# Patient Record
Sex: Female | Born: 1935 | Race: Black or African American | Hispanic: No | Smoking: Never smoker
Health system: Southern US, Community
[De-identification: ages and names within clinical notes are randomized; demographics above are authoritative.]

## PROBLEM LIST (undated history)

## (undated) DIAGNOSIS — D473 Essential (hemorrhagic) thrombocythemia: Secondary | ICD-10-CM

## (undated) DIAGNOSIS — E0590 Thyrotoxicosis, unspecified without thyrotoxic crisis or storm: Secondary | ICD-10-CM

## (undated) DIAGNOSIS — I10 Essential (primary) hypertension: Secondary | ICD-10-CM

## (undated) DIAGNOSIS — F4310 Post-traumatic stress disorder, unspecified: Secondary | ICD-10-CM

## (undated) DIAGNOSIS — G4700 Insomnia, unspecified: Secondary | ICD-10-CM

## (undated) DIAGNOSIS — E669 Obesity, unspecified: Secondary | ICD-10-CM

## (undated) DIAGNOSIS — G43909 Migraine, unspecified, not intractable, without status migrainosus: Secondary | ICD-10-CM

## (undated) DIAGNOSIS — I2699 Other pulmonary embolism without acute cor pulmonale: Secondary | ICD-10-CM

## (undated) DIAGNOSIS — K5790 Diverticulosis of intestine, part unspecified, without perforation or abscess without bleeding: Secondary | ICD-10-CM

## (undated) DIAGNOSIS — M1990 Unspecified osteoarthritis, unspecified site: Secondary | ICD-10-CM

## (undated) DIAGNOSIS — I639 Cerebral infarction, unspecified: Secondary | ICD-10-CM

## (undated) DIAGNOSIS — M129 Arthropathy, unspecified: Secondary | ICD-10-CM

## (undated) HISTORY — PX: TONSILLECTOMY: SHX5217

## (undated) HISTORY — DX: Diverticulosis of intestine, part unspecified, without perforation or abscess without bleeding: K57.90

## (undated) HISTORY — DX: Arthropathy, unspecified: M12.9

## (undated) HISTORY — DX: Obesity, unspecified: E66.9

## (undated) HISTORY — DX: Insomnia, unspecified: G47.00

## (undated) HISTORY — DX: Essential (primary) hypertension: I10

## (undated) HISTORY — PX: BREAST SURGERY: SHX581

## (undated) HISTORY — DX: Thyrotoxicosis, unspecified without thyrotoxic crisis or storm: E05.90

## (undated) HISTORY — DX: Post-traumatic stress disorder, unspecified: F43.10

## (undated) HISTORY — DX: Other pulmonary embolism without acute cor pulmonale: I26.99

## (undated) HISTORY — DX: Migraine, unspecified, not intractable, without status migrainosus: G43.909

## (undated) HISTORY — DX: Cerebral infarction, unspecified: I63.9

---

## 1974-01-05 HISTORY — PX: ABDOMINAL HYSTERECTOMY: SHX81

## 1996-01-06 HISTORY — PX: TOTAL HIP ARTHROPLASTY: SHX124

## 2007-01-06 LAB — HM MAMMOGRAPHY: HM Mammogram: NORMAL

## 2008-02-27 ENCOUNTER — Emergency Department (HOSPITAL_COMMUNITY): Admission: EM | Admit: 2008-02-27 | Discharge: 2008-02-27 | Payer: Self-pay | Admitting: Emergency Medicine

## 2008-04-20 ENCOUNTER — Ambulatory Visit: Payer: Self-pay | Admitting: Internal Medicine

## 2008-04-20 DIAGNOSIS — G43909 Migraine, unspecified, not intractable, without status migrainosus: Secondary | ICD-10-CM | POA: Insufficient documentation

## 2008-04-20 DIAGNOSIS — I10 Essential (primary) hypertension: Secondary | ICD-10-CM | POA: Insufficient documentation

## 2008-04-21 DIAGNOSIS — E669 Obesity, unspecified: Secondary | ICD-10-CM | POA: Insufficient documentation

## 2008-04-21 DIAGNOSIS — M1990 Unspecified osteoarthritis, unspecified site: Secondary | ICD-10-CM | POA: Insufficient documentation

## 2008-06-06 ENCOUNTER — Ambulatory Visit: Payer: Self-pay | Admitting: Internal Medicine

## 2008-06-19 ENCOUNTER — Telehealth: Payer: Self-pay | Admitting: Internal Medicine

## 2008-06-20 ENCOUNTER — Ambulatory Visit: Payer: Self-pay | Admitting: Internal Medicine

## 2008-06-20 LAB — HM COLONOSCOPY

## 2008-08-16 ENCOUNTER — Ambulatory Visit: Payer: Self-pay | Admitting: Internal Medicine

## 2008-08-22 ENCOUNTER — Ambulatory Visit: Payer: Self-pay | Admitting: Internal Medicine

## 2008-08-22 DIAGNOSIS — F4310 Post-traumatic stress disorder, unspecified: Secondary | ICD-10-CM | POA: Insufficient documentation

## 2008-08-23 LAB — CONVERTED CEMR LAB
ALT: 11 units/L (ref 0–35)
AST: 13 units/L (ref 0–37)
Albumin: 3.6 g/dL (ref 3.5–5.2)
Alkaline Phosphatase: 61 units/L (ref 39–117)
BUN: 19 mg/dL (ref 6–23)
Basophils Absolute: 0 10*3/uL (ref 0.0–0.1)
Basophils Relative: 0.3 % (ref 0.0–3.0)
Bilirubin Urine: NEGATIVE
Bilirubin, Direct: 0.1 mg/dL (ref 0.0–0.3)
CO2: 32 meq/L (ref 19–32)
Calcium: 9.3 mg/dL (ref 8.4–10.5)
Chloride: 105 meq/L (ref 96–112)
Cholesterol: 132 mg/dL (ref 0–200)
Creatinine, Ser: 0.6 mg/dL (ref 0.4–1.2)
Eosinophils Absolute: 0.2 10*3/uL (ref 0.0–0.7)
Eosinophils Relative: 4.2 % (ref 0.0–5.0)
GFR calc non Af Amer: 125.68 mL/min (ref >60)
Glucose, Bld: 88 mg/dL (ref 70–99)
HCT: 40.3 % (ref 36.0–46.0)
HDL: 39.2 mg/dL (ref >39.00)
Hemoglobin: 13.4 g/dL (ref 12.0–15.0)
Ketones, ur: NEGATIVE mg/dL
LDL Cholesterol: 86 mg/dL (ref 0–99)
Lymphocytes Relative: 24.5 % (ref 12.0–46.0)
Lymphs Abs: 1.2 10*3/uL (ref 0.7–4.0)
MCHC: 33.2 g/dL (ref 30.0–36.0)
MCV: 90.7 fL (ref 78.0–100.0)
Monocytes Absolute: 0.4 10*3/uL (ref 0.1–1.0)
Monocytes Relative: 8.9 % (ref 3.0–12.0)
Neutro Abs: 3.1 10*3/uL (ref 1.4–7.7)
Neutrophils Relative %: 62.1 % (ref 43.0–77.0)
Nitrite: POSITIVE
Platelets: 289 10*3/uL (ref 150.0–400.0)
Potassium: 4 meq/L (ref 3.5–5.1)
RBC: 4.45 M/uL (ref 3.87–5.11)
RDW: 13.9 % (ref 11.5–14.6)
Sodium: 142 meq/L (ref 135–145)
Specific Gravity, Urine: 1.025 (ref 1.000–1.030)
TSH: 0.53 microintl units/mL (ref 0.35–5.50)
Total Bilirubin: 0.6 mg/dL (ref 0.3–1.2)
Total CHOL/HDL Ratio: 3
Total Protein, Urine: NEGATIVE mg/dL
Total Protein: 6.6 g/dL (ref 6.0–8.3)
Triglycerides: 34 mg/dL (ref 0.0–149.0)
Urine Glucose: NEGATIVE mg/dL
Urobilinogen, UA: 0.2 (ref 0.0–1.0)
VLDL: 6.8 mg/dL (ref 0.0–40.0)
WBC: 4.9 10*3/uL (ref 4.5–10.5)
pH: 5.5 (ref 5.0–8.0)

## 2008-09-04 ENCOUNTER — Ambulatory Visit: Payer: Self-pay | Admitting: Licensed Clinical Social Worker

## 2009-03-25 ENCOUNTER — Ambulatory Visit: Payer: Self-pay | Admitting: Internal Medicine

## 2009-03-25 LAB — CONVERTED CEMR LAB
Bilirubin Urine: NEGATIVE
Hemoglobin, Urine: NEGATIVE
Ketones, ur: NEGATIVE mg/dL
Nitrite: POSITIVE
Specific Gravity, Urine: >=1.03 (ref 1.000–1.030)
Total Protein, Urine: NEGATIVE mg/dL
Urine Glucose: NEGATIVE mg/dL
Urobilinogen, UA: 0.2 (ref 0.0–1.0)
pH: 5.5 (ref 5.0–8.0)

## 2009-04-30 ENCOUNTER — Telehealth: Payer: Self-pay | Admitting: Internal Medicine

## 2009-07-03 ENCOUNTER — Ambulatory Visit: Payer: Self-pay | Admitting: Internal Medicine

## 2009-07-03 DIAGNOSIS — G4700 Insomnia, unspecified: Secondary | ICD-10-CM | POA: Insufficient documentation

## 2009-09-02 ENCOUNTER — Ambulatory Visit: Payer: Self-pay | Admitting: Internal Medicine

## 2009-09-03 LAB — CONVERTED CEMR LAB
ALT: 11 units/L (ref 0–35)
AST: 13 units/L (ref 0–37)
Albumin: 3.7 g/dL (ref 3.5–5.2)
Alkaline Phosphatase: 57 units/L (ref 39–117)
BUN: 17 mg/dL (ref 6–23)
Basophils Absolute: 0.1 10*3/uL (ref 0.0–0.1)
Basophils Relative: 1.4 % (ref 0.0–3.0)
Bilirubin Urine: NEGATIVE
Bilirubin, Direct: 0.1 mg/dL (ref 0.0–0.3)
CO2: 32 meq/L (ref 19–32)
Calcium: 9.5 mg/dL (ref 8.4–10.5)
Chloride: 103 meq/L (ref 96–112)
Creatinine, Ser: 0.8 mg/dL (ref 0.4–1.2)
Eosinophils Absolute: 0.2 10*3/uL (ref 0.0–0.7)
Eosinophils Relative: 3.2 % (ref 0.0–5.0)
GFR calc non Af Amer: 96.87 mL/min (ref >60)
Glucose, Bld: 89 mg/dL (ref 70–99)
HCT: 38.3 % (ref 36.0–46.0)
Hemoglobin, Urine: NEGATIVE
Hemoglobin: 13 g/dL (ref 12.0–15.0)
Ketones, ur: NEGATIVE mg/dL
Lymphocytes Relative: 25.6 % (ref 12.0–46.0)
Lymphs Abs: 1.3 10*3/uL (ref 0.7–4.0)
MCHC: 33.9 g/dL (ref 30.0–36.0)
MCV: 91.7 fL (ref 78.0–100.0)
Monocytes Absolute: 0.4 10*3/uL (ref 0.1–1.0)
Monocytes Relative: 8.5 % (ref 3.0–12.0)
Neutro Abs: 3.2 10*3/uL (ref 1.4–7.7)
Neutrophils Relative %: 61.3 % (ref 43.0–77.0)
Nitrite: NEGATIVE
Platelets: 325 10*3/uL (ref 150.0–400.0)
Potassium: 3.8 meq/L (ref 3.5–5.1)
RBC: 4.18 M/uL (ref 3.87–5.11)
RDW: 15.4 % — ABNORMAL HIGH (ref 11.5–14.6)
Sodium: 143 meq/L (ref 135–145)
Specific Gravity, Urine: >=1.03 (ref 1.000–1.030)
TSH: 0.3 microintl units/mL — ABNORMAL LOW (ref 0.35–5.50)
Total Bilirubin: 0.7 mg/dL (ref 0.3–1.2)
Total Protein, Urine: NEGATIVE mg/dL
Total Protein: 6.7 g/dL (ref 6.0–8.3)
Urine Glucose: NEGATIVE mg/dL
Urobilinogen, UA: 0.2 (ref 0.0–1.0)
WBC: 5.2 10*3/uL (ref 4.5–10.5)
pH: 5 (ref 5.0–8.0)

## 2009-11-06 ENCOUNTER — Ambulatory Visit: Payer: Self-pay | Admitting: Internal Medicine

## 2009-11-07 LAB — CONVERTED CEMR LAB
BUN: 17 mg/dL (ref 6–23)
Basophils Absolute: 0.1 10*3/uL (ref 0.0–0.1)
Basophils Relative: 1.3 % (ref 0.0–3.0)
CO2: 34 meq/L — ABNORMAL HIGH (ref 19–32)
Calcium: 9.8 mg/dL (ref 8.4–10.5)
Chloride: 101 meq/L (ref 96–112)
Creatinine, Ser: 0.6 mg/dL (ref 0.4–1.2)
Eosinophils Absolute: 0.2 10*3/uL (ref 0.0–0.7)
Eosinophils Relative: 4.4 % (ref 0.0–5.0)
GFR calc non Af Amer: 135.64 mL/min (ref >60)
Glucose, Bld: 90 mg/dL (ref 70–99)
HCT: 39.1 % (ref 36.0–46.0)
Hemoglobin: 13.2 g/dL (ref 12.0–15.0)
Lymphocytes Relative: 24.7 % (ref 12.0–46.0)
Lymphs Abs: 1.2 10*3/uL (ref 0.7–4.0)
MCHC: 33.7 g/dL (ref 30.0–36.0)
MCV: 91 fL (ref 78.0–100.0)
Monocytes Absolute: 0.4 10*3/uL (ref 0.1–1.0)
Monocytes Relative: 7.8 % (ref 3.0–12.0)
Neutro Abs: 2.9 10*3/uL (ref 1.4–7.7)
Neutrophils Relative %: 61.8 % (ref 43.0–77.0)
Platelets: 375 10*3/uL (ref 150.0–400.0)
Potassium: 4.3 meq/L (ref 3.5–5.1)
RBC: 4.3 M/uL (ref 3.87–5.11)
RDW: 14.7 % — ABNORMAL HIGH (ref 11.5–14.6)
Sodium: 141 meq/L (ref 135–145)
TSH: 0.25 microintl units/mL — ABNORMAL LOW (ref 0.35–5.50)
WBC: 4.7 10*3/uL (ref 4.5–10.5)

## 2009-11-08 ENCOUNTER — Ambulatory Visit: Payer: Self-pay | Admitting: Cardiology

## 2009-11-11 ENCOUNTER — Ambulatory Visit: Payer: Self-pay | Admitting: Endocrinology

## 2009-11-11 DIAGNOSIS — E0590 Thyrotoxicosis, unspecified without thyrotoxic crisis or storm: Secondary | ICD-10-CM | POA: Insufficient documentation

## 2009-11-19 ENCOUNTER — Encounter: Admission: RE | Admit: 2009-11-19 | Discharge: 2009-11-19 | Payer: Self-pay | Admitting: Endocrinology

## 2010-01-13 ENCOUNTER — Telehealth: Payer: Self-pay | Admitting: Internal Medicine

## 2010-01-13 ENCOUNTER — Ambulatory Visit
Admission: RE | Admit: 2010-01-13 | Discharge: 2010-01-13 | Payer: Self-pay | Source: Home / Self Care | Attending: Internal Medicine | Admitting: Internal Medicine

## 2010-01-15 ENCOUNTER — Telehealth: Payer: Self-pay | Admitting: Internal Medicine

## 2010-01-21 ENCOUNTER — Encounter: Payer: Self-pay | Admitting: Internal Medicine
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
## 2010-02-04 NOTE — Assessment & Plan Note (Signed)
Summary: abd pain/#/cd   Vital Signs:  Patient profile:   77 year old female Height:      64 inches (162.56 cm) Weight:      189.0 pounds (85.91 kg) O2 Sat:      95 % on Room air Temp:     97.9 degrees F (36.61 degrees C) oral Pulse rate:   72 / minute BP sitting:   118 / 74  (left arm) Cuff size:   large  Vitals Entered By: Orlan Leavens RMA (September 02, 2009 1:54 PM)  O2 Flow:  Room air CC: abdominal pain, Abdominal pain Is Patient Diabetic? No Pain Assessment Patient in pain? yes     Location: abdomen Type: aching Comments Also want to discuss trazodone & Pennsaid   Primary Care Provider:  Newt Lukes MD  CC:  abdominal pain and Abdominal pain.  History of Present Illness: here for followup  Osteoarthritis - still with R knee pain also R hip and shoulder pain still seeing dr. Jorge Mandril for same - used topical voltaren gel but has run out - ?pennsaid per friend ?if too much glucosamine can hurt her- intol of relefen trial, ?resume meloxicam as this did not cause stomach upset  obesity - still trying to lose weight but say exercise is hard because of knee and hio oain feels like she is eating less that usual b/c heat/summer but no weight loss  HTN - no problems with her medications - no CP, edema, HA or vision changes - 100% complaint  insomnia - improved with trial amitriptyline and/or trazodone - uses every 2-3 nights as needed - sometimes with "hangover"  Abdominal Pain      This is a 77 year old woman who presents with Abdominal pain.  The symptoms began 2 days ago.  On a scale of 1 to 10, the intensity is described as a 3.  seen for similar pain symptoms 03/2009 -.  The patient reports constipation, but denies nausea, vomiting, diarrhea, hematochezia, and hematemesis.  The location of the pain is right lower quadrant and left lower quadrant.  The pain is described as intermittent and dull.  The patient denies the following symptoms: fever, weight loss, dysuria,  chest pain, and jaundice.  The pain is worse with food.  The pain is better with defecation.    Clinical Review Panels:  Prevention   Last Mammogram:  normal (01/06/2007)   Last Colonoscopy:  Location:  [facility].  (06/20/2008)  CBC   WBC:  4.9 (08/16/2008)   RBC:  4.45 (08/16/2008)   Hgb:  13.4 (08/16/2008)   Hct:  40.3 (08/16/2008)   Platelets:  289.0 (08/16/2008)   MCV  90.7 (08/16/2008)   MCHC  33.2 (08/16/2008)   RDW  13.9 (08/16/2008)   PMN:  62.1 (08/16/2008)   Lymphs:  24.5 (08/16/2008)   Monos:  8.9 (08/16/2008)   Eosinophils:  4.2 (08/16/2008)   Basophil:  0.3 (08/16/2008)  Complete Metabolic Panel   Glucose:  88 (08/16/2008)   Sodium:  142 (08/16/2008)   Potassium:  4.0 (08/16/2008)   Chloride:  105 (08/16/2008)   CO2:  32 (08/16/2008)   BUN:  19 (08/16/2008)   Creatinine:  0.6 (08/16/2008)   Albumin:  3.6 (08/16/2008)   Total Protein:  6.6 (08/16/2008)   Calcium:  9.3 (08/16/2008)   Total Bili:  0.6 (08/16/2008)   Alk Phos:  61 (08/16/2008)   SGPT (ALT):  11 (08/16/2008)   SGOT (AST):  13 (08/16/2008)  Current Medications (verified): 1)  Glucosamine Chondr 500 Complex  Caps (Glucosamine-Chondroit-Vit C-Mn) .... Take 1 Two Times A Day 2)  Hydrochlorothiazide 25 Mg Tabs (Hydrochlorothiazide) .... Take 1 By Mouth Qd 3)  Propranolol Hcl 20 Mg Tabs (Propranolol Hcl) .... Take 1 Two Times A Day 4)  Meloxicam 7.5 Mg Tabs (Meloxicam) .[name].. 1 By Mouth Two Times A Day 5)  Amitriptyline Hcl 10 Mg Tabs (Amitriptyline Hcl) .[name].. 1 By Mouth At Bedtime 6)  Trazodone Hcl 50 Mg Tabs (Trazodone Hcl) .... 1/2 -1 By Mouth At As Needed For Sleep 7)  Pennsaid 1.5 % Soln (Diclofenac Sodium) .... Apply Few Drops To Affected Joints (Knee) Four Times A Day As Needed For Pain  Allergies (verified): No Known Drug Allergies  Past History:  Past Medical History: Hypertension Osteoarthritis    MD roster: sports med - Jorge Mandril  Past Surgical History: Total hip  replacement, right (1998)  Hysterectomy (1976) Tonsillectomy Breast biopsy   Review of Systems  The patient denies fever, weight loss, chest pain, dyspnea on exertion, peripheral edema, melena, and severe indigestion/heartburn.    Physical Exam  General:  alert, well-developed, well-nourished, and cooperative to examination.    Lungs:  normal respiratory effort, no intercostal retractions or use of accessory muscles; normal breath sounds bilaterally - no crackles and no wheezes.    Heart:  normal rate, regular rhythm, no murmur, and no rub. BLE without edema. Abdomen:  obese, soft, non-tender, normal bowel sounds, no distention; no masses and no appreciable hepatomegaly or splenomegaly.   Msk:  R knee with no effusion, not warm or tender to touch. crepitus but FROM. R shoulder: Full range of motion. full strength with testing rotator cuff. Negative impingement signs. mildly tender to palpation over deltoid insertion. Neurovascularly intact  Neurologic:  alert & oriented X3 and cranial nerves II-XII symetrically intact.  strength normal in all extremities, sensation intact to light touch, and gait normal. speech fluent without dysarthria or aphasia; follows commands with good comprehension.    Impression & Recommendations:  Problem # 1:  ABDOMINAL PAIN, GENERALIZED (ICD-789.07)  pain resolved today - exac by food - ?biliary or ischemic - check labs now - if unremarkable, refer for CT and/or angio   Orders: TLB-BMP (Basic Metabolic Panel-BMET) (80048-METABOL) TLB-CBC Platelet - w/Differential (85025-CBCD) TLB-Hepatic/Liver Function Pnl (80076-HEPATIC) TLB-TSH (Thyroid Stimulating Hormone) (84443-TSH) TLB-Udip w/ Micro (81001-URINE)  Problem # 2:  INSOMNIA (ICD-780.52)  continue low dose trazadone - prev intol of elavil  Problem # 3:  OSTEOARTHRITIS (ICD-715.90)  Her updated medication list for this problem includes:    Meloxicam 7.5 Mg Tabs (Meloxicam) .[name]... 1 by mouth two times  a day  affects knee, hip and shoulder resume meloxicam and increase to max allowed dose - also given sample of topical NSAID (pennsaid) to try - cont to follow also with sports med doc Hilts as ongong as needed   Complete Medication List: 1)  Glucosamine Chondr 500 Complex Caps (Glucosamine-chondroit-vit c-mn) .... Take 1 two times a day 2)  Hydrochlorothiazide 25 Mg Tabs (Hydrochlorothiazide) .... Take 1 by mouth qd 3)  Propranolol Hcl 20 Mg Tabs (Propranolol hcl) .... Take 1 two times a day 4)  Meloxicam 7.5 Mg Tabs (Meloxicam) .[name].. 1 by mouth two times a day 5)  Trazodone Hcl 50 Mg Tabs (Trazodone hcl) .[name].. 1 by mouth after dinner as needed for sleep 6)  Pennsaid 1.5 % Soln (Diclofenac sodium) .... Apply few drops to affected joints (knee) four times a day as  needed for pain  Patient Instructions: 1)  it was good to see you today. 2)  test(s) ordered today - your results will be posted on the phone tree for review in 48-72 hours from the time of test completion; call [phone] and enter your 9 digit MRN (listed above on this page, just below your name); if any changes need to be made or there are abnormal results, you will be contacted directly. 3)  continue meloxicam to 15mg /day (2 - 7.5mg  tabs daily) for arthritis pain - also ok to use Pennsaid drops as discussed - sample given - let us know if presciption needed - followup with dr. Jorge Mandril as planned 4)  try trazodone for sleep - take after dinner 5)  Please schedule a follow-up appointment in 4-6 months, sooner if problems.

## 2010-02-04 NOTE — Progress Notes (Signed)
Summary: COL prep ?'s  Phone Note Call from Patient Call back at Home Phone [phone]   Caller: Patient Call For: Dr. Marina Goodell Reason for Call: Talk to Nurse Details for Reason: COL prep ?'s Summary of Call: pt has questions regarding the time she should start her prep Initial call taken by: Vallarie Mare,  June 19, 2008 10:52 AM  Follow-up for Phone Call        Pt. had questions regarding her prep times.  Wanted to double check regarding when to start today and tomorrow's time. She has been informed, and stated that she would see [place] tomorrow. Follow-up by: Clide Cliff RN,  June 19, 2008 11:19 AM

## 2010-02-04 NOTE — Assessment & Plan Note (Signed)
Summary: discuss bp,arthritis/Cd   Vital Signs:  Patient profile:   77 year old female Height:      64 inches (162.56 cm) Weight:      190.4 pounds (86.55 kg) O2 Sat:      99 % on Room air Temp:     97.7 degrees F (36.50 degrees C) oral Pulse rate:   71 / minute BP sitting:   120 / 76  (left arm) Cuff size:   large  Vitals Entered By: Orlan Leavens (July 03, 2009 10:10 AM)  O2 Flow:  Room air CC: Arthritis Is Patient Diabetic? No Pain Assessment Patient in pain? yes     Location: knees Type: aching Comments Pt states she want to discuss med. Was given Nabumetone 500 1-2 two times a day had to stop taking med due to side effects. was making her whole bodyache. also wan to see if md would replace amitriptyline to something else.    Primary Care Provider:  Newt Lukes MD  CC:  Arthritis.  History of Present Illness: her for followup  Osteoarthritis - still with R knee pain now also R hipand shoulder pain still seeing dr. Jorge Mandril for same - used topical voltaren gel but has run out - ?pennsaid per friend ?if too much glucosamine can hurt her- intol of relefen trial, ?resume meloxicam as this did not cause stomach upset  obesity - still trying to lose weight but say exercise is hard because of knee and hio oain feels like she is eating less that usual b/c heat/summer but no weight loss  HTN - no problems with her medications - no CP, edema, HA or vision changes - 100% complaint  insomnia - improved with trial amitriptyline - uses every 2-3 nights as needed - sometimes with "hangover" - ?alt med to try  Current Medications (verified): 1)  Glucosamine Chondr 500 Complex  Caps (Glucosamine-Chondroit-Vit C-Mn) .... Take 1 Two Times A Day 2)  Hydrochlorothiazide 25 Mg Tabs (Hydrochlorothiazide) .... Take 1 By Mouth Qd 3)  Propranolol Hcl 20 Mg Tabs (Propranolol Hcl) .... Take 1 Two Times A Day 4)  Meloxicam 7.5 Mg Tabs (Meloxicam) .... Take 1 By Mouth Qd 5)   Amitriptyline Hcl 10 Mg Tabs (Amitriptyline Hcl) .[name].. 1 By Mouth At Bedtime  Allergies (verified): No Known Drug Allergies  Past History:  Past Medical History: Hypertension Osteoarthritis  MD roster: sports med - Hiltz  Review of Systems  The patient denies fever, chest pain, syncope, and peripheral edema.    Physical Exam  General:  alert, well-developed, well-nourished, and cooperative to examination.    Lungs:  normal respiratory effort, no intercostal retractions or use of accessory muscles; normal breath sounds bilaterally - no crackles and no wheezes.    Heart:  normal rate, regular rhythm, no murmur, and no rub. BLE without edema. Msk:  R knee with no effusion, not warm or tender to touch. crepitus but FROM.   Impression & Recommendations:  Problem # 1:  OSTEOARTHRITIS (ICD-715.90)  resume meloxicam and increase to max allowed dose - also given sample of topical NSAID (pennsaid) to try - cont to follow also with sports med doc Hilts as ongong as needed  The following medications were removed from the medication list:    Hydrocodone-acetaminophen 10-325 Mg Tabs (Hydrocodone-acetaminophen) .[name]... Take 1 q 4-6hours as needed    Arthrotec 75-200 Mg-mcg Tabs (Diclofenac-misoprostol) .[name]... Take 1 two times a day Her updated medication list for this problem includes:  Meloxicam 7.5 Mg Tabs (Meloxicam) .[name]... 1 by mouth two times a day  Orders: Prescription Created Electronically [phone])  Problem # 2:  HYPERTENSION (ICD-401.9) Assessment: Unchanged  Her updated medication list for this problem includes:    Hydrochlorothiazide 25 Mg Tabs (Hydrochlorothiazide) .[name]... Take 1 by mouth qd    Propranolol Hcl 20 Mg Tabs (Propranolol hcl) .[name]... Take 1 two times a day  BP today: 120/76 Prior BP: 112/82 (03/25/2009)  Labs Reviewed: K+: 4.0 (08/16/2008) Creat: : 0.6 (08/16/2008)   Chol: 132 (08/16/2008)   HDL: 39.20 (08/16/2008)   LDL: 86 (08/16/2008)   TG: 34.0  (08/16/2008)  Problem # 3:  INSOMNIA (ICD-780.52)  try low dose trazadone in place of elavil as needed - new erx done  Discussed sleep hygiene.   Orders: Prescription Created Electronically ([phone])  Complete Medication List: 1)  Glucosamine Chondr 500 Complex Caps (Glucosamine-chondroit-vit c-mn) .... Take 1 two times a day 2)  Hydrochlorothiazide 25 Mg Tabs (Hydrochlorothiazide) .... Take 1 by mouth qd 3)  Propranolol Hcl 20 Mg Tabs (Propranolol hcl) .... Take 1 two times a day 4)  Meloxicam 7.5 Mg Tabs (Meloxicam) .[name].. 1 by mouth two times a day 5)  Amitriptyline Hcl 10 Mg Tabs (Amitriptyline hcl) .[name].. 1 by mouth at bedtime 6)  Trazodone Hcl 50 Mg Tabs (Trazodone hcl) .... 1/2 -1 by mouth at as needed for sleep 7)  Pennsaid 1.5 % Soln (Diclofenac sodium) .... Apply few drops to affected joints (knee) four times a day as needed for pain  Patient Instructions: 1)  it was good to see you today. 2)  increase meloxicam to 15mg /day (2 - 7.5mg  tabs daily) for arthritis pain - 3)  also ok to try Pennsaid drops on knee as discussed - sample given - let us know if presciption needed 4)  try trazodone for sleep in place of amitriptyline -  5)  your prescriptions have been electronically submitted to your pharmacy. Please take as directed. Contact our office if you believe you're having problems with the medication(s).  6)  Please schedule a follow-up appointment in 4-6 months, sooner if problems.  Prescriptions: TRAZODONE HCL 50 MG TABS (TRAZODONE HCL) 1/2 -1 by mouth at as needed for sleep  #30 x 1   Entered and Authorized by:   Newt Lukes MD   Signed by:   Newt Lukes MD on 07/03/2009   Method used:   [facility]        [facility] [address] [phone]* (retail)       [address]       [place], [place]  [address]       Ph: [phone] or [phone]       Fax: [phone]   RxID:   [id number] MELOXICAM 7.5 MG TABS (MELOXICAM) 1 by mouth  two times a day  #60 x 1   Entered and Authorized by:   Newt Lukes MD   Signed by:   Newt Lukes MD on 07/03/2009   Method used:   [facility]ically to        [facility] [address] [phone]* (retail)       [address]       [place], [place]  [address]       Ph: [phone] or [phone]       Fax: [phone]   RxID:   [id number]

## 2010-02-04 NOTE — Progress Notes (Signed)
Summary: HCTZ & propanolol  Phone Note Refill Request Message from:  Fax from Pharmacy on April 30, 2009 10:10 AM  Refills Requested: Medication #1:  HYDROCHLOROTHIAZIDE 25 MG TABS take 1 by mouth qd   Last Refilled: 04/08/2009  Medication #2:  PROPRANOLOL HCL 20 MG TABS take 1 two times a day   Last Refilled: 03/27/2009  Method Requested: Electronic Initial call taken by: Orlan Leavens,  April 30, 2009 10:10 AM    Prescriptions: PROPRANOLOL HCL 20 MG TABS (PROPRANOLOL HCL) take 1 two times a day  #60 x 5   Entered by:   Orlan Leavens   Authorized by:   Newt Lukes MD   Signed by:   Orlan Leavens on 04/30/2009   Method used:   Electronically to        [facility] [address] [phone]* (retail)       [address]       [place], [place]  [address]       Ph: [phone] or [phone]       Fax: [phone]   RxID:   [id number] HYDROCHLOROTHIAZIDE 25 MG TABS (HYDROCHLOROTHIAZIDE) take 1 by mouth qd  #30 x 5   Entered by:   Orlan Leavens   Authorized by:   Newt Lukes MD   Signed by:   Orlan Leavens on 04/30/2009   Method used:   [facility]ronically to        [facility] [address] [phone]* (retail)       [address]       [place], [place]  [address]       Ph: [phone] or [phone]       Fax: [phone]   RxID:   [id number]

## 2010-02-04 NOTE — Assessment & Plan Note (Signed)
Summary: NEW ENDO HYPERTHYROID MEDICARE PT-PER LUCY/DR VL-#--STC   Vital Signs:  Patient profile:   77 year old female Height:      64 inches (162.56 cm) Weight:      187 pounds (85 kg) BMI:     32.21 O2 Sat:      97 % on Room air Temp:     98.5 degrees F (36.94 degrees C) oral Pulse rate:   70 / minute BP sitting:   122 / 72  (left arm) Cuff size:   large  Vitals Entered By: [name] CMA Duncan Dull) (November 11, 2009 2:22 PM)  O2 Flow:  Room air CC: New Endo/Hyperthyroid/Dr. Leschber/aj Is Patient Diabetic? No   Primary Provider:  Newt Lukes MD  CC:  New Endo/Hyperthyroid/Dr. Leschber/aj.  History of Present Illness: pt was incidentally noted on labs to have abnormal tsh.   in retrospect, she has few mos of slight pain in the abdomen, but no assoc n/v.  Current Medications (verified): 1)  Glucosamine Chondr 500 Complex  Caps (Glucosamine-Chondroit-Vit C-Mn) .... Take 1 Two Times A Day 2)  Hydrochlorothiazide 25 Mg Tabs (Hydrochlorothiazide) .... Take 1 By Mouth Qd 3)  Propranolol Hcl 20 Mg Tabs (Propranolol Hcl) .... Take 1 Two Times A Day 4)  Meloxicam 7.5 Mg Tabs (Meloxicam) .[name].. 1 By Mouth Two Times A Day 5)  Trazodone Hcl 50 Mg Tabs (Trazodone Hcl) .[name].. 1 By Mouth After Dinner As Needed For Sleep 6)  Align 4 Mg Caps (Probiotic Product) .... Take 1 By Mouth Once Daily  Allergies (verified): No Known Drug Allergies  Past History:  Past Medical History: Last updated: 11/06/2009 Hypertension Osteoarthritis     MD roster: sports med - [facility]  Review of Systems       denies weight loss, headache, hoarseness, double vision, palpitations, sob, diarrhea, polyuria, excessive diaphoresis, numbness, tremor, anxiety, menopausal sxs, and rhinorrhea.  she has chronic arthralgias, and easy bruising.     Physical Exam  General:  normal appearance.   Head:  head: no deformity eyes: no periorbital swelling, but there is slight bilateral proptosis. external nose  and ears are normal mouth: no lesion seen Neck:  the thyroid is 2-3x normal size, and larger on the right than the left.  i am unable to appreciaate a nodule. Lungs:  Clear to auscultation bilaterally. Normal respiratory effort.  Heart:  Regular rate and rhythm without murmurs or gallops noted. Normal S1,S2.   Msk:  muscle bulk and strength are grossly normal.  no obvious joint swelling.  gait is normal and steady  Extremities:  no deformity, except for oa changes no edema Neurologic:  cn 2-12 grossly intact.   readily moves all 4's.   sensation is intact to touch on all 4's Skin:  normal texture and temp.  no rash is seen.  not diaphoretic  Cervical Nodes:  No significant adenopathy.  Psych:  Alert and cooperative; normal mood and affect; normal attention span and concentration.   Additional Exam:  FastTSH       0.25 uIU/mL    Impression & Recommendations:  Problem # 1:  HYPERTHYROIDISM (ICD-242.90) uncertain etiology mild  Problem # 2:  ABDOMINAL PAIN, GENERALIZED (ICD-789.07) we need to allow time for the pt to excrete the iodine from the ct contrast prior to any nuc med dx or rx  Problem # 3:  POSTTRAUMATIC STRESS DISORDER (ICD-309.81) this could limit interpretation of sxs, but she is doing well in this regard now  Other Orders: Radiology Referral (Radiology) Est. Patient Level V (16109)  Patient Instructions: 1)  check thyroid ultrasound.  please call [phone] to hear your test results. 2)  Please schedule a follow-up appointment in 2-3 months.  this is because we need to allow time for the iodine from your ct scan to leave your body.  then we'll be able to treat the thyroid, usually with a radioactive iodine pill.   this would be your choice, but it is what i would do if i was you.     Orders Added: 1)  Radiology Referral [Radiology] 2)  Est. Patient Level V [81191]

## 2010-02-04 NOTE — Assessment & Plan Note (Signed)
Summary: stomach pains/cd   Vital Signs:  Patient profile:   77 year old female Height:      64 inches (162.56 cm) Weight:      190.0 pounds (86.36 kg) BMI:     32.73 O2 Sat:      96 % on Room air Temp:     98.3 degrees F (36.83 degrees C) oral Pulse rate:   74 / minute BP sitting:   112 / 82  (left arm) Cuff size:   large  Vitals Entered By: Orlan Leavens (March 25, 2009 11:16 AM)  O2 Flow:  Room air CC: stomach cramps, Abdominal pain Is Patient Diabetic? No Pain Assessment Patient in pain? yes     Location: stomach Type: cramping   Primary Care Provider:  Newt Lukes MD  CC:  stomach cramps and Abdominal pain.  History of Present Illness:  Abdominal Pain      This is a 77 year old woman who presents with Abdominal pain.  The symptoms began 4-8 weeks ago.  On a scale of 1 to 10, the intensity is described as a 4.  occurs once every 3-4 days, lasts from 5-30 minutes per spell.  The patient reports constipation, but denies nausea, vomiting, melena, hematochezia, and anorexia.  The location of the pain is diffuse.  The pain is described as intermittent and cramping in quality.  The patient denies the following symptoms: fever, weight loss, and dysuria.  The pain is worse with food.  The pain is better with defecation.    still with R knee pain now also R hip pain still seeing dr. Jorge Mandril for same - using topical voltaren gel and as needed hydrocodone 10 for same symptoms ?if too much glucosm  obesity - still trying to lose weight but say exercise is hard because of knee and hio oain feels like she is eating less that usual b/c heat/summer but no weight loss  HTN - no problems with her medications - no CP or HA or vision changes - 100% complaint  insomnia improved with trial amitriptyline - uses every 2-3 nights as needed   Clinical Review Panels:  Prevention   Last Mammogram:  normal (01/06/2007)   Last Colonoscopy:  Location:  [facility].   (06/20/2008)  Lipid Management   Cholesterol:  132 (08/16/2008)   LDL (bad choesterol):  86 (08/16/2008)   HDL (good cholesterol):  39.20 (08/16/2008)  CBC   WBC:  4.9 (08/16/2008)   RBC:  4.45 (08/16/2008)   Hgb:  13.4 (08/16/2008)   Hct:  40.3 (08/16/2008)   Platelets:  289.0 (08/16/2008)   MCV  90.7 (08/16/2008)   MCHC  33.2 (08/16/2008)   RDW  13.9 (08/16/2008)   PMN:  62.1 (08/16/2008)   Lymphs:  24.5 (08/16/2008)   Monos:  8.9 (08/16/2008)   Eosinophils:  4.2 (08/16/2008)   Basophil:  0.3 (08/16/2008)  Complete Metabolic Panel   Glucose:  88 (08/16/2008)   Sodium:  142 (08/16/2008)   Potassium:  4.0 (08/16/2008)   Chloride:  105 (08/16/2008)   CO2:  32 (08/16/2008)   BUN:  19 (08/16/2008)   Creatinine:  0.6 (08/16/2008)   Albumin:  3.6 (08/16/2008)   Total Protein:  6.6 (08/16/2008)   Calcium:  9.3 (08/16/2008)   Total Bili:  0.6 (08/16/2008)   Alk Phos:  61 (08/16/2008)   SGPT (ALT):  11 (08/16/2008)   SGOT (AST):  13 (08/16/2008)   Current Medications (verified): 1)  Glucosamine Chondr 500 Complex  Caps (Glucosamine-Chondroit-Vit C-Mn) .... Take 1 Two Times A Day 2)  Hydrochlorothiazide 25 Mg Tabs (Hydrochlorothiazide) .... Take 1 By Mouth Qd 3)  Propranolol Hcl 20 Mg Tabs (Propranolol Hcl) .... Take 1 Two Times A Day 4)  Meloxicam 7.5 Mg Tabs (Meloxicam) .... Take 1 By Mouth Qd 5)  Hydrocodone-Acetaminophen 10-325 Mg Tabs (Hydrocodone-Acetaminophen) .... Take 1 Q 4-6hours As Needed 6)  Amitriptyline Hcl 10 Mg Tabs (Amitriptyline Hcl) .[name].. 1 By Mouth At Bedtime 7)  Arthrotec 75-200 Mg-Mcg Tabs (Diclofenac-Misoprostol) .... Take 1 Two Times A Day  Allergies (verified): No Known Drug Allergies  Past History:  Past Medical History: Hypertension Osteoarthritis  MD rooster: sports med - [facility]  Review of Systems  The patient denies anorexia, weight loss, chest pain, headaches, melena, hematochezia, and severe indigestion/heartburn.    Physical  Exam  General:  alert, well-developed, well-nourished, and cooperative to examination.    Lungs:  normal respiratory effort, no intercostal retractions or use of accessory muscles; normal breath sounds bilaterally - no crackles and no wheezes.    Heart:  normal rate, regular rhythm, no murmur, and no rub. BLE without edema. Abdomen:  obese, soft, non-tender, normal bowel sounds, no distention; no masses and no appreciable hepatomegaly or splenomegaly.     Impression & Recommendations:  Problem # 1:  ABDOMINAL PAIN, GENERALIZED (ICD-789.07)  exam benign - ?related to IBS - nontoxic, afeb - pt declines blood work - will check UA/Ucx given hx UTI - tx if + also add probiotic -  education an dreassurance provided - Time spent with patient 27 minutes, more than 50% of this time was spent counseling patient on causes for abd pain and its treatment options Orders: TLB-Udip w/ Micro (81001-URINE) T-Urine Culture (Spectrum Order) [phone])  Discussed symptom control with the patient.   Problem # 2:  HYPERTENSION (ICD-401.9)  Her updated medication list for this problem includes:    Hydrochlorothiazide 25 Mg Tabs (Hydrochlorothiazide) .[name]... Take 1 by mouth qd    Propranolol Hcl 20 Mg Tabs (Propranolol hcl) .[name]... Take 1 two times a day  BP today: 112/82 Prior BP: 112/82 (08/22/2008)  Labs Reviewed: K+: 4.0 (08/16/2008) Creat: : 0.6 (08/16/2008)   Chol: 132 (08/16/2008)   HDL: 39.20 (08/16/2008)   LDL: 86 (08/16/2008)   TG: 34.0 (08/16/2008)  Problem # 3:  ARTHRITIS (ICD-716.90) cont mgmt as ongoin with [facility] - rreassurance re: glucosamine saftey - trial arthrotech  Complete Medication List: 1)  Glucosamine Chondr 500 Complex Caps (Glucosamine-chondroit-vit c-mn) .... Take 1 two times a day 2)  Hydrochlorothiazide 25 Mg Tabs (Hydrochlorothiazide) .... Take 1 by mouth qd 3)  Propranolol Hcl 20 Mg Tabs (Propranolol hcl) .... Take 1 two times a day 4)  Meloxicam 7.5 Mg Tabs  (Meloxicam) .... Take 1 by mouth qd 5)  Hydrocodone-acetaminophen 10-325 Mg Tabs (Hydrocodone-acetaminophen) .... Take 1 q 4-6hours as needed 6)  Amitriptyline Hcl 10 Mg Tabs (Amitriptyline hcl) .[name].. 1 by mouth at bedtime 7)  Arthrotec 75-200 Mg-mcg Tabs (Diclofenac-misoprostol) .... Take 1 two times a day 8)  Align Caps (Probiotic product) .[name].. 1 by mouth once daily  Patient Instructions: 1)  it was good to see you today. 2)  test(s) ordered today - your results will be posted on the phone tree for review in 48-72 hours from the time of test completion; call [phone] and enter your 9 digit MRN (listed above on this page, just below your name); if any changes need to be made or there are abnormal results,  you will be contacted directly.  3)  start Align once daily for your stomach  -coupon given 4)  Please schedule a follow-up appointment in 4-6 months, sooner if problems.

## 2010-02-04 NOTE — Assessment & Plan Note (Signed)
Summary: 4-6 MTH FU---STC   Vital Signs:  Patient profile:   76 year old female Height:      64 inches (162.56 cm) Weight:      186.8 pounds (84.91 kg) O2 Sat:      96 % on Room air Temp:     98.1 degrees F (36.72 degrees C) oral Pulse rate:   74 / minute BP sitting:   120 / 82  (left arm) Cuff size:   large  Vitals Entered By: Orlan Leavens RMA (November 06, 2009 10:45 AM)  O2 Flow:  Room air CC: 4 month follow-up Is Patient Diabetic? No Pain Assessment Patient in pain? yes     Location: stomach Type: aching/cramping   Primary Care Provider:  Newt Lukes MD  CC:  4 month follow-up.  History of Present Illness: here for followup  Osteoarthritis - still with R knee pain, R hip and shoulder pain still seeing dr. Jorge Mandril for same - recent shot "no good" prev used topical voltaren gel but ran out - now on pennsaid as needed  ?if too much glucosamine can hurt her- intol of relefen trial, taking meloxicam without stomach upset  obesity - still trying to lose weight but say exercise is hard because of knee and hip pain feels like she is eating less that usual b/c stomach pain but no weight loss  HTN - no problems with her medications - no CP, edema, HA or vision changes - 100% complaint  insomnia - improved with trial amitriptyline and/or trazodone - uses every 2-3 nights as needed - sometimes with "hangover"  continued Abdominal Pain - seen 09/02/2009 for same -The symptoms began >2 months ago.  On a scale of 1 to 10, the intensity is described as a 5 when present, only 2 now (resolving attack from this AM).  seen for similar pain symptoms 03/2009 -  The patient reports constipation, but denies nausea, vomiting, diarrhea, hematochezia, and hematemesis.  The location of the pain is right lower quadrant and left lower quadrant and epigastric region.  The pain is described as intermittent, cramping and dull.  The patient denies the following symptoms: fever, weight loss,  dysuria, chest pain, and jaundice.  The pain is worse with food.  The pain is better with defecation.     Clinical Review Panels:  Lipid Management   Cholesterol:  132 (08/16/2008)   LDL (bad choesterol):  86 (08/16/2008)   HDL (good cholesterol):  39.20 (08/16/2008)  CBC   WBC:  5.2 (09/02/2009)   RBC:  4.18 (09/02/2009)   Hgb:  13.0 (09/02/2009)   Hct:  38.3 (09/02/2009)   Platelets:  325.0 (09/02/2009)   MCV  91.7 (09/02/2009)   MCHC  33.9 (09/02/2009)   RDW  15.4 (09/02/2009)   PMN:  61.3 (09/02/2009)   Lymphs:  25.6 (09/02/2009)   Monos:  8.5 (09/02/2009)   Eosinophils:  3.2 (09/02/2009)   Basophil:  1.4 (09/02/2009)  Complete Metabolic Panel   Glucose:  89 (09/02/2009)   Sodium:  143 (09/02/2009)   Potassium:  3.8 (09/02/2009)   Chloride:  103 (09/02/2009)   CO2:  32 (09/02/2009)   BUN:  17 (09/02/2009)   Creatinine:  0.8 (09/02/2009)   Albumin:  3.7 (09/02/2009)   Total Protein:  6.7 (09/02/2009)   Calcium:  9.5 (09/02/2009)   Total Bili:  0.7 (09/02/2009)   Alk Phos:  57 (09/02/2009)   SGPT (ALT):  11 (09/02/2009)   SGOT (AST):  13 (09/02/2009)  Current Medications (verified): 1)  Glucosamine Chondr 500 Complex  Caps (Glucosamine-Chondroit-Vit C-Mn) .... Take 1 Two Times A Day 2)  Hydrochlorothiazide 25 Mg Tabs (Hydrochlorothiazide) .... Take 1 By Mouth Qd 3)  Propranolol Hcl 20 Mg Tabs (Propranolol Hcl) .... Take 1 Two Times A Day 4)  Meloxicam 7.5 Mg Tabs (Meloxicam) .[name].. 1 By Mouth Two Times A Day 5)  Trazodone Hcl 50 Mg Tabs (Trazodone Hcl) .[name].. 1 By Mouth After Dinner As Needed For Sleep  Allergies (verified): No Known Drug Allergies  Past History:  Past Medical History: Hypertension Osteoarthritis     MD roster: sports med - [facility]  Review of Systems  The patient denies chest pain, headaches, melena, and hematochezia.    Physical Exam  General:  alert, well-developed, well-nourished, and cooperative to examination.    Lungs:  normal  respiratory effort, no intercostal retractions or use of accessory muscles; normal breath sounds bilaterally - no crackles and no wheezes.    Heart:  normal rate, regular rhythm, no murmur, and no rub. BLE without edema. Abdomen:  obese, soft, non-tender, normal bowel sounds, no distention; no masses and no appreciable hepatomegaly or splenomegaly.     Impression & Recommendations:  Problem # 1:  ABDOMINAL PAIN, GENERALIZED (ICD-789.07)  Orders: TLB-CBC Platelet - w/Differential (85025-CBCD) TLB-BMP (Basic Metabolic Panel-BMET) (80048-METABOL) Ketorolac-Toradol 15mg  (Z6109) Admin of Therapeutic Inj  intramuscular or subcutaneous (60454) Misc. Referral (Misc. Ref)  pain resolved today - exac by food - ?biliary or ischemic - recheck labs now (prev normal 09/02/09) -  as unremarkable labs with cont symptoms, refer now for CT now and consider angio as needed   Orders: TLB-BMP (Basic Metabolic Panel-BMET) (80048-METABOL) TLB-CBC Platelet - w/Differential (85025-CBCD) TLB-Hepatic/Liver Function Pnl (80076-HEPATIC) TLB-TSH (Thyroid Stimulating Hormone) (84443-TSH) TLB-Udip w/ Micro (81001-URINE)  Problem # 2:  THYROID STIMULATING HORMONE, ABNORMAL (ICD-246.9) mild suppressed last check 8/29 - recheck now Orders: TLB-TSH (Thyroid Stimulating Hormone) (84443-TSH)  Problem # 3:  HYPERTENSION (ICD-401.9)  Her updated medication list for this problem includes:    Hydrochlorothiazide 25 Mg Tabs (Hydrochlorothiazide) .[name]... Take 1 by mouth qd    Propranolol Hcl 20 Mg Tabs (Propranolol hcl) .[name]... Take 1 two times a day  BP today: 120/82 Prior BP: 118/74 (09/02/2009)  Labs Reviewed: K+: 3.8 (09/02/2009) Creat: : 0.8 (09/02/2009)   Chol: 132 (08/16/2008)   HDL: 39.20 (08/16/2008)   LDL: 86 (08/16/2008)   TG: 34.0 (08/16/2008)  Complete Medication List: 1)  Glucosamine Chondr 500 Complex Caps (Glucosamine-chondroit-vit c-mn) .... Take 1 two times a day 2)  Hydrochlorothiazide 25 Mg Tabs  (Hydrochlorothiazide) .... Take 1 by mouth qd 3)  Propranolol Hcl 20 Mg Tabs (Propranolol hcl) .... Take 1 two times a day 4)  Meloxicam 7.5 Mg Tabs (Meloxicam) .[name].. 1 by mouth two times a day 5)  Trazodone Hcl 50 Mg Tabs (Trazodone hcl) .[name].. 1 by mouth after dinner as needed for sleep  Patient Instructions: 1)  it was good to see you today. 2)  test(s) ordered today - your results will be posted on the phone tree for review in 48-72 hours from the time of test completion; call [phone] and enter your 9 digit MRN (listed above on this page, just below your name); if any changes need to be made or there are abnormal results, you will be contacted directly.we'll make referral for CT scan of abdomen and pelvis to look for causes of pain Our office will contact you regarding this appointment once made.  Then we  will call you with results after report reviewed 3)  Tordol shot for stomach pain today 4)  refill on HCTZ done 5)  Please schedule a follow-up appointment in 4-6 months, sooner if problems.  Prescriptions: HYDROCHLOROTHIAZIDE 25 MG TABS (HYDROCHLOROTHIAZIDE) take 1 by mouth qd  #30 x 6   Entered and Authorized by:   Newt Lukes MD   Signed by:   Newt Lukes MD on 11/06/2009   Method used:   Electronically to        [facility] [address] [phone]* (retail)       [address]       [place], [place]  [address]       Ph: [phone] or [phone]       Fax: [phone]   RxID:   [id number]    Medication Administration  Injection # 1:    Medication: Ketorolac-Toradol 15mg     Diagnosis: ABDOMINAL PAIN, GENERALIZED (ICD-789.07)    Route: IM    Site: LUOQ gluteus    Exp Date: 01/06/2011    Lot #: [id number]    Mfr: Novaplus    Comments: Gave total of 30mg     Patient tolerated injection without complications    Given by: Orlan Leavens RMA (November 06, 2009 11:24 AM)  Orders Added: 1)  TLB-TSH (Thyroid Stimulating Hormone) [84443-TSH] 2)   TLB-CBC Platelet - w/Differential [85025-CBCD] 3)  TLB-BMP (Basic Metabolic Panel-BMET) [80048-METABOL] 4)  Ketorolac-Toradol 15mg  [J1885] 5)  Admin of Therapeutic Inj  intramuscular or subcutaneous [96372] 6)  Misc. Referral [Misc. Ref] 7)  Est. Patient Level IV [29518]

## 2010-02-06 NOTE — Miscellaneous (Signed)
Summary: [facility]  [facility]   Imported By: Lester [place] 01/24/2010 10:50:59  _____________________________________________________________________  External Attachment:    Type:   Image     Comment:   External Document

## 2010-02-06 NOTE — Assessment & Plan Note (Signed)
Summary: follow up-lb   Vital Signs:  Patient profile:   77 year old female Height:      64 inches (162.56 cm) Weight:      184.8 pounds (84 kg) O2 Sat:      97 % on Room air Temp:     98.3 degrees F (36.83 degrees C) oral Pulse rate:   78 / minute BP sitting:   122 / 72  (left arm) Cuff size:   large  Vitals Entered By: Orlan Leavens RMA (January 13, 2010 10:06 AM)  O2 Flow:  Room air CC: follow-up visit Is Patient Diabetic? No Pain Assessment Patient in pain? yes     Location: body Type: aching Comments Pt req to change meloxicam to something else does not help, also have ? about align & glucosamine should she continue taking   Primary Care Provider:  Newt Lukes MD  CC:  follow-up visit.  History of Present Illness:   Osteoarthritis - still with R knee pain, R hip and shoulder pain still seeing dr. Jorge Mandril for same - recent shot "no good" prev used topical voltaren gel but ran out -  ?if too much glucosamine can hurt her- intol of relefen trial due to stomach upset, taking meloxicam but not helpful - ?other meds  obesity - still trying to lose weight but say exercise is hard because of knee and hip pain feels like she is eating less that usual b/c stomach pain but no weight loss  HTN - no problems with her medications - no CP, edema, HA or vision changes - 100% complaint  insomnia - improved with trial amitriptyline and/or trazodone - uses every 2-3 nights as needed - sometimes with "hangover"  hyperthyroid, mild - dx incidental labs 11/2009 - saw endo for same, US done 11/2009 - monitor labs every 3-4 months  intermitt Abdominal Pain  CT AP 11/2009 unremarkable for acute pain abn -The symptoms began >6 months ago.  On a scale of 1 to 10, the intensity is described as a 5 when present, only 2 now (resolving attack from this AM).  seen for similar pain symptoms 03/2009, 08/2009, 11/2009 -  The patient reports constipation, but denies nausea, vomiting, diarrhea,  hematochezia, and hematemesis.  The location of the pain is right lower quadrant and left lower quadrant and epigastric region.  The pain is described as intermittent, cramping and dull.  The patient denies the following symptoms: fever, weight loss, dysuria, chest pain, and jaundice.  The pain is worse with food.  The pain is better with defecation and Align    Current Medications (verified): 1)  Glucosamine Chondr 500 Complex  Caps (Glucosamine-Chondroit-Vit C-Mn) .... Take 1 Two Times A Day 2)  Hydrochlorothiazide 25 Mg Tabs (Hydrochlorothiazide) .... Take 1 By Mouth Qd 3)  Propranolol Hcl 20 Mg Tabs (Propranolol Hcl) .... Take 1 Two Times A Day 4)  Meloxicam 7.5 Mg Tabs (Meloxicam) .[name].. 1 By Mouth Two Times A Day 5)  Trazodone Hcl 50 Mg Tabs (Trazodone Hcl) .[name].. 1 By Mouth After Dinner As Needed For Sleep 6)  Align 4 Mg Caps (Probiotic Product) .... Take 1 By Mouth Once Daily  Allergies (verified): No Known Drug Allergies  Past History:  Past Medical History: Hypertension Osteoarthritis  hyperthyroid    MD roster: [facility] - [facility] -[place]  Review of Systems  The patient denies fever, chest pain, melena, and severe indigestion/heartburn.    Physical Exam  General:  alert, well-developed, well-nourished,  and cooperative to examination.    Lungs:  normal respiratory effort, no intercostal retractions or use of accessory muscles; normal breath sounds bilaterally - no crackles and no wheezes.    Heart:  normal rate, regular rhythm, no murmur, and no rub. BLE without edema. Abdomen:  obese, soft, non-tender, normal bowel sounds, no distention; no masses and no appreciable hepatomegaly or splenomegaly.   Msk:  R knee with no effusion, not warm or tender to touch. crepitus but FROM. R shoulder: limited overhead range of motion and decreased strength with testing rotator cuff. Positive impingement signs. mildly tender to palpation over deltoid insertion. Neurovascularly intact      Impression & Recommendations:  Problem # 1:  OSTEOARTHRITIS (ICD-715.90)  right knee and right shoulder change meloxicam to tramadol  refer to PT and cont to see [facility] ([facility]) as needed  Her updated medication list for this problem includes:    Tramadol Hcl 50 Mg Tabs (Tramadol hcl) .[name]... 1 by mouth three times a day as needed for arthritis pain  Orders: Prescription Created Electronically [phone]) Physical Therapy Referral (PT)  Problem # 2:  HYPERTENSION (ICD-401.9)  Her updated medication list for this problem includes:    Hydrochlorothiazide 25 Mg Tabs (Hydrochlorothiazide) .[name]... Take 1 by mouth qd    Propranolol Hcl 20 Mg Tabs (Propranolol hcl) .[name]... Take 1 two times a day  BP today: 122/72 Prior BP: 122/72 (11/11/2009)  Labs Reviewed: K+: 4.3 (11/06/2009) Creat: : 0.6 (11/06/2009)   Chol: 132 (08/16/2008)   HDL: 39.20 (08/16/2008)   LDL: 86 (08/16/2008)   TG: 34.0 (08/16/2008)  Problem # 3:  HYPERTHYROIDISM (ICD-242.90) mild, detected 11/2009 incidental labs recheck 3 mo, sooner if problems s/p endo eval and [place] - results and OV reviewed Her updated medication list for this problem includes:    Propranolol Hcl 20 Mg Tabs (Propranolol hcl) .[name]... Take 1 two times a day  Complete Medication List: 1)  Glucosamine Chondr 500 Complex Caps (Glucosamine-chondroit-vit c-mn) .... Take 1 two times a day 2)  Hydrochlorothiazide 25 Mg Tabs (Hydrochlorothiazide) .... Take 1 by mouth qd 3)  Propranolol Hcl 20 Mg Tabs (Propranolol hcl) .... Take 1 two times a day 4)  Tramadol Hcl 50 Mg Tabs (Tramadol hcl) .[name].. 1 by mouth three times a day as needed for arthritis pain 5)  Trazodone Hcl 50 Mg Tabs (Trazodone hcl) .[name].. 1 by mouth after dinner as needed for sleep 6)  Align 4 Mg Caps (Probiotic product) .... Take 1 by mouth once daily  Patient Instructions: 1)  it was good to see you today.  2)  change meloxicam to tramadol for pain - your prescription has been electronically  submitted to your pharmacy. Please take as directed. Contact our office if you believe you're having problems with the medication(s).  3)  we'll make referral to physical therapy for arthritis. Our office will contact you regarding this appointment once made.  4)  no other medication changes 5)  Please schedule a follow-up appointment in 3 months to monitor thyroid and arthritis, call sooner if problems.  Prescriptions: TRAMADOL HCL 50 MG TABS (TRAMADOL HCL) 1 by mouth three times a day as needed for arthritis pain  #90 x 1   Entered and Authorized by:   Newt Lukes MD   Signed by:   Newt Lukes MD on 01/13/2010   Method used:   [facility] to        [facility] [address] [phone]* (retail)  [address]       [place], [place]  [address]       Ph: [phone] or [phone]       Fax: [phone]   RxID:   [id number]    Orders Added: 1)  Est. Patient Level IV [87564] 2)  Prescription Created Electronically [[id number]] 3)  Physical Therapy Referral [PT]

## 2010-02-06 NOTE — Progress Notes (Signed)
Summary: coupn expired  Phone Note Call from Patient Call back at Home Phone [phone]   Caller: [phone] Call For: Newt Lukes MD Summary of Call: pt would  like a call from triage  nurse  or lucy  reguarding coupons that she needs will  give details when  the nurse call her back  Initial call taken by: Shelbie Proctor,  January 13, 2010 3:40 PM  Follow-up for Phone Call        Called pt coupons that was given for align was expired. requesting some update ones. Left at front desk for p/u Follow-up by: Orlan Leavens RMA,  January 13, 2010 4:36 PM

## 2010-02-06 NOTE — Progress Notes (Signed)
Summary: ALT med  Phone Note Call from Patient Call back at [facility] Phone [phone]   Caller: Patient Summary of Call: Pt called stating that Tramadol cause dizziness,  blurred vision and nausea. Pt is requesting alternate medication to [facility]. Initial call taken by: Margaret Pyle, CMA,  January 15, 2010 11:27 AM  Follow-up for Phone Call        stop tramadol and resume meloxicam - but take higher dose (15mg  tab once daily) - erx done ; could also try tylenol arthritis bid (with or withour rx med)- thanks Follow-up by: Newt Lukes MD,  January 15, 2010 11:34 AM  Additional Follow-up for Phone Call Additional follow up Details #1::        Pt informed of Rx and MD advisement Additional Follow-up by: Margaret Pyle, CMA,  January 15, 2010 11:39 AM    New/Updated Medications: MELOXICAM 15 MG TABS (MELOXICAM) 1 by mouth once daily as needed for arthritis pain TYLENOL ARTHRITIS PAIN 650 MG CR-TABS (ACETAMINOPHEN) 1 by mouth two times a day Prescriptions: MELOXICAM 15 MG TABS (MELOXICAM) 1 by mouth once daily as needed for arthritis pain  #30 x 3   Entered and Authorized by:   Newt Lukes MD   Signed by:   Newt Lukes MD on 01/15/2010   Method used:   [facility] to        [facility] [address] [phone]* (retail)       [address]       [place], [place]  [address]       Ph: [phone] or [phone]       Fax: [phone]   RxID:   [id number]

## 2010-03-28 ENCOUNTER — Other Ambulatory Visit: Payer: Self-pay | Admitting: Internal Medicine

## 2010-05-29 ENCOUNTER — Encounter: Payer: Self-pay | Admitting: Internal Medicine

## 2010-06-05 ENCOUNTER — Other Ambulatory Visit: Payer: Self-pay | Admitting: Internal Medicine

## 2010-07-22 ENCOUNTER — Encounter: Payer: Self-pay | Admitting: Internal Medicine

## 2010-07-22 ENCOUNTER — Ambulatory Visit (INDEPENDENT_AMBULATORY_CARE_PROVIDER_SITE_OTHER): Payer: Medicare Other | Admitting: Internal Medicine

## 2010-07-22 ENCOUNTER — Telehealth: Payer: Self-pay | Admitting: *Deleted

## 2010-07-22 ENCOUNTER — Other Ambulatory Visit (INDEPENDENT_AMBULATORY_CARE_PROVIDER_SITE_OTHER): Payer: Medicare Other

## 2010-07-22 DIAGNOSIS — I10 Essential (primary) hypertension: Secondary | ICD-10-CM

## 2010-07-22 DIAGNOSIS — Z23 Encounter for immunization: Secondary | ICD-10-CM

## 2010-07-22 DIAGNOSIS — E0590 Thyrotoxicosis, unspecified without thyrotoxic crisis or storm: Secondary | ICD-10-CM

## 2010-07-22 DIAGNOSIS — M1990 Unspecified osteoarthritis, unspecified site: Secondary | ICD-10-CM

## 2010-07-22 LAB — BASIC METABOLIC PANEL
BUN: 22 mg/dL (ref 6–23)
CO2: 32 mEq/L (ref 19–32)
Calcium: 9.2 mg/dL (ref 8.4–10.5)
Chloride: 103 mEq/L (ref 96–112)
Creatinine, Ser: 0.5 mg/dL (ref 0.4–1.2)
GFR: 147.47 mL/min (ref >60.00)
Glucose, Bld: 93 mg/dL (ref 70–99)
Potassium: 3.9 mEq/L (ref 3.5–5.1)
Sodium: 140 mEq/L (ref 135–145)

## 2010-07-22 LAB — LIPID PANEL
Cholesterol: 127 mg/dL (ref 0–200)
HDL: 53.3 mg/dL (ref >39.00)
LDL Cholesterol: 64 mg/dL (ref 0–99)
Total CHOL/HDL Ratio: 2
Triglycerides: 48 mg/dL (ref 0.0–149.0)
VLDL: 9.6 mg/dL (ref 0.0–40.0)

## 2010-07-22 LAB — TSH: TSH: 0.39 u[IU]/mL (ref 0.35–5.50)

## 2010-07-22 LAB — T4, FREE: Free T4: 1.12 ng/dL (ref 0.60–1.60)

## 2010-07-22 NOTE — Assessment & Plan Note (Signed)
BP Readings from Last 3 Encounters:  07/22/10 128/82  01/13/10 122/72  11/11/09 122/72   The current medical regimen is effective;  continue present plan and medications.

## 2010-07-22 NOTE — Telephone Encounter (Signed)
Called pt concerning results from labs. Pt states she fail to mention that she has been tired/no energy wanting to know is there anything she can take or do. Pls advise.Marland KitchenMarland KitchenMarland Kitchen7/17/12@2 :26pm/LMB

## 2010-07-22 NOTE — Assessment & Plan Note (Signed)
Abnormal mild suppressed TSH 08/2009 and 11/2009 S/p endo eval and [place] 11/2009 - unremarkable Recheck TSH and FT4 now - no clinical sx tachy or weight loss or diarrhea

## 2010-07-22 NOTE — Patient Instructions (Addendum)
It was good to see you today. Medications reviewed, no changes at this time. tdap (tetanus) and pneumonia shots done today Test(s) ordered today. Your results will be called to you after review (48-72hours after test completion). If any changes need to be made, you will be notified at that time. Good luck with your MRI Please schedule followup in 3-4 months to monitor thyroid and blood pressure, call sooner if problems.

## 2010-07-22 NOTE — Telephone Encounter (Signed)
No explanation on labs for fatigue - i expect the problem is related to her arthritis pain causing her to feel fatigued. We discussed her pain/arthritis at OV - no other recommendations at this time for med change. Call if unimproved of worse - thanks

## 2010-07-22 NOTE — Assessment & Plan Note (Signed)
Advanced pain in R shoulder - [facility] for same - planning MRI this week Cont topical NSAIDs and tylenol as ongoing - reassurance provided

## 2010-07-22 NOTE — Progress Notes (Signed)
Subjective:    Patient ID: Sandy Barrett, female    DOB: 1932-07-14, 77 y.o.   MRN: [id number]  HPI Here for follow up - reviewed chronic medical issues:  Osteoarthritis - DJD R knee pain, R hip and R shoulder Follows with [facility] for same -  prev used topical voltaren gel but ran out - relafen, meloxicam and tramadol not helpful Uses glucosamine and flector patch prn  obesity - still trying to lose weight but say exercise is hard because of knee and hip pain  feels like she is eating less that usual b/c stomach pain but no weight loss   HTN - no problems with her medications - no CP, edema, HA or vision changes - 100% complaint   insomnia - improved with trial amitriptyline and/or trazodone - uses every 2-3 nights as needed - sometimes with "hangover"   Abnormal TSH 08/2009 and 11/2009, mild - dx incidental labs- saw endo for same, US done 11/2009 - monitor labs every 3-4 months   intermittent Abdominal Pain due to IBS, improved - CT AP 11/2009 unremarkable - seen for similar pain symptoms 03/2009, 08/2009, 11/2009 - The patient reports constipation, but denies nausea, vomiting, diarrhea, hematochezia, and hematemesis. The location of the pain is right lower quadrant and left lower quadrant and epigastric region. The pain is described as intermittent, cramping and dull. The patient denies the following symptoms: fever, weight loss, dysuria, chest pain, and jaundice. The pain is worse with food. The pain is better with defecation and Align   Past Medical History  Diagnosis Date  . HYPERTENSION   . HYPERTHYROIDISM   . OBESITY   . ARTHRITIS   . Posttraumatic stress disorder   . INSOMNIA   . MIGRAINE HEADACHE     Review of Systems  Respiratory: Negative for shortness of breath.   Cardiovascular: Negative for chest pain.  Neurological: Negative for headaches.       Objective:   Physical Exam BP 128/82  Pulse 79  Temp(Src) 97.2 F (36.2 C) (Oral)  Resp 14  Wt 178  lb 12 oz (81.08 kg)  SpO2 98% Wt Readings from Last 3 Encounters:  07/22/10 178 lb 12 oz (81.08 kg)  01/13/10 184 lb 12.8 oz (83.825 kg)  11/11/09 187 lb (84.823 kg)   Physical Exam  Constitutional: She is overweight, oriented to person, place, and time. She appears well-developed and well-nourished. No distress. Neck: Normal range of motion. Neck supple. No JVD present. No thyromegaly present.  Cardiovascular: Normal rate, regular rhythm and normal heart sounds.  No murmur heard. No BLE edema. Pulmonary/Chest: Effort normal and breath sounds normal. No respiratory distress. She has no wheezes.  Abdominal: Soft. Bowel sounds are normal. She exhibits no distension. There is no tenderness.  Musculoskeletal: decreased range of motion R shoulder with +impingement, no knee effusions. No gross deformities Psychiatric: She has a normal mood and affect. Her behavior is normal. Judgment and thought content normal.   Lab Results  Component Value Date   WBC 4.7 11/06/2009   HGB 13.2 11/06/2009   HCT 39.1 11/06/2009   PLT 375.0 11/06/2009   CHOL 132 08/16/2008   TRIG 34.0 08/16/2008   HDL 39.20 08/16/2008   ALT 11 09/02/2009   AST 13 09/02/2009   NA 141 11/06/2009   K 4.3 11/06/2009   CL 101 11/06/2009   CREATININE 0.6 11/06/2009   BUN 17 11/06/2009   CO2 34* 11/06/2009   TSH 0.25* 11/06/2009  Assessment & Plan:  See problem list. Medications and labs reviewed today.

## 2010-07-23 NOTE — Telephone Encounter (Signed)
Notified pt with md response.Marland KitchenMarland Kitchen7/18/12@10 :02am/LMB

## 2010-07-30 ENCOUNTER — Encounter: Payer: Self-pay | Admitting: Internal Medicine

## 2010-07-30 ENCOUNTER — Other Ambulatory Visit (INDEPENDENT_AMBULATORY_CARE_PROVIDER_SITE_OTHER): Payer: Medicare Other

## 2010-07-30 ENCOUNTER — Ambulatory Visit (INDEPENDENT_AMBULATORY_CARE_PROVIDER_SITE_OTHER): Payer: Medicare Other | Admitting: Internal Medicine

## 2010-07-30 VITALS — BP 112/72 | HR 75 | Temp 98.6°F | Ht 65.0 in

## 2010-07-30 DIAGNOSIS — K055 Other periodontal diseases: Secondary | ICD-10-CM

## 2010-07-30 DIAGNOSIS — K068 Other specified disorders of gingiva and edentulous alveolar ridge: Secondary | ICD-10-CM

## 2010-07-30 DIAGNOSIS — B379 Candidiasis, unspecified: Secondary | ICD-10-CM

## 2010-07-30 DIAGNOSIS — K120 Recurrent oral aphthae: Secondary | ICD-10-CM

## 2010-07-30 LAB — CBC WITH DIFFERENTIAL/PLATELET
Basophils Absolute: 0 10*3/uL (ref 0.0–0.1)
Basophils Relative: 0.6 % (ref 0.0–3.0)
Eosinophils Absolute: 0.1 10*3/uL (ref 0.0–0.7)
Eosinophils Relative: 1.1 % (ref 0.0–5.0)
HCT: 38.3 % (ref 36.0–46.0)
Hemoglobin: 12.7 g/dL (ref 12.0–15.0)
Lymphocytes Relative: 17.1 % (ref 12.0–46.0)
Lymphs Abs: 1.3 10*3/uL (ref 0.7–4.0)
MCHC: 33.2 g/dL (ref 30.0–36.0)
MCV: 90.6 fl (ref 78.0–100.0)
Monocytes Absolute: 0.6 10*3/uL (ref 0.1–1.0)
Monocytes Relative: 8.5 % (ref 3.0–12.0)
Neutro Abs: 5.5 10*3/uL (ref 1.4–7.7)
Neutrophils Relative %: 72.7 % (ref 43.0–77.0)
Platelets: 441 10*3/uL — ABNORMAL HIGH (ref 150.0–400.0)
RBC: 4.23 Mil/uL (ref 3.87–5.11)
RDW: 15 % — ABNORMAL HIGH (ref 11.5–14.6)
WBC: 7.5 10*3/uL (ref 4.5–10.5)

## 2010-07-30 LAB — BASIC METABOLIC PANEL
BUN: 10 mg/dL (ref 6–23)
CO2: 31 mEq/L (ref 19–32)
Calcium: 9.4 mg/dL (ref 8.4–10.5)
Chloride: 103 mEq/L (ref 96–112)
Creatinine, Ser: 0.5 mg/dL (ref 0.4–1.2)
GFR: 141.18 mL/min (ref >60.00)
Glucose, Bld: 97 mg/dL (ref 70–99)
Potassium: 3.7 mEq/L (ref 3.5–5.1)
Sodium: 141 mEq/L (ref 135–145)

## 2010-07-30 MED ORDER — MAGIC MOUTHWASH W/LIDOCAINE: 5.0000 mL | Freq: Four times a day (QID) | ORAL | Status: AC

## 2010-07-30 MED ORDER — CLOTRIMAZOLE-BETAMETHASONE 1-0.05 % EX CREA: TOPICAL_CREAM | Freq: Two times a day (BID) | CUTANEOUS | Status: DC | PRN

## 2010-07-30 NOTE — Patient Instructions (Addendum)
It was good to see you today. Test(s) ordered today. Your results will be called to you after review (48-72hours after test completion). If any changes need to be made, you will be notified at that time. Use magic mouth wash for your gums/mouth sores and lotrisone cream for your fingers - Your prescription(s) have been submitted to your pharmacy. Please take as directed and contact our office if you believe you are having problem(s) with the medication(s). Stop the Flector patch and other NSAIDs like Lodine and Mobic at this time - ok to use Norco if needed for pain we'll make referral to dentist and to podiatrist as requested. Our office will contact you regarding appointment(s) once made.

## 2010-07-30 NOTE — Progress Notes (Signed)
  Subjective:    Patient ID: Sandy Barrett, female    DOB: 01/16/1932, 77 y.o.   MRN: [id number]  HPI As noted in CC - MRI 5 days ago - 3-4 days ago felt "sore" in upper gums - mild bleeding and oozing last 48h Denies fever or rash but "splitting skin" between fingers R hand No trouble swallowing - no fatigue or myalgia No hx same -  On flextor patch but no new meds (?IV contrast for MRI - pt unsure and report unavailable)  Past Medical History  Diagnosis Date  . HYPERTENSION   . HYPERTHYROIDISM   . OBESITY   . ARTHRITIS   . Posttraumatic stress disorder   . INSOMNIA   . MIGRAINE HEADACHE     Review of Systems  Respiratory: Negative for shortness of breath.   Cardiovascular: Negative for chest pain.  Skin: Negative for color change, pallor and rash.       Objective:   Physical Exam BP 112/72  Pulse 75  Temp(Src) 98.6 F (37 C) (Oral)  Ht 5\' 5"  (1.651 m)  SpO2 97% Constitutional: She is oriented to person, place, and time. She appears well-developed and well-nourished. No distress. Dtr at side HENT: Head: Normocephalic and atraumatic. Ears; B TMs ok, no erythema or effusion; Nose: Nose normal.  Mouth/Throat: Oropharynx is clear and moist. No oropharyngeal exudate.  +thrush and apthous ulcer on upper gums over front incisors - very poor dentition throughout - edema of upper lip Eyes: Conjunctivae and EOM are normal. Pupils are equal, round, and reactive to light. No scleral icterus.  Neck: Normal range of motion. Neck supple. No JVD present. No thyromegaly present.  Cardiovascular: Normal rate, regular rhythm and normal heart sounds.  No murmur heard. No BLE edema. Pulmonary/Chest: Effort normal and breath sounds normal. No respiratory distress. She has no wheezes.  Neurological: She is alert and oriented to person, place, and time. No cranial nerve deficit. Coordination normal.  Skin: Skin is warm and dry. No rash noted but candida changes with skin splint interdig of R  hand, esp 2/3 and 3/4. No erythema or drainage.  Psychiatric: She has a normal mood and affect. Her behavior is normal. Judgment and thought content normal.   Lab Results  Component Value Date   WBC 4.7 11/06/2009   HGB 13.2 11/06/2009   HCT 39.1 11/06/2009   PLT 375.0 11/06/2009   CHOL 127 07/22/2010   TRIG 48.0 07/22/2010   HDL 53.30 07/22/2010   ALT 11 09/02/2009   AST 13 09/02/2009   NA 140 07/22/2010   K 3.9 07/22/2010   CL 103 07/22/2010   CREATININE 0.5 07/22/2010   BUN 22 07/22/2010   CO2 32 07/22/2010   TSH 0.39 07/22/2010         Assessment & Plan:  Apthous ulcer Candida - oral and skin Bleeding gums - likely d/t #1 but r/o heme issue or med reaction  Check labs, refer to dentist - use magic mouthwash and lotrisone Stop NSAIDs due to possible drug reaction and rec pt to contact MRI or ortho re: possible reaction to MRI agents

## 2010-08-05 ENCOUNTER — Telehealth: Payer: Self-pay

## 2010-08-05 NOTE — Telephone Encounter (Signed)
No medical need for any "pre treatment" - thanks

## 2010-08-05 NOTE — Telephone Encounter (Signed)
Pt advised.

## 2010-08-05 NOTE — Telephone Encounter (Signed)
Pt called stating she has a dentist appt Friday and Dentist would like to know if pt needs to be pre-medicated prior to dental procedures, please advise.

## 2010-09-24 ENCOUNTER — Other Ambulatory Visit: Payer: Self-pay | Admitting: Internal Medicine

## 2010-10-28 ENCOUNTER — Encounter: Payer: Self-pay | Admitting: Internal Medicine

## 2010-10-28 ENCOUNTER — Other Ambulatory Visit (INDEPENDENT_AMBULATORY_CARE_PROVIDER_SITE_OTHER): Payer: Medicare Other

## 2010-10-28 ENCOUNTER — Ambulatory Visit (INDEPENDENT_AMBULATORY_CARE_PROVIDER_SITE_OTHER): Payer: Medicare Other | Admitting: Internal Medicine

## 2010-10-28 VITALS — BP 112/72 | HR 75 | Temp 98.4°F | Ht 62.0 in | Wt 173.4 lb

## 2010-10-28 DIAGNOSIS — Z23 Encounter for immunization: Secondary | ICD-10-CM

## 2010-10-28 DIAGNOSIS — E0590 Thyrotoxicosis, unspecified without thyrotoxic crisis or storm: Secondary | ICD-10-CM

## 2010-10-28 DIAGNOSIS — I10 Essential (primary) hypertension: Secondary | ICD-10-CM

## 2010-10-28 DIAGNOSIS — M1990 Unspecified osteoarthritis, unspecified site: Secondary | ICD-10-CM

## 2010-10-28 LAB — TSH: TSH: 0.35 u[IU]/mL (ref 0.35–5.50)

## 2010-10-28 LAB — T4, FREE: Free T4: 1.24 ng/dL (ref 0.60–1.60)

## 2010-10-28 NOTE — Patient Instructions (Signed)
It was good to see you today. Medications reviewed, no changes at this time. Test(s) ordered today. Your results will be called to you after review (48-72hours after test completion). If any changes need to be made, you will be notified at that time. Flu shot done today Please schedule followup in 4 months for thyroid and blood pressure check, call sooner if problems.

## 2010-10-28 NOTE — Assessment & Plan Note (Signed)
BP Readings from Last 3 Encounters:  10/28/10 112/72  07/30/10 112/72  07/22/10 128/82   The current medical regimen is effective;  continue present plan and medications.

## 2010-10-28 NOTE — Progress Notes (Signed)
Subjective:    Patient ID: Sandy Barrett, female    DOB: 30-Nov-1932, 77 y.o.   MRN: [id number]  HPI  Here for follow up - reviewed chronic medical issues:  Osteoarthritis - DJD R knee pain, R hip and R shoulder Follows with [facility] for same -  prev used topical voltaren gel but ran out - relafen, meloxicam and tramadol not helpful Uses glucosamine, edolac and percocet prn  obesity - still trying to lose weight but say exercise is hard because of knee and hip pain  feels like she is eating less that usual b/c stomach pain but no weight loss   HTN - no problems with her medications - no CP, edema, HA or vision changes - 100% complaint   insomnia - improved with trial amitriptyline and/or trazodone - uses every 2-3 nights as needed - sometimes with "hangover"   Abnormal TSH 08/2009 and 11/2009, mild - dx incidental labs- saw endo for same, US done 11/2009 - monitor labs every 3-4 months   intermittent Abdominal Pain due to IBS, improved - CT AP 11/2009 unremarkable - seen for similar pain symptoms 03/2009, 08/2009, 11/2009 - The patient reports constipation, but denies nausea, vomiting, diarrhea, hematochezia, and hematemesis. The location of the pain is right lower quadrant and left lower quadrant and epigastric region. The pain is described as intermittent, cramping and dull. The patient denies the following symptoms: fever, weight loss, dysuria, chest pain, and jaundice. The pain is worse with food. The pain is better with defecation and Align   Past Medical History  Diagnosis Date  . HYPERTENSION   . HYPERTHYROIDISM   . OBESITY   . ARTHRITIS   . Posttraumatic stress disorder   . INSOMNIA   . MIGRAINE HEADACHE     Review of Systems  Respiratory: Negative for shortness of breath.   Cardiovascular: Negative for chest pain.  Neurological: Negative for headaches.       Objective:   Physical Exam  BP 112/72  Pulse 75  Temp(Src) 98.4 F (36.9 C) (Oral)  Ht 5\' 2"   (1.575 m)  Wt 173 lb 6.4 oz (78.654 kg)  BMI 31.72 kg/m2  SpO2 95% Wt Readings from Last 3 Encounters:  10/28/10 173 lb 6.4 oz (78.654 kg)  07/22/10 178 lb 12 oz (81.08 kg)  01/13/10 184 lb 12.8 oz (83.825 kg)   Constitutional: She is overweight, but appears well-developed and well-nourished. No distress. Neck: Normal range of motion. Neck supple. No JVD present. No thyromegaly present.  Cardiovascular: Normal rate, regular rhythm and normal heart sounds.  No murmur heard. No BLE edema. Pulmonary/Chest: Effort normal and breath sounds normal. No respiratory distress. She has no wheezes.  Abdominal: Soft. Bowel sounds are normal. She exhibits no distension. There is no tenderness.  Musculoskeletal: decreased range of motion R shoulder with +impingement, no knee effusions. No gross deformities Psychiatric: She has a normal mood and affect. Her behavior is normal. Judgment and thought content normal.   Lab Results  Component Value Date   WBC 7.5 07/30/2010   HGB 12.7 07/30/2010   HCT 38.3 07/30/2010   PLT 441.0* 07/30/2010   CHOL 127 07/22/2010   TRIG 48.0 07/22/2010   HDL 53.30 07/22/2010   ALT 11 09/02/2009   AST 13 09/02/2009   NA 141 07/30/2010   K 3.7 07/30/2010   CL 103 07/30/2010   CREATININE 0.5 07/30/2010   BUN 10 07/30/2010   CO2 31 07/30/2010   TSH 0.39 07/22/2010  Assessment & Plan:  See problem list. Medications and labs reviewed today.

## 2010-10-28 NOTE — Assessment & Plan Note (Signed)
Abnormal mild suppressed TSH 08/2009 and 11/2009 S/p endo eval and [place] 11/2009 - unremarkable Recheck TSH and FT4 now - no clinical sx tachy or diarrhea, slow weight loss  Lab Results  Component Value Date   TSH 0.39 07/22/2010

## 2010-10-28 NOTE — Assessment & Plan Note (Signed)
Advanced pain in R shoulder - degenerative tear RTC on MRI -  follows with [facility] and [place] [facility] for same - wishes to avoid surgery Continue NSAIDs, narc and tylenol as ongoing - reassurance provided

## 2010-12-04 ENCOUNTER — Other Ambulatory Visit: Payer: Self-pay | Admitting: Internal Medicine

## 2010-12-12 ENCOUNTER — Ambulatory Visit (INDEPENDENT_AMBULATORY_CARE_PROVIDER_SITE_OTHER): Payer: Medicare Other | Admitting: Internal Medicine

## 2010-12-12 ENCOUNTER — Encounter: Payer: Self-pay | Admitting: Internal Medicine

## 2010-12-12 ENCOUNTER — Other Ambulatory Visit: Payer: Self-pay | Admitting: Internal Medicine

## 2010-12-12 ENCOUNTER — Ambulatory Visit (INDEPENDENT_AMBULATORY_CARE_PROVIDER_SITE_OTHER)
Admission: RE | Admit: 2010-12-12 | Discharge: 2010-12-12 | Disposition: A | Discharge status: Home / Self Care | Payer: Medicare Other | Source: Ambulatory Visit | Attending: Internal Medicine | Admitting: Internal Medicine

## 2010-12-12 VITALS — BP 120/72 | HR 81 | Temp 99.5°F

## 2010-12-12 DIAGNOSIS — J209 Acute bronchitis, unspecified: Secondary | ICD-10-CM

## 2010-12-12 DIAGNOSIS — R9389 Abnormal findings on diagnostic imaging of other specified body structures: Secondary | ICD-10-CM

## 2010-12-12 DIAGNOSIS — I10 Essential (primary) hypertension: Secondary | ICD-10-CM

## 2010-12-12 MED ORDER — CHLORPHENIRAMINE-HYDROCODONE 8-10 MG/5ML PO LQCR: 5.0000 mL | Freq: Two times a day (BID) | ORAL | Status: DC | PRN

## 2010-12-12 MED ORDER — LEVOFLOXACIN 500 MG PO TABS: 500.0000 mg | ORAL_TABLET | Freq: Every day | ORAL | Status: AC

## 2010-12-12 MED ORDER — PROMETHAZINE-CODEINE 6.25-10 MG/5ML PO SYRP: 5.0000 mL | ORAL_SOLUTION | ORAL | Status: AC | PRN

## 2010-12-12 NOTE — Patient Instructions (Addendum)
It was good to see you today. Chest x-ray ordered today. Your results will be called to you after review Begin Levaquin antibiotics: one daily for the next 7 days. Also codeine prescription cough syrup 2 or 3 times daily, especially at bedtime for cough symptoms Your prescription(s) have been submitted to your pharmacy. Please take as directed and contact our office if you believe you are having problem(s) with the medication(s).

## 2010-12-12 NOTE — Progress Notes (Signed)
  Subjective:    Patient ID: Sandy Barrett, female    DOB: 12-May-1932, 77 y.o.   MRN: [id number]  HPI  complains of cough Onset 72 h ago Productive of yellow sputum Symptoms worse at night but continuous throughout the day symptoms not improved with use over-the-counter medications Associated with mild low-grade fever, mild frontal headache and sinus pressure. Denies sore throat, shortness of breath, change in edema or travel  Past Medical History  Diagnosis Date  . HYPERTENSION   . HYPERTHYROIDISM   . OBESITY   . ARTHRITIS   . Posttraumatic stress disorder   . INSOMNIA   . MIGRAINE HEADACHE     Review of Systems  Constitutional: Positive for chills and fatigue.  HENT: Positive for sneezing. Negative for ear pain and postnasal drip.   Respiratory: Negative for wheezing and stridor.        Objective:   Physical Exam BP 120/72  Pulse 81  Temp(Src) 99.5 F (37.5 C) (Oral)  SpO2 97% General: No acute distress but ongoing chest congestion with cough HEENT: Mild maxillary sinus tenderness to palpation, oropharynx erythematous with clear postnasal drip no exudate Lungs: Bilateral rhonchi, no expiratory wheeze Cardiovascular: Regular rate and rhythm, chronic edema without change       Assessment & Plan:  Acute bronchitis  Check chest x-ray rule out pneumonia Empiric antibiotics given age and comorbid disease Symptomatic cough suppression prescribed Continue Tylenol or Advil with hydration as discussed and rest

## 2010-12-16 ENCOUNTER — Other Ambulatory Visit (INDEPENDENT_AMBULATORY_CARE_PROVIDER_SITE_OTHER): Payer: Medicare Other

## 2010-12-16 DIAGNOSIS — R9389 Abnormal findings on diagnostic imaging of other specified body structures: Secondary | ICD-10-CM

## 2010-12-16 DIAGNOSIS — R918 Other nonspecific abnormal finding of lung field: Secondary | ICD-10-CM

## 2010-12-16 DIAGNOSIS — I10 Essential (primary) hypertension: Secondary | ICD-10-CM

## 2010-12-17 LAB — BASIC METABOLIC PANEL
BUN: 18 mg/dL (ref 6–23)
CO2: 30 mEq/L (ref 19–32)
Calcium: 9.5 mg/dL (ref 8.4–10.5)
Chloride: 102 mEq/L (ref 96–112)
Creatinine, Ser: 0.7 mg/dL (ref 0.4–1.2)
GFR: 111.88 mL/min (ref >60.00)
Glucose, Bld: 90 mg/dL (ref 70–99)
Potassium: 3.6 mEq/L (ref 3.5–5.1)
Sodium: 140 mEq/L (ref 135–145)

## 2010-12-18 ENCOUNTER — Ambulatory Visit (INDEPENDENT_AMBULATORY_CARE_PROVIDER_SITE_OTHER)
Admission: RE | Admit: 2010-12-18 | Discharge: 2010-12-18 | Disposition: A | Discharge status: Home / Self Care | Payer: Medicare Other | Source: Ambulatory Visit | Attending: Internal Medicine | Admitting: Internal Medicine

## 2010-12-18 DIAGNOSIS — R9389 Abnormal findings on diagnostic imaging of other specified body structures: Secondary | ICD-10-CM

## 2010-12-18 DIAGNOSIS — R918 Other nonspecific abnormal finding of lung field: Secondary | ICD-10-CM

## 2010-12-18 DIAGNOSIS — I10 Essential (primary) hypertension: Secondary | ICD-10-CM

## 2010-12-18 MED ORDER — IOHEXOL 300 MG/ML  SOLN
80.0000 mL | Freq: Once | INTRAMUSCULAR | Status: AC | PRN
  Administered 2010-12-18: 80 mL via INTRAVENOUS

## 2010-12-22 ENCOUNTER — Other Ambulatory Visit: Payer: Self-pay | Admitting: Internal Medicine

## 2010-12-22 DIAGNOSIS — E079 Disorder of thyroid, unspecified: Secondary | ICD-10-CM

## 2011-01-13 ENCOUNTER — Encounter: Payer: Self-pay | Admitting: Endocrinology

## 2011-01-13 ENCOUNTER — Ambulatory Visit (INDEPENDENT_AMBULATORY_CARE_PROVIDER_SITE_OTHER): Payer: Medicare Other | Admitting: Endocrinology

## 2011-01-13 DIAGNOSIS — E042 Nontoxic multinodular goiter: Secondary | ICD-10-CM

## 2011-01-13 DIAGNOSIS — E0590 Thyrotoxicosis, unspecified without thyrotoxic crisis or storm: Secondary | ICD-10-CM

## 2011-01-13 NOTE — Progress Notes (Signed)
Subjective:    Patient ID: Sandy Barrett, female    DOB: [birth year], 78 y.o.   MRN: [id number]  HPI Pt was seen here 1 year ago for mild hyperthyroidism,  i-131 was considered, but she could not have it then, due to recent iodine (contrast dye) load She was noted to have a large multinodular goiter. She denies sob.   Cough is improved, but the last dye load was 1 month ago.   Past Medical History  Diagnosis Date  . HYPERTENSION   . HYPERTHYROIDISM   . OBESITY   . ARTHRITIS   . Posttraumatic stress disorder   . INSOMNIA   . MIGRAINE HEADACHE     Past Surgical History  Procedure Date  . Abdominal hysterectomy 1976  . Total hip arthroplasty 1998     right  . Tonsillectomy   . Breast surgery     biopsy    History   Social History  . Marital Status: Widowed    Spouse Name: N/A    Number of Children: N/A  . Years of Education: N/A   Occupational History  . Not on file.   Social History Main Topics  . Smoking status: Never Smoker   . Smokeless tobacco: Not on file  . Alcohol Use: No  . Drug Use: No  . Sexually Active:    Other Topics Concern  . Not on file   Social History Narrative  . No narrative on file    Current Outpatient Prescriptions on File Prior to Visit  Medication Sig Dispense Refill  . hydrochlorothiazide (HYDRODIURIL) 25 MG tablet TAKE 1 TABLET BY MOUTH EVERY DAY  30 tablet  5  . oxyCODONE-acetaminophen (PERCOCET) 7.5-325 MG per tablet Take 1 by mouth three times a day as needed      . Probiotic Product (ALIGN) 4 MG CAPS Take by mouth daily.        . propranolol (INDERAL) 20 MG tablet TAKE 1 TABLET BY MOUTH TWICE A DAY  60 tablet  5  . acetaminophen (TYLENOL ARTHRITIS PAIN) 650 MG CR tablet Take 650 mg by mouth 2 (two) times daily.        [name] etodolac (LODINE) 500 MG tablet Take 1 by mouth twice a day as needed      . Glucosamine-Chondroit-Vit C-Mn (GLUCOSAMINE CHONDR 500 COMPLEX) CAPS Take by mouth 2 (two) times daily.        . traMADol (ULTRAM)  50 MG tablet       . traZODone (DESYREL) 50 MG tablet Take 50 mg by mouth at bedtime.          No Known Allergies  Family History  Problem Relation Age of Onset  . Asthma Mother   . Asthma Father   . Prostate cancer Father     BP 108/72  Pulse 75  Temp(Src) 98 F (36.7 C) (Oral)  Ht 5\' 4"  (1.626 m)  Wt 175 lb 6.4 oz (79.561 kg)  BMI 30.11 kg/m2  SpO2 98%  Review of Systems Denies weight change    Objective:   Physical Exam VITAL SIGNS:  See vs page GENERAL: no distress Neck:  the thyroid is 10 normal size, with a multinodular surface.     Lab Results  Component Value Date   TSH 0.35 10/28/2010       Assessment & Plan:  Hyperthyroidism, borderline, with intermittently suppressed tsh Abnormal cxr.  Because she has not recently had ct, she is ready for scanning (and possible i-131 rx).  large multinodular goiter, unchanged.

## 2011-01-13 NOTE — Patient Instructions (Signed)
let's check a thyroid "scan" (a special, but easy and painless type of thyroid x ray).  Please do this after feb 10, as we need to give the iodine time to leave your body.  It works like this: you go to the x-ray department of the hospital to swallow a pill, which contains a miniscule amount of radiation.  You will not notice any symptoms from this.  You will go back to the x-ray department the next day, to lie down in front of a camera.  The results of this will be sent to me.  Please return here 1-2 days after the x-ray.

## 2011-02-16 ENCOUNTER — Encounter (HOSPITAL_COMMUNITY)
Admission: RE | Admit: 2011-02-16 | Discharge: 2011-02-16 | Disposition: A | Discharge status: Home / Self Care | Payer: Medicare Other | Source: Ambulatory Visit | Attending: Endocrinology | Admitting: Endocrinology

## 2011-02-16 DIAGNOSIS — E0590 Thyrotoxicosis, unspecified without thyrotoxic crisis or storm: Secondary | ICD-10-CM

## 2011-02-16 DIAGNOSIS — E0520 Thyrotoxicosis with toxic multinodular goiter without thyrotoxic crisis or storm: Secondary | ICD-10-CM | POA: Insufficient documentation

## 2011-02-17 ENCOUNTER — Encounter (HOSPITAL_COMMUNITY)
Admission: RE | Admit: 2011-02-17 | Discharge: 2011-02-17 | Disposition: A | Discharge status: Home / Self Care | Payer: Medicare Other | Source: Ambulatory Visit | Attending: Endocrinology | Admitting: Endocrinology

## 2011-02-17 ENCOUNTER — Other Ambulatory Visit: Payer: Self-pay | Admitting: Endocrinology

## 2011-02-17 DIAGNOSIS — E0520 Thyrotoxicosis with toxic multinodular goiter without thyrotoxic crisis or storm: Secondary | ICD-10-CM | POA: Diagnosis not present

## 2011-02-17 DIAGNOSIS — E042 Nontoxic multinodular goiter: Secondary | ICD-10-CM

## 2011-02-17 MED ORDER — SODIUM IODIDE I 131 CAPSULE
5.8000 | Freq: Once | INTRAVENOUS | Status: AC | PRN
  Administered 2011-02-17: 5.8 via ORAL

## 2011-02-17 MED ORDER — SODIUM PERTECHNETATE TC 99M INJECTION
10.2000 | Freq: Once | INTRAVENOUS | Status: AC | PRN
  Administered 2011-02-17: 10 via INTRAVENOUS

## 2011-02-19 ENCOUNTER — Telehealth: Payer: Self-pay

## 2011-02-19 NOTE — Telephone Encounter (Signed)
Pt was requesting results of thyroid scan and wondered about appointment for [place]. Gave her information to check phone tree message regarding scan.

## 2011-02-19 NOTE — Telephone Encounter (Signed)
Pt called requesting a call back specifically from Dr George Hugh CMA. Pt declined to discuss with me further.

## 2011-02-24 ENCOUNTER — Ambulatory Visit (INDEPENDENT_AMBULATORY_CARE_PROVIDER_SITE_OTHER): Payer: Medicare Other | Admitting: Endocrinology

## 2011-02-24 ENCOUNTER — Encounter: Payer: Self-pay | Admitting: Endocrinology

## 2011-02-24 ENCOUNTER — Ambulatory Visit
Admission: RE | Admit: 2011-02-24 | Discharge: 2011-02-24 | Disposition: A | Discharge status: Home / Self Care | Payer: Medicare Other | Source: Ambulatory Visit | Attending: Endocrinology | Admitting: Endocrinology

## 2011-02-24 VITALS — BP 124/78 | HR 73 | Temp 98.8°F | Wt 167.0 lb

## 2011-02-24 DIAGNOSIS — E042 Nontoxic multinodular goiter: Secondary | ICD-10-CM | POA: Diagnosis not present

## 2011-02-24 DIAGNOSIS — I82890 Acute embolism and thrombosis of other specified veins: Secondary | ICD-10-CM | POA: Diagnosis not present

## 2011-02-24 DIAGNOSIS — E0590 Thyrotoxicosis, unspecified without thyrotoxic crisis or storm: Secondary | ICD-10-CM

## 2011-02-24 NOTE — Progress Notes (Signed)
  Subjective:    Patient ID: Sandy Barrett, female    DOB: 10/26/1932, 78 y.o.   MRN: [id number]  HPI Pt was noted incidentally on [place] to have thrombosed jugular vein.  says she never had this before. Past Medical History  Diagnosis Date  . HYPERTENSION   . HYPERTHYROIDISM   . OBESITY   . ARTHRITIS   . Posttraumatic stress disorder   . INSOMNIA   . MIGRAINE HEADACHE     Past Surgical History  Procedure Date  . Abdominal hysterectomy 1976  . Total hip arthroplasty 1998     right  . Tonsillectomy   . Breast surgery     biopsy    History   Social History  . Marital Status: Widowed    Spouse Name: N/A    Number of Children: N/A  . Years of Education: N/A   Occupational History  . Not on file.   Social History Main Topics  . Smoking status: Never Smoker   . Smokeless tobacco: Not on file  . Alcohol Use: No  . Drug Use: No  . Sexually Active:    Other Topics Concern  . Not on file   Social History Narrative  . No narrative on file    Current Outpatient Prescriptions on File Prior to Visit  Medication Sig Dispense Refill  . acetaminophen (TYLENOL ARTHRITIS PAIN) 650 MG CR tablet Take 650 mg by mouth 2 (two) times daily.        [name] etodolac (LODINE) 500 MG tablet Take 1 by mouth twice a day as needed      . hydrochlorothiazide (HYDRODIURIL) 25 MG tablet TAKE 1 TABLET BY MOUTH EVERY DAY  30 tablet  5  . oxyCODONE-acetaminophen (PERCOCET) 7.5-325 MG per tablet Take 1 by mouth three times a day as needed      . Probiotic Product (ALIGN) 4 MG CAPS Take by mouth daily.        . propranolol (INDERAL) 20 MG tablet TAKE 1 TABLET BY MOUTH TWICE A DAY  60 tablet  5  . traMADol (ULTRAM) 50 MG tablet       . traZODone (DESYREL) 50 MG tablet Take 50 mg by mouth at bedtime.          No Known Allergies  Family History  Problem Relation Age of Onset  . Asthma Mother   . Asthma Father   . Prostate cancer Father     BP 124/78  Pulse 73  Temp(Src) 98.8 F (37.1 C)  (Oral)  Wt 167 lb (75.751 kg)  SpO2 96%   Review of Systems Denies neck pain, facial swelling, and visual loss    Objective:   Physical Exam VITAL SIGNS:  See vs page GENERAL: no distress Neck: the thyroid is 10 normal size, with a multinodular surface.         Assessment & Plan:  Hyperthyroidism due to large multinodular goiter.  Ready for i-131 rx Incidentally noted thrombosed jugular vein.

## 2011-02-24 NOTE — Patient Instructions (Addendum)
i have ordered for you a treatment pill of radioactive iodine.  Although it is a larger amount of radiation, you will again notice no symptoms from this.  The pill is gone from your body in a few days (during which you should stay away from other people), but takes several months to work.  Therefore, please return here approximately 6-8 weeks after the treatment.  This treatment has been available for many years, and the only known side-effect is an underactive thyroid.  It is possible that i would eventually prescribe for you a thyroid hormone pill, which is very inexpensive.  You don't have to worry about side-effects of this thyroid hormone pill, because it is the same molecule your thyroid makes.   We'll have to call you back about the blockage in the neck vein.

## 2011-03-01 DIAGNOSIS — I82890 Acute embolism and thrombosis of other specified veins: Secondary | ICD-10-CM | POA: Insufficient documentation

## 2011-03-02 ENCOUNTER — Ambulatory Visit (INDEPENDENT_AMBULATORY_CARE_PROVIDER_SITE_OTHER): Payer: Medicare Other | Admitting: Internal Medicine

## 2011-03-02 ENCOUNTER — Telehealth: Payer: Self-pay | Admitting: *Deleted

## 2011-03-02 ENCOUNTER — Encounter: Payer: Self-pay | Admitting: Internal Medicine

## 2011-03-02 DIAGNOSIS — I82890 Acute embolism and thrombosis of other specified veins: Secondary | ICD-10-CM | POA: Diagnosis not present

## 2011-03-02 DIAGNOSIS — E0590 Thyrotoxicosis, unspecified without thyrotoxic crisis or storm: Secondary | ICD-10-CM

## 2011-03-02 DIAGNOSIS — E042 Nontoxic multinodular goiter: Secondary | ICD-10-CM | POA: Diagnosis not present

## 2011-03-02 DIAGNOSIS — I10 Essential (primary) hypertension: Secondary | ICD-10-CM

## 2011-03-02 MED ORDER — TRAZODONE HCL 50 MG PO TABS: 50.0000 mg | ORAL_TABLET | Freq: Every day | ORAL | Status: DC

## 2011-03-02 MED ORDER — BUTALBITAL-APAP-CAFFEINE 50-325-40 MG PO TABS: 1.0000 | ORAL_TABLET | Freq: Two times a day (BID) | ORAL | Status: DC | PRN

## 2011-03-02 NOTE — Telephone Encounter (Signed)
Called pt no answer LMOM md sent [facility]... 03/02/11@1 :50pm/LMB

## 2011-03-02 NOTE — Assessment & Plan Note (Signed)
BP Readings from Last 3 Encounters:  03/02/11 120/72  02/24/11 124/78  01/13/11 108/72   The current medical regimen is effective;  continue present plan and medications.

## 2011-03-02 NOTE — Assessment & Plan Note (Signed)
[place] 02/2011 for goiter showed  incidental L IJ thrombosis - see below I d/w Everardo All today: he has reviewed with IR Sarita Haver) this is felt to be chronic and no tx needed unless symptomatic -

## 2011-03-02 NOTE — Assessment & Plan Note (Signed)
Abnormal mild suppressed TSH 08/2009 and 11/2009, 10/2010 S/p endo eval and [place] 11/2009 - unremarkable; then progression goiter noted on 02/2011 follow up - planning I-131 ablation Reviewed same with pt in depth today including complications of untreated hyperthyroid dz  Lab Results  Component Value Date   TSH 0.35 10/28/2010

## 2011-03-02 NOTE — Telephone Encounter (Signed)
Pt states she mention to md about the headaches that she been having. MD didn't rx anything. Wanting something to be call into pharmacy... 03/02/11@11 :45am/LMB

## 2011-03-02 NOTE — Assessment & Plan Note (Signed)
associated with hyperthyroidism - imaging 02/2011 reviewed - incidental L IJ thrombosis - see below Endo has reviewed with IR Sarita Haver - felt to be chronic and no tx needed unless symptomatic -  Encouraged pt to proceed with ablation as recommended to avoid complications of AF and osteopenia acceleration

## 2011-03-02 NOTE — Telephone Encounter (Signed)
Pt with hx nigraines - reports increase headches with "stress" about thyroid - rx esgic prn - new erx done

## 2011-03-02 NOTE — Progress Notes (Signed)
Subjective:    Patient ID: Sandy Barrett, female    DOB: [birth year], 78 y.o.   MRN: [id number]  HPI  Here for follow up - reviewed chronic medical issues:  Osteoarthritis - DJD R knee pain, R hip and R shoulder Follows with [facility] for same -  prev used topical voltaren gel but ran out - relafen, meloxicam and tramadol not helpful Uses glucosamine, edolac and percocet prn  hypertension - no problems with her medications - no chest pain, edema, headache or vision changes - 100% complaint   insomnia - improved with trial amitriptyline and/or trazodone - uses every 2-3 nights as needed - sometimes with "hangover" but this is improved with decrease dose trazodone. Requests refill  Hyperthyroidism, associated with multinodular goiter - eval and tx plans by endo reviewed 02/2011 - Abnormal TSH 08/2009 and 11/2009, mild - dx incidental labs- saw endo for same, US done 11/2009 and 02/2011 - planning I-131 ablation 03/2011  Chronic IBS - associated with intermittent abdominal pain - CT AP 11/2009 unremarkable - seen for similar pain symptoms 03/2009, 08/2009, 11/2009 - The patient reports chronic constipation, but denies nausea, vomiting, diarrhea, hematochezia, and hematemesis.  pain is worse with food. The pain is better with defecation and Align   Past Medical History  Diagnosis Date  . HYPERTENSION   . HYPERTHYROIDISM   . OBESITY   . ARTHRITIS   . Posttraumatic stress disorder   . INSOMNIA   . MIGRAINE HEADACHE     Review of Systems  Constitutional: Positive for fatigue. Negative for fever and appetite change.  Respiratory: Negative for shortness of breath and wheezing.   Cardiovascular: Negative for chest pain and palpitations.  Neurological: Negative for headaches.       Objective:   Physical Exam  BP 120/72  Pulse 79  Temp(Src) 98.2 F (36.8 C) (Oral)  Ht 5\' 2"  (1.575 m)  Wt 170 lb 3.2 oz (77.202 kg)  BMI 31.13 kg/m2  SpO2 98% Wt Readings from Last 3 Encounters:   03/02/11 170 lb 3.2 oz (77.202 kg)  02/24/11 167 lb (75.751 kg)  01/13/11 175 lb 6.4 oz (79.561 kg)   Constitutional: She is overweight, but appears well-developed and well-nourished. No distress. Neck: goiter nontender - no discrete mass -Normal range of motion. Neck supple. No JVD present. No thyromegaly present.  Cardiovascular: Normal rate, regular rhythm and normal heart sounds.  No murmur heard. No BLE edema. Pulmonary/Chest: Effort normal and breath sounds normal. No respiratory distress. She has no wheezes.  Musculoskeletal: Chronic arthritic changes and right frozen shoulder without change. no effusions. No gross deformities Psychiatric: She has a normal mood and affect. Her behavior is normal. Judgment and thought content normal.   Lab Results  Component Value Date   WBC 7.5 07/30/2010   HGB 12.7 07/30/2010   HCT 38.3 07/30/2010   PLT 441.0* 07/30/2010   CHOL 127 07/22/2010   TRIG 48.0 07/22/2010   HDL 53.30 07/22/2010   ALT 11 09/02/2009   AST 13 09/02/2009   NA 140 12/16/2010   K 3.6 12/16/2010   CL 102 12/16/2010   CREATININE 0.7 12/16/2010   BUN 18 12/16/2010   CO2 30 12/16/2010   TSH 0.35 10/28/2010      US Soft Tissue Head/neck  02/24/2011  *RADIOLOGY REPORT*  Clinical Data: Multinodular goiter.  THYROID ULTRASOUND  Technique: Ultrasound examination of the thyroid gland and adjacent soft tissues was performed.  Comparison:  Ultrasound 11/19/2009, CT chest 12/18/2010  Findings:  Right thyroid lobe:  7.6 x 2.8 x 4.2 cm.  Left thyroid lobe:  10.2 x 5.2 x 6.6 cm.  Left lobe is diffusely replaced by heterogeneous tissue.  It is difficult to identify a discrete nodule on the left. Isthmus:  3 mm  Focal nodules:  Right upper pole nodule is solid and measures 8 x 11 mm.  Right lower pole nodule is mostly solid and measures 28 x 18 x 23 mm.  4 mm calcification right lower pole.  Left lobe is heterogeneous and markedly enlarged and extends substernally.  Large substernal goiter is  noted on the prior chest CT.  It is difficult to measure a discrete nodule on the left.  Lymphadenopathy:  None visualized.  There is a partially occlusive thrombus in the left jugular vein. This is likely due to extrinsic compression from a large substernal goiter.  Review of the chest CT   does not show definite flow in the left jugular vein.   There is also extrinsic compression of the right jugular vein which may show slight flow on the CT.  IMPRESSION: Large heterogeneous goiter on the left extending substernally and not fully evaluated by ultrasound.  Substernal goiter is compressing the left jugular vein with thrombus in the left jugular vein.  Stable right thyroid nodules.  I discussed the findings by telephone with Dr. Everardo All on 02/24/2011 at 1200 hours.  Original Report Authenticated By: Camelia Phenes, M.D.   Assessment & Plan:  See problem list. Medications and labs reviewed today.  Time spent with pt today 25 minutes, greater than 50% time spent counseling patient on hypertension and thyroid condition and medication review. Also review of imaging and plans with Dr Everardo All in office today at time of pt's OV with me

## 2011-03-02 NOTE — Patient Instructions (Signed)
It was good to see you today. We have reviewed your prior records including labs and tests today I encourage you to proceed with the treatment prescribed by Dr. Everardo All for your thyroid as soon as it is scheduled.  Untreated overactive thyroid can lead to complications with your bones (weakening) and extra "stress" on your heart (rapid heart rate called atrial fibrillation) - these risks can be minimized by treatment and improvement of your overactive thyroid condition as proposed by Dr. Everardo All, and I encourage you to do this sooner rather than later Other Medications reviewed, no changes at this time. Refill on medication(s) as discussed today. Please schedule followup in 3-4 months for blood pressure check, call sooner if problems.

## 2011-03-03 ENCOUNTER — Inpatient Hospital Stay (HOSPITAL_COMMUNITY): Admission: RE | Admit: 2011-03-03 | Payer: Medicare Other | Source: Ambulatory Visit

## 2011-03-13 ENCOUNTER — Encounter (HOSPITAL_COMMUNITY): Payer: Self-pay

## 2011-03-13 ENCOUNTER — Encounter (HOSPITAL_COMMUNITY)
Admission: RE | Admit: 2011-03-13 | Discharge: 2011-03-13 | Disposition: A | Discharge status: Home / Self Care | Payer: Medicare Other | Source: Ambulatory Visit | Attending: Endocrinology | Admitting: Endocrinology

## 2011-03-13 DIAGNOSIS — E0590 Thyrotoxicosis, unspecified without thyrotoxic crisis or storm: Secondary | ICD-10-CM | POA: Insufficient documentation

## 2011-03-13 MED ORDER — SODIUM IODIDE I 131 CAPSULE
31.1000 | Freq: Once | INTRAVENOUS | Status: AC | PRN
  Administered 2011-03-13: 31.1 via ORAL

## 2011-03-19 ENCOUNTER — Telehealth: Payer: Self-pay | Admitting: *Deleted

## 2011-03-19 NOTE — Telephone Encounter (Signed)
Pt called to see when she needed to F/U after I-131 treatment last week. Pt informed to schedule F/U OV with MD in 6-8 weeks. Pt states she will callback to make an appointment to F/U.

## 2011-03-31 ENCOUNTER — Other Ambulatory Visit: Payer: Self-pay | Admitting: Internal Medicine

## 2011-04-13 DIAGNOSIS — M1710 Unilateral primary osteoarthritis, unspecified knee: Secondary | ICD-10-CM | POA: Diagnosis not present

## 2011-04-13 DIAGNOSIS — M25569 Pain in unspecified knee: Secondary | ICD-10-CM | POA: Diagnosis not present

## 2011-05-30 ENCOUNTER — Other Ambulatory Visit: Payer: Self-pay | Admitting: Internal Medicine

## 2011-06-02 ENCOUNTER — Encounter: Payer: Self-pay | Admitting: Endocrinology

## 2011-06-02 ENCOUNTER — Other Ambulatory Visit (INDEPENDENT_AMBULATORY_CARE_PROVIDER_SITE_OTHER): Payer: Medicare Other

## 2011-06-02 ENCOUNTER — Ambulatory Visit (INDEPENDENT_AMBULATORY_CARE_PROVIDER_SITE_OTHER): Payer: Medicare Other | Admitting: Endocrinology

## 2011-06-02 VITALS — BP 110/66 | HR 70 | Temp 98.2°F | Ht 64.0 in | Wt 165.0 lb

## 2011-06-02 DIAGNOSIS — E0590 Thyrotoxicosis, unspecified without thyrotoxic crisis or storm: Secondary | ICD-10-CM

## 2011-06-02 LAB — TSH: TSH: 0.75 u[IU]/mL (ref 0.35–5.50)

## 2011-06-02 LAB — T4, FREE: Free T4: 1.09 ng/dL (ref 0.60–1.60)

## 2011-06-02 NOTE — Patient Instructions (Addendum)
blood tests are being requested for you today.  You will receive a letter with results. Please come back for a follow-up appointment in 6 weeks  

## 2011-06-02 NOTE — Progress Notes (Signed)
  Subjective:    Patient ID: Sandy Barrett, female    DOB: 17-Mar-1932, 78 y.o.   MRN: [id number]  HPI Pt is 2 1/2 mos s/p i-131 rx for hyperthyroidism due to multinodular goiter.  pt states she feels well in general, except for a few arthralgias and hair loss.  Past Medical History  Diagnosis Date  . HYPERTENSION   . HYPERTHYROIDISM   . OBESITY   . ARTHRITIS   . Posttraumatic stress disorder   . INSOMNIA   . MIGRAINE HEADACHE     Past Surgical History  Procedure Date  . Abdominal hysterectomy 1976  . Total hip arthroplasty 1998     right  . Tonsillectomy   . Breast surgery     biopsy    History   Social History  . Marital Status: Widowed    Spouse Name: N/A    Number of Children: N/A  . Years of Education: N/A   Occupational History  . Not on file.   Social History Main Topics  . Smoking status: Never Smoker   . Smokeless tobacco: Not on file  . Alcohol Use: No  . Drug Use: No  . Sexually Active:    Other Topics Concern  . Not on file   Social History Narrative  . No narrative on file    Current Outpatient Prescriptions on File Prior to Visit  Medication Sig Dispense Refill  . acetaminophen (TYLENOL ARTHRITIS PAIN) 650 MG CR tablet Take 650 mg by mouth 2 (two) times daily.        [name] etodolac (LODINE) 500 MG tablet Take 1 by mouth twice a day as needed      . hydrochlorothiazide (HYDRODIURIL) 25 MG tablet TAKE 1 TABLET BY MOUTH EVERY DAY  30 tablet  5  . oxyCODONE-acetaminophen (PERCOCET) 7.5-325 MG per tablet Take 1 by mouth three times a day as needed      . Probiotic Product (ALIGN) 4 MG CAPS Take by mouth daily.        . propranolol (INDERAL) 20 MG tablet TAKE 1 TABLET BY MOUTH TWICE A DAY  60 tablet  5  . traZODone (DESYREL) 50 MG tablet Take 1 tablet (50 mg total) by mouth at bedtime.  30 tablet  0  . traMADol (ULTRAM) 50 MG tablet         No Known Allergies  Family History  Problem Relation Age of Onset  . Asthma Mother   . Asthma Father   .  Prostate cancer Father     BP 110/66  Pulse 70  Temp(Src) 98.2 F (36.8 C) (Oral)  Ht 5\' 4"  (1.626 m)  Wt 165 lb (74.844 kg)  BMI 28.32 kg/m2  SpO2 97%  Review of Systems Denies weight change    Objective:   Physical Exam VITAL SIGNS:  See vs page GENERAL: no distress Neck: the thyroid is 5 normal size, with a multinodular surface.     Assessment & Plan:  Hyperthyroidism, due to multinodular goiter, s/p i-131 rx

## 2011-06-03 ENCOUNTER — Telehealth: Payer: Self-pay | Admitting: *Deleted

## 2011-06-03 NOTE — Telephone Encounter (Signed)
Called pt to inform of lab results, left message for pt to callback office (letter also mailed to pt). 

## 2011-06-04 NOTE — Telephone Encounter (Signed)
Not due to the thyroid

## 2011-06-04 NOTE — Telephone Encounter (Signed)
Pt informed of lab results. Pt states that she is losing hair and wants to know if her thyroid could be causing it.

## 2011-06-04 NOTE — Telephone Encounter (Signed)
Pt informed of MD's advisement. 

## 2011-07-14 ENCOUNTER — Ambulatory Visit (INDEPENDENT_AMBULATORY_CARE_PROVIDER_SITE_OTHER): Payer: Medicare Other | Admitting: Endocrinology

## 2011-07-14 ENCOUNTER — Encounter: Payer: Self-pay | Admitting: Endocrinology

## 2011-07-14 ENCOUNTER — Other Ambulatory Visit (INDEPENDENT_AMBULATORY_CARE_PROVIDER_SITE_OTHER): Payer: Medicare Other

## 2011-07-14 VITALS — BP 108/72 | HR 72 | Temp 97.9°F | Ht 64.0 in | Wt 164.0 lb

## 2011-07-14 DIAGNOSIS — E0590 Thyrotoxicosis, unspecified without thyrotoxic crisis or storm: Secondary | ICD-10-CM

## 2011-07-14 LAB — TSH: TSH: 0.54 u[IU]/mL (ref 0.35–5.50)

## 2011-07-14 NOTE — Progress Notes (Signed)
  Subjective:    Patient ID: Sandy Barrett, female    DOB: [birth year], 78 y.o.   MRN: [id number]  HPI Pt is 4 mos s/p i-131 rx for hyperthyroidism due to multinodular goiter.  pt states she feels well in general, except for arthralgias.   Past Medical History  Diagnosis Date  . HYPERTENSION   . HYPERTHYROIDISM   . OBESITY   . ARTHRITIS   . Posttraumatic stress disorder   . INSOMNIA   . MIGRAINE HEADACHE     Past Surgical History  Procedure Date  . Abdominal hysterectomy 1976  . Total hip arthroplasty 1998     right  . Tonsillectomy   . Breast surgery     biopsy    History   Social History  . Marital Status: Widowed    Spouse Name: N/A    Number of Children: N/A  . Years of Education: N/A   Occupational History  . Not on file.   Social History Main Topics  . Smoking status: Never Smoker   . Smokeless tobacco: Not on file  . Alcohol Use: No  . Drug Use: No  . Sexually Active:    Other Topics Concern  . Not on file   Social History Narrative  . No narrative on file    Current Outpatient Prescriptions on File Prior to Visit  Medication Sig Dispense Refill  . acetaminophen (TYLENOL ARTHRITIS PAIN) 650 MG CR tablet Take 650 mg by mouth 2 (two) times daily.        [name] etodolac (LODINE) 500 MG tablet Take 1 by mouth twice a day as needed      . hydrochlorothiazide (HYDRODIURIL) 25 MG tablet TAKE 1 TABLET BY MOUTH EVERY DAY  30 tablet  5  . Probiotic Product (ALIGN) 4 MG CAPS Take by mouth daily.        . propranolol (INDERAL) 20 MG tablet TAKE 1 TABLET BY MOUTH TWICE A DAY  60 tablet  5  . traZODone (DESYREL) 50 MG tablet Take 1 tablet (50 mg total) by mouth at bedtime.  30 tablet  0  . oxyCODONE-acetaminophen (PERCOCET) 7.5-325 MG per tablet Take 1 by mouth three times a day as needed      . traMADol (ULTRAM) 50 MG tablet         No Known Allergies  Family History  Problem Relation Age of Onset  . Asthma Mother   . Asthma Father   . Prostate cancer Father      BP 108/72  Pulse 72  Temp 97.9 F (36.6 C) (Oral)  Ht 5\' 4"  (1.626 m)  Wt 164 lb (74.39 kg)  BMI 28.15 kg/m2  SpO2 97%  Review of Systems Denies fever    Objective:   Physical Exam VITAL SIGNS:  See vs page GENERAL: no distress Neck: the thyroid is 5 normal size, with a multinodular surface.   Lab Results  Component Value Date   TSH 0.54 07/14/2011      Assessment & Plan:  Hyperthyroidism, better with i-131 rx Multinodular goiter, persistent

## 2011-07-14 NOTE — Patient Instructions (Addendum)
blood tests are being requested for you today.  You will receive a letter with results. Please come back for a follow-up appointment in 2 months.   

## 2011-07-15 ENCOUNTER — Telehealth: Payer: Self-pay | Admitting: *Deleted

## 2011-07-15 NOTE — Telephone Encounter (Signed)
Called pt to inform of lab results, pt informed (letter also mailed to pt). 

## 2011-08-11 DIAGNOSIS — M25569 Pain in unspecified knee: Secondary | ICD-10-CM | POA: Diagnosis not present

## 2011-08-11 DIAGNOSIS — M1710 Unilateral primary osteoarthritis, unspecified knee: Secondary | ICD-10-CM | POA: Diagnosis not present

## 2011-09-15 ENCOUNTER — Other Ambulatory Visit (INDEPENDENT_AMBULATORY_CARE_PROVIDER_SITE_OTHER): Payer: Medicare Other

## 2011-09-15 ENCOUNTER — Encounter: Payer: Self-pay | Admitting: Endocrinology

## 2011-09-15 ENCOUNTER — Ambulatory Visit (INDEPENDENT_AMBULATORY_CARE_PROVIDER_SITE_OTHER): Payer: Medicare Other | Admitting: Endocrinology

## 2011-09-15 VITALS — BP 122/60 | HR 71 | Temp 97.0°F | Ht 64.0 in | Wt 165.0 lb

## 2011-09-15 DIAGNOSIS — E0590 Thyrotoxicosis, unspecified without thyrotoxic crisis or storm: Secondary | ICD-10-CM

## 2011-09-15 LAB — TSH: TSH: 1.14 u[IU]/mL (ref 0.35–5.50)

## 2011-09-15 NOTE — Patient Instructions (Addendum)
A thyroid blood test is being requested for you today.  You will receive a letter with results. Please come back for a follow-up appointment in 4 months.

## 2011-09-15 NOTE — Progress Notes (Signed)
  Subjective:    Patient ID: Sandy Barrett, female    DOB: 1932/12/18, 78 y.o.   MRN: [id number]  HPI Pt is 6 mos s/p i-131 rx for hyperthyroidism due to multinodular goiter.  pt states she feels well in general, except for chronic arthralgias.   Past Medical History  Diagnosis Date  . HYPERTENSION   . HYPERTHYROIDISM   . OBESITY   . ARTHRITIS   . Posttraumatic stress disorder   . INSOMNIA   . MIGRAINE HEADACHE     Past Surgical History  Procedure Date  . Abdominal hysterectomy 1976  . Total hip arthroplasty 1998     right  . Tonsillectomy   . Breast surgery     biopsy    History   Social History  . Marital Status: Widowed    Spouse Name: N/A    Number of Children: N/A  . Years of Education: N/A   Occupational History  . Not on file.   Social History Main Topics  . Smoking status: Never Smoker   . Smokeless tobacco: Not on file  . Alcohol Use: No  . Drug Use: No  . Sexually Active:    Other Topics Concern  . Not on file   Social History Narrative  . No narrative on file    Current Outpatient Prescriptions on File Prior to Visit  Medication Sig Dispense Refill  . etodolac (LODINE) 500 MG tablet Take 1 by mouth twice a day as needed      . hydrochlorothiazide (HYDRODIURIL) 25 MG tablet TAKE 1 TABLET BY MOUTH EVERY DAY  30 tablet  5  . oxyCODONE-acetaminophen (PERCOCET) 7.5-325 MG per tablet Take 1 by mouth three times a day as needed      . Probiotic Product (ALIGN) 4 MG CAPS Take by mouth daily.        . propranolol (INDERAL) 20 MG tablet TAKE 1 TABLET BY MOUTH TWICE A DAY  60 tablet  5  . traZODone (DESYREL) 50 MG tablet Take 1 tablet (50 mg total) by mouth at bedtime.  30 tablet  0   No Known Allergies  Family History  Problem Relation Age of Onset  . Asthma Mother   . Asthma Father   . Prostate cancer Father    BP 122/60  Pulse 71  Temp 97 F (36.1 C) (Oral)  Ht 5\' 4"  (1.626 m)  Wt 165 lb (74.844 kg)  BMI 28.32 kg/m2  SpO2 93%  Review  of Systems denies weight change    Objective:   Physical Exam VITAL SIGNS:  See vs page GENERAL: no distress Neck: the thyroid is 3 normal size, with a multinodular surface.    Lab Results  Component Value Date   TSH 1.14 09/15/2011      Assessment & Plan:  Hyperthyroidism, resolved with i-131 rx.  She is at risk for recurrence

## 2011-09-16 ENCOUNTER — Encounter: Payer: Self-pay | Admitting: Endocrinology

## 2011-09-17 ENCOUNTER — Telehealth: Payer: Self-pay | Admitting: *Deleted

## 2011-09-17 NOTE — Telephone Encounter (Signed)
Called pt to inform of lab results, pt informed (letter also mailed to pt). 

## 2011-10-01 ENCOUNTER — Ambulatory Visit (INDEPENDENT_AMBULATORY_CARE_PROVIDER_SITE_OTHER): Payer: Medicare Other | Admitting: Internal Medicine

## 2011-10-01 ENCOUNTER — Other Ambulatory Visit (INDEPENDENT_AMBULATORY_CARE_PROVIDER_SITE_OTHER): Payer: Medicare Other

## 2011-10-01 ENCOUNTER — Encounter: Payer: Self-pay | Admitting: Internal Medicine

## 2011-10-01 ENCOUNTER — Other Ambulatory Visit: Payer: Self-pay | Admitting: Internal Medicine

## 2011-10-01 VITALS — BP 120/80 | HR 77 | Temp 99.6°F | Resp 16 | Wt 165.2 lb

## 2011-10-01 DIAGNOSIS — R109 Unspecified abdominal pain: Secondary | ICD-10-CM | POA: Diagnosis not present

## 2011-10-01 DIAGNOSIS — K5732 Diverticulitis of large intestine without perforation or abscess without bleeding: Secondary | ICD-10-CM | POA: Diagnosis not present

## 2011-10-01 LAB — CBC WITH DIFFERENTIAL/PLATELET
Basophils Absolute: 0.1 10*3/uL (ref 0.0–0.1)
Basophils Relative: 1.2 % (ref 0.0–3.0)
Eosinophils Absolute: 0.2 10*3/uL (ref 0.0–0.7)
Eosinophils Relative: 4 % (ref 0.0–5.0)
HCT: 38.3 % (ref 36.0–46.0)
Hemoglobin: 12.4 g/dL (ref 12.0–15.0)
Lymphocytes Relative: 25.5 % (ref 12.0–46.0)
Lymphs Abs: 1.3 10*3/uL (ref 0.7–4.0)
MCHC: 32.4 g/dL (ref 30.0–36.0)
MCV: 93.4 fl (ref 78.0–100.0)
Monocytes Absolute: 0.4 10*3/uL (ref 0.1–1.0)
Monocytes Relative: 7.3 % (ref 3.0–12.0)
Neutro Abs: 3 10*3/uL (ref 1.4–7.7)
Neutrophils Relative %: 62 % (ref 43.0–77.0)
Platelets: 352 10*3/uL (ref 150.0–400.0)
RBC: 4.1 Mil/uL (ref 3.87–5.11)
RDW: 15.6 % — ABNORMAL HIGH (ref 11.5–14.6)
WBC: 4.9 10*3/uL (ref 4.5–10.5)

## 2011-10-01 LAB — BASIC METABOLIC PANEL
BUN: 16 mg/dL (ref 6–23)
CO2: 31 mEq/L (ref 19–32)
Calcium: 9.2 mg/dL (ref 8.4–10.5)
Chloride: 104 mEq/L (ref 96–112)
Creatinine, Ser: 0.5 mg/dL (ref 0.4–1.2)
GFR: 147.01 mL/min (ref >60.00)
Glucose, Bld: 93 mg/dL (ref 70–99)
Potassium: 4 mEq/L (ref 3.5–5.1)
Sodium: 142 mEq/L (ref 135–145)

## 2011-10-01 LAB — URINALYSIS, ROUTINE W REFLEX MICROSCOPIC
Hgb urine dipstick: NEGATIVE
Ketones, ur: NEGATIVE
Nitrite: NEGATIVE
Specific Gravity, Urine: 1.015 (ref 1.000–1.030)
Total Protein, Urine: NEGATIVE
Urine Glucose: NEGATIVE
Urobilinogen, UA: 0.2 (ref 0.0–1.0)
pH: 7 (ref 5.0–8.0)

## 2011-10-01 LAB — HEPATIC FUNCTION PANEL
ALT: 11 U/L (ref 0–35)
AST: 12 U/L (ref 0–37)
Albumin: 3.6 g/dL (ref 3.5–5.2)
Alkaline Phosphatase: 56 U/L (ref 39–117)
Bilirubin, Direct: 0.1 mg/dL (ref 0.0–0.3)
Total Bilirubin: 0.4 mg/dL (ref 0.3–1.2)
Total Protein: 6.9 g/dL (ref 6.0–8.3)

## 2011-10-01 MED ORDER — METRONIDAZOLE 500 MG PO TABS: 500.0000 mg | ORAL_TABLET | Freq: Three times a day (TID) | ORAL | Status: DC

## 2011-10-01 MED ORDER — CIPROFLOXACIN HCL 250 MG PO TABS: 250.0000 mg | ORAL_TABLET | Freq: Two times a day (BID) | ORAL | Status: DC

## 2011-10-01 NOTE — Progress Notes (Signed)
Subjective:    Patient ID: Sandy Barrett, female    DOB: October 05, 1932, 78 y.o.   MRN: [id number]  Abdominal Pain This is a recurrent problem. The current episode started in the past 7 days. The onset quality is sudden. The problem occurs constantly. The problem has been gradually worsening. The pain is located in the LLQ and RLQ. Associated symptoms include constipation and nausea. Pertinent negatives include no anorexia, belching, diarrhea, fever, headaches, hematochezia, hematuria or vomiting. Nothing aggravates the pain. The pain is relieved by nothing. Treatments tried: probiotics, diet changes. The treatment provided no relief. Prior diagnostic workup includes CT scan. Her past medical history is significant for gallstones and irritable bowel syndrome. There is no history of Crohn's disease, pancreatitis or ulcerative colitis.   also reviewed chronic medical issues:  Osteoarthritis - DJD R knee pain, R hip and R shoulder Follows with [facility] for same -  prev used topical voltaren gel but ran out - relafen, meloxicam and tramadol not helpful Uses glucosamine, edolac and percocet prn  hypertension - no problems with her medications - no chest pain, edema, headache or vision changes - 100% complaint   insomnia - improved with trial amitriptyline and/or trazodone - uses every 2-3 nights as needed - sometimes with "hangover" but this is improved with decrease dose trazodone. Requests refill  Hyperthyroidism, associated with multinodular goiter - eval and tx plans by endo reviewed 02/2011 - Abnormal TSH 08/2009 and 11/2009, mild - dx incidental labs- saw endo for same, US done 11/2009 and 02/2011 - planning I-131 ablation 03/2011  Chronic IBS - see above -associated with intermittent abdominal pain - CT AP 11/2009 unremarkable - seen for similar pain symptoms 03/2009, 08/2009, 11/2009 - The patient reports chronic constipation, but generally denies nausea, vomiting, diarrhea, hematochezia, and  hematemesis.  pain is worse with food. The pain is better with defecation and Align   Past Medical History  Diagnosis Date  . HYPERTENSION   . HYPERTHYROIDISM   . OBESITY   . ARTHRITIS   . Posttraumatic stress disorder   . INSOMNIA   . MIGRAINE HEADACHE     Review of Systems  Constitutional: Positive for fatigue. Negative for fever and appetite change.  Respiratory: Negative for shortness of breath and wheezing.   Cardiovascular: Negative for chest pain and palpitations.  Gastrointestinal: Positive for nausea, abdominal pain and constipation. Negative for vomiting, diarrhea, hematochezia and anorexia.  Genitourinary: Negative for hematuria.  Neurological: Negative for headaches.       Objective:   Physical Exam  BP 120/80  Pulse 77  Temp 99.6 F (37.6 C) (Oral)  Resp 16  Wt 165 lb 4 oz (74.957 kg)  SpO2 95% Wt Readings from Last 3 Encounters:  10/01/11 165 lb 4 oz (74.957 kg)  09/15/11 165 lb (74.844 kg)  07/14/11 164 lb (74.39 kg)   Constitutional: She is overweight, but appears well-developed and well-nourished. No distress. Neck: goiter nontender - Normal range of motion. Neck supple. No JVD present. No thyromegaly present.  Cardiovascular: Normal rate, regular rhythm and normal heart sounds.  No murmur heard. No BLE edema. Pulmonary/Chest: Effort normal and breath sounds normal. No respiratory distress. She has no wheezes.  Abdomen: S, ND, +BS, no mass -mildly tender over bilateral lower quadrant - left greater than right side  Musculoskeletal: Chronic arthritic changes and right frozen shoulder without change. no effusions. No gross deformities Psychiatric: She has a normal mood and affect. Her behavior is normal. Judgment and thought content normal.  Lab Results  Component Value Date   WBC 7.5 07/30/2010   HGB 12.7 07/30/2010   HCT 38.3 07/30/2010   PLT 441.0* 07/30/2010   CHOL 127 07/22/2010   TRIG 48.0 07/22/2010   HDL 53.30 07/22/2010   ALT 11 09/02/2009    AST 13 09/02/2009   NA 140 12/16/2010   K 3.6 12/16/2010   CL 102 12/16/2010   CREATININE 0.7 12/16/2010   BUN 18 12/16/2010   CO2 30 12/16/2010   TSH 1.14 09/15/2011      CT 11/08/09: IMPRESSION:    1.  Ventral hernia in the mid pelvis containing a nondilated small bowel loop and fat extending to the left. 2.  Single 6 mm gallstone.  No gallbladder wall thickening.  No ductal dilatation. 3.  Left renal parapelvic cysts. 4.  Multiple rectosigmoid colonic diverticula.   Assessment & Plan:  Acute abdominal pain - hx IBS but new flare/exac in past 48h with low grade fever  Prior CT 11/2009 unremarkable - reviewed today Check labs and UA -  Treat empirically for mild diverticulitis - cipro/flagyl

## 2011-10-01 NOTE — Patient Instructions (Signed)
It was good to see you today. We have reviewed your prior records including labs and tests today Test(s) ordered today. Your results will be released to MyChart (or called to you) after review, usually within 72hours after test completion. If any changes need to be made, you will be notified at that same time. Start Cipro and Flagyl for intestinal infection - Your prescription(s) have been submitted to your pharmacy. Please take as directed and contact our office if you believe you are having problem(s) with the medication(s). Diverticulitis Small pockets or "bubbles" can develop in the wall of the intestine. Diverticulitis is when those pockets become infected and inflamed. This causes stomach pain (usually on the left side). HOME CARE  Take all medicine as told by your doctor.   Try a clear liquid diet (broth, tea, or water) for as long as told by your doctor.   Keep all follow-up visits with your doctor.   You may be put on a low-fiber diet once you start feeling better. Here are foods that have low-fiber:   White breads, cereals, rice, and pasta.   Cooked fruits and vegetables or soft fresh fruits and vegetables without the skin.   Ground or well-cooked tender beef, ham, veal, lamb, pork, or poultry.   Eggs and seafood.   After you are doing well on the low-fiber diet, you may be put on a high-fiber diet. Here are ways to increase your fiber:   Choose whole-grain breads, cereals, pasta, and brown rice.   Choose fruits and vegetables with skin on. Do not overcook the vegetables.   Choose nuts, seeds, legumes, dried peas, beans, and lentils.   Look for food products that have more than 3 grams of fiber per serving on the food label.  GET HELP RIGHT AWAY IF:  Your pain does not get better or gets worse.   You have trouble eating food.   You are not pooping (having bowel movements) like normal.   You have a temperature by mouth above 102 F (38.9 C), not controlled by  medicine.   You keep throwing up (vomiting).   You have bloody or black, tarry poop (stools).   You are getting worse and not better.  MAKE SURE YOU:    Understand these instructions.   Will watch your condition.   Will get help right away if you are not doing well or get worse.  Document Released: 06/10/2007 Document Revised: 12/11/2010 Document Reviewed: 11/12/2008 [facility] Patient Information 2012 [place], [place].

## 2011-10-23 DIAGNOSIS — M1710 Unilateral primary osteoarthritis, unspecified knee: Secondary | ICD-10-CM | POA: Diagnosis not present

## 2011-11-30 ENCOUNTER — Other Ambulatory Visit: Payer: Self-pay | Admitting: Internal Medicine

## 2011-12-24 ENCOUNTER — Encounter: Payer: Self-pay | Admitting: Internal Medicine

## 2011-12-24 ENCOUNTER — Ambulatory Visit (INDEPENDENT_AMBULATORY_CARE_PROVIDER_SITE_OTHER): Payer: Medicare Other | Admitting: Internal Medicine

## 2011-12-24 VITALS — BP 112/72 | HR 74 | Temp 98.8°F | Ht 64.0 in | Wt 167.1 lb

## 2011-12-24 DIAGNOSIS — Z23 Encounter for immunization: Secondary | ICD-10-CM | POA: Diagnosis not present

## 2011-12-24 DIAGNOSIS — D179 Benign lipomatous neoplasm, unspecified: Secondary | ICD-10-CM | POA: Diagnosis not present

## 2011-12-24 DIAGNOSIS — E669 Obesity, unspecified: Secondary | ICD-10-CM

## 2011-12-24 DIAGNOSIS — M1990 Unspecified osteoarthritis, unspecified site: Secondary | ICD-10-CM

## 2011-12-24 DIAGNOSIS — Z0000 Encounter for general adult medical examination without abnormal findings: Secondary | ICD-10-CM

## 2011-12-24 DIAGNOSIS — I10 Essential (primary) hypertension: Secondary | ICD-10-CM

## 2011-12-24 NOTE — Assessment & Plan Note (Signed)
BP Readings from Last 3 Encounters:  12/24/11 112/72  10/01/11 120/80  09/15/11 122/60   The current medical regimen is effective;  continue present plan and medications.

## 2011-12-24 NOTE — Assessment & Plan Note (Signed)
Wt Readings from Last 3 Encounters:  12/24/11 167 lb 1.9 oz (75.805 kg)  10/01/11 165 lb 4 oz (74.957 kg)  09/15/11 165 lb (74.844 kg)   Weight trends reviewed The patient is asked to make an attempt to improve diet and exercise patterns to aid in medical management of this problem.

## 2011-12-24 NOTE — Patient Instructions (Signed)
It was good to see you today. Health Maintenance reviewed - all recommended immunizations and age-appropriate screenings are up-to-date. We have reviewed your prior records including labs and tests today Medications reviewed and updated, no changes at this time. Refill on medication(s) as discussed today. Use Biotene mouthwash as needed Your shoulder and stomach bumps are called "lipoma" - these are not cancer and are not a medical problem unless they cause you any symptoms - see below Please schedule followup in 4-6 months, call sooner if problems.  Health Maintenance, Females A healthy lifestyle and preventative care can promote health and wellness.  Maintain regular health, dental, and eye exams.   Eat a healthy diet. Foods like vegetables, fruits, whole grains, low-fat dairy products, and lean protein foods contain the nutrients you need without too many calories. Decrease your intake of foods high in solid fats, added sugars, and salt. Get information about a proper diet from your caregiver, if necessary.   Regular physical exercise is one of the most important things you can do for your health. Most adults should get at least 150 minutes of moderate-intensity exercise (any activity that increases your heart rate and causes you to sweat) each week. In addition, most adults need muscle-strengthening exercises on 2 or more days a week.     Maintain a healthy weight. The body mass index (BMI) is a screening tool to identify possible weight problems. It provides an estimate of body fat based on height and weight. Your caregiver can help determine your BMI, and can help you achieve or maintain a healthy weight. For adults 20 years and older:   A BMI below 18.5 is considered underweight.   A BMI of 18.5 to 24.9 is normal.   A BMI of 25 to 29.9 is considered overweight.   A BMI of 30 and above is considered obese.   Maintain normal blood lipids and cholesterol by exercising and minimizing  your intake of saturated fat. Eat a balanced diet with plenty of fruits and vegetables. Blood tests for lipids and cholesterol should begin at age 54 and be repeated every 5 years. If your lipid or cholesterol levels are high, you are over 50, or you are a high risk for heart disease, you may need your cholesterol levels checked more frequently. Ongoing high lipid and cholesterol levels should be treated with medicines if diet and exercise are not effective.   If you smoke, find out from your caregiver how to quit. If you do not use tobacco, do not start.   If you are pregnant, do not drink alcohol. If you are breastfeeding, be very cautious about drinking alcohol. If you are not pregnant and choose to drink alcohol, do not exceed 1 drink per day. One drink is considered to be 12 ounces (355 mL) of beer, 5 ounces (148 mL) of wine, or 1.5 ounces (44 mL) of liquor.   Avoid use of street drugs. Do not share needles with anyone. Ask for help if you need support or instructions about stopping the use of drugs.   High blood pressure causes heart disease and increases the risk of stroke. Blood pressure should be checked at least every 1 to 2 years. Ongoing high blood pressure should be treated with medicines, if weight loss and exercise are not effective.   If you are 37 to 76 years old, ask your caregiver if you should take aspirin to prevent strokes.   Diabetes screening involves taking a blood sample to check your fasting  blood sugar level. This should be done once every 3 years, after age 57, if you are within normal weight and without risk factors for diabetes. Testing should be considered at a younger age or be carried out more frequently if you are overweight and have at least 1 risk factor for diabetes.   Breast cancer screening is essential preventative care for women. You should practice "breast self-awareness." This means understanding the normal appearance and feel of your breasts and may include  breast self-examination. Any changes detected, no matter how small, should be reported to a caregiver. Women in their 15s and 30s should have a clinical breast exam (CBE) by a caregiver as part of a regular health exam every 1 to 3 years. After age 67, women should have a CBE every year. Starting at age 63, women should consider having a mammogram (breast X-ray) every year. Women who have a family history of breast cancer should talk to their caregiver about genetic screening. Women at a high risk of breast cancer should talk to their caregiver about having an MRI and a mammogram every year.   The Pap test is a screening test for cervical cancer. Women should have a Pap test starting at age 26. Between ages 64 and 15, Pap tests should be repeated every 2 years. Beginning at age 6, you should have a Pap test every 3 years as long as the past 3 Pap tests have been normal. If you had a hysterectomy for a problem that was not cancer or a condition that could lead to cancer, then you no longer need Pap tests. If you are between ages 8 and 71, and you have had normal Pap tests going back 10 years, you no longer need Pap tests. If you have had past treatment for cervical cancer or a condition that could lead to cancer, you need Pap tests and screening for cancer for at least 20 years after your treatment. If Pap tests have been discontinued, risk factors (such as a new sexual partner) need to be reassessed to determine if screening should be resumed. Some women have medical problems that increase the chance of getting cervical cancer. In these cases, your caregiver may recommend more frequent screening and Pap tests.   The human papillomavirus (HPV) test is an additional test that may be used for cervical cancer screening. The HPV test looks for the virus that can cause the cell changes on the cervix. The cells collected during the Pap test can be tested for HPV. The HPV test could be used to screen women aged 92  years and older, and should be used in women of any age who have unclear Pap test results. After the age of 77, women should have HPV testing at the same frequency as a Pap test.   Colorectal cancer can be detected and often prevented. Most routine colorectal cancer screening begins at the age of 15 and continues through age 4. However, your caregiver may recommend screening at an earlier age if you have risk factors for colon cancer. On a yearly basis, your caregiver may provide home test kits to check for hidden blood in the stool. Use of a small camera at the end of a tube, to directly examine the colon (sigmoidoscopy or colonoscopy), can detect the earliest forms of colorectal cancer. Talk to your caregiver about this at age 89, when routine screening begins. Direct examination of the colon should be repeated every 5 to 10 years through age 34, unless  early forms of pre-cancerous polyps or small growths are found.   Hepatitis C blood testing is recommended for all people born from [birth year] through 1963 and any individual with known risks for hepatitis C.   Practice safe sex. Use condoms and avoid high-risk sexual practices to reduce the spread of sexually transmitted infections (STIs). Sexually active women aged 82 and younger should be checked for Chlamydia, which is a common sexually transmitted infection. Older women with new or multiple partners should also be tested for Chlamydia. Testing for other STIs is recommended if you are sexually active and at increased risk.   Osteoporosis is a disease in which the bones lose minerals and strength with aging. This can result in serious bone fractures. The risk of osteoporosis can be identified using a bone density scan. Women ages 30 and over and women at risk for fractures or osteoporosis should discuss screening with their caregivers. Ask your caregiver whether you should be taking a calcium supplement or vitamin D to reduce the rate of osteoporosis.    Menopause can be associated with physical symptoms and risks. Hormone replacement therapy is available to decrease symptoms and risks. You should talk to your caregiver about whether hormone replacement therapy is right for you.   Use sunscreen with a sun protection factor (SPF) of 30 or greater. Apply sunscreen liberally and repeatedly throughout the day. You should seek shade when your shadow is shorter than you. Protect yourself by wearing long sleeves, pants, a wide-brimmed hat, and sunglasses year round, whenever you are outdoors.   Notify your caregiver of new moles or changes in moles, especially if there is a change in shape or color. Also notify your caregiver if a mole is larger than the size of a pencil eraser.   Stay current with your immunizations.  Document Released: 07/07/2010 Document Revised: 03/16/2011 Document Reviewed: 07/07/2010 [facility] Patient Information 2013 [place], [place].   Lipoma A lipoma is a noncancerous (benign) tumor composed of fat cells. They are usually found under the skin (subcutaneous). A lipoma may occur in any tissue of the body that contains fat. Common areas for lipomas to appear include the back, shoulders, buttocks, and thighs. Lipomas are a very common soft tissue growth. They are soft and grow slowly. Most problems caused by a lipoma depend on where it is growing. DIAGNOSIS   A lipoma can be diagnosed with a physical exam. These tumors rarely become cancerous, but radiographic studies can help determine this for certain. Studies used may include:  Computerized X-ray scans (CT or CAT scan).   Computerized magnetic scans (MRI).  TREATMENT   Small lipomas that are not causing problems may be watched. If a lipoma continues to enlarge or causes problems, removal is often the best treatment. Lipomas can also be removed to improve appearance. Surgery is done to remove the fatty cells and the surrounding capsule. Most often, this is done with medicine that  numbs the area (local anesthetic). The removed tissue is examined under a microscope to make sure it is not cancerous. Keep all follow-up appointments with your caregiver. SEEK MEDICAL CARE IF:    The lipoma becomes larger or hard.   The lipoma becomes painful, red, or increasingly swollen. These could be signs of infection or a more serious condition.  Document Released: 12/12/2001 Document Revised: 03/16/2011 Document Reviewed: 05/24/2009 [facility] Patient Information 2013 [place], [place].

## 2011-12-24 NOTE — Assessment & Plan Note (Signed)
pain in R shoulder - degenerative tear RTC on MRI - Follows periodically with [facility] and [facility] [facility] for same - wishes to avoid surgery Continue NSAIDs, narc and tylenol as ongoing - reassurance provided Also knee pains - no effusions

## 2011-12-24 NOTE — Progress Notes (Signed)
Subjective:    Patient ID: Sandy Barrett, female    DOB: 03/13/1932, 78 y.o.   MRN: [id number]  HPI   Here for medicare wellness  Diet: heart healthy  Physical activity: sedentary Depression/mood screen: negative Hearing: intact to whispered voice Visual acuity: grossly normal, performs annual eye exam  ADLs: capable Fall risk: none Home safety: good Cognitive evaluation: intact to orientation, naming, recall and repetition EOL planning: adv directives, full code/ I agree  I have personally reviewed and have noted 1. The patient's medical and social history 2. Their use of alcohol, tobacco or illicit drugs 3. Their current medications and supplements 4. The patient's functional ability including ADL's, fall risks, home safety risks and hearing or visual impairment. 5. Diet and physical activities 6. Evidence for depression or mood disorders   Also reviewed chronic medical issues:  Osteoarthritis - DJD R knee pain, R hip and R shoulder Follows with [facility] for same -  prev used topical voltaren gel but ran out - relafen, meloxicam and tramadol not helpful Uses glucosamine, edolac and percocet prn  hypertension - no problems with her medications - no chest pain, edema, headache or vision changes - 100% complaint   insomnia - improved with trial amitriptyline and/or trazodone - uses every 2-3 nights as needed -   Hyperthyroidism, associated with multinodular goiter - eval and tx plans by endo reviewed 02/2011 - Abnormal TSH 08/2009 and 11/2009, mild - dx incidental labs- saw endo for same, US done 11/2009 and 02/2011 - s/p I-131 ablation 03/2011  Chronic IBS - associated with intermittent abdominal pain - CT AP 11/2009 unremarkable - seen for similar pain symptoms 03/2009, 08/2009, 11/2009 - The patient reports chronic constipation, but denies nausea, vomiting, diarrhea, hematochezia, and hematemesis.  pain is worse with food. The pain is better with defecation and Align    Past Medical History  Diagnosis Date  . HYPERTENSION   . HYPERTHYROIDISM   . OBESITY   . ARTHRITIS   . Posttraumatic stress disorder   . INSOMNIA   . MIGRAINE HEADACHE    Family History  Problem Relation Age of Onset  . Asthma Mother   . Asthma Father   . Prostate cancer Father    History  Substance Use Topics  . Smoking status: Never Smoker   . Smokeless tobacco: Not on file  . Alcohol Use: No    Review of Systems  Constitutional: Positive for fatigue. Negative for fever and appetite change.  Respiratory: Negative for shortness of breath and wheezing.   Cardiovascular: Negative for chest pain and palpitations.  Neurological: Negative for headaches.  No other specific complaints in a complete review of systems (except as listed in HPI above).      Objective:   Physical Exam  BP 112/72  Pulse 74  Temp 98.8 F (37.1 C) (Oral)  Ht 5\' 4"  (1.626 m)  Wt 167 lb 1.9 oz (75.805 kg)  BMI 28.69 kg/m2  SpO2 97% Wt Readings from Last 3 Encounters:  12/24/11 167 lb 1.9 oz (75.805 kg)  10/01/11 165 lb 4 oz (74.957 kg)  09/15/11 165 lb (74.844 kg)   Constitutional: She is overweight, but appears well-developed and well-nourished. No distress. Neck: goiter nontender - no discrete mass -Normal range of motion. Neck supple. No JVD present. No thyromegaly present.  Cardiovascular: Normal rate, regular rhythm and normal heart sounds.  No murmur heard. No BLE edema. Pulmonary/Chest: Effort normal and breath sounds normal. No respiratory distress. She has no wheezes.  Musculoskeletal: Chronic arthritic changes and right frozen shoulder without change. no effusions. No gross deformities - large lipoma R posterior scapula location Psychiatric: She has a normal mood and affect. Her behavior is normal. Judgment and thought content normal.   Lab Results  Component Value Date   WBC 4.9 10/01/2011   HGB 12.4 10/01/2011   HCT 38.3 10/01/2011   PLT 352.0 10/01/2011   CHOL 127 07/22/2010    TRIG 48.0 07/22/2010   HDL 53.30 07/22/2010   ALT 11 10/01/2011   AST 12 10/01/2011   NA 142 10/01/2011   K 4.0 10/01/2011   CL 104 10/01/2011   CREATININE 0.5 10/01/2011   BUN 16 10/01/2011   CO2 31 10/01/2011   TSH 1.14 09/15/2011      US Soft Tissue Head/neck  02/24/2011  *RADIOLOGY REPORT*  Clinical Data: Multinodular goiter.  THYROID ULTRASOUND  Technique: Ultrasound examination of the thyroid gland and adjacent soft tissues was performed.  Comparison:  Ultrasound 11/19/2009, CT chest 12/18/2010  Findings:  Right thyroid lobe:  7.6 x 2.8 x 4.2 cm.  Left thyroid lobe:  10.2 x 5.2 x 6.6 cm.  Left lobe is diffusely replaced by heterogeneous tissue.  It is difficult to identify a discrete nodule on the left. Isthmus:  3 mm  Focal nodules:  Right upper pole nodule is solid and measures 8 x 11 mm.  Right lower pole nodule is mostly solid and measures 28 x 18 x 23 mm.  4 mm calcification right lower pole.  Left lobe is heterogeneous and markedly enlarged and extends substernally.  Large substernal goiter is noted on the prior chest CT.  It is difficult to measure a discrete nodule on the left.  Lymphadenopathy:  None visualized.  There is a partially occlusive thrombus in the left jugular vein. This is likely due to extrinsic compression from a large substernal goiter.  Review of the chest CT   does not show definite flow in the left jugular vein.   There is also extrinsic compression of the right jugular vein which may show slight flow on the CT.  IMPRESSION: Large heterogeneous goiter on the left extending substernally and not fully evaluated by ultrasound.  Substernal goiter is compressing the left jugular vein with thrombus in the left jugular vein.  Stable right thyroid nodules.  I discussed the findings by telephone with Dr. Everardo All on 02/24/2011 at 1200 hours.  Original Report Authenticated By: Camelia Phenes, M.D.   Assessment & Plan:  AWV/v70.0 -Today patient counseled on age appropriate routine  health concerns for screening and prevention, each reviewed and up to date or declined. Immunizations reviewed and up to date or declined. Labs/ECG reviewed. Risk factors for depression reviewed and negative. Hearing function and visual acuity are intact. ADLs screened and addressed as needed. Functional ability and level of safety reviewed and appropriate. Education, counseling and referrals performed based on assessed risks today. Patient provided with a copy of personalized plan for preventive services.  Also see problem list. Medications and labs reviewed today.  Lipoma - R posterior scapula location, large size but uncomplicated - education and reassurance provided

## 2012-01-04 ENCOUNTER — Other Ambulatory Visit: Payer: Self-pay | Admitting: Internal Medicine

## 2012-01-04 ENCOUNTER — Other Ambulatory Visit: Payer: Self-pay | Admitting: *Deleted

## 2012-01-04 MED ORDER — BUTALBITAL-APAP-CAFFEINE 50-325-40 MG PO TABS: ORAL_TABLET | ORAL | Status: DC

## 2012-01-07 ENCOUNTER — Other Ambulatory Visit: Payer: Self-pay | Admitting: Internal Medicine

## 2012-01-08 NOTE — Telephone Encounter (Signed)
Faxed script back to [facility].../lmb 

## 2012-04-01 ENCOUNTER — Other Ambulatory Visit: Payer: Self-pay | Admitting: Internal Medicine

## 2012-04-19 DIAGNOSIS — M1710 Unilateral primary osteoarthritis, unspecified knee: Secondary | ICD-10-CM | POA: Diagnosis not present

## 2012-04-19 DIAGNOSIS — M25569 Pain in unspecified knee: Secondary | ICD-10-CM | POA: Diagnosis not present

## 2012-04-28 ENCOUNTER — Emergency Department (HOSPITAL_COMMUNITY): Payer: Medicare Other

## 2012-04-28 ENCOUNTER — Emergency Department (HOSPITAL_COMMUNITY)
Admission: EM | Admit: 2012-04-28 | Discharge: 2012-04-28 | Disposition: A | Discharge status: Home / Self Care | Payer: Medicare Other | Attending: Emergency Medicine | Admitting: Emergency Medicine

## 2012-04-28 ENCOUNTER — Encounter (HOSPITAL_COMMUNITY): Payer: Self-pay | Admitting: *Deleted

## 2012-04-28 DIAGNOSIS — M129 Arthropathy, unspecified: Secondary | ICD-10-CM | POA: Insufficient documentation

## 2012-04-28 DIAGNOSIS — Z8669 Personal history of other diseases of the nervous system and sense organs: Secondary | ICD-10-CM | POA: Diagnosis not present

## 2012-04-28 DIAGNOSIS — R1084 Generalized abdominal pain: Secondary | ICD-10-CM | POA: Diagnosis not present

## 2012-04-28 DIAGNOSIS — I10 Essential (primary) hypertension: Secondary | ICD-10-CM | POA: Diagnosis not present

## 2012-04-28 DIAGNOSIS — E669 Obesity, unspecified: Secondary | ICD-10-CM | POA: Diagnosis not present

## 2012-04-28 DIAGNOSIS — Z862 Personal history of diseases of the blood and blood-forming organs and certain disorders involving the immune mechanism: Secondary | ICD-10-CM | POA: Insufficient documentation

## 2012-04-28 DIAGNOSIS — Z90710 Acquired absence of both cervix and uterus: Secondary | ICD-10-CM | POA: Insufficient documentation

## 2012-04-28 DIAGNOSIS — R110 Nausea: Secondary | ICD-10-CM | POA: Diagnosis not present

## 2012-04-28 DIAGNOSIS — Z8659 Personal history of other mental and behavioral disorders: Secondary | ICD-10-CM | POA: Diagnosis not present

## 2012-04-28 DIAGNOSIS — R109 Unspecified abdominal pain: Secondary | ICD-10-CM | POA: Diagnosis not present

## 2012-04-28 DIAGNOSIS — N390 Urinary tract infection, site not specified: Secondary | ICD-10-CM | POA: Diagnosis not present

## 2012-04-28 DIAGNOSIS — G43909 Migraine, unspecified, not intractable, without status migrainosus: Secondary | ICD-10-CM | POA: Diagnosis not present

## 2012-04-28 DIAGNOSIS — Z79899 Other long term (current) drug therapy: Secondary | ICD-10-CM | POA: Insufficient documentation

## 2012-04-28 DIAGNOSIS — Z8639 Personal history of other endocrine, nutritional and metabolic disease: Secondary | ICD-10-CM | POA: Insufficient documentation

## 2012-04-28 LAB — CBC WITH DIFFERENTIAL/PLATELET
Basophils Absolute: 0 10*3/uL (ref 0.0–0.1)
Basophils Relative: 0 % (ref 0–1)
Eosinophils Absolute: 0.1 10*3/uL (ref 0.0–0.7)
Eosinophils Relative: 1 % (ref 0–5)
HCT: 39.8 % (ref 36.0–46.0)
Hemoglobin: 13.3 g/dL (ref 12.0–15.0)
Lymphocytes Relative: 11 % — ABNORMAL LOW (ref 12–46)
Lymphs Abs: 0.8 10*3/uL (ref 0.7–4.0)
MCH: 30.9 pg (ref 26.0–34.0)
MCHC: 33.4 g/dL (ref 30.0–36.0)
MCV: 92.3 fL (ref 78.0–100.0)
Monocytes Absolute: 0.3 10*3/uL (ref 0.1–1.0)
Monocytes Relative: 4 % (ref 3–12)
Neutro Abs: 6.2 10*3/uL (ref 1.7–7.7)
Neutrophils Relative %: 84 % — ABNORMAL HIGH (ref 43–77)
Platelets: 351 10*3/uL (ref 150–400)
RBC: 4.31 MIL/uL (ref 3.87–5.11)
RDW: 15.2 % (ref 11.5–15.5)
WBC: 7.3 10*3/uL (ref 4.0–10.5)

## 2012-04-28 LAB — COMPREHENSIVE METABOLIC PANEL
ALT: 7 U/L (ref 0–35)
AST: 12 U/L (ref 0–37)
Albumin: 3.5 g/dL (ref 3.5–5.2)
Alkaline Phosphatase: 58 U/L (ref 39–117)
BUN: 18 mg/dL (ref 6–23)
CO2: 32 mEq/L (ref 19–32)
Calcium: 9.9 mg/dL (ref 8.4–10.5)
Chloride: 100 mEq/L (ref 96–112)
Creatinine, Ser: 0.74 mg/dL (ref 0.50–1.10)
GFR calc Af Amer: >90 mL/min (ref >90)
GFR calc non Af Amer: 80 mL/min — ABNORMAL LOW (ref >90)
Glucose, Bld: 128 mg/dL — ABNORMAL HIGH (ref 70–99)
Potassium: 3.4 mEq/L — ABNORMAL LOW (ref 3.5–5.1)
Sodium: 140 mEq/L (ref 135–145)
Total Bilirubin: 0.4 mg/dL (ref 0.3–1.2)
Total Protein: 6.7 g/dL (ref 6.0–8.3)

## 2012-04-28 LAB — URINALYSIS, MICROSCOPIC ONLY
Bilirubin Urine: NEGATIVE
Glucose, UA: NEGATIVE mg/dL
Hgb urine dipstick: NEGATIVE
Ketones, ur: NEGATIVE mg/dL
Nitrite: POSITIVE — AB
Protein, ur: NEGATIVE mg/dL
Specific Gravity, Urine: 1.024 (ref 1.005–1.030)
Urobilinogen, UA: 0.2 mg/dL (ref 0.0–1.0)
pH: 6.5 (ref 5.0–8.0)

## 2012-04-28 LAB — POCT I-STAT TROPONIN I: Troponin i, poc: 0 ng/mL (ref 0.00–0.08)

## 2012-04-28 LAB — LIPASE, BLOOD: Lipase: 29 U/L (ref 11–59)

## 2012-04-28 MED ORDER — NITROFURANTOIN MONOHYD MACRO 100 MG PO CAPS: 100.0000 mg | ORAL_CAPSULE | Freq: Two times a day (BID) | ORAL | Status: DC

## 2012-04-28 MED ORDER — ONDANSETRON HCL 4 MG/2ML IJ SOLN
4.0000 mg | Freq: Once | INTRAMUSCULAR | Status: AC
  Administered 2012-04-28: 4 mg via INTRAVENOUS
  Filled 2012-04-28: qty 2

## 2012-04-28 MED ORDER — DICYCLOMINE HCL 10 MG PO CAPS
10.0000 mg | ORAL_CAPSULE | Freq: Once | ORAL | Status: AC
  Administered 2012-04-28: 10 mg via ORAL
  Filled 2012-04-28: qty 1

## 2012-04-28 MED ORDER — NITROFURANTOIN MONOHYD MACRO 100 MG PO CAPS
100.0000 mg | ORAL_CAPSULE | Freq: Once | ORAL | Status: AC
  Administered 2012-04-28: 100 mg via ORAL
  Filled 2012-04-28: qty 1

## 2012-04-28 MED ORDER — PHENAZOPYRIDINE HCL 200 MG PO TABS: 200.0000 mg | ORAL_TABLET | Freq: Three times a day (TID) | ORAL | Status: DC

## 2012-04-28 MED ORDER — IBUPROFEN 200 MG PO TABS
600.0000 mg | ORAL_TABLET | Freq: Once | ORAL | Status: AC
  Administered 2012-04-28: 600 mg via ORAL
  Filled 2012-04-28: qty 3

## 2012-04-28 MED ORDER — PHENAZOPYRIDINE HCL 200 MG PO TABS
200.0000 mg | ORAL_TABLET | Freq: Three times a day (TID) | ORAL | Status: DC
  Administered 2012-04-28: 200 mg via ORAL
  Filled 2012-04-28: qty 1

## 2012-04-28 NOTE — ED Notes (Signed)
Onset of abd pain around 9:30 pm, pain with nausea, generalized abd pain, last BM yesterday AM.

## 2012-04-28 NOTE — ED Notes (Signed)
Pt alert and oriented x4. Respirations even and unlabored. Bilateral rise and fall of chest. Skin warm and dry. In no acute distress. Denies needs.  

## 2012-04-28 NOTE — ED Notes (Signed)
EKG given to EDP, Bonk, MD.

## 2012-04-28 NOTE — ED Provider Notes (Signed)
History     CSN: [id number]  Arrival date & time 04/28/12  0215   First MD Initiated Contact with Patient 04/28/12 0340      Chief Complaint  Patient presents with  . Abdominal Pain  . Nausea    (Consider location/radiation/quality/duration/timing/severity/associated sxs/prior treatment) HPI Sandy Barrett is a 79 y.o. female presenting with pain in the abdomen starting about 2130 last night, she says it's been associated with nausea but no vomiting, no diarrhea, no melena or hematochezia. She indicates is generalized however when she points to it she points to the lower abdomen. Last had a bowel movement yesterday morning which was normal. She describes this as a "hard pain" it severe, nonradiating, she took some of her home pain medicine-Percocet with no relief. No dizziness, lightheadedness, chest pain, shortness of breath, back pain frequency or dysuria.  Past Medical History  Diagnosis Date  . HYPERTENSION   . HYPERTHYROIDISM   . OBESITY   . ARTHRITIS   . Posttraumatic stress disorder   . INSOMNIA   . MIGRAINE HEADACHE     Past Surgical History  Procedure Laterality Date  . Abdominal hysterectomy  1976  . Total hip arthroplasty  1998     right  . Tonsillectomy    . Breast surgery      biopsy    Family History  Problem Relation Age of Onset  . Asthma Mother   . Asthma Father   . Prostate cancer Father     History  Substance Use Topics  . Smoking status: Never Smoker   . Smokeless tobacco: Not on file  . Alcohol Use: No    OB History   Grav Para Term Preterm Abortions TAB SAB Ect Mult Living                  Review of Systems At least 10pt or greater review of systems completed and are negative except where specified in the HPI.  Allergies  Review of patient's allergies indicates no known allergies.  Home Medications   Current Outpatient Rx  Name  Route  Sig  Dispense  Refill  . etodolac (LODINE) 500 MG tablet   Oral   Take 500 mg by mouth 2  (two) times daily as needed. For pain         . hydrochlorothiazide (HYDRODIURIL) 25 MG tablet      TAKE 1 TABLET BY MOUTH EVERY DAY   30 tablet   5   . oxyCODONE-acetaminophen (PERCOCET) 7.5-325 MG per tablet      Take 1 by mouth three times a day as needed         . Probiotic Product (ALIGN) 4 MG CAPS   Oral   Take by mouth daily.           . propranolol (INDERAL) 20 MG tablet      TAKE 1 TABLET BY MOUTH TWICE A DAY   60 tablet   5   . traZODone (DESYREL) 50 MG tablet   Oral   Take 50 mg by mouth at bedtime as needed for sleep.         . butalbital-acetaminophen-caffeine (FIORICET, ESGIC) 50-325-40 MG per tablet      TAKE 1 TABLET BY MOUTH 2 (TWO) TIMES DAILY AS NEEDED FOR HEADACHE.   40 tablet   0     BP 135/98  Pulse 82  Temp(Src) 98.1 F (36.7 C) (Oral)  Resp 18  SpO2 97%  Physical Exam  Nursing notes  reviewed.  Electronic medical record reviewed. VITAL SIGNS:   Filed Vitals:   04/28/12 0219 04/28/12 0741  BP: 135/98 131/70  Pulse: 82 82  Temp: 98.1 F (36.7 C) 98.5 F (36.9 C)  TempSrc: Oral Oral  Resp: 18 23  SpO2: 97% 97%   CONSTITUTIONAL: Awake, oriented, appears non-toxic HENT: Atraumatic, normocephalic, oral mucosa pink and moist, airway patent. Nares patent without drainage. External ears normal. EYES: Conjunctiva clear, EOMI, PERRLA NECK: Trachea midline, non-tender, supple CARDIOVASCULAR: Normal heart rate, Normal rhythm, No murmurs, rubs, gallops PULMONARY/CHEST: Clear to auscultation, no rhonchi, wheezes, or rales. Symmetrical breath sounds. Non-tender. ABDOMINAL: Non-distended, obese, soft, mildly tender to palpation in the suprapubic region. No rebound or guarding, well-healed midline laparotomy scar. BS increased. NEUROLOGIC: Non-focal, moving all four extremities, no gross sensory or motor deficits. EXTREMITIES: No clubbing, cyanosis, or edema. Changes in the hands specially second MCP consistent with OA SKIN: Warm, Dry, No  erythema, No rash  ED Course  Procedures (including critical care time)  Labs Reviewed  CBC WITH DIFFERENTIAL - Abnormal; Notable for the following:    Neutrophils Relative 84 (*)    Lymphocytes Relative 11 (*)    All other components within normal limits  COMPREHENSIVE METABOLIC PANEL - Abnormal; Notable for the following:    Potassium 3.4 (*)    Glucose, Bld 128 (*)    GFR calc non Af Amer 80 (*)    All other components within normal limits  URINALYSIS, MICROSCOPIC ONLY - Abnormal; Notable for the following:    APPearance CLOUDY (*)    Nitrite POSITIVE (*)    Leukocytes, UA MODERATE (*)    Bacteria, UA MANY (*)    All other components within normal limits  URINE CULTURE  LIPASE, BLOOD  POCT I-STAT TROPONIN I   Dg Abd 2 Views  04/28/2012  *RADIOLOGY REPORT*  Clinical Data: Lower abdominal pain  ABDOMEN - 2 VIEW  Comparison: 11/08/2009 CT  Findings: Lung bases clear.  No free intraperitoneal air. The bowel gas pattern is non-obstructive. Organ outlines are normal where seen. No acute or aggressive osseous abnormality identified.  Right hip arthroplasty.  Moderate left hip degenerative change. Multilevel degenerative changes of the lumbar spine.  IMPRESSION: Nonobstructive bowel gas pattern.   Original Report Authenticated By: Jearld Lesch, M.D.      1. UTI (lower urinary tract infection)   2. Abdominal pain, lower, unspecified laterality       MDM  Patient presenting with suprapubic pain, no dysuria no frequency, UA consistent with urinary tract infection however. Also obtained x-ray the abdomen looking for obstruction versus constipation, is unremarkable. Patient is not a white count, she is afebrile, nontoxic in appearance signs are stable and within normal limits-there is no septic physiology she is now meets Sirs criteria do not think she has urosepsis. I do not think she has any other intra-abdominal emergency or surgical emergency at this time. We'll discharge the  patient home stable and in good condition with Macrobid followup urine culture.  I explained the diagnosis and have given explicit precautions to return to the ER including fevers, chills, confusion or any other new or worsening symptoms. The patient and her daughter understand and accept the medical plan as it's been dictated and I have answered their questions. Discharge instructions concerning home care and prescriptions have been given.  The patient is STABLE and is discharged to home in good condition.         Jones Skene, MD 04/28/12 [phone]

## 2012-04-28 NOTE — ED Notes (Signed)
MD at bedside. 

## 2012-04-28 NOTE — ED Notes (Signed)
Attempted to get labs.  Unable to obtain with IV stick,.  Notified minilab

## 2012-04-30 LAB — URINE CULTURE: Colony Count: >=100000

## 2012-05-01 ENCOUNTER — Telehealth (HOSPITAL_COMMUNITY): Payer: Self-pay | Admitting: Emergency Medicine

## 2012-05-01 NOTE — ED Notes (Signed)
+  Urine. Patient treated with Macrobid. Sensitive to same. Per protocol MD. °

## 2012-05-01 NOTE — ED Notes (Signed)
Patient has +Urine culture. °

## 2012-05-02 ENCOUNTER — Ambulatory Visit (INDEPENDENT_AMBULATORY_CARE_PROVIDER_SITE_OTHER): Payer: Medicare Other | Admitting: Internal Medicine

## 2012-05-02 ENCOUNTER — Encounter: Payer: Self-pay | Admitting: Internal Medicine

## 2012-05-02 VITALS — BP 120/72 | HR 68 | Temp 97.9°F | Wt 163.4 lb

## 2012-05-02 DIAGNOSIS — K589 Irritable bowel syndrome without diarrhea: Secondary | ICD-10-CM | POA: Diagnosis not present

## 2012-05-02 DIAGNOSIS — K5900 Constipation, unspecified: Secondary | ICD-10-CM | POA: Diagnosis not present

## 2012-05-02 DIAGNOSIS — R109 Unspecified abdominal pain: Secondary | ICD-10-CM

## 2012-05-02 DIAGNOSIS — B9620 Unspecified Escherichia coli [E. coli] as the cause of diseases classified elsewhere: Secondary | ICD-10-CM

## 2012-05-02 DIAGNOSIS — N390 Urinary tract infection, site not specified: Secondary | ICD-10-CM

## 2012-05-02 DIAGNOSIS — E0590 Thyrotoxicosis, unspecified without thyrotoxic crisis or storm: Secondary | ICD-10-CM

## 2012-05-02 DIAGNOSIS — A498 Other bacterial infections of unspecified site: Secondary | ICD-10-CM

## 2012-05-02 MED ORDER — TRAZODONE HCL 50 MG PO TABS: 50.0000 mg | ORAL_TABLET | Freq: Every evening | ORAL | Status: DC | PRN

## 2012-05-02 MED ORDER — POLYETHYLENE GLYCOL 3350 17 GM/SCOOP PO POWD: 17.0000 g | Freq: Every day | ORAL | Status: DC

## 2012-05-02 MED ORDER — SENNA 8.6 MG PO TABS: 1.0000 | ORAL_TABLET | Freq: Every day | ORAL | Status: DC

## 2012-05-02 NOTE — Progress Notes (Signed)
Subjective:    Patient ID: Sandy Barrett, female    DOB: 01-Dec-1932, 79 y.o.   MRN: [id number]  HPI   Here for ER follow up - 4/24 eval for abdominal pain - dx UTI - tx with nitrofurantoin for Escherichia coli sensitive to same - abdominal pain and cramping has improved, but continues to fight constipation  Also reviewed chronic medical issues:  Osteoarthritis - DJD R knee pain, R hip and R shoulder Follows with [facility] for same -  prev used topical voltaren gel but ran out - relafen, meloxicam and tramadol not helpful Uses glucosamine, edolac and percocet prn  hypertension - no problems with her medications - no chest pain, edema, headache or vision changes - 100% complaint   insomnia - improved with trial amitriptyline and/or trazodone - uses every 2-3 nights as needed -   Hyperthyroidism, associated with multinodular goiter - eval and tx plans by endo reviewed 02/2011 - Abnormal TSH 08/2009 and 11/2009, mild - dx incidental labs- saw endo for same, US done 11/2009 and 02/2011 - s/p I-131 ablation 03/2011  Chronic IBS - associated with intermittent abdominal pain - CT AP 11/2009 unremarkable - ER visit for same last week reviewed (04/28/12). The patient reports chronic constipation, but denies nausea, vomiting, diarrhea, hematochezia, and hematemesis.  pain is worse with food. The pain is better with defecation and Align   Past Medical History  Diagnosis Date  . HYPERTENSION   . HYPERTHYROIDISM   . OBESITY   . ARTHRITIS   . Posttraumatic stress disorder   . INSOMNIA   . MIGRAINE HEADACHE     Review of Systems  Constitutional: Positive for fatigue. Negative for fever and appetite change.  Respiratory: Negative for shortness of breath and wheezing.   Cardiovascular: Negative for chest pain and palpitations.  Neurological: Negative for headaches.       Objective:   Physical Exam  BP 120/72  Pulse 68  Temp(Src) 97.9 F (36.6 C) (Oral)  Wt 163 lb 6.4 oz (74.118  kg)  BMI 28.03 kg/m2  SpO2 96% Wt Readings from Last 3 Encounters:  05/02/12 163 lb 6.4 oz (74.118 kg)  12/24/11 167 lb 1.9 oz (75.805 kg)  10/01/11 165 lb 4 oz (74.957 kg)   Constitutional: She is overweight, but appears well-developed and well-nourished. No distress. Neck: goiter nontender - no discrete mass -Normal range of motion. Neck supple. No JVD present. No thyromegaly present.  Cardiovascular: Normal rate, regular rhythm and normal heart sounds.  No murmur heard. No BLE edema. Pulmonary/Chest: Effort normal and breath sounds normal. No respiratory distress. She has no wheezes.  Abdomen: SNTND, +BS - no mass, no r/g Psychiatric: She has a normal mood and affect. Her behavior is normal. Judgment and thought content normal.   Lab Results  Component Value Date   WBC 7.3 04/28/2012   HGB 13.3 04/28/2012   HCT 39.8 04/28/2012   PLT 351 04/28/2012   CHOL 127 07/22/2010   TRIG 48.0 07/22/2010   HDL 53.30 07/22/2010   ALT 7 04/28/2012   AST 12 04/28/2012   NA 140 04/28/2012   K 3.4* 04/28/2012   CL 100 04/28/2012   CREATININE 0.74 04/28/2012   BUN 18 04/28/2012   CO2 32 04/28/2012   TSH 1.14 09/15/2011        Assessment & Plan:   Escherichia coli UTI April 24, resolved with Macrobid therapy. No complications residual for same - advised to keep up with bowel regimen as constipation likely  contributing to abdominal pain and cramping symptoms   Also see problem list. Medications and labs reviewed today.

## 2012-05-02 NOTE — Patient Instructions (Signed)
It was good to see you today. We've reviewed your emergency room visit, lab testing and treatment - no changes recommended today. Complete antibiotics as prescribed Taking Senokot tablet every night at bedtime and use MiraLAX 1 scoop in 8 ounces of water each morning.  Save the "milk of magnesia" for treatment only if these therapies are ineffective at moving bowels Other medications reviewed and updated, refills provided as requested Keep followup as planned for blood pressure check and review (every 6 months), call sooner if problems

## 2012-05-02 NOTE — Assessment & Plan Note (Signed)
Abnormal mild suppressed TSH 08/2009 and 11/2009, 10/2010 S/p endo eval and [place] 11/2009 - unremarkable; then progression goiter noted on 02/2011 follow up -  s/p I-131 ablation 03/2011  Lab Results  Component Value Date   TSH 1.14 09/15/2011

## 2012-05-20 DIAGNOSIS — M1710 Unilateral primary osteoarthritis, unspecified knee: Secondary | ICD-10-CM | POA: Diagnosis not present

## 2012-06-02 ENCOUNTER — Other Ambulatory Visit: Payer: Self-pay | Admitting: Internal Medicine

## 2012-07-07 DIAGNOSIS — S86819A Strain of other muscle(s) and tendon(s) at lower leg level, unspecified leg, initial encounter: Secondary | ICD-10-CM | POA: Diagnosis not present

## 2012-07-07 DIAGNOSIS — S838X9A Sprain of other specified parts of unspecified knee, initial encounter: Secondary | ICD-10-CM | POA: Diagnosis not present

## 2012-07-07 DIAGNOSIS — M25569 Pain in unspecified knee: Secondary | ICD-10-CM | POA: Diagnosis not present

## 2012-08-31 ENCOUNTER — Encounter: Payer: Self-pay | Admitting: Internal Medicine

## 2012-08-31 ENCOUNTER — Ambulatory Visit (INDEPENDENT_AMBULATORY_CARE_PROVIDER_SITE_OTHER): Payer: Medicare Other | Admitting: Internal Medicine

## 2012-08-31 ENCOUNTER — Other Ambulatory Visit (INDEPENDENT_AMBULATORY_CARE_PROVIDER_SITE_OTHER): Payer: Medicare Other

## 2012-08-31 VITALS — BP 112/72 | HR 74 | Temp 98.2°F | Wt 164.1 lb

## 2012-08-31 DIAGNOSIS — Z23 Encounter for immunization: Secondary | ICD-10-CM | POA: Diagnosis not present

## 2012-08-31 DIAGNOSIS — I10 Essential (primary) hypertension: Secondary | ICD-10-CM

## 2012-08-31 DIAGNOSIS — E0590 Thyrotoxicosis, unspecified without thyrotoxic crisis or storm: Secondary | ICD-10-CM | POA: Diagnosis not present

## 2012-08-31 DIAGNOSIS — M1990 Unspecified osteoarthritis, unspecified site: Secondary | ICD-10-CM | POA: Diagnosis not present

## 2012-08-31 DIAGNOSIS — L600 Ingrowing nail: Secondary | ICD-10-CM

## 2012-08-31 NOTE — Assessment & Plan Note (Signed)
Multiple locations: R shoulder - degenerative tear RTC on MRI - Increasing knee symptoms, right greater than left -boggy synovitis but no effusion on exam -reports steroid injection on left may 2014 with minimal relief Follows with [facility] and [facility] [facility] for same - wishes to avoid surgery Previously on NSAIDs, changed to narc TID and tylenol only, ?resume NSAID low dose if Cr stable as no edema and blood pressure controlled - reassurance provided

## 2012-08-31 NOTE — Progress Notes (Signed)
  Subjective:    Patient ID: Sandy Barrett, female    DOB: Mar 27, 1932, 79 y.o.   MRN: [id number]  HPI  Here for follow up - reviewed chronic medical issues and interval medical events:  Osteoarthritis - DJD R knee pain, R hip and R shoulder Follows with [facility] for same -  prev used topical voltaren gel but ran out - relafen, meloxicam and tramadol not helpful Uses glucosamine and percocet prn - ?resume indocin  hypertension - no problems with her medications - no chest pain, edema, headache or vision changes - 100% complaint   insomnia - improved with trial amitriptyline and/or trazodone - uses every 2-3 nights as needed -   Hyperthyroidism, associated with multinodular goiter - eval and tx plans by endo reviewed 02/2011 - Abnormal TSH 08/2009 and 11/2009, mild - dx incidental labs- saw endo for same, US done 11/2009 and 02/2011 - s/p I-131 ablation 03/2011   Past Medical History  Diagnosis Date  . HYPERTENSION   . HYPERTHYROIDISM   . OBESITY   . ARTHRITIS   . Posttraumatic stress disorder   . INSOMNIA   . MIGRAINE HEADACHE   . Hyperthyroidism     s/p I-131 ablation 03/2011 of multinod goiter    Review of Systems  Constitutional: Positive for fatigue. Negative for fever and appetite change.  Respiratory: Negative for shortness of breath and wheezing.   Cardiovascular: Negative for chest pain and palpitations.  Neurological: Negative for headaches.       Objective:   Physical Exam BP 112/72  Pulse 74  Temp(Src) 98.2 F (36.8 C) (Oral)  Wt 164 lb 1.9 oz (74.444 kg)  BMI 28.16 kg/m2  SpO2 96% Wt Readings from Last 3 Encounters:  08/31/12 164 lb 1.9 oz (74.444 kg)  05/02/12 163 lb 6.4 oz (74.118 kg)  12/24/11 167 lb 1.9 oz (75.805 kg)   Constitutional: She is overweight, but appears well-developed and well-nourished. No distress. Neck: goiter nontender - no discrete mass -Normal range of motion. Neck supple. No JVD present. No thyromegaly present.   Cardiovascular: Normal rate, regular rhythm and normal heart sounds.  No murmur heard. No BLE edema. Pulmonary/Chest: Effort normal and breath sounds normal. No respiratory distress. She has no wheezes.  Abdomen: SNTND, +BS - no mass, no r/g MSkel: B knee - boggy synovitis, R>L - tender to palpation over joint line; FROM and ligamentous function intact. Mildly increased abnormal warmth over right side Psychiatric: She has a normal mood and affect. Her behavior is normal. Judgment and thought content normal.   Lab Results  Component Value Date   WBC 7.3 04/28/2012   HGB 13.3 04/28/2012   HCT 39.8 04/28/2012   PLT 351 04/28/2012   CHOL 127 07/22/2010   TRIG 48.0 07/22/2010   HDL 53.30 07/22/2010   ALT 7 04/28/2012   AST 12 04/28/2012   NA 140 04/28/2012   K 3.4* 04/28/2012   CL 100 04/28/2012   CREATININE 0.74 04/28/2012   BUN 18 04/28/2012   CO2 32 04/28/2012   TSH 1.14 09/15/2011        Assessment & Plan:   see problem list. Medications and labs reviewed today.

## 2012-08-31 NOTE — Assessment & Plan Note (Signed)
Abnormal mild suppressed TSH 08/2009 and 11/2009, 10/2010 S/p endo eval and [place] 11/2009 - unremarkable; then progression of goiter noted on 02/2011 follow up -  s/p I-131 ablation 03/2011 Last endo follow up 09/2011 reviewed Recheck labs and  ultrasound now, encourage endo follow up as indicated  Lab Results  Component Value Date   TSH 1.14 09/15/2011

## 2012-08-31 NOTE — Assessment & Plan Note (Signed)
BP Readings from Last 3 Encounters:  08/31/12 112/72  05/02/12 120/72  04/28/12 131/70   The current medical regimen is effective;  continue present plan and medications.

## 2012-08-31 NOTE — Patient Instructions (Signed)
It was good to see you today. We have reviewed your prior records including labs and tests today Test(s) ordered today. Your results will be released to MyChart (or called to you) after review, usually within 72hours after test completion. If any changes need to be made, you will be notified at that same time. we'll make referral for followup thyroid ultrasound of your neck. Our office will contact you regarding appointment(s) once made. Medications reviewed and updated, no changes recommended today. If lab work normal, we'll plan to resume Indocin 25 mg 3 times daily for arthritis pain symptoms Please schedule followup in 6 months for blood pressure check, call sooner if problems.

## 2012-08-31 NOTE — Addendum Note (Signed)
Addended by: Rene Paci A on: 08/31/2012 10:11 AM   Modules accepted: Orders

## 2012-09-01 LAB — BASIC METABOLIC PANEL
BUN: 16 mg/dL (ref 6–23)
CO2: 34 mEq/L — ABNORMAL HIGH (ref 19–32)
Calcium: 9.3 mg/dL (ref 8.4–10.5)
Chloride: 104 mEq/L (ref 96–112)
Creatinine, Ser: 0.5 mg/dL (ref 0.4–1.2)
GFR: 146.65 mL/min (ref >60.00)
Glucose, Bld: 82 mg/dL (ref 70–99)
Potassium: 3.7 mEq/L (ref 3.5–5.1)
Sodium: 139 mEq/L (ref 135–145)

## 2012-09-01 LAB — LIPID PANEL
Cholesterol: 133 mg/dL (ref 0–200)
HDL: 55.5 mg/dL (ref >39.00)
LDL Cholesterol: 72 mg/dL (ref 0–99)
Total CHOL/HDL Ratio: 2
Triglycerides: 29 mg/dL (ref 0.0–149.0)
VLDL: 5.8 mg/dL (ref 0.0–40.0)

## 2012-09-01 LAB — TSH: TSH: 0.67 u[IU]/mL (ref 0.35–5.50)

## 2012-09-01 LAB — T4, FREE: Free T4: 1.33 ng/dL (ref 0.60–1.60)

## 2012-09-02 ENCOUNTER — Ambulatory Visit
Admission: RE | Admit: 2012-09-02 | Discharge: 2012-09-02 | Disposition: A | Discharge status: Home / Self Care | Payer: Medicare Other | Source: Ambulatory Visit | Attending: Internal Medicine | Admitting: Internal Medicine

## 2012-09-02 DIAGNOSIS — E041 Nontoxic single thyroid nodule: Secondary | ICD-10-CM | POA: Diagnosis not present

## 2012-09-02 DIAGNOSIS — E0590 Thyrotoxicosis, unspecified without thyrotoxic crisis or storm: Secondary | ICD-10-CM

## 2012-09-07 DIAGNOSIS — M79609 Pain in unspecified limb: Secondary | ICD-10-CM | POA: Diagnosis not present

## 2012-09-07 DIAGNOSIS — L84 Corns and callosities: Secondary | ICD-10-CM | POA: Diagnosis not present

## 2012-09-07 DIAGNOSIS — B351 Tinea unguium: Secondary | ICD-10-CM | POA: Diagnosis not present

## 2012-09-08 ENCOUNTER — Telehealth: Payer: Self-pay | Admitting: *Deleted

## 2012-09-08 NOTE — Telephone Encounter (Signed)
Pt called requesting a Rx for arthritic knee pain as per your conversation at last OV.  Please advise

## 2012-09-09 MED ORDER — MELOXICAM 7.5 MG PO TABS: 7.5000 mg | ORAL_TABLET | Freq: Every day | ORAL | Status: DC

## 2012-09-09 NOTE — Telephone Encounter (Signed)
Spoke with pt advised of MDs order 

## 2012-09-09 NOTE — Telephone Encounter (Signed)
start meloxicam 7.5 mg once daily for arthritis pain -erx done

## 2012-09-28 ENCOUNTER — Other Ambulatory Visit: Payer: Self-pay | Admitting: Internal Medicine

## 2012-11-13 ENCOUNTER — Encounter (HOSPITAL_COMMUNITY): Payer: Self-pay | Admitting: Emergency Medicine

## 2012-11-13 ENCOUNTER — Emergency Department (HOSPITAL_COMMUNITY): Payer: Medicare Other

## 2012-11-13 ENCOUNTER — Emergency Department (HOSPITAL_COMMUNITY)
Admission: EM | Admit: 2012-11-13 | Discharge: 2012-11-13 | Disposition: A | Discharge status: Home / Self Care | Payer: Medicare Other | Attending: Emergency Medicine | Admitting: Emergency Medicine

## 2012-11-13 DIAGNOSIS — E669 Obesity, unspecified: Secondary | ICD-10-CM | POA: Insufficient documentation

## 2012-11-13 DIAGNOSIS — IMO0002 Reserved for concepts with insufficient information to code with codable children: Secondary | ICD-10-CM | POA: Diagnosis not present

## 2012-11-13 DIAGNOSIS — G43909 Migraine, unspecified, not intractable, without status migrainosus: Secondary | ICD-10-CM | POA: Diagnosis not present

## 2012-11-13 DIAGNOSIS — Z79899 Other long term (current) drug therapy: Secondary | ICD-10-CM | POA: Insufficient documentation

## 2012-11-13 DIAGNOSIS — R51 Headache: Secondary | ICD-10-CM | POA: Diagnosis not present

## 2012-11-13 DIAGNOSIS — Z8659 Personal history of other mental and behavioral disorders: Secondary | ICD-10-CM | POA: Diagnosis not present

## 2012-11-13 DIAGNOSIS — S0990XA Unspecified injury of head, initial encounter: Secondary | ICD-10-CM | POA: Diagnosis not present

## 2012-11-13 DIAGNOSIS — T07XXXA Unspecified multiple injuries, initial encounter: Secondary | ICD-10-CM | POA: Diagnosis not present

## 2012-11-13 DIAGNOSIS — S022XXA Fracture of nasal bones, initial encounter for closed fracture: Secondary | ICD-10-CM | POA: Insufficient documentation

## 2012-11-13 DIAGNOSIS — S0993XA Unspecified injury of face, initial encounter: Secondary | ICD-10-CM | POA: Diagnosis not present

## 2012-11-13 DIAGNOSIS — I10 Essential (primary) hypertension: Secondary | ICD-10-CM | POA: Diagnosis not present

## 2012-11-13 DIAGNOSIS — W010XXA Fall on same level from slipping, tripping and stumbling without subsequent striking against object, initial encounter: Secondary | ICD-10-CM | POA: Insufficient documentation

## 2012-11-13 DIAGNOSIS — S0181XA Laceration without foreign body of other part of head, initial encounter: Secondary | ICD-10-CM

## 2012-11-13 DIAGNOSIS — Z791 Long term (current) use of non-steroidal anti-inflammatories (NSAID): Secondary | ICD-10-CM | POA: Diagnosis not present

## 2012-11-13 DIAGNOSIS — S0120XA Unspecified open wound of nose, initial encounter: Secondary | ICD-10-CM | POA: Insufficient documentation

## 2012-11-13 DIAGNOSIS — S0081XA Abrasion of other part of head, initial encounter: Secondary | ICD-10-CM

## 2012-11-13 DIAGNOSIS — T1490XA Injury, unspecified, initial encounter: Secondary | ICD-10-CM | POA: Diagnosis not present

## 2012-11-13 DIAGNOSIS — M129 Arthropathy, unspecified: Secondary | ICD-10-CM | POA: Insufficient documentation

## 2012-11-13 DIAGNOSIS — Y9229 Other specified public building as the place of occurrence of the external cause: Secondary | ICD-10-CM | POA: Insufficient documentation

## 2012-11-13 DIAGNOSIS — W19XXXA Unspecified fall, initial encounter: Secondary | ICD-10-CM

## 2012-11-13 DIAGNOSIS — Y9301 Activity, walking, marching and hiking: Secondary | ICD-10-CM | POA: Insufficient documentation

## 2012-11-13 HISTORY — DX: Unspecified osteoarthritis, unspecified site: M19.90

## 2012-11-13 MED ORDER — OXYCODONE-ACETAMINOPHEN 5-325 MG PO TABS
1.0000 | ORAL_TABLET | Freq: Once | ORAL | Status: AC
  Administered 2012-11-13: 1 via ORAL
  Filled 2012-11-13: qty 1

## 2012-11-13 NOTE — ED Provider Notes (Signed)
CSN: [id number]     Arrival date & time 11/13/12  1113 History   First MD Initiated Contact with Patient 11/13/12 1121     Chief Complaint  Patient presents with  . Fall  . Facial Laceration   (Consider location/radiation/quality/duration/timing/severity/associated sxs/prior Treatment) HPI Comments: Patient presents after a fall which occurred approximately 60 minutes prior to arrival. Patient was walking [place] an aisle at [employer] and tripped on carpet. She fell onto the front of her face and sustained a laceration to the bridge of her nose and an abrasion of her upper lip. Fall was witnessed. Patient did not lose consciousness. She denies a prodrome including lightheadedness, dizziness, palpitations or chest pain. Patient was immediately assisted and pressure was held to her wounds which were bleeding. Patient currently complains of facial pain. No neck pain. She has exacerbation of her knee and hip arthritis. She has not tried to walk since the fall. No nausea or vomiting, blurry vision, weakness in her arms or legs. The onset of this condition was acute. The course is constant. Aggravating factors: none. Alleviating factors: none.    Patient is a 79 y.o. female presenting with fall. The history is provided by the patient and a relative.  Fall Pertinent negatives include no chest pain, fatigue, headaches, nausea, neck pain, numbness, vomiting or weakness.    Past Medical History  Diagnosis Date  . HYPERTENSION   . HYPERTHYROIDISM   . OBESITY   . ARTHRITIS   . Posttraumatic stress disorder   . INSOMNIA   . MIGRAINE HEADACHE   . Hyperthyroidism     s/p I-131 ablation 03/2011 of multinod goiter  . Arthritis    Past Surgical History  Procedure Laterality Date  . Abdominal hysterectomy  1976  . Total hip arthroplasty  1998     right  . Tonsillectomy    . Breast surgery      biopsy   Family History  Problem Relation Age of Onset  . Asthma Mother   . Asthma Father   . Prostate  cancer Father    History  Substance Use Topics  . Smoking status: Never Smoker   . Smokeless tobacco: Not on file  . Alcohol Use: No   OB History   Grav Para Term Preterm Abortions TAB SAB Ect Mult Living                 Review of Systems  Constitutional: Negative for fatigue.  HENT: Positive for facial swelling. Negative for tinnitus.   Eyes: Negative for photophobia, pain and visual disturbance.  Respiratory: Negative for shortness of breath.   Cardiovascular: Negative for chest pain.  Gastrointestinal: Negative for nausea and vomiting.  Musculoskeletal: Negative for back pain, gait problem and neck pain.  Skin: Positive for wound.  Neurological: Negative for dizziness, weakness, light-headedness, numbness and headaches.  Psychiatric/Behavioral: Negative for confusion and decreased concentration.    Allergies  Review of patient's allergies indicates no known allergies.  Home Medications   Current Outpatient Rx  Name  Route  Sig  Dispense  Refill  . hydrochlorothiazide (HYDRODIURIL) 25 MG tablet   Oral   Take 25 mg by mouth daily.         . meloxicam (MOBIC) 7.5 MG tablet   Oral   Take 1 tablet (7.5 mg total) by mouth daily.   30 tablet   2   . oxyCODONE-acetaminophen (PERCOCET) 7.5-325 MG per tablet      Take 1 by mouth three times a day  as needed         . Probiotic Product (ALIGN) 4 MG CAPS   Oral   Take by mouth daily.           . propranolol (INDERAL) 20 MG tablet   Oral   Take 20 mg by mouth 2 (two) times daily.         . traZODone (DESYREL) 50 MG tablet   Oral   Take 1 tablet (50 mg total) by mouth at bedtime as needed for sleep.   30 tablet   1    BP 130/72  Pulse 68  Temp(Src) 97.7 F (36.5 C) (Oral)  Resp 18  SpO2 99% Physical Exam  Nursing note and vitals reviewed. Constitutional: She is oriented to person, place, and time. She appears well-developed and well-nourished.  HENT:  Head: Normocephalic and atraumatic. Head is  without raccoon's eyes and without Battle's sign.  Right Ear: Tympanic membrane, external ear and ear canal normal. No hemotympanum.  Left Ear: Tympanic membrane, external ear and ear canal normal. No hemotympanum.  Nose: Nose normal. No nasal septal hematoma.  Mouth/Throat: Uvula is midline, oropharynx is clear and moist and mucous membranes are normal.  Abrasion inferior to left nare. Right epistaxis controlled. 1cm lac bridge of nose without deformity. Bridge of nose is tender. Upper lip is moderately swollen. Tenderness above L eyebrow without deformity. Pt with partial in place that is not broken. She has small cut inside upper lip.   Eyes: Conjunctivae, EOM and lids are normal. Pupils are equal, round, and reactive to light. Right eye exhibits no nystagmus. Left eye exhibits no nystagmus.  No visible hyphema noted  Neck: Normal range of motion. Neck supple.  Cardiovascular: Normal rate and regular rhythm.   Pulmonary/Chest: Effort normal and breath sounds normal.  Abdominal: Soft. There is no tenderness. There is no rebound and no guarding.  Musculoskeletal:       Right hip: She exhibits tenderness. She exhibits normal range of motion and normal strength.       Left hip: Normal.       Right knee: Normal.       Left knee: She exhibits normal range of motion and no swelling. Tenderness found.       Cervical back: She exhibits normal range of motion, no tenderness and no bony tenderness.       Thoracic back: She exhibits no tenderness and no bony tenderness.       Lumbar back: Normal. She exhibits no tenderness and no bony tenderness.  Neurological: She is alert and oriented to person, place, and time. She has normal strength and normal reflexes. No cranial nerve deficit or sensory deficit. She displays a negative Romberg sign. Coordination and gait normal. GCS eye subscore is 4. GCS verbal subscore is 5. GCS motor subscore is 6.  Skin: Skin is warm and dry.  Psychiatric: She has a normal  mood and affect.    ED Course  Procedures (including critical care time) Labs Review Labs Reviewed - No data to display Imaging Review Ct Head Wo Contrast  11/13/2012   CLINICAL DATA:  Fall with head and facial injuries.  EXAM: CT HEAD WITHOUT CONTRAST  CT MAXILLOFACIAL WITHOUT CONTRAST  CT CERVICAL SPINE WITHOUT CONTRAST  TECHNIQUE: Multidetector CT imaging of the head, cervical spine, and maxillofacial structures were performed using the standard protocol without intravenous contrast. Multiplanar CT image reconstructions of the cervical spine and maxillofacial structures were also generated.  COMPARISON:  None.  FINDINGS: CT HEAD FINDINGS  The brain demonstrates no evidence of hemorrhage, infarction, edema, mass effect, extra-axial fluid collection, hydrocephalus or mass lesion. The skull is unremarkable.  CT MAXILLOFACIAL FINDINGS  At the tip of the nasal bones, there likely is a nondisplaced fracture. The nasal septum is in the midline. No other acute fracture is identified. Mild mucosal thickening is present in both maxillary antra. No incidental masses are seen. The visualized airway is normally patent.  CT CERVICAL SPINE FINDINGS  The cervical spine demonstrates no evidence of fracture or subluxation. Spondylosis present at C5-6 and C6-7. No bony lesions are identified.  Incidental note is made of a massive thyroid goiter extending into the mediastinum and visibly displacing the trachea to the right. The left lobe of the thyroid gland does appear to be more significantly enlarged than the right lobe.  IMPRESSION: 1. Nondisplaced fracture at the tip of the nasal bones. 2. Massive thyroid goiter extending into the mediastinum and displacing the trachea to the right.   Electronically Signed   By: Irish Lack M.D.   On: 11/13/2012 13:28   Ct Cervical Spine Wo Contrast  11/13/2012   CLINICAL DATA:  Fall with head and facial injuries.  EXAM: CT HEAD WITHOUT CONTRAST  CT MAXILLOFACIAL WITHOUT  CONTRAST  CT CERVICAL SPINE WITHOUT CONTRAST  TECHNIQUE: Multidetector CT imaging of the head, cervical spine, and maxillofacial structures were performed using the standard protocol without intravenous contrast. Multiplanar CT image reconstructions of the cervical spine and maxillofacial structures were also generated.  COMPARISON:  None.  FINDINGS: CT HEAD FINDINGS  The brain demonstrates no evidence of hemorrhage, infarction, edema, mass effect, extra-axial fluid collection, hydrocephalus or mass lesion. The skull is unremarkable.  CT MAXILLOFACIAL FINDINGS  At the tip of the nasal bones, there likely is a nondisplaced fracture. The nasal septum is in the midline. No other acute fracture is identified. Mild mucosal thickening is present in both maxillary antra. No incidental masses are seen. The visualized airway is normally patent.  CT CERVICAL SPINE FINDINGS  The cervical spine demonstrates no evidence of fracture or subluxation. Spondylosis present at C5-6 and C6-7. No bony lesions are identified.  Incidental note is made of a massive thyroid goiter extending into the mediastinum and visibly displacing the trachea to the right. The left lobe of the thyroid gland does appear to be more significantly enlarged than the right lobe.  IMPRESSION: 1. Nondisplaced fracture at the tip of the nasal bones. 2. Massive thyroid goiter extending into the mediastinum and displacing the trachea to the right.   Electronically Signed   By: Irish Lack M.D.   On: 11/13/2012 13:28   Ct Maxillofacial Wo Cm  11/13/2012   CLINICAL DATA:  Fall with head and facial injuries.  EXAM: CT HEAD WITHOUT CONTRAST  CT MAXILLOFACIAL WITHOUT CONTRAST  CT CERVICAL SPINE WITHOUT CONTRAST  TECHNIQUE: Multidetector CT imaging of the head, cervical spine, and maxillofacial structures were performed using the standard protocol without intravenous contrast. Multiplanar CT image reconstructions of the cervical spine and maxillofacial structures  were also generated.  COMPARISON:  None.  FINDINGS: CT HEAD FINDINGS  The brain demonstrates no evidence of hemorrhage, infarction, edema, mass effect, extra-axial fluid collection, hydrocephalus or mass lesion. The skull is unremarkable.  CT MAXILLOFACIAL FINDINGS  At the tip of the nasal bones, there likely is a nondisplaced fracture. The nasal septum is in the midline. No other acute fracture is identified. Mild mucosal thickening is present in both maxillary antra.  No incidental masses are seen. The visualized airway is normally patent.  CT CERVICAL SPINE FINDINGS  The cervical spine demonstrates no evidence of fracture or subluxation. Spondylosis present at C5-6 and C6-7. No bony lesions are identified.  Incidental note is made of a massive thyroid goiter extending into the mediastinum and visibly displacing the trachea to the right. The left lobe of the thyroid gland does appear to be more significantly enlarged than the right lobe.  IMPRESSION: 1. Nondisplaced fracture at the tip of the nasal bones. 2. Massive thyroid goiter extending into the mediastinum and displacing the trachea to the right.   Electronically Signed   By: Irish Lack M.D.   On: 11/13/2012 13:28    EKG Interpretation   None      11:58 AM Patient seen and examined. Work-up initiated.    Vital signs reviewed and are as follows: Filed Vitals:   11/13/12 1128  BP: 130/72  Pulse: 68  Temp: 97.7 F (36.5 C)  Resp: 18   2:23 PM Pt and family informed of results.   Dermabond applied to small lac on bridge on nose. Counseled on wound care.   Pt d/w and seen by Dr. Freida Busman.   Pt takes Percocet at home. Dose ordered here.   Patient was counseled on head injury precautions and symptoms that should indicate their return to the ED.  These include severe worsening headache, vision changes, confusion, loss of consciousness, trouble walking, nausea & vomiting, or weakness/tingling in extremities.      MDM   1. Facial  laceration, initial encounter   2. Nasal bone fracture, closed, initial encounter   3. Facial abrasion, initial encounter   4. Fall, initial encounter    Fall, mechanical, no prodrome. Imaging per above. Multinodular goiter addressed previously. Pt has pain medication at home.     Renne Crigler, PA-C 11/13/12 1429

## 2012-11-13 NOTE — ED Notes (Signed)
Pt. Ambulated with no assist. Pt. Walked to the bathroom with no assist and denies feeling dizzy and light headed. Nurse was notified.

## 2012-11-13 NOTE — ED Notes (Signed)
Per EMS [place] her [place] hung on carpet and pt fell hitting her face causing laceration on right side of nose and abrasion under left nare.  Pt c/o pain in her nose and lip areas.

## 2012-11-13 NOTE — ED Provider Notes (Signed)
Medical screening examination/treatment/procedure(s) were conducted as a shared visit with non-physician practitioner(s) and myself.  I personally evaluated the patient during the encounter.  EKG Interpretation   None      Pt seen and examined and x-rays reviewed--pt with mechanical fall with non-displaced nasal bone fx--no focal defificts--stable for d/c  Toy Baker, MD 11/13/12 1412

## 2012-11-13 NOTE — ED Provider Notes (Signed)
Medical screening examination/treatment/procedure(s) were conducted as a shared visit with non-physician practitioner(s) and myself.  I personally evaluated the patient during the encounter.  EKG Interpretation   None        Toy Baker, MD 11/13/12 1433

## 2012-11-13 NOTE — ED Notes (Signed)
Pt. Had bacitracin applied to the top of her lip and nose. Nurse was notified.

## 2012-11-28 ENCOUNTER — Other Ambulatory Visit: Payer: Self-pay | Admitting: Internal Medicine

## 2012-12-22 ENCOUNTER — Ambulatory Visit (INDEPENDENT_AMBULATORY_CARE_PROVIDER_SITE_OTHER): Payer: Medicare Other | Admitting: Internal Medicine

## 2012-12-22 ENCOUNTER — Encounter: Payer: Self-pay | Admitting: Internal Medicine

## 2012-12-22 VITALS — BP 112/80 | HR 70 | Temp 97.5°F

## 2012-12-22 DIAGNOSIS — I10 Essential (primary) hypertension: Secondary | ICD-10-CM

## 2012-12-22 DIAGNOSIS — J069 Acute upper respiratory infection, unspecified: Secondary | ICD-10-CM

## 2012-12-22 MED ORDER — HYDROCODONE-HOMATROPINE 5-1.5 MG/5ML PO SYRP: 5.0000 mL | ORAL_SOLUTION | Freq: Four times a day (QID) | ORAL | Status: DC | PRN

## 2012-12-22 MED ORDER — AZITHROMYCIN 250 MG PO TABS: ORAL_TABLET | ORAL | Status: DC

## 2012-12-22 MED ORDER — MELOXICAM 7.5 MG PO TABS: 7.5000 mg | ORAL_TABLET | Freq: Every day | ORAL | Status: DC

## 2012-12-22 NOTE — Progress Notes (Signed)
   Subjective:    Patient ID: Sandy Barrett, female    DOB: 1932/03/21, 79 y.o.   MRN: [id number]  HPI   Here with 2-3 days acute onset fever, facial pain, pressure, headache, general weakness and malaise, and greenish d/c, with mild ST and cough, but pt denies chest pain, wheezing, increased sob or doe, orthopnea, PND, increased LE swelling, palpitations, dizziness or syncope.  Past Medical History  Diagnosis Date  . HYPERTENSION   . HYPERTHYROIDISM   . OBESITY   . ARTHRITIS   . Posttraumatic stress disorder   . INSOMNIA   . MIGRAINE HEADACHE   . Hyperthyroidism     s/p I-131 ablation 03/2011 of multinod goiter  . Arthritis    Past Surgical History  Procedure Laterality Date  . Abdominal hysterectomy  1976  . Total hip arthroplasty  1998     right  . Tonsillectomy    . Breast surgery      biopsy    reports that she has never smoked. She does not have any smokeless tobacco history on file. She reports that she does not drink alcohol or use illicit drugs. family history includes Asthma in her father and mother; Prostate cancer in her father. No Known Allergies Current Outpatient Prescriptions on File Prior to Visit  Medication Sig Dispense Refill  . hydrochlorothiazide (HYDRODIURIL) 25 MG tablet Take 25 mg by mouth daily.      . hydrochlorothiazide (HYDRODIURIL) 25 MG tablet TAKE 1 TABLET BY MOUTH EVERY DAY  30 tablet  5  . oxyCODONE-acetaminophen (PERCOCET) 7.5-325 MG per tablet Take 1 by mouth three times a day as needed      . Probiotic Product (ALIGN) 4 MG CAPS Take by mouth daily.        . propranolol (INDERAL) 20 MG tablet Take 20 mg by mouth 2 (two) times daily.      . traZODone (DESYREL) 50 MG tablet Take 1 tablet (50 mg total) by mouth at bedtime as needed for sleep.  30 tablet  1   No current facility-administered medications on file prior to visit.      Review of Systems All otherwise neg per pt     Objective:   Physical Exam  BP 112/80  Pulse 70   Temp(Src) 97.5 F (36.4 C) (Oral)  SpO2 93% VS noted, .mild ill Constitutional: Pt appears well-developed and well-nourished.  HENT: Head: NCAT.  Right Ear: External ear normal.  Left Ear: External ear normal.  Eyes: Conjunctivae and EOM are normal. Pupils are equal, round, and reactive to light.  Bilat tm's with mild erythema.  Max sinus areas non tender.  Pharynx with mild erythema, no exudate Neck: Normal range of motion. Neck supple.  Cardiovascular: Normal rate and regular rhythm.   Pulmonary/Chest: Effort normal and breath sounds normal.  Neurological: Pt is alert. Not confused  Skin: Skin is warm. No erythema.  Psychiatric: Pt behavior is normal. Thought content normal.       Assessment & Plan:

## 2012-12-22 NOTE — Progress Notes (Signed)
Pre-visit discussion using our clinic review tool. No additional management support is needed unless otherwise documented below in the visit note.  

## 2012-12-22 NOTE — Patient Instructions (Signed)
Please take all new medication as prescribed Please continue all other medications as before, and refills have been done if requested. Please have the pharmacy call with any other refills you may need.  You can also take Mucinex (or it's generic off brand) for congestion, and tylenol as needed for pain.  

## 2012-12-25 NOTE — Assessment & Plan Note (Signed)
Mild to mod, for antibx course,  to f/u any worsening symptoms or concerns 

## 2012-12-25 NOTE — Assessment & Plan Note (Signed)
stable overall by history and exam, recent data reviewed with pt, and pt to continue medical treatment as before,  to f/u any worsening symptoms or concerns BP Readings from Last 3 Encounters:  12/22/12 112/80  11/13/12 130/72  08/31/12 112/72

## 2013-02-06 ENCOUNTER — Encounter: Payer: Self-pay | Admitting: Internal Medicine

## 2013-02-06 ENCOUNTER — Ambulatory Visit (INDEPENDENT_AMBULATORY_CARE_PROVIDER_SITE_OTHER): Payer: Medicare Other | Admitting: Internal Medicine

## 2013-02-06 VITALS — BP 120/74 | HR 83 | Temp 98.4°F | Wt 167.0 lb

## 2013-02-06 DIAGNOSIS — M1990 Unspecified osteoarthritis, unspecified site: Secondary | ICD-10-CM | POA: Diagnosis not present

## 2013-02-06 DIAGNOSIS — S0083XA Contusion of other part of head, initial encounter: Secondary | ICD-10-CM

## 2013-02-06 DIAGNOSIS — S1093XA Contusion of unspecified part of neck, initial encounter: Secondary | ICD-10-CM | POA: Diagnosis not present

## 2013-02-06 DIAGNOSIS — E0590 Thyrotoxicosis, unspecified without thyrotoxic crisis or storm: Secondary | ICD-10-CM

## 2013-02-06 DIAGNOSIS — S0003XA Contusion of scalp, initial encounter: Secondary | ICD-10-CM

## 2013-02-06 DIAGNOSIS — I10 Essential (primary) hypertension: Secondary | ICD-10-CM | POA: Diagnosis not present

## 2013-02-06 MED ORDER — MELOXICAM 7.5 MG PO TABS: 7.5000 mg | ORAL_TABLET | Freq: Every day | ORAL | Status: DC

## 2013-02-06 MED ORDER — OXYCODONE-ACETAMINOPHEN 7.5-325 MG PO TABS: 1.0000 | ORAL_TABLET | Freq: Three times a day (TID) | ORAL | Status: DC | PRN

## 2013-02-06 NOTE — Progress Notes (Signed)
Pre-visit discussion using our clinic review tool. No additional management support is needed unless otherwise documented below in the visit note.  

## 2013-02-06 NOTE — Assessment & Plan Note (Signed)
Multiple locations: R shoulder - degenerative tear RTC on MRI - Increasing knee symptoms, R>L: boggy synovitis but no effusion on exam - reports steroid injection on left may 2014 with minimal relief [facility] followed [facility] and [facility] for same - wishes to avoid surgery at all costs As [facility] leaving [place], will change to sports med here [facility]) Previously on NSAIDs, changed to narc TID and tylenol only resume NSAID low dose 08/2012 - monitor Cr stable, no edema and blood pressure controlled - reassurance provided Refill on Percocet today pending est with new sport med

## 2013-02-06 NOTE — Patient Instructions (Signed)
It was good to see you today.  We have reviewed your prior records including labs and tests today  Medications reviewed and updated, no changes recommended today. We will fill your Percocet here until you get to a new sport medicine doctor   We will schedule you to see Dr. Tamala Julian in the next 4-6 weeks to establish care for your knees and shoulder; no surgery!  Please schedule followup in 6 months for blood pressure check, call sooner if problems.

## 2013-02-06 NOTE — Assessment & Plan Note (Signed)
Abnormal mild suppressed TSH 08/2009 and 11/2009, 10/2010 S/p endo eval fall 2011 [place] 11/2009 - unremarkable; then progression of goiter noted on 02/2011, stable [place] 09/2012 -  s/p I-131 ablation 03/2011 Last endo follow up 09/2011 reviewed  Lab Results  Component Value Date   TSH 0.67 08/31/2012

## 2013-02-06 NOTE — Progress Notes (Signed)
   Subjective:    Patient ID: Sandy Barrett, female    DOB: [birth year], 80 y.o.   MRN: [id number]  Facial Injury  Pertinent negatives include no headaches.    Patient here today for follow up and review of chronic medical issues -  s/p mechanical fall 12/2012 - was evaluated in the ER at that time with no fracture noted.  Still with bruising on bridge of nose.  No swelling, redness, or trouble breathing.  Arthritis - previously followed by Dr Junius Roads (he has since moved to [facility]).  Pain controlled with Mobic and Percocet.  Patient requesting refills today and referral to new MD  HTN - well controlled on current therapy.  Pt denies CV symptoms.  Reports compliance with current medical therapy.  No adverse events noted.   Past Medical History  Diagnosis Date  . HYPERTENSION   . HYPERTHYROIDISM   . OBESITY   . ARTHRITIS   . Posttraumatic stress disorder   . INSOMNIA   . MIGRAINE HEADACHE   . Hyperthyroidism     s/p I-131 ablation 03/2011 of multinod goiter  . Arthritis      Review of Systems  Constitutional: Negative for fever, chills, activity change and appetite change.  HENT: Negative for congestion, nosebleeds, postnasal drip and sinus pressure.   Respiratory: Negative for chest tightness, shortness of breath and wheezing.   Cardiovascular: Negative for chest pain, palpitations and leg swelling.  Musculoskeletal: Positive for arthralgias (right knee - chronic).  Neurological: Negative for dizziness, syncope, light-headedness and headaches.       Objective:   Physical Exam  Vitals reviewed. Constitutional: She is oriented to person, place, and time. She appears well-developed and well-nourished. No distress.  HENT:  Head: Normocephalic.  Small contusion on bridge of nose - no erythema, or edema noted.   Neck: Normal range of motion. Neck supple.  Cardiovascular: Normal rate, regular rhythm and normal heart sounds.   No murmur heard. Pulmonary/Chest: Effort normal and  breath sounds normal. No respiratory distress. She has no wheezes. She exhibits no tenderness.  Neurological: She is alert and oriented to person, place, and time.  Skin: Skin is warm and dry. No rash noted. She is not diaphoretic.  Psychiatric: She has a normal mood and affect. Her behavior is normal. Judgment and thought content normal.    Wt Readings from Last 3 Encounters:  02/06/13 167 lb (75.751 kg)  08/31/12 164 lb 1.9 oz (74.444 kg)  05/02/12 163 lb 6.4 oz (74.118 kg)   BP Readings from Last 3 Encounters:  02/06/13 120/74  12/22/12 112/80  11/13/12 130/72   Lab Results  Component Value Date   WBC 7.3 04/28/2012   HGB 13.3 04/28/2012   HCT 39.8 04/28/2012   PLT 351 04/28/2012   GLUCOSE 82 08/31/2012   CHOL 133 08/31/2012   TRIG 29.0 08/31/2012   HDL 55.50 08/31/2012   LDLCALC 72 08/31/2012   ALT 7 04/28/2012   AST 12 04/28/2012   NA 139 08/31/2012   K 3.7 08/31/2012   CL 104 08/31/2012   CREATININE 0.5 08/31/2012   BUN 16 08/31/2012   CO2 34* 08/31/2012   TSH 0.67 08/31/2012         Assessment & Plan:   1.  Facial contusion - evaluated in ER after mechanical fall 12/2012 - no fracture noted on x-ray at that time.  Reassurance provided.   Also See problem list. Medications and labs reviewed today.

## 2013-02-06 NOTE — Assessment & Plan Note (Signed)
BP Readings from Last 3 Encounters:  02/06/13 120/74  12/22/12 112/80  11/13/12 130/72   The current medical regimen is effective;  continue present plan and medications.

## 2013-02-07 ENCOUNTER — Telehealth: Payer: Self-pay | Admitting: Internal Medicine

## 2013-02-07 NOTE — Telephone Encounter (Signed)
Relevant patient education mailed to patient.  

## 2013-02-24 ENCOUNTER — Other Ambulatory Visit: Payer: Self-pay | Admitting: Internal Medicine

## 2013-03-01 NOTE — Telephone Encounter (Signed)
Faxed script back to [facility].../lmb 

## 2013-03-06 ENCOUNTER — Ambulatory Visit (INDEPENDENT_AMBULATORY_CARE_PROVIDER_SITE_OTHER): Payer: Medicare Other | Admitting: Family Medicine

## 2013-03-06 ENCOUNTER — Other Ambulatory Visit: Payer: Self-pay | Admitting: Family Medicine

## 2013-03-06 ENCOUNTER — Encounter: Payer: Self-pay | Admitting: Family Medicine

## 2013-03-06 ENCOUNTER — Ambulatory Visit (INDEPENDENT_AMBULATORY_CARE_PROVIDER_SITE_OTHER)
Admission: RE | Admit: 2013-03-06 | Discharge: 2013-03-06 | Disposition: A | Discharge status: Home / Self Care | Payer: Medicare Other | Source: Ambulatory Visit | Attending: Family Medicine | Admitting: Family Medicine

## 2013-03-06 VITALS — BP 128/72 | HR 78 | Temp 97.7°F | Resp 16 | Wt 167.1 lb

## 2013-03-06 DIAGNOSIS — M1710 Unilateral primary osteoarthritis, unspecified knee: Secondary | ICD-10-CM

## 2013-03-06 DIAGNOSIS — IMO0002 Reserved for concepts with insufficient information to code with codable children: Secondary | ICD-10-CM | POA: Diagnosis not present

## 2013-03-06 NOTE — Assessment & Plan Note (Signed)
Patient does have severe osteoarthritic changes of the knees bilaterally. The like to get an x-ray to rule out as well as set to baseline of arthritic changes. Injection given today. Discussed icing protocol Given home exercise program Patient was also told about over-the-counter medications he can be beneficial. Patient does get pain medications from primary care provider. Discussed proper shoe wear and orthotics they can be beneficial. Patient will try these interventions and come back in 3-4 weeks. At that time if she continues to have trouble when he would consider Visco supplementation.

## 2013-03-06 NOTE — Patient Instructions (Addendum)
Very nice to meet you We did an injection today in both knees Bring in your brace or compression sleeve at next visit.  Would like you to get xrays downstairs today to give [place] a new baseline on amount of arthritis.  Other medicines that can be helpful.   Try workout I am giving you 3 itmes a week or go to gym.  Glucosamine sulfate 750mg  twice a day is a supplement that has been shown to help moderate to severe arthritis. Vitamin D 2000 IU daily Fish oil 2 grams daily.  Tumeric 500mg  twice daily.  Capsaicin topically up to four times a day may also help with pain. Cortisone injections are an option if these interventions do not seem to make a difference or need more relief.  If cortisone injections do not help, there are different types of shots that may help but they take longer to take effect.  We can discuss this at follow up.  It's important that you continue to stay active. Controlling your weight is important.  Consider physical therapy to strengthen muscles around the joint that hurts to take pressure off of the joint itself. Shoe inserts with good arch support may be helpful.  [employer] orthotics at [employer] [facility] could help.  Water aerobics and cycling with low resistance are the best two types of exercise for arthritis. Come back and see me in 3 weeks.

## 2013-03-06 NOTE — Progress Notes (Signed)
[name] [address] [place], [place] [address] Phone: [phone] Subjective:    I'm seeing this patient by the request  of:  Gwendolyn Grant, MD   CC: knee pain  [id number] Sandy Barrett is a 80 y.o. female coming in with complaint of  Knee pain. Patient does have a past medical history significant for significant osteoarthritis in multiple different joints. Patient was seen in the provider who unfortunately moved out of town. Patient states the right knee is worse than left knee. The can obtain both sides. Patient did have an injection back in May of 2014 that did give her some mild relief. Patient states the pain is worse with significant walking. Patient would like to avoid any type of surgery at all costs. Patient rates the severity of 8/10 on a regular daily basis and can wake her up at night.    Past medical history, social, surgical and family history all reviewed in electronic medical record.   Review of Systems: No headache, visual changes, nausea, vomiting, diarrhea, constipation, dizziness, abdominal pain, skin rash, fevers, chills, night sweats, weight loss, swollen lymph nodes, body aches, joint swelling, muscle aches, chest pain, shortness of breath, mood changes.   Objective Blood pressure 128/72, pulse 78, temperature 97.7 F (36.5 C), temperature source Oral, resp. rate 16, weight 167 lb 1.9 oz (75.805 kg), SpO2 96.00%.  General: No apparent distress alert and oriented x3 mood and affect normal, dressed appropriately.  HEENT: Pupils equal, extraocular movements intact  Respiratory: Patient's speak in full sentences and does not appear short of breath  Cardiovascular: No lower extremity edema, non tender, no erythema  Skin: Warm dry intact with no signs of infection or rash on extremities or on axial skeleton.  Abdomen: Soft nontender  Neuro: Cranial nerves II through XII are intact, neurovascularly intact in all extremities  with 2+ DTRs and 2+ pulses.  Lymph: No lymphadenopathy of posterior or anterior cervical chain or axillae bilaterally.  Gait normal with good balance and coordination.  MSK:  Non tender with full range of motion and good stability and symmetric strength and tone of shoulders, elbows, wrist, hip (does have replacement on right side), and ankles bilaterally.  Knee: Bilateral Normal to inspection but does have significant osteoarthritic changes. Palpation show severe tenderness to palpation over the medial joint line bilaterally. ROM full in flexion and extension and lower leg rotation. Ligaments with solid consistent endpoints including ACL, PCL, LCL, MCL. Negative Mcmurray's, Apley's, and Thessalonian tests.  painful patellar compression. Patellar glide with moderate crepitus. Patellar and quadriceps tendons unremarkable. Hamstring and quadriceps strength is normal.   Procedure: Real-time Ultrasound Guided Injection of right and left knee Device: GE Logiq E  Ultrasound guided injection is preferred based studies that show increased duration, increased effect, greater accuracy, decreased procedural pain, increased response rate, and decreased cost with ultrasound guided versus blind injection.  Verbal informed consent obtained.  Time-out conducted.  Noted no overlying erythema, induration, or other signs of local infection.  Skin prepped in a sterile fashion.  Local anesthesia: Topical Ethyl chloride.  With sterile technique and under real time ultrasound guidance:  Anterior lateral approach was done on both knees with a 27-gauge 1-1/2 inch needle with 4 cc of 0.5% Marcaine and 1 cc of Kenalog 40 mg/dL in each knee separately. Completed without difficulty  Pain immediately resolved suggesting accurate placement of the medication.  Advised to call if fevers/chills, erythema, induration, drainage, or persistent bleeding.  Images permanently  stored and available for review in the ultrasound  unit.  Impression: Technically successful ultrasound guided injections.    Impression and Recommendations:     This case required medical decision making of moderate complexity.

## 2013-03-06 NOTE — Progress Notes (Signed)
Pre visit review using our clinic review tool, if applicable. No additional management support is needed unless otherwise documented below in the visit note. 

## 2013-03-18 DIAGNOSIS — H2510 Age-related nuclear cataract, unspecified eye: Secondary | ICD-10-CM | POA: Diagnosis not present

## 2013-03-27 ENCOUNTER — Ambulatory Visit (INDEPENDENT_AMBULATORY_CARE_PROVIDER_SITE_OTHER): Payer: Medicare Other | Admitting: Family Medicine

## 2013-03-27 ENCOUNTER — Encounter: Payer: Self-pay | Admitting: Family Medicine

## 2013-03-27 VITALS — BP 134/86 | HR 87

## 2013-03-27 DIAGNOSIS — M1710 Unilateral primary osteoarthritis, unspecified knee: Secondary | ICD-10-CM

## 2013-03-27 NOTE — Assessment & Plan Note (Addendum)
Patient is improving slowly. Encourage her to continue exercises at least 3 times a week. We discussed Tylenol and other over-the-counter medications he to be beneficial. We discussed not mixing this with some of her other prescription medications more specifically the Percocet. Discussed icing protocol that could be beneficial. Patient and will come back again in 2 months. We'll evaluate her further and continued steroid injections every 3-4 months if necessary. If patient's pain comes back sooner than that duration we can consider Visco supplementation. Spent greater than 25 minutes with patient face-to-face and had greater than 50% of counseling including as described above in assessment and plan.

## 2013-03-27 NOTE — Patient Instructions (Signed)
You have made my day Continue the cream and the vitamin D Start tylenol 650 mg three times a day.  If having severe pain then take the percocet but don't take the tylenol that day then.  Ice after walking or exercises for 15 minutes can help Come back and see me again in 2 months and if any more pain we can do another injection.

## 2013-03-27 NOTE — Progress Notes (Signed)
  [facility] [place] [place], [place] [address] Phone: [phone] Subjective:      CC: knee pain follow up bilaterally.   [id number] Sandy Barrett is a 80 y.o. female coming in with complaint of  Knee pain. Patient does have severe lateral compartment degenerative changes of the knees bilaterally. Patient had injections at last visit and states since that time she is approximately 60-70% better. Patient still has dull aching pain when it is cold out and if she walks long distances otherwise she has days where she is completely pain-free. Patient has been taking vitamin D which he thinks may be helpful. Has not tried any other modalities including icing.    Past medical history, social, surgical and family history all reviewed in electronic medical record.   Review of Systems: No headache, visual changes, nausea, vomiting, diarrhea, constipation, dizziness, abdominal pain, skin rash, fevers, chills, night sweats, weight loss, swollen lymph nodes, body aches, joint swelling, muscle aches, chest pain, shortness of breath, mood changes.   Objective Blood pressure 134/86, pulse 87, SpO2 97.00%.  General: No apparent distress alert and oriented x3 mood and affect normal, dressed appropriately.  HEENT: Pupils equal, extraocular movements intact  Respiratory: Patient's speak in full sentences and does not appear short of breath  Cardiovascular: No lower extremity edema, non tender, no erythema  Skin: Warm dry intact with no signs of infection or rash on extremities or on axial skeleton.  Abdomen: Soft nontender  Neuro: Cranial nerves II through XII are intact, neurovascularly intact in all extremities with 2+ DTRs and 2+ pulses.  Lymph: No lymphadenopathy of posterior or anterior cervical chain or axillae bilaterally.  Gait normal with good balance and coordination.  MSK:  Non tender with full range of motion and good stability and symmetric strength  and tone of shoulders, elbows, wrist, hip (does have replacement on right side), and ankles bilaterally.  Knee: Bilateral Normal to inspection but does have significant osteoarthritic changes. Palpation show severe tenderness to palpation over the medial joint line bilaterally. ROM full in flexion and extension and lower leg rotation. Ligaments with solid consistent endpoints including ACL, PCL, LCL, MCL. Negative Mcmurray's, Apley's, and Thessalonian tests.  painful patellar compression but improved from previous exam. Patellar glide with moderate crepitus. Patellar and quadriceps tendons unremarkable. Hamstring and quadriceps strength is normal.     Impression and Recommendations:     This case required medical decision making of moderate complexity.

## 2013-04-01 ENCOUNTER — Other Ambulatory Visit: Payer: Self-pay | Admitting: Internal Medicine

## 2013-05-21 ENCOUNTER — Other Ambulatory Visit: Payer: Self-pay | Admitting: Internal Medicine

## 2013-05-30 ENCOUNTER — Ambulatory Visit (INDEPENDENT_AMBULATORY_CARE_PROVIDER_SITE_OTHER): Payer: Medicare Other | Admitting: Family Medicine

## 2013-05-30 ENCOUNTER — Encounter: Payer: Self-pay | Admitting: Family Medicine

## 2013-05-30 VITALS — BP 118/80 | HR 75 | Wt 169.0 lb

## 2013-05-30 DIAGNOSIS — M1710 Unilateral primary osteoarthritis, unspecified knee: Secondary | ICD-10-CM

## 2013-05-30 NOTE — Progress Notes (Signed)
  [facility] [place] [place], [place] [address] Phone: [phone] Subjective:      CC: knee pain follow up bilaterally.   [id number] Sandy Barrett is a 80 y.o. female coming in with complaint of  Knee pain. Patient does have severe lateral compartment degenerative changes of the knees bilaterally. Patient had injections 3 months ago. Patient states that she had been doing very well for the last 2 weeks she started having more difficulty. Patient states it all seems to be on the right knee. Denies any pain with the left knee. Patient states that she's been doing the topical anti-inflammatory as well as ice he which does seem to be helpful. Patient still able to do activities of daily living without any significant discomfort. Denies any numbness or tingling. Denies any new symptoms. States that the pain has started to give her some trouble with sleep.    Past medical history, social, surgical and family history all reviewed in electronic medical record.   Review of Systems: No headache, visual changes, nausea, vomiting, diarrhea, constipation, dizziness, abdominal pain, skin rash, fevers, chills, night sweats, weight loss, swollen lymph nodes, body aches, joint swelling, muscle aches, chest pain, shortness of breath, mood changes.   Objective Blood pressure 118/80, pulse 75, weight 169 lb (76.658 kg), SpO2 97.00%.  General: No apparent distress alert and oriented x3 mood and affect normal, dressed appropriately.  HEENT: Pupils equal, extraocular movements intact  Respiratory: Patient's speak in full sentences and does not appear short of breath  Cardiovascular: No lower extremity edema, non tender, no erythema  Skin: Warm dry intact with no signs of infection or rash on extremities or on axial skeleton.  Abdomen: Soft nontender  Neuro: Cranial nerves II through XII are intact, neurovascularly intact in all extremities with 2+ DTRs and 2+ pulses.    Lymph: No lymphadenopathy of posterior or anterior cervical chain or axillae bilaterally.  Gait normal with good balance and coordination.  MSK:  Non tender with full range of motion and good stability and symmetric strength and tone of shoulders, elbows, wrist, hip (does have replacement on right side), and ankles bilaterally.  Knee: Right knee Normal to inspection but does have significant osteoarthritic changes. Palpation show severe tenderness to palpation over the medial joint line bilaterally. ROM full in flexion and extension and lower leg rotation. Ligaments with solid consistent endpoints including ACL, PCL, LCL, MCL. Negative Mcmurray's, Apley's, and Thessalonian tests.  painful patellar compression Patellar glide with moderate crepitus. Patellar and quadriceps tendons unremarkable. Hamstring and quadriceps strength is normal.   After informed written and verbal consent, patient was seated on exam table. Right knee was prepped with alcohol swab and utilizing anterolateral approach, patient's right knee space was injected with 4:1  marcaine 0.5%: Kenalog 40mg /dL. Patient tolerated the procedure well without immediate complications.   Impression and Recommendations:     This case required medical decision making of moderate complexity.

## 2013-05-30 NOTE — Assessment & Plan Note (Signed)
Patient was given an injection today. Patient tolerated the procedure well and was also fitted with a brace that I think would be beneficial. We discussed continuing the icing regimen. We also discussed with patient the possibility of formal physical therapy. Patient will consider this. Patient will continue with the topical anti-inflammatory. Patient come back in 3-4 weeks. Patient continues to have pain she would have that failed conservative therapy and she would be a minute for viscous supplementation.

## 2013-05-30 NOTE — Patient Instructions (Signed)
Very good to see you We did another injection today that will hopefully help Try the pennsaid twice daily. If you like it call me in 1 week and I will send in a prescription.  Stop Mobic.  Continue the exercises 3 times a week.  Wear brace with a lot of activity.  Come back in 3-4 weeks. If still in pain will start synvisc.

## 2013-05-31 ENCOUNTER — Other Ambulatory Visit: Payer: Self-pay | Admitting: Internal Medicine

## 2013-06-20 ENCOUNTER — Ambulatory Visit (INDEPENDENT_AMBULATORY_CARE_PROVIDER_SITE_OTHER): Payer: Medicare Other | Admitting: Family Medicine

## 2013-06-20 ENCOUNTER — Encounter: Payer: Self-pay | Admitting: Family Medicine

## 2013-06-20 VITALS — BP 112/74 | HR 84 | Ht 63.0 in | Wt 170.0 lb

## 2013-06-20 DIAGNOSIS — M1710 Unilateral primary osteoarthritis, unspecified knee: Secondary | ICD-10-CM | POA: Diagnosis not present

## 2013-06-20 MED ORDER — DICLOFENAC SODIUM 2 % TD SOLN: 2.0000 "application " | Freq: Two times a day (BID) | TRANSDERMAL | Status: DC

## 2013-06-20 NOTE — Assessment & Plan Note (Addendum)
Patient does have severe bilateral lateral compartment degenerative changes. Patient has failed all conservative therapy including oral as well as topical anti-inflammatories, icing protocol, bracing, and formal physical therapy. Patient states she like to try topical anti-inflammatory and patient did have prescription filled today. Patient was given the first of 3 injections of Synvisc today. Patient will try this and come back again in one week for the second injection. We discussed all patient's questions as well as her daughter questions today. We showed patient once again how to wear brace appropriately. We will see patient in one week.  Spent greater than 25 minutes with patient face-to-face and had greater than 50% of counseling including as described above in assessment and plan.

## 2013-06-20 NOTE — Progress Notes (Signed)
  [facility] [place], [place] [address] Phone: [phone] Subjective:      CC: knee pain right side followup   [id number] Sandy Barrett is a 80 y.o. female coming in with complaint of  Knee pain. Patient does have severe lateral compartment degenerative changes of the knees bilaterally. Patient had injections one month ago into the right knee. Patient has continued doing over-the-counter medications as well as and icing. Patient has failed all other conservative therapy including formal physical therapy. Patient states that the steroid injection did not last too long. Patient states that pain has come back as well. Patient states that is stopping her from different activities. Patient has been wearing the brace fairly regularly which is somewhat beneficial. Denies any nighttime awakening.    patient's x-rays show that she does have severe lateral compartment degenerative changes bilaterally.  Past medical history, social, surgical and family history all reviewed in electronic medical record.   Review of Systems: No headache, visual changes, nausea, vomiting, diarrhea, constipation, dizziness, abdominal pain, skin rash, fevers, chills, night sweats, weight loss, swollen lymph nodes, body aches, joint swelling, muscle aches, chest pain, shortness of breath, mood changes.   Objective There were no vitals taken for this visit.  General: No apparent distress alert and oriented x3 mood and affect normal, dressed appropriately.  HEENT: Pupils equal, extraocular movements intact  Respiratory: Patient's speak in full sentences and does not appear short of breath  Cardiovascular: No lower extremity edema, non tender, no erythema  Skin: Warm dry intact with no signs of infection or rash on extremities or on axial skeleton.  Abdomen: Soft nontender  Neuro: Cranial nerves II through XII are intact, neurovascularly intact in all extremities with 2+ DTRs  and 2+ pulses.  Lymph: No lymphadenopathy of posterior or anterior cervical chain or axillae bilaterally.  Gait normal with good balance and coordination.  MSK:  Non tender with full range of motion and good stability and symmetric strength and tone of shoulders, elbows, wrist, hip (does have replacement on right side), and ankles bilaterally.  Knee: Right knee Normal to inspection but does have significant osteoarthritic changes. Palpation show severe tenderness to palpation over the medial joint line bilaterally. ROM full in flexion and extension and lower leg rotation. Ligaments with solid consistent endpoints including ACL, PCL, LCL, MCL. Negative Mcmurray's, Apley's, and Thessalonian tests.  painful patellar compression Patellar glide with moderate crepitus. Patellar and quadriceps tendons unremarkable. Hamstring and quadriceps strength is normal.   16 mg/2.5 mL of Synvisc (sodium hyaluronate) in a prefilled syringe was injected easily into the right knee through anterior lateral approach with a 22-gauge needle 2 inch needle..   Impression and Recommendations:     This case required medical decision making of moderate complexity.

## 2013-06-20 NOTE — Patient Instructions (Addendum)
It is good to see you as always.  We started synvisc and will see you next week.  Ice still at end of the day or at the end of a lot of activity Try topical medicine I am sending you and you can call them.  Can put it on 2 times  Daily.  Wear the brace with the pressure on the inside See you again in 1 week.

## 2013-06-23 ENCOUNTER — Telehealth: Payer: Self-pay

## 2013-06-23 NOTE — Telephone Encounter (Signed)
Phone call from patient requesting a call back from you on Monday. The matter she states is non urgent

## 2013-06-26 NOTE — Telephone Encounter (Signed)
Patient states that she did receive the pen set medication that Dr. Tamala Julian prescribed for her, however she says that she thought the pen set was going to be free. She does not think it was free because someone called and collected her insurance information before they were able to send her medication. This is what she wants a call to discuss. Please advise.

## 2013-06-26 NOTE — Telephone Encounter (Signed)
lmvom for pt to return call.  

## 2013-06-27 ENCOUNTER — Ambulatory Visit (INDEPENDENT_AMBULATORY_CARE_PROVIDER_SITE_OTHER): Payer: Medicare Other | Admitting: Family Medicine

## 2013-06-27 ENCOUNTER — Encounter: Payer: Self-pay | Admitting: Family Medicine

## 2013-06-27 VITALS — BP 110/80 | HR 77 | Ht 63.0 in | Wt 168.0 lb

## 2013-06-27 DIAGNOSIS — M1710 Unilateral primary osteoarthritis, unspecified knee: Secondary | ICD-10-CM

## 2013-06-27 DIAGNOSIS — M1711 Unilateral primary osteoarthritis, right knee: Secondary | ICD-10-CM

## 2013-06-27 NOTE — Telephone Encounter (Signed)
Discussed with pt @ [facility].

## 2013-06-27 NOTE — Patient Instructions (Signed)
You are doing great Sock brace with short trips and large brace when walking a lot Continue the ice and exercises See me again in 1 week.

## 2013-06-27 NOTE — Assessment & Plan Note (Signed)
Patient was given the second injection of Synvisc today. Patient tolerated the procedure very well. We discussed icing and continuing with the brace on an as-needed basis. Patient will come back in one week for the third and final injection of the Synvisc into her right knee.

## 2013-06-27 NOTE — Progress Notes (Signed)
  [facility] [address] [place], [place] [address] Phone: [phone] Subjective:      CC: knee pain right side followup   [id number] Sandy Barrett is a 80 y.o. female coming in with complaint of  Knee pain. Patient does have severe lateral compartment degenerative changes of the knees bilaterally. Patient started Synvisc supplementation. Patient was given the first injection and states that she did make some improvement. Patient states certain daily activities has become easier. Patient is happy with the result so far.     Past medical history, social, surgical and family history all reviewed in electronic medical record.   Review of Systems: No headache, visual changes, nausea, vomiting, diarrhea, constipation, dizziness, abdominal pain, skin rash, fevers, chills, night sweats, weight loss, swollen lymph nodes, body aches, joint swelling, muscle aches, chest pain, shortness of breath, mood changes.   Objective Blood pressure 110/80, pulse 77, height 5\' 3"  (1.6 m), weight 168 lb (76.204 kg), SpO2 97.00%.  General: No apparent distress alert and oriented x3 mood and affect normal, dressed appropriately.  HEENT: Pupils equal, extraocular movements intact  Respiratory: Patient's speak in full sentences and does not appear short of breath  Cardiovascular: No lower extremity edema, non tender, no erythema  Skin: Warm dry intact with no signs of infection or rash on extremities or on axial skeleton.  Abdomen: Soft nontender  Neuro: Cranial nerves II through XII are intact, neurovascularly intact in all extremities with 2+ DTRs and 2+ pulses.  Lymph: No lymphadenopathy of posterior or anterior cervical chain or axillae bilaterally.  Gait normal with good balance and coordination.  MSK:  Non tender with full range of motion and good stability and symmetric strength and tone of shoulders, elbows, wrist, hip (does have replacement on right side), and ankles  bilaterally.  Knee: Right knee Normal to inspection but does have significant osteoarthritic changes. Palpation show severe tenderness to palpation over the medial joint line bilaterally. ROM full in flexion and extension and lower leg rotation. Ligaments with solid consistent endpoints including ACL, PCL, LCL, MCL. Negative Mcmurray's, Apley's, and Thessalonian tests.  painful patellar compression Patellar glide with moderate crepitus. Patellar and quadriceps tendons unremarkable. Hamstring and quadriceps strength is normal.   16 mg/2.5 mL of Synvisc (sodium hyaluronate) in a prefilled syringe was injected easily into the right knee through anterior lateral approach with a 22-gauge needle 2 inch needle..   Impression and Recommendations:     This case required medical decision making of moderate complexity.

## 2013-07-04 ENCOUNTER — Encounter: Payer: Self-pay | Admitting: Family Medicine

## 2013-07-04 ENCOUNTER — Ambulatory Visit (INDEPENDENT_AMBULATORY_CARE_PROVIDER_SITE_OTHER): Payer: Medicare Other | Admitting: Family Medicine

## 2013-07-04 VITALS — BP 118/80 | HR 73 | Ht 63.0 in | Wt 170.0 lb

## 2013-07-04 DIAGNOSIS — M1711 Unilateral primary osteoarthritis, right knee: Secondary | ICD-10-CM

## 2013-07-04 DIAGNOSIS — M1710 Unilateral primary osteoarthritis, unspecified knee: Secondary | ICD-10-CM | POA: Diagnosis not present

## 2013-07-04 NOTE — Assessment & Plan Note (Addendum)
Patient attends third and final injection into the right knee and the Synvisc injection. Patient continued home exercises and and bracing as needed. Patient will follow up in 4 weeks for further evaluation and treatment.

## 2013-07-04 NOTE — Progress Notes (Signed)
  [facility] [place],  [address] Phone: [phone] Subjective:      CC: knee pain right side followup Synvisc #3  [id number] [name] [name] is a 80 y.o. female coming in with complaint of  Knee pain. Patient does have severe lateral compartment degenerative changes of the knees bilaterally. Patient started Synvisc supplementation of the right knee. Patient is on her third and final injection today. Patient has noticed about 20-30% improvement. Patient states that she's been doing more activity which is causing new dull aching pain in the knee but overall continues to improve. Wears her brace as needed and is doing the topical anti-inflammatory.     Past medical history, social, surgical and family history all reviewed in electronic medical record.   Review of Systems: No headache, visual changes, nausea, vomiting, diarrhea, constipation, dizziness, abdominal pain, skin rash, fevers, chills, night sweats, weight loss, swollen lymph nodes, body aches, joint swelling, muscle aches, chest pain, shortness of breath, mood changes.   Objective Blood pressure 118/80, pulse 73, height 5\' 3"  (1.6 m), weight 170 lb (77.111 kg), SpO2 98.00%.  General: No apparent distress alert and oriented x3 mood and affect normal, dressed appropriately.  HEENT: Pupils equal, extraocular movements intact  Respiratory: Patient's speak in full sentences and does not appear short of breath  Cardiovascular: No lower extremity edema, non tender, no erythema  Skin: Warm dry intact with no signs of infection or rash on extremities or on axial skeleton.  Abdomen: Soft nontender  Neuro: Cranial nerves II through XII are intact, neurovascularly intact in all extremities with 2+ DTRs and 2+ pulses.  Lymph: No lymphadenopathy of posterior or anterior cervical chain or axillae bilaterally.  Gait normal with good balance and coordination.  MSK:  Non tender with full  range of motion and good stability and symmetric strength and tone of shoulders, elbows, wrist, hip (does have replacement on right side), and ankles bilaterally.  Knee: Right knee Normal to inspection but does have significant osteoarthritic changes. Palpation show severe tenderness to palpation over the medial joint line bilaterally. ROM full in flexion and extension and lower leg rotation. Ligaments with solid consistent endpoints including ACL, PCL, LCL, MCL. Negative Mcmurray's, Apley's, and Thessalonian tests.  painful patellar compression Patellar glide with moderate crepitus. Patellar and quadriceps tendons unremarkable. Hamstring and quadriceps strength is normal.   16 mg/2.5 mL of Synvisc (sodium hyaluronate) in a prefilled syringe was injected easily into the right knee through anterior lateral approach with a 22-gauge needle 2 inch needle..   Impression and Recommendations:     This case required medical decision making of moderate complexity.

## 2013-07-04 NOTE — Patient Instructions (Addendum)
Good to see you as always.  You are doing great.  This is your 3rd and final injection Continue exercises 3 times a week and wear brace when you need it.  Continue the other meds See you again in 4 weeks.

## 2013-07-11 ENCOUNTER — Telehealth: Payer: Self-pay | Admitting: *Deleted

## 2013-07-11 NOTE — Telephone Encounter (Signed)
Discussed with pt

## 2013-07-11 NOTE — Telephone Encounter (Signed)
Left msg on triage wanting to speak with Ria Comment...[name]

## 2013-07-13 ENCOUNTER — Telehealth: Payer: Self-pay | Admitting: *Deleted

## 2013-07-13 NOTE — Telephone Encounter (Signed)
Left msg on pt's vmail.

## 2013-07-13 NOTE — Telephone Encounter (Signed)
Pt stated wanted to inform Ria Comment that her knee is feeling a little better. Is there anything else she need to be doing. When calling back she state if she doesn't answer to leave msg on vm...[name]

## 2013-08-01 ENCOUNTER — Ambulatory Visit (INDEPENDENT_AMBULATORY_CARE_PROVIDER_SITE_OTHER): Payer: Medicare Other | Admitting: Family Medicine

## 2013-08-01 ENCOUNTER — Encounter: Payer: Self-pay | Admitting: Family Medicine

## 2013-08-01 VITALS — BP 112/80 | HR 75 | Ht 63.0 in | Wt 171.0 lb

## 2013-08-01 DIAGNOSIS — M1710 Unilateral primary osteoarthritis, unspecified knee: Secondary | ICD-10-CM | POA: Diagnosis not present

## 2013-08-01 DIAGNOSIS — M1711 Unilateral primary osteoarthritis, right knee: Secondary | ICD-10-CM

## 2013-08-01 MED ORDER — TRAMADOL HCL 50 MG PO TABS: 50.0000 mg | ORAL_TABLET | Freq: Two times a day (BID) | ORAL | Status: DC

## 2013-08-01 NOTE — Patient Instructions (Signed)
Good to see you Try the new brace and see if it is more comfortable.  Ice is  Still your friend You can take tramadol 2 times daily as needed Start with a half tab to make sure you are not too tired.  Try the pennsaid again and see if that helps.  Exercises 3 times a week.  Come back again in 3-4 weeks and check in.

## 2013-08-01 NOTE — Assessment & Plan Note (Signed)
Patient was given another brace at this time that is a lower profile and does not rub. Patient is to try that and encourage her to do the topical anti-inflammatories and discontinue the meloxicam. Patient was also given a prescription for breakthrough pain with tramadol that it'll be helpful but warned of potential side effects. We discussed continuing the icing. We also discussed the need for good shoes. Patient will try these interventions and come back again in 3 weeks. At that time I can repeat the corticosteroid injection if necessary. Patient continues to have pain we may need to consider surgical intervention.  Spent greater than 25 minutes with patient face-to-face and had greater than 50% of counseling including as described above in assessment and plan.

## 2013-08-01 NOTE — Progress Notes (Signed)
  [facility] [address] [place], [place] [address] Phone: [phone] Subjective:      CC: Right knee pain followup  [id number] Sandy Barrett is a 80 y.o. female coming in with complaint of  Knee pain. Patient does have severe lateral compartment degenerative changes. Patient did complete the Synvisc series one month ago. Patient continues to attempt to try to wear a brace but has not been beneficial. Patient states that is rubbing on her other side. Continues to have pain in this knee. It is stopping her from other activities which makes her concerned. Denies any new symptoms such as radiation but does not know pain did improve after the Synvisc injections. Patient has not been using the topical anti-inflammatories because she is scared of some side effects. Continues to meloxicam instead but is having some mild stomach discomfort.    Past medical history, social, surgical and family history all reviewed in electronic medical record.   Review of Systems: No headache, visual changes, nausea, vomiting, diarrhea, constipation, dizziness, abdominal pain, skin rash, fevers, chills, night sweats, weight loss, swollen lymph nodes, body aches, joint swelling, muscle aches, chest pain, shortness of breath, mood changes.   Objective Blood pressure 112/80, pulse 75, height 5\' 3"  (1.6 m), weight 171 lb (77.565 kg), SpO2 97.00%.  General: No apparent distress alert and oriented x3 mood and affect normal, dressed appropriately.  HEENT: Pupils equal, extraocular movements intact  Respiratory: Patient's speak in full sentences and does not appear short of breath  Cardiovascular: No lower extremity edema, non tender, no erythema  Skin: Warm dry intact with no signs of infection or rash on extremities or on axial skeleton.  Abdomen: Soft nontender  Neuro: Cranial nerves II through XII are intact, neurovascularly intact in all extremities with 2+ DTRs and 2+ pulses.    Lymph: No lymphadenopathy of posterior or anterior cervical chain or axillae bilaterally.  Gait normal with good balance and coordination.  MSK:  Non tender with full range of motion and good stability and symmetric strength and tone of shoulders, elbows, wrist, hip (does have replacement on right side), and ankles bilaterally.  Knee: Right knee Normal to inspection but does have significant osteoarthritic changes with mild varus deformity of the right knee compared to left knee. Palpation show severe tenderness to palpation over the medial joint line bilaterally. ROM full in flexion and extension and lower leg rotation. Ligaments with solid consistent endpoints including ACL, PCL, LCL, MCL. Negative Mcmurray's, Apley's, and Thessalonian tests.  painful patellar compression Patellar glide with moderate crepitus. Patellar and quadriceps tendons unremarkable. Hamstring and quadriceps strength is normal.     Impression and Recommendations:     This case required medical decision making of moderate complexity.

## 2013-08-07 ENCOUNTER — Ambulatory Visit (INDEPENDENT_AMBULATORY_CARE_PROVIDER_SITE_OTHER): Payer: Medicare Other | Admitting: Internal Medicine

## 2013-08-07 ENCOUNTER — Encounter: Payer: Self-pay | Admitting: Internal Medicine

## 2013-08-07 ENCOUNTER — Other Ambulatory Visit (INDEPENDENT_AMBULATORY_CARE_PROVIDER_SITE_OTHER): Payer: Medicare Other

## 2013-08-07 VITALS — BP 118/70 | HR 73 | Temp 97.7°F | Ht 63.0 in | Wt 171.0 lb

## 2013-08-07 DIAGNOSIS — R5383 Other fatigue: Secondary | ICD-10-CM | POA: Diagnosis not present

## 2013-08-07 DIAGNOSIS — M1990 Unspecified osteoarthritis, unspecified site: Secondary | ICD-10-CM

## 2013-08-07 DIAGNOSIS — I10 Essential (primary) hypertension: Secondary | ICD-10-CM

## 2013-08-07 DIAGNOSIS — R5381 Other malaise: Secondary | ICD-10-CM

## 2013-08-07 DIAGNOSIS — Z0000 Encounter for general adult medical examination without abnormal findings: Secondary | ICD-10-CM | POA: Diagnosis not present

## 2013-08-07 LAB — CBC WITH DIFFERENTIAL/PLATELET
Basophils Absolute: 0 10*3/uL (ref 0.0–0.1)
Basophils Relative: 0.4 % (ref 0.0–3.0)
Eosinophils Absolute: 0.2 10*3/uL (ref 0.0–0.7)
Eosinophils Relative: 2.8 % (ref 0.0–5.0)
HCT: 39.1 % (ref 36.0–46.0)
Hemoglobin: 12.8 g/dL (ref 12.0–15.0)
Lymphocytes Relative: 16.3 % (ref 12.0–46.0)
Lymphs Abs: 1 10*3/uL (ref 0.7–4.0)
MCHC: 32.8 g/dL (ref 30.0–36.0)
MCV: 92.7 fl (ref 78.0–100.0)
Monocytes Absolute: 0.5 10*3/uL (ref 0.1–1.0)
Monocytes Relative: 7.9 % (ref 3.0–12.0)
Neutro Abs: 4.7 10*3/uL (ref 1.4–7.7)
Neutrophils Relative %: 72.6 % (ref 43.0–77.0)
Platelets: 540 10*3/uL — ABNORMAL HIGH (ref 150.0–400.0)
RBC: 4.22 Mil/uL (ref 3.87–5.11)
RDW: 16.2 % — ABNORMAL HIGH (ref 11.5–15.5)
WBC: 6.4 10*3/uL (ref 4.0–10.5)

## 2013-08-07 LAB — BASIC METABOLIC PANEL
BUN: 17 mg/dL (ref 6–23)
CO2: 31 mEq/L (ref 19–32)
Calcium: 9.2 mg/dL (ref 8.4–10.5)
Chloride: 100 mEq/L (ref 96–112)
Creatinine, Ser: 0.6 mg/dL (ref 0.4–1.2)
GFR: 124.03 mL/min (ref >60.00)
Glucose, Bld: 88 mg/dL (ref 70–99)
Potassium: 3.5 mEq/L (ref 3.5–5.1)
Sodium: 137 mEq/L (ref 135–145)

## 2013-08-07 LAB — LIPID PANEL
Cholesterol: 126 mg/dL (ref 0–200)
HDL: 51.2 mg/dL (ref >39.00)
LDL Cholesterol: 67 mg/dL (ref 0–99)
NonHDL: 74.8
Total CHOL/HDL Ratio: 2
Triglycerides: 39 mg/dL (ref 0.0–149.0)
VLDL: 7.8 mg/dL (ref 0.0–40.0)

## 2013-08-07 LAB — HEPATIC FUNCTION PANEL
ALT: 7 U/L (ref 0–35)
AST: 15 U/L (ref 0–37)
Albumin: 3.6 g/dL (ref 3.5–5.2)
Alkaline Phosphatase: 59 U/L (ref 39–117)
Bilirubin, Direct: 0.1 mg/dL (ref 0.0–0.3)
Total Bilirubin: 0.7 mg/dL (ref 0.2–1.2)
Total Protein: 6.8 g/dL (ref 6.0–8.3)

## 2013-08-07 LAB — TSH: TSH: 0.7 u[IU]/mL (ref 0.35–4.50)

## 2013-08-07 MED ORDER — MELOXICAM 7.5 MG PO TABS: 7.5000 mg | ORAL_TABLET | Freq: Every day | ORAL | Status: DC

## 2013-08-07 MED ORDER — TRAMADOL HCL 50 MG PO TABS: 50.0000 mg | ORAL_TABLET | Freq: Three times a day (TID) | ORAL | Status: DC

## 2013-08-07 NOTE — Patient Instructions (Addendum)
It was good to see you today.  We have reviewed your prior records including labs and tests today  Health Maintenance reviewed - all recommended immunizations and age-appropriate screenings are up-to-date.  Test(s) ordered today. Your results will be released to Louise (or called to you) after review, usually within 72hours after test completion. If any changes need to be made, you will be notified at that same time.  Medications reviewed and updated Ok to resume meloxicam once every AM and increase ultram to 3x/day until you see Dr Tamala Julian No other changes recommended at this time.  Your prescription(s) have been submitted to your pharmacy. Please take as directed and contact our office if you believe you are having problem(s) with the medication(s).  Please schedule followup in 6 months for semiannual exam and labs, call sooner if problems.  Health Maintenance Adopting a healthy lifestyle and getting preventive care can go a long way to promote health and wellness. Talk with your health care provider about what schedule of regular examinations is right for you. This is a good chance for you to check in with your provider about disease prevention and staying healthy. In between checkups, there are plenty of things you can do on your own. Experts have done a lot of research about which lifestyle changes and preventive measures are most likely to keep you healthy. Ask your health care provider for more information. WEIGHT AND DIET  Eat a healthy diet  Be sure to include plenty of vegetables, fruits, low-fat dairy products, and lean protein.  Do not eat a lot of foods high in solid fats, added sugars, or salt.  Get regular exercise. This is one of the most important things you can do for your health.  Most adults should exercise for at least 150 minutes each week. The exercise should increase your heart rate and make you sweat (moderate-intensity exercise).  Most adults should also do  strengthening exercises at least twice a week. This is in addition to the moderate-intensity exercise.  Maintain a healthy weight  Body mass index (BMI) is a measurement that can be used to identify possible weight problems. It estimates body fat based on height and weight. Your health care provider can help determine your BMI and help you achieve or maintain a healthy weight.  For females 78 years of age and older:   A BMI below 18.5 is considered underweight.  A BMI of 18.5 to 24.9 is normal.  A BMI of 25 to 29.9 is considered overweight.  A BMI of 30 and above is considered obese.  Watch levels of cholesterol and blood lipids  You should start having your blood tested for lipids and cholesterol at 78 years of age, then have this test every 5 years.  You may need to have your cholesterol levels checked more often if:  Your lipid or cholesterol levels are high.  You are older than 79 years of age.  You are at high risk for heart disease.  CANCER SCREENING   Lung Cancer  Lung cancer screening is recommended for adults 30-74 years old who are at high risk for lung cancer because of a history of smoking.  A yearly low-dose CT scan of the lungs is recommended for people who:  Currently smoke.  Have quit within the past 15 years.  Have at least a 30-pack-year history of smoking. A pack year is smoking an average of one pack of cigarettes a day for 1 year.  Yearly screening should continue  until it has been 15 years since you quit.  Yearly screening should stop if you develop a health problem that would prevent you from having lung cancer treatment.  Breast Cancer  Practice breast self-awareness. This means understanding how your breasts normally appear and feel.  It also means doing regular breast self-exams. Let your health care provider know about any changes, no matter how small.  If you are in your 20s or 30s, you should have a clinical breast exam (CBE) by a  health care provider every 1-3 years as part of a regular health exam.  If you are 19 or older, have a CBE every year. Also consider having a breast X-ray (mammogram) every year.  If you have a family history of breast cancer, talk to your health care provider about genetic screening.  If you are at high risk for breast cancer, talk to your health care provider about having an MRI and a mammogram every year.  Breast cancer gene (BRCA) assessment is recommended for women who have family members with BRCA-related cancers. BRCA-related cancers include:  Breast.  Ovarian.  Tubal.  Peritoneal cancers.  Results of the assessment will determine the need for genetic counseling and BRCA1 and BRCA2 testing. Cervical Cancer Routine pelvic examinations to screen for cervical cancer are no longer recommended for nonpregnant women who are considered low risk for cancer of the pelvic organs (ovaries, uterus, and vagina) and who do not have symptoms. A pelvic examination may be necessary if you have symptoms including those associated with pelvic infections. Ask your health care provider if a screening pelvic exam is right for you.   The Pap test is the screening test for cervical cancer for women who are considered at risk.  If you had a hysterectomy for a problem that was not cancer or a condition that could lead to cancer, then you no longer need Pap tests.  If you are older than 65 years, and you have had normal Pap tests for the past 10 years, you no longer need to have Pap tests.  If you have had past treatment for cervical cancer or a condition that could lead to cancer, you need Pap tests and screening for cancer for at least 20 years after your treatment.  If you no longer get a Pap test, assess your risk factors if they change (such as having a new sexual partner). This can affect whether you should start being screened again.  Some women have medical problems that increase their chance of  getting cervical cancer. If this is the case for you, your health care provider may recommend more frequent screening and Pap tests.  The human papillomavirus (HPV) test is another test that may be used for cervical cancer screening. The HPV test looks for the virus that can cause cell changes in the cervix. The cells collected during the Pap test can be tested for HPV.  The HPV test can be used to screen women 44 years of age and older. Getting tested for HPV can extend the interval between normal Pap tests from three to five years.  An HPV test also should be used to screen women of any age who have unclear Pap test results.  After 78 years of age, women should have HPV testing as often as Pap tests.  Colorectal Cancer  This type of cancer can be detected and often prevented.  Routine colorectal cancer screening usually begins at 78 years of age and continues through 78 years of  age.  Your health care provider may recommend screening at an earlier age if you have risk factors for colon cancer.  Your health care provider may also recommend using home test kits to check for hidden blood in the stool.  A small camera at the end of a tube can be used to examine your colon directly (sigmoidoscopy or colonoscopy). This is done to check for the earliest forms of colorectal cancer.  Routine screening usually begins at age 70.  Direct examination of the colon should be repeated every 5-10 years through 78 years of age. However, you may need to be screened more often if early forms of precancerous polyps or small growths are found. Skin Cancer  Check your skin from head to toe regularly.  Tell your health care provider about any new moles or changes in moles, especially if there is a change in a mole's shape or color.  Also tell your health care provider if you have a mole that is larger than the size of a pencil eraser.  Always use sunscreen. Apply sunscreen liberally and repeatedly  throughout the day.  Protect yourself by wearing long sleeves, pants, a wide-brimmed hat, and sunglasses whenever you are outside. HEART DISEASE, DIABETES, AND HIGH BLOOD PRESSURE   Have your blood pressure checked at least every 1-2 years. High blood pressure causes heart disease and increases the risk of stroke.  If you are between 34 years and 22 years old, ask your health care provider if you should take aspirin to prevent strokes.  Have regular diabetes screenings. This involves taking a blood sample to check your fasting blood sugar level.  If you are at a normal weight and have a low risk for diabetes, have this test once every three years after 78 years of age.  If you are overweight and have a high risk for diabetes, consider being tested at a younger age or more often. PREVENTING INFECTION  Hepatitis B  If you have a higher risk for hepatitis B, you should be screened for this virus. You are considered at high risk for hepatitis B if:  You were born in a country where hepatitis B is common. Ask your health care provider which countries are considered high risk.  Your parents were born in a high-risk country, and you have not been immunized against hepatitis B (hepatitis B vaccine).  You have HIV or AIDS.  You use needles to inject street drugs.  You live with someone who has hepatitis B.  You have had sex with someone who has hepatitis B.  You get hemodialysis treatment.  You take certain medicines for conditions, including cancer, organ transplantation, and autoimmune conditions. Hepatitis C  Blood testing is recommended for:  Everyone born from [birth year] through 1963.  Anyone with known risk factors for hepatitis C. Sexually transmitted infections (STIs)  You should be screened for sexually transmitted infections (STIs) including gonorrhea and chlamydia if:  You are sexually active and are younger than 78 years of age.  You are older than 78 years of age and your  health care provider tells you that you are at risk for this type of infection.  Your sexual activity has changed since you were last screened and you are at an increased risk for chlamydia or gonorrhea. Ask your health care provider if you are at risk.  If you do not have HIV, but are at risk, it may be recommended that you take a prescription medicine daily to prevent HIV infection.  This is called pre-exposure prophylaxis (PrEP). You are considered at risk if:  You are sexually active and do not regularly use condoms or know the HIV status of your partner(s).  You take drugs by injection.  You are sexually active with a partner who has HIV. Talk with your health care provider about whether you are at high risk of being infected with HIV. If you choose to begin PrEP, you should first be tested for HIV. You should then be tested every 3 months for as long as you are taking PrEP.  PREGNANCY   If you are premenopausal and you may become pregnant, ask your health care provider about preconception counseling.  If you may become pregnant, take 400 to 800 micrograms (mcg) of folic acid every day.  If you want to prevent pregnancy, talk to your health care provider about birth control (contraception). OSTEOPOROSIS AND MENOPAUSE   Osteoporosis is a disease in which the bones lose minerals and strength with aging. This can result in serious bone fractures. Your risk for osteoporosis can be identified using a bone density scan.  If you are 1 years of age or older, or if you are at risk for osteoporosis and fractures, ask your health care provider if you should be screened.  Ask your health care provider whether you should take a calcium or vitamin D supplement to lower your risk for osteoporosis.  Menopause may have certain physical symptoms and risks.  Hormone replacement therapy may reduce some of these symptoms and risks. Talk to your health care provider about whether hormone replacement  therapy is right for you.  HOME CARE INSTRUCTIONS   Schedule regular health, dental, and eye exams.  Stay current with your immunizations.   Do not use any tobacco products including cigarettes, chewing tobacco, or electronic cigarettes.  If you are pregnant, do not drink alcohol.  If you are breastfeeding, limit how much and how often you drink alcohol.  Limit alcohol intake to no more than 1 drink per day for nonpregnant women. One drink equals 12 ounces of beer, 5 ounces of wine, or 1 ounces of hard liquor.  Do not use street drugs.  Do not share needles.  Ask your health care provider for help if you need support or information about quitting drugs.  Tell your health care provider if you often feel depressed.  Tell your health care provider if you have ever been abused or do not feel safe at home. Document Released: 07/07/2010 Document Revised: 05/08/2013 Document Reviewed: 11/23/2012 [facility] Patient Information 2015 [place], [place]. This information is not intended to replace advice given to you by your health care provider. Make sure you discuss any questions you have with your health care provider.

## 2013-08-07 NOTE — Assessment & Plan Note (Signed)
The current medical regimen is generally effective;  continue present plan and medications.

## 2013-08-07 NOTE — Progress Notes (Signed)
Subjective:    Patient ID: Sandy Barrett, female    DOB: 02/16/1932, 80 y.o.   MRN: [id number]  HPI   Here for medicare wellness  Diet: heart healthy  Physical activity: sedentary Depression/mood screen: negative Hearing: intact to whispered voice Visual acuity: grossly normal, performs annual eye exam  ADLs: capable Fall risk: none Home safety: good Cognitive evaluation: intact to orientation, naming, recall and repetition EOL planning: adv directives, full code/ I agree  I have personally reviewed and have noted 1. The patient's medical and social history 2. Their use of alcohol, tobacco or illicit drugs 3. Their current medications and supplements 4. The patient's functional ability including ADL's, fall risks, home safety risks and hearing or visual impairment. 5. Diet and physical activities 6. Evidence for depression or mood disorders  Also reviewed chronic medical issues and interval medical events  Past Medical History  Diagnosis Date  . HYPERTENSION   . HYPERTHYROIDISM   . OBESITY   . ARTHRITIS   . Posttraumatic stress disorder   . INSOMNIA   . MIGRAINE HEADACHE   . Hyperthyroidism     s/p I-131 ablation 03/2011 of multinod goiter  . Arthritis    Family History  Problem Relation Age of Onset  . Asthma Mother   . Asthma Father   . Prostate cancer Father    History  Substance Use Topics  . Smoking status: Never Smoker   . Smokeless tobacco: Not on file  . Alcohol Use: No    Review of Systems  Constitutional: Positive for fatigue. Negative for unexpected weight change.  Respiratory: Negative for cough, shortness of breath and wheezing.   Cardiovascular: Negative for chest pain, palpitations and leg swelling.  Gastrointestinal: Negative for nausea, abdominal pain and diarrhea.  Musculoskeletal: Positive for arthralgias, gait problem and joint swelling (R knee).  Neurological: Negative for dizziness, weakness, light-headedness and headaches.    Psychiatric/Behavioral: Negative for dysphoric mood. The patient is not nervous/anxious.   All other systems reviewed and are negative.      Objective:   Physical Exam  BP 118/70  Pulse 73  Temp(Src) 97.7 F (36.5 C) (Oral)  Ht 5\' 3"  (1.6 m)  Wt 171 lb (77.565 kg)  BMI 30.30 kg/m2  SpO2 94% Wt Readings from Last 3 Encounters:  08/07/13 171 lb (77.565 kg)  08/01/13 171 lb (77.565 kg)  07/04/13 170 lb (77.111 kg)   Constitutional: She is obese and appears well-developed and well-nourished. No distress.  Neck: Normal range of motion. Neck supple. No JVD present. No thyromegaly present.  Cardiovascular: Normal rate, regular rhythm and normal heart sounds.  No murmur heard. No BLE edema. Pulmonary/Chest: Effort normal and breath sounds normal. No respiratory distress. She has no wheezes.  MSkel: R knee - boggy synovitis - tender to palpation over joint line; FROM and ligamentous function intact -walks with antalgic gait because of same, but unaided Psychiatric: She has a normal mood and affect. Her behavior is normal. Judgment and thought content normal.   Lab Results  Component Value Date   WBC 7.3 04/28/2012   HGB 13.3 04/28/2012   HCT 39.8 04/28/2012   PLT 351 04/28/2012   GLUCOSE 82 08/31/2012   CHOL 133 08/31/2012   TRIG 29.0 08/31/2012   HDL 55.50 08/31/2012   LDLCALC 72 08/31/2012   ALT 7 04/28/2012   AST 12 04/28/2012   NA 139 08/31/2012   K 3.7 08/31/2012   CL 104 08/31/2012   CREATININE 0.5 08/31/2012   BUN  16 08/31/2012   CO2 34* 08/31/2012   TSH 0.67 08/31/2012    Dg Knee Bilateral Standing Ap  03/06/2013   CLINICAL DATA:  Right knee pain, unstable walking  EXAM: BILATERAL KNEES STANDING - 1 VIEW  COMPARISON:  None.  FINDINGS: Severe lateral compartment degenerative changes on the right.  Moderate to severe lateral compartment degenerative changes on the left.  No fracture is seen.  IMPRESSION: Moderate to severe degenerative changes in the lateral compartment bilaterally,  right greater than left.   Electronically Signed   By: Julian Hy M.D.   On: 03/06/2013 13:33   Dg Knee Complete 4 Views Right  03/06/2013   CLINICAL DATA:  Right knee pain  EXAM: RIGHT KNEE - COMPLETE 4+ VIEW  COMPARISON:  None.  FINDINGS: No fracture or dislocation is seen.  Severe lateral compartment degenerative changes. Moderate to severe patellofemoral compartment degenerative changes with prominent osteophytes. Mild joint space narrowing in the medial compartment.  Possible small suprapatellar knee joint effusion.  IMPRESSION: Moderate to severe tricompartmental degenerative changes, most prominent in the lateral compartment.   Electronically Signed   By: Julian Hy M.D.   On: 03/06/2013 13:43       Assessment & Plan:   AWV/v70.0 - Today patient counseled on age appropriate routine health concerns for screening and prevention, each reviewed and up to date or declined. Immunizations reviewed and up to date or declined. Labs ordered and reviewed. Risk factors for depression reviewed and negative. Hearing function and visual acuity are intact. ADLs screened and addressed as needed. Functional ability and level of safety reviewed and appropriate. Education, counseling and referrals performed based on assessed risks today. Patient provided with a copy of personalized plan for preventive services.  Fatigue - nonspecific symptoms/exam - check screening labs  Problem List Items Addressed This Visit   HYPERTENSION     The current medical regimen is generally effective;  continue present plan and medications.    Relevant Orders      Lipid panel      Basic metabolic panel   OSTEOARTHRITIS     Multiple locations: R shoulder - degenerative tear RTC on MRI - Increasing knee symptoms, R>L: boggy synovitis but no effusion on exam - reports steroid injection on left may 2014 with minimal relief synvisc series summer 2015 for R knee -?benefit wishes to avoid surgery at all costs Because  Dr Derry Lory left [place], changed to sports med here [facility] Hines) in summer 2015 Previously on NSAIDs, changed to narc TID and tylenol by [facility] early 2014 resume NSAID low dose 08/2012, on hold by sports med, but i feel ok to resume same and will monitor Cr -no edema and blood pressure controlled - reassurance provided Prev on Percocet, changed to tramadol by sports med summer 2015    Relevant Medications      meloxicam (MOBIC) tablet      traMADol (ULTRAM) 50 MG tablet    Other Visit Diagnoses   Routine general medical examination at a health care facility    -  Primary    Other fatigue        Relevant Orders       Basic metabolic panel       CBC with Differential       Hepatic function panel       TSH

## 2013-08-07 NOTE — Assessment & Plan Note (Signed)
Multiple locations: R shoulder - degenerative tear RTC on MRI - Increasing knee symptoms, R>L: boggy synovitis but no effusion on exam - reports steroid injection on left may 2014 with minimal relief synvisc series summer 2015 for R knee -?benefit wishes to avoid surgery at all costs Because Dr Sandy Barrett left [place], changed to sports med here Sandy Barrett) in summer 2015 Previously on NSAIDs, changed to narc TID and tylenol by [facility] early 2014 resume NSAID low dose 08/2012, on hold by sports med, but i feel ok to resume same and will monitor Cr -no edema and blood pressure controlled - reassurance provided Prev on Percocet, changed to tramadol by sports med summer 2015

## 2013-08-07 NOTE — Progress Notes (Signed)
Pre visit review using our clinic review tool, if applicable. No additional management support is needed unless otherwise documented below in the visit note. 

## 2013-08-16 ENCOUNTER — Telehealth: Payer: Self-pay | Admitting: *Deleted

## 2013-08-16 MED ORDER — PROPRANOLOL HCL ER 60 MG PO CP24: 60.0000 mg | ORAL_CAPSULE | Freq: Every day | ORAL | Status: DC

## 2013-08-16 NOTE — Telephone Encounter (Signed)
Left msg on triage stating the propanolol that she is taking they have a recall out. She is wanting to know does she continue or md change to something else...Sandy Barrett

## 2013-08-16 NOTE — Telephone Encounter (Signed)
I will change her medication to extended release propanolol to take once daily (instead of the currently prescribed short acting which she takes twice daily) Please have her pharmacy contact us if concerns regarding this prescription change Thanks

## 2013-08-16 NOTE — Telephone Encounter (Signed)
Notified pt with md response. Pt gave the wrong medication. She read letter that was given to her by her pharmacist they have recall out on hydrochlorothiazide.Marland KitchenJohny Chess

## 2013-08-18 MED ORDER — PROPRANOLOL HCL 20 MG PO TABS: 20.0000 mg | ORAL_TABLET | Freq: Two times a day (BID) | ORAL | Status: DC

## 2013-08-18 NOTE — Telephone Encounter (Signed)
Please call pharmacy - cancel rx for ext release inderal - keep on original propanolol (med list updated) Ok to not take any HCTZ until pharmacy gets new supply of same - no substitution recommended  thanks

## 2013-08-18 NOTE — Telephone Encounter (Signed)
Notified pt with md response. Also called pharmacy spoke with Lanelle Bal gave md response...Sandy Barrett

## 2013-08-29 ENCOUNTER — Ambulatory Visit (INDEPENDENT_AMBULATORY_CARE_PROVIDER_SITE_OTHER): Payer: Medicare Other | Admitting: Family Medicine

## 2013-08-29 ENCOUNTER — Encounter: Payer: Self-pay | Admitting: Family Medicine

## 2013-08-29 ENCOUNTER — Other Ambulatory Visit: Payer: Medicare Other

## 2013-08-29 ENCOUNTER — Ambulatory Visit (INDEPENDENT_AMBULATORY_CARE_PROVIDER_SITE_OTHER)
Admission: RE | Admit: 2013-08-29 | Discharge: 2013-08-29 | Disposition: A | Discharge status: Home / Self Care | Payer: Medicare Other | Source: Ambulatory Visit | Attending: Family Medicine | Admitting: Family Medicine

## 2013-08-29 VITALS — BP 110/72 | HR 85 | Ht 63.0 in | Wt 168.0 lb

## 2013-08-29 DIAGNOSIS — M549 Dorsalgia, unspecified: Secondary | ICD-10-CM | POA: Insufficient documentation

## 2013-08-29 DIAGNOSIS — M545 Low back pain, unspecified: Secondary | ICD-10-CM

## 2013-08-29 DIAGNOSIS — IMO0002 Reserved for concepts with insufficient information to code with codable children: Secondary | ICD-10-CM

## 2013-08-29 DIAGNOSIS — M5416 Radiculopathy, lumbar region: Secondary | ICD-10-CM

## 2013-08-29 DIAGNOSIS — M1710 Unilateral primary osteoarthritis, unspecified knee: Secondary | ICD-10-CM | POA: Diagnosis not present

## 2013-08-29 DIAGNOSIS — M5137 Other intervertebral disc degeneration, lumbosacral region: Secondary | ICD-10-CM | POA: Diagnosis not present

## 2013-08-29 MED ORDER — ESOMEPRAZOLE MAGNESIUM 40 MG PO CPDR: 40.0000 mg | DELAYED_RELEASE_CAPSULE | Freq: Every day | ORAL | Status: DC

## 2013-08-29 MED ORDER — TRAMADOL HCL 50 MG PO TABS: 50.0000 mg | ORAL_TABLET | Freq: Three times a day (TID) | ORAL | Status: DC

## 2013-08-29 NOTE — Assessment & Plan Note (Signed)
Patient does have back pain after fall. Patient has not complained significantly and does have a hard time expressing symptoms sometimes. I do feel that imaging is necessary. Back x-rays were ordered today. Discussed icing as well as home exercises. Tramadol as needed. Patient will come back and see me again in 3-4 weeks if not completely better. We would consider physical therapy

## 2013-08-29 NOTE — Patient Instructions (Signed)
Sorry about your fall.  We will get xray today Ice is your friend Exercises for your back 3 times a week We tried another injection in your knees. Your knees do have severe arthritis.  No new medication at this time.  Try the nexium for your stomach.  See you again in 3-4 weeks.

## 2013-08-29 NOTE — Progress Notes (Signed)
[facility] [address], [place] [address] Phone: [phone] Subjective:      CC: Bilateral knee pain followup Back pain after fall  [id number] Sandy Barrett is a 80 y.o. female coming in with complaint of  Knee pain. Patient does have severe lateral compartment degenerative changes. Patient did complete the Synvisc series 2 months ago. Patient was doing moderately better last month when she followed up with me. Patient continued with the bracing, icing, home exercises. Patient states needs to give her some discomfort mostly on the lateral aspect she states. Patient states the right is worse the left. Denies any new symptoms is worsening a previous symptoms.  Patient also states that she has new back pain. This is very dole overall. Have on Sunday after fall. Patient fell backwards after opening a door felt straight onto her buttocks area. Patient states it is just sore. Does not states that she's had any radicular pain down her legs. States though that the tramadol does not help significantly. Patient denies any nighttime awakening. Still able to ambulate and still able to sleep. Patient is a severity is 5/10.    Past medical history, social, surgical and family history all reviewed in electronic medical record.   Review of Systems: No headache, visual changes, nausea, vomiting, diarrhea, constipation, dizziness, abdominal pain, skin rash, fevers, chills, night sweats, weight loss, swollen lymph nodes, body aches, joint swelling, muscle aches, chest pain, shortness of breath, mood changes.   Objective Blood pressure 110/72, pulse 85, height 5\' 3"  (1.6 m), weight 168 lb (76.204 kg), SpO2 96.00%.  General: No apparent distress alert and oriented x3 mood and affect normal, dressed appropriately.  HEENT: Pupils equal, extraocular movements intact  Respiratory: Patient's speak in full sentences and does not appear short of breath  Cardiovascular:  No lower extremity edema, non tender, no erythema  Skin: Warm dry intact with no signs of infection or rash on extremities or on axial skeleton.  Abdomen: Soft nontender  Neuro: Cranial nerves II through XII are intact, neurovascularly intact in all extremities with 2+ DTRs and 2+ pulses.  Lymph: No lymphadenopathy of posterior or anterior cervical chain or axillae bilaterally.  Gait mild antalgic gait.  MSK:  Non tender with full range of motion and good stability and symmetric strength and tone of shoulders, elbows, wrist, hip (does have replacement on right side), and ankles bilaterally.  Knee: Bilateral knee Normal to inspection but does have significant osteoarthritic changes with mild varus deformity of the right knee compared to left knee. Palpation show  tenderness to palpation over the medial joint line bilaterally. ROM has decreased lacking the last 5 of flexion and extension Ligaments with solid consistent endpoints including ACL, PCL, LCL, MCL. Negative Mcmurray's, Apley's, and Thessalonian tests.  painful patellar compression Patellar glide with moderate crepitus. Patellar and quadriceps tendons unremarkable. Hamstring and quadriceps strength is normal.  Back Exam:  Inspection: Unremarkable  Motion: Flexion 35 deg, Extension 15 deg, Side Bending to 15 deg bilaterally,  Rotation to 15 deg bilaterally  SLR laying: Negative  XSLR laying: Negative  Palpable tenderness: Moderate tenderness to palpation over the right SI joint in the paraspinal musculature on the right side of the lumbar spine. FABER: Unable to do secondary to patient's hip replacement Sensory change: Gross sensation intact to all lumbar and sacral dermatomes.  Reflexes: 2+ at both patellar tendons, 2+ at achilles tendons, Babinski's downgoing.  Strength at foot  Plantar-flexion: 5/5 Dorsi-flexion: 5/5 Eversion: 5/5  Inversion: 5/5  Leg strength  Quad: 4/5 Hamstring: 5/5 Hip flexor: 5/5 Hip abductors: 4/5    After informed written and verbal consent, patient was seated on exam table. Right knee was prepped with alcohol swab and utilizing anterolateral approach, patient's right knee space was injected with 4:1  marcaine 0.5%: Kenalog 40mg /dL. Patient tolerated the procedure well without immediate complications.  After informed written and verbal consent, patient was seated on exam table. Left knee was prepped with alcohol swab and utilizing anterolateral approach, patient's left knee space was injected with 4:1  marcaine 0.5%: Kenalog 40mg /dL. Patient tolerated the procedure well without immediate complications.    Impression and Recommendations:     This case required medical decision making of moderate complexity.

## 2013-08-29 NOTE — Assessment & Plan Note (Signed)
Patient was given corticosteroid injections again today. It has been 3 months since previous attempt. Patient will continue to wear the bracing, icing,. The topical anti-inflammatories. Refill of tramadol was given today. Patient will come back and see me again in 3-4 weeks for further evaluation and treatment.  Spent greater than 25 minutes with patient face-to-face and had greater than 50% of counseling including as described above in assessment and plan

## 2013-10-03 ENCOUNTER — Ambulatory Visit (INDEPENDENT_AMBULATORY_CARE_PROVIDER_SITE_OTHER): Payer: Medicare Other | Admitting: Family Medicine

## 2013-10-03 ENCOUNTER — Encounter: Payer: Self-pay | Admitting: Family Medicine

## 2013-10-03 VITALS — BP 112/82 | HR 72 | Ht 63.0 in | Wt 167.0 lb

## 2013-10-03 DIAGNOSIS — M1710 Unilateral primary osteoarthritis, unspecified knee: Secondary | ICD-10-CM | POA: Diagnosis not present

## 2013-10-03 MED ORDER — DICLOFENAC SODIUM 1 % TD GEL: 2.0000 g | Freq: Three times a day (TID) | TRANSDERMAL | Status: DC

## 2013-10-03 NOTE — Assessment & Plan Note (Signed)
Patient does have severe osteophytic changes. Patient will add Tylenol to her tramadol that she is taking her scheduled amount. Prescription was given today. We discussed continuing icing. We discussed how to bracing to be beneficial. We discussed I would not like to do another clip a steroid injection into another 2 months. Patient's is going to try to get her affairs in order and when she does we may refer her to orthopedic surgery for surgical intervention which I think would be much more curative than the conservative approach we are trying at this time. Patient otherwise will come back in 2 months for further evaluation and followup.  Discussed at great length and answered all questions.  Spent greater than 25 minutes with patient face-to-face and had greater than 50% of counseling including as described above in assessment and plan.

## 2013-10-03 NOTE — Patient Instructions (Addendum)
Good to see you as always.  Ice when you need it.  Add 325mg  of acetaminophen to the tramadol 3 times daily.  Conitnue the exercises When you are ready to have replacement please call me and I will refer you.  Otherwise in 2 months and we can do another injection.

## 2013-10-03 NOTE — Progress Notes (Signed)
  [facility] [address] [place], [place] [address] Phone: [phone] Subjective:    CC: Bilateral knee pain followup  [id number] Sandy Barrett is a 80 y.o. female coming in with complaint of  Knee pain. Patient does have severe lateral compartment degenerative changes. Patient did complete the Synvisc series 3 months ago and CSI 1 month ago. Overall mildly improved. Ice has helped, not wearing braces, no new symptoms but continue to stop her form some activities prematurely. Still giving her trouble.  Did not want surgery but states she is starting to consider it once she gets her house in order.   Back pain is significantly better than previous exam.    Past medical history, social, surgical and family history all reviewed in electronic medical record.   Review of Systems: No headache, visual changes, nausea, vomiting, diarrhea, constipation, dizziness, abdominal pain, skin rash, fevers, chills, night sweats, weight loss, swollen lymph nodes, body aches, joint swelling, muscle aches, chest pain, shortness of breath, mood changes.   Objective Blood pressure 112/82, pulse 72, height 5\' 3"  (1.6 m), weight 167 lb (75.751 kg), SpO2 97.00%.  General: No apparent distress alert and oriented x3 mood and affect normal, dressed appropriately.  HEENT: Pupils equal, extraocular movements intact  Respiratory: Patient's speak in full sentences and does not appear short of breath  Cardiovascular: No lower extremity edema, non tender, no erythema  Skin: Warm dry intact with no signs of infection or rash on extremities or on axial skeleton.  Abdomen: Soft nontender  Neuro: Cranial nerves II through XII are intact, neurovascularly intact in all extremities with 2+ DTRs and 2+ pulses.  Lymph: No lymphadenopathy of posterior or anterior cervical chain or axillae bilaterally.  Gait mild antalgic gait.  MSK:  Non tender with full range of motion and good stability and  symmetric strength and tone of shoulders, elbows, wrist, hip (does have replacement on right side), and ankles bilaterally.  Knee: Bilateral knee Normal to inspection but does have significant osteoarthritic changes with mild varus deformity of the right knee compared to left knee. Palpation show  tenderness to palpation over the medial joint line bilaterally. ROM has decreased lacking the last 5 of flexion and extension Ligaments with solid consistent endpoints including ACL, PCL, LCL, MCL. Negative Mcmurray's, Apley's, and Thessalonian tests.  painful patellar compression Patellar glide with moderate crepitus. Patellar and quadriceps tendons unremarkable. Hamstring and quadriceps strength is normal.    Impression and Recommendations:     This case required medical decision making of moderate complexity.

## 2013-10-05 ENCOUNTER — Other Ambulatory Visit: Payer: Self-pay | Admitting: Family Medicine

## 2013-10-05 NOTE — Telephone Encounter (Signed)
Refill done.  

## 2013-11-16 ENCOUNTER — Other Ambulatory Visit: Payer: Self-pay | Admitting: Family Medicine

## 2013-11-16 NOTE — Telephone Encounter (Signed)
Refill done.  

## 2013-12-01 ENCOUNTER — Other Ambulatory Visit: Payer: Self-pay | Admitting: Internal Medicine

## 2013-12-07 ENCOUNTER — Telehealth: Payer: Self-pay | Admitting: *Deleted

## 2013-12-07 MED ORDER — ESOMEPRAZOLE MAGNESIUM 40 MG PO CPDR
40.0000 mg | DELAYED_RELEASE_CAPSULE | Freq: Every day | ORAL | Status: DC
Start: 2013-12-07 — End: 2014-04-26

## 2013-12-07 NOTE — Telephone Encounter (Signed)
Refill done.  

## 2013-12-13 ENCOUNTER — Other Ambulatory Visit: Payer: Self-pay | Admitting: *Deleted

## 2013-12-13 MED ORDER — PROPRANOLOL HCL 20 MG PO TABS
20.0000 mg | ORAL_TABLET | Freq: Two times a day (BID) | ORAL | Status: DC
Start: 2013-12-13 — End: 2014-04-05

## 2013-12-13 MED ORDER — HYDROCHLOROTHIAZIDE 25 MG PO TABS: 25.0000 mg | ORAL_TABLET | Freq: Every day | ORAL | Status: DC

## 2013-12-26 ENCOUNTER — Telehealth: Payer: Self-pay | Admitting: *Deleted

## 2013-12-26 MED ORDER — TRAMADOL HCL 50 MG PO TABS: 50.0000 mg | ORAL_TABLET | Freq: Three times a day (TID) | ORAL | Status: DC | PRN

## 2013-12-26 NOTE — Telephone Encounter (Signed)
Refill done.  

## 2014-02-19 ENCOUNTER — Ambulatory Visit: Payer: Medicare Other | Admitting: Family Medicine

## 2014-02-21 ENCOUNTER — Ambulatory Visit (INDEPENDENT_AMBULATORY_CARE_PROVIDER_SITE_OTHER): Payer: Medicare Other | Admitting: Family Medicine

## 2014-02-21 ENCOUNTER — Encounter: Payer: Self-pay | Admitting: Family Medicine

## 2014-02-21 VITALS — BP 112/76 | HR 75 | Ht 63.0 in | Wt 163.0 lb

## 2014-02-21 DIAGNOSIS — M1710 Unilateral primary osteoarthritis, unspecified knee: Secondary | ICD-10-CM

## 2014-02-21 MED ORDER — TRAMADOL HCL 50 MG PO TABS: 50.0000 mg | ORAL_TABLET | Freq: Three times a day (TID) | ORAL | Status: DC | PRN

## 2014-02-21 NOTE — Progress Notes (Signed)
Pre visit review using our clinic review tool, if applicable. No additional management support is needed unless otherwise documented below in the visit note. 

## 2014-02-21 NOTE — Progress Notes (Signed)
  [facility] [address], [place] [address] Phone: [phone] Subjective:    CC: Bilateral knee pain followup  [id number] Sandy Barrett is a 81 y.o. female coming in with complaint of  Knee pain. She does have severe lateral compartment degenerative changes of the knees bilaterally. Patient was seen last 5 months ago. Patient had injection to the knee greater than 6 months ago.patient finish this series of Synvisc injections back at the end of June. Patient states started having pain again. Has been doing icing, topicals and tried to start walking.  Patient states that this hurts more when going up and down stairs.     Past medical history, social, surgical and family history all reviewed in electronic medical record.   Review of Systems: No headache, visual changes, nausea, vomiting, diarrhea, constipation, dizziness, abdominal pain, skin rash, fevers, chills, night sweats, weight loss, swollen lymph nodes, body aches, joint swelling, muscle aches, chest pain, shortness of breath, mood changes.   Objective Blood pressure 112/76, pulse 75, height 5\' 3"  (1.6 m), weight 163 lb (73.936 kg), SpO2 97 %.  General: No apparent distress alert and oriented x3 mood and affect normal, dressed appropriately.  HEENT: Pupils equal, extraocular movements intact  Respiratory: Patient's speak in full sentences and does not appear short of breath  Cardiovascular: No lower extremity edema, non tender, no erythema  Skin: Warm dry intact with no signs of infection or rash on extremities or on axial skeleton.  Abdomen: Soft nontender  Neuro: Cranial nerves II through XII are intact, neurovascularly intact in all extremities with 2+ DTRs and 2+ pulses.  Lymph: No lymphadenopathy of posterior or anterior cervical chain or axillae bilaterally.  Gait mild antalgic gait.  MSK:  Non tender with full range of motion and good stability and symmetric strength and tone of  shoulders, elbows, wrist, hip (does have replacement on right side), and ankles bilaterally.  Knee: Bilateral knee Normal to inspection but does have significant osteoarthritic changes with mild varus deformity of the right knee compared to left knee. Palpation show  tenderness to palpation over the medial joint line bilaterally. ROM has decreased lacking the last 5 of flexion and extension Ligaments with solid consistent endpoints including ACL, PCL, LCL, MCL. Negative Mcmurray's, Apley's, and Thessalonian tests.  painful patellar compression Patellar glide with moderate crepitus. Patellar and quadriceps tendons unremarkable. Hamstring and quadriceps strength is normal.   After informed written and verbal consent, patient was seated on exam table. Right knee was prepped with alcohol swab and utilizing anterolateral approach, patient's right knee space was injected with 4:1  marcaine 0.5%: Kenalog 40mg /dL. Patient tolerated the procedure well without immediate complications.  After informed written and verbal consent, patient was seated on exam table. Left knee was prepped with alcohol swab and utilizing anterolateral approach, patient's left knee space was injected with 4:1  marcaine 0.5%: Kenalog 40mg /dL. Patient tolerated the procedure well without immediate complications.   Impression and Recommendations:     This case required medical decision making of moderate complexity.

## 2014-02-21 NOTE — Patient Instructions (Signed)
Good to see you as always.  Continue what you are doing and ok to walk Ice is your friend in 6 hours.  See me again in 2-3 weeks and if in pain we will consider orthovisc.

## 2014-02-21 NOTE — Assessment & Plan Note (Signed)
Steroid injections again today. We discussed icing again. Discuss the possible formal physical therapy which patient declined. Patient and will come back and see me again in 3-4 weeks. Continued have pain patient may be a candidate for Orthovisc.   Spent  25 minutes with patient face-to-face and had greater than 50% of counseling including as described above in assessment and plan.

## 2014-03-06 DIAGNOSIS — I2699 Other pulmonary embolism without acute cor pulmonale: Secondary | ICD-10-CM

## 2014-03-06 DIAGNOSIS — I639 Cerebral infarction, unspecified: Secondary | ICD-10-CM

## 2014-03-06 HISTORY — DX: Cerebral infarction, unspecified: I63.9

## 2014-03-06 HISTORY — DX: Other pulmonary embolism without acute cor pulmonale: I26.99

## 2014-03-08 ENCOUNTER — Emergency Department (HOSPITAL_COMMUNITY): Payer: Medicare Other

## 2014-03-08 ENCOUNTER — Encounter (HOSPITAL_COMMUNITY): Payer: Self-pay | Admitting: Emergency Medicine

## 2014-03-08 ENCOUNTER — Inpatient Hospital Stay (HOSPITAL_COMMUNITY): Payer: Medicare Other | Admitting: Certified Registered Nurse Anesthetist

## 2014-03-08 ENCOUNTER — Inpatient Hospital Stay (HOSPITAL_COMMUNITY)
Admission: EM | Admit: 2014-03-08 | Discharge: 2014-03-13 | DRG: 061 | Disposition: A | Discharge status: Rehabilitation Facility | Payer: Medicare Other | Attending: Neurology | Admitting: Neurology

## 2014-03-08 ENCOUNTER — Inpatient Hospital Stay (HOSPITAL_COMMUNITY): Payer: Medicare Other

## 2014-03-08 ENCOUNTER — Encounter (HOSPITAL_COMMUNITY)
Admission: EM | Disposition: A | Discharge status: Rehabilitation Facility | Payer: Self-pay | Source: Home / Self Care | Attending: Neurology

## 2014-03-08 DIAGNOSIS — I82413 Acute embolism and thrombosis of femoral vein, bilateral: Secondary | ICD-10-CM | POA: Diagnosis not present

## 2014-03-08 DIAGNOSIS — J9811 Atelectasis: Secondary | ICD-10-CM | POA: Diagnosis not present

## 2014-03-08 DIAGNOSIS — J9690 Respiratory failure, unspecified, unspecified whether with hypoxia or hypercapnia: Secondary | ICD-10-CM | POA: Diagnosis not present

## 2014-03-08 DIAGNOSIS — J9601 Acute respiratory failure with hypoxia: Secondary | ICD-10-CM | POA: Diagnosis not present

## 2014-03-08 DIAGNOSIS — I69351 Hemiplegia and hemiparesis following cerebral infarction affecting right dominant side: Secondary | ICD-10-CM | POA: Diagnosis not present

## 2014-03-08 DIAGNOSIS — Z96641 Presence of right artificial hip joint: Secondary | ICD-10-CM | POA: Diagnosis present

## 2014-03-08 DIAGNOSIS — Z6829 Body mass index (BMI) 29.0-29.9, adult: Secondary | ICD-10-CM

## 2014-03-08 DIAGNOSIS — J9600 Acute respiratory failure, unspecified whether with hypoxia or hypercapnia: Secondary | ICD-10-CM | POA: Diagnosis not present

## 2014-03-08 DIAGNOSIS — I671 Cerebral aneurysm, nonruptured: Secondary | ICD-10-CM | POA: Diagnosis present

## 2014-03-08 DIAGNOSIS — R748 Abnormal levels of other serum enzymes: Secondary | ICD-10-CM | POA: Diagnosis present

## 2014-03-08 DIAGNOSIS — I63032 Cerebral infarction due to thrombosis of left carotid artery: Secondary | ICD-10-CM | POA: Diagnosis not present

## 2014-03-08 DIAGNOSIS — J90 Pleural effusion, not elsewhere classified: Secondary | ICD-10-CM | POA: Diagnosis not present

## 2014-03-08 DIAGNOSIS — E049 Nontoxic goiter, unspecified: Secondary | ICD-10-CM | POA: Diagnosis present

## 2014-03-08 DIAGNOSIS — I63412 Cerebral infarction due to embolism of left middle cerebral artery: Secondary | ICD-10-CM | POA: Diagnosis not present

## 2014-03-08 DIAGNOSIS — Z79891 Long term (current) use of opiate analgesic: Secondary | ICD-10-CM | POA: Diagnosis not present

## 2014-03-08 DIAGNOSIS — R1310 Dysphagia, unspecified: Secondary | ICD-10-CM | POA: Diagnosis present

## 2014-03-08 DIAGNOSIS — I69320 Aphasia following cerebral infarction: Secondary | ICD-10-CM | POA: Diagnosis not present

## 2014-03-08 DIAGNOSIS — I63311 Cerebral infarction due to thrombosis of right middle cerebral artery: Secondary | ICD-10-CM | POA: Diagnosis not present

## 2014-03-08 DIAGNOSIS — I639 Cerebral infarction, unspecified: Secondary | ICD-10-CM | POA: Diagnosis present

## 2014-03-08 DIAGNOSIS — R9389 Abnormal findings on diagnostic imaging of other specified body structures: Secondary | ICD-10-CM | POA: Diagnosis present

## 2014-03-08 DIAGNOSIS — D6859 Other primary thrombophilia: Secondary | ICD-10-CM | POA: Diagnosis present

## 2014-03-08 DIAGNOSIS — Z79899 Other long term (current) drug therapy: Secondary | ICD-10-CM | POA: Diagnosis not present

## 2014-03-08 DIAGNOSIS — E876 Hypokalemia: Secondary | ICD-10-CM | POA: Diagnosis present

## 2014-03-08 DIAGNOSIS — G8191 Hemiplegia, unspecified affecting right dominant side: Secondary | ICD-10-CM | POA: Diagnosis present

## 2014-03-08 DIAGNOSIS — I63312 Cerebral infarction due to thrombosis of left middle cerebral artery: Secondary | ICD-10-CM

## 2014-03-08 DIAGNOSIS — G8190 Hemiplegia, unspecified affecting unspecified side: Secondary | ICD-10-CM | POA: Diagnosis not present

## 2014-03-08 DIAGNOSIS — M1990 Unspecified osteoarthritis, unspecified site: Secondary | ICD-10-CM | POA: Diagnosis not present

## 2014-03-08 DIAGNOSIS — I82C12 Acute embolism and thrombosis of left internal jugular vein: Secondary | ICD-10-CM | POA: Diagnosis not present

## 2014-03-08 DIAGNOSIS — D093 Carcinoma in situ of thyroid and other endocrine glands: Secondary | ICD-10-CM | POA: Diagnosis present

## 2014-03-08 DIAGNOSIS — I82403 Acute embolism and thrombosis of unspecified deep veins of lower extremity, bilateral: Secondary | ICD-10-CM | POA: Diagnosis not present

## 2014-03-08 DIAGNOSIS — R404 Transient alteration of awareness: Secondary | ICD-10-CM | POA: Diagnosis not present

## 2014-03-08 DIAGNOSIS — Z4682 Encounter for fitting and adjustment of non-vascular catheter: Secondary | ICD-10-CM | POA: Diagnosis not present

## 2014-03-08 DIAGNOSIS — R4701 Aphasia: Secondary | ICD-10-CM | POA: Diagnosis present

## 2014-03-08 DIAGNOSIS — Z791 Long term (current) use of non-steroidal anti-inflammatories (NSAID): Secondary | ICD-10-CM | POA: Diagnosis not present

## 2014-03-08 DIAGNOSIS — M6289 Other specified disorders of muscle: Secondary | ICD-10-CM | POA: Diagnosis not present

## 2014-03-08 DIAGNOSIS — R40243 Glasgow coma scale score 3-8: Secondary | ICD-10-CM | POA: Diagnosis present

## 2014-03-08 DIAGNOSIS — Z90710 Acquired absence of both cervix and uterus: Secondary | ICD-10-CM

## 2014-03-08 DIAGNOSIS — Z86711 Personal history of pulmonary embolism: Secondary | ICD-10-CM | POA: Diagnosis present

## 2014-03-08 DIAGNOSIS — I253 Aneurysm of heart: Secondary | ICD-10-CM | POA: Diagnosis not present

## 2014-03-08 DIAGNOSIS — R531 Weakness: Secondary | ICD-10-CM | POA: Diagnosis not present

## 2014-03-08 DIAGNOSIS — I10 Essential (primary) hypertension: Secondary | ICD-10-CM | POA: Diagnosis not present

## 2014-03-08 DIAGNOSIS — D649 Anemia, unspecified: Secondary | ICD-10-CM | POA: Diagnosis present

## 2014-03-08 DIAGNOSIS — I63511 Cerebral infarction due to unspecified occlusion or stenosis of right middle cerebral artery: Secondary | ICD-10-CM | POA: Diagnosis not present

## 2014-03-08 DIAGNOSIS — M6281 Muscle weakness (generalized): Secondary | ICD-10-CM | POA: Diagnosis not present

## 2014-03-08 DIAGNOSIS — I82433 Acute embolism and thrombosis of popliteal vein, bilateral: Secondary | ICD-10-CM | POA: Diagnosis not present

## 2014-03-08 DIAGNOSIS — I2699 Other pulmonary embolism without acute cor pulmonale: Secondary | ICD-10-CM | POA: Diagnosis not present

## 2014-03-08 DIAGNOSIS — I517 Cardiomegaly: Secondary | ICD-10-CM | POA: Diagnosis not present

## 2014-03-08 DIAGNOSIS — I6602 Occlusion and stenosis of left middle cerebral artery: Secondary | ICD-10-CM | POA: Diagnosis not present

## 2014-03-08 DIAGNOSIS — I679 Cerebrovascular disease, unspecified: Secondary | ICD-10-CM | POA: Diagnosis not present

## 2014-03-08 DIAGNOSIS — I6340 Cerebral infarction due to embolism of unspecified cerebral artery: Secondary | ICD-10-CM | POA: Diagnosis not present

## 2014-03-08 DIAGNOSIS — J811 Chronic pulmonary edema: Secondary | ICD-10-CM | POA: Diagnosis not present

## 2014-03-08 DIAGNOSIS — E669 Obesity, unspecified: Secondary | ICD-10-CM | POA: Diagnosis present

## 2014-03-08 DIAGNOSIS — I63512 Cerebral infarction due to unspecified occlusion or stenosis of left middle cerebral artery: Secondary | ICD-10-CM | POA: Diagnosis not present

## 2014-03-08 DIAGNOSIS — R938 Abnormal findings on diagnostic imaging of other specified body structures: Secondary | ICD-10-CM | POA: Diagnosis not present

## 2014-03-08 DIAGNOSIS — N390 Urinary tract infection, site not specified: Secondary | ICD-10-CM | POA: Diagnosis not present

## 2014-03-08 HISTORY — PX: RADIOLOGY WITH ANESTHESIA: SHX6223

## 2014-03-08 LAB — COMPREHENSIVE METABOLIC PANEL
ALT: 18 U/L (ref 0–35)
AST: 50 U/L — ABNORMAL HIGH (ref 0–37)
Albumin: 3.3 g/dL — ABNORMAL LOW (ref 3.5–5.2)
Alkaline Phosphatase: 63 U/L (ref 39–117)
Anion gap: 7 (ref 5–15)
BUN: 13 mg/dL (ref 6–23)
CO2: 31 mmol/L (ref 19–32)
Calcium: 9 mg/dL (ref 8.4–10.5)
Chloride: 105 mmol/L (ref 96–112)
Creatinine, Ser: 0.76 mg/dL (ref 0.50–1.10)
GFR calc Af Amer: >90 mL/min (ref >90)
GFR calc non Af Amer: 78 mL/min — ABNORMAL LOW (ref >90)
Glucose, Bld: 123 mg/dL — ABNORMAL HIGH (ref 70–99)
Potassium: 5.1 mmol/L (ref 3.5–5.1)
Sodium: 143 mmol/L (ref 135–145)
Total Bilirubin: 1.8 mg/dL — ABNORMAL HIGH (ref 0.3–1.2)
Total Protein: 6.4 g/dL (ref 6.0–8.3)

## 2014-03-08 LAB — ETHANOL: Alcohol, Ethyl (B): <5 mg/dL (ref 0–9)

## 2014-03-08 LAB — I-STAT CHEM 8, ED
BUN: 12 mg/dL (ref 6–23)
BUN: 16 mg/dL (ref 6–23)
Calcium, Ion: 1.15 mmol/L (ref 1.13–1.30)
Calcium, Ion: 1.2 mmol/L (ref 1.13–1.30)
Chloride: 101 mmol/L (ref 96–112)
Chloride: 103 mmol/L (ref 96–112)
Creatinine, Ser: 0.7 mg/dL (ref 0.50–1.10)
Creatinine, Ser: 0.7 mg/dL (ref 0.50–1.10)
Glucose, Bld: 116 mg/dL — ABNORMAL HIGH (ref 70–99)
Glucose, Bld: 120 mg/dL — ABNORMAL HIGH (ref 70–99)
HCT: 43 % (ref 36.0–46.0)
HCT: 44 % (ref 36.0–46.0)
Hemoglobin: 14.6 g/dL (ref 12.0–15.0)
Hemoglobin: 15 g/dL (ref 12.0–15.0)
Potassium: 3.4 mmol/L — ABNORMAL LOW (ref 3.5–5.1)
Potassium: 4.7 mmol/L (ref 3.5–5.1)
Sodium: 142 mmol/L (ref 135–145)
Sodium: 143 mmol/L (ref 135–145)
TCO2: 25 mmol/L (ref 0–100)
TCO2: 28 mmol/L (ref 0–100)

## 2014-03-08 LAB — URINALYSIS, ROUTINE W REFLEX MICROSCOPIC
Bilirubin Urine: NEGATIVE
Glucose, UA: NEGATIVE mg/dL
Hgb urine dipstick: NEGATIVE
Ketones, ur: NEGATIVE mg/dL
Nitrite: POSITIVE — AB
Protein, ur: NEGATIVE mg/dL
Specific Gravity, Urine: 1.017 (ref 1.005–1.030)
Urobilinogen, UA: 0.2 mg/dL (ref 0.0–1.0)
pH: 7 (ref 5.0–8.0)

## 2014-03-08 LAB — CBC
HCT: 40.4 % (ref 36.0–46.0)
HCT: 34.8 % — ABNORMAL LOW (ref 36.0–46.0)
Hemoglobin: 12.9 g/dL (ref 12.0–15.0)
Hemoglobin: 11.3 g/dL — ABNORMAL LOW (ref 12.0–15.0)
MCH: 30.2 pg (ref 26.0–34.0)
MCH: 29.6 pg (ref 26.0–34.0)
MCHC: 31.9 g/dL (ref 30.0–36.0)
MCHC: 32.5 g/dL (ref 30.0–36.0)
MCV: 94.6 fL (ref 78.0–100.0)
MCV: 91.1 fL (ref 78.0–100.0)
Platelets: 409 10*3/uL — ABNORMAL HIGH (ref 150–400)
Platelets: 300 10*3/uL (ref 150–400)
RBC: 4.27 MIL/uL (ref 3.87–5.11)
RBC: 3.82 MIL/uL — ABNORMAL LOW (ref 3.87–5.11)
RDW: 16.3 % — ABNORMAL HIGH (ref 11.5–15.5)
RDW: 16 % — ABNORMAL HIGH (ref 11.5–15.5)
WBC: 8.7 10*3/uL (ref 4.0–10.5)
WBC: 8.5 10*3/uL (ref 4.0–10.5)

## 2014-03-08 LAB — RAPID URINE DRUG SCREEN, HOSP PERFORMED
Amphetamines: NOT DETECTED
Barbiturates: NOT DETECTED
Benzodiazepines: NOT DETECTED
Cocaine: NOT DETECTED
Opiates: NOT DETECTED
Tetrahydrocannabinol: NOT DETECTED

## 2014-03-08 LAB — DIFFERENTIAL
Basophils Absolute: 0.1 10*3/uL (ref 0.0–0.1)
Basophils Relative: 1 % (ref 0–1)
Eosinophils Absolute: 0.2 10*3/uL (ref 0.0–0.7)
Eosinophils Relative: 2 % (ref 0–5)
Lymphocytes Relative: 10 % — ABNORMAL LOW (ref 12–46)
Lymphs Abs: 0.9 10*3/uL (ref 0.7–4.0)
Monocytes Absolute: 1 10*3/uL (ref 0.1–1.0)
Monocytes Relative: 11 % (ref 3–12)
Neutro Abs: 6.6 10*3/uL (ref 1.7–7.7)
Neutrophils Relative %: 76 % (ref 43–77)

## 2014-03-08 LAB — BLOOD GAS, ARTERIAL
Acid-Base Excess: 0.6 mmol/L (ref 0.0–2.0)
Bicarbonate: 24.7 mEq/L — ABNORMAL HIGH (ref 20.0–24.0)
FIO2: 0.5 %
MECHVT: 420 mL
O2 Saturation: 99.4 %
PEEP: 5 cmH2O
Patient temperature: 98.6
RATE: 14 resp/min
TCO2: 25.9 mmol/L (ref 0–100)
pCO2 arterial: 39.6 mmHg (ref 35.0–45.0)
pH, Arterial: 7.411 (ref 7.350–7.450)
pO2, Arterial: 160 mmHg — ABNORMAL HIGH (ref 80.0–100.0)

## 2014-03-08 LAB — I-STAT TROPONIN, ED
Troponin i, poc: 0.6 ng/mL (ref 0.00–0.08)
Troponin i, poc: 0.71 ng/mL (ref 0.00–0.08)

## 2014-03-08 LAB — MRSA PCR SCREENING: MRSA by PCR: NEGATIVE

## 2014-03-08 LAB — GLUCOSE, CAPILLARY
Glucose-Capillary: 103 mg/dL — ABNORMAL HIGH (ref 70–99)
Glucose-Capillary: 90 mg/dL (ref 70–99)

## 2014-03-08 LAB — BASIC METABOLIC PANEL
Anion gap: 6 (ref 5–15)
BUN: 8 mg/dL (ref 6–23)
CO2: 29 mmol/L (ref 19–32)
Calcium: 8.4 mg/dL (ref 8.4–10.5)
Chloride: 106 mmol/L (ref 96–112)
Creatinine, Ser: 0.55 mg/dL (ref 0.50–1.10)
GFR calc Af Amer: >90 mL/min (ref >90)
GFR calc non Af Amer: 87 mL/min — ABNORMAL LOW (ref >90)
Glucose, Bld: 143 mg/dL — ABNORMAL HIGH (ref 70–99)
Potassium: 3 mmol/L — ABNORMAL LOW (ref 3.5–5.1)
Sodium: 141 mmol/L (ref 135–145)

## 2014-03-08 LAB — APTT: aPTT: 32 seconds (ref 24–37)

## 2014-03-08 LAB — CBG MONITORING, ED
Glucose-Capillary: 106 mg/dL — ABNORMAL HIGH (ref 70–99)
Glucose-Capillary: 116 mg/dL — ABNORMAL HIGH (ref 70–99)

## 2014-03-08 LAB — PROTIME-INR
INR: 1.18 (ref 0.00–1.49)
Prothrombin Time: 15.1 seconds (ref 11.6–15.2)

## 2014-03-08 LAB — URINE MICROSCOPIC-ADD ON

## 2014-03-08 SURGERY — RADIOLOGY WITH ANESTHESIA
Anesthesia: General | Laterality: Left

## 2014-03-08 MED ORDER — FENTANYL BOLUS VIA INFUSION
25.0000 ug | INTRAVENOUS | Status: DC | PRN
  Filled 2014-03-08: qty 25

## 2014-03-08 MED ORDER — DEXTROSE 5 % IV SOLN
1.0000 g | INTRAVENOUS | Status: DC | PRN
  Administered 2014-03-08: 1 g via INTRAVENOUS

## 2014-03-08 MED ORDER — SENNOSIDES-DOCUSATE SODIUM 8.6-50 MG PO TABS
1.0000 | ORAL_TABLET | Freq: Every evening | ORAL | Status: DC | PRN
  Filled 2014-03-08: qty 1

## 2014-03-08 MED ORDER — PROPRANOLOL HCL 20 MG PO TABS
20.0000 mg | ORAL_TABLET | Freq: Two times a day (BID) | ORAL | Status: DC
  Filled 2014-03-08 (×3): qty 1

## 2014-03-08 MED ORDER — ONDANSETRON HCL 4 MG/2ML IJ SOLN
INTRAMUSCULAR | Status: DC | PRN
  Administered 2014-03-08: 4 mg via INTRAVENOUS

## 2014-03-08 MED ORDER — EPHEDRINE SULFATE 50 MG/ML IJ SOLN
INTRAMUSCULAR | Status: DC | PRN
  Administered 2014-03-08 (×3): 10 mg via INTRAVENOUS

## 2014-03-08 MED ORDER — PHENYLEPHRINE HCL 10 MG/ML IJ SOLN
10.0000 mg | INTRAVENOUS | Status: DC | PRN
  Administered 2014-03-08: 10 ug/min via INTRAVENOUS

## 2014-03-08 MED ORDER — LIDOCAINE HCL 1 % IJ SOLN
INTRAMUSCULAR | Status: AC
  Filled 2014-03-08: qty 20

## 2014-03-08 MED ORDER — SODIUM CHLORIDE 0.9 % IV SOLN
INTRAVENOUS | Status: DC
  Administered 2014-03-10 (×2): via INTRAVENOUS

## 2014-03-08 MED ORDER — NITROGLYCERIN 1 MG/10 ML FOR IR/CATH LAB
INTRA_ARTERIAL | Status: AC
  Filled 2014-03-08: qty 10

## 2014-03-08 MED ORDER — LABETALOL HCL 5 MG/ML IV SOLN
10.0000 mg | INTRAVENOUS | Status: DC | PRN
Start: 2014-03-08 — End: 2014-03-13

## 2014-03-08 MED ORDER — PANTOPRAZOLE SODIUM 40 MG IV SOLR: 40.0000 mg | Freq: Every day | INTRAVENOUS | Status: DC

## 2014-03-08 MED ORDER — SODIUM CHLORIDE 0.9 % IV SOLN
INTRAVENOUS | Status: DC | PRN
  Administered 2014-03-08: 17:00:00 via INTRAVENOUS

## 2014-03-08 MED ORDER — ACETAMINOPHEN 650 MG RE SUPP: 650.0000 mg | RECTAL | Status: DC | PRN

## 2014-03-08 MED ORDER — FENTANYL CITRATE 0.05 MG/ML IJ SOLN: 50.0000 ug | Freq: Once | INTRAMUSCULAR | Status: DC

## 2014-03-08 MED ORDER — HYDROCHLOROTHIAZIDE 25 MG PO TABS
25.0000 mg | ORAL_TABLET | Freq: Every day | ORAL | Status: DC
  Filled 2014-03-08: qty 1

## 2014-03-08 MED ORDER — SODIUM CHLORIDE 0.9 % IV SOLN: INTRAVENOUS | Status: DC

## 2014-03-08 MED ORDER — PROPOFOL INFUSION 10 MG/ML OPTIME
INTRAVENOUS | Status: DC | PRN
  Administered 2014-03-08: 20 ug/kg/min via INTRAVENOUS

## 2014-03-08 MED ORDER — FENTANYL CITRATE 0.05 MG/ML IJ SOLN
INTRAMUSCULAR | Status: DC | PRN
  Administered 2014-03-08: 50 ug via INTRAVENOUS

## 2014-03-08 MED ORDER — PANTOPRAZOLE SODIUM 40 MG IV SOLR
40.0000 mg | Freq: Every day | INTRAVENOUS | Status: DC
  Administered 2014-03-08: 40 mg via INTRAVENOUS
  Filled 2014-03-08 (×2): qty 40

## 2014-03-08 MED ORDER — DEXTROSE 5 % IV SOLN
1.0000 g | Freq: Once | INTRAVENOUS | Status: AC
  Administered 2014-03-08: 1 g via INTRAVENOUS
  Filled 2014-03-08: qty 10

## 2014-03-08 MED ORDER — SUCCINYLCHOLINE CHLORIDE 20 MG/ML IJ SOLN
INTRAMUSCULAR | Status: DC | PRN
  Administered 2014-03-08: 100 mg via INTRAVENOUS

## 2014-03-08 MED ORDER — ROCURONIUM BROMIDE 100 MG/10ML IV SOLN
INTRAVENOUS | Status: DC | PRN
  Administered 2014-03-08: 20 mg via INTRAVENOUS
  Administered 2014-03-08 (×2): 10 mg via INTRAVENOUS
  Administered 2014-03-08: 50 mg via INTRAVENOUS

## 2014-03-08 MED ORDER — PHENYLEPHRINE HCL 10 MG/ML IJ SOLN: 10.0000 mg | INTRAVENOUS | Status: DC | PRN

## 2014-03-08 MED ORDER — STROKE: EARLY STAGES OF RECOVERY BOOK
Freq: Once | Status: AC
  Administered 2014-03-08: 21:00:00
  Filled 2014-03-08: qty 1

## 2014-03-08 MED ORDER — IOHEXOL 350 MG/ML SOLN
50.0000 mL | Freq: Once | INTRAVENOUS | Status: AC | PRN
  Administered 2014-03-08: 50 mL via INTRAVENOUS

## 2014-03-08 MED ORDER — FENTANYL CITRATE 0.05 MG/ML IJ SOLN
INTRAMUSCULAR | Status: AC
  Filled 2014-03-08: qty 5

## 2014-03-08 MED ORDER — ALTEPLASE (STROKE) FULL DOSE INFUSION
67.0000 mg | Freq: Once | INTRAVENOUS | Status: AC
  Administered 2014-03-08: 67 mg via INTRAVENOUS
  Filled 2014-03-08: qty 67

## 2014-03-08 MED ORDER — ALTEPLASE 30 MG/30 ML FOR INTERV. RAD
1.0000 mg | INTRA_ARTERIAL | Status: AC | PRN
  Filled 2014-03-08: qty 30

## 2014-03-08 MED ORDER — PROPOFOL 10 MG/ML IV BOLUS
INTRAVENOUS | Status: DC | PRN
  Administered 2014-03-08: 90 mg via INTRAVENOUS
  Administered 2014-03-08: 30 mg via INTRAVENOUS

## 2014-03-08 MED ORDER — SODIUM CHLORIDE 0.9 % IV SOLN: 250.0000 mL | INTRAVENOUS | Status: DC | PRN

## 2014-03-08 MED ORDER — SODIUM CHLORIDE 0.9 % IV SOLN
25.0000 ug/h | INTRAVENOUS | Status: DC
  Administered 2014-03-08: 50 ug/h via INTRAVENOUS
  Filled 2014-03-08: qty 50

## 2014-03-08 MED ORDER — IOHEXOL 350 MG/ML SOLN: 80.0000 mL | Freq: Once | INTRAVENOUS | Status: AC | PRN

## 2014-03-08 MED ORDER — IOHEXOL 300 MG/ML  SOLN
150.0000 mL | Freq: Once | INTRAMUSCULAR | Status: AC | PRN
  Administered 2014-03-08: 100 mL via INTRAVENOUS

## 2014-03-08 MED ORDER — FAMOTIDINE IN NACL 20-0.9 MG/50ML-% IV SOLN
20.0000 mg | Freq: Two times a day (BID) | INTRAVENOUS | Status: DC
  Administered 2014-03-08: 20 mg via INTRAVENOUS
  Filled 2014-03-08 (×3): qty 50

## 2014-03-08 MED ORDER — ACETAMINOPHEN 325 MG PO TABS: 650.0000 mg | ORAL_TABLET | ORAL | Status: DC | PRN

## 2014-03-08 MED ORDER — LACTATED RINGERS IV SOLN
INTRAVENOUS | Status: DC | PRN
  Administered 2014-03-08: 17:00:00 via INTRAVENOUS

## 2014-03-08 MED ORDER — CETYLPYRIDINIUM CHLORIDE 0.05 % MT LIQD
7.0000 mL | Freq: Four times a day (QID) | OROMUCOSAL | Status: DC
  Administered 2014-03-09 – 2014-03-11 (×12): 7 mL via OROMUCOSAL

## 2014-03-08 MED ORDER — CHLORHEXIDINE GLUCONATE 0.12 % MT SOLN
15.0000 mL | Freq: Two times a day (BID) | OROMUCOSAL | Status: DC
Start: 2014-03-08 — End: 2014-03-11
  Administered 2014-03-08 – 2014-03-11 (×7): 15 mL via OROMUCOSAL
  Filled 2014-03-08 (×6): qty 15

## 2014-03-08 NOTE — ED Notes (Signed)
Pt BIB daughter.  Was at hair salon when she started having rt sided facial droop and aphasia.

## 2014-03-08 NOTE — ED Notes (Signed)
Stroke Swallow not completed, Stroke team Janett Billow, RN informed pts RN to wait, as pt may improve after IR.  44M aware.

## 2014-03-08 NOTE — ED Provider Notes (Signed)
Angiocath insertion Performed by: Duffy Bruce  Consent: Verbal consent obtained. Risks and benefits: risks, benefits and alternatives were discussed Time out: Immediately prior to procedure a "time out" was called to verify the correct patient, procedure, equipment, support staff and site/side marked as required.  Preparation: Patient was prepped and draped in the usual sterile fashion.  Vein Location: Right forearm  Ultrasound Guided  Gauge: 14  Normal blood return and flush without difficulty Patient tolerance: Patient tolerated the procedure well with no immediate complications.     Duffy Bruce, MD 03/08/14 [place]a, MD 03/08/14 [phone]

## 2014-03-08 NOTE — Transfer of Care (Signed)
Immediate Anesthesia Transfer of Care Note  Patient: Sandy Barrett  Procedure(s) Performed: Procedure(s): RADIOLOGY WITH ANESTHESIA (Left)  Patient Location: ICU  Anesthesia Type:General  Level of Consciousness: Patient remains intubated per anesthesia plan  Airway & Oxygen Therapy: Patient remains intubated per anesthesia plan  Post-op Assessment: Report given to RN and Post -op Vital signs reviewed and stable  Post vital signs: Reviewed and stable  Last Vitals:  Filed Vitals:   03/08/14 1733  BP: 117/65  Pulse:   Temp: 36.6 C  Resp: 17    Complications: No apparent anesthesia complications

## 2014-03-08 NOTE — ED Notes (Signed)
Patient transported to CT 

## 2014-03-08 NOTE — ED Notes (Addendum)
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 Pt hypertensive. Pt has facial droops, unable to follow commands, unable to speak. Unable to complete swallow screen or NIH.

## 2014-03-08 NOTE — ED Notes (Signed)
NOTIFIED DR. RANCOUR FOR PATIENTS LAB RESULTS OF I-STAT TROPONIN @16 :00PM ,03/08/2014.

## 2014-03-08 NOTE — ED Notes (Signed)
Critical Care MD at bedside.  

## 2014-03-08 NOTE — ED Provider Notes (Signed)
CSN: [id number]     Arrival date & time 03/08/14  1448 History   First MD Initiated Contact with Patient 03/08/14 1453     Chief Complaint  Patient presents with  . Code Stroke     (Consider location/radiation/quality/duration/timing/severity/associated sxs/prior Treatment) HPI Comments: Patient transferred as code stroke from [facility]. Patient developed specific aphasia and right-sided weakness that began around 1:30 PM. Daughter last saw her normal yesterday. Patient noted to be confused when she arrived at her hairdresser. CT scan performed at [facility] shows no hemorrhage but left sided MCA sign. Neurology team and Dr. Nicole Kindred at bedside on arrival.  The history is provided by the patient and the EMS personnel. The history is limited by the condition of the patient.    Past Medical History  Diagnosis Date  . HYPERTENSION   . HYPERTHYROIDISM   . OBESITY   . ARTHRITIS   . Posttraumatic stress disorder   . INSOMNIA   . MIGRAINE HEADACHE   . Hyperthyroidism     s/p I-131 ablation 03/2011 of multinod goiter  . Arthritis    Past Surgical History  Procedure Laterality Date  . Abdominal hysterectomy  1976  . Total hip arthroplasty  1998     right  . Tonsillectomy    . Breast surgery      biopsy   Family History  Problem Relation Age of Onset  . Asthma Mother   . Asthma Father   . Prostate cancer Father    History  Substance Use Topics  . Smoking status: Never Smoker   . Smokeless tobacco: Not on file  . Alcohol Use: No   OB History    No data available     Review of Systems  Unable to perform ROS: Acuity of condition      Allergies  Review of patient's allergies indicates no known allergies.  Home Medications   Prior to Admission medications   Medication Sig Start Date End Date Taking? Authorizing Provider  butalbital-acetaminophen-caffeine (FIORICET, ESGIC) 50-325-40 MG per tablet TAKE 1 TABLET BY MOUTH TWICE A DAY AS NEEDED FOR  HEADACHE   Yes Rowe Clack, MD  diclofenac sodium (VOLTAREN) 1 % GEL Apply 2 g topically 3 (three) times daily. To affected joint. 10/03/13  Yes Lyndal Pulley, DO  esomeprazole (NEXIUM) 40 MG capsule Take 1 capsule (40 mg total) by mouth daily. 12/07/13  Yes Lyndal Pulley, DO  hydrochlorothiazide (HYDRODIURIL) 25 MG tablet Take 1 tablet (25 mg total) by mouth daily. 12/13/13  Yes Rowe Clack, MD  meloxicam (MOBIC) 7.5 MG tablet Take 1 tablet (7.5 mg total) by mouth daily. 08/07/13  Yes Rowe Clack, MD  polyethylene glycol powder (GLYCOLAX/MIRALAX) powder TAKE 17 G BY MOUTH DAILY.   Yes Rowe Clack, MD  Probiotic Product (ALIGN) 4 MG CAPS Take by mouth daily.     Yes Historical Provider, MD  propranolol (INDERAL) 20 MG tablet Take 1 tablet (20 mg total) by mouth 2 (two) times daily. 12/13/13  Yes Rowe Clack, MD  traMADol (ULTRAM) 50 MG tablet Take 1 tablet (50 mg total) by mouth 3 (three) times daily as needed. 02/21/14  Yes Lyndal Pulley, DO  traZODone (DESYREL) 50 MG tablet Take 1 tablet (50 mg total) by mouth at bedtime as needed for sleep. 05/02/12  Yes Rowe Clack, MD   BP 99/61 mmHg  Pulse 70  Temp(Src) 98.1 F (36.7 C) (Axillary)  Resp 17  Ht  5\' 5"  (1.651 m)  Wt 174 lb 6.1 oz (79.1 kg)  BMI 29.02 kg/m2  SpO2 100% Physical Exam  Constitutional: She appears well-developed and well-nourished. No distress.  HENT:  Head: Normocephalic and atraumatic.  Mouth/Throat: Oropharynx is clear and moist. No oropharyngeal exudate.  Eyes: Conjunctivae and EOM are normal. Pupils are equal, round, and reactive to light.  Neck: Normal range of motion. Neck supple.  Cardiovascular: Normal rate, regular rhythm and normal heart sounds.   Pulmonary/Chest: Effort normal and breath sounds normal. No respiratory distress.  Abdominal: Soft. There is no tenderness. There is no rebound and no guarding.  Musculoskeletal: Normal range of motion. She exhibits no edema or  tenderness.  Neurological: She is alert. A cranial nerve deficit is present.  Right-sided facial droop, expressive aphasia, flaccidity of right side.  Skin: Skin is warm.    ED Course  Procedures (including critical care time) Labs Review Labs Reviewed  CBC - Abnormal; Notable for the following:    RDW 16.3 (*)    Platelets 409 (*)    All other components within normal limits  DIFFERENTIAL - Abnormal; Notable for the following:    Lymphocytes Relative 10 (*)    All other components within normal limits  COMPREHENSIVE METABOLIC PANEL - Abnormal; Notable for the following:    Glucose, Bld 123 (*)    Albumin 3.3 (*)    AST 50 (*)    Total Bilirubin 1.8 (*)    GFR calc non Af Amer 78 (*)    All other components within normal limits  URINALYSIS, ROUTINE W REFLEX MICROSCOPIC - Abnormal; Notable for the following:    Nitrite POSITIVE (*)    Leukocytes, UA SMALL (*)    All other components within normal limits  URINE MICROSCOPIC-ADD ON - Abnormal; Notable for the following:    Bacteria, UA MANY (*)    All other components within normal limits  BASIC METABOLIC PANEL - Abnormal; Notable for the following:    Potassium 3.0 (*)    Glucose, Bld 143 (*)    GFR calc non Af Amer 87 (*)    All other components within normal limits  CBC - Abnormal; Notable for the following:    RBC 3.82 (*)    Hemoglobin 11.3 (*)    HCT 34.8 (*)    RDW 16.0 (*)    All other components within normal limits  BLOOD GAS, ARTERIAL - Abnormal; Notable for the following:    pO2, Arterial 160.0 (*)    Bicarbonate 24.7 (*)    All other components within normal limits  GLUCOSE, CAPILLARY - Abnormal; Notable for the following:    Glucose-Capillary 103 (*)    All other components within normal limits  CBG MONITORING, ED - Abnormal; Notable for the following:    Glucose-Capillary 116 (*)    All other components within normal limits  I-STAT TROPOININ, ED - Abnormal; Notable for the following:    Troponin i, poc  0.71 (*)    All other components within normal limits  CBG MONITORING, ED - Abnormal; Notable for the following:    Glucose-Capillary 106 (*)    All other components within normal limits  I-STAT CHEM 8, ED - Abnormal; Notable for the following:    Potassium 3.4 (*)    Glucose, Bld 116 (*)    All other components within normal limits  I-STAT TROPOININ, ED - Abnormal; Notable for the following:    Troponin i, poc 0.60 (*)    All  other components within normal limits  I-STAT CHEM 8, ED - Abnormal; Notable for the following:    Glucose, Bld 120 (*)    All other components within normal limits  MRSA PCR SCREENING  PROTIME-INR  APTT  ETHANOL  URINE RAPID DRUG SCREEN (HOSP PERFORMED)  CBC  BASIC METABOLIC PANEL  MAGNESIUM  PHOSPHORUS  HEMOGLOBIN A1C  LIPID PANEL  ANTITHROMBIN III  PROTEIN C ACTIVITY  PROTEIN C, TOTAL  PROTEIN S ACTIVITY  PROTEIN S, TOTAL  LUPUS ANTICOAGULANT PANEL  BETA-2-GLYCOPROTEIN I ABS, IGG/M/A  HOMOCYSTEINE  FACTOR 5 LEIDEN  PROTHROMBIN GENE MUTATION  CARDIOLIPIN ANTIBODIES, IGG, IGM, IGA  I-STAT TROPOININ, ED  I-STAT TROPOININ, ED    Imaging Review Ct Head Wo Contrast  03/08/2014   CLINICAL DATA:  Acute onset right-sided facial droop and aphasia  EXAM: CT HEAD WITHOUT CONTRAST  TECHNIQUE: Contiguous axial images were obtained from the base of the skull through the vertex without intravenous contrast.  COMPARISON:  November 13, 2012  FINDINGS: The ventricles are normal in size and configuration. There is no intracranial mass, hemorrhage, extra-axial fluid collection, or midline shift. The gray and white compartments appear normal. No acute infarct is apparent.  There is increased attenuation in the left middle cerebral artery compared to prior study and compared to the right side.  The bony calvarium appears intact.  The mastoid air cells are clear.  IMPRESSION: Increased attenuation in the left middle cerebral artery. Early thrombosis in this vessel must be  of concern given this appearance. Elsewhere gray-white compartments appear normal. No hemorrhage or mass effect.  Critical Value/emergent results were called by telephone at the time of interpretation on 03/08/2014 at 3:06 pm to Dr. Lacretia Leigh , who verbally acknowledged these results.   Electronically Signed   By: Lowella Grip III M.D.   On: 03/08/2014 15:09   Ct Cerebral Perfusion W/cm  03/08/2014   CLINICAL DATA:  Code stroke.  EXAM: CT CEREBRAL PERFUSION WITH CONTRAST  CONTRAST:  60mL OMNIPAQUE IOHEXOL 350 MG/ML SOLN  COMPARISON:  CT head 03/08/2014  FINDINGS: Left cavernous carotid is occluded. Left M1 segment occluded. Decreased perfusion left MCA territory.  Right carotid and right M1 segment are patent.  Basilar is patent.  CT perfusion reveals significantly increased mean transit time throughout the left temporal and parietal lobe. The insula is involved as well as the caudate. Cerebral blood volume not significantly decreased in the left MCA territory. Cerebral blood flow is decreased in the left MCA territory.  Posterior circulation normal.  Right MCA circulation normal  IMPRESSION: Occlusion left internal carotid artery and left M1 segment.  CT perfusion reveals increased mean transit time in the left MCA territory without significant decrease in cerebral blood volume. Findings suggest a large area of severe ischemia and penumbra without definite core infarct at this time.  Critical Value/emergent results were called by telephone at the time of interpretation on 03/08/2014 at 4:22 pm to Dr. Wallie Char, who verbally acknowledged these results.   Electronically Signed   By: Franchot Gallo M.D.   On: 03/08/2014 16:28   Dg Chest Port 1 View  03/08/2014   CLINICAL DATA:  Intubation  EXAM: PORTABLE CHEST - 1 VIEW  COMPARISON:  03/08/2014  FINDINGS: Interval tracheal intubation with tip at the level of the mid thoracic trachea. As before, there is marked rightward displacement of the trachea from a  large goiter. A nasogastric tube enters the stomach, also distorted by large goiter.  Stable cardiomegaly. No  appreciable change in aortic contours. Stable prominent vessels compatible with venous congestion. There is also mild scattered dependent atelectasis. No effusion or air leak.  IMPRESSION: 1. Endotracheal and orogastric tubes are in good position. 2. Tracheal and esophageal displacement from goiter. 3. Stable aeration.   Electronically Signed   By: Monte Fantasia M.D.   On: 03/08/2014 21:26   Dg Chest Portable 1 View  03/08/2014   CLINICAL DATA:  Right-sided facial droop  EXAM: PORTABLE CHEST - 1 VIEW  COMPARISON:  12/12/2010  FINDINGS: Cardiac shadow is stable and enlarged. Intrathoracic goiter is again identified and stable with deviation of the trachea to the right. The lungs are well aerated bilaterally. Mild vascular congestion is noted. No focal confluent infiltrate is seen. Calcifications are noted in the left axilla.  IMPRESSION: Stable intrathoracic goiter on the left with tracheal deviation.  Increased vascular congestion.   Electronically Signed   By: Inez Catalina M.D.   On: 03/08/2014 15:45   Ct Angio Chest Aorta W/cm &/or Wo/cm  03/08/2014   CLINICAL DATA:  Right-sided weakness  EXAM: CT ANGIOGRAPHY CHEST WITH CONTRAST  TECHNIQUE: Multidetector CT imaging of the chest was performed using the standard protocol during bolus administration of intravenous contrast. Multiplanar CT image reconstructions and MIPs were obtained to evaluate the vascular anatomy.  CONTRAST:  100 mL Omnipaque 350  COMPARISON:  12/18/2010  FINDINGS: The lungs demonstrate some mild dependent atelectatic changes bilaterally. No focal confluent infiltrate is seen.  The thoracic inlet demonstrates significant enlargement of the thyroid gland particularly on the left with displacement of the trachea to the right. The overall appearance is stable from the prior CT examination of 2012. The dimensions of the intrathoracic  goiter are stable. There are changes suggestive of thrombus within the left internal jugular vein. There is also poor opacification of the central portion of the right internal jugular vein with opacification superiorly which may also represent a degree of thrombosis. Multiple chest wall collaterals are noted with recruitment of the azygos vein for drainage to the superior vena cava due to mass effect from the goiter.  The thoracic aorta is patent without aneurysmal dilatation. No dissection is seen. The branches of the thoracic aorta are within normal limits although splayed by the intrathoracic goiter. No areas of narrowing are seen. The visualized portions of the carotid arteries bilaterally are patent. The carotid bifurcation on the left is patent.  The pulmonary artery demonstrates central prominence with evidence of significant bilateral pulmonary emboli. These are scattered throughout the upper and lower lobes bilaterally. The right ventricle to left ventricle ratio is 0.9. No left atrial or right atrial thrombus is noted.  Scanning into the upper abdomen reveals cholelithiasis without complicating factors. Left para pelvic cysts are noted. The osseous structures show diffuse degenerative changes of the thoracic spine.  Review of the MIP images confirms the above findings.  IMPRESSION: Positive for acute PE with CT evidence of right heart strain (RV/LV Ratio = 0.9) consistent with at least submassive (intermediate risk)PE. The presence of right heart strain has been associated with an increased risk of morbidity and mortality. Consultation with Pulmonary and Critical Care Medicine is recommended. The patient is concurrently receiving systemic tPA for a cerebral arterial thrombus as per the emergency room physician.  Large left intrathoracic goiter with displacement of the trachea, aortic branches and superior vena cava. Some chest wall collaterals are noted secondary to the effect on the superior vena cava.   Changes consistent with left internal  jugular vein thrombus likely related extrinsic compression on the vein by the goiter. There is some suggestion of right jugular venous thrombosis as well.  Critical Value/emergent results were called by telephone at the time of interpretation on 03/08/2014 at 4:52 pm to Dr. Ezequiel Essex , who verbally acknowledged these results.   Electronically Signed   By: Inez Catalina M.D.   On: 03/08/2014 16:52     EKG Interpretation   Date/Time:  Thursday March 08 2014 15:12:18 EST Ventricular Rate:  77 PR Interval:  180 QRS Duration: 69 QT Interval:  365 QTC Calculation: 413 R Axis:   54 Text Interpretation:  Sinus rhythm Probable left atrial enlargement  Borderline T abnormalities, anterior leads No significant change was found  Confirmed by Carson [id number]) on 03/08/2014 3:24:45 PM      MDM   Final diagnoses:  Cerebral infarction due to unspecified mechanism  Right sided weakness   Code stroke from [place] with MCA sign. it is unclear when patient was last seen normal. Her hairdresser reported she was confused on arrival but did drive herself to the salon. Daughter last saw her normal yesterday.   MCA sign on CT scan.  After multiple phone calls with family. Dr. Nicole Kindred has determined last normal was reliably at noon when patient was normal on the telephone. Family reports that patient was normal at noon when she was spoken to on the phone.  Dr. Nicole Kindred planning systemic TPA. Patient with elevated troponin. Wide mediastinum on chest x-ray with known intrathoracic goiter. Patient unable to say with she's having chest pain. We'll evaluate for dissection prior to TPA administration.  No aortic dissection but bilateral PE with large clot burden. R heart strain on CT. Patient with large thyroid mass which is possibly malignant. Also thrombus of bilateral IJs on CT. D/w Dr. Golden Circle.  Dr. Nicole Kindred has discussed with family who  determined last seen normal was 12 PM. He will be administering systemic TPA and plan to take patient to interventional radiology for intra-arterial TPA. Discussed with critical care doctor McQuaid at bedside. Airway is stable for transfer to interventional radiology.     CRITICAL CARE Performed by: Ezequiel Essex Total critical care time: 60 Critical care time was exclusive of separately billable procedures and treating other patients. Critical care was necessary to treat or prevent imminent or life-threatening deterioration. Critical care was time spent personally by me on the following activities: development of treatment plan with patient and/or surrogate as well as nursing, discussions with consultants, evaluation of patient's response to treatment, examination of patient, obtaining history from patient or surrogate, ordering and performing treatments and interventions, ordering and review of laboratory studies, ordering and review of radiographic studies, pulse oximetry and re-evaluation of patient's condition.   Ezequiel Essex, MD 03/08/14 2325

## 2014-03-08 NOTE — Sedation Documentation (Signed)
Report received from Roe Coombs, RN

## 2014-03-08 NOTE — Anesthesia Preprocedure Evaluation (Signed)
Anesthesia Evaluation  Patient identified by MRN, date of birth, ID band Patient unresponsive  General Assessment Comment:To IR emrgently for intervention, evolving CVA  Airway        Dental   Pulmonary  breath sounds clear to auscultation        Cardiovascular hypertension, Rhythm:Regular Rate:Normal     Neuro/Psych    GI/Hepatic   Endo/Other    Renal/GU      Musculoskeletal  (+) Arthritis -,   Abdominal   Peds  Hematology   Anesthesia Other Findings   Reproductive/Obstetrics                             Anesthesia Physical Anesthesia Plan  ASA: IV and emergent  Anesthesia Plan: General   Post-op Pain Management:    Induction: Intravenous  Airway Management Planned: Oral ETT  Additional Equipment:   Intra-op Plan:   Post-operative Plan: Post-operative intubation/ventilation  Informed Consent: I have reviewed the patients History and Physical, chart, labs and discussed the procedure including the risks, benefits and alternatives for the proposed anesthesia with the patient or authorized representative who has indicated his/her understanding and acceptance.   Dental advisory given  Plan Discussed with: CRNA and Surgeon  Anesthesia Plan Comments:         Anesthesia Quick Evaluation

## 2014-03-08 NOTE — ED Notes (Signed)
EDP, Neurologist, rapid response team at bedside assessing patient.

## 2014-03-08 NOTE — Consult Note (Signed)
PULMONARY / CRITICAL CARE MEDICINE   Name: Sandy Barrett MRN: [id number] DOB: [birth year]    ADMISSION DATE:  03/08/2014 CONSULTATION DATE:  03/08/2014  REFERRING MD :  Rancour  CHIEF COMPLAINT:  Stoke, PE  INITIAL PRESENTATION: 81 yo female came to [facility] on 03/08/2014 with stroke due to L MCA occlusion, and found to have bilateral PE.    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  Further history could not be obtained due to aphasia.  The case was discussed with the patients family and the ED and neurology physicians. Family noted new onset aphasia and right sided weakness on 3/3.  She was last seen normal around 1:00 PM.  PAST MEDICAL HISTORY :   has a past medical history of HYPERTENSION; HYPERTHYROIDISM; OBESITY; ARTHRITIS; Posttraumatic stress disorder; INSOMNIA; MIGRAINE HEADACHE; Hyperthyroidism; and Arthritis.  has past surgical history that includes Abdominal hysterectomy (1976); Total hip arthroplasty (1998 ); Tonsillectomy; and Breast surgery. Prior to Admission medications   Medication Sig Start Date End Date Taking? Authorizing Provider  butalbital-acetaminophen-caffeine (FIORICET, ESGIC) 50-325-40 MG per tablet TAKE 1 TABLET BY MOUTH TWICE A DAY AS NEEDED FOR HEADACHE   Yes Rowe Clack, MD  diclofenac sodium (VOLTAREN) 1 % GEL Apply 2 g topically 3 (three) times daily. To affected joint. 10/03/13  Yes Lyndal Pulley, DO  esomeprazole (NEXIUM) 40 MG capsule Take 1 capsule (40 mg total) by mouth daily. 12/07/13  Yes Lyndal Pulley, DO   hydrochlorothiazide (HYDRODIURIL) 25 MG tablet Take 1 tablet (25 mg total) by mouth daily. 12/13/13  Yes Rowe Clack, MD  meloxicam (MOBIC) 7.5 MG tablet Take 1 tablet (7.5 mg total) by mouth daily. 08/07/13  Yes Rowe Clack, MD  polyethylene glycol powder (GLYCOLAX/MIRALAX) powder TAKE 17 G BY MOUTH DAILY.   Yes Rowe Clack, MD  Probiotic Product (ALIGN) 4 MG CAPS Take by mouth daily.     Yes Historical Provider, MD  propranolol (INDERAL) 20 MG tablet Take 1 tablet (20 mg total) by mouth 2 (two) times daily. 12/13/13  Yes Rowe Clack, MD  traMADol (ULTRAM) 50 MG tablet Take 1 tablet (50 mg total) by mouth 3 (three) times daily as needed. 02/21/14  Yes Lyndal Pulley, DO  traZODone (DESYREL) 50 MG tablet Take 1 tablet (50 mg total) by mouth at bedtime as needed for sleep. 05/02/12  Yes Rowe Clack, MD   No Known Allergies  FAMILY HISTORY:  has no family status information on file.  SOCIAL HISTORY:  reports that she has never smoked. She does not have any smokeless tobacco history on file. She reports that she does not drink alcohol or use illicit drugs.  REVIEW OF SYSTEMS:  Could not obtain due to   SUBJECTIVE:   VITAL SIGNS: Temp:  [97.8 F (36.6 C)-98.6 F (37 C)] 97.9 F (36.6 C) (03/03 1733) Pulse Rate:  [67-82] 82 (03/03 1723) Resp:  [17-23] 17 (03/03 1733) BP: (107-170)/(45-110) 117/65 mmHg (03/03 1733) SpO2:  [90 %-96 %] 96 % (03/03 1723) FiO2 (%):  [32 %]  32 % (03/03 1533) HEMODYNAMICS:   VENTILATOR SETTINGS: Vent Mode:  [-]  FiO2 (%):  [32 %] 32 % INTAKE / OUTPUT:  Intake/Output Summary (Last 24 hours) at 03/08/14 1955 Last data filed at 03/08/14 1948  Gross per 24 hour  Intake   1000 ml  Output    400 ml  Net    600 ml    PHYSICAL EXAMINATION: General: awake, interactive Neuro:  R side neglect, aphasia HEENT:  NCAT, EOMi Cardiovascular:  RRR, no mgr Lungs:  CTA B Abdomen:  BS+, soft, nontender Musculoskeletal:  Normal bulk  and tone Skin:  No rash  LABS:  CBC  Recent Labs Lab 03/08/14 1448 03/08/14 1545 03/08/14 1553  WBC 8.7  --   --   HGB 12.9 14.6 15.0  HCT 40.4 43.0 44.0  PLT 409*  --   --    Coag's  Recent Labs Lab 03/08/14 1448  APTT 32  INR 1.18   BMET  Recent Labs Lab 03/08/14 1448 03/08/14 1545 03/08/14 1553  NA 143 143 142  K 5.1 3.4* 4.7  CL 105 103 101  CO2 31  --   --   BUN 13 12 16   CREATININE 0.76 0.70 0.70  GLUCOSE 123* 116* 120*   Electrolytes  Recent Labs Lab 03/08/14 1448  CALCIUM 9.0   Sepsis Markers No results for input(s): LATICACIDVEN, PROCALCITON, O2SATVEN in the last 168 hours. ABG No results for input(s): PHART, PCO2ART, PO2ART in the last 168 hours. Liver Enzymes  Recent Labs Lab 03/08/14 1448  AST 50*  ALT 18  ALKPHOS 63  BILITOT 1.8*  ALBUMIN 3.3*   Cardiac Enzymes No results for input(s): TROPONINI, PROBNP in the last 168 hours. Glucose  Recent Labs Lab 03/08/14 1451 03/08/14 1516  GLUCAP 116* 106*    Imaging No results found.   ASSESSMENT / PLAN:  NEUROLOGIC A:  Acute L MCA CVA, picture worrisome for hypercoagulable state (related to thyroid malignancy?) P:   RASS goal: -1 Fentanyl gtt for vent synchrony Post stroke care per neurology  HEMATOLOGIC A:  Likely hypercoagulable state given MCA stroke and PE; CT worrisome for thyroid malignancy.  Family notes that she has been treated for a "thyroid problem" but they don't know details. P:  Will need hypercoagulability panel but will not collect now as she just receive TPA There may be a window tomorrow morning when we can collect full hypercoagulable panel prior to starting heparin Will need full dose heparin and then likely DOAC for PE, but will need to ensure no cerebral bleeding after large MCA stroke and intracerebral procedure Will check LE doppler ultrasound to ensure no LE clot  PULMONARY A: Acute respiratory failure with hypoxemia due to PE and sedated on  vent for IR procedure P:   Full vent support  Daily SBT/WUA CXR/ABG  CARDIOVASCULAR A:  Acute PE > s/p TPA Acute CVA P:  BP goals per neurology   RENAL A:  No acute issues P:   Monitor BMET and UOP Replace electrolytes as needed  GASTROINTESTINAL A:  No acute issues P:   Gastric access with OG tube pepcid for stress ulcer prophylaxis  INFECTIOUS A:  No acute issues P:   Monitor for fever  ENDOCRINE A:  No acute issues   P:   Monitor glucose  FAMILY  - Updates: Updated daughters at bedside 3/3  My CC time 84 minutes  Roselie Awkward, MD [facility] Pager: [phone] Cell: [phone] If no response, call [phone]  03/08/2014, 7:55 PM

## 2014-03-08 NOTE — Consult Note (Signed)
H&P    Chief Complaint: code stroke  HPI:                                                                                                                                         Sandy Barrett is an 81 y.o. female with history of hypertension, hyperlipidemia and hyperthyroidism, brought to the emergency room at [facility] with new onset speech difficulty. Patient was noted to have right-sided weakness and facial droop on arrival in the emergency room. She was last known well at noon when she talked on the phone with her sister and is doing well. She went to a hair appointment today at 1300 hours and the hair dresser immediately noted she was confused and not talking correctly. Her daughter noted difficulty with speech output as well as dysarthria. She was brought to [facility] ED and upon entering ED code stroke was called. Patient was transferred to [facility] ED.  CT showed a dense left MCA sign. Currently she is aphasic, right hemiparesis, right hemianopsia.  Decision to go to CT for CT perfusion was made. Perfusion study showed a large area of penumbra involving the left MCA territory. tPA was delayed secondary to difficulty determining last known well after multiple phone calls with family. It was determined reliably that patient was last known well at noon. Patient subsequently required CT chest to rule out possible AO aortic dissection due to widened mediastinum seen on chest x-ray. Chest CT not show an aortic aneurysm, nor dissection of aorta. Study, however, did show a large thyroid mass as well as bilateral pulmonary emboli.  After determine no aortic dissection,  tPA was administered. There was minimal improvement, and patient was subsequently taken to Interventional Radiology for further management    Date last known well: Date: 03/07/2014 Time last known well: Time: 12:00 noon tPA Given: No: out of window Modified Rankin: Rankin Score=4    Past Medical History  Diagnosis Date  .  HYPERTENSION   . HYPERTHYROIDISM   . OBESITY   . ARTHRITIS   . Posttraumatic stress disorder   . INSOMNIA   . MIGRAINE HEADACHE   . Hyperthyroidism     s/p I-131 ablation 03/2011 of multinod goiter  . Arthritis     Past Surgical History  Procedure Laterality Date  . Abdominal hysterectomy  1976  . Total hip arthroplasty  1998     right  . Tonsillectomy    . Breast surgery      biopsy    Family History  Problem Relation Age of Onset  . Asthma Mother   . Asthma Father   . Prostate cancer Father    Social History:  reports that she has never smoked. She does not have any smokeless tobacco history on file. She reports that she does not drink alcohol or use illicit drugs.  Allergies: No Known Allergies  Medications:  No current facility-administered medications for this encounter.   Current Outpatient Prescriptions  Medication Sig Dispense Refill  . butalbital-acetaminophen-caffeine (FIORICET, ESGIC) 50-325-40 MG per tablet TAKE 1 TABLET BY MOUTH TWICE A DAY AS NEEDED FOR HEADACHE 40 tablet 0  . diclofenac sodium (VOLTAREN) 1 % GEL Apply 2 g topically 3 (three) times daily. To affected joint. 100 g 11  . esomeprazole (NEXIUM) 40 MG capsule Take 1 capsule (40 mg total) by mouth daily. 90 capsule 0  . hydrochlorothiazide (HYDRODIURIL) 25 MG tablet Take 1 tablet (25 mg total) by mouth daily. 30 tablet 5  . meloxicam (MOBIC) 7.5 MG tablet Take 1 tablet (7.5 mg total) by mouth daily. 30 tablet 5  . polyethylene glycol powder (GLYCOLAX/MIRALAX) powder TAKE 17 G BY MOUTH DAILY. 3350 g 0  . Probiotic Product (ALIGN) 4 MG CAPS Take by mouth daily.      . propranolol (INDERAL) 20 MG tablet Take 1 tablet (20 mg total) by mouth 2 (two) times daily. 60 tablet 5  . traMADol (ULTRAM) 50 MG tablet Take 1 tablet (50 mg total) by mouth 3 (three) times daily as needed. 90  tablet 0  . traZODone (DESYREL) 50 MG tablet Take 1 tablet (50 mg total) by mouth at bedtime as needed for sleep. 30 tablet 1     ROS:                                                                                                                                       History obtained from patient's daughters.  General ROS: negative for - chills, fatigue, fever, night sweats, weight gain or weight loss Psychological ROS: negative for - behavioral disorder, hallucinations, memory difficulties, mood swings or suicidal ideation Ophthalmic ROS: negative for - blurry vision, double vision, eye pain or loss of vision ENT ROS: negative for - epistaxis, nasal discharge, oral lesions, sore throat, tinnitus or vertigo Allergy and Immunology ROS: negative for - hives or itchy/watery eyes Hematological and Lymphatic ROS: negative for - bleeding problems, bruising or swollen lymph nodes Endocrine ROS: negative for - galactorrhea, hair pattern changes, polydipsia/polyuria or temperature intolerance Respiratory ROS: negative for - cough, hemoptysis, shortness of breath or wheezing Cardiovascular ROS: negative for - chest pain, dyspnea on exertion, edema or irregular heartbeat Gastrointestinal ROS: negative for - abdominal pain, diarrhea, hematemesis, nausea/vomiting or stool incontinence Genito-Urinary ROS: negative for - dysuria, hematuria, incontinence or urinary frequency/urgency Musculoskeletal ROS: lower extremity joint pains Neurological ROS: as noted in HPI Dermatological ROS: negative for rash and skin lesion changes   Neurologic Examination:  Blood pressure 113/56, pulse 68, temperature 98.6 F (37 C), resp. rate 23, SpO2 90 %.  HEENT-  Normocephalic, no lesions, without obvious abnormality.  Normal external eye and conjunctiva.  Normal TM's bilaterally.  Normal auditory canals and external ears.  Normal external nose, mucus membranes and septum.  Normal pharynx. Cardiovascular- S1, S2 normal, pulses palpable throughout   Lungs- chest clear, no wheezing, rales, normal symmetric air entry Abdomen- normal findings: bowel sounds normal Extremities- no edema Lymph-no adenopathy palpable Musculoskeletal-no joint tenderness, deformity or swelling Skin-warm and dry, no hyperpigmentation, vitiligo, or suspicious lesions  Neurological Examination Mental Status: Alert, both expressive and receptive aphasic. Unable to follow commands.  Cranial Nerves: II: Discs flat bilaterally; Visual fields shows a right hemianopsia   III,IV, VI: pupils equal, round, reactive to light and accommodation ptosis not present, extra-ocular motions intact bilaterally V,VII: smile asymmetric with marked right lower facial weakness; light touch sensation noted to be decreased on the right VIII: hearing normal bilaterally IX,X: uvula rises symmetrically; markedly dysarthric speech [id number] shoulder shrug on right.  XII: midline tongue extension Motor: Right : Upper extremity   0/5    Left:     Upper extremity   5/5  Lower extremity   0/5     Lower extremity   5/5 Tone and bulk:normal tone throughout; no atrophy noted Sensory: no reaction to pain on the right arm and leg Deep Tendon Reflexes: 2+ and symmetric throughout Plantars:  Right: mute Left: downgoing Cerebellar: Unable to assess Gait: not able to assess    Lab Results: Basic Metabolic Panel: No results for input(s): NA, K, CL, CO2, GLUCOSE, BUN, CREATININE, CALCIUM, MG, PHOS in the last 168 hours.  Liver Function Tests: No results for input(s): AST, ALT, ALKPHOS, BILITOT, PROT, ALBUMIN in the last 168 hours. No results for input(s): LIPASE, AMYLASE in the last 168 hours. No results for input(s): AMMONIA in the last 168 hours.  CBC:  Recent Labs Lab 03/08/14 1448  WBC 8.7  NEUTROABS 6.6  HGB 12.9  HCT 40.4  MCV 94.6  PLT 409*     Cardiac Enzymes: No results for input(s): CKTOTAL, CKMB, CKMBINDEX, TROPONINI in the last 168 hours.  Lipid Panel: No results for input(s): CHOL, TRIG, HDL, CHOLHDL, VLDL, LDLCALC in the last 168 hours.  CBG:  Recent Labs Lab 03/08/14 1451 03/08/14 1516  GLUCAP 116* 106*    Microbiology: Results for orders placed or performed during the hospital encounter of 04/28/12  Urine culture     Status: None   Collection Time: 04/28/12  4:24 AM  Result Value Ref Range Status   Specimen Description URINE, CLEAN CATCH  Final   Special Requests NONE  Final   Culture  Setup Time 04/28/2012 08:34  Final   Colony Count >=100,000 COLONIES/ML  Final   Culture ESCHERICHIA COLI  Final   Report Status 04/30/2012 FINAL  Final   Organism ID, Bacteria ESCHERICHIA COLI  Final      Susceptibility   Escherichia coli - MIC*    AMPICILLIN <=2 SENSITIVE Sensitive     CEFAZOLIN <=4 SENSITIVE Sensitive     CEFTRIAXONE <=1 SENSITIVE Sensitive     CIPROFLOXACIN <=0.25 SENSITIVE Sensitive     GENTAMICIN <=1 SENSITIVE Sensitive     LEVOFLOXACIN <=0.12 SENSITIVE Sensitive     NITROFURANTOIN <=16 SENSITIVE Sensitive     TOBRAMYCIN <=1 SENSITIVE Sensitive     TRIMETH/SULFA <=20 SENSITIVE Sensitive     PIP/TAZO <=4 SENSITIVE Sensitive     *  ESCHERICHIA COLI    Coagulation Studies: No results for input(s): LABPROT, INR in the last 72 hours.  Imaging: Ct Head Wo Contrast  03/08/2014   CLINICAL DATA:  Acute onset right-sided facial droop and aphasia  EXAM: CT HEAD WITHOUT CONTRAST  TECHNIQUE: Contiguous axial images were obtained from the base of the skull through the vertex without intravenous contrast.  COMPARISON:  November 13, 2012  FINDINGS: The ventricles are normal in size and configuration. There is no intracranial mass, hemorrhage, extra-axial fluid collection, or midline shift. The gray and white compartments appear normal. No acute infarct is apparent.  There is increased attenuation in the left  middle cerebral artery compared to prior study and compared to the right side.  The bony calvarium appears intact.  The mastoid air cells are clear.  IMPRESSION: Increased attenuation in the left middle cerebral artery. Early thrombosis in this vessel must be of concern given this appearance. Elsewhere gray-white compartments appear normal. No hemorrhage or mass effect.  Critical Value/emergent results were called by telephone at the time of interpretation on 03/08/2014 at 3:06 pm to Dr. Lacretia Leigh , who verbally acknowledged these results.   Electronically Signed   By: Lowella Grip III M.D.   On: 03/08/2014 15:09    Etta Quill PA-C [facility] [phone]  03/08/2014, 3:23 PM   Assessment: 81 y.o. female with multiple risk factors for stroke presenting with acute MCA territory scheming stroke with associated thrombus involving right MCA.  Stroke Risk Factors - hypertension , family history and hyperlipidemia  Recommendations: 1. Post TPA and IR management in neuro intensive care unit 2. Hemoglobin A1c and fasting lipid panel 3. MRI of the brain without contrast 4. 2-D echocardiogram 5. Physical therapy, occupational therapy and speech therapy consults  6. Prophylactic therapy stroke prevention:  to be determined based on stroke size as well as presence of emboli and likely need for anticoagulation  I personally per deciliter this patient's evaluation and management, including neurological examination as well as formulating the above clinical impression and management recommendations.  This patient is critically ill and at significant risk of neurological worsening, death and care requires constant monitoring of vital signs, hemodynamics,respiratory and cardiac monitoring, neurological assessment, discussion with family, other specialists and medical decision making of high complexity. Total critical care time was 120 minutes.  Rush Farmer M.D. [facility] [phone]

## 2014-03-08 NOTE — ED Notes (Signed)
Awaiting to take pt to IR.  Awaiting available bed.

## 2014-03-08 NOTE — Procedures (Signed)
S/Pbilateral common carotid and Lt vertebral arteriograms,followed by unsuccessful  attempt at revascularization due technical difficulties

## 2014-03-08 NOTE — ED Notes (Signed)
Anesthesia at bedside

## 2014-03-08 NOTE — Code Documentation (Signed)
81yo female arriving to [facility] via [place] at 1508.  Patient presented to [facility] with aphasia and right sided weakness. Code stroke activated at [facility].  Labs and CT completed.  Patient transferred to [facility] from [facility].  Patient's daughter at the bedside on arrival.  She reports that she was called by family reporting that her mother was confused at the beauty salon.  Her daughter went to the beauty shop and reports the patient was having difficulty speaking, but was able to verbalize that she had trouble getting out of the chair this morning.  Daughter drove the patient to the hospital.  Daughter had not talked to her mother today and reports no one else in the family has either.  Daughter called the beauty shop who reports that the patient was notably confused on arrival there.  Her appointment was scheduled for 1330.  Her other daughter to the bedside who reports talking to her mother on the phone yesterday at 1700.  NIHSS 25, see documentation for details and code stroke times.  Patient with right hemiplegia, right neglect, left gaze and aphasia.  Dr. Nicole Kindred at the bedside.  CT showing dense left MCA.  Due to unclear LKW patient for CT perfusion study.  18G PIV placed by EDP using ultrasound.  PCXR completed and foley catheter placed. Patient to CT with stroke team.  Patient back to Trauma B.  PCXR results concerning for dissection per EDP.  Patient's daughter called her aunt who reports that she did speak to the patient today at 1200 and she was well and at her baseline.  LKW now 1200.  Pharmacy notified to mix tPA at 1608.  Patient back to CT for CTA chest to r/o dissection.  tPA delivered at 1617.  CTA showing bilateral PE per MD.  Dr. Nicole Kindred to discuss results with Radiologist.  Patient back to Trauma B.  Dr. Nicole Kindred called with order to start tPA and tPA administered at 1627.  IR at the bedside to transport patient for endovascular procedure when ready.  Bedside handoff with IR RN Clarise Cruz.

## 2014-03-08 NOTE — ED Provider Notes (Signed)
CSN: [id number]     Arrival date & time 03/08/14  1448 History   First MD Initiated Contact with Patient 03/08/14 1453     Chief Complaint  Patient presents with  . Code Stroke     (Consider location/radiation/quality/duration/timing/severity/associated sxs/prior Treatment) HPI Comments: Patient here with acute onset of expressive aphasia with right-sided weakness that began approximately 1 hour prior to arrival. She has a history of hypertension. No prior history of CVA. Symptoms have been persistent. They occur while she was having her hair done. Daughter transported the patient here. No treatment use prior to arrival. Nothing made her symptoms better or worse.  The history is provided by the patient and a relative. The history is limited by the condition of the patient.    Past Medical History  Diagnosis Date  . HYPERTENSION   . HYPERTHYROIDISM   . OBESITY   . ARTHRITIS   . Posttraumatic stress disorder   . INSOMNIA   . MIGRAINE HEADACHE   . Hyperthyroidism     s/p I-131 ablation 03/2011 of multinod goiter  . Arthritis    Past Surgical History  Procedure Laterality Date  . Abdominal hysterectomy  1976  . Total hip arthroplasty  1998     right  . Tonsillectomy    . Breast surgery      biopsy   Family History  Problem Relation Age of Onset  . Asthma Mother   . Asthma Father   . Prostate cancer Father    History  Substance Use Topics  . Smoking status: Never Smoker   . Smokeless tobacco: Not on file  . Alcohol Use: No   OB History    No data available     Review of Systems  Unable to perform ROS     Allergies  Review of patient's allergies indicates no known allergies.  Home Medications   Prior to Admission medications   Medication Sig Start Date End Date Taking? Authorizing Provider  butalbital-acetaminophen-caffeine (FIORICET, ESGIC) 50-325-40 MG per tablet TAKE 1 TABLET BY MOUTH TWICE A DAY AS NEEDED FOR HEADACHE    Rowe Clack, MD   diclofenac sodium (VOLTAREN) 1 % GEL Apply 2 g topically 3 (three) times daily. To affected joint. 10/03/13   Lyndal Pulley, DO  esomeprazole (NEXIUM) 40 MG capsule Take 1 capsule (40 mg total) by mouth daily. 12/07/13   Lyndal Pulley, DO  hydrochlorothiazide (HYDRODIURIL) 25 MG tablet Take 1 tablet (25 mg total) by mouth daily. 12/13/13   Rowe Clack, MD  meloxicam (MOBIC) 7.5 MG tablet Take 1 tablet (7.5 mg total) by mouth daily. 08/07/13   Rowe Clack, MD  polyethylene glycol powder (GLYCOLAX/MIRALAX) powder TAKE 17 G BY MOUTH DAILY.    Rowe Clack, MD  Probiotic Product (ALIGN) 4 MG CAPS Take by mouth daily.      Historical Provider, MD  propranolol (INDERAL) 20 MG tablet Take 1 tablet (20 mg total) by mouth 2 (two) times daily. 12/13/13   Rowe Clack, MD  traMADol (ULTRAM) 50 MG tablet Take 1 tablet (50 mg total) by mouth 3 (three) times daily as needed. 02/21/14   Lyndal Pulley, DO  traZODone (DESYREL) 50 MG tablet Take 1 tablet (50 mg total) by mouth at bedtime as needed for sleep. 05/02/12   Rowe Clack, MD   There were no vitals taken for this visit. Physical Exam  Constitutional: She appears well-developed and well-nourished.  Non-toxic appearance. No distress.  HENT:  Head: Normocephalic and atraumatic.  Eyes: Conjunctivae, EOM and lids are normal. Pupils are equal, round, and reactive to light.  Neck: Normal range of motion. Neck supple. No tracheal deviation present. No thyroid mass present.  Cardiovascular: Normal rate, regular rhythm and normal heart sounds.  Exam reveals no gallop.   No murmur heard. Pulmonary/Chest: Effort normal and breath sounds normal. No stridor. No respiratory distress. She has no decreased breath sounds. She has no wheezes. She has no rhonchi. She has no rales.  Abdominal: Soft. Normal appearance and bowel sounds are normal. She exhibits no distension. There is no tenderness. There is no rebound and no CVA tenderness.   Musculoskeletal: Normal range of motion. She exhibits no edema or tenderness.  Neurological: She is alert. She exhibits abnormal muscle tone. GCS eye subscore is 4. GCS verbal subscore is 2. GCS motor subscore is 5.  Expressive aphasia noted. Right sided facial droop noted.  Skin: Skin is warm and dry. No abrasion and no rash noted.  Psychiatric: She has a normal mood and affect. Her speech is normal and behavior is normal.  Nursing note and vitals reviewed.   ED Course  Procedures (including critical care time) Labs Review Labs Reviewed  CBG MONITORING, ED - Abnormal; Notable for the following:    Glucose-Capillary 116 (*)    All other components within normal limits  PROTIME-INR  APTT  CBC  DIFFERENTIAL  COMPREHENSIVE METABOLIC PANEL  I-STAT TROPOININ, ED    Imaging Review No results found.   EKG Interpretation None      MDM   Final diagnoses:  Cerebral infarction due to unspecified mechanism    3:08 PM   Patient called a Stroke on arrival here. Patient's last seen normal was 1 hour approximately. Her CBG is 116. Patient's head CT did not show any acute hemorrhage on  my interpretation. Case discussed with Dr. Nicole Kindred from neurology and EMS is on property this time. Will not start Tpa at this time and will transfer patient to [facility]. Patient's airway is stable at time of transfer.    Leota Jacobsen, MD 03/08/14 [phone]

## 2014-03-08 NOTE — Sedation Documentation (Addendum)
First puncture right groin

## 2014-03-08 NOTE — ED Notes (Signed)
Paged PCCM to [phone] 

## 2014-03-08 NOTE — Anesthesia Postprocedure Evaluation (Signed)
  Anesthesia Post-op Note  Patient: Sandy Barrett  Procedure(s) Performed: Procedure(s): RADIOLOGY WITH ANESTHESIA (Left)  Patient Location: PACU and NICU  Anesthesia Type:General  Level of Consciousness: Patient remains intubated per anesthesia plan  Airway and Oxygen Therapy: Patient remains intubated per anesthesia plan  Post-op Pain: mild  Post-op Assessment: Post-op Vital signs reviewed  Post-op Vital Signs: Reviewed  Last Vitals:  Filed Vitals:   03/08/14 2145  BP:   Pulse: 91  Temp:   Resp: 12    Complications: No apparent anesthesia complications

## 2014-03-09 ENCOUNTER — Encounter (HOSPITAL_COMMUNITY): Payer: Self-pay | Admitting: Interventional Radiology

## 2014-03-09 ENCOUNTER — Inpatient Hospital Stay (HOSPITAL_COMMUNITY): Payer: Medicare Other

## 2014-03-09 DIAGNOSIS — I82403 Acute embolism and thrombosis of unspecified deep veins of lower extremity, bilateral: Secondary | ICD-10-CM

## 2014-03-09 DIAGNOSIS — I2699 Other pulmonary embolism without acute cor pulmonale: Secondary | ICD-10-CM

## 2014-03-09 DIAGNOSIS — I63412 Cerebral infarction due to embolism of left middle cerebral artery: Secondary | ICD-10-CM

## 2014-03-09 DIAGNOSIS — E049 Nontoxic goiter, unspecified: Secondary | ICD-10-CM

## 2014-03-09 DIAGNOSIS — I639 Cerebral infarction, unspecified: Secondary | ICD-10-CM

## 2014-03-09 LAB — RENAL FUNCTION PANEL
Albumin: 2.5 g/dL — ABNORMAL LOW (ref 3.5–5.2)
Anion gap: 10 (ref 5–15)
BUN: 8 mg/dL (ref 6–23)
CO2: 25 mmol/L (ref 19–32)
Calcium: 8.3 mg/dL — ABNORMAL LOW (ref 8.4–10.5)
Chloride: 108 mmol/L (ref 96–112)
Creatinine, Ser: 0.55 mg/dL (ref 0.50–1.10)
GFR calc Af Amer: >90 mL/min (ref >90)
GFR calc non Af Amer: 87 mL/min — ABNORMAL LOW (ref >90)
Glucose, Bld: 134 mg/dL — ABNORMAL HIGH (ref 70–99)
Phosphorus: 4.2 mg/dL (ref 2.3–4.6)
Potassium: 3.1 mmol/L — ABNORMAL LOW (ref 3.5–5.1)
Sodium: 143 mmol/L (ref 135–145)

## 2014-03-09 LAB — BASIC METABOLIC PANEL
Anion gap: 7 (ref 5–15)
BUN: 7 mg/dL (ref 6–23)
CO2: 26 mmol/L (ref 19–32)
Calcium: 8.2 mg/dL — ABNORMAL LOW (ref 8.4–10.5)
Chloride: 109 mmol/L (ref 96–112)
Creatinine, Ser: 0.53 mg/dL (ref 0.50–1.10)
GFR calc Af Amer: >90 mL/min (ref >90)
GFR calc non Af Amer: 88 mL/min — ABNORMAL LOW (ref >90)
Glucose, Bld: 137 mg/dL — ABNORMAL HIGH (ref 70–99)
Potassium: 3 mmol/L — ABNORMAL LOW (ref 3.5–5.1)
Sodium: 142 mmol/L (ref 135–145)

## 2014-03-09 LAB — LIPID PANEL
Cholesterol: 95 mg/dL (ref 0–200)
HDL: 31 mg/dL — ABNORMAL LOW (ref >39)
LDL Cholesterol: 53 mg/dL (ref 0–99)
Total CHOL/HDL Ratio: 3.1 RATIO
Triglycerides: 54 mg/dL (ref <150)
VLDL: 11 mg/dL (ref 0–40)

## 2014-03-09 LAB — CBC
HCT: 32.6 % — ABNORMAL LOW (ref 36.0–46.0)
Hemoglobin: 10.4 g/dL — ABNORMAL LOW (ref 12.0–15.0)
MCH: 29.2 pg (ref 26.0–34.0)
MCHC: 31.9 g/dL (ref 30.0–36.0)
MCV: 91.6 fL (ref 78.0–100.0)
Platelets: 287 10*3/uL (ref 150–400)
RBC: 3.56 MIL/uL — ABNORMAL LOW (ref 3.87–5.11)
RDW: 16.1 % — ABNORMAL HIGH (ref 11.5–15.5)
WBC: 11.3 10*3/uL — ABNORMAL HIGH (ref 4.0–10.5)

## 2014-03-09 LAB — GLUCOSE, CAPILLARY
Glucose-Capillary: 111 mg/dL — ABNORMAL HIGH (ref 70–99)
Glucose-Capillary: 112 mg/dL — ABNORMAL HIGH (ref 70–99)
Glucose-Capillary: 116 mg/dL — ABNORMAL HIGH (ref 70–99)
Glucose-Capillary: 131 mg/dL — ABNORMAL HIGH (ref 70–99)
Glucose-Capillary: 138 mg/dL — ABNORMAL HIGH (ref 70–99)
Glucose-Capillary: 83 mg/dL (ref 70–99)

## 2014-03-09 LAB — PHOSPHORUS: Phosphorus: 4.2 mg/dL (ref 2.3–4.6)

## 2014-03-09 LAB — MAGNESIUM: Magnesium: 1.8 mg/dL (ref 1.5–2.5)

## 2014-03-09 LAB — ANTITHROMBIN III: AntiThromb III Func: 93 % (ref 75–120)

## 2014-03-09 MED ORDER — HEPARIN (PORCINE) IN NACL 100-0.45 UNIT/ML-% IJ SOLN
1350.0000 [IU]/h | INTRAMUSCULAR | Status: DC
  Administered 2014-03-09: 900 [IU]/h via INTRAVENOUS
  Administered 2014-03-10: 1050 [IU]/h via INTRAVENOUS
  Administered 2014-03-11: 1150 [IU]/h via INTRAVENOUS
  Administered 2014-03-12: 1250 [IU]/h via INTRAVENOUS
  Administered 2014-03-13: 1350 [IU]/h via INTRAVENOUS
  Filled 2014-03-09 (×8): qty 250

## 2014-03-09 MED ORDER — FENTANYL CITRATE 0.05 MG/ML IJ SOLN
12.5000 ug | INTRAMUSCULAR | Status: DC | PRN
  Administered 2014-03-09: 12.5 ug via INTRAVENOUS
  Filled 2014-03-09 (×2): qty 2

## 2014-03-09 MED ORDER — PROPRANOLOL HCL 20 MG PO TABS: 20.0000 mg | ORAL_TABLET | Freq: Two times a day (BID) | ORAL | Status: DC

## 2014-03-09 MED ORDER — PANTOPRAZOLE SODIUM 40 MG PO PACK
40.0000 mg | PACK | ORAL | Status: DC
  Administered 2014-03-09 – 2014-03-13 (×4): 40 mg
  Filled 2014-03-09 (×5): qty 20

## 2014-03-09 MED ORDER — HYDROCHLOROTHIAZIDE 25 MG PO TABS: 25.0000 mg | ORAL_TABLET | Freq: Every day | ORAL | Status: DC

## 2014-03-09 NOTE — Progress Notes (Signed)
VASCULAR LAB PRELIMINARY  PRELIMINARY  PRELIMINARY  PRELIMINARY  Bilateral lower extremity venous duplex and carotid duplex completed.    Preliminary report:   1.  Venous:  DVT noted in the right distal CFV, FV, and pop v.  DVT noted in the left FV, pop V.  2.  Carotid:  Bilateral:  1-39% ICA stenosis.  Vertebral artery flow is antegrade.  Left side waveforms are pulsatile throughout and thumpy in the ICA suggesting distal occlusion.   Right jugular vein flow is sluggish, but no thrombus noted.  Left jugular vein thrombus appears subacute and is non occlusive.   Sandy Barrett, RVT 03/09/2014, 10:21 AM

## 2014-03-09 NOTE — Progress Notes (Signed)
STROKE TEAM PROGRESS NOTE   HISTORY Sandy Barrett is a 81 y.o. female with history of hypertension, hyperlipidemia and hyperthyroidism, brought to the emergency room at [facility] with new onset speech difficulty. Patient was noted to have right-sided weakness and facial droop on arrival in the emergency room. She was last known well at noon when she talked on the phone with her sister and is doing well. She went to a hair appointment today at 1300 hours and the hair dresser immediately noted she was confused and not talking correctly. Her daughter noted difficulty with speech output as well as dysarthria. She was brought to [facility] ED and upon entering ED code stroke was called. Patient was transferred to [facility] ED. CT showed a dense left MCA sign. Currently she is aphasic, right hemiparesis, right hemianopsia. Decision to go to CT for CT perfusion was made. Perfusion study showed a large area of penumbra involving the left MCA territory. tPA was delayed secondary to difficulty determining last known well after multiple phone calls with family. It was determined reliably that patient was last known well at noon. Patient subsequently required CT chest to rule out possible AO aortic dissection due to widened mediastinum seen on chest x-ray. Chest CT not show an aortic aneurysm, nor dissection of aorta. Study, however, did show a large thyroid mass as well as bilateral pulmonary emboli. After determine no aortic dissection, tPA was administered. There was minimal improvement, and patient was subsequently taken to Interventional Radiology for further management   S/P bilateral common carotid and Lt vertebral arteriograms,followed by unsuccessful attempt at revascularization due technical difficulties - Dr Estanislado Pandy.  Date last known well: Date: 03/07/2014 Time last known well: Time: 12:00 noon tPA Given: No: out of window Modified Rankin: Rankin Score=4   SUBJECTIVE (INTERVAL HISTORY) Her  daughters are at the bedside. Overall her condition is unchanged. She is still intubated and on fentanyl. Not following commands, but open eyes on voice. Right hemiplegic. Will need MRI to see the extension of infarct. Waiting for LE venous doppler.   OBJECTIVE Temp:  [97.8 F (36.6 C)-98.6 F (37 C)] 98.3 F (36.8 C) (03/04 0400) Pulse Rate:  [40-93] 40 (03/04 0700) Cardiac Rhythm:  [-] Normal sinus rhythm (03/04 0600) Resp:  [3-24] 14 (03/04 0700) BP: (91-170)/(45-110) 113/63 mmHg (03/04 0700) SpO2:  [90 %-100 %] 100 % (03/04 0700) Arterial Line BP: (87-162)/(49-74) 91/54 mmHg (03/04 0700) FiO2 (%):  [32 %-50 %] 40 % (03/04 0727) Weight:  [71.6 kg (157 lb 13.6 oz)-79.1 kg (174 lb 6.1 oz)] 71.6 kg (157 lb 13.6 oz) (03/04 0500)   Recent Labs Lab 03/08/14 1451 03/08/14 1516 03/08/14 2152 03/08/14 2327 03/09/14 0349  GLUCAP 116* 106* 103* 90 138*    Recent Labs Lab 03/08/14 1448 03/08/14 1545 03/08/14 1553 03/08/14 2218 03/09/14 0450  NA 143 143 142 141 143  142  K 5.1 3.4* 4.7 3.0* 3.1*  3.0*  CL 105 103 101 106 108  109  CO2 31  --   --  29 25  26   GLUCOSE 123* 116* 120* 143* 134*  137*  BUN 13 12 16 8 8  7   CREATININE 0.76 0.70 0.70 0.55 0.55  0.53  CALCIUM 9.0  --   --  8.4 8.3*  8.2*  MG  --   --   --   --  1.8  PHOS  --   --   --   --  4.2  4.2    Recent  Labs Lab 03/08/14 1448 03/09/14 0450  AST 50*  --   ALT 18  --   ALKPHOS 63  --   BILITOT 1.8*  --   PROT 6.4  --   ALBUMIN 3.3* 2.5*    Recent Labs Lab 03/08/14 1448 03/08/14 1545 03/08/14 1553 03/08/14 2218 03/09/14 0450  WBC 8.7  --   --  8.5 11.3*  NEUTROABS 6.6  --   --   --   --   HGB 12.9 14.6 15.0 11.3* 10.4*  HCT 40.4 43.0 44.0 34.8* 32.6*  MCV 94.6  --   --  91.1 91.6  PLT 409*  --   --  300 287   No results for input(s): CKTOTAL, CKMB, CKMBINDEX, TROPONINI in the last 168 hours.  Recent Labs  03/08/14 1448  LABPROT 15.1  INR 1.18    Recent Labs  03/08/14 1601   COLORURINE YELLOW  LABSPEC 1.017  PHURINE 7.0  GLUCOSEU NEGATIVE  HGBUR NEGATIVE  BILIRUBINUR NEGATIVE  KETONESUR NEGATIVE  PROTEINUR NEGATIVE  UROBILINOGEN 0.2  NITRITE POSITIVE*  LEUKOCYTESUR SMALL*       Component Value Date/Time   CHOL 95 03/09/2014 0450   TRIG 54 03/09/2014 0450   HDL 31* 03/09/2014 0450   CHOLHDL 3.1 03/09/2014 0450   VLDL 11 03/09/2014 0450   LDLCALC 53 03/09/2014 0450   No results found for: HGBA1C    Component Value Date/Time   LABOPIA NONE DETECTED 03/08/2014 1601   COCAINSCRNUR NONE DETECTED 03/08/2014 1601   LABBENZ NONE DETECTED 03/08/2014 1601   AMPHETMU NONE DETECTED 03/08/2014 1601   THCU NONE DETECTED 03/08/2014 1601   LABBARB NONE DETECTED 03/08/2014 1601     Recent Labs Lab 03/08/14 2218  ETH <5    Ct Head Wo Contrast 03/08/2014    Increased attenuation in the left middle cerebral artery. Early thrombosis in this vessel must be of concern given this appearance. Elsewhere gray-white compartments appear normal. No hemorrhage or mass effect.    Ct Cerebral Perfusion W/cm 03/08/2014    Occlusion left internal carotid artery and left M1 segment.  CT perfusion reveals increased mean transit time in the left MCA territory without significant decrease in cerebral blood volume. Findings suggest a large area of severe ischemia and penumbra without definite core infarct at this time.    Dg Chest Port 1 View 03/08/2014    1. Endotracheal and orogastric tubes are in good position.  2. Tracheal and esophageal displacement from goiter.  3. Stable aeration.     Dg Chest Portable 1 View 03/08/2014    Stable intrathoracic goiter on the left with tracheal deviation.  Increased vascular congestion.     Ct Angio Chest Aorta W/cm &/or Wo/cm 03/08/2014    Positive for acute PE with CT evidence of right heart strain (RV/LV Ratio = 0.9) consistent with at least submassive (intermediate risk)PE. The presence of right heart strain has been associated with an  increased risk of morbidity and mortality. Consultation with Pulmonary and Critical Care Medicine is recommended. The patient is concurrently receiving systemic tPA for a cerebral arterial thrombus as per the emergency room physician.   Large left intrathoracic goiter with displacement of the trachea, aortic branches and superior vena cava. Some chest wall collaterals are noted secondary to the effect on the superior vena cava.  Changes consistent with left internal jugular vein thrombus likely related extrinsic compression on the vein by the goiter. There is some suggestion of right jugular venous thrombosis as well.  MRI pending  2D echo - pending  Venous doppler - DVT noted in the right distal CFV, FV, and pop v. DVT noted in the left FV, pop V. Right jugular vein flow is sluggish, but no thrombus noted. Left jugular vein thrombus appears subacute and is non occlusive.  CUS - Bilateral: 1-39% ICA stenosis. Vertebral artery flow is antegrade. Left side waveforms are pulsatile throughout and thumpy in the ICA suggesting distal occlusion.  PHYSICAL EXAM  Temp:  [97.8 F (36.6 C)-98.7 F (37.1 C)] 98 F (36.7 C) (03/04 1212) Pulse Rate:  [40-102] 100 (03/04 1400) Resp:  [3-30] 14 (03/04 1400) BP: (91-127)/(45-74) 118/72 mmHg (03/04 1400) SpO2:  [93 %-100 %] 99 % (03/04 1400) Arterial Line BP: (87-162)/(49-74) 106/49 mmHg (03/04 1100) FiO2 (%):  [40 %-50 %] 40 % (03/04 1212) Weight:  [157 lb 13.6 oz (71.6 kg)-174 lb 6.1 oz (79.1 kg)] 157 lb 13.6 oz (71.6 kg) (03/04 0500)  General - Well nourished, well developed, intubated on low dose fentanyl and not following commands.  Ophthalmologic - fundi not visulized.  Cardiovascular - Regular rate and rhythm.  Neuro - intubated but open eyes on voice and light, not able to follow simple command. PERRL, left preference, but able to cross midline, blinking to visual threat on the left but not on the right, CN VII and XII difficult to  assess due to ET tube. Right hemiplegic with UE 0/5 and LE 1/5 on pain stimulation, LUE and LLE spontaneous movement. Reflex 1+, babinski negative bilaterally. Coordination and gait not tested.  ASSESSMENT/PLAN Sandy Barrett is a 81 y.o. female with history of hypertension, hyperlipidemia and hyperthyroidism, hyperthyroidism, migraine headaches, presenting with speech difficulties, right hemiparesis, confusion, right hemianopsia, and right facial droop. She did receive IV t-PA 67 mg on 03/08/2014 at 1615 for the above-noted deficits. She was subsequently taken to the interventional radiology suite; however, revascularization attempts were unsuccessful.  Stroke:  Dominant left MCA territory infarcts secondary to occlusion of the left internal carotid artery. Highly suspicious for paradoxical emboli.   Resultant  Aphasia and right hemiplegia  MRI  pending  MRA  Pending  Carotid Doppler  Left ICA distal occlusion  Lower extremity venous Dopplers - extensive DVT b/l LEs  2D Echo  pending  LDL 53  HgbA1c pending  Hypercoagulable work up pending  SCDs for VTE prophylaxis  Diet NPO time specified no liquids  no antithrombotic prior to admission, now on no antithrombotic status post TPA administration. Will consider low intensity heparin drip if MRI did not show large MCA infarcts and 24h off tPA.  Ongoing aggressive stroke risk factor management  Therapy recommendations: Pending  Disposition: Pending  Massive PE and bilateral extensive LE DVTs  Found on CT chest and venous doppler  Etiology unclear and hypercoagulable work up pending  Huge goiter could contribute to jugular vein thrombosis but not able to explain LE DVTs  Will consider  low intensity heparin drip if MRI did not show large MCA infarcts and 24h off tPA  Large goiter   Found on CT chest and CXR  Chronic process  Contribute to jugular vein subacute thrombosis  Consider surgery consult once pt  stabilized.  Respiratory failure  Intubated  CCM on board  Extubate when able  Hypertension  Home meds: Hydrochlorothiazide 25 mg daily and Inderal 20 mg twice daily  Blood pressure running somewhat low  Permissive hypertension <220/120 for 24-48 hours and then gradually normalize within 5-7 days  Hold off BP meds  for now  Other Stroke Risk Factors  Advanced age  Migraines  Other Active Problems  Acute PE with CT evidence of right heart strain   Large left intrathoracic goiter - question malignant.  Left internal jugular vein thrombus likely related extrinsic compression on the vein by the goiter.   Hypokalemia  Anemia   Other Pertinent History    Hospital day # 1  This patient is critically ill due to massive PE, extensive DVTs and left ICA occlusion with left MCA infarcts and at significant risk of neurological worsening, death form extension of infarcts, cerebral herniation, further progressive PE, heart failure, hemorrhagic transformation in the brain. This patient's care requires constant monitoring of vital signs, hemodynamics, respiratory and cardiac monitoring, review of multiple databases, neurological assessment, discussion with family, other specialists and medical decision making of high complexity. I spent 55 minutes of neurocritical care time in the care of this patient and in discussion with pt families. They expressed understanding and willing to take the risk to start heparin IV for DVT treatment.  Rosalin Hawking, MD PhD Stroke Neurology 03/09/2014 3:59 PM  To contact Stroke Continuity provider, please refer to [email or web address] After hours, contact General Neurology

## 2014-03-09 NOTE — Progress Notes (Signed)
66F sheath removed exoseal used no complications pressure dressing applied.  Groin reviewed with RN Elmyra Ricks sand bag applied.

## 2014-03-09 NOTE — Progress Notes (Signed)
OT Cancellation Note  Patient Details Name: Makenleigh Crownover MRN: [id number] DOB: 12-25-1932   Cancelled Treatment:    Reason Eval/Treat Not Completed: Medical issues which prohibited therapy. Pt on strict bedrest initially and now sheath has been pulled so on bedrest for sure for at least 4 hours, pt has Bil PEs and Bil LE DVTs. Will wait for activity clarification before proceeding with eval and treatment.  Almon Register [id number] 03/09/2014, 10:46 AM

## 2014-03-09 NOTE — Progress Notes (Signed)
UR completed.  Burnette Valenti, RN BSN [facility] CCM Trauma/Neuro ICU Case Manager [phone]  

## 2014-03-09 NOTE — Progress Notes (Signed)
No asprin at this time per Dr. Erlinda Hong.

## 2014-03-09 NOTE — Progress Notes (Signed)
PT Cancellation Note  Patient Details Name: Sandy Barrett MRN: [id number] DOB: 07/28/1932   Cancelled Treatment:    Reason Eval/Treat Not Completed: Medical issues which prohibited therapy (pt with bil PE and remains intubated. Will attempt next date)   Melford Aase 03/09/2014, 7:08 AM Elwyn Reach, [place]

## 2014-03-09 NOTE — Progress Notes (Addendum)
ANTICOAGULATION CONSULT NOTE - Initial Consult  Pharmacy Consult for heparin Indication: Bilateral PE and multiple DVT  No Known Allergies  Patient Measurements: Height: 5\' 5"  (165.1 cm) Weight: 157 lb 13.6 oz (71.6 kg) IBW/kg (Calculated) : 57 Heparin Dosing Weight: 71 kg  Vital Signs: Temp: 98 F (36.7 C) (03/04 1212) Temp Source: Oral (03/04 1212) BP: 118/72 mmHg (03/04 1400) Pulse Rate: 100 (03/04 1400)  Labs:  Recent Labs  03/08/14 1448  03/08/14 1553 03/08/14 2218 03/09/14 0450  HGB 12.9  < > 15.0 11.3* 10.4*  HCT 40.4  < > 44.0 34.8* 32.6*  PLT 409*  --   --  300 287  APTT 32  --   --   --   --   LABPROT 15.1  --   --   --   --   INR 1.18  --   --   --   --   CREATININE 0.76  < > 0.70 0.55 0.55  0.53  < > = values in this interval not displayed.  Estimated Creatinine Clearance: 56.5 mL/min (by C-G formula based on Cr of 0.53).   Medical History: Past Medical History  Diagnosis Date  . HYPERTENSION   . HYPERTHYROIDISM   . OBESITY   . ARTHRITIS   . Posttraumatic stress disorder   . INSOMNIA   . MIGRAINE HEADACHE   . Hyperthyroidism     s/p I-131 ablation 03/2011 of multinod goiter  . Arthritis     Medications:  See EMR  Assessment: 81 yo female with L MCA infarct, massive bilateral PE, multiple bilateral DVTs. Received systemic tPA yesterday ~1600. H/h 10.4/32.6, hgb down from 15 on admission, plts wnl.     Goal of Therapy:  Heparin level 0.25-0.35 units/ml Monitor platelets by anticoagulation protocol: Yes   Plan:  Heparin 900 units/hr Daily HL, CBC Monitor for s/sx bleeding F/u hypercoaguable workup No boluses HL this evening     Hughes Better, PharmD, BCPS Clinical Pharmacist Pager: [phone] 03/09/2014 3:09 PM

## 2014-03-09 NOTE — Procedures (Signed)
Extubation Procedure Note  Patient Details:   Name: Sandy Barrett DOB: 01-21-1932 MRN: [id number]   Airway Documentation:     Evaluation  O2 sats: stable throughout Complications: No apparent complications Patient did tolerate procedure well. Bilateral Breath Sounds: Clear Suctioning: Airway, Oral No, unable to speak at this time due to stroke   Gonzella Lex 03/09/2014, 4:00 PM

## 2014-03-09 NOTE — Consult Note (Signed)
PULMONARY / CRITICAL CARE MEDICINE   Name: Sandy Barrett MRN: [id number] DOB: 10-07-1932    ADMISSION DATE:  03/08/2014 CONSULTATION DATE:  03/08/2014  REFERRING MD :  Rancour  CHIEF COMPLAINT:  [place], PE  INITIAL PRESENTATION:  81 yo female with Lt MCA CVA also found to have b/l PE.   STUDIES:  3/3 CT angio chest >> bilateral PE with RV/LV ratio 0.9, large left intrathoracic goiter with displacement of trachea, left IJ thrombus 3/3 Cerebral perfusion study >> occlusion of left internal carotid artery and M1 segment, suggest large area L MCA distribution stroke 3/3 CT head >> increased attenuation L MCA  SIGNIFICANT EVENTS: 3/03 admit, tPA at 430 pm, attempted neuro-IR intervention  SUBJECTIVE:  Tolerating pressure support  VITAL SIGNS: Temp:  [97.8 F (36.6 C)-98.7 F (37.1 C)] 98.7 F (37.1 C) (03/04 0827) Pulse Rate:  [40-102] 102 (03/04 0827) Resp:  [3-30] 30 (03/04 0827) BP: (91-170)/(45-110) 115/64 mmHg (03/04 0827) SpO2:  [90 %-100 %] 100 % (03/04 0827) Arterial Line BP: (87-162)/(49-74) 91/54 mmHg (03/04 0700) FiO2 (%):  [32 %-50 %] 40 % (03/04 0827) Weight:  [157 lb 13.6 oz (71.6 kg)-174 lb 6.1 oz (79.1 kg)] 157 lb 13.6 oz (71.6 kg) (03/04 0500) VENTILATOR SETTINGS: Vent Mode:  [-] PSV;CPAP FiO2 (%):  [32 %-50 %] 40 % Set Rate:  [14 bmp] 14 bmp Vt Set:  [420 mL] 420 mL PEEP:  [5 cmH20] 5 cmH20 Pressure Support:  [5 cmH20] 5 cmH20 Plateau Pressure:  [12 cmH20-17 cmH20] 17 cmH20 INTAKE / OUTPUT:  Intake/Output Summary (Last 24 hours) at 03/09/14 0934 Last data filed at 03/09/14 0700  Gross per 24 hour  Intake 1845.26 ml  Output   1075 ml  Net 770.26 ml    PHYSICAL EXAMINATION: General: no distress Neuro: opens eyes with stimulation HEENT: ETT in place Cardiovascular: regular Lungs: no wheeze Abdomen:  BS+, soft, nontender Musculoskeletal:  Normal bulk and tone Skin:  No rash  LABS:  CBC  Recent Labs Lab 03/08/14 1448  03/08/14 1553  03/08/14 2218 03/09/14 0450  WBC 8.7  --   --  8.5 11.3*  HGB 12.9  < > 15.0 11.3* 10.4*  HCT 40.4  < > 44.0 34.8* 32.6*  PLT 409*  --   --  300 287  < > = values in this interval not displayed.   Coag's  Recent Labs Lab 03/08/14 1448  APTT 32  INR 1.18   BMET  Recent Labs Lab 03/08/14 1448  03/08/14 1553 03/08/14 2218 03/09/14 0450  NA 143  < > 142 141 143  142  K 5.1  < > 4.7 3.0* 3.1*  3.0*  CL 105  < > 101 106 108  109  CO2 31  --   --  29 25  26   BUN 13  < > 16 8 8  7   CREATININE 0.76  < > 0.70 0.55 0.55  0.53  GLUCOSE 123*  < > 120* 143* 134*  137*  < > = values in this interval not displayed.   Electrolytes  Recent Labs Lab 03/08/14 1448 03/08/14 2218 03/09/14 0450  CALCIUM 9.0 8.4 8.3*  8.2*  MG  --   --  1.8  PHOS  --   --  4.2  4.2   ABG  Recent Labs Lab 03/08/14 2153  PHART 7.411  PCO2ART 39.6  PO2ART 160.0*   Liver Enzymes  Recent Labs Lab 03/08/14 1448 03/09/14 0450  AST 50*  --  ALT 18  --   ALKPHOS 63  --   BILITOT 1.8*  --   ALBUMIN 3.3* 2.5*   Glucose  Recent Labs Lab 03/08/14 1451 03/08/14 1516 03/08/14 2152 03/08/14 2327 03/09/14 0349 03/09/14 0826  GLUCAP 116* 106* 103* 90 138* 131*    Imaging Ct Head Wo Contrast  03/08/2014   CLINICAL DATA:  Acute onset right-sided facial droop and aphasia  EXAM: CT HEAD WITHOUT CONTRAST  TECHNIQUE: Contiguous axial images were obtained from the base of the skull through the vertex without intravenous contrast.  COMPARISON:  November 13, 2012  FINDINGS: The ventricles are normal in size and configuration. There is no intracranial mass, hemorrhage, extra-axial fluid collection, or midline shift. The gray and white compartments appear normal. No acute infarct is apparent.  There is increased attenuation in the left middle cerebral artery compared to prior study and compared to the right side.  The bony calvarium appears intact.  The mastoid air cells are clear.  IMPRESSION:  Increased attenuation in the left middle cerebral artery. Early thrombosis in this vessel must be of concern given this appearance. Elsewhere gray-white compartments appear normal. No hemorrhage or mass effect.  Critical Value/emergent results were called by telephone at the time of interpretation on 03/08/2014 at 3:06 pm to Dr. Lacretia Leigh , who verbally acknowledged these results.   Electronically Signed   By: Lowella Grip III M.D.   On: 03/08/2014 15:09   Ct Cerebral Perfusion W/cm  03/08/2014   CLINICAL DATA:  Code stroke.  EXAM: CT CEREBRAL PERFUSION WITH CONTRAST  CONTRAST:  64mL OMNIPAQUE IOHEXOL 350 MG/ML SOLN  COMPARISON:  CT head 03/08/2014  FINDINGS: Left cavernous carotid is occluded. Left M1 segment occluded. Decreased perfusion left MCA territory.  Right carotid and right M1 segment are patent.  Basilar is patent.  CT perfusion reveals significantly increased mean transit time throughout the left temporal and parietal lobe. The insula is involved as well as the caudate. Cerebral blood volume not significantly decreased in the left MCA territory. Cerebral blood flow is decreased in the left MCA territory.  Posterior circulation normal.  Right MCA circulation normal  IMPRESSION: Occlusion left internal carotid artery and left M1 segment.  CT perfusion reveals increased mean transit time in the left MCA territory without significant decrease in cerebral blood volume. Findings suggest a large area of severe ischemia and penumbra without definite core infarct at this time.  Critical Value/emergent results were called by telephone at the time of interpretation on 03/08/2014 at 4:22 pm to Dr. Wallie Char, who verbally acknowledged these results.   Electronically Signed   By: Franchot Gallo M.D.   On: 03/08/2014 16:28   Dg Chest Port 1 View  03/08/2014   CLINICAL DATA:  Intubation  EXAM: PORTABLE CHEST - 1 VIEW  COMPARISON:  03/08/2014  FINDINGS: Interval tracheal intubation with tip at the level of  the mid thoracic trachea. As before, there is marked rightward displacement of the trachea from a large goiter. A nasogastric tube enters the stomach, also distorted by large goiter.  Stable cardiomegaly. No appreciable change in aortic contours. Stable prominent vessels compatible with venous congestion. There is also mild scattered dependent atelectasis. No effusion or air leak.  IMPRESSION: 1. Endotracheal and orogastric tubes are in good position. 2. Tracheal and esophageal displacement from goiter. 3. Stable aeration.   Electronically Signed   By: Monte Fantasia M.D.   On: 03/08/2014 21:26   Dg Chest Portable 1 View  03/08/2014  CLINICAL DATA:  Right-sided facial droop  EXAM: PORTABLE CHEST - 1 VIEW  COMPARISON:  12/12/2010  FINDINGS: Cardiac shadow is stable and enlarged. Intrathoracic goiter is again identified and stable with deviation of the trachea to the right. The lungs are well aerated bilaterally. Mild vascular congestion is noted. No focal confluent infiltrate is seen. Calcifications are noted in the left axilla.  IMPRESSION: Stable intrathoracic goiter on the left with tracheal deviation.  Increased vascular congestion.   Electronically Signed   By: Inez Catalina M.D.   On: 03/08/2014 15:45   Ct Angio Chest Aorta W/cm &/or Wo/cm  03/08/2014   CLINICAL DATA:  Right-sided weakness  EXAM: CT ANGIOGRAPHY CHEST WITH CONTRAST  TECHNIQUE: Multidetector CT imaging of the chest was performed using the standard protocol during bolus administration of intravenous contrast. Multiplanar CT image reconstructions and MIPs were obtained to evaluate the vascular anatomy.  CONTRAST:  100 mL Omnipaque 350  COMPARISON:  12/18/2010  FINDINGS: The lungs demonstrate some mild dependent atelectatic changes bilaterally. No focal confluent infiltrate is seen.  The thoracic inlet demonstrates significant enlargement of the thyroid gland particularly on the left with displacement of the trachea to the right. The overall  appearance is stable from the prior CT examination of 2012. The dimensions of the intrathoracic goiter are stable. There are changes suggestive of thrombus within the left internal jugular vein. There is also poor opacification of the central portion of the right internal jugular vein with opacification superiorly which may also represent a degree of thrombosis. Multiple chest wall collaterals are noted with recruitment of the azygos vein for drainage to the superior vena cava due to mass effect from the goiter.  The thoracic aorta is patent without aneurysmal dilatation. No dissection is seen. The branches of the thoracic aorta are within normal limits although splayed by the intrathoracic goiter. No areas of narrowing are seen. The visualized portions of the carotid arteries bilaterally are patent. The carotid bifurcation on the left is patent.  The pulmonary artery demonstrates central prominence with evidence of significant bilateral pulmonary emboli. These are scattered throughout the upper and lower lobes bilaterally. The right ventricle to left ventricle ratio is 0.9. No left atrial or right atrial thrombus is noted.  Scanning into the upper abdomen reveals cholelithiasis without complicating factors. Left para pelvic cysts are noted. The osseous structures show diffuse degenerative changes of the thoracic spine.  Review of the MIP images confirms the above findings.  IMPRESSION: Positive for acute PE with CT evidence of right heart strain (RV/LV Ratio = 0.9) consistent with at least submassive (intermediate risk)PE. The presence of right heart strain has been associated with an increased risk of morbidity and mortality. Consultation with Pulmonary and Critical Care Medicine is recommended. The patient is concurrently receiving systemic tPA for a cerebral arterial thrombus as per the emergency room physician.  Large left intrathoracic goiter with displacement of the trachea, aortic branches and superior vena  cava. Some chest wall collaterals are noted secondary to the effect on the superior vena cava.  Changes consistent with left internal jugular vein thrombus likely related extrinsic compression on the vein by the goiter. There is some suggestion of right jugular venous thrombosis as well.  Critical Value/emergent results were called by telephone at the time of interpretation on 03/08/2014 at 4:52 pm to Dr. Ezequiel Essex , who verbally acknowledged these results.   Electronically Signed   By: Inez Catalina M.D.   On: 03/08/2014 16:52  ASSESSMENT / PLAN:  NEUROLOGIC A:   Acute L MCA CVA s/p tPA 3/03. P:   Per neurology, neuro-IR Repeat MRI for 3/04  HEMATOLOGIC A:   Concern for hypercoagulable state in setting of CVA and PE. P:  Heparin gtt when okay with neuro/neuro-IR >> will depend on MRI findings in PM of 3/04 F/u hypercoagulable panel from 3/04  PULMONARY A:  Acute respiratory failure with hypoxemia due to PE and sedated on vent for IR procedure. P:   Pressure support wean as tolerated >> defer extubation until neuro status more stable F/u CXR  CARDIOVASCULAR A:   Acute PE. Hx of HTN. P:  F/u Echo, doppler legs Continue inderal, HCTZ  RENAL A:  Hypokalemia. P:   F/u and replace electrolytes as needed  GASTROINTESTINAL A:   Nutrition. P:   Tube feeds if not able to extubate soon Protonix for SUP  INFECTIOUS A:   No acute issues. P:   Monitor clinically  ENDOCRINE A:   Massive goiter >> present since at least 2012. P:   Check TSH  Updated pt's family at bedside and d/w Dr. Erlinda Hong.  CC time 35 minutes.  Chesley Mires, MD [facility] Pulmonary/Critical Care 03/09/2014, 9:39 AM Pager:  [phone] After 3pm call: [phone]

## 2014-03-09 NOTE — Progress Notes (Signed)
INITIAL NUTRITION ASSESSMENT  DOCUMENTATION CODES Per approved criteria  -Not Applicable   INTERVENTION: -If pt remains intubated, recommend initiating Vital 1.2 via OGT @ 25 ml/hr increasing by 10 ml every 4 hours to goal rate of 45 ml/hr providing 1296 kcal, 81 g protein and 876 ml free water  NUTRITION DIAGNOSIS: Inadequate oral intake related to inability to eat as evidenced by NPO status.   Goal: -Pt to meet >/= 90% of estimated needs  Monitor:  -Vent status, TF tolerance/adequacy, weight trends, labs  Reason for Assessment: Initiation/Management of TF  81 y.o. female  Admitting Dx: Stroke  ASSESSMENT: Pt presented to [facility] ED with stroke, transferred to [facility] ED.  PMH of HTN, HLD and hyperthyroidism.  Spoke with pt's daughter.  Reports her appetite has been good prior to stroke and has only noticed minimal weight loss recently that comes with aging.  Per weight records pt has 8 % weight loss in past 6 months (not significant for time frame)  No signs of fat or muscle wasting  Pt currently intubated on ventilator support: MVe: 6.5 Temp (24hrs), Avg:98.1 F (36.7 C), Min:97.8 F (36.6 C), Max:98.7 F (37.1 C)     Height: Ht Readings from Last 1 Encounters:  03/08/14 5\' 5"  (1.651 m)    Weight: Wt Readings from Last 1 Encounters:  03/09/14 157 lb 13.6 oz (71.6 kg)    Ideal Body Weight: 130 lbs (59 kg)  % Ideal Body Weight: 120%  Wt Readings from Last 10 Encounters:  03/09/14 157 lb 13.6 oz (71.6 kg)  02/21/14 163 lb (73.936 kg)  10/03/13 167 lb (75.751 kg)  08/29/13 168 lb (76.204 kg)  08/07/13 171 lb (77.565 kg)  08/01/13 171 lb (77.565 kg)  07/04/13 170 lb (77.111 kg)  06/27/13 168 lb (76.204 kg)  06/20/13 170 lb (77.111 kg)  05/30/13 169 lb (76.658 kg)    Usual Body Weight: n/a  % Usual Body Weight: n/a  BMI:  Body mass index is 26.27 kg/(m^2).  Estimated Nutritional Needs: Kcal: 1249 Protein: 70-95 g protein Fluid: >/= 2.1 L  Skin:  Incision on right groin  Diet Order: Diet NPO time specified  EDUCATION NEEDS: -No education needs identified at this time   Intake/Output Summary (Last 24 hours) at 03/09/14 0948 Last data filed at 03/09/14 0700  Gross per 24 hour  Intake 1845.26 ml  Output   1075 ml  Net 770.26 ml    Last BM: none charted   Labs:   Recent Labs Lab 03/08/14 1448  03/08/14 1553 03/08/14 2218 03/09/14 0450  NA 143  < > 142 141 143  142  K 5.1  < > 4.7 3.0* 3.1*  3.0*  CL 105  < > 101 106 108  109  CO2 31  --   --  29 25  26   BUN 13  < > 16 8 8  7   CREATININE 0.76  < > 0.70 0.55 0.55  0.53  CALCIUM 9.0  --   --  8.4 8.3*  8.2*  MG  --   --   --   --  1.8  PHOS  --   --   --   --  4.2  4.2  GLUCOSE 123*  < > 120* 143* 134*  137*  < > = values in this interval not displayed.  CBG (last 3)   Recent Labs  03/08/14 2327 03/09/14 0349 03/09/14 0826  GLUCAP 90 138* 131*    Scheduled Meds: .  antiseptic oral rinse  7 mL Mouth Rinse QID  . chlorhexidine  15 mL Mouth Rinse BID  . fentaNYL  50 mcg Intravenous Once  . pantoprazole sodium  40 mg Per Tube Q24H    Continuous Infusions: . sodium chloride    . sodium chloride 1,000 mL (03/08/14 2100)  . fentaNYL infusion INTRAVENOUS 75 mcg/hr (03/09/14 0700)    Past Medical History  Diagnosis Date  . HYPERTENSION   . HYPERTHYROIDISM   . OBESITY   . ARTHRITIS   . Posttraumatic stress disorder   . INSOMNIA   . MIGRAINE HEADACHE   . Hyperthyroidism     s/p I-131 ablation 03/2011 of multinod goiter  . Arthritis     Past Surgical History  Procedure Laterality Date  . Abdominal hysterectomy  1976  . Total hip arthroplasty  1998     right  . Tonsillectomy    . Breast surgery      biopsy    Elmer Picker MS Dietetic Intern Pager Number [phone]

## 2014-03-10 ENCOUNTER — Inpatient Hospital Stay (HOSPITAL_COMMUNITY): Payer: Medicare Other

## 2014-03-10 DIAGNOSIS — J9690 Respiratory failure, unspecified, unspecified whether with hypoxia or hypercapnia: Secondary | ICD-10-CM | POA: Insufficient documentation

## 2014-03-10 DIAGNOSIS — M6289 Other specified disorders of muscle: Secondary | ICD-10-CM

## 2014-03-10 DIAGNOSIS — I10 Essential (primary) hypertension: Secondary | ICD-10-CM

## 2014-03-10 DIAGNOSIS — I679 Cerebrovascular disease, unspecified: Secondary | ICD-10-CM

## 2014-03-10 DIAGNOSIS — I639 Cerebral infarction, unspecified: Secondary | ICD-10-CM

## 2014-03-10 DIAGNOSIS — I63311 Cerebral infarction due to thrombosis of right middle cerebral artery: Secondary | ICD-10-CM

## 2014-03-10 DIAGNOSIS — J9600 Acute respiratory failure, unspecified whether with hypoxia or hypercapnia: Secondary | ICD-10-CM

## 2014-03-10 DIAGNOSIS — R938 Abnormal findings on diagnostic imaging of other specified body structures: Secondary | ICD-10-CM

## 2014-03-10 LAB — TSH: TSH: 0.133 u[IU]/mL — ABNORMAL LOW (ref 0.350–4.500)

## 2014-03-10 LAB — HEPARIN LEVEL (UNFRACTIONATED)
Heparin Unfractionated: 0.1 IU/mL — ABNORMAL LOW (ref 0.30–0.70)
Heparin Unfractionated: 0.22 IU/mL — ABNORMAL LOW (ref 0.30–0.70)
Heparin Unfractionated: 0.19 IU/mL — ABNORMAL LOW (ref 0.30–0.70)

## 2014-03-10 LAB — BASIC METABOLIC PANEL
Anion gap: 8 (ref 5–15)
BUN: 7 mg/dL (ref 6–23)
CO2: 26 mmol/L (ref 19–32)
Calcium: 8.1 mg/dL — ABNORMAL LOW (ref 8.4–10.5)
Chloride: 110 mmol/L (ref 96–112)
Creatinine, Ser: 0.57 mg/dL (ref 0.50–1.10)
GFR calc Af Amer: >90 mL/min (ref >90)
GFR calc non Af Amer: 86 mL/min — ABNORMAL LOW (ref >90)
Glucose, Bld: 118 mg/dL — ABNORMAL HIGH (ref 70–99)
Potassium: 2.8 mmol/L — ABNORMAL LOW (ref 3.5–5.1)
Sodium: 144 mmol/L (ref 135–145)

## 2014-03-10 LAB — GLUCOSE, CAPILLARY
Glucose-Capillary: 95 mg/dL (ref 70–99)
Glucose-Capillary: 83 mg/dL (ref 70–99)
Glucose-Capillary: 74 mg/dL (ref 70–99)
Glucose-Capillary: 74 mg/dL (ref 70–99)
Glucose-Capillary: 68 mg/dL — ABNORMAL LOW (ref 70–99)
Glucose-Capillary: 74 mg/dL (ref 70–99)

## 2014-03-10 LAB — MAGNESIUM: Magnesium: 1.9 mg/dL (ref 1.5–2.5)

## 2014-03-10 LAB — HEMOGLOBIN A1C
Hgb A1c MFr Bld: 5.4 % (ref 4.8–5.6)
Mean Plasma Glucose: 108 mg/dL

## 2014-03-10 MED ORDER — SODIUM CHLORIDE 0.9 % IV BOLUS (SEPSIS)
500.0000 mL | Freq: Once | INTRAVENOUS | Status: AC
  Administered 2014-03-10: 500 mL via INTRAVENOUS

## 2014-03-10 MED ORDER — DEXTROSE-NACL 5-0.9 % IV SOLN
INTRAVENOUS | Status: DC
  Administered 2014-03-10: 21:00:00 via INTRAVENOUS

## 2014-03-10 MED ORDER — WHITE PETROLATUM GEL
Status: AC
  Administered 2014-03-10: 0.2
  Filled 2014-03-10: qty 1

## 2014-03-10 MED ORDER — POTASSIUM CHLORIDE 10 MEQ/100ML IV SOLN
10.0000 meq | INTRAVENOUS | Status: AC
  Administered 2014-03-10 (×6): 10 meq via INTRAVENOUS
  Filled 2014-03-10 (×6): qty 100

## 2014-03-10 MED ORDER — POTASSIUM CHLORIDE 20 MEQ/15ML (10%) PO SOLN: 40.0000 meq | ORAL | Status: DC

## 2014-03-10 NOTE — Progress Notes (Signed)
  Echocardiogram 2D Echocardiogram has been performed.  Sandy Barrett 03/10/2014, 11:42 AM

## 2014-03-10 NOTE — Progress Notes (Signed)
ANTICOAGULATION CONSULT NOTE - Follow Up Consult  Pharmacy Consult for heparin Indication: pulmonary embolus and DVT, large MCA infarct  No Known Allergies  Patient Measurements: Height: 5\' 5"  (165.1 cm) Weight: 165 lb 9.1 oz (75.1 kg) IBW/kg (Calculated) : 57  Vital Signs: Temp: 99 F (37.2 C) (03/05 2000) Temp Source: Axillary (03/05 2000) BP: 116/43 mmHg (03/05 2200) Pulse Rate: 78 (03/05 2200)  Labs:  Recent Labs  03/08/14 1448  03/08/14 1553 03/08/14 2218 03/09/14 0450 03/10/14 03/10/14 0219 03/10/14 1200 03/10/14 2050  HGB 12.9  < > 15.0 11.3* 10.4*  --   --   --   --   HCT 40.4  < > 44.0 34.8* 32.6*  --   --   --   --   PLT 409*  --   --  300 287  --   --   --   --   APTT 32  --   --   --   --   --   --   --   --   LABPROT 15.1  --   --   --   --   --   --   --   --   INR 1.18  --   --   --   --   --   --   --   --   HEPARINUNFRC  --   --   --   --   --  0.10*  --  0.22* 0.19*  CREATININE 0.76  < > 0.70 0.55 0.55  0.53  --  0.57  --   --   < > = values in this interval not displayed.  Estimated Creatinine Clearance: 57.8 mL/min (by C-G formula based on Cr of 0.57).   Medications:  Scheduled:  . antiseptic oral rinse  7 mL Mouth Rinse QID  . chlorhexidine  15 mL Mouth Rinse BID  . fentaNYL  50 mcg Intravenous Once  . pantoprazole sodium  40 mg Per Tube Q24H   Infusions:  . dextrose 5 % and 0.9% NaCl 75 mL/hr at 03/10/14 2200  . heparin 1,050 Units/hr (03/10/14 2200)    Assessment: 81 yo female with CVA and also noted with PE and DVT on heparin at 1050 units/hr. Heparin level has decreased on current rate.  Will need to increase infusion rate slightly.  Goal of Therapy:  Heparin level 0.25-0.35 units/ml, per MD Monitor platelets by anticoagulation protocol: Yes   Plan:  Increase heparin infusion to 1150 units/hr Daily heparin level and CBC Will follow anticoagulation plans  [employer], Pharm.D., BCPS Clinical Pharmacist Pager  [phone] 03/10/2014 10:26 PM

## 2014-03-10 NOTE — Evaluation (Signed)
Clinical/Bedside Swallow Evaluation Patient Details  Name: Sandy Barrett MRN: [id number] Date of Birth: 29-Aug-1932  Today's Date: 03/10/2014 Time: SLP Start Time (ACUTE ONLY): 0800 SLP Stop Time (ACUTE ONLY): 0812 SLP Time Calculation (min) (ACUTE ONLY): 12 min  Past Medical History:  Past Medical History  Diagnosis Date  . HYPERTENSION   . HYPERTHYROIDISM   . OBESITY   . ARTHRITIS   . Posttraumatic stress disorder   . INSOMNIA   . MIGRAINE HEADACHE   . Hyperthyroidism     s/p I-131 ablation 03/2011 of multinod goiter  . Arthritis    Past Surgical History:  Past Surgical History  Procedure Laterality Date  . Abdominal hysterectomy  1976  . Total hip arthroplasty  1998     right  . Tonsillectomy    . Breast surgery      biopsy  . Radiology with anesthesia Left 03/08/2014    Procedure: RADIOLOGY WITH ANESTHESIA;  Surgeon: Rob Hickman, MD;  Location: [place];  Service: Radiology;  Laterality: Left;   HPI:  Extubated 3/4. 54 year of female admitted with right sided weakness, facial droop, and difficulty speaking. CT showed a dense left MCA CVA. CT chest showed a large thyroid mass as well as bilateral pulmonary emboli. TPA administered. S/p bilateral common carotid and left vertebral arteriograms, followed by unsuccessful attempt at revascularization. F/u MRI with patchy areas of acute left MCA territory infarction, several punctate foci of acute right MCA territory infarction, possible trace subarachnoid hemorrhage in the high right frontal lobe.PMH of HTN, migraine headache, postraumatic stress disorder.    Assessment / Plan / Recommendation Clinical Impression  Bedside swallow evaluation complete. Patient presents with global aphasia and suspected apraxia impacting function. With maximum contextual and tactile cues, patient able to consume clinician provided po trials without moderate-severe oral phase deficits (decreased awareness of bolus, anterior labial spillage, delayed  oral transit, oral residuals) but with intact initiation of swallow and no overt s/s of aspiration. Suspect that patient may be safe for modified diet consistencies presented with use of compensatory strategies however risk of silent aspiration high given severity of neuro deficits. Will proceed with MBS to instrumentally assess function.     Aspiration Risk  Severe    Diet Recommendation NPO   Medication Administration: Via alternative means    Other  Recommendations Recommended Consults: MBS Oral Care Recommendations:  (QID)   Follow Up Recommendations  Inpatient Rehab    Frequency and Duration        Pertinent Vitals/Pain n/a        Swallow Study    General HPI: Extubated 3/4. 71 year of female admitted with right sided weakness, facial droop, and difficulty speaking. CT showed a dense left MCA CVA. CT chest showed a large thyroid mass as well as bilateral pulmonary emboli. TPA administered. S/p bilateral common carotid and left vertebral arteriograms, followed by unsuccessful attempt at revascularization. F/u MRI with patchy areas of acute left MCA territory infarction, several punctate foci of acute right MCA territory infarction, possible trace subarachnoid hemorrhage in the high right frontal lobe.PMH of HTN, migraine headache, postraumatic stress disorder.  Type of Study: Bedside swallow evaluation Previous Swallow Assessment: none noted Diet Prior to this Study: NPO Temperature Spikes Noted: No Respiratory Status: Room air History of Recent Intubation: Yes Length of Intubations (days): 1 days Date extubated: 03/09/14 Behavior/Cognition: Alert;Cooperative;Requires cueing;Decreased sustained attention (global aphasia and suspected apraxia) Oral Cavity - Dentition: Missing dentition Self-Feeding Abilities: Able to feed self;Needs assist Patient Positioning:  Upright in bed Baseline Vocal Quality:  (non-verbal) Volitional Cough: Cognitively unable to elicit Volitional  Swallow: Unable to elicit    Oral/Motor/Sensory Function Overall Oral Motor/Sensory Function:  (unable to test formally due to aphasia)   Ice Chips Ice chips: Impaired Presentation: Spoon Oral Phase Impairments: Reduced labial seal;Reduced lingual movement/coordination;Impaired anterior to posterior transit;Poor awareness of bolus Oral Phase Functional Implications: Prolonged oral transit   Thin Liquid Thin Liquid: Impaired Presentation: Cup;Straw Oral Phase Impairments: Reduced labial seal;Reduced lingual movement/coordination;Impaired anterior to posterior transit;Poor awareness of bolus Oral Phase Functional Implications: Left anterior spillage Pharyngeal  Phase Impairments: Suspected delayed Swallow    Nectar Thick Nectar Thick Liquid: Not tested   Honey Thick Honey Thick Liquid: Not tested   Puree Puree: Impaired Presentation: Spoon Oral Phase Impairments: Reduced labial seal;Reduced lingual movement/coordination;Impaired anterior to posterior transit;Impaired mastication Oral Phase Functional Implications: Prolonged oral transit;Oral residue Pharyngeal Phase Impairments: Suspected delayed Swallow   Solid   GO   Lanyia Jewel MA, CCC-SLP [phone]  Solid: Not tested       Celestina Gironda Meryl 03/10/2014,8:21 AM

## 2014-03-10 NOTE — Evaluation (Signed)
Physical Therapy Evaluation Patient Details Name: Sandy Barrett MRN: [id number] DOB: [birth year] Today's Date: 03/10/2014   History of Present Illness  Patient here with acute onset of expressive aphasia with right-sided weakness that began approximately 1 hour prior to arrival. She has a history of hypertension, OA, and RTHR  Clinical Impression  Pt admitted with above diagnosis. Pt currently with functional limitations due to the deficits listed below (see PT Problem List). Pt transferred bed to chair with +2 mod A, left gaze preference, no active mvmt noted RUE and minimal RLE. Pt will benefit from skilled PT to increase their independence and safety with mobility to allow discharge to the venue listed below.       Follow Up Recommendations CIR    Equipment Recommendations  Other (comment) (TBD)    Recommendations for Other Services Rehab consult     Precautions / Restrictions Precautions Precautions: Fall Restrictions Weight Bearing Restrictions: No      Mobility  Bed Mobility Overal bed mobility: Needs Assistance;+2 for physical assistance Bed Mobility: Supine to Sit     Supine to sit: Mod assist;+2 for physical assistance     General bed mobility comments: mod A for rolling left and trunk elevation, limited mvmt of right LE due to right knee discomfort (facial grimace)  Transfers Overall transfer level: Needs assistance Equipment used: 2 person hand held assist Transfers: Sit to/from [facility] Sit to Stand: +2 physical assistance;Mod assist Stand pivot transfers: Mod assist;+2 physical assistance       General transfer comment: pt was able to wt-shift fwd and push through LLE>RLE to achieve full standing. Pivoted left foot and extended through left hip to move back toward chair. Mod support given on right side to prevent collapse to that side  Ambulation/Gait             General Gait Details: unable  Stairs             Wheelchair Mobility    Modified Rankin (Stroke Patients Only) Modified Rankin (Stroke Patients Only) Pre-Morbid Rankin Score: No symptoms Modified Rankin: Severe disability     Balance Overall balance assessment: Needs assistance Sitting-balance support: Feet supported;Single extremity supported Sitting balance-Leahy Scale: Poor Sitting balance - Comments: Did lose balance posteriorly x2    Standing balance support: Bilateral upper extremity supported;During functional activity Standing balance-Leahy Scale: Zero Standing balance comment: unable to support right side body on her own                             Pertinent Vitals/Pain Pain Assessment: Faces Faces Pain Scale: Hurts little more Pain Location: right knee with passive movement, crepitus felt in knee Pain Intervention(s): Repositioned;Monitored during session  O2 sats 94% on RA    Home Living Family/patient expects to be discharged to:: Inpatient rehab Living Arrangements: Alone   Type of Home: House Home Access: Stairs to enter Entrance Stairs-Rails: Can reach both Entrance Stairs-Number of Steps: 3 Home Layout: One level Home Equipment: Cane - single point Additional Comments: pt with bilateral knee OA, was having trouble getting up and down stairs but pt was independent, had driven herself to [employer] day of stroke    Prior Function Level of Independence: Independent with assistive device(s)         Comments: Driving     Hand Dominance   Dominant Hand: Right    Extremity/Trunk Assessment   Upper Extremity Assessment: Defer to OT evaluation RUE  Deficits / Details: brunstum 0; PROM WNL except tightness noted for forearm supination; 1 1/2 finger sublux   RUE Sensation: decreased light touch     Lower Extremity Assessment: RLE deficits/detail;LLE deficits/detail;Difficult to assess due to impaired cognition RLE Deficits / Details: no active motion RLE on command however, in  standing, pt did actively extend right hip through partial range for transfer LLE Deficits / Details: difficult to assess due to cognition but when pt's left knee extended, she was able to hold it in full extension, no buckling of left knee in standing and able to pivot left foot in standing  Cervical / Trunk Assessment: Kyphotic  Communication   Communication: Expressive difficulties;Receptive difficulties  Cognition Arousal/Alertness: Awake/alert Behavior During Therapy: WFL for tasks assessed/performed Overall Cognitive Status: Difficult to assess Area of Impairment: Following commands       Following Commands: Follows one step commands inconsistently       General Comments: difficult to assess due to expressive and receptive difficulties, daughters report that pt was cognitively in tact before stroke. Pt followed one step commands 50% of time with multiple multimodal cues     General Comments General comments (skin integrity, edema, etc.): pt followed commands that were familiar tasks such as putting on glasses better than unfamiliar tasks. Left gaze preference but able to look to right with daughter's cues.     Exercises        Assessment/Plan    PT Assessment Patient needs continued PT services  PT Diagnosis Hemiplegia dominant side;Altered mental status;Difficulty walking   PT Problem List Decreased strength;Decreased range of motion;Decreased activity tolerance;Decreased balance;Decreased mobility;Decreased coordination;Decreased cognition;Decreased knowledge of precautions;Decreased knowledge of use of DME;Decreased safety awareness;Impaired sensation;Impaired tone;Pain  PT Treatment Interventions DME instruction;Functional mobility training;Therapeutic activities;Therapeutic exercise;Balance training;Neuromuscular re-education;Cognitive remediation;Patient/family education   PT Goals (Current goals can be found in the Care Plan section) Acute Rehab PT Goals Patient Stated  Goal: daughters hopeful that she can return home PT Goal Formulation: With family Time For Goal Achievement: 03/24/14 Potential to Achieve Goals: Fair    Frequency Min 4X/week   Barriers to discharge Decreased caregiver support      Co-evaluation PT/OT/SLP Co-Evaluation/Treatment: Yes Reason for Co-Treatment: Complexity of the patient's impairments (multi-system involvement);Necessary to address cognition/behavior during functional activity;For patient/therapist safety PT goals addressed during session: Mobility/safety with mobility;Balance;Strengthening/ROM OT goals addressed during session: ADL's and self-care;Strengthening/ROM       End of Session Equipment Utilized During Treatment: Gait belt Activity Tolerance: Patient limited by fatigue Patient left: in chair;with call bell/phone within reach;with family/visitor present Nurse Communication: Mobility status;Need for lift equipment         Time: 0911-0947 PT Time Calculation (min) (ACUTE ONLY): 36 min   Charges:   PT Evaluation $Initial PT Evaluation Tier I: 1 Procedure     PT G Codes:      Leighton Roach, PT  Acute Rehab Services  [place], [place] 03/10/2014, 11:04 AM

## 2014-03-10 NOTE — Progress Notes (Signed)
MBSS complete. Full report located under chart review in imaging section.  Shamonica Schadt MA, CCC-SLP [phone]   

## 2014-03-10 NOTE — Progress Notes (Signed)
Pt urine output 283ml total for shift so far. E-link notified. No new orders given. Will continue to monitor.

## 2014-03-10 NOTE — Progress Notes (Signed)
ANTICOAGULATION CONSULT NOTE - Follow Up Consult  Pharmacy Consult for Heparin  Indication: pulmonary embolus and DVT, large MCA infarct  No Known Allergies  Patient Measurements: Height: 5\' 5"  (165.1 cm) Weight: 157 lb 13.6 oz (71.6 kg) IBW/kg (Calculated) : 57  Vital Signs: Temp: 99.5 F (37.5 C) (03/04 2357) Temp Source: Axillary (03/04 2357) BP: 102/46 mmHg (03/05 0100) Pulse Rate: 94 (03/05 0100)  Labs:  Recent Labs  03/08/14 1448  03/08/14 1553 03/08/14 2218 03/09/14 0450 03/10/14  HGB 12.9  < > 15.0 11.3* 10.4*  --   HCT 40.4  < > 44.0 34.8* 32.6*  --   PLT 409*  --   --  300 287  --   APTT 32  --   --   --   --   --   LABPROT 15.1  --   --   --   --   --   INR 1.18  --   --   --   --   --   HEPARINUNFRC  --   --   --   --   --  0.10*  CREATININE 0.76  < > 0.70 0.55 0.55  0.53  --   < > = values in this interval not displayed.  Estimated Creatinine Clearance: 56.5 mL/min (by C-G formula based on Cr of 0.53).   Assessment: Sub-therapeutic heparin level, no issues per RN, note low goal with stroke.   Goal of Therapy:  Heparin level 0.25-0.35 units/ml, per MD Monitor platelets by anticoagulation protocol: Yes   Plan:  -Increase heparin to 1050 units/hr -0900 HL -Daily CBC/HL -Monitor for bleeding  Narda Bonds 03/10/2014,1:09 AM

## 2014-03-10 NOTE — Progress Notes (Signed)
ANTICOAGULATION CONSULT NOTE - Follow Up Consult  Pharmacy Consult for heparin Indication: pulmonary embolus and DVT, large MCA infarct  No Known Allergies  Patient Measurements: Height: 5\' 5"  (165.1 cm) Weight: 165 lb 9.1 oz (75.1 kg) IBW/kg (Calculated) : 57  Vital Signs: Temp: 98.6 F (37 C) (03/05 0800) Temp Source: Oral (03/05 0800) BP: 130/90 mmHg (03/05 1000) Pulse Rate: 77 (03/05 1100)  Labs:  Recent Labs  03/08/14 1448  03/08/14 1553 03/08/14 2218 03/09/14 0450 03/10/14 03/10/14 0219 03/10/14 1200  HGB 12.9  < > 15.0 11.3* 10.4*  --   --   --   HCT 40.4  < > 44.0 34.8* 32.6*  --   --   --   PLT 409*  --   --  300 287  --   --   --   APTT 32  --   --   --   --   --   --   --   LABPROT 15.1  --   --   --   --   --   --   --   INR 1.18  --   --   --   --   --   --   --   HEPARINUNFRC  --   --   --   --   --  0.10*  --  0.22*  CREATININE 0.76  < > 0.70 0.55 0.55  0.53  --  0.57  --   < > = values in this interval not displayed.  Estimated Creatinine Clearance: 57.8 mL/min (by C-G formula based on Cr of 0.57).   Medications:  Scheduled:  . antiseptic oral rinse  7 mL Mouth Rinse QID  . chlorhexidine  15 mL Mouth Rinse BID  . fentaNYL  50 mcg Intravenous Once  . pantoprazole sodium  40 mg Per Tube Q24H   Infusions:  . sodium chloride    . sodium chloride 75 mL/hr at 03/10/14 1200  . heparin 1,050 Units/hr (03/10/14 0700)    Assessment: 81 yo female with CVA and also noted with PE and DVT on heparin at 1050 units/hr. Heparin level is at goal (HL= 0.22).  Hg/hct= 10.4/32.6 and plt= 287.  Goal of Therapy:  Heparin level 0.25-0.35 units/ml, per MD Monitor platelets by anticoagulation protocol: Yes   Plan:  -No heparin changes needed -Will recheck a heparin level today to confirm -Daily heparin level and CBC -Will follow anticoagulation plans  Hildred Laser, Pharm D 03/10/2014 1:22 PM

## 2014-03-10 NOTE — Progress Notes (Signed)
eLink Physician-Brief Progress Note Patient Name: Caretha Rumbaugh DOB: 17-Jan-1932 MRN: [id number]   Date of Service  03/10/2014  HPI/Events of Note  Hypokalemia  eICU Interventions  Potassium replaced     Intervention Category Intermediate Interventions: Electrolyte abnormality - evaluation and management  Akiba Melfi 03/10/2014, 3:18 AM

## 2014-03-10 NOTE — Evaluation (Addendum)
Occupational Therapy Evaluation Patient Details Name: Sandy Barrett MRN: [id number] DOB: [birth year] Today's Date: 03/10/2014    History of Present Illness Patient here with acute onset of expressive aphasia with right-sided weakness that began approximately 1 hour prior to arrival. She has a history of hypertension, OA, and RTHR   Clinical Impression   This 81 yo female admitted with above presents to acute OT with decreased balance, decreased mobility, decreased vision, inattention to right side, decreased cognition, hemiplegia of dominant side all affecting her ability to care for herself at home at an independent level as she was pta. She will benefit from acute OT with follow up OT on CIR to get to a S level.    Follow Up Recommendations  CIR    Equipment Recommendations   (TBD at next venue)       Precautions / Restrictions Precautions Precautions: Fall Restrictions Weight Bearing Restrictions: No      Mobility Bed Mobility Overal bed mobility: Needs Assistance;+2 for physical assistance Bed Mobility: Supine to Sit     Supine to sit: Mod assist;+2 for physical assistance        Transfers Overall transfer level: Needs assistance Equipment used: 2 person hand held assist Transfers: Sit to/from Stand;Stand Pivot Transfers Sit to Stand: +2 physical assistance;Mod assist Stand pivot transfers: Mod assist;+2 physical assistance            Balance Overall balance assessment: Needs assistance Sitting-balance support: Feet supported;Single extremity supported Sitting balance-Leahy Scale: Poor Sitting balance - Comments: Did lose balance posteriorly x2    Standing balance support: Bilateral upper extremity supported Standing balance-Leahy Scale: Zero                              ADL Overall ADL's : Needs assistance/impaired Eating/Feeding: NPO   Grooming: Maximal assistance;Sitting   Upper Body Bathing: Maximal assistance;Sitting   Lower  Body Bathing: Maximal assistance (with +2 Mod A sit<>stand)   Upper Body Dressing : Total assistance;Sitting   Lower Body Dressing: Total assistance (with +2 Mod A sit<>stand)   Toilet Transfer: Moderate assistance;+2 for physical assistance;Stand-pivot (bed>recliner going to pt's left)   Toileting- Clothing Manipulation and Hygiene: Total assistance (with +2 Mod A sit<>stand)               Vision Additional Comments: wears glasses for near and far; able to put them on by herself with her RUE with S to not poke herself in her right eye with the arm of the glasses. Pt with tendency to keep eyes and head turned to the right--but can turn both to the right with increased cues.          Pertinent Vitals/Pain Pain Assessment: Faces Faces Pain Scale: No hurt     Hand Dominance Right   Extremity/Trunk Assessment Upper Extremity Assessment Upper Extremity Assessment: RUE deficits/detail RUE Deficits / Details: brunstum 0; PROM WNL except tightness noted for forearm supination; 1 1/2 finger sublux RUE Sensation: decreased light touch RUE Coordination: decreased fine motor;decreased gross motor           Communication Communication Communication: Expressive difficulties;Receptive difficulties   Cognition Arousal/Alertness: Awake/alert Behavior During Therapy: WFL for tasks assessed/performed Overall Cognitive Status: Impaired/Different from baseline (difficult to asses extent due to expressive and receptive difficulties)  Home Living Family/patient expects to be discharged to:: Inpatient rehab Living Arrangements: Alone   Type of Home: House Home Access: Stairs to enter Entrance Stairs-Number of Steps: 3 Entrance Stairs-Rails: Can reach both Home Layout: One level               Home Equipment: Cane - single point          Prior Functioning/Environment Level of Independence: Independent        Comments: Driving     OT Diagnosis: Generalized weakness;Cognitive deficits;Disturbance of vision;Hemiplegia dominant side   OT Problem List: Decreased strength;Decreased range of motion;Impaired balance (sitting and/or standing);Decreased safety awareness;Decreased coordination;Impaired vision/perception;Decreased knowledge of precautions;Impaired sensation;Obesity;Impaired UE functional use;Impaired tone   OT Treatment/Interventions: Self-care/ADL training;Neuromuscular education;DME and/or AE instruction;Cognitive remediation/compensation;Therapeutic activities;Balance training;Patient/family education;Visual/perceptual remediation/compensation    OT Goals(Current goals can be found in the care plan section) Acute Rehab OT Goals Patient Stated Goal: Daughters would like to be her get better enough to return home OT Goal Formulation: With patient/family Time For Goal Achievement: 03/24/14 Potential to Achieve Goals: Fair ADL Goals Pt Will Perform Grooming: with min assist;sitting (2 tasks) Pt Will Perform Upper Body Bathing: with min assist;sitting Pt Will Transfer to Toilet: with mod assist;stand pivot transfer;bedside commode Additional ADL Goal #1: Pt will be mod A for up to EOB for BADLs Additional ADL Goal #2: Pt will be able to locate items on table top visual field with mod cues to use during BADL  OT Frequency: Min 3X/week   Barriers to D/C: Decreased caregiver support          Co-evaluation PT/OT/SLP Co-Evaluation/Treatment: Yes Reason for Co-Treatment: For patient/therapist safety   OT goals addressed during session: ADL's and self-care;Strengthening/ROM      End of Session Equipment Utilized During Treatment: Gait belt Nurse Communication: Mobility status;Need for lift equipment  Activity Tolerance: Patient tolerated treatment well Patient left: in chair;with family/visitor present;with call bell/phone within reach   Time: 2353-6144 OT Time Calculation (min): 36 min Charges:  OT  General Charges $OT Visit: 1 Procedure OT Evaluation $Initial OT Evaluation Tier I: 1 Procedure  Almon Register [phone] 03/10/2014, 10:54 AM

## 2014-03-10 NOTE — Evaluation (Signed)
Speech Language Pathology Evaluation Patient Details Name: Sandy Barrett MRN: [id number] DOB: [birth year] Today's Date: 03/10/2014 Time: 0750-0800 SLP Time Calculation (min) (ACUTE ONLY): 10 min  Problem List:  Patient Active Problem List   Diagnosis Date Noted  . Stroke 03/08/2014  . Pulmonary embolism 03/08/2014  . CVA (cerebral infarction)   . Right sided weakness   . Back pain 08/29/2013  . Primary localized osteoarthrosis, lower leg 03/06/2013  . IBS (irritable bowel syndrome)   . 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 Multinodular goiter 01/13/2011  . Abnormal chest x-ray 12/12/2010  . HYPERTHYROIDISM 11/11/2009  . INSOMNIA 07/03/2009  . POSTTRAUMATIC STRESS DISORDER 08/22/2008  . OBESITY 04/21/2008  . OSTEOARTHRITIS 04/21/2008  . MIGRAINE HEADACHE 04/20/2008  . Essential hypertension 04/20/2008   Past Medical History:  Past Medical History  Diagnosis Date  . HYPERTENSION   . HYPERTHYROIDISM   . OBESITY   . ARTHRITIS   . Posttraumatic stress disorder   . INSOMNIA   . MIGRAINE HEADACHE   . Hyperthyroidism     s/p I-131 ablation 03/2011 of multinod goiter  . Arthritis    Past Surgical History:  Past Surgical History  Procedure Laterality Date  . Abdominal hysterectomy  1976  . Total hip arthroplasty  1998     right  . Tonsillectomy    . Breast surgery      biopsy  . Radiology with anesthesia Left 03/08/2014    Procedure: RADIOLOGY WITH ANESTHESIA;  Surgeon: Rob Hickman, MD;  Location: [facility];  Service: Radiology;  Laterality: Left;   HPI:  Extubated 3/4. 32 year of female admitted with right sided weakness, facial droop, and difficulty speaking. CT showed a dense left MCA CVA. CT chest showed a large thyroid mass as well as bilateral pulmonary emboli. TPA administered. S/p bilateral common carotid and left vertebral arteriograms, followed by unsuccessful attempt at revascularization. F/u MRI with patchy areas of acute left MCA territory infarction,  several punctate foci of acute right MCA territory infarction, possible trace subarachnoid hemorrhage in the high right frontal lobe.PMH of HTN, migraine headache, postraumatic stress disorder.    Assessment / Plan / Recommendation Clinical Impression  Cognitive-linguistic evaluation complete. Patient presents with a severe global aphasia and suspected apraxia impacting expressive and receptive language skills. Patient is non-verbal with repetative head nodding, labial opening and closing in response to questions provided. Ability to follow commands minimally improved in the context of a functional task. No blink to threat on the right but able to focus attention visually on both right and left with max verbal cueing. SLP will f/u for therapuetic intervention. Recommend CIR consult.     SLP Assessment  Patient needs continued Speech Lanaguage Pathology Services    Follow Up Recommendations  Inpatient Rehab    Frequency and Duration min 2x/week  2 weeks   Pertinent Vitals/Pain Pain Assessment: Faces Faces Pain Scale: No hurt   SLP Goals  Potential to Achieve Goals (ACUTE ONLY): Good Potential Considerations (ACUTE ONLY): Severity of impairments  SLP Evaluation Prior Functioning  Cognitive/Linguistic Baseline: Information not available   Cognition  Overall Cognitive Status: Impaired/Different from baseline Arousal/Alertness: Awake/alert Orientation Level:  (Unknown, patient with global aphasia) Attention: Focused Focused Attention: Impaired Focused Attention Impairment: Verbal basic;Functional basic Memory: Impaired Memory Impairment: Storage deficit Awareness: Impaired Awareness Impairment: Intellectual impairment Problem Solving: Impaired Problem Solving Impairment: Verbal basic;Functional basic Safety/Judgment: Impaired Comments: requiring use of mittens    Comprehension  Auditory Comprehension Overall Auditory Comprehension: Impaired Yes/No Questions: Impaired Basic  Biographical Questions: 0-25% accurate Commands: Impaired One Step Basic Commands: 0-24% accurate Other Conversation Comments: nods head yes to all questions, commands provided Interfering Components: Attention Visual Recognition/Discrimination Discrimination: Exceptions to [facility] Common Objects: Unable to indentify Reading Comprehension Reading Status: Not tested    Expression Verbal Expression Overall Verbal Expression: Impaired Initiation: Impaired Automatic Speech:  (none) Level of Generative/Spontaneous Verbalization:  (perseveratively opens and closes mouth, otherwise non-verbal) Repetition: Impaired Level of Impairment: Word level Naming: Impairment Responsive: 0-25% accurate Confrontation: Impaired Common Objects: Unable to indentify Convergent: 0-24% accurate Divergent: 0-24% accurate Interfering Components: Attention Non-Verbal Means of Communication: Other (comment) (head nods yes in response to all questions/commands)   Oral / Motor Oral Motor/Sensory Function Overall Oral Motor/Sensory Function:  (unable to fomally assess due to global aphasia) Motor Speech Overall Motor Speech: Impaired Phonation:  (non-verbal) Motor Planning: Impaired Level of Impairment: Word Motor Speech Errors:  (repetative labial opening and closing in response to questio)   [place], CCC-SLP [phone]   Gwenlyn Hottinger Meryl 03/10/2014, 8:31 AM

## 2014-03-10 NOTE — Progress Notes (Signed)
Pt's CBG was 68 and increased to 74 when checked a few minutes later.  Pt showed no signs of hypoglycemia.  Pt is NPO.  MD ([facility]) notified.  Order given to change IVFs to D5NS at 75 cc/hr.  Also, MD instructed to keep SCDs off as pt has BLE DVTs and is on a heparin gtt.  Will continue to monitor.

## 2014-03-10 NOTE — Progress Notes (Signed)
STROKE TEAM PROGRESS NOTE   HISTORY Sandy Barrett is a 82 y.o. female with history of hypertension, hyperlipidemia and hyperthyroidism, brought to the emergency room at [facility] with new onset speech difficulty. Patient was noted to have right-sided weakness and facial droop on arrival in the emergency room. She was last known well at noon when she talked on the phone with her sister and is doing well. She went to a hair appointment today at 1300 hours and the hair dresser immediately noted she was confused and not talking correctly. Her daughter noted difficulty with speech output as well as dysarthria. She was brought to [facility] ED and upon entering ED code stroke was called. Patient was transferred to [facility] ED. CT showed a dense left MCA sign. Currently she is aphasic, right hemiparesis, right hemianopsia. Decision to go to CT for CT perfusion was made. Perfusion study showed a large area of penumbra involving the left MCA territory. tPA was delayed secondary to difficulty determining last known well after multiple phone calls with family. It was determined reliably that patient was last known well at noon. Patient subsequently required CT chest to rule out possible AO aortic dissection due to widened mediastinum seen on chest x-ray. Chest CT not show an aortic aneurysm, nor dissection of aorta. Study, however, did show a large thyroid mass as well as bilateral pulmonary emboli. After determine no aortic dissection, tPA was administered. There was minimal improvement, and patient was subsequently taken to Interventional Radiology for further management   S/P bilateral common carotid and Lt vertebral arteriograms,followed by unsuccessful attempt at revascularization due technical difficulties - Dr Estanislado Pandy.  Date last known well: Date: 03/07/2014 Time last known well: Time: 12:00 noon tPA Given: No: out of window Modified Rankin: Rankin Score=4   SUBJECTIVE (INTERVAL HISTORY) Her  daughters are at the bedside. Overall her condition is unchanged. She is still intubated and on fentanyl. Not following commands, but open eyes on voice. Right hemiplegic. Will need MRI to see the extension of infarct. Waiting for LE venous doppler.   OBJECTIVE Temp:  [98.6 F (37 C)-99.5 F (37.5 C)] 98.6 F (37 C) (03/05 0800) Pulse Rate:  [75-107] 88 (03/05 1231) Cardiac Rhythm:  [-] Normal sinus rhythm (03/05 0800) Resp:  [11-34] 16 (03/05 1231) BP: (84-132)/(32-90) 126/40 mmHg (03/05 1231) SpO2:  [94 %-100 %] 99 % (03/05 1231) Weight:  [165 lb 9.1 oz (75.1 kg)] 165 lb 9.1 oz (75.1 kg) (03/05 0500)   Recent Labs Lab 03/09/14 1941 03/09/14 2336 03/10/14 0339 03/10/14 0822 03/10/14 1220  GLUCAP 112* 111* 95 83 74    Recent Labs Lab 03/08/14 1448 03/08/14 1545 03/08/14 1553 03/08/14 2218 03/09/14 0450 03/10/14 0219  NA 143 143 142 141 143  142 144  K 5.1 3.4* 4.7 3.0* 3.1*  3.0* 2.8*  CL 105 103 101 106 108  109 110  CO2 31  --   --  29 25  26 26   GLUCOSE 123* 116* 120* 143* 134*  137* 118*  BUN 13 12 16 8 8  7 7   CREATININE 0.76 0.70 0.70 0.55 0.55  0.53 0.57  CALCIUM 9.0  --   --  8.4 8.3*  8.2* 8.1*  MG  --   --   --   --  1.8 1.9  PHOS  --   --   --   --  4.2  4.2  --     Recent Labs Lab 03/08/14 1448 03/09/14 0450  AST 50*  --  ALT 18  --   ALKPHOS 63  --   BILITOT 1.8*  --   PROT 6.4  --   ALBUMIN 3.3* 2.5*    Recent Labs Lab 03/08/14 1448 03/08/14 1545 03/08/14 1553 03/08/14 2218 03/09/14 0450  WBC 8.7  --   --  8.5 11.3*  NEUTROABS 6.6  --   --   --   --   HGB 12.9 14.6 15.0 11.3* 10.4*  HCT 40.4 43.0 44.0 34.8* 32.6*  MCV 94.6  --   --  91.1 91.6  PLT 409*  --   --  300 287   No results for input(s): CKTOTAL, CKMB, CKMBINDEX, TROPONINI in the last 168 hours.  Recent Labs  03/08/14 1448  LABPROT 15.1  INR 1.18    Recent Labs  03/08/14 1601  COLORURINE YELLOW  LABSPEC 1.017  PHURINE 7.0  GLUCOSEU NEGATIVE  HGBUR  NEGATIVE  BILIRUBINUR NEGATIVE  KETONESUR NEGATIVE  PROTEINUR NEGATIVE  UROBILINOGEN 0.2  NITRITE POSITIVE*  LEUKOCYTESUR SMALL*       Component Value Date/Time   CHOL 95 03/09/2014 0450   TRIG 54 03/09/2014 0450   HDL 31* 03/09/2014 0450   CHOLHDL 3.1 03/09/2014 0450   VLDL 11 03/09/2014 0450   LDLCALC 53 03/09/2014 0450   Lab Results  Component Value Date   HGBA1C 5.4 03/09/2014      Component Value Date/Time   LABOPIA NONE DETECTED 03/08/2014 1601   COCAINSCRNUR NONE DETECTED 03/08/2014 1601   LABBENZ NONE DETECTED 03/08/2014 1601   AMPHETMU NONE DETECTED 03/08/2014 1601   THCU NONE DETECTED 03/08/2014 1601   LABBARB NONE DETECTED 03/08/2014 1601     Recent Labs Lab 03/08/14 2218  ETH <5    Ct Head Wo Contrast 03/08/2014    Increased attenuation in the left middle cerebral artery. Early thrombosis in this vessel must be of concern given this appearance. Elsewhere gray-white compartments appear normal. No hemorrhage or mass effect.    Ct Cerebral Perfusion W/cm 03/08/2014    Occlusion left internal carotid artery and left M1 segment.  CT perfusion reveals increased mean transit time in the left MCA territory without significant decrease in cerebral blood volume. Findings suggest a large area of severe ischemia and penumbra without definite core infarct at this time.    Dg Chest Port 1 View 03/08/2014    1. Endotracheal and orogastric tubes are in good position.  2. Tracheal and esophageal displacement from goiter.  3. Stable aeration.     Dg Chest Portable 1 View 03/08/2014    Stable intrathoracic goiter on the left with tracheal deviation.  Increased vascular congestion.     Ct Angio Chest Aorta W/cm &/or Wo/cm 03/08/2014    Positive for acute PE with CT evidence of right heart strain (RV/LV Ratio = 0.9) consistent with at least submassive (intermediate risk)PE. The presence of right heart strain has been associated with an increased risk of morbidity and mortality.  Consultation with Pulmonary and Critical Care Medicine is recommended. The patient is concurrently receiving systemic tPA for a cerebral arterial thrombus as per the emergency room physician.   Large left intrathoracic goiter with displacement of the trachea, aortic branches and superior vena cava. Some chest wall collaterals are noted secondary to the effect on the superior vena cava.  Changes consistent with left internal jugular vein thrombus likely related extrinsic compression on the vein by the goiter. There is some suggestion of right jugular venous thrombosis as well.    MRI pending  2D echo - pending  Venous doppler - DVT noted in the right distal CFV, FV, and pop v. DVT noted in the left FV, pop V. Right jugular vein flow is sluggish, but no thrombus noted. Left jugular vein thrombus appears subacute and is non occlusive.  CUS - Bilateral: 1-39% ICA stenosis. Vertebral artery flow is antegrade. Left side waveforms are pulsatile throughout and thumpy in the ICA suggesting distal occlusion.  PHYSICAL EXAM  Temp:  [98.6 F (37 C)-99.5 F (37.5 C)] 98.6 F (37 C) (03/05 0800) Pulse Rate:  [75-107] 88 (03/05 1231) Resp:  [11-34] 16 (03/05 1231) BP: (84-132)/(32-90) 126/40 mmHg (03/05 1231) SpO2:  [94 %-100 %] 99 % (03/05 1231) Weight:  [165 lb 9.1 oz (75.1 kg)] 165 lb 9.1 oz (75.1 kg) (03/05 0500)  General - Well nourished, well developed, intubated on low dose fentanyl and not following commands.  Ophthalmologic - fundi not visulized.  Cardiovascular - Regular rate and rhythm.  Neuro - extubated. drowsy but open eyes on voice and light, not able to follow simple command. PERRL, left preference, but able to cross midline, blinking to visual threat on the left but not on the right, CN VII and XII difficult to assess due to ET tube. Right hemiplegic with UE 0/5 and LE 1/5 on pain stimulation, LUE and LLE spontaneous movement. Reflex 1+, babinski negative bilaterally.  Coordination and gait not tested.  ASSESSMENT/PLAN Sandy Barrett is a 81 y.o. female with history of hypertension, hyperlipidemia and hyperthyroidism, hyperthyroidism, migraine headaches, presenting with speech difficulties, right hemiparesis, confusion, right hemianopsia, and right facial droop. She did receive IV t-PA 67 mg on 03/08/2014 at 1615 for the above-noted deficits. She was subsequently taken to the interventional radiology suite; however, revascularization attempts were unsuccessful.  Stroke:  Dominant left MCA territory infarcts secondary to occlusion of the left internal carotid artery. Highly suspicious for paradoxical emboli.   Resultant  Aphasia and right hemiplegia  MRI  pending  MRA  Pending  Carotid Doppler  Left ICA distal occlusion  Lower extremity venous Dopplers - extensive DVT b/l LEs  2D Echo  pending  LDL 53  HgbA1c pending  Hypercoagulable work up pending  SCDs for VTE prophylaxis  Diet NPO time specified no liquids  no antithrombotic prior to admission, now on no antithrombotic status post TPA administration. Will consider low intensity heparin drip if MRI did not show large MCA infarcts and 24h off tPA.  Ongoing aggressive stroke risk factor management  Therapy recommendations: Pending  Disposition: Pending  Massive PE and bilateral extensive LE DVTs  Found on CT chest and venous doppler  Etiology unclear and hypercoagulable work up pending  Huge goiter could contribute to jugular vein thrombosis but not able to explain LE DVTs  Will consider  low intensity heparin drip if MRI did not show large MCA infarcts and 24h off tPA  Large goiter   Found on CT chest and CXR  Chronic process  Contribute to jugular vein subacute thrombosis  Consider surgery consult once pt stabilized.  Respiratory failure  Intubated  CCM on board  Extubate when able  Hypertension  Home meds: Hydrochlorothiazide 25 mg daily and Inderal 20 mg  twice daily  Blood pressure running somewhat low  Permissive hypertension <220/120 for 24-48 hours and then gradually normalize within 5-7 days  Hold off BP meds for now  Other Stroke Risk Factors  Advanced age  Migraines  Other Active Problems  Acute PE with CT evidence of right heart  strain   Large left intrathoracic goiter - question malignant.  Left internal jugular vein thrombus likely related extrinsic compression on the vein by the goiter.   Hypokalemia  Anemia   Other Pertinent History    Hospital day # 2  This patient is critically ill due to massive PE, extensive DVTs and left ICA occlusion with left MCA infarcts and at significant risk of neurological worsening, death form extension of infarcts, cerebral herniation, further progressive PE, heart failure, hemorrhagic transformation in the brain. This patient's care requires constant monitoring of vital signs, hemodynamics, respiratory and cardiac monitoring, review of multiple databases, neurological assessment, discussion with family, other specialists and medical decision making of high complexity. I spent 35 minutes of neurocritical care time in the care of this patient and in discussion with pt families. I spoke to the patient's daughters regarding her unfortunate situation and significant risk for bleeding on heparin, neurological worsening and dying from pulmonary embolism. Family understands the risk and are in agreement with the current plan. Plan to check swallow eval today and if she fails she may need panda tube from medicines and feeding  Antony Contras, MD Stroke Neurology 03/10/2014 3:08 PM  To contact Stroke Continuity provider, please refer to [email or web address] After hours, contact General Neurology

## 2014-03-10 NOTE — Progress Notes (Signed)
PULMONARY / CRITICAL CARE MEDICINE   Name: Sandy Barrett MRN: [id number] DOB: 1932/07/26    ADMISSION DATE:  03/08/2014 CONSULTATION DATE:  03/08/2014  REFERRING MD :  Rancour  CHIEF COMPLAINT:  Stoke, PE  INITIAL PRESENTATION:  81 yo female with Lt MCA CVA also found to have b/l PE.   STUDIES:  3/3 CT angio chest >> bilateral PE with RV/LV ratio 0.9, large left intrathoracic goiter with displacement of trachea, left IJ thrombus 3/3 Cerebral perfusion study >> occlusion of left internal carotid artery and M1 segment, suggest large area L MCA distribution stroke 3/3 CT head >> increased attenuation L MCA 3/4 LE doppler> DVT R CFV, FV, pop V  SIGNIFICANT EVENTS: 3/03 admit, tPA at 430 pm, attempted neuro-IR intervention 3/5 extubated  SUBJECTIVE: extubated yesterday   VITAL SIGNS: Temp:  [98.6 F (37 C)-99.5 F (37.5 C)] 99.4 F (37.4 C) (03/05 1536) Pulse Rate:  [75-107] 88 (03/05 1231) Resp:  [11-34] 16 (03/05 1231) BP: (84-132)/(32-90) 126/40 mmHg (03/05 1231) SpO2:  [94 %-100 %] 99 % (03/05 1231) Weight:  [75.1 kg (165 lb 9.1 oz)] 75.1 kg (165 lb 9.1 oz) (03/05 0500) VENTILATOR SETTINGS:   INTAKE / OUTPUT:  Intake/Output Summary (Last 24 hours) at 03/10/14 1633 Last data filed at 03/10/14 1300  Gross per 24 hour  Intake 3381.6 ml  Output    710 ml  Net 2671.6 ml    PHYSICAL EXAMINATION: General: awake, no distress HEENT: NCAT, right facial droop PULM: CTA B CV: RRR, no mgr AB: BS+, soft,  Ext: warm, chronic edema Neuro: aphasic, R hemiparesis  LABS:  CBC  Recent Labs Lab 03/08/14 1448  03/08/14 1553 03/08/14 2218 03/09/14 0450  WBC 8.7  --   --  8.5 11.3*  HGB 12.9  < > 15.0 11.3* 10.4*  HCT 40.4  < > 44.0 34.8* 32.6*  PLT 409*  --   --  300 287  < > = values in this interval not displayed.   Coag's  Recent Labs Lab 03/08/14 1448  APTT 32  INR 1.18   BMET  Recent Labs Lab 03/08/14 2218 03/09/14 0450 03/10/14 0219  NA 141 143   142 144  K 3.0* 3.1*  3.0* 2.8*  CL 106 108  109 110  CO2 29 25  26 26   BUN 8 8  7 7   CREATININE 0.55 0.55  0.53 0.57  GLUCOSE 143* 134*  137* 118*     Electrolytes  Recent Labs Lab 03/08/14 2218 03/09/14 0450 03/10/14 0219  CALCIUM 8.4 8.3*  8.2* 8.1*  MG  --  1.8 1.9  PHOS  --  4.2  4.2  --    ABG  Recent Labs Lab 03/08/14 2153  PHART 7.411  PCO2ART 39.6  PO2ART 160.0*   Liver Enzymes  Recent Labs Lab 03/08/14 1448 03/09/14 0450  AST 50*  --   ALT 18  --   ALKPHOS 63  --   BILITOT 1.8*  --   ALBUMIN 3.3* 2.5*   Glucose  Recent Labs Lab 03/09/14 1609 03/09/14 1941 03/09/14 2336 03/10/14 0339 03/10/14 0822 03/10/14 1220  GLUCAP 83 112* 111* 95 83 74    Imaging Mr Brain Wo Contrast  03/09/2014   CLINICAL DATA:  Aphasia, right-sided weakness, and facial droop. Left MCA exclusion. Unsuccessful attempted revascularization.  EXAM: MRI HEAD WITHOUT CONTRAST  MRA HEAD WITHOUT CONTRAST  TECHNIQUE: Multiplanar, multiecho pulse sequences of the brain and surrounding structures were obtained without intravenous contrast. Angiographic  images of the head were obtained using MRA technique without contrast.  COMPARISON:  Head CT, CT perfusion, and cerebral angiogram 03/08/2014  FINDINGS: MRI HEAD FINDINGS  There are patchy areas of acute infarction in the left MCA territory involving the temporal lobe, frontal lobe, parietal lobe, corona radiata, lentiform nucleus, and insula/subinsular white matter. Several scattered, punctate foci of acute infarction are also present involving the right frontal and right parietal cortex in the MCA territory, possibly related to recent catheter angiogram. There is mild cytotoxic edema in the areas of acute infarction without mass effect. There may be trace subarachnoid hemorrhage in the high right frontal lobe.  Ventricles and sulci are normal for age. Scattered foci of T2 hyperintensity in the cerebral white matter outside of the  areas of acute infarct are nonspecific but compatible with mild chronic small vessel ischemic disease. There is no mass, midline shift, or extra-axial fluid collection.  Orbits are unremarkable. Left maxillary sinus mucous retention cyst is noted. Mastoid air cells are clear. Abnormal distal left internal carotid artery and left MCA flow voids are more fully evaluated below.  MRA HEAD FINDINGS  Images are moderately degraded by motion artifact. Visualized distal vertebral arteries are patent and codominant without evidence of significant stenosis. PICA origins and SCA origins are grossly patent. Basilar artery is tortuous proximally. No basilar artery stenosis is seen. PCAs are patent without evidence of significant stenosis. Posterior communicating arteries are not clearly identified.  Right internal carotid artery is patent from skullbase to carotid terminus without evidence of significant stenosis. There is no flow related enhancement in the intracranial left ICA or left MCA, consistent with previously documented occlusion. The left ACA is reconstituted via the anterior communicating artery. Right A1 segment is patent without stenosis. Right M1 segment is patent without significant stenosis identified, although evaluation is limited by motion. Evaluation of right MCA branch vessels is limited by motion.  IMPRESSION: 1. Patchy areas of acute left MCA territory infarction. 2. Several punctate foci of acute right MCA territory infarction. Possible trace subarachnoid hemorrhage in the high right frontal lobe. 3. Occluded left ICA and left MCA.   Electronically Signed   By: Logan Bores   On: 03/09/2014 14:36   Mr Jodene Nam Head/brain Wo Cm  03/09/2014   CLINICAL DATA:  Aphasia, right-sided weakness, and facial droop. Left MCA exclusion. Unsuccessful attempted revascularization.  EXAM: MRI HEAD WITHOUT CONTRAST  MRA HEAD WITHOUT CONTRAST  TECHNIQUE: Multiplanar, multiecho pulse sequences of the brain and surrounding  structures were obtained without intravenous contrast. Angiographic images of the head were obtained using MRA technique without contrast.  COMPARISON:  Head CT, CT perfusion, and cerebral angiogram 03/08/2014  FINDINGS: MRI HEAD FINDINGS  There are patchy areas of acute infarction in the left MCA territory involving the temporal lobe, frontal lobe, parietal lobe, corona radiata, lentiform nucleus, and insula/subinsular white matter. Several scattered, punctate foci of acute infarction are also present involving the right frontal and right parietal cortex in the MCA territory, possibly related to recent catheter angiogram. There is mild cytotoxic edema in the areas of acute infarction without mass effect. There may be trace subarachnoid hemorrhage in the high right frontal lobe.  Ventricles and sulci are normal for age. Scattered foci of T2 hyperintensity in the cerebral white matter outside of the areas of acute infarct are nonspecific but compatible with mild chronic small vessel ischemic disease. There is no mass, midline shift, or extra-axial fluid collection.  Orbits are unremarkable. Left maxillary sinus mucous  retention cyst is noted. Mastoid air cells are clear. Abnormal distal left internal carotid artery and left MCA flow voids are more fully evaluated below.  MRA HEAD FINDINGS  Images are moderately degraded by motion artifact. Visualized distal vertebral arteries are patent and codominant without evidence of significant stenosis. PICA origins and SCA origins are grossly patent. Basilar artery is tortuous proximally. No basilar artery stenosis is seen. PCAs are patent without evidence of significant stenosis. Posterior communicating arteries are not clearly identified.  Right internal carotid artery is patent from skullbase to carotid terminus without evidence of significant stenosis. There is no flow related enhancement in the intracranial left ICA or left MCA, consistent with previously documented  occlusion. The left ACA is reconstituted via the anterior communicating artery. Right A1 segment is patent without stenosis. Right M1 segment is patent without significant stenosis identified, although evaluation is limited by motion. Evaluation of right MCA branch vessels is limited by motion.  IMPRESSION: 1. Patchy areas of acute left MCA territory infarction. 2. Several punctate foci of acute right MCA territory infarction. Possible trace subarachnoid hemorrhage in the high right frontal lobe. 3. Occluded left ICA and left MCA.   Electronically Signed   By: Logan Bores   On: 03/09/2014 14:36     ASSESSMENT / PLAN:  NEUROLOGIC A:   Acute L MCA CVA s/p tPA 3/03 P:   Per neurology, neuro-IR SLP eval today  HEMATOLOGIC A:   Concern for hypercoagulable state in setting of CVA and PE P:  Heparin gtt when okay with neuro/neuro-IR >> will depend on MRI findings in PM of 3/04 F/u hypercoagulable panel from 3/04  PULMONARY A:  Acute respiratory failure with hypoxemia due to PE and sedated on vent for IR procedure> resolved P:   Incentive spirometry NPO  CARDIOVASCULAR A:   Acute PE Hx of HTN P:  Continue inderal, HCTZ  RENAL A:  Hypokalemia P:   F/u and replace electrolytes as needed  GASTROINTESTINAL A:   Nutrition P:   F/u SLP evaluation May need Panda  INFECTIOUS A:   No acute issues P:   Monitor clinically  ENDOCRINE A:   Massive goiter >> present since at least 2012. P:   Check TSH   Roselie Awkward, MD [place] [facility] Pager: [phone] Cell: [phone] If no response, call [phone]

## 2014-03-10 NOTE — Progress Notes (Signed)
Pt blood pressure 96/43 with MAP 57. Neurology MD notified. Orders given for repeat 500cc bolus. Will continue to monitor

## 2014-03-10 NOTE — Progress Notes (Signed)
Pt blood pressure soft (<563 systolic; <14 diastolic). MAP range 59-61. Neurology MD notified. Orders given for 500cc bolus. Will continue to monitor.

## 2014-03-11 ENCOUNTER — Inpatient Hospital Stay (HOSPITAL_COMMUNITY): Payer: Medicare Other

## 2014-03-11 ENCOUNTER — Encounter (HOSPITAL_COMMUNITY): Payer: Self-pay | Admitting: Radiology

## 2014-03-11 DIAGNOSIS — I63412 Cerebral infarction due to embolism of left middle cerebral artery: Secondary | ICD-10-CM | POA: Insufficient documentation

## 2014-03-11 LAB — GLUCOSE, CAPILLARY
Glucose-Capillary: 109 mg/dL — ABNORMAL HIGH (ref 70–99)
Glucose-Capillary: 114 mg/dL — ABNORMAL HIGH (ref 70–99)
Glucose-Capillary: 93 mg/dL (ref 70–99)
Glucose-Capillary: 96 mg/dL (ref 70–99)
Glucose-Capillary: 98 mg/dL (ref 70–99)

## 2014-03-11 LAB — URINE CULTURE
Colony Count: NO GROWTH
Culture: NO GROWTH
Special Requests: NORMAL

## 2014-03-11 LAB — BASIC METABOLIC PANEL
Anion gap: 8 (ref 5–15)
BUN: 8 mg/dL (ref 6–23)
CO2: 22 mmol/L (ref 19–32)
Calcium: 8.5 mg/dL (ref 8.4–10.5)
Chloride: 113 mmol/L — ABNORMAL HIGH (ref 96–112)
Creatinine, Ser: 0.43 mg/dL — ABNORMAL LOW (ref 0.50–1.10)
GFR calc Af Amer: >90 mL/min (ref >90)
GFR calc non Af Amer: >90 mL/min (ref >90)
Glucose, Bld: 108 mg/dL — ABNORMAL HIGH (ref 70–99)
Potassium: 3.1 mmol/L — ABNORMAL LOW (ref 3.5–5.1)
Sodium: 143 mmol/L (ref 135–145)

## 2014-03-11 LAB — CBC
HCT: 28.9 % — ABNORMAL LOW (ref 36.0–46.0)
Hemoglobin: 9.1 g/dL — ABNORMAL LOW (ref 12.0–15.0)
MCH: 29.6 pg (ref 26.0–34.0)
MCHC: 31.5 g/dL (ref 30.0–36.0)
MCV: 94.1 fL (ref 78.0–100.0)
Platelets: 419 10*3/uL — ABNORMAL HIGH (ref 150–400)
RBC: 3.07 MIL/uL — ABNORMAL LOW (ref 3.87–5.11)
RDW: 16.7 % — ABNORMAL HIGH (ref 11.5–15.5)
WBC: 12.5 10*3/uL — ABNORMAL HIGH (ref 4.0–10.5)

## 2014-03-11 LAB — PROTEIN C, TOTAL: Protein C, Total: 70 % (ref 70–140)

## 2014-03-11 LAB — HEPARIN LEVEL (UNFRACTIONATED)
Heparin Unfractionated: 0.26 IU/mL — ABNORMAL LOW (ref 0.30–0.70)
Heparin Unfractionated: 0.33 IU/mL (ref 0.30–0.70)

## 2014-03-11 LAB — HOMOCYSTEINE: Homocysteine: 10.3 umol/L (ref 0.0–15.0)

## 2014-03-11 MED ORDER — CETYLPYRIDINIUM CHLORIDE 0.05 % MT LIQD
7.0000 mL | Freq: Two times a day (BID) | OROMUCOSAL | Status: DC
  Administered 2014-03-12 – 2014-03-13 (×3): 7 mL via OROMUCOSAL

## 2014-03-11 MED ORDER — RESOURCE THICKENUP CLEAR PO POWD
ORAL | Status: DC | PRN
  Administered 2014-03-11: 18:00:00 via ORAL
  Filled 2014-03-11: qty 125

## 2014-03-11 MED ORDER — POTASSIUM CHLORIDE 10 MEQ/100ML IV SOLN
10.0000 meq | INTRAVENOUS | Status: AC
  Administered 2014-03-11 (×4): 10 meq via INTRAVENOUS
  Filled 2014-03-11 (×4): qty 100

## 2014-03-11 MED ORDER — SODIUM CHLORIDE 0.9 % IV SOLN
INTRAVENOUS | Status: DC
  Administered 2014-03-11 – 2014-03-13 (×4): via INTRAVENOUS

## 2014-03-11 NOTE — Progress Notes (Signed)
ANTICOAGULATION CONSULT NOTE - Follow Up Consult  Pharmacy Consult for Heparin  Indication: pulmonary embolus and DVT, large MCA infarct  No Known Allergies  Patient Measurements: Height: 5\' 5"  (165.1 cm) Weight: 165 lb 9.1 oz (75.1 kg) IBW/kg (Calculated) : 57  Vital Signs: Temp: 99.4 F (37.4 C) (03/06 0346) Temp Source: Axillary (03/06 0346) BP: 141/61 mmHg (03/06 0314) Pulse Rate: 77 (03/06 0314)  Labs:  Recent Labs  03/08/14 1448  03/08/14 2218 03/09/14 0450  03/10/14 0219 03/10/14 1200 03/10/14 2050 03/11/14 0301  HGB 12.9  < > 11.3* 10.4*  --   --   --   --  9.1*  HCT 40.4  < > 34.8* 32.6*  --   --   --   --  28.9*  PLT 409*  --  300 287  --   --   --   --  419*  APTT 32  --   --   --   --   --   --   --   --   LABPROT 15.1  --   --   --   --   --   --   --   --   INR 1.18  --   --   --   --   --   --   --   --   HEPARINUNFRC  --   --   --   --   < >  --  0.22* 0.19* 0.26*  CREATININE 0.76  < > 0.55 0.55  0.53  --  0.57  --   --  0.43*  < > = values in this interval not displayed.  Estimated Creatinine Clearance: 57.8 mL/min (by C-G formula based on Cr of 0.43).   Assessment: Therapeutic heparin level, low goal due to stroke  Goal of Therapy:  Heparin level 0.25-0.35 units/ml, per MD Monitor platelets by anticoagulation protocol: Yes   Plan:  -Continue heparin at 1150 units/hr -1200 HL -Daily CBC/HL -Monitor for bleeding  Narda Bonds 03/11/2014,4:36 AM

## 2014-03-11 NOTE — Progress Notes (Signed)
PULMONARY / CRITICAL CARE MEDICINE   Name: Sandy Barrett MRN: [id number] DOB: May 12, 1932    ADMISSION DATE:  03/08/2014 CONSULTATION DATE:  03/08/2014  REFERRING MD :  Rancour  CHIEF COMPLAINT:  Stoke, PE  INITIAL PRESENTATION:  81 yo female with Lt MCA CVA also found to have b/l PE.   STUDIES:  3/3 CT angio chest >> bilateral PE with RV/LV ratio 0.9, large left intrathoracic goiter with displacement of trachea, left IJ thrombus 3/3 Cerebral perfusion study >> occlusion of left internal carotid artery and M1 segment, suggest large area L MCA distribution stroke 3/3 CT head >> increased attenuation L MCA 3/4 LE doppler> DVT R CFV, FV, pop V  SIGNIFICANT EVENTS: 3/03 admit, tPA at 430 pm, attempted neuro-IR intervention 3/5 extubated, passed SLP eval  SUBJECTIVE: passed SLP eval 3/5  VITAL SIGNS: Temp:  [98.5 F (36.9 C)-99.4 F (37.4 C)] 99.4 F (37.4 C) (03/06 0346) Pulse Rate:  [73-99] 93 (03/06 1000) Resp:  [15-26] 21 (03/06 1000) BP: (114-146)/(42-70) 122/70 mmHg (03/06 1000) SpO2:  [93 %-100 %] 98 % (03/06 1000) Weight:  [78.7 kg (173 lb 8 oz)] 78.7 kg (173 lb 8 oz) (03/06 0500) VENTILATOR SETTINGS:   INTAKE / OUTPUT:  Intake/Output Summary (Last 24 hours) at 03/11/14 1447 Last data filed at 03/11/14 1100  Gross per 24 hour  Intake 2163.5 ml  Output    390 ml  Net 1773.5 ml    PHYSICAL EXAMINATION: General: awake, no distress, eating HEENT: NCAT, right facial droop PULM: CTA B CV: RRR, no mgr AB: BS+, soft,  Ext: warm, chronic edema Neuro: aphasic, R hemiparesis  LABS:  CBC  Recent Labs Lab 03/08/14 2218 03/09/14 0450 03/11/14 0301  WBC 8.5 11.3* 12.5*  HGB 11.3* 10.4* 9.1*  HCT 34.8* 32.6* 28.9*  PLT 300 287 419*     Coag's  Recent Labs Lab 03/08/14 1448  APTT 32  INR 1.18   BMET  Recent Labs Lab 03/09/14 0450 03/10/14 0219 03/11/14 0301  NA 143  142 144 143  K 3.1*  3.0* 2.8* 3.1*  CL 108  109 110 113*  CO2 25  26 26  22   BUN 8  7 7 8   CREATININE 0.55  0.53 0.57 0.43*  GLUCOSE 134*  137* 118* 108*     Electrolytes  Recent Labs Lab 03/09/14 0450 03/10/14 0219 03/11/14 0301  CALCIUM 8.3*  8.2* 8.1* 8.5  MG 1.8 1.9  --   PHOS 4.2  4.2  --   --    ABG  Recent Labs Lab 03/08/14 2153  PHART 7.411  PCO2ART 39.6  PO2ART 160.0*   Liver Enzymes  Recent Labs Lab 03/08/14 1448 03/09/14 0450  AST 50*  --   ALT 18  --   ALKPHOS 63  --   BILITOT 1.8*  --   ALBUMIN 3.3* 2.5*   Glucose  Recent Labs Lab 03/10/14 2003 03/10/14 2006 03/11/14 0003 03/11/14 0327 03/11/14 0806 03/11/14 1202  GLUCAP 68* 74 96 98 93 114*    Imaging Dg Chest Port 1 View  03/10/2014   CLINICAL DATA:  Patient with respiratory failure.  EXAM: PORTABLE CHEST - 1 VIEW  COMPARISON:  03/08/2014  FINDINGS: Interval removal of ET tube and enteric tube. Multiple monitoring leads overlie the patient. Stable cardiac and mediastinal contours with persistent displaced of the trachea from a large mass involving the superior mediastinum. Minimal dependent atelectasis. No large pleural effusion or pneumothorax.  IMPRESSION: Interval removal ET and enteric  tubes.  Persistent tracheal deviation from large superior mediastinal mass.   Electronically Signed   By: Lovey Newcomer M.D.   On: 03/10/2014 07:06   Dg Swallowing Func-speech Pathology  03/10/2014    Objective Swallowing Evaluation:   MBS Patient Details  Name: Sandy Barrett MRN: [id number] Date of Birth: Feb 05, 1932  Today's Date: 03/10/2014 Time: SLP Start Time (ACUTE ONLY): 1345-SLP Stop Time (ACUTE ONLY): 1415 SLP Time Calculation (min) (ACUTE ONLY): 30 min  Past Medical History:  Past Medical History  Diagnosis Date  . HYPERTENSION   . HYPERTHYROIDISM   . OBESITY   . ARTHRITIS   . Posttraumatic stress disorder   . INSOMNIA   . MIGRAINE HEADACHE   . Hyperthyroidism     s/p I-131 ablation 03/2011 of multinod goiter  . Arthritis    Past Surgical History:  Past Surgical History   Procedure Laterality Date  . Abdominal hysterectomy  1976  . Total hip arthroplasty  1998     right  . Tonsillectomy    . Breast surgery      biopsy  . Radiology with anesthesia Left 03/08/2014    Procedure: RADIOLOGY WITH ANESTHESIA;  Surgeon: Rob Hickman, MD;   Location: [place];  Service: Radiology;  Laterality: Left;   HPI:  HPI: Extubated 3/4. 55 year of female admitted with right sided weakness,  facial droop, and difficulty speaking. CT showed a dense left MCA CVA. CT  chest showed a large thyroid mass as well as bilateral pulmonary emboli.  TPA administered. S/p bilateral common carotid and left vertebral  arteriograms, followed by unsuccessful attempt at revascularization. F/u  MRI with patchy areas of acute left MCA territory infarction, several  punctate foci of acute right MCA territory infarction, possible trace  subarachnoid hemorrhage in the high right frontal lobe.PMH of HTN,  migraine headache, postraumatic stress disorder.   No Data Recorded  Assessment / Plan / Recommendation CHL IP CLINICAL IMPRESSIONS 03/10/2014  Dysphagia Diagnosis Severe oral phase dysphagia;Moderate pharyngeal phase  dysphagia  Clinical impression MBS complete. Performance during study this pm  consistent with at bedside this am in which patient required maximum  verbal, visual, tactile, and contextual cues for oral awareness of bolus.  Patient with severe oral holding initially, improving as study progressed.  With maximum cueing, patient eventually able to orally transit bolus and  initiate a swallow, penetrating silently with nectar thick liquids,  protecting airway consistently with pureed solids and honey thick liquid  via teaspoon. Daughter present for extensive education regarding results  and recommendations. Given degree of cueing required, patient neither safe  not efficient with pos at this time. With some improvements however in  overall mentation, prognosis for ability to resume a po diet good.  Recommend  temporary non-oral means of nutrition with close SLP f/u at  bedside for po trials (puree, honey thick liquids via teaspoon). Once  functionality of oral transit improves, will initiate this diet.       CHL IP TREATMENT RECOMMENDATION 03/10/2014  Treatment Plan Recommendations Therapy as outlined in treatment plan below      CHL IP DIET RECOMMENDATION 03/10/2014  Diet Recommendations NPO  Liquid Administration via (None)  Medication Administration Via alternative means  Compensations (None)  Postural Changes and/or Swallow Maneuvers (None)     CHL IP OTHER RECOMMENDATIONS 03/10/2014  Recommended Consults (None)  Oral Care Recommendations (No Data)  Other Recommendations (None)     CHL IP FOLLOW UP RECOMMENDATIONS 03/10/2014  Follow up Recommendations Inpatient  Rehab     CHL IP FREQUENCY AND DURATION 03/10/2014  Speech Therapy Frequency (ACUTE ONLY) min 3x week  Treatment Duration 2 weeks         SLP Swallow Goals No flowsheet data found.  No flowsheet data found.    CHL IP REASON FOR REFERRAL 03/10/2014  Reason for Referral Objectively evaluate swallowing function     CHL IP ORAL PHASE 03/10/2014  Lips (None)  Tongue (None)  Mucous membranes (None)  Nutritional status (None)  Other (None)  Oxygen therapy (None)  Oral Phase Impaired  Oral - Pudding Teaspoon (None)  Oral - Pudding Cup (None)  Oral - Honey Teaspoon Left anterior bolus loss;Right anterior bolus  loss;Lingual pumping;Reduced posterior propulsion;Holding of  bolus;Lingual/palatal residue;Delayed oral transit  Oral - Honey Cup (None)  Oral - Honey Syringe (None)  Oral - Nectar Teaspoon Left anterior bolus loss;Right anterior bolus  loss;Lingual pumping;Reduced posterior propulsion;Holding of  bolus;Lingual/palatal residue;Delayed oral transit  Oral - Nectar Cup Left anterior bolus loss;Right anterior bolus  loss;Lingual pumping;Reduced posterior propulsion;Holding of  bolus;Lingual/palatal residue;Delayed oral transit  Oral - Nectar Straw (None)  Oral - Nectar Syringe  (None)  Oral - Ice Chips (None)  Oral - Thin Teaspoon (None)  Oral - Thin Cup (None)  Oral - Thin Straw (None)  Oral - Thin Syringe (None)  Oral - Puree Left anterior bolus loss;Right anterior bolus loss;Lingual  pumping;Reduced posterior propulsion;Holding of bolus;Lingual/palatal  residue;Delayed oral transit  Oral - Mechanical Soft (None)  Oral - Regular (None)  Oral - Multi-consistency (None)  Oral - Pill (None)  Oral Phase - Comment (None)      CHL IP PHARYNGEAL PHASE 03/10/2014  Pharyngeal Phase Impaired  Pharyngeal - Pudding Teaspoon (None)  Penetration/Aspiration details (pudding teaspoon) (None)  Pharyngeal - Pudding Cup (None)  Penetration/Aspiration details (pudding cup) (None)  Pharyngeal - Honey Teaspoon Delayed swallow initiation;Premature spillage  to valleculae;Reduced tongue base retraction  Penetration/Aspiration details (honey teaspoon) (None)  Pharyngeal - Honey Cup (None)  Penetration/Aspiration details (honey cup) (None)  Pharyngeal - Honey Syringe (None)  Penetration/Aspiration details (honey syringe) (None)  Pharyngeal - Nectar Teaspoon Delayed swallow initiation;Premature spillage  to pyriform sinuses;Reduced tongue base retraction;Penetration/Aspiration  during swallow;Penetration/Aspiration before swallow  Penetration/Aspiration details (nectar teaspoon) Material enters airway,  remains ABOVE vocal cords and not ejected out  Pharyngeal - Nectar Cup Delayed swallow initiation;Premature spillage to  pyriform sinuses;Reduced tongue base retraction;Penetration/Aspiration  before swallow;Penetration/Aspiration during swallow  Penetration/Aspiration details (nectar cup) Material enters airway,  remains ABOVE vocal cords and not ejected out  Pharyngeal - Nectar Straw (None)  Penetration/Aspiration details (nectar straw) (None)  Pharyngeal - Nectar Syringe (None)  Penetration/Aspiration details (nectar syringe) (None)  Pharyngeal - Ice Chips (None)  Penetration/Aspiration details (ice chips) (None)   Pharyngeal - Thin Teaspoon (None)  Penetration/Aspiration details (thin teaspoon) (None)  Pharyngeal - Thin Cup (None)  Penetration/Aspiration details (thin cup) (None)  Pharyngeal - Thin Straw (None)  Penetration/Aspiration details (thin straw) (None)  Pharyngeal - Thin Syringe (None)  Penetration/Aspiration details (thin syringe') (None)  Pharyngeal - Puree Delayed swallow initiation;Premature spillage to  valleculae  Penetration/Aspiration details (puree) (None)  Pharyngeal - Mechanical Soft (None)  Penetration/Aspiration details (mechanical soft) (None)  Pharyngeal - Regular (None)  Penetration/Aspiration details (regular) (None)  Pharyngeal - Multi-consistency (None)  Penetration/Aspiration details (multi-consistency) (None)  Pharyngeal - Pill (None)  Penetration/Aspiration details (pill) (None)  Pharyngeal Comment (None)     CHL IP CERVICAL ESOPHAGEAL PHASE 03/10/2014  Cervical Esophageal Phase [facility]  Pudding  Teaspoon (None)  Pudding Cup (None)  Honey Teaspoon (None)  Honey Cup (None)  Honey Syringe (None)  Nectar Teaspoon (None)  Nectar Cup (None)  Nectar Straw (None)  Nectar Syringe (None)  Thin Teaspoon (None)  Thin Cup (None)  Thin Straw (None)  Thin Syringe (None)  Cervical Esophageal Comment (None)    No flowsheet data found.  Gabriel Rainwater MA, CCC-SLP [phone]        McCoy Leah Meryl 03/10/2014, 2:26 PM      ASSESSMENT / PLAN:  NEUROLOGIC A:   Acute L MCA CVA s/p tPA 3/03 P:   Per neurology, neuro-IR PT/OT  HEMATOLOGIC A:   Concern for hypercoagulable state in setting of CVA and PE P:  Heparin gtt when okay with neuro/neuro-IR >> will depend on MRI findings in PM of 3/04 Hypercoagulability panel never sent on 3/4, so send prothrombin gene mutation, factor 5 L and anticardiolipin ab today Ensure age appropriate cancer screening  Consider consulting Dr. Beryle Beams 3/7 to evaluate for clotting disorder  ENDOCRINE A:   Massive goiter >> present since at least 2012 and unchanged but if  no clear cause of hypercoagulable state, does this need a re-biopsy? P:   Check TSH  PULMONARY A:  Acute respiratory failure with hypoxemia due to PE and sedated on vent for IR procedure> resolved P:   Incentive spirometry Aspiration precautions Dysphagia diet  CARDIOVASCULAR A:   Acute PE Hx of HTN P:  Continue inderal, HCTZ  RENAL A:  Hypokalemia P:   F/u and replace electrolytes as needed  GASTROINTESTINAL A:   Nutrition P:   F/u SLP evaluation May need Panda  INFECTIOUS A:   No acute issues P:   Monitor clinically    Roselie Awkward, MD [facility] Pager: [phone] Cell: [phone] If no response, call [phone]

## 2014-03-11 NOTE — Progress Notes (Signed)
eLink Physician-Brief Progress Note Patient Name: Sandy Barrett DOB: 1933/01/03 MRN: [id number]   Date of Service  03/11/2014  HPI/Events of Note  Hypokalemia  eICU Interventions  Potassium replaced     Intervention Category Minor Interventions: Electrolytes abnormality - evaluation and management  Donyel Nester 03/11/2014, 6:14 AM

## 2014-03-11 NOTE — Progress Notes (Signed)
STROKE TEAM PROGRESS NOTE   HISTORY Sandy Barrett is a 81 y.o. female with history of hypertension, hyperlipidemia and hyperthyroidism, brought to the emergency room at [facility] with new onset speech difficulty. Patient was noted to have right-sided weakness and facial droop on arrival in the emergency room. She was last known well at noon when she talked on the phone with her sister and is doing well. She went to a hair appointment today at 1300 hours and the hair dresser immediately noted she was confused and not talking correctly. Her daughter noted difficulty with speech output as well as dysarthria. She was brought to [facility] ED and upon entering ED code stroke was called. Patient was transferred to [facility] ED. CT showed a dense left MCA sign. Currently she is aphasic, right hemiparesis, right hemianopsia. Decision to go to CT for CT perfusion was made. Perfusion study showed a large area of penumbra involving the left MCA territory. tPA was delayed secondary to difficulty determining last known well after multiple phone calls with family. It was determined reliably that patient was last known well at noon. Patient subsequently required CT chest to rule out possible AO aortic dissection due to widened mediastinum seen on chest x-ray. Chest CT not show an aortic aneurysm, nor dissection of aorta. Study, however, did show a large thyroid mass as well as bilateral pulmonary emboli. After determine no aortic dissection, tPA was administered. There was minimal improvement, and patient was subsequently taken to Interventional Radiology for further management   S/P bilateral common carotid and Lt vertebral arteriograms,followed by unsuccessful attempt at revascularization due technical difficulties - Dr Estanislado Pandy.  Date last known well: Date: 03/07/2014 Time last known well: Time: 12:00 noon tPA Given: No: out of window Modified Rankin: Rankin Score=4   SUBJECTIVE (INTERVAL HISTORY) Her  daughters are at the bedside. Overall her condition is unchanged. She is still intubated and on fentanyl. Not following commands, but open eyes on voice. Right hemiplegic. Will need MRI to see the extension of infarct. Waiting for LE venous doppler.   OBJECTIVE Temp:  [98.5 F (36.9 C)-99.4 F (37.4 C)] 99.4 F (37.4 C) (03/06 0346) Pulse Rate:  [73-99] 93 (03/06 1000) Cardiac Rhythm:  [-] Normal sinus rhythm (03/06 0800) Resp:  [15-26] 21 (03/06 1000) BP: (114-146)/(40-70) 122/70 mmHg (03/06 1000) SpO2:  [93 %-100 %] 98 % (03/06 1000) Weight:  [173 lb 8 oz (78.7 kg)] 173 lb 8 oz (78.7 kg) (03/06 0500)   Recent Labs Lab 03/10/14 2003 03/10/14 2006 03/11/14 0003 03/11/14 0327 03/11/14 0806  GLUCAP 68* 74 96 98 93    Recent Labs Lab 03/08/14 1448  03/08/14 1553 03/08/14 2218 03/09/14 0450 03/10/14 0219 03/11/14 0301  NA 143  < > 142 141 143  142 144 143  K 5.1  < > 4.7 3.0* 3.1*  3.0* 2.8* 3.1*  CL 105  < > 101 106 108  109 110 113*  CO2 31  --   --  29 25  26 26 22   GLUCOSE 123*  < > 120* 143* 134*  137* 118* 108*  BUN 13  < > 16 8 8  7 7 8   CREATININE 0.76  < > 0.70 0.55 0.55  0.53 0.57 0.43*  CALCIUM 9.0  --   --  8.4 8.3*  8.2* 8.1* 8.5  MG  --   --   --   --  1.8 1.9  --   PHOS  --   --   --   --  4.2  4.2  --   --   < > = values in this interval not displayed.  Recent Labs Lab 03/08/14 1448 03/09/14 0450  AST 50*  --   ALT 18  --   ALKPHOS 63  --   BILITOT 1.8*  --   PROT 6.4  --   ALBUMIN 3.3* 2.5*    Recent Labs Lab 03/08/14 1448 03/08/14 1545 03/08/14 1553 03/08/14 2218 03/09/14 0450 03/11/14 0301  WBC 8.7  --   --  8.5 11.3* 12.5*  NEUTROABS 6.6  --   --   --   --   --   HGB 12.9 14.6 15.0 11.3* 10.4* 9.1*  HCT 40.4 43.0 44.0 34.8* 32.6* 28.9*  MCV 94.6  --   --  91.1 91.6 94.1  PLT 409*  --   --  300 287 419*   No results for input(s): CKTOTAL, CKMB, CKMBINDEX, TROPONINI in the last 168 hours.  Recent Labs  03/08/14 1448   LABPROT 15.1  INR 1.18    Recent Labs  03/08/14 1601  COLORURINE YELLOW  LABSPEC 1.017  PHURINE 7.0  GLUCOSEU NEGATIVE  HGBUR NEGATIVE  BILIRUBINUR NEGATIVE  KETONESUR NEGATIVE  PROTEINUR NEGATIVE  UROBILINOGEN 0.2  NITRITE POSITIVE*  LEUKOCYTESUR SMALL*       Component Value Date/Time   CHOL 95 03/09/2014 0450   TRIG 54 03/09/2014 0450   HDL 31* 03/09/2014 0450   CHOLHDL 3.1 03/09/2014 0450   VLDL 11 03/09/2014 0450   LDLCALC 53 03/09/2014 0450   Lab Results  Component Value Date   HGBA1C 5.4 03/09/2014      Component Value Date/Time   LABOPIA NONE DETECTED 03/08/2014 1601   COCAINSCRNUR NONE DETECTED 03/08/2014 1601   LABBENZ NONE DETECTED 03/08/2014 1601   AMPHETMU NONE DETECTED 03/08/2014 1601   THCU NONE DETECTED 03/08/2014 1601   LABBARB NONE DETECTED 03/08/2014 1601     Recent Labs Lab 03/08/14 2218  ETH <5    Ct Head Wo Contrast 03/08/2014    Increased attenuation in the left middle cerebral artery. Early thrombosis in this vessel must be of concern given this appearance. Elsewhere gray-white compartments appear normal. No hemorrhage or mass effect.    Ct Cerebral Perfusion W/cm 03/08/2014    Occlusion left internal carotid artery and left M1 segment.  CT perfusion reveals increased mean transit time in the left MCA territory without significant decrease in cerebral blood volume. Findings suggest a large area of severe ischemia and penumbra without definite core infarct at this time.    Dg Chest Port 1 View 03/08/2014    1. Endotracheal and orogastric tubes are in good position.  2. Tracheal and esophageal displacement from goiter.  3. Stable aeration.     Dg Chest Portable 1 View 03/08/2014    Stable intrathoracic goiter on the left with tracheal deviation.  Increased vascular congestion.     Ct Angio Chest Aorta W/cm &/or Wo/cm 03/08/2014    Positive for acute PE with CT evidence of right heart strain (RV/LV Ratio = 0.9) consistent with at least  submassive (intermediate risk)PE. The presence of right heart strain has been associated with an increased risk of morbidity and mortality. Consultation with Pulmonary and Critical Care Medicine is recommended. The patient is concurrently receiving systemic tPA for a cerebral arterial thrombus as per the emergency room physician.   Large left intrathoracic goiter with displacement of the trachea, aortic branches and superior vena cava. Some chest wall collaterals are noted  secondary to the effect on the superior vena cava.  Changes consistent with left internal jugular vein thrombus likely related extrinsic compression on the vein by the goiter. There is some suggestion of right jugular venous thrombosis as well.    MRI 1. Patchy areas of acute left MCA territory infarction. 2. Several punctate foci of acute right MCA territory infarction. Possible trace subarachnoid hemorrhage in the high right frontal lobe. 3. Occluded left ICA and left MCA.  2D echo -Left ventricle: The cavity size was normal. There was mild concentric hypertrophy. Systolic function was vigorous. The estimated ejection fraction was in the range of 65% to 70%.interatrial septal aneurysm noted, bubble study not done  Venous doppler - DVT noted in the right distal CFV, FV, and pop v. DVT noted in the left FV, pop V. Right jugular vein flow is sluggish, but no thrombus noted. Left jugular vein thrombus appears subacute and is non occlusive.  CUS - Bilateral: 1-39% ICA stenosis. Vertebral artery flow is antegrade. Left side waveforms are pulsatile throughout and thumpy in the ICA suggesting distal occlusion.  PHYSICAL EXAM  Temp:  [98.5 F (36.9 C)-99.4 F (37.4 C)] 99.4 F (37.4 C) (03/06 0346) Pulse Rate:  [73-99] 93 (03/06 1000) Resp:  [15-26] 21 (03/06 1000) BP: (114-146)/(40-70) 122/70 mmHg (03/06 1000) SpO2:  [93 %-100 %] 98 % (03/06 1000) Weight:  [173 lb 8 oz (78.7 kg)] 173 lb 8 oz (78.7 kg) (03/06  0500)  General - Well nourished, well developed, intubated on low dose fentanyl and not following commands.  Ophthalmologic - fundi not visulized.  Cardiovascular - Regular rate and rhythm.  Neuro - Awake alert globally aphasic but can follow simple midline and few 1 step commands.   PERRL, left preference, but able to cross midline, blinking to visual threat on the left but not on the right, CN VII and XII difficult to assess due to ET tube. Right hemiplegic with UE 0/5 and LE 1/5 on pain stimulation, LUE and LLE spontaneous movement. Reflex 1+, babinski negative bilaterally. Coordination and gait not tested.  ASSESSMENT/PLAN Sandy Barrett is a 81 y.o. female with history of hypertension, hyperlipidemia and hyperthyroidism, hyperthyroidism, migraine headaches, presenting with speech difficulties, right hemiparesis, confusion, right hemianopsia, and right facial droop. She did receive IV t-PA 67 mg on 03/08/2014 at 1615 for the above-noted deficits. She was subsequently taken to the interventional radiology suite; however, revascularization attempts were unsuccessful.  Stroke:  Dominant left MCA territory infarcts secondary to occlusion of the left internal carotid artery. Highly suspicious for paradoxical emboli.   Resultant  Aphasia and right hemiplegia MRI  1. Patchy areas of acute left MCA territory infarction. 2. Several punctate foci of acute right MCA territory infarction. Possible trace subarachnoid hemorrhage in the high right frontal lobe.  MRA  Occluded left ICA and left MCA  Carotid Doppler  Left ICA distal occlusion  Lower extremity venous Dopplers - extensive DVT b/l LEs  2D Echo  Normal EF. Interatrial septal aneurysm  LDL 53  HgbA1c 5.4  Hypercoagulable work up pending  SCDs for VTE prophylaxis  DIET - DYS 1 no liquids  no antithrombotic prior to admission, now on no antithrombotic status post TPA administration. Will consider low intensity heparin drip  if MRI did not show large MCA infarcts and 24h off tPA.  Ongoing aggressive stroke risk factor management  Therapy recommendations: Pending  Disposition: Pending  Massive PE and bilateral extensive LE DVTs  Found on CT chest and venous doppler  Etiology unclear and hypercoagulable work up pending  Huge goiter could contribute to jugular vein thrombosis but not able to explain LE DVTs  Will consider  low intensity heparin drip if MRI did not show large MCA infarcts and 24h off tPA  Large goiter   Found on CT chest and CXR  Chronic process  Contribute to jugular vein subacute thrombosis  Consider surgery consult once pt stabilized.  Respiratory failure  Intubated  CCM on board  Extubate when able  Hypertension  Home meds: Hydrochlorothiazide 25 mg daily and Inderal 20 mg twice daily  Blood pressure running somewhat low  Permissive hypertension <220/120 for 24-48 hours and then gradually normalize within 5-7 days  Hold off BP meds for now  Other Stroke Risk Factors  Advanced age  Migraines  Other Active Problems  Acute PE with CT evidence of right heart strain   Large left intrathoracic goiter - question malignant.  Left internal jugular vein thrombus likely related extrinsic compression on the vein by the goiter.   Hypokalemia  Anemia   Other Pertinent History    Hospital day # 3    I spoke to the patient's daughters regarding her unfortunate situation and significant risk for bleeding on heparin, neurological worsening and dying from pulmonary embolism. Family understands the risk and are in agreement with the current plan. Plan to transfer to floor today and mobilize out of bed. Therapy and rehab consults. Antony Contras, MD Stroke Neurology 03/11/2014 11:07 AM  To contact Stroke Continuity provider, please refer to [email or web address] After hours, contact General Neurology

## 2014-03-11 NOTE — Progress Notes (Signed)
Speech Language Pathology Treatment: Dysphagia;Cognitive-Linquistic  Patient Details Name: Sandy Barrett MRN: [id number] DOB: Aug 23, 1932 Today's Date: 03/11/2014 Time: 5726-2035 SLP Time Calculation (min) (ACUTE ONLY): 36 min  Assessment / Plan / Recommendation Clinical Impression  Oral function, automaticity and arousal significantly improved since yesterday. Pt up in chair, alert, responding to visual and tactile cues. Able to consume teaspoon bites of honey thick liquids and purees with total assist fading to max hand over hand assist. Oral phase relatively automatic with slight spillage on right, though pt leaning left a bit in the chair which was helpful. Pt may initiate a dys 1 (puree) diet with honey thick given via teaspoon. Instructed daughter on thickening liquids and safe feeding techniques with successful return demonstration. Pt verbalizing in response to cues for automatic speech and repetition, though attempts are jargon and do not approximate targets. Will continue efforts. Recommend CIR at d/c.    HPI HPI: Extubated 3/4. 55 year of female admitted with right sided weakness, facial droop, and difficulty speaking. CT showed a dense left MCA CVA. CT chest showed a large thyroid mass as well as bilateral pulmonary emboli. TPA administered. S/p bilateral common carotid and left vertebral arteriograms, followed by unsuccessful attempt at revascularization. F/u MRI with patchy areas of acute left MCA territory infarction, several punctate foci of acute right MCA territory infarction, possible trace subarachnoid hemorrhage in the high right frontal lobe.PMH of HTN, migraine headache, postraumatic stress disorder.    Pertinent Vitals    SLP Plan  Continue with current plan of care    Recommendations Diet recommendations: Dysphagia 1 (puree);Honey-thick liquid Liquids provided via: Teaspoon Medication Administration: Crushed with puree Supervision: Full supervision/cueing for compensatory  strategies;Trained caregiver to feed patient;Staff to assist with self feeding Compensations: Slow rate;Small sips/bites Postural Changes and/or Swallow Maneuvers: Out of bed for meals;Seated upright 90 degrees              General recommendations: Rehab consult Oral Care Recommendations: Oral care BID Plan: Continue with current plan of care    GO     Sandy Barrett, Katherene Ponto 03/11/2014, 8:49 AM

## 2014-03-11 NOTE — Progress Notes (Signed)
ANTICOAGULATION CONSULT NOTE - Follow Up Consult  Pharmacy Consult for Heparin  Indication: pulmonary embolus and DVT, large MCA infarct  No Known Allergies  Patient Measurements: Height: 5\' 5"  (165.1 cm) Weight: 173 lb 8 oz (78.7 kg) IBW/kg (Calculated) : 57  Vital Signs: Temp: 99.4 F (37.4 C) (03/06 0346) Temp Source: Axillary (03/06 0346) BP: 148/105 mmHg (03/06 1400) Pulse Rate: 92 (03/06 1400)  Labs:  Recent Labs  03/08/14 2218 03/09/14 0450  03/10/14 0219  03/10/14 2050 03/11/14 0301 03/11/14 1323  HGB 11.3* 10.4*  --   --   --   --  9.1*  --   HCT 34.8* 32.6*  --   --   --   --  28.9*  --   PLT 300 287  --   --   --   --  419*  --   HEPARINUNFRC  --   --   < >  --   < > 0.19* 0.26* 0.33  CREATININE 0.55 0.55  0.53  --  0.57  --   --  0.43*  --   < > = values in this interval not displayed.  Estimated Creatinine Clearance: 59.1 mL/min (by C-G formula based on Cr of 0.43).   Assessment: 81 yo female with CVA and also noted with PE and DVT on heparin at 1050 units/hr. Heparin level is noted at goal (HL= 0.33).  Goal of Therapy:  Heparin level 0.25-0.35 units/ml, per MD Monitor platelets by anticoagulation protocol: Yes   Plan:  -No heparin changes needed -Daily heparin level and CBC  Hildred Laser, Pharm D 03/11/2014 3:11 PM

## 2014-03-12 DIAGNOSIS — J9601 Acute respiratory failure with hypoxia: Secondary | ICD-10-CM

## 2014-03-12 LAB — GLUCOSE, CAPILLARY
Glucose-Capillary: 124 mg/dL — ABNORMAL HIGH (ref 70–99)
Glucose-Capillary: 105 mg/dL — ABNORMAL HIGH (ref 70–99)
Glucose-Capillary: 101 mg/dL — ABNORMAL HIGH (ref 70–99)
Glucose-Capillary: 100 mg/dL — ABNORMAL HIGH (ref 70–99)
Glucose-Capillary: 106 mg/dL — ABNORMAL HIGH (ref 70–99)
Glucose-Capillary: 118 mg/dL — ABNORMAL HIGH (ref 70–99)
Glucose-Capillary: 123 mg/dL — ABNORMAL HIGH (ref 70–99)

## 2014-03-12 LAB — OCCULT BLOOD X 1 CARD TO LAB, STOOL: Fecal Occult Bld: NEGATIVE

## 2014-03-12 LAB — CBC
HCT: 27.4 % — ABNORMAL LOW (ref 36.0–46.0)
Hemoglobin: 8.5 g/dL — ABNORMAL LOW (ref 12.0–15.0)
MCH: 29.2 pg (ref 26.0–34.0)
MCHC: 31 g/dL (ref 30.0–36.0)
MCV: 94.2 fL (ref 78.0–100.0)
Platelets: 458 10*3/uL — ABNORMAL HIGH (ref 150–400)
RBC: 2.91 MIL/uL — ABNORMAL LOW (ref 3.87–5.11)
RDW: 16.8 % — ABNORMAL HIGH (ref 11.5–15.5)
WBC: 10.5 10*3/uL (ref 4.0–10.5)

## 2014-03-12 LAB — BETA-2-GLYCOPROTEIN I ABS, IGG/M/A
Beta-2 Glyco I IgG: <9 GPI IgG units (ref 0–20)
Beta-2-Glycoprotein I IgA: <9 GPI IgA units (ref 0–25)
Beta-2-Glycoprotein I IgM: <9 GPI IgM units (ref 0–32)

## 2014-03-12 LAB — HEPARIN LEVEL (UNFRACTIONATED)
Heparin Unfractionated: 0.14 IU/mL — ABNORMAL LOW (ref 0.30–0.70)
Heparin Unfractionated: 0.28 IU/mL — ABNORMAL LOW (ref 0.30–0.70)

## 2014-03-12 LAB — LUPUS ANTICOAGULANT PANEL
DRVVT: 36.8 s (ref 0.0–55.1)
PTT Lupus Anticoagulant: 44.5 s (ref 0.0–50.0)

## 2014-03-12 LAB — PROTEIN C ACTIVITY: Protein C Activity: 93 % (ref 74–151)

## 2014-03-12 LAB — PROTEIN S ACTIVITY: Protein S Activity: 79 % (ref 60–145)

## 2014-03-12 LAB — PROTEIN S, TOTAL: Protein S Ag, Total: 102 % (ref 58–150)

## 2014-03-12 NOTE — Consult Note (Signed)
Physical Medicine and Rehabilitation Consult  Reason for Consult: Right sided weakness, global aphasia and right visual field deficits.  Referring Physician: Dr. Leonie Man.    HPI: Sandy Barrett is a 81 y.o. female with history of HTN, OA, hyperlipidemia who was admitted on 03/08/14 with right sided weakness, difficulty talking and right facial droop. Patient had a hair appointment that day at 1300 hours and the hair dresser immediately noted she was confused and not talking correctly. CT head showed dense left MCA sign and Perfusion study showed a large area of penumbra involving the left MCA territory.  Patient last seen normal at noon that day and tPA administered past CT chest negative for dissection. However incidental note of large thyroid mass as well as bilateral pulmonary emboli. Patient underwent cerebral angiogram with unsuccessful attempts at revascularization due to technical difficulties.  Carotid dopplers with sluggish flow right jugular vein and left jegular vein thrombus--subacute and non-occlusive. BLE dopplers with DVT R-distal CFV, FV, popliteal vein and DVT L-FV and popliteal vein.  Patient started on IV heparin for treatment on 03/04 and tolerated extubated without difficulty. Swallow evaluation with moderate to severe oral phase deficits and NPO recommended. Patient with resultant, right sided weakness, right visual deficits with left gaze preference and severe global aphasia with apraxia. CIR recommended by MD and Rehab team.    ROS    Past Medical History  Diagnosis Date  . HYPERTENSION   . HYPERTHYROIDISM   . OBESITY   . ARTHRITIS   . Posttraumatic stress disorder   . INSOMNIA   . MIGRAINE HEADACHE   . Hyperthyroidism     s/p I-131 ablation 03/2011 of multinod goiter  . Arthritis     Past Surgical History  Procedure Laterality Date  . Abdominal hysterectomy  1976  . Total hip arthroplasty  1998     right  . Tonsillectomy    . Breast surgery     biopsy  . Radiology with anesthesia Left 03/08/2014    Procedure: RADIOLOGY WITH ANESTHESIA;  Surgeon: Rob Hickman, MD;  Location: [facility];  Service: Radiology;  Laterality: Left;    Family History  Problem Relation Age of Onset  . Asthma Mother   . Asthma Father   . Prostate cancer Father     Social History:  reports that she has never smoked. She does not have any smokeless tobacco history on file. She reports that she does not drink alcohol or use illicit drugs.    Allergies: No Known Allergies    Medications Prior to Admission  Medication Sig Dispense Refill  . butalbital-acetaminophen-caffeine (FIORICET, ESGIC) 50-325-40 MG per tablet TAKE 1 TABLET BY MOUTH TWICE A DAY AS NEEDED FOR HEADACHE 40 tablet 0  . diclofenac sodium (VOLTAREN) 1 % GEL Apply 2 g topically 3 (three) times daily. To affected joint. 100 g 11  . esomeprazole (NEXIUM) 40 MG capsule Take 1 capsule (40 mg total) by mouth daily. 90 capsule 0  . hydrochlorothiazide (HYDRODIURIL) 25 MG tablet Take 1 tablet (25 mg total) by mouth daily. 30 tablet 5  . meloxicam (MOBIC) 7.5 MG tablet Take 1 tablet (7.5 mg total) by mouth daily. 30 tablet 5  . polyethylene glycol powder (GLYCOLAX/MIRALAX) powder TAKE 17 G BY MOUTH DAILY. 3350 g 0  . Probiotic Product (ALIGN) 4 MG CAPS Take by mouth daily.      . propranolol (INDERAL) 20 MG tablet Take 1 tablet (20 mg total) by mouth 2 (two) times daily.  60 tablet 5  . traMADol (ULTRAM) 50 MG tablet Take 1 tablet (50 mg total) by mouth 3 (three) times daily as needed. 90 tablet 0  . traZODone (DESYREL) 50 MG tablet Take 1 tablet (50 mg total) by mouth at bedtime as needed for sleep. 30 tablet 1    Home: Home Living Family/patient expects to be discharged to:: Inpatient rehab Living Arrangements: Alone Type of Home: House Home Access: Stairs to enter Entrance Stairs-Number of Steps: 3 Entrance Stairs-Rails: Can reach both Home Layout: One level Home Equipment: Cane - single  point Additional Comments: pt with bilateral knee OA, was having trouble getting up and down stairs but pt was independent, had driven herself to [employer] day of stroke  Functional History: Prior Function Level of Independence: Independent with assistive device(s) Comments: Driving Functional Status:  Mobility: Bed Mobility Overal bed mobility: Needs Assistance, +2 for physical assistance Bed Mobility: Supine to Sit Supine to sit: Mod assist, +2 for physical assistance General bed mobility comments: mod A for rolling left and trunk elevation, limited mvmt of right LE due to right knee discomfort (facial grimace) Transfers Overall transfer level: Needs assistance Equipment used: 2 person hand held assist Transfers: Sit to/from Stand, Stand Pivot Transfers Sit to Stand: +2 physical assistance, Mod assist Stand pivot transfers: Mod assist, +2 physical assistance General transfer comment: pt was able to wt-shift fwd and push through LLE>RLE to achieve full standing. Pivoted left foot and extended through left hip to move back toward chair. Mod support given on right side to prevent collapse to that side Ambulation/Gait General Gait Details: unable    ADL: ADL Overall ADL's : Needs assistance/impaired Eating/Feeding: NPO Grooming: Maximal assistance, Sitting Upper Body Bathing: Maximal assistance, Sitting Lower Body Bathing: Maximal assistance (with +2 Mod A sit<>stand) Upper Body Dressing : Total assistance, Sitting Lower Body Dressing: Total assistance (with +2 Mod A sit<>stand) Toilet Transfer: Moderate assistance, +2 for physical assistance, Stand-pivot (bed>recliner going to pt's left) Toileting- Clothing Manipulation and Hygiene: Total assistance (with +2 Mod A sit<>stand)  Cognition: Cognition Overall Cognitive Status: Difficult to assess Arousal/Alertness: Awake/alert Orientation Level: Other (comment) (UTA; pt with global aphasia.) Attention: Focused Focused  Attention: Impaired Focused Attention Impairment: Verbal basic, Functional basic Memory: Impaired Memory Impairment: Storage deficit Awareness: Impaired Awareness Impairment: Intellectual impairment Problem Solving: Impaired Problem Solving Impairment: Verbal basic, Functional basic Safety/Judgment: Impaired Comments: requiring use of mittens Cognition Arousal/Alertness: Awake/alert Behavior During Therapy: WFL for tasks assessed/performed Overall Cognitive Status: Difficult to assess Area of Impairment: Following commands Following Commands: Follows one step commands inconsistently General Comments: difficult to assess due to expressive and receptive difficulties, daughters report that pt was cognitively in tact before stroke. Pt followed one step commands 50% of time with multiple multimodal cues  Difficult to assess due to: Impaired communication  Blood pressure 102/52, pulse 91, temperature 98.7 F (37.1 C), temperature source Oral, resp. rate 21, height 5\' 5"  (1.651 m), weight 79.6 kg (175 lb 7.8 oz), SpO2 96 %. Physical Exam  Constitutional: No distress.  HENT:  Head: Normocephalic and atraumatic.  Neck: No thyromegaly present.  Cardiovascular: Normal rate.   Respiratory: Effort normal.  GI: She exhibits no distension.  Musculoskeletal: She exhibits no edema or tenderness.  Neurological:  Right facial droop and tongue deviation. Severe expressive greater than receptive aphasia. Usually answers with yes or head nod regardless of question. Senses pain in rght arm and leg. Flexor withdrawal right arm and leg to pain provocation. No voluntary movement of  right limbs. Moves left side spontaneously. Toes down  Skin: Skin is warm. She is not diaphoretic.  Psychiatric:  flat     Results for orders placed or performed during the hospital encounter of 03/08/14 (from the past 24 hour(s))  Glucose, capillary     Status: Abnormal   Collection Time: 03/11/14 12:02 PM  Result Value  Ref Range   Glucose-Capillary 114 (H) 70 - 99 mg/dL  Heparin level (unfractionated)     Status: None   Collection Time: 03/11/14  1:23 PM  Result Value Ref Range   Heparin Unfractionated 0.33 0.30 - 0.70 IU/mL  Glucose, capillary     Status: Abnormal   Collection Time: 03/11/14  3:54 PM  Result Value Ref Range   Glucose-Capillary 109 (H) 70 - 99 mg/dL  Occult blood card to lab, stool     Status: None   Collection Time: 03/11/14 10:02 PM  Result Value Ref Range   Fecal Occult Bld NEGATIVE NEGATIVE  CBC     Status: Abnormal   Collection Time: 03/12/14  4:15 AM  Result Value Ref Range   WBC 10.5 4.0 - 10.5 K/uL   RBC 2.91 (L) 3.87 - 5.11 MIL/uL   Hemoglobin 8.5 (L) 12.0 - 15.0 g/dL   HCT 27.4 (L) 36.0 - 46.0 %   MCV 94.2 78.0 - 100.0 fL   MCH 29.2 26.0 - 34.0 pg   MCHC 31.0 30.0 - 36.0 g/dL   RDW 16.8 (H) 11.5 - 15.5 %   Platelets 458 (H) 150 - 400 K/uL  Heparin level (unfractionated)     Status: Abnormal   Collection Time: 03/12/14  4:15 AM  Result Value Ref Range   Heparin Unfractionated 0.14 (L) 0.30 - 0.70 IU/mL   Ct Head Wo Contrast  03/11/2014   CLINICAL DATA:  Follow-up examination for acute stroke, status post unsuccessful attempt at revascularization.  EXAM: CT HEAD WITHOUT CONTRAST  TECHNIQUE: Contiguous axial images were obtained from the base of the skull through the vertex without intravenous contrast.  COMPARISON:  Prior MRI from 03/09/2014.  FINDINGS: Diffuse hypodensity with blurring of the gray-white matter differentiation seen throughout the left MCA territory, compatible with evolving acute ischemic left MCA territory infarct. There is involvement of the left basal ganglia. Overall, distribution is similar relative to prior MRI, although there is increased cytotoxic edema. There is partial effacement of the left lateral ventricle due to edema without midline shift. No hydrocephalus. There is partial effacement of the left perimesencephalic cistern. Basilar cisterns  otherwise patent. No hemorrhagic transformation within the left MCA territory infarct. Left M1 segment remains dense.  Previously identified small are punctate hit infarcts within the right cerebral hemisphere not well visualized. There is a probable small focus of subarachnoid hemorrhage along the central sulcus of the right cerebral hemisphere (series 201, image 25, stable from prior MRI. No other intracranial hemorrhage.  No new infarct.  No extra-axial fluid collection.  Calvarium intact.  No acute abnormality about the orbits.  Polypoid opacity present within the inferior left maxillary sinus. Mucoperiosteal thickening present within the ethmoidal air cells. Mastoid air cells clear.  IMPRESSION: 1. Interval evolution of acute left MCA territory infarct with increased edema as compared to prior MRI from 03/09/2014. There is mild mass effect on the left lateral ventricle without midline shift. No hemorrhagic transformation. 2. Small focus of probable subarachnoid hemorrhage along the central sulcus of the right cerebral hemisphere, stable from previous MRI. No new hemorrhage. 3. No new infarct or  other acute abnormality.   Electronically Signed   By: Jeannine Boga M.D.   On: 03/11/2014 06:21    Assessment/Plan: Diagnosis: large left MCA territory infarct with dense right hemiparesis and aphasia 1. Does the need for close, 24 hr/day medical supervision in concert with the patient's rehab needs make it unreasonable for this patient to be served in a less intensive setting? Yes 2. Co-Morbidities requiring supervision/potential complications: htn, PE 3. Due to bladder management, bowel management, safety, skin/wound care, disease management, medication administration, pain management and patient education, does the patient require 24 hr/day rehab nursing? Yes 4. Does the patient require coordinated care of a physician, rehab nurse, PT (1-2 hrs/day, 5 days/week), OT (1-2 hrs/day, 5 days/week) and SLP  (1-2 hrs/day, 5 days/week) to address physical and functional deficits in the context of the above medical diagnosis(es)? Yes Addressing deficits in the following areas: balance, endurance, locomotion, strength, transferring, bowel/bladder control, bathing, dressing, feeding, grooming, toileting, cognition, speech, language, swallowing and psychosocial support 5. Can the patient actively participate in an intensive therapy program of at least 3 hrs of therapy per day at least 5 days per week? Yes 6. The potential for patient to make measurable gains while on inpatient rehab is good 7. Anticipated functional outcomes upon discharge from inpatient rehab are min assist and mod assist  with PT, min assist and mod assist with OT, min assist and mod assist with SLP. 8. Estimated rehab length of stay to reach the above functional goals is: 20-28 days 9. Does the patient have adequate social supports and living environment to accommodate these discharge functional goals? Yes 10. Anticipated D/C setting: Home  11. Anticipated post D/C treatments: [facility] therapy 12. Overall Rehab/Functional Prognosis: excellent  RECOMMENDATIONS: This patient's condition is appropriate for continued rehabilitative care in the following setting: CIR Patient has agreed to participate in recommended program. Yes Note that insurance prior authorization may be required for reimbursement for recommended care.  Comment: Rehab Admissions Coordinator to follow up. Family appears supportive and is working on disposition plan  Thanks,  Meredith Staggers, MD, Mellody Drown     03/12/2014

## 2014-03-12 NOTE — Progress Notes (Signed)
Rehab Admissions Coordinator Note:  Patient was screened by Mishell Donalson L for appropriateness for an Inpatient Acute Rehab Consult.  At this time, we are recommending Inpatient Rehab consult.  Addysin Porco L 03/12/2014, 8:52 AM  I can be reached at [phone].

## 2014-03-12 NOTE — Progress Notes (Signed)
ANTICOAGULATION CONSULT NOTE - Follow Up Consult  Pharmacy Consult for Heparin  Indication: pulmonary embolus and DVT  No Known Allergies  Patient Measurements: Height: 5\' 5"  (165.1 cm) Weight: 175 lb 7.8 oz (79.6 kg) IBW/kg (Calculated) : 57  Vital Signs: Temp: 98.8 F (37.1 C) (03/07 1642) Temp Source: Axillary (03/07 1642) BP: 129/64 mmHg (03/07 1642) Pulse Rate: 87 (03/07 1642)  Labs:  Recent Labs  03/10/14 0219  03/11/14 0301 03/11/14 1323 03/12/14 0415 03/12/14 1900  HGB  --   --  9.1*  --  8.5*  --   HCT  --   --  28.9*  --  27.4*  --   PLT  --   --  419*  --  458*  --   HEPARINUNFRC  --   < > 0.26* 0.33 0.14* 0.28*  CREATININE 0.57  --  0.43*  --   --   --   < > = values in this interval not displayed.  Estimated Creatinine Clearance: 59.4 mL/min (by C-G formula based on Cr of 0.43).   Assessment: 81 yo female with CVA and also noted with PE and DVT for heparin.  Follow-up HL was drawn late, but is therapeutic at 0.28 on heparin 1250 units/hr. No bleeding is noted.  Goal of Therapy:  Heparin level 0.25-0.35 units/ml Monitor platelets by anticoagulation protocol: Yes   Plan:  Continue heparin 1250 units/hr Daily HL/CBC Monitor s/sx of bleeding  Andrey Cota. Diona Foley, PharmD Clinical Pharmacist Pager [phone] 03/12/2014 8:03 PM

## 2014-03-12 NOTE — Progress Notes (Signed)
Rehab admissions - I met with pt and her two daughters in follow up to rehab MD consult to explain the possibility of inpatient rehab. Further questions were answered and informational brochures were given. Pt was drowsy throughout my discussion but nodded pleasantly at times.  Pt's daughters are in support of pursuing inpatient rehab. Both dtrs do work but they are checking with other family members about different available help.  We will consider inpatient rehab admit pending her medical clearance and our bed availability. I will check on pt's status tomorrow.  Please call me with any questions. Thanks.  Nanetta Batty, PT Rehabilitation Admissions Coordinator [phone]

## 2014-03-12 NOTE — Progress Notes (Signed)
Physical Therapy Treatment Patient Details Name: Sandy Barrett MRN: [id number] DOB: [birth year] Today's Date: 03/12/2014    History of Present Illness Patient here with acute onset of expressive aphasia with right-sided weakness that began approximately 1 hour prior to arrival. She has a history of hypertension, OA, and RTHR    PT Comments    Pt con't to have difficulty communicating and following commands.Pt with R hemiplegia and noted extensor tone. Pt is motivated and cooperative. Pt con't to benefit from CIR upon D/C.  Follow Up Recommendations  CIR     Equipment Recommendations  Other (comment)    Recommendations for Other Services Rehab consult     Precautions / Restrictions Precautions Precautions: Fall Restrictions Weight Bearing Restrictions: No Other Position/Activity Restrictions: R shoulder with sublux    Mobility  Bed Mobility Overal bed mobility: Needs Assistance Bed Mobility: Sit to Supine     Supine to sit: Max assist Sit to supine: Max assist;+2 for physical assistance   General bed mobility comments: manage LEs and trunk  Transfers Overall transfer level: Needs assistance Equipment used:  (2 person lift with gait belt and pad) Transfers: Sit to/from Stand Sit to Stand: Max assist;+2 physical assistance Stand pivot transfers: +2 physical assistance;Mod assist       General transfer comment: pt pulled self forward with L UE on chair. pt able to extend and pvt on L LE but R LE went into extensor tone and pt was unable to advance requriing maxA to manage R LE. completed stand x2   Ambulation/Gait                 Stairs            Wheelchair Mobility    Modified Rankin (Stroke Patients Only) Modified Rankin (Stroke Patients Only) Pre-Morbid Rankin Score: No symptoms Modified Rankin: Severe disability     Balance Overall balance assessment: Needs assistance Sitting-balance support: Feet supported Sitting balance-Leahy Scale:  Poor                              Cognition Arousal/Alertness: Awake/alert Behavior During Therapy: Flat affect Overall Cognitive Status: Impaired/Different from baseline Area of Impairment: Attention;Following commands;Problem solving   Current Attention Level: Sustained Memory: Decreased short-term memory Following Commands: Follows one step commands inconsistently     Problem Solving: Slow processing;Decreased initiation;Difficulty sequencing;Requires verbal cues;Requires tactile cues General Comments: pt with both expressive and receptive aphasia    Exercises      General Comments General comments (skin integrity, edema, etc.): attempted LE ther ex however pt unable to follow comands      Pertinent Vitals/Pain Pain Assessment: Faces Faces Pain Scale: Hurts little more Pain Location: R LE with mvmt, noted tone Pain Descriptors / Indicators: Grimacing    Home Living                      Prior Function            PT Goals (current goals can now be found in the care plan section) Progress towards PT goals: Progressing toward goals    Frequency  Min 4X/week    PT Plan Current plan remains appropriate    Co-evaluation             End of Session Equipment Utilized During Treatment: Gait belt Activity Tolerance: Patient limited by fatigue Patient left: with call bell/phone within reach;with family/visitor present;in bed  Time: 4782-9562 PT Time Calculation (min) (ACUTE ONLY): 28 min  Charges:  $Therapeutic Activity: 23-37 mins                    G Codes:      Kingsley Callander 03/12/2014, 4:28 PM   Kittie Plater, PT, DPT Pager #: [phone] Office #: [phone]

## 2014-03-12 NOTE — Progress Notes (Signed)
Patient arrived from 64M to room via bed along with family. Safety precautions and orders reviewed with patient/family. Will continue to monitor.  Ave Filter, RN

## 2014-03-12 NOTE — Progress Notes (Signed)
ANTICOAGULATION CONSULT NOTE - Follow Up Consult  Pharmacy Consult for Heparin  Indication: pulmonary embolus and DVT  No Known Allergies  Patient Measurements: Height: 5\' 5"  (165.1 cm) Weight: 175 lb 7.8 oz (79.6 kg) IBW/kg (Calculated) : 57  Vital Signs: Temp: 98.5 F (36.9 C) (03/07 0358) Temp Source: Axillary (03/07 0358) BP: 105/58 mmHg (03/07 0500) Pulse Rate: 90 (03/07 0500)  Labs:  Recent Labs  03/10/14 0219  03/11/14 0301 03/11/14 1323 03/12/14 0415  HGB  --   --  9.1*  --  8.5*  HCT  --   --  28.9*  --  27.4*  PLT  --   --  419*  --  458*  HEPARINUNFRC  --   < > 0.26* 0.33 0.14*  CREATININE 0.57  --  0.43*  --   --   < > = values in this interval not displayed.  Estimated Creatinine Clearance: 59.4 mL/min (by C-G formula based on Cr of 0.43).   Assessment: 81 yo female with CVA and also noted with PE and DVT for heparin  Goal of Therapy:  Heparin level 0.25-0.35 units/ml Monitor platelets by anticoagulation protocol: Yes   Plan:  Increase Heparin 1250 units/hr Check heparin level in 8 hours.   Phillis Knack, PharmD, BCPS   03/12/2014 5:45 AM

## 2014-03-12 NOTE — Progress Notes (Signed)
Speech Language Pathology Treatment: Dysphagia;Cognitive-Linquistic  Patient Details Name: Serine Kea MRN: [id number] DOB: 22-Jan-1932 Today's Date: 03/12/2014 Time: 3664-4034 SLP Time Calculation (min) (ACUTE ONLY): 20 min  Assessment / Plan / Recommendation Clinical Impression  Pt's automatic responses and oral abilities have shown mild improvement since yesterday. PO trials with teaspoon amounts of honey thick liquid did not elicit s/s of aspiration. Pt demonstrated delayed cough x2 following cups sips of honey thick liquids. Pt continues to demonstrate severe impairment in receptive and expressive language skills. Pt able to identify familiar functional objects when given total assist. Pt demonstrated ideational apraxia, using familiar objects inappropriately.  Pt attempted to mouth words during counting task with SLP, although inconsistent throughout session. Pt became fatigued at the end of the session. Discussed with pt's daughter diet recommendations and strategies to use when engaging in verbal communication with pt; provide pt with names of functional objects and what they are used for; let pt know when she is not being understood; use more automatic speech tasks such as "How are you doing?" Recommend pt continue dys 1/ honey thick liquid diet.   HPI HPI: Extubated 3/4. 89 year of female admitted with right sided weakness, facial droop, and difficulty speaking. CT showed a dense left MCA CVA. CT chest showed a large thyroid mass as well as bilateral pulmonary emboli. TPA administered. S/p bilateral common carotid and left vertebral arteriograms, followed by unsuccessful attempt at revascularization. F/u MRI with patchy areas of acute left MCA territory infarction, several punctate foci of acute right MCA territory infarction, possible trace subarachnoid hemorrhage in the high right frontal lobe.PMH of HTN, migraine headache, postraumatic stress disorder.    Pertinent Vitals    SLP Plan  Continue with current plan of care    Recommendations Diet recommendations: Dysphagia 1 (puree);Honey-thick liquid Liquids provided via: Teaspoon Medication Administration: Crushed with puree Supervision: Full supervision/cueing for compensatory strategies;Trained caregiver to feed patient;Staff to assist with self feeding Compensations: Slow rate;Small sips/bites Postural Changes and/or Swallow Maneuvers: Seated upright 90 degrees              General recommendations: Rehab consult Oral Care Recommendations: Oral care BID Follow up Recommendations: Inpatient Rehab Plan: Continue with current plan of care    GO     Bermudez-Bosch, Zachrey Deutscher 03/12/2014, 12:51 PM

## 2014-03-12 NOTE — Progress Notes (Signed)
STROKE TEAM PROGRESS NOTE   HISTORY Sandy Barrett is a 81 y.o. female with history of hypertension, hyperlipidemia and hyperthyroidism, brought to the emergency room at [facility] with new onset speech difficulty. Patient was noted to have right-sided weakness and facial droop on arrival in the emergency room. She was last known well at noon when she talked on the phone with her sister and is doing well. She went to a hair appointment today at 1300 hours and the hair dresser immediately noted she was confused and not talking correctly. Her daughter noted difficulty with speech output as well as dysarthria. She was brought to [facility] ED and upon entering ED code stroke was called. Patient was transferred to [facility] ED. CT showed a dense left MCA sign. Currently she is aphasic, right hemiparesis, right hemianopsia. Decision to go to CT for CT perfusion was made. Perfusion study showed a large area of penumbra involving the left MCA territory. tPA was delayed secondary to difficulty determining last known well after multiple phone calls with family. It was determined reliably that patient was last known well at noon. Patient subsequently required CT chest to rule out possible AO aortic dissection due to widened mediastinum seen on chest x-ray. Chest CT not show an aortic aneurysm, nor dissection of aorta. Study, however, did show a large thyroid mass as well as bilateral pulmonary emboli. After determine no aortic dissection, tPA was administered. There was minimal improvement, and patient was subsequently taken to Interventional Radiology for further management   S/P bilateral common carotid and Lt vertebral arteriograms,followed by unsuccessful attempt at revascularization due technical difficulties - Dr Estanislado Pandy.  Date last known well: Date: 03/07/2014 Time last known well: Time: 12:00 noon tPA Given: No: out of window Modified Rankin: Rankin Score=4   SUBJECTIVE (INTERVAL HISTORY) Her  daughters are at the bedside. Overall her condition is unchanged. She is showing gradual improvement and is attempting to speak . She is on dysphagia 1 diet. OBJECTIVE Temp:  [98.3 F (36.8 C)-99.5 F (37.5 C)] 98.7 F (37.1 C) (03/07 0757) Pulse Rate:  [71-108] 91 (03/07 0700) Cardiac Rhythm:  [-] Sinus tachycardia (03/06 1906) Resp:  [16-22] 21 (03/07 0700) BP: (102-148)/(49-105) 102/52 mmHg (03/07 0700) SpO2:  [90 %-100 %] 96 % (03/07 0700) Weight:  [175 lb 7.8 oz (79.6 kg)] 175 lb 7.8 oz (79.6 kg) (03/07 0400)   Recent Labs Lab 03/11/14 0003 03/11/14 0327 03/11/14 0806 03/11/14 1202 03/11/14 1554  GLUCAP 96 98 93 114* 109*    Recent Labs Lab 03/08/14 1448  03/08/14 1553 03/08/14 2218 03/09/14 0450 03/10/14 0219 03/11/14 0301  NA 143  < > 142 141 143  142 144 143  K 5.1  < > 4.7 3.0* 3.1*  3.0* 2.8* 3.1*  CL 105  < > 101 106 108  109 110 113*  CO2 31  --   --  29 25  26 26 22   GLUCOSE 123*  < > 120* 143* 134*  137* 118* 108*  BUN 13  < > 16 8 8  7 7 8   CREATININE 0.76  < > 0.70 0.55 0.55  0.53 0.57 0.43*  CALCIUM 9.0  --   --  8.4 8.3*  8.2* 8.1* 8.5  MG  --   --   --   --  1.8 1.9  --   PHOS  --   --   --   --  4.2  4.2  --   --   < > =  values in this interval not displayed.  Recent Labs Lab 03/08/14 1448 03/09/14 0450  AST 50*  --   ALT 18  --   ALKPHOS 63  --   BILITOT 1.8*  --   PROT 6.4  --   ALBUMIN 3.3* 2.5*    Recent Labs Lab 03/08/14 1448  03/08/14 1553 03/08/14 2218 03/09/14 0450 03/11/14 0301 03/12/14 0415  WBC 8.7  --   --  8.5 11.3* 12.5* 10.5  NEUTROABS 6.6  --   --   --   --   --   --   HGB 12.9  < > 15.0 11.3* 10.4* 9.1* 8.5*  HCT 40.4  < > 44.0 34.8* 32.6* 28.9* 27.4*  MCV 94.6  --   --  91.1 91.6 94.1 94.2  PLT 409*  --   --  300 287 419* 458*  < > = values in this interval not displayed. No results for input(s): CKTOTAL, CKMB, CKMBINDEX, TROPONINI in the last 168 hours. No results for input(s): LABPROT, INR in the  last 72 hours. No results for input(s): COLORURINE, LABSPEC, [place], GLUCOSEU, HGBUR, BILIRUBINUR, KETONESUR, PROTEINUR, UROBILINOGEN, NITRITE, LEUKOCYTESUR in the last 72 hours.  Invalid input(s): APPERANCEUR     Component Value Date/Time   CHOL 95 03/09/2014 0450   TRIG 54 03/09/2014 0450   HDL 31* 03/09/2014 0450   CHOLHDL 3.1 03/09/2014 0450   VLDL 11 03/09/2014 0450   LDLCALC 53 03/09/2014 0450   Lab Results  Component Value Date   HGBA1C 5.4 03/09/2014      Component Value Date/Time   LABOPIA NONE DETECTED 03/08/2014 1601   COCAINSCRNUR NONE DETECTED 03/08/2014 1601   LABBENZ NONE DETECTED 03/08/2014 1601   AMPHETMU NONE DETECTED 03/08/2014 1601   THCU NONE DETECTED 03/08/2014 1601   LABBARB NONE DETECTED 03/08/2014 1601     Recent Labs Lab 03/08/14 2218  ETH <5    Ct Head Wo Contrast 03/08/2014    Increased attenuation in the left middle cerebral artery. Early thrombosis in this vessel must be of concern given this appearance. Elsewhere gray-white compartments appear normal. No hemorrhage or mass effect.    Ct Cerebral Perfusion W/cm 03/08/2014    Occlusion left internal carotid artery and left M1 segment.  CT perfusion reveals increased mean transit time in the left MCA territory without significant decrease in cerebral blood volume. Findings suggest a large area of severe ischemia and penumbra without definite core infarct at this time.    Dg Chest Port 1 View 03/08/2014    1. Endotracheal and orogastric tubes are in good position.  2. Tracheal and esophageal displacement from goiter.  3. Stable aeration.     Dg Chest Portable 1 View 03/08/2014    Stable intrathoracic goiter on the left with tracheal deviation.  Increased vascular congestion.     Ct Angio Chest Aorta W/cm &/or Wo/cm 03/08/2014    Positive for acute PE with CT evidence of right heart strain (RV/LV Ratio = 0.9) consistent with at least submassive (intermediate risk)PE. The presence of right heart  strain has been associated with an increased risk of morbidity and mortality. Consultation with Pulmonary and Critical Care Medicine is recommended. The patient is concurrently receiving systemic tPA for a cerebral arterial thrombus as per the emergency room physician.   Large left intrathoracic goiter with displacement of the trachea, aortic branches and superior vena cava. Some chest wall collaterals are noted secondary to the effect on the superior vena cava.  Changes consistent  with left internal jugular vein thrombus likely related extrinsic compression on the vein by the goiter. There is some suggestion of right jugular venous thrombosis as well.    MRI 1. Patchy areas of acute left MCA territory infarction. 2. Several punctate foci of acute right MCA territory infarction. Possible trace subarachnoid hemorrhage in the high right frontal lobe. 3. Occluded left ICA and left MCA.  2D echo -Left ventricle: The cavity size was normal. There was mild concentric hypertrophy. Systolic function was vigorous. The estimated ejection fraction was in the range of 65% to 70%.interatrial septal aneurysm noted, bubble study not done  Venous doppler - DVT noted in the right distal CFV, FV, and pop v. DVT noted in the left FV, pop V. Right jugular vein flow is sluggish, but no thrombus noted. Left jugular vein thrombus appears subacute and is non occlusive.  CUS - Bilateral: 1-39% ICA stenosis. Vertebral artery flow is antegrade. Left side waveforms are pulsatile throughout and thumpy in the ICA suggesting distal occlusion.  PHYSICAL EXAM  Temp:  [98.3 F (36.8 C)-99.5 F (37.5 C)] 98.7 F (37.1 C) (03/07 0757) Pulse Rate:  [71-108] 91 (03/07 0700) Resp:  [16-22] 21 (03/07 0700) BP: (102-148)/(49-105) 102/52 mmHg (03/07 0700) SpO2:  [90 %-100 %] 96 % (03/07 0700) Weight:  [175 lb 7.8 oz (79.6 kg)] 175 lb 7.8 oz (79.6 kg) (03/07 0400)  General - Well nourished, well developed, intubated on  low dose fentanyl and not following commands.  Ophthalmologic - fundi not visulized.  Cardiovascular - Regular rate and rhythm.  Neuro - Awake alert globally aphasic but can follow simple midline and few 1 step commands.   PERRL, left preference, but able to cross midline, blinking to visual threat on the left but not on the right, CN VII and XII difficult to assess due to ET tube. Right hemiplegic with UE 0/5 and LE 1/5 on pain stimulation, LUE and LLE spontaneous movement. Reflex 1+, babinski negative bilaterally. Coordination and gait not tested.  ASSESSMENT/PLAN [name] is a 81 y.o. female with history of hypertension, hyperlipidemia and hyperthyroidism, hyperthyroidism, migraine headaches, presenting with speech difficulties, right hemiparesis, confusion, right hemianopsia, and right facial droop. She did receive IV t-PA 67 mg on 03/08/2014 at 1615 for the above-noted deficits. She was subsequently taken to the interventional radiology suite; however, revascularization attempts were unsuccessful.  Stroke:  Dominant left MCA territory infarcts secondary to occlusion of the left internal carotid artery. Highly suspicious for paradoxical emboli.   Resultant  Aphasia and right hemiplegia MRI  1. Patchy areas of acute left MCA territory infarction. 2. Several punctate foci of acute right MCA territory infarction. Possible trace subarachnoid hemorrhage in the high right frontal lobe.  MRA  Occluded left ICA and left MCA  Carotid Doppler  Left ICA distal occlusion  Lower extremity venous Dopplers - extensive DVT b/l LEs  2D Echo  Normal EF. Interatrial septal aneurysm  LDL 53  HgbA1c 5.4  Hypercoagulable work up pending  SCDs for VTE prophylaxis  DIET - DYS 1 no liquids  no antithrombotic prior to admission, now on no antithrombotic status post TPA administration. Will consider low intensity heparin drip if MRI did not show large MCA infarcts and 24h off  tPA.  Ongoing aggressive stroke risk factor management  Therapy recommendations: CLR  Disposition: likely CLR  Massive PE and bilateral extensive LE DVTs  Found on CT chest and venous doppler  Etiology unclear and hypercoagulable work up pending  Huge goiter  could contribute to jugular vein thrombosis but not able to explain LE DVTs  Will consider  low intensity heparin drip if MRI did not show large MCA infarcts and 24h off tPA  Large goiter   Found on CT chest and CXR  Chronic process  Contribute to jugular vein subacute thrombosis  Consider surgery consult once pt stabilized.  Respiratory failure  Intubated  CCM on board  Extubate when able  Hypertension  Home meds: Hydrochlorothiazide 25 mg daily and Inderal 20 mg twice daily  Blood pressure running somewhat low  Permissive hypertension <220/120 for 24-48 hours and then gradually normalize within 5-7 days  Hold off BP meds for now  Other Stroke Risk Factors  Advanced age  Migraines  Other Active Problems  Acute PE with CT evidence of right heart strain   Large left intrathoracic goiter - question malignant.  Left internal jugular vein thrombus likely related extrinsic compression on the vein by the goiter.   Hypokalemia  Anemia   Other Pertinent History    Hospital day # 4    I spoke to the patient's daughters regarding her unfortunate situation and significant risk for bleeding on heparin, neurological worsening and dying from pulmonary embolism. Family understands the risk and are in agreement with the current plan.Will wait 1 week from her stroke prior to switching to oral anticoagulation Plan to transfer to floor today and mobilize out of bed. Therapy and rehab consults. Antony Contras, MD Stroke Neurology 03/12/2014 10:09 AM  To contact Stroke Continuity provider, please refer to [email or web address] After hours, contact General Neurology

## 2014-03-12 NOTE — Progress Notes (Signed)
PULMONARY / CRITICAL CARE MEDICINE   Name: Sandy Barrett MRN: [id number] DOB: December 28, 1932    ADMISSION DATE:  03/08/2014 CONSULTATION DATE:  03/08/2014  REFERRING MD :  Rancour  CHIEF COMPLAINT:  Stoke, PE  INITIAL PRESENTATION:  81 yo female with Lt MCA CVA also found to have b/l PE.   STUDIES:  3/3 CT angio chest >> bilateral PE with RV/LV ratio 0.9, large left intrathoracic goiter with displacement of trachea, left IJ thrombus 3/3 Cerebral perfusion study >> occlusion of left internal carotid artery and M1 segment, suggest large area L MCA distribution stroke 3/3 CT head >> increased attenuation L MCA 3/4 LE doppler> DVT R CFV, FV, pop V  SIGNIFICANT EVENTS: 3/03 admit, tPA at 430 pm, attempted neuro-IR intervention 3/5 extubated, passed SLP eval  SUBJECTIVE: passed SLP eval 3/5  VITAL SIGNS: Temp:  [98.3 F (36.8 C)-99.5 F (37.5 C)] 98.7 F (37.1 C) (03/07 0757) Pulse Rate:  [71-108] 100 (03/07 1000) Resp:  [16-22] 19 (03/07 1000) BP: (96-148)/(39-105) 96/42 mmHg (03/07 1000) SpO2:  [90 %-100 %] 96 % (03/07 1000) Weight:  [79.6 kg (175 lb 7.8 oz)] 79.6 kg (175 lb 7.8 oz) (03/07 0400) VENTILATOR SETTINGS:   INTAKE / OUTPUT:  Intake/Output Summary (Last 24 hours) at 03/12/14 1045 Last data filed at 03/12/14 1000  Gross per 24 hour  Intake   2150 ml  Output    100 ml  Net   2050 ml    PHYSICAL EXAMINATION: General: awake, no distress, eating HEENT: NCAT, right facial droop PULM: CTA B CV: RRR, no mgr AB: BS+, soft,  Ext: warm, chronic edema Neuro: aphasic, R hemiparesis  LABS:  CBC  Recent Labs Lab 03/09/14 0450 03/11/14 0301 03/12/14 0415  WBC 11.3* 12.5* 10.5  HGB 10.4* 9.1* 8.5*  HCT 32.6* 28.9* 27.4*  PLT 287 419* 458*     Coag's  Recent Labs Lab 03/08/14 1448  APTT 32  INR 1.18   BMET  Recent Labs Lab 03/09/14 0450 03/10/14 0219 03/11/14 0301  NA 143  142 144 143  K 3.1*  3.0* 2.8* 3.1*  CL 108  109 110 113*  CO2 25   26 26 22   BUN 8  7 7 8   CREATININE 0.55  0.53 0.57 0.43*  GLUCOSE 134*  137* 118* 108*     Electrolytes  Recent Labs Lab 03/09/14 0450 03/10/14 0219 03/11/14 0301  CALCIUM 8.3*  8.2* 8.1* 8.5  MG 1.8 1.9  --   PHOS 4.2  4.2  --   --    ABG  Recent Labs Lab 03/08/14 2153  PHART 7.411  PCO2ART 39.6  PO2ART 160.0*   Liver Enzymes  Recent Labs Lab 03/08/14 1448 03/09/14 0450  AST 50*  --   ALT 18  --   ALKPHOS 63  --   BILITOT 1.8*  --   ALBUMIN 3.3* 2.5*   Glucose  Recent Labs Lab 03/11/14 0806 03/11/14 1202 03/11/14 1554 03/11/14 2001 03/11/14 2327 03/12/14 0329  GLUCAP 93 114* 109* 124* 105* 101*    Imaging Ct Head Wo Contrast  03/11/2014   CLINICAL DATA:  Follow-up examination for acute stroke, status post unsuccessful attempt at revascularization.  EXAM: CT HEAD WITHOUT CONTRAST  TECHNIQUE: Contiguous axial images were obtained from the base of the skull through the vertex without intravenous contrast.  COMPARISON:  Prior MRI from 03/09/2014.  FINDINGS: Diffuse hypodensity with blurring of the gray-white matter differentiation seen throughout the left MCA territory, compatible with evolving  acute ischemic left MCA territory infarct. There is involvement of the left basal ganglia. Overall, distribution is similar relative to prior MRI, although there is increased cytotoxic edema. There is partial effacement of the left lateral ventricle due to edema without midline shift. No hydrocephalus. There is partial effacement of the left perimesencephalic cistern. Basilar cisterns otherwise patent. No hemorrhagic transformation within the left MCA territory infarct. Left M1 segment remains dense.  Previously identified small are punctate hit infarcts within the right cerebral hemisphere not well visualized. There is a probable small focus of subarachnoid hemorrhage along the central sulcus of the right cerebral hemisphere (series 201, image 25, stable from prior MRI.  No other intracranial hemorrhage.  No new infarct.  No extra-axial fluid collection.  Calvarium intact.  No acute abnormality about the orbits.  Polypoid opacity present within the inferior left maxillary sinus. Mucoperiosteal thickening present within the ethmoidal air cells. Mastoid air cells clear.  IMPRESSION: 1. Interval evolution of acute left MCA territory infarct with increased edema as compared to prior MRI from 03/09/2014. There is mild mass effect on the left lateral ventricle without midline shift. No hemorrhagic transformation. 2. Small focus of probable subarachnoid hemorrhage along the central sulcus of the right cerebral hemisphere, stable from previous MRI. No new hemorrhage. 3. No new infarct or other acute abnormality.   Electronically Signed   By: Jeannine Boga M.D.   On: 03/11/2014 06:21   ASSESSMENT / PLAN:  NEUROLOGIC A:   Acute L MCA CVA s/p tPA 3/03 P:   Per neurology, neuro-IR PT/OT  HEMATOLOGIC A:   Concern for hypercoagulable state in setting of CVA and PE P:  Heparin gtt for 1 week then change to PO anti-coagulation. Hypercoagulability panel never sent on 3/4, so send prothrombin gene mutation, factor 5 L and anticardiolipin ab today Ensure age appropriate cancer screening  Consider consulting Dr. Beryle Beams 3/7 to evaluate for clotting disorder  ENDOCRINE A:   Massive goiter >> present since at least 2012 and unchanged but if no clear cause of hypercoagulable state, does this need a re-biopsy? P:   TSH 0.133  PULMONARY A:  Acute respiratory failure with hypoxemia due to PE and sedated on vent for IR procedure> resolved P:   Incentive spirometry. Dysphagia 1 precautions.  CARDIOVASCULAR A:   Acute PE Hx of HTN P:  Continue inderal, HCTZ.  RENAL A:  Hypokalemia P:   F/u and replace electrolytes as needed.  GASTROINTESTINAL A:   Nutrition P:   Dysphagia 1 spirometry  INFECTIOUS A:   No acute issues P:   Monitor clinically    Transfer to floor.  Continue heparin x1 wk then switch to PO coumadin.  PCCM will sign off, please call back if needed.  Rush Farmer, M.D. [facility] Pulmonary/Critical Care Medicine. Pager: [phone]. After hours pager: [phone].

## 2014-03-12 NOTE — Progress Notes (Addendum)
Occupational Therapy Treatment Patient Details Name: Sandy Barrett MRN: [id number] DOB: 1932-12-11 Today's Date: 03/12/2014    History of present illness Patient here with acute onset of expressive aphasia with right-sided weakness that began approximately 1 hour prior to arrival. She has a history of hypertension, OA, and RTHR   OT comments  This 81 yo female making some small gains since eval of two days ago. She will greatly benefit from continued OT on CIR once medically stable to get to the highest level she can to go home with probable need for 24 hour care.  Follow Up Recommendations  CIR    Equipment Recommendations   (TBD at next venue)       Precautions / Restrictions Precautions Precautions: Fall Restrictions Other Position/Activity Restrictions: R shoulder with  1 1/2 finger sublux       Mobility Bed Mobility Overal bed mobility: Needs Assistance Bed Mobility: Supine to Sit     Supine to sit: Max assist     General bed mobility comments: to move legs to EOB and bring trunk up  Transfers Overall transfer level: Needs assistance   Transfers: Sit to/from Stand;Stand Pivot Transfers Sit to Stand: Mod assist;+2 physical assistance Stand pivot transfers: +2 physical assistance;Mod assist                ADL Overall ADL's : Needs assistance/impaired     Grooming: Brushing hair Grooming Details (indicate cue type and reason): maximal A hand over hand supine in bed. Pt eventually just looked at me and shook her head no (which is the first Ive seen--usually shakes yes)                 Toilet Transfer: Moderate assistance;+2 for physical assistance;Stand-pivot (Bed>recliner going to pt's left)             General ADL Comments: Reinforced positioning of RUE with daughters in room to help support arm due to sublux and for edema control. Also educated them on retrograde massage.      Vision                 Additional Comments: Pt  continues with tendency to keep eyes and head to right, can get her to turn head to right past midline, but not eyes at present with increased verbal cues          Cognition   Behavior During Therapy: Impulsive Overall Cognitive Status: Impaired/Different from baseline Area of Impairment: Following commands;Attention   Current Attention Level: Sustained    Following Commands: Follows one step commands inconsistently;Follows one step commands with increased time (and cues)       General Comments: difficult to assess due to expressive and receptive difficulties. Pt followed one step commands 50% of time with multiple multimodal cues                  Pertinent Vitals/ Pain       Pain Assessment: Faces Faces Pain Scale: Hurts little more Pain Location: RLE with movement--feels like a hard end feel at times Pain Descriptors / Indicators: Grimacing;Guarding         Frequency Min 3X/week     Progress Toward Goals  OT Goals(current goals can now be found in the care plan section)  Progress towards OT goals: Progressing toward goals     Plan Discharge plan remains appropriate          Activity Tolerance Patient tolerated treatment well   Patient Left in chair;with  call bell/phone within reach;with family/visitor present   Nurse Communication  (nurse in room as we were getting pt up)        Time: 6546-5035 OT Time Calculation (min): 31 min  Charges: OT General Charges $OT Visit: 1 Procedure OT Treatments $Self Care/Home Management : 8-22 mins $Therapeutic Activity: 8-22 mins  Almon Register [phone] 03/12/2014, 2:24 PM

## 2014-03-13 ENCOUNTER — Inpatient Hospital Stay (HOSPITAL_COMMUNITY)
Admission: AD | Admit: 2014-03-13 | Discharge: 2014-04-05 | DRG: 057 | Disposition: A | Discharge status: Skilled Nursing Facility | Payer: Medicare Other | Source: Intra-hospital | Attending: Physical Medicine & Rehabilitation | Admitting: Physical Medicine & Rehabilitation

## 2014-03-13 ENCOUNTER — Ambulatory Visit: Payer: Medicare Other | Admitting: Family Medicine

## 2014-03-13 DIAGNOSIS — B9620 Unspecified Escherichia coli [E. coli] as the cause of diseases classified elsewhere: Secondary | ICD-10-CM | POA: Diagnosis not present

## 2014-03-13 DIAGNOSIS — H539 Unspecified visual disturbance: Secondary | ICD-10-CM | POA: Diagnosis present

## 2014-03-13 DIAGNOSIS — M6281 Muscle weakness (generalized): Secondary | ICD-10-CM | POA: Diagnosis not present

## 2014-03-13 DIAGNOSIS — IMO0002 Reserved for concepts with insufficient information to code with codable children: Secondary | ICD-10-CM

## 2014-03-13 DIAGNOSIS — Z96641 Presence of right artificial hip joint: Secondary | ICD-10-CM | POA: Diagnosis present

## 2014-03-13 DIAGNOSIS — I69392 Facial weakness following cerebral infarction: Secondary | ICD-10-CM | POA: Diagnosis not present

## 2014-03-13 DIAGNOSIS — I69351 Hemiplegia and hemiparesis following cerebral infarction affecting right dominant side: Principal | ICD-10-CM

## 2014-03-13 DIAGNOSIS — M170 Bilateral primary osteoarthritis of knee: Secondary | ICD-10-CM | POA: Diagnosis present

## 2014-03-13 DIAGNOSIS — I82403 Acute embolism and thrombosis of unspecified deep veins of lower extremity, bilateral: Secondary | ICD-10-CM | POA: Diagnosis present

## 2014-03-13 DIAGNOSIS — I82890 Acute embolism and thrombosis of other specified veins: Secondary | ICD-10-CM | POA: Diagnosis not present

## 2014-03-13 DIAGNOSIS — R482 Apraxia: Secondary | ICD-10-CM | POA: Diagnosis not present

## 2014-03-13 DIAGNOSIS — I2699 Other pulmonary embolism without acute cor pulmonale: Secondary | ICD-10-CM | POA: Diagnosis not present

## 2014-03-13 DIAGNOSIS — I63512 Cerebral infarction due to unspecified occlusion or stenosis of left middle cerebral artery: Secondary | ICD-10-CM | POA: Diagnosis not present

## 2014-03-13 DIAGNOSIS — G4700 Insomnia, unspecified: Secondary | ICD-10-CM | POA: Diagnosis present

## 2014-03-13 DIAGNOSIS — I69398 Other sequelae of cerebral infarction: Secondary | ICD-10-CM

## 2014-03-13 DIAGNOSIS — I609 Nontraumatic subarachnoid hemorrhage, unspecified: Secondary | ICD-10-CM | POA: Diagnosis not present

## 2014-03-13 DIAGNOSIS — I69921 Dysphasia following unspecified cerebrovascular disease: Secondary | ICD-10-CM | POA: Diagnosis not present

## 2014-03-13 DIAGNOSIS — D696 Thrombocytopenia, unspecified: Secondary | ICD-10-CM | POA: Diagnosis present

## 2014-03-13 DIAGNOSIS — I253 Aneurysm of heart: Secondary | ICD-10-CM | POA: Diagnosis present

## 2014-03-13 DIAGNOSIS — R1311 Dysphagia, oral phase: Secondary | ICD-10-CM | POA: Diagnosis present

## 2014-03-13 DIAGNOSIS — G8191 Hemiplegia, unspecified affecting right dominant side: Secondary | ICD-10-CM

## 2014-03-13 DIAGNOSIS — I6340 Cerebral infarction due to embolism of unspecified cerebral artery: Secondary | ICD-10-CM

## 2014-03-13 DIAGNOSIS — I639 Cerebral infarction, unspecified: Secondary | ICD-10-CM | POA: Diagnosis not present

## 2014-03-13 DIAGNOSIS — I69891 Dysphagia following other cerebrovascular disease: Secondary | ICD-10-CM | POA: Diagnosis not present

## 2014-03-13 DIAGNOSIS — E785 Hyperlipidemia, unspecified: Secondary | ICD-10-CM | POA: Diagnosis present

## 2014-03-13 DIAGNOSIS — I10 Essential (primary) hypertension: Secondary | ICD-10-CM | POA: Diagnosis present

## 2014-03-13 DIAGNOSIS — I69890 Apraxia following other cerebrovascular disease: Secondary | ICD-10-CM | POA: Diagnosis not present

## 2014-03-13 DIAGNOSIS — I82433 Acute embolism and thrombosis of popliteal vein, bilateral: Secondary | ICD-10-CM | POA: Diagnosis present

## 2014-03-13 DIAGNOSIS — N390 Urinary tract infection, site not specified: Secondary | ICD-10-CM | POA: Diagnosis not present

## 2014-03-13 DIAGNOSIS — R278 Other lack of coordination: Secondary | ICD-10-CM | POA: Diagnosis not present

## 2014-03-13 DIAGNOSIS — I82413 Acute embolism and thrombosis of femoral vein, bilateral: Secondary | ICD-10-CM | POA: Diagnosis present

## 2014-03-13 DIAGNOSIS — Z602 Problems related to living alone: Secondary | ICD-10-CM | POA: Diagnosis present

## 2014-03-13 DIAGNOSIS — E079 Disorder of thyroid, unspecified: Secondary | ICD-10-CM | POA: Diagnosis not present

## 2014-03-13 DIAGNOSIS — I69391 Dysphagia following cerebral infarction: Secondary | ICD-10-CM

## 2014-03-13 DIAGNOSIS — G8190 Hemiplegia, unspecified affecting unspecified side: Secondary | ICD-10-CM | POA: Diagnosis not present

## 2014-03-13 DIAGNOSIS — I69390 Apraxia following cerebral infarction: Secondary | ICD-10-CM | POA: Diagnosis not present

## 2014-03-13 DIAGNOSIS — F4310 Post-traumatic stress disorder, unspecified: Secondary | ICD-10-CM | POA: Diagnosis present

## 2014-03-13 DIAGNOSIS — I69320 Aphasia following cerebral infarction: Secondary | ICD-10-CM | POA: Diagnosis not present

## 2014-03-13 DIAGNOSIS — I69820 Aphasia following other cerebrovascular disease: Secondary | ICD-10-CM | POA: Diagnosis not present

## 2014-03-13 DIAGNOSIS — E042 Nontoxic multinodular goiter: Secondary | ICD-10-CM | POA: Diagnosis present

## 2014-03-13 DIAGNOSIS — I63412 Cerebral infarction due to embolism of left middle cerebral artery: Secondary | ICD-10-CM | POA: Diagnosis not present

## 2014-03-13 DIAGNOSIS — I82C12 Acute embolism and thrombosis of left internal jugular vein: Secondary | ICD-10-CM | POA: Diagnosis present

## 2014-03-13 DIAGNOSIS — R4702 Dysphasia: Secondary | ICD-10-CM | POA: Diagnosis not present

## 2014-03-13 DIAGNOSIS — R4701 Aphasia: Secondary | ICD-10-CM | POA: Diagnosis present

## 2014-03-13 LAB — CBC
HCT: 28.7 % — ABNORMAL LOW (ref 36.0–46.0)
Hemoglobin: 9.1 g/dL — ABNORMAL LOW (ref 12.0–15.0)
MCH: 29.8 pg (ref 26.0–34.0)
MCHC: 31.7 g/dL (ref 30.0–36.0)
MCV: 94.1 fL (ref 78.0–100.0)
Platelets: 528 10*3/uL — ABNORMAL HIGH (ref 150–400)
RBC: 3.05 MIL/uL — ABNORMAL LOW (ref 3.87–5.11)
RDW: 17.2 % — ABNORMAL HIGH (ref 11.5–15.5)
WBC: 9.4 10*3/uL (ref 4.0–10.5)

## 2014-03-13 LAB — GLUCOSE, CAPILLARY
Glucose-Capillary: 106 mg/dL — ABNORMAL HIGH (ref 70–99)
Glucose-Capillary: 108 mg/dL — ABNORMAL HIGH (ref 70–99)
Glucose-Capillary: 117 mg/dL — ABNORMAL HIGH (ref 70–99)
Glucose-Capillary: 118 mg/dL — ABNORMAL HIGH (ref 70–99)
Glucose-Capillary: 95 mg/dL (ref 70–99)
Glucose-Capillary: 96 mg/dL (ref 70–99)

## 2014-03-13 LAB — CARDIOLIPIN ANTIBODIES, IGG, IGM, IGA
Anticardiolipin IgA: <9 APL U/mL (ref 0–11)
Anticardiolipin IgG: <9 GPL U/mL (ref 0–14)
Anticardiolipin IgM: <9 MPL U/mL (ref 0–12)

## 2014-03-13 LAB — PROTHROMBIN GENE MUTATION

## 2014-03-13 LAB — FACTOR 5 LEIDEN

## 2014-03-13 LAB — HEPARIN LEVEL (UNFRACTIONATED): Heparin Unfractionated: 0.22 IU/mL — ABNORMAL LOW (ref 0.30–0.70)

## 2014-03-13 MED ORDER — SODIUM CHLORIDE 0.9 % IV SOLN
INTRAVENOUS | Status: DC
Start: 2014-03-13 — End: 2014-03-18
  Administered 2014-03-14 – 2014-03-17 (×4): via INTRAVENOUS

## 2014-03-13 MED ORDER — PANTOPRAZOLE SODIUM 40 MG PO PACK
40.0000 mg | PACK | ORAL | Status: DC
  Administered 2014-03-14 – 2014-03-21 (×8): 40 mg
  Filled 2014-03-13 (×9): qty 20

## 2014-03-13 MED ORDER — TRAMADOL HCL 50 MG PO TABS
50.0000 mg | ORAL_TABLET | Freq: Four times a day (QID) | ORAL | Status: DC | PRN
  Administered 2014-03-22 – 2014-04-04 (×7): 50 mg via ORAL
  Filled 2014-03-13 (×7): qty 1

## 2014-03-13 MED ORDER — PROCHLORPERAZINE EDISYLATE 5 MG/ML IJ SOLN
5.0000 mg | Freq: Four times a day (QID) | INTRAMUSCULAR | Status: DC | PRN
Start: 2014-03-13 — End: 2014-04-05

## 2014-03-13 MED ORDER — SENNOSIDES-DOCUSATE SODIUM 8.6-50 MG PO TABS: 1.0000 | ORAL_TABLET | Freq: Every evening | ORAL | Status: DC | PRN

## 2014-03-13 MED ORDER — FLEET ENEMA 7-19 GM/118ML RE ENEM: 1.0000 | ENEMA | Freq: Once | RECTAL | Status: AC | PRN

## 2014-03-13 MED ORDER — RESOURCE THICKENUP CLEAR PO POWD
ORAL | Status: DC | PRN
  Filled 2014-03-13 (×3): qty 125

## 2014-03-13 MED ORDER — PROCHLORPERAZINE 25 MG RE SUPP: 12.5000 mg | Freq: Four times a day (QID) | RECTAL | Status: DC | PRN

## 2014-03-13 MED ORDER — HEPARIN (PORCINE) IN NACL 100-0.45 UNIT/ML-% IJ SOLN
1350.0000 [IU]/h | INTRAMUSCULAR | Status: DC
  Administered 2014-03-14: 1350 [IU]/h via INTRAVENOUS
  Administered 2014-03-15: 1300 [IU]/h via INTRAVENOUS
  Administered 2014-03-15: 1350 [IU]/h via INTRAVENOUS
  Administered 2014-03-16 – 2014-03-17 (×2): 1300 [IU]/h via INTRAVENOUS
  Filled 2014-03-13 (×7): qty 250

## 2014-03-13 MED ORDER — DICLOFENAC SODIUM 1 % TD GEL
2.0000 g | Freq: Four times a day (QID) | TRANSDERMAL | Status: DC
  Administered 2014-03-13 – 2014-04-05 (×84): 2 g via TOPICAL
  Filled 2014-03-13 (×3): qty 100

## 2014-03-13 MED ORDER — BISACODYL 10 MG RE SUPP
10.0000 mg | Freq: Every day | RECTAL | Status: DC | PRN
  Filled 2014-03-13: qty 1

## 2014-03-13 MED ORDER — ALUM & MAG HYDROXIDE-SIMETH 200-200-20 MG/5ML PO SUSP: 30.0000 mL | ORAL | Status: DC | PRN

## 2014-03-13 MED ORDER — PROCHLORPERAZINE MALEATE 5 MG PO TABS
5.0000 mg | ORAL_TABLET | Freq: Four times a day (QID) | ORAL | Status: DC | PRN
  Filled 2014-03-13: qty 2

## 2014-03-13 MED ORDER — DIPHENHYDRAMINE HCL 12.5 MG/5ML PO ELIX: 12.5000 mg | ORAL_SOLUTION | Freq: Four times a day (QID) | ORAL | Status: DC | PRN

## 2014-03-13 MED ORDER — TRAZODONE HCL 50 MG PO TABS
25.0000 mg | ORAL_TABLET | Freq: Every evening | ORAL | Status: DC | PRN
  Administered 2014-03-23: 50 mg via ORAL
  Administered 2014-03-24 – 2014-03-25 (×2): 25 mg via ORAL
  Administered 2014-03-26: 50 mg via ORAL
  Filled 2014-03-13 (×4): qty 1

## 2014-03-13 MED ORDER — ACETAMINOPHEN 325 MG PO TABS
325.0000 mg | ORAL_TABLET | ORAL | Status: DC | PRN
  Administered 2014-04-03 – 2014-04-04 (×3): 650 mg via ORAL
  Filled 2014-03-13 (×2): qty 2

## 2014-03-13 MED ORDER — CETYLPYRIDINIUM CHLORIDE 0.05 % MT LIQD
7.0000 mL | Freq: Two times a day (BID) | OROMUCOSAL | Status: DC
  Administered 2014-03-13 – 2014-04-05 (×46): 7 mL via OROMUCOSAL

## 2014-03-13 MED ORDER — GUAIFENESIN-DM 100-10 MG/5ML PO SYRP: 5.0000 mL | ORAL_SOLUTION | Freq: Four times a day (QID) | ORAL | Status: DC | PRN

## 2014-03-13 NOTE — Progress Notes (Signed)
ANTICOAGULATION CONSULT NOTE - Follow Up Consult  Pharmacy Consult for Heparin  Indication: pulmonary embolus and DVT with CVA  No Known Allergies  Patient Measurements: Height: 5\' 5"  (165.1 cm) Weight: 175 lb 7.8 oz (79.6 kg) IBW/kg (Calculated) : 57  Vital Signs: Temp: 98.4 F (36.9 C) (03/08 0930) Temp Source: Oral (03/08 0930) BP: 113/54 mmHg (03/08 0930) Pulse Rate: 94 (03/08 0930)  Labs:  Recent Labs  03/11/14 0301  03/12/14 0415 03/12/14 1900 03/13/14 0703  HGB 9.1*  --  8.5*  --  9.1*  HCT 28.9*  --  27.4*  --  28.7*  PLT 419*  --  458*  --  528*  HEPARINUNFRC 0.26*  < > 0.14* 0.28* 0.22*  CREATININE 0.43*  --   --   --   --   < > = values in this interval not displayed.  Estimated Creatinine Clearance: 59.4 mL/min (by C-G formula based on Cr of 0.43).   Assessment: 81 yo female with CVA and also noted with PE and DVT continues on heparin.  Heparin is planned for 1 week (started 3/4) then switch / add po anticoagulation  Heparin level this AM = 0.22, CBC stable  Goal of Therapy:  Heparin level 0.25-0.35 units/ml Monitor platelets by anticoagulation protocol: Yes   Plan:  Increase heparin to 1350 units / hr Daily HL/CBC Monitor s/sx of bleeding  Thank you Anette Guarneri, PharmD [phone]  03/13/2014 10:14 AM

## 2014-03-13 NOTE — Progress Notes (Deleted)
03/10/14 BP pulse and sat monitored in MRI, at 12:00 120/80, 99 sat and 70 NSR. Late note.

## 2014-03-13 NOTE — Discharge Summary (Signed)
Physician Discharge Summary  Patient ID: Sandy Barrett MRN: [id number] DOB/AGE: June 12, 1932 81 y.o.  Admit date: 03/08/2014 Discharge date: 03/13/2014  Admission Diagnoses: Right sided weakness and facial droop  Discharge Diagnoses: Dominant left MCA territory infarcts secondary to occlusion of the left internal carotid artery. Highly suspicious for paradoxical emboli. Active Problems:   Essential hypertension   Abnormal chest x-ray   Stroke   Pulmonary embolism with extensive bilateral LE DVTs   CVA (cerebral infarction)   Cerebral infarction due to unspecified mechanism   Respiratory failure   Cerebral infarction due to thrombosis of right middle cerebral artery   Cerebral infarction due to embolism of left middle cerebral artery Thyroid goiter causing sluggish flow in jugular veins Respiratory failure transient Anemiia Hypokalemia   Discharged Condition: fair  Hospital Course:  [name] is a 81 y.o. female with history of hypertension, hyperlipidemia and hyperthyroidism, brought to the emergency room at [facility] with new onset speech difficulty. Patient was noted to have right-sided weakness and facial droop on arrival in the emergency room. She was last known well at noon when she talked on the phone with her sister and is doing well. She went to a hair appointment today at 1300 hours and the hair dresser immediately noted she was confused and not talking correctly. Her daughter noted difficulty with speech output as well as dysarthria. She was brought to [facility] ED and upon entering ED code stroke was called. Patient was transferred to [facility] ED. CT showed a dense left MCA sign. Currently she is aphasic, right hemiparesis, right hemianopsia. Decision to go to CT for CT perfusion was made. Perfusion study showed a large area of penumbra involving the left MCA territory. tPA was delayed secondary to difficulty determining last known well after multiple phone calls with  family. It was determined reliably that patient was last known well at noon. Patient subsequently required CT chest to rule out possible AO aortic dissection due to widened mediastinum seen on chest x-ray. Chest CT not show an aortic aneurysm, nor dissection of aorta. Study, however, did show a large thyroid mass as well as bilateral pulmonary emboli. After determine no aortic dissection, tPA was administered. There was minimal improvement, and patient was subsequently taken to Interventional Radiology for further management where she underwent  bilateral common carotid and Lt vertebral arteriograms,followed by unsuccessful attempt at revascularization due technical difficulties by Dr Estanislado Pandy. Date last known well: Date: 03/07/2014 Time last known well: Time: 12:00 noon tPA Given: No: out of window Modified Rankin: Rankin Score=4 She was admitted to the intensive care unit where she was initially intubated for the procedure subsequently she was extubated. She was found to be globally aphasic and able to follow only simple midline and occasional one-step commands and had significant right hemiparesis. She was seen by physical occupational speech therapy as well as rehabilitation and felt to be a good candidate for rehabilitation. She was cleared for dysphagia 1 diet. Transthoracic echo showed normal ejection fraction but presence of interatrial septal aneurysm. Bubble study was not done. Patient was found to have extensive bilateral DVTs as well as pulmonary emboli and hence she was started cautiously on IV heparin drip without bolus. MRI scan of the brain shows a moderate-sized left MCA infarct which was nonhemorrhagic and there are also several punctate foci of right MCA infarct and possible trace subarachnoid hemorrhage in the high right frontal convexity. MRA showed occluded terminal left ICA and MCA. Transthoracic lower extremity venous Doppler showed DVT  in the right distal common femoral vein,  popliteal vein and left femoral vein, popliteal vein. Right jugular vein flow was sluggish but no thrombus was noted. Carotid ultrasound showed no significant extracranial stenosis with abnormal waveform with left ICA suggested distal occlusion. Patient was also found to have a large intrathoracic goiter with displacement of the trachea, aortic branches and superior vena cava. The sluggish flow in the jugular vein was felt to be due to pressure from the goiter. LDL cholesterol was 53. Hemoglobin A1c was 5.4. Protein C and S activity were normal. Lupus anticoagulant was negative. EKG and telemetry monitoring did not reveal any atrial fibrillation. Patient continued to have profound aphasia and dense right hemiplegia throughout hospitalization. She was transferred in stable condition to inpatient rehabilitation to continue ongoing therapies with instructions to follow-up with Dr. Willaim Rayas in 2 months and primary physician a few weeks. Plan was to continue IV heparin drip for 1 week since her stroke and repeat CT scan of the head 03/15/14 and if no significant hemorrhage was noted switched to oral anticoagulation with Pradaxa  Consults: rehabilitation medicine,Critical care Medicine and Interventional Neuroradiology     Discharge Exam: Blood pressure 113/54, pulse 94, temperature 98.4 F (36.9 C), temperature source Oral, resp. rate 20, height 5\' 5"  (1.651 m), weight 175 lb 7.8 oz (79.6 kg), SpO2 93 %.  Neuro - Awake alert globally aphasic but can follow simple midline and few 1 step commands. PERRL, left preference, but able to cross midline, blinking to visual threat on the left but not on the right, CN VII and XII difficult to assess due to ET tube. Right hemiplegic with UE 0/5 and LE 1/5 on pain stimulation, LUE and LLE spontaneous movement. Reflex 1+, babinski negative bilaterally. Coordination and gait not tested.  Disposition: 01-Home or Self Care     Medication List    ASK your doctor about  these medications        ALIGN 4 MG Caps  Take by mouth daily.     butalbital-acetaminophen-caffeine 50-325-40 MG per tablet  Commonly known as:  FIORICET, ESGIC  TAKE 1 TABLET BY MOUTH TWICE A DAY AS NEEDED FOR HEADACHE     diclofenac sodium 1 % Gel  Commonly known as:  VOLTAREN  Apply 2 g topically 3 (three) times daily. To affected joint.     esomeprazole 40 MG capsule  Commonly known as:  NEXIUM  Take 1 capsule (40 mg total) by mouth daily.     hydrochlorothiazide 25 MG tablet  Commonly known as:  HYDRODIURIL  Take 1 tablet (25 mg total) by mouth daily.     meloxicam 7.5 MG tablet  Commonly known as:  MOBIC  Take 1 tablet (7.5 mg total) by mouth daily.     polyethylene glycol powder powder  Commonly known as:  GLYCOLAX/MIRALAX  TAKE 17 G BY MOUTH DAILY.     propranolol 20 MG tablet  Commonly known as:  INDERAL  Take 1 tablet (20 mg total) by mouth 2 (two) times daily.     traMADol 50 MG tablet  Commonly known as:  ULTRAM  Take 1 tablet (50 mg total) by mouth 3 (three) times daily as needed.     traZODone 50 MG tablet  Commonly known as:  DESYREL  Take 1 tablet (50 mg total) by mouth at bedtime as needed for sleep. IV Heparin drip till 03/15/14 and check head CT if no bleed then switch to Pradaxa -dose per pharmacy  Follow-up Information    Follow up with SETHI,PRAMOD, MD In 2 months.   Specialties:  Neurology, Radiology   Why:  stroke clinic, office will call you for follow up appointment   Contact information:   [address] [place] [address] [phone]      Total time spent in doing discharge summary 40 minutes Signed: SETHI,PRAMOD 03/13/2014, 12:31 PM

## 2014-03-13 NOTE — Care Management Note (Signed)
    Page 1 of 1   03/13/2014     11:11:09 AM CARE MANAGEMENT NOTE 03/13/2014  Patient:  Sandy Barrett, Sandy Barrett   Account Number:  [id number]  Date Initiated:  03/09/2014  Documentation initiated by:  Sandi Mariscal  Subjective/Objective Assessment:   Stroke; tPA and IR procedure; pt remained on vent after procedure     Action/Plan:   await stabilization and then therapy evals to determine pt's needs for next level of care   Anticipated DC Date:  03/14/2014   Anticipated DC Plan:  SKILLED NURSING FACILITY         Choice offered to / List presented to:             Status of service:  In process, will continue to follow Medicare Important Message given?  YES (If response is "NO", the following Medicare IM given date fields will be blank) Date Medicare IM given:  03/13/2014 Medicare IM given by:  Lorne Skeens Date Additional Medicare IM given:   Additional Medicare IM given by:    Discharge Disposition:  IP REHAB FACILITY  Per UR Regulation:  Reviewed for med. necessity/level of care/duration of stay  If discussed at [place] of Stay Meetings, dates discussed:    Comments:  03/13/14 [place], MSN, CM- Medicare IM letter provided. Patient is discharging to inpatient rehab today.

## 2014-03-13 NOTE — Progress Notes (Signed)
Rehab admissions - I am following pt's case and received medical clearance from Dr. Leonie Man for inpatient rehab. Bed is available and will admit to CIR today.  I met with pt and her dtr and updated them of the plan for CIR. I also gave pt's dtr a tour of the rehab unit. I will complete admission paperwork with dtr shortly.  I updated Loma Sousa, case Merchant navy officer, Education officer, museum. Pt's RN also aware.  Thanks.  Nanetta Batty, PT Rehabilitation Admissions Coordinator [phone]

## 2014-03-13 NOTE — PMR Pre-admission (Signed)
PMR Admission Coordinator Pre-Admission Assessment  Patient: Sandy Barrett is an 81 y.o., female MRN: [id number] DOB: June 09, 1932 Height: 5\' 5"  (165.1 cm) Weight: 79.6 kg (175 lb 7.8 oz)              Insurance Information  PRIMARY: Medicare A & B      Policy#: [id number]      Subscriber: self Pre-Cert#: verified in [employer]: retired Runner, broadcasting/film/video. Date: A & B: 08-06-99     Deduct: $1288      Out of Pocket Max: none      Life Max: unlimited CIR: 100%      SNF: 100% days 1-20; 80% days 21-100 (100 day visit max) Outpatient: 80%     Co-Pay: 20% Home Health: 100%      Co-Pay: none DME: 80%     Co-Pay: 20% Providers: pt's preference  SECONDARY: BC[employer] of [place]      Policy#: [id number]      Subscriber: self Benefits:  Phone #: [phone]       Emergency Contact Information Contact Information    Name Relation Home Work [place] Daughter [phone]  [phone]   Marsala,Camilla Daughter   [phone]     Current Medical History  Patient Admitting Diagnosis: large left MCA territory infarct with dense right hemiparesis and aphasia  History of Present Illness: Sandy Barrett is a 81 y.o. female with history of HTN, OA, hyperlipidemia who was admitted on 03/08/14 with right sided weakness, difficulty talking and right facial droop. Patient had a hair appointment that day at 1300 hours and the hair dresser immediately noted she was confused and not talking correctly. CT head showed dense left MCA sign and Perfusion study showed a large area of penumbra involving the left MCA territory.  Patient last seen normal at noon that day and tPA administered past CT chest negative for dissection. However incidental note of large thyroid mass as well as bilateral pulmonary emboli. Patient underwent cerebral angiogram with unsuccessful attempts at revascularization due to technical difficulties.  Carotid dopplers with sluggish flow right jugular vein and left jegular vein  thrombus--subacute and non-occlusive. BLE dopplers with DVT R-distal CFV, FV, popliteal vein and DVT L-FV and popliteal vein.  Patient started on IV heparin for treatment on 03/04 and tolerated extubated without difficulty. Swallow evaluation with moderate to severe oral phase deficits and NPO recommended. Patient with resultant, right sided weakness, right visual deficits with left gaze preference and severe global aphasia with apraxia. CIR recommended by MD and Rehab team.  NIH Total: 25  Past Medical History  Past Medical History  Diagnosis Date  . HYPERTENSION   . HYPERTHYROIDISM   . OBESITY   . ARTHRITIS   . Posttraumatic stress disorder   . INSOMNIA   . MIGRAINE HEADACHE   . Hyperthyroidism     s/p I-131 ablation 03/2011 of multinod goiter  . Arthritis     Family History  family history includes Asthma in her father and mother; Prostate cancer in her father.  Prior Rehab/Hospitalizations: pt had outpt PT for bilateral knee issues.   Current Medications   Current facility-administered medications:  .  0.9 %  sodium chloride infusion, , Intravenous, Continuous, David L Rinehuls, PA-C, Last Rate: 75 mL/hr at 03/13/14 0355 .  acetaminophen (TYLENOL) tablet 650 mg, 650 mg, Oral, Q4H PRN **OR** acetaminophen (TYLENOL) suppository 650 mg, 650 mg, Rectal, Q4H PRN, Marliss Coots, PA-C .  antiseptic oral rinse (CPC / CETYLPYRIDINIUM  CHLORIDE 0.05%) solution 7 mL, 7 mL, Mouth Rinse, BID, Rosalin Hawking, MD, 7 mL at 03/13/14 0800 .  heparin ADULT infusion 100 units/mL (25000 units/250 mL), 1,350 Units/hr, Intravenous, Continuous, Garvin Fila, MD, Last Rate: 13.5 mL/hr at 03/13/14 1110, 1,350 Units/hr at 03/13/14 1110 .  labetalol (NORMODYNE,TRANDATE) injection 10 mg, 10 mg, Intravenous, Q10 min PRN, Marliss Coots, PA-C .  pantoprazole sodium (PROTONIX) 40 mg/20 mL oral suspension 40 mg, 40 mg, Per Tube, Q24H, Chesley Mires, MD, 40 mg at 03/13/14 0950 .  RESOURCE THICKENUP CLEAR, , Oral, PRN,  Rosalin Hawking, MD .  senna-docusate (Senokot-S) tablet 1 tablet, 1 tablet, Oral, QHS PRN, Marliss Coots, PA-C  Patients Current Diet: DIET - DYS 1, honey thick, spoon bites/sips only (note: pt has partial dentures as well)  Precautions / Restrictions Precautions Precautions: Fall Restrictions Weight Bearing Restrictions: No Other Position/Activity Restrictions: R shoulder with sublux   Prior Activity Level Limited Community (1-2x/wk): Pt got out about every other day and goes to the beauty shop every 2 weeks. She did her own errands. She was limited by arthritis in both knees, her L knee worse than R. Per PT note, pt with bilateral knee OA, was having trouble getting up and down stairs but pt was independent, had driven herself to beauty parlor day of stroke     Home Assistive Devices / [place] Devices/Equipment: Cane (specify quad or straight) (straight) Home Equipment: Cane - single point  Prior Functional Level Prior Function Level of Independence: Independent with assistive device(s) Comments: Driving  Current Functional Level Cognition  Arousal/Alertness: Awake/alert Overall Cognitive Status: Impaired/Different from baseline Difficult to assess due to: Impaired communication Current Attention Level: Sustained Orientation Level: Oriented to person Following Commands: Follows one step commands inconsistently General Comments: pt with both expressive and receptive aphasia Attention: Focused Focused Attention: Impaired Focused Attention Impairment: Verbal basic, Functional basic Memory: Impaired Memory Impairment: Storage deficit Awareness: Impaired Awareness Impairment: Intellectual impairment Problem Solving: Impaired Problem Solving Impairment: Verbal basic, Functional basic Safety/Judgment: Impaired Comments: requiring use of mittens    Extremity Assessment (includes Sensation/Coordination)  Upper Extremity Assessment: Defer to OT evaluation RUE  Deficits / Details: brunstum 0; PROM WNL except tightness noted for forearm supination; 1 1/2 finger sublux RUE Sensation: decreased light touch RUE Coordination: decreased fine motor, decreased gross motor  Lower Extremity Assessment: RLE deficits/detail, LLE deficits/detail, Difficult to assess due to impaired cognition RLE Deficits / Details: no active motion RLE on command however, in standing, pt did actively extend right hip through partial range for transfer RLE Sensation: decreased light touch, decreased proprioception RLE Coordination: decreased fine motor, decreased gross motor LLE Deficits / Details: difficult to assess due to cognition but when pt's left knee extended, she was able to hold it in full extension, no buckling of left knee in standing and able to pivot left foot in standing    ADLs  Overall ADL's : Needs assistance/impaired Eating/Feeding: NPO Grooming: Brushing hair Grooming Details (indicate cue type and reason): maximal A hand over hand supine in bed. Pt eventually just looked at me and shook her head no (which is the first Ive seen--usually shakes yes) Upper Body Bathing: Maximal assistance, Sitting Lower Body Bathing: Maximal assistance (with +2 Mod A sit<>stand) Upper Body Dressing : Total assistance, Sitting Lower Body Dressing: Total assistance (with +2 Mod A sit<>stand) Toilet Transfer: Moderate assistance, +2 for physical assistance, Stand-pivot (Bed>recliner going to pt's left) Toileting- Clothing Manipulation and Hygiene: Total assistance (with +  2 Mod A sit<>stand) General ADL Comments: Reinforced positioning of RUE with daughters in room to help support arm due to sublux and for edema control. Also educated them on retrograde massage.    Mobility  Overal bed mobility: Needs Assistance Bed Mobility: Sit to Supine Supine to sit: Max assist Sit to supine: Max assist, +2 for physical assistance General bed mobility comments: manage LEs and trunk     Transfers  Overall transfer level: Needs assistance Equipment used:  (2 person lift with gait belt and pad) Transfers: Sit to/from Stand Sit to Stand: Max assist, +2 physical assistance Stand pivot transfers: +2 physical assistance, Mod assist General transfer comment: pt pulled self forward with L UE on chair. pt able to extend and pvt on L LE but R LE went into extensor tone and pt was unable to advance requriing maxA to manage R LE. completed stand x2     Ambulation / Gait / Stairs / Wheelchair Mobility  Ambulation/Gait General Gait Details: unable    Posture / Balance Dynamic Sitting Balance Sitting balance - Comments: Did lose balance posteriorly x2  Balance Overall balance assessment: Needs assistance Sitting-balance support: Feet supported Sitting balance-Leahy Scale: Poor Sitting balance - Comments: Did lose balance posteriorly x2  Standing balance support: Bilateral upper extremity supported, During functional activity Standing balance-Leahy Scale: Zero Standing balance comment: unable to support right side body on her own    Special needs/care consideration BiPAP/CPAP no  CPM no  Continuous Drip IV no  Dialysis no          Life Vest no Oxygen no  Special Bed no  Trach Size no  Wound Vac (area) no        Skin - dtr reports several bruises on large bruise on L UE                               Bowel mgmt: last BM on 3-7, + incontinence Bladder mgmt: urinary incontinence Diabetic mgmt no  Pt's dtrs are requesting family education on how to assist pt with her partial dentures.   Previous Home Environment Living Arrangements: Alone Type of Home: House Home Layout: One level Home Access: Stairs to enter Entrance Stairs-Rails: Can reach both Entrance Stairs-Number of Steps: 3 Home Care Services: No Additional Comments: pt with bilateral knee OA, was having trouble getting up and down stairs but pt was independent, had driven herself to beauty parlor day of  stroke  Discharge Living Setting Plans for Discharge Living Setting:  (unsure of at this time, likely SNF) Does the patient have any problems obtaining your medications?: No (family can provide needed assist with this.)  Social/Family/Support Systems Patient Roles: Other (Comment) (involved in her family and friends) Contact Information: dts are primary contact Anticipated Caregiver: Two dtrs work, other family members are supportive and they are not sure of DC plan. Considering SNF. Anticipated Caregiver's Contact Information: see above Ability/Limitations of Caregiver: Both dtrs work and they are considering SNF at the end of rehab. Caregiver Availability: Intermittent Discharge Plan Discussed with Primary Caregiver: Yes Is Caregiver In Agreement with Plan?: Yes Does Caregiver/Family have Issues with Lodging/Transportation while Pt is in Rehab?: No  Goals/Additional Needs Patient/Family Goal for Rehab: Min to Mod assist with PT, OT and SLP Expected length of stay: 20-28 days Cultural Considerations: Christian Dietary Needs: Dys 1, honey thick, spoon bites/sips only (note patient has partial dentures) Equipment Needs: to be determined Pt/Family  Agrees to Admission and willing to participate: Yes (spoke with pt and her two dtrs ) Program Orientation Provided & Reviewed with Pt/Caregiver Including Roles  & Responsibilities: Yes   Decrease burden of Care through IP rehab admission: NA   Possible need for SNF placement upon discharge: yes, likely as both dtrs work and they are not sure of other available family support.  Patient Condition: This patient's condition remains as documented in the consult dated 03-12-14, in which the Rehabilitation Physician determined and documented that the patient's condition is appropriate for intensive rehabilitative care in an inpatient rehabilitation facility. Will admit to inpatient rehab today.  Preadmission Screen Completed By:  Nanetta Batty,  PT, 03/13/2014 11:45 AM ______________________________________________________________________   Discussed status with Dr. Naaman Plummer on 03-13-14 at 1137 and received telephone approval for admission today.  Admission Coordinator:  Nanetta Batty, PT, time1137/Date 03-13-14

## 2014-03-13 NOTE — Progress Notes (Signed)
ANTICOAGULATION CONSULT NOTE - Follow Up Consult  Pharmacy Consult for Heparin  Indication: pulmonary embolus and DVT with CVA  No Known Allergies  Patient Measurements: Height: 5\' 5"  (165.1 cm) Weight: 175 lb 7.8 oz (79.6 kg) IBW/kg (Calculated) : 57  Vital Signs: Temp: 98.4 F (36.9 C) (03/08 0930) Temp Source: Oral (03/08 0930) BP: 113/54 mmHg (03/08 0930) Pulse Rate: 94 (03/08 0930)  Labs:  Recent Labs (last 2 labs)      Recent Labs  03/11/14 0301  03/12/14 0415 03/12/14 1900 03/13/14 0703  HGB 9.1* --  8.5* --  9.1*  HCT 28.9* --  27.4* --  28.7*  PLT 419* --  458* --  528*  HEPARINUNFRC 0.26* < > 0.14* 0.28* 0.22*  CREATININE 0.43* --  --  --  --   < > = values in this interval not displayed.    Estimated Creatinine Clearance: 59.4 mL/min (by C-G formula based on Cr of 0.43).  Assessment: 81 yo female with CVA and also noted with PE and DVT continues on heparin. Heparin is planned for 1 week (started 3/4) then switch / add po anticoagulation  Heparin level = 0.22 , CBC stable  Goal of Therapy:  Heparin level 0.25-0.35 units/ml Monitor platelets by anticoagulation protocol: Yes  Plan:  Continue  heparin to 1350 units / hr F/U HL @ 0030 Daily HL/CBC Monitor s/sx of bleeding  Hassie Bruce, Pharm. D. Clinical Pharmacy Resident Pager: [phone] Ph: [phone] 03/13/2014 4:57 PM

## 2014-03-13 NOTE — Progress Notes (Signed)
Physical Therapy Treatment Patient Details Name: Sandy Barrett MRN: [id number] DOB: 07-13-1932 Today's Date: 03/13/2014    History of Present Illness Patient here with acute onset of expressive aphasia with right-sided weakness that began approximately 1 hour prior to arrival. She has a history of hypertension, OA, and RTHR    PT Comments    Patient progressing well today and agreeable to OOB to recliner. Daughter present and very involved in patients care. Unable to take steps this session. Planning for CIR later today  Follow Up Recommendations  CIR     Equipment Recommendations       Recommendations for Other Services       Precautions / Restrictions Precautions Precautions: Fall Restrictions Other Position/Activity Restrictions: R shoulder with sublux    Mobility  Bed Mobility Overal bed mobility: Needs Assistance Bed Mobility: Sit to Supine     Supine to sit: Mod assist     General bed mobility comments: manage LEs and trunk. Cues for positioning and use of bed rails.   Transfers Overall transfer level: Needs assistance Equipment used: 2 person hand held assist Transfers: Sit to/from Stand Sit to Stand: Max assist;+2 physical assistance Stand pivot transfers: +2 physical assistance;Mod assist       General transfer comment: patient able to assist with pivot transfer and to shift weight anteriorly with transfer however still with some extensor tone in R LE. A to shift weight and manage hips to recliner  Ambulation/Gait                 Stairs            Wheelchair Mobility    Modified Rankin (Stroke Patients Only)       Balance     Sitting balance-Leahy Scale: Poor Sitting balance - Comments: Requires L UE assist with sitting     Standing balance-Leahy Scale: Zero                      Cognition Arousal/Alertness: Awake/alert Behavior During Therapy: Flat affect Overall Cognitive Status: Impaired/Different from  baseline Area of Impairment: Attention;Following commands;Problem solving   Current Attention Level: Sustained   Following Commands: Follows one step commands inconsistently     Problem Solving: Slow processing;Decreased initiation;Difficulty sequencing;Requires verbal cues;Requires tactile cues General Comments: pt with both expressive and receptive aphasia    Exercises      General Comments        Pertinent Vitals/Pain Pain Assessment: No/denies pain    Home Living                      Prior Function            PT Goals (current goals can now be found in the care plan section) Progress towards PT goals: Progressing toward goals    Frequency  Min 4X/week    PT Plan Current plan remains appropriate    Co-evaluation             End of Session Equipment Utilized During Treatment: Gait belt Activity Tolerance: Patient limited by fatigue;Patient tolerated treatment well Patient left: with call bell/phone within reach;with family/visitor present;in chair;with chair alarm set     Time: 0865-7846 PT Time Calculation (min) (ACUTE ONLY): 25 min  Charges:  $Therapeutic Activity: 23-37 mins                    G Codes:      Jacqualyn Posey 03/13/2014, 2:12  PM 03/13/2014 Jacqualyn Posey PTA [phone] pager [phone] office

## 2014-03-13 NOTE — H&P (Signed)
Physical Medicine and Rehabilitation Admission H&P   Chief Complaint  Patient presents with  . Right sided weakness, left gaze preference, dysphagia and aphasia.    HPI: Sandy Barrett is a 81 y.o. female with history of HTN, OA, Migraines, hyperlipidemia who was admitted on 03/08/14 with right sided weakness, difficulty talking and right facial droop. Patient had a hair appointment that day at 1300 hours and the hair dresser immediately noted she was confused and not talking correctly. CT head showed dense left MCA sign and Perfusion study showed a large area of penumbra involving the left MCA territory. Patient last seen normal at noon that day and tPA administered past CT chest negative for dissection. However incidental note of large thyroid mass displacing the trachea to the right, left internal jugular thormbus likely due to extrinsic compression by goiter, question right jugular thrombus and bilateral pulmonary emboli. Patient underwent cerebral angiogram with unsuccessful attempts at revascularization due to technical difficulties. MRI brain done revealing patchy areas of acute infarct L-MCA territory and severe punctate foci in R-MCA territory with possible trace SAH in high right frontal lobe. MRA brain with occluded L-ICA and L-MCA.   Carotid dopplers showed sluggish flow right jugular vein and left jugular vein thrombus--subacute and non-occlusive and suggestion of distal Left ICA occlusion. BLE dopplers revealed DVT R-distal CFV, FV, popliteal vein and DVT L-FV and popliteal vein. 2D echo revealed EF 65-70% with interatrial septal aneurysm. Patient started on IV heparin for treatment on 03/04 and tolerated extubated without difficulty. Protein C, Protein S and lupus anticoagulant negative. Surgery consult recommended for work up once patient stable and question of thyroid malignancy. Dr. Leonie Man recommends starting oral anticoagulant one week for stroke due r paradoxical  embolic. To repeat CCT on 03/10 and if negative for bleed to start Pradaxa. Swallow evaluation with moderate to severe oral phase deficits and patient started on dysphagia 1, honey liquids by tsp. Patient with resultant, right sided weakness, right visual deficits with left gaze preference, severe global aphasia and ideation apraxia. CIR recommended by MD and Rehab team. Patient has been cleared medically today and is admitted to CIR to help decrease burden of care.    Review of Systems  Unable to perform ROS: language  Musculoskeletal: Positive for myalgias and back pain.     Past Medical History  Diagnosis Date  . HYPERTENSION   . HYPERTHYROIDISM   . OBESITY   . ARTHRITIS   . Posttraumatic stress disorder   . INSOMNIA   . MIGRAINE HEADACHE   . Hyperthyroidism     s/p I-131 ablation 03/2011 of multinod goiter  . Arthritis    Past Surgical History  Procedure Laterality Date  . Abdominal hysterectomy  1976  . Total hip arthroplasty  1998     right  . Tonsillectomy    . Breast surgery      biopsy  . Radiology with anesthesia Left 03/08/2014    Procedure: RADIOLOGY WITH ANESTHESIA; Surgeon: Rob Hickman, MD; Location: [facility]; Service: Radiology; Laterality: Left;    Family History  Problem Relation Age of Onset  . Asthma Mother   . Asthma Father   . Prostate cancer Father     Social History: Widowed. Lives alone and independent PTA. 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 Has supportive daughter who work. Family reports that she has never smoked. She does not have any smokeless tobacco history on file. Family reports that she does not drink alcohol or use  illicit drugs.    Allergies: No Known Allergies    Medications Prior to Admission  Medication Sig Dispense Refill  . butalbital-acetaminophen-caffeine (FIORICET, ESGIC) 50-325-40 MG per tablet TAKE 1 TABLET BY MOUTH  TWICE A DAY AS NEEDED FOR HEADACHE 40 tablet 0  . diclofenac sodium (VOLTAREN) 1 % GEL Apply 2 g topically 3 (three) times daily. To affected joint. 100 g 11  . esomeprazole (NEXIUM) 40 MG capsule Take 1 capsule (40 mg total) by mouth daily. 90 capsule 0  . hydrochlorothiazide (HYDRODIURIL) 25 MG tablet Take 1 tablet (25 mg total) by mouth daily. 30 tablet 5  . meloxicam (MOBIC) 7.5 MG tablet Take 1 tablet (7.5 mg total) by mouth daily. 30 tablet 5  . polyethylene glycol powder (GLYCOLAX/MIRALAX) powder TAKE 17 G BY MOUTH DAILY. 3350 g 0  . Probiotic Product (ALIGN) 4 MG CAPS Take by mouth daily.     . propranolol (INDERAL) 20 MG tablet Take 1 tablet (20 mg total) by mouth 2 (two) times daily. 60 tablet 5  . traMADol (ULTRAM) 50 MG tablet Take 1 tablet (50 mg total) by mouth 3 (three) times daily as needed. 90 tablet 0  . traZODone (DESYREL) 50 MG tablet Take 1 tablet (50 mg total) by mouth at bedtime as needed for sleep. 30 tablet 1    Home: Home Living Family/patient expects to be discharged to:: Inpatient rehab Living Arrangements: Alone Type of Home: House Home Access: Stairs to enter Entrance Stairs-Number of Steps: 3 Entrance Stairs-Rails: Can reach both Home Layout: One level Home Equipment: Cane - single point Additional Comments: pt with bilateral knee OA, was having trouble getting up and down stairs but pt was independent, had driven herself to [employer] day of stroke  Functional History: Prior Function Level of Independence: Independent with assistive device(s) Comments: Driving  Functional Status:  Mobility: Bed Mobility Overal bed mobility: Needs Assistance Bed Mobility: Sit to Supine Supine to sit: Max assist Sit to supine: Max assist, +2 for physical assistance General bed mobility comments: manage LEs and trunk Transfers Overall transfer level: Needs assistance Equipment used: (2 person lift with gait  belt and pad) Transfers: Sit to/from Stand Sit to Stand: Max assist, +2 physical assistance Stand pivot transfers: +2 physical assistance, Mod assist General transfer comment: pt pulled self forward with L UE on chair. pt able to extend and pvt on L LE but R LE went into extensor tone and pt was unable to advance requriing maxA to manage R LE. completed stand x2  Ambulation/Gait General Gait Details: unable    ADL: ADL Overall ADL's : Needs assistance/impaired Eating/Feeding: NPO Grooming: Brushing hair Grooming Details (indicate cue type and reason): maximal A hand over hand supine in bed. Pt eventually just looked at me and shook her head no (which is the first Ive seen--usually shakes yes) Upper Body Bathing: Maximal assistance, Sitting Lower Body Bathing: Maximal assistance (with +2 Mod A sit<>stand) Upper Body Dressing : Total assistance, Sitting Lower Body Dressing: Total assistance (with +2 Mod A sit<>stand) Toilet Transfer: Moderate assistance, +2 for physical assistance, Stand-pivot (Bed>recliner going to pt's left) Toileting- Clothing Manipulation and Hygiene: Total assistance (with +2 Mod A sit<>stand) General ADL Comments: Reinforced positioning of RUE with daughters in room to help support arm due to sublux and for edema control. Also educated them on retrograde massage.  Cognition: Cognition Overall Cognitive Status: Impaired/Different from baseline Arousal/Alertness: Awake/alert Orientation Level: Oriented to person Attention: Focused Focused Attention: Impaired Focused Attention Impairment: Verbal  basic, Functional basic Memory: Impaired Memory Impairment: Storage deficit Awareness: Impaired Awareness Impairment: Intellectual impairment Problem Solving: Impaired Problem Solving Impairment: Verbal basic, Functional basic Safety/Judgment: Impaired Comments: requiring use of mittens Cognition Arousal/Alertness: Awake/alert Behavior During Therapy: Flat  affect Overall Cognitive Status: Impaired/Different from baseline Area of Impairment: Attention, Following commands, Problem solving Current Attention Level: Sustained Memory: Decreased short-term memory Following Commands: Follows one step commands inconsistently Problem Solving: Slow processing, Decreased initiation, Difficulty sequencing, Requires verbal cues, Requires tactile cues General Comments: pt with both expressive and receptive aphasia Difficult to assess due to: Impaired communication  Physical Exam: Blood pressure 113/54, pulse 94, temperature 98.4 F (36.9 C), temperature source Oral, resp. rate 20, height 5\' 5"  (1.651 m), weight 79.6 kg (175 lb 7.8 oz), SpO2 93 %. Physical Exam  Nursing note and vitals reviewed. Constitutional: She appears well-developed and well-nourished. obese HENT: oral mucosa sl dry Head: Normocephalic and atraumatic.  Eyes: Conjunctivae are normal. Pupils are equal, round, and reactive to light.  Neck: Normal range of motion. Neck supple.  Cardiovascular: Normal rate and regular rhythm.  Respiratory: Effort normal and breath sounds normal. No respiratory distress. She has no wheezes.  GI: Soft. Bowel sounds are normal. She exhibits no distension. There is no tenderness.  Musculoskeletal: She exhibits edema (1+ edema right hand and BUE. ).  Neurological: She is alert.  Left gaze preference with poor postural reflexes and right inattention. Able to sit upright today with attempts at positioning. decreased sensation right face. Right facial droop and tongue deviation. Severe expressive greater than receptive aphasia. Usually answers with yes or head nod regardless of question. Senses pain in rght arm and leg. Flexor withdrawal right arm and leg to pain provocation. No voluntary movement of right limbs. Able to follow occasional motor commands with visual and tactile cues. Moves left side spontaneously. Toes down  Skin: Skin is warm and dry.   Diffuse ecchymosis BUE, right antecubital area and resolving ecchymosis bilateral shins.     Lab Results Last 48 Hours    Results for orders placed or performed during the hospital encounter of 03/08/14 (from the past 48 hour(s))  Glucose, capillary Status: Abnormal   Collection Time: 03/11/14 12:02 PM  Result Value Ref Range   Glucose-Capillary 114 (H) 70 - 99 mg/dL  Heparin level (unfractionated) Status: None   Collection Time: 03/11/14 1:23 PM  Result Value Ref Range   Heparin Unfractionated 0.33 0.30 - 0.70 IU/mL    Comment:   IF HEPARIN RESULTS ARE BELOW EXPECTED VALUES, AND PATIENT DOSAGE HAS BEEN CONFIRMED, SUGGEST FOLLOW UP TESTING OF ANTITHROMBIN III LEVELS.   Glucose, capillary Status: Abnormal   Collection Time: 03/11/14 3:54 PM  Result Value Ref Range   Glucose-Capillary 109 (H) 70 - 99 mg/dL  Glucose, capillary Status: Abnormal   Collection Time: 03/11/14 8:01 PM  Result Value Ref Range   Glucose-Capillary 124 (H) 70 - 99 mg/dL  Occult blood card to lab, stool Status: None   Collection Time: 03/11/14 10:02 PM  Result Value Ref Range   Fecal Occult Bld NEGATIVE NEGATIVE  Glucose, capillary Status: Abnormal   Collection Time: 03/11/14 11:27 PM  Result Value Ref Range   Glucose-Capillary 105 (H) 70 - 99 mg/dL   Comment 1 Notify RN    Comment 2 Documented in Char   Glucose, capillary Status: Abnormal   Collection Time: 03/12/14 3:29 AM  Result Value Ref Range   Glucose-Capillary 101 (H) 70 - 99 mg/dL  CBC Status: Abnormal  Collection Time: 03/12/14 4:15 AM  Result Value Ref Range   WBC 10.5 4.0 - 10.5 K/uL   RBC 2.91 (L) 3.87 - 5.11 MIL/uL   Hemoglobin 8.5 (L) 12.0 - 15.0 g/dL   HCT 27.4 (L) 36.0 - 46.0 %   MCV 94.2 78.0 - 100.0 fL   MCH 29.2 26.0 - 34.0 pg   MCHC 31.0 30.0 - 36.0 g/dL   RDW  16.8 (H) 11.5 - 15.5 %   Platelets 458 (H) 150 - 400 K/uL  Heparin level (unfractionated) Status: Abnormal   Collection Time: 03/12/14 4:15 AM  Result Value Ref Range   Heparin Unfractionated 0.14 (L) 0.30 - 0.70 IU/mL    Comment:   IF HEPARIN RESULTS ARE BELOW EXPECTED VALUES, AND PATIENT DOSAGE HAS BEEN CONFIRMED, SUGGEST FOLLOW UP TESTING OF ANTITHROMBIN III LEVELS.   Glucose, capillary Status: Abnormal   Collection Time: 03/12/14 7:57 AM  Result Value Ref Range   Glucose-Capillary 100 (H) 70 - 99 mg/dL   Comment 1 Notify RN    Comment 2 Documented in Char   Glucose, capillary Status: Abnormal   Collection Time: 03/12/14 11:55 AM  Result Value Ref Range   Glucose-Capillary 106 (H) 70 - 99 mg/dL  Glucose, capillary Status: Abnormal   Collection Time: 03/12/14 3:49 PM  Result Value Ref Range   Glucose-Capillary 118 (H) 70 - 99 mg/dL  Heparin level (unfractionated) Status: Abnormal   Collection Time: 03/12/14 7:00 PM  Result Value Ref Range   Heparin Unfractionated 0.28 (L) 0.30 - 0.70 IU/mL    Comment:   IF HEPARIN RESULTS ARE BELOW EXPECTED VALUES, AND PATIENT DOSAGE HAS BEEN CONFIRMED, SUGGEST FOLLOW UP TESTING OF ANTITHROMBIN III LEVELS.   Glucose, capillary Status: Abnormal   Collection Time: 03/12/14 8:33 PM  Result Value Ref Range   Glucose-Capillary 123 (H) 70 - 99 mg/dL   Comment 1 Notify RN    Comment 2 Documented in Char   Glucose, capillary Status: Abnormal   Collection Time: 03/13/14 12:04 AM  Result Value Ref Range   Glucose-Capillary 106 (H) 70 - 99 mg/dL   Comment 1 Notify RN    Comment 2 Documented in Char   Glucose, capillary Status: None   Collection Time: 03/13/14 3:56 AM  Result Value Ref Range   Glucose-Capillary 95 70 - 99 mg/dL   Comment 1 Notify RN    Comment 2  Documented in Char   CBC Status: Abnormal   Collection Time: 03/13/14 7:03 AM  Result Value Ref Range   WBC 9.4 4.0 - 10.5 K/uL   RBC 3.05 (L) 3.87 - 5.11 MIL/uL   Hemoglobin 9.1 (L) 12.0 - 15.0 g/dL   HCT 28.7 (L) 36.0 - 46.0 %   MCV 94.1 78.0 - 100.0 fL   MCH 29.8 26.0 - 34.0 pg   MCHC 31.7 30.0 - 36.0 g/dL   RDW 17.2 (H) 11.5 - 15.5 %   Platelets 528 (H) 150 - 400 K/uL  Heparin level (unfractionated) Status: Abnormal   Collection Time: 03/13/14 7:03 AM  Result Value Ref Range   Heparin Unfractionated 0.22 (L) 0.30 - 0.70 IU/mL    Comment:   IF HEPARIN RESULTS ARE BELOW EXPECTED VALUES, AND PATIENT DOSAGE HAS BEEN CONFIRMED, SUGGEST FOLLOW UP TESTING OF ANTITHROMBIN III LEVELS.   Glucose, capillary Status: Abnormal   Collection Time: 03/13/14 8:29 AM  Result Value Ref Range   Glucose-Capillary 117 (H) 70 - 99 mg/dL      Imaging Results (Last 48  hours)    No results found.       Medical Problem List and Plan: 1. Functional deficits secondary to Large L-MCA infarct with dense right hemiplegia, dysphagia and aphasia.  2. DVT Prophylaxis/Anticoagulation: Pharmaceutical: Heparin--oral agent to start on 3/11 3. Pain Management: tramadol, tylenol, appropriate turning in bed, regular checks for comfort 4. Mood: LCSW to follow for evaluation and support.  5. Neuropsych: This patient is not capable of making decisions on her own behalf. 6. Skin/Wound Care: Turn patient every 4 hours. Rehab RN to monitor skin daily. Maintain adequate nutrition and hydration status.  7. Fluids/Electrolytes/Nutrition: Monitor I/O. IVF at nights to help maintain adequate hydration.  -will need a lot of encouragement and supervision to meet caloric needs. 8. Hypertension: normotensive at present      Post Admission Physician Evaluation: 1. Functional deficits secondary to large embolic left MCA  infarct with right hemiparesis, aphasia, dysphagia. 2. Patient is admitted to receive collaborative, interdisciplinary care between the physiatrist, rehab nursing staff, and therapy team. 3. Patient's level of medical complexity and substantial therapy needs in context of that medical necessity cannot be provided at a lesser intensity of care such as a SNF. 4. Patient has experienced substantial functional loss from his/her baseline which was documented above under the "Functional History" and "Functional Status" headings. Judging by the patient's diagnosis, physical exam, and functional history, the patient has potential for functional progress which will result in measurable gains while on inpatient rehab. These gains will be of substantial and practical use upon discharge in facilitating mobility and self-care at the household level. 5. Physiatrist will provide 24 hour management of medical needs as well as oversight of the therapy plan/treatment and provide guidance as appropriate regarding the interaction of the two. 6. 24 hour rehab nursing will assist with bladder management, bowel management, safety, skin/wound care, disease management, medication administration, pain management and patient education and help integrate therapy concepts, techniques,education, etc. 7. PT will assess and treat for/with: Lower extremity strength, range of motion, stamina, balance, functional mobility, safety, adaptive techniques and equipment, NMR, visual-perceptual and cognitive perceptual awareness. Goals are: mod assist. 8. OT will assess and treat for/with: ADL's, functional mobility, safety, upper extremity strength, adaptive techniques and equipment, NMR, visual and cognitive perceptual awareness, caregiver education, ROM, ego support. Goals are: mod to max assist. Therapy may not yet proceed with showering this patient. 9. SLP will assess and treat for/with: speech, cognition, language, swallowing,  communication. Goals are: mod to max assist. 10. Case Management and Social Worker will assess and treat for psychological issues and discharge planning. 11. Team conference will be held weekly to assess progress toward goals and to determine barriers to discharge. 12. Patient will receive at least 3 hours of therapy per day at least 5 days per week. 13. ELOS: 15-20 days to reduce burden of care for next venue  14. Prognosis: good     Meredith Staggers, MD, [facility] 03/13/2014

## 2014-03-13 NOTE — Progress Notes (Signed)
Report given to [facility] from [place]. Patient will be discharge to 4W20. All belongings sent with patient and family. Family at bedside.   Ave Filter, RN

## 2014-03-14 ENCOUNTER — Inpatient Hospital Stay (HOSPITAL_COMMUNITY): Payer: Medicare Other | Admitting: Speech Pathology

## 2014-03-14 ENCOUNTER — Inpatient Hospital Stay (HOSPITAL_COMMUNITY): Payer: Medicare Other | Admitting: Occupational Therapy

## 2014-03-14 ENCOUNTER — Inpatient Hospital Stay (HOSPITAL_COMMUNITY): Payer: Medicare Other | Admitting: Physical Therapy

## 2014-03-14 DIAGNOSIS — I69391 Dysphagia following cerebral infarction: Secondary | ICD-10-CM

## 2014-03-14 DIAGNOSIS — IMO0002 Reserved for concepts with insufficient information to code with codable children: Secondary | ICD-10-CM

## 2014-03-14 DIAGNOSIS — I2699 Other pulmonary embolism without acute cor pulmonale: Secondary | ICD-10-CM

## 2014-03-14 DIAGNOSIS — I82403 Acute embolism and thrombosis of unspecified deep veins of lower extremity, bilateral: Secondary | ICD-10-CM | POA: Diagnosis present

## 2014-03-14 DIAGNOSIS — I82890 Acute embolism and thrombosis of other specified veins: Secondary | ICD-10-CM

## 2014-03-14 DIAGNOSIS — R4701 Aphasia: Secondary | ICD-10-CM | POA: Diagnosis present

## 2014-03-14 LAB — GLUCOSE, CAPILLARY: Glucose-Capillary: 116 mg/dL — ABNORMAL HIGH (ref 70–99)

## 2014-03-14 LAB — CBC WITH DIFFERENTIAL/PLATELET
Basophils Absolute: 0.1 10*3/uL (ref 0.0–0.1)
Basophils Relative: 1 % (ref 0–1)
Eosinophils Absolute: 0.2 10*3/uL (ref 0.0–0.7)
Eosinophils Relative: 2 % (ref 0–5)
HCT: 27.8 % — ABNORMAL LOW (ref 36.0–46.0)
Hemoglobin: 8.8 g/dL — ABNORMAL LOW (ref 12.0–15.0)
Lymphocytes Relative: 11 % — ABNORMAL LOW (ref 12–46)
Lymphs Abs: 0.9 10*3/uL (ref 0.7–4.0)
MCH: 29.8 pg (ref 26.0–34.0)
MCHC: 31.7 g/dL (ref 30.0–36.0)
MCV: 94.2 fL (ref 78.0–100.0)
Monocytes Absolute: 0.7 10*3/uL (ref 0.1–1.0)
Monocytes Relative: 8 % (ref 3–12)
Neutro Abs: 6.7 10*3/uL (ref 1.7–7.7)
Neutrophils Relative %: 78 % — ABNORMAL HIGH (ref 43–77)
Platelets: 509 10*3/uL — ABNORMAL HIGH (ref 150–400)
RBC: 2.95 MIL/uL — ABNORMAL LOW (ref 3.87–5.11)
RDW: 17.6 % — ABNORMAL HIGH (ref 11.5–15.5)
WBC: 8.6 10*3/uL (ref 4.0–10.5)

## 2014-03-14 LAB — RETICULOCYTES
RBC.: 3.41 MIL/uL — ABNORMAL LOW (ref 3.87–5.11)
Retic Count, Absolute: 201.2 10*3/uL — ABNORMAL HIGH (ref 19.0–186.0)
Retic Ct Pct: 5.9 % — ABNORMAL HIGH (ref 0.4–3.1)

## 2014-03-14 LAB — COMPREHENSIVE METABOLIC PANEL
ALT: 10 U/L (ref 0–35)
AST: 17 U/L (ref 0–37)
Albumin: 2.4 g/dL — ABNORMAL LOW (ref 3.5–5.2)
Alkaline Phosphatase: 48 U/L (ref 39–117)
Anion gap: 3 — ABNORMAL LOW (ref 5–15)
BUN: 6 mg/dL (ref 6–23)
CO2: 26 mmol/L (ref 19–32)
Calcium: 8.3 mg/dL — ABNORMAL LOW (ref 8.4–10.5)
Chloride: 113 mmol/L — ABNORMAL HIGH (ref 96–112)
Creatinine, Ser: 0.46 mg/dL — ABNORMAL LOW (ref 0.50–1.10)
GFR calc Af Amer: >90 mL/min (ref >90)
GFR calc non Af Amer: >90 mL/min (ref >90)
Glucose, Bld: 101 mg/dL — ABNORMAL HIGH (ref 70–99)
Potassium: 3.5 mmol/L (ref 3.5–5.1)
Sodium: 142 mmol/L (ref 135–145)
Total Bilirubin: 1.3 mg/dL — ABNORMAL HIGH (ref 0.3–1.2)
Total Protein: 4.6 g/dL — ABNORMAL LOW (ref 6.0–8.3)

## 2014-03-14 LAB — CARDIOLIPIN ANTIBODIES, IGG, IGM, IGA
Anticardiolipin IgA: <9 APL U/mL (ref 0–11)
Anticardiolipin IgG: <9 GPL U/mL (ref 0–14)
Anticardiolipin IgM: <9 MPL U/mL (ref 0–12)

## 2014-03-14 LAB — PROTHROMBIN GENE MUTATION

## 2014-03-14 LAB — HEPARIN LEVEL (UNFRACTIONATED): Heparin Unfractionated: 0.27 IU/mL — ABNORMAL LOW (ref 0.30–0.70)

## 2014-03-14 MED ORDER — ENSURE PUDDING PO PUDG
1.0000 | Freq: Three times a day (TID) | ORAL | Status: DC
  Administered 2014-03-14 – 2014-04-05 (×61): 1 via ORAL

## 2014-03-14 NOTE — Progress Notes (Signed)
Recreational Therapy Session Note  Patient Details  Name: Sandy Barrett MRN: [id number] Date of Birth: June 27, 1932 Today's Date: 03/14/2014  Order received & chart reviewed.  Full eval to follow early next week.  [place] 03/14/2014, 5:03 PM

## 2014-03-14 NOTE — Evaluation (Addendum)
Speech Language Pathology Assessment and Plan  Patient Details  Name: Sandy Barrett MRN: [id number] Date of Birth: October 22, 1932  SLP Diagnosis: Aphasia;Dysphagia;Cognitive Impairments;Apraxia  Rehab Potential: Fair ELOS: 21-28 days     Today's Date: 03/14/2014 SLP Individual Time: 0905-1005 SLP Individual Time Calculation (min): 60 min   Problem List:  Patient Active Problem List   Diagnosis Date Noted  . Acute pulmonary embolism 03/14/2014  . DVT of lower extremity, bilateral 03/14/2014  . Global aphasia 03/14/2014  . 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 Dysphagia due to recent stroke 03/14/2014  . Aphasia due to stroke 03/13/2014  . Right hemiparesis 03/13/2014  . Embolic stroke   . Cerebral infarction due to embolism of left middle cerebral artery   . Cerebral infarction due to unspecified mechanism   . Respiratory failure   . Cerebral infarction due to thrombosis of right middle cerebral artery   . Stroke 03/08/2014  . Pulmonary embolism 03/08/2014  . CVA (cerebral infarction)   . Right sided weakness   . Back pain 08/29/2013  . Primary localized osteoarthrosis, lower leg 03/06/2013  . IBS (irritable bowel syndrome)   . 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 Multinodular goiter 01/13/2011  . Abnormal chest x-ray 12/12/2010  . HYPERTHYROIDISM 11/11/2009  . INSOMNIA 07/03/2009  . POSTTRAUMATIC STRESS DISORDER 08/22/2008  . OBESITY 04/21/2008  . OSTEOARTHRITIS 04/21/2008  . MIGRAINE HEADACHE 04/20/2008  . Essential hypertension 04/20/2008   Past Medical History:  Past Medical History  Diagnosis Date  . HYPERTENSION   . HYPERTHYROIDISM   . OBESITY   . ARTHRITIS   . Posttraumatic stress disorder   . INSOMNIA   . MIGRAINE HEADACHE   . Hyperthyroidism     s/p I-131 ablation 03/2011 of multinod goiter  . Arthritis    Past Surgical History:  Past Surgical History  Procedure Laterality Date  . Abdominal hysterectomy  1976  . Total hip arthroplasty  1998      right  . Tonsillectomy    . Breast surgery      biopsy  . Radiology with anesthesia Left 03/08/2014    Procedure: RADIOLOGY WITH ANESTHESIA;  Surgeon: Rob Hickman, MD;  Location: [place];  Service: Radiology;  Laterality: Left;    Assessment / Plan / Recommendation Clinical Impression   Sandy Barrett is a 81 y.o. female with history of HTN, OA, Migraines, hyperlipidemia who was admitted on 03/08/14 with right sided weakness, difficulty talking and right facial droop. CT head showed dense left MCA sign. Incidental note of large thyroid mass displacing the trachea to the right, left internal jugular thormbus likely due to extrinsic compression by goiter, question right jugular thrombus and bilateral pulmonary emboli.  MRI brain done revealing patchy areas of acute infarct L-MCA territory and severe punctate foci in R-MCA territory with possible trace SAH in high right frontal lobe. Surgery consult recommended for work up once patient stable and question of thyroid malignancy.Swallow evaluation with moderate to severe oral phase deficits and patient started on dysphagia 1, honey liquids by tsp. Patient with right sided weakness, right visual deficits with left gaze preference, severe global aphasia and ideational apraxia.  Patient was cleared medically and was admitted to CIR on 03/13/2014 to help decrease burden of care. SLP evaluation was completed on 03/14/2014 with the following results: Pt presents with a s/s of a moderately severe oropharyngeal dysphagia which is complicated by cognitive components.  Pt was noted with overt s/s of aspiration with thin liquids characterized by delayed cough,  multiple swallows, and suspected delayed swallow initiation.  Pt was also noted with wet vocal quality following honey thick liquids via teaspoon, which SLP suspects to be related to impulsivity with poor rate and portion control when pt was feeding herself.  Vocal quality cleared with SLP intervention for small  bites/sips and slow rate. Recommend that pt continue on dys 1 textures with honey thick liquids and full supervision for use of swallowing precautions.   Additionally, pt presents with a severe global aphasia and is unable to accurately answer yes/no biographical questions, follow 1-step commands or verbally convey needs/wants.  Pt was noted to perseveratively open and close mouth during trials of automatic sequences.  Pt was also noted with oral and ideational apraxia with little awareness of errors.  Pt also exhibits significant cognitive deficits occurring as a function of decreased focused attention and right inattention. Per report from daughter, [name], pt was very independence prior to admission and has experienced a significant loss in function as a result of her CVA.  Therefore, pt would benefit from skilled ST while inpatient in order to maximize functional independence and reduce burden of care prior to discharge.  Anticipate that pt will need follow up speech services at next level of care, which will likely be SNF per family's report.    Skilled Therapeutic Interventions          Cognitive-linguistic and bedside swallowing evaluation completed with results and recommendations reviewed with family.     SLP Assessment  Patient will need skilled Speech Lanaguage Pathology Services during CIR admission    Recommendations  Recommended Consults: MBS Diet Recommendations: Dysphagia 1 (Puree);Honey-thick liquid Liquid Administration via: Spoon Medication Administration: Crushed with puree Supervision: Full supervision/cueing for compensatory strategies;Staff to assist with self feeding Compensations: Slow rate;Small sips/bites Postural Changes and/or Swallow Maneuvers: Seated upright 90 degrees Oral Care Recommendations: Oral care BID Patient destination: [place] (SNF) Follow up Recommendations: Skilled Nursing facility;24 hour supervision/assistance    SLP Frequency 3 to 5  out of 7 days   SLP Treatment/Interventions Cognitive remediation/compensation;Cueing hierarchy;Dysphagia/aspiration precaution training;Functional tasks;Patient/family education;Speech/Language facilitation;Multimodal communication approach;Internal/external aids;Environmental controls    Pain Pain Assessment Pain Assessment: Faces Faces Pain Scale: No hurt Prior Functioning Cognitive/Linguistic Baseline: Within functional limits Type of Home: House  Lives With: Alone Available Help at Discharge: Family;Skilled Nursing Facility Vocation: Retired  Industrial/product designer Term Goals: Week 1: SLP Short Term Goal 1 (Week 1): Pt will improve accuracy of yes/no responses to basic, biographical questions to >50% with max assist multimodal cuing.   SLP Short Term Goal 2 (Week 1): Pt will improve focused attention to therapist or during basic, familiar tasks for 15-30 seconds with max assist.  SLP Short Term Goal 3 (Week 1): Pt will complete verbal automatic sequences for 25% accuracy with max assist multimodal cues.   SLP Short Term Goal 4 (Week 1): Pt will follow 1 step commands during basic, familiar tasks for 50% accuracy with max assist multimodal cuing.   SLP Short Term Goal 5 (Week 1): Pt will scan to the right of midline during basic, familiar tasks over ~50% of observable opportunities with max multimodal cues.    See FIM for current functional status Refer to Care Plan for Long Term Goals  Recommendations for other services: None  Discharge Criteria: Patient will be discharged from SLP if patient refuses treatment 3 consecutive times without medical reason, if treatment goals not met, if there is a change in medical status, if patient makes no progress towards  goals or if patient is discharged from hospital.  The above assessment, treatment plan, treatment alternatives and goals were discussed and mutually agreed upon: by family  Emilio Math 03/14/2014, 3:39 PM

## 2014-03-14 NOTE — Progress Notes (Signed)
Meredith Staggers, MD Physician Signed Physical Medicine and Rehabilitation Consult Note 03/12/2014 9:40 AM  Related encounter: ED to Hosp-Admission (Discharged) from 03/08/2014 in [place] Collapse All        Physical Medicine and Rehabilitation Consult  Reason for Consult: Right sided weakness, global aphasia and right visual field deficits.  Referring Physician: Dr. Leonie Man.    HPI: Sandy Barrett is a 81 y.o. female with history of HTN, OA, hyperlipidemia who was admitted on 03/08/14 with right sided weakness, difficulty talking and right facial droop. Patient had a hair appointment that day at 1300 hours and the hair dresser immediately noted she was confused and not talking correctly. CT head showed dense left MCA sign and Perfusion study showed a large area of penumbra involving the left MCA territory. Patient last seen normal at noon that day and tPA administered past CT chest negative for dissection. However incidental note of large thyroid mass as well as bilateral pulmonary emboli. Patient underwent cerebral angiogram with unsuccessful attempts at revascularization due to technical difficulties. Carotid dopplers with sluggish flow right jugular vein and left jegular vein thrombus--subacute and non-occlusive. BLE dopplers with DVT R-distal CFV, FV, popliteal vein and DVT L-FV and popliteal vein. Patient started on IV heparin for treatment on 03/04 and tolerated extubated without difficulty. Swallow evaluation with moderate to severe oral phase deficits and NPO recommended. Patient with resultant, right sided weakness, right visual deficits with left gaze preference and severe global aphasia with apraxia. CIR recommended by MD and Rehab team.    ROS    Past Medical History  Diagnosis Date  . HYPERTENSION   . HYPERTHYROIDISM   . OBESITY   . ARTHRITIS   . Posttraumatic stress disorder   . INSOMNIA   .  MIGRAINE HEADACHE   . Hyperthyroidism     s/p I-131 ablation 03/2011 of multinod goiter  . Arthritis     Past Surgical History  Procedure Laterality Date  . Abdominal hysterectomy  1976  . Total hip arthroplasty  1998     right  . Tonsillectomy    . Breast surgery      biopsy  . Radiology with anesthesia Left 03/08/2014    Procedure: RADIOLOGY WITH ANESTHESIA; Surgeon: Rob Hickman, MD; Location: [facility]; Service: Radiology; Laterality: Left;    Family History  Problem Relation Age of Onset  . Asthma Mother   . Asthma Father   . Prostate cancer Father     Social History:  reports that she has never smoked. She does not have any smokeless tobacco history on file. She reports that she does not drink alcohol or use illicit drugs.    Allergies: No Known Allergies    Medications Prior to Admission  Medication Sig Dispense Refill  . butalbital-acetaminophen-caffeine (FIORICET, ESGIC) 50-325-40 MG per tablet TAKE 1 TABLET BY MOUTH TWICE A DAY AS NEEDED FOR HEADACHE 40 tablet 0  . diclofenac sodium (VOLTAREN) 1 % GEL Apply 2 g topically 3 (three) times daily. To affected joint. 100 g 11  . esomeprazole (NEXIUM) 40 MG capsule Take 1 capsule (40 mg total) by mouth daily. 90 capsule 0  . hydrochlorothiazide (HYDRODIURIL) 25 MG tablet Take 1 tablet (25 mg total) by mouth daily. 30 tablet 5  . meloxicam (MOBIC) 7.5 MG tablet Take 1 tablet (7.5 mg total) by mouth daily. 30 tablet 5  . polyethylene glycol powder (GLYCOLAX/MIRALAX) powder TAKE 17 G BY MOUTH DAILY. [place]  g 0  . Probiotic Product (ALIGN) 4 MG CAPS Take by mouth daily.     . propranolol (INDERAL) 20 MG tablet Take 1 tablet (20 mg total) by mouth 2 (two) times daily. 60 tablet 5  . traMADol (ULTRAM) 50 MG tablet Take 1 tablet (50 mg total) by mouth 3 (three) times daily as needed. 90 tablet 0  . traZODone  (DESYREL) 50 MG tablet Take 1 tablet (50 mg total) by mouth at bedtime as needed for sleep. 30 tablet 1    Home: Home Living Family/patient expects to be discharged to:: Inpatient rehab Living Arrangements: Alone Type of Home: House Home Access: Stairs to enter Entrance Stairs-Number of Steps: 3 Entrance Stairs-Rails: Can reach both Home Layout: One level Home Equipment: Cane - single point Additional Comments: pt with bilateral knee OA, was having trouble getting up and down stairs but pt was independent, had driven herself to [employer] day of stroke  Functional History: Prior Function Level of Independence: Independent with assistive device(s) Comments: Driving Functional Status:  Mobility: Bed Mobility Overal bed mobility: Needs Assistance, +2 for physical assistance Bed Mobility: Supine to Sit Supine to sit: Mod assist, +2 for physical assistance General bed mobility comments: mod A for rolling left and trunk elevation, limited mvmt of right LE due to right knee discomfort (facial grimace) Transfers Overall transfer level: Needs assistance Equipment used: 2 person hand held assist Transfers: Sit to/from Stand, Stand Pivot Transfers Sit to Stand: +2 physical assistance, Mod assist Stand pivot transfers: Mod assist, +2 physical assistance General transfer comment: pt was able to wt-shift fwd and push through LLE>RLE to achieve full standing. Pivoted left foot and extended through left hip to move back toward chair. Mod support given on right side to prevent collapse to that side Ambulation/Gait General Gait Details: unable    ADL: ADL Overall ADL's : Needs assistance/impaired Eating/Feeding: NPO Grooming: Maximal assistance, Sitting Upper Body Bathing: Maximal assistance, Sitting Lower Body Bathing: Maximal assistance (with +2 Mod A sit<>stand) Upper Body Dressing : Total assistance, Sitting Lower Body Dressing: Total assistance (with +2 Mod A  sit<>stand) Toilet Transfer: Moderate assistance, +2 for physical assistance, Stand-pivot (bed>recliner going to pt's left) Toileting- Clothing Manipulation and Hygiene: Total assistance (with +2 Mod A sit<>stand)  Cognition: Cognition Overall Cognitive Status: Difficult to assess Arousal/Alertness: Awake/alert Orientation Level: Other (comment) (UTA; pt with global aphasia.) Attention: Focused Focused Attention: Impaired Focused Attention Impairment: Verbal basic, Functional basic Memory: Impaired Memory Impairment: Storage deficit Awareness: Impaired Awareness Impairment: Intellectual impairment Problem Solving: Impaired Problem Solving Impairment: Verbal basic, Functional basic Safety/Judgment: Impaired Comments: requiring use of mittens Cognition Arousal/Alertness: Awake/alert Behavior During Therapy: WFL for tasks assessed/performed Overall Cognitive Status: Difficult to assess Area of Impairment: Following commands Following Commands: Follows one step commands inconsistently General Comments: difficult to assess due to expressive and receptive difficulties, daughters report that pt was cognitively in tact before stroke. Pt followed one step commands 50% of time with multiple multimodal cues  Difficult to assess due to: Impaired communication  Blood pressure 102/52, pulse 91, temperature 98.7 F (37.1 C), temperature source Oral, resp. rate 21, height 5\' 5"  (1.651 m), weight 79.6 kg (175 lb 7.8 oz), SpO2 96 %. Physical Exam  Constitutional: No distress.  HENT:  Head: Normocephalic and atraumatic.  Neck: No thyromegaly present.  Cardiovascular: Normal rate.  Respiratory: Effort normal.  GI: She exhibits no distension.  Musculoskeletal: She exhibits no edema or tenderness.  Neurological:  Right facial droop and tongue deviation. Severe  expressive greater than receptive aphasia. Usually answers with yes or head nod regardless of question. Senses pain in rght arm and leg.  Flexor withdrawal right arm and leg to pain provocation. No voluntary movement of right limbs. Moves left side spontaneously. Toes down  Skin: Skin is warm. She is not diaphoretic.  Psychiatric:  flat      Lab Results Last 24 Hours    Results for orders placed or performed during the hospital encounter of 03/08/14 (from the past 24 hour(s))  Glucose, capillary Status: Abnormal   Collection Time: 03/11/14 12:02 PM  Result Value Ref Range   Glucose-Capillary 114 (H) 70 - 99 mg/dL  Heparin level (unfractionated) Status: None   Collection Time: 03/11/14 1:23 PM  Result Value Ref Range   Heparin Unfractionated 0.33 0.30 - 0.70 IU/mL  Glucose, capillary Status: Abnormal   Collection Time: 03/11/14 3:54 PM  Result Value Ref Range   Glucose-Capillary 109 (H) 70 - 99 mg/dL  Occult blood card to lab, stool Status: None   Collection Time: 03/11/14 10:02 PM  Result Value Ref Range   Fecal Occult Bld NEGATIVE NEGATIVE  CBC Status: Abnormal   Collection Time: 03/12/14 4:15 AM  Result Value Ref Range   WBC 10.5 4.0 - 10.5 K/uL   RBC 2.91 (L) 3.87 - 5.11 MIL/uL   Hemoglobin 8.5 (L) 12.0 - 15.0 g/dL   HCT 27.4 (L) 36.0 - 46.0 %   MCV 94.2 78.0 - 100.0 fL   MCH 29.2 26.0 - 34.0 pg   MCHC 31.0 30.0 - 36.0 g/dL   RDW 16.8 (H) 11.5 - 15.5 %   Platelets 458 (H) 150 - 400 K/uL  Heparin level (unfractionated) Status: Abnormal   Collection Time: 03/12/14 4:15 AM  Result Value Ref Range   Heparin Unfractionated 0.14 (L) 0.30 - 0.70 IU/mL     Ct Head Wo Contrast  03/11/2014 CLINICAL DATA: Follow-up examination for acute stroke, status post unsuccessful attempt at revascularization. EXAM: CT HEAD WITHOUT CONTRAST TECHNIQUE: Contiguous axial images were obtained from the base of the skull through the vertex without intravenous contrast. COMPARISON: Prior MRI from  03/09/2014. FINDINGS: Diffuse hypodensity with blurring of the gray-white matter differentiation seen throughout the left MCA territory, compatible with evolving acute ischemic left MCA territory infarct. There is involvement of the left basal ganglia. Overall, distribution is similar relative to prior MRI, although there is increased cytotoxic edema. There is partial effacement of the left lateral ventricle due to edema without midline shift. No hydrocephalus. There is partial effacement of the left perimesencephalic cistern. Basilar cisterns otherwise patent. No hemorrhagic transformation within the left MCA territory infarct. Left M1 segment remains dense. Previously identified small are punctate hit infarcts within the right cerebral hemisphere not well visualized. There is a probable small focus of subarachnoid hemorrhage along the central sulcus of the right cerebral hemisphere (series 201, image 25, stable from prior MRI. No other intracranial hemorrhage. No new infarct. No extra-axial fluid collection. Calvarium intact. No acute abnormality about the orbits. Polypoid opacity present within the inferior left maxillary sinus. Mucoperiosteal thickening present within the ethmoidal air cells. Mastoid air cells clear. IMPRESSION: 1. Interval evolution of acute left MCA territory infarct with increased edema as compared to prior MRI from 03/09/2014. There is mild mass effect on the left lateral ventricle without midline shift. No hemorrhagic transformation. 2. Small focus of probable subarachnoid hemorrhage along the central sulcus of the right cerebral hemisphere, stable from previous MRI. No new hemorrhage. 3. No  new infarct or other acute abnormality. Electronically Signed By: Jeannine Boga M.D. On: 03/11/2014 06:21    Assessment/Plan: Diagnosis: large left MCA territory infarct with dense right hemiparesis and aphasia 1. Does the need for close, 24 hr/day medical supervision in  concert with the patient's rehab needs make it unreasonable for this patient to be served in a less intensive setting? Yes 2. Co-Morbidities requiring supervision/potential complications: htn, PE 3. Due to bladder management, bowel management, safety, skin/wound care, disease management, medication administration, pain management and patient education, does the patient require 24 hr/day rehab nursing? Yes 4. Does the patient require coordinated care of a physician, rehab nurse, PT (1-2 hrs/day, 5 days/week), OT (1-2 hrs/day, 5 days/week) and SLP (1-2 hrs/day, 5 days/week) to address physical and functional deficits in the context of the above medical diagnosis(es)? Yes Addressing deficits in the following areas: balance, endurance, locomotion, strength, transferring, bowel/bladder control, bathing, dressing, feeding, grooming, toileting, cognition, speech, language, swallowing and psychosocial support 5. Can the patient actively participate in an intensive therapy program of at least 3 hrs of therapy per day at least 5 days per week? Yes 6. The potential for patient to make measurable gains while on inpatient rehab is good 7. Anticipated functional outcomes upon discharge from inpatient rehab are min assist and mod assist with PT, min assist and mod assist with OT, min assist and mod assist with SLP. 8. Estimated rehab length of stay to reach the above functional goals is: 20-28 days 9. Does the patient have adequate social supports and living environment to accommodate these discharge functional goals? Yes 10. Anticipated D/C setting: Home  11. Anticipated post D/C treatments: [place] therapy 12. Overall Rehab/Functional Prognosis: excellent  RECOMMENDATIONS: This patient's condition is appropriate for continued rehabilitative care in the following setting: CIR Patient has agreed to participate in recommended program. Yes Note that insurance prior authorization may be required for reimbursement for  recommended care.  Comment: Rehab Admissions Coordinator to follow up. Family appears supportive and is working on disposition plan  Thanks,  Meredith Staggers, MD, [facility]     03/12/2014       Revision History     Date/Time User Provider Type Action   03/12/2014 10:10 AM Meredith Staggers, MD Physician Sign   03/12/2014 10:01 AM [name], PA-C Physician Assistant [place]   View Details Report       Routing History     Date/Time From To Method   03/12/2014 10:10 AM Meredith Staggers, MD Meredith Staggers, MD In Basket   03/12/2014 10:10 AM Meredith Staggers, MD Rowe Clack, MD In [place]
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
## 2014-03-14 NOTE — Care Management Note (Signed)
Inpatient Rehabilitation Center Individual Statement of Services  Patient Name:  Sandy Barrett  Date:  03/14/2014  Welcome to the [facility].  Our goal is to provide you with an individualized program based on your diagnosis and situation, designed to meet your specific needs.  With this comprehensive rehabilitation program, you will be expected to participate in at least 3 hours of rehabilitation therapies Monday-Friday, with modified therapy programming on the weekends.  Your rehabilitation program will include the following services:  Physical Therapy (PT), Occupational Therapy (OT), Speech Therapy (ST), 24 hour per day rehabilitation nursing, Case Management (Social Worker), Rehabilitation Medicine, Nutrition Services and Pharmacy Services  Weekly team conferences will be held on Wednesday to discuss your progress.  Your Social Worker will talk with you frequently to get your input and to update you on team discussions.  Team conferences with you and your family in attendance may also be held.  Expected length of stay: 24-28 days  Overall anticipated outcome: mod assist level  Depending on your progress and recovery, your program may change. Your Social Worker will coordinate services and will keep you informed of any changes. Your Social Worker's name and contact numbers are listed  below.  The following services may also be recommended but are not provided by the [facility] will be made to provide these services after discharge if needed.  Arrangements include referral to agencies that provide these services.  Your insurance has been verified to be:  [place] Your primary doctor is:  Interior and spatial designer  Pertinent information will be shared with your doctor and your insurance company.  Social Worker:  Ovidio Kin, [place]  or ([phone]  Information discussed with and copy given to patient by: Elease Hashimoto, 03/14/2014, 1:30 PM

## 2014-03-14 NOTE — Progress Notes (Signed)
ANTICOAGULATION CONSULT NOTE - Follow Up Consult  Pharmacy Consult for Heparin  Indication: pulmonary embolus and DVT with CVA  No Known Allergies  Patient Measurements:    Vital Signs: Temp: 98.9 F (37.2 C) (03/09 0516) Temp Source: Oral (03/09 0516) BP: 122/50 mmHg (03/09 0516) Pulse Rate: 91 (03/09 0516)  Labs:  Recent Labs  03/12/14 0415 03/12/14 1900 03/13/14 0703 03/14/14 0423  HGB 8.5*  --  9.1* 8.8*  HCT 27.4*  --  28.7* 27.8*  PLT 458*  --  528* 509*  HEPARINUNFRC 0.14* 0.28* 0.22* 0.27*  CREATININE  --   --   --  0.46*    Estimated Creatinine Clearance: 59.4 mL/min (by C-G formula based on Cr of 0.46).   Assessment: 81 yo female with CVA and also noted with PE and DVT continues on heparin.  Heparin is therapeutic = 0.27 on 1350 units/hr, with lower goal d/t recent stroke. Hgb 8.8, low stable, plt 509K. Planned for 1 week of IV heparin (started 3/4) then switch / add po anticoagulation  Goal of Therapy:  Heparin level 0.25-0.35 units/ml Monitor platelets by anticoagulation protocol: Yes   Plan:  Continue heparin to 1350 units / hr Daily HL/CBC Monitor s/sx of bleeding F/u switching to oral anticoagulation on 3/11  Maryanna Shape, PharmD, BCPS  Clinical Pharmacist  Pager: [phone]  03/14/2014 11:39 AM

## 2014-03-14 NOTE — Evaluation (Addendum)
Physical Therapy Assessment and Plan  Patient Details  Name: Sandy Barrett MRN: [id number] Date of Birth: 07-Mar-1932  PT Diagnosis: Abnormal posture, Hemiplegia dominant, Impaired cognition and Impaired sensation Rehab Potential: Good ELOS: 28 days   Today's Date: 03/14/2014 PT Individual Time: 7672-0947 PT Individual Time Calculation (min): 63 min    Problem List:  Patient Active Problem List   Diagnosis Date Noted  . Acute pulmonary embolism 03/14/2014  . DVT of lower extremity, bilateral 03/14/2014  . Global aphasia 03/14/2014  . 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 Dysphagia due to recent stroke 03/14/2014  . Aphasia due to stroke 03/13/2014  . Right hemiparesis 03/13/2014  . Embolic stroke   . Cerebral infarction due to embolism of left middle cerebral artery   . Cerebral infarction due to unspecified mechanism   . Respiratory failure   . Cerebral infarction due to thrombosis of right middle cerebral artery   . Stroke 03/08/2014  . Pulmonary embolism 03/08/2014  . CVA (cerebral infarction)   . Right sided weakness   . Back pain 08/29/2013  . Primary localized osteoarthrosis, lower leg 03/06/2013  . IBS (irritable bowel syndrome)   . 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 Multinodular goiter 01/13/2011  . Abnormal chest x-ray 12/12/2010  . HYPERTHYROIDISM 11/11/2009  . INSOMNIA 07/03/2009  . POSTTRAUMATIC STRESS DISORDER 08/22/2008  . OBESITY 04/21/2008  . OSTEOARTHRITIS 04/21/2008  . MIGRAINE HEADACHE 04/20/2008  . Essential hypertension 04/20/2008    Past Medical History:  Past Medical History  Diagnosis Date  . HYPERTENSION   . HYPERTHYROIDISM   . OBESITY   . ARTHRITIS   . Posttraumatic stress disorder   . INSOMNIA   . MIGRAINE HEADACHE   . Hyperthyroidism     s/p I-131 ablation 03/2011 of multinod goiter  . Arthritis    Past Surgical History:  Past Surgical History  Procedure Laterality Date  . Abdominal hysterectomy  1976  . Total hip  arthroplasty  1998     right  . Tonsillectomy    . Breast surgery      biopsy  . Radiology with anesthesia Left 03/08/2014    Procedure: RADIOLOGY WITH ANESTHESIA;  Surgeon: Rob Hickman, MD;  Location: [facility];  Service: Radiology;  Laterality: Left;    Assessment & Plan Clinical Impression:Sandy Barrett is a 81 y.o. female with history of HTN, OA, Migraines, hyperlipidemia who was admitted on 03/08/14 with right sided weakness, difficulty talking and right facial droop. Patient had a hair appointment that day at 1300 hours and the hair dresser immediately noted she was confused and not talking correctly. CT head showed dense left MCA sign and Perfusion study showed a large area of penumbra involving the left MCA territory. Patient last seen normal at noon that day and tPA administered past CT chest negative for dissection. However incidental note of large thyroid mass displacing the trachea to the right, left internal jugular thormbus likely due to extrinsic compression by goiter, question right jugular thrombus and bilateral pulmonary emboli. Patient underwent cerebral angiogram with unsuccessful attempts at revascularization due to technical difficulties. MRI brain done revealing patchy areas of acute infarct L-MCA territory and severe punctate foci in R-MCA territory with possible trace SAH in high right frontal lobe. MRA brain with occluded L-ICA and L-MCA.   Carotid dopplers showed sluggish flow right jugular vein and left jugular vein thrombus--subacute and non-occlusive and suggestion of distal Left ICA occlusion. BLE dopplers revealed DVT R-distal CFV, FV, popliteal vein and DVT L-FV and  popliteal vein. 2D echo revealed EF 65-70% with interatrial septal aneurysm. Patient started on IV heparin for treatment on 03/04 and tolerated extubated without difficulty. Protein C, Protein S and lupus anticoagulant negative. Surgery consult recommended for work up once patient stable and question  of thyroid malignancy. Dr. Leonie Man recommends starting oral anticoagulant one week for stroke due r paradoxical embolic. To repeat CCT on 03/10 and if negative for bleed to start Pradaxa. Swallow evaluation with moderate to severe oral phase deficits and patient started on dysphagia 1, honey liquids by tsp. Patient with resultant, right sided weakness, right visual deficits with left gaze preference, severe global aphasia and ideation apraxia.  Patient transferred to CIR on 03/13/2014 .   Patient currently requires total with mobility secondary to muscle weakness, impaired timing and sequencing, unbalanced muscle activation, motor apraxia and decreased motor planning, decreased attention to right, decreased initiation, decreased attention, decreased awareness, decreased problem solving, decreased safety awareness and decreased memory and decreased sitting balance, decreased standing balance, decreased postural control, hemiplegia and decreased balance strategies.  Prior to hospitalization, patient was modified independent  with mobility and lived with Alone in a House home.  Home access is 5 in front with rails on both sides, 3 in back with no railsStairs to enter.  Patient will benefit from skilled PT intervention to maximize safe functional mobility, minimize fall risk and decrease caregiver burden for planned discharge to skilled nursing facility.  Anticipate patient will continued rehabilitation services at skilled nursing facility at discharge.  PT - End of Session Activity Tolerance: Endurance does not limit participation in activity Endurance Deficit: No PT Assessment Rehab Potential (ACUTE/IP ONLY): Good Barriers to Discharge: Decreased caregiver support PT Patient demonstrates impairments in the following area(s): Balance;Skin Integrity;Motor;Perception;Safety;Sensory PT Transfers Functional Problem(s): Bed Mobility;Bed to Chair PT Locomotion Functional Problem(s): Wheelchair Mobility PT  Plan PT Intensity: Minimum of 1-2 x/day ,45 to 90 minutes PT Frequency: 5 out of 7 days PT Duration Estimated Length of Stay: 28 days PT Treatment/Interventions: Balance/vestibular training;Cognitive remediation/compensation;Disease management/prevention;Discharge planning;DME/adaptive equipment instruction;Functional mobility training;Patient/family education;Neuromuscular re-education;Skin care/wound management;Splinting/orthotics;Wheelchair propulsion/positioning;UE/LE Strength taining/ROM;UE/LE Coordination activities;Visual/perceptual remediation/compensation;Therapeutic Exercise;Therapeutic Activities PT Transfers Anticipated Outcome(s): Min to Mod A PT Locomotion Anticipated Outcome(s): Min A at w/c level PT Recommendation Recommendations for Other Services: Other (comment) (None at this time) Follow Up Recommendations: Skilled nursing facility Patient destination: [place] (SNF) Equipment Recommended: To be determined  Skilled Therapeutic Intervention PT evaluation performed. See below for detailed findings. Treatment initiated. Session focused on w/c seated and positioning and trial use of Clarise Cruz Plus to maximize pt independence while promoting safety with transfers with all staff members. After performing partial sit <> stand transfers x3 from recliner with total A using Clarise Cruz Plus, determined this particular mechanical lift to be unsafe transfer method for nursing staff at this time due to inability for pt to achieve sufficient R hip/knee extension as well as unsafe position of R shoulder. Notified NT of recommendation for continued use of Maxi Move; safety plan modified accordingly.  See below for detailed description of assist/cueing required with functional transfers.  Noted Braden Scale score of 12, suggesting high risk of skin breakdown. Therefore, retrieved tilt-in-space w/c to ensure adequate pressure relief.  Educated daughters on findings, goals, and plan of care.  Oriented family members to rehab unit, fall precautions. Educated family on ways to increase pt attention to R of midline. Daughters verbalized understanding of all education and were in full agreement with plan of care. Departed with pt seated  in recliner with all needs within reach. Addendum: Quick release belt in place for safety.  PT Evaluation Precautions/Restrictions Precautions Precautions: Fall Precaution Comments: [name] Restrictions Other Position/Activity Restrictions: R shoulder with sublux General   Vital Signs Pain  0/10 pain, per FACES scale Home Living/Prior Functioning Home Living Available Help at Discharge: Family;Skilled Nursing Facility Type of Home: House Home Access: Stairs to enter [employer] of Steps: 5 in front with rails on both sides, 3 in back with no rails Entrance Stairs-Rails: Right;Left Home Layout: One level Additional Comments: pt with bilateral knee OA, was having trouble getting up and down stairs but pt was independent, had driven herself to beauty parlor day of stroke  Lives With: Alone Prior Function Level of Independence: Independent with basic ADLs;Independent with homemaking with ambulation;Requires assistive device for independence (used standard cane)  Able to Take Stairs?: Yes Driving: Yes Vocation: Retired Leisure: Hobbies-yes (Comment) Comments: Enjoys going to church, shopping, talking on phone with friends. Vision/Perception  Vision - Assessment Additional Comments: Left gaze preference; suspect R neglect  Cognition Overall Cognitive Status: Impaired/Different from baseline Arousal/Alertness: Awake/alert Orientation Level: Oriented to person Attention: Focused Focused Attention: Impaired Focused Attention Impairment: Verbal basic;Functional basic Memory: Impaired Memory Impairment: Storage deficit Awareness: Impaired Awareness Impairment: Intellectual impairment Problem Solving:  Impaired Problem Solving Impairment: Verbal basic;Functional basic Behaviors: Perseveration Safety/Judgment: Impaired Comments: right inattention, perseveration, impulsive with self feeding   Sensation Sensation Light Touch: Impaired Detail Light Touch Impaired Details: Impaired RLE;Impaired RUE Proprioception: Impaired Detail Proprioception Impaired Details: Impaired RUE;Impaired RLE Additional Comments: difficult to assess secondary to cognition; however, pt not responsive to light touch on RUE/LE. Coordination Gross Motor Movements are Fluid and Coordinated: No Fine Motor Movements are Fluid and Coordinated: No Coordination and Movement Description: RUE flaccid with shoulder subluxation and Rt inattention Finger Nose Finger Test: unable to assess due to flaccid RUE Heel Shin Test: Unable to assess secondary to significant weakness in RLE. Motor  Motor Motor: Hemiplegia;Abnormal postural alignment and control;Motor apraxia  Mobility Bed Mobility Bed Mobility: Supine to Sit Supine to Sit: HOB elevated;1: +1 Total assist Transfers Transfers: Yes Sit to Stand: From chair/3-in-1;Without upper extremity assist;1: +2 Total assist Sit to Stand Details: Manual facilitation for weight shifting;Tactile cues for initiation;Tactile cues for posture;Visual cues/gestures for sequencing Stand to Sit: 1: +2 Total assist;Without upper extremity assist;To chair/3-in-1 Stand to Sit Details (indicate cue type and reason): Visual cues/gestures for sequencing;Manual facilitation for weight shifting Locomotion  Ambulation Ambulation: No (Unsafe at this time) Gait Gait: No (Unsafe at this time) Stairs / Additional Locomotion Stairs: No (Unsafe at this time) Product manager Mobility: No (Unable to assess using tilt-in-space w/c)  Trunk/Postural Assessment  Cervical Assessment Cervical Assessment: Exceptions to [facility] (lower cervical flexion; upper cervical extension) Thoracic  Assessment Thoracic Assessment: Exceptions to [facility] (thoracic kyphosis, rotation to L suspected to be secondary to R inattention/neglect) Lumbar Assessment Lumbar Assessment: Exceptions to [facility] (posterior pelvic tilt) Postural Control Postural Control: Deficits on evaluation Head Control: Within 30 seconds of unsupported sitting, pt with increasingly more difficulty maintaining head upright Trunk Control: Pt exhibited tendency toward anterior LOB in static sitting with bilat LE support Postural Limitations: Unable to maintain thoracic spine extension when seated with bilat LE support but without  Balance Balance Balance Assessed: Yes Static Sitting Balance Static Sitting - Balance Support: Left upper extremity supported;Feet supported Static Sitting - Level of Assistance: 5: Stand by assistance;4: Min assist Dynamic Sitting Balance Dynamic Sitting - Balance Support:  Feet supported;Left upper extremity supported;No upper extremity supported;During functional activity Dynamic Sitting - Level of Assistance: 3: Mod assist;2: Max assist Extremity Assessment  RUE Assessment RUE Assessment: Exceptions to [facility] (flaccid, shoulder subluxation, and prior rotator cuff injury.  Pt also with Rt inattention) LUE Assessment LUE Assessment: Within Functional Limits (not formally assessed, but able to complete self-care tasks) RLE Assessment RLE Assessment: Exceptions to [facility] RLE Strength RLE Overall Strength: Due to impaired cognition;Unable to assess;Deficits RLE Overall Strength Comments: Unable to formally assess secondary to aphasia, apraxia, cognitive impairments, and decreased attention to R hemibody.  LLE Assessment LLE Assessment: Exceptions to [facility] LLE Strength LLE Overall Strength: Deficits LLE Overall Strength Comments: Difficult to formally assess secondary to aphasia, cognitive impairments. Based on observation of functional movement, grossly 3/5 to 4-/5 at L hip/knee; 3-/5 at L ankle  FIM:   FIM - Bed/Chair Transfer Bed/Chair Transfer: 1: Two helpers FIM - Locomotion: Wheelchair Locomotion: Wheelchair: 0: Activity did not occur FIM - Locomotion: Ambulation Locomotion: Ambulation: 0: Activity did not occur (Unsafe at this time) FIM - Locomotion: Stairs Locomotion: Stairs: 0: Activity did not occur (Unsafe at this time)   Refer to Care Plan for Long Term Goals  Recommendations for other services: None  Discharge Criteria: Patient will be discharged from PT if patient refuses treatment 3 consecutive times without medical reason, if treatment goals not met, if there is a change in medical status, if patient makes no progress towards goals or if patient is discharged from hospital.  The above assessment, treatment plan, treatment alternatives and goals were discussed and mutually agreed upon: by family  Stefano Gaul 03/14/2014, 10:28 PM

## 2014-03-14 NOTE — Progress Notes (Signed)
INITIAL NUTRITION ASSESSMENT  DOCUMENTATION CODES Per approved criteria  -Not Applicable   INTERVENTION: Provide Ensure Pudding po TID, each supplement provides 170 kcal and 4 grams of protein.  Provide Magic cup TID with meals, each supplement provides 290 kcal and 9 grams of protein.  Encourage adequate PO intake.  NUTRITION DIAGNOSIS: Inadequate oral intake related to dislike of puree diet as evidenced by meal completion of 20-35%.   Goal: Pt to meet >/= 90% of their estimated nutrition needs   Monitor:  PO intake, weight trends, labs, I/O's  Reason for Assessment: Low Braden Score  81 y.o. female  Admitting Dx: Cerebral infarction due to embolism of left middle cerebral artery  ASSESSMENT: Pt history of HTN, OA, Migraines, hyperlipidemia who was admitted on 03/08/14 with right sided weakness, difficulty talking and right facial droop. MRI brain done revealing patchy areas of acute infarct L-MCA territory and severe punctate foci in R-MCA territory with possible trace SAH in high right frontal lobe. MRA brain with occluded L-ICA and L-MCA.   Daughter present at pt bedside. Pt aphasic. Daughter reports pt has been mostly eating a quarter of her meals as she does not like her puree diet. She reports PTA she was eating with with no other difficulties. Weight has been stable. Pt currently has Ensure Pudding ordered. Daughter and pt are agreeable on trying them. Will continue with current orders. RD to additionally order Magic cup as pt likes ice cream. Pt was encouraged to eat her food at meals and to consume her supplements.  Pt with no observed significant fat or muscle mass loss.  Labs: Low creatinine and calcium. High chloride and total bilirubin.  Height: Ht Readings from Last 1 Encounters:  03/08/14 5\' 5"  (1.651 m)    Weight: Wt Readings from Last 1 Encounters:  03/12/14 175 lb 7.8 oz (79.6 kg)    Ideal Body Weight: 125 lbs  % Ideal Body Weight: 140 lbs  Wt  Readings from Last 10 Encounters:  03/12/14 175 lb 7.8 oz (79.6 kg)  02/21/14 163 lb (73.936 kg)  10/03/13 167 lb (75.751 kg)  08/29/13 168 lb (76.204 kg)  08/07/13 171 lb (77.565 kg)  08/01/13 171 lb (77.565 kg)  07/04/13 170 lb (77.111 kg)  06/27/13 168 lb (76.204 kg)  06/20/13 170 lb (77.111 kg)  05/30/13 169 lb (76.658 kg)    Usual Body Weight: 170 lbs  % Usual Body Weight: 103%  BMI:  Body Mass Index: 29.26 kg/(m^2)  Estimated Nutritional Needs: Kcal: 1800-2000 Protein: 90-100 grams Fluid: 1.8 - 2 L/day  Skin: Incision on right groin, non-pitting LUE, RLE, +1 RUE edema  Diet Order: DIET - DYS 1 with honey thickened liquids  EDUCATION NEEDS: -No education needs identified at this time   Intake/Output Summary (Last 24 hours) at 03/14/14 1011 Last data filed at 03/14/14 0900  Gross per 24 hour  Intake  280.5 ml  Output      0 ml  Net  280.5 ml    Last BM: 3/8  Labs:   Recent Labs Lab 03/09/14 0450 03/10/14 0219 03/11/14 0301 03/14/14 0423  NA 143  142 144 143 142  K 3.1*  3.0* 2.8* 3.1* 3.5  CL 108  109 110 113* 113*  CO2 25  26 26 22 26   BUN 8  7 7 8 6   CREATININE 0.55  0.53 0.57 0.43* 0.46*  CALCIUM 8.3*  8.2* 8.1* 8.5 8.3*  MG 1.8 1.9  --   --  PHOS 4.2  4.2  --   --   --   GLUCOSE 134*  137* 118* 108* 101*    CBG (last 3)   Recent Labs  03/13/14 1621 03/13/14 2111 03/14/14 0648  GLUCAP 96 108* 116*    Scheduled Meds: . sodium chloride   Intravenous Daily  . antiseptic oral rinse  7 mL Mouth Rinse BID  . diclofenac sodium  2 g Topical QID  . feeding supplement (ENSURE)  1 Container Oral TID BM  . pantoprazole sodium  40 mg Per Tube Q24H    Continuous Infusions: . heparin 1,350 Units/hr (03/14/14 3086)    Past Medical History  Diagnosis Date  . HYPERTENSION   . HYPERTHYROIDISM   . OBESITY   . ARTHRITIS   . Posttraumatic stress disorder   . INSOMNIA   . MIGRAINE HEADACHE   . Hyperthyroidism     s/p I-131  ablation 03/2011 of multinod goiter  . Arthritis     Past Surgical History  Procedure Laterality Date  . Abdominal hysterectomy  1976  . Total hip arthroplasty  1998     right  . Tonsillectomy    . Breast surgery      biopsy  . Radiology with anesthesia Left 03/08/2014    Procedure: RADIOLOGY WITH ANESTHESIA;  Surgeon: Rob Hickman, MD;  Location: [place];  Service: Radiology;  Laterality: Left;    Kallie Locks, MS, RD, LDN Pager # [phone] After hours/ weekend pager # [phone]

## 2014-03-14 NOTE — Progress Notes (Signed)
BLE dopplers revealed DVT R-distal CFV, FV, popliteal vein and DVT L-FV and popliteal vein.  2D echo revealed EF 65-70% with interatrial septal aneurysm.  Patient started on IV heparin for treatment on 03/04 and tolerated extubated without difficulty. Protein C, Protein S and lupus anticoagulant negative. Surgery consult recommended for work up once patient stable and question of thyroid malignancy. Dr. Leonie Man recommends starting oral anticoagulant one week for stroke due to paradoxical embolic. To repeat CCT on 03/10 and if negative for bleed to start  Pradaxa  Subjective/Complaints: Pt severely aphasic. occ groans but no intelligible speech Per daughter slept ok from 2a to this morning ROS: cannot obtain due to aphasia Objective: Vital Signs: Blood pressure 122/50, pulse 91, temperature 98.9 F (37.2 C), temperature source Oral, resp. rate 18, SpO2 90 %. No results found. Results for orders placed or performed during the hospital encounter of 03/13/14 (from the past 72 hour(s))  Glucose, capillary     Status: None   Collection Time: 03/13/14  4:21 PM  Result Value Ref Range   Glucose-Capillary 96 70 - 99 mg/dL  Glucose, capillary     Status: Abnormal   Collection Time: 03/13/14  9:11 PM  Result Value Ref Range   Glucose-Capillary 108 (H) 70 - 99 mg/dL  CBC WITH DIFFERENTIAL     Status: Abnormal   Collection Time: 03/14/14  4:23 AM  Result Value Ref Range   WBC 8.6 4.0 - 10.5 K/uL   RBC 2.95 (L) 3.87 - 5.11 MIL/uL   Hemoglobin 8.8 (L) 12.0 - 15.0 g/dL   HCT 27.8 (L) 36.0 - 46.0 %   MCV 94.2 78.0 - 100.0 fL   MCH 29.8 26.0 - 34.0 pg   MCHC 31.7 30.0 - 36.0 g/dL   RDW 17.6 (H) 11.5 - 15.5 %   Platelets 509 (H) 150 - 400 K/uL   Neutrophils Relative % 78 (H) 43 - 77 %   Neutro Abs 6.7 1.7 - 7.7 K/uL   Lymphocytes Relative 11 (L) 12 - 46 %   Lymphs Abs 0.9 0.7 - 4.0 K/uL   Monocytes Relative 8 3 - 12 %   Monocytes Absolute 0.7 0.1 - 1.0 K/uL   Eosinophils Relative 2 0 - 5 %   Eosinophils Absolute 0.2 0.0 - 0.7 K/uL   Basophils Relative 1 0 - 1 %   Basophils Absolute 0.1 0.0 - 0.1 K/uL  Comprehensive metabolic panel     Status: Abnormal   Collection Time: 03/14/14  4:23 AM  Result Value Ref Range   Sodium 142 135 - 145 mmol/L   Potassium 3.5 3.5 - 5.1 mmol/L   Chloride 113 (H) 96 - 112 mmol/L   CO2 26 19 - 32 mmol/L   Glucose, Bld 101 (H) 70 - 99 mg/dL   BUN 6 6 - 23 mg/dL   Creatinine, Ser 0.46 (L) 0.50 - 1.10 mg/dL   Calcium 8.3 (L) 8.4 - 10.5 mg/dL   Total Protein 4.6 (L) 6.0 - 8.3 g/dL   Albumin 2.4 (L) 3.5 - 5.2 g/dL   AST 17 0 - 37 U/L   ALT 10 0 - 35 U/L   Alkaline Phosphatase 48 39 - 117 U/L   Total Bilirubin 1.3 (H) 0.3 - 1.2 mg/dL   GFR calc non Af Amer >90 >90 mL/min   GFR calc Af Amer >90 >90 mL/min    Comment: (NOTE) The eGFR has been calculated using the CKD EPI equation. This calculation has not been validated in  all clinical situations. eGFR's persistently <90 mL/min signify possible Chronic Kidney Disease.    Anion gap 3 (L) 5 - 15  Heparin level (unfractionated)     Status: Abnormal   Collection Time: 03/14/14  4:23 AM  Result Value Ref Range   Heparin Unfractionated 0.27 (L) 0.30 - 0.70 IU/mL    Comment:        IF HEPARIN RESULTS ARE BELOW EXPECTED VALUES, AND PATIENT DOSAGE HAS BEEN CONFIRMED, SUGGEST FOLLOW UP TESTING OF ANTITHROMBIN III LEVELS.   Glucose, capillary     Status: Abnormal   Collection Time: 03/14/14  6:48 AM  Result Value Ref Range   Glucose-Capillary 116 (H) 70 - 99 mg/dL     HEENT: tongue midline, no obvous neck swelling Cardio: RRR and no murmur Resp: CTA B/L and unlabored GI: BS positive and NT< ND Extremity:  Edema LUE> LLE Skin:   Bruise multiple ecchymotic areas BUE Neuro: Lethargic, Cranial Nerve Abnormalities RIght central 7, Abnormal Sensory cannot assess due to aphasia, Abnormal Motor 0/5 RUE, 2- RLE, antigravity on Left but has receptive difficulties and apraxia limiting MMT, Dysarthric,  Aphasic and Apraxic Musc/Skel:  Other No lknee pain or effusion noted tolerates palpation Gen NAD, IV hep reunning   Assessment/Plan: 1. Functional deficits secondary to Large left MCA infarct embolic which require 3+ hours per day of interdisciplinary therapy in a comprehensive inpatient rehab setting. Physiatrist is providing close team supervision and 24 hour management of active medical problems listed below. Physiatrist and rehab team continue to assess barriers to discharge/monitor patient progress toward functional and medical goals. FIM: FIM - Bathing Bathing: 1: Two helpers  FIM - Upper Body Dressing/Undressing Upper body dressing/undressing: 1: Total-Patient completed less than 25% of tasks FIM - Lower Body Dressing/Undressing Lower body dressing/undressing: 1: Two helpers  FIM - Toileting Toileting: 0: Activity did not occur     FIM - Control and instrumentation engineer Devices: Bed rails, HOB elevated Bed/Chair Transfer: 1: Supine > Sit: Total A (helper does all/Pt. < 25%), 1: Bed > Chair or W/C: Total A (helper does all/Pt. < 25%), 1: Two helpers     Comprehension Comprehension Mode: Auditory Comprehension:  (UTA due to aphasia )  Expression Expression Mode: Nonverbal (UTA due to aphasia ) Expression Assistive Devices:  (head nods ) Expression: 1-Expresses basis less than 25% of the time/requires cueing greater than 75% of the time.  Social Interaction Social Interaction: 1-Interacts appropriately less than 25% of the time. May be withdrawn or combative.  Problem Solving Problem Solving: 1-Solves basic less than 25% of the time - needs direction nearly all the time or does not effectively solve problems and may need a restraint for safety  Memory Memory Mode:  (UTA due to aphasia )  Medical Problem List and Plan: 1. Functional deficits secondary to Large L-MCA infarct with dense right hemiplegia, dysphagia and global aphasia.   2.  DVT  Prophylaxis/Anticoagulation: Pharmaceutical: Heparin--oral agent to start on 3/11 3. Pain Management: tramadol, tylenol, appropriate turning in bed, regular checks for comfort 4. Mood: LCSW to follow for evaluation and support.   5. Neuropsych: This patient is not capable of making decisions on her own behalf. 6. Skin/Wound Care:  Turn patient every 4 hours.  Rehab RN to monitor skin daily.  Maintain adequate nutrition and hydration status.   7. Fluids/Electrolytes/Nutrition:  Monitor I/O. IVF at nights to help maintain adequate hydration.   -will need a lot of encouragement and supervision to meet caloric needs. 8.  Hypertension: normotensive at present 9.  Thyroid mass question carcinoma, further eval as pt recovers from severe CVA 10.  Hx bilateral knee OA, saw ortho as outpt, diclofenac gel LOS (Days) 1 A FACE TO [place] E 03/14/2014, 9:11 AM

## 2014-03-14 NOTE — Evaluation (Signed)
Occupational Therapy Assessment and Plan  Patient Details  Name: Sandy Barrett MRN: [id number] Date of Birth: 06/05/1932  OT Diagnosis: apraxia, cognitive deficits, disturbance of vision, flaccid hemiplegia and hemiparesis, hemiplegia affecting dominant side and muscle weakness (generalized) Rehab Potential: Rehab Potential (ACUTE ONLY): Good ELOS: 24-28 days   Today's Date: 03/14/2014 OT Individual Time:  0730- 0835     65 mins  Problem List:  Patient Active Problem List   Diagnosis Date Noted  . Acute pulmonary embolism 03/14/2014  . DVT of lower extremity, bilateral 03/14/2014  . Global aphasia 03/14/2014  . 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 Dysphagia due to recent stroke 03/14/2014  . Aphasia due to stroke 03/13/2014  . Right hemiparesis 03/13/2014  . Embolic stroke   . Cerebral infarction due to embolism of left middle cerebral artery   . Cerebral infarction due to unspecified mechanism   . Respiratory failure   . Cerebral infarction due to thrombosis of right middle cerebral artery   . Stroke 03/08/2014  . Pulmonary embolism 03/08/2014  . CVA (cerebral infarction)   . Right sided weakness   . Back pain 08/29/2013  . Primary localized osteoarthrosis, lower leg 03/06/2013  . IBS (irritable bowel syndrome)   . 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 Multinodular goiter 01/13/2011  . Abnormal chest x-ray 12/12/2010  . HYPERTHYROIDISM 11/11/2009  . INSOMNIA 07/03/2009  . POSTTRAUMATIC STRESS DISORDER 08/22/2008  . OBESITY 04/21/2008  . OSTEOARTHRITIS 04/21/2008  . MIGRAINE HEADACHE 04/20/2008  . Essential hypertension 04/20/2008    Past Medical History:  Past Medical History  Diagnosis Date  . HYPERTENSION   . HYPERTHYROIDISM   . OBESITY   . ARTHRITIS   . Posttraumatic stress disorder   . INSOMNIA   . MIGRAINE HEADACHE   . Hyperthyroidism     s/p I-131 ablation 03/2011 of multinod goiter  . Arthritis    Past Surgical History:  Past Surgical History   Procedure Laterality Date  . Abdominal hysterectomy  1976  . Total hip arthroplasty  1998     right  . Tonsillectomy    . Breast surgery      biopsy  . Radiology with anesthesia Left 03/08/2014    Procedure: RADIOLOGY WITH ANESTHESIA;  Surgeon: Sanjeev K Deveshwar, MD;  Location: [facility] OR;  Service: Radiology;  Laterality: Left;    Assessment & Plan Clinical Impression: Patient is a 81 y.o. year old female with history of HTN, OA, Migraines, hyperlipidemia who was admitted on 03/08/14 with right sided weakness, difficulty talking and right facial droop. Patient had a hair appointment that day at 1300 hours and the hair dresser immediately noted she was confused and not talking correctly. CT head showed dense left MCA sign and Perfusion study showed a large area of penumbra involving the left MCA territory. Patient last seen normal at noon that day and tPA administered past CT chest negative for dissection. However incidental note of large thyroid mass displacing the trachea to the right, left internal jugular thormbus likely due to extrinsic compression by goiter, question right jugular thrombus and bilateral pulmonary emboli. Patient underwent cerebral angiogram with unsuccessful attempts at revascularization due to technical difficulties. MRI brain done revealing patchy areas of acute infarct L-MCA territory and severe punctate foci in R-MCA territory with possible trace SAH in high right frontal lobe. MRA brain with occluded L-ICA and L-MCA.   Carotid dopplers showed sluggish flow right jugular vein and left jugular vein thrombus--subacute and non-occlusive and suggestion of distal Left ICA   occlusion. BLE dopplers revealed DVT R-distal CFV, FV, popliteal vein and DVT L-FV and popliteal vein. 2D echo revealed EF 65-70% with interatrial septal aneurysm. Patient started on IV heparin for treatment on 03/04 and tolerated extubated without difficulty. Protein C, Protein S and lupus anticoagulant  negative. Surgery consult recommended for work up once patient stable and question of thyroid malignancy. Dr. Leonie Man recommends starting oral anticoagulant one week for stroke due r paradoxical embolic. To repeat CCT on 03/10 and if negative for bleed to start Pradaxa. Swallow evaluation with moderate to severe oral phase deficits and patient started on dysphagia 1, honey liquids by tsp. Patient with resultant, right sided weakness, right visual deficits with left gaze preference, severe global aphasia and ideation apraxia. CIR recommended by MD and Rehab team. Patient has been cleared medically today and is admitted to CIR to help decrease burden of care.    Patient transferred to CIR on 03/13/2014 .    Patient currently requires total with basic self-care skills secondary to muscle weakness, impaired timing and sequencing, abnormal tone, unbalanced muscle activation, motor apraxia and decreased motor planning, decreased visual perceptual skills, decreased attention to right, right side neglect and ideational apraxia, decreased initiation, decreased attention, decreased problem solving, decreased safety awareness and delayed processing and decreased sitting balance, decreased standing balance, hemiplegia and decreased balance strategies.  Prior to hospitalization, patient could complete ADLs with independent .  Patient will benefit from skilled intervention to decrease level of assist with basic self-care skills prior to discharge skilled nursing facility.  Anticipate patient will require 24 hour supervision and moderate physical assestance and most likely skilled nursing facility as both daughter's work.  OT - End of Session Activity Tolerance: Tolerates 30+ min activity without fatigue Endurance Deficit: No OT Assessment Rehab Potential (ACUTE ONLY): Good Barriers to Discharge: Decreased caregiver support OT Patient demonstrates impairments in the following area(s):  Balance;Cognition;Edema;Endurance;Motor;Perception;Safety;Sensory;Vision OT Basic ADL's Functional Problem(s): Eating;Grooming;Bathing;Dressing;Toileting OT Transfers Functional Problem(s): Toilet;Tub/Shower OT Additional Impairment(s): Fuctional Use of Upper Extremity OT Plan OT Intensity: Minimum of 1-2 x/day, 45 to 90 minutes OT Frequency: 5 out of 7 days OT Duration/Estimated Length of Stay: 24-28 days OT Treatment/Interventions: Balance/vestibular training;Cognitive remediation/compensation;Discharge planning;Disease mangement/prevention;DME/adaptive equipment instruction;Functional mobility training;Neuromuscular re-education;Pain management;Patient/family education;Psychosocial support;Self Care/advanced ADL retraining;Skin care/wound managment;Splinting/orthotics;Therapeutic Activities;Therapeutic Exercise;UE/LE Strength taining/ROM;UE/LE Coordination activities;Visual/perceptual remediation/compensation OT Self Feeding Anticipated Outcome(s): min assist OT Basic Self-Care Anticipated Outcome(s): mod assist OT Toileting Anticipated Outcome(s): mod assist OT Bathroom Transfers Anticipated Outcome(s): mod assist OT Recommendation Patient destination: [place] (SNF) Follow Up Recommendations: Skilled nursing facility Equipment Recommended: 3 in 1 bedside comode;Tub/shower bench   Skilled Therapeutic Intervention OT eval with discussion with family OT purpose, plan and goals.  ADL assessment completed seated at EOB with total assist for bed mobility.  In unsupported sitting pt with supervision for static sitting progressing to min-mod assist with fatigue and when attempting to engage in task.  Pt required hand over hand assist for initiation with washing face, followed by perseveration of washing face when asked to wash another part of the body.  Pt with Lt gaze preference and possible Rt neglect with decreased scanning to Rt and decreased attention to RUE.  Sit > stand with  total assist with pt with slight initiation.  Required +2 for clothing management in standing and with transfer due to decreased initiation, sequencing, ideational apraxia, and overall impairments.  OT Evaluation Precautions/Restrictions  Precautions Precautions: Fall Precaution Comments: [name] Restrictions Other Position/Activity Restrictions: R shoulder with  sublux General   Vital Signs Therapy Vitals Temp: 99 F (37.2 C) Temp Source: Oral Pulse Rate: 92 Resp: 16 BP: (!) 102/57 mmHg Patient Position (if appropriate): Sitting Oxygen Therapy SpO2: 95 % Pain Pain Assessment Pain Assessment: Faces Faces Pain Scale: No hurt Home Living/Prior Functioning Home Living Available Help at Discharge: Family, Skilled Nursing Facility Type of Home: House Home Access: Stairs to enter Entrance Stairs-Number of Steps: 5 in front with rails on both sides, 3 in back with no rails Entrance Stairs-Rails: Right, Left Home Layout: One level Additional Comments: pt with bilateral knee OA, was having trouble getting up and down stairs but pt was independent, had driven herself to beauty parlor day of stroke  Lives With: Alone IADL History Homemaking Responsibilities: Yes Meal Prep Responsibility: Primary Laundry Responsibility: Primary Cleaning Responsibility: Primary Bill Paying/Finance Responsibility: Primary Shopping Responsibility: Primary Current License: Yes Prior Function Level of Independence: Independent with basic ADLs, Independent with homemaking with ambulation, Independent with gait Driving: Yes ADL  See FIM Vision/Perception  Vision- Assessment Vision Assessment?: Vision impaired- to be further tested in functional context Additional Comments: Pt with Lt gaze preference, suspect Rt neglect  Cognition Overall Cognitive Status: Impaired/Different from baseline Arousal/Alertness: Awake/alert Orientation Level: Oriented to person Attention:  Focused Focused Attention: Impaired Focused Attention Impairment: Verbal basic;Functional basic Memory: Impaired Memory Impairment: Storage deficit Awareness: Impaired Awareness Impairment: Intellectual impairment Problem Solving: Impaired Problem Solving Impairment: Verbal basic;Functional basic Behaviors: Perseveration Safety/Judgment: Impaired Comments: right inattention, perseveration, impulsive with self feeding   Sensation Sensation Light Touch: Impaired by gross assessment Proprioception: Impaired Detail Proprioception Impaired Details: Impaired RUE Additional Comments: difficult to assess secondary to cognition with pt shaking head yes to all questions Coordination Gross Motor Movements are Fluid and Coordinated: No Fine Motor Movements are Fluid and Coordinated: No Coordination and Movement Description: RUE flaccid with shoulder subluxation and Rt inattention Finger Nose Finger Test: unable to assess due to flaccid RUE Extremity/Trunk Assessment RUE Assessment RUE Assessment: Exceptions to WFL (flaccid, shoulder subluxation, and prior rotator cuff injury.  Pt also with Rt inattention) LUE Assessment LUE Assessment: Within Functional Limits (not formally assessed, but able to complete self-care tasks)  FIM:  FIM - Grooming Grooming Steps: Wash, rinse, dry face Grooming: 2: Patient completes 1 of 4 or 2 of 5 steps FIM - Bathing Bathing: 1: Two helpers FIM - Upper Body Dressing/Undressing Upper body dressing/undressing: 1: Total-Patient completed less than 25% of tasks FIM - Lower Body Dressing/Undressing Lower body dressing/undressing: 1: Two helpers FIM - Bed/Chair Transfer Bed/Chair Transfer Assistive Devices: Bed rails;HOB elevated Bed/Chair Transfer: 1: Supine > Sit: Total A (helper does all/Pt. < 25%);1: Bed > Chair or W/C: Total A (helper does all/Pt. < 25%);1: Two helpers   Refer to Care Plan for Long Term Goals  Recommendations for other services:  None  Discharge Criteria: Patient will be discharged from OT if patient refuses treatment 3 consecutive times without medical reason, if treatment goals not met, if there is a change in medical status, if patient makes no progress towards goals or if patient is discharged from hospital.  The above assessment, treatment plan, treatment alternatives and goals were discussed and mutually agreed upon: by patient and by family  HOXIE, SARAH 03/14/2014, 3:24 PM  

## 2014-03-14 NOTE — Progress Notes (Signed)
[facility] Admission Coordinator Signed Physical Medicine and Rehabilitation PMR Pre-admission 03/13/2014 11:37 AM  Related encounter: ED to Hosp-Admission (Discharged) from 03/08/2014 in [place]   PMR Admission Coordinator Pre-Admission Assessment  Patient: Sandy Barrett is an 81 y.o., female MRN: [id number] DOB: 12/21/1932 Height: 5\' 5"  (165.1 cm) Weight: 79.6 kg (175 lb 7.8 oz)  Insurance Information  PRIMARY: Medicare A & B Policy#: [id number] Subscriber: self Pre-Cert#: verified in [employer]: retired Runner, broadcasting/film/video. Date: A & B: 08-06-99 Deduct: $1288 Out of Pocket Max: none Life Max: unlimited CIR: 100% SNF: 100% days 1-20; 80% days 21-100 (100 day visit max) Outpatient: 80% Co-Pay: 20% Home Health: 100% Co-Pay: none DME: 80% Co-Pay: 20% Providers: pt's preference  SECONDARY: BCBS of [place] Policy#: [id number] Subscriber: self Benefits: Phone #: [phone]   Emergency Contact Information Contact Information    Name Relation Home Work [place] Daughter [phone]  [phone]   Gracia,Camilla Daughter   [phone]     Current Medical History  Patient Admitting Diagnosis: large left MCA territory infarct with dense right hemiparesis and aphasia  History of Present Illness: Sandy Barrett is a 81 y.o. female with history of HTN, OA, hyperlipidemia who was admitted on 03/08/14 with right sided weakness, difficulty talking and right facial droop. Patient had a hair appointment that day at 1300 hours and the hair dresser immediately noted she was confused and not talking correctly. CT head showed dense left MCA sign and Perfusion study showed a large area of  penumbra involving the left MCA territory. Patient last seen normal at noon that day and tPA administered past CT chest negative for dissection. However incidental note of large thyroid mass as well as bilateral pulmonary emboli. Patient underwent cerebral angiogram with unsuccessful attempts at revascularization due to technical difficulties. Carotid dopplers with sluggish flow right jugular vein and left jegular vein thrombus--subacute and non-occlusive. BLE dopplers with DVT R-distal CFV, FV, popliteal vein and DVT L-FV and popliteal vein. Patient started on IV heparin for treatment on 03/04 and tolerated extubated without difficulty. Swallow evaluation with moderate to severe oral phase deficits and NPO recommended. Patient with resultant, right sided weakness, right visual deficits with left gaze preference and severe global aphasia with apraxia. CIR recommended by MD and Rehab team.  NIH Total: 25  Past Medical History  Past Medical History  Diagnosis Date  . HYPERTENSION   . HYPERTHYROIDISM   . OBESITY   . ARTHRITIS   . Posttraumatic stress disorder   . INSOMNIA   . MIGRAINE HEADACHE   . Hyperthyroidism     s/p I-131 ablation 03/2011 of multinod goiter  . Arthritis     Family History  family history includes Asthma in her father and mother; Prostate cancer in her father.  Prior Rehab/Hospitalizations: pt had outpt PT for bilateral knee issues.  Current Medications   Current facility-administered medications:  . 0.9 % sodium chloride infusion, , Intravenous, Continuous, David L Rinehuls, PA-C, Last Rate: 75 mL/hr at 03/13/14 0355 . acetaminophen (TYLENOL) tablet 650 mg, 650 mg, Oral, Q4H PRN **OR** acetaminophen (TYLENOL) suppository 650 mg, 650 mg, Rectal, Q4H PRN, Marliss Coots, PA-C . antiseptic oral rinse (CPC / CETYLPYRIDINIUM CHLORIDE 0.05%) solution 7 mL, 7 mL, Mouth Rinse, BID, Rosalin Hawking, MD, 7 mL at 03/13/14 0800 . heparin ADULT  infusion 100 units/mL (25000 units/250 mL), 1,350 Units/hr, Intravenous, Continuous, Garvin Fila, MD, Last Rate: 13.5 mL/hr  at 03/13/14 1110, 1,350 Units/hr at 03/13/14 1110 . labetalol (NORMODYNE,TRANDATE) injection 10 mg, 10 mg, Intravenous, Q10 min PRN, Marliss Coots, PA-C . pantoprazole sodium (PROTONIX) 40 mg/20 mL oral suspension 40 mg, 40 mg, Per Tube, Q24H, Chesley Mires, MD, 40 mg at 03/13/14 0950 . RESOURCE THICKENUP CLEAR, , Oral, PRN, Rosalin Hawking, MD . senna-docusate (Senokot-S) tablet 1 tablet, 1 tablet, Oral, QHS PRN, Marliss Coots, PA-C  Patients Current Diet: DIET - DYS 1, honey thick, spoon bites/sips only (note: pt has partial dentures as well)  Precautions / Restrictions Precautions Precautions: Fall Restrictions Weight Bearing Restrictions: No Other Position/Activity Restrictions: R shoulder with sublux   Prior Activity Level Limited Community (1-2x/wk): Pt got out about every other day and goes to the beauty shop every 2 weeks. She did her own errands. She was limited by arthritis in both knees, her L knee worse than R. Per PT note, pt with bilateral knee OA, was having trouble getting up and down stairs but pt was independent, had driven herself to beauty parlor day of stroke    Home Assistive Devices / [place] Devices/Equipment: Cane (specify quad or straight) (straight) Home Equipment: Cane - single point  Prior Functional Level Prior Function Level of Independence: Independent with assistive device(s) Comments: Driving  Current Functional Level Cognition  Arousal/Alertness: Awake/alert Overall Cognitive Status: Impaired/Different from baseline Difficult to assess due to: Impaired communication Current Attention Level: Sustained Orientation Level: Oriented to person Following Commands: Follows one step commands inconsistently General Comments: pt with both expressive and receptive aphasia Attention: Focused Focused Attention:  Impaired Focused Attention Impairment: Verbal basic, Functional basic Memory: Impaired Memory Impairment: Storage deficit Awareness: Impaired Awareness Impairment: Intellectual impairment Problem Solving: Impaired Problem Solving Impairment: Verbal basic, Functional basic Safety/Judgment: Impaired Comments: requiring use of mittens   Extremity Assessment (includes Sensation/Coordination)  Upper Extremity Assessment: Defer to OT evaluation RUE Deficits / Details: brunstum 0; PROM WNL except tightness noted for forearm supination; 1 1/2 finger sublux RUE Sensation: decreased light touch RUE Coordination: decreased fine motor, decreased gross motor  Lower Extremity Assessment: RLE deficits/detail, LLE deficits/detail, Difficult to assess due to impaired cognition RLE Deficits / Details: no active motion RLE on command however, in standing, pt did actively extend right hip through partial range for transfer RLE Sensation: decreased light touch, decreased proprioception RLE Coordination: decreased fine motor, decreased gross motor LLE Deficits / Details: difficult to assess due to cognition but when pt's left knee extended, she was able to hold it in full extension, no buckling of left knee in standing and able to pivot left foot in standing    ADLs  Overall ADL's : Needs assistance/impaired Eating/Feeding: NPO Grooming: Brushing hair Grooming Details (indicate cue type and reason): maximal A hand over hand supine in bed. Pt eventually just looked at me and shook her head no (which is the first Ive seen--usually shakes yes) Upper Body Bathing: Maximal assistance, Sitting Lower Body Bathing: Maximal assistance (with +2 Mod A sit<>stand) Upper Body Dressing : Total assistance, Sitting Lower Body Dressing: Total assistance (with +2 Mod A sit<>stand) Toilet Transfer: Moderate assistance, +2 for physical assistance, Stand-pivot (Bed>recliner going to pt's left) Toileting- Clothing  Manipulation and Hygiene: Total assistance (with +2 Mod A sit<>stand) General ADL Comments: Reinforced positioning of RUE with daughters in room to help support arm due to sublux and for edema control. Also educated them on retrograde massage.    Mobility  Overal bed mobility: Needs Assistance Bed Mobility: Sit to  Supine Supine to sit: Max assist Sit to supine: Max assist, +2 for physical assistance General bed mobility comments: manage LEs and trunk    Transfers  Overall transfer level: Needs assistance Equipment used: (2 person lift with gait belt and pad) Transfers: Sit to/from Stand Sit to Stand: Max assist, +2 physical assistance Stand pivot transfers: +2 physical assistance, Mod assist General transfer comment: pt pulled self forward with L UE on chair. pt able to extend and pvt on L LE but R LE went into extensor tone and pt was unable to advance requriing maxA to manage R LE. completed stand x2     Ambulation / Gait / Stairs / Wheelchair Mobility  Ambulation/Gait General Gait Details: unable    Posture / Balance Dynamic Sitting Balance Sitting balance - Comments: Did lose balance posteriorly x2  Balance Overall balance assessment: Needs assistance Sitting-balance support: Feet supported Sitting balance-Leahy Scale: Poor Sitting balance - Comments: Did lose balance posteriorly x2  Standing balance support: Bilateral upper extremity supported, During functional activity Standing balance-Leahy Scale: Zero Standing balance comment: unable to support right side body on her own    Special needs/care consideration BiPAP/CPAP no  CPM no  Continuous Drip IV no  Dialysis no  Life Vest no Oxygen no  Special Bed no  Trach Size no  Wound Vac (area) no  Skin - dtr reports several bruises on large bruise on L UE  Bowel mgmt: last BM on 3-7, + incontinence Bladder mgmt: urinary incontinence Diabetic mgmt  no  Pt's dtrs are requesting family education on how to assist pt with her partial dentures.   Previous Home Environment Living Arrangements: Alone Type of Home: House Home Layout: One level Home Access: Stairs to enter Entrance Stairs-Rails: Can reach both Entrance Stairs-Number of Steps: 3 Home Care Services: No Additional Comments: pt with bilateral knee OA, was having trouble getting up and down stairs but pt was independent, had driven herself to beauty parlor day of stroke  Discharge Living Setting Plans for Discharge Living Setting: (unsure of at this time, likely SNF) Does the patient have any problems obtaining your medications?: No (family can provide needed assist with this.)  Social/Family/Support Systems Patient Roles: Other (Comment) (involved in her family and friends) Contact Information: dts are primary contact Anticipated Caregiver: Two dtrs work, other family members are supportive and they are not sure of DC plan. Considering SNF. Anticipated Caregiver's Contact Information: see above Ability/Limitations of Caregiver: Both dtrs work and they are considering SNF at the end of rehab. Caregiver Availability: Intermittent Discharge Plan Discussed with Primary Caregiver: Yes Is Caregiver In Agreement with Plan?: Yes Does Caregiver/Family have Issues with Lodging/Transportation while Pt is in Rehab?: No  Goals/Additional Needs Patient/Family Goal for Rehab: Min to Mod assist with PT, OT and SLP Expected length of stay: 20-28 days Cultural Considerations: Christian Dietary Needs: Dys 1, honey thick, spoon bites/sips only (note patient has partial dentures) Equipment Needs: to be determined Pt/Family Agrees to Admission and willing to participate: Yes (spoke with pt and her two dtrs ) Program Orientation Provided & Reviewed with Pt/Caregiver Including Roles & Responsibilities: Yes   Decrease burden of Care through IP rehab admission: NA   Possible need for SNF  placement upon discharge: yes, likely as both dtrs work and they are not sure of other available family support.  Patient Condition: This patient's condition remains as documented in the consult dated 03-12-14, in which the Rehabilitation Physician determined and documented that the patient's condition is  appropriate for intensive rehabilitative care in an inpatient rehabilitation facility. Will admit to inpatient rehab today.  Preadmission Screen Completed By: Nanetta Batty, PT, 03/13/2014 11:45 AM ______________________________________________________________________  Discussed status with Dr. Naaman Plummer on 03-13-14 at 1137 and received telephone approval for admission today.  Admission Coordinator: Nanetta Batty, PT, time1137/Date 03-13-14          Cosigned by: Meredith Staggers, MD at 03/13/2014 1:10 PM  Revision History     Date/Time User Provider Type Action   03/13/2014 1:10 PM Meredith Staggers, MD Physician Cosign   03/13/2014 11:46 AM [facility] Admission Coordinator Sign

## 2014-03-15 ENCOUNTER — Ambulatory Visit (HOSPITAL_COMMUNITY): Payer: Medicare Other | Admitting: Speech Pathology

## 2014-03-15 ENCOUNTER — Inpatient Hospital Stay (HOSPITAL_COMMUNITY): Payer: Medicare Other | Admitting: Physical Therapy

## 2014-03-15 ENCOUNTER — Inpatient Hospital Stay (HOSPITAL_COMMUNITY): Payer: Medicare Other

## 2014-03-15 ENCOUNTER — Inpatient Hospital Stay (HOSPITAL_COMMUNITY): Payer: Medicare Other | Admitting: Occupational Therapy

## 2014-03-15 DIAGNOSIS — R482 Apraxia: Secondary | ICD-10-CM

## 2014-03-15 LAB — FERRITIN: Ferritin: 248 ng/mL (ref 10–291)

## 2014-03-15 LAB — IRON AND TIBC
Iron: 39 ug/dL — ABNORMAL LOW (ref 42–145)
Saturation Ratios: 18 % — ABNORMAL LOW (ref 20–55)
TIBC: 218 ug/dL — ABNORMAL LOW (ref 250–470)
UIBC: 179 ug/dL (ref 125–400)

## 2014-03-15 LAB — FACTOR 5 LEIDEN

## 2014-03-15 LAB — FOLATE: Folate: 18.7 ng/mL

## 2014-03-15 LAB — CBC
HCT: 31.7 % — ABNORMAL LOW (ref 36.0–46.0)
Hemoglobin: 9.8 g/dL — ABNORMAL LOW (ref 12.0–15.0)
MCH: 29.5 pg (ref 26.0–34.0)
MCHC: 30.9 g/dL (ref 30.0–36.0)
MCV: 95.5 fL (ref 78.0–100.0)
Platelets: 563 10*3/uL — ABNORMAL HIGH (ref 150–400)
RBC: 3.32 MIL/uL — ABNORMAL LOW (ref 3.87–5.11)
RDW: 18.7 % — ABNORMAL HIGH (ref 11.5–15.5)
WBC: 9.3 10*3/uL (ref 4.0–10.5)

## 2014-03-15 LAB — VITAMIN B12: Vitamin B-12: 409 pg/mL (ref 211–911)

## 2014-03-15 LAB — OCCULT BLOOD X 1 CARD TO LAB, STOOL: Fecal Occult Bld: NEGATIVE

## 2014-03-15 LAB — HEPARIN LEVEL (UNFRACTIONATED): Heparin Unfractionated: 0.49 IU/mL (ref 0.30–0.70)

## 2014-03-15 MED ORDER — WARFARIN SODIUM 5 MG PO TABS
5.0000 mg | ORAL_TABLET | Freq: Once | ORAL | Status: AC
  Administered 2014-03-15: 5 mg via ORAL
  Filled 2014-03-15: qty 1

## 2014-03-15 MED ORDER — POLYSACCHARIDE IRON COMPLEX 150 MG PO CAPS
150.0000 mg | ORAL_CAPSULE | Freq: Two times a day (BID) | ORAL | Status: DC
  Administered 2014-03-15 – 2014-04-05 (×43): 150 mg via ORAL
  Filled 2014-03-15 (×46): qty 1

## 2014-03-15 MED ORDER — WARFARIN - PHARMACIST DOSING INPATIENT
Freq: Every day | Status: DC
Start: 2014-03-15 — End: 2014-04-05
  Administered 2014-03-16 – 2014-04-04 (×2)

## 2014-03-15 MED ORDER — CHLORHEXIDINE GLUCONATE 0.12 % MT SOLN
15.0000 mL | Freq: Two times a day (BID) | OROMUCOSAL | Status: DC
  Administered 2014-03-15 – 2014-04-05 (×44): 15 mL via OROMUCOSAL
  Filled 2014-03-15 (×46): qty 15

## 2014-03-15 MED ORDER — PATIENT'S GUIDE TO USING COUMADIN BOOK
Freq: Once | Status: AC
  Administered 2014-03-15: 12:00:00
  Filled 2014-03-15: qty 1

## 2014-03-15 MED ORDER — WARFARIN VIDEO
Freq: Once | Status: AC
  Administered 2014-03-15: 12:00:00

## 2014-03-15 NOTE — Progress Notes (Signed)
BLE dopplers revealed DVT R-distal CFV, FV, popliteal vein and DVT L-FV and popliteal vein.  2D echo revealed EF 65-70% with interatrial septal aneurysm.  Patient started on IV heparin for treatment on 03/04 and tolerated extubated without difficulty. Protein C, Protein S and lupus anticoagulant negative. Surgery consult recommended for work up once patient stable and question of thyroid malignancy. Dr. Leonie Man recommends starting oral anticoagulant one week for stroke due to paradoxical embolic. To repeat CCT on 03/10 and if negative for bleed to start  Pradaxa  Subjective/Complaints: Pt severely aphasic. occ groans but no intelligible speech Pt taking <25% of meal but per daughter would eat more if encouraged ROS: cannot obtain due to aphasia Objective: Vital Signs: Blood pressure 120/55, pulse 87, temperature 99 F (37.2 C), temperature source Oral, resp. rate 16, height _0  (1.651 m), SpO2 96 %. Ct Head Wo Contrast  03/15/2014   CLINICAL DATA:  Stroke follow-up.  EXAM: CT HEAD WITHOUT CONTRAST  TECHNIQUE: Contiguous axial images were obtained from the base of the skull through the vertex without intravenous contrast.  COMPARISON:  03/11/2014  FINDINGS: An evolving left MCA territory infarct is again seen with greatest involvement of the frontal lobe, insula, and basal ganglia. Cytotoxic edema has slightly increased from the prior study. There is no significant mass effect. A tiny focus of subarachnoid hemorrhage is again seen in the right central sulcus near the vertex. There is also a small focus of subarachnoid hemorrhage in the left central sulcus, not clearly present on the prior study. No acute parenchymal hemorrhage is identified. There is no hydrocephalus. No extra-axial fluid collection is present.  Orbits are unremarkable. Mastoid air cells are clear. Small left maxillary sinus mucous retention cyst is noted.  IMPRESSION: 1. Evolving left MCA territory infarct with slightly increased edema.  No midline shift. 2. Trace subarachnoid hemorrhage with a new, tiny focus in the left central sulcus.   Electronically Signed   By: Logan Bores   On: 03/15/2014 08:09   Results for orders placed or performed during the hospital encounter of 03/13/14 (from the past 72 hour(s))  Glucose, capillary     Status: None   Collection Time: 03/13/14  4:21 PM  Result Value Ref Range   Glucose-Capillary 96 70 - 99 mg/dL  Glucose, capillary     Status: Abnormal   Collection Time: 03/13/14  9:11 PM  Result Value Ref Range   Glucose-Capillary 108 (H) 70 - 99 mg/dL  CBC WITH DIFFERENTIAL     Status: Abnormal   Collection Time: 03/14/14  4:23 AM  Result Value Ref Range   WBC 8.6 4.0 - 10.5 K/uL   RBC 2.95 (L) 3.87 - 5.11 MIL/uL   Hemoglobin 8.8 (L) 12.0 - 15.0 g/dL   HCT 27.8 (L) 36.0 - 46.0 %   MCV 94.2 78.0 - 100.0 fL   MCH 29.8 26.0 - 34.0 pg   MCHC 31.7 30.0 - 36.0 g/dL   RDW 17.6 (H) 11.5 - 15.5 %   Platelets 509 (H) 150 - 400 K/uL   Neutrophils Relative % 78 (H) 43 - 77 %   Neutro Abs 6.7 1.7 - 7.7 K/uL   Lymphocytes Relative 11 (L) 12 - 46 %   Lymphs Abs 0.9 0.7 - 4.0 K/uL   Monocytes Relative 8 3 - 12 %   Monocytes Absolute 0.7 0.1 - 1.0 K/uL   Eosinophils Relative 2 0 - 5 %   Eosinophils Absolute 0.2 0.0 - 0.7 K/uL  Basophils Relative 1 0 - 1 %   Basophils Absolute 0.1 0.0 - 0.1 K/uL  Comprehensive metabolic panel     Status: Abnormal   Collection Time: 03/14/14  4:23 AM  Result Value Ref Range   Sodium 142 135 - 145 mmol/L   Potassium 3.5 3.5 - 5.1 mmol/L   Chloride 113 (H) 96 - 112 mmol/L   CO2 26 19 - 32 mmol/L   Glucose, Bld 101 (H) 70 - 99 mg/dL   BUN 6 6 - 23 mg/dL   Creatinine, Ser 0.46 (L) 0.50 - 1.10 mg/dL   Calcium 8.3 (L) 8.4 - 10.5 mg/dL   Total Protein 4.6 (L) 6.0 - 8.3 g/dL   Albumin 2.4 (L) 3.5 - 5.2 g/dL   AST 17 0 - 37 U/L   ALT 10 0 - 35 U/L   Alkaline Phosphatase 48 39 - 117 U/L   Total Bilirubin 1.3 (H) 0.3 - 1.2 mg/dL   GFR calc non Af Amer >90 >90  mL/min   GFR calc Af Amer >90 >90 mL/min    Comment: (NOTE) The eGFR has been calculated using the CKD EPI equation. This calculation has not been validated in all clinical situations. eGFR's persistently <90 mL/min signify possible Chronic Kidney Disease.    Anion gap 3 (L) 5 - 15  Heparin level (unfractionated)     Status: Abnormal   Collection Time: 03/14/14  4:23 AM  Result Value Ref Range   Heparin Unfractionated 0.27 (L) 0.30 - 0.70 IU/mL    Comment:        IF HEPARIN RESULTS ARE BELOW EXPECTED VALUES, AND PATIENT DOSAGE HAS BEEN CONFIRMED, SUGGEST FOLLOW UP TESTING OF ANTITHROMBIN III LEVELS.   Glucose, capillary     Status: Abnormal   Collection Time: 03/14/14  6:48 AM  Result Value Ref Range   Glucose-Capillary 116 (H) 70 - 99 mg/dL  Vitamin B12     Status: None   Collection Time: 03/14/14 10:52 AM  Result Value Ref Range   Vitamin B-12 409 211 - 911 pg/mL    Comment: Performed at [facility]  Folate     Status: None   Collection Time: 03/14/14 10:52 AM  Result Value Ref Range   Folate 18.7 ng/mL    Comment: (NOTE) Reference Ranges        Deficient:       0.4 - 3.3 ng/mL        Indeterminate:   3.4 - 5.4 ng/mL        Normal:              > 5.4 ng/mL Performed at [facility]   Iron and TIBC     Status: Abnormal   Collection Time: 03/14/14 10:52 AM  Result Value Ref Range   Iron 39 (L) 42 - 145 ug/dL   TIBC 218 (L) 250 - 470 ug/dL   Saturation Ratios 18 (L) 20 - 55 %   UIBC 179 125 - 400 ug/dL    Comment: Performed at [facility]  Ferritin     Status: None   Collection Time: 03/14/14 10:52 AM  Result Value Ref Range   Ferritin 248 10 - 291 ng/mL    Comment: Performed at [facility]  Reticulocytes     Status: Abnormal   Collection Time: 03/14/14 10:52 AM  Result Value Ref Range   Retic Ct Pct 5.9 (H) 0.4 - 3.1 %   RBC. 3.41 (L) 3.87 - 5.11  MIL/uL   Retic Count, Manual 201.2 (H) 19.0 - 186.0 K/uL     HEENT: tongue  midline, no obvous neck swelling Cardio: RRR and no murmur Resp: CTA B/L and unlabored GI: BS positive and NT< ND Extremity:  Edema LUE> LLE Skin:   Bruise multiple ecchymotic areas BUE Neuro: Lethargic, Cranial Nerve Abnormalities RIght central 7, Abnormal Sensory cannot assess due to aphasia, Abnormal Motor 0/5 RUE, 2- RLE, antigravity on Left but has receptive difficulties and apraxia limiting MMT, Dysarthric, Aphasic and Apraxic Musc/Skel:  Other No lknee pain or effusion noted tolerates palpation Gen NAD, IV hep reunning   Assessment/Plan: 1. Functional deficits secondary to Large left MCA infarct embolic which require 3+ hours per day of interdisciplinary therapy in a comprehensive inpatient rehab setting. Physiatrist is providing close team supervision and 24 hour management of active medical problems listed below. Physiatrist and rehab team continue to assess barriers to discharge/monitor patient progress toward functional and medical goals. Neuro to review CT and determine if Pt can be switched from IV heparin to Pradaxa FIM: FIM - Bathing Bathing: 1: Two helpers  FIM - Upper Body Dressing/Undressing Upper body dressing/undressing: 1: Total-Patient completed less than 25% of tasks FIM - Lower Body Dressing/Undressing Lower body dressing/undressing: 1: Two helpers  FIM - Toileting Toileting: 0: Activity did not occur     FIM - Control and instrumentation engineer Devices: Bed rails, HOB elevated Bed/Chair Transfer: 1: Two helpers  FIM - Locomotion: Wheelchair Locomotion: Wheelchair: 0: Activity did not occur FIM - Locomotion: Ambulation Locomotion: Ambulation: 0: Activity did not occur (Unsafe at this time)  Comprehension Comprehension Mode: Auditory Comprehension:  (UTA due to aphasia )  Expression Expression Mode: Nonverbal Expression Assistive Devices:  (head nods ) Expression: 1-Expresses basis less than 25% of the time/requires cueing greater than  75% of the time.  Social Interaction Social Interaction:  (UTA )  Problem Solving Problem Solving:  (UTA )  Memory Memory Mode:  (UTA due to aphasia ) Memory:  (UTA )  Medical Problem List and Plan: 1. Functional deficits secondary to Large L-MCA infarct with dense right hemiplegia, dysphagia and global aphasia.   2.  DVT Prophylaxis/Anticoagulation: Pharmaceutical: Heparin--oral agent to start on 3/11 3. Pain Management: tramadol, tylenol, appropriate turning in bed, regular checks for comfort 4. Mood: LCSW to follow for evaluation and support.   5. Neuropsych: This patient is not capable of making decisions on her own behalf. 6. Skin/Wound Care:  Turn patient every 4 hours.  Rehab RN to monitor skin daily.  Maintain adequate nutrition and hydration status.   7. Fluids/Electrolytes/Nutrition:  Monitor I/O. IVF at nights to help maintain adequate hydration.   -will need a lot of encouragement and supervision to meet caloric needs. 8. Hypertension: normotensive at present 9.  Thyroid mass question carcinoma, further eval as pt recovers from severe CVA 10.  Hx bilateral knee OA, saw ortho as outpt, diclofenac gel LOS (Days) 2 A FACE TO FACE EVALUATION WAS PERFORMED  KIRSTEINS,ANDREW E 03/15/2014, 8:32 AM

## 2014-03-15 NOTE — Progress Notes (Signed)
ANTICOAGULATION CONSULT NOTE - Follow Up Consult  Pharmacy Consult for Heparin  Indication: pulmonary embolus and DVT with CVA  No Known Allergies  Patient Measurements: Height: 5\' 5"  (165.1 cm) IBW/kg (Calculated) : 57  Vital Signs: Temp: 99 F (37.2 C) (03/10 0534) Temp Source: Oral (03/10 0534) BP: 120/55 mmHg (03/10 0534) Pulse Rate: 87 (03/10 0534)  Labs:  Recent Labs  03/12/14 1900 03/13/14 0703 03/14/14 0423  HGB  --  9.1* 8.8*  HCT  --  28.7* 27.8*  PLT  --  528* 509*  HEPARINUNFRC 0.28* 0.22* 0.27*  CREATININE  --   --  0.46*    Estimated Creatinine Clearance: 59.4 mL/min (by C-G formula based on Cr of 0.46).   Assessment: 81 yo female with CVA and also PE and DVT continues on heparin. Heparin is 0.49 slightly above goal on 1350 units/hr, with lower goal d/t recent stroke. CT head done this am and evaluated by Dr. Leonie Man with recommendations to start patient on coumadin with heparin bridge. INR 1.18 on 3/3 Hgb 8.8, low stable, plt 509K. Iron level low, on PO iron supplement.   Goal of Therapy:  Heparin level 0.25-0.35 units/ml Monitor platelets by anticoagulation protocol: Yes   Plan:  Coumadin 5 mg po x 1 Daily PT/INR Decrease heparin slightly to 1300 units / hr Daily HL/CBC Monitor s/sx of bleeding  Maryanna Shape, PharmD, BCPS  Clinical Pharmacist  Pager: [phone]  03/15/2014 11:18 AM

## 2014-03-15 NOTE — Progress Notes (Signed)
Speech Language Pathology Daily Session Note  Patient Details  Name: Shilpa Bushee MRN: [id number] Date of Birth: 05/25/1932  Today's Date: 03/15/2014 SLP Individual Time: 0800-0900 SLP Individual Time Calculation (min): 60 min  Short Term Goals: Week 1: SLP Short Term Goal 1 (Week 1): Pt will improve accuracy of yes/no responses to basic, biographical questions to >50% with max assist multimodal cuing.   SLP Short Term Goal 2 (Week 1): Pt will improve focused attention to therapist or during basic, familiar tasks for 15-30 seconds with max assist.  SLP Short Term Goal 3 (Week 1): Pt will complete verbal automatic sequences for 25% accuracy with max assist multimodal cues.   SLP Short Term Goal 4 (Week 1): Pt will follow 1 step commands during basic, familiar tasks for 50% accuracy with max assist multimodal cuing.   SLP Short Term Goal 5 (Week 1): Pt will scan to the right of midline during basic, familiar tasks over ~50% of observable opportunities with max multimodal cues.    Skilled Therapeutic Interventions:  Pt was seen for skilled ST targeting goals for dysphagia and ongoing family education.  Pt was seated upright in bed, awake, lethargic, nonverbal, and pt's daughter, [name], was present for the duration of today's therapy session.  SLP facilitated the session with max to hand over hand assist for rate and portion control with pt exhibiting wet, congested vocal quality and no cough or throat clear which is concerning for silent aspiration of both honey thick liquids and pureed textures.  Furthermore, pt unable to generate a volitional throat clear or cough due to apraxia and cognitive deficits.  Pt's daughter was educated regarding SLP's concerns including pt's risk of aspiration due to lethargy.  Pt's daughter verbalized understanding of education and has been very involved in pt's care.  Following conversations with [name] today and [name] yesterday, SLP feels comfortable allowing  pt's daughters to provide supervision during pt's meals as they were both able to recall pt's swallowing precautions with mod I.   RN made aware.  SLP also spoke with PA about concerns regarding pt's diet toleration given her fluctuating alertness.  Will plan for repeat objective swallow study tomorrow to determine safest diet consistencies.  Continue per current plan of care.     FIM:  Comprehension Comprehension Mode: Auditory Comprehension: 1-Understands basic less than 25% of the time/requires cueing 75% of the time Expression Expression Mode: Nonverbal Expression: 1-Expresses basis less than 25% of the time/requires cueing greater than 75% of the time. Social Interaction Social Interaction: 2-Interacts appropriately 25 - 49% of time - Needs frequent redirection. Problem Solving Problem Solving: 1-Solves basic less than 25% of the time - needs direction nearly all the time or does not effectively solve problems and may need a restraint for safety Memory Memory: 1-Recognizes or recalls less than 25% of the time/requires cueing greater than 75% of the time FIM - Eating Eating Activity: 2: Hand over hand assist;3: Helper scoops food on utensil every scoop;4: Helper checks for pocketed food  Pain Pain Assessment Pain Assessment: Faces Faces Pain Scale: No hurt  Therapy/Group: Individual Therapy  Emmeline Winebarger, Selinda Orion 03/15/2014, 12:26 PM

## 2014-03-15 NOTE — Progress Notes (Signed)
Occupational Therapy Session Note  Patient Details  Name: Sandy Barrett MRN: [id number] Date of Birth: 01-22-1932  Today's Date: 03/15/2014 OT Individual Time: 6270-3500 OT Individual Time Calculation (min): 47 min    Short Term Goals: Week 1:  OT Short Term Goal 1 (Week 1): Pt will complete bathing with mod assist and mod cues for initiation OT Short Term Goal 2 (Week 1): Pt will complete UB dressing with mod assist and mod cues for initiation OT Short Term Goal 3 (Week 1): Pt will complete LB dressing with max assist of one caregiver with mod cues for initiation OT Short Term Goal 4 (Week 1): Pt will complete toilet transfer with max assist of one caregiver   Skilled Therapeutic Interventions/Progress Updates:    Engaged in ADL retraining with focus on bed mobility, initiation, sequencing, following directions, dynamic sitting balance, and functional transfers.  Pt received in bed more alert and engaged this session.  LB bathing and dressing completed at bed level with focus on initiation and sequencing of rolling, pt required mod assist with rolling to Rt and total assist rolling to Lt due to Lt sided weakness and neglect.  UB bathing and dressing completed in sitting at EOB with therapist positioned to pt's Rt to promote awareness of Rt environment and retrieving items from just Rt of midline.  Pt perseverated on washing face, requiring total assist to wash UB and don shirt.  Pt with improved sitting balance this session.  Noted initiation with stand pivot bed > recliner with pt beginning to scoot forward and position hands in preparation to transfer, requiring cues to wait for therapists' assist.  +2 stand pivot transfer bed > recliner with increased initiation and participation with transfer.  Left in recliner with pt's daughter, [name], present.  Therapy Documentation Precautions:  Precautions Precautions: Fall Precaution Comments: [name] Restrictions Weight  Bearing Restrictions: No Other Position/Activity Restrictions: R shoulder with sublux Pain: Pain Assessment Pain Assessment: Faces Faces Pain Scale: No hurt  See FIM for current functional status  Therapy/Group: Individual Therapy  Simonne Come 03/15/2014, 12:17 PM

## 2014-03-15 NOTE — Progress Notes (Signed)
Physical Therapy Session Note  Patient Details  Name: Sandy Barrett MRN: [id number] Date of Birth: 10-03-1932  Today's Date: 03/15/2014 PT Individual Time: 1430-1600 PT Individual Time Calculation (min): 90 min   Short Term Goals: Week 1:  PT Short Term Goal 1 (Week 1): Pt will perform supine > sit with max A with HOB flat using rail with 75% cueing. PT Short Term Goal 2 (Week 1): Pt will perform sit > supine with HOB flat requiring max A and 75% cueing. PT Short Term Goal 3 (Week 1): Pt will consistently transfer from bed <> w/c with Max A of single therapist with 75% cueing. PT Short Term Goal 4 (Week 1):  Pt will perform w/c mobility x25' in controlled environment with Max A and 75% cueing for visual scanning/attention to R visual field. PT Short Term Goal 5 (Week 1): Pt will perform dynamic standing balance x3 consecutive minutes with LE support, without UE support requiring Mod A and 50% cueing.  Skilled Therapeutic Interventions/Progress Updates:    Pt received semi reclined in bed with daughter, Sandy Barrett, present. Pt agreeable to therapy. Session focused on functional transfers and standing for increased RLE weightbearing/activation, midline orientation, and for RLE tone management. See below for detailed description of assist/cueing required with all bed mobility, transfers from bed <> w/c <> mat table.. Noted significant RLE extensor tone during transfers and transitional movements. Also noted clonus (1-2 beats) in R soleus.   Modified w/c to 18"x18" tilt-in-space w/c for improved fit; retrieved specialty cushion to preserve skin integrity. Attempted w/c self-propulsion with LLE only (LUE propulsion not performed due to no hand rim on tilt-in-space w/c); however, pt unable to consistently perform LLE flexion/extension pattern necessary for functional w/c propulsion. Therefore, transported pt to treatment gym in w/c with total A. Pt performed multiple static standing trials for 30  seconds-2 minutes with +2A, manual stabilization of RLE (medial/lateral to R knee) due to decreased muscle activation; multimodal cueing for upright posture, bilat hip extension, and lateral weight shift to R side. See below for detailed description of assist/cueing required with static/dynamic sitting balance activities. During final minutes of dynamic sitting trial, pt required mod to max A for stability due to fatigue, tendency toward posterior LOB. Session ene pded in pt room, where pt was left semi reclined in bed (per RN request) with 3 rails up, bed alarm on, daughter and RN present, and all needs within reach.  Therapy Documentation Precautions:  Precautions Precautions: Fall Precaution Comments: [name] Restrictions Weight Bearing Restrictions: No Other Position/Activity Restrictions: R shoulder with sublux Vital Signs: Therapy Vitals Temp: 98.9 F (37.2 C) Temp Source: Oral Pulse Rate: 91 Resp: 16 BP: (!) 113/48 mmHg (reported to [place], Therapist, sports) Patient Position (if appropriate): Lying Oxygen Therapy SpO2: 94 % O2 Device: Not Delivered Pain: Pain Assessment Pain Assessment: Faces Mobility: Bed Mobility Bed Mobility: Supine to Sit;Sit to Supine;Scooting to HOB Supine to Sit: With rails;2: Max assist Supine to Sit Details: Tactile cues for initiation;Manual facilitation for placement;Tactile cues for sequencing;Visual cues/gestures for sequencing;Manual facilitation for weight shifting Sit to Supine: 1: +2 Total assist;HOB flat Sit to Supine: Patient Percentage: 10% Sit to Supine - Details: Tactile cues for initiation;Tactile cues for sequencing;Tactile cues for weight beaing Sit to Supine - Details (indicate cue type and reason): Performed sit > supine with +2A with therapist posterior to pt providing manual stabilization at R shoulder during transition from sit > R forearm propping (for inreased RUE weightbearing, activation); additional therapist assisted  with bilat LE management. Scooting to [facility]: 1: +2 Total assist Scooting to [facility]: Patient Percentage: 0% Transfers Transfers: Yes Sit to Stand: 1: +1 Total assist;Other/comment;From toilet (from mat table) Sit to Stand Details: Manual facilitation for weight shifting;Tactile cues for initiation;Tactile cues for posture;Visual cues/gestures for sequencing;Tactile cues for weight beaing Sit to Stand Details (indicate cue type and reason): manual stabilization of RLE (medial/lateral to R knee), R knee approximation due to weakness, extensor tone Stand to Sit: 1: +1 Total assist;Other (comment) (to mat table) Stand to Sit Details (indicate cue type and reason): Visual cues/gestures for sequencing;Manual facilitation for weight shifting;Tactile cues for weight beaing Stand to Sit Details: manual stabilization of RLE (medial/lateral to R knee) due to weakness, extensor tone Squat Pivot Transfers: 1: +2 Total assist;With armrests Squat Pivot Transfer Details: Verbal cues for precautions/safety;Visual cues/gestures for sequencing;Tactile cues for weight shifting;Manual facilitation for placement Balance: Static Sitting Balance Static Sitting - Balance Support: Left upper extremity supported;Feet supported Static Sitting - Level of Assistance: 5: Stand by assistance;4: Min assist Static Sitting - Comment/# of Minutes: Pt performed static sitting EOB x3 minutes with Min guard and no overt LOB Dynamic Sitting Balance Dynamic Sitting - Balance Support: Feet supported;Left upper extremity supported;Bilateral upper extremity supported Dynamic Sitting - Level of Assistance: 3: Mod assist;2: Max assist;4: Min assist Sitting balance - Comments: Seated EOM, pt utilized LLE to kick ball to utilize automatic movement to promote lateral weight shifting to R for increased RUE/LE weightbearing, balance perturbations in seated.  See FIM for current functional status  Therapy/Group: Individual Therapy  [name], [name] 03/15/2014, 6:59 PM

## 2014-03-15 NOTE — Progress Notes (Signed)
CT head done this am and evaluated by Dr. Leonie Man with recommendations to start patient on coumadin with heparin bridge. Will order and monitor H/H and neurologically. Lethargy resolved but patient with severe apraxia as well as aphasia affecting swallow. She also has a large thyroid mass displacing trachea which likely is also affecting her swallow. ST to repeat MBS tomorrow.

## 2014-03-15 NOTE — IPOC Note (Addendum)
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   Dysphagia due to recent stroke     Functional Problem List: Nursing Pain  PT Balance, Skin Integrity, Motor, Perception, Safety, Sensory  OT Balance, Cognition, Edema, Endurance, Motor, Perception, Safety, Sensory, Vision  SLP Cognition, Linguistic, Nutrition  TR Activity tolerance, functional mobility, balance, cognition, safety, pain, anxiety/stress        Basic ADL's: OT Eating, Grooming, Bathing, Dressing, Toileting     Advanced  ADL's: OT       Transfers: PT Bed Mobility, Bed to Chair  OT Toilet, Tub/Shower     Locomotion: PT Wheelchair Mobility     Additional Impairments: OT Fuctional Use of Upper Extremity  SLP Swallowing, Communication, Social Cognition comprehension, expression Problem Solving, Memory, Attention, Awareness  TR      Anticipated Outcomes Item Anticipated Outcome  Self Feeding min assist  Swallowing  min assist with least restrictive diet    Basic self-care  mod assist  Toileting  mod assist   Bathroom Transfers mod assist  Bowel/Bladder  Continent bowel and bladder with timed toileting, mod. assist  Transfers  Min to Mod A  Locomotion  Min A at w/c level  Communication  max assist   Cognition  max assist   Pain  Managed 3/10  Safety/Judgment  Increased safety awareness, no unsafe behaviors, no falls or injury, anticipates needs   Therapy Plan: PT Intensity: Minimum of 1-2 x/day ,45 to 90 minutes PT Frequency: 5 out of 7  days PT Duration Estimated Length of Stay: 28 days OT Intensity: Minimum of 1-2 x/day, 45 to 90 minutes OT Frequency: 5 out of 7 days OT Duration/Estimated Length of Stay: 24-28 days SLP Intensity: Minumum of 1-2 x/day, 30 to 90 minutes SLP Frequency: 3 to 5 out of 7 days SLP Duration/Estimated Length of Stay: 21-28 days   TR Duration/ELOS:  4 weeks TR Frequency:  Min 1 time per week >20 minutes        Team Interventions: Nursing Interventions Patient/Family Education, Bladder Management, Bowel Management, Disease Management/Prevention, Discharge Planning, Dysphagia/Aspiration Precaution Training, Medication Management, Skin Care/Wound Management  PT interventions Balance/vestibular training, Cognitive remediation/compensation, Disease management/prevention, Discharge planning, DME/adaptive equipment instruction, Functional mobility training, Patient/family education, Neuromuscular re-education, Skin care/wound management, Splinting/orthotics, Wheelchair propulsion/positioning, UE/LE Strength taining/ROM, UE/LE Coordination activities, Visual/perceptual remediation/compensation, Therapeutic Exercise, Therapeutic Activities  OT Interventions Balance/vestibular training, Cognitive remediation/compensation, Discharge planning, Disease mangement/prevention, DME/adaptive equipment instruction, Functional mobility training, Neuromuscular re-education, Pain management, Patient/family education, Psychosocial support, Self Care/advanced ADL retraining, Skin care/wound managment, Splinting/orthotics, Therapeutic Activities, Therapeutic Exercise, UE/LE Strength taining/ROM, UE/LE Coordination activities, Visual/perceptual remediation/compensation  SLP Interventions Cognitive remediation/compensation, Cueing hierarchy, Dysphagia/aspiration precaution training, Functional tasks, Patient/family education, Speech/Language facilitation, Multimodal communication approach, Internal/external aids, Environmental  controls  TR Interventions Recreation/leisure participation, Balance/Vestibular training, functional mobility, therapeutic activities, UE/LE strength/coordination, cognitive retraining/compensation, w/c mobility, community reintegration, pt/family education, adaptive equipment instruction/use, discharge planning, psychosocial support  SW/CM Interventions Discharge Planning, Psychosocial Support, Patient/Family Education    Team Discharge Planning: Destination: PT-Skilled Nursing Facility (SNF) ,  [place] (SNF) , [place] (SNF) Projected Follow-up: PT-Skilled nursing facility, OT-  Skilled nursing facility, SLP-Skilled Nursing facility, 24 hour supervision/assistance Projected Equipment Needs: PT-To be determined, OT- 3 in 1 bedside comode, Tub/shower bench, SLP-  Equipment Details: PT- , OT-  Patient/family involved in discharge planning: PT- Family member/caregiver,  OT-Family member/caregiver, SLP-Family member/caregiver  MD ELOS: 28 days Medical Rehab Prognosis:  Good Assessment: The patient has been admitted for CIR therapies with the diagnosis of left MCA infarct. The team will be addressing functional mobility, strength, stamina, balance, safety, adaptive techniques and equipment, self-care, bowel and bladder mgt, patient and caregiver education, NMR, activity tolerance, language, swallowing, cognition, stroke education. Goals have been set at min to mod assist for basic mobility and self-care and max assist with swallowing, language, cognition.    Meredith Staggers, MD, FAAPMR      See Team Conference Notes for weekly updates to the plan of care

## 2014-03-15 NOTE — Discharge Instructions (Addendum)
Inpatient Rehab Discharge Instructions  [name] Discharge date and time:    Activities/Precautions/ Functional Status: Activity: activity as tolerated Diet:  Wound Care:  Functional status:  ___ No restrictions     ___ Walk up steps independently ___ 24/7 supervision/assistance   ___ Walk up steps with assistance ___ Intermittent supervision/assistance  ___ Bathe/dress independently ___ Walk with walker     ___ Bathe/dress with assistance ___ Walk Independently    ___ Shower independently ___ Walk with assistance    ___ Shower with assistance ___ No alcohol     ___ Return to work/school ________  Special Instructions:  STROKE/TIA DISCHARGE INSTRUCTIONS SMOKING Cigarette smoking nearly doubles your risk of having a stroke & is the single most alterable risk factor  If you smoke or have smoked in the last 12 months, you are advised to quit smoking for your health.  Most of the excess cardiovascular risk related to smoking disappears within a year of stopping.  Ask you doctor about anti-smoking medications  [place] Quit Line: 1-800-QUIT NOW  Free Smoking Cessation Classes [phone]  CHOLESTEROL Know your levels; limit fat & cholesterol in your diet  Lipid Panel     Component Value Date/Time   CHOL 95 03/09/2014 0450   TRIG 54 03/09/2014 0450   HDL 31* 03/09/2014 0450   CHOLHDL 3.1 03/09/2014 0450   VLDL 11 03/09/2014 0450   LDLCALC 53 03/09/2014 0450      Many patients benefit from treatment even if their cholesterol is at goal.  Goal: Total Cholesterol (CHOL) less than 160  Goal:  Triglycerides (TRIG) less than 150  Goal:  HDL greater than 40  Goal:  LDL (LDLCALC) less than 100   BLOOD PRESSURE American Stroke Association blood pressure target is less that 120/80 mm/Hg  Your discharge blood pressure is:  BP: (!) 122/50 mmHg  Monitor your blood pressure  Limit your salt and alcohol intake  Many individuals will require more than one medication for high  blood pressure  DIABETES (A1c is a blood sugar average for last 3 months) Goal HGBA1c is under 7% (HBGA1c is blood sugar average for last 3 months)  Diabetes: No known diagnosis of diabetes    Lab Results  Component Value Date   HGBA1C 5.4 03/09/2014     Your HGBA1c can be lowered with medications, healthy diet, and exercise.  Check your blood sugar as directed by your physician  Call your physician if you experience unexplained or low blood sugars.  PHYSICAL ACTIVITY/REHABILITATION Goal is 30 minutes at least 4 days per week  Activity: No driving, Therapies: See above Return to work:  N/A  Activity decreases your risk of heart attack and stroke and makes your heart stronger.  It helps control your weight and blood pressure; helps you relax and can improve your mood.  Participate in a regular exercise program.  Talk with your doctor about the best form of exercise for you (dancing, walking, swimming, cycling).  DIET/WEIGHT Goal is to maintain a healthy weight  Your discharge diet is: DIET - DYS 1  liquids Your height is:    Your current weight is:   Your Body Mass Index (BMI) is:     Following the type of diet specifically designed for you will help prevent another stroke.  Your goal weight is:    Your goal Body Mass Index (BMI) is 19-24.  Healthy food habits can help reduce 3 risk factors for stroke:  High cholesterol, hypertension, and excess weight.  RESOURCES Stroke/Support Group:  Call [phone]   STROKE EDUCATION PROVIDED/REVIEWED AND GIVEN TO PATIENT Stroke warning signs and symptoms How to activate emergency medical system (call 911). Medications prescribed at discharge. Need for follow-up after discharge. Personal risk factors for stroke. Pneumonia vaccine given:  Flu vaccine given:  My questions have been answered, the writing is legible, and I understand these instructions.  I will adhere to these goals & educational materials that have been provided to me  after my discharge from the hospital.       My questions have been answered and I understand these instructions. I will adhere to these goals and the provided educational materials after my discharge from the hospital.  Patient/Caregiver Signature _______________________________ Date __________  Clinician Signature _______________________________________ Date __________  Please bring this form and your medication list with you to all your follow-up doctor's appointments.     Information on my medicine - Coumadin   (Warfarin)  This medication education was reviewed with me or my healthcare representative as part of my discharge preparation.    Why was Coumadin prescribed for you? Coumadin was prescribed for you because you have a blood clot or a medical condition that can cause an increased risk of forming blood clots. Blood clots can cause serious health problems by blocking the flow of blood to the heart, lung, or brain. Coumadin can prevent harmful blood clots from forming. As a reminder your indication for Coumadin is:   Pulmonary Embolism Treatment  What test will check on my response to Coumadin? While on Coumadin (warfarin) you will need to have an INR test regularly to ensure that your dose is keeping you in the desired range. The INR (international normalized ratio) number is calculated from the result of the laboratory test called prothrombin time (PT).  If an INR APPOINTMENT HAS NOT ALREADY BEEN MADE FOR YOU please schedule an appointment to have this lab work done by your health care provider within 7 days. Your INR goal is usually a number between:  2 to 3.  What  do you need to  know  About  COUMADIN? Take Coumadin (warfarin) exactly as prescribed by your healthcare provider about the same time each day.  DO NOT stop taking without talking to the doctor who prescribed the medication.  Stopping without other blood clot prevention medication to take the place of Coumadin  may increase your risk of developing a new clot or stroke.  Get refills before you run out.  What do you do if you miss a dose? If you miss a dose, take it as soon as you remember on the same day then continue your regularly scheduled regimen the next day.  Do not take two doses of Coumadin at the same time.  Important Safety Information A possible side effect of Coumadin (Warfarin) is an increased risk of bleeding. You should call your healthcare provider right away if you experience any of the following: ? Bleeding from an injury or your nose that does not stop. ? Unusual colored urine (red or dark brown) or unusual colored stools (red or black). ? Unusual bruising for unknown reasons. ? A serious fall or if you hit your head (even if there is no bleeding).  Some foods or medicines interact with Coumadin (warfarin) and might alter your response to warfarin. To help avoid this: ? Eat a balanced diet, maintaining a consistent amount of Vitamin K. ? Notify your provider about major diet changes you plan to make. ? Avoid  alcohol or limit your intake to 1 drink for women and 2 drinks for men per day. (1 drink is 5 oz. wine, 12 oz. beer, or 1.5 oz. liquor.)  Make sure that ANY health care provider who prescribes medication for you knows that you are taking Coumadin (warfarin).  Also make sure the healthcare provider who is monitoring your Coumadin knows when you have started a new medication including herbals and non-prescription products.  Coumadin (Warfarin)  Major Drug Interactions  Increased Warfarin Effect Decreased Warfarin Effect  Alcohol (large quantities) Antibiotics (esp. Septra/Bactrim, Flagyl, Cipro) Amiodarone (Cordarone) Aspirin (ASA) Cimetidine (Tagamet) Megestrol (Megace) NSAIDs (ibuprofen, naproxen, etc.) Piroxicam (Feldene) Propafenone (Rythmol SR) Propranolol (Inderal) Isoniazid (INH) Posaconazole (Noxafil) Barbiturates (Phenobarbital) Carbamazepine  (Tegretol) Chlordiazepoxide (Librium) Cholestyramine (Questran) Griseofulvin Oral Contraceptives Rifampin Sucralfate (Carafate) Vitamin K   Coumadin (Warfarin) Major Herbal Interactions  Increased Warfarin Effect Decreased Warfarin Effect  Garlic Ginseng Ginkgo biloba Coenzyme Q10 Green tea St. Johns wort    Coumadin (Warfarin) FOOD Interactions  Eat a consistent number of servings per week of foods HIGH in Vitamin K (1 serving =  cup)  Collards (cooked, or boiled & drained) Kale (cooked, or boiled & drained) Mustard greens (cooked, or boiled & drained) Parsley *serving size only =  cup Spinach (cooked, or boiled & drained) Swiss chard (cooked, or boiled & drained) Turnip greens (cooked, or boiled & drained)  Eat a consistent number of servings per week of foods MEDIUM-HIGH in Vitamin K (1 serving = 1 cup)  Asparagus (cooked, or boiled & drained) Broccoli (cooked, boiled & drained, or raw & chopped) Brussel sprouts (cooked, or boiled & drained) *serving size only =  cup Lettuce, raw (green leaf, endive, romaine) Spinach, raw Turnip greens, raw & chopped   These websites have more information on Coumadin (warfarin):  FailFactory.se; VeganReport.com.au;

## 2014-03-16 ENCOUNTER — Inpatient Hospital Stay (HOSPITAL_COMMUNITY): Payer: Medicare Other | Admitting: Physical Therapy

## 2014-03-16 ENCOUNTER — Inpatient Hospital Stay (HOSPITAL_COMMUNITY): Payer: Medicare Other

## 2014-03-16 ENCOUNTER — Inpatient Hospital Stay (HOSPITAL_COMMUNITY): Payer: Medicare Other | Admitting: Speech Pathology

## 2014-03-16 ENCOUNTER — Inpatient Hospital Stay (HOSPITAL_COMMUNITY): Payer: Medicare Other | Admitting: Occupational Therapy

## 2014-03-16 ENCOUNTER — Encounter (HOSPITAL_COMMUNITY): Payer: Medicare Other | Admitting: Speech Pathology

## 2014-03-16 LAB — PROTIME-INR
INR: 1.19 (ref 0.00–1.49)
Prothrombin Time: 15.3 seconds — ABNORMAL HIGH (ref 11.6–15.2)

## 2014-03-16 LAB — CBC
HCT: 29.5 % — ABNORMAL LOW (ref 36.0–46.0)
Hemoglobin: 9.4 g/dL — ABNORMAL LOW (ref 12.0–15.0)
MCH: 30 pg (ref 26.0–34.0)
MCHC: 31.9 g/dL (ref 30.0–36.0)
MCV: 94.2 fL (ref 78.0–100.0)
Platelets: 444 10*3/uL — ABNORMAL HIGH (ref 150–400)
RBC: 3.13 MIL/uL — ABNORMAL LOW (ref 3.87–5.11)
RDW: 19 % — ABNORMAL HIGH (ref 11.5–15.5)
WBC: 8.6 10*3/uL (ref 4.0–10.5)

## 2014-03-16 LAB — HEPARIN LEVEL (UNFRACTIONATED): Heparin Unfractionated: 0.4 IU/mL (ref 0.30–0.70)

## 2014-03-16 LAB — OCCULT BLOOD X 1 CARD TO LAB, STOOL: Fecal Occult Bld: POSITIVE — AB

## 2014-03-16 MED ORDER — WARFARIN SODIUM 5 MG PO TABS
5.0000 mg | ORAL_TABLET | Freq: Once | ORAL | Status: AC
  Administered 2014-03-16: 5 mg via ORAL
  Filled 2014-03-16: qty 1

## 2014-03-16 NOTE — Progress Notes (Signed)
Occupational Therapy Session Note  Patient Details  Name: Sandy Barrett MRN: [id number] Date of Birth: Mar 10, 1932  Today's Date: 03/16/2014 OT Individual Time: 7829-5621 OT Individual Time Calculation (min): 60 min    Short Term Goals: Week 1:  OT Short Term Goal 1 (Week 1): Pt will complete bathing with mod assist and mod cues for initiation OT Short Term Goal 2 (Week 1): Pt will complete UB dressing with mod assist and mod cues for initiation OT Short Term Goal 3 (Week 1): Pt will complete LB dressing with max assist of one caregiver with mod cues for initiation OT Short Term Goal 4 (Week 1): Pt will complete toilet transfer with max assist of one caregiver   Skilled Therapeutic Interventions/Progress Updates:    Engaged in ADL retraining with focus on pt initiation, following one step directions, and attention to Rt body and environment.  Pt asleep upon arrival but aroused to name and touch.  Pt unable to initiate face washing this session with pt opening mouth every time therapist presented wash cloth, attempted hand over hand with pt bringing cloth to mouth and holding it there.  Engaged in oral hygiene with suction brush with total assist to attempt to address possible oral concern, with pt returning to opening mouth with attempts to wash face.  Pt incontinent of urine, therefore engaged in rolling multiple times in bed to complete hygiene with focus on pt initiating rolling with bending of Lt knee and/or reaching for bed rail with Lt hand.  Pt required mod assist with rolling to Rt, total assist when rolling Lt.  Pt with increased participation in LB dressing with picking up LLE for therapist to thread pant leg and then reaching towards waist band to pull pants up.  Required +2 for rolling in bed to pull pants over hips.  Pt left in bed at end of session awaiting RN to attend to IV and need to be in bed for transport for MBS.  Therapy Documentation Precautions:   Precautions Precautions: Fall Precaution Comments: [name] Restrictions Weight Bearing Restrictions: No Other Position/Activity Restrictions: R shoulder with sublux Pain:  Pt with some facial grimaces with movement of RUE this session, repositioned  See FIM for current functional status  Therapy/Group: Individual Therapy  Simonne Come 03/16/2014, 11:37 AM

## 2014-03-16 NOTE — Progress Notes (Signed)
Physical Therapy Session Note  Patient Details  Name: Sandy Barrett MRN: [id number] Date of Birth: 04-27-1932  Today's Date: 03/16/2014 PT Individual Time: 1330-1435 PT Individual Time Calculation (min): 65 min   Short Term Goals: Week 1:  PT Short Term Goal 1 (Week 1): Pt will perform supine > sit with max A with HOB flat using rail with 75% cueing. PT Short Term Goal 2 (Week 1): Pt will perform sit > supine with HOB flat requiring max A and 75% cueing. PT Short Term Goal 3 (Week 1): Pt will consistently transfer from bed <> w/c with Max A of single therapist with 75% cueing. PT Short Term Goal 4 (Week 1):  Pt will perform w/c mobility x25' in controlled environment with Max A and 75% cueing for visual scanning/attention to R visual field. PT Short Term Goal 5 (Week 1): Pt will perform dynamic standing balance x3 consecutive minutes with LE support, without UE support requiring Mod A and 50% cueing.  Skilled Therapeutic Interventions/Progress Updates:    2:1. Pt received semi reclined in bed with daughter present; pt nodded head "yes" and smiled when asked to participate in therapy. Session focused on RLE weightbearing for increased muscle activation and tone management. Pt performed supine > sit (to R side of increased RUE weightbearing, activation) with HOB elevated 30 degrees (due to pt having eaten within past 30 minutes) with rail requiring total A, manual facilitation of LUE across midline, manual placement of LUE on bed rail, manual advancement of bilat LE's toward EOB, and Total A to lift trunk, tactile cueing at R ribcage during transition from R side lying > sit. Pt performed squat pivot transfer from bed > w/c with +2A (hand over back technique), manual facilitation of anterior weight shift, manual stabilization of R knee (due to extensor tone). Transported pt to gym in w/c with total A due to pt inability to reach ground with LLE to perform functional self-propulsion. Transitioned to  static standing with total A of standing frame x4 consecutive minutes for RLE weightbearing, activation, and tone management. Once in standing, attempted to engage pt in LUE reaching across midline for visual scanning to R of midline. Although pt visually scanned to R of midline during this session, she was able to receive ring when placed to L of midline only. During transition from stand > sit, noted flexor tone in RUE. Session ended in pt room, where pt was left reclined in tilt-in-space w/c with quick release belt in place for safety and primary SLP present for next session. Daughter present to observe during this session.  Therapy Documentation Precautions:  Precautions Precautions: Fall Precaution Comments: [name] Restrictions Weight Bearing Restrictions: No Other Position/Activity Restrictions: R shoulder with sublux Vital Signs: Therapy Vitals Temp: 98.6 F (37 C) Temp Source: Oral Pulse Rate: 90 Resp: 18 BP: (!) 103/44 mmHg Patient Position (if appropriate): Lying Oxygen Therapy SpO2: 95 % O2 Device: Not Delivered Pain: Pain Assessment Pain Assessment: Faces Faces Pain Scale: No hurt  See FIM for current functional status  Therapy/Group: Individual Therapy  Stefano Gaul 03/16/2014, 4:34 PM

## 2014-03-16 NOTE — Progress Notes (Signed)
ANTICOAGULATION CONSULT NOTE - Follow Up Consult  Pharmacy Consult for Heparin  Indication: pulmonary embolus and DVT with CVA  No Known Allergies  Patient Measurements: Height: 5\' 5"  (165.1 cm) IBW/kg (Calculated) : 57  Vital Signs: Temp: 98.2 F (36.8 C) (03/11 0643) Temp Source: Oral (03/11 0643) BP: 105/46 mmHg (03/11 0643) Pulse Rate: 57 (03/11 0643)  Labs:  Recent Labs  03/14/14 0423 03/15/14 1215 03/15/14 1225 03/16/14 0651  HGB 8.8*  --  9.8* 9.4*  HCT 27.8*  --  31.7* 29.5*  PLT 509*  --  563* 444*  LABPROT  --   --   --  15.3*  INR  --   --   --  1.19  HEPARINUNFRC 0.27* 0.49  --  0.40  CREATININE 0.46*  --   --   --     Estimated Creatinine Clearance: 59.4 mL/min (by C-G formula based on Cr of 0.46).   Assessment: 81 yo female with CVA and also PE and DVT continues on heparin. Heparin is 0.4 on 1300 units/hr, with lower goal d/t recent stroke. CT head evaluated by Dr. Leonie Man with and started coumadin on 3/10. INR 1.19 this morning, low as expected after one dose of coumadin 5mg . Hgb 9.4, low stable, plt 444K. Iron level low, on PO iron supplement. Since pt is > 1 week after stroke, and with acute PE/DVT, will increase heparin goal 0.3-0.5.    Goal of Therapy:  Heparin level 0.3-0.5 units/ml Monitor platelets by anticoagulation protocol: Yes   Plan:  Coumadin 5 mg po x 1 Daily PT/INR Continue heparin slightly to 1300 units / hr Daily HL/CBC Monitor s/sx of bleeding   Maryanna Shape, PharmD, BCPS  Clinical Pharmacist  Pager: [phone]  03/16/2014 11:31 AM

## 2014-03-16 NOTE — Progress Notes (Signed)
BLE dopplers revealed DVT R-distal CFV, FV, popliteal vein and DVT L-FV and popliteal vein.  2D echo revealed EF 65-70% with interatrial septal aneurysm.  Patient started on IV heparin for treatment on 03/04 and tolerated extubated without difficulty. Protein C, Protein S and lupus anticoagulant negative. Surgery consult recommended for work up once patient stable and question of thyroid malignancy. Dr. Leonie Man recommends starting oral anticoagulant one week for stroke due to paradoxical embolic. To repeat CCT on 03/10 and if negative for bleed to start  Pradaxa  Subjective/Complaints: Pt severely aphasic.nods head "yes" Eating better with encouragement.  ROS: cannot obtain due to aphasia Objective: Vital Signs: Blood pressure 105/46, pulse 57, temperature 98.2 F (36.8 C), temperature source Oral, resp. rate 17, height '5\' 5"'  (1.651 m), SpO2 93 %. Ct Head Wo Contrast  03/15/2014   CLINICAL DATA:  Stroke follow-up.  EXAM: CT HEAD WITHOUT CONTRAST  TECHNIQUE: Contiguous axial images were obtained from the base of the skull through the vertex without intravenous contrast.  COMPARISON:  03/11/2014  FINDINGS: An evolving left MCA territory infarct is again seen with greatest involvement of the frontal lobe, insula, and basal ganglia. Cytotoxic edema has slightly increased from the prior study. There is no significant mass effect. A tiny focus of subarachnoid hemorrhage is again seen in the right central sulcus near the vertex. There is also a small focus of subarachnoid hemorrhage in the left central sulcus, not clearly present on the prior study. No acute parenchymal hemorrhage is identified. There is no hydrocephalus. No extra-axial fluid collection is present.  Orbits are unremarkable. Mastoid air cells are clear. Small left maxillary sinus mucous retention cyst is noted.  IMPRESSION: 1. Evolving left MCA territory infarct with slightly increased edema. No midline shift. 2. Trace subarachnoid hemorrhage  with a new, tiny focus in the left central sulcus.   Electronically Signed   By: Logan Bores   On: 03/15/2014 08:09   Results for orders placed or performed during the hospital encounter of 03/13/14 (from the past 72 hour(s))  Glucose, capillary     Status: None   Collection Time: 03/13/14  4:21 PM  Result Value Ref Range   Glucose-Capillary 96 70 - 99 mg/dL  Glucose, capillary     Status: Abnormal   Collection Time: 03/13/14  9:11 PM  Result Value Ref Range   Glucose-Capillary 108 (H) 70 - 99 mg/dL  CBC WITH DIFFERENTIAL     Status: Abnormal   Collection Time: 03/14/14  4:23 AM  Result Value Ref Range   WBC 8.6 4.0 - 10.5 K/uL   RBC 2.95 (L) 3.87 - 5.11 MIL/uL   Hemoglobin 8.8 (L) 12.0 - 15.0 g/dL   HCT 27.8 (L) 36.0 - 46.0 %   MCV 94.2 78.0 - 100.0 fL   MCH 29.8 26.0 - 34.0 pg   MCHC 31.7 30.0 - 36.0 g/dL   RDW 17.6 (H) 11.5 - 15.5 %   Platelets 509 (H) 150 - 400 K/uL   Neutrophils Relative % 78 (H) 43 - 77 %   Neutro Abs 6.7 1.7 - 7.7 K/uL   Lymphocytes Relative 11 (L) 12 - 46 %   Lymphs Abs 0.9 0.7 - 4.0 K/uL   Monocytes Relative 8 3 - 12 %   Monocytes Absolute 0.7 0.1 - 1.0 K/uL   Eosinophils Relative 2 0 - 5 %   Eosinophils Absolute 0.2 0.0 - 0.7 K/uL   Basophils Relative 1 0 - 1 %   Basophils Absolute 0.1  0.0 - 0.1 K/uL  Comprehensive metabolic panel     Status: Abnormal   Collection Time: 03/14/14  4:23 AM  Result Value Ref Range   Sodium 142 135 - 145 mmol/L   Potassium 3.5 3.5 - 5.1 mmol/L   Chloride 113 (H) 96 - 112 mmol/L   CO2 26 19 - 32 mmol/L   Glucose, Bld 101 (H) 70 - 99 mg/dL   BUN 6 6 - 23 mg/dL   Creatinine, Ser 0.46 (L) 0.50 - 1.10 mg/dL   Calcium 8.3 (L) 8.4 - 10.5 mg/dL   Total Protein 4.6 (L) 6.0 - 8.3 g/dL   Albumin 2.4 (L) 3.5 - 5.2 g/dL   AST 17 0 - 37 U/L   ALT 10 0 - 35 U/L   Alkaline Phosphatase 48 39 - 117 U/L   Total Bilirubin 1.3 (H) 0.3 - 1.2 mg/dL   GFR calc non Af Amer >90 >90 mL/min   GFR calc Af Amer >90 >90 mL/min    Comment:  (NOTE) The eGFR has been calculated using the CKD EPI equation. This calculation has not been validated in all clinical situations. eGFR's persistently <90 mL/min signify possible Chronic Kidney Disease.    Anion gap 3 (L) 5 - 15  Heparin level (unfractionated)     Status: Abnormal   Collection Time: 03/14/14  4:23 AM  Result Value Ref Range   Heparin Unfractionated 0.27 (L) 0.30 - 0.70 IU/mL    Comment:        IF HEPARIN RESULTS ARE BELOW EXPECTED VALUES, AND PATIENT DOSAGE HAS BEEN CONFIRMED, SUGGEST FOLLOW UP TESTING OF ANTITHROMBIN III LEVELS.   Glucose, capillary     Status: Abnormal   Collection Time: 03/14/14  6:48 AM  Result Value Ref Range   Glucose-Capillary 116 (H) 70 - 99 mg/dL  Vitamin B12     Status: None   Collection Time: 03/14/14 10:52 AM  Result Value Ref Range   Vitamin B-12 409 211 - 911 pg/mL    Comment: Performed at [facility]  Folate     Status: None   Collection Time: 03/14/14 10:52 AM  Result Value Ref Range   Folate 18.7 ng/mL    Comment: (NOTE) Reference Ranges        Deficient:       0.4 - 3.3 ng/mL        Indeterminate:   3.4 - 5.4 ng/mL        Normal:              > 5.4 ng/mL Performed at [facility]   Iron and TIBC     Status: Abnormal   Collection Time: 03/14/14 10:52 AM  Result Value Ref Range   Iron 39 (L) 42 - 145 ug/dL   TIBC 218 (L) 250 - 470 ug/dL   Saturation Ratios 18 (L) 20 - 55 %   UIBC 179 125 - 400 ug/dL    Comment: Performed at [facility]  Ferritin     Status: None   Collection Time: 03/14/14 10:52 AM  Result Value Ref Range   Ferritin 248 10 - 291 ng/mL    Comment: Performed at [facility]  Reticulocytes     Status: Abnormal   Collection Time: 03/14/14 10:52 AM  Result Value Ref Range   Retic Ct Pct 5.9 (H) 0.4 - 3.1 %   RBC. 3.41 (L) 3.87 - 5.11 MIL/uL   Retic Count, Manual 201.2 (H) 19.0 - 186.0 K/uL  Heparin level (unfractionated)     Status: None   Collection Time:  03/15/14 12:15 PM  Result Value Ref Range   Heparin Unfractionated 0.49 0.30 - 0.70 IU/mL    Comment:        IF HEPARIN RESULTS ARE BELOW EXPECTED VALUES, AND PATIENT DOSAGE HAS BEEN CONFIRMED, SUGGEST FOLLOW UP TESTING OF ANTITHROMBIN III LEVELS.   CBC     Status: Abnormal   Collection Time: 03/15/14 12:25 PM  Result Value Ref Range   WBC 9.3 4.0 - 10.5 K/uL   RBC 3.32 (L) 3.87 - 5.11 MIL/uL   Hemoglobin 9.8 (L) 12.0 - 15.0 g/dL   HCT 31.7 (L) 36.0 - 46.0 %   MCV 95.5 78.0 - 100.0 fL   MCH 29.5 26.0 - 34.0 pg   MCHC 30.9 30.0 - 36.0 g/dL   RDW 18.7 (H) 11.5 - 15.5 %   Platelets 563 (H) 150 - 400 K/uL  Occult blood card to lab, stool RN will collect     Status: None   Collection Time: 03/15/14  7:03 PM  Result Value Ref Range   Fecal Occult Bld NEGATIVE NEGATIVE  CBC     Status: Abnormal   Collection Time: 03/16/14  6:51 AM  Result Value Ref Range   WBC 8.6 4.0 - 10.5 K/uL   RBC 3.13 (L) 3.87 - 5.11 MIL/uL   Hemoglobin 9.4 (L) 12.0 - 15.0 g/dL   HCT 29.5 (L) 36.0 - 46.0 %   MCV 94.2 78.0 - 100.0 fL   MCH 30.0 26.0 - 34.0 pg   MCHC 31.9 30.0 - 36.0 g/dL   RDW 19.0 (H) 11.5 - 15.5 %   Platelets 444 (H) 150 - 400 K/uL  Heparin level (unfractionated)     Status: None   Collection Time: 03/16/14  6:51 AM  Result Value Ref Range   Heparin Unfractionated 0.40 0.30 - 0.70 IU/mL    Comment:        IF HEPARIN RESULTS ARE BELOW EXPECTED VALUES, AND PATIENT DOSAGE HAS BEEN CONFIRMED, SUGGEST FOLLOW UP TESTING OF ANTITHROMBIN III LEVELS.   Protime-INR     Status: Abnormal   Collection Time: 03/16/14  6:51 AM  Result Value Ref Range   Prothrombin Time 15.3 (H) 11.6 - 15.2 seconds   INR 1.19 0.00 - 1.49     HEENT: tongue midline, no obvous neck swelling Cardio: RRR and no murmur Resp: CTA B/L and unlabored GI: BS positive and NT< ND Extremity:  Edema LUE> LLE Skin:   Bruise multiple ecchymotic areas BUE Neuro: alert, Cranial Nerve Abnormalities RIght central 7, Abnormal  Sensory cannot assess due to aphasia, Abnormal Motor 0/5 RUE, 0/5  RLE, antigravity on Left but has receptive difficulties and apraxia limiting MMT, Dysarthric, Aphasic and Apraxic Musc/Skel:  Other No lknee pain or effusion noted tolerates palpation Gen NAD,    Assessment/Plan: 1. Functional deficits secondary to Large left MCA infarct embolic which require 3+ hours per day of interdisciplinary therapy in a comprehensive inpatient rehab setting. Physiatrist is providing close team supervision and 24 hour management of active medical problems listed below. Physiatrist and rehab team continue to assess barriers to discharge/monitor patient progress toward functional and medical goals. Neuro to review CT and determine if Pt can be switched from IV heparin to Pradaxa FIM: FIM - Bathing Bathing: 1: Two helpers  FIM - Upper Body Dressing/Undressing Upper body dressing/undressing: 1: Total-Patient completed less than 25% of tasks FIM - Lower Body Dressing/Undressing Lower body dressing/undressing:  1: Two helpers  FIM - Toileting Toileting: 0: Activity did not occur     FIM - Control and instrumentation engineer Devices: Bed rails, HOB elevated, Arm rests Bed/Chair Transfer: 1: Two helpers, 2: Supine > Sit: Max A (lifting assist/Pt. 25-49%), 1: Sit > Supine: Total A (helper does all/Pt. < 25%)  FIM - Locomotion: Wheelchair Locomotion: Wheelchair: 1: Total Assistance/staff pushes wheelchair (Pt<25%) FIM - Locomotion: Ambulation Locomotion: Ambulation: 0: Activity did not occur (Unsafe at this time)  Comprehension Comprehension Mode: Auditory Comprehension: 1-Understands basic less than 25% of the time/requires cueing 75% of the time  Expression Expression Mode: Nonverbal Expression Assistive Devices:  (head nods ) Expression: 1-Expresses basis less than 25% of the time/requires cueing greater than 75% of the time.  Social Interaction Social Interaction: 2-Interacts  appropriately 25 - 49% of time - Needs frequent redirection.  Problem Solving Problem Solving: 1-Solves basic less than 25% of the time - needs direction nearly all the time or does not effectively solve problems and may need a restraint for safety  Memory Memory Mode:  (UTA due to aphasia ) Memory: 1-Recognizes or recalls less than 25% of the time/requires cueing greater than 75% of the time  Medical Problem List and Plan: 1. Functional deficits secondary to Large L-MCA infarct with dense right hemiplegia, dysphagia and global aphasia.   2.  DVT Prophylaxis/Anticoagulation: Pharmaceutical: Heparin to coumadin 3. Pain Management: tramadol, tylenol, appropriate turning in bed, regular checks for comfort 4. Mood: LCSW to follow for evaluation and support.   5. Neuropsych: This patient is not capable of making decisions on her own behalf. 6. Skin/Wound Care:  Turn patient every 4 hours.  Rehab RN to monitor skin daily.  Maintain adequate nutrition and hydration status.   7. Fluids/Electrolytes/Nutrition:  Monitor I/O. IVF at nights to help maintain adequate hydration.   -encourage po, full supervision with feeding 8. Hypertension: normotensive at present 9.  Thyroid mass question carcinoma, further eval as pt recovers from severe CVA 10.  Hx bilateral knee OA, saw ortho as outpt, diclofenac gel LOS (Days) 3 A FACE TO FACE EVALUATION WAS PERFORMED  Aarion Metzgar T 03/16/2014, 9:43 AM

## 2014-03-16 NOTE — Progress Notes (Signed)
Speech Language Pathology Note  Patient Details  Name: Sandy Barrett MRN: [id number] Date of Birth: 05-28-1932 Today's Date: 03/16/2014  MBSS complete. Full report located under chart review in imaging section. Thank you     Sharece Fleischhacker, Selinda Orion 03/16/2014, 4:20 PM

## 2014-03-16 NOTE — Progress Notes (Signed)
Speech Language Pathology Daily Session Note  Patient Details  Name: Sandy Barrett MRN: [id number] Date of Birth: May 30, 1932  Today's Date: 03/16/2014 SLP Individual Time: 1435-1510 SLP Individual Time Calculation (min): 35 min  Short Term Goals: Week 1: SLP Short Term Goal 1 (Week 1): Pt will improve accuracy of yes/no responses to basic, biographical questions to >50% with max assist multimodal cuing.   SLP Short Term Goal 2 (Week 1): Pt will improve focused attention to therapist or during basic, familiar tasks for 15-30 seconds with max assist.  SLP Short Term Goal 3 (Week 1): Pt will complete verbal automatic sequences for 25% accuracy with max assist multimodal cues.   SLP Short Term Goal 4 (Week 1): Pt will follow 1 step commands during basic, familiar tasks for 50% accuracy with max assist multimodal cuing.   SLP Short Term Goal 5 (Week 1): Pt will scan to the right of midline during basic, familiar tasks over ~50% of observable opportunities with max multimodal cues.    Skilled Therapeutic Interventions:  Pt was seen for skilled ST targeting goals for dysphagia and ongoing family education.  Pt was received from PT session, seated upright in wheelchair, awake, lethargic, but indicated agreement to participate in therapy by appropriately nodding her head yes in response to questions.   SLP facilitated the session with skilled dysphagia education regarding the results of pt's MBS from this AM, including pt's risk of aspiration after the swallow given reflux of honey thick liquids.  Pt's daughter, [name] was also educated on how to thicken liquids and provided a handout to facilitate carryover in between therapy sessions.  Pt's daughter verbalized understanding of all education.  SLP also facilitated the session with presentations of pudding thick liquids with pt exhibiting excellent toleration; no wet gurgley vocal quality, coughing or throat clearing noted.  Continue per current plan of  care.    FIM:  Comprehension Comprehension Mode: Auditory Comprehension: 1-Understands basic less than 25% of the time/requires cueing 75% of the time Expression Expression Mode: Nonverbal Expression: 1-Expresses basis less than 25% of the time/requires cueing greater than 75% of the time. Social Interaction Social Interaction: 2-Interacts appropriately 25 - 49% of time - Needs frequent redirection. Problem Solving Problem Solving: 1-Solves basic less than 25% of the time - needs direction nearly all the time or does not effectively solve problems and may need a restraint for safety Memory Memory: 1-Recognizes or recalls less than 25% of the time/requires cueing greater than 75% of the time FIM - Eating Eating Activity: 4: Helper occasionally scoops food on utensil  Pain Pain Assessment Pain Assessment: Faces Faces Pain Scale: No hurt  Therapy/Group: Individual Therapy  Ripken Rekowski, Selinda Orion 03/16/2014, 3:58 PM

## 2014-03-17 ENCOUNTER — Inpatient Hospital Stay (HOSPITAL_COMMUNITY): Payer: Medicare Other | Admitting: Physical Therapy

## 2014-03-17 ENCOUNTER — Inpatient Hospital Stay (HOSPITAL_COMMUNITY): Payer: Medicare Other | Admitting: Speech Pathology

## 2014-03-17 ENCOUNTER — Encounter (HOSPITAL_COMMUNITY): Payer: Medicare Other | Admitting: Occupational Therapy

## 2014-03-17 LAB — CBC
HCT: 30.7 % — ABNORMAL LOW (ref 36.0–46.0)
Hemoglobin: 9.6 g/dL — ABNORMAL LOW (ref 12.0–15.0)
MCH: 30.2 pg (ref 26.0–34.0)
MCHC: 31.3 g/dL (ref 30.0–36.0)
MCV: 96.5 fL (ref 78.0–100.0)
Platelets: 235 10*3/uL (ref 150–400)
RBC: 3.18 MIL/uL — ABNORMAL LOW (ref 3.87–5.11)
RDW: 19.3 % — ABNORMAL HIGH (ref 11.5–15.5)
WBC: 9.5 10*3/uL (ref 4.0–10.5)

## 2014-03-17 LAB — HEPARIN LEVEL (UNFRACTIONATED): Heparin Unfractionated: 0.23 IU/mL — ABNORMAL LOW (ref 0.30–0.70)

## 2014-03-17 LAB — PROTIME-INR
INR: 1.2 (ref 0.00–1.49)
Prothrombin Time: 15.3 seconds — ABNORMAL HIGH (ref 11.6–15.2)

## 2014-03-17 MED ORDER — WARFARIN SODIUM 5 MG PO TABS
5.0000 mg | ORAL_TABLET | Freq: Once | ORAL | Status: AC
  Administered 2014-03-17: 5 mg via ORAL
  Filled 2014-03-17: qty 1

## 2014-03-17 MED ORDER — ENOXAPARIN SODIUM 80 MG/0.8ML ~~LOC~~ SOLN
80.0000 mg | Freq: Two times a day (BID) | SUBCUTANEOUS | Status: DC
  Administered 2014-03-17 – 2014-03-22 (×10): 80 mg via SUBCUTANEOUS
  Filled 2014-03-17 (×12): qty 0.8

## 2014-03-17 NOTE — Progress Notes (Signed)
ANTICOAGULATION CONSULT NOTE - Follow Up Consult  Pharmacy Consult for Heparin  Indication: pulmonary embolus and DVT with CVA  No Known Allergies  Patient Measurements: Height: 5\' 5"  (165.1 cm) IBW/kg (Calculated) : 57  Vital Signs: Temp: 98.8 F (37.1 C) (03/12 0545) Temp Source: Oral (03/12 0545) BP: 113/61 mmHg (03/12 0545) Pulse Rate: 91 (03/12 0545)  Labs:  Recent Labs  03/15/14 1215  03/15/14 1225 03/16/14 0651 03/17/14 0720  HGB  --   < > 9.8* 9.4* 9.6*  HCT  --   --  31.7* 29.5* 30.7*  PLT  --   --  563* 444* 235  LABPROT  --   --   --  15.3* 15.3*  INR  --   --   --  1.19 1.20  HEPARINUNFRC 0.49  --   --  0.40 0.23*  < > = values in this interval not displayed.  Estimated Creatinine Clearance: 59.4 mL/min (by C-G formula based on Cr of 0.46).   Assessment: 81 yo female with CVA and also PE and DVT continues on heparin. Heparin level is now slightly subtherapeutic at 0.23. INR is also subtherapeutic at 1.2. H/H is low but stable. FOB was positive yesterday. MD is aware and planning to continue anticoagulation for now with plans to further investigate.   Goal of Therapy:  Heparin level 0.3-0.5 units/ml Monitor platelets by anticoagulation protocol: Yes   Plan:  - Repeat Coumadin 5 mg po x 1 - Daily PT/INR - Increase heparin gtt to 1350 units/hr (previously therapeutic on this rate) - Check an 8 hour heparin level - Continue daily heparin level and CBC  Salome Arnt, PharmD, BCPS Pager # [phone] 03/17/2014 9:40 AM

## 2014-03-17 NOTE — Progress Notes (Signed)
ANTICOAGULATION CONSULT NOTE - Follow Up Consult  Pharmacy Consult for Lovenox Indication: pulmonary embolus and DVT with acute stroke  No Known Allergies  Patient Measurements: Height: 5\' 5"  (165.1 cm) IBW/kg (Calculated) : 57  Vital Signs: Temp: 98.5 F (36.9 C) (03/12 1513) Temp Source: Oral (03/12 1513) BP: 117/48 mmHg (03/12 1513) Pulse Rate: 89 (03/12 1513)  Labs:  Recent Labs  03/15/14 1215  03/15/14 1225 03/16/14 0651 03/17/14 0720  HGB  --   < > 9.8* 9.4* 9.6*  HCT  --   --  31.7* 29.5* 30.7*  PLT  --   --  563* 444* 235  LABPROT  --   --   --  15.3* 15.3*  INR  --   --   --  1.19 1.20  HEPARINUNFRC 0.49  --   --  0.40 0.23*  < > = values in this interval not displayed.  Estimated Creatinine Clearance: 59.4 mL/min (by C-G formula based on Cr of 0.46).   Assessment: 81 y/o female with recent stroke and also PE and DVT who was on IV heparin. Patient is a very difficult stick and had 3 unsuccessful attempts to obtain a heparin level. Discussed case with Dr. Nicole Kindred from Neurology and he was ok with starting Lovenox in this patient. Also discussed case with Dr. Asa Lente. No bleeding noted, CBC is stable.  Goal of Therapy:  Anti-Xa level 0.6-1 units/ml 4hrs after LMWH dose given Monitor platelets by anticoagulation protocol: Yes   Plan:  - Discontinue IV heparin - Begin Lovenox 80 mg SQ q12h one hr after IV heparin turned off - discussed this with RN - CBC q72h while on Lovenox - Monitor for s/sx of bleeding  [facility], Pharm.D., BCPS Clinical Pharmacist Pager: [phone] 03/17/2014 8:20 PM

## 2014-03-17 NOTE — Progress Notes (Signed)
Called Dr. Burnice Logan and provided results of the EKG, showing SR with occasional PVC, no new orders at this time.

## 2014-03-17 NOTE — Progress Notes (Signed)
Occupational Therapy Session Note  Patient Details  Name: Sandy Barrett MRN: [id number] Date of Birth: 02-04-1932  Today's Date: 03/17/2014 OT Individual Time: 1115-1200 OT Individual Time Calculation (min): 45 min    Short Term Goals: Week 1:  OT Short Term Goal 1 (Week 1): Pt will complete bathing with mod assist and mod cues for initiation OT Short Term Goal 2 (Week 1): Pt will complete UB dressing with mod assist and mod cues for initiation OT Short Term Goal 3 (Week 1): Pt will complete LB dressing with max assist of one caregiver with mod cues for initiation OT Short Term Goal 4 (Week 1): Pt will complete toilet transfer with max assist of one caregiver   Skilled Therapeutic Interventions/Progress Updates:  Upon arrival to room pts nursing completing EKG; therefore time missed. Pt requires tactile and verbal cues for bed mobility for preparation for ADL care and mobility for care. Hand over hand assistance to wash face; UE care. Total a for LE care and brief management x 2 persons. Xfer hoyer lift x 2. Attempted grooming with hand over hand assistance with total a required.  Pt with noted edema in RUE arm left elevated in wc on arm table.      Therapy Documentation Precautions:  Precautions Precautions: Fall Precaution Comments: [name] Restrictions Weight Bearing Restrictions: No Other Position/Activity Restrictions: R shoulder with sublux General: General OT Amount of Missed Time: 15 Minutes    ADLs:   See FIM for current functional status  Therapy/Group: Individual Therapy  Elmyra Ricks S 03/17/2014, 2:19 PM

## 2014-03-17 NOTE — Progress Notes (Signed)
Patient ID: Sandy Barrett, female   DOB: 01-Jun-1932, 81 y.o.   MRN: [id number]  03/17/14.  81 year old patient admitted for CIR with functional deficits secondary to Large L-MCA infarct with dense right hemiplegia, dysphagia and global aphasia.    BLE dopplers revealed DVT R-distal CFV, FV, popliteal vein and DVT L-FV and popliteal vein.  2D echo revealed EF 65-70% with interatrial septal aneurysm.  Patient started on IV heparin for treatment on 03/04 and tolerated extubated without difficulty. Protein C, Protein S and lupus anticoagulant negative. Surgery consult recommended for work up once patient stable and question of thyroid malignancy. Dr. Leonie Man recommends starting oral anticoagulant one week for stroke due to paradoxical embolic.   Subjective/Complaints: Pt severely aphasic.nods head "yes" Eating better with encouragement.  Still receives supplemental IV fluids ROS: cannot obtain due to aphasia Objective: Vital Signs: Blood pressure 113/61, pulse 91, temperature 98.8 F (37.1 C), temperature source Oral, resp. rate 16, height 5\' 5"  (1.651 m), SpO2 97 %. Dg Swallowing Func-speech Pathology  03/16/2014    Objective Swallowing Evaluation:    Patient Details  Name: Sandy Barrett MRN: [id number] Date of Birth: November 12, 1932  Today's Date: 03/16/2014 Time: SLP Start Time: 0920-SLP Stop Time: 0950  SLP Time Calculation (min) (ACUTE ONLY): 30 min  Past Medical History:  Past Medical History  Diagnosis Date  . HYPERTENSION   . HYPERTHYROIDISM   . OBESITY   . ARTHRITIS   . Posttraumatic stress disorder   . INSOMNIA   . MIGRAINE HEADACHE   . Hyperthyroidism     s/p I-131 ablation 03/2011 of multinod goiter  . Arthritis    Past Surgical History:  Past Surgical History  Procedure Laterality Date  . Abdominal hysterectomy  1976  . Total hip arthroplasty  1998     right  . Tonsillectomy    . Breast surgery      biopsy  . Radiology with anesthesia Left 03/08/2014    Procedure: RADIOLOGY WITH ANESTHESIA;  Surgeon:  Rob Hickman, MD;   Location: [facility];  Service: Radiology;  Laterality: Left;   HPI:  HPI: Extubated 3/4. 81 year of female admitted with right sided weakness,  facial droop, and difficulty speaking. CT showed a dense left MCA CVA. CT  chest showed a large thyroid mass as well as bilateral pulmonary emboli.  F/u MRI with patchy areas of acute left MCA territory infarction, several  punctate foci of acute right MCA territory infarction, possible trace  subarachnoid hemorrhage in the high right frontal lobe.PMH of HTN,  migraine headache, postraumatic stress disorder.  Initial MBS completed on  03/10/2014 with recommendations that pt remain NPO due to oral holding as  well as motor planning and cognitive deficits.  Diet initiated 03/11/2014 at  bedside per improved automaticity and timeliness of swallow initiation.   Repeat MBS ordered today to objectively determine safest diet  consistencies given questionable diet toleration and persistent wet vocal  quality noted at bedside during meals.     Assessment / Plan / Recommendation CHL IP CLINICAL IMPRESSIONS 03/16/2014  Dysphagia Diagnosis Moderate pharyngeal phase dysphagia;Moderate oral  phase dysphagia  Clinical impression Pt presents with a moderate multifactorial dysphagia  with motor, sensory, and cognitive components.  Pt presents with slightly  prolonged oral transit of boluses due to cognitive and motor planning  deficits, anterior loss of boluses due to decreased labial seal, and mild  oral residuals left in the oral cavity post swallow.  Pt presented with  relatively good airway protection during  the swallow with honey thick  liquids via teaspoon and purees; however she was noted with reflux of  honey thick liquids through the UES and into the pyriforms, which then  spilled into the airway, resulting in deep, silent penetration of honey  thick liquids.  No reflux, aspiration, or penetration observed with  heavier boluses of purees.  Pt unable to clear throat  or cough on command  or use extra dry swallows to consistently clear pyriform residue prior to  entering the airway due to cognition, aphasia and apraxia.   Question the  impact of pt's recent diagnosis of thyroid mass with concomitant tracheal  displacement on esophageal function and contributing to observed reflux.   Pt may benefit from ENT consult or esophageal assessment. Recommend that  pt be conservatively downgraded to pudding thick liquids with continued  pureed textures due to lethargy,  decreased ability to follow commands for  use of swallowing precautions, and motor planning deficits.  Prognosis for  advancement good with improved alertness, ability to follow commands, and  trials of advanced consistencies with SLP.        CHL IP TREATMENT RECOMMENDATION 03/10/2014  Treatment Plan Recommendations Therapy as outlined in treatment plan below      CHL IP DIET RECOMMENDATION 03/16/2014  Diet Recommendations Dysphagia 1 (Puree);Pudding-thick liquid  Liquid Administration via Spoon  Medication Administration Crushed with puree  Compensations Slow rate;Small sips/bites  Postural Changes and/or Swallow Maneuvers Seated upright 90 degrees;Out of  bed for meals     CHL IP OTHER RECOMMENDATIONS 03/16/2014  Recommended Consults Consider ENT evaluation;Consider esophageal  assessment  Oral Care Recommendations Oral care BID  Other Recommendations (None)               SLP Swallow Goals     CHL IP REASON FOR REFERRAL 03/16/2014  Reason for Referral Objectively evaluate swallowing function     CHL IP ORAL PHASE 03/16/2014                             Oral - Honey Teaspoon Weak lingual manipulation;Delayed oral  transit;Reduced posterior propulsion                                   Oral - Puree Weak lingual manipulation;Reduced posterior  propulsion;Delayed oral transit                     CHL IP PHARYNGEAL PHASE 03/16/2014  Pharyngeal Phase Impaired              Pharyngeal - Honey Teaspoon Reduced tongue base   retraction;Penetration/Aspiration after swallow;Pharyngeal residue -  pyriform sinuses  Penetration/Aspiration details (honey teaspoon) Material enters airway,  CONTACTS cords and not ejected out                                                                    Pharyngeal - Puree WFL                             Pharyngeal Comment (None)     CHL IP CERVICAL ESOPHAGEAL PHASE  03/16/2014  Cervical Esophageal Phase Impaired                                         Cervical Esophageal Comment reflux of honey thick liquids back to the  pyriforms which was silently penetrated to the level of the cords.             Page, Selinda Orion 03/16/2014, 3:39 PM    Results for orders placed or performed during the hospital encounter of 03/13/14 (from the past 72 hour(s))  Vitamin B12     Status: None   Collection Time: 03/14/14 10:52 AM  Result Value Ref Range   Vitamin B-12 409 211 - 911 pg/mL    Comment: Performed at [facility]  Folate     Status: None   Collection Time: 03/14/14 10:52 AM  Result Value Ref Range   Folate 18.7 ng/mL    Comment: (NOTE) Reference Ranges        Deficient:       0.4 - 3.3 ng/mL        Indeterminate:   3.4 - 5.4 ng/mL        Normal:              > 5.4 ng/mL Performed at [facility]   Iron and TIBC     Status: Abnormal   Collection Time: 03/14/14 10:52 AM  Result Value Ref Range   Iron 39 (L) 42 - 145 ug/dL   TIBC 218 (L) 250 - 470 ug/dL   Saturation Ratios 18 (L) 20 - 55 %   UIBC 179 125 - 400 ug/dL    Comment: Performed at [facility]  Ferritin     Status: None   Collection Time: 03/14/14 10:52 AM  Result Value Ref Range   Ferritin 248 10 - 291 ng/mL    Comment: Performed at [facility]  Reticulocytes     Status: Abnormal   Collection Time: 03/14/14 10:52 AM  Result Value Ref Range   Retic Ct Pct 5.9 (H) 0.4 - 3.1 %   RBC. 3.41 (L) 3.87 - 5.11 MIL/uL   Retic Count, Manual 201.2 (H) 19.0 - 186.0 K/uL  Heparin level (unfractionated)      Status: None   Collection Time: 03/15/14 12:15 PM  Result Value Ref Range   Heparin Unfractionated 0.49 0.30 - 0.70 IU/mL    Comment:        IF HEPARIN RESULTS ARE BELOW EXPECTED VALUES, AND PATIENT DOSAGE HAS BEEN CONFIRMED, SUGGEST FOLLOW UP TESTING OF ANTITHROMBIN III LEVELS.   CBC     Status: Abnormal   Collection Time: 03/15/14 12:25 PM  Result Value Ref Range   WBC 9.3 4.0 - 10.5 K/uL   RBC 3.32 (L) 3.87 - 5.11 MIL/uL   Hemoglobin 9.8 (L) 12.0 - 15.0 g/dL   HCT 31.7 (L) 36.0 - 46.0 %   MCV 95.5 78.0 - 100.0 fL   MCH 29.5 26.0 - 34.0 pg   MCHC 30.9 30.0 - 36.0 g/dL   RDW 18.7 (H) 11.5 - 15.5 %   Platelets 563 (H) 150 - 400 K/uL  Occult blood card to lab, stool RN will collect     Status: None   Collection Time: 03/15/14  7:03 PM  Result Value Ref Range   Fecal Occult Bld NEGATIVE NEGATIVE  CBC     Status:  Abnormal   Collection Time: 03/16/14  6:51 AM  Result Value Ref Range   WBC 8.6 4.0 - 10.5 K/uL   RBC 3.13 (L) 3.87 - 5.11 MIL/uL   Hemoglobin 9.4 (L) 12.0 - 15.0 g/dL   HCT 29.5 (L) 36.0 - 46.0 %   MCV 94.2 78.0 - 100.0 fL   MCH 30.0 26.0 - 34.0 pg   MCHC 31.9 30.0 - 36.0 g/dL   RDW 19.0 (H) 11.5 - 15.5 %   Platelets 444 (H) 150 - 400 K/uL  Heparin level (unfractionated)     Status: None   Collection Time: 03/16/14  6:51 AM  Result Value Ref Range   Heparin Unfractionated 0.40 0.30 - 0.70 IU/mL    Comment:        IF HEPARIN RESULTS ARE BELOW EXPECTED VALUES, AND PATIENT DOSAGE HAS BEEN CONFIRMED, SUGGEST FOLLOW UP TESTING OF ANTITHROMBIN III LEVELS.   Protime-INR     Status: Abnormal   Collection Time: 03/16/14  6:51 AM  Result Value Ref Range   Prothrombin Time 15.3 (H) 11.6 - 15.2 seconds   INR 1.19 0.00 - 1.49  Occult blood card to lab, stool RN will collect     Status: Abnormal   Collection Time: 03/16/14  1:21 PM  Result Value Ref Range   Fecal Occult Bld POSITIVE (A) NEGATIVE  CBC     Status: Abnormal   Collection Time: 03/17/14  7:20 AM   Result Value Ref Range   WBC 9.5 4.0 - 10.5 K/uL   RBC 3.18 (L) 3.87 - 5.11 MIL/uL   Hemoglobin 9.6 (L) 12.0 - 15.0 g/dL   HCT 30.7 (L) 36.0 - 46.0 %   MCV 96.5 78.0 - 100.0 fL   MCH 30.2 26.0 - 34.0 pg   MCHC 31.3 30.0 - 36.0 g/dL   RDW 19.3 (H) 11.5 - 15.5 %   Platelets 235 150 - 400 K/uL    Comment: REPEATED TO VERIFY SPECIMEN CHECKED FOR CLOTS   Heparin level (unfractionated)     Status: Abnormal   Collection Time: 03/17/14  7:20 AM  Result Value Ref Range   Heparin Unfractionated 0.23 (L) 0.30 - 0.70 IU/mL    Comment:        IF HEPARIN RESULTS ARE BELOW EXPECTED VALUES, AND PATIENT DOSAGE HAS BEEN CONFIRMED, SUGGEST FOLLOW UP TESTING OF ANTITHROMBIN III LEVELS.   Protime-INR     Status: Abnormal   Collection Time: 03/17/14  7:20 AM  Result Value Ref Range   Prothrombin Time 15.3 (H) 11.6 - 15.2 seconds   INR 1.20 0.00 - 1.49     HEENT: tongue midline, no obvous neck swelling Cardio: RRR and grade 9-5/3 systolic murmur; heart rate, irregular Resp: CTA B/L and unlabored GI: BS positive and NT< ND Extremity:  Edema LUE> LLE; edema right hand Skin:   Bruise multiple ecchymotic areas BUE Neuro: alert, Cranial Nerve Abnormalities RIght central 7, Abnormal Sensory cannot assess due to aphasia, Abnormal Motor 0/5 RUE, 0/5  RLE, antigravity on Left but has receptive difficulties and apraxia limiting MMT, Dysarthric, Aphasic and Apraxic Musc/Skel:  Other No lknee pain or effusion noted tolerates palpation Gen NAD, remains severely a phasic   Assessment/Plan: 1. Functional deficits secondary to Large left MCA infarct embolic 2.  DVT Prophylaxis/Anticoagulation: Pharmaceutical: Heparin to coumadin 3. Pain Management: tramadol, tylenol, appropriate turning in bed, regular checks for comfort 4. Mood: LCSW to follow for evaluation and support.   5. Neuropsych: This patient is not capable of  making decisions on her own behalf. 6. Skin/Wound Care:  Turn patient every 4 hours.   Rehab RN to monitor skin daily.  Maintain adequate nutrition and hydration status.   7. Fluids/Electrolytes/Nutrition:  Monitor I/O. IVF at nights to help maintain adequate hydration.   -encourage po, full supervision with feeding 8. Hypertension: normotensive at present 9.  Thyroid mass question carcinoma, further eval as pt recovers from severe CVA 10.  Hx bilateral knee OA, saw ortho as outpt, diclofenac gel 11.  Irregular heart rate.  Will check EKG to confirm patient still in a normal sinus rhythm LOS (Days) 4 A FACE TO FACE EVALUATION WAS PERFORMED  Nyoka Cowden 03/17/2014, 9:40 AM

## 2014-03-17 NOTE — Progress Notes (Signed)
Physical Therapy Session Note  Patient Details  Name: Sandy Barrett MRN: [id number] Date of Birth: 05-25-1932  Today's Date: 03/17/2014 PT Individual Time: 1400-1500 PT Individual Time Calculation (min): 60 min   Short Term Goals: Week 1:  PT Short Term Goal 1 (Week 1): Pt will perform supine > sit with max A with HOB flat using rail with 75% cueing. PT Short Term Goal 2 (Week 1): Pt will perform sit > supine with HOB flat requiring max A and 75% cueing. PT Short Term Goal 3 (Week 1): Pt will consistently transfer from bed <> w/c with Max A of single therapist with 75% cueing. PT Short Term Goal 4 (Week 1):  Pt will perform w/c mobility x25' in controlled environment with Max A and 75% cueing for visual scanning/attention to R visual field. PT Short Term Goal 5 (Week 1): Pt will perform dynamic standing balance x3 consecutive minutes with LE support, without UE support requiring Mod A and 50% cueing.  Skilled Therapeutic Interventions/Progress Updates:  Pt was seen bedside in the pm sitting up in tilt in space w/c. Pt's BP 112/62, HR 90 and O2 sat 97%. Pt transported to rehab gym. Pt transferred w/c to edge of mat with sliding board max A + 1-2 for safety. Pt tolerated edge of mat about 30 minutes with fluctuating level of assistance from min guard to mod A. While sitting on edge of mat treatment focused on reaching, upright posture and weight shifting. Pt transferred sit to stand x 2 at edge of mat with max to total A. Pt transferred edge of mat to w/c with sliding board and max A + 1-2. Pt returned to room. Pt transferred to the R side w/c to edge of bed with max A and second person stand by for safety. Pt actually assisting to move L LE to assist with transfer. Pt transferred edge of bed to supine with total A. Pt moved up in bed with total A x 2.   Therapy Documentation Precautions:  Precautions Precautions: Fall Precaution Comments: [name] Restrictions Weight  Bearing Restrictions: No Other Position/Activity Restrictions: R shoulder with sublux General:   Pain: No signs of pain noted during treatment except R shoulder with weight bearing.  See FIM for current functional status  Therapy/Group: Individual Therapy  Dub Amis 03/17/2014, 3:32 PM

## 2014-03-17 NOTE — Progress Notes (Signed)
Speech Language Pathology Daily Session Note  Patient Details  Name: Sandy Barrett MRN: [id number] Date of Birth: [birth year]  Today's Date: 03/17/2014 SLP Individual Time: 1300-1400 SLP Individual Time Calculation (min): 60 min  Short Term Goals: Week 1: SLP Short Term Goal 1 (Week 1): Pt will improve accuracy of yes/no responses to basic, biographical questions to >50% with max assist multimodal cuing.   SLP Short Term Goal 2 (Week 1): Pt will improve focused attention to therapist or during basic, familiar tasks for 15-30 seconds with max assist.  SLP Short Term Goal 3 (Week 1): Pt will complete verbal automatic sequences for 25% accuracy with max assist multimodal cues.   SLP Short Term Goal 4 (Week 1): Pt will follow 1 step commands during basic, familiar tasks for 50% accuracy with max assist multimodal cuing.   SLP Short Term Goal 5 (Week 1): Pt will scan to the right of midline during basic, familiar tasks over ~50% of observable opportunities with max multimodal cues.    Skilled Therapeutic Interventions: Skilled treatment session focused on dysphagia and cognitive-linguistic goals. Upon arrival, patient was awake while sitting upright in the wheelchair with daughter present. SLP facilitated session by providing Max A multimodal cues for utilization of small bites, a slow rate of self-feeding and to clear right buccal pocketing with lunch meal of Dys. 1 textures and pudding thick liquids. Patient demonstrated a delayed cough X 1, suspect due to bolus size.  Patient nonverbal throughout session but demonstrated vocalizations, some of which that were perseverative in nature and without meaning.  Patient unable to identify an item within a field of two despite total A multimodal cues and required total A to match a picture to a written word from a field of two. Patient was also ~20% accurate for yes/no accuracy in regards to basic biographical questions.  Patient's daughter present and  educated on simple and functional tasks to facilitate functional communication, she verbalized understanding and asked appropriate questions.  Patient left upright in wheelchair with daughter present. Continue with current plan of care.    FIM:  Comprehension Comprehension Mode: Auditory Comprehension: 1-Understands basic less than 25% of the time/requires cueing 75% of the time Expression Expression Mode: Nonverbal Expression: 1-Expresses basis less than 25% of the time/requires cueing greater than 75% of the time. Social Interaction Social Interaction: 2-Interacts appropriately 25 - 49% of time - Needs frequent redirection. Problem Solving Problem Solving: 1-Solves basic less than 25% of the time - needs direction nearly all the time or does not effectively solve problems and may need a restraint for safety Memory Memory: 1-Recognizes or recalls less than 25% of the time/requires cueing greater than 75% of the time FIM - Eating Eating Activity: 4: Helper occasionally scoops food on utensil;5: Supervision/cues  Pain Pain Assessment Pain Assessment: No/denies pain  Therapy/Group: Individual Therapy  Sandy Barrett 03/17/2014, 3:10 PM

## 2014-03-18 ENCOUNTER — Inpatient Hospital Stay (HOSPITAL_COMMUNITY): Payer: Medicare Other | Admitting: Occupational Therapy

## 2014-03-18 LAB — OCCULT BLOOD X 1 CARD TO LAB, STOOL: Fecal Occult Bld: POSITIVE — AB

## 2014-03-18 MED ORDER — FUROSEMIDE 10 MG/ML IJ SOLN
40.0000 mg | Freq: Once | INTRAMUSCULAR | Status: AC
  Administered 2014-03-18: 40 mg via INTRAVENOUS
  Filled 2014-03-18: qty 4

## 2014-03-18 MED ORDER — WARFARIN SODIUM 5 MG PO TABS
5.0000 mg | ORAL_TABLET | Freq: Once | ORAL | Status: AC
  Administered 2014-03-18: 5 mg via ORAL
  Filled 2014-03-18: qty 1

## 2014-03-18 NOTE — Progress Notes (Signed)
Patient ID: [name], female   DOB: 03/10/1932, 81 y.o.   MRN: [id number]   Patient ID: [name], female   DOB: 08-Jan-1932, 81 y.o.   MRN: [id number]  03/18/14.  81 year old patient admitted for CIR with functional deficits secondary to Large L-MCA infarct with dense right hemiplegia, dysphagia and global aphasia.    BLE dopplers revealed DVT R-distal CFV, FV, popliteal vein and DVT L-FV and popliteal vein.  2D echo revealed EF 65-70% with interatrial septal aneurysm.  Patient started on IV heparin for treatment on 03/04 and tolerated extubated without difficulty. Protein C, Protein S and lupus anticoagulant negative. Surgery consult recommended for work up once patient stable and question of thyroid malignancy. Dr. Leonie Man recommends starting oral anticoagulant one week for stroke due to paradoxical embolic.   Patient switched to subcutaneous Lovenox yesterday due to poor peripheral IV access.  Patient has developed worsening edema especially involving upper extremities and lab draw has been unsuccessful yesterday and today.  By mouth intake remains marginal  Subjective/Complaints: Pt severely aphasic.nods head "yes" Eating better with encouragement.  Still receives supplemental IV fluids ROS: cannot obtain due to aphasia Objective: Vital Signs: Blood pressure 127/40, pulse 90, temperature 98.6 F (37 C), temperature source Oral, resp. rate 18, height 5\' 5"  (1.651 m), SpO2 92 %. Dg Swallowing Func-speech Pathology  03/16/2014    Objective Swallowing Evaluation:    Patient Details  Name: Liliauna Santoni MRN: [id number] Date of Birth: March 30, 1932  Today's Date: 03/16/2014 Time: SLP Start Time: 0920-SLP Stop Time: 0950  SLP Time Calculation (min) (ACUTE ONLY): 30 min  Past Medical History:  Past Medical History  Diagnosis Date  . HYPERTENSION   . HYPERTHYROIDISM   . OBESITY   . ARTHRITIS   . Posttraumatic stress disorder   . INSOMNIA   . MIGRAINE HEADACHE   . Hyperthyroidism     s/p I-131  ablation 03/2011 of multinod goiter  . Arthritis    Past Surgical History:  Past Surgical History  Procedure Laterality Date  . Abdominal hysterectomy  1976  . Total hip arthroplasty  1998     right  . Tonsillectomy    . Breast surgery      biopsy  . Radiology with anesthesia Left 03/08/2014    Procedure: RADIOLOGY WITH ANESTHESIA;  Surgeon: Rob Hickman, MD;   Location: [facility];  Service: Radiology;  Laterality: Left;   HPI:  HPI: Extubated 3/4. 4 year of female admitted with right sided weakness,  facial droop, and difficulty speaking. CT showed a dense left MCA CVA. CT  chest showed a large thyroid mass as well as bilateral pulmonary emboli.  F/u MRI with patchy areas of acute left MCA territory infarction, several  punctate foci of acute right MCA territory infarction, possible trace  subarachnoid hemorrhage in the high right frontal lobe.PMH of HTN,  migraine headache, postraumatic stress disorder.  Initial MBS completed on  03/10/2014 with recommendations that pt remain NPO due to oral holding as  well as motor planning and cognitive deficits.  Diet initiated 03/11/2014 at  bedside per improved automaticity and timeliness of swallow initiation.   Repeat MBS ordered today to objectively determine safest diet  consistencies given questionable diet toleration and persistent wet vocal  quality noted at bedside during meals.     Assessment / Plan / Recommendation CHL IP CLINICAL IMPRESSIONS 03/16/2014  Dysphagia Diagnosis Moderate pharyngeal phase dysphagia;Moderate oral  phase dysphagia  Clinical impression Pt presents with a moderate multifactorial dysphagia  with  motor, sensory, and cognitive components.  Pt presents with slightly  prolonged oral transit of boluses due to cognitive and motor planning  deficits, anterior loss of boluses due to decreased labial seal, and mild  oral residuals left in the oral cavity post swallow.  Pt presented with  relatively good airway protection during the swallow with honey  thick  liquids via teaspoon and purees; however she was noted with reflux of  honey thick liquids through the UES and into the pyriforms, which then  spilled into the airway, resulting in deep, silent penetration of honey  thick liquids.  No reflux, aspiration, or penetration observed with  heavier boluses of purees.  Pt unable to clear throat or cough on command  or use extra dry swallows to consistently clear pyriform residue prior to  entering the airway due to cognition, aphasia and apraxia.   Question the  impact of pt's recent diagnosis of thyroid mass with concomitant tracheal  displacement on esophageal function and contributing to observed reflux.   Pt may benefit from ENT consult or esophageal assessment. Recommend that  pt be conservatively downgraded to pudding thick liquids with continued  pureed textures due to lethargy,  decreased ability to follow commands for  use of swallowing precautions, and motor planning deficits.  Prognosis for  advancement good with improved alertness, ability to follow commands, and  trials of advanced consistencies with SLP.        CHL IP TREATMENT RECOMMENDATION 03/10/2014  Treatment Plan Recommendations Therapy as outlined in treatment plan below      CHL IP DIET RECOMMENDATION 03/16/2014  Diet Recommendations Dysphagia 1 (Puree);Pudding-thick liquid  Liquid Administration via Spoon  Medication Administration Crushed with puree  Compensations Slow rate;Small sips/bites  Postural Changes and/or Swallow Maneuvers Seated upright 90 degrees;Out of  bed for meals     CHL IP OTHER RECOMMENDATIONS 03/16/2014  Recommended Consults Consider ENT evaluation;Consider esophageal  assessment  Oral Care Recommendations Oral care BID  Other Recommendations (None)               SLP Swallow Goals     CHL IP REASON FOR REFERRAL 03/16/2014  Reason for Referral Objectively evaluate swallowing function     CHL IP ORAL PHASE 03/16/2014                             Oral - Honey Teaspoon Weak lingual  manipulation;Delayed oral  transit;Reduced posterior propulsion                                   Oral - Puree Weak lingual manipulation;Reduced posterior  propulsion;Delayed oral transit                     CHL IP PHARYNGEAL PHASE 03/16/2014  Pharyngeal Phase Impaired              Pharyngeal - Honey Teaspoon Reduced tongue base  retraction;Penetration/Aspiration after swallow;Pharyngeal residue -  pyriform sinuses  Penetration/Aspiration details (honey teaspoon) Material enters airway,  CONTACTS cords and not ejected out  Pharyngeal - Puree WFL                             Pharyngeal Comment (None)     CHL IP CERVICAL ESOPHAGEAL PHASE 03/16/2014  Cervical Esophageal Phase Impaired                                         Cervical Esophageal Comment reflux of honey thick liquids back to the  pyriforms which was silently penetrated to the level of the cords.             Page, Selinda Orion 03/16/2014, 3:39 PM    Results for orders placed or performed during the hospital encounter of 03/13/14 (from the past 72 hour(s))  Heparin level (unfractionated)     Status: None   Collection Time: 03/15/14 12:15 PM  Result Value Ref Range   Heparin Unfractionated 0.49 0.30 - 0.70 IU/mL    Comment:        IF HEPARIN RESULTS ARE BELOW EXPECTED VALUES, AND PATIENT DOSAGE HAS BEEN CONFIRMED, SUGGEST FOLLOW UP TESTING OF ANTITHROMBIN III LEVELS.   CBC     Status: Abnormal   Collection Time: 03/15/14 12:25 PM  Result Value Ref Range   WBC 9.3 4.0 - 10.5 K/uL   RBC 3.32 (L) 3.87 - 5.11 MIL/uL   Hemoglobin 9.8 (L) 12.0 - 15.0 g/dL   HCT 31.7 (L) 36.0 - 46.0 %   MCV 95.5 78.0 - 100.0 fL   MCH 29.5 26.0 - 34.0 pg   MCHC 30.9 30.0 - 36.0 g/dL   RDW 18.7 (H) 11.5 - 15.5 %   Platelets 563 (H) 150 - 400 K/uL  Occult blood card to lab, stool RN will collect     Status: None   Collection Time: 03/15/14  7:03 PM  Result Value Ref Range   Fecal Occult Bld NEGATIVE  NEGATIVE  CBC     Status: Abnormal   Collection Time: 03/16/14  6:51 AM  Result Value Ref Range   WBC 8.6 4.0 - 10.5 K/uL   RBC 3.13 (L) 3.87 - 5.11 MIL/uL   Hemoglobin 9.4 (L) 12.0 - 15.0 g/dL   HCT 29.5 (L) 36.0 - 46.0 %   MCV 94.2 78.0 - 100.0 fL   MCH 30.0 26.0 - 34.0 pg   MCHC 31.9 30.0 - 36.0 g/dL   RDW 19.0 (H) 11.5 - 15.5 %   Platelets 444 (H) 150 - 400 K/uL  Heparin level (unfractionated)     Status: None   Collection Time: 03/16/14  6:51 AM  Result Value Ref Range   Heparin Unfractionated 0.40 0.30 - 0.70 IU/mL    Comment:        IF HEPARIN RESULTS ARE BELOW EXPECTED VALUES, AND PATIENT DOSAGE HAS BEEN CONFIRMED, SUGGEST FOLLOW UP TESTING OF ANTITHROMBIN III LEVELS.   Protime-INR     Status: Abnormal   Collection Time: 03/16/14  6:51 AM  Result Value Ref Range   Prothrombin Time 15.3 (H) 11.6 - 15.2 seconds   INR 1.19 0.00 - 1.49  Occult blood card to lab, stool RN will collect     Status: Abnormal   Collection Time: 03/16/14  1:21 PM  Result Value Ref Range   Fecal Occult Bld POSITIVE (A) NEGATIVE  CBC     Status: Abnormal   Collection Time: 03/17/14  7:20 AM  Result Value Ref Range   WBC 9.5 4.0 - 10.5 K/uL   RBC 3.18 (L) 3.87 - 5.11 MIL/uL   Hemoglobin 9.6 (L) 12.0 - 15.0 g/dL   HCT 30.7 (L) 36.0 - 46.0 %   MCV 96.5 78.0 - 100.0 fL   MCH 30.2 26.0 - 34.0 pg   MCHC 31.3 30.0 - 36.0 g/dL   RDW 19.3 (H) 11.5 - 15.5 %   Platelets 235 150 - 400 K/uL    Comment: REPEATED TO VERIFY SPECIMEN CHECKED FOR CLOTS   Heparin level (unfractionated)     Status: Abnormal   Collection Time: 03/17/14  7:20 AM  Result Value Ref Range   Heparin Unfractionated 0.23 (L) 0.30 - 0.70 IU/mL    Comment:        IF HEPARIN RESULTS ARE BELOW EXPECTED VALUES, AND PATIENT DOSAGE HAS BEEN CONFIRMED, SUGGEST FOLLOW UP TESTING OF ANTITHROMBIN III LEVELS.   Protime-INR     Status: Abnormal   Collection Time: 03/17/14  7:20 AM  Result Value Ref Range   Prothrombin Time 15.3 (H)  11.6 - 15.2 seconds   INR 1.20 0.00 - 1.49      Intake/Output Summary (Last 24 hours) at 03/18/14 0853 Last data filed at 03/18/14 0800  Gross per 24 hour  Intake    460 ml  Output      0 ml  Net    460 ml    Patient Vitals for the past 24 hrs:  BP Temp Temp src Pulse Resp SpO2  03/18/14 0423 (!) 127/40 mmHg 98.6 F (37 C) Oral 90 18 92 %  03/17/14 1513 (!) 117/48 mmHg 98.5 F (36.9 C) Oral 89 17 96 %     HEENT: tongue midline, no obvous neck swelling Cardio: RRR and grade 9-6/2 systolic murmur; heart rate, irregular Resp: CTA B/L and unlabored GI: BS positive and NT< ND Extremity:  Worsening edema, especially upper extremities Skin:   Bruise multiple ecchymotic areas BUE Neuro: alert, Cranial Nerve Abnormalities RIght central 7, Abnormal Sensory cannot assess due to aphasia, Abnormal Motor 0/5 RUE, 0/5  RLE, antigravity on Left but has receptive difficulties and apraxia limiting MMT, Dysarthric, Aphasic and Apraxic Musc/Skel:  Other No lknee pain or effusion noted tolerates palpation Gen NAD, remains severely a phasic   Assessment/Plan: 1. Functional deficits secondary to Large left MCA infarct embolic 2.  DVT Prophylaxis/Anticoagulation: Pharmaceutical: Lovenox to coumadin 3. Pain Management: tramadol, tylenol, appropriate turning in bed, regular checks for comfort 4. Mood: LCSW to follow for evaluation and support.   5. Neuropsych: This patient is not capable of making decisions on her own behalf. 6. Skin/Wound Care:  Turn patient every 4 hours.  Rehab RN to monitor skin daily.  Maintain adequate nutrition and hydration status.   7. Fluids/Electrolytes/Nutrition:  Monitor I/O.  Will discontinue nighttime IV fluids at this time in view of worsening peripheral edema.   -encourage po, full supervision with feeding 8. Hypertension: normotensive at present 9.  Thyroid mass question carcinoma, further eval as pt recovers from severe CVA 10.  Hx bilateral knee OA, saw ortho as  outpt, diclofenac gel 11.  Irregular heart rate.  EKG obtained yesterday that confirmed normal sinus rhythm LOS (Days) 5 A FACE TO FACE EVALUATION WAS PERFORMED  Nyoka Cowden 03/18/2014, 8:47 AM

## 2014-03-18 NOTE — Progress Notes (Signed)
ANTICOAGULATION CONSULT NOTE - Follow Up Consult  Pharmacy Consult for warfarin Indication: pulmonary embolus and DVT with CVA  No Known Allergies  Patient Measurements: Height: 5\' 5"  (165.1 cm) IBW/kg (Calculated) : 57  Vital Signs: Temp: 98.6 F (37 C) (03/13 0423) Temp Source: Oral (03/13 0423) BP: 127/40 mmHg (03/13 0423) Pulse Rate: 90 (03/13 0423)  Labs:  Recent Labs  03/15/14 1215  03/15/14 1225 03/16/14 0651 03/17/14 0720  HGB  --   < > 9.8* 9.4* 9.6*  HCT  --   --  31.7* 29.5* 30.7*  PLT  --   --  563* 444* 235  LABPROT  --   --   --  15.3* 15.3*  INR  --   --   --  1.19 1.20  HEPARINUNFRC 0.49  --   --  0.40 0.23*  < > = values in this interval not displayed.  Estimated Creatinine Clearance: 59.4 mL/min (by C-G formula based on Cr of 0.46).   Assessment: 81 yo female with CVA and also PE and DVT continues on coumadin however, labs was unable to draw her INR this AM. INR is likely still subtherapeutic so probably ok to continue coumadin without an INR today. Will give coumadin tonight but if still unable to draw INR tomorrow, will need to re-evaluate her anticoagulation regimen.   Goal of Therapy:  INR 2-3   Plan:  - Repeat Coumadin 5 mg PO x 1 - Daily PT/INR  Salome Arnt, PharmD, BCPS Pager # [phone] 03/18/2014 9:51 AM

## 2014-03-18 NOTE — Progress Notes (Signed)
Unsuccessful venipuncture attempts made by three phlebotomists. Dr. Basilio Cairo notified. Orders given to discontinue Heparin drip and administer Lovenox 80mg  q12hrs. Patients family concerned about patients generalized edema. Patient receiving normal saline at 107ml/hr. 7P-7A. Adressed patients edema with Dr. Bayard Males.

## 2014-03-18 NOTE — Progress Notes (Signed)
Occupational Therapy Session Note  Patient Details  Name: Sandy Barrett MRN: [id number] Date of Birth: [birth year]  Today's Date: 03/18/2014 OT Individual Time: 1100-1200 OT Individual Time Calculation (min): 60 min    Short Term Goals: Week 1:  OT Short Term Goal 1 (Week 1): Pt will complete bathing with mod assist and mod cues for initiation OT Short Term Goal 2 (Week 1): Pt will complete UB dressing with mod assist and mod cues for initiation OT Short Term Goal 3 (Week 1): Pt will complete LB dressing with max assist of one caregiver with mod cues for initiation OT Short Term Goal 4 (Week 1): Pt will complete toilet transfer with max assist of one caregiver   Skilled Therapeutic Interventions/Progress Updates:  Upon entering the room, pt reclined in chair with daughter present in room. Her daughter reports pt attempting to get out of chair this morning so she reclined chair and pt stopped attempting. Skilled OT session with focus on self care retraining, initiation, and attention. Pt seated in recliner chair at sink and handed washcloth to wash face. Pt washed around mouth several times and handed cloth back to therapist. Pt began to perseverate on washing mouth and every time she was handed cloth she required hand over hand assist to was other body parts. Attempted multiple STS and STS with use of STEDY but pt resistive towards movement. Hoyer lift utilized to return patient to bed for [employer]. Pt having BM and did not alert therapist. Total A for hygiene and LB clothing management. Rolling L and R with total A. Max verbal, tactile, and demonstrational cues for tasks during session. Pt did initiate reaching L UE towards sleeve for UB dressing. After LB clothing donned, therapist returned pt back to recliner chair via hoyer lift. QRB donned, chair reclined, and call bell left within reach upon exiting the room.   Therapy Documentation Precautions:   Precautions Precautions: Fall Precaution Comments: [name] Restrictions Weight Bearing Restrictions: No Other Position/Activity Restrictions: R shoulder with sublux  Pain: Pain Assessment Pain Assessment: No/denies pain  See FIM for current functional status  Therapy/Group: Individual Therapy  Phineas Semen 03/18/2014, 12:28 PM

## 2014-03-18 NOTE — Progress Notes (Signed)
Patient's bilateral upper extremity very edematous, elevated on 2 pillows while sitting on the lounge chair comfortably. Daughters at the bedside. Notified MD with order for lasix IV. Will continue to monitor.

## 2014-03-19 ENCOUNTER — Inpatient Hospital Stay (HOSPITAL_COMMUNITY): Payer: Medicare Other | Admitting: Speech Pathology

## 2014-03-19 ENCOUNTER — Inpatient Hospital Stay (HOSPITAL_COMMUNITY): Payer: Medicare Other | Admitting: Physical Therapy

## 2014-03-19 ENCOUNTER — Inpatient Hospital Stay (HOSPITAL_COMMUNITY): Payer: Medicare Other | Admitting: Occupational Therapy

## 2014-03-19 DIAGNOSIS — I69921 Dysphasia following unspecified cerebrovascular disease: Secondary | ICD-10-CM

## 2014-03-19 DIAGNOSIS — R4702 Dysphasia: Secondary | ICD-10-CM

## 2014-03-19 LAB — CBC
HCT: 29.5 % — ABNORMAL LOW (ref 36.0–46.0)
Hemoglobin: 9.3 g/dL — ABNORMAL LOW (ref 12.0–15.0)
MCH: 29.9 pg (ref 26.0–34.0)
MCHC: 31.5 g/dL (ref 30.0–36.0)
MCV: 94.9 fL (ref 78.0–100.0)
Platelets: 168 10*3/uL (ref 150–400)
RBC: 3.11 MIL/uL — ABNORMAL LOW (ref 3.87–5.11)
RDW: 19.4 % — ABNORMAL HIGH (ref 11.5–15.5)
WBC: 7.7 10*3/uL (ref 4.0–10.5)

## 2014-03-19 LAB — BASIC METABOLIC PANEL
Anion gap: 8 (ref 5–15)
BUN: 7 mg/dL (ref 6–23)
CO2: 27 mmol/L (ref 19–32)
Calcium: 9.2 mg/dL (ref 8.4–10.5)
Chloride: 106 mmol/L (ref 96–112)
Creatinine, Ser: 0.49 mg/dL — ABNORMAL LOW (ref 0.50–1.10)
GFR calc Af Amer: >90 mL/min (ref >90)
GFR calc non Af Amer: >90 mL/min (ref >90)
Glucose, Bld: 103 mg/dL — ABNORMAL HIGH (ref 70–99)
Potassium: 3.6 mmol/L (ref 3.5–5.1)
Sodium: 141 mmol/L (ref 135–145)

## 2014-03-19 LAB — PROTIME-INR
INR: 1.51 — ABNORMAL HIGH (ref 0.00–1.49)
Prothrombin Time: 18.4 seconds — ABNORMAL HIGH (ref 11.6–15.2)

## 2014-03-19 LAB — OCCULT BLOOD X 1 CARD TO LAB, STOOL: Fecal Occult Bld: NEGATIVE

## 2014-03-19 MED ORDER — WARFARIN SODIUM 5 MG PO TABS
5.0000 mg | ORAL_TABLET | Freq: Once | ORAL | Status: AC
  Administered 2014-03-19: 5 mg via ORAL
  Filled 2014-03-19: qty 1

## 2014-03-19 NOTE — Progress Notes (Signed)
Discussed drop in platelets--current levels may be the norm with prior elevation due to stress reaction.  Heparin was changed to lovenox this weekend. Will check HIT panel. Patient has had heme positive stools as well as low BP this am. H/H relatively stable and no visible signs of bleeding/abdominal pain. Continue PPI. She did get dose of lasix last evening but will check lytes this am to monitor renal status as well as K+ levels as was downgraded to pudding liquids on Friday. IVF d/c yesterday.

## 2014-03-19 NOTE — Progress Notes (Signed)
BLE dopplers revealed DVT R-distal CFV, FV, popliteal vein and DVT L-FV and popliteal vein.  2D echo revealed EF 65-70% with interatrial septal aneurysm.  Patient started on IV heparin for treatment on 03/04 and tolerated extubated without difficulty. Protein C, Protein S and lupus anticoagulant negative. Surgery consult recommended for work up once patient stable and question of thyroid malignancy. Dr. Leonie Man recommends starting oral anticoagulant one week for stroke due to paradoxical embolic.   Subjective/Complaints: Pt severely aphasic.nods head "yes" Sitting in WC, smiling ROS: cannot obtain due to aphasia Objective: Vital Signs: Blood pressure 96/55, pulse 89, temperature 98.4 F (36.9 C), temperature source Oral, resp. rate 18, height 5\' 5"  (1.651 m), SpO2 91 %. No results found. Results for orders placed or performed during the hospital encounter of 03/13/14 (from the past 72 hour(s))  Occult blood card to lab, stool RN will collect     Status: Abnormal   Collection Time: 03/16/14  1:21 PM  Result Value Ref Range   Fecal Occult Bld POSITIVE (A) NEGATIVE  CBC     Status: Abnormal   Collection Time: 03/17/14  7:20 AM  Result Value Ref Range   WBC 9.5 4.0 - 10.5 K/uL   RBC 3.18 (L) 3.87 - 5.11 MIL/uL   Hemoglobin 9.6 (L) 12.0 - 15.0 g/dL   HCT 30.7 (L) 36.0 - 46.0 %   MCV 96.5 78.0 - 100.0 fL   MCH 30.2 26.0 - 34.0 pg   MCHC 31.3 30.0 - 36.0 g/dL   RDW 19.3 (H) 11.5 - 15.5 %   Platelets 235 150 - 400 K/uL    Comment: REPEATED TO VERIFY SPECIMEN CHECKED FOR CLOTS   Heparin level (unfractionated)     Status: Abnormal   Collection Time: 03/17/14  7:20 AM  Result Value Ref Range   Heparin Unfractionated 0.23 (L) 0.30 - 0.70 IU/mL    Comment:        IF HEPARIN RESULTS ARE BELOW EXPECTED VALUES, AND PATIENT DOSAGE HAS BEEN CONFIRMED, SUGGEST FOLLOW UP TESTING OF ANTITHROMBIN III LEVELS.   Protime-INR     Status: Abnormal   Collection Time: 03/17/14  7:20 AM  Result Value  Ref Range   Prothrombin Time 15.3 (H) 11.6 - 15.2 seconds   INR 1.20 0.00 - 1.49  Occult blood card to lab, stool RN will collect     Status: Abnormal   Collection Time: 03/18/14  2:10 PM  Result Value Ref Range   Fecal Occult Bld POSITIVE (A) NEGATIVE  Protime-INR     Status: Abnormal   Collection Time: 03/19/14  6:34 AM  Result Value Ref Range   Prothrombin Time 18.4 (H) 11.6 - 15.2 seconds   INR 1.51 (H) 0.00 - 1.49     HEENT: tongue midline, no obvous neck swelling Cardio: RRR and no murmur Resp: CTA B/L and unlabored GI: BS positive and NT< ND Extremity:  Edema LUE> LLE Skin:   Bruise multiple ecchymotic areas BUE Neuro: alert, Cranial Nerve Abnormalities RIght central 7, Abnormal Sensory cannot assess due to aphasia, Abnormal Motor 0/5 RUE, 0/5  RLE, antigravity on Left but has receptive difficulties and apraxia limiting MMT, Dysarthric, Aphasic and Apraxic Musc/Skel:  Other No lknee pain or effusion noted tolerates palpation Gen NAD,    Assessment/Plan: 1. Functional deficits secondary to Large left MCA infarct embolic which require 3+ hours per day of interdisciplinary therapy in a comprehensive inpatient rehab setting. Physiatrist is providing close team supervision and 24 hour management of active medical  problems listed below. Physiatrist and rehab team continue to assess barriers to discharge/monitor patient progress toward functional and medical goals. Discussed heme + stools with daughter and pt, monitor hgb in view of anticoag FIM: FIM - Bathing Bathing: 1: Two helpers  FIM - Upper Body Dressing/Undressing Upper body dressing/undressing: 1: Total-Patient completed less than 25% of tasks FIM - Lower Body Dressing/Undressing Lower body dressing/undressing: 1: Two helpers  FIM - Toileting Toileting: 1: Total-Patient completed zero steps, helper did all 3     FIM - Control and instrumentation engineer Devices: Bed rails, HOB elevated, Arm  rests Bed/Chair Transfer: 1: Mechanical lift, 1: Two helpers  FIM - Locomotion: Wheelchair Locomotion: Wheelchair: 1: Total Assistance/staff pushes wheelchair (Pt<25%) FIM - Locomotion: Ambulation Locomotion: Ambulation: 0: Activity did not occur  Comprehension Comprehension Mode: Auditory Comprehension: 1-Understands basic less than 25% of the time/requires cueing 75% of the time  Expression Expression Mode: Nonverbal Expression Assistive Devices:  (head nods) Expression: 1-Expresses basis less than 25% of the time/requires cueing greater than 75% of the time.  Social Interaction Social Interaction: 2-Interacts appropriately 25 - 49% of time - Needs frequent redirection.  Problem Solving Problem Solving: 1-Solves basic less than 25% of the time - needs direction nearly all the time or does not effectively solve problems and may need a restraint for safety  Memory Memory Mode: Not assessed (unable to assess because of aphasia) Memory: 1-Recognizes or recalls less than 25% of the time/requires cueing greater than 75% of the time  Medical Problem List and Plan: 1. Functional deficits secondary to Large L-MCA infarct with dense right hemiplegia, dysphagia and global aphasia.   2.  DVT Prophylaxis/Anticoagulation: Pharmaceutical: Heparin to coumadin 3. Pain Management: tramadol, tylenol, appropriate turning in bed, regular checks for comfort 4. Mood: LCSW to follow for evaluation and support.   5. Neuropsych: This patient is not capable of making decisions on her own behalf. 6. Skin/Wound Care:  Turn patient every 4 hours.  Rehab RN to monitor skin daily.  Maintain adequate nutrition and hydration status.   7. Fluids/Electrolytes/Nutrition:  Monitor I/O.Off IVF,per daughter eating better and taking thickened liquids -encourage po, full supervision with feeding 8. Hypertension: normotensive at present 9.  Thyroid mass question carcinoma, further eval as pt recovers from severe  CVA 10.  Hx bilateral knee OA, saw ortho as outpt, diclofenac gel LOS (Days) 6 A FACE TO FACE EVALUATION WAS PERFORMED  Caydence Enck E 03/19/2014, 8:33 AM

## 2014-03-19 NOTE — Progress Notes (Signed)
Incontinent of B & B this shift, requiring total assist for hygiene. Will take few spoonfuls of thickened fluids. Barrier cream applied to sacrum. Bilateral heels boggy and elevated off bed with pillows. BUE's swollen and elevated. Turning every 3 hours at HS. Demonstrated soft touch call light use with patient. Attempted to use I.S. With very little success. Bruising noted to legs, abd and arms, not new per daughter. Sandy Barrett A

## 2014-03-19 NOTE — Progress Notes (Addendum)
ANTICOAGULATION CONSULT NOTE - Follow Up Consult  Pharmacy Consult for coumadin and lovenox  Indication: DVT/PE  No Known Allergies  Patient Measurements: Height: 5\' 5"  (165.1 cm) IBW/kg (Calculated) : 57 Heparin Dosing Weight:   Vital Signs: Temp: 98.4 F (36.9 C) (03/14 0651) Temp Source: Oral (03/14 0651) BP: 96/55 mmHg (03/14 0651) Pulse Rate: 89 (03/14 0651)  Labs:  Recent Labs  03/17/14 0720 03/19/14 0634  HGB 9.6*  --   HCT 30.7*  --   PLT 235  --   LABPROT 15.3* 18.4*  INR 1.20 1.51*  HEPARINUNFRC 0.23*  --     Estimated Creatinine Clearance: 59.4 mL/min (by C-G formula based on Cr of 0.46).   Medications:  Scheduled:  . antiseptic oral rinse  7 mL Mouth Rinse BID  . chlorhexidine  15 mL Mouth Rinse BID  . diclofenac sodium  2 g Topical QID  . enoxaparin (LOVENOX) injection  80 mg Subcutaneous Q12H  . feeding supplement (ENSURE)  1 Container Oral TID BM  . iron polysaccharides  150 mg Oral BID AC  . pantoprazole sodium  40 mg Per Tube Q24H  . Warfarin - Pharmacist Dosing Inpatient   Does not apply q1800   Infusions:    Assessment: 81 yo female with DVT/PE is currently on subtherapeutic coumadin bridging with treatment dose of lovenox.  INR today is up to 1.51 from 1.2 few days ago.  Hgb stable at 9.3 but Plt continues to be down at 168 K  Goal of Therapy:  Anti-Xa level 0.6-1 units/ml 4hrs after LMWH dose given; INR 2-3 Monitor platelets by anticoagulation protocol: Yes   Plan:  - Repeat warfarin 5mg  PO x 1 tonight - if still unable to draw labs in AM, will need to re-evaluate anticoagulation choice - Continue lovenox 80mg  SQ Q12H - F/u CBC Q72H; HIT? Will discuss with team - Daily INR  Emilo Gras, Tsz-Yin 03/19/2014,8:25 AM

## 2014-03-19 NOTE — Progress Notes (Signed)
Recreational Therapy Assessment and Plan  Patient Details  Name: Sandy Barrett MRN: [id number] Date of Birth: 04/05/1932 Today's Date: 03/19/2014  Rehab Potential: Good ELOS: 4 weeks   Assessment Clinical Impression:  Problem List:  Patient Active Problem List   Diagnosis Date Noted  . Acute pulmonary embolism 03/14/2014  . DVT of lower extremity, bilateral 03/14/2014  . Global aphasia 03/14/2014  . 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 Dysphagia due to recent stroke 03/14/2014  . Aphasia due to stroke 03/13/2014  . Right hemiparesis 03/13/2014  . Embolic stroke   . Cerebral infarction due to embolism of left middle cerebral artery   . Cerebral infarction due to unspecified mechanism   . Respiratory failure   . Cerebral infarction due to thrombosis of right middle cerebral artery   . Stroke 03/08/2014  . Pulmonary embolism 03/08/2014  . CVA (cerebral infarction)   . Right sided weakness   . Back pain 08/29/2013  . Primary localized osteoarthrosis, lower leg 03/06/2013  . IBS (irritable bowel syndrome)   . 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 Multinodular goiter 01/13/2011  . Abnormal chest x-ray 12/12/2010  . HYPERTHYROIDISM 11/11/2009  . INSOMNIA 07/03/2009  . POSTTRAUMATIC STRESS DISORDER 08/22/2008  . OBESITY 04/21/2008  . OSTEOARTHRITIS 04/21/2008  . MIGRAINE HEADACHE 04/20/2008  . Essential hypertension 04/20/2008    Past Medical History:  Past Medical History  Diagnosis Date  . HYPERTENSION   . HYPERTHYROIDISM   . OBESITY   . ARTHRITIS   . Posttraumatic stress disorder   . INSOMNIA   . MIGRAINE HEADACHE   . Hyperthyroidism     s/p I-131 ablation 03/2011 of multinod goiter  . Arthritis    Past Surgical History:  Past Surgical History  Procedure Laterality Date  . Abdominal hysterectomy  1976  . Total hip  arthroplasty  1998     right  . Tonsillectomy    . Breast surgery      biopsy  . Radiology with anesthesia Left 03/08/2014    Procedure: RADIOLOGY WITH ANESTHESIA; Surgeon: Rob Hickman, MD; Location: [facility]; Service: Radiology; Laterality: Left;    Assessment & Plan Clinical Impression:Sandy Barrett is a 81 y.o. female with history of HTN, OA, Migraines, hyperlipidemia who was admitted on 03/08/14 with right sided weakness, difficulty talking and right facial droop. Patient had a hair appointment that day at 1300 hours and the hair dresser immediately noted she was confused and not talking correctly. CT head showed dense left MCA sign and Perfusion study showed a large area of penumbra involving the left MCA territory. Patient last seen normal at noon that day and tPA administered past CT chest negative for dissection. However incidental note of large thyroid mass displacing the trachea to the right, left internal jugular thormbus likely due to extrinsic compression by goiter, question right jugular thrombus and bilateral pulmonary emboli. Patient underwent cerebral angiogram with unsuccessful attempts at revascularization due to technical difficulties. MRI brain done revealing patchy areas of acute infarct L-MCA territory and severe punctate foci in R-MCA territory with possible trace SAH in high right frontal lobe. MRA brain with occluded L-ICA and L-MCA.   Carotid dopplers showed sluggish flow right jugular vein and left jugular vein thrombus--subacute and non-occlusive and suggestion of distal Left ICA occlusion. BLE dopplers revealed DVT R-distal CFV, FV, popliteal vein and DVT L-FV and popliteal vein. 2D echo revealed EF 65-70% with interatrial septal aneurysm. Patient started on IV heparin for treatment on 03/04 and tolerated extubated without difficulty.  Protein C, Protein S and lupus anticoagulant negative. Surgery consult recommended for work up once  patient stable and question of thyroid malignancy. Dr. Leonie Man recommends starting oral anticoagulant one week for stroke due r paradoxical embolic. To repeat CCT on 03/10 and if negative for bleed to start Pradaxa. Swallow evaluation with moderate to severe oral phase deficits and patient started on dysphagia 1, honey liquids by tsp. Patient with resultant, right sided weakness, right visual deficits with left gaze preference, severe global aphasia and ideation apraxia. Patient transferred to CIR on 03/13/2014 .      Pt presents with decreased activity tolerance, decreased functional mobility, decreased balance, motor apraxia, right inattention, decreased initiation, decreased attention, decreased awareness, decreased problem solving, decreased memory, global aphasia Limiting pt's independence with leisure/community pursuits.   Leisure History/Participation Premorbid leisure interest/current participation: Petra Kuba - Programmer, multimedia gardening Other Leisure Interests: Cooking/Baking Leisure Participation Style: With Family/Friends Psychosocial / Spiritual Spiritual Interests: Church;Womens'Men's Groups Patient agreeable to Pet Therapy: No Does patient have pets?: No Social interaction - Mood/Behavior: Cooperative Engineer, drilling for Education?: Yes Recreational Therapy Orientation Orientation -Reviewed with patient: Available activity resources Strengths/Weaknesses Patient Strengths/Abilities: Willingness to participate;Active premorbidly Patient weaknesses: Physical limitations TR Patient demonstrates impairments in the following area(s): Edema;Endurance;Motor;Pain;Safety;Skin Integrity  Plan Rec Therapy Plan Is patient appropriate for Therapeutic Recreation?: Yes Rehab Potential: Good Treatment times per week: Min 1 time per week >20 minutes Estimated Length of Stay: 4 weeks TR Treatment/Interventions: Adaptive equipment instruction;1:1 session;Balance/vestibular  training;Functional mobility training;Community reintegration;Cognitive remediation/compensation;Patient/family education;Therapeutic activities;Recreation/leisure participation;Therapeutic exercise;UE/LE Coordination activities;Visual/perceptual remediation/compensation;Wheelchair propulsion/positioning  Recommendations for other services: None  Discharge Criteria: Patient will be discharged from TR if patient refuses treatment 3 consecutive times without medical reason.  If treatment goals not met, if there is a change in medical status, if patient makes no progress towards goals or if patient is discharged from hospital.  The above assessment, treatment plan, treatment alternatives and goals were discussed and mutually agreed upon: by patient  [place] 03/19/2014, 4:25 PM

## 2014-03-19 NOTE — Progress Notes (Signed)
Occupational Therapy Session Note  Patient Details  Name: Sandy Barrett MRN: [id number] Date of Birth: [birth year]  Today's Date: 03/19/2014 OT Individual Time: 1100-1200 OT Individual Time Calculation (min): 60 min    Short Term Goals: Week 1:  OT Short Term Goal 1 (Week 1): Pt will complete bathing with mod assist and mod cues for initiation OT Short Term Goal 2 (Week 1): Pt will complete UB dressing with mod assist and mod cues for initiation OT Short Term Goal 3 (Week 1): Pt will complete LB dressing with max assist of one caregiver with mod cues for initiation OT Short Term Goal 4 (Week 1): Pt will complete toilet transfer with max assist of one caregiver   Skilled Therapeutic Interventions/Progress Updates:    Engaged in ADL retraining with focus on initiation and attention to Rt side of body.  UB bathing and dressing completed at sink with focus on initiation, requiring hand over hand assist to turn on water and initiate washing face.  Pt just nodding "yes" throughout session to any question or prompt.  When cued to wash chest, pt perseverating on washing face. Attempted combing hair with pt trying to bring comb to mouth and take a bite, requiring hand over hand assist to redirect.  Pt initiated pulling shirt over trunk to prepare for UB dressing, unaware of shirt still on Rt arm despite cues to scan to RUE.  Therapist thread BLE through pant legs, with pt unable to lift RLE, pt able to pull pants over knees after assisted to initiate task with hand over hand assist.  LB dressing completed with +2 for clothing management with therapist providing max-total assist for sit > stand and 2nd person pulled pants over hips.  Attempted to engage in reaching task and scanning to Rt of midline to locate items.  Pt with repetitive unintelligible verbalizations and unable to initiate reaching or scanning to Rt despite hand over hand assist.   Therapy Documentation Precautions:   Precautions Precautions: Fall Precaution Comments: [name] Restrictions Weight Bearing Restrictions: No Other Position/Activity Restrictions: R shoulder with sublux Pain:  Pt with no c/o pain  See FIM for current functional status  Therapy/Group: Individual Therapy  Simonne Come 03/19/2014, 12:12 PM

## 2014-03-19 NOTE — Plan of Care (Signed)
Problem: RH SKIN INTEGRITY Goal: RH STG MAINTAIN SKIN INTEGRITY WITH ASSISTANCE STG Maintain Skin Integrity With mod. Assistance.  Outcome: Not Progressing Total assist to maintain.

## 2014-03-19 NOTE — Progress Notes (Signed)
Rested quietly during night. Daughter at bedside. Sandy Barrett A

## 2014-03-19 NOTE — Progress Notes (Signed)
Speech Language Pathology Daily Session Note  Patient Details  Name: Sandy Barrett MRN: [id number] Date of Birth: [birth year]  Today's Date: 03/19/2014 SLP Individual Time: 0830-0930 SLP Individual Time Calculation (min): 60 min  Short Term Goals: Week 2:    Skilled Therapeutic Interventions: Skilled treatment session focused on addressing dysphagia and cognitive-linguistic goals. SLP facilitated session by providing hand-over-hand assist faded to Max assist multimodal cues for utilization of small bites, a slow rate of self-feeding and to clear right buccal pocketing with breakfast meal of Dys. 1 textures and pudding-thick liquids. Patient demonstrated no overt s/s of aspiration with PO intake and consumed about 50% of each item prior to indicating no with a head turn and lip purse.  SLP also facilitated session with a 2 choice sorting task.  Patient demonstrated vocalizations that were perseverative in nature and without meaning during this task and initially required Total, hand-over-hand assist to follow task rules for initiation, timely cessation and right attention.  Patient unable to sort items but did progress from Total assist to Max multimodal assist to pick up and put down item.  Continue with current plan of care.   FIM:  Comprehension Comprehension Mode: Auditory Comprehension: 1-Understands basic less than 25% of the time/requires cueing 75% of the time Expression Expression Mode: Verbal Expression: 1-Expresses basis less than 25% of the time/requires cueing greater than 75% of the time. Social Interaction Social Interaction: 2-Interacts appropriately 25 - 49% of time - Needs frequent redirection. Problem Solving Problem Solving: 1-Solves basic less than 25% of the time - needs direction nearly all the time or does not effectively solve problems and may need a restraint for safety Memory Memory: 1-Recognizes or recalls less than 25% of the time/requires cueing greater than  75% of the time FIM - Eating Eating Activity: 4: Helper occasionally scoops food on utensil;2: Hand over hand assist;4: Helper occasionally brings food to mouth;4: Helper checks for pocketed food;5: Set-up assist for open containers;5: Needs verbal cues/supervision  Pain Pain Assessment Pain Assessment: No/denies pain Faces Pain Scale: No hurt  Therapy/Group: Individual Therapy  Carmelia Roller., [place] [phone]  [place] 03/19/2014, 5:21 PM

## 2014-03-19 NOTE — Plan of Care (Signed)
Problem: RH BOWEL ELIMINATION Goal: RH STG MANAGE BOWEL WITH ASSISTANCE STG Manage Bowel with mod. Assistance.  Outcome: Not Progressing Incontinent.   Problem: RH BLADDER ELIMINATION Goal: RH STG MANAGE BLADDER WITH ASSISTANCE STG Manage Bladder With mod. Assistance  Outcome: Not Progressing incontinent

## 2014-03-19 NOTE — Progress Notes (Addendum)
Physical Therapy Session Note  Patient Details  Name: Sandy Barrett MRN: [id number] Date of Birth: 03-Dec-1932  Today's Date: 03/19/2014 PT Individual Time: 1345-1500 (Co-tx with rec therapist) PT Individual Time Calculation (min): 75 min   Short Term Goals: Week 1:  PT Short Term Goal 1 (Week 1): Pt will perform supine > sit with max A with HOB flat using rail with 75% cueing. PT Short Term Goal 2 (Week 1): Pt will perform sit > supine with HOB flat requiring max A and 75% cueing. PT Short Term Goal 3 (Week 1): Pt will consistently transfer from bed <> w/c with Max A of single therapist with 75% cueing. PT Short Term Goal 4 (Week 1):  Pt will perform w/c mobility x25' in controlled environment with Max A and 75% cueing for visual scanning/attention to R visual field. PT Short Term Goal 5 (Week 1): Pt will perform dynamic standing balance x3 consecutive minutes with LE support, without UE support requiring Mod A and 50% cueing.  Skilled Therapeutic Interventions/Progress Updates:    2:1. Co-treatment with rec therapist focusing on increasing pt attention to R of midline, dynamic sitting balance, and increasing activation in RUE/ELE. Pt received seated in tilt-in-space w/c accompanied by RN and daughter, Sandy Barrett. Transported pt to gym in w/c with total A secondary to pt inability to functionally self-propel tilt-in-space w/c. Pt performed level squat pivot transfer from w/c > mat table with +2A, max multimodal cueing for full anterior weight shift, initiation, hand placement. Pt required manual stabilization of RLE (due to RLE extensor tone which frequently occurs with transitional movements), manual positioning/stabilization of RUE.  Seated EOM with bilat LE support, pt initially required mod A to prevent posterior LOB; however, with visual feedback of mirror, pt able to self-correct posture with min cueing to utilize mirror. Noted pt with increased visual attention to R of midline without auditory  cueing during this session. See below for detailed description of NMR. Pt performed level lateral scooting transfer from mat table > w/c with slide board and +2A (pt performed ~25%) with cueing/manual stabilization as described above. Session ended in pt room, where pt was left reclined in tilt-in-space w/c with daughter present and all needs within reach.  Addendum: Pt performed multiple sit <> stand transfers from mat table then from standard chair with +2A, manual stabilization of RLE (to stabilize R knee, prevent R foot from moving with onset of RLE extensor tone), and manual stabilization of RUE to prevent shoulder subluxation. Pt performed static standing for up to 1.5 minutes with 2A, multimodal cueing for upright posture. Performed stand pivot transfers from mat table <> standard chair with +2Total A, manual stabilization of R knee, manual facilitation of lateral weight shifting.  Therapy Documentation Precautions:  Precautions Precautions: Fall Precaution Comments: [name] Restrictions Weight Bearing Restrictions: No Other Position/Activity Restrictions: R shoulder with sublux Pain: Pain Assessment Pain Assessment: Faces Faces Pain Scale: No hurt NMR: Neuromuscular Facilitation: Activity to increase anterior-posterior weight shifting;Activity to increase sustained activation;Activity to increase lateral weight shifting;Activity to increase coordination Seated EOM, pt performed LUE reaching across midline to receive horseshoe then reaching laterally to give horseshoe to therapist with multimodal cueing to release horseshoe from L hand; activity focused on visual attention, dynamic sitting balance. Seated EOM with bilat LE support and LUE support (manual stabilization of LUE), pt performed automatic LLE task (kicking ball) to facilitate increased weightbearing on R. Wedge placed under L hip was effective in increasing weight bearing on R hip,  promoting midline posture,  and addressing passive elongation of trunk on L.  See FIM for current functional status  Therapy/Group: Individual Therapy and Co-Treatment  Darshana Curnutt, [name] 03/19/2014, 3:37 PM

## 2014-03-20 ENCOUNTER — Inpatient Hospital Stay (HOSPITAL_COMMUNITY): Payer: Medicare Other | Admitting: Occupational Therapy

## 2014-03-20 ENCOUNTER — Inpatient Hospital Stay (HOSPITAL_COMMUNITY): Payer: Medicare Other | Admitting: Speech Pathology

## 2014-03-20 LAB — CBC
HCT: 31.3 % — ABNORMAL LOW (ref 36.0–46.0)
Hemoglobin: 9.6 g/dL — ABNORMAL LOW (ref 12.0–15.0)
MCH: 29.5 pg (ref 26.0–34.0)
MCHC: 30.7 g/dL (ref 30.0–36.0)
MCV: 96.3 fL (ref 78.0–100.0)
Platelets: 167 10*3/uL (ref 150–400)
RBC: 3.25 MIL/uL — ABNORMAL LOW (ref 3.87–5.11)
RDW: 19.2 % — ABNORMAL HIGH (ref 11.5–15.5)
WBC: 6.7 10*3/uL (ref 4.0–10.5)

## 2014-03-20 LAB — PROTIME-INR
INR: 1.67 — ABNORMAL HIGH (ref 0.00–1.49)
Prothrombin Time: 19.9 seconds — ABNORMAL HIGH (ref 11.6–15.2)

## 2014-03-20 LAB — OCCULT BLOOD X 1 CARD TO LAB, STOOL: Fecal Occult Bld: NEGATIVE

## 2014-03-20 MED ORDER — WARFARIN SODIUM 7.5 MG PO TABS
7.5000 mg | ORAL_TABLET | Freq: Once | ORAL | Status: AC
  Administered 2014-03-20: 7.5 mg via ORAL
  Filled 2014-03-20: qty 1

## 2014-03-20 NOTE — Progress Notes (Signed)
ANTICOAGULATION CONSULT NOTE - Follow Up Consult  Pharmacy Consult for lovenox and coumadin Indication: DVT/PE  No Known Allergies  Patient Measurements: Height: 5\' 5"  (165.1 cm) IBW/kg (Calculated) : 57 Heparin Dosing Weight:   Vital Signs: Temp: 98.7 F (37.1 C) (03/15 0622) Temp Source: Oral (03/15 0622) BP: 109/66 mmHg (03/15 0622) Pulse Rate: 87 (03/15 0622)  Labs:  Recent Labs  03/19/14 0634 03/19/14 1202 03/20/14 0725  HGB 9.3*  --  9.6*  HCT 29.5*  --  31.3*  PLT 168  --  167  LABPROT 18.4*  --  19.9*  INR 1.51*  --  1.67*  CREATININE  --  0.49*  --     Estimated Creatinine Clearance: 59.4 mL/min (by C-G formula based on Cr of 0.49).   Medications:  Scheduled:  . antiseptic oral rinse  7 mL Mouth Rinse BID  . chlorhexidine  15 mL Mouth Rinse BID  . diclofenac sodium  2 g Topical QID  . enoxaparin (LOVENOX) injection  80 mg Subcutaneous Q12H  . feeding supplement (ENSURE)  1 Container Oral TID BM  . iron polysaccharides  150 mg Oral BID AC  . pantoprazole sodium  40 mg Per Tube Q24H  . Warfarin - Pharmacist Dosing Inpatient   Does not apply q1800   Infusions:    Assessment: 81 yo female with DVT/PE is currently on subtherapeutic coumadin bridging with treatment dose of lovenox.  INR today is 1.67 from 1.51.  Hgb and Plt are stable at 9.6 and 167 K, respectively.  Goal of Therapy:  Anti-Xa level 0.6-1 units/ml 4hrs after LMWH dose given; INR 2-3 Monitor platelets by anticoagulation protocol: Yes   Plan:  - Coumadin 7.5 mg po x1 (may need 5 mg most days and 7.5 mg few days a week) - Continue lovenox 80mg  SQ Q12H - F/u CBC closely and HIT panel - Daily INR   Ariellah Faust, Tsz-Yin 03/20/2014,8:46 AM

## 2014-03-20 NOTE — Progress Notes (Signed)
Physical Therapy Session Note  Patient Details  Name: Sandy Barrett MRN: [id number] Date of Birth: July 06, 1932  Today's Date: 03/20/2014 PT Co-Treatment Time: 6213-0865 (Co-tx with [facility]; entire session from [phone]) PT Co-Treatment Time Calculation (min): 35 min  Short Term Goals: Week 1:  PT Short Term Goal 1 (Week 1): Pt will perform supine > sit with max A with HOB flat using rail with 75% cueing. PT Short Term Goal 2 (Week 1): Pt will perform sit > supine with HOB flat requiring max A and 75% cueing. PT Short Term Goal 3 (Week 1): Pt will consistently transfer from bed <> w/c with Max A of single therapist with 75% cueing. PT Short Term Goal 4 (Week 1):  Pt will perform w/c mobility x25' in controlled environment with Max A and 75% cueing for visual scanning/attention to R visual field. PT Short Term Goal 5 (Week 1): Pt will perform dynamic standing balance x3 consecutive minutes with LE support, without UE support requiring Mod A and 50% cueing.  Skilled Therapeutic Interventions/Progress Updates:    Skilled co-treatment with primary OT focusing on functional transfers, dynamic sitting balance, and increasing attention to R visual field/hemibody. Pt received semi reclined in bed accompanied by PA. Pt nodded head "yes" when asked to participate in therapy session. Pt performed supine > sit with HOB elevated with mod A. See OT note for detail regarding assist/cueing required with eating and self-care tasks. During dressing, pt performed multiple sit <> stand transfers from [facility] with +2A, manual stabilization of RLE (decreased tendency toward R knee buckling; however, manual assist required to maintain foot positioning on floor); multimodal cueing for upright posture (thoracic spine/hip extension, bilaterally). Pt able to maintain standing for >1.5 minutes with total A of bariatric Stedy and min A for stability/upright posture on R side. Pt performed stand pivot transfer from bed > w/c with  +2Total A; see OT note for assist/cueing provided; this PT positioned posterior to pt providing tactile cueing at bilat ribcage during sit > stand, manual facilitation of lateral weight shift during pivoting. Departed with pt reclined in tilt-in-space w/c with quick release belt in place for safety, daughter present, and all needs within reach.  Therapy Documentation Precautions:  Precautions Precautions: Fall Precaution Comments: [name] Restrictions Weight Bearing Restrictions: No Other Position/Activity Restrictions: R shoulder with sublux Pain: Pain Assessment Pain Assessment: Faces Faces Pain Scale: No hurt   See FIM for current functional status  Therapy/Group: Co-Treatment  [name], Blair A 03/20/2014, 12:41 PM

## 2014-03-20 NOTE — Progress Notes (Signed)
Speech Language Pathology Daily Session Note  Patient Details  Name: Sandy Barrett MRN: [id number] Date of Birth: [birth year]  Today's Date: 03/20/2014 SLP Individual Time: 1010-1110 SLP Individual Time Calculation (min): 60 min  Short Term Goals: Week 1: SLP Short Term Goal 1 (Week 1): Pt will improve accuracy of yes/no responses to basic, biographical questions to >50% with max assist multimodal cuing.   SLP Short Term Goal 2 (Week 1): Pt will improve focused attention to therapist or during basic, familiar tasks for 15-30 seconds with max assist.  SLP Short Term Goal 3 (Week 1): Pt will complete verbal automatic sequences for 25% accuracy with max assist multimodal cues.   SLP Short Term Goal 4 (Week 1): Pt will follow 1 step commands during basic, familiar tasks for 50% accuracy with max assist multimodal cuing.   SLP Short Term Goal 5 (Week 1): Pt will scan to the right of midline during basic, familiar tasks over ~50% of observable opportunities with max multimodal cues.    Skilled Therapeutic Interventions: Skilled treatment session focused on addressing dysphagia and cognitive-linguistic goals. SLP facilitated session with structured singing task, patient able to produce tune with SLP and achieved bilabial approximations for /m/ following Max assist multimodal cues from SLP.  Despite Max assist multimodal cues for other automatic tasks such as counting patient was unable to produce same number of syllables with SLP.  Following verbal tasks patient demonstrated vocalizations that were perseverative in nature and without meaning during receptive language tasks.  SLP attempted to facilitate object identification task from a field of two with Total, hand-over-hand assist.  However, due to decreased sustained attention, initiation and cessation deficits task downgraded to a basic task of initiating picking items and placing them with timely cessation with hand-over-hand assist faded to Max  assist model cues.   SLP also facilitated session by providing hand-over-hand assist faded to Max assist multimodal cues for utilization of small bites, a slow rate of self-feeding with Dys. 1 textures.  Patient demonstrated no overt s/s of aspiration.  Continue with current plan of care.   FIM:  Comprehension Comprehension Mode: Auditory Comprehension: 1-Understands basic less than 25% of the time/requires cueing 75% of the time Expression Expression Mode: Verbal Expression: 1-Expresses basis less than 25% of the time/requires cueing greater than 75% of the time. Social Interaction Social Interaction: 2-Interacts appropriately 25 - 49% of time - Needs frequent redirection. Problem Solving Problem Solving: 1-Solves basic less than 25% of the time - needs direction nearly all the time or does not effectively solve problems and may need a restraint for safety Memory Memory: 1-Recognizes or recalls less than 25% of the time/requires cueing greater than 75% of the time FIM - Eating Eating Activity: 4: Helper occasionally scoops food on utensil;2: Hand over hand assist;4: Helper occasionally brings food to mouth;4: Helper checks for pocketed food;5: Set-up assist for open containers;5: Needs verbal cues/supervision  Pain Pain Assessment Pain Assessment: No/denies pain  Therapy/Group: Individual Therapy  Carmelia Roller., [place] [phone]  [place] 03/20/2014, 4:11 PM

## 2014-03-20 NOTE — Progress Notes (Signed)
NUTRITION FOLLOW UP  INTERVENTION: Continue Ensure Pudding po TID, each supplement provides 170 kcal and 4 grams of protein.  Continue Magic cup TID with meals, each supplement provides 290 kcal and 9 grams of protein.  Encourage adequate PO intake.  NUTRITION DIAGNOSIS: Inadequate oral intake related to dislike of puree diet as evidenced by meal completion of 20-35%; ongoing  Goal: Pt to meet >/= 90% of their estimated nutrition needs; progressing  Monitor:  PO intake, weight trends, labs, I/O's  81 y.o. female  Admitting Dx: Cerebral infarction due to embolism of left middle cerebral artery  ASSESSMENT: Pt history of HTN, OA, Migraines, hyperlipidemia who was admitted on 03/08/14 with right sided weakness, difficulty talking and right facial droop. MRI brain done revealing patchy areas of acute infarct L-MCA territory and severe punctate foci in R-MCA territory with possible trace SAH in high right frontal lobe. MRA brain with occluded L-ICA and L-MCA.   Pt is on a dysphagia 1 diet with pudding thick liquids. Daughter present at pt bedside. Pt aphasic. Daughter reports pt has been mostly eating 50% of meals. Meal completion has varied from 10-100%. Pt has been consuming her Ensure pudding and magic cup. Daughter was educated to call the kitchen to provide pt with more variety of foods. Pt was encouraged to eat her food at meals and to consumer her supplements.   Labs and medications reviewed.  Height: Ht Readings from Last 1 Encounters:  03/14/14 5\' 5"  (1.651 m)    Weight: Wt Readings from Last 1 Encounters:  03/12/14 175 lb 7.8 oz (79.6 kg)    BMI:  Body Mass Index: 29.26 kg/(m^2)  Re-Estimated Nutritional Needs: Kcal: 1800-2000 Protein: 90-100 grams Fluid: 1.8 - 2 L/day  Skin: Incision on right groin, non-pitting edema  Diet Order: DIET - DYS 1 with pudding thickened liquids   Intake/Output Summary (Last 24 hours) at 03/20/14 1441 Last data filed at 03/20/14  1300  Gross per 24 hour  Intake    300 ml  Output      0 ml  Net    300 ml    Last BM: 3/14  Labs:   Recent Labs Lab 03/14/14 0423 03/19/14 1202  NA 142 141  K 3.5 3.6  CL 113* 106  CO2 26 27  BUN 6 7  CREATININE 0.46* 0.49*  CALCIUM 8.3* 9.2  GLUCOSE 101* 103*    CBG (last 3)  No results for input(s): GLUCAP in the last 72 hours.  Scheduled Meds: . antiseptic oral rinse  7 mL Mouth Rinse BID  . chlorhexidine  15 mL Mouth Rinse BID  . diclofenac sodium  2 g Topical QID  . enoxaparin (LOVENOX) injection  80 mg Subcutaneous Q12H  . feeding supplement (ENSURE)  1 Container Oral TID BM  . iron polysaccharides  150 mg Oral BID AC  . pantoprazole sodium  40 mg Per Tube Q24H  . warfarin  7.5 mg Oral ONCE-1800  . Warfarin - Pharmacist Dosing Inpatient   Does not apply q1800    Continuous Infusions:    Past Medical History  Diagnosis Date  . HYPERTENSION   . HYPERTHYROIDISM   . OBESITY   . ARTHRITIS   . Posttraumatic stress disorder   . INSOMNIA   . MIGRAINE HEADACHE   . Hyperthyroidism     s/p I-131 ablation 03/2011 of multinod goiter  . Arthritis     Past Surgical History  Procedure Laterality Date  . Abdominal hysterectomy  1976  .  Total hip arthroplasty  1998     right  . Tonsillectomy    . Breast surgery      biopsy  . Radiology with anesthesia Left 03/08/2014    Procedure: RADIOLOGY WITH ANESTHESIA;  Surgeon: Rob Hickman, MD;  Location: [facility];  Service: Radiology;  Laterality: Left;    Kallie Locks, MS, RD, LDN Pager # [phone] After hours/ weekend pager # [phone]

## 2014-03-20 NOTE — Progress Notes (Signed)
Occupational Therapy Session Note  Patient Details  Name: Azora Bonzo MRN: [id number] Date of Birth: Jul 27, 1932  Today's Date: 03/20/2014 OT Individual Time: 1300-1400 OT Individual Time Calculation (min): 60 min  and Today's Date: 03/20/2014 OT Co-Treatment Time: 0830-0900 (entire time 5170-0174) OT Co-Treatment Time Calculation (min): 30 min   Short Term Goals: Week 1:  OT Short Term Goal 1 (Week 1): Pt will complete bathing with mod assist and mod cues for initiation OT Short Term Goal 2 (Week 1): Pt will complete UB dressing with mod assist and mod cues for initiation OT Short Term Goal 3 (Week 1): Pt will complete LB dressing with max assist of one caregiver with mod cues for initiation OT Short Term Goal 4 (Week 1): Pt will complete toilet transfer with max assist of one caregiver   Skilled Therapeutic Interventions/Progress Updates:    1) Skilled co-treat with primary PT with focus on initiation, sequencing, Rt attention, sitting balance, sit > stand during functional task of self-feeding and dressing.  Engaged in self-feeding while seated at EOB with focus on sitting balance, Rt attention, and self-feeding.  Pt required max cues for portion control with self-feeding but was able to scoop food and bring to mouth without assist.  Pt demonstrating increased initiation during functional task of dressing with threading Lt arm of bra and shirt when presented with items, decreased attention to RUE or RLE during dressing tasks despite therapist positioning on Rt with verbal and tactile cues to locate RUE.  Sit > stand for peri care with +2 to assist with trunk control and weight shifting, use of STEDY to promote upright standing to allow for thorough cleaning and donning of brief.  Stand step transfer bed > w/c with +2 for safety.    2) Therapeutic activity with focus on spontaneous engagement in motivating tasks.  Attempted to engage pt in caring for plants with scanning to Rt and reaching  across midline to promote scanning to Rt.  Pt required increased wait time and hand over hand assist with plant activity.  Utilized various colored pictures with pt obtaining from stack on table to Rt of midline and handing them to therapist on pt's left.  Pt brightened at sight of picture with lipstick. Utilized pt's own lipstick to motivate pt to reach across midline to obtain with no success, when handed to pt she attempted to put it in her mouth.  Hand over hand and use of mirror to apply lipstick to lips with pt continuing to attempted to place it in her mouth.  Discussed more motivating things with daughter and plan to incorporate makeup and jewelry into activities as able.  Therapy Documentation Precautions:  Precautions Precautions: Fall Precaution Comments: [name] Restrictions Weight Bearing Restrictions: No Other Position/Activity Restrictions: R shoulder with sublux Pain:  Pt with no c/o pain  See FIM for current functional status  Therapy/Group: Individual Therapy and Co-Treatment  Kienna Moncada, [facility] 03/20/2014, 3:40 PM

## 2014-03-20 NOTE — Progress Notes (Signed)
BLE dopplers revealed DVT R-distal CFV, FV, popliteal vein and DVT L-FV and popliteal vein.  2D echo revealed EF 65-70% with interatrial septal aneurysm.  Patient started on IV heparin for treatment on 03/04 and tolerated extubated without difficulty. Protein C, Protein S and lupus anticoagulant negative. Surgery consult recommended for work up once patient stable and question of thyroid malignancy. Dr. Leonie Man recommends starting oral anticoagulant one week for stroke due to paradoxical embolic.   Subjective/Complaints: In bed somnolent, discussed fluid intake with daughter ROS: cannot obtain due to aphasia Objective: Vital Signs: Blood pressure 109/66, pulse 87, temperature 98.7 F (37.1 C), temperature source Oral, resp. rate 18, height _0  (1.651 m), SpO2 95 %. No results found. Results for orders placed or performed during the hospital encounter of 03/13/14 (from the past 72 hour(s))  Occult blood card to lab, stool RN will collect     Status: Abnormal   Collection Time: 03/18/14  2:10 PM  Result Value Ref Range   Fecal Occult Bld POSITIVE (A) NEGATIVE  CBC     Status: Abnormal   Collection Time: 03/19/14  6:34 AM  Result Value Ref Range   WBC 7.7 4.0 - 10.5 K/uL   RBC 3.11 (L) 3.87 - 5.11 MIL/uL   Hemoglobin 9.3 (L) 12.0 - 15.0 g/dL    Comment: REPEATED TO VERIFY   HCT 29.5 (L) 36.0 - 46.0 %   MCV 94.9 78.0 - 100.0 fL   MCH 29.9 26.0 - 34.0 pg   MCHC 31.5 30.0 - 36.0 g/dL   RDW 19.4 (H) 11.5 - 15.5 %   Platelets 168 150 - 400 K/uL    Comment: SPECIMEN CHECKED FOR CLOTS REPEATED TO VERIFY   Protime-INR     Status: Abnormal   Collection Time: 03/19/14  6:34 AM  Result Value Ref Range   Prothrombin Time 18.4 (H) 11.6 - 15.2 seconds   INR 1.51 (H) 0.00 - 3.74  Basic metabolic panel     Status: Abnormal   Collection Time: 03/19/14 12:02 PM  Result Value Ref Range   Sodium 141 135 - 145 mmol/L   Potassium 3.6 3.5 - 5.1 mmol/L   Chloride 106 96 - 112 mmol/L   CO2 27 19 - 32  mmol/L   Glucose, Bld 103 (H) 70 - 99 mg/dL   BUN 7 6 - 23 mg/dL   Creatinine, Ser 0.49 (L) 0.50 - 1.10 mg/dL   Calcium 9.2 8.4 - 10.5 mg/dL   GFR calc non Af Amer >90 >90 mL/min   GFR calc Af Amer >90 >90 mL/min    Comment: (NOTE) The eGFR has been calculated using the CKD EPI equation. This calculation has not been validated in all clinical situations. eGFR's persistently <90 mL/min signify possible Chronic Kidney Disease.    Anion gap 8 5 - 15  Occult blood card to lab, stool RN will collect     Status: None   Collection Time: 03/19/14  1:43 PM  Result Value Ref Range   Fecal Occult Bld NEGATIVE NEGATIVE  Protime-INR     Status: Abnormal   Collection Time: 03/20/14  7:25 AM  Result Value Ref Range   Prothrombin Time 19.9 (H) 11.6 - 15.2 seconds   INR 1.67 (H) 0.00 - 1.49  CBC     Status: Abnormal   Collection Time: 03/20/14  7:25 AM  Result Value Ref Range   WBC 6.7 4.0 - 10.5 K/uL   RBC 3.25 (L) 3.87 - 5.11 MIL/uL  Hemoglobin 9.6 (L) 12.0 - 15.0 g/dL   HCT 31.3 (L) 36.0 - 46.0 %   MCV 96.3 78.0 - 100.0 fL   MCH 29.5 26.0 - 34.0 pg   MCHC 30.7 30.0 - 36.0 g/dL   RDW 19.2 (H) 11.5 - 15.5 %   Platelets 167 150 - 400 K/uL     HEENT: tongue midline, no obvous neck swelling Cardio: RRR and no murmur Resp: CTA B/L and unlabored GI: BS positive and NT< ND Extremity:  Edema LUE> LLE Skin:   Bruise multiple ecchymotic areas BUE Neuro: alert, Cranial Nerve Abnormalities RIght central 7, Abnormal Sensory cannot assess due to aphasia, Abnormal Motor 0/5 RUE, 0/5  RLE, antigravity on Left but has receptive difficulties and apraxia limiting MMT, Dysarthric, Aphasic and Apraxic Musc/Skel:  Other No knee pain or effusion noted tolerates palpation Gen NAD,    Assessment/Plan: 1. Functional deficits secondary to Large left MCA infarct embolic which require 3+ hours per day of interdisciplinary therapy in a comprehensive inpatient rehab setting. Physiatrist is providing close team  supervision and 24 hour management of active medical problems listed below. Physiatrist and rehab team continue to assess barriers to discharge/monitor patient progress toward functional and medical goals.  FIM: FIM - Bathing Bathing: 1: Two helpers  FIM - Upper Body Dressing/Undressing Upper body dressing/undressing: 1: Total-Patient completed less than 25% of tasks FIM - Lower Body Dressing/Undressing Lower body dressing/undressing: 1: Two helpers  FIM - Toileting Toileting: 1: Total-Patient completed zero steps, helper did all 3     FIM - Control and instrumentation engineer Devices: Sliding board, Arm rests Bed/Chair Transfer: 1: Two helpers  FIM - Locomotion: Wheelchair Locomotion: Wheelchair: 1: Total Assistance/staff pushes wheelchair (Pt<25%) FIM - Locomotion: Ambulation Locomotion: Ambulation: 0: Activity did not occur  Comprehension Comprehension Mode: Auditory Comprehension: 1-Understands basic less than 25% of the time/requires cueing 75% of the time  Expression Expression Mode: Verbal Expression Assistive Devices:  (head nods) Expression: 1-Expresses basis less than 25% of the time/requires cueing greater than 75% of the time.  Social Interaction Social Interaction: 2-Interacts appropriately 25 - 49% of time - Needs frequent redirection.  Problem Solving Problem Solving: 1-Solves basic less than 25% of the time - needs direction nearly all the time or does not effectively solve problems and may need a restraint for safety  Memory Memory Mode: Not assessed (unable to assess because of aphasia) Memory: 1-Recognizes or recalls less than 25% of the time/requires cueing greater than 75% of the time  Medical Problem List and Plan: 1. Functional deficits secondary to Large L-MCA infarct with dense right hemiplegia, dysphagia and global aphasia.   2.  DVT Prophylaxis/Anticoagulation: Pharmaceutical: Lovenox to coumadin 3. Pain Management: tramadol,  tylenol, appropriate turning in bed, regular checks for comfort 4. Mood: LCSW to follow for evaluation and support.   5. Neuropsych: This patient is not capable of making decisions on her own behalf. 6. Skin/Wound Care:  Turn patient every 4 hours.  Rehab RN to monitor skin daily.  Maintain adequate nutrition and hydration status.   7. Fluids/Electrolytes/Nutrition:  Monitor I/O.Off IVF recheck BMET in am,per daughter eating better and taking thickened liquids -encourage po, full supervision with feeding 8. Hypertension: normotensive at present 9.  Thyroid mass question carcinoma, further eval as pt recovers from severe CVA 10.  Hx bilateral knee OA, saw ortho as outpt, diclofenac gel 11.  Thrombocytopenia stable- bruising but no obvious hematoma formation LOS (Days) 7 A FACE TO FACE EVALUATION WAS PERFORMED  Sandy Barrett 03/20/2014, 8:17 AM

## 2014-03-21 ENCOUNTER — Inpatient Hospital Stay (HOSPITAL_COMMUNITY): Payer: Medicare Other | Admitting: Occupational Therapy

## 2014-03-21 ENCOUNTER — Inpatient Hospital Stay (HOSPITAL_COMMUNITY): Payer: Medicare Other | Admitting: Physical Therapy

## 2014-03-21 LAB — PROTIME-INR
INR: 2.01 — ABNORMAL HIGH (ref 0.00–1.49)
Prothrombin Time: 22.9 seconds — ABNORMAL HIGH (ref 11.6–15.2)

## 2014-03-21 LAB — BASIC METABOLIC PANEL
Anion gap: 8 (ref 5–15)
BUN: 8 mg/dL (ref 6–23)
CO2: 25 mmol/L (ref 19–32)
Calcium: 8.9 mg/dL (ref 8.4–10.5)
Chloride: 109 mmol/L (ref 96–112)
Creatinine, Ser: 0.4 mg/dL — ABNORMAL LOW (ref 0.50–1.10)
GFR calc Af Amer: >90 mL/min (ref >90)
GFR calc non Af Amer: >90 mL/min (ref >90)
Glucose, Bld: 89 mg/dL (ref 70–99)
Potassium: 3.9 mmol/L (ref 3.5–5.1)
Sodium: 142 mmol/L (ref 135–145)

## 2014-03-21 LAB — CBC
HCT: 30.4 % — ABNORMAL LOW (ref 36.0–46.0)
Hemoglobin: 9.5 g/dL — ABNORMAL LOW (ref 12.0–15.0)
MCH: 30.4 pg (ref 26.0–34.0)
MCHC: 31.3 g/dL (ref 30.0–36.0)
MCV: 97.4 fL (ref 78.0–100.0)
Platelets: 158 10*3/uL (ref 150–400)
RBC: 3.12 MIL/uL — ABNORMAL LOW (ref 3.87–5.11)
RDW: 19.2 % — ABNORMAL HIGH (ref 11.5–15.5)
WBC: 6.3 10*3/uL (ref 4.0–10.5)

## 2014-03-21 MED ORDER — PANTOPRAZOLE SODIUM 40 MG PO PACK
40.0000 mg | PACK | Freq: Two times a day (BID) | ORAL | Status: DC
  Administered 2014-03-21 – 2014-04-05 (×29): 40 mg
  Filled 2014-03-21 (×34): qty 20

## 2014-03-21 MED ORDER — WARFARIN SODIUM 5 MG PO TABS
5.0000 mg | ORAL_TABLET | Freq: Once | ORAL | Status: AC
  Administered 2014-03-21: 5 mg via ORAL
  Filled 2014-03-21: qty 1

## 2014-03-21 NOTE — Progress Notes (Addendum)
ANTICOAGULATION CONSULT NOTE - Follow Up Consult  Pharmacy Consult for coumadin and lovenox Indication: DVT/PE  No Known Allergies  Patient Measurements: Height: 5\' 5"  (165.1 cm) IBW/kg (Calculated) : 57 Heparin Dosing Weight:   Vital Signs: Temp: 98.2 F (36.8 C) (03/16 0528) Temp Source: Oral (03/16 0528) BP: 111/67 mmHg (03/16 0528) Pulse Rate: 84 (03/16 0528)  Labs:  Recent Labs  03/19/14 0634 03/19/14 1202 03/20/14 0725 03/21/14 0705  HGB 9.3*  --  9.6* 9.5*  HCT 29.5*  --  31.3* 30.4*  PLT 168  --  167 158  LABPROT 18.4*  --  19.9* 22.9*  INR 1.51*  --  1.67* 2.01*  CREATININE  --  0.49*  --  0.40*    Estimated Creatinine Clearance: 59.4 mL/min (by C-G formula based on Cr of 0.4).   Medications:  Scheduled:  . antiseptic oral rinse  7 mL Mouth Rinse BID  . chlorhexidine  15 mL Mouth Rinse BID  . diclofenac sodium  2 g Topical QID  . enoxaparin (LOVENOX) injection  80 mg Subcutaneous Q12H  . feeding supplement (ENSURE)  1 Container Oral TID BM  . iron polysaccharides  150 mg Oral BID AC  . pantoprazole sodium  40 mg Per Tube Q24H  . Warfarin - Pharmacist Dosing Inpatient   Does not apply q1800   Infusions:    Assessment: 81 yo female with DVT/PE is currently on therapeutic coumadin.  INR up from 1.67 to 2.01 after one dose of coumadin 7.5 mg po x1 yesterday.  Hgb stable at 9.5 and Plt down a little to 158 K, respectively.  Goal of Therapy:  Anti-Xa level 0.6-1 units/ml 4hrs after LMWH dose given; INR 2-3 Monitor platelets by anticoagulation protocol: Yes   Plan:  - Coumadin 5 mg po x1 (may need 5 mg most days and 7.5 mg few days a week) - Continue lovenox 80mg  SQ Q12H.If INR > 2 tom, can d/c lovenox then - F/u CBC closely and HIT panel - Daily INR    Aarian Griffie, [facility] 03/21/2014,8:50 AM

## 2014-03-21 NOTE — Progress Notes (Signed)
Occupational Therapy Session Note  Patient Details  Name: Sandy Barrett MRN: [id number] Date of Birth: 1932-02-21  Today's Date: 03/21/2014 OT Individual Time: 6294-7654 OT Individual Time Calculation (min): 50 min  and Today's Date: 03/21/2014 OT Co-Treatment Time: 1405-1435 (entire time 1405-1505) OT Co-Treatment Time Calculation (min): 30 min   Short Term Goals: Week 1:  OT Short Term Goal 1 (Week 1): Pt will complete bathing with mod assist and mod cues for initiation OT Short Term Goal 2 (Week 1): Pt will complete UB dressing with mod assist and mod cues for initiation OT Short Term Goal 3 (Week 1): Pt will complete LB dressing with max assist of one caregiver with mod cues for initiation OT Short Term Goal 4 (Week 1): Pt will complete toilet transfer with max assist of one caregiver   Skilled Therapeutic Interventions/Progress Updates:    1) Engaged in ADL retraining with focus on toilet transfers, sit <> stand, and attention to RUE during mobility.  Pt in w/c upon arrival with daughter reporting she had been fidgety and requesting pt be adjusted in w/c.  Discussed timed toileting to increase success with continence and pt's daughter reports she feels that she is holding it.  Utilized Stedy and +2 to perform transfer to [facility] with focus on upright standing as needed for toileting tasks.  On BSC pt able to have continent BM.  With use of Stedy pt required +2 for sit > stand with LUE on Stedy rail for upright standing balance.  Therapist completed hygiene and clothing management while +2 ensured upright standing and providing support for RUE.  In standing pt tends to shift weight to Rt, required manual facilitation to promote midline orientation.  Pt transferred back to w/c with Stedy and positioned upright in w/c with daughter present.  2) Skilled co-treat with primary SLP with focus on initiation, sustained attention, sequencing, and forward weight shift as needed for functional tasks.  Pt  required max multimodal cues for 1 step sorting task for initiation and completion of task with pt able to pick up item and bring it across midline but then unable to terminate task to place it in the container.  Modified task to silverware with pt able to pick up forks and place them in fork slot with mod verbal cues only after 1 demonstration cue.  Pt able to sort forks and knives into appropriate slots, but unable to complete spoons furthest to Rt.  Question visual field cut, still unable to place spoons in farthest Rt slot even when placed at midline.  Increased challenge by having pt complete sorting task in unsupported sitting on edge of mat with pt requiring total +2 for squat pivot on and off mat.  In unsupported sitting pt able to maintain upright sitting with intermittent min assist and tactile cue at Rt knee due to tendency to go into extension.  Pt required mod-max multimodal cues with sorting task, requiring anterior weight shift to place items with only min/steady assist during weight shift.  See SLP note for further details regarding cognition.  Therapy Documentation Precautions:  Precautions Precautions: Fall Precaution Comments: [name] Restrictions Weight Bearing Restrictions: No Other Position/Activity Restrictions: R shoulder with sublux Pain:  Pt with no c/o pain  See FIM for current functional status  Therapy/Group: Individual Therapy and Co-Treatment  Sheryl Towell, [facility] 03/21/2014, 3:21 PM

## 2014-03-21 NOTE — Progress Notes (Signed)
Speech Language Pathology Weekly Progress and Session Note  Patient Details  Name: Sandy Barrett MRN: [id number] Date of Birth: 1932/10/05  Beginning of progress report period: March 14, 2014 End of progress report period: March 21, 2014  Today's Date: 03/21/2014 SLP Co-Treatment Time: 3734-2876 SLP Co-Treatment Time Calculation (min): 30 min Co-tx with OT [facility] [id number]   Short Term Goals: Week 1: SLP Short Term Goal 1 (Week 1): Pt will improve accuracy of yes/no responses to basic, biographical questions to >50% with max assist multimodal cuing.   SLP Short Term Goal 1 - Progress (Week 1): Progressing toward goal SLP Short Term Goal 2 (Week 1): Pt will improve focused attention to therapist or during basic, familiar tasks for 15-30 seconds with max assist.  SLP Short Term Goal 2 - Progress (Week 1): Met SLP Short Term Goal 3 (Week 1): Pt will complete verbal automatic sequences for 25% accuracy with max assist multimodal cues.   SLP Short Term Goal 3 - Progress (Week 1): Met SLP Short Term Goal 4 (Week 1): Pt will follow 1 step commands during basic, familiar tasks for 50% accuracy with max assist multimodal cuing.   SLP Short Term Goal 4 - Progress (Week 1): Progressing toward goal SLP Short Term Goal 5 (Week 1): Pt will scan to the right of midline during basic, familiar tasks over ~50% of observable opportunities with max multimodal cues.   SLP Short Term Goal 5 - Progress (Week 1): Progressing toward goal    New Short Term Goals: Week 2: SLP Short Term Goal 1 (Week 2): Pt will improve accuracy of yes/no responses to basic, biographical questions to >50% with max assist multimodal cuing.   SLP Short Term Goal 2 (Week 2): Pt will improve focused attention to therapist or during basic, familiar tasks for 30-45 seconds with max assist.  SLP Short Term Goal 3 (Week 2): Pt will complete verbal automatic sequences for 25-50% accuracy with max assist multimodal cues.   SLP Short Term Goal  4 (Week 2): Pt will scan to the right of midline during basic, familiar tasks over ~50% of observable opportunities with max multimodal cues.   SLP Short Term Goal 5 (Week 2): Pt will follow 1 step commands during basic, familiar tasks for 50% accuracy with max assist multimodal cuing.   SLP Short Term Goal 6 (Week 2): Pt will tolerate presentations of her currently prescribed diet with minimal overt s/s of aspiration and max assist for use of safe swallowing precautions.  Weekly Progress Updates:  Pt made slow functional gains this reporting period and has met 2 out of 5 short term goals. Pt currently requires max to total assist for basic cognitive-linguistic tasks.  Pt presents with significantly decreased functional communication due to global aphasia and apraxia with poor awareness of errors.  Pt has demonstrated slight improvements in yes/no accuracy in very basic and familiar contexts to convey some immediate needs/wants.  Pt exhibits decreased sustained attention and decreased initiation and cessation of tasks in a timely manner which impacts all higher level cognitive processes.  Pt also currently requires max assist for use of swallowing precautions to minimize overt s/s of aspiration with dys 1 solids and pudding thick liquids.  Pt and family education is ongoing.  Pt would continue to benefit from skilled ST while inpatient in order to maximize functional independence and reduce burden of care prior to discharge.     Intensity: Minumum of 1-2 x/day, 30 to 90 minutes Frequency: 3 to 5  out of 7 days Duration/Length of Stay: 21-28 days  Treatment/Interventions: Cognitive remediation/compensation;Cueing hierarchy;Dysphagia/aspiration precaution training;Functional tasks;Patient/family education;Speech/Language facilitation;Multimodal communication approach;Internal/external aids;Environmental controls   Daily Session  Skilled Therapeutic Interventions: Pt was seen for skilled ST/OT co-tx  targeting cognitive-linguistic goals.  See OT note for specific goals and interventions.  SLP facilitated the session with a basic task targeting successful initiation and cessation during activities.  Pt required hand over hand assist multimodal cues to initiate placing objects into a box to the right of midline; however, with more familiar items such as silverware, pt was able to sort items into groups of other like items (i.e. Placing spoons with other spoons) following initial demonstration cues.  Pt exhibited increased difficulty sorting between dissimilar objects in a field of three (differentiating between forks, knives, and spoons and placing them with the appropriate items) particularly with items that were placed in her right visual field.  Pt was noted with perseverative verbal behaviors with no apparent meaning in relation to the abovementioned activity for which she required max verbal and visual cues for cessation.  When engaged in verbal automatic sequences, pt was noted with approximations of ~20% of real words with max assist multimodal cues.  Continue per current plan of care.       FIM:  Comprehension Comprehension Mode: Auditory Comprehension: 1-Understands basic less than 25% of the time/requires cueing 75% of the time Expression Expression Mode: Nonverbal Expression: 1-Expresses basis less than 25% of the time/requires cueing greater than 75% of the time. Social Interaction Social Interaction: 2-Interacts appropriately 25 - 49% of time - Needs frequent redirection. Problem Solving Problem Solving: 1-Solves basic less than 25% of the time - needs direction nearly all the time or does not effectively solve problems and may need a restraint for safety Memory Memory: 1-Recognizes or recalls less than 25% of the time/requires cueing greater than 75% of the time General    Pain Pain Assessment Pain Assessment: Faces Faces Pain Scale: No hurt  Therapy/Group: Individual  Therapy  Tanija Germani, Selinda Orion 03/21/2014, 10:38 PM

## 2014-03-21 NOTE — Progress Notes (Signed)
Occupational Therapy Session Note  Patient Details  Name: Sandy Barrett MRN: [id number] Date of Birth: [birth year]  Today's Date: 03/21/2014 OT Individual Time: 1000-1035 OT Individual Time Calculation (min): 35 min    Short Term Goals: Week 1:  OT Short Term Goal 1 (Week 1): Pt will complete bathing with mod assist and mod cues for initiation OT Short Term Goal 2 (Week 1): Pt will complete UB dressing with mod assist and mod cues for initiation OT Short Term Goal 3 (Week 1): Pt will complete LB dressing with max assist of one caregiver with mod cues for initiation OT Short Term Goal 4 (Week 1): Pt will complete toilet transfer with max assist of one caregiver   Skilled Therapeutic Interventions/Progress Updates:  Upon entering the room, pt seated in wheelchair with daughter present in room. Pt with no reports,signs, or symptoms of pain during session. STS from wheelchair into stedy with 2 person assist. Pt initiating standing and reaching out with L hand towards bar to utilize to pull up on. Pt required total A for clothing management and hygiene for peri area and buttocks. While standing, pt incontinent bowel movement and requiring assistance for hygiene. UB tasks performed with pt seated in wheelchair. Daughter assisting pt with UB bathing and pt threading L arm into pull over shirt. Pt seated in wheelchair with daughter assisting pt with grooming upon exiting the room.   Therapy Documentation Precautions:  Precautions Precautions: Fall Precaution Comments: [name] Restrictions Weight Bearing Restrictions: No Other Position/Activity Restrictions: R shoulder with sublux Pain: Pain Assessment Pain Assessment: Faces Faces Pain Scale: Hurts a little bit Pain Type: Chronic pain Pain Location: Knee Pain Orientation: Right;Left Pain Descriptors / Indicators: Grimacing Pain Onset: With Activity Pain Intervention(s): RN made aware  See FIM for current functional  status  Therapy/Group: Individual Therapy  Phineas Semen 03/21/2014, 11:33 AM

## 2014-03-21 NOTE — Patient Care Conference (Signed)
Inpatient RehabilitationTeam Conference and Plan of Care Update Date: 03/21/2014   Time: 10;50 am    Patient Name: Sandy Barrett      Medical Record Number: [id number]  Date of Birth: July 09, 1932 Sex: Female         Room/Bed: 4W20C/4W20C-01 Payor Info: Payor: MEDICARE / Plan: MEDICARE PART A AND B / Product Type: *No Product type* /    Admitting Diagnosis: L MCA CVA  Admit Date/Time:  03/13/2014  4:08 PM Admission Comments: No comment available   Primary Diagnosis:  Cerebral infarction due to embolism of left middle cerebral artery Principal Problem: Cerebral infarction due to embolism of left middle cerebral artery  Patient Active Problem List   Diagnosis Date Noted  . Acute pulmonary embolism 03/14/2014  . DVT of lower extremity, bilateral 03/14/2014  . Global aphasia 03/14/2014  . 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 Dysphagia due to recent stroke 03/14/2014  . Aphasia due to stroke 03/13/2014  . Right hemiparesis 03/13/2014  . Embolic stroke   . Cerebral infarction due to embolism of left middle cerebral artery   . Cerebral infarction due to unspecified mechanism   . Respiratory failure   . Cerebral infarction due to thrombosis of right middle cerebral artery   . Stroke 03/08/2014  . Pulmonary embolism 03/08/2014  . CVA (cerebral infarction)   . Right sided weakness   . Back pain 08/29/2013  . Primary localized osteoarthrosis, lower leg 03/06/2013  . IBS (irritable bowel syndrome)   . 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 Multinodular goiter 01/13/2011  . Abnormal chest x-ray 12/12/2010  . HYPERTHYROIDISM 11/11/2009  . INSOMNIA 07/03/2009  . POSTTRAUMATIC STRESS DISORDER 08/22/2008  . OBESITY 04/21/2008  . OSTEOARTHRITIS 04/21/2008  . MIGRAINE HEADACHE 04/20/2008  . Essential hypertension 04/20/2008    Expected Discharge Date: Expected Discharge Date: 04/03/14  Team Members Present: Physician leading conference: Dr. Alysia Penna Social Worker Present: Ovidio Kin, LCSW Nurse Present: Heather Roberts, RN PT Present: Georjean Mode, PT;Blair [name], PT OT Present: Simonne Come, Dorothyann Gibbs, OT SLP Present: Windell Moulding, SLP PPS Coordinator present : Daiva Nakayama, RN, CRRN     Current Status/Progress Goal Weekly Team Focus  Medical   Severe aphasia, severe apraxia, severe right hemiparesis, DVT requiring anticoagulation  Maintain medical stability during rehabilitation stay  Monitor for signs of bleeding while on dual anticoagulants   Bowel/Bladder   patient is incontinent of bowel and  bladder  to become continent of bowel and bladder with mod assist  offer timed toileting   Swallow/Nutrition/ Hydration   Dys 1, pudding thick liquids via teaspoon, full supervision and hand over hand assist for swallowing precautions and self feeding  min assist with least restrictive diet   diet toleration    ADL's   total assist bed mobility, +2 for clothing management at bed level and at sit > stand level, +2 transfers.  Pt's cogntive deficits severly impacting her ability to participate in functional tasks  mod assist overall  initiation, Rt attention, transfers, sit <> stand   Mobility   Total A bed mobility, Total to +2A for basic transfers;  Min A bed mobility and w/c mobility; Mod A transfers  Initiation, attention to R visual field/hemi body, functional transfers, management of RLE tone   Communication   total assist for functional communication  max assist   multimodal communication, initiation and cessation of verbal and nonverbal communication   Safety/Cognition/ Behavioral Observations  decreased sustained attention, decreased alertness, poor initiation with perseveratioin  max assist for basic tasks   sustained attention, initiation and cessation during basic,familiar tasks    Pain   patient denies pain  pain less than or equal to 4 on a scale of 0-10  assess pain q4h and offer medication as indicated   Skin   MASD to bilateral butocks, boggy  heels  prevent further skin injury/breakdown  keep skin clean and dry, wear prafo boots when in bed.      *See Care Plan and progress notes for long and short-term goals.  Barriers to Discharge: Heavy assist level, multiple medical comorbidities    Possible Resolutions to Barriers:  Continue rehabilitation program    Discharge Planning/Teaching Needs:  daughter's here daily but feel best option is NHP upon discharge from rehab for longer rehab and more therapies.      Team Discussion:  Sounds less congested-more alert. Improvement but not enough for FIM change. R-inattention and initiation. Severe global aphasia. DYs 1 pudding thick she is hydrating herself on this diet. Medically needs to be here 3 weeks then to NH  Revisions to Treatment Plan:  Probably NH after rehab for more therapies and time to recover   Continued Need for Acute Rehabilitation Level of Care: The patient requires daily medical management by a physician with specialized training in physical medicine and rehabilitation for the following conditions: Daily direction of a multidisciplinary physical rehabilitation program to ensure safe treatment while eliciting the highest outcome that is of practical value to the patient.: Yes Daily medical management of patient stability for increased activity during participation in an intensive rehabilitation regime.: Yes Daily analysis of laboratory values and/or radiology reports with any subsequent need for medication adjustment of medical intervention for : Neurological problems;Other  Marvel Sapp, Gardiner Rhyme 03/22/2014, 3:17 PM

## 2014-03-21 NOTE — Progress Notes (Signed)
Physical Therapy Session Note  Patient Details  Name: Sandy Barrett MRN: [id number] Date of Birth: Sep 08, 1932  Today's Date: 03/21/2014 PT Individual Time: 0830-0930 PT Individual Time Calculation (min): 60 min   Short Term Goals: Week 1:  PT Short Term Goal 1 (Week 1): Pt will perform supine > sit with max A with HOB flat using rail with 75% cueing. PT Short Term Goal 2 (Week 1): Pt will perform sit > supine with HOB flat requiring max A and 75% cueing. PT Short Term Goal 3 (Week 1): Pt will consistently transfer from bed <> w/c with Max A of single therapist with 75% cueing. PT Short Term Goal 4 (Week 1):  Pt will perform w/c mobility x25' in controlled environment with Max A and 75% cueing for visual scanning/attention to R visual field. PT Short Term Goal 5 (Week 1): Pt will perform dynamic standing balance x3 consecutive minutes with LE support, without UE support requiring Mod A and 50% cueing.  Skilled Therapeutic Interventions/Progress Updates:    Pt received seated in w/c accompanied by daughter. Noted pt not yet dressed. With yes/no questions, able to discern pt desire to get dressed prior to leaving room. Therefore, assisted pt in donning pants/shirt with while seated in w/c (scooted forward in w/c seat with bilat LE's supported, no back support) with close supervision to min guard for dynamic sitting balance. Pt performed sit <> stand transfer from w/c with total A using bariatric Stedy with mod A for manual stabilization/positioning of RUE due to weakness, inattention, tactile cueing at R hip extensors for upright posture. Transported pt to/from treatment gym in w/c with total A. Pt performed the following transfers with Total A of single therapist: lateral scooting transfer from w/c > mat table with slide board,  sit <> stand transfers x2 trials from neutral height mat table, and squat pivot transfer from mat table > w/c. See Mobility section below for further details regarding  assist/cueing required with said transfers. Pt exhibited episode of tearfulness during this session due to apparent frustration with inability to adequately communicate with this therapist. Session ended in pt room, where pt was left reclined in tilt-in-space w/c with daughter present and all needs within reach.  Therapy Documentation Precautions:  Precautions Precautions: Fall Precaution Comments: [name] Restrictions Weight Bearing Restrictions: No Other Position/Activity Restrictions: R shoulder with sublux Pain: Pain Assessment Pain Assessment: Faces Faces Pain Scale: Hurts a little bit Pain Type: Chronic pain Pain Location: Knee Pain Orientation: Right;Left Pain Descriptors / Indicators: Grimacing Pain Onset: With Activity Pain Intervention(s): RN made aware Mobility: Transfers Sit to Stand: 1: +1 Total assist;From bed;From chair/3-in-1;Other/comment (from mat table) Sit to Stand Details: Manual facilitation for weight shifting;Tactile cues for initiation;Tactile cues for posture Stand to Sit: 1: +1 Total assist;Other (comment) (to mat table) Stand to Sit Details (indicate cue type and reason): Visual cues/gestures for sequencing;Manual facilitation for weight shifting Squat Pivot Transfers: With armrests;1: +1 Total assist Squat Pivot Transfer Details: Tactile cues for weight shifting;Manual facilitation for placement;Visual cues/gestures for sequencing Lateral/Scoot Transfers: With slide board;1: +1 Total assist Lateral/Scoot Transfer Details: Tactile cues for weight shifting;Verbal cues for technique;Verbal cues for sequencing;Visual cues for safe use of DME/AE Lateral/Scoot Transfer Details (indicate cue type and reason): Therapist explained, demonstrated slide board transfer. Pt then performed level lateral scooting transfer using slide board from w/c > mat table with +1Total A, cueing for placement of board and for technique, full anterior weight shift,  manual stabilization of LLE  due to extensor tone.  See FIM for current functional status  Therapy/Group: Individual Therapy  [name], [name] 03/21/2014, 12:50 PM

## 2014-03-21 NOTE — Progress Notes (Signed)
Social Work Patient ID: Sandy Barrett, female   DOB: 05/24/1932, 81 y.o.   MRN: [id number] Met with tow daughter's to inform of team conference goals-mod assist and her improvement this past week. Both feel she has made progress and are pleased with this.  She is hydrating herself on pudding thick liquids which is  Note worthy.  Daughter's have visited area facilities and would like [facility] or [facility]  Have had family members at both of them. MD feels pt will be medically ready last week of March.  Work on discharge plans. Daughter's transitioning going back to work.

## 2014-03-21 NOTE — Progress Notes (Signed)
BLE dopplers revealed DVT R-distal CFV, FV, popliteal vein and DVT L-FV and popliteal vein.  2D echo revealed EF 65-70% with interatrial septal aneurysm.   Protein C, Protein S and lupus anticoagulant negative. Surgery consult recommended for work up once patient stable and question of thyroid malignancy.Subjective/Complaints: Per daughter moving only Left side spontaneously ROS: cannot obtain due to aphasia Objective: Vital Signs: Blood pressure 111/67, pulse 84, temperature 98.2 F (36.8 C), temperature source Oral, resp. rate 17, height 5' 5" (1.651 m), SpO2 97 %. No results found. Results for orders placed or performed during the hospital encounter of 03/13/14 (from the past 72 hour(s))  Occult blood card to lab, stool RN will collect     Status: Abnormal   Collection Time: 03/18/14  2:10 PM  Result Value Ref Range   Fecal Occult Bld POSITIVE (A) NEGATIVE  CBC     Status: Abnormal   Collection Time: 03/19/14  6:34 AM  Result Value Ref Range   WBC 7.7 4.0 - 10.5 K/uL   RBC 3.11 (L) 3.87 - 5.11 MIL/uL   Hemoglobin 9.3 (L) 12.0 - 15.0 g/dL    Comment: REPEATED TO VERIFY   HCT 29.5 (L) 36.0 - 46.0 %   MCV 94.9 78.0 - 100.0 fL   MCH 29.9 26.0 - 34.0 pg   MCHC 31.5 30.0 - 36.0 g/dL   RDW 19.4 (H) 11.5 - 15.5 %   Platelets 168 150 - 400 K/uL    Comment: SPECIMEN CHECKED FOR CLOTS REPEATED TO VERIFY   Protime-INR     Status: Abnormal   Collection Time: 03/19/14  6:34 AM  Result Value Ref Range   Prothrombin Time 18.4 (H) 11.6 - 15.2 seconds   INR 1.51 (H) 0.00 - 9.37  Basic metabolic panel     Status: Abnormal   Collection Time: 03/19/14 12:02 PM  Result Value Ref Range   Sodium 141 135 - 145 mmol/L   Potassium 3.6 3.5 - 5.1 mmol/L   Chloride 106 96 - 112 mmol/L   CO2 27 19 - 32 mmol/L   Glucose, Bld 103 (H) 70 - 99 mg/dL   BUN 7 6 - 23 mg/dL   Creatinine, Ser 0.49 (L) 0.50 - 1.10 mg/dL   Calcium 9.2 8.4 - 10.5 mg/dL   GFR calc non Af Amer >90 >90 mL/min   GFR calc Af Amer  >90 >90 mL/min    Comment: (NOTE) The eGFR has been calculated using the CKD EPI equation. This calculation has not been validated in all clinical situations. eGFR's persistently <90 mL/min signify possible Chronic Kidney Disease.    Anion gap 8 5 - 15  Occult blood card to lab, stool RN will collect     Status: None   Collection Time: 03/19/14  1:43 PM  Result Value Ref Range   Fecal Occult Bld NEGATIVE NEGATIVE  Protime-INR     Status: Abnormal   Collection Time: 03/20/14  7:25 AM  Result Value Ref Range   Prothrombin Time 19.9 (H) 11.6 - 15.2 seconds   INR 1.67 (H) 0.00 - 1.49  CBC     Status: Abnormal   Collection Time: 03/20/14  7:25 AM  Result Value Ref Range   WBC 6.7 4.0 - 10.5 K/uL   RBC 3.25 (L) 3.87 - 5.11 MIL/uL   Hemoglobin 9.6 (L) 12.0 - 15.0 g/dL   HCT 31.3 (L) 36.0 - 46.0 %   MCV 96.3 78.0 - 100.0 fL   MCH 29.5 26.0 - 34.0  pg   MCHC 30.7 30.0 - 36.0 g/dL   RDW 19.2 (H) 11.5 - 15.5 %   Platelets 167 150 - 400 K/uL  Occult blood card to lab, stool RN will collect     Status: None   Collection Time: 03/20/14  3:08 PM  Result Value Ref Range   Fecal Occult Bld NEGATIVE NEGATIVE  CBC     Status: Abnormal   Collection Time: 03/21/14  7:05 AM  Result Value Ref Range   WBC 6.3 4.0 - 10.5 K/uL   RBC 3.12 (L) 3.87 - 5.11 MIL/uL   Hemoglobin 9.5 (L) 12.0 - 15.0 g/dL   HCT 30.4 (L) 36.0 - 46.0 %   MCV 97.4 78.0 - 100.0 fL   MCH 30.4 26.0 - 34.0 pg   MCHC 31.3 30.0 - 36.0 g/dL   RDW 19.2 (H) 11.5 - 15.5 %   Platelets 158 150 - 400 K/uL     HEENT: tongue midline, no obvous neck swelling Cardio: RRR and no murmur Resp: CTA B/L and unlabored GI: BS positive and NT< ND Extremity:  Edema LUE> LLE Skin:   Bruise multiple ecchymotic areas BUE Neuro: alert, Cranial Nerve Abnormalities RIght central 7, Abnormal Sensory cannot assess due to aphasia, Abnormal Motor 0/5 RUE, 0/5  RLE, antigravity on Left but has receptive difficulties and apraxia limiting MMT, Dysarthric,  Aphasic and Apraxic Musc/Skel:  Other No knee pain or effusion noted tolerates palpation Gen NAD,    Assessment/Plan: 1. Functional deficits secondary to Large left MCA infarct embolic which require 3+ hours per day of interdisciplinary therapy in a comprehensive inpatient rehab setting. Physiatrist is providing close team supervision and 24 hour management of active medical problems listed below. Physiatrist and rehab team continue to assess barriers to discharge/monitor patient progress toward functional and medical goals. Last two stools heme negative  FIM: FIM - Bathing Bathing: 1: Two helpers  FIM - Upper Body Dressing/Undressing Upper body dressing/undressing steps patient completed: Thread/unthread left sleeve of pullover shirt/dress Upper body dressing/undressing: 1: Total-Patient completed less than 25% of tasks FIM - Lower Body Dressing/Undressing Lower body dressing/undressing: 1: Two helpers  FIM - Toileting Toileting: 1: Two helpers     FIM - Control and instrumentation engineer Devices: HOB elevated Bed/Chair Transfer: 3: Supine > Sit: Mod A (lifting assist/Pt. 50-74%/lift 2 legs, 1: Bed > Chair or W/C: Total A (helper does all/Pt. < 25%), 1: Two helpers  FIM - Locomotion: Wheelchair Locomotion: Wheelchair: 0: Activity did not occur FIM - Locomotion: Ambulation Locomotion: Ambulation: 0: Activity did not occur  Comprehension Comprehension Mode: Auditory Comprehension: 1-Understands basic less than 25% of the time/requires cueing 75% of the time  Expression Expression Mode: Verbal Expression Assistive Devices:  (head nods) Expression: 1-Expresses basis less than 25% of the time/requires cueing greater than 75% of the time.  Social Interaction Social Interaction: 2-Interacts appropriately 25 - 49% of time - Needs frequent redirection.  Problem Solving Problem Solving: 1-Solves basic less than 25% of the time - needs direction nearly all the time  or does not effectively solve problems and may need a restraint for safety  Memory Memory Mode: Not assessed (unable to assess because of aphasia) Memory: 1-Recognizes or recalls less than 25% of the time/requires cueing greater than 75% of the time  Medical Problem List and Plan: 1. Functional deficits secondary to Large L-MCA infarct with dense right hemiplegia, dysphagia and global aphasia.   2.  DVT Prophylaxis/Anticoagulation: Pharmaceutical: Lovenox to coumadin 3. Pain Management:  tramadol, tylenol, appropriate turning in bed, regular checks for comfort 4. Mood: LCSW to follow for evaluation and support.   5. Neuropsych: This patient is not capable of making decisions on her own behalf. 6. Skin/Wound Care:  Turn patient every 4 hours.  Rehab RN to monitor skin daily.  Maintain adequate nutrition and hydration status.   7. Fluids/Electrolytes/Nutrition:  Monitor I/O.Off IVF recheck BMET in am,per daughter eating better and taking thickened liquids -encourage po, full supervision with feeding 8. Hypertension: normotensive at present 9.  Thyroid mass question carcinoma, further eval as pt recovers from severe CVA 10.  Hx bilateral knee OA, saw ortho as outpt, diclofenac gel 11.  Thrombocytopenia stable- bruising but no obvious hematoma formation LOS (Days) 8 A FACE TO FACE EVALUATION WAS PERFORMED  KIRSTEINS,ANDREW E 03/21/2014, 8:24 AM

## 2014-03-22 ENCOUNTER — Inpatient Hospital Stay (HOSPITAL_COMMUNITY): Payer: Medicare Other | Admitting: Occupational Therapy

## 2014-03-22 ENCOUNTER — Inpatient Hospital Stay (HOSPITAL_COMMUNITY): Payer: Medicare Other | Admitting: Speech Pathology

## 2014-03-22 ENCOUNTER — Inpatient Hospital Stay (HOSPITAL_COMMUNITY): Payer: Medicare Other

## 2014-03-22 DIAGNOSIS — E042 Nontoxic multinodular goiter: Secondary | ICD-10-CM

## 2014-03-22 LAB — CBC
HCT: 31.3 % — ABNORMAL LOW (ref 36.0–46.0)
Hemoglobin: 9.6 g/dL — ABNORMAL LOW (ref 12.0–15.0)
MCH: 29.9 pg (ref 26.0–34.0)
MCHC: 30.7 g/dL (ref 30.0–36.0)
MCV: 97.5 fL (ref 78.0–100.0)
Platelets: 136 10*3/uL — ABNORMAL LOW (ref 150–400)
RBC: 3.21 MIL/uL — ABNORMAL LOW (ref 3.87–5.11)
RDW: 19.2 % — ABNORMAL HIGH (ref 11.5–15.5)
WBC: 6.1 10*3/uL (ref 4.0–10.5)

## 2014-03-22 LAB — OCCULT BLOOD X 1 CARD TO LAB, STOOL: Fecal Occult Bld: POSITIVE — AB

## 2014-03-22 LAB — HEPARIN INDUCED THROMBOCYTOPENIA PNL: Heparin Induced Plt Ab: 1.108 OD — ABNORMAL HIGH (ref 0.000–0.400)

## 2014-03-22 LAB — SEROTONIN RELEASE ASSAY (SRA)
SRA .2 IU/mL UFH Ser-aCnc: 7 % (ref 0–20)
SRA 100IU/mL UFH Ser-aCnc: <1 % (ref 0–20)

## 2014-03-22 LAB — PROTIME-INR
INR: 2.37 — ABNORMAL HIGH (ref 0.00–1.49)
Prothrombin Time: 26.1 seconds — ABNORMAL HIGH (ref 11.6–15.2)

## 2014-03-22 MED ORDER — WARFARIN SODIUM 5 MG PO TABS
5.0000 mg | ORAL_TABLET | Freq: Once | ORAL | Status: AC
  Administered 2014-03-22: 5 mg via ORAL
  Filled 2014-03-22: qty 1

## 2014-03-22 NOTE — Progress Notes (Signed)
Physical Therapy Session Note  Patient Details  Name: Sandy Barrett MRN: [id number] Date of Birth: 10-Jun-1932  Today's Date: 03/22/2014 PT Individual Time: 0915-1015 PT Individual Time Calculation (min): 60 min   Short Term Goals: Week 2:     Skilled Therapeutic Interventions/Progress Updates:  Patient sitting in tilt in space wheelchair upon entering room. Right UE edematous. RN aware and reports that swelling is down from where it was. Daughter present during session. Session focused on transfers, sitting balance, attending to right, following commands, and retrograde massage for edema in right hand. Patient pushed in wheelchair to gym. Patient performed transfer wheelchair to mat with +2 assist - attempting to have patient scoot along sliding board but patient pushed into standing instead. Patient pushes heavily to right. Initial static sitting required max assist as patient was pushing to right. Patient was able to eventually maintain static sitting in midline with close supervision. Pt working in sitting on visual scanning across midline to right to find peg and place in peg board. Pt attended to task with mod cueing. Patient unable to accurately pick the color of the peg out of a choice of two. Patient able to reach 2 to 5 inches forward and to right to retrieve pegs and place in basket. Patient transferred mat to wheelchair with +2 assist - again patient stood to turn. Patient's right LE has increase of extensor tone during both transfers. Therapist performed retrograde massage and ROM to R hand to reduce edema. Discussed importance with daughter to keep right UE elevated. Patient tolerated well with no indication of pain in right hand with ROM. Patient left slightly tilted back in wheelchair with items in reach and daughter in room.   Therapy Documentation Precautions:  Precautions Precautions: Fall Precaution Comments: [name] Restrictions Weight Bearing  Restrictions: No Other Position/Activity Restrictions: R shoulder with sublux  Pain: Pain Assessment Pain Assessment: Faces Faces Pain Scale: No hurt    See FIM for current functional status  Therapy/Group: Individual Therapy  Elder Love M 03/22/2014, 10:32 AM

## 2014-03-22 NOTE — Progress Notes (Signed)
ANTICOAGULATION CONSULT NOTE - Follow Up Consult  Pharmacy Consult for coumadin and lovenox Indication: DVT/PE  No Known Allergies  Patient Measurements: Height: 5\' 5"  (165.1 cm) Weight: 175 lb 14.8 oz (79.8 kg) IBW/kg (Calculated) : 57 Heparin Dosing Weight:   Vital Signs: Temp: 98.8 F (37.1 C) (03/17 0434) Temp Source: Oral (03/17 0434) BP: 142/49 mmHg (03/17 0434) Pulse Rate: 98 (03/17 0434)  Labs:  Recent Labs  03/19/14 1202  03/20/14 0725 03/21/14 0705 03/22/14 0753  HGB  --   < > 9.6* 9.5* 9.6*  HCT  --   --  31.3* 30.4* 31.3*  PLT  --   --  167 158 136*  LABPROT  --   --  19.9* 22.9* 26.1*  INR  --   --  1.67* 2.01* 2.37*  CREATININE 0.49*  --   --  0.40*  --   < > = values in this interval not displayed.  Estimated Creatinine Clearance: 59.5 mL/min (by C-G formula based on Cr of 0.4).   Medications:  Scheduled:  . antiseptic oral rinse  7 mL Mouth Rinse BID  . chlorhexidine  15 mL Mouth Rinse BID  . diclofenac sodium  2 g Topical QID  . feeding supplement (ENSURE)  1 Container Oral TID BM  . iron polysaccharides  150 mg Oral BID AC  . pantoprazole sodium  40 mg Per Tube BID  . Warfarin - Pharmacist Dosing Inpatient   Does not apply q1800   Infusions:    Assessment: 81 yo female with DVT/PE is currently on lovenox bridge to therapeutic coumadin. INR 2.37.  Hgb stable at 9.6 and Plt down a little to 136K. HIT panel pending  Goal of Therapy:  INR 2-3 Monitor platelets by anticoagulation protocol: Yes   Plan:  - Coumadin 5 mg po x1 (may need 5 mg most days and 7.5 mg few days a week) - D/C lovenox 80mg   - F/u CBC closely and HIT panel - Daily INR    Maryanna Shape, PharmD, BCPS  Clinical Pharmacist  Pager: [phone]   03/22/2014,11:39 AM

## 2014-03-22 NOTE — Progress Notes (Signed)
Social Work Elease Hashimoto, LCSW Social Worker Signed  Patient Care Conference 03/21/2014  1:46 PM    Expand All Collapse All   Inpatient RehabilitationTeam Conference and Plan of Care Update Date: 03/21/2014   Time: 10;50 am     Patient Name: Sandy Barrett       Medical Record Number: [id number]  Date of Birth: 1932-08-19 Sex: Female         Room/Bed: 4W20C/4W20C-01 Payor Info: Payor: MEDICARE / Plan: MEDICARE PART A AND B / Product Type: *No Product type* /    Admitting Diagnosis: L MCA CVA   Admit Date/Time:  03/13/2014  4:08 PM Admission Comments: No comment available   Primary Diagnosis:  Cerebral infarction due to embolism of left middle cerebral artery Principal Problem: Cerebral infarction due to embolism of left middle cerebral artery    Patient Active Problem List     Diagnosis  Date Noted   .  Acute pulmonary embolism  03/14/2014   .  DVT of lower extremity, bilateral  03/14/2014   .  Global aphasia  03/14/2014   .  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  Dysphagia due to recent stroke  03/14/2014   .  Aphasia due to stroke  03/13/2014   .  Right hemiparesis  03/13/2014   .  Embolic stroke     .  Cerebral infarction due to embolism of left middle cerebral artery     .  Cerebral infarction due to unspecified mechanism     .  Respiratory failure     .  Cerebral infarction due to thrombosis of right middle cerebral artery     .  Stroke  03/08/2014   .  Pulmonary embolism  03/08/2014   .  CVA (cerebral infarction)     .  Right sided weakness     .  Back pain  08/29/2013   .  Primary localized osteoarthrosis, lower leg  03/06/2013   .  IBS (irritable bowel syndrome)     .  Jugular vein thrombosis  03/01/2011   .  Multinodular goiter  01/13/2011   .  Abnormal chest x-ray  12/12/2010   .  HYPERTHYROIDISM  11/11/2009   .  INSOMNIA  07/03/2009   .  POSTTRAUMATIC STRESS DISORDER  08/22/2008   .  OBESITY  04/21/2008   .  OSTEOARTHRITIS  04/21/2008   .  MIGRAINE  HEADACHE  04/20/2008   .  Essential hypertension  04/20/2008     Expected Discharge Date: Expected Discharge Date: 04/03/14  Team Members Present: Physician leading conference: Dr. Alysia Penna Social Worker Present: Ovidio Kin, LCSW Nurse Present: Heather Roberts, RN PT Present: Georjean Mode, PT;Blair [name], PT OT Present: Simonne Come, Zada [place], OT SLP Present: Windell Moulding, SLP PPS Coordinator present : Daiva Nakayama, RN, CRRN        Current Status/Progress  Goal  Weekly Team Focus   Medical     Severe aphasia, severe apraxia, severe right hemiparesis, DVT requiring anticoagulation  Maintain medical stability during rehabilitation stay  Monitor for signs of bleeding while on dual anticoagulants   Bowel/Bladder     patient is incontinent of bowel and  bladder   to become continent of bowel and bladder with mod assist   offer timed toileting   Swallow/Nutrition/ Hydration     Dys 1, pudding thick liquids via teaspoon, full supervision and hand over hand assist for swallowing precautions and self feeding   min assist with  least restrictive diet    diet toleration    ADL's     total assist bed mobility, +2 for clothing management at bed level and at sit > stand level, +2 transfers.  Pt's cogntive deficits severly impacting her ability to participate in functional tasks   mod assist overall  initiation, Rt attention, transfers, sit <> stand    Mobility     Total A bed mobility, Total to +2A for basic transfers;   Min A bed mobility and w/c mobility; Mod A transfers   Initiation, attention to R visual field/hemi body, functional transfers, management of RLE tone   Communication     total assist for functional communication  max assist   multimodal communication, initiation and cessation of verbal and nonverbal communication   Safety/Cognition/ Behavioral Observations    decreased sustained attention, decreased alertness, poor initiation with perseveratioin   max assist for  basic tasks   sustained attention, initiation and cessation during basic,familiar tasks    Pain     patient denies pain  pain less than or equal to 4 on a scale of 0-10  assess pain q4h and offer medication as indicated    Skin     MASD to bilateral butocks, boggy heels   prevent further skin injury/breakdown   keep skin clean and dry, wear prafo boots when in bed.      *See Care Plan and progress notes for long and short-term goals.    Barriers to Discharge:  Heavy assist level, multiple medical comorbidities     Possible Resolutions to Barriers:   Continue rehabilitation program     Discharge Planning/Teaching Needs:   daughter's here daily but feel best option is NHP upon discharge from rehab for longer rehab and more therapies.       Team Discussion:    Sounds less congested-more alert. Improvement but not enough for FIM change. R-inattention and initiation. Severe global aphasia. DYs 1 pudding thick she is hydrating herself on this diet. Medically needs to be here 3 weeks then to NH   Revisions to Treatment Plan:    Probably NH after rehab for more therapies and time to recover    Continued Need for Acute Rehabilitation Level of Care: The patient requires daily medical management by a physician with specialized training in physical medicine and rehabilitation for the following conditions: Daily direction of a multidisciplinary physical rehabilitation program to ensure safe treatment while eliciting the highest outcome that is of practical value to the patient.: Yes Daily medical management of patient stability for increased activity during participation in an intensive rehabilitation regime.: Yes Daily analysis of laboratory values and/or radiology reports with any subsequent need for medication adjustment of medical intervention for : Neurological problems;Other  Elease Hashimoto 03/22/2014, 3:17 PM                  Patient ID: Sandy Barrett, female   DOB: 1932/03/30, 81  y.o.   MRN: [id number]

## 2014-03-22 NOTE — Progress Notes (Signed)
Speech Language Pathology Daily Session Note  Patient Details  Name: Sandy Barrett MRN: [id number] Date of Birth: 06-21-1932  Today's Date: 03/22/2014 SLP Individual Time: 1425-1525 SLP Individual Time Calculation (min): 60 min  Short Term Goals: Week 2: SLP Short Term Goal 1 (Week 2): Pt will improve accuracy of yes/no responses to basic, biographical questions to >50% with max assist multimodal cuing.   SLP Short Term Goal 2 (Week 2): Pt will improve focused attention to therapist or during basic, familiar tasks for 30-45 seconds with max assist.  SLP Short Term Goal 3 (Week 2): Pt will complete verbal automatic sequences for 25-50% accuracy with max assist multimodal cues.   SLP Short Term Goal 4 (Week 2): Pt will scan to the right of midline during basic, familiar tasks over ~50% of observable opportunities with max multimodal cues.   SLP Short Term Goal 5 (Week 2): Pt will follow 1 step commands during basic, familiar tasks for 50% accuracy with max assist multimodal cuing.   SLP Short Term Goal 6 (Week 2): Pt will tolerate presentations of her currently prescribed diet with minimal overt s/s of aspiration and max assist for use of safe swallowing precautions.  Skilled Therapeutic Interventions:  Pt was seen for skilled ST targeting cognitive-linguistic goals.  Upon arrival, pt was seated upright in wheelchair, awake, lethargic, but conveyed agreement to participation in therapy via head nod in response to basic yes/no questions.  SLP facilitated the session with structured receptive language tasks; pt matched picture to object from a field of three for with max assist multimodal cuing (verbal, visual, tactile).  Informally, pt presents with small improvements in receptive language in a less structured context as evidenced by x1 instance of laughing in response to humor.  During expressive language tasks, including automatic sequences and functional words phrases, pt was able to achieve  approximations of consonants in words and mark the rhythm and cadence of structured words/phrases with max to total demonstration cues and melodic intonation techniques.  SLP provided playback of a recording of pt during the abovementioned task to improve awareness of verbal errors with pt exhibiting little to no awareness of deficits.  Pt demonstrated improved initiation and cessation of tasks in a timely manner with mod-max verbal and gestural cues.  Pt's daughter, [name], was present for the duration of today's therapy session and was updated regarding pt's current goals and progress in therapy.  Continue per current plan of care.     FIM:  Comprehension Comprehension Mode: Auditory Comprehension: 2-Understands basic 25 - 49% of the time/requires cueing 51 - 75% of the time Expression Expression Mode: Nonverbal;Verbal Expression: 1-Expresses basis less than 25% of the time/requires cueing greater than 75% of the time. Social Interaction Social Interaction: 2-Interacts appropriately 25 - 49% of time - Needs frequent redirection. Problem Solving Problem Solving: 2-Solves basic 25 - 49% of the time - needs direction more than half the time to initiate, plan or complete simple activities Memory Memory: 1-Recognizes or recalls less than 25% of the time/requires cueing greater than 75% of the time  Pain Pain Assessment Pain Assessment: Faces Faces Pain Scale: No hurt  Therapy/Group: Individual Therapy  Kaydince Towles, Selinda Orion 03/22/2014, 9:27 PM

## 2014-03-22 NOTE — Progress Notes (Signed)
BLE dopplers revealed DVT R-distal CFV, FV, popliteal vein and DVT L-FV and popliteal vein.  2D echo revealed EF 65-70% with interatrial septal aneurysm.   Protein C, Protein S and lupus anticoagulant negative. Surgery consult recommended for work up once patient stable and question of thyroid malignancy.Subjective/Complaints: Eating well, labs (BUN/Creat) stable ROS: cannot obtain due to aphasia Objective: Vital Signs: Blood pressure 142/49, pulse 98, temperature 98.8 F (37.1 C), temperature source Oral, resp. rate 18, height '5\' 5"'  (1.651 m), weight 79.8 kg (175 lb 14.8 oz), SpO2 95 %. No results found. Results for orders placed or performed during the hospital encounter of 03/13/14 (from the past 72 hour(s))  Basic metabolic panel     Status: Abnormal   Collection Time: 03/19/14 12:02 PM  Result Value Ref Range   Sodium 141 135 - 145 mmol/L   Potassium 3.6 3.5 - 5.1 mmol/L   Chloride 106 96 - 112 mmol/L   CO2 27 19 - 32 mmol/L   Glucose, Bld 103 (H) 70 - 99 mg/dL   BUN 7 6 - 23 mg/dL   Creatinine, Ser 0.49 (L) 0.50 - 1.10 mg/dL   Calcium 9.2 8.4 - 10.5 mg/dL   GFR calc non Af Amer >90 >90 mL/min   GFR calc Af Amer >90 >90 mL/min    Comment: (NOTE) The eGFR has been calculated using the CKD EPI equation. This calculation has not been validated in all clinical situations. eGFR's persistently <90 mL/min signify possible Chronic Kidney Disease.    Anion gap 8 5 - 15  Occult blood card to lab, stool RN will collect     Status: None   Collection Time: 03/19/14  1:43 PM  Result Value Ref Range   Fecal Occult Bld NEGATIVE NEGATIVE  Protime-INR     Status: Abnormal   Collection Time: 03/20/14  7:25 AM  Result Value Ref Range   Prothrombin Time 19.9 (H) 11.6 - 15.2 seconds   INR 1.67 (H) 0.00 - 1.49  CBC     Status: Abnormal   Collection Time: 03/20/14  7:25 AM  Result Value Ref Range   WBC 6.7 4.0 - 10.5 K/uL   RBC 3.25 (L) 3.87 - 5.11 MIL/uL   Hemoglobin 9.6 (L) 12.0 - 15.0  g/dL   HCT 31.3 (L) 36.0 - 46.0 %   MCV 96.3 78.0 - 100.0 fL   MCH 29.5 26.0 - 34.0 pg   MCHC 30.7 30.0 - 36.0 g/dL   RDW 19.2 (H) 11.5 - 15.5 %   Platelets 167 150 - 400 K/uL  Occult blood card to lab, stool RN will collect     Status: None   Collection Time: 03/20/14  3:08 PM  Result Value Ref Range   Fecal Occult Bld NEGATIVE NEGATIVE  Protime-INR     Status: Abnormal   Collection Time: 03/21/14  7:05 AM  Result Value Ref Range   Prothrombin Time 22.9 (H) 11.6 - 15.2 seconds   INR 2.01 (H) 0.00 - 1.49  CBC     Status: Abnormal   Collection Time: 03/21/14  7:05 AM  Result Value Ref Range   WBC 6.3 4.0 - 10.5 K/uL   RBC 3.12 (L) 3.87 - 5.11 MIL/uL   Hemoglobin 9.5 (L) 12.0 - 15.0 g/dL   HCT 30.4 (L) 36.0 - 46.0 %   MCV 97.4 78.0 - 100.0 fL   MCH 30.4 26.0 - 34.0 pg   MCHC 31.3 30.0 - 36.0 g/dL   RDW 19.2 (H) 11.5 -  15.5 %   Platelets 158 150 - 400 K/uL  Basic metabolic panel     Status: Abnormal   Collection Time: 03/21/14  7:05 AM  Result Value Ref Range   Sodium 142 135 - 145 mmol/L   Potassium 3.9 3.5 - 5.1 mmol/L   Chloride 109 96 - 112 mmol/L   CO2 25 19 - 32 mmol/L   Glucose, Bld 89 70 - 99 mg/dL   BUN 8 6 - 23 mg/dL   Creatinine, Ser 0.40 (L) 0.50 - 1.10 mg/dL   Calcium 8.9 8.4 - 10.5 mg/dL   GFR calc non Af Amer >90 >90 mL/min   GFR calc Af Amer >90 >90 mL/min    Comment: (NOTE) The eGFR has been calculated using the CKD EPI equation. This calculation has not been validated in all clinical situations. eGFR's persistently <90 mL/min signify possible Chronic Kidney Disease.    Anion gap 8 5 - 15     HEENT: tongue midline,  neck vein prominence Cardio: RRR and no murmur Resp: CTA B/L and unlabored GI: BS positive and NT< ND Extremity:  Edema LUE> LLE Skin:   Bruise multiple ecchymotic areas BUE Neuro: alert, Cranial Nerve Abnormalities RIght central 7, Abnormal Sensory cannot assess due to aphasia, Abnormal Motor 0/5 RUE, 0/5  RLE, antigravity on Left but  has receptive difficulties and apraxia limiting MMT, Dysarthric, Aphasic and Apraxic Musc/Skel:  No pain with RUE ROM Gen NAD,    Assessment/Plan: 1. Functional deficits secondary to Large left MCA infarct embolic which require 3+ hours per day of interdisciplinary therapy in a comprehensive inpatient rehab setting. Physiatrist is providing close team supervision and 24 hour management of active medical problems listed below. Physiatrist and rehab team continue to assess barriers to discharge/monitor patient progress toward functional and medical goals. Spoke with daughter , looks like we will not need feeding tube, cont to monitor for signs of PNA FIM: FIM - Bathing Bathing: 1: Two helpers  FIM - Upper Body Dressing/Undressing Upper body dressing/undressing steps patient completed: Thread/unthread left sleeve of pullover shirt/dress Upper body dressing/undressing: 1: Total-Patient completed less than 25% of tasks FIM - Lower Body Dressing/Undressing Lower body dressing/undressing: 1: Two helpers  FIM - Toileting Toileting: 1: Two helpers  FIM - Radio producer Devices: Engineer, civil (consulting) Customer service manager) Toilet Transfers: 1-Mechanical lift, 1-Two helpers  FIM - Control and instrumentation engineer Devices: Sliding board, Arm rests Bed/Chair Transfer: 1: Mechanical lift, 1: Two helpers  FIM - Locomotion: Wheelchair Locomotion: Wheelchair: 1: Total Assistance/staff pushes wheelchair (Pt<25%) FIM - Locomotion: Ambulation Locomotion: Ambulation: 0: Activity did not occur  Comprehension Comprehension Mode: Auditory Comprehension: 1-Understands basic less than 25% of the time/requires cueing 75% of the time  Expression Expression Mode: Nonverbal Expression Assistive Devices:  (head nods) Expression: 1-Expresses basis less than 25% of the time/requires cueing greater than 75% of the time.  Social Interaction Social Interaction: 2-Interacts  appropriately 25 - 49% of time - Needs frequent redirection.  Problem Solving Problem Solving: 1-Solves basic less than 25% of the time - needs direction nearly all the time or does not effectively solve problems and may need a restraint for safety  Memory Memory Mode: Not assessed (unable to assess because of aphasia) Memory: 1-Recognizes or recalls less than 25% of the time/requires cueing greater than 75% of the time  Medical Problem List and Plan: 1. Functional deficits secondary to Large L-MCA infarct with dense right hemiplegia, dysphagia and global aphasia.   2.  DVT Prophylaxis/Anticoagulation: Pharmaceutical:D/C Lovenox if INR >1.9 cont  Coumadin for DVT 3. Pain Management: tramadol, tylenol, appropriate turning in bed, regular checks for comfort 4. Mood: LCSW to follow for evaluation and support.   5. Neuropsych: This patient is not capable of making decisions on her own behalf. 6. Skin/Wound Care:  Turn patient every 4 hours.  Rehab RN to monitor skin daily.  Maintain adequate nutrition and hydration status.   7. Fluids/Electrolytes/Nutrition:  Monitor I/O.Off IVF recheck BMET in am,per daughter eating better and taking thickened liquids -encourage po, full supervision with feeding 8. Hypertension: normotensive at present 9.  Thyroid mass question carcinoma, further eval as pt recovers from severe CVA 10.  Hx bilateral knee OA, saw ortho as outpt, diclofenac gel 11.  Thrombocytopenia stable- bruising but no obvious hematoma formation LOS (Days) 9 A FACE TO FACE EVALUATION WAS PERFORMED  Blanche Scovell E 03/22/2014, 8:03 AM

## 2014-03-22 NOTE — Progress Notes (Signed)
Occupational Therapy Weekly Progress Note  Patient Details  Name: Sandy Barrett MRN: [id number] Date of Birth: Apr 30, 1932  Beginning of progress report period: March 14, 2014 End of progress report period: March 22, 2014  Today's Date: 03/22/2014 OT Individual Time: 1100-1200 OT Individual Time Calculation (min): 60 min    Patient has met 0 of 4 short term goals.  Pt is making slow progress towards goals due greatly to her impaired cognition.  Pt requires increased multimodal cues for initiation, sequencing, and often hand of hand to fully participate in tasks.  Pt has demonstrated mild increase in initiation with functional tasks of dressing.  Pt tends to benefit from very basic functional tasks to increase participation.  Currently requires +2 assist for all self-care tasks and transfers.  Pt may benefit from a sling or kinesio taping to decrease edema in Rt hand as well as increase safe placement of RUE during mobility.  Patient continues to demonstrate the following deficits: cognitive deficits, impaired initiation, sequencing, motor planning, ideational apraxia, sitting balance, standing balance, sit <> stand, flaccid RUE and therefore will continue to benefit from skilled OT intervention to enhance overall performance with Reduce care partner burden.  Patient progressing toward long term goals..  Continue plan of care.  OT Short Term Goals Week 1:  OT Short Term Goal 1 (Week 1): Pt will complete bathing with mod assist and mod cues for initiation OT Short Term Goal 1 - Progress (Week 1): Not met OT Short Term Goal 2 (Week 1): Pt will complete UB dressing with mod assist and mod cues for initiation OT Short Term Goal 2 - Progress (Week 1): Partly met OT Short Term Goal 3 (Week 1): Pt will complete LB dressing with max assist of one caregiver with mod cues for initiation OT Short Term Goal 3 - Progress (Week 1): Not met OT Short Term Goal 4 (Week 1): Pt will complete toilet transfer  with max assist of one caregiver  OT Short Term Goal 4 - Progress (Week 1): Not met Week 2:  OT Short Term Goal 1 (Week 2): Pt will complete bathing with mod assist and mod cues for initiation OT Short Term Goal 2 (Week 2): Pt will complete LB dressing with max assist of one caregiver with mod cues for initiation OT Short Term Goal 3 (Week 2): Pt will complete UB dressing with mod assist and mod cues for initiation OT Short Term Goal 4 (Week 2): Pt will complete toilet transfer with max assist of one caregiver   Skilled Therapeutic Interventions/Progress Updates:    Engaged in ADL retraining with focus on increased initiation, attention to Rt environment and RUE, sit <> stand, and transfers.  Pt received upright in w/c willing to participate in treatment session.  Completed UB bathing at sink with use of mirror for visual feedback and therapist on Rt to promote scanning to Rt.  Pt able to wash face with increased time and initiated washing chest this session, but required increased time and hand over hand assist for thoroughness.  Pt donned Lt arm of bra and shirt but unable to complete dressing task this session, however did scan to Rt hand to attempt to thread bra onto Rt arm.  Pt began fidgeting with hospital incontinence brief, recommended toileting.  Use of Stedy with sit > stand and transfer to [facility] with +2 to manage RUE.  Pt with continent BM on toilet with increased time, required +2 for sit > stand with Stedy with pt able  to place LUE onto bar of Stedy to assist with forward weight shift for sit > stand.  Pt with increased initiation this session with dressing and improved communication through gestures to promote continent BM.  Pt left seated in w/c with daughter present.  Therapy Documentation Precautions:  Precautions Precautions: Fall Precaution Comments: [name] Restrictions Weight Bearing Restrictions: No Other Position/Activity Restrictions: R shoulder with  sublux Pain: Pain Assessment Pain Assessment: Faces Faces Pain Scale: No hurt  See FIM for current functional status  Therapy/Group: Individual Therapy  Simonne Come 03/22/2014, 10:59 AM

## 2014-03-23 ENCOUNTER — Inpatient Hospital Stay (HOSPITAL_COMMUNITY): Payer: Medicare Other

## 2014-03-23 ENCOUNTER — Inpatient Hospital Stay (HOSPITAL_COMMUNITY): Payer: Medicare Other | Admitting: Physical Therapy

## 2014-03-23 ENCOUNTER — Inpatient Hospital Stay (HOSPITAL_COMMUNITY): Payer: Medicare Other | Admitting: Occupational Therapy

## 2014-03-23 ENCOUNTER — Inpatient Hospital Stay (HOSPITAL_COMMUNITY): Payer: Medicare Other | Admitting: Speech Pathology

## 2014-03-23 LAB — PROTIME-INR
INR: 2.6 — ABNORMAL HIGH (ref 0.00–1.49)
Prothrombin Time: 28.1 seconds — ABNORMAL HIGH (ref 11.6–15.2)

## 2014-03-23 LAB — CBC
HCT: 29.7 % — ABNORMAL LOW (ref 36.0–46.0)
Hemoglobin: 9.1 g/dL — ABNORMAL LOW (ref 12.0–15.0)
MCH: 29.8 pg (ref 26.0–34.0)
MCHC: 30.6 g/dL (ref 30.0–36.0)
MCV: 97.4 fL (ref 78.0–100.0)
Platelets: 162 10*3/uL (ref 150–400)
RBC: 3.05 MIL/uL — ABNORMAL LOW (ref 3.87–5.11)
RDW: 19.2 % — ABNORMAL HIGH (ref 11.5–15.5)
WBC: 6.1 10*3/uL (ref 4.0–10.5)

## 2014-03-23 MED ORDER — WARFARIN SODIUM 4 MG PO TABS
4.0000 mg | ORAL_TABLET | Freq: Once | ORAL | Status: AC
  Administered 2014-03-23: 4 mg via ORAL
  Filled 2014-03-23: qty 1

## 2014-03-23 NOTE — Progress Notes (Signed)
Speech Language Pathology Daily Session Note  Patient Details  Name: Sandy Barrett MRN: [id number] Date of Birth: [birth year]  Today's Date: 03/23/2014 SLP Individual Time: 1005-1105 SLP Individual Time Calculation (min): 60 min  Short Term Goals: Week 2: SLP Short Term Goal 1 (Week 2): Pt will improve accuracy of yes/no responses to basic, biographical questions to >50% with max assist multimodal cuing.   SLP Short Term Goal 2 (Week 2): Pt will improve focused attention to therapist or during basic, familiar tasks for 30-45 seconds with max assist.  SLP Short Term Goal 3 (Week 2): Pt will complete verbal automatic sequences for 25-50% accuracy with max assist multimodal cues.   SLP Short Term Goal 4 (Week 2): Pt will scan to the right of midline during basic, familiar tasks over ~50% of observable opportunities with max multimodal cues.   SLP Short Term Goal 5 (Week 2): Pt will follow 1 step commands during basic, familiar tasks for 50% accuracy with max assist multimodal cuing.   SLP Short Term Goal 6 (Week 2): Pt will tolerate presentations of her currently prescribed diet with minimal overt s/s of aspiration and max assist for use of safe swallowing precautions.  Skilled Therapeutic Interventions:  Pt was seen for skilled ST targeting goals for functional communication and dysphagia.  Upon arrival, pt was seated upright in wheelchair, awake, lethargic but pleasant.  SLP facilitated the session with trials of honey thick liquids to continue working towards liquids advancement with pt demonstrating right anterior loss of materials due to decreased labial seal, no overt s/s of aspiration.  Pt required required max faded to mod cues for initiation of self feeding tasks, max to total assist for rate and portion control to minimize s/s of aspiration, and max to total to correct anterior loss of boluses.  SLP also facilitated the session with a repeated receptive language task during which pt  required total assist to match picture to object from a field of three which represents a decline from yesterday's therapy session.  Suspect fatigue to be primary contributing factor.  Pt demonstrated slight improvements for verbal expression during automatic sequences and repeating familiar phrases with pt able to approximate <25% of vowels in the middle of words and >25% of initial bilabial consonants with repetitive practice and phonemic placement cues.  Continue per current plan of care  FIM:  Comprehension Comprehension Mode: Auditory Comprehension: 2-Understands basic 25 - 49% of the time/requires cueing 51 - 75% of the time Expression Expression Mode: Nonverbal;Verbal Expression: 1-Expresses basis less than 25% of the time/requires cueing greater than 75% of the time. Social Interaction Social Interaction: 2-Interacts appropriately 25 - 49% of time - Needs frequent redirection. Problem Solving Problem Solving: 2-Solves basic 25 - 49% of the time - needs direction more than half the time to initiate, plan or complete simple activities Memory Memory: 1-Recognizes or recalls less than 25% of the time/requires cueing greater than 75% of the time  Pain Pain Assessment Pain Assessment: Faces Faces Pain Scale: No hurt  Therapy/Group: Individual Therapy  Dwana Garin, Selinda Orion 03/23/2014, 2:55 PM

## 2014-03-23 NOTE — Progress Notes (Signed)
Orthopedic Tech Progress Note Patient Details:  Sandy Barrett 12/04/1932 [id number]  Ortho Devices Type of Ortho Device: Arm sling Ortho Device/Splint Location: RUE Ortho Device/Splint Interventions: Ordered, Application   Braulio Bosch 03/23/2014, 3:48 PM

## 2014-03-23 NOTE — Plan of Care (Signed)
Problem: RH Bed Mobility Goal: LTG Patient will perform bed mobility with assist (PT) LTG: Patient will perform bed mobility with assistance, with/without cues (PT).  Downgraded secondary to slow pt progress.

## 2014-03-23 NOTE — Progress Notes (Signed)
BLE dopplers revealed DVT R-distal CFV, FV, popliteal vein and DVT L-FV and popliteal vein.  2D echo revealed EF 65-70% with interatrial septal aneurysm.   Protein C, Protein S and lupus anticoagulant negative. Surgery consult recommended for work up once patient stable and question of thyroid malignancy.    Subjective/Complaints: Eating well, labs (BUN/Creat) stable  ROS: cannot obtain due to aphasia Objective: Vital Signs: Blood pressure 129/69, pulse 81, temperature 97.9 F (36.6 C), temperature source Oral, resp. rate 18, height '5\' 5"'  (1.651 m), weight 79.8 kg (175 lb 14.8 oz), SpO2 98 %. No results found. Results for orders placed or performed during the hospital encounter of 03/13/14 (from the past 72 hour(s))  Occult blood card to lab, stool RN will collect     Status: None   Collection Time: 03/20/14  3:08 PM  Result Value Ref Range   Fecal Occult Bld NEGATIVE NEGATIVE  Protime-INR     Status: Abnormal   Collection Time: 03/21/14  7:05 AM  Result Value Ref Range   Prothrombin Time 22.9 (H) 11.6 - 15.2 seconds   INR 2.01 (H) 0.00 - 1.49  CBC     Status: Abnormal   Collection Time: 03/21/14  7:05 AM  Result Value Ref Range   WBC 6.3 4.0 - 10.5 K/uL   RBC 3.12 (L) 3.87 - 5.11 MIL/uL   Hemoglobin 9.5 (L) 12.0 - 15.0 g/dL   HCT 30.4 (L) 36.0 - 46.0 %   MCV 97.4 78.0 - 100.0 fL   MCH 30.4 26.0 - 34.0 pg   MCHC 31.3 30.0 - 36.0 g/dL   RDW 19.2 (H) 11.5 - 15.5 %   Platelets 158 150 - 400 K/uL  Basic metabolic panel     Status: Abnormal   Collection Time: 03/21/14  7:05 AM  Result Value Ref Range   Sodium 142 135 - 145 mmol/L   Potassium 3.9 3.5 - 5.1 mmol/L   Chloride 109 96 - 112 mmol/L   CO2 25 19 - 32 mmol/L   Glucose, Bld 89 70 - 99 mg/dL   BUN 8 6 - 23 mg/dL   Creatinine, Ser 0.40 (L) 0.50 - 1.10 mg/dL   Calcium 8.9 8.4 - 10.5 mg/dL   GFR calc non Af Amer >90 >90 mL/min   GFR calc Af Amer >90 >90 mL/min    Comment: (NOTE) The eGFR has been calculated using the CKD  EPI equation. This calculation has not been validated in all clinical situations. eGFR's persistently <90 mL/min signify possible Chronic Kidney Disease.    Anion gap 8 5 - 15  Protime-INR     Status: Abnormal   Collection Time: 03/22/14  7:53 AM  Result Value Ref Range   Prothrombin Time 26.1 (H) 11.6 - 15.2 seconds   INR 2.37 (H) 0.00 - 1.49  CBC     Status: Abnormal   Collection Time: 03/22/14  7:53 AM  Result Value Ref Range   WBC 6.1 4.0 - 10.5 K/uL   RBC 3.21 (L) 3.87 - 5.11 MIL/uL   Hemoglobin 9.6 (L) 12.0 - 15.0 g/dL   HCT 31.3 (L) 36.0 - 46.0 %   MCV 97.5 78.0 - 100.0 fL   MCH 29.9 26.0 - 34.0 pg   MCHC 30.7 30.0 - 36.0 g/dL   RDW 19.2 (H) 11.5 - 15.5 %   Platelets 136 (L) 150 - 400 K/uL  Occult blood card to lab, stool RN will collect     Status: Abnormal   Collection  Time: 03/22/14  1:34 PM  Result Value Ref Range   Fecal Occult Bld POSITIVE (A) NEGATIVE     HEENT: tongue midline,  neck vein prominence Cardio: RRR and no murmur Resp: CTA B/L and unlabored GI: BS positive and NT< ND Extremity:  Edema LUE> LLE Skin:   Bruise multiple ecchymotic areas BUE Neuro: alert, Cranial Nerve Abnormalities RIght central 7, Abnormal Sensory cannot assess due to aphasia, Abnormal Motor 0/5 RUE, 0/5  RLE, antigravity on Left but has receptive difficulties and apraxia limiting MMT, Dysarthric, Aphasic and Apraxic Musc/Skel:  No pain with RUE ROM Gen NAD,    Assessment/Plan: 1. Functional deficits secondary to Large left MCA infarct embolic which require 3+ hours per day of interdisciplinary therapy in a comprehensive inpatient rehab setting. Physiatrist is providing close team supervision and 24 hour management of active medical problems listed below. Physiatrist and rehab team continue to assess barriers to discharge/monitor patient progress toward functional and medical goals.  FIM: FIM - Bathing Bathing Steps Patient Completed: Chest Bathing: 1: Two helpers  FIM - Upper  Body Dressing/Undressing Upper body dressing/undressing steps patient completed: Thread/unthread left bra strap, Thread/unthread left sleeve of pullover shirt/dress Upper body dressing/undressing: 1: Total-Patient completed less than 25% of tasks FIM - Lower Body Dressing/Undressing Lower body dressing/undressing: 1: Two helpers  FIM - Toileting Toileting: 1: Two helpers  FIM - Radio producer Devices: Engineer, civil (consulting) Customer service manager) Toilet Transfers: 1-Mechanical lift, 1-Two helpers  FIM - Control and instrumentation engineer Devices: Sliding board, Arm rests Bed/Chair Transfer: 1: Two helpers  FIM - Locomotion: Glass blower/designer: Wheelchair: 1: Total Assistance/staff pushes wheelchair (Pt<25%) FIM - Locomotion: Ambulation Locomotion: Ambulation: 0: Activity did not occur  Comprehension Comprehension Mode: Auditory Comprehension: 2-Understands basic 25 - 49% of the time/requires cueing 51 - 75% of the time  Expression Expression Mode: Nonverbal, Verbal Expression Assistive Devices:  (head nods) Expression: 1-Expresses basis less than 25% of the time/requires cueing greater than 75% of the time.  Social Interaction Social Interaction: 2-Interacts appropriately 25 - 49% of time - Needs frequent redirection.  Problem Solving Problem Solving: 2-Solves basic 25 - 49% of the time - needs direction more than half the time to initiate, plan or complete simple activities  Memory Memory Mode: Not assessed (unable to assess because of aphasia) Memory: 1-Recognizes or recalls less than 25% of the time/requires cueing greater than 75% of the time  Medical Problem List and Plan: 1. Functional deficits secondary to Large L-MCA infarct with dense right hemiplegia, dysphagia and global aphasia.   2.  DVT Prophylaxis/Anticoagulation: Pharmaceutical  Coumadin for DVT 3. Pain Management: tramadol, tylenol, appropriate turning in bed, regular checks for  comfort 4. Mood: LCSW to follow for evaluation and support.   5. Neuropsych: This patient is not capable of making decisions on her own behalf. 6. Skin/Wound Care:  Turn patient every 4 hours.  Rehab RN to monitor skin daily.  Maintain adequate nutrition and hydration status.   7. Fluids/Electrolytes/Nutrition:  Monitor I/O.Off IVF recheck BMET in am,per daughter eating better and taking thickened liquids -encourage po, full supervision with feeding 8. Hypertension: normotensive at present 9.  Thyroid mass question carcinoma, further eval as pt recovers from severe CVA 10.  Hx bilateral knee OA, saw ortho as outpt, diclofenac gel 11.  Thrombocytopenia stable- bruising but no obvious hematoma formation LOS (Days) 10 A FACE TO FACE EVALUATION WAS PERFORMED  KIRSTEINS,ANDREW E 03/23/2014, 7:41 AM

## 2014-03-23 NOTE — Progress Notes (Signed)
Physical Therapy Session Note  Patient Details  Name: Sandy Barrett MRN: [id number] Date of Birth: 1932/03/22  Today's Date: 03/23/2014 PT Individual Time: 1105-1205 PT Individual Time Calculation (min): 60 min   Short Term Goals: Week 1:  PT Short Term Goal 1 (Week 1): Pt will perform supine > sit with max A with HOB flat using rail with 75% cueing. PT Short Term Goal 2 (Week 1): Pt will perform sit > supine with HOB flat requiring max A and 75% cueing. PT Short Term Goal 3 (Week 1): Pt will consistently transfer from bed <> w/c with Max A of single therapist with 75% cueing. PT Short Term Goal 4 (Week 1):  Pt will perform w/c mobility x25' in controlled environment with Max A and 75% cueing for visual scanning/attention to R visual field. PT Short Term Goal 5 (Week 1): Pt will perform dynamic standing balance x3 consecutive minutes with LE support, without UE support requiring Mod A and 50% cueing.  Skilled Therapeutic Interventions/Progress Updates:  Pt received in w/c, agreeable to therapy.  Treatment focused on bed mobility and NMR. Performed squat-pivot transfer total +2 w/c>bed.  Pt performed bed mobility total assist to remove soiled brief and don clean brief. PNF D2 Flexion rhythmic initiation performed on R LE to simulate functional movements and manage LE tone.  Pt transferred supine>sit total A +2 to change nightgown and transfer back to w/c for lunch. Please see below for cueing details involved in transfers. Pt left in w/c with quick release belt applied, all needs within reach and daughter present. RN notified of malodorous urine.     Therapy Documentation Precautions:  Precautions Precautions: Fall Precaution Comments: [name] Restrictions Weight Bearing Restrictions: No Other Position/Activity Restrictions: R shoulder with sublux Vital Signs: Therapy Vitals Temp: 98.6 F (37 C) Temp Source: Oral Pulse Rate: 83 Resp: 18 BP: 91/60 mmHg Patient  Position (if appropriate): Sitting Oxygen Therapy SpO2: 98 % Pain: Pain Assessment Pain Assessment: Faces Faces Pain Scale: No hurt Mobility: Bed Mobility Bed Mobility: Rolling Right;Rolling Left;Supine to Sit;Sit to Supine;Scooting to [facility] Rolling Right: 1: +1 Total assist Rolling Right Details: Tactile cues for initiation;Tactile cues for sequencing;Tactile cues for placement;Verbal cues for sequencing;Verbal cues for technique Rolling Left: 1: +1 Total assist Rolling Left Details: Tactile cues for initiation;Tactile cues for sequencing;Verbal cues for sequencing;Verbal cues for technique;Tactile cues for placement Supine to Sit: 1: +2 Total assist;HOB flat Supine to Sit: Patient Percentage: 0% Supine to Sit Details: Tactile cues for initiation;Tactile cues for sequencing;Tactile cues for placement;Verbal cues for sequencing;Verbal cues for technique Sit to Supine: 1: +2 Total assist Sit to Supine - Details: Tactile cues for initiation;Tactile cues for sequencing;Tactile cues for placement;Verbal cues for sequencing;Verbal cues for technique Scooting to [facility] The: 1: +2 Total assist Scooting to [facility] Details: Tactile cues for initiation;Tactile cues for sequencing;Tactile cues for placement;Verbal cues for sequencing;Verbal cues for technique Transfers Transfers: Yes Squat Pivot Transfers: 1: +2 Total assist Squat Pivot Transfer Details: Tactile cues for initiation;Tactile cues for sequencing;Tactile cues for placement;Verbal cues for technique;Verbal cues for sequencing  See FIM for current functional status  Therapy/Group: Individual Therapy  Carter Kaman 03/23/2014, 6:05 PM

## 2014-03-23 NOTE — Progress Notes (Signed)
Physical Therapy Weekly Progress Note  Patient Details  Name: Sandy Barrett MRN: [id number] Date of Birth: August 19, 1932  Beginning of progress report period: March 14, 2014 End of progress report period: March 18, 2014  Patient has met 0 of 5 short term goals. Although FIM assist level is grossly unchanged since PT evaluation, pt has demonstrated the following functional improvements: increased initiation of functional transfers, increased functional RLE muscle activation, significant improvement in postural control in seated, and increased attention to R visual field, to R hemi body during functional activities/mobility.  Patient continues to demonstrate the following deficits: muscle weakness, impaired timing and sequencing, abnormal tone, unbalanced muscle activation, motor apraxia, decreased coordination and decreased motor planning, decreased midline orientation, decreased attention to right and ideational apraxia, decreased initiation, decreased attention, decreased awareness, decreased problem solving, decreased safety awareness, decreased memory and delayed processing and decreased sitting balance, decreased standing balance, decreased postural control, hemiplegia and decreased balance strategies and therefore will continue to benefit from skilled PT intervention to enhance overall performance with activity tolerance, balance, postural control, ability to compensate for deficits, functional use of  right upper extremity and right lower extremity and attention to R side of body during functional mobility,.  Patient not progressing toward long term goals.  See goal revision..  Plan of care revisions: Upgraded long term goal for sitting balance; downgraded long term goal for bed mobility.  PT Short Term Goals Week 1:  PT Short Term Goal 1 (Week 1): Pt will perform supine > sit with max A with HOB flat using rail with 75% cueing. PT Short Term Goal 1 - Progress (Week 1): Progressing toward  goal PT Short Term Goal 2 (Week 1): Pt will perform sit > supine with HOB flat requiring max A and 75% cueing. PT Short Term Goal 2 - Progress (Week 1): Progressing toward goal PT Short Term Goal 3 (Week 1): Pt will consistently transfer from bed <> w/c with Max A of single therapist with 75% cueing. PT Short Term Goal 3 - Progress (Week 1): Progressing toward goal PT Short Term Goal 4 (Week 1):  Pt will perform w/c mobility x25' in controlled environment with Max A and 75% cueing for visual scanning/attention to R visual field. PT Short Term Goal 4 - Progress (Week 1): Discontinued (comment) (Discontinued until tilt-in-space modified to standard w/c) PT Short Term Goal 5 (Week 1):  (Modified goal from "dynamic standing" to "sitting" due to error in original goal set.) PT Short Term Goal 5 - Progress (Week 1): Revised due to lack of progress Week 2:  PT Short Term Goal 1 (Week 2): Pt will perform supine > sit with max A and 75% cueing with HOB flat using rail. PT Short Term Goal 2 (Week 2): Pt will perform sit > supine with max A and 75% cueing with HOB flat. PT Short Term Goal 3 (Week 2): Pt will consistently transfer from bed <> w/c with max A and 75% cueing. PT Short Term Goal 4 (Week 2): Pt will perform dynamic standing balance x3 consecutive minutes with LE support, without UE support requiring SBA and 25% cueing.  Therapy Documentation Precautions:  Precautions Precautions: Fall Precaution Comments: [name] Restrictions Weight Bearing Restrictions: No Other Position/Activity Restrictions: R shoulder with sublux  See FIM for current functional status  Therapy/Group: Individual Therapy  [name], [name] 03/23/2014, 11:13 PM

## 2014-03-23 NOTE — Plan of Care (Signed)
Problem: RH Balance Goal: LTG Patient will maintain dynamic sitting balance (PT) LTG: Patient will maintain dynamic sitting balance with assistance during mobility activities (PT)  Upgraded due to pt progress.

## 2014-03-23 NOTE — Progress Notes (Signed)
Occupational Therapy Session Note  Patient Details  Name: Tene Gato MRN: [id number] Date of Birth: 02/28/1932  Today's Date: 03/23/2014 OT Individual Time: 1305-1405 OT Individual Time Calculation (min): 60 min    Short Term Goals: Week 2:  OT Short Term Goal 1 (Week 2): Pt will complete bathing with mod assist and mod cues for initiation OT Short Term Goal 2 (Week 2): Pt will complete LB dressing with max assist of one caregiver with mod cues for initiation OT Short Term Goal 3 (Week 2): Pt will complete UB dressing with mod assist and mod cues for initiation OT Short Term Goal 4 (Week 2): Pt will complete toilet transfer with max assist of one caregiver   Skilled Therapeutic Interventions/Progress Updates:    Therapy session with focus on initiation, attention to RUE, and transfers.  Pt seated in w/c upon arrival.  Discussed with daughter kinesiotaping for edema management in Rt hand with daughter willing to attempt.  Kinesiotape affixed to pt's Rt fingers and hand for edema management with pt nodding understanding of discussion.  Performed toilet transfer with use of Stedy with pt demonstrating increased understanding of process with lifting Lt foot to place on footplate and reaching towards bar to promote anterior weight shift as needed for standing.  +2 for sit > stand with 2nd person mostly managing RUE in safe position.  Noted increased upright standing during transfer and clothing management.  Pt continent of bowel and bladder on BSC.  Upon return to w/c attempted to engage pt in simple sorting task by color with pt shaking head "no" with each option and cue.  Discussed with daughter bringing in some items that may motivate the pt to increase participation.  Therapy Documentation Precautions:  Precautions Precautions: Fall Precaution Comments: [name] Restrictions Weight Bearing Restrictions: No Other Position/Activity Restrictions: R shoulder with  sublux General:   Vital Signs: Therapy Vitals Temp: 98.6 F (37 C) Temp Source: Oral Pulse Rate: 83 Resp: 18 BP: 91/60 mmHg Patient Position (if appropriate): Sitting Oxygen Therapy SpO2: 98 % Pain: Pain Assessment Pain Assessment: Faces Faces Pain Scale: No hurt  See FIM for current functional status  Therapy/Group: Individual Therapy  Simonne Come 03/23/2014, 3:17 PM

## 2014-03-23 NOTE — Progress Notes (Signed)
ANTICOAGULATION CONSULT NOTE - Follow Up Consult  Pharmacy Consult for Coumadin Indication: pulmonary embolus and DVT  No Known Allergies  Patient Measurements: Height: 5\' 5"  (165.1 cm) Weight: 175 lb 14.8 oz (79.8 kg) IBW/kg (Calculated) : 57 Heparin Dosing Weight:   Vital Signs: Temp: 98.6 F (37 C) (03/18 1424) Temp Source: Oral (03/18 1424) BP: 91/60 mmHg (03/18 1424) Pulse Rate: 83 (03/18 1424)  Labs:  Recent Labs  03/21/14 0705 03/22/14 0753 03/23/14 0650  HGB 9.5* 9.6* 9.1*  HCT 30.4* 31.3* 29.7*  PLT 158 136* 162  LABPROT 22.9* 26.1* 28.1*  INR 2.01* 2.37* 2.60*  CREATININE 0.40*  --   --     Estimated Creatinine Clearance: 59.5 mL/min (by C-G formula based on Cr of 0.4).   Medications:  Scheduled:  . antiseptic oral rinse  7 mL Mouth Rinse BID  . chlorhexidine  15 mL Mouth Rinse BID  . diclofenac sodium  2 g Topical QID  . feeding supplement (ENSURE)  1 Container Oral TID BM  . iron polysaccharides  150 mg Oral BID AC  . pantoprazole sodium  40 mg Per Tube BID  . Warfarin - Pharmacist Dosing Inpatient   Does not apply q1800    Assessment: 81yo female with DVT and PE, as well as L-MCA infarct with approval for anticoagulation by neuro on 3/10.  INR is now therapeutic, up to 2.6 today.  Pltc wnl and Hg stable.  No bleeding noted.  Lovenox stopped on 3/17 with therapeutic INR.  Goal of Therapy:  INR 2-3 Monitor platelets by anticoagulation protocol: Yes   Plan:  Coumadin 4mg  today Watch CBC Daily INR  Gracy Bruins, PharmD Clinical Pharmacist [facility]

## 2014-03-24 ENCOUNTER — Inpatient Hospital Stay (HOSPITAL_COMMUNITY): Payer: Medicare Other | Admitting: Speech Pathology

## 2014-03-24 LAB — PROTIME-INR
INR: 2.76 — ABNORMAL HIGH (ref 0.00–1.49)
Prothrombin Time: 29.4 seconds — ABNORMAL HIGH (ref 11.6–15.2)

## 2014-03-24 MED ORDER — WARFARIN SODIUM 4 MG PO TABS
4.0000 mg | ORAL_TABLET | Freq: Every day | ORAL | Status: DC
  Administered 2014-03-24 – 2014-03-30 (×7): 4 mg via ORAL
  Filled 2014-03-24 (×8): qty 1

## 2014-03-24 NOTE — Progress Notes (Signed)
Speech Language Pathology Daily Session Note  Patient Details  Name: Sandy Barrett MRN: [id number] Date of Birth: 1932/07/21  Today's Date: 03/24/2014 SLP Individual Time: 5621-3086 SLP Individual Time Calculation (min): 72 min  Short Term Goals: Week 2: SLP Short Term Goal 1 (Week 2): Pt will improve accuracy of yes/no responses to basic, biographical questions to >50% with max assist multimodal cuing.   SLP Short Term Goal 2 (Week 2): Pt will improve focused attention to therapist or during basic, familiar tasks for 30-45 seconds with max assist.  SLP Short Term Goal 3 (Week 2): Pt will complete verbal automatic sequences for 25-50% accuracy with max assist multimodal cues.   SLP Short Term Goal 4 (Week 2): Pt will scan to the right of midline during basic, familiar tasks over ~50% of observable opportunities with max multimodal cues.   SLP Short Term Goal 5 (Week 2): Pt will follow 1 step commands during basic, familiar tasks for 50% accuracy with max assist multimodal cuing.   SLP Short Term Goal 6 (Week 2): Pt will tolerate presentations of her currently prescribed diet with minimal overt s/s of aspiration and max assist for use of safe swallowing precautions.  Skilled Therapeutic Interventions:   Pt was seen for skilled ST targeting goals for functional communication and dysphagia.  Upon arrival, pt was seated upright in wheelchair, awake, lethargic but pleasant.  SLP facilitated the session with trials of honey thick liquids to continue working towards liquids advancement with pt demonstrating minimal right anterior loss of materials due to decreased labial seal which she corrected with mod verbal and tactile cues, no overt s/s of aspiration.  Pt may be ready for trial meal tray of dys 1 textures with upgraded honey thick liquids to assess readiness for liquids progression.  Pt continues to require total assist to match picture to object from a field of three but was mod assist for  following 1-step commands in a functional and familiar context during receptive language tasks.  Pt completed automatic sequences and repeating familiar phrases and remains able to approximate <25% of vowels in the middle of words and >25% of initial bilabial consonants with repetitive practice and phonemic placement cues.  Pt's daughters presented with questions regarding techniques to maximize pt's functional communication.  SLP provided education regarding accessing pt's most intact modalities and positively reinforcing pt's attempts to initiate communication to make needs/wants known.  Continue per current plan of care   FIM:  Comprehension Comprehension Mode: Auditory Comprehension: 2-Understands basic 25 - 49% of the time/requires cueing 51 - 75% of the time Expression Expression Mode: Verbal;Nonverbal Expression: 1-Expresses basis less than 25% of the time/requires cueing greater than 75% of the time. Social Interaction Social Interaction: 2-Interacts appropriately 25 - 49% of time - Needs frequent redirection. Problem Solving Problem Solving: 2-Solves basic 25 - 49% of the time - needs direction more than half the time to initiate, plan or complete simple activities Memory Memory: 1-Recognizes or recalls less than 25% of the time/requires cueing greater than 75% of the time FIM - Eating Eating Activity: 4: Helper occasionally scoops food on utensil;2: Hand over hand assist;4: Helper occasionally brings food to mouth;4: Helper checks for pocketed food;5: Set-up assist for open containers;5: Needs verbal cues/supervision  Pain Pain Assessment Pain Assessment: No/denies pain  Therapy/Group: Individual Therapy  Jamell Opfer, Selinda Orion 03/24/2014, 4:16 PM

## 2014-03-24 NOTE — Progress Notes (Signed)
ANTICOAGULATION CONSULT NOTE - Follow Up Consult  Pharmacy Consult for Coumadin Indication: pulmonary embolus and DVT  No Known Allergies  Patient Measurements: Height: 5\' 5"  (165.1 cm) Weight: 175 lb 14.8 oz (79.8 kg) IBW/kg (Calculated) : 57  Vital Signs: Temp: 98.3 F (36.8 C) (03/19 0602) Temp Source: Oral (03/19 0602) BP: 92/50 mmHg (03/19 0602) Pulse Rate: 82 (03/19 0602)  Labs:  Recent Labs  03/22/14 0753 03/23/14 0650 03/24/14 0547  HGB 9.6* 9.1*  --   HCT 31.3* 29.7*  --   PLT 136* 162  --   LABPROT 26.1* 28.1* 29.4*  INR 2.37* 2.60* 2.76*    Estimated Creatinine Clearance: 59.5 mL/min (by C-G formula based on Cr of 0.4).   Assessment: 81yo female with DVT and PE, as well as L-MCA infarct with approval for anticoagulation by neuro on 3/10.  INR is therapeutic at 2.76 today.  Pltc wnl and Hg stable on 3/18.  No bleeding noted.  Lovenox stopped on 3/17 with therapeutic INR.  SRA negative for HIT 3/14  Goal of Therapy:  INR 2-3   Plan:  Coumadin 4mg  daily decrease INR to MWF only  Eudelia Bunch, Pharm.D. [phone] 03/24/2014 10:40 AM

## 2014-03-24 NOTE — Progress Notes (Signed)
BLE dopplers revealed DVT R-distal CFV, FV, popliteal vein and DVT L-FV and popliteal vein.  2D echo revealed EF 65-70% with interatrial septal aneurysm.   Protein C, Protein S and lupus anticoagulant negative. Surgery consult recommended for work up once patient stable and question of thyroid malignancy.      \Subjective/Complaints: No distress. No problems overnight per RN.   ROS: cannot obtain due to aphasia Objective: Vital Signs: Blood pressure 92/50, pulse 82, temperature 98.3 F (36.8 C), temperature source Oral, resp. rate 18, height 5\' 5"  (1.651 m), weight 79.8 kg (175 lb 14.8 oz), SpO2 96 %. No results found. Results for orders placed or performed during the hospital encounter of 03/13/14 (from the past 72 hour(s))  Protime-INR     Status: Abnormal   Collection Time: 03/22/14  7:53 AM  Result Value Ref Range   Prothrombin Time 26.1 (H) 11.6 - 15.2 seconds   INR 2.37 (H) 0.00 - 1.49  CBC     Status: Abnormal   Collection Time: 03/22/14  7:53 AM  Result Value Ref Range   WBC 6.1 4.0 - 10.5 K/uL   RBC 3.21 (L) 3.87 - 5.11 MIL/uL   Hemoglobin 9.6 (L) 12.0 - 15.0 g/dL   HCT 31.3 (L) 36.0 - 46.0 %   MCV 97.5 78.0 - 100.0 fL   MCH 29.9 26.0 - 34.0 pg   MCHC 30.7 30.0 - 36.0 g/dL   RDW 19.2 (H) 11.5 - 15.5 %   Platelets 136 (L) 150 - 400 K/uL  Occult blood card to lab, stool RN will collect     Status: Abnormal   Collection Time: 03/22/14  1:34 PM  Result Value Ref Range   Fecal Occult Bld POSITIVE (A) NEGATIVE  Protime-INR     Status: Abnormal   Collection Time: 03/23/14  6:50 AM  Result Value Ref Range   Prothrombin Time 28.1 (H) 11.6 - 15.2 seconds   INR 2.60 (H) 0.00 - 1.49  CBC     Status: Abnormal   Collection Time: 03/23/14  6:50 AM  Result Value Ref Range   WBC 6.1 4.0 - 10.5 K/uL   RBC 3.05 (L) 3.87 - 5.11 MIL/uL   Hemoglobin 9.1 (L) 12.0 - 15.0 g/dL   HCT 29.7 (L) 36.0 - 46.0 %   MCV 97.4 78.0 - 100.0 fL   MCH 29.8 26.0 - 34.0 pg   MCHC 30.6 30.0 - 36.0  g/dL   RDW 19.2 (H) 11.5 - 15.5 %   Platelets 162 150 - 400 K/uL  Protime-INR     Status: Abnormal   Collection Time: 03/24/14  5:47 AM  Result Value Ref Range   Prothrombin Time 29.4 (H) 11.6 - 15.2 seconds   INR 2.76 (H) 0.00 - 1.49     HEENT: tongue midline,  neck vein prominence Cardio: RRR and no murmur Resp: CTA B/L and unlabored GI: BS positive and NT< ND Extremity:  Edema LUE> LLE Skin:   Bruise multiple ecchymotic areas BUE Neuro: alert, Cranial Nerve Abnormalities RIght central 7, Abnormal Sensory cannot assess due to aphasia, Abnormal Motor 0/5 RUE, 0/5  RLE, antigravity on Left but has receptive difficulties and apraxia limiting MMT, Dysarthric, Aphasic and Apraxic. Seems a little more accurate with YES/NO answers Musc/Skel:  No pain with RUE ROM Gen NAD,    Assessment/Plan: 1. Functional deficits secondary to Large left MCA infarct embolic which require 3+ hours per day of interdisciplinary therapy in a comprehensive inpatient rehab setting. Physiatrist is providing  close team supervision and 24 hour management of active medical problems listed below. Physiatrist and rehab team continue to assess barriers to discharge/monitor patient progress toward functional and medical goals.  FIM: FIM - Bathing Bathing Steps Patient Completed: Chest Bathing: 1: Two helpers  FIM - Upper Body Dressing/Undressing Upper body dressing/undressing steps patient completed: Thread/unthread left bra strap, Thread/unthread left sleeve of pullover shirt/dress Upper body dressing/undressing: 1: Total-Patient completed less than 25% of tasks FIM - Lower Body Dressing/Undressing Lower body dressing/undressing: 1: Two helpers  FIM - Toileting Toileting: 1: Two helpers  FIM - Radio producer Devices: Engineer, civil (consulting) Customer service manager) Toilet Transfers: 1-Mechanical lift, 1-Two helpers  FIM - Control and instrumentation engineer Devices: Sliding board, Arm  rests Bed/Chair Transfer: 1: Two helpers, 1: Supine > Sit: Total A (helper does all/Pt. < 25%), 1: Sit > Supine: Total A (helper does all/Pt. < 25%)  FIM - Locomotion: Wheelchair Locomotion: Wheelchair: 0: Activity did not occur FIM - Locomotion: Ambulation Locomotion: Ambulation: 0: Activity did not occur  Comprehension Comprehension Mode: Auditory Comprehension: 2-Understands basic 25 - 49% of the time/requires cueing 51 - 75% of the time  Expression Expression Mode: Nonverbal, Verbal Expression Assistive Devices:  (head nods) Expression: 1-Expresses basis less than 25% of the time/requires cueing greater than 75% of the time.  Social Interaction Social Interaction: 2-Interacts appropriately 25 - 49% of time - Needs frequent redirection.  Problem Solving Problem Solving: 2-Solves basic 25 - 49% of the time - needs direction more than half the time to initiate, plan or complete simple activities  Memory Memory Mode: Not assessed (unable to assess because of aphasia) Memory: 1-Recognizes or recalls less than 25% of the time/requires cueing greater than 75% of the time  Medical Problem List and Plan: 1. Functional deficits secondary to Large L-MCA infarct with dense right hemiplegia, dysphagia and global aphasia.   2.  DVT Prophylaxis/Anticoagulation: Pharmaceutical  Coumadin for DVT 3. Pain Management: tramadol, tylenol, appropriate turning in bed, regular checks for comfort 4. Mood: LCSW to follow for evaluation and support.   5. Neuropsych: This patient is not capable of making decisions on her own behalf. 6. Skin/Wound Care:  Turn patient every 4 hours.  Rehab RN to monitor skin daily.  Maintain adequate nutrition and hydration status.   7. Fluids/Electrolytes/Nutrition:  Monitor I/O.Off IVF recheck BMET in am,per daughter eating better and taking thickened liquids -encourage po, full supervision with feeding 8. Hypertension: normotensive at present 9.  Thyroid mass question  carcinoma, further eval as pt recovers from severe CVA 10.  Hx bilateral knee OA, saw ortho as outpt, diclofenac gel 11.  Thrombocytopenia stable- bruising but no obvious hematoma formation   LOS (Days) 11 A FACE TO FACE EVALUATION WAS PERFORMED  Janilah Hojnacki T 03/24/2014, 9:20 AM

## 2014-03-25 ENCOUNTER — Inpatient Hospital Stay (HOSPITAL_COMMUNITY): Payer: Medicare Other | Admitting: Physical Therapy

## 2014-03-25 ENCOUNTER — Inpatient Hospital Stay (HOSPITAL_COMMUNITY): Payer: Medicare Other | Admitting: *Deleted

## 2014-03-25 LAB — URINALYSIS, ROUTINE W REFLEX MICROSCOPIC
Bilirubin Urine: NEGATIVE
Glucose, UA: NEGATIVE mg/dL
Ketones, ur: NEGATIVE mg/dL
Nitrite: POSITIVE — AB
Protein, ur: NEGATIVE mg/dL
Specific Gravity, Urine: 1.011 (ref 1.005–1.030)
Urobilinogen, UA: 0.2 mg/dL (ref 0.0–1.0)
pH: 7.5 (ref 5.0–8.0)

## 2014-03-25 LAB — PROTIME-INR
INR: 2.7 — ABNORMAL HIGH (ref 0.00–1.49)
Prothrombin Time: 28.9 seconds — ABNORMAL HIGH (ref 11.6–15.2)

## 2014-03-25 LAB — URINE MICROSCOPIC-ADD ON

## 2014-03-25 NOTE — Progress Notes (Signed)
BLE dopplers revealed DVT R-distal CFV, FV, popliteal vein and DVT L-FV and popliteal vein.  2D echo revealed EF 65-70% with interatrial septal aneurysm.   Protein C, Protein S and lupus anticoagulant negative. Surgery consult recommended for work up once patient stable and question of thyroid malignancy.      \Subjective/Complaints: Had a fair night, perhaps a little restless. strong odor of urine in room.   ROS: cannot obtain due to aphasia Objective: Vital Signs: Blood pressure 115/39, pulse 86, temperature 97.8 F (36.6 C), temperature source Oral, resp. rate 17, height 5\' 5"  (1.651 m), weight 79.8 kg (175 lb 14.8 oz), SpO2 100 %. No results found. Results for orders placed or performed during the hospital encounter of 03/13/14 (from the past 72 hour(s))  Occult blood card to lab, stool RN will collect     Status: Abnormal   Collection Time: 03/22/14  1:34 PM  Result Value Ref Range   Fecal Occult Bld POSITIVE (A) NEGATIVE  Protime-INR     Status: Abnormal   Collection Time: 03/23/14  6:50 AM  Result Value Ref Range   Prothrombin Time 28.1 (H) 11.6 - 15.2 seconds   INR 2.60 (H) 0.00 - 1.49  CBC     Status: Abnormal   Collection Time: 03/23/14  6:50 AM  Result Value Ref Range   WBC 6.1 4.0 - 10.5 K/uL   RBC 3.05 (L) 3.87 - 5.11 MIL/uL   Hemoglobin 9.1 (L) 12.0 - 15.0 g/dL   HCT 29.7 (L) 36.0 - 46.0 %   MCV 97.4 78.0 - 100.0 fL   MCH 29.8 26.0 - 34.0 pg   MCHC 30.6 30.0 - 36.0 g/dL   RDW 19.2 (H) 11.5 - 15.5 %   Platelets 162 150 - 400 K/uL  Protime-INR     Status: Abnormal   Collection Time: 03/24/14  5:47 AM  Result Value Ref Range   Prothrombin Time 29.4 (H) 11.6 - 15.2 seconds   INR 2.76 (H) 0.00 - 1.49  Protime-INR     Status: Abnormal   Collection Time: 03/25/14  7:20 AM  Result Value Ref Range   Prothrombin Time 28.9 (H) 11.6 - 15.2 seconds   INR 2.70 (H) 0.00 - 1.49     HEENT: tongue midline,  neck vein prominence Cardio: RRR and no murmur Resp: CTA B/L and  unlabored GI: BS positive and NT< ND Extremity:  Edema LUE> LLE Skin:   Bruise multiple ecchymotic areas BUE Neuro: alert, Cranial Nerve Abnormalities RIght central 7, Abnormal Sensory cannot assess due to aphasia, Abnormal Motor 0/5 RUE, 0/5  RLE, antigravity on Left but has receptive difficulties and apraxia limiting MMT, Dysarthric, Aphasic and Apraxic. Seems a little more accurate with YES/NO answers Musc/Skel:  No pain with RUE ROM Gen NAD,    Assessment/Plan: 1. Functional deficits secondary to Large left MCA infarct embolic which require 3+ hours per day of interdisciplinary therapy in a comprehensive inpatient rehab setting. Physiatrist is providing close team supervision and 24 hour management of active medical problems listed below. Physiatrist and rehab team continue to assess barriers to discharge/monitor patient progress toward functional and medical goals.  FIM: FIM - Bathing Bathing Steps Patient Completed: Chest Bathing: 1: Two helpers  FIM - Upper Body Dressing/Undressing Upper body dressing/undressing steps patient completed: Thread/unthread left bra strap, Thread/unthread left sleeve of pullover shirt/dress Upper body dressing/undressing: 1: Total-Patient completed less than 25% of tasks FIM - Lower Body Dressing/Undressing Lower body dressing/undressing: 1: Two helpers  FIM -  Toileting Toileting: 1: Two helpers  FIM - Radio producer Devices: Engineer, civil (consulting) Customer service manager) Toilet Transfers: Manufacturing engineer, 1-Two helpers  FIM - Control and instrumentation engineer Devices: Sliding board, Arm rests Bed/Chair Transfer: 1: Two helpers, 1: Supine > Sit: Total A (helper does all/Pt. < 25%), 1: Sit > Supine: Total A (helper does all/Pt. < 25%)  FIM - Locomotion: Wheelchair Locomotion: Wheelchair: 0: Activity did not occur FIM - Locomotion: Ambulation Locomotion: Ambulation: 0: Activity did not occur  Comprehension Comprehension  Mode: Auditory Comprehension: 2-Understands basic 25 - 49% of the time/requires cueing 51 - 75% of the time  Expression Expression Mode: Verbal, Nonverbal Expression Assistive Devices:  (head nods) Expression: 1-Expresses basis less than 25% of the time/requires cueing greater than 75% of the time.  Social Interaction Social Interaction: 2-Interacts appropriately 25 - 49% of time - Needs frequent redirection.  Problem Solving Problem Solving: 2-Solves basic 25 - 49% of the time - needs direction more than half the time to initiate, plan or complete simple activities  Memory Memory Mode: Not assessed (unable to assess because of aphasia) Memory: 1-Recognizes or recalls less than 25% of the time/requires cueing greater than 75% of the time  Medical Problem List and Plan: 1. Functional deficits secondary to Large L-MCA infarct with dense right hemiplegia, dysphagia and global aphasia.   2.  DVT Prophylaxis/Anticoagulation: Pharmaceutical  Coumadin for DVT 3. Pain Management: tramadol, tylenol, appropriate turning in bed, regular checks for comfort 4. Mood: LCSW to follow for evaluation and support.   5. Neuropsych: This patient is not capable of making decisions on her own behalf. 6. Skin/Wound Care:  Turn patient every 4 hours.  Rehab RN to monitor skin daily.  Maintain adequate nutrition and hydration status.   7. Fluids/Electrolytes/Nutrition:  Monitor I/O. generally eating better and taking thickened liquids -encourage po, full supervision with feeding 8. Hypertension: normotensive at present 9.  Thyroid mass question carcinoma, further eval as pt recovers from severe CVA 10.  Hx bilateral knee OA, saw ortho as outpt, diclofenac gel 11.  Thrombocytopenia stable- bruising but no obvious hematoma formation 12. Low grade temp: check ua/cx   LOS (Days) 12 A FACE TO FACE EVALUATION WAS PERFORMED  Trestan Vahle T 03/25/2014, 8:37 AM

## 2014-03-25 NOTE — Progress Notes (Addendum)
ANTICOAGULATION CONSULT NOTE - Follow Up Consult  Pharmacy Consult for Coumadin Indication: pulmonary embolus and DVT  No Known Allergies  Patient Measurements: Height: 5\' 5"  (165.1 cm) Weight: 175 lb 14.8 oz (79.8 kg) IBW/kg (Calculated) : 57  Vital Signs: Temp: 97.8 F (36.6 C) (03/20 0541) Temp Source: Oral (03/20 0541) BP: 115/39 mmHg (03/20 0541) Pulse Rate: 86 (03/20 0541)  Labs:  Recent Labs  03/23/14 0650 03/24/14 0547 03/25/14 0720  HGB 9.1*  --   --   HCT 29.7*  --   --   PLT 162  --   --   LABPROT 28.1* 29.4* 28.9*  INR 2.60* 2.76* 2.70*    Estimated Creatinine Clearance: 59.5 mL/min (by C-G formula based on Cr of 0.4).   Assessment: 81yo female with DVT and PE, as well as L-MCA infarct with approval for anticoagulation by neuro on 3/10.  INR is therapeutic at 2.7 today.  Pltc wnl and Hg stable on 3/18.  No bleeding noted.  Lovenox stopped on 3/17 with therapeutic INR.  SRA negative for HIT 3/14  Goal of Therapy:  INR 2-3   Plan:  Coumadin 4mg  daily decrease INR to TTS only, next 3/22 I changed MD ordered CBC from MWF to TTS to reduce blood draws  Eudelia Bunch, Pharm.D. [phone] 03/25/2014 8:58 AM

## 2014-03-25 NOTE — Progress Notes (Signed)
Physical Therapy Session Note  Patient Details  Name: Sandy Barrett MRN: [id number] Date of Birth: Jun 10, 1932  Today's Date: 03/25/2014 PT Individual Time: 1540-0867 PT Individual Time Calculation (min): 45 min   Short Term Goals: Week 1:  PT Short Term Goal 1 (Week 1): Pt will perform supine > sit with max A with HOB flat using rail with 75% cueing. PT Short Term Goal 1 - Progress (Week 1): Progressing toward goal PT Short Term Goal 2 (Week 1): Pt will perform sit > supine with HOB flat requiring max A and 75% cueing. PT Short Term Goal 2 - Progress (Week 1): Progressing toward goal PT Short Term Goal 3 (Week 1): Pt will consistently transfer from bed <> w/c with Max A of single therapist with 75% cueing. PT Short Term Goal 3 - Progress (Week 1): Progressing toward goal PT Short Term Goal 4 (Week 1):  Pt will perform w/c mobility x25' in controlled environment with Max A and 75% cueing for visual scanning/attention to R visual field. PT Short Term Goal 4 - Progress (Week 1): Discontinued (comment) (Discontinued until tilt-in-space modified to standard w/c) PT Short Term Goal 5 (Week 1):  (Modified goal from "dynamic standing" to "sitting" due to error in original goal set.) PT Short Term Goal 5 - Progress (Week 1): Revised due to lack of progress Week 2:  PT Short Term Goal 1 (Week 2): Pt will perform supine > sit with max A and 75% cueing with HOB flat using rail. PT Short Term Goal 2 (Week 2): Pt will perform sit > supine with max A and 75% cueing with HOB flat. PT Short Term Goal 3 (Week 2): Pt will consistently transfer from bed <> w/c with max A and 75% cueing. PT Short Term Goal 4 (Week 2): Pt will perform dynamic standing balance x3 consecutive minutes with LE support, without UE support requiring SBA and 25% cueing.  Skilled Therapeutic Interventions/Progress Updates:    Pt limited at times in session by aphasia and decreased attention, particularly in structured tasks. Pt does  best when engaging in activities with self relevance like looking a grocery circulars. Pt able to activate R side in forced use tasks with LUE on airex. Pt would continue to benefit from skilled PT services to increase functional mobility.    Therapy Documentation Precautions:  Precautions Precautions: Fall Precaution Comments: [name] Restrictions Weight Bearing Restrictions: No Other Position/Activity Restrictions: R shoulder with sublux Pain: Pain Assessment Pain Assessment: No/denies pain Mobility:  Max A sit to stands, Max A squat pivot with and without slideboard with SBA of another with cues for safety, attention, and weight shift.  Locomotion :   Unsafe to attempt Other Treatments:  Pt performs transfer training sit to stand 2x3 in session. Pt performs partial sit to stands x20 in session. Forced RUE use on airex x15' with LUE reaching, grasping, and weight shifting activities. Pt Pt performs active rest with unsupported sitting balance. Pt performs static standing without AD, with w/c, with SBQC 30"x2 with cues for posture. Pt with decreased attention redirected with self relevant stim. Frequent cog rest also provided as needed.   See FIM for current functional status  Therapy/Group: Individual Therapy  Monia Pouch 03/25/2014, 9:56 AM

## 2014-03-26 ENCOUNTER — Inpatient Hospital Stay (HOSPITAL_COMMUNITY): Payer: Medicare Other | Admitting: Physical Therapy

## 2014-03-26 ENCOUNTER — Inpatient Hospital Stay (HOSPITAL_COMMUNITY): Payer: Medicare Other | Admitting: Speech Pathology

## 2014-03-26 ENCOUNTER — Inpatient Hospital Stay (HOSPITAL_COMMUNITY): Payer: Medicare Other | Admitting: Occupational Therapy

## 2014-03-26 MED ORDER — CEPHALEXIN 250 MG PO CAPS
250.0000 mg | ORAL_CAPSULE | Freq: Three times a day (TID) | ORAL | Status: AC
  Administered 2014-03-26 – 2014-04-02 (×22): 250 mg via ORAL
  Filled 2014-03-26 (×24): qty 1

## 2014-03-26 NOTE — Progress Notes (Signed)
NUTRITION FOLLOW UP  INTERVENTION: Continue Ensure Pudding po TID, each supplement provides 170 kcal and 4 grams of protein.  Continue Magic cup TID with meals, each supplement provides 290 kcal and 9 grams of protein.  Encourage adequate PO intake.  NUTRITION DIAGNOSIS: Inadequate oral intake related to dislike of puree diet as evidenced by meal completion of 20-35%; ongoing  Goal: Pt to meet >/= 90% of their estimated nutrition needs; progressing  Monitor:  PO intake, weight trends, labs, I/O's  81 y.o. female  Admitting Dx: Cerebral infarction due to embolism of left middle cerebral artery  ASSESSMENT: Pt history of HTN, OA, Migraines, hyperlipidemia who was admitted on 03/08/14 with right sided weakness, difficulty talking and right facial droop. MRI brain done revealing patchy areas of acute infarct L-MCA territory and severe punctate foci in R-MCA territory with possible trace SAH in high right frontal lobe. MRA brain with occluded L-ICA and L-MCA.   Pt is on a dysphagia 1 diet with pudding thick liquids. Meal completion has been improving with 50-75%. Pt has been consuming her Ensure pudding and Magic cup. Will continue with current orders.   Labs and medications reviewed.  Height: Ht Readings from Last 1 Encounters:  03/14/14 5\' 5"  (1.651 m)    Weight: Wt Readings from Last 1 Encounters:  03/21/14 175 lb 14.8 oz (79.8 kg)    BMI:  Body mass index is 29.28 kg/(m^2).  Re-Estimated Nutritional Needs: Kcal: 1800-2000 Protein: 90-100 grams Fluid: 1.8 - 2 L/day  Skin: Incision on right groin, +1 RUE edema, non pitting RLE, LLE edema  Diet Order: DIET - DYS 1 with pudding thickened liquids   Intake/Output Summary (Last 24 hours) at 03/26/14 1404 Last data filed at 03/26/14 1353  Gross per 24 hour  Intake    490 ml  Output      0 ml  Net    490 ml    Last BM: 3/20  Labs:   Recent Labs Lab 03/21/14 0705  NA 142  K 3.9  CL 109  CO2 25  BUN 8   CREATININE 0.40*  CALCIUM 8.9  GLUCOSE 89    CBG (last 3)  No results for input(s): GLUCAP in the last 72 hours.  Scheduled Meds: . antiseptic oral rinse  7 mL Mouth Rinse BID  . cephALEXin  250 mg Oral 3 times per day  . chlorhexidine  15 mL Mouth Rinse BID  . diclofenac sodium  2 g Topical QID  . feeding supplement (ENSURE)  1 Container Oral TID BM  . iron polysaccharides  150 mg Oral BID AC  . pantoprazole sodium  40 mg Per Tube BID  . warfarin  4 mg Oral q1800  . Warfarin - Pharmacist Dosing Inpatient   Does not apply q1800    Continuous Infusions:    Past Medical History  Diagnosis Date  . HYPERTENSION   . HYPERTHYROIDISM   . OBESITY   . ARTHRITIS   . Posttraumatic stress disorder   . INSOMNIA   . MIGRAINE HEADACHE   . Hyperthyroidism     s/p I-131 ablation 03/2011 of multinod goiter  . Arthritis     Past Surgical History  Procedure Laterality Date  . Abdominal hysterectomy  1976  . Total hip arthroplasty  1998     right  . Tonsillectomy    . Breast surgery      biopsy  . Radiology with anesthesia Left 03/08/2014    Procedure: RADIOLOGY WITH ANESTHESIA;  Surgeon: Willaim Rayas  Stephani Police, MD;  Location: [facility];  Service: Radiology;  Laterality: Left;    Kallie Locks, MS, RD, LDN Pager # [phone] After hours/ weekend pager # [phone]

## 2014-03-26 NOTE — Progress Notes (Signed)
Occupational Therapy Session Note  Patient Details  Name: Sandy Barrett MRN: [id number] Date of Birth: 10/23/1932  Today's Date: 03/26/2014 OT Individual Time: 7681-1572 OT Individual Time Calculation (min): 60 min    Short Term Goals: Week 2:  OT Short Term Goal 1 (Week 2): Pt will complete bathing with mod assist and mod cues for initiation OT Short Term Goal 2 (Week 2): Pt will complete LB dressing with max assist of one caregiver with mod cues for initiation OT Short Term Goal 3 (Week 2): Pt will complete UB dressing with mod assist and mod cues for initiation OT Short Term Goal 4 (Week 2): Pt will complete toilet transfer with max assist of one caregiver   Skilled Therapeutic Interventions/Progress Updates:    Engaged in ADL retraining with focus on bed mobility, increased initiation and participation in self-care tasks, and transfers.  Engaged in LB bathing at bed level with focus on rolling to decrease caregiver burden, with pt able to roll to Rt with mod verbal cues and min assist however still requiring +2 to roll to Lt requiring assist to manage flaccid RUE.  Pt continues to demonstrate decreased initiation and impaired ability to follow directions, with perseverating on washing face any time provided with washcloth despite verbal cue.  Noted increased initiation with donning clothes with pt attempting to thread Lt arm and pull shirt over head, but not able to fully complete tasks without assistance.  Also increased initiation with pt lifting LLE for therapist to thread pants and reaching forward to assist in pulling pants over knees.  Sit > stand max assist with +2 to pull pants over hips with 2nd person pulling up pants while therapist provided physical assist to maintain standing balance and blocking at Rt knee.  Prior to standing, donned sling to increase positioning of RUE during mobility which increased ability to complete sit > stand with one person.  Squat pivot bed > w/c with  max cues for sequencing and +2 to maintain forward weight shift to complete transfer.  Pt initiated combing hair with appropriate use of comb, however unable to complete. Attempted to engage in seated task to promote scanning to Rt and initiation with looking through sale ads.    Therapy Documentation Precautions:  Precautions Precautions: Fall Precaution Comments: [name] Restrictions Weight Bearing Restrictions: No Other Position/Activity Restrictions: R shoulder with sublux General:   Vital Signs: Therapy Vitals Temp: 98.4 F (36.9 C) Temp Source: Oral Pulse Rate: 88 Resp: 18 BP: (!) 129/52 mmHg Patient Position (if appropriate): Lying Oxygen Therapy SpO2: 98 % O2 Device: Not Delivered Pain:  Pt with no c/o pain  See FIM for current functional status  Therapy/Group: Individual Therapy  Simonne Come 03/26/2014, 9:31 AM

## 2014-03-26 NOTE — Progress Notes (Signed)
Speech Language Pathology Daily Session Note  Patient Details  Name: Sandy Barrett MRN: [id number] Date of Birth: May 02, 1932  Today's Date: 03/26/2014 SLP Individual Time: 1025-1045 SLP Individual Time Calculation (min): 20 min  Short Term Goals: Week 2: SLP Short Term Goal 1 (Week 2): Pt will improve accuracy of yes/no responses to basic, biographical questions to >50% with max assist multimodal cuing.   SLP Short Term Goal 2 (Week 2): Pt will improve focused attention to therapist or during basic, familiar tasks for 30-45 seconds with max assist.  SLP Short Term Goal 3 (Week 2): Pt will complete verbal automatic sequences for 25-50% accuracy with max assist multimodal cues.   SLP Short Term Goal 4 (Week 2): Pt will scan to the right of midline during basic, familiar tasks over ~50% of observable opportunities with max multimodal cues.   SLP Short Term Goal 5 (Week 2): Pt will follow 1 step commands during basic, familiar tasks for 50% accuracy with max assist multimodal cuing.   SLP Short Term Goal 6 (Week 2): Pt will tolerate presentations of her currently prescribed diet with minimal overt s/s of aspiration and max assist for use of safe swallowing precautions.  Skilled Therapeutic Interventions: Skilled treatment session focused on cognitive-linguistic goals. Upon arrival, patient was awake while sitting upright in the wheelchair and agreeable to participate.  SLP facilitated session by providing Min A visual cues for initiation of basic card task and extra time with Mod A verbal and visual cues to identify the highest number from a field of two, patient completed task with 100% accuracy. SLP also facilitated session by providing Max A demonstration cues for automatic sequences in regards to counting to specific number. Patient was able to approximate ~25% of numbers and was able to end sequence appropriately in 75% of opportunities. Patient left in wheelchair with quick release belt in  place and all needs within reach. Continue with current plan of care.    FIM:  Comprehension Comprehension Mode: Auditory Comprehension: 2-Understands basic 25 - 49% of the time/requires cueing 51 - 75% of the time Expression Expression Mode: Verbal Expression: 1-Expresses basis less than 25% of the time/requires cueing greater than 75% of the time. Social Interaction Social Interaction: 2-Interacts appropriately 25 - 49% of time - Needs frequent redirection. Problem Solving Problem Solving: 2-Solves basic 25 - 49% of the time - needs direction more than half the time to initiate, plan or complete simple activities Memory Memory: 1-Recognizes or recalls less than 25% of the time/requires cueing greater than 75% of the time  Pain Pain Assessment Pain Assessment: No/denies pain  Therapy/Group: Individual Therapy  Sandy Barrett 03/26/2014, 2:22 PM

## 2014-03-26 NOTE — Progress Notes (Signed)
BLE dopplers revealed DVT R-distal CFV, FV, popliteal vein and DVT L-FV and popliteal vein.  2D echo revealed EF 65-70% with interatrial septal aneurysm.   Protein C, Protein S and lupus anticoagulant negative. Surgery consult recommended for work up once patient stable and question of thyroid malignancy.    Subjective/Complaints: No issues overnite ROS: cannot obtain due to aphasia Objective: Vital Signs: Blood pressure 129/52, pulse 88, temperature 98.4 F (36.9 C), temperature source Oral, resp. rate 18, height 5\' 5"  (1.651 m), weight 79.8 kg (175 lb 14.8 oz), SpO2 98 %. No results found. Results for orders placed or performed during the hospital encounter of 03/13/14 (from the past 72 hour(s))  Protime-INR     Status: Abnormal   Collection Time: 03/24/14  5:47 AM  Result Value Ref Range   Prothrombin Time 29.4 (H) 11.6 - 15.2 seconds   INR 2.76 (H) 0.00 - 1.49  Protime-INR     Status: Abnormal   Collection Time: 03/25/14  7:20 AM  Result Value Ref Range   Prothrombin Time 28.9 (H) 11.6 - 15.2 seconds   INR 2.70 (H) 0.00 - 1.49  Urinalysis, Routine w reflex microscopic     Status: Abnormal   Collection Time: 03/25/14  1:53 PM  Result Value Ref Range   Color, Urine YELLOW YELLOW   APPearance CLOUDY (A) CLEAR   Specific Gravity, Urine 1.011 1.005 - 1.030   pH 7.5 5.0 - 8.0   Glucose, UA NEGATIVE NEGATIVE mg/dL   Hgb urine dipstick TRACE (A) NEGATIVE   Bilirubin Urine NEGATIVE NEGATIVE   Ketones, ur NEGATIVE NEGATIVE mg/dL   Protein, ur NEGATIVE NEGATIVE mg/dL   Urobilinogen, UA 0.2 0.0 - 1.0 mg/dL   Nitrite POSITIVE (A) NEGATIVE   Leukocytes, UA LARGE (A) NEGATIVE  Urine microscopic-add on     Status: Abnormal   Collection Time: 03/25/14  1:53 PM  Result Value Ref Range   WBC, UA 11-20 <3 WBC/hpf   Bacteria, UA MANY (A) RARE     HEENT: tongue midline,  neck vein prominence Cardio: RRR and no murmur Resp: CTA B/L and unlabored GI: BS positive and NT< ND Extremity:   Edema LUE> LLE Skin:   Bruise multiple ecchymotic areas BUE Neuro: alert, Cranial Nerve Abnormalities RIght central 7, Abnormal Sensory cannot assess due to aphasia, Abnormal Motor 0/5 RUE, 0/5  RLE, antigravity on Left but has receptive difficulties and apraxia limiting MMT, Dysarthric, MAS 3 in R pec and PT Aphasic and Apraxic Musc/Skel:  No pain with RUE ROM Gen NAD,    Assessment/Plan: 1. Functional deficits secondary to Large left MCA infarct embolic which require 3+ hours per day of interdisciplinary therapy in a comprehensive inpatient rehab setting. Physiatrist is providing close team supervision and 24 hour management of active medical problems listed below. Physiatrist and rehab team continue to assess barriers to discharge/monitor patient progress toward functional and medical goals.  FIM: FIM - Bathing Bathing Steps Patient Completed: Chest Bathing: 1: Two helpers  FIM - Upper Body Dressing/Undressing Upper body dressing/undressing steps patient completed: Thread/unthread left bra strap, Thread/unthread left sleeve of pullover shirt/dress Upper body dressing/undressing: 1: Total-Patient completed less than 25% of tasks FIM - Lower Body Dressing/Undressing Lower body dressing/undressing: 1: Two helpers  FIM - Toileting Toileting: 1: Two helpers  FIM - Radio producer Devices: Engineer, civil (consulting) Customer service manager) Toilet Transfers: Manufacturing engineer, 1-Two helpers  FIM - Control and instrumentation engineer Devices: Sliding board, Arm rests Bed/Chair Transfer: 1: Chair or W/C >  Bed: Total A (helper does all/Pt. < 25%)  FIM - Locomotion: Wheelchair Locomotion: Wheelchair: 0: Activity did not occur FIM - Locomotion: Ambulation Locomotion: Ambulation: 0: Activity did not occur  Comprehension Comprehension Mode: Auditory Comprehension: 2-Understands basic 25 - 49% of the time/requires cueing 51 - 75% of the time  Expression Expression Mode:  Verbal Expression Assistive Devices:  (head nods) Expression: 1-Expresses basis less than 25% of the time/requires cueing greater than 75% of the time.  Social Interaction Social Interaction: 2-Interacts appropriately 25 - 49% of time - Needs frequent redirection.  Problem Solving Problem Solving: 2-Solves basic 25 - 49% of the time - needs direction more than half the time to initiate, plan or complete simple activities  Memory Memory Mode: Not assessed (unable to assess because of aphasia) Memory: 1-Recognizes or recalls less than 25% of the time/requires cueing greater than 75% of the time  Medical Problem List and Plan: 1. Functional deficits secondary to Large L-MCA infarct with dense right hemiplegia, dysphagia and global aphasia.   2.  DVT Prophylaxis/Anticoagulation: Pharmaceutical  Coumadin for DVT 3. Pain Management: tramadol, tylenol, appropriate turning in bed, regular checks for comfort 4. Mood: LCSW to follow for evaluation and support.   5. Neuropsych: This patient is not capable of making decisions on her own behalf. 6. Skin/Wound Care:  Turn patient every 4 hours.  Rehab RN to monitor skin daily.  Maintain adequate nutrition and hydration status.   7. Fluids/Electrolytes/Nutrition:  Monitor I/O.Off IVF recheck BMET in am,per daughter eating better and taking thickened liquids -encourage po, full supervision with feeding 8. Hypertension: normotensive at present 9.  Thyroid mass question carcinoma, further eval as pt recovers from severe CVA 10.  Hx bilateral knee OA, saw ortho as outpt, diclofenac gel 11.  Thrombocytopenia stable- bruising but no obvious hematoma formation 12.  Prob UTI empiric abx until cx back, no allergies LOS (Days) 13 A FACE TO FACE EVALUATION WAS PERFORMED  Dimitrios Balestrieri E 03/26/2014, 7:41 AM

## 2014-03-26 NOTE — Progress Notes (Addendum)
Speech Language Pathology Daily Session Note  Patient Details  Name: Sandy Barrett MRN: [id number] Date of Birth: Nov 08, 1932  Today's Date: 03/26/2014 SLP Individual Time: 0830-0930 SLP Individual Time Calculation (min): 60 min  Short Term Goals: Week 2: SLP Short Term Goal 1 (Week 2): Pt will improve accuracy of yes/no responses to basic, biographical questions to >50% with max assist multimodal cuing.   SLP Short Term Goal 2 (Week 2): Pt will improve focused attention to therapist or during basic, familiar tasks for 30-45 seconds with max assist.  SLP Short Term Goal 3 (Week 2): Pt will complete verbal automatic sequences for 25-50% accuracy with max assist multimodal cues.   SLP Short Term Goal 4 (Week 2): Pt will scan to the right of midline during basic, familiar tasks over ~50% of observable opportunities with max multimodal cues.   SLP Short Term Goal 5 (Week 2): Pt will follow 1 step commands during basic, familiar tasks for 50% accuracy with max assist multimodal cuing.   SLP Short Term Goal 6 (Week 2): Pt will tolerate presentations of her currently prescribed diet with minimal overt s/s of aspiration and max assist for use of safe swallowing precautions.  Skilled Therapeutic Interventions: Skilled treatment session focused on addressing dysphagia and cognitive-linguistic goals. Upon arrival, patient was awake while sitting upright in the wheelchair and ready for assist with breakfast.  SLP facilitated session by providing Min physical and Mod visual cues for initiation and problem solving with self-feeding of Dys.1 textures and pudding-thick liquids.  SLP provided set-up of mirror for visual feedback to assist with monitoring of anterior bolus loss, which patient utilized with Mod multimodal cues.   Throughout session SLP utilized functional opportunities and Mod assist multimodal cues to express yes/no responses with multimodal communication.  Patient also completed automatic verbal  sequences with Max assist multimodal cues which resulted in more accurate approximations as compared to last session.  Patient able to approximate /r, m/ during repetitive versus of [place].  Continue with current plan of care.   FIM:  Comprehension Comprehension Mode: Auditory Comprehension: 2-Understands basic 25 - 49% of the time/requires cueing 51 - 75% of the time Expression Expression Mode: Verbal Expression: 1-Expresses basis less than 25% of the time/requires cueing greater than 75% of the time. Social Interaction Social Interaction: 2-Interacts appropriately 25 - 49% of time - Needs frequent redirection. Problem Solving Problem Solving: 2-Solves basic 25 - 49% of the time - needs direction more than half the time to initiate, plan or complete simple activities Memory Memory: 1-Recognizes or recalls less than 25% of the time/requires cueing greater than 75% of the time FIM - Eating Eating Activity: 4: Helper occasionally scoops food on utensil;4: Helper occasionally brings food to mouth;4: Helper checks for pocketed food;5: Set-up assist for open containers;4: Help with managing cup/glass;4: Help with picking up utensils;5: Needs verbal cues/supervision  Pain Pain Assessment Pain Assessment: No/denies pain  Therapy/Group: Individual Therapy  Carmelia Roller., CCC-SLP [phone]  [place] 03/26/2014, 3:59 PM

## 2014-03-26 NOTE — Progress Notes (Addendum)
Physical Therapy Session Note  Patient Details  Name: Sandy Barrett MRN: [id number] Date of Birth: April 12, 1932  Today's Date: 03/26/2014 PT Individual Time: 1430-1530 PT Individual Time Calculation (min): 60 min   Short Term Goals: Week 2:  PT Short Term Goal 1 (Week 2): Pt will perform supine > sit with max A and 75% cueing with HOB flat using rail. PT Short Term Goal 2 (Week 2): Pt will perform sit > supine with max A and 75% cueing with HOB flat. PT Short Term Goal 3 (Week 2): Pt will consistently transfer from bed <> w/c with max A and 75% cueing. PT Short Term Goal 4 (Week 2): Pt will perform dynamic standing balance x3 consecutive minutes with LE support, without UE support requiring SBA and 25% cueing.  Skilled Therapeutic Interventions/Progress Updates:    Pt received seated in tilt-in-space w/c. Secured R shoulder sling with total A to increase safety/stability of R glenohumeral joint with transitional movements, mobility. Noted pt with incontinent of bowel and bladder; however, pt shook head "no" when asked if aware of incontinent episode. Pt performed multiple sit <> stand transfers with bariatric Stedy in addition to max A to +2A (increasingly more assist required with increased pt fatigue). During hygiene, pt performed static standing with bariatric Stedy and mod to +2A (progressively more assist required, as described above) in addition to on addition max to +2A and multimodal cueing to maintain upright posture, to prevent LOB to R side secondary to pt pushing trunk laterally to R side. Initial standing was x5.5 consecutive minutes with max A; final standing trial was ~45 seconds with +2A. Retrieved manual w/c to enable pt to attempt self-propulsion of w/c; however, unable to attempt during this session secondary to time constraint. Will plan on attempting in later session. Departed with pt reclined in tilt-in-space w/c with quick release belt in place for safety, soft call bell within  reach, and pt in no apparent distress. Addendum: Noted pt initiation of utilizing LUE to manage RUE without cueing for the first time during this session.  Therapy Documentation Precautions:  Precautions Precautions: Fall Precaution Comments: [name] Restrictions Weight Bearing Restrictions: No Other Position/Activity Restrictions: R shoulder with sublux Pain:   Pain Assessment Pain Assessment: Faces Faces Pain Scale: No hurt  See FIM for current functional status  Therapy/Group: Individual Therapy  Stefano Gaul 03/26/2014, 8:27 PM

## 2014-03-27 ENCOUNTER — Inpatient Hospital Stay (HOSPITAL_COMMUNITY): Payer: Medicare Other | Admitting: Occupational Therapy

## 2014-03-27 ENCOUNTER — Inpatient Hospital Stay (HOSPITAL_COMMUNITY): Payer: Medicare Other

## 2014-03-27 ENCOUNTER — Inpatient Hospital Stay (HOSPITAL_COMMUNITY): Payer: Medicare Other | Admitting: Speech Pathology

## 2014-03-27 LAB — URINE CULTURE: Colony Count: >=100000

## 2014-03-27 LAB — CBC
HCT: 35.6 % — ABNORMAL LOW (ref 36.0–46.0)
Hemoglobin: 10.8 g/dL — ABNORMAL LOW (ref 12.0–15.0)
MCH: 29.5 pg (ref 26.0–34.0)
MCHC: 30.3 g/dL (ref 30.0–36.0)
MCV: 97.3 fL (ref 78.0–100.0)
Platelets: 457 10*3/uL — ABNORMAL HIGH (ref 150–400)
RBC: 3.66 MIL/uL — ABNORMAL LOW (ref 3.87–5.11)
RDW: 18.4 % — ABNORMAL HIGH (ref 11.5–15.5)
WBC: 5.8 10*3/uL (ref 4.0–10.5)

## 2014-03-27 LAB — PROTIME-INR
INR: 2.77 — ABNORMAL HIGH (ref 0.00–1.49)
Prothrombin Time: 29.5 seconds — ABNORMAL HIGH (ref 11.6–15.2)

## 2014-03-27 NOTE — Progress Notes (Signed)
BLE dopplers revealed DVT R-distal CFV, FV, popliteal vein and DVT L-FV and popliteal vein.  2D echo revealed EF 65-70% with interatrial septal aneurysm.   Protein C, Protein S and lupus anticoagulant negative. Surgery consult recommended for work up once patient stable and question of thyroid malignancy.    Subjective/Complaints: Restless at bedtime for the last 4d per daughter who is here today ROS: cannot obtain due to aphasia Objective: Vital Signs: Blood pressure 144/50, pulse 91, temperature 97.7 F (36.5 C), temperature source Oral, resp. rate 18, height 5\' 5"  (1.651 m), weight 79.8 kg (175 lb 14.8 oz), SpO2 97 %. No results found. Results for orders placed or performed during the hospital encounter of 03/13/14 (from the past 72 hour(s))  Protime-INR     Status: Abnormal   Collection Time: 03/25/14  7:20 AM  Result Value Ref Range   Prothrombin Time 28.9 (H) 11.6 - 15.2 seconds   INR 2.70 (H) 0.00 - 1.49  Urine culture     Status: None (Preliminary result)   Collection Time: 03/25/14  1:53 PM  Result Value Ref Range   Specimen Description URINE, CATHETERIZED    Special Requests NONE    Colony Count      >=100,000 COLONIES/ML Performed at [place] Performed at [facility]    Report Status PENDING   Urinalysis, Routine w reflex microscopic     Status: Abnormal   Collection Time: 03/25/14  1:53 PM  Result Value Ref Range   Color, Urine YELLOW YELLOW   APPearance CLOUDY (A) CLEAR   Specific Gravity, Urine 1.011 1.005 - 1.030   pH 7.5 5.0 - 8.0   Glucose, UA NEGATIVE NEGATIVE mg/dL   Hgb urine dipstick TRACE (A) NEGATIVE   Bilirubin Urine NEGATIVE NEGATIVE   Ketones, ur NEGATIVE NEGATIVE mg/dL   Protein, ur NEGATIVE NEGATIVE mg/dL   Urobilinogen, UA 0.2 0.0 - 1.0 mg/dL   Nitrite POSITIVE (A) NEGATIVE   Leukocytes, UA LARGE (A) NEGATIVE  Urine microscopic-add on     Status: Abnormal   Collection Time: 03/25/14   1:53 PM  Result Value Ref Range   WBC, UA 11-20 <3 WBC/hpf   Bacteria, UA MANY (A) RARE     HEENT: tongue midline,  neck vein prominence Cardio: RRR and no murmur Resp: CTA B/L and unlabored GI: BS positive and NT< ND Extremity:  Edema LUE> LLE Skin:   Bruise multiple ecchymotic areas BUE Neuro: alert, Cranial Nerve Abnormalities RIght central 7, Abnormal Sensory cannot assess due to aphasia, Abnormal Motor 0/5 RUE, 0/5  RLE, antigravity on Left but has receptive difficulties and apraxia limiting MMT, Dysarthric, MAS 3 in R pec and PT Aphasic and Apraxic Musc/Skel:  No pain with RUE ROM Gen NAD,    Assessment/Plan: 1. Functional deficits secondary to Large left MCA infarct embolic which require 3+ hours per day of interdisciplinary therapy in a comprehensive inpatient rehab setting. Physiatrist is providing close team supervision and 24 hour management of active medical problems listed below. Physiatrist and rehab team continue to assess barriers to discharge/monitor patient progress toward functional and medical goals. Restlessness at bedtime, discussed possible sundowning, assessment limited by severe aphasia, hold off on seroquel for now  Has UTI, on abx x 24h, monitor response FIM: FIM - Bathing Bathing Steps Patient Completed: Chest Bathing: 1: Two helpers  FIM - Upper Body Dressing/Undressing Upper body dressing/undressing steps patient completed: Thread/unthread left bra strap,  Thread/unthread left sleeve of pullover shirt/dress Upper body dressing/undressing: 1: Total-Patient completed less than 25% of tasks FIM - Lower Body Dressing/Undressing Lower body dressing/undressing: 1: Two helpers  FIM - Toileting Toileting: 1: Two helpers  FIM - Radio producer Devices: Engineer, civil (consulting) Customer service manager) Toilet Transfers: 1-Mechanical lift, 1-Two helpers  FIM - Control and instrumentation engineer Devices: Arm rests Bed/Chair Transfer: 1: Two  helpers, 1: Adult nurse (Stedy)  FIM - Locomotion: Wheelchair Locomotion: Wheelchair: 0: Activity did not occur FIM - Locomotion: Ambulation Locomotion: Ambulation: 0: Activity did not occur  Comprehension Comprehension Mode: Auditory Comprehension: 2-Understands basic 25 - 49% of the time/requires cueing 51 - 75% of the time  Expression Expression Mode: Verbal Expression Assistive Devices:  (head nods) Expression: 1-Expresses basis less than 25% of the time/requires cueing greater than 75% of the time.  Social Interaction Social Interaction: 2-Interacts appropriately 25 - 49% of time - Needs frequent redirection.  Problem Solving Problem Solving: 2-Solves basic 25 - 49% of the time - needs direction more than half the time to initiate, plan or complete simple activities  Memory Memory Mode: Not assessed (unable to assess because of aphasia) Memory: 1-Recognizes or recalls less than 25% of the time/requires cueing greater than 75% of the time  Medical Problem List and Plan: 1. Functional deficits secondary to Large L-MCA infarct with dense right hemiplegia, dysphagia and global aphasia.   2.  DVT Prophylaxis/Anticoagulation: Pharmaceutical  Coumadin for DVT 3. Pain Management: tramadol, tylenol, appropriate turning in bed, regular checks for comfort 4. Mood: LCSW to follow for evaluation and support.   5. Neuropsych: This patient is not capable of making decisions on her own behalf. 6. Skin/Wound Care:  Turn patient every 4 hours.  Rehab RN to monitor skin daily.  Maintain adequate nutrition and hydration status.   7. Fluids/Electrolytes/Nutrition:  Monitor I/O.Off IVF recheck BMET in am,per daughter eating better and taking thickened liquids -encourage po, full supervision with feeding 8. Hypertension: normotensive at present 9.  Thyroid mass question carcinoma, further eval as pt recovers from severe CVA 10.  Hx bilateral knee OA, saw ortho as outpt, diclofenac gel 11.   Thrombocytopenia stable- bruising but no obvious hematoma formation 12.  Prob UTI empiric abx until cx back, no allergies LOS (Days) 14 A FACE TO FACE EVALUATION WAS PERFORMED  KIRSTEINS,ANDREW E 03/27/2014, 8:01 AM

## 2014-03-27 NOTE — Progress Notes (Signed)
ANTICOAGULATION CONSULT NOTE - Follow Up Consult  Pharmacy Consult for coumadin Indication: DVT/PE  No Known Allergies  Patient Measurements: Height: 5\' 5"  (165.1 cm) Weight: 175 lb 14.8 oz (79.8 kg) IBW/kg (Calculated) : 57 Heparin Dosing Weight:   Vital Signs: Temp: 97.7 F (36.5 C) (03/22 0648) Temp Source: Oral (03/22 0648) BP: 144/50 mmHg (03/22 0648) Pulse Rate: 91 (03/22 0648)  Labs:  Recent Labs  03/25/14 0720 03/27/14 0735  HGB  --  10.8*  HCT  --  35.6*  PLT  --  457*  LABPROT 28.9* 29.5*  INR 2.70* 2.77*    Estimated Creatinine Clearance: 59.5 mL/min (by C-G formula based on Cr of 0.4).   Medications:  Scheduled:  . antiseptic oral rinse  7 mL Mouth Rinse BID  . cephALEXin  250 mg Oral 3 times per day  . chlorhexidine  15 mL Mouth Rinse BID  . diclofenac sodium  2 g Topical QID  . feeding supplement (ENSURE)  1 Container Oral TID BM  . iron polysaccharides  150 mg Oral BID AC  . pantoprazole sodium  40 mg Per Tube BID  . warfarin  4 mg Oral q1800  . Warfarin - Pharmacist Dosing Inpatient   Does not apply q1800   Infusions:    Assessment: 81 yo female with DVT/PE is currently on therapeutic coumadin.  INR today is 2.77. Goal of Therapy:  INR 2-3 Monitor platelets by anticoagulation protocol: Yes   Plan:  - Coumadin 4 mg daily -INR TTS  Kessler Solly, Tsz-Yin 03/27/2014,8:39 AM

## 2014-03-27 NOTE — Progress Notes (Signed)
Speech Language Pathology Daily Session Note  Patient Details  Name: Sandy Barrett MRN: [id number] Date of Birth: [birth year]  Today's Date: 03/27/2014 SLP Individual Time: 0830-0930 SLP Individual Time Calculation (min): 60 min  Short Term Goals: Week 2: SLP Short Term Goal 1 (Week 2): Pt will improve accuracy of yes/no responses to basic, biographical questions to >50% with max assist multimodal cuing.   SLP Short Term Goal 2 (Week 2): Pt will improve focused attention to therapist or during basic, familiar tasks for 30-45 seconds with max assist.  SLP Short Term Goal 3 (Week 2): Pt will complete verbal automatic sequences for 25-50% accuracy with max assist multimodal cues.   SLP Short Term Goal 4 (Week 2): Pt will scan to the right of midline during basic, familiar tasks over ~50% of observable opportunities with max multimodal cues.   SLP Short Term Goal 5 (Week 2): Pt will follow 1 step commands during basic, familiar tasks for 50% accuracy with max assist multimodal cuing.   SLP Short Term Goal 6 (Week 2): Pt will tolerate presentations of her currently prescribed diet with minimal overt s/s of aspiration and max assist for use of safe swallowing precautions.  Skilled Therapeutic Interventions:  Pt was seen for skilled ST targeting goals for dysphagia and communication.  Pt was received from OT session, seated upright in wheelchair, awake and alert.  SLP facilitated the session with a functional sorting task targeting initiation and sustained attention which pt completed with max demonstration cues faded to mod assist visual with repeated practice.  SLP increased task complexity with a symbol cancellation task targeting the same goals which pt completed with max faded to mod assist visual, verbal, and tactile cues.  Max to total assist was needed when SLP attempted to further increase task complexity with discrimination between 2 symbols which SLP suspects was related to receptive language  deficits and perseveration.  For verbal output during structured tasks, pt was >75% accurate for voiced bilabial CV syllable repetition when completed in unison with SLP with mod demonstration cues.  Additionally, pt was noted to approximate between 25-50% of initial phonemes during structured practice with previously targeted familiar phrases and songs with mod-max assist visual and verbal cues.  Furthermore, pt consumed trials of honey thick liquids via teaspoon with mod-max assist multimodal cues to initiate self feeding and control rate and portion size.  No overt s/s of aspiration were observed with the abovementioned trials.  Pt may benefit from a trial meal tray with honey thick liquids to assess readiness for liquids progression.  Continue per current plan of care.    FIM:  Comprehension Comprehension Mode: Auditory Comprehension: 2-Understands basic 25 - 49% of the time/requires cueing 51 - 75% of the time Expression Expression Mode: Verbal;Nonverbal Expression: 1-Expresses basis less than 25% of the time/requires cueing greater than 75% of the time. Social Interaction Social Interaction: 2-Interacts appropriately 25 - 49% of time - Needs frequent redirection. Problem Solving Problem Solving: 2-Solves basic 25 - 49% of the time - needs direction more than half the time to initiate, plan or complete simple activities Memory Memory: 1-Recognizes or recalls less than 25% of the time/requires cueing greater than 75% of the time FIM - Eating Eating Activity: 4: Help with managing cup/glass;5: Needs verbal cues/supervision;4: Help with picking up utensils;4: Helper occasionally scoops food on utensil  Pain Pain Assessment Pain Assessment: No/denies pain  Therapy/Group: Individual Therapy  Sandy Barrett, Selinda Orion 03/27/2014, 12:31 PM

## 2014-03-27 NOTE — Progress Notes (Signed)
Occupational Therapy Session Note  Patient Details  Name: Sandy Barrett MRN: [id number] Date of Birth: 08-10-1932  Today's Date: 03/27/2014 OT Individual Time: 1443-1540 OT Individual Time Calculation (min): 60 min    Short Term Goals: Week 2:  OT Short Term Goal 1 (Week 2): Pt will complete bathing with mod assist and mod cues for initiation OT Short Term Goal 2 (Week 2): Pt will complete LB dressing with max assist of one caregiver with mod cues for initiation OT Short Term Goal 3 (Week 2): Pt will complete UB dressing with mod assist and mod cues for initiation OT Short Term Goal 4 (Week 2): Pt will complete toilet transfer with max assist of one caregiver   Skilled Therapeutic Interventions/Progress Updates:    Engaged in ADL retraining with focus on Rt attention, scanning to RUE, initiation and sequencing with self-care tasks.  Pt received seated upright in tilt in space w/c.  Engaged in self-care retraining in sitting at sink with mirror for visual feedback. Pt scanned to Rt arm to doff hospital gown with max multimodal cues. Pt with increased initiation with setup this session with ability to wash chest and Rt underarm with wash cloth placed on chest and therapist assisting with raising Rt arm.  Therapist positioned to pt's Rt to promote scanning to Rt environment and Rt side of body.  With increased time and setup, pt washed Lt thigh and Rt forearm.  Pt with ideational apraxia when provided with lipstick (familiar task), pt attempted to eat it, unable to complete even with hand over hand as pt continued to attempt to open mouth to bite.  Pt unable to raise intact LUE over head when attempting to don dress despite increased cues and time.  Setup for breakfast with pt requiring occasional scooping of food to ensure safe portion control.  Use of mirror to promote attention to Rt side of face and spillage of food with pt requiring verbal and tactile cue to note with improved ability to wipe Rt  side of face this session.  Therapy Documentation Precautions:  Precautions Precautions: Fall Precaution Comments: [name] Restrictions Weight Bearing Restrictions: No Other Position/Activity Restrictions: R shoulder with sublux General:   Vital Signs: Therapy Vitals Temp: 97.7 F (36.5 C) Temp Source: Oral Pulse Rate: 91 Resp: 18 BP: 133/78 mmHg Patient Position (if appropriate): Sitting Oxygen Therapy SpO2: 97 % O2 Device: Not Delivered Pain:  Pt with no c/o pain  See FIM for current functional status  Therapy/Group: Individual Therapy  Simonne Come 03/27/2014, 10:46 AM

## 2014-03-27 NOTE — Progress Notes (Signed)
Physical Therapy Session Note  Patient Details  Name: Sandy Barrett MRN: [id number] Date of Birth: [birth year]  Today's Date: 03/27/2014 PT Individual Time: 1135-1205 PT Individual Time Calculation (min): 30 min  and Today's Date: 03/27/2014 PT Co-Treatment Time: 2633-3545 (cotreat with recreation therapy) PT Co-Treatment Time Calculation (min): 30 min  Short Term Goals: Week 2:  PT Short Term Goal 1 (Week 2): Pt will perform supine > sit with max A and 75% cueing with HOB flat using rail. PT Short Term Goal 2 (Week 2): Pt will perform sit > supine with max A and 75% cueing with HOB flat. PT Short Term Goal 3 (Week 2): Pt will consistently transfer from bed <> w/c with max A and 75% cueing. PT Short Term Goal 4 (Week 2): Pt will perform dynamic standing balance x3 consecutive minutes with LE support, without UE support requiring SBA and 25% cueing.  Skilled Therapeutic Interventions/Progress Updates:  Patient sitting in tilt in space wheelchair upon entering room. Cotreat with TR for first half of session focusing on sit to stand with stedy and sitting balance/trunk control resting on seat of stedy. Patient with short bouts of standing averaging about 30 seconds. Patient stood with flexed posture requiring manual facilitation for hip and trunk extension and tends to push with left extremities towards right. Worked on following commands and voicing with counting and singing during activities. Used stedy +2 to transition patient to regular wheelchair to work on wheelchair propulsion. Patient required hand over hand repetition to use left UE to propel forward. Patient pushed about 40 feet with mod assist to stay straight. Patient unable to coordinate with foot. Patient propelled wheelchair backwards using left LE quads with min assist for direction. Used stedy +2 to return patient to tilt in space wheelchair. Patient left in tilt in space with daughters present.   Therapy Documentation Precautions:   Precautions Precautions: Fall Precaution Comments: [name] Restrictions Weight Bearing Restrictions: No Other Position/Activity Restrictions: R shoulder with sublux General:   Vital Signs:   Pain: Pain Assessment Pain Assessment: No/denies pain Mobility:   Locomotion :    Trunk/Postural Assessment :    Balance:   Exercises:   Other Treatments:    See FIM for current functional status  Therapy/Group: Individual Therapy and Co-Treatment  Elder Love M 03/27/2014, 12:12 PM

## 2014-03-27 NOTE — Progress Notes (Signed)
Recreational Therapy Session Note  Patient Details  Name: Sandy Barrett MRN: [id number] Date of Birth: July 02, 1932 Today's Date: 03/27/2014  Pain: no c/o Skilled Therapeutic Interventions/Progress Updates: Session focused on activity tolerance, sit to stands with Stedy, standing tolerance, voicing with counting & singing.  Pt performed sit-stands with +2 assist, pt tends to stand with flexed posture and pushes to the right.  Pt standing ~30 seconds each time.  While seated in high perch position on [place], pt attempted to voice during singing activity.  Pts daughters present and observing.  Therapy/Group: Co-Treatment Anastasia Tompson 03/27/2014, 4:32 PM

## 2014-03-28 ENCOUNTER — Inpatient Hospital Stay (HOSPITAL_COMMUNITY): Payer: Medicare Other | Admitting: Physical Therapy

## 2014-03-28 ENCOUNTER — Ambulatory Visit (HOSPITAL_COMMUNITY): Payer: Medicare Other | Admitting: Speech Pathology

## 2014-03-28 ENCOUNTER — Inpatient Hospital Stay (HOSPITAL_COMMUNITY): Payer: Medicare Other | Admitting: Occupational Therapy

## 2014-03-28 LAB — BASIC METABOLIC PANEL
Anion gap: 8 (ref 5–15)
BUN: 8 mg/dL (ref 6–23)
CO2: 26 mmol/L (ref 19–32)
Calcium: 9.5 mg/dL (ref 8.4–10.5)
Chloride: 108 mmol/L (ref 96–112)
Creatinine, Ser: 0.45 mg/dL — ABNORMAL LOW (ref 0.50–1.10)
GFR calc Af Amer: >90 mL/min (ref >90)
GFR calc non Af Amer: >90 mL/min (ref >90)
Glucose, Bld: 80 mg/dL (ref 70–99)
Potassium: 4 mmol/L (ref 3.5–5.1)
Sodium: 142 mmol/L (ref 135–145)

## 2014-03-28 NOTE — Progress Notes (Addendum)
Physical Therapy Session Note  Patient Details  Name: Sandy Barrett MRN: [id number] Date of Birth: 1932/06/04  Today's Date: 03/28/2014 PT Individual Time: 1300-1400 PT Individual Time Calculation (min): 60 min   Short Term Goals: Week 2:  PT Short Term Goal 1 (Week 2): Pt will perform supine > sit with max A and 75% cueing with HOB flat using rail. PT Short Term Goal 2 (Week 2): Pt will perform sit > supine with max A and 75% cueing with HOB flat. PT Short Term Goal 3 (Week 2): Pt will consistently transfer from bed <> w/c with max A and 75% cueing. PT Short Term Goal 4 (Week 2): Pt will perform dynamic standing balance x3 consecutive minutes with LE support, without UE support requiring SBA and 25% cueing.  Skilled Therapeutic Interventions/Progress Updates:    2:1. Pt received seated in tilt-in-space w/c accompanied by daughter. Session focused on functional transfers, attempted w/c mobility. Retrieved standard height manual w/c (18"x18") and basic cushion for pt to utilize during therapy only to enable pt to self-propel w/c. Transported pt to gym in w/c with total A. Pt performed squat pivot transfer from w/c > mat table (to L side) with +2Total A, assist of third person required for scooting hips posteriorly due to onset of RLE extensor tone, pt pushing trunk posteriorly during final 10% of transfer. With bilat LE's supported, pt required supervision to min A to maintain static sitting balance.While donning RUE sling with bilat LE's supported, pt required min to mod A for dynamic sitting balance.   Pt adamantly refusing to utilize bariatric Stedy for transfer from mat table > manual w/c; therefore, pt performed squat pivot transfer from mat table > w/c (to L side) with hand-over-back technique requiring Total A (+2A for safety), manual facilitation of anterior weight shift, manual stabilization of RLE secondary to extensor tone in R knee. Once in manual w/c, pt performed retro w/c propulsion  (due to difficulty reaching floor) x15' with LLE only requiring total A, max encouragement, and tactile cueing at L knee for weightbearing, proprioception. Will plan on trial of w/c mobility using hemi height chair vs. L one arm drive in future sessions. Transported pt remaining distance to room with total A for time management. Pt performed squat pivot transfer (to R side) from w/c > recliner (per request of daughter) with assist, manual facilitation/stabilization as described above. Departed with pt seated in recliner with bilat LE's elevated, quick release belt in place for safety, daughter present, and soft call bell within reach.  Therapy Documentation Precautions:  Precautions Precautions: Fall Precaution Comments: [name] Restrictions Weight Bearing Restrictions: No Other Position/Activity Restrictions: R shoulder with sublux Pain:  Pain Scale: FACES 0 = No Pain.  See FIM for current functional status  Therapy/Group: Individual Therapy  [name], [name] 03/28/2014, 10:14 PM

## 2014-03-28 NOTE — Progress Notes (Signed)
Occupational Therapy Weekly Progress Note  Patient Details  Name: Sandy Barrett MRN: [id number] Date of Birth: 10/20/1932  Beginning of progress report period: March 22, 2014 End of progress report period: March 28, 2014  Today's Date: 03/28/2014 OT Individual Time: 6203-5597 and 0930-1000 OT Individual Time Calculation (min): 60 min and 30 min   Patient has met 0 of 4 short term goals.  Pt is slow to make progress towards goals due greatly to apraxia, aphasia, and impaired cognition.  Pt requires increased multimodal cues for initiation and sequencing with basic, familiar self-care tasks.  Pt is demonstrating increased scanning to impaired RUE and increased initiation during bathing and dressing tasks, however pt continues to require +2 for all transfers and sit <> stand.  Patient continues to demonstrate the following deficits: apraxia, aphasia, cognitive deficits, impaired initiation, sequencing, motor planning, ideational apraxia, sitting balance, standing balance, sit <> stand, and developing RUE tone and therefore will continue to benefit from skilled OT intervention to enhance overall performance with BADL and Reduce care partner burden.  Patient not progressing toward long term goals.  See goal revision..  Plan of care revisions: downgraded to max assist overall.  OT Short Term Goals Week 2:  OT Short Term Goal 1 (Week 2): Pt will complete bathing with mod assist and mod cues for initiation OT Short Term Goal 1 - Progress (Week 2): Not progressing OT Short Term Goal 2 (Week 2): Pt will complete LB dressing with max assist of one caregiver with mod cues for initiation OT Short Term Goal 2 - Progress (Week 2): Not progressing OT Short Term Goal 3 (Week 2): Pt will complete UB dressing with mod assist and mod cues for initiation OT Short Term Goal 3 - Progress (Week 2): Not progressing OT Short Term Goal 4 (Week 2): Pt will complete toilet transfer with max assist of one caregiver   OT Short Term Goal 4 - Progress (Week 2): Not progressing Week 3:  OT Short Term Goal 1 (Week 3): Pt will initiate sit > stand with use of lift [facility]) for toilet transfer with mod verbal cues and mod assist OT Short Term Goal 2 (Week 3): Pt will initiate UB bathing with mod tactile and setup cues OT Short Term Goal 3 (Week 3): Pt will roll in bed with mod multimodal cues and max assist to assist caregiver with LB bathing  Skilled Therapeutic Interventions/Progress Updates:    1) Engaged in ADL retraining with focus on bed mobility, functional transfers, sit <> stand, and attention to RUE during self-care tasks.  Pt in bed upon arrival, completed rolling at bed level for perineal hygiene with mod assist when rolling to Lt and Total assist when rolling to Rt due to RUE weakness and tone in RUE and RLE.  Pt shaking head "yes" and "no" throughout session with no apparent accuracy as pt answering both when asked about toileting in various ways.  Completed stand pivot transfer bed > drop arm BSC with +2, where pt continent of bowel and bladder.  Pt required blocking of BLE during transfer and sit > stand secondary to tone in RLE and knee issues on Lt.  Engaged in partial standing with therapist providing blocking at BLE and tactile cues for upright posture while 2nd person completed hygiene and pulled up brief and pants.  In standing, 2nd person swapped out [facility] for tilt in space w/c.  Engaged in [place] bathing at sink with pt demonstrating increased scanning to RUE and washing chest  and Rt underarm while therapist supported RUE.  Pt continues to demonstrate perseveration with self-care tasks and difficulty terminating tasks.  Pt thread Rt bra strap this session with increased scanning and attention to Rt body.  Pt with difficulty donning shirt and unable to recognize errors to correct.    2) Engaged in therapeutic activity with focus on Rt attention, initiation, and participation in familiar tasks.  Pt received up  in tilt-in space w/c.  Discussed timed toileting and use of Stedy with RN to increase continence.  Provided pt with mirror to promote visual input while applying lipstick.  Hand over hand assist to hold lipstick in Rt hand while pt opened cap with demonstration cues.  Pt initially unable to bring lipstick to mouth, but with hand over hand pt attempting to bite lipstick with difficulty redirecting to place on lips.  Pt unable to blot lips this session, but wiping face when handed paper towel.  Attempted to engage in familiar task of flipping through newspaper to increase initiation and sustained attention with pt requiring hand over hand initially and demonstration cues to initiation turning pages.  Pt with increased vocalizations during this session, still incomprehensible but able to participate in singing activity.  Therapy Documentation Precautions:  Precautions Precautions: Fall Precaution Comments: [name] Restrictions Weight Bearing Restrictions: No Other Position/Activity Restrictions: R shoulder with sublux General:   Vital Signs: Therapy Vitals Temp: 98.3 F (36.8 C) Temp Source: Oral Pulse Rate: 84 Resp: 18 BP: (!) 122/48 mmHg Patient Position (if appropriate): Lying Oxygen Therapy SpO2: 93 % O2 Device: Not Delivered Pain:   ADL:   Exercises:   Other Treatments:    See FIM for current functional status  Therapy/Group: Individual Therapy  Simonne Come 03/28/2014, 10:13 AM

## 2014-03-28 NOTE — Plan of Care (Signed)
Problem: RH Grooming Goal: LTG Patient will perform grooming w/assist,cues/equip (OT) LTG: Patient will perform grooming with assist, with/without cues using equipment (OT)  Downgraded due to slow progress  Problem: RH Bathing Goal: LTG Patient will bathe with assist, cues/equipment (OT) LTG: Patient will bathe specified number of body parts with assist with/without cues using equipment (position) (OT)  Downgraded due to slow progress  Problem: RH Dressing Goal: LTG Patient will perform upper body dressing (OT) LTG Patient will perform upper body dressing with assist, with/without cues (OT).  Downgraded due to slow progress Goal: LTG Patient will perform lower body dressing w/assist (OT) LTG: Patient will perform lower body dressing with assist, with/without cues in positioning using equipment (OT)  Downgraded due to slow progress  Problem: RH Toileting Goal: LTG Patient will perform toileting w/assist, cues/equip (OT) LTG: Patient will perform toiletiing (clothes management/hygiene) with assist, with/without cues using equipment (OT)  Downgraded due to slow progress  Problem: RH Toilet Transfers Goal: LTG Patient will perform toilet transfers w/assist (OT) LTG: Patient will perform toilet transfers with assist, with/without cues using equipment (OT)  Downgraded due to slow progress  Problem: RH Tub/Shower Transfers Goal: LTG Patient will perform tub/shower transfers w/assist (OT) LTG: Patient will perform tub/shower transfers with assist, with/without cues using equipment (OT)  Downgraded due to slow progress.

## 2014-03-28 NOTE — Progress Notes (Signed)
Speech Language Pathology Weekly Progress and Session Note  Patient Details  Name: Sandy Barrett MRN: [id number] Date of Birth: [birth year]  Beginning of progress report period: March 21, 2014 End of progress report period: March 28, 2014  Today's Date: 03/28/2014 SLP Individual Time: 1130-1200 SLP Individual Time Calculation (min): 30 min SLP Individual Time: 1500-1530 SLP Individual Time Calculation (min): 30 min  Short Term Goals: Week 2: SLP Short Term Goal 1 (Week 2): Pt will improve accuracy of yes/no responses to basic, biographical questions to >50% with max assist multimodal cuing.   SLP Short Term Goal 1 - Progress (Week 2): Met SLP Short Term Goal 2 (Week 2): Pt will improve focused attention to therapist or during basic, familiar tasks for 30-45 seconds with max assist.  SLP Short Term Goal 2 - Progress (Week 2): Met SLP Short Term Goal 3 (Week 2): Pt will complete verbal automatic sequences for 25-50% accuracy with max assist multimodal cues.   SLP Short Term Goal 3 - Progress (Week 2): Progressing toward goal SLP Short Term Goal 4 (Week 2): Pt will scan to the right of midline during basic, familiar tasks over ~50% of observable opportunities with max multimodal cues.   SLP Short Term Goal 4 - Progress (Week 2): Met SLP Short Term Goal 5 (Week 2): Pt will follow 1 step commands during basic, familiar tasks for 50% accuracy with max assist multimodal cuing.   SLP Short Term Goal 5 - Progress (Week 2): Met SLP Short Term Goal 6 (Week 2): Pt will tolerate presentations of her currently prescribed diet with minimal overt s/s of aspiration and max assist for use of safe swallowing precautions. SLP Short Term Goal 6 - Progress (Week 2): Met    New Short Term Goals: Week 3: SLP Short Term Goal 1 (Week 3): Pt will improve accuracy of  yes/no responses to basic, biographical questions to >50% with mod-max assist gestural cues   SLP Short Term Goal 2 (Week 3): Pt will sustain  attention to therapist or a basic, familiar task for 1-2 seconds with max assist verbal and tactile cues for redirection.   SLP Short Term Goal 3 (Week 3): Pt will complete verbal automatic sequences for 25-50% accuracy of approximations with max assist demonstration cues.   SLP Short Term Goal 4 (Week 3): Pt will attend to the right of the environment during basic, familiar tasks with mod assist verbal and tactile cues.   SLP Short Term Goal 5 (Week 3): Pt will follow 1 step commands during basic, familiar tasks for 50% accuracy with max assist demonstration cues.   SLP Short Term Goal 6 (Week 3): Pt will consume dys 1 textures and honey thick liquids with mod assist verbal and visual cues to monitor and correct anterior loss of materials and utilize rate and portion control over three targeted sessions prior to diet advancement.     Weekly Progress Updates:  Pt made slow, functional gains this reporting period and has met 5 out of 6 short term goals.  Pt has demonstrated slight improvements of initiation and sustained attention during functional and personally meaningful familiar tasks; however, carryover of initiation has not consistently occurred for structured therapeutic tasks.  Pt continues to present with a severe global aphasia with perseveration of jargon and difficulty following commands and answering basic yes/no questions.  Pt requires max to total assist for functional communication to make needs/wants known.  Pt's diet has been upgraded to dys 1 textures with honey thick liquids  due to decreased frequency of wet vocal quality and clinical s/s of aspiration during trials.  Pt would continue to benefit from skilled ST while inpatient in order to maximize functional independence and reduce burden of care prior to discharge.  Continue to recommend 24/7 supervision and ST follow up at next level of care to continue addressing cognition, dysphagia, and aphasia.  Pt and family education is ongoing.      Intensity: Minumum of 1-2 x/day, 30 to 90 minutes Frequency: 3 to 5 out of 7 days Duration/Length of Stay: 21-28 days  Treatment/Interventions: Cognitive remediation/compensation;Cueing hierarchy;Dysphagia/aspiration precaution training;Functional tasks;Patient/family education;Speech/Language facilitation;Multimodal communication approach;Internal/external aids;Environmental controls   Daily Session  Skilled Therapeutic Interventions:  Session 1: Pt was seen for skilled ST targeting dysphagia goals.  Pt was received in wheelchair, awake, alert, and agreeable to participate in therapy as indicated via head nod and smile.  SLP facilitated the session with a trial tray of dys 1 textures with honey thick liquids to assess readiness for liquids progression.  Pt required max assist verbal, visual, and tactile cues for rate and portion control, max to total assist to correct right anterior loss of materials.  Pt demonstrated minimal pocketing with prescribed textures and no overt s/s of aspiration immediately following meal or following a ~5 minute delay.  Recommend a diet upgrade to honey thick liquids     Session 2:  Pt was seen for skilled ST targeting goals for functional communication.  Upon arrival, pt was seated in recliner with daughters and sisters in law present.  Pt was awake, alert, and pleasantly interactive.   SLP facilitated the session with structured automatic verbal sequences and familiar  to facilitate spontaneous vocalizations.  Pt generated approximations for 50% of initial bilabial consonants and rounded syllables with max assist demonstration cues.  Continue per current plan of care.    FIM:  Comprehension Comprehension Mode: Auditory Comprehension: 2-Understands basic 25 - 49% of the time/requires cueing 51 - 75% of the time Expression Expression Mode: Nonverbal Expression: 1-Expresses basis less than 25% of the time/requires cueing greater than 75% of the time. Social  Interaction Social Interaction: 2-Interacts appropriately 25 - 49% of time - Needs frequent redirection. Problem Solving Problem Solving: 2-Solves basic 25 - 49% of the time - needs direction more than half the time to initiate, plan or complete simple activities Memory Memory: 1-Recognizes or recalls less than 25% of the time/requires cueing greater than 75% of the time FIM - Eating Eating Activity: 4: Helper checks for pocketed food;5: Needs verbal cues/supervision;4: Help with managing cup/glass;4: Help with picking up utensils  Pain Pain Assessment (session 1)  Pain Assessment: Faces Pain Score: 0-No pain Pain Assessment (session 2) Pain Assessment: Faces Pain Score: 0-No pain  Therapy/Group: Individual Therapy  Zynasia Burklow, Selinda Orion 03/28/2014, 4:59 PM

## 2014-03-28 NOTE — Progress Notes (Signed)
Social Work Patient ID: [name], female   DOB: July 28, 1932, 81 y.o.   MRN: [id number] Met with daughter to inform of team conference progress toward her goals and plan.  Both daughter's plan to talk with Mom tonight and discuss the plan of NHP after rehab. They have toured facilities and would like [facility] to be their first choice.  Will wait for daughter's to speak with pt and move forward with NH option.  MD feels she will be  Medically ready early next week.

## 2014-03-28 NOTE — Patient Care Conference (Signed)
Inpatient RehabilitationTeam Conference and Plan of Care Update Date: 03/28/2014   Time: 10;35 AM    Patient Name: Sandy Barrett      Medical Record Number: [id number]  Date of Birth: June 14, 1932 Sex: Female         Room/Bed: 4W20C/4W20C-01 Payor Info: Payor: MEDICARE / Plan: MEDICARE PART A AND B / Product Type: *No Product type* /    Admitting Diagnosis: L MCA CVA  Admit Date/Time:  03/13/2014  4:08 PM Admission Comments: No comment available   Primary Diagnosis:  Cerebral infarction due to embolism of left middle cerebral artery Principal Problem: Cerebral infarction due to embolism of left middle cerebral artery  Patient Active Problem List   Diagnosis Date Noted  . Acute pulmonary embolism 03/14/2014  . DVT of lower extremity, bilateral 03/14/2014  . Global aphasia 03/14/2014  . 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 Dysphagia due to recent stroke 03/14/2014  . Aphasia due to stroke 03/13/2014  . Right hemiparesis 03/13/2014  . Embolic stroke   . Cerebral infarction due to embolism of left middle cerebral artery   . Cerebral infarction due to unspecified mechanism   . Respiratory failure   . Cerebral infarction due to thrombosis of right middle cerebral artery   . Stroke 03/08/2014  . Pulmonary embolism 03/08/2014  . CVA (cerebral infarction)   . Right sided weakness   . Back pain 08/29/2013  . Primary localized osteoarthrosis, lower leg 03/06/2013  . IBS (irritable bowel syndrome)   . 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 Multinodular goiter 01/13/2011  . Abnormal chest x-ray 12/12/2010  . HYPERTHYROIDISM 11/11/2009  . INSOMNIA 07/03/2009  . POSTTRAUMATIC STRESS DISORDER 08/22/2008  . OBESITY 04/21/2008  . OSTEOARTHRITIS 04/21/2008  . MIGRAINE HEADACHE 04/20/2008  . Essential hypertension 04/20/2008    Expected Discharge Date: Expected Discharge Date: 04/03/14  Team Members Present: Physician leading conference: Dr. Alysia Penna Social Worker Present: Ovidio Kin, LCSW Nurse Present: Heather Roberts, RN PT Present: Georjean Mode, PT;Blair [name], PT OT Present: Simonne Come, Dorothyann Gibbs, OT SLP Present: Windell Moulding, SLP PPS Coordinator present : Daiva Nakayama, RN, CRRN     Current Status/Progress Goal Weekly Team Focus  Medical   severe aphasia, apraxia, R HP, DVT on po anticoag, intake increasing  adequate caloric intake   monitor hydration   Bowel/Bladder   Patient is generally incontinent of bowel and bladder, occsionally continent  to be continent of bowel and bladder with mod assist  timed toileting q2h and after meals   Swallow/Nutrition/ Hydration   Dys 1 pudding thick liquids via teasoon, full supervision, hand over hand assist, minimal overt s/s of aspiration   min assist with least restrictive diet   trials of honey thick liquids for liquids progression    ADL's   max assist with bed mobility, +2 with clothing management at bed level and at sit > stand level, +2 for transfers or use of Stedy.  Pt's cognitive deficits severly impact her ability to participate in functional tasks, noted improvements in very basic functional tasks.  mod assist overall, most likely will have to downgrade to max transfers and self-care tasks  initiation, Rt attention, transfers, sit <> stand   Mobility   Max A bed mobility, +2A transfers, supervision to Mod A sitting balance  Min A w/c mobility; Mod A bed mobility and transfers  Attention to R side of body, functional transfers, bed mobility, dynamic sitting balance   Communication   total assist for functional communication, receptive >  expressive   max assist   multimodal communication, initiation and cessation of verbal and nonverbal communication attempts, automatic sequences    Safety/Cognition/ Behavioral Observations  mod-max assist sustained attention, initiation, perseveration, poor awareness of verbal errors   max assist for basic tasks   sustained attention, initiation and cessation during  basic, familiar tasks    Pain   patient denies pain  pain less than or equal to 4 on a scale of 0-10  assess pain q4h and medicate as indicated   Skin   MASD to bilateral buttocks, boggy heels  prevent fuirther skin injury/breakdown  keep skin clean and dry, bilateral prafo boots when in bed      *See Care Plan and progress notes for long and short-term goals.  Barriers to Discharge: heavy assist, multiple med issues    Possible Resolutions to Barriers:       Discharge Planning/Teaching Needs:  Daughter's continue to be here and attend therapies with pt.  ALso touring NH's and aware pt will be ready early next week for transfer.      Team Discussion:  Pt making very slow progress, tone better in r-knee. Continues to be plus 2 total assist.  May be able to upgrade diet to honey thick from pudding. Y/N more consistent. Treating UTI. Maintaining her hydration and caloric intake.  Revisions to Treatment Plan:  NHP, downgrade goals to max assist   Continued Need for Acute Rehabilitation Level of Care: The patient requires daily medical management by a physician with specialized training in physical medicine and rehabilitation for the following conditions: Daily direction of a multidisciplinary physical rehabilitation program to ensure safe treatment while eliciting the highest outcome that is of practical value to the patient.: Yes Daily medical management of patient stability for increased activity during participation in an intensive rehabilitation regime.: Yes Daily analysis of laboratory values and/or radiology reports with any subsequent need for medication adjustment of medical intervention for : Neurological problems;Other  Elease Hashimoto 03/29/2014, 8:56 AM

## 2014-03-28 NOTE — Progress Notes (Signed)
BLE dopplers revealed DVT R-distal CFV, FV, popliteal vein and DVT L-FV and popliteal vein.  2D echo revealed EF 65-70% with interatrial septal aneurysm.   Protein C, Protein S and lupus anticoagulant negative. Surgery consult recommended for work up once patient stable and question of thyroid malignancy.    Subjective/Complaints: No issues overnite ROS: cannot obtain due to aphasia Objective: Vital Signs: Blood pressure 122/48, pulse 84, temperature 98.3 F (36.8 C), temperature source Oral, resp. rate 18, height 5\' 5"  (1.651 m), weight 79.8 kg (175 lb 14.8 oz), SpO2 93 %. No results found. Results for orders placed or performed during the hospital encounter of 03/13/14 (from the past 72 hour(s))  Urine culture     Status: None   Collection Time: 03/25/14  1:53 PM  Result Value Ref Range   Specimen Description URINE, CATHETERIZED    Special Requests NONE    Colony Count      >=100,000 COLONIES/ML Performed at [place] Performed at [facility]    Report Status 03/27/2014 FINAL    Organism ID, Bacteria ESCHERICHIA COLI       Susceptibility   Escherichia coli - MIC*    AMPICILLIN 4 SENSITIVE Sensitive     CEFAZOLIN <=4 SENSITIVE Sensitive     CEFTRIAXONE <=1 SENSITIVE Sensitive     CIPROFLOXACIN <=0.25 SENSITIVE Sensitive     GENTAMICIN <=1 SENSITIVE Sensitive     LEVOFLOXACIN <=0.12 SENSITIVE Sensitive     NITROFURANTOIN 32 SENSITIVE Sensitive     TOBRAMYCIN <=1 SENSITIVE Sensitive     TRIMETH/SULFA <=20 SENSITIVE Sensitive     PIP/TAZO <=4 SENSITIVE Sensitive     * ESCHERICHIA COLI  Urinalysis, Routine w reflex microscopic     Status: Abnormal   Collection Time: 03/25/14  1:53 PM  Result Value Ref Range   Color, Urine YELLOW YELLOW   APPearance CLOUDY (A) CLEAR   Specific Gravity, Urine 1.011 1.005 - 1.030   pH 7.5 5.0 - 8.0   Glucose, UA NEGATIVE NEGATIVE mg/dL   Hgb urine dipstick TRACE (A) NEGATIVE   Bilirubin  Urine NEGATIVE NEGATIVE   Ketones, ur NEGATIVE NEGATIVE mg/dL   Protein, ur NEGATIVE NEGATIVE mg/dL   Urobilinogen, UA 0.2 0.0 - 1.0 mg/dL   Nitrite POSITIVE (A) NEGATIVE   Leukocytes, UA LARGE (A) NEGATIVE  Urine microscopic-add on     Status: Abnormal   Collection Time: 03/25/14  1:53 PM  Result Value Ref Range   WBC, UA 11-20 <3 WBC/hpf   Bacteria, UA MANY (A) RARE  Protime-INR     Status: Abnormal   Collection Time: 03/27/14  7:35 AM  Result Value Ref Range   Prothrombin Time 29.5 (H) 11.6 - 15.2 seconds   INR 2.77 (H) 0.00 - 1.49  CBC     Status: Abnormal   Collection Time: 03/27/14  7:35 AM  Result Value Ref Range   WBC 5.8 4.0 - 10.5 K/uL   RBC 3.66 (L) 3.87 - 5.11 MIL/uL   Hemoglobin 10.8 (L) 12.0 - 15.0 g/dL   HCT 35.6 (L) 36.0 - 46.0 %   MCV 97.3 78.0 - 100.0 fL   MCH 29.5 26.0 - 34.0 pg   MCHC 30.3 30.0 - 36.0 g/dL   RDW 18.4 (H) 11.5 - 15.5 %   Platelets 457 (H) 150 - 400 K/uL     HEENT: tongue midline,  neck vein prominence Cardio: RRR and no murmur Resp: CTA  B/L and unlabored GI: BS positive and NT< ND Extremity:  Edema LUE> LLE Skin:   Bruise multiple ecchymotic areas BUE Neuro: alert,Unable to close eyes to command  Cranial Nerve Abnormalities RIght central 7, Abnormal Sensory cannot assess due to aphasia, Abnormal Motor 0/5 RUE, 0/5  RLE, antigravity on Left but has receptive difficulties and apraxia limiting MMT, Dysarthric, MAS 3 in R pec and PT Aphasic and Apraxic Musc/Skel:  No pain with RUE ROM Gen NAD,    Assessment/Plan: 1. Functional deficits secondary to Large left MCA infarct embolic which require 3+ hours per day of interdisciplinary therapy in a comprehensive inpatient rehab setting. Physiatrist is providing close team supervision and 24 hour management of active medical problems listed below. Physiatrist and rehab team continue to assess barriers to discharge/monitor patient progress toward functional and medical goals.  FIM: FIM -  Bathing Bathing Steps Patient Completed: Chest, Right Arm, Left upper leg Bathing: 1: Total-Patient completes 0-2 of 10 parts or less than 25%  FIM - Upper Body Dressing/Undressing Upper body dressing/undressing steps patient completed: Thread/unthread left bra strap, Thread/unthread left sleeve of pullover shirt/dress Upper body dressing/undressing: 1: Total-Patient completed less than 25% of tasks FIM - Lower Body Dressing/Undressing Lower body dressing/undressing: 1: Two helpers  FIM - Toileting Toileting: 1: Two helpers  FIM - Radio producer Devices: Engineer, civil (consulting) Customer service manager) Toilet Transfers: 1-Mechanical lift, 1-Two helpers  FIM - Control and instrumentation engineer Devices: Arm rests Bed/Chair Transfer: 1: Mechanical lift  FIM - Locomotion: Wheelchair Locomotion: Wheelchair: 1: Travels less than 50 ft with moderate assistance (Pt: 50 - 74%) FIM - Locomotion: Ambulation Locomotion: Ambulation: 0: Activity did not occur  Comprehension Comprehension Mode: Auditory Comprehension: 2-Understands basic 25 - 49% of the time/requires cueing 51 - 75% of the time  Expression Expression Mode: Nonverbal Expression Assistive Devices:  (head nods) Expression: 1-Expresses basis less than 25% of the time/requires cueing greater than 75% of the time.  Social Interaction Social Interaction: 2-Interacts appropriately 25 - 49% of time - Needs frequent redirection.  Problem Solving Problem Solving: 2-Solves basic 25 - 49% of the time - needs direction more than half the time to initiate, plan or complete simple activities  Memory Memory Mode: Not assessed (unable to assess because of aphasia) Memory: 1-Recognizes or recalls less than 25% of the time/requires cueing greater than 75% of the time  Medical Problem List and Plan: 1. Functional deficits secondary to Large L-MCA infarct with dense right hemiplegia, dysphagia and global aphasia.   2.   DVT Prophylaxis/Anticoagulation: Pharmaceutical  Coumadin for DVT 3. Pain Management: tramadol, tylenol, appropriate turning in bed, regular checks for comfort 4. Mood: LCSW to follow for evaluation and support.   5. Neuropsych: This patient is not capable of making decisions on her own behalf. 6. Skin/Wound Care:  Turn patient every 4 hours.  Rehab RN to monitor skin daily.  Maintain adequate nutrition and hydration status.   7. Fluids/Electrolytes/Nutrition:  Monitor I/O.Off IVF recheck BMET in am,per daughter eating better and taking thickened liquids -encourage po, full supervision with feeding 8. Hypertension: normotensive at present 9.  Thyroid mass question carcinoma, further eval as pt recovers from severe CVA 10.  Hx bilateral knee OA, saw ortho as outpt, diclofenac gel 11.  Thrombocytopenia stable- bruising but no obvious hematoma formation 12.  Prob UTI empiric abx until cx back, no allergies LOS (Days) 15 A FACE TO FACE EVALUATION WAS PERFORMED  KIRSTEINS,ANDREW E 03/28/2014, 8:29 AM

## 2014-03-28 NOTE — Plan of Care (Signed)
Problem: RH Balance Goal: LTG Patient will maintain dynamic standing with ADLs (OT) LTG: Patient will maintain dynamic standing balance with assist during activities of daily living (OT)  Downgraded due to slow progress

## 2014-03-29 ENCOUNTER — Inpatient Hospital Stay (HOSPITAL_COMMUNITY): Payer: Medicare Other | Admitting: Occupational Therapy

## 2014-03-29 ENCOUNTER — Inpatient Hospital Stay (HOSPITAL_COMMUNITY): Payer: Medicare Other | Admitting: Physical Therapy

## 2014-03-29 DIAGNOSIS — B9620 Unspecified Escherichia coli [E. coli] as the cause of diseases classified elsewhere: Secondary | ICD-10-CM

## 2014-03-29 DIAGNOSIS — N390 Urinary tract infection, site not specified: Secondary | ICD-10-CM

## 2014-03-29 LAB — CBC
HCT: 37.9 % (ref 36.0–46.0)
Hemoglobin: 11.5 g/dL — ABNORMAL LOW (ref 12.0–15.0)
MCH: 29.9 pg (ref 26.0–34.0)
MCHC: 30.3 g/dL (ref 30.0–36.0)
MCV: 98.4 fL (ref 78.0–100.0)
Platelets: 575 10*3/uL — ABNORMAL HIGH (ref 150–400)
RBC: 3.85 MIL/uL — ABNORMAL LOW (ref 3.87–5.11)
RDW: 18 % — ABNORMAL HIGH (ref 11.5–15.5)
WBC: 7.8 10*3/uL (ref 4.0–10.5)

## 2014-03-29 LAB — PROTIME-INR
INR: 2.67 — ABNORMAL HIGH (ref 0.00–1.49)
Prothrombin Time: 28.6 seconds — ABNORMAL HIGH (ref 11.6–15.2)

## 2014-03-29 NOTE — Progress Notes (Signed)
BLE dopplers revealed DVT R-distal CFV, FV, popliteal vein and DVT L-FV and popliteal vein.  2D echo revealed EF 65-70% with interatrial septal aneurysm.   Protein C, Protein S and lupus anticoagulant negative. Surgery consult recommended for work up once patient stable and question of thyroid malignancy.    Subjective/Complaints: Per OT, difficulty with sequencing, perseverates on certain adl tasks, "washes face 10 times" ROS: cannot obtain due to aphasia Objective: Vital Signs: Blood pressure 129/61, pulse 80, temperature 98.5 F (36.9 C), temperature source Oral, resp. rate 18, height '5\' 5"'  (1.651 m), weight 77.2 kg (170 lb 3.1 oz), SpO2 96 %. No results found. Results for orders placed or performed during the hospital encounter of 03/13/14 (from the past 72 hour(s))  Protime-INR     Status: Abnormal   Collection Time: 03/27/14  7:35 AM  Result Value Ref Range   Prothrombin Time 29.5 (H) 11.6 - 15.2 seconds   INR 2.77 (H) 0.00 - 1.49  CBC     Status: Abnormal   Collection Time: 03/27/14  7:35 AM  Result Value Ref Range   WBC 5.8 4.0 - 10.5 K/uL   RBC 3.66 (L) 3.87 - 5.11 MIL/uL   Hemoglobin 10.8 (L) 12.0 - 15.0 g/dL   HCT 35.6 (L) 36.0 - 46.0 %   MCV 97.3 78.0 - 100.0 fL   MCH 29.5 26.0 - 34.0 pg   MCHC 30.3 30.0 - 36.0 g/dL   RDW 18.4 (H) 11.5 - 15.5 %   Platelets 457 (H) 150 - 400 K/uL  Basic metabolic panel     Status: Abnormal   Collection Time: 03/28/14 10:10 AM  Result Value Ref Range   Sodium 142 135 - 145 mmol/L   Potassium 4.0 3.5 - 5.1 mmol/L   Chloride 108 96 - 112 mmol/L   CO2 26 19 - 32 mmol/L   Glucose, Bld 80 70 - 99 mg/dL   BUN 8 6 - 23 mg/dL   Creatinine, Ser 0.45 (L) 0.50 - 1.10 mg/dL   Calcium 9.5 8.4 - 10.5 mg/dL   GFR calc non Af Amer >90 >90 mL/min   GFR calc Af Amer >90 >90 mL/min    Comment: (NOTE) The eGFR has been calculated using the CKD EPI equation. This calculation has not been validated in all clinical situations. eGFR's persistently <90  mL/min signify possible Chronic Kidney Disease.    Anion gap 8 5 - 15     HEENT: tongue midline,  neck vein prominence Cardio: RRR and no murmur Resp: CTA B/L and unlabored GI: BS positive and NT< ND Extremity:  Edema LUE> LLE Skin:   Bruise multiple ecchymotic areas BUE Neuro: alert,Unable to close eyes to command  Cranial Nerve Abnormalities RIght central 7, Abnormal Sensory cannot assess due to aphasia, Abnormal Motor 0/5 RUE, 0/5  RLE, antigravity on Left but has receptive difficulties and apraxia limiting MMT, Dysarthric, MAS 3 in R pec and PT Aphasic and Apraxic Musc/Skel:  No pain with RUE ROM Gen NAD,    Assessment/Plan: 1. Functional deficits secondary to Large left MCA infarct embolic which require 3+ hours per day of interdisciplinary therapy in a comprehensive inpatient rehab setting. Physiatrist is providing close team supervision and 24 hour management of active medical problems listed below. Physiatrist and rehab team continue to assess barriers to discharge/monitor patient progress toward functional and medical goals.  FIM: FIM - Bathing Bathing Steps Patient Completed: Chest, Right Arm Bathing: 1: Total-Patient completes 0-2 of 10 parts or less than  25%  FIM - Upper Body Dressing/Undressing Upper body dressing/undressing steps patient completed: Thread/unthread left sleeve of pullover shirt/dress, Thread/unthread right bra strap Upper body dressing/undressing: 1: Total-Patient completed less than 25% of tasks FIM - Lower Body Dressing/Undressing Lower body dressing/undressing: 1: Two helpers  FIM - Toileting Toileting: 1: Two helpers  FIM - Radio producer Devices: Recruitment consultant Transfers: 1-Two helpers  FIM - Control and instrumentation engineer Devices: Arm rests Bed/Chair Transfer: 1: Two helpers, 1: Bed > Chair or W/C: Total A (helper does all/Pt. < 25%), 1: Chair or W/C > Bed: Total A (helper does all/Pt.  < 25%)  FIM - Locomotion: Wheelchair Locomotion: Wheelchair: 1: Total Assistance/staff pushes wheelchair (Pt<25%) FIM - Locomotion: Ambulation Locomotion: Ambulation: 0: Activity did not occur  Comprehension Comprehension Mode: Auditory Comprehension: 2-Understands basic 25 - 49% of the time/requires cueing 51 - 75% of the time  Expression Expression Mode: Nonverbal Expression Assistive Devices:  (head nods) Expression: 1-Expresses basis less than 25% of the time/requires cueing greater than 75% of the time.  Social Interaction Social Interaction: 2-Interacts appropriately 25 - 49% of time - Needs frequent redirection.  Problem Solving Problem Solving: 2-Solves basic 25 - 49% of the time - needs direction more than half the time to initiate, plan or complete simple activities  Memory Memory Mode: Not assessed (unable to assess because of aphasia) Memory: 1-Recognizes or recalls less than 25% of the time/requires cueing greater than 75% of the time  Medical Problem List and Plan: 1. Functional deficits secondary to Large L-MCA infarct with dense right hemiplegia, dysphagia and global aphasia.   2.  DVT Prophylaxis/Anticoagulation: Pharmaceutical  Coumadin for DVT 3. Pain Management: tramadol, tylenol, appropriate turning in bed, regular checks for comfort 4. Mood: LCSW to follow for evaluation and support.   5. Neuropsych: This patient is not capable of making decisions on her own behalf. 6. Skin/Wound Care:  Turn patient every 4 hours.  Rehab RN to monitor skin daily.  Maintain adequate nutrition and hydration status.   7. Fluids/Electrolytes/Nutrition:  Monitor I/O.Off IVF recheck BMET in am,per daughter eating better and taking thickened liquids -encourage po, full supervision with feeding 8. Hypertension: normotensive at present 9.  Thyroid mass question carcinoma, further eval as pt recovers from severe CVA 10.  Hx bilateral knee OA, saw ortho as outpt, diclofenac gel 11.   Thrombocytopenia stable- bruising but no obvious hematoma formation 12.  ecoli UTI, sens to keflex will tx x 7d LOS (Days) 16 A FACE TO FACE EVALUATION WAS PERFORMED  KIRSTEINS,ANDREW E 03/29/2014, 8:07 AM

## 2014-03-29 NOTE — Progress Notes (Signed)
Social Work Elease Hashimoto, LCSW Social Worker Signed  Patient Care Conference 03/28/2014  2:04 PM    Expand All Collapse All   Inpatient RehabilitationTeam Conference and Plan of Care Update Date: 03/28/2014   Time: 10;35 AM     Patient Name: Sandy Barrett       Medical Record Number: [id number]  Date of Birth: [birth year] Sex: Female         Room/Bed: 4W20C/4W20C-01 Payor Info: Payor: MEDICARE / Plan: MEDICARE PART A AND B / Product Type: *No Product type* /    Admitting Diagnosis: L MCA CVA   Admit Date/Time:  03/13/2014  4:08 PM Admission Comments: No comment available   Primary Diagnosis:  Cerebral infarction due to embolism of left middle cerebral artery Principal Problem: Cerebral infarction due to embolism of left middle cerebral artery    Patient Active Problem List     Diagnosis  Date Noted   .  Acute pulmonary embolism  03/14/2014   .  DVT of lower extremity, bilateral  03/14/2014   .  Global aphasia  03/14/2014   .  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  Dysphagia due to recent stroke  03/14/2014   .  Aphasia due to stroke  03/13/2014   .  Right hemiparesis  03/13/2014   .  Embolic stroke     .  Cerebral infarction due to embolism of left middle cerebral artery     .  Cerebral infarction due to unspecified mechanism     .  Respiratory failure     .  Cerebral infarction due to thrombosis of right middle cerebral artery     .  Stroke  03/08/2014   .  Pulmonary embolism  03/08/2014   .  CVA (cerebral infarction)     .  Right sided weakness     .  Back pain  08/29/2013   .  Primary localized osteoarthrosis, lower leg  03/06/2013   .  IBS (irritable bowel syndrome)     .  Jugular vein thrombosis  03/01/2011   .  Multinodular goiter  01/13/2011   .  Abnormal chest x-ray  12/12/2010   .  HYPERTHYROIDISM  11/11/2009   .  INSOMNIA  07/03/2009   .  POSTTRAUMATIC STRESS DISORDER  08/22/2008   .  OBESITY  04/21/2008   .  OSTEOARTHRITIS  04/21/2008   .  MIGRAINE  HEADACHE  04/20/2008   .  Essential hypertension  04/20/2008     Expected Discharge Date: Expected Discharge Date: 04/03/14  Team Members Present: Physician leading conference: Dr. Alysia Penna Social Worker Present: Ovidio Kin, LCSW Nurse Present: Heather Roberts, RN PT Present: Georjean Mode, PT;Blair [name], PT OT Present: Simonne Come, Dorothyann Gibbs, OT SLP Present: Windell Moulding, SLP PPS Coordinator present : Daiva Nakayama, RN, CRRN        Current Status/Progress  Goal  Weekly Team Focus   Medical     severe aphasia, apraxia, R HP, DVT on po anticoag, intake increasing  adequate caloric intake   monitor hydration   Bowel/Bladder     Patient is generally incontinent of bowel and bladder, occsionally continent  to be continent of bowel and bladder with mod assist   timed toileting q2h and after meals    Swallow/Nutrition/ Hydration     Dys 1 pudding thick liquids via teasoon, full supervision, hand over hand assist, minimal overt s/s of aspiration   min assist with least restrictive diet  trials of honey thick liquids for liquids progression    ADL's     max assist with bed mobility, +2 with clothing management at bed level and at sit > stand level, +2 for transfers or use of Stedy. Pt's cognitive deficits severly impact her ability to participate in functional tasks, noted improvements in very basic functional tasks.   mod assist overall, most likely will have to downgrade to max transfers and self-care tasks  initiation, Rt attention, transfers, sit <> stand    Mobility     Max A bed mobility, +2A transfers, supervision to Mod A sitting balance  Min A w/c mobility; Mod A bed mobility and transfers   Attention to R side of body, functional transfers, bed mobility, dynamic sitting balance   Communication     total assist for functional communication, receptive > expressive   max assist   multimodal communication, initiation and cessation of verbal and nonverbal communication  attempts, automatic sequences    Safety/Cognition/ Behavioral Observations    mod-max assist sustained attention, initiation, perseveration, poor awareness of verbal errors   max assist for basic tasks   sustained attention, initiation and cessation during basic, familiar tasks    Pain     patient denies pain  pain less than or equal to 4 on a scale of 0-10   assess pain q4h and medicate as indicated    Skin     MASD to bilateral buttocks, boggy heels   prevent fuirther skin injury/breakdown   keep skin clean and dry, bilateral prafo boots when in bed      *See Care Plan and progress notes for long and short-term goals.    Barriers to Discharge:  heavy assist, multiple med issues     Possible Resolutions to Barriers:         Discharge Planning/Teaching Needs:   Daughter's continue to be here and attend therapies with pt.  ALso touring NH's and aware pt will be ready early next week for transfer.        Team Discussion:    Pt making very slow progress, tone better in r-knee. Continues to be plus 2 total assist.  May be able to upgrade diet to honey thick from pudding. Y/N more consistent. Treating UTI. Maintaining her hydration and caloric intake.   Revisions to Treatment Plan:    NHP, downgrade goals to max assist    Continued Need for Acute Rehabilitation Level of Care: The patient requires daily medical management by a physician with specialized training in physical medicine and rehabilitation for the following conditions: Daily direction of a multidisciplinary physical rehabilitation program to ensure safe treatment while eliciting the highest outcome that is of practical value to the patient.: Yes Daily medical management of patient stability for increased activity during participation in an intensive rehabilitation regime.: Yes Daily analysis of laboratory values and/or radiology reports with any subsequent need for medication adjustment of medical intervention for : Neurological  problems;Other  Elease Hashimoto 03/29/2014, 8:56 AM                 Elease Hashimoto, LCSW Social Worker Signed  Patient Care Conference 03/21/2014  1:46 PM    Expand All Collapse All   Inpatient RehabilitationTeam Conference and Plan of Care Update Date: 03/21/2014   Time: 10;50 am     Patient Name: Sandy Barrett       Medical Record Number: [id number]  Date of Birth: [birth year] Sex: Female  Room/Bed: 4W20C/4W20C-01 Payor Info: Payor: MEDICARE / Plan: MEDICARE PART A AND B / Product Type: *No Product type* /    Admitting Diagnosis: L MCA CVA   Admit Date/Time:  03/13/2014  4:08 PM Admission Comments: No comment available   Primary Diagnosis:  Cerebral infarction due to embolism of left middle cerebral artery Principal Problem: Cerebral infarction due to embolism of left middle cerebral artery    Patient Active Problem List     Diagnosis  Date Noted   .  Acute pulmonary embolism  03/14/2014   .  DVT of lower extremity, bilateral  03/14/2014   .  Global aphasia  03/14/2014   .  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  Dysphagia due to recent stroke  03/14/2014   .  Aphasia due to stroke  03/13/2014   .  Right hemiparesis  03/13/2014   .  Embolic stroke     .  Cerebral infarction due to embolism of left middle cerebral artery     .  Cerebral infarction due to unspecified mechanism     .  Respiratory failure     .  Cerebral infarction due to thrombosis of right middle cerebral artery     .  Stroke  03/08/2014   .  Pulmonary embolism  03/08/2014   .  CVA (cerebral infarction)     .  Right sided weakness     .  Back pain  08/29/2013   .  Primary localized osteoarthrosis, lower leg  03/06/2013   .  IBS (irritable bowel syndrome)     .  Jugular vein thrombosis  03/01/2011   .  Multinodular goiter  01/13/2011   .  Abnormal chest x-ray  12/12/2010   .  HYPERTHYROIDISM  11/11/2009   .  INSOMNIA  07/03/2009   .  POSTTRAUMATIC STRESS DISORDER  08/22/2008   .  OBESITY   04/21/2008   .  OSTEOARTHRITIS  04/21/2008   .  MIGRAINE HEADACHE  04/20/2008   .  Essential hypertension  04/20/2008     Expected Discharge Date: Expected Discharge Date: 04/03/14  Team Members Present: Physician leading conference: Dr. Alysia Penna Social Worker Present: Ovidio Kin, LCSW Nurse Present: Heather Roberts, RN PT Present: Georjean Mode, PT;Blair [name], PT OT Present: Simonne Come, Dorothyann Gibbs, OT SLP Present: Windell Moulding, SLP PPS Coordinator present : Daiva Nakayama, RN, CRRN        Current Status/Progress  Goal  Weekly Team Focus   Medical     Severe aphasia, severe apraxia, severe right hemiparesis, DVT requiring anticoagulation  Maintain medical stability during rehabilitation stay  Monitor for signs of bleeding while on dual anticoagulants   Bowel/Bladder     patient is incontinent of bowel and  bladder   to become continent of bowel and bladder with mod assist   offer timed toileting   Swallow/Nutrition/ Hydration     Dys 1, pudding thick liquids via teaspoon, full supervision and hand over hand assist for swallowing precautions and self feeding   min assist with least restrictive diet    diet toleration    ADL's     total assist bed mobility, +2 for clothing management at bed level and at sit > stand level, +2 transfers.  Pt's cogntive deficits severly impacting her ability to participate in functional tasks   mod assist overall  initiation, Rt attention, transfers, sit <> stand    Mobility     Total A bed mobility, Total  to +2A for basic transfers;   Min A bed mobility and w/c mobility; Mod A transfers   Initiation, attention to R visual field/hemi body, functional transfers, management of RLE tone   Communication     total assist for functional communication  max assist   multimodal communication, initiation and cessation of verbal and nonverbal communication   Safety/Cognition/ Behavioral Observations    decreased sustained attention, decreased  alertness, poor initiation with perseveratioin   max assist for basic tasks   sustained attention, initiation and cessation during basic,familiar tasks    Pain     patient denies pain  pain less than or equal to 4 on a scale of 0-10  assess pain q4h and offer medication as indicated    Skin     MASD to bilateral butocks, boggy heels   prevent further skin injury/breakdown   keep skin clean and dry, wear prafo boots when in bed.      *See Care Plan and progress notes for long and short-term goals.    Barriers to Discharge:  Heavy assist level, multiple medical comorbidities     Possible Resolutions to Barriers:   Continue rehabilitation program     Discharge Planning/Teaching Needs:   daughter's here daily but feel best option is NHP upon discharge from rehab for longer rehab and more therapies.       Team Discussion:    Sounds less congested-more alert. Improvement but not enough for FIM change. R-inattention and initiation. Severe global aphasia. DYs 1 pudding thick she is hydrating herself on this diet. Medically needs to be here 3 weeks then to NH   Revisions to Treatment Plan:    Probably NH after rehab for more therapies and time to recover    Continued Need for Acute Rehabilitation Level of Care: The patient requires daily medical management by a physician with specialized training in physical medicine and rehabilitation for the following conditions: Daily direction of a multidisciplinary physical rehabilitation program to ensure safe treatment while eliciting the highest outcome that is of practical value to the patient.: Yes Daily medical management of patient stability for increased activity during participation in an intensive rehabilitation regime.: Yes Daily analysis of laboratory values and/or radiology reports with any subsequent need for medication adjustment of medical intervention for : Neurological problems;Other  Elease Hashimoto 03/22/2014, 3:17 PM                   Patient ID: Sandy Barrett, female   DOB: 23-Dec-1932, 81 y.o.   MRN: [id number]

## 2014-03-29 NOTE — Progress Notes (Signed)
ANTICOAGULATION CONSULT NOTE - Follow Up Consult  Pharmacy Consult for coumadin Indication: DVT/PE  No Known Allergies  Patient Measurements: Height: 5\' 5"  (165.1 cm) Weight: 170 lb 3.1 oz (77.2 kg) IBW/kg (Calculated) : 57 Heparin Dosing Weight:   Vital Signs: Temp: 98.5 F (36.9 C) (03/24 0542) Temp Source: Oral (03/24 0542) BP: 129/61 mmHg (03/24 0542) Pulse Rate: 80 (03/24 0542)  Labs:  Recent Labs  03/27/14 0735 03/28/14 1010 03/29/14 1005  HGB 10.8*  --  11.5*  HCT 35.6*  --  37.9  PLT 457*  --  575*  LABPROT 29.5*  --  28.6*  INR 2.77*  --  2.67*  CREATININE  --  0.45*  --     Estimated Creatinine Clearance: 58.6 mL/min (by C-G formula based on Cr of 0.45).   Medications:  Scheduled:  . antiseptic oral rinse  7 mL Mouth Rinse BID  . cephALEXin  250 mg Oral 3 times per day  . chlorhexidine  15 mL Mouth Rinse BID  . diclofenac sodium  2 g Topical QID  . feeding supplement (ENSURE)  1 Container Oral TID BM  . iron polysaccharides  150 mg Oral BID AC  . pantoprazole sodium  40 mg Per Tube BID  . warfarin  4 mg Oral q1800  . Warfarin - Pharmacist Dosing Inpatient   Does not apply q1800   Infusions:    Assessment: 81 yo female with DVT/PE is currently on therapeutic coumadin.  INR today is 2.67.  Goal of Therapy:  INR 2-3 Monitor platelets by anticoagulation protocol: Yes   Plan:  - Coumadin 4 mg daily -INR TTS  Maryanna Shape, PharmD, BCPS  Clinical Pharmacist  Pager: [phone]   03/29/2014,2:13 PM

## 2014-03-29 NOTE — Progress Notes (Signed)
Physical Therapy Session Note  Patient Details  Name: Sandy Barrett MRN: [id number] Date of Birth: [birth year]  Today's Date: 03/29/2014 PT Individual Time: 1008-1108 PT Individual Time Calculation (min): 60 min   Short Term Goals: Week 2:  PT Short Term Goal 1 (Week 2): Pt will perform supine > sit with max A and 75% cueing with HOB flat using rail. PT Short Term Goal 2 (Week 2): Pt will perform sit > supine with max A and 75% cueing with HOB flat. PT Short Term Goal 3 (Week 2): Pt will consistently transfer from bed <> w/c with max A and 75% cueing. PT Short Term Goal 4 (Week 2): Pt will perform dynamic standing balance x3 consecutive minutes with LE support, without UE support requiring SBA and 25% cueing.  Skilled Therapeutic Interventions/Progress Updates:    2:1. Pt received seated in tilt-in-space w/c. Session focused on w/c mobility and functional transfers. Transported pt in tilt-in-space w/c to treatment gym, where pt performed squat pivot transfers from tilt-in-space w/c <> bed > L one arm drive w/c, then from L one arm drive w/c > tilt-in-space with +2Total A using hand-over-back method, requiring manual facilitation of anterior weight shift (secondary to increased posterior pushing of trunk), manual stabilization of RLE to maintain foot position on floor (due to extensor tone consistently occuring during transfer). Of note, R knee extensor tone much more prevalent and more hindering to functional transfers during this session as compared with previous sessions, as onset of R knee extension was consistently followed by pt forcing trunk posteriorly, so much so that this PT and SPT had difficulty breaking posterior pushing; question active pushing vs. extensor tone. RN made aware. While in L one arm drive w/c, pt performed self-propulsion with max A, hand-over-hand assist to orient pt to technique for w/c, increasing excursion of hand on w/c hand rim. W/c mobility trial ended secondary to  time constraint. Session ended in pt room, where pt was left seated in tilt-in-space w/c with quick release belt in place for safety and soft call bell/all needs within reach.  Therapy Documentation Precautions:  Precautions Precautions: Fall Precaution Comments: [name] Restrictions Weight Bearing Restrictions: No Other Position/Activity Restrictions: R shoulder with sublux Pain: Pain Assessment Pain Assessment: Faces Faces Pain Scale: Hurts little more Pain Type: Acute pain Pain Location: Shoulder Pain Orientation: Right Pain Descriptors / Indicators: Grimacing Pain Onset: With Activity Pain Intervention(s): Repositioned;RN made aware;Other (Comment) (R half lap tray on w/c; used R shoulder sling during therapy) Locomotion : Wheelchair Mobility Distance: 20   See FIM for current functional status  Therapy/Group: Individual Therapy  [name], [name] 03/29/2014, 12:31 PM

## 2014-03-29 NOTE — Progress Notes (Signed)
Speech Language Pathology Daily Session Note  Patient Details  Name: Sandy Barrett MRN: [id number] Date of Birth: March 04, 1932  Today's Date: 03/29/2014 SLP Co-treatment Time: 1330-1400 SLP Co-treatment Time Calculation (min): 30 min  Short Term Goals: Week 3: SLP Short Term Goal 1 (Week 3): Pt will improve accuracy of  yes/no responses to basic, biographical questions to >50% with mod-max assist gestural cues   SLP Short Term Goal 2 (Week 3): Pt will sustain attention to therapist or a basic, familiar task for 1-2 seconds with max assist verbal and tactile cues for redirection.   SLP Short Term Goal 3 (Week 3): Pt will complete verbal automatic sequences for 25-50% accuracy of approximations with max assist demonstration cues.   SLP Short Term Goal 4 (Week 3): Pt will attend to the right of the environment during basic, familiar tasks with mod assist verbal and tactile cues.   SLP Short Term Goal 5 (Week 3): Pt will follow 1 step commands during basic, familiar tasks for 50% accuracy with max assist demonstration cues.   SLP Short Term Goal 6 (Week 3): Pt will consume dys 1 textures and honey thick liquids with mod assist verbal and visual cues to monitor and correct anterior loss of materials and utilize rate and portion control over three targeted sessions prior to diet advancement.     Skilled Therapeutic Interventions:  Pt was seen for skilled ST/OT co-treat targeting cognitive-linguistic goals.  Pt was seated upright in wheelchair with OT upon arrival.   See OT notes for specific goals and interventions.  SLP facilitated the session with a basic task targeting initiation and sustained attention.  Pt placed like items into a container with max to total assist verbal, visual, and tactile cues for initiation.  Pt was also ~25-50% accurate for generating approximations of initial bilabial consonants during basic, familiar automatic sequences with mod assist demonstration cues.  Pt also answered  immediate, environmental yes/no questions for >25% accuracy in functional contexts with mod-max assist verbal and visual cues.  Continue per current plan of care.    FIM:  Comprehension Comprehension Mode: Auditory Comprehension: 2-Understands basic 25 - 49% of the time/requires cueing 51 - 75% of the time Expression Expression Mode: Nonverbal Expression: 1-Expresses basis less than 25% of the time/requires cueing greater than 75% of the time. Social Interaction Social Interaction: 2-Interacts appropriately 25 - 49% of time - Needs frequent redirection. Problem Solving Problem Solving: 2-Solves basic 25 - 49% of the time - needs direction more than half the time to initiate, plan or complete simple activities Memory Memory: 1-Recognizes or recalls less than 25% of the time/requires cueing greater than 75% of the time  Pain Pain Assessment Pain Assessment: Faces Faces Pain Scale: No hurt  Therapy/Group: Other: ST/OT co-tx   Emilio Math 03/29/2014, 8:22 PM

## 2014-03-29 NOTE — Progress Notes (Signed)
Occupational Therapy Session Note  Patient Details  Name: Sandy Barrett MRN: [id number] Date of Birth: 30-Oct-1932  Today's Date: 03/29/2014 OT Individual Time: 7322-0254 and 2706-2376 (co-tx with SLP 1330-1400) OT Individual Time Calculation (min): 60 min and 30 min  Short Term Goals: Week 3:  OT Short Term Goal 1 (Week 3): Pt will initiate sit > stand with use of lift [facility]) for toilet transfer with mod verbal cues and mod assist OT Short Term Goal 2 (Week 3): Pt will initiate UB bathing with mod tactile and setup cues OT Short Term Goal 3 (Week 3): Pt will roll in bed with mod multimodal cues and max assist to assist caregiver with LB bathing  Skilled Therapeutic Interventions/Progress Updates:    1) Engaged in ADL retraining with focus on initiation, sequencing, and increased participation in self-care tasks of bathing, dressing, and self-feeding.  Pt received in bed with nurse tech reporting just cleaning from incontinence of urine.  Engaged in bathing and dressing seated at EOB with focus on sitting balance and increased initiation and following directions.  Pt demonstrating perseveration with washing her face multiple times, despite verbal, tactile, and hand over hand cue to wash chest and progress to UE. Pt did spontaneously wash Rt hand when positioned RUE onto table.  Pt with difficulty terminating tasks.  Provided pt with shirt to which she thread LUE through head hole and then unable to correct with cues to thread through arm hole.  When donning pants, pt initially attempted to don pants on Rt arm but when redirected pt immediately lifted LLE while maintaining sitting balance for therapist to thread pant leg.  Sit > stand to pull pants over hips with pt requiring total assist of one for sit > stand and maintain standing balance and 2nd person to pull pants over hips.  Squat pivot +2 to Rt side with over the back technique to promote forward weight shift, manual stabilization at RLE due  to extensor tone.  Provided pt with plate guard to increase success with self-feeding as pt tends to scoop food off plate, utilized with self-feeding this session with increased success, pt continues to require cues for portion control and attention to Rt side of face to wipe.  2) Therapeutic activity with focus on functional transfers and sit <> stand.  Encouraged pt to complete toilet transfer to address functional transfers with squat pivot w/c > drop arm BSC.  +2 assist for anterior weight shift, blocking at Rt knee, and positioning.  Multiple sit <> stands from [facility] for hygiene with therapist providing manual stabilization at RLE due to extensor tone and tactile cues to promote upright standing while 2nd person completed hygiene.  During sit <> stands pt with increased vocalizations resembling 1, 2, 3 in preparation for standing.  Completed hand hygiene post toileting with total assist for thoroughness.  SLP joined for 1330-1400 with focus on initiation, vocalizations, and focused progressing to sustained attention with sorting task in ADL kitchen.  See SLP note for further information.  Therapy Documentation Precautions:  Precautions Precautions: Fall Precaution Comments: [name] Restrictions Weight Bearing Restrictions: No Other Position/Activity Restrictions: R shoulder with sublux General:   Vital Signs:   Pain:  Pt with no c/o pain  See FIM for current functional status  Therapy/Group: Individual Therapy and Co-Treatment  Sandy Barrett, [facility] 03/29/2014, 3:19 PM

## 2014-03-30 ENCOUNTER — Inpatient Hospital Stay (HOSPITAL_COMMUNITY): Payer: Medicare Other | Admitting: Occupational Therapy

## 2014-03-30 ENCOUNTER — Inpatient Hospital Stay (HOSPITAL_COMMUNITY): Payer: Medicare Other | Admitting: Physical Therapy

## 2014-03-30 ENCOUNTER — Ambulatory Visit (HOSPITAL_COMMUNITY): Payer: Medicare Other | Admitting: Speech Pathology

## 2014-03-30 NOTE — Progress Notes (Signed)
BLE dopplers revealed DVT R-distal CFV, FV, popliteal vein and DVT L-FV and popliteal vein.  2D echo revealed EF 65-70% with interatrial septal aneurysm.   Protein C, Protein S and lupus anticoagulant negative. Surgery consult recommended for work up once patient stable and question of thyroid malignancy.    Subjective/Complaints: DIfificult to positin in [place], needs assist of 2+ ROS: cannot obtain due to aphasia Objective: Vital Signs: Blood pressure 114/62, pulse 88, temperature 98.4 F (36.9 C), temperature source Oral, resp. rate 18, height '5\' 5"'  (1.651 m), weight 77.2 kg (170 lb 3.1 oz), SpO2 97 %. No results found. Results for orders placed or performed during the hospital encounter of 03/13/14 (from the past 72 hour(s))  Basic metabolic panel     Status: Abnormal   Collection Time: 03/28/14 10:10 AM  Result Value Ref Range   Sodium 142 135 - 145 mmol/L   Potassium 4.0 3.5 - 5.1 mmol/L   Chloride 108 96 - 112 mmol/L   CO2 26 19 - 32 mmol/L   Glucose, Bld 80 70 - 99 mg/dL   BUN 8 6 - 23 mg/dL   Creatinine, Ser 0.45 (L) 0.50 - 1.10 mg/dL   Calcium 9.5 8.4 - 10.5 mg/dL   GFR calc non Af Amer >90 >90 mL/min   GFR calc Af Amer >90 >90 mL/min    Comment: (NOTE) The eGFR has been calculated using the CKD EPI equation. This calculation has not been validated in all clinical situations. eGFR's persistently <90 mL/min signify possible Chronic Kidney Disease.    Anion gap 8 5 - 15  Protime-INR     Status: Abnormal   Collection Time: 03/29/14 10:05 AM  Result Value Ref Range   Prothrombin Time 28.6 (H) 11.6 - 15.2 seconds   INR 2.67 (H) 0.00 - 1.49  CBC     Status: Abnormal   Collection Time: 03/29/14 10:05 AM  Result Value Ref Range   WBC 7.8 4.0 - 10.5 K/uL   RBC 3.85 (L) 3.87 - 5.11 MIL/uL   Hemoglobin 11.5 (L) 12.0 - 15.0 g/dL   HCT 37.9 36.0 - 46.0 %   MCV 98.4 78.0 - 100.0 fL   MCH 29.9 26.0 - 34.0 pg   MCHC 30.3 30.0 - 36.0 g/dL   RDW 18.0 (H) 11.5 - 15.5 %   Platelets  575 (H) 150 - 400 K/uL     HEENT: tongue midline,  neck vein prominence Cardio: RRR and no murmur Resp: CTA B/L and unlabored GI: BS positive and NT< ND Extremity:  Edema LUE> LLE Skin:   Bruise multiple ecchymotic areas BUE Neuro: alert,Unable to close eyes to command  Cranial Nerve Abnormalities RIght central 7, Abnormal Sensory cannot assess due to aphasia, Abnormal Motor 0/5 RUE, 0/5  RLE, antigravity on Left but has receptive difficulties and apraxia limiting MMT, Dysarthric, MAS 2 in R pec and biceps  Aphasic and Apraxic Musc/Skel:  No pain with RUE ROM Gen NAD,    Assessment/Plan: 1. Functional deficits secondary to Large left MCA infarct embolic which require 3+ hours per day of interdisciplinary therapy in a comprehensive inpatient rehab setting. Physiatrist is providing close team supervision and 24 hour management of active medical problems listed below. Physiatrist and rehab team continue to assess barriers to discharge/monitor patient progress toward functional and medical goals.  FIM: FIM - Bathing Bathing Steps Patient Completed: Chest Bathing: 1: Total-Patient completes 0-2 of 10 parts or less than 25%  FIM - Upper Body Dressing/Undressing Upper body  dressing/undressing steps patient completed: Thread/unthread left sleeve of pullover shirt/dress, Thread/unthread right bra strap Upper body dressing/undressing: 1: Total-Patient completed less than 25% of tasks FIM - Lower Body Dressing/Undressing Lower body dressing/undressing: 1: Two helpers  FIM - Toileting Toileting: 1: Two helpers  FIM - Radio producer Devices: Recruitment consultant Transfers: 1-Two helpers  FIM - Control and instrumentation engineer Devices: Arm rests Bed/Chair Transfer: 1: Two helpers  FIM - Locomotion: Wheelchair Distance: 20 Locomotion: Wheelchair: 1: Travels less than 50 ft with maximal assistance (Pt: 25 - 49%) FIM - Locomotion:  Ambulation Locomotion: Ambulation: 0: Activity did not occur  Comprehension Comprehension Mode: Auditory Comprehension: 2-Understands basic 25 - 49% of the time/requires cueing 51 - 75% of the time  Expression Expression Mode: Nonverbal Expression Assistive Devices:  (head nods) Expression: 1-Expresses basis less than 25% of the time/requires cueing greater than 75% of the time.  Social Interaction Social Interaction: 2-Interacts appropriately 25 - 49% of time - Needs frequent redirection.  Problem Solving Problem Solving: 2-Solves basic 25 - 49% of the time - needs direction more than half the time to initiate, plan or complete simple activities  Memory Memory Mode: Not assessed (unable to assess because of aphasia) Memory: 1-Recognizes or recalls less than 25% of the time/requires cueing greater than 75% of the time  Medical Problem List and Plan: 1. Functional deficits secondary to Large L-MCA infarct with dense right hemiplegia, dysphagia and global aphasia.   2.  DVT Prophylaxis/Anticoagulation: Pharmaceutical  Coumadin for DVT 3. Pain Management: tramadol, tylenol, appropriate turning in bed, regular checks for comfort 4. Mood: LCSW to follow for evaluation and support.   5. Neuropsych: This patient is not capable of making decisions on her own behalf. 6. Skin/Wound Care:  Turn patient every 4 hours.  Rehab RN to monitor skin daily.  Maintain adequate nutrition and hydration status.   7. Fluids/Electrolytes/Nutrition:  Monitor I/O.Off IVF recheck BMET in am,per daughter eating better and taking thickened liquids -encourage po, full supervision with feeding 8. Hypertension: normotensive at present 9.  Thyroid mass question carcinoma, further eval as pt recovers from severe CVA 10.  Hx bilateral knee OA, saw ortho as outpt, diclofenac gel 11.  Thrombocytopenia stable- bruising but no obvious hematoma formation 12.  ecoli UTI, sens to keflex will tx x 7d LOS (Days) 17 A FACE  TO FACE EVALUATION WAS PERFORMED  Kuzey Ogata E 03/30/2014, 7:36 AM

## 2014-03-30 NOTE — Progress Notes (Signed)
Physical Therapy Session Note  Patient Details  Name: Sandy Barrett MRN: [id number] Date of Birth: 11/08/1932  Today's Date: 03/30/2014 PT Individual Time: 2025-4270 PT Individual Time Calculation (min): 15 min   Short Term Goals: Week 2:  PT Short Term Goal 1 (Week 2): Pt will perform supine > sit with max A and 75% cueing with HOB flat using rail. PT Short Term Goal 2 (Week 2): Pt will perform sit > supine with max A and 75% cueing with HOB flat. PT Short Term Goal 3 (Week 2): Pt will consistently transfer from bed <> w/c with max A and 75% cueing. PT Short Term Goal 4 (Week 2): Pt will perform dynamic standing balance x3 consecutive minutes with LE support, without UE support requiring SBA and 25% cueing.  Skilled Therapeutic Interventions/Progress Updates:    Pt received semi reclined in bed, accompanied by daughter. Pt adamantly refusing to participate in any type of therapy, despite max encouragement, coaxing. Educated pt and daughter on importance of participation in therapies to maximize functional gains and to provide pressure relief, preserve skin integrity. With continued pt refusal, daughter explained perception of pt being left seated in recliner for "too long" earlier today, causing what daughter perceived as discomfort and fatigue. Discussed with daughter that pt positioning in recliner is not ideal, especially with increased RLE tone often causing posterior pelvic tilt. Daughter agreeable to suggestion for pt remaining in tilt-in-space between therapies and having nursing staff change position of w/c back every 15-30 minutes. Spoke with nursing regarding recommendations. Departed with pt semi reclined in bed with 3 rails up, bed alarm on, daughter present, and soft call bell within reach.  Pt missing 45 minutes of skilled physical therapy secondary to pt refusal.   Therapy Documentation Precautions:  Precautions Precautions: Fall Precaution Comments: Lt gaze preference/Rt  neglect Restrictions Weight Bearing Restrictions: No Other Position/Activity Restrictions: R shoulder with sublux General: PT Amount of Missed Time (min): 45 Minutes PT Missed Treatment Reason: Patient unwilling to participate  Pain: Pain Assessment Pain Assessment: No/denies pain  See FIM for current functional status  Therapy/Group: Individual Therapy  [name], [name] 03/30/2014, 3:22 PM

## 2014-03-30 NOTE — Progress Notes (Signed)
Social Work Patient ID: Sandy Barrett, female   DOB: 1932-05-06, 81 y.o.   MRN: [id number] Met with pt and daughter they have discussed the discharge plan with pt and she is agreeable.  Daughter's have chosen [facility] for the facility they would like their mother to Go to after rehab.  Spoke with [facility] she will have a bed on Thursday for pt.   Daughter's plan to stay the first couple nights with her there. Continue to work on discharge plans.

## 2014-03-30 NOTE — Progress Notes (Signed)
Speech Language Pathology Daily Session Note  Patient Details  Name: Sandy Barrett MRN: [id number] Date of Birth: 1932/09/15  Today's Date: 03/30/2014 SLP Individual Time: 0906-1006 SLP Individual Time Calculation (min): 60 min  Short Term Goals: Week 3: SLP Short Term Goal 1 (Week 3): Pt will improve accuracy of  yes/no responses to basic, biographical questions to >50% with mod-max assist gestural cues   SLP Short Term Goal 2 (Week 3): Pt will sustain attention to therapist or a basic, familiar task for 1-2 seconds with max assist verbal and tactile cues for redirection.   SLP Short Term Goal 3 (Week 3): Pt will complete verbal automatic sequences for 25-50% accuracy of approximations with max assist demonstration cues.   SLP Short Term Goal 4 (Week 3): Pt will attend to the right of the environment during basic, familiar tasks with mod assist verbal and tactile cues.   SLP Short Term Goal 5 (Week 3): Pt will follow 1 step commands during basic, familiar tasks for 50% accuracy with max assist demonstration cues.   SLP Short Term Goal 6 (Week 3): Pt will consume dys 1 textures and honey thick liquids with mod assist verbal and visual cues to monitor and correct anterior loss of materials and utilize rate and portion control over three targeted sessions prior to diet advancement.     Skilled Therapeutic Interventions: Skilled treatment session focused on swallow, cognition, and speech goals. SLP facilitated session by providing diagnostic treatment of patient's dysphagia with breakfast tray.  Patient required mod-total assistance with meal when implementing swallow strategies (i.e. small sips/bites, alternating solids/liquids, decreasing rate of intake).  Patient's level assistance was dependent on the texture of the puree; the drier the consistency of the puree the more assistance patient required implementing swallow strategies.  Patient required max-total assistance with full supervision given  cup sips of honey thick liquid due to patient demonstrating impulsive behavior characterized by consistently taking big gulps.  Speech therapist noted 1 cough with honey thick liquids with big sips.  When provided max-total assistance with small sips with mod-max right-sided anterior oral spillage, no overt s/s of aspiration observed.  Patient required mod-max assistance attending to the ride side to remove oral spillage from chin.  Speech therapist provided family education to patient's daughter regarding positioning during meals, when patient can take honey thick liquids by cup/straw, and multiple swallow strategies patient requires to safely tolerate recommended diet.  Therapist recommended continuing dysphagia 1 diet with honey thick liquids.  Patient will have honey thick liquids by cup with SLP and family; however, patient will remain on honey thick liquid by spoon with RN staff.  Patient answered yes/no questions with mod-max assistance with functional and biographical information.  Patient followed 1 step commands with max assistance when educating patient on the use of call bell.  Patient required max assistance completing verbal automatic sequencing; therapist targeted with song and automatic speech (i.e. counting, days of week, prayer).  Patient imitated lip movement and infrequent vocalized along with speech tasks.  Continue with current plan of care.     FIM:  Comprehension Comprehension Mode: Auditory Comprehension: 2-Understands basic 25 - 49% of the time/requires cueing 51 - 75% of the time Expression Expression Mode: Nonverbal Expression: 1-Expresses basis less than 25% of the time/requires cueing greater than 75% of the time. Social Interaction Social Interaction: 2-Interacts appropriately 25 - 49% of time - Needs frequent redirection. Problem Solving Problem Solving: 2-Solves basic 25 - 49% of the time - needs direction more  than half the time to initiate, plan or complete simple  activities Memory Memory: 1-Recognizes or recalls less than 25% of the time/requires cueing greater than 75% of the time FIM - Eating Eating Activity: 4: Help with managing cup/glass;4: Helper checks for pocketed food;4: Helper occasionally scoops food on utensil;5: Needs verbal cues/supervision;5: Set-up assist for open containers  Pain Pain Assessment Pain Assessment: Faces Faces Pain Scale: Hurts little more Pain Type: Acute pain Pain Location: Leg Pain Orientation: Right Pain Descriptors / Indicators: Grimacing Pain Onset: With Activity Pain Intervention(s): Repositioned  Therapy/Group: Individual Therapy  Rushie Goltz 03/30/2014, 1:02 PM

## 2014-03-30 NOTE — Progress Notes (Signed)
Occupational Therapy Session Note  Patient Details  Name: Sandy Barrett MRN: [id number] Date of Birth: December 17, 1932  Today's Date: 03/30/2014 OT Individual [id number]  - [id number]   60 minutes intervention   Short Term Goals: Week 3:  OT Short Term Goal 1 (Week 3): Pt will initiate sit > stand with use of lift [facility]) for toilet transfer with mod verbal cues and mod assist OT Short Term Goal 2 (Week 3): Pt will initiate UB bathing with mod tactile and setup cues OT Short Term Goal 3 (Week 3): Pt will roll in bed with mod multimodal cues and max assist to assist caregiver with LB bathing  Skilled Therapeutic Interventions/Progress Updates:  Upon entering the room, pt seated in wheelchair with NT present in room. NT reporting they just cleansed and changed pt before utilizing hoyer lift to transfer pt into wheelchair. Pt with no signs or symptoms of pain during session. Skilled OT session with focus on self care retraining, initiation, and sequencing of tasks. Pt seated in wheelchair and at sink side to complete self care tasks. Pt perseverating on washing face multiple times during session even with multiple cues and eventually requiring hand over hand assist to engage in washing other parts. Pt washed R UE when placed onto sink and washcloth placed onto arm. Pt required total A for LB self care as second helper required for clothing management while pt standing with max - total A for balance. Pt placed initiated placing L UE into shirt when handed to her but required assistance with additional parts. Pt seated in wheelchair with QRB donned and call bell within reach upon exiting the room.    Therapy Documentation Precautions:  Precautions Precautions: Fall Precaution Comments: [name] Restrictions Weight Bearing Restrictions: No Other Position/Activity Restrictions: R shoulder with sublux  See FIM for current functional status  Therapy/Group: Individual  Therapy  Phineas Semen 03/30/2014, 12:22 PM

## 2014-03-31 ENCOUNTER — Inpatient Hospital Stay (HOSPITAL_COMMUNITY): Payer: Medicare Other | Admitting: Physical Therapy

## 2014-03-31 LAB — CBC
HCT: 35.3 % — ABNORMAL LOW (ref 36.0–46.0)
Hemoglobin: 10.6 g/dL — ABNORMAL LOW (ref 12.0–15.0)
MCH: 29.2 pg (ref 26.0–34.0)
MCHC: 30 g/dL (ref 30.0–36.0)
MCV: 97.2 fL (ref 78.0–100.0)
Platelets: 618 10*3/uL — ABNORMAL HIGH (ref 150–400)
RBC: 3.63 MIL/uL — ABNORMAL LOW (ref 3.87–5.11)
RDW: 17.6 % — ABNORMAL HIGH (ref 11.5–15.5)
WBC: 7.4 10*3/uL (ref 4.0–10.5)

## 2014-03-31 LAB — PROTIME-INR
INR: 3.06 — ABNORMAL HIGH (ref 0.00–1.49)
Prothrombin Time: 31.9 seconds — ABNORMAL HIGH (ref 11.6–15.2)

## 2014-03-31 MED ORDER — WARFARIN SODIUM 2 MG PO TABS
2.0000 mg | ORAL_TABLET | Freq: Once | ORAL | Status: AC
  Administered 2014-03-31: 2 mg via ORAL
  Filled 2014-03-31: qty 1

## 2014-03-31 NOTE — Plan of Care (Signed)
Problem: RH Bed Mobility Goal: LTG Patient will perform bed mobility with assist (PT) LTG: Patient will perform bed mobility with assistance, with/without cues (PT).  Downgraded due to lack of patient progress.  Problem: RH Bed to Chair Transfers Goal: LTG Patient will perform bed/chair transfers w/assist (PT) LTG: Patient will perform bed/chair transfers with assistance, with/without cues (PT).  Downgraded due to lack of patient progress.  Problem: RH Wheelchair Mobility Goal: LTG Patient will propel w/c in controlled environment (PT) LTG: Patient will propel wheelchair in controlled environment, # of feet with assist (PT)  Downgraded due to lack of patient progress.

## 2014-03-31 NOTE — Progress Notes (Addendum)
Sandy Barrett is a 81 y.o. female 25-Jun-1932 [id number]  Subjective: Per dtr:  No new problems. Slept well.   Objective: Vital signs in last 24 hours: Temp:  [97.8 F (36.6 C)-98.6 F (37 C)] 97.8 F (36.6 C) (03/26 0526) Pulse Rate:  [93-94] 93 (03/26 0526) Resp:  [16-18] 16 (03/26 0526) BP: (104)/(52) 104/52 mmHg (03/26 0526) SpO2:  [95 %-97 %] 95 % (03/26 0526) Weight change:  Last BM Date: 03/29/14  Intake/Output from previous day: 03/25 0701 - 03/26 0700 In: 240 [P.O.:240] Out: -  Last cbgs: CBG (last 3)  No results for input(s): GLUCAP in the last 72 hours.   Physical Exam General: No apparent distress   HEENT: not dry Lungs: Normal effort. Lungs clear to auscultation, no crackles or wheezes. Cardiovascular: Regular rate and rhythm, no edema Abdomen: S/NT/ND; BS(+) Musculoskeletal:  unchanged Neurological: No new neurological deficits; aphasic Wounds: N/A    Skin: clear  Aging changes Mental state: Alert, cooperative    Lab Results: BMET    Component Value Date/Time   NA 142 03/28/2014 1010   K 4.0 03/28/2014 1010   CL 108 03/28/2014 1010   CO2 26 03/28/2014 1010   GLUCOSE 80 03/28/2014 1010   BUN 8 03/28/2014 1010   CREATININE 0.45* 03/28/2014 1010   CALCIUM 9.5 03/28/2014 1010   GFRNONAA >90 03/28/2014 1010   GFRAA >90 03/28/2014 1010   CBC    Component Value Date/Time   WBC 7.8 03/29/2014 1005   RBC 3.85* 03/29/2014 1005   RBC 3.41* 03/14/2014 1052   HGB 11.5* 03/29/2014 1005   HCT 37.9 03/29/2014 1005   PLT 575* 03/29/2014 1005   MCV 98.4 03/29/2014 1005   MCH 29.9 03/29/2014 1005   MCHC 30.3 03/29/2014 1005   RDW 18.0* 03/29/2014 1005   LYMPHSABS 0.9 03/14/2014 0423   MONOABS 0.7 03/14/2014 0423   EOSABS 0.2 03/14/2014 0423   BASOSABS 0.1 03/14/2014 0423    Studies/Results: No results found.  Medications: I have reviewed the patient's current medications.  Assessment/Plan:   1. Functional deficits secondary to Large  L-MCA infarct with dense right hemiplegia, dysphagia and global aphasia.  2. DVT Prophylaxis/Anticoagulation: Pharmaceutical Coumadin for DVT 3. Pain Management: tramadol, tylenol, appropriate turning in bed, regular checks for comfort 4. Mood: LCSW to follow for evaluation and support.  5. Neuropsych: This patient is not capable of making decisions on her own behalf. 6. Skin/Wound Care: Turn patient every 4 hours. Rehab RN to monitor skin daily. Maintain adequate nutrition and hydration status.  7. Fluids/Electrolytes/Nutrition: Monitor I/O.Off IVF recheck BMET in am,per daughter eating better and taking thickened liquids -encourage po, full supervision with feeding 8. Hypertension: normotensive at present 9. Thyroid mass question carcinoma, further eval as pt recovers from severe CVA 10. Hx bilateral knee OA, saw ortho as outpt, diclofenac gel 11. Thrombocytopenia stable- bruising but no obvious hematoma formation 12. ecoli UTI, sens to keflex   Length of stay, days: 18  Walker Kehr , MD 03/31/2014, 9:17 AM

## 2014-03-31 NOTE — Progress Notes (Signed)
ANTICOAGULATION CONSULT NOTE - Follow Up Consult  Pharmacy Consult for coumadin Indication: DVT/PE  No Known Allergies  Patient Measurements: Height: 5\' 5"  (165.1 cm) Weight: 170 lb 3.1 oz (77.2 kg) IBW/kg (Calculated) : 57 Heparin Dosing Weight:   Vital Signs: Temp: 97.8 F (36.6 C) (03/26 0526) Temp Source: Oral (03/26 0526) BP: 104/52 mmHg (03/26 0526) Pulse Rate: 93 (03/26 0526)  Labs:  Recent Labs  03/29/14 1005 03/31/14 0845  HGB 11.5* 10.6*  HCT 37.9 35.3*  PLT 575* 618*  LABPROT 28.6* 31.9*  INR 2.67* 3.06*    Estimated Creatinine Clearance: 58.6 mL/min (by C-G formula based on Cr of 0.45).   Medications:  Scheduled:  . antiseptic oral rinse  7 mL Mouth Rinse BID  . cephALEXin  250 mg Oral 3 times per day  . chlorhexidine  15 mL Mouth Rinse BID  . diclofenac sodium  2 g Topical QID  . feeding supplement (ENSURE)  1 Container Oral TID BM  . iron polysaccharides  150 mg Oral BID AC  . pantoprazole sodium  40 mg Per Tube BID  . warfarin  4 mg Oral q1800  . Warfarin - Pharmacist Dosing Inpatient   Does not apply q1800   Infusions:    Assessment: 81 yo female with DVT/PE on anticoagulation with Coumadin.  Her INR has trended up to 3.06, possibly influenced by cephalexin that she is receiving for a UTI through 3/28.  Goal of Therapy:  INR 2-3 Monitor platelets by anticoagulation protocol: Yes   Plan:  Decrease Coumadin to 2mg  today Check INR with AM labs  Legrand Como, Pharm.D., BCPS, AAHIVP Clinical Pharmacist Phone: [phone] or [phone] 03/31/2014, 2:06 PM

## 2014-03-31 NOTE — Progress Notes (Signed)
Physical Therapy Weekly Progress Note  Patient Details  Name: Sandy Barrett MRN: [id number] Date of Birth: 07-27-1932  Beginning of progress report period: March 24, 2014 End of progress report period: March 31, 2014  Today's Date: 03/31/2014 PT Individual Time: 1420-1505 PT Individual Time Calculation (min): 45 min   Patient has met 2 of 4 short term goals.  Patient continues to r  Patient continues to demonstrate the following deficits: muscle weakness, impaired timing and sequencing, abnormal tone, unbalanced muscle activation, motor apraxia, decreased coordination and decreased motor planning, decreased midline orientation, decreased attention to right, decreased initiation, decreased attention, decreased awareness, decreased problem solving, delayed processing and decreased sitting balance, decreased standing balance, decreased postural control, hemiplegia and decreased balance strategiesand therefore will continue to benefit from skilled PT intervention to enhance overall performance with activity tolerance, balance, postural control, ability to compensate for deficits, functional use of  right lower extremity, awareness and coordination.  Patient not progressing toward long term goals.  See goal revision..  Plan of care revisions: downgraded long term goals addressing bed mobility, basic transfers, and w/c mobility secondary to lack of patient progress,.  PT Short Term Goals Week 1:  PT Short Term Goal 1 (Week 1): Pt will perform supine > sit with max A with HOB flat using rail with 75% cueing. PT Short Term Goal 1 - Progress (Week 1): Progressing toward goal PT Short Term Goal 2 (Week 1): Pt will perform sit > supine with HOB flat requiring max A and 75% cueing. PT Short Term Goal 2 - Progress (Week 1): Progressing toward goal PT Short Term Goal 3 (Week 1): Pt will consistently transfer from bed <> w/c with Max A of single therapist with 75% cueing. PT Short Term Goal 3 - Progress  (Week 1): Progressing toward goal PT Short Term Goal 4 (Week 1):  Pt will perform w/c mobility x25' in controlled environment with Max A and 75% cueing for visual scanning/attention to R visual field. PT Short Term Goal 4 - Progress (Week 1): Discontinued (comment) (Discontinued until tilt-in-space modified to standard w/c) PT Short Term Goal 5 (Week 1):  (Modified goal from "dynamic standing" to "sitting" due to error in original goal set.) PT Short Term Goal 5 - Progress (Week 1): Revised due to lack of progress Week 2:  PT Short Term Goal 1 (Week 2): Pt will perform supine > sit with max A and 75% cueing with HOB flat using rail. PT Short Term Goal 1 - Progress (Week 2): Progressing toward goal PT Short Term Goal 2 (Week 2): Pt will perform sit > supine with max A and 75% cueing with HOB flat. PT Short Term Goal 2 - Progress (Week 2): Met PT Short Term Goal 3 (Week 2): Pt will consistently transfer from bed <> w/c with max A and 75% cueing. PT Short Term Goal 3 - Progress (Week 2): Progressing toward goal PT Short Term Goal 4 (Week 2): Pt will perform dynamic sitting balance x3 consecutive minutes with LE support, without UE support requiring SBA and 25% cueing. PT Short Term Goal 4 - Progress (Week 2): Met Week 3:  PT Short Term Goal 1 (Week 3): STG's = LTG's secondary to anticipated LOS. Awaiting SNF placement.  Skilled Therapeutic Interventions/Progress Updates:    Pt received seated in tilt-in-space w/c accompanied by daughter, Sandy Barrett. Pt nodded head "yes" when asked if agreeable to therapy. Session focused on bed mobility, functional transfers, and initiation. Transported pt to gym in w/c with  total A. Upon setting up transfer in gym, noted loose L w/c brake (addressed during this session). Therefore, transported pt back to room in w/c. At this time, noted pt to have been incontinent of urine. Therefore, pt transferred from w/c > bed with total A using bariatric Stedy but requiring +3 overall  for safety (RN stabilizing w/c, PT managing Stedy, and SPT providing manual stability at RUE and trunk). Noted pt with significant pushing to R side while standing using Stedy. Pt performed sit > supine with HOB flat, no rail requiring Max A with patient-initiated self management of RLE using LUE (>75% effective). While RN assisting with hygiene and changing incontinent brief and clothing, pt performed rolling to R side with min A using bed rail with 75% cueing for sequencing, initiation of L knee flexion and LUE across midline, L hand placement on bed rail. Pt rolled to L side with max A, no rail. At this time, pt refusing (vigorously shaking head "no" at suggestion) to attempt further bed mobility due to fatigue. Provided timer to pt's daughter to increase compliance with changing position of tilt-in-space every 15-30 minutes for preservation of skin integrity. Departed with pt semi reclined in bed with 3 rails up, bed alarm on, daughter present, and soft call bell within reach.  Therapy Documentation Precautions:  Precautions Precautions: Fall Precaution Comments: [name] Restrictions Weight Bearing Restrictions: No Other Position/Activity Restrictions: R shoulder with sublux Vital Signs: Therapy Vitals Temp: 98.3 F (36.8 C) Temp Source: Oral Pulse Rate: 92 Resp: 16 BP: (!) 110/52 mmHg Patient Position (if appropriate): Lying Oxygen Therapy SpO2: 92 % O2 Device: Not Delivered Pain: Pain Assessment Pain Assessment: Faces Faces Pain Scale: No hurt  See FIM for current functional status  Therapy/Group: Individual Therapy  Stefano Gaul 03/31/2014, 5:47 PM

## 2014-04-01 ENCOUNTER — Inpatient Hospital Stay (HOSPITAL_COMMUNITY): Payer: Medicare Other

## 2014-04-01 LAB — PROTIME-INR
INR: 3.25 — ABNORMAL HIGH (ref 0.00–1.49)
Prothrombin Time: 33.4 seconds — ABNORMAL HIGH (ref 11.6–15.2)

## 2014-04-01 NOTE — Progress Notes (Signed)
Physical Therapy Session Note  Patient Details  Name: Sandy Barrett MRN: [id number] Date of Birth: 1932/01/17  Today's Date: 04/01/2014 PT Individual Time: 1300-1345 PT Individual Time Calculation (min): 45 min   Short Term Goals: Week 3:  PT Short Term Goal 1 (Week 3): STG's = LTG's secondary to anticipated LOS. Awaiting SNF placement.  Skilled Therapeutic Interventions/Progress Updates:    Pt received seated in w/c w/ daughter present, nodded "yes" when asked if she wanted to participate in therapy. Session focused on sit<>stands, transfers, bed mobility. Pt transported to gym via [place]. Transferred w/c>mat table w/ use of STEDI w/ +2A, pt able to stand for 10-15 seconds at a time and tolerate sitting in high perch for 2 minutes. Noted significant pushing to R during standing. Pt very fearful initially of coming to stand but able to complete transfer. Pt noted to be incontinent of bowels, so transferred back to w/c w/ STEDI. In room pt transferred w/c>bed w/ slide board and Total A of 2, then moved sit>supine w/ +2 A. Pt's daughter educated on tilting tilt-in-space wheelchair back and return demonstrated with therapist in wheelchair to attempt to extend time pt is able to sit up in chair. Pt left with RN and NT present in bed for hygiene and LB dressing.   Therapy Documentation Precautions:  Precautions Precautions: Fall Precaution Comments: [name] Restrictions Weight Bearing Restrictions: No Other Position/Activity Restrictions: R shoulder with sublux Pain:  No/denies pain (pt shook head no when asked if she was hurting anywhere)  See FIM for current functional status  Therapy/Group: Individual Therapy  Rada Hay  Rada Hay, PT, DPT 04/01/2014, 7:51 AM

## 2014-04-01 NOTE — Progress Notes (Addendum)
ANTICOAGULATION CONSULT NOTE - Follow Up Consult  Pharmacy Consult:  Coumadin Indication: DVT/PE  No Known Allergies  Patient Measurements: Height: 5\' 5"  (165.1 cm) Weight: 170 lb 3.1 oz (77.2 kg) IBW/kg (Calculated) : 57  Vital Signs: Temp: 99.2 F (37.3 C) (03/27 0448) Temp Source: Oral (03/27 0448) BP: 92/58 mmHg (03/27 0620) Pulse Rate: 88 (03/27 0448)  Labs:  Recent Labs  03/31/14 0845 04/01/14 0547  HGB 10.6*  --   HCT 35.3*  --   PLT 618*  --   LABPROT 31.9* 33.4*  INR 3.06* 3.25*    Estimated Creatinine Clearance: 58.6 mL/min (by C-G formula based on Cr of 0.45).    Assessment: 81 yo female with DVT/PE on anticoagulation with Coumadin.  INR continues to trend up despite dose reduction yesterday.  Patient remains on Keflex, which can potentially increase the effect of Coumadin.  No overt bleeding reported.   Goal of Therapy:  INR 2-3    Plan:  - Hold Coumadin today - Daily PT / INR    Gricel Copen D. Mina Marble, PharmD, BCPS Pager:  [phone] 04/01/2014, 11:59 AM

## 2014-04-01 NOTE — Progress Notes (Addendum)
Sandy Barrett is a 81 y.o. female 06/21/1932 [id number]  Subjective: Per dtr: no new problems this am. Slept well.   Objective: Vital signs in last 24 hours: Temp:  [98.3 F (36.8 C)-99.2 F (37.3 C)] 99.2 F (37.3 C) (03/27 0448) Pulse Rate:  [88-92] 88 (03/27 0448) Resp:  [16] 16 (03/27 0448) BP: (88-110)/(52-58) 92/58 mmHg (03/27 0620) SpO2:  [92 %-95 %] 95 % (03/27 0448) Weight change:  Last BM Date: 03/29/14  Intake/Output from previous day: 03/26 0701 - 03/27 0700 In: 720 [P.O.:720] Out: -  Last cbgs: CBG (last 3)  No results for input(s): GLUCAP in the last 72 hours.   Physical Exam General: No apparent distress   HEENT: not dry Lungs: Normal effort. Lungs clear to auscultation, no crackles or wheezes. Cardiovascular: Regular rate and rhythm, no edema Abdomen: S/NT/ND; BS(+) Musculoskeletal:  unchanged Neurological: No new neurological deficits; aphasic Wounds: N/A    Skin: clear  Aging changes Mental state: Alert, cooperative    Lab Results: BMET    Component Value Date/Time   NA 142 03/28/2014 1010   K 4.0 03/28/2014 1010   CL 108 03/28/2014 1010   CO2 26 03/28/2014 1010   GLUCOSE 80 03/28/2014 1010   BUN 8 03/28/2014 1010   CREATININE 0.45* 03/28/2014 1010   CALCIUM 9.5 03/28/2014 1010   GFRNONAA >90 03/28/2014 1010   GFRAA >90 03/28/2014 1010   CBC    Component Value Date/Time   WBC 7.4 03/31/2014 0845   RBC 3.63* 03/31/2014 0845   RBC 3.41* 03/14/2014 1052   HGB 10.6* 03/31/2014 0845   HCT 35.3* 03/31/2014 0845   PLT 618* 03/31/2014 0845   MCV 97.2 03/31/2014 0845   MCH 29.2 03/31/2014 0845   MCHC 30.0 03/31/2014 0845   RDW 17.6* 03/31/2014 0845   LYMPHSABS 0.9 03/14/2014 0423   MONOABS 0.7 03/14/2014 0423   EOSABS 0.2 03/14/2014 0423   BASOSABS 0.1 03/14/2014 0423    Studies/Results: No results found.  Medications: I have reviewed the patient's current medications.  Assessment/Plan:   1. Functional deficits secondary  to Large L-MCA infarct with dense right hemiplegia, dysphagia and global aphasia.  2. DVT Prophylaxis/Anticoagulation: Pharmaceutical Coumadin for DVT 3. Pain Management: tramadol, tylenol, appropriate turning in bed, regular checks for comfort 4. Mood: LCSW to follow for evaluation and support.  5. Neuropsych: This patient is not capable of making decisions on her own behalf. 6. Skin/Wound Care: Turn patient every 4 hours. Rehab RN to monitor skin daily. Maintain adequate nutrition and hydration status.  7. Fluids/Electrolytes/Nutrition: Monitor I/O.Off IVF recheck BMET in am,per daughter eating better and taking thickened liquids -encourage po, full supervision with feeding 8. Hypertension: normotensive at present 9. Thyroid mass question carcinoma, further eval as pt recovers from severe CVA 10. Hx bilateral knee OA, saw ortho as outpt, diclofenac gel 11. Thrombocytopenia stable- bruising but no obvious hematoma formation 12. ecoli UTI, sens to keflex  Cont w/current Rx and therapy  Length of stay, days: [place] , MD 04/01/2014, 8:55 AM

## 2014-04-02 ENCOUNTER — Inpatient Hospital Stay (HOSPITAL_COMMUNITY): Payer: Medicare Other | Admitting: Occupational Therapy

## 2014-04-02 ENCOUNTER — Inpatient Hospital Stay (HOSPITAL_COMMUNITY): Payer: Medicare Other

## 2014-04-02 LAB — PROTIME-INR
INR: 2.49 — ABNORMAL HIGH (ref 0.00–1.49)
Prothrombin Time: 27.2 seconds — ABNORMAL HIGH (ref 11.6–15.2)

## 2014-04-02 NOTE — Progress Notes (Signed)
Occupational Therapy Session Note  Patient Details  Name: Sandy Barrett MRN: [id number] Date of Birth: 04-Mar-1932  Today's Date: 04/02/2014 OT Co-Treatment Time: 1000 (co-tx with PT (BH) 10-11)-1030 OT Co-Treatment Time Calculation (min): 30 min   Short Term Goals: Week 3:  OT Short Term Goal 1 (Week 3): Pt will initiate sit > stand with use of lift [facility]) for toilet transfer with mod verbal cues and mod assist OT Short Term Goal 2 (Week 3): Pt will initiate UB bathing with mod tactile and setup cues OT Short Term Goal 3 (Week 3): Pt will roll in bed with mod multimodal cues and max assist to assist caregiver with LB bathing  Skilled Therapeutic Interventions/Progress Updates:    Pt seen for therapeutic co-tx with PT [facility]) with focus on functional transfers, attention to R, sitting balance, postural control, and activity tolerance. Pt received sitting in w/c. Completed squat pivot transfer w/c>mat table with total A +2. Pt required max multimodal modal cues to assist with managing RUE in preparation for transfer. Engaged in dynamic and static sitting balance with BLEs supported with pt requiring SBA-max assist due to posterior lean. Provided mirror as visual feedback for postural control however seemed to distract pt, with family reporting pt may not like visual feedback of herself without her hair done. Engaged in horseshoe game and ball toss with focus on trunk control and anterior weight shift in preparation for transfers. Pt required max multimodal cues for completing activity with 25% accuracy due to global aphasia. Pt required min-mod A for dynamic sitting balance during ball toss. Pt appeared to really enjoy ball toss as she was smiling and giggling throughout activity. At end of session pt returned to w/c with total A +2 squat pivot transfer. Upon sitting in w/c pt initiated scooting L hip then required total A for managing R hip.   Therapy Documentation Precautions:   Precautions Precautions: Fall Precaution Comments: [name] Restrictions Weight Bearing Restrictions: No Other Position/Activity Restrictions: R shoulder with sublux General:   Vital Signs: Therapy Vitals Temp: 98.3 F (36.8 C) Temp Source: Oral Pulse Rate: 86 Resp: 17 BP: (!) 107/55 mmHg Patient Position (if appropriate): Lying Oxygen Therapy SpO2: 94 % O2 Device: Not Delivered Pain:     See FIM for current functional status  Therapy/Group: Co-Treatment  Demauri Advincula N 04/02/2014, 6:47 AM

## 2014-04-02 NOTE — Plan of Care (Signed)
Problem: RH Balance Goal: LTG Patient will maintain dynamic sitting balance (PT) LTG: Patient will maintain dynamic sitting balance with assistance during mobility activities (PT)  Goal downgraded secondary to limited patient progress.  Problem: RH Wheelchair Mobility Goal: LTG Patient will propel w/c in controlled environment (PT) LTG: Patient will propel wheelchair in controlled environment, # of feet with assist (PT)  Goal downgraded secondary to limited patient progress.

## 2014-04-02 NOTE — Progress Notes (Signed)
NUTRITION FOLLOW UP  INTERVENTION: Continue Ensure Pudding po TID, each supplement provides 170 kcal and 4 grams of protein.  Continue Magic cup TID with meals, each supplement provides 290 kcal and 9 grams of protein.  Encourage adequate PO intake.  NUTRITION DIAGNOSIS: Inadequate oral intake related to dislike of puree diet as evidenced by meal completion of 20-35%; improving  Goal: Pt to meet >/= 90% of their estimated nutrition needs; progressing  Monitor:  PO intake, weight trends, labs, I/O's  81 y.o. female  Admitting Dx: Cerebral infarction due to embolism of left middle cerebral artery  ASSESSMENT: Pt history of HTN, OA, Migraines, hyperlipidemia who was admitted on 03/08/14 with right sided weakness, difficulty talking and right facial droop. MRI brain done revealing patchy areas of acute infarct L-MCA territory and severe punctate foci in R-MCA territory with possible trace SAH in high right frontal lobe. MRA brain with occluded L-ICA and L-MCA.   Pt is on a dysphagia 1 diet with honey thick liquids. Meal completion has been improving with 85-100%. Pt has been consuming her supplements. Will continue with current orders.   Labs and medications reviewed.  Height: Ht Readings from Last 1 Encounters:  03/14/14 5\' 5"  (1.651 m)    Weight: Wt Readings from Last 1 Encounters:  03/28/14 170 lb 3.1 oz (77.2 kg)    BMI:  Body mass index is 28.32 kg/(m^2).  Re-Estimated Nutritional Needs: Kcal: 1800-2000 Protein: 90-100 grams Fluid: 1.8 - 2 L/day  Skin: Incision on right groin, +1 RUE edema, non pitting RLE, LLE edema  Diet Order: DIET - DYS 1 with honey thickened liquids   Intake/Output Summary (Last 24 hours) at 04/02/14 1428 Last data filed at 04/02/14 1235  Gross per 24 hour  Intake    270 ml  Output      0 ml  Net    270 ml    Last BM: 3/27  Labs:   Recent Labs Lab 03/28/14 1010  NA 142  K 4.0  CL 108  CO2 26  BUN 8  CREATININE 0.45*   CALCIUM 9.5  GLUCOSE 80    CBG (last 3)  No results for input(s): GLUCAP in the last 72 hours.  Scheduled Meds: . antiseptic oral rinse  7 mL Mouth Rinse BID  . chlorhexidine  15 mL Mouth Rinse BID  . diclofenac sodium  2 g Topical QID  . feeding supplement (ENSURE)  1 Container Oral TID BM  . iron polysaccharides  150 mg Oral BID AC  . pantoprazole sodium  40 mg Per Tube BID  . Warfarin - Pharmacist Dosing Inpatient   Does not apply q1800    Continuous Infusions:    Past Medical History  Diagnosis Date  . HYPERTENSION   . HYPERTHYROIDISM   . OBESITY   . ARTHRITIS   . Posttraumatic stress disorder   . INSOMNIA   . MIGRAINE HEADACHE   . Hyperthyroidism     s/p I-131 ablation 03/2011 of multinod goiter  . Arthritis     Past Surgical History  Procedure Laterality Date  . Abdominal hysterectomy  1976  . Total hip arthroplasty  1998     right  . Tonsillectomy    . Breast surgery      biopsy  . Radiology with anesthesia Left 03/08/2014    Procedure: RADIOLOGY WITH ANESTHESIA;  Surgeon: Rob Hickman, MD;  Location: [place];  Service: Radiology;  Laterality: Left;    Kallie Locks, MS, RD, LDN Pager #  [phone] After hours/ weekend pager # [phone]

## 2014-04-02 NOTE — Progress Notes (Signed)
BLE dopplers revealed DVT R-distal CFV, FV, popliteal vein and DVT L-FV and popliteal vein.  2D echo revealed EF 65-70% with interatrial septal aneurysm.   Protein C, Protein S and lupus anticoagulant negative. Surgery consult recommended for work up once patient stable and question of thyroid malignancy.      Subjective/Complaints:   Uneventful evening/weekend.  ROS: cannot obtain due to aphasia Objective: Vital Signs: Blood pressure 107/55, pulse 86, temperature 98.3 F (36.8 C), temperature source Oral, resp. rate 17, height 5\' 5"  (1.651 m), weight 77.2 kg (170 lb 3.1 oz), SpO2 94 %. No results found. Results for orders placed or performed during the hospital encounter of 03/13/14 (from the past 72 hour(s))  Protime-INR     Status: Abnormal   Collection Time: 03/31/14  8:45 AM  Result Value Ref Range   Prothrombin Time 31.9 (H) 11.6 - 15.2 seconds   INR 3.06 (H) 0.00 - 1.49  CBC     Status: Abnormal   Collection Time: 03/31/14  8:45 AM  Result Value Ref Range   WBC 7.4 4.0 - 10.5 K/uL   RBC 3.63 (L) 3.87 - 5.11 MIL/uL   Hemoglobin 10.6 (L) 12.0 - 15.0 g/dL   HCT 35.3 (L) 36.0 - 46.0 %   MCV 97.2 78.0 - 100.0 fL   MCH 29.2 26.0 - 34.0 pg   MCHC 30.0 30.0 - 36.0 g/dL   RDW 17.6 (H) 11.5 - 15.5 %   Platelets 618 (H) 150 - 400 K/uL  Protime-INR     Status: Abnormal   Collection Time: 04/01/14  5:47 AM  Result Value Ref Range   Prothrombin Time 33.4 (H) 11.6 - 15.2 seconds   INR 3.25 (H) 0.00 - 1.49     HEENT: tongue midline,  neck vein prominence Cardio: RRR and no murmur Resp: CTA B/L and unlabored GI: BS positive and NT< ND Extremity:  Edema LUE> LLE Skin:   Bruise multiple ecchymotic areas BUE Neuro: alert,Unable to close eyes to command  Cranial Nerve Abnormalities RIght central 7, Abnormal Sensory cannot assess due to aphasia, Abnormal Motor 0/5 RUE, 0/5  RLE, antigravity on Left but has receptive difficulties and apraxia limiting MMT, Dysarthric, MAS 2 in R pec and  biceps  Aphasic and Apraxic Musc/Skel:  No pain with RUE ROM Gen NAD,    Assessment/Plan: 1. Functional deficits secondary to Large left MCA infarct embolic which require 3+ hours per day of interdisciplinary therapy in a comprehensive inpatient rehab setting. Physiatrist is providing close team supervision and 24 hour management of active medical problems listed below. Physiatrist and rehab team continue to assess barriers to discharge/monitor patient progress toward functional and medical goals.  FIM: FIM - Bathing Bathing Steps Patient Completed: Chest, Right lower leg (including foot), Left lower leg (including foot) Bathing: 2: Max-Patient completes 3-4 48f 10 parts or 25-49%  FIM - Upper Body Dressing/Undressing Upper body dressing/undressing steps patient completed: Thread/unthread left sleeve of pullover shirt/dress Upper body dressing/undressing: 2: Max-Patient completed 25-49% of tasks FIM - Lower Body Dressing/Undressing Lower body dressing/undressing: 1: Two helpers  FIM - Toileting Toileting: 1: Two helpers  FIM - Radio producer Devices: Recruitment consultant Transfers: 1-Two helpers  FIM - Control and instrumentation engineer Devices: Arm rests Bed/Chair Transfer: 7: Independent: No helper  FIM - Locomotion: Wheelchair Distance: 20 Locomotion: Wheelchair: 1: Total Assistance/staff pushes wheelchair (Pt<25%) FIM - Locomotion: Ambulation Locomotion: Ambulation: 0: Activity did not occur  Comprehension Comprehension Mode: Auditory Comprehension: 2-Understands  basic 25 - 49% of the time/requires cueing 51 - 75% of the time  Expression Expression Mode: Verbal Expression Assistive Devices:  (head nods) Expression: 7-Expresses complex ideas: With no assist  Social Interaction Social Interaction: 2-Interacts appropriately 25 - 49% of time - Needs frequent redirection.  Problem Solving Problem Solving: 2-Solves basic 25 -  49% of the time - needs direction more than half the time to initiate, plan or complete simple activities  Memory Memory Mode: Not assessed (unable to assess because of aphasia) Memory: 1-Recognizes or recalls less than 25% of the time/requires cueing greater than 75% of the time  Medical Problem List and Plan: 1. Functional deficits secondary to Large L-MCA infarct with dense right hemiplegia, dysphagia and global aphasia.   2.  DVT Prophylaxis/Anticoagulation: Pharmaceutical  Coumadin for DVT 3. Pain Management: tramadol, tylenol, appropriate turning in bed, regular checks for comfort 4. Mood: LCSW to follow for evaluation and support.   5. Neuropsych: This patient is not capable of making decisions on her own behalf. 6. Skin/Wound Care:  Turn patient every 4 hours.  Rehab RN to monitor skin daily.  Maintain adequate nutrition and hydration status.   7. Fluids/Electrolytes/Nutrition:  Monitor I/O.Off IVF   -recheck bmet this week  -encourage po, full supervision with feeding 8. Hypertension: normotensive at present 9.  Thyroid mass question carcinoma, further eval as pt recovers from severe CVA 10.  Hx bilateral knee OA, saw ortho as outpt, diclofenac gel 11.  Thrombocytopenia stable- bruising but no obvious hematoma formation 12.  ecoli UTI, sens to keflex --treated LOS (Days) 20 A FACE TO FACE EVALUATION WAS PERFORMED  SWARTZ,ZACHARY T 04/02/2014, 9:16 AM

## 2014-04-02 NOTE — Progress Notes (Signed)
Physical Therapy Session Note  Patient Details  Name: Sandy Barrett MRN: [id number] Date of Birth: 10-Jan-1932  Today's Date: 04/02/2014 PT Co-Treatment Time: 1030-1100 (Co-tx with KNP; entire session from 1000-1100) PT Co-Treatment Time Calculation (min): 30 min  Short Term Goals: Week 3:  PT Short Term Goal 1 (Week 3): STG's = LTG's secondary to anticipated LOS. Awaiting SNF placement.  Skilled Therapeutic Interventions/Progress Updates:    Skilled co-treatment with OT (KNP) focusing on visual attention to R hemibody, functional transfers, dynamic sitting balance. See OT note for further detail. Transported pt to gym in w.c with total A for energy conservation, In gym, pt performed squat pivot transfers from w/c <> mat table with +2A, manual stabilization of RLE secondary to knee extensor tone, max multimodal cueing for anterior weight shift due to pt tendency to push posteriorly. Seated EOM, pt engaged in ball tap activity with LUE with manual facilitation for functional LUE movement. Transitioned to receiving/tossing horseshoe with LUE with multimodal cueing for forearm pronation to elicit L finger extension to release horseshoes. CGA to mod A (to recovery from multiple posterior LOB) required for stability/balance with dynamic sitting activities. Ptmore engaged, participatory in automatic tasks, such as ball tap and horseshoes. Session ended in pt room, where pt was left reclined in tilt-in-space w/c with quick release belt in place for safety, daughter present, and all needs within reach.  Downgraded goals for dynamic sitting balance and w/c mobility secondary to limited pt progress.  Therapy Documentation Precautions:  Precautions Precautions: Fall Precaution Comments: [name] Restrictions Weight Bearing Restrictions: No Other Position/Activity Restrictions: R shoulder with sublux Vital Signs: Therapy Vitals Temp: 98.8 F (37.1 C) Temp Source: Oral Pulse  Rate: 91 Resp: 18 BP: (!) 119/50 mmHg Patient Position (if appropriate): Lying Oxygen Therapy SpO2: 96 % O2 Device: Not Delivered Pain:  No pain, per FACES scale.  See FIM for current functional status  Therapy/Group: Co-Treatment  Sacha Radloff A 04/02/2014, 7:17 PM

## 2014-04-02 NOTE — Progress Notes (Signed)
Occupational Therapy Session Note  Patient Details  Name: Sandy Barrett MRN: [id number] Date of Birth: Feb 18, 1932  Today's Date: 04/02/2014 OT Individual Time: 2197-5883 OT Individual Time Calculation (min): 60 min  and Today's Date: 04/02/2014 OT Co-Treatment Time: 1100-1130 (co-tx with SLP entire time 1100-1200) OT Co-Treatment Time Calculation (min): 30 min   Short Term Goals: Week 3:  OT Short Term Goal 1 (Week 3): Pt will initiate sit > stand with use of lift [facility]) for toilet transfer with mod verbal cues and mod assist OT Short Term Goal 2 (Week 3): Pt will initiate UB bathing with mod tactile and setup cues OT Short Term Goal 3 (Week 3): Pt will roll in bed with mod multimodal cues and max assist to assist caregiver with LB bathing  Skilled Therapeutic Interventions/Progress Updates:    1) Engaged in ADL retraining and therapeutic activity with focus on initiation and sustained attention to tasks.  Pt received up in w/c with daughter assisting with breakfast and reporting nursing already bathing and dressing pt.  Engaged in self-feeding task with pt demonstrating increased scanning to Rt and decreased spillage of food even without use of plate guard.  Pt with improved awareness of spillage on Rt side of mouth with spontaneous wiping of face.  Engaged in hand hygiene at sink with pt initiating reaching for spigot (without water running) but unable to turn on water despite verbal and gesture cues.  Therapeutic activity in sitting with use of flower puzzle with pt demonstrating increased initiation with picking up pieces and rotating them, but not able to fasten pieces together even when placed side by side by therapist.  Required hand over hand to fasten pieces together, except for 1 instance where pt completed without any cues.  Increased sustained attention and engagement in this task.  2) Engaged in skilled co-treat with primary SLP with focus on increased initiation and attention to  task.  Squat pivot transfer +2 to therapy mat.  Engaged in [place] dance activity with focus on increased interaction and participation in movements, with pt tapping hand and participating in movements with hand over hand to initiate followed by spontaneous actions.  Engaged in [place] task with SLP with focus on following directions with sorting activity based on color, modified task to include a familiar functional task.  Encouraged pt to make a cup of tea with selecting tea bag and type of sugar.  Pt continues to perseverate on bringing majority of items to mouth, requiring redirection when attempted to bring sugar packet to mouth.  Hand over hand to utilize Rt hand to stabilize sugar packet and then cup when opening and stirring.  Therapy Documentation Precautions:  Precautions Precautions: Fall Precaution Comments: [name] Restrictions Weight Bearing Restrictions: No Other Position/Activity Restrictions: R shoulder with sublux Pain:  Pt with no c/o pain  See FIM for current functional status  Therapy/Group: Individual Therapy and Co-Treatment  Navaeh Kehres, [facility] 04/02/2014, 12:51 PM

## 2014-04-02 NOTE — Progress Notes (Addendum)
Speech Language Pathology Daily Session Note  Patient Details  Name: Sandy Barrett MRN: [id number] Date of Birth: 22-Jan-1932  Today's Date: 04/02/2014 SLP Co-Treatment Time: 1130-1200 (1100-1200 co-tx with OT SPH)  SLP Co-Treatment Time Calculation (min): 30 min  Short Term Goals: Week 3: SLP Short Term Goal 1 (Week 3): Pt will improve accuracy of  yes/no responses to basic, biographical questions to >50% with mod-max assist gestural cues   SLP Short Term Goal 2 (Week 3): Pt will sustain attention to therapist or a basic, familiar task for 1-2 seconds with max assist verbal and tactile cues for redirection.   SLP Short Term Goal 3 (Week 3): Pt will complete verbal automatic sequences for 25-50% accuracy of approximations with max assist demonstration cues.   SLP Short Term Goal 4 (Week 3): Pt will attend to the right of the environment during basic, familiar tasks with mod assist verbal and tactile cues.   SLP Short Term Goal 5 (Week 3): Pt will follow 1 step commands during basic, familiar tasks for 50% accuracy with max assist demonstration cues.   SLP Short Term Goal 6 (Week 3): Pt will consume dys 1 textures and honey thick liquids with mod assist verbal and visual cues to monitor and correct anterior loss of materials and utilize rate and portion control over three targeted sessions prior to diet advancement.     Skilled Therapeutic Interventions:  Pt was seen for skilled ST/OT co-tx targeting cognitive-linguistic goals.  See OT note for specific goals and interventions.  SLP/OT facilitated the session with a musical task to facilitate participation in therapies and encourage spontaneity of verbal or motoric output.  While pt remained nonverbal throughout task, pt marked rhythm of various songs by tapping with hand over hand assist for initiation, faded to min-mod verbal cues to sustain repetitive, rhythmic movements.  SLP also facilitated the session with a sorting task of two items by  color targeting initiation and sustained attention during which the pt required hand over hand assist to monitor and correct perseveration.  Task was modified to a more functional context with pt able to make a cup of tea (i.e. Select sweetener, open packets, stir/steep tea bag) with hand over hand assist.  Pt consumed trials of honey thick tea via cup sips with anterior loss of over 1/2 of bolus.  Anterior loss was corrected with max assist multimodal cues for use of straw to facilitate improved labial seal, no overt s/s of aspiration. SLP will continue to follow up for trials of advanced consistencies to target diet progression.  Continue per current plan of care.    FIM:  Comprehension Comprehension Mode: Auditory Comprehension: 2-Understands basic 25 - 49% of the time/requires cueing 51 - 75% of the time Expression Expression Mode: Nonverbal Expression: 1-Expresses basis less than 25% of the time/requires cueing greater than 75% of the time. Social Interaction Social Interaction: 2-Interacts appropriately 25 - 49% of time - Needs frequent redirection. Problem Solving Problem Solving: 2-Solves basic 25 - 49% of the time - needs direction more than half the time to initiate, plan or complete simple activities Memory Memory: 1-Recognizes or recalls less than 25% of the time/requires cueing greater than 75% of the time FIM - Eating Eating Activity: 5: Needs verbal cues/supervision;4: Helper checks for pocketed food;4: Help with picking up utensils;4: Help with managing cup/glass  Pain Pain Assessment Pain Assessment: Faces Faces Pain Scale: No hurt  Therapy/Group: Individual Therapy  Renaye Janicki, Selinda Orion 04/02/2014, 2:38 PM

## 2014-04-02 NOTE — Progress Notes (Addendum)
ANTICOAGULATION CONSULT NOTE - Follow Up Consult  Pharmacy Consult for Coumadin Indication: DVT  No Known Allergies  Patient Measurements: Height: 5\' 5"  (165.1 cm) Weight: 170 lb 3.1 oz (77.2 kg) IBW/kg (Calculated) : 57 Heparin Dosing Weight:   Vital Signs: Temp: 98.3 F (36.8 C) (03/28 0546) Temp Source: Oral (03/28 0546) BP: 107/55 mmHg (03/28 0546) Pulse Rate: 86 (03/28 0546)  Labs:  Recent Labs  03/31/14 0845 04/01/14 0547 04/02/14 0934  HGB 10.6*  --   --   HCT 35.3*  --   --   PLT 618*  --   --   LABPROT 31.9* 33.4* 27.2*  INR 3.06* 3.25* 2.49*    Estimated Creatinine Clearance: 58.6 mL/min (by C-G formula based on Cr of 0.45).   Medications:  Scheduled:  . antiseptic oral rinse  7 mL Mouth Rinse BID  . chlorhexidine  15 mL Mouth Rinse BID  . diclofenac sodium  2 g Topical QID  . feeding supplement (ENSURE)  1 Container Oral TID BM  . iron polysaccharides  150 mg Oral BID AC  . pantoprazole sodium  40 mg Per Tube BID  . Warfarin - Pharmacist Dosing Inpatient   Does not apply q1800    Assessment: 81yo female with DVT, on Coumadin.  INR 2.49 this AM.  No CBC today, no bleeding noted.  Pt had been on Keflex for [facility] UTI, which was completed this AM  Goal of Therapy:  INR 2-3 Monitor platelets by anticoagulation protocol: Yes   Plan:  Coumadin 3mg   F/U in AM  Gracy Bruins, PharmD [facility]

## 2014-04-03 ENCOUNTER — Inpatient Hospital Stay (HOSPITAL_COMMUNITY): Payer: Medicare Other | Admitting: Speech Pathology

## 2014-04-03 ENCOUNTER — Inpatient Hospital Stay (HOSPITAL_COMMUNITY): Payer: Medicare Other | Admitting: Rehabilitation

## 2014-04-03 ENCOUNTER — Inpatient Hospital Stay (HOSPITAL_COMMUNITY): Payer: Medicare Other | Admitting: Occupational Therapy

## 2014-04-03 LAB — PROTIME-INR
INR: 2.37 — ABNORMAL HIGH (ref 0.00–1.49)
Prothrombin Time: 26.1 seconds — ABNORMAL HIGH (ref 11.6–15.2)

## 2014-04-03 LAB — CBC
HCT: 33 % — ABNORMAL LOW (ref 36.0–46.0)
Hemoglobin: 10.1 g/dL — ABNORMAL LOW (ref 12.0–15.0)
MCH: 29.4 pg (ref 26.0–34.0)
MCHC: 30.6 g/dL (ref 30.0–36.0)
MCV: 95.9 fL (ref 78.0–100.0)
Platelets: 693 10*3/uL — ABNORMAL HIGH (ref 150–400)
RBC: 3.44 MIL/uL — ABNORMAL LOW (ref 3.87–5.11)
RDW: 17.5 % — ABNORMAL HIGH (ref 11.5–15.5)
WBC: 7.1 10*3/uL (ref 4.0–10.5)

## 2014-04-03 MED ORDER — WARFARIN SODIUM 4 MG PO TABS
4.0000 mg | ORAL_TABLET | Freq: Once | ORAL | Status: AC
  Administered 2014-04-03: 4 mg via ORAL
  Filled 2014-04-03: qty 1

## 2014-04-03 NOTE — Progress Notes (Signed)
Orthopedic Tech Progress Note Patient Details:  Sandy Barrett [birth year] [id number] Brace order completed by [facility] vendor. Patient ID: Sandy Barrett, female   DOB: May 02, 1932, 81 y.o.   MRN: [id number]   Sandy Barrett 04/03/2014, 3:57 PM

## 2014-04-03 NOTE — Progress Notes (Signed)
Physical Therapy Session Note  Patient Details  Name: Sandy Barrett MRN: [id number] Date of Birth: May 29, 1932  Today's Date: 04/03/2014 PT Individual Time: 1400-1500 PT Individual Time Calculation (min): 60 min   Short Term Goals: Week 3:  PT Short Term Goal 1 (Week 3): STG's = LTG's secondary to anticipated LOS. Awaiting SNF placement.  Skilled Therapeutic Interventions/Progress Updates:   Pt received sitting in recliner in room.  Co-treat with RT to address functional transfers, sustained attention, dynamic sitting balance, forward weight shift, initiation of simple tasks, and following simple commands.  Daughter present during session to observe and answer questions about pt during session.  Assisted pt to/from therapy gym via recliner for time management.  Performed squat pivot transfer recliner<>therapy mat with +2 A for safety with pt assisting approx 15%.  Requires facilitation for forward trunk lean and weight shift, assist at R knee to prevent extensor tone, and assist to keep LUE from pushing to the R.  Once at [facility], performed forward reaching and sorting task of grasping silverware and placing into divider, grasping piece of silverware called out by therapist, attempting to identify piece of silverware and placing in appropriate section.  Pt requires HOH assist approx 75% of time to initiate task as well as place in appropriate section.  Provided facilitation for midline forward weight shift.  Progressed to attempting standing.  Upon standing with +2A, note that pts pants soiled with urine, therefore transferred back to recliner and assisted back to room.  Transferred to bed as above.  Performed rolling with mod/max A in order to doff soiled clothing.  Nurse tech in room to assist with remainder of peri care.  All needs in reach.   Therapy Documentation Precautions:  Precautions Precautions: Fall Precaution Comments: [name] Restrictions Weight Bearing  Restrictions: No Other Position/Activity Restrictions: R shoulder with sublux   Vital Signs: Therapy Vitals Temp: 99.2 F (37.3 C) Temp Source: Oral Pulse Rate: 79 Resp: 18 BP: (!) 103/56 mmHg Patient Position (if appropriate): Lying Oxygen Therapy SpO2: 97 % O2 Device: Not Delivered Pain: Pain Assessment Pain Assessment: Faces Faces Pain Scale: No hurt  See FIM for current functional status  Therapy/Group: Individual Therapy  Denice Bors 04/03/2014, 4:35 PM

## 2014-04-03 NOTE — Progress Notes (Signed)
Orthopedic Tech Progress Note Patient Details:  Sandy Barrett December 20, 1932 [id number]  Patient ID: Sandy Barrett, female   DOB: 02/03/1932, 81 y.o.   MRN: [id number] Called in advanced brace order; spoke with Evangeline Dakin, Karri Kallenbach 04/03/2014, 10:47 AM

## 2014-04-03 NOTE — Progress Notes (Signed)
Recreational Therapy Session Note  Patient Details  Name: Sandy Barrett MRN: [id number] Date of Birth: [birth year] Today's Date: 04/03/2014  Pain: no c/o, daughter stated previous request for pain meds, RN made aware and administered pain med prior to session Skilled Therapeutic Interventions/Progress Updates: Session focused on activity tolerance, scoot pivot transfers, dynamic sitting balance, forward weight shifts/leans, sit-stand, attention, problem solving and bed mobility.  Pt's daughter present & observing throughout session. Pt performed scoot pivot transfers with +2 assist, mod instructional cues for forward lean.  Pt sat EOM with min-mod assist for forward leans to retrieve silverware to place in appropriate location in Microbiologist.  Pt required mod-max multimodal cues to place silverware in organizer.  Pt required close supervision/min cues to consume honey thick cranberry juice seated EOM.  Attempted sit-stand with +2 assist and pts clothing noted to be wet.  Transferred back in the the recliner and transported back to the room and into bed for clean up.  Pt performed bed mobility with +2 assist.  NT in room to complete clean up at end of session. Therapy/Group: Co-Treatment Natalyn Szymanowski 04/03/2014, 4:26 PM

## 2014-04-03 NOTE — Progress Notes (Signed)
BLE dopplers revealed DVT R-distal CFV, FV, popliteal vein and DVT L-FV and popliteal vein.  2D echo revealed EF 65-70% with interatrial septal aneurysm.   Protein C, Protein S and lupus anticoagulant negative. Surgery consult recommended for work up once patient stable and question of thyroid malignancy.      Subjective/Complaints:   No distress. RN reported no problems ROS: cannot obtain due to aphasia Objective: Vital Signs: Blood pressure 112/52, pulse 88, temperature 98.1 F (36.7 C), temperature source Oral, resp. rate 18, height 5\' 5"  (1.651 m), weight 77.2 kg (170 lb 3.1 oz), SpO2 96 %. No results found. Results for orders placed or performed during the hospital encounter of 03/13/14 (from the past 72 hour(s))  Protime-INR     Status: Abnormal   Collection Time: 04/01/14  5:47 AM  Result Value Ref Range   Prothrombin Time 33.4 (H) 11.6 - 15.2 seconds   INR 3.25 (H) 0.00 - 1.49  Protime-INR     Status: Abnormal   Collection Time: 04/02/14  9:34 AM  Result Value Ref Range   Prothrombin Time 27.2 (H) 11.6 - 15.2 seconds   INR 2.49 (H) 0.00 - 1.49  Protime-INR     Status: Abnormal   Collection Time: 04/03/14  5:59 AM  Result Value Ref Range   Prothrombin Time 26.1 (H) 11.6 - 15.2 seconds   INR 2.37 (H) 0.00 - 1.49  CBC     Status: Abnormal   Collection Time: 04/03/14  5:59 AM  Result Value Ref Range   WBC 7.1 4.0 - 10.5 K/uL   RBC 3.44 (L) 3.87 - 5.11 MIL/uL   Hemoglobin 10.1 (L) 12.0 - 15.0 g/dL   HCT 33.0 (L) 36.0 - 46.0 %   MCV 95.9 78.0 - 100.0 fL   MCH 29.4 26.0 - 34.0 pg   MCHC 30.6 30.0 - 36.0 g/dL   RDW 17.5 (H) 11.5 - 15.5 %   Platelets 693 (H) 150 - 400 K/uL     HEENT: tongue midline,  neck vein prominence Cardio: RRR and no murmur Resp: CTA B/L and unlabored GI: BS positive and NT< ND Extremity:  Edema LUE> LLE Skin:   Bruise multiple ecchymotic areas BUE Neuro: alert,Unable to close eyes to command  Cranial Nerve Abnormalities RIght central 7,  Abnormal Sensory cannot assess due to aphasia, Abnormal Motor 0/5 RUE, 0/5  RLE, antigravity on Left  , Dysarthric, MAS 2 in R pec and biceps  Aphasic and Apraxic---repeats same phrase/word typically Musc/Skel:  No pain with RUE ROM Gen NAD,    Assessment/Plan: 1. Functional deficits secondary to Large left MCA infarct embolic which require 3+ hours per day of interdisciplinary therapy in a comprehensive inpatient rehab setting. Physiatrist is providing close team supervision and 24 hour management of active medical problems listed below. Physiatrist and rehab team continue to assess barriers to discharge/monitor patient progress toward functional and medical goals.  Ordered a right WHO to help maintain right hand/wrist position  FIM: FIM - Bathing Bathing Steps Patient Completed: Chest, Right lower leg (including foot), Left lower leg (including foot) Bathing: 2: Max-Patient completes 3-4 34f 10 parts or 25-49%  FIM - Upper Body Dressing/Undressing Upper body dressing/undressing steps patient completed: Thread/unthread left sleeve of pullover shirt/dress Upper body dressing/undressing: 2: Max-Patient completed 25-49% of tasks FIM - Lower Body Dressing/Undressing Lower body dressing/undressing: 1: Two helpers  FIM - Toileting Toileting: 1: Two helpers  FIM - Radio producer Devices: Recruitment consultant Transfers: 1-Two helpers  FIM - Control and instrumentation engineer Devices: Arm rests Bed/Chair Transfer: 1: Two helpers  FIM - Locomotion: Wheelchair Distance: 20 Locomotion: Wheelchair: 1: Total Assistance/staff pushes wheelchair (Pt<25%) FIM - Locomotion: Ambulation Locomotion: Ambulation: 0: Activity did not occur  Comprehension Comprehension Mode: Auditory Comprehension: 2-Understands basic 25 - 49% of the time/requires cueing 51 - 75% of the time  Expression Expression Mode: Nonverbal Expression Assistive Devices:  (head  nods) Expression: 1-Expresses basis less than 25% of the time/requires cueing greater than 75% of the time.  Social Interaction Social Interaction: 2-Interacts appropriately 25 - 49% of time - Needs frequent redirection.  Problem Solving Problem Solving: 2-Solves basic 25 - 49% of the time - needs direction more than half the time to initiate, plan or complete simple activities  Memory Memory Mode: Not assessed (unable to assess because of aphasia) Memory: 1-Recognizes or recalls less than 25% of the time/requires cueing greater than 75% of the time  Medical Problem List and Plan: 1. Functional deficits secondary to Large L-MCA infarct with dense right hemiplegia, dysphagia and global aphasia.   2.  DVT Prophylaxis/Anticoagulation: Pharmaceutical  Coumadin for DVT 3. Pain Management: tramadol, tylenol, appropriate turning in bed, regular checks for comfort 4. Mood: LCSW to follow for evaluation and support.   5. Neuropsych: This patient is not capable of making decisions on her own behalf. 6. Skin/Wound Care:  Turn patient every 4 hours.  Rehab RN to monitor skin daily.  Maintain adequate nutrition and hydration status.   7. Fluids/Electrolytes/Nutrition:  Monitor I/O.Off IVF   -recheck bmet tomorrow  -encourage po, full supervision with feeding 8. Hypertension: normotensive at present 9.  Thyroid mass question carcinoma, further eval as pt recovers from severe CVA 10.  Hx bilateral knee OA, saw ortho as outpt, diclofenac gel 11.  Thrombocytopenia stable- bruising but no obvious hematoma formation 12.  ecoli UTI, sens to keflex --treated LOS (Days) 21 A FACE TO FACE EVALUATION WAS PERFORMED  Sandy Barrett T 04/03/2014, 9:24 AM

## 2014-04-03 NOTE — Progress Notes (Signed)
Speech Language Pathology Daily Session Note  Patient Details  Name: Sandy Barrett MRN: [id number] Date of Birth: 1932-03-17  Today's Date: 04/03/2014 SLP Individual Time: 0900-1000 SLP Individual Time Calculation (min): 60 min  Short Term Goals: Week 3: SLP Short Term Goal 1 (Week 3): Pt will improve accuracy of  yes/no responses to basic, biographical questions to >50% with mod-max assist gestural cues   SLP Short Term Goal 2 (Week 3): Pt will sustain attention to therapist or a basic, familiar task for 1-2 seconds with max assist verbal and tactile cues for redirection.   SLP Short Term Goal 3 (Week 3): Pt will complete verbal automatic sequences for 25-50% accuracy of approximations with max assist demonstration cues.   SLP Short Term Goal 4 (Week 3): Pt will attend to the right of the environment during basic, familiar tasks with mod assist verbal and tactile cues.   SLP Short Term Goal 5 (Week 3): Pt will follow 1 step commands during basic, familiar tasks for 50% accuracy with max assist demonstration cues.   SLP Short Term Goal 6 (Week 3): Pt will consume dys 1 textures and honey thick liquids with mod assist verbal and visual cues to monitor and correct anterior loss of materials and utilize rate and portion control over three targeted sessions prior to diet advancement.     Skilled Therapeutic Interventions:  Pt was seen for skilled ST targeting goals for dysphagia and cogntion.   Upon arrival, pt was seated upright in wheelchair, awake, alert, and agreeable to participate in therapy as indicated via head nods and smiles in response to SLP.  Pt consumed trials of ice chips and thin liquids via teaspoons to continue working towards liquids advancement.  Pt was noted with improved timeliness of swallow initiation with teaspoons of thin liquids; however, she was noted with oral holding of ice chips, for which she required mod verbal and visual cues to correct.  No overt s/s of aspiration  were noted with thin liquids or ice chips.  Pt may benefit from repeat objective swallow study to determine readiness for liquids advancement given pt's high risk of silent aspiration in the setting of significant neuro deficits.  SLP then initiated a basic kitchen ADL (making pudding) targeting sustained attention and initiation.  Pt tore open package with hand over hand assist faded to demonstration cues and then poured contents of package into a bowel with max verbal and visual cues.  Pt required max tactile, verbal, and demonstration cues to sustain attention to mixing water and pudding mix together for 90 seconds.  Pt placed utensils and bowels into the dishwasher with mod verbal and visual cues and threw trash into the garbage bin with max demonstration cues.  Continue per current plan of care.    FIM:  Comprehension Comprehension Mode: Auditory Comprehension: 2-Understands basic 25 - 49% of the time/requires cueing 51 - 75% of the time Expression Expression Mode: Nonverbal Expression: 1-Expresses basis less than 25% of the time/requires cueing greater than 75% of the time. Social Interaction Social Interaction: 2-Interacts appropriately 25 - 49% of time - Needs frequent redirection. Problem Solving Problem Solving: 2-Solves basic 25 - 49% of the time - needs direction more than half the time to initiate, plan or complete simple activities Memory Memory: 1-Recognizes or recalls less than 25% of the time/requires cueing greater than 75% of the time FIM - Eating Eating Activity: 4: Help with managing cup/glass;4: Helper occasionally scoops food on utensil;5: Needs verbal cues/supervision  Pain Pain  Assessment Pain Assessment: Faces Faces Pain Scale: No hurt  Therapy/Group: Individual Therapy  Lynae Pederson, Selinda Orion 04/03/2014, 12:55 PM

## 2014-04-03 NOTE — Progress Notes (Signed)
Occupational Therapy Session Note  Patient Details  Name: Sandy Barrett MRN: [id number] Date of Birth: 11/21/1932  Today's Date: 04/03/2014 OT Individual Time: 7510-2585 OT Individual Time Calculation (min): 60 min    Short Term Goals: Week 3:  OT Short Term Goal 1 (Week 3): Pt will initiate sit > stand with use of lift [facility]) for toilet transfer with mod verbal cues and mod assist OT Short Term Goal 2 (Week 3): Pt will initiate UB bathing with mod tactile and setup cues OT Short Term Goal 3 (Week 3): Pt will roll in bed with mod multimodal cues and max assist to assist caregiver with LB bathing  Skilled Therapeutic Interventions/Progress Updates:    Engaged in ADL retraining with focus on increased initiation and sustained attention while decreasing burden of care.  Pt in bed upon arrival, engaged in perineal hygiene and LB dressing at bed level with 1 person assist for rolling, hygiene, and clothing management.  Pt with carryover of hand placement with rolling and able to roll to Rt with min-mod assist and to Lt with total assist secondary to Rt hemiplegia.  Pt in continent of bowel prior to session, therefore engaged in rolling side to side multiple times to complete hygiene.  Pt demonstrating increased initiation with LB dressing with lifting LLE and then reaching to pull pants over knees.  Therapist completed LB dressing at bed level with rolling Rt and Lt.  UB bathing and dressing completed while seated EOB with pt demonstrating increased initiation and sequencing with UB bathing with scanning to RUE to wash hand and forearm without additional cue.  Pt attempted to don shirt over head first, requiring assist to bring it over head then pt donned LUE.  When questioned about RUE, pt appeared unaware that it had not been dressed.  Squat pivot bed > w/c with +2 with pt demonstrating increased initiation with reaching for w/c.  Seated at sink, pt washed face with setup assist and then applied  lipstick with mod cues to redirect from putting in mouth, with pt about 50% accurate with putting it on Lt side of lips.  Ideational apraxia with attempting to place lipstick cap on therapist's finger.  Pt attempted to place comb on Rt side, as if to use RUE to comb hair, but when unable shook her head and passed comb back to therapist.  Therapy Documentation Precautions:  Precautions Precautions: Fall Precaution Comments: [name] Restrictions Weight Bearing Restrictions: No Other Position/Activity Restrictions: R shoulder with sublux Pain:  Pt with no c/o pain  See FIM for current functional status  Therapy/Group: Individual Therapy  Simonne Come 04/03/2014, 10:17 AM

## 2014-04-03 NOTE — Progress Notes (Signed)
ANTICOAGULATION CONSULT NOTE - Follow Up Consult  Pharmacy Consult for Coumadin Indication: pulmonary embolus and DVT  No Known Allergies  Patient Measurements: Height: 5\' 5"  (165.1 cm) Weight: 170 lb 3.1 oz (77.2 kg) IBW/kg (Calculated) : 57 Heparin Dosing Weight:   Vital Signs: Temp: 99.2 F (37.3 C) (03/29 1511) Temp Source: Oral (03/29 1511) BP: 103/56 mmHg (03/29 1511) Pulse Rate: 79 (03/29 1511)  Labs:  Recent Labs  04/01/14 0547 04/02/14 0934 04/03/14 0559  HGB  --   --  10.1*  HCT  --   --  33.0*  PLT  --   --  693*  LABPROT 33.4* 27.2* 26.1*  INR 3.25* 2.49* 2.37*    Estimated Creatinine Clearance: 58.6 mL/min (by C-G formula based on Cr of 0.45).   Medications:  Scheduled:  . antiseptic oral rinse  7 mL Mouth Rinse BID  . chlorhexidine  15 mL Mouth Rinse BID  . diclofenac sodium  2 g Topical QID  . feeding supplement (ENSURE)  1 Container Oral TID BM  . iron polysaccharides  150 mg Oral BID AC  . pantoprazole sodium  40 mg Per Tube BID  . Warfarin - Pharmacist Dosing Inpatient   Does not apply q1800    Assessment: 81yo female with DVT and PE.  INR 2.37 this AM- down again after order for dose was not entered.  No CBC, no bleeding noted.   Goal of Therapy:  INR 2-3 Monitor platelets by anticoagulation protocol: Yes   Plan:  Coumadin 4mg  today Daily INR  Gracy Bruins, PharmD [facility]

## 2014-04-04 ENCOUNTER — Inpatient Hospital Stay (HOSPITAL_COMMUNITY): Payer: Medicare Other | Admitting: Occupational Therapy

## 2014-04-04 ENCOUNTER — Encounter (HOSPITAL_COMMUNITY): Payer: Medicare Other | Admitting: Speech Pathology

## 2014-04-04 ENCOUNTER — Inpatient Hospital Stay (HOSPITAL_COMMUNITY): Payer: Medicare Other | Admitting: Physical Therapy

## 2014-04-04 ENCOUNTER — Inpatient Hospital Stay (HOSPITAL_COMMUNITY): Payer: Medicare Other

## 2014-04-04 LAB — BASIC METABOLIC PANEL
Anion gap: 7 (ref 5–15)
BUN: 10 mg/dL (ref 6–23)
CO2: 28 mmol/L (ref 19–32)
Calcium: 9.4 mg/dL (ref 8.4–10.5)
Chloride: 106 mmol/L (ref 96–112)
Creatinine, Ser: 0.49 mg/dL — ABNORMAL LOW (ref 0.50–1.10)
GFR calc Af Amer: >90 mL/min (ref >90)
GFR calc non Af Amer: >90 mL/min (ref >90)
Glucose, Bld: 93 mg/dL (ref 70–99)
Potassium: 4.2 mmol/L (ref 3.5–5.1)
Sodium: 141 mmol/L (ref 135–145)

## 2014-04-04 LAB — PROTIME-INR
INR: 1.99 — ABNORMAL HIGH (ref 0.00–1.49)
Prothrombin Time: 22.8 seconds — ABNORMAL HIGH (ref 11.6–15.2)

## 2014-04-04 MED ORDER — WARFARIN SODIUM 4 MG PO TABS
4.0000 mg | ORAL_TABLET | Freq: Once | ORAL | Status: AC
  Administered 2014-04-04: 4 mg via ORAL
  Filled 2014-04-04: qty 1

## 2014-04-04 NOTE — Progress Notes (Signed)
Occupational Therapy Session Note  Patient Details  Name: Sandy Barrett MRN: [id number] Date of Birth: 08/07/1932  Today's Date: 04/04/2014 OT Individual Time: 1425-1506 OT Individual Time Calculation (min): 41 min    Skilled Therapeutic Interventions/Progress Updates:    Pt supine in bed as therapist entered.  She was able to visually attend to therapist on the right side as he talked to her.  Began session by working on donning pants in supine position.  Therapist assisted with donning pants over feet, with pt lifting her LLE to command to place in the pants.  She also reached down and attempted to pull them up from her knees with the LUE.  Rolling to the left side with total assist to pull up over hips and to the right with mod assist and mod demonstrational cueing.  Pt transitioned to sitting EOB with max from right sidelying.  She was able to maintain sitting with close supervision.  Worked on simple grooming tasks such as combing her hair, and applying lipstick.  When given the comb she was able to use it some with the LUE but with decreased thoroughness.  Provided total hand over hand assist to integrate the RUE into the task as well.  Pt needed mod facilitation for applying lipstick correctly.  Therapist gave min assist for her to apply lotion to her face in order to initiate it.  When given glasses she was able to apply them with supervision.  Finished session with transfer squat pivot with total assist +2 (pt 30%).   Pt left in tilt in space wheelchair, with safety belt and sling on the RUE for support.  Nursing made aware and soft call bell within reach.    Therapy Documentation Precautions:  Precautions Precautions: Fall Precaution Comments: [name] Restrictions Weight Bearing Restrictions: No Other Position/Activity Restrictions: R shoulder with sublux  Pain: Pain Assessment Pain Assessment: No/denies pain ADL: See FIM for current functional  status  Therapy/Group: Individual Therapy  Kyaira Trantham OTR/L 04/04/2014, 3:50 PM

## 2014-04-04 NOTE — Progress Notes (Signed)
Occupational Therapy Session Note  Patient Details  Name: Sandy Barrett MRN: [id number] Date of Birth: 05-21-1932  Today's Date: 04/04/2014 OT Individual Time: 9678-9381 OT Individual Time Calculation (min): 60 min    Short Term Goals: Week 3:  OT Short Term Goal 1 (Week 3): Pt will initiate sit > stand with use of lift [facility]) for toilet transfer with mod verbal cues and mod assist OT Short Term Goal 2 (Week 3): Pt will initiate UB bathing with mod tactile and setup cues OT Short Term Goal 3 (Week 3): Pt will roll in bed with mod multimodal cues and max assist to assist caregiver with LB bathing  Skilled Therapeutic Interventions/Progress Updates:    Engaged in ADL retraining with focus on increased initiation, sustained attention to task, and scanning to RUE during self-care tasks.  Pt in bed upon arrival, engaged in perineal hygiene and LB dressing at bed level with 1 person assist for rolling, hygiene, and clothing management.  Pt with increased initiation with rolling and LB dressing with reaching for bed rails to assist with rolling and pulling pants over knees.  UB bathing and dressing completed sitting at EOB with pt demonstrating increased initiation and sequencing with UB bathing with scanning to wash RUE with setup assist of placing wash cloth and positioning RUE to allow pt to get to underarm.  Engaged in hemi-dressing with UB with pt able to thread LUE after therapist thread RUE, requiring assist to pull shirt over head.  Squat pivot bed > w/c with +2 with pt demonstrating increased initiation with reaching for w/c, participating approx 10-15% with transfer.  Unable to carryover applying lipstick this session as from last session, with pt attempting to put it under her Lt eye and then in her mouth.    Therapy Documentation Precautions:  Precautions Precautions: Fall Precaution Comments: [name] Restrictions Weight Bearing Restrictions: No Other  Position/Activity Restrictions: R shoulder with sublux General:   Vital Signs: Therapy Vitals Temp: 98.3 F (36.8 C) Temp Source: Oral Pulse Rate: 87 Resp: 18 BP: 122/81 mmHg Patient Position (if appropriate): Sitting Oxygen Therapy SpO2: 96 % O2 Device: Not Delivered Pain:  Pt with no c/o pain  See FIM for current functional status  Therapy/Group: Individual Therapy  Simonne Come 04/04/2014, 9:24 AM

## 2014-04-04 NOTE — Progress Notes (Signed)
Social Work Patient ID: Sandy Barrett, female   DOB: 10/22/1932, 81 y.o.   MRN: [id number] Spoke with [facility] they will have a bed for pt tomorrow.  They will contact daughter to arrange time to do paperwork and work on transfer tomorrow. Pt and daughter's are pleased with this plan are agreeable to the plan.

## 2014-04-04 NOTE — Plan of Care (Signed)
Problem: RH Attention Goal: LTG Patient will demonstrate focused/sustained (PT) LTG: Patient will demonstrate focused/sustained/selective/alternating/divided attention during functional mobility in specific environment with assist for # of minutes (PT)  Outcome: Not Applicable Date Met:  16/38/45 Not met due to pt currently requiring max cueing to sustain attention to task for 90 consecutive seconds.

## 2014-04-04 NOTE — Discharge Summary (Signed)
Physician Discharge Summary  Patient ID: Sandy Barrett MRN: [id number] DOB/AGE: 11-Feb-1932 81 y.o.  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  Brief HPI:   Sandy Barrett is a 81 y.o. female with history of HTN, OA, Migraines, hyperlipidemia who was admitted on 03/08/14 with right sided weakness, difficulty talking and right facial droop. CT head showed dense left MCA sign and Perfusion study showed a large area of penumbra involving the left MCA territory and  tPA administered. However incidental note of large thyroid mass displacing the trachea to the right, left internal jugular thormbus likely due to extrinsic compression by goiter, question right jugular thrombus and bilateral pulmonary emboli. Patient underwent cerebral angiogram with unsuccessful attempts at revascularization due to technical difficulties. MRI brain done revealing patchy areas of acute infarct L-MCA territory and severe punctate foci in R-MCA territory with possible trace SAH in high right frontal lobe. MRA brain with occluded L-ICA and L-MCA.   Carotid dopplers showed sluggish flow right jugular vein  and left jugular vein thrombus--subacute and non-occlusive and suggestion of distal Left ICA occlusion. BLE dopplers revealed DVT R-distal CFV, FV, popliteal vein and DVT L-FV and popliteal vein. Patient started on IV heparin for treatment and Dr. Leonie Man recommended starting oral anticoagulant in a week for stroke due paradoxical emboli.   Patient with resultant, right sided weakness, right visual deficits with left gaze preference, severe global aphasia and ideation apraxia. CIR was recommended by MD and Rehab team for follow up therapy.    Hospital Course: Sandy Barrett was admitted to rehab 03/13/2014 for inpatient therapies to consist of PT, ST and OT at least three hours five days a week. Past admission physiatrist, therapy team and rehab RN have worked together to provide customized collaborative inpatient rehab. Blood pressure were monitored on tid basis and have been stable. Follow up CT head was done on 3/10 showing evolving L-MCA infarct with trace SAH and Dr. Leonie Man recommended coumadin with heparin bridge. H/H has been monitored and is stable at 10.5. INR is therapeutic at 2.13.  Liquids were downgraded to pudding consistency due to significant apraxia withwet voice quality and delay in swallow.Dsphagia treatment has been ongoing by speech therapy and repeat MBS was done on 03/30 showing improvement in swallow function. Diet has been advanced to dysphagia 1, nectar liquids by cup. Chronic knee pain due to OA has been managed with use of voltaren gel qid as well as tramadol on prn basis.   Po intake has been good and renal status has been stable. She does  requires assistance with meals to help maintain adequate intake. Mood has been stable and patient has shown good motivation in therapy sessions. She continues to be limited by apraxia, right inattention, anxiety with standing attempts as well as dense right hemiparesis. She is incontinent of bowel and bladder and requires scheduled toileting as is  unable to express basic needs.  She currently requires max to total assist and family has elected on futher therapy at SNF. Bed is available at [facility] on 03/31 and patient was discharged in improved condition.     Rehab course: During patient's stay in rehab weekly team conferences were held to monitor patient's progress, set goals and discuss barriers to discharge. At admission, patient required total assist with basic self care task as well as mobility. She had severe global aphasia with inability to follow simple one step commands or convey wants/needs. She has had improvement in activity tolerance, balance, postural control, as well as ability to compensate for deficits. She requires total hand over hand assist to integrate RUE in basic grooming tasks, max assist for bathing, total assist for LB dressing and total assist with all standing tasks. She continues to require +2 total assist (patient < 25%) with transfers.  She requires moderate assistance with bed mobility and is able to sit at EOB with close supervision. She is able to express basic needs with max cues and is able to understand basic commands with moderate to max cues. She needs redirection to complete simple tasks.     Disposition: Skilled Nursing Facility  Diet: Dysphagia 1, nectar liquids  Special Instructions: 1. Full supervision for meals. Slow rate. Small bites/sips and follow solids with liquids. Needs to be upright for meals. Check for pocketing.  2.  Toilet patient every 4 hours while awake.      Medication List    STOP taking these medications        butalbital-acetaminophen-caffeine 50-325-40 MG per tablet  Commonly known as:  FIORICET, ESGIC     hydrochlorothiazide 25 MG tablet  Commonly known as:  HYDRODIURIL     meloxicam 7.5 MG tablet  Commonly known as:  MOBIC     polyethylene glycol powder powder  Commonly known as:  GLYCOLAX/MIRALAX     propranolol 20 MG tablet  Commonly known as:  INDERAL       TAKE these medications        acetaminophen 325 MG tablet  Commonly known as:  TYLENOL  Take 1-2 tablets (325-650 mg total) by mouth every 4 (four) hours as needed for mild pain.     ALIGN 4 MG Caps  Take by mouth daily.     antiseptic oral rinse 0.05 % Liqd solution  Commonly known as:  CPC / CETYLPYRIDINIUM CHLORIDE 0.05%  7 mLs by Mouth Rinse route 2 (two) times daily.     diclofenac sodium 1 % Gel  Commonly known as:  VOLTAREN  Apply 2 g topically 3 (three) times daily. To affected joint.     esomeprazole 40 MG capsule  Commonly known as:  NEXIUM  Take 1 capsule (40 mg total) by mouth daily.     iron polysaccharides 150 MG capsule  Commonly known as:  NIFEREX  Take 1 capsule (150 mg total) by mouth 2 (two) times daily before lunch and supper.     traMADol 50 MG tablet  Commonly known as:  ULTRAM  Take 1 tablet (50 mg total) by mouth 4 (four) times daily as needed for moderate pain.  traZODone 50 MG tablet  Commonly known as:  DESYREL  Take 1 tablet (50 mg total) by mouth at bedtime as needed for sleep.     warfarin 3 MG tablet  Commonly known as:  COUMADIN  Take 1 tablet (3 mg total) by mouth daily at 6 PM.       Follow-up Information    Follow up with Charlett Blake, MD On 05/04/2014.   Specialty:  Physical Medicine and Rehabilitation   Why:  Be there at 9:45 am   for  10 am   appointment    Contact information:   [place] [address] [phone]       Follow up with Antony Contras, MD. Call today.   Specialties:  Neurology, Radiology   Why:  for follow up in 4 weeks.    Contact information:   [address] [place] [address] [phone]       Follow up with Earnstine Regal, MD On 04/25/2014.   Specialty:  General Surgery   Why:  Be there at 8:30 am for evaluation of thyroid mass.   Contact information:   [address] [place] [address] [phone]       Signed: [name] 04/05/2014, 9:55 AM

## 2014-04-04 NOTE — Progress Notes (Signed)
Physical Therapy Discharge Summary  Patient Details  Name: Sandy Barrett MRN: [id number] Date of Birth: July 11, 1932  Today's Date: 04/05/2014 PT Individual Time: 1100-1200 PT Individual Time Calculation (min): 60 min    Patient has met 3 of 6 long term goals due to improved activity tolerance, improved balance and improved postural control.  Patient to discharge at a wheelchair level Total Assist.   Patient's care partner unavailable to provide the necessary physical and cognitive assistance at discharge; therefore, physical and cognitive assistance will be provided at next venue of care.  Reasons goals not met: Goal addressing basic transfers not met secondary ot patient requiring Total A of single therapist for bed > w/c transfer on D/C evaluation; long term goal for sustained attention not met secondary to pt requiring max cues to sustained attention for 90 seconds; and goal addressing awareness not met due to pt requiring total A for intellectual awareness.  Recommendation:  Patient will benefit from ongoing skilled PT services in skilled nursing facility setting to continue to advance safe functional mobility, address ongoing impairments in R hemiplegia, inattention to R hemibody, and to minimize fall risk.  Equipment: No equipment provided Equipment to be provided at next venue of care.  Reasons for discharge: discharge from hospital  Patient/family agrees with progress made and goals achieved: Yes   Skilled Therapeutic Interventions/Progress Updates Discharge evaluation completed; see below for detailed findings. Educated pt and daughter on goals, progress, and discharge plan. Daughter verbalized understanding and was in full agreement with discharge plan.   PT Discharge Precautions/Restrictions Precautions Precautions: Fall Precaution Comments: Lt gaze preference Restrictions Weight Bearing Restrictions: No Pain Pain Assessment Pain Assessment: Faces Faces Pain Scale: No  hurt Vision/Perception    Inattention to R visual field; inattention to R hemibody Cognition Overall Cognitive Status: Impaired/Different from baseline Arousal/Alertness: Awake/alert Orientation Level: Other (comment) (difficult to assess due to global aphasia and apraxia ) Attention: Sustained Sustained Attention: Impaired Sustained Attention Impairment: Functional basic;Verbal basic Memory: Impaired Memory Impairment: Storage deficit Awareness: Impaired Awareness Impairment: Intellectual impairment Problem Solving: Impaired Problem Solving Impairment: Functional basic;Verbal basic Behaviors: Perseveration Safety/Judgment: Impaired Comments: right inattention, perseveration, decreased awareness of verbal errors.   Sensation Sensation Light Touch: Impaired Detail Light Touch Impaired Details: Impaired RLE;Impaired RUE Proprioception: Impaired Detail Proprioception Impaired Details: Impaired RUE;Impaired RLE Coordination Gross Motor Movements are Fluid and Coordinated: No Fine Motor Movements are Fluid and Coordinated: No Heel Shin Test: Unable to assess secondary to significant weakness in RLE. Motor  Motor Motor: Hemiplegia;Abnormal postural alignment and control;Motor apraxia Motor - Discharge Observations: R hemiplegia  Mobility Bed Mobility Bed Mobility: Supine to Sit;Sit to Supine Supine to Sit: HOB elevated;With rails;2: Max assist Supine to Sit Details: Tactile cues for sequencing;Tactile cues for placement;Verbal cues for sequencing;Verbal cues for technique Supine to Sit Details (indicate cue type and reason): Pt did initiate L knee fleixon but required verbal/tactile cueing to move LUE across midline, place L hand on bed rail, and transition from supine > R side lying Sit to Supine: 2: Max assist;HOB flat Transfers Transfers: Yes Sit to Stand: 1: +1 Total assist;From chair/3-in-1;Other/comment (using bariatric Stedy; assist of second person for management of  Stedy) Sit to Stand Details: Tactile cues for placement;Manual facilitation for weight shifting (using bariatric Stedy) Stand to Sit: Other (comment);1: +1 Total assist;To chair/3-in-1;1: +2 Total assist (using bariatric Stedy; assist of second person for management of Stedy) Stand to Sit Details (indicate cue type and reason): Manual facilitation for weight shifting;Tactile cues for placement  Squat Pivot Transfers: 1: +1 Total assist;With armrests Squat Pivot Transfer Details: Tactile cues for initiation;Tactile cues for sequencing;Tactile cues for placement;Verbal cues for technique;Verbal cues for sequencing Locomotion  Ambulation Ambulation: No (Not assessed due to safety concerns) Gait Gait: No (Not assessed due to safety concerns) Stairs / Additional Locomotion Stairs: No (Not assessed due to safety concerns) Wheelchair Mobility Wheelchair Mobility: Yes Wheelchair Assistance: 3: Mod assist Wheelchair Assistance Details: Tactile cues for sequencing;Tactile cues for placement;Tactile cues for weight beaing;Tactile cues for initiation Wheelchair Propulsion: Left lower extremity Wheelchair Parts Management: Needs assistance Distance: 25  Trunk/Postural Assessment  Cervical Assessment Cervical Assessment: Exceptions to [facility] (lower cervical flexion; upper cervical extension) Thoracic Assessment Thoracic Assessment: Exceptions to [facility] (thoracic kyphosis) Lumbar Assessment Lumbar Assessment: Exceptions to [facility] (posterior pelvic tilt) Postural Control Postural Control: Deficits on evaluation Head Control: Pt able to maintain head upright Trunk Control: Seated EOB with bilat LE's supported and LUE support at bed rail, pt with slight lateral trunk lean to R side. Postural Limitations: In seated, pt demonstrates weightbearing preference on L side of pelvis.  Balance Balance Balance Assessed: Yes Static Sitting Balance Static Sitting - Balance Support: Left upper extremity supported;Feet  supported Static Sitting - Level of Assistance: 5: Stand by assistance Dynamic Sitting Balance Dynamic Sitting - Level of Assistance: 4: Min assist Extremity Assessment   RLE Assessment RLE Assessment: Exceptions to [facility] RLE Strength RLE Overall Strength: Unable to assess;Deficits;Other (Comment) RLE Overall Strength Comments: Unable to formally assess secondary to aphasia and decreased attention to R hemibody.  LLE Assessment LLE Assessment: Exceptions to [facility] LLE Strength LLE Overall Strength: Deficits LLE Overall Strength Comments: Difficult to formally assess secondary to global aphasia. Based on observation of functional movement, grossly 3+/5 to 4-/5.  See FIM for current functional status  [name], [name] 04/05/2014, 12:40 PM

## 2014-04-04 NOTE — Progress Notes (Signed)
Speech Language Pathology Note  Patient Details  Name: Sandy Barrett MRN: [id number] Date of Birth: [birth year] Today's Date: 04/04/2014  MBSS complete. Full report located under chart review in imaging section.    Mariene Dickerman, Selinda Orion 04/04/2014, 7:50 PM

## 2014-04-04 NOTE — Progress Notes (Signed)
ANTICOAGULATION CONSULT NOTE - Follow Up Consult  Pharmacy Consult for Coumadin Indication: pulmonary embolus, DVT and stroke  No Known Allergies  Patient Measurements: Height: 5\' 5"  (165.1 cm) Weight: 170 lb 3.1 oz (77.2 kg) IBW/kg (Calculated) : 57 Heparin Dosing Weight:   Vital Signs: Temp: 98.3 F (36.8 C) (03/30 0550) Temp Source: Oral (03/30 0550) BP: 122/81 mmHg (03/30 0805) Pulse Rate: 87 (03/30 0805)  Labs:  Recent Labs  04/02/14 0934 04/03/14 0559 04/04/14 0437 04/04/14 1059  HGB  --  10.1*  --   --   HCT  --  33.0*  --   --   PLT  --  693*  --   --   LABPROT 27.2* 26.1* 22.8*  --   INR 2.49* 2.37* 1.99*  --   CREATININE  --   --   --  0.49*    Estimated Creatinine Clearance: 58.6 mL/min (by C-G formula based on Cr of 0.49).   Medications:  Scheduled:  . antiseptic oral rinse  7 mL Mouth Rinse BID  . chlorhexidine  15 mL Mouth Rinse BID  . diclofenac sodium  2 g Topical QID  . feeding supplement (ENSURE)  1 Container Oral TID BM  . iron polysaccharides  150 mg Oral BID AC  . pantoprazole sodium  40 mg Per Tube BID  . Warfarin - Pharmacist Dosing Inpatient   Does not apply q1800    Assessment: 81yo female with DVT/PE and stroke.  INR 1.99 this AM- down after no dose on 3/28.  No CBC and no bleeding noted.  Will repeat higher Coumadin dose today, then expect need to lower dose.  If INR doesn't correct, may need to resume Lovenox.  Goal of Therapy:  INR 2-3 Monitor platelets by anticoagulation protocol: Yes   Plan:  Coumadin 4mg  Daily INR  Gracy Bruins, PharmD Clinical Pharmacist [facility]

## 2014-04-04 NOTE — Progress Notes (Signed)
BLE dopplers revealed DVT R-distal CFV, FV, popliteal vein and DVT L-FV and popliteal vein.  2D echo revealed EF 65-70% with interatrial septal aneurysm.   Protein C, Protein S and lupus anticoagulant negative. Surgery consult recommended for work up once patient stable and question of thyroid malignancy.      Subjective/Complaints:   Awake sitting in bed. No distress. ROS: cannot obtain due to aphasia Objective: Vital Signs: Blood pressure 122/81, pulse 87, temperature 98.3 F (36.8 C), temperature source Oral, resp. rate 18, height 5\' 5"  (1.651 m), weight 77.2 kg (170 lb 3.1 oz), SpO2 96 %. No results found. Results for orders placed or performed during the hospital encounter of 03/13/14 (from the past 72 hour(s))  Protime-INR     Status: Abnormal   Collection Time: 04/02/14  9:34 AM  Result Value Ref Range   Prothrombin Time 27.2 (H) 11.6 - 15.2 seconds   INR 2.49 (H) 0.00 - 1.49  Protime-INR     Status: Abnormal   Collection Time: 04/03/14  5:59 AM  Result Value Ref Range   Prothrombin Time 26.1 (H) 11.6 - 15.2 seconds   INR 2.37 (H) 0.00 - 1.49  CBC     Status: Abnormal   Collection Time: 04/03/14  5:59 AM  Result Value Ref Range   WBC 7.1 4.0 - 10.5 K/uL   RBC 3.44 (L) 3.87 - 5.11 MIL/uL   Hemoglobin 10.1 (L) 12.0 - 15.0 g/dL   HCT 33.0 (L) 36.0 - 46.0 %   MCV 95.9 78.0 - 100.0 fL   MCH 29.4 26.0 - 34.0 pg   MCHC 30.6 30.0 - 36.0 g/dL   RDW 17.5 (H) 11.5 - 15.5 %   Platelets 693 (H) 150 - 400 K/uL  Protime-INR     Status: Abnormal   Collection Time: 04/04/14  4:37 AM  Result Value Ref Range   Prothrombin Time 22.8 (H) 11.6 - 15.2 seconds   INR 1.99 (H) 0.00 - 1.49     HEENT: tongue midline,  neck vein prominence Cardio: RRR and no murmur Resp: CTA B/L and unlabored GI: BS positive and NT< ND Extremity:  Edema LUE> LLE Skin:   Bruise multiple ecchymotic areas BUE Neuro: alert,Unable to close eyes to command  Cranial Nerve Abnormalities RIght central 7, Abnormal  Sensory cannot assess due to aphasia, Abnormal Motor 0/5 RUE, 0/5  RLE, antigravity on Left  , Dysarthric, MAS 1-2 in R pec and biceps, wrist/fingers  Aphasic and Apraxic---repeats same phrase/word typically Musc/Skel:  No pain with RUE ROM Gen NAD,    Assessment/Plan: 1. Functional deficits secondary to Large left MCA infarct embolic which require 3+ hours per day of interdisciplinary therapy in a comprehensive inpatient rehab setting. Physiatrist is providing close team supervision and 24 hour management of active medical problems listed below. Physiatrist and rehab team continue to assess barriers to discharge/monitor patient progress toward functional and medical goals.  SNF tomorrow  FIM: FIM - Bathing Bathing Steps Patient Completed: Chest, Right Arm, Left upper leg Bathing: 2: Max-Patient completes 3-4 77f 10 parts or 25-49%  FIM - Upper Body Dressing/Undressing Upper body dressing/undressing steps patient completed: Thread/unthread left sleeve of pullover shirt/dress Upper body dressing/undressing: 2: Max-Patient completed 25-49% of tasks FIM - Lower Body Dressing/Undressing Lower body dressing/undressing: 1: Total-Patient completed less than 25% of tasks  FIM - Toileting Toileting: 1: Two helpers  FIM - Radio producer Devices: Recruitment consultant Transfers: 1-Two helpers  FIM - Engineer, site  Assistive Devices: Arm rests Bed/Chair Transfer: 1: Two helpers, 1: Supine > Sit: Total A (helper does all/Pt. < 25%), 1: Bed > Chair or W/C: Total A (helper does all/Pt. < 25%)  FIM - Locomotion: Wheelchair Distance: 20 Locomotion: Wheelchair: 0: Activity did not occur FIM - Locomotion: Ambulation Locomotion: Ambulation: 0: Activity did not occur  Comprehension Comprehension Mode: Auditory Comprehension: 2-Understands basic 25 - 49% of the time/requires cueing 51 - 75% of the time  Expression Expression Mode:  Nonverbal Expression Assistive Devices:  (head nods) Expression: 1-Expresses basis less than 25% of the time/requires cueing greater than 75% of the time.  Social Interaction Social Interaction: 2-Interacts appropriately 25 - 49% of time - Needs frequent redirection.  Problem Solving Problem Solving: 2-Solves basic 25 - 49% of the time - needs direction more than half the time to initiate, plan or complete simple activities  Memory Memory Mode: Not assessed (unable to assess because of aphasia) Memory: 1-Recognizes or recalls less than 25% of the time/requires cueing greater than 75% of the time  Medical Problem List and Plan: 1. Functional deficits secondary to Large L-MCA infarct with dense right hemiplegia, dysphagia and global aphasia.   2.  DVT Prophylaxis/Anticoagulation: Pharmaceutical  Coumadin for DVT 3. Pain Management: tramadol, tylenol, appropriate turning in bed, regular checks for comfort 4. Mood: LCSW to follow for evaluation and support.   5. Neuropsych: This patient is not capable of making decisions on her own behalf. 6. Skin/Wound Care:  Turn patient every 4 hours.  Rehab RN to monitor skin daily.  Maintain adequate nutrition and hydration status.   7. Fluids/Electrolytes/Nutrition:  Monitor I/O.Off IVF   -recheck bmet today  -encouraging po, full supervision with feeding 8. Hypertension: normotensive at present 9.  Thyroid mass question carcinoma, further eval as pt recovers from severe CVA 10.  Hx bilateral knee OA, saw ortho as outpt, diclofenac gel 11.  Thrombocytopenia stable- bruising but no obvious hematoma formation 12.  ecoli UTI, sens to keflex --treated LOS (Days) 22 A FACE TO FACE EVALUATION WAS PERFORMED  SWARTZ,ZACHARY T 04/04/2014, 9:53 AM

## 2014-04-04 NOTE — Plan of Care (Signed)
Problem: RH Bed to Chair Transfers Goal: LTG Patient will perform bed/chair transfers w/assist (PT) LTG: Patient will perform bed/chair transfers with assistance, with/without cues (PT).  Outcome: Not Met (add Reason) Goal not met secondary to pt requiring Total A of single therapist to transfer from bed <> w/c.

## 2014-04-04 NOTE — Progress Notes (Signed)
Social Work Patient ID: Sandy Barrett, female   DOB: 08-Mar-1932, 81 y.o.   MRN: [id number] Spoke with daughter-Teresa who will complete paperwork with [facility] this afternoon, in preparation of transfer tomorrow. Aware of her upgrade in diet to nectar thick liquids. See in am tomorrow.

## 2014-04-05 ENCOUNTER — Ambulatory Visit (HOSPITAL_COMMUNITY): Payer: Medicare Other | Admitting: Speech Pathology

## 2014-04-05 ENCOUNTER — Telehealth: Payer: Self-pay | Admitting: *Deleted

## 2014-04-05 DIAGNOSIS — B351 Tinea unguium: Secondary | ICD-10-CM | POA: Diagnosis not present

## 2014-04-05 DIAGNOSIS — E049 Nontoxic goiter, unspecified: Secondary | ICD-10-CM | POA: Diagnosis not present

## 2014-04-05 DIAGNOSIS — E042 Nontoxic multinodular goiter: Secondary | ICD-10-CM | POA: Diagnosis not present

## 2014-04-05 DIAGNOSIS — I82C12 Acute embolism and thrombosis of left internal jugular vein: Secondary | ICD-10-CM | POA: Diagnosis not present

## 2014-04-05 DIAGNOSIS — G4700 Insomnia, unspecified: Secondary | ICD-10-CM | POA: Diagnosis not present

## 2014-04-05 DIAGNOSIS — M1712 Unilateral primary osteoarthritis, left knee: Secondary | ICD-10-CM | POA: Diagnosis not present

## 2014-04-05 DIAGNOSIS — I69351 Hemiplegia and hemiparesis following cerebral infarction affecting right dominant side: Secondary | ICD-10-CM | POA: Diagnosis not present

## 2014-04-05 DIAGNOSIS — G629 Polyneuropathy, unspecified: Secondary | ICD-10-CM | POA: Diagnosis not present

## 2014-04-05 DIAGNOSIS — M150 Primary generalized (osteo)arthritis: Secondary | ICD-10-CM | POA: Diagnosis not present

## 2014-04-05 DIAGNOSIS — I69890 Apraxia following other cerebrovascular disease: Secondary | ICD-10-CM | POA: Diagnosis not present

## 2014-04-05 DIAGNOSIS — I69320 Aphasia following cerebral infarction: Secondary | ICD-10-CM | POA: Diagnosis not present

## 2014-04-05 DIAGNOSIS — I69820 Aphasia following other cerebrovascular disease: Secondary | ICD-10-CM | POA: Diagnosis not present

## 2014-04-05 DIAGNOSIS — G8190 Hemiplegia, unspecified affecting unspecified side: Secondary | ICD-10-CM | POA: Diagnosis not present

## 2014-04-05 DIAGNOSIS — I639 Cerebral infarction, unspecified: Secondary | ICD-10-CM | POA: Diagnosis not present

## 2014-04-05 DIAGNOSIS — B079 Viral wart, unspecified: Secondary | ICD-10-CM | POA: Diagnosis not present

## 2014-04-05 DIAGNOSIS — M7541 Impingement syndrome of right shoulder: Secondary | ICD-10-CM | POA: Diagnosis not present

## 2014-04-05 DIAGNOSIS — D509 Iron deficiency anemia, unspecified: Secondary | ICD-10-CM | POA: Diagnosis not present

## 2014-04-05 DIAGNOSIS — I82413 Acute embolism and thrombosis of femoral vein, bilateral: Secondary | ICD-10-CM | POA: Diagnosis not present

## 2014-04-05 DIAGNOSIS — K5900 Constipation, unspecified: Secondary | ICD-10-CM | POA: Diagnosis not present

## 2014-04-05 DIAGNOSIS — I2699 Other pulmonary embolism without acute cor pulmonale: Secondary | ICD-10-CM | POA: Diagnosis not present

## 2014-04-05 DIAGNOSIS — R482 Apraxia: Secondary | ICD-10-CM | POA: Diagnosis not present

## 2014-04-05 DIAGNOSIS — R4702 Dysphasia: Secondary | ICD-10-CM | POA: Diagnosis not present

## 2014-04-05 DIAGNOSIS — I6340 Cerebral infarction due to embolism of unspecified cerebral artery: Secondary | ICD-10-CM | POA: Diagnosis not present

## 2014-04-05 DIAGNOSIS — I82403 Acute embolism and thrombosis of unspecified deep veins of lower extremity, bilateral: Secondary | ICD-10-CM | POA: Diagnosis not present

## 2014-04-05 DIAGNOSIS — M6289 Other specified disorders of muscle: Secondary | ICD-10-CM | POA: Diagnosis not present

## 2014-04-05 DIAGNOSIS — K219 Gastro-esophageal reflux disease without esophagitis: Secondary | ICD-10-CM | POA: Diagnosis not present

## 2014-04-05 DIAGNOSIS — I69891 Dysphagia following other cerebrovascular disease: Secondary | ICD-10-CM | POA: Diagnosis not present

## 2014-04-05 DIAGNOSIS — I63412 Cerebral infarction due to embolism of left middle cerebral artery: Secondary | ICD-10-CM | POA: Diagnosis not present

## 2014-04-05 DIAGNOSIS — M6281 Muscle weakness (generalized): Secondary | ICD-10-CM | POA: Diagnosis not present

## 2014-04-05 DIAGNOSIS — G43009 Migraine without aura, not intractable, without status migrainosus: Secondary | ICD-10-CM | POA: Diagnosis not present

## 2014-04-05 DIAGNOSIS — R278 Other lack of coordination: Secondary | ICD-10-CM | POA: Diagnosis not present

## 2014-04-05 DIAGNOSIS — I82433 Acute embolism and thrombosis of popliteal vein, bilateral: Secondary | ICD-10-CM | POA: Diagnosis not present

## 2014-04-05 DIAGNOSIS — I253 Aneurysm of heart: Secondary | ICD-10-CM | POA: Diagnosis not present

## 2014-04-05 DIAGNOSIS — I69921 Dysphasia following unspecified cerebrovascular disease: Secondary | ICD-10-CM | POA: Diagnosis not present

## 2014-04-05 DIAGNOSIS — I10 Essential (primary) hypertension: Secondary | ICD-10-CM | POA: Diagnosis not present

## 2014-04-05 DIAGNOSIS — M25559 Pain in unspecified hip: Secondary | ICD-10-CM | POA: Diagnosis not present

## 2014-04-05 DIAGNOSIS — G8110 Spastic hemiplegia affecting unspecified side: Secondary | ICD-10-CM | POA: Diagnosis not present

## 2014-04-05 DIAGNOSIS — M799 Soft tissue disorder, unspecified: Secondary | ICD-10-CM | POA: Diagnosis not present

## 2014-04-05 DIAGNOSIS — M1710 Unilateral primary osteoarthritis, unspecified knee: Secondary | ICD-10-CM | POA: Diagnosis not present

## 2014-04-05 DIAGNOSIS — E079 Disorder of thyroid, unspecified: Secondary | ICD-10-CM | POA: Diagnosis not present

## 2014-04-05 DIAGNOSIS — N390 Urinary tract infection, site not specified: Secondary | ICD-10-CM | POA: Diagnosis not present

## 2014-04-05 LAB — PROTIME-INR
INR: 2.13 — ABNORMAL HIGH (ref 0.00–1.49)
Prothrombin Time: 24 seconds — ABNORMAL HIGH (ref 11.6–15.2)

## 2014-04-05 LAB — CBC
HCT: 33.9 % — ABNORMAL LOW (ref 36.0–46.0)
Hemoglobin: 10.5 g/dL — ABNORMAL LOW (ref 12.0–15.0)
MCH: 29.7 pg (ref 26.0–34.0)
MCHC: 31 g/dL (ref 30.0–36.0)
MCV: 95.8 fL (ref 78.0–100.0)
Platelets: 707 10*3/uL — ABNORMAL HIGH (ref 150–400)
RBC: 3.54 MIL/uL — ABNORMAL LOW (ref 3.87–5.11)
RDW: 17.3 % — ABNORMAL HIGH (ref 11.5–15.5)
WBC: 6.2 10*3/uL (ref 4.0–10.5)

## 2014-04-05 MED ORDER — WARFARIN SODIUM 4 MG PO TABS
4.0000 mg | ORAL_TABLET | Freq: Once | ORAL | Status: DC
  Filled 2014-04-05: qty 1

## 2014-04-05 MED ORDER — WARFARIN SODIUM 3 MG PO TABS: 3.0000 mg | ORAL_TABLET | Freq: Every day | ORAL | Status: DC

## 2014-04-05 MED ORDER — POLYSACCHARIDE IRON COMPLEX 150 MG PO CAPS: 150.0000 mg | ORAL_CAPSULE | Freq: Two times a day (BID) | ORAL | Status: DC

## 2014-04-05 MED ORDER — CETYLPYRIDINIUM CHLORIDE 0.05 % MT LIQD: 7.0000 mL | Freq: Two times a day (BID) | OROMUCOSAL | Status: DC

## 2014-04-05 MED ORDER — ACETAMINOPHEN 325 MG PO TABS: 325.0000 mg | ORAL_TABLET | ORAL | Status: DC | PRN

## 2014-04-05 MED ORDER — TRAMADOL HCL 50 MG PO TABS: 50.0000 mg | ORAL_TABLET | Freq: Four times a day (QID) | ORAL | Status: DC | PRN

## 2014-04-05 NOTE — Progress Notes (Signed)
Speech Language Pathology Discharge Summary  Patient Details  Name: Sandy Barrett MRN: [id number] Date of Birth: 1932-03-28  Today's Date: 04/05/2014 SLP Individual Time: 0272-5366 SLP Individual Time Calculation (min): 30 min   Skilled Therapeutic Interventions:  Pt was seen for skilled ST targeting dysphagia goals.  Upon arrival, pt was seated upright in wheelchair, awake, alert, and agreeable to participate in [place].  Pt consumed dys 1 textures with nectar thick liquids with hand over hand assist for initiation of using spoon to scoop food off of her plate.  Once pt began feeding herself, SLP provided min-mod verbal, visual, and tactile cues for monitoring and correction of anterior loss of materials as well as for rate and portion control.  No audible wetness or congestion was noted with pureed textures or nectar thick liquids via cup sips.  Recommend that pt continue on dys 1, nectar thick liquids.  Pt is on track for discharge today.       Patient has met 6 of 9 long term goals.  Patient to discharge at [facility] Max;Total level.  Reasons goals not met:     Clinical Impression/Discharge Summary:  Pt made slow, limited functional gains and is discharging having met 6 out of 9 long term goals.  Pt continues to present with a severe global aphasia (expressive more impaired than receptive) and apraxia.  Pt's yes/no responses and ability to follow 1-step commands remain inconsistent but have been steadily improving for basic, biographical information and very basic, familiar functional tasks.  Pt remains nonverbal with vocalizations characterized by perseverative jargon, of which the pt has little awareness.  Furthermore, pt presents with decreased initiation, intellectual awareness of deficits, and sustained attention to tasks which impact all higher level cognitive processes.   Pt has been recently upgraded to nectar thick liquids due to improved clinical s/s of aspiration noted at bedside,  and  timeliness/automaticity of swallow initiation evident on MBS completed yesterday (04/04/2014).    Pt remains on dys 1 textures due to right sided labial, lingual, and buccal weakness which are further exacerbated by motor planning and cognitive deficits and result in right sided pocketing and anterior loss of materials as well as delayed oral transit of boluses.  Pt requires full supervision for use of slow rate, small bites/sips, checking for pocketing, as well as monitoring and correcting anterior loss.  SLP recommends close follow up for diet tolerance at next level of care and a repeat objective swallow study prior to liquids advancement due to the extent of pt's neurological impairment.    Pt requires continued ST intervention at next level of care due to significant loss in independence from previous level of function.  Pt and family education is complete at this time.    Care Partner:  Caregiver Able to Provide Assistance: Other (comment) (SNF )  Type of Caregiver Assistance:  (SNF)  Recommendation:  24 hour supervision/assistance;Home Health SLP;Outpatient SLP;Skilled Nursing facility  Rationale for SLP Follow Up: Maximize functional communication;Maximize swallowing safety;Maximize cognitive function and independence;Reduce caregiver burden   Equipment: none recommended by SLP    Reasons for discharge: Discharged from hospital   Patient/Family Agrees with Progress Made and Goals Achieved: Yes   See FIM for current functional status  PageSelinda Orion 04/05/2014, 11:39 AM

## 2014-04-05 NOTE — Progress Notes (Signed)
ANTICOAGULATION CONSULT NOTE - Follow Up Consult  Pharmacy Consult for coumadin Indication: DVT/PE  No Known Allergies  Patient Measurements: Height: 5\' 5"  (165.1 cm) Weight: 170 lb 3.1 oz (77.2 kg) IBW/kg (Calculated) : 57 Heparin Dosing Weight:   Vital Signs: Temp: 98.3 F (36.8 C) (03/31 0508) Temp Source: Oral (03/31 0508) BP: 126/67 mmHg (03/31 0508) Pulse Rate: 82 (03/31 0508)  Labs:  Recent Labs  04/03/14 0559 04/04/14 0437 04/04/14 1059 04/05/14 0724  HGB 10.1*  --   --  10.5*  HCT 33.0*  --   --  33.9*  PLT 693*  --   --  707*  LABPROT 26.1* 22.8*  --  24.0*  INR 2.37* 1.99*  --  2.13*  CREATININE  --   --  0.49*  --     Estimated Creatinine Clearance: 58.6 mL/min (by C-G formula based on Cr of 0.49).   Medications:  Scheduled:  . antiseptic oral rinse  7 mL Mouth Rinse BID  . chlorhexidine  15 mL Mouth Rinse BID  . diclofenac sodium  2 g Topical QID  . feeding supplement (ENSURE)  1 Container Oral TID BM  . iron polysaccharides  150 mg Oral BID AC  . pantoprazole sodium  40 mg Per Tube BID  . Warfarin - Pharmacist Dosing Inpatient   Does not apply q1800   Infusions:    Assessment: 81 yo female with DVT/PE is currently on therapeutic coumadin.  INR today is 2.13 Goal of Therapy:  INR 2-3 Monitor platelets by anticoagulation protocol: Yes   Plan:  Repeat coumadin 4 mg po x1 - INR in am  Quinzell Malcomb, Tsz-Yin 04/05/2014,11:18 AM

## 2014-04-05 NOTE — Plan of Care (Signed)
Problem: RH Attention Goal: LTG Patient will demonstrate focused/sustained (PT) LTG: Patient will demonstrate focused/sustained/selective/alternating/divided attention during functional mobility in specific environment with assist for # of minutes (PT)  Outcome: Not Met (add Reason) Pt requires max cueing for sustained attention x90 seconds in controlled environment.

## 2014-04-05 NOTE — Plan of Care (Signed)
Problem: RH Cognition - SLP Goal: RH LTG Patient will demonstrate orientation with cues LTG: Patient will demonstrate orientation to person/place/time/situation with cues (SLP)  Outcome: Not Met (add Reason) Pt remains nonverbal with apraxic errors making it difficult for her to effectively communicate orientation via verbal or nonverbal means (pointing, yes/no responses, gestures).    Problem: RH Problem Solving Goal: LTG Patient will demonstrate problem solving for (SLP) LTG: Patient will demonstrate problem solving for basic/complex daily situations with cues (SLP)  Outcome: Not Met (add Reason) Pt continues to require hand over hand assist for initiation during most basic, familiar tasks   Problem: RH Awareness Goal: LTG: Patient will demonstrate intellectual/emergent (SLP) LTG: Patient will demonstrate intellectual/emergent/anticipatory awareness with assist during a cognitive/linguistic activity (SLP)  Outcome: Not Met (add Reason) Requires total assist for recall and awareness of deficits.

## 2014-04-05 NOTE — Progress Notes (Signed)
Occupational Therapy Discharge Summary  Patient Details  Name: Janessa Mickle MRN: [id number] Date of Birth: 10-Nov-1932  Patient has met 6 of 12 long term goals due to improved activity tolerance, improved attention, improved awareness and improved ability to scan to Rt visual field.  Patient to discharge at overall Max- Total assist level.  Patient's care partner unavailable to provide the necessary physical and cognitive assistance at discharge.    Reasons goals not met: Pt currently requires total assist for LB dressing and total assist with all standing tasks, still requires +2 with transfers secondary to cognitive deficits.  Recommendation:  Patient will benefit from ongoing skilled OT services in skilled nursing facility setting to continue to advance functional skills in the area of BADL and Reduce care partner burden.  Equipment: No equipment provided  Reasons for discharge: discharge from hospital  Patient/family agrees with progress made and goals achieved: Yes  OT Discharge Precautions/Restrictions  Precautions Precautions: Fall Precaution Comments: Lt gaze preference Restrictions Weight Bearing Restrictions: No Other Position/Activity Restrictions: R shoulder with sublux General   Vital Signs Therapy Vitals Temp: 98.3 F (36.8 C) Temp Source: Oral Pulse Rate: 82 Resp: 18 BP: 126/67 mmHg Patient Position (if appropriate): Lying Oxygen Therapy SpO2: 93 % O2 Device: Not Delivered Pain Pain Assessment Pain Assessment: No/denies pain ADL  See FIM Vision/Perception  Vision- Assessment Vision Assessment?: Vision impaired- to be further tested in functional context Additional Comments: Lt gaze preference, able to scan to Rt to locate items   Cognition Overall Cognitive Status: Impaired/Different from baseline Arousal/Alertness: Awake/alert Orientation Level: Other (comment) (Pt is nonverbal, unable to assess) Attention: Focused;Sustained (able to sustain  attention with basic, familiar dressing tasks) Memory: Impaired Awareness: Impaired Awareness Impairment: Intellectual impairment Problem Solving: Impaired Problem Solving Impairment: Verbal basic;Functional basic Behaviors: Perseveration Comments: Decreased Rt attention, perseveration, ideational apraxia, difficulty to fully assess cognition secondary to decreased verbalizations Sensation Sensation Light Touch: Impaired Detail Light Touch Impaired Details: Impaired RLE;Impaired RUE Stereognosis: Not tested Hot/Cold: Not tested Proprioception: Impaired Detail Proprioception Impaired Details: Impaired RUE;Impaired RLE Coordination Gross Motor Movements are Fluid and Coordinated: No Fine Motor Movements are Fluid and Coordinated: No Coordination and Movement Description: RUE flaccid with shoulder subluxation and Rt inattention Finger Nose Finger Test: unable to assess due to flaccid RUE Heel Shin Test: Unable to assess secondary to significant weakness in RLE. Motor  Motor Motor: Hemiplegia;Abnormal postural alignment and control;Motor apraxia Motor - Discharge Observations: R hemiplegia Extremity/Trunk Assessment RUE Assessment RUE Assessment: Exceptions to [facility] (flaccid, shoulder subluxation, also with prior rotator cuff injury.) LUE Assessment LUE Assessment: Within Functional Limits (not formally assessed, but able to utilize with self-care tasks)  See FIM for current functional status  Jery Hollern, [facility] 04/05/2014, 8:23 AM

## 2014-04-05 NOTE — Progress Notes (Signed)
Social Work Elease Hashimoto, LCSW Social Worker Signed  Patient Care Conference 04/05/2014  8:29 AM    Expand All Collapse All   Inpatient RehabilitationTeam Conference and Plan of Care Update Date: 04/04/2014   Time: 1;15 PM     Patient Name: Sandy Barrett       Medical Record Number: [id number]  Date of Birth: [birth year] Sex: Female         Room/Bed: 4W20C/4W20C-01 Payor Info: Payor: MEDICARE / Plan: MEDICARE PART A AND B / Product Type: *No Product type* /    Admitting Diagnosis: L MCA CVA   Admit Date/Time:  03/13/2014  4:08 PM Admission Comments: No comment available   Primary Diagnosis:  Cerebral infarction due to embolism of left middle cerebral artery Principal Problem: Cerebral infarction due to embolism of left middle cerebral artery    Patient Active Problem List     Diagnosis  Date Noted   .  Acute pulmonary embolism  03/14/2014   .  DVT of lower extremity, bilateral  03/14/2014   .  Global aphasia  03/14/2014   .  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  Dysphagia due to recent stroke  03/14/2014   .  Aphasia due to stroke  03/13/2014   .  Right hemiparesis  03/13/2014   .  Embolic stroke     .  Cerebral infarction due to embolism of left middle cerebral artery     .  Cerebral infarction due to unspecified mechanism     .  Respiratory failure     .  Cerebral infarction due to thrombosis of right middle cerebral artery     .  Stroke  03/08/2014   .  Pulmonary embolism  03/08/2014   .  CVA (cerebral infarction)     .  Right sided weakness     .  Back pain  08/29/2013   .  Primary localized osteoarthrosis, lower leg  03/06/2013   .  IBS (irritable bowel syndrome)     .  Jugular vein thrombosis  03/01/2011   .  Multinodular goiter  01/13/2011   .  Abnormal chest x-ray  12/12/2010   .  HYPERTHYROIDISM  11/11/2009   .  INSOMNIA  07/03/2009   .  POSTTRAUMATIC STRESS DISORDER  08/22/2008   .  OBESITY  04/21/2008   .  OSTEOARTHRITIS  04/21/2008   .  MIGRAINE  HEADACHE  04/20/2008   .  Essential hypertension  04/20/2008     Expected Discharge Date: Expected Discharge Date: 04/05/14  Team Members Present: Physician leading conference: Dr. Alger Simons Social Worker Present: Ovidio Kin, LCSW Nurse Present: Elliot Cousin, RN PT Present: Raylene Everts, PT;Blair [name], PT OT Present: Simonne Come, OT SLP Present: Windell Moulding, SLP PPS Coordinator present : Daiva Nakayama, RN, CRRN        Current Status/Progress  Goal  Weekly Team Focus   Medical     improved swallow. still with severe aphasia. spastic right hemiparesis  rom, spsticity mgt,   nutrition/swallowing   Bowel/Bladder       incont B & B    timed tolieting-to improve continence    timed tolieting   Swallow/Nutrition/ Hydration     Dys 1, nectar thick liquids, full supervision for rate/portion control  min assist with least restrictive diet    toleration of nectar thick liquids prior to discharge    ADL's     max assist self-care tasks at bed level, +2 with  transfers.  Pt is demonstrating increased initiation with very basic functional tasks, continues to require hand over hand to initiate and carry through with tasks. Pt is demonstrating increased awareness of RUE and attention to Rt.   max assist overall  initiation, Rt attention, transfers, sit <> stand    Mobility     Max A bed mobility, Total A to +2A transfers, Min A dynamic sitting balance  Min A w/c mobility; Mod A bed mobility and transfers   Management of RUE during mobility, initiation, bed mobility, dynamic sitting balance, functional transfers, w/c mobility, D/C planning    Communication     total assist for functional communication  max assist   multimodal communication, initiation and cessation of verbal and nonverbal communication attempts, automatics    Safety/Cognition/ Behavioral Observations    mod-max assist for sustained attention, initiation, perseveration, intellectual awareness of errors   max assist for basic  tasks   sustained attention, initiation, and cessation, during basic, familiar tasks    Pain       no pain complaints-monitor         Skin       prafo boots when in bed-protect heels    monitor skin q shift         *See Care Plan and progress notes for long and short-term goals.    Barriers to Discharge:  profound deficits which require substantial assist      Possible Resolutions to Barriers:   supervision further nursing care after discharge from here     Discharge Planning/Teaching Needs:   NH bed for tomorrow-daughter's and pt agreeable plan for transfer tomorrow       Team Discussion:    Did better on MBS-upgraded to nectar thick liquids. Iniates more in therapies,labs look good-medically ready for transfer to NH. Has started to propel wheelchair in therapies   Revisions to Treatment Plan:    NH bed 3/31    Continued Need for Acute Rehabilitation Level of Care: The patient requires daily medical management by a physician with specialized training in physical medicine and rehabilitation for the following conditions: Daily direction of a multidisciplinary physical rehabilitation program to ensure safe treatment while eliciting the highest outcome that is of practical value to the patient.: Yes Daily medical management of patient stability for increased activity during participation in an intensive rehabilitation regime.: Yes Daily analysis of laboratory values and/or radiology reports with any subsequent need for medication adjustment of medical intervention for : Neurological problems;Other;Cardiac problems  Elease Hashimoto 04/05/2014, 12:39 PM                 Elease Hashimoto, LCSW Social Worker Signed  Patient Care Conference 03/28/2014  2:04 PM    Expand All Collapse All   Inpatient RehabilitationTeam Conference and Plan of Care Update Date: 03/28/2014   Time: 10;35 AM     Patient Name: Sandy Barrett       Medical Record Number: [id number]  Date of Birth:  [birth year] Sex: Female         Room/Bed: 4W20C/4W20C-01 Payor Info: Payor: MEDICARE / Plan: MEDICARE PART A AND B / Product Type: *No Product type* /    Admitting Diagnosis: L MCA CVA   Admit Date/Time:  03/13/2014  4:08 PM Admission Comments: No comment available   Primary Diagnosis:  Cerebral infarction due to embolism of left middle cerebral artery Principal Problem: Cerebral infarction due to embolism of left middle cerebral artery    Patient Active Problem  List     Diagnosis  Date Noted   .  Acute pulmonary embolism  03/14/2014   .  DVT of lower extremity, bilateral  03/14/2014   .  Global aphasia  03/14/2014   .  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  Dysphagia due to recent stroke  03/14/2014   .  Aphasia due to stroke  03/13/2014   .  Right hemiparesis  03/13/2014   .  Embolic stroke     .  Cerebral infarction due to embolism of left middle cerebral artery     .  Cerebral infarction due to unspecified mechanism     .  Respiratory failure     .  Cerebral infarction due to thrombosis of right middle cerebral artery     .  Stroke  03/08/2014   .  Pulmonary embolism  03/08/2014   .  CVA (cerebral infarction)     .  Right sided weakness     .  Back pain  08/29/2013   .  Primary localized osteoarthrosis, lower leg  03/06/2013   .  IBS (irritable bowel syndrome)     .  Jugular vein thrombosis  03/01/2011   .  Multinodular goiter  01/13/2011   .  Abnormal chest x-ray  12/12/2010   .  HYPERTHYROIDISM  11/11/2009   .  INSOMNIA  07/03/2009   .  POSTTRAUMATIC STRESS DISORDER  08/22/2008   .  OBESITY  04/21/2008   .  OSTEOARTHRITIS  04/21/2008   .  MIGRAINE HEADACHE  04/20/2008   .  Essential hypertension  04/20/2008     Expected Discharge Date: Expected Discharge Date: 04/03/14  Team Members Present: Physician leading conference: Dr. Alysia Penna Social Worker Present: Ovidio Kin, LCSW Nurse Present: Heather Roberts, RN PT Present: Georjean Mode, PT;Blair [name], PT OT  Present: Simonne Come, Dorothyann Gibbs, OT SLP Present: Windell Moulding, SLP PPS Coordinator present : Daiva Nakayama, RN, CRRN        Current Status/Progress  Goal  Weekly Team Focus   Medical     severe aphasia, apraxia, R HP, DVT on po anticoag, intake increasing  adequate caloric intake   monitor hydration   Bowel/Bladder     Patient is generally incontinent of bowel and bladder, occsionally continent  to be continent of bowel and bladder with mod assist   timed toileting q2h and after meals    Swallow/Nutrition/ Hydration     Dys 1 pudding thick liquids via teasoon, full supervision, hand over hand assist, minimal overt s/s of aspiration   min assist with least restrictive diet    trials of honey thick liquids for liquids progression    ADL's     max assist with bed mobility, +2 with clothing management at bed level and at sit > stand level, +2 for transfers or use of Stedy. Pt's cognitive deficits severly impact her ability to participate in functional tasks, noted improvements in very basic functional tasks.   mod assist overall, most likely will have to downgrade to max transfers and self-care tasks  initiation, Rt attention, transfers, sit <> stand    Mobility     Max A bed mobility, +2A transfers, supervision to Mod A sitting balance  Min A w/c mobility; Mod A bed mobility and transfers   Attention to R side of body, functional transfers, bed mobility, dynamic sitting balance   Communication     total assist for functional communication, receptive > expressive  max assist   multimodal communication, initiation and cessation of verbal and nonverbal communication attempts, automatic sequences    Safety/Cognition/ Behavioral Observations    mod-max assist sustained attention, initiation, perseveration, poor awareness of verbal errors   max assist for basic tasks   sustained attention, initiation and cessation during basic, familiar tasks    Pain     patient denies pain  pain less than or  equal to 4 on a scale of 0-10   assess pain q4h and medicate as indicated    Skin     MASD to bilateral buttocks, boggy heels   prevent fuirther skin injury/breakdown   keep skin clean and dry, bilateral prafo boots when in bed      *See Care Plan and progress notes for long and short-term goals.    Barriers to Discharge:  heavy assist, multiple med issues     Possible Resolutions to Barriers:         Discharge Planning/Teaching Needs:   Daughter's continue to be here and attend therapies with pt.  ALso touring NH's and aware pt will be ready early next week for transfer.        Team Discussion:    Pt making very slow progress, tone better in r-knee. Continues to be plus 2 total assist.  May be able to upgrade diet to honey thick from pudding. Y/N more consistent. Treating UTI. Maintaining her hydration and caloric intake.   Revisions to Treatment Plan:    NHP, downgrade goals to max assist    Continued Need for Acute Rehabilitation Level of Care: The patient requires daily medical management by a physician with specialized training in physical medicine and rehabilitation for the following conditions: Daily direction of a multidisciplinary physical rehabilitation program to ensure safe treatment while eliciting the highest outcome that is of practical value to the patient.: Yes Daily medical management of patient stability for increased activity during participation in an intensive rehabilitation regime.: Yes Daily analysis of laboratory values and/or radiology reports with any subsequent need for medication adjustment of medical intervention for : Neurological problems;Other  Elease Hashimoto 03/29/2014, 8:56 AM                 Elease Hashimoto, LCSW Social Worker Signed  Patient Care Conference 03/21/2014  1:46 PM    Expand All Collapse All   Inpatient RehabilitationTeam Conference and Plan of Care Update Date: 03/21/2014   Time: 10;50 am     Patient Name: Sandy Barrett        Medical Record Number: [id number]  Date of Birth: 26-Mar-1932 Sex: Female         Room/Bed: 4W20C/4W20C-01 Payor Info: Payor: MEDICARE / Plan: MEDICARE PART A AND B / Product Type: *No Product type* /    Admitting Diagnosis: L MCA CVA   Admit Date/Time:  03/13/2014  4:08 PM Admission Comments: No comment available   Primary Diagnosis:  Cerebral infarction due to embolism of left middle cerebral artery Principal Problem: Cerebral infarction due to embolism of left middle cerebral artery    Patient Active Problem List     Diagnosis  Date Noted   .  Acute pulmonary embolism  03/14/2014   .  DVT of lower extremity, bilateral  03/14/2014   .  Global aphasia  03/14/2014   .  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  Dysphagia due to recent stroke  03/14/2014   .  Aphasia due to stroke  03/13/2014   .  Right hemiparesis  03/13/2014   .  Embolic stroke     .  Cerebral infarction due to embolism of left middle cerebral artery     .  Cerebral infarction due to unspecified mechanism     .  Respiratory failure     .  Cerebral infarction due to thrombosis of right middle cerebral artery     .  Stroke  03/08/2014   .  Pulmonary embolism  03/08/2014   .  CVA (cerebral infarction)     .  Right sided weakness     .  Back pain  08/29/2013   .  Primary localized osteoarthrosis, lower leg  03/06/2013   .  IBS (irritable bowel syndrome)     .  Jugular vein thrombosis  03/01/2011   .  Multinodular goiter  01/13/2011   .  Abnormal chest x-ray  12/12/2010   .  HYPERTHYROIDISM  11/11/2009   .  INSOMNIA  07/03/2009   .  POSTTRAUMATIC STRESS DISORDER  08/22/2008   .  OBESITY  04/21/2008   .  OSTEOARTHRITIS  04/21/2008   .  MIGRAINE HEADACHE  04/20/2008   .  Essential hypertension  04/20/2008     Expected Discharge Date: Expected Discharge Date: 04/03/14  Team Members Present: Physician leading conference: Dr. Alysia Penna Social Worker Present: Ovidio Kin, LCSW Nurse Present: Heather Roberts,  RN PT Present: Georjean Mode, PT;Blair [name], PT OT Present: Simonne Come, Dorothyann Gibbs, OT SLP Present: Windell Moulding, SLP PPS Coordinator present : Daiva Nakayama, RN, CRRN        Current Status/Progress  Goal  Weekly Team Focus   Medical     Severe aphasia, severe apraxia, severe right hemiparesis, DVT requiring anticoagulation  Maintain medical stability during rehabilitation stay  Monitor for signs of bleeding while on dual anticoagulants   Bowel/Bladder     patient is incontinent of bowel and  bladder   to become continent of bowel and bladder with mod assist   offer timed toileting   Swallow/Nutrition/ Hydration     Dys 1, pudding thick liquids via teaspoon, full supervision and hand over hand assist for swallowing precautions and self feeding   min assist with least restrictive diet    diet toleration    ADL's     total assist bed mobility, +2 for clothing management at bed level and at sit > stand level, +2 transfers.  Pt's cogntive deficits severly impacting her ability to participate in functional tasks   mod assist overall  initiation, Rt attention, transfers, sit <> stand    Mobility     Total A bed mobility, Total to +2A for basic transfers;   Min A bed mobility and w/c mobility; Mod A transfers   Initiation, attention to R visual field/hemi body, functional transfers, management of RLE tone   Communication     total assist for functional communication  max assist   multimodal communication, initiation and cessation of verbal and nonverbal communication   Safety/Cognition/ Behavioral Observations    decreased sustained attention, decreased alertness, poor initiation with perseveratioin   max assist for basic tasks   sustained attention, initiation and cessation during basic,familiar tasks    Pain     patient denies pain  pain less than or equal to 4 on a scale of 0-10  assess pain q4h and offer medication as indicated    Skin     MASD to bilateral butocks, boggy heels    prevent further skin injury/breakdown  keep skin clean and dry, wear prafo boots when in bed.      *See Care Plan and progress notes for long and short-term goals.    Barriers to Discharge:  Heavy assist level, multiple medical comorbidities     Possible Resolutions to Barriers:   Continue rehabilitation program     Discharge Planning/Teaching Needs:   daughter's here daily but feel best option is NHP upon discharge from rehab for longer rehab and more therapies.       Team Discussion:    Sounds less congested-more alert. Improvement but not enough for FIM change. R-inattention and initiation. Severe global aphasia. DYs 1 pudding thick she is hydrating herself on this diet. Medically needs to be here 3 weeks then to NH   Revisions to Treatment Plan:    Probably NH after rehab for more therapies and time to recover    Continued Need for Acute Rehabilitation Level of Care: The patient requires daily medical management by a physician with specialized training in physical medicine and rehabilitation for the following conditions: Daily direction of a multidisciplinary physical rehabilitation program to ensure safe treatment while eliciting the highest outcome that is of practical value to the patient.: Yes Daily medical management of patient stability for increased activity during participation in an intensive rehabilitation regime.: Yes Daily analysis of laboratory values and/or radiology reports with any subsequent need for medication adjustment of medical intervention for : Neurological problems;Other  Elease Hashimoto 03/22/2014, 3:17 PM                 Elease Hashimoto, LCSW Social Worker Signed  Patient Care Conference 04/05/2014  8:29 AM    Expand All Collapse All   Inpatient RehabilitationTeam Conference and Plan of Care Update Date: 04/04/2014   Time: 1;15 PM     Patient Name: Sandy Barrett       Medical Record Number: [id number]  Date of Birth: 03-06-1932 Sex: Female          Room/Bed: 4W20C/4W20C-01 Payor Info: Payor: MEDICARE / Plan: MEDICARE PART A AND B / Product Type: *No Product type* /    Admitting Diagnosis: L MCA CVA   Admit Date/Time:  03/13/2014  4:08 PM Admission Comments: No comment available   Primary Diagnosis:  Cerebral infarction due to embolism of left middle cerebral artery Principal Problem: Cerebral infarction due to embolism of left middle cerebral artery    Patient Active Problem List     Diagnosis  Date Noted   .  Acute pulmonary embolism  03/14/2014   .  DVT of lower extremity, bilateral  03/14/2014   .  Global aphasia  03/14/2014   .  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  Dysphagia due to recent stroke  03/14/2014   .  Aphasia due to stroke  03/13/2014   .  Right hemiparesis  03/13/2014   .  Embolic stroke     .  Cerebral infarction due to embolism of left middle cerebral artery     .  Cerebral infarction due to unspecified mechanism     .  Respiratory failure     .  Cerebral infarction due to thrombosis of right middle cerebral artery     .  Stroke  03/08/2014   .  Pulmonary embolism  03/08/2014   .  CVA (cerebral infarction)     .  Right sided weakness     .  Back pain  08/29/2013   .  Primary  localized osteoarthrosis, lower leg  03/06/2013   .  IBS (irritable bowel syndrome)     .  Jugular vein thrombosis  03/01/2011   .  Multinodular goiter  01/13/2011   .  Abnormal chest x-ray  12/12/2010   .  HYPERTHYROIDISM  11/11/2009   .  INSOMNIA  07/03/2009   .  POSTTRAUMATIC STRESS DISORDER  08/22/2008   .  OBESITY  04/21/2008   .  OSTEOARTHRITIS  04/21/2008   .  MIGRAINE HEADACHE  04/20/2008   .  Essential hypertension  04/20/2008     Expected Discharge Date: Expected Discharge Date: 04/05/14  Team Members Present: Physician leading conference: Dr. Alger Simons Social Worker Present: Ovidio Kin, LCSW Nurse Present: Elliot Cousin, RN PT Present: Raylene Everts, PT;Blair [name], PT OT Present: Simonne Come,  OT SLP Present: Windell Moulding, SLP PPS Coordinator present : Daiva Nakayama, RN, CRRN        Current Status/Progress  Goal  Weekly Team Focus   Medical     improved swallow. still with severe aphasia. spastic right hemiparesis  rom, spsticity mgt,   nutrition/swallowing   Bowel/Bladder       incont B & B    timed tolieting-to improve continence    timed tolieting   Swallow/Nutrition/ Hydration     Dys 1, nectar thick liquids, full supervision for rate/portion control  min assist with least restrictive diet    toleration of nectar thick liquids prior to discharge    ADL's     max assist self-care tasks at bed level, +2 with transfers.  Pt is demonstrating increased initiation with very basic functional tasks, continues to require hand over hand to initiate and carry through with tasks. Pt is demonstrating increased awareness of RUE and attention to Rt.   max assist overall  initiation, Rt attention, transfers, sit <> stand    Mobility     Max A bed mobility, Total A to +2A transfers, Min A dynamic sitting balance  Min A w/c mobility; Mod A bed mobility and transfers   Management of RUE during mobility, initiation, bed mobility, dynamic sitting balance, functional transfers, w/c mobility, D/C planning    Communication     total assist for functional communication  max assist   multimodal communication, initiation and cessation of verbal and nonverbal communication attempts, automatics    Safety/Cognition/ Behavioral Observations    mod-max assist for sustained attention, initiation, perseveration, intellectual awareness of errors   max assist for basic tasks   sustained attention, initiation, and cessation, during basic, familiar tasks    Pain       no pain complaints-monitor         Skin       prafo boots when in bed-protect heels    monitor skin q shift         *See Care Plan and progress notes for long and short-term goals.    Barriers to Discharge:  profound deficits which require  substantial assist      Possible Resolutions to Barriers:   supervision further nursing care after discharge from here     Discharge Planning/Teaching Needs:   NH bed for tomorrow-daughter's and pt agreeable plan for transfer tomorrow       Team Discussion:    Did better on MBS-upgraded to nectar thick liquids. Iniates more in therapies,labs look good-medically ready for transfer to NH. Has started to propel wheelchair in therapies   Revisions to Treatment Plan:    NH bed 3/31  Continued Need for Acute Rehabilitation Level of Care: The patient requires daily medical management by a physician with specialized training in physical medicine and rehabilitation for the following conditions: Daily direction of a multidisciplinary physical rehabilitation program to ensure safe treatment while eliciting the highest outcome that is of practical value to the patient.: Yes Daily medical management of patient stability for increased activity during participation in an intensive rehabilitation regime.: Yes Daily analysis of laboratory values and/or radiology reports with any subsequent need for medication adjustment of medical intervention for : Neurological problems;Other;Cardiac problems  Elease Hashimoto 04/05/2014, 12:39 PM                 Elease Hashimoto, LCSW Social Worker Signed  Patient Care Conference 03/28/2014  2:04 PM    Expand All Collapse All   Inpatient RehabilitationTeam Conference and Plan of Care Update Date: 03/28/2014   Time: 10;35 AM     Patient Name: Sandy Barrett       Medical Record Number: [id number]  Date of Birth: 28-Oct-1932 Sex: Female         Room/Bed: 4W20C/4W20C-01 Payor Info: Payor: MEDICARE / Plan: MEDICARE PART A AND B / Product Type: *No Product type* /    Admitting Diagnosis: L MCA CVA   Admit Date/Time:  03/13/2014  4:08 PM Admission Comments: No comment available   Primary Diagnosis:  Cerebral infarction due to embolism of left middle cerebral  artery Principal Problem: Cerebral infarction due to embolism of left middle cerebral artery    Patient Active Problem List     Diagnosis  Date Noted   .  Acute pulmonary embolism  03/14/2014   .  DVT of lower extremity, bilateral  03/14/2014   .  Global aphasia  03/14/2014   .  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  Dysphagia due to recent stroke  03/14/2014   .  Aphasia due to stroke  03/13/2014   .  Right hemiparesis  03/13/2014   .  Embolic stroke     .  Cerebral infarction due to embolism of left middle cerebral artery     .  Cerebral infarction due to unspecified mechanism     .  Respiratory failure     .  Cerebral infarction due to thrombosis of right middle cerebral artery     .  Stroke  03/08/2014   .  Pulmonary embolism  03/08/2014   .  CVA (cerebral infarction)     .  Right sided weakness     .  Back pain  08/29/2013   .  Primary localized osteoarthrosis, lower leg  03/06/2013   .  IBS (irritable bowel syndrome)     .  Jugular vein thrombosis  03/01/2011   .  Multinodular goiter  01/13/2011   .  Abnormal chest x-ray  12/12/2010   .  HYPERTHYROIDISM  11/11/2009   .  INSOMNIA  07/03/2009   .  POSTTRAUMATIC STRESS DISORDER  08/22/2008   .  OBESITY  04/21/2008   .  OSTEOARTHRITIS  04/21/2008   .  MIGRAINE HEADACHE  04/20/2008   .  Essential hypertension  04/20/2008     Expected Discharge Date: Expected Discharge Date: 04/03/14  Team Members Present: Physician leading conference: Dr. Alysia Penna Social Worker Present: Ovidio Kin, LCSW Nurse Present: Heather Roberts, RN PT Present: Georjean Mode, PT;Blair [name], PT OT Present: Simonne Come, Dorothyann Gibbs, OT SLP Present: Windell Moulding, SLP PPS Coordinator present : Daiva Nakayama,  RN, CRRN        Current Status/Progress  Goal  Weekly Team Focus   Medical     severe aphasia, apraxia, R HP, DVT on po anticoag, intake increasing  adequate caloric intake   monitor hydration   Bowel/Bladder     Patient is  generally incontinent of bowel and bladder, occsionally continent  to be continent of bowel and bladder with mod assist   timed toileting q2h and after meals    Swallow/Nutrition/ Hydration     Dys 1 pudding thick liquids via teasoon, full supervision, hand over hand assist, minimal overt s/s of aspiration   min assist with least restrictive diet    trials of honey thick liquids for liquids progression    ADL's     max assist with bed mobility, +2 with clothing management at bed level and at sit > stand level, +2 for transfers or use of Stedy. Pt's cognitive deficits severly impact her ability to participate in functional tasks, noted improvements in very basic functional tasks.   mod assist overall, most likely will have to downgrade to max transfers and self-care tasks  initiation, Rt attention, transfers, sit <> stand    Mobility     Max A bed mobility, +2A transfers, supervision to Mod A sitting balance  Min A w/c mobility; Mod A bed mobility and transfers   Attention to R side of body, functional transfers, bed mobility, dynamic sitting balance   Communication     total assist for functional communication, receptive > expressive   max assist   multimodal communication, initiation and cessation of verbal and nonverbal communication attempts, automatic sequences    Safety/Cognition/ Behavioral Observations    mod-max assist sustained attention, initiation, perseveration, poor awareness of verbal errors   max assist for basic tasks   sustained attention, initiation and cessation during basic, familiar tasks    Pain     patient denies pain  pain less than or equal to 4 on a scale of 0-10   assess pain q4h and medicate as indicated    Skin     MASD to bilateral buttocks, boggy heels   prevent fuirther skin injury/breakdown   keep skin clean and dry, bilateral prafo boots when in bed      *See Care Plan and progress notes for long and short-term goals.    Barriers to Discharge:  heavy  assist, multiple med issues     Possible Resolutions to Barriers:         Discharge Planning/Teaching Needs:   Daughter's continue to be here and attend therapies with pt.  ALso touring NH's and aware pt will be ready early next week for transfer.        Team Discussion:    Pt making very slow progress, tone better in r-knee. Continues to be plus 2 total assist.  May be able to upgrade diet to honey thick from pudding. Y/N more consistent. Treating UTI. Maintaining her hydration and caloric intake.   Revisions to Treatment Plan:    NHP, downgrade goals to max assist    Continued Need for Acute Rehabilitation Level of Care: The patient requires daily medical management by a physician with specialized training in physical medicine and rehabilitation for the following conditions: Daily direction of a multidisciplinary physical rehabilitation program to ensure safe treatment while eliciting the highest outcome that is of practical value to the patient.: Yes Daily medical management of patient stability for increased activity during participation in an intensive  rehabilitation regime.: Yes Daily analysis of laboratory values and/or radiology reports with any subsequent need for medication adjustment of medical intervention for : Neurological problems;Other  Elease Hashimoto 03/29/2014, 8:56 AM                 Elease Hashimoto, LCSW Social Worker Signed  Patient Care Conference 03/21/2014  1:46 PM    Expand All Collapse All   Inpatient RehabilitationTeam Conference and Plan of Care Update Date: 03/21/2014   Time: 10;50 am     Patient Name: Sandy Barrett       Medical Record Number: [id number]  Date of Birth: 15-Jan-1932 Sex: Female         Room/Bed: 4W20C/4W20C-01 Payor Info: Payor: MEDICARE / Plan: MEDICARE PART A AND B / Product Type: *No Product type* /    Admitting Diagnosis: L MCA CVA   Admit Date/Time:  03/13/2014  4:08 PM Admission Comments: No comment available   Primary  Diagnosis:  Cerebral infarction due to embolism of left middle cerebral artery Principal Problem: Cerebral infarction due to embolism of left middle cerebral artery    Patient Active Problem List     Diagnosis  Date Noted   .  Acute pulmonary embolism  03/14/2014   .  DVT of lower extremity, bilateral  03/14/2014   .  Global aphasia  03/14/2014   .  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  Dysphagia due to recent stroke  03/14/2014   .  Aphasia due to stroke  03/13/2014   .  Right hemiparesis  03/13/2014   .  Embolic stroke     .  Cerebral infarction due to embolism of left middle cerebral artery     .  Cerebral infarction due to unspecified mechanism     .  Respiratory failure     .  Cerebral infarction due to thrombosis of right middle cerebral artery     .  Stroke  03/08/2014   .  Pulmonary embolism  03/08/2014   .  CVA (cerebral infarction)     .  Right sided weakness     .  Back pain  08/29/2013   .  Primary localized osteoarthrosis, lower leg  03/06/2013   .  IBS (irritable bowel syndrome)     .  Jugular vein thrombosis  03/01/2011   .  Multinodular goiter  01/13/2011   .  Abnormal chest x-ray  12/12/2010   .  HYPERTHYROIDISM  11/11/2009   .  INSOMNIA  07/03/2009   .  POSTTRAUMATIC STRESS DISORDER  08/22/2008   .  OBESITY  04/21/2008   .  OSTEOARTHRITIS  04/21/2008   .  MIGRAINE HEADACHE  04/20/2008   .  Essential hypertension  04/20/2008     Expected Discharge Date: Expected Discharge Date: 04/03/14  Team Members Present: Physician leading conference: Dr. Alysia Penna Social Worker Present: Ovidio Kin, LCSW Nurse Present: Heather Roberts, RN PT Present: Georjean Mode, PT;Blair [name], PT OT Present: Simonne Come, Dorothyann Gibbs, OT SLP Present: Windell Moulding, SLP PPS Coordinator present : Daiva Nakayama, RN, CRRN        Current Status/Progress  Goal  Weekly Team Focus   Medical     Severe aphasia, severe apraxia, severe right hemiparesis, DVT requiring  anticoagulation  Maintain medical stability during rehabilitation stay  Monitor for signs of bleeding while on dual anticoagulants   Bowel/Bladder     patient is incontinent of bowel and  bladder   to  become continent of bowel and bladder with mod assist   offer timed toileting   Swallow/Nutrition/ Hydration     Dys 1, pudding thick liquids via teaspoon, full supervision and hand over hand assist for swallowing precautions and self feeding   min assist with least restrictive diet    diet toleration    ADL's     total assist bed mobility, +2 for clothing management at bed level and at sit > stand level, +2 transfers.  Pt's cogntive deficits severly impacting her ability to participate in functional tasks   mod assist overall  initiation, Rt attention, transfers, sit <> stand    Mobility     Total A bed mobility, Total to +2A for basic transfers;   Min A bed mobility and w/c mobility; Mod A transfers   Initiation, attention to R visual field/hemi body, functional transfers, management of RLE tone   Communication     total assist for functional communication  max assist   multimodal communication, initiation and cessation of verbal and nonverbal communication   Safety/Cognition/ Behavioral Observations    decreased sustained attention, decreased alertness, poor initiation with perseveratioin   max assist for basic tasks   sustained attention, initiation and cessation during basic,familiar tasks    Pain     patient denies pain  pain less than or equal to 4 on a scale of 0-10  assess pain q4h and offer medication as indicated    Skin     MASD to bilateral butocks, boggy heels   prevent further skin injury/breakdown   keep skin clean and dry, wear prafo boots when in bed.      *See Care Plan and progress notes for long and short-term goals.    Barriers to Discharge:  Heavy assist level, multiple medical comorbidities     Possible Resolutions to Barriers:   Continue rehabilitation program      Discharge Planning/Teaching Needs:   daughter's here daily but feel best option is NHP upon discharge from rehab for longer rehab and more therapies.       Team Discussion:    Sounds less congested-more alert. Improvement but not enough for FIM change. R-inattention and initiation. Severe global aphasia. DYs 1 pudding thick she is hydrating herself on this diet. Medically needs to be here 3 weeks then to NH   Revisions to Treatment Plan:    Probably NH after rehab for more therapies and time to recover    Continued Need for Acute Rehabilitation Level of Care: The patient requires daily medical management by a physician with specialized training in physical medicine and rehabilitation for the following conditions: Daily direction of a multidisciplinary physical rehabilitation program to ensure safe treatment while eliciting the highest outcome that is of practical value to the patient.: Yes Daily medical management of patient stability for increased activity during participation in an intensive rehabilitation regime.: Yes Daily analysis of laboratory values and/or radiology reports with any subsequent need for medication adjustment of medical intervention for : Neurological problems;Other  Elease Hashimoto 03/22/2014, 3:17 PM                  Patient ID: [name], female   DOB: [birth year], 80 y.o.   MRN: [id number]

## 2014-04-05 NOTE — Telephone Encounter (Signed)
Pt was on TCM list d/c 04/05/14 recovering from stroke. Pt was sent to SNF. Did not schedule TCM appt...Sandy Barrett

## 2014-04-05 NOTE — Progress Notes (Signed)
BLE dopplers revealed DVT R-distal CFV, FV, popliteal vein and DVT L-FV and popliteal vein.  2D echo revealed EF 65-70% with interatrial septal aneurysm.   Protein C, Protein S and lupus anticoagulant negative. Surgery consult recommended for work up once patient stable and question of thyroid malignancy.      Subjective/Complaints:     No distress. ROS: cannot obtain due to aphasia Objective: Vital Signs: Blood pressure 126/67, pulse 82, temperature 98.3 F (36.8 C), temperature source Oral, resp. rate 18, height '5\' 5"'  (1.651 m), weight 77.2 kg (170 lb 3.1 oz), SpO2 93 %. Dg Swallowing Func-speech Pathology  04/04/2014    Objective Swallowing Evaluation:    Patient Details  Name: Sandy Barrett MRN: [id number] Date of Birth: 04/02/1932  Today's Date: 04/04/2014 Time: SLP Start Time: 0930 -SLP Stop Time: 1000 SLP Time Calculation (min): 30 min  Past Medical History:  Past Medical History  Diagnosis Date  . HYPERTENSION   . HYPERTHYROIDISM   . OBESITY   . ARTHRITIS   . Posttraumatic stress disorder   . INSOMNIA   . MIGRAINE HEADACHE   . Hyperthyroidism     s/p I-131 ablation 03/2011 of multinod goiter  . Arthritis    Past Surgical History:  Past Surgical History  Procedure Laterality Date  . Abdominal hysterectomy  1976  . Total hip arthroplasty  1998     right  . Tonsillectomy    . Breast surgery      biopsy  . Radiology with anesthesia Left 03/08/2014    Procedure: RADIOLOGY WITH ANESTHESIA;  Surgeon: Rob Hickman, MD;   Location: [facility];  Service: Radiology;  Laterality: Left;   HPI:  HPI: Extubated 3/4. 32 year of female admitted with right sided weakness,  facial droop, and difficulty speaking. CT showed a dense left MCA CVA. CT  chest showed a large thyroid mass as well as bilateral pulmonary emboli.  TPA administered. S/p bilateral common carotid and left vertebral  arteriograms, followed by unsuccessful attempt at revascularization. F/u  MRI with patchy areas of acute left MCA territory  infarction, several  punctate foci of acute right MCA territory infarction, possible trace  subarachnoid hemorrhage in the high right frontal lobe.PMH of HTN,  migraine headache, postraumatic stress disorder.  Initial MBS recommended  pt remain NPO due to cognitive and motor planning deficits, pt initiated  on dys 1 textures, honey thick liquids at bedside 2 days later per results  of MBS and noted clinical improvements in automaticity of oral phase and  swallow response.  Pt downgraded to pudding thick liquids with continued  dys 1 textures due to audible wetness noted with meals and rhonchi.  Pt  upgraded back to honey thick liquids at bedside due to improvements noted  in alertness and management of secretions.  Pt has been tolerating honey  thick liquids via teaspoon with trials of honey thick liquids via cup with  minimal overt s/s of aspiration.  Repeat MBS ordered today to determine  readiness for diet progression.    Assessment / Plan / Recommendation CHL IP CLINICAL IMPRESSIONS 04/04/2014  Dysphagia Diagnosis Moderate pharyngeal phase dysphagia;Moderate oral  phase dysphagia  Clinical impression Pt presents with a moderate oropharyngeal dysphagia  with sensory, motor,and cognitive components.  Pt demonstrates right sided  labial, lingual, and buccal weakness resulting in anterior loss of  boluses, weak lingual manipulation, and prolonged oral transit of boluses  to the oropharynx.  Pt also presents with weak base of tongue resulting in  poor posterior containment of materials in the oral cavity.  The  abovementioned deficits, in addition to decreased pharyngeal sensation,  resulted in premature spillage of materials into the pharynx and delayed  swallow initiation with response triggered most consistently at the level  of the vallecula; however, swallow was delayed to the pyriforms with  larger boluses, thinner consistencies, or if pt became distracted by  environmental stimuli and failed to attend to  boluses.  Delayed swallow  initiation resulted in deep, flash penetration during the swallow with  honey thick liquids via cup sips and silent aspiration during the swallow  with thin liquids.  Nectar thick liquids via straw were silently aspirated  after the swallow due to pharyngeal residuals spilling from the pyriforms  into the airway.  Penetration/Aspiration across all consistencies was  trace in amount.  Pt achieved effective airway protection with portion  control for small amounts of nectar thick liquids via cup.  As a result,  recommend that pt be upgraded to nectar thick liquids with continued dys 1  textures and full supervision for use of swallowing precautions; no  straws, small bites/sips, out of bed for meals, check for right pocketing  and anterior loss of boluses.  Prognosis for advancement good with  continued ST follow up for trials of advanced consistencies and improved  mentation.        CHL IP TREATMENT RECOMMENDATION 03/10/2014  Treatment Plan Recommendations Therapy as outlined in treatment plan below      CHL IP DIET RECOMMENDATION 04/04/2014  Diet Recommendations Dysphagia 1 (Puree);Nectar-thick liquid  Liquid Administration via Cup  Medication Administration Crushed with puree  Compensations Slow rate;Small sips/bites;Check for pocketing;Follow solids  with liquid  Postural Changes and/or Swallow Maneuvers Out of bed for meals;Seated  upright 90 degrees                  SLP Swallow Goals     CHL IP REASON FOR REFERRAL 04/04/2014  Reason for Referral Objectively evaluate swallowing function     CHL IP ORAL PHASE 04/04/2014                    Oral Phase Impaired         Oral - Honey Teaspoon Weak lingual manipulation;Reduced posterior  propulsion;Delayed oral transit        Oral - Nectar Teaspoon Right anterior bolus loss;Lingual pumping;Reduced  posterior propulsion;Holding of bolus;Lingual/palatal residue;Delayed oral  transit  Oral - Nectar Cup Right anterior bolus loss;Lingual pumping;Holding  of  bolus;Lingual/palatal residue;Reduced posterior propulsion;Delayed oral  transit  Oral - Nectar Straw Right anterior bolus loss;Lingual pumping;Holding of  bolus;Lingual/palatal residue;Reduced posterior propulsion;Delayed oral  transit        Oral - Thin Teaspoon Reduced posterior propulsion;Delayed oral  transit;Lingual/palatal residue;Holding of bolus;Lingual pumping;Right  anterior bolus loss  Oral - Thin Cup Reduced posterior propulsion;Delayed oral  transit;Lingual/palatal residue;Holding of bolus;Lingual pumping;Right  anterior bolus loss  Oral - Thin Straw Delayed oral transit;Left pocketing in lateral  sulci;Lingual/palatal residue;Right anterior bolus loss;Lingual  pumping;Holding of bolus     Oral - Puree Weak lingual manipulation;Reduced posterior  propulsion;Delayed oral transit;Right anterior bolus loss;Lingual/palatal  residue;Right pocketing in lateral sulci     Oral - Regular Impaired mastication;Incomplete tongue to palate  contact;Delayed oral transit;Reduced posterior propulsion;Weak lingual  manipulation;Right pocketing in lateral sulci;Lingual/palatal  residue;Right anterior bolus loss               CHL IP PHARYNGEAL PHASE 04/04/2014  Pharyngeal Phase  Impaired              Pharyngeal - Honey Teaspoon Reduced tongue base retraction;Premature  spillage to valleculae;Reduced anterior laryngeal mobility;Reduced  laryngeal elevation;Delayed swallow initiation     Pharyngeal - Honey Cup Reduced laryngeal elevation;Reduced tongue base  retraction;Premature spillage to valleculae;Reduced anterior laryngeal  mobility;Delayed swallow initiation;Premature spillage to pyriform  sinuses;Penetration/Aspiration during swallow  Penetration/Aspiration details (honey cup) Material enters airway,  CONTACTS cords then ejected out        Pharyngeal - Nectar Teaspoon Delayed swallow initiation;Premature spillage  to pyriform sinuses;Reduced tongue base retraction;Premature spillage to  valleculae;Reduced  laryngeal elevation;Reduced anterior laryngeal  mobility;Pharyngeal residue - pyriform sinuses     Pharyngeal - Nectar Cup Delayed swallow initiation;Premature spillage to  pyriform sinuses;Reduced tongue base retraction;Reduced anterior laryngeal  mobility;Reduced laryngeal elevation;Pharyngeal residue - pyriform sinuses      Pharyngeal - Nectar Straw Reduced tongue base retraction;Reduced laryngeal  elevation;Reduced anterior laryngeal mobility;Premature spillage to  pyriform sinuses;Delayed swallow initiation;Premature spillage to  valleculae;Penetration/Aspiration after swallow;Pharyngeal residue -  pyriform sinuses;Reduced airway/laryngeal closure  Penetration/Aspiration details (nectar straw) Material enters airway,  passes BELOW cords without attempt by patient to eject out (silent  aspiration)              Pharyngeal - Thin Teaspoon Delayed swallow initiation;Premature spillage  to pyriform sinuses;Penetration/Aspiration during swallow;Reduced tongue  base retraction;Reduced laryngeal elevation;Reduced anterior laryngeal  mobility;Trace aspiration  Penetration/Aspiration details (thin teaspoon) Material enters airway,  CONTACTS cords and not ejected out  Pharyngeal - Thin Cup Premature spillage to pyriform sinuses;Reduced  tongue base retraction;Reduced laryngeal elevation;Reduced anterior  laryngeal mobility;Pharyngeal residue - pyriform  sinuses;Penetration/Aspiration during swallow  Penetration/Aspiration details (thin cup) Material enters airway, CONTACTS  cords and not ejected out;Material enters airway, passes BELOW cords  without attempt by patient to eject out (silent aspiration)              Pharyngeal - Puree Premature spillage to valleculae;Reduced tongue base  retraction;Reduced laryngeal elevation;Reduced airway/laryngeal  closure;Reduced anterior laryngeal mobility     Pharyngeal - Mechanical Soft Premature spillage to valleculae;Delayed  swallow initiation;Reduced anterior laryngeal  mobility;Reduced laryngeal  elevation;Reduced tongue base retraction                                                                               No flowsheet data found.         Page, Selinda Orion 04/04/2014, 4:18 PM    Results for orders placed or performed during the hospital encounter of 03/13/14 (from the past 72 hour(s))  Protime-INR     Status: Abnormal   Collection Time: 04/03/14  5:59 AM  Result Value Ref Range   Prothrombin Time 26.1 (H) 11.6 - 15.2 seconds   INR 2.37 (H) 0.00 - 1.49  CBC     Status: Abnormal   Collection Time: 04/03/14  5:59 AM  Result Value Ref Range   WBC 7.1 4.0 - 10.5 K/uL   RBC 3.44 (L) 3.87 - 5.11 MIL/uL   Hemoglobin 10.1 (L) 12.0 - 15.0 g/dL   HCT 33.0 (L) 36.0 - 46.0 %   MCV 95.9 78.0 - 100.0 fL   MCH 29.4 26.0 - 34.0 pg  MCHC 30.6 30.0 - 36.0 g/dL   RDW 17.5 (H) 11.5 - 15.5 %   Platelets 693 (H) 150 - 400 K/uL  Protime-INR     Status: Abnormal   Collection Time: 04/04/14  4:37 AM  Result Value Ref Range   Prothrombin Time 22.8 (H) 11.6 - 15.2 seconds   INR 1.99 (H) 0.00 - 4.78  Basic metabolic panel     Status: Abnormal   Collection Time: 04/04/14 10:59 AM  Result Value Ref Range   Sodium 141 135 - 145 mmol/L   Potassium 4.2 3.5 - 5.1 mmol/L   Chloride 106 96 - 112 mmol/L   CO2 28 19 - 32 mmol/L   Glucose, Bld 93 70 - 99 mg/dL   BUN 10 6 - 23 mg/dL   Creatinine, Ser 0.49 (L) 0.50 - 1.10 mg/dL   Calcium 9.4 8.4 - 10.5 mg/dL   GFR calc non Af Amer >90 >90 mL/min   GFR calc Af Amer >90 >90 mL/min    Comment: (NOTE) The eGFR has been calculated using the CKD EPI equation. This calculation has not been validated in all clinical situations. eGFR's persistently <90 mL/min signify possible Chronic Kidney Disease.    Anion gap 7 5 - 15  Protime-INR     Status: Abnormal   Collection Time: 04/05/14  7:24 AM  Result Value Ref Range   Prothrombin Time 24.0 (H) 11.6 - 15.2 seconds   INR 2.13 (H) 0.00 - 1.49  CBC     Status: Abnormal   Collection  Time: 04/05/14  7:24 AM  Result Value Ref Range   WBC 6.2 4.0 - 10.5 K/uL   RBC 3.54 (L) 3.87 - 5.11 MIL/uL   Hemoglobin 10.5 (L) 12.0 - 15.0 g/dL   HCT 33.9 (L) 36.0 - 46.0 %   MCV 95.8 78.0 - 100.0 fL   MCH 29.7 26.0 - 34.0 pg   MCHC 31.0 30.0 - 36.0 g/dL   RDW 17.3 (H) 11.5 - 15.5 %   Platelets 707 (H) 150 - 400 K/uL     HEENT: tongue midline,  neck vein prominence Cardio: RRR and no murmur Resp: CTA B/L and unlabored GI: BS positive and NT< ND Extremity:  Edema LUE> LLE Skin:   Bruise multiple ecchymotic areas BUE Neuro: alert,Unable to close eyes to command  Cranial Nerve Abnormalities RIght central 7, Abnormal Sensory cannot assess due to aphasia, Abnormal Motor 0/5 RUE, 0/5  RLE, antigravity on Left  , Dysarthric, MAS 1-2 in R pec and biceps, wrist/fingers  Aphasic and Apraxic---repeats same phrase/word typically Musc/Skel:  No pain with RUE ROM Gen NAD,    Assessment/Plan: 1. Functional deficits secondary to Large left MCA infarct embolic which require 3+ hours per day of interdisciplinary therapy in a comprehensive inpatient rehab setting. Physiatrist is providing close team supervision and 24 hour management of active medical problems listed below. Physiatrist and rehab team continue to assess barriers to discharge/monitor patient progress toward functional and medical goals.  Reviewed stretching and answered several other questions from daughter. To SNF today. Follow up with dr Letta Pate in about a month  FIM: FIM - Bathing Bathing Steps Patient Completed: Chest, Right Arm, Left upper leg Bathing: 2: Max-Patient completes 3-4 26f10 parts or 25-49%  FIM - Upper Body Dressing/Undressing Upper body dressing/undressing steps patient completed: Thread/unthread left sleeve of pullover shirt/dress Upper body dressing/undressing: 2: Max-Patient completed 25-49% of tasks FIM - Lower Body Dressing/Undressing Lower body dressing/undressing: 1: Total-Patient completed less  than  25% of tasks  FIM - Toileting Toileting: 1: Two helpers  FIM - Radio producer Devices: Recruitment consultant Transfers: 1-Two helpers  FIM - Control and instrumentation engineer Devices: Bed rails, HOB elevated (HOB elevated 15 degrees) Bed/Chair Transfer: 2: Supine > Sit: Max A (lifting assist/Pt. 25-49%), 1: Two helpers  FIM - Locomotion: Wheelchair Distance: 25 Locomotion: Wheelchair: 1: Travels less than 50 ft with moderate assistance (Pt: 50 - 74%) FIM - Locomotion: Ambulation Locomotion: Ambulation: 0: Activity did not occur  Comprehension Comprehension Mode: Auditory Comprehension: 2-Understands basic 25 - 49% of the time/requires cueing 51 - 75% of the time  Expression Expression Mode: Nonverbal Expression Assistive Devices:  (head nods) Expression: 1-Expresses basis less than 25% of the time/requires cueing greater than 75% of the time.  Social Interaction Social Interaction: 2-Interacts appropriately 25 - 49% of time - Needs frequent redirection.  Problem Solving Problem Solving: 2-Solves basic 25 - 49% of the time - needs direction more than half the time to initiate, plan or complete simple activities  Memory Memory Mode: Not assessed (unable to assess because of aphasia) Memory: 1-Recognizes or recalls less than 25% of the time/requires cueing greater than 75% of the time  Medical Problem List and Plan: 1. Functional deficits secondary to Large L-MCA infarct with dense right hemiplegia, dysphagia and global aphasia.   2.  DVT Prophylaxis/Anticoagulation: Pharmaceutical  Coumadin for DVT 3. Pain Management: tramadol, tylenol, appropriate turning in bed, regular checks for comfort 4. Mood: LCSW to follow for evaluation and support.   5. Neuropsych: This patient is not capable of making decisions on her own behalf. 6. Skin/Wound Care:  Turn patient every 4 hours.  Rehab RN to monitor skin daily.  Maintain adequate  nutrition and hydration status.   7. Fluids/Electrolytes/Nutrition:  Monitor I/O.Off IVF   -bmet normal  -encouraging po, full supervision with feeding 8. Hypertension: normotensive at present 9.  Thyroid mass question carcinoma, further eval as pt recovers from severe CVA 10.  Hx bilateral knee OA, saw ortho as outpt, diclofenac gel 11.  Thrombocytopenia stable- bruising but no obvious hematoma formation 12.  ecoli UTI, sens to keflex --treated LOS (Days) 23 A FACE TO FACE EVALUATION WAS PERFORMED  SWARTZ,ZACHARY T 04/05/2014, 9:48 AM

## 2014-04-05 NOTE — Plan of Care (Signed)
Problem: RH Awareness Goal: LTG: Patient will demonstrate intellectual/emergent (PT) LTG: Patient will demonstrate intellectual/emergent/anticipatory awareness with assist during a mobility activity (PT)  Outcome: Not Met (add Reason) Pt requires total A for emergent awareness of impairments.

## 2014-04-05 NOTE — Progress Notes (Signed)
Social Work Discharge Note Discharge Note  The overall goal for the admission was met for:   Discharge location: Yes-[facility]  Length of Stay: Yes-22 DAYS  Discharge activity level: Yes-MIN/MOD LEVEL  Home/community participation: Yes  Services provided included: MD, RD, PT, OT, SLP, RN, CM, TR, Pharmacy and SW  Financial Services: Medicare and Private Insurance: [employer]  Follow-up services arranged: Other: NHP  Comments (or additional information):PT NEEDS MORE REHAB AND MORE TIME TO RECOVER BEFORE GOING HOME=HOPEFULLY  Patient/Family verbalized understanding of follow-up arrangements: Yes  Individual responsible for coordination of the follow-up plan: DAUGHTERS-TERESA & CAMILLA  Confirmed correct DME delivered: Sandy Barrett 04/05/2014    Sandy Barrett

## 2014-04-05 NOTE — Patient Care Conference (Signed)
Inpatient RehabilitationTeam Conference and Plan of Care Update Date: 04/04/2014   Time: 1;15 PM    Patient Name: Sandy Barrett      Medical Record Number: [id number]  Date of Birth: [birth year] Sex: Female         Room/Bed: 4W20C/4W20C-01 Payor Info: Payor: MEDICARE / Plan: MEDICARE PART A AND B / Product Type: *No Product type* /    Admitting Diagnosis: L MCA CVA  Admit Date/Time:  03/13/2014  4:08 PM Admission Comments: No comment available   Primary Diagnosis:  Cerebral infarction due to embolism of left middle cerebral artery Principal Problem: Cerebral infarction due to embolism of left middle cerebral artery  Patient Active Problem List   Diagnosis Date Noted  . Acute pulmonary embolism 03/14/2014  . DVT of lower extremity, bilateral 03/14/2014  . Global aphasia 03/14/2014  . 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 Dysphagia due to recent stroke 03/14/2014  . Aphasia due to stroke 03/13/2014  . Right hemiparesis 03/13/2014  . Embolic stroke   . Cerebral infarction due to embolism of left middle cerebral artery   . Cerebral infarction due to unspecified mechanism   . Respiratory failure   . Cerebral infarction due to thrombosis of right middle cerebral artery   . Stroke 03/08/2014  . Pulmonary embolism 03/08/2014  . CVA (cerebral infarction)   . Right sided weakness   . Back pain 08/29/2013  . Primary localized osteoarthrosis, lower leg 03/06/2013  . IBS (irritable bowel syndrome)   . 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 Multinodular goiter 01/13/2011  . Abnormal chest x-ray 12/12/2010  . HYPERTHYROIDISM 11/11/2009  . INSOMNIA 07/03/2009  . POSTTRAUMATIC STRESS DISORDER 08/22/2008  . OBESITY 04/21/2008  . OSTEOARTHRITIS 04/21/2008  . MIGRAINE HEADACHE 04/20/2008  . Essential hypertension 04/20/2008    Expected Discharge Date: Expected Discharge Date: 04/05/14  Team Members Present: Physician leading conference: Dr. Alger Simons Social Worker Present: Ovidio Kin, LCSW Nurse Present: Elliot Cousin, RN PT Present: Raylene Everts, PT;Blair [name], PT OT Present: Simonne Come, OT SLP Present: Windell Moulding, SLP PPS Coordinator present : Daiva Nakayama, RN, CRRN     Current Status/Progress Goal Weekly Team Focus  Medical   improved swallow. still with severe aphasia. spastic right hemiparesis  rom, spsticity mgt,   nutrition/swallowing   Bowel/Bladder     incont B & B   timed tolieting-to improve continence   timed tolieting  Swallow/Nutrition/ Hydration   Dys 1, nectar thick liquids, full supervision for rate/portion control  min assist with least restrictive diet   toleration of nectar thick liquids prior to discharge    ADL's   max assist self-care tasks at bed level, +2 with transfers.  Pt is demonstrating increased initiation with very basic functional tasks, continues to require hand over hand to initiate and carry through with tasks. Pt is demonstrating increased awareness of RUE and attention to Rt.  max assist overall  initiation, Rt attention, transfers, sit <> stand   Mobility   Max A bed mobility, Total A to +2A transfers, Min A dynamic sitting balance  Min A w/c mobility; Mod A bed mobility and transfers  Management of RUE during mobility, initiation, bed mobility, dynamic sitting balance, functional transfers, w/c mobility, D/C planning   Communication   total assist for functional communication  max assist   multimodal communication, initiation and cessation of verbal and nonverbal communication attempts, automatics    Safety/Cognition/ Behavioral Observations  mod-max assist for sustained attention, initiation, perseveration, intellectual awareness of  errors   max assist for basic tasks   sustained attention, initiation, and cessation, during basic, familiar tasks    Pain     no pain complaints-monitor        Skin     prafo boots when in bed-protect heels   monitor skin q shift        *See Care Plan and progress notes for long  and short-term goals.  Barriers to Discharge: profound deficits which require substantial assist    Possible Resolutions to Barriers:  supervision further nursing care after discharge from here    Discharge Planning/Teaching Needs:  NH bed for tomorrow-daughter's and pt agreeable plan for transfer tomorrow      Team Discussion:  Did better on MBS-upgraded to nectar thick liquids. Iniates more in therapies,labs look good-medically ready for transfer to NH. Has started to propel wheelchair in therapies  Revisions to Treatment Plan:  NH bed 3/31   Continued Need for Acute Rehabilitation Level of Care: The patient requires daily medical management by a physician with specialized training in physical medicine and rehabilitation for the following conditions: Daily direction of a multidisciplinary physical rehabilitation program to ensure safe treatment while eliciting the highest outcome that is of practical value to the patient.: Yes Daily medical management of patient stability for increased activity during participation in an intensive rehabilitation regime.: Yes Daily analysis of laboratory values and/or radiology reports with any subsequent need for medication adjustment of medical intervention for : Neurological problems;Other;Cardiac problems  Elease Hashimoto 04/05/2014, 12:39 PM

## 2014-04-05 NOTE — Progress Notes (Signed)
Speech Language Pathology Note  Patient Details  Name: Tayona Sarnowski MRN: [id number] Date of Birth: August 24, 1932 Today's Date: 04/05/2014  Addendum made to yesterday's MBS after further review of study.  Please refer to procedure note and disregard report located under imaging.  Thank you.     Nakisha Chai, Selinda Orion 04/05/2014, 11:18 AM

## 2014-04-05 NOTE — Procedures (Addendum)
Objective Swallowing Evaluation:    Patient Details  Name: Sandy Barrett MRN: [id number] Date of Birth: Feb 02, 1932  Past Medical History:  Past Medical History  Diagnosis Date  . HYPERTENSION   . HYPERTHYROIDISM   . OBESITY   . ARTHRITIS   . Posttraumatic stress disorder   . INSOMNIA   . MIGRAINE HEADACHE   . Hyperthyroidism     s/p I-131 ablation 03/2011 of multinod goiter  . Arthritis    Past Surgical History:  Past Surgical History  Procedure Laterality Date  . Abdominal hysterectomy  1976  . Total hip arthroplasty  1998     right  . Tonsillectomy    . Breast surgery      biopsy  . Radiology with anesthesia Left 03/08/2014    Procedure: RADIOLOGY WITH ANESTHESIA;  Surgeon: Rob Hickman, MD;  Location: [facility];  Service: Radiology;  Laterality: Left;   HPI:  Extubated 3/4. 86 year of female admitted with right sided weakness, facial droop, and difficulty speaking. CT showed a dense left MCA CVA. CT chest showed a large thyroid mass as well as bilateral pulmonary emboli. TPA administered. S/p bilateral common carotid and left vertebral arteriograms, followed by unsuccessful attempt at revascularization. F/u MRI with patchy areas of acute left MCA territory infarction, several punctate foci of acute right MCA territory infarction, possible trace subarachnoid hemorrhage in the high right frontal lobe.PMH of HTN, migraine headache, postraumatic stress disorder.      Recommendation/Prognosis  Clinical Impression:   Dysphagia Diagnosis: Moderate pharyngeal phase dysphagia Clinical impression: Pt presents with a moderate oropharyngeal dysphagia with sensory, motor,and cognitive components.  Pt demonstrated right sided labial, lingual, and buccal weakness resulting in anterior loss of boluses, weak lingual manipulation, and prolonged oral transit of boluses to the oropharynx.  Pt also presents with weak base of tongue resulting in poor posterior containment of materials in  the oral cavity.  The abovementioned deficits, in addition to decreased pharyngeal sensation, resulted in premature spillage of materials into the pharynx and delayed swallow initiation with response triggered most consistently at the level of the vallecula; however, swallow was delayed to the pyriforms with larger boluses, thinner consistencies, or if pt became distracted by environmental stimuli and failed to attend to boluses.  Furthermore, larger boluses resulted in greater amounts of pharyngeal residue which increases her risk of aspiration of post swallow residuals.    Delayed swallow initiation resulted in deep, flash penetration x1 during swallow with honey thick liquids via cup sips and flash penetration during the swallow with thin liquids. No significant difference was noted in airway protection between honey thick liquids and nectar thick liquids and pt achieved effective airway protection with portion control for small amounts of nectar thick liquids via cup.  As a result, recommend that pt be upgraded to nectar thick liquids with continued dys 1 textures and full supervision for use of swallowing precautions; no straws, small bites/sips, out of bed for meals, check for right pocketing and anterior loss of boluses.  Prognosis for advancement good with continued ST follow up for trials of advanced consistencies and improved mentation.    Swallow Evaluation Recommendations:  Diet Recommendations: Dysphagia 1 (Puree);Nectar-thick liquid Liquid Administration via: Cup Medication Administration: Crushed with puree Supervision: Full supervision/cueing for compensatory strategies;Staff to assist with self feeding Compensations: Slow rate;Small sips/bites;Check for pocketing;Follow solids with liquid Postural Changes and/or Swallow Maneuvers: Out of bed for meals;Seated upright 90 degrees Oral Care Recommendations: Oral care BID  Individuals Consulted: Consulted and Agree with Results and  Recommendations: Family member/caregiver Family Member Consulted: daughter [name]       SLP Assessment/Plan  Plan:   See plan of care.       General: Date of Onset: 03/08/14 Reason for Referral: Objectively evaluate swallowing function Previous Swallow Assessment: BSE completed on 03/14/14; MBSS completed on 03/16/14 recommending dys 1 textures, pudding thick liquids; upgraded to honey thick liquids at bedside  Respiratory Status: Room air History of Recent Intubation: Yes Length of Intubations (days): 1 days Date extubated: 03/09/14 Behavior/Cognition: Alert;Impulsive;Requires cueing;Cooperative Oral Cavity - Dentition: Dentures, top;Missing dentition Oral Motor / Sensory Function: Impaired - see Bedside swallow eval Baseline Vocal Quality: Other (comment) (nonverbal ) Volitional Cough: Cognitively unable to elicit Anatomy: Within functional limits Pharyngeal Secretions: Not observed secondary MBS   Reason for Referral:   Objectively evaluate swallowing function    Oral Phase: Oral Preparation/Oral Phase Oral Phase: Impaired Oral - Honey Oral - Honey Teaspoon: Weak lingual manipulation;Delayed oral transit;Reduced posterior propulsion Oral - Nectar Oral - Nectar Teaspoon: Right anterior bolus loss;Lingual pumping;Reduced posterior propulsion;Lingual/palatal residue;Delayed oral transit Oral - Nectar Cup: Right anterior bolus loss;Lingual pumping;Lingual/palatal residue;Reduced posterior propulsion;Delayed oral transit Oral - Nectar Straw: Right anterior bolus loss;Lingual pumping;Lingual/palatal residue;Reduced posterior propulsion;Delayed oral transit Oral - Thin Oral - Thin Teaspoon: Delayed oral transit;Right anterior bolus loss;Lingual pumping;Lingual/palatal residue;Reduced posterior propulsion Oral - Thin Cup: Right anterior bolus loss;Lingual pumping;Lingual/palatal residue;Reduced posterior propulsion;Delayed oral transit Oral - Thin Straw: Delayed oral transit;Reduced  posterior propulsion;Lingual/palatal residue;Right anterior bolus loss;Lingual pumping Oral - Solids Oral - Puree: Weak lingual manipulation;Reduced posterior propulsion;Delayed oral transit;Right anterior bolus loss;Lingual/palatal residue Oral - Regular: Right anterior bolus loss;Lingual/palatal residue;Weak lingual manipulation;Reduced posterior propulsion;Delayed oral transit   Pharyngeal Phase:  Pharyngeal - Honey Pharyngeal - Honey Teaspoon: Reduced tongue base retraction;Premature spillage to valleculae;Reduced anterior laryngeal mobility;Reduced laryngeal elevation;Delayed swallow initiation Pharyngeal - Honey Cup: Reduced laryngeal elevation;Reduced tongue base retraction;Premature spillage to valleculae;Reduced anterior laryngeal mobility;Delayed swallow initiation;Premature spillage to pyriform sinuses;Penetration/Aspiration during swallow Penetration/Aspiration details (honey cup): Material enters airway, CONTACTS cords then ejected out Pharyngeal - Nectar Pharyngeal - Nectar Teaspoon: Delayed swallow initiation;Premature spillage to pyriform sinuses;Reduced tongue base retraction;Premature spillage to valleculae;Reduced laryngeal elevation;Reduced anterior laryngeal mobility;Pharyngeal residue - pyriform sinuses Pharyngeal - Nectar Cup: Delayed swallow initiation;Premature spillage to pyriform sinuses;Reduced tongue base retraction;Reduced anterior laryngeal mobility;Reduced laryngeal elevation;Pharyngeal residue - pyriform sinuses Pharyngeal - Nectar Straw: Reduced tongue base retraction;Reduced laryngeal elevation;Reduced anterior laryngeal mobility;Premature spillage to pyriform sinuses;Delayed swallow initiation;Premature spillage to valleculae;Pharyngeal residue - pyriform sinuses;Reduced airway/laryngeal closure Pharyngeal - Thin Pharyngeal - Thin Teaspoon: Delayed swallow initiation;Premature spillage to pyriform sinuses;Reduced tongue base retraction;Reduced laryngeal  elevation;Reduced anterior laryngeal mobility Pharyngeal - Thin Cup: Premature spillage to pyriform sinuses;Reduced tongue base retraction;Reduced laryngeal elevation;Reduced anterior laryngeal mobility;Pharyngeal residue - pyriform sinuses;Penetration/Aspiration during swallow Penetration/Aspiration details (thin cup): Material enters airway, remains ABOVE vocal cords and ejected out Pharyngeal - Solids Pharyngeal - Puree: Premature spillage to valleculae;Reduced tongue base retraction;Reduced laryngeal elevation;Reduced airway/laryngeal closure;Reduced anterior laryngeal mobility Pharyngeal - Mechanical Soft: Premature spillage to valleculae;Delayed swallow initiation;Reduced anterior laryngeal mobility;Reduced laryngeal elevation;Reduced tongue base retraction              Windell Moulding L 04/05/2014, 11:15 AM

## 2014-04-09 ENCOUNTER — Non-Acute Institutional Stay (SKILLED_NURSING_FACILITY): Payer: Medicare Other | Admitting: Adult Health

## 2014-04-09 ENCOUNTER — Encounter: Payer: Self-pay | Admitting: Adult Health

## 2014-04-09 DIAGNOSIS — E042 Nontoxic multinodular goiter: Secondary | ICD-10-CM

## 2014-04-09 DIAGNOSIS — I10 Essential (primary) hypertension: Secondary | ICD-10-CM | POA: Diagnosis not present

## 2014-04-09 DIAGNOSIS — M150 Primary generalized (osteo)arthritis: Secondary | ICD-10-CM | POA: Diagnosis not present

## 2014-04-09 DIAGNOSIS — D509 Iron deficiency anemia, unspecified: Secondary | ICD-10-CM

## 2014-04-09 DIAGNOSIS — B079 Viral wart, unspecified: Secondary | ICD-10-CM | POA: Diagnosis not present

## 2014-04-09 DIAGNOSIS — M8949 Other hypertrophic osteoarthropathy, multiple sites: Secondary | ICD-10-CM

## 2014-04-09 DIAGNOSIS — K5900 Constipation, unspecified: Secondary | ICD-10-CM | POA: Diagnosis not present

## 2014-04-09 DIAGNOSIS — I82403 Acute embolism and thrombosis of unspecified deep veins of lower extremity, bilateral: Secondary | ICD-10-CM

## 2014-04-09 DIAGNOSIS — G4700 Insomnia, unspecified: Secondary | ICD-10-CM

## 2014-04-09 DIAGNOSIS — I63412 Cerebral infarction due to embolism of left middle cerebral artery: Secondary | ICD-10-CM | POA: Diagnosis not present

## 2014-04-09 DIAGNOSIS — M159 Polyosteoarthritis, unspecified: Secondary | ICD-10-CM

## 2014-04-09 DIAGNOSIS — I2699 Other pulmonary embolism without acute cor pulmonale: Secondary | ICD-10-CM | POA: Diagnosis not present

## 2014-04-09 DIAGNOSIS — B351 Tinea unguium: Secondary | ICD-10-CM | POA: Diagnosis not present

## 2014-04-09 NOTE — Progress Notes (Signed)
Patient ID: Sandy Barrett, female   DOB: 1932-08-17, 81 y.o.   MRN: [id number]   04/09/2014  Facility:  [facility] [place]  [place] Room Number: 1206-P LEVEL OF CARE:  SNF (31)   Chief Complaint  Patient presents with  . Hospitalization Follow-up    Cerebral infarction, hypertension, DVTs, PE, anemia, osteoarthritis, wart, onychomycosis, constipation and insomnia    HISTORY OF PRESENT ILLNESS:  This is a 81 year old female who has been admitted to [facility] on 04/05/14 from [facility]. She has PMH of hypertension, hyperlipidemia, hyper thyroid disease in, osteoarthritis and migraine. She presented to [facility] with speech difficulty, right sided weakness and facial droop. She was then transferred to [facility] for stroke workup. CT showed left MCA sign. She is aphasic, has right hemiparesis and right hemianopsia. CT chest showed a large thyroid mass and bilateral PE. tPA was administered. Minimal improvement. She was also found to have bilateral lower extremity DVT for which she was started on heparin drip and switched to Coumadin.  He has been admitted for a short-term rehabilitation.  PAST MEDICAL HISTORY:  Past Medical History  Diagnosis Date  . HYPERTENSION   . HYPERTHYROIDISM   . OBESITY   . ARTHRITIS   . Posttraumatic stress disorder   . INSOMNIA   . MIGRAINE HEADACHE   . Hyperthyroidism     s/p I-131 ablation 03/2011 of multinod goiter  . Arthritis     CURRENT MEDICATIONS: Reviewed per MAR/see medication list  No Known Allergies   REVIEW OF SYSTEMS:  GENERAL: no change in appetite, no fatigue, no weight changes, no fever, chills or weakness RESPIRATORY: no cough, SOB, DOE, wheezing, hemoptysis CARDIAC: no chest pain, edema or palpitations GI: no abdominal pain, diarrhea, constipation, heart burn, nausea or vomiting  PHYSICAL EXAMINATION  GENERAL: no acute distress, normal body habitus SKIN:   Right buttock pressure sores EYES: conjunctivae normal, sclerae normal, normal eye lids NECK: supple, trachea midline, no neck masses, no thyroid tenderness, no thyromegaly LYMPHATICS: no LAN in the neck, no supraclavicular LAN RESPIRATORY: breathing is even & unlabored, BS CTAB CARDIAC: RRR, no murmur,no extra heart sounds, no edema GI: abdomen soft, normal BS, no masses, no tenderness, no hepatomegaly, no splenomegaly EXTREMITIES:  Right sided weakness; able to move LUE and LLE; left big toenail thick and white; left 5th toe has wart NEURO:  Aphasic; right sided weakness PSYCHIATRIC: the patient is alert & oriented to person, affect & behavior appropriate  LABS/RADIOLOGY: Labs reviewed: Basic Metabolic Panel:  Recent Labs  03/09/14 0450 03/10/14 0219  03/21/14 0705 03/28/14 1010 04/04/14 1059  NA 143  142 144  < > 142 142 141  K 3.1*  3.0* 2.8*  < > 3.9 4.0 4.2  CL 108  109 110  < > 109 108 106  CO2 25  26 26   < > 25 26 28   GLUCOSE 134*  137* 118*  < > 89 80 93  BUN 8  7 7   < > 8 8 10   CREATININE 0.55  0.53 0.57  < > 0.40* 0.45* 0.49*  CALCIUM 8.3*  8.2* 8.1*  < > 8.9 9.5 9.4  MG 1.8 1.9  --   --   --   --   PHOS 4.2  4.2  --   --   --   --   --   < > = values in this interval not displayed. Liver Function Tests:  Recent  Labs  08/07/13 1059 03/08/14 1448 03/09/14 0450 03/14/14 0423  AST 15 50*  --  17  ALT 7 18  --  10  ALKPHOS 59 63  --  48  BILITOT 0.7 1.8*  --  1.3*  PROT 6.8 6.4  --  4.6*  ALBUMIN 3.6 3.3* 2.5* 2.4*   CBC:  Recent Labs  08/07/13 1059 03/08/14 1448  03/14/14 0423  03/31/14 0845 04/03/14 0559 04/05/14 0724  WBC 6.4 8.7  < > 8.6  < > 7.4 7.1 6.2  NEUTROABS 4.7 6.6  --  6.7  --   --   --   --   HGB 12.8 12.9  < > 8.8*  < > 10.6* 10.1* 10.5*  HCT 39.1 40.4  < > 27.8*  < > 35.3* 33.0* 33.9*  MCV 92.7 94.6  < > 94.2  < > 97.2 95.9 95.8  PLT 540.0* 409*  < > 509*  < > 618* 693* 707*  < > = values in this interval not  displayed.  Lipid Panel:  Recent Labs  08/07/13 1059 03/09/14 0450  HDL 51.20 31*   CBG:  Recent Labs  03/13/14 1621 03/13/14 2111 03/14/14 0648  GLUCAP 96 108* 116*     Ct Head Wo Contrast  03/15/2014   CLINICAL DATA:  Stroke follow-up.  EXAM: CT HEAD WITHOUT CONTRAST  TECHNIQUE: Contiguous axial images were obtained from the base of the skull through the vertex without intravenous contrast.  COMPARISON:  03/11/2014  FINDINGS: An evolving left MCA territory infarct is again seen with greatest involvement of the frontal lobe, insula, and basal ganglia. Cytotoxic edema has slightly increased from the prior study. There is no significant mass effect. A tiny focus of subarachnoid hemorrhage is again seen in the right central sulcus near the vertex. There is also a small focus of subarachnoid hemorrhage in the left central sulcus, not clearly present on the prior study. No acute parenchymal hemorrhage is identified. There is no hydrocephalus. No extra-axial fluid collection is present.  Orbits are unremarkable. Mastoid air cells are clear. Small left maxillary sinus mucous retention cyst is noted.  IMPRESSION: 1. Evolving left MCA territory infarct with slightly increased edema. No midline shift. 2. Trace subarachnoid hemorrhage with a new, tiny focus in the left central sulcus.   Electronically Signed   By: Logan Bores   On: 03/15/2014 08:09   Ct Head Wo Contrast  03/11/2014   CLINICAL DATA:  Follow-up examination for acute stroke, status post unsuccessful attempt at revascularization.  EXAM: CT HEAD WITHOUT CONTRAST  TECHNIQUE: Contiguous axial images were obtained from the base of the skull through the vertex without intravenous contrast.  COMPARISON:  Prior MRI from 03/09/2014.  FINDINGS: Diffuse hypodensity with blurring of the gray-white matter differentiation seen throughout the left MCA territory, compatible with evolving acute ischemic left MCA territory infarct. There is involvement of  the left basal ganglia. Overall, distribution is similar relative to prior MRI, although there is increased cytotoxic edema. There is partial effacement of the left lateral ventricle due to edema without midline shift. No hydrocephalus. There is partial effacement of the left perimesencephalic cistern. Basilar cisterns otherwise patent. No hemorrhagic transformation within the left MCA territory infarct. Left M1 segment remains dense.  Previously identified small are punctate hit infarcts within the right cerebral hemisphere not well visualized. There is a probable small focus of subarachnoid hemorrhage along the central sulcus of the right cerebral hemisphere (series 201, image 25, stable from prior  MRI. No other intracranial hemorrhage.  No new infarct.  No extra-axial fluid collection.  Calvarium intact.  No acute abnormality about the orbits.  Polypoid opacity present within the inferior left maxillary sinus. Mucoperiosteal thickening present within the ethmoidal air cells. Mastoid air cells clear.  IMPRESSION: 1. Interval evolution of acute left MCA territory infarct with increased edema as compared to prior MRI from 03/09/2014. There is mild mass effect on the left lateral ventricle without midline shift. No hemorrhagic transformation. 2. Small focus of probable subarachnoid hemorrhage along the central sulcus of the right cerebral hemisphere, stable from previous MRI. No new hemorrhage. 3. No new infarct or other acute abnormality.   Electronically Signed   By: Jeannine Boga M.D.   On: 03/11/2014 06:21   Dg Swallowing Func-speech Pathology  04/04/2014    Objective Swallowing Evaluation:    Patient Details  Name: Timya Trimmer MRN: [id number] Date of Birth: 05/26/1932  Today's Date: 04/04/2014 Time: SLP Start Time: 0930 -SLP Stop Time: 1000 SLP Time Calculation (min): 30 min  Past Medical History:  Past Medical History  Diagnosis Date  . HYPERTENSION   . HYPERTHYROIDISM   . OBESITY   . ARTHRITIS   .  Posttraumatic stress disorder   . INSOMNIA   . MIGRAINE HEADACHE   . Hyperthyroidism     s/p I-131 ablation 03/2011 of multinod goiter  . Arthritis    Past Surgical History:  Past Surgical History  Procedure Laterality Date  . Abdominal hysterectomy  1976  . Total hip arthroplasty  1998     right  . Tonsillectomy    . Breast surgery      biopsy  . Radiology with anesthesia Left 03/08/2014    Procedure: RADIOLOGY WITH ANESTHESIA;  Surgeon: Rob Hickman, MD;   Location: [place];  Service: Radiology;  Laterality: Left;   HPI:  HPI: Extubated 3/4. 62 year of female admitted with right sided weakness,  facial droop, and difficulty speaking. CT showed a dense left MCA CVA. CT  chest showed a large thyroid mass as well as bilateral pulmonary emboli.  TPA administered. S/p bilateral common carotid and left vertebral  arteriograms, followed by unsuccessful attempt at revascularization. F/u  MRI with patchy areas of acute left MCA territory infarction, several  punctate foci of acute right MCA territory infarction, possible trace  subarachnoid hemorrhage in the high right frontal lobe.PMH of HTN,  migraine headache, postraumatic stress disorder.  Initial MBS recommended  pt remain NPO due to cognitive and motor planning deficits, pt initiated  on dys 1 textures, honey thick liquids at bedside 2 days later per results  of MBS and noted clinical improvements in automaticity of oral phase and  swallow response.  Pt downgraded to pudding thick liquids with continued  dys 1 textures due to audible wetness noted with meals and rhonchi.  Pt  upgraded back to honey thick liquids at bedside due to improvements noted  in alertness and management of secretions.  Pt has been tolerating honey  thick liquids via teaspoon with trials of honey thick liquids via cup with  minimal overt s/s of aspiration.  Repeat MBS ordered today to determine  readiness for diet progression.    Assessment / Plan / Recommendation CHL IP CLINICAL IMPRESSIONS  04/04/2014  Dysphagia Diagnosis Moderate pharyngeal phase dysphagia;Moderate oral  phase dysphagia  Clinical impression Pt presents with a moderate oropharyngeal dysphagia  with sensory, motor,and cognitive components.  Pt demonstrates right sided  labial, lingual, and buccal weakness  resulting in anterior loss of  boluses, weak lingual manipulation, and prolonged oral transit of boluses  to the oropharynx.  Pt also presents with weak base of tongue resulting in  poor posterior containment of materials in the oral cavity.  The  abovementioned deficits, in addition to decreased pharyngeal sensation,  resulted in premature spillage of materials into the pharynx and delayed  swallow initiation with response triggered most consistently at the level  of the vallecula; however, swallow was delayed to the pyriforms with  larger boluses, thinner consistencies, or if pt became distracted by  environmental stimuli and failed to attend to boluses.  Delayed swallow  initiation resulted in deep, flash penetration during the swallow with  honey thick liquids via cup sips and silent aspiration during the swallow  with thin liquids.  Nectar thick liquids via straw were silently aspirated  after the swallow due to pharyngeal residuals spilling from the pyriforms  into the airway.  Penetration/Aspiration across all consistencies was  trace in amount.  Pt achieved effective airway protection with portion  control for small amounts of nectar thick liquids via cup.  As a result,  recommend that pt be upgraded to nectar thick liquids with continued dys 1  textures and full supervision for use of swallowing precautions; no  straws, small bites/sips, out of bed for meals, check for right pocketing  and anterior loss of boluses.  Prognosis for advancement good with  continued ST follow up for trials of advanced consistencies and improved  mentation.        CHL IP TREATMENT RECOMMENDATION 03/10/2014  Treatment Plan Recommendations Therapy as  outlined in treatment plan below      CHL IP DIET RECOMMENDATION 04/04/2014  Diet Recommendations Dysphagia 1 (Puree);Nectar-thick liquid  Liquid Administration via Cup  Medication Administration Crushed with puree  Compensations Slow rate;Small sips/bites;Check for pocketing;Follow solids  with liquid  Postural Changes and/or Swallow Maneuvers Out of bed for meals;Seated  upright 90 degrees                  SLP Swallow Goals     CHL IP REASON FOR REFERRAL 04/04/2014  Reason for Referral Objectively evaluate swallowing function     CHL IP ORAL PHASE 04/04/2014                    Oral Phase Impaired         Oral - Honey Teaspoon Weak lingual manipulation;Reduced posterior  propulsion;Delayed oral transit        Oral - Nectar Teaspoon Right anterior bolus loss;Lingual pumping;Reduced  posterior propulsion;Holding of bolus;Lingual/palatal residue;Delayed oral  transit  Oral - Nectar Cup Right anterior bolus loss;Lingual pumping;Holding of  bolus;Lingual/palatal residue;Reduced posterior propulsion;Delayed oral  transit  Oral - Nectar Straw Right anterior bolus loss;Lingual pumping;Holding of  bolus;Lingual/palatal residue;Reduced posterior propulsion;Delayed oral  transit        Oral - Thin Teaspoon Reduced posterior propulsion;Delayed oral  transit;Lingual/palatal residue;Holding of bolus;Lingual pumping;Right  anterior bolus loss  Oral - Thin Cup Reduced posterior propulsion;Delayed oral  transit;Lingual/palatal residue;Holding of bolus;Lingual pumping;Right  anterior bolus loss  Oral - Thin Straw Delayed oral transit;Left pocketing in lateral  sulci;Lingual/palatal residue;Right anterior bolus loss;Lingual  pumping;Holding of bolus     Oral - Puree Weak lingual manipulation;Reduced posterior  propulsion;Delayed oral transit;Right anterior bolus loss;Lingual/palatal  residue;Right pocketing in lateral sulci     Oral - Regular Impaired mastication;Incomplete tongue to palate  contact;Delayed oral transit;Reduced  posterior propulsion;Weak lingual  manipulation;Right pocketing in lateral sulci;Lingual/palatal  residue;Right anterior bolus loss               CHL IP PHARYNGEAL PHASE 04/04/2014  Pharyngeal Phase Impaired              Pharyngeal - Honey Teaspoon Reduced tongue base retraction;Premature  spillage to valleculae;Reduced anterior laryngeal mobility;Reduced  laryngeal elevation;Delayed swallow initiation     Pharyngeal - Honey Cup Reduced laryngeal elevation;Reduced tongue base  retraction;Premature spillage to valleculae;Reduced anterior laryngeal  mobility;Delayed swallow initiation;Premature spillage to pyriform  sinuses;Penetration/Aspiration during swallow  Penetration/Aspiration details (honey cup) Material enters airway,  CONTACTS cords then ejected out        Pharyngeal - Nectar Teaspoon Delayed swallow initiation;Premature spillage  to pyriform sinuses;Reduced tongue base retraction;Premature spillage to  valleculae;Reduced laryngeal elevation;Reduced anterior laryngeal  mobility;Pharyngeal residue - pyriform sinuses     Pharyngeal - Nectar Cup Delayed swallow initiation;Premature spillage to  pyriform sinuses;Reduced tongue base retraction;Reduced anterior laryngeal  mobility;Reduced laryngeal elevation;Pharyngeal residue - pyriform sinuses      Pharyngeal - Nectar Straw Reduced tongue base retraction;Reduced laryngeal  elevation;Reduced anterior laryngeal mobility;Premature spillage to  pyriform sinuses;Delayed swallow initiation;Premature spillage to  valleculae;Penetration/Aspiration after swallow;Pharyngeal residue -  pyriform sinuses;Reduced airway/laryngeal closure  Penetration/Aspiration details (nectar straw) Material enters airway,  passes BELOW cords without attempt by patient to eject out (silent  aspiration)              Pharyngeal - Thin Teaspoon Delayed swallow initiation;Premature spillage  to pyriform sinuses;Penetration/Aspiration during swallow;Reduced tongue  base retraction;Reduced  laryngeal elevation;Reduced anterior laryngeal  mobility;Trace aspiration  Penetration/Aspiration details (thin teaspoon) Material enters airway,  CONTACTS cords and not ejected out  Pharyngeal - Thin Cup Premature spillage to pyriform sinuses;Reduced  tongue base retraction;Reduced laryngeal elevation;Reduced anterior  laryngeal mobility;Pharyngeal residue - pyriform  sinuses;Penetration/Aspiration during swallow  Penetration/Aspiration details (thin cup) Material enters airway, CONTACTS  cords and not ejected out;Material enters airway, passes BELOW cords  without attempt by patient to eject out (silent aspiration)              Pharyngeal - Puree Premature spillage to valleculae;Reduced tongue base  retraction;Reduced laryngeal elevation;Reduced airway/laryngeal  closure;Reduced anterior laryngeal mobility     Pharyngeal - Mechanical Soft Premature spillage to valleculae;Delayed  swallow initiation;Reduced anterior laryngeal mobility;Reduced laryngeal  elevation;Reduced tongue base retraction                                                                               No flowsheet data found.         Page, Selinda Orion 04/04/2014, 4:18 PM    Dg Swallowing Func-speech Pathology  03/16/2014    Objective Swallowing Evaluation:    Patient Details  Name: Emojean Gertz MRN: [id number] Date of Birth: March 27, 1932  Today's Date: 03/16/2014 Time: SLP Start Time: 0920-SLP Stop Time: 0950  SLP Time Calculation (min) (ACUTE ONLY): 30 min  Past Medical History:  Past Medical History  Diagnosis Date  . HYPERTENSION   . HYPERTHYROIDISM   . OBESITY   . ARTHRITIS   . Posttraumatic stress disorder   . INSOMNIA   . MIGRAINE HEADACHE   . Hyperthyroidism  s/p I-131 ablation 03/2011 of multinod goiter  . Arthritis    Past Surgical History:  Past Surgical History  Procedure Laterality Date  . Abdominal hysterectomy  1976  . Total hip arthroplasty  1998     right  . Tonsillectomy    . Breast surgery      biopsy  . Radiology with  anesthesia Left 03/08/2014    Procedure: RADIOLOGY WITH ANESTHESIA;  Surgeon: Rob Hickman, MD;   Location: [place];  Service: Radiology;  Laterality: Left;   HPI:  HPI: Extubated 3/4. 6 year of female admitted with right sided weakness,  facial droop, and difficulty speaking. CT showed a dense left MCA CVA. CT  chest showed a large thyroid mass as well as bilateral pulmonary emboli.  F/u MRI with patchy areas of acute left MCA territory infarction, several  punctate foci of acute right MCA territory infarction, possible trace  subarachnoid hemorrhage in the high right frontal lobe.PMH of HTN,  migraine headache, postraumatic stress disorder.  Initial MBS completed on  03/10/2014 with recommendations that pt remain NPO due to oral holding as  well as motor planning and cognitive deficits.  Diet initiated 03/11/2014 at  bedside per improved automaticity and timeliness of swallow initiation.   Repeat MBS ordered today to objectively determine safest diet  consistencies given questionable diet toleration and persistent wet vocal  quality noted at bedside during meals.     Assessment / Plan / Recommendation CHL IP CLINICAL IMPRESSIONS 03/16/2014  Dysphagia Diagnosis Moderate pharyngeal phase dysphagia;Moderate oral  phase dysphagia  Clinical impression Pt presents with a moderate multifactorial dysphagia  with motor, sensory, and cognitive components.  Pt presents with slightly  prolonged oral transit of boluses due to cognitive and motor planning  deficits, anterior loss of boluses due to decreased labial seal, and mild  oral residuals left in the oral cavity post swallow.  Pt presented with  relatively good airway protection during the swallow with honey thick  liquids via teaspoon and purees; however she was noted with reflux of  honey thick liquids through the UES and into the pyriforms, which then  spilled into the airway, resulting in deep, silent penetration of honey  thick liquids.  No reflux, aspiration, or  penetration observed with  heavier boluses of purees.  Pt unable to clear throat or cough on command  or use extra dry swallows to consistently clear pyriform residue prior to  entering the airway due to cognition, aphasia and apraxia.   Question the  impact of pt's recent diagnosis of thyroid mass with concomitant tracheal  displacement on esophageal function and contributing to observed reflux.   Pt may benefit from ENT consult or esophageal assessment. Recommend that  pt be conservatively downgraded to pudding thick liquids with continued  pureed textures due to lethargy,  decreased ability to follow commands for  use of swallowing precautions, and motor planning deficits.  Prognosis for  advancement good with improved alertness, ability to follow commands, and  trials of advanced consistencies with SLP.        CHL IP TREATMENT RECOMMENDATION 03/10/2014  Treatment Plan Recommendations Therapy as outlined in treatment plan below      CHL IP DIET RECOMMENDATION 03/16/2014  Diet Recommendations Dysphagia 1 (Puree);Pudding-thick liquid  Liquid Administration via Spoon  Medication Administration Crushed with puree  Compensations Slow rate;Small sips/bites  Postural Changes and/or Swallow Maneuvers Seated upright 90 degrees;Out of  bed for meals     CHL IP OTHER RECOMMENDATIONS 03/16/2014  Recommended Consults Consider ENT evaluation;Consider esophageal  assessment  Oral Care Recommendations Oral care BID  Other Recommendations (None)               SLP Swallow Goals     CHL IP REASON FOR REFERRAL 03/16/2014  Reason for Referral Objectively evaluate swallowing function     CHL IP ORAL PHASE 03/16/2014                             Oral - Honey Teaspoon Weak lingual manipulation;Delayed oral  transit;Reduced posterior propulsion                                   Oral - Puree Weak lingual manipulation;Reduced posterior  propulsion;Delayed oral transit                     CHL IP PHARYNGEAL PHASE 03/16/2014  Pharyngeal Phase  Impaired              Pharyngeal - Honey Teaspoon Reduced tongue base  retraction;Penetration/Aspiration after swallow;Pharyngeal residue -  pyriform sinuses  Penetration/Aspiration details (honey teaspoon) Material enters airway,  CONTACTS cords and not ejected out                                                                    Pharyngeal - Puree WFL                             Pharyngeal Comment (None)     CHL IP CERVICAL ESOPHAGEAL PHASE 03/16/2014  Cervical Esophageal Phase Impaired                                         Cervical Esophageal Comment reflux of honey thick liquids back to the  pyriforms which was silently penetrated to the level of the cords.             Page, Selinda Orion 03/16/2014, 3:39 PM     ASSESSMENT/PLAN:  Cerebral infarction - for rehabilitation; follow-up with Dr. Leonie Man, neurology, in 2 weeks Hypertension - continue HCTZ 25 mg by mouth daily and propranolol 20 mg by mouth twice a day DVT/PE - continue Coumadin 3 mg by mouth daily and for repeat INR on 04/10/14 Anemia, iron deficiency - hemoglobin 10.5; continue Niferex 150 mg by mouth twice a day Osteoarthritis - continue Voltaren gel and Mobic; start tramadol 50 mg by mouth every 7:30 PM Wart on left 5th toe - apply Curad Mediplast wart remover pad daily Onychomycosis - start Ciclopirox solution to left left big toenail daily Constipation - start senna S2 tabs by mouth daily at bedtime Insomnia - start trazodone 50 mg by mouth every 7:30 PM Goiter - TSH 0.133; follow-up with general surgery, Dr. Harlow Asa on 04/25/14   Goals of care:  Short-term rehabilitation   Labs/test ordered:  UA w/ cs, CBC, CMP   Spent 50 minutes in patient care.     [facility], NP [employer] [phone]

## 2014-04-09 NOTE — Progress Notes (Signed)
Recreational Therapy Discharge Summary Patient Details  Name: Terrie Haring MRN: [id number] Date of Birth: 11-May-1932 Today's Date: 04/09/2014  Long term goals set: 1  Long term goals met: 0   Comments on progress toward goals: Pt discharged to SNF for continued therapies and 24 hour assistance as family unable to provide this at discharge.  Pt requires mod-total multimodal cues for task completion when seated in w/c. Reasons for discharge: discharge from hospital  Patient/family agrees with progress made and goals achieved: Yes  Jakin Pavao 04/09/2014, 9:39 AM

## 2014-04-10 ENCOUNTER — Non-Acute Institutional Stay (SKILLED_NURSING_FACILITY): Payer: Medicare Other | Admitting: Internal Medicine

## 2014-04-10 DIAGNOSIS — D509 Iron deficiency anemia, unspecified: Secondary | ICD-10-CM | POA: Diagnosis not present

## 2014-04-10 DIAGNOSIS — K5900 Constipation, unspecified: Secondary | ICD-10-CM

## 2014-04-10 DIAGNOSIS — I82403 Acute embolism and thrombosis of unspecified deep veins of lower extremity, bilateral: Secondary | ICD-10-CM

## 2014-04-10 DIAGNOSIS — I6340 Cerebral infarction due to embolism of unspecified cerebral artery: Secondary | ICD-10-CM

## 2014-04-10 DIAGNOSIS — G4700 Insomnia, unspecified: Secondary | ICD-10-CM | POA: Diagnosis not present

## 2014-04-10 DIAGNOSIS — I69921 Dysphasia following unspecified cerebrovascular disease: Secondary | ICD-10-CM

## 2014-04-10 DIAGNOSIS — I10 Essential (primary) hypertension: Secondary | ICD-10-CM

## 2014-04-10 DIAGNOSIS — I69391 Dysphagia following cerebral infarction: Secondary | ICD-10-CM

## 2014-04-10 DIAGNOSIS — M159 Polyosteoarthritis, unspecified: Secondary | ICD-10-CM

## 2014-04-10 DIAGNOSIS — M150 Primary generalized (osteo)arthritis: Secondary | ICD-10-CM | POA: Diagnosis not present

## 2014-04-10 DIAGNOSIS — M8949 Other hypertrophic osteoarthropathy, multiple sites: Secondary | ICD-10-CM

## 2014-04-10 DIAGNOSIS — I2699 Other pulmonary embolism without acute cor pulmonale: Secondary | ICD-10-CM | POA: Diagnosis not present

## 2014-04-10 DIAGNOSIS — K219 Gastro-esophageal reflux disease without esophagitis: Secondary | ICD-10-CM

## 2014-04-10 DIAGNOSIS — I639 Cerebral infarction, unspecified: Secondary | ICD-10-CM

## 2014-04-10 NOTE — Progress Notes (Signed)
Patient ID: Sandy Barrett, female   DOB: [birth year], 81 y.o.   MRN: [id number]     [facility]   PCP: Gwendolyn Grant, MD  Code Status: full code  No Known Allergies  Chief Complaint  Patient presents with  . New Admit To SNF     HPI:  81 year old patient is here for short term rehabilitation post hospital admission from 03/08/14-03/13/14 and inpatient rehabilitation admission from 03/13/14-04/05/14 post acute stroke. She was admitted to the hospital with right sided weakness, speech difficulty and facial droop. Imaging showed dominant left MCA territory infarcts secondary to occlusion of the left internal carotid artery highly suspicious for paradoxical emboli. Perfusion study showed a large area of penumbra involving the left MCA territory. Chest CT was done with widened mediastinum seen on CXR but did not show an aortic aneurysm or dissection of aorta. It revealed a large thyroid mass as well as bilateral pulmonary emboli.  After aortic dissection was ruled out, tPA was administered. There was minimal improvement. She was then taken to IR and underwent  bilateral common carotid and Left vertebral arteriograms, followed by unsuccessful attempt at revascularization due technical difficulties by Dr Estanislado Pandy. She was started on iv heparin with her PE and DVT and later switched to pradaxa. She had global aphasia and right hemiparesis. Dysphagia 1 diet was initiated. Inpatient rehabilitation was started. Follow up CT head on 3/10 showed evolving Left MCA infarct with trace SAH and Dr. Leonie Man from neurology was consulted. pradaxa was stopped and coumadin with bridging with heparin was started. At time of discharge from inpatient rehab to SNF, she still had dense right sided hemiparesis, apraxia, anxiety with standing attempt and dysphagia.   She is seen in her room today with her sister present. She is unable to fully participate in HPI or ROS. She is able to answer  simple question by nodding her head. As per sister, she participated with speech therapy team today. She is currently under total care. No new concern from patient, sister or nursing staff. She has PMH of HTN, migraine, OA, HLD among others  Review of Systems: difficult to obtain Constitutional: Negative for fever, chills, diaphoresis.  HENT: Negative for headache.  Respiratory: Negative for cough, shortness of breath and wheezing.   Cardiovascular: Negative for chest pain, palpitations Gastrointestinal: Negative for heartburn, nausea, vomiting, abdominal pain Skin: Negative for itching   Past Medical History  Diagnosis Date  . HYPERTENSION   . HYPERTHYROIDISM   . OBESITY   . ARTHRITIS   . Posttraumatic stress disorder   . INSOMNIA   . MIGRAINE HEADACHE   . Hyperthyroidism     s/p I-131 ablation 03/2011 of multinod goiter  . Arthritis    Past Surgical History  Procedure Laterality Date  . Abdominal hysterectomy  1976  . Total hip arthroplasty  1998     right  . Tonsillectomy    . Breast surgery      biopsy  . Radiology with anesthesia Left 03/08/2014    Procedure: RADIOLOGY WITH ANESTHESIA;  Surgeon: Rob Hickman, MD;  Location: [place];  Service: Radiology;  Laterality: Left;   Social History:   reports that she has never smoked. She does not have any smokeless tobacco history on file. She reports that she does not drink alcohol or use illicit drugs.  Family History  Problem Relation Age of Onset  . Asthma Mother   . Asthma Father   . Prostate cancer Father  Medications: Patient's Medications  New Prescriptions   No medications on file  Previous Medications   ACETAMINOPHEN (TYLENOL) 325 MG TABLET    Take 1-2 tablets (325-650 mg total) by mouth every 4 (four) hours as needed for mild pain.   ANTISEPTIC ORAL RINSE (CPC / CETYLPYRIDINIUM CHLORIDE 0.05%) 0.05 % LIQD SOLUTION    7 mLs by Mouth Rinse route 2 (two) times daily.   DICLOFENAC SODIUM (VOLTAREN) 1 %  GEL    Apply 2 g topically 3 (three) times daily. To affected joint.   ESOMEPRAZOLE (NEXIUM) 40 MG CAPSULE    Take 1 capsule (40 mg total) by mouth daily.   IRON POLYSACCHARIDES (NIFEREX) 150 MG CAPSULE    Take 1 capsule (150 mg total) by mouth 2 (two) times daily before lunch and supper.   PROBIOTIC PRODUCT (ALIGN) 4 MG CAPS    Take by mouth daily.     TRAMADOL (ULTRAM) 50 MG TABLET    Take 1 tablet (50 mg total) by mouth 4 (four) times daily as needed for moderate pain.   TRAZODONE (DESYREL) 50 MG TABLET    Take 1 tablet (50 mg total) by mouth at bedtime as needed for sleep.   WARFARIN (COUMADIN) 3 MG TABLET    Take 1 tablet (3 mg total) by mouth daily at 6 PM.  Modified Medications   No medications on file  Discontinued Medications   No medications on file     Physical Exam: Filed Vitals:   04/10/14 1233  BP: 118/62  Pulse: 86  Temp: 98 F (36.7 C)  Resp: 18  Weight: 170 lb (77.111 kg)  SpO2: 95%    General- elderly female, in no acute distress Head- normocephalic, atraumatic Throat- moist mucus membrane Eyes- no pallor, no icterus, no discharge, normal conjunctiva, normal sclera Neck- no cervical lymphadenopathy Cardiovascular- normal s1,s2, no murmurs, palpable dorsalis pedis leg edema Respiratory- bilateral clear to auscultation, no wheeze, no rhonchi, no crackles, no use of accessory muscles Abdomen- bowel sounds present, soft, non tender Musculoskeletal- generalized weakness, right sided dense hemiparesis with UE flaccid, able to move left side Neurological- alert, apraxia, aphasia, following simple commands at times Skin- warm and dry   Labs reviewed: Basic Metabolic Panel:  Recent Labs  03/09/14 0450 03/10/14 0219  03/21/14 0705 03/28/14 1010 04/04/14 1059  NA 143  142 144  < > 142 142 141  K 3.1*  3.0* 2.8*  < > 3.9 4.0 4.2  CL 108  109 110  < > 109 108 106  CO2 25  26 26   < > 25 26 28   GLUCOSE 134*  137* 118*  < > 89 80 93  BUN 8  7 7   < > 8 8  10   CREATININE 0.55  0.53 0.57  < > 0.40* 0.45* 0.49*  CALCIUM 8.3*  8.2* 8.1*  < > 8.9 9.5 9.4  MG 1.8 1.9  --   --   --   --   PHOS 4.2  4.2  --   --   --   --   --   < > = values in this interval not displayed. Liver Function Tests:  Recent Labs  08/07/13 1059 03/08/14 1448 03/09/14 0450 03/14/14 0423  AST 15 50*  --  17  ALT 7 18  --  10  ALKPHOS 59 63  --  48  BILITOT 0.7 1.8*  --  1.3*  PROT 6.8 6.4  --  4.6*  ALBUMIN 3.6 3.3* 2.5*  2.4*   No results for input(s): LIPASE, AMYLASE in the last 8760 hours. No results for input(s): AMMONIA in the last 8760 hours. CBC:  Recent Labs  08/07/13 1059 03/08/14 1448  03/14/14 0423  03/31/14 0845 04/03/14 0559 04/05/14 0724  WBC 6.4 8.7  < > 8.6  < > 7.4 7.1 6.2  NEUTROABS 4.7 6.6  --  6.7  --   --   --   --   HGB 12.8 12.9  < > 8.8*  < > 10.6* 10.1* 10.5*  HCT 39.1 40.4  < > 27.8*  < > 35.3* 33.0* 33.9*  MCV 92.7 94.6  < > 94.2  < > 97.2 95.9 95.8  PLT 540.0* 409*  < > 509*  < > 618* 693* 707*  < > = values in this interval not displayed. Cardiac Enzymes: No results for input(s): CKTOTAL, CKMB, CKMBINDEX, TROPONINI in the last 8760 hours. BNP: Invalid input(s): POCBNP CBG:  Recent Labs  03/13/14 1621 03/13/14 2111 03/14/14 0648  GLUCAP 96 108* 116*    Radiological Exams: Transthoracic echo: normal ejection fraction but presence of interatrial septal aneurysm. Bubble study was not done.   MRI of the brain : moderate-sized left MCA infarct which was nonhemorrhagic and there are also several punctate foci of right MCA infarct and possible trace subarachnoid hemorrhage in the high right frontal convexity.  MRA brain: occluded left ICA and MCA.   lower extremity venous Doppler: DVT in the right distal common femoral vein, popliteal vein and left femoral vein, popliteal vein.   Carotid ultrasound: no significant extracranial stenosis with abnormal waveform with left ICA suggested distal occlusion.     Assessment/Plan  Acute CVA With right sided weakness, global aphasia and apraxia. Will her him work with physical therapy and occupational therapy team to help with gait training and muscle strengthening exercises.fall precautions. Skin care. Encourage to be out of bed. To be followed by SLP team. Needs assistance with ADLs. Has f/u with neurology  Dysphagia Continue dysphagia diet, aspiration precuations, follow up with SLP team  PE and DVT Continue coumadin with goal inr 2-3. Breathing stable  Iron deficiency anemia Continue iron supplement, monitor h&h  Hypertension Stable, continue HCTZ 25 mg daily and propranolol 20 mg bid  gerd Continue nexium for now  Osteoarthritis  continue Voltaren gel, tramadol and Mobic  Constipation Continue senna S 2 tabs daily  Insomnia Continue trazodone 50 mg daily    Labs/test ordered:  pending CBC, CMP   Goals of care: short term rehabilitation   Family/ staff Communication: reviewed care plan with patient and nursing supervisor    Blanchie Serve, MD  [place] [phone] (Monday-Friday 8 am - 5 pm) [phone] (afterhours)

## 2014-04-17 ENCOUNTER — Encounter: Payer: Self-pay | Admitting: Adult Health

## 2014-04-17 ENCOUNTER — Non-Acute Institutional Stay (SKILLED_NURSING_FACILITY): Payer: Medicare Other | Admitting: Adult Health

## 2014-04-17 DIAGNOSIS — G43009 Migraine without aura, not intractable, without status migrainosus: Secondary | ICD-10-CM | POA: Diagnosis not present

## 2014-04-17 DIAGNOSIS — N390 Urinary tract infection, site not specified: Secondary | ICD-10-CM | POA: Diagnosis not present

## 2014-04-17 NOTE — Progress Notes (Signed)
Patient ID: Sandy Barrett, female   DOB: November 25, 1932, 81 y.o.   MRN: [id number]   04/17/2014  Facility:  [facility] Home Location:  [facility] Room Number: 1205-P LEVEL OF CARE:  SNF (31)   Chief Complaint  Patient presents with  . Acute Visit    UTI and Migraine headache    HISTORY OF PRESENT ILLNESS:  This is a 81 year old female who had urinary retention. Urine culture shows >= 100,000 colonies/ml E. Coli. No reported fever nor hematuria. Patient complained of headache. Daughter reported that patient takes Propanolol for headache. Patient is aphasic and currently having short-term rehabilitation.  PAST MEDICAL HISTORY:  Past Medical History  Diagnosis Date  . HYPERTENSION   . HYPERTHYROIDISM   . OBESITY   . ARTHRITIS   . Posttraumatic stress disorder   . INSOMNIA   . MIGRAINE HEADACHE   . Hyperthyroidism     s/p I-131 ablation 03/2011 of multinod goiter  . Arthritis     CURRENT MEDICATIONS: Reviewed per MAR/see medication list  No Known Allergies   REVIEW OF SYSTEMS:  patient is aphasic  PHYSICAL EXAMINATION  GENERAL: no acute distress, normal body habitus SKIN:  Right buttock pressure sores NECK: supple, trachea midline, no neck masses, no thyroid tenderness, no thyromegaly LYMPHATICS: no LAN in the neck, no supraclavicular LAN RESPIRATORY: breathing is even & unlabored, BS CTAB CARDIAC: RRR, no murmur,no extra heart sounds, no edema GI: abdomen soft, normal BS, no masses, no tenderness, no hepatomegaly, no splenomegaly EXTREMITIES:  Right sided weakness; able to move LUE and LLE; left big toenail thick and white; left 5th toe has wart NEURO:  Aphasic; right sided weakness PSYCHIATRIC: the patient is alert & oriented to person, affect & behavior appropriate  LABS/RADIOLOGY: 04/10/14  sodium 142 potassium 4.5 glucose 79 BUN 9 creatinine 0.39 total bilirubin 0.5 alkaline phosphatase 68 SGOT 13 SGPT 9 total protein 5.6 albumin 3.2  calcium 9.2 WBC 6.9 hemoglobin 11.5 hematocrit 37.2 MCV 93.0 platelet count 856  urine culture >=100,000 colonies/ml E. coli Labs reviewed: Basic Metabolic Panel:  Recent Labs  03/09/14 0450 03/10/14 0219  03/21/14 0705 03/28/14 1010 04/04/14 1059  NA 143  142 144  < > 142 142 141  K 3.1*  3.0* 2.8*  < > 3.9 4.0 4.2  CL 108  109 110  < > 109 108 106  CO2 25  26 26   < > 25 26 28   GLUCOSE 134*  137* 118*  < > 89 80 93  BUN 8  7 7   < > 8 8 10   CREATININE 0.55  0.53 0.57  < > 0.40* 0.45* 0.49*  CALCIUM 8.3*  8.2* 8.1*  < > 8.9 9.5 9.4  MG 1.8 1.9  --   --   --   --   PHOS 4.2  4.2  --   --   --   --   --   < > = values in this interval not displayed. Liver Function Tests:  Recent Labs  08/07/13 1059 03/08/14 1448 03/09/14 0450 03/14/14 0423  AST 15 50*  --  17  ALT 7 18  --  10  ALKPHOS 59 63  --  48  BILITOT 0.7 1.8*  --  1.3*  PROT 6.8 6.4  --  4.6*  ALBUMIN 3.6 3.3* 2.5* 2.4*   CBC:  Recent Labs  08/07/13 1059 03/08/14 1448  03/14/14 0423  03/31/14 0845 04/03/14 0559 04/05/14 0724  WBC 6.4 8.7  < >  8.6  < > 7.4 7.1 6.2  NEUTROABS 4.7 6.6  --  6.7  --   --   --   --   HGB 12.8 12.9  < > 8.8*  < > 10.6* 10.1* 10.5*  HCT 39.1 40.4  < > 27.8*  < > 35.3* 33.0* 33.9*  MCV 92.7 94.6  < > 94.2  < > 97.2 95.9 95.8  PLT 540.0* 409*  < > 509*  < > 618* 693* 707*  < > = values in this interval not displayed.  Lipid Panel:  Recent Labs  08/07/13 1059 03/09/14 0450  HDL 51.20 31*   CBG:  Recent Labs  03/13/14 1621 03/13/14 2111 03/14/14 0648  GLUCAP 96 108* 116*     Dg Swallowing Func-speech Pathology  04/04/2014    Objective Swallowing Evaluation:    Patient Details  Name: Sandy Barrett MRN: [id number] Date of Birth: January 06, 1932  Today's Date: 04/04/2014 Time: SLP Start Time: 0930 -SLP Stop Time: 1000 SLP Time Calculation (min): 30 min  Past Medical History:  Past Medical History  Diagnosis Date  . HYPERTENSION   . HYPERTHYROIDISM   . OBESITY   .  ARTHRITIS   . Posttraumatic stress disorder   . INSOMNIA   . MIGRAINE HEADACHE   . Hyperthyroidism     s/p I-131 ablation 03/2011 of multinod goiter  . Arthritis    Past Surgical History:  Past Surgical History  Procedure Laterality Date  . Abdominal hysterectomy  1976  . Total hip arthroplasty  1998     right  . Tonsillectomy    . Breast surgery      biopsy  . Radiology with anesthesia Left 03/08/2014    Procedure: RADIOLOGY WITH ANESTHESIA;  Surgeon: Rob Hickman, MD;   Location: [facility];  Service: Radiology;  Laterality: Left;   HPI:  HPI: Extubated 3/4. 37 year of female admitted with right sided weakness,  facial droop, and difficulty speaking. CT showed a dense left MCA CVA. CT  chest showed a large thyroid mass as well as bilateral pulmonary emboli.  TPA administered. S/p bilateral common carotid and left vertebral  arteriograms, followed by unsuccessful attempt at revascularization. F/u  MRI with patchy areas of acute left MCA territory infarction, several  punctate foci of acute right MCA territory infarction, possible trace  subarachnoid hemorrhage in the high right frontal lobe.PMH of HTN,  migraine headache, postraumatic stress disorder.  Initial MBS recommended  pt remain NPO due to cognitive and motor planning deficits, pt initiated  on dys 1 textures, honey thick liquids at bedside 2 days later per results  of MBS and noted clinical improvements in automaticity of oral phase and  swallow response.  Pt downgraded to pudding thick liquids with continued  dys 1 textures due to audible wetness noted with meals and rhonchi.  Pt  upgraded back to honey thick liquids at bedside due to improvements noted  in alertness and management of secretions.  Pt has been tolerating honey  thick liquids via teaspoon with trials of honey thick liquids via cup with  minimal overt s/s of aspiration.  Repeat MBS ordered today to determine  readiness for diet progression.    Assessment / Plan / Recommendation CHL IP  CLINICAL IMPRESSIONS 04/04/2014  Dysphagia Diagnosis Moderate pharyngeal phase dysphagia;Moderate oral  phase dysphagia  Clinical impression Pt presents with a moderate oropharyngeal dysphagia  with sensory, motor,and cognitive components.  Pt demonstrates right sided  labial, lingual, and buccal weakness resulting in  anterior loss of  boluses, weak lingual manipulation, and prolonged oral transit of boluses  to the oropharynx.  Pt also presents with weak base of tongue resulting in  poor posterior containment of materials in the oral cavity.  The  abovementioned deficits, in addition to decreased pharyngeal sensation,  resulted in premature spillage of materials into the pharynx and delayed  swallow initiation with response triggered most consistently at the level  of the vallecula; however, swallow was delayed to the pyriforms with  larger boluses, thinner consistencies, or if pt became distracted by  environmental stimuli and failed to attend to boluses.  Delayed swallow  initiation resulted in deep, flash penetration during the swallow with  honey thick liquids via cup sips and silent aspiration during the swallow  with thin liquids.  Nectar thick liquids via straw were silently aspirated  after the swallow due to pharyngeal residuals spilling from the pyriforms  into the airway.  Penetration/Aspiration across all consistencies was  trace in amount.  Pt achieved effective airway protection with portion  control for small amounts of nectar thick liquids via cup.  As a result,  recommend that pt be upgraded to nectar thick liquids with continued dys 1  textures and full supervision for use of swallowing precautions; no  straws, small bites/sips, out of bed for meals, check for right pocketing  and anterior loss of boluses.  Prognosis for advancement good with  continued ST follow up for trials of advanced consistencies and improved  mentation.        CHL IP TREATMENT RECOMMENDATION 03/10/2014  Treatment Plan  Recommendations Therapy as outlined in treatment plan below      CHL IP DIET RECOMMENDATION 04/04/2014  Diet Recommendations Dysphagia 1 (Puree);Nectar-thick liquid  Liquid Administration via Cup  Medication Administration Crushed with puree  Compensations Slow rate;Small sips/bites;Check for pocketing;Follow solids  with liquid  Postural Changes and/or Swallow Maneuvers Out of bed for meals;Seated  upright 90 degrees                  SLP Swallow Goals     CHL IP REASON FOR REFERRAL 04/04/2014  Reason for Referral Objectively evaluate swallowing function     CHL IP ORAL PHASE 04/04/2014                    Oral Phase Impaired         Oral - Honey Teaspoon Weak lingual manipulation;Reduced posterior  propulsion;Delayed oral transit        Oral - Nectar Teaspoon Right anterior bolus loss;Lingual pumping;Reduced  posterior propulsion;Holding of bolus;Lingual/palatal residue;Delayed oral  transit  Oral - Nectar Cup Right anterior bolus loss;Lingual pumping;Holding of  bolus;Lingual/palatal residue;Reduced posterior propulsion;Delayed oral  transit  Oral - Nectar Straw Right anterior bolus loss;Lingual pumping;Holding of  bolus;Lingual/palatal residue;Reduced posterior propulsion;Delayed oral  transit        Oral - Thin Teaspoon Reduced posterior propulsion;Delayed oral  transit;Lingual/palatal residue;Holding of bolus;Lingual pumping;Right  anterior bolus loss  Oral - Thin Cup Reduced posterior propulsion;Delayed oral  transit;Lingual/palatal residue;Holding of bolus;Lingual pumping;Right  anterior bolus loss  Oral - Thin Straw Delayed oral transit;Left pocketing in lateral  sulci;Lingual/palatal residue;Right anterior bolus loss;Lingual  pumping;Holding of bolus     Oral - Puree Weak lingual manipulation;Reduced posterior  propulsion;Delayed oral transit;Right anterior bolus loss;Lingual/palatal  residue;Right pocketing in lateral sulci     Oral - Regular Impaired mastication;Incomplete tongue to palate  contact;Delayed  oral transit;Reduced posterior propulsion;Weak lingual  manipulation;Right pocketing  in lateral sulci;Lingual/palatal  residue;Right anterior bolus loss               CHL IP PHARYNGEAL PHASE 04/04/2014  Pharyngeal Phase Impaired              Pharyngeal - Honey Teaspoon Reduced tongue base retraction;Premature  spillage to valleculae;Reduced anterior laryngeal mobility;Reduced  laryngeal elevation;Delayed swallow initiation     Pharyngeal - Honey Cup Reduced laryngeal elevation;Reduced tongue base  retraction;Premature spillage to valleculae;Reduced anterior laryngeal  mobility;Delayed swallow initiation;Premature spillage to pyriform  sinuses;Penetration/Aspiration during swallow  Penetration/Aspiration details (honey cup) Material enters airway,  CONTACTS cords then ejected out        Pharyngeal - Nectar Teaspoon Delayed swallow initiation;Premature spillage  to pyriform sinuses;Reduced tongue base retraction;Premature spillage to  valleculae;Reduced laryngeal elevation;Reduced anterior laryngeal  mobility;Pharyngeal residue - pyriform sinuses     Pharyngeal - Nectar Cup Delayed swallow initiation;Premature spillage to  pyriform sinuses;Reduced tongue base retraction;Reduced anterior laryngeal  mobility;Reduced laryngeal elevation;Pharyngeal residue - pyriform sinuses      Pharyngeal - Nectar Straw Reduced tongue base retraction;Reduced laryngeal  elevation;Reduced anterior laryngeal mobility;Premature spillage to  pyriform sinuses;Delayed swallow initiation;Premature spillage to  valleculae;Penetration/Aspiration after swallow;Pharyngeal residue -  pyriform sinuses;Reduced airway/laryngeal closure  Penetration/Aspiration details (nectar straw) Material enters airway,  passes BELOW cords without attempt by patient to eject out (silent  aspiration)              Pharyngeal - Thin Teaspoon Delayed swallow initiation;Premature spillage  to pyriform sinuses;Penetration/Aspiration during swallow;Reduced tongue  base  retraction;Reduced laryngeal elevation;Reduced anterior laryngeal  mobility;Trace aspiration  Penetration/Aspiration details (thin teaspoon) Material enters airway,  CONTACTS cords and not ejected out  Pharyngeal - Thin Cup Premature spillage to pyriform sinuses;Reduced  tongue base retraction;Reduced laryngeal elevation;Reduced anterior  laryngeal mobility;Pharyngeal residue - pyriform  sinuses;Penetration/Aspiration during swallow  Penetration/Aspiration details (thin cup) Material enters airway, CONTACTS  cords and not ejected out;Material enters airway, passes BELOW cords  without attempt by patient to eject out (silent aspiration)              Pharyngeal - Puree Premature spillage to valleculae;Reduced tongue base  retraction;Reduced laryngeal elevation;Reduced airway/laryngeal  closure;Reduced anterior laryngeal mobility     Pharyngeal - Mechanical Soft Premature spillage to valleculae;Delayed  swallow initiation;Reduced anterior laryngeal mobility;Reduced laryngeal  elevation;Reduced tongue base retraction                                                                               No flowsheet data found.         Page, Selinda Orion 04/04/2014, 4:18 PM     ASSESSMENT/PLAN:  UTI - start Bactrim DS 1 tab by mouth twice a day 7 days  Migraine headache - start propanolol 10 mg 1 tab by mouth daily when necessary   [facility], NP [place]

## 2014-04-25 ENCOUNTER — Other Ambulatory Visit: Payer: Self-pay | Admitting: Surgery

## 2014-04-25 DIAGNOSIS — E049 Nontoxic goiter, unspecified: Secondary | ICD-10-CM | POA: Diagnosis not present

## 2014-04-26 ENCOUNTER — Other Ambulatory Visit: Payer: Self-pay | Admitting: Surgery

## 2014-04-26 DIAGNOSIS — E049 Nontoxic goiter, unspecified: Secondary | ICD-10-CM

## 2014-04-27 ENCOUNTER — Other Ambulatory Visit: Payer: Self-pay | Admitting: *Deleted

## 2014-05-01 ENCOUNTER — Other Ambulatory Visit: Payer: Medicare Other

## 2014-05-04 ENCOUNTER — Encounter: Payer: Self-pay | Admitting: Physical Medicine & Rehabilitation

## 2014-05-04 ENCOUNTER — Ambulatory Visit (HOSPITAL_BASED_OUTPATIENT_CLINIC_OR_DEPARTMENT_OTHER): Payer: Medicare Other | Admitting: Physical Medicine & Rehabilitation

## 2014-05-04 ENCOUNTER — Encounter: Payer: Medicare Other | Attending: Physical Medicine & Rehabilitation

## 2014-05-04 VITALS — BP 116/58 | HR 94 | Resp 14

## 2014-05-04 DIAGNOSIS — M7541 Impingement syndrome of right shoulder: Secondary | ICD-10-CM

## 2014-05-04 DIAGNOSIS — I69320 Aphasia following cerebral infarction: Secondary | ICD-10-CM | POA: Diagnosis not present

## 2014-05-04 DIAGNOSIS — I639 Cerebral infarction, unspecified: Secondary | ICD-10-CM | POA: Diagnosis not present

## 2014-05-04 DIAGNOSIS — M6289 Other specified disorders of muscle: Secondary | ICD-10-CM | POA: Insufficient documentation

## 2014-05-04 DIAGNOSIS — G8190 Hemiplegia, unspecified affecting unspecified side: Secondary | ICD-10-CM | POA: Diagnosis not present

## 2014-05-04 DIAGNOSIS — R531 Weakness: Secondary | ICD-10-CM

## 2014-05-04 DIAGNOSIS — I69921 Dysphasia following unspecified cerebrovascular disease: Secondary | ICD-10-CM

## 2014-05-04 DIAGNOSIS — I69391 Dysphagia following cerebral infarction: Secondary | ICD-10-CM

## 2014-05-04 DIAGNOSIS — IMO0002 Reserved for concepts with insufficient information to code with codable children: Secondary | ICD-10-CM

## 2014-05-04 DIAGNOSIS — G8191 Hemiplegia, unspecified affecting right dominant side: Secondary | ICD-10-CM

## 2014-05-04 NOTE — Patient Instructions (Signed)
Reflex sympathetic dystrophy right upper extremity  Recommend prednisone 10mg  burst and taper Take 4 tablets once a day for 3 days Take 3 tablets once a day for 3 days Take 2 tablets once a day for 3 days Take 1 tablet once a day for 3 days   May need repeat subacromial bursa injection in 1 month

## 2014-05-04 NOTE — Progress Notes (Signed)
Subjective:  BLE dopplers revealed DVT R-distal CFV, FV, popliteal vein and DVT L-FV and popliteal vein.  2D echo revealed EF 65-70% with interatrial septal aneurysm.  Patient started on IV heparin for treatment on 03/04 and tolerated extubated without difficulty. Protein C, Protein S and lupus anticoagulant negative. Surgery consult recommended for work up once patient stable and question of thyroid malignancy. Dr. Leonie Man recommends starting oral anticoagulant one week for stroke due r paradoxical embolic. To repeat CCT on 03/10 and if negative for bleed to start  Pradaxa   Patient ID: Sandy Barrett, female    DOB: 03/08/1932, 81 y.o.   MRN: [id number] CIR F/u Admit date: 03/13/2014 Discharge date: 04/05/2014  Now at [facility].  Review of records had UTI ~4/12 Patient is a phasic unable to complete review of systems Daughter is here with the patient discussed her treatment thus far  Patient has had increasing swelling of the right upper extremity, now uses compression gauntlet right upper extremity No falls or trauma to that area Using lap tray and trying to elevate but has difficulty with the blue foam wedge  Receiving PT and OT cotreatment approximate 1 hour 6 times per week Receiving speech therapy one half hour 5 days a week  We discussed therapy frequency and duration which is appropriate at this point  Patient has her own home but we discussed that I do not think she'll  walk by herself again and also that goal should be just walking with 1 person assist  Pain Inventory Average Pain 6 Pain Right Now 4 My pain is intermittent, burning, dull, tingling and aching  In the last 24 hours, has pain interfered with the following? General activity 2 Relation with others 2 Enjoyment of life 2 What TIME of day is your pain at its worst? morning and daytime Sleep (in general) Fair  Pain is worse with: walking, bending, sitting, inactivity, standing and some activites Pain improves  with: rest and medication Relief from Meds: 6  Mobility ability to climb steps?  no do you drive?  no use a wheelchair  Function retired  Neuro/Psych bladder control problems weakness numbness confusion  Prior Studies Any changes since last visit?  no  Physicians involved in your care Any changes since last visit?  no   Family History  Problem Relation Age of Onset  . Asthma Mother   . Asthma Father   . Prostate cancer Father   . Stroke Brother    History   Social History  . Marital Status: Widowed    Spouse Name: N/A  . Number of Children: N/A  . Years of Education: N/A   Social History Main Topics  . Smoking status: Never Smoker   . Smokeless tobacco: Not on file  . Alcohol Use: Not on file  . Drug Use: Not on file  . Sexual Activity: No   Other Topics Concern  . None   Social History Narrative   Past Surgical History  Procedure Laterality Date  . Abdominal hysterectomy  1976  . Total hip arthroplasty  1998     right  . Tonsillectomy    . Breast surgery      biopsy  . Radiology with anesthesia Left 03/08/2014    Procedure: RADIOLOGY WITH ANESTHESIA;  Surgeon: Rob Hickman, MD;  Location: [place];  Service: Radiology;  Laterality: Left;   Past Medical History  Diagnosis Date  . HYPERTENSION   . HYPERTHYROIDISM   . OBESITY   .  ARTHRITIS   . Posttraumatic stress disorder   . INSOMNIA   . MIGRAINE HEADACHE   . Hyperthyroidism     s/p I-131 ablation 03/2011 of multinod goiter  . Arthritis    BP 116/58 mmHg  Pulse 94  Resp 14  SpO2 96%  Opioid Risk Score:   Fall Risk Score: Moderate Fall Risk (6-13 points)`1  Depression screen PHQ 2/9  Depression screen [facility] 2/9 08/07/2013 12/24/2011  Decreased Interest 0 0  Down, Depressed, Hopeless 1 0  PHQ - 2 Score 1 0     HPI   Review of Systems  Constitutional: Negative.   HENT: Negative.   Eyes: Negative.   Respiratory: Negative.   Cardiovascular: Negative.   Gastrointestinal:  Negative.   Endocrine: Negative.   Genitourinary: Negative.   Musculoskeletal: Positive for joint swelling.       Right arm, both hips and both knees  Skin: Negative.   Allergic/Immunologic: Negative.   Neurological: Positive for speech difficulty.  Hematological: Negative.   Psychiatric/Behavioral: Positive for confusion and dysphoric mood. The patient is nervous/anxious.        Objective:   Physical Exam  Constitutional: She is oriented to person, place, and time. She appears well-developed and well-nourished.  HENT:  Head: Normocephalic and atraumatic.  Eyes: Conjunctivae and EOM are normal. Pupils are equal, round, and reactive to light.  Neurological: She is alert and oriented to person, place, and time.  Unable to fully assess sensation. She does have gross sensation to pain but based on aphasia cannot do discriminative testing Tone is reduced in the right upper extremity and starting to help some increasing lower extremity extensor tone Motor strength is 0/5 in the right deltoid, biceps, triceps, grip, hip flexor Trace knee extensor with plantar flexor 0 rright ankle dorsiflexor  Left side grossly has antigravity plus strength but once again has difficulty cooperating with manual muscle testing  Gait was not tested she is nonambulatory  Patient has 2+ edema in the dorsum of the right hand as well as forearm. She has no pain with finger range of motion no pain with MCP compression but she does have pain with wrist flexion and extension passively as well as supination pronation of the elbow.  Psychiatric: She has a normal mood and affect.  Severe global aphasia difficulty with following commands, evidence of apraxia She is inattentive.  Nursing note and vitals reviewed.  Positive pain with abduction in the right shoulder, At 90       Assessment & Plan:  1. Left MCA infarct 3/3 With right hemiplegia, global aphasia, apraxia She is making slow improvements mainly with  the right lower extremity strength and mobility. We discussed that motor recovery plateaus in the 3-6 month range and that I do not expect the patient ambulated independently again.  2. Right shoulder hand syndrome, RSD variant post stroke, continue physical therapy, recommend prednisone burst and taper and milligram dose Take 4 tablets once a day for 3 days Take 3 tablets once a day for 3 days Take 2 tablets once a day for 3 days Take 1 tablet once a day for 3 days then discontinued   3. Right shoulder impingement syndrome, Post stroke, continue PT OT, subacromial injection today may need to repeat in 1 month  Shoulder injectionR  WITHOUT  ultrasound guidance)  Indication:Right Shoulder pain not relieved by medication management and other conservative care.  Informed consent was obtained after describing risks and benefits of the procedure with the patient, this  includes bleeding, bruising, infection and medication side effects. The patient wishes to proceed and has given written consent. Patient was placed in a seated position. TheRightshoulder was marked and prepped with betadine in the subacromial area. A 25-gauge 1-1/2 inch needle was inserted into the subacromial area. After negative draw back for blood, a solution containing 1 mL of 6 mg per ML betamethasone and 4 mL of 1% lidocaine was injected. A band aid was applied. The patient tolerated the procedure well. Post procedure instructions were given.

## 2014-05-06 DIAGNOSIS — G8110 Spastic hemiplegia affecting unspecified side: Secondary | ICD-10-CM | POA: Diagnosis not present

## 2014-05-06 DIAGNOSIS — I82403 Acute embolism and thrombosis of unspecified deep veins of lower extremity, bilateral: Secondary | ICD-10-CM | POA: Diagnosis not present

## 2014-05-06 DIAGNOSIS — G43009 Migraine without aura, not intractable, without status migrainosus: Secondary | ICD-10-CM | POA: Diagnosis not present

## 2014-05-06 DIAGNOSIS — M1712 Unilateral primary osteoarthritis, left knee: Secondary | ICD-10-CM | POA: Diagnosis not present

## 2014-05-06 DIAGNOSIS — M1710 Unilateral primary osteoarthritis, unspecified knee: Secondary | ICD-10-CM | POA: Diagnosis not present

## 2014-05-06 DIAGNOSIS — M799 Soft tissue disorder, unspecified: Secondary | ICD-10-CM | POA: Diagnosis not present

## 2014-05-06 DIAGNOSIS — I69351 Hemiplegia and hemiparesis following cerebral infarction affecting right dominant side: Secondary | ICD-10-CM | POA: Diagnosis not present

## 2014-05-06 DIAGNOSIS — G629 Polyneuropathy, unspecified: Secondary | ICD-10-CM | POA: Diagnosis not present

## 2014-05-06 DIAGNOSIS — R278 Other lack of coordination: Secondary | ICD-10-CM | POA: Diagnosis not present

## 2014-05-06 DIAGNOSIS — I2699 Other pulmonary embolism without acute cor pulmonale: Secondary | ICD-10-CM | POA: Diagnosis not present

## 2014-05-06 DIAGNOSIS — E079 Disorder of thyroid, unspecified: Secondary | ICD-10-CM | POA: Diagnosis not present

## 2014-05-06 DIAGNOSIS — M25559 Pain in unspecified hip: Secondary | ICD-10-CM | POA: Diagnosis not present

## 2014-05-06 DIAGNOSIS — G4700 Insomnia, unspecified: Secondary | ICD-10-CM | POA: Diagnosis not present

## 2014-05-06 DIAGNOSIS — I69891 Dysphagia following other cerebrovascular disease: Secondary | ICD-10-CM | POA: Diagnosis not present

## 2014-05-06 DIAGNOSIS — E049 Nontoxic goiter, unspecified: Secondary | ICD-10-CM | POA: Diagnosis not present

## 2014-05-06 DIAGNOSIS — D509 Iron deficiency anemia, unspecified: Secondary | ICD-10-CM | POA: Diagnosis not present

## 2014-05-06 DIAGNOSIS — I82C12 Acute embolism and thrombosis of left internal jugular vein: Secondary | ICD-10-CM | POA: Diagnosis not present

## 2014-05-06 DIAGNOSIS — N390 Urinary tract infection, site not specified: Secondary | ICD-10-CM | POA: Diagnosis not present

## 2014-05-06 DIAGNOSIS — M6289 Other specified disorders of muscle: Secondary | ICD-10-CM | POA: Diagnosis present

## 2014-05-06 DIAGNOSIS — I6340 Cerebral infarction due to embolism of unspecified cerebral artery: Secondary | ICD-10-CM | POA: Diagnosis not present

## 2014-05-06 DIAGNOSIS — M150 Primary generalized (osteo)arthritis: Secondary | ICD-10-CM | POA: Diagnosis not present

## 2014-05-06 DIAGNOSIS — R482 Apraxia: Secondary | ICD-10-CM | POA: Diagnosis not present

## 2014-05-06 DIAGNOSIS — I63412 Cerebral infarction due to embolism of left middle cerebral artery: Secondary | ICD-10-CM | POA: Diagnosis not present

## 2014-05-06 DIAGNOSIS — I69820 Aphasia following other cerebrovascular disease: Secondary | ICD-10-CM | POA: Diagnosis not present

## 2014-05-06 DIAGNOSIS — I69320 Aphasia following cerebral infarction: Secondary | ICD-10-CM | POA: Diagnosis not present

## 2014-05-06 DIAGNOSIS — M7541 Impingement syndrome of right shoulder: Secondary | ICD-10-CM | POA: Diagnosis not present

## 2014-05-06 DIAGNOSIS — M6281 Muscle weakness (generalized): Secondary | ICD-10-CM | POA: Diagnosis not present

## 2014-05-06 DIAGNOSIS — I82413 Acute embolism and thrombosis of femoral vein, bilateral: Secondary | ICD-10-CM | POA: Diagnosis not present

## 2014-05-06 DIAGNOSIS — R4702 Dysphasia: Secondary | ICD-10-CM | POA: Diagnosis not present

## 2014-05-06 DIAGNOSIS — K5900 Constipation, unspecified: Secondary | ICD-10-CM | POA: Diagnosis not present

## 2014-05-06 DIAGNOSIS — E042 Nontoxic multinodular goiter: Secondary | ICD-10-CM | POA: Diagnosis not present

## 2014-05-06 DIAGNOSIS — K219 Gastro-esophageal reflux disease without esophagitis: Secondary | ICD-10-CM | POA: Diagnosis not present

## 2014-05-06 DIAGNOSIS — G8190 Hemiplegia, unspecified affecting unspecified side: Secondary | ICD-10-CM | POA: Diagnosis not present

## 2014-05-06 DIAGNOSIS — I82433 Acute embolism and thrombosis of popliteal vein, bilateral: Secondary | ICD-10-CM | POA: Diagnosis not present

## 2014-05-06 DIAGNOSIS — I639 Cerebral infarction, unspecified: Secondary | ICD-10-CM | POA: Diagnosis not present

## 2014-05-06 DIAGNOSIS — I69890 Apraxia following other cerebrovascular disease: Secondary | ICD-10-CM | POA: Diagnosis not present

## 2014-05-06 DIAGNOSIS — I253 Aneurysm of heart: Secondary | ICD-10-CM | POA: Diagnosis not present

## 2014-05-06 DIAGNOSIS — I10 Essential (primary) hypertension: Secondary | ICD-10-CM | POA: Diagnosis not present

## 2014-05-09 ENCOUNTER — Encounter: Payer: Self-pay | Admitting: Adult Health

## 2014-05-09 ENCOUNTER — Ambulatory Visit
Admission: RE | Admit: 2014-05-09 | Discharge: 2014-05-09 | Disposition: A | Discharge status: Home / Self Care | Payer: Medicare Other | Source: Ambulatory Visit | Attending: Surgery | Admitting: Surgery

## 2014-05-09 ENCOUNTER — Non-Acute Institutional Stay (SKILLED_NURSING_FACILITY): Payer: Medicare Other | Admitting: Adult Health

## 2014-05-09 DIAGNOSIS — E042 Nontoxic multinodular goiter: Secondary | ICD-10-CM

## 2014-05-09 DIAGNOSIS — I6340 Cerebral infarction due to embolism of unspecified cerebral artery: Secondary | ICD-10-CM

## 2014-05-09 DIAGNOSIS — I82403 Acute embolism and thrombosis of unspecified deep veins of lower extremity, bilateral: Secondary | ICD-10-CM

## 2014-05-09 DIAGNOSIS — K5900 Constipation, unspecified: Secondary | ICD-10-CM

## 2014-05-09 DIAGNOSIS — G43009 Migraine without aura, not intractable, without status migrainosus: Secondary | ICD-10-CM | POA: Diagnosis not present

## 2014-05-09 DIAGNOSIS — I10 Essential (primary) hypertension: Secondary | ICD-10-CM | POA: Diagnosis not present

## 2014-05-09 DIAGNOSIS — G4700 Insomnia, unspecified: Secondary | ICD-10-CM

## 2014-05-09 DIAGNOSIS — D509 Iron deficiency anemia, unspecified: Secondary | ICD-10-CM | POA: Diagnosis not present

## 2014-05-09 DIAGNOSIS — I639 Cerebral infarction, unspecified: Secondary | ICD-10-CM

## 2014-05-09 DIAGNOSIS — M159 Polyosteoarthritis, unspecified: Secondary | ICD-10-CM

## 2014-05-09 DIAGNOSIS — M7541 Impingement syndrome of right shoulder: Secondary | ICD-10-CM

## 2014-05-09 DIAGNOSIS — B372 Candidiasis of skin and nail: Secondary | ICD-10-CM

## 2014-05-09 DIAGNOSIS — Z7901 Long term (current) use of anticoagulants: Secondary | ICD-10-CM

## 2014-05-09 DIAGNOSIS — K219 Gastro-esophageal reflux disease without esophagitis: Secondary | ICD-10-CM

## 2014-05-09 DIAGNOSIS — I2699 Other pulmonary embolism without acute cor pulmonale: Secondary | ICD-10-CM

## 2014-05-09 DIAGNOSIS — M8949 Other hypertrophic osteoarthropathy, multiple sites: Secondary | ICD-10-CM

## 2014-05-09 DIAGNOSIS — M150 Primary generalized (osteo)arthritis: Secondary | ICD-10-CM | POA: Diagnosis not present

## 2014-05-09 NOTE — Progress Notes (Signed)
Patient ID: Sandy Barrett, female   DOB: 01-07-1932, 81 y.o.   MRN: [id number]   05/09/2014  Facility:  Nursing Home Location:  [place] Room Number: 1205-P LEVEL OF CARE:  SNF (31)   Chief Complaint  Patient presents with  . Medical Management of Chronic Issues    Embolic stroke, hypertension, DVTs, PE, anemia, osteoarthritis, wart, onychomycosis, constipation and insomnia    HISTORY OF PRESENT ILLNESS:  This is a 81 year old female who is being seen for a routine visit. She is incontinent of stool and urine. Noted to have erythematous wet rashes on bilateral abdominal folds and groin area. Latest hgb 11.5 and currently takes Niferex 150 mg BID.  Latest INR  2.6 - therapeutic. She was recently treated for right shoulder impingement syndrome and currently on Prednisone taper.   She has been admitted to [facility] on 04/05/14 from [facility]. She has PMH of hypertension, hyperlipidemia, hyper thyroid disease in, osteoarthritis and migraine. She presented to [facility] with speech difficulty, right sided weakness and facial droop. She was then transferred to [facility] for stroke workup. CT showed left MCA sign. She is aphasic, has right hemiparesis and right hemianopsia. CT chest showed a large thyroid mass and bilateral PE. tPA was administered. Minimal improvement. She was also found to have bilateral lower extremity DVT for which she was started on heparin drip and switched to Coumadin.  She is currently having short-term rehabilitation.  PAST MEDICAL HISTORY:  Past Medical History  Diagnosis Date  . HYPERTENSION   . HYPERTHYROIDISM   . OBESITY   . ARTHRITIS   . Posttraumatic stress disorder   . INSOMNIA   . MIGRAINE HEADACHE   . Hyperthyroidism     s/p I-131 ablation 03/2011 of multinod goiter  . Arthritis     CURRENT MEDICATIONS: Reviewed per MAR/see medication list  No Known Allergies   REVIEW OF SYSTEMS:   Hard to obtain due to being aphasic  PHYSICAL EXAMINATION  GENERAL: no acute distress, normal body habitus SKIN:  Erythematous wet rashes on bilateral abdominal folds and grpoins EYES: conjunctivae normal, sclerae normal, normal eye lids NECK: supple, trachea midline, no neck masses, no thyroid tenderness, no thyromegaly LYMPHATICS: no LAN in the neck, no supraclavicular LAN RESPIRATORY: breathing is even & unlabored, BS CTAB CARDIAC: RRR, no murmur,no extra heart sounds, RUE edema 2+ with edema sleeves GI: abdomen soft, normal BS, no masses, no tenderness, no hepatomegaly, no splenomegaly EXTREMITIES:  Right sided weakness; able to move LUE and LLE;  NEURO:  Aphasic; right sided weakness PSYCHIATRIC: the patient is alert & oriented to person, affect & behavior appropriate  LABS/RADIOLOGY: Labs reviewed: 04/10/14  sodium 142 potassium 4.5 glucose 79 BUN 9 creatinine 0.39 total bilirubin 0.5 alkaline phosphatase 68 SGOT 13 SGPT 9 total protein 5.6 albumin 3.2 calcium 9.2 WBC 6.9 hemoglobin 11.5 hematocrit 37.2 MCV 15.1 Basic Metabolic Panel:  Recent Labs  03/09/14 0450 03/10/14 0219  03/21/14 0705 03/28/14 1010 04/04/14 1059  NA 143  142 144  < > 142 142 141  K 3.1*  3.0* 2.8*  < > 3.9 4.0 4.2  CL 108  109 110  < > 109 108 106  CO2 25  26 26   < > 25 26 28   GLUCOSE 134*  137* 118*  < > 89 80 93  BUN 8  7 7   < > 8 8 10   CREATININE 0.55  0.53 0.57  < > 0.40*  0.45* 0.49*  CALCIUM 8.3*  8.2* 8.1*  < > 8.9 9.5 9.4  MG 1.8 1.9  --   --   --   --   PHOS 4.2  4.2  --   --   --   --   --   < > = values in this interval not displayed. Liver Function Tests:  Recent Labs  08/07/13 1059 03/08/14 1448 03/09/14 0450 03/14/14 0423  AST 15 50*  --  17  ALT 7 18  --  10  ALKPHOS 59 63  --  48  BILITOT 0.7 1.8*  --  1.3*  PROT 6.8 6.4  --  4.6*  ALBUMIN 3.6 3.3* 2.5* 2.4*   CBC:  Recent Labs  08/07/13 1059 03/08/14 1448  03/14/14 0423  03/31/14 0845 04/03/14 0559  04/05/14 0724  WBC 6.4 8.7  < > 8.6  < > 7.4 7.1 6.2  NEUTROABS 4.7 6.6  --  6.7  --   --   --   --   HGB 12.8 12.9  < > 8.8*  < > 10.6* 10.1* 10.5*  HCT 39.1 40.4  < > 27.8*  < > 35.3* 33.0* 33.9*  MCV 92.7 94.6  < > 94.2  < > 97.2 95.9 95.8  PLT 540.0* 409*  < > 509*  < > 618* 693* 707*  < > = values in this interval not displayed.  Lipid Panel:  Recent Labs  08/07/13 1059 03/09/14 0450  HDL 51.20 31*   CBG:  Recent Labs  03/13/14 1621 03/13/14 2111 03/14/14 0648  GLUCAP 96 108* 116*     US Soft Tissue Head/neck  05/09/2014   CLINICAL DATA:  Follow-up nodule  EXAM: THYROID ULTRASOUND  TECHNIQUE: Ultrasound examination of the thyroid gland and adjacent soft tissues was performed.  COMPARISON:  09/02/2012  FINDINGS: Right thyroid lobe  Measurements: 6.6 x 2.9 x 3.6 cm. Solid lower pole nodule measures 2.3 x 2.3 x 2.8 cm and previously measured 3.0 x 1.9 x 2.2 cm. Smaller upper pole nodules are also present. The largest measures 1.1 cm.  Left thyroid lobe  Measurements: 9.1 x 5.5 x 5.7 cm. Left lobe is heterogeneous. There is a lower pole mass measuring 5.0 x 5.6 cm. The sagittal measurement cannot be measured because the inferior aspect is obscured.  Isthmus  Thickness: 1.5 cm.  No nodules visualized.  Lymphadenopathy  None visualized.  Additional findings  There is heterogeneous signal in the right internal jugular vein. The vein was not compressed, therefore DVT cannot be confirmed by this study.  IMPRESSION: Bilateral thyroid nodules. There is a new nodule identified in the lower pole of the left lobe. It measures 5.5 cm. Findings meet consensus criteria for biopsy. Ultrasound-guided fine needle aspiration should be considered, as per the consensus statement: Management of Thyroid Nodules Detected at [place]: Society of Radiologists in [place]. Radiology 2005; [id number].  There is heterogeneous signal in the right internal jugular vein. This may  represent DVT or slow flow an was noted on the prior study. If DVT is suspected, dedicated Doppler study can be performed.   Electronically Signed   By: Marybelle Killings M.D.   On: 05/09/2014 12:34    ASSESSMENT/PLAN:  Embolic stroke - continue rehabilitation; follows-up with Dr. Leonie Man Hypertension - continue HCTZ 25 mg by mouth daily and propranolol 20 mg by mouth twice a day DVT/PE - continue Coumadin 4mg  by mouth daily Long-term use of anticoagulant - INR 2.6 -  therapeutic; continue Coumadin 4 mg PO Q D and repeat INR on 05/15/14 Anemia, iron deficiency - hemoglobin 11.5; discontinue Niferex  Osteoarthritis - continue Voltaren gel, Mobicand tramadol 50 mg by mouth every 7:30 PM Constipation - continue senna S2 tabs by mouth daily at bedtime Insomnia - continue trazodone 50 mg by mouth every 7:30 PM Multinodular goiter - TSH 0.133; follows-up with general surgery, Dr. Harlow Asa Cand[place], skin - start nystatin powder to rashes on bilateral abdominal folds and groin 4 times a day 3 weeks and clotrimazole AF 1% cream to rashes on bilateral abdomen and bilateral groin twice a day 2 weeks Right shoulder impingement controlled - S/P subacromial injection and currently on Prednisone taper Migraine - continue Propanolol 10 mg 1 tab PO Q D PRN GERD - continue Prilosec 20 mg PO Q D   Goals of care:  Short-term rehabilitation   Labs/test ordered:  cbc in 2 weeks    [facility], [place] [phone]

## 2014-05-14 ENCOUNTER — Telehealth: Payer: Self-pay | Admitting: Internal Medicine

## 2014-05-14 NOTE — Telephone Encounter (Signed)
Spoke to dtr and she is concerned with 24/7 care.  Home Health order entered.

## 2014-05-14 NOTE — Telephone Encounter (Signed)
Lvm for dtr to call back.

## 2014-05-14 NOTE — Telephone Encounter (Signed)
Patient will be getting out of rehab in about 4 weeks and the daughters are wondering about getting her some home health care. They are asking Dr. Asa Lente because she values her opinion.

## 2014-05-16 NOTE — Telephone Encounter (Signed)
Confirmed that [facility] will get the intitial order from [facility] for the assessment.

## 2014-05-21 ENCOUNTER — Telehealth: Payer: Self-pay | Admitting: *Deleted

## 2014-05-21 NOTE — Telephone Encounter (Addendum)
[name] called in reference to her sister [name].  She received a shoulder injection at last appt and now her knees are giving her a lot of pain.  She is asking if there is an appt available sooner than her scheduled 05/28/14 appt.  I have called back to tell her we have a few morning appts available on 05/25/14 and maybe one available tomorrow.  She will need to call us back first thing tomorrow morning to schedule (I had to leave a message.) Awaiting call back to be scheduled

## 2014-05-22 NOTE — Telephone Encounter (Signed)
I spoke with [facility] and they have decided to just leave the appt on 05/28/14

## 2014-05-24 ENCOUNTER — Encounter: Payer: Medicare Other | Attending: Physical Medicine & Rehabilitation

## 2014-05-24 ENCOUNTER — Ambulatory Visit (HOSPITAL_BASED_OUTPATIENT_CLINIC_OR_DEPARTMENT_OTHER): Payer: Medicare Other | Admitting: Physical Medicine & Rehabilitation

## 2014-05-24 ENCOUNTER — Encounter: Payer: Self-pay | Admitting: Physical Medicine & Rehabilitation

## 2014-05-24 VITALS — BP 133/67 | HR 96 | Resp 14

## 2014-05-24 DIAGNOSIS — M6289 Other specified disorders of muscle: Secondary | ICD-10-CM | POA: Diagnosis not present

## 2014-05-24 DIAGNOSIS — G8190 Hemiplegia, unspecified affecting unspecified side: Secondary | ICD-10-CM

## 2014-05-24 DIAGNOSIS — I82403 Acute embolism and thrombosis of unspecified deep veins of lower extremity, bilateral: Secondary | ICD-10-CM | POA: Diagnosis not present

## 2014-05-24 DIAGNOSIS — I639 Cerebral infarction, unspecified: Secondary | ICD-10-CM | POA: Diagnosis not present

## 2014-05-24 DIAGNOSIS — G8191 Hemiplegia, unspecified affecting right dominant side: Secondary | ICD-10-CM

## 2014-05-24 DIAGNOSIS — R4702 Dysphasia: Secondary | ICD-10-CM

## 2014-05-24 DIAGNOSIS — I69320 Aphasia following cerebral infarction: Secondary | ICD-10-CM | POA: Insufficient documentation

## 2014-05-24 DIAGNOSIS — M7541 Impingement syndrome of right shoulder: Secondary | ICD-10-CM | POA: Insufficient documentation

## 2014-05-24 DIAGNOSIS — IMO0002 Reserved for concepts with insufficient information to code with codable children: Secondary | ICD-10-CM

## 2014-05-24 DIAGNOSIS — R482 Apraxia: Secondary | ICD-10-CM | POA: Diagnosis not present

## 2014-05-24 DIAGNOSIS — R4701 Aphasia: Secondary | ICD-10-CM

## 2014-05-24 DIAGNOSIS — M1712 Unilateral primary osteoarthritis, left knee: Secondary | ICD-10-CM

## 2014-05-24 NOTE — Progress Notes (Signed)
Subjective:    Patient ID: Sandy Barrett, female    DOB: [birth year], 81 y.o.   MRN: [id number]  HPI 81 year old female with left MCA infarct with right hemiplegia as well as aphasia. Has been in skilled nursing facility after discharge from inpatient rehabilitation. Was having swelling in the right hand wrist last month diagnosed with reflex sympathetic dystrophy, went on prednisone burst and taper which was helpful for 2 or 3 weeks but had recurrence of pain. In addition had right shoulder injection. Shoulder injection also seem to be helpful. Or last couple weeks the patient has developed increasing left knee pain as well as increasing left hip pain. Patient's walking distance has diminished from 40 or 50 feet with the therapist to 0. Patient underwent x-rays 05/22/2014 left femur left pelvis showed moderate left hip degenerative joint changes. In addition left femur views were obtained including left knee which showed severe degenerative changes of the left knee.  No falls or trauma. Medications from skilled nursing facility were reviewed. Current pain medications include Tylenol 2 tablets every morning. Tramadol 1 tablet 3 morning and every evening Lidoderm patch on the left hip at night started 05/21/2014. Remains on Coumadin Pain Inventory Average Pain 9 Pain Right Now 6 My pain is intermittent, sharp, stabbing and aching  In the last 24 hours, has pain interfered with the following? General activity 8 Relation with others 0 Enjoyment of life 9 What TIME of day is your pain at its worst? daytime Sleep (in general) Fair  Pain is worse with: walking, bending and standing Pain improves with: rest and medication Relief from Meds: 5  Mobility ability to climb steps?  no do you drive?  no use a wheelchair needs help with transfers  Function retired I need assistance with the following:  feeding, dressing, bathing, toileting, meal prep, household duties and  shopping  Neuro/Psych bladder control problems bowel control problems weakness trouble walking confusion depression  Prior Studies Any changes since last visit?  no  Physicians involved in your care Any changes since last visit?  no   Family History  Problem Relation Age of Onset  . Asthma Mother   . Asthma Father   . Prostate cancer Father   . Stroke Brother    History   Social History  . Marital Status: Widowed    Spouse Name: N/A  . Number of Children: N/A  . Years of Education: N/A   Social History Main Topics  . Smoking status: Never Smoker   . Smokeless tobacco: Not on file  . Alcohol Use: Not on file  . Drug Use: Not on file  . Sexual Activity: No   Other Topics Concern  . None   Social History Narrative   Past Surgical History  Procedure Laterality Date  . Abdominal hysterectomy  1976  . Total hip arthroplasty  1998     right  . Tonsillectomy    . Breast surgery      biopsy  . Radiology with anesthesia Left 03/08/2014    Procedure: RADIOLOGY WITH ANESTHESIA;  Surgeon: Rob Hickman, MD;  Location: [place];  Service: Radiology;  Laterality: Left;   Past Medical History  Diagnosis Date  . HYPERTENSION   . HYPERTHYROIDISM   . OBESITY   . ARTHRITIS   . Posttraumatic stress disorder   . INSOMNIA   . MIGRAINE HEADACHE   . Hyperthyroidism     s/p I-131 ablation 03/2011 of multinod goiter  . Arthritis    BP  133/67 mmHg  Pulse 96  Resp 14  SpO2 95%  Opioid Risk Score:   Fall Risk Score: Moderate Fall Risk (6-13 points)`1  Depression screen PHQ 2/9  Depression screen [facility] 2/9 08/07/2013 12/24/2011  Decreased Interest 0 0  Down, Depressed, Hopeless 1 0  PHQ - 2 Score 1 0     Review of Systems  Gastrointestinal:       Bowel incontinence  Genitourinary:       Urinary incontinence  Musculoskeletal: Positive for gait problem.  Neurological: Positive for weakness.  Psychiatric/Behavioral: Positive for confusion and dysphoric mood.   All other systems reviewed and are negative.      Objective:   Physical Exam  Right upper extremity 0/5 at the deltoid, biceps, triceps, grip 3+ pitting edema left hand and forearm no ecchymosis No tenderness palpation over the forearm or the dorsum of the hand There is mild tenderness and pain when fingers are passively extended Ashworth grade 2 at the finger and wrist flexors of the right hand Right lower extremity trace hip knee extension does have apraxia limiting manual muscle testing Left hip no pain over the trochanteric bursa There is pain with internal and external rotation of the hip however difficult to say where the pain is. Patient does definitely have pain with left knee range of motion Left knee has mild to moderate effusion Tenderness bilaterally no leg swelling bilaterally no left upper extremity swelling  Patient has severe aphasia expressively Motor apraxia Also has receptive aphasia but does respond partially to visual cues      Assessment & Plan:  1. Left MCA infarct 3/3 With right hemiplegia, global aphasia, apraxia She is making slow improvements mainly with the right lower extremity strength and mobility.  Was increasing ambulation distance prior to an increase in left lower extremity pain, x-rays demonstrated osteoarthritis I suspect her  Decline in mobility is related to aggravation of what appears to be fairly severe arthritis and left knee and possibly left hip. We discussed that her evaluation is difficult secondary to aphasia.  2. Right shoulder hand syndrome, RSD variant post stroke, continue physical therapy, recommend prednisone burst and taper and milligram dose Take 4 tablets once a day for 3 days Take 3 tablets once a day for 3 days Take 2 tablets once a day for 3 days Take 1 tablet once a day for 3 days then discontinued   We discussed the possibility of DVT in the right upper extremity, she does have a history of bilateral lower extremity  DVT and is already therapeutic on Coumadin, so doubt if any change in treatment would occur  Continue antiedema technique such as glove elevation retrograde massage to the fingers and wrist  3.  Left knee pain with osteoarthritis noted, Severe, recent x-rays without fracture Would recommend knee injection  Knee injection   Indication:Left Knee pain not relieved by medication management and other conservative care.  Informed consent was obtained after describing risks and benefits of the procedure with the patient, this includes bleeding, bruising, infection and medication side effects. The patient wishes to proceed and has given written consent. The patient was placed in a recumbent position. The medial aspect of the knee was marked and prepped with Betadine and alcohol. It was then entered with a 27-gauge 1/2 inch needle and 1 mL of 1% lidocaine was injected into the skin and subcutaneous tissue. Then another 25 g 1.5inch needle was inserted into the knee joint. After negative draw back for blood, a  solution containing one ML of 6mg  per mL betamethasone and 3 mL of 1% lidocaine were injected. The patient tolerated the procedure well. Post procedure instructions were given.  Return to clinic one month  Over half of the 25 min visit was spent counseling and coordinating care. Patient was accompanied by her daughter and we discussed various questions related to patient's rotation and pain. Patient can continue the lidocaine patch Increase tramadol to 3 times a day Continue Tylenol

## 2014-05-28 ENCOUNTER — Ambulatory Visit: Payer: Medicare Other | Admitting: Physical Medicine & Rehabilitation

## 2014-05-28 ENCOUNTER — Other Ambulatory Visit: Payer: Self-pay | Admitting: Surgery

## 2014-05-28 ENCOUNTER — Ambulatory Visit: Payer: Medicare Other

## 2014-05-28 DIAGNOSIS — E049 Nontoxic goiter, unspecified: Secondary | ICD-10-CM | POA: Diagnosis not present

## 2014-05-28 DIAGNOSIS — M799 Soft tissue disorder, unspecified: Secondary | ICD-10-CM | POA: Diagnosis not present

## 2014-05-29 ENCOUNTER — Telehealth: Payer: Self-pay | Admitting: Endocrinology

## 2014-05-29 NOTE — Telephone Encounter (Signed)
Received records from [facility] Surgery forwarded via fax to Dr.Sean Loanne Drilling 05/29/14 fbg.

## 2014-05-31 ENCOUNTER — Ambulatory Visit (INDEPENDENT_AMBULATORY_CARE_PROVIDER_SITE_OTHER): Payer: Medicare Other | Admitting: Neurology

## 2014-05-31 ENCOUNTER — Encounter: Payer: Self-pay | Admitting: Neurology

## 2014-05-31 VITALS — BP 111/73 | HR 91 | Ht 65.0 in | Wt 164.9 lb

## 2014-05-31 DIAGNOSIS — I639 Cerebral infarction, unspecified: Secondary | ICD-10-CM

## 2014-05-31 DIAGNOSIS — G8110 Spastic hemiplegia affecting unspecified side: Secondary | ICD-10-CM

## 2014-05-31 NOTE — Progress Notes (Signed)
[facility] [address] [place]. [place] [address] [phone]       OFFICE FOLLOW-UP NOTE  [name] Date of Birth:  [birth year] Medical Record Number:  [id number]   HPI: 69 year African-American lady who seen today for first office follow-up visit following hospital admission for stroke on 03/08/14. She is accompanied by 2 of her daughters. Patient presented with sudden onset of speech difficulties, right-sided weakness and facial droop. Her last known well was unknown when she had talked to her sister on the phone and she went for had appointment and 1 PM and had is immediately noted that she was confused and having speech difficulties. She presented to the emergency room with aphasia and right hemiparesis and right hemianopsia. Last seen normal was outside the window for TPA upon initial determination hence CT scan with perfusion was obtained which showed a large ischemic penumbra in the left MCA territory. After several telephonic discussions with family members it was determined that the last seen normal  Determined initially was incorrect inpatient was within the timeframe for tPA which was given without complications.. Patient however had widened mediastinum on chest x-ray requiring a CT scan of the chest to rule out aortic dissection and she was subsequently found to have a large thyroid mass with bilateral pulmonary emboli. IV TPA was administered. She was found to have left terminal carotid artery occlusion for which she underwent unsuccessful attempt at revascularization with mechanical embolectomy by Dr. the mesh were due to technical difficulties vessel could not be opened. She was admitted to intensive care unit and was initially intubated. She remained globally aphasic with dense right hemiplegia and visual field loss. 2-D echo showed normal ejection fraction but there was presence of interatrial septal aneurysm but bubble study and TEE were not done. She was  also found to have extensive bilateral deep and thrombosis and pulmonary emboli and hence despite the large stroke was started cautiously on IV heparin without bolus. MRI scan confirmed subsequently a moderate size left MCA infarct with nonhemorrhagic transformation. There are also tiny punctate foci of right MCA infarcts. MRA showed occluded terminal left ICA and MCA. Carotid ultrasound showed no significant extracranial stenosis but abnormal left ICA waveform suggestive of distal occlusion. Patient was found to have a large intrathoracic goiter with displacement of the trachea, aortic branches and superior vena cava with sluggish flow in the left jugular vein but no thrombus. LDL cholesterol is 53 hemoglobin A1c was 5.4. Protein C and S activity were normal and presented, and was negative. Telemetry monitoring did not reveal atrial fibrillation. Patient was transferred for rehabilitation and is currently living in a nursing home where she is on warfarin which is tolerating well without any bleeding complications. She remains aphasic with dense right hemiplegia. She is able to assist with transfers and can stand up with the one-person assist with physical therapy and gait belt walk with little. The family is getting ready to transfer the patient home over the next few weeks and have a lot of questions about her care and prognosis which  I answered.  ROS:   14 system review of systems is positive for activity change, incontinence of bowels and bladder, joint and back pain, aching muscles, walking difficulty, skin wounds, memory loss, headache, speech difficulty and weakness.  PMH:  Past Medical History  Diagnosis Date  . HYPERTENSION   . HYPERTHYROIDISM   . OBESITY   . ARTHRITIS   . Posttraumatic stress disorder   .  INSOMNIA   . MIGRAINE HEADACHE   . Hyperthyroidism     s/p I-131 ablation 03/2011 of multinod goiter  . Arthritis   . Stroke     Social History:  History   Social History  .  Marital Status: Widowed    Spouse Name: N/A  . Number of Children: 2  . Years of Education: 14   Occupational History  . retired     Restaurant manager, fast food   Social History Main Topics  . Smoking status: Never Smoker   . Smokeless tobacco: Not on file  . Alcohol Use: No  . Drug Use: No  . Sexual Activity: No   Other Topics Concern  . Not on file   Social History Narrative   Widowed, lived alone prior to CVA, currently residing at [facility]   Right handed   Caffeine use- occasionally drinks tea    Medications:   Current Outpatient Prescriptions on File Prior to Visit  Medication Sig Dispense Refill  . acetaminophen (TYLENOL) 325 MG tablet Take 1-2 tablets (325-650 mg total) by mouth every 4 (four) hours as needed for mild pain.    [name] antiseptic oral rinse (CPC / CETYLPYRIDINIUM CHLORIDE 0.05%) 0.05 % LIQD solution 7 mLs by Mouth Rinse route 2 (two) times daily.  0  . iron polysaccharides (NIFEREX) 150 MG capsule Take 1 capsule (150 mg total) by mouth 2 (two) times daily before lunch and supper.    . lidocaine (LIDODERM) 5 % Place 1 patch onto the skin daily. Remove & Discard patch within 12 hours or as directed by MD    . Multiple Vitamins-Minerals (DECUBI-VITE) CAPS Take by mouth. Take 1 capsule by mouth daily for wound healing    . omeprazole (PRILOSEC) 20 MG capsule Take 20 mg by mouth daily. On an empty stomach    . Probiotic Product (ALIGN) 4 MG CAPS Take by mouth daily.      . traMADol (ULTRAM) 50 MG tablet Take 1 tablet (50 mg total) by mouth 4 (four) times daily as needed for moderate pain. 10 tablet 0  . traZODone (DESYREL) 50 MG tablet Take 1 tablet (50 mg total) by mouth at bedtime as needed for sleep. 30 tablet 1  . warfarin (COUMADIN) 2.5 MG tablet Take 2.5 mg by mouth daily.     No current facility-administered medications on file prior to visit.    Allergies:  No Known Allergies  Physical Exam General: Obese elderly African-American lady, seated, in no  evident distress Head: head normocephalic and atraumatic.  Neck: supple with no carotid or supraclavicular bruits Cardiovascular: regular rate and rhythm, no murmurs Musculoskeletal: no deformity. Right foot drop and wears foot brace and right hand brace Skin:  no rash/petichiae Vascular:  Normal pulses all extremities Filed Vitals:   05/31/14 0913  BP: 111/73  Pulse: 91   Neurologic Exam Mental Status: Awake and fully alert. Globally aphasic and will follow only occasional midline commands and mimicking. Speech is fluent but nonsensical. Will not follow any commands consistently.  Cranial Nerves: Fundoscopic exam reveals sharp disc margins. Pupils equal, briskly reactive to light. Extraocular movements full without nystagmus. Visual fields show dense right homonymous hemianopsia to threat Hearing intact. Facial sensation intact. Mild right lower facial weakness., tongue, palate moves normally and symmetrically.  Motor: Dense spastic right hemiplegia with 0/5 strength with right foot drop and hand and wrist flexion deformity. Increased tone on the right. Normal antigravity strength on the left. Sensory.: intact to touch ,pinprick .on  the left but impaired on the right  Coordination: Accurate on the left and cannot be tested on the right.  Gait and Station: Not tested as patient sitting in wheelchair and as per family requires one-person assist with physical therapy  Reflexes: 2+ and asymmetric much brisker on the right side Toes downgoing.   NIHSS  20 Modified Rankin  4  ASSESSMENT: 35 year lady with large left middle cerebral artery infarct secondary to occlusion of the left internal carotid artery with concurrent bilateral lower extremity deep and thrombosis and pulmonary embolism with strong suspicion for paradoxical emboli in March 2016 treated with IV TPA and failed mechanical thrombectomy with significant residual disability    PLAN: I had a long d/w patient and 2 daughters about  her recent stroke, risk for recurrent stroke/TIAs, personally independently reviewed imaging studies and stroke evaluation results and answered questions.Continue warfarin  for secondary stroke prevention given h/o recent DVT and PE and maintain strict control of hypertension with blood pressure goal below 130/90, diabetes with hemoglobin A1c goal below 6.5% and lipids with LDL cholesterol goal below 100 mg/dL.  Continue ongoing physical and occupational and speech therapy as well as transition to home therapies after she goes home. She also has a large thyroid mass and advised family to discuss this with her primary 30 home M.D. for treatment options. Return Followup in the future with me in 6 months or call earlier if necessary. This was a long visit lasting 30 minutes with greater than 50% time spent in counseling and coordination of care.     Note: This document was prepared with digital dictation and possible smart phrase technology. Any transcriptional errors that result from this process are unintentional

## 2014-05-31 NOTE — Patient Instructions (Signed)
I had a long d/w patient and 2 daughters about her recent stroke, risk for recurrent stroke/TIAs, personally independently reviewed imaging studies and stroke evaluation results and answered questions.Continue warfarin  for secondary stroke prevention given h/o recent DVT and PE and maintain strict control of hypertension with blood pressure goal below 130/90, diabetes with hemoglobin A1c goal below 6.5% and lipids with LDL cholesterol goal below 100 mg/dL.  Continue ongoing physical and occupational and speech therapy as well as transition to home therapies after she goes home. She also has a large thyroid mass and advise family to discuss this with her primary 77 home M.D. for treatment options. Return Followup in the future with me in 6 months or call earlier if necessary. This was a long visit lasting 30 minutes with greater than 50% time spent in counseling and coordination of care.

## 2014-06-05 ENCOUNTER — Other Ambulatory Visit: Payer: Self-pay | Admitting: *Deleted

## 2014-06-05 MED ORDER — TRAMADOL HCL 50 MG PO TABS: ORAL_TABLET | ORAL | Status: DC

## 2014-06-05 NOTE — Telephone Encounter (Signed)
[facility] 

## 2014-06-19 ENCOUNTER — Encounter: Payer: No Typology Code available for payment source | Attending: Physical Medicine & Rehabilitation

## 2014-06-19 ENCOUNTER — Ambulatory Visit (HOSPITAL_BASED_OUTPATIENT_CLINIC_OR_DEPARTMENT_OTHER): Payer: Medicare Other | Admitting: Physical Medicine & Rehabilitation

## 2014-06-19 ENCOUNTER — Encounter: Payer: Self-pay | Admitting: Physical Medicine & Rehabilitation

## 2014-06-19 DIAGNOSIS — I639 Cerebral infarction, unspecified: Secondary | ICD-10-CM

## 2014-06-19 DIAGNOSIS — G8190 Hemiplegia, unspecified affecting unspecified side: Secondary | ICD-10-CM | POA: Diagnosis not present

## 2014-06-19 DIAGNOSIS — M1710 Unilateral primary osteoarthritis, unspecified knee: Secondary | ICD-10-CM

## 2014-06-19 DIAGNOSIS — G8110 Spastic hemiplegia affecting unspecified side: Secondary | ICD-10-CM | POA: Diagnosis not present

## 2014-06-19 DIAGNOSIS — I69320 Aphasia following cerebral infarction: Secondary | ICD-10-CM | POA: Insufficient documentation

## 2014-06-19 DIAGNOSIS — R4702 Dysphasia: Secondary | ICD-10-CM | POA: Diagnosis not present

## 2014-06-19 DIAGNOSIS — R4701 Aphasia: Secondary | ICD-10-CM

## 2014-06-19 DIAGNOSIS — I63412 Cerebral infarction due to embolism of left middle cerebral artery: Secondary | ICD-10-CM | POA: Diagnosis not present

## 2014-06-19 DIAGNOSIS — M7541 Impingement syndrome of right shoulder: Secondary | ICD-10-CM | POA: Insufficient documentation

## 2014-06-19 DIAGNOSIS — M6289 Other specified disorders of muscle: Secondary | ICD-10-CM | POA: Insufficient documentation

## 2014-06-19 NOTE — Progress Notes (Signed)
Subjective:    Patient ID: Sandy Barrett, female    DOB: 21-Jun-1932, 81 y.o.   MRN: [id number] 81 year old female with left MCA infarct with right hemiplegia as well as aphasia. Has been in skilled nursing facility after discharge from inpatient rehabilitation. Was having swelling in the right hand wrist last month diagnosed with reflex sympathetic dystrophy, went on prednisone burst and taper which was helpful for 2 or 3 weeks but had recurrence of pain. In addition had right shoulder injection. Shoulder injection also seem to be helpful. Or last couple weeks the patient has developed increasing left knee pain as well as increasing left hip pain. Patient's walking distance has diminished from 40 or 50 feet with the therapist to 0. HPI  Has done better since she had the knee injection Also treated for reflex sympathetic dystrophy right upper extremity using prednisone burst and taper. Her left arm is also less painful Patient seems to indicate she is concerned about the right upper extremity. Daughter also is asking about the right upper extremity swelling. For a while her swelling went down. They have been trying to elevate the arm they've used a compression glove for a while but have not uses because of some blistering of the thumb. Some retrograde massage but this has not been consistent.  Patient will be going home next month. Home health therapies will be arranged by the skilled nursing facility Currently resides at [facility] Average Pain 6 Pain Right Now 6 My pain is intermittent  In the last 24 hours, has pain interfered with the following? General activity 0 Relation with others 0 Enjoyment of life 0 What TIME of day is your pain at its worst? daytime Sleep (in general) Good  Pain is worse with: some activites Pain improves with: rest Relief from Meds: no answer  Mobility walk with assistance use a walker use a wheelchair needs help with  transfers  Function disabled: date disabled . I need assistance with the following:  dressing, bathing, toileting, meal prep, household duties and shopping  Neuro/Psych trouble walking  Prior Studies Any changes since last visit?  no  Physicians involved in your care Any changes since last visit?  no   Family History  Problem Relation Age of Onset  . Asthma Mother   . Asthma Father   . Prostate cancer Father   . Stroke Brother    History   Social History  . Marital Status: Widowed    Spouse Name: N/A  . Number of Children: 2  . Years of Education: 14   Occupational History  . retired     Restaurant manager, fast food   Social History Main Topics  . Smoking status: Never Smoker   . Smokeless tobacco: Not on file  . Alcohol Use: No  . Drug Use: No  . Sexual Activity: No   Other Topics Concern  . None   Social History Narrative   Widowed, lived alone prior to CVA, currently residing at [facility]   Right handed   Caffeine use- occasionally drinks tea   Past Surgical History  Procedure Laterality Date  . Abdominal hysterectomy  1976  . Total hip arthroplasty  1998     right  . Tonsillectomy    . Breast surgery      biopsy  . Radiology with anesthesia Left 03/08/2014    Procedure: RADIOLOGY WITH ANESTHESIA;  Surgeon: Rob Hickman, MD;  Location: [place] [facility];  Service: Radiology;  Laterality: Left;  Past Medical History  Diagnosis Date  . HYPERTENSION   . HYPERTHYROIDISM   . OBESITY   . ARTHRITIS   . Posttraumatic stress disorder   . INSOMNIA   . MIGRAINE HEADACHE   . Hyperthyroidism     s/p I-131 ablation 03/2011 of multinod goiter  . Arthritis   . Stroke    BP 134/64 mmHg  Pulse 98  Resp 16  SpO2 96%  Opioid Risk Score:   Fall Risk Score: High Fall Risk (>13 points) (previously educated and given handout.   Resided in [facility])`1  Depression screen [facility] 2/9  Depression screen [facility] 2/9 08/07/2013 12/24/2011  Decreased Interest 0 0   Down, Depressed, Hopeless 1 0  PHQ - 2 Score 1 0     Review of Systems  Musculoskeletal: Positive for gait problem.  All other systems reviewed and are negative.      Objective:   Physical Exam  Constitutional: She appears well-developed and well-nourished.  HENT:  Head: Normocephalic and atraumatic.  Eyes: Conjunctivae and EOM are normal. Pupils are equal, round, and reactive to light.  Musculoskeletal:  3+ edema dorsum of the right hand as well as right forearm. Mild pain with finger wrist and elbow range of motion. No pain with MCP compression no skin discoloration mild pain with shoulder external rotation.  Neurological: She is alert.  Severe a aphasia Right upper extremity essentially 0/5 at the deltoid, biceps, triceps, grip  Right lower extremity to minus hip extensor knee extensor synergy otherwise 0  Psychiatric: She has a normal mood and affect.  Nursing note and vitals reviewed.  Right upper extremity Ashworth grade 2 spasticity and finger wrist flexor and 3 at the pronator       Assessment & Plan:  1. Left MCA infarct with a aphasia as well as right spastic hemiplegia. Recommend continue SNF level PTOT speech At this point appears to have too much edema to perform Botox unless it is done under ultrasound guidance. In any case her spasticity is only moderate at this point and continued therapy should be the main treatment. Right upper extremity edema needs to be managed with medical grade massage as well as elevation. Discussed this with her daughter.  Patient is developing some painful dysesthesia right upper greater than right lower extremity will initiate gabapentin low-dose 100 mg at night  Return to clinic in about 2 months at that time she should be ready to transition to outpatient therapy  Pending on home health therapy progress we'll also consider Botox at that time  2. Osteoarthritis left knee improved after injection may need repeat injection at next  visit and not a good candidate for nonsteroidal anti-inflammatories given recent stroke

## 2014-07-06 ENCOUNTER — Non-Acute Institutional Stay (SKILLED_NURSING_FACILITY): Payer: Medicare Other | Admitting: Adult Health

## 2014-07-06 ENCOUNTER — Telehealth: Payer: Self-pay

## 2014-07-06 ENCOUNTER — Encounter: Payer: Self-pay | Admitting: Adult Health

## 2014-07-06 DIAGNOSIS — K219 Gastro-esophageal reflux disease without esophagitis: Secondary | ICD-10-CM

## 2014-07-06 DIAGNOSIS — I2699 Other pulmonary embolism without acute cor pulmonale: Secondary | ICD-10-CM

## 2014-07-06 DIAGNOSIS — G43009 Migraine without aura, not intractable, without status migrainosus: Secondary | ICD-10-CM

## 2014-07-06 DIAGNOSIS — M150 Primary generalized (osteo)arthritis: Secondary | ICD-10-CM | POA: Diagnosis not present

## 2014-07-06 DIAGNOSIS — G629 Polyneuropathy, unspecified: Secondary | ICD-10-CM | POA: Diagnosis not present

## 2014-07-06 DIAGNOSIS — I6340 Cerebral infarction due to embolism of unspecified cerebral artery: Secondary | ICD-10-CM | POA: Diagnosis not present

## 2014-07-06 DIAGNOSIS — K5900 Constipation, unspecified: Secondary | ICD-10-CM | POA: Diagnosis not present

## 2014-07-06 DIAGNOSIS — G4700 Insomnia, unspecified: Secondary | ICD-10-CM | POA: Diagnosis not present

## 2014-07-06 DIAGNOSIS — E042 Nontoxic multinodular goiter: Secondary | ICD-10-CM | POA: Diagnosis not present

## 2014-07-06 DIAGNOSIS — I10 Essential (primary) hypertension: Secondary | ICD-10-CM

## 2014-07-06 DIAGNOSIS — I82403 Acute embolism and thrombosis of unspecified deep veins of lower extremity, bilateral: Secondary | ICD-10-CM

## 2014-07-06 DIAGNOSIS — I639 Cerebral infarction, unspecified: Secondary | ICD-10-CM

## 2014-07-06 DIAGNOSIS — M8949 Other hypertrophic osteoarthropathy, multiple sites: Secondary | ICD-10-CM

## 2014-07-06 DIAGNOSIS — M159 Polyosteoarthritis, unspecified: Secondary | ICD-10-CM

## 2014-07-06 NOTE — Telephone Encounter (Signed)
Sandy Barrett [phone]) with Burman Nieves stating that pt is being released from [facility] on July 9th (Saturday) and needed verbal orders for [facility] to start on Sunday 07/15/14. [place] has entered the orders for Skilled Nursing, PT and OT.   Called Christy back and gave verbal okay. Informed that paperwork would be signed and faxed back as soon as we receive it.

## 2014-07-08 NOTE — Progress Notes (Signed)
Patient ID: Sandy Barrett, female   DOB: 1932/06/25, 81 y.o.   MRN: [id number]   07/06/14  Facility:  Nursing Home Location:  [place] Room Number: 1205-P LEVEL OF CARE:  SNF (31)   Chief Complaint  Patient presents with  . Discharge Note    Embolic stroke, hypertension, DVT, pulmonary embolism, osteoarthritis, insomnia, constipation and GERD    HISTORY OF PRESENT ILLNESS:  This is a 81 year old female who is for discharge home with Home health PT for transfers, balance, LE strengthening, wheelcair mobility and gait training; OT for toileting, shower-chair transfers, bathing and dressing; ST for receptive and expressive language and functional communication; Home health aide for ADLs and bathing; and Nursing for Coumadin monitoring, wound care and disease management. DME:  Semi-electric hospital bed with half rails, 18" X 18 "hemi-standard wheelchair with elevating leg rests, anti-tippers, drop arms, pressure relieving gel cushion, lap (half)  tray right side, sliding board and gel overlay for hospital bed.  She has been admitted to [facility] on 04/05/14 from [facility]. She has PMH of hypertension, hyperlipidemia, hyper thyroid disease in, osteoarthritis and migraine. She presented to [facility] with speech difficulty, right sided weakness and facial droop. She was then transferred to [facility] for stroke workup. CT showed left MCA sign. She is aphasic, has right hemiparesis and right hemianopsia. CT chest showed a large thyroid mass and bilateral PE. tPA was administered. Minimal improvement. She was also found to have bilateral lower extremity DVT for which she was started on heparin drip and switched to Coumadin.  Patient was admitted to this facility for short-term rehabilitation after the patient's recent hospitalization.  Patient has completed SNF rehabilitation and therapy has cleared the patient for discharge.   PAST  MEDICAL HISTORY:  Past Medical History  Diagnosis Date  . HYPERTENSION   . HYPERTHYROIDISM   . OBESITY   . ARTHRITIS   . Posttraumatic stress disorder   . INSOMNIA   . MIGRAINE HEADACHE   . Hyperthyroidism     s/p I-131 ablation 03/2011 of multinod goiter  . Arthritis   . Stroke     CURRENT MEDICATIONS: Reviewed per MAR/see medication list  No Known Allergies   REVIEW OF SYSTEMS:  Unable to obtain due to being aphasic  PHYSICAL EXAMINATION  GENERAL: no acute distress, normal body habitus NECK: supple, trachea midline, no neck masses, no thyroid tenderness, no thyromegaly LYMPHATICS: no LAN in the neck, no supraclavicular LAN RESPIRATORY: breathing is even & unlabored, BS CTAB CARDIAC: RRR, no murmur,no extra heart sounds, RUE edema 2+  GI: abdomen soft, normal BS, no masses, no tenderness, no hepatomegaly, no splenomegaly EXTREMITIES:  Right sided weakness; able to move LUE and LLE;  NEURO:  Aphasic; right sided hemiplegia PSYCHIATRIC: affect & behavior appropriate  LABS/RADIOLOGY: Labs reviewed: 06/11/14  TSH 1.041 05/23/14  WBC 4.7 hemoglobin 12.1 hematocrit 38.1 MCV 89.0 platelet 587 05/22/14  x-ray of left femur/pelvis shows no acute fracture or dislocation 04/10/14  sodium 142 potassium 4.5 glucose 79 BUN 9 creatinine 0.39 total bilirubin 0.5 alkaline phosphatase 68 SGOT 13 SGPT 9 total protein 5.6 albumin 3.2 calcium 9.2 WBC 6.9 hemoglobin 11.5 hematocrit 37.2 MCV 28.7 Basic Metabolic Panel:  Recent Labs  03/09/14 0450 03/10/14 0219  03/21/14 0705 03/28/14 1010 04/04/14 1059  NA 143  142 144  < > 142 142 141  K 3.1*  3.0* 2.8*  < > 3.9 4.0 4.2  CL 108  109  110  < > 109 108 106  CO2 25  26 26   < > 25 26 28   GLUCOSE 134*  137* 118*  < > 89 80 93  BUN 8  7 7   < > 8 8 10   CREATININE 0.55  0.53 0.57  < > 0.40* 0.45* 0.49*  CALCIUM 8.3*  8.2* 8.1*  < > 8.9 9.5 9.4  MG 1.8 1.9  --   --   --   --   PHOS 4.2  4.2  --   --   --   --   --   < > = values in  this interval not displayed. Liver Function Tests:  Recent Labs  08/07/13 1059 03/08/14 1448 03/09/14 0450 03/14/14 0423  AST 15 50*  --  17  ALT 7 18  --  10  ALKPHOS 59 63  --  48  BILITOT 0.7 1.8*  --  1.3*  PROT 6.8 6.4  --  4.6*  ALBUMIN 3.6 3.3* 2.5* 2.4*   CBC:  Recent Labs  08/07/13 1059 03/08/14 1448  03/14/14 0423  03/31/14 0845 04/03/14 0559 04/05/14 0724  WBC 6.4 8.7  < > 8.6  < > 7.4 7.1 6.2  NEUTROABS 4.7 6.6  --  6.7  --   --   --   --   HGB 12.8 12.9  < > 8.8*  < > 10.6* 10.1* 10.5*  HCT 39.1 40.4  < > 27.8*  < > 35.3* 33.0* 33.9*  MCV 92.7 94.6  < > 94.2  < > 97.2 95.9 95.8  PLT 540.0* 409*  < > 509*  < > 618* 693* 707*  < > = values in this interval not displayed.  Lipid Panel:  Recent Labs  08/07/13 1059 03/09/14 0450  HDL 51.20 31*   CBG:  Recent Labs  03/13/14 1621 03/13/14 2111 03/14/14 0648  GLUCAP 96 108* 116*   US Soft Tissue Head/neck  05/09/2014   CLINICAL DATA:  Follow-up nodule  EXAM: THYROID ULTRASOUND  TECHNIQUE: Ultrasound examination of the thyroid gland and adjacent soft tissues was performed.  COMPARISON:  09/02/2012  FINDINGS: Right thyroid lobe  Measurements: 6.6 x 2.9 x 3.6 cm. Solid lower pole nodule measures 2.3 x 2.3 x 2.8 cm and previously measured 3.0 x 1.9 x 2.2 cm. Smaller upper pole nodules are also present. The largest measures 1.1 cm.  Left thyroid lobe  Measurements: 9.1 x 5.5 x 5.7 cm. Left lobe is heterogeneous. There is a lower pole mass measuring 5.0 x 5.6 cm. The sagittal measurement cannot be measured because the inferior aspect is obscured.  Isthmus  Thickness: 1.5 cm.  No nodules visualized.  Lymphadenopathy  None visualized.  Additional findings  There is heterogeneous signal in the right internal jugular vein. The vein was not compressed, therefore DVT cannot be confirmed by this study.  IMPRESSION: Bilateral thyroid nodules. There is a new nodule identified in the lower pole of the left lobe. It measures  5.5 cm. Findings meet consensus criteria for biopsy. Ultrasound-guided fine needle aspiration should be considered, as per the consensus statement: Management of Thyroid Nodules Detected at [place]: Society of Radiologists in [place]. Radiology 2005; [id number].  There is heterogeneous signal in the right internal jugular vein. This may represent DVT or slow flow an was noted on the prior study. If DVT is suspected, dedicated Doppler study can be performed.   Electronically Signed   By: Rodena Goldmann.D.  On: 05/09/2014 12:34    ASSESSMENT/PLAN:  Embolic stroke - for home health PT, OT, ST,  Nursing and CNA; follows-up with Dr. Leonie Man, neurology Hypertension - continue propranolol 20 mg by mouth twice a day DVT/PE - continue Coumadin 3.5 mg by mouth daily Long-term use of anticoagulant - INR 1.9, subtherapeutic; continue Coumadin 3.5 mg PO Q D and repeat INR on 07/10/14 Osteoarthritis - continue Voltaren gel, Mobic, Lidoderm patch and tramadol 50 mg by mouth QID Constipation - continue senna S2 tabs by mouth daily at bedtime Insomnia - continue trazodone 50 mg by mouth every 7:30 PM Multinodular goiter - TSH 1.041; follows-up with general surgery, Dr. Harlow Asa Neuropathy - recently started on Gabapentin 100 mg PO BID Migraine - continue Propanolol 10 mg 1 tab PO Q D PRN GERD - continue Prilosec 20 mg PO Q D   I have filled out patient's discharge paperwork and written prescriptions.  Patient will receive home health PT, OT, ST, Nursing and CNA.  DME provided:  Semi-electric hospital bed with half rails, 18" X 18 "hemi-standard wheelchair with elevating leg rests, anti-tippers, drop arms, pressure relieving gel cushion, lap (half)  tray right side, sliding board and gel overlay for hospital bed  Total discharge time: Greater than 30 minutes  Discharge time involved coordination of the discharge process with social worker, nursing staff and therapy department. Medical  justification for home health services/DME verified.    [facility], NP [employer] [phone]

## 2014-07-13 ENCOUNTER — Encounter: Payer: Self-pay | Admitting: Internal Medicine

## 2014-07-15 DIAGNOSIS — I69351 Hemiplegia and hemiparesis following cerebral infarction affecting right dominant side: Secondary | ICD-10-CM | POA: Diagnosis not present

## 2014-07-15 DIAGNOSIS — I69320 Aphasia following cerebral infarction: Secondary | ICD-10-CM | POA: Diagnosis not present

## 2014-07-16 ENCOUNTER — Telehealth: Payer: Self-pay | Admitting: Internal Medicine

## 2014-07-16 DIAGNOSIS — I69351 Hemiplegia and hemiparesis following cerebral infarction affecting right dominant side: Secondary | ICD-10-CM | POA: Diagnosis not present

## 2014-07-16 DIAGNOSIS — I69320 Aphasia following cerebral infarction: Secondary | ICD-10-CM | POA: Diagnosis not present

## 2014-07-16 NOTE — Telephone Encounter (Signed)
Pt was dc from nursing facility [facility]) and dc to home. HH has dc orders to do coumadin and cbc. Vinnie Level needed number to send results and also a verbal okay for speech. Fax number given to send results and verbal okay for speech also given.

## 2014-07-16 NOTE — Telephone Encounter (Signed)
Santiago Glad, Home health nurse. [phone]  Questions about an order, can you call her when you get a chance?

## 2014-07-17 ENCOUNTER — Telehealth: Payer: Self-pay | Admitting: Internal Medicine

## 2014-07-17 ENCOUNTER — Ambulatory Visit (INDEPENDENT_AMBULATORY_CARE_PROVIDER_SITE_OTHER): Payer: Medicare Other | Admitting: General Practice

## 2014-07-17 DIAGNOSIS — I69351 Hemiplegia and hemiparesis following cerebral infarction affecting right dominant side: Secondary | ICD-10-CM | POA: Diagnosis not present

## 2014-07-17 DIAGNOSIS — I639 Cerebral infarction, unspecified: Secondary | ICD-10-CM

## 2014-07-17 DIAGNOSIS — I63311 Cerebral infarction due to thrombosis of right middle cerebral artery: Secondary | ICD-10-CM

## 2014-07-17 DIAGNOSIS — Z5181 Encounter for therapeutic drug level monitoring: Secondary | ICD-10-CM | POA: Insufficient documentation

## 2014-07-17 DIAGNOSIS — I69320 Aphasia following cerebral infarction: Secondary | ICD-10-CM | POA: Diagnosis not present

## 2014-07-17 DIAGNOSIS — I2699 Other pulmonary embolism without acute cor pulmonale: Secondary | ICD-10-CM

## 2014-07-17 DIAGNOSIS — I82403 Acute embolism and thrombosis of unspecified deep veins of lower extremity, bilateral: Secondary | ICD-10-CM

## 2014-07-17 LAB — POCT INR
INR: 2
INR: 2 — AB (ref 0.9–1.1)

## 2014-07-17 LAB — PROTIME-INR: Protime: 23.7 seconds — AB (ref 10.0–13.8)

## 2014-07-17 NOTE — Progress Notes (Signed)
I have reviewed and agree with the plan. 

## 2014-07-17 NOTE — Telephone Encounter (Signed)
Ok - please let coumadin clinic know same for further instruction

## 2014-07-17 NOTE — Progress Notes (Signed)
Pre visit review using our clinic review tool, if applicable. No additional management support is needed unless otherwise documented below in the visit note. 

## 2014-07-17 NOTE — Telephone Encounter (Signed)
Patients home health nurse Vinnie Level to let you know her pt is 23.7 and her INR is 2 Please call her at [phone]

## 2014-07-18 DIAGNOSIS — I69351 Hemiplegia and hemiparesis following cerebral infarction affecting right dominant side: Secondary | ICD-10-CM | POA: Diagnosis not present

## 2014-07-18 DIAGNOSIS — I69320 Aphasia following cerebral infarction: Secondary | ICD-10-CM | POA: Diagnosis not present

## 2014-07-19 DIAGNOSIS — I69351 Hemiplegia and hemiparesis following cerebral infarction affecting right dominant side: Secondary | ICD-10-CM | POA: Diagnosis not present

## 2014-07-19 DIAGNOSIS — I69320 Aphasia following cerebral infarction: Secondary | ICD-10-CM | POA: Diagnosis not present

## 2014-07-20 ENCOUNTER — Telehealth: Payer: Self-pay | Admitting: Internal Medicine

## 2014-07-20 DIAGNOSIS — I69351 Hemiplegia and hemiparesis following cerebral infarction affecting right dominant side: Secondary | ICD-10-CM | POA: Diagnosis not present

## 2014-07-20 DIAGNOSIS — I69320 Aphasia following cerebral infarction: Secondary | ICD-10-CM | POA: Diagnosis not present

## 2014-07-20 NOTE — Telephone Encounter (Signed)
Can you please call Vinnie Level from [place] asap. [phone]. She would like to hear from you before noon. Its regarding her getting a CBC done. I think she had orders to do it because of blood in her stool. There is no longer blood in her stool and if it would be ok to do her lab work when she comes in next week to see Marya Amsler.

## 2014-07-20 NOTE — Telephone Encounter (Signed)
Original plan of care is skin integrity training and prevention of pressure ulcer/ adaptive equip training/ ADL/ modification to right hand splint for 07/17/2014. freq was 1x/week for 6 weeks.  Due to injury to therapist, requesting OK to move 1 visit from w/o 07/16/2014 to the end of the regimen, being w/o 08/19/2014.   No call back necessary

## 2014-07-23 DIAGNOSIS — I69320 Aphasia following cerebral infarction: Secondary | ICD-10-CM | POA: Diagnosis not present

## 2014-07-23 DIAGNOSIS — I69351 Hemiplegia and hemiparesis following cerebral infarction affecting right dominant side: Secondary | ICD-10-CM | POA: Diagnosis not present

## 2014-07-24 ENCOUNTER — Ambulatory Visit (INDEPENDENT_AMBULATORY_CARE_PROVIDER_SITE_OTHER): Payer: Medicare Other | Admitting: General Practice

## 2014-07-24 DIAGNOSIS — I69320 Aphasia following cerebral infarction: Secondary | ICD-10-CM | POA: Diagnosis not present

## 2014-07-24 DIAGNOSIS — I69351 Hemiplegia and hemiparesis following cerebral infarction affecting right dominant side: Secondary | ICD-10-CM | POA: Diagnosis not present

## 2014-07-24 DIAGNOSIS — I639 Cerebral infarction, unspecified: Secondary | ICD-10-CM

## 2014-07-24 DIAGNOSIS — I63311 Cerebral infarction due to thrombosis of right middle cerebral artery: Secondary | ICD-10-CM

## 2014-07-24 DIAGNOSIS — I82403 Acute embolism and thrombosis of unspecified deep veins of lower extremity, bilateral: Secondary | ICD-10-CM

## 2014-07-24 DIAGNOSIS — Z5181 Encounter for therapeutic drug level monitoring: Secondary | ICD-10-CM

## 2014-07-24 DIAGNOSIS — I2699 Other pulmonary embolism without acute cor pulmonale: Secondary | ICD-10-CM

## 2014-07-24 LAB — POCT INR: INR: 1.7

## 2014-07-24 NOTE — Progress Notes (Signed)
Pre visit review using our clinic review tool, if applicable. No additional management support is needed unless otherwise documented below in the visit note. 

## 2014-07-24 NOTE — Progress Notes (Signed)
I have reviewed and agree with the plan. 

## 2014-07-25 ENCOUNTER — Ambulatory Visit: Payer: Medicare Other | Admitting: Family

## 2014-07-25 DIAGNOSIS — I69320 Aphasia following cerebral infarction: Secondary | ICD-10-CM | POA: Diagnosis not present

## 2014-07-25 DIAGNOSIS — I69351 Hemiplegia and hemiparesis following cerebral infarction affecting right dominant side: Secondary | ICD-10-CM | POA: Diagnosis not present

## 2014-07-26 ENCOUNTER — Telehealth: Payer: Self-pay | Admitting: Internal Medicine

## 2014-07-26 ENCOUNTER — Ambulatory Visit (INDEPENDENT_AMBULATORY_CARE_PROVIDER_SITE_OTHER): Payer: Medicare Other | Admitting: Internal Medicine

## 2014-07-26 ENCOUNTER — Ambulatory Visit: Payer: Medicare Other | Admitting: Family

## 2014-07-26 ENCOUNTER — Encounter: Payer: Self-pay | Admitting: Internal Medicine

## 2014-07-26 VITALS — BP 130/82 | HR 92 | Temp 98.7°F | Resp 16 | Ht 65.0 in

## 2014-07-26 DIAGNOSIS — I69351 Hemiplegia and hemiparesis following cerebral infarction affecting right dominant side: Secondary | ICD-10-CM | POA: Diagnosis not present

## 2014-07-26 DIAGNOSIS — I10 Essential (primary) hypertension: Secondary | ICD-10-CM | POA: Diagnosis not present

## 2014-07-26 DIAGNOSIS — D510 Vitamin B12 deficiency anemia due to intrinsic factor deficiency: Secondary | ICD-10-CM

## 2014-07-26 DIAGNOSIS — I69320 Aphasia following cerebral infarction: Secondary | ICD-10-CM | POA: Diagnosis not present

## 2014-07-26 DIAGNOSIS — H9190 Unspecified hearing loss, unspecified ear: Secondary | ICD-10-CM | POA: Insufficient documentation

## 2014-07-26 DIAGNOSIS — M150 Primary generalized (osteo)arthritis: Secondary | ICD-10-CM | POA: Diagnosis not present

## 2014-07-26 DIAGNOSIS — G4700 Insomnia, unspecified: Secondary | ICD-10-CM

## 2014-07-26 DIAGNOSIS — H543 Unqualified visual loss, both eyes: Secondary | ICD-10-CM

## 2014-07-26 DIAGNOSIS — I82403 Acute embolism and thrombosis of unspecified deep veins of lower extremity, bilateral: Secondary | ICD-10-CM

## 2014-07-26 DIAGNOSIS — H9193 Unspecified hearing loss, bilateral: Secondary | ICD-10-CM

## 2014-07-26 DIAGNOSIS — M8949 Other hypertrophic osteoarthropathy, multiple sites: Secondary | ICD-10-CM

## 2014-07-26 DIAGNOSIS — I639 Cerebral infarction, unspecified: Secondary | ICD-10-CM

## 2014-07-26 DIAGNOSIS — R7989 Other specified abnormal findings of blood chemistry: Secondary | ICD-10-CM

## 2014-07-26 DIAGNOSIS — H542 Low vision, both eyes: Secondary | ICD-10-CM

## 2014-07-26 DIAGNOSIS — R946 Abnormal results of thyroid function studies: Secondary | ICD-10-CM

## 2014-07-26 DIAGNOSIS — M5489 Other dorsalgia: Secondary | ICD-10-CM

## 2014-07-26 DIAGNOSIS — M159 Polyosteoarthritis, unspecified: Secondary | ICD-10-CM | POA: Insufficient documentation

## 2014-07-26 DIAGNOSIS — I6340 Cerebral infarction due to embolism of unspecified cerebral artery: Secondary | ICD-10-CM

## 2014-07-26 MED ORDER — TRAMADOL HCL 50 MG PO TABS: ORAL_TABLET | ORAL | Status: DC

## 2014-07-26 MED ORDER — GABAPENTIN 100 MG PO CAPS: 100.0000 mg | ORAL_CAPSULE | Freq: Three times a day (TID) | ORAL | Status: DC

## 2014-07-26 MED ORDER — TRAZODONE HCL 50 MG PO TABS: 50.0000 mg | ORAL_TABLET | Freq: Every evening | ORAL | Status: DC | PRN

## 2014-07-26 MED ORDER — DICLOFENAC SODIUM 1 % TD GEL: 2.0000 g | Freq: Three times a day (TID) | TRANSDERMAL | Status: DC

## 2014-07-26 MED ORDER — OMEPRAZOLE 20 MG PO CPDR: 20.0000 mg | DELAYED_RELEASE_CAPSULE | Freq: Every day | ORAL | Status: DC

## 2014-07-26 MED ORDER — WARFARIN SODIUM 1 MG PO TABS: 4.0000 mg | ORAL_TABLET | Freq: Once | ORAL | Status: DC

## 2014-07-26 NOTE — Telephone Encounter (Signed)
disregard

## 2014-07-26 NOTE — Telephone Encounter (Signed)
Spoke to [employer] with [facility].  He is requesting verbal okay to add edema massage to OT. Entire right extremity from finger, hand, wrist and proximal antecubital.

## 2014-07-26 NOTE — Progress Notes (Signed)
Subjective:  Patient ID: Sandy Barrett, female    DOB: 05-27-1932  Age: 81 y.o. MRN: [id number]  CC: Anemia   HPI [name] presents for for follow-up after recent CVA. She was hypercoagulable and had a DVT, pulmonary embolus, thrombosis and a left-sided cerebrovascular accident. She was treated in the hospital and has been in rehabilitation for several months. She is transitioning to home and comes in for follow-up and prescription refills. She has a aphasia that is improving. She has a dense right hemiparesis. She is having trouble swallowing but that is improving as well. She is currently cared for by her daughter. Her anticoagulation is being followed by [facility]. She tells me her current dose is 3.5 mg a day. She has chronic back and hip pain. She was anemic at the time of discharge from the hospital. It does not appear that this anemia has been evaluated.  Outpatient Prescriptions Prior to Visit  Medication Sig Dispense Refill  . acetaminophen (TYLENOL) 325 MG tablet Take 1-2 tablets (325-650 mg total) by mouth every 4 (four) hours as needed for mild pain.    [name] antiseptic oral rinse (CPC / CETYLPYRIDINIUM CHLORIDE 0.05%) 0.05 % LIQD solution 7 mLs by Mouth Rinse route 2 (two) times daily.  0  . iron polysaccharides (NIFEREX) 150 MG capsule Take 1 capsule (150 mg total) by mouth 2 (two) times daily before lunch and supper.    . Multiple Vitamins-Minerals (DECUBI-VITE) CAPS Take by mouth. Take 1 capsule by mouth daily for wound healing    . Probiotic Product (ALIGN) 4 MG CAPS Take by mouth daily.      . propranolol (INDERAL) 10 MG tablet Take 10 mg by mouth as needed.    . senna-docusate (SENOKOT-S) 8.6-50 MG per tablet Take 2 tablets by mouth at bedtime.    . ciclopirox (PENLAC) 8 % solution Apply topically at bedtime. Apply over nail and surrounding skin. Apply daily over previous coat. After seven (7) days, may remove with alcohol and continue cycle.    . diclofenac sodium  (VOLTAREN) 1 % GEL Apply topically 3 (three) times daily.    [name] lidocaine (LIDODERM) 5 % Place 1 patch onto the skin daily. Remove & Discard patch within 12 hours or as directed by MD    . omeprazole (PRILOSEC) 20 MG capsule Take 20 mg by mouth daily. On an empty stomach    . predniSONE (DELTASONE) 10 MG tablet Take 10 mg by mouth daily with breakfast.    . traMADol (ULTRAM) 50 MG tablet Take one tablet by mouth three times daily for pain; Take one tablet by mouth four times daily as needed for moderate pain 210 tablet 5  . traZODone (DESYREL) 50 MG tablet Take 1 tablet (50 mg total) by mouth at bedtime as needed for sleep. 30 tablet 1  . warfarin (COUMADIN) 2.5 MG tablet Take 2.5 mg by mouth daily.     No facility-administered medications prior to visit.    ROS Review of Systems  Constitutional: Positive for activity change and fatigue. Negative for fever, chills, diaphoresis and unexpected weight change.  HENT: Positive for hearing loss (she has had decreased hearing since the stroke.) and trouble swallowing. Facial swelling: she has had decreased vision since his stroke.   Eyes: Positive for visual disturbance ( She has had decreased vision since the stroke.).  Respiratory: Negative.  Negative for cough, choking, chest tightness, shortness of breath and stridor.   Cardiovascular: Negative.  Negative for chest pain, palpitations and  leg swelling.  Gastrointestinal: Negative.  Negative for nausea, vomiting, abdominal pain, diarrhea, constipation and blood in stool.  Endocrine: Negative.   Genitourinary: Negative.  Negative for difficulty urinating.  Musculoskeletal: Positive for back pain, arthralgias and gait problem. Negative for myalgias, joint swelling, neck pain and neck stiffness.  Skin: Negative.   Allergic/Immunologic: Negative.   Neurological: Positive for facial asymmetry, weakness and numbness. Negative for dizziness, tremors, seizures, syncope, speech difficulty, light-headedness  and headaches.  Hematological: Negative.   Psychiatric/Behavioral: Negative.     Objective:  BP 130/82 mmHg  Pulse 92  Temp(Src) 98.7 F (37.1 C) (Oral)  Resp 16  Ht 5\' 5"  (1.651 m)  SpO2 92%  BP Readings from Last 3 Encounters:  07/26/14 130/82  07/06/14 116/79  06/19/14 134/64    Wt Readings from Last 3 Encounters:  07/06/14 171 lb 12.8 oz (77.928 kg)  05/31/14 164 lb 14.4 oz (74.798 kg)  05/09/14 164 lb 9.6 oz (74.662 kg)    Physical Exam  Constitutional: She appears well-developed and well-nourished.  Non-toxic appearance. She has a sickly appearance. She appears ill. No distress.  HENT:  Mouth/Throat: Oropharynx is clear and moist. No oropharyngeal exudate.  Eyes: Conjunctivae are normal. Right eye exhibits no discharge. Left eye exhibits no discharge. No scleral icterus.  Neck: Normal range of motion. Neck supple. No JVD present. No tracheal deviation present. No thyromegaly present.  Cardiovascular: Normal rate, regular rhythm, normal heart sounds and intact distal pulses.  Exam reveals no gallop and no friction rub.   No murmur heard. Pulmonary/Chest: Effort normal and breath sounds normal. No stridor. No respiratory distress. She has no wheezes. She has no rales. She exhibits no tenderness.  Abdominal: Soft. Bowel sounds are normal. She exhibits no distension. There is no tenderness. There is no rebound and no guarding.  Musculoskeletal: Normal range of motion. She exhibits no edema or tenderness.       Right forearm: She exhibits deformity (there is a lipoma on her right forearm.). She exhibits no tenderness, no bony tenderness, no swelling, no edema and no laceration.  Lymphadenopathy:    She has no cervical adenopathy.  Neurological: She is alert. She displays atrophy. She displays no tremor. A cranial nerve deficit and sensory deficit is present. She exhibits abnormal muscle tone. She displays no seizure activity. Coordination and gait abnormal.  She is  wheelchair bound and nonambulatory. She has a dense right hemiparesis. She expresses some words but they are nonsensical.  Skin: Skin is warm and dry. No rash noted. She is not diaphoretic. No erythema. No pallor.    Lab Results  Component Value Date   WBC 6.2 04/05/2014   HGB 10.5* 04/05/2014   HCT 33.9* 04/05/2014   PLT 707* 04/05/2014   GLUCOSE 93 04/04/2014   CHOL 95 03/09/2014   TRIG 54 03/09/2014   HDL 31* 03/09/2014   LDLCALC 53 03/09/2014   ALT 10 03/14/2014   AST 17 03/14/2014   NA 141 04/04/2014   K 4.2 04/04/2014   CL 106 04/04/2014   CREATININE 0.49* 04/04/2014   BUN 10 04/04/2014   CO2 28 04/04/2014   TSH 0.133* 03/10/2014   INR 1.7 07/24/2014   HGBA1C 5.4 03/09/2014    US Soft Tissue Head/neck  05/09/2014   CLINICAL DATA:  Follow-up nodule  EXAM: THYROID ULTRASOUND  TECHNIQUE: Ultrasound examination of the thyroid gland and adjacent soft tissues was performed.  COMPARISON:  09/02/2012  FINDINGS: Right thyroid lobe  Measurements: 6.6 x 2.9 x  3.6 cm. Solid lower pole nodule measures 2.3 x 2.3 x 2.8 cm and previously measured 3.0 x 1.9 x 2.2 cm. Smaller upper pole nodules are also present. The largest measures 1.1 cm.  Left thyroid lobe  Measurements: 9.1 x 5.5 x 5.7 cm. Left lobe is heterogeneous. There is a lower pole mass measuring 5.0 x 5.6 cm. The sagittal measurement cannot be measured because the inferior aspect is obscured.  Isthmus  Thickness: 1.5 cm.  No nodules visualized.  Lymphadenopathy  None visualized.  Additional findings  There is heterogeneous signal in the right internal jugular vein. The vein was not compressed, therefore DVT cannot be confirmed by this study.  IMPRESSION: Bilateral thyroid nodules. There is a new nodule identified in the lower pole of the left lobe. It measures 5.5 cm. Findings meet consensus criteria for biopsy. Ultrasound-guided fine needle aspiration should be considered, as per the consensus statement: Management of Thyroid Nodules  Detected at [place]: Society of Radiologists in [place]. Radiology 2005; [id number].  There is heterogeneous signal in the right internal jugular vein. This may represent DVT or slow flow an was noted on the prior study. If DVT is suspected, dedicated Doppler study can be performed.   Electronically Signed   By: Marybelle Killings M.D.   On: 05/09/2014 12:34    Assessment & Plan:   [name] was seen today for anemia.  Diagnoses and all orders for this visit:  DVT of lower extremity, bilateral- [facility] to monitor her pro time and INR. Orders: -     warfarin (COUMADIN) 1 MG tablet; Take 4 tablets (4 mg total) by mouth one time only at 6 PM. -     Protime-INR; Future  Essential hypertension- blood pressure is well controlled, will monitor her lites and renal function today. Orders: -     Basic metabolic panel; Future  Midline back pain, unspecified location- will continue the current meds for pain relief. Orders: -     traMADol (ULTRAM) 50 MG tablet; One po TID as needed for pain -     gabapentin (NEURONTIN) 100 MG capsule; Take 1 capsule (100 mg total) by mouth 3 (three) times daily.  Primary osteoarthritis involving multiple joints Orders: -     diclofenac sodium (VOLTAREN) 1 % GEL; Apply 2 g topically 3 (three) times daily. -     traMADol (ULTRAM) 50 MG tablet; One po TID as needed for pain -     gabapentin (NEURONTIN) 100 MG capsule; Take 1 capsule (100 mg total) by mouth 3 (three) times daily.  Insomnia Orders: -     traZODone (DESYREL) 50 MG tablet; Take 1 tablet (50 mg total) by mouth at bedtime as needed for sleep.  Pernicious anemia- there are no reports of blood loss, I will recheck her CBC today and will look at her vitamin levels as well. Orders: -     CBC with Differential/Platelet; Future -     IBC panel; Future -     Vitamin B12; Future -     Ferritin; Future -     Folate; Future  Low TSH level- I will recheck her thyroid function test and  treat if indicated. Orders: -     T4; Future -     T3, free; Future -     TSH; Future  Hearing loss, bilateral Orders: -     Ambulatory referral to Audiology  Decreased vision in both eyes Orders: -     Ambulatory referral to Ophthalmology  Other orders -     omeprazole (PRILOSEC) 20 MG capsule; Take 1 capsule (20 mg total) by mouth daily. On an empty stomach   I have discontinued Ms. Betzer's warfarin, lidocaine, predniSONE, and ciclopirox. I have also changed her omeprazole, diclofenac sodium, traMADol, gabapentin, and warfarin. Additionally, I am having her maintain her ALIGN, acetaminophen, iron polysaccharides, antiseptic oral rinse, DECUBI-VITE, propranolol, senna-docusate, and traZODone.  Meds ordered this encounter  Medications  . DISCONTD: gabapentin (NEURONTIN) 100 MG capsule    Sig:   . DISCONTD: warfarin (COUMADIN) 1 MG tablet    Sig:   . omeprazole (PRILOSEC) 20 MG capsule    Sig: Take 1 capsule (20 mg total) by mouth daily. On an empty stomach    Dispense:  90 capsule    Refill:  1  . diclofenac sodium (VOLTAREN) 1 % GEL    Sig: Apply 2 g topically 3 (three) times daily.    Dispense:  100 g    Refill:  11  . traZODone (DESYREL) 50 MG tablet    Sig: Take 1 tablet (50 mg total) by mouth at bedtime as needed for sleep.    Dispense:  30 tablet    Refill:  5  . traMADol (ULTRAM) 50 MG tablet    Sig: One po TID as needed for pain    Dispense:  90 tablet    Refill:  5  . gabapentin (NEURONTIN) 100 MG capsule    Sig: Take 1 capsule (100 mg total) by mouth 3 (three) times daily.    Dispense:  90 capsule    Refill:  5  . warfarin (COUMADIN) 1 MG tablet    Sig: Take 4 tablets (4 mg total) by mouth one time only at 6 PM.    Dispense:  120 tablet    Refill:  3     Follow-up: Return in about 3 months (around 10/26/2014).  Scarlette Calico, MD

## 2014-07-26 NOTE — Patient Instructions (Signed)

## 2014-07-26 NOTE — Telephone Encounter (Signed)
Sreegh(?) from [facility] called requesting a hoyer lift for patient.  Please call him at [phone]

## 2014-07-26 NOTE — Progress Notes (Signed)
Pre visit review using our clinic review tool, if applicable. No additional management support is needed unless otherwise documented below in the visit note.  Patient unable to weigh-wheelchair bound.

## 2014-07-26 NOTE — Telephone Encounter (Signed)
OT has requested that hard raised areas on right arm above mid part of arm to be looked at today during OV with Dr. Ronnald Ramp at 3:45.  Sandy Barrett is requesting a call back with a report in regards.

## 2014-07-27 ENCOUNTER — Telehealth: Payer: Self-pay

## 2014-07-27 DIAGNOSIS — I69351 Hemiplegia and hemiparesis following cerebral infarction affecting right dominant side: Secondary | ICD-10-CM | POA: Diagnosis not present

## 2014-07-27 DIAGNOSIS — I69320 Aphasia following cerebral infarction: Secondary | ICD-10-CM | POA: Diagnosis not present

## 2014-07-27 NOTE — Telephone Encounter (Signed)
Verbal given 

## 2014-07-27 NOTE — Telephone Encounter (Signed)
Received pharmacy rejection stating that insurance will not cover lidocaine  without a prior authorization. PA submitted to insurance via covermymeds, approval now pending their decision.

## 2014-07-27 NOTE — Telephone Encounter (Signed)
Yes, verbal ok with me

## 2014-07-30 ENCOUNTER — Telehealth: Payer: Self-pay | Admitting: Internal Medicine

## 2014-07-30 DIAGNOSIS — I69351 Hemiplegia and hemiparesis following cerebral infarction affecting right dominant side: Secondary | ICD-10-CM | POA: Diagnosis not present

## 2014-07-30 DIAGNOSIS — I69320 Aphasia following cerebral infarction: Secondary | ICD-10-CM | POA: Diagnosis not present

## 2014-07-30 NOTE — Telephone Encounter (Signed)
CVA I63.40 Signed - thanks

## 2014-07-30 NOTE — Telephone Encounter (Signed)
Question re: gabapentin (NEURONTIN) 100 MG capsule [[id number]] . The sig on the script before her visit with dr Ronnald Ramp was 2 times per day. The new script reads three times per day. They want to make sure that this is correct, or if it was keyed in error. There is no specific note in the medical note for that day citing a change, but there were several medications that were changed. Thank you.

## 2014-07-30 NOTE — Telephone Encounter (Signed)
rx for hoyer lift is pended.  DX code for rx?

## 2014-07-30 NOTE — Telephone Encounter (Signed)
Per 07/08/14 Epic note, pt takes gabapentin 100 mg BID, please advise if new dose should be TID. Thanks

## 2014-07-30 NOTE — Telephone Encounter (Signed)
3 times a day is usually more effective than twice a day, try that dose and see if it helps with the back pain.

## 2014-07-31 ENCOUNTER — Ambulatory Visit (INDEPENDENT_AMBULATORY_CARE_PROVIDER_SITE_OTHER): Payer: Medicare Other | Admitting: General Practice

## 2014-07-31 ENCOUNTER — Telehealth: Payer: Self-pay | Admitting: Internal Medicine

## 2014-07-31 DIAGNOSIS — I69320 Aphasia following cerebral infarction: Secondary | ICD-10-CM | POA: Diagnosis not present

## 2014-07-31 DIAGNOSIS — N644 Mastodynia: Secondary | ICD-10-CM

## 2014-07-31 DIAGNOSIS — I69351 Hemiplegia and hemiparesis following cerebral infarction affecting right dominant side: Secondary | ICD-10-CM | POA: Diagnosis not present

## 2014-07-31 LAB — POCT INR: INR: 1.9

## 2014-07-31 NOTE — Progress Notes (Signed)
I have reviewed and agree with the plan. 

## 2014-07-31 NOTE — Telephone Encounter (Signed)
Sandy Barrett is calling to follow up on [place] lift. She is stating that neither [facility] or [facility] received the script. She provided a fax # of  [phone]. Please give her a call with update

## 2014-07-31 NOTE — Telephone Encounter (Signed)
Order for lift faxed to [phone], daughter notified and will pick up original orders which has been placed upfront. Daughter wanted to inform MD that patient has started occassionally grabbing her breast due to pain. They would like to know if MD wish to see patient for breast check or just start with mammogram.

## 2014-07-31 NOTE — Telephone Encounter (Signed)
Patient daughter notified

## 2014-07-31 NOTE — Progress Notes (Signed)
Pre visit review using our clinic review tool, if applicable. No additional management support is needed unless otherwise documented below in the visit note. 

## 2014-08-01 DIAGNOSIS — I69351 Hemiplegia and hemiparesis following cerebral infarction affecting right dominant side: Secondary | ICD-10-CM | POA: Diagnosis not present

## 2014-08-01 DIAGNOSIS — I69320 Aphasia following cerebral infarction: Secondary | ICD-10-CM | POA: Diagnosis not present

## 2014-08-01 NOTE — Telephone Encounter (Signed)
Will order mammo - OV if redness, swelling, fever etc thanks

## 2014-08-02 ENCOUNTER — Other Ambulatory Visit: Payer: Self-pay | Admitting: Internal Medicine

## 2014-08-02 DIAGNOSIS — N644 Mastodynia: Secondary | ICD-10-CM

## 2014-08-02 DIAGNOSIS — I69351 Hemiplegia and hemiparesis following cerebral infarction affecting right dominant side: Secondary | ICD-10-CM | POA: Diagnosis not present

## 2014-08-02 DIAGNOSIS — I69320 Aphasia following cerebral infarction: Secondary | ICD-10-CM | POA: Diagnosis not present

## 2014-08-02 NOTE — Telephone Encounter (Signed)
LVM for pt to call back as soon as possible.   

## 2014-08-03 DIAGNOSIS — I69320 Aphasia following cerebral infarction: Secondary | ICD-10-CM | POA: Diagnosis not present

## 2014-08-03 DIAGNOSIS — I69351 Hemiplegia and hemiparesis following cerebral infarction affecting right dominant side: Secondary | ICD-10-CM | POA: Diagnosis not present

## 2014-08-06 ENCOUNTER — Telehealth: Payer: Self-pay | Admitting: Internal Medicine

## 2014-08-06 NOTE — Telephone Encounter (Signed)
Leanord Asal [phone] [facility]   Need verbals  1 visit for home health and social work

## 2014-08-06 NOTE — Telephone Encounter (Signed)
Regarding note below.

## 2014-08-06 NOTE — Telephone Encounter (Signed)
Verbal okay given to Sreegh with [facility]. Number listed was not correct. Sreegh agreed to pass the message along.    [employer] number.

## 2014-08-06 NOTE — Telephone Encounter (Signed)
Anh Bigos called in regarding the lidocaine patches. Should we have heard their decision on this yet. Please call her at

## 2014-08-06 NOTE — Telephone Encounter (Signed)
disregard

## 2014-08-06 NOTE — Telephone Encounter (Signed)
[phone] °

## 2014-08-07 ENCOUNTER — Ambulatory Visit (INDEPENDENT_AMBULATORY_CARE_PROVIDER_SITE_OTHER): Payer: Medicare Other | Admitting: General Practice

## 2014-08-07 ENCOUNTER — Telehealth: Payer: Self-pay | Admitting: Neurology

## 2014-08-07 ENCOUNTER — Other Ambulatory Visit: Payer: Medicare Other

## 2014-08-07 DIAGNOSIS — Z5181 Encounter for therapeutic drug level monitoring: Secondary | ICD-10-CM

## 2014-08-07 DIAGNOSIS — I69320 Aphasia following cerebral infarction: Secondary | ICD-10-CM | POA: Diagnosis not present

## 2014-08-07 DIAGNOSIS — I639 Cerebral infarction, unspecified: Secondary | ICD-10-CM

## 2014-08-07 DIAGNOSIS — I63311 Cerebral infarction due to thrombosis of right middle cerebral artery: Secondary | ICD-10-CM

## 2014-08-07 DIAGNOSIS — I2699 Other pulmonary embolism without acute cor pulmonale: Secondary | ICD-10-CM

## 2014-08-07 DIAGNOSIS — I82403 Acute embolism and thrombosis of unspecified deep veins of lower extremity, bilateral: Secondary | ICD-10-CM

## 2014-08-07 DIAGNOSIS — I69351 Hemiplegia and hemiparesis following cerebral infarction affecting right dominant side: Secondary | ICD-10-CM | POA: Diagnosis not present

## 2014-08-07 LAB — POCT INR: INR: 2.2

## 2014-08-07 NOTE — Progress Notes (Signed)
Pre visit review using our clinic review tool, if applicable. No additional management support is needed unless otherwise documented below in the visit note. 

## 2014-08-07 NOTE — Telephone Encounter (Signed)
Patient is at high risk for thromboembolism if she has to stop warfarin given recent history of DVT and pulmonary embolism. Hence she cannot be cleared to stop warfarin for the procedure. If the procedure can be done without stopping warfarin she maybe neurologically cleared

## 2014-08-07 NOTE — Telephone Encounter (Signed)
Amy from [facility] called and needs a medical clearance sent to them for the pt. May fax to [phone]. Thank you

## 2014-08-07 NOTE — Progress Notes (Signed)
I have reviewed and agree with the plan. 

## 2014-08-08 ENCOUNTER — Telehealth: Payer: Self-pay

## 2014-08-08 ENCOUNTER — Telehealth: Payer: Self-pay | Admitting: Internal Medicine

## 2014-08-08 DIAGNOSIS — I69320 Aphasia following cerebral infarction: Secondary | ICD-10-CM | POA: Diagnosis not present

## 2014-08-08 DIAGNOSIS — I69351 Hemiplegia and hemiparesis following cerebral infarction affecting right dominant side: Secondary | ICD-10-CM | POA: Diagnosis not present

## 2014-08-08 NOTE — Telephone Encounter (Signed)
LMOVM explaining insurance denial and covered alternative sent to pharmacy. Must try and fail alternative before insurance will consider lidocaine.

## 2014-08-08 NOTE — Telephone Encounter (Signed)
Verbal okay to check on pt and provide wound care with notes to the office of same.

## 2014-08-08 NOTE — Telephone Encounter (Signed)
Per Epic, lidocaine patch was d/c off of medication list and replaced with voltaren gel as a formulary change.

## 2014-08-08 NOTE — Telephone Encounter (Signed)
Unable to LVM, pt requesting letter for clearance for dental procedure, Dr. Leonie Man is unable to provide clearance.  I will fax letter to suggested number.

## 2014-08-08 NOTE — Telephone Encounter (Signed)
Vinnie Level from [facility] called and pts daughter states pt has wound on her back and Vinnie Level would like to go today to take care of it.  Please call her at [phone]

## 2014-08-09 DIAGNOSIS — I69320 Aphasia following cerebral infarction: Secondary | ICD-10-CM | POA: Diagnosis not present

## 2014-08-09 DIAGNOSIS — I69351 Hemiplegia and hemiparesis following cerebral infarction affecting right dominant side: Secondary | ICD-10-CM | POA: Diagnosis not present

## 2014-08-10 DIAGNOSIS — I69320 Aphasia following cerebral infarction: Secondary | ICD-10-CM | POA: Diagnosis not present

## 2014-08-10 DIAGNOSIS — I69351 Hemiplegia and hemiparesis following cerebral infarction affecting right dominant side: Secondary | ICD-10-CM | POA: Diagnosis not present

## 2014-08-10 NOTE — Telephone Encounter (Signed)
Agree. thanks

## 2014-08-13 DIAGNOSIS — I69351 Hemiplegia and hemiparesis following cerebral infarction affecting right dominant side: Secondary | ICD-10-CM | POA: Diagnosis not present

## 2014-08-13 DIAGNOSIS — I69320 Aphasia following cerebral infarction: Secondary | ICD-10-CM | POA: Diagnosis not present

## 2014-08-14 ENCOUNTER — Ambulatory Visit (INDEPENDENT_AMBULATORY_CARE_PROVIDER_SITE_OTHER): Payer: Medicare Other | Admitting: General Practice

## 2014-08-14 ENCOUNTER — Ambulatory Visit
Admission: RE | Admit: 2014-08-14 | Discharge: 2014-08-14 | Disposition: A | Discharge status: Home / Self Care | Payer: Medicare Other | Source: Ambulatory Visit | Attending: Internal Medicine | Admitting: Internal Medicine

## 2014-08-14 DIAGNOSIS — I82403 Acute embolism and thrombosis of unspecified deep veins of lower extremity, bilateral: Secondary | ICD-10-CM

## 2014-08-14 DIAGNOSIS — I2699 Other pulmonary embolism without acute cor pulmonale: Secondary | ICD-10-CM

## 2014-08-14 DIAGNOSIS — N644 Mastodynia: Secondary | ICD-10-CM | POA: Diagnosis not present

## 2014-08-14 DIAGNOSIS — I69320 Aphasia following cerebral infarction: Secondary | ICD-10-CM | POA: Diagnosis not present

## 2014-08-14 DIAGNOSIS — I69351 Hemiplegia and hemiparesis following cerebral infarction affecting right dominant side: Secondary | ICD-10-CM | POA: Diagnosis not present

## 2014-08-14 DIAGNOSIS — Z5181 Encounter for therapeutic drug level monitoring: Secondary | ICD-10-CM

## 2014-08-14 DIAGNOSIS — I63311 Cerebral infarction due to thrombosis of right middle cerebral artery: Secondary | ICD-10-CM

## 2014-08-14 DIAGNOSIS — I639 Cerebral infarction, unspecified: Secondary | ICD-10-CM

## 2014-08-14 LAB — POCT INR: INR: 1.9

## 2014-08-14 NOTE — Progress Notes (Signed)
Pre visit review using our clinic review tool, if applicable. No additional management support is needed unless otherwise documented below in the visit note. 

## 2014-08-14 NOTE — Progress Notes (Signed)
I have reviewed and agree with the plan. 

## 2014-08-15 ENCOUNTER — Telehealth: Payer: Self-pay | Admitting: Internal Medicine

## 2014-08-15 NOTE — Telephone Encounter (Signed)
Nurse called in to make sure patient had appointment set up with someone for vaginal discharge.

## 2014-08-16 DIAGNOSIS — I69320 Aphasia following cerebral infarction: Secondary | ICD-10-CM | POA: Diagnosis not present

## 2014-08-16 DIAGNOSIS — I69351 Hemiplegia and hemiparesis following cerebral infarction affecting right dominant side: Secondary | ICD-10-CM | POA: Diagnosis not present

## 2014-08-17 DIAGNOSIS — I69320 Aphasia following cerebral infarction: Secondary | ICD-10-CM | POA: Diagnosis not present

## 2014-08-17 DIAGNOSIS — I69351 Hemiplegia and hemiparesis following cerebral infarction affecting right dominant side: Secondary | ICD-10-CM | POA: Diagnosis not present

## 2014-08-20 ENCOUNTER — Encounter: Payer: Self-pay | Admitting: Physical Medicine & Rehabilitation

## 2014-08-20 ENCOUNTER — Ambulatory Visit (HOSPITAL_BASED_OUTPATIENT_CLINIC_OR_DEPARTMENT_OTHER): Payer: Medicare Other | Admitting: Physical Medicine & Rehabilitation

## 2014-08-20 ENCOUNTER — Encounter: Payer: Medicare Other | Attending: Physical Medicine & Rehabilitation

## 2014-08-20 VITALS — BP 117/63 | HR 82

## 2014-08-20 DIAGNOSIS — I69320 Aphasia following cerebral infarction: Secondary | ICD-10-CM | POA: Diagnosis not present

## 2014-08-20 DIAGNOSIS — I639 Cerebral infarction, unspecified: Secondary | ICD-10-CM | POA: Diagnosis not present

## 2014-08-20 DIAGNOSIS — I69351 Hemiplegia and hemiparesis following cerebral infarction affecting right dominant side: Secondary | ICD-10-CM | POA: Diagnosis not present

## 2014-08-20 DIAGNOSIS — G8190 Hemiplegia, unspecified affecting unspecified side: Secondary | ICD-10-CM | POA: Diagnosis not present

## 2014-08-20 DIAGNOSIS — M7541 Impingement syndrome of right shoulder: Secondary | ICD-10-CM | POA: Diagnosis not present

## 2014-08-20 DIAGNOSIS — M6289 Other specified disorders of muscle: Secondary | ICD-10-CM | POA: Insufficient documentation

## 2014-08-20 DIAGNOSIS — R482 Apraxia: Secondary | ICD-10-CM

## 2014-08-20 DIAGNOSIS — I69391 Dysphagia following cerebral infarction: Secondary | ICD-10-CM

## 2014-08-20 DIAGNOSIS — I69921 Dysphasia following unspecified cerebrovascular disease: Secondary | ICD-10-CM | POA: Diagnosis not present

## 2014-08-20 DIAGNOSIS — G8110 Spastic hemiplegia affecting unspecified side: Secondary | ICD-10-CM

## 2014-08-20 DIAGNOSIS — IMO0002 Reserved for concepts with insufficient information to code with codable children: Secondary | ICD-10-CM

## 2014-08-20 DIAGNOSIS — I6340 Cerebral infarction due to embolism of unspecified cerebral artery: Secondary | ICD-10-CM

## 2014-08-20 DIAGNOSIS — R531 Weakness: Secondary | ICD-10-CM

## 2014-08-20 NOTE — Progress Notes (Signed)
Subjective:    Patient ID: Sandy Barrett, female    DOB: [birth year], 81 y.o.   MRN: [id number]  HPI Patient is severely aphasic. Most of conversation is with her daughter who is also her caregiver  81 year old female who had a CVA in March 2016, went to inpatient rehabilitation in March and then was at [facility] for skilled nursing level rehabilitation for approximately 3 months. She is now back at home with 24-hour care. Her daughters and hired caregivers are splitting up the time.  She requires assistance for bathing dressing toileting she is mainly wheelchair bound. Home health PT and OT and speech therapy are finishing up now daughter is wondering what the next step is.  Patient does not verbalize pain well but the daughter states that she can use her facial expressions to gauge her pain. She mostly notices pain when they manipulate the right upper extremity.  Patient has responded well to the right shoulder injection in May as well as the right knee injection in April  Pain Inventory Average Pain 8 Pain Right Now 6 My pain is aching  In the last 24 hours, has pain interfered with the following? General activity 8 Relation with others 8 Enjoyment of life 10 What TIME of day is your pain at its worst? daytime Sleep (in general) Fair  Pain is worse with: walking and standing Pain improves with: medication and injections Relief from Meds: 7  Mobility use a wheelchair  Function retired I need assistance with the following:  dressing, bathing, toileting, meal prep, household duties and shopping  Neuro/Psych bladder control problems bowel control problems weakness trouble walking confusion depression anxiety  Prior Studies Any changes since last visit?  yes  Physicians involved in your care Any changes since last visit?  no   Family History  Problem Relation Age of Onset  . Asthma Mother   . Asthma Father   . Prostate cancer Father   . Stroke Brother     Social History   Social History  . Marital Status: Widowed    Spouse Name: N/A  . Number of Children: 2  . Years of Education: 14   Occupational History  . retired     Restaurant manager, fast food   Social History Main Topics  . Smoking status: Never Smoker   . Smokeless tobacco: None  . Alcohol Use: No  . Drug Use: No  . Sexual Activity: No   Other Topics Concern  . None   Social History Narrative   Widowed, lived alone prior to CVA, currently residing at [facility]   Right handed   Caffeine use- occasionally drinks tea   Past Surgical History  Procedure Laterality Date  . Abdominal hysterectomy  1976  . Total hip arthroplasty  1998     right  . Tonsillectomy    . Breast surgery      biopsy  . Radiology with anesthesia Left 03/08/2014    Procedure: RADIOLOGY WITH ANESTHESIA;  Surgeon: Rob Hickman, MD;  Location: [place];  Service: Radiology;  Laterality: Left;   Past Medical History  Diagnosis Date  . HYPERTENSION   . HYPERTHYROIDISM   . OBESITY   . ARTHRITIS   . Posttraumatic stress disorder   . INSOMNIA   . MIGRAINE HEADACHE   . Hyperthyroidism     s/p I-131 ablation 03/2011 of multinod goiter  . Arthritis   . Stroke    BP 117/63 mmHg  Pulse 82  SpO2 94%  Opioid  Risk Score:   Fall Risk Score:  `1  Depression screen PHQ 2/9  Depression screen [facility] 2/9 08/20/2014 08/07/2013 12/24/2011  Decreased Interest 0 0 0  Down, Depressed, Hopeless 0 1 0  PHQ - 2 Score 0 1 0     Review of Systems  Gastrointestinal:       Bowel Control Problems  Genitourinary:       Bladder control problems  Musculoskeletal: Positive for gait problem.  Psychiatric/Behavioral: Positive for confusion and dysphoric mood.  All other systems reviewed and are negative.      Objective:   Physical Exam  Constitutional: She is oriented to person, place, and time. She appears well-developed and well-nourished.  HENT:  Head: Normocephalic and atraumatic.  Eyes: Conjunctivae  and EOM are normal. Pupils are equal, round, and reactive to light.  Neck: Normal range of motion.  Musculoskeletal:  Pain with right shoulder range of motion. There is one finger breath subluxation.  There is also pain with wrist extension finger extension as well as supination of the forearm on the right side.  Neurological: She is alert and oriented to person, place, and time.  0/5 in the right finger and wrist flexors as well as arm flexors extensors. 2 minus hip knee extensor synergy in the right lower extremity otherwise 0/5 Speech, patient does follow some simple commands on an inconsistent basis. She shows evidence of motor apraxia as well as apraxia of speech Patient is unable to name simple objects. Perseverative  Psychiatric: She has a normal mood and affect.  Nursing note and vitals reviewed.  Modified [place] 3 spasticity in the finger flexors pronator and wrist flexors on the right side       Assessment & Plan:  1.  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 She has transitioned to the home environment and requires 24-hour care. She has completed inpatient rehabilitation as well as Skilled nursing and home health rehabilitation. She is about 5 months post CVA She has increased tone in the right upper extremity Recommend Botox injection to the finger flexors and wrist flexors 200 units total will also include pronator teres I would recommend outpatient Rehabilitation with PT OT and speech. Goals would be to improve transfers, pre-gait activities, right upper extremity spasticity reduction, right upper extremity range of motion. Improve functional communication.  2. Right shoulder pain with subluxation appears to be chronic at the current time. Likely multifactorial. We will inject shoulder today this may give her partial relief but certainly will not address all of her issues. May be a candidate for right shoulder suprascapular  nerve block and possible cryo-ablation  3. Post stroke pain neurogenic, continue gabapentin which is now prescribe her primary care physician  4. Knee pain improved after injection no need for reinjection at the current time

## 2014-08-20 NOTE — Patient Instructions (Signed)
OnabotulinumtoxinA injection (Medical Use) What is this medicine? ONABOTULINUMTOXINA (o na BOTT you lye num tox in eh) is a neuro-muscular blocker. This medicine is used to treat crossed eyes, eyelid spasms, severe neck muscle spasms, and elbow, wrist, and finger muscle spasms. It is also used to treat excessive underarm sweating, to prevent chronic migraine headaches, and to treat loss of bladder control due to neurologic conditions such as multiple sclerosis or spinal cord injury. This medicine may be used for other purposes; ask your health care provider or pharmacist if you have questions. COMMON BRAND NAME(S): Botox What should I tell my health care provider before I take this medicine? They need to know if you have any of these conditions: -breathing problems -cerebral palsy spasms -difficulty urinating -heart problems -history of surgery where this medicine is going to be used -infection at the site where this medicine is going to be used -myasthenia gravis or other neurologic disease -nerve or muscle disease -surgery plans -take medicines that treat or prevent blood clots -thyroid problems -an unusual or allergic reaction to botulinum toxin, albumin, other medicines, foods, dyes, or preservatives -pregnant or trying to get pregnant -breast-feeding How should I use this medicine? This medicine is for injection into a muscle. It is given by a health care professional in a hospital or clinic setting. Talk to your pediatrician regarding the use of this medicine in children. While this drug may be prescribed for children as young as 55 years old for selected conditions, precautions do apply. Overdosage: If you think you have taken too much of this medicine contact a poison control center or emergency room at once. NOTE: This medicine is only for you. Do not share this medicine with others. What if I miss a dose? This does not apply. What may interact with this  medicine? -aminoglycoside antibiotics like gentamicin, neomycin, tobramycin -muscle relaxants -other botulinum toxin injections This list may not describe all possible interactions. Give your health care provider a list of all the medicines, herbs, non-prescription drugs, or dietary supplements you use. Also tell them if you smoke, drink alcohol, or use illegal drugs. Some items may interact with your medicine. What should I watch for while using this medicine? Visit your doctor for regular check ups. This medicine will cause weakness in the muscle where it is injected. Tell your doctor if you feel unusually weak in other muscles. Get medical help right away if you have problems with breathing, swallowing, or talking. This medicine might make your eyelids droop or make you see blurry or double. If you have weak muscles or trouble seeing do not drive a car, use machinery, or do other dangerous activities. This medicine contains albumin from human blood. It may be possible to pass an infection in this medicine, but no cases have been reported. Talk to your doctor about the risks and benefits of this medicine. If your activities have been limited by your condition, go back to your regular routine slowly after treatment with this medicine. What side effects may I notice from receiving this medicine? Side effects that you should report to your doctor or health care professional as soon as possible: -allergic reactions like skin rash, itching or hives, swelling of the face, lips, or tongue -breathing problems -changes in vision -chest pain or tightness -eye irritation, pain -fast, irregular heartbeat -infection -numbness -speech problems -swallowing problems -unusual weakness Side effects that usually do not require medical attention (report to your doctor or health care professional if they continue or  are bothersome): -bruising or pain at site where injected -drooping eyelid -dry eyes or  mouth -headache -muscles aches, pains -sensitivity to light -tearing This list may not describe all possible side effects. Call your doctor for medical advice about side effects. You may report side effects to FDA at 1-800-FDA-1088. Where should I keep my medicine? This drug is given in a hospital or clinic and will not be stored at home. NOTE: This sheet is a summary. It may not cover all possible information. If you have questions about this medicine, talk to your doctor, pharmacist, or health care provider.  2015, [employer]. (2011-09-21 17:30:24)

## 2014-08-21 ENCOUNTER — Ambulatory Visit (INDEPENDENT_AMBULATORY_CARE_PROVIDER_SITE_OTHER): Payer: Medicare Other | Admitting: Internal Medicine

## 2014-08-21 ENCOUNTER — Encounter: Payer: Self-pay | Admitting: Internal Medicine

## 2014-08-21 ENCOUNTER — Ambulatory Visit (INDEPENDENT_AMBULATORY_CARE_PROVIDER_SITE_OTHER): Payer: Medicare Other | Admitting: General Practice

## 2014-08-21 ENCOUNTER — Telehealth: Payer: Self-pay

## 2014-08-21 ENCOUNTER — Other Ambulatory Visit (INDEPENDENT_AMBULATORY_CARE_PROVIDER_SITE_OTHER): Payer: Medicare Other

## 2014-08-21 VITALS — BP 110/78 | HR 83 | Temp 98.3°F | Resp 16 | Ht 65.0 in

## 2014-08-21 DIAGNOSIS — I2699 Other pulmonary embolism without acute cor pulmonale: Secondary | ICD-10-CM

## 2014-08-21 DIAGNOSIS — I82403 Acute embolism and thrombosis of unspecified deep veins of lower extremity, bilateral: Secondary | ICD-10-CM

## 2014-08-21 DIAGNOSIS — R7989 Other specified abnormal findings of blood chemistry: Secondary | ICD-10-CM

## 2014-08-21 DIAGNOSIS — I10 Essential (primary) hypertension: Secondary | ICD-10-CM | POA: Diagnosis not present

## 2014-08-21 DIAGNOSIS — D510 Vitamin B12 deficiency anemia due to intrinsic factor deficiency: Secondary | ICD-10-CM

## 2014-08-21 DIAGNOSIS — I6340 Cerebral infarction due to embolism of unspecified cerebral artery: Secondary | ICD-10-CM

## 2014-08-21 DIAGNOSIS — I63311 Cerebral infarction due to thrombosis of right middle cerebral artery: Secondary | ICD-10-CM

## 2014-08-21 DIAGNOSIS — I69351 Hemiplegia and hemiparesis following cerebral infarction affecting right dominant side: Secondary | ICD-10-CM | POA: Diagnosis not present

## 2014-08-21 DIAGNOSIS — I639 Cerebral infarction, unspecified: Secondary | ICD-10-CM | POA: Diagnosis not present

## 2014-08-21 DIAGNOSIS — M5489 Other dorsalgia: Secondary | ICD-10-CM

## 2014-08-21 DIAGNOSIS — M8949 Other hypertrophic osteoarthropathy, multiple sites: Secondary | ICD-10-CM

## 2014-08-21 DIAGNOSIS — Z5181 Encounter for therapeutic drug level monitoring: Secondary | ICD-10-CM

## 2014-08-21 DIAGNOSIS — N390 Urinary tract infection, site not specified: Secondary | ICD-10-CM

## 2014-08-21 DIAGNOSIS — R946 Abnormal results of thyroid function studies: Secondary | ICD-10-CM

## 2014-08-21 DIAGNOSIS — M1710 Unilateral primary osteoarthritis, unspecified knee: Secondary | ICD-10-CM | POA: Diagnosis not present

## 2014-08-21 DIAGNOSIS — I69320 Aphasia following cerebral infarction: Secondary | ICD-10-CM | POA: Diagnosis not present

## 2014-08-21 DIAGNOSIS — M159 Polyosteoarthritis, unspecified: Secondary | ICD-10-CM

## 2014-08-21 DIAGNOSIS — M150 Primary generalized (osteo)arthritis: Secondary | ICD-10-CM

## 2014-08-21 LAB — VITAMIN B12: Vitamin B-12: 586 pg/mL (ref 211–911)

## 2014-08-21 LAB — CBC WITH DIFFERENTIAL/PLATELET
Basophils Absolute: 0 10*3/uL (ref 0.0–0.1)
Basophils Relative: 0.1 % (ref 0.0–3.0)
Eosinophils Absolute: 0 10*3/uL (ref 0.0–0.7)
Eosinophils Relative: 0.1 % (ref 0.0–5.0)
HCT: 44.1 % (ref 36.0–46.0)
Hemoglobin: 14.1 g/dL (ref 12.0–15.0)
Lymphocytes Relative: 9.7 % — ABNORMAL LOW (ref 12.0–46.0)
Lymphs Abs: 1.1 10*3/uL (ref 0.7–4.0)
MCHC: 31.9 g/dL (ref 30.0–36.0)
MCV: 89.6 fl (ref 78.0–100.0)
Monocytes Absolute: 0.7 10*3/uL (ref 0.1–1.0)
Monocytes Relative: 5.8 % (ref 3.0–12.0)
Neutro Abs: 9.6 10*3/uL — ABNORMAL HIGH (ref 1.4–7.7)
Neutrophils Relative %: 84.3 % — ABNORMAL HIGH (ref 43.0–77.0)
Platelets: 925 10*3/uL — ABNORMAL HIGH (ref 150.0–400.0)
RBC: 4.92 Mil/uL (ref 3.87–5.11)
RDW: 17.6 % — ABNORMAL HIGH (ref 11.5–15.5)
WBC: 11.4 10*3/uL — ABNORMAL HIGH (ref 4.0–10.5)

## 2014-08-21 LAB — BASIC METABOLIC PANEL
BUN: 14 mg/dL (ref 6–23)
CO2: 29 mEq/L (ref 19–32)
Calcium: 10 mg/dL (ref 8.4–10.5)
Chloride: 103 mEq/L (ref 96–112)
Creatinine, Ser: 0.42 mg/dL (ref 0.40–1.20)
GFR: 186.7 mL/min (ref >60.00)
Glucose, Bld: 105 mg/dL — ABNORMAL HIGH (ref 70–99)
Potassium: 3.9 mEq/L (ref 3.5–5.1)
Sodium: 140 mEq/L (ref 135–145)

## 2014-08-21 LAB — IBC PANEL
Iron: 29 ug/dL — ABNORMAL LOW (ref 42–145)
Saturation Ratios: 9.1 % — ABNORMAL LOW (ref 20.0–50.0)
Transferrin: 228 mg/dL (ref 212.0–360.0)

## 2014-08-21 LAB — POCT INR: INR: 2.4

## 2014-08-21 LAB — TSH: TSH: 0.25 u[IU]/mL — ABNORMAL LOW (ref 0.35–4.50)

## 2014-08-21 LAB — FOLATE: Folate: 13.7 ng/mL (ref >5.9)

## 2014-08-21 LAB — T4: T4, Total: 7.6 ug/dL (ref 4.5–12.0)

## 2014-08-21 LAB — T3, FREE: T3, Free: 2.6 pg/mL (ref 2.3–4.2)

## 2014-08-21 LAB — FERRITIN: Ferritin: 65.3 ng/mL (ref 10.0–291.0)

## 2014-08-21 LAB — PROTIME-INR
INR: 2.7 ratio — ABNORMAL HIGH (ref 0.8–1.0)
Prothrombin Time: 28.8 s — ABNORMAL HIGH (ref 9.6–13.1)

## 2014-08-21 MED ORDER — FERIVA 21/7 75-1 MG PO TABS: 1.0000 | ORAL_TABLET | Freq: Every day | ORAL | Status: DC

## 2014-08-21 MED ORDER — TRAMADOL HCL 50 MG PO TABS: ORAL_TABLET | ORAL | Status: DC

## 2014-08-21 NOTE — Progress Notes (Signed)
Subjective:  Patient ID: Sandy Barrett, female    DOB: 1932-09-28  Age: 81 y.o. MRN: [id number]  CC: Urinary Tract Infection and Anemia   HPI [name] Rathje presents for evaluation of abnormal urine and vaginal discharge for several weeks. Home health care nurses have noticed her urine is an abnormal color and thinks she might have a vaginal discharge. The patient's history is obtained through her daughter since the patient's communication is incoherent. It appears the lab work that I ordered during her last visit was never done. The daughter states that she thinks her mother has been having night sweats. There is not been much improvement in her dense right upper and lower extremity paresis and gibberish speech.  Outpatient Prescriptions Prior to Visit  Medication Sig Dispense Refill  . acetaminophen (TYLENOL) 325 MG tablet Take 1-2 tablets (325-650 mg total) by mouth every 4 (four) hours as needed for mild pain.    [name] diclofenac sodium (VOLTAREN) 1 % GEL Apply 2 g topically 3 (three) times daily. 100 g 11  . gabapentin (NEURONTIN) 100 MG capsule Take 1 capsule (100 mg total) by mouth 3 (three) times daily. 90 capsule 5  . Multiple Vitamins-Minerals (DECUBI-VITE) CAPS Take by mouth. Take 1 capsule by mouth daily for wound healing    . omeprazole (PRILOSEC) 20 MG capsule Take 1 capsule (20 mg total) by mouth daily. On an empty stomach 90 capsule 1  . Probiotic Product (ALIGN) 4 MG CAPS Take by mouth daily.      . propranolol (INDERAL) 10 MG tablet Take 10 mg by mouth as needed.    . senna-docusate (SENOKOT-S) 8.6-50 MG per tablet Take 2 tablets by mouth at bedtime.    . traZODone (DESYREL) 50 MG tablet Take 1 tablet (50 mg total) by mouth at bedtime as needed for sleep. 30 tablet 5  . warfarin (COUMADIN) 1 MG tablet Take 4 tablets (4 mg total) by mouth one time only at 6 PM. 120 tablet 3  . antiseptic oral rinse (CPC / CETYLPYRIDINIUM CHLORIDE 0.05%) 0.05 % LIQD solution 7 mLs by Mouth  Rinse route 2 (two) times daily.  0  . iron polysaccharides (NIFEREX) 150 MG capsule Take 1 capsule (150 mg total) by mouth 2 (two) times daily before lunch and supper.    . traMADol (ULTRAM) 50 MG tablet One po TID as needed for pain 90 tablet 5   No facility-administered medications prior to visit.    ROS Review of Systems  Constitutional: Positive for chills. Negative for fever, diaphoresis, activity change, appetite change, fatigue and unexpected weight change.  Eyes: Negative.   Respiratory: Negative.   Cardiovascular: Negative.   Gastrointestinal: Negative.  Negative for nausea, vomiting, abdominal pain, diarrhea and constipation.  Endocrine: Negative.   Genitourinary: Positive for dysuria and vaginal discharge. Negative for difficulty urinating.  Musculoskeletal: Positive for arthralgias.  Skin: Negative.  Negative for rash.  Allergic/Immunologic: Negative.   Neurological: Positive for dizziness, facial asymmetry and weakness. Negative for tremors, syncope and headaches.  Hematological: Negative.  Negative for adenopathy. Does not bruise/bleed easily.  Psychiatric/Behavioral: Negative.     Objective:  BP 110/78 mmHg  Pulse 83  Temp(Src) 98.3 F (36.8 C) (Oral)  Resp 16  Ht 5\' 5"  (1.651 m)  SpO2 96%  BP Readings from Last 3 Encounters:  08/21/14 110/78  08/20/14 117/63  07/26/14 130/82    Wt Readings from Last 3 Encounters:  07/06/14 171 lb 12.8 oz (77.928 kg)  05/31/14 164 lb 14.4 oz (  74.798 kg)  05/09/14 164 lb 9.6 oz (74.662 kg)    Physical Exam  Constitutional:  Non-toxic appearance. She does not have a sickly appearance. She does not appear ill. No distress.  HENT:  Mouth/Throat: Oropharynx is clear and moist. No oropharyngeal exudate.  Eyes: Conjunctivae are normal. Right eye exhibits no discharge. Left eye exhibits no discharge. No scleral icterus.  Neck: Normal range of motion. Neck supple. No JVD present. No tracheal deviation present. No thyromegaly  present.  Cardiovascular: Normal rate, regular rhythm, normal heart sounds and intact distal pulses.  Exam reveals no gallop and no friction rub.   No murmur heard. Pulmonary/Chest: Effort normal and breath sounds normal. No stridor. No respiratory distress. She has no wheezes. She has no rales. She exhibits no tenderness.  Abdominal: Soft. Bowel sounds are normal. She exhibits no distension and no mass. There is no tenderness. There is no rebound and no guarding.  Lymphadenopathy:    She has no cervical adenopathy.  Neurological: She is alert. She displays atrophy. She displays no tremor. A cranial nerve deficit and sensory deficit is present. She exhibits abnormal muscle tone. She displays no seizure activity. Coordination and gait abnormal.  She has a persistent, dense right upper and lower extremity hemiparesis. We were not able to get her on the exam table to examine her urogenital systems.  Skin: She is not diaphoretic.    Lab Results  Component Value Date   WBC 11.4* 08/21/2014   HGB 14.1 08/21/2014   HCT 44.1 08/21/2014   PLT 925.0* 08/21/2014   GLUCOSE 105* 08/21/2014   CHOL 95 03/09/2014   TRIG 54 03/09/2014   HDL 31* 03/09/2014   LDLCALC 53 03/09/2014   ALT 10 03/14/2014   AST 17 03/14/2014   NA 140 08/21/2014   K 3.9 08/21/2014   CL 103 08/21/2014   CREATININE 0.42 08/21/2014   BUN 14 08/21/2014   CO2 29 08/21/2014   TSH 0.25* 08/21/2014   INR 2.7* 08/21/2014   HGBA1C 5.4 03/09/2014    US Breast Volo Axilla  08/14/2014   CLINICAL DATA:  81 year old female with intermittent focal pain in the upper-outer quadrant of the right breast.  EXAM: DIGITAL DIAGNOSTIC BILATERAL MAMMOGRAM WITH 3D TOMOSYNTHESIS AND CAD  COMPARISON:  No prior mammograms available for comparison.  ACR Breast Density Category b: There are scattered areas of fibroglandular density.  FINDINGS: There is marked diffuse skin and trabecular thickening throughout the right breast. Nipple  inversion is noted on the right side. No suspicious masses, calcifications or areas of distortion are visualized bilaterally.  Mammographic images were processed with CAD.  Physical exam of the right breast is limited by the patient's condition following stroke resulting in immobility and pain of the right upper extremity. The exam demonstrates diffuse edema and nipple inversion, corresponding with the mammographic findings. There is a focal area of discoloration of the upper inner quadrant of the right breast. Palpation at the site of discoloration demonstrates an elongated rope-like palpable cord. Otherwise, there are no additional areas of erythema. No rashes are identified. Scarring is noted at the upper-outer quadrant of the right breast at the site of pain, corresponding with a remote benign excisional biopsy.  Ultrasound at the site of pain in the upper-outer quadrant of the right breast at 11 o'clock demonstrates marked skin thickening, edema and tortuous dilated blood vessels. No masses or suspicious areas of shadowing are identified. Ultrasound targeted to the area of discoloration in the upper  inner quadrant at 1 o'clock, 15 cm from the nipple demonstrates a dilated vein with absent internal blood flow. This vessel is incompletely compressible, compatible with thrombophlebitis. The axilla was unable to be imaged due to immobility and pain in the right shoulder following a recent stroke.  IMPRESSION: 1. Marked skin and trabecular thickening throughout the right breast. Through history provided by the patient's daughter, the patient has had significant edema throughout the right upper extremity following her stroke in early 2016. This may explain associated right breast edema.  2. Nipple inversion of uncertain chronicity given lack of comparisons.  3. No mammographic or targeted sonographic abnormalities at the site of pain in the upper-outer quadrant of the right breast.  4. 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  RECOMMENDATION: Clinical follow-up is recommended to evaluate and monitor the diffuse skin edema, nipple inversion and the discoloration in the upper inner quadrant of the right breast. If there is clinical concern for an inflammatory breast cancer, skin punch biopsy should be considered.  I have discussed the findings and recommendations with the patient. Results were also provided in writing at the conclusion of the visit. If applicable, a reminder letter will be sent to the patient regarding the next appointment.  BI-RADS CATEGORY  2: Benign.   Electronically Signed   By: Ammie Ferrier M.D.   On: 08/14/2014 14:24   Mm Diag Breast Tomo Bilateral  08/14/2014   CLINICAL DATA:  81 year old female with intermittent focal pain in the upper-outer quadrant of the right breast.  EXAM: DIGITAL DIAGNOSTIC BILATERAL MAMMOGRAM WITH 3D TOMOSYNTHESIS AND CAD  COMPARISON:  No prior mammograms available for comparison.  ACR Breast Density Category b: There are scattered areas of fibroglandular density.  FINDINGS: There is marked diffuse skin and trabecular thickening throughout the right breast. Nipple inversion is noted on the right side. No suspicious masses, calcifications or areas of distortion are visualized bilaterally.  Mammographic images were processed with CAD.  Physical exam of the right breast is limited by the patient's condition following stroke resulting in immobility and pain of the right upper extremity. The exam demonstrates diffuse edema and nipple inversion, corresponding with the mammographic findings. There is a focal area of discoloration of the upper inner quadrant of the right breast. Palpation at the site of discoloration demonstrates an elongated rope-like palpable cord. Otherwise, there are no additional areas of erythema. No rashes are identified. Scarring is noted at the upper-outer quadrant of the  right breast at the site of pain, corresponding with a remote benign excisional biopsy.  Ultrasound at the site of pain in the upper-outer quadrant of the right breast at 11 o'clock demonstrates marked skin thickening, edema and tortuous dilated blood vessels. No masses or suspicious areas of shadowing are identified. Ultrasound targeted to the area of discoloration in the upper inner quadrant at 1 o'clock, 15 cm from the nipple demonstrates a dilated vein with absent internal blood flow. This vessel is incompletely compressible, compatible with thrombophlebitis. The axilla was unable to be imaged due to immobility and pain in the right shoulder following a recent stroke.  IMPRESSION: 1. Marked skin and trabecular thickening throughout the right breast. Through history provided by the patient's daughter, the patient has had significant edema throughout the right upper extremity following her stroke in early 2016. This may explain associated right breast edema.  2. Nipple inversion of uncertain chronicity given lack of comparisons.  3. No mammographic or targeted sonographic abnormalities at the site of pain in the upper-outer quadrant of the right breast.  4. Thrombophlebitis (Mondoor's disease) is noted in an area of discoloration in the upper inner quadrant of the right breast.  RECOMMENDATION: Clinical follow-up is recommended to evaluate and monitor the diffuse skin edema, nipple inversion and the discoloration in the upper inner quadrant of the right breast. If there is clinical concern for an inflammatory breast cancer, skin punch biopsy should be considered.  I have discussed the findings and recommendations with the patient. Results were also provided in writing at the conclusion of the visit. If applicable, a reminder letter will be sent to the patient regarding the next appointment.  BI-RADS CATEGORY  2: Benign.   Electronically Signed   By: Ammie Ferrier M.D.   On: 08/14/2014 14:24    Assessment &  Plan:   [name] was seen today for urinary tract infection and anemia.  Diagnoses and all orders for this visit:  Urinary tract infection without hematuria, site unspecified- Our lab was not able to collect urine from her, nursing staff was not comfortable trying to get a catheterized specimen from her, we do not have anywhere to place this patient to do a safe genitourinary examination or to do a catheterization. We have asked that her home health care agency, while she is laying in the bed, did a cath specimen from her and send it for urinalysis and culture. -     Urinalysis, Routine w reflex microscopic (not at [facility]); Future -     CULTURE, URINE COMPREHENSIVE; Future  Primary localized osteoarthrosis, lower leg, unspecified laterality- will continue tramadol for pain -     traMADol (ULTRAM) 50 MG tablet; One po QID as needed for pain  Pernicious anemia- I will recheck her CBC and her vitamin levels. -     FeAsp-B12-FA-C-DSS-SuccAc-Zn (FERIVA 21/7) 75-1 MG TABS; Take 1 tablet by mouth daily.  Midline back pain, unspecified location -     traMADol (ULTRAM) 50 MG tablet; One po QID as needed for pain  Primary osteoarthritis involving multiple joints -     traMADol (ULTRAM) 50 MG tablet; One po QID as needed for pain  Embolic stroke  Acute pulmonary embolism- I will monitor her INR/pro time to see that she is adequately anticoagulated.  I have discontinued Ms. Olarte's iron polysaccharides and antiseptic oral rinse. I have also changed her traMADol. Additionally, I am having her start on [place] 21/7. Lastly, I am having her maintain her ALIGN, acetaminophen, DECUBI-VITE, propranolol, senna-docusate, omeprazole, diclofenac sodium, traZODone, gabapentin, and warfarin.  Meds ordered this encounter  Medications  . traMADol (ULTRAM) 50 MG tablet    Sig: One po QID as needed for pain    Dispense:  120 tablet    Refill:  5  . FeAsp-B12-FA-C-DSS-SuccAc-Zn (FERIVA 21/7) 75-1 MG TABS     Sig: Take 1 tablet by mouth daily.    Dispense:  28 tablet    Refill:  11     Follow-up: Return in about 3 months (around 11/21/2014).  Scarlette Calico, MD

## 2014-08-21 NOTE — Telephone Encounter (Signed)
If you can, please write a note to this effect; her mother has suffered a devastating stroke and her prognosis is not good. She will need to care for her mother for at least the next 3-4 months.

## 2014-08-21 NOTE — Telephone Encounter (Signed)
A user error has taken place.

## 2014-08-21 NOTE — Progress Notes (Signed)
I have reviewed and agree with the plan. 

## 2014-08-21 NOTE — Patient Instructions (Signed)
Iron Deficiency Anemia Anemia is a condition in which there are less red blood cells or hemoglobin in the blood than normal. Hemoglobin is the part of red blood cells that carries oxygen. Iron deficiency anemia is anemia caused by too little iron. It is the most common type of anemia. It may leave you tired and short of breath. CAUSES   Lack of iron in the diet.  Poor absorption of iron, as seen with intestinal disorders.  Intestinal bleeding.  Heavy periods. SIGNS AND SYMPTOMS  Mild anemia may not be noticeable. Symptoms may include:  Fatigue.  Headache.  Pale skin.  Weakness.  Tiredness.  Shortness of breath.  Dizziness.  Cold hands and feet.  Fast or irregular heartbeat. DIAGNOSIS  Diagnosis requires a thorough evaluation and physical exam by your health care provider. Blood tests are generally used to confirm iron deficiency anemia. Additional tests may be done to find the underlying cause of your anemia. These may include:  Testing for blood in the stool (fecal occult blood test).  A procedure to see inside the colon and rectum (colonoscopy).  A procedure to see inside the esophagus and stomach (endoscopy). TREATMENT  Iron deficiency anemia is treated by correcting the cause of the deficiency. Treatment may involve:  Adding iron-rich foods to your diet.  Taking iron supplements. Pregnant or breastfeeding women need to take extra iron because their normal diet usually does not provide the required amount.  Taking vitamins. Vitamin C improves the absorption of iron. Your health care provider may recommend that you take your iron tablets with a glass of orange juice or vitamin C supplement.  Medicines to make heavy menstrual flow lighter.  Surgery. HOME CARE INSTRUCTIONS   Take iron as directed by your health care provider.  If you cannot tolerate taking iron supplements by mouth, talk to your health care provider about taking them through a vein  (intravenously) or an injection into a muscle.  For the best iron absorption, iron supplements should be taken on an empty stomach. If you cannot tolerate them on an empty stomach, you may need to take them with food.  Do not drink milk or take antacids at the same time as your iron supplements. Milk and antacids may interfere with the absorption of iron.  Iron supplements can cause constipation. Make sure to include fiber in your diet to prevent constipation. A stool softener may also be recommended.  Take vitamins as directed by your health care provider.  Eat a diet rich in iron. Foods high in iron include liver, lean beef, whole-grain bread, eggs, dried fruit, and dark green leafy vegetables. SEEK IMMEDIATE MEDICAL CARE IF:   You faint. If this happens, do not drive. Call your local emergency services (911 in [place] if no other help is available.  You have chest pain.  You feel nauseous or vomit.  You have severe or increased shortness of breath with activity.  You feel weak.  You have a rapid heartbeat.  You have unexplained sweating.  You become light-headed when getting up from a chair or bed. MAKE SURE YOU:   Understand these instructions.  Will watch your condition.  Will get help right away if you are not doing well or get worse. Document Released: 12/20/1999 Document Revised: 12/27/2012 Document Reviewed: 08/29/2012 ExitCare Patient Information 2015 [employer]. This information is not intended to replace advice given to you by your health care provider. Make sure you discuss any questions you have with your health care provider.  

## 2014-08-21 NOTE — Progress Notes (Signed)
Pre visit review using our clinic review tool, if applicable. No additional management support is needed unless otherwise documented below in the visit note. 

## 2014-08-21 NOTE — Telephone Encounter (Signed)
Pt daughter requesting work note stating she is taking care of her mother. She said she needs prognosis and how long her mother will be needing care ?

## 2014-08-21 NOTE — Telephone Encounter (Signed)
Pt daughter stated when ultrasound was done. MD stated there was thickening of skin only on RT breast? Forgot to mention during OV. Any advice?

## 2014-08-22 ENCOUNTER — Other Ambulatory Visit (INDEPENDENT_AMBULATORY_CARE_PROVIDER_SITE_OTHER): Payer: Medicare Other

## 2014-08-22 ENCOUNTER — Encounter: Payer: Self-pay | Admitting: Internal Medicine

## 2014-08-22 ENCOUNTER — Telehealth: Payer: Self-pay | Admitting: Internal Medicine

## 2014-08-22 ENCOUNTER — Other Ambulatory Visit: Payer: Self-pay | Admitting: Internal Medicine

## 2014-08-22 DIAGNOSIS — R300 Dysuria: Secondary | ICD-10-CM | POA: Diagnosis not present

## 2014-08-22 DIAGNOSIS — I69320 Aphasia following cerebral infarction: Secondary | ICD-10-CM | POA: Diagnosis not present

## 2014-08-22 DIAGNOSIS — I69351 Hemiplegia and hemiparesis following cerebral infarction affecting right dominant side: Secondary | ICD-10-CM | POA: Diagnosis not present

## 2014-08-22 LAB — URINALYSIS, ROUTINE W REFLEX MICROSCOPIC
Bilirubin Urine: NEGATIVE
Nitrite: POSITIVE — AB
Specific Gravity, Urine: 1.025 (ref 1.000–1.030)
Total Protein, Urine: NEGATIVE
Urine Glucose: NEGATIVE
Urobilinogen, UA: 0.2 (ref 0.0–1.0)
pH: 6 (ref 5.0–8.0)

## 2014-08-22 NOTE — Telephone Encounter (Signed)
[name] from [facility] needs a call back asap regarding an order nurse visit.  [phone]

## 2014-08-23 ENCOUNTER — Telehealth: Payer: Self-pay | Admitting: Internal Medicine

## 2014-08-23 ENCOUNTER — Other Ambulatory Visit: Payer: Self-pay | Admitting: Internal Medicine

## 2014-08-23 DIAGNOSIS — R319 Hematuria, unspecified: Principal | ICD-10-CM

## 2014-08-23 DIAGNOSIS — N390 Urinary tract infection, site not specified: Secondary | ICD-10-CM

## 2014-08-23 MED ORDER — CIPROFLOXACIN HCL 250 MG PO TABS: 250.0000 mg | ORAL_TABLET | Freq: Two times a day (BID) | ORAL | Status: AC

## 2014-08-23 NOTE — Telephone Encounter (Signed)
Don from [facility] called and wants to  Extend OT for next week. One home visit add Interventions and goals for care giver training for bed mobility and hoyer lift transfers

## 2014-08-23 NOTE — Telephone Encounter (Signed)
Order for nurse to collect UA on pt sent.

## 2014-08-24 DIAGNOSIS — I69320 Aphasia following cerebral infarction: Secondary | ICD-10-CM | POA: Diagnosis not present

## 2014-08-24 DIAGNOSIS — I69351 Hemiplegia and hemiparesis following cerebral infarction affecting right dominant side: Secondary | ICD-10-CM | POA: Diagnosis not present

## 2014-08-24 LAB — CULTURE, URINE COMPREHENSIVE: Colony Count: >=100000

## 2014-08-24 NOTE — Telephone Encounter (Signed)
Spoke to [facility] and gave verbal okay for below request.

## 2014-08-27 DIAGNOSIS — I69320 Aphasia following cerebral infarction: Secondary | ICD-10-CM | POA: Diagnosis not present

## 2014-08-27 DIAGNOSIS — I69351 Hemiplegia and hemiparesis following cerebral infarction affecting right dominant side: Secondary | ICD-10-CM | POA: Diagnosis not present

## 2014-08-28 ENCOUNTER — Telehealth: Payer: Self-pay | Admitting: Internal Medicine

## 2014-08-28 NOTE — Telephone Encounter (Signed)
Spoke to Dr. Ronnald Ramp. He stated that he sent in a refill to ensure that the infection cleared. Pt dtr informed of same and stated understanding.

## 2014-08-28 NOTE — Telephone Encounter (Signed)
Patients daughter Sandy Barrett called regarding her Cipro. There is a refill on this. She is wondering if she should get it refilled right away or wait Please call her

## 2014-08-29 ENCOUNTER — Ambulatory Visit (INDEPENDENT_AMBULATORY_CARE_PROVIDER_SITE_OTHER): Payer: Medicare Other | Admitting: General Practice

## 2014-08-29 DIAGNOSIS — I639 Cerebral infarction, unspecified: Secondary | ICD-10-CM

## 2014-08-29 DIAGNOSIS — I2699 Other pulmonary embolism without acute cor pulmonale: Secondary | ICD-10-CM

## 2014-08-29 DIAGNOSIS — Z5181 Encounter for therapeutic drug level monitoring: Secondary | ICD-10-CM

## 2014-08-29 DIAGNOSIS — I69351 Hemiplegia and hemiparesis following cerebral infarction affecting right dominant side: Secondary | ICD-10-CM | POA: Diagnosis not present

## 2014-08-29 DIAGNOSIS — I82403 Acute embolism and thrombosis of unspecified deep veins of lower extremity, bilateral: Secondary | ICD-10-CM

## 2014-08-29 DIAGNOSIS — I69320 Aphasia following cerebral infarction: Secondary | ICD-10-CM | POA: Diagnosis not present

## 2014-08-29 DIAGNOSIS — I63311 Cerebral infarction due to thrombosis of right middle cerebral artery: Secondary | ICD-10-CM

## 2014-08-29 LAB — POCT INR: INR: 1.8

## 2014-08-29 NOTE — Progress Notes (Signed)
I have reviewed and agree with the plan. 

## 2014-08-29 NOTE — Progress Notes (Signed)
Pre visit review using our clinic review tool, if applicable. No additional management support is needed unless otherwise documented below in the visit note. 

## 2014-08-30 ENCOUNTER — Telehealth: Payer: Self-pay | Admitting: Internal Medicine

## 2014-08-30 ENCOUNTER — Telehealth: Payer: Self-pay

## 2014-08-30 DIAGNOSIS — I69351 Hemiplegia and hemiparesis following cerebral infarction affecting right dominant side: Secondary | ICD-10-CM | POA: Diagnosis not present

## 2014-08-30 DIAGNOSIS — I69320 Aphasia following cerebral infarction: Secondary | ICD-10-CM | POA: Diagnosis not present

## 2014-08-30 NOTE — Telephone Encounter (Signed)
Sandy Barrett is calling to report that the current BP reading is 90/60 after a 90/62 reading. Please contact the caregiver @ the home # and also Don at the second # listed.   Sandy Barrett also asks that once patient is finished with [facility], requesting patient to go to outpatient for right hand splint as her current splint appears to not be comfortable for her. She does not want to wear the current splint that she has.

## 2014-08-30 NOTE — Telephone Encounter (Signed)
Pt daughter requesting note stating mother prognosis and expected time she will need a caregiver so she can take to work. Pt mentioned she was on FMLA and needed an ext her time was running out.

## 2014-08-31 NOTE — Telephone Encounter (Signed)
Pt dtr stated that FMLA is running out. We did not fill out the first (to our knowledge) and need the original and new FMLA (so pt dtr can refile).  Pt dtr stated that she would get [place] the information we have requested.

## 2014-09-03 ENCOUNTER — Other Ambulatory Visit: Payer: Self-pay

## 2014-09-03 ENCOUNTER — Telehealth: Payer: Self-pay

## 2014-09-03 NOTE — Telephone Encounter (Signed)
A user error has taken place.

## 2014-09-03 NOTE — Telephone Encounter (Signed)
Received an incoming fax with an attached copy of paper script for lidocaine patch and ultram 50 mg #120 wanted verification before filling. Informed pharmacy you did not prescribe the lidocaine and that script for the ultram was not yours. The date on the script was 07/06/14. Provider: Elige Radon.

## 2014-09-05 ENCOUNTER — Telehealth: Payer: Self-pay | Admitting: Internal Medicine

## 2014-09-05 DIAGNOSIS — I69320 Aphasia following cerebral infarction: Secondary | ICD-10-CM | POA: Diagnosis not present

## 2014-09-05 DIAGNOSIS — I69351 Hemiplegia and hemiparesis following cerebral infarction affecting right dominant side: Secondary | ICD-10-CM | POA: Diagnosis not present

## 2014-09-05 NOTE — Telephone Encounter (Signed)
Sandy Barrett called and her FMLA papers are very urgent as they are due today.  Her GTA form is not as urgent and can wait till Dr. Asa Lente comes back. She said anything you need let her know.

## 2014-09-06 NOTE — Telephone Encounter (Signed)
Left message for Darely Becknell to call back.   RE: Below FMLA forms.  Previous signed by another provider at another office in March 2016. That provider is the only one that can sign. We can do a new one but not addend an old one that we did not originally sign.

## 2014-09-06 NOTE — Telephone Encounter (Signed)
Spoke to [place] and she stated that she only needs a letter on company letter head that states she needs a leave of absence reason is due to the care of a parent.  PCP will be out until 09-11-14  I am sending this request to Dr. Ronnald Ramp.  The letter is due by Friday.  I have left a message the HR analyst with Teresa's (pt dtr) work informing pt PCP is out.

## 2014-09-07 ENCOUNTER — Telehealth: Payer: Self-pay | Admitting: Internal Medicine

## 2014-09-07 NOTE — Telephone Encounter (Signed)
Verbal okay given to extend  Speech Therapy

## 2014-09-07 NOTE — Telephone Encounter (Signed)
Sandy Barrett from [facility] would like to extend her speech therapy and would need orders

## 2014-09-07 NOTE — Telephone Encounter (Signed)
Letter printed and signed by dr Ronnald Ramp.

## 2014-09-10 DIAGNOSIS — I69320 Aphasia following cerebral infarction: Secondary | ICD-10-CM | POA: Diagnosis not present

## 2014-09-10 DIAGNOSIS — I69351 Hemiplegia and hemiparesis following cerebral infarction affecting right dominant side: Secondary | ICD-10-CM | POA: Diagnosis not present

## 2014-09-11 ENCOUNTER — Ambulatory Visit (INDEPENDENT_AMBULATORY_CARE_PROVIDER_SITE_OTHER): Payer: Medicare Other | Admitting: General Practice

## 2014-09-11 DIAGNOSIS — I639 Cerebral infarction, unspecified: Secondary | ICD-10-CM

## 2014-09-11 DIAGNOSIS — I82403 Acute embolism and thrombosis of unspecified deep veins of lower extremity, bilateral: Secondary | ICD-10-CM

## 2014-09-11 DIAGNOSIS — I69320 Aphasia following cerebral infarction: Secondary | ICD-10-CM | POA: Diagnosis not present

## 2014-09-11 DIAGNOSIS — I63311 Cerebral infarction due to thrombosis of right middle cerebral artery: Secondary | ICD-10-CM

## 2014-09-11 DIAGNOSIS — I69351 Hemiplegia and hemiparesis following cerebral infarction affecting right dominant side: Secondary | ICD-10-CM | POA: Diagnosis not present

## 2014-09-11 DIAGNOSIS — Z5181 Encounter for therapeutic drug level monitoring: Secondary | ICD-10-CM

## 2014-09-11 DIAGNOSIS — I2699 Other pulmonary embolism without acute cor pulmonale: Secondary | ICD-10-CM

## 2014-09-11 LAB — POCT INR: INR: 2

## 2014-09-11 NOTE — Progress Notes (Signed)
I have reviewed and agree with the plan. 

## 2014-09-11 NOTE — Progress Notes (Signed)
Pre visit review using our clinic review tool, if applicable. No additional management support is needed unless otherwise documented below in the visit note. 

## 2014-09-12 ENCOUNTER — Telehealth: Payer: Self-pay

## 2014-09-12 NOTE — Telephone Encounter (Signed)
Pt dtr informed that [employer] form is ready for pick up. It is up front.

## 2014-09-13 DIAGNOSIS — I69390 Apraxia following cerebral infarction: Secondary | ICD-10-CM | POA: Diagnosis not present

## 2014-09-13 DIAGNOSIS — I69320 Aphasia following cerebral infarction: Secondary | ICD-10-CM | POA: Diagnosis not present

## 2014-09-17 ENCOUNTER — Encounter: Payer: Medicare Other | Attending: Physical Medicine & Rehabilitation

## 2014-09-17 ENCOUNTER — Encounter: Payer: Self-pay | Admitting: Physical Medicine & Rehabilitation

## 2014-09-17 ENCOUNTER — Ambulatory Visit (HOSPITAL_BASED_OUTPATIENT_CLINIC_OR_DEPARTMENT_OTHER): Payer: Medicare Other | Admitting: Physical Medicine & Rehabilitation

## 2014-09-17 DIAGNOSIS — M7541 Impingement syndrome of right shoulder: Secondary | ICD-10-CM | POA: Insufficient documentation

## 2014-09-17 DIAGNOSIS — G8190 Hemiplegia, unspecified affecting unspecified side: Secondary | ICD-10-CM | POA: Insufficient documentation

## 2014-09-17 DIAGNOSIS — G8110 Spastic hemiplegia affecting unspecified side: Secondary | ICD-10-CM

## 2014-09-17 DIAGNOSIS — M6289 Other specified disorders of muscle: Secondary | ICD-10-CM | POA: Diagnosis not present

## 2014-09-17 DIAGNOSIS — G8111 Spastic hemiplegia affecting right dominant side: Secondary | ICD-10-CM | POA: Diagnosis not present

## 2014-09-17 DIAGNOSIS — I69320 Aphasia following cerebral infarction: Secondary | ICD-10-CM | POA: Insufficient documentation

## 2014-09-17 NOTE — Progress Notes (Signed)
Botox Injection for spasticity using needle EMG guidance  Dilution: 50 Units/ml Indication: Severe spasticity which interferes with ADL,mobility and/or  hygiene and is unresponsive to medication management and other conservative care Informed consent was obtained after describing risks and benefits of the procedure with the patient. This includes bleeding, bruising, infection, excessive weakness, or medication side effects. A [employer]S form is on file and signed. Needle: 25g 2" needle electrode Number of units per muscle Pectoralis0 Biceps0 FCR50  FDS50 FDP50 FPL25 PT25  All injections were done after obtaining appropriate EMG activity and after negative drawback for blood. The patient tolerated the procedure well. Post procedure instructions were given. A followup appointment was made.

## 2014-09-17 NOTE — Patient Instructions (Signed)

## 2014-09-18 ENCOUNTER — Encounter: Payer: Self-pay | Admitting: Physical Therapy

## 2014-09-18 ENCOUNTER — Ambulatory Visit: Payer: Medicare Other | Admitting: Internal Medicine

## 2014-09-18 ENCOUNTER — Ambulatory Visit: Payer: Medicare Other | Admitting: Occupational Therapy

## 2014-09-18 ENCOUNTER — Ambulatory Visit: Payer: Medicare Other | Attending: Physical Medicine & Rehabilitation | Admitting: Physical Therapy

## 2014-09-18 ENCOUNTER — Encounter: Payer: Self-pay | Admitting: Occupational Therapy

## 2014-09-18 DIAGNOSIS — R201 Hypoesthesia of skin: Secondary | ICD-10-CM

## 2014-09-18 DIAGNOSIS — R252 Cramp and spasm: Secondary | ICD-10-CM

## 2014-09-18 DIAGNOSIS — M25621 Stiffness of right elbow, not elsewhere classified: Secondary | ICD-10-CM | POA: Diagnosis not present

## 2014-09-18 DIAGNOSIS — I69951 Hemiplegia and hemiparesis following unspecified cerebrovascular disease affecting right dominant side: Secondary | ICD-10-CM

## 2014-09-18 DIAGNOSIS — M79641 Pain in right hand: Secondary | ICD-10-CM | POA: Insufficient documentation

## 2014-09-18 DIAGNOSIS — R414 Neurologic neglect syndrome: Secondary | ICD-10-CM | POA: Diagnosis not present

## 2014-09-18 DIAGNOSIS — M25511 Pain in right shoulder: Secondary | ICD-10-CM | POA: Insufficient documentation

## 2014-09-18 DIAGNOSIS — I69351 Hemiplegia and hemiparesis following cerebral infarction affecting right dominant side: Secondary | ICD-10-CM

## 2014-09-18 DIAGNOSIS — M623 Immobility syndrome (paraplegic): Secondary | ICD-10-CM | POA: Insufficient documentation

## 2014-09-18 DIAGNOSIS — R4189 Other symptoms and signs involving cognitive functions and awareness: Secondary | ICD-10-CM | POA: Diagnosis not present

## 2014-09-18 DIAGNOSIS — R609 Edema, unspecified: Secondary | ICD-10-CM | POA: Insufficient documentation

## 2014-09-18 DIAGNOSIS — M2560 Stiffness of unspecified joint, not elsewhere classified: Secondary | ICD-10-CM

## 2014-09-18 DIAGNOSIS — Z7409 Other reduced mobility: Secondary | ICD-10-CM | POA: Diagnosis not present

## 2014-09-18 DIAGNOSIS — R258 Other abnormal involuntary movements: Secondary | ICD-10-CM | POA: Insufficient documentation

## 2014-09-18 DIAGNOSIS — R2681 Unsteadiness on feet: Secondary | ICD-10-CM | POA: Insufficient documentation

## 2014-09-18 DIAGNOSIS — I69851 Hemiplegia and hemiparesis following other cerebrovascular disease affecting right dominant side: Secondary | ICD-10-CM | POA: Diagnosis not present

## 2014-09-18 NOTE — Patient Instructions (Signed)
Pt's daughter instructed regarding contraindications of sling.  Discussed alternative positioning and will continue to problem solve with pt and dtr. Also demonstrated appropriate way to support R shoulder girdle when pt is in Steady for standing so that dtr does not use sling as often. Dtr able to return demonstrate.  Discussed importance of wheelchair positioning and effect on RUE. Dtr to bring in patient's wheelchair and UE supports as well as splints at next session.  Educated dtr on what shoulder subluxation is and avoidance of raising arm or pulling on arm during mobility or ADL's - will continue to problem solve strategies with dtr in future sessions. Dtr able to verbalize understanding.

## 2014-09-18 NOTE — Therapy (Signed)
[facility] [address] [place], [place], [address] Phone: [phone]   Fax:  [phone]  Occupational Therapy Evaluation  Patient Details  Name: Sandy Barrett MRN: [id number] Date of Birth: 79-24-1934 Referring Provider:  Charlett Blake, MD  Encounter Date: 09/18/2014      OT End of Session - 09/18/14 2009    Visit Number 1   Number of Visits 16   Date for OT Re-Evaluation 11/13/14   Authorization Type medicare - pt will need G code and progress note every 10th visit   Authorization Time Period 60 days   OT Start Time 1150   OT Stop Time 1255   OT Time Calculation (min) 65 min   Activity Tolerance Patient tolerated treatment well      Past Medical History  Diagnosis Date  . HYPERTENSION   . HYPERTHYROIDISM   . OBESITY   . ARTHRITIS   . Posttraumatic stress disorder   . INSOMNIA   . MIGRAINE HEADACHE   . Hyperthyroidism     s/p I-131 ablation 03/2011 of multinod goiter  . Arthritis   . Stroke     Past Surgical History  Procedure Laterality Date  . Abdominal hysterectomy  1976  . Total hip arthroplasty  1998     right  . Tonsillectomy    . Breast surgery      biopsy  . Radiology with anesthesia Left 03/08/2014    Procedure: RADIOLOGY WITH ANESTHESIA;  Surgeon: Rob Hickman, MD;  Location: [facility];  Service: Radiology;  Laterality: Left;    There were no vitals filed for this visit.  Visit Diagnosis:  Hemiplegia affecting right side in right-dominant patient as late effect of cerebrovascular disease - Plan: Ot plan of care cert/re-cert  Spasticity - Plan: Ot plan of care cert/re-cert  Pain in joint, shoulder region, right - Plan: Ot plan of care cert/re-cert  Pain of right hand - Plan: Ot plan of care cert/re-cert  Edema - Plan: Ot plan of care cert/re-cert  Impaired cognition - Plan: Ot plan of care cert/re-cert  Right-sided visual neglect - Plan: Ot plan of care  cert/re-cert  Impaired sensation - Plan: Ot plan of care cert/re-cert  Stiffness of joint, upper arm, right - Plan: Ot plan of care cert/re-cert      Subjective Assessment - 09/18/14 1154    Subjective  Pt unable to produce any intelligible speech   Patient is accompained by: Family member  dtr   Pertinent History see epic snapshot,    Patient Stated Goals Pt unable to state due to global aphasia;  dtr states to improve in any way she can especially mobility, reduce pain   Currently in Pain? Yes  dtr states biggest pain area is LUE (entire arm), then also in R back, hip and knee. PT to address back hip and knee.  Pt unable to rate or describe pain.  Dtr notes she is uncomfortable any time they have to move her. Pain meds seem to help, some           [facility] OT Assessment - 09/18/14 1200    Assessment   Diagnosis Bil MCA CVA   Onset Date 03/08/14   Prior Therapy Inpt rehab,  SNF rehab, [facility] therapies (all 3 therapies)   Precautions   Precautions Fall   Restrictions   Weight Bearing Restrictions No   Balance Screen   Has the patient fallen in the past 6 months No   Home  Environment   Family/patient expects to be discharged to: Private residence   [place]  2 dtrs rotating in and out of pt's home   Available Help at Discharge Available PRN/intermittently   Type of [place] One level   Bathroom Shower/Tub Other (comment)  sponge bath in bed; pt cannot access bathroom   Prior Function   Level of Independence Independent   Vocation Retired   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, fast food   ADL   Eating/Feeding Minimal assistance  dtr reports she eats with hands/napkin vs utensils   Grooming --  max a pt does try and participate   Upper Body Bathing Maximal assistance   Lower Body Bathing + 1 Total assistance   Upper Body Dressing + 1 Total assistance   Lower Body Dressing +1 Total aassistance   Toilet Tranfer Other (comment)  pt  does not transfer to toilet- uses briefs   Toileting - Clothing Manipulation + 1 Total assistance   Toileting -  Hygiene + 1 Total assistance   Tub/Shower Transfer Other (comment)  cannot access bathroom so bathes in bed   ADL comments 3 in 1, hoyer, steady, sliding board.    IADL   Shopping Completely unable to shop   Light Housekeeping Does not participate in any housekeeping tasks   Meal Prep Needs to have meals prepared and served   Medication Management Is not capable of dispensing or managing own medication   Financial Management Dependent   Mobility   Mobility Status Needs assist   Written Expression   Dominant Hand Right   Vision - History   Baseline Vision Wears glasses only for reading   Vision Assessment   Comment Pt able to track objects into right fields however dtr reports that pt seems to "ignore" her when she sits on her right. Pt also has neck pain which may be impacting pt's ability to look to the right   Activity Tolerance   Activity Tolerance Tolerates 10-20 min activity with muiltiple rests   Cognition   Overall Cognitive Status Impaired/Different from baseline  unable to assess due to severe global aphasia   Sensation   Light Touch Impaired by gross assessment   Hot/Cold Impaired by gross assessment   Coordination   Gross Motor Movements are Fluid and Coordinated No   Fine Motor Movements are Fluid and Coordinated No   Other Pt has no active movement in RUE at this time.    Perception   Perception Impaired   Inattention/Neglect Does not attend to right visual field   Praxis   Praxis Impaired   Praxis Impairment Details Ideomotor   Edema   Edema significant edema in R hand and arm   Tone   Assessment Location Right Upper Extremity   ROM / Strength   AROM / PROM / Strength AROM;PROM;Strength   AROM   Overall AROM  Deficits   Overall AROM Comments No evidence of any active movement in RUE; dtr states she has never seen any active movement since pt has  had her stroke.   PROM   Overall PROM  Deficits   Overall PROM Comments Pt with significant pain in RUE as evidenced by grimacing with movement. Pt tolerated shoulder abduction to approximately 40* and shoulder flexion to approximately 50*. Pt with full elbow extension however postures in flexion and grimaces at full range of elbow extension. limited to neutral with wrist extension, wrist flexion WFL's; Pt limited in finger flexion (  composite flexion 75%) has full finger extension with wrist in neutral.   Strength   Overall Strength Deficits   Overall Strength Comments Unable to assess due to altered tone, global aphasia however pt did not exhibit any active movement in RUE and dtr also states she has never seen any active movement since the pt had her stroke.    Hand Function   Left Hand Gross Grasp Functional   Comment No active hand movement.  Pt positions R hand in flexed postion. Able to passively range fingers to full extension with wrist in neutral.    RUE Tone   RUE Tone Other (Comment)  flaccid proximally and mod hypertonic distally                         OT Education - 09/18/14 1955    Education provided Yes   Education Details RUE positioning, sling, subluxation   Person(s) Educated Patient;Child(ren)   Methods Explanation;Demonstration;Verbal cues   Comprehension Verbalized understanding;Returned demonstration          OT Short Term Goals - 09/18/14 1956    OT SHORT TERM GOAL #1   Title Pt and dtr will be mod I with HEP - 10/16/2014   Status New   OT SHORT TERM GOAL #2   Title Pt will tolerate UE positioning in wheelchair and bed to protect shoulder girdle, reduce edema and reduce pain   Status New   OT SHORT TERM GOAL #3   Title Pt's dtr will be I with retrograde massage to RUE   Status New   OT SHORT TERM GOAL #4   Title Pt will be able to sit EOM wth supervision only   Status New   OT SHORT TERM GOAL #5   Title Pt and dtr will be mod I with  splint wear and care   Status New           OT Long Term Goals - 09/18/14 2000    OT LONG TERM GOAL #1   Title Pt and dtr will be mod I with upgraded HEP prn - 11/13/2014   Status New   OT LONG TERM GOAL #2   Title Pt will be able to wash face with min a   Status New   OT LONG TERM GOAL #3   Title Pt will be able to eat at least 50% of her meal with utensil and min a   Status New   OT LONG TERM GOAL #4   Title Pt will require mod a to transfer to drop arm commode   Status New               Plan - 09/18/14 2002    Clinical Impression Statement Pt is n 81 year old female s/p Bil MCA CVA's in March 2016. Pt receivied inpt rehab, SNF rehab and [facility] therapies. Pt now presents to this clinic for outpatient therapy with the following deficits that impact pt and caregivers ability to provide basic ADL care for and with the pt:  severe R (dominanat) hemiplegia, altered tone (flacciid proximally, hypertonic distaly), decreased ROM, painful ahoulder and hand, signficant edema in RUE, decreased trunk control, decreased sitting balance, impared sensation, ideomotor apraxia, global aphasia, R inattention, impaired cognition.  Pt will benefit from skilled OT to address these deficits to aide in ease of assisting pt in basic ADL activities.   Pt will benefit from skilled therapeutic intervention in order to improve on the  following deficits (Retired) Decreased activity tolerance;Decreased balance;Decreased cognition;Decreased knowledge of use of DME;Decreased mobility;Decreased range of motion;Decreased safety awareness;Decreased strength;Increased edema;Impaired UE functional use;Impaired tone;Impaired sensation;Impaired flexibility;Impaired vision/preception;Pain   Rehab Potential Fair   Clinical Impairments Affecting Rehab Potential severity of deficits   OT Frequency 2x / week   OT Duration 8 weeks   OT Treatment/Interventions Self-care/ADL training;Ultrasound;Moist Heat;Contrast Bath;DME  and/or AE instruction;Neuromuscular education;Therapeutic exercise;Functional Mobility Training;Manual Therapy;Passive range of motion;Splinting;Therapeutic activities;Balance training;Patient/family education;Visual/perceptual remediation/compensation;Cognitive remediation/compensation   Plan initiate HEP   Consulted and Agree with Plan of Care Patient;Family member/caregiver   Family Member Consulted dtr          G-Codes - 09/30/2014 2013/04/17    Functional Assessment Tool Used clinical observation as pt is unable to participate in any standardized tests   Functional Limitation Self care  care of arm by pt and caregiver as well as reduce burden on caregiver for self care activities   Self Care Current Status [phone]) 100 percent impaired, limited or restricted   Self Care Goal Status (E8315) At least 80 percent but less than 100 percent impaired, limited or restricted      Problem List Patient Active Problem List   Diagnosis Date Noted  . Urinary tract infectious disease 08/21/2014  . Primary osteoarthritis involving multiple joints 07/26/2014  . Pernicious anemia 07/26/2014  . Low TSH level 07/26/2014  . Hearing loss 07/26/2014  . Decreased vision in both eyes 07/26/2014  . Encounter for therapeutic drug monitoring 07/17/2014  . Spastic hemiparesis affecting dominant side 05/31/2014  . Acute pulmonary embolism 03/14/2014  . DVT of lower extremity, bilateral 03/14/2014  . Global aphasia 03/14/2014  . 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 Dysphagia due to recent stroke 03/14/2014  . Aphasia due to stroke 03/13/2014  . Right hemiparesis 03/13/2014  . Embolic stroke   . Cerebral infarction due to embolism of left middle cerebral artery   . Cerebral infarction due to unspecified mechanism   . Cerebral infarction due to thrombosis of right middle cerebral artery   . Stroke 03/08/2014  . Pulmonary embolism 03/08/2014  . CVA (cerebral infarction)   . Right sided weakness   . Back pain  08/29/2013  . Primary localized osteoarthrosis, lower leg 03/06/2013  . IBS (irritable bowel syndrome)   . 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 Multinodular goiter 01/13/2011  . Abnormal chest x-ray 12/12/2010  . HYPERTHYROIDISM 11/11/2009  . Insomnia 07/03/2009  . POSTTRAUMATIC STRESS DISORDER 08/22/2008  . OBESITY 04/21/2008  . Osteoarthritis 04/21/2008  . MIGRAINE HEADACHE 04/20/2008  . Essential hypertension 04/20/2008    Quay Burow, OTR/L 09/30/14, 8:18 PM  [facility] [address] [place], [place], [address] Phone: [phone]   Fax:  [phone]

## 2014-09-19 ENCOUNTER — Telehealth: Payer: Self-pay | Admitting: *Deleted

## 2014-09-19 DIAGNOSIS — I69390 Apraxia following cerebral infarction: Secondary | ICD-10-CM | POA: Diagnosis not present

## 2014-09-19 DIAGNOSIS — I69320 Aphasia following cerebral infarction: Secondary | ICD-10-CM | POA: Diagnosis not present

## 2014-09-19 NOTE — Therapy (Signed)
[facility] [address] [place], [place], [address] Phone: [phone]   Fax:  [phone]  Physical Therapy Evaluation  Patient Details  Name: Sandy Barrett MRN: [id number] Date of Birth: 29-May-1932 Referring Provider:  Charlett Blake, MD  Encounter Date: 09/18/2014      PT End of Session - 09/18/14 1100    Visit Number 1   Number of Visits 17   Date for PT Re-Evaluation 11/16/14   Authorization Type Medicare G-Code every 10th visit   PT Start Time 1110   PT Stop Time 1145   PT Time Calculation (min) 35 min   Equipment Utilized During Treatment Gait belt   Activity Tolerance Patient tolerated treatment well;Patient limited by pain   Behavior During Therapy [facility] for tasks assessed/performed      Past Medical History  Diagnosis Date  . HYPERTENSION   . HYPERTHYROIDISM   . OBESITY   . ARTHRITIS   . Posttraumatic stress disorder   . INSOMNIA   . MIGRAINE HEADACHE   . Hyperthyroidism     s/p I-131 ablation 03/2011 of multinod goiter  . Arthritis   . Stroke     Past Surgical History  Procedure Laterality Date  . Abdominal hysterectomy  1976  . Total hip arthroplasty  1998     right  . Tonsillectomy    . Breast surgery      biopsy  . Radiology with anesthesia Left 03/08/2014    Procedure: RADIOLOGY WITH ANESTHESIA;  Surgeon: Rob Hickman, MD;  Location: [facility];  Service: Radiology;  Laterality: Left;    There were no vitals filed for this visit.  Visit Diagnosis:  Hemiparesis affecting right side as late effect of cerebrovascular accident  Decreased transfer ability  Unsteadiness  Stiffness due to immobility      Subjective Assessment - 09/18/14 1119    Subjective This 81yo female had CVA on 03/08/2014. She was inpatient rehab for ~1 month, then went to [facility] ~3 months (d/c July-Aug). Home health thru last week for OT & PT; Speech is continuing.. She presents for PT  & OT  evaluation.    Patient is accompained by: Family member   Patient Stated Goals Her daughter reports would like to improve her strength and ability to move around the house.    Currently in Pain? Yes  left hip & knee, right arm , aphasia prevents rating, family is using non-verbals to note pain            [facility] PT Assessment - 09/18/14 1100    Assessment   Medical Diagnosis CVA   Onset Date/Surgical Date 03/08/14   Precautions   Precautions Fall   Restrictions   Weight Bearing Restrictions No   Balance Screen   Has the patient fallen in the past 6 months No   Has the patient had a decrease in activity level because of a fear of falling?  Yes   Is the patient reluctant to leave their home because of a fear of falling?  No  stays in w/c   Home Environment   Living Environment Private residence   Living Arrangements Children  adult daughter has moved into pt's house   Available Help at Discharge Available 24 hours/day   Type of [place] entrance  current ramp is temporary, but plans to build one   [place] One level  bathroom is not w/c accessible   Administrator, arts -  manual;Hospital bed;Cane - quad  hoyer & Sit to stand/steady   Prior Function   Level of Independence Independent;Independent with household mobility without device;Independent with community mobility without device;Independent with gait   Vocation Retired   Leisure hanging out with family & shopping   Observation/Other Assessments   Focus on Therapeutic Outcomes (FOTO)  NP   ROM / Strength   AROM / PROM / Strength Strength   Strength   Overall Strength Deficits;Due to impaired cognition  difficult to assess   Overall Strength Comments LEs attempted testing in sitting: Left hip flexion <3-/5, left knee extension 3-/5, left ankle dorsiflexion 3/5; right hip/knee ~1/5, ankle 0/5   Transfers   Transfers Lateral/Scoot Transfers   Lateral/Scoot Transfers 1: +1 Total assist;With  armrests removed;With slide board  level surfaces   Lateral/Scoot Transfer Details (indicate cue type and reason) Manual, verbal & tactile cues for technique; Pt is a "pusher"   Ambulation/Gait   Ambulation/Gait No   Balance   Balance Assessed Yes   Static Sitting Balance   Static Sitting - Balance Support No upper extremity supported;Feet supported   Static Sitting - Level of Assistance 5: Stand by assistance   Static Sitting - Comment/# of Minutes sat at edge of mat without back or UE support for 2 minutes.   Dynamic Sitting Balance   Dynamic Sitting - Balance Support Left upper extremity supported;Feet supported   Dynamic Sitting - Level of Assistance 4: Min assist   Dynamic Sitting Balance - Compensations Right side neglect noted.    Reach (Patient is able to reach ___ inches to right, left, forward, back) LUE: 2" anteriorly but could not get her to reach across mid-line   Dynamic Sitting - Balance Activities Reaching for objects;Head control activities                             PT Short Term Goals - 09/18/14 1100    PT SHORT TERM GOAL #1   Title Family demonstrates updated, initial HEP correctly with minimal cues. (Target Date: 10/18/2014)   Time 4   Period Weeks   Status New   PT SHORT TERM GOAL #2   Title Sliding Board Transfers w/c to level surface with maximal assist.  (Target Date: 10/18/2014)   Time 4   Period Weeks   Status New   PT SHORT TERM GOAL #3   Title Patient performs sitting balance at edge of mat reaching 2" anteriorly & left and across midline with left UE with contact assist.  (Target Date: 10/18/2014)   Time 4   Period Weeks   Status New           PT Long Term Goals - 09/18/14 1100    PT LONG TERM GOAL #1   Title Family demonstrates updated HEP and verbalizes benefits for decreasing LE pain associated with stiffness.  (Target Date: 11/16/2014)   Time 8   Period Weeks   Status New   PT LONG TERM GOAL #2   Title Family  performs car transfer with patient with sliding board safely.  (Target Date: 11/16/2014)   Time 8   Period Weeks   Status New   PT LONG TERM GOAL #3   Title Patient able to reach 5" anteriorly, left & across midline with left UE sitting at edge of mat or w/c with feet on floor with supervision.  (Target Date: 11/16/2014)   Time 8   Period Weeks  Status New   PT LONG TERM GOAL #4   Title Patient able to roll left & right with rail and boost buttocks in bed to aide with self-care with cues only.  (Target Date: 11/16/2014)   Time 8   Period Weeks   Status New               Plan - 2014-10-03 1145    Clinical Impression Statement This 81yo had bilateral MCA CVA on 3/32016 with significant right side hemiplegia and aphasia. She has strong family support in 2 daughters who are providing 24hr care. Patient is totally dependent for all transfers and mobility. Family has to use [place] lift or Steady for transfers. They have to rent vehicles to transport her to doctor appointments so she can stay in her w/c due to transfer difficulty. Patient would benefit from skilled care to address transfers and burden of care for family.   Pt will benefit from skilled therapeutic intervention in order to improve on the following deficits Abnormal gait;Decreased activity tolerance;Decreased balance;Decreased mobility;Decreased range of motion;Decreased strength;Pain   Rehab Potential Good   PT Frequency 2x / week   PT Duration 8 weeks   PT Treatment/Interventions ADLs/Self Care Home Management;DME Instruction;Functional mobility training;Therapeutic activities;Therapeutic exercise;Balance training;Neuromuscular re-education;Patient/family education;Wheelchair mobility training   PT Next Visit Plan Check current HEP and instruct / update HEP family education   Recommended Other Services PT phoned [facility] to try to get authorization appt moved up; PT also requested Speech Therapy referral as family wanted to  continue [facility] but Medicare will not allow 2 companies to bill therapy services.   Consulted and Agree with Plan of Care Patient;Family member/caregiver   Family Member Consulted dtr, Evlyn Kanner - October 03, 2014 1100    Functional Assessment Tool Used Total Assist transfers with sliding board w/c to level mat.   Functional Limitation Changing and maintaining body position   Changing and Maintaining Body Position Current Status [phone]) 100 percent impaired, limited or restricted   Changing and Maintaining Body Position Goal Status (M8413) At least 80 percent but less than 100 percent impaired, limited or restricted       Problem List Patient Active Problem List   Diagnosis Date Noted  . Urinary tract infectious disease 08/21/2014  . Primary osteoarthritis involving multiple joints 07/26/2014  . Pernicious anemia 07/26/2014  . Low TSH level 07/26/2014  . Hearing loss 07/26/2014  . Decreased vision in both eyes 07/26/2014  . Encounter for therapeutic drug monitoring 07/17/2014  . Spastic hemiparesis affecting dominant side 05/31/2014  . Acute pulmonary embolism 03/14/2014  . DVT of lower extremity, bilateral 03/14/2014  . Global aphasia 03/14/2014  . 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 Dysphagia due to recent stroke 03/14/2014  . Aphasia due to stroke 03/13/2014  . Right hemiparesis 03/13/2014  . Embolic stroke   . Cerebral infarction due to embolism of left middle cerebral artery   . Cerebral infarction due to unspecified mechanism   . Cerebral infarction due to thrombosis of right middle cerebral artery   . Stroke 03/08/2014  . Pulmonary embolism 03/08/2014  . CVA (cerebral infarction)   . Right sided weakness   . Back pain 08/29/2013  . Primary localized osteoarthrosis, lower leg 03/06/2013  . IBS (irritable bowel syndrome)   . 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 Multinodular goiter 01/13/2011  . Abnormal chest x-ray 12/12/2010  . HYPERTHYROIDISM 11/11/2009  .  Insomnia 07/03/2009  . POSTTRAUMATIC  STRESS DISORDER 08/22/2008  . OBESITY 04/21/2008  . Osteoarthritis 04/21/2008  . MIGRAINE HEADACHE 04/20/2008  . Essential hypertension 04/20/2008    Craven Crean PT, DPT 09/19/2014, 10:08 AM  [facility] [address] [place], [place], [address] Phone: [phone]   Fax:  [phone]

## 2014-09-20 DIAGNOSIS — I69320 Aphasia following cerebral infarction: Secondary | ICD-10-CM | POA: Diagnosis not present

## 2014-09-20 DIAGNOSIS — I69390 Apraxia following cerebral infarction: Secondary | ICD-10-CM | POA: Diagnosis not present

## 2014-09-24 DIAGNOSIS — I69320 Aphasia following cerebral infarction: Secondary | ICD-10-CM | POA: Diagnosis not present

## 2014-09-24 DIAGNOSIS — I69390 Apraxia following cerebral infarction: Secondary | ICD-10-CM | POA: Diagnosis not present

## 2014-09-25 ENCOUNTER — Telehealth: Payer: Self-pay | Admitting: Internal Medicine

## 2014-09-25 MED ORDER — METRONIDAZOLE 250 MG PO TABS: 250.0000 mg | ORAL_TABLET | Freq: Two times a day (BID) | ORAL | Status: AC

## 2014-09-25 NOTE — Telephone Encounter (Signed)
Sandy Barrett called to advise that discharge previously observed has gotten worse and changed color. She is requesting advice on how to proceed.

## 2014-09-25 NOTE — Telephone Encounter (Signed)
Pt dtr informed 

## 2014-09-25 NOTE — Telephone Encounter (Signed)
I think she has bacterial vaginosis. I have sent an antibiotic to her pharmacy to treat this.

## 2014-09-25 NOTE — Telephone Encounter (Signed)
At the last visit after abx were started the urine looked to be clearing up.  The color of the urine has turned darker and there seems to be a discharge and odor even when the pt is dry. These sx started yesterday (09/24/14).  Pt dtr states there is a discharge coming from the front (anterior side of genitourinary area).  The only out of the ordinary behavior is the pt is acting more lethargic and sleeping more during the day (more than usual).

## 2014-09-26 ENCOUNTER — Ambulatory Visit (INDEPENDENT_AMBULATORY_CARE_PROVIDER_SITE_OTHER): Payer: Medicare Other | Admitting: General Practice

## 2014-09-26 DIAGNOSIS — Z5181 Encounter for therapeutic drug level monitoring: Secondary | ICD-10-CM | POA: Diagnosis not present

## 2014-09-26 DIAGNOSIS — I63311 Cerebral infarction due to thrombosis of right middle cerebral artery: Secondary | ICD-10-CM | POA: Diagnosis not present

## 2014-09-26 DIAGNOSIS — I82403 Acute embolism and thrombosis of unspecified deep veins of lower extremity, bilateral: Secondary | ICD-10-CM

## 2014-09-26 DIAGNOSIS — I2699 Other pulmonary embolism without acute cor pulmonale: Secondary | ICD-10-CM

## 2014-09-26 DIAGNOSIS — I639 Cerebral infarction, unspecified: Secondary | ICD-10-CM

## 2014-09-26 LAB — POCT INR: INR: 1.5

## 2014-09-26 NOTE — Progress Notes (Signed)
Pre visit review using our clinic review tool, if applicable. No additional management support is needed unless otherwise documented below in the visit note. 

## 2014-09-26 NOTE — Progress Notes (Signed)
I have reviewed and agree with the plan. 

## 2014-09-27 DIAGNOSIS — I69320 Aphasia following cerebral infarction: Secondary | ICD-10-CM | POA: Diagnosis not present

## 2014-09-27 DIAGNOSIS — I69390 Apraxia following cerebral infarction: Secondary | ICD-10-CM | POA: Diagnosis not present

## 2014-10-01 DIAGNOSIS — I69320 Aphasia following cerebral infarction: Secondary | ICD-10-CM | POA: Diagnosis not present

## 2014-10-01 DIAGNOSIS — I69390 Apraxia following cerebral infarction: Secondary | ICD-10-CM | POA: Diagnosis not present

## 2014-10-03 DIAGNOSIS — I69390 Apraxia following cerebral infarction: Secondary | ICD-10-CM | POA: Diagnosis not present

## 2014-10-03 DIAGNOSIS — I69320 Aphasia following cerebral infarction: Secondary | ICD-10-CM | POA: Diagnosis not present

## 2014-10-09 ENCOUNTER — Ambulatory Visit: Payer: Medicare Other | Attending: Physical Medicine & Rehabilitation | Admitting: Physical Therapy

## 2014-10-09 ENCOUNTER — Ambulatory Visit: Payer: Medicare Other

## 2014-10-09 ENCOUNTER — Ambulatory Visit: Payer: Medicare Other | Admitting: Occupational Therapy

## 2014-10-09 DIAGNOSIS — M79602 Pain in left arm: Secondary | ICD-10-CM | POA: Insufficient documentation

## 2014-10-09 DIAGNOSIS — M2560 Stiffness of unspecified joint, not elsewhere classified: Secondary | ICD-10-CM

## 2014-10-09 DIAGNOSIS — R252 Cramp and spasm: Secondary | ICD-10-CM

## 2014-10-09 DIAGNOSIS — I69351 Hemiplegia and hemiparesis following cerebral infarction affecting right dominant side: Secondary | ICD-10-CM | POA: Diagnosis not present

## 2014-10-09 DIAGNOSIS — M79641 Pain in right hand: Secondary | ICD-10-CM | POA: Insufficient documentation

## 2014-10-09 DIAGNOSIS — R414 Neurologic neglect syndrome: Secondary | ICD-10-CM | POA: Diagnosis not present

## 2014-10-09 DIAGNOSIS — M623 Immobility syndrome (paraplegic): Secondary | ICD-10-CM | POA: Diagnosis not present

## 2014-10-09 DIAGNOSIS — R258 Other abnormal involuntary movements: Secondary | ICD-10-CM | POA: Insufficient documentation

## 2014-10-09 DIAGNOSIS — Z7409 Other reduced mobility: Secondary | ICD-10-CM | POA: Insufficient documentation

## 2014-10-09 DIAGNOSIS — I69951 Hemiplegia and hemiparesis following unspecified cerebrovascular disease affecting right dominant side: Secondary | ICD-10-CM | POA: Insufficient documentation

## 2014-10-09 DIAGNOSIS — IMO0002 Reserved for concepts with insufficient information to code with codable children: Secondary | ICD-10-CM

## 2014-10-09 DIAGNOSIS — M25621 Stiffness of right elbow, not elsewhere classified: Secondary | ICD-10-CM | POA: Diagnosis not present

## 2014-10-09 DIAGNOSIS — M25511 Pain in right shoulder: Secondary | ICD-10-CM | POA: Insufficient documentation

## 2014-10-09 DIAGNOSIS — R600 Localized edema: Secondary | ICD-10-CM | POA: Insufficient documentation

## 2014-10-09 DIAGNOSIS — M79605 Pain in left leg: Secondary | ICD-10-CM | POA: Diagnosis not present

## 2014-10-09 DIAGNOSIS — R4701 Aphasia: Secondary | ICD-10-CM | POA: Insufficient documentation

## 2014-10-09 DIAGNOSIS — R2681 Unsteadiness on feet: Secondary | ICD-10-CM | POA: Diagnosis not present

## 2014-10-09 DIAGNOSIS — R482 Apraxia: Secondary | ICD-10-CM | POA: Insufficient documentation

## 2014-10-09 DIAGNOSIS — R201 Hypoesthesia of skin: Secondary | ICD-10-CM | POA: Diagnosis not present

## 2014-10-09 DIAGNOSIS — R4189 Other symptoms and signs involving cognitive functions and awareness: Secondary | ICD-10-CM | POA: Diagnosis not present

## 2014-10-09 NOTE — Therapy (Signed)
[facility] [address] [place], [place], [address] Phone: [phone]   Fax:  [phone]  Physical Therapy Treatment  Patient Details  Name: Sandy Barrett MRN: [id number] Date of Birth: August 07, 1932 Referring Provider:  Rowe Clack, MD  Encounter Date: 10/09/2014      PT End of Session - 10/09/14 1649    Visit Number 2   Number of Visits 17   Date for PT Re-Evaluation 11/16/14   Authorization Type Medicare G-Code every 10th visit   PT Start Time 1400   PT Stop Time 1445   PT Time Calculation (min) 45 min   Equipment Utilized During Treatment Gait belt   Activity Tolerance Patient tolerated treatment well;Patient limited by pain   Behavior During Therapy [facility] for tasks assessed/performed      Past Medical History  Diagnosis Date  . HYPERTENSION   . HYPERTHYROIDISM   . OBESITY   . ARTHRITIS   . Posttraumatic stress disorder   . INSOMNIA   . MIGRAINE HEADACHE   . Hyperthyroidism     s/p I-131 ablation 03/2011 of multinod goiter  . Arthritis   . Stroke     Past Surgical History  Procedure Laterality Date  . Abdominal hysterectomy  1976  . Total hip arthroplasty  1998     right  . Tonsillectomy    . Breast surgery      biopsy  . Radiology with anesthesia Left 03/08/2014    Procedure: RADIOLOGY WITH ANESTHESIA;  Surgeon: Rob Hickman, MD;  Location: [facility];  Service: Radiology;  Laterality: Left;    There were no vitals filed for this visit.  Visit Diagnosis:  Spasticity  Decreased transfer ability  Unsteadiness  Hemiparesis affecting right side as late effect of cerebrovascular accident ([place])  Stiffness due to immobility  Treatment: Max assist for slide board transfer from wheelchair to mat table going toward left side. Max multimodal cues on sequence and technique.  Seated edge of mat: Worked on anterior/posterior weight shifting for improved midline positioning and to  promote anterior weight shifting for pre-standing activities.  Attempted to have pt reach with left UE toward targets, pt not able to reach out to higher or left lateral targets, ? Pain in shoulder as she would grimace and daughter reported OA prior to stroke. (daughter also stated pt had torn rotator cuff, however she felt sure this was the left shoulder).  Seated exercises: Long arc quads x 5 active on left side, x 5 passive on right side Marching x 5 active on left side, x 5 passive on right side.  Mod assist to lie down into supine/hooklying on mat, towards the left side with max cues.  Hooklying with bil legs over red pball: - short arc quads, x 5 active on left, x 5 passive on right - right leg hip/knee flexion/extension while left leg stayed on ball, 9-10 reps with cues/facilitaiton to have pt assist with extension. Trace contraction noted. - right leg fall out/return in, trace contractions noted with adductors with facilitation/cues.  - lower trunk rotation  Hooklying without ball: mini bridges with cues to "push" down through both legs, pelvic movement noted without buttock clearing the mat. 10 reps. Right leg fallouts x 10 reps with cues/facilitation. Trace to min contraction noted.   max assist to roll to right side and to sit up on edge of mat with max multimodal cues.  Attempted sit<>stand with pt using left arm to pull up on chair armrest,  right leg blocked to prevent buckling and sheet used at pelvis for control/assist to promote standing. 2cd person for safety. Unable to achieve standing on 1st attempt, did achieve partial upright standing on 2cd attempt.        PT Short Term Goals - 09/18/14 1100    PT SHORT TERM GOAL #1   Title Family demonstrates updated, initial HEP correctly with minimal cues. (Target Date: 10/18/2014)   Time 4   Period Weeks   Status New   PT SHORT TERM GOAL #2   Title Sliding Board Transfers w/c to level surface with maximal assist.  (Target  Date: 10/18/2014)   Time 4   Period Weeks   Status New   PT SHORT TERM GOAL #3   Title Patient performs sitting balance at edge of mat reaching 2" anteriorly & left and across midline with left UE with contact assist.  (Target Date: 10/18/2014)   Time 4   Period Weeks   Status New           PT Long Term Goals - 09/18/14 1100    PT LONG TERM GOAL #1   Title Family demonstrates updated HEP and verbalizes benefits for decreasing LE pain associated with stiffness.  (Target Date: 11/16/2014)   Time 8   Period Weeks   Status New   PT LONG TERM GOAL #2   Title Family performs car transfer with patient with sliding board safely.  (Target Date: 11/16/2014)   Time 8   Period Weeks   Status New   PT LONG TERM GOAL #3   Title Patient able to reach 5" anteriorly, left & across midline with left UE sitting at edge of mat or w/c with feet on floor with supervision.  (Target Date: 11/16/2014)   Time 8   Period Weeks   Status New   PT LONG TERM GOAL #4   Title Patient able to roll left & right with rail and boost buttocks in bed to aide with self-care with cues only.  (Target Date: 11/16/2014)   Time 8   Period Weeks   Status New            Plan - 10/09/14 1650    Clinical Impression Statement Focued on transfer training and right leg strengthening as able. Pt with trace muscle contractions with most exercises, mostly passive. Pt's family already performing stretching program at home. Will issue strengthening exericeses when appropriate.                                                              Pt will benefit from skilled therapeutic intervention in order to improve on the following deficits Abnormal gait;Decreased activity tolerance;Decreased balance;Decreased mobility;Decreased range of motion;Decreased strength;Pain   Rehab Potential Good   PT Frequency 2x / week   PT Duration 8 weeks   PT Treatment/Interventions ADLs/Self Care Home Management;DME Instruction;Functional  mobility training;Therapeutic activities;Therapeutic exercise;Balance training;Neuromuscular re-education;Patient/family education;Wheelchair mobility training   PT Next Visit Plan continue to work on seated balance, slide board transfers, right lower extremity strengthening    Consulted and Agree with Plan of Care Patient;Family member/caregiver   Family Member Consulted dtr, [name]        Problem List Patient Active Problem List   Diagnosis Date Noted  . Urinary tract  infectious disease 08/21/2014  . Primary osteoarthritis involving multiple joints 07/26/2014  . Pernicious anemia 07/26/2014  . Low TSH level 07/26/2014  . Hearing loss 07/26/2014  . Decreased vision in both eyes 07/26/2014  . Encounter for therapeutic drug monitoring 07/17/2014  . Spastic hemiparesis affecting dominant side ([place]) 05/31/2014  . Acute pulmonary embolism ([place]) 03/14/2014  . DVT of lower extremity, bilateral ([place]) 03/14/2014  . Global aphasia 03/14/2014  . 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 Dysphagia due to recent stroke 03/14/2014  . Aphasia due to stroke 03/13/2014  . Right hemiparesis ([place]) 03/13/2014  . Embolic stroke ([place])   . Cerebral infarction due to embolism of left middle cerebral artery ([place])   . Cerebral infarction due to unspecified mechanism   . Cerebral infarction due to thrombosis of right middle cerebral artery ([place])   . Stroke ([place]) 03/08/2014  . Pulmonary embolism ([place]) 03/08/2014  . CVA (cerebral infarction)   . Right sided weakness   . Back pain 08/29/2013  . Primary localized osteoarthrosis, lower leg 03/06/2013  . IBS (irritable bowel syndrome)   . 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 Multinodular goiter 01/13/2011  . Abnormal chest x-ray 12/12/2010  . HYPERTHYROIDISM 11/11/2009  . Insomnia 07/03/2009  . POSTTRAUMATIC STRESS DISORDER 08/22/2008  . OBESITY 04/21/2008  . Osteoarthritis 04/21/2008  . MIGRAINE HEADACHE 04/20/2008  . Essential hypertension 04/20/2008     Willow Ora 10/09/2014, 4:52 PM  Willow Ora, PTA, [facility] [address], [place], [place] [address] [phone] 10/09/2014, 4:53 PM

## 2014-10-09 NOTE — Therapy (Signed)
[facility] [address] [place], [place], [address] Phone: [phone]   Fax:  [phone]  Occupational Therapy Treatment  Patient Details  Name: Sandy Barrett MRN: [id number] Date of Birth: 09-18-1932 Referring Provider:  Rowe Clack, MD  Encounter Date: 10/09/2014      OT End of Session - 10/09/14 1503    Visit Number 2   Number of Visits 16   Date for OT Re-Evaluation 11/13/14   Authorization Time Period 60 days   OT Start Time 1329   OT Stop Time 1400   OT Time Calculation (min) 31 min   Activity Tolerance Patient tolerated treatment well   Behavior During Therapy [facility] for tasks assessed/performed      Past Medical History  Diagnosis Date  . HYPERTENSION   . HYPERTHYROIDISM   . OBESITY   . ARTHRITIS   . Posttraumatic stress disorder   . INSOMNIA   . MIGRAINE HEADACHE   . Hyperthyroidism     s/p I-131 ablation 03/2011 of multinod goiter  . Arthritis   . Stroke     Past Surgical History  Procedure Laterality Date  . Abdominal hysterectomy  1976  . Total hip arthroplasty  1998     right  . Tonsillectomy    . Breast surgery      biopsy  . Radiology with anesthesia Left 03/08/2014    Procedure: RADIOLOGY WITH ANESTHESIA;  Surgeon: Rob Hickman, MD;  Location: [facility];  Service: Radiology;  Laterality: Left;    There were no vitals filed for this visit.  Visit Diagnosis:  Pain of right hand  Hemiplegia affecting right side in right-dominant patient as late effect of cerebrovascular disease ([place])  Localized edema  Spasticity      Subjective Assessment - 10/09/14 1509    Patient is accompained by: Family member   Pertinent History see epic snapshot,    Patient Stated Goals Pt unable to state due to global aphasia;  dtr states to improve in any way she can especially mobility, reduce pain   Currently in Pain? Yes   Pain Score --  unable to rate   Pain Location Arm   Pain  Orientation Right   Pain Descriptors / Indicators Aching   Pain Type Chronic pain   Pain Onset More than a month ago   Aggravating Factors  malpositioning   Pain Relieving Factors repositioning   Multiple Pain Sites No        Treatment: Pt and daughter arrived late. Therapist iniiated fabrication of resting hand splint for improved hand positioning due to spasticity and pain. therapist was unable to complete splint fabrication due to time constraints.                        OT Short Term Goals - 09/18/14 1956    OT SHORT TERM GOAL #1   Title Pt and dtr will be mod I with HEP - 10/16/2014   Status New   OT SHORT TERM GOAL #2   Title Pt will tolerate UE positioning in wheelchair and bed to protect shoulder girdle, reduce edema and reduce pain   Status New   OT SHORT TERM GOAL #3   Title Pt's dtr will be I with retrograde massage to RUE   Status New   OT SHORT TERM GOAL #4   Title Pt will be able to sit EOM wth supervision only   Status New   OT SHORT  TERM GOAL #5   Title Pt and dtr will be mod I with splint wear and care   Status New           OT Long Term Goals - 09/18/14 2000    OT LONG TERM GOAL #1   Title Pt and dtr will be mod I with upgraded HEP prn - 11/13/2014   Status New   OT LONG TERM GOAL #2   Title Pt will be able to wash face with min a   Status New   OT LONG TERM GOAL #3   Title Pt will be able to eat at least 50% of her meal with utensil and min a   Status New   OT LONG TERM GOAL #4   Title Pt will require mod a to transfer to drop arm commode   Status New               Plan - 10/09/14 1454    Clinical Impression Statement Pt is progressing towards goals. She is limited by spasticity and pain. Therapist initiated fabrication of resting hand splint for improved hand positioning. Will complete next visit.   Pt will benefit from skilled therapeutic intervention in order to improve on the following deficits (Ret[place]) Decreased  activity tolerance;Decreased balance;Decreased cognition;Decreased knowledge of use of DME;Decreased mobility;Decreased range of motion;Decreased safety awareness;Decreased strength;Increased edema;Impaired UE functional use;Impaired tone;Impaired sensation;Impaired flexibility;Impaired vision/preception;Pain   Rehab Potential Fair   Clinical Impairments Affecting Rehab Potential severity of deficits   OT Frequency 2x / week   OT Duration 8 weeks   OT Treatment/Interventions Self-care/ADL training;Ultrasound;Moist Heat;Contrast Bath;DME and/or AE instruction;Neuromuscular education;Therapeutic exercise;Functional Mobility Training;Manual Therapy;Passive range of motion;Splinting;Therapeutic activities;Balance training;Patient/family education;Visual/perceptual remediation/compensation;Cognitive remediation/compensation   Plan complete fabrication of resting hand splint and issue, HEP, investigate positioning for right shoulder   Consulted and Agree with Plan of Care Patient;Family member/caregiver   Family Member Consulted dtr        Problem List Patient Active Problem List   Diagnosis Date Noted  . Urinary tract infectious disease 08/21/2014  . Primary osteoarthritis involving multiple joints 07/26/2014  . Pernicious anemia 07/26/2014  . Low TSH level 07/26/2014  . Hearing loss 07/26/2014  . Decreased vision in both eyes 07/26/2014  . Encounter for therapeutic drug monitoring 07/17/2014  . Spastic hemiparesis affecting dominant side ([place]) 05/31/2014  . Acute pulmonary embolism ([place]) 03/14/2014  . DVT of lower extremity, bilateral ([place]) 03/14/2014  . Global aphasia 03/14/2014  . 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 Dysphagia due to recent stroke 03/14/2014  . Aphasia due to stroke 03/13/2014  . Right hemiparesis ([place]) 03/13/2014  . Embolic stroke ([place])   . Cerebral infarction due to embolism of left middle cerebral artery ([place])   . Cerebral infarction due to unspecified mechanism    . Cerebral infarction due to thrombosis of right middle cerebral artery ([place])   . Stroke ([place]) 03/08/2014  . Pulmonary embolism ([place]) 03/08/2014  . CVA (cerebral infarction)   . Right sided weakness   . Back pain 08/29/2013  . Primary localized osteoarthrosis, lower leg 03/06/2013  . IBS (irritable bowel syndrome)   . 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 Multinodular goiter 01/13/2011  . Abnormal chest x-ray 12/12/2010  . HYPERTHYROIDISM 11/11/2009  . Insomnia 07/03/2009  . POSTTRAUMATIC STRESS DISORDER 08/22/2008  . OBESITY 04/21/2008  . Osteoarthritis 04/21/2008  . MIGRAINE HEADACHE 04/20/2008  . Essential hypertension 04/20/2008    RINE,KATHRYN 10/09/2014, 3:10 PM  [facility] [address] [place], [place], [address] Phone: [phone]   Fax:  [phone]

## 2014-10-10 ENCOUNTER — Encounter: Payer: Self-pay | Admitting: Family Medicine

## 2014-10-10 ENCOUNTER — Ambulatory Visit (INDEPENDENT_AMBULATORY_CARE_PROVIDER_SITE_OTHER): Payer: Medicare Other | Admitting: Family Medicine

## 2014-10-10 ENCOUNTER — Encounter: Payer: Self-pay | Admitting: Physical Therapy

## 2014-10-10 ENCOUNTER — Telehealth: Payer: Self-pay | Admitting: Internal Medicine

## 2014-10-10 ENCOUNTER — Ambulatory Visit (INDEPENDENT_AMBULATORY_CARE_PROVIDER_SITE_OTHER): Payer: Medicare Other | Admitting: *Deleted

## 2014-10-10 VITALS — BP 110/76 | HR 86 | Temp 98.7°F

## 2014-10-10 DIAGNOSIS — I63311 Cerebral infarction due to thrombosis of right middle cerebral artery: Secondary | ICD-10-CM

## 2014-10-10 DIAGNOSIS — I639 Cerebral infarction, unspecified: Secondary | ICD-10-CM

## 2014-10-10 DIAGNOSIS — Z5181 Encounter for therapeutic drug level monitoring: Secondary | ICD-10-CM

## 2014-10-10 DIAGNOSIS — J209 Acute bronchitis, unspecified: Secondary | ICD-10-CM

## 2014-10-10 LAB — POCT INR: INR: 2

## 2014-10-10 MED ORDER — BENZONATATE 200 MG PO CAPS: 200.0000 mg | ORAL_CAPSULE | Freq: Two times a day (BID) | ORAL | Status: DC | PRN

## 2014-10-10 MED ORDER — DOXYCYCLINE HYCLATE 100 MG PO CAPS: 100.0000 mg | ORAL_CAPSULE | Freq: Two times a day (BID) | ORAL | Status: AC

## 2014-10-10 NOTE — Progress Notes (Signed)
Pre visit review using our clinic review tool, if applicable. No additional management support is needed unless otherwise documented below in the visit note. 

## 2014-10-10 NOTE — Progress Notes (Signed)
   Subjective:    Patient ID: Sandy Barrett, female    DOB: 1932-05-10, 81 y.o.   MRN: [id number]  HPI Here for an intermittent cough that started 3 weeks ago. This is not productive. No fever or SOB. She feels congested but cant quite bring the mucus up. She has a hx of a bad reaction from Robitussin so they avoid giving her anything OTC.    Review of Systems  Constitutional: Negative.   HENT: Positive for congestion. Negative for postnasal drip and sinus pressure.   Eyes: Negative.   Respiratory: Positive for cough and chest tightness. Negative for shortness of breath and wheezing.   Cardiovascular: Negative.        Objective:   Physical Exam  Constitutional:  Alert, in her wheelchair   HENT:  Right Ear: External ear normal.  Left Ear: External ear normal.  Nose: Nose normal.  Mouth/Throat: Oropharynx is clear and moist.  Eyes: Conjunctivae are normal.  Neck: No thyromegaly present.  Cardiovascular: Normal rate, regular rhythm, normal heart sounds and intact distal pulses.   Pulmonary/Chest: Effort normal. No respiratory distress. She has no wheezes. She has no rales. She exhibits no tenderness.  Soft scattered rhonchi   Lymphadenopathy:    She has no cervical adenopathy.          Assessment & Plan:  Early bronchitis. Treat with Doxycycline and use Benzonatate for cough

## 2014-10-10 NOTE — Telephone Encounter (Signed)
PLEASE NOTE: All timestamps contained within this report are represented as [place] Standard Time. CONFIDENTIALTY NOTICE: This fax transmission is intended only for the addressee. It contains information that is legally privileged, confidential or otherwise protected from use or disclosure. If you are not the intended recipient, you are strictly prohibited from reviewing, disclosing, copying using or disseminating any of this information or taking any action in reliance on or regarding this information. If you have received this fax in error, please notify us immediately by telephone so that we can arrange for its return to [place]. Phone: [phone], Toll-Free: [phone], Fax: [phone] Page: 1 of 1 Call Id: [id number] [facility] [place] Patient Name: Sandy Barrett DOB: 1932/04/03 Initial Comment Caller states mother has wheezing; chest congestion and coughing Nurse Assessment Nurse: Ronnald Ramp, RN, Miranda Date/Time (Eastern Time): 10/10/2014 8:44:04 AM Confirm and document reason for call. If symptomatic, describe symptoms. ---Caller states her mother has a cough with congestion in the mornings off and on. This morning she is having more congestion and noisy breathing (described as junky mucusy sounding). Has the patient traveled out of the country within the last 30 days? ---No Does the patient have any new or worsening symptoms? ---Yes Will a triage be completed? ---Yes Related visit to physician within the last 2 weeks? ---No Does the PT have any chronic conditions? (i.e. diabetes, asthma, etc.) ---Yes List chronic conditions. ---Hx of stroke in March, GERD, Guidelines Guideline Title Affirmed Question Affirmed Notes Cough - Acute Productive Cough present > 10 days Final Disposition User See PCP When Office is Open (within 3 days) Ronnald Ramp, RN, [place] Comments No appts available at [place] office until  Friday. Caller wanted pt to be seen today, Appt scheduled with Dr. Sarajane Jews at [place] location at 2:00pm. Disagree/Comply: Comply

## 2014-10-10 NOTE — Progress Notes (Signed)
I have reviewed and agree with the plan. 

## 2014-10-10 NOTE — Therapy (Signed)
[facility] [address] [place], [place], [address] Phone: [phone]   Fax:  [phone]  Speech Language Pathology Evaluation  Patient Details  Name: Sandy Barrett MRN: [id number] Date of Birth: [birth year] Referring Provider:  Rowe Clack, MD  Encounter Date: 10/09/2014      End of Session - 10/10/14 0905    Visit Number 1   Number of Visits 25   Date for SLP Re-Evaluation 12/07/14   SLP Start Time 1449   SLP Stop Time  1531   SLP Time Calculation (min) 42 min   Activity Tolerance Other (comment)  "she tried to push away from you a couple times" per daughter      Past Medical History  Diagnosis Date  . HYPERTENSION   . HYPERTHYROIDISM   . OBESITY   . ARTHRITIS   . Posttraumatic stress disorder   . INSOMNIA   . MIGRAINE HEADACHE   . Hyperthyroidism     s/p I-131 ablation 03/2011 of multinod goiter  . Arthritis   . Stroke [facility])     Past Surgical History  Procedure Laterality Date  . Abdominal hysterectomy  1976  . Total hip arthroplasty  1998     right  . Tonsillectomy    . Breast surgery      biopsy  . Radiology with anesthesia Left 03/08/2014    Procedure: RADIOLOGY WITH ANESTHESIA;  Surgeon: Rob Hickman, MD;  Location: [facility];  Service: Radiology;  Laterality: Left;    There were no vitals filed for this visit.  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      Subjective Assessment - 10/09/14 1452    Subjective Jargon speech    Patient is accompained by: Family member  [name] - daughter            SLP Evaluation [facility] - 10/09/14 1532    Auditory Comprehension   Overall Auditory Comprehension Impaired   Yes/No Questions Impaired   Basic Biographical Questions 76-100% accurate  80%   Basic Immediate Environment Questions 50-74% accurate  50%   Overall Auditory Comprehension Comments receptive ID pictures (given name) from  f:3-  80% with max A   Expression   Primary Mode of Expression Nonverbal - gestures   Verbal Expression   Overall Verbal Expression Impaired   Automatic Speech Counting;Day of week;Singing  40% 1-10; <15% DOW, 30% HappyBday;  ALL produced with SLP   Repetition Impaired   Level of Impairment --  phoneme/ consonant vowel level (/ma/ 40%, /ga/ 0%)   Interfering Components --  Apraxia likely   Non-Verbal Means of Communication --  doesn't really use nonverbals; yes/no head movement imprecis   Other Verbal Expression Comments Gibberish/jargon speech perseverated entire exam. SLP recorded and played back - pt did not demo awareness of her incoherent speech."No" heard appropriately once. Daughter who lives iwth pt states yes/no head motions difficult to detect much of the time.                         SLP Education - 10/10/14 [phone]    Education provided Yes   Education Details daughter - activities to do at home (music and sing along) and simple receptve ID (common objects)   Person(s) Educated Patient;Child(ren)   Methods Explanation;Demonstration   Comprehension Verbalized understanding;Need further instruction  daughter          SLP Short Term Goals - 10/10/14  0906    SLP SHORT TERM GOAL #1   Title pt will receptively ID pictures or objects from f:3, given name of picture/object 30%   Time 4   Period Weeks   Status New   SLP SHORT TERM GOAL #2   Title pt will definitively ID yes/no responses via communication board or head movements 60% of the time   Time 4   Period Weeks   Status New   SLP SHORT TERM GOAL #3   Title pt will imitate consonant-vowel syllables/words with 40% success   Time 4   Period Weeks   Status New   SLP SHORT TERM GOAL #4   Title pt will perform automatic speech tasks independently with average 30% success including counting 1-20, DOW,    Time 4   Period Weeks   Status New          SLP Long Term Goals - 10/28/2014 1610    SLP LONG  TERM GOAL #1   Title pt will receptively ID pictures/objects from f:3 40% success   Time 8   Period Weeks   Status New   SLP LONG TERM GOAL #2   Title pt will perform activities at home with family as reported by family members at least 4 days per week   Time 8   Period Weeks   Status New   SLP LONG TERM GOAL #3   Title pt will ID yes/no responses with head movement or communication board with 80% of the time   Time 8   Period Weeks   Status New   SLP LONG TERM GOAL #4   Title pt will answer yes/no questions re: functional information with 90% success   Time 8   Period Weeks   Status New   SLP LONG TERM GOAL #5   Title pt will perform automatic speech tasks with average 40% success including DOW and counting 1-20   Time 8   Period Weeks   Status New   Additional Long Term Goals   Additional Long Term Goals Yes   SLP LONG TERM GOAL #6   Title pt will use multimodal communication to expressively communicate in 70% of opportunities   Time 8   Period Weeks   Status New          Plan - 10/09/14 1532    Clinical Impression Statement Pt presents with severe expressive and receptive language deficits with 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 Minimal automatic speech is intact. Skilled ST needed to maximize language skills and educate caregivers on how best to improve comunication with family and others.   Speech Therapy Frequency 3x / week  begin at x3/week and reduce to x2/week if necessary   Duration --  8 weeks   Treatment/Interventions Patient/family education;Multimodal communcation approach;Language facilitation;SLP instruction and feedback;Compensatory strategies;Cueing hierarchy;Functional tasks   Potential to Achieve Goals Fair   Potential Considerations Severity of impairments;Other (comment)  time post - CVA   Consulted and Agree with Plan of Care Family member/caregiver   Family Member Consulted daughter          G-Codes - 2014/10/28 9604    Functional Limitations Spoken  language expressive   Spoken Language Expression Current Status [phone]) At least 8 percent but less than 100 percent impaired, limited or restricted   Spoken Language Expression Goal Status [phone]) At least 65 percent but less than 100 percent impaired, limited or restricted      Problem List Patient Active Problem List   Diagnosis  Date Noted  . Urinary tract infectious disease 08/21/2014  . Primary osteoarthritis involving multiple joints 07/26/2014  . Pernicious anemia 07/26/2014  . Low TSH level 07/26/2014  . Hearing loss 07/26/2014  . Decreased vision in both eyes 07/26/2014  . Encounter for therapeutic drug monitoring 07/17/2014  . Spastic hemiparesis affecting dominant side ([place]) 05/31/2014  . Acute pulmonary embolism ([place]) 03/14/2014  . DVT of lower extremity, bilateral ([place]) 03/14/2014  . Global aphasia 03/14/2014  . 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 Dysphagia due to recent stroke 03/14/2014  . Aphasia due to stroke 03/13/2014  . Right hemiparesis ([place]) 03/13/2014  . Embolic stroke ([place])   . Cerebral infarction due to embolism of left middle cerebral artery ([place])   . Cerebral infarction due to unspecified mechanism   . Cerebral infarction due to thrombosis of right middle cerebral artery ([place])   . Stroke ([place]) 03/08/2014  . Pulmonary embolism ([place]) 03/08/2014  . CVA (cerebral infarction)   . Right sided weakness   . Back pain 08/29/2013  . Primary localized osteoarthrosis, lower leg 03/06/2013  . IBS (irritable bowel syndrome)   . 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 Multinodular goiter 01/13/2011  . Abnormal chest x-ray 12/12/2010  . HYPERTHYROIDISM 11/11/2009  . Insomnia 07/03/2009  . POSTTRAUMATIC STRESS DISORDER 08/22/2008  . OBESITY 04/21/2008  . Osteoarthritis 04/21/2008  . MIGRAINE HEADACHE 04/20/2008  . Essential hypertension 04/20/2008    [facility] , [place], [place]  10/10/2014, 9:27 AM  [facility] [address] [place], [place], [address] Phone: [phone]   Fax:  [phone]

## 2014-10-10 NOTE — Progress Notes (Signed)
Pre visit review using our clinic review tool, if applicable. No additional management support is needed unless otherwise documented below in the visit note. Pt unable to stand and weigh.   

## 2014-10-12 ENCOUNTER — Ambulatory Visit: Payer: Medicare Other

## 2014-10-12 ENCOUNTER — Ambulatory Visit: Payer: Medicare Other | Admitting: Physical Therapy

## 2014-10-12 ENCOUNTER — Encounter: Payer: Self-pay | Admitting: Physical Therapy

## 2014-10-12 ENCOUNTER — Ambulatory Visit: Payer: Medicare Other | Admitting: Occupational Therapy

## 2014-10-12 DIAGNOSIS — R2681 Unsteadiness on feet: Secondary | ICD-10-CM

## 2014-10-12 DIAGNOSIS — M79641 Pain in right hand: Secondary | ICD-10-CM

## 2014-10-12 DIAGNOSIS — R600 Localized edema: Secondary | ICD-10-CM

## 2014-10-12 DIAGNOSIS — R252 Cramp and spasm: Secondary | ICD-10-CM

## 2014-10-12 DIAGNOSIS — I69351 Hemiplegia and hemiparesis following cerebral infarction affecting right dominant side: Secondary | ICD-10-CM

## 2014-10-12 DIAGNOSIS — IMO0002 Reserved for concepts with insufficient information to code with codable children: Secondary | ICD-10-CM

## 2014-10-12 DIAGNOSIS — R258 Other abnormal involuntary movements: Secondary | ICD-10-CM | POA: Diagnosis not present

## 2014-10-12 DIAGNOSIS — M2560 Stiffness of unspecified joint, not elsewhere classified: Secondary | ICD-10-CM

## 2014-10-12 DIAGNOSIS — M623 Immobility syndrome (paraplegic): Secondary | ICD-10-CM | POA: Diagnosis not present

## 2014-10-12 DIAGNOSIS — R4701 Aphasia: Secondary | ICD-10-CM

## 2014-10-12 DIAGNOSIS — I69951 Hemiplegia and hemiparesis following unspecified cerebrovascular disease affecting right dominant side: Secondary | ICD-10-CM

## 2014-10-12 DIAGNOSIS — Z7409 Other reduced mobility: Secondary | ICD-10-CM | POA: Diagnosis not present

## 2014-10-12 DIAGNOSIS — R4189 Other symptoms and signs involving cognitive functions and awareness: Secondary | ICD-10-CM | POA: Diagnosis not present

## 2014-10-12 NOTE — Therapy (Signed)
[facility] [address] [place], [place], [address] Phone: [phone]   Fax:  [phone]  Physical Therapy Treatment  Patient Details  Name: Sandy Barrett MRN: [id number] Date of Birth: 1932/07/01 Referring Provider:  Rowe Clack, MD  Encounter Date: 10/12/2014    10/12/14 1451  PT Visits / Re-Eval  Visit Number 3  Number of Visits 17  Date for PT Re-Evaluation 11/16/14  Authorization  Authorization Type Medicare G-Code every 10th visit  PT Time Calculation  PT Start Time 1446  PT Stop Time 1530  PT Time Calculation (min) 44 min  PT - End of Session  Equipment Utilized During Treatment Gait belt  Activity Tolerance Patient tolerated treatment well;Patient limited by pain  Behavior During Therapy [facility] for tasks assessed/performed    Past Medical History  Diagnosis Date  . HYPERTENSION   . HYPERTHYROIDISM   . OBESITY   . ARTHRITIS   . Posttraumatic stress disorder   . INSOMNIA   . MIGRAINE HEADACHE   . Hyperthyroidism     s/p I-131 ablation 03/2011 of multinod goiter  . Arthritis   . Stroke [facility])     Past Surgical History  Procedure Laterality Date  . Abdominal hysterectomy  1976  . Total hip arthroplasty  1998     right  . Tonsillectomy    . Breast surgery      biopsy  . Radiology with anesthesia Left 03/08/2014    Procedure: RADIOLOGY WITH ANESTHESIA;  Surgeon: Rob Hickman, MD;  Location: [facility];  Service: Radiology;  Laterality: Left;    There were no vitals filed for this visit.  Visit Diagnosis:   Decreased transfer ability   .  Unsteadiness    Hemiparesis affecting right side as late effect of cerebrovascular accident ([place])    Stiffness due to immobility   .  Spasticity         Subjective Assessment - 10/12/14 1450    Subjective No new complaints.   Currently in Pain? No/denies   Pain Score 0-No pain     Treatment: Max assist to total assist for slide  board transfer toward pt's right side wheelchair >mat with cues for technique, anterior weight shifting, lower extremity weight bearing to assist with raising buttocks up with scooting.  Worked on seated balance at edge of bed with bil feet supported on floor: - anterior/posterior weight shifting with emphasis on anterior weight shifting, trunk rotation left to right. Pt able to participate with weight shifting, needs max assist/facilitaiton with trunk rotation. - attempted pertubation's for balance and core strengthening, pt unable to understand what to do despite multimodal cues therefore did not continue with this.  Sit<>stand: with pt pulling up on chair armrest and left leg blocked for stability to promote anterior weight shifting and leg weight bearing to assist with pt's buttocks clearing mat for scooting with slide board transfers. Performed 3 reps with 2 person mod assist, then pt with complaints of right hip/knee pain. Pt's daughter reports pt has history of arthritis.   Seated edge of mat: passive stretching of bil hamstrings, heel cords and hip adductors.  Mod to max assist for slide board transfer mat >wheelchair going toward pt' s left. Cues on technique, head placement and anterior weight shifting to assist with lifitng/moving hips.          PT Short Term Goals - 09/18/14 1100    PT SHORT TERM GOAL #1   Title Family demonstrates updated, initial HEP  correctly with minimal cues. (Target Date: 10/18/2014)   Time 4   Period Weeks   Status New   PT SHORT TERM GOAL #2   Title Sliding Board Transfers w/c to level surface with maximal assist.  (Target Date: 10/18/2014)   Time 4   Period Weeks   Status New   PT SHORT TERM GOAL #3   Title Patient performs sitting balance at edge of mat reaching 2" anteriorly & left and across midline with left UE with contact assist.  (Target Date: 10/18/2014)   Time 4   Period Weeks   Status New           PT Long Term Goals - 09/18/14  1100    PT LONG TERM GOAL #1   Title Family demonstrates updated HEP and verbalizes benefits for decreasing LE pain associated with stiffness.  (Target Date: 11/16/2014)   Time 8   Period Weeks   Status New   PT LONG TERM GOAL #2   Title Family performs car transfer with patient with sliding board safely.  (Target Date: 11/16/2014)   Time 8   Period Weeks   Status New   PT LONG TERM GOAL #3   Title Patient able to reach 5" anteriorly, left & across midline with left UE sitting at edge of mat or w/c with feet on floor with supervision.  (Target Date: 11/16/2014)   Time 8   Period Weeks   Status New   PT LONG TERM GOAL #4   Title Patient able to roll left & right with rail and boost buttocks in bed to aide with self-care with cues only.  (Target Date: 11/16/2014)   Time 8   Period Weeks   Status New        10/12/14 1451  Plan  Clinical Impression Statement Pt with increased pain with partial standing today to promote lower extremity weight bearing and anterior weight shifting, will decrease this activity and trial other ways to promote this. Pt making steady progress toward goals.  Pt will benefit from skilled therapeutic intervention in order to improve on the following deficits Abnormal gait;Decreased activity tolerance;Decreased balance;Decreased mobility;Decreased range of motion;Decreased strength;Pain  Rehab Potential Good  PT Frequency 2x / week  PT Duration 8 weeks  PT Treatment/Interventions ADLs/Self Care Home Management;DME Instruction;Functional mobility training;Therapeutic activities;Therapeutic exercise;Balance training;Neuromuscular re-education;Patient/family education;Wheelchair mobility training  PT Next Visit Plan continue to work on seated balance, slide board transfers, right lower extremity strengthening   Consulted and Agree with Plan of Care Patient;Family member/caregiver  Family Member Consulted dtr, [name]     Problem List Patient Active Problem List    Diagnosis Date Noted  . Urinary tract infectious disease 08/21/2014  . Primary osteoarthritis involving multiple joints 07/26/2014  . Pernicious anemia 07/26/2014  . Low TSH level 07/26/2014  . Hearing loss 07/26/2014  . Decreased vision in both eyes 07/26/2014  . Encounter for therapeutic drug monitoring 07/17/2014  . Spastic hemiparesis affecting dominant side ([place]) 05/31/2014  . Acute pulmonary embolism ([place]) 03/14/2014  . DVT of lower extremity, bilateral ([place]) 03/14/2014  . Global aphasia 03/14/2014  . 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 Dysphagia due to recent stroke 03/14/2014  . Aphasia due to stroke 03/13/2014  . Right hemiparesis ([place]) 03/13/2014  . Embolic stroke ([place])   . Cerebral infarction due to embolism of left middle cerebral artery ([place])   . Cerebral infarction due to unspecified mechanism   . Cerebral infarction due to thrombosis of right middle  cerebral artery ([place])   . Stroke ([place]) 03/08/2014  . Pulmonary embolism ([place]) 03/08/2014  . CVA (cerebral infarction)   . Right sided weakness   . Back pain 08/29/2013  . Primary localized osteoarthrosis, lower leg 03/06/2013  . IBS (irritable bowel syndrome)   . 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 Multinodular goiter 01/13/2011  . Abnormal chest x-ray 12/12/2010  . HYPERTHYROIDISM 11/11/2009  . Insomnia 07/03/2009  . POSTTRAUMATIC STRESS DISORDER 08/22/2008  . OBESITY 04/21/2008  . Osteoarthritis 04/21/2008  . MIGRAINE HEADACHE 04/20/2008  . Essential hypertension 04/20/2008    Willow Ora 10/12/2014, 3:01 PM  Willow Ora, PTA, [facility] [address] [place], [place] [address] [phone] 10/13/2014, 11:19 PM

## 2014-10-12 NOTE — Therapy (Signed)
[facility] [address] [place], [place], [address] Phone: [phone]   Fax:  [phone]  Occupational Therapy Treatment  Patient Details  Name: Sandy Barrett MRN: [id number] Date of Birth: Oct 07, 1932 Referring Provider:  Rowe Clack, MD  Encounter Date: 10/12/2014      OT End of Session - 10/12/14 1624    Visit Number 3   Number of Visits 16   Date for OT Re-Evaluation 11/13/14   Authorization Type medicare - pt will need G code and progress note every 10th visit   Authorization Time Period 60 days   OT Start Time 1318   OT Stop Time 1400   OT Time Calculation (min) 42 min   Activity Tolerance Patient tolerated treatment well   Behavior During Therapy [facility] for tasks assessed/performed      Past Medical History  Diagnosis Date  . HYPERTENSION   . HYPERTHYROIDISM   . OBESITY   . ARTHRITIS   . Posttraumatic stress disorder   . INSOMNIA   . MIGRAINE HEADACHE   . Hyperthyroidism     s/p I-131 ablation 03/2011 of multinod goiter  . Arthritis   . Stroke [facility])     Past Surgical History  Procedure Laterality Date  . Abdominal hysterectomy  1976  . Total hip arthroplasty  1998     right  . Tonsillectomy    . Breast surgery      biopsy  . Radiology with anesthesia Left 03/08/2014    Procedure: RADIOLOGY WITH ANESTHESIA;  Surgeon: Rob Hickman, MD;  Location: [facility];  Service: Radiology;  Laterality: Left;    There were no vitals filed for this visit.  Visit Diagnosis:  Pain of right hand  Hemiplegia affecting right side in right-dominant patient as late effect of cerebrovascular disease ([place])  Localized edema  Spasticity      Subjective Assessment - 10/12/14 1627    Subjective  Pt grimaces with right arm and hand movements   Patient Stated Goals Pt unable to state due to global aphasia;  dtr states to improve in any way she can especially mobility, reduce pain   Currently in Pain?  Yes  grimaces with right arm movements   Pain Orientation Right   Pain Type Chronic pain   Aggravating Factors  malpositioning   Pain Relieving Factors repositioning   Multiple Pain Sites No        Treatment; therapist finished fabrication/ fitting of resting hand splint. Pt demonstrated significant edema in right hand, retrograde massage performed, therapist had pt assist gently with cueing.  Therapist educated pt/ daughter in splint wear, care and prec. Pt's daughter returned demonstration of application. Pt appeared comfortable wearing splint.                      OT Education - 10/12/14 1628    Education provided Yes   Education Details splint wear, care precuations and retrograde massage   Person(s) Educated Patient;Child(ren)   Methods Explanation;Demonstration   Comprehension Verbalized understanding;Returned demonstration          OT Short Term Goals - 09/18/14 1956    OT SHORT TERM GOAL #1   Title Pt and dtr will be mod I with HEP - 10/16/2014   Status New   OT SHORT TERM GOAL #2   Title Pt will tolerate UE positioning in wheelchair and bed to protect shoulder girdle, reduce edema and reduce pain   Status New   OT  SHORT TERM GOAL #3   Title Pt's dtr will be I with retrograde massage to RUE   Status New   OT SHORT TERM GOAL #4   Title Pt will be able to sit EOM wth supervision only   Status New   OT SHORT TERM GOAL #5   Title Pt and dtr will be mod I with splint wear and care   Status New           OT Long Term Goals - 09/18/14 2000    OT LONG TERM GOAL #1   Title Pt and dtr will be mod I with upgraded HEP prn - 11/13/2014   Status New   OT LONG TERM GOAL #2   Title Pt will be able to wash face with min a   Status New   OT LONG TERM GOAL #3   Title Pt will be able to eat at least 50% of her meal with utensil and min a   Status New   OT LONG TERM GOAL #4   Title Pt will require mod a to transfer to drop arm commode   Status New                Plan - 10/12/14 1623    Clinical Impression Statement Pt's resting hand splint was issued today. Pt demonstrates increased comfort while wearing splint. Pt' daughter demonstrates understanding of application.   Pt will benefit from skilled therapeutic intervention in order to improve on the following deficits (Retired) Decreased activity tolerance;Decreased balance;Decreased cognition;Decreased knowledge of use of DME;Decreased mobility;Decreased range of motion;Decreased safety awareness;Decreased strength;Increased edema;Impaired UE functional use;Impaired tone;Impaired sensation;Impaired flexibility;Impaired vision/preception;Pain   Rehab Potential Fair   Clinical Impairments Affecting Rehab Potential severity of deficits   OT Frequency 2x / week   OT Duration 8 weeks   OT Treatment/Interventions Self-care/ADL training;Ultrasound;Moist Heat;Contrast Bath;DME and/or AE instruction;Neuromuscular education;Therapeutic exercise;Functional Mobility Training;Manual Therapy;Passive range of motion;Splinting;Therapeutic activities;Balance training;Patient/family education;Visual/perceptual remediation/compensation;Cognitive remediation/compensation   Plan initial HEP, positioning for right shoulder   Consulted and Agree with Plan of Care Patient;Family member/caregiver   Family Member Consulted dtr        Problem List Patient Active Problem List   Diagnosis Date Noted  . Urinary tract infectious disease 08/21/2014  . Primary osteoarthritis involving multiple joints 07/26/2014  . Pernicious anemia 07/26/2014  . Low TSH level 07/26/2014  . Hearing loss 07/26/2014  . Decreased vision in both eyes 07/26/2014  . Encounter for therapeutic drug monitoring 07/17/2014  . Spastic hemiparesis affecting dominant side ([place]) 05/31/2014  . Acute pulmonary embolism ([place]) 03/14/2014  . DVT of lower extremity, bilateral ([place]) 03/14/2014  . Global aphasia 03/14/2014  . 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 Dysphagia due to recent stroke 03/14/2014  . Aphasia due to stroke 03/13/2014  . Right hemiparesis ([place]) 03/13/2014  . Embolic stroke ([place])   . Cerebral infarction due to embolism of left middle cerebral artery ([place])   . Cerebral infarction due to unspecified mechanism   . Cerebral infarction due to thrombosis of right middle cerebral artery ([place])   . Stroke ([place]) 03/08/2014  . Pulmonary embolism ([place]) 03/08/2014  . CVA (cerebral infarction)   . Right sided weakness   . Back pain 08/29/2013  . Primary localized osteoarthrosis, lower leg 03/06/2013  . IBS (irritable bowel syndrome)   . 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 Multinodular goiter 01/13/2011  . Abnormal chest x-ray 12/12/2010  . HYPERTHYROIDISM 11/11/2009  . Insomnia 07/03/2009  . POSTTRAUMATIC  STRESS DISORDER 08/22/2008  . OBESITY 04/21/2008  . Osteoarthritis 04/21/2008  . MIGRAINE HEADACHE 04/20/2008  . Essential hypertension 04/20/2008    Sandy Barrett 10/12/2014, 4:28 PM Theone Murdoch, OTR/L [phone] Phone: [phone] 4:28 PM 10/12/2014 [facility] [address] [place], [place], [address] Phone: [phone]   Fax:  [phone]

## 2014-10-12 NOTE — Patient Instructions (Signed)
Your Splint This splint should initially be fitted by a healthcare practitioner.  The healthcare practitioner is responsible for providing wearing instructions and precautions to the patient, other healthcare practitioners and care provider involved in the patient's care.  This splint was custom made for you. Please read the following instructions to learn about wearing and caring for your splint.  Precautions Should your splint cause any of the following problems, remove the splint immediately and contact your therapist/physician.  Swelling  Severe Pain  Pressure Areas  Stiffness  Numbness  Do not wear your splint while operating machinery unless it has been fabricated for that purpose.  When To Wear Your Splint Where your splint according to your therapist/physician instructions. Daytime for 1 hours initially, check skin, if no problems progress to wearing splint for 2-3 hrs at a time, several times a day. If no problems gradually increase wear time each day. If she is able to tolearte for 4-6 hrs while awake without problems you may progress to nighttime wear.  Care and Cleaning of Your Splint 1. Keep your splint away from open flames. 2. Your splint will lose its shape in temperatures over 135 degrees Farenheit, ( in car windows, near radiators, ovens or in hot water).  Never make any adjustments to your splint, if the splint needs adjusting remove it and make an appointment to see your therapist. 3. Your splint, including the cushion liner may be cleaned with soap and lukewarm water.  Do not immerse in hot water over 135 degrees Farenheit. 4. Straps may be washed with soap and water, but do not moisten the self-adhesive portion.

## 2014-10-12 NOTE — Patient Instructions (Signed)
Work with you mom in the same way I did today: Tell her the name of the object 3-4 times Give her the object and hand over hand gesture with her with it if appropriate Give her a phrase/sentence with it as the last word (Keep the same phrase/sentence from time to time) Say the phrase and have her say the word with you, twice or three times

## 2014-10-12 NOTE — Therapy (Signed)
[facility] [address] [place], [place], [address] Phone: [phone]   Fax:  [phone]  Speech Language Pathology Treatment  Patient Details  Name: Sandy Barrett MRN: [id number] Date of Birth: 1932/02/13 Referring Provider:  Rowe Clack, MD  Encounter Date: 10/12/2014      End of Session - 10/12/14 1515    Visit Number 2   Number of Visits 25   Date for SLP Re-Evaluation 12/07/14   SLP Start Time 51   SLP Stop Time  1446   SLP Time Calculation (min) 44 min   Activity Tolerance Patient tolerated treatment well      Past Medical History  Diagnosis Date  . HYPERTENSION   . HYPERTHYROIDISM   . OBESITY   . ARTHRITIS   . Posttraumatic stress disorder   . INSOMNIA   . MIGRAINE HEADACHE   . Hyperthyroidism     s/p I-131 ablation 03/2011 of multinod goiter  . Arthritis   . Stroke [facility])     Past Surgical History  Procedure Laterality Date  . Abdominal hysterectomy  1976  . Total hip arthroplasty  1998     right  . Tonsillectomy    . Breast surgery      biopsy  . Radiology with anesthesia Left 03/08/2014    Procedure: RADIOLOGY WITH ANESTHESIA;  Surgeon: Rob Hickman, MD;  Location: [facility];  Service: Radiology;  Laterality: Left;    There were no vitals filed for this visit.  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   Patient is accompained by: Family member  daughter               ADULT SLP TREATMENT - 10/12/14 1419    General Information   Behavior/Cognition Alert;Pleasant mood;Cooperative   Treatment Provided   Treatment provided Cognitive-Linquistic   Pain Assessment   Pain Assessment No/denies pain   Cognitive-Linquistic Treatment   Treatment focused on Aphasia;Apraxia   Skilled Treatment Pt was 0/2 with reading comp (words)  choosing from f:3 pictures. Family names might be easier - SLP told daughter to bring family pictures next time. With naming [place] box items, pt simultaneous with SLP and max cues 70% functional speech/language expressively. Apraxic speech and perseveration consistently noted.   Assessment / Recommendations / Plan   Plan Continue with current plan of care   Progression Toward Goals   Progression toward goals Progressing toward goals          SLP Education - 10/12/14 1515    Education Details daughter - see "pt instructions"   Person(s) Educated Patient;Child(ren)   Methods Explanation;Demonstration   Comprehension Verbalized understanding          SLP Short Term Goals - 10/12/14 1516    SLP SHORT TERM GOAL #1   Title pt will receptively ID pictures or objects from f:3, given name of picture/object 30%   Time 4   Period Weeks   Status On-going   SLP SHORT TERM GOAL #2   Title pt will definitively ID yes/no responses via communication board or head movements 60% of the time   Time 4   Period Weeks   Status On-going   SLP SHORT TERM GOAL #3   Title pt will imitate consonant-vowel syllables/words with 40% success   Time 4   Period Weeks   Status On-going  SLP SHORT TERM GOAL #4   Title pt will perform automatic speech tasks independently with average 30% success including counting 1-20, DOW,    Time 4   Period Weeks   Status On-going          SLP Long Term Goals - 10/12/14 1516    SLP LONG TERM GOAL #1   Title pt will receptively ID pictures/objects from f:3 40% success   Time 8   Period Weeks   Status On-going   SLP LONG TERM GOAL #2   Title pt will perform activities at home with family as reported by family members at least 4 days per week   Time 8   Period Weeks   Status On-going   SLP LONG TERM GOAL #3   Title pt will ID yes/no responses with head movement or communication board with 80% of the time   Time 8   Period Weeks   Status On-going   SLP LONG  TERM GOAL #4   Title pt will answer yes/no questions re: functional information with 90% success   Time 8   Period Weeks   Status On-going   SLP LONG TERM GOAL #5   Title pt will perform automatic speech tasks with average 40% success including DOW and counting 1-20   Time 8   Period Weeks   Status On-going   SLP LONG TERM GOAL #6   Title pt will use multimodal communication to expressively communicate in 70% of opportunities   Time 8   Period Weeks   Status On-going          Plan - 10/12/14 1516    Clinical Impression Statement Pt presents with severe expressive and receptive language deficits with 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 Minimal automatic speech is intact. Skilled ST needed to maximize language skills and educate caregivers on improving comunication in the home.   Speech Therapy Frequency 3x / week   Duration --  8 weeks   Treatment/Interventions Patient/family education;Multimodal communcation approach;Language facilitation;SLP instruction and feedback;Compensatory strategies;Cueing hierarchy;Functional tasks   Potential to Achieve Goals Fair   Potential Considerations Severity of impairments;Other (comment)  time post CVA        Problem List Patient Active Problem List   Diagnosis Date Noted  . Urinary tract infectious disease 08/21/2014  . Primary osteoarthritis involving multiple joints 07/26/2014  . Pernicious anemia 07/26/2014  . Low TSH level 07/26/2014  . Hearing loss 07/26/2014  . Decreased vision in both eyes 07/26/2014  . Encounter for therapeutic drug monitoring 07/17/2014  . Spastic hemiparesis affecting dominant side ([place]) 05/31/2014  . Acute pulmonary embolism ([place]) 03/14/2014  . DVT of lower extremity, bilateral ([place]) 03/14/2014  . Global aphasia 03/14/2014  . 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 Dysphagia due to recent stroke 03/14/2014  . Aphasia due to stroke 03/13/2014  . Right hemiparesis ([place]) 03/13/2014  . Embolic stroke ([place])   . Cerebral  infarction due to embolism of left middle cerebral artery ([place])   . Cerebral infarction due to unspecified mechanism   . Cerebral infarction due to thrombosis of right middle cerebral artery ([place])   . Stroke ([place]) 03/08/2014  . Pulmonary embolism ([place]) 03/08/2014  . CVA (cerebral infarction)   . Right sided weakness   . Back pain 08/29/2013  . Primary localized osteoarthrosis, lower leg 03/06/2013  . IBS (irritable bowel syndrome)   . 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 Multinodular goiter 01/13/2011  . Abnormal chest x-ray 12/12/2010  . HYPERTHYROIDISM 11/11/2009  .  Insomnia 07/03/2009  . POSTTRAUMATIC STRESS DISORDER 08/22/2008  . OBESITY 04/21/2008  . Osteoarthritis 04/21/2008  . MIGRAINE HEADACHE 04/20/2008  . Essential hypertension 04/20/2008    [facility] , [place], [place]  10/12/2014, 3:17 PM  [facility] [address] [place], [place], [address] Phone: [phone]   Fax:  [phone]

## 2014-10-16 ENCOUNTER — Ambulatory Visit: Payer: Medicare Other

## 2014-10-16 ENCOUNTER — Ambulatory Visit: Payer: Medicare Other | Admitting: Occupational Therapy

## 2014-10-16 ENCOUNTER — Encounter: Payer: Self-pay | Admitting: Occupational Therapy

## 2014-10-16 ENCOUNTER — Ambulatory Visit: Payer: Medicare Other | Admitting: Physical Therapy

## 2014-10-16 DIAGNOSIS — R4189 Other symptoms and signs involving cognitive functions and awareness: Secondary | ICD-10-CM

## 2014-10-16 DIAGNOSIS — Z7409 Other reduced mobility: Secondary | ICD-10-CM | POA: Diagnosis not present

## 2014-10-16 DIAGNOSIS — R258 Other abnormal involuntary movements: Secondary | ICD-10-CM | POA: Diagnosis not present

## 2014-10-16 DIAGNOSIS — R2681 Unsteadiness on feet: Secondary | ICD-10-CM | POA: Diagnosis not present

## 2014-10-16 DIAGNOSIS — IMO0002 Reserved for concepts with insufficient information to code with codable children: Secondary | ICD-10-CM

## 2014-10-16 DIAGNOSIS — I69951 Hemiplegia and hemiparesis following unspecified cerebrovascular disease affecting right dominant side: Secondary | ICD-10-CM

## 2014-10-16 DIAGNOSIS — M79641 Pain in right hand: Secondary | ICD-10-CM

## 2014-10-16 DIAGNOSIS — R4701 Aphasia: Secondary | ICD-10-CM

## 2014-10-16 DIAGNOSIS — R252 Cramp and spasm: Secondary | ICD-10-CM

## 2014-10-16 DIAGNOSIS — M25621 Stiffness of right elbow, not elsewhere classified: Secondary | ICD-10-CM

## 2014-10-16 DIAGNOSIS — M623 Immobility syndrome (paraplegic): Secondary | ICD-10-CM | POA: Diagnosis not present

## 2014-10-16 DIAGNOSIS — R600 Localized edema: Secondary | ICD-10-CM

## 2014-10-16 DIAGNOSIS — I69351 Hemiplegia and hemiparesis following cerebral infarction affecting right dominant side: Secondary | ICD-10-CM | POA: Diagnosis not present

## 2014-10-16 NOTE — Therapy (Signed)
[facility] [address] [place], [place], [address] Phone: [phone]   Fax:  [phone]  Occupational Therapy Treatment  Patient Details  Name: Sandy Barrett MRN: [id number] Date of Birth: Mar 30, 1932 Referring Provider:  Rowe Clack, MD  Encounter Date: 10/16/2014      OT End of Session - 10/16/14 1553    Visit Number 4   Number of Visits 16   Date for OT Re-Evaluation 11/13/14   Authorization Type medicare - pt will need G code and progress note every 10th visit   Authorization Time Period 60 days   OT Start Time 1403   OT Stop Time 1445   OT Time Calculation (min) 42 min   Activity Tolerance Patient tolerated treatment well      Past Medical History  Diagnosis Date  . HYPERTENSION   . HYPERTHYROIDISM   . OBESITY   . ARTHRITIS   . Posttraumatic stress disorder   . INSOMNIA   . MIGRAINE HEADACHE   . Hyperthyroidism     s/p I-131 ablation 03/2011 of multinod goiter  . Arthritis   . Stroke [facility])     Past Surgical History  Procedure Laterality Date  . Abdominal hysterectomy  1976  . Total hip arthroplasty  1998     right  . Tonsillectomy    . Breast surgery      biopsy  . Radiology with anesthesia Left 03/08/2014    Procedure: RADIOLOGY WITH ANESTHESIA;  Surgeon: Rob Hickman, MD;  Location: [facility];  Service: Radiology;  Laterality: Left;    There were no vitals filed for this visit.  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 Yes   Pain Score --  pt unable to rate due to global aphasia   Pain Location Arm   Pain Orientation Left   Pain Descriptors / Indicators --  unable to state   Pain Type Chronic pain   Pain Onset More than a month ago   Pain Frequency Intermittent   Aggravating Factors  dtr says she can't really figure anything that makes it worse   Pain Relieving Factors dtr says she doesn't know what makes it seems better.                       OT Treatments/Exercises (OP) - 10/16/14 0001    ADLs   ADL Comments Discussed postioning in bed and in wheelchair. Dtr reports pt likes to sleep on R side however with description concern that pt is sleeping on malaligned shoulder contributing to RUE pain.  Demonstrated on dtr how to position pt on back with support and with elevation to RUE in attempt to decrease R shoulder pain.Dtr able to return demonstrate and verablize understanding and will try position before next visit. Also dicussed wheelchair positioning for RUE -pt's wheelchair has desk arms that  cannot support half lap tray that pt has.  Recommended that dtr call [facility] to swith out for full length arm rests and have [facility] try lap tray to ensure that it is supported. Dtr in agreement.   Manual Therapy   Manual Therapy Edema management   Manual therapy comments Pt with moderate edema - retrograde massage with good results - reinforced with dtr to do at least daily and to make sure she is using firm strokes. Pt also issued compression glove - pt to build up during day with 2 hours on / 2 hours off during day. Pt tolerating resting hand splint well - dtr will now have pt wear splint all night and a few hours in afternoon and then prn for swelling  Pt with significant fear and apprehension when someone touches her R arm therefore have to go slow and allow pt to build up trust with  therapist.                OT Education - 10/16/14 1551    Education provided Yes   Education Details edema massage, bed positioning, splint and glove schedule.          OT Short Term Goals - 10/16/14 1551    OT SHORT TERM GOAL #1   Title Pt and dtr will be mod I with HEP - 10/16/2014   Status On-going   OT SHORT TERM GOAL #2   Title Pt will tolerate UE positioning in wheelchair and bed to protect shoulder girdle, reduce edema and reduce pain   Status On-going   OT SHORT TERM GOAL #3   Title Pt's dtr will be I with retrograde massage to RUE   Status Achieved   OT SHORT TERM GOAL #4   Title Pt will be able to sit EOM wth supervision only   Status On-going   OT SHORT TERM GOAL #5   Title Pt and dtr will be mod I with splint wear and care   Status Achieved           OT Long Term Goals - 10/16/14 1552    OT LONG TERM GOAL #1   Title Pt and dtr will be mod I with upgraded HEP prn - 11/13/2014   Status On-going   OT LONG TERM GOAL #2   Title Pt will be able to wash face with min a   Status On-going   OT LONG TERM GOAL #3   Title Pt will be able to eat at least 50% of her meal with utensil and min a   Status On-going   OT LONG TERM GOAL #4   Title Pt will require mod a to transfer to drop arm commode   Status On-going   OT LONG TERM GOAL #5   Status On-going               Plan - 10/16/14 1552    Clinical Impression Statement Pt making slow gains toward goals. Pt wtih significant pain and fear of pain with any movement in RUE therefore requires slow and deliberate intervention with repetition.    Pt will benefit from skilled therapeutic intervention in order to improve on the following deficits (Retired) Decreased activity tolerance;Decreased balance;Decreased cognition;Decreased knowledge of use of DME;Decreased mobility;Decreased range of motion;Decreased safety awareness;Decreased strength;Increased edema;Impaired UE functional use;Impaired tone;Impaired  sensation;Impaired flexibility;Impaired vision/preception;Pain   Rehab Potential Fair   Clinical Impairments Affecting Rehab Potential severity of deficits   OT Frequency 2x / week   OT  Duration 8 weeks   OT Treatment/Interventions Self-care/ADL training;Ultrasound;Moist Heat;Contrast Bath;DME and/or AE instruction;Neuromuscular education;Therapeutic exercise;Functional Mobility Training;Manual Therapy;Passive range of motion;Splinting;Therapeutic activities;Balance training;Patient/family education;Visual/perceptual remediation/compensation;Cognitive remediation/compensation   Plan check how bed positioning went, check isotnoer glove, address HEP for PROM for RUE   Consulted and Agree with Plan of Care Patient;Family member/caregiver   Family Member Consulted dtr        Problem List Patient Active Problem List   Diagnosis Date Noted  . Urinary tract infectious disease 08/21/2014  . Primary osteoarthritis involving multiple joints 07/26/2014  . Pernicious anemia 07/26/2014  . Low TSH level 07/26/2014  . Hearing loss 07/26/2014  . Decreased vision in both eyes 07/26/2014  . Encounter for therapeutic drug monitoring 07/17/2014  . Spastic hemiparesis affecting dominant side ([place]) 05/31/2014  . Acute pulmonary embolism ([place]) 03/14/2014  . DVT of lower extremity, bilateral ([place]) 03/14/2014  . Global aphasia 03/14/2014  . 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 Dysphagia due to recent stroke 03/14/2014  . Aphasia due to stroke 03/13/2014  . Right hemiparesis ([place]) 03/13/2014  . Embolic stroke ([place])   . Cerebral infarction due to embolism of left middle cerebral artery ([place])   . Cerebral infarction due to unspecified mechanism   . Cerebral infarction due to thrombosis of right middle cerebral artery ([place])   . Stroke ([place]) 03/08/2014  . Pulmonary embolism ([place]) 03/08/2014  . CVA (cerebral infarction)   . Right sided weakness   . Back pain 08/29/2013  . Primary localized osteoarthrosis,  lower leg 03/06/2013  . IBS (irritable bowel syndrome)   . 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 Multinodular goiter 01/13/2011  . Abnormal chest x-ray 12/12/2010  . HYPERTHYROIDISM 11/11/2009  . Insomnia 07/03/2009  . POSTTRAUMATIC STRESS DISORDER 08/22/2008  . OBESITY 04/21/2008  . Osteoarthritis 04/21/2008  . MIGRAINE HEADACHE 04/20/2008  . Essential hypertension 04/20/2008    Quay Burow, OTR/L 10/16/2014, 3:56 PM  [facility] [address] [place], [place], [address] Phone: [phone]   Fax:  [phone]

## 2014-10-16 NOTE — Patient Instructions (Addendum)
Edema massage, donning and doffing of isotoner glove, schedule for wearing of glove and splint, bed positioning

## 2014-10-16 NOTE — Patient Instructions (Signed)
Use family pictures for understanding and for practicing talking. Use kitchen items for understanding and for talking too.

## 2014-10-16 NOTE — Therapy (Signed)
[facility] [address] [place], [place], [address] Phone: [phone]   Fax:  [phone]  Speech Language Pathology Treatment  Patient Details  Name: Sandy Barrett MRN: [id number] Date of Birth: 1932-01-30 Referring Provider:  Rowe Clack, MD  Encounter Date: 10/16/2014      End of Session - 10/16/14 1530    Visit Number 3   Number of Visits 25   Date for SLP Re-Evaluation 12/07/14   SLP Start Time 1448   SLP Stop Time  1530   SLP Time Calculation (min) 42 min   Activity Tolerance Patient tolerated treatment well      Past Medical History  Diagnosis Date  . HYPERTENSION   . HYPERTHYROIDISM   . OBESITY   . ARTHRITIS   . Posttraumatic stress disorder   . INSOMNIA   . MIGRAINE HEADACHE   . Hyperthyroidism     s/p I-131 ablation 03/2011 of multinod goiter  . Arthritis   . Stroke [facility])     Past Surgical History  Procedure Laterality Date  . Abdominal hysterectomy  1976  . Total hip arthroplasty  1998     right  . Tonsillectomy    . Breast surgery      biopsy  . Radiology with anesthesia Left 03/08/2014    Procedure: RADIOLOGY WITH ANESTHESIA;  Surgeon: Rob Hickman, MD;  Location: [facility];  Service: Radiology;  Laterality: Left;    There were no vitals filed for this visit.  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 Family names ID'd f:3 (using pictures) with 0%. Pt stated names with max A consistently <10% of the time.   Assessment / Recommendations / Plan   Plan Continue with current plan of care   Progression Toward Goals   Progression toward goals Progressing toward goals          SLP Education - 10/16/14 1530    Education provided Yes   Education Details home tasks   Person(s) Educated Patient;Child(ren)   Methods Explanation;Demonstration   Comprehension Verbalized understanding          SLP Short Term Goals - 10/16/14 1531    SLP SHORT TERM GOAL #1   Title pt will receptively ID pictures or objects from f:3, given name of picture/object 30%   Time 3   Period Weeks   Status On-going   SLP SHORT TERM GOAL #2   Title pt will definitively ID yes/no responses via communication board or head movements 60% of the time   Time 3   Period Weeks   Status On-going   SLP SHORT TERM GOAL #3   Title pt will imitate consonant-vowel syllables/words with 40% success   Time 3   Period Weeks   Status On-going   SLP SHORT TERM GOAL #4  Title pt will perform automatic speech tasks independently with average 30% success including counting 1-20, DOW,    Time 3   Period Weeks   Status On-going          SLP Long Term Goals - 10/16/14 1531    SLP LONG TERM GOAL #1   Title pt will receptively ID pictures/objects from f:3 40% success   Time 7   Period Weeks   Status On-going   SLP LONG TERM GOAL #2   Title pt will perform activities at home with family as reported by family members at least 4 days per week   Time 7   Period Weeks   Status On-going   SLP LONG TERM GOAL #3   Title pt will ID yes/no responses with head movement or communication board with 80% of the time   Time 7   Period Weeks   Status On-going   SLP LONG TERM GOAL #4   Title pt will answer yes/no questions re: functional information with 90% success    Time 7   Period Weeks   Status On-going   SLP LONG TERM GOAL #5   Title pt will perform automatic speech tasks with average 40% success including DOW and counting 1-20   Time 7   Period Weeks   Status On-going   SLP LONG TERM GOAL #6   Title pt will use multimodal communication to expressively communicate in 70% of opportunities   Time 7   Period Weeks   Status On-going          Plan - 10/16/14 1531    Clinical Impression Statement Pt presents with severe expressive and receptive language deficits with 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 Skilled ST needed to maximize language skills and educate caregivers on improving comunication in the home.   Speech Therapy Frequency 3x / week   Duration --  7 weeks   Treatment/Interventions Patient/family education;Multimodal communcation approach;Language facilitation;SLP instruction and feedback;Compensatory strategies;Cueing hierarchy;Functional tasks   Potential to Achieve Goals Fair   Potential Considerations Severity of impairments        Problem List Patient Active Problem List   Diagnosis Date Noted  . Urinary tract infectious disease 08/21/2014  . Primary osteoarthritis involving multiple joints 07/26/2014  . Pernicious anemia 07/26/2014  . Low TSH level 07/26/2014  . Hearing loss 07/26/2014  . Decreased vision in both eyes 07/26/2014  . Encounter for therapeutic drug monitoring 07/17/2014  . Spastic hemiparesis affecting dominant side ([place]) 05/31/2014  . Acute pulmonary embolism ([place]) 03/14/2014  . DVT of lower extremity, bilateral ([place]) 03/14/2014  . Global aphasia 03/14/2014  . 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 Dysphagia due to recent stroke 03/14/2014  . Aphasia due to stroke 03/13/2014  . Right hemiparesis ([place]) 03/13/2014  . Embolic stroke ([place])   . Cerebral infarction due to embolism of left middle cerebral artery ([place])   . Cerebral infarction due to unspecified mechanism   . Cerebral infarction due to thrombosis of  right middle cerebral artery ([place])   . Stroke ([place]) 03/08/2014  . Pulmonary embolism ([place]) 03/08/2014  . CVA (cerebral infarction)   . Right sided weakness   . Back pain 08/29/2013  . Primary localized osteoarthrosis, lower leg 03/06/2013  . IBS (irritable bowel syndrome)   . 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 Multinodular goiter 01/13/2011  . Abnormal chest x-ray 12/12/2010  . HYPERTHYROIDISM 11/11/2009  . Insomnia 07/03/2009  . POSTTRAUMATIC STRESS DISORDER 08/22/2008  . OBESITY 04/21/2008  . Osteoarthritis  04/21/2008  . MIGRAINE HEADACHE 04/20/2008  . Essential hypertension 04/20/2008    [facility] , [place], [place]  10/16/2014, 3:33 PM  [facility] [address] [place], [place], [address] Phone: [phone]   Fax:  [phone]

## 2014-10-17 ENCOUNTER — Telehealth: Payer: Self-pay | Admitting: Internal Medicine

## 2014-10-17 NOTE — Telephone Encounter (Signed)
[place] [facility] - Client [facility] Patient Name: Sandy Barrett DOB: Dec 08, 1932 Initial Comment Caller states her mother's palms and soles are purplish Nurse Assessment Nurse: Mechele Dawley, RN, Amy Date/Time (Eastern Time): 10/17/2014 11:51:44 AM Confirm and document reason for call. If symptomatic, describe symptoms. ---PALM ON THE HANDS AND SOLES OF THE FEET ARE PURPLE AND ARE WORSENING TODAY. DAUGHTER STATES THAT IS HAS BEEN GOING ON OFF AND ON FOR THE PAST COUPLE OF WEEKS. SHE IS ON BLOOD THINNERS. Has the patient traveled out of the country within the last 30 days? ---Not Applicable Does the patient have any new or worsening symptoms? ---Yes Will a triage be completed? ---Yes Related visit to physician within the last 2 weeks? ---Yes Does the PT have any chronic conditions? (i.e. diabetes, asthma, etc.) ---Yes List chronic conditions. ---ARTHRITIS, STROKE, - BLOOD THINNERS - COUMADIN Guidelines Guideline Title Affirmed Question Affirmed Notes Bruises Taking Coumadin (warfarin) or other strong blood thinner, or known bleeding disorder (e.g., thrombocytopenia) Final Disposition User See Physician within [facility], Therapist, sports, Amy Comments ATTEMPTED TO MAKE AN APPT FOR THE PATIENT FOR TOMORROW - NO APPT'S AVAILABLE. DAUGHTER IS GOING TO MAKE SURE SHE WATCHES HER MOTHERS FEET AND HANDS VERY CLOSELY. SHE WILL ELEVATE THEM. IF SHE NOTICES ANY CHANGES SHE WILL CALL BACK IMMEDIATELY AND WILL RE ASSESS. IF NOTHING CHANGES SHE WILL CALL BACK ON FRIDAY FOR AN APPT IN THE OFFICE ON SATURDAY AT [facility]. THE ONLY OTHER APPT AVAILABLE WAS NEXT WEEK. Referrals REFERRED TO PCP OFFICE REFERRED TO PCP OFFICE REFERRED TO PCP OFFICE PLEASE NOTE: All timestamps contained within this report are represented as [place] Standard Time. CONFIDENTIALTY NOTICE: This fax transmission is intended only for the addressee. It contains  information that is legally privileged, confidential or otherwise protected from use or disclosure. If you are not the intended recipient, you are strictly prohibited from reviewing, disclosing, copying using or disseminating any of this information or taking any action in reliance on or regarding this information. If you have received this fax in error, please notify us immediately by telephone so that we can arrange for its return to [place]. Phone: [phone], Toll-Free: [phone], Fax: [phone] Page: 2 of 2 Call Id: [id number] Disagree/Comply: Leta Baptist

## 2014-10-19 ENCOUNTER — Ambulatory Visit: Payer: Medicare Other

## 2014-10-19 ENCOUNTER — Ambulatory Visit: Payer: Medicare Other | Admitting: *Deleted

## 2014-10-19 ENCOUNTER — Encounter: Payer: Self-pay | Admitting: Physical Therapy

## 2014-10-19 ENCOUNTER — Ambulatory Visit: Payer: Medicare Other | Admitting: Physical Therapy

## 2014-10-19 DIAGNOSIS — I69351 Hemiplegia and hemiparesis following cerebral infarction affecting right dominant side: Secondary | ICD-10-CM | POA: Diagnosis not present

## 2014-10-19 DIAGNOSIS — Z7409 Other reduced mobility: Secondary | ICD-10-CM

## 2014-10-19 DIAGNOSIS — R4701 Aphasia: Secondary | ICD-10-CM

## 2014-10-19 DIAGNOSIS — M2560 Stiffness of unspecified joint, not elsewhere classified: Secondary | ICD-10-CM

## 2014-10-19 DIAGNOSIS — I69951 Hemiplegia and hemiparesis following unspecified cerebrovascular disease affecting right dominant side: Secondary | ICD-10-CM

## 2014-10-19 DIAGNOSIS — M79641 Pain in right hand: Secondary | ICD-10-CM

## 2014-10-19 DIAGNOSIS — R2681 Unsteadiness on feet: Secondary | ICD-10-CM | POA: Diagnosis not present

## 2014-10-19 DIAGNOSIS — M25621 Stiffness of right elbow, not elsewhere classified: Secondary | ICD-10-CM

## 2014-10-19 DIAGNOSIS — M623 Immobility syndrome (paraplegic): Secondary | ICD-10-CM | POA: Diagnosis not present

## 2014-10-19 DIAGNOSIS — R4189 Other symptoms and signs involving cognitive functions and awareness: Secondary | ICD-10-CM

## 2014-10-19 DIAGNOSIS — IMO0002 Reserved for concepts with insufficient information to code with codable children: Secondary | ICD-10-CM

## 2014-10-19 DIAGNOSIS — R258 Other abnormal involuntary movements: Secondary | ICD-10-CM | POA: Diagnosis not present

## 2014-10-19 DIAGNOSIS — R600 Localized edema: Secondary | ICD-10-CM

## 2014-10-19 NOTE — Patient Instructions (Signed)
Occupational Therapy Exercises  1. Hold hand and support elbow, lift arm straight forward. ONLY to about shoulder height or until [name] starts to grimace in pain. 2. Bring arm out to side, holding hand and supporting elbow. ONLY to about shoulder height or until [name] starts to grimace in pain. 3. Bend and straighten elbow, stop if [name] starts to grimace in pain 4. Palm up, [place] down. Only stretch a little/until [name] starts to grimace in pain 5.  Bend and straighten wrist, stop if [name] starts to grimace in pain

## 2014-10-19 NOTE — Therapy (Signed)
[facility] [address] [place], [place], [address] Phone: [phone]   Fax:  [phone]  Physical Therapy Treatment  Patient Details  Name: Sandy Barrett MRN: [id number] Date of Birth: 08/22/1932 No Data Recorded  Encounter Date: 10/19/2014   10/19/14 1242  PT Visits / Re-Eval  Visit Number 4  Number of Visits 17  Date for PT Re-Evaluation 11/16/14  Authorization  Authorization Type Medicare G-Code every 10th visit  PT Time Calculation  PT Start Time 1234  PT Stop Time 1315  PT Time Calculation (min) 41 min  PT - End of Session  Equipment Utilized During Treatment Gait belt  Activity Tolerance Patient tolerated treatment well;Patient limited by pain  Behavior During Therapy [facility] for tasks assessed/performed     Past Medical History  Diagnosis Date  . HYPERTENSION   . HYPERTHYROIDISM   . OBESITY   . ARTHRITIS   . Posttraumatic stress disorder   . INSOMNIA   . MIGRAINE HEADACHE   . Hyperthyroidism     s/p I-131 ablation 03/2011 of multinod goiter  . Arthritis   . Stroke [facility])     Past Surgical History  Procedure Laterality Date  . Abdominal hysterectomy  1976  . Total hip arthroplasty  1998     right  . Tonsillectomy    . Breast surgery      biopsy  . Radiology with anesthesia Left 03/08/2014    Procedure: RADIOLOGY WITH ANESTHESIA;  Surgeon: Rob Hickman, MD;  Location: [facility];  Service: Radiology;  Laterality: Left;    There were no vitals filed for this visit.  Visit Diagnosis:   Decreased transfer ability   .  Unsteadiness    Hemiparesis affecting right side as late effect of cerebrovascular accident ([place])   .  Stiffness due to immobility         Subjective Assessment - 10/19/14 1239    Subjective Daughter reports that the pt has been having color changes on palms and dorsum of both hands and feet (turning purplish in color). She had called the Md and they follow up  tomorrow with him regarding this. Pt is also on a antibiotic and cough suppressant (tessalon) for upper respiratory infection (started last week some time, daughter unsure of what day). No falls. Pt denies any pain at this time.                                                    Currently in Pain? No/denies   Pain Score 0-No pain     Treatment: Slide board w/c > mat with max assist and cues on technique/sequence.  Seated edge of mat: Weight shift fwd/bwd via various methods, all with emphasis on increased anterior weight shifting to assist with off loading for scooting hips.Facilitation needed to encourage weight shifting forward with pt holding onto therapist's arms, then with cues for pt to "push" therapist away and then "pull" back in.  Reaching to floor to touch cones in varied places, reaching varied directions to therapist's hand and to stool in varied places to retrieve cones. Increasing emphasis placed on reaching toward left side for increased weight shifting this way.  Partial sit ups with inverted chair/pillow behind her. 2 sets of 10 reps with assist/cues for midline position and correct ex form/technique.  PT Short Term Goals - 09/18/14 1100    PT SHORT TERM GOAL #1   Title Family demonstrates updated, initial HEP correctly with minimal cues. (Target Date: 10/18/2014)   Time 4   Period Weeks   Status New   PT SHORT TERM GOAL #2   Title Sliding Board Transfers w/c to level surface with maximal assist.  (Target Date: 10/18/2014)   Time 4   Period Weeks   Status New   PT SHORT TERM GOAL #3   Title Patient performs sitting balance at edge of mat reaching 2" anteriorly & left and across midline with left UE with contact assist.  (Target Date: 10/18/2014)   Time 4   Period Weeks   Status New           PT Long Term Goals - 09/18/14 1100    PT LONG TERM GOAL #1   Title Family demonstrates updated HEP and verbalizes benefits for decreasing LE pain associated with  stiffness.  (Target Date: 11/16/2014)   Time 8   Period Weeks   Status New   PT LONG TERM GOAL #2   Title Family performs car transfer with patient with sliding board safely.  (Target Date: 11/16/2014)   Time 8   Period Weeks   Status New   PT LONG TERM GOAL #3   Title Patient able to reach 5" anteriorly, left & across midline with left UE sitting at edge of mat or w/c with feet on floor with supervision.  (Target Date: 11/16/2014)   Time 8   Period Weeks   Status New   PT LONG TERM GOAL #4   Title Patient able to roll left & right with rail and boost buttocks in bed to aide with self-care with cues only.  (Target Date: 11/16/2014)   Time 8   Period Weeks   Status New        10/19/14 1242  Plan  Clinical Impression Statement Pt making slow, steady progress. Still with complaints of left hip/side pain with increased weight bearing on this side. Will continue per plan of care toward goals.  Pt will benefit from skilled therapeutic intervention in order to improve on the following deficits Abnormal gait;Decreased activity tolerance;Decreased balance;Decreased mobility;Decreased range of motion;Decreased strength;Pain  Rehab Potential Good  PT Frequency 2x / week  PT Duration 8 weeks  PT Treatment/Interventions ADLs/Self Care Home Management;DME Instruction;Functional mobility training;Therapeutic activities;Therapeutic exercise;Balance training;Neuromuscular re-education;Patient/family education;Wheelchair mobility training  PT Next Visit Plan continue to work on seated balance, slide board transfers, right lower extremity strengthening   Consulted and Agree with Plan of Care Patient;Family member/caregiver  Family Member Consulted dtr, [name]    Problem List Patient Active Problem List   Diagnosis Date Noted  . Urinary tract infectious disease 08/21/2014  . Primary osteoarthritis involving multiple joints 07/26/2014  . Pernicious anemia 07/26/2014  . Low TSH level 07/26/2014   . Hearing loss 07/26/2014  . Decreased vision in both eyes 07/26/2014  . Encounter for therapeutic drug monitoring 07/17/2014  . Spastic hemiparesis affecting dominant side ([place]) 05/31/2014  . Acute pulmonary embolism ([place]) 03/14/2014  . DVT of lower extremity, bilateral ([place]) 03/14/2014  . Global aphasia 03/14/2014  . 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 Dysphagia due to recent stroke 03/14/2014  . Aphasia due to stroke 03/13/2014  . Right hemiparesis ([place]) 03/13/2014  . Embolic stroke ([place])   . Cerebral infarction due to embolism of left middle cerebral artery ([place])   . Cerebral infarction due  to unspecified mechanism   . Cerebral infarction due to thrombosis of right middle cerebral artery ([place])   . Stroke ([place]) 03/08/2014  . Pulmonary embolism ([place]) 03/08/2014  . CVA (cerebral infarction)   . Right sided weakness   . Back pain 08/29/2013  . Primary localized osteoarthrosis, lower leg 03/06/2013  . IBS (irritable bowel syndrome)   . 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 Multinodular goiter 01/13/2011  . Abnormal chest x-ray 12/12/2010  . HYPERTHYROIDISM 11/11/2009  . Insomnia 07/03/2009  . POSTTRAUMATIC STRESS DISORDER 08/22/2008  . OBESITY 04/21/2008  . Osteoarthritis 04/21/2008  . MIGRAINE HEADACHE 04/20/2008  . Essential hypertension 04/20/2008    Willow Ora 10/19/2014, 1:15 PM  Willow Ora, PTA, [facility] [address], [place], [place] [address] [phone] 10/22/2014, 2:37 PM   Name: Delcie Ruppert MRN: [id number] Date of Birth: 07/22/1932

## 2014-10-19 NOTE — Therapy (Signed)
[facility] [address] [place], [place], [address] Phone: [phone]   Fax:  [phone]  Occupational Therapy Treatment  Patient Details  Name: Sandy Barrett MRN: [id number] Date of Birth: December 06, 1932 No Data Recorded  Encounter Date: 10/19/2014      OT End of Session - 10/19/14 1610    Visit Number 5   Number of Visits 16   Date for OT Re-Evaluation 11/13/14   Authorization Type medicare - pt will need G code and progress note every 10th visit   Authorization Time Period 60 days   OT Start Time 1320   OT Stop Time 1408   OT Time Calculation (min) 48 min   Activity Tolerance Patient tolerated treatment well   Behavior During Therapy [facility] for tasks assessed/performed      Past Medical History  Diagnosis Date  . HYPERTENSION   . HYPERTHYROIDISM   . OBESITY   . ARTHRITIS   . Posttraumatic stress disorder   . INSOMNIA   . MIGRAINE HEADACHE   . Hyperthyroidism     s/p I-131 ablation 03/2011 of multinod goiter  . Arthritis   . Stroke [facility])     Past Surgical History  Procedure Laterality Date  . Abdominal hysterectomy  1976  . Total hip arthroplasty  1998     right  . Tonsillectomy    . Breast surgery      biopsy  . Radiology with anesthesia Left 03/08/2014    Procedure: RADIOLOGY WITH ANESTHESIA;  Surgeon: Rob Hickman, MD;  Location: [facility];  Service: Radiology;  Laterality: Left;    There were no vitals filed for this visit.  Visit Diagnosis:  Pain of right hand  Hemiplegia affecting right side in right-dominant patient as late effect of cerebrovascular disease ([place])  Localized edema  Impaired cognition  Stiffness of joint, upper arm, right  Decreased transfer ability      Subjective Assessment - 10/19/14 1344    Subjective  Pt smiling and nodding through session, pt attempting to communicate.    Patient is accompained by: Family member  dtr   Pertinent History see epic  snapshot,    Patient Stated Goals Pt unable to state due to global aphasia;  dtr states to improve in any way she can especially mobility, reduce pain   Currently in Pain? Other (Comment)   Pain Score --  face=6/10   Pain Location Arm   Pain Orientation Left   Pain Descriptors / Indicators Discomfort;Grimacing   Pain Type Chronic pain   Pain Onset More than a month ago   Effect of Pain on Daily Activities effects ROM and ADL activities            Treatment: Patient present with daughter. Doffed splint for therapy session. Pt with some redness and small skin abrasion to inner forearm. Showed daughter this and explained to her that she needs to remove splint if seeing this. Expressed importance of splint wearing schedule (at night). Administered sleeve for arm to help decrease skin breakdown when wearing splint. Educated dtr on PROM exercises to RUE and explained these can be done in supine or when sitting EOB. Discussed bed positioning. Pt transferred edge of mat to w/c using slide board. Educated daughter on step-by-step instructions for safe and effective slide board transfers.                   OT Education - 10/19/14 1602    Education provided Yes  Education Details Splint and glove schedule, PROM exercises listed in instructions, edema mgmt   Person(s) Educated Patient;Child(ren)   Methods Explanation   Comprehension Verbalized understanding          OT Short Term Goals - 10/19/14 1610    OT SHORT TERM GOAL #1   Title Pt and dtr will be mod I with HEP - 10/16/2014   Status On-going   OT SHORT TERM GOAL #2   Title Pt will tolerate UE positioning in wheelchair and bed to protect shoulder girdle, reduce edema and reduce pain   Status On-going   OT SHORT TERM GOAL #3   Title Pt's dtr will be I with retrograde massage to RUE   Status Achieved   OT SHORT TERM GOAL #4   Title Pt will be able to sit EOM wth supervision only   Status On-going   OT SHORT TERM  GOAL #5   Title Pt and dtr will be mod I with splint wear and care   Status Achieved           OT Long Term Goals - 10/19/14 1610    OT LONG TERM GOAL #1   Title Pt and dtr will be mod I with upgraded HEP prn - 11/13/2014   Status On-going   OT LONG TERM GOAL #2   Title Pt will be able to wash face with min a   Status On-going   OT LONG TERM GOAL #3   Title Pt will be able to eat at least 50% of her meal with utensil and min a   Status On-going   OT LONG TERM GOAL #4   Title Pt will require mod a to transfer to drop arm commode   Status On-going   OT LONG TERM GOAL #5   Status On-going               Plan - 10/19/14 1608    Clinical Impression Statement Pt continues to make slow gains. Pt grimacing during ROM to RUE/hand.    Pt will benefit from skilled therapeutic intervention in order to improve on the following deficits (Retired) Decreased activity tolerance;Decreased balance;Decreased cognition;Decreased knowledge of use of DME;Decreased mobility;Decreased range of motion;Decreased safety awareness;Decreased strength;Increased edema;Impaired UE functional use;Impaired tone;Impaired sensation;Impaired flexibility;Impaired vision/preception;Pain   Rehab Potential Fair   Clinical Impairments Affecting Rehab Potential severity of deficits   OT Frequency 2x / week   OT Duration 8 weeks   OT Treatment/Interventions Self-care/ADL training;Ultrasound;Moist Heat;Contrast Bath;DME and/or AE instruction;Neuromuscular education;Therapeutic exercise;Functional Mobility Training;Manual Therapy;Passive range of motion;Splinting;Therapeutic activities;Balance training;Patient/family education;Visual/perceptual remediation/compensation;Cognitive remediation/compensation   Plan Check on how PROM for RUE exercises are going, check splint & edema glove, slide board transfers (dtr wants more education & practice with these)    Consulted and Agree with Plan of Care Patient;Family  member/caregiver   Family Member Consulted dtr        Problem List Patient Active Problem List   Diagnosis Date Noted  . Urinary tract infectious disease 08/21/2014  . Primary osteoarthritis involving multiple joints 07/26/2014  . Pernicious anemia 07/26/2014  . Low TSH level 07/26/2014  . Hearing loss 07/26/2014  . Decreased vision in both eyes 07/26/2014  . Encounter for therapeutic drug monitoring 07/17/2014  . Spastic hemiparesis affecting dominant side ([place]) 05/31/2014  . Acute pulmonary embolism ([place]) 03/14/2014  . DVT of lower extremity, bilateral ([place]) 03/14/2014  . Global aphasia 03/14/2014  . 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 Dysphagia due to recent stroke  03/14/2014  . Aphasia due to stroke 03/13/2014  . Right hemiparesis ([place]) 03/13/2014  . Embolic stroke ([place])   . Cerebral infarction due to embolism of left middle cerebral artery ([place])   . Cerebral infarction due to unspecified mechanism   . Cerebral infarction due to thrombosis of right middle cerebral artery ([place])   . Stroke ([place]) 03/08/2014  . Pulmonary embolism ([place]) 03/08/2014  . CVA (cerebral infarction)   . Right sided weakness   . Back pain 08/29/2013  . Primary localized osteoarthrosis, lower leg 03/06/2013  . IBS (irritable bowel syndrome)   . 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 Multinodular goiter 01/13/2011  . Abnormal chest x-ray 12/12/2010  . HYPERTHYROIDISM 11/11/2009  . Insomnia 07/03/2009  . POSTTRAUMATIC STRESS DISORDER 08/22/2008  . OBESITY 04/21/2008  . Osteoarthritis 04/21/2008  . MIGRAINE HEADACHE 04/20/2008  . Essential hypertension 04/20/2008    Laylia Mui , MS, OTR/L, CLT 10/19/2014, 4:12 PM  [facility] [address] [place], [place], [address] Phone: [phone]   Fax:  [phone]  Name: Alexsandra Shontz MRN: [id number] Date of Birth: 20-Aug-1932

## 2014-10-19 NOTE — Therapy (Signed)
[facility] [address] [place], [place], [address] Phone: [phone]   Fax:  [phone]  Speech Language Pathology Treatment  Patient Details  Name: Sandy Barrett MRN: [id number] Date of Birth: 1932/07/23 No Data Recorded  Encounter Date: 10/19/2014      End of Session - 10/19/14 1507    Visit Number 4   Number of Visits 25   Date for SLP Re-Evaluation 12/07/14   SLP Start Time 1405   SLP Stop Time  1446   SLP Time Calculation (min) 41 min   Activity Tolerance Patient tolerated treatment well      Past Medical History  Diagnosis Date  . HYPERTENSION   . HYPERTHYROIDISM   . OBESITY   . ARTHRITIS   . Posttraumatic stress disorder   . INSOMNIA   . MIGRAINE HEADACHE   . Hyperthyroidism     s/p I-131 ablation 03/2011 of multinod goiter  . Arthritis   . Stroke [facility])     Past Surgical History  Procedure Laterality Date  . Abdominal hysterectomy  1976  . Total hip arthroplasty  1998     right  . Tonsillectomy    . Breast surgery      biopsy  . Radiology with anesthesia Left 03/08/2014    Procedure: RADIOLOGY WITH ANESTHESIA;  Surgeon: Rob Hickman, MD;  Location: [facility];  Service: Radiology;  Laterality: Left;    There were no vitals filed for this visit.  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      Subjective Assessment - 10/19/14 1411    Subjective Questionable understanding of SLP "Pretty day outside!"   Patient is accompained by: Family member  daughter, Sandy Barrett               ADULT SLP TREATMENT - 10/19/14 1412    General Information   Behavior/Cognition Alert;Pleasant mood;Cooperative   Treatment Provided   Treatment provided Cognitive-Linquistic   Pain Assessment   Pain Assessment No/denies pain   Cognitive-Linquistic Treatment   Treatment focused on Aphasia;Apraxia   Skilled Treatment SLP targeted definitive yes/no responses  from pt due to imprecise responses from pt gesturally as well as expressively with yes/no board. Biographical yes and no questions used today, with hand over hand assist consistently for providing yes or no with yes/no board. Question pt's understanding of simple questions with visual, verbal and auditory (singing happy birthday) cues. Verbally, pt req'd max/total A from SLP for salient family names. SLP also educated pt's daughter on cueing and receptive/expressive tasks for home.   Assessment / Recommendations / Plan   Plan Continue with current plan of care   Progression Toward Goals   Progression toward goals Progressing toward goals          SLP Education - 10/19/14 1506    Education provided Yes   Education Details home tasks for receptive and expressive language, cueing   Person(s) Educated Patient;Child(ren)   Methods Explanation;Demonstration   Comprehension Verbalized understanding  daughter          SLP Short Term Goals - 10/19/14 1508    SLP SHORT TERM GOAL #1   Title pt will receptively ID pictures or objects from f:3, given name of picture/object 30%   Time 3   Period Weeks   Status On-going   SLP SHORT TERM GOAL #2   Title pt will definitively ID yes/no responses via communication board or head movements 60% of the time  Time 3   Period Weeks   Status On-going   SLP SHORT TERM GOAL #3   Title pt will imitate consonant-vowel syllables/words with 40% success   Time 3   Period Weeks   Status On-going   SLP SHORT TERM GOAL #4   Title pt will perform automatic speech tasks independently with average 30% success including counting 1-20, DOW,    Time 3   Period Weeks   Status On-going          SLP Long Term Goals - 10/19/14 1508    SLP LONG TERM GOAL #1   Title pt will receptively ID pictures/objects from f:3 40% success   Time 7   Period Weeks   Status On-going   SLP LONG TERM GOAL #2   Title pt will perform activities at home with family as reported  by family members at least 4 days per week   Time 7   Period Weeks   Status On-going   SLP LONG TERM GOAL #3   Title pt will ID yes/no responses with head movement or communication board with 80% of the time   Time 7   Period Weeks   Status On-going   SLP LONG TERM GOAL #4   Title pt will answer yes/no questions re: functional information with 90% success   Time 7   Period Weeks   Status On-going   SLP LONG TERM GOAL #5   Title pt will perform automatic speech tasks with average 40% success including DOW and counting 1-20   Time 7   Period Weeks   Status On-going   SLP LONG TERM GOAL #6   Title pt will use multimodal communication to expressively communicate in 70% of opportunities   Time 7   Period Weeks   Status On-going          Plan - 10/19/14 1507    Clinical Impression Statement Pt presents with severe expressive and receptive language deficits with 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 Skilled ST needed to maximize language skills and educate caregivers on improving comunication in the home. SLP educated pt's daughter more fully today on expressive and receptive tasks to do with pt at home.   Speech Therapy Frequency 3x / week   Duration --  7 weeks   Treatment/Interventions Patient/family education;Multimodal communcation approach;Language facilitation;SLP instruction and feedback;Compensatory strategies;Cueing hierarchy;Functional tasks   Potential to Achieve Goals Fair   Potential Considerations Severity of impairments        Problem List Patient Active Problem List   Diagnosis Date Noted  . Urinary tract infectious disease 08/21/2014  . Primary osteoarthritis involving multiple joints 07/26/2014  . Pernicious anemia 07/26/2014  . Low TSH level 07/26/2014  . Hearing loss 07/26/2014  . Decreased vision in both eyes 07/26/2014  . Encounter for therapeutic drug monitoring 07/17/2014  . Spastic hemiparesis affecting dominant side ([place]) 05/31/2014  . Acute pulmonary  embolism ([place]) 03/14/2014  . DVT of lower extremity, bilateral ([place]) 03/14/2014  . Global aphasia 03/14/2014  . 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 Dysphagia due to recent stroke 03/14/2014  . Aphasia due to stroke 03/13/2014  . Right hemiparesis ([place]) 03/13/2014  . Embolic stroke ([place])   . Cerebral infarction due to embolism of left middle cerebral artery ([place])   . Cerebral infarction due to unspecified mechanism   . Cerebral infarction due to thrombosis of right middle cerebral artery ([place])   . Stroke ([place]) 03/08/2014  . Pulmonary embolism ([place]) 03/08/2014  . CVA (  cerebral infarction)   . Right sided weakness   . Back pain 08/29/2013  . Primary localized osteoarthrosis, lower leg 03/06/2013  . IBS (irritable bowel syndrome)   . 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 Multinodular goiter 01/13/2011  . Abnormal chest x-ray 12/12/2010  . HYPERTHYROIDISM 11/11/2009  . Insomnia 07/03/2009  . POSTTRAUMATIC STRESS DISORDER 08/22/2008  . OBESITY 04/21/2008  . Osteoarthritis 04/21/2008  . MIGRAINE HEADACHE 04/20/2008  . Essential hypertension 04/20/2008    [facility] , [place], [place]  10/19/2014, 3:08 PM  [facility] [address] [place], [place], [address] Phone: [phone]   Fax:  [phone]   Name: Sandy Barrett MRN: [id number] Date of Birth: 04-28-1932

## 2014-10-20 ENCOUNTER — Ambulatory Visit: Payer: Medicare Other | Admitting: Family Medicine

## 2014-10-20 DIAGNOSIS — Z0289 Encounter for other administrative examinations: Secondary | ICD-10-CM

## 2014-10-23 ENCOUNTER — Ambulatory Visit: Payer: Medicare Other | Admitting: Occupational Therapy

## 2014-10-23 ENCOUNTER — Ambulatory Visit: Payer: Medicare Other

## 2014-10-23 ENCOUNTER — Ambulatory Visit: Payer: Medicare Other | Admitting: Physical Therapy

## 2014-10-23 ENCOUNTER — Encounter: Payer: Self-pay | Admitting: Physical Therapy

## 2014-10-23 ENCOUNTER — Telehealth: Payer: Self-pay | Admitting: Physical Therapy

## 2014-10-23 ENCOUNTER — Encounter: Payer: Self-pay | Admitting: Occupational Therapy

## 2014-10-23 DIAGNOSIS — I69951 Hemiplegia and hemiparesis following unspecified cerebrovascular disease affecting right dominant side: Secondary | ICD-10-CM

## 2014-10-23 DIAGNOSIS — Z7409 Other reduced mobility: Secondary | ICD-10-CM

## 2014-10-23 DIAGNOSIS — M25511 Pain in right shoulder: Secondary | ICD-10-CM

## 2014-10-23 DIAGNOSIS — R4189 Other symptoms and signs involving cognitive functions and awareness: Secondary | ICD-10-CM

## 2014-10-23 DIAGNOSIS — M623 Immobility syndrome (paraplegic): Secondary | ICD-10-CM | POA: Diagnosis not present

## 2014-10-23 DIAGNOSIS — R2681 Unsteadiness on feet: Secondary | ICD-10-CM | POA: Diagnosis not present

## 2014-10-23 DIAGNOSIS — R258 Other abnormal involuntary movements: Secondary | ICD-10-CM | POA: Diagnosis not present

## 2014-10-23 DIAGNOSIS — R600 Localized edema: Secondary | ICD-10-CM

## 2014-10-23 DIAGNOSIS — I69351 Hemiplegia and hemiparesis following cerebral infarction affecting right dominant side: Secondary | ICD-10-CM

## 2014-10-23 DIAGNOSIS — R414 Neurologic neglect syndrome: Secondary | ICD-10-CM

## 2014-10-23 DIAGNOSIS — R252 Cramp and spasm: Secondary | ICD-10-CM

## 2014-10-23 DIAGNOSIS — M2560 Stiffness of unspecified joint, not elsewhere classified: Secondary | ICD-10-CM

## 2014-10-23 DIAGNOSIS — M25621 Stiffness of right elbow, not elsewhere classified: Secondary | ICD-10-CM

## 2014-10-23 DIAGNOSIS — M79605 Pain in left leg: Secondary | ICD-10-CM

## 2014-10-23 DIAGNOSIS — R201 Hypoesthesia of skin: Secondary | ICD-10-CM

## 2014-10-23 NOTE — Therapy (Signed)
[facility] [address] [place], [place], [address] Phone: [phone]   Fax:  [phone]  Occupational Therapy Treatment  Patient Details  Name: Sandy Barrett MRN: [id number] Date of Birth: 1932/11/26 No Data Recorded  Encounter Date: 10/23/2014      OT End of Session - 10/23/14 1639    Visit Number 6   Number of Visits 16   Date for OT Re-Evaluation 11/13/14   Authorization Type medicare - pt will need G code and progress note every 10th visit   Authorization Time Period 60 days   OT Start Time 1320   OT Stop Time 1401   OT Time Calculation (min) 41 min      Past Medical History  Diagnosis Date  . HYPERTENSION   . HYPERTHYROIDISM   . OBESITY   . ARTHRITIS   . Posttraumatic stress disorder   . INSOMNIA   . MIGRAINE HEADACHE   . Hyperthyroidism     s/p I-131 ablation 03/2011 of multinod goiter  . Arthritis   . Stroke [facility])     Past Surgical History  Procedure Laterality Date  . Abdominal hysterectomy  1976  . Total hip arthroplasty  1998     right  . Tonsillectomy    . Breast surgery      biopsy  . Radiology with anesthesia Left 03/08/2014    Procedure: RADIOLOGY WITH ANESTHESIA;  Surgeon: Rob Hickman, MD;  Location: [facility];  Service: Radiology;  Laterality: Left;    There were no vitals filed for this visit.  Visit Diagnosis:  Hemiplegia affecting right side in right-dominant patient as late effect of cerebrovascular disease (HCC)  Localized edema  Impaired cognition  Stiffness of joint, upper arm, right  Spasticity  Pain in joint, shoulder region, right  Right-sided visual neglect  Impaired sensation      Subjective Assessment - 10/23/14 1324    Subjective  Pt smiling and pointing throughout session, no apparent distress   Patient is accompained by: Family member  dtr   Patient Stated Goals Pt unable to state due to global aphasia;  dtr states to improve in any way  she can especially mobility, reduce pain   Currently in Pain? No/denies  no distress noted                      OT Treatments/Exercises (OP) - 10/23/14 0001    ADLs   Eating Practiced self feeding  with spoon and applesauce.  With demonstration and beginning hand over hand to intiate, pt then able to self feed.  Required mod a to use napkin. Pt with signiffcant motor and ideational apraxia.  Discussed with dtr need to introduce only one food item at time with only one utensil as a crowded field causes increased confusion for pt. Dtr verbalized understanding.    Grooming Practiced washing face - pt initally attempted to eat washcloth. After multiple attempts, demonstration and hand over hand initiate pt able to wash face with just cues.    Splinting   Splinting Pt's daughter brought in splint and straps needed to be replaced.  Splint had also caused a small area on volar surface of pt's forearm - cut splint back and flared as well as provided stockingnette, Again reviewed need to monitor splint with dtr. Dtr able to verbalize understanding.                   OT Short Term Goals - 10/23/14  [place] #1   Title Pt and dtr will be mod I with HEP - 10/16/2014   Status On-going   OT SHORT TERM GOAL #2   Title Pt will tolerate UE positioning in wheelchair and bed to protect shoulder girdle, reduce edema and reduce pain   Status On-going   OT SHORT TERM GOAL #3   Title Pt's dtr will be I with retrograde massage to RUE   Status Achieved   OT SHORT TERM GOAL #4   Title Pt will be able to sit EOM wth supervision only   Status On-going   OT SHORT TERM GOAL #5   Title Pt and dtr will be mod I with splint wear and care   Status Achieved           OT Long Term Goals - 10/23/14 1348    OT LONG TERM GOAL #1   Title Pt and dtr will be mod I with upgraded HEP prn - 11/13/2014   Status On-going   OT LONG TERM GOAL #2   Title Pt will be able to wash face  with min a   Status On-going   OT LONG TERM GOAL #3   Title Pt will be able to eat at least 50% of her meal with utensil and min a   Status On-going   OT LONG TERM GOAL #4   Title Pt will require mod a to transfer to drop arm commode   Status On-going   OT LONG TERM GOAL #5   Status On-going               Plan - 10/23/14 1637    Clinical Impression Statement Pt and dtr making slow gains toward goals. Have requested MD order for full length height adjustable arm rests to support lap tray.    Pt will benefit from skilled therapeutic intervention in order to improve on the following deficits (Retired) Decreased activity tolerance;Decreased balance;Decreased cognition;Decreased knowledge of use of DME;Decreased mobility;Decreased range of motion;Decreased safety awareness;Decreased strength;Increased edema;Impaired UE functional use;Impaired tone;Impaired sensation;Impaired flexibility;Impaired vision/preception;Pain   Rehab Potential Fair   Clinical Impairments Affecting Rehab Potential severity of deficits   OT Frequency 2x / week   OT Duration 8 weeks   OT Treatment/Interventions Self-care/ADL training;Ultrasound;Moist Heat;Contrast Bath;DME and/or AE instruction;Neuromuscular education;Therapeutic exercise;Functional Mobility Training;Manual Therapy;Passive range of motion;Splinting;Therapeutic activities;Balance training;Patient/family education;Visual/perceptual remediation/compensation;Cognitive remediation/compensation   Plan continue with basic self care, sliding board transfers, sitting balance.    Consulted and Agree with Plan of Care Patient;Family member/caregiver   Family Member Consulted dtr        Problem List Patient Active Problem List   Diagnosis Date Noted  . Urinary tract infectious disease 08/21/2014  . Primary osteoarthritis involving multiple joints 07/26/2014  . Pernicious anemia 07/26/2014  . Low TSH level 07/26/2014  . Hearing loss 07/26/2014  .  Decreased vision in both eyes 07/26/2014  . Encounter for therapeutic drug monitoring 07/17/2014  . Spastic hemiparesis affecting dominant side ([place]) 05/31/2014  . Acute pulmonary embolism ([place]) 03/14/2014  . DVT of lower extremity, bilateral ([place]) 03/14/2014  . Global aphasia 03/14/2014  . 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 Dysphagia due to recent stroke 03/14/2014  . Aphasia due to stroke 03/13/2014  . Right hemiparesis ([place]) 03/13/2014  . Embolic stroke ([place])   . Cerebral infarction due to embolism of left middle cerebral artery ([place])   . Cerebral infarction due to unspecified mechanism   . Cerebral  infarction due to thrombosis of right middle cerebral artery ([place])   . Stroke ([place]) 03/08/2014  . Pulmonary embolism ([place]) 03/08/2014  . CVA (cerebral infarction)   . Right sided weakness   . Back pain 08/29/2013  . Primary localized osteoarthrosis, lower leg 03/06/2013  . IBS (irritable bowel syndrome)   . 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 Multinodular goiter 01/13/2011  . Abnormal chest x-ray 12/12/2010  . HYPERTHYROIDISM 11/11/2009  . Insomnia 07/03/2009  . POSTTRAUMATIC STRESS DISORDER 08/22/2008  . OBESITY 04/21/2008  . Osteoarthritis 04/21/2008  . MIGRAINE HEADACHE 04/20/2008  . Essential hypertension 04/20/2008    Quay Burow, OTR/L 10/23/2014, 4:41 PM  [facility] [address] [place], [place], [address] Phone: [phone]   Fax:  [phone]  Name: Sandy Barrett MRN: [id number] Date of Birth: Jun 06, 1932

## 2014-10-23 NOTE — Patient Instructions (Signed)
Occupational Therapy Exercises  1.Hold hand and support elbow, lift arm straight forward. ONLY to about shoulder height or until [name] starts to grimace in pain. 2.Bring arm out to side, holding hand and supporting elbow. ONLY to about shoulder height or until [name] starts to grimace in pain. 3.Bend and straighten elbow, stop if [name] starts to grimace in pain 4.Palm up, [place] down. Only stretch a little/until [name] starts to grimace in pain 5. Bend and straighten wrist, stop if [name] starts to grimace in pain

## 2014-10-23 NOTE — Therapy (Signed)
[facility] [address] [place], [place], [address] Phone: [phone]   Fax:  [phone]  Physical Therapy Treatment  Patient Details  Name: Sandy Barrett MRN: [id number] Date of Birth: 05-14-1932 No Data Recorded  Encounter Date: 10/23/2014      PT End of Session - 10/23/14 1230    Visit Number 5   Number of Visits 17   Date for PT Re-Evaluation 11/16/14   Authorization Type Medicare G-Code every 10th visit   PT Start Time 1233   PT Stop Time 1318   PT Time Calculation (min) 45 min   Equipment Utilized During Treatment Gait belt   Activity Tolerance Patient tolerated treatment well;Patient limited by pain   Behavior During Therapy [facility] for tasks assessed/performed      Past Medical History  Diagnosis Date  . HYPERTENSION   . HYPERTHYROIDISM   . OBESITY   . ARTHRITIS   . Posttraumatic stress disorder   . INSOMNIA   . MIGRAINE HEADACHE   . Hyperthyroidism     s/p I-131 ablation 03/2011 of multinod goiter  . Arthritis   . Stroke [facility])     Past Surgical History  Procedure Laterality Date  . Abdominal hysterectomy  1976  . Total hip arthroplasty  1998     right  . Tonsillectomy    . Breast surgery      biopsy  . Radiology with anesthesia Left 03/08/2014    Procedure: RADIOLOGY WITH ANESTHESIA;  Surgeon: Rob Hickman, MD;  Location: [facility];  Service: Radiology;  Laterality: Left;    There were no vitals filed for this visit.  Visit Diagnosis:  Decreased transfer ability  Unsteadiness  Hemiparesis affecting right side as late effect of cerebrovascular accident ([place])  Stiffness due to immobility      Subjective Assessment - 10/23/14 1230    Subjective Daughter reports pain in left leg movements. She uses Harrel Lemon to transfer if alone and Sit-Stand lift equipment if 2nd person available and patient not hurting too much.   Patient is accompained by: Family member   Currently in Pain? Other  (Comment)     Therapeutic Activities:  Sliding board transfer both to right & left with maxA. PT instructing daughter in technique for safety and facilitate patient involvement. Dtr verbalized understanding. Patient has non-verbal expressions of pain with left knee flexion and wt bearing. She moves left LE back into extension limiting involvement in transfer. Transfer was easier using a sling support under pelvis. PT instructed multiple small movements to enable patient to participate.  Reaching with left UE 2" beyond UE length to left, anterior and across midline with minA to facilitate movement, enable patient to trust wt shift. When PT removed contact and tried supervision, patient would not reach, appeared due to fear.  Seated trunk movements with tactile, manual cues to lean forward, lean back with recovery, side bend to lean onto elbow to right & left, trunk rotation to look to sides. PT instructing pt's dtr in activity.  Therapeutic Exercise: PT manual assist: sliding towel with foot knee flexion / extension, abduction/adduction for both RLE & LLE; knee extension to place foot on 8" stool anterior.                            PT Education - 10/23/14 1230    Education provided Yes   Education Details Seating trunk movements, reaching with left UE and sliding feet on  floor all with assistance to facilitate motions.   Person(s) Educated Patient;Child(ren)   Methods Explanation;Demonstration;Tactile cues;Verbal cues   Comprehension Verbalized understanding;Verbal cues required;Need further instruction;Tactile cues required          PT Short Term Goals - 10/23/14 1230    PT SHORT TERM GOAL #1   Title Family demonstrates updated, initial HEP correctly with minimal cues. (Target Date: 10/18/2014)   Baseline MET 10/23/2014 with HEP instructed to date.   Time 4   Period Weeks   Status Achieved   PT SHORT TERM GOAL #2   Title Sliding Board Transfers w/c to level  surface with maximal assist.  (Target Date: 10/18/2014)   Baseline MET 10/23/2014   Time 4   Period Weeks   Status Achieved   PT SHORT TERM GOAL #3   Title Patient performs sitting balance at edge of mat reaching 2" anteriorly & left and across midline with left UE with contact assist.  (Target Date: 10/18/2014)   Baseline MET 10/23/2014   Time 4   Period Weeks   Status Achieved           PT Long Term Goals - 10/23/14 1230    PT LONG TERM GOAL #1   Title Family demonstrates updated HEP and verbalizes benefits for decreasing LE pain associated with stiffness.  (Target Date: 11/16/2014)   Time 8   Period Weeks   Status On-going   PT LONG TERM GOAL #2   Title Family performs car transfer with patient with sliding board safely.  (Target Date: 11/16/2014)   Time 8   Period Weeks   Status On-going   PT LONG TERM GOAL #3   Title Patient able to reach 5" anteriorly, left & across midline with left UE sitting at edge of mat or w/c with feet on floor with supervision.  (Target Date: 11/16/2014)   Time 8   Period Weeks   Status On-going   PT LONG TERM GOAL #4   Title Patient able to roll left & right with rail and boost buttocks in bed to aide with self-care with cues only.  (Target Date: 11/16/2014)   Time 8   Period Weeks   Status On-going               Plan - 10/23/14 1230    Clinical Impression Statement Patient's left lower extremity is more painful limting tolerance to transfers and any activities with left knee flexed <100*. PT sent phone message to Dr. Letta Pate to request X-ray. Patient and daughter met all STGs.   Pt will benefit from skilled therapeutic intervention in order to improve on the following deficits Abnormal gait;Decreased activity tolerance;Decreased balance;Decreased mobility;Decreased range of motion;Decreased strength;Pain   Rehab Potential Good   PT Frequency 2x / week   PT Duration 8 weeks   PT Treatment/Interventions ADLs/Self Care Home  Management;DME Instruction;Functional mobility training;Therapeutic activities;Therapeutic exercise;Balance training;Neuromuscular re-education;Patient/family education;Wheelchair mobility training   PT Next Visit Plan continue to work on seated balance, slide board transfers, bilateral lower extremity strengthening    Consulted and Agree with Plan of Care Patient;Family member/caregiver   Family Member Consulted dtr, [name]        Problem List Patient Active Problem List   Diagnosis Date Noted  . Urinary tract infectious disease 08/21/2014  . Primary osteoarthritis involving multiple joints 07/26/2014  . Pernicious anemia 07/26/2014  . Low TSH level 07/26/2014  . Hearing loss 07/26/2014  . Decreased vision in both eyes 07/26/2014  . Encounter for  therapeutic drug monitoring 07/17/2014  . Spastic hemiparesis affecting dominant side ([place]) 05/31/2014  . Acute pulmonary embolism ([place]) 03/14/2014  . DVT of lower extremity, bilateral ([place]) 03/14/2014  . Global aphasia 03/14/2014  . 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 Dysphagia due to recent stroke 03/14/2014  . Aphasia due to stroke 03/13/2014  . Right hemiparesis ([place]) 03/13/2014  . Embolic stroke ([place])   . Cerebral infarction due to embolism of left middle cerebral artery ([place])   . Cerebral infarction due to unspecified mechanism   . Cerebral infarction due to thrombosis of right middle cerebral artery ([place])   . Stroke ([place]) 03/08/2014  . Pulmonary embolism ([place]) 03/08/2014  . CVA (cerebral infarction)   . Right sided weakness   . Back pain 08/29/2013  . Primary localized osteoarthrosis, lower leg 03/06/2013  . IBS (irritable bowel syndrome)   . 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 Multinodular goiter 01/13/2011  . Abnormal chest x-ray 12/12/2010  . HYPERTHYROIDISM 11/11/2009  . Insomnia 07/03/2009  . POSTTRAUMATIC STRESS DISORDER 08/22/2008  . OBESITY 04/21/2008  . Osteoarthritis 04/21/2008  . MIGRAINE HEADACHE 04/20/2008   . Essential hypertension 04/20/2008    Jamey Reas PT, DPT 10/23/2014, 2:50 PM  [facility] [address] [place], [place], [address] Phone: [phone]   Fax:  [phone]  Name: Karessa Onorato MRN: [id number] Date of Birth: Oct 28, 1932

## 2014-10-23 NOTE — Telephone Encounter (Signed)
Dr. Letta Pate, Sandy Barrett has pain with left leg movement. With her aphasia she can not tell us what exactly hurts on her left leg. Both knee & hip movements elicit grimacing and non-verbal expressions of pain. She is more apprehensive about putting weight on the left leg in transfers over the last couple of weeks. Her left leg is the less involved LE from her CVA. She has an appointment with you on Monday, October 24. I wanted to know if a X-Ray of her knee & hip would help determine an etiology for this pain.  Thank you Jamey Reas, PT, DPT

## 2014-10-24 ENCOUNTER — Encounter: Payer: Self-pay | Admitting: Internal Medicine

## 2014-10-24 ENCOUNTER — Ambulatory Visit (INDEPENDENT_AMBULATORY_CARE_PROVIDER_SITE_OTHER): Payer: Medicare Other | Admitting: Internal Medicine

## 2014-10-24 VITALS — BP 114/72 | HR 93 | Temp 98.6°F | Resp 16

## 2014-10-24 DIAGNOSIS — R238 Other skin changes: Secondary | ICD-10-CM | POA: Diagnosis not present

## 2014-10-24 DIAGNOSIS — I639 Cerebral infarction, unspecified: Secondary | ICD-10-CM | POA: Diagnosis not present

## 2014-10-24 DIAGNOSIS — L659 Nonscarring hair loss, unspecified: Secondary | ICD-10-CM | POA: Diagnosis not present

## 2014-10-24 NOTE — Patient Instructions (Signed)
A referral was ordered for vascular surgery to evaluate the changes in skin color of your feet. You should hear from this office regarding an appointment.  It is okay to supplement Feosol for the iron prescribed.  Continue to monitor the hair loss. If this gets worse after supplementing. Let us know immediately and consider a dermatology evaluation.  Continue to monitor discharge seen in the diaper. If she develops any symptoms please let us know.  Recommend follow-up every 6 months.  No change in medications recommended.

## 2014-10-24 NOTE — Progress Notes (Signed)
Subjective:    Patient ID: Sandy Barrett, female    DOB: [birth year], 81 y.o.   MRN: [id number]  HPI She is here with her daughter today. She had a stroke and has significant dysarthria. There is here because of concerns with her feet and hands turning purple.  Last week her feet turned purplish in color and this has occurred a few times in the past.  The purplish color lasted 2-3 hours and they were cold to touch.  Her feet are always cold since being on the blood thinner.  She did not have any pain.  Her daughter is unsure if this occurred when she was sitting in her wheelchair or lying down. She is not able to walk.  Hair loss:  She has some balding spots on the top of her head - the spots are localized.  She noticed these areas 3-4 weeks ago. She wondered if it could be medication related or diet related.  She has a discharge in her diaper.  It is there even without any urine.  No sign of blood or pink color.  Her daughter sees the discharge about every other day, no every change.  She has been treated for a UTI and yeast infection - there was no improvement.  She denies pain with urination and itching in the vaginal area.    She does have some skin breakdown in her buttock region and between her legs.  Her daughter is applying A&D or Zinc oxide to the areas.  They are not infected.  If there is any slight opening she does apply Neosporin. The patient denies any pain or discomfort.  Medications and allergies reviewed with patient and updated if appropriate.  Patient Active Problem List   Diagnosis Date Noted  . Urinary tract infectious disease 08/21/2014  . Primary osteoarthritis involving multiple joints 07/26/2014  . Pernicious anemia 07/26/2014  . Low TSH level 07/26/2014  . Hearing loss 07/26/2014  . Decreased vision in both eyes 07/26/2014  . Encounter for therapeutic drug monitoring 07/17/2014  . Spastic hemiparesis affecting dominant side ([place]) 05/31/2014  . Acute  pulmonary embolism ([place]) 03/14/2014  . DVT of lower extremity, bilateral ([place]) 03/14/2014  . Global aphasia 03/14/2014  . 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 Dysphagia due to recent stroke 03/14/2014  . Aphasia due to stroke 03/13/2014  . Right hemiparesis ([place]) 03/13/2014  . Embolic stroke ([place])   . Cerebral infarction due to embolism of left middle cerebral artery ([place])   . Cerebral infarction due to unspecified mechanism   . Cerebral infarction due to thrombosis of right middle cerebral artery ([place])   . Stroke ([place]) 03/08/2014  . Pulmonary embolism ([place]) 03/08/2014  . CVA (cerebral infarction)   . Right sided weakness   . Back pain 08/29/2013  . Primary localized osteoarthrosis, lower leg 03/06/2013  . IBS (irritable bowel syndrome)   . 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 Multinodular goiter 01/13/2011  . Abnormal chest x-ray 12/12/2010  . HYPERTHYROIDISM 11/11/2009  . Insomnia 07/03/2009  . POSTTRAUMATIC STRESS DISORDER 08/22/2008  . OBESITY 04/21/2008  . Osteoarthritis 04/21/2008  . MIGRAINE HEADACHE 04/20/2008  . Essential hypertension 04/20/2008    Past Medical History  Diagnosis Date  . HYPERTENSION   . HYPERTHYROIDISM   . OBESITY   . ARTHRITIS   . Posttraumatic stress disorder   . INSOMNIA   . MIGRAINE HEADACHE   . Hyperthyroidism     s/p I-131 ablation 03/2011 of multinod goiter  .  Arthritis   . Stroke [facility])     Past Surgical History  Procedure Laterality Date  . Abdominal hysterectomy  1976  . Total hip arthroplasty  1998     right  . Tonsillectomy    . Breast surgery      biopsy  . Radiology with anesthesia Left 03/08/2014    Procedure: RADIOLOGY WITH ANESTHESIA;  Surgeon: Rob Hickman, MD;  Location: [facility];  Service: Radiology;  Laterality: Left;    Social History   Social History  . Marital Status: Widowed    Spouse Name: N/A  . Number of Children: 2  . Years of Education: 14   Occupational History  . retired     Restaurant manager, fast food     Social History Main Topics  . Smoking status: Never Smoker   . Smokeless tobacco: Never Used  . Alcohol Use: No  . Drug Use: No  . Sexual Activity: No   Other Topics Concern  . None   Social History Narrative   Widowed, lived alone prior to CVA, currently residing at [facility]   Right handed   Caffeine use- occasionally drinks tea    Review of Systems  Constitutional: Negative for fever, chills, appetite change and unexpected weight change.  Respiratory: Positive for cough (sounds wet, not productive, completed antibiotic two days ago). Negative for shortness of breath and wheezing.   Cardiovascular: Positive for leg swelling (occasional, wears compression stockings). Negative for chest pain and palpitations.  Gastrointestinal: Negative for abdominal pain, diarrhea, constipation and blood in stool.  Genitourinary: Negative for dysuria.  Skin:       Some skin breakdown buttock and between legs  Neurological: Negative for dizziness, light-headedness and headaches.  Psychiatric/Behavioral: Positive for sleep disturbance. Negative for dysphoric mood. The patient is not nervous/anxious.        Objective:   Filed Vitals:   10/24/14 1333  BP: 114/72  Pulse: 93  Temp: 98.6 F (37 C)  Resp: 16   Filed Weights   There is no weight on file to calculate BMI.   Physical Exam  Constitutional: She appears well-developed and well-nourished.  Cardiovascular: Normal rate, regular rhythm and intact distal pulses.   Murmur (2/6 systolic murmur) heard. Pulmonary/Chest: Effort normal and breath sounds normal. No respiratory distress. She has no wheezes.  Abdominal: Soft. There is no tenderness.  Musculoskeletal: She exhibits edema (Wearing compression stockings, slight edema).  Neurological:  Dysarthria, significant right arm and right leg weakness, unable to ambulate  Skin: Skin is warm and dry.  Hands without discoloration and feeling warm.  Feet not examined, but  according to daughter there is no discoloration of the skin prior to coming to this appointment. Mild hearing loss, but had a couple of areas of localized hair loss-mild in nature        Assessment & Plan:   Hair loss Hair loss is somewhat mild and has only been present for a few weeks May be related to stress, iron deficiency or medication Discussed seeing a dermatologist  Decided that we will just monitor for now Continue iron supplementation If hair loss worsens they will call and I will refer her to rheumatologist  Changes in skin color purplish discoloration of feet, hands Likely vascular insufficiency She denies any pain associated with the symptoms and does not currently have any visible vascular insufficiency Pulses in all extremities Given her history and will refer to vascular surgery for further evaluation Her daughter will call if her symptoms  worsen before seeing vascular surgery  Discussed iron supplementation and she needs a smaller pill and will try a different over-the-counter Discussed wound care for her skin breakdown

## 2014-10-24 NOTE — Progress Notes (Signed)
Pre visit review using our clinic review tool, if applicable. No additional management support is needed unless otherwise documented below in the visit note. 

## 2014-10-25 ENCOUNTER — Ambulatory Visit: Payer: Medicare Other | Admitting: Occupational Therapy

## 2014-10-25 ENCOUNTER — Ambulatory Visit: Payer: Medicare Other | Admitting: Physical Therapy

## 2014-10-25 ENCOUNTER — Ambulatory Visit: Payer: Medicare Other

## 2014-10-25 ENCOUNTER — Encounter: Payer: Self-pay | Admitting: Physical Therapy

## 2014-10-25 DIAGNOSIS — R2681 Unsteadiness on feet: Secondary | ICD-10-CM | POA: Diagnosis not present

## 2014-10-25 DIAGNOSIS — R4189 Other symptoms and signs involving cognitive functions and awareness: Secondary | ICD-10-CM

## 2014-10-25 DIAGNOSIS — Z7409 Other reduced mobility: Secondary | ICD-10-CM | POA: Diagnosis not present

## 2014-10-25 DIAGNOSIS — M79641 Pain in right hand: Secondary | ICD-10-CM

## 2014-10-25 DIAGNOSIS — M2560 Stiffness of unspecified joint, not elsewhere classified: Secondary | ICD-10-CM

## 2014-10-25 DIAGNOSIS — M79605 Pain in left leg: Secondary | ICD-10-CM

## 2014-10-25 DIAGNOSIS — I69351 Hemiplegia and hemiparesis following cerebral infarction affecting right dominant side: Secondary | ICD-10-CM

## 2014-10-25 DIAGNOSIS — M623 Immobility syndrome (paraplegic): Secondary | ICD-10-CM | POA: Diagnosis not present

## 2014-10-25 DIAGNOSIS — R258 Other abnormal involuntary movements: Secondary | ICD-10-CM | POA: Diagnosis not present

## 2014-10-25 DIAGNOSIS — R414 Neurologic neglect syndrome: Secondary | ICD-10-CM

## 2014-10-25 DIAGNOSIS — R4701 Aphasia: Secondary | ICD-10-CM

## 2014-10-25 DIAGNOSIS — IMO0002 Reserved for concepts with insufficient information to code with codable children: Secondary | ICD-10-CM

## 2014-10-25 NOTE — Therapy (Signed)
[facility] [address] [place], [place], [address] Phone: [phone]   Fax:  [phone]  Speech Language Pathology Treatment  Patient Details  Name: Sandy Barrett MRN: [id number] Date of Birth: [birth year] No Data Recorded  Encounter Date: 10/25/2014      End of Session - 10/25/14 1646    Visit Number 5   Number of Visits 25   Date for SLP Re-Evaluation 12/07/14   SLP Start Time 7915   SLP Stop Time  1445   SLP Time Calculation (min) 42 min   Activity Tolerance Patient tolerated treatment well      Past Medical History  Diagnosis Date  . HYPERTENSION   . HYPERTHYROIDISM   . OBESITY   . ARTHRITIS   . Posttraumatic stress disorder   . INSOMNIA   . MIGRAINE HEADACHE   . Hyperthyroidism     s/p I-131 ablation 03/2011 of multinod goiter  . Arthritis   . Stroke [facility])     Past Surgical History  Procedure Laterality Date  . Abdominal hysterectomy  1976  . Total hip arthroplasty  1998     right  . Tonsillectomy    . Breast surgery      biopsy  . Radiology with anesthesia Left 03/08/2014    Procedure: RADIOLOGY WITH ANESTHESIA;  Surgeon: Rob Hickman, MD;  Location: [facility];  Service: Radiology;  Laterality: Left;    There were no vitals filed for this visit.  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      Subjective Assessment - 10/25/14 1453    Subjective Unintelligible gibberish for "S:"                 ADULT SLP TREATMENT - 10/25/14 1455    General Information   Behavior/Cognition Alert;Pleasant mood;Cooperative   Treatment Provided   Treatment provided Cognitive-Linquistic   Pain Assessment   Pain Assessment No/denies pain  no indications of pain in first 8 minutes of treatment   Cognitive-Linquistic Treatment   Treatment focused on Aphasia;Apraxia   Skilled Treatment Receptive ID simple familial questions targeting definitive yes/no  responses either by head nodshake or by touch on yes/no board. Pt with approx 10% success spontaneously to "Is Rose your sister?" and, "Is Hailey your sister?" and rec ID of pt's name using yes/no board or nod/shake was <10%. Pt noted with perseverative expressive speech using part of targeted names mixed with jargon speech/gibberish with simultaneous SLP/pt production.   Assessment / Recommendations / Plan   Plan Continue with current plan of care            SLP Short Term Goals - 10/25/14 1647    SLP SHORT TERM GOAL #1   Title pt will receptively ID pictures or objects from f:3, given name of picture/object 30%   Time 2   Period Weeks   Status On-going   SLP SHORT TERM GOAL #2   Title pt will definitively ID yes/no responses via communication board or head movements 40% of the time   Time 2   Period Weeks   Status Revised   SLP SHORT TERM GOAL #3   Title pt will imitate consonant-vowel syllables/words with 40% success   Time 2   Period Weeks   Status On-going   SLP SHORT TERM GOAL #4   Title pt will perform automatic speech tasks independently with average 30% success including counting 1-20, DOW,    Time 3  Period Weeks   Status On-going          SLP Long Term Goals - 10/25/14 1647    SLP LONG TERM GOAL #1   Title pt will receptively ID pictures/objects from f:3 40% success   Time 6   Period Weeks   Status On-going   SLP LONG TERM GOAL #2   Title pt will perform activities at home with family as reported by family members at least 4 days per week   Time 6   Period Weeks   Status On-going   SLP LONG TERM GOAL #3   Title pt will ID yes/no responses with head movement or communication board with 50% of the time   Time 6   Period Weeks   Status Revised   SLP LONG TERM GOAL #4   Title pt will answer yes/no questions re: functional information with 90% success   Time 6   Period Weeks   Status On-going   SLP LONG TERM GOAL #5   Title pt will perform automatic  speech tasks with average 40% success including DOW and counting 1-20   Time 6   Period Weeks   Status On-going   SLP LONG TERM GOAL #6   Title pt will use multimodal communication to expressively communicate in 70% of opportunities   Time 7   Period Weeks   Status On-going          Plan - 10/25/14 1646    Clinical Impression Statement Pt presents with severe expressive and receptive language deficits with 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 Skilled ST cont needed to maximize language skills and educate caregivers on improving comunication in the home.    Speech Therapy Frequency 3x / week   Duration --  6 weeks   Treatment/Interventions Patient/family education;Multimodal communcation approach;Language facilitation;SLP instruction and feedback;Compensatory strategies;Cueing hierarchy;Functional tasks   Potential to Achieve Goals Fair   Potential Considerations Severity of impairments        Problem List Patient Active Problem List   Diagnosis Date Noted  . Urinary tract infectious disease 08/21/2014  . Primary osteoarthritis involving multiple joints 07/26/2014  . Pernicious anemia 07/26/2014  . Low TSH level 07/26/2014  . Hearing loss 07/26/2014  . Decreased vision in both eyes 07/26/2014  . Encounter for therapeutic drug monitoring 07/17/2014  . Spastic hemiparesis affecting dominant side ([place]) 05/31/2014  . Acute pulmonary embolism ([place]) 03/14/2014  . DVT of lower extremity, bilateral ([place]) 03/14/2014  . Global aphasia 03/14/2014  . 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 Dysphagia due to recent stroke 03/14/2014  . Aphasia due to stroke 03/13/2014  . Right hemiparesis ([place]) 03/13/2014  . Embolic stroke ([place])   . Cerebral infarction due to embolism of left middle cerebral artery ([place])   . Cerebral infarction due to unspecified mechanism   . Cerebral infarction due to thrombosis of right middle cerebral artery ([place])   . Stroke ([place]) 03/08/2014  . Pulmonary embolism ([place])  03/08/2014  . CVA (cerebral infarction)   . Right sided weakness   . Back pain 08/29/2013  . Primary localized osteoarthrosis, lower leg 03/06/2013  . IBS (irritable bowel syndrome)   . 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 Multinodular goiter 01/13/2011  . Abnormal chest x-ray 12/12/2010  . HYPERTHYROIDISM 11/11/2009  . Insomnia 07/03/2009  . POSTTRAUMATIC STRESS DISORDER 08/22/2008  . OBESITY 04/21/2008  . Osteoarthritis 04/21/2008  . MIGRAINE HEADACHE 04/20/2008  . Essential hypertension 04/20/2008    [place] , [place], [place]  10/25/2014, 4:49  PM  [facility] [address] [place], [place], [address] Phone: [phone]   Fax:  [phone]   Name: Desire Fulp MRN: [id number] Date of Birth: [birth year]

## 2014-10-25 NOTE — Therapy (Signed)
[facility] [address] [place], [place], [address] Phone: [phone]   Fax:  [phone]  Physical Therapy Treatment  Patient Details  Name: Sandy Barrett MRN: [id number] Date of Birth: 01-24-1932 No Data Recorded  Encounter Date: 10/25/2014    10/25/14 1409  PT Visits / Re-Eval  Visit Number 6  Number of Visits 17  Date for PT Re-Evaluation 11/16/14  Authorization  Authorization Type Medicare G-Code every 10th visit  PT Time Calculation  PT Start Time 1402  PT Stop Time 1445  PT Time Calculation (min) 43 min  PT - End of Session  Equipment Utilized During Treatment Gait belt  Activity Tolerance Patient tolerated treatment well;Patient limited by pain  Behavior During Therapy [facility] for tasks assessed/performed    Past Medical History  Diagnosis Date  . HYPERTENSION   . HYPERTHYROIDISM   . OBESITY   . ARTHRITIS   . Posttraumatic stress disorder   . INSOMNIA   . MIGRAINE HEADACHE   . Hyperthyroidism     s/p I-131 ablation 03/2011 of multinod goiter  . Arthritis   . Stroke [facility])     Past Surgical History  Procedure Laterality Date  . Abdominal hysterectomy  1976  . Total hip arthroplasty  1998     right  . Tonsillectomy    . Breast surgery      biopsy  . Radiology with anesthesia Left 03/08/2014    Procedure: RADIOLOGY WITH ANESTHESIA;  Surgeon: Rob Hickman, MD;  Location: [facility];  Service: Radiology;  Laterality: Left;    There were no vitals filed for this visit.  Visit Diagnosis:  Decreased transfer ability   .  Unsteadiness     Hemiparesis affecting right side as late effect of cerebrovascular accident [facility])     Lower limb pain, diffuse, left   .  Stiffness due to immobility        Subjective Assessment - 10/25/14 1409    Subjective No falls or pain. No new complaints. Still having left hip and knee pain.   Currently in Pain? No/denies   Pain Score 0-No pain      Treatment: Pt received seated on edge of mat after working with OT.  Unsupported seating at edge of mat: Partial curl ups with inverted chair behind pt with pillows. 2 sets of 10 reps with cues on posture, midline position and abdominal activation.  With multiples cones: blocked practice of reaching to floor and out in all directions to retrieve cones. Progressively worked farther towards right side to emphasize right sided weight bearing. Cues needed and facilitation to reach with left UE toward farther targets.  Passive stretching of bil hamstrings and hip adductors.  Slide board transfer from mat to wheelchair with max assist and cues on technique, weight shifting and sequence.        PT Short Term Goals - 10/23/14 1230    PT SHORT TERM GOAL #1   Title Family demonstrates updated, initial HEP correctly with minimal cues. (Target Date: 10/18/2014)   Baseline MET 10/23/2014 with HEP instructed to date.   Time 4   Period Weeks   Status Achieved   PT SHORT TERM GOAL #2   Title Sliding Board Transfers w/c to level surface with maximal assist.  (Target Date: 10/18/2014)   Baseline MET 10/23/2014   Time 4   Period Weeks   Status Achieved   PT SHORT TERM GOAL #3   Title Patient performs sitting balance at edge of  mat reaching 2" anteriorly & left and across midline with left UE with contact assist.  (Target Date: 10/18/2014)   Baseline MET 10/23/2014   Time 4   Period Weeks   Status Achieved           PT Long Term Goals - 10/23/14 1230    PT LONG TERM GOAL #1   Title Family demonstrates updated HEP and verbalizes benefits for decreasing LE pain associated with stiffness.  (Target Date: 11/16/2014)   Time 8   Period Weeks   Status On-going   PT LONG TERM GOAL #2   Title Family performs car transfer with patient with sliding board safely.  (Target Date: 11/16/2014)   Time 8   Period Weeks   Status On-going   PT LONG TERM GOAL #3   Title Patient able to reach 5"  anteriorly, left & across midline with left UE sitting at edge of mat or w/c with feet on floor with supervision.  (Target Date: 11/16/2014)   Time 8   Period Weeks   Status On-going   PT LONG TERM GOAL #4   Title Patient able to roll left & right with rail and boost buttocks in bed to aide with self-care with cues only.  (Target Date: 11/16/2014)   Time 8   Period Weeks   Status On-going        10/25/14 1410  Plan  Clinical Impression Statement Pt continues to have increased left leg/hip pain with all activiity. Progressing as able towards goals.  Pt will benefit from skilled therapeutic intervention in order to improve on the following deficits Abnormal gait;Decreased activity tolerance;Decreased balance;Decreased mobility;Decreased range of motion;Decreased strength;Pain  Rehab Potential Good  PT Frequency 2x / week  PT Duration 8 weeks  PT Treatment/Interventions ADLs/Self Care Home Management;DME Instruction;Functional mobility training;Therapeutic activities;Therapeutic exercise;Balance training;Neuromuscular re-education;Patient/family education;Wheelchair mobility training  PT Next Visit Plan continue to work on seated balance, slide board transfers, bilateral lower extremity strengthening   Consulted and Agree with Plan of Care Patient;Family member/caregiver  Family Member Consulted dtr, [name]    Problem List Patient Active Problem List   Diagnosis Date Noted  . Urinary tract infectious disease 08/21/2014  . Primary osteoarthritis involving multiple joints 07/26/2014  . Pernicious anemia 07/26/2014  . Low TSH level 07/26/2014  . Hearing loss 07/26/2014  . Decreased vision in both eyes 07/26/2014  . Encounter for therapeutic drug monitoring 07/17/2014  . Spastic hemiparesis affecting dominant side ([place]) 05/31/2014  . Acute pulmonary embolism ([place]) 03/14/2014  . DVT of lower extremity, bilateral ([place]) 03/14/2014  . Global aphasia 03/14/2014  . 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 Dysphagia due to recent stroke 03/14/2014  . Aphasia due to stroke 03/13/2014  . Right hemiparesis ([place]) 03/13/2014  . Embolic stroke ([place])   . Cerebral infarction due to embolism of left middle cerebral artery ([place])   . Cerebral infarction due to unspecified mechanism   . Cerebral infarction due to thrombosis of right middle cerebral artery ([place])   . Stroke ([place]) 03/08/2014  . Pulmonary embolism ([place]) 03/08/2014  . CVA (cerebral infarction)   . Right sided weakness   . Back pain 08/29/2013  . Primary localized osteoarthrosis, lower leg 03/06/2013  . IBS (irritable bowel syndrome)   . 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 Multinodular goiter 01/13/2011  . Abnormal chest x-ray 12/12/2010  . HYPERTHYROIDISM 11/11/2009  . Insomnia 07/03/2009  . POSTTRAUMATIC STRESS DISORDER 08/22/2008  . OBESITY 04/21/2008  . Osteoarthritis 04/21/2008  .  MIGRAINE HEADACHE 04/20/2008  . Essential hypertension 04/20/2008    Willow Ora 10/25/2014, 2:11 PM  Willow Ora, PTA, [facility] [address], [place], [place] [address] [phone] 10/29/2014, 9:19 PM   Name: Sandy Barrett MRN: [id number] Date of Birth: [birth year]

## 2014-10-25 NOTE — Therapy (Signed)
[facility] [address] [place], [place], [address] Phone: [phone]   Fax:  [phone]  Occupational Therapy Treatment  Patient Details  Name: Sandy Barrett MRN: [id number] Date of Birth: 1932/08/03 No Data Recorded  Encounter Date: 10/25/2014      OT End of Session - 10/25/14 1949    Visit Number 7   Number of Visits 16   Date for OT Re-Evaluation 11/13/14   Authorization Type medicare - pt will need G code and progress note every 10th visit   Authorization Time Period 60 days   OT Start Time 1318   OT Stop Time 1400   OT Time Calculation (min) 42 min      Past Medical History  Diagnosis Date  . HYPERTENSION   . HYPERTHYROIDISM   . OBESITY   . ARTHRITIS   . Posttraumatic stress disorder   . INSOMNIA   . MIGRAINE HEADACHE   . Hyperthyroidism     s/p I-131 ablation 03/2011 of multinod goiter  . Arthritis   . Stroke [facility])     Past Surgical History  Procedure Laterality Date  . Abdominal hysterectomy  1976  . Total hip arthroplasty  1998     right  . Tonsillectomy    . Breast surgery      biopsy  . Radiology with anesthesia Left 03/08/2014    Procedure: RADIOLOGY WITH ANESTHESIA;  Surgeon: Rob Hickman, MD;  Location: [facility];  Service: Radiology;  Laterality: Left;    There were no vitals filed for this visit.  Visit Diagnosis:  Hemiparesis affecting right side as late effect of cerebrovascular accident [facility])  Right-sided visual neglect  Impaired cognition  Pain of right hand  Stiffness due to immobility      Subjective Assessment - 10/25/14 1946    Patient is accompained by: Family member   Pertinent History see epic snapshot,    Patient Stated Goals Pt unable to state due to global aphasia;  dtr states to improve in any way she can especially mobility, reduce pain   Currently in Pain? No/denies        Treatment: Transfer w/c to mat via sliding board, total A(+2 due to  pt apprehension) pt 30% Seated edge of mat , gentle P/ROM to digits and wrist in extension, edema glove appears to be fitting well. Seated edge of mat, functional reaching with LUE, to encourage weight shifts, upright posture, mod / max facilitation for leaning forward and to left as well as tasks performance. Pt's dtr present and assisting.                        OT Short Term Goals - 10/23/14 1346    OT SHORT TERM GOAL #1   Title Pt and dtr will be mod I with HEP - 10/16/2014   Status On-going   OT SHORT TERM GOAL #2   Title Pt will tolerate UE positioning in wheelchair and bed to protect shoulder girdle, reduce edema and reduce pain   Status On-going   OT SHORT TERM GOAL #3   Title Pt's dtr will be I with retrograde massage to RUE   Status Achieved   OT SHORT TERM GOAL #4   Title Pt will be able to sit EOM wth supervision only   Status On-going   OT SHORT TERM GOAL #5   Title Pt and dtr will be mod I with splint wear and care   Status  Achieved           OT Long Term Goals - 10/23/14 1348    OT LONG TERM GOAL #1   Title Pt and dtr will be mod I with upgraded HEP prn - 11/13/2014   Status On-going   OT LONG TERM GOAL #2   Title Pt will be able to wash face with min a   Status On-going   OT LONG TERM GOAL #3   Title Pt will be able to eat at least 50% of her meal with utensil and min a   Status On-going   OT LONG TERM GOAL #4   Title Pt will require mod a to transfer to drop arm commode   Status On-going   OT LONG TERM GOAL #5   Status On-going               Plan - 10/25/14 1947    Clinical Impression Statement Pt is progressing towards goals limited by poor body awareness, and cognitive deficits.   Pt will benefit from skilled therapeutic intervention in order to improve on the following deficits (Retired) Decreased activity tolerance;Decreased balance;Decreased cognition;Decreased knowledge of use of DME;Decreased mobility;Decreased range of  motion;Decreased safety awareness;Decreased strength;Increased edema;Impaired UE functional use;Impaired tone;Impaired sensation;Impaired flexibility;Impaired vision/preception;Pain   Rehab Potential Fair   Clinical Impairments Affecting Rehab Potential severity of deficits   OT Frequency 2x / week   OT Duration 8 weeks   OT Treatment/Interventions Self-care/ADL training;Ultrasound;Moist Heat;Contrast Bath;DME and/or AE instruction;Neuromuscular education;Therapeutic exercise;Functional Mobility Training;Manual Therapy;Passive range of motion;Splinting;Therapeutic activities;Balance training;Patient/family education;Visual/perceptual remediation/compensation;Cognitive remediation/compensation   Plan basic self care, sitting balance   Consulted and Agree with Plan of Care Patient;Family member/caregiver        Problem List Patient Active Problem List   Diagnosis Date Noted  . Urinary tract infectious disease 08/21/2014  . Primary osteoarthritis involving multiple joints 07/26/2014  . Pernicious anemia 07/26/2014  . Low TSH level 07/26/2014  . Hearing loss 07/26/2014  . Decreased vision in both eyes 07/26/2014  . Encounter for therapeutic drug monitoring 07/17/2014  . Spastic hemiparesis affecting dominant side ([place]) 05/31/2014  . Acute pulmonary embolism ([place]) 03/14/2014  . DVT of lower extremity, bilateral ([place]) 03/14/2014  . Global aphasia 03/14/2014  . 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 Dysphagia due to recent stroke 03/14/2014  . Aphasia due to stroke 03/13/2014  . Right hemiparesis ([place]) 03/13/2014  . Embolic stroke ([place])   . Cerebral infarction due to embolism of left middle cerebral artery ([place])   . Cerebral infarction due to unspecified mechanism   . Cerebral infarction due to thrombosis of right middle cerebral artery ([place])   . Stroke ([place]) 03/08/2014  . Pulmonary embolism ([place]) 03/08/2014  . CVA (cerebral infarction)   . Right sided weakness   . Back pain 08/29/2013   . Primary localized osteoarthrosis, lower leg 03/06/2013  . IBS (irritable bowel syndrome)   . 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 Multinodular goiter 01/13/2011  . Abnormal chest x-ray 12/12/2010  . HYPERTHYROIDISM 11/11/2009  . Insomnia 07/03/2009  . POSTTRAUMATIC STRESS DISORDER 08/22/2008  . OBESITY 04/21/2008  . Osteoarthritis 04/21/2008  . MIGRAINE HEADACHE 04/20/2008  . Essential hypertension 04/20/2008    RINE,KATHRYN 10/25/2014, 7:54 PM Theone Murdoch, OTR/L [phone] Phone: [phone] 7:54 PM 10/25/2014 [facility] [address] [place], [place], [address] Phone: [phone]   Fax:  [phone]  Name: Abraham Margulies MRN: [id number] Date of Birth: [birth year]

## 2014-10-29 ENCOUNTER — Encounter: Payer: Self-pay | Admitting: Physical Medicine & Rehabilitation

## 2014-10-29 ENCOUNTER — Encounter: Payer: Medicare Other | Attending: Physical Medicine & Rehabilitation

## 2014-10-29 ENCOUNTER — Ambulatory Visit (HOSPITAL_BASED_OUTPATIENT_CLINIC_OR_DEPARTMENT_OTHER): Payer: Medicare Other | Admitting: Physical Medicine & Rehabilitation

## 2014-10-29 VITALS — BP 127/64 | HR 82 | Resp 14

## 2014-10-29 DIAGNOSIS — M7501 Adhesive capsulitis of right shoulder: Secondary | ICD-10-CM | POA: Diagnosis not present

## 2014-10-29 DIAGNOSIS — I69320 Aphasia following cerebral infarction: Secondary | ICD-10-CM | POA: Insufficient documentation

## 2014-10-29 DIAGNOSIS — I639 Cerebral infarction, unspecified: Secondary | ICD-10-CM | POA: Diagnosis not present

## 2014-10-29 DIAGNOSIS — M6289 Other specified disorders of muscle: Secondary | ICD-10-CM | POA: Diagnosis not present

## 2014-10-29 DIAGNOSIS — M1712 Unilateral primary osteoarthritis, left knee: Secondary | ICD-10-CM

## 2014-10-29 DIAGNOSIS — G8191 Hemiplegia, unspecified affecting right dominant side: Secondary | ICD-10-CM

## 2014-10-29 DIAGNOSIS — G8190 Hemiplegia, unspecified affecting unspecified side: Secondary | ICD-10-CM | POA: Insufficient documentation

## 2014-10-29 DIAGNOSIS — M7541 Impingement syndrome of right shoulder: Secondary | ICD-10-CM | POA: Insufficient documentation

## 2014-10-29 NOTE — Progress Notes (Signed)
Subjective:    Patient ID: Sandy Barrett, female    DOB: 26-Dec-1932, 81 y.o.   MRN: [id number]  HPI 81 year old female with history of septal defect as well as deep venous thrombosis who suffered a large left middle cerebral artery infarct in March 2016. She completed inpatient rehabilitation at [facility] in March and then she went to a skilled nursing facility. She is now at home living with assistance from family members. She is going through outpatient therapy at the current time. She's had knee pain on the left side limiting her therapy participation. Her PT was concerned and requested an x-ray as she could not discern whether this was a knee or hip problem. There has been no history of recent fall. Review of prior history indicates that she has been treated on several occasions by sports medicine M.D. For bilateral knee injections for severe lateral compartment osteoarthritis on the right side and moderately severe on the left side.  Patient also continues to have right shoulder pain no falls. Daughter is concerned about the deformity which is an inferior subluxation.  Continues to have severe global aphasia difficulty with receptive and expressive language  Pain Inventory Average Pain 4 Pain Right Now 4 My pain is intermittent, sharp, stabbing and aching  In the last 24 hours, has pain interfered with the following? General activity 7 Relation with others 7 Enjoyment of life 7 What TIME of day is your pain at its worst? morning Sleep (in general) Fair  Pain is worse with: bending and standing Pain improves with: therapy/exercise, medication and injections Relief from Meds: 6  Mobility how many minutes can you walk? 0 ability to climb steps?  no do you drive?  no use a wheelchair needs help with transfers Do you have any goals in this area?  yes  Function retired I need assistance with the following:  dressing, bathing, toileting, meal prep, household duties and  shopping  Neuro/Psych bladder control problems bowel control problems weakness trouble walking  Prior Studies Any changes since last visit?  no  Physicians involved in your care Any changes since last visit?  no   Family History  Problem Relation Age of Onset  . Asthma Mother   . Asthma Father   . Prostate cancer Father   . Stroke Brother    Social History   Social History  . Marital Status: Widowed    Spouse Name: N/A  . Number of Children: 2  . Years of Education: 14   Occupational History  . retired     Restaurant manager, fast food   Social History Main Topics  . Smoking status: Never Smoker   . Smokeless tobacco: Never Used  . Alcohol Use: No  . Drug Use: No  . Sexual Activity: No   Other Topics Concern  . None   Social History Narrative   Widowed, lived alone prior to CVA, currently residing at [facility]   Right handed   Caffeine use- occasionally drinks tea   Past Surgical History  Procedure Laterality Date  . Abdominal hysterectomy  1976  . Total hip arthroplasty  1998     right  . Tonsillectomy    . Breast surgery      biopsy  . Radiology with anesthesia Left 03/08/2014    Procedure: RADIOLOGY WITH ANESTHESIA;  Surgeon: Rob Hickman, MD;  Location: [facility];  Service: Radiology;  Laterality: Left;   Past Medical History  Diagnosis Date  . HYPERTENSION   . HYPERTHYROIDISM   .  OBESITY   . ARTHRITIS   . Posttraumatic stress disorder   . INSOMNIA   . MIGRAINE HEADACHE   . Hyperthyroidism     s/p I-131 ablation 03/2011 of multinod goiter  . Arthritis   . Stroke ([place])    BP 127/64 mmHg  Pulse 82  Resp 14  SpO2 92%  Opioid Risk Score:   Fall Risk Score:  `1  Depression screen PHQ 2/9  Depression screen [facility] 2/9 09/17/2014 08/21/2014 08/20/2014 08/07/2013 12/24/2011  Decreased Interest 0 0 0 0 0  Down, Depressed, Hopeless 0 0 1 1 0  PHQ - 2 Score 0 0 1 1 0     Review of Systems  Gastrointestinal:       Bowel incontinence    Genitourinary:       Urinary incontinence  Musculoskeletal: Positive for gait problem.  Skin: Positive for rash.  Neurological: Positive for weakness.       Objective:   Physical Exam  Left knee with valgus deformity. Pain with range of motion.Patient gives facial expressions when knee is flexed beyond 90. There is valgus deformity at both knees. No pain with hip internal/external rotation on the left side. Right side no pain with range of motion at the hip or knee. 1 fingerbreadth subluxation of the right shoulder. Decreased external rotation of the shoulder. Pain with abduction at 80. Motor strength is 0/5 in the right deltoid, biceps, triceps, grip, hip flexor, knee extensor, ankle dorsi flexors and plantar flexor Motor apraxias present limiting ability to stay with manual muscle testing. Tone: Right upper extremity Ashworth grade 2 at the [employer] Ashworth grade 2 at the finger flexors both DIP and PIP      Assessment & Plan:  1. Right spastic hemiplegia secondary to left MCA infarct. She has had little motor improvement thus far. She also continues to have severe Aphasia both receptive and expressive. I would recommend continue PTOT and speech as an outpatient.  2. Bilateral knee osteoarthritis lateral compartment primarily. She has pain mainly on the left side. She has had knee injections bilaterally with last injection performed in February 2016. Pain is limiting her therapy participation. We will inject today.I do not think she needs any x-rays I have reviewed her prior films with the patient as well as the daughter.  Knee injection   Indication:Left Knee pain not relieved by medication management and other conservative care.  Informed consent was obtained after describing risks and benefits of the procedure with the patient, this includes bleeding, bruising, infection and medication side effects. The patient wishes to proceed and has given written consent. The patient was placed  in a recumbent position. The medial aspect of the knee was marked and prepped with Betadine and alcohol. It was then entered with a 25-gauge 1-1/2 inch needle  was inserted into the knee joint. After negative draw back for blood, a solution containing one ML of 6mg  per mL betamethasone and 3 mL of 1% lidocaine were injected. The patient tolerated the procedure well. Post procedure instructions were given.  3. Left shoulder subluxation as well as left shoulder adhesive capsulitis. She has chronic shoulder pain. Recommend trial of neuromuscular stimulation. Recommend shoulder injection if this does not improve. She may be a candidate for percutaneous neuromuscular electrical stimulation   4. Severe spasticity right upper extremity. She's had some improvement with Botox injection On 09/17/2014 however I would recommend higher dose For the next injection. [place]

## 2014-10-29 NOTE — Patient Instructions (Signed)
Knee Injection, Care After Refer to this sheet in the next few weeks. These instructions provide you with information about caring for yourself after your procedure. Your health care provider may also give you more specific instructions. Your treatment has been planned according to current medical practices, but problems sometimes occur. Call your health care provider if you have any problems or questions after your procedure. WHAT TO EXPECT AFTER THE PROCEDURE After your procedure, it is common to have:  Soreness.  Warmth.  Swelling. You may have more pain, swelling, and warmth than you did before the injection. This reaction may last for about one day.  HOME CARE INSTRUCTIONS Bathing  If you were given a bandage (dressing), keep it dry until your health care provider says it can be removed. Ask your health care provider when you can start showering or taking a bath. Managing Pain, Stiffness, and Swelling  If directed, apply ice to the injection area:  Put ice in a plastic bag.  Place a towel between your skin and the bag.  Leave the ice on for 20 minutes, 2-3 times per day.  Do not apply heat to your knee.  Raise the injection area above the level of your heart while you are sitting or lying down. Activity  Avoid strenuous activities for as long as directed by your health care provider. Ask your health care provider when you can return to your normal activities. General Instructions  Take medicines only as directed by your health care provider.  Do not take aspirin or other over-the-counter medicines unless your health care provider says you can.  Check your injection site every day for signs of infection. Watch for:  Redness, swelling, or pain.  Fluid, blood, or pus.  Follow your health care provider's instructions about dressing changes and removal. SEEK MEDICAL CARE IF:  You have symptoms at your injection site that last longer than two days after your  procedure.  You have redness, swelling, or pain in your injection area.  You have fluid, blood, or pus coming from your injection site.  You have warmth in your injection area.  You have a fever.  Your pain is not controlled with medicine. SEEK IMMEDIATE MEDICAL CARE IF:  Your knee turns very red.  Your knee becomes very swollen.  Your knee pain is severe.   This information is not intended to replace advice given to you by your health care provider. Make sure you discuss any questions you have with your health care provider.   Document Released: 01/12/2014 Document Reviewed: 01/12/2014 Elsevier Interactive Patient Education 2016 Elsevier Inc.  

## 2014-10-30 ENCOUNTER — Encounter: Payer: Self-pay | Admitting: Occupational Therapy

## 2014-10-30 ENCOUNTER — Ambulatory Visit: Payer: Medicare Other

## 2014-10-30 ENCOUNTER — Ambulatory Visit: Payer: Medicare Other | Admitting: Occupational Therapy

## 2014-10-30 ENCOUNTER — Encounter: Payer: Self-pay | Admitting: Physical Therapy

## 2014-10-30 ENCOUNTER — Ambulatory Visit: Payer: Medicare Other | Admitting: Physical Therapy

## 2014-10-30 DIAGNOSIS — R201 Hypoesthesia of skin: Secondary | ICD-10-CM

## 2014-10-30 DIAGNOSIS — R2681 Unsteadiness on feet: Secondary | ICD-10-CM

## 2014-10-30 DIAGNOSIS — R258 Other abnormal involuntary movements: Secondary | ICD-10-CM | POA: Diagnosis not present

## 2014-10-30 DIAGNOSIS — I69351 Hemiplegia and hemiparesis following cerebral infarction affecting right dominant side: Secondary | ICD-10-CM

## 2014-10-30 DIAGNOSIS — Z7409 Other reduced mobility: Secondary | ICD-10-CM

## 2014-10-30 DIAGNOSIS — R4189 Other symptoms and signs involving cognitive functions and awareness: Secondary | ICD-10-CM

## 2014-10-30 DIAGNOSIS — R414 Neurologic neglect syndrome: Secondary | ICD-10-CM

## 2014-10-30 DIAGNOSIS — M79605 Pain in left leg: Secondary | ICD-10-CM

## 2014-10-30 DIAGNOSIS — IMO0002 Reserved for concepts with insufficient information to code with codable children: Secondary | ICD-10-CM

## 2014-10-30 DIAGNOSIS — R600 Localized edema: Secondary | ICD-10-CM

## 2014-10-30 DIAGNOSIS — M25621 Stiffness of right elbow, not elsewhere classified: Secondary | ICD-10-CM

## 2014-10-30 DIAGNOSIS — M79641 Pain in right hand: Secondary | ICD-10-CM

## 2014-10-30 DIAGNOSIS — R4701 Aphasia: Secondary | ICD-10-CM

## 2014-10-30 DIAGNOSIS — M623 Immobility syndrome (paraplegic): Secondary | ICD-10-CM | POA: Diagnosis not present

## 2014-10-30 DIAGNOSIS — M25511 Pain in right shoulder: Secondary | ICD-10-CM

## 2014-10-30 DIAGNOSIS — M2560 Stiffness of unspecified joint, not elsewhere classified: Secondary | ICD-10-CM

## 2014-10-30 DIAGNOSIS — R252 Cramp and spasm: Secondary | ICD-10-CM

## 2014-10-30 DIAGNOSIS — I69951 Hemiplegia and hemiparesis following unspecified cerebrovascular disease affecting right dominant side: Secondary | ICD-10-CM

## 2014-10-30 NOTE — Therapy (Signed)
[facility] [address] [place], [place], [address] Phone: [phone]   Fax:  [phone]  Occupational Therapy Treatment  Patient Details  Name: Sandy Barrett MRN: [id number] Date of Birth: July 24, 1932 No Data Recorded  Encounter Date: 10/30/2014      OT End of Session - 10/30/14 1419    Visit Number 8   Number of Visits 16   Date for OT Re-Evaluation 11/13/14   Authorization Type medicare - pt will need G code and progress note every 10th visit   Authorization Time Period 60 days   OT Start Time 1317   OT Stop Time 1400   OT Time Calculation (min) 43 min   Activity Tolerance Patient tolerated treatment well      Past Medical History  Diagnosis Date  . HYPERTENSION   . HYPERTHYROIDISM   . OBESITY   . ARTHRITIS   . Posttraumatic stress disorder   . INSOMNIA   . MIGRAINE HEADACHE   . Hyperthyroidism     s/p I-131 ablation 03/2011 of multinod goiter  . Arthritis   . Stroke [facility])     Past Surgical History  Procedure Laterality Date  . Abdominal hysterectomy  1976  . Total hip arthroplasty  1998     right  . Tonsillectomy    . Breast surgery      biopsy  . Radiology with anesthesia Left 03/08/2014    Procedure: RADIOLOGY WITH ANESTHESIA;  Surgeon: Rob Hickman, MD;  Location: [facility];  Service: Radiology;  Laterality: Left;    There were no vitals filed for this visit.  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 --  pt unable to state or rate. Does grimace with standing and then rubs her left knee once sitting. Pt appear to have far less pain in RUE and now tolerates some weight bearing, ROM and manual therapy to R hand. R hand with no edema today                      OT Treatments/Exercises (OP) - 10/30/14 0001    Neurological Re-education Exercises   Other Exercises 1 Neuro re ed to address sit to stand facing grab bar to simulate functional sit to squat pt does at home with steady.  Pt able to complete with mod a x2 .Pt required  max facilitation for alignment in standing but then was able to stand for brief period with bar with min a x1. Pt total assist for stand to sit as pt simply falls into chair.  Pt does c/o of L knee pain but able to take weight on both LE's. RLE flexed but active.  Also addressed active sliding board transfers mat to wheelchair. Pt assisted with placing board. Once on sliding board and with cueing to reach for arm of chair, pt  able to slide with min a to left.  Pt is very apraxic and has significant perceputal defecits therefore it is crucial that pt initiate movement. Instructed dtr in how to faciliate sliding board transfers and in scooting pt forward and back in wheelchair - will have dtr return demonstrate next session.                   OT Short Term Goals - 10/30/14 1415    OT SHORT TERM GOAL #1   Title Pt and dtr will be mod I with HEP - 10/16/2014   Status Achieved   OT SHORT TERM GOAL #2   Title Pt will tolerate UE positioning in wheelchair and bed to protect shoulder girdle, reduce edema and reduce pain   Status On-going  awating desk length arm rests for wheelchair   OT [place] #3   Title Pt's dtr will be I with retrograde massage to RUE   Status Achieved   OT SHORT TERM GOAL #4   Title Pt will be able to  sit EOM wth supervision only   Status Achieved   OT SHORT TERM GOAL #5   Title Pt and dtr will be mod I with splint wear and care   Status Achieved           OT Long Term Goals - 10/30/14 1416    OT LONG TERM GOAL #1   Title Pt and dtr will be mod I with upgraded HEP prn - 11/13/2014   Status On-going   OT LONG TERM GOAL #2   Title Pt will be able to wash face with min a   Status Achieved   OT LONG TERM GOAL #3   Title Pt will be able to eat at least 50% of her meal with utensil and min a   Status On-going   OT LONG TERM GOAL #4   Title Pt will require mod a to transfer to drop arm commode   Status On-going               Plan - 10/30/14 1417    Clinical Impression Statement Pt is making slow but steady progress toward goals. Pt with significantly reduced edema and pain in RUE and dtr reports pt tolerates HEP and retrograde massage. Pt much more willing to allow incorporation of RUE into therapy session.    Pt will benefit from skilled therapeutic intervention in order to improve on the following deficits (Retired) Decreased activity tolerance;Decreased balance;Decreased cognition;Decreased knowledge of use of DME;Decreased mobility;Decreased range of motion;Decreased safety awareness;Decreased strength;Increased edema;Impaired UE functional use;Impaired tone;Impaired sensation;Impaired flexibility;Impaired vision/preception;Pain   Rehab Potential Fair   Clinical Impairments Affecting Rehab Potential severity of deficits   OT Frequency 2x / week   OT Duration 8 weeks   OT Treatment/Interventions Self-care/ADL training;Ultrasound;Moist Heat;Contrast Bath;DME and/or AE instruction;Neuromuscular education;Therapeutic exercise;Functional Mobility Training;Manual Therapy;Passive range of motion;Splinting;Therapeutic activities;Balance training;Patient/family education;Visual/perceptual remediation/compensation;Cognitive remediation/compensation   Plan self feeding, UE positioning  if arm rests and lap tray are here, commode transfers   Consulted and Agree with Plan of Care Patient;Family member/caregiver   Family Member Consulted dtr        Problem List Patient Active Problem List   Diagnosis Date Noted  . Primary osteoarthritis of left knee 10/29/2014  . Urinary tract infectious disease 08/21/2014  . Primary osteoarthritis involving multiple joints 07/26/2014  . Pernicious anemia 07/26/2014  . Low TSH level 07/26/2014  . Hearing loss 07/26/2014  . Decreased vision in both  eyes 07/26/2014  . Encounter for therapeutic drug monitoring 07/17/2014  . Spastic hemiparesis affecting dominant side ([place]) 05/31/2014  . Acute pulmonary embolism ([place]) 03/14/2014  . DVT of lower extremity, bilateral ([place]) 03/14/2014  . Global aphasia 03/14/2014  . 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 Dysphagia due to recent stroke 03/14/2014  . Aphasia due to stroke 03/13/2014  . Right hemiparesis ([place]) 03/13/2014  . Embolic stroke ([place])   . Cerebral infarction due to embolism of left middle cerebral artery ([place])   . Cerebral infarction due to unspecified mechanism   . Cerebral infarction due to thrombosis of right middle cerebral artery ([place])   . Stroke ([place]) 03/08/2014  . Pulmonary embolism ([place]) 03/08/2014  . CVA (cerebral infarction)   . Right sided weakness   . Back pain 08/29/2013  . Primary localized osteoarthrosis, lower leg 03/06/2013  . IBS (irritable bowel syndrome)   . 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 Multinodular goiter 01/13/2011  . Abnormal chest x-ray 12/12/2010  . HYPERTHYROIDISM 11/11/2009  . Insomnia 07/03/2009  . POSTTRAUMATIC STRESS DISORDER 08/22/2008  . OBESITY 04/21/2008  . Osteoarthritis 04/21/2008  . MIGRAINE HEADACHE 04/20/2008  . Essential hypertension 04/20/2008    Quay Burow, OTR/L 10/30/2014, 2:22 PM  [facility] [address] [place], [place], [address] Phone:  [phone]   Fax:  [phone]  Name: Sandy Barrett MRN: [id number] Date of Birth: 1932-09-30

## 2014-10-30 NOTE — Therapy (Signed)
[facility] [address] [place], [place], [address] Phone: [phone]   Fax:  [phone]  Speech Language Pathology Treatment  Patient Details  Name: Sandy Barrett MRN: [id number] Date of Birth: Aug 20, 1932 No Data Recorded  Encounter Date: 10/30/2014      End of Session - 10/30/14 1447    Visit Number 6   Number of Visits 25   Date for SLP Re-Evaluation 12/07/14   SLP Start Time 1405   SLP Stop Time  1446   SLP Time Calculation (min) 41 min   Activity Tolerance Patient tolerated treatment well      Past Medical History  Diagnosis Date  . HYPERTENSION   . HYPERTHYROIDISM   . OBESITY   . ARTHRITIS   . Posttraumatic stress disorder   . INSOMNIA   . MIGRAINE HEADACHE   . Hyperthyroidism     s/p I-131 ablation 03/2011 of multinod goiter  . Arthritis   . Stroke [facility])     Past Surgical History  Procedure Laterality Date  . Abdominal hysterectomy  1976  . Total hip arthroplasty  1998     right  . Tonsillectomy    . Breast surgery      biopsy  . Radiology with anesthesia Left 03/08/2014    Procedure: RADIOLOGY WITH ANESTHESIA;  Surgeon: Rob Hickman, MD;  Location: [facility];  Service: Radiology;  Laterality: Left;    There were no vitals filed for this visit.  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 --  not indicated to begin session               ADULT SLP TREATMENT - 10/30/14 1408    General Information   Behavior/Cognition Alert;Pleasant mood;Cooperative   Treatment Provided   Treatment provided Cognitive-Linquistic   Cognitive-Linquistic Treatment   Treatment focused on Aphasia;Apraxia   Skilled Treatment Simultaneous production of consonant vowel words (CV) 10% with max  A. Automatic speech <10%; singing simultaneous with visual cues- initial rep approx 30%, incr'd to 40-50% by third rep. Pt indicated yes/no on board <10%.    Assessment / Recommendations / Plan   Plan Continue with current plan of care   Progression Toward Goals   Progression toward goals Progressing toward goals          SLP Education - 10/30/14 1447    Education Details singing at home with pt salient and memorized songs   Person(s) Educated Child(ren)   Methods Explanation;Demonstration   Comprehension Verbalized understanding          SLP Short Term Goals - 10/30/14 1409    SLP SHORT TERM GOAL #1   Title pt will receptively ID pictures or objects from f:3, given name of picture/object 30%   Time 1   Period Weeks   Status On-going   SLP SHORT TERM GOAL #2   Title pt will definitively ID yes/no responses via communication board or head movements 40% of the time   Time 1   Period Weeks   Status Revised   SLP SHORT TERM GOAL #3   Title pt will imitate consonant-vowel syllables/words with 40% success   Time 1   Period Weeks   Status On-going   SLP SHORT TERM GOAL #4   Title  pt will perform automatic speech tasks independently with average 30% success including counting 1-20, DOW,    Time 1   Period Weeks   Status On-going          SLP Long Term Goals - 10/30/14 1450    SLP LONG TERM GOAL #1   Title pt will receptively ID pictures/objects from f:3 40% success   Time 5   Period Weeks   Status On-going   SLP LONG TERM GOAL #2   Title pt will perform activities at home with family as reported by family members at least 4 days per week   Time 5   Period Weeks   Status On-going   SLP LONG TERM GOAL #3   Title pt will ID yes/no responses with head movement or communication board with 50% of the time   Time 5   Period Weeks   Status Revised   SLP LONG TERM GOAL #4   Title pt will answer yes/no questions re: functional information with 90% success   Time 5    Period Weeks   Status On-going   SLP LONG TERM GOAL #5   Title pt will perform automatic speech tasks with average 40% success including DOW and counting 1-20   Time 5   Period Weeks   Status On-going   SLP LONG TERM GOAL #6   Title pt will use multimodal communication to expressively communicate in 70% of opportunities   Time 7   Period Weeks   Status On-going          Plan - 10/30/14 1448    Clinical Impression Statement Severe/profound language deficits cont, 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 Pt to cont skilled ST to maximize language skills and decr caregiver burden.   Speech Therapy Frequency 3x / week  but pt appears to be scheduling x2/week   Duration --  5 weeks   Treatment/Interventions Patient/family education;Multimodal communcation approach;Language facilitation;SLP instruction and feedback;Compensatory strategies;Cueing hierarchy;Functional tasks   Potential to Achieve Goals Fair   Potential Considerations Severity of impairments        Problem List Patient Active Problem List   Diagnosis Date Noted  . Primary osteoarthritis of left knee 10/29/2014  . Urinary tract infectious disease 08/21/2014  . Primary osteoarthritis involving multiple joints 07/26/2014  . Pernicious anemia 07/26/2014  . Low TSH level 07/26/2014  . Hearing loss 07/26/2014  . Decreased vision in both eyes 07/26/2014  . Encounter for therapeutic drug monitoring 07/17/2014  . Spastic hemiparesis affecting dominant side ([place]) 05/31/2014  . Acute pulmonary embolism ([place]) 03/14/2014  . DVT of lower extremity, bilateral ([place]) 03/14/2014  . Global aphasia 03/14/2014  . 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 Dysphagia due to recent stroke 03/14/2014  . Aphasia due to stroke 03/13/2014  . Right hemiparesis ([place]) 03/13/2014  . Embolic stroke ([place])   . Cerebral infarction due to embolism of left middle cerebral artery ([place])   . Cerebral infarction due to unspecified mechanism   . Cerebral infarction  due to thrombosis of right middle cerebral artery ([place])   . Stroke ([place]) 03/08/2014  . Pulmonary embolism ([place]) 03/08/2014  . CVA (cerebral infarction)   . Right sided weakness   . Back pain 08/29/2013  . Primary localized osteoarthrosis, lower leg 03/06/2013  . IBS (irritable bowel syndrome)   . 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 Multinodular goiter 01/13/2011  . Abnormal chest x-ray 12/12/2010  . HYPERTHYROIDISM 11/11/2009  . Insomnia 07/03/2009  . POSTTRAUMATIC STRESS DISORDER 08/22/2008  .  OBESITY 04/21/2008  . Osteoarthritis 04/21/2008  . MIGRAINE HEADACHE 04/20/2008  . Essential hypertension 04/20/2008    [facility] , [place], [place]  10/30/2014, 2:50 PM  [facility] [address] [place], [place], [address] Phone: [phone]   Fax:  [phone]   Name: Khaliah Barnick MRN: [id number] Date of Birth: [birth year]

## 2014-10-30 NOTE — Therapy (Signed)
[facility] [address] [place], [place], [address] Phone: [phone]   Fax:  [phone]  Physical Therapy Treatment  Patient Details  Name: Sandy Barrett MRN: [id number] Date of Birth: [birth year] No Data Recorded  Encounter Date: 10/30/2014   10/30/14 1239  PT Visits / Re-Eval  Visit Number 7  Number of Visits 17  Date for PT Re-Evaluation 11/16/14  Authorization  Authorization Type Medicare G-Code every 10th visit  PT Time Calculation  PT Start Time 1233  PT Stop Time 1315  PT Time Calculation (min) 42 min  PT - End of Session  Equipment Utilized During Treatment Gait belt  Activity Tolerance Patient tolerated treatment well;Patient limited by pain  Behavior During Therapy [facility] for tasks assessed/performed     Past Medical History  Diagnosis Date  . HYPERTENSION   . HYPERTHYROIDISM   . OBESITY   . ARTHRITIS   . Posttraumatic stress disorder   . INSOMNIA   . MIGRAINE HEADACHE   . Hyperthyroidism     s/p I-131 ablation 03/2011 of multinod goiter  . Arthritis   . Stroke [facility])     Past Surgical History  Procedure Laterality Date  . Abdominal hysterectomy  1976  . Total hip arthroplasty  1998     right  . Tonsillectomy    . Breast surgery      biopsy  . Radiology with anesthesia Left 03/08/2014    Procedure: RADIOLOGY WITH ANESTHESIA;  Surgeon: Rob Hickman, MD;  Location: [facility];  Service: Radiology;  Laterality: Left;    There were no vitals filed for this visit.  Visit Diagnosis:   Stiffness due to immobility    Hemiparesis affecting right side as late effect of cerebrovascular accident ([place])   .  Decreased transfer ability     Unsteadiness    Lower limb pain, diffuse, left        Subjective Assessment - 10/30/14 1238    Subjective No falls or pain reported at this time. Saw Md on Monday and had injection to left knee for pain.    Currently in Pain? No/denies   Pain Score  0-No pain     Treatment: Slide board transfer to right from wheelchair to mat with max to total assist. Pt attempting to use left arm and did demo good trunk control with transfer.   inverted chair behind pt with pillows: partial sit ups with emphasis on midline position with cues, assist and facilitation.   Lateral forearm propping on mat and then returning to unsupported sitting x 5 each way with cues, assist and facilitation.  Cone retrieval from floor, then on a stool in front of her progressing in direction toward right from left, facilitaiton needed for weight shifting, especially forward and toward right side.  With right leg - long arc quads blocked practice with emphasis on quad acitvation, active assist with muscle activitaion noted - marching while propped on inverted chair blocked practice with emphasis on hip flexion activition, active assist  Palpable grinding in right knee noted with exercises.        PT Short Term Goals - 10/23/14 1230    PT SHORT TERM GOAL #1   Title Family demonstrates updated, initial HEP correctly with minimal cues. (Target Date: 10/18/2014)   Baseline MET 10/23/2014 with HEP instructed to date.   Time 4   Period Weeks   Status Achieved   PT SHORT TERM GOAL #2   Title Sliding Board Transfers w/c to  level surface with maximal assist.  (Target Date: 10/18/2014)   Baseline MET 10/23/2014   Time 4   Period Weeks   Status Achieved   PT SHORT TERM GOAL #3   Title Patient performs sitting balance at edge of mat reaching 2" anteriorly & left and across midline with left UE with contact assist.  (Target Date: 10/18/2014)   Baseline MET 10/23/2014   Time 4   Period Weeks   Status Achieved           PT Long Term Goals - 10/23/14 1230    PT LONG TERM GOAL #1   Title Family demonstrates updated HEP and verbalizes benefits for decreasing LE pain associated with stiffness.  (Target Date: 11/16/2014)   Time 8   Period Weeks   Status On-going    PT LONG TERM GOAL #2   Title Family performs car transfer with patient with sliding board safely.  (Target Date: 11/16/2014)   Time 8   Period Weeks   Status On-going   PT LONG TERM GOAL #3   Title Patient able to reach 5" anteriorly, left & across midline with left UE sitting at edge of mat or w/c with feet on floor with supervision.  (Target Date: 11/16/2014)   Time 8   Period Weeks   Status On-going   PT LONG TERM GOAL #4   Title Patient able to roll left & right with rail and boost buttocks in bed to aide with self-care with cues only.  (Target Date: 11/16/2014)   Time 8   Period Weeks   Status On-going      10/30/14 1239  Plan  Clinical Impression Statement Move movement of pt's right leg noted today with exercises than with previous sessions. Pt still reporting pain on left side with weight bearing and movement, however not as often duirng session. Pt making steady progress toward goals   Pt will benefit from skilled therapeutic intervention in order to improve on the following deficits Abnormal gait;Decreased activity tolerance;Decreased balance;Decreased mobility;Decreased range of motion;Decreased strength;Pain  Rehab Potential Good  PT Frequency 2x / week  PT Duration 8 weeks  PT Treatment/Interventions ADLs/Self Care Home Management;DME Instruction;Functional mobility training;Therapeutic activities;Therapeutic exercise;Balance training;Neuromuscular re-education;Patient/family education;Wheelchair mobility training  PT Next Visit Plan continue to work on seated balance, slide board transfers, bilateral lower extremity strengthening   Consulted and Agree with Plan of Care Patient;Family member/caregiver  Family Member Consulted dtr, [name]    Problem List Patient Active Problem List   Diagnosis Date Noted  . Primary osteoarthritis of left knee 10/29/2014  . Urinary tract infectious disease 08/21/2014  . Primary osteoarthritis involving multiple joints 07/26/2014  .  Pernicious anemia 07/26/2014  . Low TSH level 07/26/2014  . Hearing loss 07/26/2014  . Decreased vision in both eyes 07/26/2014  . Encounter for therapeutic drug monitoring 07/17/2014  . Spastic hemiparesis affecting dominant side ([place]) 05/31/2014  . Acute pulmonary embolism ([place]) 03/14/2014  . DVT of lower extremity, bilateral ([place]) 03/14/2014  . Global aphasia 03/14/2014  . 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 Dysphagia due to recent stroke 03/14/2014  . Aphasia due to stroke 03/13/2014  . Right hemiparesis ([place]) 03/13/2014  . Embolic stroke ([place])   . Cerebral infarction due to embolism of left middle cerebral artery ([place])   . Cerebral infarction due to unspecified mechanism   . Cerebral infarction due to thrombosis of right middle cerebral artery ([place])   . Stroke ([place]) 03/08/2014  . Pulmonary embolism ([place]) 03/08/2014  .  CVA (cerebral infarction)   . Right sided weakness   . Back pain 08/29/2013  . Primary localized osteoarthrosis, lower leg 03/06/2013  . IBS (irritable bowel syndrome)   . 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 Multinodular goiter 01/13/2011  . Abnormal chest x-ray 12/12/2010  . HYPERTHYROIDISM 11/11/2009  . Insomnia 07/03/2009  . POSTTRAUMATIC STRESS DISORDER 08/22/2008  . OBESITY 04/21/2008  . Osteoarthritis 04/21/2008  . MIGRAINE HEADACHE 04/20/2008  . Essential hypertension 04/20/2008    Willow Ora 10/30/2014, 12:45 PM  Willow Ora, PTA, [facility] [address], [place], [place] [address] [phone] 10/30/2014, 12:45 PM  Name: Sandy Barrett MRN: [id number] Date of Birth: 06-04-1932

## 2014-10-30 NOTE — Patient Instructions (Signed)
Sing Happy Rudene Anda, amazing Shirlee Limerick, and Jesus Loves Me at home, along with any other songs [name] may have memorized

## 2014-10-31 ENCOUNTER — Telehealth: Payer: Self-pay | Admitting: Internal Medicine

## 2014-10-31 ENCOUNTER — Ambulatory Visit: Payer: Medicare Other

## 2014-10-31 NOTE — Telephone Encounter (Signed)
Sandy Barrett is requesting another letter from the 09/07/2014 letter drafted with updated dates.   September 07, 2014   Sandy Barrett [address] [place] [address]  RE: Sandy Barrett - Leave of Absence    To Whom It May Concern:  This letter is written for Sandy Barrett who is the daughter of a patient here at [facility]. She is requesting a Leave of Absence to care for her parent.  It is my medical opinion that our patient currently needs around the clock care and supervision that resulted after a neurologic episode. In addition, the side effects of said event will likely be irreversible.   The patients information is listed at the top for your convenience. If you have any questions, concerns, or need any additional information please do not hesitate to call.   Sincerely,     Janith Lima, MD

## 2014-10-31 NOTE — Telephone Encounter (Signed)
Please draft. unfortunately i do not have access to draft letters

## 2014-10-31 NOTE — Telephone Encounter (Signed)
This is ok with me  

## 2014-11-01 NOTE — Telephone Encounter (Signed)
Done. Pt informed, letter at front

## 2014-11-02 ENCOUNTER — Encounter: Payer: Self-pay | Admitting: Physical Therapy

## 2014-11-02 ENCOUNTER — Ambulatory Visit: Payer: Medicare Other | Admitting: Occupational Therapy

## 2014-11-02 ENCOUNTER — Ambulatory Visit: Payer: Medicare Other

## 2014-11-02 ENCOUNTER — Ambulatory Visit: Payer: Medicare Other | Admitting: Physical Therapy

## 2014-11-02 DIAGNOSIS — R414 Neurologic neglect syndrome: Secondary | ICD-10-CM

## 2014-11-02 DIAGNOSIS — M79605 Pain in left leg: Secondary | ICD-10-CM

## 2014-11-02 DIAGNOSIS — R4189 Other symptoms and signs involving cognitive functions and awareness: Secondary | ICD-10-CM

## 2014-11-02 DIAGNOSIS — M79641 Pain in right hand: Secondary | ICD-10-CM

## 2014-11-02 DIAGNOSIS — Z7409 Other reduced mobility: Secondary | ICD-10-CM

## 2014-11-02 DIAGNOSIS — I69951 Hemiplegia and hemiparesis following unspecified cerebrovascular disease affecting right dominant side: Secondary | ICD-10-CM

## 2014-11-02 DIAGNOSIS — R4701 Aphasia: Secondary | ICD-10-CM

## 2014-11-02 DIAGNOSIS — M2560 Stiffness of unspecified joint, not elsewhere classified: Secondary | ICD-10-CM

## 2014-11-02 DIAGNOSIS — I69351 Hemiplegia and hemiparesis following cerebral infarction affecting right dominant side: Secondary | ICD-10-CM

## 2014-11-02 DIAGNOSIS — R2681 Unsteadiness on feet: Secondary | ICD-10-CM | POA: Diagnosis not present

## 2014-11-02 DIAGNOSIS — M623 Immobility syndrome (paraplegic): Secondary | ICD-10-CM | POA: Diagnosis not present

## 2014-11-02 DIAGNOSIS — R258 Other abnormal involuntary movements: Secondary | ICD-10-CM | POA: Diagnosis not present

## 2014-11-02 NOTE — Therapy (Signed)
[facility] [address] [place], [place], [address] Phone: [phone]   Fax:  [phone]  Occupational Therapy Treatment  Patient Details  Name: Sandy Barrett MRN: [id number] Date of Birth: 1932/10/25 No Data Recorded  Encounter Date: 11/02/2014      OT End of Session - 11/02/14 1607    Visit Number 9   Number of Visits 16   Date for OT Re-Evaluation 11/13/14   Authorization Type medicare - pt will need G code and progress note every 10th visit   Authorization Time Period G code next visit   OT Start Time 1319   OT Stop Time 1400   OT Time Calculation (min) 41 min   Activity Tolerance Patient tolerated treatment well   Behavior During Therapy [facility] for tasks assessed/performed      Past Medical History  Diagnosis Date  . HYPERTENSION   . HYPERTHYROIDISM   . OBESITY   . ARTHRITIS   . Posttraumatic stress disorder   . INSOMNIA   . MIGRAINE HEADACHE   . Hyperthyroidism     s/p I-131 ablation 03/2011 of multinod goiter  . Arthritis   . Stroke [facility])     Past Surgical History  Procedure Laterality Date  . Abdominal hysterectomy  1976  . Total hip arthroplasty  1998     right  . Tonsillectomy    . Breast surgery      biopsy  . Radiology with anesthesia Left 03/08/2014    Procedure: RADIOLOGY WITH ANESTHESIA;  Surgeon: Rob Hickman, MD;  Location: [facility];  Service: Radiology;  Laterality: Left;    There were no vitals filed for this visit.  Visit Diagnosis:  Impaired cognition  Pain of right hand  Hemiplegia affecting right side in right-dominant patient as late effect of cerebrovascular disease ([place])  Right-sided visual neglect      Subjective Assessment - 11/02/14 1603    Patient is accompained by: Family member   Pertinent History see epic snapshot,    Patient Stated Goals Pt unable to state due to global aphasia;  dtr states to improve in any way she can especially mobility, reduce  pain   Currently in Pain? Yes  unable to rate mild pain   Pain Location Arm   Pain Orientation Right   Pain Type Chronic pain      Treatment: Transfer mat-w/c with sliding board and total A(2 person for safety) pt performed 40%, Pt demonstrated improved upright posture today and did not demonstrate significant pusher syndrome during transfer. Self feeding activity with applesauce and bowl, pt performed with spoon and  only min v.c. to slow down, Pt required cueing to wipe mouth and she attempted to put napkin in mouth. Gentle P/ROM to fingers wrist and forearm, followed by pt performing gentle self ROM elbow flexion/ extension, and shoulder flex/ lean forward, with max A/ v.c. Gentle shoulder flexion self ROM along table top, max facilitation. Pt demonstrates improved tolerance for P/ROM, self ROM                           OT Short Term Goals - 10/30/14 1415    OT SHORT TERM GOAL #1   Title Pt and dtr will be mod I with HEP - 10/16/2014   Status Achieved   OT SHORT TERM GOAL #2   Title Pt will tolerate UE positioning in wheelchair and bed to protect shoulder girdle, reduce edema and reduce pain  Status On-going  awating desk length arm rests for wheelchair   OT SHORT TERM GOAL #3   Title Pt's dtr will be I with retrograde massage to RUE   Status Achieved   OT SHORT TERM GOAL #4   Title Pt will be able to sit EOM wth supervision only   Status Achieved   OT SHORT TERM GOAL #5   Title Pt and dtr will be mod I with splint wear and care   Status Achieved           OT Long Term Goals - 10/30/14 1416    OT LONG TERM GOAL #1   Title Pt and dtr will be mod I with upgraded HEP prn - 11/13/2014   Status On-going   OT LONG TERM GOAL #2   Title Pt will be able to wash face with min a   Status Achieved   OT LONG TERM GOAL #3   Title Pt will be able to eat at least 50% of her meal with utensil and min a   Status On-going   OT LONG TERM GOAL #4   Title Pt will  require mod a to transfer to drop arm commode   Status On-going               Plan - 11/02/14 1605    Clinical Impression Statement Pt demonstrates improved upright posture today with transfer mat to w/c and improved willingness to incorporate RUE into activity.   Pt will benefit from skilled therapeutic intervention in order to improve on the following deficits (Retired) Decreased activity tolerance;Decreased balance;Decreased cognition;Decreased knowledge of use of DME;Decreased mobility;Decreased range of motion;Decreased safety awareness;Decreased strength;Increased edema;Impaired UE functional use;Impaired tone;Impaired sensation;Impaired flexibility;Impaired vision/preception;Pain   Rehab Potential Fair   Clinical Impairments Affecting Rehab Potential severity of deficits   OT Frequency 2x / week   OT Duration 8 weeks   OT Treatment/Interventions Self-care/ADL training;Ultrasound;Moist Heat;Contrast Bath;DME and/or AE instruction;Neuromuscular education;Therapeutic exercise;Functional Mobility Training;Manual Therapy;Passive range of motion;Splinting;Therapeutic activities;Balance training;Patient/family education;Visual/perceptual remediation/compensation;Cognitive remediation/compensation   Plan UE positioning when arm rests and lap tray available, transfers, sitting balance   Consulted and Agree with Plan of Care Patient;Family member/caregiver   Family Member Consulted dtr        Problem List Patient Active Problem List   Diagnosis Date Noted  . Primary osteoarthritis of left knee 10/29/2014  . Urinary tract infectious disease 08/21/2014  . Primary osteoarthritis involving multiple joints 07/26/2014  . Pernicious anemia 07/26/2014  . Low TSH level 07/26/2014  . Hearing loss 07/26/2014  . Decreased vision in both eyes 07/26/2014  . Encounter for therapeutic drug monitoring 07/17/2014  . Spastic hemiparesis affecting dominant side ([place]) 05/31/2014  . Acute pulmonary  embolism ([place]) 03/14/2014  . DVT of lower extremity, bilateral ([place]) 03/14/2014  . Global aphasia 03/14/2014  . 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 Dysphagia due to recent stroke 03/14/2014  . Aphasia due to stroke 03/13/2014  . Right hemiparesis ([place]) 03/13/2014  . Embolic stroke ([place])   . Cerebral infarction due to embolism of left middle cerebral artery ([place])   . Cerebral infarction due to unspecified mechanism   . Cerebral infarction due to thrombosis of right middle cerebral artery ([place])   . Stroke ([place]) 03/08/2014  . Pulmonary embolism ([place]) 03/08/2014  . CVA (cerebral infarction)   . Right sided weakness   . Back pain 08/29/2013  . Primary localized osteoarthrosis, lower leg 03/06/2013  . IBS (irritable bowel syndrome)   .  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 Multinodular goiter 01/13/2011  . Abnormal chest x-ray 12/12/2010  . HYPERTHYROIDISM 11/11/2009  . Insomnia 07/03/2009  . POSTTRAUMATIC STRESS DISORDER 08/22/2008  . OBESITY 04/21/2008  . Osteoarthritis 04/21/2008  . MIGRAINE HEADACHE 04/20/2008  . Essential hypertension 04/20/2008    RINE,KATHRYN 11/02/2014, 4:09 PM Theone Murdoch, OTR/L [phone] Phone: [phone] 4:09 PM 11/02/2014 [facility] [address] [place], [place], [address] Phone: [phone]   Fax:  [phone]  Name: Ayn Domangue MRN: [id number] Date of Birth: Mar 28, 1932

## 2014-11-03 NOTE — Therapy (Signed)
[facility] [address] [place], [place], [address] Phone: [phone]   Fax:  [phone]  Physical Therapy Treatment  Patient Details  Name: Sandy Barrett MRN: [id number] Date of Birth: November 28, 1932 No Data Recorded  Encounter Date: 11/02/2014     11/02/14 1240  PT Visits / Re-Eval  Visit Number 8  Number of Visits 17  Date for PT Re-Evaluation 11/16/14  Authorization  Authorization Type Medicare G-Code every 10th visit  PT Time Calculation  PT Start Time 1235  PT Stop Time 1315  PT Time Calculation (min) 40 min  PT - End of Session  Equipment Utilized During Treatment Gait belt  Activity Tolerance Patient tolerated treatment well;Patient limited by pain  Behavior During Therapy [facility] for tasks assessed/performed    Past Medical History  Diagnosis Date  . HYPERTENSION   . HYPERTHYROIDISM   . OBESITY   . ARTHRITIS   . Posttraumatic stress disorder   . INSOMNIA   . MIGRAINE HEADACHE   . Hyperthyroidism     s/p I-131 ablation 03/2011 of multinod goiter  . Arthritis   . Stroke [facility])     Past Surgical History  Procedure Laterality Date  . Abdominal hysterectomy  1976  . Total hip arthroplasty  1998     right  . Tonsillectomy    . Breast surgery      biopsy  . Radiology with anesthesia Left 03/08/2014    Procedure: RADIOLOGY WITH ANESTHESIA;  Surgeon: Rob Hickman, MD;  Location: [facility];  Service: Radiology;  Laterality: Left;    There were no vitals filed for this visit.  Visit Diagnosis:  Expressive aphasia  Receptive aphasia  Stiffness due to immobility  Decreased transfer ability  Hemiparesis affecting right side as late effect of cerebrovascular accident ([place])  Unsteadiness  Lower limb pain, diffuse, left     11/02/14 1239  Symptoms/Limitations  Subjective No falls reported by caregiver. Still with right UE pain per daughter.  Pain Assessment  Currently in Pain?  No/denies (no signs/indications at beginning of session)  Pain Score 0   Treatment: Attempted to stand at parallel bars with max to total assist x 2 people. Pt able to achieve partial standing with assist before needing to sit down due to pain in left leg.  Used standing frame to achieve standing with 2 person assist for safety. Pt able to stand in frame for 6-7 minutes with no reports/signs of pain with bil (mostly right) UE support on top of frame. Despite max encouragement pt would not engage right arm into any un weighted activity. Cues/facilitaiton for upright posture while in frame.  Max assist for slide board transfer wheelchair to mat going towards pt's right side (toward her weak side). Cues on technique, head position with scooting and to use left arm/leg to assist with scooting across the board.  Seated edge of mat: Lateral forearm propping on mat and then returning to unsupported sitting x 5 each way with cues, assist and facilitation.  Cone retrieval from floor, then on a stool in front of her progressing in direction toward right from left, facilitaiton needed for weight shifting, especially forward and toward right side.         PT Short Term Goals - 10/23/14 1230    PT SHORT TERM GOAL #1   Title Family demonstrates updated, initial HEP correctly with minimal cues. (Target Date: 10/18/2014)   Baseline MET 10/23/2014 with HEP instructed to date.   Time 4  Period Weeks   Status Achieved   PT SHORT TERM GOAL #2   Title Sliding Board Transfers w/c to level surface with maximal assist.  (Target Date: 10/18/2014)   Baseline MET 10/23/2014   Time 4   Period Weeks   Status Achieved   PT SHORT TERM GOAL #3   Title Patient performs sitting balance at edge of mat reaching 2" anteriorly & left and across midline with left UE with contact assist.  (Target Date: 10/18/2014)   Baseline MET 10/23/2014   Time 4   Period Weeks   Status Achieved           PT Long Term Goals  - 10/23/14 1230    PT LONG TERM GOAL #1   Title Family demonstrates updated HEP and verbalizes benefits for decreasing LE pain associated with stiffness.  (Target Date: 11/16/2014)   Time 8   Period Weeks   Status On-going   PT LONG TERM GOAL #2   Title Family performs car transfer with patient with sliding board safely.  (Target Date: 11/16/2014)   Time 8   Period Weeks   Status On-going   PT LONG TERM GOAL #3   Title Patient able to reach 5" anteriorly, left & across midline with left UE sitting at edge of mat or w/c with feet on floor with supervision.  (Target Date: 11/16/2014)   Time 8   Period Weeks   Status On-going   PT LONG TERM GOAL #4   Title Patient able to roll left & right with rail and boost buttocks in bed to aide with self-care with cues only.  (Target Date: 11/16/2014)   Time 8   Period Weeks   Status On-going        11/02/14 1240  Plan  Clinical Impression Statement Pt continues to be limited by pain with mobility, increased facial grimacing and withdrawl noted with left side weight bearing. Pt is making steady progress with unsupported seated balance and righting reactions with balance activities and weight shifting . Making slow, steady progress toward goals                                        Pt will benefit from skilled therapeutic intervention in order to improve on the following deficits Abnormal gait;Decreased activity tolerance;Decreased balance;Decreased mobility;Decreased range of motion;Decreased strength;Pain  Rehab Potential Good  PT Frequency 2x / week  PT Duration 8 weeks  PT Treatment/Interventions ADLs/Self Care Home Management;DME Instruction;Functional mobility training;Therapeutic activities;Therapeutic exercise;Balance training;Neuromuscular re-education;Patient/family education;Wheelchair mobility training  PT Next Visit Plan continue to work on seated balance, slide board transfers, bilateral lower extremity strengthening   Consulted and  Agree with Plan of Care Patient;Family member/caregiver  Family Member Consulted dtr, [name]      Problem List Patient Active Problem List   Diagnosis Date Noted  . Primary osteoarthritis of left knee 10/29/2014  . Urinary tract infectious disease 08/21/2014  . Primary osteoarthritis involving multiple joints 07/26/2014  . Pernicious anemia 07/26/2014  . Low TSH level 07/26/2014  . Hearing loss 07/26/2014  . Decreased vision in both eyes 07/26/2014  . Encounter for therapeutic drug monitoring 07/17/2014  . Spastic hemiparesis affecting dominant side ([place]) 05/31/2014  . Acute pulmonary embolism ([place]) 03/14/2014  . DVT of lower extremity, bilateral ([place]) 03/14/2014  . Global aphasia 03/14/2014  . 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 Dysphagia due to recent  stroke 03/14/2014  . Aphasia due to stroke 03/13/2014  . Right hemiparesis ([place]) 03/13/2014  . Embolic stroke ([place])   . Cerebral infarction due to embolism of left middle cerebral artery ([place])   . Cerebral infarction due to unspecified mechanism   . Cerebral infarction due to thrombosis of right middle cerebral artery ([place])   . Stroke ([place]) 03/08/2014  . Pulmonary embolism ([place]) 03/08/2014  . CVA (cerebral infarction)   . Right sided weakness   . Back pain 08/29/2013  . Primary localized osteoarthrosis, lower leg 03/06/2013  . IBS (irritable bowel syndrome)   . 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 Multinodular goiter 01/13/2011  . Abnormal chest x-ray 12/12/2010  . HYPERTHYROIDISM 11/11/2009  . Insomnia 07/03/2009  . POSTTRAUMATIC STRESS DISORDER 08/22/2008  . OBESITY 04/21/2008  . Osteoarthritis 04/21/2008  . MIGRAINE HEADACHE 04/20/2008  . Essential hypertension 04/20/2008    Willow Ora 11/03/2014, 11:13 PM  Willow Ora, PTA, [facility] [address], [place], [place] [address] [phone] 11/03/2014, 11:13 PM   Name: Sandy Barrett MRN: [id number] Date of Birth:  Dec 23, 1932

## 2014-11-05 ENCOUNTER — Ambulatory Visit: Payer: Medicare Other | Admitting: Internal Medicine

## 2014-11-06 ENCOUNTER — Encounter: Payer: Self-pay | Admitting: Occupational Therapy

## 2014-11-06 ENCOUNTER — Encounter: Payer: Self-pay | Admitting: Physical Therapy

## 2014-11-06 ENCOUNTER — Ambulatory Visit: Payer: Medicare Other | Attending: Physical Medicine & Rehabilitation | Admitting: Physical Therapy

## 2014-11-06 ENCOUNTER — Ambulatory Visit: Payer: Medicare Other | Admitting: Occupational Therapy

## 2014-11-06 ENCOUNTER — Ambulatory Visit: Payer: Medicare Other

## 2014-11-06 DIAGNOSIS — M79602 Pain in left arm: Secondary | ICD-10-CM | POA: Insufficient documentation

## 2014-11-06 DIAGNOSIS — R600 Localized edema: Secondary | ICD-10-CM | POA: Insufficient documentation

## 2014-11-06 DIAGNOSIS — Z7409 Other reduced mobility: Secondary | ICD-10-CM | POA: Diagnosis not present

## 2014-11-06 DIAGNOSIS — R2681 Unsteadiness on feet: Secondary | ICD-10-CM | POA: Insufficient documentation

## 2014-11-06 DIAGNOSIS — R482 Apraxia: Secondary | ICD-10-CM | POA: Insufficient documentation

## 2014-11-06 DIAGNOSIS — M79641 Pain in right hand: Secondary | ICD-10-CM

## 2014-11-06 DIAGNOSIS — M25562 Pain in left knee: Secondary | ICD-10-CM | POA: Insufficient documentation

## 2014-11-06 DIAGNOSIS — R414 Neurologic neglect syndrome: Secondary | ICD-10-CM

## 2014-11-06 DIAGNOSIS — I69951 Hemiplegia and hemiparesis following unspecified cerebrovascular disease affecting right dominant side: Secondary | ICD-10-CM

## 2014-11-06 DIAGNOSIS — M25621 Stiffness of right elbow, not elsewhere classified: Secondary | ICD-10-CM | POA: Insufficient documentation

## 2014-11-06 DIAGNOSIS — R4701 Aphasia: Secondary | ICD-10-CM | POA: Diagnosis not present

## 2014-11-06 DIAGNOSIS — M25511 Pain in right shoulder: Secondary | ICD-10-CM | POA: Insufficient documentation

## 2014-11-06 DIAGNOSIS — M2560 Stiffness of unspecified joint, not elsewhere classified: Secondary | ICD-10-CM

## 2014-11-06 DIAGNOSIS — R4189 Other symptoms and signs involving cognitive functions and awareness: Secondary | ICD-10-CM | POA: Diagnosis not present

## 2014-11-06 DIAGNOSIS — M79605 Pain in left leg: Secondary | ICD-10-CM | POA: Diagnosis not present

## 2014-11-06 DIAGNOSIS — I69351 Hemiplegia and hemiparesis following cerebral infarction affecting right dominant side: Secondary | ICD-10-CM | POA: Insufficient documentation

## 2014-11-06 DIAGNOSIS — M623 Immobility syndrome (paraplegic): Secondary | ICD-10-CM | POA: Diagnosis not present

## 2014-11-06 DIAGNOSIS — R258 Other abnormal involuntary movements: Secondary | ICD-10-CM | POA: Diagnosis not present

## 2014-11-06 DIAGNOSIS — R201 Hypoesthesia of skin: Secondary | ICD-10-CM | POA: Insufficient documentation

## 2014-11-06 NOTE — Patient Instructions (Signed)
Practiced commode sliding board transfers with dtr. Dtr will need additional instruction in sliding board transfers

## 2014-11-06 NOTE — Therapy (Signed)
[facility] [address] [place], [place], [address] Phone: [phone]   Fax:  [phone]  Occupational Therapy Treatment  Patient Details  Name: Beza Steppe MRN: [id number] Date of Birth: 1932/07/31 No Data Recorded  Encounter Date: 11/06/2014      OT End of Session - 11/06/14 1653    Visit Number 10   Number of Visits 16   Date for OT Re-Evaluation 11/13/14   Authorization Type medicare - pt will need G code and progress note every 10th visit   Authorization Time Period G code next visit   OT Start Time 1317   OT Stop Time 1400   OT Time Calculation (min) 43 min   Activity Tolerance Patient tolerated treatment well      Past Medical History  Diagnosis Date  . HYPERTENSION   . HYPERTHYROIDISM   . OBESITY   . ARTHRITIS   . Posttraumatic stress disorder   . INSOMNIA   . MIGRAINE HEADACHE   . Hyperthyroidism     s/p I-131 ablation 03/2011 of multinod goiter  . Arthritis   . Stroke ([place])   . Diverticulosis     Past Surgical History  Procedure Laterality Date  . Abdominal hysterectomy  1976  . Total hip arthroplasty  1998     right  . Tonsillectomy    . Breast surgery      biopsy  . Radiology with anesthesia Left 03/08/2014    Procedure: RADIOLOGY WITH ANESTHESIA;  Surgeon: Rob Hickman, MD;  Location: [facility];  Service: Radiology;  Laterality: Left;    There were no vitals filed for this visit.  Visit Diagnosis:  Pain of right hand - Plan: Ot plan of care cert/re-cert  Impaired cognition - Plan: Ot plan of care cert/re-cert  Hemiplegia affecting right side in right-dominant patient as late effect of cerebrovascular disease ([place]) - Plan: Ot plan of care cert/re-cert  Right-sided visual neglect - Plan: Ot plan of care cert/re-cert      Subjective Assessment - 11/06/14 1318    Subjective  Pt smiling and in no apparent distress during session   Patient is accompained by: Family member   dtr   Pertinent History see epic snapshot,    Patient Stated Goals Pt unable to state due to global aphasia;  dtr states to improve in any way she can especially mobility, reduce pain   Currently in Pain? No/denies                      OT Treatments/Exercises (OP) - 11/06/14 0001    ADLs   Functional Mobility Practiced sliding board transfers mat to drop arm commode.  Pt able to complete with max a to R and mod a to left. Had dtr practice transfer - dtr will need additional instruction. Disussed use of drop arm commode with sliding board vs. steady with standard commode as dtr states pt's only has standard commode at home. Also discussed potential use of padded transfer tub bench to use a commode and shower bench combined. Dtr states that bathroom door is not wide enough. Discussed potential of widening door and dtr stated she may consider this. Will provide dtr with information, pros and cons of alternate pieces of equipment so dtr can make a decision when she feels the time is right. Dtr in agreement. Still awaiting desk length adjustable arm rests - dtr state [employer] is hoping to deliver later this week. Pt able to complete mat  to wheelchair sliding board transfers to the left with light min assist of 1.                  OT Short Term Goals - 11/06/14 1651    OT SHORT TERM GOAL #1   Title Pt and dtr will be mod I with HEP - 10/16/2014   Status Achieved   OT SHORT TERM GOAL #2   Title Pt will tolerate UE positioning in wheelchair and bed to protect shoulder girdle, reduce edema and reduce pain - 12/04/2014   Status On-going  awating desk length arm rests for wheelchair   OT [place] #3   Title Pt's dtr will be I with retrograde massage to RUE   Status Achieved   OT SHORT TERM GOAL #4   Title Pt will be able to sit EOM wth supervision only   Status Achieved   OT SHORT TERM GOAL #5   Title Pt and dtr will be mod I with splint wear and care   Status  Achieved           OT Long Term Goals - 11/06/14 1651    OT LONG TERM GOAL #1   Title Pt and dtr will be mod I with upgraded HEP prn - 11/13/2014   Status Achieved   OT LONG TERM GOAL #2   Title Pt will be able to wash face with min a   Status Achieved   OT LONG TERM GOAL #3   Title Pt will be able to eat at least 50% of her meal with utensil and min a - 12/04/2014   Status On-going   OT LONG TERM GOAL #4   Title Pt will require mod a to transfer to drop arm commode   Status On-going               Plan - 11/06/14 1651    Clinical Impression Statement Pt progressing toward goals. Pt with signficant deficits however has improved in RUE care, functional mobility and sitting balance.    Pt will benefit from skilled therapeutic intervention in order to improve on the following deficits (Retired) Decreased activity tolerance;Decreased balance;Decreased cognition;Decreased knowledge of use of DME;Decreased mobility;Decreased range of motion;Decreased safety awareness;Decreased strength;Increased edema;Impaired UE functional use;Impaired tone;Impaired sensation;Impaired flexibility;Impaired vision/preception;Pain   Rehab Potential Fair   Clinical Impairments Affecting Rehab Potential severity of deficits   OT Frequency 2x / week   OT Duration 8 weeks   OT Treatment/Interventions Self-care/ADL training;Ultrasound;Moist Heat;Contrast Bath;DME and/or AE instruction;Neuromuscular education;Therapeutic exercise;Functional Mobility Training;Manual Therapy;Passive range of motion;Splinting;Therapeutic activities;Balance training;Patient/family education;Visual/perceptual remediation/compensation;Cognitive remediation/compensation   Plan UE positioning when arm rest and lap tray available, transfers with dtr education   Consulted and Agree with Plan of Care Patient;Family member/caregiver   Family Member Consulted dtr     Pt was initially placed on hold right after evaluation per dtr's  request for 2 weeks therefore will renew pt to allow full 16 visits to fully address goals. Pt and dtr in agreement.    Problem List Patient Active Problem List   Diagnosis Date Noted  . Primary osteoarthritis of left knee 10/29/2014  . Urinary tract infectious disease 08/21/2014  . Primary osteoarthritis involving multiple joints 07/26/2014  . Pernicious anemia 07/26/2014  . Low TSH level 07/26/2014  . Hearing loss 07/26/2014  . Decreased vision in both eyes 07/26/2014  . Encounter for therapeutic drug monitoring 07/17/2014  . Spastic hemiparesis affecting dominant side ([place]) 05/31/2014  . Acute pulmonary  embolism ([place]) 03/14/2014  . DVT of lower extremity, bilateral ([place]) 03/14/2014  . Global aphasia 03/14/2014  . 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 Dysphagia due to recent stroke 03/14/2014  . Aphasia due to stroke 03/13/2014  . Right hemiparesis ([place]) 03/13/2014  . Embolic stroke ([place])   . Cerebral infarction due to embolism of left middle cerebral artery ([place])   . Cerebral infarction due to unspecified mechanism   . Cerebral infarction due to thrombosis of right middle cerebral artery ([place])   . Stroke ([place]) 03/08/2014  . Pulmonary embolism ([place]) 03/08/2014  . CVA (cerebral infarction)   . Right sided weakness   . Back pain 08/29/2013  . Primary localized osteoarthrosis, lower leg 03/06/2013  . IBS (irritable bowel syndrome)   . 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 Multinodular goiter 01/13/2011  . Abnormal chest x-ray 12/12/2010  . HYPERTHYROIDISM 11/11/2009  . Insomnia 07/03/2009  . POSTTRAUMATIC STRESS DISORDER 08/22/2008  . OBESITY 04/21/2008  . Osteoarthritis 04/21/2008  . MIGRAINE HEADACHE 04/20/2008  . Essential hypertension 04/20/2008   Occupational Therapy Progress Note  Dates of Reporting Period: 09/18/2014 to 11/06/2014  Objective Reports of Subjective Statement: Pt is severely globally aphasic however pt has been very willing to work with therapists and  is making progress in terms of RUE care, functional mobility and sitting balance.   Objective Measurements: Pt initially presented with severe edema in RUE as well as severe pain - pt no longer displays pain and tolerates UE HEP with dtr. Pt also no longer has any edema in RUE.  Pt has progressed with sliding board transfers (mod -max a x1 to right and min a x1 to left). Pt is now distant supervision for sitting balance   Goal Update: See above status for goals. Pt was placed on hold for two weeks following evaluation per dtr's request so have submitted renewal in order to fully address pt's goals which were based on 16 visits.   Plan: see above  Reason Skilled Services are Required: Will need to continue to address RUE positioning in wheelchair as well as sliding board transfers and education for dtr on ADL's and transfers.   Quay Burow, OTR/L 11/06/2014, 4:58 PM  [facility] [address] [place], [place], [address] Phone: [phone]   Fax:  [phone]  Name: Chavela Justiniano MRN: [id number] Date of Birth: [birth year]

## 2014-11-06 NOTE — Therapy (Signed)
[facility] [address] [employer] [place], [place], [address] Phone: [phone]   Fax:  [phone]  Physical Therapy Treatment  Patient Details  Name: Sandy Barrett MRN: [id number] Date of Birth: 1932-07-25 No Data Recorded  Encounter Date: 11/06/2014      PT End of Session - 11/06/14 1240    Visit Number 9   Number of Visits 17   Date for PT Re-Evaluation 11/16/14   Authorization Type Medicare G-Code every 10th visit   PT Start Time 1231   PT Stop Time 1313   PT Time Calculation (min) 42 min   Equipment Utilized During Treatment Gait belt   Activity Tolerance Patient tolerated treatment well;Patient limited by pain   Behavior During Therapy [facility] for tasks assessed/performed      Past Medical History  Diagnosis Date  . HYPERTENSION   . HYPERTHYROIDISM   . OBESITY   . ARTHRITIS   . Posttraumatic stress disorder   . INSOMNIA   . MIGRAINE HEADACHE   . Hyperthyroidism     s/p I-131 ablation 03/2011 of multinod goiter  . Arthritis   . Stroke ([place])   . Diverticulosis     Past Surgical History  Procedure Laterality Date  . Abdominal hysterectomy  1976  . Total hip arthroplasty  1998     right  . Tonsillectomy    . Breast surgery      biopsy  . Radiology with anesthesia Left 03/08/2014    Procedure: RADIOLOGY WITH ANESTHESIA;  Surgeon: Rob Hickman, MD;  Location: [facility];  Service: Radiology;  Laterality: Left;    There were no vitals filed for this visit.  Visit Diagnosis:  Unsteadiness  Decreased transfer ability  Hemiparesis affecting right side as late effect of cerebrovascular accident ([place])  Stiffness due to immobility  Lower limb pain, diffuse, left      Subjective Assessment - 11/06/14 1239    Subjective No falls reported by caregiver. Still with right UE pain per daughter, however has not been complaining of left leg pain. Was approved for SCAT yesterday.   Currently in Pain? No/denies    Pain Score 0-No pain     Treatment: At parallel bars: Sit<>stand with 2 person mod<>max assist. Cues on technique with pt using left arm to assist with pulling up. Right knee blocked in case of buckling. Performed x 3 reps. Focused on up right posture, midline position each time. On last rep had pt use left arm to reach in all directions, move and stack cones for emphasis on equal leg weight bearing, then progressing to weight shifting without UE support. Pt needed min<>mod assist for standing balance each time, increased time for processing and multimodal cues.  Slide board transfer wheelchair>mat table with max assist. Cues to pt on use of left leg/arm and for head position with the transfers.   Seated on green air disc at edge of mat: Anterior/posterior weight shifting, lateral weight shifting with emphasis on tall posture Balloon batting with left arm x 3-4 minutes total. Mod multimodal cues for technique, weight shifting and posture. With 4 small cones: retrieving from stool starting left lateral and working towards right lateral side, slowly progressing this way with pt reaching to retrieve cones at multiple stops along the way. Min guard assist to min assist for balance and return to midline position. Occasional assist needed to guide the left arm and for weight shift with reaching.        PT Short Term Goals -  10/23/14 1230    PT SHORT TERM GOAL #1   Title Family demonstrates updated, initial HEP correctly with minimal cues. (Target Date: 10/18/2014)   Baseline MET 10/23/2014 with HEP instructed to date.   Time 4   Period Weeks   Status Achieved   PT SHORT TERM GOAL #2   Title Sliding Board Transfers w/c to level surface with maximal assist.  (Target Date: 10/18/2014)   Baseline MET 10/23/2014   Time 4   Period Weeks   Status Achieved   PT SHORT TERM GOAL #3   Title Patient performs sitting balance at edge of mat reaching 2" anteriorly & left and across midline with left UE  with contact assist.  (Target Date: 10/18/2014)   Baseline MET 10/23/2014   Time 4   Period Weeks   Status Achieved           PT Long Term Goals - 10/23/14 1230    PT LONG TERM GOAL #1   Title Family demonstrates updated HEP and verbalizes benefits for decreasing LE pain associated with stiffness.  (Target Date: 11/16/2014)   Time 8   Period Weeks   Status On-going   PT LONG TERM GOAL #2   Title Family performs car transfer with patient with sliding board safely.  (Target Date: 11/16/2014)   Time 8   Period Weeks   Status On-going   PT LONG TERM GOAL #3   Title Patient able to reach 5" anteriorly, left & across midline with left UE sitting at edge of mat or w/c with feet on floor with supervision.  (Target Date: 11/16/2014)   Time 8   Period Weeks   Status On-going   PT LONG TERM GOAL #4   Title Patient able to roll left & right with rail and boost buttocks in bed to aide with self-care with cues only.  (Target Date: 11/16/2014)   Time 8   Period Weeks   Status On-going            Plan - 11/06/14 1240    Clinical Impression Statement Pt was able to stand multiple reps today with assistance with minimal reports of pain. No issues with advancement of  balance activites as well. Progressing toward goals   Pt will benefit from skilled therapeutic intervention in order to improve on the following deficits Abnormal gait;Decreased activity tolerance;Decreased balance;Decreased mobility;Decreased range of motion;Decreased strength;Pain   Rehab Potential Good   PT Frequency 2x / week   PT Duration 8 weeks   PT Treatment/Interventions ADLs/Self Care Home Management;DME Instruction;Functional mobility training;Therapeutic activities;Therapeutic exercise;Balance training;Neuromuscular re-education;Patient/family education;Wheelchair mobility training   PT Next Visit Plan continue to work on seated balance, slide board transfers, bilateral lower extremity strengthening, standing at  parallel bars   Consulted and Agree with Plan of Care Patient;Family member/caregiver   Family Member Consulted dtr, [name]        Problem List Patient Active Problem List   Diagnosis Date Noted  . Primary osteoarthritis of left knee 10/29/2014  . Urinary tract infectious disease 08/21/2014  . Primary osteoarthritis involving multiple joints 07/26/2014  . Pernicious anemia 07/26/2014  . Low TSH level 07/26/2014  . Hearing loss 07/26/2014  . Decreased vision in both eyes 07/26/2014  . Encounter for therapeutic drug monitoring 07/17/2014  . Spastic hemiparesis affecting dominant side ([place]) 05/31/2014  . Acute pulmonary embolism ([place]) 03/14/2014  . DVT of lower extremity, bilateral ([place]) 03/14/2014  . Global aphasia 03/14/2014  . 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  Dysphagia due to recent stroke 03/14/2014  . Aphasia due to stroke 03/13/2014  . Right hemiparesis ([place]) 03/13/2014  . Embolic stroke ([place])   . Cerebral infarction due to embolism of left middle cerebral artery ([place])   . Cerebral infarction due to unspecified mechanism   . Cerebral infarction due to thrombosis of right middle cerebral artery ([place])   . Stroke ([place]) 03/08/2014  . Pulmonary embolism ([place]) 03/08/2014  . CVA (cerebral infarction)   . Right sided weakness   . Back pain 08/29/2013  . Primary localized osteoarthrosis, lower leg 03/06/2013  . IBS (irritable bowel syndrome)   . 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 Multinodular goiter 01/13/2011  . Abnormal chest x-ray 12/12/2010  . HYPERTHYROIDISM 11/11/2009  . Insomnia 07/03/2009  . POSTTRAUMATIC STRESS DISORDER 08/22/2008  . OBESITY 04/21/2008  . Osteoarthritis 04/21/2008  . MIGRAINE HEADACHE 04/20/2008  . Essential hypertension 04/20/2008    Willow Ora 11/07/2014, 9:57 AM  Willow Ora, PTA, [facility] [address] [place], [place] [address] [phone] 11/07/2014, 11:39 AM   Name: Sandy Barrett MRN:  [id number] Date of Birth: [birth year]

## 2014-11-07 ENCOUNTER — Ambulatory Visit: Payer: Medicare Other

## 2014-11-07 ENCOUNTER — Ambulatory Visit (INDEPENDENT_AMBULATORY_CARE_PROVIDER_SITE_OTHER): Payer: Medicare Other | Admitting: General Practice

## 2014-11-07 DIAGNOSIS — Z5181 Encounter for therapeutic drug level monitoring: Secondary | ICD-10-CM

## 2014-11-07 DIAGNOSIS — I82403 Acute embolism and thrombosis of unspecified deep veins of lower extremity, bilateral: Secondary | ICD-10-CM

## 2014-11-07 DIAGNOSIS — I63311 Cerebral infarction due to thrombosis of right middle cerebral artery: Secondary | ICD-10-CM | POA: Diagnosis not present

## 2014-11-07 DIAGNOSIS — I639 Cerebral infarction, unspecified: Secondary | ICD-10-CM | POA: Diagnosis not present

## 2014-11-07 DIAGNOSIS — I2699 Other pulmonary embolism without acute cor pulmonale: Secondary | ICD-10-CM

## 2014-11-07 LAB — POCT INR: INR: 2

## 2014-11-07 NOTE — Progress Notes (Signed)
Pre visit review using our clinic review tool, if applicable. No additional management support is needed unless otherwise documented below in the visit note. 

## 2014-11-07 NOTE — Progress Notes (Signed)
I have reviewed and agree with the plan. 

## 2014-11-08 ENCOUNTER — Encounter: Payer: Self-pay | Admitting: Physical Therapy

## 2014-11-08 ENCOUNTER — Ambulatory Visit: Payer: Medicare Other

## 2014-11-08 ENCOUNTER — Ambulatory Visit: Payer: Medicare Other | Admitting: Physical Therapy

## 2014-11-08 ENCOUNTER — Encounter: Payer: Self-pay | Admitting: Occupational Therapy

## 2014-11-08 ENCOUNTER — Ambulatory Visit: Payer: Medicare Other | Admitting: Occupational Therapy

## 2014-11-08 DIAGNOSIS — M623 Immobility syndrome (paraplegic): Secondary | ICD-10-CM | POA: Diagnosis not present

## 2014-11-08 DIAGNOSIS — R2681 Unsteadiness on feet: Secondary | ICD-10-CM

## 2014-11-08 DIAGNOSIS — Z7409 Other reduced mobility: Secondary | ICD-10-CM

## 2014-11-08 DIAGNOSIS — M79602 Pain in left arm: Secondary | ICD-10-CM | POA: Diagnosis not present

## 2014-11-08 DIAGNOSIS — M79605 Pain in left leg: Secondary | ICD-10-CM | POA: Diagnosis not present

## 2014-11-08 DIAGNOSIS — R414 Neurologic neglect syndrome: Secondary | ICD-10-CM

## 2014-11-08 DIAGNOSIS — I69951 Hemiplegia and hemiparesis following unspecified cerebrovascular disease affecting right dominant side: Secondary | ICD-10-CM

## 2014-11-08 DIAGNOSIS — R4701 Aphasia: Secondary | ICD-10-CM

## 2014-11-08 DIAGNOSIS — R4189 Other symptoms and signs involving cognitive functions and awareness: Secondary | ICD-10-CM

## 2014-11-08 DIAGNOSIS — M79641 Pain in right hand: Secondary | ICD-10-CM

## 2014-11-08 DIAGNOSIS — I69351 Hemiplegia and hemiparesis following cerebral infarction affecting right dominant side: Secondary | ICD-10-CM

## 2014-11-08 DIAGNOSIS — IMO0002 Reserved for concepts with insufficient information to code with codable children: Secondary | ICD-10-CM

## 2014-11-08 DIAGNOSIS — M2560 Stiffness of unspecified joint, not elsewhere classified: Secondary | ICD-10-CM

## 2014-11-08 NOTE — Therapy (Signed)
[facility] [address] [place], [place], [address] Phone: [phone]   Fax:  [phone]  Occupational Therapy Treatment  Patient Details  Name: Sandy Barrett MRN: [id number] Date of Birth: 10-Sep-1932 No Data Recorded  Encounter Date: 11/08/2014      OT End of Session - 11/08/14 1730    Visit Number 11   Number of Visits 16   Date for OT Re-Evaluation 11/13/14   Authorization Type medicare - pt will need G code and progress note every 10th visit   Authorization Time Period G code every 10 visits   OT Start Time 1446   OT Stop Time 1529   OT Time Calculation (min) 43 min   Activity Tolerance Patient tolerated treatment well      Past Medical History  Diagnosis Date  . HYPERTENSION   . HYPERTHYROIDISM   . OBESITY   . ARTHRITIS   . Posttraumatic stress disorder   . INSOMNIA   . MIGRAINE HEADACHE   . Hyperthyroidism     s/p I-131 ablation 03/2011 of multinod goiter  . Arthritis   . Stroke ([place])   . Diverticulosis     Past Surgical History  Procedure Laterality Date  . Abdominal hysterectomy  1976  . Total hip arthroplasty  1998     right  . Tonsillectomy    . Breast surgery      biopsy  . Radiology with anesthesia Left 03/08/2014    Procedure: RADIOLOGY WITH ANESTHESIA;  Surgeon: Rob Hickman, MD;  Location: [facility];  Service: Radiology;  Laterality: Left;    There were no vitals filed for this visit.  Visit Diagnosis:  Hemiplegia affecting right side in right-dominant patient as late effect of cerebrovascular disease ([place])  Pain of right hand  Impaired cognition  Right-sided visual neglect      Subjective Assessment - 11/08/14 1452    Subjective  Pt rubbing R knee and grimacing from time to time   Patient is accompained by: Family member  dtr   Pertinent History see epic snapshot,    Patient Stated Goals Pt unable to state due to global aphasia;  dtr states to improve in any way  she can especially mobility, reduce pain   Currently in Pain? Yes  pt unable to rate but rubbing R knee and grimacing from time to time.                       OT Treatments/Exercises (OP) - 11/08/14 0001    ADLs   Functional Mobility Continue to practice sliding board transfers with dtr who will need additonal practice. Pt requiring min a and encouragement to transfer to the left and mod a to transfer to the right.    Exercises   Exercises Shoulder   Shoulder Exercises: Seated   Other Seated Exercises Demonstrated to dtr HEP to address LUE ROM. See pt instruction section for details. Dtr able to return demonstration after practice and verbal cues. Pt tolerated well and does not appear to have any discomfort during ROM.                 OT Education - 11/08/14 1727    Education provided Yes   Education Details UE HEP, sliding board transfers   [employer]) Educated Patient;Child(ren)   Methods Explanation;Demonstration;Tactile cues;Verbal cues;Handout   Comprehension Verbalized understanding;Returned demonstration;Verbal cues required;Need further instruction  for sliding board transfers - dtr I with UE HEP  OT Short Term Goals - 11/08/14 1728    OT SHORT TERM GOAL #1   Title Pt and dtr will be mod I with HEP - 10/16/2014   Status Achieved   OT SHORT TERM GOAL #2   Title Pt will tolerate UE positioning in wheelchair and bed to protect shoulder girdle, reduce edema and reduce pain - 12/04/2014   Status On-going  awating desk length arm rests for wheelchair   OT [place] #3   Title Pt's dtr will be I with retrograde massage to RUE   Status Achieved   OT SHORT TERM GOAL #4   Title Pt will be able to sit EOM wth supervision only   Status Achieved   OT SHORT TERM GOAL #5   Title Pt and dtr will be mod I with splint wear and care   Status Achieved           OT Long Term Goals - 11/08/14 1728    OT LONG TERM GOAL #1   Title Pt and dtr will  be mod I with upgraded HEP prn - 11/13/2014   Status Achieved   OT LONG TERM GOAL #2   Title Pt will be able to wash face with min a   Status Achieved   OT LONG TERM GOAL #3   Title Pt will be able to eat at least 50% of her meal with utensil and min a - 12/04/2014   Status Achieved   OT LONG TERM GOAL #4   Title Pt will require mod a to transfer to drop arm commode   Status Achieved               Plan - 11/08/14 1728    Clinical Impression Statement Pt and dtr progressing toward goals. Pt to be placed on hold until 11/27/2014 as dtr is awaiting full length adjustable arm rests in order to use half lap tray. At that time will also practice sliding board transfers again with dtr.    Pt will benefit from skilled therapeutic intervention in order to improve on the following deficits (Retired) Decreased activity tolerance;Decreased balance;Decreased cognition;Decreased knowledge of use of DME;Decreased mobility;Decreased range of motion;Decreased safety awareness;Decreased strength;Increased edema;Impaired UE functional use;Impaired tone;Impaired sensation;Impaired flexibility;Impaired vision/preception;Pain   Rehab Potential Fair   Clinical Impairments Affecting Rehab Potential severity of deficits   OT Frequency 2x / week   OT Duration 8 weeks   OT Treatment/Interventions Self-care/ADL training;Ultrasound;Moist Heat;Contrast Bath;DME and/or AE instruction;Neuromuscular education;Therapeutic exercise;Functional Mobility Training;Manual Therapy;Passive range of motion;Splinting;Therapeutic activities;Balance training;Patient/family education;Visual/perceptual remediation/compensation;Cognitive remediation/compensation   Plan placed on hold until 11/27/2014 to await arm rests  -complete positioning, education for dtr on sliding board transfers   Consulted and Agree with Plan of Care Patient;Family member/caregiver   Family Member Consulted dtr        Problem List Patient Active  Problem List   Diagnosis Date Noted  . Primary osteoarthritis of left knee 10/29/2014  . Urinary tract infectious disease 08/21/2014  . Primary osteoarthritis involving multiple joints 07/26/2014  . Pernicious anemia 07/26/2014  . Low TSH level 07/26/2014  . Hearing loss 07/26/2014  . Decreased vision in both eyes 07/26/2014  . Encounter for therapeutic drug monitoring 07/17/2014  . Spastic hemiparesis affecting dominant side ([place]) 05/31/2014  . Acute pulmonary embolism ([place]) 03/14/2014  . DVT of lower extremity, bilateral ([place]) 03/14/2014  . Global aphasia 03/14/2014  . 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 Dysphagia due to recent stroke 03/14/2014  . Aphasia  due to stroke 03/13/2014  . Right hemiparesis ([place]) 03/13/2014  . Embolic stroke ([place])   . Cerebral infarction due to embolism of left middle cerebral artery ([place])   . Cerebral infarction due to unspecified mechanism   . Cerebral infarction due to thrombosis of right middle cerebral artery ([place])   . Stroke ([place]) 03/08/2014  . Pulmonary embolism ([place]) 03/08/2014  . CVA (cerebral infarction)   . Right sided weakness   . Back pain 08/29/2013  . Primary localized osteoarthrosis, lower leg 03/06/2013  . IBS (irritable bowel syndrome)   . 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 Multinodular goiter 01/13/2011  . Abnormal chest x-ray 12/12/2010  . HYPERTHYROIDISM 11/11/2009  . Insomnia 07/03/2009  . POSTTRAUMATIC STRESS DISORDER 08/22/2008  . OBESITY 04/21/2008  . Osteoarthritis 04/21/2008  . MIGRAINE HEADACHE 04/20/2008  . Essential hypertension 04/20/2008    Quay Burow, OTR/L 11/08/2014, 5:32 PM  [facility] [address] [place], [place], [address] Phone: [phone]   Fax:  [phone]  Name: Sandy Barrett MRN: [id number] Date of Birth: 07-03-1932

## 2014-11-08 NOTE — Therapy (Signed)
[facility] [address] [place], [place], [address] Phone: [phone]   Fax:  [phone]  Physical Therapy Treatment  Patient Details  Name: Sandy Barrett MRN: [id number] Date of Birth: May 22, 1932 No Data Recorded  Encounter Date: 11/08/2014      PT End of Session - 11/08/14 1621    Visit Number 10   Number of Visits 17   Date for PT Re-Evaluation 11/16/14   Authorization Type Medicare G-Code every 10th visit   PT Start Time 1400   PT Stop Time 1445   PT Time Calculation (min) 45 min   Equipment Utilized During Treatment Gait belt   Activity Tolerance Patient limited by pain   Behavior During Therapy [facility] for tasks assessed/performed      Past Medical History  Diagnosis Date  . HYPERTENSION   . HYPERTHYROIDISM   . OBESITY   . ARTHRITIS   . Posttraumatic stress disorder   . INSOMNIA   . MIGRAINE HEADACHE   . Hyperthyroidism     s/p I-131 ablation 03/2011 of multinod goiter  . Arthritis   . Stroke ([place])   . Diverticulosis     Past Surgical History  Procedure Laterality Date  . Abdominal hysterectomy  1976  . Total hip arthroplasty  1998     right  . Tonsillectomy    . Breast surgery      biopsy  . Radiology with anesthesia Left 03/08/2014    Procedure: RADIOLOGY WITH ANESTHESIA;  Surgeon: Rob Hickman, MD;  Location: [facility];  Service: Radiology;  Laterality: Left;    There were no vitals filed for this visit.  Visit Diagnosis:  Hemiplegia affecting right side in right-dominant patient as late effect of cerebrovascular disease ([place])  Unsteadiness  Decreased transfer ability  Hemiparesis affecting right side as late effect of cerebrovascular accident ([place])  Stiffness due to immobility      Subjective Assessment - 11/08/14 1606    Subjective No new complaints. Pt. unable to rate pain. Daughter rates 5/10 constand pain in bilateral knees. Daughter was curious if using the steady at  home for bilateral weightbearing would be beneficial to the pt. PTA advised daughter to bring in a picture of the steady to be able to make sound judgement.    Patient is accompained by: Family member   Patient Stated Goals Her daughter reports would like to improve her strength and ability to move around the house.    Currently in Pain? Yes   Pain Score 5    Pain Location Knee   Pain Orientation Left;Right   Pain Descriptors / Indicators Aching   Pain Type Chronic pain   Pain Onset More than a month ago   Pain Frequency Constant   Aggravating Factors  weather, and excess knee flexion.     Transfers: Pt. Completed sliding board transfer from [facility] to level mat with max assist. Verbal and tactile cues required for UE placement, pushing with UE's and weight shifting forward. Mat to [facility] transfer was not attempted due to increased pain in the lower extremities (pt with facial grimacing)   Therapeutic exercise: To increase strength of unaffected left side and facilitate actvie muscle contraction of R hemiparetic side. Dynamic sitting balance: Pt. Required mod assist. For reaching to balloon 5 in. Forward, left and cross midline.  Scooting on edge of mat: During sitting balance it was noted that pt. Could actively scoot. Pt. Was then asked to scoot from side to side on edge  of mat. Pt required max assist with verbal and tactile cues to scoot 6 in. To right and left.       PT Education - 11/08/14 1610    Education provided Yes   Education Details Daughter educated about sliding board transfer to mat and future trial into car. Pt. with increased pain in legs during treatment so daughter was unable to attempt after SPTA demonstration.    Person(s) Educated Patient;Child(ren)   Methods Explanation;Demonstration;Tactile cues;Verbal cues   Comprehension Verbalized understanding          PT Short Term Goals - 11/08/14 1615    PT SHORT TERM GOAL #1   Title Family demonstrates updated, initial HEP  correctly with minimal cues. (Target Date: 10/18/2014)   Baseline MET 10/23/2014 with HEP instructed to date.   Time 4   Period Weeks   Status Achieved   PT SHORT TERM GOAL #2   Title Sliding Board Transfers w/c to level surface with maximal assist.  (Target Date: 10/18/2014)   Baseline MET 10/23/2014   Time 4   Period Weeks   Status Achieved   PT SHORT TERM GOAL #3   Title Patient performs sitting balance at edge of mat reaching 2" anteriorly & left and across midline with left UE with contact assist.  (Target Date: 10/18/2014)   Baseline MET 10/23/2014   Time 4   Period Weeks   Status Achieved           PT Long Term Goals - 11/08/14 1619    PT LONG TERM GOAL #1   Title Family demonstrates updated HEP and verbalizes benefits for decreasing LE pain associated with stiffness.  (Target Date: 11/16/2014)   Time 8   Period Weeks   Status On-going   PT LONG TERM GOAL #2   Title Family performs car transfer with patient with sliding board safely.  (Target Date: 11/16/2014)   Time 8   Period Weeks   Status On-going   PT LONG TERM GOAL #3   Title Patient able to reach 5" anteriorly, left & across midline with left UE sitting at edge of mat or w/c with feet on floor with supervision.  (Target Date: 11/16/2014)   Time 8   Period Weeks   Status On-going   PT LONG TERM GOAL #4   Title Patient able to roll left & right with rail and boost buttocks in bed to aide with self-care with cues only.  (Target Date: 11/16/2014)   Time 8   Period Weeks   Status On-going           Plan - 11/08/14 1606    Clinical Impression Statement Pt. is limited by fatigue and hemiparetic weakness. Pt. still making slow progress toward mod assist level, but continues to present max assist to total assist with multimodal cues. Inconsistent with OT/PT sessions as well, as pt does more of the transfer during OT sessions, needing min assist then.   Pt will benefit from skilled therapeutic intervention in  order to improve on the following deficits Abnormal gait;Decreased activity tolerance;Decreased balance;Decreased mobility;Decreased range of motion;Decreased strength;Pain   Rehab Potential Good   PT Frequency 2x / week   PT Duration 8 weeks   PT Treatment/Interventions ADLs/Self Care Home Management;DME Instruction;Functional mobility training;Therapeutic activities;Therapeutic exercise;Balance training;Neuromuscular re-education;Patient/family education;Wheelchair mobility training   PT Next Visit Plan continue to work on seated balance, slide board transfers, bilateral lower extremity strengthening, standing at parallel bars. Assess LTG's.   Consulted and Agree with  Plan of Care Patient;Family member/caregiver   Family Member Consulted Lucy Antigua Evlyn Kanner - Nov 09, 2014 1624    Functional Assessment Tool Used Max Assist transfers with sliding board w/c to level mat.      Problem List Patient Active Problem List   Diagnosis Date Noted  . Primary osteoarthritis of left knee 10/29/2014  . Urinary tract infectious disease 08/21/2014  . Primary osteoarthritis involving multiple joints 07/26/2014  . Pernicious anemia 07/26/2014  . Low TSH level 07/26/2014  . Hearing loss 07/26/2014  . Decreased vision in both eyes 07/26/2014  . Encounter for therapeutic drug monitoring 07/17/2014  . Spastic hemiparesis affecting dominant side ([place]) 05/31/2014  . Acute pulmonary embolism ([place]) 03/14/2014  . DVT of lower extremity, bilateral ([place]) 03/14/2014  . Global aphasia 03/14/2014  . 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 Dysphagia due to recent stroke 03/14/2014  . Aphasia due to stroke 03/13/2014  . Right hemiparesis ([place]) 03/13/2014  . Embolic stroke ([place])   . Cerebral infarction due to embolism of left middle cerebral artery ([place])   . Cerebral infarction due to unspecified mechanism   . Cerebral infarction due to thrombosis of right middle cerebral artery ([place])   . Stroke ([place])  03/08/2014  . Pulmonary embolism ([place]) 03/08/2014  . CVA (cerebral infarction)   . Right sided weakness   . Back pain 08/29/2013  . Primary localized osteoarthrosis, lower leg 03/06/2013  . IBS (irritable bowel syndrome)   . 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 Multinodular goiter 01/13/2011  . Abnormal chest x-ray 12/12/2010  . HYPERTHYROIDISM 11/11/2009  . Insomnia 07/03/2009  . POSTTRAUMATIC STRESS DISORDER 08/22/2008  . OBESITY 04/21/2008  . Osteoarthritis 04/21/2008  . MIGRAINE HEADACHE 04/20/2008  . Essential hypertension 04/20/2008    Laney Potash 2014/11/09, 4:31 PM  Laney Potash, [place]  Name: Sandy Barrett MRN: [id number] Date of Birth: 1932/03/28  This note has been reviewed and edited by supervision CI.  Willow Ora, PTA, [facility] [address], [place], [place] [address] [phone] 11/09/2014, 9:23 AM

## 2014-11-08 NOTE — Patient Instructions (Addendum)
  Things to do with [name]: (spend about 20 minutes, twice a day, at least 4-5 days per week) Everything you do that is verbal, sit in front of her  1) Get out family pictures of two to four people and have her say the names together with you  2) In the picture, have her point out the person you name.   3) Get common items out of the frig, the bathroom, the kitchen cabinets and drawers and set three in front of her - see if she can identify the one you name.  4) Have her say the name together with you, of that thing in number two  5) Ask her very simple questions about your family or the room you are in, and have her answer with her "yes/no" board  (Examples: "Is Rose your daughter?", "Is there a TV in the room?"). If she does not answer distinctly, take her hand and touch "yes" or "no" on the board, whichever is appropriate to answer the question.  6) Say the days of the week, count 1-20, recite nursery rhymes - you and she say them together   7) Sing songs she would have memorized together with her - Jesus Loves Me, Amazing Shirlee L[place], Happy Birthday, other hymns or popular songs she would have memorized

## 2014-11-08 NOTE — Therapy (Unsigned)
[facility] [address] [place], [place], [address] Phone: [phone]   Fax:  [phone]  Patient Details  Name: Sandy Barrett MRN: [id number] Date of Birth: [birth year] Referring Provider:  No ref. provider found  Encounter Date: 11/08/2014   Willow Ora 11/08/2014, 12:22 PM  [facility] [address] [place], [place], [address] Phone: [phone]   Fax:  [phone]   Patient Care Conference:  Therapy Team Members present (names below): PT/PTA:  Jamey Reas, [place], PTA OT: Forde Radon, OT ST: Garald Balding, ST, Lillie Columbia, ST   Therapy Focus Areas PT: see eval and notes    OT: see eval and notes     ST: see eval and notes    Positive Prognostic Indicators:  Family support    Barriers to Progress:  Severity of deficits    Education Provided:  Home tasks for speech and language Transfer with slide boards HEP for strengthening, range of motion and stretching Yes/no communication board   Anticipated Outcomes:  24 hour assistance    Follow Up Services:  none    Other relevant information discussed:   Willow Ora, PTA, [facility] [address] [place], [place] [address] [phone] 11/08/2014, 12:26 PM

## 2014-11-08 NOTE — Patient Instructions (Signed)
Exercise program for right arm at home: Do 1-2 times per day. Do slow gentle movements and never pull on the arm.  Always respect her pain limitation.  1. Sit next to [name] on her right side. Using your left arm, cradle her right arm in yours taking the weight off.  Your left arm should support from shoulder to forearm.  Use your right hand to support her right hand. Gently raise arm up to about shoulder level - DO NOT GO ANY HIGHER.  Then slowly return to starting position. Do 10 in the morning and 10 at night.  2. Sit next [name] on her right side and use your left arm to cradle her right arm. Allow the elbow to bend. Support the entire arm and hand.  Slowly raise her arm out to the side until her elbow is about even with her shoulder.  Then return to starting position.  Do 10 in the morning and 10 at night.  3. Sit next to [name] on her right side and use the hold from #2.  Slowly straighten and bend the elbow.  Do 10 times in the morning and 10 times as night.   You know how to do her wrist and hand.

## 2014-11-08 NOTE — Therapy (Signed)
[facility] [address] [place], [place], [address] Phone: [phone]   Fax:  [phone]  Speech Language Pathology Treatment  Patient Details  Name: Sandy Barrett MRN: [id number] Date of Birth: 11/04/1932 No Data Recorded  Encounter Date: 11/08/2014      End of Session - 11/08/14 1358    Visit Number 7   Number of Visits 25   Date for SLP Re-Evaluation 12/07/14   SLP Start Time 1319   SLP Stop Time  1400   SLP Time Calculation (min) 41 min   Activity Tolerance Patient tolerated treatment well      Past Medical History  Diagnosis Date  . HYPERTENSION   . HYPERTHYROIDISM   . OBESITY   . ARTHRITIS   . Posttraumatic stress disorder   . INSOMNIA   . MIGRAINE HEADACHE   . Hyperthyroidism     s/p I-131 ablation 03/2011 of multinod goiter  . Arthritis   . Stroke ([place])   . Diverticulosis     Past Surgical History  Procedure Laterality Date  . Abdominal hysterectomy  1976  . Total hip arthroplasty  1998     right  . Tonsillectomy    . Breast surgery      biopsy  . Radiology with anesthesia Left 03/08/2014    Procedure: RADIOLOGY WITH ANESTHESIA;  Surgeon: Rob Hickman, MD;  Location: [facility];  Service: Radiology;  Laterality: Left;    There were no vitals filed for this visit.  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      Subjective Assessment - 11/08/14 1324    Subjective Multisyllabic gibberish response to SLP "How are you doing?"   Patient is accompained by: Family member  [name], in hallway               ADULT SLP TREATMENT - 11/08/14 1326    General Information   Behavior/Cognition Alert;Pleasant mood;Cooperative   Treatment Provided   Treatment provided Cognitive-Linquistic   Pain Assessment   Pain Assessment --  no overt signs of pain in the first portion of therapy   Cognitive-Linquistic Treatment   Treatment focused on  Aphasia;Apraxia   Skilled Treatment Singing and rotoe speech simultaneously with SLP with visual cues to attempt rt brain assistance and automatic speech area A with lt brain linguistically - 25%, when SLP faded out pt achieved approx 15% of lyrics. Automatic speech- <10% success with counting, days of week. With yes/no board goals, pt had 0% success - total A.    Assessment / Recommendations / Plan   Plan Continue with current plan of care   Progression Toward Goals   Progression toward goals Not progressing toward goals (comment)          SLP Education - 11/08/14 1408    Education Details home tasks for pt   Person(s) Educated Patient;Child(ren)   Methods Handout;Explanation;Verbal cues   Comprehension Verbalized understanding;Need further instruction          SLP Short Term Goals - 11/08/14 1705    SLP SHORT TERM GOAL #1   Title pt will receptively ID pictures or objects from f:3, given name of picture/object 30%   Time 1   Period Weeks   Status Not Met   SLP SHORT TERM GOAL #2   Title pt will definitively ID yes/no responses via communication board or head movements 40% of the time   Time 1   Period Weeks   Status  Not Met   SLP SHORT TERM GOAL #3   Title pt will imitate consonant-vowel syllables/words with 40% success   Time 1   Period Weeks   Status Not Met   SLP SHORT TERM GOAL #4   Title pt will perform automatic speech tasks independently with average 30% success including counting 1-20, DOW,    Time 1   Period Weeks   Status Not Met          SLP Long Term Goals - 11/08/14 1706    SLP LONG TERM GOAL #1   Title pt will receptively ID pictures/objects from f:3 40% success   Time 4   Period Weeks   Status On-going   SLP LONG TERM GOAL #2   Title pt will perform activities at home with family as reported by family members at least 4 days per week   Time 4   Period Weeks   Status On-going   SLP LONG TERM GOAL #3   Title pt will ID yes/no responses with  head movement or communication board with 50% of the time   Time 4   Period Weeks   Status Revised   SLP LONG TERM GOAL #4   Title pt will answer yes/no questions re: functional information with 90% success   Time 4   Period Weeks   Status On-going   SLP LONG TERM GOAL #5   Title pt will perform automatic speech tasks with average 40% success including DOW and counting 1-20   Time 4   Period Weeks   Status On-going   SLP LONG TERM GOAL #6   Title pt will use multimodal communication to expressively communicate in 70% of opportunities   Time 4   Period Weeks   Status On-going          Plan - 11/08/14 1359    Clinical Impression Statement Deficits which are severe/profound in nature cont, 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 Pt to cont skilled ST to maximize language skills and decr caregiver burden. Pt progress may be beginning to plateau. Discharge possible in next 1-2 weeks.   Speech Therapy Frequency 3x / week  but pt appears to be scheduling x2/week   Duration 4 weeks    Treatment/Interventions Patient/family education;Multimodal communcation approach;Language facilitation;SLP instruction and feedback;Compensatory strategies;Cueing hierarchy;Functional tasks   Potential to Achieve Goals Fair   Potential Considerations Severity of impairments        Problem List Patient Active Problem List   Diagnosis Date Noted  . Primary osteoarthritis of left knee 10/29/2014  . Urinary tract infectious disease 08/21/2014  . Primary osteoarthritis involving multiple joints 07/26/2014  . Pernicious anemia 07/26/2014  . Low TSH level 07/26/2014  . Hearing loss 07/26/2014  . Decreased vision in both eyes 07/26/2014  . Encounter for therapeutic drug monitoring 07/17/2014  . Spastic hemiparesis affecting dominant side ([facility]) 05/31/2014  . Acute pulmonary embolism ([facility]) 03/14/2014  . DVT of lower extremity, bilateral ([facility]) 03/14/2014  . Global aphasia 03/14/2014  . 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 Dysphagia due to recent stroke 03/14/2014  . Aphasia due to stroke 03/13/2014  . Right hemiparesis ([facility]) 03/13/2014  . Embolic stroke ([facility])   . Cerebral infarction due to embolism of left middle cerebral artery ([facility] [facility])   . Cerebral infarction due to unspecified mechanism   . Cerebral infarction due to thrombosis of right middle cerebral artery ([place] [place])   . Stroke ([facility]) 03/08/2014  . Pulmonary embolism ([facility]) 03/08/2014  . CVA (cerebral  infarction)   . Right sided weakness   . Back pain 08/29/2013  . Primary localized osteoarthrosis, lower leg 03/06/2013  . IBS (irritable bowel syndrome)   . 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 Multinodular goiter 01/13/2011  . Abnormal chest x-ray 12/12/2010  . HYPERTHYROIDISM 11/11/2009  . Insomnia 07/03/2009  . POSTTRAUMATIC STRESS DISORDER 08/22/2008  . OBESITY 04/21/2008  . Osteoarthritis 04/21/2008  . MIGRAINE HEADACHE 04/20/2008  . Essential hypertension 04/20/2008    [facility] , [place], [place]  11/08/2014, 5:06 PM  [facility] [address] [place], [place], [address] Phone: [phone]   Fax:  [phone]   Name: Sandy Barrett MRN: [id number] Date of Birth: Jun 01, 1932

## 2014-11-09 ENCOUNTER — Other Ambulatory Visit: Payer: Self-pay

## 2014-11-09 ENCOUNTER — Encounter: Payer: Self-pay | Admitting: Physical Therapy

## 2014-11-09 DIAGNOSIS — I824Y3 Acute embolism and thrombosis of unspecified deep veins of proximal lower extremity, bilateral: Secondary | ICD-10-CM

## 2014-11-09 NOTE — Therapy (Signed)
[facility] [address] [place], [place], [address] Phone: [phone]   Fax:  [phone]  Patient Details  Name: Sandy Barrett MRN: [id number] Date of Birth: [birth year] Referring Provider:  No ref. provider found  Encounter Date: 11/09/2014  Physical Therapy Progress Note  Dates of Reporting Period: 09/18/2014 to 11/08/2014  Objective Reports of Subjective Statement: Daughter reports family still using Nelson lift for all transfers. They are assisting patient with HEP which has improved leg pain at times. Patient still limits weight bearing with left LE with non-verbal expressions of pain.   Objective Measurements: Sliding board transfers vary from MaxA to [place] depending on pain, patient understanding.  Goal Update:      PT Short Term Goals - 11/08/14 1615    PT SHORT TERM GOAL #1   Title Family demonstrates updated, initial HEP correctly with minimal cues. (Target Date: 10/18/2014)   Baseline MET 10/23/2014 with HEP instructed to date.   Time 4   Period Weeks   Status Achieved   PT SHORT TERM GOAL #2   Title Sliding Board Transfers w/c to level surface with maximal assist.  (Target Date: 10/18/2014)   Baseline MET 10/23/2014   Time 4   Period Weeks   Status Achieved   PT SHORT TERM GOAL #3   Title Patient performs sitting balance at edge of mat reaching 2" anteriorly & left and across midline with left UE with contact assist.  (Target Date: 10/18/2014)   Baseline MET 10/23/2014   Time 4   Period Weeks   Status Achieved      Plan: continue plan of care   Reason Skilled Services are Required: Patient has hemiplegia with dependency in bed mobility, transfers and family requires education for safe assistance.   Jamey Reas PT, DPT 11/09/2014, 10:37 AM  [facility] [address] [place], [place], [address] Phone: [phone]   Fax:   [phone]

## 2014-11-09 NOTE — Telephone Encounter (Signed)
Incoming fax for requested refill ok to fill.

## 2014-11-12 ENCOUNTER — Other Ambulatory Visit: Payer: Self-pay | Admitting: General Practice

## 2014-11-12 MED ORDER — WARFARIN SODIUM 4 MG PO TABS: ORAL_TABLET | ORAL | Status: DC

## 2014-11-13 ENCOUNTER — Telehealth: Payer: Self-pay | Admitting: General Practice

## 2014-11-13 ENCOUNTER — Telehealth: Payer: Self-pay | Admitting: *Deleted

## 2014-11-13 ENCOUNTER — Other Ambulatory Visit: Payer: Self-pay | Admitting: General Practice

## 2014-11-13 ENCOUNTER — Encounter: Payer: Self-pay | Admitting: Physical Therapy

## 2014-11-13 ENCOUNTER — Ambulatory Visit: Payer: Medicare Other | Admitting: Physical Therapy

## 2014-11-13 ENCOUNTER — Encounter: Payer: Medicare Other | Admitting: Occupational Therapy

## 2014-11-13 ENCOUNTER — Ambulatory Visit: Payer: Medicare Other

## 2014-11-13 DIAGNOSIS — M79602 Pain in left arm: Secondary | ICD-10-CM | POA: Diagnosis not present

## 2014-11-13 DIAGNOSIS — Z7409 Other reduced mobility: Secondary | ICD-10-CM

## 2014-11-13 DIAGNOSIS — I69351 Hemiplegia and hemiparesis following cerebral infarction affecting right dominant side: Secondary | ICD-10-CM | POA: Diagnosis not present

## 2014-11-13 DIAGNOSIS — R2681 Unsteadiness on feet: Secondary | ICD-10-CM | POA: Diagnosis not present

## 2014-11-13 DIAGNOSIS — M79605 Pain in left leg: Secondary | ICD-10-CM | POA: Diagnosis not present

## 2014-11-13 DIAGNOSIS — M2560 Stiffness of unspecified joint, not elsewhere classified: Secondary | ICD-10-CM

## 2014-11-13 DIAGNOSIS — M25562 Pain in left knee: Secondary | ICD-10-CM

## 2014-11-13 DIAGNOSIS — N898 Other specified noninflammatory disorders of vagina: Secondary | ICD-10-CM

## 2014-11-13 DIAGNOSIS — M623 Immobility syndrome (paraplegic): Secondary | ICD-10-CM | POA: Diagnosis not present

## 2014-11-13 MED ORDER — WARFARIN SODIUM 4 MG PO TABS: ORAL_TABLET | ORAL | Status: DC

## 2014-11-13 NOTE — Telephone Encounter (Signed)
LVM informing

## 2014-11-13 NOTE — Telephone Encounter (Signed)
Left message on VM to clarify coumadin dosage.

## 2014-11-13 NOTE — Telephone Encounter (Signed)
Per Sandy Barrett she has spoken with daughter closing encounter...Sandy Barrett

## 2014-11-13 NOTE — Telephone Encounter (Signed)
Spoke with daughter she stated that mom is going to need to need a referral to see GYN. Spoke with dr. Quay Burow at [facility], but at that time the vaginal discharge had stop, and now it has develop back. Since mom is not able to stand long would like to see gynecologist that has a lift chair...[name]

## 2014-11-13 NOTE — Telephone Encounter (Signed)
Let her know referral was ordered

## 2014-11-13 NOTE — Telephone Encounter (Signed)
Receive call from daughter stating she wanting to clarify mom coumadin. Pick up med yesterday & the instructions was change. When she saw cindy last Wed she told her to take 4 mg everyday except on Mon, Wed, and Fri she suppose to take 6 mg. Instructions is the opposite on what she pick up yesterday. Inform daughter will have [name] to give her a call to clarify since she is the one who does the coumadin she should be in around 26. [name] pls call daughter concerning coumadin instructions...[name]

## 2014-11-14 ENCOUNTER — Ambulatory Visit: Payer: Medicare Other

## 2014-11-14 ENCOUNTER — Ambulatory Visit: Payer: Medicare Other | Admitting: Physical Therapy

## 2014-11-14 DIAGNOSIS — Z7409 Other reduced mobility: Secondary | ICD-10-CM

## 2014-11-14 DIAGNOSIS — M79602 Pain in left arm: Secondary | ICD-10-CM | POA: Diagnosis not present

## 2014-11-14 DIAGNOSIS — M623 Immobility syndrome (paraplegic): Secondary | ICD-10-CM | POA: Diagnosis not present

## 2014-11-14 DIAGNOSIS — R4701 Aphasia: Secondary | ICD-10-CM

## 2014-11-14 DIAGNOSIS — I69351 Hemiplegia and hemiparesis following cerebral infarction affecting right dominant side: Secondary | ICD-10-CM

## 2014-11-14 DIAGNOSIS — R2681 Unsteadiness on feet: Secondary | ICD-10-CM | POA: Diagnosis not present

## 2014-11-14 DIAGNOSIS — M79605 Pain in left leg: Secondary | ICD-10-CM | POA: Diagnosis not present

## 2014-11-14 DIAGNOSIS — IMO0002 Reserved for concepts with insufficient information to code with codable children: Secondary | ICD-10-CM

## 2014-11-14 NOTE — Therapy (Signed)
[facility] [address] [place], [place], [address] Phone: [phone]   Fax:  [phone]  Physical Therapy Treatment  Patient Details  Name: Sandy Barrett MRN: [id number] Date of Birth: 1932-01-08 Referring Provider: Alysia Penna, MD  Encounter Date: 11/13/2014      PT End of Session - 11/13/14 1315    Visit Number 11   Number of Visits 17   Date for PT Re-Evaluation 11/16/14   Authorization Type Medicare G-Code every 10th visit   PT Start Time 1230   PT Stop Time 1315   PT Time Calculation (min) 45 min   Equipment Utilized During Treatment Gait belt   Activity Tolerance Patient limited by pain   Behavior During Therapy [facility] for tasks assessed/performed      Past Medical History  Diagnosis Date  . HYPERTENSION   . HYPERTHYROIDISM   . OBESITY   . ARTHRITIS   . Posttraumatic stress disorder   . INSOMNIA   . MIGRAINE HEADACHE   . Hyperthyroidism     s/p I-131 ablation 03/2011 of multinod goiter  . Arthritis   . Stroke ([place])   . Diverticulosis     Past Surgical History  Procedure Laterality Date  . Abdominal hysterectomy  1976  . Total hip arthroplasty  1998     right  . Tonsillectomy    . Breast surgery      biopsy  . Radiology with anesthesia Left 03/08/2014    Procedure: RADIOLOGY WITH ANESTHESIA;  Surgeon: Rob Hickman, MD;  Location: [facility];  Service: Radiology;  Laterality: Left;    There were no vitals filed for this visit.  Visit Diagnosis:  Unsteadiness  Decreased transfer ability  Hemiparesis affecting right side as late effect of cerebrovascular accident ([facility])  Stiffness due to immobility  Lower limb pain, diffuse, left  Knee pain, acute, left      Subjective Assessment - 11/13/14 1230    Subjective Daughter reports still transfering with Harrel Lemon Lift at home. Daughter would like to work on standing more to make cleaning her mother after toileting easier.    Patient  is accompained by: Family member   Currently in Pain? Other (Comment)  aphasia makes number rating difficult            [facility] PT Assessment - 11/13/14 1230    Assessment   Referring Provider Alysia Penna, MD                     [facility] Adult PT Treatment/Exercise - 11/13/14 1230    Transfers   Transfers Sit to Stand;Lateral/Scoot Transfers   Sit to Stand 2: Max assist   Sit to Stand Details (indicate cue type and reason) Pulling on parallel bar, verbal cues on distance placement of w/c from bar, patient c/o left knee pain limiting standing   Lateral/Scoot Transfers 3: Mod assist;4: Min assist;With slide board  modA to left uphill 1" difference, MinA to right downhill   Lateral/Scoot Transfer Details (indicate cue type and reason) PT instructed dtr in proper hand position to facilitate patient ability to assist in transfers. Pt's daughter to measure height of her hospital bed at lowest point.    Balance   Balance Assessed Yes   Dynamic Sitting Balance   Dynamic Sitting - Balance Support No upper extremity supported;Feet supported;During functional activity   Dynamic Sitting - Level of Assistance 5: Stand by assistance   Reach (Patient is able to reach ___ inches to right,  left, forward, back) 5" anteriorly, to left and across midline                PT Education - 11/13/14 1315    Education provided Yes   Education Details daughter's position and how to position to facilitate her mother's assistance; discussed possibility of installing grab bar in bedroom for standing to aid with use of BSC   Person(s) Educated Patient;Child(ren)   Methods Explanation;Demonstration   Comprehension Verbalized understanding;Need further instruction          PT Short Term Goals - 11/13/14 1315    PT SHORT TERM GOAL #1   Title Family demonstrates updated, initial HEP correctly with minimal cues. (Target Date: 10/18/2014)   Baseline MET 10/23/2014 with HEP instructed to  date.   Time 4   Period Weeks   Status Achieved   PT SHORT TERM GOAL #2   Title Sliding Board Transfers w/c to level surface with maximal assist.  (Target Date: 10/18/2014)   Baseline MET 10/23/2014   Time 4   Period Weeks   Status Achieved   PT SHORT TERM GOAL #3   Title Patient performs sitting balance at edge of mat reaching 2" anteriorly & left and across midline with left UE with contact assist.  (Target Date: 10/18/2014)   Baseline MET 10/23/2014   Time 4   Period Weeks   Status Achieved   PT SHORT TERM GOAL #4   Title Daughter demonstrates sliding board transfer with her mother with PT instruction & minA. (Target Date: 12/14/2014)   Time 1   Period Months   Status New   PT SHORT TERM GOAL #5   Title Sit to stand pulling on parallel bar or sink with maximal assist. (Target Date: 12/14/2014)   Time 1   Period Months   Status New   Additional Short Term Goals   Additional Short Term Goals Yes   PT SHORT TERM GOAL #6   Title Patient able to tolerate standing at parallel bar for 60 seconds with maximal assist (Target Date: 12/14/2014)   Time 1   Period Months   Status New           PT Long Term Goals - 11/13/14 1315    PT LONG TERM GOAL #1   Title Family demonstrates updated HEP and verbalizes benefits for decreasing LE pain associated with stiffness.  (NEW Target Date: 01/11/2015)   Baseline NOT MET 11/13/2014 Daughter understands basic HEP but needs instruction in updated HEP   Time 8   Period Weeks   Status On-going   PT LONG TERM GOAL #2   Title Family performs transfer with patient with sliding board safely. (NEW Target Date: 01/11/2015)   Baseline NOT MET 11/13/2014 Patient's daughter needs further instruction for sliding board transfer w/c to/from mat.    Time 8   Period Weeks   Status Revised   PT LONG TERM GOAL #3   Title Patient able to reach 5" anteriorly, left & across midline with left UE sitting at edge of mat or w/c with feet on floor with supervision.   (Target Date: 11/16/2014)   Baseline MET 11/13/2014   Time 8   Period Weeks   Status Achieved   PT LONG TERM GOAL #4   Title Patient able to roll left & right with rail and boost buttocks in bed to aide with self-care with cues only.   (NEW Target Date: 01/11/2015)   Time 8   Period Weeks  Status On-going   PT LONG TERM GOAL #5   Title Patient sit to stand pulling on parallel bar with moderate assist and maintain standing for 2 minutes with MinA.  (NEW Target Date: 01/11/2015)   Time 8   Period Weeks   Status New               Plan - 11/13/14 1315    Clinical Impression Statement Patient has had difficulty with transportation due amount of assistance for transfers. Her daughter had to pay for transport to therapy. She was just approved for [employer] on 11/05/2014 to help with transportation. She has only attended 11 of 17 visits for first certification period which has limited progress. She requires less assistance for transfers but burden of care on family remains high. With further family & patient training this burden of care can be reduced and improved patient safety.     Pt will benefit from skilled therapeutic intervention in order to improve on the following deficits Abnormal gait;Decreased activity tolerance;Decreased balance;Decreased mobility;Decreased range of motion;Decreased strength;Pain   Rehab Potential Good   PT Frequency 2x / week   PT Duration 8 weeks   PT Treatment/Interventions ADLs/Self Care Home Management;DME Instruction;Functional mobility training;Therapeutic activities;Therapeutic exercise;Balance training;Neuromuscular re-education;Patient/family education;Wheelchair mobility training   PT Next Visit Plan check bed mobility LTG, work on updating HEP in bed,    Consulted and Agree with Plan of Care Patient;Family member/caregiver   Family Member Consulted dtr, [name]        Problem List Patient Active Problem List   Diagnosis Date Noted  . Primary  osteoarthritis of left knee 10/29/2014  . Urinary tract infectious disease 08/21/2014  . Primary osteoarthritis involving multiple joints 07/26/2014  . Pernicious anemia 07/26/2014  . Low TSH level 07/26/2014  . Hearing loss 07/26/2014  . Decreased vision in both eyes 07/26/2014  . Encounter for therapeutic drug monitoring 07/17/2014  . Spastic hemiparesis affecting dominant side ([place]) 05/31/2014  . Acute pulmonary embolism ([place]) 03/14/2014  . DVT of lower extremity, bilateral ([place]) 03/14/2014  . Global aphasia 03/14/2014  . 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 Dysphagia due to recent stroke 03/14/2014  . Aphasia due to stroke 03/13/2014  . Right hemiparesis ([place]) 03/13/2014  . Embolic stroke ([place])   . Cerebral infarction due to embolism of left middle cerebral artery ([place])   . Cerebral infarction due to unspecified mechanism   . Cerebral infarction due to thrombosis of right middle cerebral artery ([place])   . Stroke ([place]) 03/08/2014  . Pulmonary embolism ([place]) 03/08/2014  . CVA (cerebral infarction)   . Right sided weakness   . Back pain 08/29/2013  . Primary localized osteoarthrosis, lower leg 03/06/2013  . IBS (irritable bowel syndrome)   . 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 Multinodular goiter 01/13/2011  . Abnormal chest x-ray 12/12/2010  . HYPERTHYROIDISM 11/11/2009  . Insomnia 07/03/2009  . POSTTRAUMATIC STRESS DISORDER 08/22/2008  . OBESITY 04/21/2008  . Osteoarthritis 04/21/2008  . MIGRAINE HEADACHE 04/20/2008  . Essential hypertension 04/20/2008    Jamey Reas PT, DPT 11/14/2014, 12:42 AM  [facility] [address] [place], [place], [address] Phone: [phone]   Fax:  [phone]  Name: Sandy Barrett MRN: [id number] Date of Birth: 20-Nov-1932

## 2014-11-14 NOTE — Patient Instructions (Addendum)
Extension: Stabilized Bridging (Supine)    Position (A) Helper: Stabilize legs by holding near knees. Block feet if unstable. Motion (B) -Cue patient to exhale while pressing feet down into bed, lifting buttocks.   1. With caregiver stabilizing knees, have patient perform bridging 10 reps, 2-3 times per day. 2. Use bridging to increase Sandy Barrett's independence with dressing, hygiene, and scooting to the head of the bed. Be sure to stabilize knees (as pictured).    CAREGIVER ASSISTED: Hip / Knee Flexion - RIGHT    Caregiver: support Sandy Barrett's RIGHT leg. Cue Sandy Barrett to pull her right knee upward toward her chest as you resist. (Try to help her keep her right knee from falling outward). Then, cue Sandy Barrett to push her right leg downward to straighten her knee as you provide resistance.  Perform 10 reps, 3 times per day, every day.   Getting out of bed    Caregiver - help patient perform the following: 1. Bend knee furthest from edge of bed. If this is the right leg, you may need to support the leg. 2. Cue Sandy Barrett to reach across her body with the arm furthest from edge of bed. If this is the right arm, you will need to support back of the shoulder. 3. Assist Sandy Barrett to transition from lying on back to lying on her side. 4. Cue Sandy Barrett to bend both knees and move them toward the edge of the bed. 5. As both legs are gently moved off edge of bed, assist Sandy Barrett into sitting position.  * Try to allow Sandy Barrett to do as much of this task as she can (safely) perform.   Scooting    [name] should perform this exercise sitting on the edge of her bed with feet touching the floor with caregiver sitting in front of her at all times. Cue Sandy Barrett to "walk" forward to front by shifting weight from buttock to buttock. Repeat going backward. Make sure Sandy Barrett is not shearing skin but is instead shifting weight off hip prior to scooting.   1. Repeat __5__  times per session. Do __3__ sessions per day, every day. 2. Use this scooting technique to increase Sandy Barrett's independence with setting up transfers (for example, scooting to edge of wheelchair seat).

## 2014-11-14 NOTE — Therapy (Signed)
[facility] [address] [place], [place], [address] Phone: [phone]   Fax:  [phone]  Physical Therapy Treatment  Patient Details  Name: Sandy Barrett MRN: [id number] Date of Birth: 1932/10/14 Referring Provider: Alysia Penna, MD  Encounter Date: 11/14/2014      PT End of Session - 11/14/14 1842    Visit Number 12   Number of Visits 17   Date for PT Re-Evaluation 11/16/14   Authorization Type Medicare G-Code every 10th visit   PT Start Time 1402   PT Stop Time 1447   PT Time Calculation (min) 45 min   Activity Tolerance Patient tolerated treatment well   Behavior During Therapy [facility] for tasks assessed/performed      Past Medical History  Diagnosis Date  . HYPERTENSION   . HYPERTHYROIDISM   . OBESITY   . ARTHRITIS   . Posttraumatic stress disorder   . INSOMNIA   . MIGRAINE HEADACHE   . Hyperthyroidism     s/p I-131 ablation 03/2011 of multinod goiter  . Arthritis   . Stroke ([place])   . Diverticulosis     Past Surgical History  Procedure Laterality Date  . Abdominal hysterectomy  1976  . Total hip arthroplasty  1998     right  . Tonsillectomy    . Breast surgery      biopsy  . Radiology with anesthesia Left 03/08/2014    Procedure: RADIOLOGY WITH ANESTHESIA;  Surgeon: Rob Hickman, MD;  Location: [facility];  Service: Radiology;  Laterality: Left;    There were no vitals filed for this visit.  Visit Diagnosis:  Hemiparesis affecting right side as late effect of cerebrovascular accident ([place])  Unsteadiness  Decreased transfer ability      Subjective Assessment - 11/14/14 1801    Subjective Daughter, [name], present with patient today. Daughter states, "I've only been to therapy with Mom maybe once," when asked about home program. When asked about bed mobility, Camilla reports caregivers use a chuck pad to move pt in bed with Total A.    Patient is accompained by: Family member   Daughter, IT sales professional   Pertinent History B MCA CVA with R hemiparesis, global aphasia, apraxia; L knee OA   Patient Stated Goals Her daughter reports would like to improve her strength and ability to move around the house.    Currently in Pain? Yes  4/10 per FLACC scale; pt pointing to/rubbing L knee             [facility] PT Assessment - 11/13/14 1230    Assessment   Referring Provider Alysia Penna, MD                     [facility] Adult PT Treatment/Exercise - 11/14/14 0001    Bed Mobility   Bed Mobility Rolling Right;Rolling Left;Supine to Sit;Sitting - Scoot to [employer] of Bed;Sit to Supine;Scooting to [facility]   Rolling Right 5: Supervision   Rolling Left 4: Min assist   Rolling Left Details (indicate cue type and reason) Manual facilitation R hip/knee flexion, tactile cueing to initiate RUE movement across midline.   Supine to Sit 4: Min assist   Supine to Sit Details (indicate cue type and reason) Pt required min A but >75% cueing to transition from semi reclined (on bolster for increased pt comfort) > L side lying > seated. Required manual facilitation R hip/knee flexion, tactile cueing to initiate RUE movement across midline, advancement of BLE's toward EOB.  Sitting - Scoot to [employer] of Bed 4: Min assist   Sitting - Scoot to [employer] of Bed Details (indicate cue type and reason) Max cueing for weight shifting, RLE WB   Sit to Supine 2: Max assist   Sit to Supine - Details (indicate cue type and reason) Demonstration cueing to manage RLE using LLE; Max A for BLE management and to safely lower trunk   Scooting to [facility] 3: Mod assist   Scooting to [facility] Details (indicate cue type and reason) Manual facilitaiton of R hip/knee flexion; tactile cueing/manual stabilization at R knee for proprioceptive input/increased attention to RLE. Mod A due to daughter manually assisting at hips.   Transfers   Lateral/Scoot Transfers 3: Mod assist;2: Max Art gallery manager Details (indicate  cue type and reason) Required increased time, >75% multimodal cueing for setup, sequencing, full anterior weight shift, RLE weightbearing/activation. Max A to R side due to daughter assisting at hips from behind pt.   Ambulation/Gait   Ambulation/Gait No   Neuro Re-ed    Neuro Re-ed Details  Explained, demonstrated use of bridging (to scoot) and supine <> sit to increase pt independence with functional mobility and for NMR, RLE activation,  LE co-contraction, and to promote functional return in RUE/LE.  Also explained and demonstrated supine R hip/knee flexion/extension rhythmic initiation and resisted. See Pt Instructions for details.   Exercises   Exercises --                PT Education - 11/14/14 1837    Education provided Yes   Education Details HEP with focus on seated/supine scooting, bed mobility; see Pt Instructions. Emphasized importance of allowing pt to perform as of functional mobility as possible.   Person(s) Educated Patient;Child(ren)  daughter, [name]   Methods Explanation;Demonstration   Comprehension Verbalized understanding          PT Short Term Goals - 11/14/14 1843    PT SHORT TERM GOAL #1   Title Family demonstrates updated, initial HEP correctly with minimal cues. (Target Date: 10/18/2014)   Baseline MET 10/23/2014 with HEP instructed to date.   Time 4   Period Weeks   Status Achieved   PT SHORT TERM GOAL #2   Title Sliding Board Transfers w/c to level surface with maximal assist.  (Target Date: 10/18/2014)   Baseline MET 10/23/2014   Time 4   Period Weeks   Status Achieved   PT SHORT TERM GOAL #3   Title Patient performs sitting balance at edge of mat reaching 2" anteriorly & left and across midline with left UE with contact assist.  (Target Date: 10/18/2014)   Baseline MET 10/23/2014   Time 4   Period Weeks   Status Achieved   PT SHORT TERM GOAL #4   Title Daughter demonstrates sliding board transfer with her mother with PT instruction &  minA. (Target Date: 12/14/2014)   Time 1   Period Months   Status New   PT SHORT TERM GOAL #5   Title Sit to stand pulling on parallel bar or sink with maximal assist. (Target Date: 12/14/2014)   Time 1   Period Months   Status New   PT SHORT TERM GOAL #6   Title Patient able to tolerate standing at parallel bar for 60 seconds with maximal assist (Target Date: 12/14/2014)   Time 1   Period Months   Status New           PT Long Term Goals - 11/14/14  [place] #1   Title Family demonstrates updated HEP and verbalizes benefits for decreasing LE pain associated with stiffness.  (NEW Target Date: 01/11/2015)   Baseline NOT MET 11/13/2014 Daughter understands basic HEP but needs instruction in updated HEP   Time 8   Period Weeks   Status On-going   PT LONG TERM GOAL #2   Title Family performs transfer with patient with sliding board safely. (NEW Target Date: 01/11/2015)   Baseline NOT MET 11/13/2014 Patient's daughter needs further instruction for sliding board transfer w/c to/from mat.    Time 8   Period Weeks   Status Revised   PT LONG TERM GOAL #3   Title Patient able to reach 5" anteriorly, left & across midline with left UE sitting at edge of mat or w/c with feet on floor with supervision.  (Target Date: 11/16/2014)   Baseline MET 11/13/2014   Time 8   Period Weeks   Status Achieved   PT LONG TERM GOAL #4   Title Patient able to roll left & right with rail and boost buttocks in bed to aide with self-care with cues only.   (NEW Target Date: 01/11/2015)   Baseline NOT MET 11/9, as pt requires (S) to roll R, min A to roll L, and up to Mod A for boosting/bridging.   Time 8   Period Weeks   Status Not Met   PT LONG TERM GOAL #5   Title Patient sit to stand pulling on parallel bar with moderate assist and maintain standing for 2 minutes with MinA.  (NEW Target Date: 01/11/2015)   Time 8   Period Weeks   Status New               Plan - 11/14/14 1915    Clinical  Impression Statement Session focused on educating pt's daughter on use of functional mobility - supine to/from sit, bridging, and scooting - therapeutically to promote functional return in RLE. This PT emphasized importance of maximizing pt participation in mobility rather than providing total A. Family will likely require reinforcement.    Pt will benefit from skilled therapeutic intervention in order to improve on the following deficits Abnormal gait;Decreased activity tolerance;Decreased balance;Decreased mobility;Decreased range of motion;Decreased strength;Pain   Rehab Potential Good   PT Frequency 2x / week   PT Duration 8 weeks   PT Treatment/Interventions ADLs/Self Care Home Management;DME Instruction;Functional mobility training;Therapeutic activities;Therapeutic exercise;Balance training;Neuromuscular re-education;Patient/family education;Wheelchair mobility training   PT Next Visit Plan Provide printout of 11/9 Pt Instructions (not provided due to time constraint). If [name] present, will need to educate her on bed-level HEP. Continue transfer training with emphasis on full anterior weight shift.   Consulted and Agree with Plan of Care Patient;Family member/caregiver   Family Member Consulted dtr, Camilla        Problem List Patient Active Problem List   Diagnosis Date Noted  . Primary osteoarthritis of left knee 10/29/2014  . Urinary tract infectious disease 08/21/2014  . Primary osteoarthritis involving multiple joints 07/26/2014  . Pernicious anemia 07/26/2014  . Low TSH level 07/26/2014  . Hearing loss 07/26/2014  . Decreased vision in both eyes 07/26/2014  . Encounter for therapeutic drug monitoring 07/17/2014  . Spastic hemiparesis affecting dominant side ([place]) 05/31/2014  . Acute pulmonary embolism ([place]) 03/14/2014  . DVT of lower extremity, bilateral ([place]) 03/14/2014  . Global aphasia 03/14/2014  . 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 Dysphagia due to recent stroke  03/14/2014  . Aphasia due to stroke 03/13/2014  . Right hemiparesis ([place]) 03/13/2014  . Embolic stroke ([place])   . Cerebral infarction due to embolism of left middle cerebral artery ([place])   . Cerebral infarction due to unspecified mechanism   . Cerebral infarction due to thrombosis of right middle cerebral artery ([place])   . Stroke ([place]) 03/08/2014  . Pulmonary embolism ([place]) 03/08/2014  . CVA (cerebral infarction)   . Right sided weakness   . Back pain 08/29/2013  . Primary localized osteoarthrosis, lower leg 03/06/2013  . IBS (irritable bowel syndrome)   . 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 Multinodular goiter 01/13/2011  . Abnormal chest x-ray 12/12/2010  . HYPERTHYROIDISM 11/11/2009  . Insomnia 07/03/2009  . POSTTRAUMATIC STRESS DISORDER 08/22/2008  . OBESITY 04/21/2008  . Osteoarthritis 04/21/2008  . MIGRAINE HEADACHE 04/20/2008  . Essential hypertension 04/20/2008   Billie Ruddy, PT, DPT [facility] [address] [place], [place], [address] Phone: [phone]   Fax:  [phone] 11/14/2014, 7:26 PM   Name: Sandy Barrett MRN: [id number] Date of Birth: [birth year]

## 2014-11-14 NOTE — Therapy (Signed)
[facility] [address] [place], [place], [address] Phone: [phone]   Fax:  [phone]  Speech Language Pathology Treatment  Patient Details  Name: Sandy Barrett MRN: [id number] Date of Birth: 18-May-1932 No Data Recorded  Encounter Date: 11/14/2014      End of Session - 11/14/14 1652    Visit Number 8   Number of Visits 25   Date for SLP Re-Evaluation 12/07/14   SLP Start Time 1319   SLP Stop Time  1400   SLP Time Calculation (min) 41 min   Activity Tolerance Patient tolerated treatment well      Past Medical History  Diagnosis Date  . HYPERTENSION   . HYPERTHYROIDISM   . OBESITY   . ARTHRITIS   . Posttraumatic stress disorder   . INSOMNIA   . MIGRAINE HEADACHE   . Hyperthyroidism     s/p I-131 ablation 03/2011 of multinod goiter  . Arthritis   . Stroke ([place])   . Diverticulosis     Past Surgical History  Procedure Laterality Date  . Abdominal hysterectomy  1976  . Total hip arthroplasty  1998     right  . Tonsillectomy    . Breast surgery      biopsy  . Radiology with anesthesia Left 03/08/2014    Procedure: RADIOLOGY WITH ANESTHESIA;  Surgeon: Rob Hickman, MD;  Location: [facility];  Service: Radiology;  Laterality: Left;    There were no vitals filed for this visit.  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      Subjective Assessment - 11/14/14 1321    Subjective Multisyllabic gibberish               ADULT SLP TREATMENT - 11/14/14 1322    General Information   Behavior/Cognition Alert;Pleasant mood;Cooperative   Treatment Provided   Treatment provided Cognitive-Linquistic   Pain Assessment   Pain Assessment Faces   Pain Score --  attempted but pt does not understand directions for faces   Pain Intervention(s) Monitored during session   Cognitive-Linquistic Treatment   Treatment focused on Apraxia;Aphasia   Skilled Treatment  [name] states tasks at home include yes/no work, conversations "she has conversations with [place]",  and tasks on tablet. [place] with SLP rote songs overall 35-40%, with happy birthday as most accurate at approx 65%. Yes/no board with family and simple ID questions 0% success (total A) - hand over hand A necessary every time. Pt may have answered correctly with nonverbals 3/12 but inconsistent responses noted. Pt with gibberish responses persisting entire session without awareness.    Assessment / Recommendations / Plan   Plan Continue with current plan of care   Progression Toward Goals   Progression toward goals Not progressing toward goals (comment)          SLP Education - 11/14/14 1449    Education provided Yes   Education Details [name] was educated on many tasks to help her mother with at home   Person(s) Educated Patient;Child(ren)   Methods Explanation;Demonstration   Comprehension Verbalized understanding          SLP Short Term Goals - 11/08/14 1705    SLP SHORT TERM GOAL #1   Title pt will receptively ID pictures or objects from f:3, given name of picture/object 30%   Time 1   Period Weeks   Status Not Met   SLP SHORT TERM GOAL #2   Title pt will definitively ID yes/no  responses via communication board or head movements 40% of the time   Time 1   Period Weeks   Status Not Met   SLP SHORT TERM GOAL #3   Title pt will imitate consonant-vowel syllables/words with 40% success   Time 1   Period Weeks   Status Not Met   SLP SHORT TERM GOAL #4   Title pt will perform automatic speech tasks independently with average 30% success including counting 1-20, DOW,    Time 1   Period Weeks   Status Not Met          SLP Long Term Goals - 11/14/14 1654    SLP LONG TERM GOAL #1   Title pt will receptively ID pictures/objects from f:3 40% success   Time 3   Period Weeks   Status On-going   SLP LONG TERM GOAL #2   Title pt will perform activities at home with family as  reported by family members at least 4 days per week   Time 3   Period Weeks   Status On-going   SLP LONG TERM GOAL #3   Title pt will ID yes/no responses with head movement or communication board with 50% of the time   Time 3   Period Weeks   Status Revised   SLP LONG TERM GOAL #4   Title pt will answer yes/no questions re: functional information with 90% success   Time 3   Period Weeks   Status On-going   SLP LONG TERM GOAL #5   Title pt will perform automatic speech tasks with average 40% success including DOW and counting 1-20   Time 3   Period Weeks   Status On-going   SLP LONG TERM GOAL #6   Title pt will use multimodal communication to expressively communicate in 70% of opportunities   Time 3   Period Weeks   Status On-going          Plan - 11/14/14 1653    Clinical Impression Statement Deficits which are severe/profound in nature cont, 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 Pt to cont skilled ST to maximize language skills and decr caregiver burden. Pt progress may be beginning to plateau. Discharge possible in next 1-2 weeks.   Speech Therapy Frequency 3x / week  however pt scheduling x2/week   Duration --  3 weeks   Treatment/Interventions Patient/family education;Multimodal communcation approach;Language facilitation;SLP instruction and feedback;Compensatory strategies;Cueing hierarchy;Functional tasks   Potential to Achieve Goals Fair   Potential Considerations Severity of impairments        Problem List Patient Active Problem List   Diagnosis Date Noted  . Primary osteoarthritis of left knee 10/29/2014  . Urinary tract infectious disease 08/21/2014  . Primary osteoarthritis involving multiple joints 07/26/2014  . Pernicious anemia 07/26/2014  . Low TSH level 07/26/2014  . Hearing loss 07/26/2014  . Decreased vision in both eyes 07/26/2014  . Encounter for therapeutic drug monitoring 07/17/2014  . Spastic hemiparesis affecting dominant side ([place]) 05/31/2014  .  Acute pulmonary embolism ([place]) 03/14/2014  . DVT of lower extremity, bilateral ([place]) 03/14/2014  . Global aphasia 03/14/2014  . 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 Dysphagia due to recent stroke 03/14/2014  . Aphasia due to stroke 03/13/2014  . Right hemiparesis ([place]) 03/13/2014  . Embolic stroke ([place])   . Cerebral infarction due to embolism of left middle cerebral artery ([place])   . Cerebral infarction due to unspecified mechanism   . Cerebral infarction due to thrombosis of right  middle cerebral artery ([place])   . Stroke ([place]) 03/08/2014  . Pulmonary embolism ([place]) 03/08/2014  . CVA (cerebral infarction)   . Right sided weakness   . Back pain 08/29/2013  . Primary localized osteoarthrosis, lower leg 03/06/2013  . IBS (irritable bowel syndrome)   . 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 Multinodular goiter 01/13/2011  . Abnormal chest x-ray 12/12/2010  . HYPERTHYROIDISM 11/11/2009  . Insomnia 07/03/2009  . POSTTRAUMATIC STRESS DISORDER 08/22/2008  . OBESITY 04/21/2008  . Osteoarthritis 04/21/2008  . MIGRAINE HEADACHE 04/20/2008  . Essential hypertension 04/20/2008    [facility] , MS, [place]  11/14/2014, 4:55 PM  [facility] [address] [place], [place], [address] Phone: [phone]   Fax:  [phone]   Name: Sandy Barrett MRN: [id number] Date of Birth: [birth year]

## 2014-11-16 ENCOUNTER — Ambulatory Visit: Payer: Medicare Other

## 2014-11-16 DIAGNOSIS — IMO0002 Reserved for concepts with insufficient information to code with codable children: Secondary | ICD-10-CM

## 2014-11-16 DIAGNOSIS — M623 Immobility syndrome (paraplegic): Secondary | ICD-10-CM | POA: Diagnosis not present

## 2014-11-16 DIAGNOSIS — M79605 Pain in left leg: Secondary | ICD-10-CM | POA: Diagnosis not present

## 2014-11-16 DIAGNOSIS — R4701 Aphasia: Secondary | ICD-10-CM

## 2014-11-16 DIAGNOSIS — I69351 Hemiplegia and hemiparesis following cerebral infarction affecting right dominant side: Secondary | ICD-10-CM | POA: Diagnosis not present

## 2014-11-16 DIAGNOSIS — Z7409 Other reduced mobility: Secondary | ICD-10-CM | POA: Diagnosis not present

## 2014-11-16 DIAGNOSIS — M79602 Pain in left arm: Secondary | ICD-10-CM | POA: Diagnosis not present

## 2014-11-16 DIAGNOSIS — R2681 Unsteadiness on feet: Secondary | ICD-10-CM | POA: Diagnosis not present

## 2014-11-16 NOTE — Therapy (Signed)
[facility] [address] [place], [place], [address] Phone: [phone]   Fax:  [phone]  Speech Language Pathology Treatment  Patient Details  Name: Sandy Barrett MRN: [id number] Date of Birth: February 28, 1932 No Data Recorded  Encounter Date: 11/16/2014      End of Session - 11/16/14 1704    Visit Number 9   Number of Visits 25   Date for SLP Re-Evaluation 12/07/14   SLP Start Time 2595   SLP Stop Time  1400   SLP Time Calculation (min) 43 min   Activity Tolerance Patient tolerated treatment well      Past Medical History  Diagnosis Date  . HYPERTENSION   . HYPERTHYROIDISM   . OBESITY   . ARTHRITIS   . Posttraumatic stress disorder   . INSOMNIA   . MIGRAINE HEADACHE   . Hyperthyroidism     s/p I-131 ablation 03/2011 of multinod goiter  . Arthritis   . Stroke ([place])   . Diverticulosis     Past Surgical History  Procedure Laterality Date  . Abdominal hysterectomy  1976  . Total hip arthroplasty  1998     right  . Tonsillectomy    . Breast surgery      biopsy  . Radiology with anesthesia Left 03/08/2014    Procedure: RADIOLOGY WITH ANESTHESIA;  Surgeon: Rob Hickman, MD;  Location: [facility];  Service: Radiology;  Laterality: Left;    There were no vitals filed for this visit.  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 Little success with yes/no board at home. Pt does a lot of singing for practice at home. Singing happy birthday spontaneous 30%; incr'd to 50% simultaneous. Other songs <30% success simultaneous. Pt with 0% success with object ID (f:3) with max A from SLP including gestures. Pt req'd consistent hand-over -hand assist with dot to dot "A". Pt's daughter watched SLP and SLP shared that pt would benefit from any of these tasks at home.    Assessment / Recommendations / Plan   Plan Continue with current plan of care   Progression Toward Goals   Progression toward goals Not progressing toward goals (comment)  cont two more sessions to give daughters ideas for home task          SLP Education - 11/16/14 1704    Education provided Yes   Education Details home tasks   Person(s) Educated Child(ren);Patient   Methods Explanation;Demonstration   Comprehension Verbalized understanding          SLP Short Term Goals - 11/08/14 1705    SLP SHORT TERM GOAL #1   Title pt will receptively ID pictures or objects from f:3, given name of picture/object 30%   Time 1   Period Weeks   Status Not Met   SLP SHORT TERM GOAL #2   Title pt  will definitively ID yes/no responses via communication board or head movements 40% of the time   Time 1   Period Weeks   Status Not Met   SLP SHORT TERM GOAL #3   Title pt will imitate consonant-vowel syllables/words with 40% success   Time 1   Period Weeks   Status Not Met   SLP SHORT TERM GOAL #4   Title pt will perform automatic speech tasks independently with average 30% success including counting 1-20, DOW,    Time 1   Period Weeks   Status Not Met          SLP Long Term Goals - 11/16/14 1707    SLP LONG TERM GOAL #1   Title pt will receptively ID pictures/objects from f:3 40% success   Time 3   Period Weeks   Status On-going   SLP LONG TERM GOAL #2   Title pt will perform activities at home with family as reported by  family members at least 4 days per week   Time 3   Period Weeks   Status On-going   SLP LONG TERM GOAL #3   Title pt will ID yes/no responses with head movement or communication board with 50% of the time   Time 3   Period Weeks   Status Revised   SLP LONG TERM GOAL #4   Title pt will answer yes/no questions re: functional information with 90% success   Time 3   Period Weeks   Status On-going   SLP LONG TERM GOAL #5   Title pt will perform automatic speech tasks with average 40% success including DOW and counting 1-20   Time 3   Period Weeks   Status On-going   SLP LONG TERM GOAL #6   Title pt will use multimodal communication to expressively communicate in 70% of opportunities   Time 3   Period Weeks   Status On-going          Plan - 11/16/14 1705    Clinical Impression Statement Deficits which are severe/profound in nature cont, 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 Pt to cont skilled ST for two more sessions (11-22-14, 12-04-14) to maximize examples for daughter/s to perform therapy tasks at home with pt.    Speech Therapy Frequency 3x / week  however pt cont to schedule x2/week   Duration 2 weeks   Treatment/Interventions Patient/family education;Multimodal communcation approach;Language facilitation;SLP instruction and feedback;Compensatory strategies;Cueing hierarchy;Functional tasks   Potential to Achieve Goals Fair   Potential Considerations Severity of impairments;Cooperation/participation level  daughter reports pt's particiapation with family pictures (receptive language) is minimal        Problem List Patient Active Problem List   Diagnosis Date Noted  . Primary osteoarthritis of left knee 10/29/2014  . Urinary tract infectious disease 08/21/2014  . Primary osteoarthritis involving multiple joints 07/26/2014  . Pernicious anemia 07/26/2014  . Low TSH level 07/26/2014  . Hearing loss 07/26/2014  . Decreased vision in both eyes 07/26/2014  . Encounter for  therapeutic drug monitoring 07/17/2014  . Spastic hemiparesis affecting dominant side ([place]) 05/31/2014  . Acute pulmonary embolism ([place]) 03/14/2014  . DVT of lower extremity, bilateral ([place]) 03/14/2014  . Global aphasia 03/14/2014  . 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 Dysphagia due to recent stroke 03/14/2014  . Aphasia due to stroke 03/13/2014  . Right hemiparesis ([place]) 03/13/2014  . Embolic stroke ([place])   . Cerebral infarction due to embolism of left middle cerebral artery ([place])   .  Cerebral infarction due to unspecified mechanism   . Cerebral infarction due to thrombosis of right middle cerebral artery ([place])   . Stroke ([place]) 03/08/2014  . Pulmonary embolism ([place]) 03/08/2014  . CVA (cerebral infarction)   . Right sided weakness   . Back pain 08/29/2013  . Primary localized osteoarthrosis, lower leg 03/06/2013  . IBS (irritable bowel syndrome)   . 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 Multinodular goiter 01/13/2011  . Abnormal chest x-ray 12/12/2010  . HYPERTHYROIDISM 11/11/2009  . Insomnia 07/03/2009  . POSTTRAUMATIC STRESS DISORDER 08/22/2008  . OBESITY 04/21/2008  . Osteoarthritis 04/21/2008  . MIGRAINE HEADACHE 04/20/2008  . Essential hypertension 04/20/2008    [facility] , [place], [place]  11/16/2014, 5:08 PM  [facility] [address] [place], [place], [address] Phone: [phone]   Fax:  [phone]   Name: Marchelle Rinella MRN: [id number] Date of Birth: 03/16/1932

## 2014-11-19 ENCOUNTER — Ambulatory Visit: Payer: Medicare Other | Admitting: Physical Therapy

## 2014-11-19 ENCOUNTER — Encounter: Payer: Self-pay | Admitting: Vascular Surgery

## 2014-11-19 DIAGNOSIS — Z7409 Other reduced mobility: Secondary | ICD-10-CM | POA: Diagnosis not present

## 2014-11-19 DIAGNOSIS — M79602 Pain in left arm: Secondary | ICD-10-CM | POA: Diagnosis not present

## 2014-11-19 DIAGNOSIS — R2681 Unsteadiness on feet: Secondary | ICD-10-CM | POA: Diagnosis not present

## 2014-11-19 DIAGNOSIS — R252 Cramp and spasm: Secondary | ICD-10-CM

## 2014-11-19 DIAGNOSIS — M79605 Pain in left leg: Secondary | ICD-10-CM | POA: Diagnosis not present

## 2014-11-19 DIAGNOSIS — I69351 Hemiplegia and hemiparesis following cerebral infarction affecting right dominant side: Secondary | ICD-10-CM

## 2014-11-19 DIAGNOSIS — M623 Immobility syndrome (paraplegic): Secondary | ICD-10-CM | POA: Diagnosis not present

## 2014-11-19 NOTE — Patient Instructions (Signed)
Extension: Stabilized Bridging (Supine)    Position (A) Helper: Stabilize legs by holding near knees. Block feet if unstable. Motion (B) -Cue patient to exhale while pressing feet down into bed, lifting buttocks.   1. With caregiver stabilizing knees, have patient perform bridging 10 reps, 2-3 times per day. 2. Use bridging to increase [name]'s independence with dressing, hygiene, and scooting to the head of the bed. Be sure to stabilize knees (as pictured).    CAREGIVER ASSISTED: Hip / Knee Flexion - RIGHT    Caregiver: support [name]'s RIGHT leg. Cue [name] to pull her right knee upward toward her chest as you resist. (Try to help her keep her right knee from falling outward). Then, cue [name] to push her right leg downward to straighten her knee as you provide resistance.  Perform 10 reps, 3 times per day, every day.   Getting out of bed    Caregiver - help patient perform the following: 1. Bend knee furthest from edge of bed. If this is the right leg, you may need to support the leg. 2. Cue [name] to reach across her body with the arm furthest from edge of bed. If this is the right arm, you will need to support back of the shoulder. 3. Assist [name] to transition from lying on back to lying on her side. 4. Cue [name] to bend both knees and move them toward the edge of the bed. 5. As both legs are gently moved off edge of bed, assist [name] into sitting position.  * Try to allow [name] to do as much of this task as she can (safely) perform.   Scooting    Ms Sandy Barrett should perform this exercise sitting on the edge of her bed with feet touching the floor with caregiver sitting in front of her at all times. Cue [name] to "walk" forward to front by shifting weight from buttock to buttock. Repeat going backward. Make sure [name] is not shearing skin but is instead shifting weight off hip prior to scooting.   1. Repeat __5__  times per session. Do __3__ sessions per day, every day. 2. Use this scooting technique to increase [name]'s independence with setting up transfers (for example, scooting to edge of wheelchair seat). 

## 2014-11-19 NOTE — Therapy (Signed)
[facility] [address] [place], [place], [address] Phone: [phone]   Fax:  [phone]  Physical Therapy Treatment  Patient Details  Name: Sandy Barrett MRN: [id number] Date of Birth: 1932-03-14 Referring Provider: Alysia Penna, MD  Encounter Date: 11/19/2014      PT End of Session - 11/19/14 1334    Visit Number 13   Number of Visits 27  per PT recert   Date for PT Re-Evaluation 57/32/20  per PT recert   Authorization Type Medicare G-Code every 10th visit   PT Start Time 1220   PT Stop Time 1308   PT Time Calculation (min) 48 min   Activity Tolerance Patient tolerated treatment well   Behavior During Therapy [facility] for tasks assessed/performed      Past Medical History  Diagnosis Date  . HYPERTENSION   . HYPERTHYROIDISM   . OBESITY   . ARTHRITIS   . Posttraumatic stress disorder   . INSOMNIA   . MIGRAINE HEADACHE   . Hyperthyroidism     s/p I-131 ablation 03/2011 of multinod goiter  . Arthritis   . Stroke ([place])   . Diverticulosis     Past Surgical History  Procedure Laterality Date  . Abdominal hysterectomy  1976  . Total hip arthroplasty  1998     right  . Tonsillectomy    . Breast surgery      biopsy  . Radiology with anesthesia Left 03/08/2014    Procedure: RADIOLOGY WITH ANESTHESIA;  Surgeon: Rob Hickman, MD;  Location: [facility];  Service: Radiology;  Laterality: Left;    There were no vitals filed for this visit.  Visit Diagnosis:  Decreased transfer ability  Hemiparesis affecting right side as late effect of cerebrovascular accident [facility])  Unsteadiness  Spasticity      Subjective Assessment - 11/19/14 1319    Subjective Daughter, Sandy Barrett, accompanied pt to today's session. Pt cooperative, smiling throughout today's session.   Patient is accompained by: Family member  Daughter, Sandy Barrett   Pertinent History B MCA CVA with R hemiparesis, global aphasia, apraxia; L knee  OA   Patient Stated Goals Her daughter reports would like to improve her strength and ability to move around the house.    Currently in Pain? Yes  3/10 (per FLACC scale) in L knee during LLE WB activities                         [facility] Adult PT Treatment/Exercise - 11/19/14 0001    Bed Mobility   Bed Mobility Rolling Right;Rolling Left;Supine to Sit;Sitting - Scoot to [employer] of Bed;Sit to Supine;Scooting to [facility]   Rolling Right 5: Supervision   Rolling Left --   Supine to Sit 3: Mod assist  on 20" mat table to simulate home environment   Supine to Sit Details (indicate cue type and reason) Semi reclined (on wedge for increased pt comfort) > R side lying > seated with mod A. With verbal cueing, pt able to initiate LUE movement across midline and L hip/knee flexion; however, required assist to roll from supine > R side lying to ensure safe RUE positioning; required manual facilitation to advance BLE's to EOM.   Sitting - Scoot to [place] of Bed 5: Supervision  on 20" mat table to simulate home environment   Sitting - Scoot to Edge of Bed Details (indicate cue type and reason) required increased time and 25% verbal/demonstration cueing for reciprocal scooting.  Sit to Supine 2: Max assist  on 20" mat table to simulate home environment   Sit to Supine - Details (indicate cue type and reason) Continues to require demonstration cueing, manual facilitation to manage RLE using LLE. Max A required for BLE management and to control descent of trunk/   Scooting to [facility] --   Transfers   Lateral/Scoot Transfers 3: Mod assist;With slide board;With armrests removed   Lateral/Scoot Transfer Details (indicate cue type and reason) Transferred from w/c <> mat table with Mod A, total assist for w/c setup/parts management, and slideboard placement. Pt's LUE at chair arm (PT sitting in chair anterior to pt) to facilitate full anterior weight shift. Multimodal cueing focused on full anterior weight shift  to allow for adequate lifting/lowering hips to scoot over slideboard.   Ambulation/Gait   Ambulation/Gait No   Neuro Re-ed    Neuro Re-ed Details  Reiterated the following education (explanation and demonstration) to daughter, Sandy Barrett, to promote carryover of the following: use of bridging (to scoot) and supine <> sit to increase pt independence with functional mobility and for NMR, RLE activation,  LE co-contraction, and to promote functional return in RUE/LE.  Also explained and demonstrated supine R hip/knee flexion/extension rhythmic initiation and resisted. See Pt Instructions for details.                PT Education - 11/19/14 1322    Education provided Yes   Education Details HEP with focus on bed mobility and R LE NMR; see Pt Instructions. Reiterated importance of pt actively participating in functional mobility (focus on bed mobility, scooting) as much as possible.    Person(s) Educated Patient;Child(ren)  daughter, Sandy Barrett   Methods Explanation;Demonstration;Tactile cues;Verbal cues;Handout   Comprehension Verbalized understanding;Returned demonstration          PT Short Term Goals - 11/14/14 1843    PT SHORT TERM GOAL #1   Title Family demonstrates updated, initial HEP correctly with minimal cues. (Target Date: 10/18/2014)   Baseline MET 10/23/2014 with HEP instructed to date.   Time 4   Period Weeks   Status Achieved   PT SHORT TERM GOAL #2   Title Sliding Board Transfers w/c to level surface with maximal assist.  (Target Date: 10/18/2014)   Baseline MET 10/23/2014   Time 4   Period Weeks   Status Achieved   PT SHORT TERM GOAL #3   Title Patient performs sitting balance at edge of mat reaching 2" anteriorly & left and across midline with left UE with contact assist.  (Target Date: 10/18/2014)   Baseline MET 10/23/2014   Time 4   Period Weeks   Status Achieved   PT SHORT TERM GOAL #4   Title Daughter demonstrates sliding board transfer with her mother with PT  instruction & minA. (Target Date: 12/14/2014)   Time 1   Period Months   Status New   PT SHORT TERM GOAL #5   Title Sit to stand pulling on parallel bar or sink with maximal assist. (Target Date: 12/14/2014)   Time 1   Period Months   Status New   PT SHORT TERM GOAL #6   Title Patient able to tolerate standing at parallel bar for 60 seconds with maximal assist (Target Date: 12/14/2014)   Time 1   Period Months   Status New           PT Long Term Goals - 11/14/14 1921    PT LONG TERM GOAL #1   Title Family  demonstrates updated HEP and verbalizes benefits for decreasing LE pain associated with stiffness.  (NEW Target Date: 01/11/2015)   Baseline NOT MET 11/13/2014 Daughter understands basic HEP but needs instruction in updated HEP   Time 8   Period Weeks   Status On-going   PT LONG TERM GOAL #2   Title Family performs transfer with patient with sliding board safely. (NEW Target Date: 01/11/2015)   Baseline NOT MET 11/13/2014 Patient's daughter needs further instruction for sliding board transfer w/c to/from mat.    Time 8   Period Weeks   Status Revised   PT LONG TERM GOAL #3   Title Patient able to reach 5" anteriorly, left & across midline with left UE sitting at edge of mat or w/c with feet on floor with supervision.  (Target Date: 11/16/2014)   Baseline MET 11/13/2014   Time 8   Period Weeks   Status Achieved   PT LONG TERM GOAL #4   Title Patient able to roll left & right with rail and boost buttocks in bed to aide with self-care with cues only.   (NEW Target Date: 01/11/2015)   Baseline NOT MET 11/9, as pt requires (S) to roll R, min A to roll L, and up to Mod A for boosting/bridging.   Time 8   Period Weeks   Status Not Met   PT LONG TERM GOAL #5   Title Patient sit to stand pulling on parallel bar with moderate assist and maintain standing for 2 minutes with MinA.  (NEW Target Date: 01/11/2015)   Time 8   Period Weeks   Status New               Plan - 11/19/14 1337     Clinical Impression Statement Skilled session focused on educating pt's other daughter, Sandy Barrett, on HEP and on importance of promoting pt active participation in functional mobility (supine <> sit, bridging to scoot to [facility]) to promote muscle activation and functional return of RUE/LE. Daughter, Sandy Barrett, engaged throughout session and gave effective return demonstration of appropriate assist/cueing during bridging and RLE NMR.  Greatest barrier to independence with transfers is pt performance of full anterior weight shift to initiate scooting.   Pt will benefit from skilled therapeutic intervention in order to improve on the following deficits Abnormal gait;Decreased activity tolerance;Decreased balance;Decreased mobility;Decreased range of motion;Decreased strength;Pain   Rehab Potential Good   PT Frequency 2x / week   PT Duration 8 weeks   PT Treatment/Interventions ADLs/Self Care Home Management;DME Instruction;Functional mobility training;Therapeutic activities;Therapeutic exercise;Balance training;Neuromuscular re-education;Patient/family education;Wheelchair mobility training   PT Next Visit Plan Work on pt initiating full anterior weight shift to promote independence with scooting, transfers.   Consulted and Agree with Plan of Care Patient;Family member/caregiver   Family Member Consulted daughter, Sandy Barrett        Problem List Patient Active Problem List   Diagnosis Date Noted  . Primary osteoarthritis of left knee 10/29/2014  . Urinary tract infectious disease 08/21/2014  . Primary osteoarthritis involving multiple joints 07/26/2014  . Pernicious anemia 07/26/2014  . Low TSH level 07/26/2014  . Hearing loss 07/26/2014  . Decreased vision in both eyes 07/26/2014  . Encounter for therapeutic drug monitoring 07/17/2014  . Spastic hemiparesis affecting dominant side ([place]) 05/31/2014  . Acute pulmonary embolism ([place]) 03/14/2014  . DVT of lower extremity, bilateral ([place]) 03/14/2014  .  Global aphasia 03/14/2014  . 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 Dysphagia due to recent stroke 03/14/2014  .  Aphasia due to stroke 03/13/2014  . Right hemiparesis ([place]) 03/13/2014  . Embolic stroke ([place])   . Cerebral infarction due to embolism of left middle cerebral artery ([place])   . Cerebral infarction due to unspecified mechanism   . Cerebral infarction due to thrombosis of right middle cerebral artery ([place])   . Stroke ([place]) 03/08/2014  . Pulmonary embolism ([place]) 03/08/2014  . CVA (cerebral infarction)   . Right sided weakness   . Back pain 08/29/2013  . Primary localized osteoarthrosis, lower leg 03/06/2013  . IBS (irritable bowel syndrome)   . 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 Multinodular goiter 01/13/2011  . Abnormal chest x-ray 12/12/2010  . HYPERTHYROIDISM 11/11/2009  . Insomnia 07/03/2009  . POSTTRAUMATIC STRESS DISORDER 08/22/2008  . OBESITY 04/21/2008  . Osteoarthritis 04/21/2008  . MIGRAINE HEADACHE 04/20/2008  . Essential hypertension 04/20/2008    Billie Ruddy, PT, DPT [facility] [address] [place], [place], [address] Phone: [phone]   Fax:  [phone] 11/19/2014, 1:44 PM   Name: Lashala Laser MRN: [id number] Date of Birth: May 23, 1932

## 2014-11-20 ENCOUNTER — Encounter: Payer: Self-pay | Admitting: Physical Medicine & Rehabilitation

## 2014-11-20 ENCOUNTER — Ambulatory Visit (HOSPITAL_BASED_OUTPATIENT_CLINIC_OR_DEPARTMENT_OTHER): Payer: Medicare Other | Admitting: Physical Medicine & Rehabilitation

## 2014-11-20 ENCOUNTER — Encounter: Payer: Self-pay | Admitting: Vascular Surgery

## 2014-11-20 ENCOUNTER — Encounter: Payer: Medicare Other | Attending: Physical Medicine & Rehabilitation

## 2014-11-20 VITALS — BP 112/69 | HR 82

## 2014-11-20 DIAGNOSIS — I639 Cerebral infarction, unspecified: Secondary | ICD-10-CM | POA: Diagnosis not present

## 2014-11-20 DIAGNOSIS — M7541 Impingement syndrome of right shoulder: Secondary | ICD-10-CM | POA: Insufficient documentation

## 2014-11-20 DIAGNOSIS — G8190 Hemiplegia, unspecified affecting unspecified side: Secondary | ICD-10-CM | POA: Diagnosis not present

## 2014-11-20 DIAGNOSIS — M6289 Other specified disorders of muscle: Secondary | ICD-10-CM | POA: Diagnosis not present

## 2014-11-20 DIAGNOSIS — G8110 Spastic hemiplegia affecting unspecified side: Secondary | ICD-10-CM

## 2014-11-20 DIAGNOSIS — R482 Apraxia: Secondary | ICD-10-CM | POA: Diagnosis not present

## 2014-11-20 DIAGNOSIS — R4701 Aphasia: Secondary | ICD-10-CM

## 2014-11-20 DIAGNOSIS — IMO0002 Reserved for concepts with insufficient information to code with codable children: Secondary | ICD-10-CM

## 2014-11-20 DIAGNOSIS — I69320 Aphasia following cerebral infarction: Secondary | ICD-10-CM | POA: Diagnosis not present

## 2014-11-20 DIAGNOSIS — R4702 Dysphasia: Secondary | ICD-10-CM | POA: Diagnosis not present

## 2014-11-20 NOTE — Patient Instructions (Addendum)
Visit is for Botox reinjection since that should start wearing off soon.  We will add some Botox in the right upper chest/shoulder region to help loosen up the shoulder and reduce pain

## 2014-11-20 NOTE — Progress Notes (Signed)
Subjective:    Patient ID: Sandy Barrett, female    DOB: September 21, 1932, 81 y.o.   MRN: [id number] 81 y.o. female with history of HTN, OA, Migraines, hyperlipidemia who was admitted on 03/08/14 with right sided weakness, difficulty talking and right facial droop. Patient had a hair appointment that day at 1300 hours and the hair dresser immediately noted she was confused and not talking correctly. CT head showed dense left MCA sign and Perfusion study showed a large area of penumbra involving the left MCA territory.  Patient last seen normal at noon that day and tPA administered past CT chest negative for dissection. However incidental note of large thyroid mass displacing the trachea to the right, left internal jugular thormbus likely due to extrinsic compression by goiter, question right jugular thrombus and bilateral pulmonary emboli. Patient underwent cerebral angiogram with unsuccessful attempts at revascularization due to technical difficulties.  MRI brain done revealing patchy areas of acute infarct L-MCA territory and severe punctate foci in R-MCA territory with possible trace SAH in high right frontal lobe. MRA brain with occluded L-ICA and L-MCA.   CIR inpt Admit date: 03/13/2014 Discharge date: 04/05/2014  HPI Patient has had skilled nursing facility level PT and OT and speech following IRF level. Currently receiving outpatient therapy at neuro rehabilitation. Daughter is wondering what else can be done to help her. She is doing her home exercise program. Left knee pain appears to be doing better after injection last month. Still has some left hip pain according to her daughter. Using over-the-counter lidocaine patches. Right hand is getting stiff again. Upon further questioning has not been using any wrist hand orthosis recently.  Right upper extremity swelling has improved.  Pain Inventory Average Pain 6 Pain Right Now 5 My pain is sharp and aching  In the last 24 hours, has pain  interfered with the following? General activity 8 Relation with others 8 Enjoyment of life 8 What TIME of day is your pain at its worst? morning Sleep (in general) Fair  Pain is worse with: bending, standing and some activites Pain improves with: medication Relief from Meds: 5  Mobility ability to climb steps?  no do you drive?  no use a wheelchair needs help with transfers Do you have any goals in this area?  yes  Function retired I need assistance with the following:  dressing, bathing, toileting, meal prep, household duties and shopping Do you have any goals in this area?  yes  Neuro/Psych bladder control problems bowel control problems weakness trouble walking  Prior Studies Any changes since last visit?  no  Physicians involved in your care Any changes since last visit?  no   Family History  Problem Relation Age of Onset  . Asthma Mother   . Asthma Father   . Prostate cancer Father   . Stroke Brother    Social History   Social History  . Marital Status: Widowed    Spouse Name: N/A  . Number of Children: 2  . Years of Education: 14   Occupational History  . retired     Restaurant manager, fast food   Social History Main Topics  . Smoking status: Never Smoker   . Smokeless tobacco: Never Used  . Alcohol Use: No  . Drug Use: No  . Sexual Activity: No   Other Topics Concern  . None   Social History Narrative   Widowed, lived alone prior to CVA, currently [facility] at [facility]   Right handed   Caffeine  use- occasionally drinks tea   Past Surgical History  Procedure Laterality Date  . Abdominal hysterectomy  1976  . Total hip arthroplasty  1998     right  . Tonsillectomy    . Breast surgery      biopsy  . Radiology with anesthesia Left 03/08/2014    Procedure: RADIOLOGY WITH ANESTHESIA;  Surgeon: Rob Hickman, MD;  Location: [place];  Service: Radiology;  Laterality: Left;   Past Medical History  Diagnosis Date  . HYPERTENSION   .  HYPERTHYROIDISM   . OBESITY   . ARTHRITIS   . Posttraumatic stress disorder   . INSOMNIA   . MIGRAINE HEADACHE   . Hyperthyroidism     s/p I-131 ablation 03/2011 of multinod goiter  . Arthritis   . Stroke ([place])   . Diverticulosis    BP 112/69 mmHg  Pulse 82  SpO2 94%  Opioid Risk Score:   Fall Risk Score:  `1  Depression screen PHQ 2/9  Depression screen [facility] 2/9 09/17/2014 08/21/2014 08/20/2014 08/07/2013 12/24/2011  Decreased Interest 0 0 0 0 0  Down, Depressed, Hopeless 0 0 1 1 0  PHQ - 2 Score 0 0 1 1 0     Review of Systems  Gastrointestinal:       Bowel control problems  Genitourinary:       Bladder control problems  Neurological: Positive for weakness.       Gait Instability  All other systems reviewed and are negative.      Objective:   Physical Exam  Constitutional: She is oriented to person, place, and time. She appears well-developed and well-nourished.  HENT:  Head: Normocephalic and atraumatic.  Eyes: Conjunctivae and EOM are normal. Pupils are equal, round, and reactive to light.  Neurological: She is alert and oriented to person, place, and time.  Psychiatric: She has a normal mood and affect.  Nursing note and vitals reviewed.   Ashworth grade 3 in the right finger flexors thumb flexor wrist flexor Ashworth score 1 at the elbow flexor Ashworth score 3 at the right pectoralis.  Motor strength is 0/5 in the right deltoid, biceps, triceps, grip 0/5 at the right hip flexor and knee extensor ankle dorsiflexor and plantar flexor  Speech Unable to follow simple commands such as close your eyes. Cannot follow commands for manual muscle testing. Expressive language make some utterances but no actual words.        Assessment & Plan:  1. Right spastic hemiplegia secondary to left MCA infarct. She has had little motor improvement thus far. She also continues to have severe Aphasia both receptive and expressive. I would recommend continue PTOT and speech  as an outpatient.  2. Bilateral knee osteoarthritis lateral compartment primarily. She has pain mainly on the left side. She has had knee injections bilaterally with last injection performed in February 2016.  Status post right knee injection October 2016, With improvements    3. Rightshoulder subluxation as well as Right shoulder adhesive capsulitis. She has chronic shoulder pain. Recommend trial of neuromuscular stimulation. Recommend shoulder injection if this does not improve. She may be a candidate for percutaneous neuromuscular electrical stimulation Pectoralis Botox may be helpful as well to increase range of motion   4. Severe spasticity right upper extremity. She's had some improvement with Botox injection On 09/17/2014 however I would recommend higher dose For the next injection. FCR50  FDS50 FDP50 FPL50   PT50 FCU50 Will also add right pectoralis, 100 units EMG guidance

## 2014-11-21 ENCOUNTER — Encounter: Payer: Self-pay | Admitting: Vascular Surgery

## 2014-11-21 ENCOUNTER — Ambulatory Visit (INDEPENDENT_AMBULATORY_CARE_PROVIDER_SITE_OTHER): Payer: Medicare Other | Admitting: Vascular Surgery

## 2014-11-21 VITALS — BP 114/71 | HR 87 | Temp 98.3°F | Resp 14 | Ht 60.0 in | Wt 164.0 lb

## 2014-11-21 DIAGNOSIS — I639 Cerebral infarction, unspecified: Secondary | ICD-10-CM | POA: Diagnosis not present

## 2014-11-21 DIAGNOSIS — M7989 Other specified soft tissue disorders: Secondary | ICD-10-CM

## 2014-11-21 NOTE — Progress Notes (Signed)
[facility] HISTORY AND PHYSICAL   History of Present Illness:  Patient is a 81 y.o. year old female who presents for evaluation of dusky and cool extremities.  The patient had a fairly debilitating stroke in March 2016. At this point she is unable to walk or speak. At the time of that hospital admission she was also noted to have bilateral femoral DVTs. She is on  Warfarin. She occasionally complains of some pain in her feet. Most of the history today was obtained from the patient's daughter since the patient is not really able to speak. She is wheelchair-bound. Other medical problems include  Hypertension, arthritis diverticulosis all of which are currently stable.  Past Medical History  Diagnosis Date  . HYPERTENSION   . HYPERTHYROIDISM   . OBESITY   . ARTHRITIS   . Posttraumatic stress disorder   . INSOMNIA   . MIGRAINE HEADACHE   . Hyperthyroidism     s/p I-131 ablation 03/2011 of multinod goiter  . Arthritis   . Stroke ([place])   . Diverticulosis     Past Surgical History  Procedure Laterality Date  . Abdominal hysterectomy  1976  . Total hip arthroplasty  1998     right  . Tonsillectomy    . Breast surgery      biopsy  . Radiology with anesthesia Left 03/08/2014    Procedure: RADIOLOGY WITH ANESTHESIA;  Surgeon: Rob Hickman, MD;  Location: [place];  Service: Radiology;  Laterality: Left;    Social History Social History  Substance Use Topics  . Smoking status: Never Smoker   . Smokeless tobacco: Never Used  . Alcohol Use: No    Family History Family History  Problem Relation Age of Onset  . Asthma Mother   . Asthma Father   . Prostate cancer Father   . Stroke Brother     Allergies  No Known Allergies   Current Outpatient Prescriptions  Medication Sig Dispense Refill  . acetaminophen (TYLENOL) 325 MG tablet Take 1-2 tablets (325-650 mg total) by mouth every 4 (four) hours as needed for mild pain.    [name] diclofenac sodium  (VOLTAREN) 1 % GEL Apply 2 g topically 3 (three) times daily. 100 g 11  . gabapentin (NEURONTIN) 100 MG capsule Take 1 capsule (100 mg total) by mouth 3 (three) times daily. 90 capsule 5  . Multiple Vitamins-Minerals (DECUBI-VITE) CAPS Take by mouth. Take 1 capsule by mouth daily for wound healing    . omeprazole (PRILOSEC) 20 MG capsule Take 1 capsule (20 mg total) by mouth daily. On an empty stomach 90 capsule 1  . Probiotic Product (ALIGN) 4 MG CAPS Take by mouth daily.      . propranolol (INDERAL) 10 MG tablet Take 10 mg by mouth as needed.    . senna-docusate (SENOKOT-S) 8.6-50 MG per tablet Take 2 tablets by mouth at bedtime.    . traMADol (ULTRAM) 50 MG tablet One po QID as needed for pain 120 tablet 5  . traZODone (DESYREL) 50 MG tablet Take 1 tablet (50 mg total) by mouth at bedtime as needed for sleep. 30 tablet 5  . warfarin (COUMADIN) 4 MG tablet Take 6 mg (1 tablet) on Mon/Wed/Fri. And take 4 mg (1 1/2 tablets) on Sun/Tues/Thurs/Sat. Or as directed. 40 tablet 2  . benzonatate (TESSALON) 200 MG capsule Take 1 capsule (200 mg total) by mouth 2 (two) times daily as needed for cough. (Patient not taking: Reported on 11/20/2014) 60  capsule 0  . FeAsp-B12-FA-C-DSS-SuccAc-Zn (FERIVA 21/7) 75-1 MG TABS Take 1 tablet by mouth daily. (Patient not taking: Reported on 11/20/2014) 28 tablet 11   No current facility-administered medications for this visit.    ROS:   unable to obtain patient has no verbal communication ability  Physical Examination  Filed Vitals:   11/21/14 1415  BP: 114/71  Pulse: 87  Temp: 98.3 F (36.8 C)  Resp: 14  Height: 5' (1.524 m)  Weight: 164 lb (74.39 kg)  SpO2: 97%    Body mass index is 32.03 kg/(m^2).  General:  Alert and oriented, no acute distress HEENT: Normal Neck: No bruit or JVD Pulmonary: Clear to auscultation bilaterally Cardiac: Regular Rate and Rhythm without murmur Abdomen: Soft, non-tender, non-distended, no mass Skin: No rash,  Feet  and dusky bilaterally Extremity Pulses:  2+ radial, brachial, femoral, dorsalis pedis pulses bilaterally Musculoskeletal: No deformity or edema  Neurologic: Upper and lower extremity motor 5/5 and symmetric  DATA:   Venous duplex from March 2016 was reviewed which showed bilateral femoral DVT   ASSESSMENT:   Bilateral lower extremity and occasional upper extremity duskiness of digits and plantar aspect of feet. Most likely this is secondary to dependency or possibly some level of venous hypertension after her previous DVT. This may also be due to loss of sympathetic tone from her prior stroke. No evidence of arterial occlusive disease with easily palpable lower extremity pulses and arterial pulses in her upper extremities.   PLAN:   Encourage the patient's daughter to have the patient wear lower extremity compression stockings to increase overall venous tone. Otherwise no follow-up necessary   Ruta Hinds, MD [facility] Office: [phone] Pager: [phone]

## 2014-11-22 ENCOUNTER — Ambulatory Visit: Payer: Medicare Other | Admitting: Speech Pathology

## 2014-11-22 DIAGNOSIS — R2681 Unsteadiness on feet: Secondary | ICD-10-CM | POA: Diagnosis not present

## 2014-11-22 DIAGNOSIS — M79605 Pain in left leg: Secondary | ICD-10-CM | POA: Diagnosis not present

## 2014-11-22 DIAGNOSIS — M623 Immobility syndrome (paraplegic): Secondary | ICD-10-CM | POA: Diagnosis not present

## 2014-11-22 DIAGNOSIS — R4701 Aphasia: Secondary | ICD-10-CM

## 2014-11-22 DIAGNOSIS — Z7409 Other reduced mobility: Secondary | ICD-10-CM | POA: Diagnosis not present

## 2014-11-22 DIAGNOSIS — I69351 Hemiplegia and hemiparesis following cerebral infarction affecting right dominant side: Secondary | ICD-10-CM | POA: Diagnosis not present

## 2014-11-22 DIAGNOSIS — M79602 Pain in left arm: Secondary | ICD-10-CM | POA: Diagnosis not present

## 2014-11-22 NOTE — Therapy (Signed)
[facility] [address] [place], [place], [address] Phone: [phone]   Fax:  [phone]  Speech Language Pathology Treatment  Patient Details  Name: Sandy Barrett MRN: [id number] Date of Birth: 05-Dec-1932 Dr. Lupe Carney  Encounter Date: 11/22/2014      End of Session - 11/22/14 1458    Visit Number 10   Number of Visits 25   Date for SLP Re-Evaluation 12/07/14   SLP Start Time 1025   SLP Stop Time  1448   SLP Time Calculation (min) 45 min   Activity Tolerance Patient tolerated treatment well      Past Medical History  Diagnosis Date  . HYPERTENSION   . HYPERTHYROIDISM   . OBESITY   . ARTHRITIS   . Posttraumatic stress disorder   . INSOMNIA   . MIGRAINE HEADACHE   . Hyperthyroidism     s/p I-131 ablation 03/2011 of multinod goiter  . Arthritis   . Stroke ([place])   . Diverticulosis     Past Surgical History  Procedure Laterality Date  . Abdominal hysterectomy  1976  . Total hip arthroplasty  1998     right  . Tonsillectomy    . Breast surgery      biopsy  . Radiology with anesthesia Left 03/08/2014    Procedure: RADIOLOGY WITH ANESTHESIA;  Surgeon: Rob Hickman, MD;  Location: [facility];  Service: Radiology;  Laterality: Left;    There were no vitals filed for this visit.  Visit Diagnosis: Expressive aphasia  Receptive aphasia      Subjective Assessment - 11/22/14 1403    Subjective stereotypical neolgisms   Patient is accompained by: Family member   Currently in Pain? No/denies               ADULT SLP TREATMENT - 11/22/14 1405    General Information   Behavior/Cognition Alert;Pleasant mood;Cooperative   Treatment Provided   Treatment provided Cognitive-Linquistic   Pain Assessment   Pain Assessment No/denies pain   Cognitive-Linquistic Treatment   Treatment focused on Apraxia;Aphasia   Skilled Treatment Auditory comprehension facilitated ID object f:2 to word,  gestures and semantic cue 50%, with added written word 50%. Automatic speech with cloze cues of opposites and common pairs with 65%, Lord's prayer, Amazing Grace 70%, Trained daughter to pair command with gesture. Trained daughter on cloze activities ;with [place] verses, idioms, pairs.    Assessment / Recommendations / Plan   Plan Continue with current plan of care   Progression Toward Goals   Progression toward goals Not progressing toward goals (comment)          SLP Education - 11/22/14 1455    Education provided Yes   Education Details Acitivites to faciliated language at home   Person(s) Educated Patient;Child(ren)   Methods Explanation;Demonstration;Verbal cues;Handout   Comprehension Verbalized understanding     Speech Therapy Progress Note  Dates of Reporting Period: 10/09/14  to 11/22/14  Objective Reports of Subjective Statement: Daughter reports minimal increase in spontaneous language a word level  Objective Measurements: Pt answering yes/no questions, comprehending at word level less than 50%, automatic speech 65% with max A  Goal Update: Continue goals focusing on family training for theraputic activities to do at home   Plan: D/C next session after family training completed  Reason Skilled Services are Required: Skilled ST to continue to maximize carryover of family providing language therapy at home       SLP Short Term Goals - 11/22/14  [place] #1   Title pt will receptively ID pictures or objects from f:3, given name of picture/object 30%   Time 1   Period Weeks   Status Not Met   SLP SHORT TERM GOAL #2   Title pt will definitively ID yes/no responses via communication board or head movements 40% of the time   Time 1   Period Weeks   Status Not Met   SLP SHORT TERM GOAL #3   Title pt will imitate consonant-vowel syllables/words with 40% success   Time 1   Period Weeks   Status Not Met   SLP SHORT TERM GOAL #4   Title pt will  perform automatic speech tasks independently with average 30% success including counting 1-20, DOW,    Time 1   Period Weeks   Status Not Met          SLP Long Term Goals - 11-30-2014 1458    SLP LONG TERM GOAL #1   Title pt will receptively ID pictures/objects from f:3 40% success   Time 3   Period Weeks   Status On-going   SLP LONG TERM GOAL #2   Title pt will perform activities at home with family as reported by family members at least 4 days per week   Time 3   Period Weeks   Status On-going   SLP LONG TERM GOAL #3   Title pt will ID yes/no responses with head movement or communication board with 50% of the time   Time 3   Period Weeks   Status Revised   SLP LONG TERM GOAL #4   Title pt will answer yes/no questions re: functional information with 90% success   Time 3   Period Weeks   Status On-going   SLP LONG TERM GOAL #5   Title pt will perform automatic speech tasks with average 40% success including DOW and counting 1-20   Time 3   Period Weeks   Status On-going   SLP LONG TERM GOAL #6   Title pt will use multimodal communication to expressively communicate in 70% of opportunities   Time 3   Period Weeks   Status On-going          Plan - 2014/11/30 1455    Clinical Impression Statement Profound aphasia persists with perseveration of stereotypical neolgisms. Auditory comprehension at word level with consistent max A. Automatic speech tasks with consistent to frequent max A. Continue skilled ST 1 more visit for family training.   Speech Therapy Frequency 3x / week   Duration 2 weeks   Treatment/Interventions Patient/family education;Multimodal communcation approach;Language facilitation;SLP instruction and feedback;Compensatory strategies;Cueing hierarchy;Functional tasks   Potential to Achieve Goals Fair   Potential Considerations Severity of impairments;Cooperation/participation level   Consulted and Agree with Plan of Care Family member/caregiver   Family  Member Consulted daughter          G-Codes - 30-Nov-2014 1459    Functional Limitations Spoken language expressive   Spoken Language Expression Current Status [phone]) At least 46 percent but less than 100 percent impaired, limited or restricted   Spoken Language Expression Goal Status [phone]) At least 54 percent but less than 100 percent impaired, limited or restricted      Problem List Patient Active Problem List   Diagnosis Date Noted  . Primary osteoarthritis of left knee 10/29/2014  . Urinary tract infectious disease 08/21/2014  . Primary osteoarthritis involving multiple joints 07/26/2014  . Pernicious anemia  07/26/2014  . Low TSH level 07/26/2014  . Hearing loss 07/26/2014  . Decreased vision in both eyes 07/26/2014  . Encounter for therapeutic drug monitoring 07/17/2014  . Spastic hemiparesis affecting dominant side ([place]) 05/31/2014  . Acute pulmonary embolism ([place]) 03/14/2014  . DVT of lower extremity, bilateral ([place]) 03/14/2014  . Global aphasia 03/14/2014  . 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 Dysphagia due to recent stroke 03/14/2014  . Aphasia due to stroke 03/13/2014  . Right hemiparesis ([place]) 03/13/2014  . Embolic stroke ([place])   . Cerebral infarction due to embolism of left middle cerebral artery ([place])   . Cerebral infarction due to unspecified mechanism   . Cerebral infarction due to thrombosis of right middle cerebral artery ([place])   . Stroke ([place]) 03/08/2014  . Pulmonary embolism ([place]) 03/08/2014  . CVA (cerebral infarction)   . Right sided weakness   . Back pain 08/29/2013  . Primary localized osteoarthrosis, lower leg 03/06/2013  . IBS (irritable bowel syndrome)   . 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 Multinodular goiter 01/13/2011  . Abnormal chest x-ray 12/12/2010  . HYPERTHYROIDISM 11/11/2009  . Insomnia 07/03/2009  . POSTTRAUMATIC STRESS DISORDER 08/22/2008  . OBESITY 04/21/2008  . Osteoarthritis 04/21/2008  . MIGRAINE HEADACHE 04/20/2008  .  Essential hypertension 04/20/2008    Lovvorn, Annye Rusk MS, CCC-SLP 11/22/2014, 3:05 PM  [facility] [address] [place], [place], [address] Phone: [phone]   Fax:  [phone]   Name: Sandy Barrett MRN: [id number] Date of Birth: 20-Jan-1932

## 2014-11-22 NOTE — Patient Instructions (Signed)
  Have her fill in the blank with rote passages she has memorized  Prayers, pledge, rhymes, common sayings, Bible verses  Amazing Shirlee [place], hymns  Use gestures paired with word/object to help understanding  Pair common objects with single word written in simple large print and have her complete sentence with the word  Opposites, pairs

## 2014-11-23 ENCOUNTER — Encounter: Payer: Self-pay | Admitting: Physical Therapy

## 2014-11-23 ENCOUNTER — Ambulatory Visit: Payer: Medicare Other | Admitting: Physical Therapy

## 2014-11-23 DIAGNOSIS — IMO0002 Reserved for concepts with insufficient information to code with codable children: Secondary | ICD-10-CM

## 2014-11-23 DIAGNOSIS — I69351 Hemiplegia and hemiparesis following cerebral infarction affecting right dominant side: Secondary | ICD-10-CM

## 2014-11-23 DIAGNOSIS — M2560 Stiffness of unspecified joint, not elsewhere classified: Secondary | ICD-10-CM

## 2014-11-23 DIAGNOSIS — M79605 Pain in left leg: Secondary | ICD-10-CM

## 2014-11-23 DIAGNOSIS — R2681 Unsteadiness on feet: Secondary | ICD-10-CM | POA: Diagnosis not present

## 2014-11-23 DIAGNOSIS — M623 Immobility syndrome (paraplegic): Secondary | ICD-10-CM | POA: Diagnosis not present

## 2014-11-23 DIAGNOSIS — R252 Cramp and spasm: Secondary | ICD-10-CM

## 2014-11-23 DIAGNOSIS — M79602 Pain in left arm: Secondary | ICD-10-CM | POA: Diagnosis not present

## 2014-11-23 DIAGNOSIS — Z7409 Other reduced mobility: Secondary | ICD-10-CM | POA: Diagnosis not present

## 2014-11-23 DIAGNOSIS — I69951 Hemiplegia and hemiparesis following unspecified cerebrovascular disease affecting right dominant side: Secondary | ICD-10-CM

## 2014-11-26 ENCOUNTER — Ambulatory Visit (INDEPENDENT_AMBULATORY_CARE_PROVIDER_SITE_OTHER): Payer: Medicare Other | Admitting: Internal Medicine

## 2014-11-26 ENCOUNTER — Telehealth: Payer: Self-pay | Admitting: Internal Medicine

## 2014-11-26 ENCOUNTER — Encounter: Payer: Self-pay | Admitting: Internal Medicine

## 2014-11-26 VITALS — BP 112/64 | HR 90 | Temp 98.0°F

## 2014-11-26 DIAGNOSIS — N950 Postmenopausal bleeding: Secondary | ICD-10-CM

## 2014-11-26 DIAGNOSIS — L89152 Pressure ulcer of sacral region, stage 2: Secondary | ICD-10-CM | POA: Insufficient documentation

## 2014-11-26 DIAGNOSIS — H6122 Impacted cerumen, left ear: Secondary | ICD-10-CM

## 2014-11-26 DIAGNOSIS — I639 Cerebral infarction, unspecified: Secondary | ICD-10-CM

## 2014-11-26 DIAGNOSIS — L602 Onychogryphosis: Secondary | ICD-10-CM | POA: Diagnosis not present

## 2014-11-26 DIAGNOSIS — Q845 Enlarged and hypertrophic nails: Secondary | ICD-10-CM

## 2014-11-26 DIAGNOSIS — H9190 Unspecified hearing loss, unspecified ear: Secondary | ICD-10-CM

## 2014-11-26 DIAGNOSIS — Z23 Encounter for immunization: Secondary | ICD-10-CM | POA: Diagnosis not present

## 2014-11-26 DIAGNOSIS — I2699 Other pulmonary embolism without acute cor pulmonale: Secondary | ICD-10-CM

## 2014-11-26 DIAGNOSIS — I63312 Cerebral infarction due to thrombosis of left middle cerebral artery: Secondary | ICD-10-CM

## 2014-11-26 MED ORDER — CICLOPIROX 8 % EX SOLN: Freq: Every day | CUTANEOUS | Status: DC

## 2014-11-26 NOTE — Progress Notes (Signed)
Subjective:    Patient ID: Sandy Barrett, female    DOB: 11-15-1932, 81 y.o.   MRN: [id number]  HPI  Patient here for follow up Also reviewed chronic medical conditions, interval events and current concerns  Past Medical History  Diagnosis Date  . HYPERTENSION   . HYPERTHYROIDISM   . OBESITY   . ARTHRITIS   . Posttraumatic stress disorder   . INSOMNIA   . MIGRAINE HEADACHE   . Hyperthyroidism     s/p I-131 ablation 03/2011 of multinod goiter  . Arthritis   . Stroke [facility]) 03/2014    dysarthria  . Diverticulosis   . Pulmonary embolism ([place]) 03/2014    with DVT    Review of Systems  Constitutional: Positive for fatigue. Negative for fever.  HENT: Positive for hearing loss (since 03/2014 CVA, leans forward to hear others). Negative for ear pain.   Respiratory: Negative for cough and shortness of breath.   Genitourinary: Positive for vaginal bleeding (?tan/brown DC, PMP and post hysterectomy - new n past 6 weeks despite tx for UTI and yeast - on anticoag) and difficulty urinating. Negative for genital sores and vaginal pain.  Skin: Positive for wound (B buttock, sacral and anterior thigh fold on RLE).  Neurological: Weakness: chronic R HP and dysarthria w/o change since 03/2014 CVA.       Objective:    Physical Exam  Constitutional: She is oriented to person, place, and time. She appears well-developed and well-nourished.  HENT:  Left ear with cerumen impaction. After irrigation, bilateral TMs clear  Cardiovascular: Normal rate, regular rhythm and intact distal pulses.   Murmur (2/6 systolic murmur) heard. Pulmonary/Chest: Effort normal and breath sounds normal. No respiratory distress. She has no wheezes.  Abdominal: Soft. There is no tenderness.  Musculoskeletal: She exhibits edema (Wearing compression stockings, slight edema).  Neurological: She is alert and oriented to person, place, and time.  Dysarthria, significant right arm and right leg weakness, unable to  ambulate  Skin: Skin is warm and dry.  <1cm multiple stage II open skin wounds decubitus in sacral and buttock area as review of family digital photos on phone today -not examined by me today due to non-ambulatory status and wheelchair bound    BP 112/64 mmHg  Pulse 90  Temp(Src) 98 F (36.7 C) (Oral)  Wt   SpO2 95% Wt Readings from Last 3 Encounters:  11/21/14 164 lb (74.39 kg)  07/06/14 171 lb 12.8 oz (77.928 kg)  05/31/14 164 lb 14.4 oz (74.798 kg)     Lab Results  Component Value Date   WBC 11.4* 08/21/2014   HGB 14.1 08/21/2014   HCT 44.1 08/21/2014   PLT 925.0* 08/21/2014   GLUCOSE 105* 08/21/2014   CHOL 95 03/09/2014   TRIG 54 03/09/2014   HDL 31* 03/09/2014   LDLCALC 53 03/09/2014   ALT 10 03/14/2014   AST 17 03/14/2014   NA 140 08/21/2014   K 3.9 08/21/2014   CL 103 08/21/2014   CREATININE 0.42 08/21/2014   BUN 14 08/21/2014   CO2 29 08/21/2014   TSH 0.25* 08/21/2014   INR 2.0 11/07/2014   HGBA1C 5.4 03/09/2014    US Breast [place] Axilla  08/14/2014  CLINICAL DATA:  81 year old female with intermittent focal pain in the upper-outer quadrant of the right breast. EXAM: DIGITAL DIAGNOSTIC BILATERAL MAMMOGRAM WITH 3D TOMOSYNTHESIS AND CAD COMPARISON:  No prior mammograms available for comparison. ACR Breast Density Category b: There are scattered areas of fibroglandular  density. FINDINGS: There is marked diffuse skin and trabecular thickening throughout the right breast. Nipple inversion is noted on the right side. No suspicious masses, calcifications or areas of distortion are visualized bilaterally. Mammographic images were processed with CAD. Physical exam of the right breast is limited by the patient's condition following stroke resulting in immobility and pain of the right upper extremity. The exam demonstrates diffuse edema and nipple inversion, corresponding with the mammographic findings. There is a focal area of discoloration of the upper inner  quadrant of the right breast. Palpation at the site of discoloration demonstrates an elongated rope-like palpable cord. Otherwise, there are no additional areas of erythema. No rashes are identified. Scarring is noted at the upper-outer quadrant of the right breast at the site of pain, corresponding with a remote benign excisional biopsy. Ultrasound at the site of pain in the upper-outer quadrant of the right breast at 11 o'clock demonstrates marked skin thickening, edema and tortuous dilated blood vessels. No masses or suspicious areas of shadowing are identified. Ultrasound targeted to the area of discoloration in the upper inner quadrant at 1 o'clock, 15 cm from the nipple demonstrates a dilated vein with absent internal blood flow. This vessel is incompletely compressible, compatible with thrombophlebitis. The axilla was unable to be imaged due to immobility and pain in the right shoulder following a recent stroke. IMPRESSION: 1. Marked skin and trabecular thickening throughout the right breast. Through history provided by the patient's daughter, the patient has had significant edema throughout the right upper extremity following her stroke in early 2016. This may explain associated right breast edema. 2. Nipple inversion of uncertain chronicity given lack of comparisons. 3. No mammographic or targeted sonographic abnormalities at the site of pain in the upper-outer quadrant of the right breast. 4. Thrombophlebitis (Mondoor's disease) is noted in an area of discoloration in the upper inner quadrant of the right breast. RECOMMENDATION: Clinical follow-up is recommended to evaluate and monitor the diffuse skin edema, nipple inversion and the discoloration in the upper inner quadrant of the right breast. If there is clinical concern for an inflammatory breast cancer, skin punch biopsy should be considered. I have discussed the findings and recommendations with the patient. Results were also provided in writing at  the conclusion of the visit. If applicable, a reminder letter will be sent to the patient regarding the next appointment. BI-RADS CATEGORY  2: Benign. Electronically Signed   By: Ammie Ferrier M.D.   On: 08/14/2014 14:24   Mm Diag Breast Tom[place] Bilateral  08/14/2014  CLINICAL DATA:  81 year old female with intermittent focal pain in the upper-outer quadrant of the right breast. EXAM: DIGITAL DIAGNOSTIC BILATERAL MAMMOGRAM WITH 3D TOMOSYNTHESIS AND CAD COMPARISON:  No prior mammograms available for comparison. ACR Breast Density Category b: There are scattered areas of fibroglandular density. FINDINGS: There is marked diffuse skin and trabecular thickening throughout the right breast. Nipple inversion is noted on the right side. No suspicious masses, calcifications or areas of distortion are visualized bilaterally. Mammographic images were processed with CAD. Physical exam of the right breast is limited by the patient's condition following stroke resulting in immobility and pain of the right upper extremity. The exam demonstrates diffuse edema and nipple inversion, corresponding with the mammographic findings. There is a focal area of discoloration of the upper inner quadrant of the right breast. Palpation at the site of discoloration demonstrates an elongated rope-like palpable cord. Otherwise, there are no additional areas of erythema. No rashes are identified. Scarring is noted  at the upper-outer quadrant of the right breast at the site of pain, corresponding with a remote benign excisional biopsy. Ultrasound at the site of pain in the upper-outer quadrant of the right breast at 11 o'clock demonstrates marked skin thickening, edema and tortuous dilated blood vessels. No masses or suspicious areas of shadowing are identified. Ultrasound targeted to the area of discoloration in the upper inner quadrant at 1 o'clock, 15 cm from the nipple demonstrates a dilated vein with absent internal blood flow. This vessel is  incompletely compressible, compatible with thrombophlebitis. The axilla was unable to be imaged due to immobility and pain in the right shoulder following a recent stroke. IMPRESSION: 1. Marked skin and trabecular thickening throughout the right breast. Through history provided by the patient's daughter, the patient has had significant edema throughout the right upper extremity following her stroke in early 2016. This may explain associated right breast edema. 2. Nipple inversion of uncertain chronicity given lack of comparisons. 3. No mammographic or targeted sonographic abnormalities at the site of pain in the upper-outer quadrant of the right breast. 4. Thrombophlebitis (Mondoor's disease) is noted in an area of discoloration in the upper inner quadrant of the right breast. RECOMMENDATION: Clinical follow-up is recommended to evaluate and monitor the diffuse skin edema, nipple inversion and the discoloration in the upper inner quadrant of the right breast. If there is clinical concern for an inflammatory breast cancer, skin punch biopsy should be considered. I have discussed the findings and recommendations with the patient. Results were also provided in writing at the conclusion of the visit. If applicable, a reminder letter will be sent to the patient regarding the next appointment. BI-RADS CATEGORY  2: Benign. Electronically Signed   By: Ammie Ferrier M.D.   On: 08/14/2014 14:24   Procedure: wax removal, L impaction cerumen Reason: wax impaction Risks and benefits of procedure discussed with the patient who agrees to proceed. L ear irrigated with warm water. Large amount of wax removed. Instrumentation with metal ear loop was performed to accomplish wax removal. the patient tolerated procedure well.     Assessment & Plan:   Problem List Items Addressed This Visit    Decubitus ulcer of sacral region, stage 2 - Primary    Small stage II wounds bilateral buttock and sacrum despite effort at  preventative management by family at home Resume home health for wound care recommendations and monitoring      Relevant Orders   Ambulatory referral to [place] loss   Relevant Orders   Ambulatory referral to Audiology   Pulmonary embolism [facility])    Diagnosis same in association with stroke March 2016 Chronic anticoagulation for this and embolic CVA -management per anticoagulation clinic as ongoing      Relevant Orders   Ambulatory referral to Gynecology   Stroke [facility])    Embolic event March [id number] reviewed Deficits include dense right hemiparesis, marked dysphasia Largely nonambulatory, subsequent sacral decubitus developed - new referral for home health to help manage same Continue medical management with supportive care as provided by daughters at home      Relevant Orders   Ambulatory referral to [place]   Ambulatory referral to Gynecology    Other Visit Diagnoses    Postmenopausal bleeding        Relevant Orders    Ambulatory referral to Gynecology    Thickened nails        Relevant Orders    Ambulatory referral to Podiatry  Left ear impacted cerumen            Gwendolyn Grant, MD

## 2014-11-26 NOTE — Assessment & Plan Note (Signed)
Small stage II wounds bilateral buttock and sacrum despite effort at preventative management by family at home Resume home health for wound care recommendations and monitoring

## 2014-11-26 NOTE — Telephone Encounter (Signed)
11.21.16 Pt's daughter is requesting a letter asking for leave of absence. Pt's daughter is named Sandy Barrett. Her sister, Candra Altheide, has a similar letter written by Dr. Ronnald Ramp. Please call when ready to be picked up. MS

## 2014-11-26 NOTE — Patient Instructions (Addendum)
It was good to see you today.  We have reviewed your prior records including labs and tests today  Annual flu shot administered today  Your ear has been irrigated of wax today -let us know if continued hearing problems persist   Medications reviewed and updated, no changes recommended at this time. Refill on medication(s) as discussed today.  we'll make referral to gynecology for evaluation of possible bleeding and discharge, podiatry for assistance with toenail care, audiology for hearing evaluation and home health for skin care. Our office will contact you regarding appointment(s) once made.  Please schedule followup in 3-4 months with Dr Quay Burow as planned, call sooner if problems.   Cerumen Impaction The structures of the external ear canal secrete a waxy substance known as cerumen. Excess cerumen can build up in the ear canal, causing a condition known as cerumen impaction. Cerumen impaction can cause ear pain and disrupt the function of the ear. The rate of cerumen production differs for each individual. In certain individuals, the configuration of the ear canal may decrease his or her ability to naturally remove cerumen. CAUSES Cerumen impaction is caused by excessive cerumen production or buildup. RISK FACTORS  Frequent use of swabs to clean ears.  Having narrow ear canals.  Having eczema.  Being dehydrated. SIGNS AND SYMPTOMS  Diminished hearing.  Ear drainage.  Ear pain.  Ear itch. TREATMENT Treatment may involve:  Over-the-counter or prescription ear drops to soften the cerumen.  Removal of cerumen by a health care provider. This may be done with:  Irrigation with warm water. This is the most common method of removal.  Ear curettes and other instruments.  Surgery. This may be done in severe cases. HOME CARE INSTRUCTIONS  Take medicines only as directed by your health care provider.  Do not insert objects into the ear with the intent of cleaning the  ear. PREVENTION  Do not insert objects into the ear, even with the intent of cleaning the ear. Removing cerumen as a part of normal hygiene is not necessary, and the use of swabs in the ear canal is not recommended.  Drink enough water to keep your urine clear or pale yellow.  Control your eczema if you have it. SEEK MEDICAL CARE IF:  You develop ear pain.  You develop bleeding from the ear.  The cerumen does not clear after you use ear drops as directed.   This information is not intended to replace advice given to you by your health care provider. Make sure you discuss any questions you have with your health care provider.   Document Released: 01/30/2004 Document Revised: 01/12/2014 Document Reviewed: 08/08/2014 Elsevier Interactive Patient Education [employer].

## 2014-11-26 NOTE — Therapy (Signed)
[facility] [address] [place], [place], [address] Phone: [phone]   Fax:  [phone]  Physical Therapy Treatment  Patient Details  Name: Sandy Barrett MRN: [id number] Date of Birth: 15-Jun-1932 Referring Provider: Alysia Penna, MD  Encounter Date: 11/23/2014    11/23/14 1234  PT Visits / Re-Eval  Visit Number 14  Number of Visits 27 (per PT recert)  Date for PT Re-Evaluation 20/81/38 (per PT recert)  Authorization  Authorization Type Medicare G-Code every 10th visit  PT Time Calculation  PT Start Time 1230  PT Stop Time 1315  PT Time Calculation (min) 45 min  PT - End of Session  Equipment Utilized During Treatment Gait belt  Activity Tolerance Patient tolerated treatment well  Behavior During Therapy [facility] for tasks assessed/performed     Past Medical History  Diagnosis Date  . HYPERTENSION   . HYPERTHYROIDISM   . OBESITY   . ARTHRITIS   . Posttraumatic stress disorder   . INSOMNIA   . MIGRAINE HEADACHE   . Hyperthyroidism     s/p I-131 ablation 03/2011 of multinod goiter  . Arthritis   . Stroke [facility]) 03/2014    dysarthria  . Diverticulosis   . Pulmonary embolism ([place]) 03/2014    with DVT    Past Surgical History  Procedure Laterality Date  . Abdominal hysterectomy  1976  . Total hip arthroplasty  1998     right  . Tonsillectomy    . Breast surgery      biopsy  . Radiology with anesthesia Left 03/08/2014    Procedure: RADIOLOGY WITH ANESTHESIA;  Surgeon: Rob Hickman, MD;  Location: [facility];  Service: Radiology;  Laterality: Left;    There were no vitals filed for this visit.  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 No falls. Daughter reports they are doing the exercises every day without any issues.  Patient is accompained by: Family member (daughter [name])  Pain Assessment  Currently in Pain? No/denies  Pain Score 0    Treatment: Slide board transfer wheelchair to mat table toward left side with min to mod assist. facilitaiton needed for anterior weight shifting and for use of left UE/leg to assist with scooting.  Seated edge of mat: Blocked practice for anterior<>posterior reciprocal scooting. Min assist with mod to max cues, facilitation for weight shifting to allow for scooting.   With therapist seated in chair in front of pt.: sit<>stand x 3 reps. Pt. Pulling up on chair arm rest, right knee blocked to prevent buckling and cues/facilitation for anterior weight shifting and hip extension once in partial to full standing. Full standing achieved on 1st rep with pt reporting increased hip/knee pain. Partial stands on next 2 reps with emphasis on anterior weight shifting to clear buttocks for carryover into scooting.   Repeated blocked practice of anterior<>posterior scooting for carryover of above activity (forward weight shifting to assist with clearing buttocks). Min assist with cues as above.  Lateral scooting needed mod assist with cues and facilitation for weight shifting to allow for pelvic movement along edge of mat. 4-5 reps each way.  PT Short Term Goals - 11/14/14 1843    PT SHORT TERM GOAL #1   Title Family demonstrates updated, initial HEP correctly with minimal cues. (Target Date: 10/18/2014)   Baseline MET 10/23/2014 with HEP instructed to date.   Time 4   Period Weeks   Status Achieved   PT SHORT TERM GOAL #2   Title Sliding Board Transfers w/c to level surface with maximal assist.  (Target Date: 10/18/2014)   Baseline MET 10/23/2014    Time 4   Period Weeks   Status Achieved   PT SHORT TERM GOAL #3   Title Patient performs sitting balance at edge of mat reaching 2" anteriorly & left and across midline with left UE with contact assist.  (Target Date: 10/18/2014)   Baseline MET 10/23/2014   Time 4   Period Weeks   Status Achieved   PT SHORT TERM GOAL #4   Title Daughter demonstrates sliding board transfer with her mother with PT instruction & minA. (Target Date: 12/14/2014)   Time 1   Period Months   Status New   PT SHORT TERM GOAL #5   Title Sit to stand pulling on parallel bar or sink with maximal assist. (Target Date: 12/14/2014)   Time 1   Period Months   Status New   PT SHORT TERM GOAL #6   Title Patient able to tolerate standing at parallel bar for 60 seconds with maximal assist (Target Date: 12/14/2014)   Time 1   Period Months   Status New           PT Long Term Goals - 11/14/14 1921    PT LONG TERM GOAL #1   Title Family demonstrates updated HEP and verbalizes benefits for decreasing LE pain associated with stiffness.  (NEW Target Date: 01/11/2015)   Baseline NOT MET 11/13/2014 Daughter understands basic HEP but needs instruction in updated HEP   Time 8   Period Weeks   Status On-going   PT LONG TERM GOAL #2   Title Family performs transfer with patient with sliding board safely. (NEW Target Date: 01/11/2015)   Baseline NOT MET 11/13/2014 Patient's daughter needs further instruction for sliding board transfer w/c to/from mat.    Time 8   Period Weeks   Status Revised   PT LONG TERM GOAL #3   Title Patient able to reach 5" anteriorly, left & across midline with left UE sitting at edge of mat or w/c with feet on floor with supervision.  (Target Date: 11/16/2014)   Baseline MET 11/13/2014   Time 8   Period Weeks   Status Achieved   PT LONG TERM GOAL #4   Title Patient able to roll left & right with rail and boost buttocks in bed to aide with self-care with cues only.   (NEW Target Date: 01/11/2015)    Baseline NOT MET 11/9, as pt requires (S) to roll R, min A to roll L, and up to Mod A for boosting/bridging.   Time 8   Period Weeks   Status Not Met   PT LONG TERM GOAL #5   Title Patient sit to stand pulling on parallel bar with moderate assist and maintain standing for 2 minutes with MinA.  (NEW Target Date: 01/11/2015)   Time 8   Period Weeks   Status New        11/23/14 1234  Plan  Clinical Impression Statement Today's session focused on scooting and anterior weight shifting activites. Pt continues to need increased time,  facilitaiton and cueing with all activiites. Less overall assistance needed today. Pt making steady  progress toward goals.  Pt will benefit from skilled therapeutic intervention in order to improve on the following deficits Abnormal gait;Decreased activity tolerance;Decreased balance;Decreased mobility;Decreased range of motion;Decreased strength;Pain  Rehab Potential Good  PT Frequency 2x / week  PT Duration 8 weeks  PT Treatment/Interventions ADLs/Self Care Home Management;DME Instruction;Functional mobility training;Therapeutic activities;Therapeutic exercise;Balance training;Neuromuscular re-education;Patient/family education;Wheelchair mobility training  PT Next Visit Plan Work on pt initiating full anterior weight shift to promote independence with scooting, transfers. continue to work on fuctional standing for assist/easement of ADL's.   Consulted and Agree with Plan of Care Patient;Family member/caregiver  Family Member Consulted daughter, [name]       Problem List Patient Active Problem List   Diagnosis Date Noted  . Decubitus ulcer of sacral region, stage 2 11/26/2014  . Primary osteoarthritis of left knee 10/29/2014  . Primary osteoarthritis involving multiple joints 07/26/2014  . Pernicious anemia 07/26/2014  . Hearing loss 07/26/2014  . Encounter for therapeutic drug monitoring 07/17/2014  . Spastic hemiparesis affecting dominant side ([place])  05/31/2014  . DVT of lower extremity, bilateral ([place]) 03/14/2014  . Global aphasia 03/14/2014  . 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 Dysphagia due to recent stroke 03/14/2014  . Aphasia due to stroke 03/13/2014  . Right hemiparesis ([place]) 03/13/2014  . Embolic stroke ([place])   . Cerebral infarction due to embolism of left middle cerebral artery ([place])   . Cerebral infarction due to thrombosis of right middle cerebral artery ([place])   . Stroke ([place]) 03/08/2014  . Pulmonary embolism ([place]) 03/08/2014  . Back pain 08/29/2013  . Primary localized osteoarthrosis, lower leg 03/06/2013  . IBS (irritable bowel syndrome)   . Multinodular goiter 01/13/2011  . HYPERTHYROIDISM 11/11/2009  . Insomnia 07/03/2009  . POSTTRAUMATIC STRESS DISORDER 08/22/2008  . OBESITY 04/21/2008  . Osteoarthritis 04/21/2008  . MIGRAINE HEADACHE 04/20/2008  . Essential hypertension 04/20/2008    Willow Ora 11/26/2014, 1:36 PM  Willow Ora, PTA, [facility] [address], [place], [place] [address] [phone] 11/26/2014, 1:36 PM   Name: Sandy Barrett MRN: [id number] Date of Birth: [birth year]

## 2014-11-26 NOTE — Assessment & Plan Note (Signed)
Embolic event March Q000111Q reviewed Deficits include dense right hemiparesis, marked dysphasia Largely nonambulatory, subsequent sacral decubitus developed - new referral for home health to help manage same Continue medical management with supportive care as provided by daughters at home

## 2014-11-26 NOTE — Progress Notes (Signed)
Pre visit review using our clinic review tool, if applicable. No additional management support is needed unless otherwise documented below in the visit note. 

## 2014-11-26 NOTE — Assessment & Plan Note (Signed)
Diagnosis same in association with stroke March 2016 Chronic anticoagulation for this and embolic CVA -management per anticoagulation clinic as ongoing

## 2014-11-27 ENCOUNTER — Telehealth: Payer: Self-pay

## 2014-11-27 ENCOUNTER — Ambulatory Visit: Payer: Medicare Other | Admitting: Physical Therapy

## 2014-11-27 ENCOUNTER — Ambulatory Visit: Payer: Medicare Other | Admitting: Occupational Therapy

## 2014-11-27 NOTE — Telephone Encounter (Signed)
Letter created, stamped with PCP signature. [name] contacted and vmm left that letter is ready for pick up.

## 2014-11-27 NOTE — Telephone Encounter (Signed)
Call regarding questions about care.

## 2014-11-27 NOTE — Telephone Encounter (Signed)
Ok to create this letter as per example done by Dr. Ronnald Ramp for other family member Thanks!

## 2014-11-28 ENCOUNTER — Encounter: Payer: Self-pay | Admitting: Physical Therapy

## 2014-11-28 ENCOUNTER — Ambulatory Visit: Payer: Medicare Other | Admitting: Physical Therapy

## 2014-11-28 DIAGNOSIS — M79605 Pain in left leg: Secondary | ICD-10-CM | POA: Diagnosis not present

## 2014-11-28 DIAGNOSIS — I69351 Hemiplegia and hemiparesis following cerebral infarction affecting right dominant side: Secondary | ICD-10-CM | POA: Diagnosis not present

## 2014-11-28 DIAGNOSIS — R2681 Unsteadiness on feet: Secondary | ICD-10-CM

## 2014-11-28 DIAGNOSIS — M79602 Pain in left arm: Secondary | ICD-10-CM | POA: Diagnosis not present

## 2014-11-28 DIAGNOSIS — Z7409 Other reduced mobility: Secondary | ICD-10-CM | POA: Diagnosis not present

## 2014-11-28 DIAGNOSIS — M2560 Stiffness of unspecified joint, not elsewhere classified: Secondary | ICD-10-CM

## 2014-11-28 DIAGNOSIS — M623 Immobility syndrome (paraplegic): Secondary | ICD-10-CM | POA: Diagnosis not present

## 2014-11-28 DIAGNOSIS — R252 Cramp and spasm: Secondary | ICD-10-CM

## 2014-11-28 NOTE — Therapy (Signed)
[facility] [address] [place], [place], [address] Phone: [phone]   Fax:  [phone]  Physical Therapy Treatment  Patient Details  Name: Sandy Barrett MRN: [id number] Date of Birth: 10/11/1932 Referring Provider: Alysia Penna, MD  Encounter Date: 11/28/2014      PT End of Session - 11/28/14 1304    Visit Number 15   Number of Visits 27  per PT recert   Date for PT Re-Evaluation 11/01/23  per PT recert   Authorization Type Medicare G-Code every 10th visit   PT Start Time 1145   PT Stop Time 1230   PT Time Calculation (min) 45 min   Equipment Utilized During Treatment Gait belt   Activity Tolerance Patient tolerated treatment well   Behavior During Therapy [facility] for tasks assessed/performed      Past Medical History  Diagnosis Date  . HYPERTENSION   . HYPERTHYROIDISM   . OBESITY   . ARTHRITIS   . Posttraumatic stress disorder   . INSOMNIA   . MIGRAINE HEADACHE   . Hyperthyroidism     s/p I-131 ablation 03/2011 of multinod goiter  . Arthritis   . Stroke [facility]) 03/2014    dysarthria  . Diverticulosis   . Pulmonary embolism ([place]) 03/2014    with DVT    Past Surgical History  Procedure Laterality Date  . Abdominal hysterectomy  1976  . Total hip arthroplasty  1998     right  . Tonsillectomy    . Breast surgery      biopsy  . Radiology with anesthesia Left 03/08/2014    Procedure: RADIOLOGY WITH ANESTHESIA;  Surgeon: Rob Hickman, MD;  Location: [facility];  Service: Radiology;  Laterality: Left;    There were no vitals filed for this visit.  Visit Diagnosis:  Unsteadiness  Decreased transfer ability  Spasticity  Stiffness due to immobility      Subjective Assessment - 11/28/14 1156    Subjective Daughter reports they are doing the exercises.    Patient is accompained by: Family member   Currently in Pain? Other (Comment)  aphasia makes assessment difficult dtr reports  non-verbals for UE > LE pain with movements.       Therapeutic Activities: Daughter, Sandy Barrett, observing with PT cueing her to how & why assisting Sandy Barrett during activities to facilitate more carryover to home situation.  Scooting forward to edge of w/c with lateral trunk lean to unweight each side of pelvis, tactile cues under LE of pelvic side shifting forward: MinA to scoot left side forward and ModA (total assist to support LE) to scoot right side forward. Sliding board transfer w/c to mat (1" higher) to pt's left with moderate assist with tactile & verbal cues on wt shift over feet, flexed trunk. Sitting balance: lateral trunk lean to right & left with manual / tactile cues. Anterior wt shift forward sliding hand on chair bottom (positioned in front of pt). Partial sit to stand pushing left UE on chair bottom with chair blocking knee flexion with 1 person facilitating at posteriorly for pelvic anterior & upward lift, second person assisting on pt's right side for trunk flexion and wt shift over feet. Patient performed ~10-15% of partial stand activity for 5 reps with increased height of pelvic rise each rep. Final rep with pelvis ~50% to full stand position.  Sliding board transfer mat to w/c (1" lower) to patient's right side, daughter performing assistance from front (PT verbal & tactile cued her positioning &  hand placement) and PT tactile cueing at patient's buttocks with minA (pt performed 80-90% of transfer.                            PT Education - 11/28/14 1301    Education provided Yes   Education Details forward lean to chair positioned in front for wt shift   Person(s) Educated Patient;Child(ren)   Methods Explanation;Demonstration;Tactile cues;Verbal cues   Comprehension Verbalized understanding;Returned demonstration;Verbal cues required;Tactile cues required          PT Short Term Goals - 11/14/14 1843    PT SHORT TERM GOAL #1   Title Family  demonstrates updated, initial HEP correctly with minimal cues. (Target Date: 10/18/2014)   Baseline MET 10/23/2014 with HEP instructed to date.   Time 4   Period Weeks   Status Achieved   PT SHORT TERM GOAL #2   Title Sliding Board Transfers w/c to level surface with maximal assist.  (Target Date: 10/18/2014)   Baseline MET 10/23/2014   Time 4   Period Weeks   Status Achieved   PT SHORT TERM GOAL #3   Title Patient performs sitting balance at edge of mat reaching 2" anteriorly & left and across midline with left UE with contact assist.  (Target Date: 10/18/2014)   Baseline MET 10/23/2014   Time 4   Period Weeks   Status Achieved   PT SHORT TERM GOAL #4   Title Daughter demonstrates sliding board transfer with her mother with PT instruction & minA. (Target Date: 12/14/2014)   Time 1   Period Months   Status New   PT SHORT TERM GOAL #5   Title Sit to stand pulling on parallel bar or sink with maximal assist. (Target Date: 12/14/2014)   Time 1   Period Months   Status New   PT SHORT TERM GOAL #6   Title Patient able to tolerate standing at parallel bar for 60 seconds with maximal assist (Target Date: 12/14/2014)   Time 1   Period Months   Status New           PT Long Term Goals - 11/14/14 1921    PT LONG TERM GOAL #1   Title Family demonstrates updated HEP and verbalizes benefits for decreasing LE pain associated with stiffness.  (NEW Target Date: 01/11/2015)   Baseline NOT MET 11/13/2014 Daughter understands basic HEP but needs instruction in updated HEP   Time 8   Period Weeks   Status On-going   PT LONG TERM GOAL #2   Title Family performs transfer with patient with sliding board safely. (NEW Target Date: 01/11/2015)   Baseline NOT MET 11/13/2014 Patient's daughter needs further instruction for sliding board transfer w/c to/from mat.    Time 8   Period Weeks   Status Revised   PT LONG TERM GOAL #3   Title Patient able to reach 5" anteriorly, left & across midline with left  UE sitting at edge of mat or w/c with feet on floor with supervision.  (Target Date: 11/16/2014)   Baseline MET 11/13/2014   Time 8   Period Weeks   Status Achieved   PT LONG TERM GOAL #4   Title Patient able to roll left & right with rail and boost buttocks in bed to aide with self-care with cues only.   (NEW Target Date: 01/11/2015)   Baseline NOT MET 11/9, as pt requires (S) to roll R, min A to roll  L, and up to Mod A for boosting/bridging.   Time 8   Period Weeks   Status Not Met   PT LONG TERM GOAL #5   Title Patient sit to stand pulling on parallel bar with moderate assist and maintain standing for 2 minutes with MinA.  (NEW Target Date: 01/11/2015)   Time 8   Period Weeks   Status New               Plan - 11/28/14 1306    Clinical Impression Statement Patient's daughter was able assist transfer to pt's right downhill (bed 1" higher than w/c) with minimal assist with PT cueing dtrs positioning. Patient continues to express left LE pain with movements but tolerates activities better today.    Pt will benefit from skilled therapeutic intervention in order to improve on the following deficits Abnormal gait;Decreased activity tolerance;Decreased balance;Decreased mobility;Decreased range of motion;Decreased strength;Pain   Rehab Potential Good   PT Frequency 2x / week   PT Duration 8 weeks   PT Treatment/Interventions ADLs/Self Care Home Management;DME Instruction;Functional mobility training;Therapeutic activities;Therapeutic exercise;Balance training;Neuromuscular re-education;Patient/family education;Wheelchair mobility training   PT Next Visit Plan Work on pt initiating full anterior weight shift to promote independence with scooting, transfers. continue to work on fuctional standing for assist/easement of ADL's.    Consulted and Agree with Plan of Care Patient;Family member/caregiver   Family Member Consulted daughter, Sandy Barrett        Problem List Patient Active Problem List    Diagnosis Date Noted  . Decubitus ulcer of sacral region, stage 2 11/26/2014  . Primary osteoarthritis of left knee 10/29/2014  . Primary osteoarthritis involving multiple joints 07/26/2014  . Pernicious anemia 07/26/2014  . Hearing loss 07/26/2014  . Encounter for therapeutic drug monitoring 07/17/2014  . Spastic hemiparesis affecting dominant side ([place]) 05/31/2014  . DVT of lower extremity, bilateral ([place]) 03/14/2014  . Global aphasia 03/14/2014  . 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 Dysphagia due to recent stroke 03/14/2014  . Aphasia due to stroke 03/13/2014  . Right hemiparesis ([place]) 03/13/2014  . Embolic stroke ([place])   . Cerebral infarction due to embolism of left middle cerebral artery ([place])   . Cerebral infarction due to thrombosis of right middle cerebral artery ([place])   . Stroke ([place]) 03/08/2014  . Pulmonary embolism ([place]) 03/08/2014  . Back pain 08/29/2013  . Primary localized osteoarthrosis, lower leg 03/06/2013  . IBS (irritable bowel syndrome)   . Multinodular goiter 01/13/2011  . HYPERTHYROIDISM 11/11/2009  . Insomnia 07/03/2009  . POSTTRAUMATIC STRESS DISORDER 08/22/2008  . OBESITY 04/21/2008  . Osteoarthritis 04/21/2008  . MIGRAINE HEADACHE 04/20/2008  . Essential hypertension 04/20/2008    Jamey Reas PT, DPT 11/28/2014, 1:14 PM  [facility] [address] [place], [place], [address] Phone: [phone]   Fax:  [phone]  Name: Sandy Barrett MRN: [id number] Date of Birth: March 04, 1932

## 2014-11-30 DIAGNOSIS — I69351 Hemiplegia and hemiparesis following cerebral infarction affecting right dominant side: Secondary | ICD-10-CM | POA: Diagnosis not present

## 2014-11-30 DIAGNOSIS — I69328 Other speech and language deficits following cerebral infarction: Secondary | ICD-10-CM | POA: Diagnosis not present

## 2014-11-30 DIAGNOSIS — H6122 Impacted cerumen, left ear: Secondary | ICD-10-CM | POA: Diagnosis not present

## 2014-11-30 DIAGNOSIS — N950 Postmenopausal bleeding: Secondary | ICD-10-CM | POA: Diagnosis not present

## 2014-11-30 DIAGNOSIS — I2699 Other pulmonary embolism without acute cor pulmonale: Secondary | ICD-10-CM | POA: Diagnosis not present

## 2014-11-30 DIAGNOSIS — L989 Disorder of the skin and subcutaneous tissue, unspecified: Secondary | ICD-10-CM | POA: Diagnosis not present

## 2014-11-30 DIAGNOSIS — L602 Onychogryphosis: Secondary | ICD-10-CM | POA: Diagnosis not present

## 2014-11-30 DIAGNOSIS — H9190 Unspecified hearing loss, unspecified ear: Secondary | ICD-10-CM | POA: Diagnosis not present

## 2014-11-30 DIAGNOSIS — Z7901 Long term (current) use of anticoagulants: Secondary | ICD-10-CM | POA: Diagnosis not present

## 2014-11-30 DIAGNOSIS — Z9181 History of falling: Secondary | ICD-10-CM | POA: Diagnosis not present

## 2014-12-03 ENCOUNTER — Encounter: Payer: Self-pay | Admitting: Occupational Therapy

## 2014-12-03 ENCOUNTER — Ambulatory Visit: Payer: Medicare Other | Admitting: Occupational Therapy

## 2014-12-03 DIAGNOSIS — M79602 Pain in left arm: Secondary | ICD-10-CM | POA: Diagnosis not present

## 2014-12-03 DIAGNOSIS — R4189 Other symptoms and signs involving cognitive functions and awareness: Secondary | ICD-10-CM

## 2014-12-03 DIAGNOSIS — R2681 Unsteadiness on feet: Secondary | ICD-10-CM | POA: Diagnosis not present

## 2014-12-03 DIAGNOSIS — R201 Hypoesthesia of skin: Secondary | ICD-10-CM

## 2014-12-03 DIAGNOSIS — R600 Localized edema: Secondary | ICD-10-CM

## 2014-12-03 DIAGNOSIS — M79641 Pain in right hand: Secondary | ICD-10-CM

## 2014-12-03 DIAGNOSIS — I69351 Hemiplegia and hemiparesis following cerebral infarction affecting right dominant side: Secondary | ICD-10-CM | POA: Diagnosis not present

## 2014-12-03 DIAGNOSIS — I69951 Hemiplegia and hemiparesis following unspecified cerebrovascular disease affecting right dominant side: Secondary | ICD-10-CM

## 2014-12-03 DIAGNOSIS — M25511 Pain in right shoulder: Secondary | ICD-10-CM

## 2014-12-03 DIAGNOSIS — M79605 Pain in left leg: Secondary | ICD-10-CM | POA: Diagnosis not present

## 2014-12-03 DIAGNOSIS — M623 Immobility syndrome (paraplegic): Secondary | ICD-10-CM | POA: Diagnosis not present

## 2014-12-03 DIAGNOSIS — M25621 Stiffness of right elbow, not elsewhere classified: Secondary | ICD-10-CM

## 2014-12-03 DIAGNOSIS — R414 Neurologic neglect syndrome: Secondary | ICD-10-CM

## 2014-12-03 DIAGNOSIS — Z7409 Other reduced mobility: Secondary | ICD-10-CM | POA: Diagnosis not present

## 2014-12-03 NOTE — Therapy (Signed)
[facility] [address] [place], [place], [address] Phone: [phone]   Fax:  [phone]  Occupational Therapy Treatment  Patient Details  Name: Sandy Barrett MRN: [id number] Date of Birth: 1932/06/26 No Data Recorded  Encounter Date: 12/03/2014      OT End of Session - 12/03/14 1705    Visit Number 12   Number of Visits 16   Date for OT Re-Evaluation 01/01/15   Authorization Type medicare - pt will need G code and progress note every 10th visit   Authorization Time Period G code every 10 visits   OT Start Time 1617   OT Stop Time 1655   OT Time Calculation (min) 38 min   Activity Tolerance Patient tolerated treatment well      Past Medical History  Diagnosis Date  . HYPERTENSION   . HYPERTHYROIDISM   . OBESITY   . ARTHRITIS   . Posttraumatic stress disorder   . INSOMNIA   . MIGRAINE HEADACHE   . Hyperthyroidism     s/p I-131 ablation 03/2011 of multinod goiter  . Arthritis   . Stroke [facility]) 03/2014    dysarthria  . Diverticulosis   . Pulmonary embolism ([place]) 03/2014    with DVT    Past Surgical History  Procedure Laterality Date  . Abdominal hysterectomy  1976  . Total hip arthroplasty  1998     right  . Tonsillectomy    . Breast surgery      biopsy  . Radiology with anesthesia Left 03/08/2014    Procedure: RADIOLOGY WITH ANESTHESIA;  Surgeon: Rob Hickman, MD;  Location: [facility];  Service: Radiology;  Laterality: Left;    There were no vitals filed for this visit.  Visit Diagnosis:  Hemiplegia affecting right side in right-dominant patient as late effect of cerebrovascular disease ([place])  Pain of right hand  Impaired cognition  Right-sided visual neglect  Localized edema  Stiffness of joint, upper arm, right  Pain in joint, shoulder region, right  Impaired sensation      Subjective Assessment - 12/03/14 1625    Subjective  Pt doing thumbs up when asked how she is doing    Patient is accompained by: Family member  dtr   Pertinent History see epic snapshot,    Patient Stated Goals Pt unable to state due to global aphasia;  dtr states to improve in any way she can especially mobility, reduce pain   Currently in Pain? --  dtr reports pain overall much improved - pt hs times here and there with RUE pain but rare and tolerating exercises well.                                OT Education - 12/03/14 1704    Education provided Yes   Education Details use of lap tray   Person(s) Educated Patient;Child(ren)   Methods Explanation;Demonstration   Comprehension Verbalized understanding;Returned demonstration          OT Short Term Goals - 12/03/14 1704    OT SHORT TERM GOAL #1   Title Pt and dtr will be mod I with HEP - 10/16/2014   Status Achieved   OT SHORT TERM GOAL #2   Title Pt will tolerate UE positioning in wheelchair and bed to protect shoulder girdle, reduce edema and reduce pain - 12/04/2014   Status Achieved  awating desk length arm rests for wheelchair  OT SHORT TERM GOAL #3   Title Pt's dtr will be I with retrograde massage to RUE   Status Achieved   OT SHORT TERM GOAL #4   Title Pt will be able to sit EOM wth supervision only   Status Achieved   OT SHORT TERM GOAL #5   Title Pt and dtr will be mod I with splint wear and care   Status Achieved           OT Long Term Goals - 12-19-14 1704    OT LONG TERM GOAL #1   Title Pt and dtr will be mod I with upgraded HEP prn - 11/13/2014   Status Achieved   OT LONG TERM GOAL #2   Title Pt will be able to wash face with min a   Status Achieved   OT LONG TERM GOAL #3   Title Pt will be able to eat at least 50% of her meal with utensil and min a - 12/04/2014   Status Achieved   OT LONG TERM GOAL #4   Title Pt will require mod a to transfer to drop arm commode   Status Achieved               Plan - 12-19-2014 1704    Clinical Impression Statement Pt's dtr has  finally obtained full length arm rests snd session focused on use of tray and positiong of RUE on tray today. Pt has now met all STG and LTG's   Pt will benefit from skilled therapeutic intervention in order to improve on the following deficits (Retired) Decreased activity tolerance;Decreased balance;Decreased cognition;Decreased knowledge of use of DME;Decreased mobility;Decreased range of motion;Decreased safety awareness;Decreased strength;Increased edema;Impaired UE functional use;Impaired tone;Impaired sensation;Impaired flexibility;Impaired vision/preception;Pain   Rehab Potential Fair   Clinical Impairments Affecting Rehab Potential severity of deficits   OT Frequency 2x / week   OT Duration 8 weeks   OT Treatment/Interventions Self-care/ADL training;Ultrasound;Moist Heat;Contrast Bath;DME and/or AE instruction;Neuromuscular education;Therapeutic exercise;Functional Mobility Training;Manual Therapy;Passive range of motion;Splinting;Therapeutic activities;Balance training;Patient/family education;Visual/perceptual remediation/compensation;Cognitive remediation/compensation   Plan d/c from OT   Consulted and Agree with Plan of Care Patient;Family member/caregiver   Family Member Consulted dtr          G-Codes - Dec 19, 2014 1707    Functional Assessment Tool Used clinical observation as pt is unable to participate in any standardized tests   Functional Limitation Self care   Self Care Current Status [phone]) At least 80 percent but less than 100 percent impaired, limited or restricted   Self Care Goal Status (Z6010) At least 80 percent but less than 100 percent impaired, limited or restricted   Self Care Discharge Status [id number]) At least 80 percent but less than 100 percent impaired, limited or restricted      Problem List Patient Active Problem List   Diagnosis Date Noted  . Decubitus ulcer of sacral region, stage 2 11/26/2014  . Primary osteoarthritis of left knee 10/29/2014  . Primary  osteoarthritis involving multiple joints 07/26/2014  . Pernicious anemia 07/26/2014  . Hearing loss 07/26/2014  . Encounter for therapeutic drug monitoring 07/17/2014  . Spastic hemiparesis affecting dominant side ([place]) 05/31/2014  . DVT of lower extremity, bilateral ([place]) 03/14/2014  . Global aphasia 03/14/2014  . 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 Dysphagia due to recent stroke 03/14/2014  . Aphasia due to stroke 03/13/2014  . Right hemiparesis ([place]) 03/13/2014  . Embolic stroke ([place])   . Cerebral infarction due to embolism of left middle cerebral  artery ([place])   . Cerebral infarction due to thrombosis of right middle cerebral artery ([place])   . Stroke ([place]) 03/08/2014  . Pulmonary embolism ([place]) 03/08/2014  . Back pain 08/29/2013  . Primary localized osteoarthrosis, lower leg 03/06/2013  . IBS (irritable bowel syndrome)   . Multinodular goiter 01/13/2011  . HYPERTHYROIDISM 11/11/2009  . Insomnia 07/03/2009  . POSTTRAUMATIC STRESS DISORDER 08/22/2008  . OBESITY 04/21/2008  . Osteoarthritis 04/21/2008  . MIGRAINE HEADACHE 04/20/2008  . Essential hypertension 04/20/2008   TREATMENT:  Pt has been on hold awaiting full length arm rests. Dtr has finally obtained these and came today to learn how to put on half lap tray on chair as well as how to position pt in chair and how to position RUE on lap tray.  Also trained dtr on how to get tray on and off wheelchair as well as put RUE on and off lap tray without pulling on RUE or raising RUE too high. Dtr able to verbalize understanding and return demonstrate. Discussed importance of elevating RUE once on tray and keeping hand on soft build up (either towels or small pillow) to avoid pressure areas on R hand from tray. Demonstrated techniques to dtr and pt. Pt appeared very comfortable with half lap tray. Also demonstrated and emphasized making sure hips are all the way back in the wheelchair prior to putting on lap tray.  Dtr also reported that pt  has not been wearing splint much because "one night she slept on it and the hand swelled."  Discussed that we had recommended that pt wear splint during the day with a mid day break if pt unable to sleep safely with hand splint.  Dtr verbalized understanding. Pt's hand was positioned in radial deviation, wrist flexion and tight fist when she arrived today and therefore discussed risk of contracture if pt does not wear splint at least some of the time. Dtr able to verbalize understanding.  Dtr is practicing sliding board transfers with PT.  Pt has met all OT goals and is ready for discharge - dtr in agreement (pt unable to verbalize).   OCCUPATIONAL THERAPY DISCHARGE SUMMARY  Visits from Start of Care: 12  Current functional level related to goals / functional outcomes: See above status for goals   Remaining deficits: Spastic R hemiplegia, sensory loss, R inattention, global aphasia, severe cognitive deficits.   Education / Equipment: HEP, positioning equipment.  Plan: Patient agrees to discharge.  Patient goals were met. Patient is being discharged due to meeting the stated rehab goals.  ?????     Quay Burow , OTR/L  12/03/2014, 5:07 PM  [facility] [address] [place], [place], [address] Phone: [phone]   Fax:  [phone]  Name: Sandy Barrett MRN: [id number] Date of Birth: [birth year]

## 2014-12-03 NOTE — Patient Instructions (Signed)
Reviewed putting on and taking off of half lap tray with new full length arm rests, positioning of RUE on lap tray, importance of elevation. Dtr able to verbalize and return demonstrate. Emphasized importance of making sure hips are all the way back in wheelchair before placing arm on half lap tray.  Also discussed importance of splint - see tx note.

## 2014-12-04 ENCOUNTER — Telehealth: Payer: Self-pay

## 2014-12-04 ENCOUNTER — Ambulatory Visit: Payer: Medicare Other | Admitting: Speech Pathology

## 2014-12-04 ENCOUNTER — Ambulatory Visit: Payer: Medicare Other | Admitting: Physical Therapy

## 2014-12-04 ENCOUNTER — Encounter: Payer: Self-pay | Admitting: Physical Therapy

## 2014-12-04 DIAGNOSIS — R252 Cramp and spasm: Secondary | ICD-10-CM

## 2014-12-04 DIAGNOSIS — R4189 Other symptoms and signs involving cognitive functions and awareness: Secondary | ICD-10-CM

## 2014-12-04 DIAGNOSIS — Z7409 Other reduced mobility: Secondary | ICD-10-CM

## 2014-12-04 DIAGNOSIS — M79605 Pain in left leg: Secondary | ICD-10-CM | POA: Diagnosis not present

## 2014-12-04 DIAGNOSIS — I69351 Hemiplegia and hemiparesis following cerebral infarction affecting right dominant side: Secondary | ICD-10-CM | POA: Diagnosis not present

## 2014-12-04 DIAGNOSIS — I69951 Hemiplegia and hemiparesis following unspecified cerebrovascular disease affecting right dominant side: Secondary | ICD-10-CM

## 2014-12-04 DIAGNOSIS — IMO0002 Reserved for concepts with insufficient information to code with codable children: Secondary | ICD-10-CM

## 2014-12-04 DIAGNOSIS — M79602 Pain in left arm: Secondary | ICD-10-CM | POA: Diagnosis not present

## 2014-12-04 DIAGNOSIS — R201 Hypoesthesia of skin: Secondary | ICD-10-CM

## 2014-12-04 DIAGNOSIS — M2560 Stiffness of unspecified joint, not elsewhere classified: Secondary | ICD-10-CM

## 2014-12-04 DIAGNOSIS — M25562 Pain in left knee: Secondary | ICD-10-CM

## 2014-12-04 DIAGNOSIS — M623 Immobility syndrome (paraplegic): Secondary | ICD-10-CM | POA: Diagnosis not present

## 2014-12-04 DIAGNOSIS — R4701 Aphasia: Secondary | ICD-10-CM

## 2014-12-04 DIAGNOSIS — R600 Localized edema: Secondary | ICD-10-CM

## 2014-12-04 DIAGNOSIS — R414 Neurologic neglect syndrome: Secondary | ICD-10-CM

## 2014-12-04 DIAGNOSIS — R2681 Unsteadiness on feet: Secondary | ICD-10-CM | POA: Diagnosis not present

## 2014-12-04 NOTE — Telephone Encounter (Signed)
Sandy Barrett with [facility] called -   OP PT was original therapy order - then they (pt dtrs) decided they would like to do the in home PT; pt dtr then decided that they wanted to the OP PT.  Verbal okay given for PT to be done OP setting.

## 2014-12-04 NOTE — Telephone Encounter (Signed)
Pt's daughter is concerned about increased pain in affected arm....she was wondering if they could come in earlier...or if the pain would affect her ability to get further botox treatment. I informed that Botox is only effective if it is delivered in 3 month intervals as it is clinically indicated. She said she understood, will hold the appt, she states pt has responded really well to last treatment

## 2014-12-04 NOTE — Patient Instructions (Signed)
Continue language enhancing activities at home

## 2014-12-04 NOTE — Therapy (Signed)
[facility] [address] [place], [place], [address] Phone: [phone]   Fax:  [phone]  Speech Language Pathology Treatment  Patient Details  Name: Sandy Barrett MRN: [id number] Date of Birth: 1932-10-03  Dr. Alysia Penna   Encounter Date: 12/04/2014      End of Session - 12/04/14 1452    Visit Number 11   Number of Visits 25   Date for SLP Re-Evaluation 12/07/14   SLP Start Time 7209   SLP Stop Time  1445   SLP Time Calculation (min) 42 min      Past Medical History  Diagnosis Date  . HYPERTENSION   . HYPERTHYROIDISM   . OBESITY   . ARTHRITIS   . Posttraumatic stress disorder   . INSOMNIA   . MIGRAINE HEADACHE   . Hyperthyroidism     s/p I-131 ablation 03/2011 of multinod goiter  . Arthritis   . Stroke [facility]) 03/2014    dysarthria  . Diverticulosis   . Pulmonary embolism ([place]) 03/2014    with DVT    Past Surgical History  Procedure Laterality Date  . Abdominal hysterectomy  1976  . Total hip arthroplasty  1998     right  . Tonsillectomy    . Breast surgery      biopsy  . Radiology with anesthesia Left 03/08/2014    Procedure: RADIOLOGY WITH ANESTHESIA;  Surgeon: Rob Hickman, MD;  Location: [facility];  Service: Radiology;  Laterality: Left;    There were no vitals filed for this visit.  Visit Diagnosis: Expressive aphasia  Receptive aphasia      Subjective Assessment - 12/04/14 1405    Subjective "She has been doing OK with the singing and counting, not so great with naming or identifying objects"               ADULT SLP TREATMENT - 12/04/14 1406    General Information   Behavior/Cognition Alert;Pleasant mood;Cooperative   Treatment Provided   Treatment provided Cognitive-Linquistic   Pain Assessment   Pain Assessment Faces   Faces Pain Scale Hurts whole lot   Pain Location right arm   Pain Descriptors / Indicators Aching   Pain Intervention(s) Monitored  during session   Cognitive-Linquistic Treatment   Treatment focused on Apraxia;Aphasia   Skilled Treatment Auditory and reading comprehension f:2 with consistent max cues including gestures, semantic cues and phonemic cues., less than 50% accuracy. Automatic speech counting, months, carols with 50% accuracy with consistent max  visual cues and in unison with ST. Cloze exercise with Lords prayer 50% accuracy with  consistent max A. Trained daughter to use multimodal communication, including written word, gesture, sentence and imitation to facilitate language at home.    Assessment / Recommendations / Plan   Plan Discharge SLP treatment due to (comment)   Progression Toward Goals   Progression toward goals Not progressing toward goals (comment)          SLP Education - 12/04/14 1446    Education provided Yes   Education Details theraputic language activities to do at home   Person(s) Educated Patient;Spouse   Methods Explanation;Demonstration;Verbal cues   Comprehension Verbalized understanding;Returned demonstration     SPEECH THERAPY DISCHARGE SUMMARY  Visits from Start of Care: 11  Current functional level related to goals / functional outcomes: See goals below   Remaining deficits: Profound fluent  (Wernike's) aphasia with perseverative neologisms   Education / Equipment: Multimodal communication strategies, theraputic language activities to be done  at home Plan: Patient agrees to discharge.  Patient goals were not met. Patient is being discharged due to lack of progress.  ?????          SLP Short Term Goals - 2014-12-10 1451    SLP SHORT TERM GOAL #1   Title pt will receptively ID pictures or objects from f:3, given name of picture/object 30%   Time 1   Period Weeks   Status Not Met   SLP SHORT TERM GOAL #2   Title pt will definitively ID yes/no responses via communication board or head movements 40% of the time   Time 1   Period Weeks   Status Not Met   SLP SHORT  TERM GOAL #3   Title pt will imitate consonant-vowel syllables/words with 40% success   Time 1   Period Weeks   Status Not Met   SLP SHORT TERM GOAL #4   Title pt will perform automatic speech tasks independently with average 30% success including counting 1-20, DOW,    Time 1   Period Weeks   Status Not Met          SLP Long Term Goals - 12-10-2014 1451    SLP LONG TERM GOAL #1   Title pt will receptively ID pictures/objects from f:3 40% success   Time 2   Period Weeks   Status Not Met   SLP LONG TERM GOAL #2   Title pt will perform activities at home with family as reported by family members at least 4 days per week   Time 2   Period Weeks   Status Not Met   SLP LONG TERM GOAL #3   Title pt will ID yes/no responses with head movement or communication board with 50% of the time   Time 3   Period Weeks   Status Not Met   SLP LONG TERM GOAL #4   Title pt will answer yes/no questions re: functional information with 90% success   Time 3   Period Weeks   Status Not Met   SLP LONG TERM GOAL #5   Title pt will perform automatic speech tasks with average 40% success including DOW and counting 1-20   Time 3   Period Weeks   Status Not Met   SLP LONG TERM GOAL #6   Title pt will use multimodal communication to expressively communicate in 70% of opportunities   Time 3   Period Weeks   Status Not Met          Plan - 12-10-2014 1450    Treatment/Interventions Patient/family education;Multimodal communcation approach;Language facilitation;SLP instruction and feedback;Compensatory strategies;Cueing hierarchy;Functional tasks   Potential to Achieve Goals Fair   Potential Considerations Severity of impairments;Cooperation/participation level   Family Member Consulted daughter          G-Codes - Dec 10, 2014 1453    Functional Assessment Tool Used NOMS   Functional Limitations Spoken language expressive   Spoken Language Expression Goal Status [phone]) At least 25 percent but  less than 100 percent impaired, limited or restricted   Spoken Language Expression Discharge Status [phone]) At least 80 percent but less than 100 percent impaired, limited or restricted      Problem List Patient Active Problem List   Diagnosis Date Noted  . Decubitus ulcer of sacral region, stage 2 11/26/2014  . Primary osteoarthritis of left knee 10/29/2014  . Primary osteoarthritis involving multiple joints 07/26/2014  . Pernicious anemia 07/26/2014  . Hearing loss 07/26/2014  . Encounter  for therapeutic drug monitoring 07/17/2014  . Spastic hemiparesis affecting dominant side ([place]) 05/31/2014  . DVT of lower extremity, bilateral ([place]) 03/14/2014  . Global aphasia 03/14/2014  . 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 Dysphagia due to recent stroke 03/14/2014  . Aphasia due to stroke 03/13/2014  . Right hemiparesis ([place]) 03/13/2014  . Embolic stroke ([place])   . Cerebral infarction due to embolism of left middle cerebral artery ([place])   . Cerebral infarction due to thrombosis of right middle cerebral artery ([place])   . Stroke ([place]) 03/08/2014  . Pulmonary embolism ([place]) 03/08/2014  . Back pain 08/29/2013  . Primary localized osteoarthrosis, lower leg 03/06/2013  . IBS (irritable bowel syndrome)   . Multinodular goiter 01/13/2011  . HYPERTHYROIDISM 11/11/2009  . Insomnia 07/03/2009  . POSTTRAUMATIC STRESS DISORDER 08/22/2008  . OBESITY 04/21/2008  . Osteoarthritis 04/21/2008  . MIGRAINE HEADACHE 04/20/2008  . Essential hypertension 04/20/2008    Less [place], Annye Rusk MS, [place] 12/04/2014, 2:53 PM  [facility] [address] [place], [place], [address] Phone: [phone]   Fax:  [phone]   Name: Sandy Barrett MRN: [id number] Date of Birth: 1932/10/01

## 2014-12-04 NOTE — Therapy (Signed)
[facility] [address] [place], [place], [address] Phone: [phone]   Fax:  [phone]  Physical Therapy Treatment  Patient Details  Name: Sandy Barrett MRN: [id number] Date of Birth: 06-15-1932 Referring Provider: Alysia Penna, MD  Encounter Date: 12/04/2014      PT End of Session - 12/04/14 1538    Visit Number 16   Number of Visits 27  per PT recert   Date for PT Re-Evaluation 47/09/62  per PT recert   Authorization Type Medicare G-Code every 10th visit   PT Start Time 8366   PT Stop Time 1532   PT Time Calculation (min) 47 min   Equipment Utilized During Treatment Gait belt   Activity Tolerance Patient tolerated treatment well   Behavior During Therapy [facility] for tasks assessed/performed      Past Medical History  Diagnosis Date  . HYPERTENSION   . HYPERTHYROIDISM   . OBESITY   . ARTHRITIS   . Posttraumatic stress disorder   . INSOMNIA   . MIGRAINE HEADACHE   . Hyperthyroidism     s/p I-131 ablation 03/2011 of multinod goiter  . Arthritis   . Stroke [facility]) 03/2014    dysarthria  . Diverticulosis   . Pulmonary embolism ([place]) 03/2014    with DVT    Past Surgical History  Procedure Laterality Date  . Abdominal hysterectomy  1976  . Total hip arthroplasty  1998     right  . Tonsillectomy    . Breast surgery      biopsy  . Radiology with anesthesia Left 03/08/2014    Procedure: RADIOLOGY WITH ANESTHESIA;  Surgeon: Rob Hickman, MD;  Location: [facility];  Service: Radiology;  Laterality: Left;    There were no vitals filed for this visit.  Visit Diagnosis:  Expressive aphasia     Receptive aphasia   .  Hemiplegia affecting right side in right-dominant patient as late effect of cerebrovascular disease ([place])    Impaired cognition   .  Right-sided visual neglect   .  Localized edema   .  Impaired sensation    Decreased transfer ability   .  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  Knee pain, acute, left         Subjective Assessment - 12/04/14 1547    Subjective Pt. and daughter report no pain or other complaints.  Daughter reports sacral wounds aren't healing and MD has prescribed home health therapy to promote further care for wound healing.     Currently in Pain? No/denies   Pain Score 0-No pain   Multiple Pain Sites No      Therapeutic Activities: Daughter, [name], observing with PTA cueing her to how & why assisting Ms. Hamid during activities to facilitate more carryover to home situation.  - Scooting forward to edge of w/c with lateral trunk lean to unweight each side of pelvis, tactile cues under LE of pelvic side shifting forward: MinA to scoot left side forward and ModA (total assist to support LE) to scoot right side forward. - Sliding board transfer w/c to mat (1" higher) to pt's right with moderate assist with tactile & verbal cues on forward weight shift / flexed trunk. - Sitting balance: lateral trunk lean to right with manual /  tactile cues; pt, demo'd pain with wt. bearing through the R shoulder.   - sitting weight shift with reaching to plastic cone on table; L UE to right side reaching x 10 reps; pt. demo'd apprehension with forward weight shift. - sitting weight shift with reaching bowling pins from elevated table; pt. able to put bowling pins in crate with daughter holding it on pt's L side with cross over; x 10 reps.  - Sliding board transfer mat to w/c (1" lower) to patient's left side; PTA offered mod/max assist.          [facility] Adult PT Treatment/Exercise - 12/04/14 1551    Bed Mobility   Sitting - Scoot to Edge of Bed 3: Mod assist   Sitting - Scoot to Edge of Bed Details (indicate cue type and reason) Max cueing for weight shifting, RLE WB.     Transfers   Transfers  --  sliding board transfer    Lateral/Scoot Transfers 3: Mod assist;With slide board;With armrests removed   Lateral/Scoot Transfer Details (indicate cue type and reason) Pt. required max cues for forward wt. shifting and L UE push off of arm rest; pt. required max assist from therapist for transfer; additional cues required for hand placement.              PT Short Term Goals - 11/14/14 1843    PT SHORT TERM GOAL #1   Title Family demonstrates updated, initial HEP correctly with minimal cues. (Target Date: 10/18/2014)   Baseline MET 10/23/2014 with HEP instructed to date.   Time 4   Period Weeks   Status Achieved   PT SHORT TERM GOAL #2   Title Sliding Board Transfers w/c to level surface with maximal assist.  (Target Date: 10/18/2014)   Baseline MET 10/23/2014   Time 4   Period Weeks   Status Achieved   PT SHORT TERM GOAL #3   Title Patient performs sitting balance at edge of mat reaching 2" anteriorly & left and across midline with left UE with contact assist.  (Target Date: 10/18/2014)   Baseline MET 10/23/2014   Time 4   Period Weeks   Status Achieved   PT SHORT TERM GOAL #4   Title Daughter demonstrates sliding board transfer with her mother with PT instruction & minA. (Target Date: 12/14/2014)   Time 1   Period Months   Status New   PT SHORT TERM GOAL #5   Title Sit to stand pulling on parallel bar or sink with maximal assist. (Target Date: 12/14/2014)   Time 1   Period Months   Status New   PT SHORT TERM GOAL #6   Title Patient able to tolerate standing at parallel bar for 60 seconds with maximal assist (Target Date: 12/14/2014)   Time 1   Period Months   Status New           PT Long Term Goals - 11/14/14 1921    PT LONG TERM GOAL #1   Title Family demonstrates updated HEP and verbalizes benefits for decreasing LE pain associated with stiffness.  (NEW Target Date: 01/11/2015)   Baseline NOT MET 11/13/2014 Daughter understands basic HEP but needs instruction in  updated HEP   Time 8   Period Weeks   Status On-going   PT LONG TERM GOAL #2   Title Family performs transfer with patient with sliding board safely. (NEW Target Date: 01/11/2015)   Baseline NOT MET 11/13/2014 Patient's daughter needs further instruction for sliding board transfer  w/c to/from mat.    Time 8   Period Weeks   Status Revised   PT LONG TERM GOAL #3   Title Patient able to reach 5" anteriorly, left & across midline with left UE sitting at edge of mat or w/c with feet on floor with supervision.  (Target Date: 11/16/2014)   Baseline MET 11/13/2014   Time 8   Period Weeks   Status Achieved   PT LONG TERM GOAL #4   Title Patient able to roll left & right with rail and boost buttocks in bed to aide with self-care with cues only.   (NEW Target Date: 01/11/2015)   Baseline NOT MET 11/9, as pt requires (S) to roll R, min A to roll L, and up to Mod A for boosting/bridging.   Time 8   Period Weeks   Status Not Met   PT LONG TERM GOAL #5   Title Patient sit to stand pulling on parallel bar with moderate assist and maintain standing for 2 minutes with MinA.  (NEW Target Date: 01/11/2015)   Time 8   Period Weeks   Status New            Plan - 12/04/14 1539    Clinical Impression Statement No pain or other complaints reported by pt. or daughter.  Daughter reports sacral wounds aren't healing and MD recommended home health therapy for sacral wounds.  Home health scheduled for pt.    Pt will benefit from skilled therapeutic intervention in order to improve on the following deficits Abnormal gait;Decreased activity tolerance;Decreased balance;Decreased mobility;Decreased range of motion;Decreased strength;Pain   Rehab Potential Good   PT Frequency 2x / week   PT Duration 8 weeks   PT Treatment/Interventions ADLs/Self Care Home Management;DME Instruction;Functional mobility training;Therapeutic activities;Therapeutic exercise;Balance training;Neuromuscular re-education;Patient/family  education;Wheelchair mobility training   PT Next Visit Plan Address LTG's for discharge next visit.  Work on pt initiating full anterior weight shift to promote independence with scooting, transfers. continue to work on fuctional standing for assist/easement of ADL's   Consulted and Agree with Plan of Care Patient;Family member/caregiver   Family Member Consulted daughter, [name]        Problem List Patient Active Problem List   Diagnosis Date Noted  . Decubitus ulcer of sacral region, stage 2 11/26/2014  . Primary osteoarthritis of left knee 10/29/2014  . Primary osteoarthritis involving multiple joints 07/26/2014  . Pernicious anemia 07/26/2014  . Hearing loss 07/26/2014  . Encounter for therapeutic drug monitoring 07/17/2014  . Spastic hemiparesis affecting dominant side ([place]) 05/31/2014  . DVT of lower extremity, bilateral ([place]) 03/14/2014  . Global aphasia 03/14/2014  . 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 Dysphagia due to recent stroke 03/14/2014  . Aphasia due to stroke 03/13/2014  . Right hemiparesis ([place]) 03/13/2014  . Embolic stroke ([place])   . Cerebral infarction due to embolism of left middle cerebral artery ([place])   . Cerebral infarction due to thrombosis of right middle cerebral artery ([place])   . Stroke ([place]) 03/08/2014  . Pulmonary embolism ([place]) 03/08/2014  . Back pain 08/29/2013  . Primary localized osteoarthrosis, lower leg 03/06/2013  . IBS (irritable bowel syndrome)   . Multinodular goiter 01/13/2011  . HYPERTHYROIDISM 11/11/2009  . Insomnia 07/03/2009  . POSTTRAUMATIC STRESS DISORDER 08/22/2008  . OBESITY 04/21/2008  . Osteoarthritis 04/21/2008  . MIGRAINE HEADACHE 04/20/2008  . Essential hypertension 04/20/2008    Bess Harvest 12/04/2014, 4:10 PM  Bess Harvest, [place]  Name: Sandy Barrett MRN: [id number]  Date of Birth: March 22, 1932  This note has been reviewed and edited by supervising CI. Pt will be discharged at next visit due to resumption of home  health services. Primary PT notified and in agreement.   Willow Ora, PTA, [facility] [address] [place], [place] [address] [phone] 12/06/2014, 8:31 AM

## 2014-12-04 NOTE — Patient Instructions (Signed)
Daughter instructed on technique for sliding board transfer at home and verbalized understanding.

## 2014-12-05 ENCOUNTER — Ambulatory Visit: Payer: Medicare Other

## 2014-12-06 ENCOUNTER — Encounter: Payer: Medicare Other | Admitting: Speech Pathology

## 2014-12-07 ENCOUNTER — Telehealth: Payer: Self-pay | Admitting: *Deleted

## 2014-12-07 ENCOUNTER — Encounter: Payer: Self-pay | Admitting: Physical Therapy

## 2014-12-07 ENCOUNTER — Ambulatory Visit: Payer: Medicare Other | Attending: Physical Medicine & Rehabilitation | Admitting: Physical Therapy

## 2014-12-07 DIAGNOSIS — R2689 Other abnormalities of gait and mobility: Secondary | ICD-10-CM | POA: Insufficient documentation

## 2014-12-07 DIAGNOSIS — R600 Localized edema: Secondary | ICD-10-CM | POA: Diagnosis not present

## 2014-12-07 DIAGNOSIS — R414 Neurologic neglect syndrome: Secondary | ICD-10-CM

## 2014-12-07 DIAGNOSIS — M623 Immobility syndrome (paraplegic): Secondary | ICD-10-CM | POA: Insufficient documentation

## 2014-12-07 DIAGNOSIS — R2681 Unsteadiness on feet: Secondary | ICD-10-CM

## 2014-12-07 DIAGNOSIS — R258 Other abnormal involuntary movements: Secondary | ICD-10-CM | POA: Insufficient documentation

## 2014-12-07 DIAGNOSIS — R201 Hypoesthesia of skin: Secondary | ICD-10-CM | POA: Diagnosis not present

## 2014-12-07 DIAGNOSIS — R482 Apraxia: Secondary | ICD-10-CM | POA: Diagnosis not present

## 2014-12-07 DIAGNOSIS — R4189 Other symptoms and signs involving cognitive functions and awareness: Secondary | ICD-10-CM | POA: Diagnosis not present

## 2014-12-07 DIAGNOSIS — Z7409 Other reduced mobility: Secondary | ICD-10-CM | POA: Diagnosis not present

## 2014-12-07 DIAGNOSIS — R252 Cramp and spasm: Secondary | ICD-10-CM

## 2014-12-07 DIAGNOSIS — M79602 Pain in left arm: Secondary | ICD-10-CM | POA: Diagnosis not present

## 2014-12-07 DIAGNOSIS — M79605 Pain in left leg: Secondary | ICD-10-CM

## 2014-12-07 DIAGNOSIS — I69351 Hemiplegia and hemiparesis following cerebral infarction affecting right dominant side: Secondary | ICD-10-CM

## 2014-12-07 DIAGNOSIS — I69951 Hemiplegia and hemiparesis following unspecified cerebrovascular disease affecting right dominant side: Secondary | ICD-10-CM | POA: Diagnosis not present

## 2014-12-07 DIAGNOSIS — IMO0002 Reserved for concepts with insufficient information to code with codable children: Secondary | ICD-10-CM

## 2014-12-07 DIAGNOSIS — M25562 Pain in left knee: Secondary | ICD-10-CM | POA: Diagnosis not present

## 2014-12-07 DIAGNOSIS — M2560 Stiffness of unspecified joint, not elsewhere classified: Secondary | ICD-10-CM

## 2014-12-07 MED ORDER — GUAIFENESIN-DM 100-10 MG/5ML PO SYRP
5.0000 mL | ORAL_SOLUTION | ORAL | Status: DC | PRN
Start: 2014-12-07 — End: 2015-07-20

## 2014-12-07 NOTE — Telephone Encounter (Signed)
Left msg on triage stating the prescription that was rx for mom pharmacy states it's just a over the counter Robitussin. She is wanting to clarify will this be ok for her couple years back she had a bad experience with taking ovc cough meds, and was told not to take. Pls advise,,,/lmb

## 2014-12-07 NOTE — Therapy (Signed)
[facility] [address] [place], [place], [address] Phone: [phone]   Fax:  [phone]  Physical Therapy Treatment  Patient Details  Name: Sandy Barrett MRN: [id number] Date of Birth: January 26, 1932 Referring Provider: Alysia Penna, MD  Encounter Date: 12/07/2014      PT End of Session - 12/07/14 1655    Visit Number 17   Number of Visits 27  per PT recert   Date for PT Re-Evaluation 12/75/17  per PT recert   Authorization Type Medicare G-Code every 10th visit   PT Start Time 1318   PT Stop Time 1350   PT Time Calculation (min) 32 min   Equipment Utilized During Treatment Gait belt   Activity Tolerance Patient tolerated treatment well   Behavior During Therapy [facility] for tasks assessed/performed      Past Medical History  Diagnosis Date  . HYPERTENSION   . HYPERTHYROIDISM   . OBESITY   . ARTHRITIS   . Posttraumatic stress disorder   . INSOMNIA   . MIGRAINE HEADACHE   . Hyperthyroidism     s/p I-131 ablation 03/2011 of multinod goiter  . Arthritis   . Stroke [facility]) 03/2014    dysarthria  . Diverticulosis   . Pulmonary embolism ([place]) 03/2014    with DVT    Past Surgical History  Procedure Laterality Date  . Abdominal hysterectomy  1976  . Total hip arthroplasty  1998     right  . Tonsillectomy    . Breast surgery      biopsy  . Radiology with anesthesia Left 03/08/2014    Procedure: RADIOLOGY WITH ANESTHESIA;  Surgeon: Rob Hickman, MD;  Location: [place];  Service: Radiology;  Laterality: Left;    There were no vitals filed for this visit.  Visit Diagnosis:  Hemiplegia affecting right side in right-dominant patient as late effect of cerebrovascular disease ([place])  Impaired cognition  Right-sided visual neglect  Localized edema  Decreased transfer ability  Impaired sensation  Spasticity  Stiffness due to immobility  Hemiparesis affecting right side as late effect of  cerebrovascular accident [facility])  Lower limb pain, diffuse, left  Knee pain, acute, left  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  Unsteadiness      Subjective Assessment - 12/07/14 1649    Subjective Daughter reports renewal for home health required however she still intends to discharge today.  Daughter reports renewal for home health is scheduled in the next few days.     Patient Stated Goals Her daughter reports would like to improve her strength and ability to move around the house.    Currently in Pain? No/denies   Pain Score 0-No pain   Multiple Pain Sites No            [facility] Adult PT Treatment/Exercise - 12/07/14 1651    Transfers   Transfers Sit to Stand;Lateral/Scoot Transfers   Sit to Stand 2: Max assist   Sit to Stand Details Tactile cues for initiation;Tactile cues for sequencing;Tactile cues for weight shifting;Tactile cues for placement;Tactile cues for weight beaing;Verbal cues for sequencing;Verbal cues for technique;Verbal cues for precautions/safety   Sit to Stand Details (indicate cue type and reason) Pt. attemped sit<>standing to parallel bars; activity suspended due to pt.not able to achieve partial standing with max to total assist of 2. Pt rubbing on left leg and moaning after 2 attempts.      Lateral/Scoot Transfers 3: Mod assist;With slide board;With armrests removed   Lateral/Scoot Transfer Details (indicate cue  type and reason) pt. required max verbal cueing with tactile cueing for forward wt. shift and UE push off with sliding board.      Seated edge of mat: focused on increasing forward and lateral weight shifting out of pt's base of support. Pt needed multimodal cues and facilitation to achieve this while moving cones and retrieving cones from various points.          PT Short Term Goals - 11/14/14 1843    PT SHORT TERM GOAL #1   Title Family demonstrates updated, initial HEP correctly with minimal cues. (Target Date: 10/18/2014)   Baseline MET 10/23/2014 with  HEP instructed to date.   Time 4   Period Weeks   Status Achieved   PT SHORT TERM GOAL #2   Title Sliding Board Transfers w/c to level surface with maximal assist.  (Target Date: 10/18/2014)   Baseline MET 10/23/2014   Time 4   Period Weeks   Status Achieved   PT SHORT TERM GOAL #3   Title Patient performs sitting balance at edge of mat reaching 2" anteriorly & left and across midline with left UE with contact assist.  (Target Date: 10/18/2014)   Baseline MET 10/23/2014   Time 4   Period Weeks   Status Achieved   PT SHORT TERM GOAL #4   Title Daughter demonstrates sliding board transfer with her mother with PT instruction & minA. (Target Date: 12/14/2014)   Time 1   Period Months   Status New   PT SHORT TERM GOAL #5   Title Sit to stand pulling on parallel bar or sink with maximal assist. (Target Date: 12/14/2014)   Time 1   Period Months   Status New   PT SHORT TERM GOAL #6   Title Patient able to tolerate standing at parallel bar for 60 seconds with maximal assist (Target Date: 12/14/2014)   Time 1   Period Months   Status New           PT Long Term Goals - 12/07/14 1658    PT LONG TERM GOAL #1   Title Family demonstrates updated HEP and verbalizes benefits for decreasing LE pain associated with stiffness.  (NEW Target Date: 01/11/2015)   Baseline 12/07/2014: Daughter reports understanding basic HEP but needs instruction in updated HEP. HHPT to resume and continue with home exercise.   Time 8   Period Weeks   Status Not Met   PT LONG TERM GOAL #2   Title Family performs transfer with patient with sliding board safely. (NEW Target Date: 01/11/2015)   Baseline 12/07/2014: Patient's daughter reports she was able to perform sliding board transfer from bed to w/c with min assist from [facility]  at home safely.     Time 8   Period Weeks   Status Achieved   PT LONG TERM GOAL #3   Title Patient able to reach 5" anteriorly, left & across midline with left UE sitting at edge of mat or  w/c with feet on floor with supervision.  (Target Date: 11/16/2014)   Baseline 12/07/2014: pt. unable to safely reach 5" anteriorly and left across midline with L upper extremity while sitting without physical assistance.     Time 8   Period Weeks   Status Not Met   PT LONG TERM GOAL #4   Title Patient able to roll left & right with rail and boost buttocks in bed to aide with self-care with cues only.   (NEW Target Date: 01/11/2015)  Baseline 12/09/14: Pt continues to need up to total assist at times.   Time 8   Period Weeks   Status Not Met   PT LONG TERM GOAL #5   Title Patient sit to stand pulling on parallel bar with moderate assist and maintain standing for 2 minutes with MinA.  (NEW Target Date: 01/11/2015)   Baseline 12-09-14: Pt. unable to achieve standing today in session with 2 person assist.   Time 8   Period Weeks   Status Not Met       12/09/2014 1656  Plan  Clinical Impression Statement Goals checked, most not met due with the early discharge due to resumption of [place] services. Daughter reports plans to return to outpt once pt's wound has healed enough to no longer need HH if Md agrees. Plan for discharge today.  Pt will benefit from skilled therapeutic intervention in order to improve on the following deficits Abnormal gait;Decreased activity tolerance;Decreased balance;Decreased mobility;Decreased range of motion;Decreased strength;Pain  Rehab Potential Good  PT Frequency 2x / week  PT Duration 8 weeks  PT Treatment/Interventions ADLs/Self Care Home Management;DME Instruction;Functional mobility training;Therapeutic activities;Therapeutic exercise;Balance training;Neuromuscular re-education;Patient/family education;Wheelchair mobility training  PT Next Visit Plan Discharge today  Consulted and Agree with Plan of Care Patient;Family member/caregiver  Family Member Consulted daughter, [name]    Problem List Patient Active Problem List   Diagnosis Date Noted  . Decubitus  ulcer of sacral region, stage 2 11/26/2014  . Primary osteoarthritis of left knee 10/29/2014  . Primary osteoarthritis involving multiple joints 07/26/2014  . Pernicious anemia 07/26/2014  . Hearing loss 07/26/2014  . Encounter for therapeutic drug monitoring 07/17/2014  . Spastic hemiparesis affecting dominant side ([place]) 05/31/2014  . DVT of lower extremity, bilateral ([place]) 03/14/2014  . Global aphasia 03/14/2014  . 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 Dysphagia due to recent stroke 03/14/2014  . Aphasia due to stroke 03/13/2014  . Right hemiparesis ([place]) 03/13/2014  . Embolic stroke ([place])   . Cerebral infarction due to embolism of left middle cerebral artery ([place])   . Cerebral infarction due to thrombosis of right middle cerebral artery ([place])   . Stroke ([place]) 03/08/2014  . Pulmonary embolism ([place]) 03/08/2014  . Back pain 08/29/2013  . Primary localized osteoarthrosis, lower leg 03/08/2013  . IBS (irritable bowel syndrome)   . Multinodular goiter 01/13/2011  . HYPERTHYROIDISM 11/11/2009  . Insomnia 07/03/2009  . POSTTRAUMATIC STRESS DISORDER 08/22/2008  . OBESITY 04/21/2008  . Osteoarthritis 04/21/2008  . MIGRAINE HEADACHE 04/20/2008  . Essential hypertension 04/20/2008    Bess Harvest December 09, 2014, 5:06 PM  Bess Harvest, [place]  Name: Stefany Starace MRN: [id number] Date of Birth: [birth year]  This note has been reviewed and edited by supervising CI.  Willow Ora, PTA, [facility] [address] [place], [place] [address] [phone] 12/09/2014, 10:18 PM    December 09, 2014 08-Mar-2217  PT G-Codes  Functional Assessment Tool Used min to mod assist with slide board transfers at times w/c to level mat surfaces. Amount of assistance needed continues to vary depending on pt's pain, fatigue and understanding. Pt's daughter now reports performing slide board transfers with assist of [place] aide at home.l

## 2014-12-07 NOTE — Telephone Encounter (Signed)
What experience did she have?  All of the cough syrups prescription or over the counter have a similar component in it to suppress the cough.  There are not many cough suppressants we can try

## 2014-12-07 NOTE — Telephone Encounter (Signed)
Daughter left msg on triage stating mom is having trouble swallowing the gel capsule on the Benzonatate  For her cough. Requesting if capsule can be change to tablet or maybe a liquid form. The cough has return and she is needing something today.Pls advise....[name]

## 2014-12-07 NOTE — Telephone Encounter (Signed)
Notified daughter with md response. She is going to go ahead and take the Robitussin will do btw 4-6 hours as need. Will call back if doesn't help...Sandy Barrett

## 2014-12-07 NOTE — Telephone Encounter (Signed)
I sent a prescription to her pharmacy for the generic of robitussin DM - that should work.

## 2014-12-07 NOTE — Telephone Encounter (Signed)
It should not cause her heart to race, but it is possible.  The only alternative is a medication like mucinex that comes in a liquid form.

## 2014-12-07 NOTE — Telephone Encounter (Signed)
Called daughter she stated that it made her heart race. She just wanted to make sure is this ok...[name]

## 2014-12-07 NOTE — Telephone Encounter (Signed)
Notified pt daughter with md response.../lmb 

## 2014-12-10 ENCOUNTER — Ambulatory Visit: Payer: Medicare Other | Admitting: Neurology

## 2014-12-11 ENCOUNTER — Ambulatory Visit: Payer: Medicare Other | Admitting: Physical Therapy

## 2014-12-11 ENCOUNTER — Ambulatory Visit (INDEPENDENT_AMBULATORY_CARE_PROVIDER_SITE_OTHER): Payer: Medicare Other | Admitting: General Practice

## 2014-12-11 DIAGNOSIS — I639 Cerebral infarction, unspecified: Secondary | ICD-10-CM

## 2014-12-11 DIAGNOSIS — Z5181 Encounter for therapeutic drug level monitoring: Secondary | ICD-10-CM

## 2014-12-11 DIAGNOSIS — I63311 Cerebral infarction due to thrombosis of right middle cerebral artery: Secondary | ICD-10-CM | POA: Diagnosis not present

## 2014-12-11 DIAGNOSIS — I2699 Other pulmonary embolism without acute cor pulmonale: Secondary | ICD-10-CM

## 2014-12-11 DIAGNOSIS — I82403 Acute embolism and thrombosis of unspecified deep veins of lower extremity, bilateral: Secondary | ICD-10-CM | POA: Diagnosis not present

## 2014-12-11 LAB — POCT INR: INR: 1.6

## 2014-12-11 NOTE — Progress Notes (Signed)
I have reviewed and agree with the plan. 

## 2014-12-11 NOTE — Progress Notes (Signed)
Pre visit review using our clinic review tool, if applicable. No additional management support is needed unless otherwise documented below in the visit note. 

## 2014-12-14 ENCOUNTER — Ambulatory Visit: Payer: Medicare Other | Admitting: Physical Therapy

## 2014-12-17 ENCOUNTER — Encounter: Payer: Medicare Other | Attending: Physical Medicine & Rehabilitation

## 2014-12-17 ENCOUNTER — Encounter: Payer: Self-pay | Admitting: Physical Medicine & Rehabilitation

## 2014-12-17 ENCOUNTER — Ambulatory Visit (HOSPITAL_BASED_OUTPATIENT_CLINIC_OR_DEPARTMENT_OTHER): Payer: Medicare Other | Admitting: Physical Medicine & Rehabilitation

## 2014-12-17 VITALS — BP 161/85 | HR 90 | Resp 14

## 2014-12-17 DIAGNOSIS — M7541 Impingement syndrome of right shoulder: Secondary | ICD-10-CM | POA: Insufficient documentation

## 2014-12-17 DIAGNOSIS — I69320 Aphasia following cerebral infarction: Secondary | ICD-10-CM | POA: Diagnosis not present

## 2014-12-17 DIAGNOSIS — G8190 Hemiplegia, unspecified affecting unspecified side: Secondary | ICD-10-CM | POA: Insufficient documentation

## 2014-12-17 DIAGNOSIS — G8111 Spastic hemiplegia affecting right dominant side: Secondary | ICD-10-CM

## 2014-12-17 DIAGNOSIS — M6289 Other specified disorders of muscle: Secondary | ICD-10-CM | POA: Diagnosis not present

## 2014-12-17 DIAGNOSIS — G8110 Spastic hemiplegia affecting unspecified side: Secondary | ICD-10-CM

## 2014-12-17 NOTE — Progress Notes (Signed)
.  Botox Injection for spasticity using needle EMG guidance Right Upper ext Botox Injection for spasticity using needle EMG guidance  Dilution: 50 Units/ml Indication: Severe spasticity which interferes with ADL,mobility and/or  hygiene and is unresponsive to medication management and other conservative care Informed consent was obtained after describing risks and benefits of the procedure with the patient. This includes bleeding, bruising, infection, excessive weakness, or medication side effects. A REMS form is on file and signed. Needle: 25g 2" needle electrode Number of units per muscle  FCR50 FDS50 FDP50 FPL50   PT50 FCU25  right pectoralis, 125units  All injections were done after obtaining appropriate EMG activity and after negative drawback for blood. There was relatively less EMG activity at the right FCU as well as the right pectoralis. Would consider reducing doses to these areas next injection.The patient tolerated the procedure well. Post procedure instructions were given. A followup appointment was made.

## 2014-12-17 NOTE — Patient Instructions (Signed)

## 2014-12-18 ENCOUNTER — Encounter: Payer: Medicare Other | Admitting: Speech Pathology

## 2014-12-18 ENCOUNTER — Ambulatory Visit: Payer: Medicare Other | Admitting: Physical Therapy

## 2014-12-21 ENCOUNTER — Telehealth: Payer: Self-pay | Admitting: Internal Medicine

## 2014-12-21 ENCOUNTER — Telehealth: Payer: Self-pay | Admitting: *Deleted

## 2014-12-21 ENCOUNTER — Ambulatory Visit: Payer: Medicare Other | Admitting: Physical Therapy

## 2014-12-21 NOTE — Telephone Encounter (Signed)
Ok - the last note was for [place] I believe - ok to revise as needed

## 2014-12-21 NOTE — Telephone Encounter (Signed)
This is a chronic issue. She needs to elevate the arm and do retrograde massage from the fingers toward the wrist. If it worsens or becomes more painful she may need further evaluation by her primary care physician and possibly venous ultrasound study

## 2014-12-21 NOTE — Telephone Encounter (Signed)
error 

## 2014-12-21 NOTE — Telephone Encounter (Signed)
Left arm in bend of arm is puffy and swollen and sore.  Recently had botox but was not exactly in the same area.  What can they do for it?  No bruising, just the swelling and soreness.

## 2014-12-21 NOTE — Telephone Encounter (Signed)
Sandy Barrett is calling about [name] having discomfort in the bend of her right arm. Please call.

## 2014-12-21 NOTE — Telephone Encounter (Signed)
[name] called in and said that she needs another updated work note .  She said that she has to get one every few months.  She would like to pick it up.

## 2014-12-21 NOTE — Telephone Encounter (Signed)
Please advise 

## 2014-12-24 NOTE — Telephone Encounter (Signed)
I spoke with Sandy Barrett and gave her the information.  She will follow up with PCP if concerns linger.

## 2014-12-25 ENCOUNTER — Emergency Department (HOSPITAL_COMMUNITY)
Admission: EM | Admit: 2014-12-25 | Discharge: 2014-12-25 | Disposition: A | Discharge status: Home / Self Care | Payer: Medicare Other | Attending: Emergency Medicine | Admitting: Emergency Medicine

## 2014-12-25 ENCOUNTER — Ambulatory Visit: Payer: Medicare Other | Admitting: Physical Therapy

## 2014-12-25 ENCOUNTER — Encounter (HOSPITAL_COMMUNITY): Payer: Self-pay

## 2014-12-25 ENCOUNTER — Emergency Department (HOSPITAL_COMMUNITY): Payer: Medicare Other

## 2014-12-25 ENCOUNTER — Encounter: Payer: Self-pay | Admitting: Emergency Medicine

## 2014-12-25 DIAGNOSIS — Z86718 Personal history of other venous thrombosis and embolism: Secondary | ICD-10-CM | POA: Diagnosis not present

## 2014-12-25 DIAGNOSIS — N281 Cyst of kidney, acquired: Secondary | ICD-10-CM | POA: Diagnosis not present

## 2014-12-25 DIAGNOSIS — Z79899 Other long term (current) drug therapy: Secondary | ICD-10-CM | POA: Diagnosis not present

## 2014-12-25 DIAGNOSIS — N898 Other specified noninflammatory disorders of vagina: Secondary | ICD-10-CM

## 2014-12-25 DIAGNOSIS — Z791 Long term (current) use of non-steroidal anti-inflammatories (NSAID): Secondary | ICD-10-CM | POA: Diagnosis not present

## 2014-12-25 DIAGNOSIS — I10 Essential (primary) hypertension: Secondary | ICD-10-CM | POA: Insufficient documentation

## 2014-12-25 DIAGNOSIS — K8020 Calculus of gallbladder without cholecystitis without obstruction: Secondary | ICD-10-CM | POA: Diagnosis not present

## 2014-12-25 DIAGNOSIS — J9811 Atelectasis: Secondary | ICD-10-CM | POA: Diagnosis not present

## 2014-12-25 DIAGNOSIS — Z8659 Personal history of other mental and behavioral disorders: Secondary | ICD-10-CM | POA: Insufficient documentation

## 2014-12-25 DIAGNOSIS — N390 Urinary tract infection, site not specified: Secondary | ICD-10-CM | POA: Diagnosis not present

## 2014-12-25 DIAGNOSIS — M1990 Unspecified osteoarthritis, unspecified site: Secondary | ICD-10-CM | POA: Diagnosis not present

## 2014-12-25 DIAGNOSIS — R300 Dysuria: Secondary | ICD-10-CM | POA: Diagnosis present

## 2014-12-25 DIAGNOSIS — Z7901 Long term (current) use of anticoagulants: Secondary | ICD-10-CM | POA: Diagnosis not present

## 2014-12-25 DIAGNOSIS — G43909 Migraine, unspecified, not intractable, without status migrainosus: Secondary | ICD-10-CM | POA: Insufficient documentation

## 2014-12-25 DIAGNOSIS — N6459 Other signs and symptoms in breast: Secondary | ICD-10-CM | POA: Diagnosis not present

## 2014-12-25 DIAGNOSIS — Z8673 Personal history of transient ischemic attack (TIA), and cerebral infarction without residual deficits: Secondary | ICD-10-CM | POA: Diagnosis not present

## 2014-12-25 DIAGNOSIS — M25552 Pain in left hip: Secondary | ICD-10-CM | POA: Diagnosis not present

## 2014-12-25 DIAGNOSIS — E669 Obesity, unspecified: Secondary | ICD-10-CM | POA: Diagnosis not present

## 2014-12-25 DIAGNOSIS — Z86711 Personal history of pulmonary embolism: Secondary | ICD-10-CM | POA: Diagnosis not present

## 2014-12-25 DIAGNOSIS — Z96642 Presence of left artificial hip joint: Secondary | ICD-10-CM | POA: Diagnosis not present

## 2014-12-25 DIAGNOSIS — G4700 Insomnia, unspecified: Secondary | ICD-10-CM | POA: Diagnosis not present

## 2014-12-25 DIAGNOSIS — R234 Changes in skin texture: Secondary | ICD-10-CM

## 2014-12-25 LAB — URINALYSIS, ROUTINE W REFLEX MICROSCOPIC
Bilirubin Urine: NEGATIVE
Glucose, UA: NEGATIVE mg/dL
Ketones, ur: NEGATIVE mg/dL
Nitrite: POSITIVE — AB
Protein, ur: NEGATIVE mg/dL
Specific Gravity, Urine: 1.023 (ref 1.005–1.030)
pH: 5 (ref 5.0–8.0)

## 2014-12-25 LAB — CBC
HCT: 38.5 % (ref 36.0–46.0)
Hemoglobin: 12.3 g/dL (ref 12.0–15.0)
MCH: 30 pg (ref 26.0–34.0)
MCHC: 31.9 g/dL (ref 30.0–36.0)
MCV: 93.9 fL (ref 78.0–100.0)
Platelets: 756 10*3/uL — ABNORMAL HIGH (ref 150–400)
RBC: 4.1 MIL/uL (ref 3.87–5.11)
RDW: 16.6 % — ABNORMAL HIGH (ref 11.5–15.5)
WBC: 6.7 10*3/uL (ref 4.0–10.5)

## 2014-12-25 LAB — BASIC METABOLIC PANEL
Anion gap: 7 (ref 5–15)
BUN: 9 mg/dL (ref 6–20)
CO2: 26 mmol/L (ref 22–32)
Calcium: 9.2 mg/dL (ref 8.9–10.3)
Chloride: 109 mmol/L (ref 101–111)
Creatinine, Ser: 0.38 mg/dL — ABNORMAL LOW (ref 0.44–1.00)
GFR calc Af Amer: >60 mL/min (ref >60)
GFR calc non Af Amer: >60 mL/min (ref >60)
Glucose, Bld: 84 mg/dL (ref 65–99)
Potassium: 4.6 mmol/L (ref 3.5–5.1)
Sodium: 142 mmol/L (ref 135–145)

## 2014-12-25 LAB — URINE MICROSCOPIC-ADD ON

## 2014-12-25 LAB — WET PREP, GENITAL
Clue Cells Wet Prep HPF POC: NONE SEEN
Sperm: NONE SEEN
Trich, Wet Prep: NONE SEEN
Yeast Wet Prep HPF POC: NONE SEEN

## 2014-12-25 MED ORDER — CEPHALEXIN 500 MG PO CAPS
500.0000 mg | ORAL_CAPSULE | Freq: Three times a day (TID) | ORAL | Status: DC
Start: 2014-12-25 — End: 2015-02-04

## 2014-12-25 MED ORDER — IOHEXOL 300 MG/ML  SOLN
100.0000 mL | Freq: Once | INTRAMUSCULAR | Status: AC | PRN
  Administered 2014-12-25: 100 mL via INTRAVENOUS

## 2014-12-25 MED ORDER — CEPHALEXIN 250 MG PO CAPS
500.0000 mg | ORAL_CAPSULE | Freq: Once | ORAL | Status: AC
  Administered 2014-12-25: 500 mg via ORAL
  Filled 2014-12-25: qty 2

## 2014-12-25 NOTE — ED Provider Notes (Signed)
CSN: [id number]     Arrival date & time 12/25/14  1213 History   First MD Initiated Contact with Patient 12/25/14 1427     Chief Complaint  Patient presents with  . Dysuria   (Consider location/radiation/quality/duration/timing/severity/associated sxs/prior Treatment) HPI 81 y.o. female with a hx of a stroke in March, presents to the Emergency Department today complaining of dysuria. Hx was obtained from daughter due to pts inability to communicate due to the stroke. States that for the past 3 months, the patient has been having on and off abdominal pain as well as burning with urination. Daughter notes that the patient seems uncomfortable when urinating. Has been treated with abx twice in those 3 months. Daughter noticed odor again today as well as tan spots on the front of the patient's underwear. Family also notes some L hip pain. No prior trauma.    Past Medical History  Diagnosis Date  . HYPERTENSION   . HYPERTHYROIDISM   . OBESITY   . ARTHRITIS   . Posttraumatic stress disorder   . INSOMNIA   . MIGRAINE HEADACHE   . Hyperthyroidism     s/p I-131 ablation 03/2011 of multinod goiter  . Arthritis   . Stroke [facility]) 03/2014    dysarthria  . Diverticulosis   . Pulmonary embolism ([place]) 03/2014    with DVT   Past Surgical History  Procedure Laterality Date  . Abdominal hysterectomy  1976  . Total hip arthroplasty  1998     right  . Tonsillectomy    . Breast surgery      biopsy  . Radiology with anesthesia Left 03/08/2014    Procedure: RADIOLOGY WITH ANESTHESIA;  Surgeon: Rob Hickman, MD;  Location: [place];  Service: Radiology;  Laterality: Left;   Family History  Problem Relation Age of Onset  . Asthma Mother   . Asthma Father   . Prostate cancer Father   . Stroke Brother    Social History  Substance Use Topics  . Smoking status: Never Smoker   . Smokeless tobacco: Never Used  . Alcohol Use: No   OB History    No data available     Review of Systems    Constitutional: Negative for fever, chills, diaphoresis and fatigue.  HENT: Negative for congestion, sinus pressure, sore throat and tinnitus.   Eyes: Negative for visual disturbance.  Respiratory: Negative for cough and shortness of breath.   Cardiovascular: Negative for chest pain.  Gastrointestinal: Negative for nausea, vomiting, abdominal pain, diarrhea and constipation.  Endocrine: Negative for cold intolerance and heat intolerance.  Genitourinary: Positive for dysuria. Negative for urgency, hematuria, flank pain, vaginal bleeding and difficulty urinating.  Musculoskeletal: Negative for back pain.  Skin: Negative for color change.  Neurological: Negative for dizziness, syncope, weakness, numbness and headaches.    Allergies  Review of patient's allergies indicates no known allergies.  Home Medications   Prior to Admission medications   Medication Sig Start Date End Date Taking? Authorizing Provider  acetaminophen (TYLENOL) 325 MG tablet Take 1-2 tablets (325-650 mg total) by mouth every 4 (four) hours as needed for mild pain. 04/05/14   [name], PA-C  ciclopirox (PENLAC) 8 % solution Apply topically at bedtime. Apply over nail and surrounding skin. Apply daily over previous coat. After seven (7) days, may remove with alcohol and continue cycle. 11/26/14   Rowe Clack, MD  diclofenac sodium (VOLTAREN) 1 % GEL Apply 2 g topically 3 (three) times daily. 07/26/14   Marcello Moores  Evalina Field, MD  gabapentin (NEURONTIN) 100 MG capsule Take 1 capsule (100 mg total) by mouth 3 (three) times daily. 07/26/14   Janith Lima, MD  guaiFENesin-dextromethorphan (ROBITUSSIN DM) 100-10 MG/5ML syrup Take 5 mLs by mouth every 4 (four) hours as needed for cough. 12/07/14   [name], MD  Multiple Vitamins-Minerals (DECUBI-VITE) CAPS Take by mouth. Take 1 capsule by mouth daily for wound healing    Historical Provider, MD  omeprazole (PRILOSEC) 20 MG capsule Take 1 capsule (20 mg total) by mouth  daily. On an empty stomach 07/26/14   Janith Lima, MD  Probiotic Product (ALIGN) 4 MG CAPS Take by mouth daily.      Historical Provider, MD  propranolol (INDERAL) 10 MG tablet Take 10 mg by mouth as needed.    Historical Provider, MD  senna-docusate (SENOKOT-S) 8.6-50 MG per tablet Take 2 tablets by mouth at bedtime.    Historical Provider, MD  traMADol Veatrice Bourbon) 50 MG tablet One po QID as needed for pain 08/21/14   Janith Lima, MD  traZODone (DESYREL) 50 MG tablet Take 1 tablet (50 mg total) by mouth at bedtime as needed for sleep. 07/26/14   Janith Lima, MD  warfarin (COUMADIN) 4 MG tablet Take 6 mg (1 tablet) on Mon/Wed/Fri. And take 4 mg (1 1/2 tablets) on Sun/Tues/Thurs/Sat. Or as directed. 11/13/14   [name], MD   BP 112/78 mmHg  Pulse 86  Temp(Src) 98.1 F (36.7 C) (Oral)  Resp 17  SpO2 98% Physical Exam  Constitutional: She appears well-developed and well-nourished.  HENT:  Head: Normocephalic and atraumatic.  Neck: Neck supple.  Cardiovascular: Normal rate and regular rhythm.   Pulmonary/Chest: Effort normal and breath sounds normal.  Abdominal: Soft. Normal appearance. There is tenderness in the suprapubic area. There is no rebound, no tenderness at McBurney's point and negative Murphy's sign.  Genitourinary: No bleeding in the vagina. No vaginal discharge found.    Neurological: She is alert.  Skin: Skin is warm and dry.  Psychiatric: She has a normal mood and affect.   Exam performed by Ozella Rocks,  exam chaperoned Date: 12/25/2014 Pelvic exam: normal external genitalia without evidence of trauma. VULVA: normal appearing vulva with no masses, tenderness or lesion. VAGINA: normal appearing vagina with normal color . Total Hysterectomy in 1976. Evidence of fecal material and white discharge noted on vaginal walls.    ED Course  Procedures (including critical care time) Labs Review Labs Reviewed  WET PREP, GENITAL - Abnormal; Notable for the following:      WBC, Wet Prep HPF POC MODERATE (*)    All other components within normal limits  URINALYSIS, ROUTINE W REFLEX MICROSCOPIC (NOT AT [facility]) - Abnormal; Notable for the following:    APPearance CLOUDY (*)    Hgb urine dipstick MODERATE (*)    Nitrite POSITIVE (*)    Leukocytes, UA MODERATE (*)    All other components within normal limits  URINE MICROSCOPIC-ADD ON - Abnormal; Notable for the following:    Squamous Epithelial / LPF 0-5 (*)    Bacteria, UA MANY (*)    All other components within normal limits  CBC - Abnormal; Notable for the following:    RDW 16.6 (*)    Platelets 756 (*)    All other components within normal limits  BASIC METABOLIC PANEL - Abnormal; Notable for the following:    Creatinine, Ser 0.38 (*)    All other components within normal limits  URINE CULTURE   Imaging Review Dg Hip Unilat With Pelvis 2-3 Views Left  12/25/2014  CLINICAL DATA:  LEFT hip pain, prior stroke, patient nonverbal and unable to provide additional history EXAM: DG HIP (WITH OR WITHOUT PELVIS) 2-3V LEFT COMPARISON:  None FINDINGS: Marked osseous demineralization. Components of RIGHT hip prosthesis without gross evidence of dislocation. Rotated to the RIGHT. Minimal narrowing of LEFT hip joint. SI joints grossly preserved. Degenerative disc and facet disease changes at visualized lower lumbar spine. Increased stool in rectum. No acute fracture, dislocation or bone destruction. IMPRESSION: Osseous demineralization with prior RIGHT hip replacement. No definite acute bony abnormalities. Electronically Signed   By: Lavonia Dana M.D.   On: 12/25/2014 15:56   I have personally reviewed and evaluated these images and lab results as part of my medical decision-making.   EKG Interpretation None     MDM  I have reviewed relevant laboratory values. I have reviewed relevant imaging studies. I have reviewed the relevant previous healthcare records. I obtained HPI from daughter due to patient unable to  effectively communicate. Cases discussed with Attending Physician  ED Course: Xray L Hip due to family request and pt discomfort on exam  Pelvic Exam  Assessment: 75y F with hx stroke in March 2016 and hysterectomy in 1976 presents with Dysuria. Pt has hx of UTI. Pelvic exam showed white discharge as well as fecal material. Worrisome for recto-vaginal fistula. Will order CT Abdomen/pelvis for further evaluation. If neg, will treat UTI.    Disposition/Plan:  Evaluate for Recto-vaginal fistula. If none, will treat UTI outpt   Pt signed out to: Dr. Ashok Cordia    Patient was discussed with Noemi Chapel, MD    Final diagnoses:  None       Shary Decamp, PA-C 12/25/14 1843  Noemi Chapel, MD 12/26/14 2147

## 2014-12-25 NOTE — Telephone Encounter (Signed)
Spoke with pts daughter to inform Letter was ready for pick-up

## 2014-12-25 NOTE — ED Notes (Signed)
Provided patient with apple juice, tolerating well.

## 2014-12-25 NOTE — Discharge Instructions (Signed)
It was our pleasure to provide your ER care today - we hope that you feel better.  The lab tests show a probable urine infection - drink plenty of fluids. Take antibiotic as prescribed. A urine culture was sent the results of which will be back in 2 days time - have your doctor follow up on that result then.   Your ct scan was read as follows:  No acute intra-abdominal abnormality is seen. Increased density in the right breast and skin thickening. This is likely related to venous engorgement due to multiple collaterals. Correlation with the physical exam is recommended.  Incidental note was also made of a gallstone, and kidney cysts on your scan.   Follow up with your primary care doctor in the next 1-2 weeks - have them review your ct scan then.  Also for vaginal issues/discharge, follow up with gynecologist as planned.  Return to ER if worse, new symptoms, fevers, persistent vomiting, severe abdominal pain, other concern.    Urinary Tract Infection Urinary tract infections (UTIs) can develop anywhere along your urinary tract. Your urinary tract is your body's drainage system for removing wastes and extra water. Your urinary tract includes two kidneys, two ureters, a bladder, and a urethra. Your kidneys are a pair of bean-shaped organs. Each kidney is about the size of your fist. They are located below your ribs, one on each side of your spine. CAUSES Infections are caused by microbes, which are microscopic organisms, including fungi, viruses, and bacteria. These organisms are so small that they can only be seen through a microscope. Bacteria are the microbes that most commonly cause UTIs. SYMPTOMS  Symptoms of UTIs may vary by age and gender of the patient and by the location of the infection. Symptoms in young women typically include a frequent and intense urge to urinate and a painful, burning feeling in the bladder or urethra during urination. Older women and men are more likely to be  tired, shaky, and weak and have muscle aches and abdominal pain. A fever may mean the infection is in your kidneys. Other symptoms of a kidney infection include pain in your back or sides below the ribs, nausea, and vomiting. DIAGNOSIS To diagnose a UTI, your caregiver will ask you about your symptoms. Your caregiver will also ask you to provide a urine sample. The urine sample will be tested for bacteria and white blood cells. White blood cells are made by your body to help fight infection. TREATMENT  Typically, UTIs can be treated with medication. Because most UTIs are caused by a bacterial infection, they usually can be treated with the use of antibiotics. The choice of antibiotic and length of treatment depend on your symptoms and the type of bacteria causing your infection. HOME CARE INSTRUCTIONS  If you were prescribed antibiotics, take them exactly as your caregiver instructs you. Finish the medication even if you feel better after you have only taken some of the medication.  Drink enough water and fluids to keep your urine clear or pale yellow.  Avoid caffeine, tea, and carbonated beverages. They tend to irritate your bladder.  Empty your bladder often. Avoid holding urine for long periods of time.  Empty your bladder before and after sexual intercourse.  After a bowel movement, women should cleanse from front to back. Use each tissue only once. SEEK MEDICAL CARE IF:   You have back pain.  You develop a fever.  Your symptoms do not begin to resolve within 3 days. SEEK IMMEDIATE  MEDICAL CARE IF:   You have severe back pain or lower abdominal pain.  You develop chills.  You have nausea or vomiting.  You have continued burning or discomfort with urination. MAKE SURE YOU:   Understand these instructions.  Will watch your condition.  Will get help right away if you are not doing well or get worse.   This information is not intended to replace advice given to you by your  health care provider. Make sure you discuss any questions you have with your health care provider.   Document Released: 10/01/2004 Document Revised: 09/12/2014 Document Reviewed: 01/30/2011 Elsevier Interactive Patient Education 2016 [employer].     Cholelithiasis Cholelithiasis (also called gallstones) is a form of gallbladder disease in which gallstones form in your gallbladder. The gallbladder is an organ that stores bile made in the liver, which helps digest fats. Gallstones begin as small crystals and slowly grow into stones. Gallstone pain occurs when the gallbladder spasms and a gallstone is blocking the duct. Pain can also occur when a stone passes out of the duct.  RISK FACTORS  Being female.   Having multiple pregnancies. Health care providers sometimes advise removing diseased gallbladders before future pregnancies.   Being obese.  Eating a diet heavy in fried foods and fat.   Being older than 13 years and increasing age.   Prolonged use of medicines containing female hormones.   Having diabetes mellitus.   Rapidly losing weight.   Having a family history of gallstones (heredity).  SYMPTOMS  Nausea.   Vomiting.  Abdominal pain.   Yellowing of the skin (jaundice).   Sudden pain. It may persist from several minutes to several hours.  Fever.   Tenderness to the touch. In some cases, when gallstones do not move into the bile duct, people have no pain or symptoms. These are called "silent" gallstones.  TREATMENT Silent gallstones do not need treatment. In severe cases, emergency surgery may be required. Options for treatment include:  Surgery to remove the gallbladder. This is the most common treatment.  Medicines. These do not always work and may take 6-12 months or more to work.  Shock wave treatment (extracorporeal biliary lithotripsy). In this treatment an ultrasound machine sends shock waves to the gallbladder to break gallstones into  smaller pieces that can pass into the intestines or be dissolved by medicine. HOME CARE INSTRUCTIONS   Only take over-the-counter or prescription medicines for pain, discomfort, or fever as directed by your health care provider.   Follow a low-fat diet until seen again by your health care provider. Fat causes the gallbladder to contract, which can result in pain.   Follow up with your health care provider as directed. Attacks are almost always recurrent and surgery is usually required for permanent treatment.  SEEK IMMEDIATE MEDICAL CARE IF:   Your pain increases and is not controlled by medicines.   You have a fever or persistent symptoms for more than 2-3 days.   You have a fever and your symptoms suddenly get worse.   You have persistent nausea and vomiting.  MAKE SURE YOU:   Understand these instructions.  Will watch your condition.  Will get help right away if you are not doing well or get worse.   This information is not intended to replace advice given to you by your health care provider. Make sure you discuss any questions you have with your health care provider.   Document Released: 12/18/2004 Document Revised: 08/24/2012 Document Reviewed: 06/15/2012 Elsevier  Interactive Patient Education ©2016 Elsevier Inc. ° °

## 2014-12-25 NOTE — ED Provider Notes (Signed)
Signed out by Dr Sabra Heck at [phone] that pt w uti, and to d/c to home when ct resulted.  Ct results noted.  Discussed w pt/family, including gallstones - no sign cholecystitis on ct, labs, and no ruq tenderness on exam. Pt afeb.  Discussed right breast thickening/density - family indicates has been recently evaluated at breast center for same, including mammogram, and no mass found.   On exam, no breast abscess or cellulitis. No mass felt.     Family notes recurrent vaginal d/c x 3-4 months, for which has upcoming appt at [facility] ob/gyn - will keep that appt.  abd soft nt. No emesis. Afeb.   Pt currently appears stable for d/c.      Lajean Saver, MD 12/25/14 1945

## 2014-12-25 NOTE — ED Notes (Addendum)
Patient here with daughter who reports strong odor of urine, and possible yeast infection for the past week. Patient non-verbal due to past stroke. Unable to void, has to be cathed per daughter for urine

## 2014-12-25 NOTE — ED Notes (Signed)
Patient's daughters verbalized understanding of discharge instructions and medication and deny any further needs or questions at this time. VS stable, patient transferred to wheelchair and assisted to ED entrance.

## 2014-12-25 NOTE — ED Notes (Signed)
Patient rolled skin breakdown noted to the sacrum, left and right inner thighs. Barrier cream applied.

## 2014-12-26 ENCOUNTER — Ambulatory Visit: Payer: Medicare Other | Admitting: Podiatry

## 2014-12-27 ENCOUNTER — Encounter: Payer: Self-pay | Admitting: Physical Therapy

## 2014-12-27 DIAGNOSIS — H918X3 Other specified hearing loss, bilateral: Secondary | ICD-10-CM | POA: Diagnosis not present

## 2014-12-27 NOTE — Therapy (Signed)
[facility] [address] [place], [place], [address] Phone: [phone]   Fax:  [phone]  Patient Details  Name: Sandy Barrett MRN: [id number] Date of Birth: 07-16-1932 Referring Provider:  Alysia Penna, MD  Encounter Date: 12/07/2014  PHYSICAL THERAPY DISCHARGE SUMMARY  Visits from Start of Care:  17  Current functional level related to goals / functional outcomes:     PT Long Term Goals - 12/07/14 1658    PT LONG TERM GOAL #1   Title Family demonstrates updated HEP and verbalizes benefits for decreasing LE pain associated with stiffness.  (NEW Target Date: 01/11/2015)   Baseline 12/07/2014: Daughter reports understanding basic HEP but needs instruction in updated HEP. HHPT to resume and continue with home exercise.   Time 8   Period Weeks   Status Not Met   PT LONG TERM GOAL #2   Title Family performs transfer with patient with sliding board safely. (NEW Target Date: 01/11/2015)   Baseline 12/07/2014: Patient's daughter reports she was able to perform sliding board transfer from bed to w/c with min assist from [facility]  at home safely.     Time 8   Period Weeks   Status Achieved   PT LONG TERM GOAL #3   Title Patient able to reach 5" anteriorly, left & across midline with left UE sitting at edge of mat or w/c with feet on floor with supervision.  (Target Date: 11/16/2014)   Baseline 12/07/2014: pt. unable to safely reach 5" anteriorly and left across midline with L upper extremity while sitting without physical assistance.     Time 8   Period Weeks   Status Not Met   PT LONG TERM GOAL #4   Title Patient able to roll left & right with rail and boost buttocks in bed to aide with self-care with cues only.   (NEW Target Date: 01/11/2015)   Baseline 12/07/2014: Pt continues to need up to total assist at times.   Time 8   Period Weeks   Status Not Met   PT LONG TERM GOAL #5   Title Patient sit to stand pulling on  parallel bar with moderate assist and maintain standing for 2 minutes with MinA.  (NEW Target Date: 01/11/2015)   Baseline 12/07/2014: Pt. unable to achieve standing today in session with 2 person assist.   Time 8   Period Weeks   Status Not Met       Remaining deficits: SEE ABOVE   Education / Equipment: HEP & transfers  Plan: Patient agrees to discharge.  Patient goals were not met. Patient is being discharged due to the patient's request.  ?????       Shanaye Rief PT, DPT 12/27/2014, 12:42 PM  [facility] [address] [place], [place], [address] Phone: [phone]   Fax:  [phone]

## 2014-12-28 ENCOUNTER — Ambulatory Visit: Payer: Medicare Other | Admitting: Physical Therapy

## 2014-12-29 LAB — URINE CULTURE: Culture: >=100000

## 2014-12-30 ENCOUNTER — Telehealth (HOSPITAL_COMMUNITY): Payer: Self-pay

## 2014-12-30 NOTE — Telephone Encounter (Signed)
Post ED Visit - Positive Culture Follow-up  Culture report reviewed by antimicrobial stewardship pharmacist:  []  Elenor Quinones, Pharm.D. []  Heide Guile, Pharm.D., BCPS []  Parks Neptune, Pharm.D. []  Alycia Rossetti, Pharm.D., BCPS []  Murdo, Pharm.D., BCPS, AAHIVP []  Legrand Como, Pharm.D., BCPS, AAHIVP []  Milus Glazier, Pharm.D. []  Stephens November, Pharm.DMadilyn Fireman Masters, Pharm.D.  Positive urine culture, >/= 100,000 colonies -> Klebsiella Pneumoniae   Treated with Cephalexin, organism sensitive to the same and no further patient follow-up is required at this time.  Dortha Kern 12/30/2014, 7:05 PM

## 2015-01-01 ENCOUNTER — Encounter: Payer: Self-pay | Admitting: Emergency Medicine

## 2015-01-01 ENCOUNTER — Ambulatory Visit (INDEPENDENT_AMBULATORY_CARE_PROVIDER_SITE_OTHER): Payer: Medicare Other | Admitting: General Practice

## 2015-01-01 ENCOUNTER — Telehealth: Payer: Self-pay | Admitting: Internal Medicine

## 2015-01-01 ENCOUNTER — Ambulatory Visit: Payer: Medicare Other | Admitting: Physical Therapy

## 2015-01-01 DIAGNOSIS — I82403 Acute embolism and thrombosis of unspecified deep veins of lower extremity, bilateral: Secondary | ICD-10-CM | POA: Diagnosis not present

## 2015-01-01 DIAGNOSIS — Z5181 Encounter for therapeutic drug level monitoring: Secondary | ICD-10-CM | POA: Diagnosis not present

## 2015-01-01 DIAGNOSIS — I639 Cerebral infarction, unspecified: Secondary | ICD-10-CM

## 2015-01-01 DIAGNOSIS — G8191 Hemiplegia, unspecified affecting right dominant side: Secondary | ICD-10-CM

## 2015-01-01 DIAGNOSIS — I63311 Cerebral infarction due to thrombosis of right middle cerebral artery: Secondary | ICD-10-CM | POA: Diagnosis not present

## 2015-01-01 DIAGNOSIS — I2699 Other pulmonary embolism without acute cor pulmonale: Secondary | ICD-10-CM

## 2015-01-01 DIAGNOSIS — R4182 Altered mental status, unspecified: Secondary | ICD-10-CM

## 2015-01-01 LAB — POCT INR: INR: 2.2

## 2015-01-01 NOTE — Progress Notes (Signed)
Pre visit review using our clinic review tool, if applicable. No additional management support is needed unless otherwise documented below in the visit note. 

## 2015-01-01 NOTE — Telephone Encounter (Signed)
The best number for her daughter [name] is [phone]

## 2015-01-03 NOTE — Telephone Encounter (Signed)
Ok with me 

## 2015-01-03 NOTE — Telephone Encounter (Signed)
[name], daughter, called request new referral for home health to be put on from Dr. Quay Burow to go to [place]. She said that the one that Dr. Asa Lente did a month ago is not good, so they need new referral put on again. Please help, pt has bed sore that need the nurse ASAP.   [name] is [phone]

## 2015-01-04 ENCOUNTER — Ambulatory Visit: Payer: Medicare Other | Admitting: Physical Therapy

## 2015-01-04 NOTE — Telephone Encounter (Signed)
It could be medication related or an infection.  Depending on other symptoms she should have it evaluated - she has had a stroke in the past

## 2015-01-04 NOTE — Telephone Encounter (Signed)
Notified pt daughter Per [facility] she got confirmation from [facility] should be contacting them soon, but did tell her if she feels that the symptoms are getting worse to take her to ER...[name]

## 2015-01-04 NOTE — Telephone Encounter (Signed)
Notified daughter with md response. Daughter states she was seen at [facility] ER 12/20, and was dx with UTI completed last antibiotic (Keflex) yesterday, but not sure if it has cleared up. Mom is incontinent and if UA is needed to be recheck she has a catheter. She stated the hallucination did start after she had the stroke she notice it while she was at [facility], but it wasn't a everyday. For the past 2 weeks she been seeing things everyday...[name]

## 2015-01-04 NOTE — Telephone Encounter (Signed)
Lets check urine - I ordered it for our lab.  If they continue or worsen she needs a full evaluation.

## 2015-01-04 NOTE — Telephone Encounter (Signed)
Notified pt daughter with md response. She states that pt would have to be Catherize to give a specimen, and they have to give 24 hours notice for transporation. She is wanting to see if [facility] can be started ASAP so that the nurse could get UA. Inform daughter will check with [facility] to f/u on the [facility] referral, and ghive her a call back, with status...[name]

## 2015-01-04 NOTE — Telephone Encounter (Signed)
Received call from daughter she states mom has been having some hallucinations stating that she is seeing things in the room that are moving that is not their. Episode last night being afraid going into the room stating it was still there. Wanting md Medical illustrator. ?? could it be her medications.../lmb   Also went ahead and entered [facility] referral concerning msg below...[name]

## 2015-01-08 ENCOUNTER — Telehealth: Payer: Self-pay | Admitting: Internal Medicine

## 2015-01-08 ENCOUNTER — Ambulatory Visit: Payer: Medicare Other | Admitting: Physical Therapy

## 2015-01-08 DIAGNOSIS — I69351 Hemiplegia and hemiparesis following cerebral infarction affecting right dominant side: Secondary | ICD-10-CM | POA: Diagnosis not present

## 2015-01-08 DIAGNOSIS — Z7901 Long term (current) use of anticoagulants: Secondary | ICD-10-CM | POA: Diagnosis not present

## 2015-01-08 DIAGNOSIS — N390 Urinary tract infection, site not specified: Secondary | ICD-10-CM | POA: Diagnosis not present

## 2015-01-08 DIAGNOSIS — I10 Essential (primary) hypertension: Secondary | ICD-10-CM | POA: Diagnosis not present

## 2015-01-08 DIAGNOSIS — S71101D Unspecified open wound, right thigh, subsequent encounter: Secondary | ICD-10-CM | POA: Diagnosis not present

## 2015-01-08 DIAGNOSIS — E669 Obesity, unspecified: Secondary | ICD-10-CM | POA: Diagnosis not present

## 2015-01-08 DIAGNOSIS — Z87440 Personal history of urinary (tract) infections: Secondary | ICD-10-CM | POA: Diagnosis not present

## 2015-01-08 DIAGNOSIS — Z86711 Personal history of pulmonary embolism: Secondary | ICD-10-CM | POA: Diagnosis not present

## 2015-01-08 NOTE — Telephone Encounter (Signed)
Danielle from [facility] will be seeing patient around 11am this morning and pt's daughter had stated that Dr. Quay Burow wanted them to get a urine sample. She does not see an order for it. Please call her at [phone]

## 2015-01-08 NOTE — Telephone Encounter (Signed)
LVM informing Sandy Barrett that Dr Quay Burow does want a Urine Sample on pt.

## 2015-01-10 ENCOUNTER — Ambulatory Visit: Payer: Medicare Other | Admitting: Physical Therapy

## 2015-01-10 ENCOUNTER — Telehealth: Payer: Self-pay | Admitting: Internal Medicine

## 2015-01-10 NOTE — Telephone Encounter (Signed)
There are no UA results in the computer and I do not recall seeing a paper copy of a UA for her.  Call home health regarding UA or see if patient can be seen.  Should ideally be seen for breast pain as well.

## 2015-01-10 NOTE — Telephone Encounter (Signed)
LVM for home health nurse, Andee Poles, to call back.

## 2015-01-10 NOTE — Telephone Encounter (Signed)
Spoke with Andee Poles and had UA results faxed to our office.

## 2015-01-10 NOTE — Telephone Encounter (Signed)
PLEASE NOTE: All timestamps contained within this report are represented as [place] Standard Time. CONFIDENTIALTY NOTICE: This fax transmission is intended only for the addressee. It contains information that is legally privileged, confidential or otherwise protected from use or disclosure. If you are not the intended recipient, you are strictly prohibited from reviewing, disclosing, copying using or disseminating any of this information or taking any action in reliance on or regarding this information. If you have received this fax in error, please notify us immediately by telephone so that we can arrange for its return to [place]. Phone: [phone], Toll-Free: [phone], Fax: [phone] Page: 1 of 1 Call Id: [id number] [place] Patient Name: HANNAHLEE Emig Gender: Female DOB: 06-07-1932 Age: 82 Y 28 M 23 D Return Phone Number: [phone] (Primary), [phone] (Secondary) Address: City/State/Zip: [place] [employer] [facility] - Client Client Site [facility] Day Physician Billey Gosling Contact Type Call Call Type Triage / [place] Name [name] Relationship To Patient Daughter Appointment Disposition EMR Appointment Not Necessary Info pasted into Epic Yes Return Phone Number [phone] (Primary) Chief Complaint Breast Symptoms Initial Comment caller states mothers right breast is swollen, wants to know if results of U/A are in yet Nurse Assessment Guidelines Guideline Title Affirmed Question Affirmed Notes Nurse Date/Time ([place] Time) Disp. Time Eilene Ghazi Time) Disposition Final User 01/10/2015 9:45:13 AM Send To Clinical Follow Up Jeral Fruit 01/10/2015 10:56:59 AM Clinical Call Yes Mechele Dawley, RN, [facility] Instructions Given Call Event Type User Date / Time Description Comments User: Susanne Borders, RN Date/Time Eilene Ghazi Time): 01/10/2015  10:56:05 AM DAUGHTER IS REPORTING RIGHT BREAST SWOLLEN, AND HARD TO THE TOUCH. NO DISCOLORATION. SHE RECENTLY HAD A MAMMOGRAM AND DISCUSSED A COUPLE OF THINGS AND NOTHING OF MAJOR CONCERN. SHE IS COMPLAINING THAT IT IS HURTING. THIS HAS BEEN GOING ON FOR 2 DAYS NOW. THE DAUGHTER IS ALSO WANTING TO KNOW THE RESULTS OF THE UA. THE MOM IS NOW [place]. THE HOME HEALTH NURSE IS THE ONE THAT DID THE UA ON THE 30TH. CALLED THE OFFICE AND INFORMED THEM OF THIS INFORMATION TO SEE WHAT DR BURNS WOULD LIKE - IF PATIENT NEEDS TO BE BROUGHT IN FOR APPT OR IF HOME HEALTH NEEDS TO SEE PATIENT FOR THE RIGHT BREAST SYMPTOMS. THEY ARE GOING TO CALL THE PATIENTS DAUGHTER BACK DIRECTLY. CALLED THE DAUGHTER BACK AND INFORMED HER THAT SHE CAN EXPECT A CALL FROM THE OFFICE DIRECTLY WITH THE ANSWERS. SHE VERBALIZED UNDERSTANDING.

## 2015-01-11 DIAGNOSIS — E669 Obesity, unspecified: Secondary | ICD-10-CM | POA: Diagnosis not present

## 2015-01-11 DIAGNOSIS — I69351 Hemiplegia and hemiparesis following cerebral infarction affecting right dominant side: Secondary | ICD-10-CM | POA: Diagnosis not present

## 2015-01-11 DIAGNOSIS — Z87440 Personal history of urinary (tract) infections: Secondary | ICD-10-CM | POA: Diagnosis not present

## 2015-01-11 DIAGNOSIS — Z86711 Personal history of pulmonary embolism: Secondary | ICD-10-CM | POA: Diagnosis not present

## 2015-01-11 DIAGNOSIS — S71101D Unspecified open wound, right thigh, subsequent encounter: Secondary | ICD-10-CM | POA: Diagnosis not present

## 2015-01-11 DIAGNOSIS — I10 Essential (primary) hypertension: Secondary | ICD-10-CM | POA: Diagnosis not present

## 2015-01-11 LAB — POCT INR
INR: 1.6
INR: 1.6

## 2015-01-11 MED ORDER — CIPROFLOXACIN HCL 250 MG PO TABS
250.0000 mg | ORAL_TABLET | Freq: Two times a day (BID) | ORAL | Status: DC
Start: 2015-01-11 — End: 2015-02-04

## 2015-01-11 NOTE — Telephone Encounter (Signed)
Spoke with daughter Sandy Barrett to inform. She will call back for an appt next week to evaluate the other issues that the pt is having. She asked if you were okay with the home health nurse taking the pts INR when they come out for a visit.

## 2015-01-11 NOTE — Telephone Encounter (Addendum)
She does have bacteria in her urine.  I sent a prescription to her pharmacy.  This can influence her warfarin level and she should ideally see cindy on Monday.

## 2015-01-11 NOTE — Addendum Note (Signed)
Addended by: [name] on: 01/11/2015 08:10 AM   Modules accepted: Orders

## 2015-01-11 NOTE — Telephone Encounter (Signed)
I would rather she came in to see cindy because as we saw with the urine there is a big delay in me getting those results and if the INR is too high or too low and it can be extremely dangerous

## 2015-01-14 DIAGNOSIS — E669 Obesity, unspecified: Secondary | ICD-10-CM | POA: Diagnosis not present

## 2015-01-14 DIAGNOSIS — Z87440 Personal history of urinary (tract) infections: Secondary | ICD-10-CM | POA: Diagnosis not present

## 2015-01-14 DIAGNOSIS — S71101D Unspecified open wound, right thigh, subsequent encounter: Secondary | ICD-10-CM | POA: Diagnosis not present

## 2015-01-14 DIAGNOSIS — I69351 Hemiplegia and hemiparesis following cerebral infarction affecting right dominant side: Secondary | ICD-10-CM | POA: Diagnosis not present

## 2015-01-14 DIAGNOSIS — Z86711 Personal history of pulmonary embolism: Secondary | ICD-10-CM | POA: Diagnosis not present

## 2015-01-14 DIAGNOSIS — I10 Essential (primary) hypertension: Secondary | ICD-10-CM | POA: Diagnosis not present

## 2015-01-14 NOTE — Telephone Encounter (Signed)
Spoke with Daughter Rosanne Ashing, she stated the the pt had started the anti-biotic on Saturday 1/7. Also spoke with Gerald Stabs, RN for [facility], to give verbal orders to take INR levels on 01/15/15. She will call tomorrow with results.

## 2015-01-15 DIAGNOSIS — S71101D Unspecified open wound, right thigh, subsequent encounter: Secondary | ICD-10-CM | POA: Diagnosis not present

## 2015-01-15 DIAGNOSIS — Z87440 Personal history of urinary (tract) infections: Secondary | ICD-10-CM | POA: Diagnosis not present

## 2015-01-15 DIAGNOSIS — I10 Essential (primary) hypertension: Secondary | ICD-10-CM | POA: Diagnosis not present

## 2015-01-15 DIAGNOSIS — E669 Obesity, unspecified: Secondary | ICD-10-CM | POA: Diagnosis not present

## 2015-01-15 DIAGNOSIS — I69351 Hemiplegia and hemiparesis following cerebral infarction affecting right dominant side: Secondary | ICD-10-CM | POA: Diagnosis not present

## 2015-01-15 DIAGNOSIS — Z86711 Personal history of pulmonary embolism: Secondary | ICD-10-CM | POA: Diagnosis not present

## 2015-01-16 ENCOUNTER — Telehealth: Payer: Self-pay | Admitting: Emergency Medicine

## 2015-01-16 DIAGNOSIS — I69351 Hemiplegia and hemiparesis following cerebral infarction affecting right dominant side: Secondary | ICD-10-CM | POA: Diagnosis not present

## 2015-01-16 DIAGNOSIS — E669 Obesity, unspecified: Secondary | ICD-10-CM | POA: Diagnosis not present

## 2015-01-16 DIAGNOSIS — I10 Essential (primary) hypertension: Secondary | ICD-10-CM | POA: Diagnosis not present

## 2015-01-16 DIAGNOSIS — Z86711 Personal history of pulmonary embolism: Secondary | ICD-10-CM | POA: Diagnosis not present

## 2015-01-16 DIAGNOSIS — S71101D Unspecified open wound, right thigh, subsequent encounter: Secondary | ICD-10-CM | POA: Diagnosis not present

## 2015-01-16 DIAGNOSIS — Z87440 Personal history of urinary (tract) infections: Secondary | ICD-10-CM | POA: Diagnosis not present

## 2015-01-16 NOTE — Telephone Encounter (Signed)
Spoke with Gerald Stabs and daughter Rosanne Ashing to inform of MDs response.

## 2015-01-16 NOTE — Telephone Encounter (Signed)
Chris, [facility] nurse, called to give pts INR results for 01/15/2015. INR- 2.6 PT 31.2 Sec. Pt is taking 4 mg daily, except for M,W,F she takes 6 mg. Please advise if there is any changes that need to be made.   CB number for Gerald Stabs [phone]

## 2015-01-16 NOTE — Telephone Encounter (Signed)
No change in treatment needed.

## 2015-01-17 DIAGNOSIS — Z87440 Personal history of urinary (tract) infections: Secondary | ICD-10-CM | POA: Diagnosis not present

## 2015-01-17 DIAGNOSIS — I69351 Hemiplegia and hemiparesis following cerebral infarction affecting right dominant side: Secondary | ICD-10-CM | POA: Diagnosis not present

## 2015-01-17 DIAGNOSIS — I10 Essential (primary) hypertension: Secondary | ICD-10-CM | POA: Diagnosis not present

## 2015-01-17 DIAGNOSIS — E669 Obesity, unspecified: Secondary | ICD-10-CM | POA: Diagnosis not present

## 2015-01-17 DIAGNOSIS — Z86711 Personal history of pulmonary embolism: Secondary | ICD-10-CM | POA: Diagnosis not present

## 2015-01-17 DIAGNOSIS — S71101D Unspecified open wound, right thigh, subsequent encounter: Secondary | ICD-10-CM | POA: Diagnosis not present

## 2015-01-18 DIAGNOSIS — S71101D Unspecified open wound, right thigh, subsequent encounter: Secondary | ICD-10-CM | POA: Diagnosis not present

## 2015-01-18 DIAGNOSIS — Z86711 Personal history of pulmonary embolism: Secondary | ICD-10-CM | POA: Diagnosis not present

## 2015-01-18 DIAGNOSIS — Z87440 Personal history of urinary (tract) infections: Secondary | ICD-10-CM | POA: Diagnosis not present

## 2015-01-18 DIAGNOSIS — E669 Obesity, unspecified: Secondary | ICD-10-CM | POA: Diagnosis not present

## 2015-01-18 DIAGNOSIS — I69351 Hemiplegia and hemiparesis following cerebral infarction affecting right dominant side: Secondary | ICD-10-CM | POA: Diagnosis not present

## 2015-01-18 DIAGNOSIS — I10 Essential (primary) hypertension: Secondary | ICD-10-CM | POA: Diagnosis not present

## 2015-01-21 ENCOUNTER — Telehealth: Payer: Self-pay | Admitting: Internal Medicine

## 2015-01-21 DIAGNOSIS — Z86711 Personal history of pulmonary embolism: Secondary | ICD-10-CM | POA: Diagnosis not present

## 2015-01-21 DIAGNOSIS — E669 Obesity, unspecified: Secondary | ICD-10-CM | POA: Diagnosis not present

## 2015-01-21 DIAGNOSIS — I10 Essential (primary) hypertension: Secondary | ICD-10-CM | POA: Diagnosis not present

## 2015-01-21 DIAGNOSIS — S71101D Unspecified open wound, right thigh, subsequent encounter: Secondary | ICD-10-CM | POA: Diagnosis not present

## 2015-01-21 DIAGNOSIS — I69351 Hemiplegia and hemiparesis following cerebral infarction affecting right dominant side: Secondary | ICD-10-CM | POA: Diagnosis not present

## 2015-01-21 DIAGNOSIS — Z87440 Personal history of urinary (tract) infections: Secondary | ICD-10-CM | POA: Diagnosis not present

## 2015-01-21 NOTE — Telephone Encounter (Signed)
Please advise 

## 2015-01-21 NOTE — Telephone Encounter (Signed)
Sandy Barrett from [facility] states pt has a stage 2 pressure ulcer on her coccyx and is wanting to treat. Pt is also scheduled for an INR on the 27th and since she is there today would like to do today. Please give her a call at [phone]

## 2015-01-21 NOTE — Telephone Encounter (Signed)
LVM for Gerald Stabs with MDs response.

## 2015-01-21 NOTE — Telephone Encounter (Signed)
Ok to do INR today  - will advise when we need it next based on results.  Can we get a visiting nurse to do wound care on the ulcer?

## 2015-01-22 DIAGNOSIS — Z87440 Personal history of urinary (tract) infections: Secondary | ICD-10-CM | POA: Diagnosis not present

## 2015-01-22 DIAGNOSIS — S71101D Unspecified open wound, right thigh, subsequent encounter: Secondary | ICD-10-CM | POA: Diagnosis not present

## 2015-01-22 DIAGNOSIS — I69351 Hemiplegia and hemiparesis following cerebral infarction affecting right dominant side: Secondary | ICD-10-CM | POA: Diagnosis not present

## 2015-01-22 DIAGNOSIS — I10 Essential (primary) hypertension: Secondary | ICD-10-CM | POA: Diagnosis not present

## 2015-01-22 DIAGNOSIS — E669 Obesity, unspecified: Secondary | ICD-10-CM | POA: Diagnosis not present

## 2015-01-22 DIAGNOSIS — Z86711 Personal history of pulmonary embolism: Secondary | ICD-10-CM | POA: Diagnosis not present

## 2015-01-24 ENCOUNTER — Telehealth: Payer: Self-pay | Admitting: Internal Medicine

## 2015-01-24 DIAGNOSIS — Z87440 Personal history of urinary (tract) infections: Secondary | ICD-10-CM | POA: Diagnosis not present

## 2015-01-24 DIAGNOSIS — I69351 Hemiplegia and hemiparesis following cerebral infarction affecting right dominant side: Secondary | ICD-10-CM | POA: Diagnosis not present

## 2015-01-24 DIAGNOSIS — I10 Essential (primary) hypertension: Secondary | ICD-10-CM | POA: Diagnosis not present

## 2015-01-24 DIAGNOSIS — E669 Obesity, unspecified: Secondary | ICD-10-CM | POA: Diagnosis not present

## 2015-01-24 DIAGNOSIS — Z86711 Personal history of pulmonary embolism: Secondary | ICD-10-CM | POA: Diagnosis not present

## 2015-01-24 DIAGNOSIS — S71101D Unspecified open wound, right thigh, subsequent encounter: Secondary | ICD-10-CM | POA: Diagnosis not present

## 2015-01-24 NOTE — Telephone Encounter (Signed)
Please advise 

## 2015-01-24 NOTE — Telephone Encounter (Signed)
I do not know who has a hoyer lift - is there a group at the hospital that would be able to lift her to the exam table?

## 2015-01-24 NOTE — Telephone Encounter (Signed)
Pt's daughter called to let you know the appointment to the [facility] that Dr. Asa Lente referred her to was yesterday. They were unable to do the exam. They did not have a hoyer lift She is wondering what the next step should be. Can you please call her at [phone]

## 2015-01-25 DIAGNOSIS — E669 Obesity, unspecified: Secondary | ICD-10-CM | POA: Diagnosis not present

## 2015-01-25 DIAGNOSIS — Z86711 Personal history of pulmonary embolism: Secondary | ICD-10-CM | POA: Diagnosis not present

## 2015-01-25 DIAGNOSIS — I10 Essential (primary) hypertension: Secondary | ICD-10-CM | POA: Diagnosis not present

## 2015-01-25 DIAGNOSIS — Z87440 Personal history of urinary (tract) infections: Secondary | ICD-10-CM | POA: Diagnosis not present

## 2015-01-25 DIAGNOSIS — S71101D Unspecified open wound, right thigh, subsequent encounter: Secondary | ICD-10-CM | POA: Diagnosis not present

## 2015-01-25 DIAGNOSIS — I69351 Hemiplegia and hemiparesis following cerebral infarction affecting right dominant side: Secondary | ICD-10-CM | POA: Diagnosis not present

## 2015-01-25 NOTE — Telephone Encounter (Signed)
Home health nurse called to infor pts INR was 2.4 and PT 28.6. Please advise if any changes.

## 2015-01-25 NOTE — Telephone Encounter (Signed)
No changes - recheck in 3-4 weeks

## 2015-01-28 ENCOUNTER — Telehealth: Payer: Self-pay | Admitting: Internal Medicine

## 2015-01-28 DIAGNOSIS — S71101D Unspecified open wound, right thigh, subsequent encounter: Secondary | ICD-10-CM | POA: Diagnosis not present

## 2015-01-28 DIAGNOSIS — E669 Obesity, unspecified: Secondary | ICD-10-CM | POA: Diagnosis not present

## 2015-01-28 DIAGNOSIS — Z86711 Personal history of pulmonary embolism: Secondary | ICD-10-CM | POA: Diagnosis not present

## 2015-01-28 DIAGNOSIS — I69351 Hemiplegia and hemiparesis following cerebral infarction affecting right dominant side: Secondary | ICD-10-CM | POA: Diagnosis not present

## 2015-01-28 DIAGNOSIS — Z87440 Personal history of urinary (tract) infections: Secondary | ICD-10-CM | POA: Diagnosis not present

## 2015-01-28 DIAGNOSIS — I10 Essential (primary) hypertension: Secondary | ICD-10-CM | POA: Diagnosis not present

## 2015-01-28 NOTE — Telephone Encounter (Signed)
Please advise 

## 2015-01-28 NOTE — Telephone Encounter (Signed)
Catherine from [facility] is requestiong verbal orders for home health aid 2 x wk 3 wks for bathing &  OT to administer electrical stimulation on shoulder for pain mgmt. Up to 3 x wk for 3 wks.

## 2015-01-28 NOTE — Telephone Encounter (Signed)
Spoke with [name] to inform.

## 2015-01-28 NOTE — Telephone Encounter (Signed)
Spoke with pts daughter, Rosanne Ashing, to inform. Appt with [name] has been cancelled for this week.

## 2015-01-28 NOTE — Telephone Encounter (Signed)
ok 

## 2015-01-28 NOTE — Telephone Encounter (Signed)
Spoke with Sandy Barrett to inform. When speaking with daughter earlier in the day, she stated that her mother was still having hallucinations. Most recent occurred 3 days ago. She stated that her urine odor has decreased a lot but needs to be advised on the hallucinations.

## 2015-01-28 NOTE — Telephone Encounter (Signed)
She needs to be evaluated.

## 2015-01-29 ENCOUNTER — Ambulatory Visit: Payer: Medicare Other | Admitting: Physical Medicine & Rehabilitation

## 2015-01-29 NOTE — Telephone Encounter (Signed)
Spoke with daughter, appt scheduled.

## 2015-01-30 ENCOUNTER — Ambulatory Visit (INDEPENDENT_AMBULATORY_CARE_PROVIDER_SITE_OTHER): Payer: Medicare Other | Admitting: Podiatry

## 2015-01-30 ENCOUNTER — Encounter: Payer: Self-pay | Admitting: Podiatry

## 2015-01-30 VITALS — BP 109/69 | HR 97 | Resp 12

## 2015-01-30 DIAGNOSIS — L609 Nail disorder, unspecified: Secondary | ICD-10-CM | POA: Diagnosis not present

## 2015-01-30 DIAGNOSIS — B351 Tinea unguium: Secondary | ICD-10-CM

## 2015-01-30 DIAGNOSIS — M79675 Pain in left toe(s): Secondary | ICD-10-CM

## 2015-01-30 DIAGNOSIS — L608 Other nail disorders: Secondary | ICD-10-CM

## 2015-01-30 DIAGNOSIS — M79674 Pain in right toe(s): Secondary | ICD-10-CM | POA: Diagnosis not present

## 2015-01-30 NOTE — Patient Instructions (Signed)
Return approximately 4 months for debridement of thick fungal toenails

## 2015-01-30 NOTE — Progress Notes (Signed)
   Subjective:    Patient ID: Sandy Barrett, female    DOB: 1932-11-03, 82 y.o.   MRN: [id number]  HPI    This patient presents today with daughter present to treatment room complaining of thickened and uncomfortable hallux toenails left greater than right over an undetermined amount of time. The hallux nails primary left to become more uncomfortable and thickened when patient has direct shoe pressure over the toe. Patient denies any bacterial like infections or previous podiatric care  Patient has history of stroke with him I paresis right side and is in a seated in a wheelchair and unable to transfer to treatment chair    Review of Systems  Respiratory: Positive for cough.   Cardiovascular: Positive for leg swelling.  Musculoskeletal: Positive for myalgias and gait problem.  Hematological: Bruises/bleeds easily.       Objective:   Physical Exam  Pleasant orientated 3 patient with daughter present to treatment room  Vascular: DP pulses 2/4 bilaterally PT pulses 1/4 bilaterally Capillary reflex immediate bilaterally  Dermatological: The hallux nails left greater right are deformed, discolored and hypertrophic The remaining toenails 8 are incurvated with normal texture and color  Neurological: Ankle reflexes nonreactive right weekly reactive left Sensation to 10 g monofilament wire 0/5 right and 4/5 left  Musculoskeletal: Dorsi flexion and plantar flexion right 0/5 Dorsi flexion and plantar flexion left 5/5        Assessment & Plan:   Assessment: Stroke related right lower extremity weakness Onychomycoses hallux bilaterally Incurvated toenails 8  Plan: Debridement a hallux toenails mechanically atelectatic without any bleeding Debrided remaining incurvated toenails 8 without any bleeding  Reappoint 4 months

## 2015-01-31 DIAGNOSIS — I10 Essential (primary) hypertension: Secondary | ICD-10-CM | POA: Diagnosis not present

## 2015-01-31 DIAGNOSIS — Z87440 Personal history of urinary (tract) infections: Secondary | ICD-10-CM | POA: Diagnosis not present

## 2015-01-31 DIAGNOSIS — Z86711 Personal history of pulmonary embolism: Secondary | ICD-10-CM | POA: Diagnosis not present

## 2015-01-31 DIAGNOSIS — I69351 Hemiplegia and hemiparesis following cerebral infarction affecting right dominant side: Secondary | ICD-10-CM | POA: Diagnosis not present

## 2015-01-31 DIAGNOSIS — S71101D Unspecified open wound, right thigh, subsequent encounter: Secondary | ICD-10-CM | POA: Diagnosis not present

## 2015-01-31 DIAGNOSIS — E669 Obesity, unspecified: Secondary | ICD-10-CM | POA: Diagnosis not present

## 2015-02-01 ENCOUNTER — Ambulatory Visit: Payer: Medicare Other

## 2015-02-01 DIAGNOSIS — Z86711 Personal history of pulmonary embolism: Secondary | ICD-10-CM | POA: Diagnosis not present

## 2015-02-01 DIAGNOSIS — I69351 Hemiplegia and hemiparesis following cerebral infarction affecting right dominant side: Secondary | ICD-10-CM | POA: Diagnosis not present

## 2015-02-01 DIAGNOSIS — E669 Obesity, unspecified: Secondary | ICD-10-CM | POA: Diagnosis not present

## 2015-02-01 DIAGNOSIS — S71101D Unspecified open wound, right thigh, subsequent encounter: Secondary | ICD-10-CM | POA: Diagnosis not present

## 2015-02-01 DIAGNOSIS — I10 Essential (primary) hypertension: Secondary | ICD-10-CM | POA: Diagnosis not present

## 2015-02-01 DIAGNOSIS — Z87440 Personal history of urinary (tract) infections: Secondary | ICD-10-CM | POA: Diagnosis not present

## 2015-02-03 ENCOUNTER — Other Ambulatory Visit: Payer: Self-pay | Admitting: Internal Medicine

## 2015-02-04 ENCOUNTER — Other Ambulatory Visit: Payer: Self-pay | Admitting: Internal Medicine

## 2015-02-04 ENCOUNTER — Encounter: Payer: Medicare Other | Attending: Physical Medicine & Rehabilitation

## 2015-02-04 ENCOUNTER — Encounter: Payer: Self-pay | Admitting: Physical Medicine & Rehabilitation

## 2015-02-04 ENCOUNTER — Ambulatory Visit (HOSPITAL_BASED_OUTPATIENT_CLINIC_OR_DEPARTMENT_OTHER): Payer: Medicare Other | Admitting: Physical Medicine & Rehabilitation

## 2015-02-04 VITALS — BP 133/68 | HR 92

## 2015-02-04 DIAGNOSIS — R4702 Dysphasia: Secondary | ICD-10-CM

## 2015-02-04 DIAGNOSIS — M7541 Impingement syndrome of right shoulder: Secondary | ICD-10-CM | POA: Diagnosis not present

## 2015-02-04 DIAGNOSIS — I69320 Aphasia following cerebral infarction: Secondary | ICD-10-CM | POA: Insufficient documentation

## 2015-02-04 DIAGNOSIS — M6289 Other specified disorders of muscle: Secondary | ICD-10-CM | POA: Diagnosis not present

## 2015-02-04 DIAGNOSIS — R4701 Aphasia: Secondary | ICD-10-CM

## 2015-02-04 DIAGNOSIS — G8190 Hemiplegia, unspecified affecting unspecified side: Secondary | ICD-10-CM | POA: Insufficient documentation

## 2015-02-04 DIAGNOSIS — R482 Apraxia: Secondary | ICD-10-CM

## 2015-02-04 DIAGNOSIS — IMO0002 Reserved for concepts with insufficient information to code with codable children: Secondary | ICD-10-CM

## 2015-02-04 DIAGNOSIS — G8110 Spastic hemiplegia affecting unspecified side: Secondary | ICD-10-CM | POA: Diagnosis not present

## 2015-02-04 NOTE — Patient Instructions (Signed)
[employer] peripheral nerve stimulator for shoulder pain  Botox for tightness of R arm

## 2015-02-04 NOTE — Progress Notes (Signed)
Subjective:    Patient ID: Sandy Barrett, female    DOB: 04/05/1932, 82 y.o.   MRN: [id number]  Botox injection December 2016 FCR50 FDS50 FDP50 FPL50   PT50 FCU25  right pectoralis, 125units  HPI Patient is globally aphasic Daughter is with her today. Daughter notes that there is improvement with right upper extremity mobility status post Botox injection. There does not seem to be much improvement with her pain especially in her right shoulder.  Daughter also notes episodes with the patient appears to be seeing things she asked whether this could be stroke related versus UTI related we discussed the differences between the 2 presentations.  Pain Inventory Average Pain 7 Pain Right Now 5 My pain is intermittent, sharp, stabbing and aching  In the last 24 hours, has pain interfered with the following? General activity 0 Relation with others 0 Enjoyment of life 0 What TIME of day is your pain at its worst? varies Sleep (in general) Poor  Pain is worse with: standing and unsure Pain improves with: medication Relief from Meds: 5  Mobility use a wheelchair needs help with transfers  Function I need assistance with the following:  dressing, bathing, toileting, meal prep, household duties and shopping  Neuro/Psych No problems in this area  Prior Studies Any changes since last visit?  no  Physicians involved in your care Any changes since last visit?  no   Family History  Problem Relation Age of Onset  . Asthma Mother   . Asthma Father   . Prostate cancer Father   . Stroke Brother    Social History   Social History  . Marital Status: Widowed    Spouse Name: N/A  . Number of Children: 2  . Years of Education: 14   Occupational History  . retired     Restaurant manager, fast food   Social History Main Topics  . Smoking status: Never Smoker   . Smokeless tobacco: Never Used  . Alcohol Use: No  . Drug Use: No  . Sexual Activity: No   Other Topics Concern  .  None   Social History Narrative   Widowed, lived alone prior to CVA 03/2014   SNF at [facility], then home 07/2014 with dtr   Right handed   Caffeine use- occasionally drinks tea   Past Surgical History  Procedure Laterality Date  . Abdominal hysterectomy  1976  . Total hip arthroplasty  1998     right  . Tonsillectomy    . Breast surgery      biopsy  . Radiology with anesthesia Left 03/08/2014    Procedure: RADIOLOGY WITH ANESTHESIA;  Surgeon: Rob Hickman, MD;  Location: [place];  Service: Radiology;  Laterality: Left;   Past Medical History  Diagnosis Date  . HYPERTENSION   . HYPERTHYROIDISM   . OBESITY   . ARTHRITIS   . Posttraumatic stress disorder   . INSOMNIA   . MIGRAINE HEADACHE   . Hyperthyroidism     s/p I-131 ablation 03/2011 of multinod goiter  . Arthritis   . Stroke [facility]) 03/2014    dysarthria  . Diverticulosis   . Pulmonary embolism ([place]) 03/2014    with DVT   BP 133/68 mmHg  Pulse 92  Opioid Risk Score:   Fall Risk Score:  `1  Depression screen PHQ 2/9  Depression screen [facility] 2/9 02/04/2015 11/26/2014 09/17/2014 08/21/2014 08/20/2014 08/07/2013 12/24/2011  Decreased Interest 0 0 0 0 0 0 0  Down, Depressed, Hopeless 1  1 0 0 1 1 0  PHQ - 2 Score 1 1 0 0 1 1 0     Review of Systems  All other systems reviewed and are negative.      Objective:   Physical Exam  Constitutional: She appears well-developed and well-nourished.  HENT:  Head: Normocephalic and atraumatic.  Eyes: Conjunctivae and EOM are normal. Pupils are equal, round, and reactive to light.  No evidence of left gaze preference  Cannot assess visual fields or for neglect given her global aphasia  Neurological: She is alert. She exhibits abnormal muscle tone. Gait abnormal.  Global aphasia unable to follow commands except with some  Hand over hand cues  Motor strength is 0/5 in the right deltoid, biceps, triceps, grip Trace right knee extension 0 hip flexion 0 at the  ankle  MAS of 3 at the finger flexors and thumb flexor on the right MAS 2 at the wrist flexor MAS 2 at the pectoralis  Nursing note and vitals reviewed.         Assessment & Plan:  1. Right spastic hemiplegia with improved tone at the wrist flexors, pectoralis on the right upper tremor. Expect greater improvement in the finger flexors and thumb flexor. For this reason I would recommend ultrasound guided Botox injection next visit.  Will continue with the same dosages  FCR50 FDS50 FDP50 FPL50   PT50 FCU25  right pectoralis, 125units  2. Hemiplegic shoulder pain on the right. Expect that this will be chronic. Likely multifactorial with some central sensitization. We discussed treatment alternatives including peripheral nerve stimulation. This is likely a combination of subluxation, adhesive capsulitis  3. Visual deficits related to stroke difficult to characterize given her global aphasia but I would suspect given her lesion that she may have some visual illusions related to optic radiations towards the occipital cortex that travel through the infarcted area Do not think any specific treatment would be helpful.  Over half of the 25 min visit was spent counseling and coordinating care.

## 2015-02-05 ENCOUNTER — Telehealth: Payer: Self-pay | Admitting: Internal Medicine

## 2015-02-05 DIAGNOSIS — Z87440 Personal history of urinary (tract) infections: Secondary | ICD-10-CM | POA: Diagnosis not present

## 2015-02-05 DIAGNOSIS — I69351 Hemiplegia and hemiparesis following cerebral infarction affecting right dominant side: Secondary | ICD-10-CM | POA: Diagnosis not present

## 2015-02-05 DIAGNOSIS — S71101D Unspecified open wound, right thigh, subsequent encounter: Secondary | ICD-10-CM | POA: Diagnosis not present

## 2015-02-05 DIAGNOSIS — Z86711 Personal history of pulmonary embolism: Secondary | ICD-10-CM | POA: Diagnosis not present

## 2015-02-05 DIAGNOSIS — E669 Obesity, unspecified: Secondary | ICD-10-CM | POA: Diagnosis not present

## 2015-02-05 DIAGNOSIS — I10 Essential (primary) hypertension: Secondary | ICD-10-CM | POA: Diagnosis not present

## 2015-02-05 NOTE — Telephone Encounter (Signed)
ok 

## 2015-02-05 NOTE — Telephone Encounter (Signed)
Request verbal order to continue speech therapy with patient for next 4 weeks.

## 2015-02-05 NOTE — Telephone Encounter (Signed)
Please advise 

## 2015-02-06 ENCOUNTER — Ambulatory Visit (INDEPENDENT_AMBULATORY_CARE_PROVIDER_SITE_OTHER): Payer: Medicare Other | Admitting: Internal Medicine

## 2015-02-06 ENCOUNTER — Encounter: Payer: Self-pay | Admitting: Internal Medicine

## 2015-02-06 VITALS — BP 104/66 | HR 88 | Temp 97.8°F | Resp 16 | Wt 136.0 lb

## 2015-02-06 DIAGNOSIS — IMO0002 Reserved for concepts with insufficient information to code with codable children: Secondary | ICD-10-CM

## 2015-02-06 DIAGNOSIS — H9190 Unspecified hearing loss, unspecified ear: Secondary | ICD-10-CM

## 2015-02-06 DIAGNOSIS — E669 Obesity, unspecified: Secondary | ICD-10-CM | POA: Diagnosis not present

## 2015-02-06 DIAGNOSIS — I69320 Aphasia following cerebral infarction: Secondary | ICD-10-CM

## 2015-02-06 DIAGNOSIS — Z87440 Personal history of urinary (tract) infections: Secondary | ICD-10-CM | POA: Diagnosis not present

## 2015-02-06 DIAGNOSIS — Z86711 Personal history of pulmonary embolism: Secondary | ICD-10-CM | POA: Diagnosis not present

## 2015-02-06 DIAGNOSIS — N390 Urinary tract infection, site not specified: Secondary | ICD-10-CM | POA: Insufficient documentation

## 2015-02-06 DIAGNOSIS — I69351 Hemiplegia and hemiparesis following cerebral infarction affecting right dominant side: Secondary | ICD-10-CM | POA: Diagnosis not present

## 2015-02-06 DIAGNOSIS — S71101D Unspecified open wound, right thigh, subsequent encounter: Secondary | ICD-10-CM | POA: Diagnosis not present

## 2015-02-06 DIAGNOSIS — I10 Essential (primary) hypertension: Secondary | ICD-10-CM | POA: Diagnosis not present

## 2015-02-06 NOTE — Progress Notes (Signed)
Subjective:    Patient ID: Sandy Barrett, female    DOB: 03-03-1932, 82 y.o.   MRN: [id number]  HPI She is here for an acute visit for hallucinations.  They started after her stroke, but her daughter recently realized that may have also urinary tract infections.  Since completing treatment for the recent UTI she has had them less often and has not had them in about one week.  There has been no medication changes.    Urinary tract infections: She has had at least 3 in the past year, probably more.  Her daughter is concerned about helped prevent infections. She understands that they can presents with confusion or change in behavior. Her mother is not able to communicate and she doesn't have any symptoms.  ? Postmenopausal bleeding:  She had some brown staining in her underewar a few months ago and there was concern for possible vaginal bleeding. She was referred to gynecology for further evaluation, but because they did not have a hoyer lift she was not able to be examined.  In the emergency room at 1 point she was examined and the doctor thought she may have had a rectovaginal fistula.  Her daughter has not seen any stains in her underwear and wonders if the GYN evaluation is necessary because it is so difficult for her to be evaluated.  CVA, aphasia: She has been receiving speech therapy and her daughter says it has helped and she is doing better. She is having the warfarin monitor.   Medications and allergies reviewed with patient and updated if appropriate.  Patient Active Problem List   Diagnosis Date Noted  . Decubitus ulcer of sacral region, stage 2 11/26/2014  . Primary osteoarthritis of left knee 10/29/2014  . Primary osteoarthritis involving multiple joints 07/26/2014  . Pernicious anemia 07/26/2014  . Hearing loss 07/26/2014  . Encounter for therapeutic drug monitoring 07/17/2014  . Spastic hemiparesis affecting dominant side ([place]) 05/31/2014  . DVT of lower extremity,  bilateral ([place]) 03/14/2014  . Global aphasia 03/14/2014  . 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 Dysphagia due to recent stroke 03/14/2014  . Aphasia due to stroke 03/13/2014  . Right hemiparesis ([place]) 03/13/2014  . Embolic stroke ([place])   . Cerebral infarction due to embolism of left middle cerebral artery ([place])   . Cerebral infarction due to thrombosis of right middle cerebral artery ([place])   . Stroke ([place]) 03/08/2014  . Pulmonary embolism ([place]) 03/08/2014  . Back pain 08/29/2013  . Primary localized osteoarthrosis, lower leg 03/06/2013  . IBS (irritable bowel syndrome)   . Multinodular goiter 01/13/2011  . HYPERTHYROIDISM 11/11/2009  . Insomnia 07/03/2009  . POSTTRAUMATIC STRESS DISORDER 08/22/2008  . OBESITY 04/21/2008  . Osteoarthritis 04/21/2008  . MIGRAINE HEADACHE 04/20/2008  . Essential hypertension 04/20/2008    Current Outpatient Prescriptions on File Prior to Visit  Medication Sig Dispense Refill  . acetaminophen (TYLENOL) 325 MG tablet Take 1-2 tablets (325-650 mg total) by mouth every 4 (four) hours as needed for mild pain.    [name] diclofenac sodium (VOLTAREN) 1 % GEL Apply 2 g topically 3 (three) times daily. 100 g 11  . gabapentin (NEURONTIN) 100 MG capsule TAKE 1 CAPSULE (100 MG TOTAL) BY MOUTH 3 (THREE) TIMES DAILY. 90 capsule 5  . guaiFENesin-dextromethorphan (ROBITUSSIN DM) 100-10 MG/5ML syrup Take 5 mLs by mouth every 4 (four) hours as needed for cough. 118 mL 0  . Multiple Vitamins-Minerals (DECUBI-VITE) CAPS Take 1 capsule by mouth daily. Take  1 capsule by mouth daily for wound healing    . omeprazole (PRILOSEC) 20 MG capsule TAKE 1 CAPSULE (20 MG TOTAL) BY MOUTH DAILY. ON AN EMPTY STOMACH 90 capsule 1  . Probiotic Product (ALIGN) 4 MG CAPS Take 1 capsule by mouth daily.     [name] senna-docusate (SENOKOT-S) 8.6-50 MG per tablet Take 2 tablets by mouth daily as needed for mild constipation.     . traMADol (ULTRAM) 50 MG tablet One po QID as needed for pain 120 tablet 5    . traZODone (DESYREL) 50 MG tablet TAKE 1 TABLET (50 MG TOTAL) BY MOUTH AT BEDTIME AS NEEDED FOR SLEEP. 30 tablet 5  . warfarin (COUMADIN) 4 MG tablet Take 6 mg (1 tablet) on Mon/Wed/Fri. And take 4 mg (1 1/2 tablets) on Sun/Tues/Thurs/Sat. Or as directed. (Patient taking differently: Take 4-6 mg by mouth every evening. Take 6 mg on Mon/Wed/Fri.  Take 4 mg on Sun/Tues/Thurs/Sat.) 40 tablet 2   No current facility-administered medications on file prior to visit.    Past Medical History  Diagnosis Date  . HYPERTENSION   . HYPERTHYROIDISM   . OBESITY   . ARTHRITIS   . Posttraumatic stress disorder   . INSOMNIA   . MIGRAINE HEADACHE   . Hyperthyroidism     s/p I-131 ablation 03/2011 of multinod goiter  . Arthritis   . Stroke [facility]) 03/2014    dysarthria  . Diverticulosis   . Pulmonary embolism ([place]) 03/2014    with DVT    Past Surgical History  Procedure Laterality Date  . Abdominal hysterectomy  1976  . Total hip arthroplasty  1998     right  . Tonsillectomy    . Breast surgery      biopsy  . Radiology with anesthesia Left 03/08/2014    Procedure: RADIOLOGY WITH ANESTHESIA;  Surgeon: Rob Hickman, MD;  Location: [place];  Service: Radiology;  Laterality: Left;    Social History   Social History  . Marital Status: Widowed    Spouse Name: N/A  . Number of Children: 2  . Years of Education: 14   Occupational History  . retired     Restaurant manager, fast food   Social History Main Topics  . Smoking status: Never Smoker   . Smokeless tobacco: Never Used  . Alcohol Use: No  . Drug Use: No  . Sexual Activity: No   Other Topics Concern  . Not on file   Social History Narrative   Widowed, lived alone prior to CVA 03/2014   SNF at [facility], then home 07/2014 with dtr   Right handed   Caffeine use- occasionally drinks tea    Family History  Problem Relation Age of Onset  . Asthma Mother   . Asthma Father   . Prostate cancer Father   . Stroke Brother      Review of Systems  Constitutional: Negative for appetite change.  Cardiovascular: Positive for leg swelling (intermittent).  Endocrine: Positive for polyuria.  Musculoskeletal:       Left leg and right shoulder pain  Psychiatric/Behavioral: Positive for sleep disturbance (wakes up frequently ). Negative for confusion.       Objective:   Filed Vitals:   02/06/15 1449  BP: 104/66  Pulse: 88  Temp: 97.8 F (36.6 C)  Resp: 16   Filed Weights   02/06/15 1449  Weight: 136 lb (61.689 kg)   Body mass index is 26.56 kg/(m^2).   Physical Exam Constitutional: Appears well-developed  and well-nourished. No distress.  Neck: Neck supple. No tracheal deviation present. No thyromegaly present.  No carotid bruit. No cervical adenopathy.   Cardiovascular: Normal rate, regular rhythm and normal heart sounds.   2/6 systolic murmur heard.  trace edema Pulmonary/Chest: Effort normal and breath sounds normal. No respiratory distress. No wheezes.       Assessment & Plan:   Hallucinations: Seem to have resolved Most likely were related to her urinary tract infection Monitor closely  Postmenopausal bleeding There is no confirmation that she truly was having postmenopausal bleeding. She currently is having no symptoms Concern for possible rectal-vaginal fistula. Hopefully urology will be obtained to evaluate-may be related to UTI's line will hold off on official GYN evaluation Monitor her to GYN again if needed, but we will need accommodations since she is not able to get up on the table herself   See Problem List for Assessment and Plan of chronic medical problems.

## 2015-02-06 NOTE — Telephone Encounter (Signed)
Called allison back no answer LMOM with md response.../lmb 

## 2015-02-06 NOTE — Progress Notes (Signed)
Pre visit review using our clinic review tool, if applicable. No additional management support is needed unless otherwise documented below in the visit note. 

## 2015-02-06 NOTE — Patient Instructions (Signed)
A referral was ordered for urology - they will help evaluate her frequent UTI's and possible fistula.    Monitor closely for any confusion or change in behavior which may be a sign of a UTI.  A referral was ordered for [facility] hearing for the bear test.     We will hold off on GYN evaluation given that we are not sure if she has truly had any vaginal bleeding -- just monitor for now.

## 2015-02-06 NOTE — Assessment & Plan Note (Signed)
Doing  speech therapy

## 2015-02-06 NOTE — Assessment & Plan Note (Signed)
We'll refer to urology to see if she is a candidate for preventative medication Also concern for possible rectal-vaginal fistula

## 2015-02-07 DIAGNOSIS — S71101D Unspecified open wound, right thigh, subsequent encounter: Secondary | ICD-10-CM | POA: Diagnosis not present

## 2015-02-07 DIAGNOSIS — Z87440 Personal history of urinary (tract) infections: Secondary | ICD-10-CM | POA: Diagnosis not present

## 2015-02-07 DIAGNOSIS — I10 Essential (primary) hypertension: Secondary | ICD-10-CM | POA: Diagnosis not present

## 2015-02-07 DIAGNOSIS — I69351 Hemiplegia and hemiparesis following cerebral infarction affecting right dominant side: Secondary | ICD-10-CM | POA: Diagnosis not present

## 2015-02-07 DIAGNOSIS — E669 Obesity, unspecified: Secondary | ICD-10-CM | POA: Diagnosis not present

## 2015-02-07 DIAGNOSIS — Z86711 Personal history of pulmonary embolism: Secondary | ICD-10-CM | POA: Diagnosis not present

## 2015-02-08 ENCOUNTER — Telehealth: Payer: Self-pay | Admitting: Internal Medicine

## 2015-02-08 DIAGNOSIS — I69351 Hemiplegia and hemiparesis following cerebral infarction affecting right dominant side: Secondary | ICD-10-CM | POA: Diagnosis not present

## 2015-02-08 DIAGNOSIS — S71101D Unspecified open wound, right thigh, subsequent encounter: Secondary | ICD-10-CM | POA: Diagnosis not present

## 2015-02-08 DIAGNOSIS — Z86711 Personal history of pulmonary embolism: Secondary | ICD-10-CM | POA: Diagnosis not present

## 2015-02-08 DIAGNOSIS — Z87440 Personal history of urinary (tract) infections: Secondary | ICD-10-CM | POA: Diagnosis not present

## 2015-02-08 DIAGNOSIS — I10 Essential (primary) hypertension: Secondary | ICD-10-CM | POA: Diagnosis not present

## 2015-02-08 DIAGNOSIS — E669 Obesity, unspecified: Secondary | ICD-10-CM | POA: Diagnosis not present

## 2015-02-08 NOTE — Telephone Encounter (Signed)
[facility] called requesting that we issue a social worker to this patient for future planning. She states that her daughter is becoming overwhelmed with the expense of being her caretaker.

## 2015-02-11 DIAGNOSIS — S71101D Unspecified open wound, right thigh, subsequent encounter: Secondary | ICD-10-CM | POA: Diagnosis not present

## 2015-02-11 DIAGNOSIS — E669 Obesity, unspecified: Secondary | ICD-10-CM | POA: Diagnosis not present

## 2015-02-11 DIAGNOSIS — I69351 Hemiplegia and hemiparesis following cerebral infarction affecting right dominant side: Secondary | ICD-10-CM | POA: Diagnosis not present

## 2015-02-11 DIAGNOSIS — I10 Essential (primary) hypertension: Secondary | ICD-10-CM | POA: Diagnosis not present

## 2015-02-11 DIAGNOSIS — Z86711 Personal history of pulmonary embolism: Secondary | ICD-10-CM | POA: Diagnosis not present

## 2015-02-11 DIAGNOSIS — Z87440 Personal history of urinary (tract) infections: Secondary | ICD-10-CM | POA: Diagnosis not present

## 2015-02-11 NOTE — Telephone Encounter (Signed)
Spoke with Gerald Stabs from [facility] to give verbal orders.

## 2015-02-11 NOTE — Telephone Encounter (Signed)
ok 

## 2015-02-11 NOTE — Telephone Encounter (Signed)
Please advise 

## 2015-02-12 DIAGNOSIS — S71101D Unspecified open wound, right thigh, subsequent encounter: Secondary | ICD-10-CM | POA: Diagnosis not present

## 2015-02-12 DIAGNOSIS — E669 Obesity, unspecified: Secondary | ICD-10-CM | POA: Diagnosis not present

## 2015-02-12 DIAGNOSIS — I10 Essential (primary) hypertension: Secondary | ICD-10-CM | POA: Diagnosis not present

## 2015-02-12 DIAGNOSIS — I69351 Hemiplegia and hemiparesis following cerebral infarction affecting right dominant side: Secondary | ICD-10-CM | POA: Diagnosis not present

## 2015-02-12 DIAGNOSIS — Z86711 Personal history of pulmonary embolism: Secondary | ICD-10-CM | POA: Diagnosis not present

## 2015-02-12 DIAGNOSIS — Z87440 Personal history of urinary (tract) infections: Secondary | ICD-10-CM | POA: Diagnosis not present

## 2015-02-14 DIAGNOSIS — Z86711 Personal history of pulmonary embolism: Secondary | ICD-10-CM | POA: Diagnosis not present

## 2015-02-14 DIAGNOSIS — I69351 Hemiplegia and hemiparesis following cerebral infarction affecting right dominant side: Secondary | ICD-10-CM | POA: Diagnosis not present

## 2015-02-14 DIAGNOSIS — I10 Essential (primary) hypertension: Secondary | ICD-10-CM | POA: Diagnosis not present

## 2015-02-14 DIAGNOSIS — Z87440 Personal history of urinary (tract) infections: Secondary | ICD-10-CM | POA: Diagnosis not present

## 2015-02-14 DIAGNOSIS — E669 Obesity, unspecified: Secondary | ICD-10-CM | POA: Diagnosis not present

## 2015-02-14 DIAGNOSIS — S71101D Unspecified open wound, right thigh, subsequent encounter: Secondary | ICD-10-CM | POA: Diagnosis not present

## 2015-02-15 ENCOUNTER — Telehealth: Payer: Self-pay | Admitting: General Practice

## 2015-02-15 DIAGNOSIS — Z87440 Personal history of urinary (tract) infections: Secondary | ICD-10-CM | POA: Diagnosis not present

## 2015-02-15 DIAGNOSIS — Z86711 Personal history of pulmonary embolism: Secondary | ICD-10-CM | POA: Diagnosis not present

## 2015-02-15 DIAGNOSIS — E669 Obesity, unspecified: Secondary | ICD-10-CM | POA: Diagnosis not present

## 2015-02-15 DIAGNOSIS — I10 Essential (primary) hypertension: Secondary | ICD-10-CM | POA: Diagnosis not present

## 2015-02-15 DIAGNOSIS — S71101D Unspecified open wound, right thigh, subsequent encounter: Secondary | ICD-10-CM | POA: Diagnosis not present

## 2015-02-15 DIAGNOSIS — I69351 Hemiplegia and hemiparesis following cerebral infarction affecting right dominant side: Secondary | ICD-10-CM | POA: Diagnosis not present

## 2015-02-15 NOTE — Telephone Encounter (Signed)
Orders faxed to [place] to check INR on Friday, 2/17 and call results to Villa Herb, RN @ [phone].

## 2015-02-18 ENCOUNTER — Ambulatory Visit (INDEPENDENT_AMBULATORY_CARE_PROVIDER_SITE_OTHER): Payer: Medicare Other | Admitting: Neurology

## 2015-02-18 ENCOUNTER — Encounter: Payer: Self-pay | Admitting: Neurology

## 2015-02-18 VITALS — BP 130/72 | HR 65 | Ht 60.0 in

## 2015-02-18 DIAGNOSIS — R441 Visual hallucinations: Secondary | ICD-10-CM

## 2015-02-18 NOTE — Progress Notes (Signed)
[facility] [address] [place]. [place] [address] [phone]       OFFICE FOLLOW-UP NOTE  Sandy Barrett Date of Birth:  01/05/1933 Medical Record Number:  [id number]   HPI: 37 year African-American lady who seen today for first office follow-up visit following hospital admission for stroke on 03/08/14. She is accompanied by 2 of her daughters. Patient presented with sudden onset of speech difficulties, right-sided weakness and facial droop. Her last known well was unknown when she had talked to her sister on the phone and she went for had appointment and 1 PM and had is immediately noted that she was confused and having speech difficulties. She presented to the emergency room with aphasia and right hemiparesis and right hemianopsia. Last seen normal was outside the window for TPA upon initial determination hence CT scan with perfusion was obtained which showed a large ischemic penumbra in the left MCA territory. After several telephonic discussions with family members it was determined that the last seen normal  Determined initially was incorrect inpatient was within the timeframe for tPA which was given without complications.. Patient however had widened mediastinum on chest x-ray requiring a CT scan of the chest to rule out aortic dissection and she was subsequently found to have a large thyroid mass with bilateral pulmonary emboli. IV TPA was administered. She was found to have left terminal carotid artery occlusion for which she underwent unsuccessful attempt at revascularization with mechanical embolectomy by Dr. the mesh were due to technical difficulties vessel could not be opened. She was admitted to intensive care unit and was initially intubated. She remained globally aphasic with dense right hemiplegia and visual field loss. 2-D echo showed normal ejection fraction but there was presence of interatrial septal aneurysm but bubble study and TEE were not done. She was  also found to have extensive bilateral deep and thrombosis and pulmonary emboli and hence despite the large stroke was started cautiously on IV heparin without bolus. MRI scan confirmed subsequently a moderate size left MCA infarct with nonhemorrhagic transformation. There are also tiny punctate foci of right MCA infarcts. MRA showed occluded terminal left ICA and MCA. Carotid ultrasound showed no significant extracranial stenosis but abnormal left ICA waveform suggestive of distal occlusion. Patient was found to have a large intrathoracic goiter with displacement of the trachea, aortic branches and superior vena cava with sluggish flow in the left jugular vein but no thrombus. LDL cholesterol is 53 hemoglobin A1c was 5.4. Protein C and S activity were normal and presented, and was negative. Telemetry monitoring did not reveal atrial fibrillation. Patient was transferred for rehabilitation and is currently living in a nursing home where she is on warfarin which is tolerating well without any bleeding complications. She remains aphasic with dense right hemiplegia. She is able to assist with transfers and can stand up with the one-person assist with physical therapy and gait belt walk with little. The family is getting ready to transfer the patient home over the next few weeks and have a lot of questions about her care and prognosis which  I answered. Update 02/18/2015 : She returns for follow-up after last visit in May 2016 and is a complaint by 2 of her daughters. She remains neurologically stable without recurrent stroke or TIA symptoms however the family has noticed that she often stares off into space and points towards things and they feel that she may be having visual hallucinations. She does not appear to be distress with this. These seem  to be more pronounced in the last 3 weeks following her urinary tract infection. She was initially treated with Cipro followed by a different antibiotic. Patient remains  wheelchair bound and is total care and family provides 24-hour care at home. She remains on Coumadin for her pulmonary embolism and is tolerating well without bleeding or bruising. She has not had any falls, head injury, or seizures that have been noted ROS:   14 system review of systems is positive for hearing loss, cough, incontinence of bowels, incontinence of bladder, speech difficulty, joint pain, back pain, walking difficulty, hallucinations, wounds and all other systems negative  PMH:  Past Medical History  Diagnosis Date  . HYPERTENSION   . HYPERTHYROIDISM   . OBESITY   . ARTHRITIS   . Posttraumatic stress disorder   . INSOMNIA   . MIGRAINE HEADACHE   . Hyperthyroidism     s/p I-131 ablation 03/2011 of multinod goiter  . Arthritis   . Stroke [facility]) 03/2014    dysarthria  . Diverticulosis   . Pulmonary embolism ([place]) 03/2014    with DVT    Social History:  Social History   Social History  . Marital Status: Widowed    Spouse Name: N/A  . Number of Children: 2  . Years of Education: 14   Occupational History  . retired     Restaurant manager, fast food   Social History Main Topics  . Smoking status: Never Smoker   . Smokeless tobacco: Never Used  . Alcohol Use: No  . Drug Use: No  . Sexual Activity: No   Other Topics Concern  . Not on file   Social History Narrative   Widowed, lived alone prior to CVA 03/2014   SNF at [facility], then home 07/2014 with dtr   Right handed   Caffeine use- occasionally drinks tea    Medications:   Current Outpatient Prescriptions on File Prior to Visit  Medication Sig Dispense Refill  . acetaminophen (TYLENOL) 325 MG tablet Take 1-2 tablets (325-650 mg total) by mouth every 4 (four) hours as needed for mild pain.    [name] diclofenac sodium (VOLTAREN) 1 % GEL Apply 2 g topically 3 (three) times daily. 100 g 11  . gabapentin (NEURONTIN) 100 MG capsule TAKE 1 CAPSULE (100 MG TOTAL) BY MOUTH 3 (THREE) TIMES DAILY. 90 capsule 5  .  guaiFENesin-dextromethorphan (ROBITUSSIN DM) 100-10 MG/5ML syrup Take 5 mLs by mouth every 4 (four) hours as needed for cough. 118 mL 0  . Multiple Vitamins-Minerals (DECUBI-VITE) CAPS Take 1 capsule by mouth daily. Take 1 capsule by mouth daily for wound healing    . omeprazole (PRILOSEC) 20 MG capsule TAKE 1 CAPSULE (20 MG TOTAL) BY MOUTH DAILY. ON AN EMPTY STOMACH 90 capsule 1  . Probiotic Product (ALIGN) 4 MG CAPS Take 1 capsule by mouth daily.     [name] senna-docusate (SENOKOT-S) 8.6-50 MG per tablet Take 2 tablets by mouth daily as needed for mild constipation.     . traMADol (ULTRAM) 50 MG tablet One po QID as needed for pain 120 tablet 5  . traZODone (DESYREL) 50 MG tablet TAKE 1 TABLET (50 MG TOTAL) BY MOUTH AT BEDTIME AS NEEDED FOR SLEEP. 30 tablet 5  . warfarin (COUMADIN) 4 MG tablet Take 6 mg (1 tablet) on Mon/Wed/Fri. And take 4 mg (1 1/2 tablets) on Sun/Tues/Thurs/Sat. Or as directed. (Patient taking differently: Take 4-6 mg by mouth every evening. Take 6 mg on Mon, Wed and Friday.  Take  4 mg on Sun, Tuesday , Thursday and saturday) 40 tablet 2   No current facility-administered medications on file prior to visit.    Allergies:  No Known Allergies  Physical Exam General: Obese elderly African-American lady, seated, in no evident distress Head: head normocephalic and atraumatic.  Neck: supple with no carotid or supraclavicular bruits Cardiovascular: regular rate and rhythm, no murmurs Musculoskeletal: no deformity. Right foot drop and wears foot brace and right hand brace Skin:  no rash/petichiae. Pedal edema 2+ bilatera Vascular:  Normal pulses all extremities Filed Vitals:   02/18/15 1149  BP: 130/72  Pulse: 65   Neurologic Exam Mental Status: Awake and fully alert. Globally aphasic and will follow only occasional midline commands and mimicking. Speech is fluent but nonsensical. Will not follow any commands consistently.  Cranial Nerves: Fundoscopic exam reveals sharp disc  margins. Pupils equal, briskly reactive to light. Extraocular movements full without nystagmus. Visual fields show dense right homonymous hemianopsia to threat Hearing intact. Facial sensation intact. Mild right lower facial weakness., tongue, palate moves normally and symmetrically.  Motor: Dense spastic right hemiplegia with 0/5 strength with right foot drop and hand and wrist flexion deformity. Increased tone on the right. Normal antigravity strength on the left. Sensory.: intact to touch ,pinprick .on the left but impaired on the right  Coordination: Accurate on the left and cannot be tested on the right.  Gait and Station: Not tested as patient sitting in wheelchair and as per family requires one-person assist with physical therapy  Reflexes: 2+ and asymmetric much brisker on the right side Toes downgoing.   NIHSS  20 Modified Rankin  4  ASSESSMENT: 89 year lady with large left middle cerebral artery infarct secondary to occlusion of the left internal carotid artery with concurrent bilateral lower extremity deep and thrombosis and pulmonary embolism with strong suspicion for paradoxical emboli in March 2016 treated with IV TPA and failed mechanical thrombectomy with significant residual disability. Possible visual hallucinations noted since the stroke may represent hemianopic hallucinations.    PLAN: I had a long discussion with the patient and her 2 daughters regarding her suspected visual hallucinations which likely represent hemi-and no pelvic hallucinations in her area of vision loss from her stroke. Check MRI scan of the brain, EEG and dementia panel labs were reversible causes. Continue warfarin for stroke prevention given history of pulmonary embolism and maintain strict control of hypertension with blood pressure goal below 140/90, lipids with LDL cholesterol goal below 70 mg percent. Greater than 50% time during this 25 minute visit was spent on counseling and coordination of care.  Return for follow-up in 6 months with nurse practitioner or call earlier if necessary Antony Contras, MD      Note: This document was prepared with digital dictation and possible smart phrase technology. Any transcriptional errors that result from this process are unintentional

## 2015-02-18 NOTE — Patient Instructions (Signed)
I had a long discussion with the patient and her 2 daughters regarding her suspected visual hallucinations which likely represent hemi-and no pelvic hallucinations in her area of vision loss from her stroke. Check MRI scan of the brain, EEG and dementia panel labs were reversible causes. Continue warfarin for stroke prevention given history of pulmonary embolism and maintain strict control of hypertension with blood pressure goal below 140/90, lipids with LDL cholesterol goal below 70 mg percent. Return for follow-up in 6 months with nurse practitioner or call earlier if necessary

## 2015-02-19 DIAGNOSIS — S71101D Unspecified open wound, right thigh, subsequent encounter: Secondary | ICD-10-CM | POA: Diagnosis not present

## 2015-02-19 DIAGNOSIS — E669 Obesity, unspecified: Secondary | ICD-10-CM | POA: Diagnosis not present

## 2015-02-19 DIAGNOSIS — Z87440 Personal history of urinary (tract) infections: Secondary | ICD-10-CM | POA: Diagnosis not present

## 2015-02-19 DIAGNOSIS — I10 Essential (primary) hypertension: Secondary | ICD-10-CM | POA: Diagnosis not present

## 2015-02-19 DIAGNOSIS — I69351 Hemiplegia and hemiparesis following cerebral infarction affecting right dominant side: Secondary | ICD-10-CM | POA: Diagnosis not present

## 2015-02-19 DIAGNOSIS — Z86711 Personal history of pulmonary embolism: Secondary | ICD-10-CM | POA: Diagnosis not present

## 2015-02-20 DIAGNOSIS — Z86711 Personal history of pulmonary embolism: Secondary | ICD-10-CM | POA: Diagnosis not present

## 2015-02-20 DIAGNOSIS — E669 Obesity, unspecified: Secondary | ICD-10-CM | POA: Diagnosis not present

## 2015-02-20 DIAGNOSIS — S71101D Unspecified open wound, right thigh, subsequent encounter: Secondary | ICD-10-CM | POA: Diagnosis not present

## 2015-02-20 DIAGNOSIS — I10 Essential (primary) hypertension: Secondary | ICD-10-CM | POA: Diagnosis not present

## 2015-02-20 DIAGNOSIS — Z87440 Personal history of urinary (tract) infections: Secondary | ICD-10-CM | POA: Diagnosis not present

## 2015-02-20 DIAGNOSIS — I69351 Hemiplegia and hemiparesis following cerebral infarction affecting right dominant side: Secondary | ICD-10-CM | POA: Diagnosis not present

## 2015-02-22 DIAGNOSIS — E669 Obesity, unspecified: Secondary | ICD-10-CM | POA: Diagnosis not present

## 2015-02-22 DIAGNOSIS — Z87440 Personal history of urinary (tract) infections: Secondary | ICD-10-CM | POA: Diagnosis not present

## 2015-02-22 DIAGNOSIS — I69351 Hemiplegia and hemiparesis following cerebral infarction affecting right dominant side: Secondary | ICD-10-CM | POA: Diagnosis not present

## 2015-02-22 DIAGNOSIS — I10 Essential (primary) hypertension: Secondary | ICD-10-CM | POA: Diagnosis not present

## 2015-02-22 DIAGNOSIS — S71101D Unspecified open wound, right thigh, subsequent encounter: Secondary | ICD-10-CM | POA: Diagnosis not present

## 2015-02-22 DIAGNOSIS — Z86711 Personal history of pulmonary embolism: Secondary | ICD-10-CM | POA: Diagnosis not present

## 2015-02-25 DIAGNOSIS — I69351 Hemiplegia and hemiparesis following cerebral infarction affecting right dominant side: Secondary | ICD-10-CM | POA: Diagnosis not present

## 2015-02-25 DIAGNOSIS — E669 Obesity, unspecified: Secondary | ICD-10-CM | POA: Diagnosis not present

## 2015-02-25 DIAGNOSIS — S71101D Unspecified open wound, right thigh, subsequent encounter: Secondary | ICD-10-CM | POA: Diagnosis not present

## 2015-02-25 DIAGNOSIS — Z87440 Personal history of urinary (tract) infections: Secondary | ICD-10-CM | POA: Diagnosis not present

## 2015-02-25 DIAGNOSIS — Z86711 Personal history of pulmonary embolism: Secondary | ICD-10-CM | POA: Diagnosis not present

## 2015-02-25 DIAGNOSIS — I10 Essential (primary) hypertension: Secondary | ICD-10-CM | POA: Diagnosis not present

## 2015-02-26 ENCOUNTER — Ambulatory Visit: Payer: Medicare Other | Admitting: Internal Medicine

## 2015-02-27 ENCOUNTER — Telehealth: Payer: Self-pay | Admitting: Internal Medicine

## 2015-02-27 ENCOUNTER — Telehealth: Payer: Self-pay | Admitting: Emergency Medicine

## 2015-02-27 DIAGNOSIS — I69351 Hemiplegia and hemiparesis following cerebral infarction affecting right dominant side: Secondary | ICD-10-CM | POA: Diagnosis not present

## 2015-02-27 DIAGNOSIS — S71101D Unspecified open wound, right thigh, subsequent encounter: Secondary | ICD-10-CM | POA: Diagnosis not present

## 2015-02-27 DIAGNOSIS — Z87440 Personal history of urinary (tract) infections: Secondary | ICD-10-CM | POA: Diagnosis not present

## 2015-02-27 DIAGNOSIS — E669 Obesity, unspecified: Secondary | ICD-10-CM | POA: Diagnosis not present

## 2015-02-27 DIAGNOSIS — I10 Essential (primary) hypertension: Secondary | ICD-10-CM | POA: Diagnosis not present

## 2015-02-27 DIAGNOSIS — Z86711 Personal history of pulmonary embolism: Secondary | ICD-10-CM | POA: Diagnosis not present

## 2015-02-27 NOTE — Telephone Encounter (Signed)
Does patient need a PT/INR drawn this month?

## 2015-02-27 NOTE — Telephone Encounter (Signed)
PT from [facility] called requesting verbal orders to give pt neuro muscular stimulation on her R quad. Please advise.

## 2015-02-27 NOTE — Telephone Encounter (Signed)
Spoke with Sandy Barrett to inform. Pt weill be getting discharged from [facility] within the next month.

## 2015-02-27 NOTE — Telephone Encounter (Signed)
States that [facility] has not received orders for E Stemp medical treatment on right leg.  Is requesting a call back in regards.

## 2015-02-27 NOTE — Telephone Encounter (Signed)
Katherine from [facility] called stating pt is requesting an arm support. Pt believes the insurance will pay, Belenda Cruise not so much.  She wanted to let you know that she will be faxing an order for it probably tomorrow morning. If Dr. Quay Burow signs pts daughter will come and get it.

## 2015-02-27 NOTE — Telephone Encounter (Signed)
Yes she needs  It every month

## 2015-02-27 NOTE — Telephone Encounter (Signed)
Please advise 

## 2015-02-27 NOTE — Telephone Encounter (Signed)
ok 

## 2015-02-28 DIAGNOSIS — N390 Urinary tract infection, site not specified: Secondary | ICD-10-CM | POA: Diagnosis not present

## 2015-02-28 DIAGNOSIS — B961 Klebsiella pneumoniae [K. pneumoniae] as the cause of diseases classified elsewhere: Secondary | ICD-10-CM | POA: Diagnosis not present

## 2015-02-28 DIAGNOSIS — Z0000 Encounter for general adult medical examination without abnormal findings: Secondary | ICD-10-CM | POA: Diagnosis not present

## 2015-02-28 DIAGNOSIS — Z87440 Personal history of urinary (tract) infections: Secondary | ICD-10-CM | POA: Diagnosis not present

## 2015-02-28 NOTE — Telephone Encounter (Signed)
LVM with PT for [facility] informing of verbal orders

## 2015-02-28 NOTE — Telephone Encounter (Signed)
Spoke with daughter to inform.  

## 2015-03-01 ENCOUNTER — Telehealth: Payer: Self-pay | Admitting: Emergency Medicine

## 2015-03-01 ENCOUNTER — Telehealth: Payer: Self-pay | Admitting: Internal Medicine

## 2015-03-01 DIAGNOSIS — E669 Obesity, unspecified: Secondary | ICD-10-CM | POA: Diagnosis not present

## 2015-03-01 DIAGNOSIS — I69351 Hemiplegia and hemiparesis following cerebral infarction affecting right dominant side: Secondary | ICD-10-CM | POA: Diagnosis not present

## 2015-03-01 DIAGNOSIS — N939 Abnormal uterine and vaginal bleeding, unspecified: Secondary | ICD-10-CM

## 2015-03-01 DIAGNOSIS — Z87440 Personal history of urinary (tract) infections: Secondary | ICD-10-CM | POA: Diagnosis not present

## 2015-03-01 DIAGNOSIS — Z86711 Personal history of pulmonary embolism: Secondary | ICD-10-CM | POA: Diagnosis not present

## 2015-03-01 DIAGNOSIS — I10 Essential (primary) hypertension: Secondary | ICD-10-CM | POA: Diagnosis not present

## 2015-03-01 DIAGNOSIS — S71101D Unspecified open wound, right thigh, subsequent encounter: Secondary | ICD-10-CM | POA: Diagnosis not present

## 2015-03-01 NOTE — Telephone Encounter (Signed)
Please advise 

## 2015-03-01 NOTE — Telephone Encounter (Signed)
Daughter called in and would like a referral to:  [place] [phone]

## 2015-03-01 NOTE — Telephone Encounter (Signed)
Vania Rea from [facility] called to inform that pts INR was 2.1 and PT was 25.2. Please advise if needs any changes.

## 2015-03-03 NOTE — Telephone Encounter (Signed)
Ordered

## 2015-03-03 NOTE — Telephone Encounter (Signed)
No change, repeat in one month

## 2015-03-04 DIAGNOSIS — I10 Essential (primary) hypertension: Secondary | ICD-10-CM | POA: Diagnosis not present

## 2015-03-04 DIAGNOSIS — E669 Obesity, unspecified: Secondary | ICD-10-CM | POA: Diagnosis not present

## 2015-03-04 DIAGNOSIS — Z87440 Personal history of urinary (tract) infections: Secondary | ICD-10-CM | POA: Diagnosis not present

## 2015-03-04 DIAGNOSIS — Z86711 Personal history of pulmonary embolism: Secondary | ICD-10-CM | POA: Diagnosis not present

## 2015-03-04 DIAGNOSIS — S71101D Unspecified open wound, right thigh, subsequent encounter: Secondary | ICD-10-CM | POA: Diagnosis not present

## 2015-03-04 DIAGNOSIS — I69351 Hemiplegia and hemiparesis following cerebral infarction affecting right dominant side: Secondary | ICD-10-CM | POA: Diagnosis not present

## 2015-03-04 NOTE — Telephone Encounter (Signed)
LVM for daughter to call back.  

## 2015-03-05 ENCOUNTER — Telehealth: Payer: Self-pay | Admitting: Emergency Medicine

## 2015-03-05 DIAGNOSIS — Z87440 Personal history of urinary (tract) infections: Secondary | ICD-10-CM | POA: Diagnosis not present

## 2015-03-05 DIAGNOSIS — E669 Obesity, unspecified: Secondary | ICD-10-CM | POA: Diagnosis not present

## 2015-03-05 DIAGNOSIS — Z86711 Personal history of pulmonary embolism: Secondary | ICD-10-CM | POA: Diagnosis not present

## 2015-03-05 DIAGNOSIS — I69351 Hemiplegia and hemiparesis following cerebral infarction affecting right dominant side: Secondary | ICD-10-CM | POA: Diagnosis not present

## 2015-03-05 DIAGNOSIS — S71101D Unspecified open wound, right thigh, subsequent encounter: Secondary | ICD-10-CM | POA: Diagnosis not present

## 2015-03-05 DIAGNOSIS — I10 Essential (primary) hypertension: Secondary | ICD-10-CM | POA: Diagnosis not present

## 2015-03-05 NOTE — Telephone Encounter (Signed)
Spoke with daughter to inform.  

## 2015-03-05 NOTE — Telephone Encounter (Signed)
When speaking to daughter this morning, she stated that she has noticed a hard area in left lower abdominal area, it is getting larger, is tender to touch, and only shows when pt is lying down. Is this something pt should come to office for [place] to evaluate or should she be taken to the ED. Please advise

## 2015-03-05 NOTE — Telephone Encounter (Signed)
She should come in - may be a hernia.  If she is have mod-severe pain may need to be seen in ED or if getting larger quickly

## 2015-03-05 NOTE — Telephone Encounter (Signed)
LVM informing Pts caretakers

## 2015-03-06 DIAGNOSIS — I10 Essential (primary) hypertension: Secondary | ICD-10-CM | POA: Diagnosis not present

## 2015-03-06 DIAGNOSIS — S71101D Unspecified open wound, right thigh, subsequent encounter: Secondary | ICD-10-CM | POA: Diagnosis not present

## 2015-03-06 DIAGNOSIS — Z87440 Personal history of urinary (tract) infections: Secondary | ICD-10-CM | POA: Diagnosis not present

## 2015-03-06 DIAGNOSIS — E669 Obesity, unspecified: Secondary | ICD-10-CM | POA: Diagnosis not present

## 2015-03-06 DIAGNOSIS — I69351 Hemiplegia and hemiparesis following cerebral infarction affecting right dominant side: Secondary | ICD-10-CM | POA: Diagnosis not present

## 2015-03-06 DIAGNOSIS — Z86711 Personal history of pulmonary embolism: Secondary | ICD-10-CM | POA: Diagnosis not present

## 2015-03-06 NOTE — Telephone Encounter (Signed)
ok 

## 2015-03-06 NOTE — Telephone Encounter (Signed)
Spoke with Belenda Cruise from [facility], she is going to refax the paperwork for the arm support. She is also requesting that pts OT be extended for 1 week for 2 more visits. Please advise.

## 2015-03-06 NOTE — Telephone Encounter (Signed)
[facility] has been informed.

## 2015-03-07 DIAGNOSIS — I10 Essential (primary) hypertension: Secondary | ICD-10-CM | POA: Diagnosis not present

## 2015-03-07 DIAGNOSIS — S71101D Unspecified open wound, right thigh, subsequent encounter: Secondary | ICD-10-CM | POA: Diagnosis not present

## 2015-03-07 DIAGNOSIS — Z87440 Personal history of urinary (tract) infections: Secondary | ICD-10-CM | POA: Diagnosis not present

## 2015-03-07 DIAGNOSIS — Z86711 Personal history of pulmonary embolism: Secondary | ICD-10-CM | POA: Diagnosis not present

## 2015-03-07 DIAGNOSIS — I69351 Hemiplegia and hemiparesis following cerebral infarction affecting right dominant side: Secondary | ICD-10-CM | POA: Diagnosis not present

## 2015-03-07 DIAGNOSIS — E669 Obesity, unspecified: Secondary | ICD-10-CM | POA: Diagnosis not present

## 2015-03-08 ENCOUNTER — Encounter: Payer: Self-pay | Admitting: Internal Medicine

## 2015-03-08 ENCOUNTER — Ambulatory Visit (INDEPENDENT_AMBULATORY_CARE_PROVIDER_SITE_OTHER): Payer: Medicare Other | Admitting: Internal Medicine

## 2015-03-08 VITALS — BP 96/62 | HR 85 | Temp 98.0°F | Resp 16

## 2015-03-08 DIAGNOSIS — B373 Candidiasis of vulva and vagina: Secondary | ICD-10-CM

## 2015-03-08 DIAGNOSIS — E669 Obesity, unspecified: Secondary | ICD-10-CM | POA: Diagnosis not present

## 2015-03-08 DIAGNOSIS — R234 Changes in skin texture: Secondary | ICD-10-CM

## 2015-03-08 DIAGNOSIS — Z87440 Personal history of urinary (tract) infections: Secondary | ICD-10-CM | POA: Diagnosis not present

## 2015-03-08 DIAGNOSIS — R1900 Intra-abdominal and pelvic swelling, mass and lump, unspecified site: Secondary | ICD-10-CM | POA: Diagnosis not present

## 2015-03-08 DIAGNOSIS — I10 Essential (primary) hypertension: Secondary | ICD-10-CM | POA: Diagnosis not present

## 2015-03-08 DIAGNOSIS — S71101D Unspecified open wound, right thigh, subsequent encounter: Secondary | ICD-10-CM | POA: Diagnosis not present

## 2015-03-08 DIAGNOSIS — Z86711 Personal history of pulmonary embolism: Secondary | ICD-10-CM | POA: Diagnosis not present

## 2015-03-08 DIAGNOSIS — B3731 Acute candidiasis of vulva and vagina: Secondary | ICD-10-CM

## 2015-03-08 DIAGNOSIS — I69351 Hemiplegia and hemiparesis following cerebral infarction affecting right dominant side: Secondary | ICD-10-CM | POA: Diagnosis not present

## 2015-03-08 DIAGNOSIS — R222 Localized swelling, mass and lump, trunk: Secondary | ICD-10-CM

## 2015-03-08 MED ORDER — FLUCONAZOLE 150 MG PO TABS
150.0000 mg | ORAL_TABLET | Freq: Once | ORAL | Status: DC
Start: 2015-03-08 — End: 2015-04-18

## 2015-03-08 NOTE — Patient Instructions (Signed)
We will monitor the abdominal lump for now. We can consider imaging in the next future if she has any pain or if it increases.   You will hear from [place] regarding a referral to a breast specialist.  Diflucan was sent to the pharmacy for the possible yeast infection.

## 2015-03-08 NOTE — Assessment & Plan Note (Signed)
Her symptoms are consistent with a possible yeast infection If will be difficult to get her into a gyn and do a pelvic exam so I think it is better to empirically treat with diflucan 150 mg x 1 first and see if the symptoms resolve in which she will not need to see anyone else - if they do not she will need to see gyn

## 2015-03-08 NOTE — Assessment & Plan Note (Signed)
Concern for cancer Will refer to surgery for further evaluation Last mammogram was in the fall and skin changes were seen at that time - also seen on Ct scan  - they have progressed and have been noticeable to her family in the past two weeks

## 2015-03-08 NOTE — Progress Notes (Signed)
Subjective:    Patient ID: Sandy Barrett, female    DOB: 04/13/1932, 82 y.o.   MRN: [id number]  HPI She is here for an acute visit. She is here with her daughter who provides all information due to the patient's severe dysarthria.    Abdominal lump:  It is on her left side.  One day it was larger and then smaller the following day after a bowel movement.  It does not appear to be painful and patients denies pain.  There is no overlying skin discoloration.  She has had a hysterectomy.  Her daughter only feels the lump when she is laying down.  Her bowel movements are normal.  She had a Ct scan of her abdomen in December and there was an abdominal wall hernia.  No concerning findings in her abdomen or pelvis.   Breast - Her left breast has always been smaller.  Over the past two weeks.  The difference is more noticeable between her two breasts-- right breast is getting bigger.    Yesterday her daughter noticed white discharge at her vaginal opening.  It is intermittent.  She does rock a little in her chair - has done that with in the past with itching or when she needs to have bm.     Medications and allergies reviewed with patient and updated if appropriate.  Patient Active Problem List   Diagnosis Date Noted  . Hallucination, visual 02/18/2015  . Frequent UTI 02/06/2015  . Decubitus ulcer of sacral region, stage 2 11/26/2014  . Primary osteoarthritis involving multiple joints 07/26/2014  . Pernicious anemia 07/26/2014  . Hearing loss 07/26/2014  . Encounter for therapeutic drug monitoring 07/17/2014  . Spastic hemiparesis affecting dominant side ([place]) 05/31/2014  . DVT of lower extremity, bilateral ([place]) 03/14/2014  . Global aphasia 03/14/2014  . 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 Dysphagia due to recent stroke 03/14/2014  . Aphasia due to stroke 03/13/2014  . Right hemiparesis ([place]) 03/13/2014  . Cerebral infarction due to embolism of left middle cerebral artery ([place])   .  Stroke, embolic ([place]) 123456  . Pulmonary embolism ([place]) 03/08/2014  . Back pain 08/29/2013  . Primary localized osteoarthrosis, lower leg 03/06/2013  . IBS (irritable bowel syndrome)   . Multinodular goiter 01/13/2011  . HYPERTHYROIDISM 11/11/2009  . Insomnia 07/03/2009  . POSTTRAUMATIC STRESS DISORDER 08/22/2008  . OBESITY 04/21/2008  . Osteoarthritis 04/21/2008  . MIGRAINE HEADACHE 04/20/2008  . Essential hypertension 04/20/2008    Current Outpatient Prescriptions on File Prior to Visit  Medication Sig Dispense Refill  . acetaminophen (TYLENOL) 325 MG tablet Take 1-2 tablets (325-650 mg total) by mouth every 4 (four) hours as needed for mild pain.    [name] diclofenac sodium (VOLTAREN) 1 % GEL Apply 2 g topically 3 (three) times daily. 100 g 11  . gabapentin (NEURONTIN) 100 MG capsule TAKE 1 CAPSULE (100 MG TOTAL) BY MOUTH 3 (THREE) TIMES DAILY. 90 capsule 5  . guaiFENesin-dextromethorphan (ROBITUSSIN DM) 100-10 MG/5ML syrup Take 5 mLs by mouth every 4 (four) hours as needed for cough. 118 mL 0  . Multiple Vitamins-Minerals (DECUBI-VITE) CAPS Take 1 capsule by mouth daily. Take 1 capsule by mouth daily for wound healing    . omeprazole (PRILOSEC) 20 MG capsule TAKE 1 CAPSULE (20 MG TOTAL) BY MOUTH DAILY. ON AN EMPTY STOMACH 90 capsule 1  . Probiotic Product (ALIGN) 4 MG CAPS Take 1 capsule by mouth daily.     [name] senna-docusate (SENOKOT-S) 8.6-50  MG per tablet Take 2 tablets by mouth daily as needed for mild constipation.     . traMADol (ULTRAM) 50 MG tablet One po QID as needed for pain 120 tablet 5  . traZODone (DESYREL) 50 MG tablet TAKE 1 TABLET (50 MG TOTAL) BY MOUTH AT BEDTIME AS NEEDED FOR SLEEP. 30 tablet 5  . warfarin (COUMADIN) 4 MG tablet Take 6 mg (1 tablet) on Mon/Wed/Fri. And take 4 mg (1 1/2 tablets) on Sun/Tues/Thurs/Sat. Or as directed. (Patient taking differently: Take 4-6 mg by mouth every evening. Take 6 mg on Mon, Wed and Friday.  Take 4 mg on Sun, Tuesday , Thursday  and saturday) 40 tablet 2   No current facility-administered medications on file prior to visit.    Past Medical History  Diagnosis Date  . HYPERTENSION   . HYPERTHYROIDISM   . OBESITY   . ARTHRITIS   . Posttraumatic stress disorder   . INSOMNIA   . MIGRAINE HEADACHE   . Hyperthyroidism     s/p I-131 ablation 03/2011 of multinod goiter  . Arthritis   . Stroke [facility]) 03/2014    dysarthria  . Diverticulosis   . Pulmonary embolism ([place]) 03/2014    with DVT    Past Surgical History  Procedure Laterality Date  . Abdominal hysterectomy  1976  . Total hip arthroplasty  1998     right  . Tonsillectomy    . Breast surgery      biopsy  . Radiology with anesthesia Left 03/08/2014    Procedure: RADIOLOGY WITH ANESTHESIA;  Surgeon: Rob Hickman, MD;  Location: [facility];  Service: Radiology;  Laterality: Left;    Social History   Social History  . Marital Status: Widowed    Spouse Name: N/A  . Number of Children: 2  . Years of Education: 14   Occupational History  . retired     Restaurant manager, fast food   Social History Main Topics  . Smoking status: Never Smoker   . Smokeless tobacco: Never Used  . Alcohol Use: No  . Drug Use: No  . Sexual Activity: No   Other Topics Concern  . None   Social History Narrative   Widowed, lived alone prior to CVA 03/2014   SNF at [facility], then home 07/2014 with dtr   Right handed   Caffeine use- occasionally drinks tea    Family History  Problem Relation Age of Onset  . Asthma Mother   . Asthma Father   . Prostate cancer Father   . Stroke Brother     Review of Systems See HPI - history provided by daughter, patient is unable to communicate     Objective:   Filed Vitals:   03/08/15 1120  BP: 96/62  Pulse: 85  Temp: 98 F (36.7 C)  Resp: 16   Filed Weights   There is no weight on file to calculate BMI.   Physical Exam  Constitutional: She appears well-developed and well-nourished. No distress.    Pulmonary/Chest:  Left breast appears normal.  Right breast with swelling, heaviness, tenderness, skin with orange peel appearance, no nipple discharge, no discrete lumps  Abdominal: Soft. She exhibits mass (LLQ mass that is nontender, possible hernia). There is no tenderness.  Musculoskeletal: She exhibits edema.  Neurological: She is alert.  Severe dysarthria, unable to communicate clearly  Skin: She is not diaphoretic.  Psychiatric: She has a normal mood and affect. Her behavior is normal.       Assessment &  Plan:    Since her stroke she is not able to move her right arm and would greatly benefit from a wheelchair right arm rest that would move. This would increase her arm and shoulder comfort.   The orthopedic said only better support will improve her right shoulder problems.    She has had improvement with E-stim therapy in the right arm.  Will continue.     See Problem List for Assessment and Plan of chronic medical problems.

## 2015-03-08 NOTE — Progress Notes (Signed)
Pre visit review using our clinic review tool, if applicable. No additional management support is needed unless otherwise documented below in the visit note. 

## 2015-03-09 DIAGNOSIS — R222 Localized swelling, mass and lump, trunk: Secondary | ICD-10-CM | POA: Insufficient documentation

## 2015-03-09 NOTE — Assessment & Plan Note (Signed)
In LLQ, nontender Possible hernia Ct scan from December reviewed  - no concerning findings Given that it is not causing any pain we will hold off on further evaluation at this time - may need imaging to evaluated further  - [place] of Ct scan

## 2015-03-12 ENCOUNTER — Ambulatory Visit (INDEPENDENT_AMBULATORY_CARE_PROVIDER_SITE_OTHER): Payer: Medicare Other | Admitting: Neurology

## 2015-03-12 ENCOUNTER — Other Ambulatory Visit (INDEPENDENT_AMBULATORY_CARE_PROVIDER_SITE_OTHER): Payer: Self-pay

## 2015-03-12 DIAGNOSIS — Z0289 Encounter for other administrative examinations: Secondary | ICD-10-CM

## 2015-03-12 DIAGNOSIS — R4182 Altered mental status, unspecified: Secondary | ICD-10-CM

## 2015-03-12 DIAGNOSIS — R441 Visual hallucinations: Secondary | ICD-10-CM

## 2015-03-12 DIAGNOSIS — Z79899 Other long term (current) drug therapy: Secondary | ICD-10-CM | POA: Diagnosis not present

## 2015-03-12 NOTE — Procedures (Signed)
     History: Sandy Barrett is an 82 year old patient with a history of a stroke event associated with a carotid occlusion, right-sided weakness and facial droop, and speech difficulties. The stroke occurred on 03/08/2014. The patient is now having episodes of staring off, and possible visual hallucinations. The patient is being evaluated for possible seizures.  This is a routine EEG. No skull defects are noted. Medications include gabapentin, multivitamins, Prilosec, Senokot, Ultram, trazodone, and Coumadin.  EEG classification: Dysrhythmia grade 1 generalized  Description of the recording: The background rhythms of this recording consists of a moderately well modulated medium amplitude rhythm of 7-8 Hz that is reactive to eye opening closure. As the record progresses, the patient appears to remain in the waking state during the recording. There appears to be an asymmetry of muscle artifact, most prominent on the left rather than the right. There is a significant degree of head movement and muscle artifact early in the recording. Photic stimulation and hyperventilation were not performed. At no time during the recording does there appear to be evidence of spike or spike-wave discharges or evidence of focal slowing. EKG monitor shows no evidence of cardiac rhythm abnormalities with a heart rate of 78.  Impression: This is an abnormal EEG recording secondary to mild background slowing. This is a nonspecific recording, and can be seen with any process that results in a mild toxic or metabolic encephalopathy or any dementing type illness. No epileptiform discharges were seen. There is an asymmetry of muscle activity, decreased on the right, consistent with a hemiparesis.

## 2015-03-14 ENCOUNTER — Encounter: Payer: Self-pay | Admitting: Obstetrics & Gynecology

## 2015-03-15 ENCOUNTER — Telehealth: Payer: Self-pay | Admitting: Emergency Medicine

## 2015-03-15 ENCOUNTER — Other Ambulatory Visit: Payer: Self-pay | Admitting: Neurology

## 2015-03-15 LAB — DEMENTIA PANEL
Homocysteine: 14.6 umol/L (ref 0.0–15.0)
RPR Ser Ql: NONREACTIVE
TSH: 0.205 u[IU]/mL — ABNORMAL LOW (ref 0.450–4.500)
Vitamin B-12: 444 pg/mL (ref 211–946)

## 2015-03-15 NOTE — Telephone Encounter (Signed)
Spoke with daughter to inform that the referral was in process and she would hear something once it was completed.

## 2015-03-18 ENCOUNTER — Encounter: Payer: Medicare Other | Attending: Physical Medicine & Rehabilitation

## 2015-03-18 ENCOUNTER — Telehealth: Payer: Self-pay

## 2015-03-18 ENCOUNTER — Encounter: Payer: Self-pay | Admitting: Physical Medicine & Rehabilitation

## 2015-03-18 ENCOUNTER — Ambulatory Visit (HOSPITAL_BASED_OUTPATIENT_CLINIC_OR_DEPARTMENT_OTHER): Payer: Medicare Other | Admitting: Physical Medicine & Rehabilitation

## 2015-03-18 VITALS — BP 102/57 | HR 84

## 2015-03-18 DIAGNOSIS — G8111 Spastic hemiplegia affecting right dominant side: Secondary | ICD-10-CM | POA: Diagnosis not present

## 2015-03-18 DIAGNOSIS — M7541 Impingement syndrome of right shoulder: Secondary | ICD-10-CM | POA: Insufficient documentation

## 2015-03-18 DIAGNOSIS — M6289 Other specified disorders of muscle: Secondary | ICD-10-CM | POA: Diagnosis not present

## 2015-03-18 DIAGNOSIS — G8190 Hemiplegia, unspecified affecting unspecified side: Secondary | ICD-10-CM | POA: Diagnosis not present

## 2015-03-18 DIAGNOSIS — I69320 Aphasia following cerebral infarction: Secondary | ICD-10-CM | POA: Insufficient documentation

## 2015-03-18 DIAGNOSIS — G8110 Spastic hemiplegia affecting unspecified side: Secondary | ICD-10-CM

## 2015-03-18 NOTE — Patient Instructions (Signed)

## 2015-03-18 NOTE — Progress Notes (Signed)
.  Botox Injection for spasticity using needle EMG guidance and Ultrasound Guidance Right Upper ext Botox Injection for spasticity using needle EMG guidance  Dilution: 50 Units/ml Indication: Severe spasticity which interferes with ADL,mobility and/or  hygiene and is unresponsive to medication management and other conservative care Informed consent was obtained after describing risks and benefits of the procedure with the patient. This includes bleeding, bruising, infection, excessive weakness, or medication side effects. A REMS form is on file and signed.Used ultrasound guidance Linear transducer sterile gelFor the FDP and FCR muscles Needle: 25g 2" needle electrode Number of units per muscle  FCR50 FDS50 FDP50 FPL50   PT50 FCU25  right pectoralis, 125units  All injections were done after obtaining appropriate EMG activity and after negative drawback for blood. The patient tolerated the procedure well. Post procedure instructions were given. A followup appointment was made.

## 2015-03-18 NOTE — Telephone Encounter (Signed)
Receive incoming call from patients daughter [name] about her moms lab work. Rn explain per Dr.Sethi her thyroid hormone is overactive and to see PCP.to adjust medicine or prescribed. Rn stated pts B12 and syphilis tests are normal. Pts daughter verbalized understanding. Copy will be left at front desk for patients daughter.

## 2015-03-18 NOTE — Telephone Encounter (Signed)
LFt vm for patients daughter [name] or [name] to call back about moms lab work. Lab work will be fax to patients PCP.

## 2015-03-18 NOTE — Telephone Encounter (Signed)
-----   Message from Garvin Fila, MD sent at 03/14/2015  5:40 PM EST ----- Kindly inform patient that thyroid hormone is overactive and to see primary MD to adjust dose of thyroid medicine.Vitamin B 12 and syphilis tests were normal

## 2015-03-19 ENCOUNTER — Other Ambulatory Visit: Payer: Medicare Other

## 2015-03-19 ENCOUNTER — Telehealth: Payer: Self-pay | Admitting: Internal Medicine

## 2015-03-19 DIAGNOSIS — H2513 Age-related nuclear cataract, bilateral: Secondary | ICD-10-CM | POA: Diagnosis not present

## 2015-03-19 DIAGNOSIS — E0590 Thyrotoxicosis, unspecified without thyrotoxic crisis or storm: Secondary | ICD-10-CM

## 2015-03-19 NOTE — Telephone Encounter (Signed)
Call patient's daughter - she had recent blood work by neuro -- tsh is low.  She has a history of hyperthyroidism and was seen by Dr. Loanne Drilling in the past.  She was treated a few years ago, but he thought she was high risk of recurrence.  She needs to see him again.  Referral ordered.

## 2015-03-20 ENCOUNTER — Ambulatory Visit
Admission: RE | Admit: 2015-03-20 | Discharge: 2015-03-20 | Disposition: A | Discharge status: Home / Self Care | Payer: Medicare Other | Source: Ambulatory Visit | Attending: Neurology | Admitting: Neurology

## 2015-03-20 DIAGNOSIS — R441 Visual hallucinations: Secondary | ICD-10-CM | POA: Diagnosis not present

## 2015-03-20 NOTE — Telephone Encounter (Signed)
Spoke with daughter to inform.  

## 2015-03-20 NOTE — Telephone Encounter (Signed)
LVM for pts daughter to call back.  

## 2015-03-24 ENCOUNTER — Other Ambulatory Visit: Payer: Self-pay | Admitting: Internal Medicine

## 2015-03-25 ENCOUNTER — Other Ambulatory Visit: Payer: Self-pay | Admitting: General Practice

## 2015-03-25 MED ORDER — WARFARIN SODIUM 4 MG PO TABS: ORAL_TABLET | ORAL | Status: DC

## 2015-03-28 ENCOUNTER — Encounter: Payer: Self-pay | Admitting: Internal Medicine

## 2015-03-29 ENCOUNTER — Ambulatory Visit (INDEPENDENT_AMBULATORY_CARE_PROVIDER_SITE_OTHER): Payer: Medicare Other | Admitting: General Practice

## 2015-03-29 DIAGNOSIS — Z5181 Encounter for therapeutic drug level monitoring: Secondary | ICD-10-CM

## 2015-03-29 LAB — POCT INR: INR: 2.2

## 2015-03-29 NOTE — Progress Notes (Signed)
Pre visit review using our clinic review tool, if applicable. No additional management support is needed unless otherwise documented below in the visit note. 

## 2015-03-29 NOTE — Progress Notes (Signed)
I have reviewed and agree with the plan. 

## 2015-04-01 ENCOUNTER — Telehealth: Payer: Self-pay | Admitting: Emergency Medicine

## 2015-04-01 DIAGNOSIS — G8110 Spastic hemiplegia affecting unspecified side: Secondary | ICD-10-CM

## 2015-04-01 DIAGNOSIS — G8191 Hemiplegia, unspecified affecting right dominant side: Secondary | ICD-10-CM

## 2015-04-01 DIAGNOSIS — IMO0002 Reserved for concepts with insufficient information to code with codable children: Secondary | ICD-10-CM

## 2015-04-01 NOTE — Telephone Encounter (Signed)
Pts daughter called in and would like for use to send in another referral for PT. She would like to use someone else other than [facility]. [facility] was recommended. Please Advise.

## 2015-04-01 NOTE — Telephone Encounter (Signed)
ordered

## 2015-04-02 NOTE — Telephone Encounter (Signed)
Spoke with pts daughter to inform.  

## 2015-04-05 ENCOUNTER — Telehealth: Payer: Self-pay | Admitting: Endocrinology

## 2015-04-05 ENCOUNTER — Ambulatory Visit (INDEPENDENT_AMBULATORY_CARE_PROVIDER_SITE_OTHER): Payer: Medicare Other | Admitting: Endocrinology

## 2015-04-05 ENCOUNTER — Encounter: Payer: Self-pay | Admitting: Endocrinology

## 2015-04-05 VITALS — BP 122/80 | HR 91 | Temp 98.1°F

## 2015-04-05 DIAGNOSIS — E042 Nontoxic multinodular goiter: Secondary | ICD-10-CM | POA: Diagnosis not present

## 2015-04-05 LAB — TSH: TSH: 0.79 u[IU]/mL (ref 0.35–4.50)

## 2015-04-05 LAB — T4, FREE: Free T4: 1.1 ng/dL (ref 0.60–1.60)

## 2015-04-05 MED ORDER — METHIMAZOLE 5 MG PO TABS: 5.0000 mg | ORAL_TABLET | ORAL | Status: DC

## 2015-04-05 NOTE — Telephone Encounter (Signed)
I contacted Villa Herb, Coumadin Nurse at [facility] and advised on methimazole start. She voiced understanding and stated she would contact the pt next week to schedule her a coumadin check.

## 2015-04-05 NOTE — Progress Notes (Signed)
Subjective:    Patient ID: Sandy Barrett, female    DOB: 1932-09-07, 82 y.o.   MRN: [id number]  HPI In 2013, pt had I-131 rx for hyperthyroidism, due to a multinodular goiter.  She has never had bx.  She had a CVA in 2016.  She has slight intermittent palpitations in the chest, but no assoc sob.  Past Medical History  Diagnosis Date  . HYPERTENSION   . HYPERTHYROIDISM   . OBESITY   . ARTHRITIS   . Posttraumatic stress disorder   . INSOMNIA   . MIGRAINE HEADACHE   . Hyperthyroidism     s/p I-131 ablation 03/2011 of multinod goiter  . Arthritis   . Stroke [facility]) 03/2014    dysarthria  . Diverticulosis   . Pulmonary embolism ([place]) 03/2014    with DVT    Past Surgical History  Procedure Laterality Date  . Abdominal hysterectomy  1976  . Total hip arthroplasty  1998     right  . Tonsillectomy    . Breast surgery      biopsy  . Radiology with anesthesia Left 03/08/2014    Procedure: RADIOLOGY WITH ANESTHESIA;  Surgeon: Rob Hickman, MD;  Location: [place];  Service: Radiology;  Laterality: Left;    Social History   Social History  . Marital Status: Widowed    Spouse Name: N/A  . Number of Children: 2  . Years of Education: 14   Occupational History  . retired     Restaurant manager, fast food   Social History Main Topics  . Smoking status: Never Smoker   . Smokeless tobacco: Never Used  . Alcohol Use: No  . Drug Use: No  . Sexual Activity: No   Other Topics Concern  . Not on file   Social History Narrative   Widowed, lived alone prior to CVA 03/2014   SNF at [facility], then home 07/2014 with dtr   Right handed   Caffeine use- occasionally drinks tea    Current Outpatient Prescriptions on File Prior to Visit  Medication Sig Dispense Refill  . acetaminophen (TYLENOL) 325 MG tablet Take 1-2 tablets (325-650 mg total) by mouth every 4 (four) hours as needed for mild pain.    [name] diclofenac sodium (VOLTAREN) 1 % GEL Apply 2 g topically 3 (three) times daily. 100  g 11  . fluconazole (DIFLUCAN) 150 MG tablet Take 1 tablet (150 mg total) by mouth once. 1 tablet 0  . gabapentin (NEURONTIN) 100 MG capsule TAKE 1 CAPSULE (100 MG TOTAL) BY MOUTH 3 (THREE) TIMES DAILY. 90 capsule 5  . guaiFENesin-dextromethorphan (ROBITUSSIN DM) 100-10 MG/5ML syrup Take 5 mLs by mouth every 4 (four) hours as needed for cough. 118 mL 0  . Multiple Vitamins-Minerals (DECUBI-VITE) CAPS Take 1 capsule by mouth daily. Take 1 capsule by mouth daily for wound healing    . nitrofurantoin, macrocrystal-monohydrate, (MACROBID) 100 MG capsule Take 1 capsule by mouth daily.    [name] omeprazole (PRILOSEC) 20 MG capsule TAKE 1 CAPSULE (20 MG TOTAL) BY MOUTH DAILY. ON AN EMPTY STOMACH 90 capsule 1  . Probiotic Product (ALIGN) 4 MG CAPS Take 1 capsule by mouth daily.     [name] senna-docusate (SENOKOT-S) 8.6-50 MG per tablet Take 2 tablets by mouth daily as needed for mild constipation.     . traMADol (ULTRAM) 50 MG tablet One po QID as needed for pain 120 tablet 5  . traZODone (DESYREL) 50 MG tablet TAKE 1 TABLET (50 MG TOTAL) BY  MOUTH AT BEDTIME AS NEEDED FOR SLEEP. 30 tablet 5  . warfarin (COUMADIN) 4 MG tablet Take 6 mg (1 tablet) on Mon/Wed/Fri. And take 4 mg (1 1/2 tablets) on Sun/Tues/Thurs/Sat. Or as directed. 40 tablet 0   No current facility-administered medications on file prior to visit.    No Known Allergies  Family History  Problem Relation Age of Onset  . Asthma Mother   . Asthma Father   . Prostate cancer Father   . Stroke Brother   . Thyroid disease Neg Hx     BP 122/80 mmHg  Pulse 91  Temp(Src) 98.1 F (36.7 C) (Oral)  Ht   Wt   SpO2 93%  Review of Systems denies weight loss, neck pain, visual loss, diarrhea, polyuria, muscle weakness, excessive diaphoresis, tremor, anxiety, heat intolerance, and rhinorrhea. She has intermittent headache, cough, easy bruising, and leg edema. She has weakness of the RUE and RLE    Objective:   Physical Exam VS: see vs page GEN: no  distress.  In wheelchair HEAD: head: no deformity eyes: no periorbital swelling; slight bilat proptosis external nose and ears are normal mouth: no lesion seen NECK: I think I can only feel the top of the goiter.  CHEST WALL: no deformity LUNGS: clear to auscultation, except for a few rales at the bases.  BREASTS:  No gynecomastia.  CV: reg rate and rhythm.   Soft systolic murmur.  ABD: abdomen is soft, nontender.  no hepatosplenomegaly.  not distended.  no hernia MUSCULOSKELETAL: muscle bulk and strength are grossly normal (except absent on the RUE and RLE).  no obvious joint swelling.  gait is normal and steady.  EXTEMITIES: no deformity.  no edema PULSES: dorsalis pedis intact bilat.  no carotid bruit NEURO:  cn 2-12 grossly intact, except speech is unintelligible.   readily moves all 4's.  sensation is intact to touch on the feet SKIN:  Normal texture and temperature.  No rash or suspicious lesion is visible.   NODES:  None palpable at the neck.  PSYCH:  Does not appear anxious nor depressed.    Lab Results  Component Value Date   TSH 0.205* 03/12/2015   T4TOTAL 7.6 08/21/2014    Thyroid US (2016): Bilateral thyroid nodules. new nodule identified in the lower pole of the left lobe (5.5 cm).  I have reviewed outside records, and summarized:  Pt was noted to have suppressed TSH, and referred back here.      Assessment & Plan:  Multinodular goiter: usually hereditary.  Malignancy is unlikely, due to hyperthyroidism.  Due to advanced age and h/o cva, she is not a surgical candidate. Hyperthyroidism, recurrent after I-131 rx Frail elderly state: she can't be isolated for another dose of I-131.   Patient is advised the following: Patient Instructions  i have sent a prescription to your pharmacy, to slow the thyroid. if ever you have fever while taking methimazole, stop it and call us, even if the reason is obvious, because of the risk of a rare side-effect. Please come back  for a follow-up appointment in 1 month.     Hyperthyroidism Hyperthyroidism is when the thyroid is too active (overactive). Your thyroid is a large gland that is located in your neck. The thyroid helps to control how your body uses food (metabolism). When your thyroid is overactive, it produces too much of a hormone called thyroxine.  CAUSES Causes of hyperthyroidism may include:  Graves disease. This is when your immune system attacks the thyroid  gland. This is the most common cause.  Inflammation of the thyroid gland.  Tumor in the thyroid gland or somewhere else.  Excessive use of thyroid medicines, including:  Prescription thyroid supplement.  Herbal supplements that mimic thyroid hormones.  Solid or fluid-filled lumps within your thyroid gland (thyroid nodules).  Excessive ingestion of iodine. RISK FACTORS  Being female.  Having a family history of thyroid conditions. SIGNS AND SYMPTOMS Signs and symptoms of hyperthyroidism may include:  Nervousness.  Inability to tolerate heat.  Unexplained weight loss.  Diarrhea.  Change in the texture of hair or skin.  Heart skipping beats or making extra beats.  Rapid heart rate.  Loss of menstruation.  Shaky hands.  Fatigue.  Restlessness.  Increased appetite.  Sleep problems.  Enlarged thyroid gland or nodules. DIAGNOSIS  Diagnosis of hyperthyroidism may include:  Medical history and physical exam.  Blood tests.  Ultrasound tests. TREATMENT Treatment may include:  Medicines to control your thyroid.  Surgery to remove your thyroid.  Radiation therapy. HOME CARE INSTRUCTIONS   Take medicines only as directed by your health care provider.  Do not use any tobacco products, including cigarettes, chewing tobacco, or electronic cigarettes. If you need help quitting, ask your health care provider.  Do not exercise or do physical activity until your health care provider approves.  Keep all follow-up  appointments as directed by your health care provider. This is important. SEEK MEDICAL CARE IF:  Your symptoms do not get better with treatment.  You have fever.  You are taking thyroid replacement medicine and you:  Have depression.  Feel mentally and physically slow.  Have weight gain. SEEK IMMEDIATE MEDICAL CARE IF:   You have decreased alertness or a change in your awareness.  You have abdominal pain.  You feel dizzy.  You have a rapid heartbeat.  You have an irregular heartbeat.   This information is not intended to replace advice given to you by your health care provider. Make sure you discuss any questions you have with your health care provider.   Document Released: 12/22/2004 Document Revised: 01/12/2014 Document Reviewed: 05/09/2013 Elsevier Interactive Patient Education [employer].

## 2015-04-05 NOTE — Telephone Encounter (Signed)
please call coumadin clinic at [facility]: We are starting methimazole, so she'll need f/u soon

## 2015-04-05 NOTE — Patient Instructions (Addendum)
i have sent a prescription to your pharmacy, to slow the thyroid. if ever you have fever while taking methimazole, stop it and call us, even if the reason is obvious, because of the risk of a rare side-effect. Please come back for a follow-up appointment in 1 month.     Hyperthyroidism Hyperthyroidism is when the thyroid is too active (overactive). Your thyroid is a large gland that is located in your neck. The thyroid helps to control how your body uses food (metabolism). When your thyroid is overactive, it produces too much of a hormone called thyroxine.  CAUSES Causes of hyperthyroidism may include:  Graves disease. This is when your immune system attacks the thyroid gland. This is the most common cause.  Inflammation of the thyroid gland.  Tumor in the thyroid gland or somewhere else.  Excessive use of thyroid medicines, including:  Prescription thyroid supplement.  Herbal supplements that mimic thyroid hormones.  Solid or fluid-filled lumps within your thyroid gland (thyroid nodules).  Excessive ingestion of iodine. RISK FACTORS  Being female.  Having a family history of thyroid conditions. SIGNS AND SYMPTOMS Signs and symptoms of hyperthyroidism may include:  Nervousness.  Inability to tolerate heat.  Unexplained weight loss.  Diarrhea.  Change in the texture of hair or skin.  Heart skipping beats or making extra beats.  Rapid heart rate.  Loss of menstruation.  Shaky hands.  Fatigue.  Restlessness.  Increased appetite.  Sleep problems.  Enlarged thyroid gland or nodules. DIAGNOSIS  Diagnosis of hyperthyroidism may include:  Medical history and physical exam.  Blood tests.  Ultrasound tests. TREATMENT Treatment may include:  Medicines to control your thyroid.  Surgery to remove your thyroid.  Radiation therapy. HOME CARE INSTRUCTIONS   Take medicines only as directed by your health care provider.  Do not use any tobacco products,  including cigarettes, chewing tobacco, or electronic cigarettes. If you need help quitting, ask your health care provider.  Do not exercise or do physical activity until your health care provider approves.  Keep all follow-up appointments as directed by your health care provider. This is important. SEEK MEDICAL CARE IF:  Your symptoms do not get better with treatment.  You have fever.  You are taking thyroid replacement medicine and you:  Have depression.  Feel mentally and physically slow.  Have weight gain. SEEK IMMEDIATE MEDICAL CARE IF:   You have decreased alertness or a change in your awareness.  You have abdominal pain.  You feel dizzy.  You have a rapid heartbeat.  You have an irregular heartbeat.   This information is not intended to replace advice given to you by your health care provider. Make sure you discuss any questions you have with your health care provider.   Document Released: 12/22/2004 Document Revised: 01/12/2014 Document Reviewed: 05/09/2013 Elsevier Interactive Patient Education [employer].

## 2015-04-08 ENCOUNTER — Telehealth: Payer: Self-pay

## 2015-04-08 DIAGNOSIS — H9190 Unspecified hearing loss, unspecified ear: Secondary | ICD-10-CM

## 2015-04-08 NOTE — Telephone Encounter (Signed)
Pt's daughter called and wanted to verify. She stated the pt has not been on any thyroid medication leading up to her appointment and since her labs were normal should she begin taking the med now? Please advise, Thanks!

## 2015-04-08 NOTE — Telephone Encounter (Signed)
Referral entered as urgent.   See note below for specifics of referral if needed.

## 2015-04-08 NOTE — Telephone Encounter (Signed)
Thyroid was slightly overactive Please start taking methimazole, as i rx'ed. i'll see you next time.

## 2015-04-08 NOTE — Telephone Encounter (Signed)
-----   Message from [name], MD sent at 04/01/2015  8:22 PM EDT ----- Regarding: RE: Need an order to do a [place] with me.  Thanks.  ----- Message -----    From: Aviva Signs, CMA    Sent: 04/01/2015  11:26 AM      To: [name], MD Subject: FW: Need an order to do a BAER                 Good morning,   Dr. Asa Lente forwarded a note to me. I have you as the new PCP for pt. With your approval I can put in the order for you as requested below from audiology.  ----- Message -----    From: Rowe Clack, MD    Sent: 03/30/2015   9:39 AM      To: Aviva Signs, CMA Subject: FW: Need an order to do a BAER                 Please address below request as needed thanks ----- Message -----    From: Fredonia Highland, AUD    Sent: 03/25/2015   4:08 PM      To: Kathleene Hazel, AUD, Rowe Clack, MD Subject: Need an order to do a BAER                     Dear Dr. Asa Lente,  [facility] has requested that [facility] audiology complete threshold BAER because they were unable to obtain valid thresholds. Burgess Amor, Audiologist at [facility] [phone]) is able to complete this request within the next few weeks but we need a physician order to complete it per [facility] protocol.  Once completed, Sherri would send the results to [facility] and hopefully this would help with a hearing aid fitting.  We don't dispense hearing aids at [facility] so working with Ria Comment is wonderful.    The referral was sent to [facility] outpatient rehab initially which is why I am contacting you for the referral, but we are booked out several weeks over here and Albertine Patricia could see her sooner.  If you put the order in through [facility] would you please let us know when you reply to this message so it can be looked for-one was overlooked a few weeks ago and this would allow [place] to check our process.   Thank you for your help. Deborah L. Heide Spark, Au.D., CCC-A Doctor of  Audiology 03/25/2015

## 2015-04-09 NOTE — Telephone Encounter (Signed)
I contacted the pt's daughter and left a vm advising of note below. Requested a call back if the pt would like discuss.

## 2015-04-10 DIAGNOSIS — K439 Ventral hernia without obstruction or gangrene: Secondary | ICD-10-CM | POA: Diagnosis not present

## 2015-04-10 DIAGNOSIS — I808 Phlebitis and thrombophlebitis of other sites: Secondary | ICD-10-CM | POA: Diagnosis not present

## 2015-04-12 ENCOUNTER — Telehealth: Payer: Self-pay | Admitting: Emergency Medicine

## 2015-04-12 ENCOUNTER — Ambulatory Visit: Payer: Medicare Other

## 2015-04-12 DIAGNOSIS — M1710 Unilateral primary osteoarthritis, unspecified knee: Secondary | ICD-10-CM

## 2015-04-12 DIAGNOSIS — M5489 Other dorsalgia: Secondary | ICD-10-CM

## 2015-04-12 DIAGNOSIS — M8949 Other hypertrophic osteoarthropathy, multiple sites: Secondary | ICD-10-CM

## 2015-04-12 DIAGNOSIS — M150 Primary generalized (osteo)arthritis: Secondary | ICD-10-CM

## 2015-04-12 DIAGNOSIS — M159 Polyosteoarthritis, unspecified: Secondary | ICD-10-CM

## 2015-04-12 MED ORDER — TRAMADOL HCL 50 MG PO TABS: 50.0000 mg | ORAL_TABLET | Freq: Three times a day (TID) | ORAL | Status: DC | PRN

## 2015-04-12 MED ORDER — PROPRANOLOL HCL 10 MG PO TABS: 10.0000 mg | ORAL_TABLET | Freq: Two times a day (BID) | ORAL | Status: DC

## 2015-04-12 NOTE — Telephone Encounter (Signed)
We can try 1-2 tabs three times a day as needed.  rx change in med list - not sent anywhere/printed

## 2015-04-12 NOTE — Telephone Encounter (Signed)
Spoke to daughter to inform. Daughter stated that pt was taking 20 mg 2x a day of Inderal before stoke and was discharged from [facility] with 10mg  once daily. Is not currently taking. Her daughter believes that by physical actions like holding head and covering eyes from light that pt is having migraines. Her BP is currently running 100-120s/70-80.   She also stated that pts right arm is red and somewhat raw from being contracted all the time. Is it okay to put powder in the crease of her arm to allow it to not become raw?

## 2015-04-12 NOTE — Telephone Encounter (Signed)
Inderal sent to [facility] for 10 mg bid. If BP is ok we can increase it if needed  Ok to use powder on arm

## 2015-04-12 NOTE — Telephone Encounter (Signed)
Pts daughter called in to advise if we could increase pts dosage of Tramadol and decrease the frequency. She also asked if pt could start taking Inderal again. She was given this in the facility but it is not on her current med list. Please advise.

## 2015-04-15 DIAGNOSIS — G8110 Spastic hemiplegia affecting unspecified side: Secondary | ICD-10-CM | POA: Diagnosis not present

## 2015-04-15 DIAGNOSIS — G8191 Hemiplegia, unspecified affecting right dominant side: Secondary | ICD-10-CM | POA: Diagnosis not present

## 2015-04-15 DIAGNOSIS — M6289 Other specified disorders of muscle: Secondary | ICD-10-CM | POA: Diagnosis not present

## 2015-04-15 DIAGNOSIS — R482 Apraxia: Secondary | ICD-10-CM | POA: Diagnosis not present

## 2015-04-16 ENCOUNTER — Telehealth: Payer: Self-pay | Admitting: Emergency Medicine

## 2015-04-16 ENCOUNTER — Other Ambulatory Visit: Payer: Self-pay | Admitting: Surgery

## 2015-04-16 DIAGNOSIS — M159 Polyosteoarthritis, unspecified: Secondary | ICD-10-CM

## 2015-04-16 DIAGNOSIS — M1710 Unilateral primary osteoarthritis, unspecified knee: Secondary | ICD-10-CM

## 2015-04-16 DIAGNOSIS — M150 Primary generalized (osteo)arthritis: Secondary | ICD-10-CM

## 2015-04-16 DIAGNOSIS — I808 Phlebitis and thrombophlebitis of other sites: Secondary | ICD-10-CM

## 2015-04-16 DIAGNOSIS — M5489 Other dorsalgia: Secondary | ICD-10-CM

## 2015-04-16 DIAGNOSIS — M8949 Other hypertrophic osteoarthropathy, multiple sites: Secondary | ICD-10-CM

## 2015-04-16 MED ORDER — TRAMADOL HCL 50 MG PO TABS: 50.0000 mg | ORAL_TABLET | Freq: Three times a day (TID) | ORAL | Status: DC | PRN

## 2015-04-16 NOTE — Telephone Encounter (Signed)
RX for Tramadol faxed to pharm.  

## 2015-04-18 ENCOUNTER — Encounter: Payer: Self-pay | Admitting: Obstetrics & Gynecology

## 2015-04-18 ENCOUNTER — Ambulatory Visit (INDEPENDENT_AMBULATORY_CARE_PROVIDER_SITE_OTHER): Payer: Medicare Other | Admitting: Obstetrics & Gynecology

## 2015-04-18 DIAGNOSIS — B354 Tinea corporis: Secondary | ICD-10-CM | POA: Diagnosis present

## 2015-04-18 MED ORDER — FLUCONAZOLE 150 MG PO TABS: 150.0000 mg | ORAL_TABLET | Freq: Once | ORAL | Status: DC

## 2015-04-18 MED ORDER — TERCONAZOLE 0.4 % VA CREA: 1.0000 | TOPICAL_CREAM | Freq: Every day | VAGINAL | Status: DC

## 2015-04-18 NOTE — Patient Instructions (Signed)

## 2015-04-18 NOTE — Addendum Note (Signed)
Addended by: Michel Harrow on: 04/18/2015 05:22 PM   Modules accepted: Orders, Medications

## 2015-04-18 NOTE — Progress Notes (Signed)
Patient ID: Sandy Barrett, female   DOB: 04-15-1932, 82 y.o.   MRN: [id number] History:  82 y.o. No obstetric history on file. here today for eval of recurrent vulvar yeast infections and recurrent UTI's.  Pt s/p CVA 1 year prev in Mar 2016.  She had decubitus ulcers which has been healed for the past 4 months. She was prev having recurrent UTI's but, her last one was 2 months prev.  She was also having recurrent vulvar yeast infections the last of which was 2 months prev.  She currently has no issues but, she wanted to establish care and find out ways to prevent the above sx  The following portions of the patient's history were reviewed and updated as appropriate: allergies, current medications, past family history, past medical history, past social history, past surgical history and problem list.  Review of Systems:  Pertinent items are noted in HPI.  Objective:  Physical Exam There were no vitals taken for this visit. Gen: NAD GYN: exam deferred as pt has no active lesions today   Labs and Imaging Mr Brain Wo Contrast  03/21/2015  [facility] NEUROIMAGING REPORT STUDY DATE: 03/20/15 PATIENT NAME: Sandy Barrett DOB: 08/08/1932 MRN: [id number] ORDERING CLINICIAN: Antony Contras, MD CLINICAL HISTORY: 82 year old female with stroke and visual hallucinations. EXAM: MRI brain (without) TECHNIQUE: MRI of the brain without contrast was obtained utilizing 5 mm axial slices with T1, T2, T2 flair, SWI and diffusion weighted views.  T1 sagittal and T2 coronal views were obtained. CONTRAST: no IMAGING SITE: [facility] [address] (1.5 Tesla MRI)  FINDINGS: Large chronic left MCA ischemic infarction with encephalomalacia and gliosis affecting the left frontal, parietal and temporal regions. Dystrophic calcification vs hemosiderin deposition in the left basal ganglia. Wallerian degeneration and atrophy of the left cerebral peduncle. Within the area of chronic infarction,  there are some small areas of slightly hyperintense DWI, slightly hypointense on ADC, in the left basal ganglia which may reflect acute-subacute infarctions vs apparent signal change due to relative contrast difference compared to adjacent tissues. The remaining cortical sulci, fissures and cisterns are normal in size and appearance. Lateral, third and fourth ventricle are normal in size and appearance, except left lateral ventricle slightly enlarged on ex vacuo basis. Scattered periventricular and subcortical chronic small vessel ischemic disease. No extra-axial fluid collections are seen. No evidence of mass effect or midline shift.  On sagittal views the posterior fossa, pituitary gland and corpus callosum are unremarkable. The orbits and their contents, paranasal sinuses and calvarium are unremarkable.  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 Decreased arborization of left MCA noted.   03/21/2015  Abnormal MRI brain (without) demonstrating: 1. Few foci of left basal ganglia acute-subacute infarctions vs artifact due to relative signal difference compared to adjacent tissues. 2. Large chronic left MCA ischemic infarction with encephalomalacia and gliosis affecting the left frontal, parietal and temporal regions. Dystrophic calcification vs hemosiderin deposition in the left basal ganglia. Wallerian degeneration and atrophy of the left cerebral peduncle. 3. Occluded left ICA with decreased branching within the left MCA. 4. Compared to MRI on 03/09/14, there has been expected evolutional change of left MCA infarction. In addition, there may be a few areas of acute-subacute infarcts in the left basal ganglia vs artifact. INTERPRETING PHYSICIAN: Penni Bombard, MD Certified in Neurology, Neurophysiology and Neuroimaging [facility] [address] [place], [place] [address] [phone]  Assessment & Plan:   Recurrent yeast vulvovaginitis and UTI- both clear for >2 months.  The daughter has been doing a good job of controlling her sx.  Keep vulva clean and dry May add Poise pads inside the 'briefs' and change q 2-3 hours as needed to keep pt clean and dry Terazole cream bid prn vulvar erythema and satellite lesions- daught instructed NOT to use this daily but, only when an actual yeast infxn begins.  F/u prn ANY breakdown of the skin Diflucan 150mg  po x 1 if the Terazole cream does not seem to work  For prevention of UTI's keep pt well hydrated Keep Macrobid as prev prescribed.  F/u in 1 year or sooner prn  55 min spent in face to face conversation with pt and pts daughter, who is her primary caretaker, discussion pts clinical condition and ways to prevent above dx  Adrean Findlay L. Harraway-Smith, M.D., Cherlynn June

## 2015-04-22 DIAGNOSIS — R482 Apraxia: Secondary | ICD-10-CM | POA: Diagnosis not present

## 2015-04-22 DIAGNOSIS — G8191 Hemiplegia, unspecified affecting right dominant side: Secondary | ICD-10-CM | POA: Diagnosis not present

## 2015-04-22 DIAGNOSIS — M6289 Other specified disorders of muscle: Secondary | ICD-10-CM | POA: Diagnosis not present

## 2015-04-22 DIAGNOSIS — G8110 Spastic hemiplegia affecting unspecified side: Secondary | ICD-10-CM | POA: Diagnosis not present

## 2015-04-24 ENCOUNTER — Ambulatory Visit (HOSPITAL_COMMUNITY)
Admission: RE | Admit: 2015-04-24 | Discharge: 2015-04-24 | Disposition: A | Discharge status: Home / Self Care | Payer: Medicare Other | Source: Ambulatory Visit | Attending: Internal Medicine | Admitting: Internal Medicine

## 2015-04-24 DIAGNOSIS — M6289 Other specified disorders of muscle: Secondary | ICD-10-CM | POA: Diagnosis not present

## 2015-04-24 DIAGNOSIS — Z8673 Personal history of transient ischemic attack (TIA), and cerebral infarction without residual deficits: Secondary | ICD-10-CM | POA: Insufficient documentation

## 2015-04-24 DIAGNOSIS — H905 Unspecified sensorineural hearing loss: Secondary | ICD-10-CM | POA: Insufficient documentation

## 2015-04-24 DIAGNOSIS — G8110 Spastic hemiplegia affecting unspecified side: Secondary | ICD-10-CM | POA: Diagnosis not present

## 2015-04-24 DIAGNOSIS — R482 Apraxia: Secondary | ICD-10-CM | POA: Diagnosis not present

## 2015-04-24 DIAGNOSIS — G8191 Hemiplegia, unspecified affecting right dominant side: Secondary | ICD-10-CM | POA: Diagnosis not present

## 2015-04-24 NOTE — Procedures (Signed)
Name:  Sandy Barrett DOB:    04-10-1932 MRN:    [id number]  HISTORY: Sandy Barrett was referred from [facility] due to her decreased hearing following unsuccessful attempts to obtain audiometric test results, since Sandy Barrett had difficulty following instructions due to her stroke.  Electrophysiological (BAER) testing was recommended to estimate Sandy Barrett' hearing to see if there was a need for hearing aids. Testing was scheduled for today at [facility] and Sandy Barrett is accompanied by her daughters ,[name] and [place].  RESULTS:  Brainstem Auditory Evoked Response (BAER): Testing was performed using [id number] and 37.7clicks/sec. presented to each ear separately through insert earphones. Testing was performed while Sandy Barrett was resting on a bed; sometimes drifting into a light sleep.  Waveform morphology was poor at 85dB nHL in each ear; however wave V could be identified using both rarefaction and condensation polarities.  Tone burst testing could not be performed due to poor waveform morphology and artifact from patient's movement.  Auditory Steady-State Evoked Response (ASSR): Testing was performed at 80dB HL twice, No response was observed either time. Since communication with Sandy Barrett is not consistent with the severity of the hearing loss indicated by ASSR; behavior testing was attempted.   Behavioral Audiogram: Stroke patients can be very difficult to assess behaviorally so alternate assessment techniques were used.  With the assistance of her daughter [name], Sandy Barrett was conditioned and responded to Frequency Modulated (FM) tones in the 500-4000Hz  range through headphones.  Unmasked bone conduction testing was also performed.  Conditioning was first performed using 110dB HL FM tones with Camilla holding the headphone in front of her mother.  Once we were sure Sandy Barrett understood we wanted her to respond when she heard the sound, the headphones  were placed on Sandy Barrett. When FM tones were presented, Sandy Barrett would respond in several ways (looking up while raising her eyebrows, nodding her head, verbalizing a phrase, making a fist and raising her hand slightly). Although her verbalization could not be understood, she repeated the same phase each time. Sometimes she would respond in more than one way for a tone. Response types varied but were very consistent with the presentation of tones.  Toward the end of testing, Sandy Barrett began to shake her head when she did not hear sounds.   Audiometric Results in dB HL:                             Test reliability: good   500 Hz 1000 Hz  2000  Hz 4000 Hz  Right ear Air conduction 55dB 45dB 65dB 60dB  Right ear Bone conduction 50dB 45dB 55dB 55dB  Left ear Air conduction 50dB 50dB 50dB 60dB   Pain: None   IMPRESSION:  Today's audiometric results are consistent with bilateral sensorineural hearing loss in the moderate to moderately severe range. Electrophysiological test results were inconclusive. Neurological issues from Sandy Barrett' stoke cannot be ruled out as a contributing factor.   FAMILY EDUCATION:  The test results and recommendations were explained to the family.   A copy of today's results were given to the family to take to [facility].  The report to be sent when completed.  RECOMMENDATIONS:  Follow up with [facility]: Follow up to include: 1. Word recognition assessment with pictures at levels above today's audiometric threshold results 2. Hearing aid evaluation  If you have  any questions please feel free to contact me at [phone].  Maniya Donovan A. Rosana Hoes, Au.D., CCC-A Doctor of Audiology 04/24/2015  4:03 PM  cc:  [name], MD        Donnella Sham       [facility]

## 2015-04-25 ENCOUNTER — Ambulatory Visit
Admission: RE | Admit: 2015-04-25 | Discharge: 2015-04-25 | Disposition: A | Discharge status: Home / Self Care | Payer: Medicare Other | Source: Ambulatory Visit | Attending: Surgery | Admitting: Surgery

## 2015-04-25 DIAGNOSIS — I808 Phlebitis and thrombophlebitis of other sites: Secondary | ICD-10-CM

## 2015-04-25 DIAGNOSIS — R928 Other abnormal and inconclusive findings on diagnostic imaging of breast: Secondary | ICD-10-CM | POA: Diagnosis not present

## 2015-04-26 ENCOUNTER — Ambulatory Visit (INDEPENDENT_AMBULATORY_CARE_PROVIDER_SITE_OTHER): Payer: Medicare Other | Admitting: General Practice

## 2015-04-26 ENCOUNTER — Telehealth: Payer: Self-pay | Admitting: Internal Medicine

## 2015-04-26 ENCOUNTER — Ambulatory Visit: Payer: Medicare Other

## 2015-04-26 DIAGNOSIS — I639 Cerebral infarction, unspecified: Secondary | ICD-10-CM

## 2015-04-26 DIAGNOSIS — G8110 Spastic hemiplegia affecting unspecified side: Secondary | ICD-10-CM

## 2015-04-26 LAB — POCT INR: INR: 2

## 2015-04-26 NOTE — Progress Notes (Signed)
I have reviewed and agree with the plan. 

## 2015-04-26 NOTE — Telephone Encounter (Signed)
Daughter checking on written script for arm rest.  States needs to be written and picked up in order for medicare to cover.  Also states that the e stem machine script needs to be send to [place] [facility].  Please follow up with daughter in regards.

## 2015-04-26 NOTE — Progress Notes (Signed)
Pre visit review using our clinic review tool, if applicable. No additional management support is needed unless otherwise documented below in the visit note. 

## 2015-04-26 NOTE — Progress Notes (Signed)
Quick Note:  Mammogram result noted. Will arrange for punch biopsy in office in near future.  tmg  Earnstine Regal, MD, [facility] Office: [phone]   ______

## 2015-04-28 ENCOUNTER — Other Ambulatory Visit: Payer: Self-pay | Admitting: Internal Medicine

## 2015-04-29 ENCOUNTER — Encounter: Payer: Medicare Other | Attending: Physical Medicine & Rehabilitation

## 2015-04-29 ENCOUNTER — Encounter: Payer: Self-pay | Admitting: Physical Medicine & Rehabilitation

## 2015-04-29 ENCOUNTER — Ambulatory Visit (HOSPITAL_BASED_OUTPATIENT_CLINIC_OR_DEPARTMENT_OTHER): Payer: Medicare Other | Admitting: Physical Medicine & Rehabilitation

## 2015-04-29 ENCOUNTER — Other Ambulatory Visit: Payer: Self-pay | Admitting: General Practice

## 2015-04-29 VITALS — BP 134/79 | HR 80 | Resp 16

## 2015-04-29 DIAGNOSIS — M7502 Adhesive capsulitis of left shoulder: Secondary | ICD-10-CM

## 2015-04-29 DIAGNOSIS — I69320 Aphasia following cerebral infarction: Secondary | ICD-10-CM | POA: Diagnosis not present

## 2015-04-29 DIAGNOSIS — M6289 Other specified disorders of muscle: Secondary | ICD-10-CM | POA: Diagnosis not present

## 2015-04-29 DIAGNOSIS — M7541 Impingement syndrome of right shoulder: Secondary | ICD-10-CM | POA: Diagnosis not present

## 2015-04-29 DIAGNOSIS — G8110 Spastic hemiplegia affecting unspecified side: Secondary | ICD-10-CM | POA: Diagnosis not present

## 2015-04-29 DIAGNOSIS — G8190 Hemiplegia, unspecified affecting unspecified side: Secondary | ICD-10-CM | POA: Insufficient documentation

## 2015-04-29 DIAGNOSIS — IMO0002 Reserved for concepts with insufficient information to code with codable children: Secondary | ICD-10-CM

## 2015-04-29 MED ORDER — WARFARIN SODIUM 4 MG PO TABS: ORAL_TABLET | ORAL | Status: DC

## 2015-04-29 NOTE — Progress Notes (Signed)
Subjective:    Patient ID: Sandy Barrett, female    DOB: 09-14-1932, 82 y.o.   MRN: [id number] Informed consent was obtained after describing risks and benefits of the procedure with the patient. This includes bleeding, bruising, infection, excessive weakness, or medication side effects. A REMS form is on file and signed.Used ultrasound guidance Linear transducer sterile gelFor the FDP and FCR muscles Needle: 25g 2" needle electrode Number of units per muscle  FCR50 FDS50 FDP50 FPL50   PT50 FCU25  right pectoralis, 125units HPI  Pain Inventory Average Pain 5 Pain Right Now 6 My pain is intermittent  In the last 24 hours, has pain interfered with the following? General activity 5 Relation with others 5 Enjoyment of life 5 What TIME of day is your pain at its worst?  varies Sleep (in general) Fair  Pain is worse with: bending Pain improves with: no answer Relief from Meds: 3  Mobility use a wheelchair needs help with transfers  Function I need assistance with the following:  dressing, bathing, toileting, meal prep, household duties and shopping  Neuro/Psych bladder control problems bowel control problems weakness trouble walking dizziness confusion  Prior Studies Any changes since last visit?  no  Physicians involved in your care Any changes since last visit?  no   Family History  Problem Relation Age of Onset  . Asthma Mother   . Asthma Father   . Prostate cancer Father   . Stroke Brother   . Thyroid disease Neg Hx    Social History   Social History  . Marital Status: Widowed    Spouse Name: N/A  . Number of Children: 2  . Years of Education: 14   Occupational History  . retired     Restaurant manager, fast food   Social History Main Topics  . Smoking status: Never Smoker   . Smokeless tobacco: Never Used  . Alcohol Use: No  . Drug Use: No  . Sexual Activity: No   Other Topics Concern  . None   Social History Narrative   Widowed, lived alone  prior to CVA 03/2014   SNF at [facility], then home 07/2014 with dtr   Right handed   Caffeine use- occasionally drinks tea   Past Surgical History  Procedure Laterality Date  . Abdominal hysterectomy  1976  . Total hip arthroplasty  1998     right  . Tonsillectomy    . Breast surgery      biopsy  . Radiology with anesthesia Left 03/08/2014    Procedure: RADIOLOGY WITH ANESTHESIA;  Surgeon: Rob Hickman, MD;  Location: [place];  Service: Radiology;  Laterality: Left;   Past Medical History  Diagnosis Date  . HYPERTENSION   . HYPERTHYROIDISM   . OBESITY   . ARTHRITIS   . Posttraumatic stress disorder   . INSOMNIA   . MIGRAINE HEADACHE   . Hyperthyroidism     s/p I-131 ablation 03/2011 of multinod goiter  . Arthritis   . Stroke [facility]) 03/2014    dysarthria  . Diverticulosis   . Pulmonary embolism ([place]) 03/2014    with DVT   BP 134/79 mmHg  Pulse 80  Resp 16  Opioid Risk Score:   Fall Risk Score:  `1  Depression screen PHQ 2/9  Depression screen [facility] 2/9 04/29/2015 03/18/2015 02/04/2015 11/26/2014 09/17/2014 08/21/2014 08/20/2014  Decreased Interest 0 0 0 0 0 0 0  Down, Depressed, Hopeless 1 1 1 1  0 0 1  PHQ - 2  Score 1 1 1 1  0 0 1     Review of Systems  Respiratory: Positive for cough.   Cardiovascular: Positive for leg swelling.  Gastrointestinal: Positive for constipation.  All other systems reviewed and are negative.      Objective:   Physical Exam  Constitutional: She is oriented to person, place, and time. She appears well-developed and well-nourished.  HENT:  Head: Normocephalic and atraumatic.  Eyes: Conjunctivae and EOM are normal. Pupils are equal, round, and reactive to light.  Neck: Normal range of motion.  Neurological: She is alert and oriented to person, place, and time.  Psychiatric: She has a normal mood and affect.  Nursing note and vitals reviewed.   Motor: No active movement in the right upper extremity She has Ashworth grade 2 at  the pectoralis, 2 at the finger flexors and wrist flexor and 2 at the pronator in the right upper extremity. Biceps tone is 1   Patient remains severely aphasic, global She is unable to follow simple commands. She has pain with shoulder abduction on the right side. No pain with knee range of motion. No evidence of any swelling bilaterally. No crepitus. There is valgus deformity bilateral knees.      Assessment & Plan:  1. Improvement in upper extremity tone after Botox injection. The pectoralis tone did not change a lot, do not think this is helping her functionally. Will not reinject next time and monitor for increasing tone. Would recommend 300 units to be concentrated in the wrist and finger flexors as well as the pronator FCR50 FDS50 FDP50 FPL50   PT50 FCU50  2. Shoulder pain, post stroke right side, does not appear to be primarily tone related I would've expected some more improvements after the Botox of the pectoralis. Will inject glenohumeral given her restricted range of motion may have adhesive capsulitis  Shoulder injection Right With ultrasound guidance)  Indication:RightShoulder pain not relieved by medication management and other conservative care.  Informed consent was obtained after describing risks and benefits of the procedure with the patient, this includes bleeding, bruising, infection and medication side effects. The patient wishes to proceed and has given written consent. Patient was placed in a seated position. TheRightshoulder was marked and prepped with betadine in the subacromial area. A 25-gauge 1-1/2 inch needle was inserted into the subacromial area. After negative draw back for blood, a solution containing 1 mL of 6 mg per ML betamethasone and 4 mL of 1% lidocaine was injected. A band aid was applied. The patient tolerated the procedure well. Post procedure instructions were given.

## 2015-04-29 NOTE — Patient Instructions (Signed)
Please avoid keeping the right elbow flexed. Try to keep it straight and elevated. Avoid close getting bunched up at the elbow

## 2015-04-29 NOTE — Telephone Encounter (Signed)
RX for Arm Rest has been mailed to Pt, RX for E-stim machine has been faxed to [facility]. Pts daughter is aware.

## 2015-04-30 ENCOUNTER — Encounter: Payer: Self-pay | Admitting: Internal Medicine

## 2015-04-30 DIAGNOSIS — H903 Sensorineural hearing loss, bilateral: Secondary | ICD-10-CM | POA: Insufficient documentation

## 2015-04-30 DIAGNOSIS — M6289 Other specified disorders of muscle: Secondary | ICD-10-CM | POA: Diagnosis not present

## 2015-04-30 DIAGNOSIS — R482 Apraxia: Secondary | ICD-10-CM | POA: Diagnosis not present

## 2015-04-30 DIAGNOSIS — G8191 Hemiplegia, unspecified affecting right dominant side: Secondary | ICD-10-CM | POA: Diagnosis not present

## 2015-04-30 DIAGNOSIS — G8110 Spastic hemiplegia affecting unspecified side: Secondary | ICD-10-CM | POA: Diagnosis not present

## 2015-05-02 DIAGNOSIS — G8110 Spastic hemiplegia affecting unspecified side: Secondary | ICD-10-CM | POA: Diagnosis not present

## 2015-05-02 DIAGNOSIS — M6289 Other specified disorders of muscle: Secondary | ICD-10-CM | POA: Diagnosis not present

## 2015-05-02 DIAGNOSIS — R482 Apraxia: Secondary | ICD-10-CM | POA: Diagnosis not present

## 2015-05-02 DIAGNOSIS — G8191 Hemiplegia, unspecified affecting right dominant side: Secondary | ICD-10-CM | POA: Diagnosis not present

## 2015-05-06 ENCOUNTER — Ambulatory Visit (INDEPENDENT_AMBULATORY_CARE_PROVIDER_SITE_OTHER): Payer: Medicare Other | Admitting: Endocrinology

## 2015-05-06 ENCOUNTER — Encounter: Payer: Self-pay | Admitting: Endocrinology

## 2015-05-06 VITALS — BP 110/70 | HR 93 | Temp 98.0°F | Resp 12 | Ht 60.0 in

## 2015-05-06 DIAGNOSIS — E0590 Thyrotoxicosis, unspecified without thyrotoxic crisis or storm: Secondary | ICD-10-CM | POA: Diagnosis not present

## 2015-05-06 NOTE — Patient Instructions (Addendum)
blood tests are requested for you today.  We'll let you know about the results. if ever you have fever while taking methimazole, stop it and call us, even if the reason is obvious, because of the risk of a rare side-effect.   Please come back for a follow-up appointment in 3 months.  

## 2015-05-06 NOTE — Progress Notes (Signed)
Subjective:    Patient ID: Sandy Barrett, female    DOB: [birth year], 82 y.o.   MRN: [id number]  HPI Pt returns for f/u of hyperthyroidism (due to a multinodular goiter; she had RAI in 2013; hyperthyroidism resolved, but it recurred in 2016; she has never had bx; she had a CVA in 2016, so she is not a surgical candidate, and can't be isolated for another dose of RAI).  Since on the tapazole, pt states she feels slightly in general.   Past Medical History  Diagnosis Date  . HYPERTENSION   . HYPERTHYROIDISM   . OBESITY   . ARTHRITIS   . Posttraumatic stress disorder   . INSOMNIA   . MIGRAINE HEADACHE   . Hyperthyroidism     s/p I-131 ablation 03/2011 of multinod goiter  . Arthritis   . Stroke [facility]) 03/2014    dysarthria  . Diverticulosis   . Pulmonary embolism ([place]) 03/2014    with DVT    Past Surgical History  Procedure Laterality Date  . Abdominal hysterectomy  1976  . Total hip arthroplasty  1998     right  . Tonsillectomy    . Breast surgery      biopsy  . Radiology with anesthesia Left 03/08/2014    Procedure: RADIOLOGY WITH ANESTHESIA;  Surgeon: Rob Hickman, MD;  Location: [place];  Service: Radiology;  Laterality: Left;    Social History   Social History  . Marital Status: Widowed    Spouse Name: N/A  . Number of Children: 2  . Years of Education: 14   Occupational History  . retired     Restaurant manager, fast food   Social History Main Topics  . Smoking status: Never Smoker   . Smokeless tobacco: Never Used  . Alcohol Use: No  . Drug Use: No  . Sexual Activity: No   Other Topics Concern  . Not on file   Social History Narrative   Widowed, lived alone prior to CVA 03/2014   SNF at [facility], then home 07/2014 with dtr   Right handed   Caffeine use- occasionally drinks tea    Current Outpatient Prescriptions on File Prior to Visit  Medication Sig Dispense Refill  . acetaminophen (TYLENOL) 325 MG tablet Take 1-2 tablets (325-650 mg total) by  mouth every 4 (four) hours as needed for mild pain.    [name] diclofenac sodium (VOLTAREN) 1 % GEL Apply 2 g topically 3 (three) times daily. 100 g 11  . gabapentin (NEURONTIN) 100 MG capsule TAKE 1 CAPSULE (100 MG TOTAL) BY MOUTH 3 (THREE) TIMES DAILY. 90 capsule 5  . guaiFENesin-dextromethorphan (ROBITUSSIN DM) 100-10 MG/5ML syrup Take 5 mLs by mouth every 4 (four) hours as needed for cough. 118 mL 0  . methimazole (TAPAZOLE) 5 MG tablet Take 1 tablet (5 mg total) by mouth 3 (three) times a week. 12 tablet 2  . Multiple Vitamins-Minerals (DECUBI-VITE) CAPS Take 1 capsule by mouth daily. Take 1 capsule by mouth daily for wound healing    . nitrofurantoin, macrocrystal-monohydrate, (MACROBID) 100 MG capsule Take 1 capsule by mouth daily.    [name] omeprazole (PRILOSEC) 20 MG capsule TAKE 1 CAPSULE (20 MG TOTAL) BY MOUTH DAILY. ON AN EMPTY STOMACH 90 capsule 1  . Probiotic Product (ALIGN) 4 MG CAPS Take 1 capsule by mouth daily.     . propranolol (INDERAL) 10 MG tablet Take 1 tablet (10 mg total) by mouth 2 (two) times daily. 60 tablet 3  . senna-docusate (  SENOKOT-S) 8.6-50 MG per tablet Take 2 tablets by mouth daily as needed for mild constipation.     [name] terconazole (TERAZOL 7) 0.4 % vaginal cream Place 1 applicator vaginally at bedtime. 90 g 3  . traMADol (ULTRAM) 50 MG tablet Take 1-2 tablets (50-100 mg total) by mouth 3 (three) times daily as needed. 180 tablet 1  . traZODone (DESYREL) 50 MG tablet TAKE 1 TABLET (50 MG TOTAL) BY MOUTH AT BEDTIME AS NEEDED FOR SLEEP. 30 tablet 5  . warfarin (COUMADIN) 4 MG tablet Take 6 mg (1 tablet) on Mon/Wed/Fri. And take 4 mg (1 1/2 tablets) on Sun/Tues/Thurs/Sat. Or as directed. 40 tablet 3   No current facility-administered medications on file prior to visit.    No Known Allergies  Family History  Problem Relation Age of Onset  . Asthma Mother   . Asthma Father   . Prostate cancer Father   . Stroke Brother   . Thyroid disease Neg Hx     BP 110/70 mmHg   Pulse 93  Temp(Src) 98 F (36.7 C) (Oral)  Resp 12  Ht 5' (1.524 m)  Wt   SpO2 88%  Review of Systems Denies fever.      Objective:   Physical Exam VITAL SIGNS:  See vs page.   GENERAL: no distress.  In wheelchair NECK: I think I can only feel the top of the goiter.       Assessment & Plan:  Hyperthyroidism: due for recheck.  Patient is advised the following: Patient Instructions  blood tests are requested for you today.  We'll let you know about the results.  if ever you have fever while taking methimazole, stop it and call us, even if the reason is obvious, because of the risk of a rare side-effect. Please come back for a follow-up appointment in 3 months.

## 2015-05-08 DIAGNOSIS — G8191 Hemiplegia, unspecified affecting right dominant side: Secondary | ICD-10-CM | POA: Diagnosis not present

## 2015-05-08 DIAGNOSIS — M6289 Other specified disorders of muscle: Secondary | ICD-10-CM | POA: Diagnosis not present

## 2015-05-08 DIAGNOSIS — G8110 Spastic hemiplegia affecting unspecified side: Secondary | ICD-10-CM | POA: Diagnosis not present

## 2015-05-08 DIAGNOSIS — R482 Apraxia: Secondary | ICD-10-CM | POA: Diagnosis not present

## 2015-05-09 DIAGNOSIS — I808 Phlebitis and thrombophlebitis of other sites: Secondary | ICD-10-CM | POA: Diagnosis not present

## 2015-05-09 DIAGNOSIS — N6489 Other specified disorders of breast: Secondary | ICD-10-CM | POA: Diagnosis not present

## 2015-05-09 DIAGNOSIS — R600 Localized edema: Secondary | ICD-10-CM | POA: Diagnosis not present

## 2015-05-13 ENCOUNTER — Telehealth: Payer: Self-pay | Admitting: Internal Medicine

## 2015-05-13 ENCOUNTER — Other Ambulatory Visit: Payer: Medicare Other

## 2015-05-13 NOTE — Telephone Encounter (Signed)
She should be seen.  Call her daughter and see exactly what symptom she is having.

## 2015-05-13 NOTE — Telephone Encounter (Signed)
Please advise if she should come in for a visit.

## 2015-05-13 NOTE — Telephone Encounter (Signed)
Chest congestion, girgiling, tried cough medication and Tessalon. Much worse when lying down. Appt scheduled with Joycelyn Schmid, NP 5/10

## 2015-05-13 NOTE — Telephone Encounter (Signed)
Daughter called and said that she pt is having chest congestion for the last 5 days.  They have tried several different things and she is still having it.  Can you call her when you get a chance?

## 2015-05-15 ENCOUNTER — Other Ambulatory Visit: Payer: Self-pay | Admitting: Family

## 2015-05-15 ENCOUNTER — Ambulatory Visit (INDEPENDENT_AMBULATORY_CARE_PROVIDER_SITE_OTHER)
Admission: RE | Admit: 2015-05-15 | Discharge: 2015-05-15 | Disposition: A | Discharge status: Home / Self Care | Payer: Medicare Other | Source: Ambulatory Visit | Attending: Family | Admitting: Family

## 2015-05-15 ENCOUNTER — Ambulatory Visit (INDEPENDENT_AMBULATORY_CARE_PROVIDER_SITE_OTHER): Payer: Medicare Other | Admitting: Family

## 2015-05-15 ENCOUNTER — Encounter: Payer: Self-pay | Admitting: Family

## 2015-05-15 VITALS — BP 102/64 | HR 82 | Temp 98.4°F | Resp 16

## 2015-05-15 DIAGNOSIS — R0981 Nasal congestion: Secondary | ICD-10-CM | POA: Diagnosis not present

## 2015-05-15 DIAGNOSIS — R05 Cough: Secondary | ICD-10-CM

## 2015-05-15 DIAGNOSIS — R9389 Abnormal findings on diagnostic imaging of other specified body structures: Secondary | ICD-10-CM

## 2015-05-15 DIAGNOSIS — J189 Pneumonia, unspecified organism: Secondary | ICD-10-CM

## 2015-05-15 DIAGNOSIS — R059 Cough, unspecified: Secondary | ICD-10-CM

## 2015-05-15 MED ORDER — ALBUTEROL SULFATE (2.5 MG/3ML) 0.083% IN NEBU: 2.5000 mg | INHALATION_SOLUTION | Freq: Once | RESPIRATORY_TRACT | Status: DC

## 2015-05-15 MED ORDER — BENZONATATE 100 MG PO CAPS: 100.0000 mg | ORAL_CAPSULE | Freq: Three times a day (TID) | ORAL | Status: DC | PRN

## 2015-05-15 MED ORDER — AZITHROMYCIN 250 MG PO TABS: ORAL_TABLET | ORAL | Status: DC

## 2015-05-15 MED ORDER — ALBUTEROL SULFATE HFA 108 (90 BASE) MCG/ACT IN AERS: 2.0000 | INHALATION_SPRAY | Freq: Four times a day (QID) | RESPIRATORY_TRACT | Status: DC | PRN

## 2015-05-15 MED ORDER — CEFDINIR 300 MG PO CAPS: 300.0000 mg | ORAL_CAPSULE | Freq: Two times a day (BID) | ORAL | Status: DC

## 2015-05-15 NOTE — Patient Instructions (Signed)
Appt with Coumadin clinic  Use albuterol every 6 hours for first 24 hours to get good medication into the lungs and loosen congestion; after, you may use as needed and eventually stop all together when cough resolves.  Please take cough medication at night only as needed. As we discussed, I do not recommend dosing throughout the day as coughing is a protective mechanism . It also helps to break up thick mucous.  Do not take cough suppressants with alcohol as can lead to trouble breathing. Advise caution if taking cough suppressant and operating machinery ( i.e driving a car) as you may feel very tired.     Increase intake of clear fluids. Congestion is best treated by hydration, when mucus is wetter, it is thinner, less sticky, and easier to expel from the body, either through coughing up drainage, or by blowing your nose.   Get plenty of rest.   Use saline nasal drops and blow your nose frequently. Run a humidifier at night and elevate the head of the bed. Vicks Vapor rub will help with congestion and cough. Steam showers and sinus massage for congestion.   Use Acetaminophen or Ibuprofen as needed for fever or pain. Avoid second hand smoke. Even the smallest exposure will worsen symptoms.   Over the counter medications you can try include Delsym for cough, a decongestant for congestion, and Mucinex or Robitussin as an expectorant. Be sure to just get the plain Mucinex or Robitussin that just has one medication (Guaifenesen). We don't recommend the combination products. Note, be sure to drink two glasses of water with each dose of Mucinex as the medication will not work well without adequate hydration.   You can also try a teaspoon of honey to see if this will help reduce cough. Throat lozenges can sometimes be beneficial as well.    This illness will typically last 7 - 10 days.   Please follow up with our clinic if you develop a fever greater than 101 F, symptoms worsen, or do not resolve in  the next week.

## 2015-05-15 NOTE — Progress Notes (Signed)
Pre visit review using our clinic review tool, if applicable. No additional management support is needed unless otherwise documented below in the visit note. 

## 2015-05-15 NOTE — Progress Notes (Signed)
Subjective:    Patient ID: Sandy Barrett, female    DOB: [birth year], 82 y.o.   MRN: [id number]   Sandy Barrett is a 82 y.o. female who presents today for an acute visit.    HPI Comments: Patient is accompanied by daughter does most of the talking. Patient has severe dysphagia.  Per daughter, patient has always had low pulse ox readings.  Cough This is a new problem. The current episode started in the past 7 days. The problem has been gradually improving. The problem occurs every few hours. The cough is productive of sputum. Pertinent negatives include no chest pain, chills, fever, myalgias, shortness of breath or wheezing. The symptoms are aggravated by lying down. Treatments tried: mucinex. The treatment provided mild relief. There is no history of asthma, COPD or pneumonia.   Past Medical History  Diagnosis Date  . HYPERTENSION   . HYPERTHYROIDISM   . OBESITY   . ARTHRITIS   . Posttraumatic stress disorder   . INSOMNIA   . MIGRAINE HEADACHE   . Hyperthyroidism     s/p I-131 ablation 03/2011 of multinod goiter  . Arthritis   . Stroke [facility]) 03/2014    dysarthria  . Diverticulosis   . Pulmonary embolism ([place]) 03/2014    with DVT   Allergies: Review of patient's allergies indicates no known allergies. Current Outpatient Prescriptions on File Prior to Visit  Medication Sig Dispense Refill  . acetaminophen (TYLENOL) 325 MG tablet Take 1-2 tablets (325-650 mg total) by mouth every 4 (four) hours as needed for mild pain.    [name] diclofenac sodium (VOLTAREN) 1 % GEL Apply 2 g topically 3 (three) times daily. 100 g 11  . gabapentin (NEURONTIN) 100 MG capsule TAKE 1 CAPSULE (100 MG TOTAL) BY MOUTH 3 (THREE) TIMES DAILY. 90 capsule 5  . methimazole (TAPAZOLE) 5 MG tablet Take 1 tablet (5 mg total) by mouth 3 (three) times a week. 12 tablet 2  . Multiple Vitamins-Minerals (DECUBI-VITE) CAPS Take 1 capsule by mouth daily. Take 1 capsule by mouth daily for wound healing    . omeprazole  (PRILOSEC) 20 MG capsule TAKE 1 CAPSULE (20 MG TOTAL) BY MOUTH DAILY. ON AN EMPTY STOMACH 90 capsule 1  . Probiotic Product (ALIGN) 4 MG CAPS Take 1 capsule by mouth daily.     . propranolol (INDERAL) 10 MG tablet Take 1 tablet (10 mg total) by mouth 2 (two) times daily. 60 tablet 3  . senna-docusate (SENOKOT-S) 8.6-50 MG per tablet Take 2 tablets by mouth daily as needed for mild constipation.     . traMADol (ULTRAM) 50 MG tablet Take 1-2 tablets (50-100 mg total) by mouth 3 (three) times daily as needed. 180 tablet 1  . traZODone (DESYREL) 50 MG tablet TAKE 1 TABLET (50 MG TOTAL) BY MOUTH AT BEDTIME AS NEEDED FOR SLEEP. 30 tablet 5  . warfarin (COUMADIN) 4 MG tablet Take 6 mg (1 tablet) on Mon/Wed/Fri. And take 4 mg (1 1/2 tablets) on Sun/Tues/Thurs/Sat. Or as directed. 40 tablet 3  . guaiFENesin-dextromethorphan (ROBITUSSIN DM) 100-10 MG/5ML syrup Take 5 mLs by mouth every 4 (four) hours as needed for cough. (Patient not taking: Reported on 05/15/2015) 118 mL 0  . nitrofurantoin, macrocrystal-monohydrate, (MACROBID) 100 MG capsule Take 1 capsule by mouth daily. Reported on 05/15/2015    . terconazole (TERAZOL 7) 0.4 % vaginal cream Place 1 applicator vaginally at bedtime. (Patient not taking: Reported on 05/15/2015) 90 g 3   No current facility-administered medications on file  prior to visit.    Social History  Substance Use Topics  . Smoking status: Never Smoker   . Smokeless tobacco: Never Used  . Alcohol Use: No    Review of Systems  Constitutional: Negative for fever and chills.  HENT: Positive for congestion.   Respiratory: Positive for cough. Negative for shortness of breath and wheezing.   Cardiovascular: Negative for chest pain and palpitations.  Gastrointestinal: Negative for nausea and vomiting.  Musculoskeletal: Negative for myalgias.      Objective:    BP 102/64 mmHg  Pulse 82  Temp(Src) 98.4 F (36.9 C) (Oral)  Resp 16  Wt   SpO2 90%   Physical Exam    Constitutional: She appears well-developed and well-nourished.  HENT:  Head: Normocephalic and atraumatic.  Right Ear: Hearing, tympanic membrane, external ear and ear canal normal. No drainage, swelling or tenderness. No foreign bodies. Tympanic membrane is not erythematous and not bulging. No middle ear effusion. No decreased hearing is noted.  Left Ear: Hearing, tympanic membrane, external ear and ear canal normal. No drainage, swelling or tenderness. No foreign bodies. Tympanic membrane is not erythematous and not bulging.  No middle ear effusion. No decreased hearing is noted.  Nose: Nose normal. No rhinorrhea. Right sinus exhibits no maxillary sinus tenderness and no frontal sinus tenderness. Left sinus exhibits no maxillary sinus tenderness and no frontal sinus tenderness.  Mouth/Throat: Uvula is midline, oropharynx is clear and moist and mucous membranes are normal. No oropharyngeal exudate, posterior oropharyngeal edema, posterior oropharyngeal erythema or tonsillar abscesses.  Eyes: Conjunctivae are normal.  Cardiovascular: Regular rhythm, normal heart sounds and normal pulses.   Capillary refill > 2 seconds. Pale nail beds. Palpable radial pulses bilaterally. No LE edema.   Pulmonary/Chest: Effort normal. She has decreased breath sounds in the right lower field and the left lower field. She has no wheezes. She has rhonchi in the right lower field and the left lower field. She has no rales.  Lymphadenopathy:       Head (right side): No submental, no submandibular, no tonsillar, no preauricular, no posterior auricular and no occipital adenopathy present.       Head (left side): No submental, no submandibular, no tonsillar, no preauricular, no posterior auricular and no occipital adenopathy present.    She has no cervical adenopathy.  Neurological: She is alert.  In wheelchair, severe dysphagia.  Skin: Skin is warm and dry.  Psychiatric: She has a normal mood and affect. Her speech is  normal and behavior is normal. Thought content normal.  Vitals reviewed.     Patient felt significantly better after albuterol treatment. Lung sounds  Increased. Rhonchi persist.   Assessment & Plan:  1. Cough Working diagnosis of pneumonia. Decreased bilateral breath sounds and rhonchi. Improved after nebulizer treatment. No acute respiratory distress. Patient is afebrile. Per chart review and discussion with daughter, patient's pulse ox is typically low. In our office today SaO290%, prior office visit shows SaO2 88%. Poor capillary refill so I suspect that SaO2 machine is not accurate.   - DG Chest 2 View - albuterol (PROVENTIL) (2.5 MG/3ML) 0.083% nebulizer solution 2.5 mg; Take 3 mLs (2.5 mg total) by nebulization once. - albuterol (PROVENTIL HFA) 108 (90 Base) MCG/ACT inhaler; Inhale 2 puffs into the lungs every 6 (six) hours as needed for wheezing or shortness of breath.  Dispense: 1 Inhaler; Refill: 1 - benzonatate (TESSALON PERLES) 100 MG capsule; Take 1 capsule (100 mg total) by mouth 3 (three) times daily as  needed for cough.  Dispense: 30 capsule; Refill: 1 - azithromycin (ZITHROMAX) 250 MG tablet; Tale 500 mg PO on day 1, then 250 mg PO q24h x 4 days.  Dispense: 6 tablet; Refill: 0     I am having Ms. Majerus maintain her ALIGN, acetaminophen, DECUBI-VITE, senna-docusate, diclofenac sodium, guaiFENesin-dextromethorphan, omeprazole, gabapentin, traZODone, nitrofurantoin (macrocrystal-monohydrate), methimazole, propranolol, traMADol, terconazole, and warfarin.   No orders of the defined types were placed in this encounter.     Start medications as prescribed and explained to patient on After Visit Summary ( AVS). Risks, benefits, and alternatives of the medications and treatment plan prescribed today were discussed, and patient expressed understanding.   Education regarding symptom management and diagnosis given to patient.   Follow-up:Plan follow-up as discussed or as needed  if any worsening symptoms or change in condition.   Continue to follow with [name], MD for routine health maintenance.   [name] Carrick and I agreed with plan.   Mable Paris, FNP

## 2015-05-17 ENCOUNTER — Ambulatory Visit (INDEPENDENT_AMBULATORY_CARE_PROVIDER_SITE_OTHER): Payer: Medicare Other | Admitting: General Practice

## 2015-05-17 ENCOUNTER — Other Ambulatory Visit (INDEPENDENT_AMBULATORY_CARE_PROVIDER_SITE_OTHER): Payer: Medicare Other

## 2015-05-17 ENCOUNTER — Other Ambulatory Visit: Payer: Medicare Other

## 2015-05-17 DIAGNOSIS — I639 Cerebral infarction, unspecified: Secondary | ICD-10-CM | POA: Diagnosis not present

## 2015-05-17 DIAGNOSIS — E0590 Thyrotoxicosis, unspecified without thyrotoxic crisis or storm: Secondary | ICD-10-CM

## 2015-05-17 LAB — T4, FREE: Free T4: 1.07 ng/dL (ref 0.60–1.60)

## 2015-05-17 LAB — TSH: TSH: 0.97 u[IU]/mL (ref 0.35–4.50)

## 2015-05-17 LAB — POCT INR: INR: 2

## 2015-05-17 NOTE — Progress Notes (Signed)
Pre visit review using our clinic review tool, if applicable. No additional management support is needed unless otherwise documented below in the visit note. 

## 2015-05-17 NOTE — Progress Notes (Signed)
I have reviewed and agree with the plan. 

## 2015-05-21 DIAGNOSIS — R482 Apraxia: Secondary | ICD-10-CM | POA: Diagnosis not present

## 2015-05-21 DIAGNOSIS — G8191 Hemiplegia, unspecified affecting right dominant side: Secondary | ICD-10-CM | POA: Diagnosis not present

## 2015-05-21 DIAGNOSIS — G8110 Spastic hemiplegia affecting unspecified side: Secondary | ICD-10-CM | POA: Diagnosis not present

## 2015-05-21 DIAGNOSIS — M6289 Other specified disorders of muscle: Secondary | ICD-10-CM | POA: Diagnosis not present

## 2015-05-24 ENCOUNTER — Ambulatory Visit: Payer: Medicare Other

## 2015-05-27 DIAGNOSIS — G8191 Hemiplegia, unspecified affecting right dominant side: Secondary | ICD-10-CM | POA: Diagnosis not present

## 2015-05-27 DIAGNOSIS — R482 Apraxia: Secondary | ICD-10-CM | POA: Diagnosis not present

## 2015-05-27 DIAGNOSIS — M6289 Other specified disorders of muscle: Secondary | ICD-10-CM | POA: Diagnosis not present

## 2015-05-27 DIAGNOSIS — G8110 Spastic hemiplegia affecting unspecified side: Secondary | ICD-10-CM | POA: Diagnosis not present

## 2015-05-29 ENCOUNTER — Ambulatory Visit (INDEPENDENT_AMBULATORY_CARE_PROVIDER_SITE_OTHER): Payer: Medicare Other | Admitting: Podiatry

## 2015-05-29 ENCOUNTER — Telehealth: Payer: Self-pay | Admitting: Internal Medicine

## 2015-05-29 DIAGNOSIS — M79674 Pain in right toe(s): Secondary | ICD-10-CM

## 2015-05-29 DIAGNOSIS — M79675 Pain in left toe(s): Secondary | ICD-10-CM | POA: Diagnosis not present

## 2015-05-29 DIAGNOSIS — B351 Tinea unguium: Secondary | ICD-10-CM

## 2015-05-29 NOTE — Telephone Encounter (Signed)
States patient has difficulty getting out of wheelchair.  If patient does not have to lay down that would be great.

## 2015-05-29 NOTE — Telephone Encounter (Signed)
Patient would like to know if CT and x ray scheduled for Friday ordered by Arnette is still necessary.

## 2015-05-29 NOTE — Progress Notes (Signed)
Patient ID: Sandy Barrett, female   DOB: 12-21-1932, 82 y.o.   MRN: [id number]   Subjective: This patient presents today with daughter present to treatment room complaining of thickened and uncomfortable hallux toenails left greater than right over an undetermined amount of time. The hallux nails primary left to become more uncomfortable and thickened when patient has direct shoe pressure over the toe. Patient denies any bacterial like infections or previous podiatric care  Patient has history of stroke with him I paresis right side and is in a seated in a wheelchair and unable to transfer to treatment chair  Objective: Pleasant orientated 3 patient with daughter present to treatment room  Vascular: DP pulses 2/4 bilaterally PT pulses 1/4 bilaterally Capillary reflex immediate bilaterally  Dermatological: The hallux nails left greater right are deformed, discolored and hypertrophic The remaining toenails have texture and color changes with minimal deformity and palpable tenderness  Neurological: Ankle reflexes nonreactive right weekly reactive left Sensation to 10 g monofilament wire 0/5 right and 4/5 left  Musculoskeletal: Dorsi flexion and plantar flexion right 0/5 Dorsi flexion and plantar flexion left 5/5  Assessment: Stroke related right lower extremity weakness Symptomatic onychomycoses 6-10 with maximum symptoms and the hallux toenails   Plan:  Debridement of toenails 6-10 mechanically and electrically without any bleeding   Reappoint 3 months

## 2015-05-30 NOTE — Telephone Encounter (Signed)
Spoke to pt dtr (Camile) and gave provider advise below. Dtr stated understanding. CT is schedule for tomorrow.

## 2015-05-30 NOTE — Telephone Encounter (Signed)
I would like for patient to get CT as her chest x-ray has shown deviation of trachea; the CT is ordered for further evaluation of this.

## 2015-05-31 ENCOUNTER — Other Ambulatory Visit: Payer: Self-pay | Admitting: Family

## 2015-05-31 ENCOUNTER — Ambulatory Visit
Admission: RE | Admit: 2015-05-31 | Discharge: 2015-05-31 | Disposition: A | Discharge status: Home / Self Care | Payer: Medicare Other | Source: Ambulatory Visit | Attending: Family | Admitting: Family

## 2015-05-31 DIAGNOSIS — J189 Pneumonia, unspecified organism: Secondary | ICD-10-CM | POA: Diagnosis not present

## 2015-05-31 DIAGNOSIS — J811 Chronic pulmonary edema: Secondary | ICD-10-CM | POA: Diagnosis not present

## 2015-05-31 DIAGNOSIS — R9389 Abnormal findings on diagnostic imaging of other specified body structures: Secondary | ICD-10-CM

## 2015-06-04 DIAGNOSIS — G8110 Spastic hemiplegia affecting unspecified side: Secondary | ICD-10-CM | POA: Diagnosis not present

## 2015-06-04 DIAGNOSIS — G8191 Hemiplegia, unspecified affecting right dominant side: Secondary | ICD-10-CM | POA: Diagnosis not present

## 2015-06-04 DIAGNOSIS — M6289 Other specified disorders of muscle: Secondary | ICD-10-CM | POA: Diagnosis not present

## 2015-06-04 DIAGNOSIS — R482 Apraxia: Secondary | ICD-10-CM | POA: Diagnosis not present

## 2015-06-05 ENCOUNTER — Telehealth: Payer: Self-pay

## 2015-06-05 NOTE — Telephone Encounter (Signed)
Ok to have dental work,but should ideally be off warfarin for as little of a time period as possible - if she does not need to discontinue she should not - not taking the warfarin will increase her risk of another stroke.  Will let Sandy Barrett result the CT.    Sandy Barrett can you write a note to the dentist - stating should ideally not be off warfarin, but ok to stop if needed, but need to be the least amount of time as possible due to increased risk of stroke.

## 2015-06-05 NOTE — Telephone Encounter (Signed)
Spoke to [place] (pt dtr) and she wanted to know if mom could have dental work and if so, could she have a letter stating same.  FYI: I don't have the CT results release to me - if you would like me to inform [facility] of results or wait til Joycelyn Schmid results.   [employer] [address].

## 2015-06-06 ENCOUNTER — Telehealth: Payer: Self-pay

## 2015-06-06 ENCOUNTER — Encounter: Payer: Self-pay | Admitting: Family

## 2015-06-06 ENCOUNTER — Encounter: Payer: Self-pay | Admitting: Emergency Medicine

## 2015-06-06 DIAGNOSIS — R922 Inconclusive mammogram: Secondary | ICD-10-CM | POA: Insufficient documentation

## 2015-06-06 DIAGNOSIS — J398 Other specified diseases of upper respiratory tract: Secondary | ICD-10-CM | POA: Insufficient documentation

## 2015-06-06 NOTE — Telephone Encounter (Signed)
LVM for pt to call back as soon as possible.   RE: will inform about the dental procedure protocol.

## 2015-06-06 NOTE — Telephone Encounter (Signed)
Patients wife called. Please call her back when you have a chance! Thank you.

## 2015-06-06 NOTE — Telephone Encounter (Signed)
Letter typed and printed for MDs signature

## 2015-06-07 NOTE — Telephone Encounter (Signed)
Lonia Chimera, CMA gave pt dtr information regarding results.

## 2015-06-07 NOTE — Telephone Encounter (Signed)
Spoke with Daughter, letter is being mail. Gave CT results. Pt has appt on 06/18/15 with Dr Quay Burow for Follow-up

## 2015-06-10 ENCOUNTER — Other Ambulatory Visit: Payer: Self-pay | Admitting: Internal Medicine

## 2015-06-10 ENCOUNTER — Ambulatory Visit: Payer: Medicare Other | Admitting: Physical Medicine & Rehabilitation

## 2015-06-12 ENCOUNTER — Other Ambulatory Visit: Payer: Self-pay | Admitting: Internal Medicine

## 2015-06-13 DIAGNOSIS — G8191 Hemiplegia, unspecified affecting right dominant side: Secondary | ICD-10-CM | POA: Diagnosis not present

## 2015-06-13 DIAGNOSIS — G8110 Spastic hemiplegia affecting unspecified side: Secondary | ICD-10-CM | POA: Diagnosis not present

## 2015-06-13 DIAGNOSIS — M6289 Other specified disorders of muscle: Secondary | ICD-10-CM | POA: Diagnosis not present

## 2015-06-13 DIAGNOSIS — R482 Apraxia: Secondary | ICD-10-CM | POA: Diagnosis not present

## 2015-06-13 NOTE — Telephone Encounter (Signed)
Please advise, Last refill 04/16/15 with one refill.

## 2015-06-13 NOTE — Telephone Encounter (Signed)
Faxed script back to [facility].../lmb 

## 2015-06-14 ENCOUNTER — Ambulatory Visit (INDEPENDENT_AMBULATORY_CARE_PROVIDER_SITE_OTHER): Payer: Medicare Other | Admitting: General Practice

## 2015-06-14 DIAGNOSIS — Z5181 Encounter for therapeutic drug level monitoring: Secondary | ICD-10-CM | POA: Diagnosis not present

## 2015-06-14 DIAGNOSIS — I63311 Cerebral infarction due to thrombosis of right middle cerebral artery: Secondary | ICD-10-CM | POA: Diagnosis not present

## 2015-06-14 DIAGNOSIS — I639 Cerebral infarction, unspecified: Secondary | ICD-10-CM

## 2015-06-14 DIAGNOSIS — I82403 Acute embolism and thrombosis of unspecified deep veins of lower extremity, bilateral: Secondary | ICD-10-CM

## 2015-06-14 DIAGNOSIS — I2699 Other pulmonary embolism without acute cor pulmonale: Secondary | ICD-10-CM

## 2015-06-14 LAB — POCT INR: INR: 1.7

## 2015-06-14 NOTE — Progress Notes (Signed)
Pre visit review using our clinic review tool, if applicable. No additional management support is needed unless otherwise documented below in the visit note. 

## 2015-06-18 ENCOUNTER — Encounter: Payer: Self-pay | Admitting: Internal Medicine

## 2015-06-18 ENCOUNTER — Ambulatory Visit (INDEPENDENT_AMBULATORY_CARE_PROVIDER_SITE_OTHER): Payer: Medicare Other | Admitting: Internal Medicine

## 2015-06-18 VITALS — BP 102/60 | HR 64 | Temp 98.6°F | Resp 16

## 2015-06-18 DIAGNOSIS — IMO0002 Reserved for concepts with insufficient information to code with codable children: Secondary | ICD-10-CM

## 2015-06-18 DIAGNOSIS — I69921 Dysphasia following unspecified cerebrovascular disease: Secondary | ICD-10-CM

## 2015-06-18 DIAGNOSIS — I639 Cerebral infarction, unspecified: Secondary | ICD-10-CM | POA: Diagnosis not present

## 2015-06-18 DIAGNOSIS — I69391 Dysphagia following cerebral infarction: Secondary | ICD-10-CM

## 2015-06-18 DIAGNOSIS — R5381 Other malaise: Secondary | ICD-10-CM | POA: Diagnosis not present

## 2015-06-18 DIAGNOSIS — R2689 Other abnormalities of gait and mobility: Secondary | ICD-10-CM | POA: Diagnosis not present

## 2015-06-18 DIAGNOSIS — E042 Nontoxic multinodular goiter: Secondary | ICD-10-CM

## 2015-06-18 DIAGNOSIS — I10 Essential (primary) hypertension: Secondary | ICD-10-CM

## 2015-06-18 DIAGNOSIS — I63312 Cerebral infarction due to thrombosis of left middle cerebral artery: Secondary | ICD-10-CM

## 2015-06-18 DIAGNOSIS — R482 Apraxia: Secondary | ICD-10-CM | POA: Diagnosis not present

## 2015-06-18 DIAGNOSIS — E0590 Thyrotoxicosis, unspecified without thyrotoxic crisis or storm: Secondary | ICD-10-CM

## 2015-06-18 DIAGNOSIS — I808 Phlebitis and thrombophlebitis of other sites: Secondary | ICD-10-CM | POA: Insufficient documentation

## 2015-06-18 DIAGNOSIS — G8110 Spastic hemiplegia affecting unspecified side: Secondary | ICD-10-CM

## 2015-06-18 NOTE — Assessment & Plan Note (Signed)
Management per Dr. Ellison 

## 2015-06-18 NOTE — Assessment & Plan Note (Signed)
Stable Continue current medications Continue physical therapy-new referral for a different place in pain management for right upper extremity and physical therapy-poor balance and physical deconditioning and weakness related to stroke

## 2015-06-18 NOTE — Assessment & Plan Note (Signed)
Blood pressure well-controlled Currently taking propranolol at a very small dose-unlikely for her blood pressure, possibly for another reason Unlikely to lower his blood pressure much-we'll continue for now

## 2015-06-18 NOTE — Patient Instructions (Addendum)
Have blood work done when you can.  Test(s) ordered today. Your results will be released to [place] (or called to you) after review, usually within 72hours after test completion. If any changes need to be made, you will be notified at that same time.   Medications reviewed and updated.  No changes recommended at this time.  Your prescription(s) have been submitted to your pharmacy. Please take as directed and contact our office if you believe you are having problem(s) with the medication(s).  A referral was ordered for PT  Please followup in 6 months

## 2015-06-18 NOTE — Progress Notes (Signed)
Subjective:    Patient ID: Sandy Barrett, female    DOB: 1932/07/03, 82 y.o.   MRN: [id number]  HPI She is here for follow up.  Breast changes, right:  She has seen surgery and had a biopsy and was diagnosed with thrombophlebitis of the breast.  Her mammogram showed skin thickening without focal parenchymal abnormality.  She is and has been on a blood thinner.  Her daughters feel it likely does cause her some discomfort, but she is not able to fully express how she feels.   Goiter:  She had a ct of her chest in 05/2015 that showed her enlarged multinodular goiter that extends into the thorax.  It measures at least 9 x 7.2 cm with deviation to the right of the trachea and esophagus.  She has had some Issues with dysphagia related to her stroke. Her daughters have not noticed any recent difficulty with breathing or swallowing. She does have hyperthyroidism and is following with endocrine. She is taking her medication daily as prescribed. But is not present during the day.  Cough: It was gone after the antibiotic.  Restarted recently first thing in the morning and is not present during the day. Her daughters did not notice any issues with shortness of breath, wheezing or fevers. They have not noticed any recent cold symptoms.  No skin breakdown.     Right arm pain, swelling related to the stroke:  She is currently doing Botox and physical therapy. They would like to try changing when you're doing physical therapy to see if they continue more physical therapy in addition to some of the pain management she is having.  Overall they feel she is doing well.   Medications and allergies reviewed with patient and updated if appropriate.  Patient Active Problem List   Diagnosis Date Noted  . Breast density 06/06/2015  . Tracheal deviation 06/06/2015  . Sensorineural hearing loss, bilateral, moderate-moderately severe 04/30/2015  . Abdominal wall lump 03/09/2015  . Change of skin of breast  03/08/2015  . Vaginal yeast infection 03/08/2015  . Hallucination, visual 02/18/2015  . Frequent UTI 02/06/2015  . Decubitus ulcer of sacral region, stage 2 11/26/2014  . Primary osteoarthritis involving multiple joints 07/26/2014  . Pernicious anemia 07/26/2014  . Hearing loss 07/26/2014  . Encounter for therapeutic drug monitoring 07/17/2014  . Spastic hemiparesis affecting dominant side ([place]) 05/31/2014  . DVT of lower extremity, bilateral ([place]) 03/14/2014  . Global aphasia 03/14/2014  . 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 Dysphagia due to recent stroke 03/14/2014  . Aphasia due to stroke 03/13/2014  . Right hemiparesis ([place]) 03/13/2014  . Cerebral infarction due to embolism of left middle cerebral artery ([place])   . Stroke, embolic ([place]) 123456  . Pulmonary embolism ([place]) 03/08/2014  . Back pain 08/29/2013  . Primary localized osteoarthrosis, lower leg 03/06/2013  . IBS (irritable bowel syndrome)   . Multinodular goiter 01/13/2011  . Hyperthyroidism 11/11/2009  . Insomnia 07/03/2009  . POSTTRAUMATIC STRESS DISORDER 08/22/2008  . OBESITY 04/21/2008  . Osteoarthritis 04/21/2008  . MIGRAINE HEADACHE 04/20/2008  . Essential hypertension 04/20/2008    Current Outpatient Prescriptions on File Prior to Visit  Medication Sig Dispense Refill  . acetaminophen (TYLENOL) 325 MG tablet Take 1-2 tablets (325-650 mg total) by mouth every 4 (four) hours as needed for mild pain.    [name] albuterol (PROVENTIL HFA) 108 (90 Base) MCG/ACT inhaler Inhale 2 puffs into the lungs every 6 (six) hours as needed for wheezing  or shortness of breath. 1 Inhaler 1  . benzonatate (TESSALON PERLES) 100 MG capsule Take 1 capsule (100 mg total) by mouth 3 (three) times daily as needed for cough. 30 capsule 1  . cefdinir (OMNICEF) 300 MG capsule Take 1 capsule (300 mg total) by mouth 2 (two) times daily. 20 capsule 0  . diclofenac sodium (VOLTAREN) 1 % GEL APPLY (2GMS) TOPICALLY THREE TIMES DAILY. 300 g 3  .  gabapentin (NEURONTIN) 100 MG capsule TAKE 1 CAPSULE (100 MG TOTAL) BY MOUTH 3 (THREE) TIMES DAILY. 90 capsule 5  . guaiFENesin-dextromethorphan (ROBITUSSIN DM) 100-10 MG/5ML syrup Take 5 mLs by mouth every 4 (four) hours as needed for cough. 118 mL 0  . methimazole (TAPAZOLE) 5 MG tablet Take 1 tablet (5 mg total) by mouth 3 (three) times a week. 12 tablet 2  . Multiple Vitamins-Minerals (DECUBI-VITE) CAPS Take 1 capsule by mouth daily. Take 1 capsule by mouth daily for wound healing    . nitrofurantoin, macrocrystal-monohydrate, (MACROBID) 100 MG capsule Take 1 capsule by mouth daily. Reported on 05/15/2015    . omeprazole (PRILOSEC) 20 MG capsule TAKE 1 CAPSULE (20 MG TOTAL) BY MOUTH DAILY. ON AN EMPTY STOMACH 90 capsule 1  . Probiotic Product (ALIGN) 4 MG CAPS Take 1 capsule by mouth daily.     . propranolol (INDERAL) 10 MG tablet Take 1 tablet (10 mg total) by mouth 2 (two) times daily. 60 tablet 3  . senna-docusate (SENOKOT-S) 8.6-50 MG per tablet Take 2 tablets by mouth daily as needed for mild constipation.     [name] terconazole (TERAZOL 7) 0.4 % vaginal cream Place 1 applicator vaginally at bedtime. 90 g 3  . traMADol (ULTRAM) 50 MG tablet TAKE 1 TO 2 TABLETS BY MOUTH 3 TIMES DAILY AS NEEDED 180 tablet 1  . traZODone (DESYREL) 50 MG tablet TAKE 1 TABLET (50 MG TOTAL) BY MOUTH AT BEDTIME AS NEEDED FOR SLEEP. 30 tablet 5  . warfarin (COUMADIN) 4 MG tablet Take 6 mg (1 tablet) on Mon/Wed/Fri. And take 4 mg (1 1/2 tablets) on Sun/Tues/Thurs/Sat. Or as directed. 40 tablet 3   Current Facility-Administered Medications on File Prior to Visit  Medication Dose Route Frequency Provider Last Rate Last Dose  . albuterol (PROVENTIL) (2.5 MG/3ML) 0.083% nebulizer solution 2.5 mg  2.5 mg Nebulization Once Burnard Hawthorne, FNP        Past Medical History  Diagnosis Date  . HYPERTENSION   . HYPERTHYROIDISM   . OBESITY   . ARTHRITIS   . Posttraumatic stress disorder   . INSOMNIA   . MIGRAINE HEADACHE    . Hyperthyroidism     s/p I-131 ablation 03/2011 of multinod goiter  . Arthritis   . Stroke [facility]) 03/2014    dysarthria  . Diverticulosis   . Pulmonary embolism ([place]) 03/2014    with DVT    Past Surgical History  Procedure Laterality Date  . Abdominal hysterectomy  1976  . Total hip arthroplasty  1998     right  . Tonsillectomy    . Breast surgery      biopsy  . Radiology with anesthesia Left 03/08/2014    Procedure: RADIOLOGY WITH ANESTHESIA;  Surgeon: Rob Hickman, MD;  Location: [facility];  Service: Radiology;  Laterality: Left;    Social History   Social History  . Marital Status: Widowed    Spouse Name: N/A  . Number of Children: 2  . Years of Education: 14   Occupational History  .  retired     Restaurant manager, fast food   Social History Main Topics  . Smoking status: Never Smoker   . Smokeless tobacco: Never Used  . Alcohol Use: No  . Drug Use: No  . Sexual Activity: No   Other Topics Concern  . None   Social History Narrative   Widowed, lived alone prior to CVA 03/2014   SNF at [facility], then home 07/2014 with dtr   Right handed   Caffeine use- occasionally drinks tea    Family History  Problem Relation Age of Onset  . Asthma Mother   . Asthma Father   . Prostate cancer Father   . Stroke Brother   . Thyroid disease Neg Hx    ROS given by daughter and to some extent patient Review of Systems  Constitutional: Negative for fever.  HENT: Negative for trouble swallowing.   Respiratory: Positive for cough. Negative for shortness of breath and wheezing.   Cardiovascular: Negative for chest pain and leg swelling.  Gastrointestinal: Negative for abdominal pain, diarrhea and constipation.       Objective:   Filed Vitals:   06/18/15 1344  BP: 102/60  Pulse: 64  Temp: 98.6 F (37 C)  Resp: 16   Filed Weights   There is no weight on file to calculate BMI.   Physical Exam  Constitutional: She appears well-developed and well-nourished. No  distress.  HENT:  Head: Normocephalic and atraumatic.  Neck: Neck supple. No tracheal deviation (no visible on exam) present. Thyromegaly (right sided, mild on exam) present.  Cardiovascular: Normal rate and regular rhythm.   Pulmonary/Chest: Effort normal. No respiratory distress. She has no wheezes.  Mild dry bibasilar crackles likely from poor inspiratory effort.  Breasts not examined, but right breast is larger than left  Musculoskeletal: She exhibits edema (mild- mod right arm, no edema in legs).  Lymphadenopathy:    She has no cervical adenopathy.  Neurological:  Aphasia due to stroke  Skin: Skin is warm and dry. She is not diaphoretic. No erythema.          Assessment & Plan:   See Problem List for Assessment and Plan of chronic medical problems.

## 2015-06-18 NOTE — Progress Notes (Signed)
Pre visit review using our clinic review tool, if applicable. No additional management support is needed unless otherwise documented below in the visit note. 

## 2015-06-18 NOTE — Assessment & Plan Note (Signed)
Eating without difficulty Will monitor closely

## 2015-06-18 NOTE — Assessment & Plan Note (Signed)
Already on a blood thinner Symptomatic treatment

## 2015-06-20 DIAGNOSIS — G8110 Spastic hemiplegia affecting unspecified side: Secondary | ICD-10-CM | POA: Diagnosis not present

## 2015-06-20 DIAGNOSIS — G8191 Hemiplegia, unspecified affecting right dominant side: Secondary | ICD-10-CM | POA: Diagnosis not present

## 2015-06-20 DIAGNOSIS — R482 Apraxia: Secondary | ICD-10-CM | POA: Diagnosis not present

## 2015-06-20 DIAGNOSIS — M6289 Other specified disorders of muscle: Secondary | ICD-10-CM | POA: Diagnosis not present

## 2015-06-21 ENCOUNTER — Encounter: Payer: Self-pay | Admitting: Physical Medicine & Rehabilitation

## 2015-06-21 ENCOUNTER — Ambulatory Visit (HOSPITAL_BASED_OUTPATIENT_CLINIC_OR_DEPARTMENT_OTHER): Payer: Medicare Other | Admitting: Physical Medicine & Rehabilitation

## 2015-06-21 ENCOUNTER — Encounter: Payer: Medicare Other | Attending: Physical Medicine & Rehabilitation

## 2015-06-21 VITALS — BP 113/73 | HR 83 | Resp 14

## 2015-06-21 DIAGNOSIS — M6289 Other specified disorders of muscle: Secondary | ICD-10-CM | POA: Diagnosis not present

## 2015-06-21 DIAGNOSIS — I69320 Aphasia following cerebral infarction: Secondary | ICD-10-CM | POA: Diagnosis not present

## 2015-06-21 DIAGNOSIS — G8111 Spastic hemiplegia affecting right dominant side: Secondary | ICD-10-CM

## 2015-06-21 DIAGNOSIS — G8190 Hemiplegia, unspecified affecting unspecified side: Secondary | ICD-10-CM | POA: Diagnosis not present

## 2015-06-21 DIAGNOSIS — G8110 Spastic hemiplegia affecting unspecified side: Secondary | ICD-10-CM

## 2015-06-21 DIAGNOSIS — M7541 Impingement syndrome of right shoulder: Secondary | ICD-10-CM | POA: Insufficient documentation

## 2015-06-21 NOTE — Progress Notes (Signed)
   Subjective:    Patient ID: Sandy Barrett, female    DOB: 1932/10/02, 82 y.o.   MRN: [id number]  HPI    Review of Systems     Objective:   Physical Exam        Assessment & Plan:  Botox Injection for spasticity using needle EMG guidance  Dilution: 50 Units/ml Indication: Severe spasticity which interferes with ADL,mobility and/or  hygiene and is unresponsive to medication management and other conservative care Informed consent was obtained after describing risks and benefits of the procedure with the patient. This includes bleeding, bruising, infection, excessive weakness, or medication side effects. A REMS form is on file and signed. Needle: 25g 2" needle electrode Number of units per muscle FCR50 FDS50 FDP50 FPL50   PT50 FCU50 All injections were done after obtaining appropriate EMG activity and after negative drawback for blood. The patient tolerated the procedure well. Post procedure instructions were given. A followup appointment was made.

## 2015-06-21 NOTE — Patient Instructions (Signed)

## 2015-06-24 ENCOUNTER — Telehealth: Payer: Self-pay | Admitting: Physical Medicine & Rehabilitation

## 2015-06-24 NOTE — Telephone Encounter (Signed)
Patient saw Dr. Letta Pate Friday for Botox, and patient has not been feeling well.  She has been throwing up.  Please call Carlin Madero back at [phone].

## 2015-06-25 ENCOUNTER — Other Ambulatory Visit: Payer: Self-pay | Admitting: Endocrinology

## 2015-06-26 ENCOUNTER — Ambulatory Visit (INDEPENDENT_AMBULATORY_CARE_PROVIDER_SITE_OTHER): Payer: Medicare Other | Admitting: Family

## 2015-06-26 ENCOUNTER — Other Ambulatory Visit (INDEPENDENT_AMBULATORY_CARE_PROVIDER_SITE_OTHER): Payer: Medicare Other

## 2015-06-26 ENCOUNTER — Other Ambulatory Visit: Payer: Self-pay | Admitting: Family

## 2015-06-26 ENCOUNTER — Telehealth: Payer: Self-pay | Admitting: Family

## 2015-06-26 ENCOUNTER — Telehealth: Payer: Self-pay

## 2015-06-26 ENCOUNTER — Encounter: Payer: Self-pay | Admitting: Family

## 2015-06-26 VITALS — BP 122/70 | HR 84 | Temp 97.6°F

## 2015-06-26 DIAGNOSIS — R059 Cough, unspecified: Secondary | ICD-10-CM

## 2015-06-26 DIAGNOSIS — R197 Diarrhea, unspecified: Secondary | ICD-10-CM | POA: Diagnosis not present

## 2015-06-26 DIAGNOSIS — R7989 Other specified abnormal findings of blood chemistry: Secondary | ICD-10-CM

## 2015-06-26 DIAGNOSIS — R82998 Other abnormal findings in urine: Secondary | ICD-10-CM

## 2015-06-26 DIAGNOSIS — R8299 Other abnormal findings in urine: Secondary | ICD-10-CM

## 2015-06-26 DIAGNOSIS — D473 Essential (hemorrhagic) thrombocythemia: Secondary | ICD-10-CM

## 2015-06-26 DIAGNOSIS — I63312 Cerebral infarction due to thrombosis of left middle cerebral artery: Secondary | ICD-10-CM

## 2015-06-26 DIAGNOSIS — I639 Cerebral infarction, unspecified: Secondary | ICD-10-CM | POA: Diagnosis not present

## 2015-06-26 DIAGNOSIS — D75839 Thrombocytosis, unspecified: Secondary | ICD-10-CM

## 2015-06-26 DIAGNOSIS — R945 Abnormal results of liver function studies: Principal | ICD-10-CM

## 2015-06-26 DIAGNOSIS — R05 Cough: Secondary | ICD-10-CM

## 2015-06-26 LAB — CBC WITH DIFFERENTIAL/PLATELET
Basophils Absolute: 0 10*3/uL (ref 0.0–0.1)
Basophils Relative: 0.1 % (ref 0.0–3.0)
Eosinophils Absolute: 0.3 10*3/uL (ref 0.0–0.7)
Eosinophils Relative: 3.6 % (ref 0.0–5.0)
HCT: 42.7 % (ref 36.0–46.0)
Hemoglobin: 14 g/dL (ref 12.0–15.0)
Lymphocytes Relative: 14.5 % (ref 12.0–46.0)
Lymphs Abs: 1.3 10*3/uL (ref 0.7–4.0)
MCHC: 32.8 g/dL (ref 30.0–36.0)
MCV: 92.8 fl (ref 78.0–100.0)
Monocytes Absolute: 0.7 10*3/uL (ref 0.1–1.0)
Monocytes Relative: 8.4 % (ref 3.0–12.0)
Neutro Abs: 6.4 10*3/uL (ref 1.4–7.7)
Neutrophils Relative %: 73.4 % (ref 43.0–77.0)
Platelets: 875 10*3/uL — ABNORMAL HIGH (ref 150.0–400.0)
RBC: 4.6 Mil/uL (ref 3.87–5.11)
RDW: 17.1 % — ABNORMAL HIGH (ref 11.5–15.5)
WBC: 8.7 10*3/uL (ref 4.0–10.5)

## 2015-06-26 LAB — COMPREHENSIVE METABOLIC PANEL
ALT: 253 U/L — ABNORMAL HIGH (ref 0–35)
AST: 49 U/L — ABNORMAL HIGH (ref 0–37)
Albumin: 3.5 g/dL (ref 3.5–5.2)
Alkaline Phosphatase: 180 U/L — ABNORMAL HIGH (ref 39–117)
BUN: 11 mg/dL (ref 6–23)
CO2: 26 mEq/L (ref 19–32)
Calcium: 9.3 mg/dL (ref 8.4–10.5)
Chloride: 106 mEq/L (ref 96–112)
Creatinine, Ser: 0.3 mg/dL — ABNORMAL LOW (ref 0.40–1.20)
GFR: 274.69 mL/min (ref >60.00)
Glucose, Bld: 88 mg/dL (ref 70–99)
Potassium: 3.6 mEq/L (ref 3.5–5.1)
Sodium: 139 mEq/L (ref 135–145)
Total Bilirubin: 1.7 mg/dL — ABNORMAL HIGH (ref 0.2–1.2)
Total Protein: 6.6 g/dL (ref 6.0–8.3)

## 2015-06-26 LAB — LIPASE: Lipase: 20 U/L (ref 11.0–59.0)

## 2015-06-26 NOTE — Progress Notes (Signed)
Pre visit review using our clinic review tool, if applicable. No additional management support is needed unless otherwise documented below in the visit note. 

## 2015-06-26 NOTE — Telephone Encounter (Signed)
Faxed lab add on for Acute Hep Panel, Hep B surgace antibody, and Antic HCV per M. Arnett

## 2015-06-26 NOTE — Telephone Encounter (Signed)
Placed order for ultrasound of right upper quadrant due to elevated liver enzymes. Have also added hepatitis panel onto blood work done today.   Left message to call back. If patient's family calls back, please let them know about ultrasound

## 2015-06-26 NOTE — Progress Notes (Deleted)
Subjective:    Patient ID: Sandy Barrett, female    DOB: 06-19-1932, 82 y.o.   MRN: [id number]   Sandy Barrett is a 82 y.o. female who presents today for an acute visit.    HPI Comments: Patient here for evaluation of vomiting one time 5 days ago.  Since then has been dry heaving through yesterday, no vomiting. Non bloody.Accompanied by her daughter. Patient has severe aphasia and daughter does the talking.Daughter states 'much better today.'Has been eating and drinking well.  No fever, chills. No known food contacts.  Denies dysuria, no flank pain. Daughter describes darker appearing urine in her briefs,unsure if scant diarrhea. Daughter believes her mom has had some 'mild' diffuse abdominal pain, 'nothing severe as she would let us know that.' H/o IBS. No h/o diverticulitis.     Chief Complaint  Patient presents with  . Emesis    Patient was given botox in her arm for spacticity. Patient started having sx on Saturday and dtr states she vomited. Pt had dry heeze spells on Sunday and Monday. Dtr states that pt has been fatigued. Dtr also mentions that there is some debris noticed that in pt briefs however, they are not sure if it is urine, fecal or a mix of both. Pt admits that she is not having dysuria or other UTI sx.      Past Medical History  Diagnosis Date  . HYPERTENSION   . HYPERTHYROIDISM   . OBESITY   . ARTHRITIS   . Posttraumatic stress disorder   . INSOMNIA   . MIGRAINE HEADACHE   . Hyperthyroidism     s/p I-131 ablation 03/2011 of multinod goiter  . Arthritis   . Stroke [facility]) 03/2014    dysarthria  . Diverticulosis   . Pulmonary embolism ([place]) 03/2014    with DVT   Allergies: Review of patient's allergies indicates no known allergies. Current Outpatient Prescriptions on File Prior to Visit  Medication Sig Dispense Refill  . acetaminophen (TYLENOL) 325 MG tablet Take 1-2 tablets (325-650 mg total) by mouth every 4 (four) hours as needed for mild pain.    [name]  albuterol (PROVENTIL HFA) 108 (90 Base) MCG/ACT inhaler Inhale 2 puffs into the lungs every 6 (six) hours as needed for wheezing or shortness of breath. 1 Inhaler 1  . benzonatate (TESSALON PERLES) 100 MG capsule Take 1 capsule (100 mg total) by mouth 3 (three) times daily as needed for cough. 30 capsule 1  . diclofenac sodium (VOLTAREN) 1 % GEL APPLY (2GMS) TOPICALLY THREE TIMES DAILY. 300 g 3  . gabapentin (NEURONTIN) 100 MG capsule TAKE 1 CAPSULE (100 MG TOTAL) BY MOUTH 3 (THREE) TIMES DAILY. 90 capsule 5  . guaiFENesin-dextromethorphan (ROBITUSSIN DM) 100-10 MG/5ML syrup Take 5 mLs by mouth every 4 (four) hours as needed for cough. 118 mL 0  . methimazole (TAPAZOLE) 5 MG tablet TAKE 1 TABLET 3 TIMES A WEEK 12 tablet 2  . Multiple Vitamins-Minerals (DECUBI-VITE) CAPS Take 1 capsule by mouth daily. Take 1 capsule by mouth daily for wound healing    . nitrofurantoin, macrocrystal-monohydrate, (MACROBID) 100 MG capsule Take 1 capsule by mouth daily. Reported on 05/15/2015    . omeprazole (PRILOSEC) 20 MG capsule TAKE 1 CAPSULE (20 MG TOTAL) BY MOUTH DAILY. ON AN EMPTY STOMACH 90 capsule 1  . Probiotic Product (ALIGN) 4 MG CAPS Take 1 capsule by mouth daily.     . propranolol (INDERAL) 10 MG tablet Take 1 tablet (10 mg total) by mouth 2 (  two) times daily. 60 tablet 3  . senna-docusate (SENOKOT-S) 8.6-50 MG per tablet Take 2 tablets by mouth daily as needed for mild constipation.     [name] terconazole (TERAZOL 7) 0.4 % vaginal cream Place 1 applicator vaginally at bedtime. 90 g 3  . traMADol (ULTRAM) 50 MG tablet TAKE 1 TO 2 TABLETS BY MOUTH 3 TIMES DAILY AS NEEDED 180 tablet 1  . traZODone (DESYREL) 50 MG tablet TAKE 1 TABLET (50 MG TOTAL) BY MOUTH AT BEDTIME AS NEEDED FOR SLEEP. 30 tablet 5  . warfarin (COUMADIN) 4 MG tablet Take 6 mg (1 tablet) on Mon/Wed/Fri. And take 4 mg (1 1/2 tablets) on Sun/Tues/Thurs/Sat. Or as directed. 40 tablet 3   Current Facility-Administered Medications on File Prior to  Visit  Medication Dose Route Frequency Provider Last Rate Last Dose  . albuterol (PROVENTIL) (2.5 MG/3ML) 0.083% nebulizer solution 2.5 mg  2.5 mg Nebulization Once Burnard Hawthorne, FNP        Social History  Substance Use Topics  . Smoking status: Never Smoker   . Smokeless tobacco: Never Used  . Alcohol Use: No    Review of Systems  Constitutional: Negative for fever and chills.  Respiratory: Negative for cough.   Cardiovascular: Negative for chest pain and palpitations.  Gastrointestinal: Positive for nausea and vomiting. Negative for abdominal pain, diarrhea, constipation, blood in stool and abdominal distention.  Genitourinary: Negative for dysuria, frequency and flank pain.      Objective:    BP 122/70 mmHg  Pulse 84  Temp(Src) 97.6 F (36.4 C) (Oral)  SpO2 96%   Physical Exam  Constitutional: She appears well-developed and well-nourished.  Cardiovascular: Normal rate, regular rhythm, normal heart sounds and normal pulses.   Pulmonary/Chest: Effort normal and breath sounds normal. She has no wheezes. She has no rhonchi. She has no rales.  Abdominal: Soft. Normal appearance and bowel sounds are normal. She exhibits no distension, no fluid wave, no ascites and no mass. There is generalized tenderness. There is no rigidity, no rebound, no guarding, no CVA tenderness, no tenderness at McBurney's point and negative Murphy's sign.  Mild diffuse abdominal tenderness with deep palpation. Patient doesn't grimace; she starts talking when I palpate abdomen.No guarding or rebound.   Neurological: She is alert.  Skin: Skin is warm and dry.  Psychiatric: She has a normal mood and affect. Her speech is normal and behavior is normal. Thought content normal.  Vitals reviewed.      Assessment & Plan:   1. Diarrhea, unspecified type Resolved. Hemodynamically stable. Reassured the daughter said mom is acting more like herself, more talkative, and is eating and drinking well. Mild,  diffuse tenderness of abdomen; no focal tenderness to suggest acute abdomen. Pending lab work to evaluate for leukocytosis, electrolyte abnormality.   - Comprehensive metabolic panel; Future - CBC with Differential/Platelet; Future - Lipase; Future  2. Dark urine-per daughter Pending UA. - Urinalysis, Routine w reflex microscopic (not at [facility]); Future    I am having Ms. Lortie maintain her ALIGN, acetaminophen, DECUBI-VITE, senna-docusate, guaiFENesin-dextromethorphan, omeprazole, gabapentin, traZODone, nitrofurantoin (macrocrystal-monohydrate), propranolol, terconazole, warfarin, albuterol, benzonatate, diclofenac sodium, traMADol, and methimazole. We will continue to administer albuterol.   No orders of the defined types were placed in this encounter.     Start medications as prescribed and explained to patient on After Visit Summary ( AVS). Risks, benefits, and alternatives of the medications and treatment plan prescribed today were discussed, and patient expressed understanding.   Education regarding symptom management  and diagnosis given to patient.   Follow-up:Plan follow-up and return precautions given if any worsening symptoms or change in condition.   Continue to follow with [name], MD for routine health maintenance.   [name] Corman and I agreed with plan.   Mable Paris, FNP

## 2015-06-26 NOTE — Patient Instructions (Signed)
Happy you are doing better.  If abdominal pain recurs or worsens, please let us know.   Lab work downstairs.  If there is no improvement in your symptoms, or if there is any worsening of symptoms, or if you have any additional concerns, please return for re-evaluation; or, if we are closed, consider going to the Emergency Room for evaluation if symptoms urgent.  Viral Gastroenteritis Viral gastroenteritis is also known as stomach flu. This condition affects the stomach and intestinal tract. It can cause sudden diarrhea and vomiting. The illness typically lasts 3 to 8 days. Most people develop an immune response that eventually gets rid of the virus. While this natural response develops, the virus can make you quite ill. CAUSES  Many different viruses can cause gastroenteritis, such as rotavirus or noroviruses. You can catch one of these viruses by consuming contaminated food or water. You may also catch a virus by sharing utensils or other personal items with an infected person or by touching a contaminated surface. SYMPTOMS  The most common symptoms are diarrhea and vomiting. These problems can cause a severe loss of body fluids (dehydration) and a body salt (electrolyte) imbalance. Other symptoms may include:  Fever.  Headache.  Fatigue.  Abdominal pain. DIAGNOSIS  Your caregiver can usually diagnose viral gastroenteritis based on your symptoms and a physical exam. A stool sample may also be taken to test for the presence of viruses or other infections. TREATMENT  This illness typically goes away on its own. Treatments are aimed at rehydration. The most serious cases of viral gastroenteritis involve vomiting so severely that you are not able to keep fluids down. In these cases, fluids must be given through an intravenous line (IV). HOME CARE INSTRUCTIONS   Drink enough fluids to keep your urine clear or pale yellow. Drink small amounts of fluids frequently and increase the amounts as  tolerated.  Ask your caregiver for specific rehydration instructions.  Avoid:  Foods high in sugar.  Alcohol.  Carbonated drinks.  Tobacco.  Juice.  Caffeine drinks.  Extremely hot or cold fluids.  Fatty, greasy foods.  Too much intake of anything at one time.  Dairy products until 24 to 48 hours after diarrhea stops.  You may consume probiotics. Probiotics are active cultures of beneficial bacteria. They may lessen the amount and number of diarrheal stools in adults. Probiotics can be found in yogurt with active cultures and in supplements.  Wash your hands well to avoid spreading the virus.  Only take over-the-counter or prescription medicines for pain, discomfort, or fever as directed by your caregiver. Do not give aspirin to children. Antidiarrheal medicines are not recommended.  Ask your caregiver if you should continue to take your regular prescribed and over-the-counter medicines.  Keep all follow-up appointments as directed by your caregiver. SEEK IMMEDIATE MEDICAL CARE IF:   You are unable to keep fluids down.  You do not urinate at least once every 6 to 8 hours.  You develop shortness of breath.  You notice blood in your stool or vomit. This may look like coffee grounds.  You have abdominal pain that increases or is concentrated in one small area (localized).  You have persistent vomiting or diarrhea.  You have a fever.  The patient is a child younger than 3 months, and he or she has a fever.  The patient is a child older than 3 months, and he or she has a fever and persistent symptoms.  The patient is a child older than  3 months, and he or she has a fever and symptoms suddenly get worse.  The patient is a baby, and he or she has no tears when crying. MAKE SURE YOU:   Understand these instructions.  Will watch your condition.  Will get help right away if you are not doing well or get worse.   This information is not intended to replace  advice given to you by your health care provider. Make sure you discuss any questions you have with your health care provider.   Document Released: 12/22/2004 Document Revised: 03/16/2011 Document Reviewed: 10/08/2010 Elsevier Interactive Patient Education [employer].

## 2015-06-27 ENCOUNTER — Emergency Department (HOSPITAL_COMMUNITY)
Admission: EM | Admit: 2015-06-27 | Discharge: 2015-06-27 | Disposition: A | Discharge status: Home / Self Care | Payer: Medicare Other | Attending: Emergency Medicine | Admitting: Emergency Medicine

## 2015-06-27 ENCOUNTER — Telehealth: Payer: Self-pay | Admitting: Family

## 2015-06-27 ENCOUNTER — Encounter (HOSPITAL_COMMUNITY): Payer: Self-pay | Admitting: Emergency Medicine

## 2015-06-27 ENCOUNTER — Emergency Department (HOSPITAL_COMMUNITY): Payer: Medicare Other

## 2015-06-27 DIAGNOSIS — K5641 Fecal impaction: Secondary | ICD-10-CM | POA: Insufficient documentation

## 2015-06-27 DIAGNOSIS — K5900 Constipation, unspecified: Secondary | ICD-10-CM | POA: Diagnosis not present

## 2015-06-27 DIAGNOSIS — Z7901 Long term (current) use of anticoagulants: Secondary | ICD-10-CM | POA: Insufficient documentation

## 2015-06-27 DIAGNOSIS — I10 Essential (primary) hypertension: Secondary | ICD-10-CM | POA: Insufficient documentation

## 2015-06-27 DIAGNOSIS — N390 Urinary tract infection, site not specified: Secondary | ICD-10-CM | POA: Diagnosis not present

## 2015-06-27 DIAGNOSIS — Z96641 Presence of right artificial hip joint: Secondary | ICD-10-CM | POA: Insufficient documentation

## 2015-06-27 DIAGNOSIS — K5730 Diverticulosis of large intestine without perforation or abscess without bleeding: Secondary | ICD-10-CM | POA: Diagnosis not present

## 2015-06-27 DIAGNOSIS — R10819 Abdominal tenderness, unspecified site: Secondary | ICD-10-CM | POA: Diagnosis not present

## 2015-06-27 DIAGNOSIS — Z79899 Other long term (current) drug therapy: Secondary | ICD-10-CM | POA: Diagnosis not present

## 2015-06-27 DIAGNOSIS — K2970 Gastritis, unspecified, without bleeding: Secondary | ICD-10-CM | POA: Diagnosis not present

## 2015-06-27 DIAGNOSIS — Z8673 Personal history of transient ischemic attack (TIA), and cerebral infarction without residual deficits: Secondary | ICD-10-CM | POA: Diagnosis not present

## 2015-06-27 DIAGNOSIS — R109 Unspecified abdominal pain: Secondary | ICD-10-CM | POA: Diagnosis present

## 2015-06-27 DIAGNOSIS — R100 Acute abdomen: Secondary | ICD-10-CM | POA: Diagnosis not present

## 2015-06-27 LAB — CBC WITH DIFFERENTIAL/PLATELET
Basophils Absolute: 0.1 10*3/uL (ref 0.0–0.1)
Basophils Relative: 1 %
Eosinophils Absolute: 0.3 10*3/uL (ref 0.0–0.7)
Eosinophils Relative: 3 %
HCT: 42.1 % (ref 36.0–46.0)
Hemoglobin: 13.7 g/dL (ref 12.0–15.0)
Lymphocytes Relative: 16 %
Lymphs Abs: 1.3 10*3/uL (ref 0.7–4.0)
MCH: 29.7 pg (ref 26.0–34.0)
MCHC: 32.5 g/dL (ref 30.0–36.0)
MCV: 91.1 fL (ref 78.0–100.0)
Monocytes Absolute: 0.7 10*3/uL (ref 0.1–1.0)
Monocytes Relative: 8 %
Neutro Abs: 5.9 10*3/uL (ref 1.7–7.7)
Neutrophils Relative %: 72 %
Platelets: 729 10*3/uL — ABNORMAL HIGH (ref 150–400)
RBC: 4.62 MIL/uL (ref 3.87–5.11)
RDW: 16.7 % — ABNORMAL HIGH (ref 11.5–15.5)
WBC: 8.3 10*3/uL (ref 4.0–10.5)

## 2015-06-27 LAB — LIPASE, BLOOD: Lipase: 31 U/L (ref 11–51)

## 2015-06-27 LAB — URINE MICROSCOPIC-ADD ON: RBC / HPF: NONE SEEN RBC/hpf (ref 0–5)

## 2015-06-27 LAB — COMPREHENSIVE METABOLIC PANEL
ALT: 233 U/L — ABNORMAL HIGH (ref 14–54)
AST: 128 U/L — ABNORMAL HIGH (ref 15–41)
Albumin: 2.9 g/dL — ABNORMAL LOW (ref 3.5–5.0)
Alkaline Phosphatase: 193 U/L — ABNORMAL HIGH (ref 38–126)
Anion gap: 8 (ref 5–15)
BUN: 10 mg/dL (ref 6–20)
CO2: 23 mmol/L (ref 22–32)
Calcium: 9.1 mg/dL (ref 8.9–10.3)
Chloride: 108 mmol/L (ref 101–111)
Creatinine, Ser: 0.44 mg/dL (ref 0.44–1.00)
GFR calc Af Amer: >60 mL/min (ref >60)
GFR calc non Af Amer: >60 mL/min (ref >60)
Glucose, Bld: 85 mg/dL (ref 65–99)
Potassium: 3.9 mmol/L (ref 3.5–5.1)
Sodium: 139 mmol/L (ref 135–145)
Total Bilirubin: 2.8 mg/dL — ABNORMAL HIGH (ref 0.3–1.2)
Total Protein: 5.6 g/dL — ABNORMAL LOW (ref 6.5–8.1)

## 2015-06-27 LAB — URINALYSIS, ROUTINE W REFLEX MICROSCOPIC
Glucose, UA: NEGATIVE mg/dL
Hgb urine dipstick: NEGATIVE
Ketones, ur: 15 mg/dL — AB
Nitrite: POSITIVE — AB
Protein, ur: NEGATIVE mg/dL
Specific Gravity, Urine: 1.028 (ref 1.005–1.030)
pH: 5.5 (ref 5.0–8.0)

## 2015-06-27 MED ORDER — POLYETHYLENE GLYCOL 3350 17 G PO PACK: 17.0000 g | PACK | Freq: Two times a day (BID) | ORAL | Status: DC

## 2015-06-27 MED ORDER — FLEET ENEMA 7-19 GM/118ML RE ENEM
1.0000 | ENEMA | Freq: Once | RECTAL | Status: AC
  Administered 2015-06-27: 1 via RECTAL
  Filled 2015-06-27: qty 1

## 2015-06-27 MED ORDER — SODIUM CHLORIDE 0.9 % IV BOLUS (SEPSIS)
500.0000 mL | Freq: Once | INTRAVENOUS | Status: AC
  Administered 2015-06-27: 500 mL via INTRAVENOUS

## 2015-06-27 MED ORDER — CEPHALEXIN 500 MG PO CAPS: 500.0000 mg | ORAL_CAPSULE | Freq: Three times a day (TID) | ORAL | Status: DC

## 2015-06-27 MED ORDER — IOPAMIDOL (ISOVUE-300) INJECTION 61%
INTRAVENOUS | Status: AC
  Administered 2015-06-27: 75 mL
  Filled 2015-06-27: qty 100

## 2015-06-27 MED ORDER — DEXTROSE 5 % IV SOLN
1.0000 g | Freq: Once | INTRAVENOUS | Status: AC
  Administered 2015-06-27: 1 g via INTRAVENOUS
  Filled 2015-06-27: qty 10

## 2015-06-27 MED ORDER — ONDANSETRON HCL 4 MG/2ML IJ SOLN
4.0000 mg | Freq: Once | INTRAMUSCULAR | Status: AC
  Administered 2015-06-27: 4 mg via INTRAVENOUS
  Filled 2015-06-27: qty 2

## 2015-06-27 NOTE — Telephone Encounter (Signed)
Spoke with daughter while she sits the emergency room and her mother gets CT scan. Discussed lab results from yesterday. Canceled RUQ [place] as she is having CT.  She explained that the abdominal pain returned and worsened overnight and she decided to emergency room.   Per chart review, it appears ALT has improved very slightly however she does have a UTI per UA at hospital of which they were able to get with catheter. Daughter appreciated phone call and I let her know to call us if she needed anything.

## 2015-06-27 NOTE — Discharge Instructions (Signed)
Constipation, Adult Constipation is when a person has fewer than three bowel movements a week, has difficulty having a bowel movement, or has stools that are dry, hard, or larger than normal. As people grow older, constipation is more common. A low-fiber diet, not taking in enough fluids, and taking certain medicines may make constipation worse.  CAUSES   Certain medicines, such as antidepressants, pain medicine, iron supplements, antacids, and water pills.   Certain diseases, such as diabetes, irritable bowel syndrome (IBS), thyroid disease, or depression.   Not drinking enough water.   Not eating enough fiber-rich foods.   Stress or travel.   Lack of physical activity or exercise.   Ignoring the urge to have a bowel movement.   Using laxatives too much.  SIGNS AND SYMPTOMS   Having fewer than three bowel movements a week.   Straining to have a bowel movement.   Having stools that are hard, dry, or larger than normal.   Feeling full or bloated.   Pain in the lower abdomen.   Not feeling relief after having a bowel movement.  DIAGNOSIS  Your health care provider will take a medical history and perform a physical exam. Further testing may be done for severe constipation. Some tests may include:  A barium enema X-ray to examine your rectum, colon, and, sometimes, your small intestine.   A sigmoidoscopy to examine your lower colon.   A colonoscopy to examine your entire colon. TREATMENT  Treatment will depend on the severity of your constipation and what is causing it. Some dietary treatments include drinking more fluids and eating more fiber-rich foods. Lifestyle treatments may include regular exercise. If these diet and lifestyle recommendations do not help, your health care provider may recommend taking over-the-counter laxative medicines to help you have bowel movements. Prescription medicines may be prescribed if over-the-counter medicines do not work.    HOME CARE INSTRUCTIONS   Eat foods that have a lot of fiber, such as fruits, vegetables, whole grains, and beans.  Limit foods high in fat and processed sugars, such as french fries, hamburgers, cookies, candies, and soda.   A fiber supplement may be added to your diet if you cannot get enough fiber from foods.   Drink enough fluids to keep your urine clear or pale yellow.   Exercise regularly or as directed by your health care provider.   Go to the restroom when you have the urge to go. Do not hold it.   Only take over-the-counter or prescription medicines as directed by your health care provider. Do not take other medicines for constipation without talking to your health care provider first.  [place] IF:   You have bright red blood in your stool.   Your constipation lasts for more than 4 days or gets worse.   You have abdominal or rectal pain.   You have thin, pencil-like stools.   You have unexplained weight loss. MAKE SURE YOU:   Understand these instructions.  Will watch your condition.  Will get help right away if you are not doing well or get worse.   This information is not intended to replace advice given to you by your health care provider. Make sure you discuss any questions you have with your health care provider.   Document Released: 09/20/2003 Document Revised: 01/12/2014 Document Reviewed: 10/03/2012 Elsevier Interactive Patient Education 2016 Else[employer] Urinary Tract Infection Urinary tract infections (UTIs) can develop anywhere along your urinary tract. Your urinary tract is your body's  drainage system for removing wastes and extra water. Your urinary tract includes two kidneys, two ureters, a bladder, and a urethra. Your kidneys are a pair of bean-shaped organs. Each kidney is about the size of your fist. They are located below your ribs, one on each side of your spine. CAUSES Infections are caused by microbes, which are  microscopic organisms, including fungi, viruses, and bacteria. These organisms are so small that they can only be seen through a microscope. Bacteria are the microbes that most commonly cause UTIs. SYMPTOMS  Symptoms of UTIs may vary by age and gender of the patient and by the location of the infection. Symptoms in young women typically include a frequent and intense urge to urinate and a painful, burning feeling in the bladder or urethra during urination. Older women and men are more likely to be tired, shaky, and weak and have muscle aches and abdominal pain. A fever may mean the infection is in your kidneys. Other symptoms of a kidney infection include pain in your back or sides below the ribs, nausea, and vomiting. DIAGNOSIS To diagnose a UTI, your caregiver will ask you about your symptoms. Your caregiver will also ask you to provide a urine sample. The urine sample will be tested for bacteria and white blood cells. White blood cells are made by your body to help fight infection. TREATMENT  Typically, UTIs can be treated with medication. Because most UTIs are caused by a bacterial infection, they usually can be treated with the use of antibiotics. The choice of antibiotic and length of treatment depend on your symptoms and the type of bacteria causing your infection. HOME CARE INSTRUCTIONS  If you were prescribed antibiotics, take them exactly as your caregiver instructs you. Finish the medication even if you feel better after you have only taken some of the medication.  Drink enough water and fluids to keep your urine clear or pale yellow.  Avoid caffeine, tea, and carbonated beverages. They tend to irritate your bladder.  Empty your bladder often. Avoid holding urine for long periods of time.  Empty your bladder before and after sexual intercourse.  After a bowel movement, women should cleanse from front to back. Use each tissue only once. SEEK MEDICAL CARE IF:   You have back  pain.  You develop a fever.  Your symptoms do not begin to resolve within 3 days. SEEK IMMEDIATE MEDICAL CARE IF:   You have severe back pain or lower abdominal pain.  You develop chills.  You have nausea or vomiting.  You have continued burning or discomfort with urination. MAKE SURE YOU:   Understand these instructions.  Will watch your condition.  Will get help right away if you are not doing well or get worse.   This information is not intended to replace advice given to you by your health care provider. Make sure you discuss any questions you have with your health care provider.   Document Released: 10/01/2004 Document Revised: 09/12/2014 Document Reviewed: 01/30/2011 Elsevier Interactive Patient Education [employer].

## 2015-06-27 NOTE — ED Provider Notes (Signed)
CSN: [id number]     Arrival date & time 06/27/15  0404 History   First MD Initiated Contact with Patient 06/27/15 [phone]     Chief Complaint  Patient presents with  . Abdominal Pain  . Constipation  . Abnormal Lab    dark urine and elevated liver enzymes     (Consider location/radiation/quality/duration/timing/severity/associated sxs/prior Treatment) HPI  This is an 82 year old female with a history of hypertension, hypothyroidism, stroke causing dysarthria who presents with abdominal pain. History is gathered mostly from the patient's daughter. Patient has severe dysarthria and is difficult to understand. Daughter reports that she's had abdominal pain since Saturday. She had one episode of emesis on Saturday. Since then she is indicated that she has felt nauseous and been dry heaving. Daughter states that she had 3 liquidy bowel movements yesterday. She also noted dark urine in her adult diaper. No fevers.  Level V caveat for dysarthria  Past Medical History  Diagnosis Date  . HYPERTENSION   . HYPERTHYROIDISM   . OBESITY   . ARTHRITIS   . Posttraumatic stress disorder   . INSOMNIA   . MIGRAINE HEADACHE   . Hyperthyroidism     s/p I-131 ablation 03/2011 of multinod goiter  . Arthritis   . Stroke [facility]) 03/2014    dysarthria  . Diverticulosis   . Pulmonary embolism ([place]) 03/2014    with DVT   Past Surgical History  Procedure Laterality Date  . Abdominal hysterectomy  1976  . Total hip arthroplasty  1998     right  . Tonsillectomy    . Breast surgery      biopsy  . Radiology with anesthesia Left 03/08/2014    Procedure: RADIOLOGY WITH ANESTHESIA;  Surgeon: Rob Hickman, MD;  Location: [place];  Service: Radiology;  Laterality: Left;   Family History  Problem Relation Age of Onset  . Asthma Mother   . Asthma Father   . Prostate cancer Father   . Stroke Brother   . Thyroid disease Neg Hx    Social History  Substance Use Topics  . Smoking status: Never Smoker   .  Smokeless tobacco: Never Used  . Alcohol Use: No   OB History    No data available     Review of Systems  Unable to perform ROS: Other      Allergies  Review of patient's allergies indicates no known allergies.  Home Medications   Prior to Admission medications   Medication Sig Start Date End Date Taking? Authorizing Provider  acetaminophen (TYLENOL) 325 MG tablet Take 1-2 tablets (325-650 mg total) by mouth every 4 (four) hours as needed for mild pain. 04/05/14  Yes Ivan Anchors Love, PA-C  albuterol (PROVENTIL HFA) 108 (90 Base) MCG/ACT inhaler Inhale 2 puffs into the lungs every 6 (six) hours as needed for wheezing or shortness of breath. 05/15/15  Yes Burnard Hawthorne, FNP  benzonatate (TESSALON PERLES) 100 MG capsule Take 1 capsule (100 mg total) by mouth 3 (three) times daily as needed for cough. 05/15/15  Yes Burnard Hawthorne, FNP  diclofenac sodium (VOLTAREN) 1 % GEL APPLY (2GMS) TOPICALLY THREE TIMES DAILY. 06/11/15  Yes [name], MD  gabapentin (NEURONTIN) 100 MG capsule TAKE 1 CAPSULE (100 MG TOTAL) BY MOUTH 3 (THREE) TIMES DAILY. 02/04/15  Yes [name], MD  guaiFENesin-dextromethorphan (ROBITUSSIN DM) 100-10 MG/5ML syrup Take 5 mLs by mouth every 4 (four) hours as needed for cough. 12/07/14  Yes [name], MD  methimazole (TAPAZOLE) 5 MG tablet TAKE 1 TABLET 3 TIMES A WEEK 06/25/15  Yes Renato Shin, MD  Multiple Vitamins-Minerals (DECUBI-VITE) CAPS Take 1 capsule by mouth daily.    Yes Historical Provider, MD  nitrofurantoin, macrocrystal-monohydrate, (MACROBID) 100 MG capsule Take 0.5 capsules by mouth 2 (two) times daily. Reported on 05/15/2015 03/04/15  Yes Historical Provider, MD  omeprazole (PRILOSEC) 20 MG capsule TAKE 1 CAPSULE (20 MG TOTAL) BY MOUTH DAILY. ON AN EMPTY STOMACH 02/04/15  Yes [name], MD  Probiotic Product (ALIGN) 4 MG CAPS Take 1 capsule by mouth daily.    Yes Historical Provider, MD  propranolol (INDERAL) 10 MG tablet Take 1 tablet (10 mg  total) by mouth 2 (two) times daily. 04/12/15  Yes [name], MD  senna-docusate (SENOKOT-S) 8.6-50 MG per tablet Take 2 tablets by mouth daily as needed for mild constipation.    Yes Historical Provider, MD  terconazole (TERAZOL 7) 0.4 % vaginal cream Place 1 applicator vaginally at bedtime. 04/18/15  Yes Lavonia Drafts, MD  traMADol (ULTRAM) 50 MG tablet TAKE 1 TO 2 TABLETS BY MOUTH 3 TIMES DAILY AS NEEDED Patient taking differently: TAKE 1 TO 2 TABLETS BY MOUTH 3 TIMES DAILY AS NEEDED FOR PAIN 06/13/15  Yes [name], MD  traZODone (DESYREL) 50 MG tablet TAKE 1 TABLET (50 MG TOTAL) BY MOUTH AT BEDTIME AS NEEDED FOR SLEEP. 02/04/15  Yes [name], MD  warfarin (COUMADIN) 4 MG tablet Take 6 mg (1 tablet) on Mon/Wed/Fri. And take 4 mg (1 1/2 tablets) on Sun/Tues/Thurs/Sat. Or as directed. Patient taking differently: Take 4-6 mg by mouth daily. Take 6 mg on Mon/Wed/Fri/Sat. And take 4 mg on Sun/Tues/Thurs/. Or as directed. 04/29/15  Yes [name], MD  cephALEXin (KEFLEX) 500 MG capsule Take 1 capsule (500 mg total) by mouth 3 (three) times daily. 06/27/15   Merryl Hacker, MD  polyethylene glycol (MIRALAX) packet Take 17 g by mouth 2 (two) times daily. 06/27/15   Merryl Hacker, MD   BP 126/69 mmHg  Pulse 76  Temp(Src) 98.3 F (36.8 C) (Oral)  Resp 16  SpO2 97% Physical Exam  Constitutional: She is oriented to person, place, and time. No distress.  Elderly  HENT:  Head: Normocephalic and atraumatic.  Eyes: Pupils are equal, round, and reactive to light.  Cardiovascular: Normal rate, regular rhythm and normal heart sounds.   Pulmonary/Chest: Effort normal. No respiratory distress. She has no wheezes.  Abdominal: Soft. Bowel sounds are normal. There is tenderness. There is no rebound and no guarding.  Diffuse tenderness to palpation without rebound or guarding  Genitourinary:  Normal rectal tone, soft stool in the rectal vault  Neurological: She is alert and oriented to  person, place, and time.  Dysarthria, right-sided weakness at baseline  Skin: Skin is warm and dry.  Stage I sacral decubitus  Psychiatric: She has a normal mood and affect.  Nursing note and vitals reviewed.   ED Course  Procedures (including critical care time) Labs Review Labs Reviewed  CBC WITH DIFFERENTIAL/PLATELET - Abnormal; Notable for the following:    RDW 16.7 (*)    Platelets 729 (*)    All other components within normal limits  COMPREHENSIVE METABOLIC PANEL - Abnormal; Notable for the following:    Total Protein 5.6 (*)    Albumin 2.9 (*)    AST 128 (*)    ALT 233 (*)    Alkaline Phosphatase 193 (*)    Total Bilirubin  2.8 (*)    All other components within normal limits  URINALYSIS, ROUTINE W REFLEX MICROSCOPIC (NOT AT [facility]) - Abnormal; Notable for the following:    Color, Urine ORANGE (*)    Bilirubin Urine SMALL (*)    Ketones, ur 15 (*)    Nitrite POSITIVE (*)    Leukocytes, UA MODERATE (*)    All other components within normal limits  URINE MICROSCOPIC-ADD ON - Abnormal; Notable for the following:    Squamous Epithelial / LPF 0-5 (*)    Bacteria, UA RARE (*)    Crystals CA OXALATE CRYSTALS (*)    All other components within normal limits  URINE CULTURE  LIPASE, BLOOD    Imaging Review Ct Abdomen Pelvis W Contrast  06/27/2015  CLINICAL DATA:  Lower abdominal pain. EXAM: CT ABDOMEN AND PELVIS WITH CONTRAST TECHNIQUE: Multidetector CT imaging of the abdomen and pelvis was performed using the standard protocol following bolus administration of intravenous contrast. CONTRAST:  78mL ISOVUE-300 IOPAMIDOL (ISOVUE-300) INJECTION 61% COMPARISON:  CT scan of December 25, 2014. FINDINGS: Stable old compression fracture of L4 vertebral body is noted. Visualized lung bases are unremarkable. Solitary gallstone is noted. The liver, spleen and pancreas unremarkable. Adrenal glands appear normal. Stable bilateral parapelvic cysts are noted. No hydronephrosis or renal  obstruction is noted. Atherosclerosis of abdominal aorta is noted. There is no evidence of bowel obstruction. No abnormal fluid collection is noted. Urinary bladder appears normal. Sigmoid diverticulosis is noted without inflammation. Large amount of stool is noted in the rectum concerning for impaction. Status post right hip arthroplasty. No significant osseous abnormality is noted. Stable large ventral hernia is noted in pelvis containing loops of small bowel, but no incarceration or obstruction is noted. Collateral veins are noted in the anterior abdominal wall and left side of the chest. Status post hysterectomy. Ovaries are not well visualized. There remains soft tissue stranding in the visualized portion of the right breast. IMPRESSION: Aortic atherosclerosis. Solitary gallstone without evidence of cholecystitis. Sigmoid diverticulosis without inflammation. Large amount of stool seen in rectum concerning for impaction. Stable large ventral hernia containing small bowel loops is noted in the pelvis, without evidence of incarceration or obstruction. Stable soft tissue stranding is noted in the right breast suggesting edema or inflammation. Collateral veins are noted in the anterior abdominal wall left side of the chest which are stable. Electronically Signed   By: Marijo Conception, M.D.   On: 06/27/2015 08:12   I have personally reviewed and evaluated these images and lab results as part of my medical decision-making.   EKG Interpretation None      MDM   Final diagnoses:  Fecal impaction ([place])  UTI (lower urinary tract infection)    Patient presents with abdominal pain. Difficult to obtain history from patient secondary to dysarthria. She is largely nontoxic. Abdomen is tender but no signs of peritonitis. Daughter reports lab work at primary physician with elevated LFTs. Lab work was reordered. She does have mildly elevated LFTs and bilirubin. She also has evidence of urinary tract infection. Last  culture grew out Klebsiella susceptible to cephalosporins.  We'll give 1 g of Rocephin. Urine culture pending. Giving lack of specifics on history, will obtain a CT scan of the abdomen to rule out intra-abdominal emergency.  CT scan shows a large amount of stool in the rectum concerning for impaction. Stool is soft ear patient was given an enema. There are also multiple other findings including a solitary gallstone without cholecystitis, sigmoid diverticulosis,  ventral hernia and soft tissue swelling around the right breast. Patient will follow-up with her primary physician. Discharge home with MiraLAX and Keflex for UTI.  After history, exam, and medical workup I feel the patient has been appropriately medically screened and is safe for discharge home. Pertinent diagnoses were discussed with the patient. Patient was given return precautions.     Merryl Hacker, MD 06/27/15 [phone]

## 2015-06-27 NOTE — Telephone Encounter (Signed)
Hope called back and there is not enough for hepatitis panel add on.

## 2015-06-27 NOTE — ED Notes (Signed)
Pt presents today from home with [facility] for lower abd pain with dark urine and possible constipation, elevated liver enzymes- per MD, and recent gastroenteritis diagnosis

## 2015-06-27 NOTE — Telephone Encounter (Signed)
Called lab spoke to [facility] and she is looking to see if there is enough of a blood sample and/or the right tube drawn to do the add on hepatitis panel.  Faxed lab add on sheet to the lab. Used dx R74.8 (elevated liver enzymes).

## 2015-06-29 NOTE — Telephone Encounter (Signed)
Can we call patient and schedule f/u to ED visit for elevated LFTs bilirubin?   We can draw more labs at f/u and tease out etiology or place needed referrals.   Thanks.

## 2015-07-01 ENCOUNTER — Emergency Department (HOSPITAL_COMMUNITY): Payer: Medicare Other

## 2015-07-01 ENCOUNTER — Emergency Department (HOSPITAL_COMMUNITY)
Admission: EM | Admit: 2015-07-01 | Discharge: 2015-07-02 | Disposition: A | Discharge status: Home / Self Care | Payer: Medicare Other | Attending: Emergency Medicine | Admitting: Emergency Medicine

## 2015-07-01 ENCOUNTER — Telehealth: Payer: Self-pay

## 2015-07-01 ENCOUNTER — Encounter (HOSPITAL_COMMUNITY): Payer: Self-pay | Admitting: Emergency Medicine

## 2015-07-01 DIAGNOSIS — Z79899 Other long term (current) drug therapy: Secondary | ICD-10-CM | POA: Diagnosis not present

## 2015-07-01 DIAGNOSIS — E0590 Thyrotoxicosis, unspecified without thyrotoxic crisis or storm: Secondary | ICD-10-CM | POA: Insufficient documentation

## 2015-07-01 DIAGNOSIS — M1990 Unspecified osteoarthritis, unspecified site: Secondary | ICD-10-CM | POA: Insufficient documentation

## 2015-07-01 DIAGNOSIS — Z8673 Personal history of transient ischemic attack (TIA), and cerebral infarction without residual deficits: Secondary | ICD-10-CM | POA: Diagnosis not present

## 2015-07-01 DIAGNOSIS — I10 Essential (primary) hypertension: Secondary | ICD-10-CM | POA: Insufficient documentation

## 2015-07-01 DIAGNOSIS — Z7951 Long term (current) use of inhaled steroids: Secondary | ICD-10-CM | POA: Diagnosis not present

## 2015-07-01 DIAGNOSIS — I871 Compression of vein: Secondary | ICD-10-CM | POA: Diagnosis not present

## 2015-07-01 DIAGNOSIS — K5900 Constipation, unspecified: Secondary | ICD-10-CM | POA: Insufficient documentation

## 2015-07-01 DIAGNOSIS — E049 Nontoxic goiter, unspecified: Secondary | ICD-10-CM | POA: Diagnosis not present

## 2015-07-01 DIAGNOSIS — Z791 Long term (current) use of non-steroidal anti-inflammatories (NSAID): Secondary | ICD-10-CM | POA: Diagnosis not present

## 2015-07-01 DIAGNOSIS — Z792 Long term (current) use of antibiotics: Secondary | ICD-10-CM | POA: Diagnosis not present

## 2015-07-01 DIAGNOSIS — R1084 Generalized abdominal pain: Secondary | ICD-10-CM | POA: Diagnosis not present

## 2015-07-01 DIAGNOSIS — N390 Urinary tract infection, site not specified: Secondary | ICD-10-CM | POA: Diagnosis present

## 2015-07-01 DIAGNOSIS — Z7901 Long term (current) use of anticoagulants: Secondary | ICD-10-CM | POA: Insufficient documentation

## 2015-07-01 DIAGNOSIS — E041 Nontoxic single thyroid nodule: Secondary | ICD-10-CM | POA: Insufficient documentation

## 2015-07-01 LAB — URINALYSIS, ROUTINE W REFLEX MICROSCOPIC
Bilirubin Urine: NEGATIVE
Glucose, UA: NEGATIVE mg/dL
Hgb urine dipstick: NEGATIVE
Ketones, ur: NEGATIVE mg/dL
Nitrite: POSITIVE — AB
Protein, ur: NEGATIVE mg/dL
Specific Gravity, Urine: 1.019 (ref 1.005–1.030)
pH: 6.5 (ref 5.0–8.0)

## 2015-07-01 LAB — URINE CULTURE: Culture: >=100000 — AB

## 2015-07-01 LAB — URINE MICROSCOPIC-ADD ON: RBC / HPF: NONE SEEN RBC/hpf (ref 0–5)

## 2015-07-01 MED ORDER — IOPAMIDOL (ISOVUE-300) INJECTION 61%: 100.0000 mL | Freq: Once | INTRAVENOUS | Status: DC | PRN

## 2015-07-01 NOTE — Telephone Encounter (Signed)
Called pt dtr and spoke to both [place]. They stated that pt neck is swollen on the right side more than the left and that they could see the streaking in the vessels of her neck. I informed that pt should be taken to the ED and that what they are describing is sepsis or reaction to the abx that pt has been rxed. Dtr agree to comply with advise.

## 2015-07-01 NOTE — Telephone Encounter (Signed)
Patients daughter called and said that her mother neck is swollen.. And she doesn't know if it has anything to do with last weeks visit or if it is something totally different. Please advise or follow up. Thank you.

## 2015-07-01 NOTE — Telephone Encounter (Signed)
Agree - in ED currently.

## 2015-07-01 NOTE — Telephone Encounter (Signed)
Spoke with pts daughter. Pt came to see Joycelyn Schmid last week and also went to the ED. Pts daughter says that the Miralax is not helping. Also states that the pts neck is swelling at the base on ride side. Pt does not seem to be in any discomfort for neck. Pt is lethargic and not as active as normal . Please advise.

## 2015-07-01 NOTE — Telephone Encounter (Signed)
Pt dtrs have been advised to go back to ED. Please see additional phone note.

## 2015-07-01 NOTE — ED Provider Notes (Signed)
CSN: [id number]     Arrival date & time 07/01/15  2022 History   First MD Initiated Contact with Patient 07/01/15 2030     Chief Complaint  Patient presents with  . Urinary Tract Infection  . Constipation     (Consider location/radiation/quality/duration/timing/severity/associated sxs/prior Treatment) HPI Comments: Level 5 caveat secondary to dysarthria. Daughter's at bedside providing history.  82 year old female with a history of stroke and residual right-sided weakness and dysarthria, HTN, hypothyroidism presents to the ED for concern over lack of a bowel movement as well as some right sided neck swelling. Patient was seen and evaluated on 06/27/2015. She was diagnosed with a fecal impaction and received an enema with moderate passage of stool. Patient was started on MiraLAX at this time. Daughters state that patient has not had a normal bowel movement since this ED visit. They have been compliant with MiraLAX and report that the patient has had a good appetite. The patient also was diagnosed with a urinary tract infection at this time. She has been taking Keflex as prescribed.   Daughter reports that she noticed some swelling to the vasculature on the right side of the patient's neck. This has spontaneously improved as the day has progressed. Family called the patient's doctor who expressed concern over possible sepsis as the cause of patient's neck swelling. Per telephone communication today documented by the medical assistant "(Daughters) stated that pt neck is swollen on the right side more than the left and that they could see the streaking in the vessels of her neck. I informed that pt should be taken to the ED and that what they are describing is sepsis or reaction to the abx that pt has been rxed". Daughters deny any fevers PTA. No vomiting since last evaluated in the ED. No reported shortness of breath or cough. They state that the patient has not appeared to be in any pain.  Patient is  a 82 y.o. female presenting with urinary tract infection and constipation. The history is provided by a relative. No language interpreter was used.  Urinary Tract Infection Constipation   Past Medical History  Diagnosis Date  . HYPERTENSION   . HYPERTHYROIDISM   . OBESITY   . ARTHRITIS   . Posttraumatic stress disorder   . INSOMNIA   . MIGRAINE HEADACHE   . Hyperthyroidism     s/p I-131 ablation 03/2011 of multinod goiter  . Arthritis   . Stroke [facility]) 03/2014    dysarthria  . Diverticulosis   . Pulmonary embolism ([place]) 03/2014    with DVT   Past Surgical History  Procedure Laterality Date  . Abdominal hysterectomy  1976  . Total hip arthroplasty  1998     right  . Tonsillectomy    . Breast surgery      biopsy  . Radiology with anesthesia Left 03/08/2014    Procedure: RADIOLOGY WITH ANESTHESIA;  Surgeon: Rob Hickman, MD;  Location: [facility];  Service: Radiology;  Laterality: Left;   Family History  Problem Relation Age of Onset  . Asthma Mother   . Asthma Father   . Prostate cancer Father   . Stroke Brother   . Thyroid disease Neg Hx    Social History  Substance Use Topics  . Smoking status: Never Smoker   . Smokeless tobacco: Never Used  . Alcohol Use: No   OB History    No data available      Review of Systems  Unable to perform ROS: Other  Gastrointestinal: Positive for constipation.  Musculoskeletal: Negative for neck stiffness.    Allergies  Review of patient's allergies indicates no known allergies.  Home Medications   Prior to Admission medications   Medication Sig Start Date End Date Taking? Authorizing Provider  acetaminophen (TYLENOL) 325 MG tablet Take 1-2 tablets (325-650 mg total) by mouth every 4 (four) hours as needed for mild pain. 04/05/14  Yes Ivan Anchors Love, PA-C  albuterol (PROVENTIL HFA) 108 (90 Base) MCG/ACT inhaler Inhale 2 puffs into the lungs every 6 (six) hours as needed for wheezing or shortness of breath. 05/15/15  Yes  Burnard Hawthorne, FNP  benzonatate (TESSALON PERLES) 100 MG capsule Take 1 capsule (100 mg total) by mouth 3 (three) times daily as needed for cough. 05/15/15  Yes Burnard Hawthorne, FNP  cephALEXin (KEFLEX) 500 MG capsule Take 1 capsule (500 mg total) by mouth 3 (three) times daily. 06/27/15  Yes Merryl Hacker, MD  diclofenac sodium (VOLTAREN) 1 % GEL APPLY (2GMS) TOPICALLY THREE TIMES DAILY. Patient taking differently: APPLY (2GMS) TOPICALLY THREE TIMES DAILY AS NEEDED FOR PAIN 06/11/15  Yes [name], MD  gabapentin (NEURONTIN) 100 MG capsule TAKE 1 CAPSULE (100 MG TOTAL) BY MOUTH 3 (THREE) TIMES DAILY. 02/04/15  Yes [name], MD  guaiFENesin-dextromethorphan (ROBITUSSIN DM) 100-10 MG/5ML syrup Take 5 mLs by mouth every 4 (four) hours as needed for cough. 12/07/14  Yes [name], MD  methimazole (TAPAZOLE) 5 MG tablet TAKE 1 TABLET 3 TIMES A WEEK 06/25/15  Yes Renato Shin, MD  Multiple Vitamins-Minerals (DECUBI-VITE) CAPS Take 1 capsule by mouth daily.    Yes Historical Provider, MD  nitrofurantoin, macrocrystal-monohydrate, (MACROBID) 100 MG capsule Take 0.5 capsules by mouth 2 (two) times daily. Reported on 05/15/2015 03/04/15  Yes Historical Provider, MD  omeprazole (PRILOSEC) 20 MG capsule TAKE 1 CAPSULE (20 MG TOTAL) BY MOUTH DAILY. ON AN EMPTY STOMACH 02/04/15  Yes [name], MD  polyethylene glycol [facility]) packet Take 17 g by mouth 2 (two) times daily. 06/27/15  Yes Merryl Hacker, MD  Probiotic Product (ALIGN) 4 MG CAPS Take 1 capsule by mouth daily.    Yes Historical Provider, MD  propranolol (INDERAL) 10 MG tablet Take 1 tablet (10 mg total) by mouth 2 (two) times daily. 04/12/15  Yes [name], MD  senna-docusate (SENOKOT-S) 8.6-50 MG per tablet Take 2 tablets by mouth daily as needed for mild constipation.    Yes Historical Provider, MD  terconazole (TERAZOL 7) 0.4 % vaginal cream Place 1 applicator vaginally at bedtime. 04/18/15  Yes Lavonia Drafts, MD   traMADol (ULTRAM) 50 MG tablet TAKE 1 TO 2 TABLETS BY MOUTH 3 TIMES DAILY AS NEEDED Patient taking differently: TAKE 1 TO 2 TABLETS BY MOUTH 3 TIMES DAILY AS NEEDED FOR PAIN 06/13/15  Yes [name], MD  traZODone (DESYREL) 50 MG tablet TAKE 1 TABLET (50 MG TOTAL) BY MOUTH AT BEDTIME AS NEEDED FOR SLEEP. 02/04/15  Yes [name], MD  warfarin (COUMADIN) 4 MG tablet Take 6 mg (1 tablet) on Mon/Wed/Fri. And take 4 mg (1 1/2 tablets) on Sun/Tues/Thurs/Sat. Or as directed. Patient taking differently: Take 4-6 mg by mouth daily. Take 6 mg on Mon/Wed/Fri/Sat. And take 4 mg on Sun/Tues/Thurs/. Or as directed. 04/29/15  Yes [name], MD   BP 117/78 mmHg  Pulse 80  Temp(Src) 97.9 F (36.6 C) (Oral)  Resp 18  SpO2 98%   Physical Exam  Constitutional: She  is oriented to person, place, and time. She appears well-developed and well-nourished. No distress.  Nontoxic appearing and in no distress.  HENT:  Head: Normocephalic and atraumatic.  Eyes: Conjunctivae and EOM are normal. No scleral icterus.  Neck: Normal range of motion.  Cardiovascular: Normal rate, regular rhythm and intact distal pulses.   Pulmonary/Chest: Effort normal and breath sounds normal. No respiratory distress. She has no wheezes. She has no rales.  Lungs grossly clear. Respirations even and unlabored. SpO2 85-88%; this is the patient's baseline, per family.  Abdominal: Soft. She exhibits no distension. There is no tenderness. There is no rebound.  Soft, nontender abdomen. No masses. No rigidity or guarding.  Musculoskeletal: Normal range of motion.  Neurological: She is alert and oriented to person, place, and time.  Residual R sided deficits. Dysarthria. Alert.  Skin: Skin is warm and dry. No rash noted. She is not diaphoretic. No erythema. No pallor.  Psychiatric: She has a normal mood and affect. Her behavior is normal.  Nursing note and vitals reviewed.   ED Course  Procedures (including critical care time) Labs  Review Labs Reviewed  URINALYSIS, ROUTINE W REFLEX MICROSCOPIC (NOT AT [facility]) - Abnormal; Notable for the following:    Color, Urine ORANGE (*)    Nitrite POSITIVE (*)    Leukocytes, UA TRACE (*)    All other components within normal limits  URINE MICROSCOPIC-ADD ON - Abnormal; Notable for the following:    Squamous Epithelial / LPF 0-5 (*)    Bacteria, UA RARE (*)    All other components within normal limits    Imaging Review Ct Soft Tissue Neck Wo Contrast  07/02/2015  CLINICAL DATA:  Possible neck veins swelling, now improving. History of stroke, RIGHT-sided weakness, aphasia. Recent bowel obstruction and urinary tract infection, constipation. EXAM: CT NECK WITHOUT CONTRAST TECHNIQUE: Multidetector CT imaging of the neck was performed following the standard protocol without intravenous contrast. COMPARISON:  MRI brain March 20, 2015 and CT chest May 31, 2015 FINDINGS: Pharynx and larynx: Pharynx is normal. Larynx and proximal trachea displaced to the RIGHT due to thyroid goiter. Salivary glands: Normal. Thyroid: Markedly enlarged heterogeneous thyroid with multiple nodules and, scattered calcifications, substernal extent displaces the trachea to the RIGHT, effaces the superior vena cava. Lymph nodes: No definite lymphadenopathy though limited by varicosities in noncontrast examination. Vascular: Calcification along the course the LEFT internal jugular vein. Limited intracranial: LEFT temporal lobe encephalomalacia corresponding to known old LEFT MCA territory infarct. Visualized orbits: Normal. Mastoids and visualized paranasal sinuses: Trace paranasal sinus mucosal thickening without air-fluid levels. Prominent in size foramen. Mastoid air cells are well aerated. Skeleton: Multiple subcentimeter calcifications LEFT infra spinatus muscle most consistent with phleboliths responding to non mass. No destructive bony lesions. Upper chest: Findings are stable from prior CT chest. Varicosities within the  chest wall, and proximal neck. IMPRESSION: Severe, stable thyromegaly corresponding to known multi nodular goiter. Given extensive varicosities throughout the chest and neck, this is most compatible with chronic venous obstruction/SVC syndrome. Calcifications LEFT internal jugular vein most compatible with chronic thrombosis. No acute process in the neck.  Patent airway. Electronically Signed   By: Elon Alas M.D.   On: 07/02/2015 00:12     I have personally reviewed and evaluated these images and lab results as part of my medical decision-making.   EKG Interpretation None      MDM   Final diagnoses:  Venous obstruction  Goiter    82 year old female presents to the emergency department  for evaluation of right-sided neck swelling which was noticed by her daughters this AM. The patient was recently seen 4 days ago for fecal impaction and urinary tract infection. Urinary tract infection appears to be resolving on repeat urinalysis. Chaperoned digital rectal exam performed by my attending, Dr. Wilson Singer who denies any stool in the rectal vault. Patient has been taking MiraLAX since discharge and she has had a good appetite without emesis. No concern for obstruction today and a reassuring CT scan 4 days ago.  Family requesting imaging to evaluate for cause of neck swelling. On CT of the neck it appears as though her neck swelling is due to extensive varicosities in the chest and neck consistent with chronic venous obstruction/SVC. On further evaluation, this seems to be due to the patient's known goiter. This is chronic to the patient and patient is in no evidence of pain. Her SpO2 sats are at baseline, per family. The patient exhibits no signs of SOB. She is afebrile with stable vital signs. I do not believe that she requires further inpatient management. Patient stable for discharge with instruction to follow up with her PCP. Return precautions given at discharge. Patient discharged via [facility] in  satisfactory condition.   Filed Vitals:   07/01/15 2044 07/01/15 2252 07/02/15 0124  BP: 123/68 117/78 133/76  Pulse:  80 79  Temp: 97.9 F (36.6 C)    TempSrc: Oral    Resp: 18 18 16   SpO2: 87% 98% 95%     Antonietta Breach, PA-C 07/02/15 [id number]  Virgel Manifold, MD 07/02/15 1044

## 2015-07-01 NOTE — ED Notes (Signed)
Bed: WA09 Expected date:  Expected time:  Means of arrival:  Comments: 82 yr old UTI, neck pain

## 2015-07-01 NOTE — ED Notes (Signed)
Patient Brought in by EMS from home. Patient has hx of stroke and rt sided weakness and aphasia. Family concerned bc patient was seen [facility] week for a bowel obstruction and UTI, patient given miralax as directed but has still gone 4 days w/o BM. EMS also reports family called PCP for concern of neck vein swelling. MD told family to have patient evaluated for possible signs of sepsis. EMS reports family states neck swelling has improved since noticing it this morning.

## 2015-07-02 ENCOUNTER — Telehealth (HOSPITAL_BASED_OUTPATIENT_CLINIC_OR_DEPARTMENT_OTHER): Payer: Self-pay | Admitting: Emergency Medicine

## 2015-07-02 DIAGNOSIS — Q892 Congenital malformations of other endocrine glands: Secondary | ICD-10-CM | POA: Diagnosis not present

## 2015-07-02 NOTE — Telephone Encounter (Signed)
I see she went to ED.  We need to follow her labs; can we make a f/u appt with her pcp ,myself, or another provider at [facility] ??

## 2015-07-02 NOTE — Telephone Encounter (Signed)
Also, I have pended Hepatitis labs so she can come at any time for this.   Can we check on status on RUQ [place] that I ordered as well? I don't see this scheduled

## 2015-07-02 NOTE — Discharge Instructions (Signed)
Goiter A goiter is an enlarged thyroid gland. The thyroid gland is located in the lower front of the neck. The gland produces hormones that regulate mood, body temperature, pulse rate, and digestion. Most goiters are painless and are not a cause for serious concern. Goiters and conditions that cause goiters can be treated, if necessary. CAUSES Causes of this condition include:  Diseases that attack healthy cells in your body (autoimmune diseases) and affect your thyroid function, such as:  Graves disease. This causes too much thyroid hormone to be produced and it makes your thyroid overly active (hyperthyroidism).  Hashimoto disease. This type of inflammation of the thyroid (thyroiditis) causes too little thyroid hormone to be produced and it makes your thyroid not active enough (hypothyroidism).  Other conditions that cause thyroiditis.  Nodular goiter. This means that there are one or more small growths on your thyroid. These can create too much thyroid hormone.  Pregnancy.  Thyroid cancer. This is rare.  Certain medicines.  Radiation exposure.  Iodine deficiency. In some cases, the cause may not be known (idiopathic). RISK FACTORS This condition is more likely to develop in:  People who have a family history of goiter.  Women.  People who do not get enough iodine in their diet.  People who are older than 75.  People who smoke tobacco. SYMPTOMS Common symptoms of this condition include:  Swelling in the lower part of the neck. This swelling can range from a very small bump to a large lump.  A tight feeling in the throat.  A hoarse voice. Other symptoms include:   Coughing.  Wheezing.  Difficulty swallowing.  Difficulty breathing.  Bulging neck veins.  Dizziness. In some cases, there are no symptoms and thyroid hormone levels may be normal. When a goiter is the result of hyperthyroidism, symptoms may also include:  Nervousness or  restlessness.  Inability to tolerate heat.  Unexplained weight loss.  Diarrhea.  Change in the texture of hair or skin.  Changes in heart beat, such as skipped beats, extra beats, or a rapid heart rate.  Loss of menstruation.  Shaky hands.  Increased appetite.  Sleep problems. When a goiter is the result of hypothyroidism, symptoms may also include:  Feeling like you have no energy (lethargy).  Inability to tolerate cold.  Weight gain that is not explained by a change in diet or exercise habits.  Dry skin.  Coarse hair.  Menstrual irregularity.  Constipation.  Sadness or depression. DIAGNOSIS This condition may be diagnosed with a medical history and physical exam. You may also have other tests, including:  Blood tests to check thyroid function.  Imaging tests, such as:  Ultrasonography.  CT scan.  MRI.  Thyroid scan. You will be given a safe radioactive injection, then images will be taken of your thyroid.  Tissue sample (biopsy) of the goiter or any nodules. This checks to see if the goiter or nodules are cancerous. TREATMENT Treatment for this condition depends on the cause. Treatment may include:  Medicines to control your thyroid.  Anti-inflammatory or steroid medicines, if inflammation is the cause.  Iodine supplements or changes in diet, if the goiter is caused by iodine deficiency.  Radiation therapy.  Surgery to remove your thyroid. In some cases, no treatment is necessary, and your health care provider will monitor your condition at regular checkups. HOME CARE INSTRUCTIONS  Follow recommendations from your health care provider for any changes to your diet.  Take over-the-counter and prescription medicines only as told by  your health care provider.  Do not use any tobacco products, including cigarettes, chewing tobacco, or e-cigarettes. If you need help quitting, ask your health care provider.  Keep all follow-up appointments as told  by your health care provider. This is important. SEEK MEDICAL CARE IF:  Your symptoms do not get better with treatment. SEEK IMMEDIATE MEDICAL CARE IF:  You develop sudden, unexplained confusion or other mental changes.  You have nausea, vomiting, or diarrhea.  You develop a fever.  Your skin or the whites of your eyes appear yellow (jaundice).  You develop chest pain.  You have trouble breathing or swallowing.  You suddenly become very weak.  You experience extreme restlessness.   This information is not intended to replace advice given to you by your health care provider. Make sure you discuss any questions you have with your health care provider.   Document Released: 06/11/2009 Document Revised: 05/08/2014 Document Reviewed: 12/18/2013 Elsevier Interactive Patient Education [employer].

## 2015-07-02 NOTE — Progress Notes (Signed)
ED Antimicrobial Stewardship Positive Culture Follow Up   Sandy Barrett is an 82 y.o. female who presented to [facility] on 07/01/2015 with a chief complaint of  Chief Complaint  Patient presents with  . Urinary Tract Infection  . Constipation    Recent Results (from the past 720 hour(s))  Urine culture     Status: Abnormal   Collection Time: 06/27/15  5:13 AM  Result Value Ref Range Status   Specimen Description URINE, CATHETERIZED  Final   Special Requests ADDED [id number] [phone]  Final   Culture (A)  Final    >=100,000 COLONIES/mL PSEUDOMONAS AERUGINOSA 50,000 COLONIES/mL VIRIDANS STREPTOCOCCUS    Report Status 07/01/2015 FINAL  Final   Organism ID, Bacteria PSEUDOMONAS AERUGINOSA (A)  Final      Susceptibility   Pseudomonas aeruginosa - MIC*    CEFTAZIDIME 4 SENSITIVE Sensitive     GENTAMICIN <=1 SENSITIVE Sensitive     PIP/TAZO <=4 SENSITIVE Sensitive     CEFEPIME <=1 SENSITIVE Sensitive     * >=100,000 COLONIES/mL PSEUDOMONAS AERUGINOSA     No urinary symptoms. Patient getting better on repeat evaluation. F/u with PCP. No further treatment indicated.  ED Provider: Lenn Sink, PA-C  Cassie L. Nicole Kindred, PharmD PGY2 Infectious Diseases Pharmacy Resident Pager: [phone] 07/02/2015 10:33 AM

## 2015-07-02 NOTE — Telephone Encounter (Signed)
Pt had a CT of abdomen and the neck recently in the ED. CT of abdomen showed gall stones without cholecystitis.   Forwarding to scheduling for follow to ED visit with PCP.

## 2015-07-02 NOTE — Telephone Encounter (Signed)
Post ED Visit - Positive Culture Follow-up  Culture report reviewed by antimicrobial stewardship pharmacist:  []  Elenor Quinones, Pharm.D. []  Heide Guile, Pharm.D., BCPS []  Parks Neptune, Pharm.D. []  Alycia Rossetti, Pharm.D., BCPS []  Jud, Pharm.D., BCPS, AAHIVP []  Legrand Como, Pharm.D., BCPS, AAHIVP [x]  Milus Glazier, Pharm.D. []  Stephens November, [place].D.  Positive urine culture Treated with cephalexin, organism sensitive to the same and no further patient follow-up is required at this time.  Hazle Nordmann 07/02/2015, 12:53 PM

## 2015-07-02 NOTE — ED Notes (Addendum)
[facility] contacted for transportation home 

## 2015-07-03 NOTE — Telephone Encounter (Signed)
Got patient scheduled for 7/14

## 2015-07-03 NOTE — Telephone Encounter (Signed)
See below - but can her come in for the blood work next week - I just want to make sure the liver enzymes are not continuously increasing.  Thanks.

## 2015-07-03 NOTE — Telephone Encounter (Signed)
Thank you for scheduling appointment with PCP.   I have pended lab work for elevation of elevated LFTs. Patient may have this done after her appointment with PCP.   PCP and I would like patient to still have RUQ ultrasound due to elevated liver enzymes despite having CT abdomen in ER. Please have schedulers to make this.   Also, referral placed to hematology regarding patient's elevated platelet count. Would you let patient know to expect a call?   Thanks!

## 2015-07-04 ENCOUNTER — Other Ambulatory Visit: Payer: Medicare Other

## 2015-07-04 NOTE — Telephone Encounter (Signed)
Ok to keep that appt with me - just want her to have blood work done soon.  thanks

## 2015-07-04 NOTE — Telephone Encounter (Signed)
Pt has an appt for July 14th. Is that a soon enough appt?

## 2015-07-11 NOTE — Progress Notes (Signed)
Subjective:    Patient ID: Sandy Barrett, female    DOB: January 24, 1932, 82 y.o.   MRN: [id number]   Sandy Barrett is a 82 y.o. female who presents today for an acute visit.    HPI Comments: Patient here for evaluation of vomiting one time 5 days ago.  Since then has been dry heaving through yesterday, no vomiting. Non bloody.Accompanied by her daughter. Patient has severe aphasia and daughter does the talking.Daughter states 'much better today.'Has been eating and drinking well.  No fever, chills. No known food contacts.  Denies dysuria, no flank pain. Daughter describes darker appearing urine in her briefs,unsure if scant diarrhea. Daughter believes her mom has had some 'mild' diffuse abdominal pain, 'nothing severe as she would let us know that.' H/o IBS. No h/o diverticulitis.     Chief Complaint  Patient presents with  . Emesis    Patient was given botox in her arm for spacticity. Patient started having sx on Saturday and dtr states she vomited. Pt had dry heeze spells on Sunday and Monday. Dtr states that pt has been fatigued. Dtr also mentions that there is some debris noticed that in pt briefs however, they are not sure if it is urine, fecal or a mix of both. Pt admits that she is not having dysuria or other UTI sx.      Past Medical History  Diagnosis Date  . HYPERTENSION   . HYPERTHYROIDISM   . OBESITY   . ARTHRITIS   . Posttraumatic stress disorder   . INSOMNIA   . MIGRAINE HEADACHE   . Hyperthyroidism     s/p I-131 ablation 03/2011 of multinod goiter  . Arthritis   . Stroke [facility]) 03/2014    dysarthria  . Diverticulosis   . Pulmonary embolism ([place]) 03/2014    with DVT   Allergies: Review of patient's allergies indicates no known allergies. Current Outpatient Prescriptions on File Prior to Visit  Medication Sig Dispense Refill  . acetaminophen (TYLENOL) 325 MG tablet Take 1-2 tablets (325-650 mg total) by mouth every 4 (four) hours as needed for mild pain.    [name]  albuterol (PROVENTIL HFA) 108 (90 Base) MCG/ACT inhaler Inhale 2 puffs into the lungs every 6 (six) hours as needed for wheezing or shortness of breath. 1 Inhaler 1  . benzonatate (TESSALON PERLES) 100 MG capsule Take 1 capsule (100 mg total) by mouth 3 (three) times daily as needed for cough. 30 capsule 1  . diclofenac sodium (VOLTAREN) 1 % GEL APPLY (2GMS) TOPICALLY THREE TIMES DAILY. 300 g 3  . gabapentin (NEURONTIN) 100 MG capsule TAKE 1 CAPSULE (100 MG TOTAL) BY MOUTH 3 (THREE) TIMES DAILY. 90 capsule 5  . guaiFENesin-dextromethorphan (ROBITUSSIN DM) 100-10 MG/5ML syrup Take 5 mLs by mouth every 4 (four) hours as needed for cough. 118 mL 0  . methimazole (TAPAZOLE) 5 MG tablet TAKE 1 TABLET 3 TIMES A WEEK 12 tablet 2  . Multiple Vitamins-Minerals (DECUBI-VITE) CAPS Take 1 capsule by mouth daily. Take 1 capsule by mouth daily for wound healing    . nitrofurantoin, macrocrystal-monohydrate, (MACROBID) 100 MG capsule Take 1 capsule by mouth daily. Reported on 05/15/2015    . omeprazole (PRILOSEC) 20 MG capsule TAKE 1 CAPSULE (20 MG TOTAL) BY MOUTH DAILY. ON AN EMPTY STOMACH 90 capsule 1  . Probiotic Product (ALIGN) 4 MG CAPS Take 1 capsule by mouth daily.     . propranolol (INDERAL) 10 MG tablet Take 1 tablet (10 mg total) by mouth 2 (  two) times daily. 60 tablet 3  . senna-docusate (SENOKOT-S) 8.6-50 MG per tablet Take 2 tablets by mouth daily as needed for mild constipation.     [name] terconazole (TERAZOL 7) 0.4 % vaginal cream Place 1 applicator vaginally at bedtime. 90 g 3  . traMADol (ULTRAM) 50 MG tablet TAKE 1 TO 2 TABLETS BY MOUTH 3 TIMES DAILY AS NEEDED 180 tablet 1  . traZODone (DESYREL) 50 MG tablet TAKE 1 TABLET (50 MG TOTAL) BY MOUTH AT BEDTIME AS NEEDED FOR SLEEP. 30 tablet 5  . warfarin (COUMADIN) 4 MG tablet Take 6 mg (1 tablet) on Mon/Wed/Fri. And take 4 mg (1 1/2 tablets) on Sun/Tues/Thurs/Sat. Or as directed. 40 tablet 3   Current Facility-Administered Medications on File Prior to  Visit  Medication Dose Route Frequency Provider Last Rate Last Dose  . albuterol (PROVENTIL) (2.5 MG/3ML) 0.083% nebulizer solution 2.5 mg  2.5 mg Nebulization Once Burnard Hawthorne, FNP        Social History  Substance Use Topics  . Smoking status: Never Smoker   . Smokeless tobacco: Never Used  . Alcohol Use: No    Review of Systems  Constitutional: Negative for fever and chills.  Respiratory: Negative for cough.   Cardiovascular: Negative for chest pain and palpitations.  Gastrointestinal: Positive for nausea and vomiting. Negative for abdominal pain, diarrhea, constipation, blood in stool and abdominal distention.  Genitourinary: Negative for dysuria, frequency and flank pain.      Objective:    BP 122/70 mmHg  Pulse 84  Temp(Src) 97.6 F (36.4 C) (Oral)  SpO2 96%   Physical Exam  Constitutional: She appears well-developed and well-nourished.  Cardiovascular: Normal rate, regular rhythm, normal heart sounds and normal pulses.   Pulmonary/Chest: Effort normal and breath sounds normal. She has no wheezes. She has no rhonchi. She has no rales.  Abdominal: Soft. Normal appearance and bowel sounds are normal. She exhibits no distension, no fluid wave, no ascites and no mass. There is generalized tenderness. There is no rigidity, no rebound, no guarding, no CVA tenderness, no tenderness at McBurney's point and negative Murphy's sign.  Mild diffuse abdominal tenderness with deep palpation. Patient doesn't grimace; she starts talking when I palpate abdomen.No guarding or rebound.   Neurological: She is alert.  Skin: Skin is warm and dry.  Psychiatric: She has a normal mood and affect. Her speech is normal and behavior is normal. Thought content normal.  Vitals reviewed.      Assessment & Plan:   1. Diarrhea, unspecified type Resolved. Hemodynamically stable. Reassured the daughter said mom is acting more like herself, more talkative, and is eating and drinking well. Mild,  diffuse tenderness of abdomen; no focal tenderness to suggest acute abdomen. Pending lab work to evaluate for leukocytosis, electrolyte abnormality.   - Comprehensive metabolic panel; Future - CBC with Differential/Platelet; Future - Lipase; Future  2. Dark urine-per daughter Pending UA. - Urinalysis, Routine w reflex microscopic (not at [facility]); Future    I am having Ms. Mortimer maintain her ALIGN, acetaminophen, DECUBI-VITE, senna-docusate, guaiFENesin-dextromethorphan, omeprazole, gabapentin, traZODone, nitrofurantoin (macrocrystal-monohydrate), propranolol, terconazole, warfarin, albuterol, benzonatate, diclofenac sodium, traMADol, and methimazole. We will continue to administer albuterol.   No orders of the defined types were placed in this encounter.     Start medications as prescribed and explained to patient on After Visit Summary ( AVS). Risks, benefits, and alternatives of the medications and treatment plan prescribed today were discussed, and patient expressed understanding.   Education regarding symptom management  and diagnosis given to patient.   Follow-up:Plan follow-up and return precautions given if any worsening symptoms or change in condition.   Continue to follow with [name], MD for routine health maintenance.   [name] Giusto and I agreed with plan.   Mable Paris, FNP

## 2015-07-12 ENCOUNTER — Ambulatory Visit: Payer: Medicare Other

## 2015-07-15 ENCOUNTER — Ambulatory Visit: Payer: Medicare Other | Admitting: Internal Medicine

## 2015-07-15 ENCOUNTER — Telehealth: Payer: Self-pay | Admitting: Emergency Medicine

## 2015-07-15 MED ORDER — PANTOPRAZOLE SODIUM 40 MG PO TBEC: 40.0000 mg | DELAYED_RELEASE_TABLET | Freq: Every day | ORAL | Status: DC

## 2015-07-15 NOTE — Telephone Encounter (Signed)
Spoke with daughter to inform.  

## 2015-07-15 NOTE — Telephone Encounter (Signed)
Stop omeprazole - start pantoprazole 40 mg daily =--- this is stronger and should help.  Call if no improvement

## 2015-07-15 NOTE — Telephone Encounter (Signed)
Pts daughter called in states pt is having issues with food staying down. Was able to keep gingerale down. Thinks it may be an issue with acid reflux/ heart burn. Can she try something other than prilosec?   Was given macrobid & tapazole recently for UTI and Thyroid. Told her to call Dr Loanne Drilling about Tapazole.

## 2015-07-16 ENCOUNTER — Ambulatory Visit: Payer: Medicare Other | Admitting: Physical Therapy

## 2015-07-19 ENCOUNTER — Other Ambulatory Visit (INDEPENDENT_AMBULATORY_CARE_PROVIDER_SITE_OTHER): Payer: Medicare Other

## 2015-07-19 ENCOUNTER — Ambulatory Visit (INDEPENDENT_AMBULATORY_CARE_PROVIDER_SITE_OTHER): Payer: Medicare Other | Admitting: Internal Medicine

## 2015-07-19 ENCOUNTER — Ambulatory Visit (INDEPENDENT_AMBULATORY_CARE_PROVIDER_SITE_OTHER): Payer: Medicare Other | Admitting: General Practice

## 2015-07-19 VITALS — BP 98/64 | HR 87 | Temp 98.4°F | Resp 16

## 2015-07-19 DIAGNOSIS — I871 Compression of vein: Secondary | ICD-10-CM | POA: Diagnosis not present

## 2015-07-19 DIAGNOSIS — I82403 Acute embolism and thrombosis of unspecified deep veins of lower extremity, bilateral: Secondary | ICD-10-CM

## 2015-07-19 DIAGNOSIS — I639 Cerebral infarction, unspecified: Secondary | ICD-10-CM | POA: Diagnosis not present

## 2015-07-19 DIAGNOSIS — I63311 Cerebral infarction due to thrombosis of right middle cerebral artery: Secondary | ICD-10-CM

## 2015-07-19 DIAGNOSIS — K5900 Constipation, unspecified: Secondary | ICD-10-CM | POA: Diagnosis not present

## 2015-07-19 DIAGNOSIS — Z5181 Encounter for therapeutic drug level monitoring: Secondary | ICD-10-CM

## 2015-07-19 DIAGNOSIS — R748 Abnormal levels of other serum enzymes: Secondary | ICD-10-CM

## 2015-07-19 DIAGNOSIS — I2699 Other pulmonary embolism without acute cor pulmonale: Secondary | ICD-10-CM

## 2015-07-19 DIAGNOSIS — R1310 Dysphagia, unspecified: Secondary | ICD-10-CM | POA: Diagnosis not present

## 2015-07-19 LAB — COMPREHENSIVE METABOLIC PANEL
ALT: 49 U/L — ABNORMAL HIGH (ref 0–35)
AST: 16 U/L (ref 0–37)
Albumin: 3.6 g/dL (ref 3.5–5.2)
Alkaline Phosphatase: 125 U/L — ABNORMAL HIGH (ref 39–117)
BUN: 11 mg/dL (ref 6–23)
CO2: 28 mEq/L (ref 19–32)
Calcium: 9.5 mg/dL (ref 8.4–10.5)
Chloride: 104 mEq/L (ref 96–112)
Creatinine, Ser: 0.39 mg/dL — ABNORMAL LOW (ref 0.40–1.20)
GFR: 202.9 mL/min (ref >60.00)
Glucose, Bld: 78 mg/dL (ref 70–99)
Potassium: 3.7 mEq/L (ref 3.5–5.1)
Sodium: 139 mEq/L (ref 135–145)
Total Bilirubin: 1.5 mg/dL — ABNORMAL HIGH (ref 0.2–1.2)
Total Protein: 6.8 g/dL (ref 6.0–8.3)

## 2015-07-19 LAB — CBC WITH DIFFERENTIAL/PLATELET
Basophils Absolute: 0 10*3/uL (ref 0.0–0.1)
Basophils Relative: 0.1 % (ref 0.0–3.0)
Eosinophils Absolute: 0.3 10*3/uL (ref 0.0–0.7)
Eosinophils Relative: 3.3 % (ref 0.0–5.0)
HCT: 43.5 % (ref 36.0–46.0)
Hemoglobin: 14.3 g/dL (ref 12.0–15.0)
Lymphocytes Relative: 15.3 % (ref 12.0–46.0)
Lymphs Abs: 1.2 10*3/uL (ref 0.7–4.0)
MCHC: 32.8 g/dL (ref 30.0–36.0)
MCV: 92.1 fl (ref 78.0–100.0)
Monocytes Absolute: 0.1 10*3/uL (ref 0.1–1.0)
Monocytes Relative: 1.6 % — ABNORMAL LOW (ref 3.0–12.0)
Neutro Abs: 6.5 10*3/uL (ref 1.4–7.7)
Neutrophils Relative %: 79.7 % — ABNORMAL HIGH (ref 43.0–77.0)
Platelets: 840 10*3/uL — ABNORMAL HIGH (ref 150.0–400.0)
RBC: 4.72 Mil/uL (ref 3.87–5.11)
RDW: 17.4 % — ABNORMAL HIGH (ref 11.5–15.5)
WBC: 8.2 10*3/uL (ref 4.0–10.5)

## 2015-07-19 LAB — POCT INR: INR: 2

## 2015-07-19 LAB — GAMMA GT: GGT: 88 U/L — ABNORMAL HIGH (ref 7–51)

## 2015-07-19 NOTE — Patient Instructions (Addendum)
  Test(s) ordered today. Your results will be released to [place] (or called to you) after review, usually within 72hours after test completion. If any changes need to be made, you will be notified at that same time.   Medications reviewed and updated.  No changes recommended at this time.  A swallowing evaluation and Echo have been ordered.

## 2015-07-19 NOTE — Progress Notes (Signed)
Pre visit review using our clinic review tool, if applicable. No additional management support is needed unless otherwise documented below in the visit note. 

## 2015-07-19 NOTE — Progress Notes (Signed)
I have reviewed and agree with the plan. 

## 2015-07-19 NOTE — Progress Notes (Signed)
Subjective:    Patient ID: Sandy Barrett, female    DOB: 1932/05/01, 82 y.o.   MRN: [id number]  HPI She is here for follow up.  She went to the ED 6/22 for abdominal pain and 6/26 for constipation.  She had an enema in the ED on 6/22 and had passage of stool. She was also diagnosed with a UTI at that time and placed on keflex.  She had a Ct scan at that time and had constipation and a gallstone was seen .  her LFTs were elevated.  She was advised to take miralax after 6/22, but did not have a BM.  She returned to the ED on 6/26 for that and neck and vein swelling in her neck and chest.  A ct scan of the chest was done at that time.    Constipation:  She is taking the miralax every other day.  She is having regular bowel movements.  She has not had any abdominal pain.  Thyrogmegaly:  She has an enlarged thyroid that extends into her chest.  It is displacing her esophagus, trachea and SVC.    Dysphagia/choking:  Since her stroke she has had difficulty swallowing.  Her last swallow evaluation was March 2016 and showed moderate pharyngeal and moderate oral phase dysphagia.  She has had diificulty with eating, but recently it has gotten worse.  Yesterday she was eating chicken and rice and started choking and then regurgitated and vomited a small amount.  This does happen at least once-twice a week.  She then had some difficulty breathing after which lasted 6-8 minutes, which is the first time that happened.    Monday night she was having auditory and visual hallucination.  Her neurologist said this may be related to a UTI or her stroke.  She has not had them since Monday.    Neck swelling:  Her daughter feels her neck has only been swollen more recently and it increased just before they went to the ED the second time.  The swelling has decreased since then but there is still some slightly swelling.   Medications and allergies reviewed with patient and updated if appropriate.  Patient Active  Problem List   Diagnosis Date Noted  . Thrombophlebitis of breast, right 06/18/2015  . Tracheal deviation 06/06/2015  . Sensorineural hearing loss, bilateral, moderate-moderately severe 04/30/2015  . Abdominal wall lump 03/09/2015  . Frequent UTI 02/06/2015  . Primary osteoarthritis involving multiple joints 07/26/2014  . Pernicious anemia 07/26/2014  . Hearing loss 07/26/2014  . Encounter for therapeutic drug monitoring 07/17/2014  . Spastic hemiparesis affecting dominant side ([place]), right 05/31/2014  . DVT of lower extremity, bilateral ([place]) 03/14/2014  . Global aphasia 03/14/2014  . 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 Dysphagia due to recent stroke 03/14/2014  . Aphasia due to stroke 03/13/2014  . Cerebral infarction due to embolism of left middle cerebral artery ([place])   . Stroke, embolic ([place]) 123456  . Pulmonary embolism ([place]) 03/08/2014  . Back pain 08/29/2013  . Primary localized osteoarthrosis, lower leg 03/06/2013  . IBS (irritable bowel syndrome)   . Multinodular goiter 01/13/2011  . Hyperthyroidism 11/11/2009  . Insomnia 07/03/2009  . POSTTRAUMATIC STRESS DISORDER 08/22/2008  . OBESITY 04/21/2008  . Osteoarthritis 04/21/2008  . MIGRAINE HEADACHE 04/20/2008  . Essential hypertension 04/20/2008    Current Outpatient Prescriptions on File Prior to Visit  Medication Sig Dispense Refill  . acetaminophen (TYLENOL) 325 MG tablet Take 1-2 tablets (325-650 mg total)  by mouth every 4 (four) hours as needed for mild pain.    [name] albuterol (PROVENTIL HFA) 108 (90 Base) MCG/ACT inhaler Inhale 2 puffs into the lungs every 6 (six) hours as needed for wheezing or shortness of breath. 1 Inhaler 1  . diclofenac sodium (VOLTAREN) 1 % GEL APPLY (2GMS) TOPICALLY THREE TIMES DAILY. (Patient taking differently: APPLY (2GMS) TOPICALLY THREE TIMES DAILY AS NEEDED FOR PAIN) 300 g 3  . gabapentin (NEURONTIN) 100 MG capsule TAKE 1 CAPSULE (100 MG TOTAL) BY MOUTH 3 (THREE) TIMES DAILY. 90  capsule 5  . guaiFENesin-dextromethorphan (ROBITUSSIN DM) 100-10 MG/5ML syrup Take 5 mLs by mouth every 4 (four) hours as needed for cough. 118 mL 0  . methimazole (TAPAZOLE) 5 MG tablet TAKE 1 TABLET 3 TIMES A WEEK 12 tablet 2  . Multiple Vitamins-Minerals (DECUBI-VITE) CAPS Take 1 capsule by mouth daily.     . nitrofurantoin, macrocrystal-monohydrate, (MACROBID) 100 MG capsule Take 0.5 capsules by mouth 2 (two) times daily. Reported on 05/15/2015    . pantoprazole (PROTONIX) 40 MG tablet Take 1 tablet (40 mg total) by mouth daily. Take 30 minutes before a meal 90 tablet 3  . polyethylene glycol (MIRALAX) packet Take 17 g by mouth 2 (two) times daily. 14 each 0  . Probiotic Product (ALIGN) 4 MG CAPS Take 1 capsule by mouth daily.     . propranolol (INDERAL) 10 MG tablet Take 1 tablet (10 mg total) by mouth 2 (two) times daily. 60 tablet 3  . senna-docusate (SENOKOT-S) 8.6-50 MG per tablet Take 2 tablets by mouth daily as needed for mild constipation.     [name] terconazole (TERAZOL 7) 0.4 % vaginal cream Place 1 applicator vaginally at bedtime. 90 g 3  . traMADol (ULTRAM) 50 MG tablet TAKE 1 TO 2 TABLETS BY MOUTH 3 TIMES DAILY AS NEEDED (Patient taking differently: TAKE 1 TO 2 TABLETS BY MOUTH 3 TIMES DAILY AS NEEDED FOR PAIN) 180 tablet 1  . traZODone (DESYREL) 50 MG tablet TAKE 1 TABLET (50 MG TOTAL) BY MOUTH AT BEDTIME AS NEEDED FOR SLEEP. 30 tablet 5  . warfarin (COUMADIN) 4 MG tablet Take 6 mg (1 tablet) on Mon/Wed/Fri. And take 4 mg (1 1/2 tablets) on Sun/Tues/Thurs/Sat. Or as directed. (Patient taking differently: Take 4-6 mg by mouth daily. Take 6 mg on Mon/Wed/Fri/Sat. And take 4 mg on Sun/Tues/Thurs/. Or as directed.) 40 tablet 3   Current Facility-Administered Medications on File Prior to Visit  Medication Dose Route Frequency Provider Last Rate Last Dose  . albuterol (PROVENTIL) (2.5 MG/3ML) 0.083% nebulizer solution 2.5 mg  2.5 mg Nebulization Once Burnard Hawthorne, FNP        Past  Medical History  Diagnosis Date  . HYPERTENSION   . HYPERTHYROIDISM   . OBESITY   . ARTHRITIS   . Posttraumatic stress disorder   . INSOMNIA   . MIGRAINE HEADACHE   . Hyperthyroidism     s/p I-131 ablation 03/2011 of multinod goiter  . Arthritis   . Stroke [facility]) 03/2014    dysarthria  . Diverticulosis   . Pulmonary embolism ([place]) 03/2014    with DVT    Past Surgical History  Procedure Laterality Date  . Abdominal hysterectomy  1976  . Total hip arthroplasty  1998     right  . Tonsillectomy    . Breast surgery      biopsy  . Radiology with anesthesia Left 03/08/2014    Procedure: RADIOLOGY WITH ANESTHESIA;  Surgeon: Fritz Pickerel  Estanislado Pandy, MD;  Location: [facility];  Service: Radiology;  Laterality: Left;    Social History   Social History  . Marital Status: Widowed    Spouse Name: N/A  . Number of Children: 2  . Years of Education: 14   Occupational History  . retired     Restaurant manager, fast food   Social History Main Topics  . Smoking status: Never Smoker   . Smokeless tobacco: Never Used  . Alcohol Use: No  . Drug Use: No  . Sexual Activity: No   Other Topics Concern  . Not on file   Social History Narrative   Widowed, lived alone prior to CVA 03/2014   SNF at [facility], then home 07/2014 with dtr   Right handed   Caffeine use- occasionally drinks tea    Family History  Problem Relation Age of Onset  . Asthma Mother   . Asthma Father   . Prostate cancer Father   . Stroke Brother   . Thyroid disease Neg Hx     Review of Systems  Constitutional: Negative for fever and appetite change.  HENT: Positive for trouble swallowing.   Respiratory: Positive for cough (after choking ) and choking (when eating). Negative for shortness of breath and wheezing.   Cardiovascular: Positive for leg swelling. Negative for chest pain and palpitations.  Gastrointestinal: Negative for abdominal pain, constipation and blood in stool.  Neurological: Negative for headaches.        Objective:   Filed Vitals:   07/19/15 1453  BP: 98/64  Pulse: 87  Temp: 98.4 F (36.9 C)  Resp: 16   Filed Weights   There is no weight on file to calculate BMI.   Physical Exam  Constitutional: She appears well-developed and well-nourished. No distress.  HENT:  Head: Normocephalic and atraumatic.  Eyes: Conjunctivae are normal.  Neck: Neck supple.  Slight swelling at base of anterior neck, distended veins in neck and chest  Cardiovascular: Normal rate and regular rhythm.   Pulmonary/Chest: Effort normal. No respiratory distress.  Bibasilar dry crackles - likely atelectasis  Abdominal: Soft. She exhibits no distension. There is no tenderness.  Musculoskeletal: Edema: mild b/l LE.  Lymphadenopathy:    She has no cervical adenopathy.  Skin: Skin is warm and dry. She is not diaphoretic.       Ct A/P:  (06/27/15) IMPRESSION: Aortic atherosclerosis.  Solitary gallstone without evidence of cholecystitis.  Sigmoid diverticulosis without inflammation.  Large amount of stool seen in rectum concerning for impaction.  Stable large ventral hernia containing small bowel loops is noted in the pelvis, without evidence of incarceration or obstruction.  Stable soft tissue stranding is noted in the right breast suggesting edema or inflammation.  Collateral veins are noted in the anterior abdominal wall left side of the chest which are stable.  CT Neck:  (07/02/15) IMPRESSION: Severe, stable thyromegaly corresponding to known multi nodular goiter. Given extensive varicosities throughout the chest and neck, this is most compatible with chronic venous obstruction/SVC syndrome. Calcifications LEFT internal jugular vein most compatible with chronic thrombosis.  No acute process in the neck. Patent airway.      Assessment & Plan:   See Problem List for Assessment and Plan of chronic medical problems.

## 2015-07-20 ENCOUNTER — Encounter: Payer: Self-pay | Admitting: Internal Medicine

## 2015-07-20 DIAGNOSIS — I871 Compression of vein: Secondary | ICD-10-CM | POA: Insufficient documentation

## 2015-07-20 DIAGNOSIS — K5900 Constipation, unspecified: Secondary | ICD-10-CM | POA: Insufficient documentation

## 2015-07-20 DIAGNOSIS — R748 Abnormal levels of other serum enzymes: Secondary | ICD-10-CM | POA: Insufficient documentation

## 2015-07-20 DIAGNOSIS — R1310 Dysphagia, unspecified: Secondary | ICD-10-CM | POA: Insufficient documentation

## 2015-07-20 NOTE — Assessment & Plan Note (Signed)
At ED visit last month ? Related to GB stone - CT scan showed no cholecystitis Recheck cmp today Further evaluation based on labs

## 2015-07-20 NOTE — Assessment & Plan Note (Addendum)
Ct scan 2011 to 2016 stable Has swelling in neck and visible chest veins - swelling in neck seems to be worse recently Slightly increased dysphagia Continue to monitor

## 2015-07-20 NOTE — Assessment & Plan Note (Signed)
Last swallow eval 03/2014 = moderate pharyngeal and moderate oral phase dysphagia It has gotten worse recently - 1-2 weekly episode of choking/regurgitation and had one episode of difficulty breathing Will order another swallowing eval to see if it has changed/new recommendations Advised precautions and discussed risk for aspiration

## 2015-07-23 ENCOUNTER — Telehealth: Payer: Self-pay

## 2015-07-23 ENCOUNTER — Ambulatory Visit: Payer: Medicare Other | Attending: Internal Medicine | Admitting: Physical Therapy

## 2015-07-23 DIAGNOSIS — R2681 Unsteadiness on feet: Secondary | ICD-10-CM | POA: Insufficient documentation

## 2015-07-23 DIAGNOSIS — R2689 Other abnormalities of gait and mobility: Secondary | ICD-10-CM | POA: Diagnosis not present

## 2015-07-23 DIAGNOSIS — I69351 Hemiplegia and hemiparesis following cerebral infarction affecting right dominant side: Secondary | ICD-10-CM | POA: Insufficient documentation

## 2015-07-23 DIAGNOSIS — I63442 Cerebral infarction due to embolism of left cerebellar artery: Secondary | ICD-10-CM

## 2015-07-23 NOTE — Telephone Encounter (Signed)
Pt's daughter called requesting an Rx for crushed Protonix.  She states this was discussed at the last OV.  She is also requesting an Rx for a back rest to be faxed to [facility] at [phone]

## 2015-07-23 NOTE — Therapy (Signed)
[facility] [address] [place], [place], [address] Phone: [phone]   Fax:  [phone]  Physical Therapy Evaluation  Patient Details  Name: Sandy Barrett MRN: [id number] Date of Birth: 01-07-1932 Referring Provider: [name], MD  Encounter Date: 07/23/2015      PT End of Session - 07/23/15 1941    Visit Number 1   Number of Visits 9  eval + 8 visits   Date for PT Re-Evaluation 09/06/15   Authorization Type Medicare Traditional primary; BCBS secondary   Authorization Time Period G Codes required   PT Start Time 1449   PT Stop Time 1529   PT Time Calculation (min) 40 min   Equipment Utilized During Treatment Other (comment)  attempted to utilize Body Weight Support system   Activity Tolerance Patient limited by pain   Behavior During Therapy --  Pt declined all standing and transfers      Past Medical History  Diagnosis Date  . HYPERTENSION   . HYPERTHYROIDISM   . OBESITY   . ARTHRITIS   . Posttraumatic stress disorder   . INSOMNIA   . MIGRAINE HEADACHE   . Hyperthyroidism     s/p I-131 ablation 03/2011 of multinod goiter  . Arthritis   . Stroke [facility]) 03/2014    dysarthria  . Diverticulosis   . Pulmonary embolism ([place]) 03/2014    with DVT    Past Surgical History  Procedure Laterality Date  . Abdominal hysterectomy  1976  . Total hip arthroplasty  1998     right  . Tonsillectomy    . Breast surgery      biopsy  . Radiology with anesthesia Left 03/08/2014    Procedure: RADIOLOGY WITH ANESTHESIA;  Surgeon: Rob Hickman, MD;  Location: [facility];  Service: Radiology;  Laterality: Left;    There were no vitals filed for this visit.       Subjective Assessment - 07/23/15 1455    Subjective Subjective portion provided by daughter, [name], due to patient having global aphasia. Pt has been receiving PT services at [facility] but daughter felt that treatment was passive. Daughter  wouldl like for patient to "strengthening her legs in a more active way - maybe use the machines (NuStep) and try some standing. I'm not interrested in working on transfers, because I can't physically help her with that at home."   Patient is accompained by: Family member  daughter, [name]   Pertinent History PMH significant for: L MCA CVA (03/2014) with severe R spastic hemiplegia, global aphasia, and apraxia; HTN, HLD hyperthyroidism, OA, PE with DVT (03/2014), R THA.   Patient Stated Goals Per daughter, "Strengthen her legs in a more active way - maybe use the machines (NuStep) and try some standing. I'm not interrested in working on transfers, because I can't physically help her with that at home."   Currently in Pain? No/denies            [facility] PT Assessment - 07/23/15 0001    Assessment   Medical Diagnosis Poor balance; physical deconditioning; Cerebrovascular accident (CVA) due to thrombosis of left middle cerebral artery    Referring Provider [name], MD   Onset Date/Surgical Date 03/08/14   Precautions   Precautions Fall   Restrictions   Weight Bearing Restrictions No   Balance Screen   Has the patient fallen in the past 6 months No   Has the patient had a decrease in activity level because of  a fear of falling?  Yes   Is the patient reluctant to leave their home because of a fear of falling?  No   Home Social worker Private residence   Living Arrangements Children   Available Help at Discharge Available 24 hours/day   Type of [place] entrance   [place] - manual;Hospital bed  [place] lift   Prior Function   Level of [place]  prior to CVA 03/08/14   Vocation Retired   Associate Professor   Overall Cognitive Status Difficult to assess  due to global aphasia   Sensation   Light Touch Not tested  difficult to formally assess due to global aphasia   Coordination    Gross Motor Movements are Fluid and Coordinated No   Heel Shin Test Unable due to dense R hemiplegia and motor apraxia   ROM / Strength   AROM / PROM / Strength Strength   Strength   Overall Strength Unable to assess   Overall Strength Comments Unable to formally assess due to global aphasia, motor apraxia; however, no active movement in RLE on evaluation.   Transfers   Transfers --  unable to assess due to pt refusal   Chief Technology Officer Yes   Wheelchair Assistance 3: Mod assist   Wheelchair Assistance Details (indicate cue type and reason) After attempting to encourage pt to participate in transfers, standing, and use of BWS system (without success), attempted to manually facilitate and demonstrate use of LLE for self-propulsion of w/c; however, pt unable. Pt was able to self-propel w/c backwards x10' using LLE.   Wheelchair Propulsion Left lower extremity   Wheelchair Parts Management Needs assistance  total A   Distance 10                   [facility] Adult PT Treatment/Exercise - 07/23/15 0001    Transfers   Comments Daughter expressing interest in patient using Body Weight Support System for standing. Therefore, attempted to don BWS vest; however, pt not agreeable to wearing vest or participating in standing.   Ambulation/Gait   Ambulation/Gait --  N/A; pt nonambulatory                PT Education - 07/23/15 1926    Education provided Yes   Education Details PT eval findings, goals, and POC. Suggested daughters consider power w/c to increase pt independence with mobility and pressure relief.   Person(s) Educated Patient;Child(ren)  daughter, [name]   Methods Explanation   Comprehension Verbalized understanding          PT Short Term Goals - 07/23/15 2001    PT SHORT TERM GOAL #1   Title Patient will participate in > 75% of initial 2 PT sessions to indicate patient is an appropriate candidate for outpatient PT.  (Target date:  08/13/15)   PT SHORT TERM GOAL #2   Title Patient will tolerate standing with total A of standing frame for >/= 5 miinutes to indicate increased standing tolerance. (08/13/15)   PT SHORT TERM GOAL #3   Title Pt will perform NuStep for >/= 5 minutes (lower extremities only) to indicate increased activity tolerance.  (08/13/15)           PT Long Term Goals - 07/23/15 2006    PT LONG TERM GOAL #1   Title Patient will tolerate standing with total A of standing frame for >/=  15 miinutes to indicate significant improvment in standing tolerance.  (Target date: 09/02/15)   PT LONG TERM GOAL #2   Title Pt will perform NuStep for >/= 10 minutes (lower extremities only) to indicate increased activity tolerance.  (09/02/15)   PT LONG TERM GOAL #3   Title Pt will participate in standing with use of body weight support system to achieve family's goal of increasing supported standing tolerance.  (09/02/15)               Plan - 07/23/15 1950    Clinical Impression Statement Pt is an 82 y/o F referred to outpatient PT to address functional impairments associated with L MCA CVA (03/08/15). PMH significant for: L MCA CVA (03/2014) with severe R hemiplegia, global aphasia, and apraxia; HTN, HLD hyperthyroidism, OA, PE with DVT (03/2014), R THA. PT evaluation planned to focus on attempting use of body weight support (BSW) system, as this was daughter's rationale for return to therapy. However, patient not amenable to use of BWS system for standing and also declined transfers. PT evaluation reveals decreased stability/independence with all aspects of functional mobility due to R spastic hemiplegia, motor apraxia, and R inattention. Pt also appears to have RUE/LE sensory impairments; however, unable to confirm with formal assessment due to global aphasia. Patient's daughter explicitly declined transfer training (lateral scooting, squat pivot, and slide board transfers), requesting that this episode of PT focus on standing  and LE strengthening. Pt will therefore benefit from skilled outpatient PT for up to 8 sessions over a 6-week period. If patient consisenlty participates during initial 4 viists, will consider continuing for subsequent 4 visits.    Rehab Potential Fair   Clinical Impairments Affecting Rehab Potential chronicity of R hemiplegia; R inattention; global aphasia; and chronic knee pain   PT Frequency --  8 total sessions   PT Duration 6 weeks   PT Treatment/Interventions ADLs/Self Care Home Management;Stair training;Neuromuscular re-education;Functional mobility training;Patient/family education;Therapeutic activities;Balance training;Orthotic Fit/Training;Therapeutic exercise   PT Next Visit Plan Attempt use of standing frame   Consulted and Agree with Plan of Care Patient;Family member/caregiver   Family Member Consulted daughter, [name]      Patient will benefit from skilled therapeutic intervention in order to improve the following deficits and impairments:  Decreased cognition, Pain, Other (comment), Decreased balance, Decreased activity tolerance, Decreased endurance, Decreased coordination, Decreased mobility, Impaired sensation, Impaired tone, Impaired UE functional use, Decreased strength, Postural dysfunction (Pain will be monitored but not directly addressed by PT due to nature of referral)  Visit Diagnosis: Hemiplegia and hemiparesis following cerebral infarction affecting right dominant side ([place]) - Plan: PT plan of care cert/re-cert  Other abnormalities of gait and mobility - Plan: PT plan of care cert/re-cert  Unsteadiness on feet - Plan: PT plan of care cert/re-cert      G-Codes - [id number]    Functional Assessment Tool Used Clinical Judgement   Functional Limitation Changing and maintaining body position   Changing and Maintaining Body Position Current Status [id number]) At least 80 percent but less than 100 percent impaired, limited or restricted   Changing and Maintaining  Body Position Goal Status [id number]) At least 60 percent but less than 80 percent impaired, limited or restricted       Problem List Patient Active Problem List   Diagnosis Date Noted  . SVC (superior vena cava obstruction), chronic 07/20/2015  . Dysphagia 07/20/2015  . Elevated liver enzymes 07/20/2015  . Constipation 07/20/2015  . Thrombophlebitis of breast, right 06/18/2015  .  Tracheal deviation 06/06/2015  . Sensorineural hearing loss, bilateral, moderate-moderately severe 04/30/2015  . Abdominal wall lump 03/09/2015  . Frequent UTI 02/06/2015  . Primary osteoarthritis involving multiple joints 07/26/2014  . Pernicious anemia 07/26/2014  . Hearing loss 07/26/2014  . Encounter for therapeutic drug monitoring 07/17/2014  . Spastic hemiparesis affecting dominant side ([place]), right 05/31/2014  . DVT of lower extremity, bilateral ([place]) 03/14/2014  . Global aphasia 03/14/2014  . 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 Aphasia due to stroke 03/13/2014  . Cerebral infarction due to embolism of left middle cerebral artery ([place])   . Stroke, embolic ([place]) [id number]  . Pulmonary embolism ([place]) 03/08/2014  . Back pain 08/29/2013  . Primary localized osteoarthrosis, lower leg 03/06/2013  . IBS (irritable bowel syndrome)   . Multinodular goiter 01/13/2011  . Hyperthyroidism 11/11/2009  . Insomnia 07/03/2009  . POSTTRAUMATIC STRESS DISORDER 08/22/2008  . OBESITY 04/21/2008  . Osteoarthritis 04/21/2008  . MIGRAINE HEADACHE 04/20/2008  . Essential hypertension 04/20/2008   Billie Ruddy, PT, DPT [facility] [address] [place], [place], [address] Phone: [phone]   Fax:  [phone] 07/23/2015, 8:12 PM  Name: Sandy Barrett MRN: [id number] Date of Birth: Nov 26, 1932

## 2015-07-24 MED ORDER — PANTOPRAZOLE SODIUM 40 MG PO PACK: 40.0000 mg | PACK | Freq: Every day | ORAL | Status: DC

## 2015-07-24 NOTE — Telephone Encounter (Signed)
Spoke with pts daughter, she is okay with pt seeing blood specialist. Please enter referral. Prescription printed for back rest.

## 2015-07-24 NOTE — Telephone Encounter (Addendum)
protonix rx sent to [facility] for her to try - it is a powder.  What type of back rest?  Dr Loanne Drilling will discuss possible treatment for the thyroid with radioactive iodine.  Liver tests are now normal.  If the gallstone got temporarily stuck that may have caused the symptoms - the options as far as that goes is to have her see surgeon to consider surgery or do nothing and hope it does not occur again.  Her platelet count is still elevated - I think we should have her see a blood specialist to help [place] find out why - let me know if she agrees and I will refer.

## 2015-07-29 ENCOUNTER — Telehealth: Payer: Self-pay | Admitting: Hematology and Oncology

## 2015-07-29 ENCOUNTER — Ambulatory Visit (HOSPITAL_COMMUNITY): Admission: RE | Admit: 2015-07-29 | Payer: Medicare Other | Source: Ambulatory Visit

## 2015-07-29 ENCOUNTER — Ambulatory Visit: Payer: Medicare Other | Admitting: Hematology and Oncology

## 2015-07-29 ENCOUNTER — Inpatient Hospital Stay (HOSPITAL_COMMUNITY): Admission: RE | Admit: 2015-07-29 | Payer: Medicare Other | Source: Ambulatory Visit

## 2015-07-29 NOTE — Telephone Encounter (Signed)
Confirmed appt with daughter; completed intake

## 2015-07-30 ENCOUNTER — Encounter: Payer: Medicare Other | Attending: Physical Medicine & Rehabilitation

## 2015-07-30 ENCOUNTER — Ambulatory Visit (HOSPITAL_BASED_OUTPATIENT_CLINIC_OR_DEPARTMENT_OTHER): Payer: Medicare Other | Admitting: Physical Medicine & Rehabilitation

## 2015-07-30 ENCOUNTER — Encounter: Payer: Self-pay | Admitting: Physical Medicine & Rehabilitation

## 2015-07-30 VITALS — BP 99/66 | HR 78 | Temp 98.6°F

## 2015-07-30 DIAGNOSIS — M7541 Impingement syndrome of right shoulder: Secondary | ICD-10-CM | POA: Diagnosis not present

## 2015-07-30 DIAGNOSIS — I639 Cerebral infarction, unspecified: Secondary | ICD-10-CM | POA: Diagnosis not present

## 2015-07-30 DIAGNOSIS — G8110 Spastic hemiplegia affecting unspecified side: Secondary | ICD-10-CM | POA: Diagnosis not present

## 2015-07-30 DIAGNOSIS — I69320 Aphasia following cerebral infarction: Secondary | ICD-10-CM | POA: Insufficient documentation

## 2015-07-30 DIAGNOSIS — M6289 Other specified disorders of muscle: Secondary | ICD-10-CM | POA: Insufficient documentation

## 2015-07-30 DIAGNOSIS — R482 Apraxia: Secondary | ICD-10-CM

## 2015-07-30 DIAGNOSIS — G8190 Hemiplegia, unspecified affecting unspecified side: Secondary | ICD-10-CM | POA: Diagnosis not present

## 2015-07-30 DIAGNOSIS — IMO0002 Reserved for concepts with insufficient information to code with codable children: Secondary | ICD-10-CM

## 2015-07-30 NOTE — Patient Instructions (Signed)
Next visit will be for Botox injection. We'll do the same dosing and same muscle selection as last visit

## 2015-07-30 NOTE — Progress Notes (Signed)
Subjective:    Patient ID: Sandy Barrett, female    DOB: February 27, 1932, 82 y.o.   MRN: [id number] FCR50 [id number] [id number] FPL50  PT50 FCU50 Botox on 6/16 HPI Patient had no complications from the injection. Her daughter feels like her hand has relaxed quite a bit. She is able to dress without pain. Able to perform hand hygiene as well as nail hygiene on the right side. Patient is undergoing physical therapy at [facility] outpatient rehabilitation No falls. Pain Inventory Average Pain 4 Pain Right Now 4 My pain is intermittent, sharp, stabbing and aching  In the last 24 hours, has pain interfered with the following? General activity 4 Relation with others 4 Enjoyment of life 4 What TIME of day is your pain at its worst? morning Sleep (in general) Fair  Pain is worse with: sitting and standing Pain improves with: medication and injections Relief from Meds: 5  Mobility use a wheelchair needs help with transfers  Function retired I need assistance with the following:  dressing, bathing, toileting, meal prep, household duties and shopping  Neuro/Psych bladder control problems bowel control problems weakness trouble walking dizziness confusion  Prior Studies Any changes since last visit?  no  Physicians involved in your care Any changes since last visit?  no   Family History  Problem Relation Age of Onset  . Asthma Mother   . Asthma Father   . Prostate cancer Father   . Stroke Brother   . Thyroid disease Neg Hx    Social History   Social History  . Marital status: Widowed    Spouse name: N/A  . Number of children: 2  . Years of education: 14   Occupational History  . retired     Restaurant manager, fast food   Social History Main Topics  . Smoking status: Never Smoker  . Smokeless tobacco: Never Used  . Alcohol use No  . Drug use: No  . Sexual activity: No   Other Topics Concern  . None   Social History Narrative   Widowed, lived alone prior to CVA 03/2014   SNF at [facility], then home 07/2014 with dtr   Right handed   Caffeine use- occasionally drinks tea   Past Surgical History:  Procedure Laterality Date  . ABDOMINAL HYSTERECTOMY  1976  . BREAST SURGERY     biopsy  . RADIOLOGY WITH ANESTHESIA Left 03/08/2014   Procedure: RADIOLOGY WITH ANESTHESIA;  Surgeon: Rob Hickman, MD;  Location: [facility];  Service: Radiology;  Laterality: Left;  . TONSILLECTOMY    . TOTAL HIP ARTHROPLASTY  1998    right   Past Medical History:  Diagnosis Date  . ARTHRITIS   . Arthritis   . Diverticulosis   . HYPERTENSION   . HYPERTHYROIDISM   . Hyperthyroidism    s/p I-131 ablation 03/2011 of multinod goiter  . INSOMNIA   . MIGRAINE HEADACHE   . OBESITY   . Posttraumatic stress disorder   . Pulmonary embolism ([place]) 03/2014   with DVT  . Stroke [facility]) 03/2014   dysarthria   There were no vitals taken for this visit.  Opioid Risk Score:   Fall Risk Score:  `1  Depression screen PHQ 2/9  Depression screen [facility] 2/9 07/30/2015 06/21/2015 04/29/2015 03/18/2015 02/04/2015 11/26/2014 09/17/2014  Decreased Interest 0 0 0 0 0 0 0  Down, Depressed, Hopeless 0 0 1 1 1 1  0  PHQ - 2 Score 0 0 1 1 1 1  0  Some recent  data might be hidden    Review of Systems  All other systems reviewed and are negative.      Objective:   Physical Exam  Constitutional: She appears well-developed and well-nourished.  HENT:  Head: Normocephalic and atraumatic.  Eyes: Conjunctivae and EOM are normal. Pupils are equal, round, and reactive to light.  Neurological: She is alert. She exhibits abnormal muscle tone. Coordination and gait abnormal.  Evidence of apraxia affecting left upper extremity. Motor strength is 0/5 in the right deltoid, biceps, triceps, grip 3 minus in the right hip flexor, knee extensor and 0 at the ankle dorsiflexor, plantar flexor  Tone is modified Ashworth scale 1 at the finger flexors and thumb flexor, 0 at the wrist flexor  Psychiatric: She is  noncommunicative.  Severely aphasic, both receptive and expressive Cannot access cognition due to severe aphasia. She does follow commands with visual cueing  Nursing note and vitals reviewed.         Assessment & Plan:  1. Right spastic hemiplegia, injury to left MCA infarct with a aphasia and apraxia. She said good relief of her spasticity, which interfered with hygiene in the right hand. Reviewed dosages and muscle selection. This appears to be appropriate. We'll plan on repeat in 7 weeks  Discussed with the patient as well as her daughter. Agree with plan  FCR50 FDS50 [place]

## 2015-07-31 ENCOUNTER — Ambulatory Visit (HOSPITAL_BASED_OUTPATIENT_CLINIC_OR_DEPARTMENT_OTHER): Payer: Medicare Other | Admitting: Hematology and Oncology

## 2015-07-31 ENCOUNTER — Telehealth: Payer: Self-pay | Admitting: Hematology and Oncology

## 2015-07-31 ENCOUNTER — Ambulatory Visit (HOSPITAL_BASED_OUTPATIENT_CLINIC_OR_DEPARTMENT_OTHER): Payer: Medicare Other

## 2015-07-31 VITALS — BP 108/59 | HR 77 | Temp 98.2°F | Resp 18

## 2015-07-31 DIAGNOSIS — I2699 Other pulmonary embolism without acute cor pulmonale: Secondary | ICD-10-CM

## 2015-07-31 DIAGNOSIS — D473 Essential (hemorrhagic) thrombocythemia: Secondary | ICD-10-CM

## 2015-07-31 DIAGNOSIS — Z86711 Personal history of pulmonary embolism: Secondary | ICD-10-CM

## 2015-07-31 DIAGNOSIS — R1310 Dysphagia, unspecified: Secondary | ICD-10-CM

## 2015-07-31 DIAGNOSIS — E039 Hypothyroidism, unspecified: Secondary | ICD-10-CM | POA: Diagnosis not present

## 2015-07-31 DIAGNOSIS — I82403 Acute embolism and thrombosis of unspecified deep veins of lower extremity, bilateral: Secondary | ICD-10-CM | POA: Diagnosis not present

## 2015-07-31 DIAGNOSIS — G8110 Spastic hemiplegia affecting unspecified side: Secondary | ICD-10-CM

## 2015-07-31 DIAGNOSIS — I808 Phlebitis and thrombophlebitis of other sites: Secondary | ICD-10-CM

## 2015-07-31 DIAGNOSIS — E0590 Thyrotoxicosis, unspecified without thyrotoxic crisis or storm: Secondary | ICD-10-CM

## 2015-07-31 LAB — CBC WITH DIFFERENTIAL/PLATELET
BASO%: 1.5 % (ref 0.0–2.0)
Basophils Absolute: 0.1 10*3/uL (ref 0.0–0.1)
EOS%: 2.8 % (ref 0.0–7.0)
Eosinophils Absolute: 0.2 10*3/uL (ref 0.0–0.5)
HCT: 45.2 % (ref 34.8–46.6)
HGB: 14.6 g/dL (ref 11.6–15.9)
LYMPH%: 21.3 % (ref 14.0–49.7)
MCH: 30.1 pg (ref 25.1–34.0)
MCHC: 32.3 g/dL (ref 31.5–36.0)
MCV: 93.3 fL (ref 79.5–101.0)
MONO#: 0.5 10*3/uL (ref 0.1–0.9)
MONO%: 7.8 % (ref 0.0–14.0)
NEUT#: 4.2 10*3/uL (ref 1.5–6.5)
NEUT%: 66.6 % (ref 38.4–76.8)
Platelets: 702 10*3/uL — ABNORMAL HIGH (ref 145–400)
RBC: 4.84 10*6/uL (ref 3.70–5.45)
RDW: 16.6 % — ABNORMAL HIGH (ref 11.2–14.5)
WBC: 6.4 10*3/uL (ref 3.9–10.3)
lymph#: 1.4 10*3/uL (ref 0.9–3.3)

## 2015-07-31 MED ORDER — HYDROXYUREA 500 MG PO CAPS: 500.0000 mg | ORAL_CAPSULE | Freq: Every day | ORAL | 0 refills | Status: DC

## 2015-07-31 NOTE — Progress Notes (Signed)
[facility] CONSULT NOTE  Patient Care Team: [name], MD as PCP - General (Internal Medicine) Eunice Blase, MD (Inactive) as Consulting Physician (Family Medicine) Renato Shin, MD as Consulting Physician (Endocrinology) Netta Cedars, MD as Consulting Physician (Orthopedic Surgery) Lyndal Pulley, DO (Sports Medicine) Garvin Fila, MD (Neurology)  CHIEF COMPLAINTS/PURPOSE OF CONSULTATION:  Bilateral DVT, significant PE, recurrent stroke, thrombocytosis  HISTORY OF PRESENTING ILLNESS:  Sandy Barrett 82 y.o. female is here because of significant thrombocytosis. Due to her problem with speech, majority of the history is obtained through discussing with her daughter and review of chart. On review of her past blood work, she started to have evidence of thrombocytosis starting round August 2015. Over the past 2 years, her platelet count has ran as high as 900,000 range. The patient developed acute stroke, DVT and PE in 2016. Prior to that, she was totally independent. Her daughter stated that she had altered mental status when she was driving to her hairdresser in March 2016 and she could not remember how to turn off her car. She was hospitalized extensively and was diagnosed with bilateral DVT, PE and extensive stroke. She suffered permanent right-sided neurological deficit, speech problems and dysphagia. She also had problems with UTI and pneumonia. Over the causes of past year, she had extensive imaging studies. She had abnormal changes on her chest wall, concern for breast cancer and she underwent mammogram with no parenchymal abnormalities. She underwent skin biopsy to rule out breast cancer. She had prior colonoscopy several years ago that came back benign She is known to have multinodular goiter status post radioactive iodine ablation. The goiter has caused significant trachea displacement and may have contributed to her dysphagia. Summary of her history is as  follows:   Essential thrombocytosis ([place])   08/07/2013 Miscellaneous    The patient is noted to have elevated platelet count     03/08/2014 Imaging    Positive for acute PE with CT evidence of right heart strain (RV/LV Ratio = 0.9) consistent with at least submassive (intermediate risk)PE.      03/09/2014 Imaging    US venous Doppler showed deep vein thrombosis noted in the right distal common femoral vein, femoral vein, and popliteal vein. DVT noted in the left femoral and popliteal veins     03/09/2014 Imaging    Patchy areas of acute left MCA territory infarction. 2. Several punctate foci of acute right MCA territory infarction. Possible trace subarachnoid hemorrhage in the high right frontal lobe. 3. Occluded left ICA and left MCA     03/20/2015 Imaging    Compared to MRI on 03/09/14, there has been expected evolutional change of left MCA infarction. In addition, there may be a few areas of acute-subacute infarcts in the left basal ganglia vs artifact.       MEDICAL HISTORY:  Past Medical History:  Diagnosis Date  . ARTHRITIS   . Arthritis   . Diverticulosis   . HYPERTENSION   . HYPERTHYROIDISM   . Hyperthyroidism    s/p I-131 ablation 03/2011 of multinod goiter  . INSOMNIA   . MIGRAINE HEADACHE   . OBESITY   . Posttraumatic stress disorder   . Pulmonary embolism ([place]) 03/2014   with DVT  . Stroke [facility]) 03/2014   dysarthria    SURGICAL HISTORY: Past Surgical History:  Procedure Laterality Date  . ABDOMINAL HYSTERECTOMY  1976  . BREAST SURGERY     biopsy  . RADIOLOGY WITH ANESTHESIA Left 03/08/2014  Procedure: RADIOLOGY WITH ANESTHESIA;  Surgeon: Rob Hickman, MD;  Location: [facility];  Service: Radiology;  Laterality: Left;  . TONSILLECTOMY    . TOTAL HIP ARTHROPLASTY  1998    right    SOCIAL HISTORY: Social History   Social History  . Marital status: Widowed    Spouse name: N/A  . Number of children: 2  . Years of education: 14   Occupational History  . retired      Restaurant manager, fast food   Social History Main Topics  . Smoking status: Never Smoker  . Smokeless tobacco: Never Used  . Alcohol use No  . Drug use: No  . Sexual activity: No   Other Topics Concern  . Not on file   Social History Narrative   Widowed, lived alone prior to CVA 03/2014   SNF at [facility], then home 07/2014 with dtr   Right handed   Caffeine use- occasionally drinks tea    FAMILY HISTORY: Family History  Problem Relation Age of Onset  . Asthma Mother   . Asthma Father   . Prostate cancer Father   . Stroke Brother   . Thyroid disease Neg Hx     ALLERGIES:  has No Known Allergies.  MEDICATIONS:  Current Outpatient Prescriptions  Medication Sig Dispense Refill  . acetaminophen (TYLENOL) 325 MG tablet Take 1-2 tablets (325-650 mg total) by mouth every 4 (four) hours as needed for mild pain.    [name] albuterol (PROVENTIL HFA) 108 (90 Base) MCG/ACT inhaler Inhale 2 puffs into the lungs every 6 (six) hours as needed for wheezing or shortness of breath. 1 Inhaler 1  . diclofenac sodium (VOLTAREN) 1 % GEL APPLY (2GMS) TOPICALLY THREE TIMES DAILY. (Patient taking differently: APPLY (2GMS) TOPICALLY THREE TIMES DAILY AS NEEDED FOR PAIN) 300 g 3  . gabapentin (NEURONTIN) 100 MG capsule TAKE 1 CAPSULE (100 MG TOTAL) BY MOUTH 3 (THREE) TIMES DAILY. 90 capsule 5  . methimazole (TAPAZOLE) 5 MG tablet TAKE 1 TABLET 3 TIMES A WEEK 12 tablet 2  . Multiple Vitamins-Minerals (DECUBI-VITE) CAPS Take 1 capsule by mouth daily.     . pantoprazole sodium (PROTONIX) 40 mg/20 mL PACK Take 20 mLs (40 mg total) by mouth daily. 30 each 5  . polyethylene glycol (MIRALAX) packet Take 17 g by mouth 2 (two) times daily. 14 each 0  . Probiotic Product (ALIGN) 4 MG CAPS Take 1 capsule by mouth daily.     [name] terconazole (TERAZOL 7) 0.4 % vaginal cream Place 1 applicator vaginally at bedtime. 90 g 3  . traMADol (ULTRAM) 50 MG tablet TAKE 1 TO 2 TABLETS BY MOUTH 3 TIMES DAILY AS NEEDED (Patient  taking differently: TAKE 1 TO 2 TABLETS BY MOUTH 3 TIMES DAILY AS NEEDED FOR PAIN) 180 tablet 1  . traZODone (DESYREL) 50 MG tablet TAKE 1 TABLET (50 MG TOTAL) BY MOUTH AT BEDTIME AS NEEDED FOR SLEEP. 30 tablet 5  . warfarin (COUMADIN) 4 MG tablet Take 6 mg (1 tablet) on Mon/Wed/Fri. And take 4 mg (1 1/2 tablets) on Sun/Tues/Thurs/Sat. Or as directed. (Patient taking differently: Take 4-6 mg by mouth daily. Take 6 mg on Mon/Wed/Fri/Sat. And take 4 mg on Sun/Tues/Thurs/. Or as directed.) 40 tablet 3  . hydroxyurea (HYDREA) 500 MG capsule Take 1 capsule (500 mg total) by mouth daily. May take with food to minimize GI side effects. 30 capsule 0  . propranolol (INDERAL) 10 MG tablet Take 1 tablet (10 mg total) by mouth 2 (  two) times daily. 60 tablet 3   Current Facility-Administered Medications  Medication Dose Route Frequency Provider Last Rate Last Dose  . albuterol (PROVENTIL) (2.5 MG/3ML) 0.083% nebulizer solution 2.5 mg  2.5 mg Nebulization Once Burnard Hawthorne, FNP        REVIEW OF SYSTEMS:  Unable to obtain directly from the patient PHYSICAL EXAMINATION: ECOG PERFORMANCE STATUS: 2 - Symptomatic, <50% confined to bed  Vitals:   07/31/15 1441  BP: (!) 108/59  Pulse: 77  Resp: 18  Temp: 98.2 F (36.8 C)   Filed Weights    GENERAL:alert, no distress and comfortable SKIN: Noted abnormal skin changes on her chest wall EYES: normal, conjunctiva are pink and non-injected, sclera clear OROPHARYNX:no exudate, no erythema and lips, buccal mucosa, and tongue normal  NECK: supple, thyroid normal size, non-tender, without nodularity LYMPH:  no palpable lymphadenopathy in the cervical, axillary or inguinal LUNGS: clear to auscultation and percussion with normal breathing effort HEART: regular rate & rhythm and no murmurs and no lower extremity edema ABDOMEN:abdomen soft, non-tender and normal bowel sounds Musculoskeletal:no cyanosis of digits and no clubbing  PSYCH: alert with  dysphasia NEURO: Unable to examine min the patient. She is sitting on the wheelchair. She has obvious right-sided weakness and speech disturbance  LABORATORY DATA:  I have reviewed the data as listed Lab Results  Component Value Date   WBC 6.4 07/31/2015   HGB 14.6 07/31/2015   HCT 45.2 07/31/2015   MCV 93.3 07/31/2015   PLT 702 (H) 07/31/2015    Recent Labs  12/25/14 1742 06/26/15 1559 06/27/15 0454 07/19/15 1620  NA 142 139 139 139  K 4.6 3.6 3.9 3.7  CL 109 106 108 104  CO2 _0 GLUCOSE 84 88 85 78  BUN _1 CREATININE 0.38* 0.30* 0.44 0.39*  CALCIUM 9.2 9.3 9.1 9.5  GFRNONAA >60  --  >60  --   GFRAA >60  --  >60  --   PROT  --  6.6 5.6* 6.8  ALBUMIN  --  3.5 2.9* 3.6  AST  --  49* 128* 16  ALT  --  253* 233* 49*  ALKPHOS  --  180* 193* 125*  BILITOT  --  1.7* 2.8* 1.5*    RADIOGRAPHIC STUDIES: I have personally reviewed the radiological images as listed and agreed with the findings in the report. Ct Soft Tissue Neck Wo Contrast  Result Date: 07/02/2015 CLINICAL DATA:  Possible neck veins swelling, now improving. History of stroke, RIGHT-sided weakness, aphasia. Recent bowel obstruction and urinary tract infection, constipation. EXAM: CT NECK WITHOUT CONTRAST TECHNIQUE: Multidetector CT imaging of the neck was performed following the standard protocol without intravenous contrast. COMPARISON:  MRI brain March 20, 2015 and CT chest May 31, 2015 FINDINGS: Pharynx and larynx: Pharynx is normal. Larynx and proximal trachea displaced to the RIGHT due to thyroid goiter. Salivary glands: Normal. Thyroid: Markedly enlarged heterogeneous thyroid with multiple nodules and, scattered calcifications, substernal extent displaces the trachea to the RIGHT, effaces the superior vena cava. Lymph nodes: No definite lymphadenopathy though limited by varicosities in noncontrast examination. Vascular: Calcification along the course the LEFT internal jugular vein. Limited  intracranial: LEFT temporal lobe encephalomalacia corresponding to known old LEFT MCA territory infarct. Visualized orbits: Normal. Mastoids and visualized paranasal sinuses: Trace paranasal sinus mucosal thickening without air-fluid levels. Prominent in size foramen. Mastoid air cells are well aerated. Skeleton: Multiple subcentimeter calcifications LEFT infra spinatus muscle most consistent with  phleboliths responding to non mass. No destructive bony lesions. Upper chest: Findings are stable from prior CT chest. Varicosities within the chest wall, and proximal neck. IMPRESSION: Severe, stable thyromegaly corresponding to known multi nodular goiter. Given extensive varicosities throughout the chest and neck, this is most compatible with chronic venous obstruction/SVC syndrome. Calcifications LEFT internal jugular vein most compatible with chronic thrombosis. No acute process in the neck.  Patent airway. Electronically Signed   By: Elon Alas M.D.   On: 07/02/2015 00:12    ASSESSMENT & PLAN:  Essential thrombocytosis (HCC) Overall, I suspect the patient may have undiagnosed myeloproliferative disorder, likely essential thrombocytosis versus polycythemia vera. Given her age, comorbidities and long-term anticoagulation therapy, we might not be able to pursue bone marrow aspirate and biopsy. If her peripheral blood test for JAK2, MPL and CAL-R mutation were positive, I am comfortable to forego bone marrow biopsy Given the urgency of the situation and her significant risks of recurrent thrombosis, I would obtain consent from her daughter who is her caregiver to proceed and start her on hydroxyurea. The risks, benefits, side effects of hydroxyurea are fully discussed with her and she agreed to proceed. She was at 500 mg daily along with her chronic anticoagulation therapy. She will return in 2 weeks to review test results  Spastic hemiparesis affecting dominant side ([place]), right She had history of  recurrent stroke in the past. She is currently on medical management and chronic anticoagulation therapy. The patient is debilitated and is dependent on her daughter for all activities of daily living  History of pulmonary embolism The patient had history of extensive PE and DVT. She will remain on chronic anticoagulation therapy indefinitely  DVT of lower extremity, bilateral ([place]) The patient had history of extensive PE and DVT. She will remain on chronic anticoagulation therapy indefinitely  Hyperthyroidism She had received radioactive iodine in the past  Dysphagia She had dysphagia likely due to either a stroke or goitre. She will undergo further evaluation for this  Thrombophlebitis of breast, right She had underwent extensive thinning evaluation with CT, mammogram and skin biopsy which excluded breast cancer  Orders Placed This Encounter  Procedures  . CBC with Differential/Platelet    Standing Status:   Future    Number of Occurrences:   1    Standing Expiration Date:   09/03/2016  . Erythropoietin    Standing Status:   Future    Number of Occurrences:   1    Standing Expiration Date:   09/03/2016  . JAK2 genotypr    Standing Status:   Future    Number of Occurrences:   1    Standing Expiration Date:   09/03/2016  . MPL515 mutation (only if [id number] neg)    Standing Status:   Future    Number of Occurrences:   1    Standing Expiration Date:   09/03/2016  . CALR Mutation ([facility])    Standing Status:   Future    Number of Occurrences:   1    Standing Expiration Date:   09/03/2016     All questions were answered. The patient knows to call the clinic with any problems, questions or concerns. I spent 55 minutes counseling the patient face to face. The total time spent in the appointment was 60 minutes and more than 50% was on counseling.     [facility], Masha Orbach, MD 07/31/2015 5:34 PM

## 2015-07-31 NOTE — Telephone Encounter (Signed)
Gave pt cal & avs °

## 2015-08-01 ENCOUNTER — Ambulatory Visit: Payer: Medicare Other | Admitting: Hematology and Oncology

## 2015-08-01 ENCOUNTER — Encounter: Payer: Self-pay | Admitting: Hematology and Oncology

## 2015-08-01 LAB — ERYTHROPOIETIN: Erythropoietin: 3.8 m[IU]/mL (ref 2.6–18.5)

## 2015-08-01 NOTE — Assessment & Plan Note (Signed)
The patient had history of extensive PE and DVT. She will remain on chronic anticoagulation therapy indefinitely

## 2015-08-01 NOTE — Assessment & Plan Note (Signed)
Overall, I suspect the patient may have undiagnosed myeloproliferative disorder, likely essential thrombocytosis versus polycythemia vera. Given her age, comorbidities and long-term anticoagulation therapy, we might not be able to pursue bone marrow aspirate and biopsy. If her peripheral blood test for JAK2, MPL and CAL-R mutation were positive, I am comfortable to forego bone marrow biopsy Given the urgency of the situation and her significant risks of recurrent thrombosis, I would obtain consent from her daughter who is her caregiver to proceed and start her on hydroxyurea. The risks, benefits, side effects of hydroxyurea are fully discussed with her and she agreed to proceed. She was at 500 mg daily along with her chronic anticoagulation therapy. She will return in 2 weeks to review test results

## 2015-08-01 NOTE — Assessment & Plan Note (Signed)
She had received radioactive iodine in the past

## 2015-08-01 NOTE — Assessment & Plan Note (Signed)
She had underwent extensive thinning evaluation with CT, mammogram and skin biopsy which excluded breast cancer

## 2015-08-01 NOTE — Assessment & Plan Note (Signed)
She had history of recurrent stroke in the past. She is currently on medical management and chronic anticoagulation therapy. The patient is debilitated and is dependent on her daughter for all activities of daily living

## 2015-08-01 NOTE — Assessment & Plan Note (Signed)
She had dysphagia likely due to either a stroke or goitre. She will undergo further evaluation for this

## 2015-08-02 ENCOUNTER — Ambulatory Visit (INDEPENDENT_AMBULATORY_CARE_PROVIDER_SITE_OTHER): Payer: Medicare Other | Admitting: Endocrinology

## 2015-08-02 ENCOUNTER — Encounter: Payer: Self-pay | Admitting: Endocrinology

## 2015-08-02 ENCOUNTER — Telehealth: Payer: Self-pay | Admitting: Endocrinology

## 2015-08-02 VITALS — BP 100/60 | HR 64

## 2015-08-02 DIAGNOSIS — E0590 Thyrotoxicosis, unspecified without thyrotoxic crisis or storm: Secondary | ICD-10-CM | POA: Diagnosis not present

## 2015-08-02 DIAGNOSIS — I639 Cerebral infarction, unspecified: Secondary | ICD-10-CM

## 2015-08-02 LAB — TSH: TSH: 2.39 u[IU]/mL (ref 0.35–4.50)

## 2015-08-02 LAB — T4, FREE: Free T4: 1.54 ng/dL (ref 0.60–1.60)

## 2015-08-02 MED ORDER — METHIMAZOLE 5 MG PO TABS: 5.0000 mg | ORAL_TABLET | ORAL | 2 refills | Status: DC

## 2015-08-02 NOTE — Progress Notes (Signed)
Subjective:    Patient ID: Sandy Barrett, female    DOB: November 16, 1932, 82 y.o.   MRN: [id number]  HPI Pt returns for f/u of hyperthyroidism (due to a multinodular goiter; she had RAI in 2013; hyperthyroidism resolved, but it recurred in 2016; she has never had bx; she had a CVA in 2016, so she is not a surgical candidate, and can't be isolated for another dose of RAI).  She had few weeks of slight dysphagia (solid=liquid) dysphagia, and assoc cough.  sxs are much better.  dtr says improvement might have been due to increasing PPI rx.   Past Medical History:  Diagnosis Date  . ARTHRITIS   . Arthritis   . Diverticulosis   . HYPERTENSION   . HYPERTHYROIDISM   . Hyperthyroidism    s/p I-131 ablation 03/2011 of multinod goiter  . INSOMNIA   . MIGRAINE HEADACHE   . OBESITY   . Posttraumatic stress disorder   . Pulmonary embolism ([place]) 03/2014   with DVT  . Stroke [facility]) 03/2014   dysarthria    Past Surgical History:  Procedure Laterality Date  . ABDOMINAL HYSTERECTOMY  1976  . BREAST SURGERY     biopsy  . RADIOLOGY WITH ANESTHESIA Left 03/08/2014   Procedure: RADIOLOGY WITH ANESTHESIA;  Surgeon: Rob Hickman, MD;  Location: [facility];  Service: Radiology;  Laterality: Left;  . TONSILLECTOMY    . TOTAL HIP ARTHROPLASTY  1998    right    Social History   Social History  . Marital status: Widowed    Spouse name: N/A  . Number of children: 2  . Years of education: 14   Occupational History  . retired     Restaurant manager, fast food   Social History Main Topics  . Smoking status: Never Smoker  . Smokeless tobacco: Never Used  . Alcohol use No  . Drug use: No  . Sexual activity: No   Other Topics Concern  . Not on file   Social History Narrative   Widowed, lived alone prior to CVA 03/2014   SNF at [facility], then home 07/2014 with dtr   Right handed   Caffeine use- occasionally drinks tea    Current Outpatient Prescriptions on File Prior to Visit  Medication Sig  Dispense Refill  . acetaminophen (TYLENOL) 325 MG tablet Take 1-2 tablets (325-650 mg total) by mouth every 4 (four) hours as needed for mild pain.    [name] albuterol (PROVENTIL HFA) 108 (90 Base) MCG/ACT inhaler Inhale 2 puffs into the lungs every 6 (six) hours as needed for wheezing or shortness of breath. 1 Inhaler 1  . diclofenac sodium (VOLTAREN) 1 % GEL APPLY (2GMS) TOPICALLY THREE TIMES DAILY. (Patient taking differently: APPLY (2GMS) TOPICALLY THREE TIMES DAILY AS NEEDED FOR PAIN) 300 g 3  . gabapentin (NEURONTIN) 100 MG capsule TAKE 1 CAPSULE (100 MG TOTAL) BY MOUTH 3 (THREE) TIMES DAILY. 90 capsule 5  . hydroxyurea (HYDREA) 500 MG capsule Take 1 capsule (500 mg total) by mouth daily. May take with food to minimize GI side effects. 30 capsule 0  . Multiple Vitamins-Minerals (DECUBI-VITE) CAPS Take 1 capsule by mouth daily.     . pantoprazole sodium (PROTONIX) 40 mg/20 mL PACK Take 20 mLs (40 mg total) by mouth daily. 30 each 5  . polyethylene glycol (MIRALAX) packet Take 17 g by mouth 2 (two) times daily. 14 each 0  . Probiotic Product (ALIGN) 4 MG CAPS Take 1 capsule by mouth daily.     [name]  propranolol (INDERAL) 10 MG tablet Take 1 tablet (10 mg total) by mouth 2 (two) times daily. 60 tablet 3  . terconazole (TERAZOL 7) 0.4 % vaginal cream Place 1 applicator vaginally at bedtime. 90 g 3  . traMADol (ULTRAM) 50 MG tablet TAKE 1 TO 2 TABLETS BY MOUTH 3 TIMES DAILY AS NEEDED (Patient taking differently: TAKE 1 TO 2 TABLETS BY MOUTH 3 TIMES DAILY AS NEEDED FOR PAIN) 180 tablet 1  . traZODone (DESYREL) 50 MG tablet TAKE 1 TABLET (50 MG TOTAL) BY MOUTH AT BEDTIME AS NEEDED FOR SLEEP. 30 tablet 5  . warfarin (COUMADIN) 4 MG tablet Take 6 mg (1 tablet) on Mon/Wed/Fri. And take 4 mg (1 1/2 tablets) on Sun/Tues/Thurs/Sat. Or as directed. (Patient taking differently: Take 4-6 mg by mouth daily. Take 6 mg on Mon/Wed/Fri/Sat. And take 4 mg on Sun/Tues/Thurs/. Or as directed.) 40 tablet 3   Current  Facility-Administered Medications on File Prior to Visit  Medication Dose Route Frequency Provider Last Rate Last Dose  . albuterol (PROVENTIL) (2.5 MG/3ML) 0.083% nebulizer solution 2.5 mg  2.5 mg Nebulization Once Burnard Hawthorne, FNP        No Known Allergies  Family History  Problem Relation Age of Onset  . Asthma Mother   . Asthma Father   . Prostate cancer Father   . Stroke Brother   . Breast cancer Sister   . Stomach cancer Sister   . Thyroid disease Neg Hx     BP 100/60   Pulse 64   SpO2 97%    Review of Systems Denies neck pain    Objective:   Physical Exam VITAL SIGNS:  See vs page.   GENERAL: no distress.  In wheelchair.  NECK: I think I can only feel the top of the goiter (R>L).    Lab Results  Component Value Date   TSH 2.39 08/02/2015   T4TOTAL 7.6 08/21/2014      Assessment & Plan:  Hyperthyroidism: well-controlled.  reduce the methimazole to 1 pill, twice a week.   Dysphagia, (R>L): slightly worse, but unlikely thyroid-related.     Patient is advised the following: Patient Instructions  blood tests are requested for you today.  We'll let you know about the results.  Please do the swallowing test as scheduled.  It is unlikely that this symptom would be due to the thyroid.  If it is, you cannot safely undergo surgery.   The only opion would be the radioactive iodine.  Even that is difficult, because you wold need to be alone for a few days, so family would need to be there the absolute minimum time.   if ever you have fever while taking methimazole, stop it and call us, even if the reason is obvious, because of the risk of a rare side-effect. Please come back for a follow-up appointment in 4 months.    Renato Shin, MD

## 2015-08-02 NOTE — Telephone Encounter (Signed)
error 

## 2015-08-02 NOTE — Patient Instructions (Addendum)
blood tests are requested for you today.  We'll let you know about the results.  Please do the swallowing test as scheduled.  It is unlikely that this symptom would be due to the thyroid.  If it is, you cannot safely undergo surgery.   The only opion would be the radioactive iodine.  Even that is difficult, because you wold need to be alone for a few days, so family would need to be there the absolute minimum time.   if ever you have fever while taking methimazole, stop it and call us, even if the reason is obvious, because of the risk of a rare side-effect. Please come back for a follow-up appointment in 4 months.

## 2015-08-05 ENCOUNTER — Telehealth: Payer: Self-pay | Admitting: Emergency Medicine

## 2015-08-05 NOTE — Telephone Encounter (Signed)
Spoke to pts daughter, states that pt would have to be cathed to get specimen. They are going to try urgent care, [facility] would not do a one time visit.

## 2015-08-05 NOTE — Telephone Encounter (Signed)
Ideally she should give a urine sample to make sure it is a UTI and not a yeast infection since the treatments are different.  Can she bring in a urine sample?

## 2015-08-05 NOTE — Telephone Encounter (Signed)
pts daughter called in to inform that pt may have a UTI, she has a strong odor to her urine and is itching when they are trying to change her. They are currently using vaginal cream and does not seem to be giving pt full relief.

## 2015-08-06 ENCOUNTER — Telehealth: Payer: Self-pay | Admitting: Endocrinology

## 2015-08-06 ENCOUNTER — Ambulatory Visit: Payer: Medicare Other | Attending: Internal Medicine | Admitting: Physical Therapy

## 2015-08-06 VITALS — BP 110/68

## 2015-08-06 DIAGNOSIS — R2681 Unsteadiness on feet: Secondary | ICD-10-CM | POA: Diagnosis not present

## 2015-08-06 DIAGNOSIS — I69351 Hemiplegia and hemiparesis following cerebral infarction affecting right dominant side: Secondary | ICD-10-CM | POA: Diagnosis not present

## 2015-08-06 DIAGNOSIS — R2689 Other abnormalities of gait and mobility: Secondary | ICD-10-CM | POA: Diagnosis not present

## 2015-08-06 NOTE — Therapy (Signed)
Sandy Barrett [address] [place], [place], [address] Phone: [phone]   Fax:  [phone]  Physical Therapy Treatment  Patient Details  Name: Sandy Barrett MRN: [id number] Date of Birth: April 21, 1932 Referring Provider: [name], MD  Encounter Date: 08/06/2015      PT End of Session - 08/06/15 1407    Visit Number 2   Number of Visits 9   Date for PT Re-Evaluation 09/06/15   Authorization Type Medicare Traditional primary; BCBS secondary   Authorization Time Period G Codes required   PT Start Time 1318   PT Stop Time 1356   PT Time Calculation (min) 38 min   Equipment Utilized During Treatment Other (comment)  standing frame   Activity Tolerance Patient tolerated treatment well;Patient limited by pain  apparent discomfort; increased tone in RUE after standing x9.5 minutes   Behavior During Therapy Anxious  required encouragement, reassurance during transfers, standing      Past Medical History:  Diagnosis Date  . ARTHRITIS   . Arthritis   . Diverticulosis   . HYPERTENSION   . HYPERTHYROIDISM   . Hyperthyroidism    s/p I-131 ablation 03/2011 of multinod goiter  . INSOMNIA   . MIGRAINE HEADACHE   . OBESITY   . Posttraumatic stress disorder   . Pulmonary embolism ([place]) 03/2014   with DVT  . Stroke [facility]) 03/2014   dysarthria    Past Surgical History:  Procedure Laterality Date  . ABDOMINAL HYSTERECTOMY  1976  . BREAST SURGERY     biopsy  . RADIOLOGY WITH ANESTHESIA Left 03/08/2014   Procedure: RADIOLOGY WITH ANESTHESIA;  Surgeon: Rob Hickman, MD;  Location: [facility];  Service: Radiology;  Laterality: Left;  . TONSILLECTOMY    . TOTAL HIP ARTHROPLASTY  1998    right    Vitals:   08/06/15 1338  BP: 110/68        Subjective Assessment - 08/06/15 1321    Subjective Per daughter, "She (patient) didn't want to stand this morning to move her from the bed to the chair...but she did end up  standing and it was one of her better stands. But usually she doesn't mind standing."   Patient is accompained by: Family member  daughter, [name]   Pertinent History PMH significant for: L MCA CVA (03/2014) with severe R spastic hemiplegia, global aphasia, and apraxia; HTN, HLD hyperthyroidism, OA, PE with DVT (03/2014), R THA.   Patient Stated Goals Per daughter, "Strengthen her legs in a more active way - maybe use the machines (NuStep) and try some standing. I'm not interrested in working on transfers, because I can't physically help her with that at home."   Currently in Pain? No/denies                         [facility] Adult PT Treatment/Exercise - 08/06/15 0001      Transfers   Transfers Squat Pivot Transfers;Sit to Stand;Stand to Sit   Sit to Stand 1: +1 Total assist;From chair/3-in-1;Other (comment)  with Total A of standing frame   Sit to Stand Details Tactile cues for posture;Tactile cues for weight shifting;Verbal cues for precautions/safety  manual stabilization/protection of R shoulder girdle   Squat Pivot Transfers 1: +2 Total assist   Squat Pivot Transfers: Patient Percentage 10%   Squat Pivot Transfer Details (indicate cue type and reason) level transfer from w/c <> mat table with +2A, multimodal cueing for setup, sequencing, full  anterior weight shift.     Ambulation/Gait   Ambulation/Gait No  Pt non-ambulatory     Neuro Re-ed    Neuro Re-ed Details  Standing x9.5 minutes with total A of stanidng frame; mirror anterior to pt for postural awareness; posture grossly midline; however, cueing provided to promote thoracic spine/B hip extension for upright posture. Vital signs WNL (see vitals for details). Pt tolerated standing frame well until final 45-60 seconds, when pt became visibly uncomfortable (increased spasticity in R hand noted). Standing trial ended due to pt discomfort.                  PT Short Term Goals - 08/06/15 1409      PT SHORT TERM  GOAL #1   Title Patient will participate in > 75% of initial 2 PT sessions to indicate patient is an appropriate candidate for outpatient PT.  (Target date: 08/13/15)   Status On-going     PT SHORT TERM GOAL #2   Title Patient will tolerate standing with total A of standing frame for >/= 5 miinutes to indicate increased standing tolerance. (08/13/15)   Baseline 8/1: tolerated standing with total A of standing frame x9.5 minutes   Status Achieved     PT SHORT TERM GOAL #3   Title Pt will perform NuStep for >/= 5 minutes (lower extremities only) to indicate increased activity tolerance.  (08/13/15)   Status On-going           PT Long Term Goals - 07/23/15 2006      PT LONG TERM GOAL #1   Title Patient will tolerate standing with total A of standing frame for >/= 15 miinutes to indicate significant improvment in standing tolerance.  (Target date: 09/02/15)     PT LONG TERM GOAL #2   Title Pt will perform NuStep for >/= 10 minutes (lower extremities only) to indicate increased activity tolerance.  (09/02/15)     PT LONG TERM GOAL #3   Title Pt will participate in standing with use of body weight support system to achieve family's goal of increasing supported standing tolerance.  (09/02/15)               Plan - 08/06/15 1409    Clinical Impression Statement Session focused on trialing standing frame for increased standing tolerance, spasticity management, postural control. Pt tolerated standing with total A of standing frame x9.5 minutes with vital signs WNL. Standing ended due to onset of pt discomfort, as exhibited by facial grimacing and onset of significant increase in RUE tone.   Rehab Potential Fair   Clinical Impairments Affecting Rehab Potential chronicity of R hemiplegia; R inattention; global aphasia; and chronic knee pain   PT Frequency --  8 total sessions   PT Duration 6 weeks   PT Treatment/Interventions ADLs/Self Care Home Management;Stair training;Neuromuscular  re-education;Functional mobility training;Patient/family education;Therapeutic activities;Balance training;Orthotic Fit/Training;Therapeutic exercise   PT Next Visit Plan Attempt use of NuStep   Consulted and Agree with Plan of Care Patient;Family member/caregiver   Family Member Consulted daughter, [name]      Patient will benefit from skilled therapeutic intervention in order to improve the following deficits and impairments:  Decreased cognition, Pain, Other (comment), Decreased balance, Decreased activity tolerance, Decreased endurance, Decreased coordination, Decreased mobility, Impaired sensation, Impaired tone, Impaired UE functional use, Decreased strength, Postural dysfunction (Pain will be monitored but not directly addressed by PT due to nature of referral)  Visit Diagnosis: Hemiplegia and hemiparesis following cerebral infarction affecting right dominant  side ([place])  Other abnormalities of gait and mobility     Problem List Patient Active Problem List   Diagnosis Date Noted  . Essential thrombocytosis ([place]) 07/31/2015  . SVC (superior vena cava obstruction), chronic 07/20/2015  . Dysphagia 07/20/2015  . Elevated liver enzymes 07/20/2015  . Constipation 07/20/2015  . Thrombophlebitis of breast, right 06/18/2015  . Tracheal deviation 06/06/2015  . Sensorineural hearing loss, bilateral, moderate-moderately severe 04/30/2015  . Abdominal wall lump 03/09/2015  . Frequent UTI 02/06/2015  . Primary osteoarthritis involving multiple joints 07/26/2014  . Pernicious anemia 07/26/2014  . Hearing loss 07/26/2014  . Encounter for therapeutic drug monitoring 07/17/2014  . Spastic hemiparesis affecting dominant side ([place]), right 05/31/2014  . DVT of lower extremity, bilateral ([place]) 03/14/2014  . Global aphasia 03/14/2014  . 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 Aphasia due to stroke 03/13/2014  . Cerebral infarction due to embolism of left middle cerebral artery ([place])   . Stroke,  embolic ([place]) 123456  . History of pulmonary embolism 03/08/2014  . Back pain 08/29/2013  . Primary localized osteoarthrosis, lower leg 03/06/2013  . IBS (irritable bowel syndrome)   . Multinodular goiter 01/13/2011  . Hyperthyroidism 11/11/2009  . Insomnia 07/03/2009  . POSTTRAUMATIC STRESS DISORDER 08/22/2008  . OBESITY 04/21/2008  . Osteoarthritis 04/21/2008  . MIGRAINE HEADACHE 04/20/2008  . Essential hypertension 04/20/2008    Sandy Barrett, PT, DPT [facility] [address] [place], [place], [address] Phone: [phone]   Fax:  [phone] 08/06/15, 2:12 PM  Name: Sandy Barrett MRN: [id number] Date of Birth: July 02, 1932

## 2015-08-06 NOTE — Telephone Encounter (Signed)
Patient daughter stated that patient has a prescription ready that she she don't know about. Please advise

## 2015-08-07 ENCOUNTER — Telehealth: Payer: Self-pay

## 2015-08-07 ENCOUNTER — Other Ambulatory Visit: Payer: Self-pay | Admitting: Obstetrics & Gynecology

## 2015-08-07 ENCOUNTER — Telehealth: Payer: Self-pay | Admitting: *Deleted

## 2015-08-07 MED ORDER — FLUCONAZOLE 150 MG PO TABS: 150.0000 mg | ORAL_TABLET | Freq: Every day | ORAL | 2 refills | Status: DC

## 2015-08-07 NOTE — Telephone Encounter (Signed)
-----   Message from Renato Shin, MD sent at 08/02/2015  5:04 PM EDT ----- please call patient's dtr: Normal again Please reduce the methimazole to 1 pill, twice a week I'll see you next time.

## 2015-08-07 NOTE — Telephone Encounter (Signed)
Called pt and spoke w/pt's daughter teresa. She stated that [name] has had a stroke and is unable to speak on the phone.

## 2015-08-07 NOTE — Telephone Encounter (Signed)
Called and spoke with patient daughter about normal lab results, advised of medication changes and advised to call back if any issues.

## 2015-08-08 ENCOUNTER — Ambulatory Visit (HOSPITAL_COMMUNITY)
Admission: RE | Admit: 2015-08-08 | Discharge: 2015-08-08 | Disposition: A | Discharge status: Home / Self Care | Payer: Medicare Other | Source: Ambulatory Visit | Attending: Internal Medicine | Admitting: Internal Medicine

## 2015-08-08 DIAGNOSIS — K0889 Other specified disorders of teeth and supporting structures: Secondary | ICD-10-CM | POA: Insufficient documentation

## 2015-08-08 DIAGNOSIS — M1990 Unspecified osteoarthritis, unspecified site: Secondary | ICD-10-CM | POA: Insufficient documentation

## 2015-08-08 DIAGNOSIS — E0590 Thyrotoxicosis, unspecified without thyrotoxic crisis or storm: Secondary | ICD-10-CM | POA: Diagnosis not present

## 2015-08-08 DIAGNOSIS — K5790 Diverticulosis of intestine, part unspecified, without perforation or abscess without bleeding: Secondary | ICD-10-CM | POA: Insufficient documentation

## 2015-08-08 DIAGNOSIS — Z8673 Personal history of transient ischemic attack (TIA), and cerebral infarction without residual deficits: Secondary | ICD-10-CM | POA: Diagnosis not present

## 2015-08-08 DIAGNOSIS — Z86711 Personal history of pulmonary embolism: Secondary | ICD-10-CM | POA: Insufficient documentation

## 2015-08-08 DIAGNOSIS — Z9889 Other specified postprocedural states: Secondary | ICD-10-CM | POA: Insufficient documentation

## 2015-08-08 DIAGNOSIS — R1310 Dysphagia, unspecified: Secondary | ICD-10-CM

## 2015-08-08 DIAGNOSIS — R471 Dysarthria and anarthria: Secondary | ICD-10-CM | POA: Diagnosis not present

## 2015-08-08 DIAGNOSIS — I10 Essential (primary) hypertension: Secondary | ICD-10-CM | POA: Insufficient documentation

## 2015-08-08 DIAGNOSIS — R1313 Dysphagia, pharyngeal phase: Secondary | ICD-10-CM | POA: Diagnosis not present

## 2015-08-08 DIAGNOSIS — E669 Obesity, unspecified: Secondary | ICD-10-CM | POA: Diagnosis not present

## 2015-08-08 DIAGNOSIS — R1311 Dysphagia, oral phase: Secondary | ICD-10-CM | POA: Diagnosis not present

## 2015-08-08 DIAGNOSIS — F4310 Post-traumatic stress disorder, unspecified: Secondary | ICD-10-CM | POA: Insufficient documentation

## 2015-08-08 DIAGNOSIS — I69391 Dysphagia following cerebral infarction: Secondary | ICD-10-CM | POA: Diagnosis not present

## 2015-08-09 NOTE — Telephone Encounter (Signed)
Received call from patient daughter [name]. About her mother's vaginal irritation. Per Dr. Ihor Dow, 1 short course of diflucan is not going to bother her being on coumadin. Dr. Ihor Dow spoke with in-house pharmacist.

## 2015-08-12 ENCOUNTER — Other Ambulatory Visit: Payer: Self-pay | Admitting: Internal Medicine

## 2015-08-12 ENCOUNTER — Telehealth: Payer: Self-pay | Admitting: General Practice

## 2015-08-12 NOTE — Telephone Encounter (Signed)
Called patient's daughter [name] and informed her that Dr Ihor Dow spoke with an in house pharmacist who states a short term dose of diflucan will not interfere with her warfarin. [name] verbalized understanding and states what if it isn't improving. Told her she could repeat another pill Wednesday evening to Thursday morning. She verbalized understanding & states what if she isn't getting better and it's a UTI. She states it isn't easy for her mother because she is wheelchair bound and has to be catheterized. Told her I would speak with Dr Ihor Dow and call her back. Dr Ihor Dow states the patient could then be worked in for an appt. Called [name] back and told her if after a couple doses of diflucan her mother isn't feeling better, she should call us back so we can work her in for an appt. She verbalized understanding & had no questions at this time

## 2015-08-13 ENCOUNTER — Ambulatory Visit: Payer: Medicare Other | Admitting: Physical Therapy

## 2015-08-13 ENCOUNTER — Encounter: Payer: Self-pay | Admitting: Physical Medicine & Rehabilitation

## 2015-08-13 ENCOUNTER — Encounter: Payer: Medicare Other | Attending: Physical Medicine & Rehabilitation

## 2015-08-13 ENCOUNTER — Ambulatory Visit (HOSPITAL_BASED_OUTPATIENT_CLINIC_OR_DEPARTMENT_OTHER): Payer: Medicare Other | Admitting: Physical Medicine & Rehabilitation

## 2015-08-13 VITALS — BP 111/70 | HR 81 | Resp 14

## 2015-08-13 DIAGNOSIS — M6289 Other specified disorders of muscle: Secondary | ICD-10-CM | POA: Diagnosis not present

## 2015-08-13 DIAGNOSIS — I69320 Aphasia following cerebral infarction: Secondary | ICD-10-CM | POA: Diagnosis not present

## 2015-08-13 DIAGNOSIS — M7541 Impingement syndrome of right shoulder: Secondary | ICD-10-CM | POA: Insufficient documentation

## 2015-08-13 DIAGNOSIS — M1712 Unilateral primary osteoarthritis, left knee: Secondary | ICD-10-CM | POA: Diagnosis not present

## 2015-08-13 DIAGNOSIS — G8190 Hemiplegia, unspecified affecting unspecified side: Secondary | ICD-10-CM | POA: Insufficient documentation

## 2015-08-13 NOTE — Progress Notes (Signed)
Knee injection LEFT  Indication:Left Knee pain not relieved by medication management and other conservative care.  Informed consent was obtained after describing risks and benefits of the procedure with the patient, this includes bleeding, bruising, infection and medication side effects. The patient wishes to proceed and has given written consent. The patient was placed in a recumbent position. The medial aspect of the knee was marked and prepped with Betadine and alcohol. It was then entered with a 25-gauge 1-1/2 inch needle was inserted into the knee joint. After negative draw back for blood, a solution containing one ML of 6mg per mL betamethasone and 3 mL of 1% lidocaine were injected. The patient tolerated the procedure well. Post procedure instructions were given. 

## 2015-08-13 NOTE — Patient Instructions (Signed)
Knee Injection, Care After Refer to this sheet in the next few weeks. These instructions provide you with information about caring for yourself after your procedure. Your health care provider may also give you more specific instructions. Your treatment has been planned according to current medical practices, but problems sometimes occur. Call your health care provider if you have any problems or questions after your procedure. WHAT TO EXPECT AFTER THE PROCEDURE After your procedure, it is common to have:  Soreness.  Warmth.  Swelling. You may have more pain, swelling, and warmth than you did before the injection. This reaction may last for about one day.  HOME CARE INSTRUCTIONS Bathing  If you were given a bandage (dressing), keep it dry until your health care provider says it can be removed. Ask your health care provider when you can start showering or taking a bath. Managing Pain, Stiffness, and Swelling  If directed, apply ice to the injection area:  Put ice in a plastic bag.  Place a towel between your skin and the bag.  Leave the ice on for 20 minutes, 2-3 times per day.  Do not apply heat to your knee.  Raise the injection area above the level of your heart while you are sitting or lying down. Activity  Avoid strenuous activities for as long as directed by your health care provider. Ask your health care provider when you can return to your normal activities. General Instructions  Take medicines only as directed by your health care provider.  Do not take aspirin or other over-the-counter medicines unless your health care provider says you can.  Check your injection site every day for signs of infection. Watch for:  Redness, swelling, or pain.  Fluid, blood, or pus.  Follow your health care provider's instructions about dressing changes and removal. SEEK MEDICAL CARE IF:  You have symptoms at your injection site that last longer than two days after your  procedure.  You have redness, swelling, or pain in your injection area.  You have fluid, blood, or pus coming from your injection site.  You have warmth in your injection area.  You have a fever.  Your pain is not controlled with medicine. SEEK IMMEDIATE MEDICAL CARE IF:  Your knee turns very red.  Your knee becomes very swollen.  Your knee pain is severe.   This information is not intended to replace advice given to you by your health care provider. Make sure you discuss any questions you have with your health care provider.   Document Released: 01/12/2014 Document Reviewed: 01/12/2014 Elsevier Interactive Patient Education 2016 Elsevier Inc.  

## 2015-08-14 ENCOUNTER — Other Ambulatory Visit: Payer: Self-pay | Admitting: Hematology and Oncology

## 2015-08-14 DIAGNOSIS — D473 Essential (hemorrhagic) thrombocythemia: Secondary | ICD-10-CM

## 2015-08-15 ENCOUNTER — Encounter: Payer: Self-pay | Admitting: Hematology and Oncology

## 2015-08-15 ENCOUNTER — Other Ambulatory Visit (HOSPITAL_BASED_OUTPATIENT_CLINIC_OR_DEPARTMENT_OTHER): Payer: Medicare Other

## 2015-08-15 ENCOUNTER — Telehealth: Payer: Self-pay | Admitting: Hematology and Oncology

## 2015-08-15 ENCOUNTER — Ambulatory Visit (HOSPITAL_BASED_OUTPATIENT_CLINIC_OR_DEPARTMENT_OTHER): Payer: Medicare Other | Admitting: Hematology and Oncology

## 2015-08-15 DIAGNOSIS — G8111 Spastic hemiplegia affecting right dominant side: Secondary | ICD-10-CM

## 2015-08-15 DIAGNOSIS — D473 Essential (hemorrhagic) thrombocythemia: Secondary | ICD-10-CM

## 2015-08-15 DIAGNOSIS — Z86711 Personal history of pulmonary embolism: Secondary | ICD-10-CM | POA: Diagnosis not present

## 2015-08-15 DIAGNOSIS — Z7901 Long term (current) use of anticoagulants: Secondary | ICD-10-CM | POA: Diagnosis not present

## 2015-08-15 DIAGNOSIS — G8110 Spastic hemiplegia affecting unspecified side: Secondary | ICD-10-CM

## 2015-08-15 LAB — CBC WITH DIFFERENTIAL/PLATELET
BASO%: 0.2 % (ref 0.0–2.0)
Basophils Absolute: 0 10*3/uL (ref 0.0–0.1)
EOS%: 0.7 % (ref 0.0–7.0)
Eosinophils Absolute: 0.1 10*3/uL (ref 0.0–0.5)
HCT: 45.7 % (ref 34.8–46.6)
HGB: 15 g/dL (ref 11.6–15.9)
LYMPH%: 7.9 % — ABNORMAL LOW (ref 14.0–49.7)
MCH: 30.6 pg (ref 25.1–34.0)
MCHC: 32.8 g/dL (ref 31.5–36.0)
MCV: 93.3 fL (ref 79.5–101.0)
MONO#: 0.5 10*3/uL (ref 0.1–0.9)
MONO%: 3.9 % (ref 0.0–14.0)
NEUT#: 11.3 10*3/uL — ABNORMAL HIGH (ref 1.5–6.5)
NEUT%: 87.3 % — ABNORMAL HIGH (ref 38.4–76.8)
Platelets: 878 10*3/uL — ABNORMAL HIGH (ref 145–400)
RBC: 4.9 10*6/uL (ref 3.70–5.45)
RDW: 17.3 % — ABNORMAL HIGH (ref 11.2–14.5)
WBC: 13 10*3/uL — ABNORMAL HIGH (ref 3.9–10.3)
lymph#: 1 10*3/uL (ref 0.9–3.3)
nRBC: 0 % (ref 0–0)

## 2015-08-15 NOTE — Progress Notes (Signed)
[facility] OFFICE PROGRESS NOTE  Patient Care Team: [name], MD as PCP - General (Internal Medicine) Eunice Blase, MD (Inactive) as Consulting Physician (Family Medicine) Renato Shin, MD as Consulting Physician (Endocrinology) Netta Cedars, MD as Consulting Physician (Orthopedic Surgery) Lyndal Pulley, DO (Sports Medicine) Garvin Fila, MD (Neurology)  SUMMARY OF ONCOLOGIC HISTORY:   Essential thrombocytosis ([place])   08/07/2013 Miscellaneous    The patient is noted to have elevated platelet count     03/08/2014 Imaging    Positive for acute PE with CT evidence of right heart strain (RV/LV Ratio = 0.9) consistent with at least submassive (intermediate risk)PE.      03/09/2014 Imaging    US venous Doppler showed deep vein thrombosis noted in the right distal common femoral vein, femoral vein, and popliteal vein. DVT noted in the left femoral and popliteal veins     03/09/2014 Imaging    Patchy areas of acute left MCA territory infarction. 2. Several punctate foci of acute right MCA territory infarction. Possible trace subarachnoid hemorrhage in the high right frontal lobe. 3. Occluded left ICA and left MCA     03/20/2015 Imaging    Compared to MRI on 03/09/14, there has been expected evolutional change of left MCA infarction. In addition, there may be a few areas of acute-subacute infarcts in the left basal ganglia vs artifact.       07/31/2015 Pathology Results    Peripheral blood is positive for JAK2 mutation     07/31/2015 -  Chemotherapy    She is started on 500 mg daily Hydrea      INTERVAL HISTORY: Please see below for problem oriented charting. She returns with her daughter. According to the daughter, she tolerated treatment well without any appreciable side effects. The patient denies any recent signs or symptoms of bleeding such as spontaneous epistaxis, hematuria or hematochezia.   REVIEW OF SYSTEMS:   Constitutional: Denies fevers, chills or  abnormal weight loss Eyes: Denies blurriness of vision Ears, nose, mouth, throat, and face: Denies mucositis or sore throat Respiratory: Denies cough, dyspnea or wheezes Cardiovascular: Denies palpitation, chest discomfort or lower extremity swelling Gastrointestinal:  Denies nausea, heartburn or change in bowel habits Skin: Denies abnormal skin rashes Lymphatics: Denies new lymphadenopathy or easy bruising Neurological:Denies numbness, tingling or new weaknesses Behavioral/Psych: Mood is stable, no new changes  All other systems were reviewed with the patient and are negative.  I have reviewed the past medical history, past surgical history, social history and family history with the patient and they are unchanged from previous note.  ALLERGIES:  has No Known Allergies.  MEDICATIONS:  Current Outpatient Prescriptions  Medication Sig Dispense Refill  . acetaminophen (TYLENOL) 325 MG tablet Take 1-2 tablets (325-650 mg total) by mouth every 4 (four) hours as needed for mild pain.    [name] albuterol (PROVENTIL HFA) 108 (90 Base) MCG/ACT inhaler Inhale 2 puffs into the lungs every 6 (six) hours as needed for wheezing or shortness of breath. 1 Inhaler 1  . diclofenac sodium (VOLTAREN) 1 % GEL APPLY (2GMS) TOPICALLY THREE TIMES DAILY. (Patient taking differently: APPLY (2GMS) TOPICALLY THREE TIMES DAILY AS NEEDED FOR PAIN) 300 g 3  . fluconazole (DIFLUCAN) 150 MG tablet Take 1 tablet (150 mg total) by mouth daily. 2 tablet 2  . gabapentin (NEURONTIN) 100 MG capsule Take 1 capsule (100 mg total) by mouth 3 (three) times daily. 90 capsule 5  . hydroxyurea (HYDREA) 500 MG capsule Take 1  capsule (500 mg total) by mouth daily. May take with food to minimize GI side effects. 30 capsule 0  . methimazole (TAPAZOLE) 5 MG tablet Take 1 tablet (5 mg total) by mouth 2 (two) times a week. 12 tablet 2  . Multiple Vitamins-Minerals (DECUBI-VITE) CAPS Take 1 capsule by mouth daily.     . pantoprazole sodium  (PROTONIX) 40 mg/20 mL PACK Take 20 mLs (40 mg total) by mouth daily. 30 each 5  . polyethylene glycol (MIRALAX) packet Take 17 g by mouth 2 (two) times daily. 14 each 0  . Probiotic Product (ALIGN) 4 MG CAPS Take 1 capsule by mouth daily.     . propranolol (INDERAL) 10 MG tablet Take 1 tablet (10 mg total) by mouth 2 (two) times daily. 60 tablet 3  . terconazole (TERAZOL 7) 0.4 % vaginal cream Place 1 applicator vaginally at bedtime. 90 g 3  . traMADol (ULTRAM) 50 MG tablet TAKE 1 TO 2 TABLETS BY MOUTH 3 TIMES DAILY AS NEEDED (Patient taking differently: TAKE 1 TO 2 TABLETS BY MOUTH 3 TIMES DAILY AS NEEDED FOR PAIN) 180 tablet 1  . traZODone (DESYREL) 50 MG tablet Take 1 tablet (50 mg total) by mouth at bedtime as needed. for sleep 30 tablet 5  . warfarin (COUMADIN) 4 MG tablet Take 6 mg (1 tablet) on Mon/Wed/Fri. And take 4 mg (1 1/2 tablets) on Sun/Tues/Thurs/Sat. Or as directed. (Patient taking differently: Take 4-6 mg by mouth daily. Take 6 mg on Mon/Wed/Fri/Sat. And take 4 mg on Sun/Tues/Thurs/. Or as directed.) 40 tablet 3   Current Facility-Administered Medications  Medication Dose Route Frequency Provider Last Rate Last Dose  . albuterol (PROVENTIL) (2.5 MG/3ML) 0.083% nebulizer solution 2.5 mg  2.5 mg Nebulization Once Burnard Hawthorne, FNP        PHYSICAL EXAMINATION: ECOG PERFORMANCE STATUS: 2 - Symptomatic, <50% confined to bed  Vitals:   08/15/15 1327  BP: 131/66  Pulse: 72  Resp: 17  Temp: 98.1 F (36.7 C)   Filed Weights    GENERAL:alert, no distress and comfortable SKIN: skin color, texture, turgor are normal, no rashes or significant lesions EYES: normal, Conjunctiva are pink and non-injected, sclera clear Musculoskeletal:no cyanosis of digits and no clubbing  NEURO: alert With dysarthria and right-sided deficit  LABORATORY DATA:  I have reviewed the data as listed    Component Value Date/Time   NA 139 07/19/2015 1620   K 3.7 07/19/2015 1620   CL 104  07/19/2015 1620   CO2 28 07/19/2015 1620   GLUCOSE 78 07/19/2015 1620   BUN 11 07/19/2015 1620   CREATININE 0.39 (L) 07/19/2015 1620   CALCIUM 9.5 07/19/2015 1620   PROT 6.8 07/19/2015 1620   ALBUMIN 3.6 07/19/2015 1620   AST 16 07/19/2015 1620   ALT 49 (H) 07/19/2015 1620   ALKPHOS 125 (H) 07/19/2015 1620   BILITOT 1.5 (H) 07/19/2015 1620   GFRNONAA >60 06/27/2015 0454   GFRAA >60 06/27/2015 0454    No results found for: SPEP, UPEP  Lab Results  Component Value Date   WBC 13.0 (H) 08/15/2015   NEUTROABS 11.3 (H) 08/15/2015   HGB 15.0 08/15/2015   HCT 45.7 08/15/2015   MCV 93.3 08/15/2015   PLT 878 (H) 08/15/2015      Chemistry      Component Value Date/Time   NA 139 07/19/2015 1620   K 3.7 07/19/2015 1620   CL 104 07/19/2015 1620   CO2 28 07/19/2015 1620   BUN  11 07/19/2015 1620   CREATININE 0.39 (L) 07/19/2015 1620      Component Value Date/Time   CALCIUM 9.5 07/19/2015 1620   ALKPHOS 125 (H) 07/19/2015 1620   AST 16 07/19/2015 1620   ALT 49 (H) 07/19/2015 1620   BILITOT 1.5 (H) 07/19/2015 1620      ASSESSMENT & PLAN:  Essential thrombocytosis ([place]) I had a long discussion with the patient and her daughter. Peripheral blood for JAK2 mutation came back positive.  I suspect the patient may have essential thrombocytosis. She was started on hydroxyurea without significant improvement of her blood count yet. I plan to increase hydroxyurea to 1000 mg daily and reassess next week We discussed prognosis with MPN and we discussed side effects of hydroxyurea in length She will continue on chronic anticoagulation therapy  Spastic hemiparesis affecting dominant side ([place]), right She had history of recurrent stroke in the past. She is currently on medical management and chronic anticoagulation therapy. The patient is debilitated and is dependent on her daughter for all activities of daily living  History of pulmonary embolism The patient had history of extensive PE  and DVT, likely due to undiagnosed essential thrombocytosis She will remain on chronic anticoagulation therapy indefinitely   No orders of the defined types were placed in this encounter.  All questions were answered. The patient knows to call the clinic with any problems, questions or concerns. No barriers to learning was detected. I spent 15 minutes counseling the patient face to face. The total time spent in the appointment was 20 minutes and more than 50% was on counseling and review of test results     [facility], Amaya, MD 08/15/2015 2:27 PM

## 2015-08-15 NOTE — Telephone Encounter (Signed)
Gave relative avs report and appointments for August.  °

## 2015-08-15 NOTE — Assessment & Plan Note (Signed)
She had history of recurrent stroke in the past. She is currently on medical management and chronic anticoagulation therapy. The patient is debilitated and is dependent on her daughter for all activities of daily living

## 2015-08-15 NOTE — Assessment & Plan Note (Addendum)
I had a long discussion with the patient and her daughter. Peripheral blood for JAK2 mutation came back positive.  I suspect the patient may have essential thrombocytosis. She was started on hydroxyurea without significant improvement of her blood count yet. I plan to increase hydroxyurea to 1000 mg daily and reassess next week We discussed prognosis with MPN and we discussed side effects of hydroxyurea in length She will continue on chronic anticoagulation therapy

## 2015-08-15 NOTE — Assessment & Plan Note (Signed)
The patient had history of extensive PE and DVT, likely due to undiagnosed essential thrombocytosis She will remain on chronic anticoagulation therapy indefinitely

## 2015-08-16 ENCOUNTER — Ambulatory Visit: Payer: Medicare Other

## 2015-08-16 ENCOUNTER — Ambulatory Visit: Payer: Medicare Other | Admitting: Physical Therapy

## 2015-08-16 ENCOUNTER — Ambulatory Visit (INDEPENDENT_AMBULATORY_CARE_PROVIDER_SITE_OTHER): Payer: Self-pay | Admitting: General Practice

## 2015-08-16 DIAGNOSIS — R2681 Unsteadiness on feet: Secondary | ICD-10-CM | POA: Diagnosis not present

## 2015-08-16 DIAGNOSIS — I639 Cerebral infarction, unspecified: Secondary | ICD-10-CM

## 2015-08-16 DIAGNOSIS — I2699 Other pulmonary embolism without acute cor pulmonale: Secondary | ICD-10-CM

## 2015-08-16 DIAGNOSIS — I69351 Hemiplegia and hemiparesis following cerebral infarction affecting right dominant side: Secondary | ICD-10-CM | POA: Diagnosis not present

## 2015-08-16 DIAGNOSIS — R2689 Other abnormalities of gait and mobility: Secondary | ICD-10-CM

## 2015-08-16 DIAGNOSIS — Z5181 Encounter for therapeutic drug level monitoring: Secondary | ICD-10-CM

## 2015-08-16 DIAGNOSIS — I82403 Acute embolism and thrombosis of unspecified deep veins of lower extremity, bilateral: Secondary | ICD-10-CM

## 2015-08-16 DIAGNOSIS — I63311 Cerebral infarction due to thrombosis of right middle cerebral artery: Secondary | ICD-10-CM

## 2015-08-16 LAB — POCT INR: INR: 5.5

## 2015-08-16 NOTE — Progress Notes (Signed)
I have reviewed and agree with the plan. 

## 2015-08-16 NOTE — Therapy (Signed)
[facility] [address] [place], [place], [address] Phone: [phone]   Fax:  [phone]  Physical Therapy Treatment  Patient Details  Name: Sandy Barrett MRN: [id number] Date of Birth: 1932-08-29 Referring Provider: [name], MD  Encounter Date: 08/16/2015      PT End of Session - 08/16/15 1420    Visit Number 3   Number of Visits 9   Date for PT Re-Evaluation 09/06/15   Authorization Type Medicare Traditional primary; BCBS secondary   Authorization Time Period G Codes required   PT Start Time 1318   PT Stop Time 1400   PT Time Calculation (min) 42 min   Equipment Utilized During Treatment Gait belt   Activity Tolerance Patient tolerated treatment well   Behavior During Therapy WFL for tasks assessed/performed      Past Medical History:  Diagnosis Date  . ARTHRITIS   . Arthritis   . Diverticulosis   . HYPERTENSION   . HYPERTHYROIDISM   . Hyperthyroidism    s/p I-131 ablation 03/2011 of multinod goiter  . INSOMNIA   . MIGRAINE HEADACHE   . OBESITY   . Posttraumatic stress disorder   . Pulmonary embolism ([place]) 03/2014   with DVT  . Stroke [facility]) 03/2014   dysarthria    Past Surgical History:  Procedure Laterality Date  . ABDOMINAL HYSTERECTOMY  1976  . BREAST SURGERY     biopsy  . RADIOLOGY WITH ANESTHESIA Left 03/08/2014   Procedure: RADIOLOGY WITH ANESTHESIA;  Surgeon: Rob Hickman, MD;  Location: [facility];  Service: Radiology;  Laterality: Left;  . TONSILLECTOMY    . TOTAL HIP ARTHROPLASTY  1998    right    There were no vitals filed for this visit.                       [facility] Adult PT Treatment/Exercise - 08/16/15 0001      Transfers   Transfers Lateral/Scoot Transfers   Lateral/Scoot Transfers 1: +2 Total assist   Lateral Transfers: Patient Percentage 10%   Lateral/Scoot Transfer Details (indicate cue type and reason) unlevel transfer from w/c <> NuStep with  +2A, multimodal cueing for LE placement, full anterior weight shift.     Ambulation/Gait   Ambulation/Gait No  Pt non-ambulatory       Neuro Re-ed    Neuro Re-ed Details  NuStep x10 minutes total at resistance level 1 using LUE and BLE's for BLE NMR, to promote active pt performance of functional movement pattern (reciprocal LE flexion/extension) involved in w/c self-propulsion. Pt did require encouragement to continue to participate during final 3 minutes to continue.                  PT Short Term Goals - 08/16/15 1410      PT SHORT TERM GOAL #1   Title Patient will participate in > 75% of initial 2 PT sessions to indicate patient is an appropriate candidate for outpatient PT.  (Target date: 08/13/15)   Baseline Met 08/16/15.   Status Achieved     PT SHORT TERM GOAL #2   Title Patient will tolerate standing with total A of standing frame for >/= 5 miinutes to indicate increased standing tolerance. (08/13/15)   Baseline 8/1: tolerated standing with total A of standing frame x9.5 minutes   Status Achieved     PT SHORT TERM GOAL #3   Title Pt will perform NuStep for >/= 5 minutes (lower  extremities only) to indicate increased activity tolerance.  (08/13/15)   Baseline 8/11: Pt performed NuStep x10 minutes (with LUE and BLE's x9 minutes, with LE's only x1 minute)   Status Partially Met           PT Long Term Goals - 07/23/15 2006      PT LONG TERM GOAL #1   Title Patient will tolerate standing with total A of standing frame for >/= 15 miinutes to indicate significant improvment in standing tolerance.  (Target date: 09/02/15)     PT LONG TERM GOAL #2   Title Pt will perform NuStep for >/= 10 minutes (lower extremities only) to indicate increased activity tolerance.  (09/02/15)     PT LONG TERM GOAL #3   Title Pt will participate in standing with use of body weight support system to achieve family's goal of increasing supported standing tolerance.  (09/02/15)                Plan - 08/16/15 1420    Clinical Impression Statement Session focused on trial of NuStep for LE NMR, for active pt engagement in functional LE movement pattern. Pt performed NuStep x10 minutes with LUE and BLE's; required encouragement for final 3 minutes due to fatigue. No refusal or tearfulness noted thorughout this session. Pt met or partially met 3 of 3 STG's.   Clinical Impairments Affecting Rehab Potential chronicity of R hemiplegia; R inattention; global aphasia; and chronic knee pain   PT Frequency Other (comment)  8 total sessions   PT Duration 8 weeks   PT Treatment/Interventions ADLs/Self Care Home Management;Stair training;Neuromuscular re-education;Functional mobility training;Patient/family education;Therapeutic activities;Balance training;Orthotic Fit/Training;Therapeutic exercise   PT Next Visit Plan Attempt use of BWSS for standing   Consulted and Agree with Plan of Care Patient;Family member/caregiver   Family Member Consulted daughter, [name]      Patient will benefit from skilled therapeutic intervention in order to improve the following deficits and impairments:  Decreased cognition, Pain, Other (comment), Decreased balance, Decreased activity tolerance, Decreased endurance, Decreased coordination, Decreased mobility, Impaired sensation, Impaired tone, Impaired UE functional use, Decreased strength, Postural dysfunction (Pain will be monitored but not directly addressed by PT due to nature of referral)  Visit Diagnosis: Hemiplegia and hemiparesis following cerebral infarction affecting right dominant side (HCC)  Other abnormalities of gait and mobility     Problem List Patient Active Problem List   Diagnosis Date Noted  . Essential thrombocytosis ([place]) 07/31/2015  . SVC (superior vena cava obstruction), chronic 07/20/2015  . Dysphagia 07/20/2015  . Elevated liver enzymes 07/20/2015  . Constipation 07/20/2015  . Thrombophlebitis of breast,  right 06/18/2015  . Tracheal deviation 06/06/2015  . Sensorineural hearing loss, bilateral, moderate-moderately severe 04/30/2015  . Abdominal wall lump 03/09/2015  . Frequent UTI 02/06/2015  . Primary osteoarthritis involving multiple joints 07/26/2014  . Pernicious anemia 07/26/2014  . Hearing loss 07/26/2014  . Encounter for therapeutic drug monitoring 07/17/2014  . Spastic hemiparesis affecting dominant side ([place]), right 05/31/2014  . DVT of lower extremity, bilateral ([place]) 03/14/2014  . Global aphasia 03/14/2014  . 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 Aphasia due to stroke 03/13/2014  . Cerebral infarction due to embolism of left middle cerebral artery ([place])   . Stroke, embolic ([place]) 26/94/8546  . History of pulmonary embolism 03/08/2014  . Back pain 08/29/2013  . Primary localized osteoarthrosis, lower leg 03/06/2013  . IBS (irritable bowel syndrome)   . Multinodular goiter 01/13/2011  . Hyperthyroidism 11/11/2009  . Insomnia  07/03/2009  . POSTTRAUMATIC STRESS DISORDER 08/22/2008  . OBESITY 04/21/2008  . Osteoarthritis 04/21/2008  . MIGRAINE HEADACHE 04/20/2008  . Essential hypertension 04/20/2008    Billie Ruddy, PT, DPT [facility] [address] [place], [place], [address] Phone: [phone]   Fax:  [phone] 08/16/15, 2:24 PM  Name: Sandy Barrett MRN: [id number] Date of Birth: 08-24-1932

## 2015-08-18 ENCOUNTER — Other Ambulatory Visit: Payer: Self-pay | Admitting: Internal Medicine

## 2015-08-19 ENCOUNTER — Ambulatory Visit: Payer: Medicare Other | Admitting: Nurse Practitioner

## 2015-08-20 ENCOUNTER — Ambulatory Visit: Payer: Medicare Other | Admitting: Physical Therapy

## 2015-08-21 ENCOUNTER — Other Ambulatory Visit: Payer: Self-pay | Admitting: General Practice

## 2015-08-21 ENCOUNTER — Other Ambulatory Visit: Payer: Self-pay | Admitting: Internal Medicine

## 2015-08-21 MED ORDER — WARFARIN SODIUM 4 MG PO TABS: ORAL_TABLET | ORAL | 3 refills | Status: DC

## 2015-08-22 ENCOUNTER — Inpatient Hospital Stay (HOSPITAL_COMMUNITY)
Admission: AD | Admit: 2015-08-22 | Discharge: 2015-08-22 | Disposition: A | Discharge status: Home / Self Care | Payer: Medicare Other | Source: Ambulatory Visit | Attending: Obstetrics and Gynecology | Admitting: Obstetrics and Gynecology

## 2015-08-22 ENCOUNTER — Ambulatory Visit: Payer: Medicare Other | Admitting: Physical Therapy

## 2015-08-22 DIAGNOSIS — E0590 Thyrotoxicosis, unspecified without thyrotoxic crisis or storm: Secondary | ICD-10-CM | POA: Diagnosis not present

## 2015-08-22 DIAGNOSIS — I10 Essential (primary) hypertension: Secondary | ICD-10-CM | POA: Diagnosis not present

## 2015-08-22 DIAGNOSIS — Z8673 Personal history of transient ischemic attack (TIA), and cerebral infarction without residual deficits: Secondary | ICD-10-CM | POA: Insufficient documentation

## 2015-08-22 DIAGNOSIS — Z7901 Long term (current) use of anticoagulants: Secondary | ICD-10-CM | POA: Insufficient documentation

## 2015-08-22 DIAGNOSIS — N3001 Acute cystitis with hematuria: Secondary | ICD-10-CM | POA: Diagnosis not present

## 2015-08-22 DIAGNOSIS — F4310 Post-traumatic stress disorder, unspecified: Secondary | ICD-10-CM | POA: Insufficient documentation

## 2015-08-22 DIAGNOSIS — Z86711 Personal history of pulmonary embolism: Secondary | ICD-10-CM | POA: Insufficient documentation

## 2015-08-22 DIAGNOSIS — Z993 Dependence on wheelchair: Secondary | ICD-10-CM | POA: Insufficient documentation

## 2015-08-22 DIAGNOSIS — E669 Obesity, unspecified: Secondary | ICD-10-CM | POA: Diagnosis not present

## 2015-08-22 DIAGNOSIS — L298 Other pruritus: Secondary | ICD-10-CM | POA: Diagnosis present

## 2015-08-22 LAB — URINE MICROSCOPIC-ADD ON

## 2015-08-22 LAB — WET PREP, GENITAL
Clue Cells Wet Prep HPF POC: NONE SEEN
Sperm: NONE SEEN
Trich, Wet Prep: NONE SEEN
Yeast Wet Prep HPF POC: NONE SEEN

## 2015-08-22 LAB — URINALYSIS, ROUTINE W REFLEX MICROSCOPIC
Glucose, UA: NEGATIVE mg/dL
Ketones, ur: NEGATIVE mg/dL
Nitrite: NEGATIVE
Protein, ur: 30 mg/dL — AB
Specific Gravity, Urine: >1.03 — ABNORMAL HIGH (ref 1.005–1.030)
pH: 6 (ref 5.0–8.0)

## 2015-08-22 MED ORDER — NITROFURANTOIN MONOHYD MACRO 100 MG PO CAPS: 100.0000 mg | ORAL_CAPSULE | Freq: Two times a day (BID) | ORAL | 0 refills | Status: DC

## 2015-08-22 MED ORDER — SULFAMETHOXAZOLE-TRIMETHOPRIM 800-160 MG PO TABS: 1.0000 | ORAL_TABLET | Freq: Two times a day (BID) | ORAL | 0 refills | Status: DC

## 2015-08-22 NOTE — Discharge Instructions (Signed)

## 2015-08-22 NOTE — MAU Provider Note (Signed)
History     CSN: [id number]  Arrival date and time: 08/22/15 1345   None     Chief Complaint  Patient presents with  . Vaginal Itching   Postmenopausal female s/p CVA and difficult to understand therefore HPI obtained from daughter. She reports odorous and orange colored urine x2 weeks. She denies fever, new onset back pain, dysuria, urgency, polyuria, and hematuria. She denies a decrease in hydration but admits hydration has been a challenge since the CVA. She also reports seeing pt scoot her bottom in the wheelchair at times, indicating some itching. The patient is WC bound and incontinent of urine and stool. She has been treated with Diflucan x2 and Terazol for suspected yeast vaginitis without any response. She also endorses pain just above the bladder.    Past Medical History:  Diagnosis Date  . ARTHRITIS   . Arthritis   . Diverticulosis   . HYPERTENSION   . HYPERTHYROIDISM   . Hyperthyroidism    s/p I-131 ablation 03/2011 of multinod goiter  . INSOMNIA   . MIGRAINE HEADACHE   . OBESITY   . Posttraumatic stress disorder   . Pulmonary embolism ([place]) 03/2014   with DVT  . Stroke [facility]) 03/2014   dysarthria    Past Surgical History:  Procedure Laterality Date  . ABDOMINAL HYSTERECTOMY  1976  . BREAST SURGERY     biopsy  . RADIOLOGY WITH ANESTHESIA Left 03/08/2014   Procedure: RADIOLOGY WITH ANESTHESIA;  Surgeon: Rob Hickman, MD;  Location: [facility];  Service: Radiology;  Laterality: Left;  . TONSILLECTOMY    . TOTAL HIP ARTHROPLASTY  1998    right    Family History  Problem Relation Age of Onset  . Asthma Mother   . Asthma Father   . Prostate cancer Father   . Stroke Brother   . Breast cancer Sister   . Stomach cancer Sister   . Thyroid disease Neg Hx     Social History  Substance Use Topics  . Smoking status: Never Smoker  . Smokeless tobacco: Never Used  . Alcohol use No    Allergies: No Known Allergies  Facility-Administered Medications Prior to  Admission  Medication Dose Route Frequency Provider Last Rate Last Dose  . albuterol (PROVENTIL) (2.5 MG/3ML) 0.083% nebulizer solution 2.5 mg  2.5 mg Nebulization Once Burnard Hawthorne, FNP       Prescriptions Prior to Admission  Medication Sig Dispense Refill Last Dose  . acetaminophen (TYLENOL) 325 MG tablet Take 1-2 tablets (325-650 mg total) by mouth every 4 (four) hours as needed for mild pain.   Past Month at Unknown time  . diclofenac sodium (VOLTAREN) 1 % GEL APPLY (2GMS) TOPICALLY THREE TIMES DAILY. (Patient taking differently: APPLY (2GMS) TOPICALLY THREE TIMES DAILY AS NEEDED FOR PAIN) 300 g 3 08/22/2015 at Unknown time  . gabapentin (NEURONTIN) 100 MG capsule Take 1 capsule (100 mg total) by mouth 3 (three) times daily. 90 capsule 5 08/22/2015 at Unknown time  . hydroxyurea (HYDREA) 500 MG capsule Take 1 capsule (500 mg total) by mouth daily. May take with food to minimize GI side effects. (Patient taking differently: Take 1,000 mg by mouth daily. May take with food to minimize GI side effects.) 30 capsule 0 08/21/2015 at Unknown time  . pantoprazole sodium (PROTONIX) 40 mg/20 mL PACK Take 20 mLs (40 mg total) by mouth daily. 30 each 5 08/22/2015 at Unknown time  . polyethylene glycol (MIRALAX) packet Take 17 g by mouth 2 (two)  times daily. 14 each 0 Past Week at Unknown time  . Probiotic Product (ALIGN) 4 MG CAPS Take 1 capsule by mouth daily.    08/22/2015 at Unknown time  . propranolol (INDERAL) 10 MG tablet Take 1 tablet (10 mg total) by mouth 2 (two) times daily. 60 tablet 3 08/22/2015 at Unknown time  . terconazole (TERAZOL 7) 0.4 % vaginal cream Place 1 applicator vaginally at bedtime. 90 g 3 08/22/2015 at Unknown time  . traMADol (ULTRAM) 50 MG tablet TAKE 1 TO 2 TABLETS BY MOUTH 3 TIMES DAILY AS NEEDED (Patient taking differently: TAKE 1 TO 2 TABLETS BY MOUTH 3 TIMES DAILY AS NEEDED FOR PAIN) 180 tablet 1 08/22/2015 at Unknown time  . traZODone (DESYREL) 50 MG tablet Take 1 tablet (50  mg total) by mouth at bedtime as needed. for sleep 30 tablet 5 08/21/2015 at Unknown time  . warfarin (COUMADIN) 4 MG tablet Take as directed by anticoagulation clinic. (Patient taking differently: Take as directed by anticoagulation clinic. Patient is taking 5 mg daily as 08/22/15. Verify dosage with patient upon admitting, changes weekly based on INR.) 40 tablet 3 08/22/2015 at Unknown time  . albuterol (PROVENTIL HFA) 108 (90 Base) MCG/ACT inhaler Inhale 2 puffs into the lungs every 6 (six) hours as needed for wheezing or shortness of breath. 1 Inhaler 1 Rescue  . fluconazole (DIFLUCAN) 150 MG tablet Take 1 tablet (150 mg total) by mouth daily. (Patient not taking: Reported on 08/22/2015) 2 tablet 2 Completed Course at Unknown time  . methimazole (TAPAZOLE) 5 MG tablet Take 1 tablet (5 mg total) by mouth 2 (two) times a week. 12 tablet 2 08/20/15    Review of Systems  Constitutional: Negative.   Gastrointestinal: Positive for abdominal pain.  Genitourinary: Negative.    Physical Exam   Blood pressure 134/83, pulse 82, temperature 98.1 F (36.7 C), temperature source Oral, resp. rate 17.  Physical Exam  Constitutional: She is oriented to person, place, and time. She appears well-developed.  HENT:  Head: Normocephalic and atraumatic.  Neck: Normal range of motion.  Cardiovascular: Normal rate.   Respiratory: Effort normal.  GI: Soft. She exhibits no distension. There is no tenderness.  Genitourinary:  Genitourinary Comments: External: no lesions, intact, no erythema, mucous membranes moist Internal exam deferred d/t mobility  Neurological: She is alert and oriented to person, place, and time.  Skin: Skin is warm and dry.  Psychiatric: She has a normal mood and affect.   Results for orders placed or performed during the hospital encounter of 08/22/15 (from the past 24 hour(s))  Urinalysis, Routine w reflex microscopic (not at [facility])     Status: Abnormal   Collection Time: 08/22/15  4:40  PM  Result Value Ref Range   Color, Urine AMBER (A) YELLOW   APPearance CLEAR CLEAR   Specific Gravity, Urine >1.030 (H) 1.005 - 1.030   pH 6.0 5.0 - 8.0   Glucose, UA NEGATIVE NEGATIVE mg/dL   Hgb urine dipstick MODERATE (A) NEGATIVE   Bilirubin Urine SMALL (A) NEGATIVE   Ketones, ur NEGATIVE NEGATIVE mg/dL   Protein, ur 30 (A) NEGATIVE mg/dL   Nitrite NEGATIVE NEGATIVE   Leukocytes, UA SMALL (A) NEGATIVE  Wet prep, genital     Status: Abnormal   Collection Time: 08/22/15  4:40 PM  Result Value Ref Range   Yeast Wet Prep HPF POC NONE SEEN NONE SEEN   Trich, Wet Prep NONE SEEN NONE SEEN   Clue Cells Wet Prep HPF POC  NONE SEEN NONE SEEN   WBC, Wet Prep HPF POC MODERATE (A) NONE SEEN   Sperm NONE SEEN   Urine microscopic-add on     Status: Abnormal   Collection Time: 08/22/15  4:40 PM  Result Value Ref Range   Squamous Epithelial / LPF 6-30 (A) NONE SEEN   WBC, UA 6-30 0 - 5 WBC/hpf   RBC / HPF 0-5 0 - 5 RBC/hpf   Bacteria, UA MANY (A) NONE SEEN    MAU Course  Procedures  MDM Labs ordered and reviewed. No evidence of YV, itching may be d/t adult briefs and ointments. Recommend avoiding ointment near vagina. Will treat UTI. Stable for discharge home.  Assessment and Plan   1. Acute cystitis with hematuria    Discharge home Bactrim DS 1 tab po bid x7 days Encourage hydration Follow up at [facility] prn  Julianne Handler, CNM 08/22/2015, 5:30 PM

## 2015-08-22 NOTE — MAU Note (Signed)
Patient presents with c/o vaginal itching for the past 2 weeks . Patient was prescribed diflucan but has had no relief for a week.

## 2015-08-22 NOTE — MAU Note (Signed)
Pt in lobby with her daughter, appears stable, informed her we will bring her back when room is available.

## 2015-08-23 ENCOUNTER — Ambulatory Visit (INDEPENDENT_AMBULATORY_CARE_PROVIDER_SITE_OTHER): Payer: Medicare Other | Admitting: General Practice

## 2015-08-23 ENCOUNTER — Ambulatory Visit (HOSPITAL_BASED_OUTPATIENT_CLINIC_OR_DEPARTMENT_OTHER): Payer: Medicare Other | Admitting: Hematology and Oncology

## 2015-08-23 ENCOUNTER — Other Ambulatory Visit (HOSPITAL_BASED_OUTPATIENT_CLINIC_OR_DEPARTMENT_OTHER): Payer: Medicare Other

## 2015-08-23 ENCOUNTER — Encounter: Payer: Self-pay | Admitting: Hematology and Oncology

## 2015-08-23 ENCOUNTER — Telehealth: Payer: Self-pay | Admitting: Hematology and Oncology

## 2015-08-23 ENCOUNTER — Ambulatory Visit: Payer: Medicare Other | Admitting: Physical Therapy

## 2015-08-23 VITALS — BP 123/64 | HR 79 | Temp 98.1°F | Resp 16 | Ht 60.0 in

## 2015-08-23 VITALS — BP 135/84 | HR 86

## 2015-08-23 DIAGNOSIS — I69351 Hemiplegia and hemiparesis following cerebral infarction affecting right dominant side: Secondary | ICD-10-CM | POA: Diagnosis not present

## 2015-08-23 DIAGNOSIS — G8110 Spastic hemiplegia affecting unspecified side: Secondary | ICD-10-CM

## 2015-08-23 DIAGNOSIS — G8111 Spastic hemiplegia affecting right dominant side: Secondary | ICD-10-CM

## 2015-08-23 DIAGNOSIS — R2681 Unsteadiness on feet: Secondary | ICD-10-CM

## 2015-08-23 DIAGNOSIS — Z5181 Encounter for therapeutic drug level monitoring: Secondary | ICD-10-CM

## 2015-08-23 DIAGNOSIS — Z86718 Personal history of other venous thrombosis and embolism: Secondary | ICD-10-CM

## 2015-08-23 DIAGNOSIS — R2689 Other abnormalities of gait and mobility: Secondary | ICD-10-CM

## 2015-08-23 DIAGNOSIS — I639 Cerebral infarction, unspecified: Secondary | ICD-10-CM

## 2015-08-23 DIAGNOSIS — D473 Essential (hemorrhagic) thrombocythemia: Secondary | ICD-10-CM | POA: Diagnosis not present

## 2015-08-23 DIAGNOSIS — Z86711 Personal history of pulmonary embolism: Secondary | ICD-10-CM

## 2015-08-23 LAB — POCT INR: INR: 1.6

## 2015-08-23 LAB — CBC WITH DIFFERENTIAL/PLATELET
BASO%: 0.5 % (ref 0.0–2.0)
Basophils Absolute: 0 10*3/uL (ref 0.0–0.1)
EOS%: 2.2 % (ref 0.0–7.0)
Eosinophils Absolute: 0.1 10*3/uL (ref 0.0–0.5)
HCT: 40.9 % (ref 34.8–46.6)
HGB: 13.5 g/dL (ref 11.6–15.9)
LYMPH%: 12.9 % — ABNORMAL LOW (ref 14.0–49.7)
MCH: 31 pg (ref 25.1–34.0)
MCHC: 33 g/dL (ref 31.5–36.0)
MCV: 94 fL (ref 79.5–101.0)
MONO#: 0.5 10*3/uL (ref 0.1–0.9)
MONO%: 7.1 % (ref 0.0–14.0)
NEUT#: 5 10*3/uL (ref 1.5–6.5)
NEUT%: 77.3 % — ABNORMAL HIGH (ref 38.4–76.8)
Platelets: 765 10*3/uL — ABNORMAL HIGH (ref 145–400)
RBC: 4.35 10*6/uL (ref 3.70–5.45)
RDW: 18.5 % — ABNORMAL HIGH (ref 11.2–14.5)
WBC: 6.5 10*3/uL (ref 3.9–10.3)
lymph#: 0.8 10*3/uL — ABNORMAL LOW (ref 0.9–3.3)
nRBC: 0 % (ref 0–0)

## 2015-08-23 MED ORDER — HYDROXYUREA 500 MG PO CAPS: 1000.0000 mg | ORAL_CAPSULE | Freq: Every day | ORAL | 9 refills | Status: DC

## 2015-08-23 NOTE — Assessment & Plan Note (Signed)
I had a long discussion with the patient and her daughter. Peripheral blood for JAK2 mutation came back positive.  I suspect the patient may have essential thrombocytosis. She was started on hydroxyurea with mild improvement of her blood count. I recommend she continues hydroxyurea 1000 mg daily and reassess in 2 weeks We discussed prognosis with MPN and we discussed side effects of hydroxyurea in length She will continue on chronic anticoagulation therapy

## 2015-08-23 NOTE — Therapy (Signed)
[facility] [address] [place], [place], [address] Phone: [phone]   Fax:  [phone]  Physical Therapy Treatment  Patient Details  Name: Sandy Barrett MRN: [id number] Date of Birth: Sep 08, 1932 Referring Provider: [name], MD  Encounter Date: 08/23/2015      PT End of Session - 08/23/15 1755    Visit Number 4   Number of Visits 9   Date for PT Re-Evaluation 09/06/15   Authorization Type Medicare Traditional primary; BCBS secondary   Authorization Time Period G Codes required   PT Start Time 1320   PT Stop Time 1401   PT Time Calculation (min) 41 min   Equipment Utilized During Treatment Other (comment)  standing frame   Activity Tolerance Patient tolerated treatment well   Behavior During Therapy WFL for tasks assessed/performed      Past Medical History:  Diagnosis Date  . ARTHRITIS   . Arthritis   . Diverticulosis   . HYPERTENSION   . HYPERTHYROIDISM   . Hyperthyroidism    s/p I-131 ablation 03/2011 of multinod goiter  . INSOMNIA   . MIGRAINE HEADACHE   . OBESITY   . Posttraumatic stress disorder   . Pulmonary embolism ([place]) 03/2014   with DVT  . Stroke [facility]) 03/2014   dysarthria    Past Surgical History:  Procedure Laterality Date  . ABDOMINAL HYSTERECTOMY  1976  . BREAST SURGERY     biopsy  . RADIOLOGY WITH ANESTHESIA Left 03/08/2014   Procedure: RADIOLOGY WITH ANESTHESIA;  Surgeon: Rob Hickman, MD;  Location: [facility];  Service: Radiology;  Laterality: Left;  . TONSILLECTOMY    . TOTAL HIP ARTHROPLASTY  1998    right    Vitals:   08/23/15 1344  BP: 135/84  Pulse: 86        Subjective Assessment - 08/23/15 1750    Subjective Per daughter, "I don't thing we need to use the body weight support system...she does just fine with that machine over there (standing frame)."   Patient is accompained by: Family member  daughter, [name]   Pertinent History PMH significant  for: L MCA CVA (03/2014) with severe R spastic hemiplegia, global aphasia, and apraxia; HTN, HLD hyperthyroidism, OA, PE with DVT (03/2014), R THA.   Patient Stated Goals Per daughter, "Strengthen her legs in a more active way - maybe use the machines (NuStep) and try some standing. I'm not interrested in working on transfers, because I can't physically help her with that at home."   Currently in Pain? No/denies                         [facility] Adult PT Treatment/Exercise - 08/23/15 0001      Transfers   Transfers Lateral/Scoot Transfers   Sit to Stand 1: +1 Total assist;From chair/3-in-1;Other (comment)  with Total A of standing frame   Sit to Stand Details Tactile cues for posture;Tactile cues for weight shifting;Verbal cues for precautions/safety  manual stabilization/protection of R shoulder girdle   Lateral/Scoot Transfers 1: +2 Total assist   Lateral Transfers: Patient Percentage 10%   Lateral/Scoot Transfer Details (indicate cue type and reason) from w/c <> mat table; level transfer     Ambulation/Gait   Ambulation/Gait No  Pt non-ambulatory     Neuro Re-ed    Neuro Re-ed Details  Static/dynamic standing with total A of standing frame x14 consecutive minutes (to pt tolerance) with vital signs WNL after standing  x5 minutes. After standing x7 minutes, attempted to engage pt in game of balloon tap using LUE (to promote postural stability/control); however, pt visibly apprehensive to remove L forearm from standing frame table, electing to catch balloon between L hand and forehead to avoid lifting L forearm.                PT Education - 08/23/15 1751    Education provided Yes   Education Details Discussed trialing power w/c to determine if this would be an appropriate option for patient.    Person(s) Educated Patient;Child(ren)   Methods Explanation   Comprehension Verbalized understanding          PT Short Term Goals - 08/16/15 1410      PT SHORT TERM  GOAL #1   Title Patient will participate in > 75% of initial 2 PT sessions to indicate patient is an appropriate candidate for outpatient PT.  (Target date: 08/13/15)   Baseline Met 08/16/15.   Status Achieved     PT SHORT TERM GOAL #2   Title Patient will tolerate standing with total A of standing frame for >/= 5 miinutes to indicate increased standing tolerance. (08/13/15)   Baseline 8/1: tolerated standing with total A of standing frame x9.5 minutes   Status Achieved     PT SHORT TERM GOAL #3   Title Pt will perform NuStep for >/= 5 minutes (lower extremities only) to indicate increased activity tolerance.  (08/13/15)   Baseline 8/11: Pt performed NuStep x10 minutes (with LUE and BLE's x9 minutes, with LE's only x1 minute)   Status Partially Met           PT Long Term Goals - 07/23/15 2006      PT LONG TERM GOAL #1   Title Patient will tolerate standing with total A of standing frame for >/= 15 miinutes to indicate significant improvment in standing tolerance.  (Target date: 09/02/15)     PT LONG TERM GOAL #2   Title Pt will perform NuStep for >/= 10 minutes (lower extremities only) to indicate increased activity tolerance.  (09/02/15)     PT LONG TERM GOAL #3   Title Pt will participate in standing with use of body weight support system to achieve family's goal of increasing supported standing tolerance.  (09/02/15)               Plan - 08/23/15 1759    Clinical Impression Statement Session focused on use of standing frame for spasticity management, midline orientation, and LE muscle activation via WB. Attempted to engage pt in game of balloon tap with LUE for increased postural control; however, pt apprehensive to remove L hand from standing frame table. Pt did tolerate 14 minutes in standing frame prior to overt signs of discomfort (grimacing).  Daughter now reports no desire to trial BWS system for standing.    Clinical Impairments Affecting Rehab Potential chronicity of R  hemiplegia; R inattention; global aphasia; and chronic knee pain   PT Frequency Other (comment)  8 total sessions   PT Duration 8 weeks   PT Treatment/Interventions ADLs/Self Care Home Management;Neuromuscular re-education;Functional mobility training;Patient/family education;Therapeutic activities;Balance training;Orthotic Fit/Training;Therapeutic exercise   PT Next Visit Plan Revisit NuStep    Consulted and Agree with Plan of Care Patient;Family member/caregiver   Family Member Consulted daughter, [name]      Patient will benefit from skilled therapeutic intervention in order to improve the following deficits and impairments:  Decreased cognition, Pain, Other (comment), Decreased balance,  Decreased activity tolerance, Decreased endurance, Decreased coordination, Decreased mobility, Impaired sensation, Impaired tone, Impaired UE functional use, Decreased strength, Postural dysfunction (Pain will be monitored but not directly addressed by PT due to nature of referral)  Visit Diagnosis: Hemiplegia and hemiparesis following cerebral infarction affecting right dominant side (HCC)  Other abnormalities of gait and mobility  Unsteadiness on feet     Problem List Patient Active Problem List   Diagnosis Date Noted  . Essential thrombocytosis ([place]) 07/31/2015  . SVC (superior vena cava obstruction), chronic 07/20/2015  . Dysphagia 07/20/2015  . Elevated liver enzymes 07/20/2015  . Constipation 07/20/2015  . Thrombophlebitis of breast, right 06/18/2015  . Tracheal deviation 06/06/2015  . Sensorineural hearing loss, bilateral, moderate-moderately severe 04/30/2015  . Abdominal wall lump 03/09/2015  . Frequent UTI 02/06/2015  . Primary osteoarthritis involving multiple joints 07/26/2014  . Pernicious anemia 07/26/2014  . Hearing loss 07/26/2014  . Encounter for therapeutic drug monitoring 07/17/2014  . Spastic hemiparesis affecting dominant side ([place]), right 05/31/2014  . DVT of lower  extremity, bilateral ([place]) 03/14/2014  . Global aphasia 03/14/2014  . 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 Aphasia due to stroke 03/13/2014  . Cerebral infarction due to embolism of left middle cerebral artery ([place])   . Stroke, embolic ([place]) 93/81/0175  . History of pulmonary embolism 03/08/2014  . Back pain 08/29/2013  . Primary localized osteoarthrosis, lower leg 03/06/2013  . IBS (irritable bowel syndrome)   . Multinodular goiter 01/13/2011  . Hyperthyroidism 11/11/2009  . Insomnia 07/03/2009  . POSTTRAUMATIC STRESS DISORDER 08/22/2008  . OBESITY 04/21/2008  . Osteoarthritis 04/21/2008  . MIGRAINE HEADACHE 04/20/2008  . Essential hypertension 04/20/2008    Billie Ruddy, PT, DPT [facility] [address] [place], [place], [address] Phone: [phone]   Fax:  [phone] 08/23/15, 6:03 PM  Name: Vanity Larsson MRN: [id number] Date of Birth: 10/17/1932

## 2015-08-23 NOTE — Telephone Encounter (Signed)
Gave pt cal & avs, watiing on override for MD apt

## 2015-08-23 NOTE — Assessment & Plan Note (Signed)
The patient had history of extensive PE and DVT, likely due to undiagnosed essential thrombocytosis She will remain on chronic anticoagulation therapy indefinitely

## 2015-08-23 NOTE — Progress Notes (Signed)
I have reviewed and agree with the plan. 

## 2015-08-23 NOTE — Assessment & Plan Note (Signed)
She had history of recurrent stroke in the past. She is currently on medical management and chronic anticoagulation therapy. The patient is debilitated and is dependent on her daughter for all activities of daily living

## 2015-08-23 NOTE — Progress Notes (Signed)
[place] OFFICE PROGRESS NOTE  Patient Care Team: [name], MD as PCP - General (Internal Medicine) Eunice Blase, MD (Inactive) as Consulting Physician (Family Medicine) Renato Shin, MD as Consulting Physician (Endocrinology) Netta Cedars, MD as Consulting Physician (Orthopedic Surgery) Lyndal Pulley, DO (Sports Medicine) Garvin Fila, MD (Neurology)  SUMMARY OF ONCOLOGIC HISTORY:   Essential thrombocytosis ([place])   08/07/2013 Miscellaneous    The patient is noted to have elevated platelet count      03/08/2014 Imaging    Positive for acute PE with CT evidence of right heart strain (RV/LV Ratio = 0.9) consistent with at least submassive (intermediate risk)PE.       03/09/2014 Imaging    US venous Doppler showed deep vein thrombosis noted in the right distal common femoral vein, femoral vein, and popliteal vein. DVT noted in the left femoral and popliteal veins      03/09/2014 Imaging    Patchy areas of acute left MCA territory infarction. 2. Several punctate foci of acute right MCA territory infarction. Possible trace subarachnoid hemorrhage in the high right frontal lobe. 3. Occluded left ICA and left MCA      03/20/2015 Imaging    Compared to MRI on 03/09/14, there has been expected evolutional change of left MCA infarction. In addition, there may be a few areas of acute-subacute infarcts in the left basal ganglia vs artifact.        07/31/2015 Pathology Results    Peripheral blood is positive for JAK2 mutation      07/31/2015 -  Chemotherapy    She is started on 500 mg daily Hydrea       INTERVAL HISTORY: Please see below for problem oriented charting. She returns with her daughter. According to the daughter, she tolerated treatment well without any appreciable side effects. The patient denies any recent signs or symptoms of bleeding such as spontaneous epistaxis, hematuria or hematochezia.   REVIEW OF SYSTEMS:   Constitutional: Denies fevers,  chills or abnormal weight loss Eyes: Denies blurriness of vision Ears, nose, mouth, throat, and face: Denies mucositis or sore throat Respiratory: Denies cough, dyspnea or wheezes Cardiovascular: Denies palpitation, chest discomfort or lower extremity swelling Gastrointestinal:  Denies nausea, heartburn or change in bowel habits Skin: Denies abnormal skin rashes Lymphatics: Denies new lymphadenopathy or easy bruising Neurological:Denies numbness, tingling or new weaknesses Behavioral/Psych: Mood is stable, no new changes  All other systems were reviewed with the patient and are negative.  I have reviewed the past medical history, past surgical history, social history and family history with the patient and they are unchanged from previous note.  ALLERGIES:  has No Known Allergies.  MEDICATIONS:  Current Outpatient Prescriptions  Medication Sig Dispense Refill  . acetaminophen (TYLENOL) 325 MG tablet Take 1-2 tablets (325-650 mg total) by mouth every 4 (four) hours as needed for mild pain.    [name] albuterol (PROVENTIL HFA) 108 (90 Base) MCG/ACT inhaler Inhale 2 puffs into the lungs every 6 (six) hours as needed for wheezing or shortness of breath. 1 Inhaler 1  . diclofenac sodium (VOLTAREN) 1 % GEL APPLY (2GMS) TOPICALLY THREE TIMES DAILY. (Patient taking differently: APPLY (2GMS) TOPICALLY THREE TIMES DAILY AS NEEDED FOR PAIN) 300 g 3  . gabapentin (NEURONTIN) 100 MG capsule Take 1 capsule (100 mg total) by mouth 3 (three) times daily. 90 capsule 5  . hydroxyurea (HYDREA) 500 MG capsule Take 2 capsules (1,000 mg total) by mouth daily. May take with food to  minimize GI side effects. 60 capsule 9  . methimazole (TAPAZOLE) 5 MG tablet Take 1 tablet (5 mg total) by mouth 2 (two) times a week. 12 tablet 2  . pantoprazole sodium (PROTONIX) 40 mg/20 mL PACK Take 20 mLs (40 mg total) by mouth daily. 30 each 5  . polyethylene glycol (MIRALAX) packet Take 17 g by mouth 2 (two) times daily. 14 each 0  .  Probiotic Product (ALIGN) 4 MG CAPS Take 1 capsule by mouth daily.     . propranolol (INDERAL) 10 MG tablet Take 1 tablet (10 mg total) by mouth 2 (two) times daily. 60 tablet 3  . sulfamethoxazole-trimethoprim (BACTRIM DS,SEPTRA DS) 800-160 MG tablet Take 1 tablet by mouth 2 (two) times daily. 14 tablet 0  . terconazole (TERAZOL 7) 0.4 % vaginal cream Place 1 applicator vaginally at bedtime. 90 g 3  . traMADol (ULTRAM) 50 MG tablet TAKE 1 TO 2 TABLETS BY MOUTH 3 TIMES DAILY AS NEEDED (Patient taking differently: TAKE 1 TO 2 TABLETS BY MOUTH 3 TIMES DAILY AS NEEDED FOR PAIN) 180 tablet 1  . traZODone (DESYREL) 50 MG tablet Take 1 tablet (50 mg total) by mouth at bedtime as needed. for sleep 30 tablet 5  . warfarin (COUMADIN) 4 MG tablet Take as directed by anticoagulation clinic. (Patient taking differently: Take as directed by anticoagulation clinic. Patient is taking 5 mg daily as 08/22/15. Verify dosage with patient upon admitting, changes weekly based on INR.) 40 tablet 3   Current Facility-Administered Medications  Medication Dose Route Frequency Provider Last Rate Last Dose  . albuterol (PROVENTIL) (2.5 MG/3ML) 0.083% nebulizer solution 2.5 mg  2.5 mg Nebulization Once Burnard Hawthorne, FNP        PHYSICAL EXAMINATION: ECOG PERFORMANCE STATUS: 2 - Symptomatic, <50% confined to bed  Vitals:   08/23/15 1532  BP: 123/64  Pulse: 79  Resp: 16  Temp: 98.1 F (36.7 C)   Filed Weights    GENERAL:alert, no distress and comfortable SKIN: skin color, texture, turgor are normal, no rashes or significant lesions EYES: normal, Conjunctiva are pink and non-injected, sclera clear Musculoskeletal:no cyanosis of digits and no clubbing  NEURO: alert With dysarthria and persistent left-sided deficit  LABORATORY DATA:  I have reviewed the data as listed    Component Value Date/Time   NA 139 07/19/2015 1620   K 3.7 07/19/2015 1620   CL 104 07/19/2015 1620   CO2 28 07/19/2015 1620   GLUCOSE  78 07/19/2015 1620   BUN 11 07/19/2015 1620   CREATININE 0.39 (L) 07/19/2015 1620   CALCIUM 9.5 07/19/2015 1620   PROT 6.8 07/19/2015 1620   ALBUMIN 3.6 07/19/2015 1620   AST 16 07/19/2015 1620   ALT 49 (H) 07/19/2015 1620   ALKPHOS 125 (H) 07/19/2015 1620   BILITOT 1.5 (H) 07/19/2015 1620   GFRNONAA >60 06/27/2015 0454   GFRAA >60 06/27/2015 0454    No results found for: SPEP, UPEP  Lab Results  Component Value Date   WBC 6.5 08/23/2015   NEUTROABS 5.0 08/23/2015   HGB 13.5 08/23/2015   HCT 40.9 08/23/2015   MCV 94.0 08/23/2015   PLT 765 (H) 08/23/2015      Chemistry      Component Value Date/Time   NA 139 07/19/2015 1620   K 3.7 07/19/2015 1620   CL 104 07/19/2015 1620   CO2 28 07/19/2015 1620   BUN 11 07/19/2015 1620   CREATININE 0.39 (L) 07/19/2015 1620  Component Value Date/Time   CALCIUM 9.5 07/19/2015 1620   ALKPHOS 125 (H) 07/19/2015 1620   AST 16 07/19/2015 1620   ALT 49 (H) 07/19/2015 1620   BILITOT 1.5 (H) 07/19/2015 1620     ASSESSMENT & PLAN:  Essential thrombocytosis ([place]) I had a long discussion with the patient and her daughter. Peripheral blood for JAK2 mutation came back positive.  I suspect the patient may have essential thrombocytosis. She was started on hydroxyurea with mild improvement of her blood count. I recommend she continues hydroxyurea 1000 mg daily and reassess in 2 weeks We discussed prognosis with MPN and we discussed side effects of hydroxyurea in length She will continue on chronic anticoagulation therapy  History of pulmonary embolism The patient had history of extensive PE and DVT, likely due to undiagnosed essential thrombocytosis She will remain on chronic anticoagulation therapy indefinitely  Spastic hemiparesis affecting dominant side ([place]), right She had history of recurrent stroke in the past. She is currently on medical management and chronic anticoagulation therapy. The patient is debilitated and is dependent  on her daughter for all activities of daily living   No orders of the defined types were placed in this encounter.  All questions were answered. The patient knows to call the clinic with any problems, questions or concerns. No barriers to learning was detected. I spent 15 minutes counseling the patient face to face. The total time spent in the appointment was 20 minutes and more than 50% was on counseling and review of test results     [facility], [place], MD 08/23/2015 3:56 PM

## 2015-08-24 LAB — URINE CULTURE
Culture: >=100000 — AB
Special Requests: NORMAL

## 2015-08-25 ENCOUNTER — Other Ambulatory Visit: Payer: Self-pay | Admitting: Family Medicine

## 2015-08-25 NOTE — Progress Notes (Signed)
MAU PROVIDER NOTE:  Urine culture finalized, +E. Coli. Sensitive to bactrim that was sent to pharmacy for patient at visit on 08/22/15. Appropriate treatment, no further action needed.  Zenda Alpers, DO  [facility], [facility] 08/25/2015, 8:46 AM

## 2015-08-26 ENCOUNTER — Ambulatory Visit: Payer: Medicare Other | Admitting: Physical Therapy

## 2015-08-27 ENCOUNTER — Ambulatory Visit (INDEPENDENT_AMBULATORY_CARE_PROVIDER_SITE_OTHER): Payer: Medicare Other | Admitting: General Practice

## 2015-08-27 DIAGNOSIS — I639 Cerebral infarction, unspecified: Secondary | ICD-10-CM

## 2015-08-27 LAB — POCT INR: INR: 1.9

## 2015-08-27 NOTE — Progress Notes (Addendum)
I have reviewed and agree with the plan.     I have reviewed and agree with the plan.   [name], MD

## 2015-08-28 ENCOUNTER — Ambulatory Visit: Payer: Medicare Other | Admitting: Physical Therapy

## 2015-08-28 DIAGNOSIS — R2689 Other abnormalities of gait and mobility: Secondary | ICD-10-CM | POA: Diagnosis not present

## 2015-08-28 DIAGNOSIS — I69351 Hemiplegia and hemiparesis following cerebral infarction affecting right dominant side: Secondary | ICD-10-CM | POA: Diagnosis not present

## 2015-08-28 DIAGNOSIS — R2681 Unsteadiness on feet: Secondary | ICD-10-CM | POA: Diagnosis not present

## 2015-08-28 NOTE — Therapy (Signed)
[facility] [place] [address] [place], [place], [address] Phone: [phone]   Fax:  [phone]  Physical Therapy Treatment  Patient Details  Name: Sandy Barrett MRN: [id number] Date of Birth: [birth year] Referring Provider: [name], MD  Encounter Date: 08/28/2015      PT End of Session - 08/28/15 1725    Visit Number 5   Number of Visits 9   Date for PT Re-Evaluation 09/06/15   Authorization Type Medicare Traditional primary; BCBS secondary   Authorization Time Period G Codes required   PT Start Time 1318   PT Stop Time 1359   PT Time Calculation (min) 41 min   Activity Tolerance Patient limited by fatigue;Patient limited by pain  Pain in L knee   Behavior During Therapy [facility] for tasks assessed/performed      Past Medical History:  Diagnosis Date  . ARTHRITIS   . Arthritis   . Diverticulosis   . HYPERTENSION   . HYPERTHYROIDISM   . Hyperthyroidism    s/p I-131 ablation 03/2011 of multinod goiter  . INSOMNIA   . MIGRAINE HEADACHE   . OBESITY   . Posttraumatic stress disorder   . Pulmonary embolism ([place]) 03/2014   with DVT  . Stroke [facility]) 03/2014   dysarthria    Past Surgical History:  Procedure Laterality Date  . ABDOMINAL HYSTERECTOMY  1976  . BREAST SURGERY     biopsy  . RADIOLOGY WITH ANESTHESIA Left 03/08/2014   Procedure: RADIOLOGY WITH ANESTHESIA;  Surgeon: Rob Hickman, MD;  Location: [place];  Service: Radiology;  Laterality: Left;  . TONSILLECTOMY    . TOTAL HIP ARTHROPLASTY  1998    right    There were no vitals filed for this visit.      Subjective Assessment - 08/28/15 1720    Subjective Daughter reports no significant changes.   Patient is accompained by: Family member  daughter, Sandy Barrett   Pertinent History PMH significant for: L MCA CVA (03/2014) with severe R spastic hemiplegia, global aphasia, and apraxia; HTN, HLD hyperthyroidism, OA, PE with DVT (03/2014), R THA.    Patient Stated Goals Per daughter, "Strengthen her legs in a more active way - maybe use the machines (NuStep) and try some standing. I'm not interrested in working on transfers, because I can't physically help her with that at home."   Currently in Pain? No/denies  6/10 pain (per FACES scale) in L knee during attempted transfers                         [facility] Adult PT Treatment/Exercise - 08/28/15 0001      Transfers   Sit to Stand 1: +1 Total assist   Sit to Stand Details (indicate cue type and reason) Attempted sit > partial stand from w/c at // bars; total A for setup, anterior weight shift. Unable to complete due to pt not actively participating in transfer.   Lateral/Scoot Transfer Details (indicate cue type and reason) Attempted lateral scoot transfer (level) from w/c > mat table, initially with SB with no success due to non-verbal signs of pain in L knee. Therefore, attempted lateral scoot transfer without SB, but pt nearly in tears, despite L knee being repositioning (unsure if due to L knee pain vs. fear of falling).     Ambulation/Gait   Ambulation/Gait No  Pt non-ambulatory     Neuro Re-ed    Neuro Re-ed Details  Sci Fit x8  minutes, no resistance with LUE and BLE's for initial 7 minutes, with B LE's only for final minute with encouragement from PT. Performed w/c propulsion forward x10' with LLE only for NMR; tactile cueing for WB, demo cueing for technique.                PT Education - 08/28/15 1720    Education provided Yes   Education Details Plan for trial of power w/c at next session to determine appropriate w/c for patient.    Person(s) Educated Patient;Child(ren)   Methods Explanation   Comprehension Verbalized understanding          PT Short Term Goals - 08/16/15 1410      PT SHORT TERM GOAL #1   Title Patient will participate in > 75% of initial 2 PT sessions to indicate patient is an appropriate candidate for outpatient PT.  (Target  date: 08/13/15)   Baseline Met 08/16/15.   Status Achieved     PT SHORT TERM GOAL #2   Title Patient will tolerate standing with total A of standing frame for >/= 5 miinutes to indicate increased standing tolerance. (08/13/15)   Baseline 8/1: tolerated standing with total A of standing frame x9.5 minutes   Status Achieved     PT SHORT TERM GOAL #3   Title Pt will perform NuStep for >/= 5 minutes (lower extremities only) to indicate increased activity tolerance.  (08/13/15)   Baseline 8/11: Pt performed NuStep x10 minutes (with LUE and BLE's x9 minutes, with LE's only x1 minute)   Status Partially Met           PT Long Term Goals - 07/23/15 2006      PT LONG TERM GOAL #1   Title Patient will tolerate standing with total A of standing frame for >/= 15 miinutes to indicate significant improvment in standing tolerance.  (Target date: 09/02/15)     PT LONG TERM GOAL #2   Title Pt will perform NuStep for >/= 10 minutes (lower extremities only) to indicate increased activity tolerance.  (09/02/15)     PT LONG TERM GOAL #3   Title Pt will participate in standing with use of body weight support system to achieve family's goal of increasing supported standing tolerance.  (09/02/15)               Plan - 08/28/15 1730    Clinical Impression Statement Unable to utilize standing frame during this session due to inability to safely transfer pt out of w/c. Transfers limited by pain in L knee. Therefore, performed Sci-Fit while seated in personal w/c.    Rehab Potential Fair   Clinical Impairments Affecting Rehab Potential chronicity of R hemiplegia; R inattention; global aphasia; and chronic knee pain   PT Frequency Other (comment)  8 total sessions   PT Duration 8 weeks   PT Treatment/Interventions ADLs/Self Care Home Management;Neuromuscular re-education;Functional mobility training;Patient/family education;Therapeutic activities;Balance training;Orthotic Fit/Training;Therapeutic exercise   PT  Next Visit Plan Trial use of power w/c.  Plan to cancel w/c evaluation if power chair not appropriate   Consulted and Agree with Plan of Care Patient;Family member/caregiver   Family Member Consulted daughter, Sandy Barrett      Patient will benefit from skilled therapeutic intervention in order to improve the following deficits and impairments:  Decreased cognition, Pain, Other (comment), Decreased balance, Decreased activity tolerance, Decreased endurance, Decreased coordination, Decreased mobility, Impaired sensation, Impaired tone, Impaired UE functional use, Decreased strength, Postural dysfunction (PT will monitor pain but will  not directly address due to nature of PT referral.)  Visit Diagnosis: Hemiplegia and hemiparesis following cerebral infarction affecting right dominant side ([employer])  Other abnormalities of gait and mobility     Problem List Patient Active Problem List   Diagnosis Date Noted  . Essential thrombocytosis ([place]) 07/31/2015  . SVC (superior vena cava obstruction), chronic 07/20/2015  . Dysphagia 07/20/2015  . Elevated liver enzymes 07/20/2015  . Constipation 07/20/2015  . Thrombophlebitis of breast, right 06/18/2015  . Tracheal deviation 06/06/2015  . Sensorineural hearing loss, bilateral, moderate-moderately severe 04/30/2015  . Abdominal wall lump 03/09/2015  . Frequent UTI 02/06/2015  . Primary osteoarthritis involving multiple joints 07/26/2014  . Pernicious anemia 07/26/2014  . Hearing loss 07/26/2014  . Encounter for therapeutic drug monitoring 07/17/2014  . Spastic hemiparesis affecting dominant side ([place]), right 05/31/2014  . DVT of lower extremity, bilateral ([place]) 03/14/2014  . Global aphasia 03/14/2014  . 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 Aphasia due to stroke 03/13/2014  . Cerebral infarction due to embolism of left middle cerebral artery ([place])   . Stroke, embolic ([place]) 63/81/7711  . History of pulmonary embolism 03/08/2014  . Back pain 08/29/2013   . Primary localized osteoarthrosis, lower leg 03/06/2013  . IBS (irritable bowel syndrome)   . Multinodular goiter 01/13/2011  . Hyperthyroidism 11/11/2009  . Insomnia 07/03/2009  . POSTTRAUMATIC STRESS DISORDER 08/22/2008  . OBESITY 04/21/2008  . Osteoarthritis 04/21/2008  . MIGRAINE HEADACHE 04/20/2008  . Essential hypertension 04/20/2008    Billie Ruddy, PT, DPT [facility] [address] [place], [place], [address] Phone: [phone]   Fax:  [phone] 08/28/15, 5:33 PM  Name: Sandy Barrett MRN: [id number] Date of Birth: 11-May-1932

## 2015-09-02 ENCOUNTER — Encounter: Payer: Self-pay | Admitting: Nurse Practitioner

## 2015-09-02 ENCOUNTER — Ambulatory Visit (INDEPENDENT_AMBULATORY_CARE_PROVIDER_SITE_OTHER): Payer: Medicare Other | Admitting: Nurse Practitioner

## 2015-09-02 ENCOUNTER — Ambulatory Visit: Payer: Medicare Other | Admitting: Physical Therapy

## 2015-09-02 VITALS — BP 110/70 | HR 82 | Ht 60.0 in

## 2015-09-02 DIAGNOSIS — I639 Cerebral infarction, unspecified: Secondary | ICD-10-CM

## 2015-09-02 DIAGNOSIS — R2681 Unsteadiness on feet: Secondary | ICD-10-CM | POA: Diagnosis not present

## 2015-09-02 DIAGNOSIS — I63312 Cerebral infarction due to thrombosis of left middle cerebral artery: Secondary | ICD-10-CM

## 2015-09-02 DIAGNOSIS — R2689 Other abnormalities of gait and mobility: Secondary | ICD-10-CM

## 2015-09-02 DIAGNOSIS — I69351 Hemiplegia and hemiparesis following cerebral infarction affecting right dominant side: Secondary | ICD-10-CM

## 2015-09-02 DIAGNOSIS — G8110 Spastic hemiplegia affecting unspecified side: Secondary | ICD-10-CM

## 2015-09-02 DIAGNOSIS — I69320 Aphasia following cerebral infarction: Secondary | ICD-10-CM | POA: Diagnosis not present

## 2015-09-02 DIAGNOSIS — IMO0002 Reserved for concepts with insufficient information to code with codable children: Secondary | ICD-10-CM

## 2015-09-02 DIAGNOSIS — I10 Essential (primary) hypertension: Secondary | ICD-10-CM | POA: Diagnosis not present

## 2015-09-02 NOTE — Therapy (Addendum)
[facility] [address] [place], [place], [address] Phone: [phone]   Fax:  [phone]  Physical Therapy Treatment  Patient Details  Name: Sandy Barrett MRN: [id number] Date of Birth: November 28, 1932 Referring Provider: [name], MD  Encounter Date: 09/02/2015      PT End of Session - 09/02/15 1419    Visit Number 6   Number of Visits 14  requesting 8 additional sessions   Date for PT Re-Evaluation 10/17/15   Authorization Type Medicare Traditional primary; BCBS secondary   Authorization Time Period G Codes required   PT Start Time 1319   PT Stop Time 1401   PT Time Calculation (min) 42 min   Activity Tolerance Patient tolerated treatment well   Behavior During Therapy [facility] for tasks assessed/performed      Past Medical History:  Diagnosis Date  . ARTHRITIS   . Arthritis   . Diverticulosis   . HYPERTENSION   . HYPERTHYROIDISM   . Hyperthyroidism    s/p I-131 ablation 03/2011 of multinod goiter  . INSOMNIA   . MIGRAINE HEADACHE   . OBESITY   . Posttraumatic stress disorder   . Pulmonary embolism ([place]) 03/2014   with DVT  . Stroke [facility]) 03/2014   dysarthria    Past Surgical History:  Procedure Laterality Date  . ABDOMINAL HYSTERECTOMY  1976  . BREAST SURGERY     biopsy  . RADIOLOGY WITH ANESTHESIA Left 03/08/2014   Procedure: RADIOLOGY WITH ANESTHESIA;  Surgeon: Rob Hickman, MD;  Location: [facility];  Service: Radiology;  Laterality: Left;  . TONSILLECTOMY    . TOTAL HIP ARTHROPLASTY  1998    right    There were no vitals filed for this visit.      Subjective Assessment - 09/02/15 1405    Subjective Patient arrived accompanied by daughter, who states, "We'll all excited about trying the power wheelchair."   Patient is accompained by: --  daughter, Sandy Barrett   Pertinent History PMH significant for: L MCA CVA (03/2014) with severe R spastic hemiplegia, global aphasia, and apraxia; HTN, HLD  hyperthyroidism, OA, PE with DVT (03/2014), R THA.   Patient Stated Goals Per daughter, "Strengthen her legs in a more active way - maybe use the machines (NuStep) and try some standing. I'm not interrested in working on transfers, because I can't physically help her with that at home."   Currently in Pain? No/denies            [facility] PT Assessment - 09/02/15 0001      Assessment   Medical Diagnosis Poor balance; physical deconditioning; Cerebrovascular accident (CVA) due to thrombosis of left middle cerebral artery    Referring Provider [name], MD   Onset Date/Surgical Date 03/08/14     Prior Function   Level of Independence Independent  prior to CVA 03/08/14   Vocation Retired                     [facility] Adult PT Treatment/Exercise - 09/02/15 0001      Transfers   Squat Pivot Transfers 1: +2 Total assist   Squat Pivot Transfers: Patient Percentage 10%   Squat Pivot Transfer Details (indicate cue type and reason) from standard w/c <> power w/c      Ambulation/Gait   Ambulation/Gait No  Pt non-ambulatory     Chief Technology Officer Yes   Wheelchair Assistance 2: Max assist;1: +1 Total assist   Wheelchair  Assistance Details Manual facilitation for placement;Tactile cues for initiation;Verbal cues for safe use of DME/AE   Wheelchair Propulsion Left upper extremity   Wheelchair Parts Management Needs assistance   Distance 40'  Approx. 15 trials with 180-degree turns   Comments Pt utilized power w/c with L joystick for multiple 40' trials, initially requiring total A, L HOH assist for steering. Barriers to successful use of power w/c included R visual attention, pt difficulty moving joystick to L. ATP and w/c specialist, Sandy Barrett, plans to bring larger circular joystick to increase pt control of w/c. Pt initially required total to utilize joystick. At end of session, pt able to stop on command and required max A for use of power w/c.                 PT Education - 09/02/15 1406    Education provided Yes   Education Details Plan to shift focus of PT to pt operating power wheelchair. Will modify POC to 2x/week for 4 weeks followed by 1x/week for 4 weeks.   Person(s) Educated Patient;Child(ren)   Methods Explanation   Comprehension Verbalized understanding          PT Short Term Goals - 09/02/15 1435      PT SHORT TERM GOAL #1   Title Pt will safely drive power w/c for 696' over level, indoor surfaces with mod A, 50% cueing for R visual attention.  (Target: 09/23/15)   Status New     PT SHORT TERM GOAL #2   Title While using power chair, pt will consistently perform 180-degree turns with mod A, 50% cueing to indicate increased independence with w/c management.    (Target: 09/23/15)   Status New     PT SHORT TERM GOAL #3   Title Pt will perform 360-degree turns with mod A, 50% cueing to indicate increased independence with w/c management.    (Target: 09/23/15)   Status New           PT Long Term Goals - 09/02/15 1439      PT LONG TERM GOAL #1   Title Pt will safely drive power w/c for 295' over level, indoor surfaces with min guard, 25% cueing for R visual attention. (10/14/15)   Status New     PT LONG TERM GOAL #2   Title While using power chair, pt will consistently perform 180-degree turns with min guard, 25% cueing to indicate increased independence with w/c management.   (10/14/15)   Status New     PT LONG TERM GOAL #3   Title Pt will perform 360-degree turns with min guard, 25% cueing to indicate increased independence with w/c management.   (10/14/15)   Status New     PT LONG TERM GOAL #4   Title Complete assessment for power w/c vs. tilt-in-space w/c to  preserve skin integrity and maximize pt independence with mobility.  (10/14/15)   Status New      Updated Goals:     PT Short Term Goals - 09/02/15 1435      PT SHORT TERM GOAL #1   Title Pt will safely drive power w/c for 284' over  level, indoor surfaces with mod A, 50% cueing for R visual attention.  (Target: 09/23/15)     PT SHORT TERM GOAL #2   Title While using power chair, pt will consistently perform 180-degree turns with mod A, 50% cueing to indicate increased independence with w/c management.    (Target: 09/23/15)  PT SHORT TERM GOAL #3   Title Pt will perform 360-degree turns with mod A, 50% cueing to indicate increased independence with w/c management.    (Target: 09/23/15)          PT Long Term Goals - 09/02/15 1439      PT LONG TERM GOAL #1   Title Pt will safely drive power w/c for 570' over level, indoor surfaces with min guard, 25% cueing for R visual attention. (10/14/15)     PT LONG TERM GOAL #2   Title While using power chair, pt will consistently perform 180-degree turns with min guard, 25% cueing to indicate increased independence with w/c management.   (10/14/15)     PT LONG TERM GOAL #3   Title Pt will perform 360-degree turns with min guard, 25% cueing to indicate increased independence with w/c management.   (10/14/15)     PT LONG TERM GOAL #4   Title Complete assessment for power w/c vs. tilt-in-spasce w/c to  preserve skin integrity and maximize pt independence with mobility.  (10/14/15)              Plan - 09/02/15 1428    Clinical Impression Statement Patient has met or partially met 2 of 3 LTG's. Current session focused on trial of power w/c. ATP Sandy Barrett present for this session. Pt progressed from total A to max A for use of L joystick for use of power w/c. Barriers include decreased attention to R visual field and apraxia. Noted within-session improvement. After discussion with this PT and wheelchair rep and ATP, daugher in full agreement with continuing PT for 8 additional sessions over a 6-week period to increase pt independence with use of power w/c.   Rehab Potential Fair   Clinical Impairments Affecting Rehab Potential chronicity of R hemiplegia; R inattention; global aphasia;  and chronic knee pain   PT Frequency Other (comment)  8 sessions   PT Duration 6 weeks   PT Treatment/Interventions ADLs/Self Care Home Management;Neuromuscular re-education;Functional mobility training;Patient/family education;Therapeutic activities;Balance training;Orthotic Fit/Training;Therapeutic exercise;Wheelchair mobility training   PT Next Visit Plan Continue to practice use of power w/c   Consulted and Agree with Plan of Care Patient;Family member/caregiver   Family Member Consulted daughter, Sandy Barrett      Patient will benefit from skilled therapeutic intervention in order to improve the following deficits and impairments:  Decreased cognition, Decreased coordination, Decreased mobility, Impaired tone, Impaired UE functional use, Decreased knowledge of use of DME, Impaired sensation  Visit Diagnosis: Hemiplegia and hemiparesis following cerebral infarction affecting right dominant side ([place]) - Plan: PT plan of care cert/re-cert  Other abnormalities of gait and mobility - Plan: PT plan of care cert/re-cert     Problem List Patient Active Problem List   Diagnosis Date Noted  . Essential thrombocytosis ([place]) 07/31/2015  . SVC (superior vena cava obstruction), chronic 07/20/2015  . Dysphagia 07/20/2015  . Elevated liver enzymes 07/20/2015  . Constipation 07/20/2015  . Thrombophlebitis of breast, right 06/18/2015  . Tracheal deviation 06/06/2015  . Sensorineural hearing loss, bilateral, moderate-moderately severe 04/30/2015  . Abdominal wall lump 03/09/2015  . Frequent UTI 02/06/2015  . Primary osteoarthritis involving multiple joints 07/26/2014  . Pernicious anemia 07/26/2014  . Hearing loss 07/26/2014  . Encounter for therapeutic drug monitoring 07/17/2014  . Spastic hemiparesis affecting dominant side ([place]), right 05/31/2014  . DVT of lower extremity, bilateral ([place]) 03/14/2014  . Global aphasia 03/14/2014  . 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 Aphasia due to stroke  03/13/2014  . Cerebral infarction due to embolism of left middle cerebral artery ([place])   . Stroke, embolic ([place]) 34/03/7094  . History of pulmonary embolism 03/08/2014  . Back pain 08/29/2013  . Primary localized osteoarthrosis, lower leg 03/06/2013  . IBS (irritable bowel syndrome)   . Multinodular goiter 01/13/2011  . Hyperthyroidism 11/11/2009  . Insomnia 07/03/2009  . POSTTRAUMATIC STRESS DISORDER 08/22/2008  . OBESITY 04/21/2008  . Osteoarthritis 04/21/2008  . MIGRAINE HEADACHE 04/20/2008  . Essential hypertension 04/20/2008   Billie Ruddy, PT, DPT [facility] [address] [place], [place], [address] Phone: [phone]   Fax:  [phone] 09/02/15, 8:43 PM  Name: Sandy Barrett MRN: [id number] Date of Birth: 1932-12-03

## 2015-09-02 NOTE — Patient Instructions (Addendum)
Continue warfarin for stroke prevention given history of pulmonary embolism maintain strict control of hypertension with blood pressure goal below 140/90, today's reading 118/70 lipids with LDL cholesterol goal below 70 mg percent. Continue range of motion exercises Important to get patient up every day Follow-up in 6 months

## 2015-09-02 NOTE — Progress Notes (Signed)
[facility]  PATIENT: Sandy Barrett DOB: [birth year]   REASON FOR VISIT: Follow-up for stroke 03/08/2014 HISTORY FROM: Daughter Sandy Barrett    HISTORY OF PRESENT ILLNESS: HISTORY PS81 year African-American lady who seen today for first office follow-up visit following hospital admission for stroke on 03/08/14. She is accompanied by 2 of her daughters. Patient presented with sudden onset of speech difficulties, right-sided weakness and facial droop. Her last known well was unknown when she had talked to her sister on the phone and she went for had appointment and 1 PM and had is immediately noted that she was confused and having speech difficulties. She presented to the emergency room with aphasia and right hemiparesis and right hemianopsia. Last seen normal was outside the window for TPA upon initial determination hence CT scan with perfusion was obtained which showed a large ischemic penumbra in the left MCA territory. After several telephonic discussions with family members it was determined that the last seen normal  Determined initially was incorrect inpatient was within the timeframe for tPA which was given without complications.. Patient however had widened mediastinum on chest x-ray requiring a CT scan of the chest to rule out aortic dissection and she was subsequently found to have a large thyroid mass with bilateral pulmonary emboli. IV TPA was administered. She was found to have left terminal carotid artery occlusion for which she underwent unsuccessful attempt at revascularization with mechanical embolectomy by Dr. the mesh were due to technical difficulties vessel could not be opened. She was admitted to intensive care unit and was initially intubated. She remained globally aphasic with dense right hemiplegia and visual field loss. 2-D echo showed normal ejection fraction but there was presence of interatrial septal aneurysm but bubble study and TEE were not done. She was  also found to have extensive bilateral deep and thrombosis and pulmonary emboli and hence despite the large stroke was started cautiously on IV heparin without bolus. MRI scan confirmed subsequently a moderate size left MCA infarct with nonhemorrhagic transformation. There are also tiny punctate foci of right MCA infarcts. MRA showed occluded terminal left ICA and MCA. Carotid ultrasound showed no significant extracranial stenosis but abnormal left ICA waveform suggestive of distal occlusion. Patient was found to have a large intrathoracic goiter with displacement of the trachea, aortic branches and superior vena cava with sluggish flow in the left jugular vein but no thrombus. LDL cholesterol is 53 hemoglobin A1c was 5.4. Protein C and S activity were normal and presented, and was negative. Telemetry monitoring did not reveal atrial fibrillation. Patient was transferred for rehabilitation and is currently living in a nursing home where she is on warfarin which is tolerating well without any bleeding complications. She remains aphasic with dense right hemiplegia. She is able to assist with transfers and can stand up with the one-person assist with physical therapy and gait belt walk with little. The family is getting ready to transfer the patient home over the next few weeks and have a lot of questions about her care and prognosis which  I answered. Update 02/18/2015 : She returns for follow-up after last visit in May 2016 and is a complaint by 2 of her daughters. She remains neurologically stable without recurrent stroke or TIA symptoms however the family has noticed that she often stares off into space and points towards things and they feel that she may be having visual hallucinations. She does not appear to be distress with this. These seem to be more pronounced in the  last 3 weeks following her urinary tract infection. She was initially treated with Cipro followed by a different antibiotic. Patient remains  wheelchair bound and is total care and family provides 24-hour care at home. She remains on Coumadin for her pulmonary embolism and is tolerating well without bleeding or bruising. She has not had any falls, head injury, or seizures that have been noted UPDATE August  28th 2017CM Sandy Barrett, 82 year old female returns for follow-up with daughter Sandy Barrett with whom she lives. She remains neurologically stable without recurrent stroke or TIA symptoms. She remains on Coumadin for secondary stroke prevention and previous history of pulmonary emboli. Dementia labs at last visit were normal except for elevated TSH. Her thyroid medication was reduced. EEG in the office without evidence of seizure activity and mild generalized slowing which is nonspecific. Abnormal MRI brain (without) demonstrating:03/20/15  1. Few foci of left basal ganglia acute-subacute infarctions vs artifact due to relative signal difference compared to adjacent tissues. 2. Large chronic left MCA ischemic infarction with encephalomalacia and gliosis affecting the left frontal, parietal and temporal regions. Dystrophic calcification vs hemosiderin deposition in the left basal ganglia. Wallerian degeneration and atrophy of the left cerebral peduncle.  3. Occluded left ICA with decreased branching within the left MCA.  4. Compared to MRI on 03/09/14, there has been expected evolutional change of left MCA infarction. In addition, there may be a few areas of acute-subacute infarcts in the left basal ganglia vs artifact.  She returns for reevaluation. Daughter has CNA daily to assist with activities of daily living.  REVIEW OF SYSTEMS: Full 14 system review of systems performed and notable only for those listed, all others are neg:  Constitutional: neg  Cardiovascular: neg Ear/Nose/Throat: neg  Skin: neg Eyes: neg Respiratory: neg Gastroitestinal: neg  Genitourinary incontinence of bowel and bladder Hematology/Lymphatic: Easy bruising    Endocrine: neg Musculoskeletal:neg Allergy/Immunology: neg Neurological: Speech difficulty Psychiatric: neg Sleep : neg   ALLERGIES: No Known Allergies  HOME MEDICATIONS: Outpatient Medications Prior to Visit  Medication Sig Dispense Refill  . acetaminophen (TYLENOL) 325 MG tablet Take 1-2 tablets (325-650 mg total) by mouth every 4 (four) hours as needed for mild pain.    [name] albuterol (PROVENTIL HFA) 108 (90 Base) MCG/ACT inhaler Inhale 2 puffs into the lungs every 6 (six) hours as needed for wheezing or shortness of breath. 1 Inhaler 1  . diclofenac sodium (VOLTAREN) 1 % GEL APPLY (2GMS) TOPICALLY THREE TIMES DAILY. (Patient taking differently: APPLY (2GMS) TOPICALLY THREE TIMES DAILY AS NEEDED FOR PAIN) 300 g 3  . gabapentin (NEURONTIN) 100 MG capsule Take 1 capsule (100 mg total) by mouth 3 (three) times daily. 90 capsule 5  . hydroxyurea (HYDREA) 500 MG capsule Take 2 capsules (1,000 mg total) by mouth daily. May take with food to minimize GI side effects. 60 capsule 9  . methimazole (TAPAZOLE) 5 MG tablet Take 1 tablet (5 mg total) by mouth 2 (two) times a week. 12 tablet 2  . pantoprazole sodium (PROTONIX) 40 mg/20 mL PACK Take 20 mLs (40 mg total) by mouth daily. 30 each 5  . polyethylene glycol (MIRALAX) packet Take 17 g by mouth 2 (two) times daily. 14 each 0  . Probiotic Product (ALIGN) 4 MG CAPS Take 1 capsule by mouth daily.     . propranolol (INDERAL) 10 MG tablet Take 1 tablet (10 mg total) by mouth 2 (two) times daily. 60 tablet 3  . terconazole (TERAZOL 7) 0.4 % vaginal cream Place 1 applicator  vaginally at bedtime. 90 g 3  . traMADol (ULTRAM) 50 MG tablet TAKE 1 TO 2 TABLETS BY MOUTH 3 TIMES DAILY AS NEEDED (Patient taking differently: TAKE 1 TO 2 TABLETS BY MOUTH 3 TIMES DAILY AS NEEDED FOR PAIN) 180 tablet 1  . traZODone (DESYREL) 50 MG tablet Take 1 tablet (50 mg total) by mouth at bedtime as needed. for sleep 30 tablet 5  . warfarin (COUMADIN) 4 MG tablet Take as  directed by anticoagulation clinic. (Patient taking differently: Take as directed by anticoagulation clinic. Patient is taking 5 mg daily as 08/22/15. Verify dosage with patient upon admitting, changes weekly based on INR.) 40 tablet 3  . sulfamethoxazole-trimethoprim (BACTRIM DS,SEPTRA DS) 800-160 MG tablet Take 1 tablet by mouth 2 (two) times daily. 14 tablet 0   Facility-Administered Medications Prior to Visit  Medication Dose Route Frequency Provider Last Rate Last Dose  . albuterol (PROVENTIL) (2.5 MG/3ML) 0.083% nebulizer solution 2.5 mg  2.5 mg Nebulization Once Burnard Hawthorne, FNP        PAST MEDICAL HISTORY: Past Medical History:  Diagnosis Date  . ARTHRITIS   . Arthritis   . Diverticulosis   . HYPERTENSION   . HYPERTHYROIDISM   . Hyperthyroidism    s/p I-131 ablation 03/2011 of multinod goiter  . INSOMNIA   . MIGRAINE HEADACHE   . OBESITY   . Posttraumatic stress disorder   . Pulmonary embolism ([place]) 03/2014   with DVT  . Stroke [facility]) 03/2014   dysarthria    PAST SURGICAL HISTORY: Past Surgical History:  Procedure Laterality Date  . ABDOMINAL HYSTERECTOMY  1976  . BREAST SURGERY     biopsy  . RADIOLOGY WITH ANESTHESIA Left 03/08/2014   Procedure: RADIOLOGY WITH ANESTHESIA;  Surgeon: Rob Hickman, MD;  Location: [facility];  Service: Radiology;  Laterality: Left;  . TONSILLECTOMY    . TOTAL HIP ARTHROPLASTY  1998    right    FAMILY HISTORY: Family History  Problem Relation Age of Onset  . Asthma Mother   . Asthma Father   . Prostate cancer Father   . Stroke Brother   . Breast cancer Sister   . Stomach cancer Sister   . Thyroid disease Neg Hx     SOCIAL HISTORY: Social History   Social History  . Marital status: Widowed    Spouse name: N/A  . Number of children: 2  . Years of education: 14   Occupational History  . retired     Restaurant manager, fast food   Social History Main Topics  . Smoking status: Never Smoker  . Smokeless tobacco: Never Used  .  Alcohol use No  . Drug use: No  . Sexual activity: No   Other Topics Concern  . Not on file   Social History Narrative   Widowed, lived alone prior to CVA 03/2014   SNF at [facility], then home 07/2014 with dtr   Right handed   Caffeine use- occasionally drinks tea     PHYSICAL EXAM  Vitals:   09/02/15 1412  BP: 110/70  Pulse: 82  Height: 5' (1.524 m)   There is no height or weight on file to calculate BMI. General: Obese elderly African-American lady, seated, in no evident distress Head: head normocephalic and atraumatic.  Neck: supple with no carotid  bruits Cardiovascular: regular rate and rhythm, no murmurs Musculoskeletal: no deformity. Right foot drop and wears foot brace and right hand brace Skin:  no rash/petichiae. Pedal edema 2+ bilateral,  compression hose in place Vascular:  Normal pulses all extremities   Neurological examination  Mental Status: Awake and fully alert. Globally aphasic and will follow only occasional midline commands and mimicking. Speech is fluent but nonsensical. Will not follow any commands consistently.  Cranial Nerves: Fundoscopic exam reveals sharp disc margins. Pupils equal, briskly reactive to light. Extraocular movements full without nystagmus. Visual fields show dense right homonymous hemianopsia to threat Hearing intact. Facial sensation intact. Mild right lower facial weakness., tongue, palate moves normally and symmetrically.  Motor: Dense spastic right hemiplegia with 0/5 strength with right foot drop and hand and wrist flexion deformity. Increased tone on the right. Normal antigravity strength on the left. Sensory.: intact to touch ,pinprick .on the left but impaired on the right  Coordination: Accurate on the left and cannot be tested on the right.  Gait and Station: Not tested as patient sitting in wheelchair  Reflexes: 2+ and asymmetric much brisker on the right side Toes downgoing.   DIAGNOSTIC DATA (LABS, IMAGING,  TESTING) - I reviewed patient records, labs, notes, testing and imaging myself where available.  Lab Results  Component Value Date   WBC 6.5 08/23/2015   HGB 13.5 08/23/2015   HCT 40.9 08/23/2015   MCV 94.0 08/23/2015   PLT 765 (H) 08/23/2015      Component Value Date/Time   NA 139 07/19/2015 1620   K 3.7 07/19/2015 1620   CL 104 07/19/2015 1620   CO2 28 07/19/2015 1620   GLUCOSE 78 07/19/2015 1620   BUN 11 07/19/2015 1620   CREATININE 0.39 (L) 07/19/2015 1620   CALCIUM 9.5 07/19/2015 1620   PROT 6.8 07/19/2015 1620   ALBUMIN 3.6 07/19/2015 1620   AST 16 07/19/2015 1620   ALT 49 (H) 07/19/2015 1620   ALKPHOS 125 (H) 07/19/2015 1620   BILITOT 1.5 (H) 07/19/2015 1620   GFRNONAA >60 06/27/2015 0454   GFRAA >60 06/27/2015 0454     Lab Results  Component Value Date   TSH 2.39 08/02/2015      ASSESSMENT AND PLAN 83year lady with large left middle cerebral artery infarct secondary to occlusion of the left internal carotid artery with concurrent bilateral lower extremity deep and thrombosis and pulmonary embolism with strong suspicion for paradoxical emboli in March 2016 treated with IV TPA and failed mechanical thrombectomy with significant residual disability. Possible visual hallucinations noted since the stroke may represent hemianopic hallucinations.Dementia labs at last visit were normal except for elevated TSH. Her thyroid medication was reduced. EEG in the office without evidence of seizure activity and mild generalized slowing which is nonspecific. Abnormal MRI brain (without) demonstrating:03/20/15 Few foci of left basal ganglia acute-subacute infarctions vs artifact due to relative signal difference compared to adjacent tissues.. Large chronic left MCA ischemic infarction with encephalomalacia and gliosis affecting the left frontal, parietal and temporal regions. Dystrophic calcification vs hemosiderin deposition in the left basal ganglia. Wallerian degeneration and atrophy  of the left cerebral peduncle. Occluded left ICA with decreased branching within the left MCA. Compared to MRI on 03/09/14, there has been expected evolutional change of left MCA infarction. In addition, there may be a few areas of acute-subacute infarcts in the left basal ganglia vs artifact.  Continue warfarin for stroke prevention given history of pulmonary embolism maintain strict control of hypertension with blood pressure goal below 140/90, today's reading 118/70 lipids with LDL cholesterol goal below 70 mg percent. Continue range of motion exercises Important to get patient up every day Greater than 50% of  time during this 25 minute visit was spent on counseling,explanation and education of diagnosis, planning of further management, discussion with patient and daughter coordination of care Follow-up in 6 months Dennie Bible, [facility], [employer], [facility] [address], [place], [place] [address] [phone]

## 2015-09-03 ENCOUNTER — Other Ambulatory Visit: Payer: Self-pay | Admitting: Hematology and Oncology

## 2015-09-03 ENCOUNTER — Other Ambulatory Visit: Payer: Self-pay | Admitting: Internal Medicine

## 2015-09-03 ENCOUNTER — Telehealth: Payer: Self-pay | Admitting: Emergency Medicine

## 2015-09-03 NOTE — Telephone Encounter (Signed)
RX faxed to [facility] 

## 2015-09-04 ENCOUNTER — Encounter: Payer: Self-pay | Admitting: Hematology and Oncology

## 2015-09-04 ENCOUNTER — Telehealth: Payer: Self-pay | Admitting: Hematology and Oncology

## 2015-09-04 ENCOUNTER — Ambulatory Visit (HOSPITAL_BASED_OUTPATIENT_CLINIC_OR_DEPARTMENT_OTHER): Payer: Medicare Other | Admitting: Hematology and Oncology

## 2015-09-04 ENCOUNTER — Ambulatory Visit: Payer: Medicare Other | Admitting: Physical Therapy

## 2015-09-04 ENCOUNTER — Other Ambulatory Visit (HOSPITAL_BASED_OUTPATIENT_CLINIC_OR_DEPARTMENT_OTHER): Payer: Medicare Other

## 2015-09-04 DIAGNOSIS — I69351 Hemiplegia and hemiparesis following cerebral infarction affecting right dominant side: Secondary | ICD-10-CM | POA: Diagnosis not present

## 2015-09-04 DIAGNOSIS — I82403 Acute embolism and thrombosis of unspecified deep veins of lower extremity, bilateral: Secondary | ICD-10-CM | POA: Diagnosis not present

## 2015-09-04 DIAGNOSIS — D473 Essential (hemorrhagic) thrombocythemia: Secondary | ICD-10-CM

## 2015-09-04 DIAGNOSIS — R2689 Other abnormalities of gait and mobility: Secondary | ICD-10-CM | POA: Diagnosis not present

## 2015-09-04 DIAGNOSIS — Z7901 Long term (current) use of anticoagulants: Secondary | ICD-10-CM

## 2015-09-04 DIAGNOSIS — R2681 Unsteadiness on feet: Secondary | ICD-10-CM | POA: Diagnosis not present

## 2015-09-04 LAB — CBC WITH DIFFERENTIAL/PLATELET
BASO%: 2.4 % — ABNORMAL HIGH (ref 0.0–2.0)
Basophils Absolute: 0.1 10*3/uL (ref 0.0–0.1)
EOS%: 3.3 % (ref 0.0–7.0)
Eosinophils Absolute: 0.2 10*3/uL (ref 0.0–0.5)
HCT: 42.7 % (ref 34.8–46.6)
HGB: 14 g/dL (ref 11.6–15.9)
LYMPH%: 29.8 % (ref 14.0–49.7)
MCH: 32.3 pg (ref 25.1–34.0)
MCHC: 32.8 g/dL (ref 31.5–36.0)
MCV: 98.4 fL (ref 79.5–101.0)
MONO#: 0.4 10*3/uL (ref 0.1–0.9)
MONO%: 8.2 % (ref 0.0–14.0)
NEUT#: 2.8 10*3/uL (ref 1.5–6.5)
NEUT%: 56.3 % (ref 38.4–76.8)
Platelets: 403 10*3/uL — ABNORMAL HIGH (ref 145–400)
RBC: 4.34 10*6/uL (ref 3.70–5.45)
RDW: 20.9 % — ABNORMAL HIGH (ref 11.2–14.5)
WBC: 4.9 10*3/uL (ref 3.9–10.3)
lymph#: 1.5 10*3/uL (ref 0.9–3.3)
nRBC: 0 % (ref 0–0)

## 2015-09-04 NOTE — Assessment & Plan Note (Signed)
The patient had history of extensive PE and DVT, likely due to undiagnosed essential thrombocytosis She will remain on chronic anticoagulation therapy indefinitely I reassure her daughter that her current treatment would not interfere with her anticoagulation therapy. She may remain on warfarin as well as her platelet count is greater than 50,000

## 2015-09-04 NOTE — Telephone Encounter (Signed)
GAVE PATIENT AVS REPORT AND APPOINTMENTS FOR September.  °

## 2015-09-04 NOTE — Addendum Note (Signed)
Addended by: Billie Ruddy A on: 09/04/2015 05:33 PM   Modules accepted: Orders

## 2015-09-04 NOTE — Assessment & Plan Note (Signed)
I had a long discussion with the patient and her daughter. Peripheral blood for JAK2 mutation came back positive.  I suspect the patient may have essential thrombocytosis. She was started on hydroxyurea with gradual improvement of her blood count. I recommend she continues hydroxyurea 1000 mg daily and reassess in 3 weeks We discussed prognosis with MPN and we discussed side effects of hydroxyurea in length She will continue on chronic anticoagulation therapy

## 2015-09-04 NOTE — Therapy (Signed)
[facility] [address] [place], [place], [address] Phone: [phone]   Fax:  [phone]  Physical Therapy Treatment  Patient Details  Name: Sandy Barrett MRN: [id number] Date of Birth: [birth year] Referring Provider: [name], MD  Encounter Date: 09/04/2015      PT End of Session - 09/04/15 1725    Visit Number 7   Number of Visits 14  per recert   Date for PT Re-Evaluation 10/17/15   Authorization Type Medicare Traditional primary; BCBS secondary   Authorization Time Period G Codes required   PT Start Time 1320   PT Stop Time 1400   PT Time Calculation (min) 40 min   Activity Tolerance Patient tolerated treatment well   Behavior During Therapy [facility] for tasks assessed/performed      Past Medical History:  Diagnosis Date  . ARTHRITIS   . Arthritis   . Diverticulosis   . HYPERTENSION   . HYPERTHYROIDISM   . Hyperthyroidism    s/p I-131 ablation 03/2011 of multinod goiter  . INSOMNIA   . MIGRAINE HEADACHE   . OBESITY   . Posttraumatic stress disorder   . Pulmonary embolism ([place]) 03/2014   with DVT  . Stroke [facility]) 03/2014   dysarthria    Past Surgical History:  Procedure Laterality Date  . ABDOMINAL HYSTERECTOMY  1976  . BREAST SURGERY     biopsy  . RADIOLOGY WITH ANESTHESIA Left 03/08/2014   Procedure: RADIOLOGY WITH ANESTHESIA;  Surgeon: Rob Hickman, MD;  Location: [facility];  Service: Radiology;  Laterality: Left;  . TONSILLECTOMY    . TOTAL HIP ARTHROPLASTY  1998    right    There were no vitals filed for this visit.      Subjective Assessment - 09/04/15 1715    Subjective Pt arrived to session accompanied by daughter. No significant changes to report.   Patient is accompained by: Family member  daughter, [name]   Pertinent History PMH significant for: L MCA CVA (03/2014) with severe R spastic hemiplegia, global aphasia, and apraxia; HTN, HLD hyperthyroidism, OA, PE with DVT  (03/2014), R THA.   Patient Stated Goals Per daughter, "Strengthen her legs in a more active way - maybe use the machines (NuStep) and try some standing. I'm not interrested in working on transfers, because I can't physically help her with that at home."   Currently in Pain? No/denies                         [facility] Adult PT Treatment/Exercise - 09/04/15 0001      Transfers   Transfers Squat Pivot Transfers   Squat Pivot Transfers 1: +2 Total assist   Squat Pivot Transfers: Patient Percentage 10%   Squat Pivot Transfer Details (indicate cue type and reason) from patient's manual w/c <> power w/c     Ambulation/Gait   Ambulation/Gait No  Pt non-ambulatory     Wheelchair Mobility   Wheelchair Mobility Yes   Wheelchair Assistance 2: Max assist;1: +1 Total assist;3: Mod assist  mod-max for linear; total A for turning 180-degrees   Wheelchair Assistance Details Manual facilitation for placement;Verbal cues for technique;Verbal cues for precautions/safety;Verbal cues for safe use of DME/AE   Wheelchair Propulsion Left upper extremity;Power   Wheelchair Parts Management Needs assistance   Distance 420  total   Comments Pt utilized LUE to drive power w/c for 30' trials x14 reps total with 180-degree turns at end of each  trial. PT placed colored tape on L control pad in area where joystick should point to drive straight, as pt exhibits tendency to veer to R side. Pt responded to command to "stop" within 5-seconds of command >50% of the time, which is markedly improved from previous session. Note that treatment gym was much less crowded, less noisy today as compared with previous session. Pt required total A for 180-degree turns. To drive w/c linearly, pt required max A at beginning of session and mod A at end of session.                   PT Short Term Goals - 09/02/15 1435      PT SHORT TERM GOAL #1   Title Pt will safely drive power w/c for [id number]' over level, indoor  surfaces with mod A, 50% cueing for R visual attention.  (Target: 09/23/15)   Status New     PT SHORT TERM GOAL #2   Title While using power chair, pt will consistently perform 180-degree turns with mod A, 50% cueing to indicate increased independence with w/c management.    (Target: 09/23/15)   Status New     PT SHORT TERM GOAL #3   Title Pt will perform 360-degree turns with mod A, 50% cueing to indicate increased independence with w/c management.    (Target: 09/23/15)   Status New           PT Long Term Goals - 09/02/15 1439      PT LONG TERM GOAL #1   Title Pt will safely drive power w/c for [id number]' over level, indoor surfaces with min guard, 25% cueing for R visual attention. (10/14/15)   Status New     PT LONG TERM GOAL #2   Title While using power chair, pt will consistently perform 180-degree turns with min guard, 25% cueing to indicate increased independence with w/c management.   (10/14/15)   Status New     PT LONG TERM GOAL #3   Title Pt will perform 360-degree turns with min guard, 25% cueing to indicate increased independence with w/c management.   (10/14/15)   Status New     PT LONG TERM GOAL #4   Title Complete assessment for power w/c vs. tilt-in-space w/c to  preserve skin integrity and maximize pt independence with mobility.  (10/14/15)   Status New               Plan - 09/04/15 1726    Clinical Impression Statement Session focused on increasing pt safety with driving power w/c. Pt requires total A for 180-degree turning. To drive power w/c linearly, pt progressed from max to mod A within this session.    Rehab Potential Fair   Clinical Impairments Affecting Rehab Potential chronicity of R hemiplegia; R inattention; global aphasia; and chronic knee pain   PT Frequency Other (comment)  8 sessions   PT Duration 6 weeks   PT Treatment/Interventions ADLs/Self Care Home Management;Neuromuscular re-education;Functional mobility training;Patient/family  education;Therapeutic activities;Balance training;Orthotic Fit/Training;Therapeutic exercise;Wheelchair mobility training   PT Next Visit Plan Continue to practice use of power w/c   Consulted and Agree with Plan of Care Patient;Family member/caregiver   Family Member Consulted daughter, [name]      Patient will benefit from skilled therapeutic intervention in order to improve the following deficits and impairments:  Decreased cognition, Decreased coordination, Decreased mobility, Impaired tone, Impaired UE functional use, Decreased knowledge of use of DME, Impaired sensation  Visit Diagnosis: Hemiplegia  and hemiparesis following cerebral infarction affecting right dominant side (HCC)  Other abnormalities of gait and mobility     Problem List Patient Active Problem List   Diagnosis Date Noted  . Essential thrombocytosis ([place]) 07/31/2015  . SVC (superior vena cava obstruction), chronic 07/20/2015  . Dysphagia 07/20/2015  . Elevated liver enzymes 07/20/2015  . Constipation 07/20/2015  . Thrombophlebitis of breast, right 06/18/2015  . Tracheal deviation 06/06/2015  . Sensorineural hearing loss, bilateral, moderate-moderately severe 04/30/2015  . Abdominal wall lump 03/09/2015  . Frequent UTI 02/06/2015  . Primary osteoarthritis involving multiple joints 07/26/2014  . Pernicious anemia 07/26/2014  . Hearing loss 07/26/2014  . Encounter for therapeutic drug monitoring 07/17/2014  . Spastic hemiparesis affecting dominant side ([place]), right 05/31/2014  . DVT of lower extremity, bilateral ([place]) 03/14/2014  . Global aphasia 03/14/2014  . 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 Aphasia due to stroke 03/13/2014  . Cerebral infarction due to embolism of left middle cerebral artery ([place])   . Stroke, embolic ([place]) [id number]  . History of pulmonary embolism 03/08/2014  . Back pain 08/29/2013  . Primary localized osteoarthrosis, lower leg 03/06/2013  . IBS (irritable bowel syndrome)   .  Multinodular goiter 01/13/2011  . Hyperthyroidism 11/11/2009  . Insomnia 07/03/2009  . POSTTRAUMATIC STRESS DISORDER 08/22/2008  . OBESITY 04/21/2008  . Osteoarthritis 04/21/2008  . MIGRAINE HEADACHE 04/20/2008  . Essential hypertension 04/20/2008   Billie Ruddy, PT, DPT [facility] [address] [place], [place], [address] Phone: [phone]   Fax:  [phone] 09/04/15, 5:34 PM  Name: Sandy Barrett MRN: [id number] Date of Birth: 1932-01-21

## 2015-09-04 NOTE — Progress Notes (Signed)
[place] OFFICE PROGRESS NOTE  Patient Care Team: [name], MD as PCP - General (Internal Medicine) Eunice Blase, MD (Inactive) as Consulting Physician (Family Medicine) Renato Shin, MD as Consulting Physician (Endocrinology) Netta Cedars, MD as Consulting Physician (Orthopedic Surgery) Lyndal Pulley, DO (Sports Medicine) Garvin Fila, MD (Neurology)  SUMMARY OF ONCOLOGIC HISTORY:   Essential thrombocytosis ([place])   08/07/2013 Miscellaneous    The patient is noted to have elevated platelet count      03/08/2014 Imaging    Positive for acute PE with CT evidence of right heart strain (RV/LV Ratio = 0.9) consistent with at least submassive (intermediate risk)PE.       03/09/2014 Imaging    US venous Doppler showed deep vein thrombosis noted in the right distal common femoral vein, femoral vein, and popliteal vein. DVT noted in the left femoral and popliteal veins      03/09/2014 Imaging    Patchy areas of acute left MCA territory infarction. 2. Several punctate foci of acute right MCA territory infarction. Possible trace subarachnoid hemorrhage in the high right frontal lobe. 3. Occluded left ICA and left MCA      03/20/2015 Imaging    Compared to MRI on 03/09/14, there has been expected evolutional change of left MCA infarction. In addition, there may be a few areas of acute-subacute infarcts in the left basal ganglia vs artifact.        07/31/2015 Pathology Results    Peripheral blood is positive for JAK2 mutation      07/31/2015 -  Chemotherapy    She is started on 500 mg daily Hydrea      08/15/2015 Miscellaneous    The dose of hydroxyurea is increased to 1000 mg daily       INTERVAL HISTORY: Please see below for problem oriented charting. She returns today with her daughter for follow-up. She is doing well. The daughter has to break the capsules of Hydrea and give her the chemotherapy by mixing with applesauce. She denies recent infection. She  continues on anticoagulation therapy. The patient denies any recent signs or symptoms of bleeding such as spontaneous epistaxis, hematuria or hematochezia.   REVIEW OF SYSTEMS:   Constitutional: Denies fevers, chills or abnormal weight loss Eyes: Denies blurriness of vision Ears, nose, mouth, throat, and face: Denies mucositis or sore throat Respiratory: Denies cough, dyspnea or wheezes Cardiovascular: Denies palpitation, chest discomfort or lower extremity swelling Gastrointestinal:  Denies nausea, heartburn or change in bowel habits Skin: Denies abnormal skin rashes Lymphatics: Denies new lymphadenopathy or easy bruising Neurological:Denies numbness, tingling or new weaknesses Behavioral/Psych: Mood is stable, no new changes  All other systems were reviewed with the patient and are negative.  I have reviewed the past medical history, past surgical history, social history and family history with the patient and they are unchanged from previous note.  ALLERGIES:  has No Known Allergies.  MEDICATIONS:  Current Outpatient Prescriptions  Medication Sig Dispense Refill  . acetaminophen (TYLENOL) 325 MG tablet Take 1-2 tablets (325-650 mg total) by mouth every 4 (four) hours as needed for mild pain.    [name] albuterol (PROVENTIL HFA) 108 (90 Base) MCG/ACT inhaler Inhale 2 puffs into the lungs every 6 (six) hours as needed for wheezing or shortness of breath. 1 Inhaler 1  . diclofenac sodium (VOLTAREN) 1 % GEL APPLY (2GMS) TOPICALLY THREE TIMES DAILY. (Patient taking differently: APPLY (2GMS) TOPICALLY THREE TIMES DAILY AS NEEDED FOR PAIN) 300 g 3  .  gabapentin (NEURONTIN) 100 MG capsule Take 1 capsule (100 mg total) by mouth 3 (three) times daily. 90 capsule 5  . hydroxyurea (HYDREA) 500 MG capsule Take 2 capsules (1,000 mg total) by mouth daily. May take with food to minimize GI side effects. 60 capsule 9  . methimazole (TAPAZOLE) 5 MG tablet Take 1 tablet (5 mg total) by mouth 2 (two) times a  week. 12 tablet 2  . pantoprazole sodium (PROTONIX) 40 mg/20 mL PACK Take 20 mLs (40 mg total) by mouth daily. 30 each 5  . polyethylene glycol (MIRALAX) packet Take 17 g by mouth 2 (two) times daily. (Patient taking differently: Take 17 g by mouth 2 (two) times daily as needed. ) 14 each 0  . Probiotic Product (ALIGN) 4 MG CAPS Take 1 capsule by mouth daily.     . propranolol (INDERAL) 10 MG tablet Take 1 tablet (10 mg total) by mouth 2 (two) times daily. (Patient taking differently: Take 10 mg by mouth daily. ) 60 tablet 3  . terconazole (TERAZOL 7) 0.4 % vaginal cream Place 1 applicator vaginally at bedtime. 90 g 3  . traMADol (ULTRAM) 50 MG tablet Take 1-2 tablets by mouth 3 times daily as needed 180 tablet 1  . traZODone (DESYREL) 50 MG tablet Take 1 tablet (50 mg total) by mouth at bedtime as needed. for sleep 30 tablet 5  . warfarin (COUMADIN) 4 MG tablet Take as directed by anticoagulation clinic. (Patient taking differently: Take as directed by anticoagulation clinic. Patient is taking 5 mg daily as 08/22/15. Verify dosage with patient upon admitting, changes weekly based on INR.) 40 tablet 3   Current Facility-Administered Medications  Medication Dose Route Frequency Provider Last Rate Last Dose  . albuterol (PROVENTIL) (2.5 MG/3ML) 0.083% nebulizer solution 2.5 mg  2.5 mg Nebulization Once Burnard Hawthorne, FNP        PHYSICAL EXAMINATION: ECOG PERFORMANCE STATUS: 2 - Symptomatic, <50% confined to bed  Vitals:   09/04/15 1500  BP: (!) 103/57  Pulse: 80  Resp: 17  Temp: 98.3 F (36.8 C)   There were no vitals filed for this visit.  GENERAL:alert, no distress and comfortable SKIN: skin color, texture, turgor are normal, no rashes or significant lesions EYES: normal, Conjunctiva are pink and non-injected, sclera clear Musculoskeletal:no cyanosis of digits and no clubbing  NEURO: alert & oriented x 3 with Dysarthria and persistent left-sided weakness  LABORATORY DATA:  I  have reviewed the data as listed    Component Value Date/Time   NA 139 07/19/2015 1620   K 3.7 07/19/2015 1620   CL 104 07/19/2015 1620   CO2 28 07/19/2015 1620   GLUCOSE 78 07/19/2015 1620   BUN 11 07/19/2015 1620   CREATININE 0.39 (L) 07/19/2015 1620   CALCIUM 9.5 07/19/2015 1620   PROT 6.8 07/19/2015 1620   ALBUMIN 3.6 07/19/2015 1620   AST 16 07/19/2015 1620   ALT 49 (H) 07/19/2015 1620   ALKPHOS 125 (H) 07/19/2015 1620   BILITOT 1.5 (H) 07/19/2015 1620   GFRNONAA >60 06/27/2015 0454   GFRAA >60 06/27/2015 0454    No results found for: SPEP, UPEP  Lab Results  Component Value Date   WBC 4.9 09/04/2015   NEUTROABS 2.8 09/04/2015   HGB 14.0 09/04/2015   HCT 42.7 09/04/2015   MCV 98.4 09/04/2015   PLT 403 (H) 09/04/2015      Chemistry      Component Value Date/Time   NA 139 07/19/2015 1620  K 3.7 07/19/2015 1620   CL 104 07/19/2015 1620   CO2 28 07/19/2015 1620   BUN 11 07/19/2015 1620   CREATININE 0.39 (L) 07/19/2015 1620      Component Value Date/Time   CALCIUM 9.5 07/19/2015 1620   ALKPHOS 125 (H) 07/19/2015 1620   AST 16 07/19/2015 1620   ALT 49 (H) 07/19/2015 1620   BILITOT 1.5 (H) 07/19/2015 1620       ASSESSMENT & PLAN:  Essential thrombocytosis ([place]) I had a long discussion with the patient and her daughter. Peripheral blood for JAK2 mutation came back positive.  I suspect the patient may have essential thrombocytosis. She was started on hydroxyurea with gradual improvement of her blood count. I recommend she continues hydroxyurea 1000 mg daily and reassess in 3 weeks We discussed prognosis with MPN and we discussed side effects of hydroxyurea in length She will continue on chronic anticoagulation therapy  DVT of lower extremity, bilateral ([facility]) The patient had history of extensive PE and DVT, likely due to undiagnosed essential thrombocytosis She will remain on chronic anticoagulation therapy indefinitely I reassure her daughter that her  current treatment would not interfere with her anticoagulation therapy. She may remain on warfarin as well as her platelet count is greater than 50,000   No orders of the defined types were placed in this encounter.  All questions were answered. The patient knows to call the clinic with any problems, questions or concerns. No barriers to learning was detected. I spent 15 minutes counseling the patient face to face. The total time spent in the appointment was 20 minutes and more than 50% was on counseling and review of test results     [facility], Saharra Santo, MD 09/04/2015 4:01 PM

## 2015-09-05 ENCOUNTER — Telehealth: Payer: Self-pay | Admitting: Emergency Medicine

## 2015-09-05 NOTE — Progress Notes (Signed)
I agree with the above plan 

## 2015-09-05 NOTE — Telephone Encounter (Signed)
Please advise 

## 2015-09-05 NOTE — Telephone Encounter (Signed)
Pts daughter called and wants to know if its ok to decrease her gabapentin (NEURONTIN) 100 MG capsule from 3 times day to 2 times a day. Please advise thanks.

## 2015-09-05 NOTE — Telephone Encounter (Signed)
Spoke with pts daughter to inform.  

## 2015-09-05 NOTE — Telephone Encounter (Signed)
Yes, ok 

## 2015-09-06 NOTE — Telephone Encounter (Signed)
Error

## 2015-09-11 ENCOUNTER — Ambulatory Visit: Payer: Medicare Other | Admitting: Podiatry

## 2015-09-12 ENCOUNTER — Telehealth: Payer: Self-pay | Admitting: *Deleted

## 2015-09-12 NOTE — Telephone Encounter (Signed)
Pt's daughter left VM informing Dr. Alvy Bimler that pt has skin peeling on her shoulders and chest. She thinks it started around the same time pt started on Hydrea and asks if this a side effect of Hydrea and is there anything that can help?

## 2015-09-13 ENCOUNTER — Telehealth: Payer: Self-pay | Admitting: *Deleted

## 2015-09-13 ENCOUNTER — Ambulatory Visit (HOSPITAL_BASED_OUTPATIENT_CLINIC_OR_DEPARTMENT_OTHER): Payer: Medicare Other | Admitting: Hematology and Oncology

## 2015-09-13 ENCOUNTER — Telehealth: Payer: Self-pay | Admitting: General Practice

## 2015-09-13 DIAGNOSIS — D473 Essential (hemorrhagic) thrombocythemia: Secondary | ICD-10-CM | POA: Diagnosis not present

## 2015-09-13 DIAGNOSIS — L309 Dermatitis, unspecified: Secondary | ICD-10-CM

## 2015-09-13 DIAGNOSIS — N390 Urinary tract infection, site not specified: Secondary | ICD-10-CM

## 2015-09-13 MED ORDER — NITROFURANTOIN 25 MG/5ML PO SUSP: 100.0000 mg | Freq: Two times a day (BID) | ORAL | 0 refills | Status: AC

## 2015-09-13 MED ORDER — HYDROCORTISONE 1 % EX OINT: 1.0000 "application " | TOPICAL_OINTMENT | Freq: Two times a day (BID) | CUTANEOUS | 0 refills | Status: DC

## 2015-09-13 NOTE — Telephone Encounter (Signed)
Patient's daughter called requesting recent test results. Called patient's daughter & informed her of results showing UTI. No yeast infection. She verbalized understanding & states her mother completed the medicine but her urine is starting to smell strong again. Per Dr Nehemiah Settle, may send in macrobid 100mg  BID x 7. Informed patient's daughter who states that her mother cannot take pills. Will send in liquid med and informed her. She verbalized understanding & had no questions

## 2015-09-13 NOTE — Telephone Encounter (Signed)
Spoke with daughter. She would like to bring patient in today at 1:30.

## 2015-09-13 NOTE — Telephone Encounter (Signed)
Skin peeling is not an expected side-effect of Hydrea Recommendation/choice  1) Take picture & hold Hydrea until next appt 2) come in today for assessment at 130 pm (if she can make it, let me know)

## 2015-09-14 ENCOUNTER — Encounter: Payer: Self-pay | Admitting: Hematology and Oncology

## 2015-09-14 NOTE — Assessment & Plan Note (Signed)
Skin exam is most consistent with dermatitis. I recommend topical steroid cream and I will reassess again when she returns in 2 weeks.

## 2015-09-14 NOTE — Progress Notes (Signed)
[place] OFFICE PROGRESS NOTE  Patient Care Team: [name], MD as PCP - General (Internal Medicine) Eunice Blase, MD (Inactive) as Consulting Physician (Family Medicine) Renato Shin, MD as Consulting Physician (Endocrinology) Netta Cedars, MD as Consulting Physician (Orthopedic Surgery) Lyndal Pulley, DO (Sports Medicine) Garvin Fila, MD (Neurology)  SUMMARY OF ONCOLOGIC HISTORY:   Essential thrombocytosis ([place])   08/07/2013 Miscellaneous    The patient is noted to have elevated platelet count      03/08/2014 Imaging    Positive for acute PE with CT evidence of right heart strain (RV/LV Ratio = 0.9) consistent with at least submassive (intermediate risk)PE.       03/09/2014 Imaging    US venous Doppler showed deep vein thrombosis noted in the right distal common femoral vein, femoral vein, and popliteal vein. DVT noted in the left femoral and popliteal veins      03/09/2014 Imaging    Patchy areas of acute left MCA territory infarction. 2. Several punctate foci of acute right MCA territory infarction. Possible trace subarachnoid hemorrhage in the high right frontal lobe. 3. Occluded left ICA and left MCA      03/20/2015 Imaging    Compared to MRI on 03/09/14, there has been expected evolutional change of left MCA infarction. In addition, there may be a few areas of acute-subacute infarcts in the left basal ganglia vs artifact.        07/31/2015 Pathology Results    Peripheral blood is positive for JAK2 mutation      07/31/2015 -  Chemotherapy    She is started on 500 mg daily Hydrea      08/15/2015 Miscellaneous    The dose of hydroxyurea is increased to 1000 mg daily       INTERVAL HISTORY: Please see below for problem oriented charting. She is seen per family request due to new onset of skin rashes. Her daughter is wondering whether this could be related to hydroxyurea It does not cause major distress such as severe itching. There were no  reported skin bleeding. Her daughter did remember recent use of new detergent.  REVIEW OF SYSTEMS:   Constitutional: Denies fevers, chills or abnormal weight loss Eyes: Denies blurriness of vision Ears, nose, mouth, throat, and face: Denies mucositis or sore throat Respiratory: Denies cough, dyspnea or wheezes Cardiovascular: Denies palpitation, chest discomfort or lower extremity swelling Gastrointestinal:  Denies nausea, heartburn or change in bowel habits Lymphatics: Denies new lymphadenopathy or easy bruising Neurological:Denies numbness, tingling or new weaknesses Behavioral/Psych: Mood is stable, no new changes  All other systems were reviewed with the patient and are negative.  I have reviewed the past medical history, past surgical history, social history and family history with the patient and they are unchanged from previous note.  ALLERGIES:  has No Known Allergies.  MEDICATIONS:  Current Outpatient Prescriptions  Medication Sig Dispense Refill  . acetaminophen (TYLENOL) 325 MG tablet Take 1-2 tablets (325-650 mg total) by mouth every 4 (four) hours as needed for mild pain.    [name] diclofenac sodium (VOLTAREN) 1 % GEL APPLY (2GMS) TOPICALLY THREE TIMES DAILY. (Patient taking differently: APPLY (2GMS) TOPICALLY THREE TIMES DAILY AS NEEDED FOR PAIN) 300 g 3  . gabapentin (NEURONTIN) 100 MG capsule Take 1 capsule (100 mg total) by mouth 3 (three) times daily. 90 capsule 5  . hydroxyurea (HYDREA) 500 MG capsule Take 2 capsules (1,000 mg total) by mouth daily. May take with food to minimize GI side  effects. 60 capsule 9  . methimazole (TAPAZOLE) 5 MG tablet Take 1 tablet (5 mg total) by mouth 2 (two) times a week. 12 tablet 2  . nitrofurantoin (FURADANTIN) 25 MG/5ML suspension Take 20 mLs (100 mg total) by mouth 2 (two) times daily. 280 mL 0  . pantoprazole sodium (PROTONIX) 40 mg/20 mL PACK Take 20 mLs (40 mg total) by mouth daily. 30 each 5  . polyethylene glycol (MIRALAX) packet  Take 17 g by mouth 2 (two) times daily. (Patient taking differently: Take 17 g by mouth 2 (two) times daily as needed. ) 14 each 0  . Probiotic Product (ALIGN) 4 MG CAPS Take 1 capsule by mouth daily.     . propranolol (INDERAL) 10 MG tablet Take 1 tablet (10 mg total) by mouth 2 (two) times daily. (Patient taking differently: Take 10 mg by mouth daily. ) 60 tablet 3  . terconazole (TERAZOL 7) 0.4 % vaginal cream Place 1 applicator vaginally at bedtime. 90 g 3  . traMADol (ULTRAM) 50 MG tablet Take 1-2 tablets by mouth 3 times daily as needed 180 tablet 1  . traZODone (DESYREL) 50 MG tablet Take 1 tablet (50 mg total) by mouth at bedtime as needed. for sleep 30 tablet 5  . warfarin (COUMADIN) 4 MG tablet Take as directed by anticoagulation clinic. (Patient taking differently: Take as directed by anticoagulation clinic. Patient is taking 5 mg daily as 08/22/15. Verify dosage with patient upon admitting, changes weekly based on INR.) 40 tablet 3  . albuterol (PROVENTIL HFA) 108 (90 Base) MCG/ACT inhaler Inhale 2 puffs into the lungs every 6 (six) hours as needed for wheezing or shortness of breath. (Patient not taking: Reported on 09/13/2015) 1 Inhaler 1  . hydrocortisone 1 % ointment Apply 1 application topically 2 (two) times daily. 30 g 0   Current Facility-Administered Medications  Medication Dose Route Frequency Provider Last Rate Last Dose  . albuterol (PROVENTIL) (2.5 MG/3ML) 0.083% nebulizer solution 2.5 mg  2.5 mg Nebulization Once Burnard Hawthorne, FNP        PHYSICAL EXAMINATION: ECOG PERFORMANCE STATUS: 1 - Symptomatic but completely ambulatory  Vitals:   09/13/15 1418 09/13/15 1420  BP: (!) 85/62 92/61  Pulse: 78   Resp: 18   Temp: 98.6 F (37 C)    Filed Weights    GENERAL:alert, no distress and comfortable SKIN: Noted skin rash resembling dermatitis near her bra strap bilaterally and at the back as well as her face EYES: normal, Conjunctiva are pink and non-injected, sclera  clear OROPHARYNX:no exudate, no erythema and lips, buccal mucosa, and tongue normal  NECK: supple, thyroid normal size, non-tender, without nodularity LYMPH:  no palpable lymphadenopathy in the cervical, axillary or inguinal LUNGS: clear to auscultation and percussion with normal breathing effort HEART: regular rate & rhythm and no murmurs and no lower extremity edema ABDOMEN:abdomen soft, non-tender and normal bowel sounds Musculoskeletal:no cyanosis of digits and no clubbing  NEURO: alert & oriented x 3, Dysarthria and persistent neurological deficit  LABORATORY DATA:  I have reviewed the data as listed    Component Value Date/Time   NA 139 07/19/2015 1620   K 3.7 07/19/2015 1620   CL 104 07/19/2015 1620   CO2 28 07/19/2015 1620   GLUCOSE 78 07/19/2015 1620   BUN 11 07/19/2015 1620   CREATININE 0.39 (L) 07/19/2015 1620   CALCIUM 9.5 07/19/2015 1620   PROT 6.8 07/19/2015 1620   ALBUMIN 3.6 07/19/2015 1620   AST 16 07/19/2015 1620  ALT 49 (H) 07/19/2015 1620   ALKPHOS 125 (H) 07/19/2015 1620   BILITOT 1.5 (H) 07/19/2015 1620   GFRNONAA >60 06/27/2015 0454   GFRAA >60 06/27/2015 0454    No results found for: SPEP, UPEP  Lab Results  Component Value Date   WBC 4.9 09/04/2015   NEUTROABS 2.8 09/04/2015   HGB 14.0 09/04/2015   HCT 42.7 09/04/2015   MCV 98.4 09/04/2015   PLT 403 (H) 09/04/2015      Chemistry      Component Value Date/Time   NA 139 07/19/2015 1620   K 3.7 07/19/2015 1620   CL 104 07/19/2015 1620   CO2 28 07/19/2015 1620   BUN 11 07/19/2015 1620   CREATININE 0.39 (L) 07/19/2015 1620      Component Value Date/Time   CALCIUM 9.5 07/19/2015 1620   ALKPHOS 125 (H) 07/19/2015 1620   AST 16 07/19/2015 1620   ALT 49 (H) 07/19/2015 1620   BILITOT 1.5 (H) 07/19/2015 1620            ASSESSMENT & PLAN:  Essential thrombocytosis ([place]) She tolerated hydroxyurea well. Continue the same  Dermatitis Skin exam is most consistent with dermatitis. I  recommend topical steroid cream and I will reassess again when she returns in 2 weeks.   No orders of the defined types were placed in this encounter.  All questions were answered. The patient knows to call the clinic with any problems, questions or concerns. No barriers to learning was detected. I spent 15 minutes counseling the patient face to face. The total time spent in the appointment was 20 minutes and more than 50% was on counseling and review of test results     [facility], [place], MD 09/14/2015 2:02 PM

## 2015-09-14 NOTE — Assessment & Plan Note (Signed)
She tolerated hydroxyurea well. Continue the same

## 2015-09-23 ENCOUNTER — Ambulatory Visit: Payer: Medicare Other | Attending: Internal Medicine | Admitting: Physical Therapy

## 2015-09-23 DIAGNOSIS — I69351 Hemiplegia and hemiparesis following cerebral infarction affecting right dominant side: Secondary | ICD-10-CM

## 2015-09-23 DIAGNOSIS — R2689 Other abnormalities of gait and mobility: Secondary | ICD-10-CM | POA: Insufficient documentation

## 2015-09-23 NOTE — Therapy (Signed)
[facility] [address] [place], [place], [address] Phone: [phone]   Fax:  [phone]  Physical Therapy Treatment  Patient Details  Name: Sandy Barrett MRN: [id number] Date of Birth: Mar 13, 1932 Referring Provider: [name], MD  Encounter Date: 09/23/2015      PT End of Session - 09/23/15 1502    Visit Number 8   Number of Visits 14   Authorization Type Medicare Traditional primary; BCBS secondary   Authorization Time Period G Codes required   PT Start Time 1405   PT Stop Time 1444   PT Time Calculation (min) 39 min   Activity Tolerance Patient tolerated treatment well   Behavior During Therapy [facility] for tasks assessed/performed      Past Medical History:  Diagnosis Date  . ARTHRITIS   . Arthritis   . Diverticulosis   . HYPERTENSION   . HYPERTHYROIDISM   . Hyperthyroidism    s/p I-131 ablation 03/2011 of multinod goiter  . INSOMNIA   . MIGRAINE HEADACHE   . OBESITY   . Posttraumatic stress disorder   . Pulmonary embolism ([place]) 03/2014   with DVT  . Stroke [facility]) 03/2014   dysarthria    Past Surgical History:  Procedure Laterality Date  . ABDOMINAL HYSTERECTOMY  1976  . BREAST SURGERY     biopsy  . RADIOLOGY WITH ANESTHESIA Left 03/08/2014   Procedure: RADIOLOGY WITH ANESTHESIA;  Surgeon: Rob Hickman, MD;  Location: [facility];  Service: Radiology;  Laterality: Left;  . TONSILLECTOMY    . TOTAL HIP ARTHROPLASTY  1998    right    There were no vitals filed for this visit.      Subjective Assessment - 09/23/15 1450    Subjective Per daughter, "I've been asking mom if she likes the wheelchair and if she wants to get a power chair...she keeps saying 'yes'."   Patient is accompained by: Family member  daughter, [name]   Pertinent History PMH significant for: L MCA CVA (03/2014) with severe R spastic hemiplegia, global aphasia, and apraxia; HTN, HLD hyperthyroidism, OA, PE with DVT  (03/2014), R THA.   Patient Stated Goals Per daughter, "Strengthen her legs in a more active way - maybe use the machines (NuStep) and try some standing. I'm not interrested in working on transfers, because I can't physically help her with that at home."   Currently in Pain? No/denies                         [facility] Adult PT Treatment/Exercise - 09/23/15 0001      Transfers   Transfers Squat Pivot Transfers   Sit to Stand --   Squat Pivot Transfers 1: +2 Total assist   Squat Pivot Transfers: Patient Percentage 10%   Squat Pivot Transfer Details (indicate cue type and reason) from personal manual w/c <> power w/c with +2A, multimodal cueing for full anterior weight shift, for foot placement.     Chief Technology Officer Yes   Wheelchair Assistance 3: Mod assist;2: Max Camera operator facilitation for placement;Verbal cues for technique;Verbal cues for precautions/safety;Verbal cues for safe use of DME/AE   Wheelchair Propulsion Left upper extremity   Wheelchair Parts Management Needs assistance   Distance 550   Comments Pt utilized LUE to drive power w/c [id number]' over level, indoor surfaces with mod A during linear driving, mod cueing (tactile) for 45 to 90-degree turning, and  max A for turning 180 degrees. When turning 180 degrees or 360 degrees, pt requires total A to initiate, mod-max A to stop turning.                  PT Short Term Goals - 09/02/15 1435      PT SHORT TERM GOAL #1   Title Pt will safely drive power w/c for [id number]' over level, indoor surfaces with mod A, 50% cueing for R visual attention.  (Target: 09/23/15)   Status New     PT SHORT TERM GOAL #2   Title While using power chair, pt will consistently perform 180-degree turns with mod A, 50% cueing to indicate increased independence with w/c management.    (Target: 09/23/15)   Status New     PT SHORT TERM GOAL #3   Title Pt will perform 360-degree  turns with mod A, 50% cueing to indicate increased independence with w/c management.    (Target: 09/23/15)   Status New           PT Long Term Goals - 09/02/15 1439      PT LONG TERM GOAL #1   Title Pt will safely drive power w/c for [id number]' over level, indoor surfaces with min guard, 25% cueing for R visual attention. (10/14/15)   Status New     PT LONG TERM GOAL #2   Title While using power chair, pt will consistently perform 180-degree turns with min guard, 25% cueing to indicate increased independence with w/c management.   (10/14/15)   Status New     PT LONG TERM GOAL #3   Title Pt will perform 360-degree turns with min guard, 25% cueing to indicate increased independence with w/c management.   (10/14/15)   Status New     PT LONG TERM GOAL #4   Title Complete assessment for power w/c vs. tilt-in-space w/c to  preserve skin integrity and maximize pt independence with mobility.  (10/14/15)   Status New               Plan - 09/23/15 1502    Clinical Impression Statement Session continued to focus on increasing pt safety driving power w/c. Pt required min to mod A to drive w/c linearly, but continues to require total A for 180 and 360-degree turning. Contacted w/c rep in attempt to modify to larger joystick and potentially to move joystick to midline.    Rehab Potential Fair   Clinical Impairments Affecting Rehab Potential chronicity of R hemiplegia; R inattention; global aphasia; and chronic knee pain   PT Frequency Other (comment)  8 sessions   PT Duration 6 weeks   PT Treatment/Interventions ADLs/Self Care Home Management;Neuromuscular re-education;Functional mobility training;Patient/family education;Therapeutic activities;Balance training;Orthotic Fit/Training;Therapeutic exercise;Wheelchair mobility training   PT Next Visit Plan Check STG's. Continue to practice use of power w/c   Consulted and Agree with Plan of Care Patient;Family member/caregiver   Family Member  Consulted daughter, [name]      Patient will benefit from skilled therapeutic intervention in order to improve the following deficits and impairments:  Decreased cognition, Decreased coordination, Decreased mobility, Impaired tone, Impaired UE functional use, Decreased knowledge of use of DME, Impaired sensation  Visit Diagnosis: Hemiplegia and hemiparesis following cerebral infarction affecting right dominant side ([place])  Other abnormalities of gait and mobility     Problem List Patient Active Problem List   Diagnosis Date Noted  . Dermatitis 09/13/2015  . Essential thrombocytosis ([place]) 07/31/2015  . SVC (superior vena cava  obstruction), chronic 07/20/2015  . Dysphagia 07/20/2015  . Elevated liver enzymes 07/20/2015  . Constipation 07/20/2015  . Thrombophlebitis of breast, right 06/18/2015  . Tracheal deviation 06/06/2015  . Sensorineural hearing loss, bilateral, moderate-moderately severe 04/30/2015  . Abdominal wall lump 03/09/2015  . Frequent UTI 02/06/2015  . Primary osteoarthritis involving multiple joints 07/26/2014  . Pernicious anemia 07/26/2014  . Hearing loss 07/26/2014  . Encounter for therapeutic drug monitoring 07/17/2014  . Spastic hemiparesis affecting dominant side ([place]), right 05/31/2014  . DVT of lower extremity, bilateral ([place]) 03/14/2014  . Global aphasia 03/14/2014  . 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 Aphasia due to stroke 03/13/2014  . Cerebral infarction due to embolism of left middle cerebral artery ([place])   . Stroke, embolic ([place]) [id number]  . History of pulmonary embolism 03/08/2014  . Back pain 08/29/2013  . Primary localized osteoarthrosis, lower leg 03/06/2013  . IBS (irritable bowel syndrome)   . Multinodular goiter 01/13/2011  . Hyperthyroidism 11/11/2009  . Insomnia 07/03/2009  . POSTTRAUMATIC STRESS DISORDER 08/22/2008  . OBESITY 04/21/2008  . Osteoarthritis 04/21/2008  . MIGRAINE HEADACHE 04/20/2008  . Essential hypertension  04/20/2008    Billie Ruddy, PT, DPT [facility] [address] [place], [place], [address] Phone: [phone]   Fax:  [phone] 09/23/15, 3:19 PM  Name: Sandy Barrett MRN: [id number] Date of Birth: 10-Nov-1932

## 2015-09-24 ENCOUNTER — Other Ambulatory Visit: Payer: Self-pay | Admitting: Hematology and Oncology

## 2015-09-24 ENCOUNTER — Ambulatory Visit (HOSPITAL_BASED_OUTPATIENT_CLINIC_OR_DEPARTMENT_OTHER): Payer: Medicare Other | Admitting: Physical Medicine & Rehabilitation

## 2015-09-24 ENCOUNTER — Encounter: Payer: Self-pay | Admitting: Physical Medicine & Rehabilitation

## 2015-09-24 ENCOUNTER — Encounter: Payer: Medicare Other | Attending: Physical Medicine & Rehabilitation

## 2015-09-24 VITALS — BP 109/76 | HR 79

## 2015-09-24 DIAGNOSIS — I69320 Aphasia following cerebral infarction: Secondary | ICD-10-CM | POA: Diagnosis not present

## 2015-09-24 DIAGNOSIS — G8190 Hemiplegia, unspecified affecting unspecified side: Secondary | ICD-10-CM | POA: Insufficient documentation

## 2015-09-24 DIAGNOSIS — M7541 Impingement syndrome of right shoulder: Secondary | ICD-10-CM | POA: Insufficient documentation

## 2015-09-24 DIAGNOSIS — M6289 Other specified disorders of muscle: Secondary | ICD-10-CM | POA: Diagnosis not present

## 2015-09-24 DIAGNOSIS — G8110 Spastic hemiplegia affecting unspecified side: Secondary | ICD-10-CM

## 2015-09-24 DIAGNOSIS — G8111 Spastic hemiplegia affecting right dominant side: Secondary | ICD-10-CM | POA: Diagnosis not present

## 2015-09-24 NOTE — Patient Instructions (Signed)

## 2015-09-24 NOTE — Progress Notes (Signed)
Botox Injection for spasticity using needle EMG guidance  Dilution: 50 Units/ml Indication: Severe spasticity which interferes with ADL,mobility and/or  hygiene and is unresponsive to medication management and other conservative care Informed consent was obtained after describing risks and benefits of the procedure with the patient. This includes bleeding, bruising, infection, excessive weakness, or medication side effects. A REMS form is on file and signed. Needle: 25g 2" needle electrode Number of units per muscle FCR50 FDS50 FDP50 FPL50   PT50 FCU50 All injections were done after obtaining appropriate EMG activity and after negative drawback for blood. The patient tolerated the procedure well. Post procedure instructions were given. A followup appointment was made.

## 2015-09-25 ENCOUNTER — Ambulatory Visit (INDEPENDENT_AMBULATORY_CARE_PROVIDER_SITE_OTHER): Payer: Medicare Other | Admitting: Podiatry

## 2015-09-25 ENCOUNTER — Encounter: Payer: Self-pay | Admitting: Podiatry

## 2015-09-25 VITALS — BP 91/71 | HR 72 | Resp 18

## 2015-09-25 DIAGNOSIS — M79674 Pain in right toe(s): Secondary | ICD-10-CM

## 2015-09-25 DIAGNOSIS — B351 Tinea unguium: Secondary | ICD-10-CM

## 2015-09-25 DIAGNOSIS — M79675 Pain in left toe(s): Secondary | ICD-10-CM | POA: Diagnosis not present

## 2015-09-26 ENCOUNTER — Encounter: Payer: Self-pay | Admitting: Hematology and Oncology

## 2015-09-26 ENCOUNTER — Ambulatory Visit (HOSPITAL_BASED_OUTPATIENT_CLINIC_OR_DEPARTMENT_OTHER): Payer: Medicare Other | Admitting: Hematology and Oncology

## 2015-09-26 ENCOUNTER — Telehealth: Payer: Self-pay | Admitting: Hematology and Oncology

## 2015-09-26 ENCOUNTER — Other Ambulatory Visit (HOSPITAL_BASED_OUTPATIENT_CLINIC_OR_DEPARTMENT_OTHER): Payer: Medicare Other

## 2015-09-26 ENCOUNTER — Ambulatory Visit (HOSPITAL_BASED_OUTPATIENT_CLINIC_OR_DEPARTMENT_OTHER): Payer: Medicare Other

## 2015-09-26 ENCOUNTER — Ambulatory Visit: Payer: Self-pay | Admitting: General Practice

## 2015-09-26 VITALS — BP 90/63 | HR 77 | Temp 97.7°F | Resp 18 | Ht 60.0 in | Wt 173.1 lb

## 2015-09-26 DIAGNOSIS — Z86711 Personal history of pulmonary embolism: Secondary | ICD-10-CM

## 2015-09-26 DIAGNOSIS — L819 Disorder of pigmentation, unspecified: Secondary | ICD-10-CM

## 2015-09-26 DIAGNOSIS — I639 Cerebral infarction, unspecified: Secondary | ICD-10-CM

## 2015-09-26 DIAGNOSIS — D473 Essential (hemorrhagic) thrombocythemia: Secondary | ICD-10-CM

## 2015-09-26 LAB — CBC WITH DIFFERENTIAL/PLATELET
BASO%: 1.3 % (ref 0.0–2.0)
Basophils Absolute: 0.1 10*3/uL (ref 0.0–0.1)
EOS%: 2.4 % (ref 0.0–7.0)
Eosinophils Absolute: 0.1 10*3/uL (ref 0.0–0.5)
HCT: 41.1 % (ref 34.8–46.6)
HGB: 13.7 g/dL (ref 11.6–15.9)
LYMPH%: 35.7 % (ref 14.0–49.7)
MCH: 34.1 pg — ABNORMAL HIGH (ref 25.1–34.0)
MCHC: 33.3 g/dL (ref 31.5–36.0)
MCV: 102.2 fL — ABNORMAL HIGH (ref 79.5–101.0)
MONO#: 0.3 10*3/uL (ref 0.1–0.9)
MONO%: 7.7 % (ref 0.0–14.0)
NEUT#: 2 10*3/uL (ref 1.5–6.5)
NEUT%: 52.9 % (ref 38.4–76.8)
Platelets: 250 10*3/uL (ref 145–400)
RBC: 4.02 10*6/uL (ref 3.70–5.45)
RDW: 23.1 % — ABNORMAL HIGH (ref 11.2–14.5)
WBC: 3.8 10*3/uL — ABNORMAL LOW (ref 3.9–10.3)
lymph#: 1.3 10*3/uL (ref 0.9–3.3)
nRBC: 0 % (ref 0–0)

## 2015-09-26 LAB — PROTIME-INR
INR: 2.2 (ref 2.00–3.50)
Protime: 26.4 Seconds — ABNORMAL HIGH (ref 10.6–13.4)

## 2015-09-26 NOTE — Assessment & Plan Note (Signed)
The dermatitis has resolved. I am not sure the cause of skin discoloration. Recommend dermatologist referral and she agreed

## 2015-09-26 NOTE — Telephone Encounter (Signed)
Patient/family sent back to lab and given avs report and appointments for October.   [facility]  [place], [place] [address] [phone]  Dr. Sydnee Levans 12/05/15 @ 2:50 pm arrive 2:30 pm Spoke with [facility]   Patient/family aware

## 2015-09-26 NOTE — Assessment & Plan Note (Signed)
The patient is requesting INR monitoring today to save her a trip to her PCP office. I will order INR per patient request but with the understanding that her primary care doctor will be adjusting her warfarin dose.

## 2015-09-26 NOTE — Progress Notes (Signed)
Patient ID: Sandy Barrett, female   DOB: September 12, 1932, 82 y.o.   MRN: [id number]    Subjective: This patient presents today with daughter present to treatment room complaining of thickened and uncomfortable hallux toenails left greater than right over an undetermined amount of time. The hallux nails primary left to become more uncomfortable and thickened when patient has direct shoe pressure over the toe. Patient has history of stroke w him hemi paresis right side and is in a seated in a wheelchair and unable to transfer to treatment chair   Objective: Pleasant orientated 3 patient  Vascular: DP pulses 2/4 bilaterally PT pulses 1/4 bilaterally Capillary reflex immediate bilaterally  Dermatological: The hallux nails left greater right are deformed, discolored and hypertrophic The remaining toenails have texture and color changes with minimal deformity and palpable tenderness  Neurological: Ankle reflexes nonreactive right weekly reactive left Sensation to 10 g monofilament wire 0/5 right and 4/5 left  Musculoskeletal: Dorsi flexion and plantar flexion right 0/5 Dorsi flexion and plantar flexion left 5/5  Assessment: Stroke related right lower extremity weakness Symptomatic onychomycoses 6-10 with maximum symptoms and the hallux toenails   Plan:  Debridement of toenails 6-10 mechanically and electrically without any bleeding   Reappoint 3 months

## 2015-09-26 NOTE — Assessment & Plan Note (Signed)
She tolerated hydroxyurea well. Her recent skin rashes not related to hydroxyurea. Her platelet count has normalized. However, I am concerned about leukopenia. I recommend reducing the dose of Hydrea to 500 mg daily except on Saturdays and Sundays she takes 1000 mg. I will see her back in one month for further review and repeat blood work

## 2015-09-26 NOTE — Progress Notes (Signed)
[facility] OFFICE PROGRESS NOTE  Patient Care Team: [name], MD as PCP - General (Internal Medicine) Eunice Blase, MD (Inactive) as Consulting Physician (Family Medicine) Renato Shin, MD as Consulting Physician (Endocrinology) Netta Cedars, MD as Consulting Physician (Orthopedic Surgery) Lyndal Pulley, DO (Sports Medicine) Garvin Fila, MD (Neurology)  SUMMARY OF ONCOLOGIC HISTORY:   Essential thrombocytosis ([place])   08/07/2013 Miscellaneous    The patient is noted to have elevated platelet count      03/08/2014 Imaging    Positive for acute PE with CT evidence of right heart strain (RV/LV Ratio = 0.9) consistent with at least submassive (intermediate risk)PE.       03/09/2014 Imaging    US venous Doppler showed deep vein thrombosis noted in the right distal common femoral vein, femoral vein, and popliteal vein. DVT noted in the left femoral and popliteal veins      03/09/2014 Imaging    Patchy areas of acute left MCA territory infarction. 2. Several punctate foci of acute right MCA territory infarction. Possible trace subarachnoid hemorrhage in the high right frontal lobe. 3. Occluded left ICA and left MCA      03/20/2015 Imaging    Compared to MRI on 03/09/14, there has been expected evolutional change of left MCA infarction. In addition, there may be a few areas of acute-subacute infarcts in the left basal ganglia vs artifact.        07/31/2015 Pathology Results    Peripheral blood is positive for JAK2 mutation      07/31/2015 -  Chemotherapy    She is started on 500 mg daily Hydrea      08/15/2015 Miscellaneous    The dose of hydroxyurea is increased to 1000 mg daily       INTERVAL HISTORY: Please see below for problem oriented charting. She feels well. The dermatitis has resolved but she is left with some minor skin discoloration. She denies side effects of treatment. The patient denies any recent signs or symptoms of bleeding such as spontaneous  epistaxis, hematuria or hematochezia.   REVIEW OF SYSTEMS:   Constitutional: Denies fevers, chills or abnormal weight loss Eyes: Denies blurriness of vision Ears, nose, mouth, throat, and face: Denies mucositis or sore throat Respiratory: Denies cough, dyspnea or wheezes Cardiovascular: Denies palpitation, chest discomfort or lower extremity swelling Gastrointestinal:  Denies nausea, heartburn or change in bowel habits Lymphatics: Denies new lymphadenopathy or easy bruising Neurological:Denies numbness, tingling or new weaknesses Behavioral/Psych: Mood is stable, no new changes  All other systems were reviewed with the patient and are negative.  I have reviewed the past medical history, past surgical history, social history and family history with the patient and they are unchanged from previous note.  ALLERGIES:  has No Known Allergies.  MEDICATIONS:  Current Outpatient Prescriptions  Medication Sig Dispense Refill  . acetaminophen (TYLENOL) 325 MG tablet Take 1-2 tablets (325-650 mg total) by mouth every 4 (four) hours as needed for mild pain.    [name] albuterol (PROVENTIL HFA) 108 (90 Base) MCG/ACT inhaler Inhale 2 puffs into the lungs every 6 (six) hours as needed for wheezing or shortness of breath. 1 Inhaler 1  . diclofenac sodium (VOLTAREN) 1 % GEL APPLY (2GMS) TOPICALLY THREE TIMES DAILY. (Patient taking differently: APPLY (2GMS) TOPICALLY THREE TIMES DAILY AS NEEDED FOR PAIN) 300 g 3  . gabapentin (NEURONTIN) 100 MG capsule Take 1 capsule (100 mg total) by mouth 3 (three) times daily. (Patient taking differently: Take 100  mg by mouth 2 (two) times daily. ) 90 capsule 5  . hydrocortisone 1 % ointment APPLY 1 APPLICATION TOPICALLY 2 (TWO) TIMES DAILY. 28.35 g 0  . hydroxyurea (HYDREA) 500 MG capsule Take 2 capsules (1,000 mg total) by mouth daily. May take with food to minimize GI side effects. 60 capsule 9  . methimazole (TAPAZOLE) 5 MG tablet Take 1 tablet (5 mg total) by mouth 2  (two) times a week. 12 tablet 2  . pantoprazole sodium (PROTONIX) 40 mg/20 mL PACK Take 20 mLs (40 mg total) by mouth daily. 30 each 5  . polyethylene glycol (MIRALAX) packet Take 17 g by mouth 2 (two) times daily. (Patient taking differently: Take 17 g by mouth 2 (two) times daily as needed. ) 14 each 0  . Probiotic Product (ALIGN) 4 MG CAPS Take 1 capsule by mouth daily.     . propranolol (INDERAL) 10 MG tablet Take 1 tablet (10 mg total) by mouth 2 (two) times daily. (Patient taking differently: Take 10 mg by mouth daily. ) 60 tablet 3  . terconazole (TERAZOL 7) 0.4 % vaginal cream Place 1 applicator vaginally at bedtime. 90 g 3  . traMADol (ULTRAM) 50 MG tablet Take 1-2 tablets by mouth 3 times daily as needed 180 tablet 1  . traZODone (DESYREL) 50 MG tablet Take 1 tablet (50 mg total) by mouth at bedtime as needed. for sleep 30 tablet 5  . warfarin (COUMADIN) 4 MG tablet Take as directed by anticoagulation clinic. (Patient taking differently: Take as directed by anticoagulation clinic. Patient is taking 5 mg daily as 08/22/15. Verify dosage with patient upon admitting, changes weekly based on INR.) 40 tablet 3   Current Facility-Administered Medications  Medication Dose Route Frequency Provider Last Rate Last Dose  . albuterol (PROVENTIL) (2.5 MG/3ML) 0.083% nebulizer solution 2.5 mg  2.5 mg Nebulization Once Burnard Hawthorne, FNP        PHYSICAL EXAMINATION: ECOG PERFORMANCE STATUS: 2 - Symptomatic, <50% confined to bed  Vitals:   09/26/15 1448  BP: 90/63  Pulse: 77  Resp: 18  Temp: 97.7 F (36.5 C)   Filed Weights   09/26/15 1448  Weight: 173 lb 1.6 oz (78.5 kg)    GENERAL:alert, no distress and comfortable SKIN: Previous dermatitis had resolved. She has mild skin discoloration EYES: normal, Conjunctiva are pink and non-injected, sclera clear Musculoskeletal:no cyanosis of digits and no clubbing  NEURO: alert & oriented x 3 with dysarthria, persistent neurological deficit  unchanged  LABORATORY DATA:  I have reviewed the data as listed    Component Value Date/Time   NA 139 07/19/2015 1620   K 3.7 07/19/2015 1620   CL 104 07/19/2015 1620   CO2 28 07/19/2015 1620   GLUCOSE 78 07/19/2015 1620   BUN 11 07/19/2015 1620   CREATININE 0.39 (L) 07/19/2015 1620   CALCIUM 9.5 07/19/2015 1620   PROT 6.8 07/19/2015 1620   ALBUMIN 3.6 07/19/2015 1620   AST 16 07/19/2015 1620   ALT 49 (H) 07/19/2015 1620   ALKPHOS 125 (H) 07/19/2015 1620   BILITOT 1.5 (H) 07/19/2015 1620   GFRNONAA >60 06/27/2015 0454   GFRAA >60 06/27/2015 0454    No results found for: SPEP, UPEP  Lab Results  Component Value Date   WBC 3.8 (L) 09/26/2015   NEUTROABS 2.0 09/26/2015   HGB 13.7 09/26/2015   HCT 41.1 09/26/2015   MCV 102.2 (H) 09/26/2015   PLT 250 09/26/2015      Chemistry  Component Value Date/Time   NA 139 07/19/2015 1620   K 3.7 07/19/2015 1620   CL 104 07/19/2015 1620   CO2 28 07/19/2015 1620   BUN 11 07/19/2015 1620   CREATININE 0.39 (L) 07/19/2015 1620      Component Value Date/Time   CALCIUM 9.5 07/19/2015 1620   ALKPHOS 125 (H) 07/19/2015 1620   AST 16 07/19/2015 1620   ALT 49 (H) 07/19/2015 1620   BILITOT 1.5 (H) 07/19/2015 1620       ASSESSMENT & PLAN:  Essential thrombocytosis ([place]) She tolerated hydroxyurea well. Her recent skin rashes not related to hydroxyurea. Her platelet count has normalized. However, I am concerned about leukopenia. I recommend reducing the dose of Hydrea to 500 mg daily except on Saturdays and Sundays she takes 1000 mg. I will see her back in one month for further review and repeat blood work  History of pulmonary embolism The patient is requesting INR monitoring today to save her a trip to her PCP office. I will order INR per patient request but with the understanding that her primary care doctor will be adjusting her warfarin dose.  Discoloration of skin The dermatitis has resolved. I am not sure the cause of  skin discoloration. Recommend dermatologist referral and she agreed   Orders Placed This Encounter  Procedures  . Protime-INR    Standing Status:   Standing    Number of Occurrences:   9    Standing Expiration Date:   09/25/2016  . Ambulatory referral to Dermatology    Referral Priority:   Routine    Referral Type:   Consultation    Referral Reason:   Specialty Services Required    Referred to Provider:   Sydnee Levans, MD    Requested Specialty:   Dermatology    Number of Visits Requested:   1   All questions were answered. The patient knows to call the clinic with any problems, questions or concerns. No barriers to learning was detected. I spent 15 minutes counseling the patient face to face. The total time spent in the appointment was 20 minutes and more than 50% was on counseling and review of test results     [facility], [place], MD 09/26/2015 3:18 PM

## 2015-09-26 NOTE — Progress Notes (Signed)
Agree with management.  Arlana Canizales J Kimimila Tauzin, MD  

## 2015-09-27 ENCOUNTER — Ambulatory Visit: Payer: Medicare Other

## 2015-09-30 ENCOUNTER — Ambulatory Visit: Payer: Medicare Other | Admitting: Physical Therapy

## 2015-09-30 DIAGNOSIS — I69351 Hemiplegia and hemiparesis following cerebral infarction affecting right dominant side: Secondary | ICD-10-CM | POA: Diagnosis not present

## 2015-09-30 DIAGNOSIS — R2689 Other abnormalities of gait and mobility: Secondary | ICD-10-CM

## 2015-09-30 NOTE — Therapy (Signed)
[facility] [address] [place], [place], [address] Phone: [phone]   Fax:  [phone]  Physical Therapy Treatment  Patient Details  Name: Sandy Barrett MRN: [id number] Date of Birth: 30-May-1932 Referring Provider: [name], MD  Encounter Date: 09/30/2015      PT End of Session - 09/30/15 1809    Visit Number 9   Number of Visits 14   Date for PT Re-Evaluation 10/17/15   Authorization Type Medicare Traditional primary; BCBS secondary   Authorization Time Period G Codes required   PT Start Time 1402   PT Stop Time 1444   PT Time Calculation (min) 42 min   Activity Tolerance Patient tolerated treatment well   Behavior During Therapy [facility] for tasks assessed/performed      Past Medical History:  Diagnosis Date  . ARTHRITIS   . Arthritis   . Diverticulosis   . HYPERTENSION   . HYPERTHYROIDISM   . Hyperthyroidism    s/p I-131 ablation 03/2011 of multinod goiter  . INSOMNIA   . MIGRAINE HEADACHE   . OBESITY   . Posttraumatic stress disorder   . Pulmonary embolism ([place]) 03/2014   with DVT  . Stroke [facility]) 03/2014   dysarthria    Past Surgical History:  Procedure Laterality Date  . ABDOMINAL HYSTERECTOMY  1976  . BREAST SURGERY     biopsy  . RADIOLOGY WITH ANESTHESIA Left 03/08/2014   Procedure: RADIOLOGY WITH ANESTHESIA;  Surgeon: Rob Hickman, MD;  Location: [facility];  Service: Radiology;  Laterality: Left;  . TONSILLECTOMY    . TOTAL HIP ARTHROPLASTY  1998    right    There were no vitals filed for this visit.      Subjective Assessment - 09/30/15 1755    Subjective Daughter not present for session due to needing to run errand during PT. Unable to obtain sujective/history due to global aphasia.   Pertinent History PMH significant for: L MCA CVA (03/2014) with severe R spastic hemiplegia, global aphasia, and apraxia; HTN, HLD hyperthyroidism, OA, PE with DVT (03/2014), R THA.   Currently  in Pain? No/denies  No overt signs of pain.                         [facility] Adult PT Treatment/Exercise - 09/30/15 0001      Transfers   Transfers Squat Pivot Transfers   Squat Pivot Transfers 1: +2 Total assist   Squat Pivot Transfers: Patient Percentage 10%   Squat Pivot Transfer Details (indicate cue type and reason) from personal w/c <> power w/c     Chief Technology Officer Yes   Wheelchair Assistance 4: Min assist   Wheelchair Assistance Details Manual facilitation for placement;Verbal cues for technique;Verbal cues for precautions/safety;Verbal cues for safe use of DME/AE   Wheelchair Propulsion Left upper extremity   Wheelchair Parts Management Needs assistance   Distance 625   Comments Switched power w/c joystick to joystick with larger circumference, which appeared to greatly benefit pt. Pt  performed power w/c mobility x625' total using LUE with close supervision to min A with cueing for attention to R visual field. Pt performed 180-degree turns x7 total with 50% cueing for technique. Cueing required for attention to R visual field during w/c propulsion down narrow hallway.                  PT Short Term Goals - 09/02/15 1435  PT SHORT TERM GOAL #1   Title Pt will safely drive power w/c for [id number]' over level, indoor surfaces with mod A, 50% cueing for R visual attention.  (Target: 09/23/15)   Status New     PT SHORT TERM GOAL #2   Title While using power chair, pt will consistently perform 180-degree turns with mod A, 50% cueing to indicate increased independence with w/c management.    (Target: 09/23/15)   Status New     PT SHORT TERM GOAL #3   Title Pt will perform 360-degree turns with mod A, 50% cueing to indicate increased independence with w/c management.    (Target: 09/23/15)   Status New           PT Long Term Goals - 09/02/15 1439      PT LONG TERM GOAL #1   Title Pt will safely drive power w/c for [id number]' over  level, indoor surfaces with min guard, 25% cueing for R visual attention. (10/14/15)   Status New     PT LONG TERM GOAL #2   Title While using power chair, pt will consistently perform 180-degree turns with min guard, 25% cueing to indicate increased independence with w/c management.   (10/14/15)   Status New     PT LONG TERM GOAL #3   Title Pt will perform 360-degree turns with min guard, 25% cueing to indicate increased independence with w/c management.   (10/14/15)   Status New     PT LONG TERM GOAL #4   Title Complete assessment for power w/c vs. tilt-in-space w/c to  preserve skin integrity and maximize pt independence with mobility.  (10/14/15)   Status New               Plan - 09/30/15 1811    Clinical Impression Statement Session focused on continued w/c mobility training. Pt appeared to greatly benefit from modification of power w/c joystick, as pt required only up to min A for power w/c mobility over level, indoor surfaces.    Rehab Potential Fair   Clinical Impairments Affecting Rehab Potential chronicity of R hemiplegia; R inattention; global aphasia; and chronic knee pain   PT Frequency Other (comment)  8 sessions   PT Duration 6 weeks   PT Treatment/Interventions ADLs/Self Care Home Management;Neuromuscular re-education;Functional mobility training;Patient/family education;Therapeutic activities;Balance training;Orthotic Fit/Training;Therapeutic exercise;Wheelchair mobility training   PT Next Visit [place] and [place].  Check STG's.  W/c mobility over paved, outdoor surfaces.   Consulted and Agree with Plan of Care Patient   Family Member Consulted --      Patient will benefit from skilled therapeutic intervention in order to improve the following deficits and impairments:  Decreased cognition, Decreased coordination, Decreased mobility, Impaired tone, Impaired UE functional use, Decreased knowledge of use of DME, Impaired sensation  Visit Diagnosis: Hemiplegia and  hemiparesis following cerebral infarction affecting right dominant side (HCC)  Other abnormalities of gait and mobility     Problem List Patient Active Problem List   Diagnosis Date Noted  . Discoloration of skin 09/26/2015  . Dermatitis 09/13/2015  . Essential thrombocytosis ([place]) 07/31/2015  . SVC (superior vena cava obstruction), chronic 07/20/2015  . Dysphagia 07/20/2015  . Elevated liver enzymes 07/20/2015  . Constipation 07/20/2015  . Thrombophlebitis of breast, right 06/18/2015  . Tracheal deviation 06/06/2015  . Sensorineural hearing loss, bilateral, moderate-moderately severe 04/30/2015  . Abdominal wall lump 03/09/2015  . Frequent UTI 02/06/2015  . Primary osteoarthritis involving multiple joints 07/26/2014  . Pernicious  anemia 07/26/2014  . Hearing loss 07/26/2014  . Encounter for therapeutic drug monitoring 07/17/2014  . Spastic hemiparesis affecting dominant side ([place]), right 05/31/2014  . DVT of lower extremity, bilateral ([place]) 03/14/2014  . Global aphasia 03/14/2014  . 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 Aphasia due to stroke 03/13/2014  . Cerebral infarction due to embolism of left middle cerebral artery ([place])   . Stroke, embolic ([place]) [id number]  . History of pulmonary embolism 03/08/2014  . Back pain 08/29/2013  . Primary localized osteoarthrosis, lower leg 03/06/2013  . IBS (irritable bowel syndrome)   . Multinodular goiter 01/13/2011  . Hyperthyroidism 11/11/2009  . Insomnia 07/03/2009  . POSTTRAUMATIC STRESS DISORDER 08/22/2008  . OBESITY 04/21/2008  . Osteoarthritis 04/21/2008  . MIGRAINE HEADACHE 04/20/2008  . Essential hypertension 04/20/2008    Billie Ruddy, PT, DPT [facility] [address] [name], [place], [address] Phone: [phone]   Fax:  [phone] 09/30/15, 6:14 PM  Name: Milcah Pluff MRN: [id number] Date of Birth: Feb 23, 1932

## 2015-10-02 ENCOUNTER — Ambulatory Visit: Payer: No Typology Code available for payment source | Admitting: Physical Therapy

## 2015-10-04 ENCOUNTER — Ambulatory Visit: Payer: Medicare Other | Admitting: Physical Therapy

## 2015-10-04 DIAGNOSIS — R2689 Other abnormalities of gait and mobility: Secondary | ICD-10-CM

## 2015-10-04 DIAGNOSIS — I69351 Hemiplegia and hemiparesis following cerebral infarction affecting right dominant side: Secondary | ICD-10-CM

## 2015-10-04 NOTE — Therapy (Signed)
[facility] [address] [place], [place], [address] Phone: [phone]   Fax:  [phone]  Physical Therapy Treatment  Patient Details  Name: Sandy Barrett MRN: [id number] Date of Birth: 1932/04/28 Referring Provider: [name], MD  Encounter Date: 10/04/2015      PT End of Session - 10/04/15 1431    Visit Number 10   Number of Visits 14   Date for PT Re-Evaluation 10/17/15   Authorization Type Medicare Traditional primary; BCBS secondary   Authorization Time Period G Codes required   PT Start Time 1320   PT Stop Time 1359   PT Time Calculation (min) 39 min   Activity Tolerance Patient tolerated treatment well   Behavior During Therapy [facility] for tasks assessed/performed      Past Medical History:  Diagnosis Date  . ARTHRITIS   . Arthritis   . Diverticulosis   . HYPERTENSION   . HYPERTHYROIDISM   . Hyperthyroidism    s/p I-131 ablation 03/2011 of multinod goiter  . INSOMNIA   . MIGRAINE HEADACHE   . OBESITY   . Posttraumatic stress disorder   . Pulmonary embolism ([place]) 03/2014   with DVT  . Stroke [facility]) 03/2014   dysarthria    Past Surgical History:  Procedure Laterality Date  . ABDOMINAL HYSTERECTOMY  1976  . BREAST SURGERY     biopsy  . RADIOLOGY WITH ANESTHESIA Left 03/08/2014   Procedure: RADIOLOGY WITH ANESTHESIA;  Surgeon: Rob Hickman, MD;  Location: [facility];  Service: Radiology;  Laterality: Left;  . TONSILLECTOMY    . TOTAL HIP ARTHROPLASTY  1998    right    There were no vitals filed for this visit.      Subjective Assessment - 10/04/15 1431    Subjective Pt arrived accompanied by daughter. No significant changes to report.    Patient is accompained by: Family member  daughter, Sandy Barrett   Pertinent History PMH significant for: L MCA CVA (03/2014) with severe R spastic hemiplegia, global aphasia, and apraxia; HTN, HLD hyperthyroidism, OA, PE with DVT (03/2014), R THA.   Patient  Stated Goals Per daughter, "Strengthen her legs in a more active way - maybe use the machines (NuStep) and try some standing. I'm not interrested in working on transfers, because I can't physically help her with that at home."   Currently in Pain? No/denies                         [facility] Adult PT Treatment/Exercise - 10/04/15 0001      Transfers   Transfers Squat Pivot Transfers   Squat Pivot Transfers 1: +2 Total assist   Squat Pivot Transfers: Patient Percentage 10%   Squat Pivot Transfer Details (indicate cue type and reason) from personal w/c <> power w/c     Ambulation/Gait   Ambulation/Gait No  Pt non-ambulatory     Wheelchair Mobility   Wheelchair Mobility Yes   Wheelchair Assistance 5: Supervision;4: Min guard;4: Min assist   Wheelchair Assistance Details Verbal cues for technique;Verbal cues for precautions/safety   Wheelchair Propulsion Left upper extremity   Wheelchair Parts Management Needs assistance   Distance 650   Comments Pt performed power w/c mobility with LUE x450' over level, indoor surfaces and x200' over unlevel, paved surfaces with multiple 180-degree turns with min A, 25% cueing (demo, HOH assist). Pt required mod to max A and 50-75% cueing for 360-degree turns and when negotiating doors (due  to pt tendency to remain close to L side of door frame).                  PT Short Term Goals - 10/04/15 1433      PT SHORT TERM GOAL #1   Title Pt will safely drive power w/c for 177' over level, indoor surfaces with mod A, 50% cueing for R visual attention.  (Target: 09/23/15)   Baseline Met 9/29.   Status Achieved     PT SHORT TERM GOAL #2   Title While using power chair, pt will consistently perform 180-degree turns with mod A, 50% cueing to indicate increased independence with w/c management.    (Target: 09/23/15)   Baseline Met 9/29   Status Achieved     PT SHORT TERM GOAL #3   Title Pt will perform 360-degree turns with mod A, 50%  cueing to indicate increased independence with w/c management.    (Target: 09/23/15)   Baseline 9/29: partially met, as pt inconsistently performs 360-degree turns on demand with mod A.   Status Partially Met           PT Long Term Goals - 09/02/15 1439      PT LONG TERM GOAL #1   Title Pt will safely drive power w/c for 939' over level, indoor surfaces with min guard, 25% cueing for R visual attention. (10/14/15)   Status New     PT LONG TERM GOAL #2   Title While using power chair, pt will consistently perform 180-degree turns with min guard, 25% cueing to indicate increased independence with w/c management.   (10/14/15)   Status New     PT LONG TERM GOAL #3   Title Pt will perform 360-degree turns with min guard, 25% cueing to indicate increased independence with w/c management.   (10/14/15)   Status New     PT LONG TERM GOAL #4   Title Complete assessment for power w/c vs. tilt-in-space w/c to  preserve skin integrity and maximize pt independence with mobility.  (10/14/15)   Status New               Plan - 10/04/15 1441    Clinical Impression Statement Pt met 2 of 3 STG's, suggesting significant improvement in independence in w/c propulsion. Pt is now min A for linear w/c propulsion and for 180-degree turns. Continues to require mod to max A to negotiate door frames and for 360-degree turns.    Rehab Potential Fair   Clinical Impairments Affecting Rehab Potential chronicity of R hemiplegia; R inattention; global aphasia; and chronic knee pain   PT Frequency Other (comment)  8 sessions   PT Duration 6 weeks   PT Treatment/Interventions ADLs/Self Care Home Management;Neuromuscular re-education;Functional mobility training;Patient/family education;Therapeutic activities;Balance training;Orthotic Fit/Training;Therapeutic exercise;Wheelchair mobility training   PT Next Visit Plan *NEW GCODES (to reflect independence with power w/c). Plan to have wheelchair rep, Sandy Barrett, present to  reassess if pt appropriate candidate for power w/c at this time.   Consulted and Agree with Plan of Care Patient;Family member/caregiver   Family Member Consulted daughter, Sandy Barrett      Patient will benefit from skilled therapeutic intervention in order to improve the following deficits and impairments:  Decreased cognition, Decreased coordination, Decreased mobility, Impaired tone, Impaired UE functional use, Decreased knowledge of use of DME, Impaired sensation  Visit Diagnosis: Hemiplegia and hemiparesis following cerebral infarction affecting right dominant side (HCC)  Other abnormalities of gait and mobility  G-Codes - 10/04/15 1432    Functional Assessment Tool Used Clinical Judgement   Functional Limitation Changing and maintaining body position   Changing and Maintaining Body Position Goal Status [id number]) At least 60 percent but less than 80 percent impaired, limited or restricted   Changing and Maintaining Body Position Discharge Status [phone]) At least 80 percent but less than 100 percent impaired, limited or restricted     Physical Therapy Progress Note  Dates of Reporting Period: 07/23/15 to 10/04/15  Objective Reports of Subjective Statement: Subjective portion limited by global aphasia; however, pt's daughter, Sandy Barrett, expresses interest in pursuing custom w/c, as pt's ability to utilize power w/c has improved markedly.   Objective Measurements: See STG's above.  Goal Update: See above.  Plan: Continue per POC.   Reason Skilled Services are Required: To pursue a power w/c to maximize pt independence with functional mobility, to decrease risk of further skin breakdown (as power w/c will enable pt to perform pressure relief consistently).    Problem List Patient Active Problem List   Diagnosis Date Noted  . Discoloration of skin 09/26/2015  . Dermatitis 09/13/2015  . Essential thrombocytosis ([place]) 07/31/2015  . SVC (superior vena cava obstruction), chronic  07/20/2015  . Dysphagia 07/20/2015  . Elevated liver enzymes 07/20/2015  . Constipation 07/20/2015  . Thrombophlebitis of breast, right 06/18/2015  . Tracheal deviation 06/06/2015  . Sensorineural hearing loss, bilateral, moderate-moderately severe 04/30/2015  . Abdominal wall lump 03/09/2015  . Frequent UTI 02/06/2015  . Primary osteoarthritis involving multiple joints 07/26/2014  . Pernicious anemia 07/26/2014  . Hearing loss 07/26/2014  . Encounter for therapeutic drug monitoring 07/17/2014  . Spastic hemiparesis affecting dominant side ([place]), right 05/31/2014  . DVT of lower extremity, bilateral ([place]) 03/14/2014  . Global aphasia 03/14/2014  . 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 Aphasia due to stroke 03/13/2014  . Cerebral infarction due to embolism of left middle cerebral artery ([place])   . Stroke, embolic ([place]) 02/54/2706  . History of pulmonary embolism 03/08/2014  . Back pain 08/29/2013  . Primary localized osteoarthrosis, lower leg 03/06/2013  . IBS (irritable bowel syndrome)   . Multinodular goiter 01/13/2011  . Hyperthyroidism 11/11/2009  . Insomnia 07/03/2009  . POSTTRAUMATIC STRESS DISORDER 08/22/2008  . OBESITY 04/21/2008  . Osteoarthritis 04/21/2008  . MIGRAINE HEADACHE 04/20/2008  . Essential hypertension 04/20/2008   Billie Ruddy, PT, DPT [facility] [address] [place], [place], [address] Phone: [phone]   Fax:  [phone] 10/04/15, 3:00 PM  Name: Sandy Barrett MRN: [id number] Date of Birth: 1932-11-08

## 2015-10-07 ENCOUNTER — Ambulatory Visit: Payer: Medicare Other | Attending: Internal Medicine | Admitting: Physical Therapy

## 2015-10-07 DIAGNOSIS — I69351 Hemiplegia and hemiparesis following cerebral infarction affecting right dominant side: Secondary | ICD-10-CM | POA: Diagnosis not present

## 2015-10-07 DIAGNOSIS — R2681 Unsteadiness on feet: Secondary | ICD-10-CM | POA: Insufficient documentation

## 2015-10-07 DIAGNOSIS — R2689 Other abnormalities of gait and mobility: Secondary | ICD-10-CM | POA: Insufficient documentation

## 2015-10-07 NOTE — Therapy (Signed)
[facility] [address] [place], [place], [address] Phone: [phone]   Fax:  [phone]  Physical Therapy Treatment  Patient Details  Name: Sandy Barrett MRN: [id number] Date of Birth: 17-Apr-1932 Referring Provider: [name], MD  Encounter Date: 10/07/2015      PT End of Session - 10/07/15 1824    Visit Number 11   Number of Visits 14   Date for PT Re-Evaluation 10/17/15   Authorization Type Medicare Traditional primary; BCBS secondary   Authorization Time Period G Codes required   PT Start Time 1320   PT Stop Time 1400   PT Time Calculation (min) 40 min   Activity Tolerance Patient tolerated treatment well   Behavior During Therapy [facility] for tasks assessed/performed      Past Medical History:  Diagnosis Date  . ARTHRITIS   . Arthritis   . Diverticulosis   . HYPERTENSION   . HYPERTHYROIDISM   . Hyperthyroidism    s/p I-131 ablation 03/2011 of multinod goiter  . INSOMNIA   . MIGRAINE HEADACHE   . OBESITY   . Posttraumatic stress disorder   . Pulmonary embolism ([place]) 03/2014   with DVT  . Stroke [facility]) 03/2014   dysarthria    Past Surgical History:  Procedure Laterality Date  . ABDOMINAL HYSTERECTOMY  1976  . BREAST SURGERY     biopsy  . RADIOLOGY WITH ANESTHESIA Left 03/08/2014   Procedure: RADIOLOGY WITH ANESTHESIA;  Surgeon: Rob Hickman, MD;  Location: [place];  Service: Radiology;  Laterality: Left;  . TONSILLECTOMY    . TOTAL HIP ARTHROPLASTY  1998    right    There were no vitals filed for this visit.      Subjective Assessment - 10/07/15 1411    Subjective Per daughter, [name], "What about standing...is that something we're not working on anymore?"   Patient is accompained by: Family member  daughter, [name]   Pertinent History PMH significant for: L MCA CVA (03/2014) with severe R spastic hemiplegia, global aphasia, and apraxia; HTN, HLD hyperthyroidism, OA, PE with DVT  (03/2014), R THA.   Patient Stated Goals Per daughter, "Strengthen her legs in a more active way - maybe use the machines (NuStep) and try some standing. I'm not interrested in working on transfers, because I can't physically help her with that at home."   Currently in Pain? No/denies                         [facility] Adult PT Treatment/Exercise - 10/07/15 0001      Wheelchair Mobility   Wheelchair Mobility Yes   Wheelchair Assistance 5: Supervision;4: Min assist   Wheelchair Propulsion Left upper extremity   Wheelchair Parts Management Needs assistance  hand-over-hand assist to turn on/off   Distance 700   Comments Pt performed power w/c mobility x550' over unlevel, paved outdoor surfaces x550' using LUE and large joystick. Pt (S) for linear w/c mobility, min A for 90 and 180-degree turning, and was able to negotiate 3 of 5 door frames with supervision (required hand-over-hand assist for 2 of 5 door frames due to pt too close to L aspect of frame). Pt also negotiated standard ramp with min A to remain in center (due to pt tendency to remain to L). During linear and outdoor w/c mobility, speed was increased from lowest setting to 2 above slowest setting.  PT Education - 10/07/15 1818    Education provided Yes   Education Details Explained that the focus of PT has shifted to power w/c mobility. Explained that family may be able to purchase a standing frame to enable pt to safely stand at home; however, use of standing frame in PT is not skilled intervention.    Person(s) Educated Patient;Child(ren)  daughter   Methods Explanation   Comprehension Verbalized understanding          PT Short Term Goals - 10/04/15 1433      PT SHORT TERM GOAL #1   Title Pt will safely drive power w/c for 710' over level, indoor surfaces with mod A, 50% cueing for R visual attention.  (Target: 09/23/15)   Baseline Met 9/29.   Status Achieved     PT SHORT TERM GOAL #2    Title While using power chair, pt will consistently perform 180-degree turns with mod A, 50% cueing to indicate increased independence with w/c management.    (Target: 09/23/15)   Baseline Met 9/29   Status Achieved     PT SHORT TERM GOAL #3   Title Pt will perform 360-degree turns with mod A, 50% cueing to indicate increased independence with w/c management.    (Target: 09/23/15)   Baseline 9/29: partially met, as pt inconsistently performs 360-degree turns on demand with mod A.   Status Partially Met           PT Long Term Goals - 09/02/15 1439      PT LONG TERM GOAL #1   Title Pt will safely drive power w/c for 626' over level, indoor surfaces with min guard, 25% cueing for R visual attention. (10/14/15)   Status New     PT LONG TERM GOAL #2   Title While using power chair, pt will consistently perform 180-degree turns with min guard, 25% cueing to indicate increased independence with w/c management.   (10/14/15)   Status New     PT LONG TERM GOAL #3   Title Pt will perform 360-degree turns with min guard, 25% cueing to indicate increased independence with w/c management.   (10/14/15)   Status New     PT LONG TERM GOAL #4   Title Complete assessment for power w/c vs. tilt-in-space w/c to  preserve skin integrity and maximize pt independence with mobility.  (10/14/15)   Status New               Plan - 10/07/15 1824    Clinical Impression Statement Session focused on continued power w/c mobility with emphasis on negotiation of doorways, ramps, and outdoor surfaces. Pt required (S) for power w/c mobility over unlevel, paved outdoor surfaces x550'; and required min A to turn 90 degrees or greater. Wheelchair seating/positioning specialist Josh planning to attend next scheduled PT session to assess pt readiness for power w/c.   Rehab Potential Fair   Clinical Impairments Affecting Rehab Potential chronicity of R hemiplegia; R inattention; global aphasia; and chronic knee pain    PT Frequency Other (comment)  8 sessions   PT Duration 6 weeks   PT Treatment/Interventions ADLs/Self Care Home Management;Neuromuscular re-education;Functional mobility training;Patient/family education;Therapeutic activities;Balance training;Orthotic Fit/Training;Therapeutic exercise;Wheelchair mobility training   PT Next Visit Plan ATP Josh to be present to assess if pt now an appropriate candidate for power w/c.   Consulted and Agree with Plan of Care Patient;Family member/caregiver   Family Member Consulted daughter, [name]      Patient will benefit from  skilled therapeutic intervention in order to improve the following deficits and impairments:  Decreased cognition, Decreased coordination, Decreased mobility, Impaired tone, Impaired UE functional use, Decreased knowledge of use of DME, Impaired sensation  Visit Diagnosis: Hemiplegia and hemiparesis following cerebral infarction affecting right dominant side (HCC)  Other abnormalities of gait and mobility       G-Codes - Oct 27, 2015 1827    Functional Assessment Tool Used Supervision for linear power w/c mobility; min A for turning greater than 90 degrees and for negotiation of door sills   Functional Limitation Other PT primary   Other PT Primary Current Status (Z6109) At least 20 percent but less than 40 percent impaired, limited or restricted   Other PT Primary Goal Status (U0454) At least 1 percent but less than 20 percent impaired, limited or restricted      Problem List Patient Active Problem List   Diagnosis Date Noted  . Discoloration of skin 09/26/2015  . Dermatitis 09/13/2015  . Essential thrombocytosis ([place]) 07/31/2015  . SVC (superior vena cava obstruction), chronic 07/20/2015  . Dysphagia 07/20/2015  . Elevated liver enzymes 07/20/2015  . Constipation 07/20/2015  . Thrombophlebitis of breast, right 06/18/2015  . Tracheal deviation 06/06/2015  . Sensorineural hearing loss, bilateral, moderate-moderately severe  04/30/2015  . Abdominal wall lump 03/09/2015  . Frequent UTI 02/06/2015  . Primary osteoarthritis involving multiple joints 07/26/2014  . Pernicious anemia 07/26/2014  . Hearing loss 07/26/2014  . Encounter for therapeutic drug monitoring 07/17/2014  . Spastic hemiparesis affecting dominant side ([place]), right 05/31/2014  . DVT of lower extremity, bilateral ([place]) 03/14/2014  . Global aphasia 03/14/2014  . 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 Aphasia due to stroke ([place]) 03/13/2014  . Cerebral infarction due to embolism of left middle cerebral artery ([place])   . Stroke, embolic ([place]) 09/81/1914  . History of pulmonary embolism 03/08/2014  . Back pain 08/29/2013  . Primary localized osteoarthrosis, lower leg 03/06/2013  . IBS (irritable bowel syndrome)   . Multinodular goiter 01/13/2011  . Hyperthyroidism 11/11/2009  . Insomnia 07/03/2009  . POSTTRAUMATIC STRESS DISORDER 08/22/2008  . OBESITY 04/21/2008  . Osteoarthritis 04/21/2008  . MIGRAINE HEADACHE 04/20/2008  . Essential hypertension 04/20/2008    Billie Ruddy, PT, DPT [facility] [address] [place], [place], [address] Phone: [phone]   Fax:  [phone] 2015/10/27, 6:29 PM  Name: Sandy Barrett MRN: [id number] Date of Birth: [birth year]

## 2015-10-09 ENCOUNTER — Ambulatory Visit: Payer: Medicare Other | Admitting: Physical Therapy

## 2015-10-09 DIAGNOSIS — R2681 Unsteadiness on feet: Secondary | ICD-10-CM | POA: Diagnosis not present

## 2015-10-09 DIAGNOSIS — R2689 Other abnormalities of gait and mobility: Secondary | ICD-10-CM

## 2015-10-09 DIAGNOSIS — I69351 Hemiplegia and hemiparesis following cerebral infarction affecting right dominant side: Secondary | ICD-10-CM

## 2015-10-09 NOTE — Therapy (Signed)
[facility] [address] [place], [place], [address] Phone: [phone]   Fax:  [phone]  Physical Therapy Treatment  Patient Details  Name: Sandy Barrett MRN: [id number] Date of Birth: [birth year] Referring Provider: [name], MD  Encounter Date: 10/09/2015      PT End of Session - 10/09/15 2034    Visit Number 12   Number of Visits 14   Date for PT Re-Evaluation 10/17/15   Authorization Type Medicare Traditional primary; BCBS secondary   Authorization Time Period G Codes required   PT Start Time 1318   PT Stop Time 1358   PT Time Calculation (min) 40 min   Activity Tolerance Patient tolerated treatment well   Behavior During Therapy [facility] for tasks assessed/performed      Past Medical History:  Diagnosis Date  . ARTHRITIS   . Arthritis   . Diverticulosis   . HYPERTENSION   . HYPERTHYROIDISM   . Hyperthyroidism    s/p I-131 ablation 03/2011 of multinod goiter  . INSOMNIA   . MIGRAINE HEADACHE   . OBESITY   . Posttraumatic stress disorder   . Pulmonary embolism ([place]) 03/2014   with DVT  . Stroke [facility]) 03/2014   dysarthria    Past Surgical History:  Procedure Laterality Date  . ABDOMINAL HYSTERECTOMY  1976  . BREAST SURGERY     biopsy  . RADIOLOGY WITH ANESTHESIA Left 03/08/2014   Procedure: RADIOLOGY WITH ANESTHESIA;  Surgeon: Rob Hickman, MD;  Location: [place];  Service: Radiology;  Laterality: Left;  . TONSILLECTOMY    . TOTAL HIP ARTHROPLASTY  1998    right    There were no vitals filed for this visit.      Subjective Assessment - 10/09/15 2025    Subjective Pt arrived accompanied by daughter, who had many questions for seating/positioning specialist, Josh.   Patient is accompained by: Family member  daughter, Sandy Barrett   Pertinent History PMH significant for: L MCA CVA (03/2014) with severe R spastic hemiplegia, global aphasia, and apraxia; HTN, HLD hyperthyroidism, OA, PE with  DVT (03/2014), R THA.   Patient Stated Goals Per daughter, "Strengthen her legs in a more active way - maybe use the machines (NuStep) and try some standing. I'm not interrested in working on transfers, because I can't physically help her with that at home."   Currently in Pain? No/denies                         [facility] Adult PT Treatment/Exercise - 10/09/15 0001      Transfers   Transfers Squat Pivot Transfers   Squat Pivot Transfers 1: +2 Total assist   Squat Pivot Transfers: Patient Percentage 10%   Squat Pivot Transfer Details (indicate cue type and reason) from personal manual w/c <> power w/c with +2A     Ambulation/Gait   Ambulation/Gait No  Pt non-ambulatory     Wheelchair Mobility   Wheelchair Mobility Yes   Wheelchair Assistance 3: Mod assist;4: Min assist;4: Min guard;5: Supervision   Wheelchair Propulsion Left upper extremity   Wheelchair Parts Management Needs assistance  hand-over-hand assist to turn on/off   Distance 550   Comments With seating/positioning specialist, Josh Cadle, present, pt utilized LUE to perform power w/c mobility x450' over level, indoor and unlevel, paved surfaces with (S) for linear mobility, min guard-min A to navigate w/c around cars parked close to sidewalk (especially to R of midline), and mod  A, 50% cueing to negotiate door frames due to impaired perception, decreased attention to R visual field. Pt did perform 180-degree turn with supervision and a large turning radius. Upon returning indoors, pt traversed obstacles x4 (each approx.. 5-7' away from the next) with min A and 25-50% cueing.                PT Education - 10/09/15 2026    Education provided Yes   Education Details Daughter, seating/positioning specialist, and this PT discussed pros/cons of power wheelchair, manual wheelchair, the importance of tilt/recline for pressure relief, and on the timing/process of obtaining the w/c. Daughter to find out weight capacity  of [name] to ensure ability to safely accomodate power w/c.  patient present for discussion, but participation in conversation limited by global aphasia   Person(s) Educated Patient;Child(ren)   Methods Explanation   Comprehension Verbalized understanding          PT Short Term Goals - 10/04/15 1433      PT SHORT TERM GOAL #1   Title Pt will safely drive power w/c for 917' over level, indoor surfaces with mod A, 50% cueing for R visual attention.  (Target: 09/23/15)   Baseline Met 9/29.   Status Achieved     PT SHORT TERM GOAL #2   Title While using power chair, pt will consistently perform 180-degree turns with mod A, 50% cueing to indicate increased independence with w/c management.    (Target: 09/23/15)   Baseline Met 9/29   Status Achieved     PT SHORT TERM GOAL #3   Title Pt will perform 360-degree turns with mod A, 50% cueing to indicate increased independence with w/c management.    (Target: 09/23/15)   Baseline 9/29: partially met, as pt inconsistently performs 360-degree turns on demand with mod A.   Status Partially Met           PT Long Term Goals - 09/02/15 1439      PT LONG TERM GOAL #1   Title Pt will safely drive power w/c for 915' over level, indoor surfaces with min guard, 25% cueing for R visual attention. (10/14/15)   Status New     PT LONG TERM GOAL #2   Title While using power chair, pt will consistently perform 180-degree turns with min guard, 25% cueing to indicate increased independence with w/c management.   (10/14/15)   Status New     PT LONG TERM GOAL #3   Title Pt will perform 360-degree turns with min guard, 25% cueing to indicate increased independence with w/c management.   (10/14/15)   Status New     PT LONG TERM GOAL #4   Title Complete assessment for power w/c vs. tilt-in-space w/c to  preserve skin integrity and maximize pt independence with mobility.  (10/14/15)   Status New               Plan - 10/09/15 2035    Clinical Impression  Statement Seating/positionin specialist, Felton Clinton, present for this session to assess/address pt progress with w/c mobility and to assist in answering caregiver questions regarding w/c recommendations, process of obtaining w/c. Planned w/c evaluation for 11/01/15.   Rehab Potential Fair   Clinical Impairments Affecting Rehab Potential chronicity of R hemiplegia; R inattention; global aphasia; and chronic knee pain   PT Frequency Other (comment)  8 sessions   PT Duration 6 weeks   PT Treatment/Interventions ADLs/Self Care Home Management;Neuromuscular re-education;Functional mobility training;Patient/family education;Therapeutic activities;Balance training;Orthotic Fit/Training;Therapeutic  exercise;Wheelchair mobility training   PT Next Visit Plan ATP Josh planning to modify power w/c joystick to midline.    Consulted and Agree with Plan of Care Patient;Family member/caregiver   Family Member Consulted daughter, Sandy Barrett      Patient will benefit from skilled therapeutic intervention in order to improve the following deficits and impairments:  Decreased cognition, Decreased coordination, Decreased mobility, Impaired tone, Impaired UE functional use, Decreased knowledge of use of DME, Impaired sensation  Visit Diagnosis: Hemiplegia and hemiparesis following cerebral infarction affecting right dominant side (HCC)  Other abnormalities of gait and mobility  Unsteadiness on feet     Problem List Patient Active Problem List   Diagnosis Date Noted  . Discoloration of skin 09/26/2015  . Dermatitis 09/13/2015  . Essential thrombocytosis ([place]) 07/31/2015  . SVC (superior vena cava obstruction), chronic 07/20/2015  . Dysphagia 07/20/2015  . Elevated liver enzymes 07/20/2015  . Constipation 07/20/2015  . Thrombophlebitis of breast, right 06/18/2015  . Tracheal deviation 06/06/2015  . Sensorineural hearing loss, bilateral, moderate-moderately severe 04/30/2015  . Abdominal wall lump  03/09/2015  . Frequent UTI 02/06/2015  . Primary osteoarthritis involving multiple joints 07/26/2014  . Pernicious anemia 07/26/2014  . Hearing loss 07/26/2014  . Encounter for therapeutic drug monitoring 07/17/2014  . Spastic hemiparesis affecting dominant side ([place]), right 05/31/2014  . DVT of lower extremity, bilateral ([place]) 03/14/2014  . Global aphasia 03/14/2014  . 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 Aphasia due to stroke ([place]) 03/13/2014  . Cerebral infarction due to embolism of left middle cerebral artery ([place])   . Stroke, embolic ([place]) 90/50/2561  . History of pulmonary embolism 03/08/2014  . Back pain 08/29/2013  . Primary localized osteoarthrosis, lower leg 03/06/2013  . IBS (irritable bowel syndrome)   . Multinodular goiter 01/13/2011  . Hyperthyroidism 11/11/2009  . Insomnia 07/03/2009  . POSTTRAUMATIC STRESS DISORDER 08/22/2008  . OBESITY 04/21/2008  . Osteoarthritis 04/21/2008  . MIGRAINE HEADACHE 04/20/2008  . Essential hypertension 04/20/2008    Billie Ruddy, PT, DPT [facility] [address] [place], [place], [address] Phone: [phone]   Fax:  [phone] 10/09/15, 8:39 PM  Name: Sheenah Dimitroff MRN: [id number] Date of Birth: 11-27-1932

## 2015-10-10 ENCOUNTER — Telehealth: Payer: Self-pay | Admitting: Emergency Medicine

## 2015-10-10 NOTE — Telephone Encounter (Signed)
If she only has bruising in one area and not in several areas of her body AND the area of bruising does not hurt she most likely does not need to be seen.  She may have injured the area without realizing it.  Is she has bruises in more than one area or has any pain or swelling she should be seen.

## 2015-10-10 NOTE — Telephone Encounter (Signed)
Pts daughter called to inform that she noticed Bruising on right arm, cluster of bruises. First appeared yesterday. Does not recall pt getting injured. Please advise if this is something that needs to be evaluated.

## 2015-10-11 NOTE — Telephone Encounter (Signed)
Spoke with pts daughter, Rosanne Ashing, to inform of MDs response.

## 2015-10-14 ENCOUNTER — Ambulatory Visit: Payer: Medicare Other | Admitting: Physical Therapy

## 2015-10-14 DIAGNOSIS — R2681 Unsteadiness on feet: Secondary | ICD-10-CM | POA: Diagnosis not present

## 2015-10-14 DIAGNOSIS — I69351 Hemiplegia and hemiparesis following cerebral infarction affecting right dominant side: Secondary | ICD-10-CM | POA: Diagnosis not present

## 2015-10-14 DIAGNOSIS — R2689 Other abnormalities of gait and mobility: Secondary | ICD-10-CM | POA: Diagnosis not present

## 2015-10-14 NOTE — Therapy (Signed)
[facility] [address] [place], [place], [address] Phone: [phone]   Fax:  [phone]  Physical Therapy Treatment  Patient Details  Name: Sandy Barrett MRN: [id number] Date of Birth: Aug 31, 1932 Referring Provider: [name], MD  Encounter Date: 10/14/2015      PT End of Session - 10/14/15 1406    Visit Number 13   Number of Visits 14   Date for PT Re-Evaluation 10/17/15   Authorization Type Medicare Traditional primary; BCBS secondary   Authorization Time Period G Codes required   PT Start Time 1317   PT Stop Time 1350  Session ended early due to all goals met, patient discharged   PT Time Calculation (min) 33 min   Activity Tolerance Patient tolerated treatment well   Behavior During Therapy [facility] for tasks assessed/performed      Past Medical History:  Diagnosis Date  . ARTHRITIS   . Arthritis   . Diverticulosis   . HYPERTENSION   . HYPERTHYROIDISM   . Hyperthyroidism    s/p I-131 ablation 03/2011 of multinod goiter  . INSOMNIA   . MIGRAINE HEADACHE   . OBESITY   . Posttraumatic stress disorder   . Pulmonary embolism ([place]) 03/2014   with DVT  . Stroke [facility]) 03/2014   dysarthria    Past Surgical History:  Procedure Laterality Date  . ABDOMINAL HYSTERECTOMY  1976  . BREAST SURGERY     biopsy  . RADIOLOGY WITH ANESTHESIA Left 03/08/2014   Procedure: RADIOLOGY WITH ANESTHESIA;  Surgeon: Rob Hickman, MD;  Location: [place];  Service: Radiology;  Laterality: Left;  . TONSILLECTOMY    . TOTAL HIP ARTHROPLASTY  1998    right    There were no vitals filed for this visit.      Subjective Assessment - 10/14/15 1409    Subjective Daughter, Sandy Barrett, in full agreement with pursuing power w/c for mobility. Sandy Barrett with many questions about whether or not pt can continue to attend PT to "exercise her legs," per daughter.   Patient is accompained by: Family member  daughter, Sandy Barrett   Pertinent  History PMH significant for: L MCA CVA (03/2014) with severe R spastic hemiplegia, global aphasia, and apraxia; HTN, HLD hyperthyroidism, OA, PE with DVT (03/2014), R THA.   Patient Stated Goals Per daughter, "Strengthen her legs in a more active way - maybe use the machines (NuStep) and try some standing. I'm not interrested in working on transfers, because I can't physically help her with that at home."   Currently in Pain? No/denies                                 PT Education - 10/14/15 1410    Education provided Yes   Education Details Explained PT goals, findings, progress, and DC plan. Explained plan for power w/c assessment on 10/27. In reference to daughter's questions about whether pt may continue to attend PT to strengthen legs, this PT explained that education on [employer] was provided to facilitate caregiver understanding of safe/appropriate equipment for pt to utilize at senior/fitness center.   Person(s) Educated Patient;Child(ren)   Methods Explanation   Comprehension Verbalized understanding          PT Short Term Goals - 10/14/15 1407      PT SHORT TERM GOAL #1   Title Pt will safely drive power w/c for 884' over level, indoor surfaces with mod  A, 50% cueing for R visual attention.  (Target: 09/23/15)   Baseline Met 9/29.   Status Achieved     PT SHORT TERM GOAL #2   Title While using power chair, pt will consistently perform 180-degree turns with mod A, 50% cueing to indicate increased independence with w/c management.    (Target: 09/23/15)   Baseline Met 9/29   Status Achieved     PT SHORT TERM GOAL #3   Title Pt will perform 360-degree turns with mod A, 50% cueing to indicate increased independence with w/c management.    (Target: 09/23/15)   Baseline 9/29: partially met, as pt inconsistently performs 360-degree turns on demand with mod A.   Status Partially Met           PT Long Term Goals - 26-Oct-2015 1407      PT LONG TERM GOAL #1    Title Pt will safely drive power w/c for 161' over level, indoor surfaces with min guard, 25% cueing for R visual attention. (2015/10/26)   Baseline Met 10-26-2022.   Status Achieved     PT LONG TERM GOAL #2   Title While using power chair, pt will consistently perform 180-degree turns with min guard, 25% cueing to indicate increased independence with w/c management.   (2015-10-26)   Baseline Met 10/26/2022.   Status Achieved     PT LONG TERM GOAL #3   Title Pt will perform 360-degree turns with min guard, 25% cueing to indicate increased independence with w/c management.   (2015-10-26)   Baseline Deferred, as 360-degree turns not functional   Status Deferred     PT LONG TERM GOAL #4   Title Complete assessment for power w/c vs. tilt-in-space w/c to  preserve skin integrity and maximize pt independence with mobility.  (October 26, 2015)   Baseline Power wheelchair assessment scheduled for 11/01/15.   Status Achieved               Plan - 10-26-2015 1416    Clinical Impression Statement Patient has met all goals and therefore will be discharged from outpatient PT at this time. Using LUE, pt performed power w/c mobility x200' over level, indoor surfaces with mod I, increase time; then x225' over unlevel, paved sidewalk with supervision, 25% cueing. Power w/c assessment scheduled fro 11/01/15. Daughter in full agreement with DC plan.    Consulted and Agree with Plan of Care Patient;Family member/caregiver   Family Member Consulted daughter, Sandy Barrett      Patient will benefit from skilled therapeutic intervention in order to improve the following deficits and impairments:     Visit Diagnosis: Hemiplegia and hemiparesis following cerebral infarction affecting right dominant side ([place])       G-Codes - 10-26-15 1612    Functional Assessment Tool Used Mod I for indoor power w/c mobility; supervision for community power w/c mobility   Functional Limitation Other PT primary   Other PT Primary Goal Status (W9604) At  least 1 percent but less than 20 percent impaired, limited or restricted   Other PT Primary Discharge Status (V4098) At least 1 percent but less than 20 percent impaired, limited or restricted      Problem List Patient Active Problem List   Diagnosis Date Noted  . Discoloration of skin 09/26/2015  . Dermatitis 09/13/2015  . Essential thrombocytosis ([place]) 07/31/2015  . SVC (superior vena cava obstruction), chronic 07/20/2015  . Dysphagia 07/20/2015  . Elevated liver enzymes 07/20/2015  . Constipation 07/20/2015  . Thrombophlebitis of breast, right  06/18/2015  . Tracheal deviation 06/06/2015  . Sensorineural hearing loss, bilateral, moderate-moderately severe 04/30/2015  . Abdominal wall lump 03/09/2015  . Frequent UTI 02/06/2015  . Primary osteoarthritis involving multiple joints 07/26/2014  . Pernicious anemia 07/26/2014  . Hearing loss 07/26/2014  . Encounter for therapeutic drug monitoring 07/17/2014  . Spastic hemiparesis affecting dominant side ([place]), right 05/31/2014  . DVT of lower extremity, bilateral ([place]) 03/14/2014  . Global aphasia 03/14/2014  . 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 Aphasia due to stroke ([place]) 03/13/2014  . Cerebral infarction due to embolism of left middle cerebral artery ([place])   . Stroke, embolic ([place]) 59/97/7414  . History of pulmonary embolism 03/08/2014  . Back pain 08/29/2013  . Primary localized osteoarthrosis, lower leg 03/06/2013  . IBS (irritable bowel syndrome)   . Multinodular goiter 01/13/2011  . Hyperthyroidism 11/11/2009  . Insomnia 07/03/2009  . POSTTRAUMATIC STRESS DISORDER 08/22/2008  . OBESITY 04/21/2008  . Osteoarthritis 04/21/2008  . MIGRAINE HEADACHE 04/20/2008  . Essential hypertension 04/20/2008    .PHYSICAL THERAPY DISCHARGE SUMMARY  Visits from Start of Care: 13  Current functional level related to goals / functional outcomes: See above.   Remaining deficits: Pt continues to demonstrate significant R  spasticity hemiplegia due to CVA. See above.   Education / Equipment: See above.  Plan: Patient agrees to discharge.  Patient goals were met. Patient is being discharged due to meeting the stated rehab goals.  ?????            Name: Sandy Barrett MRN: [id number] Date of Birth: 1932-08-06   Billie Ruddy, PT, [facility] [address] [place], [place], [address] Phone: [phone]   Fax:  [phone] 10/14/15, 4:17 PM

## 2015-10-17 ENCOUNTER — Ambulatory Visit: Payer: No Typology Code available for payment source | Admitting: Physical Therapy

## 2015-10-22 ENCOUNTER — Ambulatory Visit: Payer: No Typology Code available for payment source | Admitting: Physical Therapy

## 2015-10-24 ENCOUNTER — Ambulatory Visit: Payer: No Typology Code available for payment source | Admitting: Physical Therapy

## 2015-10-25 ENCOUNTER — Other Ambulatory Visit (HOSPITAL_BASED_OUTPATIENT_CLINIC_OR_DEPARTMENT_OTHER): Payer: Medicare Other

## 2015-10-25 ENCOUNTER — Other Ambulatory Visit: Payer: Self-pay

## 2015-10-25 ENCOUNTER — Ambulatory Visit: Payer: Self-pay

## 2015-10-25 ENCOUNTER — Ambulatory Visit (HOSPITAL_BASED_OUTPATIENT_CLINIC_OR_DEPARTMENT_OTHER): Payer: Medicare Other | Admitting: Hematology and Oncology

## 2015-10-25 VITALS — BP 111/74 | HR 77 | Temp 97.2°F | Resp 16 | Ht 60.0 in | Wt 176.0 lb

## 2015-10-25 DIAGNOSIS — I82403 Acute embolism and thrombosis of unspecified deep veins of lower extremity, bilateral: Secondary | ICD-10-CM

## 2015-10-25 DIAGNOSIS — D473 Essential (hemorrhagic) thrombocythemia: Secondary | ICD-10-CM | POA: Diagnosis not present

## 2015-10-25 DIAGNOSIS — Z23 Encounter for immunization: Secondary | ICD-10-CM | POA: Diagnosis not present

## 2015-10-25 DIAGNOSIS — G8111 Spastic hemiplegia affecting right dominant side: Secondary | ICD-10-CM

## 2015-10-25 DIAGNOSIS — Z86711 Personal history of pulmonary embolism: Secondary | ICD-10-CM

## 2015-10-25 DIAGNOSIS — G8110 Spastic hemiplegia affecting unspecified side: Secondary | ICD-10-CM

## 2015-10-25 LAB — CBC WITH DIFFERENTIAL/PLATELET
BASO%: 1.3 % (ref 0.0–2.0)
Basophils Absolute: 0.1 10*3/uL (ref 0.0–0.1)
EOS%: 1.8 % (ref 0.0–7.0)
Eosinophils Absolute: 0.1 10*3/uL (ref 0.0–0.5)
HCT: 40.6 % (ref 34.8–46.6)
HGB: 13.5 g/dL (ref 11.6–15.9)
LYMPH%: 35.3 % (ref 14.0–49.7)
MCH: 36.4 pg — ABNORMAL HIGH (ref 25.1–34.0)
MCHC: 33.3 g/dL (ref 31.5–36.0)
MCV: 109.4 fL — ABNORMAL HIGH (ref 79.5–101.0)
MONO#: 0.4 10*3/uL (ref 0.1–0.9)
MONO%: 11 % (ref 0.0–14.0)
NEUT#: 1.9 10*3/uL (ref 1.5–6.5)
NEUT%: 50.6 % (ref 38.4–76.8)
Platelets: 268 10*3/uL (ref 145–400)
RBC: 3.71 10*6/uL (ref 3.70–5.45)
RDW: 20 % — ABNORMAL HIGH (ref 11.2–14.5)
WBC: 3.8 10*3/uL — ABNORMAL LOW (ref 3.9–10.3)
lymph#: 1.4 10*3/uL (ref 0.9–3.3)
nRBC: 0 % (ref 0–0)

## 2015-10-25 LAB — PROTIME-INR
INR: 2.8 (ref 2.00–3.50)
Protime: 33.6 Seconds — ABNORMAL HIGH (ref 10.6–13.4)

## 2015-10-25 MED ORDER — INFLUENZA VAC SPLIT QUAD 0.5 ML IM SUSY
0.5000 mL | PREFILLED_SYRINGE | Freq: Once | INTRAMUSCULAR | Status: AC
  Administered 2015-10-25: 0.5 mL via INTRAMUSCULAR
  Filled 2015-10-25: qty 0.5

## 2015-10-25 NOTE — Assessment & Plan Note (Addendum)
The patient is requesting INR monitoring today to save her a trip to her PCP office. I will order INR per patient request but with the understanding that her primary care doctor will be adjusting her warfarin dose. INR is stable today, continue the same dose

## 2015-10-25 NOTE — Assessment & Plan Note (Signed)
She had history of recurrent stroke in the past. She is currently on medical management and chronic anticoagulation therapy. The patient is debilitated and is dependent on her daughter for all activities of daily living

## 2015-10-25 NOTE — Assessment & Plan Note (Signed)
She tolerated hydroxyurea well. Her recent skin rashes not related to hydroxyurea. Her counts are stable with the dose of Hydrea to 500 mg daily except on Saturdays and Sundays she takes 1000 mg. With mild persistent leukopenia, I would recommend reducing the dose of Hydrea to 500 mg daily  I plan to recheck in 3 months

## 2015-10-25 NOTE — Assessment & Plan Note (Addendum)
She has history of DVT and bilateral lower extremity edema I recommend she continues using elastic compression hose.

## 2015-10-25 NOTE — Progress Notes (Signed)
Sandy Barrett OFFICE PROGRESS NOTE  Patient Care Team: [name], MD as PCP - General (Internal Medicine) Eunice Blase, MD (Inactive) as Consulting Physician (Family Medicine) Renato Shin, MD as Consulting Physician (Endocrinology) Netta Cedars, MD as Consulting Physician (Orthopedic Surgery) Lyndal Pulley, DO (Sports Medicine) Garvin Fila, MD (Neurology)  SUMMARY OF ONCOLOGIC HISTORY:   Essential thrombocytosis ([place])   08/07/2013 Miscellaneous    The patient is noted to have elevated platelet count      03/08/2014 Imaging    Positive for acute PE with CT evidence of right heart strain (RV/LV Ratio = 0.9) consistent with at least submassive (intermediate risk)PE.       03/09/2014 Imaging    US venous Doppler showed deep vein thrombosis noted in the right distal common femoral vein, femoral vein, and popliteal vein. DVT noted in the left femoral and popliteal veins      03/09/2014 Imaging    Patchy areas of acute left MCA territory infarction. 2. Several punctate foci of acute right MCA territory infarction. Possible trace subarachnoid hemorrhage in the high right frontal lobe. 3. Occluded left ICA and left MCA      03/20/2015 Imaging    Compared to MRI on 03/09/14, there has been expected evolutional change of left MCA infarction. In addition, there may be a few areas of acute-subacute infarcts in the left basal ganglia vs artifact.        07/31/2015 Pathology Results    Peripheral blood is positive for JAK2 mutation      07/31/2015 -  Chemotherapy    She is started on 500 mg daily Hydrea      08/15/2015 Miscellaneous    The dose of hydroxyurea is increased to 1000 mg daily      09/26/2015 Miscellaneous    Dose of Hydrea to 500 mg daily except on Saturdays and Sundays she takes 1000 mg.       INTERVAL HISTORY: Please see below for problem oriented charting. She feels well. The dermatitis has resolved but she is left with some minor skin  discoloration. She denies side effects of treatment. The patient denies any recent signs or symptoms of bleeding such as spontaneous epistaxis, hematuria or hematochezia.  REVIEW OF SYSTEMS:   Constitutional: Denies fevers, chills or abnormal weight loss Eyes: Denies blurriness of vision Ears, nose, mouth, throat, and face: Denies mucositis or sore throat Respiratory: Denies cough, dyspnea or wheezes Cardiovascular: Denies palpitation, chest discomfort or lower extremity swelling Gastrointestinal:  Denies nausea, heartburn or change in bowel habits Skin: Denies abnormal skin rashes Lymphatics: Denies new lymphadenopathy or easy bruising Neurological:Denies numbness, tingling or new weaknesses Behavioral/Psych: Mood is stable, no new changes  All other systems were reviewed with the patient and are negative.  I have reviewed the past medical history, past surgical history, social history and family history with the patient and they are unchanged from previous note.  ALLERGIES:  has No Known Allergies.  MEDICATIONS:  Current Outpatient Prescriptions  Medication Sig Dispense Refill  . acetaminophen (TYLENOL) 325 MG tablet Take 1-2 tablets (325-650 mg total) by mouth every 4 (four) hours as needed for mild pain.    [name] albuterol (PROVENTIL HFA) 108 (90 Base) MCG/ACT inhaler Inhale 2 puffs into the lungs every 6 (six) hours as needed for wheezing or shortness of breath. 1 Inhaler 1  . diclofenac sodium (VOLTAREN) 1 % GEL APPLY (2GMS) TOPICALLY THREE TIMES DAILY. (Patient taking differently: APPLY (2GMS) TOPICALLY THREE TIMES DAILY AS  NEEDED FOR PAIN) 300 g 3  . gabapentin (NEURONTIN) 100 MG capsule Take 1 capsule (100 mg total) by mouth 3 (three) times daily. (Patient taking differently: Take 100 mg by mouth 2 (two) times daily. ) 90 capsule 5  . hydrocortisone 1 % ointment APPLY 1 APPLICATION TOPICALLY 2 (TWO) TIMES DAILY. 28.35 g 0  . hydroxyurea (HYDREA) 500 MG capsule Take 2 capsules (1,000  mg total) by mouth daily. May take with food to minimize GI side effects. (Patient taking differently: Take 500 mg by mouth daily. One capsule daily except Sat and Sun take two capsules daily.  May take with food to minimize GI side effects.) 60 capsule 9  . methimazole (TAPAZOLE) 5 MG tablet Take 1 tablet (5 mg total) by mouth 2 (two) times a week. 12 tablet 2  . pantoprazole sodium (PROTONIX) 40 mg/20 mL PACK Take 20 mLs (40 mg total) by mouth daily. 30 each 5  . polyethylene glycol (MIRALAX) packet Take 17 g by mouth 2 (two) times daily. (Patient taking differently: Take 17 g by mouth daily as needed. ) 14 each 0  . Probiotic Product (ALIGN) 4 MG CAPS Take 1 capsule by mouth daily.     . propranolol (INDERAL) 10 MG tablet Take 1 tablet (10 mg total) by mouth 2 (two) times daily. (Patient taking differently: Take 10 mg by mouth daily. ) 60 tablet 3  . terconazole (TERAZOL 7) 0.4 % vaginal cream Place 1 applicator vaginally at bedtime. 90 g 3  . traMADol (ULTRAM) 50 MG tablet Take 1-2 tablets by mouth 3 times daily as needed 180 tablet 1  . traZODone (DESYREL) 50 MG tablet Take 1 tablet (50 mg total) by mouth at bedtime as needed. for sleep 30 tablet 5  . warfarin (COUMADIN) 4 MG tablet Take as directed by anticoagulation clinic. (Patient taking differently: Take as directed by anticoagulation clinic. Patient is taking 5 mg daily as 08/22/15. Verify dosage with patient upon admitting, changes weekly based on INR.) 40 tablet 3   Current Facility-Administered Medications  Medication Dose Route Frequency Provider Last Rate Last Dose  . albuterol (PROVENTIL) (2.5 MG/3ML) 0.083% nebulizer solution 2.5 mg  2.5 mg Nebulization Once Burnard Hawthorne, FNP        PHYSICAL EXAMINATION: ECOG PERFORMANCE STATUS: 2 - Symptomatic, <50% confined to bed  Vitals:   10/25/15 1503  BP: 111/74  Pulse: 77  Resp: 16  Temp: 97.2 F (36.2 C)   Filed Weights   10/25/15 1503  Weight: 176 lb (79.8 kg)     GENERAL:alert, no distress and comfortable SKIN: skin color, texture, turgor are normal, no rashes or significant lesions EYES: normal, Conjunctiva are pink and non-injected, sclera clear OROPHARYNX:no exudate, no erythema and lips, buccal mucosa, and tongue normal  NECK: supple, thyroid normal size, non-tender, without nodularity LYMPH:  no palpable lymphadenopathy in the cervical, axillary or inguinal LUNGS: clear to auscultation and percussion with normal breathing effort HEART: regular rate & rhythm and no murmurs and no lower extremity edema ABDOMEN:abdomen soft, non-tender and normal bowel sounds Musculoskeletal:no cyanosis of digits and no clubbing  NEURO: alert & oriented x 3 with dysarthria LABORATORY DATA:  I have reviewed the data as listed    Component Value Date/Time   NA 139 07/19/2015 1620   K 3.7 07/19/2015 1620   CL 104 07/19/2015 1620   CO2 28 07/19/2015 1620   GLUCOSE 78 07/19/2015 1620   BUN 11 07/19/2015 1620   CREATININE 0.39 (L) 07/19/2015  1620   CALCIUM 9.5 07/19/2015 1620   PROT 6.8 07/19/2015 1620   ALBUMIN 3.6 07/19/2015 1620   AST 16 07/19/2015 1620   ALT 49 (H) 07/19/2015 1620   ALKPHOS 125 (H) 07/19/2015 1620   BILITOT 1.5 (H) 07/19/2015 1620   GFRNONAA >60 06/27/2015 0454   GFRAA >60 06/27/2015 0454    No results found for: SPEP, UPEP  Lab Results  Component Value Date   WBC 3.8 (L) 10/25/2015   NEUTROABS 1.9 10/25/2015   HGB 13.5 10/25/2015   HCT 40.6 10/25/2015   MCV 109.4 (H) 10/25/2015   PLT 268 10/25/2015      Chemistry      Component Value Date/Time   NA 139 07/19/2015 1620   K 3.7 07/19/2015 1620   CL 104 07/19/2015 1620   CO2 28 07/19/2015 1620   BUN 11 07/19/2015 1620   CREATININE 0.39 (L) 07/19/2015 1620      Component Value Date/Time   CALCIUM 9.5 07/19/2015 1620   ALKPHOS 125 (H) 07/19/2015 1620   AST 16 07/19/2015 1620   ALT 49 (H) 07/19/2015 1620   BILITOT 1.5 (H) 07/19/2015 1620      ASSESSMENT & PLAN:   Essential thrombocytosis ([place]) She tolerated hydroxyurea well. Her recent skin rashes not related to hydroxyurea. Her counts are stable with the dose of Hydrea to 500 mg daily except on Saturdays and Sundays she takes 1000 mg. With mild persistent leukopenia, I would recommend reducing the dose of Hydrea to 500 mg daily  I plan to recheck in 3 months  History of pulmonary embolism The patient is requesting INR monitoring today to save her a trip to her PCP office. I will order INR per patient request but with the understanding that her primary care doctor will be adjusting her warfarin dose. INR is stable today, continue the same dose  Spastic hemiparesis affecting dominant side ([place]), right She had history of recurrent stroke in the past. She is currently on medical management and chronic anticoagulation therapy. The patient is debilitated and is dependent on her daughter for all activities of daily living  DVT of lower extremity, bilateral ([place]) She has history of DVT and bilateral lower extremity edema   No orders of the defined types were placed in this encounter.  All questions were answered. The patient knows to call the clinic with any problems, questions or concerns. No barriers to learning was detected. I spent 15 minutes counseling the patient face to face. The total time spent in the appointment was 20 minutes and more than 50% was on counseling and review of test results     Heath Lark, MD 10/25/2015 3:47 PM

## 2015-10-27 NOTE — Progress Notes (Signed)
I have reviewed and agree with the plan. 

## 2015-11-01 ENCOUNTER — Ambulatory Visit: Payer: Medicare Other | Admitting: Physical Therapy

## 2015-11-01 DIAGNOSIS — R2681 Unsteadiness on feet: Secondary | ICD-10-CM | POA: Diagnosis not present

## 2015-11-01 DIAGNOSIS — R2689 Other abnormalities of gait and mobility: Secondary | ICD-10-CM | POA: Diagnosis not present

## 2015-11-01 DIAGNOSIS — I69351 Hemiplegia and hemiparesis following cerebral infarction affecting right dominant side: Secondary | ICD-10-CM | POA: Diagnosis not present

## 2015-11-01 NOTE — Therapy (Addendum)
[facility] [address] [place], [place], [address] Phone: [phone]   Fax:  [phone]  Patient Details  Name: Sandy Barrett MRN: [id number] Date of Birth: 1932/07/15 Referring Provider:  [name], MD  Encounter Date: 11/01/2015    Mobility/Seating Evaluation    PATIENT INFORMATION: Name: Sandy Barrett DOB: [birth year]  Sex: Female Date seen: 11/01/15 Time: 14:58  Address:  [place] Physician: Alysia Penna, MD This evaluation/justification form will serve as the LMN for the following suppliers: __________________________ Supplier: [facility] Contact Person: Luz Brazen, Wess Botts Phone:  [phone]   Seating Therapist: Billie Ruddy, PT, DPT Phone:   [phone]   Phone: [phone]    Spouse/Parent/Caregiver name: Meryem Haertel (daughter)  Phone number: [phone] Insurance/Payer: Medicare Traditional Primary; BCBS secondary     Reason for Referral: Power wheelchair  Patient/Caregiver Goals: To promote patient independence with mobility and preserve skin integrity.  Patient was seen for face-to-face evaluation for new power wheelchair.  Also present was Billie Ruddy, PT, DPT to discuss recommendations and wheelchair options.  Further paperwork was completed and sent to vendor.  Patient appears to qualify for power mobility device at this time per objective findings.   MEDICAL HISTORY: Diagnosis: Primary Diagnosis:  L MCA CVA Onset: 03/08/14 Diagnosis: severe R spastic hemiplegia, global aphasia, and apraxia    '[]' Progressive Disease Relevant past and future surgeries: R total hip replacment (1998), abdominal hysterectomy, tonsillectomy,breast biopsy   Height: 5'0 Weight: 175 Explain recent changes or trends in weight: N/A   History including Falls: HTN, HLD hyperthyroidism, OA, PE with DVT (03/2014), R THA.    HOME ENVIRONMENT: '[x]' House  '[]' Condo/town home  '[]' Apartment   '[]' Assisted Living    '[]' Lives Alone '[x]'  Lives with Others                                                                                          Hours with caregiver: 24/7  '[x]' Home is accessible to patient           Stairs      '[x]' Yes '[]'  No     Ramp '[x]' Yes '[]' No Comments:  Stairs in front; however, pt uses rear entry, which is ramped.   COMMUNITY ADL: TRANSPORTATION: '[]' Car    '[x]' Van    '[]' Public Transportation    '[]' Adapted w/c Lift    '[]' Ambulance    '[]' Other:       '[x]' Sits in wheelchair during transport  Employment/School: N/A Specific requirements pertaining to mobility ?????  Other: ?????    FUNCTIONAL/SENSORY PROCESSING SKILLS:  Handedness:   '[x]' Right     '[]' Left    '[]' NA  Comments:  ?????  Functional Processing Skills for Wheeled Mobility '[x]' Processing Skills are adequate for safe wheelchair operation  Areas of concern than may interfere with safe operation of wheelchair Description of problem   '[]'  Attention to environment      '[]' Judgment      '[]'  Hearing  '[]'  Vision or visual processing      '[]' Motor Planning  '[]'  Fluctuations in Behavior  Initially, attention to R visual field was of concern. After episode of physical  therapy focusing on safe operation of power wheelchair, patient has demonstrated that this is no longer a barrier to safe wheelchair operation.     VERBAL COMMUNICATION: '[]' WFL receptive '[]'  WFL expressive '[]' Understandable  '[]' Difficult to understand  '[x]' non-communicative '[]'  Uses an augmented communication device  CURRENT SEATING / MOBILITY: Current Mobility Base:  '[]' None '[]' Dependent '[x]' Manual '[]' Scooter '[]' Power  Type of Control: ?????  Manufacturer:  Sports coach III Size:  18" x 18"Age: 82 year,  2 months  Current Condition of Mobility Base:  Fair   Current Wheelchair components:  elevated leg rests, R arm trough, brake lock extenders, flip back arms, general use cushio  Describe posture in present seating system:  posterior pelvic tilt; excessive B hip adduction, internal  rotation      SENSATION and SKIN ISSUES: Sensation '[x]' Intact  '[]' Impaired '[]' Absent  Level of sensation: Difficult to discern due to global aphasia; however, pt responds to painful stimuli on RUE/RLE Pressure Relief: Able to perform effective pressure relief :    '[]' Yes  '[x]'  No Method: Attempted anterior, lateral weight shifts. If not, Why?:  Pt unable due to severity of deficits, difficulty completely unloading hips/  Skin Issues/Skin Integrity Current Skin Issues  '[x]' Yes '[]' No '[]' Intact '[]'  Red area'[x]'  Open Area  '[]' Scar Tissue '[]' At risk from prolonged sitting Where  2 open sores on sacrum.  History of Skin Issues  '[x]' Yes '[]' No Where  sacral, buttocks, and anterior thighs (where brief edge contacts skin)   When  04/2014 - present  Hx of skin flap surgeries  '[]' Yes '[x]' No Where  N/A When  N/A  Limited sitting tolerance '[]' Yes '[x]' No Hours spent sitting in wheelchair daily: All awake hours.  Complaint of Pain:  Please describe: Pt is globally aphasic, so unable to obtain pain descriptors. However, this therapist has observed  overt signs of discomfort (grimacing, moaning) with active and passive movement of bilateral knees.    Swelling/Edema: Edema in bilateral feet (moreso on R than L)   ADL STATUS (in reference to wheelchair use):  Indep Assist Unable Indep with Equip Not assessed Comments  Dressing ????? Total A ????? ????? ????? ?????  Eating ????? Supervision ????? ????? ????? Requires supervision, cueing for swallowing.  Toileting ????? ?????  unable ????? ????? Uses brief for bowel and bladder.  Bathing ????? Total A ????? ????? ????? Total A at bed-level  Grooming/Hygiene ????? Total A ????? ????? ????? ?????  Meal Prep ????? ?????  unable ????? ????? ?????  IADLS ????? ?????  unable ????? ????? ?????  Bowel Management: '[]' Continent  '[x]' Incontinent  '[]' Accidents Comments:  uses brief  Bladder Management: '[]' Continent  '[x]' Incontinent  '[]' Accidents Comments:  uses brief     WHEELCHAIR  SKILLS: Manual w/c Propulsion: '[x]' UE or LE strength and endurance sufficient to participate in ADLs using manual wheelchair Arm : '[]' left '[]' right   '[]' Both      Distance: ????? Foot:  '[]' left '[]' right   '[]' Both  Operate Scooter: '[]'  Strength, hand grip, balance and transfer appropriate for use '[]' Living environment is accessible for use of scooter  Operate Power w/c:  '[]'  Std. Joystick   '[x]'  Alternative Controls Indep '[x]'  Assist '[]'  Dependent/unable '[]'  N/A '[]'   '[x]' Safe          '[x]'  Functional      Distance: 550'  Bed confined without wheelchair '[x]'  Yes '[]'  No   STRENGTH/RANGE OF MOTION:  Active Range of Motion Strength  Shoulder No active movement in RUE. Passively, R shoulder flexion: 72 degrees and painful. L shoulder flexion to  94 degrees  No palpable muscle activation in RUE Unable to formally assess MMT in LUE/LLE due to global aphasia, apraxia.  Elbow No active movement in RUE. Passively, R elbow flexion to 130 degrees, R elbow extension to 0/neutral. L elbow flexion to 135 degrees; L elbow extension to neutral/0 degrees. No palpable muscle activation in RUE. Unable to formally assess MMT in LUE/LLE due to global aphasia, apraxia.  Wrist/Hand No active movement in RUE. Passively, R wrist flexion to 66 degrees, R wrist extension to 20 degrees. L wrist flexion to 78 degrees; L wrist flexion to 32 degrees  No palpable muscle activation in RUE. Unable to formally assess MMT in LUE/LLE due to global aphasia, apraxia.  Hip No active movement in RLE. Active L hip flexion (seated in wheelchair) to 91 degrees. Unable to formally assess extension due to safety concerns with active standing (pt has tolerated only short duration in standing frame).  No palpable muscle activation in RLE. Unable to formally assess MMT in LUE/LLE due to global aphasia, apraxia.  Knee No active movementin RLE. Passively, R knee extension -21 degrees, R knee flexion 74 degrees (limited by pain).  L knee extension: -29  degrees; L knee flexion: 79 degrees.  No palpable muscle activation in RLE. Unable to formally assess MMT in LUE/LLE due to global aphasia, apraxia.[name]  Ankle No active movementin RLE. Passively: R ankle dorsiflexion-2 degrees, R ankle plantarflexon 34 degrees. L ankle dorsiflexion: -6 degrees L ankle plantarflexion: 38 degrees  No palpable muscle activation in RLE. Unable to formally assess MMT in LUE/LLE due to global aphasia, apraxia.     MOBILITY/BALANCE:  '[x]'  Patient is totally dependent for mobility  ?????    Balance Transfers Ambulation  Sitting Balance: Standing Balance: '[]'  Independent '[]'  Independent/Modified Independent  '[]'  WFL     '[]'  WFL '[]'  Supervision '[]'  Supervision  '[]'  Uses UE for balance  '[]'  Supervision '[]'  Min Assist '[]'  Ambulates with Assist  ?????    '[]'  Min Assist '[]'  Min assist '[]'  Mod Assist '[]'  Ambulates with Device:      '[]'  RW  '[]'  StW  '[]'  Cane  '[]'  ?????  '[]'  Mod Assist '[]'  Mod assist '[]'  Max assist   '[]'  Max Assist '[]'  Max assist '[]'  Dependent '[]'  Indep. Short Distance Only  '[x]'  Unable '[x]'  Unable '[x]'  Lift / Sling Required Distance (in feet)  ?????   '[]'  Sliding board '[x]'  Unable to Ambulate (see explanation below)  Cardio Status:  '[x]' Intact  '[]'  Impaired   '[]'  NA     ?????  Respiratory Status:  '[x]' Intact   '[]' Impaired   '[]' NA     ?????  Orthotics/Prosthetics: N/A  Comments (Address manual vs power w/c vs scooter): Have attempted self-propulsion of maual wheelchair using hemi technique during past sessions; however, neither safe nor functional due to severity of deficits (apraxia, coordination impairments).         Anterior / Posterior Obliquity Rotation-Pelvis ?????  PELVIS    '[]'  '[x]'  '[]'   Neutral Posterior Anterior  '[]'  '[x]'  '[]'   WFL Rt elev Lt elev  '[]'  '[x]'  '[]'   WFL Right Left                      Anterior    Anterior     '[]'  Fixed '[]'  Other '[]'  Partly Flexible '[x]'  Flexible   '[]'  Fixed '[]'  Other '[]'  Partly Flexible  '[x]'  Flexible  '[]'  Fixed '[]'  Other '[]'  Partly Flexible  '[x]'  Flexible    TRUNK  '[]'  [  x] '[]'   WFL ? Thoracic ? Lumbar  Kyphosis Lordosis  '[]'  '[x]'  '[]'   WFL Convex Convex  Right Left '[x]' c-curve '[]' s-curve '[]' multiple  '[]'  Neutral '[x]'  Left-anterior '[]'  Right-anterior     '[]'  Fixed '[x]'  Flexible '[]'  Partly Flexible '[]'  Other  '[]'  Fixed '[x]'  Flexible '[]'  Partly Flexible '[]'  Other  '[]'  Fixed             '[]'  Flexible '[]'  Partly Flexible '[]'  Other    Position Windswept  ?????  HIPS          '[]'            '[]'               '[x]'    Neutral       Abduct        ADduct         '[]'           '[]'            '[]'   Neutral Right           Left      '[]'  Fixed '[]'  Subluxed '[x]'  Partly Flexible '[]'  Dislocated '[]'  Flexible  '[]'  Fixed '[]'  Other '[]'  Partly Flexible  '[]'  Flexible                 Foot Positioning Knee Positioning  ?????    '[x]'  WFL  '[]' Lt '[]' Rt '[]'  WFL  '[]' Lt '[x]' Rt    KNEES ROM concerns: ROM concerns:    & Dorsi-Flexed '[]' Lt '[]' Rt Knee flexion PROM limited to 74 degrees (by pain) on R.    FEET Plantar Flexed '[]' Lt '[]' Rt      Inversion                 '[]' Lt '[]' Rt      Eversion                 '[]' Lt '[]' Rt     HEAD '[x]'  Functional '[x]'  Good Head Control  ?????  & '[]'  Flexed         '[]'  Extended '[]'  Adequate Head Control    NECK '[]'  Rotated  Lt  '[]'  Lat Flexed Lt '[]'  Rotated  Rt '[]'  Lat Flexed Rt '[]'  Limited Head Control     '[]'  Cervical Hyperextension '[]'  Absent  Head Control     SHOULDERS ELBOWS WRIST& HAND R finger flexor contracture causing limited DIP extension.      Left     Right    Left     Right    Left     Right   U/E '[]' Functional           '[x]' Functional No active movement; but PROM grossly [facility] WFL '[]' Fisting             '[]' Fisting      '[]' elev   '[]' dep      '[]' elev   '[]' dep       '[]' pro -'[]' retract     '[]' pro  '[]' retract '[x]' subluxed             '[]' subluxed           Goals for Wheelchair Mobility  '[x]'  Independence with mobility in the home with motor related ADLs (MRADLs)  '[x]'  Independence with MRADLs in the community '[]'  Provide dependent mobility  '[]'  Provide recline     '[x]' Provide tilt   Goals for  Seating system '[x]'  Optimize pressure distribution '[x]'  Provide support needed to facilitate function or safety '[]'  Provide corrective forces to assist with  maintaining or improving posture '[]'  Accommodate client's posture:   current seated postures and positions are not flexible or will not tolerate corrective forces '[x]'  Client to be independent with relieving pressure in the wheelchair '[]' Enhance physiological function such as breathing, swallowing, digestion  Simulation ideas/Equipment trials:Patient has practiced using power w/c in previous sessions and is now safe and independent with power w/c mobility. State why other equipment was unsuccessful:Motor apraxia and coordination impairments impedes patient ability to perform self-propulsion of w/c using hemi technique.    MOBILITY BASE RECOMMENDATIONS and JUSTIFICATION: MOBILITY COMPONENT JUSTIFICATION  Manufacturer: Quantum?Model: J6 2SP-SS   Size: Width 18"Seat Depth 18" '[x]' provide transport from point A to B      '[x]' promote Indep mobility  '[x]' is not a safe, functional ambulator '[x]' walker or cane inadequate '[]' non-standard width/depth necessary to accommodate anatomical measurement '[]'  ?????  '[]' Manual Mobility Base '[]' non-functional ambulator    '[]' Scooter/POV  '[]' can safely operate  '[]' can safely transfer   '[]' has adequate trunk stability  '[]' cannot functionally propel manual w/c  '[x]' Power Mobility Base  '[x]' non-ambulatory  '[x]' cannot functionally propel manual wheelchair  '[x]'  cannot functionally and safely operate scooter/POV '[x]' can safely operate and willing to  '[]' Stroller Base '[]' infant/child  '[]' unable to propel manual wheelchair '[]' allows for growth '[]' non-functional ambulator '[]' non-functional UE '[]' Indep mobility is not a goal at this time  '[x]' Tilt  '[]' Forward '[x]' Backward '[x]' Powered tilt  '[]' Manual tilt  '[]' change position against gravitational force on head and shoulders  '[x]' change position for pressure relief/cannot weight  shift '[]' transfers  '[]' management of tone '[x]' rest periods '[]' control edema '[]' facilitate postural control  '[]'  ?????  '[]' Recline  '[]' Power recline on power base '[]' Manual recline on manual base  '[]' accommodate femur to back angle  '[]' bring to full recline for ADL care  '[]' change position for pressure relief/cannot weight shift '[]' rest periods '[]' repositioning for transfers or clothing/diaper /catheter changes '[]' head positioning  '[]' Lighter weight required '[]' self- propulsion  '[]' lifting '[]'  ?????  '[]' Heavy Duty required '[]' user weight greater than 250# '[]' extreme tone/ over active movement '[]' broken frame on previous chair '[]'  ?????  '[x]'  Back  '[]'  Angle Adjustable '[]'  Custom molded Synergy back '[]' postural control '[]' control of tone/spasticity '[]' accommodation of range of motion '[]' UE functional control '[]' accommodation for seating system '[]'  ????? '[]' provide lateral trunk support '[]' accommodate deformity '[x]' provide posterior trunk support '[]' provide lumbar/sacral support '[]' support trunk in midline '[]' Pressure relief over spinal processes  '[x]'  Seat Cushion Solution One skin protection cushion '[]' impaired sensation  '[]' decubitus ulcers present '[x]' history of pressure ulceration '[]' prevent pelvic extension '[x]' low maintenance  '[]' stabilize pelvis  '[]' accommodate obliquity '[]' accommodate multiple deformity '[]' neutralize lower extremity position '[x]' increase pressure distribution '[]'  ?????  '[]'  Pelvic/thigh support  '[]'  Lateral thigh guide '[]'  Distal medial pad  '[]'  Distal lateral pad '[]'  pelvis in neutral '[]' accommodate pelvis '[]'  position upper legs '[]'  alignment '[]'  accommodate ROM '[]'  decr adduction '[]' accommodate tone '[]' removable for transfers '[]' decr abduction  '[]'  Lateral trunk Supports '[]'  Lt     '[]'  Rt '[]' decrease lateral trunk leaning '[]' control tone '[]' contour for increased contact '[]' safety  '[]' accommodate asymmetry '[]'  ?????  '[x]'  Mounting hardware  '[]' lateral trunk supports  '[]' back   '[]' seat '[x]' headrest      '[]'   thigh support '[]' fixed   '[]' swing away '[]' attach seat platform/cushion to w/c frame '[]' attach back cushion to w/c frame '[]' mount postural supports '[x]' mount headrest  '[]' swing medial thigh support away '[]' swing lateral supports away for transfers  '[]'  ?????    Armrests  '[]' fixed '[x]' adjustable height '[]' removable   '[]' swing away  '[x]' flip back   '[]' reclining '[x]' full length pads '[]' desk    '[]'   pads tubular  '[x]' provide support with elbow at 90   '[]' provide support for w/c tray '[x]' change of height/angles for variable activities '[x]' remove for transfers '[x]' allow to come closer to table top '[]' remove for access to tables '[]'  ?????  Hangers/ Leg rests  '[]' 60 '[]' 70 '[]' 90 '[]' elevating '[]' heavy duty  '[]' articulating '[]' fixed '[]' lift off '[]' swing away     '[]' power '[]' provide LE support  '[]' accommodate to hamstring tightness '[]' elevate legs during recline   '[]' provide change in position for Legs '[]' Maintain placement of feet on footplate '[]' durability '[]' enable transfers '[]' decrease edema '[]' Accommodate lower leg length '[]'  ?????  Foot support Footplate    '[]' Lt  '[]'  Rt  '[x]'  Center mount '[x]' flip up     '[x]' depth/angle adjustable '[]' Amputee adapter    '[]'  Lt     '[]'  Rt '[x]' provide foot support '[]' accommodate to ankle ROM '[x]' transfers '[]' Provide support for residual extremity '[]'  allow foot to go under wheelchair base '[]'  decrease tone  '[]'  ?????  '[]'  Ankle strap/heel loops '[]' support foot on foot support '[]' decrease extraneous movement '[]' provide input to heel  '[]' protect foot  Tires: '[]' pneumatic  '[x]' flat free inserts  '[]' solid  '[]' decrease maintenance  '[]' prevent frequent flats '[]' increase shock absorbency '[]' decrease pain from road shock '[]' decrease spasms from road shock '[]'  ?????  '[x]'  Headrest  '[x]' provide posterior head support '[]' provide posterior neck support '[]' provide lateral head support '[]' provide anterior head support '[x]' support during tilt and recline '[]' improve feeding   '[]' improve respiration '[]' placement of  switches '[]' safety  '[]' accommodate ROM  '[]' accommodate tone '[]' improve visual orientation  '[]'  Anterior chest strap '[]'  Vest '[]'  Shoulder retractors  '[]' decrease forward movement of shoulder '[]' accommodation of TLSO '[]' decrease forward movement of trunk '[]' decrease shoulder elevation '[]' added abdominal support '[]' alignment '[]' assistance with shoulder control  '[]'  ?????  Pelvic Positioner '[x]' Belt '[]' SubASIS bar '[]' Dual Pull '[]' stabilize tone '[]' decrease falling out of chair/ **will not Decr potential for sliding due to pelvic tilting '[]' prevent excessive rotation '[]' pad for protection over boney prominence '[]' prominence comfort '[]' special pull angle to control rotation '[]'  ?????  Upper Extremity Support '[]' L   '[x]'  R '[x]' Arm trough    '[]' hand support '[]'  tray       '[]' full tray '[]' swivel mount '[x]' decrease edema      '[x]' decrease subluxation   '[]' control tone   '[]' placement for AAC/Computer/EADL '[]' decrease gravitational pull on shoulders '[]' provide midline positioning '[]' provide support to increase UE function '[]' provide hand support in natural position '[]' provide work surface   POWER WHEELCHAIR CONTROLS  '[x]' Proportional  '[]' Non-Proportional Type Swingaway Joystick '[]' Left  '[]' Right '[x]' provides access for controlling wheelchair   '[]' lacks motor control to operate proportional drive control '[]' unable to understand proportional controls  Actuator Control Module  '[x]' Single  '[]' Multiple   '[x]' Allow the client to operate the power seat function(s) through the joystick control   '[]' Safety Reset Switches '[]' Used to change modes and stop the wheelchair when driving in latch mode    '[]' Upgraded Electronics   '[]' programming for accurate control '[]' progressive Disease/changing condition '[]' non-proportional drive control needed '[]' Needed in order to operate power seat functions through joystick control   '[]' Display box '[]' Allows user to see in which mode and drive the wheelchair is set  '[]' necessary for alternate controls     '[]' Digital interface electronics '[]' Allows w/c to operate when using alternative drive controls  '[]' ASL Head Array '[]' Allows client to operate wheelchair  through switches placed in tri-panel headrest  '[]' Sip and puff with tubing kit '[]' needed to operate sip and puff drive controls  '[]' Upgraded tracking electronics '[]' increase safety when driving '[]' correct tracking when on uneven surfaces  '[x]' [facility] for  switches or joystick '[]' Attaches switches to w/c  '[x]' Swing away for access or transfers '[]' midline for optimal placement '[]' provides for consistent access  '[]' Attendant controlled joystick plus mount '[]' safety '[]' long distance driving '[]' operation of seat functions '[]' compliance with transportation regulations '[]'  ?????    Rear wheel placement/Axle adjustability '[]' None '[]' semi adjustable '[]' fully adjustable  '[]' improved UE access to wheels '[]' improved stability '[]' changing angle in space for improvement of postural stability '[]' 1-arm drive access '[]' amputee pad placement '[]'  ?????  Wheel rims/ hand rims  '[]' metal  '[]' plastic coated '[]' oblique projections '[]' vertical projections '[]' Provide ability to propel manual wheelchair  '[]'  Increase self-propulsion with hand weakness/decreased grasp  Push handles '[]' extended  '[]' angle adjustable  '[]' standard '[]' caregiver access '[]' caregiver assist '[]' allows "hooking" to enable increased ability to perform ADLs or maintain balance  One armed device  '[]' Lt   '[]' Rt '[]' enable propulsion of manual wheelchair with one arm   '[]'  ?????   Brake/wheel lock extension '[]'  Lt   '[]'  Rt '[]' increase indep in applying wheel locks   '[]' Side guards '[]' prevent clothing getting caught in wheel or becoming soiled '[]'  prevent skin tears/abrasions  Battery: Quantum U1 AGM Battery (x2) '[x]' to power wheelchair ?????  Other: ????? ????? ?????  The above equipment has a life- long use expectancy. Growth and changes in medical and/or functional conditions would be the exceptions. This is to certify that the therapist has  no financial relationship with durable medical provider or manufacturer. The therapist will not receive remuneration of any kind for the equipment recommended in this evaluation.   Patient has mobility limitation that significantly impairs safe, timely participation in one or more mobility related ADL's.  (bathing, toileting, feeding, dressing, grooming, moving from room to room)                                                             '[x]'  Yes '[]'  No Will mobility device sufficiently improve ability to participate and/or be aided in participation of MRADL's?         '[x]'  Yes '[]'  No Can limitation be compensated for with use of a cane or walker?                                                                                '[x]'  Yes '[]'  No Does patient or caregiver demonstrate ability/potential ability & willingness to safely use the mobility device?   '[x]'  Yes '[]'  No Does patient's home environment support use of recommended mobility device?                                                    '[x]'  Yes '[]'  No Does patient have sufficient upper extremity function necessary to functionally propel a manual wheelchair?    '[]'  Yes '[x]'  No Does patient have sufficient strength and trunk stability to safely operate a POV (scooter)?                                  '[]'   Yes '[x]'  No Does patient need additional features/benefits provided by a power wheelchair for MRADL's in the home?       '[x]'  Yes '[]'  No Does the patient demonstrate the ability to safely use a power wheelchair?                                                              '[x]'  Yes '[]'  No  Therapist Name Printed: Billie Ruddy, PT, DPT Date: 11/18/15  Therapist's Signature:   Date:   Supplier's Name Printed: [employer], [employer] Date: 11/18/15  Supplier's Signature:   Date:  Patient/Caregiver Signature:   Date:     This is to certify that I have read this evaluation and do agree with the content within:      Physician's Name Printed: ?????  Physician's  Signature:  Date:     This is to certify that I, the above signed therapist have the following affiliations: '[]'  This DME provider '[]'  Manufacturer of recommended equipment '[]'  Patient's long term care facility '[x]'  None of the above

## 2015-11-05 ENCOUNTER — Encounter (INDEPENDENT_AMBULATORY_CARE_PROVIDER_SITE_OTHER): Payer: Self-pay

## 2015-11-05 ENCOUNTER — Encounter: Payer: Self-pay | Admitting: Physical Medicine & Rehabilitation

## 2015-11-05 ENCOUNTER — Ambulatory Visit (HOSPITAL_BASED_OUTPATIENT_CLINIC_OR_DEPARTMENT_OTHER): Payer: Medicare Other | Admitting: Physical Medicine & Rehabilitation

## 2015-11-05 ENCOUNTER — Encounter: Payer: Medicare Other | Attending: Physical Medicine & Rehabilitation

## 2015-11-05 ENCOUNTER — Telehealth: Payer: Self-pay | Admitting: Internal Medicine

## 2015-11-05 VITALS — BP 116/75 | HR 84 | Resp 14

## 2015-11-05 DIAGNOSIS — I69320 Aphasia following cerebral infarction: Secondary | ICD-10-CM | POA: Diagnosis not present

## 2015-11-05 DIAGNOSIS — M7541 Impingement syndrome of right shoulder: Secondary | ICD-10-CM | POA: Diagnosis not present

## 2015-11-05 DIAGNOSIS — M6289 Other specified disorders of muscle: Secondary | ICD-10-CM | POA: Insufficient documentation

## 2015-11-05 DIAGNOSIS — I639 Cerebral infarction, unspecified: Secondary | ICD-10-CM | POA: Diagnosis not present

## 2015-11-05 DIAGNOSIS — G8190 Hemiplegia, unspecified affecting unspecified side: Secondary | ICD-10-CM | POA: Diagnosis not present

## 2015-11-05 DIAGNOSIS — G8110 Spastic hemiplegia affecting unspecified side: Secondary | ICD-10-CM

## 2015-11-05 NOTE — Patient Instructions (Signed)
Will try Dysport rather than Botox next injection

## 2015-11-05 NOTE — Progress Notes (Signed)
Subjective:    Patient ID: Sandy Barrett, female    DOB: 1932/04/09, 82 y.o.   MRN: [id number]  HPI  82 year old female with a large left MCA infarct resulting in chronic aphasia and right spastic hemiplegia. She underwent Botox injection on several occasions with good relief of her right upper extremity spasticity. However, daughter states that the injections wear off about 2 weeks prior to the next injection and the patient experiences quite a bit of pain during that last 2 weeks.  Pain Inventory Average Pain 3 Pain Right Now 3 My pain is intermittent, sharp, stabbing and aching  In the last 24 hours, has pain interfered with the following? General activity 6 Relation with others 6 Enjoyment of life 6 What TIME of day is your pain at its worst? n/a Sleep (in general) Fair  Pain is worse with: bending and standing Pain improves with: medication, TENS and injections Relief from Meds: 7  Mobility use a wheelchair Do you have any goals in this area?  no  Function retired I need assistance with the following:  dressing, bathing, toileting, meal prep, household duties and shopping Do you have any goals in this area?  no  Neuro/Psych bladder control problems bowel control problems weakness trouble walking  Prior Studies Any changes since last visit?  no  Physicians involved in your care Any changes since last visit?  no   Family History  Problem Relation Age of Onset  . Asthma Mother   . Asthma Father   . Prostate cancer Father   . Stroke Brother   . Breast cancer Sister   . Stomach cancer Sister   . Thyroid disease Neg Hx    Social History   Social History  . Marital status: Widowed    Spouse name: N/A  . Number of children: 2  . Years of education: 14   Occupational History  . retired     Restaurant manager, fast food   Social History Main Topics  . Smoking status: Never Smoker  . Smokeless tobacco: Never Used  . Alcohol use No  . Drug use: No  . Sexual  activity: No   Other Topics Concern  . None   Social History Narrative   Widowed, lived alone prior to CVA 03/2014   SNF at [facility], then home 07/2014 with dtr   Right handed   Caffeine use- occasionally drinks tea   Past Surgical History:  Procedure Laterality Date  . ABDOMINAL HYSTERECTOMY  1976  . BREAST SURGERY     biopsy  . RADIOLOGY WITH ANESTHESIA Left 03/08/2014   Procedure: RADIOLOGY WITH ANESTHESIA;  Surgeon: Rob Hickman, MD;  Location: [facility];  Service: Radiology;  Laterality: Left;  . TONSILLECTOMY    . TOTAL HIP ARTHROPLASTY  1998    right   Past Medical History:  Diagnosis Date  . ARTHRITIS   . Arthritis   . Diverticulosis   . HYPERTENSION   . HYPERTHYROIDISM   . Hyperthyroidism    s/p I-131 ablation 03/2011 of multinod goiter  . INSOMNIA   . MIGRAINE HEADACHE   . OBESITY   . Posttraumatic stress disorder   . Pulmonary embolism ([place]) 03/2014   with DVT  . Stroke ([place]) 03/2014   dysarthria   BP 116/75   Pulse 84   Resp 14   SpO2 93%   Opioid Risk Score:   Fall Risk Score:  `1  Depression screen PHQ 2/9  Depression screen [facility] 2/9 07/30/2015 06/21/2015 04/29/2015  03/18/2015 02/04/2015 11/26/2014 09/17/2014  Decreased Interest 0 0 0 0 0 0 0  Down, Depressed, Hopeless 0 0 1 1 1 1  0  PHQ - 2 Score 0 0 1 1 1 1  0  Some recent data might be hidden     Review of Systems  Gastrointestinal: Positive for constipation.  All other systems reviewed and are negative.      Objective:   Physical Exam A phasic, has difficulty following commands as well Ashworth score 2 at the right elbow flexors, 0 at the wrist flexors 2, at the lumbricals 2 at the FDP to digit #2 Ashworth 2 at the FPL. Motor strength nail in the right upper extremity. No evidence of skin breakdown in the right palm. Nails are well groomed. No odor.       Assessment & Plan:  1. Spastic hemiplegia secondary to left MCA infarct. She has good relief of her spasticity and  spasticity related pain with Botox injections to the FCR, FDS, FDP, FPL and FCU as well as pronator teres We discussed other treatment options,given that the Botox seems to wear off about 10 weeks after injection  Plan for Dysport on or after 12/24/2015 FCR 100 FDS 200 FDP 200 FPL, 100 PT 200 FCU 100

## 2015-11-05 NOTE — Telephone Encounter (Signed)
Please advise 

## 2015-11-05 NOTE — Telephone Encounter (Signed)
Patients daughter is calling saying they need a referral to [facility] for OT. They was wanting on a bathroom to be finish than [facility] told them once it was done they could come back and see her to help her with that. Please follow up with daughter. Thank you.

## 2015-11-05 NOTE — Telephone Encounter (Signed)
odered

## 2015-11-06 NOTE — Telephone Encounter (Signed)
LVM to informed family. And call back if any questions.

## 2015-11-09 ENCOUNTER — Other Ambulatory Visit: Payer: Self-pay | Admitting: Endocrinology

## 2015-11-15 ENCOUNTER — Telehealth: Payer: Self-pay | Admitting: Emergency Medicine

## 2015-11-15 DIAGNOSIS — B9620 Unspecified Escherichia coli [E. coli] as the cause of diseases classified elsewhere: Secondary | ICD-10-CM | POA: Diagnosis not present

## 2015-11-15 DIAGNOSIS — Z87440 Personal history of urinary (tract) infections: Secondary | ICD-10-CM | POA: Diagnosis not present

## 2015-11-15 DIAGNOSIS — N390 Urinary tract infection, site not specified: Secondary | ICD-10-CM | POA: Diagnosis not present

## 2015-11-15 DIAGNOSIS — R310 Gross hematuria: Secondary | ICD-10-CM | POA: Diagnosis not present

## 2015-11-15 NOTE — Telephone Encounter (Signed)
Received voicemail from pts daughter checking on referral to [facility]. LVM informing daughter that referral has been placed.

## 2015-11-19 ENCOUNTER — Other Ambulatory Visit: Payer: Self-pay | Admitting: Internal Medicine

## 2015-11-19 NOTE — Telephone Encounter (Signed)
RX faxed to [facility] 

## 2015-11-27 ENCOUNTER — Encounter: Payer: Medicare Other | Attending: Physical Medicine & Rehabilitation | Admitting: Registered Nurse

## 2015-11-27 ENCOUNTER — Encounter: Payer: Self-pay | Admitting: Registered Nurse

## 2015-11-27 VITALS — BP 99/72 | HR 81

## 2015-11-27 DIAGNOSIS — I69320 Aphasia following cerebral infarction: Secondary | ICD-10-CM | POA: Insufficient documentation

## 2015-11-27 DIAGNOSIS — R4701 Aphasia: Secondary | ICD-10-CM

## 2015-11-27 DIAGNOSIS — G8110 Spastic hemiplegia affecting unspecified side: Secondary | ICD-10-CM

## 2015-11-27 DIAGNOSIS — M6289 Other specified disorders of muscle: Secondary | ICD-10-CM | POA: Insufficient documentation

## 2015-11-27 DIAGNOSIS — G8190 Hemiplegia, unspecified affecting unspecified side: Secondary | ICD-10-CM | POA: Insufficient documentation

## 2015-11-27 DIAGNOSIS — I639 Cerebral infarction, unspecified: Secondary | ICD-10-CM | POA: Diagnosis not present

## 2015-11-27 DIAGNOSIS — M7541 Impingement syndrome of right shoulder: Secondary | ICD-10-CM | POA: Diagnosis not present

## 2015-11-27 DIAGNOSIS — G8191 Hemiplegia, unspecified affecting right dominant side: Secondary | ICD-10-CM

## 2015-11-27 DIAGNOSIS — I63412 Cerebral infarction due to embolism of left middle cerebral artery: Secondary | ICD-10-CM | POA: Diagnosis not present

## 2015-11-27 DIAGNOSIS — IMO0002 Reserved for concepts with insufficient information to code with codable children: Secondary | ICD-10-CM

## 2015-11-27 NOTE — Progress Notes (Signed)
Subjective:    Patient ID: Sandy Barrett, female    DOB: [birth year], 82 y.o.   MRN: [id number]  HPI: [name] is a 82 year old female who is here for a  Cabin crew. I have reviewed the Physical Therapist Wheelchair Evaluation Form and Concur with their assessment and evaluation. [name] will benefit from the Power Wheelchair with Tilt, this will help her with mobility in her home and activities of daily living such as bathing, dressing, toileting and transferring.    Another reason why [name] would benefit from the Power Wheelchair Tilt she is unable to pressure relieved herself, the Tilt will assist in this modality. She has open sores on her sacrum, buttocks and anterior thighs. [name] will need a skin protection cushion due to her history of skin breakdown.  [name] cannot use a cane, walker, manual wheelchair or scooter as stated in the physical therapy evaluation.  Her weight was noted to be 176lbs.    Pain Inventory Average Pain 4 Pain Right Now 4 My pain is sharp, stabbing and aching  In the last 24 hours, has pain interfered with the following? General activity 4 Relation with others 4 Enjoyment of life 4 What TIME of day is your pain at its worst? morning Sleep (in general) Fair  Pain is worse with: standing and some activites Pain improves with: therapy/exercise, medication, TENS and injections Relief from Meds: 7  Mobility how many minutes can you walk? 0 ability to climb steps?  no do you drive?  no use a wheelchair needs help with transfers Do you have any goals in this area?  yes  Function retired I need assistance with the following:  feeding, dressing, bathing, toileting, meal prep, household duties and shopping Do you have any goals in this area?  no  Neuro/Psych bladder control problems bowel control problems No problems in this area trouble walking dizziness  Prior Studies Any  changes since last visit?  no  Physicians involved in your care Any changes since last visit?  no   Family History  Problem Relation Age of Onset  . Asthma Mother   . Asthma Father   . Prostate cancer Father   . Stroke Brother   . Breast cancer Sister   . Stomach cancer Sister   . Thyroid disease Neg Hx    Social History   Social History  . Marital status: Widowed    Spouse name: N/A  . Number of children: 2  . Years of education: 14   Occupational History  . retired     Restaurant manager, fast food   Social History Main Topics  . Smoking status: Never Smoker  . Smokeless tobacco: Never Used  . Alcohol use No  . Drug use: No  . Sexual activity: No   Other Topics Concern  . None   Social History Narrative   Widowed, lived alone prior to CVA 03/2014   SNF at [facility], then home 07/2014 with dtr   Right handed   Caffeine use- occasionally drinks tea   Past Surgical History:  Procedure Laterality Date  . ABDOMINAL HYSTERECTOMY  1976  . BREAST SURGERY     biopsy  . RADIOLOGY WITH ANESTHESIA Left 03/08/2014   Procedure: RADIOLOGY WITH ANESTHESIA;  Surgeon: Rob Hickman, MD;  Location: [facility];  Service: Radiology;  Laterality: Left;  . TONSILLECTOMY    . TOTAL HIP ARTHROPLASTY  1998    right  Past Medical History:  Diagnosis Date  . ARTHRITIS   . Arthritis   . Diverticulosis   . HYPERTENSION   . HYPERTHYROIDISM   . Hyperthyroidism    s/p I-131 ablation 03/2011 of multinod goiter  . INSOMNIA   . MIGRAINE HEADACHE   . OBESITY   . Posttraumatic stress disorder   . Pulmonary embolism ([place]) 03/2014   with DVT  . Stroke ([place]) 03/2014   dysarthria   BP 99/72 (BP Location: Left Arm, Patient Position: Sitting, Cuff Size: Large)   Pulse 81   SpO2 91%   Opioid Risk Score:   Fall Risk Score:  `1  Depression screen PHQ 2/9  Depression screen [facility] 2/9 07/30/2015 06/21/2015 04/29/2015 03/18/2015 02/04/2015 11/26/2014 09/17/2014  Decreased Interest 0 0 0 0 0 0 0    Down, Depressed, Hopeless 0 0 1 1 1 1  0  PHQ - 2 Score 0 0 1 1 1 1  0  Some recent data might be hidden   Review of Systems  HENT: Negative.   Eyes: Negative.   Respiratory: Negative.   Cardiovascular: Negative.   Gastrointestinal:       Bowel control problems  Endocrine: Negative.   Genitourinary: Positive for difficulty urinating.  Musculoskeletal: Positive for gait problem.  Skin: Positive for rash.  Allergic/Immunologic: Negative.   Neurological: Positive for dizziness and weakness.  Hematological: Negative.   Psychiatric/Behavioral: Negative.        Objective:   Physical Exam  Constitutional: She appears well-developed and well-nourished.  Aphasic,  HENT:  Head: Normocephalic and atraumatic.  Neck: Normal range of motion. Neck supple.  Cardiovascular: Normal rate and regular rhythm.   Pulmonary/Chest: Effort normal and breath sounds normal.  Musculoskeletal:  Normal Muscle Bulk and Muscle Testing Reveals: Upper Extremities: Right: Paralysis Lower Extremities: Right Paralysis Left: Passive ROM  Arrived in Wheelchair  Skin: Skin is warm and dry.          Assessment & Plan:  1. Right Spastic Hemiplegia secondary to left MCA infarct: received Botox Treatments and will have Dysport in 12/17. 2. Hemiplegic Shoulder Pain/ Adhesive Capsulitis/ Chronic Shoulder Pain: Continue to Monitor and Passive ROM 3. Severe Spasticity of Right Upper Extremity

## 2015-11-30 ENCOUNTER — Other Ambulatory Visit: Payer: Self-pay | Admitting: Internal Medicine

## 2015-12-01 NOTE — Progress Notes (Signed)
Subjective:    Patient ID: Sandy Barrett, female    DOB: 1932/11/12, 82 y.o.   MRN: [id number]  HPI Pt returns for f/u of hyperthyroidism (due to a multinodular goiter; she had RAI in 2013; hyperthyroidism resolved, but it recurred in 2016; she has never had bx; she had a CVA in 2016, so she is not a surgical candidate, and can't be isolated for another dose of RAI).   dtr says pt has few mos of slight rash at the shoulders, but no assoc itching.    Past Medical History:  Diagnosis Date  . ARTHRITIS   . Arthritis   . Diverticulosis   . HYPERTENSION   . HYPERTHYROIDISM   . Hyperthyroidism    s/p I-131 ablation 03/2011 of multinod goiter  . INSOMNIA   . MIGRAINE HEADACHE   . OBESITY   . Posttraumatic stress disorder   . Pulmonary embolism ([place]) 03/2014   with DVT  . Stroke [facility]) 03/2014   dysarthria    Past Surgical History:  Procedure Laterality Date  . ABDOMINAL HYSTERECTOMY  1976  . BREAST SURGERY     biopsy  . RADIOLOGY WITH ANESTHESIA Left 03/08/2014   Procedure: RADIOLOGY WITH ANESTHESIA;  Surgeon: Rob Hickman, MD;  Location: [facility];  Service: Radiology;  Laterality: Left;  . TONSILLECTOMY    . TOTAL HIP ARTHROPLASTY  1998    right    Social History   Social History  . Marital status: Widowed    Spouse name: N/A  . Number of children: 2  . Years of education: 14   Occupational History  . retired     Restaurant manager, fast food   Social History Main Topics  . Smoking status: Never Smoker  . Smokeless tobacco: Never Used  . Alcohol use No  . Drug use: No  . Sexual activity: No   Other Topics Concern  . Not on file   Social History Narrative   Widowed, lived alone prior to CVA 03/2014   SNF at [facility], then home 07/2014 with dtr   Right handed   Caffeine use- occasionally drinks tea    Current Outpatient Prescriptions on File Prior to Visit  Medication Sig Dispense Refill  . acetaminophen (TYLENOL) 325 MG tablet Take 1-2 tablets (325-650 mg  total) by mouth every 4 (four) hours as needed for mild pain.    [name] albuterol (PROVENTIL HFA) 108 (90 Base) MCG/ACT inhaler Inhale 2 puffs into the lungs every 6 (six) hours as needed for wheezing or shortness of breath. 1 Inhaler 1  . diclofenac sodium (VOLTAREN) 1 % GEL APPLY (2GMS) TOPICALLY THREE TIMES DAILY. (Patient taking differently: APPLY (2GMS) TOPICALLY THREE TIMES DAILY AS NEEDED FOR PAIN) 300 g 3  . gabapentin (NEURONTIN) 100 MG capsule Take 1 capsule (100 mg total) by mouth 3 (three) times daily. (Patient taking differently: Take 100 mg by mouth 2 (two) times daily. ) 90 capsule 5  . hydrocortisone 1 % ointment APPLY 1 APPLICATION TOPICALLY 2 (TWO) TIMES DAILY. 28.35 g 0  . hydroxyurea (HYDREA) 500 MG capsule Take 2 capsules (1,000 mg total) by mouth daily. May take with food to minimize GI side effects. (Patient taking differently: Take by mouth. Take 1 tab daily) 60 capsule 9  . methimazole (TAPAZOLE) 5 MG tablet TAKE 1 TABLET BY MOUTH TWICE WEEKLY 12 tablet 2  . pantoprazole sodium (PROTONIX) 40 mg/20 mL PACK Take 20 mLs (40 mg total) by mouth daily. 30 each 5  . polyethylene  glycol (MIRALAX) packet Take 17 g by mouth 2 (two) times daily. (Patient taking differently: Take 17 g by mouth daily as needed. ) 14 each 0  . Probiotic Product (ALIGN) 4 MG CAPS Take 1 capsule by mouth daily.     . propranolol (INDERAL) 10 MG tablet TAKE 1 TABLET TWICE A DAY 60 tablet 5  . terconazole (TERAZOL 7) 0.4 % vaginal cream Place 1 applicator vaginally at bedtime. 90 g 3  . traMADol (ULTRAM) 50 MG tablet TAKE 1 TO 2 TABLETS BY MOUTH 3 TIMES A DAY AS NEEDED 180 tablet 1  . traZODone (DESYREL) 50 MG tablet Take 1 tablet (50 mg total) by mouth at bedtime as needed. for sleep 30 tablet 5  . warfarin (COUMADIN) 4 MG tablet Take as directed by anticoagulation clinic. (Patient taking differently: Take as directed by anticoagulation clinic. Patient is taking 5 mg daily as 08/22/15. Verify dosage with patient upon  admitting, changes weekly based on INR.) 40 tablet 3   Current Facility-Administered Medications on File Prior to Visit  Medication Dose Route Frequency Provider Last Rate Last Dose  . albuterol (PROVENTIL) (2.5 MG/3ML) 0.083% nebulizer solution 2.5 mg  2.5 mg Nebulization Once Burnard Hawthorne, FNP        No Known Allergies  Family History  Problem Relation Age of Onset  . Asthma Mother   . Asthma Father   . Prostate cancer Father   . Stroke Brother   . Breast cancer Sister   . Stomach cancer Sister   . Thyroid disease Neg Hx     BP 122/64   Pulse 77   SpO2 90%    Review of Systems Denies fever and weight change    Objective:   Physical Exam VITAL SIGNS:  See vs page.   GENERAL: no distress.  In wheelchair.  NECK: I cannot palpate the goiter.    Skin: slight hyperpigmented rash at the anterior shoulders.   Lab Results  Component Value Date   TSH 1.25 12/03/2015   T4TOTAL 7.6 08/21/2014      Assessment & Plan:  Hyperthyroidism: well-controlled. Please continue the same medication.   Rash, new, unlikely related to the tapazole.  Patient is advised the following: Patient Instructions  blood tests are requested for you today.  We'll let you know about the results.   if ever you have fever while taking methimazole, stop it and call us, even if the reason is obvious, because of the risk of a rare side-effect. If the rash persists, we can change to a different thyroid pill.   Please come back for a follow-up appointment in 4 months.

## 2015-12-03 ENCOUNTER — Encounter: Payer: Self-pay | Admitting: Endocrinology

## 2015-12-03 ENCOUNTER — Ambulatory Visit (INDEPENDENT_AMBULATORY_CARE_PROVIDER_SITE_OTHER): Payer: Medicare Other | Admitting: Endocrinology

## 2015-12-03 VITALS — BP 122/64 | HR 77

## 2015-12-03 DIAGNOSIS — I639 Cerebral infarction, unspecified: Secondary | ICD-10-CM | POA: Diagnosis not present

## 2015-12-03 DIAGNOSIS — E0590 Thyrotoxicosis, unspecified without thyrotoxic crisis or storm: Secondary | ICD-10-CM | POA: Diagnosis not present

## 2015-12-03 LAB — TSH: TSH: 1.25 u[IU]/mL (ref 0.35–4.50)

## 2015-12-03 LAB — T4, FREE: Free T4: 0.94 ng/dL (ref 0.60–1.60)

## 2015-12-03 NOTE — Patient Instructions (Signed)
blood tests are requested for you today.  We'll let you know about the results.   if ever you have fever while taking methimazole, stop it and call us, even if the reason is obvious, because of the risk of a rare side-effect. If the rash persists, we can change to a different thyroid pill.   Please come back for a follow-up appointment in 4 months.

## 2015-12-05 DIAGNOSIS — L858 Other specified epidermal thickening: Secondary | ICD-10-CM | POA: Diagnosis not present

## 2015-12-09 ENCOUNTER — Ambulatory Visit (INDEPENDENT_AMBULATORY_CARE_PROVIDER_SITE_OTHER): Payer: Medicare Other | Admitting: General Practice

## 2015-12-09 DIAGNOSIS — Z5181 Encounter for therapeutic drug level monitoring: Secondary | ICD-10-CM

## 2015-12-09 LAB — POCT INR: INR: 3

## 2015-12-09 NOTE — Patient Instructions (Signed)
Pre visit review using our clinic review tool, if applicable. No additional management support is needed unless otherwise documented below in the visit note. 

## 2015-12-09 NOTE — Progress Notes (Signed)
I agree with this plan.

## 2015-12-25 ENCOUNTER — Ambulatory Visit (INDEPENDENT_AMBULATORY_CARE_PROVIDER_SITE_OTHER): Payer: Medicare Other | Admitting: Podiatry

## 2015-12-25 ENCOUNTER — Encounter: Payer: Self-pay | Admitting: Podiatry

## 2015-12-25 VITALS — BP 120/55 | HR 76 | Resp 18

## 2015-12-25 DIAGNOSIS — B351 Tinea unguium: Secondary | ICD-10-CM | POA: Diagnosis not present

## 2015-12-25 DIAGNOSIS — M79675 Pain in left toe(s): Secondary | ICD-10-CM

## 2015-12-25 DIAGNOSIS — M79674 Pain in right toe(s): Secondary | ICD-10-CM

## 2015-12-26 NOTE — Progress Notes (Signed)
Patient ID: Sandy Barrett, female   DOB: 1932-02-05, 82 y.o.   MRN: [id number]   Subjective: This patient presents today with daughter present to treatment room complaining of thickened and uncomfortable hallux toenails left greater than right over an undetermined amount of time. The hallux nails primary left to become more uncomfortable and thickened when patient has direct shoe pressure over the toes Patient has history of stroke hemiparesis right side and is in a seated in a wheelchair and unable to transfer to treatment chair   Objective: Pleasant orientated 3 patient  Vascular: DP pulses 2/4 bilaterally PT pulses 1/4 bilaterally Capillary reflex immediate bilaterally  Dermatological: The hallux nails left greater right are deformed, discolored and hypertrophic The remaining toenails have texture and color changes with minimal deformity and palpable tenderness  Neurological: Ankle reflexes nonreactive right weekly reactive left Sensation to 10 g monofilament wire 0/5 right and 4/5 left  Musculoskeletal: Dorsi flexion and plantar flexion right 0/5 Dorsi flexion and plantar flexion left 5/5  Assessment: Stroke related right lower extremity weakness Symptomatic onychomycoses 6-10 with maximum symptoms and deformity in the hallux toenails   Plan:  Debridement of toenails 6-10 mechanically and electrically without any bleeding   Reappoint 3 months

## 2015-12-27 ENCOUNTER — Ambulatory Visit (HOSPITAL_BASED_OUTPATIENT_CLINIC_OR_DEPARTMENT_OTHER): Payer: Medicare Other | Admitting: Physical Medicine & Rehabilitation

## 2015-12-27 ENCOUNTER — Encounter: Payer: Self-pay | Admitting: Physical Medicine & Rehabilitation

## 2015-12-27 ENCOUNTER — Other Ambulatory Visit: Payer: Self-pay | Admitting: Internal Medicine

## 2015-12-27 ENCOUNTER — Encounter: Payer: Medicare Other | Attending: Physical Medicine & Rehabilitation

## 2015-12-27 VITALS — BP 125/71 | HR 81 | Resp 14

## 2015-12-27 DIAGNOSIS — M7541 Impingement syndrome of right shoulder: Secondary | ICD-10-CM | POA: Insufficient documentation

## 2015-12-27 DIAGNOSIS — M6289 Other specified disorders of muscle: Secondary | ICD-10-CM | POA: Insufficient documentation

## 2015-12-27 DIAGNOSIS — G8111 Spastic hemiplegia affecting right dominant side: Secondary | ICD-10-CM

## 2015-12-27 DIAGNOSIS — G8110 Spastic hemiplegia affecting unspecified side: Secondary | ICD-10-CM

## 2015-12-27 DIAGNOSIS — I69320 Aphasia following cerebral infarction: Secondary | ICD-10-CM | POA: Diagnosis not present

## 2015-12-27 DIAGNOSIS — G8190 Hemiplegia, unspecified affecting unspecified side: Secondary | ICD-10-CM | POA: Diagnosis not present

## 2015-12-27 MED ORDER — TIZANIDINE HCL 2 MG PO CAPS: 2.0000 mg | ORAL_CAPSULE | Freq: Every day | ORAL | 1 refills | Status: DC

## 2015-12-27 NOTE — Procedures (Signed)
Dysport Injection for spasticity using needle EMG guidance  Dilution: 200 Units/ml Indication: Severe spasticity which interferes with ADL,mobility and/or  hygiene and is unresponsive to medication management and other conservative care Informed consent was obtained after describing risks and benefits of the procedure with the patient. This includes bleeding, bruising, infection, excessive weakness, or medication side effects. A REMS form is on file and signed. Needle: 25 g 2" needle electrode Number of units per muscle Pronator teres, 200 FCU 100 FDP 200 FDS 200 FCR 100 FPL 200 All injections were done after obtaining appropriate EMG activity and after negative drawback for blood. The patient tolerated the procedure well. Post procedure instructions were given. A followup appointment was made.

## 2015-12-27 NOTE — Patient Instructions (Signed)
You received a Botox injection today. You may experience soreness at the needle injection sites. Please call us if any of the injection sites turns red after a couple days or if there is any drainage. You may experience muscle weakness as a result of Botox. This would improve with time but can take several weeks to improve. The Botox should start working in about one week. The Botox usually last 3 months. The injection can be repeated every 3 months as needed.  New spasm med is Tizanidine

## 2015-12-27 NOTE — Progress Notes (Signed)
Dysport Injection for spasticity using needle EMG guidance  Dilution: 200 Units/ml Indication: Severe spasticity which interferes with ADL,mobility and/or  hygiene and is unresponsive to medication management and other conservative care Informed consent was obtained after describing risks and benefits of the procedure with the patient. This includes bleeding, bruising, infection, excessive weakness, or medication side effects. A REMS form is on file and signed. Needle: 25 g 2" needle electrode Number of units per muscle Pronator teres, 200 FCU 100 FDP 200 FDS 200 FCR 100 FPL 200 All injections were done after obtaining appropriate EMG activity and after negative drawback for blood. The patient tolerated the procedure well. Post procedure instructions were given. A followup appointment was made.

## 2015-12-31 ENCOUNTER — Encounter: Payer: Self-pay | Admitting: Internal Medicine

## 2015-12-31 ENCOUNTER — Ambulatory Visit (INDEPENDENT_AMBULATORY_CARE_PROVIDER_SITE_OTHER): Payer: Medicare Other | Admitting: Internal Medicine

## 2015-12-31 VITALS — BP 114/78 | HR 77 | Temp 97.5°F | Resp 16 | Wt 186.0 lb

## 2015-12-31 DIAGNOSIS — I639 Cerebral infarction, unspecified: Secondary | ICD-10-CM

## 2015-12-31 DIAGNOSIS — H6123 Impacted cerumen, bilateral: Secondary | ICD-10-CM | POA: Diagnosis not present

## 2015-12-31 DIAGNOSIS — M7989 Other specified soft tissue disorders: Secondary | ICD-10-CM

## 2015-12-31 NOTE — Progress Notes (Signed)
Subjective:    Patient ID: Sandy Barrett, female    DOB: July 16, 1932, 82 y.o.   MRN: [id number]  HPI She is here for an acute visit.  She is here with her daughter. Her daughter provides the history since she is unable to.  Her ears have been bothering here.  She has been mashing her ears like it bothers her.  She has also been leaning in to hear.  Her daughters tried sweet oil and debrox.  This has been going on for a couple of months and did get better.  They didn't make this appointment a while ago, but wanted to have her years tract.  Feet swelling:  It was occurring intermittently, but now is more persistent.  She denies changes in medication or diet.  She is usually in bed for about 12 hours and when she wakes up she has no swelling.  She is in her wheelchair during the day and her legs hanging down for approximately 12 hours.  She does wear light compression socks on a fairly daily basis. Her mother is not able to communicate, but the swelling does not appear to bother her. She denies any skin breakdown or changes in skin in the feet or ankles. There has not been any noticeable swelling above the knees or in the abdomen. She denies any changes in her breathing.    Medications and allergies reviewed with patient and updated if appropriate.  Patient Active Problem List   Diagnosis Date Noted  . Discoloration of skin 09/26/2015  . Dermatitis 09/13/2015  . Essential thrombocytosis ([place]) 07/31/2015  . SVC (superior vena cava obstruction), chronic 07/20/2015  . Dysphagia 07/20/2015  . Elevated liver enzymes 07/20/2015  . Constipation 07/20/2015  . Thrombophlebitis of breast, right 06/18/2015  . Tracheal deviation 06/06/2015  . Sensorineural hearing loss, bilateral, moderate-moderately severe 04/30/2015  . Abdominal wall lump 03/09/2015  . Frequent UTI 02/06/2015  . Primary osteoarthritis involving multiple joints 07/26/2014  . Pernicious anemia 07/26/2014  . Hearing loss  07/26/2014  . Encounter for therapeutic drug monitoring 07/17/2014  . Spastic hemiparesis affecting dominant side ([place]), right 05/31/2014  . DVT of lower extremity, bilateral ([place]) 03/14/2014  . Global aphasia 03/14/2014  . 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 Aphasia due to stroke ([place]) 03/13/2014  . Cerebral infarction due to embolism of left middle cerebral artery ([place])   . Stroke, embolic ([place]) 123456  . History of pulmonary embolism 03/08/2014  . Back pain 08/29/2013  . Primary localized osteoarthrosis, lower leg 03/06/2013  . IBS (irritable bowel syndrome)   . Multinodular goiter 01/13/2011  . Hyperthyroidism 11/11/2009  . Insomnia 07/03/2009  . POSTTRAUMATIC STRESS DISORDER 08/22/2008  . OBESITY 04/21/2008  . Osteoarthritis 04/21/2008  . MIGRAINE HEADACHE 04/20/2008  . Essential hypertension 04/20/2008    Current Outpatient Prescriptions on File Prior to Visit  Medication Sig Dispense Refill  . acetaminophen (TYLENOL) 325 MG tablet Take 1-2 tablets (325-650 mg total) by mouth every 4 (four) hours as needed for mild pain.    [name] albuterol (PROVENTIL HFA) 108 (90 Base) MCG/ACT inhaler Inhale 2 puffs into the lungs every 6 (six) hours as needed for wheezing or shortness of breath. 1 Inhaler 1  . diclofenac sodium (VOLTAREN) 1 % GEL APPLY (2GMS) TOPICALLY THREE TIMES DAILY. (Patient taking differently: APPLY (2GMS) TOPICALLY THREE TIMES DAILY AS NEEDED FOR PAIN) 300 g 3  . fluconazole (DIFLUCAN) 150 MG tablet     . gabapentin (NEURONTIN) 100 MG capsule  Take 1 capsule (100 mg total) by mouth 3 (three) times daily. (Patient taking differently: Take 100 mg by mouth 2 (two) times daily. ) 90 capsule 5  . hydrocortisone 1 % ointment APPLY 1 APPLICATION TOPICALLY 2 (TWO) TIMES DAILY. 28.35 g 0  . hydroxyurea (HYDREA) 500 MG capsule Take 2 capsules (1,000 mg total) by mouth daily. May take with food to minimize GI side effects. (Patient taking differently: Take by mouth. Take 1 tab  daily) 60 capsule 9  . methimazole (TAPAZOLE) 5 MG tablet TAKE 1 TABLET BY MOUTH TWICE WEEKLY 12 tablet 2  . pantoprazole sodium (PROTONIX) 40 mg/20 mL PACK Take 20 mLs (40 mg total) by mouth daily. 30 each 5  . polyethylene glycol (MIRALAX) packet Take 17 g by mouth 2 (two) times daily. (Patient taking differently: Take 17 g by mouth daily as needed. ) 14 each 0  . Probiotic Product (ALIGN) 4 MG CAPS Take 1 capsule by mouth daily.     . propranolol (INDERAL) 10 MG tablet TAKE 1 TABLET TWICE A DAY 60 tablet 5  . terconazole (TERAZOL 7) 0.4 % vaginal cream Place 1 applicator vaginally at bedtime. 90 g 3  . tizanidine (ZANAFLEX) 2 MG capsule Take 1 capsule (2 mg total) by mouth at bedtime. 30 capsule 1  . traMADol (ULTRAM) 50 MG tablet TAKE 1 TO 2 TABLETS BY MOUTH 3 TIMES A DAY AS NEEDED 180 tablet 1  . traZODone (DESYREL) 50 MG tablet Take 1 tablet (50 mg total) by mouth at bedtime as needed. for sleep 30 tablet 5  . warfarin (COUMADIN) 4 MG tablet TAKE AS DIRECTED BY ANTICOAGULATION CLINIC. 40 tablet 3   No current facility-administered medications on file prior to visit.     Past Medical History:  Diagnosis Date  . ARTHRITIS   . Arthritis   . Diverticulosis   . HYPERTENSION   . HYPERTHYROIDISM   . Hyperthyroidism    s/p I-131 ablation 03/2011 of multinod goiter  . INSOMNIA   . MIGRAINE HEADACHE   . OBESITY   . Posttraumatic stress disorder   . Pulmonary embolism ([place]) 03/2014   with DVT  . Stroke [facility]) 03/2014   dysarthria    Past Surgical History:  Procedure Laterality Date  . ABDOMINAL HYSTERECTOMY  1976  . BREAST SURGERY     biopsy  . RADIOLOGY WITH ANESTHESIA Left 03/08/2014   Procedure: RADIOLOGY WITH ANESTHESIA;  Surgeon: Rob Hickman, MD;  Location: [facility];  Service: Radiology;  Laterality: Left;  . TONSILLECTOMY    . TOTAL HIP ARTHROPLASTY  1998    right    Social History   Social History  . Marital status: Widowed    Spouse name: N/A  . Number of  children: 2  . Years of education: 14   Occupational History  . retired     Restaurant manager, fast food   Social History Main Topics  . Smoking status: Never Smoker  . Smokeless tobacco: Never Used  . Alcohol use No  . Drug use: No  . Sexual activity: No   Other Topics Concern  . Not on file   Social History Narrative   Widowed, lived alone prior to CVA 03/2014   SNF at [facility], then home 07/2014 with dtr   Right handed   Caffeine use- occasionally drinks tea    Family History  Problem Relation Age of Onset  . Asthma Mother   . Asthma Father   . Prostate cancer Father   .  Stroke Brother   . Breast cancer Sister   . Stomach cancer Sister   . Thyroid disease Neg Hx     Review of Systems  Constitutional: Negative for fever.  Respiratory: Negative for cough, shortness of breath and wheezing.   Cardiovascular: Positive for leg swelling.  Skin: Negative for color change and rash.       Objective:   Vitals:   12/31/15 1401  BP: 114/78  Pulse: 77  Resp: 16  Temp: 97.5 F (36.4 C)   Filed Weights   12/31/15 1401  Weight: 186 lb (84.4 kg)   Body mass index is 36.33 kg/m.   Physical Exam  Constitutional: She appears well-developed and well-nourished. No distress.  HENT:  Head: Normocephalic and atraumatic.  Right Ear: External ear normal.  Left Ear: External ear normal.  Her daughter and her consented to having her ears cleaned out. This was a mild - moderate amount of wax.  Lovena Le, CMA, removed some of the wax in the outer portions of years.  Exam post procedure revealed scant wax bilaterally. She tolerated the procedure well.  Neck: Neck supple. Thyromegaly present.  Cardiovascular: Normal rate and regular rhythm.   Pulmonary/Chest: Effort normal and breath sounds normal. No respiratory distress. She has no wheezes. She has no rales.  Abdominal: Soft. She exhibits no distension. There is no tenderness.  Musculoskeletal: She exhibits edema (swelling over  knee high sock, mild ankle swelling b/l).  Skin: Skin is warm and dry. She is not diaphoretic. No erythema.          Assessment & Plan:   See Problem List for Assessment and Plan of chronic medical problems.

## 2015-12-31 NOTE — Assessment & Plan Note (Signed)
Swelling likely related to her legs being dependent for 12 hours when she is in the wheelchair. Swelling resolves and nighttime when she is laying down. No symptoms consistent with heart failure, kidney disease or liver disease. She does not tolerate her legs being elevated for long, but her daughter will try to elevate them intermittently throughout the day She will try getting new compression socks-her current ones seem to fall down slightly and she gets swelling above the sock Edema does not seem to bother her Keep salt in her diet to a minimum Monitor for skin breakdown or ulcers Will avoid diuretics if possible

## 2015-12-31 NOTE — Patient Instructions (Addendum)
  Medications reviewed and updated.  No changes recommended at this time.     Please followup in 6 months   

## 2015-12-31 NOTE — Progress Notes (Signed)
Pre visit review using our clinic review tool, if applicable. No additional management support is needed unless otherwise documented below in the visit note. 

## 2015-12-31 NOTE — Assessment & Plan Note (Signed)
Some wax removed bilaterally resulting in only scant amount of wax remaining She tolerated the procedure well

## 2016-01-08 DIAGNOSIS — R3129 Other microscopic hematuria: Secondary | ICD-10-CM | POA: Diagnosis not present

## 2016-01-08 DIAGNOSIS — Z87440 Personal history of urinary (tract) infections: Secondary | ICD-10-CM | POA: Diagnosis not present

## 2016-01-16 ENCOUNTER — Telehealth: Payer: Self-pay | Admitting: Internal Medicine

## 2016-01-16 NOTE — Telephone Encounter (Signed)
Error

## 2016-01-17 ENCOUNTER — Ambulatory Visit (INDEPENDENT_AMBULATORY_CARE_PROVIDER_SITE_OTHER): Payer: Medicare Other | Admitting: Nurse Practitioner

## 2016-01-17 ENCOUNTER — Ambulatory Visit (INDEPENDENT_AMBULATORY_CARE_PROVIDER_SITE_OTHER): Payer: Medicare Other | Admitting: General Practice

## 2016-01-17 ENCOUNTER — Encounter: Payer: Self-pay | Admitting: Nurse Practitioner

## 2016-01-17 ENCOUNTER — Other Ambulatory Visit (INDEPENDENT_AMBULATORY_CARE_PROVIDER_SITE_OTHER): Payer: Medicare Other

## 2016-01-17 VITALS — BP 110/60 | HR 83 | Temp 98.1°F | Resp 12

## 2016-01-17 DIAGNOSIS — Z5181 Encounter for therapeutic drug level monitoring: Secondary | ICD-10-CM | POA: Diagnosis not present

## 2016-01-17 DIAGNOSIS — N939 Abnormal uterine and vaginal bleeding, unspecified: Secondary | ICD-10-CM

## 2016-01-17 DIAGNOSIS — R197 Diarrhea, unspecified: Secondary | ICD-10-CM

## 2016-01-17 DIAGNOSIS — R1312 Dysphagia, oropharyngeal phase: Secondary | ICD-10-CM | POA: Diagnosis not present

## 2016-01-17 LAB — BASIC METABOLIC PANEL
BUN: 13 mg/dL (ref 6–23)
CO2: 31 mEq/L (ref 19–32)
Calcium: 8.7 mg/dL (ref 8.4–10.5)
Chloride: 107 mEq/L (ref 96–112)
Creatinine, Ser: 0.5 mg/dL (ref 0.40–1.20)
GFR: 152.13 mL/min (ref >60.00)
Glucose, Bld: 81 mg/dL (ref 70–99)
Potassium: 4.1 mEq/L (ref 3.5–5.1)
Sodium: 141 mEq/L (ref 135–145)

## 2016-01-17 LAB — CBC WITH DIFFERENTIAL/PLATELET
Basophils Absolute: 0 10*3/uL (ref 0.0–0.1)
Basophils Relative: 0.6 % (ref 0.0–3.0)
Eosinophils Absolute: 0.1 10*3/uL (ref 0.0–0.7)
Eosinophils Relative: 2 % (ref 0.0–5.0)
HCT: 41.6 % (ref 36.0–46.0)
Hemoglobin: 13.8 g/dL (ref 12.0–15.0)
Lymphocytes Relative: 23.9 % (ref 12.0–46.0)
Lymphs Abs: 1.1 10*3/uL (ref 0.7–4.0)
MCHC: 33.2 g/dL (ref 30.0–36.0)
MCV: 111.6 fl — ABNORMAL HIGH (ref 78.0–100.0)
Monocytes Absolute: 0.4 10*3/uL (ref 0.1–1.0)
Monocytes Relative: 8.1 % (ref 3.0–12.0)
Neutro Abs: 3.1 10*3/uL (ref 1.4–7.7)
Neutrophils Relative %: 65.4 % (ref 43.0–77.0)
Platelets: 422 10*3/uL — ABNORMAL HIGH (ref 150.0–400.0)
RBC: 3.72 Mil/uL — ABNORMAL LOW (ref 3.87–5.11)
RDW: 14.9 % (ref 11.5–15.5)
WBC: 4.8 10*3/uL (ref 4.0–10.5)

## 2016-01-17 LAB — POCT INR: INR: 4

## 2016-01-17 NOTE — Progress Notes (Signed)
Pre visit review using our clinic review tool, if applicable. No additional management support is needed unless otherwise documented below in the visit note. 

## 2016-01-17 NOTE — Progress Notes (Signed)
Subjective:  Patient ID: Sandy Barrett, female    DOB: 1932-05-01  Age: 83 y.o. MRN: [id number]  CC: Acute Visit (redness under right breast since this morning, light vaginal bleeding on Tuesday and Thursday - does not want exam, diarrhea x 4 days)  Patient in office with daughter. Patient is non verbal.  Diarrhea   This is a new problem. The current episode started in the past 7 days. The problem occurs 2 to 4 times per day. The problem has been unchanged. The stool consistency is described as watery. The patient states that diarrhea does not awaken her from sleep. Pertinent negatives include no abdominal pain, chills, coughing, fever, URI or vomiting. Exacerbated by: oral abx for uti 1.5weeks ago. Risk factors include recent antibiotic use. She has tried increased fluids for the symptoms. The treatment provided no relief.  Vaginal Bleeding  The patient's primary symptoms include vaginal bleeding. The patient's pertinent negatives include no genital odor, pelvic pain or vaginal discharge. This is a new problem. The current episode started in the past 7 days. The problem occurs intermittently (2episodes, 3days apart). The problem has been unchanged. The patient is experiencing no pain. She is not pregnant. Associated symptoms include diarrhea. Pertinent negatives include no abdominal pain, anorexia, chills, constipation, dysuria, fever, frequency, nausea or vomiting. The vaginal discharge was bloody. She has not been passing clots. She has not been passing tissue. Nothing aggravates the symptoms. She has tried nothing for the symptoms. She is not sexually active.  daughter reports blood during perineal care and on incontinent pad. She describes to be size of half dollar bill.  Daughter reports increased difficulty swallowing solid food. No problems with liquids. No coughing, no fever, no SOB.  Outpatient Medications Prior to Visit  Medication Sig Dispense Refill  . acetaminophen (TYLENOL) 325  MG tablet Take 1-2 tablets (325-650 mg total) by mouth every 4 (four) hours as needed for mild pain.    [name] albuterol (PROVENTIL HFA) 108 (90 Base) MCG/ACT inhaler Inhale 2 puffs into the lungs every 6 (six) hours as needed for wheezing or shortness of breath. 1 Inhaler 1  . diclofenac sodium (VOLTAREN) 1 % GEL APPLY (2GMS) TOPICALLY THREE TIMES DAILY. (Patient taking differently: APPLY (2GMS) TOPICALLY THREE TIMES DAILY AS NEEDED FOR PAIN) 300 g 3  . gabapentin (NEURONTIN) 100 MG capsule Take 1 capsule (100 mg total) by mouth 3 (three) times daily. (Patient taking differently: Take 100 mg by mouth 2 (two) times daily. ) 90 capsule 5  . hydrocortisone 1 % ointment APPLY 1 APPLICATION TOPICALLY 2 (TWO) TIMES DAILY. 28.35 g 0  . hydroxyurea (HYDREA) 500 MG capsule Take 2 capsules (1,000 mg total) by mouth daily. May take with food to minimize GI side effects. (Patient taking differently: Take by mouth. Take 1 tab daily) 60 capsule 9  . methimazole (TAPAZOLE) 5 MG tablet TAKE 1 TABLET BY MOUTH TWICE WEEKLY 12 tablet 2  . pantoprazole sodium (PROTONIX) 40 mg/20 mL PACK Take 20 mLs (40 mg total) by mouth daily. 30 each 5  . polyethylene glycol (MIRALAX) packet Take 17 g by mouth 2 (two) times daily. (Patient taking differently: Take 17 g by mouth daily as needed. ) 14 each 0  . Probiotic Product (ALIGN) 4 MG CAPS Take 1 capsule by mouth daily.     . propranolol (INDERAL) 10 MG tablet TAKE 1 TABLET TWICE A DAY 60 tablet 5  . terconazole (TERAZOL 7) 0.4 % vaginal cream Place 1 applicator vaginally at bedtime.  90 g 3  . tizanidine (ZANAFLEX) 2 MG capsule Take 1 capsule (2 mg total) by mouth at bedtime. 30 capsule 1  . traMADol (ULTRAM) 50 MG tablet TAKE 1 TO 2 TABLETS BY MOUTH 3 TIMES A DAY AS NEEDED 180 tablet 1  . traZODone (DESYREL) 50 MG tablet Take 1 tablet (50 mg total) by mouth at bedtime as needed. for sleep 30 tablet 5  . warfarin (COUMADIN) 4 MG tablet TAKE AS DIRECTED BY ANTICOAGULATION CLINIC. 40  tablet 3  . fluconazole (DIFLUCAN) 150 MG tablet      No facility-administered medications prior to visit.     ROS See HPI  Objective:  BP 110/60   Pulse 83   Temp 98.1 F (36.7 C) (Oral)   Resp 12   SpO2 93%   BP Readings from Last 3 Encounters:  01/17/16 110/60  12/31/15 114/78  12/27/15 125/71    Wt Readings from Last 3 Encounters:  12/31/15 186 lb (84.4 kg)  10/25/15 176 lb (79.8 kg)  09/26/15 173 lb 1.6 oz (78.5 kg)    Physical Exam  Constitutional: No distress.  Cardiovascular: Normal rate and normal heart sounds.   Pulmonary/Chest: Effort normal. She has no wheezes. She has no rales.  Diminished breath sounds due to shallow breathing  Abdominal: Soft. Bowel sounds are normal. She exhibits no distension. There is no tenderness.  Genitourinary:  Genitourinary Comments: Deferred pelvic exam by daughter. Patient unable to stand nor get on exam table.  Neurological: She is alert.    Lab Results  Component Value Date   WBC 4.8 01/17/2016   HGB 13.8 01/17/2016   HCT 41.6 01/17/2016   PLT 422.0 (H) 01/17/2016   GLUCOSE 81 01/17/2016   CHOL 95 03/09/2014   TRIG 54 03/09/2014   HDL 31 (L) 03/09/2014   LDLCALC 53 03/09/2014   ALT 49 (H) 07/19/2015   AST 16 07/19/2015   NA 141 01/17/2016   K 4.1 01/17/2016   CL 107 01/17/2016   CREATININE 0.50 01/17/2016   BUN 13 01/17/2016   CO2 31 01/17/2016   TSH 1.25 12/03/2015   INR 4.0 01/17/2016   HGBA1C 5.4 03/09/2014    No results found.  Assessment & Plan:   [name] was seen today for acute visit.  Diagnoses and all orders for this visit:  Abnormal vaginal bleeding -     CBC w/Diff; Future  Diarrhea, unspecified type -     CBC w/Diff; Future -     Clostridium difficile EIA; Future -     Stool culture; Future -     Basic metabolic panel; Future  Oropharyngeal dysphagia -     Ambulatory referral to Speech Therapy   I am having Sandy Barrett maintain her ALIGN, acetaminophen, terconazole,  albuterol, diclofenac sodium, polyethylene glycol, pantoprazole sodium, traZODone, gabapentin, hydroxyurea, hydrocortisone, methimazole, traMADol, propranolol, warfarin, fluconazole, and tizanidine.  No orders of the defined types were placed in this encounter.  Recent Results (from the past 2160 hour(s))  Protime-INR     Status: Abnormal   Collection Time: 10/25/15  2:44 PM  Result Value Ref Range   Protime 33.6 (H) 10.6 - 13.4 Seconds   INR 2.80 2.00 - 3.50    Comment: INR is useful only to assess adequacy of anticoagulation with coumadin when comparing results from different labs. It should not be used to estimate bleeding risk or presence/abscense of coagulopathy in patients not on coumadin. Expected INR ranges for  nontherapeutic patients is 0.88 - 1.12.  Lovenox No   CBC with Differential/Platelet     Status: Abnormal   Collection Time: 10/25/15  2:44 PM  Result Value Ref Range   WBC 3.8 (L) 3.9 - 10.3 10e3/uL   NEUT# 1.9 1.5 - 6.5 10e3/uL   HGB 13.5 11.6 - 15.9 g/dL   HCT 40.6 34.8 - 46.6 %   Platelets 268 145 - 400 10e3/uL   MCV 109.4 (H) 79.5 - 101.0 fL   MCH 36.4 (H) 25.1 - 34.0 pg   MCHC 33.3 31.5 - 36.0 g/dL   RBC 3.71 3.70 - 5.45 10e6/uL   RDW 20.0 (H) 11.2 - 14.5 %   lymph# 1.4 0.9 - 3.3 10e3/uL   MONO# 0.4 0.1 - 0.9 10e3/uL   Eosinophils Absolute 0.1 0.0 - 0.5 10e3/uL   Basophils Absolute 0.1 0.0 - 0.1 10e3/uL   NEUT% 50.6 38.4 - 76.8 %   LYMPH% 35.3 14.0 - 49.7 %   MONO% 11.0 0.0 - 14.0 %   EOS% 1.8 0.0 - 7.0 %   BASO% 1.3 0.0 - 2.0 %   nRBC 0 0 - 0 %  T4, free     Status: None   Collection Time: 12/03/15  1:57 PM  Result Value Ref Range   Free T4 0.94 0.60 - 1.60 ng/dL    Comment: Specimens from patients who are undergoing biotin therapy and /or ingesting biotin supplements may contain high levels of biotin.  The higher biotin concentration in these specimens interferes with this Free T4 assay.  Specimens that contain high levels  of biotin may cause false  high results for this Free T4 assay.  Please interpret results in light of the total clinical presentation of the patient.    TSH     Status: None   Collection Time: 12/03/15  1:57 PM  Result Value Ref Range   TSH 1.25 0.35 - 4.50 uIU/mL  POCT INR     Status: None   Collection Time: 12/09/15 12:00 AM  Result Value Ref Range   INR 3.0   POCT INR     Status: None   Collection Time: 01/17/16 12:00 AM  Result Value Ref Range   INR 4.0   CBC w/Diff     Status: Abnormal   Collection Time: 01/17/16  4:49 PM  Result Value Ref Range   WBC 4.8 4.0 - 10.5 K/uL   RBC 3.72 (L) 3.87 - 5.11 Mil/uL   Hemoglobin 13.8 12.0 - 15.0 g/dL   HCT 41.6 36.0 - 46.0 %   MCV 111.6 Repeated and verified X2. (H) 78.0 - 100.0 fl   MCHC 33.2 30.0 - 36.0 g/dL   RDW 14.9 11.5 - 15.5 %   Platelets 422.0 (H) 150.0 - 400.0 K/uL   Neutrophils Relative % 65.4 43.0 - 77.0 %   Lymphocytes Relative 23.9 12.0 - 46.0 %   Monocytes Relative 8.1 3.0 - 12.0 %   Eosinophils Relative 2.0 0.0 - 5.0 %   Basophils Relative 0.6 0.0 - 3.0 %   Neutro Abs 3.1 1.4 - 7.7 K/uL   Lymphs Abs 1.1 0.7 - 4.0 K/uL   Monocytes Absolute 0.4 0.1 - 1.0 K/uL   Eosinophils Absolute 0.1 0.0 - 0.7 K/uL   Basophils Absolute 0.0 0.0 - 0.1 K/uL  Basic metabolic panel     Status: None   Collection Time: 01/17/16  4:49 PM  Result Value Ref Range   Sodium 141 135 - 145 mEq/L   Potassium 4.1 3.5 - 5.1 mEq/L  Chloride 107 96 - 112 mEq/L   CO2 31 19 - 32 mEq/L   Glucose, Bld 81 70 - 99 mg/dL   BUN 13 6 - 23 mg/dL   Creatinine, Ser 0.50 0.40 - 1.20 mg/dL   Calcium 8.7 8.4 - 10.5 mg/dL   GFR 152.13 >60.00 mL/min   Follow-up: Return if symptoms worsen or fail to improve.  Wilfred Lacy, NP

## 2016-01-17 NOTE — Patient Instructions (Addendum)
Normal cbc. Vaginal bleed was probably as results of coumadin use. INR today of 4.0. coumadin therapy adjusted. Return to office if  Vaginal bleeding increases. Consider Ct ABD/pelvis if symptoms persist. Aspiration Precautions, Adult Aspiration is the breathing in (inhalation) of a liquid or object into the lungs. Things that can be inhaled into the lungs include:  Food.  Any type of liquid, such as drinks or saliva.  Stomach contents, such as vomit or stomach acid. What are the signs of aspiration? Signs of aspiration include:  Coughing after swallowing food or liquids.  Clearing the throat often while eating.  Trouble breathing. This may include:  Breathing quickly.  Breathing very slowly.  Loud breathing.  Rumbling sounds from the lungs while breathing.  Coughing up phlegm (sputum) that:  Is yellow, tan, or green.  Has pieces of food in it.  Is bad-smelling.  Having a hoarse, barky cough.  Not being able to speak.  A hoarse voice.  Drooling while eating.  A feeling of fullness in the throat or a feeling that something is stuck in the throat.  Choking often.  Having a runny noise while eating.  Coughing when lying down or having to sit up quickly after lying down.  A change in skin color. The skin may look red or blue.  Fever.  Watery eyes.  Pain in the chest or back.  A pained look on the face. What are the complications of aspiration? Complications of aspiration include:  Losing weight because the person is not absorbing needed nutrients.  Loss of enjoyment and the social benefits of eating.  Choking.  Lung irritation, if someone aspirates acidic food or drinks.  Lung infection (pneumonia).  Collection of infected liquid (pus) in the lungs (lung abscess). In serious cases, death can occur. What can I do to prevent aspiration? Caring for someone who has a feeding tube If you are caring for someone who has a feeding tube who cannot  eat or drink safely through his or her mouth:  Keep the person in an upright position as much as possible.  Do not lay the person flat if he or she is getting continuous feedings. If you need to lay the person flat for any reason, turn the feeding pump off.  Check feeding tube residuals as told by your health care provider. Ask your health care provider what residual amount is too high. Caring for someone who can eat and drink safely by mouth If you are caring for someone who can eat and drink safely through his or her mouth:  Have the person sit in an upright position when eating food or drinking fluids. This can be done in two ways:  Have the person sit up in a chair.  If sitting in a chair is not possible, position the person in bed so he or she is upright.  Remind the person to eat slowly and chew well. Make sure the person is awake and alert while eating.  Do not distract the person. This is especially important for people with thinking or memory (cognitive) problems.  Allow foods to cool. Hot foods may be more difficult to swallow.  Provide small meals more frequently, instead of 3 large meals. This may reduce fatigue during eating.  Check the person's mouth thoroughly for leftover food after eating.  Keep the person sitting upright for 30-45 minutes after eating.  Do not serve food or drink during 2 hours or more before bedtime. General instructions Follow these general guidelines  to prevent aspiration in someone who can eat and drink safely by mouth:  Never put food or liquids in the mouth of a person who is not fully alert.  Feed small amounts of food. Do not force feed.  For a person who is on a diet for swallowing difficulty (dysphagia diet), follow the recommended food and drink consistency. For example, in dysphagia diet level 1, thicken liquids to pudding-like consistency.  Use as little water as possible when brushing the person's teeth or cleaning his or her  mouth.  Provide oral care before and after meals.  Use adaptive devices such as cut-out cups, straws, or utensils as told by the health care provider.  Crush pills and put them in soft food such as pudding or ice cream. Some pills should not be crushed. Check with the health care provider before crushing any medicine. Contact a health care provider if:  The person has a feeding tube, and the feeding tube residual amount is too high.  The person has a fever.  The person tries to avoid food or water, such as refusing to eat, drink, or be fed, or is eating less than normal.  The person may have aspirated food or liquid.  You notice warning signs, such as choking or coughing, when the person eats or drinks. Get help right away if:  The person has trouble breathing or starts to breathe quickly.  The person is breathing very slowly or stops breathing.  The person coughs a lot after eating or drinking.  The person has a long-lasting (chronic) cough.  The person coughs up thick, yellow, or tan sputum.  If someone is choking on food or an object, perform the Heimlich maneuver (abdominal thrusts).  The person has symptoms of pneumonia, such as:  Coughing a lot.  Coughing up mucus with a bad smell or blood in it.  Feeling short of breath.  Complaining of chest pain.  Sweating, fever, and chills.  Feeling tired.  Complaining of trouble breathing.  Wheezing.  The person cannot stop choking.  The person is unable to breathe, turns blue, faints, or seems confused. These symptoms may represent a serious problem that is an emergency. Do not wait to see if the symptoms will go away. Get medical help right away. Call your local emergency services (911 in the [place]  Summary  Aspiration is the breathing in (inhalation) of a liquid or object into the lungs. Things that can be inhaled into the lungs include food, liquids, saliva, or stomach contents.  Aspiration can cause  pneumonia or choking.  One sign of aspiration is coughing after swallowing food or liquids.  Contact a health care provider if you notice signs of aspiration. This information is not intended to replace advice given to you by your health care provider. Make sure you discuss any questions you have with your health care provider. Document Released: 01/24/2010 Document Revised: 09/19/2015 Document Reviewed: 09/19/2015 Elsevier Interactive Patient Education  2017 [employer].

## 2016-01-17 NOTE — Patient Instructions (Signed)
Pre visit review using our clinic review tool, if applicable. No additional management support is needed unless otherwise documented below in the visit note. 

## 2016-01-18 NOTE — Progress Notes (Signed)
I have reviewed and agree with the plan. 

## 2016-01-20 ENCOUNTER — Telehealth: Payer: Self-pay

## 2016-01-20 ENCOUNTER — Telehealth: Payer: Self-pay | Admitting: Emergency Medicine

## 2016-01-20 NOTE — Telephone Encounter (Signed)
This is not real typical. However, patients can get sedated to the point where they don't use there week side as much. I would advise stopping this medication

## 2016-01-20 NOTE — Telephone Encounter (Signed)
Rosela Domina (patients daughter) has called stating since mom has been talking tizanidine she has been having no control of her right side.[name] would like to know would Tizanidine cause her mom to be that relaxed? She would like to stop medication if that is okay. Please advise

## 2016-01-20 NOTE — Telephone Encounter (Signed)
Sandy Barrett was notified, She would like to know are there any alternative medication for her mom, if not she express her understanding. [name] was very thankful and appreciative for the call back/ quick response.

## 2016-01-20 NOTE — Telephone Encounter (Signed)
[name] called and asked that you return her call when you get a chance. Thanks.

## 2016-01-21 NOTE — Telephone Encounter (Signed)
Baclofen 5 mg 1 tablet 3 times per day, #90, no refills No other options other than this

## 2016-01-22 ENCOUNTER — Telehealth: Payer: Self-pay | Admitting: *Deleted

## 2016-01-22 NOTE — Telephone Encounter (Signed)
Called daughter to see if they will be able to come in for tomorrow's appts. Daughter states her mother had been having vaginal bleeding- saw PCP who adjusted her coumadin and thought she had a UTI. Reports no vaginal bleeding today, but has a ~ 3 inch bruise on abdomen- denies pain and is not aware of any injury. Instructed to call PCP to follow up. To call us back if she is not able to get in touch with PCP. Mother is in a wheelchair and difficult to maneuver especially in the snow. Will call us when she knows if she will be keeping appts.

## 2016-01-23 ENCOUNTER — Other Ambulatory Visit: Payer: No Typology Code available for payment source

## 2016-01-23 ENCOUNTER — Ambulatory Visit: Payer: No Typology Code available for payment source | Admitting: Hematology and Oncology

## 2016-01-23 ENCOUNTER — Telehealth: Payer: Self-pay | Admitting: *Deleted

## 2016-01-23 NOTE — Telephone Encounter (Signed)
Her appointment to see me is non-urgent OK to reschedule to 2 weeks I can see her on either 2/1 or 2/2  If this is acceptable to her, let me know and I will place scheduling msg

## 2016-01-23 NOTE — Telephone Encounter (Signed)
S/w pt's daughter,  [name].  Informed her we can r/s Dr. Cal[place] Dach appt today to 2/1 or 2/2.  She says they can come on 2/2 and requests afternoon appt...  Informed her to come at 1 pm for lab and 1:30 pm to see Dr. Alvy Bimler on 2/2 and she was ok with this date/time.   Scheduling message sent to reschedule.   Daughter asked if Dr. Alvy Bimler had anything to add about pt's bruising and bleeding?  Instructed daughter important to continue to f/u w/ PCP regarding coumadin and INR results. Let PCP know if any more bleeding or new bruising and make sure they continue to monitor pt's coumadin and INR.  She verbalized understanding.

## 2016-01-24 ENCOUNTER — Telehealth: Payer: Self-pay | Admitting: General Practice

## 2016-01-24 MED ORDER — BACLOFEN 10 MG PO TABS: 5.0000 mg | ORAL_TABLET | Freq: Three times a day (TID) | ORAL | 0 refills | Status: DC

## 2016-01-24 NOTE — Telephone Encounter (Signed)
I notified Mrs Sheerin daughter.  She is wanting her to take it bid because of the difficulty with pills and swallowing.  I explained it most likely will be a half tablet that the pharmacy will cut since the order is for 5 mg and only comes in 10 mg tablets. ( 10 mg, take 0.5  Tablet tid #45)  I advised her to ask the pharmacisit if it can be crushed.  If not it does come in liquid form but will have to be ordered that way if needed.

## 2016-01-24 NOTE — Telephone Encounter (Signed)
Patient's daughter called to report a 3 x 3 inch, purple bruise on patient's abdomen.  Daughter also reported a blood vessel that had burst in patient's eye.  I instructed patient to draw a circle around the bruise and report if it spreads outside of the circle.  I also instructed patient to give patient 1/2 dose of coumadin today and tomorrow and then to resume current dosage.  Instructed daughter to keep scheduled appointment on Friday, 1/26.  Also instructed patient to call office if symptoms worsen.  Daughter verbalized understanding.

## 2016-01-26 ENCOUNTER — Emergency Department (HOSPITAL_COMMUNITY)
Admission: EM | Admit: 2016-01-26 | Discharge: 2016-01-27 | Disposition: A | Discharge status: Home / Self Care | Payer: Medicare Other | Attending: Emergency Medicine | Admitting: Emergency Medicine

## 2016-01-26 ENCOUNTER — Encounter (HOSPITAL_COMMUNITY): Payer: Self-pay | Admitting: Emergency Medicine

## 2016-01-26 DIAGNOSIS — Z7901 Long term (current) use of anticoagulants: Secondary | ICD-10-CM | POA: Insufficient documentation

## 2016-01-26 DIAGNOSIS — R319 Hematuria, unspecified: Secondary | ICD-10-CM | POA: Insufficient documentation

## 2016-01-26 DIAGNOSIS — Y929 Unspecified place or not applicable: Secondary | ICD-10-CM | POA: Diagnosis not present

## 2016-01-26 DIAGNOSIS — I10 Essential (primary) hypertension: Secondary | ICD-10-CM | POA: Insufficient documentation

## 2016-01-26 DIAGNOSIS — S3991XA Unspecified injury of abdomen, initial encounter: Secondary | ICD-10-CM | POA: Diagnosis present

## 2016-01-26 DIAGNOSIS — Y999 Unspecified external cause status: Secondary | ICD-10-CM | POA: Diagnosis not present

## 2016-01-26 DIAGNOSIS — X58XXXA Exposure to other specified factors, initial encounter: Secondary | ICD-10-CM | POA: Diagnosis not present

## 2016-01-26 DIAGNOSIS — S301XXA Contusion of abdominal wall, initial encounter: Secondary | ICD-10-CM | POA: Diagnosis not present

## 2016-01-26 DIAGNOSIS — Y939 Activity, unspecified: Secondary | ICD-10-CM | POA: Diagnosis not present

## 2016-01-26 DIAGNOSIS — Z79899 Other long term (current) drug therapy: Secondary | ICD-10-CM | POA: Insufficient documentation

## 2016-01-26 DIAGNOSIS — N939 Abnormal uterine and vaginal bleeding, unspecified: Secondary | ICD-10-CM | POA: Insufficient documentation

## 2016-01-26 DIAGNOSIS — Z8673 Personal history of transient ischemic attack (TIA), and cerebral infarction without residual deficits: Secondary | ICD-10-CM | POA: Insufficient documentation

## 2016-01-26 DIAGNOSIS — R233 Spontaneous ecchymoses: Secondary | ICD-10-CM | POA: Diagnosis not present

## 2016-01-26 LAB — TYPE AND SCREEN
ABO/RH(D): A POS
Antibody Screen: NEGATIVE

## 2016-01-26 LAB — COMPREHENSIVE METABOLIC PANEL
ALT: 13 U/L — ABNORMAL LOW (ref 14–54)
AST: 16 U/L (ref 15–41)
Albumin: 3.4 g/dL — ABNORMAL LOW (ref 3.5–5.0)
Alkaline Phosphatase: 61 U/L (ref 38–126)
Anion gap: 7 (ref 5–15)
BUN: 13 mg/dL (ref 6–20)
CO2: 28 mmol/L (ref 22–32)
Calcium: 9.1 mg/dL (ref 8.9–10.3)
Chloride: 107 mmol/L (ref 101–111)
Creatinine, Ser: 0.51 mg/dL (ref 0.44–1.00)
GFR calc Af Amer: >60 mL/min (ref >60)
GFR calc non Af Amer: >60 mL/min (ref >60)
Glucose, Bld: 113 mg/dL — ABNORMAL HIGH (ref 65–99)
Potassium: 3.7 mmol/L (ref 3.5–5.1)
Sodium: 142 mmol/L (ref 135–145)
Total Bilirubin: 0.5 mg/dL (ref 0.3–1.2)
Total Protein: 6.6 g/dL (ref 6.5–8.1)

## 2016-01-26 LAB — PROTIME-INR
INR: 2.38
Prothrombin Time: 26.4 seconds — ABNORMAL HIGH (ref 11.4–15.2)

## 2016-01-26 LAB — CBC
HCT: 44.6 % (ref 36.0–46.0)
Hemoglobin: 14.4 g/dL (ref 12.0–15.0)
MCH: 36.6 pg — ABNORMAL HIGH (ref 26.0–34.0)
MCHC: 32.3 g/dL (ref 30.0–36.0)
MCV: 113.5 fL — ABNORMAL HIGH (ref 78.0–100.0)
Platelets: 453 10*3/uL — ABNORMAL HIGH (ref 150–400)
RBC: 3.93 MIL/uL (ref 3.87–5.11)
RDW: 14.2 % (ref 11.5–15.5)
WBC: 5.5 10*3/uL (ref 4.0–10.5)

## 2016-01-26 NOTE — ED Provider Notes (Signed)
[facility] DEPT Provider Note   CSN: [id number] Arrival date & time: 01/26/16  2034     History   Chief Complaint Chief Complaint  Patient presents with  . Vaginal Bleeding  . eye redness    HPI [name] Ore is a 83 y.o. female with a history of stroke with total right hemiparesis and aphasia at baseline presenting with vaginal spotting over the last 2 weeks on and off. Patient has also had watery diarrhea which has now resolved yesterday. She is on Coumadin and was seen for this issue on Friday the 12th at [facility]. She was advised to discontinue Coumadin for 2 days and resume normal dosing thereafter. They also wanted to get a stool sample which was not yet sent to them. Patient has history of recent antibiotic use (keflex from Jan 4th-8th for uti).Caregiver states that she was going to bring stool sample to their office tomorrow. She has also noticed left lower quadrant abdominal ecchymosis as well as blood injected right eye, which patient has had in the past and normally clears. Denies any evidence of discomfort, pain or change in mental status from baseline.  HPI  Past Medical History:  Diagnosis Date  . ARTHRITIS   . Arthritis   . Diverticulosis   . HYPERTENSION   . HYPERTHYROIDISM   . Hyperthyroidism    s/p I-131 ablation 03/2011 of multinod goiter  . INSOMNIA   . MIGRAINE HEADACHE   . OBESITY   . Posttraumatic stress disorder   . Pulmonary embolism ([place]) 03/2014   with DVT  . Stroke [facility]) 03/2014   dysarthria    Patient Active Problem List   Diagnosis Date Noted  . Excessive cerumen in ear canal, bilateral 12/31/2015  . Leg swelling 12/31/2015  . Discoloration of skin 09/26/2015  . Dermatitis 09/13/2015  . Essential thrombocytosis ([place]) 07/31/2015  . SVC (superior vena cava obstruction), chronic 07/20/2015  . Dysphagia 07/20/2015  . Elevated liver enzymes 07/20/2015  . Constipation 07/20/2015  . Thrombophlebitis of breast, right 06/18/2015  . Tracheal  deviation 06/06/2015  . Sensorineural hearing loss, bilateral, moderate-moderately severe 04/30/2015  . Abdominal wall lump 03/09/2015  . Frequent UTI 02/06/2015  . Primary osteoarthritis involving multiple joints 07/26/2014  . Pernicious anemia 07/26/2014  . Hearing loss 07/26/2014  . Encounter for therapeutic drug monitoring 07/17/2014  . Spastic hemiparesis affecting dominant side ([place]), right 05/31/2014  . DVT of lower extremity, bilateral ([place]) 03/14/2014  . Global aphasia 03/14/2014  . 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 Aphasia due to stroke ([place]) 03/13/2014  . Cerebral infarction due to embolism of left middle cerebral artery ([place])   . Stroke, embolic ([place]) 123456  . History of pulmonary embolism 03/08/2014  . Back pain 08/29/2013  . Primary localized osteoarthrosis, lower leg 03/06/2013  . IBS (irritable bowel syndrome)   . Multinodular goiter 01/13/2011  . Hyperthyroidism 11/11/2009  . Insomnia 07/03/2009  . POSTTRAUMATIC STRESS DISORDER 08/22/2008  . OBESITY 04/21/2008  . Osteoarthritis 04/21/2008  . MIGRAINE HEADACHE 04/20/2008  . Essential hypertension 04/20/2008    Past Surgical History:  Procedure Laterality Date  . ABDOMINAL HYSTERECTOMY  1976  . BREAST SURGERY     biopsy  . RADIOLOGY WITH ANESTHESIA Left 03/08/2014   Procedure: RADIOLOGY WITH ANESTHESIA;  Surgeon: Rob Hickman, MD;  Location: [facility];  Service: Radiology;  Laterality: Left;  . TONSILLECTOMY    . TOTAL HIP ARTHROPLASTY  1998    right    OB History  No data available       Home Medications    Prior to Admission medications   Medication Sig Start Date End Date Taking? Authorizing Provider  acetaminophen (TYLENOL) 325 MG tablet Take 1-2 tablets (325-650 mg total) by mouth every 4 (four) hours as needed for mild pain. 04/05/14   [name], PA-C  albuterol (PROVENTIL HFA) 108 (90 Base) MCG/ACT inhaler Inhale 2 puffs into the lungs every 6 (six) hours as needed for  wheezing or shortness of breath. 05/15/15   Burnard Hawthorne, FNP  baclofen (LIORESAL) 10 MG tablet Take 0.5 tablets (5 mg total) by mouth 3 (three) times daily. 01/24/16   Charlett Blake, MD  diclofenac sodium (VOLTAREN) 1 % GEL APPLY (2GMS) TOPICALLY THREE TIMES DAILY. Patient taking differently: APPLY (2GMS) TOPICALLY THREE TIMES DAILY AS NEEDED FOR PAIN 06/11/15   [name], MD  fluconazole (DIFLUCAN) 150 MG tablet  11/14/15   Historical Provider, MD  gabapentin (NEURONTIN) 100 MG capsule Take 1 capsule (100 mg total) by mouth 3 (three) times daily. Patient taking differently: Take 100 mg by mouth 2 (two) times daily.  08/12/15   [name], MD  hydrocortisone 1 % ointment APPLY 1 APPLICATION TOPICALLY 2 (TWO) TIMES DAILY. 09/24/15   Heath Lark, MD  hydroxyurea (HYDREA) 500 MG capsule Take 2 capsules (1,000 mg total) by mouth daily. May take with food to minimize GI side effects. Patient taking differently: Take by mouth. Take 1 tab daily 08/23/15   Heath Lark, MD  methimazole (TAPAZOLE) 5 MG tablet TAKE 1 TABLET BY MOUTH TWICE WEEKLY 11/10/15   Renato Shin, MD  pantoprazole sodium (PROTONIX) 40 mg/20 mL PACK Take 20 mLs (40 mg total) by mouth daily. 07/24/15   [name], MD  polyethylene glycol (MIRALAX) packet Take 17 g by mouth 2 (two) times daily. Patient taking differently: Take 17 g by mouth daily as needed.  06/27/15   Merryl Hacker, MD  Probiotic Product (ALIGN) 4 MG CAPS Take 1 capsule by mouth daily.     Historical Provider, MD  propranolol (INDERAL) 10 MG tablet TAKE 1 TABLET TWICE A DAY 12/02/15   [name], MD  terconazole (TERAZOL 7) 0.4 % vaginal cream Place 1 applicator vaginally at bedtime. 04/18/15   Lavonia Drafts, MD  traMADol (ULTRAM) 50 MG tablet TAKE 1 TO 2 TABLETS BY MOUTH 3 TIMES A DAY AS NEEDED 11/19/15   [name], MD  traZODone (DESYREL) 50 MG tablet Take 1 tablet (50 mg total) by mouth at bedtime as needed. for sleep 08/12/15   [name],  MD  warfarin (COUMADIN) 4 MG tablet TAKE AS DIRECTED BY ANTICOAGULATION CLINIC. 12/27/15   [name], MD    Family History Family History  Problem Relation Age of Onset  . Asthma Mother   . Asthma Father   . Prostate cancer Father   . Stroke Brother   . Breast cancer Sister   . Stomach cancer Sister   . Thyroid disease Neg Hx     Social History Social History  Substance Use Topics  . Smoking status: Never Smoker  . Smokeless tobacco: Never Used  . Alcohol use No     Allergies   Patient has no known allergies.   Review of Systems Review of Systems  Constitutional: Negative for chills and fever.  HENT: Negative for ear pain and sore throat.   Eyes: Positive for redness. Negative for pain.  Respiratory: Negative for  cough and shortness of breath.   Cardiovascular: Negative for chest pain and palpitations.  Gastrointestinal: Negative for abdominal distention, abdominal pain, nausea and vomiting.       Patient has had watery diarrhea that has not resolved. Caregiver did not believe the blood was related to the diarrhea  Genitourinary: Positive for hematuria and vaginal bleeding. Negative for difficulty urinating and dysuria.       Spotting was noted when changing her caregiver is uncertain whether it is related to her urine or vaginal bleeding.  Musculoskeletal: Negative for back pain and neck pain.  Skin: Negative for color change, pallor and rash.  Neurological: Positive for speech difficulty and weakness. Negative for seizures and syncope.       Patient has right-sided paralysis and aphasia at baseline. She cannot follow commands.  All other systems reviewed and are negative.    Physical Exam Updated Vital Signs BP 140/74   Pulse 91   Temp 97.9 F (36.6 C) (Oral)   Resp 16   SpO2 95%   Physical Exam  Constitutional: She appears well-developed and well-nourished. No distress.  Patient is afebrile, nontoxic appearing sitting comfortably in bed in no acute  distress.  HENT:  Head: Normocephalic and atraumatic.  Eyes: Conjunctivae are normal. Pupils are equal, round, and reactive to light.  Patient's right sclerae both injected. Pupils are round equal and reactive. No hyphema   Neck: Neck supple.  Cardiovascular: Normal rate, regular rhythm and normal heart sounds.   No murmur heard. Pulmonary/Chest: Effort normal and breath sounds normal. No respiratory distress. She has no wheezes. She has no rales. She exhibits no tenderness.  Abdominal: Soft. Bowel sounds are normal. She exhibits no distension and no mass. There is no tenderness. There is no rebound and no guarding.  Genitourinary: Vagina normal. No vaginal discharge found.  Musculoskeletal: She exhibits no edema.  Neurological: She is alert.  Patient with right hemiparalysis at baseline  Skin: Skin is warm and dry. She is not diaphoretic. No pallor.  Ecchymosis of her LLQ  Psychiatric: She has a normal mood and affect.  Nursing note and vitals reviewed.    ED Treatments / Results  Labs (all labs ordered are listed, but only abnormal results are displayed) Labs Reviewed  COMPREHENSIVE METABOLIC PANEL - Abnormal; Notable for the following:       Result Value   Glucose, Bld 113 (*)    Albumin 3.4 (*)    ALT 13 (*)    All other components within normal limits  CBC - Abnormal; Notable for the following:    MCV 113.5 (*)    MCH 36.6 (*)    Platelets 453 (*)    All other components within normal limits  PROTIME-INR - Abnormal; Notable for the following:    Prothrombin Time 26.4 (*)    All other components within normal limits  URINALYSIS, ROUTINE W REFLEX MICROSCOPIC  TYPE AND SCREEN  ABO/RH    EKG  EKG Interpretation None       Radiology No results found.  Procedures Procedures (including critical care time)  Medications Ordered in ED Medications - No data to display   Initial Impression / Assessment and Plan / ED Course  I have reviewed the triage vital signs  and the nursing notes.  Pertinent labs & imaging results that were available during my care of the patient were reviewed by me and considered in my medical decision making (see chart for details).    83 y/o presenting with  intermittent vaginal spotting per caregiver. She has been talking to her primary care who has been advising modifications in her coumadin. She is no longer experiencing watery diarrhea for the past 2 days. Reassuring exam. Patient is afebrile, non-toxic appearing in no acute distress and has been comfortable and hemodynamically stable while in the Ed. Labs unremarkable. INR 2.38 External genitalia exam was unremarkable with no obvious bleeding from the vaginal opening.  Caregiver explains that patient was strongly refusing a pelvic exam at this time. She asked that we did the in&out cath for urinalysis and she will be bringing the requested stool sample to office tomorrow and follow up with GYN if bleeding is indeed vaginal bleeding.   Strongly encouraged follow up with Gyn for any vaginal bleeding. Explained that she may need a biopsy for uterine malignancy if she is experiencing post-menopausal bleeding. Caregiver clearly understood and will be follow up outpatient.   Discharge home with close follow up with PCP and GYN. Discussed strict return precautions. Caregiver was advised to return to the emergency department if experiencing any worsening of symptoms. Caregiver understood instructions and agreed with discharge plan.  Patient was discussed with Dr. Roxanne Mins who also has seen patient and agrees with assessment and plan.  Patient care was transferred to [facility] at end of shift pending urine analysis. Anticipate discharge home with previously said plan +/- treatment for uti if positive or follow up with urology if hematuria.  Final Clinical Impressions(s) / ED Diagnoses   Final diagnoses:  Vaginal spotting    New Prescriptions New Prescriptions   No  medications on file     Dossie Der [id number]    Delora Fuel, MD [id number] [id number]

## 2016-01-26 NOTE — ED Triage Notes (Signed)
C/o vaginal bleeding x 2 weeks, bruise to L abd x 1 week (no known injury), and R eye redness since Friday.  Pt seen by PCP over a week ago for labs.  Advised to skip coumadin for 2 days and then take normal dosage.  Denies pain.

## 2016-01-27 DIAGNOSIS — S301XXA Contusion of abdominal wall, initial encounter: Secondary | ICD-10-CM | POA: Diagnosis not present

## 2016-01-27 LAB — URINALYSIS, ROUTINE W REFLEX MICROSCOPIC
Bilirubin Urine: NEGATIVE
Glucose, UA: NEGATIVE mg/dL
Ketones, ur: NEGATIVE mg/dL
Nitrite: NEGATIVE
Protein, ur: 30 mg/dL — AB
Specific Gravity, Urine: 1.019 (ref 1.005–1.030)
Squamous Epithelial / LPF: NONE SEEN
pH: 6 (ref 5.0–8.0)

## 2016-01-27 LAB — ABO/RH: ABO/RH(D): A POS

## 2016-01-27 NOTE — Discharge Instructions (Addendum)
There was blood noted in the urine without evidence of infection. Please follow up with urology on this matter. Call the number provided to set up an appointment.  Follow up with OB/GYN for further evaluation of any vaginal bleeding.  Please return to the emergency department in the meantime if she experiences any change in mental status, pallor, shortness of breath or any other concerning symptoms.

## 2016-01-27 NOTE — ED Provider Notes (Signed)
Took patient care handoff report from Avie Echevaria, [place]. Plan: Await UA results and treat accordingly. Discharge with urology follow-up if hematuria. Patient is to follow-up with OB/GYN for pelvic exam and evaluation of possible vaginal bleeding.   Results for orders placed or performed during the hospital encounter of 01/26/16  Comprehensive metabolic panel  Result Value Ref Range   Sodium 142 135 - 145 mmol/L   Potassium 3.7 3.5 - 5.1 mmol/L   Chloride 107 101 - 111 mmol/L   CO2 28 22 - 32 mmol/L   Glucose, Bld 113 (H) 65 - 99 mg/dL   BUN 13 6 - 20 mg/dL   Creatinine, Ser 0.51 0.44 - 1.00 mg/dL   Calcium 9.1 8.9 - 10.3 mg/dL   Total Protein 6.6 6.5 - 8.1 g/dL   Albumin 3.4 (L) 3.5 - 5.0 g/dL   AST 16 15 - 41 U/L   ALT 13 (L) 14 - 54 U/L   Alkaline Phosphatase 61 38 - 126 U/L   Total Bilirubin 0.5 0.3 - 1.2 mg/dL   GFR calc non Af Amer >60 >60 mL/min   GFR calc Af Amer >60 >60 mL/min   Anion gap 7 5 - 15  CBC  Result Value Ref Range   WBC 5.5 4.0 - 10.5 K/uL   RBC 3.93 3.87 - 5.11 MIL/uL   Hemoglobin 14.4 12.0 - 15.0 g/dL   HCT 44.6 36.0 - 46.0 %   MCV 113.5 (H) 78.0 - 100.0 fL   MCH 36.6 (H) 26.0 - 34.0 pg   MCHC 32.3 30.0 - 36.0 g/dL   RDW 14.2 11.5 - 15.5 %   Platelets 453 (H) 150 - 400 K/uL  Protime-INR - (order if Patient is taking Coumadin / Warfarin)  Result Value Ref Range   Prothrombin Time 26.4 (H) 11.4 - 15.2 seconds   INR 2.38   Urinalysis, Routine w reflex microscopic  Result Value Ref Range   Color, Urine AMBER (A) YELLOW   APPearance HAZY (A) CLEAR   Specific Gravity, Urine 1.019 1.005 - 1.030   pH 6.0 5.0 - 8.0   Glucose, UA NEGATIVE NEGATIVE mg/dL   Hgb urine dipstick LARGE (A) NEGATIVE   Bilirubin Urine NEGATIVE NEGATIVE   Ketones, ur NEGATIVE NEGATIVE mg/dL   Protein, ur 30 (A) NEGATIVE mg/dL   Nitrite NEGATIVE NEGATIVE   Leukocytes, UA SMALL (A) NEGATIVE   RBC / HPF 6-30 0 - 5 RBC/hpf   WBC, UA 6-30 0 - 5 WBC/hpf   Bacteria, UA RARE (A)  NONE SEEN   Squamous Epithelial / LPF NONE SEEN NONE SEEN   Mucous PRESENT   Type and screen [facility]  Result Value Ref Range   ABO/RH(D) A POS    Antibody Screen NEG    Sample Expiration 01/29/2016   ABO/Rh  Result Value Ref Range   ABO/RH(D) A POS    No results found.    First UA was canceled due to insufficient quantity. When UA resulted, hematuria noted. Urology follow-up recommended. Patient follow-up with OB/GYN for any vaginal bleeding. Results and return precautions discussed. Patient's family voiced frustration with the delay in obtain the patient's urine results. All questions were answered and time was taken to help the patient and her family understand the delays and wait time factors involved in her care this evening, as well as explaining the meaning of her results.    Vitals:   01/26/16 2042 01/26/16 2315 01/27/16 0518  BP: 135/75 140/74 (!) 148/108  Pulse: 94 91 88  Resp: 16  18  Temp: 97.9 F (36.6 C)    TempSrc: Oral    SpO2: 96% 95% 95%      Lorayne Bender, PA-C [id number]    Delora Fuel, MD [id number]

## 2016-01-28 ENCOUNTER — Ambulatory Visit (INDEPENDENT_AMBULATORY_CARE_PROVIDER_SITE_OTHER): Payer: Medicare Other | Admitting: Internal Medicine

## 2016-01-28 ENCOUNTER — Encounter: Payer: Self-pay | Admitting: Internal Medicine

## 2016-01-28 VITALS — BP 104/80 | HR 81 | Temp 97.9°F | Resp 16

## 2016-01-28 DIAGNOSIS — N939 Abnormal uterine and vaginal bleeding, unspecified: Secondary | ICD-10-CM | POA: Diagnosis not present

## 2016-01-28 DIAGNOSIS — R791 Abnormal coagulation profile: Secondary | ICD-10-CM

## 2016-01-28 DIAGNOSIS — R310 Gross hematuria: Secondary | ICD-10-CM

## 2016-01-28 DIAGNOSIS — R58 Hemorrhage, not elsewhere classified: Secondary | ICD-10-CM | POA: Insufficient documentation

## 2016-01-28 DIAGNOSIS — H1131 Conjunctival hemorrhage, right eye: Secondary | ICD-10-CM

## 2016-01-28 NOTE — Progress Notes (Signed)
Subjective:    Patient ID: Sandy Barrett, female    DOB: 1932/08/21, 83 y.o.   MRN: [id number]  HPI She went to the ED on 01/26/16 for conjunctival hemorrhage, bruise on left side of abdomen, and bleeding from vaginal or rectal area.  Her INR was supratherapeutic and her other blood work was fairly unremarkable.  Her urinalysis did show blood, but it could be a contaminate from vaginal bleeding.  A pelvic exam was not performed due to logistics of getting her positioned for an exam.  She was advised to follow up with gyn and urology.   Her bleeding has decreased from the vaginal/urethra area, but she is still spotting.  Her daughter feels certain the bleeding is not rectal.  She can not tell where the bleeding is coming from - vagina vs urethra.  She has an appointment with urology tomorrow.  She has not schedule with gyn yet, her daughter wanted to know if she should have this done.   Her right subconjunctival hemorrhage has improved.  There is no pain involved.   Her left abdominal bruise has improved and it non tender.    There has been on additional bruising.   Her daughter wanted to discuss changing her from warfarin to xarelto or eliquis.    Medications and allergies reviewed with patient and updated if appropriate.  Patient Active Problem List   Diagnosis Date Noted  . Excessive cerumen in ear canal, bilateral 12/31/2015  . Leg swelling 12/31/2015  . Discoloration of skin 09/26/2015  . Dermatitis 09/13/2015  . Essential thrombocytosis ([place]) 07/31/2015  . SVC (superior vena cava obstruction), chronic 07/20/2015  . Dysphagia 07/20/2015  . Elevated liver enzymes 07/20/2015  . Constipation 07/20/2015  . Thrombophlebitis of breast, right 06/18/2015  . Tracheal deviation 06/06/2015  . Sensorineural hearing loss, bilateral, moderate-moderately severe 04/30/2015  . Abdominal wall lump 03/09/2015  . Frequent UTI 02/06/2015  . Primary osteoarthritis involving multiple joints  07/26/2014  . Pernicious anemia 07/26/2014  . Hearing loss 07/26/2014  . Encounter for therapeutic drug monitoring 07/17/2014  . Spastic hemiparesis affecting dominant side ([place]), right 05/31/2014  . DVT of lower extremity, bilateral ([place]) 03/14/2014  . Global aphasia 03/14/2014  . 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 Aphasia due to stroke ([place]) 03/13/2014  . Cerebral infarction due to embolism of left middle cerebral artery ([place])   . Stroke, embolic ([place]) [id number]  . History of pulmonary embolism 03/08/2014  . Back pain 08/29/2013  . Primary localized osteoarthrosis, lower leg 03/06/2013  . IBS (irritable bowel syndrome)   . Multinodular goiter 01/13/2011  . Hyperthyroidism 11/11/2009  . Insomnia 07/03/2009  . POSTTRAUMATIC STRESS DISORDER 08/22/2008  . OBESITY 04/21/2008  . Osteoarthritis 04/21/2008  . MIGRAINE HEADACHE 04/20/2008  . Essential hypertension 04/20/2008    Current Outpatient Prescriptions on File Prior to Visit  Medication Sig Dispense Refill  . acetaminophen (TYLENOL) 325 MG tablet Take 1-2 tablets (325-650 mg total) by mouth every 4 (four) hours as needed for mild pain.    [name] albuterol (PROVENTIL HFA) 108 (90 Base) MCG/ACT inhaler Inhale 2 puffs into the lungs every 6 (six) hours as needed for wheezing or shortness of breath. 1 Inhaler 1  . baclofen (LIORESAL) 10 MG tablet Take 0.5 tablets (5 mg total) by mouth 3 (three) times daily. 45 each 0  . diclofenac sodium (VOLTAREN) 1 % GEL APPLY (2GMS) TOPICALLY THREE TIMES DAILY. (Patient taking differently: APPLY (2GMS) TOPICALLY THREE TIMES DAILY AS NEEDED FOR  PAIN) 300 g 3  . fluconazole (DIFLUCAN) 150 MG tablet     . gabapentin (NEURONTIN) 100 MG capsule Take 1 capsule (100 mg total) by mouth 3 (three) times daily. (Patient taking differently: Take 100 mg by mouth 2 (two) times daily. ) 90 capsule 5  . hydrocortisone 1 % ointment APPLY 1 APPLICATION TOPICALLY 2 (TWO) TIMES DAILY. 28.35 g 0  . hydroxyurea  (HYDREA) 500 MG capsule Take 2 capsules (1,000 mg total) by mouth daily. May take with food to minimize GI side effects. (Patient taking differently: Take by mouth. Take 1 tab daily) 60 capsule 9  . methimazole (TAPAZOLE) 5 MG tablet TAKE 1 TABLET BY MOUTH TWICE WEEKLY 12 tablet 2  . pantoprazole sodium (PROTONIX) 40 mg/20 mL PACK Take 20 mLs (40 mg total) by mouth daily. 30 each 5  . polyethylene glycol (MIRALAX) packet Take 17 g by mouth 2 (two) times daily. (Patient taking differently: Take 17 g by mouth daily as needed. ) 14 each 0  . Probiotic Product (ALIGN) 4 MG CAPS Take 1 capsule by mouth daily.     . propranolol (INDERAL) 10 MG tablet TAKE 1 TABLET TWICE A DAY 60 tablet 5  . terconazole (TERAZOL 7) 0.4 % vaginal cream Place 1 applicator vaginally at bedtime. 90 g 3  . traMADol (ULTRAM) 50 MG tablet TAKE 1 TO 2 TABLETS BY MOUTH 3 TIMES A DAY AS NEEDED 180 tablet 1  . traZODone (DESYREL) 50 MG tablet Take 1 tablet (50 mg total) by mouth at bedtime as needed. for sleep 30 tablet 5  . warfarin (COUMADIN) 4 MG tablet TAKE AS DIRECTED BY ANTICOAGULATION CLINIC. 40 tablet 3   No current facility-administered medications on file prior to visit.     Past Medical History:  Diagnosis Date  . ARTHRITIS   . Arthritis   . Diverticulosis   . HYPERTENSION   . HYPERTHYROIDISM   . Hyperthyroidism    s/p I-131 ablation 03/2011 of multinod goiter  . INSOMNIA   . MIGRAINE HEADACHE   . OBESITY   . Posttraumatic stress disorder   . Pulmonary embolism ([place]) 03/2014   with DVT  . Stroke [facility]) 03/2014   dysarthria    Past Surgical History:  Procedure Laterality Date  . ABDOMINAL HYSTERECTOMY  1976  . BREAST SURGERY     biopsy  . RADIOLOGY WITH ANESTHESIA Left 03/08/2014   Procedure: RADIOLOGY WITH ANESTHESIA;  Surgeon: Rob Hickman, MD;  Location: [place];  Service: Radiology;  Laterality: Left;  . TONSILLECTOMY    . TOTAL HIP ARTHROPLASTY  1998    right    Social History   Social  History  . Marital status: Widowed    Spouse name: N/A  . Number of children: 2  . Years of education: 14   Occupational History  . retired     Restaurant manager, fast food   Social History Main Topics  . Smoking status: Never Smoker  . Smokeless tobacco: Never Used  . Alcohol use No  . Drug use: No  . Sexual activity: No   Other Topics Concern  . Not on file   Social History Narrative   Widowed, lived alone prior to CVA 03/2014   SNF at [facility], then home 07/2014 with dtr   Right handed   Caffeine use- occasionally drinks tea    Family History  Problem Relation Age of Onset  . Asthma Mother   . Asthma Father   . Prostate cancer Father   .  Stroke Brother   . Breast cancer Sister   . Stomach cancer Sister   . Thyroid disease Neg Hx     Review of Systems Unable to obtain    Objective:   Vitals:   01/28/16 1636  BP: 104/80  Pulse: 81  Resp: 16  Temp: 97.9 F (36.6 C)   There were no vitals filed for this visit. There is no height or weight on file to calculate BMI.  Wt Readings from Last 3 Encounters:  12/31/15 186 lb (84.4 kg)  10/25/15 176 lb (79.8 kg)  09/26/15 173 lb 1.6 oz (78.5 kg)     Physical Exam  Constitutional: She appears well-developed and well-nourished. No distress.  Eyes: Right eye exhibits no discharge. No scleral icterus.  Right conjunctival hemorrhage - improved per daughter  Abdominal: Soft. She exhibits no distension. There is no tenderness. There is no rebound.  Genitourinary:  Genitourinary Comments: deferred  Skin: She is not diaphoretic.  ecchymosis left side of abdomen - non tender, no swelling          Assessment & Plan:   See Problem List for Assessment and Plan of chronic medical problems.

## 2016-01-28 NOTE — Assessment & Plan Note (Addendum)
Has had some variability in INR associated with bleeding in more than one spot Discussed changing from warfarin to eliquis/xarelto -- discussed risks/benefits of each Will check with Dr Alvy Bimler for her opinion

## 2016-01-28 NOTE — Assessment & Plan Note (Signed)
Left abdomen Non tender, no associated swelling improving

## 2016-01-28 NOTE — Patient Instructions (Addendum)
   Medications reviewed and updated.  No changes recommended at this time.     

## 2016-01-28 NOTE — Assessment & Plan Note (Signed)
Improving No pain or discomfort Reassured that it will resolved with time

## 2016-01-28 NOTE — Assessment & Plan Note (Signed)
?   Vaginal bleeding Sees urology tomorrow Depending on what they see - will need gyn evaluation - daughter understands

## 2016-01-28 NOTE — Progress Notes (Signed)
Pre visit review using our clinic review tool, if applicable. No additional management support is needed unless otherwise documented below in the visit note. 

## 2016-01-28 NOTE — Assessment & Plan Note (Signed)
?   Vaginal contaminate Will see urology tomorrow

## 2016-01-29 ENCOUNTER — Ambulatory Visit: Payer: Medicare Other | Admitting: Internal Medicine

## 2016-01-29 DIAGNOSIS — R310 Gross hematuria: Secondary | ICD-10-CM | POA: Diagnosis not present

## 2016-01-29 LAB — URINE CULTURE: Culture: >=100000 — AB

## 2016-01-30 ENCOUNTER — Telehealth: Payer: Self-pay | Admitting: Internal Medicine

## 2016-01-30 ENCOUNTER — Other Ambulatory Visit: Payer: Self-pay | Admitting: Internal Medicine

## 2016-01-30 ENCOUNTER — Telehealth (HOSPITAL_BASED_OUTPATIENT_CLINIC_OR_DEPARTMENT_OTHER): Payer: Self-pay

## 2016-01-30 DIAGNOSIS — R1310 Dysphagia, unspecified: Secondary | ICD-10-CM

## 2016-01-30 DIAGNOSIS — Z8673 Personal history of transient ischemic attack (TIA), and cerebral infarction without residual deficits: Secondary | ICD-10-CM

## 2016-01-30 NOTE — Telephone Encounter (Signed)
Post ED Visit - Positive Culture Follow-up: Successful Patient Follow-Up  Culture assessed and recommendations reviewed by: []  Elenor Quinones, Pharm.D. []  Heide Guile, Pharm.D., BCPS []  Parks Neptune, Pharm.D. []  Alycia Rossetti, Pharm.D., BCPS []  [place], Pharm.D., BCPS, AAHIVP []  Legrand Como, Pharm.D., BCPS, AAHIVP []  Milus Glazier, Pharm.D. []  Stephens November, Pharm.DStanford Breed, Pharm.D.  Positive urine culture  []  Patient discharged without antimicrobial prescription and treatment is now indicated [x]  Organism is resistant to prescribed ED discharge antimicrobial []  Patient with positive blood cultures  Changes discussed with ED provider: Domenic Moras PA New antibiotic prescription Cephalexin 500 mg po BID x 7 days Called to not called in  Contacted patient, date 01/30/16, time 44 Spoke w/pts daughter [name] and pt seen by Urology 1/24 and was given Rx for Keflex   Dortha Kern 01/30/2016, 6:38 PM

## 2016-01-30 NOTE — Progress Notes (Signed)
ordered

## 2016-01-30 NOTE — Progress Notes (Signed)
ED Antimicrobial Stewardship Positive Culture Follow Up   [name] is an 83 y.o. female who presented to [facility] on 01/26/2016 with a chief complaint of  Chief Complaint  Patient presents with  . Vaginal Bleeding  . eye redness    Recent Results (from the past 720 hour(s))  Urine culture     Status: Abnormal   Collection Time: 01/27/16  4:46 AM  Result Value Ref Range Status   Specimen Description URINE, CATHETERIZED  Final   Special Requests ADDED [id number]  Final   Culture >=100,000 COLONIES/mL ESCHERICHIA COLI (A)  Final   Report Status 01/29/2016 FINAL  Final   Organism ID, Bacteria ESCHERICHIA COLI (A)  Final      Susceptibility   Escherichia coli - MIC*    AMPICILLIN 4 SENSITIVE Sensitive     CEFAZOLIN <=4 SENSITIVE Sensitive     CEFTRIAXONE <=1 SENSITIVE Sensitive     CIPROFLOXACIN <=0.25 SENSITIVE Sensitive     GENTAMICIN <=1 SENSITIVE Sensitive     IMIPENEM <=0.25 SENSITIVE Sensitive     NITROFURANTOIN <=16 SENSITIVE Sensitive     TRIMETH/SULFA <=20 SENSITIVE Sensitive     AMPICILLIN/SULBACTAM <=2 SENSITIVE Sensitive     PIP/TAZO <=4 SENSITIVE Sensitive     Extended ESBL NEGATIVE Sensitive     * >=100,000 COLONIES/mL ESCHERICHIA COLI   [x]  Patient discharged originally without antimicrobial agent and treatment is now indicated  New antibiotic prescription: Call patient to see if she is having urinary symptoms and if she has seen urology yet. If symptoms: Start cephalexin 500mg  PO BID x 7 days  If no symptoms: no change  ED Provider: Domenic Moras, PA-C  Delane Ginger 01/30/2016, 9:21 AM Infectious Diseases Pharmacist Phone# [phone]

## 2016-01-30 NOTE — Telephone Encounter (Signed)
Talk to her daughter - let her know I discussed it with her oncologist and we can discontinue the warfarin and start eliquis 2.5 mg twice daily for prevent of future stroke.  If she is interested.   Stop the wafarin for two days then start the eliquis the next day at above dose.

## 2016-01-31 ENCOUNTER — Telehealth: Payer: Self-pay | Admitting: Internal Medicine

## 2016-01-31 ENCOUNTER — Ambulatory Visit (INDEPENDENT_AMBULATORY_CARE_PROVIDER_SITE_OTHER): Payer: Medicare Other | Admitting: General Practice

## 2016-01-31 DIAGNOSIS — IMO0002 Reserved for concepts with insufficient information to code with codable children: Secondary | ICD-10-CM

## 2016-01-31 DIAGNOSIS — Z5181 Encounter for therapeutic drug level monitoring: Secondary | ICD-10-CM

## 2016-01-31 DIAGNOSIS — Z993 Dependence on wheelchair: Secondary | ICD-10-CM

## 2016-01-31 DIAGNOSIS — I63312 Cerebral infarction due to thrombosis of left middle cerebral artery: Secondary | ICD-10-CM

## 2016-01-31 LAB — POCT INR: INR: 3.4

## 2016-01-31 NOTE — Telephone Encounter (Signed)
Pt request referral send to [facility] for in home PT to help her train to operate motorized wheelchair. Please help, fax # [phone]

## 2016-01-31 NOTE — Patient Instructions (Signed)
Pre visit review using our clinic review tool, if applicable. No additional management support is needed unless otherwise documented below in the visit note. 

## 2016-02-01 ENCOUNTER — Telehealth: Payer: Self-pay | Admitting: Internal Medicine

## 2016-02-03 DIAGNOSIS — Z993 Dependence on wheelchair: Secondary | ICD-10-CM | POA: Insufficient documentation

## 2016-02-03 NOTE — Telephone Encounter (Signed)
ordered

## 2016-02-03 NOTE — Telephone Encounter (Signed)
Spoke with pts daughter to inform. Dr Quay Burow would like to have pt see [name] this week to check INR. Please contact daughter to schedule appt.

## 2016-02-03 NOTE — Telephone Encounter (Signed)
Spoke with patient's daughter, [name].  INR will be checked tomorrow, 1/30.

## 2016-02-03 NOTE — Telephone Encounter (Signed)
Please advise 

## 2016-02-04 ENCOUNTER — Ambulatory Visit (INDEPENDENT_AMBULATORY_CARE_PROVIDER_SITE_OTHER): Payer: Medicare Other | Admitting: General Practice

## 2016-02-04 DIAGNOSIS — Z5181 Encounter for therapeutic drug level monitoring: Secondary | ICD-10-CM | POA: Diagnosis not present

## 2016-02-04 LAB — POCT INR: INR: 3.4

## 2016-02-04 NOTE — Progress Notes (Signed)
I have reviewed and agree with the plan. 

## 2016-02-04 NOTE — Patient Instructions (Signed)
Pre visit review using our clinic review tool, if applicable. No additional management support is needed unless otherwise documented below in the visit note. 

## 2016-02-07 ENCOUNTER — Telehealth: Payer: Self-pay | Admitting: Emergency Medicine

## 2016-02-07 ENCOUNTER — Other Ambulatory Visit (HOSPITAL_BASED_OUTPATIENT_CLINIC_OR_DEPARTMENT_OTHER): Payer: Medicare Other

## 2016-02-07 ENCOUNTER — Other Ambulatory Visit: Payer: Self-pay | Admitting: Internal Medicine

## 2016-02-07 ENCOUNTER — Encounter: Payer: Self-pay | Admitting: Hematology and Oncology

## 2016-02-07 ENCOUNTER — Ambulatory Visit (HOSPITAL_BASED_OUTPATIENT_CLINIC_OR_DEPARTMENT_OTHER): Payer: Medicare Other | Admitting: Hematology and Oncology

## 2016-02-07 ENCOUNTER — Ambulatory Visit: Payer: No Typology Code available for payment source | Admitting: Physical Medicine & Rehabilitation

## 2016-02-07 ENCOUNTER — Telehealth: Payer: Self-pay | Admitting: Hematology and Oncology

## 2016-02-07 DIAGNOSIS — D473 Essential (hemorrhagic) thrombocythemia: Secondary | ICD-10-CM | POA: Diagnosis not present

## 2016-02-07 DIAGNOSIS — Z7901 Long term (current) use of anticoagulants: Secondary | ICD-10-CM | POA: Diagnosis not present

## 2016-02-07 DIAGNOSIS — Z86711 Personal history of pulmonary embolism: Secondary | ICD-10-CM

## 2016-02-07 LAB — PROTIME-INR
INR: 1.7 — ABNORMAL LOW (ref 2.00–3.50)
Protime: 20.4 Seconds — ABNORMAL HIGH (ref 10.6–13.4)

## 2016-02-07 LAB — CBC WITH DIFFERENTIAL/PLATELET
BASO%: 0.8 % (ref 0.0–2.0)
Basophils Absolute: 0.1 10*3/uL (ref 0.0–0.1)
EOS%: 1.7 % (ref 0.0–7.0)
Eosinophils Absolute: 0.1 10*3/uL (ref 0.0–0.5)
HCT: 42.2 % (ref 34.8–46.6)
HGB: 14.1 g/dL (ref 11.6–15.9)
LYMPH%: 15.6 % (ref 14.0–49.7)
MCH: 36.2 pg — ABNORMAL HIGH (ref 25.1–34.0)
MCHC: 33.4 g/dL (ref 31.5–36.0)
MCV: 108.5 fL — ABNORMAL HIGH (ref 79.5–101.0)
MONO#: 0.4 10*3/uL (ref 0.1–0.9)
MONO%: 6.1 % (ref 0.0–14.0)
NEUT#: 4.9 10*3/uL (ref 1.5–6.5)
NEUT%: 75.8 % (ref 38.4–76.8)
Platelets: 420 10*3/uL — ABNORMAL HIGH (ref 145–400)
RBC: 3.89 10*6/uL (ref 3.70–5.45)
RDW: 15.1 % — ABNORMAL HIGH (ref 11.2–14.5)
WBC: 6.5 10*3/uL (ref 3.9–10.3)
lymph#: 1 10*3/uL (ref 0.9–3.3)
nRBC: 0 % (ref 0–0)

## 2016-02-07 MED ORDER — APIXABAN 2.5 MG PO TABS: 2.5000 mg | ORAL_TABLET | Freq: Two times a day (BID) | ORAL | 2 refills | Status: DC

## 2016-02-07 NOTE — Progress Notes (Signed)
[facility] OFFICE PROGRESS NOTE  Patient Care Team: [name], MD as PCP - General (Internal Medicine) Eunice Blase, MD (Inactive) as Consulting Physician (Family Medicine) Renato Shin, MD as Consulting Physician (Endocrinology) Netta Cedars, MD as Consulting Physician (Orthopedic Surgery) Lyndal Pulley, DO (Sports Medicine) Garvin Fila, MD (Neurology)  SUMMARY OF ONCOLOGIC HISTORY:   Essential thrombocytosis ([place])   08/07/2013 Miscellaneous    The patient is noted to have elevated platelet count      03/08/2014 Imaging    Positive for acute PE with CT evidence of right heart strain (RV/LV Ratio = 0.9) consistent with at least submassive (intermediate risk)PE.       03/09/2014 Imaging    US venous Doppler showed deep vein thrombosis noted in the right distal common femoral vein, femoral vein, and popliteal vein. DVT noted in the left femoral and popliteal veins      03/09/2014 Imaging    Patchy areas of acute left MCA territory infarction. 2. Several punctate foci of acute right MCA territory infarction. Possible trace subarachnoid hemorrhage in the high right frontal lobe. 3. Occluded left ICA and left MCA      03/20/2015 Imaging    Compared to MRI on 03/09/14, there has been expected evolutional change of left MCA infarction. In addition, there may be a few areas of acute-subacute infarcts in the left basal ganglia vs artifact.        07/31/2015 Pathology Results    Peripheral blood is positive for JAK2 mutation      07/31/2015 -  Chemotherapy    She is started on 500 mg daily Hydrea      08/15/2015 Miscellaneous    The dose of hydroxyurea is increased to 1000 mg daily      09/26/2015 Miscellaneous    Dose of Hydrea to 500 mg daily except on Saturdays and Sundays she takes 1000 mg.      10 /20/2017 Adverse Reaction    Dose of Hydrea is reduced to 500 mg daily        INTERVAL HISTORY: Please see below for problem oriented charting. She returns  today with her daughter. She had recent evaluation because of bleeding related to chronic anticoagulation therapy. Per discussion with her primary care doctor, her medication will be switched to Eliquis In the meantime, she is compliant taking hydroxyurea. Denies recent infection.  REVIEW OF SYSTEMS:   Constitutional: Denies fevers, chills or abnormal weight loss Eyes: Denies blurriness of vision Ears, nose, mouth, throat, and face: Denies mucositis or sore throat Respiratory: Denies cough, dyspnea or wheezes Cardiovascular: Denies palpitation, chest discomfort or lower extremity swelling Gastrointestinal:  Denies nausea, heartburn or change in bowel habits Skin: Denies abnormal skin rashes Lymphatics: Denies new lymphadenopathy or easy bruising Neurological:Denies numbness, tingling or new weaknesses Behavioral/Psych: Mood is stable, no new changes  All other systems were reviewed with the patient and are negative.  I have reviewed the past medical history, past surgical history, social history and family history with the patient and they are unchanged from previous note.  ALLERGIES:  has No Known Allergies.  MEDICATIONS:  Current Outpatient Prescriptions  Medication Sig Dispense Refill  . acetaminophen (TYLENOL) 325 MG tablet Take 1-2 tablets (325-650 mg total) by mouth every 4 (four) hours as needed for mild pain.    [name] albuterol (PROVENTIL HFA) 108 (90 Base) MCG/ACT inhaler Inhale 2 puffs into the lungs every 6 (six) hours as needed for wheezing or shortness of breath. 1 Inhaler  1  . baclofen (LIORESAL) 10 MG tablet Take 0.5 tablets (5 mg total) by mouth 3 (three) times daily. 45 each 0  . cephALEXin (KEFLEX) 250 MG capsule     . diclofenac sodium (VOLTAREN) 1 % GEL APPLY (2GMS) TOPICALLY THREE TIMES DAILY. 300 g 5  . gabapentin (NEURONTIN) 100 MG capsule Take 1 capsule (100 mg total) by mouth 3 (three) times daily. (Patient taking differently: Take 100 mg by mouth 2 (two) times  daily. ) 90 capsule 5  . methimazole (TAPAZOLE) 5 MG tablet TAKE 1 TABLET BY MOUTH TWICE WEEKLY 12 tablet 2  . pantoprazole sodium (PROTONIX) 40 mg/20 mL PACK Take 20 mLs (40 mg total) by mouth daily. 30 each 5  . polyethylene glycol (MIRALAX) packet Take 17 g by mouth 2 (two) times daily. (Patient taking differently: Take 17 g by mouth daily as needed. ) 14 each 0  . Probiotic Product (ALIGN) 4 MG CAPS Take 1 capsule by mouth daily.     . propranolol (INDERAL) 10 MG tablet TAKE 1 TABLET TWICE A DAY 60 tablet 5  . terconazole (TERAZOL 7) 0.4 % vaginal cream Place 1 applicator vaginally at bedtime. 90 g 3  . traZODone (DESYREL) 50 MG tablet Take 1 tablet (50 mg total) by mouth at bedtime as needed. for sleep 30 tablet 5  . apixaban (ELIQUIS) 2.5 MG TABS tablet Take 1 tablet (2.5 mg total) by mouth 2 (two) times daily. 60 tablet 2  . fluconazole (DIFLUCAN) 150 MG tablet     . hydroxyurea (HYDREA) 500 MG capsule Take 2 capsules (1,000 mg total) by mouth daily. May take with food to minimize GI side effects. (Patient taking differently: Take by mouth. Take 1 tab daily) 60 capsule 9  . traMADol (ULTRAM) 50 MG tablet TAKE 1 TO 2 TABLETS BY MOUTH 3 TIMES A DAY AS NEEDED 180 tablet 1   No current facility-administered medications for this visit.     PHYSICAL EXAMINATION: ECOG PERFORMANCE STATUS: 2 - Symptomatic, <50% confined to bed  Vitals:   02/07/16 1336  BP: (!) 111/57  Pulse: 83  Resp: 18  Temp: 98.7 F (37.1 C)   Filed Weights    GENERAL:alert, no distress and comfortable. She sits on the wheelchair SKIN: skin color, texture, turgor are normal, no rashes or significant lesions. Noted prominent blood vessels on her chest wall EYES: normal, Conjunctiva are pink and non-injected, sclera clear OROPHARYNX:no exudate, no erythema and lips, buccal mucosa, and tongue normal  NECK: supple, thyroid normal size, non-tender, without nodularity LYMPH:  no palpable lymphadenopathy in the cervical,  axillary or inguinal LUNGS: clear to auscultation and percussion with normal breathing effort HEART: regular rate & rhythm and no murmurs and no lower extremity edema ABDOMEN:abdomen soft, non-tender and normal bowel sounds Musculoskeletal:no cyanosis of digits and no clubbing  NEURO: alert & oriented x 3 with Dysarthria and persistent neurological deficit  LABORATORY DATA:  I have reviewed the data as listed    Component Value Date/Time   NA 142 01/26/2016 2115   K 3.7 01/26/2016 2115   CL 107 01/26/2016 2115   CO2 28 01/26/2016 2115   GLUCOSE 113 (H) 01/26/2016 2115   BUN 13 01/26/2016 2115   CREATININE 0.51 01/26/2016 2115   CALCIUM 9.1 01/26/2016 2115   PROT 6.6 01/26/2016 2115   ALBUMIN 3.4 (L) 01/26/2016 2115   AST 16 01/26/2016 2115   ALT 13 (L) 01/26/2016 2115   ALKPHOS 61 01/26/2016 2115   BILITOT 0.5  01/26/2016 2115   GFRNONAA >60 01/26/2016 2115   GFRAA >60 01/26/2016 2115    No results found for: SPEP, UPEP  Lab Results  Component Value Date   WBC 6.5 02/07/2016   NEUTROABS 4.9 02/07/2016   HGB 14.1 02/07/2016   HCT 42.2 02/07/2016   MCV 108.5 (H) 02/07/2016   PLT 420 (H) 02/07/2016      Chemistry      Component Value Date/Time   NA 142 01/26/2016 2115   K 3.7 01/26/2016 2115   CL 107 01/26/2016 2115   CO2 28 01/26/2016 2115   BUN 13 01/26/2016 2115   CREATININE 0.51 01/26/2016 2115      Component Value Date/Time   CALCIUM 9.1 01/26/2016 2115   ALKPHOS 61 01/26/2016 2115   AST 16 01/26/2016 2115   ALT 13 (L) 01/26/2016 2115   BILITOT 0.5 01/26/2016 2115     ASSESSMENT & PLAN:  Essential thrombocytosis ([place]) She tolerated hydroxyurea well. Her counts are stable with the dose of Hydrea to 500 mg daily I recommend we continue to same and I plan to see her back in 4 months  History of pulmonary embolism She had recent bleeding and inconsistent INR. I have discussed this with her primary care provider and she is currently being transitioned to  Eliquis I discussed with the daughter the rationale of switching treatment. The goal is strictly for secondary prevention I recommend minimum twice a year creatinine clearance monitoring. I will order that test next visit   Orders Placed This Encounter  Procedures  . Basic metabolic panel    Standing Status:   Standing    Number of Occurrences:   3    Standing Expiration Date:   02/06/2017   All questions were answered. The patient knows to call the clinic with any problems, questions or concerns. No barriers to learning was detected. I spent 15 minutes counseling the patient face to face. The total time spent in the appointment was 20 minutes and more than 50% was on counseling and review of test results     Heath Lark, MD 02/07/2016 7:10 PM

## 2016-02-07 NOTE — Telephone Encounter (Signed)
RX faxed to [facility] 

## 2016-02-07 NOTE — Assessment & Plan Note (Addendum)
She had recent bleeding and inconsistent INR. I have discussed this with her primary care provider and she is currently being transitioned to Eliquis I discussed with the daughter the rationale of switching treatment. The goal is strictly for secondary prevention I recommend minimum twice a year creatinine clearance monitoring. I will order that test next visit

## 2016-02-07 NOTE — Assessment & Plan Note (Signed)
She tolerated hydroxyurea well. Her counts are stable with the dose of Hydrea to 500 mg daily I recommend we continue to same and I plan to see her back in 4 months

## 2016-02-07 NOTE — Telephone Encounter (Signed)
Appointments scheduled per, 02/07/16 los. Patient was given a copy of the AVS report and appointment schedule per 02/07/16 los.

## 2016-02-08 NOTE — Telephone Encounter (Signed)
error 

## 2016-02-08 NOTE — Telephone Encounter (Signed)
Received call from daughter stating eliqus had not be sent to pharmacy. RX sent.

## 2016-02-10 DIAGNOSIS — R310 Gross hematuria: Secondary | ICD-10-CM | POA: Diagnosis not present

## 2016-02-10 DIAGNOSIS — N281 Cyst of kidney, acquired: Secondary | ICD-10-CM | POA: Diagnosis not present

## 2016-02-11 ENCOUNTER — Ambulatory Visit: Payer: Medicare Other | Admitting: Physical Medicine & Rehabilitation

## 2016-02-11 ENCOUNTER — Telehealth: Payer: Self-pay | Admitting: Internal Medicine

## 2016-02-11 NOTE — Telephone Encounter (Signed)
Call daughter  -- the other night she called b/c [name] had diarrhea after taking eliquis.  Just make sure she is not still having diarrhea.

## 2016-02-12 NOTE — Telephone Encounter (Signed)
Spoke with pts daughter, states she has not had diarrhea since the first day of taking the Eliquis.

## 2016-02-12 NOTE — Telephone Encounter (Signed)
noted 

## 2016-02-13 ENCOUNTER — Other Ambulatory Visit: Payer: Self-pay | Admitting: Internal Medicine

## 2016-02-13 ENCOUNTER — Encounter: Payer: Medicare Other | Attending: Physical Medicine & Rehabilitation

## 2016-02-13 ENCOUNTER — Encounter: Payer: Self-pay | Admitting: Physical Medicine & Rehabilitation

## 2016-02-13 ENCOUNTER — Ambulatory Visit (HOSPITAL_BASED_OUTPATIENT_CLINIC_OR_DEPARTMENT_OTHER): Payer: Medicare Other | Admitting: Physical Medicine & Rehabilitation

## 2016-02-13 VITALS — BP 118/75 | HR 86 | Resp 14

## 2016-02-13 DIAGNOSIS — G8190 Hemiplegia, unspecified affecting unspecified side: Secondary | ICD-10-CM | POA: Diagnosis not present

## 2016-02-13 DIAGNOSIS — R4701 Aphasia: Secondary | ICD-10-CM | POA: Diagnosis not present

## 2016-02-13 DIAGNOSIS — M7541 Impingement syndrome of right shoulder: Secondary | ICD-10-CM | POA: Diagnosis not present

## 2016-02-13 DIAGNOSIS — M1712 Unilateral primary osteoarthritis, left knee: Secondary | ICD-10-CM

## 2016-02-13 DIAGNOSIS — M6289 Other specified disorders of muscle: Secondary | ICD-10-CM | POA: Insufficient documentation

## 2016-02-13 DIAGNOSIS — G8110 Spastic hemiplegia affecting unspecified side: Secondary | ICD-10-CM

## 2016-02-13 DIAGNOSIS — I69320 Aphasia following cerebral infarction: Secondary | ICD-10-CM | POA: Diagnosis not present

## 2016-02-13 DIAGNOSIS — I639 Cerebral infarction, unspecified: Secondary | ICD-10-CM

## 2016-02-13 NOTE — Progress Notes (Signed)
Subjective:    Patient ID: Sandy Barrett, female    DOB: June 21, 1932, 83 y.o.   MRN: [id number]  HPI   Follow-up after Botox injection, patient has good relaxation. Right hand is well positioned, but she is getting some swelling in the arm. Has a new arm trough with Velcro straps. No falls or trauma to the arm. Adequate shoulder range of motion for hygiene and positioning and dressing. Left knee has pain with range of motion, movement, no falls or trauma. Pain Inventory Average Pain 5 Pain Right Now 5 My pain is intermittent, constant, sharp, dull, stabbing and aching  In the last 24 hours, has pain interfered with the following? General activity 6 Relation with others 6 Enjoyment of life 6 What TIME of day is your pain at its worst? all Sleep (in general) Fair  Pain is worse with: bending and standing Pain improves with: rest, therapy/exercise, medication, TENS and injections Relief from Meds: 7  Mobility use a wheelchair Do you have any goals in this area?  yes  Function retired  Neuro/Psych bladder control problems bowel control problems weakness trouble walking spasms dizziness  Prior Studies Any changes since last visit?  no  Physicians involved in your care Any changes since last visit?  no   Family History  Problem Relation Age of Onset  . Asthma Mother   . Asthma Father   . Prostate cancer Father   . Stroke Brother   . Breast cancer Sister   . Stomach cancer Sister   . Thyroid disease Neg Hx    Social History   Social History  . Marital status: Widowed    Spouse name: N/A  . Number of children: 2  . Years of education: 14   Occupational History  . retired     Restaurant manager, fast food   Social History Main Topics  . Smoking status: Never Smoker  . Smokeless tobacco: Never Used  . Alcohol use No  . Drug use: No  . Sexual activity: No   Other Topics Concern  . None   Social History Narrative   Widowed, lived alone prior to CVA 03/2014     SNF at [facility], then home 07/2014 with dtr   Right handed   Caffeine use- occasionally drinks tea   Past Surgical History:  Procedure Laterality Date  . ABDOMINAL HYSTERECTOMY  1976  . BREAST SURGERY     biopsy  . RADIOLOGY WITH ANESTHESIA Left 03/08/2014   Procedure: RADIOLOGY WITH ANESTHESIA;  Surgeon: Rob Hickman, MD;  Location: [facility];  Service: Radiology;  Laterality: Left;  . TONSILLECTOMY    . TOTAL HIP ARTHROPLASTY  1998    right   Past Medical History:  Diagnosis Date  . ARTHRITIS   . Arthritis   . Diverticulosis   . HYPERTENSION   . HYPERTHYROIDISM   . Hyperthyroidism    s/p I-131 ablation 03/2011 of multinod goiter  . INSOMNIA   . MIGRAINE HEADACHE   . OBESITY   . Posttraumatic stress disorder   . Pulmonary embolism ([place]) 03/2014   with DVT  . Stroke ([place]) 03/2014   dysarthria   BP 118/75   Pulse 86   Resp 14   SpO2 98%   Opioid Risk Score:   Fall Risk Score:  `1  Depression screen PHQ 2/9  Depression screen [facility] 2/9 07/30/2015 06/21/2015 04/29/2015 03/18/2015 02/04/2015 11/26/2014 09/17/2014  Decreased Interest 0 0 0 0 0 0 0  Down, Depressed, Hopeless 0 0  1 1 1 1  0  PHQ - 2 Score 0 0 1 1 1 1  0  Some recent data might be hidden      Review of Systems  Constitutional: Negative.   HENT: Negative.   Eyes: Negative.   Respiratory: Negative.   Cardiovascular: Negative.   Gastrointestinal: Negative.   Endocrine: Negative.   Genitourinary: Negative.   Musculoskeletal: Negative.   Skin: Negative.   Allergic/Immunologic: Negative.   Neurological: Negative.   Hematological: Negative.   Psychiatric/Behavioral: Negative.   All other systems reviewed and are negative.      Objective:   Physical Exam  Constitutional: She is oriented to person, place, and time. She appears well-developed and well-nourished.  HENT:  Head: Normocephalic and atraumatic.  Eyes: Conjunctivae and EOM are normal. Pupils are equal, round, and reactive to light.   Neck: Normal range of motion.  Neurological: She is alert and oriented to person, place, and time.  Psychiatric: She has a normal mood and affect. Her behavior is normal. Judgment and thought content normal.  Nursing note and vitals reviewed.  Left knee has tenderness along the medial joint line and lateral joint line. Patellar tendon, quadriceps tendon. Difficult to assess secondary to nonverbal, globally aphasia, interpreting facial grimacing  No evidence of left knee effusion [place] 2. Right pronator teres Ash[place] 1. Right finger flexors and wrist flexors, Ashworth 1-2 at the lumbricals      Assessment & Plan:  1. Right spastic hemiplegia improved tone after Dysport injection right wrist and finger  flexors as well as pronator teres Would repeat with same dosing and muscle groups  2. Left knee pain, history osteoarthritis, has responded well to corticosteroid injection in the past. We'll repeat  Knee injection   Indication:Left Knee pain not relieved by medication management and other conservative care.  Informed consent was obtained after describing risks and benefits of the procedure with the patient, this includes bleeding, bruising, infection and medication side effects. The patient wishes to proceed and has given written consent. The patient was placed in a recumbent position. The medial aspect of the knee was marked and prepped with Betadine and alcohol. It was then entered with a 25-gauge 1-1/2 inch inserted into the knee joint. After negative draw back for blood, a solution containing one ML of 6mg  per mL betamethasone and 3 mL of 1% lidocaine were injected. The patient tolerated the procedure well. Post procedure instructions were given.

## 2016-02-14 ENCOUNTER — Ambulatory Visit: Payer: Medicare Other

## 2016-02-17 DIAGNOSIS — M1990 Unspecified osteoarthritis, unspecified site: Secondary | ICD-10-CM | POA: Diagnosis not present

## 2016-02-17 DIAGNOSIS — I69351 Hemiplegia and hemiparesis following cerebral infarction affecting right dominant side: Secondary | ICD-10-CM | POA: Diagnosis not present

## 2016-02-17 DIAGNOSIS — D510 Vitamin B12 deficiency anemia due to intrinsic factor deficiency: Secondary | ICD-10-CM | POA: Diagnosis not present

## 2016-02-17 DIAGNOSIS — H9190 Unspecified hearing loss, unspecified ear: Secondary | ICD-10-CM | POA: Diagnosis not present

## 2016-02-17 DIAGNOSIS — I10 Essential (primary) hypertension: Secondary | ICD-10-CM | POA: Diagnosis not present

## 2016-02-17 DIAGNOSIS — I69391 Dysphagia following cerebral infarction: Secondary | ICD-10-CM | POA: Diagnosis not present

## 2016-02-17 DIAGNOSIS — R1310 Dysphagia, unspecified: Secondary | ICD-10-CM | POA: Diagnosis not present

## 2016-02-17 DIAGNOSIS — I69320 Aphasia following cerebral infarction: Secondary | ICD-10-CM | POA: Diagnosis not present

## 2016-02-17 DIAGNOSIS — F4310 Post-traumatic stress disorder, unspecified: Secondary | ICD-10-CM | POA: Diagnosis not present

## 2016-02-17 DIAGNOSIS — Z7901 Long term (current) use of anticoagulants: Secondary | ICD-10-CM | POA: Diagnosis not present

## 2016-02-20 ENCOUNTER — Telehealth: Payer: Self-pay | Admitting: Emergency Medicine

## 2016-02-20 NOTE — Telephone Encounter (Signed)
LVM with daughter [name].

## 2016-02-20 NOTE — Telephone Encounter (Signed)
Daughter called in and states that R arm (weak arm) is more swollen than normal. The left side of her face is swollen as well as her left hand. This has been going on 3-4 weeks. She has been taking botox injections on her right side and they switched the med last time (last injection was a month ago) We recently switched her blood thinner, daughter would like to know if either of these changes could cause the swelling. Please advise.

## 2016-02-20 NOTE — Telephone Encounter (Signed)
No blood thinner would not cause the swelling.  I do not think the injection would either.  She should be evaluated.

## 2016-02-21 ENCOUNTER — Telehealth: Payer: Self-pay

## 2016-02-21 DIAGNOSIS — D510 Vitamin B12 deficiency anemia due to intrinsic factor deficiency: Secondary | ICD-10-CM | POA: Diagnosis not present

## 2016-02-21 DIAGNOSIS — I69391 Dysphagia following cerebral infarction: Secondary | ICD-10-CM | POA: Diagnosis not present

## 2016-02-21 DIAGNOSIS — R1310 Dysphagia, unspecified: Secondary | ICD-10-CM | POA: Diagnosis not present

## 2016-02-21 DIAGNOSIS — I10 Essential (primary) hypertension: Secondary | ICD-10-CM | POA: Diagnosis not present

## 2016-02-21 DIAGNOSIS — I69320 Aphasia following cerebral infarction: Secondary | ICD-10-CM | POA: Diagnosis not present

## 2016-02-21 DIAGNOSIS — I69351 Hemiplegia and hemiparesis following cerebral infarction affecting right dominant side: Secondary | ICD-10-CM | POA: Diagnosis not present

## 2016-02-21 NOTE — Telephone Encounter (Signed)
Patients daughter called this morning states that right arm continues to swell despite being in different wheelchair, states left arm is beginning to swell as well and has developed hip pain. Please advise.

## 2016-02-24 NOTE — Telephone Encounter (Signed)
LVM with EC [name] [place] to follow up with PCP and to give our office a call, if there are any questions or concerns.

## 2016-02-24 NOTE — Telephone Encounter (Signed)
If left arm is swelling would follow up with PCP. Since this would not be a stroke related issue

## 2016-02-25 ENCOUNTER — Other Ambulatory Visit: Payer: Self-pay | Admitting: Physical Medicine & Rehabilitation

## 2016-02-25 NOTE — Telephone Encounter (Signed)
Patient requesting a refill of baclofen, no mention in previous notes to continue this medication, please advise.

## 2016-02-26 DIAGNOSIS — D510 Vitamin B12 deficiency anemia due to intrinsic factor deficiency: Secondary | ICD-10-CM | POA: Diagnosis not present

## 2016-02-26 DIAGNOSIS — I69320 Aphasia following cerebral infarction: Secondary | ICD-10-CM | POA: Diagnosis not present

## 2016-02-26 DIAGNOSIS — I69351 Hemiplegia and hemiparesis following cerebral infarction affecting right dominant side: Secondary | ICD-10-CM | POA: Diagnosis not present

## 2016-02-26 DIAGNOSIS — I69391 Dysphagia following cerebral infarction: Secondary | ICD-10-CM | POA: Diagnosis not present

## 2016-02-26 DIAGNOSIS — I10 Essential (primary) hypertension: Secondary | ICD-10-CM | POA: Diagnosis not present

## 2016-02-26 DIAGNOSIS — R1310 Dysphagia, unspecified: Secondary | ICD-10-CM | POA: Diagnosis not present

## 2016-02-27 DIAGNOSIS — I10 Essential (primary) hypertension: Secondary | ICD-10-CM | POA: Diagnosis not present

## 2016-02-27 DIAGNOSIS — D510 Vitamin B12 deficiency anemia due to intrinsic factor deficiency: Secondary | ICD-10-CM | POA: Diagnosis not present

## 2016-02-27 DIAGNOSIS — R1310 Dysphagia, unspecified: Secondary | ICD-10-CM | POA: Diagnosis not present

## 2016-02-27 DIAGNOSIS — I69351 Hemiplegia and hemiparesis following cerebral infarction affecting right dominant side: Secondary | ICD-10-CM | POA: Diagnosis not present

## 2016-02-27 DIAGNOSIS — I69320 Aphasia following cerebral infarction: Secondary | ICD-10-CM | POA: Diagnosis not present

## 2016-02-27 DIAGNOSIS — I69391 Dysphagia following cerebral infarction: Secondary | ICD-10-CM | POA: Diagnosis not present

## 2016-03-04 ENCOUNTER — Ambulatory Visit: Payer: Medicare Other | Admitting: Nurse Practitioner

## 2016-03-12 ENCOUNTER — Other Ambulatory Visit: Payer: Self-pay | Admitting: Internal Medicine

## 2016-03-18 ENCOUNTER — Encounter: Payer: Self-pay | Admitting: Nurse Practitioner

## 2016-03-18 ENCOUNTER — Ambulatory Visit (INDEPENDENT_AMBULATORY_CARE_PROVIDER_SITE_OTHER): Payer: Medicare Other | Admitting: Nurse Practitioner

## 2016-03-18 ENCOUNTER — Encounter (INDEPENDENT_AMBULATORY_CARE_PROVIDER_SITE_OTHER): Payer: Self-pay

## 2016-03-18 VITALS — BP 115/69 | HR 77 | Ht 60.0 in

## 2016-03-18 DIAGNOSIS — I639 Cerebral infarction, unspecified: Secondary | ICD-10-CM | POA: Diagnosis not present

## 2016-03-18 DIAGNOSIS — G8110 Spastic hemiplegia affecting unspecified side: Secondary | ICD-10-CM | POA: Diagnosis not present

## 2016-03-18 DIAGNOSIS — R4701 Aphasia: Secondary | ICD-10-CM

## 2016-03-18 DIAGNOSIS — Z993 Dependence on wheelchair: Secondary | ICD-10-CM | POA: Diagnosis not present

## 2016-03-18 DIAGNOSIS — I63312 Cerebral infarction due to thrombosis of left middle cerebral artery: Secondary | ICD-10-CM | POA: Diagnosis not present

## 2016-03-18 DIAGNOSIS — R4702 Dysphasia: Secondary | ICD-10-CM | POA: Diagnosis not present

## 2016-03-18 DIAGNOSIS — I10 Essential (primary) hypertension: Secondary | ICD-10-CM

## 2016-03-18 NOTE — Patient Instructions (Signed)
Continue Eliquis  for stroke prevention  maintain strict control of hypertension with blood pressure goal below 140/90, today's reading 115/69 Continue range of motion exercises HEP  Important to get patient up every day F/U in 8 months, next with Dr. Leonie Man

## 2016-03-18 NOTE — Progress Notes (Signed)
[facility]  PATIENT: Sandy Barrett DOB: August 09, 1932   REASON FOR VISIT: Follow-up for stroke 03/08/2014 HISTORY FROM: Daughter Sandy Barrett    HISTORY OF PRESENT ILLNESS: HISTORY PS81 year African-American lady who seen today for first office follow-up visit following hospital admission for stroke on 03/08/14. She is accompanied by 2 of her daughters. Patient presented with sudden onset of speech difficulties, right-sided weakness and facial droop. Her last known well was unknown when she had talked to her sister on the phone and she went for had appointment and 1 PM and had is immediately noted that she was confused and having speech difficulties. She presented to the emergency room with aphasia and right hemiparesis and right hemianopsia. Last seen normal was outside the window for TPA upon initial determination hence CT scan with perfusion was obtained which showed a large ischemic penumbra in the left MCA territory. After several telephonic discussions with family members it was determined that the last seen normal  Determined initially was incorrect inpatient was within the timeframe for tPA which was given without complications.. Patient however had widened mediastinum on chest x-ray requiring a CT scan of the chest to rule out aortic dissection and she was subsequently found to have a large thyroid mass with bilateral pulmonary emboli. IV TPA was administered. She was found to have left terminal carotid artery occlusion for which she underwent unsuccessful attempt at revascularization with mechanical embolectomy by Dr. the mesh were due to technical difficulties vessel could not be opened. She was admitted to intensive care unit and was initially intubated. She remained globally aphasic with dense right hemiplegia and visual field loss. 2-D echo showed normal ejection fraction but there was presence of interatrial septal aneurysm but bubble study and TEE were not done. She was  also found to have extensive bilateral deep and thrombosis and pulmonary emboli and hence despite the large stroke was started cautiously on IV heparin without bolus. MRI scan confirmed subsequently a moderate size left MCA infarct with nonhemorrhagic transformation. There are also tiny punctate foci of right MCA infarcts. MRA showed occluded terminal left ICA and MCA. Carotid ultrasound showed no significant extracranial stenosis but abnormal left ICA waveform suggestive of distal occlusion. Patient was found to have a large intrathoracic goiter with displacement of the trachea, aortic branches and superior vena cava with sluggish flow in the left jugular vein but no thrombus. LDL cholesterol is 53 hemoglobin A1c was 5.4. Protein C and S activity were normal and presented, and was negative. Telemetry monitoring did not reveal atrial fibrillation. Patient was transferred for rehabilitation and is currently living in a nursing home where she is on warfarin which is tolerating well without any bleeding complications. She remains aphasic with dense right hemiplegia. She is able to assist with transfers and can stand up with the one-person assist with physical therapy and gait belt walk with little. The family is getting ready to transfer the patient home over the next few weeks and have a lot of questions about her care and prognosis which  I answered. Update 02/18/2015 PS: She returns for follow-up after last visit in May 2016  She remains neurologically stable without recurrent stroke or TIA symptoms however the family has noticed that she often stares off into space and points towards things and they feel that she may be having visual hallucinations. She does not appear to be distress with this. These seem to be more pronounced in the last 3 weeks following her urinary tract infection.  She was initially treated with Cipro followed by a different antibiotic. Patient remains wheelchair bound and is total care and  family provides 24-hour care at home. She remains on Coumadin for her pulmonary embolism and is tolerating well without bleeding or bruising. She has not had any falls, head injury, or seizures that have been noted UPDATE August  28th 2017CM Sandy Barrett, 83 year old female returns for follow-up with daughter Sandy Barrett with whom she lives. She remains neurologically stable without recurrent stroke or TIA symptoms. She remains on Coumadin for secondary stroke prevention and previous history of pulmonary emboli. Dementia labs at last visit were normal except for elevated TSH. Her thyroid medication was reduced. EEG in the office without evidence of seizure activity and mild generalized slowing which is nonspecific. Abnormal MRI brain (without) demonstrating:03/20/15  1. Few foci of left basal ganglia acute-subacute infarctions vs artifact due to relative signal difference compared to adjacent tissues. 2. Large chronic left MCA ischemic infarction with encephalomalacia and gliosis affecting the left frontal, parietal and temporal regions. Dystrophic calcification vs hemosiderin deposition in the left basal ganglia. Wallerian degeneration and atrophy of the left cerebral peduncle.  3. Occluded left ICA with decreased branching within the left MCA.  4. Compared to MRI on 03/09/14, there has been expected evolutional change of left MCA infarction. In addition, there may be a few areas of acute-subacute infarcts in the left basal ganglia vs artifact.  She returns for reevaluation. Daughter has CNA daily to assist with activities of daily living. UPDATE 03/14/2018CM Sandy Barrett, 83 year old female returns for follow-up with her daughter Sandy Barrett who lives with her. Her Coumadin has been changed to eliquis due to bleeding  (conjunctival hemorrhage) and inconsistent INR. This was changed in February. She has not had recurrent stroke or TIA symptoms. She has not had any significant bruising on eliquis or bleeding. She  remains neurologically stable she continues to get Botox to her right hand and cortisone to her left knee osteoarthritis. According to the daughter she is sleeping well at night and  her appetite is good she has not had any choking spells. She is incontinent of bowel and bladder and wears briefs. Daughter uses a [employer] fast streamer to get her up every day. CNA helps 3 hours a day presently. She returns for reevaluation REVIEW OF SYSTEMS: Full 14 system review of systems performed and notable only for those listed, all others are neg:  Constitutional: neg  Cardiovascular: neg Ear/Nose/Throat: neg  Skin: neg Eyes: neg Respiratory: neg Gastroitestinal: neg  Genitourinary incontinence of bowel and bladder Hematology/Lymphatic: Easy bruising  Endocrine: neg Musculoskeletal: Walking difficulty  Allergy/Immunology: neg Neurological: Speech difficulty Psychiatric: neg Sleep : neg   ALLERGIES: No Known Allergies  HOME MEDICATIONS: Outpatient Medications Prior to Visit  Medication Sig Dispense Refill  . acetaminophen (TYLENOL) 325 MG tablet Take 1-2 tablets (325-650 mg total) by mouth every 4 (four) hours as needed for mild pain.    [name] albuterol (PROVENTIL HFA) 108 (90 Base) MCG/ACT inhaler Inhale 2 puffs into the lungs every 6 (six) hours as needed for wheezing or shortness of breath. 1 Inhaler 1  . apixaban (ELIQUIS) 2.5 MG TABS tablet Take 1 tablet (2.5 mg total) by mouth 2 (two) times daily. 60 tablet 2  . cephALEXin (KEFLEX) 250 MG capsule     . diclofenac sodium (VOLTAREN) 1 % GEL APPLY (2GMS) TOPICALLY THREE TIMES DAILY. 300 g 5  . fluconazole (DIFLUCAN) 150 MG tablet Take 150 mg by mouth as needed.     [name]  gabapentin (NEURONTIN) 100 MG capsule Take 1 capsule (100 mg total) by mouth 3 (three) times daily. (Patient taking differently: Take 100 mg by mouth 2 (two) times daily. ) 90 capsule 5  . hydroxyurea (HYDREA) 500 MG capsule Take 2 capsules (1,000 mg total) by mouth daily. May take with food to  minimize GI side effects. (Patient taking differently: Take by mouth. Take 1 tab daily) 60 capsule 9  . methimazole (TAPAZOLE) 5 MG tablet TAKE 1 TABLET BY MOUTH TWICE WEEKLY 12 tablet 2  . polyethylene glycol (MIRALAX / GLYCOLAX) packet TAKE 17G BY MOUTH TWICE A DAY 14 packet 1  . Probiotic Product (ALIGN) 4 MG CAPS Take 1 capsule by mouth daily.     . propranolol (INDERAL) 10 MG tablet TAKE 1 TABLET TWICE A DAY 60 tablet 5  . PROTONIX 40 MG PACK TAKE 20 MLS (40 MG TOTAL) BY MOUTH DAILY. 30 each 5  . terconazole (TERAZOL 7) 0.4 % vaginal cream Place 1 applicator vaginally at bedtime. 90 g 3  . traMADol (ULTRAM) 50 MG tablet TAKE 1 TO 2 TABLETS BY MOUTH 3 TIMES A DAY AS NEEDED 180 tablet 1  . traZODone (DESYREL) 50 MG tablet TAKE 1 TABLET (50 MG TOTAL) BY MOUTH AT BEDTIME AS NEEDED. FOR SLEEP 30 tablet 5  . baclofen (LIORESAL) 10 MG tablet TAKE 1/2 TABLET 3 TIMES A DAY (Patient not taking: Reported on 03/18/2016) 45 tablet 0   No facility-administered medications prior to visit.     PAST MEDICAL HISTORY: Past Medical History:  Diagnosis Date  . ARTHRITIS   . Arthritis   . Diverticulosis   . HYPERTENSION   . HYPERTHYROIDISM   . Hyperthyroidism    s/p I-131 ablation 03/2011 of multinod goiter  . INSOMNIA   . MIGRAINE HEADACHE   . OBESITY   . Posttraumatic stress disorder   . Pulmonary embolism ([place]) 03/2014   with DVT  . Stroke [facility]) 03/2014   dysarthria    PAST SURGICAL HISTORY: Past Surgical History:  Procedure Laterality Date  . ABDOMINAL HYSTERECTOMY  1976  . BREAST SURGERY     biopsy  . RADIOLOGY WITH ANESTHESIA Left 03/08/2014   Procedure: RADIOLOGY WITH ANESTHESIA;  Surgeon: Rob Hickman, MD;  Location: [place];  Service: Radiology;  Laterality: Left;  . TONSILLECTOMY    . TOTAL HIP ARTHROPLASTY  1998    right    FAMILY HISTORY: Family History  Problem Relation Age of Onset  . Asthma Mother   . Asthma Father   . Prostate cancer Father   . Stroke Brother   .  Breast cancer Sister   . Stomach cancer Sister   . Thyroid disease Neg Hx     SOCIAL HISTORY: Social History   Social History  . Marital status: Widowed    Spouse name: N/A  . Number of children: 2  . Years of education: 14   Occupational History  . retired     Restaurant manager, fast food   Social History Main Topics  . Smoking status: Never Smoker  . Smokeless tobacco: Never Used  . Alcohol use No  . Drug use: No  . Sexual activity: No   Other Topics Concern  . Not on file   Social History Narrative   Widowed, lived alone prior to CVA 03/2014   SNF at [facility], then home 07/2014 with dtr   Right handed   Caffeine use- occasionally drinks tea     PHYSICAL EXAM  Vitals:  03/18/16 1256  BP: 115/69  Pulse: 77  Height: 5' (1.524 m)   There is no height or weight on file to calculate BMI. General: Obese elderly African-American lady, seated, in no evident distress Head: head normocephalic and atraumatic.  Neck: supple with no carotid  bruits Cardiovascular: regular rate and rhythm, no murmurs Musculoskeletal: Right foot drop and wears foot brace and right hand brace    Neurological examination  Mental Status: Awake and fully alert. Globally aphasic and will follow only occasional commands and mimicking. Speech is  nonsensical. Will not follow any commands consistently.  Cranial Nerves: Fundoscopic exam reveals sharp disc margins. Pupils equal, briskly reactive to light. Extraocular movements full without nystagmus. Visual fields show dense right homonymous hemianopsia .Hearing intact. Facial sensation intact. Mild right lower facial weakness., tongue, palate moves normally and symmetrically.  Motor: Dense spastic right hemiplegia with 0/5 strength with right foot drop and hand and wrist flexion deformity. Increased tone on the right. Normal antigravity strength on the left. Sensory.: intact to touch ,pinprick .on the left but impaired on the right  Coordination:  Accurate on the left and cannot be tested on the right.  Gait and Station: Not tested as patient sitting in wheelchair  Reflexes: 2+ and asymmetric much brisker on the right side Toes downgoing.   DIAGNOSTIC DATA (LABS, IMAGING, TESTING) - I reviewed patient records, labs, notes, testing and imaging myself where available.  Lab Results  Component Value Date   WBC 6.5 02/07/2016   HGB 14.1 02/07/2016   HCT 42.2 02/07/2016   MCV 108.5 (H) 02/07/2016   PLT 420 (H) 02/07/2016      Component Value Date/Time   NA 142 01/26/2016 2115   K 3.7 01/26/2016 2115   CL 107 01/26/2016 2115   CO2 28 01/26/2016 2115   GLUCOSE 113 (H) 01/26/2016 2115   BUN 13 01/26/2016 2115   CREATININE 0.51 01/26/2016 2115   CALCIUM 9.1 01/26/2016 2115   PROT 6.6 01/26/2016 2115   ALBUMIN 3.4 (L) 01/26/2016 2115   AST 16 01/26/2016 2115   ALT 13 (L) 01/26/2016 2115   ALKPHOS 61 01/26/2016 2115   BILITOT 0.5 01/26/2016 2115   GFRNONAA >60 01/26/2016 2115   GFRAA >60 01/26/2016 2115     Lab Results  Component Value Date   TSH 1.25 12/03/2015      ASSESSMENT AND PLAN 83year lady with large left middle cerebral artery infarct secondary to occlusion of the left internal carotid artery with concurrent bilateral lower extremity deep vein thrombosis and pulmonary embolism with strong suspicion for paradoxical emboli in March 2016 treated with IV TPA and failed mechanical thrombectomy with significant residual disability. EEG in the office without evidence of seizure activity and mild generalized slowing which is nonspecific. Abnormal MRI brain (without) demonstrating:03/20/15 Few foci of left basal ganglia acute-subacute infarctions vs artifact due to relative signal difference compared to adjacent tissues.. Large chronic left MCA ischemic infarction with encephalomalacia and gliosis affecting the left frontal, parietal and temporal regions. Dystrophic calcification vs hemosiderin deposition in the left basal  ganglia. Wallerian degeneration and atrophy of the left cerebral peduncle. Occluded left ICA with decreased branching within the left MCA. Compared to MRI on 03/09/14, there has been expected evolutional change of left MCA infarction. In addition, there may be a few areas of acute-subacute infarcts in the left basal ganglia vs artifact.Coumadin has been changed to eliquis due to bleeding  (conjunctival hemorrhage) and inconsistent INR  Continue Eliquis  for stroke prevention  maintain strict control of hypertension with blood pressure goal below 140/90, today's reading 115/69 Continue range of motion exercises HEP  Important to get patient up every day F/U in 8 months, next with Dr. Leonie Man Greater than 50% of time during this 25 minute visit was spent on counseling,explanation and education of diagnosis, planning of further management, discussion with patient and daughter coordination of care and chart review Follow-up in 8 months, next with Dr. Sloan Leiter, [facility], [facility], [facility] [address] [place],  [address] [phone]

## 2016-03-20 NOTE — Progress Notes (Signed)
I agree with the above plan 

## 2016-03-24 DIAGNOSIS — R1310 Dysphagia, unspecified: Secondary | ICD-10-CM | POA: Diagnosis not present

## 2016-03-24 DIAGNOSIS — I69351 Hemiplegia and hemiparesis following cerebral infarction affecting right dominant side: Secondary | ICD-10-CM | POA: Diagnosis not present

## 2016-03-24 DIAGNOSIS — D510 Vitamin B12 deficiency anemia due to intrinsic factor deficiency: Secondary | ICD-10-CM | POA: Diagnosis not present

## 2016-03-24 DIAGNOSIS — I10 Essential (primary) hypertension: Secondary | ICD-10-CM | POA: Diagnosis not present

## 2016-03-24 DIAGNOSIS — I69391 Dysphagia following cerebral infarction: Secondary | ICD-10-CM | POA: Diagnosis not present

## 2016-03-24 DIAGNOSIS — I69320 Aphasia following cerebral infarction: Secondary | ICD-10-CM | POA: Diagnosis not present

## 2016-03-25 ENCOUNTER — Ambulatory Visit: Payer: Medicare Other | Admitting: Podiatry

## 2016-03-27 ENCOUNTER — Ambulatory Visit (HOSPITAL_BASED_OUTPATIENT_CLINIC_OR_DEPARTMENT_OTHER): Payer: Medicare Other | Admitting: Physical Medicine & Rehabilitation

## 2016-03-27 ENCOUNTER — Telehealth: Payer: Self-pay | Admitting: Internal Medicine

## 2016-03-27 ENCOUNTER — Encounter: Payer: Self-pay | Admitting: Physical Medicine & Rehabilitation

## 2016-03-27 ENCOUNTER — Encounter: Payer: Medicare Other | Attending: Physical Medicine & Rehabilitation

## 2016-03-27 VITALS — BP 90/56 | HR 79 | Resp 14

## 2016-03-27 DIAGNOSIS — M6289 Other specified disorders of muscle: Secondary | ICD-10-CM | POA: Insufficient documentation

## 2016-03-27 DIAGNOSIS — I69320 Aphasia following cerebral infarction: Secondary | ICD-10-CM | POA: Insufficient documentation

## 2016-03-27 DIAGNOSIS — G8110 Spastic hemiplegia affecting unspecified side: Secondary | ICD-10-CM

## 2016-03-27 DIAGNOSIS — G8111 Spastic hemiplegia affecting right dominant side: Secondary | ICD-10-CM | POA: Diagnosis not present

## 2016-03-27 DIAGNOSIS — M7541 Impingement syndrome of right shoulder: Secondary | ICD-10-CM | POA: Diagnosis not present

## 2016-03-27 DIAGNOSIS — G8190 Hemiplegia, unspecified affecting unspecified side: Secondary | ICD-10-CM | POA: Insufficient documentation

## 2016-03-27 NOTE — Telephone Encounter (Signed)
Pts daughter called and said that she has a prescription for Yevonne Pax that she has been giving to her daily. She wanted to know if she can give it to her as the bottle says, "as needed", if she has trouble sleeping. Please advise. Thanks [employer]

## 2016-03-27 NOTE — Telephone Encounter (Signed)
Yes, ok to give as needed

## 2016-03-27 NOTE — Progress Notes (Signed)
Dysport Injection for spasticity using needle EMG guidance  Preinjection pronator Ashworth 3, FPL Ashworth 3, FDS Ashworth 2, FDP Ashworth 2, wrist flexor Ashworth 2 Dilution: 200 Units/ml Indication: Severe spasticity which interferes with ADL,mobility and/or  hygiene and is unresponsive to medication management and other conservative care Informed consent was obtained after describing risks and benefits of the procedure with the patient. This includes bleeding, bruising, infection, excessive weakness, or medication side effects. A REMS form is on file and signed. Needle: 25 g 2" needle electrode Number of units per muscle Pronator teres, 200 FCU 100 FDP 200 FDS 200 FCR 100 FPL 200 All injections were done after obtaining appropriate EMG activity and after negative drawback for blood. The patient tolerated the procedure well. Post procedure instructions were given. A followup appointment was made.

## 2016-03-27 NOTE — Telephone Encounter (Signed)
LVM informing pts daughter.  

## 2016-03-27 NOTE — Patient Instructions (Signed)

## 2016-03-27 NOTE — Telephone Encounter (Signed)
Please advise 

## 2016-04-01 ENCOUNTER — Ambulatory Visit (INDEPENDENT_AMBULATORY_CARE_PROVIDER_SITE_OTHER): Payer: Medicare Other | Admitting: Endocrinology

## 2016-04-01 VITALS — BP 96/62 | HR 95

## 2016-04-01 DIAGNOSIS — E0590 Thyrotoxicosis, unspecified without thyrotoxic crisis or storm: Secondary | ICD-10-CM

## 2016-04-01 DIAGNOSIS — R4701 Aphasia: Secondary | ICD-10-CM | POA: Diagnosis not present

## 2016-04-01 DIAGNOSIS — I639 Cerebral infarction, unspecified: Secondary | ICD-10-CM

## 2016-04-01 LAB — T4, FREE: Free T4: 1.04 ng/dL (ref 0.60–1.60)

## 2016-04-01 LAB — TSH: TSH: 1.55 u[IU]/mL (ref 0.35–4.50)

## 2016-04-01 NOTE — Patient Instructions (Signed)
blood tests are requested for you today.  We'll let you know about the results.   if ever you have fever while taking methimazole, stop it and call us, even if the reason is obvious, because of the risk of a rare side-effect. Please come back for a follow-up appointment in 4-6 months.

## 2016-04-01 NOTE — Progress Notes (Signed)
Subjective:    Patient ID: Sandy Barrett, female    DOB: 1932-12-10, 83 y.o.   MRN: [id number]  HPI Pt returns for f/u of hyperthyroidism (due to a multinodular goiter; she had RAI in 2013; hyperthyroidism resolved, but it recurred in 2016; she has never had bx; she had a CVA in 2016, so she is not a surgical candidate, and can't be isolated for another dose of RAI).   dtr says pt has few mos of slight rash at the shoulders, but no assoc itching.  Past Medical History:  Diagnosis Date  . ARTHRITIS   . Arthritis   . Diverticulosis   . HYPERTENSION   . HYPERTHYROIDISM   . Hyperthyroidism    s/p I-131 ablation 03/2011 of multinod goiter  . INSOMNIA   . MIGRAINE HEADACHE   . OBESITY   . Posttraumatic stress disorder   . Pulmonary embolism ([place]) 03/2014   with DVT  . Stroke [facility]) 03/2014   dysarthria    Past Surgical History:  Procedure Laterality Date  . ABDOMINAL HYSTERECTOMY  1976  . BREAST SURGERY     biopsy  . RADIOLOGY WITH ANESTHESIA Left 03/08/2014   Procedure: RADIOLOGY WITH ANESTHESIA;  Surgeon: Rob Hickman, MD;  Location: [facility];  Service: Radiology;  Laterality: Left;  . TONSILLECTOMY    . TOTAL HIP ARTHROPLASTY  1998    right    Social History   Social History  . Marital status: Widowed    Spouse name: N/A  . Number of children: 2  . Years of education: 14   Occupational History  . retired     Restaurant manager, fast food   Social History Main Topics  . Smoking status: Never Smoker  . Smokeless tobacco: Never Used  . Alcohol use No  . Drug use: No  . Sexual activity: No   Other Topics Concern  . Not on file   Social History Narrative   Widowed, lived alone prior to CVA 03/2014   SNF at [facility], then home 07/2014 with dtr   Right handed   Caffeine use- occasionally drinks tea    Current Outpatient Prescriptions on File Prior to Visit  Medication Sig Dispense Refill  . acetaminophen (TYLENOL) 325 MG tablet Take 1-2 tablets (325-650 mg  total) by mouth every 4 (four) hours as needed for mild pain.    [name] albuterol (PROVENTIL HFA) 108 (90 Base) MCG/ACT inhaler Inhale 2 puffs into the lungs every 6 (six) hours as needed for wheezing or shortness of breath. 1 Inhaler 1  . apixaban (ELIQUIS) 2.5 MG TABS tablet Take 1 tablet (2.5 mg total) by mouth 2 (two) times daily. 60 tablet 2  . cephALEXin (KEFLEX) 250 MG capsule     . diclofenac sodium (VOLTAREN) 1 % GEL APPLY (2GMS) TOPICALLY THREE TIMES DAILY. 300 g 5  . fluconazole (DIFLUCAN) 150 MG tablet Take 150 mg by mouth as needed.     . gabapentin (NEURONTIN) 100 MG capsule Take 1 capsule (100 mg total) by mouth 3 (three) times daily. (Patient taking differently: Take 100 mg by mouth 2 (two) times daily. ) 90 capsule 5  . hydroxyurea (HYDREA) 500 MG capsule Take 2 capsules (1,000 mg total) by mouth daily. May take with food to minimize GI side effects. (Patient taking differently: Take by mouth. Take 1 tab daily) 60 capsule 9  . methimazole (TAPAZOLE) 5 MG tablet TAKE 1 TABLET BY MOUTH TWICE WEEKLY 12 tablet 2  . polyethylene glycol (MIRALAX /  GLYCOLAX) packet TAKE 17G BY MOUTH TWICE A DAY 14 packet 1  . Probiotic Product (ALIGN) 4 MG CAPS Take 1 capsule by mouth daily.     . propranolol (INDERAL) 10 MG tablet TAKE 1 TABLET TWICE A DAY 60 tablet 5  . PROTONIX 40 MG PACK TAKE 20 MLS (40 MG TOTAL) BY MOUTH DAILY. 30 each 5  . terconazole (TERAZOL 7) 0.4 % vaginal cream Place 1 applicator vaginally at bedtime. 90 g 3  . traMADol (ULTRAM) 50 MG tablet TAKE 1 TO 2 TABLETS BY MOUTH 3 TIMES A DAY AS NEEDED 180 tablet 1  . traZODone (DESYREL) 50 MG tablet TAKE 1 TABLET (50 MG TOTAL) BY MOUTH AT BEDTIME AS NEEDED. FOR SLEEP 30 tablet 5   No current facility-administered medications on file prior to visit.     No Known Allergies  Family History  Problem Relation Age of Onset  . Asthma Mother   . Asthma Father   . Prostate cancer Father   . Stroke Brother   . Breast cancer Sister   .  Stomach cancer Sister   . Thyroid disease Neg Hx     BP 96/62   Pulse 95   SpO2 94%    Review of Systems Denies fever    Objective:   Physical Exam VITAL SIGNS:  See vs page.   GENERAL: no distress.  In wheelchair.  NECK: I cannot palpate the goiter.  Skin: minimal rash on the shoulders.  Lab Results  Component Value Date   TSH 1.55 04/01/2016   T4TOTAL 7.6 08/21/2014      Assessment & Plan:  Hyperthyroidism: well-controlled. Rash, new.  Not thyroid-related  Patient Instructions  blood tests are requested for you today.  We'll let you know about the results.   if ever you have fever while taking methimazole, stop it and call us, even if the reason is obvious, because of the risk of a rare side-effect. Please come back for a follow-up appointment in 4-6 months.

## 2016-04-03 ENCOUNTER — Ambulatory Visit: Payer: Medicare Other | Admitting: Podiatry

## 2016-04-12 IMAGING — DX DG HIP (WITH OR WITHOUT PELVIS) 2-3V*L*
3 series · 3 of 3 positions shown · non-contrast
Comparison: None

CLINICAL DATA: LEFT hip pain, prior stroke, patient nonverbal and
unable to provide additional history

EXAM:
DG HIP (WITH OR WITHOUT PELVIS) 2-3V LEFT

[t pelvis ap]
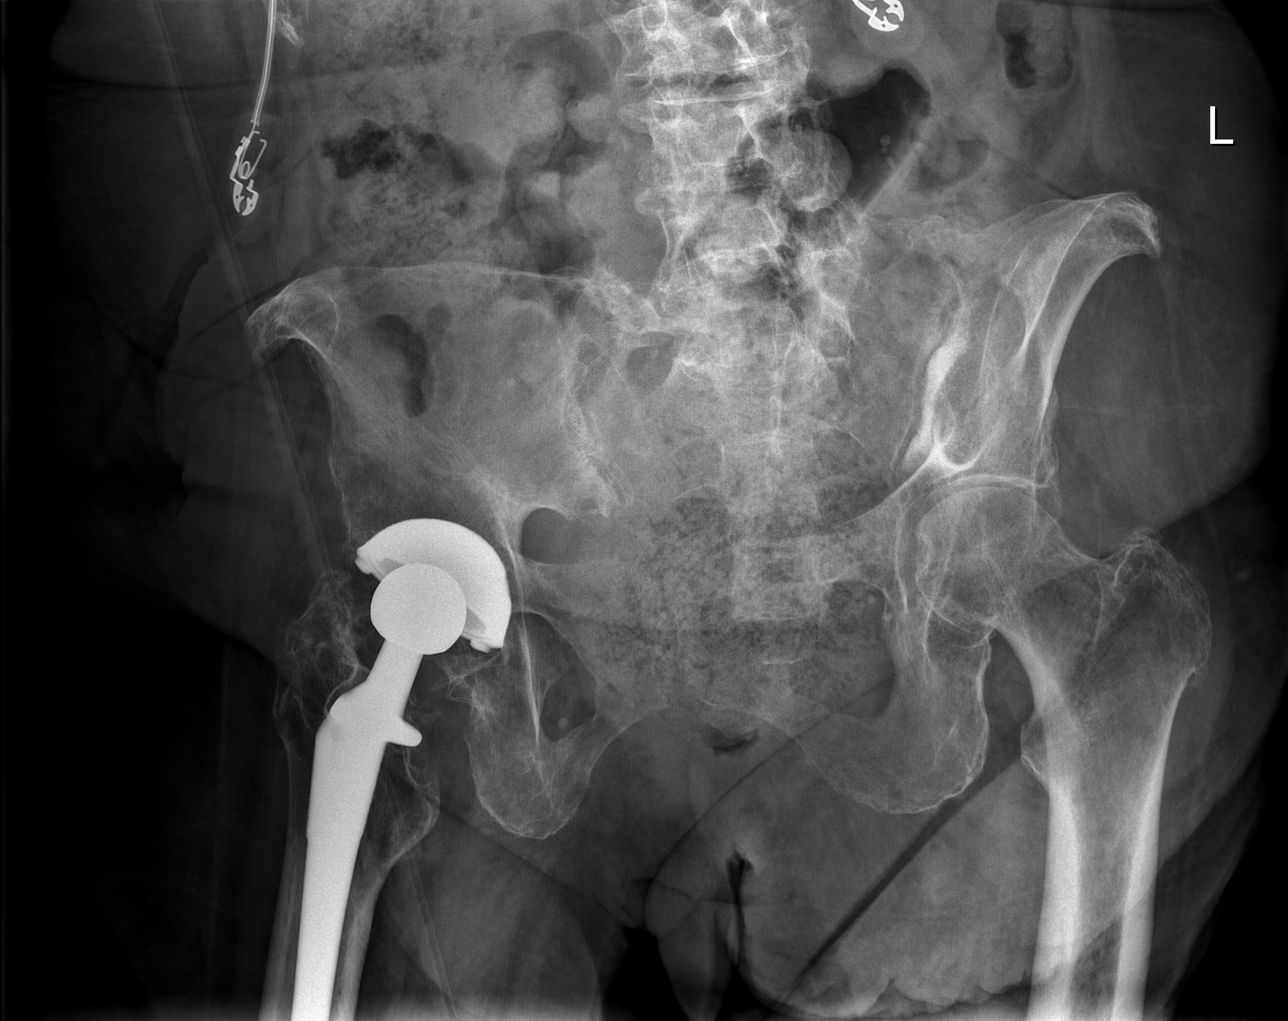

[t hip ap left]
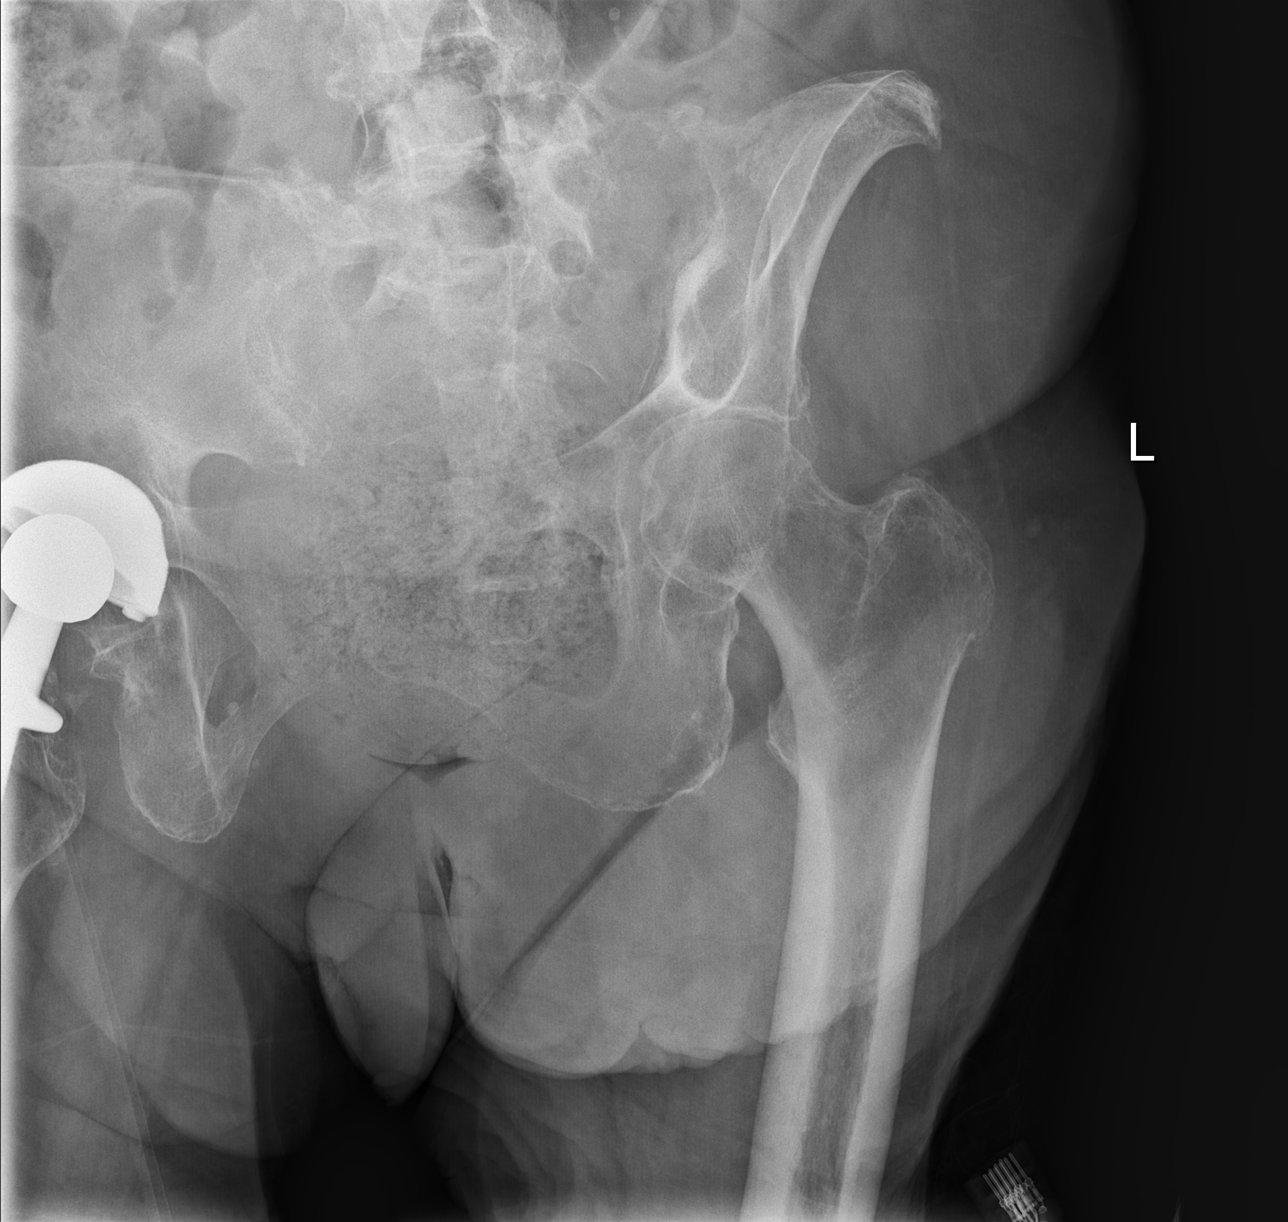

[t hip frog leg left]
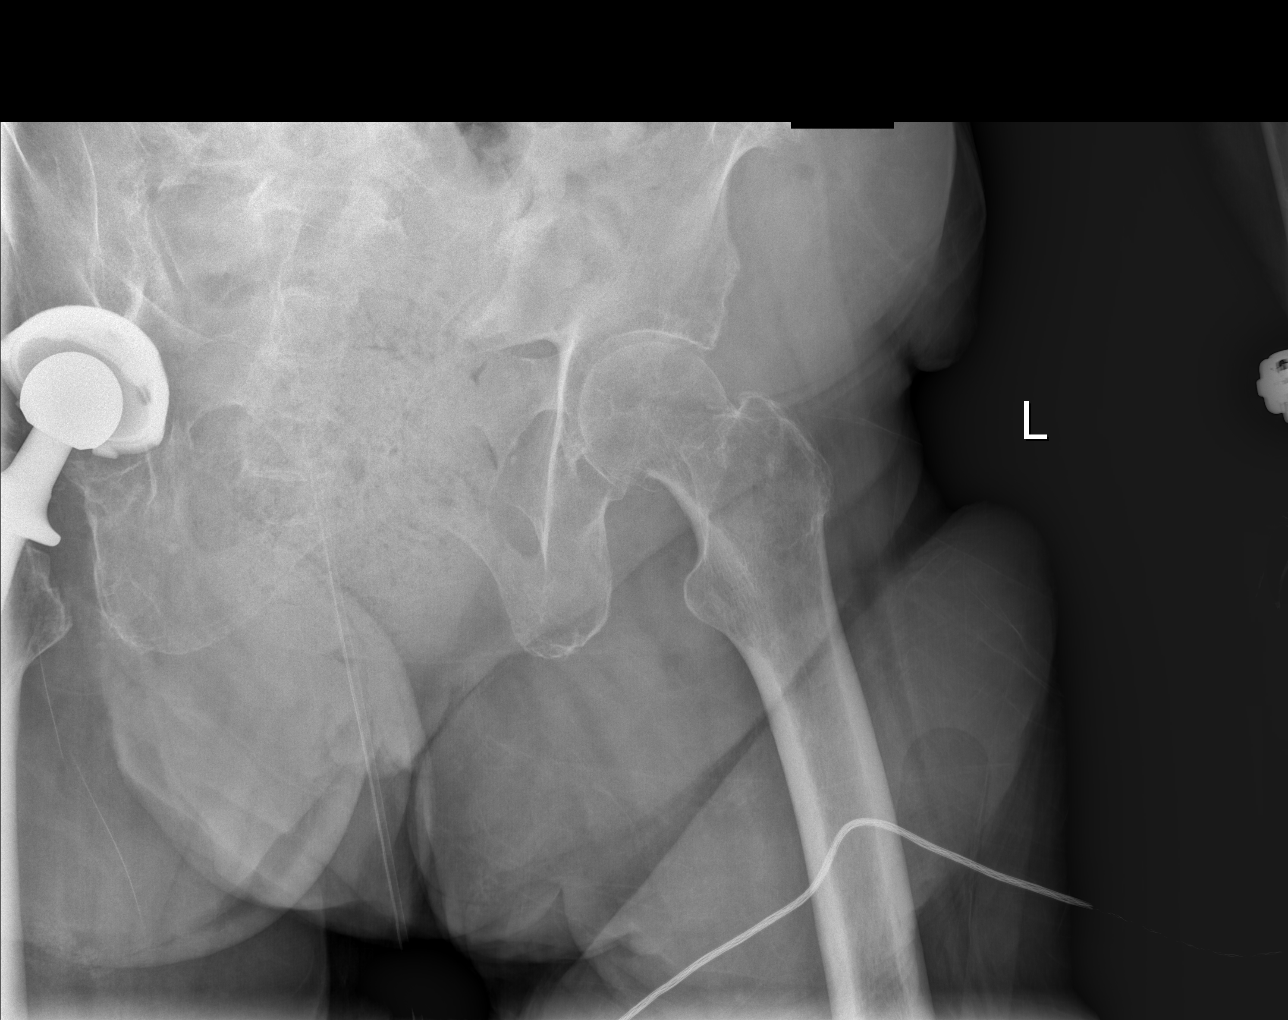

[3 of 3 positions shown; findings below may reference images not displayed]

FINDINGS: Marked osseous demineralization.

Components of RIGHT hip prosthesis without gross evidence of
dislocation.

Rotated to the RIGHT.

Minimal narrowing of LEFT hip joint.

SI joints grossly preserved.

Degenerative disc and facet disease changes at visualized lower
lumbar spine.

Increased stool in rectum.

No acute fracture, dislocation or bone destruction.
IMPRESSION: Osseous demineralization with prior RIGHT hip replacement.

No definite acute bony abnormalities.

## 2016-04-13 ENCOUNTER — Encounter: Payer: Self-pay | Admitting: Family Medicine

## 2016-04-13 ENCOUNTER — Ambulatory Visit (INDEPENDENT_AMBULATORY_CARE_PROVIDER_SITE_OTHER)
Admission: RE | Admit: 2016-04-13 | Discharge: 2016-04-13 | Disposition: A | Discharge status: Home / Self Care | Payer: Medicare Other | Source: Ambulatory Visit | Attending: Family Medicine | Admitting: Family Medicine

## 2016-04-13 ENCOUNTER — Ambulatory Visit (INDEPENDENT_AMBULATORY_CARE_PROVIDER_SITE_OTHER): Payer: Medicare Other | Admitting: Family Medicine

## 2016-04-13 ENCOUNTER — Other Ambulatory Visit: Payer: Self-pay | Admitting: Family Medicine

## 2016-04-13 VITALS — BP 128/68 | HR 84 | Temp 98.4°F | Wt 176.0 lb

## 2016-04-13 DIAGNOSIS — J189 Pneumonia, unspecified organism: Secondary | ICD-10-CM | POA: Diagnosis not present

## 2016-04-13 DIAGNOSIS — R4701 Aphasia: Secondary | ICD-10-CM

## 2016-04-13 DIAGNOSIS — I639 Cerebral infarction, unspecified: Secondary | ICD-10-CM

## 2016-04-13 DIAGNOSIS — R05 Cough: Secondary | ICD-10-CM | POA: Diagnosis not present

## 2016-04-13 MED ORDER — AMOXICILLIN-POT CLAVULANATE 875-125 MG PO TABS: 1.0000 | ORAL_TABLET | Freq: Two times a day (BID) | ORAL | 0 refills | Status: DC

## 2016-04-13 NOTE — Progress Notes (Signed)
Subjective:    Patient ID: Sandy Barrett, female    DOB: [birth year], 83 y.o.   MRN: [id number]  HPI  Sandy Barrett is an 83 year old female who presents today with cough that has not been productive for 5 days.  Her daughter reports that cough has worsened and she has noticed wheezing. She also describes cough as "rattling."  Associated rhinitis with clear drainage, sinus pressure, mild sore throat, and post nasal drip. Daughter denies fever, chills, sweats, N/V/D. Treatment with albuterol and Tussin DM has provided limited benefit per daughter. Obtaining history is challenging as patient has a history of CVA and aphasia due to stroke.   Influenza vaccine is UTD; daughter denies any recent sick contact exposure and denies flu exposure  Prior neurological exam noted 03/18/2016 noted that patient is globally aphasic and will only follow occasional commands and mimicking with speech that is nonsensical. This not also indicates that she will not follow commands consistently.   Review of Systems  Constitutional: Negative for chills and fever.       Aphasia secondary history of CVA  HENT: Positive for postnasal drip, rhinorrhea, sinus pressure and sore throat.   Respiratory: Positive for cough. Negative for shortness of breath and wheezing.   Cardiovascular: Negative for chest pain and palpitations.  Gastrointestinal: Negative for abdominal pain, diarrhea, nausea and vomiting.  Musculoskeletal: Negative for myalgias.  Neurological: Negative for dizziness and headaches.   Past Medical History:  Diagnosis Date  . ARTHRITIS   . Arthritis   . Diverticulosis   . HYPERTENSION   . HYPERTHYROIDISM   . Hyperthyroidism    s/p I-131 ablation 03/2011 of multinod goiter  . INSOMNIA   . MIGRAINE HEADACHE   . OBESITY   . Posttraumatic stress disorder   . Pulmonary embolism ([place]) 03/2014   with DVT  . Stroke [facility]) 03/2014   dysarthria     Social History   Social History  . Marital status:  Widowed    Spouse name: N/A  . Number of children: 2  . Years of education: 14   Occupational History  . retired     Restaurant manager, fast food   Social History Main Topics  . Smoking status: Never Smoker  . Smokeless tobacco: Never Used  . Alcohol use No  . Drug use: No  . Sexual activity: No   Other Topics Concern  . Not on file   Social History Narrative   Widowed, lived alone prior to CVA 03/2014   SNF at [facility], then home 07/2014 with dtr   Right handed   Caffeine use- occasionally drinks tea    Past Surgical History:  Procedure Laterality Date  . ABDOMINAL HYSTERECTOMY  1976  . BREAST SURGERY     biopsy  . RADIOLOGY WITH ANESTHESIA Left 03/08/2014   Procedure: RADIOLOGY WITH ANESTHESIA;  Surgeon: Rob Hickman, MD;  Location: [facility];  Service: Radiology;  Laterality: Left;  . TONSILLECTOMY    . TOTAL HIP ARTHROPLASTY  1998    right    Family History  Problem Relation Age of Onset  . Asthma Mother   . Asthma Father   . Prostate cancer Father   . Stroke Brother   . Breast cancer Sister   . Stomach cancer Sister   . Thyroid disease Neg Hx     No Known Allergies  Current Outpatient Prescriptions on File Prior to Visit  Medication Sig Dispense Refill  . acetaminophen (TYLENOL) 325 MG tablet Take 1-2  tablets (325-650 mg total) by mouth every 4 (four) hours as needed for mild pain.    [name] albuterol (PROVENTIL HFA) 108 (90 Base) MCG/ACT inhaler Inhale 2 puffs into the lungs every 6 (six) hours as needed for wheezing or shortness of breath. 1 Inhaler 1  . apixaban (ELIQUIS) 2.5 MG TABS tablet Take 1 tablet (2.5 mg total) by mouth 2 (two) times daily. 60 tablet 2  . cephALEXin (KEFLEX) 250 MG capsule     . diclofenac sodium (VOLTAREN) 1 % GEL APPLY (2GMS) TOPICALLY THREE TIMES DAILY. 300 g 5  . fluconazole (DIFLUCAN) 150 MG tablet Take 150 mg by mouth as needed.     . gabapentin (NEURONTIN) 100 MG capsule Take 1 capsule (100 mg total) by mouth 3 (three) times  daily. (Patient taking differently: Take 100 mg by mouth 2 (two) times daily. ) 90 capsule 5  . hydroxyurea (HYDREA) 500 MG capsule Take 2 capsules (1,000 mg total) by mouth daily. May take with food to minimize GI side effects. (Patient taking differently: Take by mouth. Take 1 tab daily) 60 capsule 9  . methimazole (TAPAZOLE) 5 MG tablet TAKE 1 TABLET BY MOUTH TWICE WEEKLY 12 tablet 2  . polyethylene glycol (MIRALAX / GLYCOLAX) packet TAKE 17G BY MOUTH TWICE A DAY 14 packet 1  . Probiotic Product (ALIGN) 4 MG CAPS Take 1 capsule by mouth daily.     . propranolol (INDERAL) 10 MG tablet TAKE 1 TABLET TWICE A DAY 60 tablet 5  . PROTONIX 40 MG PACK TAKE 20 MLS (40 MG TOTAL) BY MOUTH DAILY. 30 each 5  . terconazole (TERAZOL 7) 0.4 % vaginal cream Place 1 applicator vaginally at bedtime. 90 g 3  . traMADol (ULTRAM) 50 MG tablet TAKE 1 TO 2 TABLETS BY MOUTH 3 TIMES A DAY AS NEEDED 180 tablet 1  . traZODone (DESYREL) 50 MG tablet TAKE 1 TABLET (50 MG TOTAL) BY MOUTH AT BEDTIME AS NEEDED. FOR SLEEP 30 tablet 5   No current facility-administered medications on file prior to visit.     BP 128/68 (BP Location: Left Arm, Patient Position: Sitting, Cuff Size: Large)   Pulse 84   Temp 98.4 F (36.9 C) (Oral)   Wt 176 lb (79.8 kg)   SpO2 91%   BMI 34.37 kg/m       Objective:   Physical Exam  Constitutional:  Presenting in a wheelchair, elderly, optimally nourished nontoxic appearing female  HENT:  Nose: Rhinorrhea present.  Mouth/Throat: No oropharyngeal exudate or posterior oropharyngeal erythema.  Cerumen noted in left TM; erythematous nares; no sinus tenderness appreciated  Eyes: No scleral icterus.  Neck: Neck supple.  Cardiovascular: Normal rate and regular rhythm.   Pulmonary/Chest: Effort normal.  Auscultation of breath sounds challenging to assess. Rales noted in left lower lobe with limited exam as patient was asked to take deep breaths however she did not follow this direction.  Review of neurology assessment noted that patient is globally aphasic and will not follow commands and demonstrates nonsensical speech.  This is consistent and daughter states that this remains unchanged and is stable.  Cough noted to be productive during exam however no sputum was not observed. No signs of respiratory distress   Abdominal: Soft. Bowel sounds are normal.  Lymphadenopathy:    She has no cervical adenopathy.  Neurological: She is alert.  Skin: Skin is warm and dry. No rash noted.      Assessment & Plan:  1. Community acquired pneumonia, unspecified laterality  Limited exam due to difficulty following requests; rales auscultated; RR 17; no signs of respiratory distress; pulse oximeter 96%; will treat with Augmentin and obtain chest X-ray today; discussed close follow up with daughter and advised her to follow up with PCP in 2 days or sooner if she develops worsening symptoms such as SOB or fever. - amoxicillin-clavulanate (AUGMENTIN) 875-125 MG tablet; Take 1 tablet by mouth 2 (two) times daily.  Dispense: 20 tablet; Refill: 0  Daughter verbalized understanding for close follow up in 2 days or sooner if needed. Advised patient to hold daily cephalexin for prevention of UTIs daily with new prescription for Augmentin. She has a scheduled follow up with her urologist in 3 days and will review treatment at that time.  Delano Metz, FNP-C

## 2016-04-13 NOTE — Progress Notes (Signed)
Pre visit review using our clinic review tool, if applicable. No additional management support is needed unless otherwise documented below in the visit note. 

## 2016-04-13 NOTE — Patient Instructions (Addendum)
It was a pleasure to see you today. Please hold Keflex while taking this medication and follow up with urologist as scheduled this week.  Please go to [facility] - located [address] across the [address] from [place] - in the basement - Hours: 8:30-5:30 PM M-F. Do not need appointment.    Community-Acquired Pneumonia, Adult Pneumonia is an infection of the lungs. One type of pneumonia can happen while a person is in a hospital. A different type can happen when a person is not in a hospital (community-acquired pneumonia). It is easy for this kind to spread from person to person. It can spread to you if you breathe near an infected person who coughs or sneezes. Some symptoms include:  A dry cough.  A wet (productive) cough.  Fever.  Sweating.  Chest pain. Follow these instructions at home:  Take over-the-counter and prescription medicines only as told by your doctor.  Only take cough medicine if you are losing sleep.  If you were prescribed an antibiotic medicine, take it as told by your doctor. Do not stop taking the antibiotic even if you start to feel better.  Sleep with your head and neck raised (elevated). You can do this by putting a few pillows under your head, or you can sleep in a recliner.  Do not use tobacco products. These include cigarettes, chewing tobacco, and e-cigarettes. If you need help quitting, ask your doctor.  Drink enough water to keep your pee (urine) clear or pale yellow. A shot (vaccine) can help prevent pneumonia. Shots are often suggested for:  People older than 81 years of age.  People older than 81 years of age:  Who are having cancer treatment.  Who have long-term (chronic) lung disease.  Who have problems with their body's defense system (immune system). You may also prevent pneumonia if you take these actions:  Get the flu (influenza) shot every year.  Go to the dentist as often as told.  Wash your hands often. If soap and  water are not available, use hand sanitizer. Contact a doctor if:  You have a fever.  You lose sleep because your cough medicine does not help. Get help right away if:  You are short of breath and it gets worse.  You have more chest pain.  Your sickness gets worse. This is very serious if:  You are an older adult.  Your body's defense system is weak.  You cough up blood. This information is not intended to replace advice given to you by your health care provider. Make sure you discuss any questions you have with your health care provider. Document Released: 06/10/2007 Document Revised: 05/30/2015 Document Reviewed: 04/18/2014 Elsevier Interactive Patient Education  2017 [place] NOW OFFER   [facility] FAST TRACK!!!  SAME DAY Appointments for ACUTE CARE  Such as: Sprains, Injuries, cuts, abrasions, rashes, muscle pain, joint pain, back pain Colds, flu, sore throats, headache, allergies, cough, fever  Ear pain, sinus and eye infections Abdominal pain, nausea, vomiting, diarrhea, upset stomach Animal/insect bites  3 Easy Ways to Schedule: Walk-In Scheduling Call in scheduling Mychart Sign-up: [email or web address]

## 2016-04-14 ENCOUNTER — Telehealth: Payer: Self-pay | Admitting: Internal Medicine

## 2016-04-14 NOTE — Telephone Encounter (Signed)
Please advise 

## 2016-04-14 NOTE — Telephone Encounter (Signed)
Pt called was seen at [facility] and chest Xray showed inlarged goiter larger last time. Wanted to make aware of Dr. Quay Burow. Would you like the Pt to come in?

## 2016-04-14 NOTE — Telephone Encounter (Signed)
If she is not feeling better I do want to see her.    For the thyroid  - the only treatment options are the radioactive iodine which needs to be ordered by Dr Loanne Drilling  - I am not sure if they discussed her thyroid at their recent visit.  The other option is an ENT referral to discuss surgery.

## 2016-04-16 ENCOUNTER — Telehealth: Payer: Self-pay | Admitting: Internal Medicine

## 2016-04-16 DIAGNOSIS — I69351 Hemiplegia and hemiparesis following cerebral infarction affecting right dominant side: Secondary | ICD-10-CM | POA: Diagnosis not present

## 2016-04-16 DIAGNOSIS — D510 Vitamin B12 deficiency anemia due to intrinsic factor deficiency: Secondary | ICD-10-CM | POA: Diagnosis not present

## 2016-04-16 DIAGNOSIS — I69391 Dysphagia following cerebral infarction: Secondary | ICD-10-CM | POA: Diagnosis not present

## 2016-04-16 DIAGNOSIS — I10 Essential (primary) hypertension: Secondary | ICD-10-CM | POA: Diagnosis not present

## 2016-04-16 DIAGNOSIS — I69320 Aphasia following cerebral infarction: Secondary | ICD-10-CM | POA: Diagnosis not present

## 2016-04-16 DIAGNOSIS — R1310 Dysphagia, unspecified: Secondary | ICD-10-CM | POA: Diagnosis not present

## 2016-04-16 NOTE — Telephone Encounter (Signed)
LVM informing Pts daughter.

## 2016-04-16 NOTE — Telephone Encounter (Signed)
Requesting orders for recert for home health services for PT one time a week for five weeks.  Suggesting a home health nurse to cath for urinalysis.  States that Dr. Quay Burow had placed orders for patient to go to [facility] urology.  States this is a lot of trouble to get patient into the office and seen.  Is requesting orders to be placed.

## 2016-04-17 ENCOUNTER — Encounter: Payer: Self-pay | Admitting: Emergency Medicine

## 2016-04-17 DIAGNOSIS — Z7901 Long term (current) use of anticoagulants: Secondary | ICD-10-CM | POA: Diagnosis not present

## 2016-04-17 DIAGNOSIS — D510 Vitamin B12 deficiency anemia due to intrinsic factor deficiency: Secondary | ICD-10-CM | POA: Diagnosis not present

## 2016-04-17 DIAGNOSIS — H9190 Unspecified hearing loss, unspecified ear: Secondary | ICD-10-CM | POA: Diagnosis not present

## 2016-04-17 DIAGNOSIS — M1990 Unspecified osteoarthritis, unspecified site: Secondary | ICD-10-CM | POA: Diagnosis not present

## 2016-04-17 DIAGNOSIS — F4310 Post-traumatic stress disorder, unspecified: Secondary | ICD-10-CM | POA: Diagnosis not present

## 2016-04-17 DIAGNOSIS — R1310 Dysphagia, unspecified: Secondary | ICD-10-CM | POA: Diagnosis not present

## 2016-04-17 DIAGNOSIS — I10 Essential (primary) hypertension: Secondary | ICD-10-CM | POA: Diagnosis not present

## 2016-04-17 DIAGNOSIS — I69320 Aphasia following cerebral infarction: Secondary | ICD-10-CM | POA: Diagnosis not present

## 2016-04-17 DIAGNOSIS — I69351 Hemiplegia and hemiparesis following cerebral infarction affecting right dominant side: Secondary | ICD-10-CM | POA: Diagnosis not present

## 2016-04-17 DIAGNOSIS — I69391 Dysphagia following cerebral infarction: Secondary | ICD-10-CM | POA: Diagnosis not present

## 2016-04-17 NOTE — Telephone Encounter (Signed)
[place] for both - let me know if I need to place any orders

## 2016-04-17 NOTE — Telephone Encounter (Signed)
Spoke with Jonelle Sidle to give verbal orders per MD.

## 2016-04-17 NOTE — Telephone Encounter (Signed)
Please advise on Cath orders.

## 2016-04-19 ENCOUNTER — Other Ambulatory Visit: Payer: Self-pay | Admitting: Endocrinology

## 2016-04-20 ENCOUNTER — Inpatient Hospital Stay (HOSPITAL_COMMUNITY)
Admission: EM | Admit: 2016-04-20 | Discharge: 2016-04-29 | DRG: 178 | Disposition: A | Discharge status: Home Health | Payer: Medicare Other | Attending: Internal Medicine | Admitting: Internal Medicine

## 2016-04-20 ENCOUNTER — Emergency Department (HOSPITAL_COMMUNITY): Payer: Medicare Other

## 2016-04-20 ENCOUNTER — Encounter (HOSPITAL_COMMUNITY): Payer: Self-pay | Admitting: Emergency Medicine

## 2016-04-20 DIAGNOSIS — R7989 Other specified abnormal findings of blood chemistry: Secondary | ICD-10-CM | POA: Diagnosis present

## 2016-04-20 DIAGNOSIS — R05 Cough: Secondary | ICD-10-CM | POA: Diagnosis not present

## 2016-04-20 DIAGNOSIS — Z7189 Other specified counseling: Secondary | ICD-10-CM

## 2016-04-20 DIAGNOSIS — Z823 Family history of stroke: Secondary | ICD-10-CM

## 2016-04-20 DIAGNOSIS — G43909 Migraine, unspecified, not intractable, without status migrainosus: Secondary | ICD-10-CM | POA: Diagnosis present

## 2016-04-20 DIAGNOSIS — R221 Localized swelling, mass and lump, neck: Secondary | ICD-10-CM | POA: Diagnosis not present

## 2016-04-20 DIAGNOSIS — M1990 Unspecified osteoarthritis, unspecified site: Secondary | ICD-10-CM | POA: Diagnosis present

## 2016-04-20 DIAGNOSIS — E669 Obesity, unspecified: Secondary | ICD-10-CM | POA: Diagnosis present

## 2016-04-20 DIAGNOSIS — Z86718 Personal history of other venous thrombosis and embolism: Secondary | ICD-10-CM | POA: Diagnosis not present

## 2016-04-20 DIAGNOSIS — Z515 Encounter for palliative care: Secondary | ICD-10-CM | POA: Diagnosis not present

## 2016-04-20 DIAGNOSIS — E0590 Thyrotoxicosis, unspecified without thyrotoxic crisis or storm: Secondary | ICD-10-CM | POA: Diagnosis not present

## 2016-04-20 DIAGNOSIS — Z7401 Bed confinement status: Secondary | ICD-10-CM | POA: Diagnosis not present

## 2016-04-20 DIAGNOSIS — Z6832 Body mass index (BMI) 32.0-32.9, adult: Secondary | ICD-10-CM

## 2016-04-20 DIAGNOSIS — J986 Disorders of diaphragm: Secondary | ICD-10-CM

## 2016-04-20 DIAGNOSIS — E042 Nontoxic multinodular goiter: Secondary | ICD-10-CM | POA: Diagnosis present

## 2016-04-20 DIAGNOSIS — E0520 Thyrotoxicosis with toxic multinodular goiter without thyrotoxic crisis or storm: Secondary | ICD-10-CM | POA: Diagnosis present

## 2016-04-20 DIAGNOSIS — I69351 Hemiplegia and hemiparesis following cerebral infarction affecting right dominant side: Secondary | ICD-10-CM

## 2016-04-20 DIAGNOSIS — I10 Essential (primary) hypertension: Secondary | ICD-10-CM | POA: Diagnosis present

## 2016-04-20 DIAGNOSIS — Z96649 Presence of unspecified artificial hip joint: Secondary | ICD-10-CM | POA: Diagnosis present

## 2016-04-20 DIAGNOSIS — I69320 Aphasia following cerebral infarction: Secondary | ICD-10-CM | POA: Diagnosis not present

## 2016-04-20 DIAGNOSIS — Z66 Do not resuscitate: Secondary | ICD-10-CM | POA: Diagnosis not present

## 2016-04-20 DIAGNOSIS — J189 Pneumonia, unspecified organism: Secondary | ICD-10-CM | POA: Diagnosis present

## 2016-04-20 DIAGNOSIS — E079 Disorder of thyroid, unspecified: Secondary | ICD-10-CM | POA: Diagnosis not present

## 2016-04-20 DIAGNOSIS — Z803 Family history of malignant neoplasm of breast: Secondary | ICD-10-CM

## 2016-04-20 DIAGNOSIS — I69321 Dysphasia following cerebral infarction: Secondary | ICD-10-CM | POA: Diagnosis not present

## 2016-04-20 DIAGNOSIS — Z90710 Acquired absence of both cervix and uterus: Secondary | ICD-10-CM

## 2016-04-20 DIAGNOSIS — J690 Pneumonitis due to inhalation of food and vomit: Principal | ICD-10-CM | POA: Diagnosis present

## 2016-04-20 DIAGNOSIS — J80 Acute respiratory distress syndrome: Secondary | ICD-10-CM | POA: Diagnosis not present

## 2016-04-20 DIAGNOSIS — Z825 Family history of asthma and other chronic lower respiratory diseases: Secondary | ICD-10-CM

## 2016-04-20 DIAGNOSIS — R059 Cough, unspecified: Secondary | ICD-10-CM

## 2016-04-20 DIAGNOSIS — G8110 Spastic hemiplegia affecting unspecified side: Secondary | ICD-10-CM | POA: Diagnosis not present

## 2016-04-20 DIAGNOSIS — L89152 Pressure ulcer of sacral region, stage 2: Secondary | ICD-10-CM | POA: Diagnosis present

## 2016-04-20 DIAGNOSIS — E875 Hyperkalemia: Secondary | ICD-10-CM | POA: Diagnosis present

## 2016-04-20 DIAGNOSIS — R1310 Dysphagia, unspecified: Secondary | ICD-10-CM | POA: Diagnosis not present

## 2016-04-20 DIAGNOSIS — I82C22 Chronic embolism and thrombosis of left internal jugular vein: Secondary | ICD-10-CM | POA: Diagnosis present

## 2016-04-20 DIAGNOSIS — J398 Other specified diseases of upper respiratory tract: Secondary | ICD-10-CM | POA: Diagnosis present

## 2016-04-20 DIAGNOSIS — K5790 Diverticulosis of intestine, part unspecified, without perforation or abscess without bleeding: Secondary | ICD-10-CM | POA: Diagnosis present

## 2016-04-20 DIAGNOSIS — R0602 Shortness of breath: Secondary | ICD-10-CM | POA: Diagnosis not present

## 2016-04-20 DIAGNOSIS — Z8042 Family history of malignant neoplasm of prostate: Secondary | ICD-10-CM | POA: Diagnosis not present

## 2016-04-20 DIAGNOSIS — E048 Other specified nontoxic goiter: Secondary | ICD-10-CM | POA: Diagnosis not present

## 2016-04-20 DIAGNOSIS — Z800 Family history of malignant neoplasm of digestive organs: Secondary | ICD-10-CM

## 2016-04-20 DIAGNOSIS — G4700 Insomnia, unspecified: Secondary | ICD-10-CM | POA: Diagnosis present

## 2016-04-20 DIAGNOSIS — Z79891 Long term (current) use of opiate analgesic: Secondary | ICD-10-CM

## 2016-04-20 DIAGNOSIS — Z79899 Other long term (current) drug therapy: Secondary | ICD-10-CM

## 2016-04-20 DIAGNOSIS — Z86711 Personal history of pulmonary embolism: Secondary | ICD-10-CM

## 2016-04-20 DIAGNOSIS — R1313 Dysphagia, pharyngeal phase: Secondary | ICD-10-CM | POA: Diagnosis present

## 2016-04-20 DIAGNOSIS — Z7901 Long term (current) use of anticoagulants: Secondary | ICD-10-CM | POA: Diagnosis not present

## 2016-04-20 DIAGNOSIS — Z791 Long term (current) use of non-steroidal anti-inflammatories (NSAID): Secondary | ICD-10-CM

## 2016-04-20 DIAGNOSIS — J989 Respiratory disorder, unspecified: Secondary | ICD-10-CM | POA: Diagnosis not present

## 2016-04-20 DIAGNOSIS — Z8673 Personal history of transient ischemic attack (TIA), and cerebral infarction without residual deficits: Secondary | ICD-10-CM | POA: Diagnosis not present

## 2016-04-20 DIAGNOSIS — Z993 Dependence on wheelchair: Secondary | ICD-10-CM | POA: Diagnosis not present

## 2016-04-20 DIAGNOSIS — R0902 Hypoxemia: Secondary | ICD-10-CM | POA: Diagnosis not present

## 2016-04-20 DIAGNOSIS — L8990 Pressure ulcer of unspecified site, unspecified stage: Secondary | ICD-10-CM | POA: Insufficient documentation

## 2016-04-20 DIAGNOSIS — I69920 Aphasia following unspecified cerebrovascular disease: Secondary | ICD-10-CM | POA: Diagnosis not present

## 2016-04-20 DIAGNOSIS — E049 Nontoxic goiter, unspecified: Secondary | ICD-10-CM | POA: Diagnosis not present

## 2016-04-20 LAB — BASIC METABOLIC PANEL
Anion gap: 6 (ref 5–15)
BUN: 9 mg/dL (ref 6–20)
CO2: 27 mmol/L (ref 22–32)
Calcium: 8.9 mg/dL (ref 8.9–10.3)
Chloride: 107 mmol/L (ref 101–111)
Creatinine, Ser: 0.42 mg/dL — ABNORMAL LOW (ref 0.44–1.00)
GFR calc Af Amer: >60 mL/min (ref >60)
GFR calc non Af Amer: >60 mL/min (ref >60)
Glucose, Bld: 84 mg/dL (ref 65–99)
Potassium: 4.6 mmol/L (ref 3.5–5.1)
Sodium: 140 mmol/L (ref 135–145)

## 2016-04-20 LAB — URINALYSIS, ROUTINE W REFLEX MICROSCOPIC
Bacteria, UA: NONE SEEN
Bilirubin Urine: NEGATIVE
Glucose, UA: NEGATIVE mg/dL
Ketones, ur: NEGATIVE mg/dL
Nitrite: NEGATIVE
Protein, ur: NEGATIVE mg/dL
Specific Gravity, Urine: 1.009 (ref 1.005–1.030)
pH: 8 (ref 5.0–8.0)

## 2016-04-20 LAB — CBC WITH DIFFERENTIAL/PLATELET
Basophils Absolute: 0.1 10*3/uL (ref 0.0–0.1)
Basophils Relative: 1 %
Eosinophils Absolute: 0.2 10*3/uL (ref 0.0–0.7)
Eosinophils Relative: 3 %
HCT: 42 % (ref 36.0–46.0)
Hemoglobin: 13.5 g/dL (ref 12.0–15.0)
Lymphocytes Relative: 25 %
Lymphs Abs: 1.5 10*3/uL (ref 0.7–4.0)
MCH: 35.8 pg — ABNORMAL HIGH (ref 26.0–34.0)
MCHC: 32.1 g/dL (ref 30.0–36.0)
MCV: 111.4 fL — ABNORMAL HIGH (ref 78.0–100.0)
Monocytes Absolute: 0.3 10*3/uL (ref 0.1–1.0)
Monocytes Relative: 6 %
Neutro Abs: 3.7 10*3/uL (ref 1.7–7.7)
Neutrophils Relative %: 65 %
Platelets: 455 10*3/uL — ABNORMAL HIGH (ref 150–400)
RBC: 3.77 MIL/uL — ABNORMAL LOW (ref 3.87–5.11)
RDW: 14.3 % (ref 11.5–15.5)
WBC: 5.8 10*3/uL (ref 4.0–10.5)

## 2016-04-20 LAB — I-STAT CHEM 8, ED
BUN: 10 mg/dL (ref 6–20)
Calcium, Ion: 1.15 mmol/L (ref 1.15–1.40)
Chloride: 104 mmol/L (ref 101–111)
Creatinine, Ser: 0.8 mg/dL (ref 0.44–1.00)
Glucose, Bld: 87 mg/dL (ref 65–99)
HCT: 44 % (ref 36.0–46.0)
Hemoglobin: 15 g/dL (ref 12.0–15.0)
Potassium: 4.3 mmol/L (ref 3.5–5.1)
Sodium: 140 mmol/L (ref 135–145)
TCO2: 29 mmol/L (ref 0–100)

## 2016-04-20 LAB — I-STAT TROPONIN, ED: Troponin i, poc: 0 ng/mL (ref 0.00–0.08)

## 2016-04-20 LAB — STREP PNEUMONIAE URINARY ANTIGEN: Strep Pneumo Urinary Antigen: NEGATIVE

## 2016-04-20 MED ORDER — MORPHINE SULFATE (PF) 4 MG/ML IV SOLN
2.0000 mg | Freq: Once | INTRAVENOUS | Status: AC
  Administered 2016-04-20: 2 mg via INTRAVENOUS
  Filled 2016-04-20: qty 1

## 2016-04-20 MED ORDER — KETOROLAC TROMETHAMINE 15 MG/ML IJ SOLN
15.0000 mg | Freq: Once | INTRAMUSCULAR | Status: AC
  Administered 2016-04-20: 15 mg via INTRAVENOUS
  Filled 2016-04-20: qty 1

## 2016-04-20 MED ORDER — ORAL CARE MOUTH RINSE
15.0000 mL | Freq: Two times a day (BID) | OROMUCOSAL | Status: DC
  Administered 2016-04-21 – 2016-04-28 (×13): 15 mL via OROMUCOSAL

## 2016-04-20 MED ORDER — ENOXAPARIN SODIUM 40 MG/0.4ML ~~LOC~~ SOLN
40.0000 mg | SUBCUTANEOUS | Status: DC
Start: 2016-04-20 — End: 2016-04-20

## 2016-04-20 MED ORDER — PIPERACILLIN-TAZOBACTAM 3.375 G IVPB 30 MIN
3.3750 g | Freq: Once | INTRAVENOUS | Status: AC
  Administered 2016-04-20: 3.375 g via INTRAVENOUS
  Filled 2016-04-20: qty 50

## 2016-04-20 MED ORDER — ACETAMINOPHEN 325 MG PO TABS
650.0000 mg | ORAL_TABLET | Freq: Four times a day (QID) | ORAL | Status: DC | PRN
  Administered 2016-04-23: 650 mg via ORAL
  Filled 2016-04-20: qty 2

## 2016-04-20 MED ORDER — DEXTROSE-NACL 5-0.45 % IV SOLN
INTRAVENOUS | Status: DC
  Administered 2016-04-20 – 2016-04-21 (×2): via INTRAVENOUS

## 2016-04-20 MED ORDER — PIPERACILLIN-TAZOBACTAM 3.375 G IVPB
3.3750 g | Freq: Three times a day (TID) | INTRAVENOUS | Status: DC
  Administered 2016-04-20 – 2016-04-28 (×24): 3.375 g via INTRAVENOUS
  Filled 2016-04-20 (×24): qty 50

## 2016-04-20 MED ORDER — BISACODYL 10 MG RE SUPP: 10.0000 mg | Freq: Every day | RECTAL | Status: DC | PRN

## 2016-04-20 MED ORDER — ACETAMINOPHEN 650 MG RE SUPP: 650.0000 mg | Freq: Four times a day (QID) | RECTAL | Status: DC | PRN

## 2016-04-20 MED ORDER — ONDANSETRON HCL 4 MG PO TABS: 4.0000 mg | ORAL_TABLET | Freq: Four times a day (QID) | ORAL | Status: DC | PRN

## 2016-04-20 MED ORDER — METOPROLOL TARTRATE 5 MG/5ML IV SOLN
2.5000 mg | Freq: Four times a day (QID) | INTRAVENOUS | Status: DC
  Administered 2016-04-20 – 2016-04-27 (×26): 2.5 mg via INTRAVENOUS
  Filled 2016-04-20 (×26): qty 5

## 2016-04-20 MED ORDER — ONDANSETRON HCL 4 MG/2ML IJ SOLN: 4.0000 mg | Freq: Four times a day (QID) | INTRAMUSCULAR | Status: DC | PRN

## 2016-04-20 MED ORDER — CHLORHEXIDINE GLUCONATE 0.12 % MT SOLN
15.0000 mL | Freq: Two times a day (BID) | OROMUCOSAL | Status: DC
  Administered 2016-04-20 – 2016-04-29 (×15): 15 mL via OROMUCOSAL
  Filled 2016-04-20 (×17): qty 15

## 2016-04-20 MED ORDER — ENOXAPARIN SODIUM 80 MG/0.8ML ~~LOC~~ SOLN
80.0000 mg | Freq: Two times a day (BID) | SUBCUTANEOUS | Status: DC
  Administered 2016-04-21 – 2016-04-27 (×13): 80 mg via SUBCUTANEOUS
  Filled 2016-04-20 (×14): qty 0.8

## 2016-04-20 MED ORDER — DICLOFENAC SODIUM 1 % TD GEL
2.0000 g | Freq: Four times a day (QID) | TRANSDERMAL | Status: DC
  Administered 2016-04-20 – 2016-04-29 (×35): 2 g via TOPICAL
  Filled 2016-04-20 (×2): qty 100

## 2016-04-20 MED ORDER — ALBUTEROL SULFATE (2.5 MG/3ML) 0.083% IN NEBU: 3.0000 mL | INHALATION_SOLUTION | RESPIRATORY_TRACT | Status: DC | PRN

## 2016-04-20 NOTE — ED Notes (Signed)
Admitting PA at bedside.

## 2016-04-20 NOTE — ED Notes (Signed)
ED Provider at bedside. 

## 2016-04-20 NOTE — H&P (Signed)
History and Physical    [name] SWF:[id number] DOB: 11/30/1932 DOA: 04/20/2016  PCP: [name], MD   Patient coming from: home  Chief Complaint: cough  HPI: Sandy Barrett is a 83 y.o. female with medical history significant of Hyperthyroidism, HTN, migraine headache, left MCA stroke with residual right sided weakness and aphasia,  PE who presented to the ED with c/o cough that has been going on for more than a week increasing in frequency and wheezing at times  Patient was seen by PCP on 04/13/2016 and diagnosed with CAP and UTI. She was placed on Augmentin, but her symptoms remain persistent and even worsening, so today she came to the ED for evaluation  ED Course: VS were stable on presentation to the ED Blood work was unremarkable, except low creatinine 0.42, low RBC 3.77 and elevated PLT count 455, 000 Chest XRay showed large stable goiter causing tracheal narrowing and deviation to the right, haziness in the lower lung field and blunting of the left costophrenic angle   Review of Systems: As per HPI otherwise 10 point review of systems negative.   Ambulatory Status: Wheelchair and bedridden   Past Medical History:  Diagnosis Date  . ARTHRITIS   . Arthritis   . Diverticulosis   . HYPERTENSION   . HYPERTHYROIDISM   . Hyperthyroidism    s/p I-131 ablation 03/2011 of multinod goiter  . INSOMNIA   . MIGRAINE HEADACHE   . OBESITY   . Posttraumatic stress disorder   . Pulmonary embolism ([place]) 03/2014   with DVT  . Stroke [facility]) 03/2014   dysarthria    Past Surgical History:  Procedure Laterality Date  . ABDOMINAL HYSTERECTOMY  1976  . BREAST SURGERY     biopsy  . RADIOLOGY WITH ANESTHESIA Left 03/08/2014   Procedure: RADIOLOGY WITH ANESTHESIA;  Surgeon: Rob Hickman, MD;  Location: [place];  Service: Radiology;  Laterality: Left;  . TONSILLECTOMY    . TOTAL HIP ARTHROPLASTY  1998    right    Social History   Social History  . Marital status: Widowed     Spouse name: N/A  . Number of children: 2  . Years of education: 14   Occupational History  . retired     Restaurant manager, fast food   Social History Main Topics  . Smoking status: Never Smoker  . Smokeless tobacco: Never Used  . Alcohol use No  . Drug use: No  . Sexual activity: No   Other Topics Concern  . Not on file   Social History Narrative   Widowed, lived alone prior to CVA 03/2014   SNF at [facility], then home 07/2014 with dtr   Right handed   Caffeine use- occasionally drinks tea    No Known Allergies  Family History  Problem Relation Age of Onset  . Asthma Mother   . Asthma Father   . Prostate cancer Father   . Stroke Brother   . Breast cancer Sister   . Stomach cancer Sister   . Thyroid disease Neg Hx     Prior to Admission medications   Medication Sig Start Date End Date Taking? Authorizing Provider  acetaminophen (TYLENOL) 325 MG tablet Take 1-2 tablets (325-650 mg total) by mouth every 4 (four) hours as needed for mild pain. 04/05/14   [name], PA-C  albuterol (PROVENTIL HFA) 108 (90 Base) MCG/ACT inhaler Inhale 2 puffs into the lungs every 6 (six) hours as needed for wheezing or shortness of  breath. 05/15/15   Burnard Hawthorne, FNP  amoxicillin-clavulanate (AUGMENTIN) 875-125 MG tablet Take 1 tablet by mouth 2 (two) times daily. 04/13/16   Delano Metz, FNP  apixaban (ELIQUIS) 2.5 MG TABS tablet Take 1 tablet (2.5 mg total) by mouth 2 (two) times daily. 02/07/16   [name], MD  cephALEXin (KEFLEX) 250 MG capsule  01/29/16   Historical Provider, MD  diclofenac sodium (VOLTAREN) 1 % GEL APPLY (2GMS) TOPICALLY THREE TIMES DAILY. 01/30/16   [name], MD  fluconazole (DIFLUCAN) 150 MG tablet Take 150 mg by mouth as needed.  11/14/15   Historical Provider, MD  gabapentin (NEURONTIN) 100 MG capsule Take 1 capsule (100 mg total) by mouth 3 (three) times daily. Patient taking differently: Take 100 mg by mouth 2 (two) times daily.  08/12/15   [name], MD  hydroxyurea (HYDREA) 500 MG capsule Take 2 capsules (1,000 mg total) by mouth daily. May take with food to minimize GI side effects. Patient taking differently: Take by mouth. Take 1 tab daily 08/23/15   Heath Lark, MD  methimazole (TAPAZOLE) 5 MG tablet TAKE 1 TABLET BY MOUTH TWICE WEEKLY 04/19/16   Renato Shin, MD  polyethylene glycol (MIRALAX / GLYCOLAX) packet TAKE 17G BY MOUTH TWICE A DAY 03/12/16   [name], MD  Probiotic Product (ALIGN) 4 MG CAPS Take 1 capsule by mouth daily.     Historical Provider, MD  propranolol (INDERAL) 10 MG tablet TAKE 1 TABLET TWICE A DAY 12/02/15   [name], MD  PROTONIX 40 MG PACK TAKE 20 MLS (40 MG TOTAL) BY MOUTH DAILY. 02/13/16   [name], MD  terconazole (TERAZOL 7) 0.4 % vaginal cream Place 1 applicator vaginally at bedtime. 04/18/15   Lavonia Drafts, MD  traMADol (ULTRAM) 50 MG tablet TAKE 1 TO 2 TABLETS BY MOUTH 3 TIMES A DAY AS NEEDED 02/07/16   [name], MD  traZODone (DESYREL) 50 MG tablet TAKE 1 TABLET (50 MG TOTAL) BY MOUTH AT BEDTIME AS NEEDED. FOR SLEEP 02/13/16   [name], MD    Physical Exam: Vitals:   04/20/16 1145 04/20/16 1200 04/20/16 1216 04/20/16 1245  BP: 125/90 137/82 (!) 144/76 126/84  Pulse: 92 94 93 93  Resp: (!) 21 18 18 20   Temp:      TempSrc:      SpO2: 92% 93% 92% 94%  Weight:         General: Appears calm and comfortable Eyes: PERRLA, EOMI, normal lids, iris ENT:  grossly normal hearing, lips & tongue, mucous membranes moist and intact Neck: no lymphoadenopathy, masses or thyromegaly Cardiovascular: RRR, 5-2/8 systolic murmur no r/g. No JVD, carotid bruits. No LE edema.  Respiratory: bilateral no wheezes, coarse breath sound posterior lung fields, inspiratory stridor. Normal respiratory effort. No accessory muscle use observed Abdomen: soft, non-tender, non-distended, no organomegaly or masses appreciated. BS present in all quadrants Skin: no rash, ulcers or induration seen on  limited exam Musculoskeletal: grossly normal tone BUE/BLE, good ROM, no bony abnormality or joint deformities observed Psychiatric: grossly normal mood and affect, speech fluent and appropriate, alert and oriented x3 Neurologic: CN II-XII grossly intact, moves all extremities in coordinated fashion, sensation intact  Labs on Admission: I have personally reviewed following labs and imaging studies  CBC, BMP  GFR: Estimated Creatinine Clearance: 51.5 mL/min (by C-G formula based on SCr of 0.8 mg/dL).  Creatinine Clearance: Estimated Creatinine Clearance: 51.5 mL/min (by C-G formula based on  SCr of 0.8 mg/dL).  Radiological Exams on Admission: Dg Chest 2 View  Result Date: 04/20/2016 CLINICAL DATA:  Cough and shortness of breath. EXAM: CHEST  2 VIEW COMPARISON:  04/13/2016 and chest CT on 05/31/2015 FINDINGS: Heart size is stable. Large mediastinal mass resulting in tracheal narrowing and deviation to the right is stable, and consistent with large substernal goiter, as demonstrated on previous CT. No evidence of pulmonary infiltrate or edema. Limited visualization on lateral projection due to position of patient's arms. IMPRESSION: No acute findings. Stable large substernal goiter causing tracheal narrowing and deviation to the right. Electronically Signed   By: Earle Gell M.D.   On: 04/20/2016 10:13    EKG: Independently reviewed - sinus rhythm with non-specific ST-TW changes, poor RW progression  Assessment/Plan Principal Problem:   PNA (pneumonia) Active Problems:   Hyperthyroidism   Essential hypertension   Aphasia S/P CVA   Spastic hemiparesis affecting dominant side ([place]), right   Dysphagia    PNA - most likely aspiration given patient's history of previous stroke with residual dysphasia Patient reports that patient started coughing with almost every meal intake during the last week versus having it happening seldom before Continue zosyn IV, add nebulizer treatments if  needed Obtain speech and swallow evaluation Keep NPO and no oral meds until is seen by speech therapist  Hyperthyroidism Patient has large goiter with tracheal deviation and narrowing Her daughter noticed the patient makes sounds with inspiration at times especially during meals  LAst TSH and freeT4 on 04/01/16 were normal on current dose of Methimasole  Hypertension Currently controlled, continue to monitor and adjust doses if needed  Spastic hemiparesis (right sided) and aphasia after left MCA stroke Provide supportive care, monitor for safety  Patient is on Eliquis for PE, will hold it and start Lovenox  bid  DVT prophylaxis: Heparin Code Status: full Family Communication: daughter at bedside Disposition Plan: Medsurg Consults called: none Admission status: observation   York Grice, [place] Pager: [phone] [facility]  If 7PM-7AM, please contact night-coverage [email or web address] Password [email or web address]  04/20/2016, 1:17 PM

## 2016-04-20 NOTE — ED Notes (Signed)
This RN started a 2nd IV after Left AC infiltrated.  Good blood return, IV blew when flushed.  IV team consult placed.

## 2016-04-20 NOTE — Progress Notes (Signed)
ANTICOAGULATION CONSULT NOTE - Initial Consult  Pharmacy Consult for lovenox Indication: pulmonary embolus  No Known Allergies  Patient Measurements: Weight: 176 lb (79.8 kg)  Vital Signs: Temp: 98.7 F (37.1 C) (04/16 0937) Temp Source: Oral (04/16 0937) BP: 126/84 (04/16 1245) Pulse Rate: 97 (04/16 1315)  Labs:  Recent Labs  04/20/16 0945 04/20/16 0949  HGB 13.5 15.0  HCT 42.0 44.0  PLT 455*  --   CREATININE 0.42* 0.80    Estimated Creatinine Clearance: 51.5 mL/min (by C-G formula based on SCr of 0.8 mg/dL).   Medical History: Past Medical History:  Diagnosis Date  . ARTHRITIS   . Arthritis   . Diverticulosis   . HYPERTENSION   . HYPERTHYROIDISM   . Hyperthyroidism    s/p I-131 ablation 03/2011 of multinod goiter  . INSOMNIA   . MIGRAINE HEADACHE   . OBESITY   . Posttraumatic stress disorder   . Pulmonary embolism ([place]) 03/2014   with DVT  . Stroke [facility]) 03/2014   dysarthria   Assessment: 69 yof presented to the ED with cough. She is on chronic apixaban for history of PE with last dose of last night. To be transitioned to lovenox while pt is NPO. Baseline H/H is WNL and platelets elevated.   Goal of Therapy:  Anti-Xa level 0.6-1 units/ml 4hrs after LMWH dose given Monitor platelets by anticoagulation protocol: Yes   Plan:  Lovenox 80mg  SQ Q12H CBC Q72H F/u ability to restart apixaban  Tashaun Obey, Rande Lawman 04/20/2016,2:26 PM

## 2016-04-20 NOTE — ED Triage Notes (Signed)
Pt arrives from home via [facility] c/o cough and increased congestion overnight.  Pt's daughter reports pt currently taking augmentin for suspected PNA and possible UTI. Pt denies CP.  Pt's daughter reports R sided weakness and aphasia unchanged from previous CTA, denies AMS at this time.

## 2016-04-20 NOTE — ED Notes (Signed)
Attempted report x 2 

## 2016-04-20 NOTE — ED Notes (Signed)
Phlebotomy at bedside.

## 2016-04-20 NOTE — ED Notes (Signed)
Per phlebotomy unable to draw labs. This nurse will also attempt to draw pt labs.

## 2016-04-20 NOTE — ED Notes (Signed)
Attempted report x1. 

## 2016-04-20 NOTE — ED Provider Notes (Signed)
[facility] DEPT Provider Note   CSN: [id number] Arrival date & time: 04/20/16  2542     History   Chief Complaint Chief Complaint  Patient presents with  . Cough    HPI [name] Politano is a 83 y.o. female.  83 yo F with a cc of a cough, going on for past week. Patient is aphasic and unable to provide any other history. Level V caveat aphasia. Per the daughter the patient has been coughing for about a week. Was seen in the PCPs office and diagnosed presumptively with pneumonia and started on Augmentin. She had a chest x-ray that was negative for ammonia, but incidentally found that her goiter was larger. Family said that she's been having some worsening issues with swallowing and has been checking more often on her meals. Having more frequent coughing. Does not think that her mother is in pain.   The history is provided by a relative. History limited by: aphasia.  Illness  This is a new problem. The current episode started less than 1 hour ago. The problem occurs constantly. The problem has not changed since onset.Pertinent negatives include no chest pain, no headaches and no shortness of breath. Nothing aggravates the symptoms. Nothing relieves the symptoms. She has tried nothing for the symptoms. The treatment provided no relief.    Past Medical History:  Diagnosis Date  . ARTHRITIS   . Arthritis   . Diverticulosis   . HYPERTENSION   . HYPERTHYROIDISM   . Hyperthyroidism    s/p I-131 ablation 03/2011 of multinod goiter  . INSOMNIA   . MIGRAINE HEADACHE   . OBESITY   . Posttraumatic stress disorder   . Pulmonary embolism ([place]) 03/2014   with DVT  . Stroke [facility]) 03/2014   dysarthria    Patient Active Problem List   Diagnosis Date Noted  . PNA (pneumonia) 04/20/2016  . Wheelchair bound 02/03/2016  . Conjunctival hemorrhage of right eye 01/28/2016  . Ecchymosis 01/28/2016  . Vaginal bleeding 01/28/2016  . Gross hematuria 01/28/2016  . Supratherapeutic INR  01/28/2016  . Excessive cerumen in ear canal, bilateral 12/31/2015  . Leg swelling 12/31/2015  . Discoloration of skin 09/26/2015  . Dermatitis 09/13/2015  . Essential thrombocytosis ([place]) 07/31/2015  . SVC (superior vena cava obstruction), chronic 07/20/2015  . Dysphagia 07/20/2015  . Elevated liver enzymes 07/20/2015  . Constipation 07/20/2015  . Thrombophlebitis of breast, right 06/18/2015  . Tracheal deviation 06/06/2015  . Sensorineural hearing loss, bilateral, moderate-moderately severe 04/30/2015  . Abdominal wall lump 03/09/2015  . Frequent UTI 02/06/2015  . Primary osteoarthritis involving multiple joints 07/26/2014  . Pernicious anemia 07/26/2014  . Hearing loss 07/26/2014  . Encounter for therapeutic drug monitoring 07/17/2014  . Spastic hemiparesis affecting dominant side ([place]), right 05/31/2014  . DVT of lower extremity, bilateral ([place]) 03/14/2014  . Global aphasia 03/14/2014  . 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 Aphasia S/P CVA 03/13/2014  . Cerebral infarction due to embolism of left middle cerebral artery ([place])   . Stroke, embolic ([place]) 70/62/3762  . History of pulmonary embolism 03/08/2014  . Back pain 08/29/2013  . Primary localized osteoarthrosis, lower leg 03/06/2013  . IBS (irritable bowel syndrome)   . Multinodular goiter 01/13/2011  . Hyperthyroidism 11/11/2009  . Insomnia 07/03/2009  . POSTTRAUMATIC STRESS DISORDER 08/22/2008  . OBESITY 04/21/2008  . Osteoarthritis 04/21/2008  . MIGRAINE HEADACHE 04/20/2008  . Essential hypertension 04/20/2008    Past Surgical History:  Procedure Laterality Date  . ABDOMINAL HYSTERECTOMY  [place]     biopsy  . RADIOLOGY WITH ANESTHESIA Left 03/08/2014   Procedure: RADIOLOGY WITH ANESTHESIA;  Surgeon: Rob Hickman, MD;  Location: [facility];  Service: Radiology;  Laterality: Left;  . TONSILLECTOMY    . TOTAL HIP ARTHROPLASTY  1998    right    OB History    No data available       Home  Medications    Prior to Admission medications   Medication Sig Start Date End Date Taking? Authorizing Provider  acetaminophen (TYLENOL) 160 MG/5ML liquid Take 480 mg by mouth every 4 (four) hours as needed for fever or pain.   Yes Historical Provider, MD  albuterol (PROVENTIL HFA) 108 (90 Base) MCG/ACT inhaler Inhale 2 puffs into the lungs every 6 (six) hours as needed for wheezing or shortness of breath. 05/15/15  Yes Burnard Hawthorne, FNP  amoxicillin-clavulanate (AUGMENTIN) 875-125 MG tablet Take 1 tablet by mouth 2 (two) times daily. 04/13/16  Yes Delano Metz, FNP  apixaban (ELIQUIS) 2.5 MG TABS tablet Take 1 tablet (2.5 mg total) by mouth 2 (two) times daily. 02/07/16  Yes [name], MD  cephALEXin (KEFLEX) 250 MG capsule Take 250 mg by mouth daily. UTI Prevention 01/29/16  Yes Historical Provider, MD  diclofenac sodium (VOLTAREN) 1 % GEL APPLY (2GMS) TOPICALLY THREE TIMES DAILY. 01/30/16  Yes [name], MD  fluconazole (DIFLUCAN) 150 MG tablet Take 150 mg by mouth as needed.  11/14/15  Yes Historical Provider, MD  gabapentin (NEURONTIN) 100 MG capsule Take 1 capsule (100 mg total) by mouth 3 (three) times daily. Patient taking differently: Take 100 mg by mouth 2 (two) times daily.  08/12/15  Yes [name], MD  hydroxyurea (HYDREA) 500 MG capsule Take 2 capsules (1,000 mg total) by mouth daily. May take with food to minimize GI side effects. Patient taking differently: Take 500 mg by mouth daily.  08/23/15  Yes Heath Lark, MD  methimazole (TAPAZOLE) 5 MG tablet TAKE 1 TABLET BY MOUTH TWICE WEEKLY Patient taking differently: TAKE 1 TABLET BY MOUTH TWICE WEEKLY (Sun and Thurs) 04/19/16  Yes Renato Shin, MD  polyethylene glycol (MIRALAX / GLYCOLAX) packet TAKE 17G BY MOUTH TWICE A DAY Patient taking differently: TAKE 17G BY MOUTH TWICE A DAY as needed 03/12/16  Yes [name], MD  Probiotic Product (ALIGN) 4 MG CAPS Take 1 capsule by mouth daily.    Yes Historical Provider, MD    propranolol (INDERAL) 10 MG tablet TAKE 1 TABLET TWICE A DAY Patient taking differently: TAKE 1 TABLET once A DAY 12/02/15  Yes [name], MD  PROTONIX 40 MG PACK TAKE 20 MLS (40 MG TOTAL) BY MOUTH DAILY. Patient taking differently: Take 40 mg by mouth daily. Mix with applesauce 02/13/16  Yes [name], MD  terconazole (TERAZOL 7) 0.4 % vaginal cream Place 1 applicator vaginally at bedtime. Patient taking differently: Place 1 applicator vaginally at bedtime as needed (irritation).  04/18/15  Yes Lavonia Drafts, MD  traMADol (ULTRAM) 50 MG tablet TAKE 1 TO 2 TABLETS BY MOUTH 3 TIMES A DAY AS NEEDED Patient taking differently: TAKE 2 TABLETS BY MOUTH 3 TIMES A DAY AS NEEDED 02/07/16  Yes [name], MD  traZODone (DESYREL) 50 MG tablet TAKE 1 TABLET (50 MG TOTAL) BY MOUTH AT BEDTIME AS NEEDED. FOR SLEEP 02/13/16  Yes [name], MD    Family History Family History  Problem Relation Age of Onset  .  Asthma Mother   . Asthma Father   . Prostate cancer Father   . Stroke Brother   . Breast cancer Sister   . Stomach cancer Sister   . Thyroid disease Neg Hx     Social History Social History  Substance Use Topics  . Smoking status: Never Smoker  . Smokeless tobacco: Never Used  . Alcohol use No     Allergies   Patient has no known allergies.   Review of Systems Review of Systems  Constitutional: Positive for diaphoresis. Negative for chills and fever.       Obese  HENT: Positive for congestion. Negative for rhinorrhea.   Eyes: Negative for redness and visual disturbance.  Respiratory: Positive for cough. Negative for shortness of breath and wheezing.   Cardiovascular: Negative for chest pain and palpitations.  Gastrointestinal: Negative for nausea and vomiting.  Genitourinary: Negative for dysuria and urgency.  Musculoskeletal: Negative for arthralgias and myalgias.  Skin: Negative for pallor and wound.  Neurological: Negative for dizziness and headaches.      Physical Exam Updated Vital Signs BP (!) 133/113 Comment: Pt lying on side  Pulse 95   Temp 98.7 F (37.1 C) (Oral)   Resp (!) 21   Wt 176 lb (79.8 kg)   SpO2 93%   BMI 34.37 kg/m   Physical Exam  Constitutional: She is oriented to person, place, and time. She appears well-developed and well-nourished. No distress.  HENT:  Head: Normocephalic and atraumatic.  Swollen turbinates  Eyes: EOM are normal. Pupils are equal, round, and reactive to light.  Neck: Normal range of motion. Neck supple.  Cardiovascular: Normal rate and regular rhythm.  Exam reveals no gallop and no friction rub.   No murmur heard. Pulmonary/Chest: Effort normal. She has no wheezes. She has no rales.  Large chest wall, distant breath sounds diffusely  Abdominal: Soft. She exhibits no distension and no mass. There is no tenderness. There is no guarding.  Musculoskeletal: She exhibits no edema or tenderness.  Neurological: She is alert and oriented to person, place, and time.  Skin: Skin is warm and dry. She is not diaphoretic.  Psychiatric: She has a normal mood and affect. Her behavior is normal.  Nursing note and vitals reviewed.    ED Treatments / Results  Labs (all labs ordered are listed, but only abnormal results are displayed) Labs Reviewed  CBC WITH DIFFERENTIAL/PLATELET - Abnormal; Notable for the following:       Result Value   RBC 3.77 (*)    MCV 111.4 (*)    MCH 35.8 (*)    Platelets 455 (*)    All other components within normal limits  BASIC METABOLIC PANEL - Abnormal; Notable for the following:    Creatinine, Ser 0.42 (*)    All other components within normal limits  CULTURE, BLOOD (ROUTINE X 2)  CULTURE, BLOOD (ROUTINE X 2)  CULTURE, EXPECTORATED SPUTUM-ASSESSMENT  GRAM STAIN  URINALYSIS, ROUTINE W REFLEX MICROSCOPIC  HIV ANTIBODY (ROUTINE TESTING)  STREP PNEUMONIAE URINARY ANTIGEN  I-STAT CHEM 8, ED  I-STAT TROPOININ, ED    EKG  EKG  Interpretation  Date/Time:  Monday April 20 2016 09:34:57 EDT Ventricular Rate:  88 PR Interval:    QRS Duration: 73 QT Interval:  350 QTC Calculation: 421 R Axis:   48 Text Interpretation:  Sinus rhythm Probable lateral infarct, old Baseline wander in lead(s) V5 V6 biphasic t waves in lateral leads resolved Otherwise no significant change Confirmed by Eulamae Greenstein MD, DANIEL (  [id number]) on 04/20/2016 9:51:25 AM       Radiology Dg Chest 2 View  Result Date: 04/20/2016 CLINICAL DATA:  Cough and shortness of breath. EXAM: CHEST  2 VIEW COMPARISON:  04/13/2016 and chest CT on 05/31/2015 FINDINGS: Heart size is stable. Large mediastinal mass resulting in tracheal narrowing and deviation to the right is stable, and consistent with large substernal goiter, as demonstrated on previous CT. No evidence of pulmonary infiltrate or edema. Limited visualization on lateral projection due to position of patient's arms. IMPRESSION: No acute findings. Stable large substernal goiter causing tracheal narrowing and deviation to the right. Electronically Signed   By: Earle Gell M.D.   On: 04/20/2016 10:13    Procedures Procedures (including critical care time)  Medications Ordered in ED Medications  albuterol (PROVENTIL) (2.5 MG/3ML) 0.083% nebulizer solution 3 mL (not administered)  diclofenac sodium (VOLTAREN) 1 % transdermal gel 2 g (not administered)  metoprolol (LOPRESSOR) injection 2.5 mg (not administered)  dextrose 5 %-0.45 % sodium chloride infusion (not administered)  acetaminophen (TYLENOL) tablet 650 mg (not administered)    Or  acetaminophen (TYLENOL) suppository 650 mg (not administered)  bisacodyl (DULCOLAX) suppository 10 mg (not administered)  ondansetron (ZOFRAN) tablet 4 mg (not administered)    Or  ondansetron (ZOFRAN) injection 4 mg (not administered)  enoxaparin (LOVENOX) injection 80 mg (not administered)  piperacillin-tazobactam (ZOSYN) IVPB 3.375 g (3.375 g Intravenous New Bag/Given  04/20/16 1459)  morphine 4 MG/ML injection 2 mg (2 mg Intravenous Given 04/20/16 1411)     Initial Impression / Assessment and Plan / ED Course  I have reviewed the triage vital signs and the nursing notes.  Pertinent labs & imaging results that were available during my care of the patient were reviewed by me and considered in my medical decision making (see chart for details).     83 yo F With a chief complaint of cough. Going on for the past week. Antibiotics did not improved or symptoms. No noted hypoxia on initial exam. Difficult to also fit lungs due to body habitus. Will obtain labs chest x-ray UA reassess.  Chest x-ray read by radiology as negative. On my read question RLL opacity consistent with aspiration. Patient continues to be symptomatic with persistent coughing and gurgling. O2 sat is in the upper 80s to low 90s on room air. Patient clinically appears to have aspiration pneumonia. Will start on Zosyn. Discussed with hospitalist for possible admission.  The patients results and plan were reviewed and discussed.   Any x-rays performed were independently reviewed by myself.   Differential diagnosis were considered with the presenting HPI.  Medications  albuterol (PROVENTIL) (2.5 MG/3ML) 0.083% nebulizer solution 3 mL (not administered)  diclofenac sodium (VOLTAREN) 1 % transdermal gel 2 g (not administered)  metoprolol (LOPRESSOR) injection 2.5 mg (not administered)  dextrose 5 %-0.45 % sodium chloride infusion (not administered)  acetaminophen (TYLENOL) tablet 650 mg (not administered)    Or  acetaminophen (TYLENOL) suppository 650 mg (not administered)  bisacodyl (DULCOLAX) suppository 10 mg (not administered)  ondansetron (ZOFRAN) tablet 4 mg (not administered)    Or  ondansetron (ZOFRAN) injection 4 mg (not administered)  enoxaparin (LOVENOX) injection 80 mg (not administered)  piperacillin-tazobactam (ZOSYN) IVPB 3.375 g (3.375 g Intravenous New Bag/Given 04/20/16 1459)   morphine 4 MG/ML injection 2 mg (2 mg Intravenous Given 04/20/16 1411)    Vitals:   04/20/16 1345 04/20/16 1400 04/20/16 1415 04/20/16 1445  BP: (!) 122/100 (!) 133/113    Pulse: 95  97 94 95  Resp: 18 (!) 22 19 (!) 21  Temp:      TempSrc:      SpO2: 94% 92% 94% 93%  Weight:        Final diagnoses:  Aspiration pneumonia of right lower lobe due to gastric secretions [facility])    Admission/ observation were discussed with the admitting physician, patient and/or family and they are comfortable with the plan.    Final Clinical Impressions(s) / ED Diagnoses   Final diagnoses:  Aspiration pneumonia of right lower lobe due to gastric secretions [facility])    New Prescriptions New Prescriptions   No medications on file     Deno Etienne, DO 04/20/16 1543

## 2016-04-21 ENCOUNTER — Inpatient Hospital Stay (HOSPITAL_COMMUNITY): Payer: Medicare Other

## 2016-04-21 DIAGNOSIS — R1310 Dysphagia, unspecified: Secondary | ICD-10-CM

## 2016-04-21 LAB — BASIC METABOLIC PANEL
Anion gap: 10 (ref 5–15)
Anion gap: 12 (ref 5–15)
BUN: 8 mg/dL (ref 6–20)
BUN: 5 mg/dL — ABNORMAL LOW (ref 6–20)
CO2: 22 mmol/L (ref 22–32)
CO2: 24 mmol/L (ref 22–32)
Calcium: 8.9 mg/dL (ref 8.9–10.3)
Calcium: 8.9 mg/dL (ref 8.9–10.3)
Chloride: 107 mmol/L (ref 101–111)
Chloride: 106 mmol/L (ref 101–111)
Creatinine, Ser: 0.43 mg/dL — ABNORMAL LOW (ref 0.44–1.00)
Creatinine, Ser: 0.42 mg/dL — ABNORMAL LOW (ref 0.44–1.00)
GFR calc Af Amer: >60 mL/min (ref >60)
GFR calc Af Amer: >60 mL/min (ref >60)
GFR calc non Af Amer: >60 mL/min (ref >60)
GFR calc non Af Amer: >60 mL/min (ref >60)
Glucose, Bld: 74 mg/dL (ref 65–99)
Glucose, Bld: 110 mg/dL — ABNORMAL HIGH (ref 65–99)
Potassium: 6.1 mmol/L — ABNORMAL HIGH (ref 3.5–5.1)
Potassium: 4.2 mmol/L (ref 3.5–5.1)
Sodium: 139 mmol/L (ref 135–145)
Sodium: 142 mmol/L (ref 135–145)

## 2016-04-21 LAB — CBC
HCT: 43.1 % (ref 36.0–46.0)
Hemoglobin: 13.9 g/dL (ref 12.0–15.0)
MCH: 35.9 pg — ABNORMAL HIGH (ref 26.0–34.0)
MCHC: 32.3 g/dL (ref 30.0–36.0)
MCV: 111.4 fL — ABNORMAL HIGH (ref 78.0–100.0)
Platelets: 435 10*3/uL — ABNORMAL HIGH (ref 150–400)
RBC: 3.87 MIL/uL (ref 3.87–5.11)
RDW: 14.8 % (ref 11.5–15.5)
WBC: 5.6 10*3/uL (ref 4.0–10.5)

## 2016-04-21 LAB — HIV ANTIBODY (ROUTINE TESTING W REFLEX): HIV Screen 4th Generation wRfx: NONREACTIVE

## 2016-04-21 MED ORDER — TRAZODONE HCL 50 MG PO TABS
50.0000 mg | ORAL_TABLET | Freq: Every evening | ORAL | Status: DC | PRN
  Administered 2016-04-21 – 2016-04-28 (×4): 50 mg via ORAL
  Filled 2016-04-21 (×5): qty 1

## 2016-04-21 MED ORDER — TRAMADOL HCL 50 MG PO TABS
50.0000 mg | ORAL_TABLET | Freq: Four times a day (QID) | ORAL | Status: DC | PRN
  Administered 2016-04-21 – 2016-04-29 (×20): 50 mg via ORAL
  Filled 2016-04-21 (×20): qty 1

## 2016-04-21 MED ORDER — IOPAMIDOL (ISOVUE-300) INJECTION 61%
INTRAVENOUS | Status: AC
  Administered 2016-04-21: 75 mL
  Filled 2016-04-21: qty 75

## 2016-04-21 MED ORDER — SODIUM POLYSTYRENE SULFONATE 15 GM/60ML PO SUSP
30.0000 g | Freq: Once | ORAL | Status: AC
  Administered 2016-04-21: 30 g via ORAL
  Filled 2016-04-21: qty 120

## 2016-04-21 NOTE — Progress Notes (Addendum)
[facility] PROGRESS NOTE  Sandy Barrett SNK:[id number] DOB: November 17, 1932 DOA: 04/20/2016   PCP: [name], MD     Assessment/Plan: Principal Problem:   PNA (pneumonia) Active Problems:   Hyperthyroidism   Essential hypertension   Aphasia S/P CVA   Spastic hemiparesis affecting dominant side ([place]), right   Dysphagia   Aspiration pneumonia ([place])  83 y.o. female with medical history significant of Hyperthyroidism, HTN, migraine headache, left MCA stroke with residual right sided weakness and aphasia 2016,  PE who presented to the ED with c/o cough that has been going on for more than a week . Treated by PCP for community-acquired pneumonia with Augmentin without improvement.Chest XRay showed large stable goiter causing tracheal narrowing and deviation to the right, haziness in the lower lung field and blunting of the left costophrenic angle  Assessment and plan PNA - most likely aspiration given patient's history of previous stroke with residual dysphasia, multinodular goiter  CT neck 07/01/15 showed severe stable thyromegaly, chronic venous obstruction/SVC syndrome. 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 Patient has never had a thyroid biopsy according to Dr. Renato Shin, MD note from 04/01/16. Could this thyroid mass being malignant Continue zosyn IV, nebulizer treatment  speech and swallow evaluation -dys 3 Repeat CT soft tissue neck,  Shows only slight interval growth of large goiter, discussed with Dr Renato Shin, she is not a surgical candidate based on her CVA in 2016, may need RAI as outpatient ,  No clear explanation for SVC syndrome, may benefit from outpatient CTS evaluation    Hyperkalemia-will receive one dose of Kayexalate  Hyperthyroidism Patient has large goiter with tracheal deviation and narrowing, noted results of last CT as above LAst TSH and freeT4 on 04/01/16 were normal on current dose of Methimazole [no evidence of  neutropenia]  Hypertension Currently controlled, continue to monitor and adjust doses if needed  Spastic hemiparesis (right sided) and aphasia after left MCA stroke Provide supportive care, monitor for safety patient developed acute stroke, DVT and PE in 2016.  Patient is on Eliquis for PE, currently on Lovenox  bid, resume eliquis when ability to swallow is confirmed  Thrombocytosis  Followed by Dr Waldon Merl    DVT prophylaxsis Lovenox  Code Status:    Full code  Family Communication: Discussed in detail with the patient's daughter, all imaging results, lab results explained to the patient   Disposition Plan: Continue treatment for pneumonia, PT OT evaluation     Consultants:  None  Procedures:  None  Antibiotics: Anti-infectives    Start     Dose/Rate Route Frequency Ordered Stop   04/20/16 2359  piperacillin-tazobactam (ZOSYN) IVPB 3.375 g     3.375 g 12.5 mL/hr over 240 Minutes Intravenous Every 8 hours 04/20/16 2341     04/20/16 1230  piperacillin-tazobactam (ZOSYN) IVPB 3.375 g     3.375 g 100 mL/hr over 30 Minutes Intravenous  Once 04/20/16 1227 04/20/16 1713         HPI/Subjective: Very hoarse voice , noted cough  Objective: Vitals:   04/20/16 2118 04/20/16 2353 04/21/16 0544 04/21/16 0544  BP: 134/79 (!) 142/129 (!) 156/78 (!) 153/85  Pulse: 94  89 92  Resp: 18  18   Temp: 98.2 F (36.8 C)  97.8 F (36.6 C)   TempSrc: Oral     SpO2: 92%  94% 94%  Weight:        Intake/Output Summary (Last 24 hours) at 04/21/16 0903 Last  data filed at 04/21/16 0600  Gross per 24 hour  Intake          1038.75 ml  Output                0 ml  Net          1038.75 ml    Exam:  Examination:  General exam: Appears calm and comfortable  Respiratory system: Clear to auscultation. Respiratory effort normal. Cardiovascular system: S1 & S2 heard, RRR. No JVD, murmurs, rubs, gallops or clicks. No pedal edema. Gastrointestinal system: Abdomen is  nondistended, soft and nontender. No organomegaly or masses felt. Normal bowel sounds heard. Central nervous system: Alert and oriented. No focal neurological deficits. Extremities: Symmetric 5 x 5 power. Skin: No rashes, lesions or ulcers Psychiatry: Judgement and insight appear normal. Mood & affect appropriate.     Data Reviewed: I have personally reviewed following labs and imaging studies  Micro Results Recent Results (from the past 240 hour(s))  Culture, blood (routine x 2) Call MD if unable to obtain prior to antibiotics being given     Status: None (Preliminary result)   Collection Time: 04/20/16  7:37 PM  Result Value Ref Range Status   Specimen Description BLOOD RIGHT HAND  Final   Special Requests IN PEDIATRIC BOTTLE Blood Culture adequate volume  Final   Culture PENDING  Incomplete   Report Status PENDING  Incomplete    Radiology Reports Dg Chest 1 View  Result Date: 04/14/2016 CLINICAL DATA:  Chest congestion, cough, dysphagia for the past 5 days. History previous CVA, pulmonary embolism. Patient unable to raise the chin above the chest. EXAM: CHEST 1 VIEW COMPARISON:  Portable chest x-ray of May 31, 2015 and chest CT scan of May 31, 2015. FINDINGS: Again demonstrated is a large soft tissue mass which deviates the trachea to the right most compatible with a goiter which is been demonstrated on previous chest x-ray and CT scan. The lungs are well-expanded. There are stable calcifications in the left upper hemithorax which lie in the soft tissues superficial to the scapula on the previous CT scan. The cardiac silhouette remains enlarged. The pulmonary vascularity is not engorged. There is calcification in the wall of the aortic arch. There is degenerative change of both shoulders. IMPRESSION: No acute pneumonia nor pulmonary edema.  Stable mild cardiomegaly. Thoracic aortic atherosclerosis. Large goiter with deviation of the trachea toward the right with narrowing of the tracheal  lumen. This has progressed somewhat since the previous study. Electronically Signed   By: David  Martinique M.D.   On: 04/14/2016 08:28   Dg Chest 2 View  Result Date: 04/20/2016 CLINICAL DATA:  Cough and shortness of breath. EXAM: CHEST  2 VIEW COMPARISON:  04/13/2016 and chest CT on 05/31/2015 FINDINGS: Heart size is stable. Large mediastinal mass resulting in tracheal narrowing and deviation to the right is stable, and consistent with large substernal goiter, as demonstrated on previous CT. No evidence of pulmonary infiltrate or edema. Limited visualization on lateral projection due to position of patient's arms. IMPRESSION: No acute findings. Stable large substernal goiter causing tracheal narrowing and deviation to the right. Electronically Signed   By: Earle Gell M.D.   On: 04/20/2016 10:13     CBC  Recent Labs Lab 04/20/16 0945 04/20/16 0949 04/21/16 0614  WBC 5.8  --  5.6  HGB 13.5 15.0 13.9  HCT 42.0 44.0 43.1  PLT 455*  --  435*  MCV 111.4*  --  111.4*  MCH 35.8*  --  35.9*  MCHC 32.1  --  32.3  RDW 14.3  --  14.8  LYMPHSABS 1.5  --   --   MONOABS 0.3  --   --   EOSABS 0.2  --   --   BASOSABS 0.1  --   --     Chemistries   Recent Labs Lab 04/20/16 0945 04/20/16 0949 04/21/16 0614  NA 140 140 139  K 4.6 4.3 6.1*  CL 107 104 107  CO2 27  --  22  GLUCOSE 84 87 74  BUN 9 10 8   CREATININE 0.42* 0.80 0.43*  CALCIUM 8.9  --  8.9   ------------------------------------------------------------------------------------------------------------------ estimated creatinine clearance is 51.5 mL/min (A) (by C-G formula based on SCr of 0.43 mg/dL (L)). ------------------------------------------------------------------------------------------------------------------ No results for input(s): HGBA1C in the last 72 hours. ------------------------------------------------------------------------------------------------------------------ No results for input(s): CHOL, HDL, LDLCALC,  TRIG, CHOLHDL, LDLDIRECT in the last 72 hours. ------------------------------------------------------------------------------------------------------------------ No results for input(s): TSH, T4TOTAL, T3FREE, THYROIDAB in the last 72 hours.  Invalid input(s): FREET3 ------------------------------------------------------------------------------------------------------------------ No results for input(s): VITAMINB12, FOLATE, FERRITIN, TIBC, IRON, RETICCTPCT in the last 72 hours.  Coagulation profile No results for input(s): INR, PROTIME in the last 168 hours.  No results for input(s): DDIMER in the last 72 hours.  Cardiac Enzymes No results for input(s): CKMB, TROPONINI, MYOGLOBIN in the last 168 hours.  Invalid input(s): CK ------------------------------------------------------------------------------------------------------------------ Invalid input(s): POCBNP   CBG: No results for input(s): GLUCAP in the last 168 hours.     Studies: Dg Chest 2 View  Result Date: 04/20/2016 CLINICAL DATA:  Cough and shortness of breath. EXAM: CHEST  2 VIEW COMPARISON:  04/13/2016 and chest CT on 05/31/2015 FINDINGS: Heart size is stable. Large mediastinal mass resulting in tracheal narrowing and deviation to the right is stable, and consistent with large substernal goiter, as demonstrated on previous CT. No evidence of pulmonary infiltrate or edema. Limited visualization on lateral projection due to position of patient's arms. IMPRESSION: No acute findings. Stable large substernal goiter causing tracheal narrowing and deviation to the right. Electronically Signed   By: Earle Gell M.D.   On: 04/20/2016 10:13      Lab Results  Component Value Date   HGBA1C 5.4 03/09/2014   Lab Results  Component Value Date   LDLCALC 53 03/09/2014   CREATININE 0.43 (L) 04/21/2016       Scheduled Meds: . chlorhexidine  15 mL Mouth Rinse BID  . diclofenac sodium  2 g Topical QID  . enoxaparin (LOVENOX)  injection  80 mg Subcutaneous Q12H  . mouth rinse  15 mL Mouth Rinse q12n4p  . metoprolol  2.5 mg Intravenous Q6H  . piperacillin-tazobactam (ZOSYN)  IV  3.375 g Intravenous Q8H  . sodium polystyrene  30 g Oral Once   Continuous Infusions: . dextrose 5 % and 0.45% NaCl 75 mL/hr at 04/21/16 0534     LOS: 1 day    Time spent: >30 MINS    Reyne Dumas  [facility] Pager [phone]. If 7PM-7AM, please contact night-coverage at [email or web address], password [facility] 04/21/2016, 9:03 AM  LOS: 1 day

## 2016-04-21 NOTE — Consult Note (Signed)
[facility] CM Primary Care Navigator  04/21/2016  [name] G[place] [birth year] [id number]   Met with patient and daughter [name]) at the bedside to identify possible discharge needs. Daughter reports that persistent coughing and chest congestion had led to this admission.  Patient is aphasic and wheelchair bound according to daughter. Patient's daughter endorses Dr. Billey Gosling with [facility]  as the primary care provider.    Daughter shared using [facility] at [facility] to obtain medications without any problem.   Patient's daughter [name]) manages medications for patient at home using "pill box" system weekly.   Daughter-Teresa provides transportation to her doctors' appointments (on handicap [name]) and serves as the primary caregiver for patient at home.   Discharge plan is not yet determined per daughter, with pending therapy recommendation and physician order.  Patient's daughter voiced understanding to call primary care provider's office when patient returns home, for a post discharge follow-up appointment within a week or sooner if needs arise. Patient letter (with PCP's contact number) was provided as a reminder.  Daughter communicated no further health management needs or concerns at this time.  For additional questions please contact:  Edwena Felty A. Shiheem Corporan, BSN, RN-BC [facility] PRIMARY CARE Navigator Cell: [phone]

## 2016-04-21 NOTE — Progress Notes (Signed)
Modified Barium Swallow Progress Note  Patient Details  Name: Sandy Barrett MRN: [id number] Date of Birth: [birth year]  Today's Date: 04/21/2016  Modified Barium Swallow completed.  Full report located under Chart Review in the Imaging Section.  Brief recommendations include the following:  Clinical Impression  Sandy Barrett presents with a mild oral/pharyngeal dysphagia, suspect primary esophageal dysphagia. Oral phase impairments included mild prolonged mastication with regular solids (pt wears partial dentures). Observed mild vallecular and pyriform sinus residue with thin liquids via cup/straw due to reduced laryngeal elevation and epiglottic inversion. MBS does not diagnose below the level of the UES however scan of esophagus revealed stasis within the mid-esophagus and questionable stenosis. Pt previously diagnosed with a substernal goiter causing tracheal narrowing and deviation to the right possible causing observations. Educated pt re: results of MBS, diet recommendations, esophageal precautions (alternate solids/liquids, sit upright after meals), and advised pt to consider GI consult due to esophageal findings. Recommend Dys 3 solids, thin liquids (straws ok), meds crushed. ST will f/u briefly to assess diet tolerance and for continued education.   Swallow Evaluation Recommendations   Recommended Consults: Consider GI evaluation;Consider esophageal assessment   SLP Diet Recommendations: Dysphagia 3 (Mech soft) solids;Thin liquid   Liquid Administration via: Cup;Straw   Medication Administration: Crushed with puree   Supervision: Intermittent supervision to cue for compensatory strategies;Staff to assist with self feeding   Compensations: Slow rate;Small sips/bites;Follow solids with liquid   Postural Changes: Seated upright at 90 degrees   Oral Care Recommendations: Oral care BID        Houston Siren 04/21/2016,2:50 PM  Orbie Pyo [place].Ed [employer]  [phone]

## 2016-04-21 NOTE — Evaluation (Signed)
Clinical/Bedside Swallow Evaluation Patient Details  Name: Sandy Barrett MRN: [id number] Date of Birth: [birth year]  Today's Date: 04/21/2016 Time: SLP Start Time (ACUTE ONLY): 0900 SLP Stop Time (ACUTE ONLY): 0919 SLP Time Calculation (min) (ACUTE ONLY): 19 min  Past Medical History:  Past Medical History:  Diagnosis Date  . ARTHRITIS   . Arthritis   . Diverticulosis   . HYPERTENSION   . HYPERTHYROIDISM   . Hyperthyroidism    s/p I-131 ablation 03/2011 of multinod goiter  . INSOMNIA   . MIGRAINE HEADACHE   . OBESITY   . Posttraumatic stress disorder   . Pulmonary embolism ([place]) 03/2014   with DVT  . Stroke [facility]) 03/2014   dysarthria   Past Surgical History:  Past Surgical History:  Procedure Laterality Date  . ABDOMINAL HYSTERECTOMY  1976  . BREAST SURGERY     biopsy  . RADIOLOGY WITH ANESTHESIA Left 03/08/2014   Procedure: RADIOLOGY WITH ANESTHESIA;  Surgeon: Rob Hickman, MD;  Location: [place];  Service: Radiology;  Laterality: Left;  . TONSILLECTOMY    . TOTAL HIP ARTHROPLASTY  1998    right   HPI:  Ptis a 83 y.o.femalewith medical history significant of hyperthyroidism, HTN, migraine headache, left MCA stroke with residual right sided weakness and aphasia, and PE who presented to the ED with c/o cough and wheezing. Patient seen by PCP on 04/13/2016 and diagnosed with CAP and UTI. CXR showed no acute findings, stable large substernal goiter causing tracheal narrowing and deviation to the right.   Assessment / Plan / Recommendation Clinical Impression  Sandy Barrett was alert but significantly aphasic (both expressive and receptive) due to previous CVA and appeared mildly confused. Pt's daughter reported swallowing difficulties (food becoming "lodged in throat") over the past several weeks. Per daughter and chart, pt had previous MBS in August of 2017 with findings of a moderate oral dysphagia (holding, anterior spillage) and mild pharyngeal dysphagia (weak and  delayed) with recommendations of Dys 3 solids and thin liquids. Observed  congested, wet cough prior to and during trials of POs and noted mild anterior spillage of thin liquids and wet vocal quality. Given respiratory status and prior hx, recommend instrumental swallow evaluation (MBS) to assess current swallow functioning and to determine least restrictive diet. Continue NPO; defer diet recommendations until completion of MBS (scheduled for today at 10:30am).  SLP Visit Diagnosis: Dysphagia, unspecified (R13.10)    Aspiration Risk  Moderate aspiration risk    Diet Recommendation NPO   Medication Administration: Other (Comment) (wait until completion of MBS)    Other  Recommendations Oral Care Recommendations: Oral care QID   Follow up Recommendations        Frequency and Duration            Prognosis Prognosis for Safe Diet Advancement: Good Barriers to Reach Goals: Language deficits      Swallow Study   General HPI: Ptis a 83 y.o.femalewith medical history significant of hyperthyroidism, HTN, migraine headache, left MCA stroke with residual right sided weakness and aphasia, and PE who presented to the ED with c/o cough and wheezing. Patient seen by PCP on 04/13/2016 and diagnosed with CAP and UTI. CXR showed no acute findings, stable large substernal goiter causing tracheal narrowing and deviation to the right. Type of Study: Bedside Swallow Evaluation Previous Swallow Assessment: 08/08/2015 MBS Diet Prior to this Study: NPO Temperature Spikes Noted: No Respiratory Status: Room air History of Recent Intubation: No Behavior/Cognition: Alert;Cooperative;Pleasant mood;Other (Comment);Requires cueing;Doesn't follow directions (aphasic)  Oral Cavity Assessment: Within Functional Limits Oral Care Completed by SLP: No Oral Cavity - Dentition: Adequate natural dentition (upper partial) Vision: Functional for self-feeding Self-Feeding Abilities: Needs set up;Needs assist Patient  Positioning: Upright in bed Baseline Vocal Quality: Wet Volitional Cough: Congested Volitional Swallow: Unable to elicit    Oral/Motor/Sensory Function Overall Oral Motor/Sensory Function: Other (comment) (unable to assess due to aphasia )   Ice Chips Ice chips: Not tested   Thin Liquid Thin Liquid: Impaired Presentation: Cup Oral Phase Impairments: Reduced labial seal (none) Oral Phase Functional Implications: Right anterior spillage;Left anterior spillage Pharyngeal  Phase Impairments: Wet Vocal Quality    Nectar Thick Nectar Thick Liquid: Not tested   Honey Thick Honey Thick Liquid: Not tested   Puree Puree: Within functional limits Presentation: Spoon   Solid   GO   Solid: Not tested        Fransisca Kaufmann , Student-SLP 04/21/2016,9:48 AM

## 2016-04-21 NOTE — Evaluation (Signed)
Physical Therapy Evaluation Patient Details Name: Sandy Barrett MRN: [id number] DOB: [birth year] Today's Date: 04/21/2016   History of Present Illness  83 y.o. female with medical history significant of Hyperthyroidism, HTN, migraine headache, left MCA stroke with residual right sided weakness and aphasia 2016,  PE admitted due to aspiration PNA.  Clinical Impression  Patient presents with decreased mobility per daughter due to weakness and leaning more to R in bed.  Limited eval to bed only due to frequent stools.  Patient may benefit from acute level PT to allow OOB and work on balance in sitting.  Feel likely not to need follow up PT at d/c.    Follow Up Recommendations Supervision/Assistance - 24 hour;No PT follow up    Equipment Recommendations  None recommended by PT    Recommendations for Other Services       Precautions / Restrictions Precautions Precautions: Fall      Mobility  Bed Mobility Overal bed mobility: Needs Assistance Bed Mobility: Rolling Rolling: Max assist;+2 for physical assistance         General bed mobility comments: patient soiled with feces in bed, rolled back and forth for hygiene with +2 A and continued stooling, daughter reports had medicine to increase stools.  Deferred EOB and OOB due to continued stooling,    Transfers                    Ambulation/Gait                Stairs            Wheelchair Mobility    Modified Rankin (Stroke Patients Only)       Balance                                             Pertinent Vitals/Pain Pain Assessment: Faces Faces Pain Scale: Hurts little more Pain Location: with movement of R arm Pain Descriptors / Indicators: Grimacing Pain Intervention(s): Monitored during session;Limited activity within patient's tolerance;Repositioned    Home Living Family/patient expects to be discharged to:: Private residence Living Arrangements: Children Available  Help at Discharge: Family;Available 24 hours/day Type of Home: House Home Access: Ramped entrance     Home Layout: One level Home Equipment: Wheelchair - power;Bedside commode;Hospital bed;Other (comment) (hoyer lift)      Prior Function Level of Independence: Needs assistance   Gait / Transfers Assistance Needed: assistance for bed to chair transfers   ADL's / Homemaking Assistance Needed: assist for bed bath and uses depends        Hand Dominance        Extremity/Trunk Assessment   Upper Extremity Assessment Upper Extremity Assessment: RUE deficits/detail RUE Deficits / Details: no attempt to actively move R arm, can squeeze a little with R hand    Lower Extremity Assessment Lower Extremity Assessment: RLE deficits/detail;LLE deficits/detail RLE Deficits / Details: held in hip flexion, abduction and ER with leaning to R in bed, PROM WFL except tight heel cords and hamstrings LLE Deficits / Details: active movement, but not able to test due to cognition    Cervical / Trunk Assessment Cervical / Trunk Assessment: Other exceptions Cervical / Trunk Exceptions: leaning R in bed  Communication   Communication: Expressive difficulties;Receptive difficulties  Cognition Arousal/Alertness: Awake/alert Behavior During Therapy: WFL for tasks assessed/performed Overall Cognitive Status: History of cognitive impairments -  at baseline                                        General Comments      Exercises     Assessment/Plan    PT Assessment Patient needs continued PT services  PT Problem List Decreased strength;Decreased balance;Decreased mobility       PT Treatment Interventions Functional mobility training;Therapeutic activities;Therapeutic exercise;Patient/family education;Balance training    PT Goals (Current goals can be found in the Care Plan section)  Acute Rehab PT Goals Patient Stated Goal: To return home PT Goal Formulation: With  patient/family Time For Goal Achievement: 04/28/16 Potential to Achieve Goals: Fair    Frequency Min 3X/week   Barriers to discharge        Co-evaluation               End of Session   Activity Tolerance: Other (comment) (limited due to stooling) Patient left: in bed;with family/visitor present   PT Visit Diagnosis: Unsteadiness on feet (R26.81)    Time: 1121-6244 PT Time Calculation (min) (ACUTE ONLY): 28 min   Charges:   PT Evaluation $PT Eval Moderate Complexity: 1 Procedure PT Treatments $Therapeutic Activity: 8-22 mins   PT G CodesMagda Kiel, [place] [phone] 04/21/2016   Reginia Naas 04/21/2016, 4:45 PM

## 2016-04-22 ENCOUNTER — Inpatient Hospital Stay (HOSPITAL_COMMUNITY): Payer: Medicare Other

## 2016-04-22 ENCOUNTER — Ambulatory Visit: Payer: Medicare Other | Admitting: Podiatry

## 2016-04-22 LAB — CBC
HCT: 43.6 % (ref 36.0–46.0)
Hemoglobin: 14.1 g/dL (ref 12.0–15.0)
MCH: 36.1 pg — ABNORMAL HIGH (ref 26.0–34.0)
MCHC: 32.3 g/dL (ref 30.0–36.0)
MCV: 111.5 fL — ABNORMAL HIGH (ref 78.0–100.0)
Platelets: 456 10*3/uL — ABNORMAL HIGH (ref 150–400)
RBC: 3.91 MIL/uL (ref 3.87–5.11)
RDW: 14.7 % (ref 11.5–15.5)
WBC: 6.5 10*3/uL (ref 4.0–10.5)

## 2016-04-22 MED ORDER — IPRATROPIUM-ALBUTEROL 0.5-2.5 (3) MG/3ML IN SOLN
3.0000 mL | Freq: Four times a day (QID) | RESPIRATORY_TRACT | Status: DC
  Administered 2016-04-22 – 2016-04-26 (×18): 3 mL via RESPIRATORY_TRACT
  Filled 2016-04-22 (×18): qty 3

## 2016-04-22 NOTE — Progress Notes (Signed)
[facility] PROGRESS NOTE  Sandy Barrett DGU:[id number] DOB: 1932-05-02 DOA: 04/20/2016   PCP: [name], MD     Assessment/Plan:  83 y.o. female with medical history significant of Hyperthyroidism, HTN, migraine headache, h/o Large left MCA stroke with R hemiplegia, dysphagia and aphasia 2016, bed bound at baseline, h/o  PE who presented to the ED with c/o cough that has been going on for more than a week . Treated by PCP for community-acquired pneumonia with Augmentin without improvement.Chest XRay showed large stable goiter causing tracheal narrowing and deviation to the right, haziness in the lower lung field and blunting of the left costophrenic angle  Assessment and plan  -Suspected Aspiration pneumonia -very weak cough, likely aspiration given patient's history of previous stroke with residual dysphasia, multinodular goiter  CT neck 07/01/15 showed severe stable thyromegaly,  -appreciate SLP input, MBS -functional oropharyngeal swallow mechanism and suspected esophageal dysphagia, D3 diet recommended -repeat CXR today -add nebs for pulm toilet -check Esophagram to check esoph anatomy, poor candidate for EGD with resp status to r/o esophageal dysphagia -Continue zosyn IV, add scheduled nebs  speech and swallow evaluation -dys 3  Multinodular goitre -Repeat CT soft tissue neck,  Shows only slight interval growth of large goiter, Dr.Abrol d/w with Dr Renato Shin, she is not a surgical candidate based on her CVA in 2016, may need RAI as outpatient, he recommended outpatient FU to discuss options -LAst TSH and freeT4 on 04/01/16 were normal on current dose of Methimazole -on CT chest  the SVC is displaced and effaced. And chronically occluded Left ICA/IJ  Hyperkalemia-will receive one dose of Kayexalate  Hypertension Currently controlled, continue to monitor and adjust doses if needed  Spastic hemiparesis (right sided) and aphasia after left MCA  stroke -bed/wheel chair bound at baseline -patient developed acute stroke, DVT and PE in 2016. -on eliquis, now on lovenox  Patient is on Eliquis for PE, - currently on Lovenox  bid, resume eliquis when ability to swallow is confirmed  Thrombocytosis  Followed by Dr Waldon Merl   DVT prophylaxsis Lovenox  Code Status:   Full code Family Communication: daughter at bedside Disposition Plan: home when improved     Consultants:  None  Procedures:  None  Antibiotics: Anti-infectives    Start     Dose/Rate Route Frequency Ordered Stop   04/20/16 2359  piperacillin-tazobactam (ZOSYN) IVPB 3.375 g     3.375 g 12.5 mL/hr over 240 Minutes Intravenous Every 8 hours 04/20/16 2341     04/20/16 1230  piperacillin-tazobactam (ZOSYN) IVPB 3.375 g     3.375 g 100 mL/hr over 30 Minutes Intravenous  Once 04/20/16 1227 04/20/16 1713         HPI/Subjective: Continues to cough, audible chest rattle noted  Objective: Vitals:   04/22/16 0029 04/22/16 0613 04/22/16 1407 04/22/16 1427  BP: 121/88 (!) 144/82 128/76   Pulse: 94 87 86   Resp: 18 18 18    Temp: 98.5 F (36.9 C) 98.2 F (36.8 C)    TempSrc:  Oral    SpO2: 100% 94%  97%  Weight:  72 kg (158 lb 11.7 oz)      Intake/Output Summary (Last 24 hours) at 04/22/16 1442 Last data filed at 04/22/16 1428  Gross per 24 hour  Intake          1243.75 ml  Output                0 ml  Net          1243.75 ml    Exam:  Examination:  General: chronically ill female, somnolent, uncomfortable, aphasic Lungs:: diffuse ronchi, conducted upper airway sounds Cardiovascular system: S1 & S2 heard, RRR. No JVD, murmurs, rubs Abd: : Abdomen is nondistended, soft and nontender. Normal bowel sounds heard. Central nervous system: awake, aphasic, R hemiplegia. Extremities: R hemiplegia Skin: No rashes, lesions or ulcers Psychiatry: unable to assess    Data Reviewed: I have personally reviewed following labs and imaging  studies  Micro Results Recent Results (from the past 240 hour(s))  Culture, blood (routine x 2) Call MD if unable to obtain prior to antibiotics being given     Status: None (Preliminary result)   Collection Time: 04/20/16  7:35 PM  Result Value Ref Range Status   Specimen Description BLOOD LEFT ARM  Final   Special Requests IN PEDIATRIC BOTTLE BCAV  Final   Culture NO GROWTH < 24 HOURS  Final   Report Status PENDING  Incomplete  Culture, blood (routine x 2) Call MD if unable to obtain prior to antibiotics being given     Status: None (Preliminary result)   Collection Time: 04/20/16  7:37 PM  Result Value Ref Range Status   Specimen Description BLOOD RIGHT HAND  Final   Special Requests IN PEDIATRIC BOTTLE Blood Culture adequate volume  Final   Culture  Setup Time   Final    GRAM POSITIVE COCCI IN CLUSTERS IN PEDIATRIC BOTTLE CRITICAL RESULT CALLED TO, READ BACK BY AND VERIFIED WITH: CARON AMEND,PHARMD @0025  04/22/16 MKELLY,MLT    Culture GRAM POSITIVE COCCI  Final   Report Status PENDING  Incomplete    Radiology Reports Dg Chest 1 View  Result Date: 04/14/2016 CLINICAL DATA:  Chest congestion, cough, dysphagia for the past 5 days. History previous CVA, pulmonary embolism. Patient unable to raise the chin above the chest. EXAM: CHEST 1 VIEW COMPARISON:  Portable chest x-ray of May 31, 2015 and chest CT scan of May 31, 2015. FINDINGS: Again demonstrated is a large soft tissue mass which deviates the trachea to the right most compatible with a goiter which is been demonstrated on previous chest x-ray and CT scan. The lungs are well-expanded. There are stable calcifications in the left upper hemithorax which lie in the soft tissues superficial to the scapula on the previous CT scan. The cardiac silhouette remains enlarged. The pulmonary vascularity is not engorged. There is calcification in the wall of the aortic arch. There is degenerative change of both shoulders. IMPRESSION: No acute  pneumonia nor pulmonary edema.  Stable mild cardiomegaly. Thoracic aortic atherosclerosis. Large goiter with deviation of the trachea toward the right with narrowing of the tracheal lumen. This has progressed somewhat since the previous study. Electronically Signed   By: David  Martinique M.D.   On: 04/14/2016 08:28   Dg Chest 2 View  Result Date: 04/20/2016 CLINICAL DATA:  Cough and shortness of breath. EXAM: CHEST  2 VIEW COMPARISON:  04/13/2016 and chest CT on 05/31/2015 FINDINGS: Heart size is stable. Large mediastinal mass resulting in tracheal narrowing and deviation to the right is stable, and consistent with large substernal goiter, as demonstrated on previous CT. No evidence of pulmonary infiltrate or edema. Limited visualization on lateral projection due to position of patient's arms. IMPRESSION: No acute findings. Stable large substernal goiter causing tracheal narrowing and deviation to the right. Electronically Signed   By: Earle Gell M.D.   On: 04/20/2016 10:13  Ct Soft Tissue Neck W Contrast  Result Date: 04/21/2016 CLINICAL DATA:  Continued surveillance multinodular goiter. EXAM: CT NECK WITH CONTRAST TECHNIQUE: Multidetector CT imaging of the neck was performed using the standard protocol following the bolus administration of intravenous contrast. CONTRAST:  63mL ISOVUE-300 IOPAMIDOL (ISOVUE-300) INJECTION 61% COMPARISON:  07/01/2015. FINDINGS: Pharynx and larynx: Normal pharyngeal structures. Severe rightward displacement of the proximal trachea and larynx due to the leftward dominant multinodular goiter. This may be increased from prior of June 2017. Salivary glands: No inflammation, mass, or stone. Thyroid: Markedly enlarged and heterogeneous thyroid, with multiple nodules and scattered calcifications, significant substernal extent. In addition to mass effect on the trachea, the SVC is displaced and effaced. Approximate measurements are 97 x 45 x 68 mm, although the entire craniocaudal  extent is not assessed on this CT neck study. Lymph nodes: None enlarged or abnormal density. Vascular: Occluded LEFT ICA. Calcifications in the LEFT internal jugular are redemonstrated consistent with chronic thrombosis. Limited intracranial: LEFT temporal lobe encephalomalacia better visualized on previous exam. Visualized orbits: Not visualized. Mastoids and visualized paranasal sinuses: Clear. Skeleton: No acute osseous findings. Cervical spondylosis. Nuchal ligament ossification. Upper chest: No pulmonary nodule or pneumothorax. Other: None. IMPRESSION: Slight interval growth since 2017 of large goiter. Significant LEFT-to-RIGHT mass effect on the trachea and SVC; see discussion above. Electronically Signed   By: Staci Righter M.D.   On: 04/21/2016 13:19   Dg Swallowing Func-speech Pathology  Result Date: 04/21/2016 Objective Swallowing Evaluation: Type of Study: MBS-Modified Barium Swallow Study Patient Details Name: Sandy Barrett MRN: [id number] Date of Birth: Sep 04, 1932 Today's Date: 04/21/2016 Time: SLP Start Time (ACUTE ONLY): 1039-SLP Stop Time (ACUTE ONLY): 1053 SLP Time Calculation (min) (ACUTE ONLY): 14 min Past Medical History: Past Medical History: Diagnosis Date . ARTHRITIS  . Arthritis  . Diverticulosis  . HYPERTENSION  . HYPERTHYROIDISM  . Hyperthyroidism   s/p I-131 ablation 03/2011 of multinod goiter . INSOMNIA  . MIGRAINE HEADACHE  . OBESITY  . Posttraumatic stress disorder  . Pulmonary embolism ([place]) 03/2014  with DVT . Stroke [facility]) 03/2014  dysarthria Past Surgical History: Past Surgical History: Procedure Laterality Date . ABDOMINAL HYSTERECTOMY  1976 . BREAST SURGERY    biopsy . RADIOLOGY WITH ANESTHESIA Left 03/08/2014  Procedure: RADIOLOGY WITH ANESTHESIA;  Surgeon: Rob Hickman, MD;  Location: [place];  Service: Radiology;  Laterality: Left; . TONSILLECTOMY   . TOTAL HIP ARTHROPLASTY  1998   right HPI: Ptis a 83 y.o.femalewith medical history significant of hyperthyroidism, HTN,  migraine headache, left MCA stroke with residual right sided weakness and aphasia, and PE who presented to the ED with c/o cough and wheezing. Patient seen by PCP on 04/13/2016 and diagnosed with CAP and UTI. CXR showed no acute findings, stable large substernal goiter causing tracheal narrowing and deviation to the right. No Data Recorded Assessment / Plan / Recommendation CHL IP CLINICAL IMPRESSIONS 04/21/2016 Clinical Impression Ms. Staggs presents with a mild oral/pharyngeal dysphagia, suspect primary esophageal dysphagia. Oral phase impairments included mild prolonged mastication with regular solids (pt wears partial dentures). Observed mild vallecular and pyriform sinus residue with thin liquids via cup/straw due to reduced laryngeal elevation and epiglottic inversion. MBS does not diagnose below the level of the UES however scan of esophagus revealed stasis within the mid-esophagus and questionable stenosis. Pt previously diagnosed with a substernal goiter causing tracheal narrowing and deviation to the right possible causing observations. Educated pt re: results of MBS, diet recommendations, esophageal precautions (alternate solids/liquids, sit upright  after meals), and advised pt to consider GI consult due to esophageal findings. Recommend Dys 3 solids, thin liquids (straws ok), meds crushed. ST will f/u briefly to assess diet tolerance and for continued education. SLP Visit Diagnosis Dysphagia, pharyngoesophageal phase (R13.14) Attention and concentration deficit following -- Frontal lobe and executive function deficit following -- Impact on safety and function Mild aspiration risk   CHL IP TREATMENT RECOMMENDATION 04/21/2016 Treatment Recommendations Therapy as outlined in treatment plan below   Prognosis 04/21/2016 Prognosis for Safe Diet Advancement Good Barriers to Reach Goals -- Barriers/Prognosis Comment -- CHL IP DIET RECOMMENDATION 04/21/2016 SLP Diet Recommendations Dysphagia 3 (Mech soft) solids;Thin  liquid Liquid Administration via Cup;Straw Medication Administration Crushed with puree Compensations Slow rate;Small sips/bites;Follow solids with liquid Postural Changes Seated upright at 90 degrees   CHL IP OTHER RECOMMENDATIONS 04/21/2016 Recommended Consults Consider GI evaluation;Consider esophageal assessment Oral Care Recommendations Oral care BID Other Recommendations --   CHL IP FOLLOW UP RECOMMENDATIONS 04/21/2016 Follow up Recommendations Skilled Nursing facility;24 hour supervision/assistance   CHL IP FREQUENCY AND DURATION 04/21/2016 Speech Therapy Frequency (ACUTE ONLY) min 1 x/week Treatment Duration 1 week      CHL IP ORAL PHASE 04/21/2016 Oral Phase Impaired Oral - Pudding Teaspoon -- Oral - Pudding Cup -- Oral - Honey Teaspoon -- Oral - Honey Cup -- Oral - Nectar Teaspoon -- Oral - Nectar Cup -- Oral - Nectar Straw -- Oral - Thin Teaspoon -- Oral - Thin Cup WFL Oral - Thin Straw WFL Oral - Puree -- Oral - Mech Soft -- Oral - Regular (No Data) Oral - Multi-Consistency -- Oral - Pill -- Oral Phase - Comment --  CHL IP PHARYNGEAL PHASE 04/21/2016 Pharyngeal Phase Impaired Pharyngeal- Pudding Teaspoon -- Pharyngeal -- Pharyngeal- Pudding Cup -- Pharyngeal -- Pharyngeal- Honey Teaspoon -- Pharyngeal -- Pharyngeal- Honey Cup -- Pharyngeal -- Pharyngeal- Nectar Teaspoon -- Pharyngeal -- Pharyngeal- Nectar Cup -- Pharyngeal -- Pharyngeal- Nectar Straw -- Pharyngeal -- Pharyngeal- Thin Teaspoon -- Pharyngeal -- Pharyngeal- Thin Cup Pharyngeal residue - valleculae;Pharyngeal residue - pyriform;Reduced laryngeal elevation;Reduced epiglottic inversion Pharyngeal -- Pharyngeal- Thin Straw Pharyngeal residue - valleculae;Pharyngeal residue - pyriform;Reduced laryngeal elevation;Reduced epiglottic inversion Pharyngeal -- Pharyngeal- Puree -- Pharyngeal -- Pharyngeal- Mechanical Soft -- Pharyngeal -- Pharyngeal- Regular Pharyngeal residue - valleculae;Reduced epiglottic inversion;Reduced laryngeal elevation Pharyngeal  -- Pharyngeal- Multi-consistency -- Pharyngeal -- Pharyngeal- Pill -- Pharyngeal -- Pharyngeal Comment --  CHL IP CERVICAL ESOPHAGEAL PHASE 04/21/2016 Cervical Esophageal Phase WFL Pudding Teaspoon -- Pudding Cup -- Honey Teaspoon -- Honey Cup -- Nectar Teaspoon -- Nectar Cup -- Nectar Straw -- Thin Teaspoon -- Thin Cup -- Thin Straw -- Puree -- Mechanical Soft -- Regular -- Multi-consistency -- Pill -- Cervical Esophageal Comment -- CHL IP GO 08/08/2015 Functional Assessment Tool Used mbs Functional Limitations Swallowing Swallow Current Status (U7253) CJ Swallow Goal Status (G6440) CJ Swallow Discharge Status (H4742) CJ Motor Speech Current Status (V9563) (None) Motor Speech Goal Status (O7564) (None) Motor Speech Goal Status (P3295) (None) Spoken Language Comprehension Current Status (J8841) (None) Spoken Language Comprehension Goal Status (Y6063) (None) Spoken Language Comprehension Discharge Status (K1601) (None) Spoken Language Expression Current Status (U9323) (None) Spoken Language Expression Goal Status (F5732) (None) Spoken Language Expression Discharge Status (K0254) (None) Attention Current Status (Y7062) (None) Attention Goal Status (B7628) (None) Attention Discharge Status (B1517) (None) Memory Current Status (O1607) (None) Memory Goal Status (P7106) (None) Memory Discharge Status (Y6948) (None) Voice Current Status (N4627) (None) Voice Goal Status (O3500) (None) Voice Discharge Status (X3818) (None) Other  Speech-Language Pathology Functional Limitation Current Status [phone]) (None) Other Speech-Language Pathology Functional Limitation Goal Status (W8032) (None) Other Speech-Language Pathology Functional Limitation Discharge Status [phone]) (None) [place] Siren 04/21/2016, 2:50 PM Orbie Pyo Colvin Caroli.Ed CCC-SLP Pager [phone]                CBC  Recent Labs Lab 04/20/16 0945 04/20/16 0949 04/21/16 0614 04/22/16 0607  WBC 5.8  --  5.6 6.5  HGB 13.5 15.0 13.9 14.1  HCT 42.0 44.0 43.1  43.6  PLT 455*  --  435* 456*  MCV 111.4*  --  111.4* 111.5*  MCH 35.8*  --  35.9* 36.1*  MCHC 32.1  --  32.3 32.3  RDW 14.3  --  14.8 14.7  LYMPHSABS 1.5  --   --   --   MONOABS 0.3  --   --   --   EOSABS 0.2  --   --   --   BASOSABS 0.1  --   --   --     Chemistries   Recent Labs Lab 04/20/16 0945 04/20/16 0949 04/21/16 0614 04/21/16 1453  NA 140 140 139 142  K 4.6 4.3 6.1* 4.2  CL 107 104 107 106  CO2 27  --  22 24  GLUCOSE 84 87 74 110*  BUN 9 10 8  5*  CREATININE 0.42* 0.80 0.43* 0.42*  CALCIUM 8.9  --  8.9 8.9   ------------------------------------------------------------------------------------------------------------------ estimated creatinine clearance is 48.8 mL/min (A) (by C-G formula based on SCr of 0.42 mg/dL (L)). ------------------------------------------------------------------------------------------------------------------ No results for input(s): HGBA1C in the last 72 hours. ------------------------------------------------------------------------------------------------------------------ No results for input(s): CHOL, HDL, LDLCALC, TRIG, CHOLHDL, LDLDIRECT in the last 72 hours. ------------------------------------------------------------------------------------------------------------------ No results for input(s): TSH, T4TOTAL, T3FREE, THYROIDAB in the last 72 hours.  Invalid input(s): FREET3 ------------------------------------------------------------------------------------------------------------------ No results for input(s): VITAMINB12, FOLATE, FERRITIN, TIBC, IRON, RETICCTPCT in the last 72 hours.  Coagulation profile No results for input(s): INR, PROTIME in the last 168 hours.  No results for input(s): DDIMER in the last 72 hours.  Cardiac Enzymes No results for input(s): CKMB, TROPONINI, MYOGLOBIN in the last 168 hours.  Invalid input(s):  CK ------------------------------------------------------------------------------------------------------------------ Invalid input(s): POCBNP   CBG: No results for input(s): GLUCAP in the last 168 hours.     Studies: Ct Soft Tissue Neck W Contrast  Result Date: 04/21/2016 CLINICAL DATA:  Continued surveillance multinodular goiter. EXAM: CT NECK WITH CONTRAST TECHNIQUE: Multidetector CT imaging of the neck was performed using the standard protocol following the bolus administration of intravenous contrast. CONTRAST:  47mL ISOVUE-300 IOPAMIDOL (ISOVUE-300) INJECTION 61% COMPARISON:  07/01/2015. FINDINGS: Pharynx and larynx: Normal pharyngeal structures. Severe rightward displacement of the proximal trachea and larynx due to the leftward dominant multinodular goiter. This may be increased from prior of June 2017. Salivary glands: No inflammation, mass, or stone. Thyroid: Markedly enlarged and heterogeneous thyroid, with multiple nodules and scattered calcifications, significant substernal extent. In addition to mass effect on the trachea, the SVC is displaced and effaced. Approximate measurements are 97 x 45 x 68 mm, although the entire craniocaudal extent is not assessed on this CT neck study. Lymph nodes: None enlarged or abnormal density. Vascular: Occluded LEFT ICA. Calcifications in the LEFT internal jugular are redemonstrated consistent with chronic thrombosis. Limited intracranial: LEFT temporal lobe encephalomalacia better visualized on previous exam. Visualized orbits: Not visualized. Mastoids and visualized paranasal sinuses: Clear. Skeleton: No acute osseous findings. Cervical spondylosis. Nuchal ligament ossification. Upper chest: No pulmonary nodule or pneumothorax. Other: None. IMPRESSION:  Slight interval growth since 2017 of large goiter. Significant LEFT-to-RIGHT mass effect on the trachea and SVC; see discussion above. Electronically Signed   By: Staci Righter M.D.   On: 04/21/2016 13:19    Dg Swallowing Func-speech Pathology  Result Date: 04/21/2016 Objective Swallowing Evaluation: Type of Study: MBS-Modified Barium Swallow Study Patient Details Name: Sandy Barrett MRN: [id number] Date of Birth: [birth year] Today's Date: 04/21/2016 Time: SLP Start Time (ACUTE ONLY): 1039-SLP Stop Time (ACUTE ONLY): 1053 SLP Time Calculation (min) (ACUTE ONLY): 14 min Past Medical History: Past Medical History: Diagnosis Date . ARTHRITIS  . Arthritis  . Diverticulosis  . HYPERTENSION  . HYPERTHYROIDISM  . Hyperthyroidism   s/p I-131 ablation 03/2011 of multinod goiter . INSOMNIA  . MIGRAINE HEADACHE  . OBESITY  . Posttraumatic stress disorder  . Pulmonary embolism ([place]) 03/2014  with DVT . Stroke [facility]) 03/2014  dysarthria Past Surgical History: Past Surgical History: Procedure Laterality Date . ABDOMINAL HYSTERECTOMY  1976 . BREAST SURGERY    biopsy . RADIOLOGY WITH ANESTHESIA Left 03/08/2014  Procedure: RADIOLOGY WITH ANESTHESIA;  Surgeon: Rob Hickman, MD;  Location: [place];  Service: Radiology;  Laterality: Left; . TONSILLECTOMY   . TOTAL HIP ARTHROPLASTY  1998   right HPI: Ptis a 83 y.o.femalewith medical history significant of hyperthyroidism, HTN, migraine headache, left MCA stroke with residual right sided weakness and aphasia, and PE who presented to the ED with c/o cough and wheezing. Patient seen by PCP on 04/13/2016 and diagnosed with CAP and UTI. CXR showed no acute findings, stable large substernal goiter causing tracheal narrowing and deviation to the right. No Data Recorded Assessment / Plan / Recommendation CHL IP CLINICAL IMPRESSIONS 04/21/2016 Clinical Impression Ms. Yera presents with a mild oral/pharyngeal dysphagia, suspect primary esophageal dysphagia. Oral phase impairments included mild prolonged mastication with regular solids (pt wears partial dentures). Observed mild vallecular and pyriform sinus residue with thin liquids via cup/straw due to reduced laryngeal elevation and  epiglottic inversion. MBS does not diagnose below the level of the UES however scan of esophagus revealed stasis within the mid-esophagus and questionable stenosis. Pt previously diagnosed with a substernal goiter causing tracheal narrowing and deviation to the right possible causing observations. Educated pt re: results of MBS, diet recommendations, esophageal precautions (alternate solids/liquids, sit upright after meals), and advised pt to consider GI consult due to esophageal findings. Recommend Dys 3 solids, thin liquids (straws ok), meds crushed. ST will f/u briefly to assess diet tolerance and for continued education. SLP Visit Diagnosis Dysphagia, pharyngoesophageal phase (R13.14) Attention and concentration deficit following -- Frontal lobe and executive function deficit following -- Impact on safety and function Mild aspiration risk   CHL IP TREATMENT RECOMMENDATION 04/21/2016 Treatment Recommendations Therapy as outlined in treatment plan below   Prognosis 04/21/2016 Prognosis for Safe Diet Advancement Good Barriers to Reach Goals -- Barriers/Prognosis Comment -- CHL IP DIET RECOMMENDATION 04/21/2016 SLP Diet Recommendations Dysphagia 3 (Mech soft) solids;Thin liquid Liquid Administration via Cup;Straw Medication Administration Crushed with puree Compensations Slow rate;Small sips/bites;Follow solids with liquid Postural Changes Seated upright at 90 degrees   CHL IP OTHER RECOMMENDATIONS 04/21/2016 Recommended Consults Consider GI evaluation;Consider esophageal assessment Oral Care Recommendations Oral care BID Other Recommendations --   CHL IP FOLLOW UP RECOMMENDATIONS 04/21/2016 Follow up Recommendations Skilled Nursing facility;24 hour supervision/assistance   CHL IP FREQUENCY AND DURATION 04/21/2016 Speech Therapy Frequency (ACUTE ONLY) min 1 x/week Treatment Duration 1 week      CHL IP ORAL PHASE 04/21/2016 Oral Phase Impaired  Oral - Pudding Teaspoon -- Oral - Pudding Cup -- Oral - Honey Teaspoon -- Oral -  Honey Cup -- Oral - Nectar Teaspoon -- Oral - Nectar Cup -- Oral - Nectar Straw -- Oral - Thin Teaspoon -- Oral - Thin Cup WFL Oral - Thin Straw WFL Oral - Puree -- Oral - Mech Soft -- Oral - Regular (No Data) Oral - Multi-Consistency -- Oral - Pill -- Oral Phase - Comment --  CHL IP PHARYNGEAL PHASE 04/21/2016 Pharyngeal Phase Impaired Pharyngeal- Pudding Teaspoon -- Pharyngeal -- Pharyngeal- Pudding Cup -- Pharyngeal -- Pharyngeal- Honey Teaspoon -- Pharyngeal -- Pharyngeal- Honey Cup -- Pharyngeal -- Pharyngeal- Nectar Teaspoon -- Pharyngeal -- Pharyngeal- Nectar Cup -- Pharyngeal -- Pharyngeal- Nectar Straw -- Pharyngeal -- Pharyngeal- Thin Teaspoon -- Pharyngeal -- Pharyngeal- Thin Cup Pharyngeal residue - valleculae;Pharyngeal residue - pyriform;Reduced laryngeal elevation;Reduced epiglottic inversion Pharyngeal -- Pharyngeal- Thin Straw Pharyngeal residue - valleculae;Pharyngeal residue - pyriform;Reduced laryngeal elevation;Reduced epiglottic inversion Pharyngeal -- Pharyngeal- Puree -- Pharyngeal -- Pharyngeal- Mechanical Soft -- Pharyngeal -- Pharyngeal- Regular Pharyngeal residue - valleculae;Reduced epiglottic inversion;Reduced laryngeal elevation Pharyngeal -- Pharyngeal- Multi-consistency -- Pharyngeal -- Pharyngeal- Pill -- Pharyngeal -- Pharyngeal Comment --  CHL IP CERVICAL ESOPHAGEAL PHASE 04/21/2016 Cervical Esophageal Phase WFL Pudding Teaspoon -- Pudding Cup -- Honey Teaspoon -- Honey Cup -- Nectar Teaspoon -- Nectar Cup -- Nectar Straw -- Thin Teaspoon -- Thin Cup -- Thin Straw -- Puree -- Mechanical Soft -- Regular -- Multi-consistency -- Pill -- Cervical Esophageal Comment -- CHL IP GO 08/08/2015 Functional Assessment Tool Used mbs Functional Limitations Swallowing Swallow Current Status (J8250) CJ Swallow Goal Status (N3976) CJ Swallow Discharge Status (B3419) CJ Motor Speech Current Status (F7902) (None) Motor Speech Goal Status (I0973) (None) Motor Speech Goal Status (Z3299) (None) Spoken  Language Comprehension Current Status (M4268) (None) Spoken Language Comprehension Goal Status (T4196) (None) Spoken Language Comprehension Discharge Status (Q2297) (None) Spoken Language Expression Current Status (L8921) (None) Spoken Language Expression Goal Status (J9417) (None) Spoken Language Expression Discharge Status (E0814) (None) Attention Current Status (G8185) (None) Attention Goal Status (U3149) (None) Attention Discharge Status (F0263) (None) Memory Current Status (Z8588) (None) Memory Goal Status (F0277) (None) Memory Discharge Status (A1287) (None) Voice Current Status (O6767) (None) Voice Goal Status (M0947) (None) Voice Discharge Status (S9628) (None) Other Speech-Language Pathology Functional Limitation Current Status (Z6629) (None) Other Speech-Language Pathology Functional Limitation Goal Status (U7654) (None) Other Speech-Language Pathology Functional Limitation Discharge Status [id number]) (None) [place] Siren 04/21/2016, 2:50 PM Orbie Pyo Litaker M.Ed CCC-SLP Pager [phone]                 Lab Results  Component Value Date   HGBA1C 5.4 03/09/2014   Lab Results  Component Value Date   LDLCALC 53 03/09/2014   CREATININE 0.42 (L) 04/21/2016       Scheduled Meds: . chlorhexidine  15 mL Mouth Rinse BID  . diclofenac sodium  2 g Topical QID  . enoxaparin (LOVENOX) injection  80 mg Subcutaneous Q12H  . ipratropium-albuterol  3 mL Nebulization Q6H  . mouth rinse  15 mL Mouth Rinse q12n4p  . metoprolol  2.5 mg Intravenous Q6H   Continuous Infusions: . dextrose 5 % and 0.45% NaCl 50 mL/hr at 04/22/16 1202  . piperacillin-tazobactam (ZOSYN)  IV       LOS: 2 days    Time spent: >30 MINS    Kelcey Korus  [facility] Page via [email or web address], password [email or web address]  If 7PM-7AM, please contact night-coverage at [email or web address], password [facility] 04/22/2016,  2:42 PM  LOS: 2 days

## 2016-04-22 NOTE — Progress Notes (Signed)
[facility]  Patient Status: Active (receiving services up to time of hospitalization)  [facility] is providing the following services: PT  If patient discharges after hours, please call [phone].   Janae Sauce 04/22/2016, 9:50 AM

## 2016-04-22 NOTE — Progress Notes (Signed)
PHARMACY - PHYSICIAN COMMUNICATION CRITICAL VALUE ALERT - BLOOD CULTURE IDENTIFICATION (BCID)  Lab called with 1/2 blood cx showing GPC clusters. No BCID organism.  Name of physician (or Provider) Contacted: Lamar Blinks, NP  Changes to prescribed antibiotics required: None  Sherlon Handing, PharmD, BCPS Clinical pharmacist, pager [phone] 04/22/2016  12:34 AM

## 2016-04-22 NOTE — Consult Note (Signed)
[place] Nurse wound consult note Reason for Consult:pressure ulcer on sacrum Wound type:stage II Pressure Injury POA: Yes Measurement: 3cm x 1cm x 0.1cm  Wound HWT:UUEK Drainage (amount, consistency, odor) none Periwound:intact Dressing procedure/placement/frequency: I have provided nurses with orders for To sacral stage II ulcer, cleanse with soap and water, pat dry, cover with foam dressing, change Q5d and prn soiling. Educated pt and family on importance of turning and also of floating heels, verbalize understanding. We will not follow, but will remain available to this patient, to nursing, and the medical and/or surgical teams.  Please re-consult if we need to assist further.    Fara Olden, RN-C, WTA-C Wound Treatment Associate

## 2016-04-22 NOTE — Progress Notes (Signed)
Physical Therapy Treatment Patient Details Name: Sandy Barrett MRN: [id number] DOB: 1932-08-11 Today's Date: 04/22/2016    History of Present Illness 83 y.o. female with medical history significant of Hyperthyroidism, HTN, migraine headache, left MCA stroke with residual right sided weakness and aphasia 2016,  PE admitted due to aspiration PNA.    PT Comments    Pt performed bed level exercises after refusing to sit edge of bed.  RN reports to keep patient in bed for chest xray will try OOB transfer next session.   Educated daughter on stretching heel cords.    Follow Up Recommendations  Supervision/Assistance - 24 hour;No PT follow up     Equipment Recommendations  None recommended by PT    Recommendations for Other Services       Precautions / Restrictions Precautions Precautions: Fall Precaution Comments: flaccid RUE Restrictions Weight Bearing Restrictions: No    Mobility  Bed Mobility Overal bed mobility: Needs Assistance             General bed mobility comments: Pt assisted to [facility] (boosting) after therapeutic exercise.  requires total assist.    Transfers                 General transfer comment: RN reports she sat in chair earlier and she needs to remain in bed for her chest xray this pm.  deferred tx to supine exercises.    Ambulation/Gait                 Stairs            Wheelchair Mobility    Modified Rankin (Stroke Patients Only)       Balance Overall balance assessment:  (did not assess due to bed level tx in supine)                                          Cognition Arousal/Alertness: Awake/alert Behavior During Therapy: WFL for tasks assessed/performed Overall Cognitive Status: History of cognitive impairments - at baseline                                        Exercises General Exercises - Lower Extremity Ankle Circles/Pumps: Both;10 reps;AAROM;Supine;PROM Heel Slides:  AAROM;Both;10 reps;Supine;PROM Hip ABduction/ADduction: AAROM;PROM;Both;10 reps;Supine Straight Leg Raises: PROM;AAROM;Both;10 reps;Supine    General Comments        Pertinent Vitals/Pain Pain Assessment: Faces Faces Pain Scale: Hurts a little bit Pain Location: generalized with LE exercises.   Pain Descriptors / Indicators: Grimacing Pain Intervention(s): Monitored during session;Repositioned    Home Living Family/patient expects to be discharged to:: Private residence Living Arrangements: Children Available Help at Discharge: Family;Available 24 hours/day Type of Home: House Home Access: Ramped entrance   Home Layout: One level Home Equipment: Wheelchair - power;Bedside commode;Hospital bed;Other (comment) (hoyer lift)      Prior Function Level of Independence: Needs assistance  Gait / Transfers Assistance Needed: assistance for bed to chair transfers  ADL's / Homemaking Assistance Needed: assist for bed bath and uses depends     PT Goals (current goals can now be found in the care plan section) Acute Rehab PT Goals Patient Stated Goal: To return home Potential to Achieve Goals: Fair Progress towards PT goals: Progressing toward goals    Frequency    Min 3X/week  PT Plan Current plan remains appropriate    Co-evaluation             End of Session Equipment Utilized During Treatment: Gait belt Activity Tolerance: Other (comment) (limited due to fatigue from previous transfer OOB and need to stay in bed for chest xray.  ) Patient left: in bed;with family/visitor present   PT Visit Diagnosis: Unsteadiness on feet (R26.81)     Time: 2924-4628 PT Time Calculation (min) (ACUTE ONLY): 22 min  Charges:  $Therapeutic Exercise: 8-22 mins                    G Codes:       Governor Rooks, PTA pager [place] 04/22/2016, 5:29 PM Governor Rooks, PTA pager [phone]

## 2016-04-22 NOTE — Evaluation (Signed)
Occupational Therapy Evaluation Patient Details Name: Sandy Barrett MRN: [id number] DOB: Jun 02, 1932 Today's Date: 04/22/2016    History of Present Illness 83 y.o. 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   Clinical Impression   Pt admitted with aspiration PNA. Pt currently with functional limitations due to the deficits listed below (see OT Problem List). Pt will benefit from skilled OT to increase their safety and independence with ADL and functional mobility for ADL to facilitate discharge to venue listed below.      Follow Up Recommendations  No OT follow up    Equipment Recommendations  None recommended by OT       Precautions / Restrictions Precautions Precaution Comments: flaccid RUE      Mobility Bed Mobility      did not perform. Pt exhausted from getting back to bed            Transfers                 General transfer comment: chair to bed with CNA upon OT entering room with lift        ADL either performed or assessed with clinical judgement   ADL Overall ADL's : Needs assistance/impaired                                       General ADL Comments: pt max- total A with ADL activity. Pt can feed self and wash face with L hand.                    Pertinent Vitals/Pain Faces Pain Scale: Hurts even more Pain Location: with RUE repositioning/ elevation Pain Descriptors / Indicators: Grimacing Pain Intervention(s): Monitored during session;Limited activity within patient's tolerance;Repositioned     Hand Dominance     Extremity/Trunk Assessment Upper Extremity Assessment Upper Extremity Assessment: RUE deficits/detail RUE Deficits / Details: PROM for  shoulder flexion stopped at 90 degrees due to grimacing RUE: Unable to fully assess due to pain RUE Coordination: decreased  fine motor;decreased gross motor           Communication Communication Communication: Expressive difficulties;Receptive difficulties   Cognition Arousal/Alertness: Awake/alert Behavior During Therapy: WFL for tasks assessed/performed Overall Cognitive Status: History of cognitive impairments - at baseline                                     General Comments   Pts RUE with significant edema. Educated pt and daughter to prop on pillow as well as perform gentle PROM.  Pt not able to tolerate PROM of FF past 90 degrees as pt grimaced.  Propped RUE on 2 pillows and performed gentle retrograde massage.              Home Living Family/patient expects to be discharged to:: Private residence Living Arrangements: Children Available Help at Discharge: Family;Available 24 hours/day Type of Home: House Home Access: Ramped entrance     Home Layout: One level     Bathroom Shower/Tub: Tub/shower unit         Home Equipment: Wheelchair - power;Bedside commode;Hospital bed;Other (comment) (hoyer lift)          Prior Functioning/Environment Level of Independence: Needs assistance  Gait / Transfers Assistance Needed: assistance for bed to chair transfers  ADL's / Homemaking Assistance Needed: assist for bed bath and uses depends Communication / Swallowing Assistance Needed: expressive and receptive aphasia          OT Problem List: Decreased strength;Decreased range of motion;Decreased activity tolerance;Impaired UE functional use;Pain      OT Treatment/Interventions: Self-care/ADL training;Patient/family education;Therapeutic exercise    OT Goals(Current goals can be found in the care plan section) Acute Rehab OT Goals Patient Stated Goal: To return home OT Goal Formulation: With patient Time For Goal Achievement: 04/29/16 Potential to Achieve Goals: Good  OT Frequency: Min 2X/week              End of Session Nurse Communication: Mobility  status  Activity Tolerance: Patient limited by fatigue Patient left: in bed;with call bell/phone within reach;with family/visitor present  OT Visit Diagnosis: Pain;Hemiplegia and hemiparesis Hemiplegia - Right/Left: Right Hemiplegia - dominant/non-dominant: Dominant Hemiplegia - caused by: Cerebral infarction                Time: 1530-1541 OT Time Calculation (min): 11 min Charges:  OT General Charges $OT Visit: 1 Procedure OT Evaluation $OT Eval Moderate Complexity: 1 Procedure G-Codes:     Kari Baars, [place]  Payton Mccallum D 04/22/2016, 3:54 PM

## 2016-04-22 NOTE — Progress Notes (Signed)
  Speech Language Pathology Treatment: Dysphagia  Patient Details Name: Kinzey Sheriff MRN: [id number] DOB: 12/16/1932 Today's Date: 04/22/2016 Time: 9381-8299 SLP Time Calculation (min) (ACUTE ONLY): 12 min  Assessment / Plan / Recommendation Clinical Impression  Ms. Bille continues to exhibit audible chest congestion/rattling, and is unable to achieve a volitional cough to clear chest congestion and reflexive cough was not present. MBS findings from yesterday showed relatively functional oropharyngeal swallow mechanism and suspected primary esophageal dysphagia however esophageal scan revealed questionable stasis and stenosis (dx of goiter pushing on trachea). Observed pt at bedside with regular solids and thin liquids with no overt s/s of aspiration. Daughter reported that pt intermittently pockets food and daughter performs oral sweep for clearance. Educated pt and daughter re: esophageal precautions (alternate solids/liquids, stay upright after meals) and advised pt and family to contact ST if any swallow difficulties or questions arise. Continue Dys 3 (mechanical soft) solids due to hx of oral holding/pocketing, thin liquids (straws ok), meds crushed. No further ST needs at this time; ST signing off.   HPI HPI: Ptis a 83 y.o.femalewith medical history significant of hyperthyroidism, HTN, migraine headache, left MCA stroke with residual right sided weakness and aphasia, and PE who presented to the ED with c/o cough and wheezing. Patient seen by PCP on 04/13/2016 and diagnosed with CAP and UTI. CXR showed no acute findings, stable large substernal goiter causing tracheal narrowing and deviation to the right.      SLP Plan  All goals met       Recommendations  Diet recommendations: Dysphagia 3 (mechanical soft);Thin liquid Liquids provided via: Cup;Straw Medication Administration: Crushed with puree Supervision: Staff to assist with self feeding;Intermittent supervision to cue for  compensatory strategies Compensations: Slow rate;Small sips/bites;Lingual sweep for clearance of pocketing;Minimize environmental distractions;Follow solids with liquid Postural Changes and/or Swallow Maneuvers: Seated upright 90 degrees;Upright 30-60 min after meal                Oral Care Recommendations: Oral care BID Follow up Recommendations: Skilled Nursing facility SLP Visit Diagnosis: Dysphagia, pharyngoesophageal phase (R13.14) Plan: All goals met       GO                Fransisca Kaufmann , Student-SLP 04/22/2016, 9:56 AM

## 2016-04-23 ENCOUNTER — Inpatient Hospital Stay (HOSPITAL_COMMUNITY): Payer: Medicare Other

## 2016-04-23 LAB — BASIC METABOLIC PANEL
Anion gap: 10 (ref 5–15)
BUN: 5 mg/dL — ABNORMAL LOW (ref 6–20)
CO2: 28 mmol/L (ref 22–32)
Calcium: 8.7 mg/dL — ABNORMAL LOW (ref 8.9–10.3)
Chloride: 104 mmol/L (ref 101–111)
Creatinine, Ser: 0.55 mg/dL (ref 0.44–1.00)
GFR calc Af Amer: >60 mL/min (ref >60)
GFR calc non Af Amer: >60 mL/min (ref >60)
Glucose, Bld: 102 mg/dL — ABNORMAL HIGH (ref 65–99)
Potassium: 2.3 mmol/L — CL (ref 3.5–5.1)
Sodium: 142 mmol/L (ref 135–145)

## 2016-04-23 LAB — CBC
HCT: 42.4 % (ref 36.0–46.0)
Hemoglobin: 13.5 g/dL (ref 12.0–15.0)
MCH: 35.4 pg — ABNORMAL HIGH (ref 26.0–34.0)
MCHC: 31.8 g/dL (ref 30.0–36.0)
MCV: 111.3 fL — ABNORMAL HIGH (ref 78.0–100.0)
Platelets: 472 10*3/uL — ABNORMAL HIGH (ref 150–400)
RBC: 3.81 MIL/uL — ABNORMAL LOW (ref 3.87–5.11)
RDW: 14.5 % (ref 11.5–15.5)
WBC: 7.3 10*3/uL (ref 4.0–10.5)

## 2016-04-23 LAB — CULTURE, BLOOD (ROUTINE X 2): Special Requests: ADEQUATE

## 2016-04-23 MED ORDER — SODIUM CHLORIDE 0.9 % IV SOLN
30.0000 meq | INTRAVENOUS | Status: DC
  Filled 2016-04-23 (×2): qty 15

## 2016-04-23 MED ORDER — SODIUM CHLORIDE 0.9 % IV SOLN
30.0000 meq | INTRAVENOUS | Status: AC
  Administered 2016-04-23 (×2): 30 meq via INTRAVENOUS
  Filled 2016-04-23 (×2): qty 15

## 2016-04-23 NOTE — Progress Notes (Addendum)
[facility] PROGRESS NOTE  Kimmi Acocella FIE:[id number] DOB: [birth year] DOA: 04/20/2016   PCP: [name], MD     Assessment/Plan:  83 y.o. female with medical history significant of Hyperthyroidism, HTN, migraine headache, h/o Large left MCA stroke with R hemiplegia, dysphagia and aphasia 2016, bed bound at baseline, h/o  PE who presented to the ED with c/o cough that has been going on for more than a week . Treated by PCP for community-acquired pneumonia with Augmentin without improvement.Chest XRay showed large stable goiter causing tracheal narrowing and deviation to the right, haziness in the lower lung field and blunting of the left costophrenic angle  Assessment and plan  -Aspiration pneumonia -very weak cough, likely aspiration given patient's history of previous stroke with residual dysphasia, multinodular goiter  CT neck 07/01/15 showed severe stable thyromegaly,  -appreciate SLP input, MBS -functional oropharyngeal swallow mechanism and suspected esophageal dysphagia, D3 diet recommended -repeat CXR with RLL infiltrate -continue nebs for pulm toilet, suction PRN, Zosyn day3 -FU on Esophagram to check esoph anatomy, poor candidate for EGD with resp status to r/o esophageal dysphagia -SLP following-on D3 diet  Multinodular goitre -Repeat CT soft tissue neck,  shows only slight interval growth of large goiter, she is not a good surgical candidate based on her CVA in 2016, may need RAI as outpatient, will d/w Dr.Ellison -Last TSH and freeT4 on 04/01/16 were normal on current dose of Methimazole -on CT chest  the SVC is displaced and effaced and has chronically occluded Left ICA/IJ  Hyperkalemia -s/p one dose of Kayexalate  Hypertension -Currently controlled, monitor  Spastic hemiparesis (right sided) and aphasia after left MCA stroke - bed/wheel chair bound at baseline -and h/o DVT and PE in 2016. -on eliquis at home, now on lovenox  Patient is on  Eliquis for PE, - currently on Lovenox  bid, resume eliquis when swallowing improved  Thrombocytosis  Followed by Dr Waldon Merl   DVT prophylaxsis Lovenox  Code Status:   Full code Family Communication: daughter at bedside Disposition Plan: home when improved     Consultants:  None  Procedures:  None  Antibiotics: Anti-infectives    Start     Dose/Rate Route Frequency Ordered Stop   04/20/16 2359  piperacillin-tazobactam (ZOSYN) IVPB 3.375 g     3.375 g 12.5 mL/hr over 240 Minutes Intravenous Every 8 hours 04/20/16 2341     04/20/16 1230  piperacillin-tazobactam (ZOSYN) IVPB 3.375 g     3.375 g 100 mL/hr over 30 Minutes Intravenous  Once 04/20/16 1227 04/20/16 1713         HPI/Subjective: Continues to cough, audible chest rattle noted, no significant changes per daughter  Objective: Vitals:   04/23/16 0201 04/23/16 0246 04/23/16 0549 04/23/16 0905  BP:   116/65   Pulse:   85   Resp:   16   Temp:   98.5 F (36.9 C)   TempSrc:   Oral   SpO2:  90% 93% 91%  Weight: 72.2 kg (159 lb 2.8 oz)       Intake/Output Summary (Last 24 hours) at 04/23/16 1225 Last data filed at 04/23/16 1100  Gross per 24 hour  Intake              240 ml  Output                0 ml  Net              240  ml    Exam:  Examination:   General: chronically ill female,more alert, no distress, aphasic Lungs:: diffuse ronchi, conducted upper airway sounds Cardiovascular system: S1 & S2 heard, RRR. No JVD, murmurs, rubs Abd: : Abdomen is nondistended, soft and nontender. Normal bowel sounds heard. Central nervous system: awake, aphasic, R hemiplegia. Extremities: R hemiplegia Skin: No rashes, lesions or ulcers Psychiatry: unable to assess    Data Reviewed: I have personally reviewed following labs and imaging studies  Micro Results Recent Results (from the past 240 hour(s))  Culture, blood (routine x 2) Call MD if unable to obtain prior to antibiotics being given     Status:  None (Preliminary result)   Collection Time: 04/20/16  7:35 PM  Result Value Ref Range Status   Specimen Description BLOOD LEFT ARM  Final   Special Requests IN PEDIATRIC BOTTLE BCAV  Final   Culture NO GROWTH 3 DAYS  Final   Report Status PENDING  Incomplete  Culture, blood (routine x 2) Call MD if unable to obtain prior to antibiotics being given     Status: Abnormal   Collection Time: 04/20/16  7:37 PM  Result Value Ref Range Status   Specimen Description BLOOD RIGHT HAND  Final   Special Requests IN PEDIATRIC BOTTLE Blood Culture adequate volume  Final   Culture  Setup Time   Final    GRAM POSITIVE COCCI IN CLUSTERS IN PEDIATRIC BOTTLE CRITICAL RESULT CALLED TO, READ BACK BY AND VERIFIED WITH: CARON AMEND,PHARMD @0025  04/22/16 MKELLY,MLT    Culture (A)  Final    STAPHYLOCOCCUS SPECIES (COAGULASE NEGATIVE) THE SIGNIFICANCE OF ISOLATING THIS ORGANISM FROM A SINGLE SET OF BLOOD CULTURES WHEN MULTIPLE SETS ARE DRAWN IS UNCERTAIN. PLEASE NOTIFY THE MICROBIOLOGY DEPARTMENT WITHIN ONE WEEK IF SPECIATION AND SENSITIVITIES ARE REQUIRED.    Report Status 04/23/2016 FINAL  Final    Radiology Reports Dg Chest 1 View  Result Date: 04/14/2016 CLINICAL DATA:  Chest congestion, cough, dysphagia for the past 5 days. History previous CVA, pulmonary embolism. Patient unable to raise the chin above the chest. EXAM: CHEST 1 VIEW COMPARISON:  Portable chest x-ray of May 31, 2015 and chest CT scan of May 31, 2015. FINDINGS: Again demonstrated is a large soft tissue mass which deviates the trachea to the right most compatible with a goiter which is been demonstrated on previous chest x-ray and CT scan. The lungs are well-expanded. There are stable calcifications in the left upper hemithorax which lie in the soft tissues superficial to the scapula on the previous CT scan. The cardiac silhouette remains enlarged. The pulmonary vascularity is not engorged. There is calcification in the wall of the aortic arch.  There is degenerative change of both shoulders. IMPRESSION: No acute pneumonia nor pulmonary edema.  Stable mild cardiomegaly. Thoracic aortic atherosclerosis. Large goiter with deviation of the trachea toward the right with narrowing of the tracheal lumen. This has progressed somewhat since the previous study. Electronically Signed   By: David  Martinique M.D.   On: 04/14/2016 08:28   Dg Chest 2 View  Result Date: 04/22/2016 CLINICAL DATA:  Acute onset of cough.  Initial encounter. EXAM: CHEST  2 VIEW COMPARISON:  Chest radiograph performed 04/20/2016 FINDINGS: The lungs are well-aerated. Right perihilar opacity is new from the study performed 2 days ago, and may reflect pneumonia. There is no evidence of pleural effusion or pneumothorax. Underlying vascular congestion is noted. As before, a very large thyroid goiter displaces the trachea to the right, with tracheal narrowing.  This finding is stable from 2012. The heart is mildly enlarged. No acute osseous abnormalities are seen. Degenerative change is noted at the glenohumeral joints. IMPRESSION: 1. Right perihilar opacity is new from the study performed 2 days ago, and may reflect pneumonia. Underlying vascular congestion and mild cardiomegaly. 2. Very large thyroid goiter again noted, displacing the trachea to the right, with tracheal narrowing. This is stable from 2012. Electronically Signed   By: Garald Balding M.D.   On: 04/22/2016 19:47   Dg Chest 2 View  Result Date: 04/20/2016 CLINICAL DATA:  Cough and shortness of breath. EXAM: CHEST  2 VIEW COMPARISON:  04/13/2016 and chest CT on 05/31/2015 FINDINGS: Heart size is stable. Large mediastinal mass resulting in tracheal narrowing and deviation to the right is stable, and consistent with large substernal goiter, as demonstrated on previous CT. No evidence of pulmonary infiltrate or edema. Limited visualization on lateral projection due to position of patient's arms. IMPRESSION: No acute findings. Stable  large substernal goiter causing tracheal narrowing and deviation to the right. Electronically Signed   By: Earle Gell M.D.   On: 04/20/2016 10:13   Ct Soft Tissue Neck W Contrast  Result Date: 04/21/2016 CLINICAL DATA:  Continued surveillance multinodular goiter. EXAM: CT NECK WITH CONTRAST TECHNIQUE: Multidetector CT imaging of the neck was performed using the standard protocol following the bolus administration of intravenous contrast. CONTRAST:  51mL ISOVUE-300 IOPAMIDOL (ISOVUE-300) INJECTION 61% COMPARISON:  07/01/2015. FINDINGS: Pharynx and larynx: Normal pharyngeal structures. Severe rightward displacement of the proximal trachea and larynx due to the leftward dominant multinodular goiter. This may be increased from prior of June 2017. Salivary glands: No inflammation, mass, or stone. Thyroid: Markedly enlarged and heterogeneous thyroid, with multiple nodules and scattered calcifications, significant substernal extent. In addition to mass effect on the trachea, the SVC is displaced and effaced. Approximate measurements are 97 x 45 x 68 mm, although the entire craniocaudal extent is not assessed on this CT neck study. Lymph nodes: None enlarged or abnormal density. Vascular: Occluded LEFT ICA. Calcifications in the LEFT internal jugular are redemonstrated consistent with chronic thrombosis. Limited intracranial: LEFT temporal lobe encephalomalacia better visualized on previous exam. Visualized orbits: Not visualized. Mastoids and visualized paranasal sinuses: Clear. Skeleton: No acute osseous findings. Cervical spondylosis. Nuchal ligament ossification. Upper chest: No pulmonary nodule or pneumothorax. Other: None. IMPRESSION: Slight interval growth since 2017 of large goiter. Significant LEFT-to-RIGHT mass effect on the trachea and SVC; see discussion above. Electronically Signed   By: Staci Righter M.D.   On: 04/21/2016 13:19   Dg Swallowing Func-speech Pathology  Result Date: 04/21/2016 Objective  Swallowing Evaluation: Type of Study: MBS-Modified Barium Swallow Study Patient Details Name: Noora Locascio MRN: [id number] Date of Birth: [birth year] Today's Date: 04/21/2016 Time: SLP Start Time (ACUTE ONLY): 1039-SLP Stop Time (ACUTE ONLY): 1053 SLP Time Calculation (min) (ACUTE ONLY): 14 min Past Medical History: Past Medical History: Diagnosis Date . ARTHRITIS  . Arthritis  . Diverticulosis  . HYPERTENSION  . HYPERTHYROIDISM  . Hyperthyroidism   s/p I-131 ablation 03/2011 of multinod goiter . INSOMNIA  . MIGRAINE HEADACHE  . OBESITY  . Posttraumatic stress disorder  . Pulmonary embolism ([place]) 03/2014  with DVT . Stroke [facility]) 03/2014  dysarthria Past Surgical History: Past Surgical History: Procedure Laterality Date . ABDOMINAL HYSTERECTOMY  1976 . BREAST SURGERY    biopsy . RADIOLOGY WITH ANESTHESIA Left 03/08/2014  Procedure: RADIOLOGY WITH ANESTHESIA;  Surgeon: Rob Hickman, MD;  Location: [place] [facility];  Service: Radiology;  Laterality: Left; . TONSILLECTOMY   . TOTAL HIP ARTHROPLASTY  1998   right HPI: Ptis a 83 y.o.femalewith medical history significant of hyperthyroidism, HTN, migraine headache, left MCA stroke with residual right sided weakness and aphasia, and PE who presented to the ED with c/o cough and wheezing. Patient seen by PCP on 04/13/2016 and diagnosed with CAP and UTI. CXR showed no acute findings, stable large substernal goiter causing tracheal narrowing and deviation to the right. No Data Recorded Assessment / Plan / Recommendation CHL IP CLINICAL IMPRESSIONS 04/21/2016 Clinical Impression Ms. Sliger presents with a mild oral/pharyngeal dysphagia, suspect primary esophageal dysphagia. Oral phase impairments included mild prolonged mastication with regular solids (pt wears partial dentures). Observed mild vallecular and pyriform sinus residue with thin liquids via cup/straw due to reduced laryngeal elevation and epiglottic inversion. MBS does not diagnose below the level of the UES however scan  of esophagus revealed stasis within the mid-esophagus and questionable stenosis. Pt previously diagnosed with a substernal goiter causing tracheal narrowing and deviation to the right possible causing observations. Educated pt re: results of MBS, diet recommendations, esophageal precautions (alternate solids/liquids, sit upright after meals), and advised pt to consider GI consult due to esophageal findings. Recommend Dys 3 solids, thin liquids (straws ok), meds crushed. ST will f/u briefly to assess diet tolerance and for continued education. SLP Visit Diagnosis Dysphagia, pharyngoesophageal phase (R13.14) Attention and concentration deficit following -- Frontal lobe and executive function deficit following -- Impact on safety and function Mild aspiration risk   CHL IP TREATMENT RECOMMENDATION 04/21/2016 Treatment Recommendations Therapy as outlined in treatment plan below   Prognosis 04/21/2016 Prognosis for Safe Diet Advancement Good Barriers to Reach Goals -- Barriers/Prognosis Comment -- CHL IP DIET RECOMMENDATION 04/21/2016 SLP Diet Recommendations Dysphagia 3 (Mech soft) solids;Thin liquid Liquid Administration via Cup;Straw Medication Administration Crushed with puree Compensations Slow rate;Small sips/bites;Follow solids with liquid Postural Changes Seated upright at 90 degrees   CHL IP OTHER RECOMMENDATIONS 04/21/2016 Recommended Consults Consider GI evaluation;Consider esophageal assessment Oral Care Recommendations Oral care BID Other Recommendations --   CHL IP FOLLOW UP RECOMMENDATIONS 04/21/2016 Follow up Recommendations Skilled Nursing facility;24 hour supervision/assistance   CHL IP FREQUENCY AND DURATION 04/21/2016 Speech Therapy Frequency (ACUTE ONLY) min 1 x/week Treatment Duration 1 week      CHL IP ORAL PHASE 04/21/2016 Oral Phase Impaired Oral - Pudding Teaspoon -- Oral - Pudding Cup -- Oral - Honey Teaspoon -- Oral - Honey Cup -- Oral - Nectar Teaspoon -- Oral - Nectar Cup -- Oral - Nectar Straw --  Oral - Thin Teaspoon -- Oral - Thin Cup WFL Oral - Thin Straw WFL Oral - Puree -- Oral - Mech Soft -- Oral - Regular (No Data) Oral - Multi-Consistency -- Oral - Pill -- Oral Phase - Comment --  CHL IP PHARYNGEAL PHASE 04/21/2016 Pharyngeal Phase Impaired Pharyngeal- Pudding Teaspoon -- Pharyngeal -- Pharyngeal- Pudding Cup -- Pharyngeal -- Pharyngeal- Honey Teaspoon -- Pharyngeal -- Pharyngeal- Honey Cup -- Pharyngeal -- Pharyngeal- Nectar Teaspoon -- Pharyngeal -- Pharyngeal- Nectar Cup -- Pharyngeal -- Pharyngeal- Nectar Straw -- Pharyngeal -- Pharyngeal- Thin Teaspoon -- Pharyngeal -- Pharyngeal- Thin Cup Pharyngeal residue - valleculae;Pharyngeal residue - pyriform;Reduced laryngeal elevation;Reduced epiglottic inversion Pharyngeal -- Pharyngeal- Thin Straw Pharyngeal residue - valleculae;Pharyngeal residue - pyriform;Reduced laryngeal elevation;Reduced epiglottic inversion Pharyngeal -- Pharyngeal- Puree -- Pharyngeal -- Pharyngeal- Mechanical Soft -- Pharyngeal -- Pharyngeal- Regular Pharyngeal residue - valleculae;Reduced epiglottic inversion;Reduced laryngeal elevation Pharyngeal -- Pharyngeal- Multi-consistency -- Pharyngeal -- Pharyngeal- Pill --  Pharyngeal -- Pharyngeal Comment --  CHL IP CERVICAL ESOPHAGEAL PHASE 04/21/2016 Cervical Esophageal Phase WFL Pudding Teaspoon -- Pudding Cup -- Honey Teaspoon -- Honey Cup -- Nectar Teaspoon -- Nectar Cup -- Nectar Straw -- Thin Teaspoon -- Thin Cup -- Thin Straw -- Puree -- Mechanical Soft -- Regular -- Multi-consistency -- Pill -- Cervical Esophageal Comment -- CHL IP GO 08/08/2015 Functional Assessment Tool Used mbs Functional Limitations Swallowing Swallow Current Status (G2836) CJ Swallow Goal Status (O2947) CJ Swallow Discharge Status (M5465) CJ Motor Speech Current Status (K3546) (None) Motor Speech Goal Status (F6812) (None) Motor Speech Goal Status (X5170) (None) Spoken Language Comprehension Current Status (Y1749) (None) Spoken Language Comprehension Goal  Status (S4967) (None) Spoken Language Comprehension Discharge Status (R9163) (None) Spoken Language Expression Current Status (W4665) (None) Spoken Language Expression Goal Status (L9357) (None) Spoken Language Expression Discharge Status (S1779) (None) Attention Current Status (T9030) (None) Attention Goal Status (S9233) (None) Attention Discharge Status (A0762) (None) Memory Current Status (U6333) (None) Memory Goal Status (L4562) (None) Memory Discharge Status (B6389) (None) Voice Current Status (H7342) (None) Voice Goal Status (A7681) (None) Voice Discharge Status (L5726) (None) Other Speech-Language Pathology Functional Limitation Current Status (O0355) (None) Other Speech-Language Pathology Functional Limitation Goal Status (H7416) (None) Other Speech-Language Pathology Functional Limitation Discharge Status [phone]) (None) [place] Siren 04/21/2016, 2:50 PM Orbie Pyo Litaker M.Ed CCC-SLP Pager [phone]                CBC  Recent Labs Lab 04/20/16 0945 04/20/16 0949 04/21/16 0614 04/22/16 0607 04/23/16 0401  WBC 5.8  --  5.6 6.5 7.3  HGB 13.5 15.0 13.9 14.1 13.5  HCT 42.0 44.0 43.1 43.6 42.4  PLT 455*  --  435* 456* 472*  MCV 111.4*  --  111.4* 111.5* 111.3*  MCH 35.8*  --  35.9* 36.1* 35.4*  MCHC 32.1  --  32.3 32.3 31.8  RDW 14.3  --  14.8 14.7 14.5  LYMPHSABS 1.5  --   --   --   --   MONOABS 0.3  --   --   --   --   EOSABS 0.2  --   --   --   --   BASOSABS 0.1  --   --   --   --     Chemistries   Recent Labs Lab 04/20/16 0945 04/20/16 0949 04/21/16 0614 04/21/16 1453 04/23/16 0401  NA 140 140 139 142 142  K 4.6 4.3 6.1* 4.2 2.3*  CL 107 104 107 106 104  CO2 27  --  22 24 28   GLUCOSE 84 87 74 110* 102*  BUN 9 10 8  5* 5*  CREATININE 0.42* 0.80 0.43* 0.42* 0.55  CALCIUM 8.9  --  8.9 8.9 8.7*   ------------------------------------------------------------------------------------------------------------------ estimated creatinine clearance is 48.9 mL/min (by C-G  formula based on SCr of 0.55 mg/dL). ------------------------------------------------------------------------------------------------------------------ No results for input(s): HGBA1C in the last 72 hours. ------------------------------------------------------------------------------------------------------------------ No results for input(s): CHOL, HDL, LDLCALC, TRIG, CHOLHDL, LDLDIRECT in the last 72 hours. ------------------------------------------------------------------------------------------------------------------ No results for input(s): TSH, T4TOTAL, T3FREE, THYROIDAB in the last 72 hours.  Invalid input(s): FREET3 ------------------------------------------------------------------------------------------------------------------ No results for input(s): VITAMINB12, FOLATE, FERRITIN, TIBC, IRON, RETICCTPCT in the last 72 hours.  Coagulation profile No results for input(s): INR, PROTIME in the last 168 hours.  No results for input(s): DDIMER in the last 72 hours.  Cardiac Enzymes No results for input(s): CKMB, TROPONINI, MYOGLOBIN in the last 168 hours.  Invalid input(s): CK ------------------------------------------------------------------------------------------------------------------ Invalid input(s): POCBNP  CBG: No results for input(s): GLUCAP in the last 168 hours.     Studies: Dg Chest 2 View  Result Date: 04/22/2016 CLINICAL DATA:  Acute onset of cough.  Initial encounter. EXAM: CHEST  2 VIEW COMPARISON:  Chest radiograph performed 04/20/2016 FINDINGS: The lungs are well-aerated. Right perihilar opacity is new from the study performed 2 days ago, and may reflect pneumonia. There is no evidence of pleural effusion or pneumothorax. Underlying vascular congestion is noted. As before, a very large thyroid goiter displaces the trachea to the right, with tracheal narrowing. This finding is stable from 2012. The heart is mildly enlarged. No acute osseous abnormalities are  seen. Degenerative change is noted at the glenohumeral joints. IMPRESSION: 1. Right perihilar opacity is new from the study performed 2 days ago, and may reflect pneumonia. Underlying vascular congestion and mild cardiomegaly. 2. Very large thyroid goiter again noted, displacing the trachea to the right, with tracheal narrowing. This is stable from 2012. Electronically Signed   By: Garald Balding M.D.   On: 04/22/2016 19:47   Ct Soft Tissue Neck W Contrast  Result Date: 04/21/2016 CLINICAL DATA:  Continued surveillance multinodular goiter. EXAM: CT NECK WITH CONTRAST TECHNIQUE: Multidetector CT imaging of the neck was performed using the standard protocol following the bolus administration of intravenous contrast. CONTRAST:  89mL ISOVUE-300 IOPAMIDOL (ISOVUE-300) INJECTION 61% COMPARISON:  07/01/2015. FINDINGS: Pharynx and larynx: Normal pharyngeal structures. Severe rightward displacement of the proximal trachea and larynx due to the leftward dominant multinodular goiter. This may be increased from prior of June 2017. Salivary glands: No inflammation, mass, or stone. Thyroid: Markedly enlarged and heterogeneous thyroid, with multiple nodules and scattered calcifications, significant substernal extent. In addition to mass effect on the trachea, the SVC is displaced and effaced. Approximate measurements are 97 x 45 x 68 mm, although the entire craniocaudal extent is not assessed on this CT neck study. Lymph nodes: None enlarged or abnormal density. Vascular: Occluded LEFT ICA. Calcifications in the LEFT internal jugular are redemonstrated consistent with chronic thrombosis. Limited intracranial: LEFT temporal lobe encephalomalacia better visualized on previous exam. Visualized orbits: Not visualized. Mastoids and visualized paranasal sinuses: Clear. Skeleton: No acute osseous findings. Cervical spondylosis. Nuchal ligament ossification. Upper chest: No pulmonary nodule or pneumothorax. Other: None. IMPRESSION:  Slight interval growth since 2017 of large goiter. Significant LEFT-to-RIGHT mass effect on the trachea and SVC; see discussion above. Electronically Signed   By: Staci Righter M.D.   On: 04/21/2016 13:19      Lab Results  Component Value Date   HGBA1C 5.4 03/09/2014   Lab Results  Component Value Date   LDLCALC 53 03/09/2014   CREATININE 0.55 04/23/2016       Scheduled Meds: . chlorhexidine  15 mL Mouth Rinse BID  . diclofenac sodium  2 g Topical QID  . enoxaparin (LOVENOX) injection  80 mg Subcutaneous Q12H  . ipratropium-albuterol  3 mL Nebulization Q6H  . mouth rinse  15 mL Mouth Rinse q12n4p  . metoprolol  2.5 mg Intravenous Q6H   Continuous Infusions: . piperacillin-tazobactam (ZOSYN)  IV    . potassium chloride (KCL MULTIRUN) 30 mEq in 265 mL IVPB 30 mEq (04/23/16 1107)     LOS: 3 days    Time spent: >30 MINS    Shanicka Oldenkamp  [facility] Page via [email or web address], password [email or web address]  If 7PM-7AM, please contact night-coverage at [email or web address], password [facility] 04/23/2016, 12:25 PM  LOS: 3 days

## 2016-04-23 NOTE — Progress Notes (Signed)
ANTICOAGULATION CONSULT NOTE - Follow-Up Consult  Pharmacy Consult for lovenox Indication: history of  pulmonary embolus  No Known Allergies  Patient Measurements: Weight: 159 lb 2.8 oz (72.2 kg)  Vital Signs: Temp: 98.6 F (37 C) (04/19 0900) Temp Source: Axillary (04/19 0900) BP: 110/64 (04/19 0900) Pulse Rate: 88 (04/19 0900)  Labs:  Recent Labs  04/21/16 0614 04/21/16 1453 04/22/16 0607 04/23/16 0401  HGB 13.9  --  14.1 13.5  HCT 43.1  --  43.6 42.4  PLT 435*  --  456* 472*  CREATININE 0.43* 0.42*  --  0.55    Estimated Creatinine Clearance: 48.9 mL/min (by C-G formula based on SCr of 0.55 mg/dL).   Medical History: Past Medical History:  Diagnosis Date  . ARTHRITIS   . Arthritis   . Diverticulosis   . HYPERTENSION   . HYPERTHYROIDISM   . Hyperthyroidism    s/p I-131 ablation 03/2011 of multinod goiter  . INSOMNIA   . MIGRAINE HEADACHE   . OBESITY   . Posttraumatic stress disorder   . Pulmonary embolism ([place]) 03/2014   with DVT  . Stroke [facility]) 03/2014   dysarthria   Assessment: 83 yo F presented to the ED with cough and being treated for aspiration PNA. She is on chronic apixaban for history of PE but was transitioned to Lovenox while NPO pending speech/swallow eval and diet recommendations.  No bleeding noted.  Goal of Therapy:  Anti-Xa level 0.6-1 units/ml 4hrs after LMWH dose given Monitor platelets by anticoagulation protocol: Yes   Plan:  Continue Lovenox 80mg  SQ Q12H CBC Q72H F/u ability to restart apixaban  [employer], Pharm.D., BCPS Clinical Pharmacist Pager [phone] 04/23/2016 2:51 PM

## 2016-04-24 ENCOUNTER — Telehealth: Payer: Self-pay | Admitting: Endocrinology

## 2016-04-24 LAB — BASIC METABOLIC PANEL WITH GFR
Anion gap: 13 (ref 5–15)
BUN: 7 mg/dL (ref 6–20)
CO2: 24 mmol/L (ref 22–32)
Calcium: 8.9 mg/dL (ref 8.9–10.3)
Chloride: 107 mmol/L (ref 101–111)
Creatinine, Ser: 0.38 mg/dL — ABNORMAL LOW (ref 0.44–1.00)
GFR calc Af Amer: >60 mL/min
GFR calc non Af Amer: >60 mL/min
Glucose, Bld: 98 mg/dL (ref 65–99)
Potassium: 3.3 mmol/L — ABNORMAL LOW (ref 3.5–5.1)
Sodium: 144 mmol/L (ref 135–145)

## 2016-04-24 LAB — CBC
HCT: 38.3 % (ref 36.0–46.0)
Hemoglobin: 12.4 g/dL (ref 12.0–15.0)
MCH: 35.9 pg — ABNORMAL HIGH (ref 26.0–34.0)
MCHC: 32.4 g/dL (ref 30.0–36.0)
MCV: 111 fL — ABNORMAL HIGH (ref 78.0–100.0)
Platelets: 434 10*3/uL — ABNORMAL HIGH (ref 150–400)
RBC: 3.45 MIL/uL — ABNORMAL LOW (ref 3.87–5.11)
RDW: 14.7 % (ref 11.5–15.5)
WBC: 8.3 10*3/uL (ref 4.0–10.5)

## 2016-04-24 MED ORDER — GUAIFENESIN ER 600 MG PO TB12
600.0000 mg | ORAL_TABLET | Freq: Two times a day (BID) | ORAL | Status: DC
  Administered 2016-04-24 – 2016-04-29 (×11): 600 mg via ORAL
  Filled 2016-04-24 (×11): qty 1

## 2016-04-24 MED ORDER — SODIUM CHLORIDE 0.9 % IV SOLN
30.0000 meq | INTRAVENOUS | Status: AC
  Administered 2016-04-24: 30 meq via INTRAVENOUS
  Filled 2016-04-24: qty 15

## 2016-04-24 NOTE — Care Management Important Message (Signed)
Important Message  Patient Details  Name: Sandy Barrett MRN: [id number] Date of Birth: 17-Jun-1932   Medicare Important Message Given:  Yes    Meredyth Hornung Montine Circle 04/24/2016, 3:22 PM

## 2016-04-24 NOTE — Telephone Encounter (Signed)
For the radioactive iodine, they would tell you to stay away for 3 days.  However, the risk is miniscule.  In my opinion, the radioactive iodine is the safest option.

## 2016-04-24 NOTE — Telephone Encounter (Signed)
Please discuss with dr in the hospital

## 2016-04-24 NOTE — Telephone Encounter (Signed)
Patients daughter informed of Provider instructions. If the daughter decides to move forward with this for the patient what steps does she need to do to get this going/ordered.

## 2016-04-24 NOTE — Telephone Encounter (Signed)
The daughter would like to the specifics of the risk to her as caregiver that could possible happen to her staying with her mom for the 3 days. She is not convinced for her safety (as caregiver) is this the best option

## 2016-04-24 NOTE — Progress Notes (Signed)
[facility] PROGRESS NOTE  Sandy Barrett FFM:[id number] DOB: 1932/09/23 DOA: 04/20/2016   PCP: [name], MD  Assessment/Plan:  83 y.o. 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 Found to have aspiration pneumonia and dysphagia  Assessment and plan  -Aspiration pneumonia -has multifactorial dysphagia -due to advanced age, Debility, stroke in 2016 and large multinodular goitre -s/p SLP evaluation, on D3 diet now -continue IV Zosyn, nebs for pulm toilet, NT suctioning PRN etc -see discussion on goitre below -I discussed the need for Palliative care discussions and limited options with dysphagia/aspiration pneumonia and limited options for multinodular goitre as discussed below -Palliative consult for Goals of care  Multinodular goitre -Repeat CT soft tissue neck,  shows only slight interval growth of large goiter, no significant change compared to 2017 she is not a good surgical candidate she has already seen [facility] in the past and they said this, I called and d/w Dr.Sean Loanne Drilling and he felt that due to her debilitated status she wouldn't be able to manage isolation for 5days and radioactive iodine at this time, daughter has more questions abt Radioactive iodone, I have asked her to talk to [place] abt this -Last TSH and freeT4 on 04/01/16 were normal on current dose of Methimazole -on CT chest  the SVC is displaced and effaced and has chronically occluded Left ICA/IJ  Hyperkalemia -s/p one dose of Kayexalate  Hypertension -Currently controlled, monitor  Large MCA stroke with Spastic hemiparesis (right sided), dysphagia and aphasia - bed/wheel chair bound at baseline -and h/o DVT and  PE in 2016. -on eliquis at home, now on lovenox  Patient is on Eliquis for PE, - currently on Lovenox  bid, resume eliquis when swallowing improved  Thrombocytosis  Followed by Dr Waldon Merl   DVT prophylaxsis Lovenox  Code Status:   Full code Family Communication: daughter at bedside Disposition Plan: to be determined, suspect home in few days if stable     Consultants: Palliative consult  Procedures:  None  Antibiotics: Anti-infectives    Start     Dose/Rate Route Frequency Ordered Stop   04/20/16 2359  piperacillin-tazobactam (ZOSYN) IVPB 3.375 g     3.375 g 12.5 mL/hr over 240 Minutes Intravenous Every 8 hours 04/20/16 2341     04/20/16 1230  piperacillin-tazobactam (ZOSYN) IVPB 3.375 g     3.375 g 100 mL/hr over 30 Minutes Intravenous  Once 04/20/16 1227 04/20/16 1713         HPI/Subjective: Continues to have audible chest rattle noted, no significant changes per daughter  Objective: Vitals:   04/24/16 0452 04/24/16 0455 04/24/16 0827 04/24/16 0937  BP: 109/73     Pulse: (!) 105     Resp: 18     Temp: 97.7 F (36.5 C)     TempSrc:      SpO2: 92%  (!) 84% 98%  Weight:  75.9 kg (167 lb 4.8 oz)      Intake/Output Summary (Last 24 hours) at 04/24/16 1147 Last data filed at 04/24/16 0125  Gross per 24 hour  Intake            466.6 ml  Output  0 ml  Net            466.6 ml    Exam:  Examination:   General: chronically ill female, sitting in chair, alert, no distress, aphasic Lungs:: diffuse ronchi, conducted upper airway sounds Cardiovascular system: S1 & S2 heard, RRR. No JVD, murmurs, rubs Abd: : Abdomen is nondistended, soft and nontender. Normal bowel sounds heard. Central nervous system: awake, aphasic, R hemiplegia. Extremities: R hemiplegia Skin: No rashes, lesions or ulcers Psychiatry: unable to assess    Data Reviewed: I have personally reviewed following labs and imaging studies  Micro Results Recent Results (from  the past 240 hour(s))  Culture, blood (routine x 2) Call MD if unable to obtain prior to antibiotics being given     Status: None (Preliminary result)   Collection Time: 04/20/16  7:35 PM  Result Value Ref Range Status   Specimen Description BLOOD LEFT ARM  Final   Special Requests IN PEDIATRIC BOTTLE BCAV  Final   Culture NO GROWTH 3 DAYS  Final   Report Status PENDING  Incomplete  Culture, blood (routine x 2) Call MD if unable to obtain prior to antibiotics being given     Status: Abnormal   Collection Time: 04/20/16  7:37 PM  Result Value Ref Range Status   Specimen Description BLOOD RIGHT HAND  Final   Special Requests IN PEDIATRIC BOTTLE Blood Culture adequate volume  Final   Culture  Setup Time   Final    GRAM POSITIVE COCCI IN CLUSTERS IN PEDIATRIC BOTTLE CRITICAL RESULT CALLED TO, READ BACK BY AND VERIFIED WITH: CARON AMEND,PHARMD @0025  04/22/16 MKELLY,MLT    Culture (A)  Final    STAPHYLOCOCCUS SPECIES (COAGULASE NEGATIVE) THE SIGNIFICANCE OF ISOLATING THIS ORGANISM FROM A SINGLE SET OF BLOOD CULTURES WHEN MULTIPLE SETS ARE DRAWN IS UNCERTAIN. PLEASE NOTIFY THE MICROBIOLOGY DEPARTMENT WITHIN ONE WEEK IF SPECIATION AND SENSITIVITIES ARE REQUIRED.    Report Status 04/23/2016 FINAL  Final    Radiology Reports Dg Chest 1 View  Result Date: 04/14/2016 CLINICAL DATA:  Chest congestion, cough, dysphagia for the past 5 days. History previous CVA, pulmonary embolism. Patient unable to raise the chin above the chest. EXAM: CHEST 1 VIEW COMPARISON:  Portable chest x-ray of May 31, 2015 and chest CT scan of May 31, 2015. FINDINGS: Again demonstrated is a large soft tissue mass which deviates the trachea to the right most compatible with a goiter which is been demonstrated on previous chest x-ray and CT scan. The lungs are well-expanded. There are stable calcifications in the left upper hemithorax which lie in the soft tissues superficial to the scapula on the previous CT scan. The cardiac  silhouette remains enlarged. The pulmonary vascularity is not engorged. There is calcification in the wall of the aortic arch. There is degenerative change of both shoulders. IMPRESSION: No acute pneumonia nor pulmonary edema.  Stable mild cardiomegaly. Thoracic aortic atherosclerosis. Large goiter with deviation of the trachea toward the right with narrowing of the tracheal lumen. This has progressed somewhat since the previous study. Electronically Signed   By: David  Martinique M.D.   On: 04/14/2016 08:28   Dg Chest 2 View  Result Date: 04/22/2016 CLINICAL DATA:  Acute onset of cough.  Initial encounter. EXAM: CHEST  2 VIEW COMPARISON:  Chest radiograph performed 04/20/2016 FINDINGS: The lungs are well-aerated. Right perihilar opacity is new from the study performed 2 days ago, and may reflect pneumonia. There is no evidence of pleural effusion or pneumothorax. Underlying vascular  congestion is noted. As before, a very large thyroid goiter displaces the trachea to the right, with tracheal narrowing. This finding is stable from 2012. The heart is mildly enlarged. No acute osseous abnormalities are seen. Degenerative change is noted at the glenohumeral joints. IMPRESSION: 1. Right perihilar opacity is new from the study performed 2 days ago, and may reflect pneumonia. Underlying vascular congestion and mild cardiomegaly. 2. Very large thyroid goiter again noted, displacing the trachea to the right, with tracheal narrowing. This is stable from 2012. Electronically Signed   By: Garald Balding M.D.   On: 04/22/2016 19:47   Dg Chest 2 View  Result Date: 04/20/2016 CLINICAL DATA:  Cough and shortness of breath. EXAM: CHEST  2 VIEW COMPARISON:  04/13/2016 and chest CT on 05/31/2015 FINDINGS: Heart size is stable. Large mediastinal mass resulting in tracheal narrowing and deviation to the right is stable, and consistent with large substernal goiter, as demonstrated on previous CT. No evidence of pulmonary infiltrate  or edema. Limited visualization on lateral projection due to position of patient's arms. IMPRESSION: No acute findings. Stable large substernal goiter causing tracheal narrowing and deviation to the right. Electronically Signed   By: Earle Gell M.D.   On: 04/20/2016 10:13   Ct Soft Tissue Neck W Contrast  Result Date: 04/21/2016 CLINICAL DATA:  Continued surveillance multinodular goiter. EXAM: CT NECK WITH CONTRAST TECHNIQUE: Multidetector CT imaging of the neck was performed using the standard protocol following the bolus administration of intravenous contrast. CONTRAST:  62mL ISOVUE-300 IOPAMIDOL (ISOVUE-300) INJECTION 61% COMPARISON:  07/01/2015. FINDINGS: Pharynx and larynx: Normal pharyngeal structures. Severe rightward displacement of the proximal trachea and larynx due to the leftward dominant multinodular goiter. This may be increased from prior of June 2017. Salivary glands: No inflammation, mass, or stone. Thyroid: Markedly enlarged and heterogeneous thyroid, with multiple nodules and scattered calcifications, significant substernal extent. In addition to mass effect on the trachea, the SVC is displaced and effaced. Approximate measurements are 97 x 45 x 68 mm, although the entire craniocaudal extent is not assessed on this CT neck study. Lymph nodes: None enlarged or abnormal density. Vascular: Occluded LEFT ICA. Calcifications in the LEFT internal jugular are redemonstrated consistent with chronic thrombosis. Limited intracranial: LEFT temporal lobe encephalomalacia better visualized on previous exam. Visualized orbits: Not visualized. Mastoids and visualized paranasal sinuses: Clear. Skeleton: No acute osseous findings. Cervical spondylosis. Nuchal ligament ossification. Upper chest: No pulmonary nodule or pneumothorax. Other: None. IMPRESSION: Slight interval growth since 2017 of large goiter. Significant LEFT-to-RIGHT mass effect on the trachea and SVC; see discussion above. Electronically Signed    By: Staci Righter M.D.   On: 04/21/2016 13:19   Dg Esophagus  Result Date: 04/23/2016 CLINICAL DATA:  Dysphagia.  Substernal goiter. EXAM: ESOPHOGRAM/BARIUM SWALLOW TECHNIQUE: Single contrast examination was performed using  thin barium. FLUOROSCOPY TIME:  Fluoroscopy Time: 1 minutes 48 seconds ; low dose pulsed fluoroscopy Radiation Exposure Index (if provided by the fluoroscopic device): 15.6 mGy Number of Acquired Spot Images: 0 COMPARISON:  None. FINDINGS: Technically difficult exam as patient could not drink from cup or straw, and thin barium was administered orally by syringe. The patient could only take small swallows of barium. A large substernal goiter is seen causing marked displacement of the esophagus to the right. A smooth stricture of the upper esophagus is seen at level thoracic inlet, likely due to external compression by substernal goiter. This does not obstruct the passage of thin barium. No evidence of distal esophageal stricture  or hiatal hernia. Lack of primary peristalsis resulting in esophageal stasis. IMPRESSION: Technically difficult exam. Large substernal goiter causes marked displacement of esophagus to the right. Smooth stricture of the upper esophagus at level of thoracic inlet is likely due to external compression by substernal goiter. Electronically Signed   By: Earle Gell M.D.   On: 04/23/2016 14:45   Dg Swallowing Func-speech Pathology  Result Date: 04/21/2016 Objective Swallowing Evaluation: Type of Study: MBS-Modified Barium Swallow Study Patient Details Name: Alycen Mack MRN: [id number] Date of Birth: 1932/03/03 Today's Date: 04/21/2016 Time: SLP Start Time (ACUTE ONLY): 1039-SLP Stop Time (ACUTE ONLY): 1053 SLP Time Calculation (min) (ACUTE ONLY): 14 min Past Medical History: Past Medical History: Diagnosis Date . ARTHRITIS  . Arthritis  . Diverticulosis  . HYPERTENSION  . HYPERTHYROIDISM  . Hyperthyroidism   s/p I-131 ablation 03/2011 of multinod goiter . INSOMNIA  .  MIGRAINE HEADACHE  . OBESITY  . Posttraumatic stress disorder  . Pulmonary embolism ([place]) 03/2014  with DVT . Stroke [facility]) 03/2014  dysarthria Past Surgical History: Past Surgical History: Procedure Laterality Date . ABDOMINAL HYSTERECTOMY  1976 . BREAST SURGERY    biopsy . RADIOLOGY WITH ANESTHESIA Left 03/08/2014  Procedure: RADIOLOGY WITH ANESTHESIA;  Surgeon: Rob Hickman, MD;  Location: [facility];  Service: Radiology;  Laterality: Left; . TONSILLECTOMY   . TOTAL HIP ARTHROPLASTY  1998   right HPI: Ptis a 83 y.o.femalewith medical history significant of hyperthyroidism, HTN, migraine headache, left MCA stroke with residual right sided weakness and aphasia, and PE who presented to the ED with c/o cough and wheezing. Patient seen by PCP on 04/13/2016 and diagnosed with CAP and UTI. CXR showed no acute findings, stable large substernal goiter causing tracheal narrowing and deviation to the right. No Data Recorded Assessment / Plan / Recommendation CHL IP CLINICAL IMPRESSIONS 04/21/2016 Clinical Impression Ms. Calder presents with a mild oral/pharyngeal dysphagia, suspect primary esophageal dysphagia. Oral phase impairments included mild prolonged mastication with regular solids (pt wears partial dentures). Observed mild vallecular and pyriform sinus residue with thin liquids via cup/straw due to reduced laryngeal elevation and epiglottic inversion. MBS does not diagnose below the level of the UES however scan of esophagus revealed stasis within the mid-esophagus and questionable stenosis. Pt previously diagnosed with a substernal goiter causing tracheal narrowing and deviation to the right possible causing observations. Educated pt re: results of MBS, diet recommendations, esophageal precautions (alternate solids/liquids, sit upright after meals), and advised pt to consider GI consult due to esophageal findings. Recommend Dys 3 solids, thin liquids (straws ok), meds crushed. ST will f/u briefly to assess diet  tolerance and for continued education. SLP Visit Diagnosis Dysphagia, pharyngoesophageal phase (R13.14) Attention and concentration deficit following -- Frontal lobe and executive function deficit following -- Impact on safety and function Mild aspiration risk   CHL IP TREATMENT RECOMMENDATION 04/21/2016 Treatment Recommendations Therapy as outlined in treatment plan below   Prognosis 04/21/2016 Prognosis for Safe Diet Advancement Good Barriers to Reach Goals -- Barriers/Prognosis Comment -- CHL IP DIET RECOMMENDATION 04/21/2016 SLP Diet Recommendations Dysphagia 3 (Mech soft) solids;Thin liquid Liquid Administration via Cup;Straw Medication Administration Crushed with puree Compensations Slow rate;Small sips/bites;Follow solids with liquid Postural Changes Seated upright at 90 degrees   CHL IP OTHER RECOMMENDATIONS 04/21/2016 Recommended Consults Consider GI evaluation;Consider esophageal assessment Oral Care Recommendations Oral care BID Other Recommendations --   CHL IP FOLLOW UP RECOMMENDATIONS 04/21/2016 Follow up Recommendations Skilled Nursing facility;24 hour supervision/assistance   CHL IP FREQUENCY AND DURATION  04/21/2016 Speech Therapy Frequency (ACUTE ONLY) min 1 x/week Treatment Duration 1 week      CHL IP ORAL PHASE 04/21/2016 Oral Phase Impaired Oral - Pudding Teaspoon -- Oral - Pudding Cup -- Oral - Honey Teaspoon -- Oral - Honey Cup -- Oral - Nectar Teaspoon -- Oral - Nectar Cup -- Oral - Nectar Straw -- Oral - Thin Teaspoon -- Oral - Thin Cup WFL Oral - Thin Straw WFL Oral - Puree -- Oral - Mech Soft -- Oral - Regular (No Data) Oral - Multi-Consistency -- Oral - Pill -- Oral Phase - Comment --  CHL IP PHARYNGEAL PHASE 04/21/2016 Pharyngeal Phase Impaired Pharyngeal- Pudding Teaspoon -- Pharyngeal -- Pharyngeal- Pudding Cup -- Pharyngeal -- Pharyngeal- Honey Teaspoon -- Pharyngeal -- Pharyngeal- Honey Cup -- Pharyngeal -- Pharyngeal- Nectar Teaspoon -- Pharyngeal -- Pharyngeal- Nectar Cup -- Pharyngeal --  Pharyngeal- Nectar Straw -- Pharyngeal -- Pharyngeal- Thin Teaspoon -- Pharyngeal -- Pharyngeal- Thin Cup Pharyngeal residue - valleculae;Pharyngeal residue - pyriform;Reduced laryngeal elevation;Reduced epiglottic inversion Pharyngeal -- Pharyngeal- Thin Straw Pharyngeal residue - valleculae;Pharyngeal residue - pyriform;Reduced laryngeal elevation;Reduced epiglottic inversion Pharyngeal -- Pharyngeal- Puree -- Pharyngeal -- Pharyngeal- Mechanical Soft -- Pharyngeal -- Pharyngeal- Regular Pharyngeal residue - valleculae;Reduced epiglottic inversion;Reduced laryngeal elevation Pharyngeal -- Pharyngeal- Multi-consistency -- Pharyngeal -- Pharyngeal- Pill -- Pharyngeal -- Pharyngeal Comment --  CHL IP CERVICAL ESOPHAGEAL PHASE 04/21/2016 Cervical Esophageal Phase WFL Pudding Teaspoon -- Pudding Cup -- Honey Teaspoon -- Honey Cup -- Nectar Teaspoon -- Nectar Cup -- Nectar Straw -- Thin Teaspoon -- Thin Cup -- Thin Straw -- Puree -- Mechanical Soft -- Regular -- Multi-consistency -- Pill -- Cervical Esophageal Comment -- CHL IP GO 08/08/2015 Functional Assessment Tool Used mbs Functional Limitations Swallowing Swallow Current Status (A2505) CJ Swallow Goal Status (L9767) CJ Swallow Discharge Status (H4193) CJ Motor Speech Current Status (X9024) (None) Motor Speech Goal Status (O9735) (None) Motor Speech Goal Status (H2992) (None) Spoken Language Comprehension Current Status (E2683) (None) Spoken Language Comprehension Goal Status (M1962) (None) Spoken Language Comprehension Discharge Status (I2979) (None) Spoken Language Expression Current Status (G9211) (None) Spoken Language Expression Goal Status (H4174) (None) Spoken Language Expression Discharge Status (Y8144) (None) Attention Current Status (Y1856) (None) Attention Goal Status (D1497) (None) Attention Discharge Status (W2637) (None) Memory Current Status (C5885) (None) Memory Goal Status (O2774) (None) Memory Discharge Status (J2878) (None) Voice Current Status  (M7672) (None) Voice Goal Status (C9470) (None) Voice Discharge Status (J6283) (None) Other Speech-Language Pathology Functional Limitation Current Status (M6294) (None) Other Speech-Language Pathology Functional Limitation Goal Status (T6546) (None) Other Speech-Language Pathology Functional Limitation Discharge Status [phone]) (None) [place] Siren 04/21/2016, 2:50 PM Orbie Pyo Litaker M.Ed CCC-SLP Pager [phone]                CBC  Recent Labs Lab 04/20/16 0945 04/20/16 0949 04/21/16 0614 04/22/16 0607 04/23/16 0401 04/24/16 0424  WBC 5.8  --  5.6 6.5 7.3 8.3  HGB 13.5 15.0 13.9 14.1 13.5 12.4  HCT 42.0 44.0 43.1 43.6 42.4 38.3  PLT 455*  --  435* 456* 472* 434*  MCV 111.4*  --  111.4* 111.5* 111.3* 111.0*  MCH 35.8*  --  35.9* 36.1* 35.4* 35.9*  MCHC 32.1  --  32.3 32.3 31.8 32.4  RDW 14.3  --  14.8 14.7 14.5 14.7  LYMPHSABS 1.5  --   --   --   --   --   MONOABS 0.3  --   --   --   --   --  EOSABS 0.2  --   --   --   --   --   BASOSABS 0.1  --   --   --   --   --     Chemistries   Recent Labs Lab 04/20/16 0945 04/20/16 0949 04/21/16 0614 04/21/16 1453 04/23/16 0401 04/24/16 0424  NA 140 140 139 142 142 144  K 4.6 4.3 6.1* 4.2 2.3* 3.3*  CL 107 104 107 106 104 107  CO2 27  --  22 24 28 24   GLUCOSE 84 87 74 110* 102* 98  BUN 9 10 8  5* 5* 7  CREATININE 0.42* 0.80 0.43* 0.42* 0.55 0.38*  CALCIUM 8.9  --  8.9 8.9 8.7* 8.9   ------------------------------------------------------------------------------------------------------------------ estimated creatinine clearance is 50.2 mL/min (A) (by C-G formula based on SCr of 0.38 mg/dL (L)). ------------------------------------------------------------------------------------------------------------------ No results for input(s): HGBA1C in the last 72 hours. ------------------------------------------------------------------------------------------------------------------ No results for input(s): CHOL, HDL, LDLCALC,  TRIG, CHOLHDL, LDLDIRECT in the last 72 hours. ------------------------------------------------------------------------------------------------------------------ No results for input(s): TSH, T4TOTAL, T3FREE, THYROIDAB in the last 72 hours.  Invalid input(s): FREET3 ------------------------------------------------------------------------------------------------------------------ No results for input(s): VITAMINB12, FOLATE, FERRITIN, TIBC, IRON, RETICCTPCT in the last 72 hours.  Coagulation profile No results for input(s): INR, PROTIME in the last 168 hours.  No results for input(s): DDIMER in the last 72 hours.  Cardiac Enzymes No results for input(s): CKMB, TROPONINI, MYOGLOBIN in the last 168 hours.  Invalid input(s): CK ------------------------------------------------------------------------------------------------------------------ Invalid input(s): POCBNP   CBG: No results for input(s): GLUCAP in the last 168 hours.     Studies: Dg Chest 2 View  Result Date: 04/22/2016 CLINICAL DATA:  Acute onset of cough.  Initial encounter. EXAM: CHEST  2 VIEW COMPARISON:  Chest radiograph performed 04/20/2016 FINDINGS: The lungs are well-aerated. Right perihilar opacity is new from the study performed 2 days ago, and may reflect pneumonia. There is no evidence of pleural effusion or pneumothorax. Underlying vascular congestion is noted. As before, a very large thyroid goiter displaces the trachea to the right, with tracheal narrowing. This finding is stable from 2012. The heart is mildly enlarged. No acute osseous abnormalities are seen. Degenerative change is noted at the glenohumeral joints. IMPRESSION: 1. Right perihilar opacity is new from the study performed 2 days ago, and may reflect pneumonia. Underlying vascular congestion and mild cardiomegaly. 2. Very large thyroid goiter again noted, displacing the trachea to the right, with tracheal narrowing. This is stable from 2012.  Electronically Signed   By: Garald Balding M.D.   On: 04/22/2016 19:47   Dg Esophagus  Result Date: 04/23/2016 CLINICAL DATA:  Dysphagia.  Substernal goiter. EXAM: ESOPHOGRAM/BARIUM SWALLOW TECHNIQUE: Single contrast examination was performed using  thin barium. FLUOROSCOPY TIME:  Fluoroscopy Time: 1 minutes 48 seconds ; low dose pulsed fluoroscopy Radiation Exposure Index (if provided by the fluoroscopic device): 15.6 mGy Number of Acquired Spot Images: 0 COMPARISON:  None. FINDINGS: Technically difficult exam as patient could not drink from cup or straw, and thin barium was administered orally by syringe. The patient could only take small swallows of barium. A large substernal goiter is seen causing marked displacement of the esophagus to the right. A smooth stricture of the upper esophagus is seen at level thoracic inlet, likely due to external compression by substernal goiter. This does not obstruct the passage of thin barium. No evidence of distal esophageal stricture or hiatal hernia. Lack of primary peristalsis resulting in esophageal stasis. IMPRESSION: Technically difficult exam. Large substernal goiter causes marked  displacement of esophagus to the right. Smooth stricture of the upper esophagus at level of thoracic inlet is likely due to external compression by substernal goiter. Electronically Signed   By: Earle Gell M.D.   On: 04/23/2016 14:45      Lab Results  Component Value Date   HGBA1C 5.4 03/09/2014   Lab Results  Component Value Date   LDLCALC 53 03/09/2014   CREATININE 0.38 (L) 04/24/2016       Scheduled Meds: . chlorhexidine  15 mL Mouth Rinse BID  . diclofenac sodium  2 g Topical QID  . enoxaparin (LOVENOX) injection  80 mg Subcutaneous Q12H  . ipratropium-albuterol  3 mL Nebulization Q6H  . mouth rinse  15 mL Mouth Rinse q12n4p  . metoprolol  2.5 mg Intravenous Q6H   Continuous Infusions: . piperacillin-tazobactam (ZOSYN)  IV       LOS: 4 days    Time  spent: >30 MINS    Cliff Damiani  [facility] Page via [email or web address], password [email or web address]  If 7PM-7AM, please contact night-coverage at [email or web address], password [facility] 04/24/2016, 11:47 AM  LOS: 4 days

## 2016-04-24 NOTE — Telephone Encounter (Signed)
Daughter of informed of Provider instructions. She stated the MD in the hospital has referred her back to providers office to take care of.

## 2016-04-24 NOTE — Progress Notes (Signed)
Pt found to have sats 84% on RA. RT placed on [place] after nebulizer treatment. Pt with secretions building in throat and Rhonchi throughout. RT instructed patient to cough and suctioned back of throat with [place]. Pt did not have a gag reflex, so no effective cough was produced. Would likley benefit from NTS PRN. Will cont to monitor

## 2016-04-24 NOTE — Telephone Encounter (Signed)
Needs f/u ov next week.

## 2016-04-24 NOTE — Progress Notes (Signed)
Physical Therapy Treatment Patient Details Name: Sandy Barrett MRN: [id number] DOB: Jul 01, 1932 Today's Date: 04/24/2016    History of Present Illness 83 y.o. female with medical history significant of Hyperthyroidism, HTN, migraine headache, left MCA stroke with residual right sided weakness and aphasia 2016,  PE admitted due to aspiration PNA.    PT Comments    Pt sat edge of bed x 20 min with reaching with LUE.  Pt with weakness in L shoulder and limited during reaching.  Pt will require total assist at d/c and family able to provide.  Pt appears happy sitting edge of bed and daughter pleased with progress.     Follow Up Recommendations  Supervision/Assistance - 24 hour;No PT follow up     Equipment Recommendations  None recommended by PT    Recommendations for Other Services       Precautions / Restrictions Precautions Precautions: Fall Precaution Comments: flaccid RUE Restrictions Weight Bearing Restrictions: No    Mobility  Bed Mobility Overal bed mobility: Needs Assistance Bed Mobility: Supine to Sit;Sit to Supine     Supine to sit: Max assist Sit to supine: Max assist   General bed mobility comments: Pt required cues for hand placement able to assist and follow commands for LLE/LUE, PTA assist RUE/RLE.  Ptrequired total assist to boost to head of bed with bed pads. Pt sat edge of bed x 20 min unassisted with intermittent min assist to correct posterior LOB.    Transfers                    Ambulation/Gait                 Stairs            Wheelchair Mobility    Modified Rankin (Stroke Patients Only)       Balance Overall balance assessment: Needs assistance   Sitting balance-Leahy Scale: Poor                                      Cognition Arousal/Alertness: Awake/alert Behavior During Therapy: WFL for tasks assessed/performed Overall Cognitive Status: History of cognitive impairments - at baseline                                         Exercises      General Comments        Pertinent Vitals/Pain Pain Assessment: Faces Faces Pain Scale: No hurt    Home Living                      Prior Function            PT Goals (current goals can now be found in the care plan section) Acute Rehab PT Goals Patient Stated Goal: To return home Potential to Achieve Goals: Fair Progress towards PT goals: Progressing toward goals    Frequency    Min 3X/week      PT Plan Current plan remains appropriate    Co-evaluation             End of Session   Activity Tolerance: Patient tolerated treatment well Patient left: in bed;with family/visitor present;with bed alarm set   PT Visit Diagnosis: Unsteadiness on feet (R26.81)     Time: 4403-4742 PT Time Calculation (min) (  ACUTE ONLY): 30 min  Charges:  $Therapeutic Activity: 23-37 mins                    G CodesGovernor Rooks, PTA pager [phone]    Cristela Blue 04/24/2016, 5:00 PM

## 2016-04-24 NOTE — Telephone Encounter (Signed)
Patient daughter stated patient is in the hospital she has a large thyroid obstruction, and need to know if she will need surgery or what will she need to do. Please advise

## 2016-04-24 NOTE — Telephone Encounter (Signed)
Other than if you are pregnant, the risk is as close to zero as can be mesaured

## 2016-04-24 NOTE — Telephone Encounter (Signed)
They were given 2 optns. Today in the hospital, either surgery or radioactive iodine. Her question as her caregiver is that the instructions are for her to be alone after the radio active iodine treatment and patient cannot be alone.  What are her risk (as her caretaker) if she were with her the 3 days after

## 2016-04-24 NOTE — Telephone Encounter (Signed)
I have spoken with Drs there twice this week.  They are working on it.  Please continue to work with them.

## 2016-04-25 LAB — CBC
HCT: 37.2 % (ref 36.0–46.0)
Hemoglobin: 12.1 g/dL (ref 12.0–15.0)
MCH: 36.6 pg — ABNORMAL HIGH (ref 26.0–34.0)
MCHC: 32.5 g/dL (ref 30.0–36.0)
MCV: 112.4 fL — ABNORMAL HIGH (ref 78.0–100.0)
Platelets: 423 10*3/uL — ABNORMAL HIGH (ref 150–400)
RBC: 3.31 MIL/uL — ABNORMAL LOW (ref 3.87–5.11)
RDW: 14.9 % (ref 11.5–15.5)
WBC: 7.4 10*3/uL (ref 4.0–10.5)

## 2016-04-25 LAB — BASIC METABOLIC PANEL
Anion gap: 8 (ref 5–15)
BUN: 5 mg/dL — ABNORMAL LOW (ref 6–20)
CO2: 27 mmol/L (ref 22–32)
Calcium: 8.7 mg/dL — ABNORMAL LOW (ref 8.9–10.3)
Chloride: 108 mmol/L (ref 101–111)
Creatinine, Ser: 0.34 mg/dL — ABNORMAL LOW (ref 0.44–1.00)
GFR calc Af Amer: >60 mL/min (ref >60)
GFR calc non Af Amer: >60 mL/min (ref >60)
Glucose, Bld: 82 mg/dL (ref 65–99)
Potassium: 4 mmol/L (ref 3.5–5.1)
Sodium: 143 mmol/L (ref 135–145)

## 2016-04-25 LAB — CULTURE, BLOOD (ROUTINE X 2): Culture: NO GROWTH

## 2016-04-25 NOTE — Progress Notes (Signed)
[facility] PROGRESS NOTE  Sandy Barrett EGB:[id number] DOB: 09-22-1932 DOA: 04/20/2016   PCP: [name], MD  Narrative: 83 y.o. 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 Found to have aspiration pneumonia and dysphagia  Assessment and plan  -Aspiration pneumonia -has multifactorial dysphagia -due to advanced age, Debility, stroke in 2016 and large multinodular goitre -s/p SLP evaluation, on D3 diet now -continue IV Zosyn, nebs for pulm toilet, NT suctioning PRN etc -see discussion on goitre below -I discussed the need for Palliative care discussions and limited options with dysphagia/aspiration pneumonia and limited options for multinodular goitre as discussed below -Palliative consulted for Goals of care  Multinodular goitre -Repeat CT soft tissue neck,  shows only slight interval growth of large goiter, no significant change compared to 2017 she is not a good surgical candidate she has already seen [facility] in the past and they said this, I called and d/w Dr.Sean Loanne Drilling and he felt that due to her debilitated status she may not be able to manage isolation for 5days and radioactive iodine at this time, daughter had more questions abt Radioactive iodone/risk to care giver, I have asked her to talk to [place] abt this, notes from EMR reviewed, plan for outpatient FU with Dr.Ellison next week to decide on radio-active iodine -Last TSH and freeT4 on 04/01/16 were normal on current dose of Methimazole -on CT chest  the SVC is displaced and effaced and has chronically occluded Left ICA/IJ  Hyperkalemia -s/p one dose of Kayexalate  Hypertension -Currently controlled, monitor  Large MCA stroke  with Spastic hemiparesis (right sided), dysphagia and aphasia - bed/wheel chair bound at baseline -and h/o DVT and PE in 2016. -on eliquis at home, now on lovenox   Patient is on Eliquis for PE, - currently on Lovenox  bid, resume eliquis when swallowing improved, likely tomorrow  Thrombocytosis  Followed by Dr Waldon Merl   DVT prophylaxsis Lovenox  Code Status:   Full code Family Communication: daughter at bedside Disposition Plan: to be determined, suspect home in few days if stable    Consultants: Palliative consult  Procedures:  None  Antibiotics: Anti-infectives    Start     Dose/Rate Route Frequency Ordered Stop   04/20/16 2359  piperacillin-tazobactam (ZOSYN) IVPB 3.375 g     3.375 g 12.5 mL/hr over 240 Minutes Intravenous Every 8 hours 04/20/16 2341     04/20/16 1230  piperacillin-tazobactam (ZOSYN) IVPB 3.375 g     3.375 g 100 mL/hr over 30 Minutes Intravenous  Once 04/20/16 1227 04/20/16 1713         HPI/Subjective: Breathing improving  Objective: Vitals:   04/25/16 0112 04/25/16 0215 04/25/16 0455 04/25/16 0952  BP:   (!) 143/85   Pulse:   95   Resp:   (!) 21   Temp:   98.1 F (36.7 C)   TempSrc:      SpO2: 98%  95% 98%  Weight:  75.9 kg (167 lb 4.6 oz)      Intake/Output Summary (Last 24 hours) at 04/25/16 1428 Last data filed at 04/25/16 1354  Gross per 24 hour  Intake  200 ml  Output                0 ml  Net              200 ml    Exam:  Examination:   General: chronically ill female, sitting in bed, frail, no distress Lungs:: few ronchi, conducted upper airway sounds Cardiovascular system: S1 & S2 heard, RRR. No JVD, murmurs, rubs Abd: : nondistended, soft and nontender. Normal bowel sounds heard. Central nervous system: awake, aphasic, R hemiplegia. Extremities: R hemiplegia Skin: No rashes, lesions or ulcers Psychiatry: unable to assess, pleasant    Data Reviewed: I have personally reviewed following labs and  imaging studies  Micro Results Recent Results (from the past 240 hour(s))  Culture, blood (routine x 2) Call MD if unable to obtain prior to antibiotics being given     Status: None   Collection Time: 04/20/16  7:35 PM  Result Value Ref Range Status   Specimen Description BLOOD LEFT ARM  Final   Special Requests IN PEDIATRIC BOTTLE BCAV  Final   Culture NO GROWTH 5 DAYS  Final   Report Status 04/25/2016 FINAL  Final  Culture, blood (routine x 2) Call MD if unable to obtain prior to antibiotics being given     Status: Abnormal   Collection Time: 04/20/16  7:37 PM  Result Value Ref Range Status   Specimen Description BLOOD RIGHT HAND  Final   Special Requests IN PEDIATRIC BOTTLE Blood Culture adequate volume  Final   Culture  Setup Time   Final    GRAM POSITIVE COCCI IN CLUSTERS IN PEDIATRIC BOTTLE CRITICAL RESULT CALLED TO, READ BACK BY AND VERIFIED WITH: CARON AMEND,PHARMD @0025  04/22/16 MKELLY,MLT    Culture (A)  Final    STAPHYLOCOCCUS SPECIES (COAGULASE NEGATIVE) THE SIGNIFICANCE OF ISOLATING THIS ORGANISM FROM A SINGLE SET OF BLOOD CULTURES WHEN MULTIPLE SETS ARE DRAWN IS UNCERTAIN. PLEASE NOTIFY THE MICROBIOLOGY DEPARTMENT WITHIN ONE WEEK IF SPECIATION AND SENSITIVITIES ARE REQUIRED.    Report Status 04/23/2016 FINAL  Final    Radiology Reports Dg Chest 1 View  Result Date: 04/14/2016 CLINICAL DATA:  Chest congestion, cough, dysphagia for the past 5 days. History previous CVA, pulmonary embolism. Patient unable to raise the chin above the chest. EXAM: CHEST 1 VIEW COMPARISON:  Portable chest x-ray of May 31, 2015 and chest CT scan of May 31, 2015. FINDINGS: Again demonstrated is a large soft tissue mass which deviates the trachea to the right most compatible with a goiter which is been demonstrated on previous chest x-ray and CT scan. The lungs are well-expanded. There are stable calcifications in the left upper hemithorax which lie in the soft tissues superficial to the scapula  on the previous CT scan. The cardiac silhouette remains enlarged. The pulmonary vascularity is not engorged. There is calcification in the wall of the aortic arch. There is degenerative change of both shoulders. IMPRESSION: No acute pneumonia nor pulmonary edema.  Stable mild cardiomegaly. Thoracic aortic atherosclerosis. Large goiter with deviation of the trachea toward the right with narrowing of the tracheal lumen. This has progressed somewhat since the previous study. Electronically Signed   By: David  Martinique M.D.   On: 04/14/2016 08:28   Dg Chest 2 View  Result Date: 04/22/2016 CLINICAL DATA:  Acute onset of cough.  Initial encounter. EXAM: CHEST  2 VIEW COMPARISON:  Chest radiograph performed 04/20/2016 FINDINGS: The lungs are well-aerated. Right perihilar opacity is new from the study performed  2 days ago, and may reflect pneumonia. There is no evidence of pleural effusion or pneumothorax. Underlying vascular congestion is noted. As before, a very large thyroid goiter displaces the trachea to the right, with tracheal narrowing. This finding is stable from 2012. The heart is mildly enlarged. No acute osseous abnormalities are seen. Degenerative change is noted at the glenohumeral joints. IMPRESSION: 1. Right perihilar opacity is new from the study performed 2 days ago, and may reflect pneumonia. Underlying vascular congestion and mild cardiomegaly. 2. Very large thyroid goiter again noted, displacing the trachea to the right, with tracheal narrowing. This is stable from 2012. Electronically Signed   By: Garald Balding M.D.   On: 04/22/2016 19:47   Dg Chest 2 View  Result Date: 04/20/2016 CLINICAL DATA:  Cough and shortness of breath. EXAM: CHEST  2 VIEW COMPARISON:  04/13/2016 and chest CT on 05/31/2015 FINDINGS: Heart size is stable. Large mediastinal mass resulting in tracheal narrowing and deviation to the right is stable, and consistent with large substernal goiter, as demonstrated on previous CT.  No evidence of pulmonary infiltrate or edema. Limited visualization on lateral projection due to position of patient's arms. IMPRESSION: No acute findings. Stable large substernal goiter causing tracheal narrowing and deviation to the right. Electronically Signed   By: Earle Gell M.D.   On: 04/20/2016 10:13   Ct Soft Tissue Neck W Contrast  Result Date: 04/21/2016 CLINICAL DATA:  Continued surveillance multinodular goiter. EXAM: CT NECK WITH CONTRAST TECHNIQUE: Multidetector CT imaging of the neck was performed using the standard protocol following the bolus administration of intravenous contrast. CONTRAST:  51mL ISOVUE-300 IOPAMIDOL (ISOVUE-300) INJECTION 61% COMPARISON:  07/01/2015. FINDINGS: Pharynx and larynx: Normal pharyngeal structures. Severe rightward displacement of the proximal trachea and larynx due to the leftward dominant multinodular goiter. This may be increased from prior of June 2017. Salivary glands: No inflammation, mass, or stone. Thyroid: Markedly enlarged and heterogeneous thyroid, with multiple nodules and scattered calcifications, significant substernal extent. In addition to mass effect on the trachea, the SVC is displaced and effaced. Approximate measurements are 97 x 45 x 68 mm, although the entire craniocaudal extent is not assessed on this CT neck study. Lymph nodes: None enlarged or abnormal density. Vascular: Occluded LEFT ICA. Calcifications in the LEFT internal jugular are redemonstrated consistent with chronic thrombosis. Limited intracranial: LEFT temporal lobe encephalomalacia better visualized on previous exam. Visualized orbits: Not visualized. Mastoids and visualized paranasal sinuses: Clear. Skeleton: No acute osseous findings. Cervical spondylosis. Nuchal ligament ossification. Upper chest: No pulmonary nodule or pneumothorax. Other: None. IMPRESSION: Slight interval growth since 2017 of large goiter. Significant LEFT-to-RIGHT mass effect on the trachea and SVC; see  discussion above. Electronically Signed   By: Staci Righter M.D.   On: 04/21/2016 13:19   Dg Esophagus  Result Date: 04/23/2016 CLINICAL DATA:  Dysphagia.  Substernal goiter. EXAM: ESOPHOGRAM/BARIUM SWALLOW TECHNIQUE: Single contrast examination was performed using  thin barium. FLUOROSCOPY TIME:  Fluoroscopy Time: 1 minutes 48 seconds ; low dose pulsed fluoroscopy Radiation Exposure Index (if provided by the fluoroscopic device): 15.6 mGy Number of Acquired Spot Images: 0 COMPARISON:  None. FINDINGS: Technically difficult exam as patient could not drink from cup or straw, and thin barium was administered orally by syringe. The patient could only take small swallows of barium. A large substernal goiter is seen causing marked displacement of the esophagus to the right. A smooth stricture of the upper esophagus is seen at level thoracic inlet, likely due to external compression  by substernal goiter. This does not obstruct the passage of thin barium. No evidence of distal esophageal stricture or hiatal hernia. Lack of primary peristalsis resulting in esophageal stasis. IMPRESSION: Technically difficult exam. Large substernal goiter causes marked displacement of esophagus to the right. Smooth stricture of the upper esophagus at level of thoracic inlet is likely due to external compression by substernal goiter. Electronically Signed   By: Earle Gell M.D.   On: 04/23/2016 14:45   Dg Swallowing Func-speech Pathology  Result Date: 04/21/2016 Objective Swallowing Evaluation: Type of Study: MBS-Modified Barium Swallow Study Patient Details Name: Nikie Cid MRN: [id number] Date of Birth: 08/12/1932 Today's Date: 04/21/2016 Time: SLP Start Time (ACUTE ONLY): 1039-SLP Stop Time (ACUTE ONLY): 1053 SLP Time Calculation (min) (ACUTE ONLY): 14 min Past Medical History: Past Medical History: Diagnosis Date . ARTHRITIS  . Arthritis  . Diverticulosis  . HYPERTENSION  . HYPERTHYROIDISM  . Hyperthyroidism   s/p I-131 ablation  03/2011 of multinod goiter . INSOMNIA  . MIGRAINE HEADACHE  . OBESITY  . Posttraumatic stress disorder  . Pulmonary embolism ([place]) 03/2014  with DVT . Stroke [facility]) 03/2014  dysarthria Past Surgical History: Past Surgical History: Procedure Laterality Date . ABDOMINAL HYSTERECTOMY  1976 . BREAST SURGERY    biopsy . RADIOLOGY WITH ANESTHESIA Left 03/08/2014  Procedure: RADIOLOGY WITH ANESTHESIA;  Surgeon: Rob Hickman, MD;  Location: [facility];  Service: Radiology;  Laterality: Left; . TONSILLECTOMY   . TOTAL HIP ARTHROPLASTY  1998   right HPI: Ptis a 83 y.o.femalewith medical history significant of hyperthyroidism, HTN, migraine headache, left MCA stroke with residual right sided weakness and aphasia, and PE who presented to the ED with c/o cough and wheezing. Patient seen by PCP on 04/13/2016 and diagnosed with CAP and UTI. CXR showed no acute findings, stable large substernal goiter causing tracheal narrowing and deviation to the right. No Data Recorded Assessment / Plan / Recommendation CHL IP CLINICAL IMPRESSIONS 04/21/2016 Clinical Impression Ms. Capley presents with a mild oral/pharyngeal dysphagia, suspect primary esophageal dysphagia. Oral phase impairments included mild prolonged mastication with regular solids (pt wears partial dentures). Observed mild vallecular and pyriform sinus residue with thin liquids via cup/straw due to reduced laryngeal elevation and epiglottic inversion. MBS does not diagnose below the level of the UES however scan of esophagus revealed stasis within the mid-esophagus and questionable stenosis. Pt previously diagnosed with a substernal goiter causing tracheal narrowing and deviation to the right possible causing observations. Educated pt re: results of MBS, diet recommendations, esophageal precautions (alternate solids/liquids, sit upright after meals), and advised pt to consider GI consult due to esophageal findings. Recommend Dys 3 solids, thin liquids (straws ok), meds crushed.  ST will f/u briefly to assess diet tolerance and for continued education. SLP Visit Diagnosis Dysphagia, pharyngoesophageal phase (R13.14) Attention and concentration deficit following -- Frontal lobe and executive function deficit following -- Impact on safety and function Mild aspiration risk   CHL IP TREATMENT RECOMMENDATION 04/21/2016 Treatment Recommendations Therapy as outlined in treatment plan below   Prognosis 04/21/2016 Prognosis for Safe Diet Advancement Good Barriers to Reach Goals -- Barriers/Prognosis Comment -- CHL IP DIET RECOMMENDATION 04/21/2016 SLP Diet Recommendations Dysphagia 3 (Mech soft) solids;Thin liquid Liquid Administration via Cup;Straw Medication Administration Crushed with puree Compensations Slow rate;Small sips/bites;Follow solids with liquid Postural Changes Seated upright at 90 degrees   CHL IP OTHER RECOMMENDATIONS 04/21/2016 Recommended Consults Consider GI evaluation;Consider esophageal assessment Oral Care Recommendations Oral care BID Other Recommendations --   CHL IP FOLLOW  UP RECOMMENDATIONS 04/21/2016 Follow up Recommendations Skilled Nursing facility;24 hour supervision/assistance   CHL IP FREQUENCY AND DURATION 04/21/2016 Speech Therapy Frequency (ACUTE ONLY) min 1 x/week Treatment Duration 1 week      CHL IP ORAL PHASE 04/21/2016 Oral Phase Impaired Oral - Pudding Teaspoon -- Oral - Pudding Cup -- Oral - Honey Teaspoon -- Oral - Honey Cup -- Oral - Nectar Teaspoon -- Oral - Nectar Cup -- Oral - Nectar Straw -- Oral - Thin Teaspoon -- Oral - Thin Cup WFL Oral - Thin Straw WFL Oral - Puree -- Oral - Mech Soft -- Oral - Regular (No Data) Oral - Multi-Consistency -- Oral - Pill -- Oral Phase - Comment --  CHL IP PHARYNGEAL PHASE 04/21/2016 Pharyngeal Phase Impaired Pharyngeal- Pudding Teaspoon -- Pharyngeal -- Pharyngeal- Pudding Cup -- Pharyngeal -- Pharyngeal- Honey Teaspoon -- Pharyngeal -- Pharyngeal- Honey Cup -- Pharyngeal -- Pharyngeal- Nectar Teaspoon -- Pharyngeal --  Pharyngeal- Nectar Cup -- Pharyngeal -- Pharyngeal- Nectar Straw -- Pharyngeal -- Pharyngeal- Thin Teaspoon -- Pharyngeal -- Pharyngeal- Thin Cup Pharyngeal residue - valleculae;Pharyngeal residue - pyriform;Reduced laryngeal elevation;Reduced epiglottic inversion Pharyngeal -- Pharyngeal- Thin Straw Pharyngeal residue - valleculae;Pharyngeal residue - pyriform;Reduced laryngeal elevation;Reduced epiglottic inversion Pharyngeal -- Pharyngeal- Puree -- Pharyngeal -- Pharyngeal- Mechanical Soft -- Pharyngeal -- Pharyngeal- Regular Pharyngeal residue - valleculae;Reduced epiglottic inversion;Reduced laryngeal elevation Pharyngeal -- Pharyngeal- Multi-consistency -- Pharyngeal -- Pharyngeal- Pill -- Pharyngeal -- Pharyngeal Comment --  CHL IP CERVICAL ESOPHAGEAL PHASE 04/21/2016 Cervical Esophageal Phase WFL Pudding Teaspoon -- Pudding Cup -- Honey Teaspoon -- Honey Cup -- Nectar Teaspoon -- Nectar Cup -- Nectar Straw -- Thin Teaspoon -- Thin Cup -- Thin Straw -- Puree -- Mechanical Soft -- Regular -- Multi-consistency -- Pill -- Cervical Esophageal Comment -- CHL IP GO 08/08/2015 Functional Assessment Tool Used mbs Functional Limitations Swallowing Swallow Current Status (D5329) CJ Swallow Goal Status (J2426) CJ Swallow Discharge Status (S3419) CJ Motor Speech Current Status (Q2229) (None) Motor Speech Goal Status (N9892) (None) Motor Speech Goal Status (J1941) (None) Spoken Language Comprehension Current Status (D4081) (None) Spoken Language Comprehension Goal Status (K4818) (None) Spoken Language Comprehension Discharge Status (H6314) (None) Spoken Language Expression Current Status (H7026) (None) Spoken Language Expression Goal Status (V7858) (None) Spoken Language Expression Discharge Status (I5027) (None) Attention Current Status (X4128) (None) Attention Goal Status (N8676) (None) Attention Discharge Status (H2094) (None) Memory Current Status (B0962) (None) Memory Goal Status (E3662) (None) Memory Discharge Status  (H4765) (None) Voice Current Status (Y6503) (None) Voice Goal Status (T4656) (None) Voice Discharge Status (C1275) (None) Other Speech-Language Pathology Functional Limitation Current Status (T7001) (None) Other Speech-Language Pathology Functional Limitation Goal Status (V4944) (None) Other Speech-Language Pathology Functional Limitation Discharge Status [phone]) (None) [place] Siren 04/21/2016, 2:50 PM Orbie Pyo Litaker M.Ed CCC-SLP Pager [phone]                CBC  Recent Labs Lab 04/20/16 0945  04/21/16 0614 04/22/16 0607 04/23/16 0401 04/24/16 0424 04/25/16 0521  WBC 5.8  --  5.6 6.5 7.3 8.3 7.4  HGB 13.5  < > 13.9 14.1 13.5 12.4 12.1  HCT 42.0  < > 43.1 43.6 42.4 38.3 37.2  PLT 455*  --  435* 456* 472* 434* 423*  MCV 111.4*  --  111.4* 111.5* 111.3* 111.0* 112.4*  MCH 35.8*  --  35.9* 36.1* 35.4* 35.9* 36.6*  MCHC 32.1  --  32.3 32.3 31.8 32.4 32.5  RDW 14.3  --  14.8 14.7 14.5 14.7 14.9  LYMPHSABS 1.5  --   --   --   --   --   --  MONOABS 0.3  --   --   --   --   --   --   EOSABS 0.2  --   --   --   --   --   --   BASOSABS 0.1  --   --   --   --   --   --   < > = values in this interval not displayed.  Chemistries   Recent Labs Lab 04/21/16 0614 04/21/16 1453 04/23/16 0401 04/24/16 0424 04/25/16 0521  NA 139 142 142 144 143  K 6.1* 4.2 2.3* 3.3* 4.0  CL 107 106 104 107 108  CO2 22 24 28 24 27   GLUCOSE 74 110* 102* 98 82  BUN 8 5* 5* 7 5*  CREATININE 0.43* 0.42* 0.55 0.38* 0.34*  CALCIUM 8.9 8.9 8.7* 8.9 8.7*   ------------------------------------------------------------------------------------------------------------------ estimated creatinine clearance is 50.2 mL/min (A) (by C-G formula based on SCr of 0.34 mg/dL (L)). ------------------------------------------------------------------------------------------------------------------ No results for input(s): HGBA1C in the last 72  hours. ------------------------------------------------------------------------------------------------------------------ No results for input(s): CHOL, HDL, LDLCALC, TRIG, CHOLHDL, LDLDIRECT in the last 72 hours. ------------------------------------------------------------------------------------------------------------------ No results for input(s): TSH, T4TOTAL, T3FREE, THYROIDAB in the last 72 hours.  Invalid input(s): FREET3 ------------------------------------------------------------------------------------------------------------------ No results for input(s): VITAMINB12, FOLATE, FERRITIN, TIBC, IRON, RETICCTPCT in the last 72 hours.  Coagulation profile No results for input(s): INR, PROTIME in the last 168 hours.  No results for input(s): DDIMER in the last 72 hours.  Cardiac Enzymes No results for input(s): CKMB, TROPONINI, MYOGLOBIN in the last 168 hours.  Invalid input(s): CK ------------------------------------------------------------------------------------------------------------------ Invalid input(s): POCBNP   CBG: No results for input(s): GLUCAP in the last 168 hours.     Studies: No results found.    Lab Results  Component Value Date   HGBA1C 5.4 03/09/2014   Lab Results  Component Value Date   LDLCALC 53 03/09/2014   CREATININE 0.34 (L) 04/25/2016       Scheduled Meds: . chlorhexidine  15 mL Mouth Rinse BID  . diclofenac sodium  2 g Topical QID  . enoxaparin (LOVENOX) injection  80 mg Subcutaneous Q12H  . guaiFENesin  600 mg Oral BID  . ipratropium-albuterol  3 mL Nebulization Q6H  . mouth rinse  15 mL Mouth Rinse q12n4p  . metoprolol  2.5 mg Intravenous Q6H   Continuous Infusions: . piperacillin-tazobactam (ZOSYN)  IV       LOS: 5 days    Time spent: >35 MINS    Ronin Rehfeldt  [facility] Page via [email or web address], password [email or web address]  If 7PM-7AM, please contact night-coverage at [email or web address], password [email or web address] 04/25/2016, 2:28 PM  LOS:  5 days

## 2016-04-25 NOTE — Progress Notes (Signed)
Occupational Therapy Treatment Patient Details Name: Sandy Barrett MRN: [id number] DOB: 03-Aug-1932 Today's Date: 04/25/2016    History of present illness 83 y.o. female with medical history significant of Hyperthyroidism, HTN, migraine headache, left MCA stroke with residual right sided weakness and aphasia 2016,  PE admitted due to aspiration PNA.   OT comments  Pt. Seen with dtrs. Present to address current OT goals.  Reviewed self feeding (pt. Was finishing b.fast with dtrs. Help).  Also demo and return demo for proper positioning of RUE along with ROM to aide in edema reduction.  Will continue to follow acutely.    Follow Up Recommendations  No OT follow up    Equipment Recommendations  None recommended by OT    Recommendations for Other Services      Precautions / Restrictions Precautions Precautions: Fall Precaution Comments: flaccid RUE       Mobility Bed Mobility                  Transfers                      Balance                                           ADL either performed or assessed with clinical judgement   ADL Overall ADL's : Needs assistance/impaired Eating/Feeding: Moderate assistance;Bed level;Sitting Eating/Feeding Details (indicate cue type and reason): HOB elevated.  pt. able to hold utensil to self feed but dtr. was present and states she places desired portion size on the utensil and then allows pt. to bring to mouth.  explains pt. was able to manage it all but recently will continue to load food items into her mouth without stopping to chew or pace out frequency of bites.  states "i used to be able to just supervise but not anymore"  reviewed choking risks, and continued education on aspriation risks in conjunction with pneumonia which dtr. was aware of.  Demonstrated and educated on checking for food pocketing and proper cleaning and care of max. RPD, as it can house bacteria also.                                            Vision       Perception     Praxis      Cognition Arousal/Alertness: Awake/alert Behavior During Therapy: WFL for tasks assessed/performed Overall Cognitive Status: History of cognitive impairments - at baseline                                          Exercises Other Exercises Other Exercises: reviewed positioning and elevation of RUE for edema management. dtr. that was present is main caregiver and was able to show how she layers pillows,blankets, and towels for elevation.   Other Exercises: reviewed rom of digits, wrist, elbow for edema management and HEP.  dtr. also stated she was aware of these exercises and completes them "100 times a day"   Shoulder Instructions       General Comments  continued from nursing communication section.  As stated above, pts. dtr. With generalized complaints of transfers, positioning,  timing of when care is received and how it is received.  Listened to all questions and concerns.  As stated above I directed the nursing concerns to her nurse and also provided info. (posted in room on wall) for the unit directors and stated she is welcome to meet with them during the week also to have her needs met.  She also had therapy questions for myself including frequency.  She wants her mother seen everyday.  I reviewed her mother was on a medical unit not a therapy unit so she is not guaranteed therapy everyday.  I reviewed frequency set but encouraged her  To utilize nursing staff also as they can hoyer her mother to chair as that is not necessarily a therapeutic intervention.  That led to her concerns with thigh bruises from the hoyer lift.  I reviewed skin texture, certain meds can contribute to bruising.  Suggested blankets, towels to provide a barrier during hoyer use.  She states "our hoyer at home is different and doesn't make her bruise, and these towels are too rough Im not using those".  Offered her to try and  recommend sheet or blanket than as they may be softer.  Asked if she had any other questions regarding therapy or any tx. I had provided. She states she has no complaints and thanked me following up and answering all of her questions.  Met with rn again at end of this encounter to review what pt.s dtr had said and her concerns.      Pertinent Vitals/ Pain       Pain Assessment: No/denies pain (did not indicate pain)  Home Living                                          Prior Functioning/Environment              Frequency  Min 2X/week        Progress Toward Goals  OT Goals(current goals can now be found in the care plan section)  Progress towards OT goals: Progressing toward goals     Plan Discharge plan remains appropriate    Co-evaluation                 End of Session    OT Visit Diagnosis: Pain;Hemiplegia and hemiparesis Hemiplegia - Right/Left: Right Hemiplegia - dominant/non-dominant: Dominant Hemiplegia - caused by: Cerebral infarction   Activity Tolerance Patient tolerated treatment well   Patient Left in bed;with call bell/phone within reach;with family/visitor present   Nurse Communication Other (comment) (pts. dtr. with multiple complaints/concerns. 1.  board needs to be updated to reflect pts. activity level. 2. up for meals and on time.  3. proper positioning and repositioning in bed. see above general comments for cont. information not enough space)        Time: 4239-5320 OT Time Calculation (min): 31 min  Charges: OT General Charges $OT Visit: 1 Procedure OT Treatments $Self Care/Home Management : 23-37 mins   Janice Coffin, COTA/L 04/25/2016, 11:54 AM

## 2016-04-26 ENCOUNTER — Other Ambulatory Visit: Payer: Self-pay | Admitting: Internal Medicine

## 2016-04-26 DIAGNOSIS — E079 Disorder of thyroid, unspecified: Secondary | ICD-10-CM

## 2016-04-26 DIAGNOSIS — Z515 Encounter for palliative care: Secondary | ICD-10-CM

## 2016-04-26 LAB — CBC
HCT: 33.3 % — ABNORMAL LOW (ref 36.0–46.0)
Hemoglobin: 10.6 g/dL — ABNORMAL LOW (ref 12.0–15.0)
MCH: 35.6 pg — ABNORMAL HIGH (ref 26.0–34.0)
MCHC: 31.8 g/dL (ref 30.0–36.0)
MCV: 111.7 fL — ABNORMAL HIGH (ref 78.0–100.0)
Platelets: 419 10*3/uL — ABNORMAL HIGH (ref 150–400)
RBC: 2.98 MIL/uL — ABNORMAL LOW (ref 3.87–5.11)
RDW: 14.4 % (ref 11.5–15.5)
WBC: 8.5 10*3/uL (ref 4.0–10.5)

## 2016-04-26 MED ORDER — HYDROXYUREA 500 MG PO CAPS
500.0000 mg | ORAL_CAPSULE | Freq: Every day | ORAL | Status: DC
  Administered 2016-04-26 – 2016-04-28 (×3): 500 mg via ORAL
  Filled 2016-04-26 (×3): qty 1

## 2016-04-26 MED ORDER — SENNOSIDES 8.8 MG/5ML PO SYRP
5.0000 mL | ORAL_SOLUTION | Freq: Every day | ORAL | Status: DC | PRN
  Administered 2016-04-26: 5 mL via ORAL
  Filled 2016-04-26 (×2): qty 5

## 2016-04-26 MED ORDER — HYDROXYUREA 500 MG PO CAPS: 500.0000 mg | ORAL_CAPSULE | Freq: Every day | ORAL | Status: DC

## 2016-04-26 MED ORDER — IPRATROPIUM-ALBUTEROL 0.5-2.5 (3) MG/3ML IN SOLN
3.0000 mL | Freq: Three times a day (TID) | RESPIRATORY_TRACT | Status: DC
  Administered 2016-04-27 – 2016-04-29 (×8): 3 mL via RESPIRATORY_TRACT
  Filled 2016-04-26 (×8): qty 3

## 2016-04-26 NOTE — Progress Notes (Signed)
[facility] PROGRESS NOTE  Sandy Barrett JKD:[id number] DOB: [birth year] DOA: 04/20/2016   PCP: [name], MD  Narrative: 83 y.o. 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 Found to have aspiration pneumonia and dysphagia  Assessment and plan  -Aspiration pneumonia -has multifactorial dysphagia -due to advanced age, Debility, large MCA stroke in 2016 and large multinodular goitre -s/p SLP evaluation, on D3 diet now -continue IV Zosyn day 6, nebs for pulm toilet, NT suctioning PRN etc -see discussion on goitre below -I discussed the need for Palliative care discussions and limited options with dysphagia/aspiration pneumonia and limited options for multinodular goitre as discussed below -Palliative consulted for Goals of care, pts daughters have unrealistic expectations, meeting pending  Multinodular goitre -Repeat CT soft tissue neck,  shows only slight interval growth of large goiter, no significant change compared to 2017 she is not a good surgical candidate she has already seen [facility] Dr.Gerkin in 2017, I called and d/w Dr.Sean Loanne Drilling and he felt that due to her debilitated status she may not be able to manage isolation for 5days and radioactive iodine at this time, daughter had more questions abt Radioactive iodone/risk to care giver, I have asked her to talk to [place] abt this, notes from EMR reviewed, plan for outpatient FU with Dr.Ellison next week to decide on radio-active iodine option -Last TSH and freeT4 on 04/01/16 were normal on current dose of Methimazole -on CT chest  the SVC is displaced and effaced and has chronically occluded Left ICA/IJ -family requesting second surgical opinion  for thyroidectomy despite being told numerous times that she is a very poor surgical candidate given her tenuous resp status, due to persistent family request asked [facility] for input  Hyperkalemia -s/p one dose of Kayexalate  Hypertension -Currently controlled, monitor  Large MCA stroke with Spastic hemiparesis (right sided), dysphagia and aphasia - bed/wheel chair bound at baseline -and h/o DVT and PE in 2016. -on eliquis at home, now on lovenox , restart home eliquis  Patient is on Eliquis for PE, - currently on Lovenox  bid, resume eliquis today  Thrombocytosis  Followed by Dr Waldon Merl   DVT prophylaxsis Lovenox  Code Status:   Full code Family Communication: daughter at bedside Disposition Plan: Home/palliative meeting pending    Consultants: Palliative consult  Procedures:  None  Antibiotics: Anti-infectives    Start     Dose/Rate Route Frequency Ordered Stop   04/20/16 2359  piperacillin-tazobactam (ZOSYN) IVPB 3.375 g     3.375 g 12.5 mL/hr over 240 Minutes Intravenous Every 8 hours 04/20/16 2341     04/20/16 1230  piperacillin-tazobactam (ZOSYN) IVPB 3.375 g     3.375 g 100 mL/hr over 30 Minutes Intravenous  Once 04/20/16 1227 04/20/16 1713         HPI/Subjective: Breathing improving, some gurgling after meals  Objective: Vitals:   04/26/16 0139 04/26/16 0500 04/26/16 0627 04/26/16 0807  BP:   114/75   Pulse:   (!) 101   Resp:   20   Temp:   98.3 F (36.8 C)   TempSrc:   Oral   SpO2: 96%  100% 100%  Weight:  75.9 kg (167 lb 5.3 oz)  Intake/Output Summary (Last 24 hours) at 04/26/16 1017 Last data filed at 04/25/16 1354  Gross per 24 hour  Intake               50 ml  Output                0 ml  Net               50 ml    Exam:  Examination:   General: chronically ill female, frail, aphasic Lungs:: few ronchi, conducted upper airway sounds, scant r base ronchi Cardiovascular system: S1 & S2 heard, RRR. No JVD, murmurs,  rubs Abd: : nondistended, soft and nontender. Normal bowel sounds heard. Central nervous system: awake, aphasic, R hemiplegia, unchanged Extremities: R hemiplegia Skin: No rashes, lesions or ulcers Psychiatry: unable to assess, pleasant    Data Reviewed: I have personally reviewed following labs and imaging studies  Micro Results Recent Results (from the past 240 hour(s))  Culture, blood (routine x 2) Call MD if unable to obtain prior to antibiotics being given     Status: None   Collection Time: 04/20/16  7:35 PM  Result Value Ref Range Status   Specimen Description BLOOD LEFT ARM  Final   Special Requests IN PEDIATRIC BOTTLE BCAV  Final   Culture NO GROWTH 5 DAYS  Final   Report Status 04/25/2016 FINAL  Final  Culture, blood (routine x 2) Call MD if unable to obtain prior to antibiotics being given     Status: Abnormal   Collection Time: 04/20/16  7:37 PM  Result Value Ref Range Status   Specimen Description BLOOD RIGHT HAND  Final   Special Requests IN PEDIATRIC BOTTLE Blood Culture adequate volume  Final   Culture  Setup Time   Final    GRAM POSITIVE COCCI IN CLUSTERS IN PEDIATRIC BOTTLE CRITICAL RESULT CALLED TO, READ BACK BY AND VERIFIED WITH: CARON AMEND,PHARMD @0025  04/22/16 MKELLY,MLT    Culture (A)  Final    STAPHYLOCOCCUS SPECIES (COAGULASE NEGATIVE) THE SIGNIFICANCE OF ISOLATING THIS ORGANISM FROM A SINGLE SET OF BLOOD CULTURES WHEN MULTIPLE SETS ARE DRAWN IS UNCERTAIN. PLEASE NOTIFY THE MICROBIOLOGY DEPARTMENT WITHIN ONE WEEK IF SPECIATION AND SENSITIVITIES ARE REQUIRED.    Report Status 04/23/2016 FINAL  Final    Radiology Reports Dg Chest 1 View  Result Date: 04/14/2016 CLINICAL DATA:  Chest congestion, cough, dysphagia for the past 5 days. History previous CVA, pulmonary embolism. Patient unable to raise the chin above the chest. EXAM: CHEST 1 VIEW COMPARISON:  Portable chest x-ray of May 31, 2015 and chest CT scan of May 31, 2015. FINDINGS: Again demonstrated  is a large soft tissue mass which deviates the trachea to the right most compatible with a goiter which is been demonstrated on previous chest x-ray and CT scan. The lungs are well-expanded. There are stable calcifications in the left upper hemithorax which lie in the soft tissues superficial to the scapula on the previous CT scan. The cardiac silhouette remains enlarged. The pulmonary vascularity is not engorged. There is calcification in the wall of the aortic arch. There is degenerative change of both shoulders. IMPRESSION: No acute pneumonia nor pulmonary edema.  Stable mild cardiomegaly. Thoracic aortic atherosclerosis. Large goiter with deviation of the trachea toward the right with narrowing of the tracheal lumen. This has progressed somewhat since the previous study. Electronically Signed   By: David  Martinique M.D.   On: 04/14/2016 08:28   Dg Chest 2 View  Result  Date: 04/22/2016 CLINICAL DATA:  Acute onset of cough.  Initial encounter. EXAM: CHEST  2 VIEW COMPARISON:  Chest radiograph performed 04/20/2016 FINDINGS: The lungs are well-aerated. Right perihilar opacity is new from the study performed 2 days ago, and may reflect pneumonia. There is no evidence of pleural effusion or pneumothorax. Underlying vascular congestion is noted. As before, a very large thyroid goiter displaces the trachea to the right, with tracheal narrowing. This finding is stable from 2012. The heart is mildly enlarged. No acute osseous abnormalities are seen. Degenerative change is noted at the glenohumeral joints. IMPRESSION: 1. Right perihilar opacity is new from the study performed 2 days ago, and may reflect pneumonia. Underlying vascular congestion and mild cardiomegaly. 2. Very large thyroid goiter again noted, displacing the trachea to the right, with tracheal narrowing. This is stable from 2012. Electronically Signed   By: Garald Balding M.D.   On: 04/22/2016 19:47   Dg Chest 2 View  Result Date: 04/20/2016 CLINICAL  DATA:  Cough and shortness of breath. EXAM: CHEST  2 VIEW COMPARISON:  04/13/2016 and chest CT on 05/31/2015 FINDINGS: Heart size is stable. Large mediastinal mass resulting in tracheal narrowing and deviation to the right is stable, and consistent with large substernal goiter, as demonstrated on previous CT. No evidence of pulmonary infiltrate or edema. Limited visualization on lateral projection due to position of patient's arms. IMPRESSION: No acute findings. Stable large substernal goiter causing tracheal narrowing and deviation to the right. Electronically Signed   By: Earle Gell M.D.   On: 04/20/2016 10:13   Ct Soft Tissue Neck W Contrast  Result Date: 04/21/2016 CLINICAL DATA:  Continued surveillance multinodular goiter. EXAM: CT NECK WITH CONTRAST TECHNIQUE: Multidetector CT imaging of the neck was performed using the standard protocol following the bolus administration of intravenous contrast. CONTRAST:  11mL ISOVUE-300 IOPAMIDOL (ISOVUE-300) INJECTION 61% COMPARISON:  07/01/2015. FINDINGS: Pharynx and larynx: Normal pharyngeal structures. Severe rightward displacement of the proximal trachea and larynx due to the leftward dominant multinodular goiter. This may be increased from prior of June 2017. Salivary glands: No inflammation, mass, or stone. Thyroid: Markedly enlarged and heterogeneous thyroid, with multiple nodules and scattered calcifications, significant substernal extent. In addition to mass effect on the trachea, the SVC is displaced and effaced. Approximate measurements are 97 x 45 x 68 mm, although the entire craniocaudal extent is not assessed on this CT neck study. Lymph nodes: None enlarged or abnormal density. Vascular: Occluded LEFT ICA. Calcifications in the LEFT internal jugular are redemonstrated consistent with chronic thrombosis. Limited intracranial: LEFT temporal lobe encephalomalacia better visualized on previous exam. Visualized orbits: Not visualized. Mastoids and visualized  paranasal sinuses: Clear. Skeleton: No acute osseous findings. Cervical spondylosis. Nuchal ligament ossification. Upper chest: No pulmonary nodule or pneumothorax. Other: None. IMPRESSION: Slight interval growth since 2017 of large goiter. Significant LEFT-to-RIGHT mass effect on the trachea and SVC; see discussion above. Electronically Signed   By: Staci Righter M.D.   On: 04/21/2016 13:19   Dg Esophagus  Result Date: 04/23/2016 CLINICAL DATA:  Dysphagia.  Substernal goiter. EXAM: ESOPHOGRAM/BARIUM SWALLOW TECHNIQUE: Single contrast examination was performed using  thin barium. FLUOROSCOPY TIME:  Fluoroscopy Time: 1 minutes 48 seconds ; low dose pulsed fluoroscopy Radiation Exposure Index (if provided by the fluoroscopic device): 15.6 mGy Number of Acquired Spot Images: 0 COMPARISON:  None. FINDINGS: Technically difficult exam as patient could not drink from cup or straw, and thin barium was administered orally by syringe. The patient could only take  small swallows of barium. A large substernal goiter is seen causing marked displacement of the esophagus to the right. A smooth stricture of the upper esophagus is seen at level thoracic inlet, likely due to external compression by substernal goiter. This does not obstruct the passage of thin barium. No evidence of distal esophageal stricture or hiatal hernia. Lack of primary peristalsis resulting in esophageal stasis. IMPRESSION: Technically difficult exam. Large substernal goiter causes marked displacement of esophagus to the right. Smooth stricture of the upper esophagus at level of thoracic inlet is likely due to external compression by substernal goiter. Electronically Signed   By: Earle Gell M.D.   On: 04/23/2016 14:45   Dg Swallowing Func-speech Pathology  Result Date: 04/21/2016 Objective Swallowing Evaluation: Type of Study: MBS-Modified Barium Swallow Study Patient Details Name: Veronika Heard MRN: [id number] Date of Birth: 06-12-1932 Today's Date:  04/21/2016 Time: SLP Start Time (ACUTE ONLY): 1039-SLP Stop Time (ACUTE ONLY): 1053 SLP Time Calculation (min) (ACUTE ONLY): 14 min Past Medical History: Past Medical History: Diagnosis Date . ARTHRITIS  . Arthritis  . Diverticulosis  . HYPERTENSION  . HYPERTHYROIDISM  . Hyperthyroidism   s/p I-131 ablation 03/2011 of multinod goiter . INSOMNIA  . MIGRAINE HEADACHE  . OBESITY  . Posttraumatic stress disorder  . Pulmonary embolism ([place]) 03/2014  with DVT . Stroke [facility]) 03/2014  dysarthria Past Surgical History: Past Surgical History: Procedure Laterality Date . ABDOMINAL HYSTERECTOMY  1976 . BREAST SURGERY    biopsy . RADIOLOGY WITH ANESTHESIA Left 03/08/2014  Procedure: RADIOLOGY WITH ANESTHESIA;  Surgeon: Rob Hickman, MD;  Location: [name];  Service: Radiology;  Laterality: Left; . TONSILLECTOMY   . TOTAL HIP ARTHROPLASTY  1998   right HPI: Ptis a 83 y.o.femalewith medical history significant of hyperthyroidism, HTN, migraine headache, left MCA stroke with residual right sided weakness and aphasia, and PE who presented to the ED with c/o cough and wheezing. Patient seen by PCP on 04/13/2016 and diagnosed with CAP and UTI. CXR showed no acute findings, stable large substernal goiter causing tracheal narrowing and deviation to the right. No Data Recorded Assessment / Plan / Recommendation CHL IP CLINICAL IMPRESSIONS 04/21/2016 Clinical Impression Ms. Adamec presents with a mild oral/pharyngeal dysphagia, suspect primary esophageal dysphagia. Oral phase impairments included mild prolonged mastication with regular solids (pt wears partial dentures). Observed mild vallecular and pyriform sinus residue with thin liquids via cup/straw due to reduced laryngeal elevation and epiglottic inversion. MBS does not diagnose below the level of the UES however scan of esophagus revealed stasis within the mid-esophagus and questionable stenosis. Pt previously diagnosed with a substernal goiter causing tracheal narrowing and  deviation to the right possible causing observations. Educated pt re: results of MBS, diet recommendations, esophageal precautions (alternate solids/liquids, sit upright after meals), and advised pt to consider GI consult due to esophageal findings. Recommend Dys 3 solids, thin liquids (straws ok), meds crushed. ST will f/u briefly to assess diet tolerance and for continued education. SLP Visit Diagnosis Dysphagia, pharyngoesophageal phase (R13.14) Attention and concentration deficit following -- Frontal lobe and executive function deficit following -- Impact on safety and function Mild aspiration risk   CHL IP TREATMENT RECOMMENDATION 04/21/2016 Treatment Recommendations Therapy as outlined in treatment plan below   Prognosis 04/21/2016 Prognosis for Safe Diet Advancement Good Barriers to Reach Goals -- Barriers/Prognosis Comment -- CHL IP DIET RECOMMENDATION 04/21/2016 SLP Diet Recommendations Dysphagia 3 (Mech soft) solids;Thin liquid Liquid Administration via Cup;Straw Medication Administration Crushed with puree Compensations Slow rate;Small sips/bites;Follow solids  with liquid Postural Changes Seated upright at 90 degrees   CHL IP OTHER RECOMMENDATIONS 04/21/2016 Recommended Consults Consider GI evaluation;Consider esophageal assessment Oral Care Recommendations Oral care BID Other Recommendations --   CHL IP FOLLOW UP RECOMMENDATIONS 04/21/2016 Follow up Recommendations Skilled Nursing facility;24 hour supervision/assistance   CHL IP FREQUENCY AND DURATION 04/21/2016 Speech Therapy Frequency (ACUTE ONLY) min 1 x/week Treatment Duration 1 week      CHL IP ORAL PHASE 04/21/2016 Oral Phase Impaired Oral - Pudding Teaspoon -- Oral - Pudding Cup -- Oral - Honey Teaspoon -- Oral - Honey Cup -- Oral - Nectar Teaspoon -- Oral - Nectar Cup -- Oral - Nectar Straw -- Oral - Thin Teaspoon -- Oral - Thin Cup WFL Oral - Thin Straw WFL Oral - Puree -- Oral - Mech Soft -- Oral - Regular (No Data) Oral - Multi-Consistency -- Oral -  Pill -- Oral Phase - Comment --  CHL IP PHARYNGEAL PHASE 04/21/2016 Pharyngeal Phase Impaired Pharyngeal- Pudding Teaspoon -- Pharyngeal -- Pharyngeal- Pudding Cup -- Pharyngeal -- Pharyngeal- Honey Teaspoon -- Pharyngeal -- Pharyngeal- Honey Cup -- Pharyngeal -- Pharyngeal- Nectar Teaspoon -- Pharyngeal -- Pharyngeal- Nectar Cup -- Pharyngeal -- Pharyngeal- Nectar Straw -- Pharyngeal -- Pharyngeal- Thin Teaspoon -- Pharyngeal -- Pharyngeal- Thin Cup Pharyngeal residue - valleculae;Pharyngeal residue - pyriform;Reduced laryngeal elevation;Reduced epiglottic inversion Pharyngeal -- Pharyngeal- Thin Straw Pharyngeal residue - valleculae;Pharyngeal residue - pyriform;Reduced laryngeal elevation;Reduced epiglottic inversion Pharyngeal -- Pharyngeal- Puree -- Pharyngeal -- Pharyngeal- Mechanical Soft -- Pharyngeal -- Pharyngeal- Regular Pharyngeal residue - valleculae;Reduced epiglottic inversion;Reduced laryngeal elevation Pharyngeal -- Pharyngeal- Multi-consistency -- Pharyngeal -- Pharyngeal- Pill -- Pharyngeal -- Pharyngeal Comment --  CHL IP CERVICAL ESOPHAGEAL PHASE 04/21/2016 Cervical Esophageal Phase WFL Pudding Teaspoon -- Pudding Cup -- Honey Teaspoon -- Honey Cup -- Nectar Teaspoon -- Nectar Cup -- Nectar Straw -- Thin Teaspoon -- Thin Cup -- Thin Straw -- Puree -- Mechanical Soft -- Regular -- Multi-consistency -- Pill -- Cervical Esophageal Comment -- CHL IP GO 08/08/2015 Functional Assessment Tool Used mbs Functional Limitations Swallowing Swallow Current Status (W4132) CJ Swallow Goal Status (G4010) CJ Swallow Discharge Status (U7253) CJ Motor Speech Current Status (G6440) (None) Motor Speech Goal Status (H4742) (None) Motor Speech Goal Status (V9563) (None) Spoken Language Comprehension Current Status (O7564) (None) Spoken Language Comprehension Goal Status (P3295) (None) Spoken Language Comprehension Discharge Status (J8841) (None) Spoken Language Expression Current Status (Y6063) (None) Spoken Language  Expression Goal Status (K1601) (None) Spoken Language Expression Discharge Status (U9323) (None) Attention Current Status (F5732) (None) Attention Goal Status (K0254) (None) Attention Discharge Status (Y7062) (None) Memory Current Status (B7628) (None) Memory Goal Status (B1517) (None) Memory Discharge Status (O1607) (None) Voice Current Status (P7106) (None) Voice Goal Status (Y6948) (None) Voice Discharge Status (N4627) (None) Other Speech-Language Pathology Functional Limitation Current Status (O3500) (None) Other Speech-Language Pathology Functional Limitation Goal Status (X3818) (None) Other Speech-Language Pathology Functional Limitation Discharge Status [phone]) (None) [place] Siren 04/21/2016, 2:50 PM Orbie Pyo Litaker M.Ed CCC-SLP Pager [phone]                CBC  Recent Labs Lab 04/20/16 0945  04/22/16 0607 04/23/16 0401 04/24/16 0424 04/25/16 0521 04/26/16 0604  WBC 5.8  < > 6.5 7.3 8.3 7.4 8.5  HGB 13.5  < > 14.1 13.5 12.4 12.1 10.6*  HCT 42.0  < > 43.6 42.4 38.3 37.2 33.3*  PLT 455*  < > 456* 472* 434* 423* 419*  MCV 111.4*  < > 111.5* 111.3* 111.0* 112.4*  111.7*  MCH 35.8*  < > 36.1* 35.4* 35.9* 36.6* 35.6*  MCHC 32.1  < > 32.3 31.8 32.4 32.5 31.8  RDW 14.3  < > 14.7 14.5 14.7 14.9 14.4  LYMPHSABS 1.5  --   --   --   --   --   --   MONOABS 0.3  --   --   --   --   --   --   EOSABS 0.2  --   --   --   --   --   --   BASOSABS 0.1  --   --   --   --   --   --   < > = values in this interval not displayed.  Chemistries   Recent Labs Lab 04/21/16 0614 04/21/16 1453 04/23/16 0401 04/24/16 0424 04/25/16 0521  NA 139 142 142 144 143  K 6.1* 4.2 2.3* 3.3* 4.0  CL 107 106 104 107 108  CO2 22 24 28 24 27   GLUCOSE 74 110* 102* 98 82  BUN 8 5* 5* 7 5*  CREATININE 0.43* 0.42* 0.55 0.38* 0.34*  CALCIUM 8.9 8.9 8.7* 8.9 8.7*   ------------------------------------------------------------------------------------------------------------------ estimated creatinine  clearance is 50.2 mL/min (A) (by C-G formula based on SCr of 0.34 mg/dL (L)). ------------------------------------------------------------------------------------------------------------------ No results for input(s): HGBA1C in the last 72 hours. ------------------------------------------------------------------------------------------------------------------ No results for input(s): CHOL, HDL, LDLCALC, TRIG, CHOLHDL, LDLDIRECT in the last 72 hours. ------------------------------------------------------------------------------------------------------------------ No results for input(s): TSH, T4TOTAL, T3FREE, THYROIDAB in the last 72 hours.  Invalid input(s): FREET3 ------------------------------------------------------------------------------------------------------------------ No results for input(s): VITAMINB12, FOLATE, FERRITIN, TIBC, IRON, RETICCTPCT in the last 72 hours.  Coagulation profile No results for input(s): INR, PROTIME in the last 168 hours.  No results for input(s): DDIMER in the last 72 hours.  Cardiac Enzymes No results for input(s): CKMB, TROPONINI, MYOGLOBIN in the last 168 hours.  Invalid input(s): CK ------------------------------------------------------------------------------------------------------------------ Invalid input(s): POCBNP   CBG: No results for input(s): GLUCAP in the last 168 hours.     Studies: No results found.    Lab Results  Component Value Date   HGBA1C 5.4 03/09/2014   Lab Results  Component Value Date   LDLCALC 53 03/09/2014   CREATININE 0.34 (L) 04/25/2016       Scheduled Meds: . chlorhexidine  15 mL Mouth Rinse BID  . diclofenac sodium  2 g Topical QID  . enoxaparin (LOVENOX) injection  80 mg Subcutaneous Q12H  . guaiFENesin  600 mg Oral BID  . ipratropium-albuterol  3 mL Nebulization Q6H  . mouth rinse  15 mL Mouth Rinse q12n4p  . metoprolol  2.5 mg Intravenous Q6H   Continuous Infusions: .  piperacillin-tazobactam (ZOSYN)  IV       LOS: 6 days    Time spent: >35 MINS    [place] Ponder  [facility] Page via [email or web address], password [email or web address]  If 7PM-7AM, please contact night-coverage at [email or web address], password [facility] 04/26/2016, 10:17 AM  LOS: 6 days

## 2016-04-26 NOTE — Progress Notes (Signed)
ANTICOAGULATION CONSULT NOTE - Follow-Up Consult  Pharmacy Consult for lovenox Indication: history of  pulmonary embolus  No Known Allergies  Patient Measurements: Weight: 167 lb 5.3 oz (75.9 kg)  Vital Signs: Temp: 98.3 F (36.8 C) (04/22 0627) Temp Source: Oral (04/22 0627) BP: 114/75 (04/22 0627) Pulse Rate: 101 (04/22 0627)  Labs:  Recent Labs  04/24/16 0424 04/25/16 0521 04/26/16 0604  HGB 12.4 12.1 10.6*  HCT 38.3 37.2 33.3*  PLT 434* 423* 419*  CREATININE 0.38* 0.34*  --     Assessment: 83 yo F presented to the ED with cough and being treated for aspiration PNA. She is on chronic apixaban for history of PE but was transitioned to Lovenox while NPO pending speech/swallow eval and diet recommendations. The patient has now been started on a dysphagia 3 diet however requesting a second opinion from [facility] regarding possible thyroidectomy. Continuing Lovenox at this time. Renal function and CBC stable. Dose remains appropriate.   Goal of Therapy:  Anti-Xa level 0.6-1 units/ml 4hrs after LMWH dose given Monitor platelets by anticoagulation protocol: Yes   Plan:  1. Continue Lovenox 80 mg SQ every 12 hours 2. Will continue to monitor for any signs/symptoms of bleeding and renal function for necessary dose adjustments 3. Will f/u CCS recommendations and plans to transition back to apixaban if surgery not an option  Thank you for allowing pharmacy to be a part of this patient's care.  Alycia Rossetti, PharmD, BCPS Clinical Pharmacist Pager: [phone] 04/26/2016 2:45 PM

## 2016-04-26 NOTE — Consult Note (Signed)
Consultation Note Date: 04/26/2016   Patient Name: Sandy Barrett  DOB: 08-Aug-1932  MRN: [id number]  Age / Sex: 83 y.o., female  PCP: [name], MD Referring Physician: Domenic Polite, MD  Reason for Consultation: Establishing goals of care and Psychosocial/spiritual support  HPI/Patient Profile: 83 y.o. 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 Loanne Drilling, hypertension, migraine headaches, history of PE admitted on 04/20/2016 with off 1 week. Patient was found to have aspiration pneumonia and dysphagia. Patient has a large multinodular goiter. 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   Clinical Assessment and Goals of Care: Patient is somnolent but will awaken to voice and light touch. Her 2 daughters [name], and [place], are present at the bedside. Mrs. Heyde, has copious upper airway secretions that has affected the quality and ability for her to speak clearly. She is struggling to swallow at this point. Prior to this, daughter states "just 2 weeks ago we didn't see any of this". Per daughters patient has known about her goiter since before she had her stroke in 2016 and has always put off doing anything about it. They state that their mother has shared some end-of-life wishes with them such as DO NOT RESUSCITATE and that she would not want a PEG tube. Family allowed me to discuss multiple options such as potential surgery, risks and benefits; ongoing risk of aspiration giving copious secretions, radioactive iodine treatment, all in the context of worsening debility, dysphagia from large left MCA stroke that occurred in 2016. Daughter [name] shared "I know my mom is at end-of-life end-of-life I just want her to be more  comfortable and see if there is anything that can be done to help her". Her sister [name] is in agreement with getting a second surgical opinion for assessing how to move forward. We did talk about symptom based, comfort approach treatment; also discussed with inpatient hospice as a potential alternative, should it be decided that she is not a surgical candidate. Patient has been deemed not a good surgical candidate in the past  NEXT OF KIN daughters [name] and [name] [place]    SUMMARY OF RECOMMENDATIONS   DNR/DNI No PEG tube Family awaiting a second surgical opinion which will likely take place on 04/27/2016 Palliative medicine to stay involved and help family process what their next steps are to be given surgical opinion Code Status/Advance Care Planning:  DNR    Symptom Management:   Secretions: Discussed with daughters there are medications that can help dry up secretions but those were to worsen dry mouth which might actually make it more difficult for her to swallow. At this point we will hold off until we get further information from second surgical opinion  She otherwise does not appear to be in any distress  Palliative Prophylaxis:   Aspiration, Bowel Regimen, Frequent Pain Assessment, Oral Care and Turn Reposition  Additional Recommendations (Limitations, Scope, Preferences):  Initiate Comfort Feeding and No Artificial Feeding  Psycho-social/Spiritual:   Desire for further Chaplaincy support:no  Additional Recommendations: Grief/Bereavement Support  Prognosis:   Unable to determine  Discharge Planning: To Be Determined      Primary Diagnoses: Present on Admission: . PNA (pneumonia) . Spastic hemiparesis affecting dominant side ([place]), right . Essential hypertension . Hyperthyroidism . Aspiration pneumonia ([place])   I have reviewed the medical record, interviewed the patient and family, and examined the patient. The following aspects are  pertinent.  Past Medical History:  Diagnosis Date  . ARTHRITIS   . Arthritis   . Diverticulosis   . HYPERTENSION   . HYPERTHYROIDISM   . Hyperthyroidism    s/p I-131 ablation 03/2011 of multinod goiter  . INSOMNIA   . MIGRAINE HEADACHE   . OBESITY   . Posttraumatic stress disorder   . Pulmonary embolism ([place]) 03/2014   with DVT  . Stroke [facility]) 03/2014   dysarthria   Social History   Social History  . Marital status: Widowed    Spouse name: N/A  . Number of children: 2  . Years of education: 14   Occupational History  . retired     Restaurant manager, fast food   Social History Main Topics  . Smoking status: Never Smoker  . Smokeless tobacco: Never Used  . Alcohol use No  . Drug use: No  . Sexual activity: No   Other Topics Concern  . None   Social History Narrative   Widowed, lived alone prior to CVA 03/2014   SNF at [facility], then home 07/2014 with dtr   Right handed   Caffeine use- occasionally drinks tea   Family History  Problem Relation Age of Onset  . Asthma Mother   . Asthma Father   . Prostate cancer Father   . Stroke Brother   . Breast cancer Sister   . Stomach cancer Sister   . Thyroid disease Neg Hx    Scheduled Meds: . chlorhexidine  15 mL Mouth Rinse BID  . diclofenac sodium  2 g Topical QID  . enoxaparin (LOVENOX) injection  80 mg Subcutaneous Q12H  . guaiFENesin  600 mg Oral BID  . hydroxyurea  500 mg Oral Daily  . ipratropium-albuterol  3 mL Nebulization Q6H  . mouth rinse  15 mL Mouth Rinse q12n4p  . metoprolol  2.5 mg Intravenous Q6H   Continuous Infusions: . piperacillin-tazobactam (ZOSYN)  IV     PRN Meds:.acetaminophen **OR** acetaminophen, albuterol, bisacodyl, ondansetron **OR** ondansetron (ZOFRAN) IV, sennosides, traMADol, traZODone Medications Prior to Admission:  Prior to Admission medications   Medication Sig Start Date End Date Taking? Authorizing Provider  acetaminophen (TYLENOL) 160 MG/5ML liquid Take 480 mg by mouth  every 4 (four) hours as needed for fever or pain.   Yes Historical Provider, MD  albuterol (PROVENTIL HFA) 108 (90 Base) MCG/ACT inhaler Inhale 2 puffs into the lungs every 6 (six) hours as needed for wheezing or shortness of breath. 05/15/15  Yes Burnard Hawthorne, FNP  amoxicillin-clavulanate (AUGMENTIN) 875-125 MG tablet Take 1 tablet by mouth 2 (two) times daily. 04/13/16  Yes Delano Metz, FNP  apixaban (ELIQUIS) 2.5 MG TABS tablet Take 1 tablet (2.5 mg total) by mouth 2 (two) times daily. 02/07/16  Yes [name], MD  cephALEXin (KEFLEX) 250 MG capsule Take 250 mg by mouth daily. UTI Prevention 01/29/16  Yes Historical Provider, MD  diclofenac sodium (VOLTAREN) 1 % GEL APPLY (2GMS)  TOPICALLY THREE TIMES DAILY. 01/30/16  Yes [name], MD  fluconazole (DIFLUCAN) 150 MG tablet Take 150 mg by mouth as needed.  11/14/15  Yes Historical Provider, MD  gabapentin (NEURONTIN) 100 MG capsule Take 1 capsule (100 mg total) by mouth 3 (three) times daily. Patient taking differently: Take 100 mg by mouth 2 (two) times daily.  08/12/15  Yes [name], MD  hydroxyurea (HYDREA) 500 MG capsule Take 2 capsules (1,000 mg total) by mouth daily. May take with food to minimize GI side effects. Patient taking differently: Take 500 mg by mouth daily.  08/23/15  Yes Heath Lark, MD  methimazole (TAPAZOLE) 5 MG tablet TAKE 1 TABLET BY MOUTH TWICE WEEKLY Patient taking differently: TAKE 1 TABLET BY MOUTH TWICE WEEKLY (Sun and Thurs) 04/19/16  Yes Renato Shin, MD  polyethylene glycol (MIRALAX / GLYCOLAX) packet TAKE 17G BY MOUTH TWICE A DAY Patient taking differently: TAKE 17G BY MOUTH TWICE A DAY as needed 03/12/16  Yes [name], MD  Probiotic Product (ALIGN) 4 MG CAPS Take 1 capsule by mouth daily.    Yes Historical Provider, MD  propranolol (INDERAL) 10 MG tablet TAKE 1 TABLET TWICE A DAY Patient taking differently: TAKE 1 TABLET once A DAY 12/02/15  Yes [name], MD  PROTONIX 40 MG PACK TAKE 20 MLS (40 MG  TOTAL) BY MOUTH DAILY. Patient taking differently: Take 40 mg by mouth daily. Mix with applesauce 02/13/16  Yes [name], MD  terconazole (TERAZOL 7) 0.4 % vaginal cream Place 1 applicator vaginally at bedtime. Patient taking differently: Place 1 applicator vaginally at bedtime as needed (irritation).  04/18/15  Yes Lavonia Drafts, MD  traMADol (ULTRAM) 50 MG tablet TAKE 1 TO 2 TABLETS BY MOUTH 3 TIMES A DAY AS NEEDED Patient taking differently: TAKE 2 TABLETS BY MOUTH 3 TIMES A DAY AS NEEDED 02/07/16  Yes [name], MD  traZODone (DESYREL) 50 MG tablet TAKE 1 TABLET (50 MG TOTAL) BY MOUTH AT BEDTIME AS NEEDED. FOR SLEEP 02/13/16  Yes [name], MD   No Known Allergies Review of Systems  Unable to perform ROS: Patient nonverbal    Physical Exam  Constitutional: She appears well-nourished.  Cardiovascular: Normal rate.   Pulmonary/Chest:  Copious upper airway secretions  Neurological:  Somnolent but will arouse to voice and light touch Voice is very difficult to understand secondary to secretions  Skin: Skin is warm and dry.  Psychiatric:  Unable to test  Nursing note and vitals reviewed.   Vital Signs: BP 92/77 (BP Location: Right Arm)   Pulse 99   Temp 98.2 F (36.8 C) (Oral)   Resp 18   Wt 75.9 kg (167 lb 5.3 oz)   SpO2 100%   BMI 32.68 kg/m  Pain Assessment: PAINAD   Pain Score: Asleep   SpO2: SpO2: 100 % O2 Device:SpO2: 100 % O2 Flow Rate: .O2 Flow Rate (L/min): 2 L/min  IO: Intake/output summary: No intake or output data in the 24 hours ending 04/26/16 1732  LBM: Last BM Date: 04/22/16 Baseline Weight: Weight: 79.8 kg (176 lb) Most recent weight: Weight: 75.9 kg (167 lb 5.3 oz)     Palliative Assessment/Data:   Flowsheet Rows     Most Recent Value  Intake Tab  Referral Department  Hospitalist  Unit at Time of Referral  Med/Surg Unit  Palliative Care Primary Diagnosis  Sepsis/Infectious Disease  Date Notified  04/24/16  Reason for referral   Clarify Goals of  Care, Counsel Regarding Hospice, Psychosocial or Spiritual support  Date of Admission  04/20/16  Date first seen by Palliative Care  04/26/16  # of days Palliative referral response time  2 Day(s)  # of days IP prior to Palliative referral  4  Clinical Assessment  Palliative Performance Scale Score  30%  Pain Max last 24 hours  Not able to report  Pain Min Last 24 hours  Not able to report  Dyspnea Max Last 24 Hours  Not able to report  Dyspnea Min Last 24 hours  Not able to report  Nausea Max Last 24 Hours  Not able to report  Nausea Min Last 24 Hours  Not able to report  Anxiety Max Last 24 Hours  Not able to report  Anxiety Min Last 24 Hours  Not able to report  Other Max Last 24 Hours  Not able to report  Psychosocial & Spiritual Assessment  Palliative Care Outcomes  Patient/Family meeting held?  Yes  Who was at the meeting?  daughters [name] and [place] CPR status, Provided psychosocial or spiritual support, Clarified goals of care, Counseled regarding hospice  Patient/Family wishes: Interventions discontinued/not started   Mechanical Ventilation, PEG, Trach, Vasopressors  Palliative Care follow-up planned  Yes, Facility      Time In: 1630 Time Out: 1740 Time Total: 70 min Greater than 50%  of this time was spent counseling and coordinating care related to the above assessment and plan. Staffed with Dr. Eliezer Champagne Signed by: [name], NP   Please contact Palliative Medicine Team phone at [phone] for questions and concerns.  For individual provider: See Shea Evans

## 2016-04-27 DIAGNOSIS — L8990 Pressure ulcer of unspecified site, unspecified stage: Secondary | ICD-10-CM | POA: Insufficient documentation

## 2016-04-27 LAB — CBC
HCT: 30.7 % — ABNORMAL LOW (ref 36.0–46.0)
Hemoglobin: 9.9 g/dL — ABNORMAL LOW (ref 12.0–15.0)
MCH: 36.4 pg — ABNORMAL HIGH (ref 26.0–34.0)
MCHC: 32.2 g/dL (ref 30.0–36.0)
MCV: 112.9 fL — ABNORMAL HIGH (ref 78.0–100.0)
Platelets: 383 10*3/uL (ref 150–400)
RBC: 2.72 MIL/uL — ABNORMAL LOW (ref 3.87–5.11)
RDW: 14.6 % (ref 11.5–15.5)
WBC: 6.6 10*3/uL (ref 4.0–10.5)

## 2016-04-27 MED ORDER — APIXABAN 2.5 MG PO TABS
2.5000 mg | ORAL_TABLET | Freq: Two times a day (BID) | ORAL | Status: DC
  Administered 2016-04-27 – 2016-04-29 (×4): 2.5 mg via ORAL
  Filled 2016-04-27 (×4): qty 1

## 2016-04-27 MED ORDER — METHIMAZOLE 5 MG PO TABS
5.0000 mg | ORAL_TABLET | Freq: Two times a day (BID) | ORAL | Status: DC
  Administered 2016-04-27 – 2016-04-29 (×5): 5 mg via ORAL
  Filled 2016-04-27 (×6): qty 1

## 2016-04-27 MED ORDER — PROPRANOLOL HCL 10 MG PO TABS
10.0000 mg | ORAL_TABLET | Freq: Two times a day (BID) | ORAL | Status: DC
  Administered 2016-04-27 – 2016-04-28 (×3): 10 mg via ORAL
  Filled 2016-04-27 (×6): qty 1

## 2016-04-27 NOTE — Telephone Encounter (Signed)
Patient has been admitted to [facility]. I contacted the patient's daughter [name] and advised once the patient was release to please call back and schedule a follow up appointment.

## 2016-04-27 NOTE — Progress Notes (Signed)
Physical Therapy Treatment Patient Details Name: Sandy Barrett MRN: [id number] DOB: 30-Jul-1932 Today's Date: 04/27/2016    History of Present Illness 83 y.o. female with medical history significant of Hyperthyroidism, HTN, migraine headache, left MCA stroke with residual right sided weakness and aphasia 2016,  PE admitted due to aspiration PNA.    PT Comments    Emphasis on ROM, positioning, sitting EOB for tolerance, pulmonary toilet, and reaching task.   Follow Up Recommendations  Supervision/Assistance - 24 hour;No PT follow up     Equipment Recommendations  Other (comment) (large or more encasing sling for the hoyer, Ramp resource)    Recommendations for Other Services       Precautions / Restrictions Precautions Precautions: Fall Precaution Comments: flaccid RUE    Mobility  Bed Mobility Overal bed mobility: Needs Assistance Bed Mobility: Sidelying to Sit;Sit to Sidelying   Sidelying to sit: Max assist;Total assist     Sit to sidelying: Max assist;Total assist General bed mobility comments: pt arches to assist, but unable to use left side coming up from left.  Transfers                    Ambulation/Gait                 Stairs            Wheelchair Mobility    Modified Rankin (Stroke Patients Only)       Balance Overall balance assessment: Needs assistance Sitting-balance support: Single extremity supported;No upper extremity supported Sitting balance-Leahy Scale: Poor Sitting balance - Comments: sat EOB x 15 min work on w/shifts, reaching, upright sitting and generally giving her lungs time to "toilet" a bit.                                    Cognition Arousal/Alertness: Awake/alert Behavior During Therapy: WFL for tasks assessed/performed Overall Cognitive Status: History of cognitive impairments - at baseline                                        Exercises Other Exercises Other  Exercises: reviewed positioning and elevation of RUE for edema management. dtr. that was present is main caregiver and was able to show how she layers pillows,blankets, and towels for elevation.   Other Exercises: PROM to L UE/L LE, repositioning L UE.  AROM to R UE and R LE.    General Comments General comments (skin integrity, edema, etc.): Discussion of home equipment needs-- generally good except they could use a more "cocooning" sling that cradles pt, esp UE. and would like resource for building another ramp on the front.      Pertinent Vitals/Pain Pain Assessment: Faces Faces Pain Scale: Hurts little more Pain Location: generalized with LE exercises.   Pain Descriptors / Indicators: Grimacing Pain Intervention(s): Monitored during session;Repositioned    Home Living                      Prior Function            PT Goals (current goals can now be found in the care plan section) Acute Rehab PT Goals Patient Stated Goal: To return home PT Goal Formulation: With patient/family Time For Goal Achievement: 04/28/16 Potential to Achieve Goals: Fair Progress towards PT goals: Progressing toward  goals    Frequency    Min 3X/week      PT Plan Current plan remains appropriate    Co-evaluation             End of Session   Activity Tolerance: Patient tolerated treatment well Patient left: in bed;with family/visitor present;with bed alarm set Nurse Communication: Mobility status PT Visit Diagnosis: Other abnormalities of gait and mobility (R26.89);Hemiplegia and hemiparesis Hemiplegia - Right/Left: Right Hemiplegia - dominant/non-dominant: Dominant Hemiplegia - caused by: Cerebral infarction     Time: 1103-1594 PT Time Calculation (min) (ACUTE ONLY): 49 min  Charges:  $Therapeutic Exercise: 8-22 mins $Therapeutic Activity: 23-37 mins                    G Codes:       May 16, 2016  Donnella Sham, PT [phone]  (pager)   Tessie Fass  Jullie Arps 2016/05/16, 5:55 PM

## 2016-04-27 NOTE — Progress Notes (Signed)
Subjective: Sandy Barrett is a known patient to the [facility] practice, seen by Dr. Armandina Gemma last year for her goiter. Patient has a medical history of large MCA stoke with right hemiplegia, dysphagia, aphasia, hyperthyroidism with a multinodular goiter, thrombocytosis. and hypertension. Per family request, general surgery is asked to see for a second opinion regarding surgical intervention for patients goiter. Family and patient both feel that, while dysphagia originated due to CVA, it is exacerbated by patients goiter and they want to know if surgical intervention may provide symptomatic relief. The patient has be deemed a poor surgical candidate in the past.    Objective: Vital signs in last 24 hours: Temp:  [98.1 F (36.7 C)-99.3 F (37.4 C)] 99.3 F (37.4 C) (04/23 6629) Pulse Rate:  [98-100] 98 (04/23 0613) Resp:  [18] 18 (04/23 0613) BP: (92-115)/(67-77) 115/74 (04/23 0613) SpO2:  [98 %-100 %] 99 % (04/23 4765) Weight:  [73.8 kg (162 lb 12.8 oz)] 73.8 kg (162 lb 12.8 oz) (04/23 0500) Last BM Date: 04/22/16  Intake/Output from previous day: No intake/output data recorded. Intake/Output this shift: No intake/output data recorded.  PE: Gen:  Alert, NAD, pleasant Eyes: EOM's in tact Neck: palpable goiter  Card:  Regular rate and rhythm Pulm:  Non-labored, clear to auscultation bilaterally Abd: Soft, obese, bowel sounds present Ext:  No erythema, edema, or tenderness  Lab Results:   Recent Labs (last 2 labs)    Recent Labs  04/25/16 0521 04/26/16 0604  WBC 7.4 8.5  HGB 12.1 10.6*  HCT 37.2 33.3*  PLT 423* 419*     BMET  Recent Labs (last 2 labs)    Recent Labs  04/25/16 0521  NA 143  K 4.0  CL 108  CO2 27  GLUCOSE 82  BUN 5*  CREATININE 0.34*  CALCIUM 8.7*     PT/INR Recent Labs (last 2 labs)   No results for input(s): LABPROT, INR in the last 72 hours.   CMP     Labs (Brief)          Component Value Date/Time   NA 143 04/25/2016 0521    K 4.0 04/25/2016 0521   CL 108 04/25/2016 0521   CO2 27 04/25/2016 0521   GLUCOSE 82 04/25/2016 0521   BUN 5 (L) 04/25/2016 0521   CREATININE 0.34 (L) 04/25/2016 0521   CALCIUM 8.7 (L) 04/25/2016 0521   PROT 6.6 01/26/2016 2115   ALBUMIN 3.4 (L) 01/26/2016 2115   AST 16 01/26/2016 2115   ALT 13 (L) 01/26/2016 2115   ALKPHOS 61 01/26/2016 2115   BILITOT 0.5 01/26/2016 2115   GFRNONAA >60 04/25/2016 0521   GFRAA >60 04/25/2016 0521     Lipase  Labs (Brief)          Component Value Date/Time   LIPASE 31 06/27/2015 0454         Studies/Results: Imaging Results (Last 48 hours)  No results found.    Anti-infectives:           Anti-infectives    Start     Dose/Rate Route Frequency Ordered Stop   04/20/16 2359  piperacillin-tazobactam (ZOSYN) IVPB 3.375 g     3.375 g 12.5 mL/hr over 240 Minutes Intravenous Every 8 hours 04/20/16 2341     04/20/16 1230  piperacillin-tazobactam (ZOSYN) IVPB 3.375 g     3.375 g 100 mL/hr over 30 Minutes Intravenous  Once 04/20/16 1227 04/20/16 1713     Assessment/Plan Multinodular goiter  - CT scan with slight  interval growth of goiter, no significant changed compared to 2017  - patient's age, respiratory status, and history of CVA in 2016 make her a poor surgical candidate, as determined by Dr. Harlow Asa in the past. After seeing the patient and reviewing her chart I do not recommend thyroidectomy as this time, I believe this risks of surgery outweigh the potential symptomatic relief to the patient.  - Patients thyroid disease currently managed by Dr. Loanne Drilling and, according to primary teams note, he agrees that due to patients current state of health she may not tolerate isolation and radioactive iodine therapy at this time, but plans to follow up with patient after she is discharged from the hospital to discuss this further.    Left MCA stroke with resulting right hemiplegia, aphasia, dysphagia (2016) HTN Hx  PE (2016) - on eliquis     LOS: 7 days    Jill Alexanders , [facility] 04/27/2016, 8:13 AM Pager: [phone] Consults: [phone] Mon-Fri 7:00 am-4:30 pm Sat-Sun 7:00 am-11:30 am

## 2016-04-27 NOTE — Progress Notes (Signed)
[facility] PROGRESS NOTE  Sandy Barrett GHW:[id number] DOB: 1932/04/23 DOA: 04/20/2016   PCP: [name], MD  Narrative: 83 y.o. 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 Found to have aspiration pneumonia and dysphagia  Assessment and plan  Aspiration pneumonia -has multifactorial dysphagia -due to advanced age, Debility, large MCA stroke in 2016 and large multinodular goitre -s/p SLP evaluation, on D3 diet now -on IV Zosyn day 7, nebs for pulm toilet, NT suctioning PRN etc -see discussion on goitre below -I discussed the need for Palliative care discussions and limited options with dysphagia/aspiration pneumonia and limited options for multinodular goitre as discussed below -Palliative consulted for Goals of care, s/p family meeting, DNR, family more realistic than seemed earlier  Multinodular goitre -Repeat CT soft tissue neck,  shows only slight interval growth of large goiter, no significant change compared to 2017 she is not a good surgical candidate she has already seen [facility] Dr.Gerkin in 2017, I called and d/w Dr.Sean Loanne Drilling and he felt that due to her debilitated status she may not be able to manage isolation for 5days and radioactive iodine at this time, daughter had more questions abt Radioactive iodone/risk to care giver, I have asked her to talk to [facility] abt this, notes from EMR reviewed, plan for outpatient FU with Dr.Ellison next week to decide on radio-active iodine option -Last TSH and freeT4 on 04/01/16 were normal on current dose of Methimazole, resume methimazole -on CT chest  the SVC is displaced and effaced and has chronically occluded Left ICA/IJ -family requested second  surgical opinion for thyroidectomy despite being told numerous times that she is a very poor surgical candidate given her tenuous resp status, due to persistent family request asked [facility] for input, felt to be a poor surgical candidate  Hyperkalemia -s/p one dose of Kayexalate  Hypertension -Currently controlled, monitor  Large MCA stroke with Spastic hemiparesis (right sided), dysphagia and aphasia - bed/wheel chair bound at baseline -and h/o DVT and PE in 2016. -on eliquis at home, now on lovenox , restart home eliquis  Patient is on Eliquis for PE, - currently on Lovenox  bid, resume eliquis today2  Thrombocytosis  Followed by Dr Waldon Merl   DVT prophylaxsis Lovenox  Code Status:   Full code Family Communication: daughter at bedside Disposition Plan: home soon, pending further palliative discussions    Consultants: Palliative consult  Procedures:  None  Antibiotics: Anti-infectives    Start     Dose/Rate Route Frequency Ordered Stop   04/20/16 2359  piperacillin-tazobactam (ZOSYN) IVPB 3.375 g     3.375 g 12.5 mL/hr over 240 Minutes Intravenous Every 8 hours 04/20/16 2341     04/20/16 1230  piperacillin-tazobactam (ZOSYN) IVPB 3.375 g     3.375 g 100 mL/hr over 30 Minutes Intravenous  Once 04/20/16 1227 04/20/16 1713         HPI/Subjective: Breathing improving, some gurgling after meals  Objective: Vitals:   04/27/16 0500 04/27/16 0613 04/27/16 0813 04/27/16 1256  BP:  115/74  116/75  Pulse:  98  98  Resp:  18    Temp:  99.3 F (37.4 C)    TempSrc:  Oral    SpO2:  99% 98%   Weight: 73.8 kg (162 lb 12.8 oz)       Intake/Output Summary (Last 24 hours) at 04/27/16 1307 Last data filed at 04/27/16 1107  Gross per 24 hour  Intake               20 ml  Output                0 ml  Net               20 ml    Exam:  Examination:   General: chronically ill female, frail, aphasic Lungs:: few ronchi, conducted upper airway sounds, scant r base  ronchi Cardiovascular system: S1 & S2 heard, RRR. No JVD, murmurs, rubs Abd: : nondistended, soft and nontender. Normal bowel sounds heard. Central nervous system: awake, aphasic, R hemiplegia, unchanged Extremities: R hemiplegia Skin: No rashes, lesions or ulcers Psychiatry: unable to assess, pleasant    Data Reviewed: I have personally reviewed following labs and imaging studies  Micro Results Recent Results (from the past 240 hour(s))  Culture, blood (routine x 2) Call MD if unable to obtain prior to antibiotics being given     Status: None   Collection Time: 04/20/16  7:35 PM  Result Value Ref Range Status   Specimen Description BLOOD LEFT ARM  Final   Special Requests IN PEDIATRIC BOTTLE BCAV  Final   Culture NO GROWTH 5 DAYS  Final   Report Status 04/25/2016 FINAL  Final  Culture, blood (routine x 2) Call MD if unable to obtain prior to antibiotics being given     Status: Abnormal   Collection Time: 04/20/16  7:37 PM  Result Value Ref Range Status   Specimen Description BLOOD RIGHT HAND  Final   Special Requests IN PEDIATRIC BOTTLE Blood Culture adequate volume  Final   Culture  Setup Time   Final    GRAM POSITIVE COCCI IN CLUSTERS IN PEDIATRIC BOTTLE CRITICAL RESULT CALLED TO, READ BACK BY AND VERIFIED WITH: CARON AMEND,PHARMD @0025  04/22/16 MKELLY,MLT    Culture (A)  Final    STAPHYLOCOCCUS SPECIES (COAGULASE NEGATIVE) THE SIGNIFICANCE OF ISOLATING THIS ORGANISM FROM A SINGLE SET OF BLOOD CULTURES WHEN MULTIPLE SETS ARE DRAWN IS UNCERTAIN. PLEASE NOTIFY THE MICROBIOLOGY DEPARTMENT WITHIN ONE WEEK IF SPECIATION AND SENSITIVITIES ARE REQUIRED.    Report Status 04/23/2016 FINAL  Final    Radiology Reports Dg Chest 1 View  Result Date: 04/14/2016 CLINICAL DATA:  Chest congestion, cough, dysphagia for the past 5 days. History previous CVA, pulmonary embolism. Patient unable to raise the chin above the chest. EXAM: CHEST 1 VIEW COMPARISON:  Portable chest x-ray of May 31, 2015 and chest CT scan of May 31, 2015. FINDINGS: Again demonstrated is a large soft tissue mass which deviates the trachea to the right most compatible with a goiter which is been demonstrated on previous chest x-ray and CT scan. The lungs are well-expanded. There are stable calcifications in the left upper hemithorax which lie in the soft tissues superficial to the scapula on the previous CT scan. The cardiac silhouette remains enlarged. The pulmonary vascularity is not engorged. There is calcification in the wall of the aortic arch. There is degenerative change of both shoulders. IMPRESSION: No acute pneumonia nor pulmonary edema.  Stable mild cardiomegaly. Thoracic aortic atherosclerosis. Large goiter with deviation of the trachea toward the right with narrowing of the tracheal lumen. This has progressed somewhat since the previous study. Electronically Signed   By:  David  Martinique M.D.   On: 04/14/2016 08:28   Dg Chest 2 View  Result Date: 04/22/2016 CLINICAL DATA:  Acute onset of cough.  Initial encounter. EXAM: CHEST  2 VIEW COMPARISON:  Chest radiograph performed 04/20/2016 FINDINGS: The lungs are well-aerated. Right perihilar opacity is new from the study performed 2 days ago, and may reflect pneumonia. There is no evidence of pleural effusion or pneumothorax. Underlying vascular congestion is noted. As before, a very large thyroid goiter displaces the trachea to the right, with tracheal narrowing. This finding is stable from 2012. The heart is mildly enlarged. No acute osseous abnormalities are seen. Degenerative change is noted at the glenohumeral joints. IMPRESSION: 1. Right perihilar opacity is new from the study performed 2 days ago, and may reflect pneumonia. Underlying vascular congestion and mild cardiomegaly. 2. Very large thyroid goiter again noted, displacing the trachea to the right, with tracheal narrowing. This is stable from 2012. Electronically Signed   By: Garald Balding M.D.   On:  04/22/2016 19:47   Dg Chest 2 View  Result Date: 04/20/2016 CLINICAL DATA:  Cough and shortness of breath. EXAM: CHEST  2 VIEW COMPARISON:  04/13/2016 and chest CT on 05/31/2015 FINDINGS: Heart size is stable. Large mediastinal mass resulting in tracheal narrowing and deviation to the right is stable, and consistent with large substernal goiter, as demonstrated on previous CT. No evidence of pulmonary infiltrate or edema. Limited visualization on lateral projection due to position of patient's arms. IMPRESSION: No acute findings. Stable large substernal goiter causing tracheal narrowing and deviation to the right. Electronically Signed   By: Earle Gell M.D.   On: 04/20/2016 10:13   Ct Soft Tissue Neck W Contrast  Result Date: 04/21/2016 CLINICAL DATA:  Continued surveillance multinodular goiter. EXAM: CT NECK WITH CONTRAST TECHNIQUE: Multidetector CT imaging of the neck was performed using the standard protocol following the bolus administration of intravenous contrast. CONTRAST:  58mL ISOVUE-300 IOPAMIDOL (ISOVUE-300) INJECTION 61% COMPARISON:  07/01/2015. FINDINGS: Pharynx and larynx: Normal pharyngeal structures. Severe rightward displacement of the proximal trachea and larynx due to the leftward dominant multinodular goiter. This may be increased from prior of June 2017. Salivary glands: No inflammation, mass, or stone. Thyroid: Markedly enlarged and heterogeneous thyroid, with multiple nodules and scattered calcifications, significant substernal extent. In addition to mass effect on the trachea, the SVC is displaced and effaced. Approximate measurements are 97 x 45 x 68 mm, although the entire craniocaudal extent is not assessed on this CT neck study. Lymph nodes: None enlarged or abnormal density. Vascular: Occluded LEFT ICA. Calcifications in the LEFT internal jugular are redemonstrated consistent with chronic thrombosis. Limited intracranial: LEFT temporal lobe encephalomalacia better visualized on  previous exam. Visualized orbits: Not visualized. Mastoids and visualized paranasal sinuses: Clear. Skeleton: No acute osseous findings. Cervical spondylosis. Nuchal ligament ossification. Upper chest: No pulmonary nodule or pneumothorax. Other: None. IMPRESSION: Slight interval growth since 2017 of large goiter. Significant LEFT-to-RIGHT mass effect on the trachea and SVC; see discussion above. Electronically Signed   By: Staci Righter M.D.   On: 04/21/2016 13:19   Dg Esophagus  Result Date: 04/23/2016 CLINICAL DATA:  Dysphagia.  Substernal goiter. EXAM: ESOPHOGRAM/BARIUM SWALLOW TECHNIQUE: Single contrast examination was performed using  thin barium. FLUOROSCOPY TIME:  Fluoroscopy Time: 1 minutes 48 seconds ; low dose pulsed fluoroscopy Radiation Exposure Index (if provided by the fluoroscopic device): 15.6 mGy Number of Acquired Spot Images: 0 COMPARISON:  None. FINDINGS: Technically difficult exam as patient could not drink  from cup or straw, and thin barium was administered orally by syringe. The patient could only take small swallows of barium. A large substernal goiter is seen causing marked displacement of the esophagus to the right. A smooth stricture of the upper esophagus is seen at level thoracic inlet, likely due to external compression by substernal goiter. This does not obstruct the passage of thin barium. No evidence of distal esophageal stricture or hiatal hernia. Lack of primary peristalsis resulting in esophageal stasis. IMPRESSION: Technically difficult exam. Large substernal goiter causes marked displacement of esophagus to the right. Smooth stricture of the upper esophagus at level of thoracic inlet is likely due to external compression by substernal goiter. Electronically Signed   By: Earle Gell M.D.   On: 04/23/2016 14:45   Dg Swallowing Func-speech Pathology  Result Date: 04/21/2016 Objective Swallowing Evaluation: Type of Study: MBS-Modified Barium Swallow Study Patient Details  Name: Latiesha Harada MRN: [id number] Date of Birth: [birth year] Today's Date: 04/21/2016 Time: SLP Start Time (ACUTE ONLY): 1039-SLP Stop Time (ACUTE ONLY): 1053 SLP Time Calculation (min) (ACUTE ONLY): 14 min Past Medical History: Past Medical History: Diagnosis Date . ARTHRITIS  . Arthritis  . Diverticulosis  . HYPERTENSION  . HYPERTHYROIDISM  . Hyperthyroidism   s/p I-131 ablation 03/2011 of multinod goiter . INSOMNIA  . MIGRAINE HEADACHE  . OBESITY  . Posttraumatic stress disorder  . Pulmonary embolism ([place]) 03/2014  with DVT . Stroke [facility]) 03/2014  dysarthria Past Surgical History: Past Surgical History: Procedure Laterality Date . ABDOMINAL HYSTERECTOMY  1976 . BREAST SURGERY    biopsy . RADIOLOGY WITH ANESTHESIA Left 03/08/2014  Procedure: RADIOLOGY WITH ANESTHESIA;  Surgeon: Rob Hickman, MD;  Location: [place];  Service: Radiology;  Laterality: Left; . TONSILLECTOMY   . TOTAL HIP ARTHROPLASTY  1998   right HPI: Ptis a 83 y.o.femalewith medical history significant of hyperthyroidism, HTN, migraine headache, left MCA stroke with residual right sided weakness and aphasia, and PE who presented to the ED with c/o cough and wheezing. Patient seen by PCP on 04/13/2016 and diagnosed with CAP and UTI. CXR showed no acute findings, stable large substernal goiter causing tracheal narrowing and deviation to the right. No Data Recorded Assessment / Plan / Recommendation CHL IP CLINICAL IMPRESSIONS 04/21/2016 Clinical Impression Ms. Abernathy presents with a mild oral/pharyngeal dysphagia, suspect primary esophageal dysphagia. Oral phase impairments included mild prolonged mastication with regular solids (pt wears partial dentures). Observed mild vallecular and pyriform sinus residue with thin liquids via cup/straw due to reduced laryngeal elevation and epiglottic inversion. MBS does not diagnose below the level of the UES however scan of esophagus revealed stasis within the mid-esophagus and questionable stenosis. Pt  previously diagnosed with a substernal goiter causing tracheal narrowing and deviation to the right possible causing observations. Educated pt re: results of MBS, diet recommendations, esophageal precautions (alternate solids/liquids, sit upright after meals), and advised pt to consider GI consult due to esophageal findings. Recommend Dys 3 solids, thin liquids (straws ok), meds crushed. ST will f/u briefly to assess diet tolerance and for continued education. SLP Visit Diagnosis Dysphagia, pharyngoesophageal phase (R13.14) Attention and concentration deficit following -- Frontal lobe and executive function deficit following -- Impact on safety and function Mild aspiration risk   CHL IP TREATMENT RECOMMENDATION 04/21/2016 Treatment Recommendations Therapy as outlined in treatment plan below   Prognosis 04/21/2016 Prognosis for Safe Diet Advancement Good Barriers to Reach Goals -- Barriers/Prognosis Comment -- CHL IP DIET RECOMMENDATION 04/21/2016 SLP Diet Recommendations Dysphagia 3 (Mech  soft) solids;Thin liquid Liquid Administration via Cup;Straw Medication Administration Crushed with puree Compensations Slow rate;Small sips/bites;Follow solids with liquid Postural Changes Seated upright at 90 degrees   CHL IP OTHER RECOMMENDATIONS 04/21/2016 Recommended Consults Consider GI evaluation;Consider esophageal assessment Oral Care Recommendations Oral care BID Other Recommendations --   CHL IP FOLLOW UP RECOMMENDATIONS 04/21/2016 Follow up Recommendations Skilled Nursing facility;24 hour supervision/assistance   CHL IP FREQUENCY AND DURATION 04/21/2016 Speech Therapy Frequency (ACUTE ONLY) min 1 x/week Treatment Duration 1 week      CHL IP ORAL PHASE 04/21/2016 Oral Phase Impaired Oral - Pudding Teaspoon -- Oral - Pudding Cup -- Oral - Honey Teaspoon -- Oral - Honey Cup -- Oral - Nectar Teaspoon -- Oral - Nectar Cup -- Oral - Nectar Straw -- Oral - Thin Teaspoon -- Oral - Thin Cup WFL Oral - Thin Straw WFL Oral - Puree --  Oral - Mech Soft -- Oral - Regular (No Data) Oral - Multi-Consistency -- Oral - Pill -- Oral Phase - Comment --  CHL IP PHARYNGEAL PHASE 04/21/2016 Pharyngeal Phase Impaired Pharyngeal- Pudding Teaspoon -- Pharyngeal -- Pharyngeal- Pudding Cup -- Pharyngeal -- Pharyngeal- Honey Teaspoon -- Pharyngeal -- Pharyngeal- Honey Cup -- Pharyngeal -- Pharyngeal- Nectar Teaspoon -- Pharyngeal -- Pharyngeal- Nectar Cup -- Pharyngeal -- Pharyngeal- Nectar Straw -- Pharyngeal -- Pharyngeal- Thin Teaspoon -- Pharyngeal -- Pharyngeal- Thin Cup Pharyngeal residue - valleculae;Pharyngeal residue - pyriform;Reduced laryngeal elevation;Reduced epiglottic inversion Pharyngeal -- Pharyngeal- Thin Straw Pharyngeal residue - valleculae;Pharyngeal residue - pyriform;Reduced laryngeal elevation;Reduced epiglottic inversion Pharyngeal -- Pharyngeal- Puree -- Pharyngeal -- Pharyngeal- Mechanical Soft -- Pharyngeal -- Pharyngeal- Regular Pharyngeal residue - valleculae;Reduced epiglottic inversion;Reduced laryngeal elevation Pharyngeal -- Pharyngeal- Multi-consistency -- Pharyngeal -- Pharyngeal- Pill -- Pharyngeal -- Pharyngeal Comment --  CHL IP CERVICAL ESOPHAGEAL PHASE 04/21/2016 Cervical Esophageal Phase WFL Pudding Teaspoon -- Pudding Cup -- Honey Teaspoon -- Honey Cup -- Nectar Teaspoon -- Nectar Cup -- Nectar Straw -- Thin Teaspoon -- Thin Cup -- Thin Straw -- Puree -- Mechanical Soft -- Regular -- Multi-consistency -- Pill -- Cervical Esophageal Comment -- CHL IP GO 08/08/2015 Functional Assessment Tool Used mbs Functional Limitations Swallowing Swallow Current Status (Q0086) CJ Swallow Goal Status (P6195) CJ Swallow Discharge Status (K9326) CJ Motor Speech Current Status (Z1245) (None) Motor Speech Goal Status (Y0998) (None) Motor Speech Goal Status (P3825) (None) Spoken Language Comprehension Current Status (K5397) (None) Spoken Language Comprehension Goal Status (Q7341) (None) Spoken Language Comprehension Discharge Status (P3790)  (None) Spoken Language Expression Current Status (W4097) (None) Spoken Language Expression Goal Status (D5329) (None) Spoken Language Expression Discharge Status (J2426) (None) Attention Current Status (S3419) (None) Attention Goal Status (Q2229) (None) Attention Discharge Status (N9892) (None) Memory Current Status (J1941) (None) Memory Goal Status (D4081) (None) Memory Discharge Status (K4818) (None) Voice Current Status (H6314) (None) Voice Goal Status (H7026) (None) Voice Discharge Status (V7858) (None) Other Speech-Language Pathology Functional Limitation Current Status (I5027) (None) Other Speech-Language Pathology Functional Limitation Goal Status (X4128) (None) Other Speech-Language Pathology Functional Limitation Discharge Status [phone]) (None) [place] Siren 04/21/2016, 2:50 PM Orbie Pyo Litaker M.Ed CCC-SLP Pager [phone]                CBC  Recent Labs Lab 04/23/16 0401 04/24/16 0424 04/25/16 0521 04/26/16 0604 04/27/16 0756  WBC 7.3 8.3 7.4 8.5 6.6  HGB 13.5 12.4 12.1 10.6* 9.9*  HCT 42.4 38.3 37.2 33.3* 30.7*  PLT 472* 434* 423* 419* 383  MCV 111.3* 111.0* 112.4* 111.7* 112.9*  MCH 35.4* 35.9* 36.6*  35.6* 36.4*  MCHC 31.8 32.4 32.5 31.8 32.2  RDW 14.5 14.7 14.9 14.4 14.6    Chemistries   Recent Labs Lab 04/21/16 0614 04/21/16 1453 04/23/16 0401 04/24/16 0424 04/25/16 0521  NA 139 142 142 144 143  K 6.1* 4.2 2.3* 3.3* 4.0  CL 107 106 104 107 108  CO2 22 24 28 24 27   GLUCOSE 74 110* 102* 98 82  BUN 8 5* 5* 7 5*  CREATININE 0.43* 0.42* 0.55 0.38* 0.34*  CALCIUM 8.9 8.9 8.7* 8.9 8.7*   ------------------------------------------------------------------------------------------------------------------ estimated creatinine clearance is 49.5 mL/min (A) (by C-G formula based on SCr of 0.34 mg/dL (L)). ------------------------------------------------------------------------------------------------------------------ No results for input(s): HGBA1C in the last 72  hours. ------------------------------------------------------------------------------------------------------------------ No results for input(s): CHOL, HDL, LDLCALC, TRIG, CHOLHDL, LDLDIRECT in the last 72 hours. ------------------------------------------------------------------------------------------------------------------ No results for input(s): TSH, T4TOTAL, T3FREE, THYROIDAB in the last 72 hours.  Invalid input(s): FREET3 ------------------------------------------------------------------------------------------------------------------ No results for input(s): VITAMINB12, FOLATE, FERRITIN, TIBC, IRON, RETICCTPCT in the last 72 hours.  Coagulation profile No results for input(s): INR, PROTIME in the last 168 hours.  No results for input(s): DDIMER in the last 72 hours.  Cardiac Enzymes No results for input(s): CKMB, TROPONINI, MYOGLOBIN in the last 168 hours.  Invalid input(s): CK ------------------------------------------------------------------------------------------------------------------ Invalid input(s): POCBNP   CBG: No results for input(s): GLUCAP in the last 168 hours.     Studies: No results found.    Lab Results  Component Value Date   HGBA1C 5.4 03/09/2014   Lab Results  Component Value Date   LDLCALC 53 03/09/2014   CREATININE 0.34 (L) 04/25/2016       Scheduled Meds: . chlorhexidine  15 mL Mouth Rinse BID  . diclofenac sodium  2 g Topical QID  . enoxaparin (LOVENOX) injection  80 mg Subcutaneous Q12H  . guaiFENesin  600 mg Oral BID  . hydroxyurea  500 mg Oral Daily  . ipratropium-albuterol  3 mL Nebulization TID  . mouth rinse  15 mL Mouth Rinse q12n4p  . metoprolol  2.5 mg Intravenous Q6H   Continuous Infusions: . piperacillin-tazobactam (ZOSYN)  IV       LOS: 7 days    Time spent: >35 MINS    Charlis Harner  [facility] Page via [email or web address], password [email or web address]  If 7PM-7AM, please contact night-coverage at [email or web address],  password [facility] 04/27/2016, 1:07 PM  LOS: 7 days

## 2016-04-27 NOTE — Progress Notes (Deleted)
[facility] Progress Note     Subjective: Sandy Barrett is a known patient to the [facility], seen by Dr. Armandina Gemma last year for her goiter. Patient has a medical history of large MCA stoke with right hemiplegia, dysphagia, aphasia, hyperthyroidism with a multinodular goiter, thrombocytosis. and hypertension. Per family request, general surgery is asked to see for a second opinion regarding surgical intervention for patients goiter. Family and patient both feel that, while dysphagia originated due to CVA, it is exacerbated by patients goiter and they want to know if surgical intervention may provide symptomatic relief. The patient has be deemed a poor surgical candidate in the past.    Objective: Vital signs in last 24 hours: Temp:  [98.1 F (36.7 C)-99.3 F (37.4 C)] 99.3 F (37.4 C) (04/23 4098) Pulse Rate:  [98-100] 98 (04/23 0613) Resp:  [18] 18 (04/23 0613) BP: (92-115)/(67-77) 115/74 (04/23 0613) SpO2:  [98 %-100 %] 99 % (04/23 1191) Weight:  [73.8 kg (162 lb 12.8 oz)] 73.8 kg (162 lb 12.8 oz) (04/23 0500) Last BM Date: 04/22/16  Intake/Output from previous day: No intake/output data recorded. Intake/Output this shift: No intake/output data recorded.  PE: Gen:  Alert, NAD, pleasant Eyes: EOM's in tact Neck: palpable goiter  Card:  Regular rate and rhythm Pulm:  Non-labored, clear to auscultation bilaterally Abd: Soft, obese, bowel sounds present Ext:  No erythema, edema, or tenderness  Lab Results:   Recent Labs  04/25/16 0521 04/26/16 0604  WBC 7.4 8.5  HGB 12.1 10.6*  HCT 37.2 33.3*  PLT 423* 419*   BMET  Recent Labs  04/25/16 0521  NA 143  K 4.0  CL 108  CO2 27  GLUCOSE 82  BUN 5*  CREATININE 0.34*  CALCIUM 8.7*   PT/INR No results for input(s): LABPROT, INR in the last 72 hours. CMP     Component Value Date/Time   NA 143 04/25/2016 0521   K 4.0 04/25/2016 0521   CL 108 04/25/2016 0521   CO2 27 04/25/2016 0521   GLUCOSE 82  04/25/2016 0521   BUN 5 (L) 04/25/2016 0521   CREATININE 0.34 (L) 04/25/2016 0521   CALCIUM 8.7 (L) 04/25/2016 0521   PROT 6.6 01/26/2016 2115   ALBUMIN 3.4 (L) 01/26/2016 2115   AST 16 01/26/2016 2115   ALT 13 (L) 01/26/2016 2115   ALKPHOS 61 01/26/2016 2115   BILITOT 0.5 01/26/2016 2115   GFRNONAA >60 04/25/2016 0521   GFRAA >60 04/25/2016 0521   Lipase     Component Value Date/Time   LIPASE 31 06/27/2015 0454       Studies/Results: No results found.  Anti-infectives: Anti-infectives    Start     Dose/Rate Route Frequency Ordered Stop   04/20/16 2359  piperacillin-tazobactam (ZOSYN) IVPB 3.375 g     3.375 g 12.5 mL/hr over 240 Minutes Intravenous Every 8 hours 04/20/16 2341     04/20/16 1230  piperacillin-tazobactam (ZOSYN) IVPB 3.375 g     3.375 g 100 mL/hr over 30 Minutes Intravenous  Once 04/20/16 1227 04/20/16 1713     Assessment/Plan Multinodular goiter  - CT scan with slight interval growth of goiter, no significant changed compared to 2017  - patient's age, respiratory status, and history of CVA in 2016 make her a poor surgical candidate, as determined by Dr. Harlow Asa in the past. After seeing the patient and reviewing her chart I do not recommend thyroidectomy as this time, I believe this risks of surgery outweigh the possible  symptomatic relief to the patient.  - Patients thyroid disease currently managed by Dr. Loanne Drilling and, according to primary teams note, he agrees that due to patients current state of health she may not tolerate isolation and radioactive iodine therapy at this time, but plans to follow up with patient after she is discharged from the hospital to discuss this further.    Left MCA stroke with resulting right hemiplegia, aphasia, dysphagia (2016) HTN Hx PE (2016) - on eliquis     LOS: 7 days    Jill Alexanders , [facility] 04/27/2016, 8:13 AM Pager: [phone] Consults: [phone] Mon-Fri 7:00 am-4:30  pm Sat-Sun 7:00 am-11:30 am

## 2016-04-27 NOTE — Progress Notes (Signed)
Pt has an upper airway crs/rh

## 2016-04-27 NOTE — Progress Notes (Signed)
ANTICOAGULATION CONSULT NOTE - Follow Up Consult  Pharmacy Consult for Apixaban Indication: history of pulmonary embolus (March 2016)  No Known Allergies  Patient Measurements: Weight: 162 lb 12.8 oz (73.8 kg)  Vital Signs: Temp: 99.3 F (37.4 C) (04/23 0613) Temp Source: Oral (04/23 1610) BP: 116/75 (04/23 1256) Pulse Rate: 98 (04/23 1256)  Labs:  Recent Labs  04/25/16 0521 04/26/16 0604 04/27/16 0756  HGB 12.1 10.6* 9.9*  HCT 37.2 33.3* 30.7*  PLT 423* 419* 383  CREATININE 0.34*  --   --     Estimated Creatinine Clearance: 49.5 mL/min (A) (by C-G formula based on SCr of 0.34 mg/dL (L)).   Medications:  Prescriptions Prior to Admission  Medication Sig Dispense Refill Last Dose  . acetaminophen (TYLENOL) 160 MG/5ML liquid Take 480 mg by mouth every 4 (four) hours as needed for fever or pain.   04/19/2016 at Unknown time  . albuterol (PROVENTIL HFA) 108 (90 Base) MCG/ACT inhaler Inhale 2 puffs into the lungs every 6 (six) hours as needed for wheezing or shortness of breath. 1 Inhaler 1 04/19/2016 at Unknown time  . amoxicillin-clavulanate (AUGMENTIN) 875-125 MG tablet Take 1 tablet by mouth 2 (two) times daily. 20 tablet 0 04/19/2016 at Unknown time  . apixaban (ELIQUIS) 2.5 MG TABS tablet Take 1 tablet (2.5 mg total) by mouth 2 (two) times daily. 60 tablet 2 04/19/2016 at 2000  . cephALEXin (KEFLEX) 250 MG capsule Take 250 mg by mouth daily. UTI Prevention   04/12/2016  . diclofenac sodium (VOLTAREN) 1 % GEL APPLY (2GMS) TOPICALLY THREE TIMES DAILY. 300 g 5 04/19/2016 at Unknown time  . fluconazole (DIFLUCAN) 150 MG tablet Take 150 mg by mouth as needed.    over a month  . gabapentin (NEURONTIN) 100 MG capsule Take 1 capsule (100 mg total) by mouth 3 (three) times daily. (Patient taking differently: Take 100 mg by mouth 2 (two) times daily. ) 90 capsule 5 04/19/2016 at Unknown time  . hydroxyurea (HYDREA) 500 MG capsule Take 2 capsules (1,000 mg total) by mouth daily. May take  with food to minimize GI side effects. (Patient taking differently: Take 500 mg by mouth daily. ) 60 capsule 9 04/19/2016 at Unknown time  . methimazole (TAPAZOLE) 5 MG tablet TAKE 1 TABLET BY MOUTH TWICE WEEKLY (Patient taking differently: TAKE 1 TABLET BY MOUTH TWICE WEEKLY (Sun and Thurs)) 12 tablet 2 04/19/2016 at Unknown time  . polyethylene glycol (MIRALAX / GLYCOLAX) packet TAKE 17G BY MOUTH TWICE A DAY (Patient taking differently: TAKE 17G BY MOUTH TWICE A DAY as needed) 14 packet 1 Past Month at Unknown time  . Probiotic Product (ALIGN) 4 MG CAPS Take 1 capsule by mouth daily.    04/19/2016 at Unknown time  . propranolol (INDERAL) 10 MG tablet TAKE 1 TABLET TWICE A DAY (Patient taking differently: TAKE 1 TABLET once A DAY) 60 tablet 5 04/19/2016 at 1000  . PROTONIX 40 MG PACK TAKE 20 MLS (40 MG TOTAL) BY MOUTH DAILY. (Patient taking differently: Take 40 mg by mouth daily. Mix with applesauce) 30 each 5 04/19/2016 at Unknown time  . terconazole (TERAZOL 7) 0.4 % vaginal cream Place 1 applicator vaginally at bedtime. (Patient taking differently: Place 1 applicator vaginally at bedtime as needed (irritation). ) 90 g 3 Past Month at Unknown time  . traMADol (ULTRAM) 50 MG tablet TAKE 1 TO 2 TABLETS BY MOUTH 3 TIMES A DAY AS NEEDED (Patient taking differently: TAKE 2 TABLETS BY MOUTH 3 TIMES A DAY  AS NEEDED) 180 tablet 1 04/19/2016 at Unknown time  . traZODone (DESYREL) 50 MG tablet TAKE 1 TABLET (50 MG TOTAL) BY MOUTH AT BEDTIME AS NEEDED. FOR SLEEP 30 tablet 5 04/19/2016 at Unknown time    Assessment: 83 yo F admitted 4/16 with increased cough and difficulty swallowing.  Apixaban was held at that time and pt transitioned to Lovenox due to difficulty swallowing.  Pt has been seen by SLP who has advanced diet.  Pt now able to take oral medications.  MD has asked that apixaban be restarted.   Of note, PTA pt was on apixaban 2.5mg  BID dose.  This is appropriate dosing for DVT/PE prevention if clot was > 6  months ago.  Goal of Therapy:  Therapeutic anticoagulation Monitor platelets by anticoagulation protocol: Yes   Plan:  Apixaban 2.5 mg PO BID - first dose tonight.  [employer], Pharm.D., BCPS Clinical Pharmacist Pager: [phone] Clinical phone for 04/27/2016 from 8:30-4:00 is [phone]. After 4pm, please call Main [facility] [phone]) for assistance. 04/27/2016 2:06 PM

## 2016-04-28 ENCOUNTER — Other Ambulatory Visit: Payer: Self-pay | Admitting: Internal Medicine

## 2016-04-28 DIAGNOSIS — Z515 Encounter for palliative care: Secondary | ICD-10-CM

## 2016-04-28 DIAGNOSIS — Z7189 Other specified counseling: Secondary | ICD-10-CM

## 2016-04-28 LAB — CBC
HCT: 33.7 % — ABNORMAL LOW (ref 36.0–46.0)
Hemoglobin: 10.5 g/dL — ABNORMAL LOW (ref 12.0–15.0)
MCH: 35.1 pg — ABNORMAL HIGH (ref 26.0–34.0)
MCHC: 31.2 g/dL (ref 30.0–36.0)
MCV: 112.7 fL — ABNORMAL HIGH (ref 78.0–100.0)
Platelets: 421 10*3/uL — ABNORMAL HIGH (ref 150–400)
RBC: 2.99 MIL/uL — ABNORMAL LOW (ref 3.87–5.11)
RDW: 14.2 % (ref 11.5–15.5)
WBC: 8 10*3/uL (ref 4.0–10.5)

## 2016-04-28 MED ORDER — AMOXICILLIN-POT CLAVULANATE 875-125 MG PO TABS
1.0000 | ORAL_TABLET | Freq: Two times a day (BID) | ORAL | Status: DC
  Administered 2016-04-28 – 2016-04-29 (×2): 1 via ORAL
  Filled 2016-04-28 (×2): qty 1

## 2016-04-28 MED ORDER — GABAPENTIN 100 MG PO CAPS
100.0000 mg | ORAL_CAPSULE | Freq: Every day | ORAL | Status: DC
Start: 2016-04-28 — End: 2016-07-03

## 2016-04-28 MED ORDER — AMOXICILLIN-POT CLAVULANATE 875-125 MG PO TABS: 1.0000 | ORAL_TABLET | Freq: Two times a day (BID) | ORAL | 0 refills | Status: DC

## 2016-04-28 NOTE — Progress Notes (Signed)
[facility] Liaison:  RN visit  Notified by Dewain Penning, of patient/family request for Hospice and [place] services at home after discharge.    Met with patient and her daughters.  PCG, [name], advised that they would have to decline hospice services at this time because they would like to continue getting rehab for swallowing/eating and want to continue receiving Botox for R arm/Cortisone shots for knees.  All of which are not covered under Hospice plan of care.  I did confirm with Dr. Tomasa Hosteller.    Very pleasant family interaction.  They advised that they have our numbers, as I provided them with brochure and numbers.  They advised further they would like to see how their mother does at home when she gets back and they know that they will need [place] one day, just not at this time.  Levada Dy, [facility], aware of same.   Thank you for the referral,  Edyth Gunnels, RN, [facility] Liaison [phone]  All hospital liaison's are now on [place].

## 2016-04-28 NOTE — Discharge Summary (Signed)
Physician Discharge Summary  [name] HUT:[id number] DOB: Jul 30, 1932 DOA: 04/20/2016  PCP: [name], MD  Admit date: 04/20/2016 Discharge date: 04/28/2016  Time spent: 35 minutes  Recommendations for Outpatient Follow-up:  1. PCP Dr.Burns in 1 week 2. Endocrine Dr.Ellison in 1 week 3. Hospice services recommended at discharge   Discharge Diagnoses:  Principal Problem:   Aspiration PNA (pneumonia) Active Problems:   Hyperthyroidism   Essential hypertension   Aphasia S/P CVA   Spastic hemiparesis affecting dominant side (HCC), right   Dysphagia   Aspiration pneumonia (HCC)   Thyroid mass   Palliative care by specialist   Pressure injury of skin   Palliative care encounter   Encounter for hospice care discussion   Discharge Condition: improved  Diet recommendation: dysphagia 3 with thin liquids  Filed Weights   04/26/16 0500 04/27/16 0500 04/28/16 0300  Weight: 75.9 kg (167 lb 5.3 oz) 73.8 kg (162 lb 12.8 oz) 73.7 kg (162 lb 6.4 oz)    History of present illness:  83 y.o.femalewith medical history significant for 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 Found to have aspiration pneumonia and dysphagia  Hospital Course:  Aspiration pneumonia -has multifactorial dysphagia -due to advanced age, Debility, large MCA stroke in 2016 and large multinodular goitre is definitley not helping, on esophagram it is causing marked displacement of esophagus, and smooth stricture of the upper esophagus at level of thoracic inlet is likely due to external compression by substernal goiter -s/p SLP evaluation, recommended D3 diet now -treated with IV Zosyn  x8days then change to PO Augmentin for 2days to complete 10days of abx, nebs for pulm toilet,  NT suctioning PRN etc -see discussion on goitre below -I discussed the need for Palliative care discussions and limited options with dysphagia/aspiration pneumonia and limited options for multinodular goitre as discussed below -Palliative consulted for Goals of care, s/p family meeting, DNR, discussed need for consideration for Hospice. -stable at discharge, but always at risk if aspirating  Multinodular goitre -Repeat CT soft tissue neck,  shows only slight interval growth of large goiter, no significant change compared to 2017 she is not a good surgical candidate she has already seen Surgery[facility] Dr.Gerkin in 2017, I called and d/w Dr.Sean Loanne Drilling and he felt that due to her debilitated status she may not be able to manage isolation for 5days and radioactive iodine at this time, plan for outpatient FU with Dr.Ellison next week to decide on radio-active iodine option. -Last TSH and freeT4 on 04/01/16 were normal on current dose of Methimazole, resumed methimazole -family requested second surgical opinion for thyroidectomy despite being told numerous times that she is a very poor surgical candidate given her tenuous resp status, due to persistent family request asked [facility] for input, felt to be a poor surgical candidate by [facility] again, Dr.Cornett suggested ENT eval and hence ENT consult requested per family insistence, seen by Dr.Rosen today who felt that surgery would be very risky hence not recommended  Hyperkalemia -s/p one dose of Kayexalate -resolved  Hypertension -Currently controlled, stable -resumed propranolol  Large MCA stroke with Spastic hemiparesis (right sided), dysphagia and aphasia - bed/wheel chair bound at baseline -and h/o DVT and PE in 2016. -on eliquis at home,  Was briefly on lovenox , restarted home  eliquis  H/o  PE on eliquis at home -resumed eliquis  Thrombocytosis  Followed by Dr Waldon Merl -resumed hydroxyurea   Consultations:  Palliative medicine  Gen  surgery  ENT Dr.ROsen  Discharge Exam: Vitals:   04/28/16 0520 04/28/16 1006  BP: 108/70 114/60  Pulse: 91 91  Resp: 20   Temp: 98.8 F (37.1 C)     General: alert, awake, aphasic Cardiovascular: S1S2/RRR Respiratory: few basilar ronchi  Discharge Instructions    Current Discharge Medication List    START taking these medications   Details  gabapentin (NEURONTIN) 100 MG capsule Take 1 capsule (100 mg total) by mouth at bedtime.      CONTINUE these medications which have CHANGED   Details  amoxicillin-clavulanate (AUGMENTIN) 875-125 MG tablet Take 1 tablet by mouth 2 (two) times daily. For 1days Qty: 2 tablet, Refills: 0   Associated Diagnoses: Community acquired pneumonia, unspecified laterality      CONTINUE these medications which have NOT CHANGED   Details  acetaminophen (TYLENOL) 160 MG/5ML liquid Take 480 mg by mouth every 4 (four) hours as needed for fever or pain.    albuterol (PROVENTIL HFA) 108 (90 Base) MCG/ACT inhaler Inhale 2 puffs into the lungs every 6 (six) hours as needed for wheezing or shortness of breath. Qty: 1 Inhaler, Refills: 1   Associated Diagnoses: Cough    apixaban (ELIQUIS) 2.5 MG TABS tablet Take 1 tablet (2.5 mg total) by mouth 2 (two) times daily. Qty: 60 tablet, Refills: 2    diclofenac sodium (VOLTAREN) 1 % GEL APPLY (2GMS) TOPICALLY THREE TIMES DAILY. Qty: 300 g, Refills: 5    hydroxyurea (HYDREA) 500 MG capsule Take 2 capsules (1,000 mg total) by mouth daily. May take with food to minimize GI side effects. Qty: 60 capsule, Refills: 9    methimazole (TAPAZOLE) 5 MG tablet TAKE 1 TABLET BY MOUTH TWICE WEEKLY Qty: 12 tablet, Refills: 2    polyethylene glycol (MIRALAX / GLYCOLAX) packet TAKE 17G BY MOUTH TWICE A DAY Qty: 14 packet, Refills: 1    Probiotic Product (ALIGN) 4 MG CAPS Take 1 capsule by mouth daily.     propranolol (INDERAL) 10 MG tablet TAKE 1 TABLET TWICE A DAY Qty: 60 tablet, Refills: 5    PROTONIX 40 MG  PACK TAKE 20 MLS (40 MG TOTAL) BY MOUTH DAILY. Qty: 30 each, Refills: 5    terconazole (TERAZOL 7) 0.4 % vaginal cream Place 1 applicator vaginally at bedtime. Qty: 90 g, Refills: 3    traMADol (ULTRAM) 50 MG tablet TAKE 1 TO 2 TABLETS BY MOUTH 3 TIMES A DAY AS NEEDED Qty: 180 tablet, Refills: 1    traZODone (DESYREL) 50 MG tablet TAKE 1 TABLET (50 MG TOTAL) BY MOUTH AT BEDTIME AS NEEDED. FOR SLEEP Qty: 30 tablet, Refills: 5      STOP taking these medications     cephALEXin (KEFLEX) 250 MG capsule      fluconazole (DIFLUCAN) 150 MG tablet        No Known Allergies Follow-up Information    [name], MD. Schedule an appointment as soon as possible for a visit in 1 week(s).   Specialty:  Internal Medicine Contact information: [place] [address] [phone]        Renato Shin, MD. Schedule an appointment as soon as possible for a visit in 1 week(s).   Specialty:  Endocrinology Why:  to discuss radio-active iodine Contact information: [address] [place]  [place] [address] [phone]            The results of significant diagnostics from this hospitalization (including imaging, microbiology, ancillary and laboratory) are listed below for reference.    Significant Diagnostic Studies: Dg Chest 1 View  Result Date: 04/14/2016 CLINICAL DATA:  Chest congestion, cough, dysphagia for the past 5 days. History previous CVA, pulmonary embolism. Patient unable to raise the chin above the chest. EXAM: CHEST 1 VIEW COMPARISON:  Portable chest x-ray of May 31, 2015 and chest CT scan of May 31, 2015. FINDINGS: Again demonstrated is a large soft tissue mass which deviates the trachea to the right most compatible with a goiter which is been demonstrated on previous chest x-ray and CT scan. The lungs are well-expanded. There are stable calcifications in the left upper hemithorax which lie in the soft tissues superficial to the scapula on the  previous CT scan. The cardiac silhouette remains enlarged. The pulmonary vascularity is not engorged. There is calcification in the wall of the aortic arch. There is degenerative change of both shoulders. IMPRESSION: No acute pneumonia nor pulmonary edema.  Stable mild cardiomegaly. Thoracic aortic atherosclerosis. Large goiter with deviation of the trachea toward the right with narrowing of the tracheal lumen. This has progressed somewhat since the previous study. Electronically Signed   By: David  Martinique M.D.   On: 04/14/2016 08:28   Dg Chest 2 View  Result Date: 04/22/2016 CLINICAL DATA:  Acute onset of cough.  Initial encounter. EXAM: CHEST  2 VIEW COMPARISON:  Chest radiograph performed 04/20/2016 FINDINGS: The lungs are well-aerated. Right perihilar opacity is new from the study performed 2 days ago, and may reflect pneumonia. There is no evidence of pleural effusion or pneumothorax. Underlying vascular congestion is noted. As before, a very large thyroid goiter displaces the trachea to the right, with tracheal narrowing. This finding is stable from 2012. The heart is mildly enlarged. No acute osseous abnormalities are seen. Degenerative change is noted at the glenohumeral joints. IMPRESSION: 1. Right perihilar opacity is new from the study performed 2 days ago, and may reflect pneumonia. Underlying vascular congestion and mild cardiomegaly. 2. Very large thyroid goiter again noted, displacing the trachea to the right, with tracheal narrowing. This is stable from 2012. Electronically Signed   By: Garald Balding M.D.   On: 04/22/2016 19:47   Dg Chest 2 View  Result Date: 04/20/2016 CLINICAL DATA:  Cough and shortness of breath. EXAM: CHEST  2 VIEW COMPARISON:  04/13/2016 and chest CT on 05/31/2015 FINDINGS: Heart size is stable. Large mediastinal mass resulting in tracheal narrowing and deviation to the right is stable, and consistent with large substernal goiter, as demonstrated on previous CT. No  evidence of pulmonary infiltrate or edema. Limited visualization on lateral projection due to position of patient's arms. IMPRESSION: No acute findings. Stable large substernal goiter causing tracheal narrowing and deviation to the right. Electronically Signed   By: Earle Gell M.D.   On: 04/20/2016 10:13   Ct Soft Tissue Neck W Contrast  Result Date: 04/21/2016 CLINICAL DATA:  Continued surveillance multinodular goiter. EXAM: CT NECK WITH CONTRAST TECHNIQUE: Multidetector CT imaging of the neck was performed using the standard protocol following the bolus administration of intravenous contrast. CONTRAST:  4mL ISOVUE-300 IOPAMIDOL (ISOVUE-300) INJECTION 61% COMPARISON:  07/01/2015. FINDINGS: Pharynx and larynx: Normal pharyngeal structures. Severe rightward displacement of the proximal trachea and larynx due to the leftward dominant multinodular goiter. This may be increased from prior of June 2017. Salivary glands:  No inflammation, mass, or stone. Thyroid: Markedly enlarged and heterogeneous thyroid, with multiple nodules and scattered calcifications, significant substernal extent. In addition to mass effect on the trachea, the SVC is displaced and effaced. Approximate measurements are 97 x 45 x 68 mm, although the entire craniocaudal extent is not assessed on this CT neck study. Lymph nodes: None enlarged or abnormal density. Vascular: Occluded LEFT ICA. Calcifications in the LEFT internal jugular are redemonstrated consistent with chronic thrombosis. Limited intracranial: LEFT temporal lobe encephalomalacia better visualized on previous exam. Visualized orbits: Not visualized. Mastoids and visualized paranasal sinuses: Clear. Skeleton: No acute osseous findings. Cervical spondylosis. Nuchal ligament ossification. Upper chest: No pulmonary nodule or pneumothorax. Other: None. IMPRESSION: Slight interval growth since 2017 of large goiter. Significant LEFT-to-RIGHT mass effect on the trachea and SVC; see  discussion above. Electronically Signed   By: Staci Righter M.D.   On: 04/21/2016 13:19   Dg Esophagus  Result Date: 04/23/2016 CLINICAL DATA:  Dysphagia.  Substernal goiter. EXAM: ESOPHOGRAM/BARIUM SWALLOW TECHNIQUE: Single contrast examination was performed using  thin barium. FLUOROSCOPY TIME:  Fluoroscopy Time: 1 minutes 48 seconds ; low dose pulsed fluoroscopy Radiation Exposure Index (if provided by the fluoroscopic device): 15.6 mGy Number of Acquired Spot Images: 0 COMPARISON:  None. FINDINGS: Technically difficult exam as patient could not drink from cup or straw, and thin barium was administered orally by syringe. The patient could only take small swallows of barium. A large substernal goiter is seen causing marked displacement of the esophagus to the right. A smooth stricture of the upper esophagus is seen at level thoracic inlet, likely due to external compression by substernal goiter. This does not obstruct the passage of thin barium. No evidence of distal esophageal stricture or hiatal hernia. Lack of primary peristalsis resulting in esophageal stasis. IMPRESSION: Technically difficult exam. Large substernal goiter causes marked displacement of esophagus to the right. Smooth stricture of the upper esophagus at level of thoracic inlet is likely due to external compression by substernal goiter. Electronically Signed   By: Earle Gell M.D.   On: 04/23/2016 14:45   Dg Swallowing Func-speech Pathology  Result Date: 04/21/2016 Objective Swallowing Evaluation: Type of Study: MBS-Modified Barium Swallow Study Patient Details Name: Tiffeny Minchew MRN: [id number] Date of Birth: 1932-11-04 Today's Date: 04/21/2016 Time: SLP Start Time (ACUTE ONLY): 1039-SLP Stop Time (ACUTE ONLY): 1053 SLP Time Calculation (min) (ACUTE ONLY): 14 min Past Medical History: Past Medical History: Diagnosis Date . ARTHRITIS  . Arthritis  . Diverticulosis  . HYPERTENSION  . HYPERTHYROIDISM  . Hyperthyroidism   s/p I-131 ablation  03/2011 of multinod goiter . INSOMNIA  . MIGRAINE HEADACHE  . OBESITY  . Posttraumatic stress disorder  . Pulmonary embolism ([facility]) 03/2014  with DVT . Stroke [facility]) 03/2014  dysarthria Past Surgical History: Past Surgical History: Procedure Laterality Date . ABDOMINAL HYSTERECTOMY  1976 . BREAST SURGERY    biopsy . RADIOLOGY WITH ANESTHESIA Left 03/08/2014  Procedure: RADIOLOGY WITH ANESTHESIA;  Surgeon: Rob Hickman, MD;  Location: [facility];  Service: Radiology;  Laterality: Left; . TONSILLECTOMY   . TOTAL HIP ARTHROPLASTY  1998   right HPI: Ptis a 83 y.o.femalewith medical history significant of hyperthyroidism, HTN, migraine headache, left MCA stroke with residual right sided weakness and aphasia, and PE who presented to the ED with c/o cough and wheezing. Patient seen by PCP on 04/13/2016 and diagnosed with CAP and UTI. CXR showed no acute findings, stable large substernal goiter causing tracheal narrowing and deviation to the  right. No Data Recorded Assessment / Plan / Recommendation CHL IP CLINICAL IMPRESSIONS 04/21/2016 Clinical Impression Ms. Darsey presents with a mild oral/pharyngeal dysphagia, suspect primary esophageal dysphagia. Oral phase impairments included mild prolonged mastication with regular solids (pt wears partial dentures). Observed mild vallecular and pyriform sinus residue with thin liquids via cup/straw due to reduced laryngeal elevation and epiglottic inversion. MBS does not diagnose below the level of the UES however scan of esophagus revealed stasis within the mid-esophagus and questionable stenosis. Pt previously diagnosed with a substernal goiter causing tracheal narrowing and deviation to the right possible causing observations. Educated pt re: results of MBS, diet recommendations, esophageal precautions (alternate solids/liquids, sit upright after meals), and advised pt to consider GI consult due to esophageal findings. Recommend Dys 3 solids, thin liquids (straws ok), meds crushed.  ST will f/u briefly to assess diet tolerance and for continued education. SLP Visit Diagnosis Dysphagia, pharyngoesophageal phase (R13.14) Attention and concentration deficit following -- Frontal lobe and executive function deficit following -- Impact on safety and function Mild aspiration risk   CHL IP TREATMENT RECOMMENDATION 04/21/2016 Treatment Recommendations Therapy as outlined in treatment plan below   Prognosis 04/21/2016 Prognosis for Safe Diet Advancement Good Barriers to Reach Goals -- Barriers/Prognosis Comment -- CHL IP DIET RECOMMENDATION 04/21/2016 SLP Diet Recommendations Dysphagia 3 (Mech soft) solids;Thin liquid Liquid Administration via Cup;Straw Medication Administration Crushed with puree Compensations Slow rate;Small sips/bites;Follow solids with liquid Postural Changes Seated upright at 90 degrees   CHL IP OTHER RECOMMENDATIONS 04/21/2016 Recommended Consults Consider GI evaluation;Consider esophageal assessment Oral Care Recommendations Oral care BID Other Recommendations --   CHL IP FOLLOW UP RECOMMENDATIONS 04/21/2016 Follow up Recommendations Skilled Nursing facility;24 hour supervision/assistance   CHL IP FREQUENCY AND DURATION 04/21/2016 Speech Therapy Frequency (ACUTE ONLY) min 1 x/week Treatment Duration 1 week      CHL IP ORAL PHASE 04/21/2016 Oral Phase Impaired Oral - Pudding Teaspoon -- Oral - Pudding Cup -- Oral - Honey Teaspoon -- Oral - Honey Cup -- Oral - Nectar Teaspoon -- Oral - Nectar Cup -- Oral - Nectar Straw -- Oral - Thin Teaspoon -- Oral - Thin Cup WFL Oral - Thin Straw WFL Oral - Puree -- Oral - Mech Soft -- Oral - Regular (No Data) Oral - Multi-Consistency -- Oral - Pill -- Oral Phase - Comment --  CHL IP PHARYNGEAL PHASE 04/21/2016 Pharyngeal Phase Impaired Pharyngeal- Pudding Teaspoon -- Pharyngeal -- Pharyngeal- Pudding Cup -- Pharyngeal -- Pharyngeal- Honey Teaspoon -- Pharyngeal -- Pharyngeal- Honey Cup -- Pharyngeal -- Pharyngeal- Nectar Teaspoon -- Pharyngeal --  Pharyngeal- Nectar Cup -- Pharyngeal -- Pharyngeal- Nectar Straw -- Pharyngeal -- Pharyngeal- Thin Teaspoon -- Pharyngeal -- Pharyngeal- Thin Cup Pharyngeal residue - valleculae;Pharyngeal residue - pyriform;Reduced laryngeal elevation;Reduced epiglottic inversion Pharyngeal -- Pharyngeal- Thin Straw Pharyngeal residue - valleculae;Pharyngeal residue - pyriform;Reduced laryngeal elevation;Reduced epiglottic inversion Pharyngeal -- Pharyngeal- Puree -- Pharyngeal -- Pharyngeal- Mechanical Soft -- Pharyngeal -- Pharyngeal- Regular Pharyngeal residue - valleculae;Reduced epiglottic inversion;Reduced laryngeal elevation Pharyngeal -- Pharyngeal- Multi-consistency -- Pharyngeal -- Pharyngeal- Pill -- Pharyngeal -- Pharyngeal Comment --  CHL IP CERVICAL ESOPHAGEAL PHASE 04/21/2016 Cervical Esophageal Phase WFL Pudding Teaspoon -- Pudding Cup -- Honey Teaspoon -- Honey Cup -- Nectar Teaspoon -- Nectar Cup -- Nectar Straw -- Thin Teaspoon -- Thin Cup -- Thin Straw -- Puree -- Mechanical Soft -- Regular -- Multi-consistency -- Pill -- Cervical Esophageal Comment -- CHL IP GO 08/08/2015 Functional Assessment Tool Used mbs Functional Limitations Swallowing Swallow Current Status (R4854) CJ  Swallow Goal Status [phone]) [place] Swallow Discharge Status [phone]) CJ Motor Speech Current Status [phone]) (None) Motor Speech Goal Status [phone]) (None) Motor Speech Goal Status [phone]) (None) Spoken Language Comprehension Current Status [phone]) (None) Spoken Language Comprehension Goal Status (X3818) (None) Spoken Language Comprehension Discharge Status [phone]) (None) Spoken Language Expression Current Status [phone]) (None) Spoken Language Expression Goal Status [phone]) (None) Spoken Language Expression Discharge Status [phone]) (None) Attention Current Status (W2585) (None) Attention Goal Status (I7782) (None) Attention Discharge Status [phone]) (None) Memory Current Status (I1443) (None) Memory Goal Status (X5400) (None) Memory Discharge Status  (Q6761) (None) Voice Current Status (P5093) (None) Voice Goal Status (O6712) (None) Voice Discharge Status (W5809) (None) Other Speech-Language Pathology Functional Limitation Current Status (X8338) (None) Other Speech-Language Pathology Functional Limitation Goal Status (S5053) (None) Other Speech-Language Pathology Functional Limitation Discharge Status [phone]) (None) [place] Siren 04/21/2016, 2:50 PM Orbie Pyo Litaker M.Ed [employer] [phone]               Microbiology: Recent Results (from the past 240 hour(s))  Culture, blood (routine x 2) Call MD if unable to obtain prior to antibiotics being given     Status: None   Collection Time: 04/20/16  7:35 PM  Result Value Ref Range Status   Specimen Description BLOOD LEFT ARM  Final   Special Requests IN PEDIATRIC BOTTLE BCAV  Final   Culture NO GROWTH 5 DAYS  Final   Report Status 04/25/2016 FINAL  Final  Culture, blood (routine x 2) Call MD if unable to obtain prior to antibiotics being given     Status: Abnormal   Collection Time: 04/20/16  7:37 PM  Result Value Ref Range Status   Specimen Description BLOOD RIGHT HAND  Final   Special Requests IN PEDIATRIC BOTTLE Blood Culture adequate volume  Final   Culture  Setup Time   Final    GRAM POSITIVE COCCI IN CLUSTERS IN PEDIATRIC BOTTLE CRITICAL RESULT CALLED TO, READ BACK BY AND VERIFIED WITH: CARON AMEND,PHARMD @0025  04/22/16 MKELLY,MLT    Culture (A)  Final    STAPHYLOCOCCUS SPECIES (COAGULASE NEGATIVE) THE SIGNIFICANCE OF ISOLATING THIS ORGANISM FROM A SINGLE SET OF BLOOD CULTURES WHEN MULTIPLE SETS ARE DRAWN IS UNCERTAIN. PLEASE NOTIFY THE MICROBIOLOGY DEPARTMENT WITHIN ONE WEEK IF SPECIATION AND SENSITIVITIES ARE REQUIRED.    Report Status 04/23/2016 FINAL  Final     Labs: Basic Metabolic Panel:  Recent Labs Lab 04/21/16 1453 04/23/16 0401 04/24/16 0424 04/25/16 0521  NA 142 142 144 143  K 4.2 2.3* 3.3* 4.0  CL 106 104 107 108  CO2 24 28 24 27   GLUCOSE 110*  102* 98 82  BUN 5* 5* 7 5*  CREATININE 0.42* 0.55 0.38* 0.34*  CALCIUM 8.9 8.7* 8.9 8.7*   Liver Function Tests: No results for input(s): AST, ALT, ALKPHOS, BILITOT, PROT, ALBUMIN in the last 168 hours. No results for input(s): LIPASE, AMYLASE in the last 168 hours. No results for input(s): AMMONIA in the last 168 hours. CBC:  Recent Labs Lab 04/24/16 0424 04/25/16 0521 04/26/16 0604 04/27/16 0756 04/28/16 0324  WBC 8.3 7.4 8.5 6.6 8.0  HGB 12.4 12.1 10.6* 9.9* 10.5*  HCT 38.3 37.2 33.3* 30.7* 33.7*  MCV 111.0* 112.4* 111.7* 112.9* 112.7*  PLT 434* 423* 419* 383 421*   Cardiac Enzymes: No results for input(s): CKTOTAL, CKMB, CKMBINDEX, TROPONINI in the last 168 hours. BNP: BNP (last 3 results) No results for input(s): BNP in the last 8760 hours.  ProBNP (last 3 results) No  results for input(s): PROBNP in the last 8760 hours.  CBG: No results for input(s): GLUCAP in the last 168 hours.     SignedDomenic Polite MD.  [facility] 04/28/2016, 1:44 PM

## 2016-04-28 NOTE — Progress Notes (Signed)
[facility] PROGRESS NOTE  Sandy Barrett PJA:[id number] DOB: December 26, 1932 DOA: 04/20/2016   PCP: [name], MD  Narrative: 83 y.o. 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 Found to have aspiration pneumonia and dysphagia. Multifactorial dysphagia from prior CVA, debility, age and unable to say if large multinodular goitre affecting swallowing too. Palliative consulted, due to family insistence, [facility] consulted for surgical opinion for thyroidectomy, poor surgical candidate and now daughters insist on ENT opinion before discharge, Hospice being considered  Assessment and plan  Aspiration pneumonia -has multifactorial dysphagia -due to advanced age, Debility, large MCA stroke in 2016 and large multinodular goitre is definitley not helping, on esophagram it is causing marked displacement of esophagus, and smooth stricture of the upper esophagus at level of thoracic inlet is likely due to external compression by substernal goiter -s/p SLP evaluation, on D3 diet now -on IV Zosyn day 8, change to PO Augmentin for 2days to complete 10days of abx, nebs for pulm toilet, NT suctioning PRN etc -see discussion on goitre below -I discussed the need for Palliative care discussions and limited options with dysphagia/aspiration pneumonia and limited options for multinodular goitre as discussed below -Palliative consulted for Goals of care, s/p family meeting, DNR, family more realistic than seemed earlier, consideration for Hospice at discharge  Multinodular goitre -Repeat CT soft tissue neck,  shows only slight interval growth of large goiter, no significant change compared to 2017 she is not a good surgical candidate she  has already seen [facility] Dr.Gerkin in 2017, I called and d/w Dr.Sean Loanne Drilling and he felt that due to her debilitated status she may not be able to manage isolation for 5days and radioactive iodine at this time, daughter had more questions abt Radioactive iodone/risk to care giver, she called Dr.Ellison's office notes from EMR reviewed, plan for outpatient FU with Dr.Ellison next week to decide on radio-active iodine option. -Last TSH and freeT4 on 04/01/16 were normal on current dose of Methimazole, resumed methimazole -family requested second surgical opinion for thyroidectomy despite being told numerous times that she is a very poor surgical candidate given her tenuous resp status, due to persistent family request asked [facility] for input, felt to be a poor surgical candidate by [facility] again, Dr.Cornett suggested ENT eval and hence ENT consult requested per family insistence, d/w Dr.Rosen this am  Hyperkalemia -s/p one dose of Kayexalate -resolved  Hypertension -Currently controlled, monitor  Large MCA stroke with Spastic hemiparesis (right sided), dysphagia and aphasia - bed/wheel chair bound at baseline -and h/o DVT and PE in 2016. -on eliquis at home,  Was briefly on lovenox , restarted home eliquis  H/o  PE on eliquis at home -resumed eliquis  Thrombocytosis  Followed by Dr Waldon Merl -resumed hydroxyurea   DVT prophylaxsis Lovenox  Code Status:   Full code Family Communication: daughter at bedside Disposition Plan: home soon, pending further palliative discussions    Consultants: Palliative consult  Procedures:  None  Antibiotics: Anti-infectives    Start     Dose/Rate Route Frequency Ordered Stop   04/20/16 2359  piperacillin-tazobactam (ZOSYN) IVPB 3.375 g     3.375 g 12.5 mL/hr over 240 Minutes Intravenous Every 8 hours 04/20/16 2341  04/20/16 1230  piperacillin-tazobactam (ZOSYN) IVPB 3.375 g     3.375 g 100 mL/hr over 30 Minutes Intravenous  Once 04/20/16  1227 04/20/16 1713         HPI/Subjective: More comfortable, less gurgling and congestion  Objective: Vitals:   04/28/16 0300 04/28/16 0520 04/28/16 0815 04/28/16 1006  BP:  108/70  114/60  Pulse:  91  91  Resp:  20    Temp:  98.8 F (37.1 C)    TempSrc:  Oral    SpO2:  100% 98%   Weight: 73.7 kg (162 lb 6.4 oz)       Intake/Output Summary (Last 24 hours) at 04/28/16 1256 Last data filed at 04/27/16 1447  Gross per 24 hour  Intake               40 ml  Output                0 ml  Net               40 ml    Exam:  Examination:   General: chronically ill female, frail, aphasic, pleasant, no distress today Lungs:: rare ronchi, some conducted upper airway sounds CVS: S1 & S2 heard, RRR. No JVD, murmurs, rubs Abd: : nondistended, soft and nontender. Normal bowel sounds heard. Central nervous system: awake, aphasic, R hemiplegia, unchanged Extremities: R hemiplegia Skin: No rashes, lesions or ulcers Psychiatry: unable to assess, pleasant    Data Reviewed: I have personally reviewed following labs and imaging studies  Micro Results Recent Results (from the past 240 hour(s))  Culture, blood (routine x 2) Call MD if unable to obtain prior to antibiotics being given     Status: None   Collection Time: 04/20/16  7:35 PM  Result Value Ref Range Status   Specimen Description BLOOD LEFT ARM  Final   Special Requests IN PEDIATRIC BOTTLE BCAV  Final   Culture NO GROWTH 5 DAYS  Final   Report Status 04/25/2016 FINAL  Final  Culture, blood (routine x 2) Call MD if unable to obtain prior to antibiotics being given     Status: Abnormal   Collection Time: 04/20/16  7:37 PM  Result Value Ref Range Status   Specimen Description BLOOD RIGHT HAND  Final   Special Requests IN PEDIATRIC BOTTLE Blood Culture adequate volume  Final   Culture  Setup Time   Final    GRAM POSITIVE COCCI IN CLUSTERS IN PEDIATRIC BOTTLE CRITICAL RESULT CALLED TO, READ BACK BY AND VERIFIED WITH: CARON  AMEND,PHARMD @0025  04/22/16 MKELLY,MLT    Culture (A)  Final    STAPHYLOCOCCUS SPECIES (COAGULASE NEGATIVE) THE SIGNIFICANCE OF ISOLATING THIS ORGANISM FROM A SINGLE SET OF BLOOD CULTURES WHEN MULTIPLE SETS ARE DRAWN IS UNCERTAIN. PLEASE NOTIFY THE MICROBIOLOGY DEPARTMENT WITHIN ONE WEEK IF SPECIATION AND SENSITIVITIES ARE REQUIRED.    Report Status 04/23/2016 FINAL  Final    Radiology Reports Dg Chest 1 View  Result Date: 04/14/2016 CLINICAL DATA:  Chest congestion, cough, dysphagia for the past 5 days. History previous CVA, pulmonary embolism. Patient unable to raise the chin above the chest. EXAM: CHEST 1 VIEW COMPARISON:  Portable chest x-ray of May 31, 2015 and chest CT scan of May 31, 2015. FINDINGS: Again demonstrated is a large soft tissue mass which deviates the trachea to the right most compatible with a goiter which is been demonstrated on previous chest x-ray and CT scan. The lungs are well-expanded. There are stable calcifications in  the left upper hemithorax which lie in the soft tissues superficial to the scapula on the previous CT scan. The cardiac silhouette remains enlarged. The pulmonary vascularity is not engorged. There is calcification in the wall of the aortic arch. There is degenerative change of both shoulders. IMPRESSION: No acute pneumonia nor pulmonary edema.  Stable mild cardiomegaly. Thoracic aortic atherosclerosis. Large goiter with deviation of the trachea toward the right with narrowing of the tracheal lumen. This has progressed somewhat since the previous study. Electronically Signed   By: David  Martinique M.D.   On: 04/14/2016 08:28   Dg Chest 2 View  Result Date: 04/22/2016 CLINICAL DATA:  Acute onset of cough.  Initial encounter. EXAM: CHEST  2 VIEW COMPARISON:  Chest radiograph performed 04/20/2016 FINDINGS: The lungs are well-aerated. Right perihilar opacity is new from the study performed 2 days ago, and may reflect pneumonia. There is no evidence of pleural  effusion or pneumothorax. Underlying vascular congestion is noted. As before, a very large thyroid goiter displaces the trachea to the right, with tracheal narrowing. This finding is stable from 2012. The heart is mildly enlarged. No acute osseous abnormalities are seen. Degenerative change is noted at the glenohumeral joints. IMPRESSION: 1. Right perihilar opacity is new from the study performed 2 days ago, and may reflect pneumonia. Underlying vascular congestion and mild cardiomegaly. 2. Very large thyroid goiter again noted, displacing the trachea to the right, with tracheal narrowing. This is stable from 2012. Electronically Signed   By: Garald Balding M.D.   On: 04/22/2016 19:47   Dg Chest 2 View  Result Date: 04/20/2016 CLINICAL DATA:  Cough and shortness of breath. EXAM: CHEST  2 VIEW COMPARISON:  04/13/2016 and chest CT on 05/31/2015 FINDINGS: Heart size is stable. Large mediastinal mass resulting in tracheal narrowing and deviation to the right is stable, and consistent with large substernal goiter, as demonstrated on previous CT. No evidence of pulmonary infiltrate or edema. Limited visualization on lateral projection due to position of patient's arms. IMPRESSION: No acute findings. Stable large substernal goiter causing tracheal narrowing and deviation to the right. Electronically Signed   By: Earle Gell M.D.   On: 04/20/2016 10:13   Ct Soft Tissue Neck W Contrast  Result Date: 04/21/2016 CLINICAL DATA:  Continued surveillance multinodular goiter. EXAM: CT NECK WITH CONTRAST TECHNIQUE: Multidetector CT imaging of the neck was performed using the standard protocol following the bolus administration of intravenous contrast. CONTRAST:  42mL ISOVUE-300 IOPAMIDOL (ISOVUE-300) INJECTION 61% COMPARISON:  07/01/2015. FINDINGS: Pharynx and larynx: Normal pharyngeal structures. Severe rightward displacement of the proximal trachea and larynx due to the leftward dominant multinodular goiter. This may be  increased from prior of June 2017. Salivary glands: No inflammation, mass, or stone. Thyroid: Markedly enlarged and heterogeneous thyroid, with multiple nodules and scattered calcifications, significant substernal extent. In addition to mass effect on the trachea, the SVC is displaced and effaced. Approximate measurements are 97 x 45 x 68 mm, although the entire craniocaudal extent is not assessed on this CT neck study. Lymph nodes: None enlarged or abnormal density. Vascular: Occluded LEFT ICA. Calcifications in the LEFT internal jugular are redemonstrated consistent with chronic thrombosis. Limited intracranial: LEFT temporal lobe encephalomalacia better visualized on previous exam. Visualized orbits: Not visualized. Mastoids and visualized paranasal sinuses: Clear. Skeleton: No acute osseous findings. Cervical spondylosis. Nuchal ligament ossification. Upper chest: No pulmonary nodule or pneumothorax. Other: None. IMPRESSION: Slight interval growth since 2017 of large goiter. Significant LEFT-to-RIGHT mass effect on the trachea  and SVC; see discussion above. Electronically Signed   By: Staci Righter M.D.   On: 04/21/2016 13:19   Dg Esophagus  Result Date: 04/23/2016 CLINICAL DATA:  Dysphagia.  Substernal goiter. EXAM: ESOPHOGRAM/BARIUM SWALLOW TECHNIQUE: Single contrast examination was performed using  thin barium. FLUOROSCOPY TIME:  Fluoroscopy Time: 1 minutes 48 seconds ; low dose pulsed fluoroscopy Radiation Exposure Index (if provided by the fluoroscopic device): 15.6 mGy Number of Acquired Spot Images: 0 COMPARISON:  None. FINDINGS: Technically difficult exam as patient could not drink from cup or straw, and thin barium was administered orally by syringe. The patient could only take small swallows of barium. A large substernal goiter is seen causing marked displacement of the esophagus to the right. A smooth stricture of the upper esophagus is seen at level thoracic inlet, likely due to external  compression by substernal goiter. This does not obstruct the passage of thin barium. No evidence of distal esophageal stricture or hiatal hernia. Lack of primary peristalsis resulting in esophageal stasis. IMPRESSION: Technically difficult exam. Large substernal goiter causes marked displacement of esophagus to the right. Smooth stricture of the upper esophagus at level of thoracic inlet is likely due to external compression by substernal goiter. Electronically Signed   By: Earle Gell M.D.   On: 04/23/2016 14:45   Dg Swallowing Func-speech Pathology  Result Date: 04/21/2016 Objective Swallowing Evaluation: Type of Study: MBS-Modified Barium Swallow Study Patient Details Name: Lindell Tussey MRN: [id number] Date of Birth: 1932/01/22 Today's Date: 04/21/2016 Time: SLP Start Time (ACUTE ONLY): 1039-SLP Stop Time (ACUTE ONLY): 1053 SLP Time Calculation (min) (ACUTE ONLY): 14 min Past Medical History: Past Medical History: Diagnosis Date . ARTHRITIS  . Arthritis  . Diverticulosis  . HYPERTENSION  . HYPERTHYROIDISM  . Hyperthyroidism   s/p I-131 ablation 03/2011 of multinod goiter . INSOMNIA  . MIGRAINE HEADACHE  . OBESITY  . Posttraumatic stress disorder  . Pulmonary embolism ([place]) 03/2014  with DVT . Stroke [facility]) 03/2014  dysarthria Past Surgical History: Past Surgical History: Procedure Laterality Date . ABDOMINAL HYSTERECTOMY  1976 . BREAST SURGERY    biopsy . RADIOLOGY WITH ANESTHESIA Left 03/08/2014  Procedure: RADIOLOGY WITH ANESTHESIA;  Surgeon: Rob Hickman, MD;  Location: [place];  Service: Radiology;  Laterality: Left; . TONSILLECTOMY   . TOTAL HIP ARTHROPLASTY  1998   right HPI: Ptis a 83 y.o.femalewith medical history significant of hyperthyroidism, HTN, migraine headache, left MCA stroke with residual right sided weakness and aphasia, and PE who presented to the ED with c/o cough and wheezing. Patient seen by PCP on 04/13/2016 and diagnosed with CAP and UTI. CXR showed no acute findings, stable large  substernal goiter causing tracheal narrowing and deviation to the right. No Data Recorded Assessment / Plan / Recommendation CHL IP CLINICAL IMPRESSIONS 04/21/2016 Clinical Impression Ms. Vale presents with a mild oral/pharyngeal dysphagia, suspect primary esophageal dysphagia. Oral phase impairments included mild prolonged mastication with regular solids (pt wears partial dentures). Observed mild vallecular and pyriform sinus residue with thin liquids via cup/straw due to reduced laryngeal elevation and epiglottic inversion. MBS does not diagnose below the level of the UES however scan of esophagus revealed stasis within the mid-esophagus and questionable stenosis. Pt previously diagnosed with a substernal goiter causing tracheal narrowing and deviation to the right possible causing observations. Educated pt re: results of MBS, diet recommendations, esophageal precautions (alternate solids/liquids, sit upright after meals), and advised pt to consider GI consult due to esophageal findings. Recommend Dys 3 solids, thin liquids (  straws ok), meds crushed. ST will f/u briefly to assess diet tolerance and for continued education. SLP Visit Diagnosis Dysphagia, pharyngoesophageal phase (R13.14) Attention and concentration deficit following -- Frontal lobe and executive function deficit following -- Impact on safety and function Mild aspiration risk   CHL IP TREATMENT RECOMMENDATION 04/21/2016 Treatment Recommendations Therapy as outlined in treatment plan below   Prognosis 04/21/2016 Prognosis for Safe Diet Advancement Good Barriers to Reach Goals -- Barriers/Prognosis Comment -- CHL IP DIET RECOMMENDATION 04/21/2016 SLP Diet Recommendations Dysphagia 3 (Mech soft) solids;Thin liquid Liquid Administration via Cup;Straw Medication Administration Crushed with puree Compensations Slow rate;Small sips/bites;Follow solids with liquid Postural Changes Seated upright at 90 degrees   CHL IP OTHER RECOMMENDATIONS 04/21/2016  Recommended Consults Consider GI evaluation;Consider esophageal assessment Oral Care Recommendations Oral care BID Other Recommendations --   CHL IP FOLLOW UP RECOMMENDATIONS 04/21/2016 Follow up Recommendations Skilled Nursing facility;24 hour supervision/assistance   CHL IP FREQUENCY AND DURATION 04/21/2016 Speech Therapy Frequency (ACUTE ONLY) min 1 x/week Treatment Duration 1 week      CHL IP ORAL PHASE 04/21/2016 Oral Phase Impaired Oral - Pudding Teaspoon -- Oral - Pudding Cup -- Oral - Honey Teaspoon -- Oral - Honey Cup -- Oral - Nectar Teaspoon -- Oral - Nectar Cup -- Oral - Nectar Straw -- Oral - Thin Teaspoon -- Oral - Thin Cup WFL Oral - Thin Straw WFL Oral - Puree -- Oral - Mech Soft -- Oral - Regular (No Data) Oral - Multi-Consistency -- Oral - Pill -- Oral Phase - Comment --  CHL IP PHARYNGEAL PHASE 04/21/2016 Pharyngeal Phase Impaired Pharyngeal- Pudding Teaspoon -- Pharyngeal -- Pharyngeal- Pudding Cup -- Pharyngeal -- Pharyngeal- Honey Teaspoon -- Pharyngeal -- Pharyngeal- Honey Cup -- Pharyngeal -- Pharyngeal- Nectar Teaspoon -- Pharyngeal -- Pharyngeal- Nectar Cup -- Pharyngeal -- Pharyngeal- Nectar Straw -- Pharyngeal -- Pharyngeal- Thin Teaspoon -- Pharyngeal -- Pharyngeal- Thin Cup Pharyngeal residue - valleculae;Pharyngeal residue - pyriform;Reduced laryngeal elevation;Reduced epiglottic inversion Pharyngeal -- Pharyngeal- Thin Straw Pharyngeal residue - valleculae;Pharyngeal residue - pyriform;Reduced laryngeal elevation;Reduced epiglottic inversion Pharyngeal -- Pharyngeal- Puree -- Pharyngeal -- Pharyngeal- Mechanical Soft -- Pharyngeal -- Pharyngeal- Regular Pharyngeal residue - valleculae;Reduced epiglottic inversion;Reduced laryngeal elevation Pharyngeal -- Pharyngeal- Multi-consistency -- Pharyngeal -- Pharyngeal- Pill -- Pharyngeal -- Pharyngeal Comment --  CHL IP CERVICAL ESOPHAGEAL PHASE 04/21/2016 Cervical Esophageal Phase WFL Pudding Teaspoon -- Pudding Cup -- Honey Teaspoon -- Honey  Cup -- Nectar Teaspoon -- Nectar Cup -- Nectar Straw -- Thin Teaspoon -- Thin Cup -- Thin Straw -- Puree -- Mechanical Soft -- Regular -- Multi-consistency -- Pill -- Cervical Esophageal Comment -- CHL IP GO 08/08/2015 Functional Assessment Tool Used mbs Functional Limitations Swallowing Swallow Current Status (R6789) CJ Swallow Goal Status (F8101) CJ Swallow Discharge Status (B5102) CJ Motor Speech Current Status (H8527) (None) Motor Speech Goal Status (P8242) (None) Motor Speech Goal Status (P5361) (None) Spoken Language Comprehension Current Status (W4315) (None) Spoken Language Comprehension Goal Status (Q0086) (None) Spoken Language Comprehension Discharge Status (P6195) (None) Spoken Language Expression Current Status (K9326) (None) Spoken Language Expression Goal Status (Z1245) (None) Spoken Language Expression Discharge Status (Y0998) (None) Attention Current Status (P3825) (None) Attention Goal Status (K5397) (None) Attention Discharge Status (Q7341) (None) Memory Current Status (P3790) (None) Memory Goal Status (W4097) (None) Memory Discharge Status (D5329) (None) Voice Current Status (J2426) (None) Voice Goal Status (S3419) (None) Voice Discharge Status (Q2229) (None) Other Speech-Language Pathology Functional Limitation Current Status (N9892) (None) Other Speech-Language Pathology Functional Limitation Goal Status (J1941) (None) Other Speech-Language  Pathology Functional Limitation Discharge Status [phone]) (None) [place] Siren 04/21/2016, 2:50 PM Orbie Pyo Colvin Caroli.Ed CCC-SLP Pager [phone]                CBC  Recent Labs Lab 04/24/16 0424 04/25/16 0521 04/26/16 0604 04/27/16 0756 04/28/16 0324  WBC 8.3 7.4 8.5 6.6 8.0  HGB 12.4 12.1 10.6* 9.9* 10.5*  HCT 38.3 37.2 33.3* 30.7* 33.7*  PLT 434* 423* 419* 383 421*  MCV 111.0* 112.4* 111.7* 112.9* 112.7*  MCH 35.9* 36.6* 35.6* 36.4* 35.1*  MCHC 32.4 32.5 31.8 32.2 31.2  RDW 14.7 14.9 14.4 14.6 14.2    Chemistries   Recent  Labs Lab 04/21/16 1453 04/23/16 0401 04/24/16 0424 04/25/16 0521  NA 142 142 144 143  K 4.2 2.3* 3.3* 4.0  CL 106 104 107 108  CO2 24 28 24 27   GLUCOSE 110* 102* 98 82  BUN 5* 5* 7 5*  CREATININE 0.42* 0.55 0.38* 0.34*  CALCIUM 8.9 8.7* 8.9 8.7*   ------------------------------------------------------------------------------------------------------------------ estimated creatinine clearance is 49.5 mL/min (A) (by C-G formula based on SCr of 0.34 mg/dL (L)). ------------------------------------------------------------------------------------------------------------------ No results for input(s): HGBA1C in the last 72 hours. ------------------------------------------------------------------------------------------------------------------ No results for input(s): CHOL, HDL, LDLCALC, TRIG, CHOLHDL, LDLDIRECT in the last 72 hours. ------------------------------------------------------------------------------------------------------------------ No results for input(s): TSH, T4TOTAL, T3FREE, THYROIDAB in the last 72 hours.  Invalid input(s): FREET3 ------------------------------------------------------------------------------------------------------------------ No results for input(s): VITAMINB12, FOLATE, FERRITIN, TIBC, IRON, RETICCTPCT in the last 72 hours.  Coagulation profile No results for input(s): INR, PROTIME in the last 168 hours.  No results for input(s): DDIMER in the last 72 hours.  Cardiac Enzymes No results for input(s): CKMB, TROPONINI, MYOGLOBIN in the last 168 hours.  Invalid input(s): CK ------------------------------------------------------------------------------------------------------------------ Invalid input(s): POCBNP   CBG: No results for input(s): GLUCAP in the last 168 hours.     Studies: No results found.    Lab Results  Component Value Date   HGBA1C 5.4 03/09/2014   Lab Results  Component Value Date   LDLCALC 53 03/09/2014   CREATININE  0.34 (L) 04/25/2016       Scheduled Meds: . apixaban  2.5 mg Oral BID  . chlorhexidine  15 mL Mouth Rinse BID  . diclofenac sodium  2 g Topical QID  . guaiFENesin  600 mg Oral BID  . hydroxyurea  500 mg Oral Daily  . ipratropium-albuterol  3 mL Nebulization TID  . mouth rinse  15 mL Mouth Rinse q12n4p  . methimazole  5 mg Oral BID  . propranolol  10 mg Oral BID   Continuous Infusions: . piperacillin-tazobactam (ZOSYN)  IV       LOS: 8 days    Time spent: >35 MINS    Kila Godina  [facility] Page via [email or web address], password [email or web address]  If 7PM-7AM, please contact night-coverage at [email or web address], password [facility] 04/28/2016, 12:56 PM  LOS: 8 days

## 2016-04-28 NOTE — Progress Notes (Signed)
Pt taken off O2 per SAT of 98% on 1L. Pt on RM at this time.

## 2016-04-28 NOTE — Progress Notes (Signed)
Subjective: Daughters at bedside  Pt appears comfortable no stridor   Objective: Vital signs in last 24 hours: Temp:  [97.8 F (36.6 C)-99.3 F (37.4 C)] 98.8 F (37.1 C) (04/24 0520) Pulse Rate:  [91-98] 91 (04/24 0520) Resp:  [18-20] 20 (04/24 0520) BP: (95-120)/(57-75) 108/70 (04/24 0520) SpO2:  [98 %-100 %] 98 % (04/24 0815) Weight:  [73.7 kg (162 lb 6.4 oz)] 73.7 kg (162 lb 6.4 oz) (04/24 0300) Last BM Date: 04/22/16  Intake/Output from previous day: 04/23 0701 - 04/24 0700 In: 60 [P.O.:60] Out: -  Intake/Output this shift: No intake/output data recorded.  General appearance: alert, cooperative and fatigued Head: Normocephalic, without obvious abnormality, atraumatic Throat: goiter present   Lab Results:   Recent Labs  04/27/16 0756 04/28/16 0324  WBC 6.6 8.0  HGB 9.9* 10.5*  HCT 30.7* 33.7*  PLT 383 421*   BMET No results for input(s): NA, K, CL, CO2, GLUCOSE, BUN, CREATININE, CALCIUM in the last 72 hours. PT/INR No results for input(s): LABPROT, INR in the last 72 hours. ABG No results for input(s): PHART, HCO3 in the last 72 hours.  Invalid input(s): PCO2, PO2  Studies/Results: No results found.  Anti-infectives: Anti-infectives    Start     Dose/Rate Route Frequency Ordered Stop   04/20/16 2359  piperacillin-tazobactam (ZOSYN) IVPB 3.375 g     3.375 g 12.5 mL/hr over 240 Minutes Intravenous Every 8 hours 04/20/16 2341     04/20/16 1230  piperacillin-tazobactam (ZOSYN) IVPB 3.375 g     3.375 g 100 mL/hr over 30 Minutes Intravenous  Once 04/20/16 1227 04/20/16 1713      Assessment/Plan: Patient Active Problem List   Diagnosis Date Noted  . Pressure injury of skin 04/27/2016  . Thyroid mass   . Palliative care by specialist   . PNA (pneumonia) 04/20/2016  . Aspiration pneumonia ([place]) 04/20/2016  . Hemidiaphragm paralysis   . Wheelchair bound 02/03/2016  . Conjunctival hemorrhage of right eye 01/28/2016  . Ecchymosis 01/28/2016  .  Vaginal bleeding 01/28/2016  . Gross hematuria 01/28/2016  . Supratherapeutic INR 01/28/2016  . Excessive cerumen in ear canal, bilateral 12/31/2015  . Leg swelling 12/31/2015  . Discoloration of skin 09/26/2015  . Dermatitis 09/13/2015  . Essential thrombocytosis ([place]) 07/31/2015  . SVC (superior vena cava obstruction), chronic 07/20/2015  . Dysphagia 07/20/2015  . Elevated liver enzymes 07/20/2015  . Constipation 07/20/2015  . Thrombophlebitis of breast, right 06/18/2015  . Tracheal deviation 06/06/2015  . Sensorineural hearing loss, bilateral, moderate-moderately severe 04/30/2015  . Abdominal wall lump 03/09/2015  . Frequent UTI 02/06/2015  . Primary osteoarthritis involving multiple joints 07/26/2014  . Pernicious anemia 07/26/2014  . Hearing loss 07/26/2014  . Encounter for therapeutic drug monitoring 07/17/2014  . Spastic hemiparesis affecting dominant side ([place]), right 05/31/2014  . DVT of lower extremity, bilateral ([place]) 03/14/2014  . Global aphasia 03/14/2014  . 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 Aphasia S/P CVA 03/13/2014  . Cerebral infarction due to embolism of left middle cerebral artery ([place])   . Stroke, embolic ([place]) 66/44/0347  . History of pulmonary embolism 03/08/2014  . Back pain 08/29/2013  . Primary localized osteoarthrosis, lower leg 03/06/2013  . IBS (irritable bowel syndrome)   . Multinodular goiter 01/13/2011  . Hyperthyroidism 11/11/2009  . Insomnia 07/03/2009  . POSTTRAUMATIC STRESS DISORDER 08/22/2008  . OBESITY 04/21/2008  . Osteoarthritis 04/21/2008  . MIGRAINE HEADACHE 04/20/2008  . Essential hypertension 04/20/2008    Reviewed imaging as far back as  2011  The amount of change given size over that time is minimal She had a large goiter back at that time with probably compression but no CT at that time to compare for sure   Minimal change since May 2017   This has been present for many years and unlikely to account for change over the  last 3 months  With CVA high risk of CVA with operative intervention Symptoms multifactoral and unlikely to improve that much with surgery She would need median sternotomy for removal  Can consult ENT for another opinion if desired by family       LOS: 8 days    Kalvin Buss A. 04/28/2016

## 2016-04-28 NOTE — Progress Notes (Addendum)
Daily Progress Note   Patient Name: Sandy Barrett       Date: 04/28/2016 DOB: 11/01/1932  Age: 83 y.o. MRN#: [id number] Attending Physician: Domenic Polite, MD Primary Care Physician: [name], MD Admit Date: 04/20/2016  Reason for Consultation/Follow-up: Establishing goals of care, Hospice Evaluation, Non pain symptom management, Pain control and Psychosocial/spiritual support  Subjective: Patient appears happy and responds to me.  She is taking small bites of pancake from her daughter.  I sat and talked with [name] and [place] for about 30 min. They appear to do a wonderful job of taking care of their mother in the home.    Over the last 3 weeks her swallowing has become much worse.  She will have moments where it sounds as though something is caught in her airway and she is very frightened.  The family understands she is here with aspiration pneumonia and that it will continue to re-occur.  They are aware she is close to end of life and want her last months to be as good a possible.  They are a family with a very positive vibe.  They have a strong religious faith and give thanks to the [place] for many blessings.  There is thought that the goiter may have caused or contributed to the aspiration pneumonia.  CCS feels this is unlikely as the goiter has grown minimally and her symptoms have increased dramatically.  I feel that the aspiration is progression of symptoms originally set in motion by her stroke in 2016.  We discussed their need for assistance in the home - bathing, transferring and feeding their mother.  We also discussed the need to provide more medical support in the home rather than taking their mother out to MD appts   We discussed symptom management when their mother struggles  with raspy breathing and an element of panic.  We related each of these things to services hospice supports.  The family insightfully asked if there is a point to coming back to the hospital.  We discussed returning only if the patient can not be made comfortable where she is - we went on to discuss hospice house for respite and/or end of life.  There is a question of whether or not the patient could have treatment for the goiter while on hospice services.  She is not a surgical candidate.  I believe she could have treatment for the goiter as it is unrelated to her hospice diagnosis of neurological deficits from previous stroke, but I will defer that to the Hospice liaison.   Assessment: 83 yo female with PMH of thrombocytosis with previous PE and CVA, CVA in 2016 with residual aphasia, dysphagia, hemiplegia and bedbound status.  She has a large multinodular goiter which displaces her SVC and esophagus.   Now with rapid decrease in functionality, decreased nutrition (eating 1/4 of previous) and aspiration pneumonia.   Length of Stay: 8  Current Medications: Scheduled Meds:  . apixaban  2.5 mg Oral BID  . chlorhexidine  15 mL Mouth Rinse BID  . diclofenac sodium  2 g Topical QID  . guaiFENesin  600 mg Oral BID  . hydroxyurea  500 mg Oral Daily  . ipratropium-albuterol  3 mL Nebulization TID  . mouth rinse  15 mL Mouth Rinse q12n4p  . methimazole  5 mg Oral BID  . propranolol  10 mg Oral BID    Continuous Infusions: . piperacillin-tazobactam (ZOSYN)  IV      PRN Meds: acetaminophen **OR** acetaminophen, albuterol, bisacodyl, ondansetron **OR** ondansetron (ZOFRAN) IV, sennosides, traMADol, traZODone  Physical Exam        Pleasant well developed female, awake, alert, appears happy and in NAD   Vital Signs: BP 108/70 (BP Location: Right Arm)   Pulse 91   Temp 98.8 F (37.1 C) (Oral)   Resp 20   Wt 73.7 kg (162 lb 6.4 oz)   SpO2 98%   BMI 31.72 kg/m  SpO2: SpO2: 98 % O2  Device: O2 Device: Nasal Cannula O2 Flow Rate: O2 Flow Rate (L/min): (S) 1 L/min (weaned per SAT)  Intake/output summary:  Intake/Output Summary (Last 24 hours) at 04/28/16 0945 Last data filed at 04/27/16 1447  Gross per 24 hour  Intake               60 ml  Output                0 ml  Net               60 ml   LBM: Last BM Date: 04/22/16 Baseline Weight: Weight: 79.8 kg (176 lb) Most recent weight: Weight: 73.7 kg (162 lb 6.4 oz)       Palliative Assessment/Data:    Flowsheet Rows     Most Recent Value  Intake Tab  Referral Department  Hospitalist  Unit at Time of Referral  Med/Surg Unit  Palliative Care Primary Diagnosis  Sepsis/Infectious Disease  Date Notified  04/24/16  Reason for referral  Clarify Goals of Care, Counsel Regarding Hospice, Psychosocial or Spiritual support  Date of Admission  04/20/16  Date first seen by Palliative Care  04/26/16  # of days Palliative referral response time  2 Day(s)  # of days IP prior to Palliative referral  4  Clinical Assessment  Palliative Performance Scale Score  30%  Pain Max last 24 hours  Not able to report  Pain Min Last 24 hours  Not able to report  Dyspnea Max Last 24 Hours  Not able to report  Dyspnea Min Last 24 hours  Not able to report  Nausea Max Last 24 Hours  Not able to report  Nausea Min Last 24 Hours  Not able to report  Anxiety Max Last 24 Hours  Not able to report  Anxiety Min Last 24 Hours  Not able to report  Other Max Last 24 Hours  Not able to report  Psychosocial & Spiritual Assessment  Palliative Care Outcomes  Patient/Family meeting held?  Yes  Who was at the meeting?  daughters [name] and [place] CPR status, Provided psychosocial or spiritual support, Clarified goals of care, Counseled regarding hospice  Patient/Family wishes: Interventions discontinued/not started   Mechanical Ventilation, PEG, Trach, Vasopressors  Palliative Care follow-up planned  Yes, Facility       Patient Active Problem List   Diagnosis Date Noted  . Pressure injury of skin 04/27/2016  . Thyroid mass   . Palliative care by specialist   . PNA (pneumonia) 04/20/2016  . Aspiration pneumonia ([place]) 04/20/2016  . Hemidiaphragm paralysis   . Wheelchair bound 02/03/2016  . Conjunctival hemorrhage of right eye 01/28/2016  . Ecchymosis 01/28/2016  . Vaginal bleeding 01/28/2016  . Gross hematuria 01/28/2016  . Supratherapeutic INR 01/28/2016  . Excessive cerumen in ear canal, bilateral 12/31/2015  . Leg swelling 12/31/2015  . Discoloration of skin 09/26/2015  . Dermatitis 09/13/2015  . Essential thrombocytosis ([place]) 07/31/2015  . SVC (superior vena cava obstruction), chronic 07/20/2015  . Dysphagia 07/20/2015  . Elevated liver enzymes 07/20/2015  . Constipation 07/20/2015  . Thrombophlebitis of breast, right 06/18/2015  . Tracheal deviation 06/06/2015  . Sensorineural hearing loss, bilateral, moderate-moderately severe 04/30/2015  . Abdominal wall lump 03/09/2015  . Frequent UTI 02/06/2015  . Primary osteoarthritis involving multiple joints 07/26/2014  . Pernicious anemia 07/26/2014  . Hearing loss 07/26/2014  . Encounter for therapeutic drug monitoring 07/17/2014  . Spastic hemiparesis affecting dominant side ([place]), right 05/31/2014  . DVT of lower extremity, bilateral ([place]) 03/14/2014  . Global aphasia 03/14/2014  . 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 Aphasia S/P CVA 03/13/2014  . Cerebral infarction due to embolism of left middle cerebral artery ([place])   . Stroke, embolic ([place]) 43/32/9518  . History of pulmonary embolism 03/08/2014  . Back pain 08/29/2013  . Primary localized osteoarthrosis, lower leg 03/06/2013  . IBS (irritable bowel syndrome)   . Multinodular goiter 01/13/2011  . Hyperthyroidism 11/11/2009  . Insomnia 07/03/2009  . POSTTRAUMATIC STRESS DISORDER 08/22/2008  . OBESITY 04/21/2008  . Osteoarthritis 04/21/2008  . MIGRAINE HEADACHE 04/20/2008  .  Essential hypertension 04/20/2008    Palliative Care Plan    Recommendations/Plan: Home in daughter's care Home with Hospice Services - oxygen?, suction? Aid, RN, chaplain Consider D/C home with an RX for concentrated liquid ativan to use in case of aspiration and panic.  Code Status:  DNR   Prognosis:  < 6 months secondary to h/o stroke with aphasia, dysphagia, bedbound - with rapid recent decline, decreased ability to take nutrition, and aspiration pneumonia.  Discharge Planning: Home with Hospice  Care plan was discussed with Dr. Broadus John, patient and family as well as case management.  Thank you for allowing the Palliative Medicine Team to assist in the care of this patient.  Total time spent:  35 min.     Greater than 50%  of this time was spent counseling and coordinating care related to the above assessment and plan.  Imogene Burn, PA-C Palliative Medicine  Please contact Palliative MedicineTeam phone at [phone] for questions and concerns between 7 am - 7 pm.   Please see AMION for individual provider pager numbers.

## 2016-04-28 NOTE — Progress Notes (Signed)
Occupational Therapy Treatment Patient Details Name: Sandy Barrett MRN: [id number] DOB: 11/07/1932 Today's Date: 04/28/2016    History of present illness 83 y.o. female with medical history significant of Hyperthyroidism, HTN, migraine headache, left MCA stroke with residual right sided weakness and aphasia 2016,  PE admitted due to aspiration PNA.   OT comments  Pt progressing toward OT goals. Able to complete oral care with set-up and VC's at bed level this session. Educated pt and daughter concerning strategies to improve grooming independence at home and daughter verbalizes understanding. Reinforced education concerning R UE positioning. Pt demonstrated grimacing with PROM R shoulder and limited this to ~75 degrees accordingly. OT will continue to follow while admitted and D/C plan remains appropriate.   Follow Up Recommendations  No OT follow up    Equipment Recommendations  None recommended by OT    Recommendations for Other Services      Precautions / Restrictions Precautions Precautions: Fall Precaution Comments: flaccid RUE Restrictions Weight Bearing Restrictions: No       Mobility Bed Mobility                  Transfers                 General transfer comment: Per daughter, pt sat in chair for a few hours this am and had recently returned to bed on OT arrival.    Balance                                           ADL either performed or assessed with clinical judgement   ADL Overall ADL's : Needs assistance/impaired     Grooming: Oral care;Set up;Supervision/safety;Cueing for sequencing;Bed level                                 General ADL Comments: Pt able to complete oral care tasks after set-up with L UE. Pt's daughter reports that today pt completed task more thoroughly than usual.     Vision       Perception     Praxis      Cognition Arousal/Alertness: Awake/alert Behavior During Therapy: WFL  for tasks assessed/performed Overall Cognitive Status: History of cognitive impairments - at baseline                                          Exercises General Exercises - Upper Extremity Shoulder Flexion: PROM;Right;10 reps (Limited to ~75 degrees due to grimacing) Elbow Flexion: PROM;Right;10 reps Wrist Flexion: PROM;Right;10 reps Wrist Extension: PROM;Right;10 reps Digit Composite Flexion: PROM;Right;10 reps Composite Extension: PROM;Right;10 reps (limited by MCP and PIP contractures)   Shoulder Instructions       General Comments      Pertinent Vitals/ Pain       Pain Assessment: Faces Faces Pain Scale: Hurts even more Pain Location: grimacing with R UE PROM Pain Descriptors / Indicators: Grimacing Pain Intervention(s): Monitored during session;Repositioned  Home Living                                          Prior Functioning/Environment  Frequency  Min 2X/week        Progress Toward Goals  OT Goals(current goals can now be found in the care plan section)  Progress towards OT goals: Progressing toward goals  Acute Rehab OT Goals Patient Stated Goal: To return home OT Goal Formulation: With patient Time For Goal Achievement: 04/29/16 Potential to Achieve Goals: Good ADL Goals Pt Will Perform Eating: sitting;with set-up (with L hand) Pt Will Perform Grooming: with set-up;sitting (with L hand) Pt/caregiver will Perform Home Exercise Program: Right Upper extremity;Increased ROM;With written HEP provided Additional ADL Goal #1: Pts daughter will keep RUE propped up to decrease edema [place]  Plan Discharge plan remains appropriate    Co-evaluation                 End of Session    OT Visit Diagnosis: Pain;Hemiplegia and hemiparesis Hemiplegia - Right/Left: Right Hemiplegia - dominant/non-dominant: Dominant Hemiplegia - caused by: Cerebral infarction Pain - Right/Left: Right Pain - part of body:  Shoulder   Activity Tolerance Patient tolerated treatment well   Patient Left in bed;with call bell/phone within reach;with family/visitor present   Nurse Communication Mobility status        Time: 1545-1601 OT Time Calculation (min): 16 min  Charges: OT General Charges $OT Visit: 1 Procedure OT Treatments $Therapeutic Activity: 8-22 mins  Norman Herrlich, MS OTR/L  Pager: [place] A Deniel Mcquiston 04/28/2016, 5:42 PM

## 2016-04-28 NOTE — Care Management Important Message (Signed)
Important Message  Patient Details  Name: Sandy Barrett MRN: [id number] Date of Birth: 1932-07-11   Medicare Important Message Given:  Yes    Cindia Hustead Montine Circle 04/28/2016, 2:34 PM

## 2016-04-28 NOTE — Progress Notes (Signed)
CM received consult:  Hospice Services at home. Family is 41+% certain they want hospice at home. Would like more info.  They will need a hospice nurse aide that knows how to help with [facility] lift.  CM spoke with pt's daughters @ bedside and confirmed discharge plan is home with hospice care. Choice list provided and daughters selected [place]. Referral made to [facility] /Amy @ [phone]. Daughters state mom already has hospital bed. CM to f/u with disposition needs. Whitman Hero RN,BSN, [place]

## 2016-04-28 NOTE — Progress Notes (Signed)
Nutrition Brief Note  Chart reviewed. RD drawn to pt chart due to low braden score. Pt transitioning to hospice services. Palliative care team following; pt on a dysphagia 3 diet for comfort, No further nutrition interventions warranted at this time.  Please re-consult as needed.   Avantae Bither A. Jimmye Norman, RD, LDN, CDE Pager: [phone] After hours Pager: [phone]

## 2016-04-28 NOTE — Progress Notes (Signed)
CM spoke with Amy / [facility] liaison  @ [phone]. Amy is coming to speak with daughters @ bedside. Whitman Hero RN,BSN,CM

## 2016-04-28 NOTE — Care Management Note (Signed)
Case Management Note  Patient Details  Name: Sandy Barrett MRN: [id number] Date of Birth: Apr 19, 1932  Subjective/Objective:             Pt presented with  aspiration pneumonia, hx of Hyperthyroidism, HTN, migraine headache, left MCA stroke with residual right sided weakness and aphasia 2016, and PE. Resides with daughter .  Monti Villers (Daughter) Mersedes Alber (Daughter)    [phone]       PCP: [place] Amy  Action/Plan: Pt's daughters declined home hospice services 2/2 to daughters wanting mom to continue with botox /cortisone injections and continued therapy for swallowing and eating issues. Plan is to d/c to home with home health services @ d/c when medically stable.  Expected Discharge Date:  04/23/16               Expected Discharge Plan:  [place]  In-House Referral:     Discharge planning Services  CM Consult  Post Acute Care Choice:    Choice offered to:  Adult Children  DME Arranged:    DME Agency:     HH Arranged:    HH Agency:  [facility]/ selected for home health services @ d/c.  Status of Service:  In process, will continue to follow  If discussed at Long Length of Stay Meetings, dates discussed:    Additional Comments:  Sharin Mons, RN 04/28/2016, 2:50 PM

## 2016-04-28 NOTE — Consult Note (Signed)
Reason for Consult: Goiter Referring Physician: Domenic Polite, MD  Sandy Barrett is an 83 y.o. female.  HPI: History of large substernal goiter at least since 2011. CT scan about a year ago compared to this weeks' CT scan reveals slight enlargement. She had consultation with Dr. Delana Barrett who recommended against surgical intervention. She had a stroke in 2016. Since then she has had a fascia and intermittent trouble with swallowing but not consistent. She now lives with her daughter. She was on a regular diet until about a month ago when she started having some trouble swallowing. She would have occasional episodes of choking and food would not pass and she would panic and feel as though she was going to die. She was admitted last week with pneumonia. Swallowing evaluation does not reveal aspiration. Imaging does reveal a large substernal goiter with displacement of the esophagus. She normally does not have any trouble breathing.  Past Medical History:  Diagnosis Date  . ARTHRITIS   . Arthritis   . Diverticulosis   . HYPERTENSION   . HYPERTHYROIDISM   . Hyperthyroidism    s/p I-131 ablation 03/2011 of multinod goiter  . INSOMNIA   . MIGRAINE HEADACHE   . OBESITY   . Posttraumatic stress disorder   . Pulmonary embolism (Sandy Barrett) 03/2014   with DVT  . Stroke Sandy Barrett) 03/2014   dysarthria    Past Surgical History:  Procedure Laterality Date  . ABDOMINAL HYSTERECTOMY  1976  . BREAST SURGERY     biopsy  . RADIOLOGY WITH ANESTHESIA Left 03/08/2014   Procedure: RADIOLOGY WITH ANESTHESIA;  Surgeon: Sandy Hickman, MD;  Location: [facility];  Service: Radiology;  Laterality: Left;  . TONSILLECTOMY    . TOTAL HIP ARTHROPLASTY  1998    right    Family History  Problem Relation Age of Onset  . Asthma Mother   . Asthma Father   . Prostate cancer Father   . Stroke Brother   . Breast cancer Sister   . Stomach cancer Sister   . Thyroid disease Neg Hx     Social History:  reports that she has  never smoked. She has never used smokeless tobacco. She reports that she does not drink alcohol or use drugs.  Allergies: No Known Allergies  Medications: Reviewed  Results for orders placed or performed during the hospital encounter of 04/20/16 (from the past 48 hour(s))  CBC     Status: Abnormal   Collection Time: 04/27/16  7:56 AM  Result Value Ref Range   WBC 6.6 4.0 - 10.5 K/uL   RBC 2.72 (L) 3.87 - 5.11 MIL/uL   Hemoglobin 9.9 (L) 12.0 - 15.0 g/dL   HCT 30.7 (L) 36.0 - 46.0 %   MCV 112.9 (H) 78.0 - 100.0 fL   MCH 36.4 (H) 26.0 - 34.0 pg   MCHC 32.2 30.0 - 36.0 g/dL   RDW 14.6 11.5 - 15.5 %   Platelets 383 150 - 400 K/uL  CBC     Status: Abnormal   Collection Time: 04/28/16  3:24 AM  Result Value Ref Range   WBC 8.0 4.0 - 10.5 K/uL   RBC 2.99 (L) 3.87 - 5.11 MIL/uL   Hemoglobin 10.5 (L) 12.0 - 15.0 g/dL   HCT 33.7 (L) 36.0 - 46.0 %   MCV 112.7 (H) 78.0 - 100.0 fL   MCH 35.1 (H) 26.0 - 34.0 pg   MCHC 31.2 30.0 - 36.0 g/dL   RDW 14.2 11.5 - 15.5 %  Platelets 421 (H) 150 - 400 K/uL    No results found.  RFF:[id number] except as listed in admit H&P  Blood pressure 114/60, pulse 91, temperature 98.8 F (37.1 C), temperature source Oral, resp. rate 20, weight 73.7 kg (162 lb 6.4 oz), SpO2 98 %.  PHYSICAL EXAM: Overall appearance:  Chronically ill elderly lady, unable to speak due to a fascia. Head:  Normocephalic, atraumatic. Ears: External ears look healthy. Nose: External nose is healthy in appearance. Internal nasal exam free of any lesions or obstruction. Oral Cavity/Pharynx:  There are no mucosal lesions or masses identified. Larynx/Hypopharynx: Deferred Neuro:  Expressive aphasia, unable to evaluate other neurologic system. Neck: No palpable neck masses.  Studies Reviewed: CT neck and chest.  Procedures: none   Assessment/Plan: Large substernal goiter. It really nothing palpable in the neck. She has had this for many years. While it is displacing the  esophagus, it does not appear to be causing functional obstruction of the esophagus since most of the time she seems to swallow without difficulty. She is not aspirating. Recommendation was made by SLP to have her take small bites and wash it down with liquid. This will be very important to have a daughter monitor and make sure she complies with this. If this gets much worse, then she may need a feeding tube and possibly tracheostomy. I agree with Dr. Lynford Barrett that surgery would be a very significant and risky procedure, probably requiring thoracic surgery assistance. We'll be available if any additional issues arise.  Sandy Barrett 04/28/2016, 12:55 PM

## 2016-04-29 ENCOUNTER — Ambulatory Visit: Payer: Medicare Other | Admitting: Podiatry

## 2016-04-29 LAB — CBC
HCT: 30.2 % — ABNORMAL LOW (ref 36.0–46.0)
Hemoglobin: 9.9 g/dL — ABNORMAL LOW (ref 12.0–15.0)
MCH: 37.1 pg — ABNORMAL HIGH (ref 26.0–34.0)
MCHC: 32.8 g/dL (ref 30.0–36.0)
MCV: 113.1 fL — ABNORMAL HIGH (ref 78.0–100.0)
Platelets: 417 K/uL — ABNORMAL HIGH (ref 150–400)
RBC: 2.67 MIL/uL — ABNORMAL LOW (ref 3.87–5.11)
RDW: 14.6 % (ref 11.5–15.5)
WBC: 6.6 K/uL (ref 4.0–10.5)

## 2016-04-29 MED ORDER — GUAIFENESIN ER 600 MG PO TB12: 600.0000 mg | ORAL_TABLET | Freq: Two times a day (BID) | ORAL | 0 refills | Status: DC

## 2016-04-29 MED ORDER — IPRATROPIUM-ALBUTEROL 0.5-2.5 (3) MG/3ML IN SOLN: 3.0000 mL | Freq: Four times a day (QID) | RESPIRATORY_TRACT | 0 refills | Status: DC | PRN

## 2016-04-29 NOTE — Progress Notes (Signed)
Pt family call nurse to assess pt right side of her arm, pt arms was swollen and tele box was wedge in between pt underarm, pt family ask to take pt off tele monitoring to provide comfort for pt. Waiting MD order to get tele discontinue

## 2016-04-29 NOTE — Progress Notes (Signed)
NURSING PROGRESS NOTE  Sandy Barrett [id number] Discharge Data: 04/29/2016 4:24 PM Attending Provider: Hosie Poisson, MD YOV:Sandy J Burns, MD     Sandy Barrett to be D/C'd Home per MD order.  Discussed with the patient's daughter Sandy Barrett the After Visit Summary and all questions fully answered. All IV's discontinued with no bleeding noted. All belongings returned to patient for patient to take home. Pt will be transported by EMS home.  Last Vital Signs:  Blood pressure 109/79, pulse (!) 108, temperature 98.2 F (36.8 C), temperature source Oral, resp. rate 20, weight 74.8 kg (164 lb 12.8 oz), SpO2 93 %.  Discharge Medication List Allergies as of 04/29/2016   No Known Allergies     Medication List    STOP taking these medications   cephALEXin 250 MG capsule Commonly known as:  KEFLEX   fluconazole 150 MG tablet Commonly known as:  DIFLUCAN     TAKE these medications   acetaminophen 160 MG/5ML liquid Commonly known as:  TYLENOL Take 480 mg by mouth every 4 (four) hours as needed for fever or pain.   albuterol 108 (90 Base) MCG/ACT inhaler Commonly known as:  PROVENTIL HFA Inhale 2 puffs into the lungs every 6 (six) hours as needed for wheezing or shortness of breath.   ALIGN 4 MG Caps Take 1 capsule by mouth daily.   amoxicillin-clavulanate 875-125 MG tablet Commonly known as:  AUGMENTIN Take 1 tablet by mouth 2 (two) times daily. For 1days What changed:  additional instructions   apixaban 2.5 MG Tabs tablet Commonly known as:  ELIQUIS Take 1 tablet (2.5 mg total) by mouth 2 (two) times daily.   diclofenac sodium 1 % Gel Commonly known as:  VOLTAREN APPLY (2GMS) TOPICALLY THREE TIMES DAILY.   gabapentin 100 MG capsule Commonly known as:  NEURONTIN Take 1 capsule (100 mg total) by mouth at bedtime.   guaiFENesin 600 MG 12 hr tablet Commonly known as:  MUCINEX Take 1 tablet (600 mg total) by mouth 2 (two) times daily.   hydroxyurea 500 MG capsule Commonly  known as:  HYDREA Take 2 capsules (1,000 mg total) by mouth daily. May take with food to minimize GI side effects. What changed:  how much to take  additional instructions   ipratropium-albuterol 0.5-2.5 (3) MG/3ML Soln Commonly known as:  DUONEB Take 3 mLs by nebulization every 6 (six) hours as needed.   methimazole 5 MG tablet Commonly known as:  TAPAZOLE TAKE 1 TABLET BY MOUTH TWICE WEEKLY What changed:  See the new instructions.   polyethylene glycol packet Commonly known as:  MIRALAX / GLYCOLAX TAKE 17G BY MOUTH TWICE A DAY What changed:  See the new instructions.   propranolol 10 MG tablet Commonly known as:  INDERAL TAKE 1 TABLET TWICE A DAY What changed:  See the new instructions.   PROTONIX 40 mg/20 mL Pack Generic drug:  pantoprazole sodium TAKE 20 MLS (40 MG TOTAL) BY MOUTH DAILY. What changed:  additional instructions   terconazole 0.4 % vaginal cream Commonly known as:  TERAZOL 7 Place 1 applicator vaginally at bedtime. What changed:  when to take this  reasons to take this   traMADol 50 MG tablet Commonly known as:  ULTRAM TAKE 1 TO 2 TABLETS BY MOUTH 3 TIMES A DAY AS NEEDED What changed:  See the new instructions.   traZODone 50 MG tablet Commonly known as:  DESYREL TAKE 1 TABLET (50 MG TOTAL) BY MOUTH AT BEDTIME AS NEEDED. FOR SLEEP  Durable Medical Equipment        Start     Ordered   04/29/16 1031  For home use only DME Nebulizer machine  Once    Question Answer Comment  Patient needs a nebulizer to treat with the following condition Aspiration pneumonia [facility])   Patient needs a nebulizer to treat with the following condition Respiratory failure ([place])      04/29/16 1030

## 2016-04-29 NOTE — Discharge Summary (Signed)
Physician Discharge Summary  Sandy Barrett WUJ:[id number] DOB: [birth year] DOA: 04/20/2016  PCP: [name], MD  Admit date: 04/20/2016 Discharge date: 04/29/2016  Time spent: 35 minutes  Recommendations for Outpatient Follow-up:  1. PCP Dr.Burns in 1 week 2. Endocrine Dr.Ellison in 1 week 3. Hospice services recommended at discharge   Discharge Diagnoses:  Principal Problem:   Aspiration PNA (pneumonia) Active Problems:   Hyperthyroidism   Essential hypertension   Aphasia S/P CVA   Spastic hemiparesis affecting dominant side (HCC), right   Dysphagia   Aspiration pneumonia (HCC)   Thyroid mass   Palliative care by specialist   Pressure injury of skin   Palliative care encounter   Encounter for hospice care discussion   Discharge Condition: improved  Diet recommendation: dysphagia 3 with thin liquids  Filed Weights   04/27/16 0500 04/28/16 0300 04/29/16 0549  Weight: 73.8 kg (162 lb 12.8 oz) 73.7 kg (162 lb 6.4 oz) 74.8 kg (164 lb 12.8 oz)    History of present illness:  83 y.o.femalewith medical history significant for 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 Found to have aspiration pneumonia and dysphagia  Hospital Course:  Aspiration pneumonia -has multifactorial dysphagia -due to advanced age, Debility, large MCA stroke in 2016 and large multinodular goitre is definitley not helping, on esophagram it is causing marked displacement of esophagus, and smooth stricture of the upper esophagus at level of thoracic inlet is likely due to external compression by substernal goiter -s/p SLP evaluation, recommended D3 diet now -treated with IV Zosyn  x8days then change to PO Augmentin for 2days to complete 10days of abx, nebs for pulm toilet,  NT suctioning PRN etc -see discussion on goitre below -I discussed the need for Palliative care discussions and limited options with dysphagia/aspiration pneumonia and limited options for multinodular goitre as discussed below -Palliative consulted for Goals of care, s/p family meeting, DNR, discussed need for consideration for Hospice. -stable at discharge, but always at risk if aspirating  Multinodular goitre -Repeat CT soft tissue neck,  shows only slight interval growth of large goiter, no significant change compared to 2017 she is not a good surgical candidate she has already seen Surgery[facility] Dr.Gerkin in 2017, I called and d/w Dr.Sean Loanne Drilling and he felt that due to her debilitated status she may not be able to manage isolation for 5days and radioactive iodine at this time, plan for outpatient FU with Dr.Ellison next week to decide on radio-active iodine option. -Last TSH and freeT4 on 04/01/16 were normal on current dose of Methimazole, resumed methimazole -family requested second surgical opinion for thyroidectomy despite being told numerous times that she is a very poor surgical candidate given her tenuous resp status, due to persistent family request asked [facility] for input, felt to be a poor surgical candidate by [facility] again, Dr.Cornett suggested ENT eval and hence ENT consult requested per family insistence, seen by Dr.Rosen  who felt that surgery would be very risky hence not recommended  Hyperkalemia -s/p one dose of Kayexalate -resolved  Hypertension -Currently controlled, stable -resumed propranolol  Large MCA stroke with Spastic hemiparesis (right sided), dysphagia and aphasia - bed/wheel chair bound at baseline -and h/o DVT and PE in 2016. -on eliquis at home,  Was briefly on lovenox , restarted home  eliquis  H/o  PE on eliquis at home -resumed eliquis  Thrombocytosis  Followed by Dr Waldon Merl -resumed hydroxyurea   Consultations:  Palliative medicine  Gen  surgery  ENT Dr.ROsen  Discharge Exam: Vitals:   04/28/16 2152 04/29/16 0549  BP: 92/70 (!) 100/56  Pulse: 99 (!) 104  Resp: 18 17  Temp: 98.1 F (36.7 C) 98.6 F (37 C)    General: alert, awake, aphasic Cardiovascular: S1S2/RRR Respiratory: few basilar ronchi  Discharge Instructions   Discharge Instructions    Discharge instructions    Complete by:  As directed    Follow up with PCP in one week , follow up with palliative care services as needed.     Current Discharge Medication List    START taking these medications   Details  gabapentin (NEURONTIN) 100 MG capsule Take 1 capsule (100 mg total) by mouth at bedtime.    guaiFENesin (MUCINEX) 600 MG 12 hr tablet Take 1 tablet (600 mg total) by mouth 2 (two) times daily. Qty: 10 tablet, Refills: 0    ipratropium-albuterol (DUONEB) 0.5-2.5 (3) MG/3ML SOLN Take 3 mLs by nebulization every 6 (six) hours as needed. Qty: 360 mL, Refills: 0      CONTINUE these medications which have CHANGED   Details  amoxicillin-clavulanate (AUGMENTIN) 875-125 MG tablet Take 1 tablet by mouth 2 (two) times daily. For 1days Qty: 2 tablet, Refills: 0   Associated Diagnoses: Community acquired pneumonia, unspecified laterality      CONTINUE these medications which have NOT CHANGED   Details  acetaminophen (TYLENOL) 160 MG/5ML liquid Take 480 mg by mouth every 4 (four) hours as needed for fever or pain.    albuterol (PROVENTIL HFA) 108 (90 Base) MCG/ACT inhaler Inhale 2 puffs into the lungs every 6 (six) hours as needed for wheezing or shortness of breath. Qty: 1 Inhaler, Refills: 1   Associated Diagnoses: Cough    apixaban (ELIQUIS) 2.5 MG TABS tablet Take 1 tablet (2.5 mg total) by mouth 2 (two) times daily. Qty: 60 tablet, Refills: 2    diclofenac sodium (VOLTAREN) 1 % GEL APPLY (2GMS) TOPICALLY THREE TIMES DAILY. Qty: 300 g, Refills: 5    hydroxyurea (HYDREA) 500 MG capsule Take 2 capsules (1,000 mg total) by mouth daily. May  take with food to minimize GI side effects. Qty: 60 capsule, Refills: 9    methimazole (TAPAZOLE) 5 MG tablet TAKE 1 TABLET BY MOUTH TWICE WEEKLY Qty: 12 tablet, Refills: 2    polyethylene glycol (MIRALAX / GLYCOLAX) packet TAKE 17G BY MOUTH TWICE A DAY Qty: 14 packet, Refills: 1    Probiotic Product (ALIGN) 4 MG CAPS Take 1 capsule by mouth daily.     propranolol (INDERAL) 10 MG tablet TAKE 1 TABLET TWICE A DAY Qty: 60 tablet, Refills: 5    PROTONIX 40 MG PACK TAKE 20 MLS (40 MG TOTAL) BY MOUTH DAILY. Qty: 30 each, Refills: 5    terconazole (TERAZOL 7) 0.4 % vaginal cream Place 1 applicator vaginally at bedtime. Qty: 90 g, Refills: 3    traMADol (ULTRAM) 50 MG tablet TAKE 1 TO 2 TABLETS BY MOUTH 3 TIMES A DAY AS NEEDED Qty: 180 tablet, Refills: 1    traZODone (DESYREL) 50 MG tablet TAKE 1 TABLET (50 MG TOTAL) BY MOUTH AT BEDTIME AS NEEDED. FOR SLEEP Qty: 30 tablet, Refills: 5      STOP taking these medications     cephALEXin (KEFLEX) 250 MG capsule      fluconazole (DIFLUCAN)  150 MG tablet        No Known Allergies Follow-up Information    [name], MD. Schedule an appointment as soon as possible for a visit in 1 week(s).   Specialty:  Internal Medicine Contact information: [place] [address] [phone]        Renato Shin, MD. Schedule an appointment as soon as possible for a visit in 1 week(s).   Specialty:  Endocrinology Why:  to discuss radio-active iodine Contact information: [address] [place] [address] [phone]            The results of significant diagnostics from this hospitalization (including imaging, microbiology, ancillary and laboratory) are listed below for reference.    Significant Diagnostic Studies: Dg Chest 1 View  Result Date: 04/14/2016 CLINICAL DATA:  Chest congestion, cough, dysphagia for the past 5 days. History previous CVA, pulmonary embolism. Patient unable to raise the  chin above the chest. EXAM: CHEST 1 VIEW COMPARISON:  Portable chest x-ray of May 31, 2015 and chest CT scan of May 31, 2015. FINDINGS: Again demonstrated is a large soft tissue mass which deviates the trachea to the right most compatible with a goiter which is been demonstrated on previous chest x-ray and CT scan. The lungs are well-expanded. There are stable calcifications in the left upper hemithorax which lie in the soft tissues superficial to the scapula on the previous CT scan. The cardiac silhouette remains enlarged. The pulmonary vascularity is not engorged. There is calcification in the wall of the aortic arch. There is degenerative change of both shoulders. IMPRESSION: No acute pneumonia nor pulmonary edema.  Stable mild cardiomegaly. Thoracic aortic atherosclerosis. Large goiter with deviation of the trachea toward the right with narrowing of the tracheal lumen. This has progressed somewhat since the previous study. Electronically Signed   By: David  Martinique M.D.   On: 04/14/2016 08:28   Dg Chest 2 View  Result Date: 04/22/2016 CLINICAL DATA:  Acute onset of cough.  Initial encounter. EXAM: CHEST  2 VIEW COMPARISON:  Chest radiograph performed 04/20/2016 FINDINGS: The lungs are well-aerated. Right perihilar opacity is new from the study performed 2 days ago, and may reflect pneumonia. There is no evidence of pleural effusion or pneumothorax. Underlying vascular congestion is noted. As before, a very large thyroid goiter displaces the trachea to the right, with tracheal narrowing. This finding is stable from 2012. The heart is mildly enlarged. No acute osseous abnormalities are seen. Degenerative change is noted at the glenohumeral joints. IMPRESSION: 1. Right perihilar opacity is new from the study performed 2 days ago, and may reflect pneumonia. Underlying vascular congestion and mild cardiomegaly. 2. Very large thyroid goiter again noted, displacing the trachea to the right, with tracheal narrowing.  This is stable from 2012. Electronically Signed   By: Garald Balding M.D.   On: 04/22/2016 19:47   Dg Chest 2 View  Result Date: 04/20/2016 CLINICAL DATA:  Cough and shortness of breath. EXAM: CHEST  2 VIEW COMPARISON:  04/13/2016 and chest CT on 05/31/2015 FINDINGS: Heart size is stable. Large mediastinal mass resulting in tracheal narrowing and deviation to the right is stable, and consistent with large substernal goiter, as demonstrated on previous CT. No evidence of pulmonary infiltrate or edema. Limited visualization on lateral projection due to position of patient's arms. IMPRESSION: No acute findings. Stable large substernal goiter causing tracheal narrowing and deviation to the right. Electronically Signed   By: Sharrie Rothman.D.  On: 04/20/2016 10:13   Ct Soft Tissue Neck W Contrast  Result Date: 04/21/2016 CLINICAL DATA:  Continued surveillance multinodular goiter. EXAM: CT NECK WITH CONTRAST TECHNIQUE: Multidetector CT imaging of the neck was performed using the standard protocol following the bolus administration of intravenous contrast. CONTRAST:  13mL ISOVUE-300 IOPAMIDOL (ISOVUE-300) INJECTION 61% COMPARISON:  07/01/2015. FINDINGS: Pharynx and larynx: Normal pharyngeal structures. Severe rightward displacement of the proximal trachea and larynx due to the leftward dominant multinodular goiter. This may be increased from prior of June 2017. Salivary glands: No inflammation, mass, or stone. Thyroid: Markedly enlarged and heterogeneous thyroid, with multiple nodules and scattered calcifications, significant substernal extent. In addition to mass effect on the trachea, the SVC is displaced and effaced. Approximate measurements are 97 x 45 x 68 mm, although the entire craniocaudal extent is not assessed on this CT neck study. Lymph nodes: None enlarged or abnormal density. Vascular: Occluded LEFT ICA. Calcifications in the LEFT internal jugular are redemonstrated consistent with chronic thrombosis.  Limited intracranial: LEFT temporal lobe encephalomalacia better visualized on previous exam. Visualized orbits: Not visualized. Mastoids and visualized paranasal sinuses: Clear. Skeleton: No acute osseous findings. Cervical spondylosis. Nuchal ligament ossification. Upper chest: No pulmonary nodule or pneumothorax. Other: None. IMPRESSION: Slight interval growth since 2017 of large goiter. Significant LEFT-to-RIGHT mass effect on the trachea and SVC; see discussion above. Electronically Signed   By: Staci Righter M.D.   On: 04/21/2016 13:19   Dg Esophagus  Result Date: 04/23/2016 CLINICAL DATA:  Dysphagia.  Substernal goiter. EXAM: ESOPHOGRAM/BARIUM SWALLOW TECHNIQUE: Single contrast examination was performed using  thin barium. FLUOROSCOPY TIME:  Fluoroscopy Time: 1 minutes 48 seconds ; low dose pulsed fluoroscopy Radiation Exposure Index (if provided by the fluoroscopic device): 15.6 mGy Number of Acquired Spot Images: 0 COMPARISON:  None. FINDINGS: Technically difficult exam as patient could not drink from cup or straw, and thin barium was administered orally by syringe. The patient could only take small swallows of barium. A large substernal goiter is seen causing marked displacement of the esophagus to the right. A smooth stricture of the upper esophagus is seen at level thoracic inlet, likely due to external compression by substernal goiter. This does not obstruct the passage of thin barium. No evidence of distal esophageal stricture or hiatal hernia. Lack of primary peristalsis resulting in esophageal stasis. IMPRESSION: Technically difficult exam. Large substernal goiter causes marked displacement of esophagus to the right. Smooth stricture of the upper esophagus at level of thoracic inlet is likely due to external compression by substernal goiter. Electronically Signed   By: Earle Gell M.D.   On: 04/23/2016 14:45   Dg Swallowing Func-speech Pathology  Result Date: 04/21/2016 Objective Swallowing  Evaluation: Type of Study: MBS-Modified Barium Swallow Study Patient Details Name: Markia Kyer MRN: [id number] Date of Birth: May 29, 1932 Today's Date: 04/21/2016 Time: SLP Start Time (ACUTE ONLY): 1039-SLP Stop Time (ACUTE ONLY): 1053 SLP Time Calculation (min) (ACUTE ONLY): 14 min Past Medical History: Past Medical History: Diagnosis Date . ARTHRITIS  . Arthritis  . Diverticulosis  . HYPERTENSION  . HYPERTHYROIDISM  . Hyperthyroidism   s/p I-131 ablation 03/2011 of multinod goiter . INSOMNIA  . MIGRAINE HEADACHE  . OBESITY  . Posttraumatic stress disorder  . Pulmonary embolism ([place]) 03/2014  with DVT . Stroke [facility]) 03/2014  dysarthria Past Surgical History: Past Surgical History: Procedure Laterality Date . ABDOMINAL HYSTERECTOMY  1976 . BREAST SURGERY    biopsy . RADIOLOGY WITH ANESTHESIA Left 03/08/2014  Procedure: RADIOLOGY WITH ANESTHESIA;  Surgeon: Rob Hickman, MD;  Location: [place];  Service: Radiology;  Laterality: Left; . TONSILLECTOMY   . TOTAL HIP ARTHROPLASTY  1998   right HPI: Ptis a 83 y.o.femalewith medical history significant of hyperthyroidism, HTN, migraine headache, left MCA stroke with residual right sided weakness and aphasia, and PE who presented to the ED with c/o cough and wheezing. Patient seen by PCP on 04/13/2016 and diagnosed with CAP and UTI. CXR showed no acute findings, stable large substernal goiter causing tracheal narrowing and deviation to the right. No Data Recorded Assessment / Plan / Recommendation CHL IP CLINICAL IMPRESSIONS 04/21/2016 Clinical Impression Ms. Housewright presents with a mild oral/pharyngeal dysphagia, suspect primary esophageal dysphagia. Oral phase impairments included mild prolonged mastication with regular solids (pt wears partial dentures). Observed mild vallecular and pyriform sinus residue with thin liquids via cup/straw due to reduced laryngeal elevation and epiglottic inversion. MBS does not diagnose below the level of the UES however scan of esophagus  revealed stasis within the mid-esophagus and questionable stenosis. Pt previously diagnosed with a substernal goiter causing tracheal narrowing and deviation to the right possible causing observations. Educated pt re: results of MBS, diet recommendations, esophageal precautions (alternate solids/liquids, sit upright after meals), and advised pt to consider GI consult due to esophageal findings. Recommend Dys 3 solids, thin liquids (straws ok), meds crushed. ST will f/u briefly to assess diet tolerance and for continued education. SLP Visit Diagnosis Dysphagia, pharyngoesophageal phase (R13.14) Attention and concentration deficit following -- Frontal lobe and executive function deficit following -- Impact on safety and function Mild aspiration risk   CHL IP TREATMENT RECOMMENDATION 04/21/2016 Treatment Recommendations Therapy as outlined in treatment plan below   Prognosis 04/21/2016 Prognosis for Safe Diet Advancement Good Barriers to Reach Goals -- Barriers/Prognosis Comment -- CHL IP DIET RECOMMENDATION 04/21/2016 SLP Diet Recommendations Dysphagia 3 (Mech soft) solids;Thin liquid Liquid Administration via Cup;Straw Medication Administration Crushed with puree Compensations Slow rate;Small sips/bites;Follow solids with liquid Postural Changes Seated upright at 90 degrees   CHL IP OTHER RECOMMENDATIONS 04/21/2016 Recommended Consults Consider GI evaluation;Consider esophageal assessment Oral Care Recommendations Oral care BID Other Recommendations --   CHL IP FOLLOW UP RECOMMENDATIONS 04/21/2016 Follow up Recommendations Skilled Nursing facility;24 hour supervision/assistance   CHL IP FREQUENCY AND DURATION 04/21/2016 Speech Therapy Frequency (ACUTE ONLY) min 1 x/week Treatment Duration 1 week      CHL IP ORAL PHASE 04/21/2016 Oral Phase Impaired Oral - Pudding Teaspoon -- Oral - Pudding Cup -- Oral - Honey Teaspoon -- Oral - Honey Cup -- Oral - Nectar Teaspoon -- Oral - Nectar Cup -- Oral - Nectar Straw -- Oral - Thin  Teaspoon -- Oral - Thin Cup WFL Oral - Thin Straw WFL Oral - Puree -- Oral - Mech Soft -- Oral - Regular (No Data) Oral - Multi-Consistency -- Oral - Pill -- Oral Phase - Comment --  CHL IP PHARYNGEAL PHASE 04/21/2016 Pharyngeal Phase Impaired Pharyngeal- Pudding Teaspoon -- Pharyngeal -- Pharyngeal- Pudding Cup -- Pharyngeal -- Pharyngeal- Honey Teaspoon -- Pharyngeal -- Pharyngeal- Honey Cup -- Pharyngeal -- Pharyngeal- Nectar Teaspoon -- Pharyngeal -- Pharyngeal- Nectar Cup -- Pharyngeal -- Pharyngeal- Nectar Straw -- Pharyngeal -- Pharyngeal- Thin Teaspoon -- Pharyngeal -- Pharyngeal- Thin Cup Pharyngeal residue - valleculae;Pharyngeal residue - pyriform;Reduced laryngeal elevation;Reduced epiglottic inversion Pharyngeal -- Pharyngeal- Thin Straw Pharyngeal residue - valleculae;Pharyngeal residue - pyriform;Reduced laryngeal elevation;Reduced epiglottic inversion Pharyngeal -- Pharyngeal- Puree -- Pharyngeal -- Pharyngeal- Mechanical Soft -- Pharyngeal -- Pharyngeal- Regular Pharyngeal residue - valleculae;Reduced epiglottic  inversion;Reduced laryngeal elevation Pharyngeal -- Pharyngeal- Multi-consistency -- Pharyngeal -- Pharyngeal- Pill -- Pharyngeal -- Pharyngeal Comment --  CHL IP CERVICAL ESOPHAGEAL PHASE 04/21/2016 Cervical Esophageal Phase WFL Pudding Teaspoon -- Pudding Cup -- Honey Teaspoon -- Honey Cup -- Nectar Teaspoon -- Nectar Cup -- Nectar Straw -- Thin Teaspoon -- Thin Cup -- Thin Straw -- Puree -- Mechanical Soft -- Regular -- Multi-consistency -- Pill -- Cervical Esophageal Comment -- CHL IP GO 08/08/2015 Functional Assessment Tool Used mbs Functional Limitations Swallowing Swallow Current Status (D4081) CJ Swallow Goal Status (K4818) CJ Swallow Discharge Status (H6314) CJ Motor Speech Current Status (H7026) (None) Motor Speech Goal Status (V7858) (None) Motor Speech Goal Status (I5027) (None) Spoken Language Comprehension Current Status (X4128) (None) Spoken Language Comprehension Goal Status  (N8676) (None) Spoken Language Comprehension Discharge Status (H2094) (None) Spoken Language Expression Current Status (B0962) (None) Spoken Language Expression Goal Status (E3662) (None) Spoken Language Expression Discharge Status (H4765) (None) Attention Current Status (Y6503) (None) Attention Goal Status (T4656) (None) Attention Discharge Status (C1275) (None) Memory Current Status (T7001) (None) Memory Goal Status (V4944) (None) Memory Discharge Status (H6759) (None) Voice Current Status (F6384) (None) Voice Goal Status (Y6599) (None) Voice Discharge Status (J5701) (None) Other Speech-Language Pathology Functional Limitation Current Status (X7939) (None) Other Speech-Language Pathology Functional Limitation Goal Status (Q3009) (None) Other Speech-Language Pathology Functional Limitation Discharge Status [phone]) (None) [place] Siren 04/21/2016, 2:50 PM Orbie Pyo Litaker M.Ed [employer] [phone]               Microbiology: Recent Results (from the past 240 hour(s))  Culture, blood (routine x 2) Call MD if unable to obtain prior to antibiotics being given     Status: None   Collection Time: 04/20/16  7:35 PM  Result Value Ref Range Status   Specimen Description BLOOD LEFT ARM  Final   Special Requests IN PEDIATRIC BOTTLE BCAV  Final   Culture NO GROWTH 5 DAYS  Final   Report Status 04/25/2016 FINAL  Final  Culture, blood (routine x 2) Call MD if unable to obtain prior to antibiotics being given     Status: Abnormal   Collection Time: 04/20/16  7:37 PM  Result Value Ref Range Status   Specimen Description BLOOD RIGHT HAND  Final   Special Requests IN PEDIATRIC BOTTLE Blood Culture adequate volume  Final   Culture  Setup Time   Final    GRAM POSITIVE COCCI IN CLUSTERS IN PEDIATRIC BOTTLE CRITICAL RESULT CALLED TO, READ BACK BY AND VERIFIED WITH: CARON AMEND,PHARMD @0025  04/22/16 MKELLY,MLT    Culture (A)  Final    STAPHYLOCOCCUS SPECIES (COAGULASE NEGATIVE) THE SIGNIFICANCE OF  ISOLATING THIS ORGANISM FROM A SINGLE SET OF BLOOD CULTURES WHEN MULTIPLE SETS ARE DRAWN IS UNCERTAIN. PLEASE NOTIFY THE MICROBIOLOGY DEPARTMENT WITHIN ONE WEEK IF SPECIATION AND SENSITIVITIES ARE REQUIRED.    Report Status 04/23/2016 FINAL  Final     Labs: Basic Metabolic Panel:  Recent Labs Lab 04/23/16 0401 04/24/16 0424 04/25/16 0521  NA 142 144 143  K 2.3* 3.3* 4.0  CL 104 107 108  CO2 28 24 27   GLUCOSE 102* 98 82  BUN 5* 7 5*  CREATININE 0.55 0.38* 0.34*  CALCIUM 8.7* 8.9 8.7*   Liver Function Tests: No results for input(s): AST, ALT, ALKPHOS, BILITOT, PROT, ALBUMIN in the last 168 hours. No results for input(s): LIPASE, AMYLASE in the last 168 hours. No results for input(s): AMMONIA in the last 168 hours. CBC:  Recent Labs Lab 04/25/16 [phone]  04/26/16 0604 04/27/16 0756 04/28/16 0324 04/29/16 0642  WBC 7.4 8.5 6.6 8.0 6.6  HGB 12.1 10.6* 9.9* 10.5* 9.9*  HCT 37.2 33.3* 30.7* 33.7* 30.2*  MCV 112.4* 111.7* 112.9* 112.7* 113.1*  PLT 423* 419* 383 421* 417*   Cardiac Enzymes: No results for input(s): CKTOTAL, CKMB, CKMBINDEX, TROPONINI in the last 168 hours. BNP: BNP (last 3 results) No results for input(s): BNP in the last 8760 hours.  ProBNP (last 3 results) No results for input(s): PROBNP in the last 8760 hours.  CBG: No results for input(s): GLUCAP in the last 168 hours.     SignedHosie Poisson MD.  [facility] 04/29/2016, 10:30 AM

## 2016-04-29 NOTE — Progress Notes (Signed)
CM called [facility] for transportation to home. Arrival time for pick up will be within  1-1.5 hrs per [facility]. CM made nurse and family aware. Whitman Hero RN,BSN,CM

## 2016-04-29 NOTE — Care Management Note (Signed)
Case Management Note  Patient Details  Name: Sandy Barrett MRN: [id number] Date of Birth: November 16, 1932  Subjective/Objective:     Pt with medical history significant of Hyperthyroidism, HTN, migraine headache, left MCA stroke with residual right sided weakness and aphasia 2016,  PE admitted due to aspiration PNA.            PCP: Billey Gosling  Action/Plan: Plan is to d/c to home today with home health services ( RN,PT,SLP). Pt will need [facility] called  @ [phone] for transportation to home.  Expected Discharge Date:  04/29/16               Expected Discharge Plan:  [place]  In-House Referral:     Discharge planning Services  CM Consult  Post Acute Care Choice:    Choice offered to:  Adult Children  DME Arranged:  Nebulizer machine DME Agency:  [facility]. Referral made with Butch Penny @ [phone]    [facility] Arranged:  PT, RN, Speech Therapy  HH Agency:  [facility]/ Referral made with Butch Penny @ [phone]  Status of Service:  Completed, signed off  If discussed at [place] of Stay Meetings, dates discussed:    Additional Comments:  Sharin Mons, RN 04/29/2016, 1:32 PM

## 2016-04-30 ENCOUNTER — Inpatient Hospital Stay: Payer: Medicare Other | Admitting: Internal Medicine

## 2016-04-30 ENCOUNTER — Telehealth: Payer: Self-pay | Admitting: *Deleted

## 2016-04-30 ENCOUNTER — Telehealth: Payer: Self-pay | Admitting: Internal Medicine

## 2016-04-30 NOTE — Telephone Encounter (Signed)
Transition Care Management Follow-up Telephone Call   Date discharged? 04/29/16   How have you been since you were released from the hospital? Spoke w/daughter Sandy Barrett) she states mom is ok still weak from hospital stay   Do you understand why you were in the hospital? YES, pt was aware   Do you understand the discharge instructions? YES   Where were you discharged to? Home   Items Reviewed:  Medications reviewed: YES  Allergies reviewed: YES  Dietary changes reviewed: NO  Referrals reviewed: YES, also appt w/endo " Sandy Barrett" has already been made   Functional Questionnaire:   Activities of Daily Living (ADLs):   She states MOM are independent in the following: bathing and hygiene, feeding, grooming and toileting States She require assistance with the following: ambulation, bathing and hygiene and dressing   Any transportation issues/concerns?: NO   Any patient concerns? NO   Confirmed importance and date/time of follow-up visits scheduled YES, APPT 05/04/16  Provider Appointment booked with Sandy Barrett  Confirmed with patient if condition begins to worsen call PCP or go to the ER.  Patient was given the office number and encouraged to call back with question or concerns.  : YES

## 2016-04-30 NOTE — Telephone Encounter (Signed)
[facility] called. They would like the nurse to call them back. They wanted to inform that they will not being going out to see her due to what was found in the hospital stay. She would like to give the rest of the informing to the nurse once she calls back.   She was informed that Dr. Quay Burow was gone for the day and would be out of office.  [phone] - Alonna Minium

## 2016-05-01 DIAGNOSIS — I69320 Aphasia following cerebral infarction: Secondary | ICD-10-CM | POA: Diagnosis not present

## 2016-05-01 DIAGNOSIS — D510 Vitamin B12 deficiency anemia due to intrinsic factor deficiency: Secondary | ICD-10-CM | POA: Diagnosis not present

## 2016-05-01 DIAGNOSIS — I69391 Dysphagia following cerebral infarction: Secondary | ICD-10-CM | POA: Diagnosis not present

## 2016-05-01 DIAGNOSIS — I10 Essential (primary) hypertension: Secondary | ICD-10-CM | POA: Diagnosis not present

## 2016-05-01 DIAGNOSIS — I69351 Hemiplegia and hemiparesis following cerebral infarction affecting right dominant side: Secondary | ICD-10-CM | POA: Diagnosis not present

## 2016-05-01 DIAGNOSIS — R1310 Dysphagia, unspecified: Secondary | ICD-10-CM | POA: Diagnosis not present

## 2016-05-02 DIAGNOSIS — I69391 Dysphagia following cerebral infarction: Secondary | ICD-10-CM | POA: Diagnosis not present

## 2016-05-02 DIAGNOSIS — I69320 Aphasia following cerebral infarction: Secondary | ICD-10-CM | POA: Diagnosis not present

## 2016-05-02 DIAGNOSIS — R1310 Dysphagia, unspecified: Secondary | ICD-10-CM | POA: Diagnosis not present

## 2016-05-02 DIAGNOSIS — I10 Essential (primary) hypertension: Secondary | ICD-10-CM | POA: Diagnosis not present

## 2016-05-02 DIAGNOSIS — D510 Vitamin B12 deficiency anemia due to intrinsic factor deficiency: Secondary | ICD-10-CM | POA: Diagnosis not present

## 2016-05-02 DIAGNOSIS — I69351 Hemiplegia and hemiparesis following cerebral infarction affecting right dominant side: Secondary | ICD-10-CM | POA: Diagnosis not present

## 2016-05-03 NOTE — Progress Notes (Signed)
Subjective:    Patient ID: Sandy Barrett, female    DOB: 11/06/1932, 83 y.o.   MRN: [id number]  HPI The patient is here for follow up from the hospital.  Admitted 04/21/46-04/29/16 for aspiration pneumonia.   She was taken to the hospital for a cough that had been persistent for over one week and getting worse.   She had intermittent wheezing.   A CXR showed large stable goiter causing tracheal narrowing and deviation to the right, haziness in the lower lung field and blunting of the left costophrenic angle.  She was admitted for aspiration pneumonia.   Aspiration pneumonia:  She has multifactorial dysphagia (h/o CVA, debility, age, goiter compressing her esophagus.   A swallowing evaluation with speech was done and she is recommended to be on a dysphagia 3 diet.  She was treated with IV Zosyn x 8 days and Augmentin 2 days, neb treatments, NT suctioning as needed.    Multinodular goiter:   A repeat Ct scan of her neck shows slight growth, but no significant change.  She is not a good surgical candidate - she saw surgery in 2017 and has discussed with Dr Loanne Drilling.  She is not eligible for radioactive iodine due to not being able to care for herself and be isolated after treatment.  Her thyroid function is in the normal range.  She had second opinions regarding surgery while in the hospital from surgery and ENT.  Both felt she was a poor candidate for surgery.    Her chronic medical problems were stable during her hospitalization.  Palliative care was consulted and hospice was recommended.    She was started on gabapentin, mucinex and duoneb as needed.  She is here with her daughter today.   Aspiration PNA:  She completed the antibiotic. Her daughter denies cough, wheeze, SOB and fevers.   She has not had any coughing spells since being home.    Dysphagia:  She is eating well and eating 75% of her food.  She has not had any coughing spells.  She is eating well and is tolerating the diet.     Yesterday her daughter noticed a rash in her arm and thorax bilaterally. She is not itching the rash. It does not appear to hurt in any way.  There is no other rash throughout her body.    Bedsores:  She has a couple of developing bed sores since being in the hospital. Her daughter is treating them and there has been some improvement.  She has a visiting nurse coming out to the house this week.    She does have abdominal bruising the lovenox injection.  It is tender and causes her some discomfort.     Medications and allergies reviewed with patient and updated if appropriate.  Patient Active Problem List   Diagnosis Date Noted  . Palliative care encounter   . Encounter for hospice care discussion   . Pressure injury of skin 04/27/2016  . Thyroid mass   . Palliative care by specialist   . PNA (pneumonia) 04/20/2016  . Aspiration pneumonia ([place]) 04/20/2016  . Hemidiaphragm paralysis   . Wheelchair bound 02/03/2016  . Conjunctival hemorrhage of right eye 01/28/2016  . Ecchymosis 01/28/2016  . Vaginal bleeding 01/28/2016  . Gross hematuria 01/28/2016  . Supratherapeutic INR 01/28/2016  . Excessive cerumen in ear canal, bilateral 12/31/2015  . Leg swelling 12/31/2015  . Discoloration of skin 09/26/2015  . Dermatitis 09/13/2015  . Essential thrombocytosis ([place]) 07/31/2015  .  SVC (superior vena cava obstruction), chronic 07/20/2015  . Dysphagia 07/20/2015  . Elevated liver enzymes 07/20/2015  . Constipation 07/20/2015  . Thrombophlebitis of breast, right 06/18/2015  . Tracheal deviation 06/06/2015  . Sensorineural hearing loss, bilateral, moderate-moderately severe 04/30/2015  . Abdominal wall lump 03/09/2015  . Frequent UTI 02/06/2015  . Primary osteoarthritis involving multiple joints 07/26/2014  . Pernicious anemia 07/26/2014  . Hearing loss 07/26/2014  . Encounter for therapeutic drug monitoring 07/17/2014  . Spastic hemiparesis affecting dominant side ([place]), right  05/31/2014  . DVT of lower extremity, bilateral ([place]) 03/14/2014  . Global aphasia 03/14/2014  . 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 Aphasia S/P CVA 03/13/2014  . Cerebral infarction due to embolism of left middle cerebral artery ([place])   . Stroke, embolic ([place]) 08/18/4816  . History of pulmonary embolism 03/08/2014  . Back pain 08/29/2013  . Primary localized osteoarthrosis, lower leg 03/06/2013  . IBS (irritable bowel syndrome)   . Multinodular goiter 01/13/2011  . Hyperthyroidism 11/11/2009  . Insomnia 07/03/2009  . POSTTRAUMATIC STRESS DISORDER 08/22/2008  . OBESITY 04/21/2008  . Osteoarthritis 04/21/2008  . MIGRAINE HEADACHE 04/20/2008  . Essential hypertension 04/20/2008    Current Outpatient Prescriptions on File Prior to Visit  Medication Sig Dispense Refill  . acetaminophen (TYLENOL) 160 MG/5ML liquid Take 480 mg by mouth every 4 (four) hours as needed for fever or pain.    [name] albuterol (PROVENTIL HFA) 108 (90 Base) MCG/ACT inhaler Inhale 2 puffs into the lungs every 6 (six) hours as needed for wheezing or shortness of breath. 1 Inhaler 1  . amoxicillin-clavulanate (AUGMENTIN) 875-125 MG tablet Take 1 tablet by mouth 2 (two) times daily. For 1days 2 tablet 0  . apixaban (ELIQUIS) 2.5 MG TABS tablet Take 1 tablet (2.5 mg total) by mouth 2 (two) times daily. 60 tablet 2  . diclofenac sodium (VOLTAREN) 1 % GEL APPLY (2GMS) TOPICALLY THREE TIMES DAILY. 300 g 5  . gabapentin (NEURONTIN) 100 MG capsule Take 1 capsule (100 mg total) by mouth at bedtime.    [name] guaiFENesin (MUCINEX) 600 MG 12 hr tablet Take 1 tablet (600 mg total) by mouth 2 (two) times daily. 10 tablet 0  . hydroxyurea (HYDREA) 500 MG capsule Take 2 capsules (1,000 mg total) by mouth daily. May take with food to minimize GI side effects. (Patient taking differently: Take 500 mg by mouth daily. ) 60 capsule 9  . ipratropium-albuterol (DUONEB) 0.5-2.5 (3) MG/3ML SOLN Take 3 mLs by nebulization every 6 (six) hours as  needed. 360 mL 0  . methimazole (TAPAZOLE) 5 MG tablet TAKE 1 TABLET BY MOUTH TWICE WEEKLY (Patient taking differently: TAKE 1 TABLET BY MOUTH TWICE WEEKLY (Sun and Thurs)) 12 tablet 2  . polyethylene glycol (MIRALAX / GLYCOLAX) packet TAKE 17G BY MOUTH TWICE A DAY (Patient taking differently: TAKE 17G BY MOUTH TWICE A DAY as needed) 14 packet 1  . Probiotic Product (ALIGN) 4 MG CAPS Take 1 capsule by mouth daily.     . propranolol (INDERAL) 10 MG tablet TAKE 1 TABLET TWICE A DAY (Patient taking differently: TAKE 1 TABLET once A DAY) 60 tablet 5  . PROTONIX 40 MG PACK TAKE 20 MLS (40 MG TOTAL) BY MOUTH DAILY. (Patient taking differently: Take 40 mg by mouth daily. Mix with applesauce) 30 each 5  . terconazole (TERAZOL 7) 0.4 % vaginal cream Place 1 applicator vaginally at bedtime. (Patient taking differently: Place 1 applicator vaginally at bedtime as needed (irritation). ) 90 g 3  .  traMADol (ULTRAM) 50 MG tablet TAKE 1 TO 2 TABLETS BY MOUTH 3 TIMES A DAY AS NEEDED (Patient taking differently: TAKE 2 TABLETS BY MOUTH 3 TIMES A DAY AS NEEDED) 180 tablet 1  . traZODone (DESYREL) 50 MG tablet TAKE 1 TABLET (50 MG TOTAL) BY MOUTH AT BEDTIME AS NEEDED. FOR SLEEP 30 tablet 5   No current facility-administered medications on file prior to visit.     Past Medical History:  Diagnosis Date  . ARTHRITIS   . Arthritis   . Diverticulosis   . HYPERTENSION   . HYPERTHYROIDISM   . Hyperthyroidism    s/p I-131 ablation 03/2011 of multinod goiter  . INSOMNIA   . MIGRAINE HEADACHE   . OBESITY   . Posttraumatic stress disorder   . Pulmonary embolism ([place]) 03/2014   with DVT  . Stroke [facility]) 03/2014   dysarthria    Past Surgical History:  Procedure Laterality Date  . ABDOMINAL HYSTERECTOMY  1976  . BREAST SURGERY     biopsy  . RADIOLOGY WITH ANESTHESIA Left 03/08/2014   Procedure: RADIOLOGY WITH ANESTHESIA;  Surgeon: Rob Hickman, MD;  Location: [facility];  Service: Radiology;  Laterality: Left;  .  TONSILLECTOMY    . TOTAL HIP ARTHROPLASTY  1998    right    Social History   Social History  . Marital status: Widowed    Spouse name: N/A  . Number of children: 2  . Years of education: 14   Occupational History  . retired     Restaurant manager, fast food   Social History Main Topics  . Smoking status: Never Smoker  . Smokeless tobacco: Never Used  . Alcohol use No  . Drug use: No  . Sexual activity: No   Other Topics Concern  . Not on file   Social History Narrative   Widowed, lived alone prior to CVA 03/2014   SNF at [facility], then home 07/2014 with dtr   Right handed   Caffeine use- occasionally drinks tea    Family History  Problem Relation Age of Onset  . Asthma Mother   . Asthma Father   . Prostate cancer Father   . Stroke Brother   . Breast cancer Sister   . Stomach cancer Sister   . Thyroid disease Neg Hx     Review of Systems  Constitutional: Positive for diaphoresis (at night). Negative for fever.  Respiratory: Negative for cough, shortness of breath and wheezing.   Gastrointestinal: Positive for abdominal pain (from bruising in abdominal surface) and constipation. Negative for diarrhea.  Skin: Positive for wound (skin breakdown from hospital).       Objective:   Vitals:   05/04/16 1325  BP: 102/64  Pulse: 90  Resp: 16  Temp: 98.3 F (36.8 C)   Wt Readings from Last 3 Encounters:  04/29/16 164 lb 12.8 oz (74.8 kg)  04/13/16 176 lb (79.8 kg)  12/31/15 186 lb (84.4 kg)   There is no height or weight on file to calculate BMI.   Physical Exam    Constitutional: Appears well-developed and well-nourished. No distress.  HENT: right eye with subconjunctival hemorrhage Head: Normocephalic and atraumatic.  Neck: Neck supple. Thyromegaly present.  No cervical lymphadenopathy Cardiovascular: Normal rate, regular rhythm and normal heart sounds.   No murmur heard. No carotid bruit .  Mild LE edema Pulmonary/Chest: Effort normal and breath sounds  normal. No respiratory distress. No has no wheezes. No rales.  Abdomen: soft, non distended, mildly tender  due to bruising Skin: Skin is warm and dry. Not diaphoretic. Bruising on abdomen that is indurated and tender, blister rash with surrounding erythema on b/l axillas that extend slightly down arm and chest wall - non - tender, no open wound or discharge.  Did not look at pressure ulcer - visiting nurse scheduled Psychiatric: Normal mood and affect. Behavior is normal.    CXR 04/22/16: IMPRESSION: 1. Right perihilar opacity is new from the study performed 2 days ago, and may reflect pneumonia. Underlying vascular congestion and mild cardiomegaly. 2. Very large thyroid goiter again noted, displacing the trachea to the right, with tracheal narrowing. This is stable from 2012.  DG Esophagus  04/22/16:  IMPRESSION: Technically difficult exam. Large substernal goiter causes marked displacement of esophagus to the right. Smooth stricture of the upper esophagus at level of thoracic inlet is likely due to external compression by substernal goiter.  Ct Neck 04/21/16: IMPRESSION: Slight interval growth since 2017 of large goiter. Significant LEFT-to-RIGHT mass effect on the trachea and SVC; see discussion above.  Swallowing evaluation  04/21/16:  Dysphagia, pharyngoesophageal phase Recommend Dys 3 solids, thin liquids (straws ok), meds crushed.  CXR 04/20/16: IMPRESSION: No acute findings. Stable large substernal goiter causing tracheal narrowing and deviation to the right.  CMP 04-25-16    Component Value Date/Time   NA 143 04/25/2016 0521   K 4.0 04/25/2016 0521   CL 108 04/25/2016 0521   CO2 27 04/25/2016 0521   GLUCOSE 82 04/25/2016 0521   BUN 5 (L) 04/25/2016 0521   CREATININE 0.34 (L) 04/25/2016 0521   CALCIUM 8.7 (L) 04/25/2016 0521   PROT 6.6 01/26/2016 2115   ALBUMIN 3.4 (L) 01/26/2016 2115   AST 16 01/26/2016 2115   ALT 13 (L) 01/26/2016 2115   ALKPHOS 61 01/26/2016 2115     BILITOT 0.5 01/26/2016 2115   GFRNONAA >60 04/25/2016 0521   GFRAA >60 04/25/2016 0521   CBC  04-29-16    Component Value Date/Time   WBC 6.6 04/29/2016 0642   RBC 2.67 (L) 04/29/2016 0642   HGB 9.9 (L) 04/29/2016 0642   HGB 14.1 02/07/2016 1321   HCT 30.2 (L) 04/29/2016 0642   HCT 42.2 02/07/2016 1321   PLT 417 (H) 04/29/2016 0642   PLT 420 (H) 02/07/2016 1321   MCV 113.1 (H) 04/29/2016 0642   MCV 108.5 (H) 02/07/2016 1321   MCH 37.1 (H) 04/29/2016 0642   MCHC 32.8 04/29/2016 0642   RDW 14.6 04/29/2016 0642   RDW 15.1 (H) 02/07/2016 1321   LYMPHSABS 1.5 04/20/2016 0945   LYMPHSABS 1.0 02/07/2016 1321   MONOABS 0.3 04/20/2016 0945   MONOABS 0.4 02/07/2016 1321   EOSABS 0.2 04/20/2016 0945   EOSABS 0.1 02/07/2016 1321   BASOSABS 0.1 04/20/2016 0945   BASOSABS 0.1 02/07/2016 1321    Assessment & Plan:    See Problem List for Assessment and Plan of chronic medical problems.

## 2016-05-04 ENCOUNTER — Encounter: Payer: Self-pay | Admitting: Internal Medicine

## 2016-05-04 ENCOUNTER — Ambulatory Visit (INDEPENDENT_AMBULATORY_CARE_PROVIDER_SITE_OTHER): Payer: Medicare Other | Admitting: Internal Medicine

## 2016-05-04 VITALS — BP 102/64 | HR 90 | Temp 98.3°F | Resp 16

## 2016-05-04 DIAGNOSIS — E042 Nontoxic multinodular goiter: Secondary | ICD-10-CM | POA: Diagnosis not present

## 2016-05-04 DIAGNOSIS — Z8701 Personal history of pneumonia (recurrent): Secondary | ICD-10-CM | POA: Diagnosis not present

## 2016-05-04 DIAGNOSIS — R1310 Dysphagia, unspecified: Secondary | ICD-10-CM | POA: Diagnosis not present

## 2016-05-04 DIAGNOSIS — L8990 Pressure ulcer of unspecified site, unspecified stage: Secondary | ICD-10-CM

## 2016-05-04 DIAGNOSIS — L740 Miliaria rubra: Secondary | ICD-10-CM | POA: Insufficient documentation

## 2016-05-04 DIAGNOSIS — J690 Pneumonitis due to inhalation of food and vomit: Secondary | ICD-10-CM

## 2016-05-04 MED ORDER — TRAMADOL HCL 50 MG PO TABS: ORAL_TABLET | ORAL | 1 refills | Status: DC

## 2016-05-04 NOTE — Telephone Encounter (Signed)
LVM with Ebony Hail to call back and discuss

## 2016-05-04 NOTE — Progress Notes (Signed)
Pre visit review using our clinic review tool, if applicable. No additional management support is needed unless otherwise documented below in the visit note. 

## 2016-05-04 NOTE — Assessment & Plan Note (Signed)
Occurred during hospital stay  daughter is treating and has been able to avoid pressure ulcers  Visiting nurse scheduled

## 2016-05-04 NOTE — Assessment & Plan Note (Signed)
Rash consistent with a heat rash Asymptomatic No treatment needed Will keep the area dry Call if no improvement

## 2016-05-04 NOTE — Assessment & Plan Note (Signed)
Completed antibiotic Resolved No evidence of pneumonia on exam No further imaging needed Her daughter will monitor for symptoms suggestive of pneumonia

## 2016-05-04 NOTE — Patient Instructions (Addendum)
   Medications reviewed and updated.  No changes recommended at this time.  Your prescription(s) have been submitted to your pharmacy. Please take as directed and contact our office if you believe you are having problem(s) with the medication(s).  A referral was ordered for endocrine  Please followup in 4 months

## 2016-05-04 NOTE — Assessment & Plan Note (Addendum)
Would like another opinion regarding RAI treatment for her mother's goiter Not a surgical candidate Will refer to Dr Cruzita Lederer

## 2016-05-04 NOTE — Assessment & Plan Note (Signed)
Had swallowing evaluation in hosp 04/2016 - likely multifactorial - CVA, debility and goiter compression on esophagus Dysphagia 3 diet recommended and she is doing well with it High risk of aspiration - her daughter will monitor closely - no recent coughing

## 2016-05-05 DIAGNOSIS — I69320 Aphasia following cerebral infarction: Secondary | ICD-10-CM | POA: Diagnosis not present

## 2016-05-05 DIAGNOSIS — I10 Essential (primary) hypertension: Secondary | ICD-10-CM | POA: Diagnosis not present

## 2016-05-05 DIAGNOSIS — R1310 Dysphagia, unspecified: Secondary | ICD-10-CM | POA: Diagnosis not present

## 2016-05-05 DIAGNOSIS — I69351 Hemiplegia and hemiparesis following cerebral infarction affecting right dominant side: Secondary | ICD-10-CM | POA: Diagnosis not present

## 2016-05-05 DIAGNOSIS — I69391 Dysphagia following cerebral infarction: Secondary | ICD-10-CM | POA: Diagnosis not present

## 2016-05-05 DIAGNOSIS — D510 Vitamin B12 deficiency anemia due to intrinsic factor deficiency: Secondary | ICD-10-CM | POA: Diagnosis not present

## 2016-05-07 ENCOUNTER — Ambulatory Visit: Payer: Medicare Other | Admitting: Endocrinology

## 2016-05-08 ENCOUNTER — Ambulatory Visit: Payer: Medicare Other | Admitting: Physical Medicine & Rehabilitation

## 2016-05-08 DIAGNOSIS — I69351 Hemiplegia and hemiparesis following cerebral infarction affecting right dominant side: Secondary | ICD-10-CM | POA: Diagnosis not present

## 2016-05-08 DIAGNOSIS — Z7901 Long term (current) use of anticoagulants: Secondary | ICD-10-CM | POA: Diagnosis not present

## 2016-05-08 DIAGNOSIS — I69320 Aphasia following cerebral infarction: Secondary | ICD-10-CM | POA: Diagnosis not present

## 2016-05-08 DIAGNOSIS — H9190 Unspecified hearing loss, unspecified ear: Secondary | ICD-10-CM | POA: Diagnosis not present

## 2016-05-08 DIAGNOSIS — M1990 Unspecified osteoarthritis, unspecified site: Secondary | ICD-10-CM | POA: Diagnosis not present

## 2016-05-08 DIAGNOSIS — I69391 Dysphagia following cerebral infarction: Secondary | ICD-10-CM | POA: Diagnosis not present

## 2016-05-08 DIAGNOSIS — I10 Essential (primary) hypertension: Secondary | ICD-10-CM | POA: Diagnosis not present

## 2016-05-08 DIAGNOSIS — R1310 Dysphagia, unspecified: Secondary | ICD-10-CM | POA: Diagnosis not present

## 2016-05-08 DIAGNOSIS — F4310 Post-traumatic stress disorder, unspecified: Secondary | ICD-10-CM | POA: Diagnosis not present

## 2016-05-08 DIAGNOSIS — D510 Vitamin B12 deficiency anemia due to intrinsic factor deficiency: Secondary | ICD-10-CM | POA: Diagnosis not present

## 2016-05-11 ENCOUNTER — Telehealth: Payer: Self-pay | Admitting: Internal Medicine

## 2016-05-11 NOTE — Telephone Encounter (Signed)
Spoke with Maudie Mercury to give verbal orders. She mentioned that she thinks the pt and the pts daughters would benefit from getting and aid through the state to help with caring for the pt. I do believe this is through medicaid. Are you okay with this?

## 2016-05-11 NOTE — Telephone Encounter (Signed)
[name] from [facility] called needing verbal orders for OT evaluation and also for a home health aid to be able to go out to the pts home.

## 2016-05-11 NOTE — Telephone Encounter (Signed)
Yes, ok with an aid

## 2016-05-12 DIAGNOSIS — I10 Essential (primary) hypertension: Secondary | ICD-10-CM | POA: Diagnosis not present

## 2016-05-12 DIAGNOSIS — R1310 Dysphagia, unspecified: Secondary | ICD-10-CM | POA: Diagnosis not present

## 2016-05-12 DIAGNOSIS — I69320 Aphasia following cerebral infarction: Secondary | ICD-10-CM | POA: Diagnosis not present

## 2016-05-12 DIAGNOSIS — I69351 Hemiplegia and hemiparesis following cerebral infarction affecting right dominant side: Secondary | ICD-10-CM | POA: Diagnosis not present

## 2016-05-12 DIAGNOSIS — I69391 Dysphagia following cerebral infarction: Secondary | ICD-10-CM | POA: Diagnosis not present

## 2016-05-12 DIAGNOSIS — D510 Vitamin B12 deficiency anemia due to intrinsic factor deficiency: Secondary | ICD-10-CM | POA: Diagnosis not present

## 2016-05-14 ENCOUNTER — Telehealth: Payer: Self-pay | Admitting: Emergency Medicine

## 2016-05-14 DIAGNOSIS — I10 Essential (primary) hypertension: Secondary | ICD-10-CM | POA: Diagnosis not present

## 2016-05-14 DIAGNOSIS — I69351 Hemiplegia and hemiparesis following cerebral infarction affecting right dominant side: Secondary | ICD-10-CM | POA: Diagnosis not present

## 2016-05-14 DIAGNOSIS — D510 Vitamin B12 deficiency anemia due to intrinsic factor deficiency: Secondary | ICD-10-CM | POA: Diagnosis not present

## 2016-05-14 DIAGNOSIS — I69391 Dysphagia following cerebral infarction: Secondary | ICD-10-CM | POA: Diagnosis not present

## 2016-05-14 DIAGNOSIS — R1310 Dysphagia, unspecified: Secondary | ICD-10-CM | POA: Diagnosis not present

## 2016-05-14 DIAGNOSIS — I69320 Aphasia following cerebral infarction: Secondary | ICD-10-CM | POA: Diagnosis not present

## 2016-05-14 NOTE — Telephone Encounter (Signed)
Received VM from pts daughter. States that the pts stool is still an orange color, she is unsure how long she should give this before it is a major concern. Also states that her left leg is swollen, they have been elevating her leg and have been icing it. Does not to seem to be causing pt any kind of discomfort. Please advise.

## 2016-05-14 NOTE — Telephone Encounter (Signed)
Spoke with Ebony Hail and states that pt was released from the hospital with Speech Therapy orders and only nursing would be seeing pt due to the pt have a goiter, speech therapy would not be able to help with this particular issue. Daughter is aware.

## 2016-05-14 NOTE — Telephone Encounter (Signed)
Spoke with pts daughter, advised her to keep a watch on both issues and to call back if things got worse. Advised to give pt plenty of water.

## 2016-05-14 NOTE — Telephone Encounter (Signed)
I do not recall discuss either - probably needs to be seen.

## 2016-05-15 DIAGNOSIS — I69320 Aphasia following cerebral infarction: Secondary | ICD-10-CM | POA: Diagnosis not present

## 2016-05-15 DIAGNOSIS — I69351 Hemiplegia and hemiparesis following cerebral infarction affecting right dominant side: Secondary | ICD-10-CM | POA: Diagnosis not present

## 2016-05-15 DIAGNOSIS — I10 Essential (primary) hypertension: Secondary | ICD-10-CM | POA: Diagnosis not present

## 2016-05-15 DIAGNOSIS — I69391 Dysphagia following cerebral infarction: Secondary | ICD-10-CM | POA: Diagnosis not present

## 2016-05-15 DIAGNOSIS — D510 Vitamin B12 deficiency anemia due to intrinsic factor deficiency: Secondary | ICD-10-CM | POA: Diagnosis not present

## 2016-05-15 DIAGNOSIS — R1310 Dysphagia, unspecified: Secondary | ICD-10-CM | POA: Diagnosis not present

## 2016-05-16 NOTE — Progress Notes (Signed)
Subjective:    Patient ID: Sandy Barrett, female    DOB: 1932/07/22, 83 y.o.   MRN: [id number]  HPI She is here for an acute visit.   Orange colored stool:  Her stools have been orange since she left the hospital it is getting better.  She denies any red or blood in her stool.  The visiting home nurse looked the stool as well and did not think it was blood.  She does not think she has had any abdominal discomfort.  Her stools are fairly normal for her.  Her appetite is good.     Left leg swollen:  The leg started swelling last week for no reason. There was no injury. The swelling has decreased.  Her daughter does not think she has any discomfort, but it is hard to say.  She has swelling up to her hip.  The swelling got a little better in her upper portion of her lower leg. She often tries to lower her foot off the wheelchair foot rest, but this is not new.    Medications and allergies reviewed with patient and updated if appropriate.  Patient Active Problem List   Diagnosis Date Noted  . Abnormal stool color 05/18/2016  . Left leg swelling 05/18/2016  . Heat rash 05/04/2016  . Palliative care encounter   . Encounter for hospice care discussion   . Pressure injury of skin 04/27/2016  . Thyroid mass   . Palliative care by specialist   . Aspiration pneumonia ([place]) 04/20/2016  . Hemidiaphragm paralysis   . Wheelchair bound 02/03/2016  . Conjunctival hemorrhage of right eye 01/28/2016  . Ecchymosis 01/28/2016  . Vaginal bleeding 01/28/2016  . Gross hematuria 01/28/2016  . Supratherapeutic INR 01/28/2016  . Excessive cerumen in ear canal, bilateral 12/31/2015  . Leg swelling 12/31/2015  . Discoloration of skin 09/26/2015  . Dermatitis 09/13/2015  . Essential thrombocytosis ([place]) 07/31/2015  . SVC (superior vena cava obstruction), chronic 07/20/2015  . Dysphagia 07/20/2015  . Elevated liver enzymes 07/20/2015  . Constipation 07/20/2015  . Thrombophlebitis of breast, right  06/18/2015  . Tracheal deviation 06/06/2015  . Sensorineural hearing loss, bilateral, moderate-moderately severe 04/30/2015  . Abdominal wall lump 03/09/2015  . Frequent UTI 02/06/2015  . Primary osteoarthritis involving multiple joints 07/26/2014  . Pernicious anemia 07/26/2014  . Hearing loss 07/26/2014  . Encounter for therapeutic drug monitoring 07/17/2014  . Spastic hemiparesis affecting dominant side ([place]), right 05/31/2014  . DVT of lower extremity, bilateral ([place]) 03/14/2014  . Global aphasia 03/14/2014  . 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 Aphasia S/P CVA 03/13/2014  . Cerebral infarction due to embolism of left middle cerebral artery ([place])   . Stroke, embolic ([place]) 53/97/6734  . History of pulmonary embolism 03/08/2014  . Back pain 08/29/2013  . Primary localized osteoarthrosis, lower leg 03/06/2013  . IBS (irritable bowel syndrome)   . Multinodular goiter 01/13/2011  . Hyperthyroidism 11/11/2009  . Insomnia 07/03/2009  . POSTTRAUMATIC STRESS DISORDER 08/22/2008  . OBESITY 04/21/2008  . Osteoarthritis 04/21/2008  . MIGRAINE HEADACHE 04/20/2008  . Essential hypertension 04/20/2008    Current Outpatient Prescriptions on File Prior to Visit  Medication Sig Dispense Refill  . acetaminophen (TYLENOL) 160 MG/5ML liquid Take 480 mg by mouth every 4 (four) hours as needed for fever or pain.    [name] albuterol (PROVENTIL HFA) 108 (90 Base) MCG/ACT inhaler Inhale 2 puffs into the lungs every 6 (six) hours as needed for wheezing or shortness of breath.  1 Inhaler 1  . diclofenac sodium (VOLTAREN) 1 % GEL APPLY (2GMS) TOPICALLY THREE TIMES DAILY. 300 g 5  . ELIQUIS 2.5 MG TABS tablet TAKE 1 TABLET TWICE A DAY 60 tablet 5  . gabapentin (NEURONTIN) 100 MG capsule Take 1 capsule (100 mg total) by mouth at bedtime.    [name] guaiFENesin (MUCINEX) 600 MG 12 hr tablet Take 1 tablet (600 mg total) by mouth 2 (two) times daily. 10 tablet 0  . hydroxyurea (HYDREA) 500 MG capsule Take 2 capsules  (1,000 mg total) by mouth daily. May take with food to minimize GI side effects. (Patient taking differently: Take 500 mg by mouth daily. ) 60 capsule 9  . ipratropium-albuterol (DUONEB) 0.5-2.5 (3) MG/3ML SOLN Take 3 mLs by nebulization every 6 (six) hours as needed. 360 mL 0  . methimazole (TAPAZOLE) 5 MG tablet TAKE 1 TABLET BY MOUTH TWICE WEEKLY (Patient taking differently: TAKE 1 TABLET BY MOUTH TWICE WEEKLY (Sun and Thurs)) 12 tablet 2  . polyethylene glycol (MIRALAX / GLYCOLAX) packet TAKE 17G BY MOUTH TWICE A DAY (Patient taking differently: TAKE 17G BY MOUTH TWICE A DAY as needed) 14 packet 1  . Probiotic Product (ALIGN) 4 MG CAPS Take 1 capsule by mouth daily.     . propranolol (INDERAL) 10 MG tablet TAKE 1 TABLET TWICE A DAY (Patient taking differently: TAKE 1 TABLET once A DAY) 60 tablet 5  . PROTONIX 40 MG PACK TAKE 20 MLS (40 MG TOTAL) BY MOUTH DAILY. (Patient taking differently: Take 40 mg by mouth daily. Mix with applesauce) 30 each 5  . terconazole (TERAZOL 7) 0.4 % vaginal cream Place 1 applicator vaginally at bedtime. (Patient taking differently: Place 1 applicator vaginally at bedtime as needed (irritation). ) 90 g 3  . traMADol (ULTRAM) 50 MG tablet TAKE 2 TABLETS BY MOUTH 3 TIMES A DAY AS NEEDED 180 tablet 1  . traZODone (DESYREL) 50 MG tablet TAKE 1 TABLET (50 MG TOTAL) BY MOUTH AT BEDTIME AS NEEDED. FOR SLEEP 30 tablet 5   No current facility-administered medications on file prior to visit.     Past Medical History:  Diagnosis Date  . ARTHRITIS   . Arthritis   . Diverticulosis   . HYPERTENSION   . HYPERTHYROIDISM   . Hyperthyroidism    s/p I-131 ablation 03/2011 of multinod goiter  . INSOMNIA   . MIGRAINE HEADACHE   . OBESITY   . Posttraumatic stress disorder   . Pulmonary embolism ([place]) 03/2014   with DVT  . Stroke [facility]) 03/2014   dysarthria    Past Surgical History:  Procedure Laterality Date  . ABDOMINAL HYSTERECTOMY  1976  . BREAST SURGERY     biopsy  .  RADIOLOGY WITH ANESTHESIA Left 03/08/2014   Procedure: RADIOLOGY WITH ANESTHESIA;  Surgeon: Rob Hickman, MD;  Location: [facility];  Service: Radiology;  Laterality: Left;  . TONSILLECTOMY    . TOTAL HIP ARTHROPLASTY  1998    right    Social History   Social History  . Marital status: Widowed    Spouse name: N/A  . Number of children: 2  . Years of education: 14   Occupational History  . retired     Restaurant manager, fast food   Social History Main Topics  . Smoking status: Never Smoker  . Smokeless tobacco: Never Used  . Alcohol use No  . Drug use: No  . Sexual activity: No   Other Topics Concern  . None   Social History  Narrative   Widowed, lived alone prior to CVA 03/2014   SNF at [facility], then home 07/2014 with dtr   Right handed   Caffeine use- occasionally drinks tea    Family History  Problem Relation Age of Onset  . Asthma Mother   . Asthma Father   . Prostate cancer Father   . Stroke Brother   . Breast cancer Sister   . Stomach cancer Sister   . Thyroid disease Neg Hx    Provided by daughter:  Review of Systems  Constitutional: Negative for appetite change and fever.  Respiratory: Negative for cough, shortness of breath and wheezing.   Cardiovascular: Positive for leg swelling. Negative for chest pain.       Objective:   Vitals:   05/18/16 1457  BP: (!) 92/58  Pulse: 77  Resp: 16  Temp: 97.9 F (36.6 C)   There were no vitals filed for this visit. There is no height or weight on file to calculate BMI.  Wt Readings from Last 3 Encounters:  04/29/16 164 lb 12.8 oz (74.8 kg)  04/13/16 176 lb (79.8 kg)  12/31/15 186 lb (84.4 kg)     Physical Exam Constitutional: Appears well-developed and well-nourished. No distress.  HENT:  Head: Normocephalic and atraumatic.  Neck: Neck supple. No tracheal deviation present. thyromegaly present.  No cervical lymphadenopathy Cardiovascular: Normal rate, regular rhythm and normal heart sounds.   2/6  systolic murmur heard. No carotid bruit .  + pitting edema LLE extending to and including lower abdomen, no edema RLE Pulmonary/Chest: Effort normal and breath sounds normal. No respiratory distress. No has no wheezes. No rales.  Skin: Skin is warm and dry. Not diaphoretic.  Psychiatric: Normal mood and affect. Behavior is normal.         Assessment & Plan:   See Problem List for Assessment and Plan of chronic medical problems.

## 2016-05-17 ENCOUNTER — Other Ambulatory Visit: Payer: Self-pay | Admitting: Internal Medicine

## 2016-05-18 ENCOUNTER — Encounter: Payer: Self-pay | Admitting: Internal Medicine

## 2016-05-18 ENCOUNTER — Ambulatory Visit (INDEPENDENT_AMBULATORY_CARE_PROVIDER_SITE_OTHER): Payer: Medicare Other | Admitting: Internal Medicine

## 2016-05-18 ENCOUNTER — Emergency Department (HOSPITAL_COMMUNITY): Payer: Medicare Other

## 2016-05-18 ENCOUNTER — Inpatient Hospital Stay (HOSPITAL_COMMUNITY)
Admission: EM | Admit: 2016-05-18 | Discharge: 2016-05-26 | DRG: 299 | Disposition: A | Discharge status: Home Health | Payer: Medicare Other | Attending: Internal Medicine | Admitting: Internal Medicine

## 2016-05-18 ENCOUNTER — Encounter (HOSPITAL_COMMUNITY): Payer: Self-pay | Admitting: *Deleted

## 2016-05-18 VITALS — BP 92/58 | HR 77 | Temp 97.9°F | Resp 16

## 2016-05-18 DIAGNOSIS — K5790 Diverticulosis of intestine, part unspecified, without perforation or abscess without bleeding: Secondary | ICD-10-CM | POA: Diagnosis present

## 2016-05-18 DIAGNOSIS — Z803 Family history of malignant neoplasm of breast: Secondary | ICD-10-CM

## 2016-05-18 DIAGNOSIS — I82432 Acute embolism and thrombosis of left popliteal vein: Secondary | ICD-10-CM | POA: Diagnosis present

## 2016-05-18 DIAGNOSIS — R2242 Localized swelling, mass and lump, left lower limb: Secondary | ICD-10-CM | POA: Diagnosis not present

## 2016-05-18 DIAGNOSIS — K661 Hemoperitoneum: Secondary | ICD-10-CM | POA: Diagnosis not present

## 2016-05-18 DIAGNOSIS — I69351 Hemiplegia and hemiparesis following cerebral infarction affecting right dominant side: Secondary | ICD-10-CM

## 2016-05-18 DIAGNOSIS — Z6830 Body mass index (BMI) 30.0-30.9, adult: Secondary | ICD-10-CM

## 2016-05-18 DIAGNOSIS — Z7901 Long term (current) use of anticoagulants: Secondary | ICD-10-CM

## 2016-05-18 DIAGNOSIS — Z86711 Personal history of pulmonary embolism: Secondary | ICD-10-CM

## 2016-05-18 DIAGNOSIS — L89151 Pressure ulcer of sacral region, stage 1: Secondary | ICD-10-CM | POA: Diagnosis present

## 2016-05-18 DIAGNOSIS — R195 Other fecal abnormalities: Secondary | ICD-10-CM | POA: Insufficient documentation

## 2016-05-18 DIAGNOSIS — S32029A Unspecified fracture of second lumbar vertebra, initial encounter for closed fracture: Secondary | ICD-10-CM | POA: Diagnosis present

## 2016-05-18 DIAGNOSIS — I82409 Acute embolism and thrombosis of unspecified deep veins of unspecified lower extremity: Secondary | ICD-10-CM | POA: Diagnosis present

## 2016-05-18 DIAGNOSIS — M1990 Unspecified osteoarthritis, unspecified site: Secondary | ICD-10-CM | POA: Diagnosis present

## 2016-05-18 DIAGNOSIS — H9190 Unspecified hearing loss, unspecified ear: Secondary | ICD-10-CM | POA: Diagnosis present

## 2016-05-18 DIAGNOSIS — N390 Urinary tract infection, site not specified: Secondary | ICD-10-CM | POA: Diagnosis not present

## 2016-05-18 DIAGNOSIS — M7989 Other specified soft tissue disorders: Secondary | ICD-10-CM

## 2016-05-18 DIAGNOSIS — E0520 Thyrotoxicosis with toxic multinodular goiter without thyrotoxic crisis or storm: Secondary | ICD-10-CM | POA: Diagnosis present

## 2016-05-18 DIAGNOSIS — N281 Cyst of kidney, acquired: Secondary | ICD-10-CM | POA: Diagnosis not present

## 2016-05-18 DIAGNOSIS — Z96641 Presence of right artificial hip joint: Secondary | ICD-10-CM | POA: Diagnosis present

## 2016-05-18 DIAGNOSIS — I82422 Acute embolism and thrombosis of left iliac vein: Secondary | ICD-10-CM | POA: Diagnosis present

## 2016-05-18 DIAGNOSIS — R7989 Other specified abnormal findings of blood chemistry: Secondary | ICD-10-CM | POA: Diagnosis present

## 2016-05-18 DIAGNOSIS — M79605 Pain in left leg: Secondary | ICD-10-CM

## 2016-05-18 DIAGNOSIS — D539 Nutritional anemia, unspecified: Secondary | ICD-10-CM | POA: Diagnosis present

## 2016-05-18 DIAGNOSIS — Z90710 Acquired absence of both cervix and uterus: Secondary | ICD-10-CM

## 2016-05-18 DIAGNOSIS — I639 Cerebral infarction, unspecified: Secondary | ICD-10-CM | POA: Diagnosis present

## 2016-05-18 DIAGNOSIS — E042 Nontoxic multinodular goiter: Secondary | ICD-10-CM | POA: Diagnosis present

## 2016-05-18 DIAGNOSIS — E669 Obesity, unspecified: Secondary | ICD-10-CM | POA: Diagnosis present

## 2016-05-18 DIAGNOSIS — F4310 Post-traumatic stress disorder, unspecified: Secondary | ICD-10-CM | POA: Diagnosis present

## 2016-05-18 DIAGNOSIS — I82412 Acute embolism and thrombosis of left femoral vein: Principal | ICD-10-CM | POA: Diagnosis present

## 2016-05-18 DIAGNOSIS — I69320 Aphasia following cerebral infarction: Secondary | ICD-10-CM

## 2016-05-18 DIAGNOSIS — Z823 Family history of stroke: Secondary | ICD-10-CM

## 2016-05-18 DIAGNOSIS — Z825 Family history of asthma and other chronic lower respiratory diseases: Secondary | ICD-10-CM

## 2016-05-18 DIAGNOSIS — I69391 Dysphagia following cerebral infarction: Secondary | ICD-10-CM

## 2016-05-18 DIAGNOSIS — E0590 Thyrotoxicosis, unspecified without thyrotoxic crisis or storm: Secondary | ICD-10-CM | POA: Diagnosis present

## 2016-05-18 DIAGNOSIS — Z8042 Family history of malignant neoplasm of prostate: Secondary | ICD-10-CM

## 2016-05-18 DIAGNOSIS — Z800 Family history of malignant neoplasm of digestive organs: Secondary | ICD-10-CM

## 2016-05-18 DIAGNOSIS — Z993 Dependence on wheelchair: Secondary | ICD-10-CM

## 2016-05-18 DIAGNOSIS — R1310 Dysphagia, unspecified: Secondary | ICD-10-CM | POA: Diagnosis present

## 2016-05-18 DIAGNOSIS — Z86718 Personal history of other venous thrombosis and embolism: Secondary | ICD-10-CM

## 2016-05-18 DIAGNOSIS — I10 Essential (primary) hypertension: Secondary | ICD-10-CM | POA: Diagnosis present

## 2016-05-18 DIAGNOSIS — K5641 Fecal impaction: Secondary | ICD-10-CM | POA: Diagnosis present

## 2016-05-18 LAB — BASIC METABOLIC PANEL
Anion gap: 8 (ref 5–15)
BUN: 14 mg/dL (ref 6–20)
CO2: 22 mmol/L (ref 22–32)
Calcium: 9 mg/dL (ref 8.9–10.3)
Chloride: 110 mmol/L (ref 101–111)
Creatinine, Ser: 0.43 mg/dL — ABNORMAL LOW (ref 0.44–1.00)
GFR calc Af Amer: >60 mL/min (ref >60)
GFR calc non Af Amer: >60 mL/min (ref >60)
Glucose, Bld: 95 mg/dL (ref 65–99)
Potassium: 4.3 mmol/L (ref 3.5–5.1)
Sodium: 140 mmol/L (ref 135–145)

## 2016-05-18 LAB — CBC WITH DIFFERENTIAL/PLATELET
Basophils Absolute: 0.1 10*3/uL (ref 0.0–0.1)
Basophils Relative: 1 %
Eosinophils Absolute: 0.2 10*3/uL (ref 0.0–0.7)
Eosinophils Relative: 3 %
HCT: 38.7 % (ref 36.0–46.0)
Hemoglobin: 12.3 g/dL (ref 12.0–15.0)
Lymphocytes Relative: 19 %
Lymphs Abs: 1.2 10*3/uL (ref 0.7–4.0)
MCH: 35.1 pg — ABNORMAL HIGH (ref 26.0–34.0)
MCHC: 31.8 g/dL (ref 30.0–36.0)
MCV: 110.6 fL — ABNORMAL HIGH (ref 78.0–100.0)
Monocytes Absolute: 0.6 10*3/uL (ref 0.1–1.0)
Monocytes Relative: 10 %
Neutro Abs: 4.2 10*3/uL (ref 1.7–7.7)
Neutrophils Relative %: 67 %
Platelets: 510 10*3/uL — ABNORMAL HIGH (ref 150–400)
RBC: 3.5 MIL/uL — ABNORMAL LOW (ref 3.87–5.11)
RDW: 14.7 % (ref 11.5–15.5)
WBC: 6.3 10*3/uL (ref 4.0–10.5)

## 2016-05-18 LAB — PROTIME-INR
INR: 1.28
Prothrombin Time: 16.1 seconds — ABNORMAL HIGH (ref 11.4–15.2)

## 2016-05-18 MED ORDER — IOPAMIDOL (ISOVUE-300) INJECTION 61%
100.0000 mL | Freq: Once | INTRAVENOUS | Status: AC | PRN
  Administered 2016-05-18: 100 mL via INTRAVENOUS

## 2016-05-18 NOTE — Assessment & Plan Note (Addendum)
Extensive swelling in left leg from foot to lower abdomen - pitting in nature.  No swelling in RLE Given history of clots and thrombocytosis - need to rule out dvt even though she is on eliquis Transportation is an issue and we can either wait until Wednesday to have an outpatient [place] of her LLE with [facility] transportation or she goes home with [facility] today and calls 911 to bring her to the ED today - we have no other choices -- given the swelling and possibility of a DVT will be seen in ED tonight

## 2016-05-18 NOTE — Telephone Encounter (Signed)
Due to Pt not having Medicaid. Insurance will not cover Aid.   LVM with Sandy Barrett to get more info on Aid to assist pt and family.

## 2016-05-18 NOTE — ED Triage Notes (Signed)
Pt to ED from home by [facility] c/o L leg swelling x 1 week. Pt at baseline per EMS has consistent pain to L leg, reports edema is new.

## 2016-05-18 NOTE — ED Notes (Signed)
Pt taken to CT.

## 2016-05-18 NOTE — Assessment & Plan Note (Signed)
Need fecal occult - since going to ED will have it checked in hospital Can do home FIT cards if needed

## 2016-05-18 NOTE — ED Provider Notes (Signed)
[facility] DEPT Provider Note   CSN: [id number] Arrival date & time: 05/18/16  1920     History   Chief Complaint Chief Complaint  Patient presents with  . Leg Swelling    HPI Sandy Barrett is a 83 y.o. female.  LEVEL 5 CAVEAT for patient's baseline mental status. Patient was recently admitted to the hospital for an aspiration pneumonia, discharged 3 week ago. Received Geiger lovenox injections while inpatient. Developed nodules over abdomen since then. Now with LLE swelling over the last 1 week. No chest pain or shortness of breath. No f/c. No infectious sx. Denies pain in leg or abdomen.    The history is provided by a relative.  Illness  This is a new problem. Episode onset: 1 wk ago. The problem occurs constantly. The problem has been gradually worsening. Pertinent negatives include no chest pain, no abdominal pain and no shortness of breath. She has tried nothing for the symptoms.    Past Medical History:  Diagnosis Date  . ARTHRITIS   . Arthritis   . Diverticulosis   . HYPERTENSION   . HYPERTHYROIDISM   . Hyperthyroidism    s/p I-131 ablation 03/2011 of multinod goiter  . INSOMNIA   . MIGRAINE HEADACHE   . OBESITY   . Posttraumatic stress disorder   . Pulmonary embolism ([place]) 03/2014   with DVT  . Stroke [facility]) 03/2014   dysarthria    Patient Active Problem List   Diagnosis Date Noted  . Abnormal stool color 05/18/2016  . Left leg swelling 05/18/2016  . Heat rash 05/04/2016  . Palliative care encounter   . Encounter for hospice care discussion   . Pressure injury of skin 04/27/2016  . Thyroid mass   . Palliative care by specialist   . Aspiration pneumonia ([place]) 04/20/2016  . Hemidiaphragm paralysis   . Wheelchair bound 02/03/2016  . Conjunctival hemorrhage of right eye 01/28/2016  . Ecchymosis 01/28/2016  . Vaginal bleeding 01/28/2016  . Gross hematuria 01/28/2016  . Supratherapeutic INR 01/28/2016  . Leg swelling 12/31/2015  . Discoloration of  skin 09/26/2015  . Dermatitis 09/13/2015  . Essential thrombocytosis ([place]) 07/31/2015  . SVC (superior vena cava obstruction), chronic 07/20/2015  . Dysphagia 07/20/2015  . Elevated liver enzymes 07/20/2015  . Constipation 07/20/2015  . Thrombophlebitis of breast, right 06/18/2015  . Tracheal deviation 06/06/2015  . Sensorineural hearing loss, bilateral, moderate-moderately severe 04/30/2015  . Abdominal wall lump 03/09/2015  . Frequent UTI 02/06/2015  . Primary osteoarthritis involving multiple joints 07/26/2014  . Pernicious anemia 07/26/2014  . Hearing loss 07/26/2014  . Encounter for therapeutic drug monitoring 07/17/2014  . Spastic hemiparesis affecting dominant side ([place]), right 05/31/2014  . DVT of lower extremity, bilateral ([place]) 03/14/2014  . Global aphasia 03/14/2014  . 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 Aphasia S/P CVA 03/13/2014  . Cerebral infarction due to embolism of left middle cerebral artery ([place])   . Stroke, embolic ([place]) 74/08/1446  . History of pulmonary embolism 03/08/2014  . Back pain 08/29/2013  . Primary localized osteoarthrosis, lower leg 03/06/2013  . IBS (irritable bowel syndrome)   . Multinodular goiter 01/13/2011  . Hyperthyroidism 11/11/2009  . Insomnia 07/03/2009  . POSTTRAUMATIC STRESS DISORDER 08/22/2008  . OBESITY 04/21/2008  . Osteoarthritis 04/21/2008  . MIGRAINE HEADACHE 04/20/2008  . Essential hypertension 04/20/2008    Past Surgical History:  Procedure Laterality Date  . ABDOMINAL HYSTERECTOMY  1976  . BREAST SURGERY     biopsy  . RADIOLOGY WITH ANESTHESIA  Left 03/08/2014   Procedure: RADIOLOGY WITH ANESTHESIA;  Surgeon: Rob Hickman, MD;  Location: [facility];  Service: Radiology;  Laterality: Left;  . TONSILLECTOMY    . TOTAL HIP ARTHROPLASTY  1998    right    OB History    No data available       Home Medications    Prior to Admission medications   Medication Sig Start Date End Date Taking? Authorizing Provider    acetaminophen (TYLENOL) 160 MG/5ML liquid Take 480 mg by mouth every 4 (four) hours as needed for fever or pain.    [provider]  albuterol (PROVENTIL HFA) 108 (90 Base) MCG/ACT inhaler Inhale 2 puffs into the lungs every 6 (six) hours as needed for wheezing or shortness of breath. 05/15/15   Burnard Hawthorne, FNP  diclofenac sodium (VOLTAREN) 1 % GEL APPLY (2GMS) TOPICALLY THREE TIMES DAILY. 01/30/16   [name], MD  ELIQUIS 2.5 MG TABS tablet TAKE 1 TABLET TWICE A DAY 05/18/16   [name], MD  gabapentin (NEURONTIN) 100 MG capsule Take 1 capsule (100 mg total) by mouth at bedtime. 04/28/16   Domenic Polite, MD  guaiFENesin (MUCINEX) 600 MG 12 hr tablet Take 1 tablet (600 mg total) by mouth 2 (two) times daily. 04/29/16   Hosie Poisson, MD  hydroxyurea (HYDREA) 500 MG capsule Take 2 capsules (1,000 mg total) by mouth daily. May take with food to minimize GI side effects. Patient taking differently: Take 500 mg by mouth daily.  08/23/15   Heath Lark, MD  ipratropium-albuterol (DUONEB) 0.5-2.5 (3) MG/3ML SOLN Take 3 mLs by nebulization every 6 (six) hours as needed. 04/29/16   Hosie Poisson, MD  methimazole (TAPAZOLE) 5 MG tablet TAKE 1 TABLET BY MOUTH TWICE WEEKLY Patient taking differently: TAKE 1 TABLET BY MOUTH TWICE WEEKLY (Sun and Thurs) 04/19/16   Renato Shin, MD  polyethylene glycol (MIRALAX / GLYCOLAX) packet TAKE 17G BY MOUTH TWICE A DAY Patient taking differently: TAKE 17G BY MOUTH TWICE A DAY as needed 03/12/16   [name], MD  Probiotic Product (ALIGN) 4 MG CAPS Take 1 capsule by mouth daily.     [provider]  propranolol (INDERAL) 10 MG tablet TAKE 1 TABLET TWICE A DAY Patient taking differently: TAKE 1 TABLET once A DAY 12/02/15   Burns, Claudina Lick, MD  PROTONIX 40 MG PACK TAKE 20 MLS (40 MG TOTAL) BY MOUTH DAILY. Patient taking differently: Take 40 mg by mouth daily. Mix with applesauce 02/13/16   Burns, Claudina Lick, MD  terconazole (TERAZOL 7) 0.4 %  vaginal cream Place 1 applicator vaginally at bedtime. Patient taking differently: Place 1 applicator vaginally at bedtime as needed (irritation).  04/18/15   Lavonia Drafts, MD  traMADol (ULTRAM) 50 MG tablet TAKE 2 TABLETS BY MOUTH 3 TIMES A DAY AS NEEDED 05/04/16   [name], MD  traZODone (DESYREL) 50 MG tablet TAKE 1 TABLET (50 MG TOTAL) BY MOUTH AT BEDTIME AS NEEDED. FOR SLEEP 02/13/16   [name], MD    Family History Family History  Problem Relation Age of Onset  . Asthma Mother   . Asthma Father   . Prostate cancer Father   . Stroke Brother   . Breast cancer Sister   . Stomach cancer Sister   . Thyroid disease Neg Hx     Social History Social History  Substance Use Topics  . Smoking status: Never Smoker  . Smokeless tobacco: Never Used  .  Alcohol use No     Allergies   Patient has no known allergies.   Review of Systems Review of Systems  Unable to perform ROS: Patient nonverbal  Constitutional: Negative for chills and fever.  Respiratory: Negative for shortness of breath.   Cardiovascular: Negative for chest pain.  Gastrointestinal: Negative for abdominal pain, diarrhea and vomiting.     Physical Exam Updated Vital Signs BP 107/86   Pulse 89   Temp 98.6 F (37 C)   Resp 19   SpO2 97%   Physical Exam  Constitutional: She appears well-developed and well-nourished. No distress.  HENT:  Head: Normocephalic and atraumatic.  Eyes: Conjunctivae are normal.  Neck: Neck supple.  Cardiovascular: Normal rate and regular rhythm.   No murmur heard. Pulmonary/Chest: Effort normal and breath sounds normal. No respiratory distress.  Abdominal: Soft. There is tenderness (over nodules on abdomen). There is no rebound and no guarding.  Multiple large areas of ecchymoses and nodules on lower abdomen.   Musculoskeletal: She exhibits no edema.  Significant swelling of LLE. No edema.   Neurological: She is alert.  Skin: Skin is warm and dry.    Psychiatric: She has a normal mood and affect.  Nursing note and vitals reviewed.    ED Treatments / Results  Labs (all labs ordered are listed, but only abnormal results are displayed) Labs Reviewed  BASIC METABOLIC PANEL - Abnormal; Notable for the following:       Result Value   Creatinine, Ser 0.43 (*)    All other components within normal limits  CBC WITH DIFFERENTIAL/PLATELET - Abnormal; Notable for the following:    RBC 3.50 (*)    MCV 110.6 (*)    MCH 35.1 (*)    Platelets 510 (*)    All other components within normal limits  PROTIME-INR - Abnormal; Notable for the following:    Prothrombin Time 16.1 (*)    All other components within normal limits    EKG  EKG Interpretation None       Radiology Ct Abdomen Pelvis W Contrast  Result Date: 05/18/2016 CLINICAL DATA:  Abdominal mass. Poor historian. Left lower leg swelling. EXAM: CT ABDOMEN AND PELVIS WITH CONTRAST TECHNIQUE: Multidetector CT imaging of the abdomen and pelvis was performed using the standard protocol following bolus administration of intravenous contrast. CONTRAST:  157mL ISOVUE-300 IOPAMIDOL (ISOVUE-300) INJECTION 61% COMPARISON:  CT of the abdomen and pelvis on 06/27/2015 in CT of the chest on 04/21/2016 FINDINGS: Lower chest: The heart is enlarged. Numerous left chest wall collaterals, consistent with superior vena cava compression from goiter. Pectus excavatum. Hepatobiliary: The liver is normal in appearance. A 1 cm gallstone is present. Pancreas: Unremarkable. No pancreatic ductal dilatation or surrounding inflammatory changes. Spleen: Normal in size without focal abnormality. Adrenals/Urinary Tract: Adrenal glands are unremarkable. Normal enhancement and excretion from both kidneys. There are bilateral parapelvic cysts. Stomach/Bowel: The stomach is normal in appearance. No small bowel dilatation or bowel wall thickening. Anterior abdominal wall hernia contains nondilated loops of small bowel. Large  bowel loops contain a large amount of stool. Impacted stool identified within the rectosigmoid colon. Vascular/Lymphatic: There is atherosclerotic calcification of the abdominal aorta. No retroperitoneal or mesenteric adenopathy. Reproductive: Hysterectomy.  No adnexal mass. Other: Lobulated anterior abdominal wall mass is identified in the subcutaneous tissues extending from the left iliac crest, crossing midline. Mass measures approximately 23 x 3.9 x 4.2 cm. The density favors hematoma. The skin of the right breast is markedly thickened, consistent with dependent  edema and/or inflammatory malignancy. Musculoskeletal: Biconcave fracture of L3. Superior endplate fracture of L2, L1. Right hip arthroplasty. IMPRESSION: 1. Anterior abdominal wall mass is new since the previous exam. Given its size and density, hematoma is favored. Correlation with physical exam, ecchymosis, and history is recommended. 2. Rectosigmoid impaction. 3. Cardiomegaly. 4. Multiple venous collaterals, consistent with relative superior vena cava obstruction. Patient is known to have a large goiter with displacement and effacement of superior vena cava. 5. Bilateral parapelvic renal cysts. 6. Anterior abdominal wall hernia containing nondilated loops of small bowel. 7. Prior hysterectomy. 8. Numerous vertebral fractures ; L2 fracture is new since June of 2017. 9. Abnormal right breast consistent with edema or malignancy. Diagnostic evaluation with mammogram and ultrasound recommended. Electronically Signed   By: Nolon Nations M.D.   On: 05/18/2016 22:34    Procedures Procedures (including critical care time)  Medications Ordered in ED Medications  iopamidol (ISOVUE-300) 61 % injection 100 mL (100 mLs Intravenous Contrast Given 05/18/16 2151)     Initial Impression / Assessment and Plan / ED Course  I have reviewed the triage vital signs and the nursing notes.  Pertinent labs & imaging results that were available during my care  of the patient were reviewed by me and considered in my medical decision making (see chart for details).     Patient does have a substantial amount of swelling in her left lower extremity. Her abdomen also reveals multiple subcutaneous nodules with overlying ecchymoses. Feels like a hematoma. Abdomen is benign. Got a CT of the patient's abdomen and pelvis to evaluate for possible local compression effect of the vasculature that could be causing the patient's like to be swelling. No evidence of acute intra-abdominal pathology that could be causing this; however it does show evidence of displacement and effacement of the SVC, known to be present on prior evaluations from a large goiter. Performed our own bedside ultrasound. Concern for common femoral vein thrombosis with minimal compressibility of the common femoral vein. Clinically this would fit with the degree of the patient's leg swelling. Otherwise labs within normal limits. No anemia. No leukocytosis. Electrolytes within normal limits.  Since the patient is on Eliquis and still has developed what appears to be a DVT, we'll start the patient on heparin and admit her to the hospital.    Final Clinical Impressions(s) / ED Diagnoses   Final diagnoses:  Left leg swelling  History of DVT of lower extremity    New Prescriptions New Prescriptions   No medications on file     Maryan Puls, MD 05/18/16 2340    Carmin Muskrat, MD 05/19/16 2035

## 2016-05-18 NOTE — Patient Instructions (Signed)
An [place] of your leg was ordered.  We will call you with the results.    Stool cards were given to rule out blood in your stool.

## 2016-05-19 ENCOUNTER — Observation Stay (HOSPITAL_BASED_OUTPATIENT_CLINIC_OR_DEPARTMENT_OTHER): Payer: Medicare Other

## 2016-05-19 ENCOUNTER — Encounter (HOSPITAL_COMMUNITY): Payer: Self-pay

## 2016-05-19 DIAGNOSIS — I69920 Aphasia following unspecified cerebrovascular disease: Secondary | ICD-10-CM | POA: Diagnosis not present

## 2016-05-19 DIAGNOSIS — D539 Nutritional anemia, unspecified: Secondary | ICD-10-CM | POA: Diagnosis not present

## 2016-05-19 DIAGNOSIS — Z6830 Body mass index (BMI) 30.0-30.9, adult: Secondary | ICD-10-CM | POA: Diagnosis not present

## 2016-05-19 DIAGNOSIS — N390 Urinary tract infection, site not specified: Secondary | ICD-10-CM | POA: Diagnosis present

## 2016-05-19 DIAGNOSIS — I82432 Acute embolism and thrombosis of left popliteal vein: Secondary | ICD-10-CM | POA: Diagnosis present

## 2016-05-19 DIAGNOSIS — Z993 Dependence on wheelchair: Secondary | ICD-10-CM | POA: Diagnosis not present

## 2016-05-19 DIAGNOSIS — Z800 Family history of malignant neoplasm of digestive organs: Secondary | ICD-10-CM | POA: Diagnosis not present

## 2016-05-19 DIAGNOSIS — I10 Essential (primary) hypertension: Secondary | ICD-10-CM | POA: Diagnosis not present

## 2016-05-19 DIAGNOSIS — Z803 Family history of malignant neoplasm of breast: Secondary | ICD-10-CM | POA: Diagnosis not present

## 2016-05-19 DIAGNOSIS — Z823 Family history of stroke: Secondary | ICD-10-CM | POA: Diagnosis not present

## 2016-05-19 DIAGNOSIS — S301XXA Contusion of abdominal wall, initial encounter: Secondary | ICD-10-CM | POA: Diagnosis not present

## 2016-05-19 DIAGNOSIS — I82409 Acute embolism and thrombosis of unspecified deep veins of unspecified lower extremity: Secondary | ICD-10-CM | POA: Diagnosis present

## 2016-05-19 DIAGNOSIS — I82412 Acute embolism and thrombosis of left femoral vein: Secondary | ICD-10-CM | POA: Diagnosis not present

## 2016-05-19 DIAGNOSIS — Z86718 Personal history of other venous thrombosis and embolism: Secondary | ICD-10-CM | POA: Diagnosis not present

## 2016-05-19 DIAGNOSIS — H9190 Unspecified hearing loss, unspecified ear: Secondary | ICD-10-CM | POA: Diagnosis present

## 2016-05-19 DIAGNOSIS — Z7901 Long term (current) use of anticoagulants: Secondary | ICD-10-CM | POA: Diagnosis not present

## 2016-05-19 DIAGNOSIS — I69351 Hemiplegia and hemiparesis following cerebral infarction affecting right dominant side: Secondary | ICD-10-CM | POA: Diagnosis not present

## 2016-05-19 DIAGNOSIS — Z96641 Presence of right artificial hip joint: Secondary | ICD-10-CM | POA: Diagnosis present

## 2016-05-19 DIAGNOSIS — M1990 Unspecified osteoarthritis, unspecified site: Secondary | ICD-10-CM | POA: Diagnosis present

## 2016-05-19 DIAGNOSIS — F4310 Post-traumatic stress disorder, unspecified: Secondary | ICD-10-CM | POA: Diagnosis present

## 2016-05-19 DIAGNOSIS — S32029A Unspecified fracture of second lumbar vertebra, initial encounter for closed fracture: Secondary | ICD-10-CM | POA: Diagnosis present

## 2016-05-19 DIAGNOSIS — M79605 Pain in left leg: Secondary | ICD-10-CM | POA: Diagnosis not present

## 2016-05-19 DIAGNOSIS — Z86711 Personal history of pulmonary embolism: Secondary | ICD-10-CM | POA: Diagnosis not present

## 2016-05-19 DIAGNOSIS — Z8042 Family history of malignant neoplasm of prostate: Secondary | ICD-10-CM | POA: Diagnosis not present

## 2016-05-19 DIAGNOSIS — I69320 Aphasia following cerebral infarction: Secondary | ICD-10-CM | POA: Diagnosis not present

## 2016-05-19 DIAGNOSIS — Z825 Family history of asthma and other chronic lower respiratory diseases: Secondary | ICD-10-CM | POA: Diagnosis not present

## 2016-05-19 DIAGNOSIS — I82422 Acute embolism and thrombosis of left iliac vein: Secondary | ICD-10-CM | POA: Diagnosis present

## 2016-05-19 DIAGNOSIS — G464 Cerebellar stroke syndrome: Secondary | ICD-10-CM | POA: Diagnosis not present

## 2016-05-19 DIAGNOSIS — E669 Obesity, unspecified: Secondary | ICD-10-CM | POA: Diagnosis present

## 2016-05-19 DIAGNOSIS — J80 Acute respiratory distress syndrome: Secondary | ICD-10-CM | POA: Diagnosis not present

## 2016-05-19 DIAGNOSIS — K661 Hemoperitoneum: Secondary | ICD-10-CM | POA: Diagnosis present

## 2016-05-19 DIAGNOSIS — E0590 Thyrotoxicosis, unspecified without thyrotoxic crisis or storm: Secondary | ICD-10-CM

## 2016-05-19 LAB — BASIC METABOLIC PANEL
Anion gap: 7 (ref 5–15)
BUN: 11 mg/dL (ref 6–20)
CO2: 26 mmol/L (ref 22–32)
Calcium: 9.1 mg/dL (ref 8.9–10.3)
Chloride: 110 mmol/L (ref 101–111)
Creatinine, Ser: 0.42 mg/dL — ABNORMAL LOW (ref 0.44–1.00)
GFR calc Af Amer: >60 mL/min (ref >60)
GFR calc non Af Amer: >60 mL/min (ref >60)
Glucose, Bld: 98 mg/dL (ref 65–99)
Potassium: 4 mmol/L (ref 3.5–5.1)
Sodium: 143 mmol/L (ref 135–145)

## 2016-05-19 LAB — CBC
HCT: 39.2 % (ref 36.0–46.0)
HCT: 36.8 % (ref 36.0–46.0)
Hemoglobin: 12.4 g/dL (ref 12.0–15.0)
Hemoglobin: 11.6 g/dL — ABNORMAL LOW (ref 12.0–15.0)
MCH: 34.9 pg — ABNORMAL HIGH (ref 26.0–34.0)
MCH: 34.6 pg — ABNORMAL HIGH (ref 26.0–34.0)
MCHC: 31.6 g/dL (ref 30.0–36.0)
MCHC: 31.5 g/dL (ref 30.0–36.0)
MCV: 110.4 fL — ABNORMAL HIGH (ref 78.0–100.0)
MCV: 109.9 fL — ABNORMAL HIGH (ref 78.0–100.0)
Platelets: 505 10*3/uL — ABNORMAL HIGH (ref 150–400)
Platelets: 459 10*3/uL — ABNORMAL HIGH (ref 150–400)
RBC: 3.55 MIL/uL — ABNORMAL LOW (ref 3.87–5.11)
RBC: 3.35 MIL/uL — ABNORMAL LOW (ref 3.87–5.11)
RDW: 14.7 % (ref 11.5–15.5)
RDW: 14.7 % (ref 11.5–15.5)
WBC: 7.5 10*3/uL (ref 4.0–10.5)
WBC: 7 10*3/uL (ref 4.0–10.5)

## 2016-05-19 LAB — HEPARIN LEVEL (UNFRACTIONATED): Heparin Unfractionated: 0.57 IU/mL (ref 0.30–0.70)

## 2016-05-19 LAB — APTT: aPTT: 78 seconds — ABNORMAL HIGH (ref 24–36)

## 2016-05-19 LAB — OCCULT BLOOD X 1 CARD TO LAB, STOOL: Fecal Occult Bld: POSITIVE — AB

## 2016-05-19 MED ORDER — TRAMADOL HCL 50 MG PO TABS
100.0000 mg | ORAL_TABLET | Freq: Three times a day (TID) | ORAL | Status: DC | PRN
  Administered 2016-05-19 – 2016-05-26 (×14): 100 mg via ORAL
  Filled 2016-05-19 (×14): qty 2

## 2016-05-19 MED ORDER — PANTOPRAZOLE SODIUM 40 MG PO PACK
40.0000 mg | PACK | Freq: Every day | ORAL | Status: DC
  Administered 2016-05-19 – 2016-05-26 (×8): 40 mg via ORAL
  Filled 2016-05-19 (×4): qty 20

## 2016-05-19 MED ORDER — IPRATROPIUM-ALBUTEROL 0.5-2.5 (3) MG/3ML IN SOLN: 3.0000 mL | Freq: Four times a day (QID) | RESPIRATORY_TRACT | Status: DC | PRN

## 2016-05-19 MED ORDER — ACETAMINOPHEN 325 MG PO TABS: 650.0000 mg | ORAL_TABLET | Freq: Four times a day (QID) | ORAL | Status: DC | PRN

## 2016-05-19 MED ORDER — SODIUM CHLORIDE 0.9% FLUSH
3.0000 mL | Freq: Two times a day (BID) | INTRAVENOUS | Status: DC
  Administered 2016-05-19 – 2016-05-25 (×5): 3 mL via INTRAVENOUS

## 2016-05-19 MED ORDER — HYDROXYUREA 500 MG PO CAPS
500.0000 mg | ORAL_CAPSULE | Freq: Every day | ORAL | Status: DC
  Administered 2016-05-21 – 2016-05-26 (×6): 500 mg via ORAL
  Filled 2016-05-19 (×8): qty 1

## 2016-05-19 MED ORDER — ACETAMINOPHEN 650 MG RE SUPP: 650.0000 mg | Freq: Four times a day (QID) | RECTAL | Status: DC | PRN

## 2016-05-19 MED ORDER — PROPRANOLOL HCL 10 MG PO TABS
10.0000 mg | ORAL_TABLET | Freq: Two times a day (BID) | ORAL | Status: DC
  Administered 2016-05-19 – 2016-05-24 (×8): 10 mg via ORAL
  Filled 2016-05-19 (×12): qty 1

## 2016-05-19 MED ORDER — RISAQUAD PO CAPS
1.0000 | ORAL_CAPSULE | Freq: Every day | ORAL | Status: DC
  Administered 2016-05-19 – 2016-05-26 (×8): 1 via ORAL
  Filled 2016-05-19 (×8): qty 1

## 2016-05-19 MED ORDER — GABAPENTIN 100 MG PO CAPS
100.0000 mg | ORAL_CAPSULE | Freq: Every day | ORAL | Status: DC
  Administered 2016-05-19 – 2016-05-25 (×7): 100 mg via ORAL
  Filled 2016-05-19 (×7): qty 1

## 2016-05-19 MED ORDER — TRAZODONE HCL 50 MG PO TABS
50.0000 mg | ORAL_TABLET | Freq: Every day | ORAL | Status: DC
  Administered 2016-05-19 – 2016-05-25 (×7): 50 mg via ORAL
  Filled 2016-05-19 (×7): qty 1

## 2016-05-19 MED ORDER — HYDROCODONE-ACETAMINOPHEN 5-325 MG PO TABS: 1.0000 | ORAL_TABLET | ORAL | Status: DC | PRN

## 2016-05-19 MED ORDER — HEPARIN (PORCINE) IN NACL 100-0.45 UNIT/ML-% IJ SOLN
1000.0000 [IU]/h | INTRAMUSCULAR | Status: DC
  Administered 2016-05-19 – 2016-05-25 (×7): 1000 [IU]/h via INTRAVENOUS
  Filled 2016-05-19 (×7): qty 250

## 2016-05-19 MED ORDER — POLYETHYLENE GLYCOL 3350 17 G PO PACK
17.0000 g | PACK | Freq: Every day | ORAL | Status: DC
  Administered 2016-05-19 – 2016-05-25 (×5): 17 g via ORAL
  Filled 2016-05-19 (×5): qty 1

## 2016-05-19 MED ORDER — METHIMAZOLE 5 MG PO TABS
5.0000 mg | ORAL_TABLET | ORAL | Status: DC
  Administered 2016-05-21 – 2016-05-24 (×2): 5 mg via ORAL
  Filled 2016-05-19 (×2): qty 1

## 2016-05-19 NOTE — Consult Note (Signed)
[facility] Nurse wound consult note Reason for Consult: Consult requested for bilat buttocks/sacrum Wound type: Patchy areas of partial thickness skin loss across bilat buttocks and sacrum, affected area approx 3X3cm, pink moist and macerated Pressure Injury POA: Yes, this was present on admission Dressing procedure/placement/frequency: Foam dressing to buttocks/sacrum; change Q 3 days or PRN soiling.  If pt is frequently incontinent, then leave foam dressing off and apply barrier cream PRN when turning or cleaning. Discussed plan of care with family member at the bedside who verbalized understanding. Please re-consult if further assistance is needed.  Thank-you,  Julien Girt MSN, [place], [place], [place], [place]

## 2016-05-19 NOTE — Progress Notes (Signed)
Patient received from ED with daughter at bedside. Skin assessment and tele monitoring completed. Call light within reach, bed alarm on

## 2016-05-19 NOTE — Progress Notes (Signed)
[facility] PROGRESS NOTE  Jamya Starry ZTI:[id number] DOB: [birth year] DOA: 05/18/2016  PCP: [name], MD  Brief History/Interval Summary: 83 year old African-American female with a past medical history of a stroke in March 2016, resulting in right-sided hemiparesis and expressive aphasia, history of prior DVT and PE for which we. She was anticoagulated with an request, history of goiter, not a surgical candidate, history of hypertension and recent hospitalization for aspiration pneumonia. Patient lives with her daughter. She is nonambulatory. She sought attention due to worsening swelling in her left leg. Family also noted swelling in her abdomen.  Reason for Visit: Acute left leg DVT. Abdominal wall hematoma.  Consultants: Phone conversation with general surgery and vascular surgery  Procedures:   Lower extremity venous Doppler Occlusive DVT noted in the Left common femoral, femoral, and profunda femoral veins. Partial DVT noted in the Left popliteal vein. Difficult to visualize calf veins due to patient position, but no obvious DVT. No DVT RLE. IVC/left iliac duplex: Based on Color and pulsed wave Doppler, appears to be occlusive DVT in the left iliac vein. IVC appears patent.   Antibiotics: None  Subjective/Interval History: Difficult to communicate with patient due to her aphasia. No mention of chest pain, nausea or vomiting. No abdominal pain.  ROS: Difficult to do due to her aphasia  Objective:  Vital Signs  Vitals:   05/19/16 0100 05/19/16 0131 05/19/16 0450 05/19/16 0700  BP: 108/66 138/80 (!) 127/21 (!) 113/59  Pulse: 89  86   Resp: (!) 24 (!) 21 19   Temp:  98.4 F (36.9 C) 98.2 F (36.8 C)   TempSrc:  Oral Oral   SpO2: 95% 97% 92%   Weight:  70 kg (154 lb 4.8 oz)    Height:  5' (1.524 m)      Intake/Output Summary (Last 24 hours) at 05/19/16 1122 Last data filed at 05/19/16 0800  Gross per 24 hour  Intake              240 ml  Output                 0 ml  Net              240 ml   Filed Weights   05/19/16 0000 05/19/16 0131  Weight: 74.8 kg (164 lb 14.5 oz) 70 kg (154 lb 4.8 oz)    General appearance: alert, cooperative, appears stated age and no distress Resp: clear to auscultation bilaterally Cardio: regular rate and rhythm, S1, S2 normal, no murmur, click, rub or gallop GI: Abdomen is soft. Firm mass noted in the lower abdomen and appears to be in the abdominal wall. Quite extensive and goes across the midline from right to left. Nontender. Extremities: Left leg is noted to be swollen compared to the right Neurologic: Right hemiparesis. Aphasic.  Lab Results:  Data Reviewed: I have personally reviewed following labs and imaging studies  CBC:  Recent Labs Lab 05/18/16 2115 05/19/16 0750  WBC 6.3 7.5  NEUTROABS 4.2  --   HGB 12.3 12.4  HCT 38.7 39.2  MCV 110.6* 110.4*  PLT 510* 505*    Basic Metabolic Panel:  Recent Labs Lab 05/18/16 2115 05/19/16 0234  NA 140 143  K 4.3 4.0  CL 110 110  CO2 22 26  GLUCOSE 95 98  BUN 14 11  CREATININE 0.43* 0.42*  CALCIUM 9.0 9.1    GFR: Estimated Creatinine Clearance: 48.1 mL/min (A) (by C-G formula based on SCr  of 0.42 mg/dL (L)).  Coagulation Profile:  Recent Labs Lab 05/18/16 2115  INR 1.28     Radiology Studies: Ct Abdomen Pelvis W Contrast  Result Date: 05/18/2016 CLINICAL DATA:  Abdominal mass. Poor historian. Left lower leg swelling. EXAM: CT ABDOMEN AND PELVIS WITH CONTRAST TECHNIQUE: Multidetector CT imaging of the abdomen and pelvis was performed using the standard protocol following bolus administration of intravenous contrast. CONTRAST:  129mL ISOVUE-300 IOPAMIDOL (ISOVUE-300) INJECTION 61% COMPARISON:  CT of the abdomen and pelvis on 06/27/2015 in CT of the chest on 04/21/2016 FINDINGS: Lower chest: The heart is enlarged. Numerous left chest wall collaterals, consistent with superior vena cava compression from goiter. Pectus excavatum.  Hepatobiliary: The liver is normal in appearance. A 1 cm gallstone is present. Pancreas: Unremarkable. No pancreatic ductal dilatation or surrounding inflammatory changes. Spleen: Normal in size without focal abnormality. Adrenals/Urinary Tract: Adrenal glands are unremarkable. Normal enhancement and excretion from both kidneys. There are bilateral parapelvic cysts. Stomach/Bowel: The stomach is normal in appearance. No small bowel dilatation or bowel wall thickening. Anterior abdominal wall hernia contains nondilated loops of small bowel. Large bowel loops contain a large amount of stool. Impacted stool identified within the rectosigmoid colon. Vascular/Lymphatic: There is atherosclerotic calcification of the abdominal aorta. No retroperitoneal or mesenteric adenopathy. Reproductive: Hysterectomy.  No adnexal mass. Other: Lobulated anterior abdominal wall mass is identified in the subcutaneous tissues extending from the left iliac crest, crossing midline. Mass measures approximately 23 x 3.9 x 4.2 cm. The density favors hematoma. The skin of the right breast is markedly thickened, consistent with dependent edema and/or inflammatory malignancy. Musculoskeletal: Biconcave fracture of L3. Superior endplate fracture of L2, L1. Right hip arthroplasty. IMPRESSION: 1. Anterior abdominal wall mass is new since the previous exam. Given its size and density, hematoma is favored. Correlation with physical exam, ecchymosis, and history is recommended. 2. Rectosigmoid impaction. 3. Cardiomegaly. 4. Multiple venous collaterals, consistent with relative superior vena cava obstruction. Patient is known to have a large goiter with displacement and effacement of superior vena cava. 5. Bilateral parapelvic renal cysts. 6. Anterior abdominal wall hernia containing nondilated loops of small bowel. 7. Prior hysterectomy. 8. Numerous vertebral fractures ; L2 fracture is new since June of 2017. 9. Abnormal right breast consistent with  edema or malignancy. Diagnostic evaluation with mammogram and ultrasound recommended. Electronically Signed   By: Nolon Nations M.D.   On: 05/18/2016 22:34     Medications:  Scheduled: . acidophilus  1 capsule Oral Daily  . gabapentin  100 mg Oral QHS  . hydroxyurea  500 mg Oral Q breakfast  . [START ON 05/21/2016] methimazole  5 mg Oral Once per day on Sun Thu  . pantoprazole sodium  40 mg Oral Daily  . polyethylene glycol  17 g Oral Daily  . propranolol  10 mg Oral BID  . sodium chloride flush  3 mL Intravenous Q12H  . traZODone  50 mg Oral QHS   Continuous:  QMG:[id number] **OR** acetaminophen, HYDROcodone-acetaminophen, ipratropium-albuterol, traMADol  Assessment/Plan:  Principal Problem:   DVT (deep venous thrombosis) (HCC) Active Problems:   Hyperthyroidism   Essential hypertension   Multinodular goiter   Stroke, embolic (HCC)   Aphasia S/P CVA   Left leg swelling     Acute DVT involving left lower extremity This seemed to have occurred while the patient was on Eliquis at home. Patient's daughter mentions that patient has been compliant. Patient is nonambulatory due to history of stroke. Management of DVT discussed briefly with  Dr. Bridgett Larsson with vascular surgery. No role for thrombolytics. Abdominal wall hematoma discussed with Dr. Ninfa Linden with general surgery. No role for any surgical intervention. Okay to initiate anticoagulation. We will start with IV heparin for now. Patient was on warfarin up until January of this year when it was changed over to Eliquis due to vaginal spotting. Patient never saw GYN. At this point in time warfarin will be the mainstay of treatment. If she develops vaginal bleeding and spotting she will need to see a GYN.  Abdominal wall hematoma Detected on CT scan. Quite extensive hematoma. Occurred during previous hospitalization. She was getting Lovenox injections during that stay. Discussed with general surgery (Dr. Ninfa Linden). No role for  acute intervention. May need to be seen by general surgery in a month in their office for this to be drained. Hemoglobin stable.  History of hyperthyroidism with goiter Continue medications. Stable. Not a surgical candidate.  History of stroke with right-sided hemiparesis and aphasia. Stable. Continue home medications.  History of thrombocytosis. Continue hydroxyurea. Followed by Dr. Alvy Bimler.  Fecal impaction. Porter enema was given. Laxatives daily.  L2 vertebral fracture. Stable. Appears to be asymptomatic.  Abnormal appearance of the skin over right breast. Incidentally detected on CT scan. Previous mammogram report reviewed. This was done in April 2017. Progressive skin thickening was noted at that time as well. Biopsy was recommended. Patient did undergo Punch biopsy in May 2017, which showed inflammatory cells. No malignant cells reported.  Chronic dysphagia. Aspiration precautions. Dysphagia diet.  DVT Prophylaxis: IV heparin.    Code Status: Partial code  Family Communication: Discussed with patient's daughter  Disposition Plan: Management as outlined above.    LOS: 0 days   [facility] Pager [phone] 05/19/2016, 11:22 AM  If 7PM-7AM, please contact night-coverage at [email or web address], password [facility]

## 2016-05-19 NOTE — Progress Notes (Signed)
ANTICOAGULATION CONSULT NOTE - Initial Consult  Pharmacy Consult for heparin  Indication: DVT  No Known Allergies  Patient Measurements: Height: 5' (152.4 cm) Weight: 154 lb 4.8 oz (70 kg) IBW/kg (Calculated) : 45.5 Heparin Dosing Weight: 60kg  Vital Signs: Temp: 98.2 F (36.8 C) (05/15 0450) Temp Source: Oral (05/15 0450) BP: 113/59 (05/15 0700) Pulse Rate: 86 (05/15 0450)  Labs:  Recent Labs  05/18/16 2115 05/19/16 0234 05/19/16 0750  HGB 12.3  --  12.4  HCT 38.7  --  39.2  PLT 510*  --  505*  LABPROT 16.1*  --   --   INR 1.28  --   --   CREATININE 0.43* 0.42*  --     Estimated Creatinine Clearance: 48.1 mL/min (A) (by C-G formula based on SCr of 0.42 mg/dL (L)).   Medical History: Past Medical History:  Diagnosis Date  . ARTHRITIS   . Arthritis   . Diverticulosis   . HYPERTENSION   . HYPERTHYROIDISM   . Hyperthyroidism    s/p I-131 ablation 03/2011 of multinod goiter  . INSOMNIA   . MIGRAINE HEADACHE   . OBESITY   . Posttraumatic stress disorder   . Pulmonary embolism ([place]) 03/2014   with DVT  . Stroke [facility]) 03/2014   dysarthria    Medications:  Prescriptions Prior to Admission  Medication Sig Dispense Refill Last Dose  . acetaminophen (TYLENOL) 160 MG/5ML liquid Take 480 mg by mouth every 4 (four) hours as needed for fever or pain.   unk  . albuterol (PROVENTIL HFA) 108 (90 Base) MCG/ACT inhaler Inhale 2 puffs into the lungs every 6 (six) hours as needed for wheezing or shortness of breath. 1 Inhaler 1 unk  . diclofenac sodium (VOLTAREN) 1 % GEL APPLY (2GMS) TOPICALLY THREE TIMES DAILY. 300 g 5 05/19/2016 at Unknown time  . ELIQUIS 2.5 MG TABS tablet TAKE 1 TABLET TWICE A DAY 60 tablet 5 05/18/2016 at 2000  . gabapentin (NEURONTIN) 100 MG capsule Take 1 capsule (100 mg total) by mouth at bedtime.   05/19/2016 at Unknown time  . guaiFENesin (MUCINEX) 600 MG 12 hr tablet Take 1 tablet (600 mg total) by mouth 2 (two) times daily. (Patient taking  differently: Take 600 mg by mouth 2 (two) times daily as needed for cough or to loosen phlegm. ) 10 tablet 0 unk  . hydroxyurea (HYDREA) 500 MG capsule Take 2 capsules (1,000 mg total) by mouth daily. May take with food to minimize GI side effects. (Patient taking differently: Take 500 mg by mouth daily. ) 60 capsule 9 05/18/2016 at Unknown time  . ipratropium-albuterol (DUONEB) 0.5-2.5 (3) MG/3ML SOLN Take 3 mLs by nebulization every 6 (six) hours as needed. (Patient taking differently: Take 3 mLs by nebulization every 6 (six) hours as needed (shortness of breath). ) 360 mL 0 unk  . methimazole (TAPAZOLE) 5 MG tablet TAKE 1 TABLET BY MOUTH TWICE WEEKLY (Patient taking differently: TAKE 1 TABLET BY MOUTH TWICE WEEKLY (Sun and Thurs)) 12 tablet 2 05/17/2016  . polyethylene glycol (MIRALAX / GLYCOLAX) packet TAKE 17G BY MOUTH TWICE A DAY (Patient taking differently: TAKE 17G BY MOUTH TWICE A DAY AS NEED FOR CONSTIPATION) 14 packet 1 UNK  . Probiotic Product (ALIGN) 4 MG CAPS Take 1 capsule by mouth daily.    05/19/2016 at Unknown time  . propranolol (INDERAL) 10 MG tablet TAKE 1 TABLET TWICE A DAY 60 tablet 5 05/18/2016 at 1800  . PROTONIX 40 MG PACK TAKE 20 MLS (  40 MG TOTAL) BY MOUTH DAILY. (Patient taking differently: Take 40 mg by mouth daily. Mix with applesauce) 30 each 5 05/19/2016 at Unknown time  . terconazole (TERAZOL 7) 0.4 % vaginal cream Place 1 applicator vaginally at bedtime. (Patient taking differently: Place 1 applicator vaginally at bedtime as needed (irritation). ) 90 g 3 unk  . traMADol (ULTRAM) 50 MG tablet TAKE 2 TABLETS BY MOUTH 3 TIMES A DAY AS NEEDED (Patient taking differently: Take 100 mg by mouth 3 (three) times daily as needed for moderate pain. ) 180 tablet 1 05/19/2016 at Unknown time  . traZODone (DESYREL) 50 MG tablet TAKE 1 TABLET (50 MG TOTAL) BY MOUTH AT BEDTIME AS NEEDED. FOR SLEEP (Patient taking differently: Take 50 mg by mouth at bedtime. for sleep) 30 tablet 5 05/18/2016 at  Unknown time   Scheduled:  . acidophilus  1 capsule Oral Daily  . gabapentin  100 mg Oral QHS  . hydroxyurea  500 mg Oral Q breakfast  . [START ON 05/21/2016] methimazole  5 mg Oral Once per day on Sun Thu  . pantoprazole sodium  40 mg Oral Daily  . polyethylene glycol  17 g Oral Daily  . propranolol  10 mg Oral BID  . sodium chloride flush  3 mL Intravenous Q12H  . traZODone  50 mg Oral QHS    Assessment: 83 yo female with LLE DVT (also \\with  history of DVT/PE) and noted with abdominal wall hematoma. Pharmacy consulted to dose heparin -Hg= 12.4, plt= 505   Goal of Therapy:  Heparin level 0.3-0.7 units/ml Monitor platelets by anticoagulation protocol: Yes   Plan:  -No heparin bolus due to hematoma -begin heparin at 1000 units/hr -Heparin level in 8 hrs and daily with CBC daily  Hildred Laser, Pharm D 05/19/2016 1:07 PM

## 2016-05-19 NOTE — Care Management Note (Signed)
Case Management Note  Patient Details  Name: Sandy Barrett MRN: [id number] Date of Birth: [birth year]  Subjective/Objective:  Leg pain and swelling                   Action/Plan: Discharge Planning: NCM spoke to pt's dtr, [name] at bedside. She lives in home with patient. Pt has hospital bed, wheelchair, RW, bedside commode and hoyer lift. Had [place] in the past. Will continue to follow for dc needs.  PCP BURNS, Claudina Lick MD  Expected Discharge Date:                  Expected Discharge Plan:  [place]  In-House Referral:  NA  Discharge planning Services  CM Consult  Post Acute Care Choice:  Home Health Choice offered to:  Adult Children  DME Arranged:  N/A DME Agency:  NA  HH Arranged:    [place] Agency:     Status of Service:  In process, will continue to follow  If discussed at Long Length of Stay Meetings, dates discussed:    Additional Comments:  Erenest Rasher, RN 05/19/2016, 10:59 AM

## 2016-05-19 NOTE — Progress Notes (Signed)
ANTICOAGULATION CONSULT NOTE Pharmacy Consult for heparin  Indication: DVT  No Known Allergies  Patient Measurements: Height: 5' (152.4 cm) Weight: 154 lb 4.8 oz (70 kg) IBW/kg (Calculated) : 45.5 Heparin Dosing Weight: 60kg  Vital Signs: Temp: 98.2 F (36.8 C) (05/15 2055) Temp Source: Oral (05/15 2055) BP: 105/52 (05/15 2055) Pulse Rate: 87 (05/15 2055)  Labs:  Recent Labs  05/18/16 2115 05/19/16 0234 05/19/16 0750 05/19/16 2234 05/19/16 2238  HGB 12.3  --  12.4 11.6*  --   HCT 38.7  --  39.2 36.8  --   PLT 510*  --  505* 459*  --   APTT  --   --   --   --  78*  LABPROT 16.1*  --   --   --   --   INR 1.28  --   --   --   --   HEPARINUNFRC  --   --   --   --  0.57  CREATININE 0.43* 0.42*  --   --   --     Estimated Creatinine Clearance: 48.1 mL/min (A) (by C-G formula based on SCr of 0.42 mg/dL (L)).   Assessment: 83 yo female with LLE DVT for heparin  Goal of Therapy:  Heparin level 0.3-0.7 units/ml Monitor platelets by anticoagulation protocol: Yes   Plan:  Continue Heparin at current rate  Follow-up am labs.   Phillis Knack, PharmD, BCPS  05/19/2016 11:18 PM

## 2016-05-19 NOTE — H&P (Addendum)
History and Physical    Sandy Fujita VEL:[id number] DOB: [birth year] DOA: 05/18/2016  PCP: [name], MD   Patient coming from: PCP's office  Chief Complaint: Left leg pain and swelling  HPI: Sandy Barrett is a 83 y.o. woman with a history of CVA in March 2016 that has left her with right sided hemiparesis and expressive aphasia, prior DVT/PE (anticoagulated with Eliquis), hyperthyroidism with goiter (not considered a surgical candidate in the past), HTN, and recent hospitalization for aspiration pneumonia.  She lives with one of her daughters and has reportedly been back to her baseline state of health until she developed LLE pain and swelling.  She was actually seen in her PCP's office today and referred to the ED.  No associated chest pain or shortness of breath.  No fever.  No nausea or vomiting.   Her daughter reports that she has not missed any doses of Eliquis (until today) except when she was in the hospital in April.  Eliquis was held and lovenox was started on admission.  Eliquis was resumed at discharge.  She is nonambulatory at baseline.  Family uses a [employer] fast streamer.  Patient transfers from bed to wheelchair.  ED Course: Bedside U/S by ED attending concerning for possible DVT in the femoral vein.  However, CT of the abdomen and pelvis shows a large anterior abdominal wall hematoma.  She also has rectosigmoid impaction, several vertebral fractures (L2 fracture new since June 2017), and abnormal appearance of the right breast (among other chronic findings).    Hospitalist asked to place in observation while awaiting formal LE venous doppler in the AM.  Risks vs benefits of continued anticoagulation have been considered and discussed openly with the patient's daughter.  Based on the size of the abdominal wall hematoma, I have decided to defer anticoagulation with IV heparin until we get more data.  Her daughter is in agreement.  If DVT is proven, IVC filter may need to be  considered.  Review of Systems: Limited by aphasia.   Past Medical History:  Diagnosis Date  . ARTHRITIS   . Arthritis   . Diverticulosis   . HYPERTENSION   . HYPERTHYROIDISM   . Hyperthyroidism    s/p I-131 ablation 03/2011 of multinod goiter  . INSOMNIA   . MIGRAINE HEADACHE   . OBESITY   . Posttraumatic stress disorder   . Pulmonary embolism ([place]) 03/2014   with DVT  . Stroke [facility]) 03/2014   dysarthria    Past Surgical History:  Procedure Laterality Date  . ABDOMINAL HYSTERECTOMY  1976  . BREAST SURGERY     biopsy  . RADIOLOGY WITH ANESTHESIA Left 03/08/2014   Procedure: RADIOLOGY WITH ANESTHESIA;  Surgeon: Rob Hickman, MD;  Location: [place];  Service: Radiology;  Laterality: Left;  . TONSILLECTOMY    . TOTAL HIP ARTHROPLASTY  1998    right     reports that she has never smoked. She has never used smokeless tobacco. She reports that she does not drink alcohol or use drugs. She has two daughters.  She lives with one of them.  They share the medical decision making at this time.  No Known Allergies  Family History  Problem Relation Age of Onset  . Asthma Mother   . Asthma Father   . Prostate cancer Father   . Stroke Brother   . Breast cancer Sister   . Stomach cancer Sister   . Thyroid disease Neg Hx      Prior to  Admission medications   Medication Sig Start Date End Date Taking? Authorizing Provider  acetaminophen (TYLENOL) 160 MG/5ML liquid Take 480 mg by mouth every 4 (four) hours as needed for fever or pain.   Yes [provider]  albuterol (PROVENTIL HFA) 108 (90 Base) MCG/ACT inhaler Inhale 2 puffs into the lungs every 6 (six) hours as needed for wheezing or shortness of breath. 05/15/15  Yes Arnett, Yvetta Coder, FNP  diclofenac sodium (VOLTAREN) 1 % GEL APPLY (2GMS) TOPICALLY THREE TIMES DAILY. 01/30/16  Yes Burns, Claudina Lick, MD  ELIQUIS 2.5 MG TABS tablet TAKE 1 TABLET TWICE A DAY 05/18/16  Yes Burns, Claudina Lick, MD  gabapentin (NEURONTIN) 100 MG  capsule Take 1 capsule (100 mg total) by mouth at bedtime. 04/28/16  Yes Domenic Polite, MD  guaiFENesin (MUCINEX) 600 MG 12 hr tablet Take 1 tablet (600 mg total) by mouth 2 (two) times daily. Patient taking differently: Take 600 mg by mouth 2 (two) times daily as needed for cough or to loosen phlegm.  04/29/16  Yes Hosie Poisson, MD  hydroxyurea (HYDREA) 500 MG capsule Take 2 capsules (1,000 mg total) by mouth daily. May take with food to minimize GI side effects. Patient taking differently: Take 500 mg by mouth daily.  08/23/15  Yes Gorsuch, Ni, MD  ipratropium-albuterol (DUONEB) 0.5-2.5 (3) MG/3ML SOLN Take 3 mLs by nebulization every 6 (six) hours as needed. Patient taking differently: Take 3 mLs by nebulization every 6 (six) hours as needed (shortness of breath).  04/29/16  Yes Hosie Poisson, MD  methimazole (TAPAZOLE) 5 MG tablet TAKE 1 TABLET BY MOUTH TWICE WEEKLY Patient taking differently: TAKE 1 TABLET BY MOUTH TWICE WEEKLY (Sun and Thurs) 04/19/16  Yes Renato Shin, MD  polyethylene glycol (MIRALAX / GLYCOLAX) packet TAKE 17G BY MOUTH TWICE A DAY Patient taking differently: TAKE 17G BY MOUTH TWICE A DAY AS NEED FOR CONSTIPATION 03/12/16  Yes Burns, Claudina Lick, MD  Probiotic Product (ALIGN) 4 MG CAPS Take 1 capsule by mouth daily.    Yes [provider]  propranolol (INDERAL) 10 MG tablet TAKE 1 TABLET TWICE A DAY 12/02/15  Yes Burns, Claudina Lick, MD  PROTONIX 40 MG PACK TAKE 20 MLS (40 MG TOTAL) BY MOUTH DAILY. Patient taking differently: Take 40 mg by mouth daily. Mix with applesauce 02/13/16  Yes Burns, Claudina Lick, MD  terconazole (TERAZOL 7) 0.4 % vaginal cream Place 1 applicator vaginally at bedtime. Patient taking differently: Place 1 applicator vaginally at bedtime as needed (irritation).  04/18/15  Yes Harraway-Smith, Hoyle Sauer, MD  traMADol (ULTRAM) 50 MG tablet TAKE 2 TABLETS BY MOUTH 3 TIMES A DAY AS NEEDED Patient taking differently: Take 100 mg by mouth 3 (three) times daily as needed  for moderate pain.  05/04/16  Yes Burns, Claudina Lick, MD  traZODone (DESYREL) 50 MG tablet TAKE 1 TABLET (50 MG TOTAL) BY MOUTH AT BEDTIME AS NEEDED. FOR SLEEP Patient taking differently: Take 50 mg by mouth at bedtime. for sleep 02/13/16  Yes [name], MD    Physical Exam: Vitals:   05/18/16 2300 05/19/16 0000 05/19/16 0100 05/19/16 0131  BP: 107/86 105/75 108/66 138/80  Pulse: 89 91 89   Resp: 19 20 (!) 24 (!) 21  Temp:    98.4 F (36.9 C)  TempSrc:    Oral  SpO2: 97% 95% 95% 97%  Weight:  74.8 kg (164 lb 14.5 oz)  70 kg (154 lb 4.8 oz)  Height:    5' (1.524 m)  Constitutional: NAD, calm, comfortable, sleeping but arousable Vitals:   05/18/16 2300 05/19/16 0000 05/19/16 0100 05/19/16 0131  BP: 107/86 105/75 108/66 138/80  Pulse: 89 91 89   Resp: 19 20 (!) 24 (!) 21  Temp:    98.4 F (36.9 C)  TempSrc:    Oral  SpO2: 97% 95% 95% 97%  Weight:  74.8 kg (164 lb 14.5 oz)  70 kg (154 lb 4.8 oz)  Height:    5' (1.524 m)   Eyes: PERRL, lids and conjunctivae normal ENMT: Mucous membranes are moist. Posterior pharynx clear of any exudate or lesions. Normal dentition.  Neck: normal appearance, supple, no masses Respiratory: clear to auscultation bilaterally, no wheezing, no crackles. Normal respiratory effort. No accessory muscle use.  Cardiovascular: Normal rate, regular rhythm, no murmurs / rubs / gallops. No significant pitting in the left leg to me.  2+ pedal pulses.  GI: abdomen is obese but soft and compressible.  No distention.  No tenderness.  She has significant abdominal wall induration that goes across her entire anterior abdomen with bruising but no apparent tenderness.  Bowel sounds are present. Musculoskeletal:  Right sided hemiparesis.  Good ROM with left sided extremities.  No contractures. Normal muscle tone.  Skin: no rashes, warm and dry. Stage II sacral decubitus ulcer; present on admission. Neurologic: Right sided hemiparesis; aphasia.   Psychiatric: Oriented  to person.  Judgment impaired.  Inappropriate laughing.   Labs on Admission: I have personally reviewed following labs and imaging studies  CBC:  Recent Labs Lab 05/18/16 2115  WBC 6.3  NEUTROABS 4.2  HGB 12.3  HCT 38.7  MCV 110.6*  PLT 932*   Basic Metabolic Panel:  Recent Labs Lab 05/18/16 2115  NA 140  K 4.3  CL 110  CO2 22  GLUCOSE 95  BUN 14  CREATININE 0.43*  CALCIUM 9.0   GFR: Estimated Creatinine Clearance: 48.1 mL/min (A) (by C-G formula based on SCr of 0.43 mg/dL (L)).  Coagulation Profile:  Recent Labs Lab 05/18/16 2115  INR 1.28    Radiological Exams on Admission: Ct Abdomen Pelvis W Contrast  Result Date: 05/18/2016 CLINICAL DATA:  Abdominal mass. Poor historian. Left lower leg swelling. EXAM: CT ABDOMEN AND PELVIS WITH CONTRAST TECHNIQUE: Multidetector CT imaging of the abdomen and pelvis was performed using the standard protocol following bolus administration of intravenous contrast. CONTRAST:  123mL ISOVUE-300 IOPAMIDOL (ISOVUE-300) INJECTION 61% COMPARISON:  CT of the abdomen and pelvis on 06/27/2015 in CT of the chest on 04/21/2016 FINDINGS: Lower chest: The heart is enlarged. Numerous left chest wall collaterals, consistent with superior vena cava compression from goiter. Pectus excavatum. Hepatobiliary: The liver is normal in appearance. A 1 cm gallstone is present. Pancreas: Unremarkable. No pancreatic ductal dilatation or surrounding inflammatory changes. Spleen: Normal in size without focal abnormality. Adrenals/Urinary Tract: Adrenal glands are unremarkable. Normal enhancement and excretion from both kidneys. There are bilateral parapelvic cysts. Stomach/Bowel: The stomach is normal in appearance. No small bowel dilatation or bowel wall thickening. Anterior abdominal wall hernia contains nondilated loops of small bowel. Large bowel loops contain a large amount of stool. Impacted stool identified within the rectosigmoid colon. Vascular/Lymphatic:  There is atherosclerotic calcification of the abdominal aorta. No retroperitoneal or mesenteric adenopathy. Reproductive: Hysterectomy.  No adnexal mass. Other: Lobulated anterior abdominal wall mass is identified in the subcutaneous tissues extending from the left iliac crest, crossing midline. Mass measures approximately 23 x 3.9 x 4.2 cm. The density favors hematoma. The skin of the  right breast is markedly thickened, consistent with dependent edema and/or inflammatory malignancy. Musculoskeletal: Biconcave fracture of L3. Superior endplate fracture of L2, L1. Right hip arthroplasty. IMPRESSION: 1. Anterior abdominal wall mass is new since the previous exam. Given its size and density, hematoma is favored. Correlation with physical exam, ecchymosis, and history is recommended. 2. Rectosigmoid impaction. 3. Cardiomegaly. 4. Multiple venous collaterals, consistent with relative superior vena cava obstruction. Patient is known to have a large goiter with displacement and effacement of superior vena cava. 5. Bilateral parapelvic renal cysts. 6. Anterior abdominal wall hernia containing nondilated loops of small bowel. 7. Prior hysterectomy. 8. Numerous vertebral fractures ; L2 fracture is new since June of 2017. 9. Abnormal right breast consistent with edema or malignancy. Diagnostic evaluation with mammogram and ultrasound recommended. Electronically Signed   By: Nolon Nations M.D.   On: 05/18/2016 22:34    Assessment/Plan Principal Problem:   DVT (deep venous thrombosis) (HCC) Active Problems:   Hyperthyroidism   Essential hypertension   Multinodular goiter   Stroke, embolic (HCC)   Aphasia S/P CVA   Left leg swelling      Left leg pain and swelling concerning for DVT.  High risk for anticoagulation with bleeding complication after being on subQ lovenox during her last admission.  Remarkably, Hgb 12 upon admission (? Hemoconcentration); it was around 10 at her last discharge in April. --Venous  doppler in AM --HOLDing anticoagulation for now as noted above --Bed rest overnight --Will need to consider risk vs benefit of further anticoagulation if she has DVT.  Will need to consider IVC filter.  Hyperthyroidism with goiter --Continue home dose of methimazole, propranolol  Prior CVA with right sided hemiparesis and aphasia --Holding Eliquis for now  Thrombocytosis --Hydroxyurea  Fecal impaction --tap water enema --Miralax daily  L2 vertebral fracture --Noted.  Abnormal appearance of right breast --Defer additional work-up to the outpatient setting.  She will need follow-up with her PCP.  Chronic dysphagia --Aspiration precautions --Dysphagia 3 diet    DVT prophylaxis: To be determined, bleeding risk and DVT has not been ruled out. Code Status: Limited (no intubation) Family Communication: Daughter present in the ED at time of admission. Disposition Plan: To be determined. Consults called: NONE Admission status: Place in observation with telemetry monitoring.   TIME SPENT: 50 minutes   Eber Jones MD [facility] Pager [phone]  If 7PM-7AM, please contact night-coverage [email or web address] Password [email or web address]  05/19/2016, 1:39 AM

## 2016-05-19 NOTE — Progress Notes (Signed)
*  PRELIMINARY RESULTS* Vascular Ultrasound Lower extremity venous duplex has been completed.  Preliminary findings: Occlusive DVT noted in the Left common femoral, femoral, and profunda femoral veins. Partial DVT noted in the Left popliteal vein. Difficult to visualize calf veins due to patient position, but no obvious DVT. No DVT RLE.  IVC/left iliac duplex: Based on Color and pulsed wave Doppler, appears to be occlusive DVT in the left iliac vein. IVC appears patent.     Spoke with Dr. [place] Pink about LLE DVT. Confirmed IVC/iliac order.      Landry Mellow, RDMS, RVT  05/19/2016, 9:11 AM

## 2016-05-19 NOTE — Care Management Obs Status (Signed)
[place] NOTIFICATION   Patient Details  Name: Sandy Barrett MRN: [id number] Date of Birth: [birth year]   Medicare Observation Status Notification Given:  Yes    Erenest Rasher, RN 05/19/2016, 11:01 AM

## 2016-05-20 DIAGNOSIS — S301XXA Contusion of abdominal wall, initial encounter: Secondary | ICD-10-CM

## 2016-05-20 LAB — URINALYSIS, ROUTINE W REFLEX MICROSCOPIC
Glucose, UA: NEGATIVE mg/dL
Ketones, ur: NEGATIVE mg/dL
Nitrite: NEGATIVE
Protein, ur: 100 mg/dL — AB
Specific Gravity, Urine: 1.027 (ref 1.005–1.030)
Squamous Epithelial / LPF: NONE SEEN
pH: 6 (ref 5.0–8.0)

## 2016-05-20 LAB — BASIC METABOLIC PANEL
Anion gap: 6 (ref 5–15)
BUN: 11 mg/dL (ref 6–20)
CO2: 26 mmol/L (ref 22–32)
Calcium: 8.9 mg/dL (ref 8.9–10.3)
Chloride: 108 mmol/L (ref 101–111)
Creatinine, Ser: 0.34 mg/dL — ABNORMAL LOW (ref 0.44–1.00)
GFR calc Af Amer: >60 mL/min (ref >60)
GFR calc non Af Amer: >60 mL/min (ref >60)
Glucose, Bld: 103 mg/dL — ABNORMAL HIGH (ref 65–99)
Potassium: 4 mmol/L (ref 3.5–5.1)
Sodium: 140 mmol/L (ref 135–145)

## 2016-05-20 LAB — CBC
HCT: 37.5 % (ref 36.0–46.0)
Hemoglobin: 11.6 g/dL — ABNORMAL LOW (ref 12.0–15.0)
MCH: 33.7 pg (ref 26.0–34.0)
MCHC: 30.9 g/dL (ref 30.0–36.0)
MCV: 109 fL — ABNORMAL HIGH (ref 78.0–100.0)
Platelets: 497 10*3/uL — ABNORMAL HIGH (ref 150–400)
RBC: 3.44 MIL/uL — ABNORMAL LOW (ref 3.87–5.11)
RDW: 14.8 % (ref 11.5–15.5)
WBC: 6.7 10*3/uL (ref 4.0–10.5)

## 2016-05-20 LAB — HEPARIN LEVEL (UNFRACTIONATED): Heparin Unfractionated: 0.52 IU/mL (ref 0.30–0.70)

## 2016-05-20 MED ORDER — WARFARIN - PHARMACIST DOSING INPATIENT
Freq: Every day | Status: DC
  Administered 2016-05-20 – 2016-05-25 (×4)

## 2016-05-20 MED ORDER — CEPHALEXIN 500 MG PO CAPS
500.0000 mg | ORAL_CAPSULE | Freq: Three times a day (TID) | ORAL | Status: AC
  Administered 2016-05-20 – 2016-05-26 (×18): 500 mg via ORAL
  Filled 2016-05-20 (×18): qty 1

## 2016-05-20 MED ORDER — WARFARIN SODIUM 5 MG PO TABS
5.0000 mg | ORAL_TABLET | Freq: Once | ORAL | Status: AC
  Administered 2016-05-20: 5 mg via ORAL
  Filled 2016-05-20: qty 1

## 2016-05-20 MED ORDER — DICLOFENAC SODIUM 1 % TD GEL
2.0000 g | Freq: Three times a day (TID) | TRANSDERMAL | Status: DC
  Administered 2016-05-21 – 2016-05-26 (×13): 2 g via TOPICAL
  Filled 2016-05-20 (×3): qty 100

## 2016-05-20 NOTE — Progress Notes (Addendum)
Welcome for heparin, coumadin Indication: DVT  No Known Allergies  Patient Measurements: Height: 5' (152.4 cm) Weight: 154 lb 4.8 oz (70 kg) IBW/kg (Calculated) : 45.5 Heparin Dosing Weight: 60kg  Vital Signs: Temp: 97.4 F (36.3 C) (05/16 0622) Temp Source: Oral (05/16 0622) BP: 93/51 (05/16 0622) Pulse Rate: 87 (05/16 0622)  Labs:  Recent Labs  05/18/16 2115 05/19/16 0234 05/19/16 0750 05/19/16 2234 05/19/16 2238 05/20/16 0259  HGB 12.3  --  12.4 11.6*  --  11.6*  HCT 38.7  --  39.2 36.8  --  37.5  PLT 510*  --  505* 459*  --  497*  APTT  --   --   --   --  78*  --   LABPROT 16.1*  --   --   --   --   --   INR 1.28  --   --   --   --   --   HEPARINUNFRC  --   --   --   --  0.57 0.52  CREATININE 0.43* 0.42*  --   --   --  0.34*    Estimated Creatinine Clearance: 48.1 mL/min (A) (by C-G formula based on SCr of 0.34 mg/dL (L)).   Scheduled:  . acidophilus  1 capsule Oral Daily  . gabapentin  100 mg Oral QHS  . hydroxyurea  500 mg Oral Q breakfast  . [START ON 05/21/2016] methimazole  5 mg Oral Once per day on Sun Thu  . pantoprazole sodium  40 mg Oral Daily  . polyethylene glycol  17 g Oral Daily  . propranolol  10 mg Oral BID  . sodium chloride flush  3 mL Intravenous Q12H  . traZODone  50 mg Oral QHS    Assessment: 83 yo female with LLE DVT (also with history of DVT/PE on apixiban PTA) and noted with abdominal wall hematoma. Pharmacy consulted to dose heparin and adding coumadin today (Day 1 overlap). -Hg= 11.6, plt= 497 -INR= 1.28   Goal of Therapy:  Heparin level 0.3-0.7 units/ml Monitor platelets by anticoagulation protocol: Yes   Plan:  -No heparin changes needed -Heparin level daily with CBC daily -Coumadin 5mg  po today -Daily PT/INR  Hildred Laser, Pharm D 05/20/2016 8:41 AM

## 2016-05-20 NOTE — Consult Note (Addendum)
   [facility] CM Inpatient Consult   05/20/2016  Sandy Barrett 1932-08-26 [id number]      Patient screened for potential [facility] Care Management services. Went to bedside to speak with [name] and daughter, [name]. Explained [facility] Management program to daughter and patient in detail. Explained the difference between home health and [facility].  [name] (daughter) reports patient has [place] and she feels that is all they need at this time. Declined [facility] Care Management program services.  Daughter states they are in the process of looking for another handicap [name] as the previous one was totaled. States her mom utilizes [employer] but it often takes a long time to get where she needs to go. Otherwise, daughter reports that is the only concern they have currently outside of what home health is already providing.  Accepted [facility] Care Management brochure and contact information to call in the future should she need [facility] Management services.  Made inpatient RNCM aware of the above.  [name], MSN-Ed, RN,BSN [facility] Liaison [phone]

## 2016-05-20 NOTE — Progress Notes (Addendum)
[facility] PROGRESS NOTE  Sandy Barrett RSW:[id number] DOB: [birth year] DOA: 05/18/2016  PCP: [name], MD  Brief History/Interval Summary: 83 year old African-American female with a past medical history of a stroke in March 2016, resulting in right-sided hemiparesis and expressive aphasia, history of prior DVT and PE for which we. She was anticoagulated with an request, history of goiter, not a surgical candidate, history of hypertension and recent hospitalization for aspiration pneumonia. Patient lives with her daughter. She is nonambulatory. She sought attention due to worsening swelling in her left leg. Family also noted swelling in her abdomen.  Reason for Visit: Acute left leg DVT. Abdominal wall hematoma.  Consultants: Phone conversation with general surgery and vascular surgery and hematology  Procedures:   Lower extremity venous Doppler Occlusive DVT noted in the Left common femoral, femoral, and profunda femoral veins. Partial DVT noted in the Left popliteal vein. Difficult to visualize calf veins due to patient position, but no obvious DVT. No DVT RLE. IVC/left iliac duplex: Based on Color and pulsed wave Doppler, appears to be occlusive DVT in the left iliac vein. IVC appears patent.   Antibiotics: None  Subjective/Interval History: Difficult to communicate with patient due to her aphasia. She denied any pain issues. Daughter mentions that patient has had a few episodes of incontinence and she suspects that patient has a urinary tract infection.  ROS: Difficult to do due to her aphasia  Objective:  Vital Signs  Vitals:   05/19/16 0700 05/19/16 1328 05/19/16 2055 05/20/16 0622  BP: (!) 113/59 107/70 (!) 105/52 (!) 93/51  Pulse:  77 87 87  Resp:  18 18 18   Temp:  98.3 F (36.8 C) 98.2 F (36.8 C) 97.4 F (36.3 C)  TempSrc:  Oral Oral Oral  SpO2:  94% 95% 94%  Weight:      Height:        Intake/Output Summary (Last 24 hours) at 05/20/16  0939 Last data filed at 05/20/16 0938  Gross per 24 hour  Intake              240 ml  Output                0 ml  Net              240 ml   Filed Weights   05/19/16 0000 05/19/16 0131  Weight: 74.8 kg (164 lb 14.5 oz) 70 kg (154 lb 4.8 oz)    General appearance: Awake, alert. No distress. Resp: Clear to auscultation bilaterally. No wheezing, rales or rhonchi. Cardio: S1, S2 is normal, regular. No S3, S4. No rubs, murmurs or bruit. GI: Abdomen is soft. Firm mass noted in the lower abdomen and appears to be in the abdominal wall. Extending from left to right across midline. Nontender. No change in size.  Extremities: Left leg continues to be swollen as before. Neurologic: Right hemiparesis. Aphasic  Lab Results:  Data Reviewed: I have personally reviewed following labs and imaging studies  CBC:  Recent Labs Lab 05/18/16 2115 05/19/16 0750 05/19/16 2234 05/20/16 0259  WBC 6.3 7.5 7.0 6.7  NEUTROABS 4.2  --   --   --   HGB 12.3 12.4 11.6* 11.6*  HCT 38.7 39.2 36.8 37.5  MCV 110.6* 110.4* 109.9* 109.0*  PLT 510* 505* 459* 497*    Basic Metabolic Panel:  Recent Labs Lab 05/18/16 2115 05/19/16 0234 05/20/16 0259  NA 140 143 140  K 4.3 4.0 4.0  CL 110 110 108  CO2 22 26 26   GLUCOSE 95 98 103*  BUN 14 11 11   CREATININE 0.43* 0.42* 0.34*  CALCIUM 9.0 9.1 8.9    GFR: Estimated Creatinine Clearance: 48.1 mL/min (A) (by C-G formula based on SCr of 0.34 mg/dL (L)).  Coagulation Profile:  Recent Labs Lab 05/18/16 2115  INR 1.28     Radiology Studies: Ct Abdomen Pelvis W Contrast  Result Date: 05/18/2016 CLINICAL DATA:  Abdominal mass. Poor historian. Left lower leg swelling. EXAM: CT ABDOMEN AND PELVIS WITH CONTRAST TECHNIQUE: Multidetector CT imaging of the abdomen and pelvis was performed using the standard protocol following bolus administration of intravenous contrast. CONTRAST:  174mL ISOVUE-300 IOPAMIDOL (ISOVUE-300) INJECTION 61% COMPARISON:  CT of the  abdomen and pelvis on 06/27/2015 in CT of the chest on 04/21/2016 FINDINGS: Lower chest: The heart is enlarged. Numerous left chest wall collaterals, consistent with superior vena cava compression from goiter. Pectus excavatum. Hepatobiliary: The liver is normal in appearance. A 1 cm gallstone is present. Pancreas: Unremarkable. No pancreatic ductal dilatation or surrounding inflammatory changes. Spleen: Normal in size without focal abnormality. Adrenals/Urinary Tract: Adrenal glands are unremarkable. Normal enhancement and excretion from both kidneys. There are bilateral parapelvic cysts. Stomach/Bowel: The stomach is normal in appearance. No small bowel dilatation or bowel wall thickening. Anterior abdominal wall hernia contains nondilated loops of small bowel. Large bowel loops contain a large amount of stool. Impacted stool identified within the rectosigmoid colon. Vascular/Lymphatic: There is atherosclerotic calcification of the abdominal aorta. No retroperitoneal or mesenteric adenopathy. Reproductive: Hysterectomy.  No adnexal mass. Other: Lobulated anterior abdominal wall mass is identified in the subcutaneous tissues extending from the left iliac crest, crossing midline. Mass measures approximately 23 x 3.9 x 4.2 cm. The density favors hematoma. The skin of the right breast is markedly thickened, consistent with dependent edema and/or inflammatory malignancy. Musculoskeletal: Biconcave fracture of L3. Superior endplate fracture of L2, L1. Right hip arthroplasty. IMPRESSION: 1. Anterior abdominal wall mass is new since the previous exam. Given its size and density, hematoma is favored. Correlation with physical exam, ecchymosis, and history is recommended. 2. Rectosigmoid impaction. 3. Cardiomegaly. 4. Multiple venous collaterals, consistent with relative superior vena cava obstruction. Patient is known to have a large goiter with displacement and effacement of superior vena cava. 5. Bilateral parapelvic  renal cysts. 6. Anterior abdominal wall hernia containing nondilated loops of small bowel. 7. Prior hysterectomy. 8. Numerous vertebral fractures ; L2 fracture is new since June of 2017. 9. Abnormal right breast consistent with edema or malignancy. Diagnostic evaluation with mammogram and ultrasound recommended. Electronically Signed   By: Nolon Nations M.D.   On: 05/18/2016 22:34     Medications:  Scheduled: . acidophilus  1 capsule Oral Daily  . gabapentin  100 mg Oral QHS  . hydroxyurea  500 mg Oral Q breakfast  . [START ON 05/21/2016] methimazole  5 mg Oral Once per day on Sun Thu  . pantoprazole sodium  40 mg Oral Daily  . polyethylene glycol  17 g Oral Daily  . propranolol  10 mg Oral BID  . sodium chloride flush  3 mL Intravenous Q12H  . traZODone  50 mg Oral QHS   Continuous: . heparin 1,000 Units/hr (05/20/16 7169)   CVE:[id number] **OR** acetaminophen, HYDROcodone-acetaminophen, ipratropium-albuterol, traMADol  Assessment/Plan:  Principal Problem:   DVT (deep venous thrombosis) (HCC) Active Problems:   Hyperthyroidism   Essential hypertension   Multinodular goiter   Stroke, embolic (HCC)   Aphasia S/P CVA   Left  leg swelling     Acute DVT involving left lower extremity This seemed to have occurred while the patient was on Eliquis at home. Patient's daughter mentions that patient has been compliant. Patient is nonambulatory due to history of stroke. She has a history of thrombocytosis. Management of DVT discussed briefly with Dr. Bridgett Larsson with vascular surgery. No role for thrombolytics. Abdominal wall hematoma discussed with Dr. Ninfa Linden with general surgery. No role for any surgical intervention. Okay to initiate anticoagulation. Patient was started on IV heparin. Anticoagulation discussed in detail with patient's daughter. She is wary of going back on warfarin. Discussed with Dr. Alvy Bimler with hematology who follows the patient in her office. She also agrees that  warfarin is the only way to go at this point in time. Discussed again with daughter and she is agreeable. Patient has an appointment with Dr. Alvy Bimler on June 1. Initiate warfarin. Patient was on warfarin up until January of this year when it was changed over to Eliquis due to vaginal spotting. Patient never saw GYN. If she develops vaginal bleeding and spotting she will need to see a GYN.  Abdominal wall hematoma Detected on CT scan. Quite extensive hematoma. Occurred during previous hospitalization. She was getting Lovenox injections during that stay. Discussed with general surgery (Dr. Ninfa Linden). No role for acute intervention. May need to be seen by general surgery in a month in their office for this to be drained. Hemoglobin stable.  Urinary tract infection UA is noted to be abnormal. Previous culture data reviewed. Initiate Keflex.  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 Asymptomatic for the most part. Not a surgical candidate. Seen by ENT during previous hospitalization. Continue home medications.  History of stroke with right-sided hemiparesis and aphasia. Stable. Continue home medications.  History of thrombocytosis. Continue hydroxyurea. Followed by Dr. Alvy Bimler. Follow-up appointment on June 1.  Fecal impaction. Enema was given. Laxatives daily.  L2 vertebral fracture. Stable. Appears to be asymptomatic.  Abnormal appearance of the skin over right breast. Incidentally detected on CT scan. Previous mammogram report reviewed. This was done in April 2017. Progressive skin thickening was noted at that time as well. Biopsy was recommended. Patient did undergo Punch biopsy in May 2017, which showed inflammatory cells. No malignant cells reported.  Chronic dysphagia. Aspiration precautions. Dysphagia diet.  DVT Prophylaxis: IV heparin.    Code Status: Partial code  Family Communication: Discussed  with patient's daughter  Disposition Plan: Management as outlined above. Initiate warfarin. We will need to wait till INR is therapeutic.    LOS: 1 day   [facility] Pager [phone] 05/20/2016, 9:39 AM  If 7PM-7AM, please contact night-coverage at [email or web address], password [facility]

## 2016-05-21 LAB — CBC
HCT: 36.2 % (ref 36.0–46.0)
Hemoglobin: 11.4 g/dL — ABNORMAL LOW (ref 12.0–15.0)
MCH: 34.7 pg — ABNORMAL HIGH (ref 26.0–34.0)
MCHC: 31.5 g/dL (ref 30.0–36.0)
MCV: 110 fL — ABNORMAL HIGH (ref 78.0–100.0)
Platelets: 480 10*3/uL — ABNORMAL HIGH (ref 150–400)
RBC: 3.29 MIL/uL — ABNORMAL LOW (ref 3.87–5.11)
RDW: 15.1 % (ref 11.5–15.5)
WBC: 6 10*3/uL (ref 4.0–10.5)

## 2016-05-21 LAB — HEPARIN LEVEL (UNFRACTIONATED): Heparin Unfractionated: 0.57 IU/mL (ref 0.30–0.70)

## 2016-05-21 LAB — PROTIME-INR
INR: 1.31
Prothrombin Time: 16.3 seconds — ABNORMAL HIGH (ref 11.4–15.2)

## 2016-05-21 MED ORDER — WARFARIN SODIUM 5 MG PO TABS
5.0000 mg | ORAL_TABLET | Freq: Once | ORAL | Status: AC
  Administered 2016-05-21: 5 mg via ORAL
  Filled 2016-05-21: qty 1

## 2016-05-21 NOTE — Care Management Note (Signed)
Case Management Note Previous CM note initiated by Erenest Rasher, RN 05/19/2016, 10:59 AM   Patient Details  Name: Adilee Lemme MRN: [id number] Date of Birth: 07-Jan-1932  Subjective/Objective:  Leg pain and swelling                   Action/Plan: Discharge Planning: NCM spoke to pt's dtr, [name] at bedside. She lives in home with patient. Pt has hospital bed, wheelchair, RW, bedside commode and hoyer lift. Had [place] in the past. Will continue to follow for dc needs.  PCP BURNS, Claudina Lick MD  Expected Discharge Date:                  Expected Discharge Plan:  [place]  In-House Referral:  NA  Discharge planning Services  CM Consult  Post Acute Care Choice:  Home Health, Resumption of Svcs/PTA Provider Choice offered to:  Adult Children, Patient  DME Arranged:  N/A DME Agency:  NA  HH Arranged:  RN, PT, OT, Nurse's Aide [place] Agency:  [facility]  Status of Service:  In process, will continue to follow  If discussed at Long Length of Stay Meetings, dates discussed:    Additional Comments:  05/21/16- 1400- Claxton Levitz RN, CM- spoke with Santiago Glad at [facility]- pt active with them PTA for [facility] services- RN/PT/OT/aide- will need resumption orders at time of discharge. CM to follow  Dawayne Patricia, RN 05/21/2016, 2:19 PM [phone]

## 2016-05-21 NOTE — Progress Notes (Signed)
[place] for heparin, coumadin Indication: DVT  No Known Allergies  Patient Measurements: Height: 5' (152.4 cm) Weight: 154 lb 4.8 oz (70 kg) IBW/kg (Calculated) : 45.5 Heparin Dosing Weight: 60kg  Vital Signs: Temp: 98.3 F (36.8 C) (05/17 0635) Temp Source: Oral (05/17 0635) BP: 94/55 (05/17 0635) Pulse Rate: 76 (05/17 0635)  Labs:  Recent Labs  05/18/16 2115 05/19/16 0234  05/19/16 2234 05/19/16 2238 05/20/16 0259 05/21/16 0253  HGB 12.3  --   < > 11.6*  --  11.6* 11.4*  HCT 38.7  --   < > 36.8  --  37.5 36.2  PLT 510*  --   < > 459*  --  497* 480*  APTT  --   --   --   --  78*  --   --   LABPROT 16.1*  --   --   --   --   --  16.3*  INR 1.28  --   --   --   --   --  1.31  HEPARINUNFRC  --   --   --   --  0.57 0.52 0.57  CREATININE 0.43* 0.42*  --   --   --  0.34*  --   < > = values in this interval not displayed.  Estimated Creatinine Clearance: 48.1 mL/min (A) (by C-G formula based on SCr of 0.34 mg/dL (L)).   Scheduled:  . acidophilus  1 capsule Oral Daily  . cephALEXin  500 mg Oral Q8H  . diclofenac sodium  2 g Topical TID  . gabapentin  100 mg Oral QHS  . hydroxyurea  500 mg Oral Q breakfast  . methimazole  5 mg Oral Once per day on Sun Thu  . pantoprazole sodium  40 mg Oral Daily  . polyethylene glycol  17 g Oral Daily  . propranolol  10 mg Oral BID  . sodium chloride flush  3 mL Intravenous Q12H  . traZODone  50 mg Oral QHS  . Warfarin - Pharmacist Dosing Inpatient   Does not apply q1800    Assessment: 83 yo female with LLE DVT (also with history of DVT/PE on apixiban PTA) and noted with abdominal wall hematoma. Pharmacy consulted to dose heparin and coumadin (Day 2 overlap). -Heparin level= 0.57, INR= 1.37   Goal of Therapy:  Heparin level 0.3-0.7 units/ml Monitor platelets by anticoagulation protocol: Yes   Plan:  -No heparin changes needed -Heparin level daily with CBC daily -Coumadin 5mg  po  today -Daily PT/INR  Hildred Laser, Pharm D 05/21/2016 8:54 AM

## 2016-05-21 NOTE — Discharge Instructions (Addendum)
Deep Vein Thrombosis A deep vein thrombosis (DVT) is a blood clot (thrombus) that usually occurs in a deep, larger vein of the lower leg or the pelvis, or in an upper extremity such as the arm. These are dangerous and can lead to serious and even life-threatening complications if the clot travels to the lungs. A DVT can damage the valves in your leg veins so that instead of flowing upward, the blood pools in the lower leg. This is called post-thrombotic syndrome, and it can result in pain, swelling, discoloration, and sores on the leg. What are the causes? A DVT is caused by the formation of a blood clot in your leg, pelvis, or arm. Usually, several things contribute to the formation of blood clots. A clot may develop when:  Your blood flow slows down.  Your vein becomes damaged in some way.  You have a condition that makes your blood clot more easily. What increases the risk? A DVT is more likely to develop in:  People who are older, especially over 47 years of age.  People who are overweight (obese).  People who sit or lie still for a long time, such as during long-distance travel (over 4 hours), bed rest, hospitalization, or during recovery from certain medical conditions like a stroke.  People who do not engage in much physical activity (sedentary lifestyle).  People who have chronic breathing disorders.  People who have a personal or family history of blood clots or blood clotting disease.  People who have peripheral vascular disease (PVD), diabetes, or some types of cancer.  People who have heart disease, especially if the person had a recent heart attack or has congestive heart failure.  People who have neurological diseases that affect the legs (leg paresis).  People who have had a traumatic injury, such as breaking a hip or leg.  People who have recently had major or lengthy surgery, especially on the hip, knee, or abdomen.  People who have had a central line placed  inside a large vein.  People who take medicines that contain the hormone estrogen. These include birth control pills and hormone replacement therapy.  Pregnancy or during childbirth or the postpartum period.  Long plane flights (over 8 hours). What are the signs or symptoms?   Symptoms of a DVT can include:  Swelling of your leg or arm, especially if one side is much worse.  Warmth and redness of your leg or arm, especially if one side is much worse.  Pain in your arm or leg. If the clot is in your leg, symptoms may be more noticeable or worse when you stand or walk.  A feeling of pins and needles, if the clot is in the arm. The symptoms of a DVT that has traveled to the lungs (pulmonary embolism, PE) usually start suddenly and include:  Shortness of breath while active or at rest.  Coughing or coughing up blood or blood-tinged mucus.  Chest pain that is often worse with deep breaths.  Rapid or irregular heartbeat.  Feeling light-headed or dizzy.  Fainting.  Feeling anxious.  Sweating. There may also be pain and swelling in a leg if that is where the blood clot started. These symptoms may represent a serious problem that is an emergency. Do not wait to see if the symptoms will go away. Get medical help right away. Call your local emergency services (911 in the [place] Do not drive yourself to the hospital.  How is this diagnosed? Your health care provider  will take a medical history and perform a physical exam. You may also have other tests, including:  Blood tests to assess the clotting properties of your blood.  Imaging tests, such as CT, ultrasound, MRI, X-ray, and other tests to see if you have clots anywhere in your body. How is this treated? After a DVT is identified, it can be treated. The type of treatment that you receive depends on many factors, such as the cause of your DVT, your risk for bleeding or developing more clots, and other medical conditions that you  have. Sometimes, a combination of treatments is necessary. Treatment options may be combined and include:  Monitoring the blood clot with ultrasound.  Taking medicines by mouth, such as newer blood thinners (anticoagulants), thrombolytics, or warfarin.  Taking anticoagulant medicine by injection or through an IV tube.  Wearing compression stockings or using different types ofdevices.  Surgery (rare) to remove the blood clot or to place a filter in your abdomen to stop the blood clot from traveling to your lungs. Treatments for a DVT are often divided into immediate treatment and long-term treatment (up to 3 months after DVT). You can work with your health care provider to choose the treatment program that is best for you. Follow these instructions at home: If you are taking a newer oral anticoagulant:   Take the medicine every single day at the same time each day.  Understand what foods and drugs interact with this medicine.  Understand that there are no regular blood tests required when using this medicine.  Understand the side effects of this medicine, including excessive bruising or bleeding. Ask your health care provider or pharmacist about other possible side effects. If you are taking warfarin:   Understand how to take warfarin and know which foods can affect how warfarin works in Veterinary surgeon.  Understand that it is dangerous to take too much or too little warfarin. Too much warfarin increases the risk of bleeding. Too little warfarin continues to allow the risk for blood clots.  Follow your PT and INR blood testing schedule. The PT and INR results allow your health care provider to adjust your dose of warfarin. It is very important that you have your PT and INR tested as often as told by your health care provider.  Avoid major changes in your diet, or tell your health care provider before you change your diet. Arrange a visit with a registered dietitian to answer your questions.  Many foods, especially foods that are high in vitamin K, can interfere with warfarin and affect the PT and INR results. Eat a consistent amount of foods that are high in vitamin K, such as:  Spinach, kale, broccoli, cabbage, collard greens, turnip greens, Brussels sprouts, peas, cauliflower, seaweed, and parsley.  Beef liver and pork liver.  Green tea.  Soybean oil.  Tell your health care provider about any and all medicines, vitamins, and supplements that you take, including aspirin and other over-the-counter anti-inflammatory medicines. Be especially cautious with aspirin and anti-inflammatory medicines. Do not take those before you ask your health care provider if it is safe to do so. This is important because many medicines can interfere with warfarin and affect the PT and INR results.  Do not start or stop taking any over-the-counter or prescription medicine unless your health care provider or pharmacist tells you to do so. If you take warfarin, you will also need to do these things:  Hold pressure over cuts for longer than usual.  Tell your dentist and other health care providers that you are taking warfarin before you have any procedures in which bleeding may occur.  Avoid alcohol or drink very small amounts. Tell your health care provider if you change your alcohol intake.  Do not use tobacco products, including cigarettes, chewing tobacco, and e-cigarettes. If you need help quitting, ask your health care provider.  Avoid contact sports. General instructions   Take over-the-counter and prescription medicines only as told by your health care provider. Anticoagulant medicines can have side effects, including easy bruising and difficulty stopping bleeding. If you are prescribed an anticoagulant, you will also need to do these things:  Hold pressure over cuts for longer than usual.  Tell your dentist and other health care providers that you are taking anticoagulants before you have  any procedures in which bleeding may occur.  Avoid contact sports.  Wear a medical alert bracelet or carry a medical alert card that says you have had a PE.  Ask your health care provider how soon you can go back to your normal activities. Stay active to prevent new blood clots from forming.  Make sure to exercise while traveling or when you have been sitting or standing for a long period of time. It is very important to exercise. Exercise your legs by walking or by tightening and relaxing your leg muscles often. Take frequent walks.  Wear compression stockings as told by your health care provider to help prevent more blood clots from forming.  Do not use tobacco products, including cigarettes, chewing tobacco, and e-cigarettes. If you need help quitting, ask your health care provider.  Keep all follow-up appointments with your health care provider. This is important. How is this prevented? Take these actions to decrease your risk of developing another DVT:  Exercise regularly. For at least 30 minutes every day, engage in:  Activity that involves moving your arms and legs.  Activity that encourages good blood flow through your body by increasing your heart rate.  Exercise your arms and legs every hour during long-distance travel (over 4 hours). Drink plenty of water and avoid drinking alcohol while traveling.  Avoid sitting or lying in bed for long periods of time without moving your legs.  Maintain a weight that is appropriate for your height. Ask your health care provider what weight is healthy for you.  If you are a woman who is over 39 years of age, avoid unnecessary use of medicines that contain estrogen. These include birth control pills.  Do not smoke, especially if you take estrogen medicines. If you need help quitting, ask your health care provider. If you are hospitalized, prevention measures may include:  Early walking after surgery, as soon as your health care provider  says that it is safe.  Receiving anticoagulants to prevent blood clots.If you cannot take anticoagulants, other options may be available, such as wearing compression stockings or using different types of devices. Get help right away if:  You have new or increased pain, swelling, or redness in an arm or leg.  You have numbness or tingling in an arm or leg.  You have shortness of breath while active or at rest.  You have chest pain.  You have a rapid or irregular heartbeat.  You feel light-headed or dizzy.  You cough up blood.  You notice blood in your vomit, bowel movement, or urine. These symptoms may represent a serious problem that is an emergency. Do not wait to see if the symptoms will  go away. Get medical help right away. Call your local emergency services (911 in the [place] Do not drive yourself to the hospital. This information is not intended to replace advice given to you by your health care provider. Make sure you discuss any questions you have with your health care provider. Document Released: 12/22/2004 Document Revised: 05/30/2015 Document Reviewed: 04/18/2014 Elsevier Interactive Patient Education  2017 [place] on my medicine - Coumadin   (Warfarin)  This medication education was reviewed with me or my healthcare representative as part of my discharge preparation.  The pharmacist that spoke with me during my hospital stay was:  Dareen Piano, [facility]  Why was Coumadin prescribed for you? Coumadin was prescribed for you because you have a blood clot or a medical condition that can cause an increased risk of forming blood clots. Blood clots can cause serious health problems by blocking the flow of blood to the heart, lung, or brain. Coumadin can prevent harmful blood clots from forming. As a reminder your indication for Coumadin is:   Deep Vein Thrombosis Treatment  What test will check on my response to Coumadin? While on Coumadin (warfarin)  you will need to have an INR test regularly to ensure that your dose is keeping you in the desired range. The INR (international normalized ratio) number is calculated from the result of the laboratory test called prothrombin time (PT).  If an INR APPOINTMENT HAS NOT ALREADY BEEN MADE FOR YOU please schedule an appointment to have this lab work done by your health care provider within 7 days. Your INR goal is usually a number between:  2 to 3 or your provider may give you a more narrow range like 2-2.5.  Ask your health care provider during an office visit what your goal INR is.  What  do you need to  know  About  COUMADIN? Take Coumadin (warfarin) exactly as prescribed by your healthcare provider about the same time each day.  DO NOT stop taking without talking to the doctor who prescribed the medication.  Stopping without other blood clot prevention medication to take the place of Coumadin may increase your risk of developing a new clot or stroke.  Get refills before you run out.  What do you do if you miss a dose? If you miss a dose, take it as soon as you remember on the same day then continue your regularly scheduled regimen the next day.  Do not take two doses of Coumadin at the same time.  Important Safety Information A possible side effect of Coumadin (Warfarin) is an increased risk of bleeding. You should call your healthcare provider right away if you experience any of the following: ? Bleeding from an injury or your nose that does not stop. ? Unusual colored urine (red or dark brown) or unusual colored stools (red or black). ? Unusual bruising for unknown reasons. ? A serious fall or if you hit your head (even if there is no bleeding).  Some foods or medicines interact with Coumadin (warfarin) and might alter your response to warfarin. To help avoid this: ? Eat a balanced diet, maintaining a consistent amount of Vitamin K. ? Notify your provider about major diet changes you plan to  make. ? Avoid alcohol or limit your intake to 1 drink for women and 2 drinks for men per day. (1 drink is 5 oz. wine, 12 oz. beer, or 1.5 oz. liquor.)  Make sure that ANY health care provider  who prescribes medication for you knows that you are taking Coumadin (warfarin).  Also make sure the healthcare provider who is monitoring your Coumadin knows when you have started a new medication including herbals and non-prescription products.  Coumadin (Warfarin)  Major Drug Interactions  Increased Warfarin Effect Decreased Warfarin Effect  Alcohol (large quantities) Antibiotics (esp. Septra/Bactrim, Flagyl, Cipro) Amiodarone (Cordarone) Aspirin (ASA) Cimetidine (Tagamet) Megestrol (Megace) NSAIDs (ibuprofen, naproxen, etc.) Piroxicam (Feldene) Propafenone (Rythmol SR) Propranolol (Inderal) Isoniazid (INH) Posaconazole (Noxafil) Barbiturates (Phenobarbital) Carbamazepine (Tegretol) Chlordiazepoxide (Librium) Cholestyramine (Questran) Griseofulvin Oral Contraceptives Rifampin Sucralfate (Carafate) Vitamin K   Coumadin (Warfarin) Major Herbal Interactions  Increased Warfarin Effect Decreased Warfarin Effect  Garlic Ginseng Ginkgo biloba Coenzyme Q10 Green tea [place]. Johns wort    Coumadin (Warfarin) FOOD Interactions  Eat a consistent number of servings per week of foods HIGH in Vitamin K (1 serving =  cup)  Collards (cooked, or boiled & drained) Kale (cooked, or boiled & drained) Mustard greens (cooked, or boiled & drained) Parsley *serving size only =  cup Spinach (cooked, or boiled & drained) Swiss chard (cooked, or boiled & drained) Turnip greens (cooked, or boiled & drained)  Eat a consistent number of servings per week of foods MEDIUM-HIGH in Vitamin K (1 serving = 1 cup)  Asparagus (cooked, or boiled & drained) Broccoli (cooked, boiled & drained, or raw & chopped) Brussel sprouts (cooked, or boiled & drained) *serving size only =  cup Lettuce, raw (green leaf,  endive, romaine) Spinach, raw Turnip greens, raw & chopped   These websites have more information on Coumadin (warfarin):  FailFactory.se; VeganReport.com.au;

## 2016-05-21 NOTE — Progress Notes (Signed)
Pts family member advised by multiple staff that straight back brown chairs in room are a safety risk to this patient who is not able to hold herself upright.  Family member insistent that Pt be at 26 degrees to eat, and refuses recliner stating that patient "will holler" if her legs are elevated.  Pt's RN, 2 NT, and myself the charge RN used the lift to place Pt in straight back chair, with assurance that family member will remain with Pt at all times.  Plan to use lift to place Pt back in bed after lunch.

## 2016-05-21 NOTE — Progress Notes (Signed)
[facility] PROGRESS NOTE  Sandy Barrett EUM:[id number] DOB: 09/10/1932 DOA: 05/18/2016  PCP: [name], MD  Brief History/Interval Summary: 83 year old African-American female with a past medical history of a stroke in March 2016, resulting in right-sided hemiparesis and expressive aphasia, history of prior DVT and PE for which we. She was anticoagulated with an request, history of goiter, not a surgical candidate, history of hypertension and recent hospitalization for aspiration pneumonia. Patient lives with her daughter. She is nonambulatory. She sought attention due to worsening swelling in her left leg. Family also noted swelling in her abdomen.  Reason for Visit: Acute left leg DVT. Abdominal wall hematoma.  Consultants: Phone conversation with general surgery and vascular surgery and hematology  Procedures:   Lower extremity venous Doppler Occlusive DVT noted in the Left common femoral, femoral, and profunda femoral veins. Partial DVT noted in the Left popliteal vein. Difficult to visualize calf veins due to patient position, but no obvious DVT. No DVT RLE. IVC/left iliac duplex: Based on Color and pulsed wave Doppler, appears to be occlusive DVT in the left iliac vein. IVC appears patent.   Antibiotics: None  Subjective/Interval History: Difficult to communicate with patient due to her aphasia. No new issues per daughter, who is at the bedside.   ROS: Difficult to do due to her aphasia  Objective:  Vital Signs  Vitals:   05/20/16 0622 05/20/16 1351 05/20/16 2133 05/21/16 0635  BP: (!) 93/51 (!) 101/56 109/83 (!) 94/55  Pulse: 87 85 84 76  Resp: 18 17 18 18   Temp: 97.4 F (36.3 C) 97.8 F (36.6 C) 98.9 F (37.2 C) 98.3 F (36.8 C)  TempSrc: Oral Oral Oral Oral  SpO2: 94% 94% 96% 90%  Weight:      Height:        Intake/Output Summary (Last 24 hours) at 05/21/16 1004 Last data filed at 05/20/16 1800  Gross per 24 hour  Intake              360  ml  Output              200 ml  Net              160 ml   Filed Weights   05/19/16 0000 05/19/16 0131  Weight: 74.8 kg (164 lb 14.5 oz) 70 kg (154 lb 4.8 oz)    General appearance: Awake and alert. No distress. Resp: Clear to auscultation bilaterally Cardio: S1, S2 is normal, regular.  GI: Abdomen is soft. Once again firm mass noted in the lower abdomen in the abdominal wall. Nontender. Perhaps less prominent compared to yesterday.  Extremities: Left leg continues to be swollen Neurologic: Right hemiparesis. Aphasic.  Lab Results:  Data Reviewed: I have personally reviewed following labs and imaging studies  CBC:  Recent Labs Lab 05/18/16 2115 05/19/16 0750 05/19/16 2234 05/20/16 0259 05/21/16 0253  WBC 6.3 7.5 7.0 6.7 6.0  NEUTROABS 4.2  --   --   --   --   HGB 12.3 12.4 11.6* 11.6* 11.4*  HCT 38.7 39.2 36.8 37.5 36.2  MCV 110.6* 110.4* 109.9* 109.0* 110.0*  PLT 510* 505* 459* 497* 480*    Basic Metabolic Panel:  Recent Labs Lab 05/18/16 2115 05/19/16 0234 05/20/16 0259  NA 140 143 140  K 4.3 4.0 4.0  CL 110 110 108  CO2 22 26 26   GLUCOSE 95 98 103*  BUN 14 11 11   CREATININE 0.43* 0.42* 0.34*  CALCIUM 9.0 9.1  8.9    GFR: Estimated Creatinine Clearance: 48.1 mL/min (A) (by C-G formula based on SCr of 0.34 mg/dL (L)).  Coagulation Profile:  Recent Labs Lab 05/18/16 2115 05/21/16 0253  INR 1.28 1.31     Radiology Studies: No results found.   Medications:  Scheduled: . acidophilus  1 capsule Oral Daily  . cephALEXin  500 mg Oral Q8H  . diclofenac sodium  2 g Topical TID  . gabapentin  100 mg Oral QHS  . hydroxyurea  500 mg Oral Q breakfast  . methimazole  5 mg Oral Once per day on Sun Thu  . pantoprazole sodium  40 mg Oral Daily  . polyethylene glycol  17 g Oral Daily  . propranolol  10 mg Oral BID  . sodium chloride flush  3 mL Intravenous Q12H  . traZODone  50 mg Oral QHS  . Warfarin - Pharmacist Dosing Inpatient   Does not apply  q1800   Continuous: . heparin 1,000 Units/hr (05/20/16 1355)   ONG:[id number] **OR** acetaminophen, HYDROcodone-acetaminophen, ipratropium-albuterol, traMADol  Assessment/Plan:  Principal Problem:   DVT (deep venous thrombosis) (HCC) Active Problems:   Hyperthyroidism   Essential hypertension   Multinodular goiter   Stroke, embolic (HCC)   Aphasia S/P CVA   Left leg swelling     Acute DVT involving left lower extremity This seemed to have occurred while the patient was on Eliquis at home. Patient's daughter mentions that patient has been compliant. Patient is nonambulatory due to history of stroke. She has a history of thrombocytosis. Management of DVT discussed briefly with Dr. Bridgett Larsson with vascular surgery. No role for thrombolytics. Abdominal wall hematoma discussed with Dr. Ninfa Linden with general surgery. No role for any surgical intervention. Okay to initiate anticoagulation. Patient was started on IV heparin. Anticoagulation discussed in detail with patient's daughter. She was wary of going back on warfarin. Discussed with Dr. Alvy Bimler with hematology who follows the patient in her office. She also agrees that warfarin is the only way to go at this point in time. Discussed again with daughter and she was agreeable. Patient started on warfarin. Await therapeutic INR. No bleeding issues so far. Patient has an appointment with Dr. Alvy Bimler on June 1. Patient was on warfarin up until January of this year when it was changed over to Eliquis due to vaginal spotting. Patient never saw GYN. If she develops vaginal bleeding and spotting she will need to see a GYN.  Abdominal wall hematoma Detected on CT scan. Occurred during previous hospitalization. She was getting Lovenox injections during that stay. Discussed with general surgery (Dr. Ninfa Linden). No role for acute intervention. May need to be seen by general surgery in a month in their office for this to be drained. Hemoglobin stable.  Stable.  Urinary tract infection UA is noted to be abnormal. Previous culture data reviewed. Continue Keflex for a total of 5 days.  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 Asymptomatic for the most part. Not a surgical candidate. Seen by ENT during previous hospitalization. Continue home medications.  History of stroke with right-sided hemiparesis and aphasia. Stable. Continue home medications.  History of thrombocytosis. Continue hydroxyurea. Followed by Dr. Alvy Bimler. Follow-up appointment on June 1.  Fecal impaction. Enema was given. Laxatives daily.  L2 vertebral fracture. Stable. Appears to be asymptomatic.  Abnormal appearance of the skin over right breast. Incidentally detected on CT scan. Previous mammogram report reviewed. This was done in April 2017.  Progressive skin thickening was noted at that time as well. Biopsy was recommended. Patient did undergo Punch biopsy in May 2017, which showed inflammatory cells. No malignant cells reported.  Chronic dysphagia. Aspiration precautions. Dysphagia diet.  DVT Prophylaxis: IV heparin.    Code Status: Partial code  Family Communication: Discussed with patient's daughter  Disposition Plan: Management as outlined above. Await therapeutic INR.    LOS: 2 days   [facility] Pager [phone] 05/21/2016, 10:04 AM  If 7PM-7AM, please contact night-coverage at [email or web address], password [facility]

## 2016-05-22 ENCOUNTER — Ambulatory Visit: Payer: Medicare Other | Admitting: Physical Medicine & Rehabilitation

## 2016-05-22 LAB — CBC
HCT: 37.2 % (ref 36.0–46.0)
Hemoglobin: 11.5 g/dL — ABNORMAL LOW (ref 12.0–15.0)
MCH: 33.7 pg (ref 26.0–34.0)
MCHC: 30.9 g/dL (ref 30.0–36.0)
MCV: 109.1 fL — ABNORMAL HIGH (ref 78.0–100.0)
Platelets: 501 10*3/uL — ABNORMAL HIGH (ref 150–400)
RBC: 3.41 MIL/uL — ABNORMAL LOW (ref 3.87–5.11)
RDW: 14.8 % (ref 11.5–15.5)
WBC: 6.8 10*3/uL (ref 4.0–10.5)

## 2016-05-22 LAB — BASIC METABOLIC PANEL
Anion gap: 7 (ref 5–15)
BUN: 8 mg/dL (ref 6–20)
CO2: 26 mmol/L (ref 22–32)
Calcium: 8.9 mg/dL (ref 8.9–10.3)
Chloride: 108 mmol/L (ref 101–111)
Creatinine, Ser: 0.33 mg/dL — ABNORMAL LOW (ref 0.44–1.00)
GFR calc Af Amer: >60 mL/min (ref >60)
GFR calc non Af Amer: >60 mL/min (ref >60)
Glucose, Bld: 88 mg/dL (ref 65–99)
Potassium: 3.8 mmol/L (ref 3.5–5.1)
Sodium: 141 mmol/L (ref 135–145)

## 2016-05-22 LAB — PROTIME-INR
INR: 1.37
Prothrombin Time: 17 seconds — ABNORMAL HIGH (ref 11.4–15.2)

## 2016-05-22 LAB — HEPARIN LEVEL (UNFRACTIONATED): Heparin Unfractionated: 0.48 IU/mL (ref 0.30–0.70)

## 2016-05-22 MED ORDER — WARFARIN SODIUM 3 MG PO TABS
6.0000 mg | ORAL_TABLET | Freq: Once | ORAL | Status: AC
  Administered 2016-05-22: 6 mg via ORAL
  Filled 2016-05-22: qty 2

## 2016-05-22 NOTE — Progress Notes (Signed)
[facility] PROGRESS NOTE  Sandy Barrett [id number]:[id number] DOB: 10-13-1932 DOA: 05/18/2016  PCP: [name], MD  Brief History/Interval Summary: 83 year old African-American female with a past medical history of a stroke in March 2016, resulting in right-sided hemiparesis and expressive aphasia, history of prior DVT and PE for which we. She was anticoagulated with an request, history of goiter, not a surgical candidate, history of hypertension and recent hospitalization for aspiration pneumonia. Patient lives with her daughter. She is nonambulatory. She sought attention due to worsening swelling in her left leg. Family also noted swelling in her abdomen.  Reason for Visit: Acute left leg DVT. Abdominal wall hematoma.  Consultants: Phone conversation with general surgery and vascular surgery and hematology  Procedures:   Lower extremity venous Doppler Occlusive DVT noted in the Left common femoral, femoral, and profunda femoral veins. Partial DVT noted in the Left popliteal vein. Difficult to visualize calf veins due to patient position, but no obvious DVT. No DVT RLE. IVC/left iliac duplex: Based on Color and pulsed wave Doppler, appears to be occlusive DVT in the left iliac vein. IVC appears patent.   Antibiotics: None  Subjective/Interval History: Difficult to communicate with patient due to her aphasia. No new issues per daughter. No bleeding has been noted.  ROS: Difficult to do due to her aphasia  Objective:  Vital Signs  Vitals:   05/21/16 1700 05/21/16 2158 05/22/16 0006 05/22/16 0453  BP: 126/65 111/63 114/64 (!) 102/58  Pulse: 92 95 83 81  Resp: 20 18  18   Temp: 97.9 F (36.6 C) 98.2 F (36.8 C)  98.7 F (37.1 C)  TempSrc: Oral Oral  Oral  SpO2: 92% 99%  92%  Weight:      Height:        Intake/Output Summary (Last 24 hours) at 05/22/16 1034 Last data filed at 05/22/16 0830  Gross per 24 hour  Intake              230 ml  Output                0  ml  Net              230 ml   Filed Weights   05/19/16 0000 05/19/16 0131  Weight: 74.8 kg (164 lb 14.5 oz) 70 kg (154 lb 4.8 oz)    General appearance: Somnolent this morning but easily arousable.[name] Resp: Clear to auscultation bilaterally Cardio: S1, S2 is normal, regular. No murmurs GI: Abdomen is soft. Skin once again mass identified in the lower abdomen. Known to be hematoma. Abdomen is nontender Extremities: Swollen left leg as before Neurologic: Right hemiparesis. Aphasic.  Lab Results:  Data Reviewed: I have personally reviewed following labs and imaging studies  CBC:  Recent Labs Lab 05/18/16 2115 05/19/16 0750 05/19/16 2234 05/20/16 0259 05/21/16 0253 05/22/16 0409  WBC 6.3 7.5 7.0 6.7 6.0 6.8  NEUTROABS 4.2  --   --   --   --   --   HGB 12.3 12.4 11.6* 11.6* 11.4* 11.5*  HCT 38.7 39.2 36.8 37.5 36.2 37.2  MCV 110.6* 110.4* 109.9* 109.0* 110.0* 109.1*  PLT 510* 505* 459* 497* 480* 501*    Basic Metabolic Panel:  Recent Labs Lab 05/18/16 2115 05/19/16 0234 05/20/16 0259 05/22/16 0409  NA 140 143 140 141  K 4.3 4.0 4.0 3.8  CL 110 110 108 108  CO2 22 26 26 26   GLUCOSE 95 98 103* 88  BUN 14 11 11  8  CREATININE 0.43* 0.42* 0.34* 0.33*  CALCIUM 9.0 9.1 8.9 8.9    GFR: Estimated Creatinine Clearance: 48.1 mL/min (A) (by C-G formula based on SCr of 0.33 mg/dL (L)).  Coagulation Profile:  Recent Labs Lab 05/18/16 2115 05/21/16 0253 05/22/16 0409  INR 1.28 1.31 1.37     Radiology Studies: No results found.   Medications:  Scheduled: . acidophilus  1 capsule Oral Daily  . cephALEXin  500 mg Oral Q8H  . diclofenac sodium  2 g Topical TID  . gabapentin  100 mg Oral QHS  . hydroxyurea  500 mg Oral Q breakfast  . methimazole  5 mg Oral Once per day on Sun Thu  . pantoprazole sodium  40 mg Oral Daily  . polyethylene glycol  17 g Oral Daily  . propranolol  10 mg Oral BID  . sodium chloride flush  3 mL Intravenous Q12H  . traZODone  50 mg  Oral QHS  . warfarin  6 mg Oral ONCE-1800  . Warfarin - Pharmacist Dosing Inpatient   Does not apply q1800   Continuous: . heparin 1,000 Units/hr (05/21/16 1621)   ZOX:[id number] **OR** acetaminophen, HYDROcodone-acetaminophen, ipratropium-albuterol, traMADol  Assessment/Plan:  Principal Problem:   DVT (deep venous thrombosis) (HCC) Active Problems:   Hyperthyroidism   Essential hypertension   Multinodular goiter   Stroke, embolic (HCC)   Aphasia S/P CVA   Left leg swelling     Acute DVT involving left lower extremity This seemed to have occurred while the patient was on Eliquis at home. Patient's daughter mentions that patient has been compliant. Patient is nonambulatory due to history of stroke. She has a history of thrombocytosis. Management of DVT discussed briefly with Dr. Bridgett Larsson with vascular surgery. No role for thrombolytics. Abdominal wall hematoma discussed with Dr. Ninfa Linden with general surgery. No role for any surgical intervention. Okay to initiate anticoagulation. Patient was started on IV heparin. Anticoagulation discussed in detail with patient's daughter. She was wary of going back on warfarin. Discussed with Dr. Alvy Bimler with hematology who follows the patient in her office. She also agrees that warfarin is the only way to go at this point in time. Discussed again with daughter and she was agreeable. Patient started on warfarin. Patient has an appointment with Dr. Alvy Bimler on June 1. Patient was on warfarin up until January of this year when it was changed over to Eliquis due to vaginal spotting. Patient never saw GYN. If she develops vaginal bleeding and spotting she will need to see a GYN. She remains stable. Await therapeutic INR. Pharmacy is dosing warfarin. No bleeding issues.  Abdominal wall hematoma Detected on CT scan. Occurred during previous hospitalization. She was getting Lovenox injections during that stay. Discussed with general surgery (Dr. Ninfa Linden). No  role for acute intervention. May need to be seen by general surgery in a month in their office for this to be drained. Hemoglobin stable. Stable.  Urinary tract infection UA is noted to be abnormal. Previous culture data reviewed. Continue Keflex for a total of 5 days.  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 Asymptomatic for the most part. Not a surgical candidate. Seen by ENT during previous hospitalization. Continue home medications.  History of stroke with right-sided hemiparesis and aphasia. Stable. Continue home medications.  History of thrombocytosis. Continue hydroxyurea. Followed by Dr. Alvy Bimler. Follow-up appointment on June 1.  Fecal impaction. Enema was given. Laxatives daily.  L2 vertebral fracture. Stable.  Appears to be asymptomatic.  Abnormal appearance of the skin over right breast. Incidentally detected on CT scan. Previous mammogram report reviewed. This was done in April 2017. Progressive skin thickening was noted at that time as well. Biopsy was recommended. Patient did undergo Punch biopsy in May 2017, which showed inflammatory cells. No malignant cells reported.  Chronic dysphagia. Aspiration precautions. Dysphagia diet.  DVT Prophylaxis: IV heparin.    Code Status: Partial code  Family Communication: Discussed with patient's daughter  Disposition Plan: Management as outlined above. Await therapeutic INR.    LOS: 3 days   [facility] Pager [phone] 05/22/2016, 10:34 AM  If 7PM-7AM, please contact night-coverage at [email or web address], password [email or web address]

## 2016-05-22 NOTE — Progress Notes (Signed)
[place] for heparin, coumadin Indication: DVT  No Known Allergies  Patient Measurements: Height: 5' (152.4 cm) Weight: 154 lb 4.8 oz (70 kg) IBW/kg (Calculated) : 45.5 Heparin Dosing Weight: 60kg  Vital Signs: Temp: 98.7 F (37.1 C) (05/18 0453) Temp Source: Oral (05/18 0453) BP: 102/58 (05/18 0453) Pulse Rate: 81 (05/18 0453)  Labs:  Recent Labs  05/19/16 2238 05/20/16 0259 05/21/16 0253 05/22/16 0409  HGB  --  11.6* 11.4* 11.5*  HCT  --  37.5 36.2 37.2  PLT  --  497* 480* 501*  APTT 78*  --   --   --   LABPROT  --   --  16.3* 17.0*  INR  --   --  1.31 1.37  HEPARINUNFRC 0.57 0.52 0.57 0.48  CREATININE  --  0.34*  --  0.33*    Estimated Creatinine Clearance: 48.1 mL/min (A) (by C-G formula based on SCr of 0.33 mg/dL (L)).   Scheduled:  . acidophilus  1 capsule Oral Daily  . cephALEXin  500 mg Oral Q8H  . diclofenac sodium  2 g Topical TID  . gabapentin  100 mg Oral QHS  . hydroxyurea  500 mg Oral Q breakfast  . methimazole  5 mg Oral Once per day on Sun Thu  . pantoprazole sodium  40 mg Oral Daily  . polyethylene glycol  17 g Oral Daily  . propranolol  10 mg Oral BID  . sodium chloride flush  3 mL Intravenous Q12H  . traZODone  50 mg Oral QHS  . Warfarin - Pharmacist Dosing Inpatient   Does not apply q1800    Assessment: 83 yo female on heparin bridge to coumadin for LLE DVT (also with history of DVT/PE on apixiban PTA) and noted with abdominal wall hematoma, no surgical intervention needed for now.  -Heparin level= 0.48 on 1000 units/hr, INR= 1.37   Goal of Therapy:  Heparin level 0.3-0.7 units/ml Monitor platelets by anticoagulation protocol: Yes   Plan:  -No heparin changes needed -Heparin level daily with CBC daily -Coumadin 6 mg po today -Daily PT/INR   Maryanna Shape, PharmD, BCPS  Clinical Pharmacist  Pager: [phone]   05/22/2016 8:20 AM

## 2016-05-23 DIAGNOSIS — D539 Nutritional anemia, unspecified: Secondary | ICD-10-CM

## 2016-05-23 LAB — HEPARIN LEVEL (UNFRACTIONATED): Heparin Unfractionated: 0.38 IU/mL (ref 0.30–0.70)

## 2016-05-23 LAB — CBC
HCT: 36 % (ref 36.0–46.0)
Hemoglobin: 11.4 g/dL — ABNORMAL LOW (ref 12.0–15.0)
MCH: 34.4 pg — ABNORMAL HIGH (ref 26.0–34.0)
MCHC: 31.7 g/dL (ref 30.0–36.0)
MCV: 108.8 fL — ABNORMAL HIGH (ref 78.0–100.0)
Platelets: 515 10*3/uL — ABNORMAL HIGH (ref 150–400)
RBC: 3.31 MIL/uL — ABNORMAL LOW (ref 3.87–5.11)
RDW: 14.9 % (ref 11.5–15.5)
WBC: 7.1 10*3/uL (ref 4.0–10.5)

## 2016-05-23 LAB — PROTIME-INR
INR: 1.56
Prothrombin Time: 18.9 seconds — ABNORMAL HIGH (ref 11.4–15.2)

## 2016-05-23 MED ORDER — WARFARIN SODIUM 3 MG PO TABS
6.0000 mg | ORAL_TABLET | Freq: Once | ORAL | Status: AC
Start: 2016-05-23 — End: 2016-05-23
  Administered 2016-05-23: 6 mg via ORAL
  Filled 2016-05-23: qty 2

## 2016-05-23 NOTE — Progress Notes (Signed)
[facility] PROGRESS NOTE  Sandy Barrett TGY:[id number] DOB: May 09, 1932 DOA: 05/18/2016  PCP: [name], MD  Brief History/Interval Summary: 83 year old African-American female with a past medical history of a stroke in March 2016, resulting in right-sided hemiparesis and expressive aphasia, history of prior DVT and PE for which we. She was anticoagulated with an request, history of goiter, not a surgical candidate, history of hypertension and recent hospitalization for aspiration pneumonia. Patient lives with her daughter. She is nonambulatory. She sought attention due to worsening swelling in her left leg. Family also noted swelling in her abdomen.  Reason for Visit: Acute left leg DVT. Abdominal wall hematoma.  Consultants: Phone conversation with general surgery and vascular surgery and hematology  Procedures:   Lower extremity venous Doppler Occlusive DVT noted in the Left common femoral, femoral, and profunda femoral veins. Partial DVT noted in the Left popliteal vein. Difficult to visualize calf veins due to patient position, but no obvious DVT. No DVT RLE. IVC/left iliac duplex: Based on Color and pulsed wave Doppler, appears to be occlusive DVT in the left iliac vein. IVC appears patent.   Antibiotics: None  Subjective/Interval History: No new issues per daughter. No bleeding.   ROS: Difficult to do due to her aphasia  Objective:  Vital Signs  Vitals:   05/22/16 1508 05/22/16 2042 05/23/16 0515 05/23/16 1300  BP: (!) 83/54 117/63 (!) 96/53 (!) 111/55  Pulse: 88 88 80 84  Resp: 18 18 18 17   Temp: 98.4 F (36.9 C) 98.7 F (37.1 C) 98.3 F (36.8 C) 98 F (36.7 C)  TempSrc: Oral Oral Oral Oral  SpO2: 97% 100% 95% 94%  Weight:      Height:        Intake/Output Summary (Last 24 hours) at 05/23/16 1459 Last data filed at 05/23/16 0900  Gross per 24 hour  Intake              230 ml  Output                0 ml  Net              230 ml   Filed  Weights   05/19/16 0000 05/19/16 0131  Weight: 74.8 kg (164 lb 14.5 oz) 70 kg (154 lb 4.8 oz)    General appearance: Awake, alert. No distress Resp: Clear to auscultation bilaterally Cardio: S1, S2 is normal, regular. No murmurs GI: Abdomen is soft. Firm massin the lower abdomen which is known to be hematoma is stable.  Extremities: Swollen left leg as before Neurologic: Right hemiparesis. Aphasic.  Lab Results:  Data Reviewed: I have personally reviewed following labs and imaging studies  CBC:  Recent Labs Lab 05/18/16 2115  05/19/16 2234 05/20/16 0259 05/21/16 0253 05/22/16 0409 05/23/16 0225  WBC 6.3  < > 7.0 6.7 6.0 6.8 7.1  NEUTROABS 4.2  --   --   --   --   --   --   HGB 12.3  < > 11.6* 11.6* 11.4* 11.5* 11.4*  HCT 38.7  < > 36.8 37.5 36.2 37.2 36.0  MCV 110.6*  < > 109.9* 109.0* 110.0* 109.1* 108.8*  PLT 510*  < > 459* 497* 480* 501* 515*  < > = values in this interval not displayed.  Basic Metabolic Panel:  Recent Labs Lab 05/18/16 2115 05/19/16 0234 05/20/16 0259 05/22/16 0409  NA 140 143 140 141  K 4.3 4.0 4.0 3.8  CL 110 110 108 108  CO2 22 26 26 26   GLUCOSE 95 98 103* 88  BUN 14 11 11 8   CREATININE 0.43* 0.42* 0.34* 0.33*  CALCIUM 9.0 9.1 8.9 8.9    GFR: Estimated Creatinine Clearance: 48.1 mL/min (A) (by C-G formula based on SCr of 0.33 mg/dL (L)).  Coagulation Profile:  Recent Labs Lab 05/18/16 2115 05/21/16 0253 05/22/16 0409 05/23/16 0225  INR 1.28 1.31 1.37 1.56     Radiology Studies: No results found.   Medications:  Scheduled: . acidophilus  1 capsule Oral Daily  . cephALEXin  500 mg Oral Q8H  . diclofenac sodium  2 g Topical TID  . gabapentin  100 mg Oral QHS  . hydroxyurea  500 mg Oral Q breakfast  . methimazole  5 mg Oral Once per day on Sun Thu  . pantoprazole sodium  40 mg Oral Daily  . polyethylene glycol  17 g Oral Daily  . propranolol  10 mg Oral BID  . sodium chloride flush  3 mL Intravenous Q12H  .  traZODone  50 mg Oral QHS  . warfarin  6 mg Oral ONCE-1800  . Warfarin - Pharmacist Dosing Inpatient   Does not apply q1800   Continuous: . heparin 1,000 Units/hr (05/22/16 1628)   RKY:[id number] **OR** acetaminophen, HYDROcodone-acetaminophen, ipratropium-albuterol, traMADol  Assessment/Plan:  Principal Problem:   DVT (deep venous thrombosis) (HCC) Active Problems:   Hyperthyroidism   Essential hypertension   Multinodular goiter   Stroke, embolic (HCC)   Aphasia S/P CVA   Left leg swelling     Acute DVT involving left lower extremity This seemed to have occurred while the patient was on Eliquis at home. Patient's daughter mentions that patient has been compliant. Patient is nonambulatory due to history of stroke. She has a history of thrombocytosis. Management of DVT discussed briefly with Dr. Bridgett Larsson with vascular surgery. No role for thrombolytics. Abdominal wall hematoma discussed with Dr. Ninfa Linden with general surgery. No role for any surgical intervention. Okay to initiate anticoagulation. Patient was started on IV heparin. Anticoagulation discussed in detail with patient's daughter. She was wary of going back on warfarin. Discussed with Dr. Alvy Bimler with hematology who follows the patient in her office. She also agrees that warfarin is the only way to go at this point in time. Discussed again with daughter and she was agreeable. Patient started on warfarin. Patient has an appointment with Dr. Alvy Bimler on June 1. Patient was on warfarin up until January of this year when it was changed over to Eliquis due to vaginal spotting. Patient never saw GYN. If she develops vaginal bleeding and spotting she will need to see a GYN. She remains stable. Await therapeutic INR. Pharmacy is dosing warfarin. No bleeding issues. No new issues.  Abdominal wall hematoma Detected on CT scan. Occurred during previous hospitalization. She was getting Lovenox injections during that stay. Discussed with  general surgery (Dr. Ninfa Linden). No role for acute intervention. May need to be seen by general surgery in a month in their office for this to be drained. Hemoglobin stable. Stable.  Macrocytic anemia Hemoglobin is stable. TSH was checked in March and was normal. No recent V-61 or folic acid levels in the system. We will order.  Urinary tract infection UA is noted to be abnormal. Previous culture data reviewed. Continue Keflex for a total of 5 days.  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 Asymptomatic for the most part. Not a surgical candidate. Seen  by ENT during previous hospitalization. Continue home medications.  History of stroke with right-sided hemiparesis and aphasia. Stable. Continue home medications.  History of thrombocytosis. Continue hydroxyurea. Followed by Dr. Alvy Bimler. Follow-up appointment on June 1.  Fecal impaction. She was given enema. Continue laxatives.  L2 vertebral fracture. Stable. Appears to be asymptomatic.  Abnormal appearance of the skin over right breast. Incidentally detected on CT scan. Previous mammogram report reviewed. This was done in April 2017. Progressive skin thickening was noted at that time as well. Biopsy was recommended. Patient did undergo Punch biopsy in May 2017, which showed inflammatory cells. No malignant cells reported.  Chronic dysphagia. Aspiration precautions. Dysphagia diet.  DVT Prophylaxis: IV heparin.    Code Status: Partial code  Family Communication: Discussed with patient's daughter  Disposition Plan: Management as outlined above. Await therapeutic INR.    LOS: 4 days   [place] [facility] Pager [phone] 05/23/2016, 2:59 PM  If 7PM-7AM, please contact night-coverage at [email or web address], password [facility]

## 2016-05-23 NOTE — Progress Notes (Signed)
[place] for heparin, coumadin Indication: DVT  No Known Allergies  Patient Measurements: Height: 5' (152.4 cm) Weight: 154 lb 4.8 oz (70 kg) IBW/kg (Calculated) : 45.5 Heparin Dosing Weight: 60kg  Vital Signs: Temp: 98.3 F (36.8 C) (05/19 0515) Temp Source: Oral (05/19 0515) BP: 96/53 (05/19 0515) Pulse Rate: 80 (05/19 0515)  Labs:  Recent Labs  05/21/16 0253 05/22/16 0409 05/23/16 0225  HGB 11.4* 11.5* 11.4*  HCT 36.2 37.2 36.0  PLT 480* 501* 515*  LABPROT 16.3* 17.0* 18.9*  INR 1.31 1.37 1.56  HEPARINUNFRC 0.57 0.48 0.38  CREATININE  --  0.33*  --     Estimated Creatinine Clearance: 48.1 mL/min (A) (by C-G formula based on SCr of 0.33 mg/dL (L)).   Scheduled:  . acidophilus  1 capsule Oral Daily  . cephALEXin  500 mg Oral Q8H  . diclofenac sodium  2 g Topical TID  . gabapentin  100 mg Oral QHS  . hydroxyurea  500 mg Oral Q breakfast  . methimazole  5 mg Oral Once per day on Sun Thu  . pantoprazole sodium  40 mg Oral Daily  . polyethylene glycol  17 g Oral Daily  . propranolol  10 mg Oral BID  . sodium chloride flush  3 mL Intravenous Q12H  . traZODone  50 mg Oral QHS  . Warfarin - Pharmacist Dosing Inpatient   Does not apply q1800    Assessment: 83 yo female on heparin bridge to coumadin for LLE DVT (also with history of DVT/PE on apixiban PTA) and noted with abdominal wall hematoma, no surgical intervention needed for now.  -Heparin level= 0.38 on 1000 units/hr, INR= 1.56   Goal of Therapy:  Heparin level 0.3-0.7 units/ml Monitor platelets by anticoagulation protocol: Yes   Plan:  -No heparin changes needed -Heparin level daily with CBC daily -Coumadin 6 mg po today -Daily PT/INR  Hildred Laser, Pharm D 05/23/2016 10:42 AM

## 2016-05-24 LAB — RETICULOCYTES
RBC.: 3.35 MIL/uL — ABNORMAL LOW (ref 3.87–5.11)
Retic Count, Absolute: 100.5 10*3/uL (ref 19.0–186.0)
Retic Ct Pct: 3 % (ref 0.4–3.1)

## 2016-05-24 LAB — CBC
HCT: 36.3 % (ref 36.0–46.0)
Hemoglobin: 11.4 g/dL — ABNORMAL LOW (ref 12.0–15.0)
MCH: 34 pg (ref 26.0–34.0)
MCHC: 31.4 g/dL (ref 30.0–36.0)
MCV: 108.4 fL — ABNORMAL HIGH (ref 78.0–100.0)
Platelets: 476 10*3/uL — ABNORMAL HIGH (ref 150–400)
RBC: 3.35 MIL/uL — ABNORMAL LOW (ref 3.87–5.11)
RDW: 14.8 % (ref 11.5–15.5)
WBC: 7.5 10*3/uL (ref 4.0–10.5)

## 2016-05-24 LAB — PROTIME-INR
INR: 2.06
Prothrombin Time: 23.5 seconds — ABNORMAL HIGH (ref 11.4–15.2)

## 2016-05-24 LAB — IRON AND TIBC
Iron: 30 ug/dL (ref 28–170)
Saturation Ratios: 15 % (ref 10.4–31.8)
TIBC: 200 ug/dL — ABNORMAL LOW (ref 250–450)
UIBC: 170 ug/dL

## 2016-05-24 LAB — FERRITIN: Ferritin: 184 ng/mL (ref 11–307)

## 2016-05-24 LAB — VITAMIN B12: Vitamin B-12: 379 pg/mL (ref 180–914)

## 2016-05-24 LAB — FOLATE: Folate: 7.6 ng/mL (ref >5.9)

## 2016-05-24 LAB — HEPARIN LEVEL (UNFRACTIONATED): Heparin Unfractionated: 0.44 IU/mL (ref 0.30–0.70)

## 2016-05-24 MED ORDER — PROPRANOLOL HCL 10 MG PO TABS
5.0000 mg | ORAL_TABLET | Freq: Two times a day (BID) | ORAL | Status: DC
  Administered 2016-05-25 – 2016-05-26 (×2): 5 mg via ORAL
  Filled 2016-05-24 (×3): qty 0.5

## 2016-05-24 MED ORDER — NYSTATIN 100000 UNIT/GM EX POWD
Freq: Two times a day (BID) | CUTANEOUS | Status: DC
  Administered 2016-05-24 – 2016-05-26 (×4): via TOPICAL
  Filled 2016-05-24: qty 15

## 2016-05-24 MED ORDER — WARFARIN SODIUM 3 MG PO TABS
3.0000 mg | ORAL_TABLET | Freq: Once | ORAL | Status: AC
  Administered 2016-05-24: 3 mg via ORAL
  Filled 2016-05-24: qty 1

## 2016-05-24 NOTE — Progress Notes (Signed)
[facility] PROGRESS NOTE  Sandy Barrett OFB:[id number] DOB: 12-17-1932 DOA: 05/18/2016  PCP: [name], MD  Brief History/Interval Summary: 83 year old African-American female with a past medical history of a stroke in March 2016, resulting in right-sided hemiparesis and expressive aphasia, history of prior DVT and PE for which we. She was anticoagulated with an request, history of goiter, not a surgical candidate, history of hypertension and recent hospitalization for aspiration pneumonia. Patient lives with her daughter. She is nonambulatory. She sought attention due to worsening swelling in her left leg. Family also noted swelling in her abdomen.  Reason for Visit: Acute left leg DVT. Abdominal wall hematoma.  Consultants: Phone conversation with general surgery and vascular surgery and hematology  Procedures:   Lower extremity venous Doppler Occlusive DVT noted in the Left common femoral, femoral, and profunda femoral veins. Partial DVT noted in the Left popliteal vein. Difficult to visualize calf veins due to patient position, but no obvious DVT. No DVT RLE. IVC/left iliac duplex: Based on Color and pulsed wave Doppler, appears to be occlusive DVT in the left iliac vein. IVC appears patent.  Antibiotics: None  Subjective/Interval History: Denies any bleeding. Daughter is at the bedside. Mentioned some rash in her armpit.  ROS: Difficult to do due to her aphasia  Objective:  Vital Signs  Vitals:   05/23/16 0515 05/23/16 1300 05/23/16 2045 05/24/16 0624  BP: (!) 96/53 (!) 111/55 (!) 95/55 110/63  Pulse: 80 84 85 81  Resp: 18 17  18   Temp: 98.3 F (36.8 C) 98 F (36.7 C) 98 F (36.7 C) 98 F (36.7 C)  TempSrc: Oral Oral Oral Oral  SpO2: 95% 94% 99% 93%  Weight:      Height:        Intake/Output Summary (Last 24 hours) at 05/24/16 1145 Last data filed at 05/23/16 1800  Gross per 24 hour  Intake              240 ml  Output                0 ml  Net               240 ml   Filed Weights   05/19/16 0000 05/19/16 0131  Weight: 74.8 kg (164 lb 14.5 oz) 70 kg (154 lb 4.8 oz)    General appearance: Awake, alert. In no distress. Resp: Clear to auscultation bilaterally. No wheezing, rales or rhonchi. Cardio: S1, S2 is normal, regular. No S3, S4. No murmurs or bruits GI: Abdomen is soft. Firm massin the lower abdomen which is known to be hematoma is stable.  Extremities: Moist macerated skin identified in the left armpit. Slightly erythematous. Neurologic: Right hemiparesis. Aphasic.  Lab Results:  Data Reviewed: I have personally reviewed following labs and imaging studies  CBC:  Recent Labs Lab 05/18/16 2115  05/20/16 0259 05/21/16 0253 05/22/16 0409 05/23/16 0225 05/24/16 0225  WBC 6.3  < > 6.7 6.0 6.8 7.1 7.5  NEUTROABS 4.2  --   --   --   --   --   --   HGB 12.3  < > 11.6* 11.4* 11.5* 11.4* 11.4*  HCT 38.7  < > 37.5 36.2 37.2 36.0 36.3  MCV 110.6*  < > 109.0* 110.0* 109.1* 108.8* 108.4*  PLT 510*  < > 497* 480* 501* 515* 476*  < > = values in this interval not displayed.  Basic Metabolic Panel:  Recent Labs Lab 05/18/16 2115 05/19/16 0234 05/20/16  0259 05/22/16 0409  NA 140 143 140 141  K 4.3 4.0 4.0 3.8  CL 110 110 108 108  CO2 22 26 26 26   GLUCOSE 95 98 103* 88  BUN 14 11 11 8   CREATININE 0.43* 0.42* 0.34* 0.33*  CALCIUM 9.0 9.1 8.9 8.9    GFR: Estimated Creatinine Clearance: 48.1 mL/min (A) (by C-G formula based on SCr of 0.33 mg/dL (L)).  Coagulation Profile:  Recent Labs Lab 05/18/16 2115 05/21/16 0253 05/22/16 0409 05/23/16 0225 05/24/16 0225  INR 1.28 1.31 1.37 1.56 2.06     Radiology Studies: No results found.   Medications:  Scheduled: . acidophilus  1 capsule Oral Daily  . cephALEXin  500 mg Oral Q8H  . diclofenac sodium  2 g Topical TID  . gabapentin  100 mg Oral QHS  . hydroxyurea  500 mg Oral Q breakfast  . methimazole  5 mg Oral Once per day on Sun Thu  . nystatin    Topical BID  . pantoprazole sodium  40 mg Oral Daily  . polyethylene glycol  17 g Oral Daily  . propranolol  10 mg Oral BID  . sodium chloride flush  3 mL Intravenous Q12H  . traZODone  50 mg Oral QHS  . Warfarin - Pharmacist Dosing Inpatient   Does not apply q1800   Continuous: . heparin 1,000 Units/hr (05/23/16 2030)   BOF:BPZWCHENIDPOE **OR** acetaminophen, HYDROcodone-acetaminophen, ipratropium-albuterol, traMADol  Assessment/Plan:  Principal Problem:   DVT (deep venous thrombosis) (HCC) Active Problems:   Hyperthyroidism   Essential hypertension   Multinodular goiter   Stroke, embolic (HCC)   Aphasia S/P CVA   Left leg swelling     Acute DVT involving left lower extremity This seemed to have occurred while the patient was on Eliquis at home. Patient's daughter mentions that patient has been compliant. Patient is nonambulatory due to history of stroke. She has a history of thrombocytosis. Management of DVT discussed briefly with Dr. Bridgett Larsson with vascular surgery. No role for thrombolytics. Abdominal wall hematoma discussed with Dr. Ninfa Linden with general surgery. No role for any surgical intervention. Okay to initiate anticoagulation. Patient was started on IV heparin. Anticoagulation discussed in detail with patient's daughter. She was wary of going back on warfarin. Discussed with Dr. Alvy Bimler with hematology who follows the patient in her office. She also agrees that warfarin is the only way to go at this point in time. Discussed again with daughter and she was agreeable. Patient started on warfarin. Patient has an appointment with Dr. Alvy Bimler on June 1. Patient was on warfarin up until January of this year when it was changed over to Eliquis due to vaginal spotting. Patient never saw GYN. If she develops vaginal bleeding and spotting she will need to see a GYN. She remains stable. INR is therapeutic today. Continue heparin for 24 more hours and repeat INR tomorrow morning. Pharmacy is  dosing warfarin. No bleeding issues. No new issues.  Abdominal wall hematoma Detected on CT scan. Occurred during previous hospitalization. She was getting Lovenox injections during that stay. Discussed with general surgery (Dr. Ninfa Linden). No role for acute intervention. May need to be seen by general surgery in a month in their office for this to be drained. Hemoglobin stable.   Macrocytic anemia Hemoglobin is stable. TSH was checked in March and was normal. U-23 and folic acid levels are normal.   Urinary tract infection UA is noted to be abnormal. Previous culture data reviewed. Continue Keflex for a total of  5 days.  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 Asymptomatic for the most part. Not a surgical candidate. Seen by ENT during previous hospitalization. Continue home medications.  History of stroke with right-sided hemiparesis and aphasia. Stable. Continue home medications.  History of thrombocytosis. Continue hydroxyurea. Followed by Dr. Alvy Bimler. Follow-up appointment on June 1.  Fecal impaction. She was given enema. Continue laxatives.  L2 vertebral fracture. Stable. Appears to be asymptomatic.  Abnormal appearance of the skin over right breast. Incidentally detected on CT scan. Previous mammogram report reviewed. This was done in April 2017. Progressive skin thickening was noted at that time as well. Biopsy was recommended. Patient did undergo Punch biopsy in May 2017, which showed inflammatory cells. No malignant cells reported.  Chronic dysphagia. Aspiration precautions. Dysphagia diet.  DVT Prophylaxis: IV heparin.    Code Status: Partial code  Family Communication: Discussed with patient's daughter  Disposition Plan: Management as outlined above. Antifungal powder for possible yeast infection left armpit. If INR remains therapeutic tomorrow, she can be discharged.    LOS: 5 days     [facility] Pager [phone] 05/24/2016, 11:45 AM  If 7PM-7AM, please contact night-coverage at [email or web address], password [email or web address]

## 2016-05-24 NOTE — Progress Notes (Signed)
[place] for heparin, coumadin Indication: DVT  No Known Allergies  Patient Measurements: Height: 5' (152.4 cm) Weight: 154 lb 4.8 oz (70 kg) IBW/kg (Calculated) : 45.5 Heparin Dosing Weight: 60kg  Vital Signs: Temp: 98 F (36.7 C) (05/20 0624) Temp Source: Oral (05/20 0624) BP: 110/63 (05/20 0624) Pulse Rate: 81 (05/20 0624)  Labs:  Recent Labs  05/22/16 0409 05/23/16 0225 05/24/16 0225  HGB 11.5* 11.4* 11.4*  HCT 37.2 36.0 36.3  PLT 501* 515* 476*  LABPROT 17.0* 18.9* 23.5*  INR 1.37 1.56 2.06  HEPARINUNFRC 0.48 0.38 0.44  CREATININE 0.33*  --   --     Estimated Creatinine Clearance: 48.1 mL/min (A) (by C-G formula based on SCr of 0.33 mg/dL (L)).   Scheduled:  . acidophilus  1 capsule Oral Daily  . cephALEXin  500 mg Oral Q8H  . diclofenac sodium  2 g Topical TID  . gabapentin  100 mg Oral QHS  . hydroxyurea  500 mg Oral Q breakfast  . methimazole  5 mg Oral Once per day on Sun Thu  . pantoprazole sodium  40 mg Oral Daily  . polyethylene glycol  17 g Oral Daily  . propranolol  10 mg Oral BID  . sodium chloride flush  3 mL Intravenous Q12H  . traZODone  50 mg Oral QHS  . Warfarin - Pharmacist Dosing Inpatient   Does not apply q1800    Assessment: 83 yo female on heparin bridge to coumadin for LLE DVT (also with history of DVT/PE on apixiban PTA) and noted with abdominal wall hematoma, no surgical intervention needed for now. Today is day 5 of coumadin/heparin overlap -Heparin level= 0.44 on 1000 units/hr, INR= 2.06 (anticipate continued trend up)   Goal of Therapy:  Heparin level 0.3-0.7 units/ml Monitor platelets by anticoagulation protocol: Yes   Plan:  -No heparin changes needed -Heparin level daily with CBC daily -Coumadin 3 mg po today (consider 3mg  po daily on discharge with close INR follow-up (consider Tuesday?) -Daily PT/INR  Hildred Laser, Pharm D 05/24/2016 11:06 AM

## 2016-05-25 LAB — HEPARIN LEVEL (UNFRACTIONATED): Heparin Unfractionated: 0.34 IU/mL (ref 0.30–0.70)

## 2016-05-25 LAB — CBC
HCT: 36.6 % (ref 36.0–46.0)
Hemoglobin: 11.5 g/dL — ABNORMAL LOW (ref 12.0–15.0)
MCH: 34 pg (ref 26.0–34.0)
MCHC: 31.4 g/dL (ref 30.0–36.0)
MCV: 108.3 fL — ABNORMAL HIGH (ref 78.0–100.0)
Platelets: 509 10*3/uL — ABNORMAL HIGH (ref 150–400)
RBC: 3.38 MIL/uL — ABNORMAL LOW (ref 3.87–5.11)
RDW: 14.9 % (ref 11.5–15.5)
WBC: 6.7 10*3/uL (ref 4.0–10.5)

## 2016-05-25 LAB — PROTIME-INR
INR: 1.9
Prothrombin Time: 22 seconds — ABNORMAL HIGH (ref 11.4–15.2)

## 2016-05-25 MED ORDER — DIPHENHYDRAMINE HCL 25 MG PO CAPS
25.0000 mg | ORAL_CAPSULE | ORAL | Status: DC | PRN
  Administered 2016-05-25: 25 mg via ORAL
  Filled 2016-05-25: qty 1

## 2016-05-25 MED ORDER — WARFARIN SODIUM 5 MG PO TABS
5.0000 mg | ORAL_TABLET | Freq: Once | ORAL | Status: AC
  Administered 2016-05-25: 5 mg via ORAL
  Filled 2016-05-25: qty 1

## 2016-05-25 MED ORDER — GUAIFENESIN ER 600 MG PO TB12
600.0000 mg | ORAL_TABLET | Freq: Two times a day (BID) | ORAL | Status: DC
  Administered 2016-05-25 – 2016-05-26 (×3): 600 mg via ORAL
  Filled 2016-05-25 (×3): qty 1

## 2016-05-25 NOTE — Progress Notes (Signed)
[place] for heparin, coumadin Indication: DVT  No Known Allergies  Patient Measurements: Height: 5' (152.4 cm) Weight: 154 lb 4.8 oz (70 kg) IBW/kg (Calculated) : 45.5 Heparin Dosing Weight: 60kg  Vital Signs: Temp: 97.7 F (36.5 C) (05/21 0643) Temp Source: Oral (05/21 0643) BP: 112/60 (05/21 0643) Pulse Rate: 84 (05/21 0643)  Labs:  Recent Labs  05/23/16 0225 05/24/16 0225 05/25/16 0304  HGB 11.4* 11.4* 11.5*  HCT 36.0 36.3 36.6  PLT 515* 476* 509*  LABPROT 18.9* 23.5*  --   INR 1.56 2.06  --   HEPARINUNFRC 0.38 0.44  --     Estimated Creatinine Clearance: 48.1 mL/min (A) (by C-G formula based on SCr of 0.33 mg/dL (L)).   Scheduled:  . acidophilus  1 capsule Oral Daily  . cephALEXin  500 mg Oral Q8H  . diclofenac sodium  2 g Topical TID  . gabapentin  100 mg Oral QHS  . hydroxyurea  500 mg Oral Q breakfast  . methimazole  5 mg Oral Once per day on Sun Thu  . nystatin   Topical BID  . pantoprazole sodium  40 mg Oral Daily  . polyethylene glycol  17 g Oral Daily  . propranolol  5 mg Oral BID  . sodium chloride flush  3 mL Intravenous Q12H  . traZODone  50 mg Oral QHS  . Warfarin - Pharmacist Dosing Inpatient   Does not apply q1800    Assessment: 83 yo female on heparin bridge to coumadin for LLE DVT (also with history of DVT/PE on apixiban PTA) and noted with abdominal wall hematoma, no surgical intervention needed for now - ok to start anticoagulation per MD. Completed 5 days of coumadin/heparin overlap. CBC stable.  Heparin level remains therapeutic on 1000 units/hr, INR remains close to therapeutic at 1.9 (trend down a bit). No further bleed issues documented.   Goal of Therapy:  Heparin level 0.3-0.7 units/ml Monitor platelets by anticoagulation protocol: Yes   Plan:  -Heparin at 1000 units/hr -Coumadin 5mg  PO x 1 today -Daily heparin level/INR/CBC -Monitor for s/sx bleeding   Elicia Lamp, PharmD,  BCPS Clinical Pharmacist 05/25/2016 8:39 AM

## 2016-05-25 NOTE — Progress Notes (Signed)
Patient's daughter notified this RN of the increase redness/irritation under the left arm pit. Nystatin and interdry was previously ordered. Received order for benadryl. Daughter wanted to hold off on the interdry because she said that, "it started to look more irritated after the interdry was placed". Patient's daughter wanted to use the powder that she uses at home for the arm pits instead of the nystatin/interdry.   Patient's both arm pits were washed with daughter's preferred body wash, dried and the preferred powder was applied. Interdry was removed per daughter's request. Patient's sacral area was washed, dried, and the combination of A&D ointment/neosporin was used on the sacral area and bilateral inner thighs per daughter's request.   Will continue to monitor

## 2016-05-25 NOTE — Care Management Important Message (Signed)
Important Message  Patient Details  Name: Sandy Barrett MRN: [id number] Date of Birth: Sep 06, 1932   Medicare Important Message Given:  Yes    Nathen May 05/25/2016, 12:59 PM

## 2016-05-25 NOTE — Progress Notes (Signed)
[facility] PROGRESS NOTE  Sandy Barrett DJS:[id number] DOB: 1932/08/24 DOA: 05/18/2016  PCP: [name], MD  Brief History/Interval Summary: 83 year old African-American female with a past medical history of a stroke in March 2016, resulting in right-sided hemiparesis and expressive aphasia, history of prior DVT and PE for which we. She was anticoagulated with an request, history of goiter, not a surgical candidate, history of hypertension and recent hospitalization for aspiration pneumonia. Patient lives with her daughter. She is nonambulatory. She sought attention due to worsening swelling in her left leg. Family also noted swelling in her abdomen.  Reason for Visit: Acute left leg DVT. Abdominal wall hematoma.  Consultants: Phone conversation with general surgery and vascular surgery and hematology  Procedures:   Lower extremity venous Doppler Occlusive DVT noted in the Left common femoral, femoral, and profunda femoral veins. Partial DVT noted in the Left popliteal vein. Difficult to visualize calf veins due to patient position, but no obvious DVT. No DVT RLE. IVC/left iliac duplex: Based on Color and pulsed wave Doppler, appears to be occlusive DVT in the left iliac vein. IVC appears patent.  Antibiotics: None  Subjective/Interval History: Patient doing well. Some congestion this morning. Requesting Mucinex. Daughter at the bedside. No bleeding.   ROS: Difficult to do due to her aphasia  Objective:  Vital Signs  Vitals:   05/24/16 0624 05/24/16 1434 05/24/16 2006 05/25/16 0643  BP: 110/63 (!) 96/44 140/85 112/60  Pulse: 81 82 92 84  Resp: 18 17 18 19   Temp: 98 F (36.7 C) 98.1 F (36.7 C) 99 F (37.2 C) 97.7 F (36.5 C)  TempSrc: Oral Oral Oral Oral  SpO2: 93% 96% 95% 92%  Weight:      Height:        Intake/Output Summary (Last 24 hours) at 05/25/16 0956 Last data filed at 05/24/16 1300  Gross per 24 hour  Intake              120 ml  Output                 0 ml  Net              120 ml   Filed Weights   05/19/16 0000 05/19/16 0131  Weight: 74.8 kg (164 lb 14.5 oz) 70 kg (154 lb 4.8 oz)    General appearance: Awake, alert. No distress Resp: Remains clear to auscultation bilaterally Cardio: S1, S2 is normal, regular. No S3, S4. No rubs, murmurs, or bruit GI: Abdomen remains soft. The firm mass, known to be abdominal wall hematoma, in the lower abdomen is unchanged. Slightly tender. Neurologic: Right hemiparesis. Aphasic.  Lab Results:  Data Reviewed: I have personally reviewed following labs and imaging studies  CBC:  Recent Labs Lab 05/18/16 2115  05/21/16 0253 05/22/16 0409 05/23/16 0225 05/24/16 0225 05/25/16 0304  WBC 6.3  < > 6.0 6.8 7.1 7.5 6.7  NEUTROABS 4.2  --   --   --   --   --   --   HGB 12.3  < > 11.4* 11.5* 11.4* 11.4* 11.5*  HCT 38.7  < > 36.2 37.2 36.0 36.3 36.6  MCV 110.6*  < > 110.0* 109.1* 108.8* 108.4* 108.3*  PLT 510*  < > 480* 501* 515* 476* 509*  < > = values in this interval not displayed.  Basic Metabolic Panel:  Recent Labs Lab 05/18/16 2115 05/19/16 0234 05/20/16 0259 05/22/16 0409  NA 140 143 140 141  K 4.3  4.0 4.0 3.8  CL 110 110 108 108  CO2 22 26 26 26   GLUCOSE 95 98 103* 88  BUN 14 11 11 8   CREATININE 0.43* 0.42* 0.34* 0.33*  CALCIUM 9.0 9.1 8.9 8.9    GFR: Estimated Creatinine Clearance: 48.1 mL/min (A) (by C-G formula based on SCr of 0.33 mg/dL (L)).  Coagulation Profile:  Recent Labs Lab 05/21/16 0253 05/22/16 0409 05/23/16 0225 05/24/16 0225 05/25/16 0838  INR 1.31 1.37 1.56 2.06 1.90     Radiology Studies: No results found.   Medications:  Scheduled: . acidophilus  1 capsule Oral Daily  . cephALEXin  500 mg Oral Q8H  . diclofenac sodium  2 g Topical TID  . gabapentin  100 mg Oral QHS  . guaiFENesin  600 mg Oral BID  . hydroxyurea  500 mg Oral Q breakfast  . methimazole  5 mg Oral Once per day on Sun Thu  . nystatin   Topical BID  .  pantoprazole sodium  40 mg Oral Daily  . polyethylene glycol  17 g Oral Daily  . propranolol  5 mg Oral BID  . sodium chloride flush  3 mL Intravenous Q12H  . traZODone  50 mg Oral QHS  . warfarin  5 mg Oral ONCE-1800  . Warfarin - Pharmacist Dosing Inpatient   Does not apply q1800   Continuous: . heparin 1,000 Units/hr (05/24/16 2247)   NLG:[id number] **OR** acetaminophen, HYDROcodone-acetaminophen, ipratropium-albuterol, traMADol  Assessment/Plan:  Principal Problem:   DVT (deep venous thrombosis) (HCC) Active Problems:   Hyperthyroidism   Essential hypertension   Multinodular goiter   Stroke, embolic (HCC)   Aphasia S/P CVA   Left leg swelling     Acute DVT involving left lower extremity This seemed to have occurred while the patient was on Eliquis at home. Patient's daughter mentions that patient has been compliant. Patient is nonambulatory due to history of stroke. She has a history of thrombocytosis. Management of DVT discussed briefly with Dr. Bridgett Larsson with vascular surgery. No role for thrombolytics. Abdominal wall hematoma discussed with Dr. Ninfa Linden with general surgery. No role for any surgical intervention. Okay to initiate anticoagulation. Patient was started on IV heparin. Anticoagulation discussed in detail with patient's daughter. She was wary of going back on warfarin. Discussed with Dr. Alvy Bimler with hematology who follows the patient in her office. She also agrees that warfarin is the only way to go at this point in time. Patient has an appointment with Dr. Alvy Bimler on June 1. Discussed again with daughter and she was agreeable. Patient started on warfarin. Patient was on warfarin up until January of this year when it was changed over to Eliquis due to vaginal spotting. Patient never saw GYN. If she develops vaginal bleeding and spotting she will need to see a GYN. She'll remain stable. No bleeding. INR was 1.9 this morning. It was 2.0 yesterday. She will be given a  higher dose of warfarin tonight. If therapeutic tomorrow, she could be discharged.   Abdominal wall hematoma Detected on CT scan. Occurred during previous hospitalization. She was getting Lovenox injections during that stay. Discussed with general surgery (Dr. Ninfa Linden). No role for acute intervention. May need to be seen by general surgery in a month in their office for this to be drained. Hemoglobin stable.   Macrocytic anemia Hemoglobin is stable. TSH was checked in March and was normal. G-81 and folic acid levels are normal.   Urinary tract infection UA is noted to be abnormal. Previous culture  data reviewed. Patient was started on Keflex. She will complete course tomorrow morning.  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 Asymptomatic for the most part. Not a surgical candidate. Seen by ENT during previous hospitalization. Continue home medications.  History of stroke with right-sided hemiparesis and aphasia. Stable. Continue home medications.  History of thrombocytosis. Continue hydroxyurea. Followed by Dr. Alvy Bimler. Follow-up appointment on June 1.  Fecal impaction. She was given enema. Continue laxatives.  L2 vertebral fracture. Stable. Appears to be asymptomatic.  Abnormal appearance of the skin over right breast. Incidentally detected on CT scan. Previous mammogram report reviewed. This was done in April 2017. Progressive skin thickening was noted at that time as well. Biopsy was recommended. Patient did undergo Punch biopsy in May 2017, which showed inflammatory cells. No malignant cells reported.  Chronic dysphagia. Aspiration precautions. Dysphagia diet.  DVT Prophylaxis: IV heparin and warfarin.    Code Status: Partial code  Family Communication: Discussed with patient's daughter  Disposition Plan: Management as outlined above. Unfortunately, INR dropped a little bit today. She'll be  given a higher dose of warfarin tonight. Anticipate discharge tomorrow if INR is therapeutic.     LOS: 6 days   [facility] Pager [phone] 05/25/2016, 9:56 AM  If 7PM-7AM, please contact night-coverage at [email or web address], password [facility]

## 2016-05-26 LAB — CBC
HCT: 36.8 % (ref 36.0–46.0)
Hemoglobin: 11.6 g/dL — ABNORMAL LOW (ref 12.0–15.0)
MCH: 34.1 pg — ABNORMAL HIGH (ref 26.0–34.0)
MCHC: 31.5 g/dL (ref 30.0–36.0)
MCV: 108.2 fL — ABNORMAL HIGH (ref 78.0–100.0)
Platelets: 489 10*3/uL — ABNORMAL HIGH (ref 150–400)
RBC: 3.4 MIL/uL — ABNORMAL LOW (ref 3.87–5.11)
RDW: 14.9 % (ref 11.5–15.5)
WBC: 7.9 10*3/uL (ref 4.0–10.5)

## 2016-05-26 LAB — PROTIME-INR
INR: 2.31
Prothrombin Time: 25.8 seconds — ABNORMAL HIGH (ref 11.4–15.2)

## 2016-05-26 LAB — HEPARIN LEVEL (UNFRACTIONATED): Heparin Unfractionated: 0.45 IU/mL (ref 0.30–0.70)

## 2016-05-26 MED ORDER — PROPRANOLOL HCL 10 MG PO TABS: 5.0000 mg | ORAL_TABLET | Freq: Two times a day (BID) | ORAL | 5 refills | Status: DC

## 2016-05-26 MED ORDER — WARFARIN SODIUM 2 MG PO TABS: 4.0000 mg | ORAL_TABLET | Freq: Once | ORAL | 1 refills | Status: DC

## 2016-05-26 MED ORDER — WARFARIN SODIUM 4 MG PO TABS
4.0000 mg | ORAL_TABLET | Freq: Once | ORAL | Status: DC
Start: 2016-05-26 — End: 2016-05-26

## 2016-05-26 NOTE — Discharge Summary (Signed)
[facility]  Physician Discharge Summary   Patient ID: Sandy Barrett MRN: [id number] DOB/AGE: [birth year] 83 y.o.  Admit date: 05/18/2016 Discharge date: 05/26/2016  PCP: [name], MD  DISCHARGE DIAGNOSES:  Principal Problem:   DVT (deep venous thrombosis) ([place]) Active Problems:   Hyperthyroidism   Essential hypertension   Multinodular goiter   Stroke, embolic (HCC)   Aphasia S/P CVA   Left leg swelling   RECOMMENDATIONS FOR OUTPATIENT FOLLOW UP: 1. Home Health RN to do PT/INR blood work in 2 days time. 2. Outpatient follow up with general surgery (Dr. Ninfa Linden) in 3-4 weeks for abdominal wall hematoma   DISCHARGE CONDITION: fair  Diet recommendation: As before  Filed Weights   05/19/16 0000 05/19/16 0131  Weight: 74.8 kg (164 lb 14.5 oz) 70 kg (154 lb 4.8 oz)    INITIAL HISTORY: 83 year old African-American female with a past medical history of a stroke in March 2016, resulting in right-sided hemiparesis and expressive aphasia, history of prior DVT and PE for which we. She was anticoagulated with an request, history of goiter, not a surgical candidate, history of hypertension and recent hospitalization for aspiration pneumonia. Patient lives with her daughter. She is nonambulatory. She sought attention due to worsening swelling in her left leg. Family also noted swelling in her abdomen.  Consultants: Phone conversation with general surgery and vascular surgery and hematology  Procedures:   Lower extremity venous Doppler Occlusive DVT noted in the Left common femoral, femoral, and profunda femoral veins. Partial DVT noted in the Left popliteal vein. Difficult to visualize calf veins due to patient position, but no obvious DVT. No DVT RLE. IVC/left iliac duplex: Based on Color and pulsed wave Doppler, appears to be occlusive DVT in the left iliac vein. IVC appears patent.  HOSPITAL COURSE:   Acute DVT involving left lower extremity This seemed to  have occurred while the patient was on Eliquis at home. Patient's daughter mentions that patient has been compliant. Patient is nonambulatory due to history of stroke. She has a history of thrombocytosis. Management of DVT discussed briefly with Dr. Bridgett Larsson with vascular surgery. No role for thrombolytics. Abdominal wall hematoma discussed with Dr. Ninfa Linden with general surgery. No role for any surgical intervention. Patient was started on IV heparin. Anticoagulation discussed in detail with patient's daughter. She was wary of going back on warfarin. Discussed with Dr. Alvy Bimler with hematology who follows the patient in her office. She also agrees that warfarin is the only way to go at this point in time. Patient has an appointment with Dr. Alvy Bimler on June 1. Discussed again with daughter and she was agreeable. Patient started on warfarin. Patient was on warfarin up until January of this year when it was changed over to Eliquis due to vaginal spotting. Patient never saw GYN. If she develops vaginal bleeding and spotting she will need to see a GYN. INR is therapeutic. Okay for discharge today. Home health to draw blood work in 2 days to follow PT/INR.   Abdominal wall hematoma Detected on CT scan. Occurred during previous hospitalization. She was getting Lovenox injections during that stay. Discussed with general surgery (Dr. Ninfa Linden). No role for acute intervention. May need to be seen by general surgery in a month in their office for this to be drained. This has been communicated to their office. They will call for appointment.  Macrocytic anemia Hemoglobin is stable. TSH was checked in March and was normal. B-44 and folic acid levels are normal.   Urinary tract  infection UA is noted to be abnormal. Previous culture data reviewed. Patient was started on Keflex. She has completed a course.  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 Asymptomatic for the most part. Not a surgical candidate. Seen by ENT during previous hospitalization. Continue home medications.  History of stroke with right-sided hemiparesis and aphasia. Stable. Continue home medications.  History of thrombocytosis. Continue hydroxyurea. Followed by Dr. Alvy Bimler. Follow-up appointment on June 1.  Fecal impaction. She was given enema. Continue laxatives.  L2 vertebral fracture. Stable. Appears to be asymptomatic.  Abnormal appearance of the skin over right breast. Incidentally detected on CT scan. Previous mammogram report reviewed. This was done in April 2017. Progressive skin thickening was noted at that time as well. Biopsy was recommended. Patient did undergo Punch biopsy in May 2017, which showed inflammatory cells. No malignant cells reported.  Chronic dysphagia. Aspiration precautions. Dysphagia diet.  Overall, stable. Excited about going home today. Okay for discharge.   PERTINENT LABS:  The results of significant diagnostics from this hospitalization (including imaging, microbiology, ancillary and laboratory) are listed below for reference.     Labs: Basic Metabolic Panel:  Recent Labs Lab 05/20/16 0259 05/22/16 0409  NA 140 141  K 4.0 3.8  CL 108 108  CO2 26 26  GLUCOSE 103* 88  BUN 11 8  CREATININE 0.34* 0.33*  CALCIUM 8.9 8.9   CBC:  Recent Labs Lab 05/22/16 0409 05/23/16 0225 05/24/16 0225 05/25/16 0304 05/26/16 0208  WBC 6.8 7.1 7.5 6.7 7.9  HGB 11.5* 11.4* 11.4* 11.5* 11.6*  HCT 37.2 36.0 36.3 36.6 36.8  MCV 109.1* 108.8* 108.4* 108.3* 108.2*  PLT 501* 515* 476* 509* 489*     IMAGING STUDIES Ct Abdomen Pelvis W Contrast  Result Date: 05/18/2016 CLINICAL DATA:  Abdominal mass. Poor historian. Left lower leg swelling. EXAM: CT ABDOMEN AND PELVIS WITH CONTRAST TECHNIQUE: Multidetector CT imaging of the abdomen and pelvis was performed using the standard protocol following bolus  administration of intravenous contrast. CONTRAST:  177mL ISOVUE-300 IOPAMIDOL (ISOVUE-300) INJECTION 61% COMPARISON:  CT of the abdomen and pelvis on 06/27/2015 in CT of the chest on 04/21/2016 FINDINGS: Lower chest: The heart is enlarged. Numerous left chest wall collaterals, consistent with superior vena cava compression from goiter. Pectus excavatum. Hepatobiliary: The liver is normal in appearance. A 1 cm gallstone is present. Pancreas: Unremarkable. No pancreatic ductal dilatation or surrounding inflammatory changes. Spleen: Normal in size without focal abnormality. Adrenals/Urinary Tract: Adrenal glands are unremarkable. Normal enhancement and excretion from both kidneys. There are bilateral parapelvic cysts. Stomach/Bowel: The stomach is normal in appearance. No small bowel dilatation or bowel wall thickening. Anterior abdominal wall hernia contains nondilated loops of small bowel. Large bowel loops contain a large amount of stool. Impacted stool identified within the rectosigmoid colon. Vascular/Lymphatic: There is atherosclerotic calcification of the abdominal aorta. No retroperitoneal or mesenteric adenopathy. Reproductive: Hysterectomy.  No adnexal mass. Other: Lobulated anterior abdominal wall mass is identified in the subcutaneous tissues extending from the left iliac crest, crossing midline. Mass measures approximately 23 x 3.9 x 4.2 cm. The density favors hematoma. The skin of the right breast is markedly thickened, consistent with dependent edema and/or inflammatory malignancy. Musculoskeletal: Biconcave fracture of L3. Superior endplate fracture of L2, L1. Right hip arthroplasty. IMPRESSION: 1. Anterior abdominal wall mass is new since the previous exam. Given its size and density, hematoma is favored. Correlation with physical exam, ecchymosis, and  history is recommended. 2. Rectosigmoid impaction. 3. Cardiomegaly. 4. Multiple venous collaterals, consistent with relative superior vena cava  obstruction. Patient is known to have a large goiter with displacement and effacement of superior vena cava. 5. Bilateral parapelvic renal cysts. 6. Anterior abdominal wall hernia containing nondilated loops of small bowel. 7. Prior hysterectomy. 8. Numerous vertebral fractures ; L2 fracture is new since June of 2017. 9. Abnormal right breast consistent with edema or malignancy. Diagnostic evaluation with mammogram and ultrasound recommended. Electronically Signed   By: Nolon Nations M.D.   On: 05/18/2016 22:34    DISCHARGE EXAMINATION: Vitals:   05/25/16 0643 05/25/16 1300 05/25/16 2047 05/26/16 0500  BP: 112/60 (!) 89/53 (!) 119/58 109/65  Pulse: 84 82 89 84  Resp: 19 20 18 18   Temp: 97.7 F (36.5 C) 98.3 F (36.8 C) 97.7 F (36.5 C) 98 F (36.7 C)  TempSrc: Oral Oral  Oral  SpO2: 92% 99% 95% 93%  Weight:      Height:       General appearance: alert, cooperative, appears stated age and no distress Resp: clear to auscultation bilaterally Cardio: regular rate and rhythm, S1, S2 normal, no murmur, click, rub or gallop GI: soft, non-tender; bowel sounds normal; no masses,  no organomegaly  DISPOSITION: Home with daughter via [facility]  Discharge Instructions    Call MD for:  difficulty breathing, headache or visual disturbances    Complete by:  As directed    Call MD for:  extreme fatigue    Complete by:  As directed    Call MD for:  persistant dizziness or light-headedness    Complete by:  As directed    Call MD for:  persistant nausea and vomiting    Complete by:  As directed    Call MD for:  severe uncontrolled pain    Complete by:  As directed    Call MD for:  temperature >100.4    Complete by:  As directed    Discharge instructions    Complete by:  As directed    Please take her medications as prescribed. Please monitor carefully for signs of bleeding including black stool, blood in stool, blood in urine. Or bleeding from any other site. If you feel dizzy, lightheaded,  please seek attention immediately. He will benefit from following up with gynecology if you start noticing vaginal spotting.  You were cared for by a hospitalist during your hospital stay. If you have any questions about your discharge medications or the care you received while you were in the hospital after you are discharged, you can call the unit and asked to speak with the hospitalist on call if the hospitalist that took care of you is not available. Once you are discharged, your primary care physician will handle any further medical issues. Please note that NO REFILLS for any discharge medications will be authorized once you are discharged, as it is imperative that you return to your primary care physician (or establish a relationship with a primary care physician if you do not have one) for your aftercare needs so that they can reassess your need for medications and monitor your lab values. If you do not have a primary care physician, you can call [phone] for a physician referral.   Increase activity slowly    Complete by:  As directed       ALLERGIES: No Known Allergies   Current Discharge Medication List    START taking these medications   Details  warfarin (COUMADIN) 2 MG tablet Take 2 tablets (4 mg total) by mouth one time only at 6 PM. Qty: 60 tablet, Refills: 1      CONTINUE these medications which have CHANGED   Details  propranolol (INDERAL) 10 MG tablet Take 0.5 tablets (5 mg total) by mouth 2 (two) times daily. Qty: 60 tablet, Refills: 5      CONTINUE these medications which have NOT CHANGED   Details  acetaminophen (TYLENOL) 160 MG/5ML liquid Take 480 mg by mouth every 4 (four) hours as needed for fever or pain.    albuterol (PROVENTIL HFA) 108 (90 Base) MCG/ACT inhaler Inhale 2 puffs into the lungs every 6 (six) hours as needed for wheezing or shortness of breath. Qty: 1 Inhaler, Refills: 1   Associated Diagnoses: Cough    diclofenac sodium (VOLTAREN) 1 % GEL APPLY  (2GMS) TOPICALLY THREE TIMES DAILY. Qty: 300 g, Refills: 5    gabapentin (NEURONTIN) 100 MG capsule Take 1 capsule (100 mg total) by mouth at bedtime.    guaiFENesin (MUCINEX) 600 MG 12 hr tablet Take 1 tablet (600 mg total) by mouth 2 (two) times daily. Qty: 10 tablet, Refills: 0    hydroxyurea (HYDREA) 500 MG capsule Take 2 capsules (1,000 mg total) by mouth daily. May take with food to minimize GI side effects. Qty: 60 capsule, Refills: 9    ipratropium-albuterol (DUONEB) 0.5-2.5 (3) MG/3ML SOLN Take 3 mLs by nebulization every 6 (six) hours as needed. Qty: 360 mL, Refills: 0    methimazole (TAPAZOLE) 5 MG tablet TAKE 1 TABLET BY MOUTH TWICE WEEKLY Qty: 12 tablet, Refills: 2    polyethylene glycol (MIRALAX / GLYCOLAX) packet TAKE 17G BY MOUTH TWICE A DAY Qty: 14 packet, Refills: 1    Probiotic Product (ALIGN) 4 MG CAPS Take 1 capsule by mouth daily.     PROTONIX 40 MG PACK TAKE 20 MLS (40 MG TOTAL) BY MOUTH DAILY. Qty: 30 each, Refills: 5    terconazole (TERAZOL 7) 0.4 % vaginal cream Place 1 applicator vaginally at bedtime. Qty: 90 g, Refills: 3    traMADol (ULTRAM) 50 MG tablet TAKE 2 TABLETS BY MOUTH 3 TIMES A DAY AS NEEDED Qty: 180 tablet, Refills: 1    traZODone (DESYREL) 50 MG tablet TAKE 1 TABLET (50 MG TOTAL) BY MOUTH AT BEDTIME AS NEEDED. FOR SLEEP Qty: 30 tablet, Refills: 5      STOP taking these medications     ELIQUIS 2.5 MG TABS tablet          Follow-up Information    [facility] Follow up.   Why:  HHRN/PT/OT/aide resumed- with INR check on 5/24-  Contact information: [place] [address] [phone]        Heath Lark, MD Follow up on 06/05/2016.   Specialty:  Hematology and Oncology Why:  appointment is on 06/05/16. Blood work at [employer] and appt with dotor at 1:30PM. Contact information: [place] [address] [phone]        Coralie Keens, MD Follow up.     Specialty:  General Surgery Why:  his office will call to schedule appt for the blood collection in abdominal wall. Contact information: [address] [place] [address] [phone]           TOTAL DISCHARGE TIME: 35 mins  [facility] Pager [phone]  05/26/2016, 1:47 PM

## 2016-05-26 NOTE — Progress Notes (Signed)
[facility] has been called for transportation- plan for pickup around 2pm per family request

## 2016-05-26 NOTE — Care Management Note (Signed)
Case Management Note Previous CM note initiated by Erenest Rasher, RN 05/19/2016, 10:59 AM   Patient Details  Name: Sandy Barrett MRN: [id number] Date of Birth: 1932/08/19  Subjective/Objective:  Leg pain and swelling                   Action/Plan: Discharge Planning: NCM spoke to pt's dtr, [name] at bedside. She lives in home with patient. Pt has hospital bed, wheelchair, RW, bedside commode and hoyer lift. Had [place] in the past. Will continue to follow for dc needs.  PCP BURNS, Claudina Lick MD  Expected Discharge Date:                  Expected Discharge Plan:  [place]  In-House Referral:  NA  Discharge planning Services  CM Consult  Post Acute Care Choice:  Home Health, Resumption of Svcs/PTA Provider Choice offered to:  Adult Children, Patient  DME Arranged:  N/A DME Agency:  NA  HH Arranged:  RN, PT, OT, Nurse's Aide [place] Agency:  [facility]  Status of Service:  Completed, signed off  If discussed at [place] of Stay Meetings, dates discussed:  5/22  Discharge Disposition: home with home health   Additional Comments:  05/26/16- 1030- Thomasine Klutts RN, CM- pt for d/c home today- [place] orders have been placed to resume services with [facility]- have notified karen with [facility] for resumption of [place] RN/PT/OT/aide- and need for INR check on 5/24- pt will need transport home via EMS- CM will call [facility] when pt ready per bedside RN.   05/21/16- 1400- Steffanie Dunn Cloy Cozzens RN, CM- spoke with Santiago Glad at [facility]- pt active with them PTA for [facility] services- RN/PT/OT/aide- will need resumption orders at time of discharge. CM to follow  Dawayne Patricia, RN 05/26/2016, 10:49 AM [phone]

## 2016-05-26 NOTE — Progress Notes (Addendum)
[place] for heparin, coumadin Indication: DVT  No Known Allergies  Patient Measurements: Height: 5' (152.4 cm) Weight: 154 lb 4.8 oz (70 kg) IBW/kg (Calculated) : 45.5 Heparin Dosing Weight: 60kg  Vital Signs: Temp: 98 F (36.7 C) (05/22 0500) Temp Source: Oral (05/22 0500) BP: 109/65 (05/22 0500) Pulse Rate: 84 (05/22 0500)  Labs:  Recent Labs  05/24/16 0225 05/25/16 0304 05/25/16 0838 05/26/16 0208  HGB 11.4* 11.5*  --  11.6*  HCT 36.3 36.6  --  36.8  PLT 476* 509*  --  489*  LABPROT 23.5*  --  22.0* 25.8*  INR 2.06  --  1.90 2.31  HEPARINUNFRC 0.44  --  0.34 0.45    Estimated Creatinine Clearance: 48.1 mL/min (A) (by C-G formula based on SCr of 0.33 mg/dL (L)).   Scheduled:  . acidophilus  1 capsule Oral Daily  . diclofenac sodium  2 g Topical TID  . gabapentin  100 mg Oral QHS  . guaiFENesin  600 mg Oral BID  . hydroxyurea  500 mg Oral Q breakfast  . methimazole  5 mg Oral Once per day on Sun Thu  . nystatin   Topical BID  . pantoprazole sodium  40 mg Oral Daily  . polyethylene glycol  17 g Oral Daily  . propranolol  5 mg Oral BID  . sodium chloride flush  3 mL Intravenous Q12H  . traZODone  50 mg Oral QHS  . Warfarin - Pharmacist Dosing Inpatient   Does not apply q1800    Assessment: 83 yo female on heparin bridge to coumadin for LLE DVT (also with history of DVT/PE on apixaban PTA) and noted with abdominal wall hematoma, no surgical intervention needed for now - ok to start anticoagulation per MD. Completed 5 days of coumadin/heparin overlap. CBC stable.  Heparin level remains therapeutic on 1000 units/hr, INR therapeutic, trended up to 2.31 (after slight dip to 1.9 yesterday). No further bleed issues documented. Likely to discharge today per MD note.   Goal of Therapy:  Heparin level 0.3-0.7 units/ml  INR 2-3 Monitor platelets by anticoagulation protocol: Yes   Plan:  -Heparin at 1000 units/hr -Coumadin  4mg  PO x 1 today -Daily heparin level/INR/CBC -Monitor for s/sx bleeding   Elicia Lamp, PharmD, BCPS Clinical Pharmacist 05/26/2016 8:24 AM

## 2016-05-26 NOTE — Progress Notes (Signed)
Discharged per MD order. LUA IV removed, CCMD notified of tele d/c. AVS given to patients daughter. Addressed medications and follow up appointments. No questions at this time. Patient to home via [facility].  Cyndia Bent RN

## 2016-05-27 ENCOUNTER — Telehealth: Payer: Self-pay | Admitting: *Deleted

## 2016-05-27 DIAGNOSIS — I69320 Aphasia following cerebral infarction: Secondary | ICD-10-CM | POA: Diagnosis not present

## 2016-05-27 DIAGNOSIS — R1310 Dysphagia, unspecified: Secondary | ICD-10-CM | POA: Diagnosis not present

## 2016-05-27 DIAGNOSIS — I10 Essential (primary) hypertension: Secondary | ICD-10-CM | POA: Diagnosis not present

## 2016-05-27 DIAGNOSIS — I69351 Hemiplegia and hemiparesis following cerebral infarction affecting right dominant side: Secondary | ICD-10-CM | POA: Diagnosis not present

## 2016-05-27 DIAGNOSIS — D510 Vitamin B12 deficiency anemia due to intrinsic factor deficiency: Secondary | ICD-10-CM | POA: Diagnosis not present

## 2016-05-27 DIAGNOSIS — I69391 Dysphagia following cerebral infarction: Secondary | ICD-10-CM | POA: Diagnosis not present

## 2016-05-27 NOTE — Telephone Encounter (Signed)
Pt was on TCM list admitted 5/14 for DVT. Pt was D/C 5/22, and will be f/u with  Coralie Keens, MD Follow up.   Specialty:  General Surgery Why:  his office will call to schedule appt for the blood collection in abdominal wall. Contact information

## 2016-05-28 ENCOUNTER — Ambulatory Visit (INDEPENDENT_AMBULATORY_CARE_PROVIDER_SITE_OTHER): Payer: Medicare Other | Admitting: General Practice

## 2016-05-28 DIAGNOSIS — R1310 Dysphagia, unspecified: Secondary | ICD-10-CM | POA: Diagnosis not present

## 2016-05-28 DIAGNOSIS — I10 Essential (primary) hypertension: Secondary | ICD-10-CM | POA: Diagnosis not present

## 2016-05-28 DIAGNOSIS — I69351 Hemiplegia and hemiparesis following cerebral infarction affecting right dominant side: Secondary | ICD-10-CM | POA: Diagnosis not present

## 2016-05-28 DIAGNOSIS — Z86711 Personal history of pulmonary embolism: Secondary | ICD-10-CM

## 2016-05-28 DIAGNOSIS — D510 Vitamin B12 deficiency anemia due to intrinsic factor deficiency: Secondary | ICD-10-CM | POA: Diagnosis not present

## 2016-05-28 DIAGNOSIS — I69320 Aphasia following cerebral infarction: Secondary | ICD-10-CM | POA: Diagnosis not present

## 2016-05-28 DIAGNOSIS — I69391 Dysphagia following cerebral infarction: Secondary | ICD-10-CM | POA: Diagnosis not present

## 2016-05-28 LAB — POCT INR: INR: 3.1

## 2016-05-28 NOTE — Patient Instructions (Signed)
Pre visit review using our clinic review tool, if applicable. No additional management support is needed unless otherwise documented below in the visit note. 

## 2016-05-28 NOTE — Progress Notes (Signed)
I have reviewed and agree with the plan. 

## 2016-05-29 DIAGNOSIS — I69351 Hemiplegia and hemiparesis following cerebral infarction affecting right dominant side: Secondary | ICD-10-CM | POA: Diagnosis not present

## 2016-05-29 DIAGNOSIS — I69320 Aphasia following cerebral infarction: Secondary | ICD-10-CM | POA: Diagnosis not present

## 2016-05-29 DIAGNOSIS — I10 Essential (primary) hypertension: Secondary | ICD-10-CM | POA: Diagnosis not present

## 2016-05-29 DIAGNOSIS — I69391 Dysphagia following cerebral infarction: Secondary | ICD-10-CM | POA: Diagnosis not present

## 2016-05-29 DIAGNOSIS — D510 Vitamin B12 deficiency anemia due to intrinsic factor deficiency: Secondary | ICD-10-CM | POA: Diagnosis not present

## 2016-05-29 DIAGNOSIS — R1310 Dysphagia, unspecified: Secondary | ICD-10-CM | POA: Diagnosis not present

## 2016-06-02 ENCOUNTER — Telehealth: Payer: Self-pay | Admitting: Emergency Medicine

## 2016-06-02 NOTE — Telephone Encounter (Signed)
Received VM from [place] with [facility], needing verbals for OT 1 x 2 w, 2x 1 w. Spoke with Dianne to give verbal orders.  Received VM from Pts Daughter, states bumps are gone under pts arm but still red and irritated, leg is still swollen some, should she be using compression socks still? And urine is still smelling strong, is UA needed?  CB for UA orders [phone]

## 2016-06-02 NOTE — Telephone Encounter (Signed)
Agree with verbal orders.   Order UA, UCx.  Does rash itch?  We can try a cream to see if it helps  - let me know and I will send to pof.    Yes, continue compression socks.

## 2016-06-03 ENCOUNTER — Telehealth: Payer: Self-pay | Admitting: Internal Medicine

## 2016-06-03 DIAGNOSIS — I10 Essential (primary) hypertension: Secondary | ICD-10-CM | POA: Diagnosis not present

## 2016-06-03 DIAGNOSIS — I69391 Dysphagia following cerebral infarction: Secondary | ICD-10-CM | POA: Diagnosis not present

## 2016-06-03 DIAGNOSIS — D510 Vitamin B12 deficiency anemia due to intrinsic factor deficiency: Secondary | ICD-10-CM | POA: Diagnosis not present

## 2016-06-03 DIAGNOSIS — I69320 Aphasia following cerebral infarction: Secondary | ICD-10-CM | POA: Diagnosis not present

## 2016-06-03 DIAGNOSIS — I69351 Hemiplegia and hemiparesis following cerebral infarction affecting right dominant side: Secondary | ICD-10-CM | POA: Diagnosis not present

## 2016-06-03 DIAGNOSIS — R1310 Dysphagia, unspecified: Secondary | ICD-10-CM | POA: Diagnosis not present

## 2016-06-03 NOTE — Telephone Encounter (Signed)
Please advise, I wasn't able to give verbal orders for UA & UC. I will give to [facility] when I call back for INR

## 2016-06-03 NOTE — Telephone Encounter (Signed)
Called and stated the pt still has a raised rash under her arm pit and the pt is having some pain but no itching. Would like something called in.

## 2016-06-03 NOTE — Telephone Encounter (Signed)
What needs to be done?

## 2016-06-03 NOTE — Telephone Encounter (Signed)
Called and gave pts results for today.   PTI 28.4 INR 2.4   Wants to know when they should recheck it. Please call back.

## 2016-06-04 DIAGNOSIS — I10 Essential (primary) hypertension: Secondary | ICD-10-CM | POA: Diagnosis not present

## 2016-06-04 DIAGNOSIS — D510 Vitamin B12 deficiency anemia due to intrinsic factor deficiency: Secondary | ICD-10-CM | POA: Diagnosis not present

## 2016-06-04 DIAGNOSIS — I69391 Dysphagia following cerebral infarction: Secondary | ICD-10-CM | POA: Diagnosis not present

## 2016-06-04 DIAGNOSIS — I69351 Hemiplegia and hemiparesis following cerebral infarction affecting right dominant side: Secondary | ICD-10-CM | POA: Diagnosis not present

## 2016-06-04 DIAGNOSIS — R1310 Dysphagia, unspecified: Secondary | ICD-10-CM | POA: Diagnosis not present

## 2016-06-04 DIAGNOSIS — I69320 Aphasia following cerebral infarction: Secondary | ICD-10-CM | POA: Diagnosis not present

## 2016-06-04 MED ORDER — TRIAMCINOLONE ACETONIDE 0.5 % EX OINT: 1.0000 "application " | TOPICAL_OINTMENT | Freq: Two times a day (BID) | CUTANEOUS | 0 refills | Status: DC

## 2016-06-04 NOTE — Telephone Encounter (Signed)
Spoke with pts daughter to inform.  

## 2016-06-04 NOTE — Telephone Encounter (Signed)
Please see INR results below. When does pts INR need to be rechecked again.

## 2016-06-04 NOTE — Telephone Encounter (Signed)
rx sent to pharmacy.  Ok to wait but if there is a concern regarding sugars she should call Dr Cruzita Lederer

## 2016-06-04 NOTE — Telephone Encounter (Signed)
Spoke with Patients daughter, states pt is not complaining of itching, only redness and some pain from irritation under her arm. She would [facility].   The first appt available for Dr Renne Crigler is the end of July. Is it okay to wait that long for them to see endo?

## 2016-06-04 NOTE — Telephone Encounter (Signed)
Spoke with PCM at [facility] and she states that pts nursing [place] will not end until 06/15/16. She has noted in pts chart that Dr Burn  Would like to extend [facility] if pt has not met all of goal.

## 2016-06-04 NOTE — Telephone Encounter (Signed)
Informed Laurette Schimke and Pts daughter of note. Also, gave verbals to [facility] for UA and UC. Pt only has a few more visits with HH, does this need to be extended?

## 2016-06-04 NOTE — Telephone Encounter (Signed)
INR good.  Recheck in one month.  No change in dose

## 2016-06-04 NOTE — Telephone Encounter (Signed)
Yes, should be extended

## 2016-06-05 ENCOUNTER — Telehealth: Payer: Self-pay | Admitting: Hematology and Oncology

## 2016-06-05 ENCOUNTER — Other Ambulatory Visit (HOSPITAL_BASED_OUTPATIENT_CLINIC_OR_DEPARTMENT_OTHER): Payer: Medicare Other

## 2016-06-05 ENCOUNTER — Ambulatory Visit (HOSPITAL_BASED_OUTPATIENT_CLINIC_OR_DEPARTMENT_OTHER): Payer: Medicare Other | Admitting: Hematology and Oncology

## 2016-06-05 DIAGNOSIS — D473 Essential (hemorrhagic) thrombocythemia: Secondary | ICD-10-CM

## 2016-06-05 DIAGNOSIS — I82403 Acute embolism and thrombosis of unspecified deep veins of lower extremity, bilateral: Secondary | ICD-10-CM | POA: Diagnosis not present

## 2016-06-05 DIAGNOSIS — L819 Disorder of pigmentation, unspecified: Secondary | ICD-10-CM

## 2016-06-05 DIAGNOSIS — S301XXS Contusion of abdominal wall, sequela: Secondary | ICD-10-CM

## 2016-06-05 DIAGNOSIS — S301XXA Contusion of abdominal wall, initial encounter: Secondary | ICD-10-CM | POA: Diagnosis present

## 2016-06-05 DIAGNOSIS — Z86711 Personal history of pulmonary embolism: Secondary | ICD-10-CM

## 2016-06-05 LAB — BASIC METABOLIC PANEL
Anion Gap: 8 mEq/L (ref 3–11)
BUN: 14.3 mg/dL (ref 7.0–26.0)
CO2: 28 mEq/L (ref 22–29)
Calcium: 9.1 mg/dL (ref 8.4–10.4)
Chloride: 108 mEq/L (ref 98–109)
Creatinine: 0.5 mg/dL — ABNORMAL LOW (ref 0.6–1.1)
EGFR: >90 mL/min/{1.73_m2} (ref >90)
Glucose: 95 mg/dl (ref 70–140)
Potassium: 3.8 mEq/L (ref 3.5–5.1)
Sodium: 144 mEq/L (ref 136–145)

## 2016-06-05 LAB — CBC WITH DIFFERENTIAL/PLATELET
BASO%: 0.8 % (ref 0.0–2.0)
Basophils Absolute: 0.1 10*3/uL (ref 0.0–0.1)
EOS%: 1.6 % (ref 0.0–7.0)
Eosinophils Absolute: 0.1 10*3/uL (ref 0.0–0.5)
HCT: 42.8 % (ref 34.8–46.6)
HGB: 13.8 g/dL (ref 11.6–15.9)
LYMPH%: 14.1 % (ref 14.0–49.7)
MCH: 35 pg — ABNORMAL HIGH (ref 25.1–34.0)
MCHC: 32.3 g/dL (ref 31.5–36.0)
MCV: 108.4 fL — ABNORMAL HIGH (ref 79.5–101.0)
MONO#: 0.4 10*3/uL (ref 0.1–0.9)
MONO%: 6.5 % (ref 0.0–14.0)
NEUT#: 5 10*3/uL (ref 1.5–6.5)
NEUT%: 77 % — ABNORMAL HIGH (ref 38.4–76.8)
Platelets: 501 10*3/uL — ABNORMAL HIGH (ref 145–400)
RBC: 3.95 10*6/uL (ref 3.70–5.45)
RDW: 15.8 % — ABNORMAL HIGH (ref 11.2–14.5)
WBC: 6.4 10*3/uL (ref 3.9–10.3)
lymph#: 0.9 10*3/uL (ref 0.9–3.3)

## 2016-06-05 NOTE — Telephone Encounter (Signed)
Appointment scheduled per 06/05/16 los. Patient was given a copy of the AVS report and appointment schedule, per 06/05/16 los.

## 2016-06-06 ENCOUNTER — Encounter: Payer: Self-pay | Admitting: Hematology and Oncology

## 2016-06-06 DIAGNOSIS — S301XXS Contusion of abdominal wall, sequela: Secondary | ICD-10-CM | POA: Insufficient documentation

## 2016-06-06 NOTE — Assessment & Plan Note (Signed)
She has history of DVT and bilateral lower extremity edema I recommend she continues using elastic compression hose. Due to recent possibility of new DVT while on Eliquis, and also significant hematoma recently from Lovenox injection, Coumadin would be the safest choice at present time I explained to the daughter extensively the rationale of switching her back to Coumadin

## 2016-06-06 NOTE — Assessment & Plan Note (Signed)
The patient has abnormal imaging over the right breast Her breast appears thickened She had extensive evaluation with mammogram and biopsy of the skin in the past without signs of cancer I suspect the abnormal skin appearance due to lymphedema from poor venous circulation on her chest wall from extensive blood clots in the past We will continue close monitoring

## 2016-06-06 NOTE — Progress Notes (Signed)
[facility] OFFICE PROGRESS NOTE  Patient Care Team: [name], MD as PCP - General (Internal Medicine) Eunice Blase, MD (Inactive) as Consulting Physician (Family Medicine) Renato Shin, MD as Consulting Physician (Endocrinology) Netta Cedars, MD as Consulting Physician (Orthopedic Surgery) Lyndal Pulley, DO (Sports Medicine) Garvin Fila, MD (Neurology)  SUMMARY OF ONCOLOGIC HISTORY:   Essential thrombocytosis ([place])   08/07/2013 Miscellaneous    The patient is noted to have elevated platelet count      03/08/2014 Imaging    Positive for acute PE with CT evidence of right heart strain (RV/LV Ratio = 0.9) consistent with at least submassive (intermediate risk)PE.       03/09/2014 Imaging    US venous Doppler showed deep vein thrombosis noted in the right distal common femoral vein, femoral vein, and popliteal vein. DVT noted in the left femoral and popliteal veins      03/09/2014 Imaging    Patchy areas of acute left MCA territory infarction. 2. Several punctate foci of acute right MCA territory infarction. Possible trace subarachnoid hemorrhage in the high right frontal lobe. 3. Occluded left ICA and left MCA      03/20/2015 Imaging    Compared to MRI on 03/09/14, there has been expected evolutional change of left MCA infarction. In addition, there may be a few areas of acute-subacute infarcts in the left basal ganglia vs artifact.        07/31/2015 Pathology Results    Peripheral blood is positive for JAK2 mutation      07/31/2015 -  Chemotherapy    She is started on 500 mg daily Hydrea      08/15/2015 Miscellaneous    The dose of hydroxyurea is increased to 1000 mg daily      09/26/2015 Miscellaneous    Dose of Hydrea to 500 mg daily except on Saturdays and Sundays she takes 1000 mg.      10 /20/2017 Adverse Reaction    Dose of Hydrea is reduced to 500 mg daily        INTERVAL HISTORY: Please see below for problem oriented charting. Since the  last time I saw her, she had recurrent hospitalizations In April 2018, she had aspiration pneumonia Most recently, she had new DVT and significant abdominal wall hematoma from Lovenox injection Ultimately, her anticoagulation therapy was switched back to warfarin therapy Due to her dysarthria, history is not possible.  Her daughter is here accompanying her The patient denies any recent signs or symptoms of bleeding such as spontaneous epistaxis, hematuria or hematochezia. There were no reported recent fever or chills or episodes of aspiration pneumonia  REVIEW OF SYSTEMS:  Unable to obtain  I have reviewed the past medical history, past surgical history, social history and family history with the patient and they are unchanged from previous note.  ALLERGIES:  has No Known Allergies.  MEDICATIONS:  Current Outpatient Prescriptions  Medication Sig Dispense Refill  . acetaminophen (TYLENOL) 160 MG/5ML liquid Take 480 mg by mouth every 4 (four) hours as needed for fever or pain.    [name] albuterol (PROVENTIL HFA) 108 (90 Base) MCG/ACT inhaler Inhale 2 puffs into the lungs every 6 (six) hours as needed for wheezing or shortness of breath. 1 Inhaler 1  . diclofenac sodium (VOLTAREN) 1 % GEL APPLY (2GMS) TOPICALLY THREE TIMES DAILY. 300 g 5  . gabapentin (NEURONTIN) 100 MG capsule Take 1 capsule (100 mg total) by mouth at bedtime.    [name] guaiFENesin (MUCINEX) 600  MG 12 hr tablet Take 1 tablet (600 mg total) by mouth 2 (two) times daily. (Patient taking differently: Take 600 mg by mouth 2 (two) times daily as needed for cough or to loosen phlegm. ) 10 tablet 0  . hydroxyurea (HYDREA) 500 MG capsule Take 2 capsules (1,000 mg total) by mouth daily. May take with food to minimize GI side effects. (Patient taking differently: Take 500 mg by mouth daily. ) 60 capsule 9  . ipratropium-albuterol (DUONEB) 0.5-2.5 (3) MG/3ML SOLN Take 3 mLs by nebulization every 6 (six) hours as needed. (Patient taking differently:  Take 3 mLs by nebulization every 6 (six) hours as needed (shortness of breath). ) 360 mL 0  . methimazole (TAPAZOLE) 5 MG tablet TAKE 1 TABLET BY MOUTH TWICE WEEKLY (Patient taking differently: TAKE 1 TABLET BY MOUTH TWICE WEEKLY (Sun and Thurs)) 12 tablet 2  . polyethylene glycol (MIRALAX / GLYCOLAX) packet TAKE 17G BY MOUTH TWICE A DAY (Patient taking differently: TAKE 17G BY MOUTH TWICE A DAY AS NEED FOR CONSTIPATION) 14 packet 1  . Probiotic Product (ALIGN) 4 MG CAPS Take 1 capsule by mouth daily.     . propranolol (INDERAL) 10 MG tablet Take 0.5 tablets (5 mg total) by mouth 2 (two) times daily. 60 tablet 5  . PROTONIX 40 MG PACK TAKE 20 MLS (40 MG TOTAL) BY MOUTH DAILY. (Patient taking differently: Take 40 mg by mouth daily. Mix with applesauce) 30 each 5  . terconazole (TERAZOL 7) 0.4 % vaginal cream Place 1 applicator vaginally at bedtime. (Patient taking differently: Place 1 applicator vaginally at bedtime as needed (irritation). ) 90 g 3  . traMADol (ULTRAM) 50 MG tablet TAKE 2 TABLETS BY MOUTH 3 TIMES A DAY AS NEEDED (Patient taking differently: Take 100 mg by mouth 3 (three) times daily as needed for moderate pain. ) 180 tablet 1  . traZODone (DESYREL) 50 MG tablet TAKE 1 TABLET (50 MG TOTAL) BY MOUTH AT BEDTIME AS NEEDED. FOR SLEEP (Patient taking differently: Take 50 mg by mouth at bedtime. for sleep) 30 tablet 5  . triamcinolone ointment (KENALOG) 0.5 % Apply 1 application topically 2 (two) times daily. 30 g 0  . warfarin (COUMADIN) 2 MG tablet Take 2 tablets (4 mg total) by mouth one time only at 6 PM. 60 tablet 1   No current facility-administered medications for this visit.     PHYSICAL EXAMINATION: ECOG PERFORMANCE STATUS: 2 - Symptomatic, <50% confined to bed  Vitals:   06/05/16 1406  BP: (!) 100/58  Pulse: 74  Resp: 18  Temp: 97.9 F (36.6 C)   Filed Weights    GENERAL:alert, no distress and comfortable SKIN: skin color, texture, turgor are normal, no rashes or  significant lesions.  Significant collaterals are seen throughout her body EYES: normal, Conjunctiva are pink and non-injected, sclera clear OROPHARYNX:no exudate, no erythema and lips, buccal mucosa, and tongue normal  NECK: supple, thyroid normal size, non-tender, without nodularity LYMPH:  no palpable lymphadenopathy in the cervical, axillary or inguinal LUNGS: clear to auscultation and percussion with normal breathing effort HEART: regular rate & rhythm and no murmurs and no lower extremity edema ABDOMEN:abdomen soft, non-tender and normal bowel sounds.  There is significant abnormal appearance of the skin consistent with recent hematoma Musculoskeletal:no cyanosis of digits and no clubbing  NEURO: alert & oriented x 3 with dysarthria and weakness consistent with prior stroke Breast examination is performed which showed significant lymphedema over her right breast.  LABORATORY DATA:  I  have reviewed the data as listed    Component Value Date/Time   NA 144 06/05/2016 1359   K 3.8 06/05/2016 1359   CL 108 05/22/2016 0409   CO2 28 06/05/2016 1359   GLUCOSE 95 06/05/2016 1359   BUN 14.3 06/05/2016 1359   CREATININE 0.5 (L) 06/05/2016 1359   CALCIUM 9.1 06/05/2016 1359   PROT 6.6 01/26/2016 2115   ALBUMIN 3.4 (L) 01/26/2016 2115   AST 16 01/26/2016 2115   ALT 13 (L) 01/26/2016 2115   ALKPHOS 61 01/26/2016 2115   BILITOT 0.5 01/26/2016 2115   GFRNONAA >60 05/22/2016 0409   GFRAA >60 05/22/2016 0409    No results found for: SPEP, UPEP  Lab Results  Component Value Date   WBC 6.4 06/05/2016   NEUTROABS 5.0 06/05/2016   HGB 13.8 06/05/2016   HCT 42.8 06/05/2016   MCV 108.4 (H) 06/05/2016   PLT 501 (H) 06/05/2016      Chemistry      Component Value Date/Time   NA 144 06/05/2016 1359   K 3.8 06/05/2016 1359   CL 108 05/22/2016 0409   CO2 28 06/05/2016 1359   BUN 14.3 06/05/2016 1359   CREATININE 0.5 (L) 06/05/2016 1359      Component Value Date/Time   CALCIUM 9.1  06/05/2016 1359   ALKPHOS 61 01/26/2016 2115   AST 16 01/26/2016 2115   ALT 13 (L) 01/26/2016 2115   BILITOT 0.5 01/26/2016 2115       RADIOGRAPHIC STUDIES: I reviewed multiple imaging study with the patient and her daughter I have personally reviewed the radiological images as listed and agreed with the findings in the report. Ct Abdomen Pelvis W Contrast  Result Date: 05/18/2016 CLINICAL DATA:  Abdominal mass. Poor historian. Left lower leg swelling. EXAM: CT ABDOMEN AND PELVIS WITH CONTRAST TECHNIQUE: Multidetector CT imaging of the abdomen and pelvis was performed using the standard protocol following bolus administration of intravenous contrast. CONTRAST:  163mL ISOVUE-300 IOPAMIDOL (ISOVUE-300) INJECTION 61% COMPARISON:  CT of the abdomen and pelvis on 06/27/2015 in CT of the chest on 04/21/2016 FINDINGS: Lower chest: The heart is enlarged. Numerous left chest wall collaterals, consistent with superior vena cava compression from goiter. Pectus excavatum. Hepatobiliary: The liver is normal in appearance. A 1 cm gallstone is present. Pancreas: Unremarkable. No pancreatic ductal dilatation or surrounding inflammatory changes. Spleen: Normal in size without focal abnormality. Adrenals/Urinary Tract: Adrenal glands are unremarkable. Normal enhancement and excretion from both kidneys. There are bilateral parapelvic cysts. Stomach/Bowel: The stomach is normal in appearance. No small bowel dilatation or bowel wall thickening. Anterior abdominal wall hernia contains nondilated loops of small bowel. Large bowel loops contain a large amount of stool. Impacted stool identified within the rectosigmoid colon. Vascular/Lymphatic: There is atherosclerotic calcification of the abdominal aorta. No retroperitoneal or mesenteric adenopathy. Reproductive: Hysterectomy.  No adnexal mass. Other: Lobulated anterior abdominal wall mass is identified in the subcutaneous tissues extending from the left iliac crest, crossing  midline. Mass measures approximately 23 x 3.9 x 4.2 cm. The density favors hematoma. The skin of the right breast is markedly thickened, consistent with dependent edema and/or inflammatory malignancy. Musculoskeletal: Biconcave fracture of L3. Superior endplate fracture of L2, L1. Right hip arthroplasty. IMPRESSION: 1. Anterior abdominal wall mass is new since the previous exam. Given its size and density, hematoma is favored. Correlation with physical exam, ecchymosis, and history is recommended. 2. Rectosigmoid impaction. 3. Cardiomegaly. 4. Multiple venous collaterals, consistent with relative superior vena cava obstruction. Patient  is known to have a large goiter with displacement and effacement of superior vena cava. 5. Bilateral parapelvic renal cysts. 6. Anterior abdominal wall hernia containing nondilated loops of small bowel. 7. Prior hysterectomy. 8. Numerous vertebral fractures ; L2 fracture is new since June of 2017. 9. Abnormal right breast consistent with edema or malignancy. Diagnostic evaluation with mammogram and ultrasound recommended. Electronically Signed   By: Nolon Nations M.D.   On: 05/18/2016 22:34    ASSESSMENT & PLAN:  Essential thrombocytosis ([place]) She tolerated hydroxyurea well. She has missed several doses recently due to recurrent hospitalization Her platelet count is high I recommend adjusting the dose of hydroxyurea.  She will take 500 mg daily from Monday to Fridays and 1000 mg on Saturdays and Sundays I plan to see her back in 3 weeks with repeat blood work  DVT of lower extremity, bilateral ([place]) She has history of DVT and bilateral lower extremity edema I recommend she continues using elastic compression hose. Due to recent possibility of new DVT while on Eliquis, and also significant hematoma recently from Lovenox injection, Coumadin would be the safest choice at present time I explained to the daughter extensively the rationale of switching her back to  Coumadin   Discoloration of skin The patient has abnormal imaging over the right breast Her breast appears thickened She had extensive evaluation with mammogram and biopsy of the skin in the past without signs of cancer I suspect the abnormal skin appearance due to lymphedema from poor venous circulation on her chest wall from extensive blood clots in the past We will continue close monitoring  Hematoma of abdominal wall, sequela She has extensive hematoma of the abdominal wall from prior Lovenox injection It appears to be improving We will continue close monitoring for now   No orders of the defined types were placed in this encounter.  All questions were answered. The patient knows to call the clinic with any problems, questions or concerns. No barriers to learning was detected. I spent 25 minutes counseling the patient face to face. The total time spent in the appointment was 30 minutes and more than 50% was on counseling and review of test results     Heath Lark, MD 06/06/2016 8:58 AM

## 2016-06-06 NOTE — Assessment & Plan Note (Signed)
She has extensive hematoma of the abdominal wall from prior Lovenox injection It appears to be improving We will continue close monitoring for now

## 2016-06-06 NOTE — Assessment & Plan Note (Signed)
She tolerated hydroxyurea well. She has missed several doses recently due to recurrent hospitalization Her platelet count is high I recommend adjusting the dose of hydroxyurea.  She will take 500 mg daily from Monday to Fridays and 1000 mg on Saturdays and Sundays I plan to see her back in 3 weeks with repeat blood work

## 2016-06-08 ENCOUNTER — Encounter: Payer: Self-pay | Admitting: Internal Medicine

## 2016-06-08 DIAGNOSIS — D510 Vitamin B12 deficiency anemia due to intrinsic factor deficiency: Secondary | ICD-10-CM | POA: Diagnosis not present

## 2016-06-08 DIAGNOSIS — I69391 Dysphagia following cerebral infarction: Secondary | ICD-10-CM | POA: Diagnosis not present

## 2016-06-08 DIAGNOSIS — R350 Frequency of micturition: Secondary | ICD-10-CM | POA: Diagnosis not present

## 2016-06-08 DIAGNOSIS — I69351 Hemiplegia and hemiparesis following cerebral infarction affecting right dominant side: Secondary | ICD-10-CM | POA: Diagnosis not present

## 2016-06-08 DIAGNOSIS — I69320 Aphasia following cerebral infarction: Secondary | ICD-10-CM | POA: Diagnosis not present

## 2016-06-08 DIAGNOSIS — R1310 Dysphagia, unspecified: Secondary | ICD-10-CM | POA: Diagnosis not present

## 2016-06-08 DIAGNOSIS — I10 Essential (primary) hypertension: Secondary | ICD-10-CM | POA: Diagnosis not present

## 2016-06-09 DIAGNOSIS — D510 Vitamin B12 deficiency anemia due to intrinsic factor deficiency: Secondary | ICD-10-CM | POA: Diagnosis not present

## 2016-06-09 DIAGNOSIS — I69391 Dysphagia following cerebral infarction: Secondary | ICD-10-CM | POA: Diagnosis not present

## 2016-06-09 DIAGNOSIS — I10 Essential (primary) hypertension: Secondary | ICD-10-CM | POA: Diagnosis not present

## 2016-06-09 DIAGNOSIS — I69351 Hemiplegia and hemiparesis following cerebral infarction affecting right dominant side: Secondary | ICD-10-CM | POA: Diagnosis not present

## 2016-06-09 DIAGNOSIS — R1310 Dysphagia, unspecified: Secondary | ICD-10-CM | POA: Diagnosis not present

## 2016-06-09 DIAGNOSIS — I69320 Aphasia following cerebral infarction: Secondary | ICD-10-CM | POA: Diagnosis not present

## 2016-06-11 DIAGNOSIS — D510 Vitamin B12 deficiency anemia due to intrinsic factor deficiency: Secondary | ICD-10-CM | POA: Diagnosis not present

## 2016-06-11 DIAGNOSIS — R1310 Dysphagia, unspecified: Secondary | ICD-10-CM | POA: Diagnosis not present

## 2016-06-11 DIAGNOSIS — I10 Essential (primary) hypertension: Secondary | ICD-10-CM | POA: Diagnosis not present

## 2016-06-11 DIAGNOSIS — I69391 Dysphagia following cerebral infarction: Secondary | ICD-10-CM | POA: Diagnosis not present

## 2016-06-11 DIAGNOSIS — I69351 Hemiplegia and hemiparesis following cerebral infarction affecting right dominant side: Secondary | ICD-10-CM | POA: Diagnosis not present

## 2016-06-11 DIAGNOSIS — I69320 Aphasia following cerebral infarction: Secondary | ICD-10-CM | POA: Diagnosis not present

## 2016-06-13 ENCOUNTER — Telehealth: Payer: Self-pay | Admitting: Internal Medicine

## 2016-06-13 NOTE — Telephone Encounter (Signed)
Urine culture showed more than one bacteria - not a good sample.  If there are still concerns of an infection we should get another sample

## 2016-06-16 DIAGNOSIS — L89322 Pressure ulcer of left buttock, stage 2: Secondary | ICD-10-CM | POA: Diagnosis not present

## 2016-06-16 DIAGNOSIS — I10 Essential (primary) hypertension: Secondary | ICD-10-CM | POA: Diagnosis not present

## 2016-06-16 DIAGNOSIS — R1310 Dysphagia, unspecified: Secondary | ICD-10-CM | POA: Diagnosis not present

## 2016-06-16 DIAGNOSIS — I69320 Aphasia following cerebral infarction: Secondary | ICD-10-CM | POA: Diagnosis not present

## 2016-06-16 DIAGNOSIS — D510 Vitamin B12 deficiency anemia due to intrinsic factor deficiency: Secondary | ICD-10-CM | POA: Diagnosis not present

## 2016-06-16 DIAGNOSIS — M1990 Unspecified osteoarthritis, unspecified site: Secondary | ICD-10-CM | POA: Diagnosis not present

## 2016-06-16 DIAGNOSIS — Z7901 Long term (current) use of anticoagulants: Secondary | ICD-10-CM | POA: Diagnosis not present

## 2016-06-16 DIAGNOSIS — J690 Pneumonitis due to inhalation of food and vomit: Secondary | ICD-10-CM | POA: Diagnosis not present

## 2016-06-16 DIAGNOSIS — R1900 Intra-abdominal and pelvic swelling, mass and lump, unspecified site: Secondary | ICD-10-CM | POA: Diagnosis not present

## 2016-06-16 DIAGNOSIS — I82402 Acute embolism and thrombosis of unspecified deep veins of left lower extremity: Secondary | ICD-10-CM | POA: Diagnosis not present

## 2016-06-16 DIAGNOSIS — F4310 Post-traumatic stress disorder, unspecified: Secondary | ICD-10-CM | POA: Diagnosis not present

## 2016-06-16 DIAGNOSIS — H9190 Unspecified hearing loss, unspecified ear: Secondary | ICD-10-CM | POA: Diagnosis not present

## 2016-06-16 DIAGNOSIS — I69391 Dysphagia following cerebral infarction: Secondary | ICD-10-CM | POA: Diagnosis not present

## 2016-06-16 DIAGNOSIS — I69351 Hemiplegia and hemiparesis following cerebral infarction affecting right dominant side: Secondary | ICD-10-CM | POA: Diagnosis not present

## 2016-06-16 NOTE — Telephone Encounter (Signed)
Spoke with pts daughter. States she would like to have another specimen taken.

## 2016-06-17 ENCOUNTER — Ambulatory Visit (INDEPENDENT_AMBULATORY_CARE_PROVIDER_SITE_OTHER): Payer: Medicare Other | Admitting: Podiatry

## 2016-06-17 ENCOUNTER — Encounter: Payer: Self-pay | Admitting: Podiatry

## 2016-06-17 DIAGNOSIS — B351 Tinea unguium: Secondary | ICD-10-CM | POA: Diagnosis not present

## 2016-06-17 DIAGNOSIS — M79675 Pain in left toe(s): Secondary | ICD-10-CM | POA: Diagnosis not present

## 2016-06-17 DIAGNOSIS — M79674 Pain in right toe(s): Secondary | ICD-10-CM

## 2016-06-17 NOTE — Telephone Encounter (Signed)
Spoke with Janett Billow RN to give verbals for UA and UC

## 2016-06-17 NOTE — Patient Instructions (Signed)
Removed Band-Aid on the left great toe 1-3 days and continue to apply topical antibiotic ointment and a Band-Aid daily until a scab forms

## 2016-06-17 NOTE — Telephone Encounter (Signed)
LVM with Kassie with [facility] to call back so I could give verbal orders.

## 2016-06-18 DIAGNOSIS — L89322 Pressure ulcer of left buttock, stage 2: Secondary | ICD-10-CM | POA: Diagnosis not present

## 2016-06-18 DIAGNOSIS — I69351 Hemiplegia and hemiparesis following cerebral infarction affecting right dominant side: Secondary | ICD-10-CM | POA: Diagnosis not present

## 2016-06-18 DIAGNOSIS — I82402 Acute embolism and thrombosis of unspecified deep veins of left lower extremity: Secondary | ICD-10-CM | POA: Diagnosis not present

## 2016-06-18 DIAGNOSIS — J690 Pneumonitis due to inhalation of food and vomit: Secondary | ICD-10-CM | POA: Diagnosis not present

## 2016-06-18 DIAGNOSIS — I69391 Dysphagia following cerebral infarction: Secondary | ICD-10-CM | POA: Diagnosis not present

## 2016-06-18 DIAGNOSIS — R1310 Dysphagia, unspecified: Secondary | ICD-10-CM | POA: Diagnosis not present

## 2016-06-18 NOTE — Telephone Encounter (Signed)
Spoke with [facility] to inform MDs response.

## 2016-06-18 NOTE — Telephone Encounter (Signed)
Please advise 

## 2016-06-18 NOTE — Telephone Encounter (Signed)
No UA / UCx needed.  Thank you.

## 2016-06-18 NOTE — Progress Notes (Signed)
Patient ID: Sandy Barrett, female   DOB: 18-May-1932, 83 y.o.   MRN: [id number]    Subjective: This patient presents today with daughter present to treatment room complaining of thickened and uncomfortable hallux toenails left greater than right over an undetermined amount of time. The hallux nails primary left to become more uncomfortable and thickened when patient has direct shoe pressure over the toes Patient has history of stroke hemiparesis right side and is in a seated in a wheelchair and unable to transfer to treatment chair   Objective: Pleasant orientated 3 patient  Vascular: DP pulses 2/4 bilaterally PT pulses 1/4 bilaterally Capillary reflex immediate bilaterally  Dermatological: The hallux nails left greater right are deformed, discolored and hypertrophic The remaining toenails have texture and color changes with minimal deformity and palpable tenderness  Neurological: Ankle reflexes nonreactive right weekly reactive left Sensation to 10 g monofilament wire 0/5 right and 4/5 left  Musculoskeletal: Dorsi flexion and plantar flexion right 0/5 Dorsi flexion and plantar flexion left 5/5  Assessment: Stroke related right lower extremity weakness Symptomatic onychomycoses 6-10 with maximum symptoms and deformity in the hallux toenails   Plan:  Debridement of toenails 6-10 mechanically and electrically with slight bleeding distal left hallux to with topical antibiotic ointment and Band-Aid. Patient instructed remove the Band-Aid and 1-3 days and apply topical antibiotic ointment and a Band-Aid daily until a scab forms  Reappoint 3 months

## 2016-06-18 NOTE — Telephone Encounter (Signed)
[place] Nurse does not think that a UA is necessary. The daughter is wanting it done because she says that her urine has an odor. Jana Half said that she does not smell or see anything that would cause concern for an infection. If Dr Quay Burow thinks that it would not be necessary they will wait but if she would like the UA they can go out tomorrow and do it.

## 2016-06-26 DIAGNOSIS — I69351 Hemiplegia and hemiparesis following cerebral infarction affecting right dominant side: Secondary | ICD-10-CM | POA: Diagnosis not present

## 2016-06-26 DIAGNOSIS — I82402 Acute embolism and thrombosis of unspecified deep veins of left lower extremity: Secondary | ICD-10-CM | POA: Diagnosis not present

## 2016-06-26 DIAGNOSIS — I69391 Dysphagia following cerebral infarction: Secondary | ICD-10-CM | POA: Diagnosis not present

## 2016-06-26 DIAGNOSIS — J690 Pneumonitis due to inhalation of food and vomit: Secondary | ICD-10-CM | POA: Diagnosis not present

## 2016-06-26 DIAGNOSIS — L89322 Pressure ulcer of left buttock, stage 2: Secondary | ICD-10-CM | POA: Diagnosis not present

## 2016-06-26 DIAGNOSIS — R1310 Dysphagia, unspecified: Secondary | ICD-10-CM | POA: Diagnosis not present

## 2016-06-30 ENCOUNTER — Ambulatory Visit (HOSPITAL_BASED_OUTPATIENT_CLINIC_OR_DEPARTMENT_OTHER): Payer: Medicare Other

## 2016-06-30 ENCOUNTER — Ambulatory Visit: Payer: Medicare Other | Admitting: Internal Medicine

## 2016-06-30 ENCOUNTER — Encounter: Payer: Self-pay | Admitting: Hematology and Oncology

## 2016-06-30 ENCOUNTER — Ambulatory Visit (HOSPITAL_BASED_OUTPATIENT_CLINIC_OR_DEPARTMENT_OTHER): Payer: Medicare Other | Admitting: Hematology and Oncology

## 2016-06-30 DIAGNOSIS — Z86711 Personal history of pulmonary embolism: Secondary | ICD-10-CM | POA: Diagnosis not present

## 2016-06-30 DIAGNOSIS — L89322 Pressure ulcer of left buttock, stage 2: Secondary | ICD-10-CM | POA: Diagnosis not present

## 2016-06-30 DIAGNOSIS — R1310 Dysphagia, unspecified: Secondary | ICD-10-CM | POA: Diagnosis not present

## 2016-06-30 DIAGNOSIS — D473 Essential (hemorrhagic) thrombocythemia: Secondary | ICD-10-CM

## 2016-06-30 DIAGNOSIS — B372 Candidiasis of skin and nail: Secondary | ICD-10-CM | POA: Diagnosis not present

## 2016-06-30 DIAGNOSIS — I82402 Acute embolism and thrombosis of unspecified deep veins of left lower extremity: Secondary | ICD-10-CM | POA: Diagnosis not present

## 2016-06-30 DIAGNOSIS — I69391 Dysphagia following cerebral infarction: Secondary | ICD-10-CM | POA: Diagnosis not present

## 2016-06-30 DIAGNOSIS — I69351 Hemiplegia and hemiparesis following cerebral infarction affecting right dominant side: Secondary | ICD-10-CM | POA: Diagnosis not present

## 2016-06-30 DIAGNOSIS — J690 Pneumonitis due to inhalation of food and vomit: Secondary | ICD-10-CM | POA: Diagnosis not present

## 2016-06-30 LAB — CBC WITH DIFFERENTIAL/PLATELET
BASO%: 0.9 % (ref 0.0–2.0)
Basophils Absolute: 0.1 10*3/uL (ref 0.0–0.1)
EOS%: 3.5 % (ref 0.0–7.0)
Eosinophils Absolute: 0.2 10*3/uL (ref 0.0–0.5)
HCT: 44 % (ref 34.8–46.6)
HGB: 13.8 g/dL (ref 11.6–15.9)
LYMPH%: 23.9 % (ref 14.0–49.7)
MCH: 33.8 pg (ref 25.1–34.0)
MCHC: 31.4 g/dL — ABNORMAL LOW (ref 31.5–36.0)
MCV: 107.8 fL — ABNORMAL HIGH (ref 79.5–101.0)
MONO#: 0.5 10*3/uL (ref 0.1–0.9)
MONO%: 9.3 % (ref 0.0–14.0)
NEUT#: 3.4 10*3/uL (ref 1.5–6.5)
NEUT%: 62.4 % (ref 38.4–76.8)
Platelets: ADEQUATE 10*3/uL (ref 145–400)
RBC: 4.08 10*6/uL (ref 3.70–5.45)
RDW: 16 % — ABNORMAL HIGH (ref 11.2–14.5)
WBC: 5.5 10*3/uL (ref 3.9–10.3)
lymph#: 1.3 10*3/uL (ref 0.9–3.3)
nRBC: 0 % (ref 0–0)

## 2016-06-30 LAB — BASIC METABOLIC PANEL
Anion Gap: 10 mEq/L (ref 3–11)
BUN: 11.5 mg/dL (ref 7.0–26.0)
CO2: 26 mEq/L (ref 22–29)
Calcium: 9.3 mg/dL (ref 8.4–10.4)
Chloride: 107 mEq/L (ref 98–109)
Creatinine: 0.5 mg/dL — ABNORMAL LOW (ref 0.6–1.1)
EGFR: >90 mL/min/{1.73_m2} (ref >90)
Glucose: 88 mg/dl (ref 70–140)
Potassium: 4.3 mEq/L (ref 3.5–5.1)
Sodium: 144 mEq/L (ref 136–145)

## 2016-06-30 MED ORDER — NYSTATIN 100000 UNIT/GM EX POWD: Freq: Three times a day (TID) | CUTANEOUS | 0 refills | Status: DC

## 2016-07-01 DIAGNOSIS — B372 Candidiasis of skin and nail: Secondary | ICD-10-CM | POA: Insufficient documentation

## 2016-07-01 NOTE — Assessment & Plan Note (Signed)
The goal is strictly for secondary prevention She tolerated anticoagulation therapy well without bleeding. She will continue INR monitoring through her primary care doctor.

## 2016-07-01 NOTE — Progress Notes (Signed)
[facility] OFF[facility] PROGRESS NOTE  Patient Care Team: [name], MD as PCP - General (Internal Medicine) Eunice Blase, MD (Inactive) as Consulting Physician (Family Medicine) Renato Shin, MD as Consulting Physician (Endocrinology) Netta Cedars, MD as Consulting Physician (Orthopedic Surgery) Lyndal Pulley, DO (Sports Medicine) Garvin Fila, MD (Neurology)  SUMMARY OF ONCOLOGIC HISTORY:   Essential thrombocytosis ([place])   08/07/2013 Miscellaneous    The patient is noted to have elevated platelet count      03/08/2014 Imaging    Positive for acute PE with CT evidence of right heart strain (RV/LV Ratio = 0.9) consistent with at least submassive (intermediate risk)PE.       03/09/2014 Imaging    US venous Doppler showed deep vein thrombosis noted in the right distal common femoral vein, femoral vein, and popliteal vein. DVT noted in the left femoral and popliteal veins      03/09/2014 Imaging    Patchy areas of acute left MCA territory infarction. 2. Several punctate foci of acute right MCA territory infarction. Possible trace subarachnoid hemorrhage in the high right frontal lobe. 3. Occluded left ICA and left MCA      03/20/2015 Imaging    Compared to MRI on 03/09/14, there has been expected evolutional change of left MCA infarction. In addition, there may be a few areas of acute-subacute infarcts in the left basal ganglia vs artifact.        07/31/2015 Pathology Results    Peripheral blood is positive for JAK2 mutation      07/31/2015 -  Chemotherapy    She is started on 500 mg daily Hydrea      08/15/2015 Miscellaneous    The dose of hydroxyurea is increased to 1000 mg daily      09/26/2015 Miscellaneous    Dose of Hydrea to 500 mg daily except on Saturdays and Sundays she takes 1000 mg.      10 /20/2017 Adverse Reaction    Dose of Hydrea is reduced to 500 mg daily        INTERVAL HISTORY: Please see below for problem oriented charting. She  returns for further follow-up Due to her at this aphasia, her daughter serves as interpreter She is able to tolerate modified dose of hydroxyurea well Denies recent infection The patient denies any recent signs or symptoms of bleeding such as spontaneous epistaxis, hematuria or hematochezia. Her daughter noticed some skin infection on the skin folds and breast.  REVIEW OF SYSTEMS:   Constitutional: Denies fevers, chills or abnormal weight loss Eyes: Denies blurriness of vision Ears, nose, mouth, throat, and face: Denies mucositis or sore throat Respiratory: Denies cough, dyspnea or wheezes Cardiovascular: Denies palpitation, chest discomfort or lower extremity swelling Gastrointestinal:  Denies nausea, heartburn or change in bowel habits Lymphatics: Denies new lymphadenopathy or easy bruising Neurological:Denies numbness, tingling or new weaknesses Behavioral/Psych: Mood is stable, no new changes  All other systems were reviewed with the patient and are negative.  I have reviewed the past medical history, past surgical history, social history and family history with the patient and they are unchanged from previous note.  ALLERGIES:  has No Known Allergies.  MEDICATIONS:  Current Outpatient Prescriptions  Medication Sig Dispense Refill  . acetaminophen (TYLENOL) 160 MG/5ML liquid Take 480 mg by mouth every 4 (four) hours as needed for fever or pain.    [name] albuterol (PROVENTIL HFA) 108 (90 Base) MCG/ACT inhaler Inhale 2 puffs into the lungs every 6 (six) hours as needed  for wheezing or shortness of breath. 1 Inhaler 1  . diclofenac sodium (VOLTAREN) 1 % GEL APPLY (2GMS) TOPICALLY THREE TIMES DAILY. 300 g 5  . gabapentin (NEURONTIN) 100 MG capsule Take 1 capsule (100 mg total) by mouth at bedtime.    [name] guaiFENesin (MUCINEX) 600 MG 12 hr tablet Take 1 tablet (600 mg total) by mouth 2 (two) times daily. (Patient taking differently: Take 600 mg by mouth 2 (two) times daily as needed for cough  or to loosen phlegm. ) 10 tablet 0  . hydroxyurea (HYDREA) 500 MG capsule Take 2 capsules (1,000 mg total) by mouth daily. May take with food to minimize GI side effects. (Patient taking differently: Take 500 mg by mouth daily. ) 60 capsule 9  . ipratropium-albuterol (DUONEB) 0.5-2.5 (3) MG/3ML SOLN Take 3 mLs by nebulization every 6 (six) hours as needed. (Patient taking differently: Take 3 mLs by nebulization every 6 (six) hours as needed (shortness of breath). ) 360 mL 0  . methimazole (TAPAZOLE) 5 MG tablet TAKE 1 TABLET BY MOUTH TWICE WEEKLY (Patient taking differently: TAKE 1 TABLET BY MOUTH TWICE WEEKLY (Sun and Thurs)) 12 tablet 2  . nystatin (MYCOSTATIN/NYSTOP) powder Apply topically 3 (three) times daily. 30 g 0  . polyethylene glycol (MIRALAX / GLYCOLAX) packet TAKE 17G BY MOUTH TWICE A DAY (Patient taking differently: TAKE 17G BY MOUTH TWICE A DAY AS NEED FOR CONSTIPATION) 14 packet 1  . Probiotic Product (ALIGN) 4 MG CAPS Take 1 capsule by mouth daily.     . propranolol (INDERAL) 10 MG tablet Take 0.5 tablets (5 mg total) by mouth 2 (two) times daily. 60 tablet 5  . PROTONIX 40 MG PACK TAKE 20 MLS (40 MG TOTAL) BY MOUTH DAILY. (Patient taking differently: Take 40 mg by mouth daily. Mix with applesauce) 30 each 5  . terconazole (TERAZOL 7) 0.4 % vaginal cream Place 1 applicator vaginally at bedtime. (Patient taking differently: Place 1 applicator vaginally at bedtime as needed (irritation). ) 90 g 3  . traMADol (ULTRAM) 50 MG tablet TAKE 2 TABLETS BY MOUTH 3 TIMES A DAY AS NEEDED (Patient taking differently: Take 100 mg by mouth 3 (three) times daily as needed for moderate pain. ) 180 tablet 1  . traZODone (DESYREL) 50 MG tablet TAKE 1 TABLET (50 MG TOTAL) BY MOUTH AT BEDTIME AS NEEDED. FOR SLEEP (Patient taking differently: Take 50 mg by mouth at bedtime. for sleep) 30 tablet 5  . triamcinolone ointment (KENALOG) 0.5 % Apply 1 application topically 2 (two) times daily. 30 g 0  . warfarin  (COUMADIN) 2 MG tablet Take 2 tablets (4 mg total) by mouth one time only at 6 PM. 60 tablet 1   No current facility-administered medications for this visit.     PHYSICAL EXAMINATION: ECOG PERFORMANCE STATUS: 2 - Symptomatic, <50% confined to bed GENERAL:alert, no distress and comfortable SKIN: Noticed signs of yeast infection under skin folds especially under her breast  EYES: normal, Conjunctiva are pink and non-injected, sclera clear OROPHARYNX:no exudate, no erythema and lips, buccal mucosa, and tongue normal  NECK: supple, thyroid normal size, non-tender, without nodularity LYMPH:  no palpable lymphadenopathy in the cervical, axillary or inguinal LUNGS: clear to auscultation and percussion with normal breathing effort HEART: regular rate & rhythm and no murmurs and no lower extremity edema ABDOMEN:abdomen soft, non-tender and normal bowel sounds Musculoskeletal:no cyanosis of digits and no clubbing  NEURO: alert & oriented x 3 with dysarthria.  She is wheelchair-bound  LABORATORY DATA:  I have reviewed the data as listed    Component Value Date/Time   NA 144 06/30/2016 1607   K 4.3 06/30/2016 1607   CL 108 05/22/2016 0409   CO2 26 06/30/2016 1607   GLUCOSE 88 06/30/2016 1607   BUN 11.5 06/30/2016 1607   CREATININE 0.5 (L) 06/30/2016 1607   CALCIUM 9.3 06/30/2016 1607   PROT 6.6 01/26/2016 2115   ALBUMIN 3.4 (L) 01/26/2016 2115   AST 16 01/26/2016 2115   ALT 13 (L) 01/26/2016 2115   ALKPHOS 61 01/26/2016 2115   BILITOT 0.5 01/26/2016 2115   GFRNONAA >60 05/22/2016 0409   GFRAA >60 05/22/2016 0409    No results found for: SPEP, UPEP  Lab Results  Component Value Date   WBC 5.5 06/30/2016   NEUTROABS 3.4 06/30/2016   HGB 13.8 06/30/2016   HCT 44.0 06/30/2016   MCV 107.8 (H) 06/30/2016   PLT Clumped Platelets--Appears Adequate 06/30/2016      Chemistry      Component Value Date/Time   NA 144 06/30/2016 1607   K 4.3 06/30/2016 1607   CL 108 05/22/2016 0409    CO2 26 06/30/2016 1607   BUN 11.5 06/30/2016 1607   CREATININE 0.5 (L) 06/30/2016 1607      Component Value Date/Time   CALCIUM 9.3 06/30/2016 1607   ALKPHOS 61 01/26/2016 2115   AST 16 01/26/2016 2115   ALT 13 (L) 01/26/2016 2115   BILITOT 0.5 01/26/2016 2115       ASSESSMENT & PLAN:  Essential thrombocytosis ([place]) She tolerated hydroxyurea well. Recently, I adjusted the dose of hydroxyurea.  She will take 500 mg daily from Monday to Fridays and 1000 mg on Saturdays and Sundays Her blood counts are stable.  I recommend we continue the same. She has multiple appointment to see various different doctors in the next few weeks. It would be helpful if she can get CBC done and her other doctor's office so that would reduce additional workup here.   History of pulmonary embolism The goal is strictly for secondary prevention She tolerated anticoagulation therapy well without bleeding. She will continue INR monitoring through her primary care doctor.  Yeast infection of the skin She has a yeast infection under the skin I recommend topical nystatin for her. We also discussed strategies to keep her skin dry   No orders of the defined types were placed in this encounter.  All questions were answered. The patient knows to call the clinic with any problems, questions or concerns. No barriers to learning was detected. I spent 15 minutes counseling the patient face to face. The total time spent in the appointment was 20 minutes and more than 50% was on counseling and review of test results     Heath Lark, MD 07/01/2016 9:27 AM

## 2016-07-01 NOTE — Assessment & Plan Note (Signed)
She tolerated hydroxyurea well. Recently, I adjusted the dose of hydroxyurea.  She will take 500 mg daily from Monday to Fridays and 1000 mg on Saturdays and Sundays Her blood counts are stable.  I recommend we continue the same. She has multiple appointment to see various different doctors in the next few weeks. It would be helpful if she can get CBC done and her other doctor's office so that would reduce additional workup here.

## 2016-07-01 NOTE — Assessment & Plan Note (Signed)
She has a yeast infection under the skin I recommend topical nystatin for her. We also discussed strategies to keep her skin dry

## 2016-07-02 DIAGNOSIS — H9190 Unspecified hearing loss, unspecified ear: Secondary | ICD-10-CM | POA: Diagnosis not present

## 2016-07-02 DIAGNOSIS — J690 Pneumonitis due to inhalation of food and vomit: Secondary | ICD-10-CM | POA: Diagnosis not present

## 2016-07-02 DIAGNOSIS — I69320 Aphasia following cerebral infarction: Secondary | ICD-10-CM | POA: Diagnosis not present

## 2016-07-02 DIAGNOSIS — R1310 Dysphagia, unspecified: Secondary | ICD-10-CM | POA: Diagnosis not present

## 2016-07-02 DIAGNOSIS — D510 Vitamin B12 deficiency anemia due to intrinsic factor deficiency: Secondary | ICD-10-CM | POA: Diagnosis not present

## 2016-07-02 DIAGNOSIS — I69391 Dysphagia following cerebral infarction: Secondary | ICD-10-CM | POA: Diagnosis not present

## 2016-07-02 DIAGNOSIS — I69351 Hemiplegia and hemiparesis following cerebral infarction affecting right dominant side: Secondary | ICD-10-CM | POA: Diagnosis not present

## 2016-07-02 DIAGNOSIS — I10 Essential (primary) hypertension: Secondary | ICD-10-CM | POA: Diagnosis not present

## 2016-07-02 DIAGNOSIS — M1990 Unspecified osteoarthritis, unspecified site: Secondary | ICD-10-CM | POA: Diagnosis not present

## 2016-07-02 DIAGNOSIS — L89322 Pressure ulcer of left buttock, stage 2: Secondary | ICD-10-CM | POA: Diagnosis not present

## 2016-07-02 DIAGNOSIS — I82402 Acute embolism and thrombosis of unspecified deep veins of left lower extremity: Secondary | ICD-10-CM | POA: Diagnosis not present

## 2016-07-02 DIAGNOSIS — F4310 Post-traumatic stress disorder, unspecified: Secondary | ICD-10-CM | POA: Diagnosis not present

## 2016-07-03 ENCOUNTER — Encounter: Payer: Self-pay | Admitting: Physical Medicine & Rehabilitation

## 2016-07-03 ENCOUNTER — Encounter: Payer: Medicare Other | Attending: Physical Medicine & Rehabilitation

## 2016-07-03 ENCOUNTER — Telehealth: Payer: Self-pay | Admitting: Internal Medicine

## 2016-07-03 ENCOUNTER — Ambulatory Visit (HOSPITAL_BASED_OUTPATIENT_CLINIC_OR_DEPARTMENT_OTHER): Payer: Medicare Other | Admitting: Physical Medicine & Rehabilitation

## 2016-07-03 VITALS — BP 110/70 | HR 79

## 2016-07-03 DIAGNOSIS — G8110 Spastic hemiplegia affecting unspecified side: Secondary | ICD-10-CM

## 2016-07-03 MED ORDER — GABAPENTIN 100 MG PO CAPS: 100.0000 mg | ORAL_CAPSULE | Freq: Two times a day (BID) | ORAL | 2 refills | Status: DC

## 2016-07-03 NOTE — Telephone Encounter (Signed)
Spoke with Anderson Malta to give verbal orders per MD

## 2016-07-03 NOTE — Progress Notes (Signed)
Dysport Injection for spasticity using needle EMG guidance  Preinjection pronator Ashworth 3, FPL Ashworth 3, FDS Ashworth 2, FDP Ashworth 2, wrist flexor Ashworth 2 Dilution: 200 Units/ml Indication: Severe spasticity which interferes with ADL,mobility and/or  hygiene and is unresponsive to medication management and other conservative care Informed consent was obtained after describing risks and benefits of the procedure with the patient. This includes bleeding, bruising, infection, excessive weakness, or medication side effects. A REMS form is on file and signed. Needle: 25 g 2" needle electrode Number of units per muscle Pronator teres, 200 FCU 100 FDP 200 FDS 200 FCR 100 FPL 888 Opponents pollicis 757  Reduced EMG activity with exception of pronator teres   All injections were done after obtaining appropriate EMG activity and after negative drawback for blood. The patient tolerated the procedure well. Post procedure instructions were given. A followup appointment was made.   Plan for next injection Pronator teres 200 FDP 100 FDS 972 Opponents pollicis 820

## 2016-07-03 NOTE — Telephone Encounter (Signed)
agree

## 2016-07-03 NOTE — Patient Instructions (Addendum)
You received a Dysport injection today. You may experience muscle pains and aches. He may apply ice 20 minutes every 2 hours as needed for the next 24-48 hours. He also noticed bleeding or bruising in the areas that were injected. May apply Band-Aid. If this bruising is extensive, please notify our office. If there is evidence of increasing redness that occurs 2-3 days after injection. Please call our office. This could be a sign of infection. It is very rare, however. You may experience some muscle weakness in the muscles and injected. This would likely start in about one week.  Do not think sequential compression devices will help prevent any further DVTs unless she comes off of blood thinners.

## 2016-07-03 NOTE — Telephone Encounter (Signed)
[facility] from [facility] [phone] ext [phone]  Pt daughter would like orders put in for a urinalysis, there is an odor coming from the patients urine. Pt currently has a catheter. Please advise

## 2016-07-06 ENCOUNTER — Encounter: Payer: Self-pay | Admitting: Nurse Practitioner

## 2016-07-08 ENCOUNTER — Encounter: Payer: Self-pay | Admitting: Internal Medicine

## 2016-07-08 ENCOUNTER — Other Ambulatory Visit
Admission: RE | Admit: 2016-07-08 | Discharge: 2016-07-08 | Disposition: A | Discharge status: Home / Self Care | Payer: Medicare Other | Source: Other Acute Inpatient Hospital | Attending: Internal Medicine | Admitting: Internal Medicine

## 2016-07-08 DIAGNOSIS — R1310 Dysphagia, unspecified: Secondary | ICD-10-CM | POA: Diagnosis not present

## 2016-07-08 DIAGNOSIS — I69391 Dysphagia following cerebral infarction: Secondary | ICD-10-CM | POA: Diagnosis not present

## 2016-07-08 DIAGNOSIS — N390 Urinary tract infection, site not specified: Secondary | ICD-10-CM | POA: Insufficient documentation

## 2016-07-08 DIAGNOSIS — J690 Pneumonitis due to inhalation of food and vomit: Secondary | ICD-10-CM | POA: Diagnosis not present

## 2016-07-08 DIAGNOSIS — L89322 Pressure ulcer of left buttock, stage 2: Secondary | ICD-10-CM | POA: Diagnosis not present

## 2016-07-08 DIAGNOSIS — I82402 Acute embolism and thrombosis of unspecified deep veins of left lower extremity: Secondary | ICD-10-CM | POA: Diagnosis not present

## 2016-07-08 DIAGNOSIS — I69351 Hemiplegia and hemiparesis following cerebral infarction affecting right dominant side: Secondary | ICD-10-CM | POA: Diagnosis not present

## 2016-07-08 LAB — URINALYSIS, COMPLETE (UACMP) WITH MICROSCOPIC
Bilirubin Urine: NEGATIVE
Glucose, UA: NEGATIVE mg/dL
Ketones, ur: NEGATIVE mg/dL
Nitrite: POSITIVE — AB
Protein, ur: 30 mg/dL — AB
Specific Gravity, Urine: 1.021 (ref 1.005–1.030)
Squamous Epithelial / LPF: NONE SEEN
pH: 5 (ref 5.0–8.0)

## 2016-07-10 ENCOUNTER — Telehealth: Payer: Self-pay | Admitting: Internal Medicine

## 2016-07-10 MED ORDER — NITROFURANTOIN MONOHYD MACRO 100 MG PO CAPS: 100.0000 mg | ORAL_CAPSULE | Freq: Two times a day (BID) | ORAL | 0 refills | Status: DC

## 2016-07-10 NOTE — Telephone Encounter (Signed)
Urine does look like there is an infection -- an antibiotic was sent to her [facility]

## 2016-07-10 NOTE — Telephone Encounter (Signed)
Pt informed

## 2016-07-13 ENCOUNTER — Telehealth: Payer: Self-pay | Admitting: Internal Medicine

## 2016-07-13 MED ORDER — AMOXICILLIN-POT CLAVULANATE 875-125 MG PO TABS: 1.0000 | ORAL_TABLET | Freq: Two times a day (BID) | ORAL | 0 refills | Status: DC

## 2016-07-13 NOTE — Telephone Encounter (Signed)
The antibiotic I prescribed is not going to cover the UTI - stop the medication.   Start augmentin  - sent to [facility].

## 2016-07-13 NOTE — Telephone Encounter (Signed)
Spoke with pts daughter to inform.  

## 2016-07-14 DIAGNOSIS — S301XXA Contusion of abdominal wall, initial encounter: Secondary | ICD-10-CM | POA: Diagnosis not present

## 2016-07-15 ENCOUNTER — Telehealth: Payer: Self-pay | Admitting: Internal Medicine

## 2016-07-15 DIAGNOSIS — J690 Pneumonitis due to inhalation of food and vomit: Secondary | ICD-10-CM | POA: Diagnosis not present

## 2016-07-15 DIAGNOSIS — R1310 Dysphagia, unspecified: Secondary | ICD-10-CM | POA: Diagnosis not present

## 2016-07-15 DIAGNOSIS — L304 Erythema intertrigo: Secondary | ICD-10-CM | POA: Diagnosis not present

## 2016-07-15 DIAGNOSIS — I82402 Acute embolism and thrombosis of unspecified deep veins of left lower extremity: Secondary | ICD-10-CM | POA: Diagnosis not present

## 2016-07-15 DIAGNOSIS — I69351 Hemiplegia and hemiparesis following cerebral infarction affecting right dominant side: Secondary | ICD-10-CM | POA: Diagnosis not present

## 2016-07-15 DIAGNOSIS — L89322 Pressure ulcer of left buttock, stage 2: Secondary | ICD-10-CM | POA: Diagnosis not present

## 2016-07-15 DIAGNOSIS — I69391 Dysphagia following cerebral infarction: Secondary | ICD-10-CM | POA: Diagnosis not present

## 2016-07-15 NOTE — Telephone Encounter (Signed)
FYI  Pt O2 stat was 91% today  Thre were crackles in the lower lobes  No shortness of breath

## 2016-07-15 NOTE — Telephone Encounter (Signed)
If she develops shortness of breath, increased swelling or crackles persist she should be evaluated - may need a chest xray.

## 2016-07-16 DIAGNOSIS — I69351 Hemiplegia and hemiparesis following cerebral infarction affecting right dominant side: Secondary | ICD-10-CM | POA: Diagnosis not present

## 2016-07-16 DIAGNOSIS — L89322 Pressure ulcer of left buttock, stage 2: Secondary | ICD-10-CM | POA: Diagnosis not present

## 2016-07-16 DIAGNOSIS — I82402 Acute embolism and thrombosis of unspecified deep veins of left lower extremity: Secondary | ICD-10-CM | POA: Diagnosis not present

## 2016-07-16 DIAGNOSIS — J690 Pneumonitis due to inhalation of food and vomit: Secondary | ICD-10-CM | POA: Diagnosis not present

## 2016-07-16 DIAGNOSIS — I69391 Dysphagia following cerebral infarction: Secondary | ICD-10-CM | POA: Diagnosis not present

## 2016-07-16 DIAGNOSIS — R1310 Dysphagia, unspecified: Secondary | ICD-10-CM | POA: Diagnosis not present

## 2016-07-16 NOTE — Telephone Encounter (Signed)
Spoke with Sandy Barrett to inform. She will see pt next week to re- access.

## 2016-07-19 ENCOUNTER — Other Ambulatory Visit: Payer: Self-pay | Admitting: Internal Medicine

## 2016-07-20 NOTE — Telephone Encounter (Signed)
[place] Controlled Substance Database checked. Okay to fill RX. Last filled 06/13/16

## 2016-07-20 NOTE — Telephone Encounter (Signed)
RX faxed to local [facility] 

## 2016-07-22 ENCOUNTER — Telehealth: Payer: Self-pay | Admitting: Internal Medicine

## 2016-07-22 DIAGNOSIS — I69351 Hemiplegia and hemiparesis following cerebral infarction affecting right dominant side: Secondary | ICD-10-CM | POA: Diagnosis not present

## 2016-07-22 DIAGNOSIS — R1310 Dysphagia, unspecified: Secondary | ICD-10-CM | POA: Diagnosis not present

## 2016-07-22 DIAGNOSIS — L89322 Pressure ulcer of left buttock, stage 2: Secondary | ICD-10-CM | POA: Diagnosis not present

## 2016-07-22 DIAGNOSIS — I82402 Acute embolism and thrombosis of unspecified deep veins of left lower extremity: Secondary | ICD-10-CM | POA: Diagnosis not present

## 2016-07-22 DIAGNOSIS — I69391 Dysphagia following cerebral infarction: Secondary | ICD-10-CM | POA: Diagnosis not present

## 2016-07-22 DIAGNOSIS — J690 Pneumonitis due to inhalation of food and vomit: Secondary | ICD-10-CM | POA: Diagnosis not present

## 2016-07-22 NOTE — Telephone Encounter (Signed)
Noted.  Her oxygen saturation is typically in the low 90's - that is ok

## 2016-07-22 NOTE — Telephone Encounter (Signed)
Natlia from [facility] called in and said that all vitals look good but oxgen saturation is at 91%   [phone]

## 2016-07-23 ENCOUNTER — Ambulatory Visit (INDEPENDENT_AMBULATORY_CARE_PROVIDER_SITE_OTHER): Payer: Medicare Other | Admitting: General Practice

## 2016-07-23 DIAGNOSIS — Z86711 Personal history of pulmonary embolism: Secondary | ICD-10-CM

## 2016-07-23 LAB — POCT INR: INR: 4.5

## 2016-07-23 NOTE — Telephone Encounter (Signed)
LVM with Natlia informing of MDs note.

## 2016-07-23 NOTE — Patient Instructions (Signed)
Pre visit review using our clinic review tool, if applicable. No additional management support is needed unless otherwise documented below in the visit note. 

## 2016-07-23 NOTE — Progress Notes (Signed)
I have reviewed and agree with the plan. 

## 2016-07-29 DIAGNOSIS — I69351 Hemiplegia and hemiparesis following cerebral infarction affecting right dominant side: Secondary | ICD-10-CM | POA: Diagnosis not present

## 2016-07-29 DIAGNOSIS — L89322 Pressure ulcer of left buttock, stage 2: Secondary | ICD-10-CM | POA: Diagnosis not present

## 2016-07-29 DIAGNOSIS — I69391 Dysphagia following cerebral infarction: Secondary | ICD-10-CM | POA: Diagnosis not present

## 2016-07-29 DIAGNOSIS — I82402 Acute embolism and thrombosis of unspecified deep veins of left lower extremity: Secondary | ICD-10-CM | POA: Diagnosis not present

## 2016-07-29 DIAGNOSIS — R1310 Dysphagia, unspecified: Secondary | ICD-10-CM | POA: Diagnosis not present

## 2016-07-29 DIAGNOSIS — J690 Pneumonitis due to inhalation of food and vomit: Secondary | ICD-10-CM | POA: Diagnosis not present

## 2016-07-31 DIAGNOSIS — R4189 Other symptoms and signs involving cognitive functions and awareness: Secondary | ICD-10-CM | POA: Diagnosis not present

## 2016-07-31 DIAGNOSIS — H6122 Impacted cerumen, left ear: Secondary | ICD-10-CM | POA: Diagnosis not present

## 2016-08-03 ENCOUNTER — Encounter: Payer: Self-pay | Admitting: Internal Medicine

## 2016-08-03 ENCOUNTER — Ambulatory Visit (INDEPENDENT_AMBULATORY_CARE_PROVIDER_SITE_OTHER): Payer: Medicare Other | Admitting: Internal Medicine

## 2016-08-03 ENCOUNTER — Telehealth: Payer: Self-pay | Admitting: Emergency Medicine

## 2016-08-03 VITALS — BP 120/68 | HR 86

## 2016-08-03 DIAGNOSIS — E042 Nontoxic multinodular goiter: Secondary | ICD-10-CM | POA: Diagnosis not present

## 2016-08-03 DIAGNOSIS — E0590 Thyrotoxicosis, unspecified without thyrotoxic crisis or storm: Secondary | ICD-10-CM

## 2016-08-03 LAB — CBC
HCT: 44.4 % (ref 36.0–46.0)
Hemoglobin: 14 g/dL (ref 12.0–15.0)
MCHC: 31.5 g/dL (ref 30.0–36.0)
MCV: 108.5 fl — ABNORMAL HIGH (ref 78.0–100.0)
Platelets: 450 10*3/uL — ABNORMAL HIGH (ref 150.0–400.0)
RBC: 4.09 Mil/uL (ref 3.87–5.11)
RDW: 17.9 % — ABNORMAL HIGH (ref 11.5–15.5)
WBC: 4.9 10*3/uL (ref 4.0–10.5)

## 2016-08-03 LAB — TSH: TSH: 1.15 u[IU]/mL (ref 0.35–4.50)

## 2016-08-03 LAB — T3, FREE: T3, Free: 2.8 pg/mL (ref 2.3–4.2)

## 2016-08-03 LAB — T4, FREE: Free T4: 1.13 ng/dL (ref 0.60–1.60)

## 2016-08-03 NOTE — Patient Instructions (Signed)
Please continue 5 mg Methimazole 2x a week.  I referred you to see Dr. Harlow Asa.  Please stop at the lab.  Please come back for a follow-up appointment in 3 months.

## 2016-08-03 NOTE — Telephone Encounter (Signed)
Received call from pts daughter. Pt has has congestion and cough xs 4 days. Currently using breathing treatment, mucinex, and OTC cough med. Is there anything else that should be done. Please advise.

## 2016-08-03 NOTE — Telephone Encounter (Signed)
She should continue what she is taking and if no improvement she should be seen.

## 2016-08-03 NOTE — Progress Notes (Addendum)
Patient ID: Sandy Barrett, female   DOB: 11-Jul-1932, 83 y.o.   MRN: [id number]    HPI  Sandy Barrett is a 83 y.o.-year-old female, referred by her PCP, Dr. Quay Burow, for evaluation for compressive multinodular goiter.She is here with her daughter who offers all of the history as patient has expression aphasia after her stroke in 2016.  Her daughter [name]) describes that in 05/2016 >> pt started to have choking spells >> she ended up developing aspiration pneumonia in 05/2016.  She has a long history of toxic multinodular goiter, for which she had RAI tx in 2013 - also with the hope of reducing her goiter size. She had to restart methimazole after the treatment, now on MMI 5 mg 2x a week.  Reviewed the images  of the below studies along with patient and her daughter and reports: Es Ba swallow (04/23/2016):  A large substernal goiter is seen causing marked displacement of the esophagus to the right. A smooth stricture of the upper esophagus is seen at level thoracic inlet, likely due to external compression by substernal goiter. This does not obstruct the passage of thin barium. No evidence of distal esophageal stricture or hiatal hernia. Lack of primary peristalsis resulting in esophageal stasis.  CT neck (04/21/2016): Pharynx and larynx: Normal pharyngeal structures. Severe rightward displacement of the proximal trachea and larynx due to the leftward dominant multinodular goiter. This may be increased from prior of June 2017. Thyroid: Markedly enlarged and heterogeneous thyroid, with multiple nodules and scattered calcifications, significant substernal extent. In addition to mass effect on the trachea, the SVC is displaced and effaced. Approximate measurements are 97 x 45 x 68 mm, although the entire craniocaudal extent is not assessed on this CT neck study.  Swallowing study (04/21/2016): [name] presents with a mild oral/pharyngeal dysphagia, suspect primary esophageal  dysphagia. Oral phase impairments included mild prolonged mastication with regular solids (pt wears partial dentures). Observed mild vallecular and pyriform sinus residue with thin liquids via cup/straw due to reduced laryngeal elevation and epiglottic inversion. MBS does not diagnose below the level of the UES however scan of esophagus revealed stasis within the mid-esophagus and questionable stenosis. Pt previously diagnosed with a substernal goiter causing tracheal narrowing and deviation to the right possible causing observations. Educated pt re: results of MBS, diet recommendations, esophageal precautions (alternate solids/liquids, sit upright after meals), and advised pt to consider GI consult due to esophageal findings.   Pt cannot offer info Re: - feeling nodules in neck - hoarseness - choking - SOB with lying down  But daughter mentions dysphagia + choking with any foods - and also occas. vomiting after eating.  I reviewed pt's thyroid tests: Lab Results  Component Value Date   TSH 1.55 04/01/2016   TSH 1.25 12/03/2015   TSH 2.39 08/02/2015   TSH 0.97 05/17/2015   TSH 0.79 04/05/2015   TSH 0.205 (L) 03/12/2015   TSH 0.25 (L) 08/21/2014   TSH 0.133 (L) 03/10/2014   TSH 0.70 08/07/2013   TSH 0.67 08/31/2012   FREET4 1.04 04/01/2016   FREET4 0.94 12/03/2015   FREET4 1.54 08/02/2015   FREET4 1.07 05/17/2015   FREET4 1.10 04/05/2015   FREET4 1.33 08/31/2012   FREET4 1.09 06/02/2011   FREET4 1.24 10/28/2010   FREET4 1.12 07/22/2010     No FH of thyroid ds. No FH of thyroid cancer. No h/o radiation tx to head or neck except RAI tx.  No seaweed or kelp. + recent contrast studies - 2.5  mo ago. No steroid use. No Biotin supplements or Hair, Skin and Nails vitamins.   ROS - per daughter: Constitutional: no weight gain/loss, no fatigue, no subjective hyperthermia/hypothermia Eyes: no blurry vision, no xerophthalmia ENT: no sore throat, no nodules palpated in throat, +  dysphagia/no odynophagia, no hoarseness Cardiovascular: no CP/SOB/palpitations/+ leg swelling Respiratory: + cough/NO SOB Gastrointestinal: no N/V/D/+ C Musculoskeletal: no muscle/joint aches Skin: + rash - decubitus ulcer Neurological: no tremors/numbness/tingling/dizziness, + HA Psychiatric: no depression/anxiety  Past Medical History:  Diagnosis Date  . ARTHRITIS   . Arthritis   . Diverticulosis   . HYPERTENSION   . HYPERTHYROIDISM   . Hyperthyroidism    s/p I-131 ablation 03/2011 of multinod goiter  . INSOMNIA   . MIGRAINE HEADACHE   . OBESITY   . Posttraumatic stress disorder   . Pulmonary embolism ([place]) 03/2014   with DVT  . Stroke [facility]) 03/2014   dysarthria   Past Surgical History:  Procedure Laterality Date  . ABDOMINAL HYSTERECTOMY  1976  . BREAST SURGERY     biopsy  . RADIOLOGY WITH ANESTHESIA Left 03/08/2014   Procedure: RADIOLOGY WITH ANESTHESIA;  Surgeon: Rob Hickman, MD;  Location: [place];  Service: Radiology;  Laterality: Left;  . TONSILLECTOMY    . TOTAL HIP ARTHROPLASTY  1998    right   Social History   Social History  . Marital status: Widowed    Spouse name: N/A  . Number of children: 2  . Years of education: 14   Occupational History  . retired     Restaurant manager, fast food   Social History Main Topics  . Smoking status: Never Smoker  . Smokeless tobacco: Never Used  . Alcohol use No  . Drug use: No  . Sexual activity: No   Other Topics Concern  . Not on file   Social History Narrative   Widowed, lived alone prior to CVA 03/2014   SNF at [facility], then home 07/2014 with dtr   Right handed   Caffeine use- occasionally drinks tea   Current Outpatient Prescriptions on File Prior to Visit  Medication Sig Dispense Refill  . acetaminophen (TYLENOL) 160 MG/5ML liquid Take 480 mg by mouth every 4 (four) hours as needed for fever or pain.    [name] albuterol (PROVENTIL HFA) 108 (90 Base) MCG/ACT inhaler Inhale 2 puffs into the lungs every 6  (six) hours as needed for wheezing or shortness of breath. 1 Inhaler 1  . amoxicillin-clavulanate (AUGMENTIN) 875-125 MG tablet Take 1 tablet by mouth 2 (two) times daily. 14 tablet 0  . diclofenac sodium (VOLTAREN) 1 % GEL APPLY (2GMS) TOPICALLY THREE TIMES DAILY. 300 g 5  . gabapentin (NEURONTIN) 100 MG capsule Take 1 capsule (100 mg total) by mouth 2 (two) times daily. 60 capsule 2  . guaiFENesin (MUCINEX) 600 MG 12 hr tablet Take 1 tablet (600 mg total) by mouth 2 (two) times daily. (Patient taking differently: Take 600 mg by mouth 2 (two) times daily as needed for cough or to loosen phlegm. ) 10 tablet 0  . hydroxyurea (HYDREA) 500 MG capsule Take 2 capsules (1,000 mg total) by mouth daily. May take with food to minimize GI side effects. (Patient taking differently: Take 500 mg by mouth daily. ) 60 capsule 9  . ipratropium-albuterol (DUONEB) 0.5-2.5 (3) MG/3ML SOLN Take 3 mLs by nebulization every 6 (six) hours as needed. (Patient taking differently: Take 3 mLs by nebulization every 6 (six) hours as needed (shortness of breath). )  360 mL 0  . methimazole (TAPAZOLE) 5 MG tablet TAKE 1 TABLET BY MOUTH TWICE WEEKLY (Patient taking differently: TAKE 1 TABLET BY MOUTH TWICE WEEKLY (Sun and Thurs)) 12 tablet 2  . nystatin (MYCOSTATIN/NYSTOP) powder Apply topically 3 (three) times daily. 30 g 0  . polyethylene glycol (MIRALAX / GLYCOLAX) packet TAKE 17G BY MOUTH TWICE A DAY (Patient taking differently: TAKE 17G BY MOUTH TWICE A DAY AS NEED FOR CONSTIPATION) 14 packet 1  . Probiotic Product (ALIGN) 4 MG CAPS Take 1 capsule by mouth daily.     . propranolol (INDERAL) 10 MG tablet Take 0.5 tablets (5 mg total) by mouth 2 (two) times daily. 60 tablet 5  . PROTONIX 40 MG PACK TAKE 20 MLS (40 MG TOTAL) BY MOUTH DAILY. (Patient taking differently: Take 40 mg by mouth daily. Mix with applesauce) 30 each 5  . terconazole (TERAZOL 7) 0.4 % vaginal cream Place 1 applicator vaginally at bedtime. (Patient taking  differently: Place 1 applicator vaginally at bedtime as needed (irritation). ) 90 g 3  . traMADol (ULTRAM) 50 MG tablet TAKE 2 TABLETS BY MOUTH 3 TIMES A DAY AS NEEDED 180 tablet 1  . traZODone (DESYREL) 50 MG tablet TAKE 1 TABLET (50 MG TOTAL) BY MOUTH AT BEDTIME AS NEEDED. FOR SLEEP (Patient taking differently: Take 50 mg by mouth at bedtime. for sleep) 30 tablet 5  . triamcinolone ointment (KENALOG) 0.5 % Apply 1 application topically 2 (two) times daily. 30 g 0  . warfarin (COUMADIN) 2 MG tablet Take 2 tablets (4 mg total) by mouth one time only at 6 PM. 60 tablet 1   No current facility-administered medications on file prior to visit.    No Known Allergies Family History  Problem Relation Age of Onset  . Asthma Mother   . Asthma Father   . Prostate cancer Father   . Stroke Brother   . Breast cancer Sister   . Stomach cancer Sister   . Thyroid disease Neg Hx    PE: BP 120/68 (BP Location: Left Arm, Patient Position: Sitting)   Pulse 86   SpO2 91% ; patient could not be weighed, since she has right hemiparesis and she is in a wheelchair  Wt Readings from Last 3 Encounters:  05/19/16 154 lb 4.8 oz (70 kg)  04/29/16 164 lb 12.8 oz (74.8 kg)  04/13/16 176 lb (79.8 kg)   Constitutional: overweight, in NAD, in wheelchair, nonverbal  Eyes: PERRLA, EOMI, no exophthalmos ENT: moist mucous membranes, no thyromegaly!, no cervical lymphadenopathy Cardiovascular: RRR, No MRG Respiratory: CTA B Gastrointestinal: abdomen soft, NT, ND, BS+ Musculoskeletal: no deformities, R hemiparesis - arm and leg immobile Skin: moist, warm, no rashes Neurological: no tremor with outstretched L hand, R hemiparesis, expressive aphasia  ASSESSMENT: 1. Nodular goiter  PLAN: 1.  - I reviewed the images of her thyroid CT and Ba swallow along with the patient and her daughter. I pointed out that the thyroid gland is extremely large, substernal (I actually cannot feel any thyroid enlargement on palpation  today), compressing and displacing the trachea. It also looks that she had significant scattered lymphadenopathy on her 04/2016 CT scan. She has significant neck compression symptoms most likely due to the goiter and not her stroke per the Barium swallowing test and the previous swallowing test. She is choking when she eats and daughter feels that she has dysphagia and may have regurgitation after meals. She actually developed aspiration pneumonia 2 months ago. Patient did not get relief  of her symptoms after her RAI treatment in 2013. We did discuss with the daughter the RAI treatments may decrease the size of a goiter, however, it is unclear whether repeat treatment would help her especially since her hyperthyroidism is very mild (requiring only 2x 5 mg methimazole doses a week).   - we discussed that the best treatment for her would be thyroidectomy, but the question would be if she is a surgical candidate. She is on Coumadin which definitely poses a problem, she has thrombocytosis, she has hemiparesis so she is not mobile. She also has expressive aphasia which limits her ability to offer information about her symptoms. All in all, to perform surgery in this patient would be a challenging decision but I feel that not performing it is significantly limiting her quality of life. Therefore, I would like to refer her to surgery for a second opinion regarding whether she can have this surgery or not. I did discuss with the daughter that if she is not a candidate for surgery, we can attempt a second RAI treatment. - today we'll check her TFTs  will add a CBC per Dr.Gorsuch request - pt with Thrombocytosis; but also since she is on methimazole. - I will see her back in 3 months   - time spent with the patient and her daughter: 50 min, of which >50% was spent in obtaining information about her symptoms, reviewing her previous labs, imaging test results and reports, evaluations, and treatments, counseling her and her  daughter about her condition (please see the discussed topics above), and developing a plan to further investigate and treat it; her daughter had a number of questions which I addressed.   Office Visit on 08/03/2016  Component Date Value Ref Range Status  . Free T4 08/03/2016 1.13  0.60 - 1.60 ng/dL Final   Comment: Specimens from patients who are undergoing biotin therapy and /or ingesting biotin supplements may contain high levels of biotin.  The higher biotin concentration in these specimens interferes with this Free T4 assay.  Specimens that contain high levels  of biotin may cause false high results for this Free T4 assay.  Please interpret results in light of the total clinical presentation of the patient.    . T3, Free 08/03/2016 2.8  2.3 - 4.2 pg/mL Final  . TSH 08/03/2016 1.15  0.35 - 4.50 uIU/mL Final  . WBC 08/03/2016 4.9  4.0 - 10.5 K/uL Final  . RBC 08/03/2016 4.09  3.87 - 5.11 Mil/uL Final  . Platelets 08/03/2016 450.0* 150.0 - 400.0 K/uL Final  . Hemoglobin 08/03/2016 14.0  12.0 - 15.0 g/dL Final  . HCT 08/03/2016 44.4  36.0 - 46.0 % Final  . MCV 08/03/2016 108.5* 78.0 - 100.0 fl Final  . MCHC 08/03/2016 31.5  30.0 - 36.0 g/dL Final  . RDW 08/03/2016 17.9* 11.5 - 15.5 % Final   TFTs normal.  Philemon Kingdom, MD PhD [facility]

## 2016-08-04 ENCOUNTER — Telehealth: Payer: Self-pay | Admitting: Internal Medicine

## 2016-08-04 ENCOUNTER — Ambulatory Visit: Payer: Medicare Other | Admitting: Family Medicine

## 2016-08-04 ENCOUNTER — Ambulatory Visit: Payer: Medicare Other

## 2016-08-04 ENCOUNTER — Telehealth: Payer: Self-pay

## 2016-08-04 NOTE — Telephone Encounter (Signed)
Spoke with pts daughter to inform.  

## 2016-08-04 NOTE — Telephone Encounter (Signed)
Home health has a nurse going out tomorrow to see patient. If the INR is going to be done in home, they would like orders to do so. Please Advise.   [phone] Nira Conn  FYI: Patient has an appointment today with Dr.Schmitz

## 2016-08-04 NOTE — Telephone Encounter (Signed)
Spoke with Nira Conn to give order for INR. Appts have been cancelled for today due to pt not feel well enough to get out of bed and into the weather.

## 2016-08-04 NOTE — Telephone Encounter (Signed)
-----   Message from Philemon Kingdom, MD sent at 08/03/2016  5:09 PM EDT ----- Almyra Free, can you please call pt: TFts are normal. I will send the CBC to Dr. Alvy Bimler.

## 2016-08-04 NOTE — Telephone Encounter (Signed)
LVM, gave lab results. Gave call back number if any questions or concerns.  

## 2016-08-05 ENCOUNTER — Telehealth: Payer: Self-pay | Admitting: Internal Medicine

## 2016-08-05 ENCOUNTER — Ambulatory Visit (INDEPENDENT_AMBULATORY_CARE_PROVIDER_SITE_OTHER): Payer: Medicare Other | Admitting: General Practice

## 2016-08-05 DIAGNOSIS — I69351 Hemiplegia and hemiparesis following cerebral infarction affecting right dominant side: Secondary | ICD-10-CM | POA: Diagnosis not present

## 2016-08-05 DIAGNOSIS — L89322 Pressure ulcer of left buttock, stage 2: Secondary | ICD-10-CM | POA: Diagnosis not present

## 2016-08-05 DIAGNOSIS — I69391 Dysphagia following cerebral infarction: Secondary | ICD-10-CM | POA: Diagnosis not present

## 2016-08-05 DIAGNOSIS — R1310 Dysphagia, unspecified: Secondary | ICD-10-CM | POA: Diagnosis not present

## 2016-08-05 DIAGNOSIS — J690 Pneumonitis due to inhalation of food and vomit: Secondary | ICD-10-CM | POA: Diagnosis not present

## 2016-08-05 DIAGNOSIS — Z86711 Personal history of pulmonary embolism: Secondary | ICD-10-CM | POA: Diagnosis not present

## 2016-08-05 DIAGNOSIS — I82402 Acute embolism and thrombosis of unspecified deep veins of left lower extremity: Secondary | ICD-10-CM | POA: Diagnosis not present

## 2016-08-05 LAB — POCT INR: INR: 7

## 2016-08-05 NOTE — Progress Notes (Signed)
I agree with this plan.

## 2016-08-05 NOTE — Patient Instructions (Signed)
Pre visit review using our clinic review tool, if applicable. No additional management support is needed unless otherwise documented below in the visit note. 

## 2016-08-05 NOTE — Telephone Encounter (Signed)
Baker Janus from [facility] called to report the pts INR. INR - 7.0 and she is on 4mg  coumadin daily.  This was suppose to be checked on July 25th but it was not done. She checked it today while she was there with the pt. Baker Janus can be reached at [phone].

## 2016-08-05 NOTE — Telephone Encounter (Signed)
Thank you!  New orders have been given to Ferguson, RN @ [facility].

## 2016-08-06 ENCOUNTER — Ambulatory Visit: Payer: Medicare Other | Admitting: Family Medicine

## 2016-08-07 ENCOUNTER — Encounter: Payer: Self-pay | Admitting: Family Medicine

## 2016-08-07 ENCOUNTER — Telehealth: Payer: Self-pay

## 2016-08-07 ENCOUNTER — Ambulatory Visit (INDEPENDENT_AMBULATORY_CARE_PROVIDER_SITE_OTHER): Payer: Medicare Other | Admitting: General Practice

## 2016-08-07 ENCOUNTER — Telehealth: Payer: Self-pay | Admitting: General Practice

## 2016-08-07 ENCOUNTER — Ambulatory Visit (INDEPENDENT_AMBULATORY_CARE_PROVIDER_SITE_OTHER): Payer: Medicare Other | Admitting: Family Medicine

## 2016-08-07 ENCOUNTER — Telehealth: Payer: Self-pay | Admitting: Internal Medicine

## 2016-08-07 VITALS — BP 118/78 | HR 80 | Temp 97.4°F | Ht 60.0 in

## 2016-08-07 DIAGNOSIS — Z86711 Personal history of pulmonary embolism: Secondary | ICD-10-CM

## 2016-08-07 DIAGNOSIS — I82402 Acute embolism and thrombosis of unspecified deep veins of left lower extremity: Secondary | ICD-10-CM | POA: Diagnosis not present

## 2016-08-07 DIAGNOSIS — M25552 Pain in left hip: Secondary | ICD-10-CM | POA: Diagnosis not present

## 2016-08-07 DIAGNOSIS — J690 Pneumonitis due to inhalation of food and vomit: Secondary | ICD-10-CM | POA: Diagnosis not present

## 2016-08-07 DIAGNOSIS — I69391 Dysphagia following cerebral infarction: Secondary | ICD-10-CM | POA: Diagnosis not present

## 2016-08-07 DIAGNOSIS — M7989 Other specified soft tissue disorders: Secondary | ICD-10-CM

## 2016-08-07 DIAGNOSIS — I69351 Hemiplegia and hemiparesis following cerebral infarction affecting right dominant side: Secondary | ICD-10-CM | POA: Diagnosis not present

## 2016-08-07 DIAGNOSIS — R1310 Dysphagia, unspecified: Secondary | ICD-10-CM | POA: Diagnosis not present

## 2016-08-07 DIAGNOSIS — L89322 Pressure ulcer of left buttock, stage 2: Secondary | ICD-10-CM | POA: Diagnosis not present

## 2016-08-07 LAB — POCT INR: INR: 5.8

## 2016-08-07 NOTE — Telephone Encounter (Signed)
Called daughter with below message, verbalized understanding.

## 2016-08-07 NOTE — Patient Instructions (Signed)
Thank you for coming in,     You can follow-up with me if you do not have any improvement in the knee pain. Please let me know how the hip feels after this.  You can talk to your primary doctor about getting custom compression from a wound center.   Please feel free to call with any questions or concerns at any time, at [phone]. --Dr. Raeford Razor

## 2016-08-07 NOTE — Progress Notes (Signed)
Sandy Barrett - 83 y.o. female MRN [id number]  Date of birth: July 18, 1932  SUBJECTIVE:  Including CC & ROS.  Chief Complaint  Patient presents with  . Hip Pain    started sunday daughter states it is either left hip or thigh pain. daughter stated sharp pain where mother started to scream.    Sandy Barrett an 83 year old female as presenting with left hip pain. She is wheelchair bound and has aphasia from a stroke. Her history is provided by her daughter. She reports pain in her left hip/buttock area that has been occurring for the past few days. She denies any trauma in this area. The pain is worse when she leans back. She denies any rash in this area. She denies any history of any similar symptoms. She does have a history of DVT with chronically swollen left leg. She does not wear compression hoses. She did not have any shortness of breath. She is on chronic anticoagulation. She takes warfarin and her INR today is at 5.     Review of Systems  Constitutional: Negative for fever.  Musculoskeletal: Positive for myalgias. Negative for back pain.  Skin: Negative for rash.  Neurological: Positive for speech difficulty and weakness.  otherwise negative  HISTORY: Past Medical, Surgical, Social, and Family History Reviewed & Updated per EMR.   Pertinent Historical Findings include:  Past Medical History:  Diagnosis Date  . ARTHRITIS   . Arthritis   . Diverticulosis   . HYPERTENSION   . HYPERTHYROIDISM   . Hyperthyroidism    s/p I-131 ablation 03/2011 of multinod goiter  . INSOMNIA   . MIGRAINE HEADACHE   . OBESITY   . Posttraumatic stress disorder   . Pulmonary embolism ([place]) 03/2014   with DVT  . Stroke [facility]) 03/2014   dysarthria    Past Surgical History:  Procedure Laterality Date  . ABDOMINAL HYSTERECTOMY  1976  . BREAST SURGERY     biopsy  . RADIOLOGY WITH ANESTHESIA Left 03/08/2014   Procedure: RADIOLOGY WITH ANESTHESIA;  Surgeon: Rob Hickman, MD;  Location: [place];   Service: Radiology;  Laterality: Left;  . TONSILLECTOMY    . TOTAL HIP ARTHROPLASTY  1998    right    No Known Allergies  Family History  Problem Relation Age of Onset  . Asthma Mother   . Asthma Father   . Prostate cancer Father   . Stroke Brother   . Breast cancer Sister   . Stomach cancer Sister   . Thyroid disease Neg Hx      Social History   Social History  . Marital status: Widowed    Spouse name: N/A  . Number of children: 2  . Years of education: 14   Occupational History  . retired     Restaurant manager, fast food   Social History Main Topics  . Smoking status: Never Smoker  . Smokeless tobacco: Never Used  . Alcohol use No  . Drug use: No  . Sexual activity: No   Other Topics Concern  . Not on file   Social History Narrative   Widowed, lived alone prior to CVA 03/2014   SNF at [facility], then home 07/2014 with dtr   Right handed   Caffeine use- occasionally drinks tea     PHYSICAL EXAM:  VS: BP 118/78 (BP Location: Left Arm, Patient Position: Sitting, Cuff Size: Normal)   Pulse 80   Temp (!) 97.4 F (36.3 C) (Oral)   Ht 5' (1.524 m)  SpO2 93%  Physical Exam  Constitutional: She appears well-developed and well-nourished.  HENT:  Head: Normocephalic and atraumatic.  Right Ear: External ear normal.  Left Ear: External ear normal.  Eyes: Conjunctivae are normal. Right eye exhibits no discharge. Left eye exhibits no discharge. No scleral icterus.  Cardiovascular: Normal rate and regular rhythm.   Pulmonary/Chest: Effort normal and breath sounds normal.  Musculoskeletal:  Left hip: Tenderness to palpation of the greater trochanter. No tenderness to palpation over the left lumbar spine or paraspinal muscles. Negative straight leg raise. Significantly swollen left leg compared to the right leg. No tenderness to palpation when squeezing the calf. No erythema over the calf. Some limitation with knee extension. Exam limited as patient is  wheelchair-bound.  Neurological: She is alert.  Significant aphasia  Skin: Skin is warm. No erythema.     Aspiration/Injection Procedure Note Sandy [name] 01-Dec-1932  Procedure: Injection Indications: Left greater trochanter pain  Procedure Details Consent: Risks of procedure as well as the alternatives and risks of each were explained to the (patient/caregiver).  Consent for procedure obtained. Time Out: Verified patient identification, verified procedure, site/side was marked, verified correct patient position, special equipment/implants available, medications/allergies/relevent history reviewed, required imaging and test results available.  Performed.  The area was cleaned with iodine and alcohol swabs.    The left greater trochanter was injected using 1 cc's of 40 mg Depomedrol and 3 cc's of 1% lidocaine with a 25 1 1/2" needle.      A sterile dressing was applied.  Patient did tolerate procedure well.   ASSESSMENT & PLAN:   Left hip pain Her pain seems to be GT bursitis. Doesn't seem to be coming from her back. Is wheelchair bound so unsure how she developed this. No sign of skin breakdown.  - GT injection  - referral to PT  - f/u 2-3 weeks. May need an MRI

## 2016-08-07 NOTE — Telephone Encounter (Signed)
-----   Message from Heath Lark, MD sent at 08/07/2016  7:02 AM EDT ----- Regarding: labs Pls call daughter and let her know I have evaluated CBC drawn by endocrinologist. Lab is good. Continue same dose Hydrea

## 2016-08-07 NOTE — Patient Instructions (Signed)
Pre visit review using our clinic review tool, if applicable. No additional management support is needed unless otherwise documented below in the visit note. 

## 2016-08-07 NOTE — Progress Notes (Signed)
I have reviewed and agree with the plan. 

## 2016-08-07 NOTE — Telephone Encounter (Signed)
New coumadin orders left on VM.  Orders also given to home health RN, [name] @ [facility].

## 2016-08-07 NOTE — Telephone Encounter (Signed)
Pt's Dtr stated Dr Raeford Razor mentioned a referral for custom compression from a wound center.  He was not sure if Dr Quay Burow had considered this before for the pt or not and pt's dtr stated he did not want to overstep Dr Quay Burow' possible recommendation for the pt.

## 2016-08-09 DIAGNOSIS — M25552 Pain in left hip: Secondary | ICD-10-CM | POA: Insufficient documentation

## 2016-08-09 NOTE — Telephone Encounter (Signed)
Yes this is something they can try - it may help with the swelling.

## 2016-08-09 NOTE — Assessment & Plan Note (Signed)
Her pain seems to be GT bursitis. Doesn't seem to be coming from her back. Is wheelchair bound so unsure how she developed this. No sign of skin breakdown.  - GT injection  - referral to PT  - f/u 2-3 weeks. May need an MRI

## 2016-08-10 ENCOUNTER — Ambulatory Visit (INDEPENDENT_AMBULATORY_CARE_PROVIDER_SITE_OTHER): Payer: Medicare Other | Admitting: General Practice

## 2016-08-10 DIAGNOSIS — I82402 Acute embolism and thrombosis of unspecified deep veins of left lower extremity: Secondary | ICD-10-CM | POA: Diagnosis not present

## 2016-08-10 DIAGNOSIS — I69391 Dysphagia following cerebral infarction: Secondary | ICD-10-CM | POA: Diagnosis not present

## 2016-08-10 DIAGNOSIS — Z86711 Personal history of pulmonary embolism: Secondary | ICD-10-CM | POA: Diagnosis not present

## 2016-08-10 DIAGNOSIS — J690 Pneumonitis due to inhalation of food and vomit: Secondary | ICD-10-CM | POA: Diagnosis not present

## 2016-08-10 DIAGNOSIS — I69351 Hemiplegia and hemiparesis following cerebral infarction affecting right dominant side: Secondary | ICD-10-CM | POA: Diagnosis not present

## 2016-08-10 DIAGNOSIS — R1310 Dysphagia, unspecified: Secondary | ICD-10-CM | POA: Diagnosis not present

## 2016-08-10 DIAGNOSIS — L89322 Pressure ulcer of left buttock, stage 2: Secondary | ICD-10-CM | POA: Diagnosis not present

## 2016-08-10 LAB — POCT INR: INR: 2.4

## 2016-08-10 NOTE — Patient Instructions (Signed)
Pre visit review using our clinic review tool, if applicable. No additional management support is needed unless otherwise documented below in the visit note. 

## 2016-08-11 ENCOUNTER — Telehealth: Payer: Self-pay | Admitting: Internal Medicine

## 2016-08-11 MED ORDER — METHYLPREDNISOLONE ACETATE 40 MG/ML IJ SUSP
40.0000 mg | Freq: Once | INTRAMUSCULAR | Status: AC
  Administered 2016-08-11: 40 mg via INTRA_ARTICULAR

## 2016-08-11 NOTE — Addendum Note (Signed)
Addended by: Raford Pitcher R on: 08/11/2016 03:23 PM   Modules accepted: Orders

## 2016-08-11 NOTE — Telephone Encounter (Signed)
[name] from [facility] called requesting verbal orders to recertifiy the pt. She said that her coumadin has not been very steady latley and there has been a lot of medication changes. They would like to continue seeing her until everything stabilizes.

## 2016-08-11 NOTE — Telephone Encounter (Signed)
Spoke with [name] to give verbal orders per MD

## 2016-08-13 NOTE — Telephone Encounter (Signed)
SPoke with pts daughter. She is concerned that pts leg is still swelling. She did say that yesterday and today seemed to be little pain or pain free in her leg. She gives tylenol and an extra tramadol when in pain. She would like the referral entered for custom compression.

## 2016-08-13 NOTE — Telephone Encounter (Signed)
ordered

## 2016-08-17 ENCOUNTER — Ambulatory Visit (INDEPENDENT_AMBULATORY_CARE_PROVIDER_SITE_OTHER): Payer: Medicare Other | Admitting: General Practice

## 2016-08-17 DIAGNOSIS — D510 Vitamin B12 deficiency anemia due to intrinsic factor deficiency: Secondary | ICD-10-CM | POA: Diagnosis not present

## 2016-08-17 DIAGNOSIS — Z5181 Encounter for therapeutic drug level monitoring: Secondary | ICD-10-CM | POA: Diagnosis not present

## 2016-08-17 DIAGNOSIS — I69391 Dysphagia following cerebral infarction: Secondary | ICD-10-CM | POA: Diagnosis not present

## 2016-08-17 DIAGNOSIS — I69351 Hemiplegia and hemiparesis following cerebral infarction affecting right dominant side: Secondary | ICD-10-CM | POA: Diagnosis not present

## 2016-08-17 DIAGNOSIS — L89322 Pressure ulcer of left buttock, stage 2: Secondary | ICD-10-CM | POA: Diagnosis not present

## 2016-08-17 DIAGNOSIS — I10 Essential (primary) hypertension: Secondary | ICD-10-CM | POA: Diagnosis not present

## 2016-08-17 DIAGNOSIS — F4310 Post-traumatic stress disorder, unspecified: Secondary | ICD-10-CM | POA: Diagnosis not present

## 2016-08-17 DIAGNOSIS — R1310 Dysphagia, unspecified: Secondary | ICD-10-CM | POA: Diagnosis not present

## 2016-08-17 DIAGNOSIS — Z7901 Long term (current) use of anticoagulants: Secondary | ICD-10-CM | POA: Diagnosis not present

## 2016-08-17 DIAGNOSIS — I69320 Aphasia following cerebral infarction: Secondary | ICD-10-CM | POA: Diagnosis not present

## 2016-08-17 DIAGNOSIS — Z86711 Personal history of pulmonary embolism: Secondary | ICD-10-CM | POA: Diagnosis not present

## 2016-08-17 DIAGNOSIS — Z8701 Personal history of pneumonia (recurrent): Secondary | ICD-10-CM | POA: Diagnosis not present

## 2016-08-17 DIAGNOSIS — I82402 Acute embolism and thrombosis of unspecified deep veins of left lower extremity: Secondary | ICD-10-CM | POA: Diagnosis not present

## 2016-08-17 DIAGNOSIS — H9190 Unspecified hearing loss, unspecified ear: Secondary | ICD-10-CM | POA: Diagnosis not present

## 2016-08-17 LAB — POCT INR: INR: 3

## 2016-08-17 NOTE — Patient Instructions (Signed)
Pre visit review using our clinic review tool, if applicable. No additional management support is needed unless otherwise documented below in the visit note. 

## 2016-08-17 NOTE — Progress Notes (Signed)
I agree with this plan.

## 2016-08-18 ENCOUNTER — Telehealth: Payer: Self-pay

## 2016-08-18 NOTE — Telephone Encounter (Signed)
[name] (patient's daughter) has called stating [name], right arm has been bothering her, she is unable to put clothes on Sandy Barrett or more her arm. Ms.Teresa would like a call back regarding this.

## 2016-08-19 NOTE — Telephone Encounter (Signed)
Should be coming in for 6 week follow-up. I could see her then. If she is not scheduled. I like to see her back to evaluate.

## 2016-08-19 NOTE — Telephone Encounter (Signed)
I will call her, but any advice to offer? Last [place]ys[place] was June 29.

## 2016-08-20 NOTE — Telephone Encounter (Signed)
Contacted patients daughter. Made an appointment for 08/25/2016.

## 2016-08-20 NOTE — Telephone Encounter (Signed)
Spoke with [name] to give verbal orders per MD.

## 2016-08-20 NOTE — Telephone Encounter (Signed)
[name] called wanted to get verbals for continuation of home health aid for Sandy Barrett  Would like a social worker evaluation   And a PT evaluation   Please call back [phone]

## 2016-08-21 ENCOUNTER — Ambulatory Visit: Payer: Medicare Other | Admitting: Internal Medicine

## 2016-08-21 ENCOUNTER — Ambulatory Visit (INDEPENDENT_AMBULATORY_CARE_PROVIDER_SITE_OTHER): Payer: Medicare Other | Admitting: Internal Medicine

## 2016-08-21 ENCOUNTER — Ambulatory Visit (INDEPENDENT_AMBULATORY_CARE_PROVIDER_SITE_OTHER)
Admission: RE | Admit: 2016-08-21 | Discharge: 2016-08-21 | Disposition: A | Discharge status: Home / Self Care | Payer: Medicare Other | Source: Ambulatory Visit | Attending: Internal Medicine | Admitting: Internal Medicine

## 2016-08-21 ENCOUNTER — Encounter: Payer: Self-pay | Admitting: Internal Medicine

## 2016-08-21 VITALS — BP 114/84 | HR 88 | Temp 98.1°F | Ht 60.0 in

## 2016-08-21 DIAGNOSIS — I10 Essential (primary) hypertension: Secondary | ICD-10-CM

## 2016-08-21 DIAGNOSIS — R05 Cough: Secondary | ICD-10-CM

## 2016-08-21 DIAGNOSIS — R062 Wheezing: Secondary | ICD-10-CM | POA: Diagnosis not present

## 2016-08-21 DIAGNOSIS — Z889 Allergy status to unspecified drugs, medicaments and biological substances status: Secondary | ICD-10-CM | POA: Diagnosis not present

## 2016-08-21 DIAGNOSIS — R059 Cough, unspecified: Secondary | ICD-10-CM

## 2016-08-21 MED ORDER — PREDNISONE 10 MG PO TABS: ORAL_TABLET | ORAL | 0 refills | Status: DC

## 2016-08-21 MED ORDER — METHYLPREDNISOLONE ACETATE 80 MG/ML IJ SUSP
80.0000 mg | Freq: Once | INTRAMUSCULAR | Status: AC
  Administered 2016-08-21: 80 mg via INTRAMUSCULAR

## 2016-08-21 NOTE — Patient Instructions (Signed)
You had the steroid shot today  Please take all new medication as prescribed - the prednisone  Please check with the pharmacy by later this evening or at the latest 3PM tomorrow; as if she needs an antibiotic, there will be one there (there would be no antibiotic if the xray is negative)  Please continue all other medications as before, and refills have been done if requested.  Please have the pharmacy call with any other refills you may need.  Please keep your appointments with your specialists as you may have planned  Please go to the XRAY Department in the Basement (go straight as you get off the elevator) for the x-ray testing  You will be contacted by phone if any changes need to be made immediately.  Otherwise, you will receive a letter about your results with an explanation, but please check with MyChart first.  Please remember to sign up for MyChart if you have not done so, as this will be important to you in the future with finding out test results, communicating by private email, and scheduling acute appointments online when needed.

## 2016-08-22 ENCOUNTER — Encounter: Payer: Self-pay | Admitting: Internal Medicine

## 2016-08-23 DIAGNOSIS — R059 Cough, unspecified: Secondary | ICD-10-CM | POA: Insufficient documentation

## 2016-08-23 DIAGNOSIS — R05 Cough: Secondary | ICD-10-CM | POA: Insufficient documentation

## 2016-08-23 DIAGNOSIS — R062 Wheezing: Secondary | ICD-10-CM | POA: Insufficient documentation

## 2016-08-23 NOTE — Progress Notes (Signed)
Subjective:    Patient ID: Sandy Barrett, female    DOB: 10-09-1932, 83 y.o.   MRN: [id number]  HPI  Here with acute onset mild to mod 2-3 days ST, HA, general weakness and malaise, with prod cough greenish sputum, but Pt denies chest pain, increased sob or doe, wheezing, orthopnea, PND, increased LE swelling, palpitations, dizziness or syncope, except for mild doe and wheeziness since last PM.  Pt denies new neurological symptoms such as new headache, or facial or extremity weakness or numbness   Pt denies polydipsia, polyuria Past Medical History:  Diagnosis Date  . ARTHRITIS   . Arthritis   . Diverticulosis   . HYPERTENSION   . HYPERTHYROIDISM   . Hyperthyroidism    s/p I-131 ablation 03/2011 of multinod goiter  . INSOMNIA   . MIGRAINE HEADACHE   . OBESITY   . Posttraumatic stress disorder   . Pulmonary embolism ([place]) 03/2014   with DVT  . Stroke [facility]) 03/2014   dysarthria   Past Surgical History:  Procedure Laterality Date  . ABDOMINAL HYSTERECTOMY  1976  . BREAST SURGERY     biopsy  . RADIOLOGY WITH ANESTHESIA Left 03/08/2014   Procedure: RADIOLOGY WITH ANESTHESIA;  Surgeon: Rob Hickman, MD;  Location: [place];  Service: Radiology;  Laterality: Left;  . TONSILLECTOMY    . TOTAL HIP ARTHROPLASTY  1998    right    reports that she has never smoked. She has never used smokeless tobacco. She reports that she does not drink alcohol or use drugs. family history includes Asthma in her father and mother; Breast cancer in her sister; Prostate cancer in her father; Stomach cancer in her sister; Stroke in her brother. No Known Allergies Current Outpatient Prescriptions on File Prior to Visit  Medication Sig Dispense Refill  . acetaminophen (TYLENOL) 160 MG/5ML liquid Take 480 mg by mouth every 4 (four) hours as needed for fever or pain.    [name] albuterol (PROVENTIL HFA) 108 (90 Base) MCG/ACT inhaler Inhale 2 puffs into the lungs every 6 (six) hours as needed for wheezing or  shortness of breath. 1 Inhaler 1  . diclofenac sodium (VOLTAREN) 1 % GEL APPLY (2GMS) TOPICALLY THREE TIMES DAILY. 300 g 5  . gabapentin (NEURONTIN) 100 MG capsule Take 1 capsule (100 mg total) by mouth 2 (two) times daily. 60 capsule 2  . guaiFENesin (MUCINEX) 600 MG 12 hr tablet Take 1 tablet (600 mg total) by mouth 2 (two) times daily. (Patient taking differently: Take 600 mg by mouth 2 (two) times daily as needed for cough or to loosen phlegm. ) 10 tablet 0  . hydroxyurea (HYDREA) 500 MG capsule Take 2 capsules (1,000 mg total) by mouth daily. May take with food to minimize GI side effects. (Patient taking differently: Take 500 mg by mouth daily. ) 60 capsule 9  . ipratropium-albuterol (DUONEB) 0.5-2.5 (3) MG/3ML SOLN Take 3 mLs by nebulization every 6 (six) hours as needed. (Patient taking differently: Take 3 mLs by nebulization every 6 (six) hours as needed (shortness of breath). ) 360 mL 0  . methimazole (TAPAZOLE) 5 MG tablet TAKE 1 TABLET BY MOUTH TWICE WEEKLY (Patient taking differently: TAKE 1 TABLET BY MOUTH TWICE WEEKLY (Sun and Thurs)) 12 tablet 2  . nystatin (MYCOSTATIN/NYSTOP) powder Apply topically 3 (three) times daily. 30 g 0  . polyethylene glycol (MIRALAX / GLYCOLAX) packet TAKE 17G BY MOUTH TWICE A DAY (Patient taking differently: TAKE 17G BY MOUTH TWICE A DAY AS NEED FOR  CONSTIPATION) 14 packet 1  . Probiotic Product (ALIGN) 4 MG CAPS Take 1 capsule by mouth daily.     . propranolol (INDERAL) 10 MG tablet Take 0.5 tablets (5 mg total) by mouth 2 (two) times daily. 60 tablet 5  . PROTONIX 40 MG PACK TAKE 20 MLS (40 MG TOTAL) BY MOUTH DAILY. (Patient taking differently: Take 40 mg by mouth daily. Mix with applesauce) 30 each 5  . terconazole (TERAZOL 7) 0.4 % vaginal cream Place 1 applicator vaginally at bedtime. (Patient taking differently: Place 1 applicator vaginally at bedtime as needed (irritation). ) 90 g 3  . traMADol (ULTRAM) 50 MG tablet TAKE 2 TABLETS BY MOUTH 3 TIMES A  DAY AS NEEDED 180 tablet 1  . traZODone (DESYREL) 50 MG tablet TAKE 1 TABLET (50 MG TOTAL) BY MOUTH AT BEDTIME AS NEEDED. FOR SLEEP (Patient taking differently: Take 50 mg by mouth at bedtime. for sleep) 30 tablet 5  . triamcinolone ointment (KENALOG) 0.5 % Apply 1 application topically 2 (two) times daily. 30 g 0  . warfarin (COUMADIN) 2 MG tablet Take 2 tablets (4 mg total) by mouth one time only at 6 PM. 60 tablet 1   No current facility-administered medications on file prior to visit.    Review of Systems  Constitutional: Negative for other unusual diaphoresis or sweats HENT: Negative for ear discharge or swelling Eyes: Negative for other worsening visual disturbances Respiratory: Negative for stridor or other swelling  Gastrointestinal: Negative for worsening distension or other blood Genitourinary: Negative for retention or other urinary change Musculoskeletal: Negative for other MSK pain or swelling Skin: Negative for color change or other new lesions Neurological: Negative for worsening tremors and other numbness  Psychiatric/Behavioral: Negative for worsening agitation or other fatigue All other system neg per pt    Objective:   Physical Exam BP 114/84   Pulse 88   Temp 98.1 F (36.7 C)   Ht 5' (1.524 m)   SpO2 96%  VS noted, mild ill Constitutional: Pt appears in NAD HENT: Head: NCAT.  Right Ear: External ear normal.  Left Ear: External ear normal.  Eyes: . Pupils are equal, round, and reactive to light. Conjunctivae and EOM are normal Nose: without d/c or deformity Bilat tm's with mild erythema.  Max sinus areas non tender.  Pharynx with mild erythema, no exudate Neck: Neck supple. Gross normal ROM Cardiovascular: Normal rate and regular rhythm.   Pulmonary/Chest: Effort normal and breath sounds without rales and mild scattered wheezing.  Neurological: Pt is alert. At baseline orientation, not o/w done in detail Skin: Skin is warm. No rashes, other new lesions, no  LE edema Psychiatric: Pt behavior is normal without agitation  No other exam findings    Assessment & Plan:

## 2016-08-23 NOTE — Assessment & Plan Note (Signed)
Mild to mod, c/w bronchitis  Vs pna, for cxr, for antibx course,  to f/u any worsening symptoms or concerns

## 2016-08-23 NOTE — Assessment & Plan Note (Signed)
Mild, for depomedrol IM 80, predpac asd,  to f/u any worsening symptoms or concerns 

## 2016-08-23 NOTE — Assessment & Plan Note (Signed)
stable overall by history and exam, recent data reviewed with pt, and pt to continue medical treatment as before,  to f/u any worsening symptoms or concerns BP Readings from Last 3 Encounters:  08/21/16 114/84  08/07/16 118/78  08/03/16 120/68

## 2016-08-24 ENCOUNTER — Ambulatory Visit (INDEPENDENT_AMBULATORY_CARE_PROVIDER_SITE_OTHER): Payer: Medicare Other | Admitting: General Practice

## 2016-08-24 ENCOUNTER — Telehealth: Payer: Self-pay | Admitting: Internal Medicine

## 2016-08-24 ENCOUNTER — Ambulatory Visit: Payer: Medicare Other | Admitting: Internal Medicine

## 2016-08-24 ENCOUNTER — Encounter: Payer: Self-pay | Admitting: Internal Medicine

## 2016-08-24 DIAGNOSIS — R1310 Dysphagia, unspecified: Secondary | ICD-10-CM | POA: Diagnosis not present

## 2016-08-24 DIAGNOSIS — I69320 Aphasia following cerebral infarction: Secondary | ICD-10-CM | POA: Diagnosis not present

## 2016-08-24 DIAGNOSIS — I69351 Hemiplegia and hemiparesis following cerebral infarction affecting right dominant side: Secondary | ICD-10-CM | POA: Diagnosis not present

## 2016-08-24 DIAGNOSIS — L89322 Pressure ulcer of left buttock, stage 2: Secondary | ICD-10-CM | POA: Diagnosis not present

## 2016-08-24 DIAGNOSIS — I82409 Acute embolism and thrombosis of unspecified deep veins of unspecified lower extremity: Secondary | ICD-10-CM

## 2016-08-24 DIAGNOSIS — I69391 Dysphagia following cerebral infarction: Secondary | ICD-10-CM | POA: Diagnosis not present

## 2016-08-24 DIAGNOSIS — Z86711 Personal history of pulmonary embolism: Secondary | ICD-10-CM

## 2016-08-24 DIAGNOSIS — I10 Essential (primary) hypertension: Secondary | ICD-10-CM | POA: Diagnosis not present

## 2016-08-24 LAB — POCT INR: INR: 4

## 2016-08-24 NOTE — Telephone Encounter (Signed)
Spoke with [name] to give verbal orders per MD

## 2016-08-24 NOTE — Progress Notes (Signed)
I agree with this plan.

## 2016-08-24 NOTE — Patient Instructions (Signed)
Pre visit review using our clinic review tool, if applicable. No additional management support is needed unless otherwise documented below in the visit note. 

## 2016-08-24 NOTE — Telephone Encounter (Signed)
Sandy Barrett from [facility] is out visiting the pt. She said that she is showing signs of a UTI. She is requesting orders for an In and Out Cath for UA. She can be reached at [phone].

## 2016-08-25 ENCOUNTER — Ambulatory Visit: Payer: Medicare Other | Admitting: Physical Medicine & Rehabilitation

## 2016-08-25 ENCOUNTER — Telehealth: Payer: Self-pay | Admitting: Internal Medicine

## 2016-08-25 MED ORDER — CEPHALEXIN 500 MG PO CAPS: 500.0000 mg | ORAL_CAPSULE | Freq: Three times a day (TID) | ORAL | 0 refills | Status: DC

## 2016-08-25 NOTE — Telephone Encounter (Signed)
Urine shows an infection - antibiotic sent to [facility].  If symptoms do not clear we will need to recheck her urine.

## 2016-08-25 NOTE — Telephone Encounter (Signed)
LVM with Pts daughter informing RX was sent to [facility]

## 2016-08-26 DIAGNOSIS — L89322 Pressure ulcer of left buttock, stage 2: Secondary | ICD-10-CM | POA: Diagnosis not present

## 2016-08-26 DIAGNOSIS — R1310 Dysphagia, unspecified: Secondary | ICD-10-CM | POA: Diagnosis not present

## 2016-08-26 DIAGNOSIS — I10 Essential (primary) hypertension: Secondary | ICD-10-CM | POA: Diagnosis not present

## 2016-08-26 DIAGNOSIS — I69320 Aphasia following cerebral infarction: Secondary | ICD-10-CM | POA: Diagnosis not present

## 2016-08-26 DIAGNOSIS — I69351 Hemiplegia and hemiparesis following cerebral infarction affecting right dominant side: Secondary | ICD-10-CM | POA: Diagnosis not present

## 2016-08-26 DIAGNOSIS — I69391 Dysphagia following cerebral infarction: Secondary | ICD-10-CM | POA: Diagnosis not present

## 2016-08-28 ENCOUNTER — Telehealth: Payer: Self-pay | Admitting: Internal Medicine

## 2016-08-28 DIAGNOSIS — L89322 Pressure ulcer of left buttock, stage 2: Secondary | ICD-10-CM | POA: Diagnosis not present

## 2016-08-28 DIAGNOSIS — I69320 Aphasia following cerebral infarction: Secondary | ICD-10-CM | POA: Diagnosis not present

## 2016-08-28 DIAGNOSIS — I10 Essential (primary) hypertension: Secondary | ICD-10-CM | POA: Diagnosis not present

## 2016-08-28 DIAGNOSIS — R1310 Dysphagia, unspecified: Secondary | ICD-10-CM | POA: Diagnosis not present

## 2016-08-28 DIAGNOSIS — I69391 Dysphagia following cerebral infarction: Secondary | ICD-10-CM | POA: Diagnosis not present

## 2016-08-28 DIAGNOSIS — I69351 Hemiplegia and hemiparesis following cerebral infarction affecting right dominant side: Secondary | ICD-10-CM | POA: Diagnosis not present

## 2016-08-28 NOTE — Telephone Encounter (Signed)
[facility] called stating they have not been able to contact the Pts aid and will plan on doing the OT evaluation next week [phone]

## 2016-08-28 NOTE — Telephone Encounter (Signed)
Spoke with Sandy Barrett stating it was okay to do OT eval next week per MD

## 2016-08-31 ENCOUNTER — Ambulatory Visit (INDEPENDENT_AMBULATORY_CARE_PROVIDER_SITE_OTHER): Payer: Medicare Other | Admitting: General Practice

## 2016-08-31 ENCOUNTER — Telehealth: Payer: Self-pay | Admitting: Emergency Medicine

## 2016-08-31 DIAGNOSIS — R1310 Dysphagia, unspecified: Secondary | ICD-10-CM | POA: Diagnosis not present

## 2016-08-31 DIAGNOSIS — Z86711 Personal history of pulmonary embolism: Secondary | ICD-10-CM | POA: Diagnosis not present

## 2016-08-31 DIAGNOSIS — M7989 Other specified soft tissue disorders: Secondary | ICD-10-CM

## 2016-08-31 DIAGNOSIS — I82409 Acute embolism and thrombosis of unspecified deep veins of unspecified lower extremity: Secondary | ICD-10-CM | POA: Diagnosis not present

## 2016-08-31 DIAGNOSIS — I69320 Aphasia following cerebral infarction: Secondary | ICD-10-CM | POA: Diagnosis not present

## 2016-08-31 DIAGNOSIS — I69391 Dysphagia following cerebral infarction: Secondary | ICD-10-CM | POA: Diagnosis not present

## 2016-08-31 DIAGNOSIS — L89322 Pressure ulcer of left buttock, stage 2: Secondary | ICD-10-CM | POA: Diagnosis not present

## 2016-08-31 DIAGNOSIS — I10 Essential (primary) hypertension: Secondary | ICD-10-CM | POA: Diagnosis not present

## 2016-08-31 DIAGNOSIS — I69351 Hemiplegia and hemiparesis following cerebral infarction affecting right dominant side: Secondary | ICD-10-CM | POA: Diagnosis not present

## 2016-08-31 LAB — POCT INR: INR: 5

## 2016-08-31 NOTE — Telephone Encounter (Signed)
Pts daughter called stating that she miss read the instructions and gave the pt Keflex 2 times a day rather than 3 times a day. The RX was for 5 days. Please advise.

## 2016-08-31 NOTE — Telephone Encounter (Signed)
Spoke with pts daughter to inform.  

## 2016-08-31 NOTE — Patient Instructions (Signed)
Pre visit review using our clinic review tool, if applicable. No additional management support is needed unless otherwise documented below in the visit note. 

## 2016-08-31 NOTE — Progress Notes (Signed)
I agree with this plan.

## 2016-08-31 NOTE — Telephone Encounter (Signed)
Ok, if she has any residual symptoms after completing the antibiotic we should recheck her urine.

## 2016-09-01 DIAGNOSIS — R1310 Dysphagia, unspecified: Secondary | ICD-10-CM | POA: Diagnosis not present

## 2016-09-01 DIAGNOSIS — I69351 Hemiplegia and hemiparesis following cerebral infarction affecting right dominant side: Secondary | ICD-10-CM | POA: Diagnosis not present

## 2016-09-01 DIAGNOSIS — I69391 Dysphagia following cerebral infarction: Secondary | ICD-10-CM | POA: Diagnosis not present

## 2016-09-01 DIAGNOSIS — I69320 Aphasia following cerebral infarction: Secondary | ICD-10-CM | POA: Diagnosis not present

## 2016-09-01 DIAGNOSIS — I10 Essential (primary) hypertension: Secondary | ICD-10-CM | POA: Diagnosis not present

## 2016-09-01 DIAGNOSIS — L89322 Pressure ulcer of left buttock, stage 2: Secondary | ICD-10-CM | POA: Diagnosis not present

## 2016-09-03 ENCOUNTER — Ambulatory Visit (INDEPENDENT_AMBULATORY_CARE_PROVIDER_SITE_OTHER): Payer: Medicare Other | Admitting: General Practice

## 2016-09-03 DIAGNOSIS — R1310 Dysphagia, unspecified: Secondary | ICD-10-CM | POA: Diagnosis not present

## 2016-09-03 DIAGNOSIS — Z86711 Personal history of pulmonary embolism: Secondary | ICD-10-CM

## 2016-09-03 DIAGNOSIS — L89322 Pressure ulcer of left buttock, stage 2: Secondary | ICD-10-CM | POA: Diagnosis not present

## 2016-09-03 DIAGNOSIS — I69320 Aphasia following cerebral infarction: Secondary | ICD-10-CM | POA: Diagnosis not present

## 2016-09-03 DIAGNOSIS — I69391 Dysphagia following cerebral infarction: Secondary | ICD-10-CM | POA: Diagnosis not present

## 2016-09-03 DIAGNOSIS — I10 Essential (primary) hypertension: Secondary | ICD-10-CM | POA: Diagnosis not present

## 2016-09-03 DIAGNOSIS — I69351 Hemiplegia and hemiparesis following cerebral infarction affecting right dominant side: Secondary | ICD-10-CM | POA: Diagnosis not present

## 2016-09-03 LAB — POCT INR: INR: 2.3

## 2016-09-03 NOTE — Patient Instructions (Signed)
Pre visit review using our clinic review tool, if applicable. No additional management support is needed unless otherwise documented below in the visit note. 

## 2016-09-03 NOTE — Progress Notes (Signed)
I have reviewed and agree with the plan. 

## 2016-09-04 ENCOUNTER — Other Ambulatory Visit: Payer: Self-pay | Admitting: Internal Medicine

## 2016-09-08 ENCOUNTER — Encounter: Payer: Medicare Other | Attending: Physical Medicine & Rehabilitation

## 2016-09-08 ENCOUNTER — Ambulatory Visit (HOSPITAL_BASED_OUTPATIENT_CLINIC_OR_DEPARTMENT_OTHER): Payer: Medicare Other | Admitting: Physical Medicine & Rehabilitation

## 2016-09-08 ENCOUNTER — Encounter: Payer: Self-pay | Admitting: Physical Medicine & Rehabilitation

## 2016-09-08 VITALS — BP 101/73 | HR 86

## 2016-09-08 DIAGNOSIS — G8110 Spastic hemiplegia affecting unspecified side: Secondary | ICD-10-CM

## 2016-09-08 NOTE — Progress Notes (Signed)
Subjective:    Patient ID: Sandy Barrett, female    DOB: 1932/02/07, 83 y.o.   MRN: [id number]  HPI  Pain Inventory Average Pain 6 Pain Right Now 6 My pain is intermittent, sharp, stabbing and aching  In the last 24 hours, has pain interfered with the following? General activity 8 Relation with others 8 Enjoyment of life 8 What TIME of day is your pain at its worst? morning Sleep (in general) Fair  Pain is worse with: bending Pain improves with: rest, medication and injections Relief from Meds: .  Mobility use a wheelchair needs help with transfers  Function retired  Neuro/Psych bladder control problems bowel control problems weakness trouble walking spasms dizziness  Prior Studies Any changes since last visit?  no  Physicians involved in your care Any changes since last visit?  no   Family History  Problem Relation Age of Onset  . Asthma Mother   . Asthma Father   . Prostate cancer Father   . Stroke Brother   . Breast cancer Sister   . Stomach cancer Sister   . Thyroid disease Neg Hx    Social History   Social History  . Marital status: Widowed    Spouse name: N/A  . Number of children: 2  . Years of education: 14   Occupational History  . retired     Restaurant manager, fast food   Social History Main Topics  . Smoking status: Never Smoker  . Smokeless tobacco: Never Used  . Alcohol use No  . Drug use: No  . Sexual activity: No   Other Topics Concern  . Not on file   Social History Narrative   Widowed, lived alone prior to CVA 03/2014   SNF at [facility], then home 07/2014 with dtr   Right handed   Caffeine use- occasionally drinks tea   Past Surgical History:  Procedure Laterality Date  . ABDOMINAL HYSTERECTOMY  1976  . BREAST SURGERY     biopsy  . RADIOLOGY WITH ANESTHESIA Left 03/08/2014   Procedure: RADIOLOGY WITH ANESTHESIA;  Surgeon: Rob Hickman, MD;  Location: [place];  Service: Radiology;  Laterality: Left;  .  TONSILLECTOMY    . TOTAL HIP ARTHROPLASTY  1998    right   Past Medical History:  Diagnosis Date  . ARTHRITIS   . Arthritis   . Diverticulosis   . HYPERTENSION   . HYPERTHYROIDISM   . Hyperthyroidism    s/p I-131 ablation 03/2011 of multinod goiter  . INSOMNIA   . MIGRAINE HEADACHE   . OBESITY   . Posttraumatic stress disorder   . Pulmonary embolism ([place]) 03/2014   with DVT  . Stroke [facility]) 03/2014   dysarthria   There were no vitals taken for this visit.  Opioid Risk Score:   Fall Risk Score:  `1  Depression screen PHQ 2/9  Depression screen [facility] 2/9 08/21/2016 07/30/2015 06/21/2015 04/29/2015 03/18/2015 02/04/2015  Decreased Interest 0 0 0 0 0 0  Down, Depressed, Hopeless 0 0 0 1 1 1   PHQ - 2 Score 0 0 0 1 1 1   Some recent data might be hidden     Review of Systems  Constitutional: Negative.   HENT: Negative.   Eyes: Negative.   Respiratory: Negative.   Cardiovascular: Negative.   Gastrointestinal: Positive for constipation.  Endocrine: Negative.   Genitourinary: Negative.   Musculoskeletal: Negative.   Skin: Negative.   Allergic/Immunologic: Negative.   Neurological: Negative.   Hematological: Negative.  Psychiatric/Behavioral: Negative.   All other systems reviewed and are negative.      Objective:   Physical Exam  Constitutional: She appears well-developed and well-nourished.  HENT:  Head: Normocephalic and atraumatic.  Eyes: Pupils are equal, round, and reactive to light. Conjunctivae and EOM are normal.  Musculoskeletal:  There is mild pain with hip adduction and abduction. No pain with knee or ankle range of motion on the right side. No pain with right hand, wrist or elbow range of motion on the right side. Mild pain with shoulder range of motion.  Neurological:  Motor strength is 0/5 in the right deltoid, biceps, triceps, grip as well as flexion and extensor). Manual muscle testing is difficult due to communication deficits as well. She is  nonambulatory.  Psychiatric: Her affect is inappropriate. Her speech is delayed and slurred. She is slowed. She is noncommunicative.  Severely aphasic and apraxic moves her head as we move her arm in the same direction  Cannot assess cognition, memory, judgment, secondary to severe aphasia She is inattentive.  Nursing note and vitals reviewed.         Assessment & Plan:  1.  Right Spastic hemiparesis   Dysport Pronator teres 200 FDP 100 FDS 106 Opponents pollicis 269  2.  Right hip pain mainly with when in bed.  No sig pain in WC, leans to right in [facility], will try padding or bolster , discussed Xray, But will not pursue due to problems getting her on and off and x-ray table

## 2016-09-09 ENCOUNTER — Other Ambulatory Visit: Payer: Self-pay

## 2016-09-09 ENCOUNTER — Encounter: Payer: Self-pay | Admitting: Internal Medicine

## 2016-09-09 ENCOUNTER — Ambulatory Visit (INDEPENDENT_AMBULATORY_CARE_PROVIDER_SITE_OTHER): Payer: Medicare Other | Admitting: Internal Medicine

## 2016-09-09 VITALS — BP 90/54 | HR 83 | Temp 98.0°F | Resp 16

## 2016-09-09 DIAGNOSIS — E0590 Thyrotoxicosis, unspecified without thyrotoxic crisis or storm: Secondary | ICD-10-CM | POA: Diagnosis not present

## 2016-09-09 DIAGNOSIS — I10 Essential (primary) hypertension: Secondary | ICD-10-CM

## 2016-09-09 DIAGNOSIS — M7989 Other specified soft tissue disorders: Secondary | ICD-10-CM | POA: Diagnosis not present

## 2016-09-09 DIAGNOSIS — Z23 Encounter for immunization: Secondary | ICD-10-CM

## 2016-09-09 DIAGNOSIS — E042 Nontoxic multinodular goiter: Secondary | ICD-10-CM | POA: Diagnosis not present

## 2016-09-09 DIAGNOSIS — I82403 Acute embolism and thrombosis of unspecified deep veins of lower extremity, bilateral: Secondary | ICD-10-CM

## 2016-09-09 MED ORDER — HYDROXYUREA 500 MG PO CAPS: 500.0000 mg | ORAL_CAPSULE | Freq: Every day | ORAL | Status: DC

## 2016-09-09 NOTE — Assessment & Plan Note (Signed)
Following with Dr. Cruzita Lederer Taking methimazole

## 2016-09-09 NOTE — Assessment & Plan Note (Signed)
Blood pressure on low side, but typically higher Visiting nurse will monitor it Continue current medication for now-taking very low-dose propranolol mostly for migraines-most likely minimally decreasing blood pressure Continue to push fluids-she tends not to drink much

## 2016-09-09 NOTE — Assessment & Plan Note (Signed)
Significant swelling of the left leg compared to the right leg Related to prior DVT Intolerant of compression socks Currently not on diuretic-but may need low-dose We'll see vascular for options regarding wrapping the leg

## 2016-09-09 NOTE — Assessment & Plan Note (Signed)
Left leg much more than right leg Not tolerant of compression hose DVT occurred while and helpless so is currently on warfarin Will see vascular regarding her leg swelling

## 2016-09-09 NOTE — Assessment & Plan Note (Signed)
Will be evaluated by surgery if she is a surgical candidate. If she is not Endocrine may consider another try radioactive iodine

## 2016-09-09 NOTE — Progress Notes (Signed)
Subjective:    Patient ID: Sandy Barrett, female    DOB: 1932-04-03, 83 y.o.   MRN: [id number]  HPI The patient is here for follow up.  Hypertension: She is taking her medication daily. She is compliant with a low sodium diet.  She still has a visiting nurse who also checks her blood pressure.  Goiter, hyperthyroidism:  She is following with Endocrine.  She will see surgery to be evaluated for surgery.  If she is not a surgical candidate trying the radioactive iodine again may be a possibility.  History of stroke, aphasia, spastic hemiparesis of the right side:  She is taking warfarin daily as prescribed.  History of DVT b/l LE with edema: she is not using compression hose-she does not tolerate it and will not let her daughter but the mom. She is taking warfarin.  Her swelling decreases slightly overnight.  Overall the swelling in both legs is better, but the swelling in the left leg is still significantly more than the right leg.  Pressure ulcer:  the ulcer has almost completely healed.   Her daughter monitors this.    Medications and allergies reviewed with patient and updated if appropriate.  Patient Active Problem List   Diagnosis Date Noted  . Cough 08/23/2016  . Wheezing 08/23/2016  . Left hip pain 08/09/2016  . Yeast infection of the skin 07/01/2016  . Hematoma of abdominal wall, sequela 06/06/2016  . Abnormal stool color 05/18/2016  . Left leg swelling 05/18/2016  . Heat rash 05/04/2016  . Encounter for hospice care discussion   . Pressure injury of skin 04/27/2016  . Aspiration pneumonia ([place]) 04/20/2016  . Hemidiaphragm paralysis   . Wheelchair bound 02/03/2016  . Conjunctival hemorrhage of right eye 01/28/2016  . Ecchymosis 01/28/2016  . Vaginal bleeding 01/28/2016  . Gross hematuria 01/28/2016  . Supratherapeutic INR 01/28/2016  . Leg swelling 12/31/2015  . Discoloration of skin 09/26/2015  . Dermatitis 09/13/2015  . Essential thrombocytosis ([place])  07/31/2015  . SVC (superior vena cava obstruction), chronic 07/20/2015  . Dysphagia 07/20/2015  . Elevated liver enzymes 07/20/2015  . Constipation 07/20/2015  . Thrombophlebitis of breast, right 06/18/2015  . Tracheal deviation 06/06/2015  . Sensorineural hearing loss, bilateral, moderate-moderately severe 04/30/2015  . Abdominal wall lump 03/09/2015  . Frequent UTI 02/06/2015  . Primary osteoarthritis involving multiple joints 07/26/2014  . Pernicious anemia 07/26/2014  . Hearing loss 07/26/2014  . Encounter for therapeutic drug monitoring 07/17/2014  . Spastic hemiparesis affecting dominant side ([place]), right 05/31/2014  . DVT of lower extremity, bilateral ([place]) 03/14/2014  . Global aphasia 03/14/2014  . 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 Aphasia S/P CVA 03/13/2014  . Cerebral infarction due to embolism of left middle cerebral artery ([place])   . Stroke, embolic ([place]) 86/76/1950  . History of pulmonary embolism 03/08/2014  . Back pain 08/29/2013  . Primary localized osteoarthrosis, lower leg 03/06/2013  . IBS (irritable bowel syndrome)   . Multinodular goiter 01/13/2011  . Hyperthyroidism 11/11/2009  . Insomnia 07/03/2009  . POSTTRAUMATIC STRESS DISORDER 08/22/2008  . OBESITY 04/21/2008  . Osteoarthritis 04/21/2008  . MIGRAINE HEADACHE 04/20/2008  . Essential hypertension 04/20/2008    Current Outpatient Prescriptions on File Prior to Visit  Medication Sig Dispense Refill  . acetaminophen (TYLENOL) 160 MG/5ML liquid Take 480 mg by mouth every 4 (four) hours as needed for fever or pain.    [name] albuterol (PROVENTIL HFA) 108 (90 Base) MCG/ACT inhaler Inhale 2 puffs into the lungs  every 6 (six) hours as needed for wheezing or shortness of breath. 1 Inhaler 1  . diclofenac sodium (VOLTAREN) 1 % GEL APPLY (2GMS) TOPICALLY THREE TIMES DAILY. 300 g 5  . gabapentin (NEURONTIN) 100 MG capsule Take 1 capsule (100 mg total) by mouth 2 (two) times daily. 60 capsule 2  . guaiFENesin  (MUCINEX) 600 MG 12 hr tablet Take 1 tablet (600 mg total) by mouth 2 (two) times daily. (Patient taking differently: Take 600 mg by mouth 2 (two) times daily as needed for cough or to loosen phlegm. ) 10 tablet 0  . hydroxyurea (HYDREA) 500 MG capsule Take 2 capsules (1,000 mg total) by mouth daily. May take with food to minimize GI side effects. (Patient taking differently: Take 500 mg by mouth daily. ) 60 capsule 9  . ipratropium-albuterol (DUONEB) 0.5-2.5 (3) MG/3ML SOLN Take 3 mLs by nebulization every 6 (six) hours as needed. (Patient taking differently: Take 3 mLs by nebulization every 6 (six) hours as needed (shortness of breath). ) 360 mL 0  . methimazole (TAPAZOLE) 5 MG tablet TAKE 1 TABLET BY MOUTH TWICE WEEKLY (Patient taking differently: TAKE 1 TABLET BY MOUTH TWICE WEEKLY (Sun and Thurs)) 12 tablet 2  . nystatin (MYCOSTATIN/NYSTOP) powder Apply topically 3 (three) times daily. 30 g 0  . polyethylene glycol (MIRALAX / GLYCOLAX) packet TAKE 17G BY MOUTH TWICE A DAY (Patient taking differently: TAKE 17G BY MOUTH TWICE A DAY AS NEED FOR CONSTIPATION) 14 packet 1  . Probiotic Product (ALIGN) 4 MG CAPS Take 1 capsule by mouth daily.     . propranolol (INDERAL) 10 MG tablet Take 0.5 tablets (5 mg total) by mouth 2 (two) times daily. 60 tablet 5  . PROTONIX 40 MG PACK TAKE 20 MLS (40 MG TOTAL) BY MOUTH DAILY. (Patient taking differently: Take 40 mg by mouth daily. Mix with applesauce) 30 each 5  . terconazole (TERAZOL 7) 0.4 % vaginal cream Place 1 applicator vaginally at bedtime. (Patient taking differently: Place 1 applicator vaginally at bedtime as needed (irritation). ) 90 g 3  . traMADol (ULTRAM) 50 MG tablet TAKE 2 TABLETS BY MOUTH 3 TIMES A DAY AS NEEDED 180 tablet 1  . traZODone (DESYREL) 50 MG tablet TAKE 1 TABLET (50 MG TOTAL) BY MOUTH AT BEDTIME AS NEEDED. FOR SLEEP (Patient taking differently: Take 50 mg by mouth at bedtime. for sleep) 30 tablet 5  . triamcinolone ointment (KENALOG) 0.5  % Apply 1 application topically 2 (two) times daily. 30 g 0  . warfarin (COUMADIN) 2 MG tablet Take 2 tablets (4 mg total) by mouth one time only at 6 PM. 60 tablet 1   No current facility-administered medications on file prior to visit.     Past Medical History:  Diagnosis Date  . ARTHRITIS   . Arthritis   . Diverticulosis   . HYPERTENSION   . HYPERTHYROIDISM   . Hyperthyroidism    s/p I-131 ablation 03/2011 of multinod goiter  . INSOMNIA   . MIGRAINE HEADACHE   . OBESITY   . Posttraumatic stress disorder   . Pulmonary embolism ([place]) 03/2014   with DVT  . Stroke [facility]) 03/2014   dysarthria    Past Surgical History:  Procedure Laterality Date  . ABDOMINAL HYSTERECTOMY  1976  . BREAST SURGERY     biopsy  . RADIOLOGY WITH ANESTHESIA Left 03/08/2014   Procedure: RADIOLOGY WITH ANESTHESIA;  Surgeon: Rob Hickman, MD;  Location: [place];  Service: Radiology;  Laterality: Left;  .  TONSILLECTOMY    . TOTAL HIP ARTHROPLASTY  1998    right    Social History   Social History  . Marital status: Widowed    Spouse name: N/A  . Number of children: 2  . Years of education: 14   Occupational History  . retired     Restaurant manager, fast food   Social History Main Topics  . Smoking status: Never Smoker  . Smokeless tobacco: Never Used  . Alcohol use No  . Drug use: No  . Sexual activity: No   Other Topics Concern  . Not on file   Social History Narrative   Widowed, lived alone prior to CVA 03/2014   SNF at [facility], then home 07/2014 with dtr   Right handed   Caffeine use- occasionally drinks tea    Family History  Problem Relation Age of Onset  . Asthma Mother   . Asthma Father   . Prostate cancer Father   . Stroke Brother   . Breast cancer Sister   . Stomach cancer Sister   . Thyroid disease Neg Hx     Review of Systems  Respiratory: Positive for cough (little) and choking (once yesterday - when drinking). Negative for shortness of breath and wheezing.     Cardiovascular: Positive for leg swelling.  Gastrointestinal: Positive for constipation.  Neurological: Positive for light-headedness.       Objective:   Vitals:   09/09/16 1341  BP: (!) 90/54  Pulse: 83  Resp: 16  Temp: 98 F (36.7 C)  SpO2: 95%   Wt Readings from Last 3 Encounters:  05/19/16 154 lb 4.8 oz (70 kg)  04/29/16 164 lb 12.8 oz (74.8 kg)  04/13/16 176 lb (79.8 kg)   There is no height or weight on file to calculate BMI.   Physical Exam    Constitutional: Appears well-developed and well-nourished. No distress.  HENT:  Head: Normocephalic and atraumatic.  Neck: Neck supple. No tracheal deviation present. No thyromegaly present-thyromegaly is internal.  No cervical lymphadenopathy Cardiovascular: Normal rate, regular rhythm and normal heart sounds.   2/6 systolic murmur heard. No carotid bruit .  1+ pitting edema right lower extremity, 3+ pitting edema left lower extremity Pulmonary/Chest: Effort normal and breath sounds normal. No respiratory distress. No has no wheezes. No rales.  Skin: Skin is warm and dry. Not diaphoretic.  Psychiatric: Normal mood and affect. Behavior is normal.      Assessment & Plan:    See Problem List for Assessment and Plan of chronic medical problems.

## 2016-09-09 NOTE — Patient Instructions (Signed)
   Flu immunization administered today.    Medications reviewed and updated.  No changes recommended at this time.    Please followup in 6 months   

## 2016-09-10 ENCOUNTER — Ambulatory Visit (INDEPENDENT_AMBULATORY_CARE_PROVIDER_SITE_OTHER): Payer: Medicare Other | Admitting: General Practice

## 2016-09-10 ENCOUNTER — Telehealth: Payer: Self-pay | Admitting: Internal Medicine

## 2016-09-10 ENCOUNTER — Encounter: Payer: Self-pay | Admitting: Internal Medicine

## 2016-09-10 DIAGNOSIS — L89322 Pressure ulcer of left buttock, stage 2: Secondary | ICD-10-CM | POA: Diagnosis not present

## 2016-09-10 DIAGNOSIS — R1310 Dysphagia, unspecified: Secondary | ICD-10-CM | POA: Diagnosis not present

## 2016-09-10 DIAGNOSIS — I69391 Dysphagia following cerebral infarction: Secondary | ICD-10-CM | POA: Diagnosis not present

## 2016-09-10 DIAGNOSIS — N390 Urinary tract infection, site not specified: Secondary | ICD-10-CM | POA: Diagnosis not present

## 2016-09-10 DIAGNOSIS — Z7901 Long term (current) use of anticoagulants: Secondary | ICD-10-CM | POA: Diagnosis not present

## 2016-09-10 DIAGNOSIS — I69320 Aphasia following cerebral infarction: Secondary | ICD-10-CM | POA: Diagnosis not present

## 2016-09-10 DIAGNOSIS — Z5181 Encounter for therapeutic drug level monitoring: Secondary | ICD-10-CM | POA: Diagnosis not present

## 2016-09-10 DIAGNOSIS — I69351 Hemiplegia and hemiparesis following cerebral infarction affecting right dominant side: Secondary | ICD-10-CM | POA: Diagnosis not present

## 2016-09-10 DIAGNOSIS — I10 Essential (primary) hypertension: Secondary | ICD-10-CM | POA: Diagnosis not present

## 2016-09-10 LAB — POCT INR: INR: 1.5

## 2016-09-10 NOTE — Patient Instructions (Signed)
Pre visit review using our clinic review tool, if applicable. No additional management support is needed unless otherwise documented below in the visit note. 

## 2016-09-10 NOTE — Progress Notes (Signed)
I have reviewed and agree with the plan. 

## 2016-09-10 NOTE — Telephone Encounter (Signed)
Needs verbals to draw labs for  PT INR and UA Please call back

## 2016-09-10 NOTE — Telephone Encounter (Signed)
Noted.  Orders called to Mardene Celeste, RN @ [facility].

## 2016-09-10 NOTE — Telephone Encounter (Signed)
Spoke with Fraser Din from [facility] to give verbal orders to do UA and INR today instead of tomorrow per MD

## 2016-09-10 NOTE — Telephone Encounter (Signed)
PT - 18.3 INR - 1.5

## 2016-09-11 ENCOUNTER — Other Ambulatory Visit: Payer: Self-pay | Admitting: Internal Medicine

## 2016-09-11 ENCOUNTER — Telehealth: Payer: Self-pay | Admitting: *Deleted

## 2016-09-11 ENCOUNTER — Encounter: Payer: Medicare Other | Admitting: Vascular Surgery

## 2016-09-11 ENCOUNTER — Other Ambulatory Visit: Payer: Self-pay | Admitting: Hematology and Oncology

## 2016-09-11 ENCOUNTER — Encounter (HOSPITAL_COMMUNITY): Payer: Medicare Other

## 2016-09-11 DIAGNOSIS — I890 Lymphedema, not elsewhere classified: Secondary | ICD-10-CM

## 2016-09-11 MED ORDER — HYDROXYUREA 500 MG PO CAPS: ORAL_CAPSULE | ORAL | 3 refills | Status: DC

## 2016-09-11 NOTE — Telephone Encounter (Signed)
I have placed order

## 2016-09-11 NOTE — Telephone Encounter (Signed)
Pt is requesting a referral to the [facility] lymphedema clinic.

## 2016-09-11 NOTE — Telephone Encounter (Signed)
Pt left a message stating she is having trouble getting a refill of her hydrea.

## 2016-09-12 ENCOUNTER — Other Ambulatory Visit: Payer: Self-pay | Admitting: Internal Medicine

## 2016-09-14 DIAGNOSIS — E049 Nontoxic goiter, unspecified: Secondary | ICD-10-CM | POA: Diagnosis not present

## 2016-09-15 ENCOUNTER — Telehealth: Payer: Self-pay | Admitting: Internal Medicine

## 2016-09-15 MED ORDER — AMOXICILLIN-POT CLAVULANATE 875-125 MG PO TABS: 1.0000 | ORAL_TABLET | Freq: Two times a day (BID) | ORAL | 0 refills | Status: DC

## 2016-09-15 NOTE — Telephone Encounter (Signed)
Urine test shows an infection - we need to start an antibiotic. We will recheck urine only if she has symptoms.    Antibiotic - augmentin sent to [facility].

## 2016-09-15 NOTE — Telephone Encounter (Signed)
Spoke with pts daughter to inform.  

## 2016-09-16 ENCOUNTER — Ambulatory Visit (HOSPITAL_COMMUNITY)
Admission: RE | Admit: 2016-09-16 | Discharge: 2016-09-16 | Disposition: A | Discharge status: Home / Self Care | Payer: Medicare Other | Source: Ambulatory Visit | Attending: Vascular Surgery | Admitting: Vascular Surgery

## 2016-09-16 ENCOUNTER — Ambulatory Visit: Payer: Medicare Other | Admitting: Podiatry

## 2016-09-16 ENCOUNTER — Inpatient Hospital Stay (HOSPITAL_COMMUNITY): Admission: RE | Admit: 2016-09-16 | Payer: Medicare Other | Source: Ambulatory Visit

## 2016-09-16 DIAGNOSIS — M7989 Other specified soft tissue disorders: Secondary | ICD-10-CM

## 2016-09-16 NOTE — Progress Notes (Signed)
**  Preliminary report by tech**  Inferior vena cava and iliac vein duplex complete. There is no obvious evidence of deep vein thrombosis involving the visualized portions of the inferior vena cava, common iliac veins, or common femoral veins bilaterally.  Unable to perform compressions due to patient pain tolerance, and patient positioning.  09/16/16 3:41 PM Sandy Barrett RVT

## 2016-09-17 ENCOUNTER — Ambulatory Visit (INDEPENDENT_AMBULATORY_CARE_PROVIDER_SITE_OTHER): Payer: Medicare Other | Admitting: General Practice

## 2016-09-17 DIAGNOSIS — I69351 Hemiplegia and hemiparesis following cerebral infarction affecting right dominant side: Secondary | ICD-10-CM | POA: Diagnosis not present

## 2016-09-17 DIAGNOSIS — Z5181 Encounter for therapeutic drug level monitoring: Secondary | ICD-10-CM

## 2016-09-17 DIAGNOSIS — I69391 Dysphagia following cerebral infarction: Secondary | ICD-10-CM | POA: Diagnosis not present

## 2016-09-17 DIAGNOSIS — I69320 Aphasia following cerebral infarction: Secondary | ICD-10-CM | POA: Diagnosis not present

## 2016-09-17 DIAGNOSIS — Z7901 Long term (current) use of anticoagulants: Secondary | ICD-10-CM | POA: Diagnosis not present

## 2016-09-17 DIAGNOSIS — I10 Essential (primary) hypertension: Secondary | ICD-10-CM | POA: Diagnosis not present

## 2016-09-17 DIAGNOSIS — L89322 Pressure ulcer of left buttock, stage 2: Secondary | ICD-10-CM | POA: Diagnosis not present

## 2016-09-17 DIAGNOSIS — R1310 Dysphagia, unspecified: Secondary | ICD-10-CM | POA: Diagnosis not present

## 2016-09-17 LAB — POCT INR: INR: 2.2

## 2016-09-17 NOTE — Patient Instructions (Signed)
Pre visit review using our clinic review tool, if applicable. No additional management support is needed unless otherwise documented below in the visit note. 

## 2016-09-17 NOTE — Progress Notes (Signed)
I have reviewed and agree with the plan. 

## 2016-09-21 ENCOUNTER — Other Ambulatory Visit: Payer: Self-pay | Admitting: Internal Medicine

## 2016-09-21 ENCOUNTER — Ambulatory Visit: Payer: Medicare Other | Admitting: Podiatry

## 2016-09-21 NOTE — Telephone Encounter (Signed)
RX faxed to [facility] 

## 2016-09-21 NOTE — Telephone Encounter (Signed)
[place] Controlled Substance Database checked. Last filled on 08/20/16

## 2016-09-21 NOTE — Telephone Encounter (Signed)
Ok to fill 

## 2016-09-22 ENCOUNTER — Ambulatory Visit (INDEPENDENT_AMBULATORY_CARE_PROVIDER_SITE_OTHER): Payer: Medicare Other | Admitting: General Practice

## 2016-09-22 DIAGNOSIS — Z7901 Long term (current) use of anticoagulants: Secondary | ICD-10-CM

## 2016-09-22 DIAGNOSIS — L89322 Pressure ulcer of left buttock, stage 2: Secondary | ICD-10-CM | POA: Diagnosis not present

## 2016-09-22 DIAGNOSIS — I69351 Hemiplegia and hemiparesis following cerebral infarction affecting right dominant side: Secondary | ICD-10-CM | POA: Diagnosis not present

## 2016-09-22 DIAGNOSIS — R1310 Dysphagia, unspecified: Secondary | ICD-10-CM | POA: Diagnosis not present

## 2016-09-22 DIAGNOSIS — I10 Essential (primary) hypertension: Secondary | ICD-10-CM | POA: Diagnosis not present

## 2016-09-22 DIAGNOSIS — I69391 Dysphagia following cerebral infarction: Secondary | ICD-10-CM | POA: Diagnosis not present

## 2016-09-22 DIAGNOSIS — I69320 Aphasia following cerebral infarction: Secondary | ICD-10-CM | POA: Diagnosis not present

## 2016-09-22 LAB — POCT INR: INR: 1.81

## 2016-09-22 NOTE — Patient Instructions (Signed)
Pre visit review using our clinic review tool, if applicable. No additional management support is needed unless otherwise documented below in the visit note. 

## 2016-09-24 NOTE — Progress Notes (Signed)
Agree with management of INR/warfarin.   [name], MD

## 2016-09-29 ENCOUNTER — Ambulatory Visit: Payer: Medicare Other | Attending: Hematology and Oncology | Admitting: Physical Therapy

## 2016-09-29 ENCOUNTER — Ambulatory Visit (INDEPENDENT_AMBULATORY_CARE_PROVIDER_SITE_OTHER): Payer: Medicare Other | Admitting: General Practice

## 2016-09-29 DIAGNOSIS — I10 Essential (primary) hypertension: Secondary | ICD-10-CM | POA: Diagnosis not present

## 2016-09-29 DIAGNOSIS — L89322 Pressure ulcer of left buttock, stage 2: Secondary | ICD-10-CM | POA: Diagnosis not present

## 2016-09-29 DIAGNOSIS — R1310 Dysphagia, unspecified: Secondary | ICD-10-CM | POA: Diagnosis not present

## 2016-09-29 DIAGNOSIS — R600 Localized edema: Secondary | ICD-10-CM

## 2016-09-29 DIAGNOSIS — I890 Lymphedema, not elsewhere classified: Secondary | ICD-10-CM | POA: Insufficient documentation

## 2016-09-29 DIAGNOSIS — I69391 Dysphagia following cerebral infarction: Secondary | ICD-10-CM | POA: Diagnosis not present

## 2016-09-29 DIAGNOSIS — Z7901 Long term (current) use of anticoagulants: Secondary | ICD-10-CM | POA: Diagnosis not present

## 2016-09-29 DIAGNOSIS — I69320 Aphasia following cerebral infarction: Secondary | ICD-10-CM | POA: Diagnosis not present

## 2016-09-29 DIAGNOSIS — I69351 Hemiplegia and hemiparesis following cerebral infarction affecting right dominant side: Secondary | ICD-10-CM | POA: Diagnosis not present

## 2016-09-29 LAB — POCT INR: INR: 1.8

## 2016-09-29 NOTE — Patient Instructions (Signed)
Pre visit review using our clinic review tool, if applicable. No additional management support is needed unless otherwise documented below in the visit note. 

## 2016-09-29 NOTE — Therapy (Addendum)
[place], [place], [address] Phone: [phone]   Fax:  [phone]  Physical Therapy Evaluation  Patient Details  Name: Sandy Barrett MRN: [id number] Date of Birth: Jul 06, 1932 Referring Provider: Dr. Heath Lark  Encounter Date: 09/29/2016      PT End of Session - 09/29/16 1726    Visit Number 1   Number of Visits 9   Date for PT Re-Evaluation 11/13/16   PT Start Time 3212   PT Stop Time 1548   PT Time Calculation (min) 74 min   Activity Tolerance Patient tolerated treatment well   Behavior During Therapy [facility] for tasks assessed/performed      Past Medical History:  Diagnosis Date  . ARTHRITIS   . Arthritis   . Diverticulosis   . HYPERTENSION   . HYPERTHYROIDISM   . Hyperthyroidism    s/p I-131 ablation 03/2011 of multinod goiter  . INSOMNIA   . MIGRAINE HEADACHE   . OBESITY   . Posttraumatic stress disorder   . Pulmonary embolism ([place]) 03/2014   with DVT  . Stroke [facility]) 03/2014   dysarthria    Past Surgical History:  Procedure Laterality Date  . ABDOMINAL HYSTERECTOMY  1976  . BREAST SURGERY     biopsy  . RADIOLOGY WITH ANESTHESIA Left 03/08/2014   Procedure: RADIOLOGY WITH ANESTHESIA;  Surgeon: Rob Hickman, MD;  Location: [place];  Service: Radiology;  Laterality: Left;  . TONSILLECTOMY    . TOTAL HIP ARTHROPLASTY  1998    right    There were no vitals filed for this visit.       Subjective Assessment - 09/29/16 1442    Subjective The dermatologist noticed a swelling in her right breast. that's when she told me about the lymphedema drainage massage and compression garments. Had compression knee-highs that daughter says wasn't terribly hard to get on, and they did it for a long time, but pt. would become tearful about putting it on.   Patient is accompained by: Family member  daughter [name]   Pertinent History Has an appointment with the vein and vascular center next  month about leg swelling. Had Doppler last week but haven't had the explained by a doctor yet. Leg swelling started April or May this year. Stroke was in March 2016. Right breast swelling started a couple months after that. No cancer history.  Has some swelling in right arm that is variable; breast swelling varies a bit too, but got better with prednisone. Has arthritis.   Patient Stated Goals get some help with breast and/or leg swelling   Currently in Pain? Yes   Pain Score --  not rated   Pain Location Knee  and generalized   Pain Orientation Left   Aggravating Factors  moving different body parts   Pain Relieving Factors tramadol and stretches            [facility] PT Assessment - 09/29/16 0001      Assessment   Medical Diagnosis right breast and left leg swelling   Referring Provider Dr. Heath Lark   Onset Date/Surgical Date 06/06/14  approximate   Prior Therapy none now; was getting home health with [facility] about 1-2 months ago (mainly speech therapy; OT and PT just once.  Still getting nursing until 10/9.     Precautions   Precautions Other (comment)   Precaution Comments aphasic     Restrictions   Weight Bearing Restrictions No     Home Environment  Living Environment Private residence   Living Arrangements Children  daughter [name]   Available Help at Discharge --  two daughters and now two aides   Type of [place] entrance   [place] lift     Prior Function   Level of [place] Needs assistance with ADLs;Needs assistance with homemaking     Cognition   Overall Cognitive Status Difficult to assess  due to aphasia; significant expressive aphasia, no words     Observation/Other Assessments   Observations Pt. is in a motorized wheelchair with right arm in trough for support. She has significant breast swelling on right compared to left breast. Her left leg is significantly swollen  and right appears atrophied. (Daughter says she has no function on that side.)  Right breast edema is pitting.   Other Surveys  --  lymph life impact scale score of 49 = 72% impairment     Posture/Postural Control   Posture/Postural Control --  erect in wheelchair     ROM / Strength   AROM / PROM / Strength AROM     AROM   Overall AROM Comments right UE appears flaccid; no active movement noted in right leg today as she was sitting in the wheelchair     Palpation   Palpation comment Right breast is indurated and heavy compared to left.     Transfers   Transfers Not assessed   Transfer via Art therapist  per patient's daughter     Ambulation/Gait   Ambulation/Gait No   Gait Comments no Paediatric nurse Yes   Wheelchair Assistance 1: +1 Total assist  daughter maneuvers motorized wheelchair for her           LYMPHEDEMA/ONCOLOGY QUESTIONNAIRE - 09/29/16 1721      Type   Cancer Type no cancer; has blood disorder producing too many platelets, so has had a stroke     Treatment   Past Chemotherapy Treatment No   Past Radiation Treatment No     What other symptoms do you have   Are you having pitting edema Yes   Body Site right breast     Lymphedema Assessments   Lymphedema Assessments Lower extremities  + see photograph for right breast swelling            Objective measurements completed on examination: See above findings.                  PT Education - 09/29/16 1724    Education provided Yes   Education Details Educated patient and her daughter, [name], about treatment options including manual lymph drainage for right breast and left leg, possible pump for both, about compression bras (briefly) and velcro garments such as Circaid reduction kit or Juzo compression wrap vs. bandaging option   Person(s) Educated Patient;Child(ren)   Methods Explanation;Handout   Comprehension Verbalized  understanding                Long Term Clinic Goals - 09/29/16 1740      CC Long Term Goal  #1   Title Patient's daughter will be knowledgeable about manual lymph drainage for right breast swelling and possibly left leg swelling.   Time 4   Period Weeks   Status New     CC Long Term Goal  #2   Title Patient's daughter will  be knowledgeable about appropriate compression bras.   Time 3   Period Weeks   Status New     CC Long Term Goal  #3   Title Patient's daughter will be knowledgeable about bandage-alternative garments for left leg swelling.   Time 4   Period Weeks   Status New     CC Long Term Goal  #4   Title Patient's daughter will be knowledgeable about availability of pump for edema management.   Time 4   Period Weeks   Status New             Plan - 09/29/16 1728    Clinical Impression Statement This is a pleasant woman accompanied by her daughter [name] today.  Pt. had a stroke 2 years ago leaving her with right hemiplegia and aphasia.  She produced no speech today; difficult to assess how much she comprehended. She has had right breast swelling since a couple of months after the stroke.  She also has left leg swelling.  Both are significant.  Left leg measurements were not yet taken; patient is to see a vascular specialist about this in about a month.    History and Personal Factors relevant to plan of care: aphasia, right hemiplegia, clotting disorder, dependent for ADLs. limited mobility and requires Hoyer lift for transfers   Clinical Presentation Unstable   Clinical Presentation due to: more recent onset of left leg swelling; clotting disorder that may have caused stroke and left leg swelling   Clinical Decision Making High   Rehab Potential Good   Clinical Impairments Affecting Rehab Potential aphasia, right hemiplegia, significantly decreased mobility and transfers with a Hoyer lift   PT Frequency 2x / week   PT Duration 2 weeks  to 4 weeks as  needed   PT Treatment/Interventions ADLs/Self Care Home Management;Manual lymph drainage;Compression bandaging;Manual techniques;Patient/family education;DME Instruction;Orthotic Fit/Training   PT Next Visit Plan Begin with instruction to daughter in manual lymph drainage for right breast and in suggestions for compression or sports bras. Later, work with daughter to obtain velcro compression garment for left leg (unless something else is suggested by visit to vascular MD) and in manual lymph drainage for same.  Also discuss possiblity of acquiring a pump for both areas.   Consulted and Agree with Plan of Care Patient;Family member/caregiver      Patient will benefit from skilled therapeutic intervention in order to improve the following deficits and impairments:  Increased edema, Decreased knowledge of precautions, Decreased knowledge of use of DME  Visit Diagnosis: Lymphedema, not elsewhere classified - Plan: PT plan of care cert/re-cert  Localized edema - Plan: PT plan of care cert/re-cert      G-Codes - 16/10/96 1742    Functional Assessment Tool Used (Outpatient Only) lymphedema life impact scale   Functional Limitation Other PT primary  lymphedema management   Other PT Primary Current Status (E4540) At least 60 percent but less than 80 percent impaired, limited or restricted   Other PT Primary Goal Status (J8119) At least 20 percent but less than 40 percent impaired, limited or restricted       Problem List Patient Active Problem List   Diagnosis Date Noted  . Acquired lymphedema 09/11/2016  . Long term (current) use of anticoagulants [Z79.01] 09/10/2016  . Cough 08/23/2016  . Left hip pain 08/09/2016  . Yeast infection of the skin 07/01/2016  . Hematoma of abdominal wall, sequela 06/06/2016  . Abnormal stool color 05/18/2016  . Left leg swelling 05/18/2016  .  Heat rash 05/04/2016  . Encounter for hospice care discussion   . Pressure injury of skin 04/27/2016  .  Aspiration pneumonia ([place]) 04/20/2016  . Hemidiaphragm paralysis   . Wheelchair bound 02/03/2016  . Conjunctival hemorrhage of right eye 01/28/2016  . Ecchymosis 01/28/2016  . Vaginal bleeding 01/28/2016  . Gross hematuria 01/28/2016  . Discoloration of skin 09/26/2015  . Dermatitis 09/13/2015  . Essential thrombocytosis ([place]) 07/31/2015  . SVC (superior vena cava obstruction), chronic 07/20/2015  . Dysphagia 07/20/2015  . Elevated liver enzymes 07/20/2015  . Constipation 07/20/2015  . Thrombophlebitis of breast, right 06/18/2015  . Tracheal deviation 06/06/2015  . Sensorineural hearing loss, bilateral, moderate-moderately severe 04/30/2015  . Abdominal wall lump 03/09/2015  . Frequent UTI 02/06/2015  . Primary osteoarthritis involving multiple joints 07/26/2014  . Pernicious anemia 07/26/2014  . Hearing loss 07/26/2014  . Encounter for therapeutic drug monitoring 07/17/2014  . Spastic hemiparesis affecting dominant side ([place]), right 05/31/2014  . DVT of lower extremity, bilateral ([place]) 03/14/2014  . Global aphasia 03/14/2014  . 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 Aphasia S/P CVA 03/13/2014  . Cerebral infarction due to embolism of left middle cerebral artery ([place])   . Stroke, embolic ([place]) 58/09/9831  . History of pulmonary embolism 03/08/2014  . Back pain 08/29/2013  . Primary localized osteoarthrosis, lower leg 03/06/2013  . IBS (irritable bowel syndrome)   . Multinodular goiter 01/13/2011  . Hyperthyroidism 11/11/2009  . Insomnia 07/03/2009  . POSTTRAUMATIC STRESS DISORDER 08/22/2008  . OBESITY 04/21/2008  . Osteoarthritis 04/21/2008  . MIGRAINE HEADACHE 04/20/2008  . Essential hypertension 04/20/2008    SALISBURY,DONNA 09/29/2016, 5:44 PM  [place], [place], [address] Phone: [phone]   Fax:  [phone]  Name: Sandy Barrett MRN: [id number] Date of Birth: Jul 24, 1932  Serafina Royals, PT 09/29/16 5:45 PM  Serafina Royals, PT 10/01/16 4:54 PM

## 2016-10-01 ENCOUNTER — Ambulatory Visit: Payer: Medicare Other | Admitting: Physical Therapy

## 2016-10-01 ENCOUNTER — Encounter: Payer: Self-pay | Admitting: Physical Therapy

## 2016-10-01 DIAGNOSIS — I890 Lymphedema, not elsewhere classified: Secondary | ICD-10-CM

## 2016-10-01 DIAGNOSIS — R600 Localized edema: Secondary | ICD-10-CM | POA: Diagnosis not present

## 2016-10-01 NOTE — Therapy (Signed)
[place], [place], [address] Phone: [phone]   Fax:  [phone]  Physical Therapy Treatment  Patient Details  Name: Sandy Barrett MRN: [id number] Date of Birth: [birth year] Referring Provider: Dr. Heath Lark  Encounter Date: 10/01/2016      PT End of Session - 10/01/16 1541    Visit Number 2   Number of Visits 9   Date for PT Re-Evaluation 11/13/16   PT Start Time 1433   PT Stop Time 1520   PT Time Calculation (min) 47 min   Activity Tolerance Patient tolerated treatment well   Behavior During Therapy [facility] for tasks assessed/performed      Past Medical History:  Diagnosis Date  . ARTHRITIS   . Arthritis   . Diverticulosis   . HYPERTENSION   . HYPERTHYROIDISM   . Hyperthyroidism    s/p I-131 ablation 03/2011 of multinod goiter  . INSOMNIA   . MIGRAINE HEADACHE   . OBESITY   . Posttraumatic stress disorder   . Pulmonary embolism ([place]) 03/2014   with DVT  . Stroke [facility]) 03/2014   dysarthria    Past Surgical History:  Procedure Laterality Date  . ABDOMINAL HYSTERECTOMY  1976  . BREAST SURGERY     biopsy  . RADIOLOGY WITH ANESTHESIA Left 03/08/2014   Procedure: RADIOLOGY WITH ANESTHESIA;  Surgeon: Rob Hickman, MD;  Location: [facility];  Service: Radiology;  Laterality: Left;  . TONSILLECTOMY    . TOTAL HIP ARTHROPLASTY  1998    right    There were no vitals filed for this visit.      Subjective Assessment - 10/01/16 1446    Subjective The swelling is about the same. The leg is smaller in the morning and gets larger as she sits up.    Patient is accompained by: Family member   Pertinent History Has an appointment with the vein and vascular center next month about leg swelling. Had Doppler last week but haven't had the explained by a doctor yet. Leg swelling started April or May this year. Stroke was in March 2016. Right breast swelling started a couple months after that. No  cancer history.  Has some swelling in right arm that is variable; breast swelling varies a bit too, but got better with prednisone. Has arthritis.   Patient Stated Goals get some help with breast and/or leg swelling   Currently in Pain? Yes   Pain Score --  not rated   Pain Location Breast   Pain Orientation Right   Pain Descriptors / Indicators Heaviness   Pain Type Chronic pain                         [facility] Adult PT Treatment/Exercise - 10/01/16 0001      Manual Therapy   Manual Therapy Edema management;Manual Lymphatic Drainage (MLD)   Edema Management Educated pt's daughter about purchasing a sports bra with front closure for support for R breast to help manage edema. Also educated daughter that one with an x back would be better to help avoid direct pressure on subluxed shoulder. Educated daughter about elevating R breast with towel or pillow to help assist with drainage   Manual Lymphatic Drainage (MLD) Issued handout to pt's daughter and educated her on the following technique: superficial abdominals (deep skipped due to pt's inability to follow directions for deep breathing due to stroke), short neck, left axillary nodes and establishment of interaxillary pathway,  right inguinal nodes and establishment of axillo inguinal pathway (difficult due to pt in w/c), R breast (supported on towel) moving fluid towards pathways                        Long Term Clinic Goals - 09/29/16 1740      CC Long Term Goal  #1   Title Patient's daughter will be knowledgeable about manual lymph drainage for right breast swelling and possibly left leg swelling.   Time 4   Period Weeks   Status New     CC Long Term Goal  #2   Title Patient's daughter will be knowledgeable about appropriate compression bras.   Time 3   Period Weeks   Status New     CC Long Term Goal  #3   Title Patient's daughter will be knowledgeable about bandage-alternative garments for left leg  swelling.   Time 4   Period Weeks   Status New     CC Long Term Goal  #4   Title Patient's daughter will be knowledgeable about availability of pump for edema management.   Time 4   Period Weeks   Status New            Plan - 10/01/16 1545    Clinical Impression Statement Discussed with pt's daughters the option of receiving home health lymphedema services. They are interested if pt can receive exact same care that she is receiving at outpatient clinic. Issued R breast lymphedema handout on self drainage and educated pt's daughter in correct technique. Demonstrated proper technique to pt's daughter. Pt has increased fibrosis around nipple which softened with massage today. Educated pt's daughter on importance of compression in conjunction with massage technique.    Rehab Potential Good   Clinical Impairments Affecting Rehab Potential aphasia, right hemiplegia, significantly decreased mobility and transfers with a Hoyer lift   PT Frequency 2x / week   PT Duration 2 weeks  to 4 weeks as needed   PT Treatment/Interventions ADLs/Self Care Home Management;Manual lymph drainage;Compression bandaging;Manual techniques;Patient/family education;DME Instruction;Orthotic Fit/Training   PT Next Visit Plan instruct pt's daughter in LE self drainage technique. Later, work with daughter to obtain velcro compression garment for left leg (unless something else is suggested by visit to vascular MD) and in manual lymph drainage for same.  Also discuss possiblity of acquiring a pump for both areas.   Consulted and Agree with Plan of Care Patient;Family member/caregiver      Patient will benefit from skilled therapeutic intervention in order to improve the following deficits and impairments:  Increased edema, Decreased knowledge of precautions, Decreased knowledge of use of DME  Visit Diagnosis: Lymphedema, not elsewhere classified     Problem List Patient Active Problem List   Diagnosis Date  Noted  . Acquired lymphedema 09/11/2016  . Long term (current) use of anticoagulants [Z79.01] 09/10/2016  . Cough 08/23/2016  . Left hip pain 08/09/2016  . Yeast infection of the skin 07/01/2016  . Hematoma of abdominal wall, sequela 06/06/2016  . Abnormal stool color 05/18/2016  . Left leg swelling 05/18/2016  . Heat rash 05/04/2016  . Encounter for hospice care discussion   . Pressure injury of skin 04/27/2016  . Aspiration pneumonia ([place]) 04/20/2016  . Hemidiaphragm paralysis   . Wheelchair bound 02/03/2016  . Conjunctival hemorrhage of right eye 01/28/2016  . Ecchymosis 01/28/2016  . Vaginal bleeding 01/28/2016  . Gross hematuria 01/28/2016  . Discoloration of skin 09/26/2015  .  Dermatitis 09/13/2015  . Essential thrombocytosis ([place]) 07/31/2015  . SVC (superior vena cava obstruction), chronic 07/20/2015  . Dysphagia 07/20/2015  . Elevated liver enzymes 07/20/2015  . Constipation 07/20/2015  . Thrombophlebitis of breast, right 06/18/2015  . Tracheal deviation 06/06/2015  . Sensorineural hearing loss, bilateral, moderate-moderately severe 04/30/2015  . Abdominal wall lump 03/09/2015  . Frequent UTI 02/06/2015  . Primary osteoarthritis involving multiple joints 07/26/2014  . Pernicious anemia 07/26/2014  . Hearing loss 07/26/2014  . Encounter for therapeutic drug monitoring 07/17/2014  . Spastic hemiparesis affecting dominant side ([place]), right 05/31/2014  . DVT of lower extremity, bilateral ([place]) 03/14/2014  . Global aphasia 03/14/2014  . 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 Aphasia S/P CVA 03/13/2014  . Cerebral infarction due to embolism of left middle cerebral artery ([place])   . Stroke, embolic ([place]) 54/98/2641  . History of pulmonary embolism 03/08/2014  . Back pain 08/29/2013  . Primary localized osteoarthrosis, lower leg 03/06/2013  . IBS (irritable bowel syndrome)   . Multinodular goiter 01/13/2011  . Hyperthyroidism 11/11/2009  . Insomnia 07/03/2009  .  POSTTRAUMATIC STRESS DISORDER 08/22/2008  . OBESITY 04/21/2008  . Osteoarthritis 04/21/2008  . MIGRAINE HEADACHE 04/20/2008  . Essential hypertension 04/20/2008    Allyson Sabal [facility] 10/01/2016, 3:48 PM  [place], [place], [address] Phone: [phone]   Fax:  [phone]  Name: Leisl Spurrier MRN: [id number] Date of Birth: 1932/10/30  Allyson Sabal [place], PT 10/01/16 3:48 PM

## 2016-10-01 NOTE — Patient Instructions (Signed)
Self manual lymph drainage: Perform this sequence once a day.  Only give enough pressure no your skin to make the skin move.  Diaphragmatic - Supine   Inhale through nose making navel move out toward hands. Exhale through puckered lips, hands follow navel in. Repeat _5__ times. Rest _10__ seconds between repeats.   Copyright  VHI. All rights reserved.  Hug yourself.  Do circles at your neck just above your collarbones.  Repeat this 10 times.  Axilla - One at a Time   Using full weight of flat hand and fingers at center of uninvolved armpit, make _10__ in-place circles.   Copyright  VHI. All rights reserved.  LEG: Inguinal Nodes Stimulation   With small finger side of hand against hip crease on involved side, gently perform circles at the crease. Repeat __10_ times.   Copyright  VHI. All rights reserved.  1) Axilla to Inguinal Nodes - Sweep   On involved side, sweep _4__ times from armpit along side of trunk to hip crease.  Now gently stretch skin from the involved side to the uninvolved side across the chest at the shoulder line.  Repeat that 4 times.  Draw an imaginary diagonal line from upper outer breast through the nipple area toward lower inner breast.  Direct fluid upward and inward from this line toward the pathway across your upper chest .  Do this in three rows to treat all of the upper inner breast tissue, and do each row 3-4x.      Direct fluid to treat all of lower outer breast tissue downward and outward toward      pathway that is aimed at the left groin.  Finish by doing the pathways as described above going from your involved armpit to the same side groin and going across your upper chest from the involved shoulder to the uninvolved shoulder.  Repeat the steps above where you do circles in your right groin and left armpit. Copyright  VHI. All rights reserved.  

## 2016-10-02 ENCOUNTER — Ambulatory Visit: Payer: Medicare Other | Admitting: Endocrinology

## 2016-10-02 ENCOUNTER — Ambulatory Visit (HOSPITAL_BASED_OUTPATIENT_CLINIC_OR_DEPARTMENT_OTHER): Payer: Medicare Other | Admitting: Physical Medicine & Rehabilitation

## 2016-10-02 ENCOUNTER — Encounter: Payer: Self-pay | Admitting: Physical Medicine & Rehabilitation

## 2016-10-02 VITALS — BP 101/71 | HR 80 | Resp 14

## 2016-10-02 DIAGNOSIS — G8110 Spastic hemiplegia affecting unspecified side: Secondary | ICD-10-CM

## 2016-10-02 DIAGNOSIS — G8111 Spastic hemiplegia affecting right dominant side: Secondary | ICD-10-CM | POA: Diagnosis not present

## 2016-10-02 NOTE — Progress Notes (Signed)
Dysport Injection for spasticity using needle EMG guidance  Dilution: 200 Units/ml Indication: Severe spasticity which interferes with ADL,mobility and/or  hygiene and is unresponsive to medication management and other conservative care Informed consent was obtained after describing risks and benefits of the procedure with the patient. This includes bleeding, bruising, infection, excessive weakness, or medication side effects. A REMS form is on file and signed. Needle:  needle electrode Number of units per muscle Pronator teres 100 Pronator quadratus 100 FDP 100 FDS 838 Opponents pollicis 184 All injections were done after obtaining appropriate EMG activity and after negative drawback for blood. The patient tolerated the procedure well. Post procedure instructions were given. A followup appointment was made.   Plan for next injection FDP 100 Pronator quadratus 037 Opponens pollicis 543

## 2016-10-04 ENCOUNTER — Other Ambulatory Visit: Payer: Self-pay | Admitting: Endocrinology

## 2016-10-04 NOTE — Telephone Encounter (Signed)
Please refill x 1 Ov is due  

## 2016-10-05 ENCOUNTER — Other Ambulatory Visit: Payer: Self-pay

## 2016-10-05 ENCOUNTER — Encounter: Payer: Self-pay | Admitting: Podiatry

## 2016-10-05 ENCOUNTER — Ambulatory Visit (INDEPENDENT_AMBULATORY_CARE_PROVIDER_SITE_OTHER): Payer: Medicare Other | Admitting: Podiatry

## 2016-10-05 DIAGNOSIS — M79674 Pain in right toe(s): Secondary | ICD-10-CM | POA: Diagnosis not present

## 2016-10-05 DIAGNOSIS — B351 Tinea unguium: Secondary | ICD-10-CM

## 2016-10-05 DIAGNOSIS — M79675 Pain in left toe(s): Secondary | ICD-10-CM | POA: Diagnosis not present

## 2016-10-05 MED ORDER — METHIMAZOLE 5 MG PO TABS: ORAL_TABLET | ORAL | 1 refills | Status: DC

## 2016-10-05 NOTE — Progress Notes (Signed)
Patient ID: Sandy Barrett, female   DOB: 1932/05/01, 83 y.o.   MRN: [id number]    Subjective: This patient presents today with daughter present to treatment room complaining of thickened and uncomfortable hallux toenails left greater than right over an undetermined amount of time. The hallux nails primary left to become more uncomfortable and thickened when patient has direct shoe pressure over the toes Patient has history of stroke hemiparesis right side and is in a seated in a wheelchair and unable to transfer to treatment chair   Objective: Pleasant orientated 3 patient  Vascular: DP pulses 2/4 bilaterally PT pulses 1/4 bilaterally Capillary reflex immediate bilaterally  Dermatological: The hallux nails left greater right are deformed, discolored and hypertrophic The remaining toenails have texture and color changes with minimal deformity and palpable tenderness  Neurological: Ankle reflexes nonreactive right weekly reactive left Sensation to 10 g monofilament wire 0/5 right and 4/5 left  Musculoskeletal: Dorsi flexion and plantar flexion right 0/5 Dorsi flexion and plantar flexion left 5/5  Assessment: Stroke related right lower extremity weakness Symptomatic onychomycoses 6-10 with maximum symptoms and deformity in the hallux toenails   Plan:  Debridement of toenails 6-10 mechanically and electrically with slight bleeding distal right hallux to with topical antibiotic ointment and Band-Aid. Patient instructed remove the Band-Aid and 1-3 days and apply topical antibiotic ointment and a Band-Aid daily until a scab forms  Reappoint 3 months

## 2016-10-05 NOTE — Patient Instructions (Signed)
Removed Band-Aid on the right great toe 1-3 days apply topical antibiotic ointment and Band-Aid daily until a scab forms

## 2016-10-06 ENCOUNTER — Other Ambulatory Visit (HOSPITAL_BASED_OUTPATIENT_CLINIC_OR_DEPARTMENT_OTHER): Payer: Medicare Other

## 2016-10-06 ENCOUNTER — Ambulatory Visit (HOSPITAL_BASED_OUTPATIENT_CLINIC_OR_DEPARTMENT_OTHER): Payer: Medicare Other | Admitting: Hematology and Oncology

## 2016-10-06 ENCOUNTER — Telehealth: Payer: Self-pay | Admitting: Hematology and Oncology

## 2016-10-06 ENCOUNTER — Ambulatory Visit (HOSPITAL_BASED_OUTPATIENT_CLINIC_OR_DEPARTMENT_OTHER): Payer: Medicare Other

## 2016-10-06 VITALS — BP 108/69 | HR 81 | Temp 97.9°F | Resp 20 | Ht 60.0 in | Wt 176.0 lb

## 2016-10-06 DIAGNOSIS — Z86711 Personal history of pulmonary embolism: Secondary | ICD-10-CM

## 2016-10-06 DIAGNOSIS — I878 Other specified disorders of veins: Secondary | ICD-10-CM

## 2016-10-06 DIAGNOSIS — D473 Essential (hemorrhagic) thrombocythemia: Secondary | ICD-10-CM

## 2016-10-06 DIAGNOSIS — Z7901 Long term (current) use of anticoagulants: Secondary | ICD-10-CM | POA: Diagnosis not present

## 2016-10-06 LAB — CBC WITH DIFFERENTIAL/PLATELET
BASO%: 1.1 % (ref 0.0–2.0)
Basophils Absolute: 0.1 10*3/uL (ref 0.0–0.1)
EOS%: 1.7 % (ref 0.0–7.0)
Eosinophils Absolute: 0.1 10*3/uL (ref 0.0–0.5)
HCT: 43.5 % (ref 34.8–46.6)
HGB: 14.1 g/dL (ref 11.6–15.9)
LYMPH%: 18.7 % (ref 14.0–49.7)
MCH: 37 pg — ABNORMAL HIGH (ref 25.1–34.0)
MCHC: 32.4 g/dL (ref 31.5–36.0)
MCV: 114.2 fL — ABNORMAL HIGH (ref 79.5–101.0)
MONO#: 0.5 10*3/uL (ref 0.1–0.9)
MONO%: 8.5 % (ref 0.0–14.0)
NEUT#: 3.8 10*3/uL (ref 1.5–6.5)
NEUT%: 70 % (ref 38.4–76.8)
Platelets: 259 10*3/uL (ref 145–400)
RBC: 3.81 10*6/uL (ref 3.70–5.45)
RDW: 16.2 % — ABNORMAL HIGH (ref 11.2–14.5)
WBC: 5.4 10*3/uL (ref 3.9–10.3)
lymph#: 1 10*3/uL (ref 0.9–3.3)
nRBC: 0 % (ref 0–0)

## 2016-10-06 LAB — BASIC METABOLIC PANEL
Anion Gap: 8 mEq/L (ref 3–11)
BUN: 12.9 mg/dL (ref 7.0–26.0)
CO2: 27 mEq/L (ref 22–29)
Calcium: 9.4 mg/dL (ref 8.4–10.4)
Chloride: 107 mEq/L (ref 98–109)
Creatinine: 0.6 mg/dL (ref 0.6–1.1)
EGFR: >90 mL/min/{1.73_m2} (ref >90)
Glucose: 90 mg/dl (ref 70–140)
Potassium: 4.1 mEq/L (ref 3.5–5.1)
Sodium: 142 mEq/L (ref 136–145)

## 2016-10-06 MED ORDER — HYDROXYUREA 500 MG PO CAPS: ORAL_CAPSULE | ORAL | 3 refills | Status: DC

## 2016-10-06 NOTE — Telephone Encounter (Signed)
Gave patient AVS and calendar of upcoming January appointments °

## 2016-10-07 ENCOUNTER — Ambulatory Visit: Payer: Medicare Other | Attending: Hematology and Oncology

## 2016-10-07 DIAGNOSIS — I890 Lymphedema, not elsewhere classified: Secondary | ICD-10-CM | POA: Diagnosis not present

## 2016-10-07 DIAGNOSIS — R600 Localized edema: Secondary | ICD-10-CM | POA: Insufficient documentation

## 2016-10-07 NOTE — Therapy (Signed)
[place], [place], [address] Phone: [phone]   Fax:  [phone]  Physical Therapy Treatment  Patient Details  Name: Sandy Barrett MRN: [id number] Date of Birth: 1932/11/22 Referring Provider: Dr. Heath Lark  Encounter Date: 10/07/2016      PT End of Session - 10/07/16 1602    Visit Number 3   Number of Visits 9   Date for PT Re-Evaluation 11/13/16   PT Start Time 2536   PT Stop Time 1600   PT Time Calculation (min) 83 min   Activity Tolerance Patient tolerated treatment well   Behavior During Therapy [facility] for tasks assessed/performed      Past Medical History:  Diagnosis Date  . ARTHRITIS   . Arthritis   . Diverticulosis   . HYPERTENSION   . HYPERTHYROIDISM   . Hyperthyroidism    s/p I-131 ablation 03/2011 of multinod goiter  . INSOMNIA   . MIGRAINE HEADACHE   . OBESITY   . Posttraumatic stress disorder   . Pulmonary embolism ([place]) 03/2014   with DVT  . Stroke [facility]) 03/2014   dysarthria    Past Surgical History:  Procedure Laterality Date  . ABDOMINAL HYSTERECTOMY  1976  . BREAST SURGERY     biopsy  . RADIOLOGY WITH ANESTHESIA Left 03/08/2014   Procedure: RADIOLOGY WITH ANESTHESIA;  Surgeon: Rob Hickman, MD;  Location: [facility];  Service: Radiology;  Laterality: Left;  . TONSILLECTOMY    . TOTAL HIP ARTHROPLASTY  1998    right    There were no vitals filed for this visit.                       [place] Adult PT Treatment/Exercise - 10/07/16 0001      Manual Therapy   Manual therapy comments Issued chip pack and placed in inferior aspect of bra, also issued 1/4" gray foam for pt to try at home   Edema Management Educated pt's daughter about purchasing a sports bra with front closure for support for Rt breast to help manage edema. Also educated daughter that one with an x back would be better to help avoid direct pressure on subluxed shoulder. Educated  daughter about elevating R breast with towel or pillow to help assist with drainage   Manual Lymphatic Drainage (MLD) Educated daughter on the following technique: tried 5 deep dreaths, pt did just ok with directions of this (deep skipped due to pt's inability to follow directions for deep breathing due to stroke), short neck, left axillary nodes and establishment of interaxillary pathway, right inguinal nodes and establishment of axillo inguinal pathway (difficult due to pt in w/c), R breast (supported on towel) moving fluid towards pathways                PT Education - 10/07/16 1605    Education provided Yes   Education Details Info for [facility] to [employer] and [employer] and to call and make appts to be fitted; spent time answering pts daughters questions about all this and best type of bra (like sports bra) for pt to sleep in   Person(s) Educated Patient;Child(ren)   Methods Explanation;Handout   Comprehension Verbalized understanding                Long Term Clinic Goals - 09/29/16 1740      CC Long Term Goal  #1   Title Patient's daughter will be knowledgeable about manual lymph drainage  for right breast swelling and possibly left leg swelling.   Time 4   Period Weeks   Status New     CC Long Term Goal  #2   Title Patient's daughter will be knowledgeable about appropriate compression bras.   Time 3   Period Weeks   Status New     CC Long Term Goal  #3   Title Patient's daughter will be knowledgeable about bandage-alternative garments for left leg swelling.   Time 4   Period Weeks   Status New     CC Long Term Goal  #4   Title Patient's daughter will be knowledgeable about availability of pump for edema management.   Time 4   Period Weeks   Status New            Plan - 10/07/16 1602    Clinical Impression Statement Reviewed information from last session with pts daughter (this is daugther that was at eval, pts other daughter who was visiting from  out of town came last time) and instructed her in manual lymph drainage. Further discussed home health answering pts questions about this. They would like to come for a few more sessions here for consistency with learning manual lymph draiange but overall feel pt would benefit from home health as therapist will be able to instruct and treat pt in supine or sidelying which we can not do here due to pts history of stroke and she would require a hoyer lift.    Rehab Potential Good   Clinical Impairments Affecting Rehab Potential aphasia, right hemiplegia, significantly decreased mobility and transfers with a Hoyer lift   PT Frequency 2x / week   PT Duration 2 weeks  to 4 weeks as needed   PT Treatment/Interventions ADLs/Self Care Home Management;Manual lymph drainage;Compression bandaging;Manual techniques;Patient/family education;DME Instruction;Orthotic Fit/Training   PT Next Visit Plan Cont toinstruct/review with pt's daughter in breast drainage technique, and then show LE drainage. Later, work with daughter to obtain velcro compression garment for left leg (unless something else is suggested by visit to vascular MD) and in manual lymph drainage for same.  Also discuss possiblity of acquiring a pump for both areas.   Consulted and Agree with Plan of Care Patient      Patient will benefit from skilled therapeutic intervention in order to improve the following deficits and impairments:  Increased edema, Decreased knowledge of precautions, Decreased knowledge of use of DME  Visit Diagnosis: Lymphedema, not elsewhere classified  Localized edema     Problem List Patient Active Problem List   Diagnosis Date Noted  . Acquired lymphedema 09/11/2016  . Long term (current) use of anticoagulants [Z79.01] 09/10/2016  . Cough 08/23/2016  . Left hip pain 08/09/2016  . Yeast infection of the skin 07/01/2016  . Hematoma of abdominal wall, sequela 06/06/2016  . Abnormal stool color 05/18/2016  . Left  leg swelling 05/18/2016  . Heat rash 05/04/2016  . Encounter for hospice care discussion   . Pressure injury of skin 04/27/2016  . Aspiration pneumonia ([place]) 04/20/2016  . Hemidiaphragm paralysis   . Wheelchair bound 02/03/2016  . Conjunctival hemorrhage of right eye 01/28/2016  . Ecchymosis 01/28/2016  . Vaginal bleeding 01/28/2016  . Gross hematuria 01/28/2016  . Discoloration of skin 09/26/2015  . Dermatitis 09/13/2015  . Essential thrombocytosis ([place]) 07/31/2015  . SVC (superior vena cava obstruction), chronic 07/20/2015  . Dysphagia 07/20/2015  . Elevated liver enzymes 07/20/2015  . Constipation 07/20/2015  . Thrombophlebitis of breast, right  06/18/2015  . Tracheal deviation 06/06/2015  . Sensorineural hearing loss, bilateral, moderate-moderately severe 04/30/2015  . Abdominal wall lump 03/09/2015  . Frequent UTI 02/06/2015  . Primary osteoarthritis involving multiple joints 07/26/2014  . Pernicious anemia 07/26/2014  . Hearing loss 07/26/2014  . Encounter for therapeutic drug monitoring 07/17/2014  . Spastic hemiparesis affecting dominant side ([place]), right 05/31/2014  . DVT of lower extremity, bilateral ([place]) 03/14/2014  . Global aphasia 03/14/2014  . 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 Aphasia S/P CVA 03/13/2014  . Cerebral infarction due to embolism of left middle cerebral artery ([place])   . Stroke, embolic ([place]) 90/93/1121  . History of pulmonary embolism 03/08/2014  . Back pain 08/29/2013  . Primary localized osteoarthrosis, lower leg 03/06/2013  . IBS (irritable bowel syndrome)   . Multinodular goiter 01/13/2011  . Hyperthyroidism 11/11/2009  . Insomnia 07/03/2009  . POSTTRAUMATIC STRESS DISORDER 08/22/2008  . OBESITY 04/21/2008  . Osteoarthritis 04/21/2008  . MIGRAINE HEADACHE 04/20/2008  . Essential hypertension 04/20/2008    Otelia Limes, PTA 10/07/2016, 4:08 PM  [place], [place], [address] Phone: [phone]   Fax:  [phone]  Name: Sandy Barrett MRN: [id number] Date of Birth: 1932/05/17

## 2016-10-07 NOTE — Patient Instructions (Addendum)
[facility] [employer]  [phone]: Consider "X" back for a compression bra to alleviate pull on shoulder.   [facility] [phone]: For leg garment thinking of Circaid reduction kit  Name of [place] agency is [facility] and Leta Speller is certified lymphedema OT.  -tell doctor outpatient isn't best option for patient as it is taxing to get out of the house; request home health OT eval/treat for Rt breast/upper and Lt lower extremity lymphedema.    [facility] [phone]

## 2016-10-08 ENCOUNTER — Encounter: Payer: Self-pay | Admitting: Hematology and Oncology

## 2016-10-08 DIAGNOSIS — I878 Other specified disorders of veins: Secondary | ICD-10-CM | POA: Insufficient documentation

## 2016-10-08 NOTE — Assessment & Plan Note (Signed)
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

## 2016-10-08 NOTE — Progress Notes (Signed)
[facility] OFFICE PROGRESS NOTE  Patient Care Team: [name], MD as PCP - General (Internal Medicine) Eunice Blase, MD (Inactive) as Consulting Physician (Family Medicine) Renato Shin, MD as Consulting Physician (Endocrinology) Netta Cedars, MD as Consulting Physician (Orthopedic Surgery) Lyndal Pulley, DO (Sports Medicine) Garvin Fila, MD (Neurology)  SUMMARY OF ONCOLOGIC HISTORY:   Essential thrombocytosis ([place])   08/07/2013 Miscellaneous    The patient is noted to have elevated platelet count      03/08/2014 Imaging    Positive for acute PE with CT evidence of right heart strain (RV/LV Ratio = 0.9) consistent with at least submassive (intermediate risk)PE.       03/09/2014 Imaging    US venous Doppler showed deep vein thrombosis noted in the right distal common femoral vein, femoral vein, and popliteal vein. DVT noted in the left femoral and popliteal veins      03/09/2014 Imaging    Patchy areas of acute left MCA territory infarction. 2. Several punctate foci of acute right MCA territory infarction. Possible trace subarachnoid hemorrhage in the high right frontal lobe. 3. Occluded left ICA and left MCA      03/20/2015 Imaging    Compared to MRI on 03/09/14, there has been expected evolutional change of left MCA infarction. In addition, there may be a few areas of acute-subacute infarcts in the left basal ganglia vs artifact.        07/31/2015 Pathology Results    Peripheral blood is positive for JAK2 mutation      07/31/2015 -  Chemotherapy    She is started on 500 mg daily Hydrea      08/15/2015 Miscellaneous    The dose of hydroxyurea is increased to 1000 mg daily      09/26/2015 Miscellaneous    Dose of Hydrea to 500 mg daily except on Saturdays and Sundays she takes 1000 mg.      10/25/2015 Adverse Reaction    Dose of Hydrea is reduced to 500 mg daily       10 /04/2016 Miscellaneous    From 2017 to 2018, the dose of Hydrea is further  adjusted.       INTERVAL HISTORY: Please see below for problem oriented charting. She returns with her daughter The patient has dysarthria secondary to prior stroke. She is wheelchair-bound. She is undergoing physical therapy due to right-sided weakness and chronic swelling Her daughter stated she have intermittent vaginal bleeding but not significant. There were no reported hematuria, epistaxis or hematochezia. There was one recent urinary tract infection, successfully treated. She is able to take hydroxyurea as directed.  REVIEW OF SYSTEMS:   Constitutional: Denies fevers, chills or abnormal weight loss Eyes: Denies blurriness of vision Ears, nose, mouth, throat, and face: Denies mucositis or sore throat Respiratory: Denies cough, dyspnea or wheezes Cardiovascular: Denies palpitation, chest discomfort  Gastrointestinal:  Denies nausea, heartburn or change in bowel habits Skin: Denies abnormal skin rashes Lymphatics: Denies new lymphadenopathy or easy bruising Neurological:Denies numbness, tingling or new weaknesses Behavioral/Psych: Mood is stable, no new changes  All other systems were reviewed with the patient and are negative.  I have reviewed the past medical history, past surgical history, social history and family history with the patient and they are unchanged from previous note.  ALLERGIES:  has No Known Allergies.  MEDICATIONS:  Current Outpatient Prescriptions  Medication Sig Dispense Refill  . acetaminophen (TYLENOL) 160 MG/5ML liquid Take 480 mg by mouth every 4 (four) hours as  needed for fever or pain.    [name] albuterol (PROVENTIL HFA) 108 (90 Base) MCG/ACT inhaler Inhale 2 puffs into the lungs every 6 (six) hours as needed for wheezing or shortness of breath. 1 Inhaler 1  . diclofenac sodium (VOLTAREN) 1 % GEL APPLY (2GMS) TOPICALLY THREE TIMES DAILY. 300 g 5  . gabapentin (NEURONTIN) 100 MG capsule Take 1 capsule (100 mg total) by mouth 2 (two) times daily. 60  capsule 2  . guaiFENesin (MUCINEX) 600 MG 12 hr tablet Take 1 tablet (600 mg total) by mouth 2 (two) times daily. (Patient taking differently: Take 600 mg by mouth 2 (two) times daily as needed for cough or to loosen phlegm. ) 10 tablet 0  . hydroxyurea (HYDREA) 500 MG capsule Take 1 tablet (500 mg) Monday through Friday, take 2 tablets ( 1000 mg ) Saturday and Sunday 90 capsule 3  . ipratropium-albuterol (DUONEB) 0.5-2.5 (3) MG/3ML SOLN Take 3 mLs by nebulization every 6 (six) hours as needed. (Patient taking differently: Take 3 mLs by nebulization every 6 (six) hours as needed (shortness of breath). ) 360 mL 0  . methimazole (TAPAZOLE) 5 MG tablet TAKE 1 TABLET BY MOUTH TWICE WEEKLY 12 tablet 1  . nystatin (MYCOSTATIN/NYSTOP) powder Apply topically 3 (three) times daily. 30 g 0  . polyethylene glycol (MIRALAX / GLYCOLAX) packet TAKE 17G BY MOUTH TWICE A DAY (Patient taking differently: TAKE 17G BY MOUTH TWICE A DAY AS NEED FOR CONSTIPATION) 14 packet 1  . Probiotic Product (ALIGN) 4 MG CAPS Take 1 capsule by mouth daily.     . propranolol (INDERAL) 10 MG tablet Take 0.5 tablets (5 mg total) by mouth 2 (two) times daily. 60 tablet 5  . PROTONIX 40 MG PACK TAKE 20 MLS (40 MG TOTAL) BY MOUTH DAILY. 30 each 5  . terconazole (TERAZOL 7) 0.4 % vaginal cream Place 1 applicator vaginally at bedtime. (Patient taking differently: Place 1 applicator vaginally at bedtime as needed (irritation). ) 90 g 3  . traMADol (ULTRAM) 50 MG tablet TAKE 2 TABLETS 3 TIMES A DAY AS NEEDED 180 tablet 1  . traZODone (DESYREL) 50 MG tablet TAKE 1 TABLET (50 MG TOTAL) BY MOUTH AT BEDTIME AS NEEDED. FOR SLEEP 30 tablet 5  . triamcinolone ointment (KENALOG) 0.5 % Apply 1 application topically 2 (two) times daily. 30 g 0  . warfarin (COUMADIN) 2 MG tablet Take 2 tablets (4 mg total) by mouth one time only at 6 PM. 60 tablet 1   No current facility-administered medications for this visit.     PHYSICAL EXAMINATION: ECOG  PERFORMANCE STATUS: 3 - Symptomatic, >50% confined to bed  Vitals:   10/06/16 1413  BP: 108/69  Pulse: 81  Resp: 20  Temp: 97.9 F (36.6 C)  SpO2: 96%   Filed Weights   10/06/16 1413  Weight: 176 lb (79.8 kg)    GENERAL:alert, no distress and comfortable SKIN: Noted chronic venous congestion and tissue swelling on her chest wall, right sided body and bilateral lower extremity EYES: normal, Conjunctiva are pink and non-injected, sclera clear OROPHARYNX:no exudate, no erythema and lips, buccal mucosa, and tongue normal  NECK: supple, thyroid normal size, non-tender, without nodularity LYMPH:  no palpable lymphadenopathy in the cervical, axillary or inguinal LUNGS: clear to auscultation and percussion with normal breathing effort HEART: regular rate & rhythm and no murmurs with moderate bilateral lower extremity edema ABDOMEN:abdomen soft, non-tender and normal bowel sounds Musculoskeletal:no cyanosis of digits and no clubbing  NEURO: alert &  oriented x 3 with dysarthria, right deficit  LABORATORY DATA:  I have reviewed the data as listed    Component Value Date/Time   NA 142 10/06/2016 1352   K 4.1 10/06/2016 1352   CL 108 05/22/2016 0409   CO2 27 10/06/2016 1352   GLUCOSE 90 10/06/2016 1352   BUN 12.9 10/06/2016 1352   CREATININE 0.6 10/06/2016 1352   CALCIUM 9.4 10/06/2016 1352   PROT 6.6 01/26/2016 2115   ALBUMIN 3.4 (L) 01/26/2016 2115   AST 16 01/26/2016 2115   ALT 13 (L) 01/26/2016 2115   ALKPHOS 61 01/26/2016 2115   BILITOT 0.5 01/26/2016 2115   GFRNONAA >60 05/22/2016 0409   GFRAA >60 05/22/2016 0409    No results found for: SPEP, UPEP  Lab Results  Component Value Date   WBC 5.4 10/06/2016   NEUTROABS 3.8 10/06/2016   HGB 14.1 10/06/2016   HCT 43.5 10/06/2016   MCV 114.2 (H) 10/06/2016   PLT 259 Clumped Platelets--Appears Adequate 10/06/2016      Chemistry      Component Value Date/Time   NA 142 10/06/2016 1352   K 4.1 10/06/2016 1352   CL 108  05/22/2016 0409   CO2 27 10/06/2016 1352   BUN 12.9 10/06/2016 1352   CREATININE 0.6 10/06/2016 1352      Component Value Date/Time   CALCIUM 9.4 10/06/2016 1352   ALKPHOS 61 01/26/2016 2115   AST 16 01/26/2016 2115   ALT 13 (L) 01/26/2016 2115   BILITOT 0.5 01/26/2016 2115      ASSESSMENT & PLAN:  Essential thrombocytosis ([place]) She tolerated hydroxyurea well. Recently, I adjusted the dose of hydroxyurea.  She will take 500 mg daily from Monday to Fridays and 1000 mg on Saturdays and Sundays Her blood counts are stable.  I recommend we continue the same.  History of pulmonary embolism The goal is strictly for secondary prevention She tolerated anticoagulation therapy well except for intermittent vaginal spotting She will continue INR monitoring through her primary care doctor.  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 CBC with Differential/Platelet    Standing Status:   Standing    Number of Occurrences:   22    Standing Expiration Date:   10/06/2017   All questions were answered. The patient knows to call the clinic with any problems, questions or concerns. No barriers to learning was detected. I spent 15 minutes counseling the patient face to face. The total time spent in the appointment was 20 minutes and more than 50% was on counseling and review of test results     Heath Lark, MD 10/08/2016 1:46 PM

## 2016-10-08 NOTE — Assessment & Plan Note (Signed)
The goal is strictly for secondary prevention She tolerated anticoagulation therapy well except for intermittent vaginal spotting She will continue INR monitoring through her primary care doctor.

## 2016-10-08 NOTE — Assessment & Plan Note (Signed)
She tolerated hydroxyurea well. Recently, I adjusted the dose of hydroxyurea.  She will take 500 mg daily from Monday to Fridays and 1000 mg on Saturdays and Sundays Her blood counts are stable.  I recommend we continue the same.

## 2016-10-13 ENCOUNTER — Ambulatory Visit: Payer: Self-pay | Admitting: General Practice

## 2016-10-13 DIAGNOSIS — I69351 Hemiplegia and hemiparesis following cerebral infarction affecting right dominant side: Secondary | ICD-10-CM | POA: Diagnosis not present

## 2016-10-13 DIAGNOSIS — I10 Essential (primary) hypertension: Secondary | ICD-10-CM | POA: Diagnosis not present

## 2016-10-13 DIAGNOSIS — R1310 Dysphagia, unspecified: Secondary | ICD-10-CM | POA: Diagnosis not present

## 2016-10-13 DIAGNOSIS — I69391 Dysphagia following cerebral infarction: Secondary | ICD-10-CM | POA: Diagnosis not present

## 2016-10-13 DIAGNOSIS — L89322 Pressure ulcer of left buttock, stage 2: Secondary | ICD-10-CM | POA: Diagnosis not present

## 2016-10-13 DIAGNOSIS — Z7901 Long term (current) use of anticoagulants: Secondary | ICD-10-CM

## 2016-10-13 DIAGNOSIS — I69320 Aphasia following cerebral infarction: Secondary | ICD-10-CM | POA: Diagnosis not present

## 2016-10-13 LAB — POCT INR: INR: 1.9

## 2016-10-14 ENCOUNTER — Ambulatory Visit: Payer: Medicare Other

## 2016-10-14 DIAGNOSIS — R600 Localized edema: Secondary | ICD-10-CM | POA: Diagnosis not present

## 2016-10-14 DIAGNOSIS — I890 Lymphedema, not elsewhere classified: Secondary | ICD-10-CM | POA: Diagnosis not present

## 2016-10-14 NOTE — Therapy (Signed)
[place], [place], [address] Phone: [phone]   Fax:  [phone]  Physical Therapy Treatment  Patient Details  Name: Sandy Barrett MRN: [id number] Date of Birth: 1932/09/12 Referring Provider: Dr. Heath Lark  Encounter Date: 10/14/2016      PT End of Session - 10/14/16 1518    Visit Number 4   Number of Visits 9   Date for PT Re-Evaluation 11/13/16   PT Start Time 9381   PT Stop Time 1520   PT Time Calculation (min) 44 min   Activity Tolerance Patient tolerated treatment well   Behavior During Therapy [facility] for tasks assessed/performed      Past Medical History:  Diagnosis Date  . ARTHRITIS   . Arthritis   . Diverticulosis   . HYPERTENSION   . HYPERTHYROIDISM   . Hyperthyroidism    s/p I-131 ablation 03/2011 of multinod goiter  . INSOMNIA   . MIGRAINE HEADACHE   . OBESITY   . Posttraumatic stress disorder   . Pulmonary embolism ([place]) 03/2014   with DVT  . Stroke [facility]) 03/2014   dysarthria    Past Surgical History:  Procedure Laterality Date  . ABDOMINAL HYSTERECTOMY  1976  . BREAST SURGERY     biopsy  . RADIOLOGY WITH ANESTHESIA Left 03/08/2014   Procedure: RADIOLOGY WITH ANESTHESIA;  Surgeon: Rob Hickman, MD;  Location: [place];  Service: Radiology;  Laterality: Left;  . TONSILLECTOMY    . TOTAL HIP ARTHROPLASTY  1998    right    There were no vitals filed for this visit.      Subjective Assessment - 10/14/16 1442    Subjective Per daughter, her Rt breast was much smaller after last visit. Has tried wearing a sports bra but it rubbed a sore lateral to Rt breast. [facility] for a compression bra on 10/28/16 and [facility] Friday for bil LE garments.   Pertinent History Has an appointment with the vein and vascular center next month about leg swelling. Had Doppler last week but haven't had the explained by a doctor yet. Leg swelling started  April or May this year. Stroke was in March 2016. Right breast swelling started a couple months after that. No cancer history.  Has some swelling in right arm that is variable; breast swelling varies a bit too, but got better with prednisone. Has arthritis.   Patient Stated Goals get some help with breast and/or leg swelling   Currently in Pain? No/denies                         [facility] Adult PT Treatment/Exercise - 10/14/16 0001      Manual Therapy   Edema Management Issued more chip pieces and extra thick stockinette pieces   Manual Lymphatic Drainage (MLD) Reviewed with daughter the following technique and had her return demonstration: tried 5 deep dreaths, pt did just ok with directions of this (deep skipped due to pt's inability to follow directions for deep breathing due to stroke), short neck, left axillary nodes and establishment of interaxillary pathway, right inguinal nodes and establishment of axillo inguinal pathway (difficult due to pt in w/c), R breast (supported on towel) moving fluid towards pathways                        [facility] Goals - 09/29/16 1740  CC Long Term Goal  #1   Title Patient's daughter will be knowledgeable about manual lymph drainage for right breast swelling and possibly left leg swelling.   Time 4   Period Weeks   Status New     CC Long Term Goal  #2   Title Patient's daughter will be knowledgeable about appropriate compression bras.   Time 3   Period Weeks   Status New     CC Long Term Goal  #3   Title Patient's daughter will be knowledgeable about bandage-alternative garments for left leg swelling.   Time 4   Period Weeks   Status New     CC Long Term Goal  #4   Title Patient's daughter will be knowledgeable about availability of pump for edema management.   Time 4   Period Weeks   Status New            Plan - 10/14/16 1520    Clinical Impression Statement Reviewed and continued manual lymph  drainage with daugther. Rt breast much softer than last visit and daughter reports a few days after last visit breast was even more improved. Daughter has tried having pt sleep in sports bra but she ended up with a sore 2 different times in same spot so suggested she stop this for now and wait until she gets a better fitting compression bra in 2 weeks at [facility].    Rehab Potential Good   Clinical Impairments Affecting Rehab Potential aphasia, right hemiplegia, significantly decreased mobility and transfers with a Hoyer lift   PT Frequency 2x / week   PT Duration 2 weeks  to 4 weeks as needed   PT Treatment/Interventions ADLs/Self Care Home Management;Manual lymph drainage;Compression bandaging;Manual techniques;Patient/family education;DME Instruction;Orthotic Fit/Training   PT Next Visit Plan Cont toinstruct/review with pt's daughter in breast drainage technique, and then show LE drainage. Later, work with daughter to obtain velcro compression garment for left leg (unless something else is suggested by visit to vascular MD) and in manual lymph drainage for same.  Also discuss possiblity of acquiring a pump for both areas.   Consulted and Agree with Plan of Care Patient      Patient will benefit from skilled therapeutic intervention in order to improve the following deficits and impairments:  Increased edema, Decreased knowledge of precautions, Decreased knowledge of use of DME  Visit Diagnosis: Lymphedema, not elsewhere classified  Localized edema     Problem List Patient Active Problem List   Diagnosis Date Noted  . Venous congestion 10/08/2016  . Acquired lymphedema 09/11/2016  . Long term (current) use of anticoagulants [Z79.01] 09/10/2016  . Cough 08/23/2016  . Left hip pain 08/09/2016  . Yeast infection of the skin 07/01/2016  . Hematoma of abdominal wall, sequela 06/06/2016  . Abnormal stool color 05/18/2016  . Left leg swelling 05/18/2016  . Heat rash 05/04/2016   . Encounter for hospice care discussion   . Pressure injury of skin 04/27/2016  . Aspiration pneumonia ([place]) 04/20/2016  . Hemidiaphragm paralysis   . Wheelchair bound 02/03/2016  . Conjunctival hemorrhage of right eye 01/28/2016  . Ecchymosis 01/28/2016  . Vaginal bleeding 01/28/2016  . Gross hematuria 01/28/2016  . Discoloration of skin 09/26/2015  . Dermatitis 09/13/2015  . Essential thrombocytosis ([place]) 07/31/2015  . SVC (superior vena cava obstruction), chronic 07/20/2015  . Dysphagia 07/20/2015  . Elevated liver enzymes 07/20/2015  . Constipation 07/20/2015  . Thrombophlebitis of breast, right 06/18/2015  . Tracheal deviation 06/06/2015  .  Sensorineural hearing loss, bilateral, moderate-moderately severe 04/30/2015  . Abdominal wall lump 03/09/2015  . Frequent UTI 02/06/2015  . Primary osteoarthritis involving multiple joints 07/26/2014  . Pernicious anemia 07/26/2014  . Hearing loss 07/26/2014  . Encounter for therapeutic drug monitoring 07/17/2014  . Spastic hemiparesis affecting dominant side ([place]), right 05/31/2014  . DVT of lower extremity, bilateral ([place]) 03/14/2014  . Global aphasia 03/14/2014  . 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 Aphasia S/P CVA 03/13/2014  . Cerebral infarction due to embolism of left middle cerebral artery ([place])   . Stroke, embolic ([place]) 75/91/6384  . History of pulmonary embolism 03/08/2014  . Back pain 08/29/2013  . Primary localized osteoarthrosis, lower leg 03/06/2013  . IBS (irritable bowel syndrome)   . Multinodular goiter 01/13/2011  . Hyperthyroidism 11/11/2009  . Insomnia 07/03/2009  . POSTTRAUMATIC STRESS DISORDER 08/22/2008  . OBESITY 04/21/2008  . Osteoarthritis 04/21/2008  . MIGRAINE HEADACHE 04/20/2008  . Essential hypertension 04/20/2008    Otelia Limes, PTA 10/14/2016, 3:24 PM  [place], [place], [address] Phone: [phone]    Fax:  [phone]  Name: Ayleah Hofmeister MRN: [id number] Date of Birth: Apr 23, 1932

## 2016-10-15 ENCOUNTER — Ambulatory Visit: Payer: Medicare Other | Admitting: Physical Therapy

## 2016-10-16 ENCOUNTER — Encounter: Payer: Medicare Other | Admitting: Vascular Surgery

## 2016-10-19 ENCOUNTER — Telehealth: Payer: Self-pay | Admitting: Emergency Medicine

## 2016-10-19 DIAGNOSIS — G8110 Spastic hemiplegia affecting unspecified side: Secondary | ICD-10-CM

## 2016-10-19 NOTE — Telephone Encounter (Signed)
Received voicemail from pts daughter, they would like a referral for home health and OT with [facility]. Please advise.

## 2016-10-19 NOTE — Telephone Encounter (Signed)
Ok - do they need verbal or written orders

## 2016-10-20 ENCOUNTER — Ambulatory Visit: Payer: Medicare Other | Admitting: Physical Therapy

## 2016-10-20 ENCOUNTER — Encounter: Payer: Medicare Other | Admitting: Physical Therapy

## 2016-10-20 DIAGNOSIS — R600 Localized edema: Secondary | ICD-10-CM

## 2016-10-20 DIAGNOSIS — I890 Lymphedema, not elsewhere classified: Secondary | ICD-10-CM | POA: Diagnosis not present

## 2016-10-20 NOTE — Therapy (Addendum)
[place], [place], [address] Phone: [phone]   Fax:  [phone]  Physical Therapy Treatment  Patient Details  Name: Sandy Barrett MRN: [id number] Date of Birth: Jul 13, 1932 Referring Provider: Dr. Heath Lark  Encounter Date: 10/20/2016      PT End of Session - 10/20/16 1626    Visit Number 5   Number of Visits 9   Date for PT Re-Evaluation 11/13/16   PT Start Time 9458   PT Stop Time 1521   PT Time Calculation (min) 42 min   Activity Tolerance Patient tolerated treatment well   Behavior During Therapy [facility] for tasks assessed/performed      Past Medical History:  Diagnosis Date  . ARTHRITIS   . Arthritis   . Diverticulosis   . HYPERTENSION   . HYPERTHYROIDISM   . Hyperthyroidism    s/p I-131 ablation 03/2011 of multinod goiter  . INSOMNIA   . MIGRAINE HEADACHE   . OBESITY   . Posttraumatic stress disorder   . Pulmonary embolism ([place]) 03/2014   with DVT  . Stroke [facility]) 03/2014   dysarthria    Past Surgical History:  Procedure Laterality Date  . ABDOMINAL HYSTERECTOMY  1976  . BREAST SURGERY     biopsy  . RADIOLOGY WITH ANESTHESIA Left 03/08/2014   Procedure: RADIOLOGY WITH ANESTHESIA;  Surgeon: Rob Hickman, MD;  Location: [place];  Service: Radiology;  Laterality: Left;  . TONSILLECTOMY    . TOTAL HIP ARTHROPLASTY  1998    right    There were no vitals filed for this visit.      Subjective Assessment - 10/20/16 1441    Subjective Things are going fine, I can tell that it's working.  I think it will do even better once we get to [facility] to get the proper compression garments. Went to [facility] and picked out leg garments and is waiting until they talk to [facility] to ask them if they should go ahead with that. Worked with [employer]. Daughter says she is doing the manual llymph drainage and feels good about it.   Pertinent History Has an appointment  with the vein and vascular center next month about leg swelling. Had Doppler last week but haven't had the explained by a doctor yet. Leg swelling started April or May this year. Stroke was in March 2016. Right breast swelling started a couple months after that. No cancer history.  Has some swelling in right arm that is variable; breast swelling varies a bit too, but got better with prednisone. Has arthritis.   Currently in Pain? No/denies  but has been rubbing the left knee                         [facility] Adult PT Treatment/Exercise - 10/20/16 0001      Manual Therapy   Manual Lymphatic Drainage (MLD) Performed the following while instructing patient's daughter [name] in it;  short neck, left axilla and inguino-axillary anastomosis, right groin and anterior interinguinal anastomosis, and left LE from dorsal foot to lateral hip. After demonstration and instruction, had patient's daughter perform this (though with limited repetitions due to time constraints).                 PT Education - 10/20/16 1626    Education provided Yes   Education Details about how to do manual lymph drainage for left LE   Person(s)  Educated Patient;Child(ren)  daughter [name]   Methods Explanation;Demonstration;Verbal cues;Tactile cues;Handout   Comprehension Verbalized understanding;Returned demonstration;Need further instruction                [facility] Goals - 10/20/16 1630      CC Long Term Goal  #1   Title Patient's daughter will be knowledgeable about manual lymph drainage for right breast swelling and possibly left leg swelling.   Status Partially Met     CC Long Term Goal  #2   Title Patient's daughter will be knowledgeable about appropriate compression bras.   Status Partially Met     CC Long Term Goal  #3   Title Patient's daughter will be knowledgeable about bandage-alternative garments for left leg swelling.   Status Partially Met     CC Long Term Goal   #4   Title Patient's daughter will be knowledgeable about availability of pump for edema management.   Status On-going            Plan - 10/20/16 1627    Clinical Impression Statement Taught patient's daughter [name] how to do manual lymph drainage for left LE today.  She performed it but would benefit from review. Handout was given. She has chosen a leg velcro compression garment, but hasn't ordered it yet; she is waiting until after they are [facility].    Rehab Potential Good   Clinical Impairments Affecting Rehab Potential aphasia, right hemiplegia, significantly decreased mobility and transfers with a Hoyer lift   PT Frequency 2x / week   PT Duration 4 weeks   PT Treatment/Interventions ADLs/Self Care Home Management;Manual lymph drainage;Compression bandaging;Manual techniques;Patient/family education;DME Instruction;Orthotic Fit/Training   PT Next Visit Plan Cont to review with pt's daughter LE manual lymph drainage. Later, work with daughter to obtain velcro compression garment for left leg (unless something else is suggested by visit to vascular MD.  Also discuss possiblity of acquiring a pump for both areas.   PT Home Exercise Plan manual lymph drainage for breast and leg   Consulted and Agree with Plan of Care Patient;Family member/caregiver      Patient will benefit from skilled therapeutic intervention in order to improve the following deficits and impairments:  Increased edema, Decreased knowledge of precautions, Decreased knowledge of use of DME  Visit Diagnosis: Lymphedema, not elsewhere classified  Localized edema     Problem List Patient Active Problem List   Diagnosis Date Noted  . Venous congestion 10/08/2016  . Acquired lymphedema 09/11/2016  . Long term (current) use of anticoagulants [Z79.01] 09/10/2016  . Cough 08/23/2016  . Left hip pain 08/09/2016  . Yeast infection of the skin 07/01/2016  . Hematoma of abdominal wall,  sequela 06/06/2016  . Abnormal stool color 05/18/2016  . Left leg swelling 05/18/2016  . Heat rash 05/04/2016  . Encounter for hospice care discussion   . Pressure injury of skin 04/27/2016  . Aspiration pneumonia ([place]) 04/20/2016  . Hemidiaphragm paralysis   . Wheelchair bound 02/03/2016  . Conjunctival hemorrhage of right eye 01/28/2016  . Ecchymosis 01/28/2016  . Vaginal bleeding 01/28/2016  . Gross hematuria 01/28/2016  . Discoloration of skin 09/26/2015  . Dermatitis 09/13/2015  . Essential thrombocytosis ([place]) 07/31/2015  . SVC (superior vena cava obstruction), chronic 07/20/2015  . Dysphagia 07/20/2015  . Elevated liver enzymes 07/20/2015  . Constipation 07/20/2015  . Thrombophlebitis of breast, right 06/18/2015  . Tracheal deviation 06/06/2015  . Sensorineural hearing loss, bilateral, moderate-moderately severe  04/30/2015  . Abdominal wall lump 03/09/2015  . Frequent UTI 02/06/2015  . Primary osteoarthritis involving multiple joints 07/26/2014  . Pernicious anemia 07/26/2014  . Hearing loss 07/26/2014  . Encounter for therapeutic drug monitoring 07/17/2014  . Spastic hemiparesis affecting dominant side ([place]), right 05/31/2014  . DVT of lower extremity, bilateral ([place]) 03/14/2014  . Global aphasia 03/14/2014  . 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 Aphasia S/P CVA 03/13/2014  . Cerebral infarction due to embolism of left middle cerebral artery ([place])   . Stroke, embolic ([place]) 56/15/3794  . History of pulmonary embolism 03/08/2014  . Back pain 08/29/2013  . Primary localized osteoarthrosis, lower leg 03/06/2013  . IBS (irritable bowel syndrome)   . Multinodular goiter 01/13/2011  . Hyperthyroidism 11/11/2009  . Insomnia 07/03/2009  . POSTTRAUMATIC STRESS DISORDER 08/22/2008  . OBESITY 04/21/2008  . Osteoarthritis 04/21/2008  . MIGRAINE HEADACHE 04/20/2008  . Essential hypertension 04/20/2008    SALISBURY,DONNA 10/20/2016, 4:32 PM  [place], [place], [address] Phone: [phone]   Fax:  [phone]  Name: Verley Pariseau MRN: [id number] Date of Birth: 11/22/1932  Serafina Royals, PT 10/20/16 4:32 PM  PHYSICAL THERAPY DISCHARGE SUMMARY  Visits from Start of Care: 5  Current functional level related to goals / functional outcomes: Goals partially met as noted above.   Remaining deficits: Unknown.  Pt. was to return for further follow-up, but stopped coming to therapy.   Education / Equipment: Daughter learned manual lymph drainage. Plan: Patient agrees to discharge.  Patient goals were partially met. Patient is being discharged due to not returning since the last visit.  ?????   Serafina Royals, PT 04/15/17 3:50 PM

## 2016-10-20 NOTE — Telephone Encounter (Signed)
LVM asking daughter if this new referral or existing service.

## 2016-10-20 NOTE — Patient Instructions (Signed)
Deep Effective Breath   Standing, sitting, or laying down place both hands on the belly. Take a deep breath IN, expanding the belly; then breath OUT, contracting the belly. Repeat __5__ times. Do __2-3__ sessions per day and before each self massage.  http://gt2.exer.us/866   Copyright  VHI. All rights reserved.  Inguinal Nodes to Axilla - Clear   On involved side, at armpit, make _5__ in-place circles. Then from hip proceed in sections to armpit with stationary circles or pumps _5_ times, this is your pathway. Do _1__ time per day.  Copyright  VHI. All rights reserved.  LEG: Knee to Hip - Clear   Pump up outer thigh of involved leg from knee to outer hip. Then do stationary circles from inner to outer thigh, then do outer thigh again. Next, interlace fingers behind knee IF ABLE and make in-place circles. Do _5_ times of each sequence.  Do _1__ time per day.  Copyright  VHI. All rights reserved.  LEG: Ankle to Hip Sweep   Hands on sides of ankle of involved leg, pump _5__ times up both sides of lower leg, then retrace steps up outer thigh to hip as before and back to pathway. Do _2-3_ times. Do __1_ time per day.  Copyright  VHI. All rights reserved.  FOOT: Dorsum of Foot and Toes Massage   One hand on top of foot make _5_ stationary circles or pumps, then either on top of toes or each individual toe do _5_ pumps. Then retrace all steps pumping back up both sides of lower leg, outer thigh, and then pathway. Finish with what you started with, _5_ circles at involved side arm pit. All _2-3_ times at each sequence. Do _1__ time per day.  Copyright  VHI. All rights reserved.    

## 2016-10-21 DIAGNOSIS — I69351 Hemiplegia and hemiparesis following cerebral infarction affecting right dominant side: Secondary | ICD-10-CM | POA: Diagnosis not present

## 2016-10-21 DIAGNOSIS — Z7901 Long term (current) use of anticoagulants: Secondary | ICD-10-CM | POA: Diagnosis not present

## 2016-10-21 DIAGNOSIS — I69320 Aphasia following cerebral infarction: Secondary | ICD-10-CM | POA: Diagnosis not present

## 2016-10-21 DIAGNOSIS — H9190 Unspecified hearing loss, unspecified ear: Secondary | ICD-10-CM | POA: Diagnosis not present

## 2016-10-21 DIAGNOSIS — I69391 Dysphagia following cerebral infarction: Secondary | ICD-10-CM

## 2016-10-21 DIAGNOSIS — I82402 Acute embolism and thrombosis of unspecified deep veins of left lower extremity: Secondary | ICD-10-CM | POA: Diagnosis not present

## 2016-10-21 DIAGNOSIS — F4310 Post-traumatic stress disorder, unspecified: Secondary | ICD-10-CM | POA: Diagnosis not present

## 2016-10-21 DIAGNOSIS — L89322 Pressure ulcer of left buttock, stage 2: Secondary | ICD-10-CM | POA: Diagnosis not present

## 2016-10-21 DIAGNOSIS — D510 Vitamin B12 deficiency anemia due to intrinsic factor deficiency: Secondary | ICD-10-CM | POA: Diagnosis not present

## 2016-10-21 DIAGNOSIS — R1310 Dysphagia, unspecified: Secondary | ICD-10-CM | POA: Diagnosis not present

## 2016-10-21 DIAGNOSIS — I10 Essential (primary) hypertension: Secondary | ICD-10-CM | POA: Diagnosis not present

## 2016-10-21 DIAGNOSIS — Z8701 Personal history of pneumonia (recurrent): Secondary | ICD-10-CM

## 2016-10-21 DIAGNOSIS — Z5181 Encounter for therapeutic drug level monitoring: Secondary | ICD-10-CM

## 2016-10-22 ENCOUNTER — Ambulatory Visit: Payer: Medicare Other

## 2016-10-22 ENCOUNTER — Encounter: Payer: Medicare Other | Admitting: Physical Therapy

## 2016-10-22 ENCOUNTER — Ambulatory Visit (INDEPENDENT_AMBULATORY_CARE_PROVIDER_SITE_OTHER): Payer: Medicare Other | Admitting: General Practice

## 2016-10-22 DIAGNOSIS — I82403 Acute embolism and thrombosis of unspecified deep veins of lower extremity, bilateral: Secondary | ICD-10-CM | POA: Diagnosis not present

## 2016-10-22 DIAGNOSIS — Z7901 Long term (current) use of anticoagulants: Secondary | ICD-10-CM

## 2016-10-22 LAB — POCT INR: INR: 3

## 2016-10-22 NOTE — Progress Notes (Signed)
Agree with management.  Stacy J Burns, MD  

## 2016-10-22 NOTE — Telephone Encounter (Signed)
Pt came in for a coumadin visit today & her dtr followed up on [facility] referral.  Dtr wanted Dr. Quay Burow to know this is a new referral d/t the exercises pt needs to do which require her lying down & the facility cannot accommodate this for the pt.

## 2016-10-22 NOTE — Patient Instructions (Signed)
Pre visit review using our clinic review tool, if applicable. No additional management support is needed unless otherwise documented below in the visit note. 

## 2016-10-23 ENCOUNTER — Encounter: Payer: Medicare Other | Admitting: Vascular Surgery

## 2016-10-23 DIAGNOSIS — R54 Age-related physical debility: Secondary | ICD-10-CM | POA: Diagnosis not present

## 2016-10-23 DIAGNOSIS — Z7901 Long term (current) use of anticoagulants: Secondary | ICD-10-CM | POA: Diagnosis not present

## 2016-10-23 DIAGNOSIS — Z8673 Personal history of transient ischemic attack (TIA), and cerebral infarction without residual deficits: Secondary | ICD-10-CM | POA: Diagnosis not present

## 2016-10-23 DIAGNOSIS — E049 Nontoxic goiter, unspecified: Secondary | ICD-10-CM | POA: Diagnosis not present

## 2016-10-27 ENCOUNTER — Encounter: Payer: Medicare Other | Admitting: Physical Therapy

## 2016-10-27 NOTE — Telephone Encounter (Signed)
Home OT ordered

## 2016-10-27 NOTE — Telephone Encounter (Signed)
Please enter Home Health Referral for OT with [facility].

## 2016-10-28 ENCOUNTER — Ambulatory Visit (INDEPENDENT_AMBULATORY_CARE_PROVIDER_SITE_OTHER): Payer: Medicare Other | Admitting: Internal Medicine

## 2016-10-28 ENCOUNTER — Encounter: Payer: Self-pay | Admitting: Internal Medicine

## 2016-10-28 VITALS — BP 104/60 | HR 84 | Temp 97.7°F | Resp 16

## 2016-10-28 DIAGNOSIS — I890 Lymphedema, not elsewhere classified: Secondary | ICD-10-CM

## 2016-10-28 DIAGNOSIS — J069 Acute upper respiratory infection, unspecified: Secondary | ICD-10-CM

## 2016-10-28 DIAGNOSIS — R8290 Unspecified abnormal findings in urine: Secondary | ICD-10-CM | POA: Diagnosis not present

## 2016-10-28 MED ORDER — AMOXICILLIN-POT CLAVULANATE 250-62.5 MG/5ML PO SUSR: 850.0000 mg | Freq: Two times a day (BID) | ORAL | 0 refills | Status: DC

## 2016-10-28 MED ORDER — AMOXICILLIN-POT CLAVULANATE 875-125 MG PO TABS: 1.0000 | ORAL_TABLET | Freq: Two times a day (BID) | ORAL | 0 refills | Status: DC

## 2016-10-28 NOTE — Assessment & Plan Note (Signed)
Will empirically treat since it takes too difficult to get a sample - requires cath augmentin for URI should hopefully treat possible a UTI as well per prior urine culture sensitivites

## 2016-10-28 NOTE — Assessment & Plan Note (Signed)
Given her history of aspiration pneumonia and not being able to obtain good history from her will start antibiotics for possible bacterial infection augmentin x 10 days Continue otc cold medications prn Call if no improvement

## 2016-10-28 NOTE — Progress Notes (Signed)
Subjective:    Patient ID: Sandy Barrett, female    DOB: 1932/05/25, 83 y.o.   MRN: [id number]  HPI She is here for an acute visit for cold symptoms.  She is here with her daughter who provides the history.  Her symptoms started about one week ago.    She is experiencing cough, gurgling in her chest.  Her daughter states the symptoms start at 5-6 am and after 30 min to 1 hr the symptoms are better.  In the past few days her symptoms have been lasting longer and occurring during the day.  She denies any known fever, chills, nasal congestion, sinus pain, sore throat  and wheezing.  She has tried taking neb treatments and tussin.    Her urine has a smell and her daughter is concerned she may have an infection.    She has been going to [facility] for lymphedema treatment and it is helping.  Being able to do these exercises at home would likely help more - I have ordered home OT.     Medications and allergies reviewed with patient and updated if appropriate.  Patient Active Problem List   Diagnosis Date Noted  . Venous congestion 10/08/2016  . Acquired lymphedema 09/11/2016  . Long term (current) use of anticoagulants [Z79.01] 09/10/2016  . Cough 08/23/2016  . Left hip pain 08/09/2016  . Yeast infection of the skin 07/01/2016  . Hematoma of abdominal wall, sequela 06/06/2016  . Abnormal stool color 05/18/2016  . Left leg swelling 05/18/2016  . Heat rash 05/04/2016  . Encounter for hospice care discussion   . Pressure injury of skin 04/27/2016  . Aspiration pneumonia ([place]) 04/20/2016  . Hemidiaphragm paralysis   . Wheelchair bound 02/03/2016  . Conjunctival hemorrhage of right eye 01/28/2016  . Ecchymosis 01/28/2016  . Vaginal bleeding 01/28/2016  . Gross hematuria 01/28/2016  . Discoloration of skin 09/26/2015  . Dermatitis 09/13/2015  . Essential thrombocytosis ([place]) 07/31/2015  . SVC (superior vena cava obstruction), chronic 07/20/2015  . Dysphagia  07/20/2015  . Elevated liver enzymes 07/20/2015  . Constipation 07/20/2015  . Thrombophlebitis of breast, right 06/18/2015  . Tracheal deviation 06/06/2015  . Sensorineural hearing loss, bilateral, moderate-moderately severe 04/30/2015  . Abdominal wall lump 03/09/2015  . Frequent UTI 02/06/2015  . Primary osteoarthritis involving multiple joints 07/26/2014  . Pernicious anemia 07/26/2014  . Hearing loss 07/26/2014  . Encounter for therapeutic drug monitoring 07/17/2014  . Spastic hemiparesis affecting dominant side ([place]), right 05/31/2014  . DVT of lower extremity, bilateral ([place]) 03/14/2014  . Global aphasia 03/14/2014  . 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 Aphasia S/P CVA 03/13/2014  . Cerebral infarction due to embolism of left middle cerebral artery ([place])   . Stroke, embolic ([place]) 09/27/3005  . History of pulmonary embolism 03/08/2014  . Back pain 08/29/2013  . Primary localized osteoarthrosis, lower leg 03/06/2013  . IBS (irritable bowel syndrome)   . Multinodular goiter 01/13/2011  . Hyperthyroidism 11/11/2009  . Insomnia 07/03/2009  . POSTTRAUMATIC STRESS DISORDER 08/22/2008  . OBESITY 04/21/2008  . Osteoarthritis 04/21/2008  . MIGRAINE HEADACHE 04/20/2008  . Essential hypertension 04/20/2008    Current Outpatient Prescriptions on File Prior to Visit  Medication Sig Dispense Refill  . acetaminophen (TYLENOL) 160 MG/5ML liquid Take 480 mg by mouth every 4 (four) hours as needed for fever or pain.    [name] albuterol (PROVENTIL HFA) 108 (90 Base) MCG/ACT inhaler Inhale 2 puffs into the lungs every 6 (six)  hours as needed for wheezing or shortness of breath. 1 Inhaler 1  . diclofenac sodium (VOLTAREN) 1 % GEL APPLY (2GMS) TOPICALLY THREE TIMES DAILY. 300 g 5  . gabapentin (NEURONTIN) 100 MG capsule Take 1 capsule (100 mg total) by mouth 2 (two) times daily. 60 capsule 2  . guaiFENesin (MUCINEX) 600 MG 12 hr tablet Take 1 tablet (600 mg total) by mouth 2 (two) times daily.  (Patient taking differently: Take 600 mg by mouth 2 (two) times daily as needed for cough or to loosen phlegm. ) 10 tablet 0  . hydroxyurea (HYDREA) 500 MG capsule Take 1 tablet (500 mg) Monday through Friday, take 2 tablets ( 1000 mg ) Saturday and Sunday 90 capsule 3  . ipratropium-albuterol (DUONEB) 0.5-2.5 (3) MG/3ML SOLN Take 3 mLs by nebulization every 6 (six) hours as needed. (Patient taking differently: Take 3 mLs by nebulization every 6 (six) hours as needed (shortness of breath). ) 360 mL 0  . methimazole (TAPAZOLE) 5 MG tablet TAKE 1 TABLET BY MOUTH TWICE WEEKLY 12 tablet 1  . nystatin (MYCOSTATIN/NYSTOP) powder Apply topically 3 (three) times daily. 30 g 0  . polyethylene glycol (MIRALAX / GLYCOLAX) packet TAKE 17G BY MOUTH TWICE A DAY (Patient taking differently: TAKE 17G BY MOUTH TWICE A DAY AS NEED FOR CONSTIPATION) 14 packet 1  . Probiotic Product (ALIGN) 4 MG CAPS Take 1 capsule by mouth daily.     . propranolol (INDERAL) 10 MG tablet Take 0.5 tablets (5 mg total) by mouth 2 (two) times daily. 60 tablet 5  . PROTONIX 40 MG PACK TAKE 20 MLS (40 MG TOTAL) BY MOUTH DAILY. 30 each 5  . terconazole (TERAZOL 7) 0.4 % vaginal cream Place 1 applicator vaginally at bedtime. (Patient taking differently: Place 1 applicator vaginally at bedtime as needed (irritation). ) 90 g 3  . traMADol (ULTRAM) 50 MG tablet TAKE 2 TABLETS 3 TIMES A DAY AS NEEDED 180 tablet 1  . traZODone (DESYREL) 50 MG tablet TAKE 1 TABLET (50 MG TOTAL) BY MOUTH AT BEDTIME AS NEEDED. FOR SLEEP 30 tablet 5  . triamcinolone ointment (KENALOG) 0.5 % Apply 1 application topically 2 (two) times daily. 30 g 0  . warfarin (COUMADIN) 2 MG tablet Take 2 tablets (4 mg total) by mouth one time only at 6 PM. 60 tablet 1   No current facility-administered medications on file prior to visit.     Past Medical History:  Diagnosis Date  . ARTHRITIS   . Arthritis   . Diverticulosis   . HYPERTENSION   . HYPERTHYROIDISM   .  Hyperthyroidism    s/p I-131 ablation 03/2011 of multinod goiter  . INSOMNIA   . MIGRAINE HEADACHE   . OBESITY   . Posttraumatic stress disorder   . Pulmonary embolism ([place]) 03/2014   with DVT  . Stroke [facility]) 03/2014   dysarthria    Past Surgical History:  Procedure Laterality Date  . ABDOMINAL HYSTERECTOMY  1976  . BREAST SURGERY     biopsy  . RADIOLOGY WITH ANESTHESIA Left 03/08/2014   Procedure: RADIOLOGY WITH ANESTHESIA;  Surgeon: Rob Hickman, MD;  Location: [place];  Service: Radiology;  Laterality: Left;  . TONSILLECTOMY    . TOTAL HIP ARTHROPLASTY  1998    right    Social History   Social History  . Marital status: Widowed    Spouse name: N/A  . Number of children: 2  . Years of education: 14   Occupational History  .  retired     Restaurant manager, fast food   Social History Main Topics  . Smoking status: Never Smoker  . Smokeless tobacco: Never Used  . Alcohol use No  . Drug use: No  . Sexual activity: No   Other Topics Concern  . None   Social History Narrative   Widowed, lived alone prior to CVA 03/2014   SNF at [facility], then home 07/2014 with dtr   Right handed   Caffeine use- occasionally drinks tea    Family History  Problem Relation Age of Onset  . Asthma Mother   . Asthma Father   . Prostate cancer Father   . Stroke Brother   . Breast cancer Sister   . Stomach cancer Sister   . Thyroid disease Neg Hx     Review of Systems  Constitutional: Negative for chills and fever.  HENT: Positive for ear pain (pressure). Negative for congestion, rhinorrhea, sinus pain and sore throat.   Respiratory: Positive for cough and shortness of breath. Negative for wheezing.   Neurological: Negative for light-headedness.       Objective:   Vitals:   10/28/16 1618  BP: 104/60  Pulse: 84  Resp: 16  Temp: 97.7 F (36.5 C)  SpO2: 92%   There were no vitals filed for this visit. There is no height or weight on file to calculate BMI.  Wt Readings  from Last 3 Encounters:  10/06/16 176 lb (79.8 kg)  05/19/16 154 lb 4.8 oz (70 kg)  04/29/16 164 lb 12.8 oz (74.8 kg)     Physical Exam GENERAL APPEARANCE: Appears stated age, well appearing, NAD EYES: conjunctiva clear, no icterus HEENT: bilateral tympanic membranes and ear canals normal, oropharynx with mild erythema, no thyromegaly, trachea midline, no cervical or supraclavicular lymphadenopathy LUNGS: poor respiratory effort, no obvious wheeze or crackles HEART: Normal S1,S2 without murmurs; 2 + edema b/l LE SKIN: warm, dry        Assessment & Plan:   See Problem List for Assessment and Plan of chronic medical problems.

## 2016-10-28 NOTE — Patient Instructions (Signed)
Take the antibiotic as prescribed.  Continue the over the counter cold medications as needed.    Call if no improvement

## 2016-10-28 NOTE — Assessment & Plan Note (Signed)
Doing [facility]  Ordered home OT for lymphedema

## 2016-10-29 ENCOUNTER — Encounter: Payer: Medicare Other | Admitting: Physical Therapy

## 2016-10-30 ENCOUNTER — Ambulatory Visit (INDEPENDENT_AMBULATORY_CARE_PROVIDER_SITE_OTHER): Payer: Medicare Other | Admitting: Vascular Surgery

## 2016-10-30 ENCOUNTER — Encounter: Payer: Self-pay | Admitting: Vascular Surgery

## 2016-10-30 VITALS — BP 118/81 | HR 86 | Temp 97.2°F | Resp 20 | Ht 60.0 in | Wt 176.0 lb

## 2016-10-30 DIAGNOSIS — M7989 Other specified soft tissue disorders: Secondary | ICD-10-CM | POA: Diagnosis not present

## 2016-10-30 NOTE — Progress Notes (Signed)
Patient ID: Sandy Barrett, female   DOB: 10-24-1932, 83 y.o.   MRN: [id number]  Reason for Consult: AAA (DVT in L Iliac vein.  LLE swelling.  Epic referral Dr. Quay Burow. )   Referred by [name], MD  Subjective:     HPI:  Sandy Barrett is a 83 y.o. female with a history of DVT and was seen here in 2016.  She was recently admitted with large DVT in May and is now on Coumadin.  She does have chronic swelling of the left lower extremity does not have skin changes does not have tissue loss or ulceration.  She is nonambulatory from a previous stroke she can move the left leg not the right leg.  She does not have varicose veins.  All history is obtained from her daughter given that she is nonverbal from previous stroke.  Past Medical History:  Diagnosis Date  . ARTHRITIS   . Arthritis   . Diverticulosis   . HYPERTENSION   . HYPERTHYROIDISM   . Hyperthyroidism    s/p I-131 ablation 03/2011 of multinod goiter  . INSOMNIA   . MIGRAINE HEADACHE   . OBESITY   . Posttraumatic stress disorder   . Pulmonary embolism ([place]) 03/2014   with DVT  . Stroke [facility]) 03/2014   dysarthria   Family History  Problem Relation Age of Onset  . Asthma Mother   . Asthma Father   . Prostate cancer Father   . Stroke Brother   . Breast cancer Sister   . Stomach cancer Sister   . Thyroid disease Neg Hx    Past Surgical History:  Procedure Laterality Date  . ABDOMINAL HYSTERECTOMY  1976  . BREAST SURGERY     biopsy  . RADIOLOGY WITH ANESTHESIA Left 03/08/2014   Procedure: RADIOLOGY WITH ANESTHESIA;  Surgeon: Rob Hickman, MD;  Location: [place];  Service: Radiology;  Laterality: Left;  . TONSILLECTOMY    . TOTAL HIP ARTHROPLASTY  1998    right    Short Social History:  Social History  Substance Use Topics  . Smoking status: Never Smoker  . Smokeless tobacco: Never Used  . Alcohol use No    No Known Allergies  Current Outpatient Prescriptions  Medication Sig Dispense Refill  .  acetaminophen (TYLENOL) 160 MG/5ML liquid Take 480 mg by mouth every 4 (four) hours as needed for fever or pain.    [name] albuterol (PROVENTIL HFA) 108 (90 Base) MCG/ACT inhaler Inhale 2 puffs into the lungs every 6 (six) hours as needed for wheezing or shortness of breath. 1 Inhaler 1  . amoxicillin-clavulanate (AUGMENTIN) 250-62.5 MG/5ML suspension Take 17 mLs (850 mg total) by mouth 2 (two) times daily. 350 mL 0  . diclofenac sodium (VOLTAREN) 1 % GEL APPLY (2GMS) TOPICALLY THREE TIMES DAILY. 300 g 5  . gabapentin (NEURONTIN) 100 MG capsule Take 1 capsule (100 mg total) by mouth 2 (two) times daily. 60 capsule 2  . guaiFENesin (MUCINEX) 600 MG 12 hr tablet Take 1 tablet (600 mg total) by mouth 2 (two) times daily. (Patient taking differently: Take 600 mg by mouth 2 (two) times daily as needed for cough or to loosen phlegm. ) 10 tablet 0  . hydroxyurea (HYDREA) 500 MG capsule Take 1 tablet (500 mg) Monday through Friday, take 2 tablets ( 1000 mg ) Saturday and Sunday 90 capsule 3  . ipratropium-albuterol (DUONEB) 0.5-2.5 (3) MG/3ML SOLN Take 3 mLs by nebulization every 6 (six) hours as needed. (Patient taking  differently: Take 3 mLs by nebulization every 6 (six) hours as needed (shortness of breath). ) 360 mL 0  . methimazole (TAPAZOLE) 5 MG tablet TAKE 1 TABLET BY MOUTH TWICE WEEKLY 12 tablet 1  . nystatin (MYCOSTATIN/NYSTOP) powder Apply topically 3 (three) times daily. 30 g 0  . polyethylene glycol (MIRALAX / GLYCOLAX) packet TAKE 17G BY MOUTH TWICE A DAY (Patient taking differently: TAKE 17G BY MOUTH TWICE A DAY AS NEED FOR CONSTIPATION) 14 packet 1  . Probiotic Product (ALIGN) 4 MG CAPS Take 1 capsule by mouth daily.     . propranolol (INDERAL) 10 MG tablet Take 0.5 tablets (5 mg total) by mouth 2 (two) times daily. 60 tablet 5  . PROTONIX 40 MG PACK TAKE 20 MLS (40 MG TOTAL) BY MOUTH DAILY. 30 each 5  . terconazole (TERAZOL 7) 0.4 % vaginal cream Place 1 applicator vaginally at bedtime. (Patient  taking differently: Place 1 applicator vaginally at bedtime as needed (irritation). ) 90 g 3  . traMADol (ULTRAM) 50 MG tablet TAKE 2 TABLETS 3 TIMES A DAY AS NEEDED 180 tablet 1  . traZODone (DESYREL) 50 MG tablet TAKE 1 TABLET (50 MG TOTAL) BY MOUTH AT BEDTIME AS NEEDED. FOR SLEEP 30 tablet 5  . triamcinolone ointment (KENALOG) 0.5 % Apply 1 application topically 2 (two) times daily. 30 g 0  . warfarin (COUMADIN) 2 MG tablet Take 2 tablets (4 mg total) by mouth one time only at 6 PM. 60 tablet 1   No current facility-administered medications for this visit.     Review of Systems  Constitutional:  Constitutional negative. HENT: HENT negative.  Eyes: Eyes negative.  Respiratory: Respiratory negative.  Cardiovascular: Positive for leg swelling.  Musculoskeletal: Musculoskeletal negative.  Skin: Skin negative.  Neurological: Neurological negative. Hematologic: Positive for bruises/bleeds easily.  Psychiatric: Psychiatric negative.        Objective:  Objective   Vitals:   10/30/16 1248  BP: 118/81  Pulse: 86  Resp: 20  Temp: (!) 97.2 F (36.2 C)  TempSrc: Oral  SpO2: 92%  Weight: 176 lb (79.8 kg)  Height: 5' (1.524 m)   Body mass index is 34.37 kg/m.  Physical Exam  Constitutional: She is oriented to person, place, and time. She appears well-developed.  HENT:  Head: Normocephalic.  Eyes: Pupils are equal, round, and reactive to light.  Neck: Neck supple.  Cardiovascular: Normal rate.   Pulses:      Radial pulses are 2+ on the right side, and 2+ on the left side.  Pulmonary/Chest: Effort normal.  Abdominal: Soft. She exhibits no mass.  Musculoskeletal: She exhibits edema.  Neurological: She is alert and oriented to person, place, and time.  Skin: Skin is warm and dry.  Psychiatric: She has a normal mood and affect. Her behavior is normal. Judgment and thought content normal.    Data:      Assessment/Plan:     83 year old female follows up for left lower  extremity swelling without skin changes.  She does have a history of iliofemoral clotting now maintained on Coumadin.  I discussed with the daughter she needs to elevate the leg when completely immobile exercise would also be good to get the muscles working and she should most importantly have compression stockings that she will tolerate.  The daughter demonstrates good understanding and she can follow-up on a as needed basis.     Waynetta Sandy MD [facility]

## 2016-11-02 ENCOUNTER — Other Ambulatory Visit: Payer: Self-pay | Admitting: Emergency Medicine

## 2016-11-02 ENCOUNTER — Telehealth: Payer: Self-pay | Admitting: General Practice

## 2016-11-02 ENCOUNTER — Encounter: Payer: Medicare Other | Admitting: Physical Therapy

## 2016-11-02 MED ORDER — AZITHROMYCIN 200 MG/5ML PO SUSR: 400.0000 mg | Freq: Every day | ORAL | 0 refills | Status: AC

## 2016-11-02 NOTE — Telephone Encounter (Signed)
LVM informing pts daughter

## 2016-11-02 NOTE — Telephone Encounter (Signed)
Pts daughter called and states pt has a rash across her chest, she is not sure if it is coming from the antibiotic. She has taken amoxicillin before but not in the liquid form. Please advise.

## 2016-11-02 NOTE — Telephone Encounter (Signed)
It should not necessarily be because of a liquid form.  It may be from the antibiotic.  She should probably stop it.  We can try something different if she wants.

## 2016-11-02 NOTE — Telephone Encounter (Signed)
Patient is taking a 10 day round of augmentin.  I instructed patient's daughter, [name] to give patient 2 mg of coumadin daily until finished with antibiotic and then change back to current dosage.  Daughter verbalized understanding.

## 2016-11-02 NOTE — Telephone Encounter (Signed)
We can try a liquid zpak -- dosing has to be modified due to it being liquid

## 2016-11-03 ENCOUNTER — Ambulatory Visit: Payer: Medicare Other | Admitting: Internal Medicine

## 2016-11-04 ENCOUNTER — Ambulatory Visit: Payer: Medicare Other

## 2016-11-05 ENCOUNTER — Ambulatory Visit: Payer: Medicare Other | Admitting: Internal Medicine

## 2016-11-06 ENCOUNTER — Telehealth: Payer: Self-pay | Admitting: Emergency Medicine

## 2016-11-06 MED ORDER — CEFPODOXIME PROXETIL 100 MG/5ML PO SUSR: 200.0000 mg | Freq: Two times a day (BID) | ORAL | 0 refills | Status: DC

## 2016-11-06 NOTE — Telephone Encounter (Signed)
LVM informing pts daughter

## 2016-11-06 NOTE — Telephone Encounter (Signed)
I sent an additional antibiotic to her POF for her to take - liquid for 5 days.

## 2016-11-06 NOTE — Telephone Encounter (Signed)
Pts daughter called to advise that pt has one more day of antibiotics and does not seem to be much better, I advised that the antibiotic should stay in her sysytem 7-10 days after completing. She would like to know if there is anything else she can add to the antibiotic to help. She also still has a Rash that is dark and thick on right breast and back in the middle of her back even after switching the antibiotics.

## 2016-11-09 ENCOUNTER — Ambulatory Visit (INDEPENDENT_AMBULATORY_CARE_PROVIDER_SITE_OTHER): Payer: Medicare Other | Admitting: Nurse Practitioner

## 2016-11-09 ENCOUNTER — Encounter: Payer: Self-pay | Admitting: Nurse Practitioner

## 2016-11-09 VITALS — BP 82/56 | HR 89 | Temp 97.8°F | Ht 60.0 in

## 2016-11-09 DIAGNOSIS — B029 Zoster without complications: Secondary | ICD-10-CM | POA: Diagnosis not present

## 2016-11-09 MED ORDER — CALAMINE EX LOTN: 1.0000 "application " | TOPICAL_LOTION | CUTANEOUS | 0 refills | Status: DC | PRN

## 2016-11-09 MED ORDER — VALACYCLOVIR HCL 1 G PO TABS: 1000.0000 mg | ORAL_TABLET | Freq: Three times a day (TID) | ORAL | 0 refills | Status: DC

## 2016-11-09 MED ORDER — LIDOCAINE 5 % EX PTCH: 1.0000 | MEDICATED_PATCH | CUTANEOUS | 0 refills | Status: AC

## 2016-11-09 NOTE — Progress Notes (Signed)
Subjective:  Patient ID: Sandy Barrett, female    DOB: Nov 29, 1932  Age: 83 y.o. MRN: [id number]  CC: Rash (was on abx--rash on right breast and right back/ dark,raise,open spot. 1 wk ago) and Leg Pain (right leg pain cramps,inactive leg, knot on right knee  hx of blood clot?)  Rash  This is a new problem. The current episode started 1 to 4 weeks ago. The problem is unchanged. The affected locations include the chest and back. The rash is characterized by blistering and redness. It is unknown if there was an exposure to a precipitant. Associated symptoms include fatigue. Pertinent negatives include no fever or shortness of breath. Past treatments include nothing. Her past medical history is significant for varicella.  Knee Pain   The incident occurred more than 1 week ago. There was no injury mechanism. The pain is present in the right knee. The quality of the pain is described as cramping. The pain has been intermittent since onset. Associated symptoms include numbness and tingling. She reports no foreign bodies present. Nothing aggravates the symptoms. She has tried nothing for the symptoms.    accompanied by daughter who provides all information.  Outpatient Medications Prior to Visit  Medication Sig Dispense Refill  . acetaminophen (TYLENOL) 160 MG/5ML liquid Take 480 mg by mouth every 4 (four) hours as needed for fever or pain.    [name] albuterol (PROVENTIL HFA) 108 (90 Base) MCG/ACT inhaler Inhale 2 puffs into the lungs every 6 (six) hours as needed for wheezing or shortness of breath. 1 Inhaler 1  . cefpodoxime (VANTIN) 100 MG/5ML suspension Take 10 mLs (200 mg total) by mouth 2 (two) times daily. 100 mL 0  . diclofenac sodium (VOLTAREN) 1 % GEL APPLY (2GMS) TOPICALLY THREE TIMES DAILY. 300 g 5  . gabapentin (NEURONTIN) 100 MG capsule Take 1 capsule (100 mg total) by mouth 2 (two) times daily. 60 capsule 2  . hydroxyurea (HYDREA) 500 MG capsule Take 1 tablet (500 mg) Monday through Friday,  take 2 tablets ( 1000 mg ) Saturday and Sunday 90 capsule 3  . ipratropium-albuterol (DUONEB) 0.5-2.5 (3) MG/3ML SOLN Take 3 mLs by nebulization every 6 (six) hours as needed. (Patient taking differently: Take 3 mLs by nebulization every 6 (six) hours as needed (shortness of breath). ) 360 mL 0  . methimazole (TAPAZOLE) 5 MG tablet TAKE 1 TABLET BY MOUTH TWICE WEEKLY 12 tablet 1  . polyethylene glycol (MIRALAX / GLYCOLAX) packet TAKE 17G BY MOUTH TWICE A DAY (Patient taking differently: TAKE 17G BY MOUTH TWICE A DAY AS NEED FOR CONSTIPATION) 14 packet 1  . Probiotic Product (ALIGN) 4 MG CAPS Take 1 capsule by mouth daily.     . propranolol (INDERAL) 10 MG tablet Take 0.5 tablets (5 mg total) by mouth 2 (two) times daily. 60 tablet 5  . PROTONIX 40 MG PACK TAKE 20 MLS (40 MG TOTAL) BY MOUTH DAILY. 30 each 5  . terconazole (TERAZOL 7) 0.4 % vaginal cream Place 1 applicator vaginally at bedtime. (Patient taking differently: Place 1 applicator vaginally at bedtime as needed (irritation). ) 90 g 3  . traMADol (ULTRAM) 50 MG tablet TAKE 2 TABLETS 3 TIMES A DAY AS NEEDED 180 tablet 1  . traZODone (DESYREL) 50 MG tablet TAKE 1 TABLET (50 MG TOTAL) BY MOUTH AT BEDTIME AS NEEDED. FOR SLEEP 30 tablet 5  . triamcinolone ointment (KENALOG) 0.5 % Apply 1 application topically 2 (two) times daily. 30 g 0  . warfarin (COUMADIN) 2  MG tablet Take 2 tablets (4 mg total) by mouth one time only at 6 PM. 60 tablet 1  . nystatin (MYCOSTATIN/NYSTOP) powder Apply topically 3 (three) times daily. (Patient not taking: Reported on 11/09/2016) 30 g 0  . amoxicillin-clavulanate (AUGMENTIN) 250-62.5 MG/5ML suspension Take 17 mLs (850 mg total) by mouth 2 (two) times daily. (Patient not taking: Reported on 11/09/2016) 350 mL 0  . guaiFENesin (MUCINEX) 600 MG 12 hr tablet Take 1 tablet (600 mg total) by mouth 2 (two) times daily. (Patient not taking: Reported on 11/09/2016) 10 tablet 0   No facility-administered medications prior to  visit.     ROS See HPI  Objective:  BP (!) 82/56 (BP Location: Left Arm, Patient Position: Sitting, Cuff Size: Small)   Pulse 89   Temp 97.8 F (36.6 C)   Ht 5' (1.524 m)   SpO2 93%   BMI 34.37 kg/m   BP Readings from Last 3 Encounters:  11/09/16 (!) 82/56  10/30/16 118/81  10/28/16 104/60    Wt Readings from Last 3 Encounters:  10/30/16 176 lb (79.8 kg)  10/06/16 176 lb (79.8 kg)  05/19/16 154 lb 4.8 oz (70 kg)    Physical Exam  Pulmonary/Chest:        Musculoskeletal:       Right knee: She exhibits decreased range of motion. She exhibits no swelling, no effusion, no erythema and no bony tenderness. No tenderness found.  Skin: Skin is warm and dry. Rash noted. Rash is vesicular. There is erythema.    Lab Results  Component Value Date   WBC 5.4 10/06/2016   HGB 14.1 10/06/2016   HCT 43.5 10/06/2016   PLT 259 Clumped Platelets--Appears Adequate 10/06/2016   GLUCOSE 90 10/06/2016   CHOL 95 03/09/2014   TRIG 54 03/09/2014   HDL 31 (L) 03/09/2014   LDLCALC 53 03/09/2014   ALT 13 (L) 01/26/2016   AST 16 01/26/2016   NA 142 10/06/2016   K 4.1 10/06/2016   CL 108 05/22/2016   CREATININE 0.6 10/06/2016   BUN 12.9 10/06/2016   CO2 27 10/06/2016   TSH 1.15 08/03/2016   INR 3.0 10/22/2016   HGBA1C 5.4 03/09/2014    No results found.  Assessment & Plan:   Sandy was seen today for rash and leg pain.  Diagnoses and all orders for this visit:  Herpes zoster without complication -     valACYclovir (VALTREX) 1000 MG tablet; Take 1 tablet (1,000 mg total) 3 (three) times daily by mouth. -     lidocaine (LIDODERM) 5 %; Place 1 patch daily onto the skin. Remove & Discard patch within 12 hours or as directed by MD -     calamine lotion; Apply 1 application as needed topically for itching.   I have discontinued Sandy Barrett's guaiFENesin and amoxicillin-clavulanate. I am also having her start on valACYclovir, lidocaine, and calamine. Additionally, I am  having her maintain her ALIGN, terconazole, albuterol, diclofenac sodium, polyethylene glycol, acetaminophen, ipratropium-albuterol, propranolol, warfarin, triamcinolone ointment, nystatin, gabapentin, PROTONIX, traZODone, traMADol, methimazole, hydroxyurea, and cefpodoxime.  Meds ordered this encounter  Medications  . valACYclovir (VALTREX) 1000 MG tablet    Sig: Take 1 tablet (1,000 mg total) 3 (three) times daily by mouth.    Dispense:  21 tablet    Refill:  0    Order Specific Question:   Supervising Provider    Answer:   Cassandria Anger [[id number]]  . lidocaine (LIDODERM) 5 %    Sig: Place 1  patch daily onto the skin. Remove & Discard patch within 12 hours or as directed by MD    Dispense:  15 patch    Refill:  0    Order Specific Question:   Supervising Provider    Answer:   Cassandria Anger [[id number]]  . calamine lotion    Sig: Apply 1 application as needed topically for itching.    Dispense:  120 mL    Refill:  0    Order Specific Question:   Supervising Provider    Answer:   Cassandria Anger [[id number]]    Follow-up: No Follow-up on file.  Wilfred Lacy, NP

## 2016-11-09 NOTE — Patient Instructions (Addendum)
Increase gabapentin to 2caps twice a day.  Check BP at home once a day. Call office if BP persistently <90/60.  No indication of right leg DVT.  Shingles Shingles is an infection that causes a painful skin rash and fluid-filled blisters. Shingles is caused by the same virus that causes chickenpox. Shingles only develops in people who:  Have had chickenpox.  Have gotten the chickenpox vaccine. (This is rare.)  The first symptoms of shingles may be itching, tingling, or pain in an area on your skin. A rash will follow in a few days or weeks. The rash is usually on one side of the body in a bandlike or beltlike pattern. Over time, the rash turns into fluid-filled blisters that break open, scab over, and dry up. Medicines may:  Help you manage pain.  Help you recover more quickly.  Help to prevent long-term problems.  Follow these instructions at home: Medicines  Take medicines only as told by your doctor.  Apply an anti-itch or numbing cream to the affected area as told by your doctor. Blister and Rash Care  Take a cool bath or put cool compresses on the area of the rash or blisters as told by your doctor. This may help with pain and itching.  Keep your rash covered with a loose bandage (dressing). Wear loose-fitting clothing.  Keep your rash and blisters clean with mild soap and cool water or as told by your doctor.  Check your rash every day for signs of infection. These include redness, swelling, and pain that lasts or gets worse.  Do not pick your blisters.  Do not scratch your rash. General instructions  Rest as told by your doctor.  Keep all follow-up visits as told by your doctor. This is important.  Until your blisters scab over, your infection can cause chickenpox in people who have never had it or been vaccinated against it. To prevent this from happening, avoid touching other people or being around other people, especially: ? Babies. ? Pregnant  women. ? Children who have eczema. ? Elderly people who have transplants. ? People who have chronic illnesses, such as leukemia or AIDS. Contact a doctor if:  Your pain does not get better with medicine.  Your pain does not get better after the rash heals.  Your rash looks infected. Signs of infection include: ? Redness. ? Swelling. ? Pain that lasts or gets worse. Get help right away if:  The rash is on your face or nose.  You have pain in your face, pain around your eye area, or loss of feeling on one side of your face.  You have ear pain or you have ringing in your ear.  You have loss of taste.  Your condition gets worse. This information is not intended to replace advice given to you by your health care provider. Make sure you discuss any questions you have with your health care provider. Document Released: 06/10/2007 Document Revised: 08/18/2015 Document Reviewed: 10/03/2013 Elsevier Interactive Patient Education  [employer].

## 2016-11-10 ENCOUNTER — Encounter: Payer: Self-pay | Admitting: Nurse Practitioner

## 2016-11-12 ENCOUNTER — Other Ambulatory Visit: Payer: Self-pay | Admitting: Internal Medicine

## 2016-11-12 ENCOUNTER — Telehealth: Payer: Self-pay | Admitting: Emergency Medicine

## 2016-11-12 DIAGNOSIS — I69959 Hemiplegia and hemiparesis following unspecified cerebrovascular disease affecting unspecified side: Secondary | ICD-10-CM

## 2016-11-12 DIAGNOSIS — I890 Lymphedema, not elsewhere classified: Secondary | ICD-10-CM

## 2016-11-12 NOTE — Progress Notes (Signed)
Ref hom

## 2016-11-12 NOTE — Telephone Encounter (Signed)
PA completed, KEY [id number]  PA was approved, pharmacy notified.

## 2016-11-17 ENCOUNTER — Ambulatory Visit (INDEPENDENT_AMBULATORY_CARE_PROVIDER_SITE_OTHER): Payer: Medicare Other | Admitting: Nurse Practitioner

## 2016-11-17 ENCOUNTER — Encounter: Payer: Self-pay | Admitting: Nurse Practitioner

## 2016-11-17 ENCOUNTER — Other Ambulatory Visit: Payer: Self-pay | Admitting: Nurse Practitioner

## 2016-11-17 ENCOUNTER — Ambulatory Visit (INDEPENDENT_AMBULATORY_CARE_PROVIDER_SITE_OTHER)
Admission: RE | Admit: 2016-11-17 | Discharge: 2016-11-17 | Disposition: A | Discharge status: Home / Self Care | Payer: Medicare Other | Source: Ambulatory Visit | Attending: Nurse Practitioner | Admitting: Nurse Practitioner

## 2016-11-17 ENCOUNTER — Ambulatory Visit (INDEPENDENT_AMBULATORY_CARE_PROVIDER_SITE_OTHER): Payer: Medicare Other | Admitting: General Practice

## 2016-11-17 VITALS — BP 108/70 | HR 85 | Temp 97.4°F | Ht 60.0 in | Wt 180.0 lb

## 2016-11-17 DIAGNOSIS — Z7901 Long term (current) use of anticoagulants: Secondary | ICD-10-CM

## 2016-11-17 DIAGNOSIS — R059 Cough, unspecified: Secondary | ICD-10-CM

## 2016-11-17 DIAGNOSIS — R05 Cough: Secondary | ICD-10-CM

## 2016-11-17 LAB — POCT INR: INR: 2.6

## 2016-11-17 NOTE — Patient Instructions (Addendum)
Stop valacyclovir  No acute finding on CXR. I think cough and gagging was due to valacyclovir.

## 2016-11-17 NOTE — Patient Instructions (Addendum)
Pre visit review using our clinic review tool, if applicable. No additional management support is needed unless otherwise documented below in the visit note.  Continue to take 2 mg daily.  Re-check in 2 weeks.

## 2016-11-17 NOTE — Progress Notes (Signed)
Subjective:  Patient ID: Sandy Barrett, female    DOB: September 05, 1932  Age: 83 y.o. MRN: [id number]  CC: Cough (gaging and spit up clear phelgm---going on 3 days/ not sure if this is bc of med. )   Cough  This is a new problem. The current episode started in the past 7 days. The problem has been unchanged. The cough is productive of sputum. Pertinent negatives include no chills, fever, headaches, nasal congestion, postnasal drip, rhinorrhea, shortness of breath, sweats or wheezing.   Cough: Clear sputum Onset on Saturday. Onset after first dose of valacyclovir for shingles.  Accompanied by daughters. They provided all the history. They denies any PND of othopnea. No respiratory distress.  Outpatient Medications Prior to Visit  Medication Sig Dispense Refill  . acetaminophen (TYLENOL) 160 MG/5ML liquid Take 480 mg by mouth every 4 (four) hours as needed for fever or pain.    [name] albuterol (PROVENTIL HFA) 108 (90 Base) MCG/ACT inhaler Inhale 2 puffs into the lungs every 6 (six) hours as needed for wheezing or shortness of breath. 1 Inhaler 1  . calamine lotion Apply 1 application as needed topically for itching. 120 mL 0  . diclofenac sodium (VOLTAREN) 1 % GEL APPLY (2GMS) TOPICALLY THREE TIMES DAILY. 300 g 5  . gabapentin (NEURONTIN) 100 MG capsule Take 1 capsule (100 mg total) by mouth 2 (two) times daily. 60 capsule 2  . hydroxyurea (HYDREA) 500 MG capsule Take 1 tablet (500 mg) Monday through Friday, take 2 tablets ( 1000 mg ) Saturday and Sunday 90 capsule 3  . ipratropium-albuterol (DUONEB) 0.5-2.5 (3) MG/3ML SOLN Take 3 mLs by nebulization every 6 (six) hours as needed. (Patient taking differently: Take 3 mLs by nebulization every 6 (six) hours as needed (shortness of breath). ) 360 mL 0  . lidocaine (LIDODERM) 5 % Place 1 patch daily onto the skin. Remove & Discard patch within 12 hours or as directed by MD 15 patch 0  . methimazole (TAPAZOLE) 5 MG tablet TAKE 1 TABLET BY MOUTH TWICE  WEEKLY 12 tablet 1  . nystatin (MYCOSTATIN/NYSTOP) powder Apply topically 3 (three) times daily. 30 g 0  . polyethylene glycol (MIRALAX / GLYCOLAX) packet TAKE 17G BY MOUTH TWICE A DAY (Patient taking differently: TAKE 17G BY MOUTH TWICE A DAY AS NEED FOR CONSTIPATION) 14 packet 1  . Probiotic Product (ALIGN) 4 MG CAPS Take 1 capsule by mouth daily.     . propranolol (INDERAL) 10 MG tablet Take 0.5 tablets (5 mg total) by mouth 2 (two) times daily. 60 tablet 5  . PROTONIX 40 MG PACK TAKE 20 MLS (40 MG TOTAL) BY MOUTH DAILY. 30 each 5  . terconazole (TERAZOL 7) 0.4 % vaginal cream Place 1 applicator vaginally at bedtime. (Patient taking differently: Place 1 applicator vaginally at bedtime as needed (irritation). ) 90 g 3  . traMADol (ULTRAM) 50 MG tablet TAKE 2 TABLETS 3 TIMES A DAY AS NEEDED 180 tablet 1  . traZODone (DESYREL) 50 MG tablet TAKE 1 TABLET (50 MG TOTAL) BY MOUTH AT BEDTIME AS NEEDED. FOR SLEEP 30 tablet 5  . triamcinolone ointment (KENALOG) 0.5 % Apply 1 application topically 2 (two) times daily. 30 g 0  . warfarin (COUMADIN) 2 MG tablet Take 2 tablets (4 mg total) by mouth one time only at 6 PM. 60 tablet 1  . cefpodoxime (VANTIN) 100 MG/5ML suspension Take 10 mLs (200 mg total) by mouth 2 (two) times daily. 100 mL 0  . valACYclovir (VALTREX)  1000 MG tablet Take 1 tablet (1,000 mg total) 3 (three) times daily by mouth. 21 tablet 0   No facility-administered medications prior to visit.     ROS See HPI  Objective:  BP 108/70   Pulse 85   Temp (!) 97.4 F (36.3 C)   Ht 5' (1.524 m)   Wt 180 lb (81.6 kg)   SpO2 93%   BMI 35.15 kg/m   BP Readings from Last 3 Encounters:  11/17/16 108/70  11/09/16 (!) 82/56  10/30/16 118/81    Wt Readings from Last 3 Encounters:  11/17/16 180 lb (81.6 kg)  10/30/16 176 lb (79.8 kg)  10/06/16 176 lb (79.8 kg)    Physical Exam  Constitutional: She is oriented to person, place, and time. No distress.  Cardiovascular: Normal rate.    Pulmonary/Chest: Effort normal. No respiratory distress. She has rales.  Neurological: She is alert and oriented to person, place, and time.  Skin: Skin is warm and dry.  Vitals reviewed.   Lab Results  Component Value Date   WBC 5.4 10/06/2016   HGB 14.1 10/06/2016   HCT 43.5 10/06/2016   PLT 259 Clumped Platelets--Appears Adequate 10/06/2016   GLUCOSE 90 10/06/2016   CHOL 95 03/09/2014   TRIG 54 03/09/2014   HDL 31 (L) 03/09/2014   LDLCALC 53 03/09/2014   ALT 13 (L) 01/26/2016   AST 16 01/26/2016   NA 142 10/06/2016   K 4.1 10/06/2016   CL 108 05/22/2016   CREATININE 0.6 10/06/2016   BUN 12.9 10/06/2016   CO2 27 10/06/2016   TSH 1.15 08/03/2016   INR 2.6 11/17/2016   HGBA1C 5.4 03/09/2014    Assessment & Plan:   [name] was seen today for cough.  Diagnoses and all orders for this visit:  Cough -     Cancel: DG Chest 2 View; Future   I have discontinued [name] Meyerhoff's cefpodoxime and valACYclovir. I am also having her maintain her ALIGN, terconazole, albuterol, diclofenac sodium, polyethylene glycol, acetaminophen, ipratropium-albuterol, propranolol, warfarin, triamcinolone ointment, nystatin, gabapentin, PROTONIX, traZODone, traMADol, methimazole, hydroxyurea, lidocaine, and calamine.  No orders of the defined types were placed in this encounter.   Follow-up: No Follow-up on file.  Wilfred Lacy, NP

## 2016-11-18 ENCOUNTER — Ambulatory Visit: Payer: Medicare Other | Admitting: Nurse Practitioner

## 2016-11-23 ENCOUNTER — Ambulatory Visit: Payer: No Typology Code available for payment source | Admitting: Physical Therapy

## 2016-11-23 ENCOUNTER — Telehealth: Payer: Self-pay | Admitting: Internal Medicine

## 2016-11-23 NOTE — Telephone Encounter (Addendum)
FYI: Home Health has received referral and will be going out on 11/25/16

## 2016-11-24 NOTE — Telephone Encounter (Signed)
Pt requesting that home health start on 11/23

## 2016-11-24 NOTE — Progress Notes (Addendum)
[facility]  PATIENT: Sandy Barrett DOB: August 09, 1932   REASON FOR VISIT: Follow-up for stroke 03/08/2014 HISTORY FROM: Daughter [name]    HISTORY OF PRESENT ILLNESS: HISTORY PS81 year African-American lady who seen today for first office follow-up visit following hospital admission for stroke on 03/08/14. She is accompanied by 2 of her daughters. Patient presented with sudden onset of speech difficulties, right-sided weakness and facial droop. Her last known well was unknown when she had talked to her sister on the phone and she went for had appointment and 1 PM and had is immediately noted that she was confused and having speech difficulties. She presented to the emergency room with aphasia and right hemiparesis and right hemianopsia. Last seen normal was outside the window for TPA upon initial determination hence CT scan with perfusion was obtained which showed a large ischemic penumbra in the left MCA territory. After several telephonic discussions with family members it was determined that the last seen normal  Determined initially was incorrect inpatient was within the timeframe for tPA which was given without complications.. Patient however had widened mediastinum on chest x-ray requiring a CT scan of the chest to rule out aortic dissection and she was subsequently found to have a large thyroid mass with bilateral pulmonary emboli. IV TPA was administered. She was found to have left terminal carotid artery occlusion for which she underwent unsuccessful attempt at revascularization with mechanical embolectomy by Dr. the mesh were due to technical difficulties vessel could not be opened. She was admitted to intensive care unit and was initially intubated. She remained globally aphasic with dense right hemiplegia and visual field loss. 2-D echo showed normal ejection fraction but there was presence of interatrial septal aneurysm but bubble study and TEE were not done. She was  also found to have extensive bilateral deep and thrombosis and pulmonary emboli and hence despite the large stroke was started cautiously on IV heparin without bolus. MRI scan confirmed subsequently a moderate size left MCA infarct with nonhemorrhagic transformation. There are also tiny punctate foci of right MCA infarcts. MRA showed occluded terminal left ICA and MCA. Carotid ultrasound showed no significant extracranial stenosis but abnormal left ICA waveform suggestive of distal occlusion. Patient was found to have a large intrathoracic goiter with displacement of the trachea, aortic branches and superior vena cava with sluggish flow in the left jugular vein but no thrombus. LDL cholesterol is 53 hemoglobin A1c was 5.4. Protein C and S activity were normal and presented, and was negative. Telemetry monitoring did not reveal atrial fibrillation. Patient was transferred for rehabilitation and is currently living in a nursing home where she is on warfarin which is tolerating well without any bleeding complications. She remains aphasic with dense right hemiplegia. She is able to assist with transfers and can stand up with the one-person assist with physical therapy and gait belt walk with little. The family is getting ready to transfer the patient home over the next few weeks and have a lot of questions about her care and prognosis which  I answered. Update 02/18/2015 PS: She returns for follow-up after last visit in May 2016  She remains neurologically stable without recurrent stroke or TIA symptoms however the family has noticed that she often stares off into space and points towards things and they feel that she may be having visual hallucinations. She does not appear to be distress with this. These seem to be more pronounced in the last 3 weeks following her urinary tract infection.  She was initially treated with Cipro followed by a different antibiotic. Patient remains wheelchair bound and is total care and  family provides 24-hour care at home. She remains on Coumadin for her pulmonary embolism and is tolerating well without bleeding or bruising. She has not had any falls, head injury, or seizures that have been noted UPDATE August  28th 2017CM Sandy Barrett, 83 year old female returns for follow-up with daughter [name] with whom she lives. She remains neurologically stable without recurrent stroke or TIA symptoms. She remains on Coumadin for secondary stroke prevention and previous history of pulmonary emboli. Dementia labs at last visit were normal except for elevated TSH. Her thyroid medication was reduced. EEG in the office without evidence of seizure activity and mild generalized slowing which is nonspecific. Abnormal MRI brain (without) demonstrating:03/20/15  1. Few foci of left basal ganglia acute-subacute infarctions vs artifact due to relative signal difference compared to adjacent tissues. 2. Large chronic left MCA ischemic infarction with encephalomalacia and gliosis affecting the left frontal, parietal and temporal regions. Dystrophic calcification vs hemosiderin deposition in the left basal ganglia. Wallerian degeneration and atrophy of the left cerebral peduncle.  3. Occluded left ICA with decreased branching within the left MCA.  4. Compared to MRI on 03/09/14, there has been expected evolutional change of left MCA infarction. In addition, there may be a few areas of acute-subacute infarcts in the left basal ganglia vs artifact.  She returns for reevaluation. Daughter has CNA daily to assist with activities of daily living. UPDATE 03/14/2018CM Sandy Barrett, 83 year old female returns for follow-up with her daughter [name] who lives with her. Her Coumadin has been changed to eliquis due to bleeding  (conjunctival hemorrhage) and inconsistent INR. This was changed in February. She has not had recurrent stroke or TIA symptoms. She has not had any significant bruising on eliquis or bleeding. She  remains neurologically stable she continues to get Botox to her right hand and cortisone to her left knee osteoarthritis. According to the daughter she is sleeping well at night and  her appetite is good she has not had any choking spells. She is incontinent of bowel and bladder and wears briefs. Daughter uses a [employer] fast streamer to get her up every day. CNA helps 3 hours a day presently. She returns for reevaluation UPDATE 11/21/2018CM Sandy Barrett, 83 year old female returns for follow-up with her daughter with whom she lives.  She has a history of stroke event in 2016.  Since last seen in March she had a hospitalization for pneumonia and admission for  swelling of the left leg with a blood clot.  She was placed back on Coumadin and daughter reports she has had consistent INRs.  She has not had further stroke or TIA symptoms.  Neurologically she is stable, appetite is good and she sleeps well.  She is incontinent of bowel and bladder and is total care.  She gets up every day with a Hoyer lift.  She continues to get Botox to the right hand with Dr.Kirstens.  She has severe spasticity which interferes with ADL's and  Mobility.  She returns for reevaluation REVIEW OF SYSTEMS: Full 14 system review of systems performed and notable only for those listed, all others are neg:  Constitutional: neg  Cardiovascular: neg Ear/Nose/Throat: neg  Skin: neg Eyes: neg Respiratory: neg Gastroitestinal: neg  Genitourinary incontinence of bowel and bladder Hematology/Lymphatic: Easy bruising  Endocrine: neg Musculoskeletal: Walking difficulty , right-sided weakness spasticity Allergy/Immunology: neg Neurological: Speech difficulty Psychiatric: neg Sleep : neg  ALLERGIES: Allergies  Allergen Reactions  . Valtrex [Valacyclovir Hcl] Cough    Cough and gagging    HOME MEDICATIONS: Outpatient Medications Prior to Visit  Medication Sig Dispense Refill  . acetaminophen (TYLENOL) 160 MG/5ML liquid Take 480 mg by mouth  every 4 (four) hours as needed for fever or pain.    [name] albuterol (PROVENTIL HFA) 108 (90 Base) MCG/ACT inhaler Inhale 2 puffs into the lungs every 6 (six) hours as needed for wheezing or shortness of breath. 1 Inhaler 1  . calamine lotion Apply 1 application as needed topically for itching. 120 mL 0  . diclofenac sodium (VOLTAREN) 1 % GEL APPLY (2GMS) TOPICALLY THREE TIMES DAILY. 300 g 5  . gabapentin (NEURONTIN) 100 MG capsule Take 1 capsule (100 mg total) by mouth 2 (two) times daily. 60 capsule 2  . hydroxyurea (HYDREA) 500 MG capsule Take 1 tablet (500 mg) Monday through Friday, take 2 tablets ( 1000 mg ) Saturday and Sunday 90 capsule 3  . ipratropium-albuterol (DUONEB) 0.5-2.5 (3) MG/3ML SOLN Take 3 mLs by nebulization every 6 (six) hours as needed. (Patient taking differently: Take 3 mLs by nebulization every 6 (six) hours as needed (shortness of breath). ) 360 mL 0  . lidocaine (LIDODERM) 5 % Place 1 patch daily onto the skin. Remove & Discard patch within 12 hours or as directed by MD 15 patch 0  . methimazole (TAPAZOLE) 5 MG tablet TAKE 1 TABLET BY MOUTH TWICE WEEKLY 12 tablet 1  . nystatin (MYCOSTATIN/NYSTOP) powder Apply topically 3 (three) times daily. 30 g 0  . polyethylene glycol (MIRALAX / GLYCOLAX) packet TAKE 17G BY MOUTH TWICE A DAY (Patient taking differently: TAKE 17G BY MOUTH TWICE A DAY AS NEED FOR CONSTIPATION) 14 packet 1  . Probiotic Product (ALIGN) 4 MG CAPS Take 1 capsule by mouth daily.     . propranolol (INDERAL) 10 MG tablet Take 0.5 tablets (5 mg total) by mouth 2 (two) times daily. 60 tablet 5  . PROTONIX 40 MG PACK TAKE 20 MLS (40 MG TOTAL) BY MOUTH DAILY. 30 each 5  . terconazole (TERAZOL 7) 0.4 % vaginal cream Place 1 applicator vaginally at bedtime. (Patient taking differently: Place 1 applicator vaginally at bedtime as needed (irritation). ) 90 g 3  . traMADol (ULTRAM) 50 MG tablet TAKE 2 TABLETS 3 TIMES A DAY AS NEEDED 180 tablet 1  . traZODone (DESYREL) 50 MG  tablet TAKE 1 TABLET (50 MG TOTAL) BY MOUTH AT BEDTIME AS NEEDED. FOR SLEEP 30 tablet 5  . triamcinolone ointment (KENALOG) 0.5 % Apply 1 application topically 2 (two) times daily. 30 g 0  . warfarin (COUMADIN) 2 MG tablet Take 2 tablets (4 mg total) by mouth one time only at 6 PM. 60 tablet 1   No facility-administered medications prior to visit.     PAST MEDICAL HISTORY: Past Medical History:  Diagnosis Date  . ARTHRITIS   . Arthritis   . Diverticulosis   . HYPERTENSION   . HYPERTHYROIDISM   . Hyperthyroidism    s/p I-131 ablation 03/2011 of multinod goiter  . INSOMNIA   . MIGRAINE HEADACHE   . OBESITY   . Posttraumatic stress disorder   . Pulmonary embolism ([place]) 03/2014   with DVT  . Stroke [facility]) 03/2014   dysarthria    PAST SURGICAL HISTORY: Past Surgical History:  Procedure Laterality Date  . ABDOMINAL HYSTERECTOMY  1976  . BREAST SURGERY     biopsy  . RADIOLOGY WITH ANESTHESIA Left  03/08/2014   Procedure: RADIOLOGY WITH ANESTHESIA;  Surgeon: Rob Hickman, MD;  Location: [facility];  Service: Radiology;  Laterality: Left;  . TONSILLECTOMY    . TOTAL HIP ARTHROPLASTY  1998    right    FAMILY HISTORY: Family History  Problem Relation Age of Onset  . Asthma Mother   . Asthma Father   . Prostate cancer Father   . Stroke Brother   . Breast cancer Sister   . Stomach cancer Sister   . Thyroid disease Neg Hx     SOCIAL HISTORY: Social History   Socioeconomic History  . Marital status: Widowed    Spouse name: Not on file  . Number of children: 2  . Years of education: 34  . Highest education level: Not on file  Social Needs  . Financial resource strain: Not on file  . Food insecurity - worry: Not on file  . Food insecurity - inability: Not on file  . Transportation needs - medical: Not on file  . Transportation needs - non-medical: Not on file  Occupational History  . Occupation: retired    Comment: Restaurant manager, fast food  Tobacco Use  . Smoking status:  Never Smoker  . Smokeless tobacco: Never Used  Substance and Sexual Activity  . Alcohol use: No    Alcohol/week: 0.0 oz  . Drug use: No  . Sexual activity: No  Other Topics Concern  . Not on file  Social History Narrative   Widowed, lived alone prior to CVA 03/2014   SNF at [facility], then home 07/2014 with dtr   Right handed   Caffeine use- occasionally drinks tea     PHYSICAL EXAM  Vitals:   11/25/16 1412  BP: 111/70  Pulse: 87  Weight: 180 lb (81.6 kg)  Height: 5' (1.524 m)   Body mass index is 35.15 kg/m. General: Obese elderly African-American lady, seated, in no evident distress Head: head normocephalic and atraumatic.  Neck: supple with no carotid  bruits Cardiovascular: regular rate and rhythm, no murmurs Musculoskeletal: Right foot drop and wears foot brace and right hand brace    Neurological examination  Mental Status: Awake and fully alert. Globally aphasic and will follow only occasional commands and mimicking. Speech is  nonsensical. Will not follow any commands consistently.  Cranial Nerves: Pupils equal, briskly reactive to light. Extraocular movements full without nystagmus. Visual fields show dense right homonymous hemianopsia .Hearing intact. Facial sensation intact. Mild right lower facial weakness., tongue, palate moves normally and symmetrically.  Motor: Dense spastic right hemiplegia with 0/5 strength with right foot drop and hand and wrist flexion deformity. Increased tone on the right. Normal antigravity strength on the left. Sensory.: intact to touch ,pinprick .on the left but impaired on the right  Coordination: Accurate on the left and cannot be tested on the right.  Gait and Station: Not tested as patient sitting in wheelchair  Reflexes: 2+ and asymmetric much brisker on the right side Toes downgoing.   DIAGNOSTIC DATA (LABS, IMAGING, TESTING) - I reviewed patient records, labs, notes, testing and imaging myself where  available.  Lab Results  Component Value Date   WBC 5.4 10/06/2016   HGB 14.1 10/06/2016   HCT 43.5 10/06/2016   MCV 114.2 (H) 10/06/2016   PLT 259 Clumped Platelets--Appears Adequate 10/06/2016      Component Value Date/Time   NA 142 10/06/2016 1352   K 4.1 10/06/2016 1352   CL 108 05/22/2016 0409   CO2 27 10/06/2016 1352  GLUCOSE 90 10/06/2016 1352   BUN 12.9 10/06/2016 1352   CREATININE 0.6 10/06/2016 1352   CALCIUM 9.4 10/06/2016 1352   PROT 6.6 01/26/2016 2115   ALBUMIN 3.4 (L) 01/26/2016 2115   AST 16 01/26/2016 2115   ALT 13 (L) 01/26/2016 2115   ALKPHOS 61 01/26/2016 2115   BILITOT 0.5 01/26/2016 2115   GFRNONAA >60 05/22/2016 0409   GFRAA >60 05/22/2016 0409     Lab Results  Component Value Date   TSH 1.15 08/03/2016      ASSESSMENT AND PLAN 83year lady with large left middle cerebral artery infarct secondary to occlusion of the left internal carotid artery with concurrent bilateral lower extremity deep vein thrombosis and pulmonary embolism with strong suspicion for paradoxical emboli in March 2016 treated with IV TPA and failed mechanical thrombectomy with significant residual disability. EEG in the office without evidence of seizure activity and mild generalized slowing which is nonspecific. Abnormal MRI brain (without) demonstrating:03/20/15 Few foci of left basal ganglia acute-subacute infarctions vs artifact due to relative signal difference compared to adjacent tissues.. Large chronic left MCA ischemic infarction with encephalomalacia and gliosis affecting the left frontal, parietal and temporal regions. Dystrophic calcification vs hemosiderin deposition in the left basal ganglia. Wallerian degeneration and atrophy of the left cerebral peduncle. Occluded left ICA with decreased branching within the left MCA. Compared to MRI on 03/09/14, there has been expected evolutional change of left MCA infarction. In addition, there may be a few areas of acute-subacute  infarcts in the left basal ganglia vs artifact.She is back on Coumadin from eliquis after DVT.  Neurologic exam is unchanged The patient is a current patient of Dr. Leonie Man  who is out of the office today . This note is sent to the work in doctor.     PLAN: Continue Coumadin  for stroke prevention  maintain strict control of hypertension with blood pressure goal below 140/90, today's reading 111/70 Continue range of motion exercises HEP  Important to get patient up every day has [employer] fast streamer Continue supportive care Will discharge from stroke clinic Dennie Bible, [facility], [facility], [facility] [address], [place], [place] [address] [phone]  I reviewed the above note and documentation by the Nurse Practitioner and agree with the history, physical exam, assessment and plan as outlined above. I was immediately available for face-to-face consultation. Star Age, MD, PhD [facility])

## 2016-11-24 NOTE — Telephone Encounter (Signed)
Noted  

## 2016-11-25 ENCOUNTER — Ambulatory Visit (INDEPENDENT_AMBULATORY_CARE_PROVIDER_SITE_OTHER): Payer: Medicare Other | Admitting: Nurse Practitioner

## 2016-11-25 ENCOUNTER — Encounter: Payer: Self-pay | Admitting: Nurse Practitioner

## 2016-11-25 VITALS — BP 111/70 | HR 87 | Ht 60.0 in | Wt 180.0 lb

## 2016-11-25 DIAGNOSIS — I69320 Aphasia following cerebral infarction: Secondary | ICD-10-CM

## 2016-11-25 DIAGNOSIS — G8110 Spastic hemiplegia affecting unspecified side: Secondary | ICD-10-CM

## 2016-11-25 DIAGNOSIS — I63412 Cerebral infarction due to embolism of left middle cerebral artery: Secondary | ICD-10-CM | POA: Diagnosis not present

## 2016-11-25 DIAGNOSIS — R4701 Aphasia: Secondary | ICD-10-CM

## 2016-11-25 NOTE — Patient Instructions (Signed)
Continue Coumadin  for stroke prevention  maintain strict control of hypertension with blood pressure goal below 140/90, today's reading 111/70 Continue range of motion exercises HEP  Important to get patient up every day has Harrel Lemon lift Continue supportive care Will discharge from stroke clinic

## 2016-11-27 DIAGNOSIS — I69351 Hemiplegia and hemiparesis following cerebral infarction affecting right dominant side: Secondary | ICD-10-CM | POA: Diagnosis not present

## 2016-11-27 DIAGNOSIS — Z7901 Long term (current) use of anticoagulants: Secondary | ICD-10-CM | POA: Diagnosis not present

## 2016-11-27 DIAGNOSIS — I890 Lymphedema, not elsewhere classified: Secondary | ICD-10-CM | POA: Diagnosis not present

## 2016-11-27 DIAGNOSIS — M25561 Pain in right knee: Secondary | ICD-10-CM | POA: Diagnosis not present

## 2016-11-27 DIAGNOSIS — I69320 Aphasia following cerebral infarction: Secondary | ICD-10-CM | POA: Diagnosis not present

## 2016-11-30 ENCOUNTER — Telehealth: Payer: Self-pay | Admitting: Internal Medicine

## 2016-11-30 MED ORDER — GABAPENTIN 100 MG PO CAPS: 200.0000 mg | ORAL_CAPSULE | Freq: Two times a day (BID) | ORAL | 3 refills | Status: DC

## 2016-11-30 NOTE — Telephone Encounter (Signed)
Verbal Orders for Home health PT  1x1 2x2  [id number] - Lelan Pons  OK to LVM

## 2016-11-30 NOTE — Telephone Encounter (Signed)
Reviewed chart pt saw Sandy Barrett on 11/5, and she did advise pt top increase gabapentin 2 capsules twice a day. Updated script notified pt daughter [facility] rx [facility].../lmb

## 2016-11-30 NOTE — Telephone Encounter (Signed)
Pt's daughter called stating that the pt is needing a refill on her gabapentin (NEURONTIN) 100 MG capsule. The last time she was seen by Baldo Ash her dose was increased to taking 2 twice a day. They did not know if she should continue this dose or if it would need to be changed again. Please advise. ([facility] [address])

## 2016-11-30 NOTE — Telephone Encounter (Signed)
Spoke with Sandy Barrett to give verbal orders per MD.  

## 2016-12-01 ENCOUNTER — Ambulatory Visit (INDEPENDENT_AMBULATORY_CARE_PROVIDER_SITE_OTHER): Payer: Medicare Other | Admitting: General Practice

## 2016-12-01 ENCOUNTER — Other Ambulatory Visit: Payer: Self-pay | Admitting: Internal Medicine

## 2016-12-01 DIAGNOSIS — Z7901 Long term (current) use of anticoagulants: Secondary | ICD-10-CM

## 2016-12-01 LAB — POCT INR: INR: 1.6

## 2016-12-01 NOTE — Telephone Encounter (Signed)
[place] Controlled Substance Database checked. Last filled on 11/01/16

## 2016-12-01 NOTE — Patient Instructions (Addendum)
Pre visit review using our clinic review tool, if applicable. No additional management support is needed unless otherwise documented below in the visit note.  Take 4 mg today and then change dosage and take 2 mg daily except 4 mg on Monday and Fridays. Re-check in 2 weeks.

## 2016-12-02 DIAGNOSIS — Z7901 Long term (current) use of anticoagulants: Secondary | ICD-10-CM | POA: Diagnosis not present

## 2016-12-02 DIAGNOSIS — I69320 Aphasia following cerebral infarction: Secondary | ICD-10-CM | POA: Diagnosis not present

## 2016-12-02 DIAGNOSIS — M25561 Pain in right knee: Secondary | ICD-10-CM | POA: Diagnosis not present

## 2016-12-02 DIAGNOSIS — I890 Lymphedema, not elsewhere classified: Secondary | ICD-10-CM | POA: Diagnosis not present

## 2016-12-02 DIAGNOSIS — I69351 Hemiplegia and hemiparesis following cerebral infarction affecting right dominant side: Secondary | ICD-10-CM | POA: Diagnosis not present

## 2016-12-03 ENCOUNTER — Ambulatory Visit: Payer: No Typology Code available for payment source | Admitting: Physical Therapy

## 2016-12-04 DIAGNOSIS — Z7901 Long term (current) use of anticoagulants: Secondary | ICD-10-CM | POA: Diagnosis not present

## 2016-12-04 DIAGNOSIS — I69351 Hemiplegia and hemiparesis following cerebral infarction affecting right dominant side: Secondary | ICD-10-CM | POA: Diagnosis not present

## 2016-12-04 DIAGNOSIS — I890 Lymphedema, not elsewhere classified: Secondary | ICD-10-CM | POA: Diagnosis not present

## 2016-12-04 DIAGNOSIS — I69320 Aphasia following cerebral infarction: Secondary | ICD-10-CM | POA: Diagnosis not present

## 2016-12-04 DIAGNOSIS — M25561 Pain in right knee: Secondary | ICD-10-CM | POA: Diagnosis not present

## 2016-12-05 NOTE — Progress Notes (Signed)
Agree.  Stacy J Burns, MD  

## 2016-12-07 DIAGNOSIS — I890 Lymphedema, not elsewhere classified: Secondary | ICD-10-CM | POA: Diagnosis not present

## 2016-12-07 DIAGNOSIS — I69320 Aphasia following cerebral infarction: Secondary | ICD-10-CM | POA: Diagnosis not present

## 2016-12-07 DIAGNOSIS — I69351 Hemiplegia and hemiparesis following cerebral infarction affecting right dominant side: Secondary | ICD-10-CM | POA: Diagnosis not present

## 2016-12-07 DIAGNOSIS — Z7901 Long term (current) use of anticoagulants: Secondary | ICD-10-CM | POA: Diagnosis not present

## 2016-12-07 DIAGNOSIS — M25561 Pain in right knee: Secondary | ICD-10-CM | POA: Diagnosis not present

## 2016-12-08 NOTE — Telephone Encounter (Signed)
ERROR

## 2016-12-09 DIAGNOSIS — I69351 Hemiplegia and hemiparesis following cerebral infarction affecting right dominant side: Secondary | ICD-10-CM | POA: Diagnosis not present

## 2016-12-09 DIAGNOSIS — I890 Lymphedema, not elsewhere classified: Secondary | ICD-10-CM | POA: Diagnosis not present

## 2016-12-09 DIAGNOSIS — M25561 Pain in right knee: Secondary | ICD-10-CM | POA: Diagnosis not present

## 2016-12-09 DIAGNOSIS — I69320 Aphasia following cerebral infarction: Secondary | ICD-10-CM | POA: Diagnosis not present

## 2016-12-09 DIAGNOSIS — Z7901 Long term (current) use of anticoagulants: Secondary | ICD-10-CM | POA: Diagnosis not present

## 2016-12-11 ENCOUNTER — Ambulatory Visit: Payer: Medicare Other

## 2016-12-11 DIAGNOSIS — I69320 Aphasia following cerebral infarction: Secondary | ICD-10-CM | POA: Diagnosis not present

## 2016-12-11 DIAGNOSIS — Z7901 Long term (current) use of anticoagulants: Secondary | ICD-10-CM | POA: Diagnosis not present

## 2016-12-11 DIAGNOSIS — I69351 Hemiplegia and hemiparesis following cerebral infarction affecting right dominant side: Secondary | ICD-10-CM | POA: Diagnosis not present

## 2016-12-11 DIAGNOSIS — I890 Lymphedema, not elsewhere classified: Secondary | ICD-10-CM | POA: Diagnosis not present

## 2016-12-11 DIAGNOSIS — M25561 Pain in right knee: Secondary | ICD-10-CM | POA: Diagnosis not present

## 2016-12-14 ENCOUNTER — Other Ambulatory Visit: Payer: Self-pay | Admitting: Endocrinology

## 2016-12-15 ENCOUNTER — Ambulatory Visit: Payer: Medicare Other | Admitting: Internal Medicine

## 2016-12-15 DIAGNOSIS — M25561 Pain in right knee: Secondary | ICD-10-CM | POA: Diagnosis not present

## 2016-12-15 DIAGNOSIS — Z7901 Long term (current) use of anticoagulants: Secondary | ICD-10-CM | POA: Diagnosis not present

## 2016-12-15 DIAGNOSIS — I69351 Hemiplegia and hemiparesis following cerebral infarction affecting right dominant side: Secondary | ICD-10-CM | POA: Diagnosis not present

## 2016-12-15 DIAGNOSIS — I69320 Aphasia following cerebral infarction: Secondary | ICD-10-CM | POA: Diagnosis not present

## 2016-12-15 DIAGNOSIS — I890 Lymphedema, not elsewhere classified: Secondary | ICD-10-CM | POA: Diagnosis not present

## 2016-12-15 NOTE — Progress Notes (Deleted)
Subjective:    Patient ID: Sandy Barrett, female    DOB: August 13, 1932, 83 y.o.   MRN: [id number]  HPI She is here for an acute visit.   Possible cyst on right side:      Medications and allergies reviewed with patient and updated if appropriate.  Patient Active Problem List   Diagnosis Date Noted  . Upper respiratory tract infection 10/28/2016  . Abnormal urine odor 10/28/2016  . Venous congestion 10/08/2016  . Lymphedema of breast 09/11/2016  . Long term (current) use of anticoagulants [Z79.01] 09/10/2016  . Cough 08/23/2016  . Left hip pain 08/09/2016  . Yeast infection of the skin 07/01/2016  . Hematoma of abdominal wall, sequela 06/06/2016  . Abnormal stool color 05/18/2016  . Left leg swelling 05/18/2016  . Heat rash 05/04/2016  . Encounter for hospice care discussion   . Pressure injury of skin 04/27/2016  . Aspiration pneumonia ([place]) 04/20/2016  . Hemidiaphragm paralysis   . Wheelchair bound 02/03/2016  . Conjunctival hemorrhage of right eye 01/28/2016  . Ecchymosis 01/28/2016  . Vaginal bleeding 01/28/2016  . Gross hematuria 01/28/2016  . Discoloration of skin 09/26/2015  . Dermatitis 09/13/2015  . Essential thrombocytosis ([place]) 07/31/2015  . SVC (superior vena cava obstruction), chronic 07/20/2015  . Dysphagia 07/20/2015  . Elevated liver enzymes 07/20/2015  . Constipation 07/20/2015  . Thrombophlebitis of breast, right 06/18/2015  . Tracheal deviation 06/06/2015  . Sensorineural hearing loss, bilateral, moderate-moderately severe 04/30/2015  . Abdominal wall lump 03/09/2015  . Frequent UTI 02/06/2015  . Primary osteoarthritis involving multiple joints 07/26/2014  . Pernicious anemia 07/26/2014  . Hearing loss 07/26/2014  . Encounter for therapeutic drug monitoring 07/17/2014  . Spastic hemiparesis affecting dominant side ([place]), right 05/31/2014  . DVT of lower extremity, bilateral ([place]) 03/14/2014  . Global aphasia 03/14/2014  . 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 Aphasia S/P CVA 03/13/2014  . Cerebral infarction due to embolism of left middle cerebral artery ([place])   . Stroke, embolic ([place]) 68/34/1962  . History of pulmonary embolism 03/08/2014  . Back pain 08/29/2013  . Primary localized osteoarthrosis, lower leg 03/06/2013  . IBS (irritable bowel syndrome)   . Multinodular goiter 01/13/2011  . Hyperthyroidism 11/11/2009  . Insomnia 07/03/2009  . POSTTRAUMATIC STRESS DISORDER 08/22/2008  . OBESITY 04/21/2008  . Osteoarthritis 04/21/2008  . MIGRAINE HEADACHE 04/20/2008  . Essential hypertension 04/20/2008    Current Outpatient Medications on File Prior to Visit  Medication Sig Dispense Refill  . acetaminophen (TYLENOL) 160 MG/5ML liquid Take 480 mg by mouth every 4 (four) hours as needed for fever or pain.    [name] albuterol (PROVENTIL HFA) 108 (90 Base) MCG/ACT inhaler Inhale 2 puffs into the lungs every 6 (six) hours as needed for wheezing or shortness of breath. 1 Inhaler 1  . calamine lotion Apply 1 application as needed topically for itching. 120 mL 0  . diclofenac sodium (VOLTAREN) 1 % GEL APPLY (2GMS) TOPICALLY THREE TIMES DAILY. 300 g 5  . gabapentin (NEURONTIN) 100 MG capsule Take 2 capsules (200 mg total) by mouth 2 (two) times daily. 120 capsule 3  . hydroxyurea (HYDREA) 500 MG capsule Take 1 tablet (500 mg) Monday through Friday, take 2 tablets ( 1000 mg ) Saturday and Sunday 90 capsule 3  . ipratropium-albuterol (DUONEB) 0.5-2.5 (3) MG/3ML SOLN Take 3 mLs by nebulization every 6 (six) hours as needed. (Patient taking differently: Take 3 mLs by nebulization every 6 (six) hours as needed (shortness of  breath). ) 360 mL 0  . lidocaine (LIDODERM) 5 % Place 1 patch daily onto the skin. Remove & Discard patch within 12 hours or as directed by MD 15 patch 0  . methimazole (TAPAZOLE) 5 MG tablet TAKE 1 TABLET BY MOUTH TWICE WEEKLY 12 tablet 1  . nystatin (MYCOSTATIN/NYSTOP) powder Apply topically 3 (three) times daily. 30 g 0   . polyethylene glycol (MIRALAX / GLYCOLAX) packet TAKE 17G BY MOUTH TWICE A DAY (Patient taking differently: TAKE 17G BY MOUTH TWICE A DAY AS NEED FOR CONSTIPATION) 14 packet 1  . Probiotic Product (ALIGN) 4 MG CAPS Take 1 capsule by mouth daily.     . propranolol (INDERAL) 10 MG tablet Take 0.5 tablets (5 mg total) by mouth 2 (two) times daily. 60 tablet 5  . PROTONIX 40 MG PACK TAKE 20 MLS (40 MG TOTAL) BY MOUTH DAILY. 30 each 5  . terconazole (TERAZOL 7) 0.4 % vaginal cream Place 1 applicator vaginally at bedtime. (Patient taking differently: Place 1 applicator vaginally at bedtime as needed (irritation). ) 90 g 3  . traMADol (ULTRAM) 50 MG tablet TAKE 2 TABLETS BY MOUTH 3 TIMES A DAY AS NEEDED 180 tablet 1  . traZODone (DESYREL) 50 MG tablet TAKE 1 TABLET (50 MG TOTAL) BY MOUTH AT BEDTIME AS NEEDED. FOR SLEEP 30 tablet 5  . triamcinolone ointment (KENALOG) 0.5 % Apply 1 application topically 2 (two) times daily. 30 g 0  . warfarin (COUMADIN) 2 MG tablet Take 2 tablets (4 mg total) by mouth one time only at 6 PM. 60 tablet 1   No current facility-administered medications on file prior to visit.     Past Medical History:  Diagnosis Date  . ARTHRITIS   . Arthritis   . Diverticulosis   . HYPERTENSION   . HYPERTHYROIDISM   . Hyperthyroidism    s/p I-131 ablation 03/2011 of multinod goiter  . INSOMNIA   . MIGRAINE HEADACHE   . OBESITY   . Posttraumatic stress disorder   . Pulmonary embolism ([place]) 03/2014   with DVT  . Stroke [facility]) 03/2014   dysarthria    Past Surgical History:  Procedure Laterality Date  . ABDOMINAL HYSTERECTOMY  1976  . BREAST SURGERY     biopsy  . RADIOLOGY WITH ANESTHESIA Left 03/08/2014   Procedure: RADIOLOGY WITH ANESTHESIA;  Surgeon: Rob Hickman, MD;  Location: [facility];  Service: Radiology;  Laterality: Left;  . TONSILLECTOMY    . TOTAL HIP ARTHROPLASTY  1998    right    Social History   Socioeconomic History  . Marital status: Widowed    Spouse  name: Not on file  . Number of children: 2  . Years of education: 32  . Highest education level: Not on file  Social Needs  . Financial resource strain: Not on file  . Food insecurity - worry: Not on file  . Food insecurity - inability: Not on file  . Transportation needs - medical: Not on file  . Transportation needs - non-medical: Not on file  Occupational History  . Occupation: retired    Comment: Restaurant manager, fast food  Tobacco Use  . Smoking status: Never Smoker  . Smokeless tobacco: Never Used  Substance and Sexual Activity  . Alcohol use: No    Alcohol/week: 0.0 oz  . Drug use: No  . Sexual activity: No  Other Topics Concern  . Not on file  Social History Narrative   Widowed, lived alone prior to CVA 03/2014  SNF at [facility], then home 07/2014 with dtr   Right handed   Caffeine use- occasionally drinks tea    Family History  Problem Relation Age of Onset  . Asthma Mother   . Asthma Father   . Prostate cancer Father   . Stroke Brother   . Breast cancer Sister   . Stomach cancer Sister   . Thyroid disease Neg Hx     Review of Systems     Objective:  There were no vitals filed for this visit. Wt Readings from Last 3 Encounters:  11/25/16 180 lb (81.6 kg)  11/17/16 180 lb (81.6 kg)  10/30/16 176 lb (79.8 kg)   There is no height or weight on file to calculate BMI.   Physical Exam         Assessment & Plan:    See Problem List for Assessment and Plan of chronic medical problems.

## 2016-12-17 ENCOUNTER — Telehealth: Payer: Self-pay | Admitting: Emergency Medicine

## 2016-12-17 DIAGNOSIS — M25561 Pain in right knee: Secondary | ICD-10-CM | POA: Diagnosis not present

## 2016-12-17 DIAGNOSIS — I69320 Aphasia following cerebral infarction: Secondary | ICD-10-CM | POA: Diagnosis not present

## 2016-12-17 DIAGNOSIS — I69351 Hemiplegia and hemiparesis following cerebral infarction affecting right dominant side: Secondary | ICD-10-CM | POA: Diagnosis not present

## 2016-12-17 DIAGNOSIS — I890 Lymphedema, not elsewhere classified: Secondary | ICD-10-CM | POA: Diagnosis not present

## 2016-12-17 DIAGNOSIS — Z7901 Long term (current) use of anticoagulants: Secondary | ICD-10-CM | POA: Diagnosis not present

## 2016-12-17 MED ORDER — CIPROFLOXACIN HCL 250 MG PO TABS: 250.0000 mg | ORAL_TABLET | Freq: Two times a day (BID) | ORAL | 0 refills | Status: DC

## 2016-12-17 NOTE — Telephone Encounter (Signed)
Spoke with pts daughter to inform RX was sent to [facility].  Spoke with Verdis Frederickson to give verbal orders to extend PT per MD

## 2016-12-17 NOTE — Telephone Encounter (Signed)
[place] for verbal orders extending PT.   Will just treat for possible UTI given difficulty getting urine sample.  Will try cipro - sent to [facility].

## 2016-12-17 NOTE — Telephone Encounter (Addendum)
Spoke with pts daughter, states she believes mother may have another UTI. She would like to know if an antibiotic can be sent in or does a specimen need to be collected by home health? Daughter also asked that the antibiotic be in pill form bc she believes the liquid caused diarrhea.   Also needing Verbal orders to extend PT, will call Verdis Frederickson at [phone] to give verbal orders

## 2016-12-18 ENCOUNTER — Ambulatory Visit: Payer: Medicare Other

## 2016-12-18 ENCOUNTER — Ambulatory Visit: Payer: Medicare Other | Admitting: Internal Medicine

## 2016-12-21 ENCOUNTER — Other Ambulatory Visit: Payer: Self-pay | Admitting: Internal Medicine

## 2016-12-21 DIAGNOSIS — I890 Lymphedema, not elsewhere classified: Secondary | ICD-10-CM | POA: Diagnosis not present

## 2016-12-21 DIAGNOSIS — M25561 Pain in right knee: Secondary | ICD-10-CM | POA: Diagnosis not present

## 2016-12-21 DIAGNOSIS — I69351 Hemiplegia and hemiparesis following cerebral infarction affecting right dominant side: Secondary | ICD-10-CM | POA: Diagnosis not present

## 2016-12-21 DIAGNOSIS — Z7901 Long term (current) use of anticoagulants: Secondary | ICD-10-CM | POA: Diagnosis not present

## 2016-12-21 DIAGNOSIS — I69320 Aphasia following cerebral infarction: Secondary | ICD-10-CM | POA: Diagnosis not present

## 2016-12-22 ENCOUNTER — Ambulatory Visit (INDEPENDENT_AMBULATORY_CARE_PROVIDER_SITE_OTHER): Payer: Medicare Other | Admitting: General Practice

## 2016-12-22 DIAGNOSIS — Z7901 Long term (current) use of anticoagulants: Secondary | ICD-10-CM | POA: Diagnosis not present

## 2016-12-22 LAB — POCT INR: INR: 2.6

## 2016-12-22 NOTE — Progress Notes (Signed)
Agree.  Stacy J Burns, MD  

## 2016-12-22 NOTE — Patient Instructions (Addendum)
Pre visit review using our clinic review tool, if applicable. No additional management support is needed unless otherwise documented below in the visit note.  Continue to take 2 mg daily except 4 mg on Monday and Fridays. Re-check in 4 weeks.

## 2016-12-24 DIAGNOSIS — I890 Lymphedema, not elsewhere classified: Secondary | ICD-10-CM | POA: Diagnosis not present

## 2016-12-24 DIAGNOSIS — I69320 Aphasia following cerebral infarction: Secondary | ICD-10-CM | POA: Diagnosis not present

## 2016-12-24 DIAGNOSIS — I69351 Hemiplegia and hemiparesis following cerebral infarction affecting right dominant side: Secondary | ICD-10-CM | POA: Diagnosis not present

## 2016-12-24 DIAGNOSIS — M25561 Pain in right knee: Secondary | ICD-10-CM | POA: Diagnosis not present

## 2016-12-24 DIAGNOSIS — Z7901 Long term (current) use of anticoagulants: Secondary | ICD-10-CM | POA: Diagnosis not present

## 2016-12-25 ENCOUNTER — Other Ambulatory Visit: Payer: Self-pay | Admitting: Emergency Medicine

## 2016-12-25 MED ORDER — FLUCONAZOLE 150 MG PO TABS: 150.0000 mg | ORAL_TABLET | Freq: Once | ORAL | 0 refills | Status: AC

## 2016-12-25 NOTE — Telephone Encounter (Signed)
Spoke with pts daughter, rx sent to pharmacy

## 2016-12-25 NOTE — Telephone Encounter (Signed)
?   Possible yeast infection - if mild red, itchy or swollen - we can try diflucan.  If agrees - rx pending.

## 2016-12-25 NOTE — Telephone Encounter (Signed)
Pt daughter called to advise that pt completed cipro on Tues. She is currently having vaginal irritation. Would like to know if she still needs to give the cipro time or if this could be another issue.

## 2016-12-28 DIAGNOSIS — M25561 Pain in right knee: Secondary | ICD-10-CM | POA: Diagnosis not present

## 2016-12-28 DIAGNOSIS — I69351 Hemiplegia and hemiparesis following cerebral infarction affecting right dominant side: Secondary | ICD-10-CM | POA: Diagnosis not present

## 2016-12-28 DIAGNOSIS — I69320 Aphasia following cerebral infarction: Secondary | ICD-10-CM | POA: Diagnosis not present

## 2016-12-28 DIAGNOSIS — I890 Lymphedema, not elsewhere classified: Secondary | ICD-10-CM | POA: Diagnosis not present

## 2016-12-28 DIAGNOSIS — Z7901 Long term (current) use of anticoagulants: Secondary | ICD-10-CM | POA: Diagnosis not present

## 2017-01-01 ENCOUNTER — Ambulatory Visit: Payer: Medicare Other | Admitting: Internal Medicine

## 2017-01-01 ENCOUNTER — Ambulatory Visit: Payer: Medicare Other | Admitting: Physical Medicine & Rehabilitation

## 2017-01-01 DIAGNOSIS — M25561 Pain in right knee: Secondary | ICD-10-CM | POA: Diagnosis not present

## 2017-01-01 DIAGNOSIS — I890 Lymphedema, not elsewhere classified: Secondary | ICD-10-CM | POA: Diagnosis not present

## 2017-01-01 DIAGNOSIS — Z7901 Long term (current) use of anticoagulants: Secondary | ICD-10-CM | POA: Diagnosis not present

## 2017-01-01 DIAGNOSIS — I69320 Aphasia following cerebral infarction: Secondary | ICD-10-CM | POA: Diagnosis not present

## 2017-01-01 DIAGNOSIS — I69351 Hemiplegia and hemiparesis following cerebral infarction affecting right dominant side: Secondary | ICD-10-CM | POA: Diagnosis not present

## 2017-01-04 ENCOUNTER — Ambulatory Visit: Payer: Medicare Other | Admitting: Podiatry

## 2017-01-06 ENCOUNTER — Telehealth: Payer: Self-pay | Admitting: Emergency Medicine

## 2017-01-06 NOTE — Telephone Encounter (Signed)
She was just on an antibiotic mid December and we need to be careful how many antibiotics she is getting.  Ideally she should be seen

## 2017-01-06 NOTE — Telephone Encounter (Signed)
Received call from pts daughter. States pt has a cough and congestion and also thinks she has a UTI. Would like to know if an antibiotic can be sent to cover both or if she needs to be seen.

## 2017-01-07 ENCOUNTER — Telehealth: Payer: Self-pay | Admitting: Internal Medicine

## 2017-01-07 DIAGNOSIS — I890 Lymphedema, not elsewhere classified: Secondary | ICD-10-CM | POA: Diagnosis not present

## 2017-01-07 DIAGNOSIS — Z7901 Long term (current) use of anticoagulants: Secondary | ICD-10-CM | POA: Diagnosis not present

## 2017-01-07 DIAGNOSIS — I69351 Hemiplegia and hemiparesis following cerebral infarction affecting right dominant side: Secondary | ICD-10-CM | POA: Diagnosis not present

## 2017-01-07 DIAGNOSIS — M25561 Pain in right knee: Secondary | ICD-10-CM | POA: Diagnosis not present

## 2017-01-07 DIAGNOSIS — I69320 Aphasia following cerebral infarction: Secondary | ICD-10-CM | POA: Diagnosis not present

## 2017-01-07 NOTE — Telephone Encounter (Signed)
Copied from [place]. Topic: Quick Communication - See Telephone Encounter >> Jan 07, 2017 11:50 AM Antonieta Iba C wrote: CRM for notification. See Telephone encounter for: Izora Gala OT w/ [facility] called in to request verbal orders to complete last in home visit with pt for discharge. She said that an apt was missed due to her being sick.   CB: [phone]  01/07/17.

## 2017-01-07 NOTE — Telephone Encounter (Signed)
LVM giving verbal order per MD for OT

## 2017-01-07 NOTE — Progress Notes (Signed)
Subjective:    Patient ID: Sandy Barrett, female    DOB: 25-Jul-1932, 84 y.o.   MRN: [id number]  HPI She is here for an acute visit for cold symptoms and possible UTI.  Her daughter provides the history.  Cold symptoms:  Today she has not coughed.  Her daughter gave her a mucinex and a halls lollipop.  She has difficulty swallowing - like food is getting stuck - this is not new.  She is unsure if she has had a sore throat - she is eating ok.  At times she does feel her neck and she is unsure if that is due to discomfort or not.  Her daughter had strep.  No fever/chills.  Her daughter thinks the cough is better and may not need anything for it. No obvious SOB or wheeze.      ? UTI: she has a strong smell in urine and in her diaper.  She is unsure if she has pain with urination.  No obvious blood in the urine.  No obvious abdominal pain.    Lump in right back - within the lump there is a lump - it is soft and is not causing pain.     Scars from shingles are still present. A couple looked like they opened back up and she is just applying topical   Gabapentin - taking 2 tabs two times daily.  Her daughter was on this and it made her groggy and foggy.  She is now currently on lyrica and she wonders if her mom should be switched to lyrica.    Medications and allergies reviewed with patient and updated if appropriate.  Patient Active Problem List   Diagnosis Date Noted  . Upper respiratory tract infection 10/28/2016  . Abnormal urine odor 10/28/2016  . Venous congestion 10/08/2016  . Lymphedema of breast 09/11/2016  . Long term (current) use of anticoagulants [Z79.01] 09/10/2016  . Cough 08/23/2016  . Left hip pain 08/09/2016  . Yeast infection of the skin 07/01/2016  . Hematoma of abdominal wall, sequela 06/06/2016  . Abnormal stool color 05/18/2016  . Left leg swelling 05/18/2016  . Heat rash 05/04/2016  . Encounter for hospice care discussion   . Pressure injury of skin  04/27/2016  . Aspiration pneumonia ([place]) 04/20/2016  . Hemidiaphragm paralysis   . Wheelchair bound 02/03/2016  . Conjunctival hemorrhage of right eye 01/28/2016  . Ecchymosis 01/28/2016  . Vaginal bleeding 01/28/2016  . Gross hematuria 01/28/2016  . Discoloration of skin 09/26/2015  . Dermatitis 09/13/2015  . Essential thrombocytosis ([place]) 07/31/2015  . SVC (superior vena cava obstruction), chronic 07/20/2015  . Dysphagia 07/20/2015  . Elevated liver enzymes 07/20/2015  . Constipation 07/20/2015  . Thrombophlebitis of breast, right 06/18/2015  . Tracheal deviation 06/06/2015  . Sensorineural hearing loss, bilateral, moderate-moderately severe 04/30/2015  . Abdominal wall lump 03/09/2015  . Frequent UTI 02/06/2015  . Primary osteoarthritis involving multiple joints 07/26/2014  . Pernicious anemia 07/26/2014  . Hearing loss 07/26/2014  . Encounter for therapeutic drug monitoring 07/17/2014  . Spastic hemiparesis affecting dominant side ([place]), right 05/31/2014  . DVT of lower extremity, bilateral ([place]) 03/14/2014  . Global aphasia 03/14/2014  . 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 Aphasia S/P CVA 03/13/2014  . Cerebral infarction due to embolism of left middle cerebral artery ([place])   . Stroke, embolic ([place]) 76/28/3151  . History of pulmonary embolism 03/08/2014  . Back pain 08/29/2013  . Primary localized osteoarthrosis, lower leg 03/06/2013  .  IBS (irritable bowel syndrome)   . Multinodular goiter 01/13/2011  . Hyperthyroidism 11/11/2009  . Insomnia 07/03/2009  . POSTTRAUMATIC STRESS DISORDER 08/22/2008  . OBESITY 04/21/2008  . Osteoarthritis 04/21/2008  . MIGRAINE HEADACHE 04/20/2008  . Essential hypertension 04/20/2008    Current Outpatient Medications on File Prior to Visit  Medication Sig Dispense Refill  . acetaminophen (TYLENOL) 160 MG/5ML liquid Take 480 mg by mouth every 4 (four) hours as needed for fever or pain.    [name] albuterol (PROVENTIL HFA) 108 (90 Base)  MCG/ACT inhaler Inhale 2 puffs into the lungs every 6 (six) hours as needed for wheezing or shortness of breath. 1 Inhaler 1  . calamine lotion Apply 1 application as needed topically for itching. 120 mL 0  . diclofenac sodium (VOLTAREN) 1 % GEL APPLY (2GMS) TOPICALLY THREE TIMES DAILY. 300 g 5  . hydroxyurea (HYDREA) 500 MG capsule Take 1 tablet (500 mg) Monday through Friday, take 2 tablets ( 1000 mg ) Saturday and Sunday 90 capsule 3  . ipratropium-albuterol (DUONEB) 0.5-2.5 (3) MG/3ML SOLN Take 3 mLs by nebulization every 6 (six) hours as needed. (Patient taking differently: Take 3 mLs by nebulization every 6 (six) hours as needed (shortness of breath). ) 360 mL 0  . lidocaine (LIDODERM) 5 % Place 1 patch daily onto the skin. Remove & Discard patch within 12 hours or as directed by MD 15 patch 0  . methimazole (TAPAZOLE) 5 MG tablet TAKE 1 TABLET BY MOUTH TWICE WEEKLY 12 tablet 1  . nystatin (MYCOSTATIN/NYSTOP) powder Apply topically 3 (three) times daily. 30 g 0  . polyethylene glycol (MIRALAX / GLYCOLAX) packet TAKE 17G BY MOUTH TWICE A DAY (Patient taking differently: TAKE 17G BY MOUTH TWICE A DAY AS NEED FOR CONSTIPATION) 14 packet 1  . Probiotic Product (ALIGN) 4 MG CAPS Take 1 capsule by mouth daily.     . propranolol (INDERAL) 10 MG tablet Take 0.5 tablets (5 mg total) by mouth 2 (two) times daily. 60 tablet 5  . PROTONIX 40 MG PACK TAKE 20 MLS (40 MG TOTAL) BY MOUTH DAILY. 30 each 5  . terconazole (TERAZOL 7) 0.4 % vaginal cream Place 1 applicator vaginally at bedtime. (Patient taking differently: Place 1 applicator vaginally at bedtime as needed (irritation). ) 90 g 3  . traMADol (ULTRAM) 50 MG tablet TAKE 2 TABLETS BY MOUTH 3 TIMES A DAY AS NEEDED 180 tablet 1  . traZODone (DESYREL) 50 MG tablet TAKE 1 TABLET (50 MG TOTAL) BY MOUTH AT BEDTIME AS NEEDED. FOR SLEEP 30 tablet 5  . triamcinolone ointment (KENALOG) 0.5 % Apply 1 application topically 2 (two) times daily. 30 g 0  . warfarin  (COUMADIN) 2 MG tablet Take 2 tablets (4 mg total) by mouth one time only at 6 PM. 60 tablet 1   No current facility-administered medications on file prior to visit.     Past Medical History:  Diagnosis Date  . ARTHRITIS   . Arthritis   . Diverticulosis   . HYPERTENSION   . HYPERTHYROIDISM   . Hyperthyroidism    s/p I-131 ablation 03/2011 of multinod goiter  . INSOMNIA   . MIGRAINE HEADACHE   . OBESITY   . Posttraumatic stress disorder   . Pulmonary embolism ([place]) 03/2014   with DVT  . Stroke [facility]) 03/2014   dysarthria    Past Surgical History:  Procedure Laterality Date  . ABDOMINAL HYSTERECTOMY  1976  . BREAST SURGERY     biopsy  . RADIOLOGY  WITH ANESTHESIA Left 03/08/2014   Procedure: RADIOLOGY WITH ANESTHESIA;  Surgeon: Rob Hickman, MD;  Location: [facility];  Service: Radiology;  Laterality: Left;  . TONSILLECTOMY    . TOTAL HIP ARTHROPLASTY  1998    right    Social History   Socioeconomic History  . Marital status: Widowed    Spouse name: Not on file  . Number of children: 2  . Years of education: 74  . Highest education level: Not on file  Social Needs  . Financial resource strain: Not on file  . Food insecurity - worry: Not on file  . Food insecurity - inability: Not on file  . Transportation needs - medical: Not on file  . Transportation needs - non-medical: Not on file  Occupational History  . Occupation: retired    Comment: Restaurant manager, fast food  Tobacco Use  . Smoking status: Never Smoker  . Smokeless tobacco: Never Used  Substance and Sexual Activity  . Alcohol use: No    Alcohol/week: 0.0 oz  . Drug use: No  . Sexual activity: No  Other Topics Concern  . Not on file  Social History Narrative   Widowed, lived alone prior to CVA 03/2014   SNF at [facility], then home 07/2014 with dtr   Right handed   Caffeine use- occasionally drinks tea    Family History  Problem Relation Age of Onset  . Asthma Mother   . Asthma Father   . Prostate  cancer Father   . Stroke Brother   . Breast cancer Sister   . Stomach cancer Sister   . Thyroid disease Neg Hx     Review of Systems Obtained from daughter, see HPI    Objective:   Vitals:   01/08/17 1541  BP: 116/80  Pulse: 81  Resp: 16  Temp: 97.7 F (36.5 C)   Filed Weights   There is no height or weight on file to calculate BMI.  Wt Readings from Last 3 Encounters:  11/25/16 180 lb (81.6 kg)  11/17/16 180 lb (81.6 kg)  10/30/16 176 lb (79.8 kg)     Physical Exam GENERAL APPEARANCE: Appears stated age, well appearing, NAD EYES: conjunctiva clear, no icterus HEENT: bilateral tympanic membranes and ear canals normal, oropharynx with no erythema, no thyromegaly, trachea midline, no cervical or supraclavicular lymphadenopathy LUNGS: Clear to auscultation without wheeze or crackles, unlabored breathing, good air entry bilaterally CARDIOVASCULAR: Normal S1,S2 without murmurs,  LLE edema - wearing compression wrap; mild RLE edema ABDOMEN:  Soft, non tender, non distended SKIN: warm, dry        Assessment & Plan:   See Problem List for Assessment and Plan of chronic medical problems.

## 2017-01-07 NOTE — Telephone Encounter (Signed)
Spoke with pts daughter, appt scheduled for Friday. They must have a 24 hr notice for transportation.

## 2017-01-08 ENCOUNTER — Ambulatory Visit (INDEPENDENT_AMBULATORY_CARE_PROVIDER_SITE_OTHER): Payer: Medicare Other | Admitting: Internal Medicine

## 2017-01-08 VITALS — BP 116/80 | HR 81 | Temp 97.7°F | Resp 16

## 2017-01-08 DIAGNOSIS — R05 Cough: Secondary | ICD-10-CM | POA: Diagnosis not present

## 2017-01-08 DIAGNOSIS — I890 Lymphedema, not elsewhere classified: Secondary | ICD-10-CM

## 2017-01-08 DIAGNOSIS — N390 Urinary tract infection, site not specified: Secondary | ICD-10-CM

## 2017-01-08 DIAGNOSIS — R059 Cough, unspecified: Secondary | ICD-10-CM

## 2017-01-08 DIAGNOSIS — M792 Neuralgia and neuritis, unspecified: Secondary | ICD-10-CM

## 2017-01-08 MED ORDER — PREGABALIN 25 MG PO CAPS: 25.0000 mg | ORAL_CAPSULE | Freq: Two times a day (BID) | ORAL | 3 refills | Status: DC

## 2017-01-08 MED ORDER — FLUCONAZOLE 150 MG PO TABS: 150.0000 mg | ORAL_TABLET | Freq: Once | ORAL | 0 refills | Status: DC

## 2017-01-08 MED ORDER — AMOXICILLIN-POT CLAVULANATE 875-125 MG PO TABS: 1.0000 | ORAL_TABLET | Freq: Two times a day (BID) | ORAL | 0 refills | Status: DC

## 2017-01-08 NOTE — Patient Instructions (Signed)
Medications reviewed and updated.  Changes include starting augmentin for the UTI.  Diflucan was sent in case she needs it.  Stop the gabapentin and start the lyrica.    Your prescription(s) have been submitted to your pharmacy. Please take as directed and contact our office if you believe you are having problem(s) with the medication(s).

## 2017-01-09 ENCOUNTER — Encounter: Payer: Self-pay | Admitting: Internal Medicine

## 2017-01-09 DIAGNOSIS — M792 Neuralgia and neuritis, unspecified: Secondary | ICD-10-CM | POA: Insufficient documentation

## 2017-01-09 NOTE — Assessment & Plan Note (Signed)
Only started a couple of days ago Better today after mucinex ? Viral or possible dysphagia/aspiration Lungs are clear, appears well Will be starting an antibiotic for probable UTI - will pick one that will treat both - augmentin

## 2017-01-09 NOTE — Assessment & Plan Note (Signed)
Urine with foul odor - it is very difficult to get a urine sample so we will empirically treat for a UTI Start augmentin Call if symptoms do not improve

## 2017-01-09 NOTE — Assessment & Plan Note (Signed)
Related to stroke, shingles Taking gabapentin - her daughter wants to try her on lyrica  - will d/c gabapentin and start lyrica 25 mg BID - advised to monitor for altered mental status, worsening of edema

## 2017-01-09 NOTE — Assessment & Plan Note (Signed)
Needs mastectomy supplies - will send rx to [facility] - compression bra -- mastectomy products - for lymphedema in right breast.    Fax - [phone]

## 2017-01-11 ENCOUNTER — Ambulatory Visit (INDEPENDENT_AMBULATORY_CARE_PROVIDER_SITE_OTHER): Payer: Medicare Other | Admitting: Internal Medicine

## 2017-01-11 ENCOUNTER — Encounter: Payer: Self-pay | Admitting: Internal Medicine

## 2017-01-11 VITALS — HR 88 | Ht 60.0 in

## 2017-01-11 DIAGNOSIS — E042 Nontoxic multinodular goiter: Secondary | ICD-10-CM

## 2017-01-11 DIAGNOSIS — J398 Other specified diseases of upper respiratory tract: Secondary | ICD-10-CM | POA: Diagnosis not present

## 2017-01-11 LAB — T3, FREE: T3, Free: 2.7 pg/mL (ref 2.3–4.2)

## 2017-01-11 LAB — T4, FREE: Free T4: 0.99 ng/dL (ref 0.60–1.60)

## 2017-01-11 LAB — TSH: TSH: 1.78 u[IU]/mL (ref 0.35–4.50)

## 2017-01-11 NOTE — Patient Instructions (Signed)
Please stop at the lab.  Stop Methimazole 5 days prior to the RAI treatment.  Come back for labs 1 month after the treatment.  Please return in 3 months with your sugar log.

## 2017-01-11 NOTE — Progress Notes (Signed)
Patient ID: Sandy Barrett, female   DOB: [birth year], 84 y.o.   MRN: [id number]    HPI  Sandy Barrett is an 84 y.o.-year-old female, initially referred by her PCP, Dr. Quay Burow, returning for f/u for compressive multinodular goiter. She is here with her daughter who offers all of the hx as patient has expression aphasia after her stroke in 2016.  Last visit 7 months ago.  Reviewed and addended history: Her daughter Sandy Barrett) describes that in 05/2016 >> pt started to have choking spells>> she ended up developing aspiration pneumonia in 05/2016.  She has a long history of toxic multinodular goiter, for which she had RAI treatment in 2013, also with the hope of reducing her goiter size.  However, she had to restart methimazole after her treatment, now on 5 mg twice a week.  Since last visit, she saw Dr. Harlow Asa (surgery) and Dr. Vicie Mutters (ENT) and they did not think that she is a good candidate for thyroid surgery.  Reviewed the reports of her imaging studies: Es Ba swallow (04/23/2016):  A large substernal goiter is seen causing marked displacement of the esophagus to the right. A smooth stricture of the upper esophagus is seen at level thoracic inlet, likely due to external compression by substernal goiter. This does not obstruct the passage of thin barium. No evidence of distal esophageal stricture or hiatal hernia. Lack of primary peristalsis resulting in esophageal stasis.  CT neck (04/21/2016): Pharynx and larynx: Normal pharyngeal structures. Severe rightward displacement of the proximal trachea and larynx due to the leftward dominant multinodular goiter. This may be increased from prior of June 2017. Thyroid: Markedly enlarged and heterogeneous thyroid, with multiple nodules and scattered calcifications, significant substernal extent. In addition to mass effect on the trachea, the SVC is displaced and effaced. Approximate measurements are 97 x 45 x 68 mm, although the entire  craniocaudal extent is not assessed on this CT neck study.  Swallowing study (04/21/2016): Ms. Mckeon presents with a mild oral/pharyngeal dysphagia, suspect primary esophageal dysphagia. Oral phase impairments included mild prolonged mastication with regular solids (pt wears partial dentures). Observed mild vallecular and pyriform sinus residue with thin liquids via cup/straw due to reduced laryngeal elevation and epiglottic inversion. MBS does not diagnose below the level of the UES however scan of esophagus revealed stasis within the mid-esophagus and questionable stenosis. Pt previously diagnosed with a substernal goiter causing tracheal narrowing and deviation to the right possible causing observations. Educated pt re: results of MBS, diet recommendations, esophageal precautions (alternate solids/liquids, sit upright after meals), and advised pt to consider GI consult due to esophageal findings.   Pt cannot offer info Re: - feeling nodules in neck - hoarseness - choking - SOB with lying down  But daughter again mentions dysphagia + choking with foods and even vomiting after eating occasionally. Unfortunately, pt cannot be weighed >> unclear if lost weight.  I reviewed pt's thyroid tests: Lab Results  Component Value Date   TSH 1.15 08/03/2016   TSH 1.55 04/01/2016   TSH 1.25 12/03/2015   TSH 2.39 08/02/2015   TSH 0.97 05/17/2015   TSH 0.79 04/05/2015   TSH 0.205 (L) 03/12/2015   TSH 0.25 (L) 08/21/2014   TSH 0.133 (L) 03/10/2014   TSH 0.70 08/07/2013   FREET4 1.13 08/03/2016   FREET4 1.04 04/01/2016   FREET4 0.94 12/03/2015   FREET4 1.54 08/02/2015   FREET4 1.07 05/17/2015   FREET4 1.10 04/05/2015   FREET4 1.33 08/31/2012   FREET4 1.09 06/02/2011  FREET4 1.24 10/28/2010   FREET4 1.12 07/22/2010    No FH of thyroid ds. No FH of thyroid cancer. No h/o radiation tx to head or neck except RAI tx.  No seaweed or Barrett. No recent contrast studies. No herbal supplements. No  Biotin use. No recent steroids use.   ROS - per daughter: Constitutional: no weight gain/no weight loss, no fatigue, no subjective hyperthermia, no subjective hypothermia Eyes: no blurry vision, no xerophthalmia ENT: no sore throat, no nodules palpated in throat, + dysphagia, no odynophagia, no hoarseness Cardiovascular: no CP/no SOB/no palpitations/+ leg swelling Respiratory: + cough/no SOB/no wheezing Gastrointestinal: no N/no V/no D/no C/no acid reflux Musculoskeletal: + muscle aches/+ joint aches Skin: no rashes, no hair loss Neurological: no tremors/no numbness/no tingling/no dizziness  I reviewed pt's medications, allergies, PMH, social hx, family hx, and changes were documented in the history of present illness. Otherwise, unchanged from my initial visit note. Stopped neurontin, started Lyrica.  Past Medical History:  Diagnosis Date  . ARTHRITIS   . Arthritis   . Diverticulosis   . HYPERTENSION   . HYPERTHYROIDISM   . Hyperthyroidism    s/p I-131 ablation 03/2011 of multinod goiter  . INSOMNIA   . MIGRAINE HEADACHE   . OBESITY   . Posttraumatic stress disorder   . Pulmonary embolism ([place]) 03/2014   with DVT  . Stroke [facility]) 03/2014   dysarthria   Past Surgical History:  Procedure Laterality Date  . ABDOMINAL HYSTERECTOMY  1976  . BREAST SURGERY     biopsy  . RADIOLOGY WITH ANESTHESIA Left 03/08/2014   Procedure: RADIOLOGY WITH ANESTHESIA;  Surgeon: Rob Hickman, MD;  Location: [place];  Service: Radiology;  Laterality: Left;  . TONSILLECTOMY    . TOTAL HIP ARTHROPLASTY  1998    right   Social History   Socioeconomic History  . Marital status: Widowed    Spouse name: Not on file  . Number of children: 2  . Years of education: 75  . Highest education level: Not on file  Social Needs  . Financial resource strain: Not on file  . Food insecurity - worry: Not on file  . Food insecurity - inability: Not on file  . Transportation needs - medical: Not on file  .  Transportation needs - non-medical: Not on file  Occupational History  . Occupation: retired    Comment: Restaurant manager, fast food  Tobacco Use  . Smoking status: Never Smoker  . Smokeless tobacco: Never Used  Substance and Sexual Activity  . Alcohol use: No    Alcohol/week: 0.0 oz  . Drug use: No  . Sexual activity: No  Other Topics Concern  . Not on file  Social History Narrative   Widowed, lived alone prior to CVA 03/2014   SNF at [facility], then home 07/2014 with dtr   Right handed   Caffeine use- occasionally drinks tea   Current Outpatient Medications on File Prior to Visit  Medication Sig Dispense Refill  . acetaminophen (TYLENOL) 160 MG/5ML liquid Take 480 mg by mouth every 4 (four) hours as needed for fever or pain.    [name] albuterol (PROVENTIL HFA) 108 (90 Base) MCG/ACT inhaler Inhale 2 puffs into the lungs every 6 (six) hours as needed for wheezing or shortness of breath. 1 Inhaler 1  . amoxicillin-clavulanate (AUGMENTIN) 875-125 MG tablet Take 1 tablet by mouth 2 (two) times daily. 20 tablet 0  . calamine lotion Apply 1 application as needed topically for itching. 120 mL  0  . diclofenac sodium (VOLTAREN) 1 % GEL APPLY (2GMS) TOPICALLY THREE TIMES DAILY. 300 g 5  . hydroxyurea (HYDREA) 500 MG capsule Take 1 tablet (500 mg) Monday through Friday, take 2 tablets ( 1000 mg ) Saturday and Sunday 90 capsule 3  . ipratropium-albuterol (DUONEB) 0.5-2.5 (3) MG/3ML SOLN Take 3 mLs by nebulization every 6 (six) hours as needed. (Patient taking differently: Take 3 mLs by nebulization every 6 (six) hours as needed (shortness of breath). ) 360 mL 0  . lidocaine (LIDODERM) 5 % Place 1 patch daily onto the skin. Remove & Discard patch within 12 hours or as directed by MD 15 patch 0  . methimazole (TAPAZOLE) 5 MG tablet TAKE 1 TABLET BY MOUTH TWICE WEEKLY 12 tablet 1  . nystatin (MYCOSTATIN/NYSTOP) powder Apply topically 3 (three) times daily. 30 g 0  . polyethylene glycol (MIRALAX / GLYCOLAX)  packet TAKE 17G BY MOUTH TWICE A DAY (Patient taking differently: TAKE 17G BY MOUTH TWICE A DAY AS NEED FOR CONSTIPATION) 14 packet 1  . pregabalin (LYRICA) 25 MG capsule Take 1 capsule (25 mg total) by mouth 2 (two) times daily. 60 capsule 3  . Probiotic Product (ALIGN) 4 MG CAPS Take 1 capsule by mouth daily.     . propranolol (INDERAL) 10 MG tablet Take 0.5 tablets (5 mg total) by mouth 2 (two) times daily. 60 tablet 5  . PROTONIX 40 MG PACK TAKE 20 MLS (40 MG TOTAL) BY MOUTH DAILY. 30 each 5  . terconazole (TERAZOL 7) 0.4 % vaginal cream Place 1 applicator vaginally at bedtime. (Patient taking differently: Place 1 applicator vaginally at bedtime as needed (irritation). ) 90 g 3  . traMADol (ULTRAM) 50 MG tablet TAKE 2 TABLETS BY MOUTH 3 TIMES A DAY AS NEEDED 180 tablet 1  . traZODone (DESYREL) 50 MG tablet TAKE 1 TABLET (50 MG TOTAL) BY MOUTH AT BEDTIME AS NEEDED. FOR SLEEP 30 tablet 5  . triamcinolone ointment (KENALOG) 0.5 % Apply 1 application topically 2 (two) times daily. 30 g 0  . warfarin (COUMADIN) 2 MG tablet Take 2 tablets (4 mg total) by mouth one time only at 6 PM. 60 tablet 1   No current facility-administered medications on file prior to visit.    Allergies  Allergen Reactions  . Valtrex [Valacyclovir Hcl] Cough    Cough and gagging   Family History  Problem Relation Age of Onset  . Asthma Mother   . Asthma Father   . Prostate cancer Father   . Stroke Brother   . Breast cancer Sister   . Stomach cancer Sister   . Thyroid disease Neg Hx    PE: Pulse 88   Ht 5' (1.524 m)   SpO2 94%   BMI 35.15 kg/m ; patient could not be weighed, since she has right hemiparesis and she is a wheelchair. BP could not be measured. Wt Readings from Last 3 Encounters:  11/25/16 180 lb (81.6 kg)  11/17/16 180 lb (81.6 kg)  10/30/16 176 lb (79.8 kg)   Constitutional: overweight, in NAD, in wheelchair, nonverbal Eyes: PERRLA, EOMI, no exophthalmos ENT: moist mucous membranes, no  thyromegaly, no cervical lymphadenopathy Cardiovascular: RRR, No MRG, + L leg pitting edema Respiratory: CTA B Gastrointestinal: abdomen soft, NT, ND, BS+ Musculoskeletal: no deformities, R hemiparesis - arm and leg immobile Skin: moist, warm, no rashes Neurological: no tremor with outstretched L hand, R hemiparesis, expressive aphasia  ASSESSMENT: 1. Nodular goiter  2. Tracheal deviation  3. Dysphagia  PLAN: 1., 2.  And 3.  - Patient with large goiter compressing and displacing the trachea per review of her her neck CT and barium swallow studies.  She also had significant scattered lymphadenopathy on her CT scan from 04/2016. She has significant neck compression symptoms, most likely due to the goiter and not her stroke for the barium swallow test and previous evaluation.  She is still choking when she eats and regurgitating.  - At last visit, and again today, we discussed with her daughter about our options: A.  Continue to follow her clinically with great attention to her meals - family wanted to be a little more aggressive especially after her aspiration pneumonia B.  Thyroidectomy - I refer her to surgery (Dr. Harlow Asa), who did not feel that she is a good surgical candidate and referred for further to ENT (Dr. Vicie Mutters) and he also did not feel that the surgery would be safe for her. C.  Repeat RAI Tx - it is not very likely that this would cause further reduction of her goiter beyond the first RAI treatment.  She did not get relief of her symptoms after her RAI treatment in 2013.  However, since this has at least one chance of shrinking her goiter, daughter decided for this.  We will make arrangement for it and we discussed about the possible subsequent hypothyroidism. She will the need to be on levothyroxine.  We discussed about correct dosing of this.  We plan to have her back for lab check 1 month after the treatment.  - We will check her TFTs today and adjust the methimazole dose  accordingly.  Currently on 5 mg twice a week. - I will see her back in 3 months  - time spent with the patient and her daughter: 25 min, of which >50% was spent in obtaining information about her symptoms, reviewing her previous labs, evaluations by previous consultants, and treatments, counseling her about her condition (please see the discussed topics above), and developing a plan to further investigate it;  patient's daughter had a number of questions which I addressed.  Office Visit on 01/11/2017  Component Date Value Ref Range Status  . TSH 01/11/2017 1.78  0.35 - 4.50 uIU/mL Final  . Free T4 01/11/2017 0.99  0.60 - 1.60 ng/dL Final   Comment: Specimens from patients who are undergoing biotin therapy and /or ingesting biotin supplements may contain high levels of biotin.  The higher biotin concentration in these specimens interferes with this Free T4 assay.  Specimens that contain high levels  of biotin may cause false high results for this Free T4 assay.  Please interpret results in light of the total clinical presentation of the patient.    . T3, Free 01/11/2017 2.7  2.3 - 4.2 pg/mL Final   Normal TFTs.  We will continue the current dose of methimazole.  Philemon Kingdom, MD PhD [facility] Endocrinology

## 2017-01-12 ENCOUNTER — Telehealth: Payer: Self-pay | Admitting: Hematology and Oncology

## 2017-01-12 ENCOUNTER — Inpatient Hospital Stay: Payer: Medicare Other | Attending: Hematology and Oncology | Admitting: Hematology and Oncology

## 2017-01-12 ENCOUNTER — Inpatient Hospital Stay: Payer: Medicare Other

## 2017-01-12 ENCOUNTER — Encounter: Payer: Self-pay | Admitting: Hematology and Oncology

## 2017-01-12 DIAGNOSIS — Z7901 Long term (current) use of anticoagulants: Secondary | ICD-10-CM | POA: Diagnosis not present

## 2017-01-12 DIAGNOSIS — D473 Essential (hemorrhagic) thrombocythemia: Secondary | ICD-10-CM | POA: Insufficient documentation

## 2017-01-12 DIAGNOSIS — E042 Nontoxic multinodular goiter: Secondary | ICD-10-CM | POA: Diagnosis not present

## 2017-01-12 DIAGNOSIS — Z79811 Long term (current) use of aromatase inhibitors: Secondary | ICD-10-CM | POA: Diagnosis not present

## 2017-01-12 DIAGNOSIS — G8110 Spastic hemiplegia affecting unspecified side: Secondary | ICD-10-CM

## 2017-01-12 DIAGNOSIS — Z86711 Personal history of pulmonary embolism: Secondary | ICD-10-CM | POA: Insufficient documentation

## 2017-01-12 LAB — CBC WITH DIFFERENTIAL/PLATELET
Abs Granulocyte: 3.7 10*3/uL (ref 1.5–6.5)
Basophils Absolute: 0 10*3/uL (ref 0.0–0.1)
Basophils Relative: 1 %
Eosinophils Absolute: 0.1 10*3/uL (ref 0.0–0.5)
Eosinophils Relative: 3 %
HCT: 45.4 % (ref 34.8–46.6)
Hemoglobin: 14.6 g/dL (ref 11.6–15.9)
Lymphocytes Relative: 18 %
Lymphs Abs: 0.9 10*3/uL (ref 0.9–3.3)
MCH: 37.7 pg — ABNORMAL HIGH (ref 25.1–34.0)
MCHC: 32.2 g/dL (ref 31.5–36.0)
MCV: 117.3 fL — ABNORMAL HIGH (ref 79.5–101.0)
Monocytes Absolute: 0.3 10*3/uL (ref 0.1–0.9)
Monocytes Relative: 6 %
Neutro Abs: 3.7 10*3/uL (ref 1.5–6.5)
Neutrophils Relative %: 72 %
Platelets: 359 10*3/uL (ref 145–400)
RBC: 3.87 MIL/uL (ref 3.70–5.45)
RDW: 17.2 % — ABNORMAL HIGH (ref 11.2–16.1)
WBC: 5.1 10*3/uL (ref 3.9–10.3)

## 2017-01-12 NOTE — Assessment & Plan Note (Signed)
She tolerated hydroxyurea well. She will take 500 mg daily from Monday to Fridays and 1000 mg on Saturdays and Sundays Her blood counts are stable.  I recommend we continue the same.

## 2017-01-12 NOTE — Assessment & Plan Note (Signed)
The patient has been diagnosed with goiter Her daughter inquire about radioactive iodine for this I think it is reasonable approach and there is no contraindication from my standpoint for her to proceed.

## 2017-01-12 NOTE — Telephone Encounter (Signed)
Gave patient avs and calendar with appts per 1/8 los.  °

## 2017-01-12 NOTE — Progress Notes (Signed)
[place] OFFICE PROGRESS NOTE  Patient Care Team: [name], MD as PCP - General (Internal Medicine) Eunice Blase, MD (Inactive) as Consulting Physician (Family Medicine) Renato Shin, MD as Consulting Physician (Endocrinology) Netta Cedars, MD as Consulting Physician (Orthopedic Surgery) Lyndal Pulley, DO (Sports Medicine) Garvin Fila, MD (Neurology)  SUMMARY OF ONCOLOGIC HISTORY:   Essential thrombocytosis ([place])   08/07/2013 Miscellaneous    The patient is noted to have elevated platelet count      03/08/2014 Imaging    Positive for acute PE with CT evidence of right heart strain (RV/LV Ratio = 0.9) consistent with at least submassive (intermediate risk)PE.       03/09/2014 Imaging    US venous Doppler showed deep vein thrombosis noted in the right distal common femoral vein, femoral vein, and popliteal vein. DVT noted in the left femoral and popliteal veins      03/09/2014 Imaging    Patchy areas of acute left MCA territory infarction. 2. Several punctate foci of acute right MCA territory infarction. Possible trace subarachnoid hemorrhage in the high right frontal lobe. 3. Occluded left ICA and left MCA      03/20/2015 Imaging    Compared to MRI on 03/09/14, there has been expected evolutional change of left MCA infarction. In addition, there may be a few areas of acute-subacute infarcts in the left basal ganglia vs artifact.        07/31/2015 Pathology Results    Peripheral blood is positive for JAK2 mutation      07/31/2015 -  Chemotherapy    She is started on 500 mg daily Hydrea      08/15/2015 Miscellaneous    The dose of hydroxyurea is increased to 1000 mg daily      09/26/2015 Miscellaneous    Dose of Hydrea to 500 mg daily except on Saturdays and Sundays she takes 1000 mg.      10/25/2015 Adverse Reaction    Dose of Hydrea is reduced to 500 mg daily       10 /04/2016 Miscellaneous    From 2017 to 2018, the dose of Hydrea is further  adjusted.       INTERVAL HISTORY: Please see below for problem oriented charting. She returns with her daughter for further follow-up She has been compliant taking her hydroxyurea as directed History is not possible from the patient due to dysarthria Her daughter did not report any recent complications from bleeding. She is inquiring about the use of radioactive iodine for treatment of goiter  REVIEW OF SYSTEMS:   Unable to obtain I have reviewed the past medical history, past surgical history, social history and family history with the patient/family and they are unchanged from previous note.  ALLERGIES:  is allergic to valtrex [valacyclovir hcl].  MEDICATIONS:  Current Outpatient Medications  Medication Sig Dispense Refill  . acetaminophen (TYLENOL) 160 MG/5ML liquid Take 480 mg by mouth every 4 (four) hours as needed for fever or pain.    [name] albuterol (PROVENTIL HFA) 108 (90 Base) MCG/ACT inhaler Inhale 2 puffs into the lungs every 6 (six) hours as needed for wheezing or shortness of breath. 1 Inhaler 1  . amoxicillin-clavulanate (AUGMENTIN) 875-125 MG tablet Take 1 tablet by mouth 2 (two) times daily. 20 tablet 0  . calamine lotion Apply 1 application as needed topically for itching. 120 mL 0  . diclofenac sodium (VOLTAREN) 1 % GEL APPLY (2GMS) TOPICALLY THREE TIMES DAILY. 300 g 5  . hydroxyurea (HYDREA)  500 MG capsule Take 1 tablet (500 mg) Monday through Friday, take 2 tablets ( 1000 mg ) Saturday and Sunday 90 capsule 3  . ipratropium-albuterol (DUONEB) 0.5-2.5 (3) MG/3ML SOLN Take 3 mLs by nebulization every 6 (six) hours as needed. (Patient taking differently: Take 3 mLs by nebulization every 6 (six) hours as needed (shortness of breath). ) 360 mL 0  . lidocaine (LIDODERM) 5 % Place 1 patch daily onto the skin. Remove & Discard patch within 12 hours or as directed by MD 15 patch 0  . methimazole (TAPAZOLE) 5 MG tablet TAKE 1 TABLET BY MOUTH TWICE WEEKLY 12 tablet 1  . nystatin  (MYCOSTATIN/NYSTOP) powder Apply topically 3 (three) times daily. 30 g 0  . polyethylene glycol (MIRALAX / GLYCOLAX) packet TAKE 17G BY MOUTH TWICE A DAY (Patient taking differently: TAKE 17G BY MOUTH TWICE A DAY AS NEED FOR CONSTIPATION) 14 packet 1  . pregabalin (LYRICA) 25 MG capsule Take 1 capsule (25 mg total) by mouth 2 (two) times daily. 60 capsule 3  . Probiotic Product (ALIGN) 4 MG CAPS Take 1 capsule by mouth daily.     . propranolol (INDERAL) 10 MG tablet Take 0.5 tablets (5 mg total) by mouth 2 (two) times daily. 60 tablet 5  . PROTONIX 40 MG PACK TAKE 20 MLS (40 MG TOTAL) BY MOUTH DAILY. 30 each 5  . terconazole (TERAZOL 7) 0.4 % vaginal cream Place 1 applicator vaginally at bedtime. (Patient taking differently: Place 1 applicator vaginally at bedtime as needed (irritation). ) 90 g 3  . traMADol (ULTRAM) 50 MG tablet TAKE 2 TABLETS BY MOUTH 3 TIMES A DAY AS NEEDED 180 tablet 1  . traZODone (DESYREL) 50 MG tablet TAKE 1 TABLET (50 MG TOTAL) BY MOUTH AT BEDTIME AS NEEDED. FOR SLEEP 30 tablet 5  . triamcinolone ointment (KENALOG) 0.5 % Apply 1 application topically 2 (two) times daily. 30 g 0  . warfarin (COUMADIN) 2 MG tablet Take 2 tablets (4 mg total) by mouth one time only at 6 PM. 60 tablet 1   No current facility-administered medications for this visit.     PHYSICAL EXAMINATION: ECOG PERFORMANCE STATUS: 2 - Symptomatic, <50% confined to bed  Vitals:   01/12/17 1439  BP: (!) 109/58  Pulse: 86  Resp: 18  Temp: 98 F (36.7 C)  SpO2: 96%   There were no vitals filed for this visit.  GENERAL:alert, no distress and comfortable SKIN: skin color, texture, turgor are normal, no rashes or significant lesions EYES: normal, Conjunctiva are pink and non-injected, sclera clear OROPHARYNX:no exudate, no erythema and lips, buccal mucosa, and tongue normal  NECK: supple, thyroid normal size, non-tender, without nodularity LYMPH:  no palpable lymphadenopathy in the cervical, axillary  or inguinal LUNGS: clear to auscultation and percussion with normal breathing effort HEART: regular rate & rhythm and no murmurs with mild bilateral lower extremity edema ABDOMEN:abdomen soft, non-tender and normal bowel sounds Musculoskeletal:no cyanosis of digits and no clubbing  NEURO: Persistent right-sided paresis with dysarthria  LABORATORY DATA:  I have reviewed the data as listed    Component Value Date/Time   NA 142 10/06/2016 1352   K 4.1 10/06/2016 1352   CL 108 05/22/2016 0409   CO2 27 10/06/2016 1352   GLUCOSE 90 10/06/2016 1352   BUN 12.9 10/06/2016 1352   CREATININE 0.6 10/06/2016 1352   CALCIUM 9.4 10/06/2016 1352   PROT 6.6 01/26/2016 2115   ALBUMIN 3.4 (L) 01/26/2016 2115   AST 16 01/26/2016  2115   ALT 13 (L) 01/26/2016 2115   ALKPHOS 61 01/26/2016 2115   BILITOT 0.5 01/26/2016 2115   GFRNONAA >60 05/22/2016 0409   GFRAA >60 05/22/2016 0409    No results found for: SPEP, UPEP  Lab Results  Component Value Date   WBC 5.1 01/12/2017   NEUTROABS 3.7 01/12/2017   HGB 14.6 01/12/2017   HCT 45.4 01/12/2017   MCV 117.3 (H) 01/12/2017   PLT 359 01/12/2017      Chemistry      Component Value Date/Time   NA 142 10/06/2016 1352   K 4.1 10/06/2016 1352   CL 108 05/22/2016 0409   CO2 27 10/06/2016 1352   BUN 12.9 10/06/2016 1352   CREATININE 0.6 10/06/2016 1352      Component Value Date/Time   CALCIUM 9.4 10/06/2016 1352   ALKPHOS 61 01/26/2016 2115   AST 16 01/26/2016 2115   ALT 13 (L) 01/26/2016 2115   BILITOT 0.5 01/26/2016 2115       ASSESSMENT & PLAN:  Essential thrombocytosis ([place]) She tolerated hydroxyurea well. She will take 500 mg daily from Monday to Fridays and 1000 mg on Saturdays and Sundays Her blood counts are stable.  I recommend we continue the same.  History of pulmonary embolism Due to her high risk disease, she will continue anticoagulation therapy indefinitely She will continue INR monitoring through her primary care  doctor.  Spastic hemiparesis affecting dominant side ([place]), right She have no further recurrence of stroke She will continue hydroxyurea and anticoagulation therapy indefinitely  Multinodular goiter The patient has been diagnosed with goiter Her daughter inquire about radioactive iodine for this I think it is reasonable approach and there is no contraindication from my standpoint for her to proceed.   No orders of the defined types were placed in this encounter.  All questions were answered. The patient knows to call the clinic with any problems, questions or concerns. No barriers to learning was detected. I spent 10 minutes counseling the patient face to face. The total time spent in the appointment was 15 minutes and more than 50% was on counseling and review of test results     Heath Lark, MD 01/12/2017 6:05 PM

## 2017-01-12 NOTE — Assessment & Plan Note (Signed)
Due to her high risk disease, she will continue anticoagulation therapy indefinitely She will continue INR monitoring through her primary care doctor.

## 2017-01-12 NOTE — Assessment & Plan Note (Signed)
She have no further recurrence of stroke She will continue hydroxyurea and anticoagulation therapy indefinitely

## 2017-01-19 ENCOUNTER — Ambulatory Visit (HOSPITAL_BASED_OUTPATIENT_CLINIC_OR_DEPARTMENT_OTHER): Payer: Medicare Other | Admitting: Physical Medicine & Rehabilitation

## 2017-01-19 ENCOUNTER — Encounter: Payer: Self-pay | Admitting: Physical Medicine & Rehabilitation

## 2017-01-19 ENCOUNTER — Encounter: Payer: Medicare Other | Attending: Physical Medicine & Rehabilitation

## 2017-01-19 ENCOUNTER — Ambulatory Visit: Payer: Medicare Other

## 2017-01-19 VITALS — BP 104/71 | HR 87

## 2017-01-19 DIAGNOSIS — G8110 Spastic hemiplegia affecting unspecified side: Secondary | ICD-10-CM

## 2017-01-19 NOTE — Patient Instructions (Signed)

## 2017-01-19 NOTE — Progress Notes (Signed)
Dysport Injection for spasticity using needle EMG guidance  Dilution: 200 Units/ml Indication: Severe spasticity which interferes with ADL,mobility and/or  hygiene and is unresponsive to medication management and other conservative care Informed consent was obtained after describing risks and benefits of the procedure with the patient. This includes bleeding, bruising, infection, excessive weakness, or medication side effects. A REMS form is on file and signed. Needle:  needle electrode Number of units per muscle FDP 200 Pronator quadratus 100 All injections were done after obtaining appropriate EMG activity and after negative drawback for blood. The patient tolerated the procedure well. Post procedure instructions were given. A followup appointment was made.

## 2017-01-21 ENCOUNTER — Other Ambulatory Visit: Payer: Self-pay | Admitting: General Practice

## 2017-01-21 ENCOUNTER — Other Ambulatory Visit: Payer: Self-pay | Admitting: Internal Medicine

## 2017-01-21 MED ORDER — WARFARIN SODIUM 2 MG PO TABS: ORAL_TABLET | ORAL | 3 refills | Status: DC

## 2017-01-22 ENCOUNTER — Ambulatory Visit (INDEPENDENT_AMBULATORY_CARE_PROVIDER_SITE_OTHER): Payer: Medicare Other | Admitting: General Practice

## 2017-01-22 DIAGNOSIS — Z7901 Long term (current) use of anticoagulants: Secondary | ICD-10-CM

## 2017-01-22 DIAGNOSIS — I82593 Chronic embolism and thrombosis of other specified deep vein of lower extremity, bilateral: Secondary | ICD-10-CM

## 2017-01-22 LAB — POCT INR: INR: 3.4

## 2017-01-22 NOTE — Patient Instructions (Addendum)
Pre visit review using our clinic review tool, if applicable. No additional management support is needed unless otherwise documented below in the visit note.  Skip dosage today (1/18) and then continue to take 2 mg daily except 4 mg on Monday and Fridays. Re-check in 4 weeks.

## 2017-01-23 NOTE — Progress Notes (Signed)
Agree with management.  Lorain Keast J Neilani Duffee, MD  

## 2017-02-01 ENCOUNTER — Ambulatory Visit: Payer: Medicare Other | Admitting: Podiatry

## 2017-02-06 ENCOUNTER — Other Ambulatory Visit: Payer: Self-pay | Admitting: Internal Medicine

## 2017-02-07 ENCOUNTER — Emergency Department (HOSPITAL_COMMUNITY): Payer: Medicare Other

## 2017-02-07 ENCOUNTER — Encounter (HOSPITAL_COMMUNITY): Payer: Self-pay | Admitting: Emergency Medicine

## 2017-02-07 ENCOUNTER — Inpatient Hospital Stay (HOSPITAL_COMMUNITY)
Admission: EM | Admit: 2017-02-07 | Discharge: 2017-02-10 | DRG: 871 | Disposition: A | Discharge status: Home Health | Payer: Medicare Other | Attending: Internal Medicine | Admitting: Internal Medicine

## 2017-02-07 ENCOUNTER — Other Ambulatory Visit: Payer: Self-pay

## 2017-02-07 DIAGNOSIS — Z7901 Long term (current) use of anticoagulants: Secondary | ICD-10-CM

## 2017-02-07 DIAGNOSIS — I639 Cerebral infarction, unspecified: Secondary | ICD-10-CM | POA: Diagnosis present

## 2017-02-07 DIAGNOSIS — Z96641 Presence of right artificial hip joint: Secondary | ICD-10-CM | POA: Diagnosis present

## 2017-02-07 DIAGNOSIS — E0520 Thyrotoxicosis with toxic multinodular goiter without thyrotoxic crisis or storm: Secondary | ICD-10-CM | POA: Diagnosis not present

## 2017-02-07 DIAGNOSIS — J189 Pneumonia, unspecified organism: Secondary | ICD-10-CM | POA: Diagnosis not present

## 2017-02-07 DIAGNOSIS — I10 Essential (primary) hypertension: Secondary | ICD-10-CM | POA: Diagnosis present

## 2017-02-07 DIAGNOSIS — E0590 Thyrotoxicosis, unspecified without thyrotoxic crisis or storm: Secondary | ICD-10-CM | POA: Diagnosis present

## 2017-02-07 DIAGNOSIS — K811 Chronic cholecystitis: Secondary | ICD-10-CM

## 2017-02-07 DIAGNOSIS — R531 Weakness: Secondary | ICD-10-CM | POA: Diagnosis not present

## 2017-02-07 DIAGNOSIS — I69320 Aphasia following cerebral infarction: Secondary | ICD-10-CM

## 2017-02-07 DIAGNOSIS — R7989 Other specified abnormal findings of blood chemistry: Secondary | ICD-10-CM

## 2017-02-07 DIAGNOSIS — R6510 Systemic inflammatory response syndrome (SIRS) of non-infectious origin without acute organ dysfunction: Secondary | ICD-10-CM | POA: Diagnosis present

## 2017-02-07 DIAGNOSIS — Z90710 Acquired absence of both cervix and uterus: Secondary | ICD-10-CM

## 2017-02-07 DIAGNOSIS — J690 Pneumonitis due to inhalation of food and vomit: Secondary | ICD-10-CM | POA: Diagnosis present

## 2017-02-07 DIAGNOSIS — K439 Ventral hernia without obstruction or gangrene: Secondary | ICD-10-CM | POA: Diagnosis present

## 2017-02-07 DIAGNOSIS — I69351 Hemiplegia and hemiparesis following cerebral infarction affecting right dominant side: Secondary | ICD-10-CM

## 2017-02-07 DIAGNOSIS — M25511 Pain in right shoulder: Secondary | ICD-10-CM

## 2017-02-07 DIAGNOSIS — K8010 Calculus of gallbladder with chronic cholecystitis without obstruction: Secondary | ICD-10-CM | POA: Diagnosis present

## 2017-02-07 DIAGNOSIS — R05 Cough: Secondary | ICD-10-CM | POA: Diagnosis not present

## 2017-02-07 DIAGNOSIS — R112 Nausea with vomiting, unspecified: Secondary | ICD-10-CM | POA: Diagnosis present

## 2017-02-07 DIAGNOSIS — A419 Sepsis, unspecified organism: Principal | ICD-10-CM | POA: Diagnosis present

## 2017-02-07 DIAGNOSIS — R945 Abnormal results of liver function studies: Secondary | ICD-10-CM | POA: Diagnosis present

## 2017-02-07 DIAGNOSIS — Z86711 Personal history of pulmonary embolism: Secondary | ICD-10-CM

## 2017-02-07 DIAGNOSIS — Z791 Long term (current) use of non-steroidal anti-inflammatories (NSAID): Secondary | ICD-10-CM

## 2017-02-07 DIAGNOSIS — R404 Transient alteration of awareness: Secondary | ICD-10-CM | POA: Diagnosis not present

## 2017-02-07 DIAGNOSIS — R1111 Vomiting without nausea: Secondary | ICD-10-CM | POA: Diagnosis not present

## 2017-02-07 DIAGNOSIS — Z881 Allergy status to other antibiotic agents status: Secondary | ICD-10-CM

## 2017-02-07 DIAGNOSIS — K5641 Fecal impaction: Secondary | ICD-10-CM | POA: Diagnosis present

## 2017-02-07 DIAGNOSIS — D473 Essential (hemorrhagic) thrombocythemia: Secondary | ICD-10-CM | POA: Diagnosis present

## 2017-02-07 LAB — I-STAT CG4 LACTIC ACID, ED: Lactic Acid, Venous: 1.9 mmol/L (ref 0.5–1.9)

## 2017-02-07 LAB — PROTIME-INR
INR: 3.36
Prothrombin Time: 33.8 seconds — ABNORMAL HIGH (ref 11.4–15.2)

## 2017-02-07 MED ORDER — SODIUM CHLORIDE 0.9 % IV BOLUS (SEPSIS)
500.0000 mL | Freq: Once | INTRAVENOUS | Status: AC
Start: 2017-02-07 — End: 2017-02-08
  Administered 2017-02-07: 500 mL via INTRAVENOUS

## 2017-02-07 MED ORDER — DEXTROSE 5 % IV SOLN
1.0000 g | Freq: Once | INTRAVENOUS | Status: AC
  Administered 2017-02-08: 1 g via INTRAVENOUS
  Filled 2017-02-07: qty 10

## 2017-02-07 NOTE — ED Provider Notes (Signed)
Sandy Barrett   CSN: [id number] Arrival date & time: 02/07/17  2215     History   Chief Complaint Chief Complaint  Patient presents with  . Emesis  . Weakness    HPI Sandy Barrett is a 84 y.o. female.  Patient with a history of CVA with Right hemiplegia and aphasia, HTN, thyroid goiter, PE on coumadin presents with emesis x 2 in the last day. Per daughters, who are caregivers, she had one episode at 2:00 am this morning and another this evening. She produces what appears to be mucus. She ate and drank per her usual through today, but seemed less active than her normal. No fever at any time. Daughters do not feel she has had any pain. No congestion, diarrhea. No sick contacts.    The history is provided by a relative and a caregiver. No language interpreter was used.    Past Medical History:  Diagnosis Date  . ARTHRITIS   . Arthritis   . Diverticulosis   . HYPERTENSION   . HYPERTHYROIDISM   . Hyperthyroidism    s/p I-131 ablation 03/2011 of multinod goiter  . INSOMNIA   . MIGRAINE HEADACHE   . OBESITY   . Posttraumatic stress disorder   . Pulmonary embolism ([place]) 03/2014   with DVT  . Stroke [facility]) 03/2014   dysarthria    Patient Active Problem List   Diagnosis Date Noted  . Neuralgia 01/09/2017  . Abnormal urine odor 10/28/2016  . Venous congestion 10/08/2016  . Lymphedema of breast 09/11/2016  . Long term (current) use of anticoagulants [Z79.01] 09/10/2016  . Cough 08/23/2016  . Left hip pain 08/09/2016  . Yeast infection of the skin 07/01/2016  . Abnormal stool color 05/18/2016  . Left leg swelling 05/18/2016  . Pressure injury of skin 04/27/2016  . Aspiration pneumonia ([place]) 04/20/2016  . Hemidiaphragm paralysis   . Wheelchair bound 02/03/2016  . Vaginal bleeding 01/28/2016  . Discoloration of skin 09/26/2015  . Essential thrombocytosis ([place]) 07/31/2015  . SVC (superior vena cava obstruction), chronic  07/20/2015  . Dysphagia 07/20/2015  . Elevated liver enzymes 07/20/2015  . Constipation 07/20/2015  . Thrombophlebitis of breast, right 06/18/2015  . Tracheal deviation 06/06/2015  . Sensorineural hearing loss, bilateral, moderate-moderately severe 04/30/2015  . Abdominal wall lump 03/09/2015  . Frequent UTI 02/06/2015  . Urinary tract infection without hematuria 08/21/2014  . Primary osteoarthritis involving multiple joints 07/26/2014  . Pernicious anemia 07/26/2014  . Hearing loss 07/26/2014  . Encounter for therapeutic drug monitoring 07/17/2014  . Spastic hemiparesis affecting dominant side ([place]), right 05/31/2014  . DVT of lower extremity, bilateral ([place]) 03/14/2014  . Global aphasia 03/14/2014  . 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 Aphasia S/P CVA 03/13/2014  . Cerebral infarction due to embolism of left middle cerebral artery ([place])   . Stroke, embolic ([place]) 74/25/9563  . History of pulmonary embolism 03/08/2014  . Back pain 08/29/2013  . Primary localized osteoarthrosis, lower leg 03/06/2013  . IBS (irritable bowel syndrome)   . Multinodular goiter 01/13/2011  . Hyperthyroidism 11/11/2009  . Insomnia 07/03/2009  . POSTTRAUMATIC STRESS DISORDER 08/22/2008  . OBESITY 04/21/2008  . Osteoarthritis 04/21/2008  . MIGRAINE HEADACHE 04/20/2008  . Essential hypertension 04/20/2008    Past Surgical History:  Procedure Laterality Date  . ABDOMINAL HYSTERECTOMY  1976  . BREAST SURGERY     biopsy  . RADIOLOGY WITH ANESTHESIA Left 03/08/2014   Procedure: RADIOLOGY WITH ANESTHESIA;  Surgeon: Willaim Rayas  Stephani Police, MD;  Location: [place];  Service: Radiology;  Laterality: Left;  . TONSILLECTOMY    . TOTAL HIP ARTHROPLASTY  1998    right    OB History    No data available       Home Medications    Prior to Admission medications   Medication Sig Start Date End Date Taking? Authorizing Provider  acetaminophen (TYLENOL) 160 MG/5ML liquid Take 480 mg by mouth every 4 (four) hours  as needed for fever or pain.    [provider]  albuterol (PROVENTIL HFA) 108 (90 Base) MCG/ACT inhaler Inhale 2 puffs into the lungs every 6 (six) hours as needed for wheezing or shortness of breath. 05/15/15   Burnard Hawthorne, FNP  calamine lotion Apply 1 application as needed topically for itching. 11/09/16   Nche, Charlene Brooke, NP  diclofenac sodium (VOLTAREN) 1 % GEL APPLY (2GMS) TOPICALLY THREE TIMES DAILY. 12/21/16   [name], MD  hydroxyurea (HYDREA) 500 MG capsule Take 1 tablet (500 mg) Monday through Friday, take 2 tablets ( 1000 mg ) Saturday and Sunday 10/06/16   Heath Lark, MD  ipratropium-albuterol (DUONEB) 0.5-2.5 (3) MG/3ML SOLN Take 3 mLs by nebulization every 6 (six) hours as needed. Patient taking differently: Take 3 mLs by nebulization every 6 (six) hours as needed (shortness of breath).  04/29/16   Hosie Poisson, MD  lidocaine (LIDODERM) 5 % Place 1 patch daily onto the skin. Remove & Discard patch within 12 hours or as directed by MD 11/09/16   Nche, Charlene Brooke, NP  methimazole (TAPAZOLE) 5 MG tablet TAKE 1 TABLET BY MOUTH TWICE WEEKLY 12/14/16   Renato Shin, MD  nystatin (MYCOSTATIN/NYSTOP) powder Apply topically 3 (three) times daily. 06/30/16   Heath Lark, MD  polyethylene glycol (MIRALAX / GLYCOLAX) packet TAKE 17G BY MOUTH TWICE A DAY Patient taking differently: TAKE 17G BY MOUTH TWICE A DAY AS NEED FOR CONSTIPATION 03/12/16   Burns, Claudina Lick, MD  pregabalin (LYRICA) 25 MG capsule Take 1 capsule (25 mg total) by mouth 2 (two) times daily. 01/08/17   [name], MD  Probiotic Product (ALIGN) 4 MG CAPS Take 1 capsule by mouth daily.     [provider]  propranolol (INDERAL) 10 MG tablet Take 0.5 tablets (5 mg total) by mouth 2 (two) times daily. 05/26/16   Bonnielee Haff, MD  PROTONIX 40 MG PACK TAKE 20 MLS (40 MG TOTAL) BY MOUTH DAILY. 09/11/16   [name], MD  terconazole (TERAZOL 7) 0.4 % vaginal cream Place 1 applicator vaginally at  bedtime. Patient taking differently: Place 1 applicator vaginally at bedtime as needed (irritation).  04/18/15   Lavonia Drafts, MD  traMADol (ULTRAM) 50 MG tablet TAKE 2 TABLETS BY MOUTH 3 TIMES A DAY AS NEEDED 12/01/16   [name], MD  traZODone (DESYREL) 50 MG tablet TAKE 1 TABLET (50 MG TOTAL) BY MOUTH AT BEDTIME AS NEEDED. FOR SLEEP 09/14/16   [name], MD  triamcinolone ointment (KENALOG) 0.5 % Apply 1 application topically 2 (two) times daily. 06/04/16   [name], MD  warfarin (COUMADIN) 2 MG tablet Take as directed by anticoagulation clinic. 01/21/17   [name], MD  warfarin (COUMADIN) 4 MG tablet TAKE AS DIRECTED BY ANTICOAGULATION CLINIC. 01/21/17   [name], MD    Family History Family History  Problem Relation Age of Onset  . Asthma Mother   . Asthma Father   . Prostate cancer  Father   . Stroke Brother   . Breast cancer Sister   . Stomach cancer Sister   . Thyroid disease Neg Hx     Social History Social History   Tobacco Use  . Smoking status: Never Smoker  . Smokeless tobacco: Never Used  Substance Use Topics  . Alcohol use: No    Alcohol/week: 0.0 oz  . Drug use: No     Allergies   Valtrex [valacyclovir hcl]   Review of Systems Review of Systems  Unable to perform ROS: Patient nonverbal (ROS per daughters)  Constitutional: Negative for fever.  HENT: Negative for congestion and trouble swallowing.   Respiratory: Negative for cough and shortness of breath.   Gastrointestinal: Positive for vomiting. Negative for abdominal distention.  Musculoskeletal:       Right arm is swelling more than usual  Skin: Negative for color change.  Neurological: Negative for syncope.     Physical Exam Updated Vital Signs BP 114/71 (BP Location: Left Arm)   Pulse 92   Temp 97.6 F (36.4 C) (Oral)   Resp 16   SpO2 90%   Physical Exam  Constitutional: She appears well-developed and well-nourished. No distress.  HENT:  Head:  Normocephalic.  Mouth/Throat: Mucous membranes are not pale and dry.  Neck: Normal range of motion. Neck supple.  Cardiovascular: Normal rate and regular rhythm.  Pulmonary/Chest: Effort normal and breath sounds normal. She has no wheezes. She has no rales. She exhibits no tenderness.  Abdominal: Soft. Bowel sounds are normal. She exhibits no distension. There is no tenderness. There is no guarding.  Musculoskeletal: Normal range of motion.  Right arm paralyzed. There is generalized swelling without erythema. No obvious TTP.   Neurological: She is alert. No cranial nerve deficit.  Skin: Skin is warm and dry.  Psychiatric: She has a normal mood and affect.     ED Treatments / Results  Labs (all labs ordered are listed, but only abnormal results are displayed) Labs Reviewed  CBC WITH DIFFERENTIAL/PLATELET  COMPREHENSIVE METABOLIC PANEL  URINALYSIS, ROUTINE W REFLEX MICROSCOPIC   Results for orders placed or performed during the hospital encounter of 02/07/17  CBC with Differential  Result Value Ref Range   WBC 12.1 (H) 4.0 - 10.5 K/uL   RBC 3.63 (L) 3.87 - 5.11 MIL/uL   Hemoglobin 13.9 12.0 - 15.0 g/dL   HCT 42.1 36.0 - 46.0 %   MCV 116.0 (H) 78.0 - 100.0 fL   MCH 38.3 (H) 26.0 - 34.0 pg   MCHC 33.0 30.0 - 36.0 g/dL   RDW 15.8 (H) 11.5 - 15.5 %   Platelets 415 (H) 150 - 400 K/uL   Neutrophils Relative % 90 %   Lymphocytes Relative 6 %   Monocytes Relative 4 %   Eosinophils Relative 0 %   Basophils Relative 0 %   Neutro Abs 10.9 (H) 1.7 - 7.7 K/uL   Lymphs Abs 0.7 0.7 - 4.0 K/uL   Monocytes Absolute 0.5 0.1 - 1.0 K/uL   Eosinophils Absolute 0.0 0.0 - 0.7 K/uL   Basophils Absolute 0.0 0.0 - 0.1 K/uL   RBC Morphology ELLIPTOCYTES   Comprehensive metabolic panel  Result Value Ref Range   Sodium 141 135 - 145 mmol/L   Potassium 3.5 3.5 - 5.1 mmol/L   Chloride 109 101 - 111 mmol/L   CO2 27 22 - 32 mmol/L   Glucose, Bld 134 (H) 65 - 99 mg/dL   BUN 14 6 - 20 mg/dL  Creatinine, Ser 0.41 (L) 0.44 - 1.00 mg/dL   Calcium 8.8 (L) 8.9 - 10.3 mg/dL   Total Protein 6.3 (L) 6.5 - 8.1 g/dL   Albumin 3.1 (L) 3.5 - 5.0 g/dL   AST 146 (H) 15 - 41 U/L   ALT 252 (H) 14 - 54 U/L   Alkaline Phosphatase 154 (H) 38 - 126 U/L   Total Bilirubin 4.7 (H) 0.3 - 1.2 mg/dL   GFR calc non Af Amer >60 >60 mL/min   GFR calc Af Amer >60 >60 mL/min   Anion gap 5 5 - 15  Urinalysis, Routine w reflex microscopic  Result Value Ref Range   Color, Urine AMBER (A) YELLOW   APPearance CLEAR CLEAR   Specific Gravity, Urine 1.023 1.005 - 1.030   pH 5.0 5.0 - 8.0   Glucose, UA NEGATIVE NEGATIVE mg/dL   Hgb urine dipstick MODERATE (A) NEGATIVE   Bilirubin Urine SMALL (A) NEGATIVE   Ketones, ur NEGATIVE NEGATIVE mg/dL   Protein, ur 30 (A) NEGATIVE mg/dL   Nitrite NEGATIVE NEGATIVE   Leukocytes, UA MODERATE (A) NEGATIVE   RBC / HPF 6-30 0 - 5 RBC/hpf   WBC, UA 6-30 0 - 5 WBC/hpf   Bacteria, UA NONE SEEN NONE SEEN   Squamous Epithelial / LPF 0-5 (A) NONE SEEN   Mucus PRESENT   Protime-INR  Result Value Ref Range   Prothrombin Time 33.8 (H) 11.4 - 15.2 seconds   INR 3.36   I-Stat CG4 Lactic Acid, ED  Result Value Ref Range   Lactic Acid, Venous 1.90 0.5 - 1.9 mmol/L   *Barrett: Due to a large number of results and/or encounters for the requested time period, some results have not been displayed. A complete set of results can be found in Results Review.    EKG  EKG Interpretation None       Radiology No results found.  Procedures Procedures (including critical care time)  Medications Ordered in ED Medications  sodium chloride 0.9 % bolus 500 mL (not administered)     Initial Impression / Assessment and Plan / ED Course  I have reviewed the triage vital signs and the nursing notes.  Pertinent labs & imaging results that were available during my care of the patient were reviewed by me and considered in my medical decision making (see chart for details).      Patient presents with daughters who are caregivers. The patient has had 2 episodes of emesis producing mucus. Daughters state decreased activity today but normal eating and drinking. The patient has a known large goiter causing difficulty with swallowing. She is also s/p CVA with aphasia and right hemiplegia, which may be a contributing factor to dysphagia.   She has evidence of left pneumonia. Consider likely aspiration PNA. Rocephin started. She has been hypoxic here with 88% saturations on room air. She is 93% on 2L. Resting comfortably.   She is found to have significantly elevated LFTs. Last comparable was one year ago and they were normal. Daughters deny knowledge of abnormality.   Discussed admission with Dr. Hal Hope who requests CT of abdomen in evaluation of elevated liver tests. CT pending. He will admit the patient for further management.   Final Clinical Impressions(s) / ED Diagnoses   Final diagnoses:  None   1. Left sided pneumonia 2. Abnormal liver function tests   ED Discharge Orders    None       Charlann Lange, Hershal Coria 02/08/17 [id number]    Dene Gentry  C, MD 02/09/17 [phone]

## 2017-02-07 NOTE — ED Notes (Signed)
ED Provider at bedside. 

## 2017-02-07 NOTE — ED Triage Notes (Addendum)
Patient BIB [facility] from home for weakness and cough induced vomiting. Per pt daughter pt vomited a handful of phlegm around 2am, hand a normal day but got progressively weaker. Daughter was putting pt to bed and vomited phlegm again. EMS reports pt sounded slightly congested. Pt has hx of stroke with rt sided def. And aphasia, HTN and migraines.

## 2017-02-07 NOTE — ED Notes (Signed)
Bed: WA01 Expected date:  Expected time:  Means of arrival:  Comments: 84 yo F/Cough

## 2017-02-08 ENCOUNTER — Inpatient Hospital Stay (HOSPITAL_COMMUNITY): Payer: Medicare Other

## 2017-02-08 ENCOUNTER — Encounter (HOSPITAL_COMMUNITY): Payer: Self-pay

## 2017-02-08 ENCOUNTER — Other Ambulatory Visit: Payer: Self-pay

## 2017-02-08 DIAGNOSIS — K5641 Fecal impaction: Secondary | ICD-10-CM | POA: Diagnosis present

## 2017-02-08 DIAGNOSIS — R609 Edema, unspecified: Secondary | ICD-10-CM

## 2017-02-08 DIAGNOSIS — I10 Essential (primary) hypertension: Secondary | ICD-10-CM

## 2017-02-08 DIAGNOSIS — E0520 Thyrotoxicosis with toxic multinodular goiter without thyrotoxic crisis or storm: Secondary | ICD-10-CM | POA: Diagnosis present

## 2017-02-08 DIAGNOSIS — Z86711 Personal history of pulmonary embolism: Secondary | ICD-10-CM | POA: Diagnosis not present

## 2017-02-08 DIAGNOSIS — K811 Chronic cholecystitis: Secondary | ICD-10-CM

## 2017-02-08 DIAGNOSIS — A419 Sepsis, unspecified organism: Secondary | ICD-10-CM | POA: Diagnosis present

## 2017-02-08 DIAGNOSIS — R945 Abnormal results of liver function studies: Secondary | ICD-10-CM

## 2017-02-08 DIAGNOSIS — J189 Pneumonia, unspecified organism: Secondary | ICD-10-CM

## 2017-02-08 DIAGNOSIS — R6510 Systemic inflammatory response syndrome (SIRS) of non-infectious origin without acute organ dysfunction: Secondary | ICD-10-CM | POA: Diagnosis not present

## 2017-02-08 DIAGNOSIS — K8010 Calculus of gallbladder with chronic cholecystitis without obstruction: Secondary | ICD-10-CM | POA: Diagnosis present

## 2017-02-08 DIAGNOSIS — R7989 Other specified abnormal findings of blood chemistry: Secondary | ICD-10-CM | POA: Diagnosis not present

## 2017-02-08 DIAGNOSIS — R112 Nausea with vomiting, unspecified: Secondary | ICD-10-CM | POA: Diagnosis not present

## 2017-02-08 DIAGNOSIS — Z791 Long term (current) use of non-steroidal anti-inflammatories (NSAID): Secondary | ICD-10-CM | POA: Diagnosis not present

## 2017-02-08 DIAGNOSIS — Z90710 Acquired absence of both cervix and uterus: Secondary | ICD-10-CM | POA: Diagnosis not present

## 2017-02-08 DIAGNOSIS — Z881 Allergy status to other antibiotic agents status: Secondary | ICD-10-CM | POA: Diagnosis not present

## 2017-02-08 DIAGNOSIS — Z96641 Presence of right artificial hip joint: Secondary | ICD-10-CM | POA: Diagnosis present

## 2017-02-08 DIAGNOSIS — M19011 Primary osteoarthritis, right shoulder: Secondary | ICD-10-CM | POA: Diagnosis not present

## 2017-02-08 DIAGNOSIS — K8020 Calculus of gallbladder without cholecystitis without obstruction: Secondary | ICD-10-CM | POA: Diagnosis not present

## 2017-02-08 DIAGNOSIS — I69351 Hemiplegia and hemiparesis following cerebral infarction affecting right dominant side: Secondary | ICD-10-CM | POA: Diagnosis not present

## 2017-02-08 DIAGNOSIS — K5939 Other megacolon: Secondary | ICD-10-CM | POA: Diagnosis not present

## 2017-02-08 DIAGNOSIS — D473 Essential (hemorrhagic) thrombocythemia: Secondary | ICD-10-CM | POA: Diagnosis not present

## 2017-02-08 DIAGNOSIS — I69320 Aphasia following cerebral infarction: Secondary | ICD-10-CM | POA: Diagnosis not present

## 2017-02-08 DIAGNOSIS — Z7901 Long term (current) use of anticoagulants: Secondary | ICD-10-CM | POA: Diagnosis not present

## 2017-02-08 DIAGNOSIS — J690 Pneumonitis due to inhalation of food and vomit: Secondary | ICD-10-CM | POA: Diagnosis present

## 2017-02-08 DIAGNOSIS — K439 Ventral hernia without obstruction or gangrene: Secondary | ICD-10-CM | POA: Diagnosis present

## 2017-02-08 LAB — COMPREHENSIVE METABOLIC PANEL
ALT: 252 U/L — ABNORMAL HIGH (ref 14–54)
ALT: 198 U/L — ABNORMAL HIGH (ref 14–54)
AST: 146 U/L — ABNORMAL HIGH (ref 15–41)
AST: 92 U/L — ABNORMAL HIGH (ref 15–41)
Albumin: 3.1 g/dL — ABNORMAL LOW (ref 3.5–5.0)
Albumin: 2.9 g/dL — ABNORMAL LOW (ref 3.5–5.0)
Alkaline Phosphatase: 154 U/L — ABNORMAL HIGH (ref 38–126)
Alkaline Phosphatase: 129 U/L — ABNORMAL HIGH (ref 38–126)
Anion gap: 5 (ref 5–15)
Anion gap: 7 (ref 5–15)
BUN: 14 mg/dL (ref 6–20)
BUN: 13 mg/dL (ref 6–20)
CO2: 27 mmol/L (ref 22–32)
CO2: 24 mmol/L (ref 22–32)
Calcium: 8.8 mg/dL — ABNORMAL LOW (ref 8.9–10.3)
Calcium: 8.5 mg/dL — ABNORMAL LOW (ref 8.9–10.3)
Chloride: 109 mmol/L (ref 101–111)
Chloride: 110 mmol/L (ref 101–111)
Creatinine, Ser: 0.41 mg/dL — ABNORMAL LOW (ref 0.44–1.00)
Creatinine, Ser: 0.35 mg/dL — ABNORMAL LOW (ref 0.44–1.00)
GFR calc Af Amer: >60 mL/min (ref >60)
GFR calc Af Amer: >60 mL/min (ref >60)
GFR calc non Af Amer: >60 mL/min (ref >60)
GFR calc non Af Amer: >60 mL/min (ref >60)
Glucose, Bld: 134 mg/dL — ABNORMAL HIGH (ref 65–99)
Glucose, Bld: 119 mg/dL — ABNORMAL HIGH (ref 65–99)
Potassium: 3.5 mmol/L (ref 3.5–5.1)
Potassium: 3.6 mmol/L (ref 3.5–5.1)
Sodium: 141 mmol/L (ref 135–145)
Sodium: 141 mmol/L (ref 135–145)
Total Bilirubin: 4.7 mg/dL — ABNORMAL HIGH (ref 0.3–1.2)
Total Bilirubin: 2.8 mg/dL — ABNORMAL HIGH (ref 0.3–1.2)
Total Protein: 6.3 g/dL — ABNORMAL LOW (ref 6.5–8.1)
Total Protein: 5.8 g/dL — ABNORMAL LOW (ref 6.5–8.1)

## 2017-02-08 LAB — URINALYSIS, ROUTINE W REFLEX MICROSCOPIC
Bacteria, UA: NONE SEEN
Glucose, UA: NEGATIVE mg/dL
Ketones, ur: NEGATIVE mg/dL
Nitrite: NEGATIVE
Protein, ur: 30 mg/dL — AB
Specific Gravity, Urine: 1.023 (ref 1.005–1.030)
pH: 5 (ref 5.0–8.0)

## 2017-02-08 LAB — CBC WITH DIFFERENTIAL/PLATELET
Basophils Absolute: 0 10*3/uL (ref 0.0–0.1)
Basophils Absolute: 0 10*3/uL (ref 0.0–0.1)
Basophils Relative: 0 %
Basophils Relative: 0 %
Eosinophils Absolute: 0 10*3/uL (ref 0.0–0.7)
Eosinophils Absolute: 0 10*3/uL (ref 0.0–0.7)
Eosinophils Relative: 0 %
Eosinophils Relative: 0 %
HCT: 42.1 % (ref 36.0–46.0)
HCT: 38.7 % (ref 36.0–46.0)
Hemoglobin: 13.9 g/dL (ref 12.0–15.0)
Hemoglobin: 12.8 g/dL (ref 12.0–15.0)
Lymphocytes Relative: 6 %
Lymphocytes Relative: 6 %
Lymphs Abs: 0.7 10*3/uL (ref 0.7–4.0)
Lymphs Abs: 0.7 10*3/uL (ref 0.7–4.0)
MCH: 38.3 pg — ABNORMAL HIGH (ref 26.0–34.0)
MCH: 38.2 pg — ABNORMAL HIGH (ref 26.0–34.0)
MCHC: 33 g/dL (ref 30.0–36.0)
MCHC: 33.1 g/dL (ref 30.0–36.0)
MCV: 116 fL — ABNORMAL HIGH (ref 78.0–100.0)
MCV: 115.5 fL — ABNORMAL HIGH (ref 78.0–100.0)
Monocytes Absolute: 0.5 10*3/uL (ref 0.1–1.0)
Monocytes Absolute: 0.7 10*3/uL (ref 0.1–1.0)
Monocytes Relative: 4 %
Monocytes Relative: 6 %
Neutro Abs: 10.9 10*3/uL — ABNORMAL HIGH (ref 1.7–7.7)
Neutro Abs: 10 10*3/uL — ABNORMAL HIGH (ref 1.7–7.7)
Neutrophils Relative %: 90 %
Neutrophils Relative %: 88 %
Platelets: 415 10*3/uL — ABNORMAL HIGH (ref 150–400)
Platelets: 392 10*3/uL (ref 150–400)
RBC: 3.63 MIL/uL — ABNORMAL LOW (ref 3.87–5.11)
RBC: 3.35 MIL/uL — ABNORMAL LOW (ref 3.87–5.11)
RDW: 15.8 % — ABNORMAL HIGH (ref 11.5–15.5)
RDW: 16 % — ABNORMAL HIGH (ref 11.5–15.5)
WBC: 12.1 10*3/uL — ABNORMAL HIGH (ref 4.0–10.5)
WBC: 11.4 10*3/uL — ABNORMAL HIGH (ref 4.0–10.5)

## 2017-02-08 LAB — GLUCOSE, CAPILLARY
Glucose-Capillary: 144 mg/dL — ABNORMAL HIGH (ref 65–99)
Glucose-Capillary: 81 mg/dL (ref 65–99)
Glucose-Capillary: 99 mg/dL (ref 65–99)

## 2017-02-08 LAB — HEPATIC FUNCTION PANEL
ALT: 197 U/L — ABNORMAL HIGH (ref 14–54)
AST: 87 U/L — ABNORMAL HIGH (ref 15–41)
Albumin: 2.8 g/dL — ABNORMAL LOW (ref 3.5–5.0)
Alkaline Phosphatase: 131 U/L — ABNORMAL HIGH (ref 38–126)
Bilirubin, Direct: 1.7 mg/dL — ABNORMAL HIGH (ref 0.1–0.5)
Indirect Bilirubin: 1.2 mg/dL — ABNORMAL HIGH (ref 0.3–0.9)
Total Bilirubin: 2.9 mg/dL — ABNORMAL HIGH (ref 0.3–1.2)
Total Protein: 5.7 g/dL — ABNORMAL LOW (ref 6.5–8.1)

## 2017-02-08 LAB — PROTIME-INR
INR: 3.25
Prothrombin Time: 32.9 seconds — ABNORMAL HIGH (ref 11.4–15.2)

## 2017-02-08 LAB — PROCALCITONIN: Procalcitonin: 0.3 ng/mL

## 2017-02-08 LAB — INFLUENZA PANEL BY PCR (TYPE A & B)
Influenza A By PCR: NEGATIVE
Influenza B By PCR: NEGATIVE

## 2017-02-08 LAB — MRSA PCR SCREENING: MRSA by PCR: POSITIVE — AB

## 2017-02-08 LAB — HIV ANTIBODY (ROUTINE TESTING W REFLEX): HIV Screen 4th Generation wRfx: NONREACTIVE

## 2017-02-08 LAB — LACTIC ACID, PLASMA: Lactic Acid, Venous: 0.8 mmol/L (ref 0.5–1.9)

## 2017-02-08 LAB — APTT: aPTT: 49 seconds — ABNORMAL HIGH (ref 24–36)

## 2017-02-08 MED ORDER — ONDANSETRON HCL 4 MG/2ML IJ SOLN: 4.0000 mg | Freq: Four times a day (QID) | INTRAMUSCULAR | Status: DC | PRN

## 2017-02-08 MED ORDER — TRAMADOL HCL 50 MG PO TABS
50.0000 mg | ORAL_TABLET | Freq: Two times a day (BID) | ORAL | Status: DC | PRN
  Administered 2017-02-08 – 2017-02-10 (×4): 50 mg via ORAL
  Filled 2017-02-08 (×4): qty 1

## 2017-02-08 MED ORDER — FLEET ENEMA 7-19 GM/118ML RE ENEM
1.0000 | ENEMA | Freq: Once | RECTAL | Status: AC
  Administered 2017-02-08: 1 via RECTAL
  Filled 2017-02-08: qty 1

## 2017-02-08 MED ORDER — IOPAMIDOL (ISOVUE-300) INJECTION 61%
INTRAVENOUS | Status: AC
  Administered 2017-02-08: 100 mL via INTRAVENOUS
  Filled 2017-02-08: qty 100

## 2017-02-08 MED ORDER — MORPHINE SULFATE (PF) 4 MG/ML IV SOLN
3.0000 mg | Freq: Once | INTRAVENOUS | Status: AC
  Administered 2017-02-08: 3 mg via INTRAVENOUS
  Filled 2017-02-08: qty 1

## 2017-02-08 MED ORDER — TRAZODONE HCL 50 MG PO TABS
50.0000 mg | ORAL_TABLET | Freq: Every evening | ORAL | Status: DC | PRN
  Administered 2017-02-08 – 2017-02-09 (×2): 50 mg via ORAL
  Filled 2017-02-08 (×2): qty 1

## 2017-02-08 MED ORDER — SODIUM CHLORIDE 0.9 % IV SOLN
INTRAVENOUS | Status: AC
  Administered 2017-02-08: 06:00:00 via INTRAVENOUS

## 2017-02-08 MED ORDER — PROPRANOLOL HCL 10 MG PO TABS
5.0000 mg | ORAL_TABLET | Freq: Two times a day (BID) | ORAL | Status: DC
  Administered 2017-02-08 – 2017-02-10 (×4): 5 mg via ORAL
  Filled 2017-02-08 (×4): qty 1

## 2017-02-08 MED ORDER — DICLOFENAC SODIUM 1 % TD GEL
2.0000 g | Freq: Three times a day (TID) | TRANSDERMAL | Status: DC | PRN
  Administered 2017-02-08: 23:00:00 2 g via TOPICAL
  Filled 2017-02-08: qty 100

## 2017-02-08 MED ORDER — DEXTROSE 5 % IV SOLN
500.0000 mg | Freq: Every day | INTRAVENOUS | Status: DC
  Administered 2017-02-08: 500 mg via INTRAVENOUS
  Filled 2017-02-08: qty 500

## 2017-02-08 MED ORDER — SODIUM CHLORIDE 0.9 % IV SOLN
1250.0000 mg | Freq: Once | INTRAVENOUS | Status: AC
  Administered 2017-02-08: 1250 mg via INTRAVENOUS
  Filled 2017-02-08: qty 1250

## 2017-02-08 MED ORDER — ACETAMINOPHEN 650 MG RE SUPP: 650.0000 mg | Freq: Four times a day (QID) | RECTAL | Status: DC | PRN

## 2017-02-08 MED ORDER — LIDOCAINE 5 % EX PTCH
1.0000 | MEDICATED_PATCH | Freq: Every day | CUTANEOUS | Status: DC | PRN
  Filled 2017-02-08: qty 1

## 2017-02-08 MED ORDER — PREGABALIN 25 MG PO CAPS
25.0000 mg | ORAL_CAPSULE | Freq: Two times a day (BID) | ORAL | Status: DC
  Administered 2017-02-08 – 2017-02-10 (×4): 25 mg via ORAL
  Filled 2017-02-08 (×4): qty 1

## 2017-02-08 MED ORDER — ONDANSETRON HCL 4 MG PO TABS: 4.0000 mg | ORAL_TABLET | Freq: Four times a day (QID) | ORAL | Status: DC | PRN

## 2017-02-08 MED ORDER — ACETAMINOPHEN 325 MG PO TABS
650.0000 mg | ORAL_TABLET | Freq: Four times a day (QID) | ORAL | Status: DC | PRN
  Administered 2017-02-10: 650 mg via ORAL
  Filled 2017-02-08: qty 2

## 2017-02-08 MED ORDER — TECHNETIUM TC 99M MEBROFENIN IV KIT
7.3000 | PACK | Freq: Once | INTRAVENOUS | Status: AC | PRN
  Administered 2017-02-08: 7.3 via INTRAVENOUS

## 2017-02-08 MED ORDER — PANTOPRAZOLE SODIUM 40 MG PO PACK
40.0000 mg | PACK | Freq: Every day | ORAL | Status: DC
  Administered 2017-02-09 – 2017-02-10 (×2): 40 mg via ORAL
  Filled 2017-02-08 (×3): qty 20

## 2017-02-08 MED ORDER — RISAQUAD PO CAPS
1.0000 | ORAL_CAPSULE | Freq: Every day | ORAL | Status: DC
  Administered 2017-02-09 – 2017-02-10 (×2): 1 via ORAL
  Filled 2017-02-08 (×2): qty 1

## 2017-02-08 MED ORDER — IOPAMIDOL (ISOVUE-300) INJECTION 61%
100.0000 mL | Freq: Once | INTRAVENOUS | Status: AC | PRN
  Administered 2017-02-08: 100 mL via INTRAVENOUS

## 2017-02-08 MED ORDER — PIPERACILLIN-TAZOBACTAM 3.375 G IVPB
3.3750 g | Freq: Three times a day (TID) | INTRAVENOUS | Status: DC
  Administered 2017-02-08 – 2017-02-10 (×7): 3.375 g via INTRAVENOUS
  Filled 2017-02-08 (×6): qty 50

## 2017-02-08 MED ORDER — VANCOMYCIN HCL 10 G IV SOLR: 1250.0000 mg | INTRAVENOUS | Status: DC

## 2017-02-08 MED ORDER — IPRATROPIUM-ALBUTEROL 0.5-2.5 (3) MG/3ML IN SOLN: 3.0000 mL | Freq: Four times a day (QID) | RESPIRATORY_TRACT | Status: DC | PRN

## 2017-02-08 NOTE — Progress Notes (Signed)
Verbal order received to nasally suction patient.  Sterile technique used and entered via left nare with approximately 100cc of chunky contents received.  saturations remained above 92% throughout procedure. Pt. Tolerated very well.

## 2017-02-08 NOTE — Progress Notes (Signed)
PROGRESS NOTE    Sandy Barrett  FAO:[id number] DOB: 1932-09-11 DOA: 02/07/2017 PCP: [name], MD    Brief Narrative:  84 y.o. female with history of stroke with right-sided hemiplegia and aphasia, PE, hyperthyroidism, essential thrombocytosis was brought to the ER patient's daughter found that patient had thrown up twice yesterday.  Was the time patient threw up mucoid.  Patient is aphasic and does not complain but did not notice any chest pain or shortness of breath or any abdominal pain.  Has not had any diarrhea.  No fever or chills.  Only medication change was Lyrica from gabapentin.   Assessment & Plan:   Principal Problem:   SIRS (systemic inflammatory response syndrome) (HCC) Active Problems:   Hyperthyroidism   Essential hypertension   Stroke, embolic (HCC)   History of pulmonary embolism   Essential thrombocytosis (HCC)   Nausea & vomiting   CAP (community acquired pneumonia)   Abnormal liver function tests  1. SIRS secondary to pneumonia with cholecystitis 1. General surgery consulted. Recs reviewed. Not ideal surgical candidate. Rec for perc drain by IR 2. HIDA reviewed. Delayed filling of gallbladder noted 3. Will keep clears for now as tolerated 4. Continue empiric vanc and zosyn. MRSA nasal swab pending. If neg, then would continue zosyn monotherapy 2. History of PE on Coumadin therapeutic 1. Patient continued on heparin gtt 3. Essential thrombocytosis 1. Continued on hydroxyurea. 4. Hyperthyroism on Tapazole  1. Tapazole held secondary to elevated LFTs. 5. History of stroke with right-sided hemiparesis and aphasia presently on anticoagulation. 1. Seems stable at this time 2. Pharmacy consulted for heparin gtt  DVT prophylaxis: heparin gtt Code Status: Partial Family Communication: Pt in room, family at bedside Disposition Plan: Uncertain at this time  Consultants:   General Surgery  IR  Procedures:   HIDA  2/4  Antimicrobials: Anti-infectives (From admission, onward)   Start     Dose/Rate Route Frequency Ordered Stop   02/10/17 0600  vancomycin (VANCOCIN) 1,250 mg in sodium chloride 0.9 % 250 mL IVPB     1,250 mg 166.7 mL/hr over 90 Minutes Intravenous Every 48 hours 02/08/17 0503     02/08/17 0515  piperacillin-tazobactam (ZOSYN) IVPB 3.375 g     3.375 g 12.5 mL/hr over 240 Minutes Intravenous Every 8 hours 02/08/17 0503     02/08/17 0515  vancomycin (VANCOCIN) 1,250 mg in sodium chloride 0.9 % 250 mL IVPB     1,250 mg 166.7 mL/hr over 90 Minutes Intravenous  Once 02/08/17 0503 02/08/17 1113   02/08/17 0500  azithromycin (ZITHROMAX) 500 mg in dextrose 5 % 250 mL IVPB  Status:  Discontinued     500 mg 250 mL/hr over 60 Minutes Intravenous Daily 02/08/17 0452 02/08/17 1102   02/07/17 2345  cefTRIAXone (ROCEPHIN) 1 g in dextrose 5 % 50 mL IVPB     1 g 100 mL/hr over 30 Minutes Intravenous  Once 02/07/17 2337 02/08/17 0138       Subjective: Without events overnight  Objective: Vitals:   02/08/17 0230 02/08/17 0300 02/08/17 0441 02/08/17 1546  BP: 117/73 118/71 130/77 (!) 154/95  Pulse: (!) 103 (!) 107 100 (!) 103  Resp:   18 18  Temp:   97.7 F (36.5 C)   TempSrc:   Oral   SpO2: 93% 93% 98% 98%  Weight:   75.6 kg (166 lb 9.6 oz)   Height:   5' (1.524 m)     Intake/Output Summary (Last 24 hours) at 02/08/2017 1601 Last  data filed at 02/08/2017 1100 Gross per 24 hour  Intake 875 ml  Output -  Net 875 ml   Filed Weights   02/07/17 2239 02/08/17 0441  Weight: 79.8 kg (176 lb) 75.6 kg (166 lb 9.6 oz)    Examination:  General exam: Appears calm and comfortable  Respiratory system: Clear to auscultation. Respiratory effort normal. Cardiovascular system: S1 & S2 heard, RRR. Gastrointestinal system: Abdomen is nondistended, soft and nontender. No organomegaly or masses felt. Normal bowel sounds heard. Central nervous system: Alert, tracks with eyes Extremities: Symmetric 5 x  5 power. Skin: No rashes, lesions  Psychiatry: difficult to assess given baseline aphasia  Data Reviewed: I have personally reviewed following labs and imaging studies  CBC: Recent Labs  Lab 02/07/17 2319 02/08/17 0631  WBC 12.1* 11.4*  NEUTROABS 10.9* 10.0*  HGB 13.9 12.8  HCT 42.1 38.7  MCV 116.0* 115.5*  PLT 415* 409   Basic Metabolic Panel: Recent Labs  Lab 02/07/17 2319 02/08/17 0631  NA 141 141  K 3.5 3.6  CL 109 110  CO2 27 24  GLUCOSE 134* 119*  BUN 14 13  CREATININE 0.41* 0.35*  CALCIUM 8.8* 8.5*   GFR: Estimated Creatinine Clearance: 49.2 mL/min (A) (by C-G formula based on SCr of 0.35 mg/dL (L)). Liver Function Tests: Recent Labs  Lab 02/07/17 2319 02/08/17 0631  AST 146* 87*  92*  ALT 252* 197*  198*  ALKPHOS 154* 131*  129*  BILITOT 4.7* 2.9*  2.8*  PROT 6.3* 5.7*  5.8*  ALBUMIN 3.1* 2.8*  2.9*   No results for input(s): LIPASE, AMYLASE in the last 168 hours. No results for input(s): AMMONIA in the last 168 hours. Coagulation Profile: Recent Labs  Lab 02/07/17 2319 02/08/17 0631  INR 3.36 3.25   Cardiac Enzymes: No results for input(s): CKTOTAL, CKMB, CKMBINDEX, TROPONINI in the last 168 hours. BNP (last 3 results) No results for input(s): PROBNP in the last 8760 hours. HbA1C: No results for input(s): HGBA1C in the last 72 hours. CBG: Recent Labs  Lab 02/08/17 0738  GLUCAP 144*   Lipid Profile: No results for input(s): CHOL, HDL, LDLCALC, TRIG, CHOLHDL, LDLDIRECT in the last 72 hours. Thyroid Function Tests: No results for input(s): TSH, T4TOTAL, FREET4, T3FREE, THYROIDAB in the last 72 hours. Anemia Panel: No results for input(s): VITAMINB12, FOLATE, FERRITIN, TIBC, IRON, RETICCTPCT in the last 72 hours. Sepsis Labs: Recent Labs  Lab 02/07/17 2341 02/08/17 0631  PROCALCITON  --  0.30  LATICACIDVEN 1.90 0.8    No results found for this or any previous visit (from the past 240 hour(s)).   Radiology Studies: Ct  Abdomen Pelvis W Contrast  Result Date: 02/08/2017 CLINICAL DATA:  Acute onset of worsening generalized weakness. Nausea. Elevated LFTs. Red blood cells and white blood cells in the urine. EXAM: CT ABDOMEN AND PELVIS WITH CONTRAST TECHNIQUE: Multidetector CT imaging of the abdomen and pelvis was performed using the standard protocol following bolus administration of intravenous contrast. CONTRAST:  100 mL of Isovue 300 IV contrast COMPARISON:  CT of the abdomen and pelvis performed 05/18/2016 FINDINGS: Lower chest: There is partial consolidation of the right lower lobe, which may reflect atelectasis or pneumonia. Trace bilateral pleural fluid is noted. The visualized portions of the mediastinum are grossly unremarkable. There is diffuse enhancement of collateral vessels along the left chest wall. Hepatobiliary: The liver is unremarkable in appearance. A stone is seen dependently within the gallbladder. Gallbladder wall thickening is noted, with surrounding inflammation, raising  question for mild acute cholecystitis. The common bile duct remains normal in caliber. Pancreas: The pancreas is within normal limits. Spleen: The spleen is unremarkable in appearance. Adrenals/Urinary Tract: The adrenal glands are unremarkable in appearance. The kidneys are grossly unremarkable. There is no evidence of hydronephrosis. No renal or ureteral stones are identified. No perinephric stranding is seen. Stomach/Bowel: The stomach is unremarkable in appearance. There is herniation of multiple small bowel loops into a relatively large right-sided anterior abdominal wall hernia, without evidence for obstruction or strangulation at this time. The remaining small bowel is grossly unremarkable. The appendix is not visualized; there is no evidence for appendicitis. The colon is unremarkable in appearance. There is dilatation of the rectum to 10 cm in diameter with dense stool, raising concern for fecal impaction. Vascular/Lymphatic: The  abdominal aorta is unremarkable in appearance. The inferior vena cava is grossly unremarkable. No retroperitoneal lymphadenopathy is seen. No pelvic sidewall lymphadenopathy is identified. Reproductive: The bladder is mildly distended and grossly unremarkable. The uterus is diminutive and grossly unremarkable. No suspicious adnexal masses are seen. Other: There is diffuse enhancement of collateral vessels along the anterior abdominal wall. Musculoskeletal: No acute osseous abnormalities are identified. The patient's right hip arthroplasty is incompletely characterized, but appears grossly unremarkable. There is chronic loss of height at vertebral bodies L1, L2 and L3. The visualized musculature is unremarkable in appearance. IMPRESSION: 1. Gallbladder wall thickening, with surrounding soft tissue inflammation, raising concern for mild acute cholecystitis. Underlying cholelithiasis noted. Would correlate with the patient's symptoms. 2. Partial consolidation of the right lower lung lobe, which may reflect atelectasis or pneumonia. Trace bilateral pleural fluid noted. 3. Dilatation of the rectum to 10 cm in diameter with dense stool, raising concern for fecal impaction. 4. Herniation of multiple small bowel loops into a relatively large right-sided anterior abdominal wall hernia, without evidence for obstruction or strangulation at this time. 5. Diffuse enhancement of collateral vessels along the left chest wall and anterior abdominal wall. Electronically Signed   By: Garald Balding M.D.   On: 02/08/2017 04:15   Nm Hepato W/eject Fract  Result Date: 02/08/2017 CLINICAL DATA:  Cholelithiasis EXAM: NUCLEAR MEDICINE HEPATOBILIARY IMAGING TECHNIQUE: Sequential images of the abdomen were obtained out to 60 minutes following intravenous administration of radiopharmaceutical. RADIOPHARMACEUTICALS:  7.3 mCi Tc-52m  Choletec IV COMPARISON:  CT 05/18/2016 and today FINDINGS: Prompt uptake and excretion of radiotracer by the  liver. Activity is seen within small bowel by 20 min. There is non filling of the gallbladder at 60 min. Therefore, 3 milligrams of morphine was administered IV. Following morphine administration, there is partial filling of the gallbladder. IMPRESSION: Delayed filling of the gallbladder. Only partial/slight filling of the gallbladder after morphine administration. Findings may reflect chronic cholecystitis and possible partial obstruction of the cystic duct. Electronically Signed   By: Rolm Baptise M.D.   On: 02/08/2017 15:19   Dg Chest Portable 1 View  Result Date: 02/07/2017 CLINICAL DATA:  Acute onset of altered status. Cough and shortness of breath. EXAM: PORTABLE CHEST 1 VIEW COMPARISON:  Chest radiograph performed 11/17/2016 FINDINGS: Hazy left-sided airspace opacity may reflect asymmetric pulmonary edema or pneumonia. No definite pleural effusion or pneumothorax is seen. There is persistent marked rightward tracheal deviation, reflecting the patient's thyroid goiter. The cardiomediastinal silhouette is mildly enlarged. No acute osseous abnormalities are seen. There is chronic superior subluxation of the left humeral head. IMPRESSION: Hazy left-sided airspace opacity may reflect asymmetric pulmonary edema or pneumonia. Mild cardiomegaly. Electronically Signed  By: Garald Balding M.D.   On: 02/07/2017 23:12    Scheduled Meds: . acidophilus  1 capsule Oral Daily  . pantoprazole sodium  40 mg Oral Daily  . pregabalin  25 mg Oral BID  . propranolol  5 mg Oral BID   Continuous Infusions: . sodium chloride 75 mL/hr at 02/08/17 1528  . piperacillin-tazobactam (ZOSYN)  IV 3.375 g (02/08/17 1526)  . [START ON 02/10/2017] vancomycin       LOS: 0 days   Marylu Lund, MD [facility] Pager [phone]  If 7PM-7AM, please contact night-coverage [email or web address] Password [email or web address] 02/08/2017, 4:01 PM

## 2017-02-08 NOTE — ED Notes (Signed)
ED TO INPATIENT HANDOFF REPORT  Name/Age/Gender Sandy Barrett 83 y.o. female  Code Status Code Status History    Date Active Date Inactive Code Status Order ID Comments User Context   05/19/2016 01:38 05/26/2016 17:29 Partial Code [id number]  Sandy Kocher, MD Inpatient   04/26/2016 17:49 04/29/2016 21:20 DNR [id number]  Sandy Horn, NP Inpatient   04/20/2016 14:08 04/26/2016 17:49 Full Code [id number]  Sandy Grills, PA-C ED   03/13/2014 16:16 04/05/2014 16:27 Full Code [id number]  Sandy Leriche, PA-C Inpatient   03/13/2014 16:16 03/13/2014 16:16 Full Code [id number]  Sandy Leriche, PA-C Inpatient   03/08/2014 20:44 03/13/2014 16:16 Full Code [id number]  Sandy Barrett Inpatient   03/08/2014 20:37 03/08/2014 20:44 Full Code [id number]  Sandy Harold, NP Inpatient    Questions for Most Recent Historical Code Status (Order [id number])    Question Answer Comment   In the event of cardiac or respiratory ARREST: Initiate Code Blue, Call Rapid Response Yes    In the event of cardiac or respiratory ARREST: Perform CPR Yes    In the event of cardiac or respiratory ARREST: Perform Intubation/Mechanical Ventilation No    In the event of cardiac or respiratory ARREST: Use NIPPV/BiPAp only if indicated Yes    In the event of cardiac or respiratory ARREST: Administer ACLS medications if indicated Yes    In the event of cardiac or respiratory ARREST: Perform Defibrillation or Cardioversion if indicated Yes       Home/SNF/Other Home  Chief Complaint Emesis;Weakness  Level of Care/Admitting Diagnosis ED Disposition    ED Disposition Condition [facility]: [facility] [100102]  Level of Care: Telemetry [5]  Admit to tele based on following criteria: Monitor for Ischemic changes  Diagnosis: Nausea & vomiting [088110]  Admitting Physician: Sandy Barrett [phone]  Attending Physician: Sandy Barrett [phone]  Estimated length of stay: past  midnight tomorrow  Certification:: I certify this patient will need inpatient services for at least 2 midnights  PT Class (Do Not Modify): Inpatient [101]  PT Acc Code (Do Not Modify): Private [1]       Medical History Past Medical History:  Diagnosis Date  . ARTHRITIS   . Arthritis   . Diverticulosis   . HYPERTENSION   . HYPERTHYROIDISM   . Hyperthyroidism    s/p I-131 ablation 03/2011 of multinod goiter  . INSOMNIA   . MIGRAINE HEADACHE   . OBESITY   . Posttraumatic stress disorder   . Pulmonary embolism ([facility]) 03/2014   with DVT  . Stroke [facility]) 03/2014   dysarthria    Allergies Allergies  Allergen Reactions  . Valtrex [Valacyclovir Hcl] Cough    Cough and gagging    IV Location/Drains/Wounds Patient Lines/Drains/Airways Status   Active Line/Drains/Airways    Name:   Placement date:   Placement time:   Site:   Days:   Peripheral IV 02/07/17 Left Hand   02/07/17    2320    Hand   1   Pressure Injury 05/19/16 Stage I -  Intact skin with non-blanchable redness of a localized area usually over a bony prominence. Prior healing pressure ulcer/red, dry; patchy areas of partial thickness skin loss related to moisture associated skin damage   05/19/16    0243     265          Labs/Imaging Results for orders placed or performed during the hospital encounter of 02/07/17 (from the past  48 hour(s))  CBC with Differential     Status: Abnormal   Collection Time: 02/07/17 11:19 PM  Result Value Ref Range   WBC 12.1 (H) 4.0 - 10.5 K/uL   RBC 3.63 (L) 3.87 - 5.11 MIL/uL   Hemoglobin 13.9 12.0 - 15.0 g/dL   HCT 42.1 36.0 - 46.0 %   MCV 116.0 (H) 78.0 - 100.0 fL   MCH 38.3 (H) 26.0 - 34.0 pg   MCHC 33.0 30.0 - 36.0 g/dL   RDW 15.8 (H) 11.5 - 15.5 %   Platelets 415 (H) 150 - 400 K/uL   Neutrophils Relative % 90 %   Lymphocytes Relative 6 %   Monocytes Relative 4 %   Eosinophils Relative 0 %   Basophils Relative 0 %   Neutro Abs 10.9 (H) 1.7 - 7.7 K/uL   Lymphs Abs 0.7 0.7 -  4.0 K/uL   Monocytes Absolute 0.5 0.1 - 1.0 K/uL   Eosinophils Absolute 0.0 0.0 - 0.7 K/uL   Basophils Absolute 0.0 0.0 - 0.1 K/uL   RBC Morphology ELLIPTOCYTES     Comment: Performed at [facility], [place] [address], [place], [place] [address]  Comprehensive metabolic panel     Status: Abnormal   Collection Time: 02/07/17 11:19 PM  Result Value Ref Range   Sodium 141 135 - 145 mmol/L   Potassium 3.5 3.5 - 5.1 mmol/L   Chloride 109 101 - 111 mmol/L   CO2 27 22 - 32 mmol/L   Glucose, Bld 134 (H) 65 - 99 mg/dL   BUN 14 6 - 20 mg/dL   Creatinine, Ser 0.41 (L) 0.44 - 1.00 mg/dL   Calcium 8.8 (L) 8.9 - 10.3 mg/dL   Total Protein 6.3 (L) 6.5 - 8.1 g/dL   Albumin 3.1 (L) 3.5 - 5.0 g/dL   AST 146 (H) 15 - 41 U/L   ALT 252 (H) 14 - 54 U/L   Alkaline Phosphatase 154 (H) 38 - 126 U/L   Total Bilirubin 4.7 (H) 0.3 - 1.2 mg/dL   GFR calc non Af Amer >60 >60 mL/min   GFR calc Af Amer >60 >60 mL/min    Comment: (NOTE) The eGFR has been calculated using the CKD EPI equation. This calculation has not been validated in all clinical situations. eGFR's persistently <60 mL/min signify possible Chronic Kidney Disease.    Anion gap 5 5 - 15    Comment: Performed at [facility], [place] [address], [place], [place] [address]  Protime-INR     Status: Abnormal   Collection Time: 02/07/17 11:19 PM  Result Value Ref Range   Prothrombin Time 33.8 (H) 11.4 - 15.2 seconds   INR 3.36     Comment: Performed at [facility], [place] [address], [place],  [address]  I-Stat CG4 Lactic Acid, ED     Status: None   Collection Time: 02/07/17 11:41 PM  Result Value Ref Range   Lactic Acid, Venous 1.90 0.5 - 1.9 mmol/L  Urinalysis, Routine w reflex microscopic     Status: Abnormal   Collection Time: 02/08/17 12:15 AM  Result Value Ref Range   Color, Urine AMBER (A) YELLOW    Comment: BIOCHEMICALS MAY BE AFFECTED BY COLOR   APPearance CLEAR CLEAR    Specific Gravity, Urine 1.023 1.005 - 1.030   pH 5.0 5.0 - 8.0   Glucose, UA NEGATIVE NEGATIVE mg/dL   Hgb urine dipstick MODERATE (A) NEGATIVE   Bilirubin Urine SMALL (A)  NEGATIVE   Ketones, ur NEGATIVE NEGATIVE mg/dL   Protein, ur 30 (A) NEGATIVE mg/dL   Nitrite NEGATIVE NEGATIVE   Leukocytes, UA MODERATE (A) NEGATIVE   RBC / HPF 6-30 0 - 5 RBC/hpf   WBC, UA 6-30 0 - 5 WBC/hpf   Bacteria, UA NONE SEEN NONE SEEN   Squamous Epithelial / LPF 0-5 (A) NONE SEEN   Mucus PRESENT     Comment: Performed at [facility], [place] [address], [place], [place] [address]   *Note: Due to a large number of results and/or encounters for the requested time period, some results have not been displayed. A complete set of results can be found in Results Review.   Dg Chest Portable 1 View  Result Date: 02/07/2017 CLINICAL DATA:  Acute onset of altered status. Cough and shortness of breath. EXAM: PORTABLE CHEST 1 VIEW COMPARISON:  Chest radiograph performed 11/17/2016 FINDINGS: Hazy left-sided airspace opacity may reflect asymmetric pulmonary edema or pneumonia. No definite pleural effusion or pneumothorax is seen. There is persistent marked rightward tracheal deviation, reflecting the patient's thyroid goiter. The cardiomediastinal silhouette is mildly enlarged. No acute osseous abnormalities are seen. There is chronic superior subluxation of the left humeral head. IMPRESSION: Hazy left-sided airspace opacity may reflect asymmetric pulmonary edema or pneumonia. Mild cardiomegaly. Electronically Signed   By: Garald Balding M.D.   On: 02/07/2017 23:12    Pending Labs Unresulted Labs (From admission, onward)   None      Vitals/Pain Today's Vitals   02/08/17 0130 02/08/17 0200 02/08/17 0230 02/08/17 0300  BP: 108/86 139/85 117/73 118/71  Pulse: 98 (!) 105 (!) 103 (!) 107  Resp:      Temp:      TempSrc:      SpO2: 94% 94% 93% 93%  Weight:      Height:        Isolation Precautions No  active isolations  Medications Medications  sodium chloride 0.9 % bolus 500 mL (0 mLs Intravenous Stopped 02/08/17 0031)  cefTRIAXone (ROCEPHIN) 1 g in dextrose 5 % 50 mL IVPB (0 g Intravenous Stopped 02/08/17 0138)    Mobility non-ambulatory

## 2017-02-08 NOTE — Consult Note (Signed)
[facility] Consult Note  [name] 1932-04-02  [id number].    Requesting MD: Hal Hope Chief Complaint/Reason for Consult: Acute cholecystitis HPI:  Patient is a 84 y/o female who presented to [facility] with nausea and vomiting this AM. 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 Patient also has a history of HTN and hyperthyroidsim. Patient is aphasic and hemiplegic on the right side after her stroke. Her daughter is present at the bedside and assisted in providing the history. Patient had 2 episodes of emesis in the last 24 h, productive of what appeared to be "mucus". She was eating and drinking her normal amounts but seemed less active. Patient's daughter does not feel like she has been having abdominal pain, not clutching at right side or moaning. No fever/chills at home. Patient normally has a BM daily. Patient's daughter does note that RUE has become more swollen in the last week.   ROS: Review of Systems  Reason unable to perform ROS: ROS limited, patient aphasic.  Constitutional: Negative for chills and fever.  Respiratory: Negative for cough and shortness of breath.   Gastrointestinal: Positive for abdominal pain, nausea and vomiting. Negative for constipation and diarrhea.  Musculoskeletal:       RUE swelling    Family History  Problem Relation Age of Onset  . Asthma Mother   . Asthma Father   . Prostate cancer Father   . Stroke Brother   . Breast cancer Sister   . Stomach cancer Sister   . Thyroid disease Neg Hx     Past Medical History:  Diagnosis Date  . ARTHRITIS   . Arthritis   . Diverticulosis   . HYPERTENSION   . HYPERTHYROIDISM   . Hyperthyroidism    s/p I-131 ablation 03/2011 of multinod goiter  . INSOMNIA   . MIGRAINE HEADACHE   . OBESITY   . Posttraumatic stress disorder   . Pulmonary embolism ([place]) 03/2014   with DVT  . Stroke [facility]) 03/2014   dysarthria    Past Surgical History:  Procedure Laterality  Date  . ABDOMINAL HYSTERECTOMY  1976  . BREAST SURGERY     biopsy  . RADIOLOGY WITH ANESTHESIA Left 03/08/2014   Procedure: RADIOLOGY WITH ANESTHESIA;  Surgeon: Rob Hickman, MD;  Location: [facility];  Service: Radiology;  Laterality: Left;  . TONSILLECTOMY    . TOTAL HIP ARTHROPLASTY  1998    right    Social History:  reports that  has never smoked. she has never used smokeless tobacco. She reports that she does not drink alcohol or use drugs.  Allergies:  Allergies  Allergen Reactions  . Valtrex [Valacyclovir Hcl] Cough    Cough and gagging    Medications Prior to Admission  Medication Sig Dispense Refill  . acetaminophen (TYLENOL) 160 MG/5ML liquid Take 480 mg by mouth every 4 (four) hours as needed for fever or pain.    . calamine lotion Apply 1 application as needed topically for itching. 120 mL 0  . diclofenac sodium (VOLTAREN) 1 % GEL APPLY (2GMS) TOPICALLY THREE TIMES DAILY. 300 g 5  . hydroxyurea (HYDREA) 500 MG capsule Take 1 tablet (500 mg) Monday through Friday, take 2 tablets ( 1000 mg ) Saturday and Sunday 90 capsule 3  . ipratropium-albuterol (DUONEB) 0.5-2.5 (3) MG/3ML SOLN Take 3 mLs by nebulization every 6 (six) hours as needed. (Patient taking differently: Take 3 mLs by nebulization every 6 (six) hours as needed (shortness of breath). ) 360 mL  0  . lidocaine (LIDODERM) 5 % Place 1 patch daily onto the skin. Remove & Discard patch within 12 hours or as directed by MD (Patient taking differently: Place 1 patch onto the skin daily as needed (pain). Remove & Discard patch within 12 hours or as directed by MD) 15 patch 0  . methimazole (TAPAZOLE) 5 MG tablet TAKE 1 TABLET BY MOUTH TWICE WEEKLY 12 tablet 1  . polyethylene glycol (MIRALAX / GLYCOLAX) packet TAKE 17G BY MOUTH TWICE A DAY (Patient taking differently: TAKE 17G BY MOUTH TWICE A DAY AS NEED FOR CONSTIPATION) 14 packet 1  . pregabalin (LYRICA) 25 MG capsule Take 1 capsule (25 mg total) by mouth 2 (two) times  daily. 60 capsule 3  . Probiotic Product (ALIGN) 4 MG CAPS Take 4 mg by mouth daily.     . propranolol (INDERAL) 10 MG tablet Take 0.5 tablets (5 mg total) by mouth 2 (two) times daily. 60 tablet 5  . PROTONIX 40 MG PACK TAKE 20 MLS (40 MG TOTAL) BY MOUTH DAILY. 30 each 5  . terconazole (TERAZOL 7) 0.4 % vaginal cream Place 1 applicator vaginally at bedtime. (Patient taking differently: Place 1 applicator vaginally at bedtime as needed (irritation). ) 90 g 3  . traMADol (ULTRAM) 50 MG tablet TAKE 2 TABLETS BY MOUTH 3 TIMES A DAY AS NEEDED (Patient taking differently: TAKE 100 MG BY MOUTH 3 TIMES A DAY AS NEEDED FOR PAIN) 180 tablet 1  . traZODone (DESYREL) 50 MG tablet TAKE 1 TABLET (50 MG TOTAL) BY MOUTH AT BEDTIME AS NEEDED. FOR SLEEP 30 tablet 5  . triamcinolone ointment (KENALOG) 0.5 % Apply 1 application topically 2 (two) times daily. (Patient taking differently: Apply 1 application topically 2 (two) times daily as needed (irritation). ) 30 g 0  . warfarin (COUMADIN) 2 MG tablet Take as directed by anticoagulation clinic. (Patient taking differently: Take 2 mg by mouth See admin instructions. Take 2 mg every tues,wed, thurs, sat, & sun) 30 tablet 3  . warfarin (COUMADIN) 4 MG tablet TAKE AS DIRECTED BY ANTICOAGULATION CLINIC. (Patient taking differently: Take 4 mg by mouth every Friday and  Monday) 40 tablet 3  . albuterol (PROVENTIL HFA) 108 (90 Base) MCG/ACT inhaler Inhale 2 puffs into the lungs every 6 (six) hours as needed for wheezing or shortness of breath. (Patient not taking: Reported on 02/08/2017) 1 Inhaler 1  . nystatin (MYCOSTATIN/NYSTOP) powder Apply topically 3 (three) times daily. (Patient not taking: Reported on 02/08/2017) 30 g 0    Blood pressure 130/77, pulse 100, temperature 97.7 F (36.5 C), temperature source Oral, resp. rate 18, height 5' (1.524 m), weight 75.6 kg (166 lb 9.6 oz), SpO2 98 %. Physical Exam: Physical Exam  Constitutional: She appears well-developed and  well-nourished. She is cooperative.  Non-toxic appearance. No distress. Nasal cannula in place.  HENT:  Head: Normocephalic and atraumatic.  Right Ear: External ear normal.  Left Ear: External ear normal.  Nose: Nose normal.  Mouth/Throat: Oropharynx is clear and moist and mucous membranes are normal.  Eyes: Conjunctivae and lids are normal. Pupils are equal, round, and reactive to light. No scleral icterus.  Neck: Neck supple.  Cardiovascular: Normal rate and regular rhythm.  Pulses:      Radial pulses are 2+ on the right side, and 2+ on the left side.       Dorsalis pedis pulses are 2+ on the right side, and 2+ on the left side.  Swelling in RUE  Pulmonary/Chest: Effort  normal. She has decreased breath sounds in the right lower field. She has no wheezes. She has no rhonchi. She has no rales.  Abdominal: Soft. Bowel sounds are normal. She exhibits no distension. There is no hepatosplenomegaly. There is tenderness (patient winces to palpation of right abdomen). There is no rigidity, no rebound, no guarding and negative Murphy's sign. A hernia (no signs of incarceration or strangulation ) is present.  Genitourinary:  Genitourinary Comments: Wick device present  Musculoskeletal:  R hemiplegia. ROM grossly intact in LUE and LLE  Neurological: She is alert.  aphasic  Skin: Skin is warm, dry and intact.  Psychiatric:  Unable to assess     Results for orders placed or performed during the hospital encounter of 02/07/17 (from the past 48 hour(s))  CBC with Differential     Status: Abnormal   Collection Time: 02/07/17 11:19 PM  Result Value Ref Range   WBC 12.1 (H) 4.0 - 10.5 K/uL   RBC 3.63 (L) 3.87 - 5.11 MIL/uL   Hemoglobin 13.9 12.0 - 15.0 g/dL   HCT 42.1 36.0 - 46.0 %   MCV 116.0 (H) 78.0 - 100.0 fL   MCH 38.3 (H) 26.0 - 34.0 pg   MCHC 33.0 30.0 - 36.0 g/dL   RDW 15.8 (H) 11.5 - 15.5 %   Platelets 415 (H) 150 - 400 K/uL   Neutrophils Relative % 90 %   Lymphocytes Relative 6 %    Monocytes Relative 4 %   Eosinophils Relative 0 %   Basophils Relative 0 %   Neutro Abs 10.9 (H) 1.7 - 7.7 K/uL   Lymphs Abs 0.7 0.7 - 4.0 K/uL   Monocytes Absolute 0.5 0.1 - 1.0 K/uL   Eosinophils Absolute 0.0 0.0 - 0.7 K/uL   Basophils Absolute 0.0 0.0 - 0.1 K/uL   RBC Morphology ELLIPTOCYTES     Comment: Performed at [facility], [place] [address] [place], [place] [address]  Comprehensive metabolic panel     Status: Abnormal   Collection Time: 02/07/17 11:19 PM  Result Value Ref Range   Sodium 141 135 - 145 mmol/L   Potassium 3.5 3.5 - 5.1 mmol/L   Chloride 109 101 - 111 mmol/L   CO2 27 22 - 32 mmol/L   Glucose, Bld 134 (H) 65 - 99 mg/dL   BUN 14 6 - 20 mg/dL   Creatinine, Ser 0.41 (L) 0.44 - 1.00 mg/dL   Calcium 8.8 (L) 8.9 - 10.3 mg/dL   Total Protein 6.3 (L) 6.5 - 8.1 g/dL   Albumin 3.1 (L) 3.5 - 5.0 g/dL   AST 146 (H) 15 - 41 U/L   ALT 252 (H) 14 - 54 U/L   Alkaline Phosphatase 154 (H) 38 - 126 U/L   Total Bilirubin 4.7 (H) 0.3 - 1.2 mg/dL   GFR calc non Af Amer >60 >60 mL/min   GFR calc Af Amer >60 >60 mL/min    Comment: (NOTE) The eGFR has been calculated using the CKD EPI equation. This calculation has not been validated in all clinical situations. eGFR's persistently <60 mL/min signify possible Chronic Kidney Disease.    Anion gap 5 5 - 15    Comment: Performed at [facility], [place] [address] [place], [place] [address]  Protime-INR     Status: Abnormal   Collection Time: 02/07/17 11:19 PM  Result Value Ref Range   Prothrombin Time 33.8 (H) 11.4 - 15.2 seconds   INR 3.36     Comment:  Performed at [facility], [place] [address], [place], [place] [address]  I-Stat CG4 Lactic Acid, ED     Status: None   Collection Time: 02/07/17 11:41 PM  Result Value Ref Range   Lactic Acid, Venous 1.90 0.5 - 1.9 mmol/L  Urinalysis, Routine w reflex microscopic     Status: Abnormal   Collection Time: 02/08/17 12:15 AM   Result Value Ref Range   Color, Urine AMBER (A) YELLOW    Comment: BIOCHEMICALS MAY BE AFFECTED BY COLOR   APPearance CLEAR CLEAR   Specific Gravity, Urine 1.023 1.005 - 1.030   pH 5.0 5.0 - 8.0   Glucose, UA NEGATIVE NEGATIVE mg/dL   Hgb urine dipstick MODERATE (A) NEGATIVE   Bilirubin Urine SMALL (A) NEGATIVE   Ketones, ur NEGATIVE NEGATIVE mg/dL   Protein, ur 30 (A) NEGATIVE mg/dL   Nitrite NEGATIVE NEGATIVE   Leukocytes, UA MODERATE (A) NEGATIVE   RBC / HPF 6-30 0 - 5 RBC/hpf   WBC, UA 6-30 0 - 5 WBC/hpf   Bacteria, UA NONE SEEN NONE SEEN   Squamous Epithelial / LPF 0-5 (A) NONE SEEN   Mucus PRESENT     Comment: Performed at [facility], [place] [address], [place], [place] [address]  CBC with Differential     Status: Abnormal   Collection Time: 02/08/17  6:31 AM  Result Value Ref Range   WBC 11.4 (H) 4.0 - 10.5 K/uL   RBC 3.35 (L) 3.87 - 5.11 MIL/uL   Hemoglobin 12.8 12.0 - 15.0 g/dL   HCT 38.7 36.0 - 46.0 %   MCV 115.5 (H) 78.0 - 100.0 fL   MCH 38.2 (H) 26.0 - 34.0 pg   MCHC 33.1 30.0 - 36.0 g/dL   RDW 16.0 (H) 11.5 - 15.5 %   Platelets 392 150 - 400 K/uL   Neutrophils Relative % 88 %   Lymphocytes Relative 6 %   Monocytes Relative 6 %   Eosinophils Relative 0 %   Basophils Relative 0 %   Neutro Abs 10.0 (H) 1.7 - 7.7 K/uL   Lymphs Abs 0.7 0.7 - 4.0 K/uL   Monocytes Absolute 0.7 0.1 - 1.0 K/uL   Eosinophils Absolute 0.0 0.0 - 0.7 K/uL   Basophils Absolute 0.0 0.0 - 0.1 K/uL   Smear Review MORPHOLOGY UNREMARKABLE     Comment: Performed at [facility], [place] [address] [place], [place] [address]  Comprehensive metabolic panel     Status: Abnormal   Collection Time: 02/08/17  6:31 AM  Result Value Ref Range   Sodium 141 135 - 145 mmol/L   Potassium 3.6 3.5 - 5.1 mmol/L   Chloride 110 101 - 111 mmol/L   CO2 24 22 - 32 mmol/L   Glucose, Bld 119 (H) 65 - 99 mg/dL   BUN 13 6 - 20 mg/dL   Creatinine, Ser 0.35 (L) 0.44 - 1.00 mg/dL    Calcium 8.5 (L) 8.9 - 10.3 mg/dL   Total Protein 5.8 (L) 6.5 - 8.1 g/dL   Albumin 2.9 (L) 3.5 - 5.0 g/dL   AST 92 (H) 15 - 41 U/L   ALT 198 (H) 14 - 54 U/L   Alkaline Phosphatase 129 (H) 38 - 126 U/L   Total Bilirubin 2.8 (H) 0.3 - 1.2 mg/dL   GFR calc non Af Amer >60 >60 mL/min   GFR calc Af Amer >60 >60 mL/min    Comment: (NOTE) The eGFR has been calculated using the  CKD EPI equation. This calculation has not been validated in all clinical situations. eGFR's persistently <60 mL/min signify possible Chronic Kidney Disease.    Anion gap 7 5 - 15    Comment: Performed at [facility], [place] [address] [place], [place] [address]  Lactic acid, plasma     Status: None   Collection Time: 02/08/17  6:31 AM  Result Value Ref Range   Lactic Acid, Venous 0.8 0.5 - 1.9 mmol/L    Comment: Performed at [facility], [place] [address], [place], [place] [address]  Procalcitonin     Status: None   Collection Time: 02/08/17  6:31 AM  Result Value Ref Range   Procalcitonin 0.30 ng/mL    Comment:        Interpretation: PCT (Procalcitonin) <= 0.5 ng/mL: Systemic infection (sepsis) is not likely. Local bacterial infection is possible. (NOTE)       Sepsis PCT Algorithm           Lower Respiratory Tract                                      Infection PCT Algorithm    ----------------------------     ----------------------------         PCT < 0.25 ng/mL                PCT < 0.10 ng/mL         Strongly encourage             Strongly discourage   discontinuation of antibiotics    initiation of antibiotics    ----------------------------     -----------------------------       PCT 0.25 - 0.50 ng/mL            PCT 0.10 - 0.25 ng/mL               OR       >80% decrease in PCT            Discourage initiation of                                            antibiotics      Encourage discontinuation           of antibiotics    ----------------------------      -----------------------------         PCT >= 0.50 ng/mL              PCT 0.26 - 0.50 ng/mL               AND        <80% decrease in PCT             Encourage initiation of                                             antibiotics       Encourage continuation           of antibiotics    ----------------------------     -----------------------------        PCT >= 0.50 ng/mL  PCT > 0.50 ng/mL               AND         increase in PCT                  Strongly encourage                                      initiation of antibiotics    Strongly encourage escalation           of antibiotics                                     -----------------------------                                           PCT <= 0.25 ng/mL                                                 OR                                        > 80% decrease in PCT                                     Discontinue / Do not initiate                                             antibiotics Performed at [place] [address], [place], [place] [address]   APTT     Status: Abnormal   Collection Time: 02/08/17  6:31 AM  Result Value Ref Range   aPTT 49 (H) 24 - 36 seconds    Comment:        IF BASELINE aPTT IS ELEVATED, SUGGEST PATIENT RISK ASSESSMENT BE USED TO DETERMINE APPROPRIATE ANTICOAGULANT THERAPY. Performed at [facility], [place] [address], [place], [place] [address]   Hepatic function panel     Status: Abnormal   Collection Time: 02/08/17  6:31 AM  Result Value Ref Range   Total Protein 5.7 (L) 6.5 - 8.1 g/dL   Albumin 2.8 (L) 3.5 - 5.0 g/dL   AST 87 (H) 15 - 41 U/L   ALT 197 (H) 14 - 54 U/L   Alkaline Phosphatase 131 (H) 38 - 126 U/L   Total Bilirubin 2.9 (H) 0.3 - 1.2 mg/dL   Bilirubin, Direct 1.7 (H) 0.1 - 0.5 mg/dL   Indirect Bilirubin 1.2 (H) 0.3 - 0.9 mg/dL    Comment: Performed at [facility], [place] [address], [place], [place]  [address]  Influenza panel by PCR (type A & B)     Status: None   Collection Time: 02/08/17  7:20 AM  Result Value Ref Range  Influenza A By PCR NEGATIVE NEGATIVE   Influenza B By PCR NEGATIVE NEGATIVE    Comment: (NOTE) The Xpert Xpress Flu assay is intended as an aid in the diagnosis of  influenza and should not be used as a sole basis for treatment.  This  assay is FDA approved for nasopharyngeal swab specimens only. Nasal  washings and aspirates are unacceptable for Xpert Xpress Flu testing. Performed at [facility], [place] [address], [place], [place] [address]   Glucose, capillary     Status: Abnormal   Collection Time: 02/08/17  7:38 AM  Result Value Ref Range   Glucose-Capillary 144 (H) 65 - 99 mg/dL   *Note: Due to a large number of results and/or encounters for the requested time period, some results have not been displayed. A complete set of results can be found in Results Review.   Ct Abdomen Pelvis W Contrast  Result Date: 02/08/2017 CLINICAL DATA:  Acute onset of worsening generalized weakness. Nausea. Elevated LFTs. Red blood cells and white blood cells in the urine. EXAM: CT ABDOMEN AND PELVIS WITH CONTRAST TECHNIQUE: Multidetector CT imaging of the abdomen and pelvis was performed using the standard protocol following bolus administration of intravenous contrast. CONTRAST:  100 mL of Isovue 300 IV contrast COMPARISON:  CT of the abdomen and pelvis performed 05/18/2016 FINDINGS: Lower chest: There is partial consolidation of the right lower lobe, which may reflect atelectasis or pneumonia. Trace bilateral pleural fluid is noted. The visualized portions of the mediastinum are grossly unremarkable. There is diffuse enhancement of collateral vessels along the left chest wall. Hepatobiliary: The liver is unremarkable in appearance. A stone is seen dependently within the gallbladder. Gallbladder wall thickening is noted, with surrounding inflammation, raising question  for mild acute cholecystitis. The common bile duct remains normal in caliber. Pancreas: The pancreas is within normal limits. Spleen: The spleen is unremarkable in appearance. Adrenals/Urinary Tract: The adrenal glands are unremarkable in appearance. The kidneys are grossly unremarkable. There is no evidence of hydronephrosis. No renal or ureteral stones are identified. No perinephric stranding is seen. Stomach/Bowel: The stomach is unremarkable in appearance. There is herniation of multiple small bowel loops into a relatively large right-sided anterior abdominal wall hernia, without evidence for obstruction or strangulation at this time. The remaining small bowel is grossly unremarkable. The appendix is not visualized; there is no evidence for appendicitis. The colon is unremarkable in appearance. There is dilatation of the rectum to 10 cm in diameter with dense stool, raising concern for fecal impaction. Vascular/Lymphatic: The abdominal aorta is unremarkable in appearance. The inferior vena cava is grossly unremarkable. No retroperitoneal lymphadenopathy is seen. No pelvic sidewall lymphadenopathy is identified. Reproductive: The bladder is mildly distended and grossly unremarkable. The uterus is diminutive and grossly unremarkable. No suspicious adnexal masses are seen. Other: There is diffuse enhancement of collateral vessels along the anterior abdominal wall. Musculoskeletal: No acute osseous abnormalities are identified. The patient's right hip arthroplasty is incompletely characterized, but appears grossly unremarkable. There is chronic loss of height at vertebral bodies L1, L2 and L3. The visualized musculature is unremarkable in appearance. IMPRESSION: 1. Gallbladder wall thickening, with surrounding soft tissue inflammation, raising concern for mild acute cholecystitis. Underlying cholelithiasis noted. Would correlate with the patient's symptoms. 2. Partial consolidation of the right lower lung lobe,  which may reflect atelectasis or pneumonia. Trace bilateral pleural fluid noted. 3. Dilatation of the rectum to 10 cm in diameter with dense stool, raising concern for fecal impaction. 4. Herniation  of multiple small bowel loops into a relatively large right-sided anterior abdominal wall hernia, without evidence for obstruction or strangulation at this time. 5. Diffuse enhancement of collateral vessels along the left chest wall and anterior abdominal wall. Electronically Signed   By: Garald Balding M.D.   On: 02/08/2017 04:15   Dg Chest Portable 1 View  Result Date: 02/07/2017 CLINICAL DATA:  Acute onset of altered status. Cough and shortness of breath. EXAM: PORTABLE CHEST 1 VIEW COMPARISON:  Chest radiograph performed 11/17/2016 FINDINGS: Hazy left-sided airspace opacity may reflect asymmetric pulmonary edema or pneumonia. No definite pleural effusion or pneumothorax is seen. There is persistent marked rightward tracheal deviation, reflecting the patient's thyroid goiter. The cardiomediastinal silhouette is mildly enlarged. No acute osseous abnormalities are seen. There is chronic superior subluxation of the left humeral head. IMPRESSION: Hazy left-sided airspace opacity may reflect asymmetric pulmonary edema or pneumonia. Mild cardiomegaly. Electronically Signed   By: Garald Balding M.D.   On: 02/07/2017 23:12      Assessment/Plan Hyperthyroidism HTN Hx of embolic stroke - patient with R hemiplegia and aphasia Hx of PE Essential thrombocytosis - on hydroxyurea CAP - RLL, per primary service RUE swelling - DVT unlikely given anticoagulation but will order [place]  Nausea and vomiting Elevated LFTs- AST 87, ALT 197, Tbili 2.9; trend LFTs, will consult [facility] Possible acute cholecystitis - CT 2/4: gallbladder wall thickening with some surrounding inflammation, concerning for mild acute cholecystitis; cholelithiasis - patient wincing on palpation of R abdomen but negative Murphy's sign, unclear  whether this is truly acute cholecystitis - HIDA ordered  - INR 3.36 - patient is not an ideal surgical candidate, may need to consider percutaneous drainage if HIDA positive R anterior abdominal wall hernia - no signs of strangulation or incarceration  Fecal impaction - enema  FEN: NPO, IVF VTE: SCDs, heparin gtt ID: IV zithromax 2/4>>; IV rocephin x1; IV vanc 2/4>>; IV Zosyn 2/4>>  Brigid Re, [facility] 02/08/2017, 10:11 AM Pager: [phone] Consults: [phone] Mon-Fri 7:00 am-4:30 pm Sat-Sun 7:00 am-11:30 am

## 2017-02-08 NOTE — Progress Notes (Signed)
MD spoke with the patient's daughter over the phone regarding possible treatments that may be ordered for the patient.  Patient's daughter had many questions regarding the patient's projected care.

## 2017-02-08 NOTE — Progress Notes (Signed)
ANTICOAGULATION CONSULT NOTE - Initial Consult  Pharmacy Consult for Heparin Indication: Hx of VTE, CVA; PTA warfarin held  Allergies  Allergen Reactions  . Valtrex [Valacyclovir Hcl] Cough    Cough and gagging    Patient Measurements: Height: 5' (152.4 cm) Weight: 166 lb 9.6 oz (75.6 kg) IBW/kg (Calculated) : 45.5 Heparin Dosing Weight: 62.5  Vital Signs: Temp: 97.7 F (36.5 C) (02/04 0441) Temp Source: Oral (02/04 0441) BP: 130/77 (02/04 0441) Pulse Rate: 100 (02/04 0441)  Labs: Recent Labs    02/07/17 2319 02/08/17 0631  HGB 13.9 12.8  HCT 42.1 38.7  PLT 415* 392  APTT  --  49*  LABPROT 33.8* 32.9*  INR 3.36 3.25  CREATININE 0.41* 0.35*    Estimated Creatinine Clearance: 49.2 mL/min (A) (by C-G formula based on SCr of 0.35 mg/dL (L)).   Medical History: Past Medical History:  Diagnosis Date  . ARTHRITIS   . Arthritis   . Diverticulosis   . HYPERTENSION   . HYPERTHYROIDISM   . Hyperthyroidism    s/p I-131 ablation 03/2011 of multinod goiter  . INSOMNIA   . MIGRAINE HEADACHE   . OBESITY   . Posttraumatic stress disorder   . Pulmonary embolism ([place]) 03/2014   with DVT  . Stroke [facility]) 03/2014   dysarthria    Medications:  Scheduled:  . acidophilus  1 capsule Oral Daily  . pantoprazole sodium  40 mg Oral Daily  . pregabalin  25 mg Oral BID  . propranolol  5 mg Oral BID   Infusions:  . sodium chloride Stopped (02/08/17 1100)  . piperacillin-tazobactam (ZOSYN)  IV Stopped (02/08/17 1309)  . [START ON 02/10/2017] vancomycin      Assessment: 28 yoF admitted on 02/08/17 with n/v, pneumonia, acute cholecystitis.  PMH is significant for PE, DVT, and CVA on chronic warfarin.  Pharmacy is consulted to dose heparin while warfarin is held pending surgical workup.  PTA warfarin dose was 2mg  daily except 4mg  on  Monday/Friday.  Last dose noted on 2/2 at 2100. Admission INR 3.36 is supratherapeutic.   Today, 02/08/2017:  INR 3.25, remains  supratherapeutic  CBC: Hgb and Plt WNL/stable  No bleeding or complications reported.  Goal of Therapy:  Heparin level 0.3-0.7 units/ml Monitor platelets by anticoagulation protocol: Yes   Plan:   Continue to hold heparin infusion until INR < 2  Daily INR  Follow up surgical plans / timing.  Gretta Arab PharmD, BCPS Pager [phone] 02/08/2017 1:24 PM

## 2017-02-08 NOTE — Progress Notes (Signed)
ANTICOAGULATION CONSULT NOTE - Initial Consult  Pharmacy Consult for heparin Indication: When INR < 2, hx of PE, CVA  Allergies  Allergen Reactions  . Valtrex [Valacyclovir Hcl] Cough    Cough and gagging    Patient Measurements: Height: 5' (152.4 cm) Weight: 166 lb 9.6 oz (75.6 kg) IBW/kg (Calculated) : 45.5 Heparin Dosing Weight:   Vital Signs: Temp: (P) 97.7 F (36.5 C) (02/04 0441) Temp Source: (P) Oral (02/04 0441) BP: (P) 130/77 (02/04 0441) Pulse Rate: (P) 100 (02/04 0441)  Labs: Recent Labs    02/07/17 2319  HGB 13.9  HCT 42.1  PLT 415*  LABPROT 33.8*  INR 3.36  CREATININE 0.41*    Estimated Creatinine Clearance: 49.2 mL/min (A) (by C-G formula based on SCr of 0.41 mg/dL (L)).   Medical History: Past Medical History:  Diagnosis Date  . ARTHRITIS   . Arthritis   . Diverticulosis   . HYPERTENSION   . HYPERTHYROIDISM   . Hyperthyroidism    s/p I-131 ablation 03/2011 of multinod goiter  . INSOMNIA   . MIGRAINE HEADACHE   . OBESITY   . Posttraumatic stress disorder   . Pulmonary embolism ([place]) 03/2014   with DVT  . Stroke [facility]) 03/2014   dysarthria    Medications:  Medications Prior to Admission  Medication Sig Dispense Refill Last Dose  . acetaminophen (TYLENOL) 160 MG/5ML liquid Take 480 mg by mouth every 4 (four) hours as needed for fever or pain.   UNK  . calamine lotion Apply 1 application as needed topically for itching. 120 mL 0 unk  . diclofenac sodium (VOLTAREN) 1 % GEL APPLY (2GMS) TOPICALLY THREE TIMES DAILY. 300 g 5 02/07/2017 at Unknown time  . hydroxyurea (HYDREA) 500 MG capsule Take 1 tablet (500 mg) Monday through Friday, take 2 tablets ( 1000 mg ) Saturday and Sunday 90 capsule 3 02/07/2017 at Unknown time  . ipratropium-albuterol (DUONEB) 0.5-2.5 (3) MG/3ML SOLN Take 3 mLs by nebulization every 6 (six) hours as needed. (Patient taking differently: Take 3 mLs by nebulization every 6 (six) hours as needed (shortness of breath). ) 360 mL 0  unk  . lidocaine (LIDODERM) 5 % Place 1 patch daily onto the skin. Remove & Discard patch within 12 hours or as directed by MD (Patient taking differently: Place 1 patch onto the skin daily as needed (pain). Remove & Discard patch within 12 hours or as directed by MD) 15 patch 0 Past Week at Unknown time  . methimazole (TAPAZOLE) 5 MG tablet TAKE 1 TABLET BY MOUTH TWICE WEEKLY 12 tablet 1 02/04/2017  . polyethylene glycol (MIRALAX / GLYCOLAX) packet TAKE 17G BY MOUTH TWICE A DAY (Patient taking differently: TAKE 17G BY MOUTH TWICE A DAY AS NEED FOR CONSTIPATION) 14 packet 1 unk  . pregabalin (LYRICA) 25 MG capsule Take 1 capsule (25 mg total) by mouth 2 (two) times daily. 60 capsule 3 02/07/2017 at Unknown time  . Probiotic Product (ALIGN) 4 MG CAPS Take 4 mg by mouth daily.    02/07/2017 at Unknown time  . propranolol (INDERAL) 10 MG tablet Take 0.5 tablets (5 mg total) by mouth 2 (two) times daily. 60 tablet 5 02/07/2017 at 1100  . PROTONIX 40 MG PACK TAKE 20 MLS (40 MG TOTAL) BY MOUTH DAILY. 30 each 5 02/07/2017 at 1100  . terconazole (TERAZOL 7) 0.4 % vaginal cream Place 1 applicator vaginally at bedtime. (Patient taking differently: Place 1 applicator vaginally at bedtime as needed (irritation). ) 90 g 3  Past Week at Unknown time  . traMADol (ULTRAM) 50 MG tablet TAKE 2 TABLETS BY MOUTH 3 TIMES A DAY AS NEEDED (Patient taking differently: TAKE 100 MG BY MOUTH 3 TIMES A DAY AS NEEDED FOR PAIN) 180 tablet 1 02/07/2017 at 1100  . traZODone (DESYREL) 50 MG tablet TAKE 1 TABLET (50 MG TOTAL) BY MOUTH AT BEDTIME AS NEEDED. FOR SLEEP 30 tablet 5 02/06/2017 at Unknown time  . triamcinolone ointment (KENALOG) 0.5 % Apply 1 application topically 2 (two) times daily. (Patient taking differently: Apply 1 application topically 2 (two) times daily as needed (irritation). ) 30 g 0 unk  . warfarin (COUMADIN) 2 MG tablet Take as directed by anticoagulation clinic. (Patient taking differently: Take 2 mg by mouth See admin  instructions. Take 2 mg every tues,wed, thurs, sat, & sun) 30 tablet 3 02/06/2017 at 2100  . warfarin (COUMADIN) 4 MG tablet TAKE AS DIRECTED BY ANTICOAGULATION CLINIC. (Patient taking differently: Take 4 mg by mouth every Friday and  Monday) 40 tablet 3 02/05/2017 at 2100  . albuterol (PROVENTIL HFA) 108 (90 Base) MCG/ACT inhaler Inhale 2 puffs into the lungs every 6 (six) hours as needed for wheezing or shortness of breath. (Patient not taking: Reported on 02/08/2017) 1 Inhaler 1 Not Taking at Unknown time  . nystatin (MYCOSTATIN/NYSTOP) powder Apply topically 3 (three) times daily. (Patient not taking: Reported on 02/08/2017) 30 g 0 Completed Course at Unknown time    Assessment: Patient on warfarin prior to admission with INR 3.36.  Will repeat INR for 2/4 at 1000 then daily going forward. Warfarin dose noted to be 2mg  daily except 4mg  on  Monday/Friday.  Last dose noted on 2/2 at 2100.  Goal of Therapy:  INR 2-3    Plan:  Follow up INR Dose heparin as needed.  Tyler Deis, Shea Stakes Crowford 02/08/2017,5:06 AM

## 2017-02-08 NOTE — ED Notes (Addendum)
This RN received a verbal order from Charlann Lange, PA-C for NTS to be done by Respiratory.

## 2017-02-08 NOTE — Progress Notes (Signed)
Request received for percutaneous cholecystostomy in patient.  Latest imaging studies were reviewed by Dr. Vernard Gambles.  Gallbladder does not appear distended and there is partial filling of gallbladder on nuclear medicine scan, therefore patient not appropriate candidate per Dr. Vernard Gambles [phone]).

## 2017-02-08 NOTE — ED Notes (Signed)
Lab contacted about urine. They stated it was being received now.

## 2017-02-08 NOTE — H&P (Signed)
History and Physical    Sandy Barrett OAC:[id number] DOB: March 17, 1932 DOA: 02/07/2017  PCP: [name], MD  Patient coming from: Home.  Chief Complaint: Nausea vomiting.  HPI: Sandy Barrett is a 84 y.o. female with history of stroke with right-sided hemiplegia and aphasia, PE, hyperthyroidism, essential thrombocytosis was brought to the ER patient's daughter found that patient had thrown up twice yesterday.  Was the time patient threw up mucoid.  Patient is aphasic and does not complain but did not notice any chest pain or shortness of breath or any abdominal pain.  Has not had any diarrhea.  No fever or chills.  Only medication change was Lyrica from gabapentin.  ED Course: In the ER patient's labs reveal elevated LFTs with chest x-ray showing possible infiltrates concerning for pneumonia.  CT abdomen pelvis done shows possibility of acute cholecystitis and consolidation in the lung.  Blood cultures obtained and started on empiric antibiotics.  Patient admitted for SIRS secondary to pneumonia and possible cholecystitis.  On exam abdomen appears benign.  Review of Systems: As per HPI, rest all negative.   Past Medical History:  Diagnosis Date  . ARTHRITIS   . Arthritis   . Diverticulosis   . HYPERTENSION   . HYPERTHYROIDISM   . Hyperthyroidism    s/p I-131 ablation 03/2011 of multinod goiter  . INSOMNIA   . MIGRAINE HEADACHE   . OBESITY   . Posttraumatic stress disorder   . Pulmonary embolism ([place]) 03/2014   with DVT  . Stroke [facility]) 03/2014   dysarthria    Past Surgical History:  Procedure Laterality Date  . ABDOMINAL HYSTERECTOMY  1976  . BREAST SURGERY     biopsy  . RADIOLOGY WITH ANESTHESIA Left 03/08/2014   Procedure: RADIOLOGY WITH ANESTHESIA;  Surgeon: Rob Hickman, MD;  Location: [place];  Service: Radiology;  Laterality: Left;  . TONSILLECTOMY    . TOTAL HIP ARTHROPLASTY  1998    right     reports that  has never smoked. she has never used smokeless  tobacco. She reports that she does not drink alcohol or use drugs.  Allergies  Allergen Reactions  . Valtrex [Valacyclovir Hcl] Cough    Cough and gagging    Family History  Problem Relation Age of Onset  . Asthma Mother   . Asthma Father   . Prostate cancer Father   . Stroke Brother   . Breast cancer Sister   . Stomach cancer Sister   . Thyroid disease Neg Hx     Prior to Admission medications   Medication Sig Start Date End Date Taking? Authorizing Provider  acetaminophen (TYLENOL) 160 MG/5ML liquid Take 480 mg by mouth every 4 (four) hours as needed for fever or pain.   Yes [provider]  calamine lotion Apply 1 application as needed topically for itching. 11/09/16  Yes Nche, Charlene Brooke, NP  diclofenac sodium (VOLTAREN) 1 % GEL APPLY (2GMS) TOPICALLY THREE TIMES DAILY. 12/21/16  Yes [name], MD  hydroxyurea (HYDREA) 500 MG capsule Take 1 tablet (500 mg) Monday through Friday, take 2 tablets ( 1000 mg ) Saturday and Sunday 10/06/16  Yes Gorsuch, Ni, MD  ipratropium-albuterol (DUONEB) 0.5-2.5 (3) MG/3ML SOLN Take 3 mLs by nebulization every 6 (six) hours as needed. Patient taking differently: Take 3 mLs by nebulization every 6 (six) hours as needed (shortness of breath).  04/29/16  Yes Hosie Poisson, MD  lidocaine (LIDODERM) 5 % Place 1 patch daily onto the skin. Remove &  Discard patch within 12 hours or as directed by MD Patient taking differently: Place 1 patch onto the skin daily as needed (pain). Remove & Discard patch within 12 hours or as directed by MD 11/09/16  Yes Nche, Charlene Brooke, NP  methimazole (TAPAZOLE) 5 MG tablet TAKE 1 TABLET BY MOUTH TWICE WEEKLY 12/14/16  Yes Renato Shin, MD  polyethylene glycol (MIRALAX / GLYCOLAX) packet TAKE 17G BY MOUTH TWICE A DAY Patient taking differently: TAKE 17G BY MOUTH TWICE A DAY AS NEED FOR CONSTIPATION 03/12/16  Yes Burns, Claudina Lick, MD  pregabalin (LYRICA) 25 MG capsule Take 1 capsule (25 mg total) by mouth 2 (two)  times daily. 01/08/17  Yes Burns, Claudina Lick, MD  Probiotic Product (ALIGN) 4 MG CAPS Take 4 mg by mouth daily.    Yes [provider]  propranolol (INDERAL) 10 MG tablet Take 0.5 tablets (5 mg total) by mouth 2 (two) times daily. 05/26/16  Yes Bonnielee Haff, MD  PROTONIX 40 MG PACK TAKE 20 MLS (40 MG TOTAL) BY MOUTH DAILY. 09/11/16  Yes Burns, Claudina Lick, MD  terconazole (TERAZOL 7) 0.4 % vaginal cream Place 1 applicator vaginally at bedtime. Patient taking differently: Place 1 applicator vaginally at bedtime as needed (irritation).  04/18/15  Yes Harraway-Smith, Hoyle Sauer, MD  traMADol (ULTRAM) 50 MG tablet TAKE 2 TABLETS BY MOUTH 3 TIMES A DAY AS NEEDED Patient taking differently: TAKE 100 MG BY MOUTH 3 TIMES A DAY AS NEEDED FOR PAIN 12/01/16  Yes Burns, Claudina Lick, MD  traZODone (DESYREL) 50 MG tablet TAKE 1 TABLET (50 MG TOTAL) BY MOUTH AT BEDTIME AS NEEDED. FOR SLEEP 09/14/16  Yes Burns, Claudina Lick, MD  triamcinolone ointment (KENALOG) 0.5 % Apply 1 application topically 2 (two) times daily. Patient taking differently: Apply 1 application topically 2 (two) times daily as needed (irritation).  06/04/16  Yes Burns, Claudina Lick, MD  warfarin (COUMADIN) 2 MG tablet Take as directed by anticoagulation clinic. Patient taking differently: Take 2 mg by mouth See admin instructions. Take 2 mg every tues,wed, thurs, sat, & sun 01/21/17  Yes Burns, Claudina Lick, MD  warfarin (COUMADIN) 4 MG tablet TAKE AS DIRECTED BY ANTICOAGULATION CLINIC. Patient taking differently: Take 4 mg by mouth every Friday and  Monday 01/21/17  Yes Burns, Claudina Lick, MD  albuterol (PROVENTIL HFA) 108 (90 Base) MCG/ACT inhaler Inhale 2 puffs into the lungs every 6 (six) hours as needed for wheezing or shortness of breath. Patient not taking: Reported on 02/08/2017 05/15/15   Burnard Hawthorne, FNP  nystatin (MYCOSTATIN/NYSTOP) powder Apply topically 3 (three) times daily. Patient not taking: Reported on 02/08/2017 06/30/16   Heath Lark, MD    Physical  Exam: Vitals:   02/08/17 0200 02/08/17 0230 02/08/17 0300 02/08/17 0441  BP: 139/85 117/73 118/71   Pulse: (!) 105 (!) 103 (!) 107   Resp:      Temp:      TempSrc:      SpO2: 94% 93% 93%   Weight:    75.6 kg (166 lb 9.6 oz)  Height:    5' (1.524 m)      Constitutional: Moderately built and nourished. Vitals:   02/08/17 0200 02/08/17 0230 02/08/17 0300 02/08/17 0441  BP: 139/85 117/73 118/71   Pulse: (!) 105 (!) 103 (!) 107   Resp:      Temp:      TempSrc:      SpO2: 94% 93% 93%   Weight:    75.6 kg (166 lb  9.6 oz)  Height:    5' (1.524 m)   Eyes: Anicteric no pallor. ENMT: No discharge from the ears eyes nose or mouth. Neck: No mass felt.  No neck rigidity. Respiratory: No rhonchi or crepitations. Cardiovascular: S1-S2 heard no murmurs appreciated. Abdomen: Soft nontender bowel sounds present.  No guarding or rigidity. Musculoskeletal: No edema. Skin: No rash. Neurologic: Alert awake oriented to her name and follows commands right-sided hemiplegia.  Aphasia. Psychiatric: Patient is oriented to her name.  Has aphasia.   Labs on Admission: I have personally reviewed following labs and imaging studies  CBC: Recent Labs  Lab 02/07/17 2319  WBC 12.1*  NEUTROABS 10.9*  HGB 13.9  HCT 42.1  MCV 116.0*  PLT 546*   Basic Metabolic Panel: Recent Labs  Lab 02/07/17 2319  NA 141  K 3.5  CL 109  CO2 27  GLUCOSE 134*  BUN 14  CREATININE 0.41*  CALCIUM 8.8*   GFR: Estimated Creatinine Clearance: 49.2 mL/min (A) (by C-G formula based on SCr of 0.41 mg/dL (L)). Liver Function Tests: Recent Labs  Lab 02/07/17 2319  AST 146*  ALT 252*  ALKPHOS 154*  BILITOT 4.7*  PROT 6.3*  ALBUMIN 3.1*   No results for input(s): LIPASE, AMYLASE in the last 168 hours. No results for input(s): AMMONIA in the last 168 hours. Coagulation Profile: Recent Labs  Lab 02/07/17 2319  INR 3.36   Cardiac Enzymes: No results for input(s): CKTOTAL, CKMB, CKMBINDEX, TROPONINI in  the last 168 hours. BNP (last 3 results) No results for input(s): PROBNP in the last 8760 hours. HbA1C: No results for input(s): HGBA1C in the last 72 hours. CBG: No results for input(s): GLUCAP in the last 168 hours. Lipid Profile: No results for input(s): CHOL, HDL, LDLCALC, TRIG, CHOLHDL, LDLDIRECT in the last 72 hours. Thyroid Function Tests: No results for input(s): TSH, T4TOTAL, FREET4, T3FREE, THYROIDAB in the last 72 hours. Anemia Panel: No results for input(s): VITAMINB12, FOLATE, FERRITIN, TIBC, IRON, RETICCTPCT in the last 72 hours. Urine analysis:    Component Value Date/Time   COLORURINE AMBER (A) 02/08/2017 0015   APPEARANCEUR CLEAR 02/08/2017 0015   LABSPEC 1.023 02/08/2017 0015   PHURINE 5.0 02/08/2017 0015   GLUCOSEU NEGATIVE 02/08/2017 0015   GLUCOSEU NEGATIVE 08/22/2014 1700   HGBUR MODERATE (A) 02/08/2017 0015   BILIRUBINUR SMALL (A) 02/08/2017 0015   KETONESUR NEGATIVE 02/08/2017 0015   PROTEINUR 30 (A) 02/08/2017 0015   UROBILINOGEN 0.2 08/22/2014 1700   NITRITE NEGATIVE 02/08/2017 0015   LEUKOCYTESUR MODERATE (A) 02/08/2017 0015   Sepsis Labs: @LABRCNTIP (procalcitonin:4,lacticidven:4) )No results found for this or any previous visit (from the past 240 hour(s)).   Radiological Exams on Admission: Ct Abdomen Pelvis W Contrast  Result Date: 02/08/2017 CLINICAL DATA:  Acute onset of worsening generalized weakness. Nausea. Elevated LFTs. Red blood cells and white blood cells in the urine. EXAM: CT ABDOMEN AND PELVIS WITH CONTRAST TECHNIQUE: Multidetector CT imaging of the abdomen and pelvis was performed using the standard protocol following bolus administration of intravenous contrast. CONTRAST:  100 mL of Isovue 300 IV contrast COMPARISON:  CT of the abdomen and pelvis performed 05/18/2016 FINDINGS: Lower chest: There is partial consolidation of the right lower lobe, which may reflect atelectasis or pneumonia. Trace bilateral pleural fluid is noted. The  visualized portions of the mediastinum are grossly unremarkable. There is diffuse enhancement of collateral vessels along the left chest wall. Hepatobiliary: The liver is unremarkable in appearance. A stone is seen dependently within the  gallbladder. Gallbladder wall thickening is noted, with surrounding inflammation, raising question for mild acute cholecystitis. The common bile duct remains normal in caliber. Pancreas: The pancreas is within normal limits. Spleen: The spleen is unremarkable in appearance. Adrenals/Urinary Tract: The adrenal glands are unremarkable in appearance. The kidneys are grossly unremarkable. There is no evidence of hydronephrosis. No renal or ureteral stones are identified. No perinephric stranding is seen. Stomach/Bowel: The stomach is unremarkable in appearance. There is herniation of multiple small bowel loops into a relatively large right-sided anterior abdominal wall hernia, without evidence for obstruction or strangulation at this time. The remaining small bowel is grossly unremarkable. The appendix is not visualized; there is no evidence for appendicitis. The colon is unremarkable in appearance. There is dilatation of the rectum to 10 cm in diameter with dense stool, raising concern for fecal impaction. Vascular/Lymphatic: The abdominal aorta is unremarkable in appearance. The inferior vena cava is grossly unremarkable. No retroperitoneal lymphadenopathy is seen. No pelvic sidewall lymphadenopathy is identified. Reproductive: The bladder is mildly distended and grossly unremarkable. The uterus is diminutive and grossly unremarkable. No suspicious adnexal masses are seen. Other: There is diffuse enhancement of collateral vessels along the anterior abdominal wall. Musculoskeletal: No acute osseous abnormalities are identified. The patient's right hip arthroplasty is incompletely characterized, but appears grossly unremarkable. There is chronic loss of height at vertebral bodies L1, L2  and L3. The visualized musculature is unremarkable in appearance. IMPRESSION: 1. Gallbladder wall thickening, with surrounding soft tissue inflammation, raising concern for mild acute cholecystitis. Underlying cholelithiasis noted. Would correlate with the patient's symptoms. 2. Partial consolidation of the right lower lung lobe, which may reflect atelectasis or pneumonia. Trace bilateral pleural fluid noted. 3. Dilatation of the rectum to 10 cm in diameter with dense stool, raising concern for fecal impaction. 4. Herniation of multiple small bowel loops into a relatively large right-sided anterior abdominal wall hernia, without evidence for obstruction or strangulation at this time. 5. Diffuse enhancement of collateral vessels along the left chest wall and anterior abdominal wall. Electronically Signed   By: Garald Balding M.D.   On: 02/08/2017 04:15   Dg Chest Portable 1 View  Result Date: 02/07/2017 CLINICAL DATA:  Acute onset of altered status. Cough and shortness of breath. EXAM: PORTABLE CHEST 1 VIEW COMPARISON:  Chest radiograph performed 11/17/2016 FINDINGS: Hazy left-sided airspace opacity may reflect asymmetric pulmonary edema or pneumonia. No definite pleural effusion or pneumothorax is seen. There is persistent marked rightward tracheal deviation, reflecting the patient's thyroid goiter. The cardiomediastinal silhouette is mildly enlarged. No acute osseous abnormalities are seen. There is chronic superior subluxation of the left humeral head. IMPRESSION: Hazy left-sided airspace opacity may reflect asymmetric pulmonary edema or pneumonia. Mild cardiomegaly. Electronically Signed   By: Garald Balding M.D.   On: 02/07/2017 23:12     Assessment/Plan Principal Problem:   SIRS (systemic inflammatory response syndrome) (HCC) Active Problems:   Hyperthyroidism   Essential hypertension   Stroke, embolic (HCC)   History of pulmonary embolism   Essential thrombocytosis (HCC)   Nausea & vomiting    CAP (community acquired pneumonia)   Abnormal liver function tests    1. SIRS secondary to pneumonia and possible cholecystitis -patient has been kept n.p.o. except medications and on empiric antibiotics including vancomycin Zosyn and Zithromax.  Follow cultures.  Check urine for Legionella strep antigen influenza PCR and will check HIDA scan for cholecystitis and get surgical consult. 2. History of PE on Coumadin therapeutic.  At this time in  anticipation of possible procedure after discussing with patient's daughters they agreed to keep patient on IV heparin for now. 3. Essential thrombocytosis on hydroxyurea. 4. Hyperthyroism on Tapazole which will be held at this time due to elevated LFTs. 5. History of stroke with right-sided hemiparesis and aphasia presently on anticoagulation.   DVT prophylaxis: Heparin. Code Status: DO NOT INTUBATE. Family Communication: Patient's daughters. Disposition Plan: To be determined. Consults called: We will consult surgery. Admission status: Inpatient.   Rise Patience MD [facility] Pager [phone].  If 7PM-7AM, please contact night-coverage [email or web address] Password [email or web address]  02/08/2017, 4:53 AM

## 2017-02-08 NOTE — Progress Notes (Addendum)
RUE venous duplex prelim: negative for acute DVT and SVT. Chronic thrombus noted in the right IJV. Landry Mellow, RDMS, RVT

## 2017-02-08 NOTE — Progress Notes (Signed)
Pharmacy Antibiotic Note  Sandy Barrett is a 84 y.o. female admitted on 02/07/2017 with pneumonia.  Pharmacy has been consulted for Vancomycin and Zosyn dosing.  Plan: Zosyn 3.375g IV q8h (4 hour infusion).   Vancomycin 1250mg  iv q48hr Goal AUC = 400 - 500 for all indications, except meningitis (goal AUC > 500 and Cmin 15-20 mcg/mL)   Height: 5' (152.4 cm) Weight: 166 lb 9.6 oz (75.6 kg) IBW/kg (Calculated) : 45.5  Temp (24hrs), Avg:97.7 F (36.5 C), Min:97.6 F (36.4 C), Max:97.7 F (36.5 C)  Recent Labs  Lab 02/07/17 2319 02/07/17 2341  WBC 12.1*  --   CREATININE 0.41*  --   LATICACIDVEN  --  1.90    Estimated Creatinine Clearance: 49.2 mL/min (A) (by C-G formula based on SCr of 0.41 mg/dL (L)).    Allergies  Allergen Reactions  . Valtrex [Valacyclovir Hcl] Cough    Cough and gagging    Antimicrobials this admission: Vancomycin 02/08/2017 >> Zosyn 02/08/2017 >>   Dose adjustments this admission: -  Microbiology results: pending  Thank you for allowing pharmacy to be a part of this patient's care.  Nani Skillern Crowford 02/08/2017 5:03 AM

## 2017-02-09 ENCOUNTER — Telehealth: Payer: Self-pay | Admitting: Internal Medicine

## 2017-02-09 ENCOUNTER — Inpatient Hospital Stay (HOSPITAL_COMMUNITY): Payer: Medicare Other

## 2017-02-09 LAB — COMPREHENSIVE METABOLIC PANEL
ALT: 122 U/L — ABNORMAL HIGH (ref 14–54)
AST: 39 U/L (ref 15–41)
Albumin: 2.6 g/dL — ABNORMAL LOW (ref 3.5–5.0)
Alkaline Phosphatase: 104 U/L (ref 38–126)
Anion gap: 8 (ref 5–15)
BUN: 9 mg/dL (ref 6–20)
CO2: 23 mmol/L (ref 22–32)
Calcium: 8.2 mg/dL — ABNORMAL LOW (ref 8.9–10.3)
Chloride: 111 mmol/L (ref 101–111)
Creatinine, Ser: <0.3 mg/dL — ABNORMAL LOW (ref 0.44–1.00)
Glucose, Bld: 93 mg/dL (ref 65–99)
Potassium: 3.5 mmol/L (ref 3.5–5.1)
Sodium: 142 mmol/L (ref 135–145)
Total Bilirubin: 2.1 mg/dL — ABNORMAL HIGH (ref 0.3–1.2)
Total Protein: 5 g/dL — ABNORMAL LOW (ref 6.5–8.1)

## 2017-02-09 LAB — CBC
HCT: 37.7 % (ref 36.0–46.0)
Hemoglobin: 12.2 g/dL (ref 12.0–15.0)
MCH: 38.1 pg — ABNORMAL HIGH (ref 26.0–34.0)
MCHC: 32.4 g/dL (ref 30.0–36.0)
MCV: 117.8 fL — ABNORMAL HIGH (ref 78.0–100.0)
Platelets: 388 10*3/uL (ref 150–400)
RBC: 3.2 MIL/uL — ABNORMAL LOW (ref 3.87–5.11)
RDW: 15.7 % — ABNORMAL HIGH (ref 11.5–15.5)
WBC: 8.4 10*3/uL (ref 4.0–10.5)

## 2017-02-09 LAB — GLUCOSE, CAPILLARY
Glucose-Capillary: 82 mg/dL (ref 65–99)
Glucose-Capillary: 76 mg/dL (ref 65–99)
Glucose-Capillary: 107 mg/dL — ABNORMAL HIGH (ref 65–99)

## 2017-02-09 LAB — PROTIME-INR
INR: 2.05
Prothrombin Time: 23 seconds — ABNORMAL HIGH (ref 11.4–15.2)

## 2017-02-09 MED ORDER — HYDROXYUREA 500 MG PO CAPS
500.0000 mg | ORAL_CAPSULE | Freq: Every day | ORAL | Status: DC
  Administered 2017-02-09 – 2017-02-10 (×2): 500 mg via ORAL
  Filled 2017-02-09 (×2): qty 1

## 2017-02-09 MED ORDER — CHLORHEXIDINE GLUCONATE CLOTH 2 % EX PADS
6.0000 | MEDICATED_PAD | Freq: Every day | CUTANEOUS | Status: DC
  Administered 2017-02-10: 6 via TOPICAL

## 2017-02-09 MED ORDER — SALINE SPRAY 0.65 % NA SOLN
1.0000 | NASAL | Status: DC | PRN
  Filled 2017-02-09: qty 44

## 2017-02-09 MED ORDER — BENZONATATE 100 MG PO CAPS: 100.0000 mg | ORAL_CAPSULE | Freq: Three times a day (TID) | ORAL | Status: DC | PRN

## 2017-02-09 MED ORDER — MUPIROCIN 2 % EX OINT
1.0000 "application " | TOPICAL_OINTMENT | Freq: Two times a day (BID) | CUTANEOUS | Status: DC
  Administered 2017-02-09 – 2017-02-10 (×3): 1 via NASAL
  Filled 2017-02-09: qty 22

## 2017-02-09 MED ORDER — WARFARIN - PHARMACIST DOSING INPATIENT: Freq: Every day | Status: DC

## 2017-02-09 MED ORDER — WARFARIN SODIUM 5 MG PO TABS
5.0000 mg | ORAL_TABLET | Freq: Once | ORAL | Status: AC
  Administered 2017-02-09: 5 mg via ORAL
  Filled 2017-02-09: qty 1

## 2017-02-09 NOTE — Progress Notes (Signed)
Pt's daughter inquired about Radioactive Iodine Procedure, states procedure is schedule for this upcoming Thursday and asked if it could be done here; MD updated, pt and daughter informed that procedure will need to be done outpatient.  SRP, RN

## 2017-02-09 NOTE — Telephone Encounter (Signed)
pts daughter calling about pt having the radioactive Iodine pill. If it has been approved by the dr.   Please advise [phone]

## 2017-02-09 NOTE — Progress Notes (Signed)
PROGRESS NOTE    Sandy Barrett  QMG:[id number] DOB: 27-May-1932 DOA: 02/07/2017 PCP: [name], MD    Brief Narrative:  84 y.o. female with history of stroke with right-sided hemiplegia and aphasia, PE, hyperthyroidism, essential thrombocytosis was brought to the ER patient's daughter found that patient had thrown up twice yesterday.  Was the time patient threw up mucoid.  Patient is aphasic and does not complain but did not notice any chest pain or shortness of breath or any abdominal pain.  Has not had any diarrhea.  No fever or chills.  Only medication change was Lyrica from gabapentin.   Assessment & Plan:   Principal Problem:   SIRS (systemic inflammatory response syndrome) (HCC) Active Problems:   Hyperthyroidism   Essential hypertension   Stroke, embolic (HCC)   History of pulmonary embolism   Essential thrombocytosis (HCC)   Nausea & vomiting   CAP (community acquired pneumonia)   Abnormal liver function tests  1. SIRS secondary to pneumonia with cholecystitis 1. General surgery consulted. Recs reviewed. Not ideal surgical candidate. Initial rec for perc drain by IR 2. HIDA reviewed. Delayed filling of gallbladder noted 3. Patient seen by IR, not candidate for drain placement 4. Likely chronic cholecystitis with recommendation for transition to PO augmentin on discharge, total 7-10 days tx 5. Continued on zosyn while in hospital, which would also treat possible asp PNA 2. History of PE on therapeutic Coumadin 1. Patient had been initially continued on heparin gtt 2. OK to transition to coumadin per Surgery. Coumadin pharmacy consult placed 3. Essential thrombocytosis 1. Continued on hydroxyurea. 2. Stable at present 4. Hyperthyroism on Tapazole  1. Tapazole held secondary to elevated LFTs. 5. History of stroke with right-sided hemiparesis and aphasia presently on anticoagulation. 1. Seems stable currently 2. Pharmacy consulted for transition back to  coumadin 6. R arm swelling 1. UE dopplers neg for DVT, however doppler is notable for chronic R IJV thrombus 2. On therapeutic anticoagulation  DVT prophylaxis: heparin gtt Code Status: Partial Family Communication: Pt in room, family at bedside Disposition Plan: Uncertain at this time  Consultants:   General Surgery  IR  Procedures:   HIDA 2/4  Antimicrobials: Anti-infectives (From admission, onward)   Start     Dose/Rate Route Frequency Ordered Stop   02/10/17 0600  vancomycin (VANCOCIN) 1,250 mg in sodium chloride 0.9 % 250 mL IVPB  Status:  Discontinued     1,250 mg 166.7 mL/hr over 90 Minutes Intravenous Every 48 hours 02/08/17 0503 02/09/17 1017   02/08/17 0515  piperacillin-tazobactam (ZOSYN) IVPB 3.375 g     3.375 g 12.5 mL/hr over 240 Minutes Intravenous Every 8 hours 02/08/17 0503     02/08/17 0515  vancomycin (VANCOCIN) 1,250 mg in sodium chloride 0.9 % 250 mL IVPB     1,250 mg 166.7 mL/hr over 90 Minutes Intravenous  Once 02/08/17 0503 02/08/17 1113   02/08/17 0500  azithromycin (ZITHROMAX) 500 mg in dextrose 5 % 250 mL IVPB  Status:  Discontinued     500 mg 250 mL/hr over 60 Minutes Intravenous Daily 02/08/17 0452 02/08/17 1102   02/07/17 2345  cefTRIAXone (ROCEPHIN) 1 g in dextrose 5 % 50 mL IVPB     1 g 100 mL/hr over 30 Minutes Intravenous  Once 02/07/17 2337 02/08/17 0138      Subjective: Multiple bowel movements noted overnight  Objective: Vitals:   02/08/17 1546 02/09/17 0644 02/09/17 1000 02/09/17 1400  BP: (!) 154/95 135/76 123/78 121/79  Pulse: [name])  103 84 86 92  Resp: 18 18 20 18   Temp:  98.3 F (36.8 C) 97.7 F (36.5 C)   TempSrc:  Oral Oral Oral  SpO2: 98% 100% (!) 89% 97%  Weight:      Height:        Intake/Output Summary (Last 24 hours) at 02/09/2017 1451 Last data filed at 02/09/2017 0543 Gross per 24 hour  Intake 1218.75 ml  Output -  Net 1218.75 ml   Filed Weights   02/07/17 2239 02/08/17 0441  Weight: 79.8 kg (176 lb) 75.6  kg (166 lb 9.6 oz)    Examination: General exam: Awake, laying in bed, in nad Respiratory system: Normal respiratory effort, no wheezing Cardiovascular system: regular rate, s1, s2 Gastrointestinal system: Soft, nondistended, positive BS Central nervous system: CN2-12 grossly intact, strength intact Extremities: Perfused, no clubbing Skin: Normal skin turgor, no notable skin lesions seen Psychiatry: difficult to assess given baseline aphasia   Data Reviewed: I have personally reviewed following labs and imaging studies  CBC: Recent Labs  Lab 02/07/17 2319 02/08/17 0631 02/09/17 0529  WBC 12.1* 11.4* 8.4  NEUTROABS 10.9* 10.0*  --   HGB 13.9 12.8 12.2  HCT 42.1 38.7 37.7  MCV 116.0* 115.5* 117.8*  PLT 415* 392 124   Basic Metabolic Panel: Recent Labs  Lab 02/07/17 2319 02/08/17 0631 02/09/17 0440  NA 141 141 142  K 3.5 3.6 3.5  CL 109 110 111  CO2 27 24 23   GLUCOSE 134* 119* 93  BUN 14 13 9   CREATININE 0.41* 0.35* <0.30*  CALCIUM 8.8* 8.5* 8.2*   GFR: CrCl cannot be calculated (This lab value cannot be used to calculate CrCl because it is not a number: <0.30). Liver Function Tests: Recent Labs  Lab 02/07/17 2319 02/08/17 0631 02/09/17 0440  AST 146* 87*  92* 39  ALT 252* 197*  198* 122*  ALKPHOS 154* 131*  129* 104  BILITOT 4.7* 2.9*  2.8* 2.1*  PROT 6.3* 5.7*  5.8* 5.0*  ALBUMIN 3.1* 2.8*  2.9* 2.6*   No results for input(s): LIPASE, AMYLASE in the last 168 hours. No results for input(s): AMMONIA in the last 168 hours. Coagulation Profile: Recent Labs  Lab 02/07/17 2319 02/08/17 0631 02/09/17 0440  INR 3.36 3.25 2.05   Cardiac Enzymes: No results for input(s): CKTOTAL, CKMB, CKMBINDEX, TROPONINI in the last 168 hours. BNP (last 3 results) No results for input(s): PROBNP in the last 8760 hours. HbA1C: No results for input(s): HGBA1C in the last 72 hours. CBG: Recent Labs  Lab 02/08/17 0738 02/08/17 1612 02/08/17 2228 02/09/17 0757   GLUCAP 144* 81 99 82   Lipid Profile: No results for input(s): CHOL, HDL, LDLCALC, TRIG, CHOLHDL, LDLDIRECT in the last 72 hours. Thyroid Function Tests: No results for input(s): TSH, T4TOTAL, FREET4, T3FREE, THYROIDAB in the last 72 hours. Anemia Panel: No results for input(s): VITAMINB12, FOLATE, FERRITIN, TIBC, IRON, RETICCTPCT in the last 72 hours. Sepsis Labs: Recent Labs  Lab 02/07/17 2341 02/08/17 0631  PROCALCITON  --  0.30  LATICACIDVEN 1.90 0.8    Recent Results (from the past 240 hour(s))  MRSA PCR Screening     Status: Abnormal   Collection Time: 02/08/17  3:41 PM  Result Value Ref Range Status   MRSA by PCR POSITIVE (A) NEGATIVE Final    Comment:        The GeneXpert MRSA Assay (FDA approved for NASAL specimens only), is one component of a comprehensive MRSA colonization surveillance program.  It is not intended to diagnose MRSA infection nor to guide or monitor treatment for MRSA infections. RESULT CALLED TO, READ BACK BY AND VERIFIED WITHLoletha Grayer PHILLIPS,RN [id number] @ 0272 BY J SCOTTON Performed at [facility], [place] [address], [place], [place] [address]      Radiology Studies:   Scheduled Meds: . acidophilus  1 capsule Oral Daily  . [START ON 02/10/2017] Chlorhexidine Gluconate Cloth  6 each Topical Q0600  . mupirocin ointment  1 application Nasal BID  . pantoprazole sodium  40 mg Oral Daily  . pregabalin  25 mg Oral BID  . propranolol  5 mg Oral BID  . warfarin  5 mg Oral Once  . Warfarin - Pharmacist Dosing Inpatient   Does not apply q1800   Continuous Infusions: . piperacillin-tazobactam (ZOSYN)  IV 3.375 g (02/09/17 1329)     LOS: 1 day   Marylu Lund, MD [facility] Pager [phone]  If 7PM-7AM, please contact night-coverage [email or web address] Password [email or web address] 02/09/2017, 2:51 PM

## 2017-02-09 NOTE — Progress Notes (Signed)
Pt has 2 inct episode or urine and stool bladder scanned pt to check output. Scanned noted to be 111-125cc, Will cont to monitor pt. SRP, RN

## 2017-02-09 NOTE — Progress Notes (Signed)
Checked pt for inct of urine, pad clear and dry at current time, will update next shift for bladder scanned findings. Daughter at bedside. SRP, RN

## 2017-02-09 NOTE — Care Management Note (Signed)
Case Management Note  Patient Details  Name: Sandy Barrett MRN: [id number] Date of Birth: 08-15-1932  Subjective/Objective:   Pt admitted with SIRS                 Action/Plan: Plan to discharge home with [facility].    Expected Discharge Date:                  Expected Discharge Plan:  [place]  In-House Referral:     Discharge planning Services  CM Consult  Post Acute Care Choice:    Choice offered to:     DME Arranged:    DME Agency:     HH Arranged:  RN, PT, OT, Nurse's Aide [place] Agency:  [facility] (now [facility])  Status of Service:  In process, will continue to follow  If discussed at Long Length of Stay Meetings, dates discussed:    Additional CommentsPurcell Mouton, RN 02/09/2017, 2:58 PM

## 2017-02-09 NOTE — Progress Notes (Signed)
[facility] Progress Note     Subjective: CC: No complaints Patient's daughter present at bedside and reports that patient appears to be feeling much better overall. Patient has no abdominal pain. No further n/v and has tolerated CLD. Had a few BMs after enema yesterday. Discussed with daughter and patient that I do not think patient necessarily needs cholecystectomy at this time with resolution in symptoms. They expressed that they really would not want to put her through a surgery anyway if able to avoid.  Afebrile, mildly tachycardic.   Objective: Vital signs in last 24 hours: Temp:  [97.7 F (36.5 C)-98.3 F (36.8 C)] 97.7 F (36.5 C) (02/05 1000) Pulse Rate:  [84-103] 86 (02/05 1000) Resp:  [18-20] 20 (02/05 1000) BP: (123-154)/(76-95) 123/78 (02/05 1000) SpO2:  [89 %-100 %] 89 % (02/05 1000) Last BM Date: 02/08/17  Intake/Output from previous day: 02/04 0701 - 02/05 0700 In: 1643.8 [I.V.:1443.8; IV Piggyback:200] Out: -  Intake/Output this shift: No intake/output data recorded.  PE: Gen:  Alert, NAD, pleasant Card:  Regular rate and rhythm, pedal pulses 2+ BL Pulm:  Normal effort, clear to auscultation bilaterally Abd: Soft, non-tender, non-distended, bowel sounds present in all 4 quadrants, no HSM Skin: warm and dry, no rashes  Psych: A&Ox3   Lab Results:  Recent Labs    02/08/17 0631 02/09/17 0529  WBC 11.4* 8.4  HGB 12.8 12.2  HCT 38.7 37.7  PLT 392 388   BMET Recent Labs    02/08/17 0631 02/09/17 0440  NA 141 142  K 3.6 3.5  CL 110 111  CO2 24 23  GLUCOSE 119* 93  BUN 13 9  CREATININE 0.35* <0.30*  CALCIUM 8.5* 8.2*   PT/INR Recent Labs    02/08/17 0631 02/09/17 0440  LABPROT 32.9* 23.0*  INR 3.25 2.05   CMP     Component Value Date/Time   NA 142 02/09/2017 0440   NA 142 10/06/2016 1352   K 3.5 02/09/2017 0440   K 4.1 10/06/2016 1352   CL 111 02/09/2017 0440   CO2 23 02/09/2017 0440   CO2 27 10/06/2016 1352   GLUCOSE  93 02/09/2017 0440   GLUCOSE 90 10/06/2016 1352   BUN 9 02/09/2017 0440   BUN 12.9 10/06/2016 1352   CREATININE <0.30 (L) 02/09/2017 0440   CREATININE 0.6 10/06/2016 1352   CALCIUM 8.2 (L) 02/09/2017 0440   CALCIUM 9.4 10/06/2016 1352   PROT 5.0 (L) 02/09/2017 0440   ALBUMIN 2.6 (L) 02/09/2017 0440   AST 39 02/09/2017 0440   ALT 122 (H) 02/09/2017 0440   ALKPHOS 104 02/09/2017 0440   BILITOT 2.1 (H) 02/09/2017 0440   GFRNONAA NOT CALCULATED 02/09/2017 0440   GFRAA NOT CALCULATED 02/09/2017 0440   Lipase     Component Value Date/Time   LIPASE 31 06/27/2015 0454       Studies/Results:   Anti-infectives: Anti-infectives (From admission, onward)   Start     Dose/Rate Route Frequency Ordered Stop   02/10/17 0600  vancomycin (VANCOCIN) 1,250 mg in sodium chloride 0.9 % 250 mL IVPB  Status:  Discontinued     1,250 mg 166.7 mL/hr over 90 Minutes Intravenous Every 48 hours 02/08/17 0503 02/09/17 1017   02/08/17 0515  piperacillin-tazobactam (ZOSYN) IVPB 3.375 g     3.375 g 12.5 mL/hr over 240 Minutes Intravenous Every 8 hours 02/08/17 0503     02/08/17 0515  vancomycin (VANCOCIN) 1,250 mg in sodium chloride 0.9 % 250 mL IVPB  1,250 mg 166.7 mL/hr over 90 Minutes Intravenous  Once 02/08/17 0503 02/08/17 1113   02/08/17 0500  azithromycin (ZITHROMAX) 500 mg in dextrose 5 % 250 mL IVPB  Status:  Discontinued     500 mg 250 mL/hr over 60 Minutes Intravenous Daily 02/08/17 0452 02/08/17 1102   02/07/17 2345  cefTRIAXone (ROCEPHIN) 1 g in dextrose 5 % 50 mL IVPB     1 g 100 mL/hr over 30 Minutes Intravenous  Once 02/07/17 2337 02/08/17 0138       Assessment/Plan Hyperthyroidism HTN Hx of embolic stroke - patient with R hemiplegia and aphasia Hx of PE Essential thrombocytosis - on hydroxyurea CAP - RLL, per primary service RUE swelling - No DVT on [place]  Nausea and vomiting Elevated LFTs- trending down Cholelithiasis - CT 2/4: gallbladder wall thickening with some  surrounding inflammation, concerning for mild acute cholecystitis; cholelithiasis - patient with no abdominal pain today, WBC 8.4, afeb - HIDA consistent with chronic cholecystitis - INR 2.05 today - IR feels patient not appropriate for percutaneous drainage - patient does not clinically meet indication for cholecystectomy at this time and patient is a poor surgical candidate overall - family expressed that they really do not want surgery - HH diet - can d/c home from a surgery standpoint if tolerating diet and pain free, can send on 7-10 days PO augmentin - ok to transition back to home coumadin R anterior abdominal wall hernia - no signs of strangulation or incarceration  Fecal impaction - had stools yesterday  FEN: HH/CM diet VTE: SCDs, heparin gtt ID: IV zithromax 2/4>>; IV rocephin x1; IV vanc 2/4>>; IV Zosyn 2/4>>    LOS: 1 day    Brigid Re , [facility] Surgery 02/09/2017, 11:17 AM Pager: [phone] Consults: [phone] Mon-Fri 7:00 am-4:30 pm Sat-Sun 7:00 am-11:30 am

## 2017-02-09 NOTE — Progress Notes (Signed)
Spoke with pt's daughter concerning [place] needs and transportation. Daughter selected [facility], in house rep called. Daughter will call to set transportation up for in AM with [employer] transportation. [facility] will follow for Home O2 needs.

## 2017-02-09 NOTE — Progress Notes (Signed)
Medications administered by student RN 0700-1700 with supervision of Clinical Instructor Darla Mcdonald MSN, RN-BC or patient's assigned RN.   

## 2017-02-09 NOTE — Progress Notes (Addendum)
ANTICOAGULATION CONSULT NOTE - Initial Consult  Pharmacy Consult for Heparin Indication: Hx of VTE, CVA; PTA warfarin held  Allergies  Allergen Reactions  . Valtrex [Valacyclovir Hcl] Cough    Cough and gagging    Patient Measurements: Height: 5' (152.4 cm) Weight: 166 lb 9.6 oz (75.6 kg) IBW/kg (Calculated) : 45.5 Heparin Dosing Weight: 62.5  Vital Signs: Temp: 98.3 F (36.8 C) (02/05 0644) Temp Source: Oral (02/05 0644) BP: 135/76 (02/05 0644) Pulse Rate: 84 (02/05 0644)  Labs: Recent Labs    02/07/17 2319 02/08/17 0631 02/09/17 0440 02/09/17 0529  HGB 13.9 12.8  --  12.2  HCT 42.1 38.7  --  37.7  PLT 415* 392  --  388  APTT  --  49*  --   --   LABPROT 33.8* 32.9* 23.0*  --   INR 3.36 3.25 2.05  --   CREATININE 0.41* 0.35* <0.30*  --     CrCl cannot be calculated (This lab value cannot be used to calculate CrCl because it is not a number: <0.30).   Medical History: Past Medical History:  Diagnosis Date  . ARTHRITIS   . Arthritis   . Diverticulosis   . HYPERTENSION   . HYPERTHYROIDISM   . Hyperthyroidism    s/p I-131 ablation 03/2011 of multinod goiter  . INSOMNIA   . MIGRAINE HEADACHE   . OBESITY   . Posttraumatic stress disorder   . Pulmonary embolism ([place]) 03/2014   with DVT  . Stroke [facility]) 03/2014   dysarthria    Medications:  Scheduled:  . acidophilus  1 capsule Oral Daily  . pantoprazole sodium  40 mg Oral Daily  . pregabalin  25 mg Oral BID  . propranolol  5 mg Oral BID   Infusions:  . piperacillin-tazobactam (ZOSYN)  IV 3.375 g (02/09/17 0537)  . [START ON 02/10/2017] vancomycin      Assessment: 62 yoF admitted on 02/08/17 with n/v, pneumonia, acute cholecystitis.  PMH is significant for PE, DVT, and CVA on chronic warfarin.  Pharmacy is consulted to dose heparin while warfarin is held pending surgical workup.  PTA warfarin dose was 2mg  daily except 4mg  on  Monday/Friday.  Last dose noted on 2/2 at 2100. Admission INR 3.36 is  supratherapeutic.   Today, 02/09/2017:  INR 2.05, remains therapeutic but significantly decreased after holding x2 days  CBC: Hgb and Plt WNL/stable  No bleeding or complications reported.  Goal of Therapy:  Heparin level 0.3-0.7 units/ml Monitor platelets by anticoagulation protocol: Yes   Plan:   Continue to hold off starting heparin infusion until INR < 2  Daily INR  Follow up procedural/surgical plans and timing.  Gretta Arab PharmD, BCPS Pager [phone] 02/09/2017 7:00 AM   Addendum: No longer planning for surgical intervention.  Pharmacy is consulted to resume warfarin dosing. INR 2.05 remains therapeutic, but has decreased significantly after holding x2 days.  Expect that INR may decrease further to subtherapeutic levels tomorrow. Plan:  Warfarin 5mg  PO x1 dose now as boosted dose to keep in therapeutic range.  Daily INR   Gretta Arab PharmD, BCPS Pager [phone] 02/09/2017 2:14 PM

## 2017-02-09 NOTE — Progress Notes (Signed)
Daughter called and asked that pt go by Sandy Barrett. O2 Sats at present time is stable at 95-97% room air.

## 2017-02-10 ENCOUNTER — Encounter: Payer: Self-pay | Admitting: Internal Medicine

## 2017-02-10 DIAGNOSIS — D473 Essential (hemorrhagic) thrombocythemia: Secondary | ICD-10-CM

## 2017-02-10 LAB — CREATININE, SERUM: Creatinine, Ser: <0.3 mg/dL — ABNORMAL LOW (ref 0.44–1.00)

## 2017-02-10 LAB — STREP PNEUMONIAE URINARY ANTIGEN: Strep Pneumo Urinary Antigen: NEGATIVE

## 2017-02-10 LAB — GLUCOSE, CAPILLARY: Glucose-Capillary: 89 mg/dL (ref 65–99)

## 2017-02-10 LAB — PROTIME-INR
INR: 2.13
Prothrombin Time: 23.6 seconds — ABNORMAL HIGH (ref 11.4–15.2)

## 2017-02-10 MED ORDER — AMOXICILLIN-POT CLAVULANATE 875-125 MG PO TABS: 1.0000 | ORAL_TABLET | Freq: Two times a day (BID) | ORAL | 0 refills | Status: DC

## 2017-02-10 NOTE — Progress Notes (Signed)
Pt discharged to home, instructions reviewed with daughter and patient. Question addressed and answered. EMS called for 1230 pickup. Will cont to monitor. SRP, RN

## 2017-02-10 NOTE — Telephone Encounter (Signed)
Pt's daughter [name] is aware. She states that now that they know she is in content and in briefs they will not do the test as outpatient, pts daughter would like to know what they can do now? She is in the hospital now but they informed her they are unable to do the test either. Please advise

## 2017-02-10 NOTE — Telephone Encounter (Signed)
No answer just rang and rang with no VM

## 2017-02-10 NOTE — Telephone Encounter (Signed)
Please advise on below  

## 2017-02-10 NOTE — Telephone Encounter (Signed)
GREAT! Noted.

## 2017-02-10 NOTE — Progress Notes (Signed)
Pt. Incontinent of urine and BM x1 during shift. Upon AM assessment, pt. Had a spot of urine on pad. Bathed and CHG wipes done. Bladder scanner performed with a result of 0 mL. Pt. And daughter educated, questions answered properly. Pt.'s daughter made RN aware that the pt. Urine output was also low at home d/t low PO fluid intake. Will continue to monitor and will update next shift.   Drucilla Chalet, RN

## 2017-02-10 NOTE — Final Consult Note (Signed)
[facility] Progress Note     Subjective: CC: no complaints Patient tolerated diet. No nausea or vomiting. Patient having bowel function. Patient not having any abdominal pain. Patient's daughter and patient still express no desire for surgery at this time.   Objective: Vital signs in last 24 hours: Temp:  [97.7 F (36.5 C)-98.2 F (36.8 C)] 98.2 F (36.8 C) (02/06 0514) Pulse Rate:  [85-98] 85 (02/06 0514) Resp:  [16-20] 16 (02/06 0514) BP: (121-152)/(68-83) 131/83 (02/06 0514) SpO2:  [89 %-97 %] 94 % (02/06 0517) Weight:  [78.3 kg (172 lb 9.9 oz)] 78.3 kg (172 lb 9.9 oz) (02/06 0514) Last BM Date: 02/09/17  Intake/Output from previous day: 02/05 0701 - 02/06 0700 In: 150 [IV Piggyback:150] Out: -  Intake/Output this shift: No intake/output data recorded.  PE: Gen:  Alert, resting comfortably Card:  pedal pulses 2+ BL Pulm:  Normal effort Abd: Soft, non-tender, non-distended, bowel sounds present, no HSM Skin: warm and dry, no rashes   Lab Results:  Recent Labs    02/08/17 0631 02/09/17 0529  WBC 11.4* 8.4  HGB 12.8 12.2  HCT 38.7 37.7  PLT 392 388   BMET Recent Labs    02/08/17 0631 02/09/17 0440 02/10/17 0434  NA 141 142  --   K 3.6 3.5  --   CL 110 111  --   CO2 24 23  --   GLUCOSE 119* 93  --   BUN 13 9  --   CREATININE 0.35* <0.30* <0.30*  CALCIUM 8.5* 8.2*  --    PT/INR Recent Labs    02/09/17 0440 02/10/17 0750  LABPROT 23.0* 23.6*  INR 2.05 2.13   CMP     Component Value Date/Time   NA 142 02/09/2017 0440   NA 142 10/06/2016 1352   K 3.5 02/09/2017 0440   K 4.1 10/06/2016 1352   CL 111 02/09/2017 0440   CO2 23 02/09/2017 0440   CO2 27 10/06/2016 1352   GLUCOSE 93 02/09/2017 0440   GLUCOSE 90 10/06/2016 1352   BUN 9 02/09/2017 0440   BUN 12.9 10/06/2016 1352   CREATININE <0.30 (L) 02/10/2017 0434   CREATININE 0.6 10/06/2016 1352   CALCIUM 8.2 (L) 02/09/2017 0440   CALCIUM 9.4 10/06/2016 1352   PROT 5.0 (L)  02/09/2017 0440   ALBUMIN 2.6 (L) 02/09/2017 0440   AST 39 02/09/2017 0440   ALT 122 (H) 02/09/2017 0440   ALKPHOS 104 02/09/2017 0440   BILITOT 2.1 (H) 02/09/2017 0440   GFRNONAA NOT CALCULATED 02/10/2017 0434   GFRAA NOT CALCULATED 02/10/2017 0434   Lipase     Component Value Date/Time   LIPASE 31 06/27/2015 0454       Studies/Results:   Anti-infectives: Anti-infectives (From admission, onward)   Start     Dose/Rate Route Frequency Ordered Stop   02/10/17 0600  vancomycin (VANCOCIN) 1,250 mg in sodium chloride 0.9 % 250 mL IVPB  Status:  Discontinued     1,250 mg 166.7 mL/hr over 90 Minutes Intravenous Every 48 hours 02/08/17 0503 02/09/17 1017   02/08/17 0515  piperacillin-tazobactam (ZOSYN) IVPB 3.375 g     3.375 g 12.5 mL/hr over 240 Minutes Intravenous Every 8 hours 02/08/17 0503     02/08/17 0515  vancomycin (VANCOCIN) 1,250 mg in sodium chloride 0.9 % 250 mL IVPB     1,250 mg 166.7 mL/hr over 90 Minutes Intravenous  Once 02/08/17 0503 02/08/17 1113   02/08/17 0500  azithromycin (ZITHROMAX) 500 mg  in dextrose 5 % 250 mL IVPB  Status:  Discontinued     500 mg 250 mL/hr over 60 Minutes Intravenous Daily 02/08/17 0452 02/08/17 1102   02/07/17 2345  cefTRIAXone (ROCEPHIN) 1 g in dextrose 5 % 50 mL IVPB     1 g 100 mL/hr over 30 Minutes Intravenous  Once 02/07/17 2337 02/08/17 0138       Assessment/Plan Hyperthyroidism HTN Hx of embolic stroke - patient with R hemiplegia and aphasia Hx of PE Essential thrombocytosis - on hydroxyurea CAP - RLL, per primary service RUE swelling - No DVT on [place]  Nausea and vomiting Elevated LFTs- trending down Cholelithiasis - CT 2/4: gallbladder wall thickening with some surrounding inflammation, concerning for mild acute cholecystitis; cholelithiasis - patient with no abdominal pain today, afebrile - HIDA consistent with chronic cholecystitis - INR 2.13 today - IR feels patient not appropriate for percutaneous drainage -  patient does not clinically meet indication for cholecystectomy at this time and patient is a poor surgical candidate overall - family expressed that they really do not want surgery - tolerating HH diet - can d/c home from a surgery standpoint if tolerating diet and pain free, can send on 7-10 days PO augmentin - ok to transition back to home coumadin R anterior abdominal wall hernia- no signs of strangulation or incarceration  Fecal impaction- had stools yesterday  FEN: HH/CM diet VTE: SCDs, heparin gtt ID: IV zithromax 2/4>>; IV rocephin x1; IV vanc 2/4>>; IV Zosyn 2/4>>  Surgery will sign off. Please call with any questions or concerns.   LOS: 2 days    Brigid Re , [facility] Surgery 02/10/2017, 8:19 AM Pager: [phone] Consults: [phone] Mon-Fri 7:00 am-4:30 pm Sat-Sun 7:00 am-11:30 am

## 2017-02-10 NOTE — Progress Notes (Signed)
EMS arrived to pick up patient, daugther at bedside and will ride with EMS. Pt in stable condition. O2 sats 98  % on RA.

## 2017-02-10 NOTE — Discharge Summary (Signed)
Physician Discharge Summary  [name] QMV:[id number] DOB: 02-27-1932 DOA: 02/07/2017  PCP: [name], MD  Admit date: 02/07/2017 Discharge date: 02/10/2017  Admitted From: home Disposition:  home  Recommendations for Outpatient Follow-up:  1. Follow up with PCP in 1-2 weeks 2. Continue Augmentin for 7 additional days  Home Health: PT Equipment/Devices: none   Discharge Condition: stable CODE STATUS: Partial (no intubation) Diet recommendation: regular  HPI: Per Dr. Glyn Ade,  Sandy Barrett is a 84 y.o. female with history of stroke with right-sided hemiplegia and aphasia, PE, hyperthyroidism, essential thrombocytosis was brought to the ER patient's daughter found that patient had thrown up twice yesterday.  Was the time patient threw up mucoid.  Patient is aphasic and does not complain but did not notice any chest pain or shortness of breath or any abdominal pain.  Has not had any diarrhea.  No fever or chills.  Only medication change was Lyrica from gabapentin. ED Course: In the ER patient's labs reveal elevated LFTs with chest x-ray showing possible infiltrates concerning for pneumonia.  CT abdomen pelvis done shows possibility of acute cholecystitis and consolidation in the lung.  Blood cultures obtained and started on empiric antibiotics.  Patient admitted for SIRS secondary to pneumonia and possible cholecystitis.  On exam abdomen appears benign.   Hospital Course: Sepsis due to aspiration pneumonia and cholecystitis -concern for cholecystitis as well as aspiration pneumonia in the setting of vomiting.  General surgery was consulted, and she was not deemed to be an ideal surgical candidate, IR evaluated patient as well for cholecystostomy, felt not to be a candidate.  HIDA scan showed delayed filling of the gallbladder.  She was placed on antibiotics with Zosyn, she improved with conservative management, her nausea and vomiting resolved and she is able to tolerate a  regular diet.  She will be transitioned to Augmentin for 7 additional days on discharge, and will follow up with general surgery as an outpatient History of pulmonary embolus -continue Coumadin Essential thrombocytosis - continue home medications, stable Hyperthyroidism/multinodular goiter -continue Tapazole, arrangements are being made for patient to receive radioactive iodine in an outpatient setting History of stroke - with right-sided hemiparesis and aphasia -stable, continue Coumadin Right arm swelling -Dopplers negative for DVT, however did show chronic right IJ thrombus, on anticoagulation   Discharge Diagnoses:  Principal Problem:   SIRS (systemic inflammatory response syndrome) ([place]) Active Problems:   Hyperthyroidism   Essential hypertension   Stroke, embolic ([place])   History of pulmonary embolism   Essential thrombocytosis ([facility])   Nausea & vomiting   CAP (community acquired pneumonia)   Abnormal liver function tests     Discharge Instructions   Allergies as of 02/10/2017      Reactions   Valtrex [valacyclovir Hcl] Cough   Cough and gagging      Medication List    TAKE these medications   acetaminophen 160 MG/5ML liquid Commonly known as:  TYLENOL Take 480 mg by mouth every 4 (four) hours as needed for fever or pain.   albuterol 108 (90 Base) MCG/ACT inhaler Commonly known as:  PROVENTIL HFA Inhale 2 puffs into the lungs every 6 (six) hours as needed for wheezing or shortness of breath.   ALIGN 4 MG Caps Take 4 mg by mouth daily.   amoxicillin-clavulanate 875-125 MG tablet Commonly known as:  AUGMENTIN Take 1 tablet by mouth 2 (two) times daily.   calamine lotion Apply 1 application as needed topically for itching.   diclofenac sodium  1 % Gel Commonly known as:  VOLTAREN APPLY (2GMS) TOPICALLY THREE TIMES DAILY.   hydroxyurea 500 MG capsule Commonly known as:  HYDREA Take 1 tablet (500 mg) Monday through Friday, take 2 tablets ( 1000 mg ) Saturday  and Sunday   ipratropium-albuterol 0.5-2.5 (3) MG/3ML Soln Commonly known as:  DUONEB Take 3 mLs by nebulization every 6 (six) hours as needed. What changed:  reasons to take this   lidocaine 5 % Commonly known as:  LIDODERM Place 1 patch daily onto the skin. Remove & Discard patch within 12 hours or as directed by MD What changed:    when to take this  reasons to take this  additional instructions   methimazole 5 MG tablet Commonly known as:  TAPAZOLE TAKE 1 TABLET BY MOUTH TWICE WEEKLY   nystatin powder Commonly known as:  MYCOSTATIN/NYSTOP Apply topically 3 (three) times daily.   polyethylene glycol packet Commonly known as:  MIRALAX / GLYCOLAX TAKE 17G BY MOUTH TWICE A DAY What changed:  See the new instructions.   pregabalin 25 MG capsule Commonly known as:  LYRICA Take 1 capsule (25 mg total) by mouth 2 (two) times daily.   propranolol 10 MG tablet Commonly known as:  INDERAL Take 0.5 tablets (5 mg total) by mouth 2 (two) times daily.   PROTONIX 40 mg/20 mL Pack Generic drug:  pantoprazole sodium TAKE 20 MLS (40 MG TOTAL) BY MOUTH DAILY.   terconazole 0.4 % vaginal cream Commonly known as:  TERAZOL 7 Place 1 applicator vaginally at bedtime. What changed:    when to take this  reasons to take this   traMADol 50 MG tablet Commonly known as:  ULTRAM TAKE 2 TABLETS BY MOUTH 3 TIMES A DAY AS NEEDED What changed:  See the new instructions.   traZODone 50 MG tablet Commonly known as:  DESYREL TAKE 1 TABLET (50 MG TOTAL) BY MOUTH AT BEDTIME AS NEEDED. FOR SLEEP   triamcinolone ointment 0.5 % Commonly known as:  KENALOG Apply 1 application topically 2 (two) times daily. What changed:    when to take this  reasons to take this   warfarin 4 MG tablet Commonly known as:  COUMADIN Take as directed. If you are unsure how to take this medication, talk to your nurse or doctor. Original instructions:  TAKE AS DIRECTED BY ANTICOAGULATION CLINIC. What  changed:  See the new instructions.   warfarin 2 MG tablet Commonly known as:  COUMADIN Take as directed. If you are unsure how to take this medication, talk to your nurse or doctor. Original instructions:  Take as directed by anticoagulation clinic. What changed:    how much to take  how to take this  when to take this  additional instructions      Follow-up Information    [facility] Follow up.   Specialty:  Home Health Services Why:  [facility] will call within 48 hours after discharge home. Please call if you do not hear from [facility].  Contact information: [address] [place] [address] [phone]        Sur[facility], [place]. Call.   Specialty:  General Surgery Why:  Call if you have any questions or concerns or would like to be seen for surgical consultation.  Contact information: [place] [phone]           Consultations:  General surgery   IR  Procedures/Studies:  Ct Abdomen Pelvis W Contrast  Result Date: 02/08/2017 CLINICAL DATA:  Acute onset of worsening generalized weakness. Nausea. Elevated LFTs. Red blood cells and white blood cells in the urine. EXAM: CT ABDOMEN AND PELVIS WITH CONTRAST TECHNIQUE: Multidetector CT imaging of the abdomen and pelvis was performed using the standard protocol following bolus administration of intravenous contrast. CONTRAST:  100 mL of Isovue 300 IV contrast COMPARISON:  CT of the abdomen and pelvis performed 05/18/2016 FINDINGS: Lower chest: There is partial consolidation of the right lower lobe, which may reflect atelectasis or pneumonia. Trace bilateral pleural fluid is noted. The visualized portions of the mediastinum are grossly unremarkable. There is diffuse enhancement of collateral vessels along the left chest wall. Hepatobiliary: The liver is unremarkable in appearance. A stone is seen dependently within the gallbladder.  Gallbladder wall thickening is noted, with surrounding inflammation, raising question for mild acute cholecystitis. The common bile duct remains normal in caliber. Pancreas: The pancreas is within normal limits. Spleen: The spleen is unremarkable in appearance. Adrenals/Urinary Tract: The adrenal glands are unremarkable in appearance. The kidneys are grossly unremarkable. There is no evidence of hydronephrosis. No renal or ureteral stones are identified. No perinephric stranding is seen. Stomach/Bowel: The stomach is unremarkable in appearance. There is herniation of multiple small bowel loops into a relatively large right-sided anterior abdominal wall hernia, without evidence for obstruction or strangulation at this time. The remaining small bowel is grossly unremarkable. The appendix is not visualized; there is no evidence for appendicitis. The colon is unremarkable in appearance. There is dilatation of the rectum to 10 cm in diameter with dense stool, raising concern for fecal impaction. Vascular/Lymphatic: The abdominal aorta is unremarkable in appearance. The inferior vena cava is grossly unremarkable. No retroperitoneal lymphadenopathy is seen. No pelvic sidewall lymphadenopathy is identified. Reproductive: The bladder is mildly distended and grossly unremarkable. The uterus is diminutive and grossly unremarkable. No suspicious adnexal masses are seen. Other: There is diffuse enhancement of collateral vessels along the anterior abdominal wall. Musculoskeletal: No acute osseous abnormalities are identified. The patient's right hip arthroplasty is incompletely characterized, but appears grossly unremarkable. There is chronic loss of height at vertebral bodies L1, L2 and L3. The visualized musculature is unremarkable in appearance. IMPRESSION: 1. Gallbladder wall thickening, with surrounding soft tissue inflammation, raising concern for mild acute cholecystitis. Underlying cholelithiasis noted. Would correlate  with the patient's symptoms. 2. Partial consolidation of the right lower lung lobe, which may reflect atelectasis or pneumonia. Trace bilateral pleural fluid noted. 3. Dilatation of the rectum to 10 cm in diameter with dense stool, raising concern for fecal impaction. 4. Herniation of multiple small bowel loops into a relatively large right-sided anterior abdominal wall hernia, without evidence for obstruction or strangulation at this time. 5. Diffuse enhancement of collateral vessels along the left chest wall and anterior abdominal wall. Electronically Signed   By: Garald Balding M.D.   On: 02/08/2017 04:15   Nm Hepato W/eject Fract  Result Date: 02/08/2017 CLINICAL DATA:  Cholelithiasis EXAM: NUCLEAR MEDICINE HEPATOBILIARY IMAGING TECHNIQUE: Sequential images of the abdomen were obtained out to 60 minutes following intravenous administration of radiopharmaceutical. RADIOPHARMACEUTICALS:  7.3 mCi Tc-52m  Choletec IV COMPARISON:  CT 05/18/2016 and today FINDINGS: Prompt uptake and excretion of radiotracer by the liver. Activity is seen within small bowel by 20 min. There is non filling of the gallbladder at 60 min. Therefore, 3 milligrams of morphine was administered IV. Following morphine administration, there is partial filling of the gallbladder. IMPRESSION: Delayed  filling of the gallbladder. Only partial/slight filling of the gallbladder after morphine administration. Findings may reflect chronic cholecystitis and possible partial obstruction of the cystic duct. Electronically Signed   By: Rolm Baptise M.D.   On: 02/08/2017 15:19   Dg Chest Portable 1 View  Result Date: 02/07/2017 CLINICAL DATA:  Acute onset of altered status. Cough and shortness of breath. EXAM: PORTABLE CHEST 1 VIEW COMPARISON:  Chest radiograph performed 11/17/2016 FINDINGS: Hazy left-sided airspace opacity may reflect asymmetric pulmonary edema or pneumonia. No definite pleural effusion or pneumothorax is seen. There is persistent  marked rightward tracheal deviation, reflecting the patient's thyroid goiter. The cardiomediastinal silhouette is mildly enlarged. No acute osseous abnormalities are seen. There is chronic superior subluxation of the left humeral head. IMPRESSION: Hazy left-sided airspace opacity may reflect asymmetric pulmonary edema or pneumonia. Mild cardiomegaly. Electronically Signed   By: Garald Balding M.D.   On: 02/07/2017 23:12   Dg Shoulder Right Port  Result Date: 02/09/2017 CLINICAL DATA:  Generalize right shoulder pain and swelling with no known injury. History of an intrathoracic goiter. EXAM: PORTABLE RIGHT SHOULDER COMPARISON:  Limited views of the right shoulder from a chest x-ray of February 07, 2017 FINDINGS: The bones are subjectively osteopenic. No acute fracture is observed. There is soft tissue calcifications in the subacromial subdeltoid space. There is calcification of the articular cartilage of the humeral head. Osteophytes arise from the articular margins of the humeral head. There is a large soft tissue mass with displacement of the trachea toward the right consistent with a known intrathoracic border. IMPRESSION: No acute fracture or dislocation of the right shoulder. There are degenerative changes with soft tissue calcifications associated with the glenohumeral joint. Very large intrathoracic ordered displacing the trachea toward the right. Electronically Signed   By: David  Martinique M.D.   On: 02/09/2017 08:48   Vas [place] Upper Extremity Venous Duplex  Result Date: 02/08/2017 UPPER VENOUS STUDY  Indications: Edema Examination Guidelines: A complete evaluation includes B-mode imaging, spectral doppler, color doppler, and power doppler as needed of all accessible portions of each vessel. Bilateral testing is considered an integral part of a complete examination. Limited examinations for reoccurring indications may be performed as noted. Findings:  +----------+-----------+-----------+------------+------------+-------+---------+ RIGHT       Patency   Thrombus  CompressibleSpontaneous Phasic Augmented +----------+-----------+-----------+------------+------------+-------+---------+ IJV        partially  brightly       no     Spontaneous.Phasic.                     occluded   echogenic                                          +----------+-----------+-----------+------------+------------+-------+---------+ Subclavian  patent                  yes     Spontaneous.Phasic.          +----------+-----------+-----------+------------+------------+-------+---------+ Axillary    patent                  yes     Spontaneous.Phasic.          +----------+-----------+-----------+------------+------------+-------+---------+ Brachial    patent                  yes     Spontaneous.Phasic.          +----------+-----------+-----------+------------+------------+-------+---------+ Radial  patent                  yes                                  +----------+-----------+-----------+------------+------------+-------+---------+ Ulnar       patent                  yes                                  +----------+-----------+-----------+------------+------------+-------+---------+ Cephalic    patent                  yes                                  +----------+-----------+-----------+------------+------------+-------+---------+ Basilic     patent                  yes                                  +----------+-----------+-----------+------------+------------+-------+---------+  Final Interpretation: No evidence of deep vein or superficial vein thrombosis involving the right upper extremity.  *See table(s) above for measurements and observations.  Servando Snare Electronically signed by Servando Snare on 02/08/2017 at 5:35:26 PM.  Final     Subjective: - no chest pain, shortness of breath, no abdominal  pain, nausea or vomiting.   Discharge Exam: Vitals:   02/10/17 0517 02/10/17 1030  BP:    Pulse:    Resp:    Temp:    SpO2: 94% 98%    General: Pt is alert, awake, not in acute distress Cardiovascular: RRR, S1/S2 +, no rubs, no gallops Respiratory: CTA bilaterally, no wheezing, no rhonchi Abdominal: Soft, NT, ND, bowel sounds + Extremities: no edema, no cyanosis    The results of significant diagnostics from this hospitalization (including imaging, microbiology, ancillary and laboratory) are listed below for reference.     Microbiology: Recent Results (from the past 240 hour(s))  Culture, blood (x 2)     Status: None (Preliminary result)   Collection Time: 02/08/17  6:31 AM  Result Value Ref Range Status   Specimen Description   Final    BLOOD LEFT ARM Performed at [facility] [address], [place], [place] [address]    Special Requests   Final    IN PEDIATRIC BOTTLE Blood Culture adequate volume Performed at [facility] [address], [place], [place] [address]    Culture   Final    NO GROWTH 1 DAY Performed at [facility], [place] [address], [place], [place] [address]    Report Status PENDING  Incomplete  Culture, blood (x 2)     Status: None (Preliminary result)   Collection Time: 02/08/17  6:31 AM  Result Value Ref Range Status   Specimen Description   Final    BLOOD LEFT ARM Performed at [facility] [address], [place], [place] [address]    Special Requests   Final    IN PEDIATRIC BOTTLE Blood Culture adequate volume Performed at [facility] [address], [place],  [address]    Culture  Final    NO GROWTH 1 DAY Performed at [facility], [place] [address], [place], [place] [address]    Report Status PENDING  Incomplete  MRSA PCR Screening     Status: Abnormal   Collection Time: 02/08/17  3:41 PM  Result Value Ref Range Status   MRSA by PCR  POSITIVE (A) NEGATIVE Final    Comment:        The GeneXpert MRSA Assay (FDA approved for NASAL specimens only), is one component of a comprehensive MRSA colonization surveillance program. It is not intended to diagnose MRSA infection nor to guide or monitor treatment for MRSA infections. RESULT CALLED TO, READ BACK BY AND VERIFIED WITHLoletha Grayer PHILLIPS,RN [id number] @ [id number] BY J SCOTTON Performed at [facility], [place] [address] [place], [place] [address]      Labs: BNP (last 3 results) No results for input(s): BNP in the last 8760 hours. Basic Metabolic Panel: Recent Labs  Lab 02/07/17 2319 02/08/17 0631 02/09/17 0440 02/10/17 0434  NA 141 141 142  --   K 3.5 3.6 3.5  --   CL 109 110 111  --   CO2 27 24 23   --   GLUCOSE 134* 119* 93  --   BUN 14 13 9   --   CREATININE 0.41* 0.35* <0.30* <0.30*  CALCIUM 8.8* 8.5* 8.2*  --    Liver Function Tests: Recent Labs  Lab 02/07/17 2319 02/08/17 0631 02/09/17 0440  AST 146* 87*  92* 39  ALT 252* 197*  198* 122*  ALKPHOS 154* 131*  129* 104  BILITOT 4.7* 2.9*  2.8* 2.1*  PROT 6.3* 5.7*  5.8* 5.0*  ALBUMIN 3.1* 2.8*  2.9* 2.6*   No results for input(s): LIPASE, AMYLASE in the last 168 hours. No results for input(s): AMMONIA in the last 168 hours. CBC: Recent Labs  Lab 02/07/17 2319 02/08/17 0631 02/09/17 0529  WBC 12.1* 11.4* 8.4  NEUTROABS 10.9* 10.0*  --   HGB 13.9 12.8 12.2  HCT 42.1 38.7 37.7  MCV 116.0* 115.5* 117.8*  PLT 415* 392 388   Cardiac Enzymes: No results for input(s): CKTOTAL, CKMB, CKMBINDEX, TROPONINI in the last 168 hours. BNP: Invalid input(s): POCBNP CBG: Recent Labs  Lab 02/08/17 2228 02/09/17 0757 02/09/17 1557 02/09/17 2312 02/10/17 0805  GLUCAP 99 82 76 107* 89   D-Dimer No results for input(s): DDIMER in the last 72 hours. Hgb A1c No results for input(s): HGBA1C in the last 72 hours. Lipid Profile No results for input(s): CHOL, HDL, LDLCALC, TRIG, CHOLHDL,  LDLDIRECT in the last 72 hours. Thyroid function studies No results for input(s): TSH, T4TOTAL, T3FREE, THYROIDAB in the last 72 hours.  Invalid input(s): FREET3 Anemia work up No results for input(s): VITAMINB12, FOLATE, FERRITIN, TIBC, IRON, RETICCTPCT in the last 72 hours. Urinalysis    Component Value Date/Time   COLORURINE AMBER (A) 02/08/2017 0015   APPEARANCEUR CLEAR 02/08/2017 0015   LABSPEC 1.023 02/08/2017 0015   PHURINE 5.0 02/08/2017 0015   GLUCOSEU NEGATIVE 02/08/2017 0015   GLUCOSEU NEGATIVE 08/22/2014 1700   HGBUR MODERATE (A) 02/08/2017 0015   BILIRUBINUR SMALL (A) 02/08/2017 0015   KETONESUR NEGATIVE 02/08/2017 0015   PROTEINUR 30 (A) 02/08/2017 0015   UROBILINOGEN 0.2 08/22/2014 1700   NITRITE NEGATIVE 02/08/2017 0015   LEUKOCYTESUR MODERATE (A) 02/08/2017 0015   Sepsis Labs Invalid input(s): PROCALCITONIN,  WBC,  LACTICIDVEN   Time coordinating discharge: 32 minutes  SIGNED:  Marzetta Board, MD  [facility] 02/10/2017, 3:34 PM Pager [phone]  If 7PM-7AM, please contact night-coverage [email or web address] Password [email or web address]

## 2017-02-10 NOTE — Telephone Encounter (Signed)
Would it be possible for her to admit her for 3 days? I arranged for this for 1 of my pts in the past, but need time to do this, I don't think we can do this on the spot.Marland KitchenMarland Kitchen

## 2017-02-10 NOTE — Progress Notes (Signed)
Called and discussed with daughter, [name] [phone]) Nuclear medicine cannot perform RAI treatment on her mother as she is wearing diapers and will not be able to properly dispose of them at home.  Therefore, the only option is to have the patient admitted to the hospital/short stay.  I explained that, since she requires frequent changes and she needs help with all her activities, a 3-day hospital stay may not be approved since it puts the staff at increased risk for radiation exposure.  Daughter would like to know whether it is possible for her to perform these activities.  She is aware that she needs to stay at a certain distance from her mom in the rest of the situations.  I will discuss with the radiation safety officer and let the daughter know.

## 2017-02-10 NOTE — Progress Notes (Signed)
[facility] called for transportation. RN and pt's daughter aware pick up at 1230 PM.

## 2017-02-11 ENCOUNTER — Ambulatory Visit (HOSPITAL_COMMUNITY): Admission: RE | Admit: 2017-02-11 | Payer: Medicare Other | Source: Ambulatory Visit

## 2017-02-11 LAB — LEGIONELLA PNEUMOPHILA SEROGP 1 UR AG: L. pneumophila Serogp 1 Ur Ag: NEGATIVE

## 2017-02-12 ENCOUNTER — Other Ambulatory Visit: Payer: Self-pay | Admitting: Internal Medicine

## 2017-02-12 ENCOUNTER — Telehealth: Payer: Self-pay | Admitting: *Deleted

## 2017-02-12 ENCOUNTER — Emergency Department (HOSPITAL_COMMUNITY): Payer: Medicare Other

## 2017-02-12 ENCOUNTER — Encounter (HOSPITAL_COMMUNITY): Payer: Self-pay | Admitting: Emergency Medicine

## 2017-02-12 ENCOUNTER — Other Ambulatory Visit: Payer: Self-pay

## 2017-02-12 ENCOUNTER — Ambulatory Visit: Payer: Self-pay | Admitting: *Deleted

## 2017-02-12 ENCOUNTER — Inpatient Hospital Stay (HOSPITAL_COMMUNITY)
Admission: EM | Admit: 2017-02-12 | Discharge: 2017-02-17 | DRG: 177 | Disposition: A | Discharge status: Home Health | Payer: Medicare Other | Attending: Internal Medicine | Admitting: Internal Medicine

## 2017-02-12 DIAGNOSIS — I69351 Hemiplegia and hemiparesis following cerebral infarction affecting right dominant side: Secondary | ICD-10-CM

## 2017-02-12 DIAGNOSIS — Z86718 Personal history of other venous thrombosis and embolism: Secondary | ICD-10-CM | POA: Diagnosis not present

## 2017-02-12 DIAGNOSIS — Z79899 Other long term (current) drug therapy: Secondary | ICD-10-CM

## 2017-02-12 DIAGNOSIS — J9621 Acute and chronic respiratory failure with hypoxia: Secondary | ICD-10-CM | POA: Diagnosis not present

## 2017-02-12 DIAGNOSIS — R0602 Shortness of breath: Secondary | ICD-10-CM | POA: Diagnosis not present

## 2017-02-12 DIAGNOSIS — R627 Adult failure to thrive: Secondary | ICD-10-CM | POA: Diagnosis present

## 2017-02-12 DIAGNOSIS — E876 Hypokalemia: Secondary | ICD-10-CM | POA: Diagnosis not present

## 2017-02-12 DIAGNOSIS — R9431 Abnormal electrocardiogram [ECG] [EKG]: Secondary | ICD-10-CM | POA: Diagnosis not present

## 2017-02-12 DIAGNOSIS — Z86711 Personal history of pulmonary embolism: Secondary | ICD-10-CM | POA: Diagnosis not present

## 2017-02-12 DIAGNOSIS — I10 Essential (primary) hypertension: Secondary | ICD-10-CM | POA: Diagnosis present

## 2017-02-12 DIAGNOSIS — J80 Acute respiratory distress syndrome: Secondary | ICD-10-CM | POA: Diagnosis not present

## 2017-02-12 DIAGNOSIS — E43 Unspecified severe protein-calorie malnutrition: Secondary | ICD-10-CM | POA: Diagnosis present

## 2017-02-12 DIAGNOSIS — R778 Other specified abnormalities of plasma proteins: Secondary | ICD-10-CM | POA: Insufficient documentation

## 2017-02-12 DIAGNOSIS — I69320 Aphasia following cerebral infarction: Secondary | ICD-10-CM | POA: Diagnosis not present

## 2017-02-12 DIAGNOSIS — J189 Pneumonia, unspecified organism: Secondary | ICD-10-CM | POA: Diagnosis not present

## 2017-02-12 DIAGNOSIS — D473 Essential (hemorrhagic) thrombocythemia: Secondary | ICD-10-CM | POA: Diagnosis present

## 2017-02-12 DIAGNOSIS — R4701 Aphasia: Secondary | ICD-10-CM | POA: Diagnosis not present

## 2017-02-12 DIAGNOSIS — Z6833 Body mass index (BMI) 33.0-33.9, adult: Secondary | ICD-10-CM

## 2017-02-12 DIAGNOSIS — K219 Gastro-esophageal reflux disease without esophagitis: Secondary | ICD-10-CM | POA: Diagnosis present

## 2017-02-12 DIAGNOSIS — Z7901 Long term (current) use of anticoagulants: Secondary | ICD-10-CM

## 2017-02-12 DIAGNOSIS — Z96651 Presence of right artificial knee joint: Secondary | ICD-10-CM | POA: Diagnosis present

## 2017-02-12 DIAGNOSIS — R748 Abnormal levels of other serum enzymes: Secondary | ICD-10-CM | POA: Diagnosis not present

## 2017-02-12 DIAGNOSIS — Z90710 Acquired absence of both cervix and uterus: Secondary | ICD-10-CM | POA: Diagnosis not present

## 2017-02-12 DIAGNOSIS — J690 Pneumonitis due to inhalation of food and vomit: Principal | ICD-10-CM | POA: Diagnosis present

## 2017-02-12 DIAGNOSIS — R0600 Dyspnea, unspecified: Secondary | ICD-10-CM | POA: Diagnosis not present

## 2017-02-12 DIAGNOSIS — I6389 Other cerebral infarction: Secondary | ICD-10-CM | POA: Diagnosis not present

## 2017-02-12 DIAGNOSIS — R7989 Other specified abnormal findings of blood chemistry: Secondary | ICD-10-CM | POA: Diagnosis not present

## 2017-02-12 DIAGNOSIS — E049 Nontoxic goiter, unspecified: Secondary | ICD-10-CM | POA: Diagnosis present

## 2017-02-12 DIAGNOSIS — E0520 Thyrotoxicosis with toxic multinodular goiter without thyrotoxic crisis or storm: Secondary | ICD-10-CM | POA: Diagnosis present

## 2017-02-12 DIAGNOSIS — F4310 Post-traumatic stress disorder, unspecified: Secondary | ICD-10-CM | POA: Diagnosis present

## 2017-02-12 DIAGNOSIS — R4781 Slurred speech: Secondary | ICD-10-CM | POA: Diagnosis not present

## 2017-02-12 DIAGNOSIS — E669 Obesity, unspecified: Secondary | ICD-10-CM | POA: Diagnosis present

## 2017-02-12 DIAGNOSIS — G8191 Hemiplegia, unspecified affecting right dominant side: Secondary | ICD-10-CM | POA: Diagnosis not present

## 2017-02-12 DIAGNOSIS — Z993 Dependence on wheelchair: Secondary | ICD-10-CM | POA: Diagnosis not present

## 2017-02-12 DIAGNOSIS — I361 Nonrheumatic tricuspid (valve) insufficiency: Secondary | ICD-10-CM | POA: Diagnosis not present

## 2017-02-12 DIAGNOSIS — A419 Sepsis, unspecified organism: Secondary | ICD-10-CM | POA: Diagnosis present

## 2017-02-12 DIAGNOSIS — I6789 Other cerebrovascular disease: Secondary | ICD-10-CM | POA: Diagnosis not present

## 2017-02-12 DIAGNOSIS — I5043 Acute on chronic combined systolic (congestive) and diastolic (congestive) heart failure: Secondary | ICD-10-CM | POA: Diagnosis not present

## 2017-02-12 HISTORY — DX: Essential (hemorrhagic) thrombocythemia: D47.3

## 2017-02-12 LAB — HEPATIC FUNCTION PANEL
ALT: 43 U/L (ref 14–54)
AST: 18 U/L (ref 15–41)
Albumin: 2.7 g/dL — ABNORMAL LOW (ref 3.5–5.0)
Alkaline Phosphatase: 87 U/L (ref 38–126)
Bilirubin, Direct: 0.3 mg/dL (ref 0.1–0.5)
Indirect Bilirubin: 0.6 mg/dL (ref 0.3–0.9)
Total Bilirubin: 0.9 mg/dL (ref 0.3–1.2)
Total Protein: 5.7 g/dL — ABNORMAL LOW (ref 6.5–8.1)

## 2017-02-12 LAB — INFLUENZA PANEL BY PCR (TYPE A & B)
Influenza A By PCR: NEGATIVE
Influenza B By PCR: NEGATIVE

## 2017-02-12 LAB — BASIC METABOLIC PANEL
Anion gap: 6 (ref 5–15)
BUN: 9 mg/dL (ref 6–20)
CO2: 29 mmol/L (ref 22–32)
Calcium: 8.4 mg/dL — ABNORMAL LOW (ref 8.9–10.3)
Chloride: 108 mmol/L (ref 101–111)
Creatinine, Ser: 0.32 mg/dL — ABNORMAL LOW (ref 0.44–1.00)
GFR calc Af Amer: >60 mL/min (ref >60)
GFR calc non Af Amer: >60 mL/min (ref >60)
Glucose, Bld: 102 mg/dL — ABNORMAL HIGH (ref 65–99)
Potassium: 3.4 mmol/L — ABNORMAL LOW (ref 3.5–5.1)
Sodium: 143 mmol/L (ref 135–145)

## 2017-02-12 LAB — CBC WITH DIFFERENTIAL/PLATELET
Basophils Absolute: 0 10*3/uL (ref 0.0–0.1)
Basophils Relative: 0 %
Eosinophils Absolute: 0.1 10*3/uL (ref 0.0–0.7)
Eosinophils Relative: 1 %
HCT: 40.1 % (ref 36.0–46.0)
Hemoglobin: 13 g/dL (ref 12.0–15.0)
Lymphocytes Relative: 10 %
Lymphs Abs: 0.9 10*3/uL (ref 0.7–4.0)
MCH: 37.8 pg — ABNORMAL HIGH (ref 26.0–34.0)
MCHC: 32.4 g/dL (ref 30.0–36.0)
MCV: 116.6 fL — ABNORMAL HIGH (ref 78.0–100.0)
Monocytes Absolute: 0.8 10*3/uL (ref 0.1–1.0)
Monocytes Relative: 9 %
Neutro Abs: 7.4 10*3/uL (ref 1.7–7.7)
Neutrophils Relative %: 80 %
Platelets: 412 10*3/uL — ABNORMAL HIGH (ref 150–400)
RBC: 3.44 MIL/uL — ABNORMAL LOW (ref 3.87–5.11)
RDW: 15.4 % (ref 11.5–15.5)
WBC: 9.2 10*3/uL (ref 4.0–10.5)

## 2017-02-12 LAB — PROTIME-INR
INR: 2.94
Prothrombin Time: 30.4 seconds — ABNORMAL HIGH (ref 11.4–15.2)

## 2017-02-12 LAB — TROPONIN I: Troponin I: 0.1 ng/mL (ref <0.03)

## 2017-02-12 LAB — BRAIN NATRIURETIC PEPTIDE: B Natriuretic Peptide: 899.5 pg/mL — ABNORMAL HIGH (ref 0.0–100.0)

## 2017-02-12 LAB — I-STAT CG4 LACTIC ACID, ED: Lactic Acid, Venous: 1.15 mmol/L (ref 0.5–1.9)

## 2017-02-12 MED ORDER — WARFARIN - PHARMACIST DOSING INPATIENT
Freq: Every day | Status: DC
  Administered 2017-02-16: 18:00:00

## 2017-02-12 MED ORDER — PANTOPRAZOLE SODIUM 40 MG PO PACK
40.0000 mg | PACK | Freq: Every day | ORAL | Status: DC
  Administered 2017-02-13 – 2017-02-17 (×5): 40 mg via ORAL
  Filled 2017-02-12 (×6): qty 20

## 2017-02-12 MED ORDER — HYDROXYUREA 500 MG PO CAPS: 500.0000 mg | ORAL_CAPSULE | Freq: Every day | ORAL | Status: DC

## 2017-02-12 MED ORDER — DEXTROSE 5 % IV SOLN
1.0000 g | Freq: Three times a day (TID) | INTRAVENOUS | Status: DC
  Administered 2017-02-13 – 2017-02-15 (×7): 1 g via INTRAVENOUS
  Filled 2017-02-12 (×9): qty 1

## 2017-02-12 MED ORDER — TRAMADOL HCL 50 MG PO TABS
50.0000 mg | ORAL_TABLET | Freq: Three times a day (TID) | ORAL | Status: DC | PRN
  Administered 2017-02-12 – 2017-02-17 (×9): 50 mg via ORAL
  Filled 2017-02-12 (×11): qty 1

## 2017-02-12 MED ORDER — IPRATROPIUM-ALBUTEROL 0.5-2.5 (3) MG/3ML IN SOLN: 3.0000 mL | Freq: Four times a day (QID) | RESPIRATORY_TRACT | Status: DC | PRN

## 2017-02-12 MED ORDER — HYDROXYUREA 500 MG PO CAPS
500.0000 mg | ORAL_CAPSULE | ORAL | Status: DC
  Administered 2017-02-12 – 2017-02-17 (×4): 500 mg via ORAL
  Filled 2017-02-12 (×3): qty 1

## 2017-02-12 MED ORDER — WARFARIN SODIUM 2 MG PO TABS
2.0000 mg | ORAL_TABLET | Freq: Once | ORAL | Status: AC
  Administered 2017-02-12: 2 mg via ORAL
  Filled 2017-02-12: qty 1

## 2017-02-12 MED ORDER — METHIMAZOLE 5 MG PO TABS
5.0000 mg | ORAL_TABLET | ORAL | Status: DC
  Administered 2017-02-14: 5 mg via ORAL
  Filled 2017-02-12: qty 1

## 2017-02-12 MED ORDER — RISAQUAD PO CAPS
1.0000 | ORAL_CAPSULE | Freq: Every day | ORAL | Status: DC
  Administered 2017-02-13 – 2017-02-17 (×5): 1 via ORAL
  Filled 2017-02-12 (×6): qty 1

## 2017-02-12 MED ORDER — PROPRANOLOL HCL 10 MG PO TABS
5.0000 mg | ORAL_TABLET | Freq: Two times a day (BID) | ORAL | Status: DC
  Administered 2017-02-12 – 2017-02-17 (×10): 5 mg via ORAL
  Filled 2017-02-12 (×6): qty 1
  Filled 2017-02-12: qty 0.5
  Filled 2017-02-12 (×3): qty 1
  Filled 2017-02-12: qty 0.5

## 2017-02-12 MED ORDER — POLYETHYLENE GLYCOL 3350 17 G PO PACK
17.0000 g | PACK | Freq: Two times a day (BID) | ORAL | Status: DC
  Administered 2017-02-12 – 2017-02-16 (×9): 17 g via ORAL
  Filled 2017-02-12 (×9): qty 1

## 2017-02-12 MED ORDER — TRAZODONE HCL 50 MG PO TABS
50.0000 mg | ORAL_TABLET | Freq: Every evening | ORAL | Status: DC | PRN
  Administered 2017-02-12 – 2017-02-15 (×3): 50 mg via ORAL
  Filled 2017-02-12 (×4): qty 1

## 2017-02-12 MED ORDER — PREGABALIN 25 MG PO CAPS
25.0000 mg | ORAL_CAPSULE | Freq: Two times a day (BID) | ORAL | Status: DC
  Administered 2017-02-12 – 2017-02-17 (×10): 25 mg via ORAL
  Filled 2017-02-12 (×10): qty 1

## 2017-02-12 MED ORDER — PIPERACILLIN-TAZOBACTAM 3.375 G IVPB 30 MIN
3.3750 g | Freq: Once | INTRAVENOUS | Status: AC
  Administered 2017-02-12: 3.375 g via INTRAVENOUS
  Filled 2017-02-12: qty 50

## 2017-02-12 MED ORDER — SODIUM CHLORIDE 0.9 % IV SOLN
INTRAVENOUS | Status: AC
  Administered 2017-02-12 – 2017-02-13 (×2): via INTRAVENOUS

## 2017-02-12 MED ORDER — SODIUM CHLORIDE 0.9 % IV SOLN
1500.0000 mg | Freq: Once | INTRAVENOUS | Status: AC
  Administered 2017-02-12: 1500 mg via INTRAVENOUS
  Filled 2017-02-12: qty 1500

## 2017-02-12 MED ORDER — HYDROXYUREA 500 MG PO CAPS
1000.0000 mg | ORAL_CAPSULE | ORAL | Status: DC
  Administered 2017-02-13 – 2017-02-14 (×2): 1000 mg via ORAL
  Filled 2017-02-12 (×3): qty 2

## 2017-02-12 MED ORDER — VANCOMYCIN HCL IN DEXTROSE 750-5 MG/150ML-% IV SOLN
750.0000 mg | INTRAVENOUS | Status: DC
  Administered 2017-02-13 – 2017-02-14 (×2): 750 mg via INTRAVENOUS
  Filled 2017-02-12 (×3): qty 150

## 2017-02-12 NOTE — Telephone Encounter (Signed)
After speaking with Dr. Quay Burow.  Patient is advised to go back to the ED.  I have spoken with patients daughter.  She expressed understanding and stated she would take patient back to the ED.

## 2017-02-12 NOTE — Progress Notes (Signed)
Pharmacy Antibiotic Note  Sandy Barrett is a 84 y.o. female admitted on 02/12/2017 with pneumonia.  Pharmacy has been consulted for vancomycin dosing. Cefepime order per MD.  Recent admission for cholecystitis (managed conservatively with antibiotics [augmentin]) and aspiration pneumonia  Plan:  Vancomycin 1500mg  IV x 1 then 750mg  IV q24h  Cefepime 1gm IV q8h  Monitor SCr  Check vancomycin levels as indicated  F/u microbiology results and ability to narrow antibiotics   Temp (24hrs), Avg:97.6 F (36.4 C), Min:97.1 F (36.2 C), Max:98 F (36.7 C)  Recent Labs  Lab 02/07/17 2319 02/07/17 2341 02/08/17 0631 02/09/17 0440 02/09/17 0529 02/10/17 0434 02/12/17 1528 02/12/17 1531  WBC 12.1*  --  11.4*  --  8.4  --   --  9.2  CREATININE 0.41*  --  0.35* <0.30*  --  <0.30*  --  0.32*  LATICACIDVEN  --  1.90 0.8  --   --   --  1.15  --     Estimated Creatinine Clearance: 50.2 mL/min (A) (by C-G formula based on SCr of 0.32 mg/dL (L)).    Allergies  Allergen Reactions  . Valtrex [Valacyclovir Hcl] Cough    Cough and gagging    Antimicrobials this admission: 2/8 vanco >> 2/8 zosyn x1 2/8 cefepime  >>  Dose adjustments this admission:  Microbiology results: 2/8 BCx:  2/8 flu panel: neg 2/8 strep Ag: 2/8 legionella Ag:   Sputum: ordered  2/4 MRSA PCR: positive 2/4 BCx; NG  Thank you for allowing pharmacy to be a part of this patient's care.  Doreene Eland, PharmD, BCPS.   Pager: [phone] 02/12/2017 8:25 PM

## 2017-02-12 NOTE — ED Notes (Signed)
Per Dr Darl Householder- patient can eat. Regular diet

## 2017-02-12 NOTE — Telephone Encounter (Signed)
[place] Controlled Substance Database checked. Last filled on 01/03/17

## 2017-02-12 NOTE — ED Notes (Signed)
ATTEMPTED TWICE WITH [place]. NOT SUCCESSFUL. EDP [facility] REQUESTED TO ASSIST.

## 2017-02-12 NOTE — Telephone Encounter (Signed)
  Patient's daughter phoned to report her mother was discharged from [facility] on Wednesday. She was diagnosed with Pneumonia in her Lt. Lung and began taking Augmentin yesterday.  She is reporting her mother's breathing began to be labored yesterday and had fever yesterday of 100 degrees oral. She did do a duoneb treatment earlier this morning.  The patient has had a stroke in the past that affected her Rt. Side. Daughter, [name] is now reporting Sandy Barrett left arm has begun to swell and her skin feels clammy since leaving the hospital. Today's temp is 98 degrees, b/p102/57, hr 106 bpm, resp are 24. She said her mother is blowing into the straw instead of sucking in the liquid.  Dorie Rank Dr. Quay Burow would get this note and RN would be in touch.    Answer Assessment - Initial Assessment Questions 1. SYMPTOM: "What's the main symptom you're concerned about?" (e.g., breathing difficulty, fever, weakness)     Labored breathing . Temp yesterday was 100 degrees. 98 this morning. 2. ONSET: "When did the  ________  start?"   Yesterday, the day after she was discharged from [facility] with pneumonia. 3. BETTER-SAME-WORSE: "Are you getting better, staying the same, or getting worse compared to the day you were discharged?"    A little worse as far as her breathing. 4. HOSPITALIZATION: "How long were you hospitalized?" (e.g., days)    3 days 5. DISCHARGE DATE: "What date were you discharged from the hospital?"     02/10/17 6. DISCHARGE DOCTOR: "Who is the main doctor taking care of you now?"     Gerkey 7. DISCHARGE APPOINTMENT: "Have you scheduled a follow-up discharge appointment with your doctor?" no 8. DISCHARGE MEDICATIONS: "Did the physician who discharged you order any new medications for you to use? If yes, have you filled the prescription and started taking the medication?"     yes 9. DISCHARGE ANTIBIOTICS: "Are you taking any antibiotic medication now?"     Started  Augmentin yesterday  10. BREATHING DIFFICULTY: "Are you having any difficulty breathing?" If so, ask "How bad is it?"  (e.g., none, mild, moderate, severe)   - MILD: No SOB at rest, mild SOB with walking, speaks normally in sentences, can lay down, no retractions, pulse < 100.    - MODERATE: SOB at rest, SOB with minimal exertion and prefers to sit, cannot lie down flat, speaks in phrases, mild retractions, audible wheezing, pulse 100-120.    - SEVERE: Very SOB at rest, speaks in single words, struggling to breathe, sitting hunched forward, retractions, pulse > 120       moderate 11. FEVER: "Do you have a fever?" If so, ask: "What is it, how was it measured and when did it start?"       Temp 100 degrees yesterday, 98 degrees this morning 12. OTHER SYMPTOMS: "Do you have any other symptoms?" (e.g., weakness, confusion, pain)      She has had a stroke in the past and does not have use of her Rt. Side.  Protocols used: PNEUMONIA ON ANTIBIOTIC POST-HOSPITALIZATION FOLLOW-UP CALL-A-AH

## 2017-02-12 NOTE — ED Provider Notes (Signed)
  Physical Exam  BP (!) 136/102 (BP Location: Left Arm)   Pulse 90   Temp 98 F (36.7 C) (Oral)   Resp 20   SpO2 94%   Physical Exam  ED Course/Procedures     Procedures  MDM  Patient care assumed at 4 pm. Patient had recent admission for aspiration pneumonia, cholecystitis and was managed with IV antibiotics and no surgery or drain.  Patient went home 2 days ago and has shortness of breath yesterday.  She was hypoxic on arrival.  Chest x-ray showed recurrent pneumonia and signed out pending labs and admission.  6:34 PM Labs showed nl WBC. Neg flu. BNP 899, trop 0.1, likely demand ischemia from hypoxia. Dr. Wilson Singer ordered vanc/zosyn. Held IVF due to increased BNP. Will admit for aspiration pneumonia with hypoxia, demand ischemia.     Drenda Freeze, MD 02/12/17 [phone]

## 2017-02-12 NOTE — Telephone Encounter (Signed)
LMTCB

## 2017-02-12 NOTE — ED Notes (Addendum)
Simla ATTEMPTED [place] IV. TWICE. ESTABLISHED ONCE HOWEVER ABE TO GET BLOOD ONLY. CONSULT IV ORDER. PT AND FAMILY TOLERATED AND AWARE OF DELAY.

## 2017-02-12 NOTE — ED Notes (Signed)
Bed: [facility] Expected date:  Expected time:  Means of arrival:  Comments: EMS-low O2-room 9

## 2017-02-12 NOTE — ED Notes (Signed)
RN notified of abnormal lab 

## 2017-02-12 NOTE — ED Triage Notes (Signed)
Per EMS pt complaint of SOB; denies pain. New swelling to left arm; right side swelling is chronic. Recently seen for n/v. Concern for aspiration.

## 2017-02-12 NOTE — H&P (Addendum)
TRH H&P   Patient Demographics:    Sandy Barrett, is a 84 y.o. female  MRN: [id number]   DOB - May 11, 1932  Admit Date - 02/12/2017  Outpatient Primary MD for the patient is [name], MD  Referring MD/NP/PA:   Shirlyn Goltz  Outpatient Specialists:    [facility]    Patient coming from:  home  Chief Complaint  Patient presents with  . Shortness of Breath      HPI:    Sandy Barrett  is a 84 y.o. 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 Pt states fever started last nite and has dry cough  then had more difficulty with congestion, and dyspnea today and therefore presented to ED.    In ED,  CXR IMPRESSION: 1. Marked thyroid enlargement with marked deviation of the thoracic trachea toward the right with thoracic tracheal narrowing, stable.  2. Patchy consolidation both lung bases. A degree of bibasilar pneumonia must be of concern.  3.  Small left pleural effusion.  4.  Stable cardiac prominence.  5.  Small granulomas left upper lobe.  6.  Aortic atherosclerosis.  7.  Bones osteoporotic.  La 1.15 Wbc 9.2, Hgb 13.0, Plt 412 mcv 116.6 Na 143, K 3.4 Bun 9, Creatinine 0.32 BNP 899.5 Trop 0.10 INR 2.94  Alb 2.7 Ast 18, Alt 43 Alk phos 87, T. Bili 0.9  Influenza negative  Pt will be admitted for pneumonia, Hcap, troponin elevation, and severe protein calorie malnutrition.          Review of systems:    In addition to the HPI above,   No Headache, No changes with Vision or hearing, No problems swallowing food or Liquids, No Chest pain,  No Abdominal pain, No Nausea or Vommitting, Bowel movements are regular, No Blood in stool or Urine, No dysuria, No new skin rashes or bruises, No new joints pains-aches,  No new  weakness, tingling, numbness in any extremity, No recent weight gain or loss, No polyuria, polydypsia or polyphagia, No significant Mental Stressors.  A full 10 point Review of Systems was done, except as stated above, all other Review of Systems were negative.   With Past History of the following :    Past Medical History:  Diagnosis Date  . ARTHRITIS   . Arthritis   . Diverticulosis   . HYPERTENSION   . HYPERTHYROIDISM   . Hyperthyroidism    s/p I-131 ablation 03/2011 of multinod goiter  . INSOMNIA   . MIGRAINE HEADACHE   . OBESITY   . Posttraumatic stress disorder   . Pulmonary embolism ([place]) 03/2014   with DVT  . Stroke [facility]) 03/2014   dysarthria      Past Surgical History:  Procedure Laterality Date  . ABDOMINAL HYSTERECTOMY  1976  .  BREAST SURGERY     biopsy  . RADIOLOGY WITH ANESTHESIA Left 03/08/2014   Procedure: RADIOLOGY WITH ANESTHESIA;  Surgeon: Rob Hickman, MD;  Location: [facility];  Service: Radiology;  Laterality: Left;  . TONSILLECTOMY    . TOTAL HIP ARTHROPLASTY  1998    right      Social History:     Social History   Tobacco Use  . Smoking status: Never Smoker  . Smokeless tobacco: Never Used  Substance Use Topics  . Alcohol use: No    Alcohol/week: 0.0 oz     Lives - at home w her daughter  Mobility - wheel chair bound after stroke   Family History :     Family History  Problem Relation Age of Onset  . Asthma Mother   . Asthma Father   . Prostate cancer Father   . Stroke Brother   . Breast cancer Sister   . Stomach cancer Sister   . Thyroid disease Neg Hx       Home Medications:   Prior to Admission medications   Medication Sig Start Date End Date Taking? Authorizing Provider  acetaminophen (TYLENOL) 160 MG/5ML liquid Take 480 mg by mouth every 4 (four) hours as needed for fever or pain.   Yes [provider]  amoxicillin-clavulanate (AUGMENTIN) 875-125 MG tablet Take 1 tablet by mouth 2 (two) times daily.  02/10/17  Yes Caren Griffins, MD  calamine lotion Apply 1 application as needed topically for itching. 11/09/16  Yes Nche, Charlene Brooke, NP  diclofenac sodium (VOLTAREN) 1 % GEL APPLY (2GMS) TOPICALLY THREE TIMES DAILY. 12/21/16  Yes [name], MD  hydroxyurea (HYDREA) 500 MG capsule Take 1 tablet (500 mg) Monday through Friday, take 2 tablets ( 1000 mg ) Saturday and Sunday 10/06/16  Yes Gorsuch, Ni, MD  ipratropium-albuterol (DUONEB) 0.5-2.5 (3) MG/3ML SOLN Take 3 mLs by nebulization every 6 (six) hours as needed. Patient taking differently: Take 3 mLs by nebulization every 6 (six) hours as needed (shortness of breath).  04/29/16  Yes Hosie Poisson, MD  lidocaine (LIDODERM) 5 % Place 1 patch daily onto the skin. Remove & Discard patch within 12 hours or as directed by MD Patient taking differently: Place 1 patch onto the skin daily as needed (pain). Remove & Discard patch within 12 hours or as directed by MD 11/09/16  Yes Nche, Charlene Brooke, NP  methimazole (TAPAZOLE) 5 MG tablet TAKE 1 TABLET BY MOUTH TWICE WEEKLY 12/14/16  Yes Renato Shin, MD  polyethylene glycol (MIRALAX / GLYCOLAX) packet TAKE 17G BY MOUTH TWICE A DAY Patient taking differently: TAKE 17G BY MOUTH TWICE A DAY AS NEED FOR CONSTIPATION 03/12/16  Yes Burns, Claudina Lick, MD  pregabalin (LYRICA) 25 MG capsule Take 1 capsule (25 mg total) by mouth 2 (two) times daily. 01/08/17  Yes Burns, Claudina Lick, MD  Probiotic Product (ALIGN) 4 MG CAPS Take 4 mg by mouth daily.    Yes [provider]  propranolol (INDERAL) 10 MG tablet Take 0.5 tablets (5 mg total) by mouth 2 (two) times daily. 05/26/16  Yes Bonnielee Haff, MD  PROTONIX 40 MG PACK TAKE 20 MLS (40 MG TOTAL) BY MOUTH DAILY. 09/11/16  Yes Burns, Claudina Lick, MD  terconazole (TERAZOL 7) 0.4 % vaginal cream Place 1 applicator vaginally at bedtime. Patient taking differently: Place 1 applicator vaginally at bedtime as needed (irritation).  04/18/15  Yes Lavonia Drafts, MD   traMADol (ULTRAM) 50 MG tablet TAKE 2 TABLETS BY  MOUTH 3 TIMES A DAY AS NEEDED 02/12/17  Yes Burns, Claudina Lick, MD  traZODone (DESYREL) 50 MG tablet TAKE 1 TABLET (50 MG TOTAL) BY MOUTH AT BEDTIME AS NEEDED. FOR SLEEP 09/14/16  Yes Burns, Claudina Lick, MD  warfarin (COUMADIN) 2 MG tablet Take as directed by anticoagulation clinic. Patient taking differently: Take 2 mg by mouth See admin instructions. Take 2 mg every tues,wed, thurs, sat, & sun 01/21/17  Yes Burns, Claudina Lick, MD  warfarin (COUMADIN) 4 MG tablet TAKE AS DIRECTED BY ANTICOAGULATION CLINIC. Patient taking differently: Take 4 mg by mouth every Friday and  Monday 01/21/17  Yes Burns, Claudina Lick, MD  albuterol (PROVENTIL HFA) 108 (90 Base) MCG/ACT inhaler Inhale 2 puffs into the lungs every 6 (six) hours as needed for wheezing or shortness of breath. Patient not taking: Reported on 02/08/2017 05/15/15   Burnard Hawthorne, FNP  triamcinolone ointment (KENALOG) 0.5 % Apply 1 application topically 2 (two) times daily. Patient not taking: Reported on 02/12/2017 06/04/16   [name], MD     Allergies:     Allergies  Allergen Reactions  . Valtrex [Valacyclovir Hcl] Cough    Cough and gagging     Physical Exam:   Vitals  Blood pressure 116/85, pulse 99, temperature 98 F (36.7 C), temperature source Oral, resp. rate 14, SpO2 93 %.   1. General  lying in bed in NAD,    2. Normal affect and insight, Not Suicidal or Homicidal, Awake Alert, Oriented X 3.  3. No F.N deficits, ALL C.Nerves Intact, Strength 5-/5 right upper and lower ext, otherwise 5/5 left side  , Sensation intact all 4 extremities, Plantars down going.  4. Ears and Eyes appear Normal, Conjunctivae clear, PERRLA. Moist Oral Mucosa.  5. Supple Neck, No JVD, No cervical lymphadenopathy appriciated, No Carotid Bruits.  6. Symmetrical Chest wall movement, Good air movement bilaterally, slight crackles bilateral base, no wheezing  7. RRR, No Gallops, Rubs or Murmurs, No  Parasternal Heave.  8. Positive Bowel Sounds, Abdomen Soft, No tenderness, No organomegaly appriciated,No rebound -guarding or rigidity.  9.  No Cyanosis,  Right arm swelling   10. Good muscle tone,  joints appear normal , no effusions, Normal ROM.  11. No Palpable Lymph Nodes in Neck or Axillae     Data Review:    CBC Recent Labs  Lab 02/07/17 2319 02/08/17 0631 02/09/17 0529 02/12/17 1531  WBC 12.1* 11.4* 8.4 9.2  HGB 13.9 12.8 12.2 13.0  HCT 42.1 38.7 37.7 40.1  PLT 415* 392 388 412*  MCV 116.0* 115.5* 117.8* 116.6*  MCH 38.3* 38.2* 38.1* 37.8*  MCHC 33.0 33.1 32.4 32.4  RDW 15.8* 16.0* 15.7* 15.4  LYMPHSABS 0.7 0.7  --  0.9  MONOABS 0.5 0.7  --  0.8  EOSABS 0.0 0.0  --  0.1  BASOSABS 0.0 0.0  --  0.0   ------------------------------------------------------------------------------------------------------------------  Chemistries  Recent Labs  Lab 02/07/17 2319 02/08/17 0631 02/09/17 0440 02/10/17 0434 02/12/17 1531  NA 141 141 142  --  143  K 3.5 3.6 3.5  --  3.4*  CL 109 110 111  --  108  CO2 '27 24 23  ' --  29  GLUCOSE 134* 119* 93  --  102*  BUN '14 13 9  ' --  9  CREATININE 0.41* 0.35* <0.30* <0.30* 0.32*  CALCIUM 8.8* 8.5* 8.2*  --  8.4*  AST 146* 87*  92* 39  --  18  ALT 252* 197*  198* 122*  --  43  ALKPHOS 154* 131*  129* 104  --  87  BILITOT 4.7* 2.9*  2.8* 2.1*  --  0.9   ------------------------------------------------------------------------------------------------------------------ estimated creatinine clearance is 50.2 mL/min (A) (by C-G formula based on SCr of 0.32 mg/dL (L)). ------------------------------------------------------------------------------------------------------------------ No results for input(s): TSH, T4TOTAL, T3FREE, THYROIDAB in the last 72 hours.  Invalid input(s): FREET3  Coagulation profile Recent Labs  Lab 02/07/17 2319 02/08/17 0631 02/09/17 0440 02/10/17 0750 02/12/17 1531  INR 3.36 3.25 2.05 2.13 2.94    ------------------------------------------------------------------------------------------------------------------- No results for input(s): DDIMER in the last 72 hours. -------------------------------------------------------------------------------------------------------------------  Cardiac Enzymes Recent Labs  Lab 02/12/17 1531  TROPONINI 0.10*   ------------------------------------------------------------------------------------------------------------------    Component Value Date/Time   BNP 899.5 (H) 02/12/2017 1531     ---------------------------------------------------------------------------------------------------------------  Urinalysis    Component Value Date/Time   COLORURINE AMBER (A) 02/08/2017 0015   APPEARANCEUR CLEAR 02/08/2017 0015   LABSPEC 1.023 02/08/2017 0015   PHURINE 5.0 02/08/2017 0015   GLUCOSEU NEGATIVE 02/08/2017 0015   GLUCOSEU NEGATIVE 08/22/2014 1700   HGBUR MODERATE (A) 02/08/2017 0015   BILIRUBINUR SMALL (A) 02/08/2017 0015   KETONESUR NEGATIVE 02/08/2017 0015   PROTEINUR 30 (A) 02/08/2017 0015   UROBILINOGEN 0.2 08/22/2014 1700   NITRITE NEGATIVE 02/08/2017 0015   LEUKOCYTESUR MODERATE (A) 02/08/2017 0015    ----------------------------------------------------------------------------------------------------------------   Imaging Results:    Dg Chest Portable 1 View  Result Date: 02/12/2017 CLINICAL DATA:  Shortness of Breath EXAM: PORTABLE CHEST 1 VIEW COMPARISON:  Chest CT May 31, 2015; chest radiograph February 07, 2017 FINDINGS: There is marked deviation of the trachea toward the right with narrowing of the trachea. Prior CT shows marked thyroid enlargement as the cause for this appearance. There is bibasilar airspace consolidation with small left pleural effusion. There are several calcified granulomas in the left upper lobe. Heart is mildly enlarged with pulmonary vascularity normal. No adenopathy. There is aortic atherosclerosis.  Bones are osteoporotic with degenerative change in each shoulder. IMPRESSION: 1. Marked thyroid enlargement with marked deviation of the thoracic trachea toward the right with thoracic tracheal narrowing, stable. 2. Patchy consolidation both lung bases. A degree of bibasilar pneumonia must be of concern. 3.  Small left pleural effusion. 4.  Stable cardiac prominence. 5.  Small granulomas left upper lobe. 6.  Aortic atherosclerosis. 7.  Bones osteoporotic. These results will be called to the ordering clinician or representative by the Radiologist Assistant, and communication documented in the PACS or zVision Dashboard. Electronically Signed   By: Lowella Grip III M.D.   On: 02/12/2017 14:06   nsr at 95, nl axis, no st-t changes c/w ischemia     Assessment & Plan:    Active Problems:   Pneumonia    Hcap Blood culture x2 Sputum gram stain culture Urine legionella antigen Urine strep antigen Influenza, resp viral panel vanco iv pharmacy to dose Cefepime iv pharmacy to dose Speech therapy consultation to r/o aspiration  Troponin elevation Trop I q6h x3 Check cardiac echo Cardiology consult in am (requested by email)  Abnormal liver function, recent past, now normal ? Cholecystitis Per discharge summary surgery=> not a candidate, and IR=> not a candidate for cholecystostomy Check cbc, cmp in am  PE, Hx of chronic R IJ Thrombus Coumadin pharmacy to dose  Essential Thrombocytosis Cont hydroxyurea  Gerd Cont protonix  Hypertension Cont propranolol  Stroke Check lipid  Hyperthyroidism ? Check TSH Cont tapazole      DVT Prophylaxis Coumadin- SCDs  AM Labs Ordered, also please review Full Orders  Family Communication: Admission, patients condition and plan of care including tests being ordered have been discussed with the patient  who indicate understanding and agree with the plan and Code Status.  Code Status FULL CODE  Likely DC to  home  Condition GUARDED    Consults called: speech therapy placed in computer  Admission status: inpatient  Time spent in minutes : 45   Jani Gravel M.D on 02/12/2017 at 7:27 PM  Between 7am to 7pm - Pager - [phone]   . After 7pm go to [email or web address] - password [facility]  [facility] - Office  [phone]

## 2017-02-12 NOTE — ED Notes (Signed)
IV TEAM AT BEDSIDE 

## 2017-02-12 NOTE — Care Management Note (Signed)
Case Management Note  Patient Details  Name: Brantleigh Mifflin MRN: [id number] Date of Birth: 07-13-1932  CM contacted by Lattie Haw with [facility] who advised pt is active with [facility] RN PT NA.  Will follow for needs.  Expected Discharge Date:   Unknown               Expected Discharge Plan:  [place] Choice:  Home Health Choice offered to:  Patient  HH Arranged:  RN, PT, Nurse's Aide [place] Agency:  [facility]  Status of Service:  In process, will continue to follow  Rae Mar, RN 02/12/2017, 1:20 PM

## 2017-02-12 NOTE — ED Provider Notes (Signed)
[facility] DEPT Provider Note   CSN: [id number] Arrival date & time: 02/12/17  1231     History   Chief Complaint Chief Complaint  Patient presents with  . Shortness of Breath    HPI Sandy Barrett is a 84 y.o. female.  HPI   Sandy Brothersis a 84 y.o.femalewithhistory of stroke with right-sided hemiplegia and aphasia, PE, hyperthyroidism, essential thrombocytosis was brought to the ER by her daughter after seemingly increasing short of breath.  Patient is aphasic and does not complain.  Recent hospitalization 2/3-02/10/17 with concern for cholecystitis as well as aspiration pneumonia in the setting of vomiting.  General surgery was consulted, and she was not deemed to be an ideal surgical candidate, IR evaluated patient as well for cholecystostomy, felt not to be a candidate.  HIDA scan showed delayed filling of the gallbladder.  She was placed on antibiotics with Zosyn and then transitioned to augmentin after improving with conservative management.   Daughter reports that the first day after discharge she seemed to be doing fairly well.  Progressively seem like she was having a hard time breathing since yesterday though.  She has felt warm to the touch but they do not take her temperature.  She has had increasing swelling in her right arm and new swelling in her left arm.  He had right upper extremity swelling during hospitalization Dopplers were negative for DVT although she did have chronic right IJ thrombosis.  She is chronically anticoagulated on Coumadin.  She has not had any more vomiting since she left the hospital.  She was hypoxic for EMS with saturations on room air in the 70s.  On my initial evaluation, oxygen saturation was 95% on nonrebreather at approximately 12 L/min.  Past Medical History:  Diagnosis Date  . ARTHRITIS   . Arthritis   . Diverticulosis   . HYPERTENSION   . HYPERTHYROIDISM   . Hyperthyroidism    s/p I-131 ablation  03/2011 of multinod goiter  . INSOMNIA   . MIGRAINE HEADACHE   . OBESITY   . Posttraumatic stress disorder   . Pulmonary embolism ([place]) 03/2014   with DVT  . Stroke [facility]) 03/2014   dysarthria    Patient Active Problem List   Diagnosis Date Noted  . Nausea & vomiting 02/08/2017  . CAP (community acquired pneumonia) 02/08/2017  . SIRS (systemic inflammatory response syndrome) ([place]) 02/08/2017  . Abnormal liver function tests   . Neuralgia 01/09/2017  . Abnormal urine odor 10/28/2016  . Venous congestion 10/08/2016  . Lymphedema of breast 09/11/2016  . Long term (current) use of anticoagulants [Z79.01] 09/10/2016  . Cough 08/23/2016  . Left hip pain 08/09/2016  . Yeast infection of the skin 07/01/2016  . Abnormal stool color 05/18/2016  . Left leg swelling 05/18/2016  . Pressure injury of skin 04/27/2016  . Aspiration pneumonia ([place]) 04/20/2016  . Hemidiaphragm paralysis   . Wheelchair bound 02/03/2016  . Vaginal bleeding 01/28/2016  . Discoloration of skin 09/26/2015  . Essential thrombocytosis ([place]) 07/31/2015  . SVC (superior vena cava obstruction), chronic 07/20/2015  . Dysphagia 07/20/2015  . Elevated liver enzymes 07/20/2015  . Constipation 07/20/2015  . Thrombophlebitis of breast, right 06/18/2015  . Tracheal deviation 06/06/2015  . Sensorineural hearing loss, bilateral, moderate-moderately severe 04/30/2015  . Abdominal wall lump 03/09/2015  . Frequent UTI 02/06/2015  . Urinary tract infection without hematuria 08/21/2014  . Primary osteoarthritis involving multiple joints 07/26/2014  . Pernicious anemia 07/26/2014  . Hearing loss 07/26/2014  .  Encounter for therapeutic drug monitoring 07/17/2014  . Spastic hemiparesis affecting dominant side ([place]), right 05/31/2014  . DVT of lower extremity, bilateral ([place]) 03/14/2014  . Global aphasia 03/14/2014  . 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 Aphasia S/P CVA 03/13/2014  . Cerebral infarction due to embolism of left  middle cerebral artery ([place])   . Stroke, embolic ([place]) 16/10/9602  . History of pulmonary embolism 03/08/2014  . Back pain 08/29/2013  . Primary localized osteoarthrosis, lower leg 03/06/2013  . IBS (irritable bowel syndrome)   . Multinodular goiter 01/13/2011  . Hyperthyroidism 11/11/2009  . Insomnia 07/03/2009  . POSTTRAUMATIC STRESS DISORDER 08/22/2008  . OBESITY 04/21/2008  . Osteoarthritis 04/21/2008  . MIGRAINE HEADACHE 04/20/2008  . Essential hypertension 04/20/2008    Past Surgical History:  Procedure Laterality Date  . ABDOMINAL HYSTERECTOMY  1976  . BREAST SURGERY     biopsy  . RADIOLOGY WITH ANESTHESIA Left 03/08/2014   Procedure: RADIOLOGY WITH ANESTHESIA;  Surgeon: Rob Hickman, MD;  Location: [facility];  Service: Radiology;  Laterality: Left;  . TONSILLECTOMY    . TOTAL HIP ARTHROPLASTY  1998    right    OB History    No data available       Home Medications    Prior to Admission medications   Medication Sig Start Date End Date Taking? Authorizing Provider  acetaminophen (TYLENOL) 160 MG/5ML liquid Take 480 mg by mouth every 4 (four) hours as needed for fever or pain.    [provider]  albuterol (PROVENTIL HFA) 108 (90 Base) MCG/ACT inhaler Inhale 2 puffs into the lungs every 6 (six) hours as needed for wheezing or shortness of breath. Patient not taking: Reported on 02/08/2017 05/15/15   Burnard Hawthorne, FNP  amoxicillin-clavulanate (AUGMENTIN) 875-125 MG tablet Take 1 tablet by mouth 2 (two) times daily. 02/10/17   Caren Griffins, MD  calamine lotion Apply 1 application as needed topically for itching. 11/09/16   Nche, Charlene Brooke, NP  diclofenac sodium (VOLTAREN) 1 % GEL APPLY (2GMS) TOPICALLY THREE TIMES DAILY. 12/21/16   [name], MD  hydroxyurea (HYDREA) 500 MG capsule Take 1 tablet (500 mg) Monday through Friday, take 2 tablets ( 1000 mg ) Saturday and Sunday 10/06/16   Heath Lark, MD  ipratropium-albuterol (DUONEB) 0.5-2.5 (3)  MG/3ML SOLN Take 3 mLs by nebulization every 6 (six) hours as needed. Patient taking differently: Take 3 mLs by nebulization every 6 (six) hours as needed (shortness of breath).  04/29/16   Hosie Poisson, MD  lidocaine (LIDODERM) 5 % Place 1 patch daily onto the skin. Remove & Discard patch within 12 hours or as directed by MD Patient taking differently: Place 1 patch onto the skin daily as needed (pain). Remove & Discard patch within 12 hours or as directed by MD 11/09/16   Nche, Charlene Brooke, NP  methimazole (TAPAZOLE) 5 MG tablet TAKE 1 TABLET BY MOUTH TWICE WEEKLY 12/14/16   Renato Shin, MD  nystatin (MYCOSTATIN/NYSTOP) powder Apply topically 3 (three) times daily. Patient not taking: Reported on 02/08/2017 06/30/16   Heath Lark, MD  polyethylene glycol (MIRALAX / GLYCOLAX) packet TAKE 17G BY MOUTH TWICE A DAY Patient taking differently: TAKE 17G BY MOUTH TWICE A DAY AS NEED FOR CONSTIPATION 03/12/16   Burns, Claudina Lick, MD  pregabalin (LYRICA) 25 MG capsule Take 1 capsule (25 mg total) by mouth 2 (two) times daily. 01/08/17   [name], MD  Probiotic Product (ALIGN) 4 MG CAPS Take  4 mg by mouth daily.     [provider]  propranolol (INDERAL) 10 MG tablet Take 0.5 tablets (5 mg total) by mouth 2 (two) times daily. 05/26/16   Bonnielee Haff, MD  PROTONIX 40 MG PACK TAKE 20 MLS (40 MG TOTAL) BY MOUTH DAILY. 09/11/16   [name], MD  terconazole (TERAZOL 7) 0.4 % vaginal cream Place 1 applicator vaginally at bedtime. Patient taking differently: Place 1 applicator vaginally at bedtime as needed (irritation).  04/18/15   Lavonia Drafts, MD  traMADol (ULTRAM) 50 MG tablet TAKE 2 TABLETS BY MOUTH 3 TIMES A DAY AS NEEDED 02/12/17   [name], MD  traZODone (DESYREL) 50 MG tablet TAKE 1 TABLET (50 MG TOTAL) BY MOUTH AT BEDTIME AS NEEDED. FOR SLEEP 09/14/16   [name], MD  triamcinolone ointment (KENALOG) 0.5 % Apply 1 application topically 2 (two) times daily. Patient taking  differently: Apply 1 application topically 2 (two) times daily as needed (irritation).  06/04/16   [name], MD  warfarin (COUMADIN) 2 MG tablet Take as directed by anticoagulation clinic. Patient taking differently: Take 2 mg by mouth See admin instructions. Take 2 mg every tues,wed, thurs, sat, & sun 01/21/17   [name], MD  warfarin (COUMADIN) 4 MG tablet TAKE AS DIRECTED BY ANTICOAGULATION CLINIC. Patient taking differently: Take 4 mg by mouth every Friday and  Monday 01/21/17   [name], MD    Family History Family History  Problem Relation Age of Onset  . Asthma Mother   . Asthma Father   . Prostate cancer Father   . Stroke Brother   . Breast cancer Sister   . Stomach cancer Sister   . Thyroid disease Neg Hx     Social History Social History   Tobacco Use  . Smoking status: Never Smoker  . Smokeless tobacco: Never Used  Substance Use Topics  . Alcohol use: No    Alcohol/week: 0.0 oz  . Drug use: No     Allergies   Valtrex [valacyclovir hcl]   Review of Systems Review of Systems   Level 5 caveat because of the aphasia.  Physical Exam Updated Vital Signs BP 99/79 (BP Location: Left Arm)   Pulse 95   Temp 98 F (36.7 C) (Oral)   Resp 19   SpO2 91%   Physical Exam  Constitutional: She appears well-developed and well-nourished. No distress.  Laying in bed.  Chronically ill-appearing.  HENT:  Head: Normocephalic and atraumatic.  Eyes: Conjunctivae are normal. Right eye exhibits no discharge. Left eye exhibits no discharge.  Neck: Neck supple.  Cardiovascular: Normal rate, regular rhythm and normal heart sounds. Exam reveals no gallop and no friction rub.  No murmur heard. Pulmonary/Chest: Effort normal and breath sounds normal. No respiratory distress.  Abdominal: Soft. She exhibits no distension. There is no tenderness.  Musculoskeletal: She exhibits no edema or tenderness.  Severe, nonpitting edema of the right upper extremity.  Left  upper extremity seems grossly normal in appearance.  Neurological: She is alert.  Responds to questioning but has severe expressive aphasia.  Right hemiparesis.  Skin: Skin is warm and dry.  Psychiatric: She has a normal mood and affect. Her behavior is normal. Thought content normal.  Nursing note and vitals reviewed.    ED Treatments / Results  Labs (all labs ordered are listed, but only abnormal results are displayed) Labs Reviewed  CBC WITH DIFFERENTIAL/PLATELET - Abnormal; Notable for the following components:  Result Value   RBC 3.44 (*)    MCV 116.6 (*)    MCH 37.8 (*)    Platelets 412 (*)    All other components within normal limits  BASIC METABOLIC PANEL - Abnormal; Notable for the following components:   Potassium 3.4 (*)    Glucose, Bld 102 (*)    Creatinine, Ser 0.32 (*)    Calcium 8.4 (*)    All other components within normal limits  BRAIN NATRIURETIC PEPTIDE - Abnormal; Notable for the following components:   B Natriuretic Peptide 899.5 (*)    All other components within normal limits  TROPONIN I - Abnormal; Notable for the following components:   Troponin I 0.10 (*)    All other components within normal limits  PROTIME-INR - Abnormal; Notable for the following components:   Prothrombin Time 30.4 (*)    All other components within normal limits  HEPATIC FUNCTION PANEL - Abnormal; Notable for the following components:   Total Protein 5.7 (*)    Albumin 2.7 (*)    All other components within normal limits  PROTIME-INR - Abnormal; Notable for the following components:   Prothrombin Time 33.0 (*)    All other components within normal limits  CREATININE, SERUM - Abnormal; Notable for the following components:   Creatinine, Ser 0.32 (*)    All other components within normal limits  CULTURE, BLOOD (ROUTINE X 2)  CULTURE, BLOOD (ROUTINE X 2)  CULTURE, EXPECTORATED SPUTUM-ASSESSMENT  GRAM STAIN  INFLUENZA PANEL BY PCR (TYPE A & B)  HIV ANTIBODY (ROUTINE  TESTING)  STREP PNEUMONIAE URINARY ANTIGEN  LEGIONELLA PNEUMOPHILA SEROGP 1 UR AG  I-STAT CG4 LACTIC ACID, ED  I-STAT CG4 LACTIC ACID, ED    EKG  EKG Interpretation  Date/Time:  Friday February 12 2017 13:17:40 EST Ventricular Rate:  95 PR Interval:    QRS Duration: 63 QT Interval:  397 QTC Calculation: 500 R Axis:   46 Text Interpretation:  Sinus rhythm Anteroseptal infarct, age indeterminate Confirmed by Virgel Manifold [phone]) on 02/12/2017 2:20:44 PM       Radiology Dg Chest Portable 1 View  Result Date: 02/12/2017 CLINICAL DATA:  Shortness of Breath EXAM: PORTABLE CHEST 1 VIEW COMPARISON:  Chest CT May 31, 2015; chest radiograph February 07, 2017 FINDINGS: There is marked deviation of the trachea toward the right with narrowing of the trachea. Prior CT shows marked thyroid enlargement as the cause for this appearance. There is bibasilar airspace consolidation with small left pleural effusion. There are several calcified granulomas in the left upper lobe. Heart is mildly enlarged with pulmonary vascularity normal. No adenopathy. There is aortic atherosclerosis. Bones are osteoporotic with degenerative change in each shoulder. IMPRESSION: 1. Marked thyroid enlargement with marked deviation of the thoracic trachea toward the right with thoracic tracheal narrowing, stable. 2. Patchy consolidation both lung bases. A degree of bibasilar pneumonia must be of concern. 3.  Small left pleural effusion. 4.  Stable cardiac prominence. 5.  Small granulomas left upper lobe. 6.  Aortic atherosclerosis. 7.  Bones osteoporotic. These results will be called to the ordering clinician or representative by the Radiologist Assistant, and communication documented in the PACS or zVision Dashboard. Electronically Signed   By: Lowella Grip III M.D.   On: 02/12/2017 14:06    Procedures Procedures (including critical care time)  Angiocath insertion Performed by: Virgel Manifold  Consent: Verbal consent  obtained. Risks and benefits: risks, benefits and alternatives were discussed Time out: Immediately prior to procedure a "  time out" was called to verify the correct patient, procedure, equipment, support staff and site/side marked as required.  Preparation: Patient was prepped and draped in the usual sterile fashion.  Vein Location: L AC  Ultrasound Guided  Gauge: 20g  Normal blood return and flush without difficulty Patient tolerance: Patient tolerated the procedure well with no immediate complications.    Medications Ordered in ED Medications - No data to display   Initial Impression / Assessment and Plan / ED Course  I have reviewed the triage vital signs and the nursing notes.  Pertinent labs & imaging results that were available during my care of the patient were reviewed by me and considered in my medical decision making (see chart for details).     84 year old female with progressive shortness of breath.  Oxygen saturations 75% on room air.  Improved on nonrebreather then transitioned to oxygen to keep her oxygen saturations in the 90s.  Chest x-ray with possible pneumonia.  Will cover with antibiotics for possible nosocomial infection.  Will check flu.  Admission.  Final Clinical Impressions(s) / ED Diagnoses   Final diagnoses:  HCAP (healthcare-associated pneumonia)  Elevated troponin    ED Discharge Orders    None       Virgel Manifold, MD 02/13/17 1544

## 2017-02-12 NOTE — ED Notes (Signed)
Kohut MD at bedside.

## 2017-02-12 NOTE — Progress Notes (Addendum)
ANTICOAGULATION CONSULT NOTE - Initial Consult  Pharmacy Consult for warfarin Indication: pulmonary embolus/DVT - previous history  Allergies  Allergen Reactions  . Valtrex [Valacyclovir Hcl] Cough    Cough and gagging    Patient Measurements:   Heparin Dosing Weight:   Vital Signs: Temp: 98 F (36.7 C) (02/08 1311) Temp Source: Oral (02/08 1311) BP: 127/87 (02/08 1930) Pulse Rate: 102 (02/08 1930)  Labs: Recent Labs    02/10/17 0434 02/10/17 0750 02/12/17 1531  HGB  --   --  13.0  HCT  --   --  40.1  PLT  --   --  412*  LABPROT  --  23.6* 30.4*  INR  --  2.13 2.94  CREATININE <0.30*  --  0.32*  TROPONINI  --   --  0.10*    Estimated Creatinine Clearance: 50.2 mL/min (A) (by C-G formula based on SCr of 0.32 mg/dL (L)).   Medical History: Past Medical History:  Diagnosis Date  . ARTHRITIS   . Arthritis   . Diverticulosis   . HYPERTENSION   . HYPERTHYROIDISM   . Hyperthyroidism    s/p I-131 ablation 03/2011 of multinod goiter  . INSOMNIA   . MIGRAINE HEADACHE   . OBESITY   . Posttraumatic stress disorder   . Pulmonary embolism ([place]) 03/2014   with DVT  . Stroke [facility]) 03/2014   dysarthria   Assessment: 39 YOM with history of VTE on warfarin therapy at home.  His INR at admission is at upper end of goal  Home dose: 2mg  daily except 4mg  on Mon/Fri (last dose 2/7 10PM)  Today, 02/12/2017  INR  = 2.94  CBC: Hgb WNL, Pltc elevated  Goal of Therapy:  INR 2-3   Plan:   Due to INR being at upper end goal, reduce her normal Friday dose to 2mg  PO x 1 tonight  Daily INR  Doreene Eland, PharmD, BCPS.   Pager: [phone] 02/12/2017 8:11 PM

## 2017-02-12 NOTE — ED Notes (Signed)
Dr. Jani Gravel, hospitalist, was made aware that blood cultures were obtained after first doses of IV antibiotics.

## 2017-02-12 NOTE — Telephone Encounter (Signed)
Transition Care Management Follow-up Telephone Call   Date discharged? 02/10/17   How have you been since you were released from the hospital? Spoke w/pt daughter Sandy Barrett) she states she had to call and talk to one of the nurse about Sandy Barrett. She was going to send msg to Dr. Quay Burow and give her a call back. These is a lot of stuff going on, and not sure if she need to be seen today, or go to back to ED. Inform Sandy Barrett I'm actually from Dr. Quay Burow office, and per pt chart no msg has been sent yet. I will go ahead and make Hosp f/u for Mon w/dr. Quay Burow, but will call her back w/MD response on msg once she receives.    Do you understand why you were in the hospital? YES   Do you understand the discharge instructions? YES   Where were you discharged to? Home   Items Reviewed:  Medications reviewed: YES  Allergies reviewed: YES  Dietary changes reviewed: NO  Referrals reviewed: No referral needed   Functional Questionnaire:   Activities of Daily Living (ADLs):   She states she are independent in the following: feeding, continence, grooming and toileting States she require assistance with the following: ambulation, bathing and hygiene and dressing   Any transportation issues/concerns?: NO   Any patient concerns? YES, msg has been sent to provider   Confirmed importance and date/time of follow-up visits scheduled YES, appt 02/15/17  Provider Appointment booked with Dr. Quay Burow  Confirmed with patient if condition begins to worsen call PCP or go to the ER.  Patient was given the office number and encouraged to call back with question or concerns.  : YES

## 2017-02-13 ENCOUNTER — Other Ambulatory Visit: Payer: Self-pay

## 2017-02-13 ENCOUNTER — Inpatient Hospital Stay (HOSPITAL_COMMUNITY): Payer: Medicare Other

## 2017-02-13 DIAGNOSIS — I361 Nonrheumatic tricuspid (valve) insufficiency: Secondary | ICD-10-CM

## 2017-02-13 DIAGNOSIS — A419 Sepsis, unspecified organism: Secondary | ICD-10-CM | POA: Diagnosis present

## 2017-02-13 DIAGNOSIS — I6389 Other cerebral infarction: Secondary | ICD-10-CM

## 2017-02-13 DIAGNOSIS — R748 Abnormal levels of other serum enzymes: Secondary | ICD-10-CM

## 2017-02-13 DIAGNOSIS — R9431 Abnormal electrocardiogram [ECG] [EKG]: Secondary | ICD-10-CM

## 2017-02-13 DIAGNOSIS — R0600 Dyspnea, unspecified: Secondary | ICD-10-CM

## 2017-02-13 LAB — CULTURE, BLOOD (ROUTINE X 2)
Culture: NO GROWTH
Culture: NO GROWTH
Special Requests: ADEQUATE
Special Requests: ADEQUATE

## 2017-02-13 LAB — CREATININE, SERUM
Creatinine, Ser: 0.32 mg/dL — ABNORMAL LOW (ref 0.44–1.00)
GFR calc Af Amer: >60 mL/min (ref >60)
GFR calc non Af Amer: >60 mL/min (ref >60)

## 2017-02-13 LAB — PROTIME-INR
INR: 3.26
Prothrombin Time: 33 seconds — ABNORMAL HIGH (ref 11.4–15.2)

## 2017-02-13 LAB — HIV ANTIBODY (ROUTINE TESTING W REFLEX): HIV Screen 4th Generation wRfx: NONREACTIVE

## 2017-02-13 LAB — ECHOCARDIOGRAM COMPLETE

## 2017-02-13 MED ORDER — FUROSEMIDE 10 MG/ML IJ SOLN
40.0000 mg | Freq: Every day | INTRAMUSCULAR | Status: DC
  Administered 2017-02-13 – 2017-02-16 (×4): 40 mg via INTRAVENOUS
  Filled 2017-02-13 (×4): qty 4

## 2017-02-13 MED ORDER — SODIUM CHLORIDE 0.9 % IV SOLN
INTRAVENOUS | Status: DC
  Administered 2017-02-13 – 2017-02-14 (×2): via INTRAVENOUS

## 2017-02-13 MED ORDER — POTASSIUM CHLORIDE 10 MEQ/100ML IV SOLN
10.0000 meq | INTRAVENOUS | Status: AC
  Administered 2017-02-13 (×2): 10 meq via INTRAVENOUS
  Filled 2017-02-13 (×2): qty 100

## 2017-02-13 NOTE — ED Notes (Signed)
ED TO INPATIENT HANDOFF REPORT  Name/Age/Gender Sandy Barrett 84 y.o. female  Code Status    Code Status Orders  (From admission, onward)        Start     Ordered   02/12/17 2017  Full code  Continuous     02/12/17 2016    Code Status History    Date Active Date Inactive Code Status Order ID Comments User Context   02/08/2017 04:53 02/10/2017 17:33 Partial Code [id number]  Rise Patience, MD Inpatient   05/19/2016 01:38 05/26/2016 17:29 Partial Code [id number]  [name], MD Inpatient   04/26/2016 17:49 04/29/2016 21:20 DNR [id number]  [name], NP Inpatient   04/20/2016 14:08 04/26/2016 17:49 Full Code [id number]  [name], PA-C ED   03/13/2014 16:16 04/05/2014 16:27 Full Code [id number]  [name], PA-C Inpatient   03/13/2014 16:16 03/13/2014 16:16 Full Code [id number]  [name], PA-C Inpatient   03/08/2014 20:44 03/13/2014 16:16 Full Code [id number]  [name] Inpatient   03/08/2014 20:37 03/08/2014 20:44 Full Code [id number]  [name], NP Inpatient      Home/SNF/Other Home  Chief Complaint Shortness of Breath  Level of Care/Admitting Diagnosis ED Disposition    ED Disposition Condition [facility]: [facility] [[id number]]  Level of Care: Telemetry [5]  Admit to tele based on following criteria: Other see comments  Comments: sepsis  Diagnosis: Sepsis [facility]) [[id number]]  Admitting Physician: Elwyn Reach [[id number]]  Attending Physician: Elwyn Reach [[id number]]  Estimated length of stay: past midnight tomorrow  Certification:: I certify this patient will need inpatient services for at least 2 midnights  PT Class (Do Not Modify): Inpatient [101]  PT Acc Code (Do Not Modify): Private [1]       Medical History Past Medical History:  Diagnosis Date  . ARTHRITIS   . Arthritis   . Diverticulosis   . Essential thrombocytosis ([place])   . HYPERTENSION   . HYPERTHYROIDISM   . Hyperthyroidism     s/p I-131 ablation 03/2011 of multinod goiter  . INSOMNIA   . MIGRAINE HEADACHE   . OBESITY   . Posttraumatic stress disorder   . Pulmonary embolism ([place]) 03/2014   with DVT  . Stroke [facility]) 03/2014   dysarthria    Allergies Allergies  Allergen Reactions  . Valtrex [Valacyclovir Hcl] Cough    Cough and gagging    IV Location/Drains/Wounds Patient Lines/Drains/Airways Status   Active Line/Drains/Airways    Name:   Placement date:   Placement time:   Site:   Days:   Peripheral IV 02/12/17 Left;Anterior Forearm   02/12/17    1605    Forearm   1   Pressure Injury 05/19/16 Stage I -  Intact skin with non-blanchable redness of a localized area usually over a bony prominence. Prior healing pressure ulcer/red, dry; patchy areas of partial thickness skin loss related to moisture associated skin damage   05/19/16    0243     270          Labs/Imaging Results for orders placed or performed during the hospital encounter of 02/12/17 (from the past 48 hour(s))  I-Stat CG4 Lactic Acid, ED     Status: None   Collection Time: 02/12/17  3:28 PM  Result Value Ref Range   Lactic Acid, Venous 1.15 0.5 - 1.9 mmol/L  CBC with Differential     Status: Abnormal   Collection Time: 02/12/17  3:31 PM  Result Value Ref Range   WBC 9.2 4.0 - 10.5 K/uL   RBC 3.44 (L) 3.87 - 5.11 MIL/uL   Hemoglobin 13.0 12.0 - 15.0 g/dL   HCT 40.1 36.0 - 46.0 %   MCV 116.6 (H) 78.0 - 100.0 fL   MCH 37.8 (H) 26.0 - 34.0 pg   MCHC 32.4 30.0 - 36.0 g/dL   RDW 15.4 11.5 - 15.5 %   Platelets 412 (H) 150 - 400 K/uL   Neutrophils Relative % 80 %   Lymphocytes Relative 10 %   Monocytes Relative 9 %   Eosinophils Relative 1 %   Basophils Relative 0 %   Neutro Abs 7.4 1.7 - 7.7 K/uL   Lymphs Abs 0.9 0.7 - 4.0 K/uL   Monocytes Absolute 0.8 0.1 - 1.0 K/uL   Eosinophils Absolute 0.1 0.0 - 0.7 K/uL   Basophils Absolute 0.0 0.0 - 0.1 K/uL   Smear Review MORPHOLOGY UNREMARKABLE     Comment: Performed at [facility], [place] [address] [place], [place] [address]  Basic metabolic panel     Status: Abnormal   Collection Time: 02/12/17  3:31 PM  Result Value Ref Range   Sodium 143 135 - 145 mmol/L   Potassium 3.4 (L) 3.5 - 5.1 mmol/L   Chloride 108 101 - 111 mmol/L   CO2 29 22 - 32 mmol/L   Glucose, Bld 102 (H) 65 - 99 mg/dL   BUN 9 6 - 20 mg/dL   Creatinine, Ser 0.32 (L) 0.44 - 1.00 mg/dL   Calcium 8.4 (L) 8.9 - 10.3 mg/dL   GFR calc non Af Amer >60 >60 mL/min   GFR calc Af Amer >60 >60 mL/min    Comment: (NOTE) The eGFR has been calculated using the CKD EPI equation. This calculation has not been validated in all clinical situations. eGFR's persistently <60 mL/min signify possible Chronic Kidney Disease.    Anion gap 6 5 - 15    Comment: Performed at [facility], [place] [address], [place], [place] [address]  Brain natriuretic peptide     Status: Abnormal   Collection Time: 02/12/17  3:31 PM  Result Value Ref Range   B Natriuretic Peptide 899.5 (H) 0.0 - 100.0 pg/mL    Comment: Performed at [facility], [place] [address], [place], [place] [address]  Troponin I     Status: Abnormal   Collection Time: 02/12/17  3:31 PM  Result Value Ref Range   Troponin I 0.10 (HH) <0.03 ng/mL    Comment: CRITICAL RESULT CALLED TO, READ BACK BY AND VERIFIED WITH: WEST,S @ 1623 ON [id number] BY POTEAT,S Performed at [place] [address], [place],  [address]   Protime-INR     Status: Abnormal   Collection Time: 02/12/17  3:31 PM  Result Value Ref Range   Prothrombin Time 30.4 (H) 11.4 - 15.2 seconds   INR 2.94     Comment: Performed at [facility], [place] [address] [place],  [address]  Hepatic function panel     Status: Abnormal   Collection Time: 02/12/17  3:31 PM  Result Value Ref Range   Total Protein 5.7 (L) 6.5 - 8.1 g/dL   Albumin 2.7 (L) 3.5 - 5.0 g/dL   AST 18 15 - 41 U/L   ALT 43 14 - 54 U/L    Alkaline Phosphatase 87 38 - 126 U/L   Total Bilirubin 0.9 0.3 -  1.2 mg/dL   Bilirubin, Direct 0.3 0.1 - 0.5 mg/dL   Indirect Bilirubin 0.6 0.3 - 0.9 mg/dL    Comment: Performed at [facility], [place] [address], [place], [place] [address]  Influenza panel by PCR (type A & B)     Status: None   Collection Time: 02/12/17  4:50 PM  Result Value Ref Range   Influenza A By PCR NEGATIVE NEGATIVE   Influenza B By PCR NEGATIVE NEGATIVE    Comment: (NOTE) The Xpert Xpress Flu assay is intended as an aid in the diagnosis of  influenza and should not be used as a sole basis for treatment.  This  assay is FDA approved for nasopharyngeal swab specimens only. Nasal  washings and aspirates are unacceptable for Xpert Xpress Flu testing. Performed at [facility], [place] [address] [place], [place] [address]   HIV antibody     Status: None   Collection Time: 02/12/17  8:27 PM  Result Value Ref Range   HIV Screen 4th Generation wRfx Non Reactive Non Reactive    Comment: (NOTE) Performed At: [facility] [place], [place] [id number] Rush Farmer MD WG:[id number] Performed at [facility], [place] [address] [place], [place] [address]   Protime-INR     Status: Abnormal   Collection Time: 02/13/17  5:54 AM  Result Value Ref Range   Prothrombin Time 33.0 (H) 11.4 - 15.2 seconds   INR 3.26     Comment: Performed at [facility], [place] [address], [place], [place] [address]  Creatinine, serum     Status: Abnormal   Collection Time: 02/13/17  5:54 AM  Result Value Ref Range   Creatinine, Ser 0.32 (L) 0.44 - 1.00 mg/dL   GFR calc non Af Amer >60 >60 mL/min   GFR calc Af Amer >60 >60 mL/min    Comment: (NOTE) The eGFR has been calculated using the CKD EPI equation. This calculation has not been validated in all clinical situations. eGFR's persistently <60 mL/min signify possible Chronic  Kidney Disease. Performed at [facility], [place] [address], [place], [place] [address]    *Note: Due to a large number of results and/or encounters for the requested time period, some results have not been displayed. A complete set of results can be found in Results Review.   Dg Chest Portable 1 View  Result Date: 02/12/2017 CLINICAL DATA:  Shortness of Breath EXAM: PORTABLE CHEST 1 VIEW COMPARISON:  Chest CT May 31, 2015; chest radiograph February 07, 2017 FINDINGS: There is marked deviation of the trachea toward the right with narrowing of the trachea. Prior CT shows marked thyroid enlargement as the cause for this appearance. There is bibasilar airspace consolidation with small left pleural effusion. There are several calcified granulomas in the left upper lobe. Heart is mildly enlarged with pulmonary vascularity normal. No adenopathy. There is aortic atherosclerosis. Bones are osteoporotic with degenerative change in each shoulder. IMPRESSION: 1. Marked thyroid enlargement with marked deviation of the thoracic trachea toward the right with thoracic tracheal narrowing, stable. 2. Patchy consolidation both lung bases. A degree of bibasilar pneumonia must be of concern. 3.  Small left pleural effusion. 4.  Stable cardiac prominence. 5.  Small granulomas left upper lobe. 6.  Aortic atherosclerosis. 7.  Bones osteoporotic. These results will be called to the ordering clinician or representative by the Radiologist Assistant, and communication documented in the PACS or zVision Dashboard. Electronically Signed   By:  Lowella Grip III M.D.   On: 02/12/2017 14:06    Pending Labs Unresulted Labs (From admission, onward)   Start     Ordered   02/13/17 0500  Protime-INR  Daily,   R     02/12/17 2012   02/13/17 0500  Creatinine, serum  Every 48 hours,   R     02/12/17 2035   02/12/17 2017  Culture, blood (routine x 2) Call MD if unable to obtain prior to antibiotics being given  BLOOD  CULTURE X 2,   R    Comments:  If blood cultures drawn in Emergency Department - Do not draw and cancel order   Question:  Patient immune status  Answer:  Normal   02/12/17 2016   02/12/17 2017  Culture, sputum-assessment  Once,   R    Question:  Patient immune status  Answer:  Normal   02/12/17 2016   02/12/17 2017  Gram stain  Once,   R    Question:  Patient immune status  Answer:  Normal   02/12/17 2016   02/12/17 2017  Strep pneumoniae urinary antigen  Once,   R     02/12/17 2016   02/12/17 2017  Legionella Pneumophila Serogp 1 Ur Ag  Once,   R     02/12/17 2016      Vitals/Pain Today's Vitals   02/13/17 0729 02/13/17 0911 02/13/17 1004 02/13/17 1326  BP:   125/84 (!) 105/54  Pulse:   94 90  Resp:   18 (!) 24  Temp:      TempSrc:      SpO2:   100% 100%  PainSc: 0-No pain 7       Isolation Precautions No active isolations  Medications Medications  ipratropium-albuterol (DUONEB) 0.5-2.5 (3) MG/3ML nebulizer solution 3 mL (not administered)  pregabalin (LYRICA) capsule 25 mg (25 mg Oral Given 02/13/17 0934)  acidophilus (RISAQUAD) capsule 1 capsule (1 capsule Oral Given 02/13/17 0934)  polyethylene glycol (MIRALAX / GLYCOLAX) packet 17 g (17 g Oral Given 02/13/17 0931)  propranolol (INDERAL) tablet 5 mg (5 mg Oral Given 02/13/17 0932)  methimazole (TAPAZOLE) tablet 5 mg (not administered)  pantoprazole sodium (PROTONIX) 40 mg/20 mL oral suspension 40 mg (40 mg Oral Given 02/13/17 1313)  traMADol (ULTRAM) tablet 50 mg (50 mg Oral Given 02/13/17 0911)  traZODone (DESYREL) tablet 50 mg (50 mg Oral Given 02/12/17 2246)  ceFEPIme (MAXIPIME) 1 g in dextrose 5 % 50 mL IVPB (1 g Intravenous New Bag/Given 02/13/17 1522)  0.9 %  sodium chloride infusion ( Intravenous Stopped 02/13/17 0910)  Warfarin - Pharmacist Dosing Inpatient (0 each Does not apply Hold 02/13/17 1800)  vancomycin (VANCOCIN) IVPB 750 mg/150 ml premix (not administered)  hydroxyurea (HYDREA) capsule 500 mg (500 mg Oral Given  02/12/17 2241)    And  hydroxyurea (HYDREA) capsule 1,000 mg (1,000 mg Oral Given 02/13/17 0936)  furosemide (LASIX) injection 40 mg (40 mg Intravenous Given 02/13/17 0931)  vancomycin (VANCOCIN) 1,500 mg in sodium chloride 0.9 % 500 mL IVPB (0 mg Intravenous Stopped 02/12/17 1838)  piperacillin-tazobactam (ZOSYN) IVPB 3.375 g (0 g Intravenous Stopped 02/12/17 1951)  warfarin (COUMADIN) tablet 2 mg (2 mg Oral Given 02/12/17 2246)  potassium chloride 10 mEq in 100 mL IVPB (0 mEq Intravenous Stopped 02/13/17 1313)    Mobility manual wheelchair

## 2017-02-13 NOTE — Progress Notes (Signed)
Patient ID: Sandy Barrett, female   DOB: 1932-09-10, 84 y.o.   MRN: [id number]  PROGRESS NOTE    Sandy Barrett  Sandy Barrett DOB: [birth year] DOA: 02/12/2017 PCP: Sandy Rail, MD   Outpatient Specialists:Ni Alvy Bimler    Brief Narrative: Sandy Barrett  is a 84 y.o. 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 Pt states fever started last nite and has dry cough  then had more difficulty with congestion, and dyspnea today and therefore presented to ED.  patient was evaluated in the ER and found to have an enlarged thyroid gland with deviation of the trachea but also patchy consolidation in both lung bases as well as elevated   Cardiology has been consulted and echo she is being admitted for treatment of early sepsis due to pneumonia and elevated troponin    Assessment & Plan:   Active Problems:   Pneumonia   HCAP (healthcare-associated pneumonia)   Sepsis ([place])  #1. healthcare associated pneumonia Patient currently on IV cefepime and vancomycin.  Continue current treatment and await for culture and sensitivity results  #2 elevated troponin: Probably as a result of pneumonia.  Appreciate cardiology input.  Echocardiogram reviewed with no significant abnormalities  #3 history of PE: Continue warfarin per pharmacy  #4 essential thrombocytosis: Patient on hydroxyurea.  Continue according to hematology  #5 GERD: Continue PPIs  #6 hypertension: Continue blood pressure control with  Propranolol  #7 history of CVA: Continue physical therapy and occupational there  #8 hyperparathyroidism: Patient on Tapazole.  We'll continue treatment    DVT prophylaxis:warfarin   Code Status: Full  Family Communication: daughter who is with patient in the room  Disposition Plan: to be determined  Consultants:   Dr. Johnsie Cancel cardiologist   Procedures:  None    Antimicrobials: vancomycin and cefepime  Subjective: Patient is doing better.  She is breathing better  With no fever this morning.  She is dysarthric and feels weak still.  Objective: Vitals:   02/13/17 0118 02/13/17 1004 02/13/17 1326 02/13/17 1600  BP: (!) 125/95 125/84 (!) 105/54 (!) 155/81  Pulse: 94 94 90 93  Resp: 19 18 (!) 24 20  Temp:    97.9 F (36.6 C)  TempSrc:    Oral  SpO2: 100% 100% 100% 100%  Weight:    76.8 kg (169 lb 5 oz)  Height:    5' (1.524 m)    Intake/Output Summary (Last 24 hours) at 02/13/2017 1803 Last data filed at 02/13/2017 1313 Gross per 24 hour  Intake 3158.5 ml  Output 1100 ml  Net 2058.5 ml   Filed Weights   02/13/17 1600  Weight: 76.8 kg (169 lb 5 oz)    Examination:  General exam: Appears calm and comfortable  Respiratory system: Clear to auscultation. Respiratory effort normal. Cardiovascular system: S1 & S2 heard, RRR. No JVD, murmurs, rubs, gallops or clicks. No pedal edema. Gastrointestinal system: Abdomen is nondistended, soft and nontender. No organomegaly or masses felt. Normal bowel sounds heard. Central nervous system: Alert and oriented. No focal neurological deficits. Extremities: Symmetric 5 x 5 power. Skin: No rashes, lesions or ulcers Psychiatry: Judgement and insight appear normal. Mood & affect appropriate.     Data Reviewed: I have personally reviewed following labs and imaging studies  CBC: Recent Labs  Lab 02/07/17 2319 02/08/17 0631 02/09/17 0529 02/12/17 1531  WBC 12.1* 11.4* 8.4 9.2  NEUTROABS 10.9* 10.0*  --  7.4  HGB 13.9 12.8 12.2 13.0  HCT 42.1 38.7 37.7 40.1  MCV 116.0* 115.5* 117.8* 116.6*  PLT 415* 392 388 854*   Basic Metabolic Panel: Recent Labs  Lab 02/07/17 2319 02/08/17 0631 02/09/17 0440 02/10/17 0434 02/12/17 1531 02/13/17 0554  NA 141 141 142  --  143  --   K 3.5 3.6 3.5  --  3.4*  --   CL 109 110 111  --  108  --   CO2 27 24 23   --  29  --   GLUCOSE 134* 119* 93   --  102*  --   BUN 14 13 9   --  9  --   CREATININE 0.41* 0.35* <0.30* <0.30* 0.32* 0.32*  CALCIUM 8.8* 8.5* 8.2*  --  8.4*  --    GFR: Estimated Creatinine Clearance: 49.6 mL/min (A) (by C-G formula based on SCr of 0.32 mg/dL (L)). Liver Function Tests: Recent Labs  Lab 02/07/17 2319 02/08/17 0631 02/09/17 0440 02/12/17 1531  AST 146* 87*  92* 39 18  ALT 252* 197*  198* 122* 43  ALKPHOS 154* 131*  129* 104 87  BILITOT 4.7* 2.9*  2.8* 2.1* 0.9  PROT 6.3* 5.7*  5.8* 5.0* 5.7*  ALBUMIN 3.1* 2.8*  2.9* 2.6* 2.7*   No results for input(s): LIPASE, AMYLASE in the last 168 hours. No results for input(s): AMMONIA in the last 168 hours. Coagulation Profile: Recent Labs  Lab 02/08/17 0631 02/09/17 0440 02/10/17 0750 02/12/17 1531 02/13/17 0554  INR 3.25 2.05 2.13 2.94 3.26   Cardiac Enzymes: Recent Labs  Lab 02/12/17 1531  TROPONINI 0.10*   BNP (last 3 results) No results for input(s): PROBNP in the last 8760 hours. HbA1C: No results for input(s): HGBA1C in the last 72 hours. CBG: Recent Labs  Lab 02/08/17 2228 02/09/17 0757 02/09/17 1557 02/09/17 2312 02/10/17 0805  GLUCAP 99 82 76 107* 89   Lipid Profile: No results for input(s): CHOL, HDL, LDLCALC, TRIG, CHOLHDL, LDLDIRECT in the last 72 hours. Thyroid Function Tests: No results for input(s): TSH, T4TOTAL, FREET4, T3FREE, THYROIDAB in the last 72 hours. Anemia Panel: No results for input(s): VITAMINB12, FOLATE, FERRITIN, TIBC, IRON, RETICCTPCT in the last 72 hours. Urine analysis:    Component Value Date/Time   COLORURINE AMBER (A) 02/08/2017 0015   APPEARANCEUR CLEAR 02/08/2017 0015   LABSPEC 1.023 02/08/2017 0015   PHURINE 5.0 02/08/2017 0015   GLUCOSEU NEGATIVE 02/08/2017 0015   GLUCOSEU NEGATIVE 08/22/2014 1700   HGBUR MODERATE (A) 02/08/2017 0015   BILIRUBINUR SMALL (A) 02/08/2017 0015   KETONESUR NEGATIVE 02/08/2017 0015   PROTEINUR 30 (A) 02/08/2017 0015   UROBILINOGEN 0.2 08/22/2014 1700    NITRITE NEGATIVE 02/08/2017 0015   LEUKOCYTESUR MODERATE (A) 02/08/2017 0015   Sepsis Labs: @LABRCNTIP (procalcitonin:4,lacticidven:4)  ) Recent Results (from the past 240 hour(s))  Culture, blood (x 2)     Status: None   Collection Time: 02/08/17  6:31 AM  Result Value Ref Range Status   Specimen Description   Final    BLOOD LEFT ARM Performed at [place] [address] [place], [place] [address]    Special Requests   Final    IN PEDIATRIC BOTTLE Blood Culture adequate volume Performed at [place] [address], [place], [place] [address]    Culture   Final    NO GROWTH 5 DAYS Performed at [facility], [place] [address], [place], [place] [address]  Report Status 02/13/2017 FINAL  Final  Culture, blood (x 2)     Status: None   Collection Time: 02/08/17  6:31 AM  Result Value Ref Range Status   Specimen Description   Final    BLOOD LEFT ARM Performed at [place] [address], [place], [place] [address]    Special Requests   Final    IN PEDIATRIC BOTTLE Blood Culture adequate volume Performed at [place] [address] [place], [place] [address]    Culture   Final    NO GROWTH 5 DAYS Performed at [facility], [place] [address] [place], [place] [address]    Report Status 02/13/2017 FINAL  Final  MRSA PCR Screening     Status: Abnormal   Collection Time: 02/08/17  3:41 PM  Result Value Ref Range Status   MRSA by PCR POSITIVE (A) NEGATIVE Final    Comment:        The GeneXpert MRSA Assay (FDA approved for NASAL specimens only), is one component of a comprehensive MRSA colonization surveillance program. It is not intended to diagnose MRSA infection nor to guide or monitor treatment for MRSA infections. RESULT CALLED TO, READ BACK BY AND VERIFIED WITHLoletha Grayer PHILLIPS,RN [id number] @ 3149 BY J SCOTTON Performed at [facility], [place] [address], [place], [place] [address]          Radiology Studies: Dg Chest Portable 1 View  Result Date: 02/12/2017 CLINICAL DATA:  Shortness of Breath EXAM: PORTABLE CHEST 1 VIEW COMPARISON:  Chest CT May 31, 2015; chest radiograph February 07, 2017 FINDINGS: There is marked deviation of the trachea toward the right with narrowing of the trachea. Prior CT shows marked thyroid enlargement as the cause for this appearance. There is bibasilar airspace consolidation with small left pleural effusion. There are several calcified granulomas in the left upper lobe. Heart is mildly enlarged with pulmonary vascularity normal. No adenopathy. There is aortic atherosclerosis. Bones are osteoporotic with degenerative change in each shoulder. IMPRESSION: 1. Marked thyroid enlargement with marked deviation of the thoracic trachea toward the right with thoracic tracheal narrowing, stable. 2. Patchy consolidation both lung bases. A degree of bibasilar pneumonia must be of concern. 3.  Small left pleural effusion. 4.  Stable cardiac prominence. 5.  Small granulomas left upper lobe. 6.  Aortic atherosclerosis. 7.  Bones osteoporotic. These results will be called to the ordering clinician or representative by the Radiologist Assistant, and communication documented in the PACS or zVision Dashboard. Electronically Signed   By: Lowella Grip III M.D.   On: 02/12/2017 14:06        Scheduled Meds: . acidophilus  1 capsule Oral Daily  . furosemide  40 mg Intravenous Daily  . hydroxyurea  500 mg Oral Once per day on Mon Tue Wed Thu Fri   And  . hydroxyurea  1,000 mg Oral Once per day on Sun Sat  . [START ON 02/14/2017] methimazole  5 mg Oral Once per day on Sun Thu  . pantoprazole sodium  40 mg Oral Daily  . polyethylene glycol  17 g Oral BID  . pregabalin  25 mg Oral BID  . propranolol  5 mg Oral BID  . Warfarin - Pharmacist Dosing Inpatient   Does not apply q1800   Continuous Infusions: . ceFEPime (MAXIPIME) IV Stopped  (02/13/17 1657)  . vancomycin       LOS: 1 day    Time spent: 33 minutes  Barbette Merino, MD [facility] Pager [phone]  If 7PM-7AM, please contact night-coverage [email or web address] Password [facility] 02/13/2017, 6:03 PM

## 2017-02-13 NOTE — Evaluation (Signed)
Clinical/Bedside Swallow Evaluation Patient Details  Name: Sandy Barrett MRN: [id number] Date of Birth: 1932/04/24  Today's Date: 02/13/2017 Time: SLP Start Time (ACUTE ONLY): 24 SLP Stop Time (ACUTE ONLY): 1802 SLP Time Calculation (min) (ACUTE ONLY): 34 min  Past Medical History:  Past Medical History:  Diagnosis Date  . ARTHRITIS   . Arthritis   . Diverticulosis   . Essential thrombocytosis ([place])   . HYPERTENSION   . HYPERTHYROIDISM   . Hyperthyroidism    s/p I-131 ablation 03/2011 of multinod goiter  . INSOMNIA   . MIGRAINE HEADACHE   . OBESITY   . Posttraumatic stress disorder   . Pulmonary embolism ([place]) 03/2014   with DVT  . Stroke [facility]) 03/2014   dysarthria   Past Surgical History:  Past Surgical History:  Procedure Laterality Date  . ABDOMINAL HYSTERECTOMY  1976  . BREAST SURGERY     biopsy  . RADIOLOGY WITH ANESTHESIA Left 03/08/2014   Procedure: RADIOLOGY WITH ANESTHESIA;  Surgeon: Rob Hickman, MD;  Location: [facility];  Service: Radiology;  Laterality: Left;  . TONSILLECTOMY    . TOTAL HIP ARTHROPLASTY  1998    right   HPI:  AlbertaBrothersis a60 y.o.female,w hypertension, CVAw right sided hemiplegia and aphasia, Essential thrombocytosis,PE, hyperthyroidism, c/o Congestion and subjective fever and dyspnea; admittedon 2/8/2019with pneumonia. MBS 04/21/16 revealed mild oral/pharyngeal dysphagia, suspected primary esophageal dysphagia. Scan of esophagus revealed stasis within the mid-esophagus and questionable stenosis. Dys 3, thin liquids, meds crushed in puree was recommended. Barium Swallow 04/23/16 revealed large substernal goiter causing marked displacement of the esophagus to the right, lack of primary peristalsis resulting in esophageal stasis. CXR revealed patchy consolidation both lung bases.    Assessment / Plan / Recommendation Clinical Impression   Patient presents with chronic mild oropharyngeal dysphagia and primary esophageal dysphagia.  Bedside swallowing evaluation completed; pt exhibited no overt signs of aspiration with thin liquids, purees or soft solids, which is her baseline diet per daughter. Pt is impulsive when self-feeding, taking rapid, large bites, though with extended time oral clearance is adequate. Mild right anterior spillage when self-feeding via cup and spoon. Her daughter reports family assists pt and cues her to take small bites and alternate with sips of liquid. Recommend dys 3 with thin liquids, esophageal precautions and assistance for slow rate and alternating solids and liquids. Pt's tolerance of upright positioning in bed is limited by pain. Her daughter reports pt typically sits in her wheelchair for meals. Daughter does request education about "the best thing for her to eat if she is lying in bed." SLP educated re: importance of upright positioning for airway protection and for esophageal clearance. Will follow briefly for tolerance and pt/caregiver education.    SLP Visit Diagnosis: Dysphagia, unspecified (R13.10)    Aspiration Risk  Mild aspiration risk;Moderate aspiration risk    Diet Recommendation Dysphagia 3 (Mech soft);Thin liquid    Liquid Administration via: Cup;Straw Medication Administration: Crushed with puree Supervision: Full supervision/cueing for compensatory strategies Compensations: Slow rate;Small sips/bites;Follow solids with liquid Postural Changes: Other (Comment);Remain upright for at least 30 minutes after po intake(Out of bed for meals if possible, otherwise position upright)    Other  Recommendations Oral Care Recommendations: Oral care BID   Follow up Recommendations None      Frequency and Duration min 1 x/week  1 week       Prognosis Prognosis for Safe Diet Advancement: Fair Barriers to Reach Goals: Time post onset;Language deficits      Swallow  Study   General Date of Onset: 02/12/17 HPI: AlbertaBrothersis a84 y.o.female,w hypertension, CVAw right sided  hemiplegia and aphasia, Essential thrombocytosis,PE, hyperthyroidism, c/o Congestion and subjective fever and dyspnea; admittedon 2/8/2019with pneumonia. MBS 04/21/16 revealed mild oral/pharyngeal dysphagia, suspected primary esophageal dysphagia. Scan of esophagus revealed stasis within the mid-esophagus and questionable stenosis. Dys 3, thin liquids, meds crushed in puree was recommended. Barium Swallow 04/23/16 revealed large substernal goiter causing marked displacement of the esophagus to the right, lack of primary peristalsis resulting in esophageal stasis. CXR revealed patchy consolidation both lung bases.  Type of Study: Bedside Swallow Evaluation Previous Swallow Assessment: see HPI Diet Prior to this Study: NPO Temperature Spikes Noted: No Respiratory Status: Nasal cannula History of Recent Intubation: No Behavior/Cognition: Alert;Cooperative;Pleasant mood Oral Cavity Assessment: Within Functional Limits Oral Care Completed by SLP: No Oral Cavity - Dentition: Dentures, top;Dentures, bottom Vision: Functional for self-feeding Self-Feeding Abilities: Able to feed self;Other (Comment);Needs assist(assist needed for precautions) Patient Positioning: Upright in bed Baseline Vocal Quality: Normal Volitional Cough: Cognitively unable to elicit Volitional Swallow: Unable to elicit    Oral/Motor/Sensory Function Overall Oral Motor/Sensory Function: Mild impairment Facial ROM: Reduced right Facial Symmetry: Abnormal symmetry right Facial Strength: Reduced right Lingual ROM: Within Functional Limits Lingual Symmetry: Within Functional Limits Lingual Strength: Reduced Velum: Within Functional Limits Mandible: Within Functional Limits   Ice Chips Ice chips: Within functional limits   Thin Liquid Thin Liquid: Impaired Presentation: Cup;Straw Oral Phase Impairments: Reduced labial seal Oral Phase Functional Implications: Right anterior spillage    Nectar Thick Nectar Thick Liquid: Not  tested   Honey Thick Honey Thick Liquid: Not tested   Puree Puree: Within functional limits Presentation: Spoon;Self Fed   Solid   GO   Solid: Impaired Oral Phase Impairments: Impaired mastication Oral Phase Functional Implications: Impaired mastication;Oral residue       Deneise Lever, MS, CCC-SLP Speech-Language Pathologist [phone]  Aliene Altes 02/13/2017,6:13 PM

## 2017-02-13 NOTE — Progress Notes (Signed)
  Echocardiogram 2D Echocardiogram has been performed.  Jennette Dubin 02/13/2017, 9:01 AM

## 2017-02-13 NOTE — Progress Notes (Signed)
ANTICOAGULATION CONSULT NOTE - Follow Up Consult  Pharmacy Consult for warfarin Indication: hx pulmonary embolus and DVT  Allergies  Allergen Reactions  . Valtrex [Valacyclovir Hcl] Cough    Cough and gagging    Patient Measurements:   Heparin Dosing Weight:   Vital Signs: BP: 125/84 (02/09 1004) Pulse Rate: 94 (02/09 1004)  Labs: Recent Labs    02/12/17 1531 02/13/17 0554  HGB 13.0  --   HCT 40.1  --   PLT 412*  --   LABPROT 30.4* 33.0*  INR 2.94 3.26  CREATININE 0.32* 0.32*  TROPONINI 0.10*  --     Estimated Creatinine Clearance: 50.2 mL/min (A) (by C-G formula based on SCr of 0.32 mg/dL (L)).   Medications:  PTA warfarin regimen: 2mg  daily except 4mg  on Mon/Fri (last dose 2/7 10PM)  Assessment: Patient's an 84 y.o F with hx essential thrombocytosis and  VTE on warfarin PTA, presented to the ED on 02/12/17 with c/o SOB and arm swelling.  To resume warfarin inpatient.  Today, 02/13/2017: - INR is supra-therapeutic at 3.26 - no new cbc today - no bleeding documented - drug-drug intxns: being on abx can make patient more sensitive to warfarin (currently on vancomycin and cefepime)  Goal of Therapy:  INR 2-3 Monitor platelets by anticoagulation protocol: Yes   Plan:  - hold warfarin today - f/u with INR in AM and redose if/when appropriate - monitor for s/s bleeding  Wilfrid Hyser P 02/13/2017,10:25 AM

## 2017-02-13 NOTE — Consult Note (Signed)
CARDIOLOGY CONSULT NOTE       Patient ID: Sandy Barrett MRN: [id number] DOB/AGE: 02/11/1932 84 y.o.  Admit date: 02/12/2017 Referring Physician: Maudie Mercury Primary Physician: [name], MD Primary Cardiologist: None Reason for Consultation: Elevated troponin   Active Problems:   Pneumonia   HCAP (healthcare-associated pneumonia)   HPI:  84 y.o. bed to wheel chair bound due to large stroke 2016. Followed for thrombocytosis And anticoagulated due to this and previous bilateral femoral vein DVT. She has very tenuous Oral airway protection with dense hemiparesis on right and aphasia. Cough, congestion and  More dyspnea Daughter indicates LUE more swollen Right is always very swollen. No chest Pain palpitations . She only goes from bed to wheel chair and can feed herself with left hand No history of MI, CAD  ROS All other systems reviewed and negative except as noted above  Past Medical History:  Diagnosis Date  . ARTHRITIS   . Arthritis   . Diverticulosis   . Essential thrombocytosis ([place])   . HYPERTENSION   . HYPERTHYROIDISM   . Hyperthyroidism    s/p I-131 ablation 03/2011 of multinod goiter  . INSOMNIA   . MIGRAINE HEADACHE   . OBESITY   . Posttraumatic stress disorder   . Pulmonary embolism ([place]) 03/2014   with DVT  . Stroke [facility]) 03/2014   dysarthria    Family History  Problem Relation Age of Onset  . Asthma Mother   . Asthma Father   . Prostate cancer Father   . Stroke Brother   . Breast cancer Sister   . Stomach cancer Sister   . Thyroid disease Neg Hx     Social History   Socioeconomic History  . Marital status: Widowed    Spouse name: Not on file  . Number of children: 2  . Years of education: 52  . Highest education level: Not on file  Social Needs  . Financial resource strain: Not on file  . Food insecurity - worry: Not on file  . Food insecurity - inability: Not on file  . Transportation needs - medical: Not on file  . Transportation needs -  non-medical: Not on file  Occupational History  . Occupation: retired    Comment: Restaurant manager, fast food  Tobacco Use  . Smoking status: Never Smoker  . Smokeless tobacco: Never Used  Substance and Sexual Activity  . Alcohol use: No    Alcohol/week: 0.0 oz  . Drug use: No  . Sexual activity: No  Other Topics Concern  . Not on file  Social History Narrative   Widowed, lived alone prior to CVA 03/2014   SNF at [facility], then home 07/2014 with dtr   Right handed   Caffeine use- occasionally drinks tea    Past Surgical History:  Procedure Laterality Date  . ABDOMINAL HYSTERECTOMY  1976  . BREAST SURGERY     biopsy  . RADIOLOGY WITH ANESTHESIA Left 03/08/2014   Procedure: RADIOLOGY WITH ANESTHESIA;  Surgeon: Rob Hickman, MD;  Location: [place];  Service: Radiology;  Laterality: Left;  . TONSILLECTOMY    . TOTAL HIP ARTHROPLASTY  1998    right     . acidophilus  1 capsule Oral Daily  . hydroxyurea  500 mg Oral Once per day on Mon Tue Wed Thu Fri   And  . hydroxyurea  1,000 mg Oral Once per day on Sun Sat  . [START ON 02/14/2017] methimazole  5 mg Oral Once per day on Sun  Thu  . pantoprazole sodium  40 mg Oral Daily  . polyethylene glycol  17 g Oral BID  . pregabalin  25 mg Oral BID  . propranolol  5 mg Oral BID  . Warfarin - Pharmacist Dosing Inpatient   Does not apply q1800   . ceFEPime (MAXIPIME) IV Stopped (02/13/17 0247)  . vancomycin      Physical Exam: Blood pressure (!) 125/95, pulse 94, temperature 98 F (36.7 C), temperature source Oral, resp. rate 19, SpO2 100 %.   Obese black female  Healthy:  Marked thyromegaly with deviated trachea HEENT: normal Neck supple with no adenopathy JVP normal no bruits no thyromegaly Lungs poor inspiratory effort diffuse rhonchi Heart:  S1/S2 no murmur, no rub, gallop or click PMI normal Abdomen: benighn, BS positve, no tenderness, no AAA no bruit.  No HSM or HJR Distal pulses intact with no bruits Marked RUE edema  mild LUE  Aphasic with difficulty clearing voice right hemiplegia  Skin warm and dry   Labs:   Lab Results  Component Value Date   WBC 9.2 02/12/2017   HGB 13.0 02/12/2017   HCT 40.1 02/12/2017   MCV 116.6 (H) 02/12/2017   PLT 412 (H) 02/12/2017    Recent Labs  Lab 02/12/17 1531 02/13/17 0554  NA 143  --   K 3.4*  --   CL 108  --   CO2 29  --   BUN 9  --   CREATININE 0.32* 0.32*  CALCIUM 8.4*  --   PROT 5.7*  --   BILITOT 0.9  --   ALKPHOS 87  --   ALT 43  --   AST 18  --   GLUCOSE 102*  --    Lab Results  Component Value Date   TROPONINI 0.10 (HH) 02/12/2017    Lab Results  Component Value Date   CHOL 95 03/09/2014   CHOL 126 08/07/2013   CHOL 133 08/31/2012   Lab Results  Component Value Date   HDL 31 (L) 03/09/2014   HDL 51.20 08/07/2013   HDL 55.50 08/31/2012   Lab Results  Component Value Date   LDLCALC 53 03/09/2014   LDLCALC 67 08/07/2013   LDLCALC 72 08/31/2012   Lab Results  Component Value Date   TRIG 54 03/09/2014   TRIG 39.0 08/07/2013   TRIG 29.0 08/31/2012   Lab Results  Component Value Date   CHOLHDL 3.1 03/09/2014   CHOLHDL 2 08/07/2013   CHOLHDL 2 08/31/2012   No results found for: LDLDIRECT    Radiology:  CXR:   thyroid enlargement with tracheal deviation small left effusion bibasilar pneumonia   EKG: SR low voltage no acute changes    ASSESSMENT AND PLAN:  1. Elevated troponin - not significant needs no further w/u 2. Abnormal ECG: likely due to body habitus low voltage echo to r/o pericardial effusion 3. Dyspnea: likely from chronic aspiration CXR likely bibasilar pneumonia BNP 899 echo to assess EF daily lasix given UE edema 4. Stroke:  Needs repeat swallowing study I suspect she would do best with PEG  5. Thrombocytosis: on urea follow counts continue coumadin INR Rx 2.94 6. Thyroid:  Marked thyromegaly with tracheal deviation chronic does not help with Aspiration issues on tapazole    Signed: Jenkins Rouge 02/13/2017, 8:55 AM

## 2017-02-14 DIAGNOSIS — I5043 Acute on chronic combined systolic (congestive) and diastolic (congestive) heart failure: Secondary | ICD-10-CM

## 2017-02-14 LAB — BASIC METABOLIC PANEL
Anion gap: 7 (ref 5–15)
BUN: 8 mg/dL (ref 6–20)
CO2: 27 mmol/L (ref 22–32)
Calcium: 8.1 mg/dL — ABNORMAL LOW (ref 8.9–10.3)
Chloride: 110 mmol/L (ref 101–111)
Creatinine, Ser: <0.3 mg/dL — ABNORMAL LOW (ref 0.44–1.00)
Glucose, Bld: 92 mg/dL (ref 65–99)
Potassium: 2.8 mmol/L — ABNORMAL LOW (ref 3.5–5.1)
Sodium: 144 mmol/L (ref 135–145)

## 2017-02-14 LAB — CBC WITH DIFFERENTIAL/PLATELET
Basophils Absolute: 0.1 10*3/uL (ref 0.0–0.1)
Basophils Relative: 1 %
Eosinophils Absolute: 0.2 10*3/uL (ref 0.0–0.7)
Eosinophils Relative: 3 %
HCT: 37.4 % (ref 36.0–46.0)
Hemoglobin: 12.2 g/dL (ref 12.0–15.0)
Lymphocytes Relative: 14 %
Lymphs Abs: 1 10*3/uL (ref 0.7–4.0)
MCH: 38.1 pg — ABNORMAL HIGH (ref 26.0–34.0)
MCHC: 32.6 g/dL (ref 30.0–36.0)
MCV: 116.9 fL — ABNORMAL HIGH (ref 78.0–100.0)
Monocytes Absolute: 0.7 10*3/uL (ref 0.1–1.0)
Monocytes Relative: 10 %
Neutro Abs: 5 10*3/uL (ref 1.7–7.7)
Neutrophils Relative %: 72 %
Platelets: 460 10*3/uL — ABNORMAL HIGH (ref 150–400)
RBC: 3.2 MIL/uL — ABNORMAL LOW (ref 3.87–5.11)
RDW: 15.2 % (ref 11.5–15.5)
WBC: 7 10*3/uL (ref 4.0–10.5)

## 2017-02-14 LAB — STREP PNEUMONIAE URINARY ANTIGEN: Strep Pneumo Urinary Antigen: NEGATIVE

## 2017-02-14 LAB — PROTIME-INR
INR: 3.1
Prothrombin Time: 31.7 seconds — ABNORMAL HIGH (ref 11.4–15.2)

## 2017-02-14 MED ORDER — GUAIFENESIN ER 600 MG PO TB12
600.0000 mg | ORAL_TABLET | Freq: Two times a day (BID) | ORAL | Status: DC
  Filled 2017-02-14: qty 1

## 2017-02-14 MED ORDER — POTASSIUM CHLORIDE 20 MEQ/15ML (10%) PO SOLN
40.0000 meq | ORAL | Status: AC
  Administered 2017-02-14 (×2): 40 meq via ORAL
  Filled 2017-02-14 (×2): qty 30

## 2017-02-14 MED ORDER — GUAIFENESIN 100 MG/5ML PO SOLN
5.0000 mL | Freq: Two times a day (BID) | ORAL | Status: DC
  Administered 2017-02-14 – 2017-02-17 (×7): 100 mg via ORAL
  Filled 2017-02-14 (×7): qty 10

## 2017-02-14 NOTE — Progress Notes (Signed)
PT Cancellation Note  Patient Details Name: Sandy Barrett MRN: [id number] DOB: 04-Mar-1932   Cancelled Treatment:    Reason Eval/Treat Not Completed: PT screened, no needs identified, will sign off. Order received. Chart reviewed. Pt is total assist at baseline-requires hoyer lift for transfers. Recommend OOB to recliner with hoyer lilft with nursing. Will sign off.   Weston Anna, MPT Pager: [phone]

## 2017-02-14 NOTE — Progress Notes (Signed)
ANTICOAGULATION CONSULT NOTE - Follow Up Consult  Pharmacy Consult for warfarin Indication: hx pulmonary embolus, DVT and stroke  Allergies  Allergen Reactions  . Valtrex [Valacyclovir Hcl] Cough    Cough and gagging    Patient Measurements: Height: 5' (152.4 cm) Weight: 169 lb 5 oz (76.8 kg) IBW/kg (Calculated) : 45.5  Vital Signs: Temp: 97.5 F (36.4 C) (02/10 0523) Temp Source: Oral (02/10 0523) BP: 135/73 (02/10 0523) Pulse Rate: 92 (02/10 0523)  Labs: Recent Labs    02/12/17 1531 02/13/17 0554 02/14/17 0505  HGB 13.0  --  12.2  HCT 40.1  --  37.4  PLT 412*  --  460*  LABPROT 30.4* 33.0* 31.7*  INR 2.94 3.26 3.10  CREATININE 0.32* 0.32* <0.30*  TROPONINI 0.10*  --   --     CrCl cannot be calculated (This lab value cannot be used to calculate CrCl because it is not a number: <0.30).   Medications:  PTA warfarin regimen: 2mg  daily except 4mg  on Mon/Fri (last dose 2/7 10PM)  Assessment: Patient's an 84 y.o F with hx essential thrombocytosis, VTE, and stroke on warfarin PTA, presented to the ED on 02/12/17 with c/o SOB and arm swelling.  To resume warfarin inpatient.  Today, 02/14/2017: - INR remains supra-therapeutic at 3.1, but decreased  - Note the INR became elevated after reduced dose of 2mg  given on 2/8 PM - CBC: Hgb remains WNL, Plt remain elevated - No bleeding documented - Drug-drug intxns: being on abx can make patient more sensitive to warfarin (currently on vancomycin and cefepime)  Goal of Therapy:  INR 2-3 Monitor platelets by anticoagulation protocol: Yes   Plan:  - Continue to hold warfarin today - F/u with INR in AM and redose if/when appropriate - monitor for s/s bleeding  Gretta Arab PharmD, BCPS Pager [phone] 02/14/2017 9:04 AM

## 2017-02-14 NOTE — Progress Notes (Signed)
  Clarified with [facility], pt is not active. She was readmitted prior to being seen. Will need a new set of [place] orders with HHPT, RN, aide and SW (added if dc home).   Case Management Note  Patient Details  Name: Sandy Barrett MRN: [id number] Date of Birth: [birth year]  CM contacted by Lattie Haw with [facility] who advised pt is active with [facility] RN PT NA.  Will follow for needs.  Expected Discharge Date:   Unknown                   Expected Discharge Plan:  [place] Choice:  Home Health Choice offered to:  Patient  HH Arranged:  RN, PT, Nurse's Aide [place] Agency:  [facility] ([facility]  Status of Service:  In process, will continue to follow  Rae Mar, RN 02/12/2017, 1:20 PM

## 2017-02-14 NOTE — Progress Notes (Signed)
Suction canister ordered from portable equipment.  Per portable currently do not have any in stock, will bring one up when one becomes available.

## 2017-02-14 NOTE — Progress Notes (Signed)
Progress Note  Patient Name: Sandy Barrett Date of Encounter: 02/14/2017  Primary Cardiologist: Johnsie Cancel  Subjective   No cardiac complaints per daughter   Inpatient Medications    Scheduled Meds: . acidophilus  1 capsule Oral Daily  . furosemide  40 mg Intravenous Daily  . hydroxyurea  500 mg Oral Once per day on Mon Tue Wed Thu Fri   And  . hydroxyurea  1,000 mg Oral Once per day on Sun Sat  . methimazole  5 mg Oral Once per day on Sun Thu  . pantoprazole sodium  40 mg Oral Daily  . polyethylene glycol  17 g Oral BID  . potassium chloride  40 mEq Oral Q4H  . pregabalin  25 mg Oral BID  . propranolol  5 mg Oral BID  . Warfarin - Pharmacist Dosing Inpatient   Does not apply q1800   Continuous Infusions: . sodium chloride 75 mL/hr at 02/13/17 1807  . ceFEPime (MAXIPIME) IV Stopped (02/13/17 2254)  . vancomycin Stopped (02/13/17 1923)   PRN Meds: ipratropium-albuterol, traMADol, traZODone   Vital Signs    Vitals:   02/13/17 1326 02/13/17 1600 02/13/17 2049 02/14/17 0523  BP: (!) 105/54 (!) 155/81 (!) 153/94 135/73  Pulse: 90 93 94 92  Resp: (!) 24 20 20 18   Temp:  97.9 F (36.6 C) 98.3 F (36.8 C) (!) 97.5 F (36.4 C)  TempSrc:  Oral Oral Oral  SpO2: 100% 100% 100% 98%  Weight:  169 lb 5 oz (76.8 kg)    Height:  5' (1.524 m)      Intake/Output Summary (Last 24 hours) at 02/14/2017 0834 Last data filed at 02/14/2017 5176 Gross per 24 hour  Intake 2170 ml  Output 1100 ml  Net 1070 ml   Filed Weights   02/13/17 1600  Weight: 169 lb 5 oz (76.8 kg)    Telemetry    NSR 02/14/2017  - Personally Reviewed  ECG    NSR low voltage  - Personally Reviewed  Physical Exam  Obese black female  GEN: No acute distress.   Neck: No JVD Cardiac: RRR, no murmurs, rubs, or gallops.  Respiratory: Clear to auscultation bilaterally. GI: Soft, nontender, non-distended  MS: No edema; No deformity. Neuro:  Aphasic with right hemiparesis and edema in both UE;s right  worse  Psych: Normal affect   Labs    Chemistry Recent Labs  Lab 02/08/17 0631 02/09/17 0440  02/12/17 1531 02/13/17 0554 02/14/17 0505  NA 141 142  --  143  --  144  K 3.6 3.5  --  3.4*  --  2.8*  CL 110 111  --  108  --  110  CO2 24 23  --  29  --  27  GLUCOSE 119* 93  --  102*  --  92  BUN 13 9  --  9  --  8  CREATININE 0.35* <0.30*   < > 0.32* 0.32* <0.30*  CALCIUM 8.5* 8.2*  --  8.4*  --  8.1*  PROT 5.7*  5.8* 5.0*  --  5.7*  --   --   ALBUMIN 2.8*  2.9* 2.6*  --  2.7*  --   --   AST 87*  92* 39  --  18  --   --   ALT 197*  198* 122*  --  43  --   --   ALKPHOS 131*  129* 104  --  87  --   --  BILITOT 2.9*  2.8* 2.1*  --  0.9  --   --   GFRNONAA >60 NOT CALCULATED   < > >60 >60 NOT CALCULATED  GFRAA >60 NOT CALCULATED   < > >60 >60 NOT CALCULATED  ANIONGAP 7 8  --  6  --  7   < > = values in this interval not displayed.     Hematology Recent Labs  Lab 02/09/17 0529 02/12/17 1531 02/14/17 0505  WBC 8.4 9.2 7.0  RBC 3.20* 3.44* 3.20*  HGB 12.2 13.0 12.2  HCT 37.7 40.1 37.4  MCV 117.8* 116.6* 116.9*  MCH 38.1* 37.8* 38.1*  MCHC 32.4 32.4 32.6  RDW 15.7* 15.4 15.2  PLT 388 412* 460*    Cardiac Enzymes Recent Labs  Lab 02/12/17 1531  TROPONINI 0.10*   No results for input(s): TROPIPOC in the last 168 hours.   BNP Recent Labs  Lab 02/12/17 1531  BNP 899.5*     DDimer No results for input(s): DDIMER in the last 168 hours.   Radiology    Dg Chest Portable 1 View  Result Date: 02/12/2017 CLINICAL DATA:  Shortness of Breath EXAM: PORTABLE CHEST 1 VIEW COMPARISON:  Chest CT May 31, 2015; chest radiograph February 07, 2017 FINDINGS: There is marked deviation of the trachea toward the right with narrowing of the trachea. Prior CT shows marked thyroid enlargement as the cause for this appearance. There is bibasilar airspace consolidation with small left pleural effusion. There are several calcified granulomas in the left upper lobe. Heart is mildly  enlarged with pulmonary vascularity normal. No adenopathy. There is aortic atherosclerosis. Bones are osteoporotic with degenerative change in each shoulder. IMPRESSION: 1. Marked thyroid enlargement with marked deviation of the thoracic trachea toward the right with thoracic tracheal narrowing, stable. 2. Patchy consolidation both lung bases. A degree of bibasilar pneumonia must be of concern. 3.  Small left pleural effusion. 4.  Stable cardiac prominence. 5.  Small granulomas left upper lobe. 6.  Aortic atherosclerosis. 7.  Bones osteoporotic. These results will be called to the ordering clinician or representative by the Radiologist Assistant, and communication documented in the PACS or zVision Dashboard. Electronically Signed   By: Lowella Grip III M.D.   On: 02/12/2017 14:06    Cardiac Studies   Echo EF 60-65%   Patient Profile     84 y.o. female chair bound due to stroke 2016 thrombocytosis bilateral femoral vein DVT on anticoagulation admitted with cough congestion and dyspnea No cardiac history  Assessment & Plan    Elevated Troponin : no chest pain no acute ECG changes no evolution minimal elevation .10 no Further w/u indicated Echo with normal EF no RWMA;s  Dyspnea Minimal elevation BNP 899 EF normal by echo bigger concern is chronic aspiration From previous stroke and poor airway control CXR patchy pneumonia both bases small left Effusion continue maxipimen and daily lasix for now   For questions or updates, please contact [facility] Please consult [email or web address] for contact info under Cardiology/STEMI.      Signed, Jenkins Rouge, MD  02/14/2017, 8:34 AM

## 2017-02-14 NOTE — Progress Notes (Signed)
PROGRESS NOTE  Sandy Barrett XBD:[id number] DOB: Aug 25, 1932 DOA: 02/12/2017 PCP: [name], MD  HPI/Recap of past 24 hours:  She is aphasic,  She has congested cough, she is too weak to cough up, no fever She is on 2 L of oxygen, baseline is not oxygen dependent Right upper extremity edema  Daughter at bedside  Assessment/Plan: Active Problems:   Pneumonia   HCAP (healthcare-associated pneumonia)   Sepsis ([place])   healthcare associated pneumonia vs aspiration pneumonia -She was hospitalized from February 3 to December to 6 for pneumonia and cholecystitis.  -flu negative, blood culture no growth, sputum culture pending collection -Chest x-ray from admission personally reviewed, "Patchy consolidation both lung bases. A degree of bibasilar pneumonia must be of concern.  Small left pleural effusion" - will continue  IV cefepime and vancomycin for now. Add mucinex, d/c ivf, wean oxygen -speech therapy "Dysphagia 3 (Mech soft);Thin liquid    Liquid Administration via: Cup;Straw Medication Administration: Crushed with puree Supervision: Full supervision/cueing for compensatory strategies Compensations: Slow rate;Small sips/bites;Follow solids with liquid Postural Changes: Other (Comment);Remain upright for at least 30 minutes after po intake(Out of bed for meals if possible, otherwise position upright) "  -Daughter wants to try diet modification, behavioral maneuver, before considering feeding tube placement.  She also mentioned to follow with endocrinology for goiter treatment.  She is hopeful that her goiter will shrink and that will also help the patient swallow.  Hypokalemia, replace K, mag  elevated troponin: Mild 0.1 , no chest pain . probably as a result of pneumonia.  Appreciate cardiology input.  Echocardiogram reviewed with no significant abnormalities   history of PE: Continue warfarin per pharmacy  essential thrombocytosis: Patient on hydroxyurea.  Continue  according to hematology Platelet 460  GERD: Continue PPIs  hypertension: Continue blood pressure control with  Propranolol, lasix  history of CVA: Resided right hemiplegia , aphasia, has been bed to wheelchair bound since 2016 .at baseline-requires hoyer lift for transfers.  She is able to feed herself.  She has 24/7 care at home.  Right arm edema, negative DVT from recent venous Doppler.  Been on Coumadin  likely dependent edema, elevate arm  hyperparathyroidism:  Marked thyroid enlargement with marked deviation of the thoracic trachea toward the right with thoracic tracheal narrowing, stable on x ray. Patient on Tapazole.  We'll continue treatment  Body mass index is 33.07 kg/m.   Code Status: Partial, yes to CPR and Defibrillation,  NO  intubation.   Family Communication: patient and daughter at bedside  Disposition Plan: home in 24-48hrs   Consultants:  cardiology  Procedures:  none  Antibiotics:  Vanc/cefepime from admission   Objective: BP 111/60 (BP Location: Left Arm)   Pulse 98   Temp 98 F (36.7 C) (Oral)   Resp 19   Ht 5' (1.524 m)   Wt 76.8 kg (169 lb 5 oz)   SpO2 98%   BMI 33.07 kg/m   Intake/Output Summary (Last 24 hours) at 02/14/2017 1437 Last data filed at 02/14/2017 1409 Gross per 24 hour  Intake 1437.5 ml  Output 1800 ml  Net -362.5 ml   Filed Weights   02/13/17 1600  Weight: 76.8 kg (169 lb 5 oz)    Exam: Patient is examined daily including today on 02/14/2017, exams remain the same as of yesterday except that has changed    General:  NAD, aphasia, right hemiplegia  Cardiovascular: RRR  Respiratory: +Rhonchi, + rales, no wheezing  Abdomen: Soft/ND/NT, positive BS  Musculoskeletal: right arm Edema  Neuro: alert, aphasia, right hemiplegia  Data Reviewed: Basic Metabolic Panel: Recent Labs  Lab 02/07/17 2319 02/08/17 0631 02/09/17 0440 02/10/17 0434 02/12/17 1531 02/13/17 0554 02/14/17 0505  NA 141 141 142   --  143  --  144  K 3.5 3.6 3.5  --  3.4*  --  2.8*  CL 109 110 111  --  108  --  110  CO2 27 24 23   --  29  --  27  GLUCOSE 134* 119* 93  --  102*  --  92  BUN 14 13 9   --  9  --  8  CREATININE 0.41* 0.35* <0.30* <0.30* 0.32* 0.32* <0.30*  CALCIUM 8.8* 8.5* 8.2*  --  8.4*  --  8.1*   Liver Function Tests: Recent Labs  Lab 02/07/17 2319 02/08/17 0631 02/09/17 0440 02/12/17 1531  AST 146* 87*  92* 39 18  ALT 252* 197*  198* 122* 43  ALKPHOS 154* 131*  129* 104 87  BILITOT 4.7* 2.9*  2.8* 2.1* 0.9  PROT 6.3* 5.7*  5.8* 5.0* 5.7*  ALBUMIN 3.1* 2.8*  2.9* 2.6* 2.7*   No results for input(s): LIPASE, AMYLASE in the last 168 hours. No results for input(s): AMMONIA in the last 168 hours. CBC: Recent Labs  Lab 02/07/17 2319 02/08/17 0631 02/09/17 0529 02/12/17 1531 02/14/17 0505  WBC 12.1* 11.4* 8.4 9.2 7.0  NEUTROABS 10.9* 10.0*  --  7.4 5.0  HGB 13.9 12.8 12.2 13.0 12.2  HCT 42.1 38.7 37.7 40.1 37.4  MCV 116.0* 115.5* 117.8* 116.6* 116.9*  PLT 415* 392 388 412* 460*   Cardiac Enzymes:   Recent Labs  Lab 02/12/17 1531  TROPONINI 0.10*   BNP (last 3 results) Recent Labs    02/12/17 1531  BNP 899.5*    ProBNP (last 3 results) No results for input(s): PROBNP in the last 8760 hours.  CBG: Recent Labs  Lab 02/08/17 2228 02/09/17 0757 02/09/17 1557 02/09/17 2312 02/10/17 0805  GLUCAP 99 82 76 107* 89    Recent Results (from the past 240 hour(s))  Culture, blood (x 2)     Status: None   Collection Time: 02/08/17  6:31 AM  Result Value Ref Range Status   Specimen Description   Final    BLOOD LEFT ARM Performed at [facility] [address] [place], [place] [address]    Special Requests   Final    IN PEDIATRIC BOTTLE Blood Culture adequate volume Performed at [facility] [address] [place], [place] [address]    Culture   Final    NO GROWTH 5 DAYS Performed at [facility], [place] [address] [place], [place] [address]    Report Status 02/13/2017 FINAL  Final  Culture, blood (x 2)     Status: None   Collection Time: 02/08/17  6:31 AM  Result Value Ref Range Status   Specimen Description   Final    BLOOD LEFT ARM Performed at [facility] [address], [place], [place] [address]    Special Requests   Final    IN PEDIATRIC BOTTLE Blood Culture adequate volume Performed at [facility] [address] [place], [place] [address]    Culture   Final    NO GROWTH 5 DAYS Performed at [facility], [place] [address], [place], [place] [address]    Report Status 02/13/2017 FINAL  Final  MRSA PCR Screening     Status: Abnormal   Collection Time: 02/08/17  3:41 PM  Result Value Ref Range Status   MRSA by PCR POSITIVE (A) NEGATIVE Final    Comment:        The GeneXpert MRSA Assay (FDA approved for NASAL specimens only), is one component of a comprehensive MRSA colonization surveillance program. It is not intended to diagnose MRSA infection nor to guide or monitor treatment for MRSA infections. RESULT CALLED TO, READ BACK BY AND VERIFIED WITHLoletha Grayer PHILLIPS,RN [id number] @ 0454 BY J SCOTTON Performed at [facility], [place] [address] [place], [place] [address]   Culture, blood (routine x 2) Call MD if unable to obtain prior to antibiotics being given     Status: None (Preliminary result)   Collection Time: 02/12/17  8:27 PM  Result Value Ref Range Status   Specimen Description   Final    BLOOD LEFT HAND Performed at [facility] [address] [place], [place] [address]    Special Requests   Final    IN PEDIATRIC BOTTLE Blood Culture adequate volume Performed at [facility] [address] [place], [place] [address]    Culture   Final    NO GROWTH 1 DAY Performed at [facility], [place] [address], [place], [place] [address]    Report Status PENDING  Incomplete   Culture, blood (routine x 2) Call MD if unable to obtain prior to antibiotics being given     Status: None (Preliminary result)   Collection Time: 02/12/17  8:36 PM  Result Value Ref Range Status   Specimen Description   Final    BLOOD LEFT FOREARM Performed at [facility] [address], [place], [place] [address]    Special Requests   Final    IN PEDIATRIC BOTTLE Blood Culture adequate volume Performed at [facility] [address], [place], [place] [address]    Culture   Final    NO GROWTH 1 DAY Performed at [facility], [place] [address], [place], [place] [address]    Report Status PENDING  Incomplete     Studies: No results found.  Scheduled Meds: . acidophilus  1 capsule Oral Daily  . furosemide  40 mg Intravenous Daily  . guaiFENesin  5 mL Oral BID  . hydroxyurea  500 mg Oral Once per day on Mon Tue Wed Thu Fri   And  . hydroxyurea  1,000 mg Oral Once per day on Sun Sat  . methimazole  5 mg Oral Once per day on Sun Thu  . pantoprazole sodium  40 mg Oral Daily  . polyethylene glycol  17 g Oral BID  . pregabalin  25 mg Oral BID  . propranolol  5 mg Oral BID  . Warfarin - Pharmacist Dosing Inpatient   Does not apply q1800    Continuous Infusions: . ceFEPime (MAXIPIME) IV 1 g (02/14/17 1401)  . vancomycin Stopped (02/13/17 1923)     Time spent: 35 mins  I have personally reviewed and interpreted on  02/14/2017 daily labs, tele strips, imagings as discussed above under date review session and assessment and plans.  I reviewed all nursing notes, pharmacy notes, consultant notes,  vitals, pertinent old records  I have discussed plan of care as described above with RN , patient and family on 02/14/2017   Florencia Reasons MD, PhD  [facility] Pager [phone]. If 7PM-7AM, please  contact night-coverage at [email or web address], password [facility] 02/14/2017, 2:37 PM  LOS: 2 days

## 2017-02-15 ENCOUNTER — Inpatient Hospital Stay: Payer: Medicare Other | Admitting: Internal Medicine

## 2017-02-15 LAB — CBC WITH DIFFERENTIAL/PLATELET
Basophils Absolute: 0 10*3/uL (ref 0.0–0.1)
Basophils Relative: 0 %
Eosinophils Absolute: 0.1 10*3/uL (ref 0.0–0.7)
Eosinophils Relative: 2 %
HCT: 39.1 % (ref 36.0–46.0)
Hemoglobin: 12.8 g/dL (ref 12.0–15.0)
Lymphocytes Relative: 15 %
Lymphs Abs: 1 10*3/uL (ref 0.7–4.0)
MCH: 38.8 pg — ABNORMAL HIGH (ref 26.0–34.0)
MCHC: 32.7 g/dL (ref 30.0–36.0)
MCV: 118.5 fL — ABNORMAL HIGH (ref 78.0–100.0)
Monocytes Absolute: 0.7 10*3/uL (ref 0.1–1.0)
Monocytes Relative: 10 %
Neutro Abs: 5.1 10*3/uL (ref 1.7–7.7)
Neutrophils Relative %: 73 %
Platelets: 474 10*3/uL — ABNORMAL HIGH (ref 150–400)
RBC: 3.3 MIL/uL — ABNORMAL LOW (ref 3.87–5.11)
RDW: 15.1 % (ref 11.5–15.5)
WBC: 6.9 10*3/uL (ref 4.0–10.5)

## 2017-02-15 LAB — PROTIME-INR
INR: 3.47
Prothrombin Time: 34.6 seconds — ABNORMAL HIGH (ref 11.4–15.2)

## 2017-02-15 LAB — BASIC METABOLIC PANEL
Anion gap: 12 (ref 5–15)
BUN: 7 mg/dL (ref 6–20)
CO2: 26 mmol/L (ref 22–32)
Calcium: 8.3 mg/dL — ABNORMAL LOW (ref 8.9–10.3)
Chloride: 108 mmol/L (ref 101–111)
Creatinine, Ser: 0.33 mg/dL — ABNORMAL LOW (ref 0.44–1.00)
GFR calc Af Amer: >60 mL/min (ref >60)
GFR calc non Af Amer: >60 mL/min (ref >60)
Glucose, Bld: 98 mg/dL (ref 65–99)
Potassium: 3.3 mmol/L — ABNORMAL LOW (ref 3.5–5.1)
Sodium: 146 mmol/L — ABNORMAL HIGH (ref 135–145)

## 2017-02-15 LAB — LEGIONELLA PNEUMOPHILA SEROGP 1 UR AG: L. pneumophila Serogp 1 Ur Ag: NEGATIVE

## 2017-02-15 LAB — MAGNESIUM: Magnesium: 1.6 mg/dL — ABNORMAL LOW (ref 1.7–2.4)

## 2017-02-15 MED ORDER — MAGNESIUM SULFATE 4 GM/100ML IV SOLN
4.0000 g | Freq: Once | INTRAVENOUS | Status: AC
  Administered 2017-02-15: 4 g via INTRAVENOUS
  Filled 2017-02-15: qty 100

## 2017-02-15 MED ORDER — SODIUM CHLORIDE 0.9 % IV SOLN
3.0000 g | Freq: Four times a day (QID) | INTRAVENOUS | Status: DC
  Administered 2017-02-15 – 2017-02-17 (×8): 3 g via INTRAVENOUS
  Filled 2017-02-15 (×12): qty 3

## 2017-02-15 MED ORDER — POTASSIUM CHLORIDE 20 MEQ/15ML (10%) PO SOLN
40.0000 meq | Freq: Once | ORAL | Status: AC
  Administered 2017-02-15: 40 meq via ORAL
  Filled 2017-02-15: qty 30

## 2017-02-15 MED ORDER — SODIUM CHLORIDE 0.9 % IV SOLN
1.0000 g | Freq: Three times a day (TID) | INTRAVENOUS | Status: DC
  Filled 2017-02-15: qty 1

## 2017-02-15 NOTE — Evaluation (Signed)
Occupational Therapy Evaluation Patient Details Name: Sandy Barrett MRN: [id number] DOB: Jun 12, 1932 Today's Date: 02/15/2017    History of Present Illness [name]  is a 84 y.o. female, w hypertension, CVA w right sided hemiplegia and aphasia, Essential thrombocytosis, PE,  hyperthyroidism, c/o  Congestion and subjective fever and dyspnea    Clinical Impression   Pt admitted with Pna. Pt currently with functional limitations due to the deficits listed below (see OT Problem List).  Pt will benefit from skilled OT to increase their safety and independence with ADL and functional mobility for ADL to facilitate discharge to venue listed below.      Follow Up Recommendations  Home health OT    Equipment Recommendations  None recommended by OT    Recommendations for Other Services       Precautions / Restrictions Precautions Precaution Comments: L side hemiplegia and LUE edema      Mobility Bed Mobility Overal bed mobility: Needs Assistance             General bed mobility comments: dependent  Transfers Overall transfer level: Needs assistance               General transfer comment: family uses hoyer        ADL either performed or assessed with clinical judgement   ADL Overall ADL's : Needs assistance/impaired                                       General ADL Comments: OT session focused on education with pts daughter regarding positioning, PROM for edema control.  Pts does grimace with PROM.  Daugther takes care of pt- she is dependent for ADL's .  Edema is significant in RUE     Vision Patient Visual Report: No change from baseline       Perception     Praxis      Pertinent Vitals/Pain Pain Assessment: Faces Pain Score: 3  Faces Pain Scale: Hurts a little bit Pain Location: grimace with RUE PROM Pain Descriptors / Indicators: Grimacing Pain Intervention(s): Monitored during session     Hand Dominance Right    Extremity/Trunk Assessment Upper Extremity Assessment Upper Extremity Assessment: RUE deficits/detail RUE Deficits / Details: significant edema           Communication Communication Communication: Receptive difficulties;Expressive difficulties   Cognition Arousal/Alertness: Awake/alert Behavior During Therapy: WFL for tasks assessed/performed Overall Cognitive Status: History of cognitive impairments - at baseline                                     General Comments       Exercises  PROM RUE.  Pt does grimace with all movements   Shoulder Instructions  Keep  RUE elevated    Home Living Family/patient expects to be discharged to:: Private residence Living Arrangements: Children Available Help at Discharge: Family;Available 24 hours/day Type of Home: House Home Access: Ramped entrance     Home Layout: One level     Bathroom Shower/Tub: Tub/shower unit         Home Equipment: Wheelchair - power;Bedside commode;Hospital bed;Other (comment)          Prior Functioning/Environment Level of Independence: Needs assistance  Gait / Transfers Assistance Needed: assistance for bed to chair transfers  ADL's / Homemaking Assistance Needed: assist for bed  bath and uses depends Communication / Swallowing Assistance Needed: expressive and receptive aphasia          OT Problem List: Decreased range of motion;Decreased coordination;Impaired UE functional use;Increased edema      OT Treatment/Interventions: Patient/family education;Therapeutic activities    OT Goals(Current goals can be found in the care plan section) Acute Rehab OT Goals Patient Stated Goal: daughter wants to know how to better A pt with her arm OT Goal Formulation: With family Time For Goal Achievement: 02/22/17  OT Frequency: Min 2X/week   Barriers to D/C:            Co-evaluation              AM-PAC PT "6 Clicks" Daily Activity     Outcome Measure Help from another  person eating meals?: A Lot Help from another person taking care of personal grooming?: Total Help from another person toileting, which includes using toliet, bedpan, or urinal?: Total Help from another person bathing (including washing, rinsing, drying)?: Total Help from another person to put on and taking off regular upper body clothing?: Total Help from another person to put on and taking off regular lower body clothing?: Total 6 Click Score: 7   End of Session Nurse Communication: Need for lift equipment  Activity Tolerance: Patient tolerated treatment well Patient left: in bed;with call bell/phone within reach;with family/visitor present  OT Visit Diagnosis: Hemiplegia and hemiparesis Hemiplegia - Right/Left: Right Hemiplegia - dominant/non-dominant: Dominant Hemiplegia - caused by: Cerebral infarction                Time: 3664-4034 OT Time Calculation (min): 18 min Charges:  OT Evaluation $OT Eval Moderate Complexity: 1 Mod G-Codes:     Kari Baars, OT [phone]  Payton Mccallum D 02/15/2017, 7:33 PM

## 2017-02-15 NOTE — Progress Notes (Signed)
Pharmacy Antibiotic Note  Sandy Barrett is a 84 y.o. female admitted on 02/12/2017 with pneumonia.  Pharmacy has been consulted for vancomycin dosing. Cefepime order per MD.  Recent admission for cholecystitis (managed conservatively with antibiotics [augmentin]) and aspiration pneumonia.  Today, 02/15/2017 Day #3 antibiotics  Renal: SCr appears stable (SCr dec - suspect d/t dec muscle mass d/t age and being wheelchair bound).   CBC: WBC WNL  Afebrile  Microbiology results unrevealing  Plan:  Vancomycin 750mg  IV q24h  Cefepime 1gm IV q8h  Monitor SCr  Check vancomycin levels as indicated  Will need to check levels if remains on vancomycin beyond 2/12  Microbiology results unrevealing, suggest de-escalating antibiotics (ex. Stop vancomycin)   Temp (24hrs), Avg:97.9 F (36.6 C), Min:97.4 F (36.3 C), Max:98.2 F (36.8 C)  Recent Labs  Lab 02/09/17 0529 02/10/17 0434 02/12/17 1528 02/12/17 1531 02/13/17 0554 02/14/17 0505 02/15/17 0512  WBC 8.4  --   --  9.2  --  7.0 6.9  CREATININE  --  <0.30*  --  0.32* 0.32* <0.30* 0.33*  LATICACIDVEN  --   --  1.15  --   --   --   --     Estimated Creatinine Clearance: 49.6 mL/min (A) (by C-G formula based on SCr of 0.33 mg/dL (L)).    Allergies  Allergen Reactions  . Valtrex [Valacyclovir Hcl] Cough    Cough and gagging    Antimicrobials this admission: 2/8 zosyn x1 2/8 vanco >> 2/8 cefepime  >>  Dose adjustments this admission:  Microbiology results: 2/4 MRSA PCR: positive 2/4 BCx x2: Neg FINAL  2/8 BCx x2: ngtd 2/8 flu panel: neg 2/8 strep Ag:neg 2/8 legionella Ag: pending  Thank you for allowing pharmacy to be a part of this patient's care.  Doreene Eland, PharmD, BCPS.   Pager: [phone] 02/15/2017 7:42 AM

## 2017-02-15 NOTE — Plan of Care (Signed)
  Progressing Education: Knowledge of General Education information will improve 02/15/2017 0847 - Progressing by Tiare Rohlman, Royetta Crochet, RN Health Behavior/Discharge Planning: Ability to manage health-related needs will improve 02/15/2017 0847 - Progressing by Breyon Sigg, Royetta Crochet, RN Clinical Measurements: Ability to maintain clinical measurements within normal limits will improve 02/15/2017 0847 - Progressing by Nakisha Chai, Royetta Crochet, RN Will remain free from infection 02/15/2017 0847 - Progressing by Syndi Pua, Royetta Crochet, RN Diagnostic test results will improve 02/15/2017 0847 - Progressing by Jerzie Bieri, Royetta Crochet, RN Respiratory complications will improve 02/15/2017 0847 - Progressing by Angeleen Horney, Royetta Crochet, RN Cardiovascular complication will be avoided 02/15/2017 0847 - Progressing by Colt Martelle, Royetta Crochet, RN Activity: Risk for activity intolerance will decrease 02/15/2017 0847 - Progressing by Roselina Burgueno, Royetta Crochet, RN Nutrition: Adequate nutrition will be maintained 02/15/2017 0847 - Progressing by Arushi Partridge, Royetta Crochet, RN Coping: Level of anxiety will decrease 02/15/2017 0847 - Progressing by Nyheim Seufert, Royetta Crochet, RN Elimination: Will not experience complications related to bowel motility 02/15/2017 0847 - Progressing by Aaryav Hopfensperger, Royetta Crochet, RN Will not experience complications related to urinary retention 02/15/2017 0847 - Progressing by Kyri Dai, Royetta Crochet, RN Pain Managment: General experience of comfort will improve 02/15/2017 0847 - Progressing by Arzu Mcgaughey, Royetta Crochet, RN Safety: Ability to remain free from injury will improve 02/15/2017 0847 - Progressing by Devaney Segers, Royetta Crochet, RN Skin Integrity: Risk for impaired skin integrity will decrease 02/15/2017 0847 - Progressing by Gloristine Turrubiates, Royetta Crochet, RN

## 2017-02-15 NOTE — Progress Notes (Signed)
ANTICOAGULATION CONSULT NOTE - Follow Up Consult  Pharmacy Consult for warfarin Indication: hx pulmonary embolus, DVT and stroke  Allergies  Allergen Reactions  . Valtrex [Valacyclovir Hcl] Cough    Cough and gagging    Patient Measurements: Height: 5' (152.4 cm) Weight: 169 lb 5 oz (76.8 kg) IBW/kg (Calculated) : 45.5  Vital Signs: Temp: 98.2 F (36.8 C) (02/11 0550) Temp Source: Oral (02/11 0550) BP: 142/90 (02/11 0550) Pulse Rate: 94 (02/11 0550)  Labs: Recent Labs    02/12/17 1531 02/13/17 0554 02/14/17 0505 02/15/17 0512  HGB 13.0  --  12.2 12.8  HCT 40.1  --  37.4 39.1  PLT 412*  --  460* 474*  LABPROT 30.4* 33.0* 31.7* 34.6*  INR 2.94 3.26 3.10 3.47  CREATININE 0.32* 0.32* <0.30* 0.33*  TROPONINI 0.10*  --   --   --     Estimated Creatinine Clearance: 49.6 mL/min (A) (by C-G formula based on SCr of 0.33 mg/dL (L)).   Medications:  PTA warfarin regimen: 2mg  daily except 4mg  on Mon/Fri (last dose 2/7 10PM)  Assessment: Patient's an 84 y.o F with hx essential thrombocytosis, VTE, and stroke on warfarin PTA, presented to the ED on 02/12/17 with c/o SOB and arm swelling.  To resume warfarin inpatient.  Today, 02/15/2017: - INR remains supra-therapeutic at 3.47, increased overnight despite no warfarin administration  - suspect increase warfarin sensitivity with decrease PO intake  - Note the INR became elevated after reduced dose of 2mg  given on 2/8 PM - CBC: Hgb remains WNL, Plts remain elevated - No bleeding documented - Drug-drug intxns: being on abx can make patient more sensitive to warfarin (currently on vancomycin and cefepime)  Goal of Therapy:  INR 2-3 Monitor platelets by anticoagulation protocol: Yes   Plan:  - Continue to hold warfarin today - F/u with INR in AM and redose if/when appropriate - monitor for s/s bleeding  Doreene Eland, PharmD, BCPS.   Pager: [phone] 02/15/2017 7:39 AM

## 2017-02-15 NOTE — Evaluation (Signed)
Clinical/Bedside Swallow Evaluation Patient Details  Name: Sandy Barrett MRN: [id number] Date of Birth: March 13, 1932  Today's Date: 02/15/2017 Time: SLP Start Time (ACUTE ONLY): 1455 SLP Stop Time (ACUTE ONLY): 1535 SLP Time Calculation (min) (ACUTE ONLY): 40 min  Past Medical History:  Past Medical History:  Diagnosis Date  . ARTHRITIS   . Arthritis   . Diverticulosis   . Essential thrombocytosis ([place])   . HYPERTENSION   . HYPERTHYROIDISM   . Hyperthyroidism    s/p I-131 ablation 03/2011 of multinod goiter  . INSOMNIA   . MIGRAINE HEADACHE   . OBESITY   . Posttraumatic stress disorder   . Pulmonary embolism ([place]) 03/2014   with DVT  . Stroke [facility]) 03/2014   dysarthria   Past Surgical History:  Past Surgical History:  Procedure Laterality Date  . ABDOMINAL HYSTERECTOMY  1976  . BREAST SURGERY     biopsy  . RADIOLOGY WITH ANESTHESIA Left 03/08/2014   Procedure: RADIOLOGY WITH ANESTHESIA;  Surgeon: Rob Hickman, MD;  Location: [facility];  Service: Radiology;  Laterality: Left;  . TONSILLECTOMY    . TOTAL HIP ARTHROPLASTY  1998    right   HPI:  Sandy Barrett a75 y.o.female,admitted with PNA. PMH significant for hypertension, CVAw right sided hemiplegia and aphasia, Essential thrombocytosis,PE, hyperthyroidism. CXR = patchy consolidation both lung bases.    Assessment / Plan / Recommendation Clinical Impression  New order for BSE received, due to increased difficulty reported with dys3 solids. Pt appears to tolerate dys2 consistencies (finely chopped) and thin liquids without overt s/s aspiration or oral difficulty. Daughter reports pt has intermittent difficulty at home with swallowing, but has been unable to identify problematic consistencies or pattern. She also indicates a plan for upcoming treatment to reduce the size of the goiter, and is hopeful that that will help with swallowing.   At this time, recommend advancing diet to dys 2 (finely chopped) with thin  liquids. Repeat MBS presented to daughter as an option, however, daughter asked appropriately "what would repeating that test change?" ST will continue to follow for diet tolerance assessment and education.    SLP Visit Diagnosis: Dysphagia, unspecified (R13.10)    Aspiration Risk  Mild aspiration risk;Moderate aspiration risk    Diet Recommendation Thin liquid;Dysphagia 2 (Fine chop)   Liquid Administration via: Cup;Straw Medication Administration: Crushed with puree Supervision: Full supervision/cueing for compensatory strategies;Patient able to self feed Compensations: Minimize environmental distractions;Slow rate;Small sips/bites;Follow solids with liquid Postural Changes: Seated upright at 90 degrees;Remain upright for at least 30 minutes after po intake(daughter requests pt be seated in chair for meals, if at all possible)    Other  Recommendations Oral Care Recommendations: Oral care BID   Follow up Recommendations None      Frequency and Duration min 1 x/week  1 week       Prognosis Prognosis for Safe Diet Advancement: Fair Barriers to Reach Goals: Time post onset;Language deficits      Swallow Study   General Date of Onset: 02/12/17 HPI: Sandy Barrett a84 y.o.female,admitted with PNA. PMH significant for hypertension, CVAw right sided hemiplegia and aphasia, Essential thrombocytosis,PE, hyperthyroidism. CXR = patchy consolidation both lung bases.  Type of Study: Bedside Swallow Evaluation Previous Swallow Assessment: MBS 04/21/16 revealed mild oral/pharyngeal dysphagia, suspected primary esophageal dysphagia. Scan of esophagus revealed stasis within the mid-esophagus and questionable stenosis. Dys 3, thin liquids, meds crushed in puree was recommended.  Barium Swallow 04/23/16 revealed large substernal goiter causing marked displacement of the esophagus to the right,  lack of primary peristalsis resulting in esophageal stasis.  Diet Prior to this Study: Dysphagia  1 (puree);Thin liquids Temperature Spikes Noted: No Respiratory Status: Nasal cannula History of Recent Intubation: No Behavior/Cognition: Alert;Cooperative;Pleasant mood Oral Cavity Assessment: Within Functional Limits Oral Care Completed by SLP: No Oral Cavity - Dentition: Dentures, top;Dentures, bottom Vision: Functional for self-feeding Self-Feeding Abilities: Able to feed self;Needs set up;Needs assist Patient Positioning: Upright in bed Baseline Vocal Quality: Normal Volitional Cough: Cognitively unable to elicit Volitional Swallow: Unable to elicit    Oral/Motor/Sensory Function Overall Oral Motor/Sensory Function: Mild impairment Facial ROM: Reduced right Facial Symmetry: Abnormal symmetry right   Ice Chips Ice chips: Not tested   Thin Liquid Thin Liquid: Impaired Presentation: Straw;Cup Oral Phase Impairments: Reduced labial seal Oral Phase Functional Implications: Right anterior spillage    Nectar Thick Nectar Thick Liquid: Not tested   Honey Thick Honey Thick Liquid: Not tested   Puree Puree: Within functional limits Presentation: Spoon   Solid   GO   Solid: Within functional limits Oral Phase Functional Implications: Oral residue       Sandy Barrett [facility], [place] Speech Language Pathologist [phone]  Sandy Barrett 02/15/2017,3:48 PM

## 2017-02-15 NOTE — Progress Notes (Signed)
PROGRESS NOTE  Sandy Barrett ZOX:[id number] DOB: [birth year] DOA: 02/12/2017 PCP: Sandy Rail, MD  HPI/Recap of past 24 hours:  Had choking episode on dysphagia diet this am  She is aphasic,  She has congested cough, she is too weak to cough up, no fever She is on 2 L of oxygen, baseline is not oxygen dependent Right upper extremity edema, Daughter at bedside request OT eval for lymphedema right arm  Assessment/Plan: Active Problems:   Pneumonia   HCAP (healthcare-associated pneumonia)   Sepsis (Sandy Barrett)   recurrent aspiration pneumonia/acute hypoxic respiratory failure -She was hypoxic for EMS with saturations on room air in the 70s.  --She was hospitalized from February 3 to December to 6 for pneumonia and cholecystitis.  -flu negative, blood culture no growth, sputum culture pending collection -Chest x-ray from admission personally reviewed, "Patchy consolidation both lung bases. A degree of bibasilar pneumonia must be of concern.  Small left pleural effusion" - she received IV cefepime and vancomycin since admission, clinically improving, mrsa screen negative, change abx to unasyn to cover aspiration pneumonia, continue mucinex,  wean oxygen  -repeat swallow eval on 2/11 due to choking episode of dysphagia 3 diet on 2/11 Diet modified per repeat swallow eval: Thin liquid;Dysphagia 2 (Fine chop)   Liquid Administration via: Cup;Straw Medication Administration: Crushed with puree Supervision: Full supervision/cueing for compensatory strategies;Patient able to self feed Compensations: Minimize environmental distractions;Slow rate;Small sips/bites;Follow solids with liquid Postural Changes: Seated upright at 90 degrees;Remain upright for at least 30 minutes after po intake(daughter requests pt be seated in chair for meals, if at all possible)    patient has multiple risks of aspiration, with h/o cva, bed to wheel chair bound, generalized weakness, aphasia, large goiter with  deviated trachea   -Daughter wants to try diet modification, behavioral maneuver, before considering feeding tube placement.  She also mentioned to follow with endocrinology for goiter treatment.  She is hopeful that her goiter will shrink and that will also help the patient swallow.  Hypokalemia/hypomagnesemia: remain low, continue to replace  elevated troponin: Mild 0.1 , no chest pain . probably as a result of pneumonia.  Appreciate cardiology input.  Echocardiogram reviewed with no significant abnormalities   history of PE: Continue warfarin per pharmacy. INR today is 3.47.  essential thrombocytosis: Patient on hydroxyurea.  Continue according to hematology Platelet 460   hypertension: Continue blood pressure control with  Propranolol, lasix  history of CVA: Resided right hemiplegia , aphasia, has been bed to wheelchair bound since 2016 .at baseline-requires hoyer lift for transfers.  She is able to feed herself.  She has 24/7 care at home.  Right arm edema, negative DVT from recent venous Doppler.  Been on Coumadin  likely dependent edema, elevate arm  hyperparathyroidism:  Marked thyroid enlargement with marked deviation of the thoracic trachea toward the right with thoracic tracheal narrowing, stable on x ray. Patient on Tapazole.  We'll continue treatment  GERD: Continue PPIs  Recently diagnosed with cholecystitis (hospitalized from February 3 to December to 6 ), felt not a candidate for surgery or gallbaldder drain.  Patient does not have n/v this hospitalization, denies abdominal pain, lft wnl.  Body mass index is 33.07 kg/m.   FTT, total care at home, will arrange home health RN/aids/OT   Code Status: Partial, yes to CPR and Defibrillation,  NO  intubation.   Family Communication: patient and daughter at bedside  Disposition Plan: home in 24-48hrs, wean oxygen   Consultants:  cardiology  Procedures:  none  Antibiotics:  Vanc/cefepime from  admission   Objective: BP (!) 148/93 (BP Location: Left Arm)   Pulse (!) 102   Temp 98.8 F (37.1 C) (Oral)   Resp 20   Ht 5' (1.524 m)   Wt 76.8 kg (169 lb 5 oz)   SpO2 96%   BMI 33.07 kg/m   Intake/Output Summary (Last 24 hours) at 02/15/2017 1438 Last data filed at 02/15/2017 1111 Gross per 24 hour  Intake 470 ml  Output 1700 ml  Net -1230 ml   Filed Weights   02/13/17 1600  Weight: 76.8 kg (169 lb 5 oz)    Exam: Patient is examined daily including today on 02/15/2017, exams remain the same as of yesterday except that has changed    General:  NAD, aphasia, right hemiplegia  Cardiovascular: RRR  Respiratory: overall diminished, less Rhonchi, less rales, no wheezing  Abdomen: Soft/ND/NT, positive BS  Musculoskeletal: right arm Edema  Neuro: alert, aphasia, right hemiplegia  Data Reviewed: Basic Metabolic Panel: Recent Labs  Lab 02/09/17 0440 02/10/17 0434 02/12/17 1531 02/13/17 0554 02/14/17 0505 02/15/17 0512  NA 142  --  143  --  144 146*  K 3.5  --  3.4*  --  2.8* 3.3*  CL 111  --  108  --  110 108  CO2 23  --  29  --  27 26  GLUCOSE 93  --  102*  --  92 98  BUN 9  --  9  --  8 7  CREATININE <0.30* <0.30* 0.32* 0.32* <0.30* 0.33*  CALCIUM 8.2*  --  8.4*  --  8.1* 8.3*  MG  --   --   --   --   --  1.6*   Liver Function Tests: Recent Labs  Lab 02/09/17 0440 02/12/17 1531  AST 39 18  ALT 122* 43  ALKPHOS 104 87  BILITOT 2.1* 0.9  PROT 5.0* 5.7*  ALBUMIN 2.6* 2.7*   No results for input(s): LIPASE, AMYLASE in the last 168 hours. No results for input(s): AMMONIA in the last 168 hours. CBC: Recent Labs  Lab 02/09/17 0529 02/12/17 1531 02/14/17 0505 02/15/17 0512  WBC 8.4 9.2 7.0 6.9  NEUTROABS  --  7.4 5.0 5.1  HGB 12.2 13.0 12.2 12.8  HCT 37.7 40.1 37.4 39.1  MCV 117.8* 116.6* 116.9* 118.5*  PLT 388 412* 460* 474*   Cardiac Enzymes:   Recent Labs  Lab 02/12/17 1531  TROPONINI 0.10*   BNP (last 3 results) Recent Labs     02/12/17 1531  BNP 899.5*    ProBNP (last 3 results) No results for input(s): PROBNP in the last 8760 hours.  CBG: Recent Labs  Lab 02/08/17 2228 02/09/17 0757 02/09/17 1557 02/09/17 2312 02/10/17 0805  GLUCAP 99 82 76 107* 89    Recent Results (from the past 240 hour(s))  Culture, blood (x 2)     Status: None   Collection Time: 02/08/17  6:31 AM  Result Value Ref Range Status   Specimen Description   Final    BLOOD LEFT ARM Performed at [facility] [address], [place], [place] [address]    Special Requests   Final    IN PEDIATRIC BOTTLE Blood Culture adequate volume Performed at [facility] [address], [place], [place] [address]    Culture   Final    NO GROWTH 5 DAYS Performed at [facility], [place],  [place] [address]    Report Status 02/13/2017 FINAL  Final  Culture, blood (x 2)     Status: None   Collection Time: 02/08/17  6:31 AM  Result Value Ref Range Status   Specimen Description   Final    BLOOD LEFT ARM Performed at [facility] [address], [place], [place] [address]    Special Requests   Final    IN PEDIATRIC BOTTLE Blood Culture adequate volume Performed at [facility] [address] [place], [place] [address]    Culture   Final    NO GROWTH 5 DAYS Performed at [facility], [place] [address], [place], [place] [address]    Report Status 02/13/2017 FINAL  Final  MRSA PCR Screening     Status: Abnormal   Collection Time: 02/08/17  3:41 PM  Result Value Ref Range Status   MRSA by PCR POSITIVE (A) NEGATIVE Final    Comment:        The GeneXpert MRSA Assay (FDA approved for NASAL specimens only), is one component of a comprehensive MRSA colonization surveillance program. It is not intended to diagnose MRSA infection nor to guide or monitor treatment for MRSA infections. RESULT CALLED TO, READ BACK BY AND VERIFIED WITHLoletha Grayer  PHILLIPS,RN [id number] @ 9024 BY J SCOTTON Performed at [facility], [place] [address], [place], [place] [address]   Culture, blood (routine x 2) Call MD if unable to obtain prior to antibiotics being given     Status: None (Preliminary result)   Collection Time: 02/12/17  8:27 PM  Result Value Ref Range Status   Specimen Description   Final    BLOOD LEFT HAND Performed at [facility] [address] [place], [place] [address]    Special Requests   Final    IN PEDIATRIC BOTTLE Blood Culture adequate volume Performed at [facility] [address] [place], [place] [address]    Culture   Final    NO GROWTH 2 DAYS Performed at [facility] [address] [place], [place] [address]    Report Status PENDING  Incomplete  Culture, blood (routine x 2) Call MD if unable to obtain prior to antibiotics being given     Status: None (Preliminary result)   Collection Time: 02/12/17  8:36 PM  Result Value Ref Range Status   Specimen Description   Final    BLOOD LEFT FOREARM Performed at [facility] [address], [place], [place] [address]    Special Requests   Final    IN PEDIATRIC BOTTLE Blood Culture adequate volume Performed at [facility] [address], [place], [place] [address]    Culture   Final    NO GROWTH 2 DAYS Performed at [facility] [address] [place],  [address]    Report Status PENDING  Incomplete     Studies: No results found.  Scheduled Meds: . acidophilus  1 capsule Oral Daily  . furosemide  40 mg Intravenous Daily  . guaiFENesin  5 mL Oral BID  . hydroxyurea  500 mg Oral Once per day on Mon Tue Wed Thu Fri   And  . hydroxyurea  1,000 mg Oral Once per day on Sun Sat  . methimazole  5 mg Oral Once per day on Sun Thu  . pantoprazole sodium  40 mg Oral Daily  . polyethylene glycol  17 g Oral  BID  . pregabalin  25 mg Oral BID  . propranolol   5 mg Oral BID  . Warfarin - Pharmacist Dosing Inpatient   Does not apply q1800    Continuous Infusions: . ampicillin-sulbactam (UNASYN) IV       Time spent: 35 mins  I have personally reviewed and interpreted on  02/15/2017 daily labs, tele strips, imagings as discussed above under date review session and assessment and plans.  I reviewed all nursing notes, pharmacy notes, consultant notes,  vitals, pertinent old records  I have discussed plan of care as described above with RN , patient and family on 02/15/2017   Florencia Reasons MD, PhD  [facility] Pager [phone]. If 7PM-7AM, please contact night-coverage at [email or web address], password [facility] 02/15/2017, 2:38 PM  LOS: 3 days

## 2017-02-16 DIAGNOSIS — J9621 Acute and chronic respiratory failure with hypoxia: Secondary | ICD-10-CM

## 2017-02-16 DIAGNOSIS — E876 Hypokalemia: Secondary | ICD-10-CM

## 2017-02-16 DIAGNOSIS — J690 Pneumonitis due to inhalation of food and vomit: Principal | ICD-10-CM

## 2017-02-16 DIAGNOSIS — R4701 Aphasia: Secondary | ICD-10-CM

## 2017-02-16 DIAGNOSIS — G8191 Hemiplegia, unspecified affecting right dominant side: Secondary | ICD-10-CM

## 2017-02-16 LAB — CBC WITH DIFFERENTIAL/PLATELET
Basophils Absolute: 0 10*3/uL (ref 0.0–0.1)
Basophils Relative: 0 %
Eosinophils Absolute: 0.1 10*3/uL (ref 0.0–0.7)
Eosinophils Relative: 1 %
HCT: 35.5 % — ABNORMAL LOW (ref 36.0–46.0)
Hemoglobin: 11.7 g/dL — ABNORMAL LOW (ref 12.0–15.0)
Lymphocytes Relative: 13 %
Lymphs Abs: 0.9 10*3/uL (ref 0.7–4.0)
MCH: 39.1 pg — ABNORMAL HIGH (ref 26.0–34.0)
MCHC: 33 g/dL (ref 30.0–36.0)
MCV: 118.7 fL — ABNORMAL HIGH (ref 78.0–100.0)
Monocytes Absolute: 0.6 10*3/uL (ref 0.1–1.0)
Monocytes Relative: 9 %
Neutro Abs: 5.1 10*3/uL (ref 1.7–7.7)
Neutrophils Relative %: 77 %
Platelets: 455 10*3/uL — ABNORMAL HIGH (ref 150–400)
RBC: 2.99 MIL/uL — ABNORMAL LOW (ref 3.87–5.11)
RDW: 15.1 % (ref 11.5–15.5)
WBC: 6.7 10*3/uL (ref 4.0–10.5)

## 2017-02-16 LAB — MAGNESIUM: Magnesium: 2.1 mg/dL (ref 1.7–2.4)

## 2017-02-16 LAB — BASIC METABOLIC PANEL
Anion gap: 10 (ref 5–15)
BUN: 9 mg/dL (ref 6–20)
CO2: 30 mmol/L (ref 22–32)
Calcium: 8.4 mg/dL — ABNORMAL LOW (ref 8.9–10.3)
Chloride: 105 mmol/L (ref 101–111)
Creatinine, Ser: <0.3 mg/dL — ABNORMAL LOW (ref 0.44–1.00)
Glucose, Bld: 96 mg/dL (ref 65–99)
Potassium: 3.6 mmol/L (ref 3.5–5.1)
Sodium: 145 mmol/L (ref 135–145)

## 2017-02-16 LAB — PROTIME-INR
INR: 2.63
Prothrombin Time: 27.9 seconds — ABNORMAL HIGH (ref 11.4–15.2)

## 2017-02-16 MED ORDER — WARFARIN SODIUM 2 MG PO TABS
2.0000 mg | ORAL_TABLET | Freq: Once | ORAL | Status: AC
  Administered 2017-02-16: 2 mg via ORAL
  Filled 2017-02-16: qty 1

## 2017-02-16 MED ORDER — POTASSIUM CHLORIDE 20 MEQ/15ML (10%) PO SOLN
40.0000 meq | Freq: Once | ORAL | Status: AC
  Administered 2017-02-16: 40 meq via ORAL
  Filled 2017-02-16: qty 30

## 2017-02-16 NOTE — Plan of Care (Signed)
  Elimination: Will not experience complications related to bowel motility 02/16/2017 1208 - Progressing by Dorene Sorrow, RN   Pain Managment: General experience of comfort will improve 02/16/2017 1208 - Progressing by Dorene Sorrow, RN   Safety: Ability to remain free from injury will improve 02/16/2017 1208 - Progressing by Dorene Sorrow, RN

## 2017-02-16 NOTE — Care Management Important Message (Signed)
Important Message  Patient Details IM Letter given to Kathy/Case Manager to present to the Patient Name: Sandy Barrett MRN: [id number] Date of Birth: [birth year]   Medicare Important Message Given:  Yes    Kerin Salen 02/16/2017, 10:57 AM

## 2017-02-16 NOTE — Progress Notes (Signed)
Occupational Therapy Treatment Patient Details Name: Sandy Barrett MRN: [id number] DOB: 1932-03-05 Today's Date: 02/16/2017    History of present illness Sandy Barrett  is a 84 y.o. female, w hypertension, CVA w right sided hemiplegia and aphasia, Essential thrombocytosis, PE,  hyperthyroidism, c/o  Congestion and subjective fever and dyspnea    OT comments  Pt with decreased edema RUE this day  Follow Up Recommendations  Home health OT - pt had an OT in past with [facility] that had lymphedema training that they would like to see again.  Her Name was Sandy Barrett.  Shared with CM   Equipment Recommendations  None recommended by OT    Recommendations for Other Services      Precautions / Restrictions Precautions Precaution Comments: L side hemiplegia and LUE edema       Mobility Bed Mobility        dependent          Transfers            dependent              ADL either performed or assessed with clinical judgement   ADL Overall ADL's : Needs assistance/impaired                                       General ADL Comments: Pt with decreased edema LUE. Pts Sandy Barrett help LUE elevated entire night.  Sandy Barrett able to perform RUE PROM and positioning.  Reccomend HHOT to address RUE positioning in the home                Cognition Arousal/Alertness: Awake/alert Behavior During Therapy: WFL for tasks assessed/performed Overall Cognitive Status: History of cognitive impairments - at baseline                                                General Comments      Pertinent Vitals/ Pain       Pain Score: 4  Pain Location: grimace with RUE PROM Pain Descriptors / Indicators: Grimacing Pain Intervention(s): Limited activity within patient's tolerance;Monitored during session         Frequency  Min 2X/week        Progress Toward Goals  OT Goals(current goals can now be found in the care plan section)     ADL  Goals Pt/caregiver will Perform Home Exercise Program: Right Upper extremity;With written HEP provided Additional ADL Goal #1: Sandy Barrett will keep RUE elevated to decrease edema in sitting and supine position  Plan Discharge plan remains appropriate       AM-PAC PT "6 Clicks" Daily Activity     Outcome Measure   Help from another person eating meals?: A Lot Help from another person taking care of personal grooming?: Total Help from another person toileting, which includes using toliet, bedpan, or urinal?: Total Help from another person bathing (including washing, rinsing, drying)?: Total Help from another person to put on and taking off regular upper body clothing?: Total Help from another person to put on and taking off regular lower body clothing?: Total 6 Click Score: 7    End of Session    OT Visit Diagnosis: Hemiplegia and hemiparesis Hemiplegia - Right/Left: Right Hemiplegia - dominant/non-dominant: Dominant Hemiplegia - caused by: Cerebral infarction  Activity Tolerance Patient tolerated treatment well   Patient Left in bed;with call bell/phone within reach;with family/visitor present   Nurse Communication Need for lift equipment        Time: 1215-1232 OT Time Calculation (min): 17 min  Charges: OT General Charges $OT Visit: 1 Visit OT Treatments $Therapeutic Exercise: 8-22 mins  Sandy Barrett, Sandy Barrett   Sandy Barrett 02/16/2017, 1:42 PM

## 2017-02-16 NOTE — Progress Notes (Signed)
PROGRESS NOTE  Sandy Barrett HUD:[id number] DOB: [birth year] DOA: 02/12/2017 PCP: [name], MD   Brief Summary:   History of stroke with right hemiplegia, aphasia, bed to wheelchair bound at baseline,  h/o PE,   She was recently hospitalized from 2/3 to 2/6 for pneumonia and cholecystitis.  she developed fever at home and was brought back to the hospital on 2/8.   presented with fever, cough, dyspnea, hypoxia, recurrent  aspiration pna Improving, wean oxygen, likely need home 02 , d/c on 2/13 to home with home health    HPI/Recap of past 24 hours:  Hypoxia 85% on room air this am, she was put back on oxygen  She is aphasic,  She has congested cough, she is too weak to cough up, no fever   Assessment/Plan: Active Problems:   Pneumonia   HCAP (healthcare-associated pneumonia)   Sepsis ([place])  Recurrent aspiration pneumonia/acute hypoxic respiratory failure (suspect underline chronic hypoxia) -She was hypoxic  with saturations on room air in the 70s per EMS reports on presentation.   -flu negative, blood culture no growth, sputum culture pending collection -Chest x-ray from admission personally reviewed, "Patchy consolidation both lung bases. A degree of bibasilar pneumonia must be of concern.  Small left pleural effusion" - she received IV cefepime and vancomycin since admission, clinically improving, mrsa screen negative, change abx to unasyn to cover aspiration pneumonia, continue mucinex,  wean oxygen -plan to transition to augmentin tomorrow.   -repeat swallow eval on 2/11 due to choking episode of dysphagia 3 diet on 2/11 Diet modified per repeat swallow eval: Thin liquid;Dysphagia 2 (Fine chop)   Liquid Administration via: Cup;Straw Medication Administration: Crushed with puree Supervision: Full supervision/cueing for compensatory strategies;Patient able to self feed Compensations: Minimize environmental distractions;Slow rate;Small sips/bites;Follow solids  with liquid Postural Changes: Seated upright at 90 degrees;Remain upright for at least 30 minutes after po intake(daughter requests pt be seated in chair for meals, if at all possible)    patient has multiple risks of aspiration, with h/o cva, bed to wheel chair bound, generalized weakness, aphasia, large goiter with deviated trachea   -Daughter wants to try diet modification, behavioral maneuver, before considering feeding tube placement.  She also mentioned to follow with endocrinology for goiter treatment.  She is hopeful that her goiter will shrink and that will also help the patient swallow.  Hypokalemia/hypomagnesemia:  Mag normalized k remain low, continue to replace Repeat lab in am  elevated troponin: Mild 0.1 , no chest pain . probably as a result of pneumonia.   Echocardiogram reviewed with no significant abnormalities  cardiology initially consulted, signed off.   Tele personally reviewed, unremarkable, will d/c tele today.   History of PE: Continue warfarin per pharmacy. INR today is 2.6  Essential thrombocytosis: Patient on hydroxyurea.  Continue according to hematology Platelet 455.   Hypertension: Continue blood pressure control with  Propranolol, lasix  history of CVA: Resided right hemiplegia , aphasia, has been bed to wheelchair bound since 2016 .at baseline-requires hoyer lift for transfers.  She is able to feed herself.  She has 24/7 care at home.  Right arm edema, chronic /intermittent per daughter  negative DVT from recent venous Doppler.  Been on Coumadin  likely dependent edema, elevate arm lymphodema treatment on outpatient basis,   hyperparathyroidism:  Marked thyroid enlargement with marked deviation of the thoracic trachea toward the right with thoracic tracheal narrowing, stable on x ray. Patient on Tapazole.  We'll continue treatment  GERD: Continue PPIs  Recently diagnosed with cholecystitis (hospitalized from 2/3-2/ 6 ), felt not a  candidate for surgery or gallbaldder drain.  Patient does not have n/v this hospitalization, denies abdominal pain, lft wnl.  Body mass index is 33.07 kg/m.   FTT, total care at home, will arrange home health RN/aids/OT, likely will need home 02.   Code Status: Partial, yes to CPR and Defibrillation,  NO  intubation.   Family Communication: patient and daughter at bedside  Disposition Plan: home , hopefully on 2/13, wean oxygen   Consultants:  Cardiology on 2/9  Procedures:  none  Antibiotics:  Vanc/cefepime from admission to 2/11  unsyn from 2/11   Objective: BP 117/75 (BP Location: Left Arm)   Pulse 93   Temp 97.6 F (36.4 C) (Oral)   Resp 18   Ht 5' (1.524 m)   Wt 76.8 kg (169 lb 5 oz)   SpO2 93%   BMI 33.07 kg/m   Intake/Output Summary (Last 24 hours) at 02/16/2017 1651 Last data filed at 02/16/2017 1135 Gross per 24 hour  Intake 420 ml  Output 1200 ml  Net -780 ml   Filed Weights   02/13/17 1600  Weight: 76.8 kg (169 lb 5 oz)    Exam: Patient is examined daily including today on 02/16/2017, exams remain the same as of yesterday except that has changed    General:  NAD, aphasia, right hemiplegia  Cardiovascular: RRR  Respiratory: overall diminished, less Rhonchi, less rales, no wheezing  Abdomen: Soft/ND/NT, positive BS  Musculoskeletal: right arm Edema is improving  Neuro: alert, aphasia, right hemiplegia  Data Reviewed: Basic Metabolic Panel: Recent Labs  Lab 02/12/17 1531 02/13/17 0554 02/14/17 0505 02/15/17 0512 02/16/17 0515  NA 143  --  144 146* 145  K 3.4*  --  2.8* 3.3* 3.6  CL 108  --  110 108 105  CO2 29  --  27 26 30   GLUCOSE 102*  --  92 98 96  BUN 9  --  8 7 9   CREATININE 0.32* 0.32* <0.30* 0.33* <0.30*  CALCIUM 8.4*  --  8.1* 8.3* 8.4*  MG  --   --   --  1.6* 2.1   Liver Function Tests: Recent Labs  Lab 02/12/17 1531  AST 18  ALT 43  ALKPHOS 87  BILITOT 0.9  PROT 5.7*  ALBUMIN 2.7*   No results for  input(s): LIPASE, AMYLASE in the last 168 hours. No results for input(s): AMMONIA in the last 168 hours. CBC: Recent Labs  Lab 02/12/17 1531 02/14/17 0505 02/15/17 0512 02/16/17 0515  WBC 9.2 7.0 6.9 6.7  NEUTROABS 7.4 5.0 5.1 5.1  HGB 13.0 12.2 12.8 11.7*  HCT 40.1 37.4 39.1 35.5*  MCV 116.6* 116.9* 118.5* 118.7*  PLT 412* 460* 474* 455*   Cardiac Enzymes:   Recent Labs  Lab 02/12/17 1531  TROPONINI 0.10*   BNP (last 3 results) Recent Labs    02/12/17 1531  BNP 899.5*    ProBNP (last 3 results) No results for input(s): PROBNP in the last 8760 hours.  CBG: Recent Labs  Lab 02/09/17 2312 02/10/17 0805  GLUCAP 107* 89    Recent Results (from the past 240 hour(s))  Culture, blood (x 2)     Status: None   Collection Time: 02/08/17  6:31 AM  Result Value Ref Range Status   Specimen Description   Final    BLOOD LEFT ARM Performed at [facility] [address], [place],  [address]  Special Requests   Final    IN PEDIATRIC BOTTLE Blood Culture adequate volume Performed at [facility] [address], [place], [place] [address]    Culture   Final    NO GROWTH 5 DAYS Performed at [facility], [place] [address] [place], [place] [address]    Report Status 02/13/2017 FINAL  Final  Culture, blood (x 2)     Status: None   Collection Time: 02/08/17  6:31 AM  Result Value Ref Range Status   Specimen Description   Final    BLOOD LEFT ARM Performed at [facility] [address], [place], [place] [address]    Special Requests   Final    IN PEDIATRIC BOTTLE Blood Culture adequate volume Performed at [facility] [address], [place], [place] [address]    Culture   Final    NO GROWTH 5 DAYS Performed at [facility], [place] [address] [place], [place] [address]    Report Status 02/13/2017 FINAL  Final  MRSA PCR Screening     Status: Abnormal   Collection Time:  02/08/17  3:41 PM  Result Value Ref Range Status   MRSA by PCR POSITIVE (A) NEGATIVE Final    Comment:        The GeneXpert MRSA Assay (FDA approved for NASAL specimens only), is one component of a comprehensive MRSA colonization surveillance program. It is not intended to diagnose MRSA infection nor to guide or monitor treatment for MRSA infections. RESULT CALLED TO, READ BACK BY AND VERIFIED WITHLoletha Grayer PHILLIPS,RN [id number] @ 2585 BY J SCOTTON Performed at [facility], [place] [address], [place], [place] [address]   Culture, blood (routine x 2) Call MD if unable to obtain prior to antibiotics being given     Status: None (Preliminary result)   Collection Time: 02/12/17  8:27 PM  Result Value Ref Range Status   Specimen Description   Final    BLOOD LEFT HAND Performed at [facility] [address], [place], [place] [address]    Special Requests   Final    IN PEDIATRIC BOTTLE Blood Culture adequate volume Performed at [facility] [address] [place], [place] [address]    Culture   Final    NO GROWTH 3 DAYS Performed at [facility], [place] [address], [place], [place] [address]    Report Status PENDING  Incomplete  Culture, blood (routine x 2) Call MD if unable to obtain prior to antibiotics being given     Status: None (Preliminary result)   Collection Time: 02/12/17  8:36 PM  Result Value Ref Range Status   Specimen Description   Final    BLOOD LEFT FOREARM Performed at [facility] [address] [place], [place] [address]    Special Requests   Final    IN PEDIATRIC BOTTLE Blood Culture adequate volume Performed at [facility] [address], [place], [place] [address]    Culture   Final    NO GROWTH 3 DAYS Performed at [facility], [place] [address] [place], [place] [address]    Report Status PENDING  Incomplete     Studies: No results found.  Scheduled  Meds: . acidophilus  1 capsule Oral Daily  . furosemide  40 mg Intravenous Daily  . guaiFENesin  5 mL Oral BID  . hydroxyurea  500 mg Oral Once  per day on Mon Tue Wed Thu Fri   And  . hydroxyurea  1,000 mg Oral Once per day on Sun Sat  . methimazole  5 mg Oral Once per day on Sun Thu  . pantoprazole sodium  40 mg Oral Daily  . polyethylene glycol  17 g Oral BID  . pregabalin  25 mg Oral BID  . propranolol  5 mg Oral BID  . warfarin  2 mg Oral ONCE-1800  . Warfarin - Pharmacist Dosing Inpatient   Does not apply q1800    Continuous Infusions: . ampicillin-sulbactam (UNASYN) IV Stopped (02/16/17 1135)     Time spent: 25 mins  I have personally reviewed and interpreted on  02/16/2017 daily labs, tele strips, imagings as discussed above under date review session and assessment and plans.  I reviewed all nursing notes, pharmacy notes, consultant notes,  vitals, pertinent old records  I have discussed plan of care as described above with RN , patient and family on 02/16/2017   Florencia Reasons MD, PhD  [facility] Pager [phone]. If 7PM-7AM, please contact night-coverage at [email or web address], password [facility] 02/16/2017, 4:51 PM  LOS: 4 days

## 2017-02-16 NOTE — Progress Notes (Signed)
ANTICOAGULATION CONSULT NOTE - Follow Up Consult  Pharmacy Consult for warfarin Indication: hx pulmonary embolus, DVT and stroke  Allergies  Allergen Reactions  . Valtrex [Valacyclovir Hcl] Cough    Cough and gagging   Patient Measurements: Height: 5' (152.4 cm) Weight: 169 lb 5 oz (76.8 kg) IBW/kg (Calculated) : 45.5  Vital Signs: Temp: 98.3 F (36.8 C) (02/12 0614) Temp Source: Oral (02/12 0614) BP: 143/72 (02/12 0614) Pulse Rate: 87 (02/12 0614)  Labs: Recent Labs    02/14/17 0505 02/15/17 0512 02/16/17 0515  HGB 12.2 12.8 11.7*  HCT 37.4 39.1 35.5*  PLT 460* 474* 455*  LABPROT 31.7* 34.6* 27.9*  INR 3.10 3.47 2.63  CREATININE <0.30* 0.33* <0.30*   CrCl cannot be calculated (This lab value cannot be used to calculate CrCl because it is not a number: <0.30).  Medications:  PTA warfarin regimen: 2mg  daily except 4mg  on Mon/Fri (last dose 2/7 10PM)  Assessment: Patient's an 84 y.o F with hx essential thrombocytosis, VTE, and stroke on warfarin PTA, presented to the ED on 02/12/17 with c/o SOB and arm swelling.  To resume warfarin inpatient.  Today, 02/16/2017: - INR has been supra-therapeutic, now within therapeutic range at 2.63  - suspect increase warfarin sensitivity due to decreased PO intake  - Note the INR became elevated after reduced dose of 2mg  given on 2/8 PM - CBC: Hgb slightly below NL, Plts remain elevated - No bleeding documented - Drug-drug intxns: abx administration can increase sensitivity to warfarin (previous vancomycin and cefepime, current Unasyn)  Goal of Therapy:  INR 2-3 Monitor platelets by anticoagulation protocol: Yes   Plan:   Warfarin 2mg  today at 1800  Daily Protime/INR  Monitor for s/s of bleed, po intake  Minda Ditto PharmD Pager [phone] 02/16/2017, 11:46 AM

## 2017-02-16 NOTE — Progress Notes (Signed)
SATURATION QUALIFICATIONS: (This note is used to comply with regulatory documentation for home oxygen)  Patient Saturations on Room Air at Rest = 85%  Patient Saturations on Room Air while Ambulating = %  Patient Saturations on 1 Liters of oxygen while Resting = 94%  Please briefly explain why patient needs home oxygen:

## 2017-02-16 NOTE — Telephone Encounter (Signed)
Unable to contact daughter

## 2017-02-16 NOTE — Care Management Note (Signed)
Case Management Note  Patient Details  Name: Sandy Barrett MRN: [id number] Date of Birth: 1932/04/12  Subjective/Objective: Active w/[facility] @ home-[facility] rep aware HHRN/PT/OT/aide/ST,csw ordered. DME ordered-yanker-suction supplies,02-[facility] rep Santiago Glad will provide dme-to be delivered to home prior d/c home. [facility] ambulance transp @ d/c.                   Action/Plan:d/c plan home w/HHC/DME/[facility]   Expected Discharge Date:                  Expected Discharge Plan:  [place]  In-House Referral:  NA  Discharge planning Services  CM Consult  Post Acute Care Choice:  Home Health Choice offered to:  Patient, Adult Children  DME Arranged:  Oxygen, Suction DME Agency:  [facility]:  RN, PT, Nurse's Aide, OT, Social Work, Theme park manager Therapy HH Agency:  [facility] [employer]  Status of Service:  In process, will continue to follow  If discussed at Long Length of Stay Meetings, dates discussed:    Additional Comments:  Dessa Phi, RN 02/16/2017, 12:07 PM

## 2017-02-17 LAB — CBC WITH DIFFERENTIAL/PLATELET
Basophils Absolute: 0 10*3/uL (ref 0.0–0.1)
Basophils Relative: 0 %
Eosinophils Absolute: 0.1 10*3/uL (ref 0.0–0.7)
Eosinophils Relative: 1 %
HCT: 39.6 % (ref 36.0–46.0)
Hemoglobin: 12.8 g/dL (ref 12.0–15.0)
Lymphocytes Relative: 15 %
Lymphs Abs: 1.2 10*3/uL (ref 0.7–4.0)
MCH: 38.7 pg — ABNORMAL HIGH (ref 26.0–34.0)
MCHC: 32.3 g/dL (ref 30.0–36.0)
MCV: 119.6 fL — ABNORMAL HIGH (ref 78.0–100.0)
Monocytes Absolute: 0.8 10*3/uL (ref 0.1–1.0)
Monocytes Relative: 10 %
Neutro Abs: 6 10*3/uL (ref 1.7–7.7)
Neutrophils Relative %: 74 %
Platelets: 489 10*3/uL — ABNORMAL HIGH (ref 150–400)
RBC: 3.31 MIL/uL — ABNORMAL LOW (ref 3.87–5.11)
RDW: 15.1 % (ref 11.5–15.5)
WBC: 8.1 10*3/uL (ref 4.0–10.5)

## 2017-02-17 LAB — BASIC METABOLIC PANEL
Anion gap: 11 (ref 5–15)
BUN: 10 mg/dL (ref 6–20)
CO2: 30 mmol/L (ref 22–32)
Calcium: 8.8 mg/dL — ABNORMAL LOW (ref 8.9–10.3)
Chloride: 104 mmol/L (ref 101–111)
Creatinine, Ser: 0.31 mg/dL — ABNORMAL LOW (ref 0.44–1.00)
GFR calc Af Amer: >60 mL/min (ref >60)
GFR calc non Af Amer: >60 mL/min (ref >60)
Glucose, Bld: 97 mg/dL (ref 65–99)
Potassium: 4 mmol/L (ref 3.5–5.1)
Sodium: 145 mmol/L (ref 135–145)

## 2017-02-17 LAB — PROTIME-INR
INR: 2.05
Prothrombin Time: 22.9 seconds — ABNORMAL HIGH (ref 11.4–15.2)

## 2017-02-17 LAB — MAGNESIUM: Magnesium: 2.1 mg/dL (ref 1.7–2.4)

## 2017-02-17 MED ORDER — WARFARIN SODIUM 4 MG PO TABS
4.0000 mg | ORAL_TABLET | Freq: Once | ORAL | Status: AC
  Administered 2017-02-17: 4 mg via ORAL
  Filled 2017-02-17: qty 1

## 2017-02-17 MED ORDER — GUAIFENESIN 100 MG/5ML PO SOLN: 5.0000 mL | Freq: Two times a day (BID) | ORAL | 0 refills | Status: DC

## 2017-02-17 NOTE — Progress Notes (Signed)
DME oxygen documentation ordered. Pt unable to ambulate so unable to obtain O2sats while ambulating with and without oxygen.

## 2017-02-17 NOTE — Progress Notes (Signed)
    Durable Medical Equipment  (From admission, onward)        Start     Ordered   02/17/17 0952  DME Oxygen  Once    Question Answer Comment  Mode or (Route) Nasal cannula   Liters per Minute 2   Frequency Continuous (stationary and portable oxygen unit needed)   Oxygen conserving device Yes   Oxygen delivery system Gas      02/17/17 0951   02/16/17 0955  For home use only DME Other see comment  Once    Comments:  Yankeer,suction supplies   02/16/17 0955

## 2017-02-17 NOTE — Progress Notes (Signed)
ANTICOAGULATION CONSULT NOTE - Follow Up Consult  Pharmacy Consult for warfarin Indication: hx pulmonary embolus, DVT and stroke  Allergies  Allergen Reactions  . Valtrex [Valacyclovir Hcl] Cough    Cough and gagging   Patient Measurements: Height: 5' (152.4 cm) Weight: 169 lb 5 oz (76.8 kg) IBW/kg (Calculated) : 45.5  Vital Signs: Temp: 98.1 F (36.7 C) (02/13 0634) Temp Source: Oral (02/13 0634) BP: 126/75 (02/13 0634) Pulse Rate: 95 (02/13 0634)  Labs: Recent Labs    02/15/17 0512 02/16/17 0515 02/17/17 0450  HGB 12.8 11.7* 12.8  HCT 39.1 35.5* 39.6  PLT 474* 455* 489*  LABPROT 34.6* 27.9* 22.9*  INR 3.47 2.63 2.05  CREATININE 0.33* <0.30* 0.31*   Estimated Creatinine Clearance: 49.6 mL/min (A) (by C-G formula based on SCr of 0.31 mg/dL (L)).  Medications:  PTA warfarin regimen: 2mg  daily except 4mg  on Mon/Fri (last dose 2/7 PTA)  Assessment: Patient's an 84 y.o F with hx essential thrombocytosis, VTE, and stroke on warfarin PTA, presented to the ED on 02/12/17 with c/o SOB and arm swelling.  To resume warfarin inpatient.  Today, 02/17/2017: - INR remains in therapeutic range - 2,05, Warfarin resumed 2/12 - Warfarin held 2/8-2/11 for supratherapeutic levels  - CBC: Hgb WNL, Plts remain elevated, increasing - No bleeding documented - Drug-drug intxns: abx administration can increase sensitivity to warfarin (previous vancomycin and cefepime, current Unasyn)  Goal of Therapy:  INR 2-3 Monitor platelets by anticoagulation protocol: Yes   Plan:   Warfarin 4mg  today at 1000  Would discharge on home dose  Daily Protime/INR  Monitor for s/s of bleed, po intake  Minda Ditto PharmD Pager [phone] 02/17/2017, 7:29 AM

## 2017-02-17 NOTE — Progress Notes (Signed)
Explained to dtr that dme must arrive in the home prior patient coming home-dtr will make plans for her transporation to home for arrival of dme-[facility] rep karen will coordinate delivery of dme w/dtr once medicare notice of home 02 not being covered is signed. MD will talk to dtr.

## 2017-02-17 NOTE — Discharge Summary (Addendum)
Discharge Summary  Sandy Barrett HCW:[id number] DOB: 1932-05-05  PCP: Sandy Rail, MD  Admit date: 02/12/2017 Discharge date: 02/17/2017  Time spent: >66mns, more than 50% time spent on coordination of care.  Recommendations for Outpatient Follow-up:  1. F/u with PMD within a week  for hospital discharge follow up, repeat cbc/bmp at follow up. pmd to refer patient to have sleep study, repeat cxr in 3-4 weeks to follow up on aspiration pneumonia 2. Check INR on Friday, follow up with coumadin clinic  Discharge Diagnoses:  Active Hospital Problems   Diagnosis Date Noted  . Sepsis ([place] 02/13/2017  . Pneumonia 02/12/2017  . HCAP (healthcare-associated pneumonia) 02/12/2017    Resolved Hospital Problems  No resolved problems to display.    Discharge Condition: stable  Diet recommendation: heart healthy  Thin liquid;Dysphagia 2 (Fine chop)   Liquid Administration via: Cup;Straw Medication Administration: Crushed with puree Supervision: Full supervision/cueing for compensatory strategies;Patient able to self feed Compensations: Minimize environmental distractions;Slow rate;Small sips/bites;Follow solids with liquid Postural Changes: Seated upright at 90 degrees;Remain upright for at least 30 minutes after po intake(daughter requests pt be seated in chair for meals, if at all possible)   Filed Weights   02/13/17 1600  Weight: 76.8 kg (169 lb 5 oz)    History of present illness: (per admitting MD Dr KMaudie Mercury Sandy Barrett is a 84y.o. 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 Pt states fever started last nite and has dry cough  then had more difficulty with congestion, and dyspnea today and therefore presented to ED.    In ED,  CXR IMPRESSION: 1. Marked thyroid enlargement with marked deviation of the thoracic trachea toward the right with thoracic  tracheal narrowing, stable.  2. Patchy consolidation both lung bases. A degree of bibasilar pneumonia must be of concern.  3. Small left pleural effusion.  4. Stable cardiac prominence.  5. Small granulomas left upper lobe.  6. Aortic atherosclerosis.  7. Bones osteoporotic.  La 1.15 Wbc 9.2, Hgb 13.0, Plt 412 mcv 116.6 Na 143, K 3.4 Bun 9, Creatinine 0.32 BNP 899.5 Trop 0.10 INR 2.94  Alb 2.7 Ast 18, Alt 43 Alk phos 87, T. Bili 0.9  Influenza negative  Pt will be admitted for pneumonia, Hcap, troponin elevation, and severe protein calorie malnutrition.       Hospital Course:  Active Problems:   Pneumonia   HCAP (healthcare-associated pneumonia)   Sepsis ([place]   Recurrent aspiration pneumonia/acute hypoxic respiratory failure (suspect underline chronic hypoxia) -She was hypoxic  with saturations on room air in the 70s per EMS reports on presentation.   -flu negative, blood culture no growth, cough is not productive -Chest x-ray from admission personally reviewed, "Patchy consolidation both lung bases. A degree of bibasilar pneumonia must be of concern. Small left pleural effusion" - she received IV cefepime and vancomycin since admission, clinically improving, mrsa screen negative, change abx to unasyn to cover aspiration pneumonia, continue mucinex,  -discharge on augmentin.   -repeat swallow eval on 2/11 due to choking episode of dysphagia 3 diet on 2/11 Diet modified per repeat swallow eval: Thin liquid;Dysphagia 2 (Fine chop)  Liquid Administration via: Cup;Straw Medication Administration: Crushed with puree Supervision: Full supervision/cueing for compensatory strategies;Patient able to self feed Compensations: Minimize environmental distractions;Slow rate;Small sips/bites;Follow solids with liquid Postural Changes: Seated upright at 90 degrees;Remain upright for at least 30 minutes after po intake(daughter  requests pt be seated in  chair for meals, if at all possible)   patient has multiple risks of aspiration, with h/o cva, bed to wheel chair bound, generalized weakness, aphasia, large goiter with deviated trachea   -Daughter wants to try diet modification, behavioral maneuver, before considering feeding tube placement.  She also mentioned to follow with endocrinology for goiter treatment.  She is hopeful that her goiter will shrink and that will also help the patient swallow.  Small left pleural effusion: She received iv lasix in the hospital. pmd to follow up.  Hypokalemia/hypomagnesemia:  K/ Mag normalized  elevated troponin: Mild 0.1 , no chest pain . probably as a result of pneumonia.  Echocardiogram reviewed with no significant abnormalities  cardiology initially consulted, signed off.   Tele personally reviewed, unremarkable, will d/c tele today.  History of PE: Continue warfarin per pharmacy. INR today is 2.05  Essential thrombocytosis: Patient on hydroxyurea. Continue according to hematology Platelet 455.   Hypertension: Continue blood pressure control with Propranolol, lasix  history of CVA: Resided right hemiplegia , aphasia, has been bed to wheelchair bound since 2016 .at baseline-requires hoyer lift for transfers.  She is able to feed herself.  She has 24/7 care at home.  Right arm edema, chronic /intermittent per daughter  negative DVT from recent venous Doppler.  Been on Coumadin  likely dependent edema, elevate arm lymphodema treatment on outpatient basis,   hyperparathyroidism:  Marked thyroid enlargement with marked deviation of the thoracic trachea toward the right with thoracic tracheal narrowing, stable on x ray. Patient on Tapazole. We'll continue treatment  GERD: Continue PPIs  Recently diagnosed with cholecystitis (hospitalized from 2/3-2/ 6 ), felt not a candidate for surgery or gallbaldder drain.  Patient does not have n/v this hospitalization, denies  abdominal pain, lft wnl.  Body mass index is 33.07 kg/m.   FTT, total care at home, arrange home health RN/aids/OT, home 02.  Body mass index is 33.07 kg/m.  Code Status: Partial, yes to CPR and Defibrillation NO  intubation.   Family Communication: patient and daughter at bedside  Disposition Plan: home with home health on 2/13, recommend home 02 due to patient likely has acute on chronic hypoxic respiratory failure.  Her o2sets dropped to mid 80's, she is not symptomatic from it, likely her body has tolerated hypoxia for a while, daughter reports to me that patient has had low oxygen level at doctor's office for a while. Daughter also reports patient snore at night, will benefit from sleep study as well.   Consultants:  Cardiology on 2/9  Procedures:  none  Antibiotics:  Vanc/cefepime from admission to 2/11  unsyn from 2/11   Discharge Exam: BP 126/75 (BP Location: Left Arm)   Pulse 95   Temp 98.1 F (36.7 C) (Oral)   Resp 18   Ht 5' (1.524 m)   Wt 76.8 kg (169 lb 5 oz)   SpO2 95%   BMI 33.07 kg/m    General:  NAD, aphasia, right hemiplegia  Cardiovascular: RRR  Respiratory: overall diminished, less Rhonchi, less rales, no wheezing  Abdomen: Soft/ND/NT, positive BS  Musculoskeletal: right arm Edema is improving  Neuro: alert, aphasia, right hemiplegia   Discharge Instructions You were cared for by a hospitalist during your hospital stay. If you have any questions about your discharge medications or the care you received while you were in the hospital after you are discharged, you can call the unit and asked to speak with the hospitalist on call  if the hospitalist that took care of you is not available. Once you are discharged, your primary care physician will handle any further medical issues. Please note that NO REFILLS for any discharge medications will be authorized once you are discharged, as it is imperative that you return to your  primary care physician (or establish a relationship with a primary care physician if you do not have one) for your aftercare needs so that they can reassess your need for medications and monitor your lab values.  Discharge Instructions    Diet - low sodium heart healthy   Complete by:  As directed    Thin liquid;Dysphagia 2 (Fine chop)   Liquid Administration via: Cup;Straw Medication Administration: Crushed with puree Supervision: Full supervision/cueing for compensatory strategies;Patient able to self feed Compensations: Minimize environmental distractions;Slow rate;Small sips/bites;Follow solids with liquid Postural Changes: Seated upright at 90 degrees;Remain upright for at least 30 minutes after po intake(daughter requests pt be seated in chair for meals, if at all possible)   Increase activity slowly   Complete by:  As directed      Allergies as of 02/17/2017      Reactions   Valtrex [valacyclovir Hcl] Cough   Cough and gagging      Medication List    TAKE these medications   acetaminophen 160 MG/5ML liquid Commonly known as:  TYLENOL Take 480 mg by mouth every 4 (four) hours as needed for fever or pain.   albuterol 108 (90 Base) MCG/ACT inhaler Commonly known as:  PROVENTIL HFA Inhale 2 puffs into the lungs every 6 (six) hours as needed for wheezing or shortness of breath.   ALIGN 4 MG Caps Take 4 mg by mouth daily.   amoxicillin-clavulanate 875-125 MG tablet Commonly known as:  AUGMENTIN Take 1 tablet by mouth 2 (two) times daily.   calamine lotion Apply 1 application as needed topically for itching.   diclofenac sodium 1 % Gel Commonly known as:  VOLTAREN APPLY (2GMS) TOPICALLY THREE TIMES DAILY.   guaiFENesin 100 MG/5ML Soln Commonly known as:  ROBITUSSIN Take 5 mLs (100 mg total) by mouth 2 (two) times daily.   hydroxyurea 500 MG capsule Commonly known as:  HYDREA Take 1 tablet (500 mg) Monday through Friday, take 2 tablets ( 1000 mg ) Saturday and  Sunday   ipratropium-albuterol 0.5-2.5 (3) MG/3ML Soln Commonly known as:  DUONEB Take 3 mLs by nebulization every 6 (six) hours as needed. What changed:  reasons to take this   lidocaine 5 % Commonly known as:  LIDODERM Place 1 patch daily onto the skin. Remove & Discard patch within 12 hours or as directed by MD What changed:    when to take this  reasons to take this  additional instructions   methimazole 5 MG tablet Commonly known as:  TAPAZOLE TAKE 1 TABLET BY MOUTH TWICE WEEKLY   polyethylene glycol packet Commonly known as:  MIRALAX / GLYCOLAX TAKE 17G BY MOUTH TWICE A DAY What changed:  See the new instructions.   pregabalin 25 MG capsule Commonly known as:  LYRICA Take 1 capsule (25 mg total) by mouth 2 (two) times daily.   propranolol 10 MG tablet Commonly known as:  INDERAL Take 0.5 tablets (5 mg total) by mouth 2 (two) times daily.   PROTONIX 40 mg/20 mL Pack Generic drug:  pantoprazole sodium TAKE 20 MLS (40 MG TOTAL) BY MOUTH DAILY.   terconazole 0.4 % vaginal cream Commonly known as:  TERAZOL 7 Place 1 applicator vaginally  at bedtime. What changed:    when to take this  reasons to take this   traMADol 50 MG tablet Commonly known as:  ULTRAM TAKE 2 TABLETS BY MOUTH 3 TIMES A DAY AS NEEDED   traZODone 50 MG tablet Commonly known as:  DESYREL TAKE 1 TABLET (50 MG TOTAL) BY MOUTH AT BEDTIME AS NEEDED. FOR SLEEP   triamcinolone ointment 0.5 % Commonly known as:  KENALOG Apply 1 application topically 2 (two) times daily.   warfarin 4 MG tablet Commonly known as:  COUMADIN Take as directed. If you are unsure how to take this medication, talk to your nurse or doctor. Original instructions:  TAKE AS DIRECTED BY ANTICOAGULATION CLINIC. What changed:  See the new instructions.   warfarin 2 MG tablet Commonly known as:  COUMADIN Take as directed. If you are unsure how to take this medication, talk to your nurse or doctor. Original instructions:   Take as directed by anticoagulation clinic. What changed:    how much to take  how to take this  when to take this  additional instructions            Durable Medical Equipment  (From admission, onward)        Start     Ordered   02/17/17 0952  DME Oxygen  Once    Question Answer Comment  Mode or (Route) Nasal cannula   Liters per Minute 2   Frequency Continuous (stationary and portable oxygen unit needed)   Oxygen conserving device Yes   Oxygen delivery system Gas      02/17/17 0951   02/16/17 0955  For home use only DME Other see comment  Once    Comments:  [employer],suction supplies   02/16/17 0955     Allergies  Allergen Reactions  . Valtrex [Valacyclovir Hcl] Cough    Cough and gagging   Follow-up Information    [facility] Follow up.   Specialty:  Home Health Services Why:  nursing, physical/occupational/speech therapy/aide/social worker Contact information: [place] [address] [phone]        Sandy Rail, MD Follow up in 1 week(s).   Specialty:  Internal Medicine Why:  hospital discharge follow up, repeat cbc/bmp at follow up. pmd to arrange outpatient sleep study repeat cxr in 3-4 weeks to follow up on aspiration pneumonia. Contact information: [place] [address] [phone]        [facility] - Dme Follow up.   Why:  suciton supplies,oxygen Contact information: [address] [place] [address] [phone]        check INR on Friday,and follow up with coumadin clinic regarding coumadin dosing. Follow up.            The results of significant diagnostics from this hospitalization (including imaging, microbiology, ancillary and laboratory) are listed below for reference.      Microbiology: Recent Results (from the past 240 hour(s))  Culture, blood (x 2)     Status: None   Collection Time: 02/08/17  6:31 AM  Result Value Ref Range Status    Specimen Description   Final    BLOOD LEFT ARM Performed at [facility] [address], [place], [place] [address]    Special Requests   Final    IN PEDIATRIC BOTTLE Blood Culture adequate volume Performed at [facility] [address], [place], [place] [address]    Culture  Final    NO GROWTH 5 DAYS Performed at [facility], [place] [address], [place], [place] [address]    Report Status 02/13/2017 FINAL  Final  Culture, blood (x 2)     Status: None   Collection Time: 02/08/17  6:31 AM  Result Value Ref Range Status   Specimen Description   Final    BLOOD LEFT ARM Performed at [facility] [address] [place], [place] [address]    Special Requests   Final    IN PEDIATRIC BOTTLE Blood Culture adequate volume Performed at [facility] [address] [place], [place] [address]    Culture   Final    NO GROWTH 5 DAYS Performed at [facility], [place] [address] [place], [place] [address]    Report Status 02/13/2017 FINAL  Final  MRSA PCR Screening     Status: Abnormal   Collection Time: 02/08/17  3:41 PM  Result Value Ref Range Status   MRSA by PCR POSITIVE (A) NEGATIVE Final    Comment:        The GeneXpert MRSA Assay (FDA approved for NASAL specimens only), is one component of a comprehensive MRSA colonization surveillance program. It is not intended to diagnose MRSA infection nor to guide or monitor treatment for MRSA infections. RESULT CALLED TO, READ BACK BY AND VERIFIED WITHLoletha Grayer PHILLIPS,RN [id number] @ 2683 BY J SCOTTON Performed at [facility], [place] [address], [place], [place] [address]   Culture, blood (routine x 2) Call MD if unable to obtain prior to antibiotics being given     Status: None (Preliminary result)   Collection Time: 02/12/17  8:27 PM  Result Value Ref Range Status   Specimen Description   Final    BLOOD LEFT HAND Performed at [facility] [address], [place], [place] [address]    Special Requests   Final    IN PEDIATRIC BOTTLE Blood Culture adequate volume Performed at [facility] [address], [place], [place] [address]    Culture   Final    NO GROWTH 4 DAYS Performed at [facility], [place] [address], [place], [place] [address]    Report Status PENDING  Incomplete  Culture, blood (routine x 2) Call MD if unable to obtain prior to antibiotics being given     Status: None (Preliminary result)   Collection Time: 02/12/17  8:36 PM  Result Value Ref Range Status   Specimen Description   Final    BLOOD LEFT FOREARM Performed at [facility] [address] [place], [place] [address]    Special Requests   Final    IN PEDIATRIC BOTTLE Blood Culture adequate volume Performed at [facility] [address] [place], [place] [address]    Culture   Final    NO GROWTH 4 DAYS Performed at [facility], [place] [address] [place], [place] [address]    Report Status PENDING  Incomplete     Labs: Basic Metabolic Panel: Recent Labs  Lab 02/12/17 1531 02/13/17 0554 02/14/17 0505 02/15/17 0512 02/16/17 0515 02/17/17 0450  NA 143  --  144 146* 145 145  K 3.4*  --  2.8* 3.3* 3.6 4.0  CL 108  --  110 108 105 104  CO2 29  --  _0 GLUCOSE 102*  --  92 98 96 97  BUN 9  --  _0 CREATININE 0.32* 0.32* <0.30* 0.33* <0.30* 0.31*  CALCIUM 8.4*  --  8.1* 8.3* 8.4* 8.8*  MG  --   --   --  1.6* 2.1 2.1   Liver Function Tests: Recent Labs  Lab 02/12/17 1531  AST 18  ALT 43  ALKPHOS 87  BILITOT 0.9  PROT 5.7*  ALBUMIN 2.7*   No results for input(s): LIPASE, AMYLASE in the last 168 hours. No results for input(s): AMMONIA in the last 168 hours. CBC: Recent Labs  Lab 02/12/17 1531 02/14/17 0505 02/15/17 0512 02/16/17 0515 02/17/17 0450  WBC 9.2 7.0 6.9 6.7 8.1  NEUTROABS 7.4 5.0 5.1 5.1 6.0  HGB 13.0  12.2 12.8 11.7* 12.8  HCT 40.1 37.4 39.1 35.5* 39.6  MCV 116.6* 116.9* 118.5* 118.7* 119.6*  PLT 412* 460* 474* 455* 489*   Cardiac Enzymes: Recent Labs  Lab 02/12/17 1531  TROPONINI 0.10*   BNP: BNP (last 3 results) Recent Labs    02/12/17 1531  BNP 899.5*    ProBNP (last 3 results) No results for input(s): PROBNP in the last 8760 hours.  CBG: No results for input(s): GLUCAP in the last 168 hours.     Signed:  Florencia Reasons MD, PhD  [facility] 02/17/2017, 5:21 PM

## 2017-02-17 NOTE — Progress Notes (Signed)
Patient desat to 80% on 0.5L O2 while sleeping. Patient O2 turned up to 1.5L and SpO2 is 94%

## 2017-02-17 NOTE — Care Management Note (Signed)
Case Management Note  Patient Details  Name: Sandy Barrett MRN: [id number] Date of Birth: 1933/01/04  Subjective/Objective:Spoke to dtr [name] in rm about d/c plans-she misunderstood about needing a higher level of transportation back home & needing home 02 continuously, suction supplies-now voiced understanding of [facility] to be used & if after hours [facility] for transportation. Her concerns about transportation going forward-CSW will address.Dtr states she always ride w/her mom while transporting,she has the key,& she doesn't drive,& limited w/money to pay for transportation. Dr. Erlinda Hong to address home 02 concern continuously w/dtr-[facility] rep Santiago Glad has explained to dtr that currently medicare will not cover home 02-until form is signed [facility] rep is unable to go forward with providing home 02-MD notified. [facility] rep awaiting to provide dme-home 02,suction supplies to patient's home prior d/c. Patient will transport home by [facility] if ready prior 9p, if after [facility]-nurse notified. [facility] forms in shadow chart.[facility] rep Lattie Haw aware of d/c today w/HHC services.                   Action/Plan:d/c home w/HHC/[facility]me[facility]   Expected Discharge Date:  02/17/17               Expected Discharge Plan:  [place]  In-House Referral:  NA  Discharge planning Services  CM Consult  Post Acute Care Choice:  Home Health Choice offered to:  Patient, Adult Children  DME Arranged:  Oxygen, Suction DME Agency:  [place]:  RN, PT, Nurse's Aide, OT, Social Work, Theme park manager Therapy HH Agency:  [facility] [employer]  Status of Service:  Completed, signed off  If discussed at [employer], dates discussed:    Additional Comments:  Dessa Phi, RN 02/17/2017, 10:33 AM

## 2017-02-18 ENCOUNTER — Telehealth: Payer: Self-pay | Admitting: *Deleted

## 2017-02-18 DIAGNOSIS — J189 Pneumonia, unspecified organism: Secondary | ICD-10-CM | POA: Diagnosis not present

## 2017-02-18 DIAGNOSIS — A419 Sepsis, unspecified organism: Secondary | ICD-10-CM | POA: Diagnosis not present

## 2017-02-18 LAB — CULTURE, BLOOD (ROUTINE X 2)
Culture: NO GROWTH
Culture: NO GROWTH
Special Requests: ADEQUATE
Special Requests: ADEQUATE

## 2017-02-18 NOTE — Progress Notes (Deleted)
Subjective:    Patient ID: Sandy Barrett, female    DOB: 1932/11/23, 84 y.o.   MRN: [id number]  HPI The patient is here for follow up from the hospital.  Admitted 02/07/17-02/10/17.  She was brought to the emergency room by her daughter after vomiting twice the day before.  There is no other obvious symptoms.  In the emergency room she had elevated LFTs and a chest x-ray concerning for possible pneumonia.  CT of the abdomen and pelvis showed possibility of acute cholecystitis and consolidation in the lung.  Blood cultures remain negative.  She was started on empiric antibiotics.  She was admitted for SIRS secondary to pneumonia and possible cholecystitis.  Surgery and IR was consulted for possible Colecystitis.  She was deemed not an ideal surgical candidate.  HIDA scan showed delayed filling of th GB.  She was placed on zosyn.  Her symptoms improved with conservative measures.  She was tolerating PO.  She was transitioned to Augmentin for 7 more days and discharged to home.  She was instructed to follow-up with surgery as an outpatient.  Admitted 02/12/17-02/17/17 for community-acquired pneumonia, sepsis.  Recommendations for Outpatient Follow-up:  1. F/u with PMD within a week  for hospital discharge follow up, repeat cbc/bmp at follow up. pmd to refer patient to have sleep study, repeat cxr in 3-4 weeks to follow up on aspiration pneumonia 2. Check INR on Friday, follow up with coumadin clinic    her daughter brought her to the emergency room for congestion, subjective fevers and shortness of breath.  She had a fever the night before and the cough was dry.  Chest x-ray showed patchy consolidation in both lung bases.  The degree of basilar pneumonia must be of concern.  Small left pleural effusion.  She was hypoxic with saturations on room air in the 70s reports.  Flu is negative.  Blood cultures showed no growth.  She was started on cefepime and vancomycin.  She clinically improved.  Antibiotics  changed to Unasyn to cover aspiration pneumonia.  She was discharged on Augmentin.  She did have a repeat swallow evaluation on 2/11 and her diet was recommended to thin liquid and fine chopped food.  She did receive IV Lasix in the hospital.  She had an echocardiogram that showed no significant abnormalities.  This was done for mildly elevated troponin.  Telemetry was unremarkable.  She was continued on her warfarin.   Medications and allergies reviewed with patient and updated if appropriate.  Patient Active Problem List   Diagnosis Date Noted  . Sepsis ([place]) 02/13/2017  . Pneumonia 02/12/2017  . HCAP (healthcare-associated pneumonia) 02/12/2017  . Elevated troponin   . Nausea & vomiting 02/08/2017  . CAP (community acquired pneumonia) 02/08/2017  . SIRS (systemic inflammatory response syndrome) ([place]) 02/08/2017  . Abnormal liver function tests   . Neuralgia 01/09/2017  . Abnormal urine odor 10/28/2016  . Venous congestion 10/08/2016  . Lymphedema of breast 09/11/2016  . Long term (current) use of anticoagulants [Z79.01] 09/10/2016  . Cough 08/23/2016  . Left hip pain 08/09/2016  . Yeast infection of the skin 07/01/2016  . Abnormal stool color 05/18/2016  . Left leg swelling 05/18/2016  . Pressure injury of skin 04/27/2016  . Aspiration pneumonia ([place]) 04/20/2016  . Hemidiaphragm paralysis   . Wheelchair bound 02/03/2016  . Vaginal bleeding 01/28/2016  . Discoloration of skin 09/26/2015  . Essential thrombocytosis ([place]) 07/31/2015  . SVC (superior vena cava obstruction), chronic 07/20/2015  .  Dysphagia 07/20/2015  . Elevated liver enzymes 07/20/2015  . Constipation 07/20/2015  . Thrombophlebitis of breast, right 06/18/2015  . Tracheal deviation 06/06/2015  . Sensorineural hearing loss, bilateral, moderate-moderately severe 04/30/2015  . Abdominal wall lump 03/09/2015  . Frequent UTI 02/06/2015  . Urinary tract infection without hematuria 08/21/2014  . Primary  osteoarthritis involving multiple joints 07/26/2014  . Pernicious anemia 07/26/2014  . Hearing loss 07/26/2014  . Encounter for therapeutic drug monitoring 07/17/2014  . Spastic hemiparesis affecting dominant side ([place]), right 05/31/2014  . DVT of lower extremity, bilateral ([place]) 03/14/2014  . Global aphasia 03/14/2014  . 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 Aphasia S/P CVA 03/13/2014  . Cerebral infarction due to embolism of left middle cerebral artery ([place])   . Stroke, embolic ([place]) 09/32/3557  . History of pulmonary embolism 03/08/2014  . Back pain 08/29/2013  . Primary localized osteoarthrosis, lower leg 03/06/2013  . IBS (irritable bowel syndrome)   . Multinodular goiter 01/13/2011  . Hyperthyroidism 11/11/2009  . Insomnia 07/03/2009  . POSTTRAUMATIC STRESS DISORDER 08/22/2008  . OBESITY 04/21/2008  . Osteoarthritis 04/21/2008  . MIGRAINE HEADACHE 04/20/2008  . Essential hypertension 04/20/2008    Current Outpatient Medications on File Prior to Visit  Medication Sig Dispense Refill  . acetaminophen (TYLENOL) 160 MG/5ML liquid Take 480 mg by mouth every 4 (four) hours as needed for fever or pain.    [name] albuterol (PROVENTIL HFA) 108 (90 Base) MCG/ACT inhaler Inhale 2 puffs into the lungs every 6 (six) hours as needed for wheezing or shortness of breath. (Patient not taking: Reported on 02/08/2017) 1 Inhaler 1  . amoxicillin-clavulanate (AUGMENTIN) 875-125 MG tablet Take 1 tablet by mouth 2 (two) times daily. 14 tablet 0  . calamine lotion Apply 1 application as needed topically for itching. 120 mL 0  . diclofenac sodium (VOLTAREN) 1 % GEL APPLY (2GMS) TOPICALLY THREE TIMES DAILY. 300 g 5  . guaiFENesin (ROBITUSSIN) 100 MG/5ML SOLN Take 5 mLs (100 mg total) by mouth 2 (two) times daily. 1200 mL 0  . hydroxyurea (HYDREA) 500 MG capsule Take 1 tablet (500 mg) Monday through Friday, take 2 tablets ( 1000 mg ) Saturday and Sunday 90 capsule 3  . ipratropium-albuterol (DUONEB) 0.5-2.5 (3)  MG/3ML SOLN Take 3 mLs by nebulization every 6 (six) hours as needed. (Patient taking differently: Take 3 mLs by nebulization every 6 (six) hours as needed (shortness of breath). ) 360 mL 0  . lidocaine (LIDODERM) 5 % Place 1 patch daily onto the skin. Remove & Discard patch within 12 hours or as directed by MD (Patient taking differently: Place 1 patch onto the skin daily as needed (pain). Remove & Discard patch within 12 hours or as directed by MD) 15 patch 0  . methimazole (TAPAZOLE) 5 MG tablet TAKE 1 TABLET BY MOUTH TWICE WEEKLY 12 tablet 1  . polyethylene glycol (MIRALAX / GLYCOLAX) packet TAKE 17G BY MOUTH TWICE A DAY (Patient taking differently: TAKE 17G BY MOUTH TWICE A DAY AS NEED FOR CONSTIPATION) 14 packet 1  . pregabalin (LYRICA) 25 MG capsule Take 1 capsule (25 mg total) by mouth 2 (two) times daily. 60 capsule 3  . Probiotic Product (ALIGN) 4 MG CAPS Take 4 mg by mouth daily.     . propranolol (INDERAL) 10 MG tablet Take 0.5 tablets (5 mg total) by mouth 2 (two) times daily. 60 tablet 5  . PROTONIX 40 MG PACK TAKE 20 MLS (40 MG TOTAL) BY MOUTH DAILY. 30 each 5  .  terconazole (TERAZOL 7) 0.4 % vaginal cream Place 1 applicator vaginally at bedtime. (Patient taking differently: Place 1 applicator vaginally at bedtime as needed (irritation). ) 90 g 3  . traMADol (ULTRAM) 50 MG tablet TAKE 2 TABLETS BY MOUTH 3 TIMES A DAY AS NEEDED 180 tablet 1  . traZODone (DESYREL) 50 MG tablet TAKE 1 TABLET (50 MG TOTAL) BY MOUTH AT BEDTIME AS NEEDED. FOR SLEEP 30 tablet 5  . triamcinolone ointment (KENALOG) 0.5 % Apply 1 application topically 2 (two) times daily. (Patient not taking: Reported on 02/12/2017) 30 g 0  . warfarin (COUMADIN) 2 MG tablet Take as directed by anticoagulation clinic. (Patient taking differently: Take 2 mg by mouth See admin instructions. Take 2 mg every tues,wed, thurs, sat, & sun) 30 tablet 3  . warfarin (COUMADIN) 4 MG tablet TAKE AS DIRECTED BY ANTICOAGULATION CLINIC. (Patient  taking differently: Take 4 mg by mouth every Friday and  Monday) 40 tablet 3   No current facility-administered medications on file prior to visit.     Past Medical History:  Diagnosis Date  . ARTHRITIS   . Arthritis   . Diverticulosis   . Essential thrombocytosis ([place])   . HYPERTENSION   . HYPERTHYROIDISM   . Hyperthyroidism    s/p I-131 ablation 03/2011 of multinod goiter  . INSOMNIA   . MIGRAINE HEADACHE   . OBESITY   . Posttraumatic stress disorder   . Pulmonary embolism ([place]) 03/2014   with DVT  . Stroke [facility]) 03/2014   dysarthria    Past Surgical History:  Procedure Laterality Date  . ABDOMINAL HYSTERECTOMY  1976  . BREAST SURGERY     biopsy  . RADIOLOGY WITH ANESTHESIA Left 03/08/2014   Procedure: RADIOLOGY WITH ANESTHESIA;  Surgeon: Rob Hickman, MD;  Location: [facility];  Service: Radiology;  Laterality: Left;  . TONSILLECTOMY    . TOTAL HIP ARTHROPLASTY  1998    right    Social History   Socioeconomic History  . Marital status: Widowed    Spouse name: Not on file  . Number of children: 2  . Years of education: 47  . Highest education level: Not on file  Social Needs  . Financial resource strain: Not on file  . Food insecurity - worry: Not on file  . Food insecurity - inability: Not on file  . Transportation needs - medical: Not on file  . Transportation needs - non-medical: Not on file  Occupational History  . Occupation: retired    Comment: Restaurant manager, fast food  Tobacco Use  . Smoking status: Never Smoker  . Smokeless tobacco: Never Used  Substance and Sexual Activity  . Alcohol use: No    Alcohol/week: 0.0 oz  . Drug use: No  . Sexual activity: No  Other Topics Concern  . Not on file  Social History Narrative   Widowed, lived alone prior to CVA 03/2014   SNF at [facility], then home 07/2014 with dtr   Right handed   Caffeine use- occasionally drinks tea    Family History  Problem Relation Age of Onset  . Asthma Mother   . Asthma  Father   . Prostate cancer Father   . Stroke Brother   . Breast cancer Sister   . Stomach cancer Sister   . Thyroid disease Neg Hx     Review of Systems     Objective:  There were no vitals filed for this visit. Wt Readings from Last 3 Encounters:  02/13/17 169  lb 5 oz (76.8 kg)  02/10/17 172 lb 9.9 oz (78.3 kg)  11/25/16 180 lb (81.6 kg)   There is no height or weight on file to calculate BMI.   Physical Exam    Constitutional: Appears well-developed and well-nourished. No distress.  HENT:  Head: Normocephalic and atraumatic.  Neck: Neck supple. No tracheal deviation present. No thyromegaly present.  No cervical lymphadenopathy Cardiovascular: Normal rate, regular rhythm and normal heart sounds.   No murmur heard. No carotid bruit .  No edema Pulmonary/Chest: Effort normal and breath sounds normal. No respiratory distress. No has no wheezes. No rales.  Skin: Skin is warm and dry. Not diaphoretic.  Psychiatric: Normal mood and affect. Behavior is normal.      Assessment & Plan:    See Problem List for Assessment and Plan of chronic medical problems.

## 2017-02-18 NOTE — Telephone Encounter (Signed)
Transition Care Management Follow-up Telephone Call   Date discharged? 02/17/17   How have you been since you were released from the hospital? Spoke w/daughter Sandy Barrett) she states mom is doing about the same, weak   Do you understand why you were in the hospital? YES   Do you understand the discharge instructions? YES   Where were you discharged to? Home   Items Reviewed:  Medications reviewed: YEs  Allergies reviewed: YES  Dietary changes reviewed: YES  Referrals reviewed: No referral needed   Functional Questionnaire:   Activities of Daily Living (ADLs):   She states she are independent in the following: feeding, continence and toileting States they require assistance with the following: ambulation, bathing and hygiene, grooming and dressing   Any transportation issues/concerns?: NO   Any patient concerns? NO   Confirmed importance and date/time of follow-up visits scheduled YES, appt 02/19/17  Provider Appointment booked with Dr. Quay Burow  Confirmed with patient if condition begins to worsen call PCP or go to the ER.  Patient was given the office number and encouraged to call back with question or concerns.  : YES

## 2017-02-19 ENCOUNTER — Telehealth: Payer: Self-pay | Admitting: Internal Medicine

## 2017-02-19 ENCOUNTER — Inpatient Hospital Stay: Payer: Medicare Other | Admitting: Internal Medicine

## 2017-02-19 ENCOUNTER — Ambulatory Visit: Payer: Medicare Other

## 2017-02-19 NOTE — Telephone Encounter (Signed)
Copied from [place]. Topic: Quick Communication - See Telephone Encounter >> Feb 19, 2017 11:57 AM Burnis Medin, NT wrote: CRM for notification. See Telephone encounter for: Patient daughter called and said the hospital gave patient after care at [facility]. Daughter said the nurse told her to adjust patient's oxygen while sitting up and alert to strengthen her lungs and wanted to know if this is ok. She would like a call back.   02/19/17.

## 2017-02-19 NOTE — Telephone Encounter (Signed)
Not sure what adjustment she means - increasing or decreasing oxygen level?

## 2017-02-19 NOTE — Telephone Encounter (Signed)
Spoke with pt's daughter, states that she was told by home heatlh nurse that when a pt is put on O2 they can become dependent on it and in order to prevent that when the pt is sitting up and alert with a pulse on, they can turn o2 down from 2 LPM to 1 lpm or off and if the O2 sat goes below 90% then O2 needs to be put back on. Advised daughter that per Dr Quay Burow this is okay to do.    They also recommended a sleep study to evaluate her 02 sats at night, daughter is unsure if she can tolerate this and may be interested in an over night pulse ox to evaluate for night o2 only.   Pt is coming in on Tues and can discuss this further at appt.

## 2017-02-20 DIAGNOSIS — J189 Pneumonia, unspecified organism: Secondary | ICD-10-CM | POA: Diagnosis not present

## 2017-02-20 DIAGNOSIS — A419 Sepsis, unspecified organism: Secondary | ICD-10-CM | POA: Diagnosis not present

## 2017-02-22 ENCOUNTER — Ambulatory Visit: Payer: Medicare Other | Admitting: Internal Medicine

## 2017-02-22 DIAGNOSIS — A419 Sepsis, unspecified organism: Secondary | ICD-10-CM | POA: Diagnosis not present

## 2017-02-22 DIAGNOSIS — J189 Pneumonia, unspecified organism: Secondary | ICD-10-CM | POA: Diagnosis not present

## 2017-02-22 NOTE — Progress Notes (Signed)
Subjective:    Patient ID: Sandy Barrett, female    DOB: 08-11-1932, 84 y.o.   MRN: [id number]  HPI The patient is here for follow up from the hospital.  Admitted 02/07/17-02/10/17.  She was brought to the emergency room by her daughter after vomiting twice the day before.  There is no other obvious symptoms.  In the emergency room she had elevated LFTs and a chest x-ray concerning for possible pneumonia.  CT of the abdomen and pelvis showed possibility of acute cholecystitis and consolidation in the lung.  Blood cultures remain negative.  She was started on empiric antibiotics.  She was admitted for SIRS secondary to pneumonia and possible cholecystitis.  Surgery and IR was consulted for possible Colecystitis.  She was deemed not an ideal surgical candidate.  HIDA scan showed delayed filling of th GB.  She was placed on zosyn.  Her symptoms improved with conservative measures.  She was tolerating PO.  She was transitioned to Augmentin for 7 more days and discharged to home.  She was instructed to follow-up with surgery as an outpatient.    Admitted 02/12/17-02/17/17 for community-acquired pneumonia, sepsis.  Recommendations for Outpatient Follow-up:  1. F/u with PMD within a week  for hospital discharge follow up, repeat cbc/bmp at follow up. pmd to refer patient to have sleep study, repeat cxr in 3-4 weeks to follow up on aspiration pneumonia 2. Check INR on Friday, follow up with coumadin clinic   Her daughter brought her to the emergency room for congestion, subjective fevers and shortness of breath.  She had a fever the night before and the cough was dry.  Chest x-ray showed patchy consolidation in both lung bases.  The degree of basilar pneumonia must be of concern.  Small left pleural effusion.  She was hypoxic with saturations on room air in the 70s reports.  Flu is negative.  Blood cultures showed no growth.  She was started on cefepime and vancomycin.  She clinically improved.   Antibiotics changed to Unasyn to cover aspiration pneumonia.  She was discharged on Augmentin.  She did have a repeat swallow evaluation on 2/11 and her diet was recommended to thin liquid and fine chopped food.  She did receive IV Lasix in the hospital.  She had an echocardiogram that showed no significant abnormalities.  This was done for mildly elevated troponin.  Telemetry was unremarkable.  She was continued on her warfarin.  Recurrent aspiration PNA/ acute hypoxic respiratory failure (chronic hypoxia suspected):  She is currently on 2 L via [place].  Her daughter denies any SOB.  She is sleeping deeper and hard to arise.  Her oxygen level did get to 82% on RA. She completed her antibiotic this monring.    Continue oxygen 24 /7 hrs on 2 L via [place] - can adjust if needed Will get oxygen concentrator that is small and easy for transport recheck cbc, cmp Recheck cxr in 3-4 weeks to confirm resolution - ordered  Swallow eval repeat in hospital:  recurrent aspiration PNA Fine chop, dysphagia 2, thin liquid Appetite improving  Small left pleural effusion: Received IV lasix Will get f/u cxr in a few weeks - lungs clear today, but poor inspiratory effort  Hypokalemia/hypomagnesemia: repleted in hospital cmp today  Troponin mildly elevated, no chest pain Likely related to PNA Echo showed no abnormalities Telemetry unremarkable  H/o PE, on warfarin Check INR today - will have home health draw  Hypertension Continued on home medications  Right arm  edema Had [place] - neg for DVT On warfarin  Recent cholecystitis: LFT normal in hospital Repeat cmp today Seems to be asymptomatic Not a surgical candidate - no need to follow up with surgery at this time - monitor for recurrent symptoms  Medications and allergies reviewed with patient and updated if appropriate.  Patient Active Problem List   Diagnosis Date Noted  . Cholecystitis 02/23/2017  . Sepsis ([place]) 02/13/2017  . Pneumonia 02/12/2017   . HCAP (healthcare-associated pneumonia) 02/12/2017  . Elevated troponin   . Nausea & vomiting 02/08/2017  . CAP (community acquired pneumonia) 02/08/2017  . SIRS (systemic inflammatory response syndrome) ([place]) 02/08/2017  . Abnormal liver function tests   . Neuralgia 01/09/2017  . Abnormal urine odor 10/28/2016  . Venous congestion 10/08/2016  . Lymphedema of breast 09/11/2016  . Long term (current) use of anticoagulants [Z79.01] 09/10/2016  . Cough 08/23/2016  . Left hip pain 08/09/2016  . Yeast infection of the skin 07/01/2016  . Abnormal stool color 05/18/2016  . Left leg swelling 05/18/2016  . Pressure injury of skin 04/27/2016  . Aspiration pneumonia ([place]) 04/20/2016  . Hemidiaphragm paralysis   . Wheelchair bound 02/03/2016  . Vaginal bleeding 01/28/2016  . Discoloration of skin 09/26/2015  . Essential thrombocytosis ([place]) 07/31/2015  . SVC (superior vena cava obstruction), chronic 07/20/2015  . Dysphagia 07/20/2015  . Elevated liver enzymes 07/20/2015  . Constipation 07/20/2015  . Thrombophlebitis of breast, right 06/18/2015  . Tracheal deviation 06/06/2015  . Sensorineural hearing loss, bilateral, moderate-moderately severe 04/30/2015  . Abdominal wall lump 03/09/2015  . Frequent UTI 02/06/2015  . Urinary tract infection without hematuria 08/21/2014  . Primary osteoarthritis involving multiple joints 07/26/2014  . Pernicious anemia 07/26/2014  . Hearing loss 07/26/2014  . Encounter for therapeutic drug monitoring 07/17/2014  . Spastic hemiparesis affecting dominant side ([place]), right 05/31/2014  . DVT of lower extremity, bilateral ([place]) 03/14/2014  . Global aphasia 03/14/2014  . 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 Aphasia S/P CVA 03/13/2014  . Cerebral infarction due to embolism of left middle cerebral artery ([place])   . Stroke, embolic ([place]) 27/25/3664  . History of pulmonary embolism 03/08/2014  . Back pain 08/29/2013  . Primary localized osteoarthrosis, lower  leg 03/06/2013  . IBS (irritable bowel syndrome)   . Multinodular goiter 01/13/2011  . Hyperthyroidism 11/11/2009  . Insomnia 07/03/2009  . POSTTRAUMATIC STRESS DISORDER 08/22/2008  . OBESITY 04/21/2008  . Osteoarthritis 04/21/2008  . MIGRAINE HEADACHE 04/20/2008  . Essential hypertension 04/20/2008    Current Outpatient Medications on File Prior to Visit  Medication Sig Dispense Refill  . acetaminophen (TYLENOL) 160 MG/5ML liquid Take 480 mg by mouth every 4 (four) hours as needed for fever or pain.    [name] albuterol (PROVENTIL HFA) 108 (90 Base) MCG/ACT inhaler Inhale 2 puffs into the lungs every 6 (six) hours as needed for wheezing or shortness of breath. 1 Inhaler 1  . calamine lotion Apply 1 application as needed topically for itching. 120 mL 0  . diclofenac sodium (VOLTAREN) 1 % GEL APPLY (2GMS) TOPICALLY THREE TIMES DAILY. 300 g 5  . guaiFENesin (ROBITUSSIN) 100 MG/5ML SOLN Take 5 mLs (100 mg total) by mouth 2 (two) times daily. 1200 mL 0  . hydroxyurea (HYDREA) 500 MG capsule Take 1 tablet (500 mg) Monday through Friday, take 2 tablets ( 1000 mg ) Saturday and Sunday 90 capsule 3  . ipratropium-albuterol (DUONEB) 0.5-2.5 (3) MG/3ML SOLN Take 3 mLs by nebulization every 6 (six) hours  as needed. (Patient taking differently: Take 3 mLs by nebulization every 6 (six) hours as needed (shortness of breath). ) 360 mL 0  . lidocaine (LIDODERM) 5 % Place 1 patch daily onto the skin. Remove & Discard patch within 12 hours or as directed by MD (Patient taking differently: Place 1 patch onto the skin daily as needed (pain). Remove & Discard patch within 12 hours or as directed by MD) 15 patch 0  . methimazole (TAPAZOLE) 5 MG tablet TAKE 1 TABLET BY MOUTH TWICE WEEKLY 12 tablet 1  . polyethylene glycol (MIRALAX / GLYCOLAX) packet TAKE 17G BY MOUTH TWICE A DAY (Patient taking differently: TAKE 17G BY MOUTH TWICE A DAY AS NEED FOR CONSTIPATION) 14 packet 1  . pregabalin (LYRICA) 25 MG capsule Take 1  capsule (25 mg total) by mouth 2 (two) times daily. 60 capsule 3  . Probiotic Product (ALIGN) 4 MG CAPS Take 4 mg by mouth daily.     . propranolol (INDERAL) 10 MG tablet Take 0.5 tablets (5 mg total) by mouth 2 (two) times daily. 60 tablet 5  . PROTONIX 40 MG PACK TAKE 20 MLS (40 MG TOTAL) BY MOUTH DAILY. 30 each 5  . terconazole (TERAZOL 7) 0.4 % vaginal cream Place 1 applicator vaginally at bedtime. (Patient taking differently: Place 1 applicator vaginally at bedtime as needed (irritation). ) 90 g 3  . traMADol (ULTRAM) 50 MG tablet TAKE 2 TABLETS BY MOUTH 3 TIMES A DAY AS NEEDED 180 tablet 1  . traZODone (DESYREL) 50 MG tablet TAKE 1 TABLET (50 MG TOTAL) BY MOUTH AT BEDTIME AS NEEDED. FOR SLEEP 30 tablet 5  . triamcinolone ointment (KENALOG) 0.5 % Apply 1 application topically 2 (two) times daily. 30 g 0  . warfarin (COUMADIN) 2 MG tablet Take as directed by anticoagulation clinic. (Patient taking differently: Take 2 mg by mouth See admin instructions. Take 2 mg every tues,wed, thurs, sat, & sun) 30 tablet 3  . warfarin (COUMADIN) 4 MG tablet TAKE AS DIRECTED BY ANTICOAGULATION CLINIC. (Patient taking differently: Take 4 mg by mouth every Friday and  Monday) 40 tablet 3   No current facility-administered medications on file prior to visit.     Past Medical History:  Diagnosis Date  . ARTHRITIS   . Arthritis   . Diverticulosis   . Essential thrombocytosis ([place])   . HYPERTENSION   . HYPERTHYROIDISM   . Hyperthyroidism    s/p I-131 ablation 03/2011 of multinod goiter  . INSOMNIA   . MIGRAINE HEADACHE   . OBESITY   . Posttraumatic stress disorder   . Pulmonary embolism ([place]) 03/2014   with DVT  . Stroke [facility]) 03/2014   dysarthria    Past Surgical History:  Procedure Laterality Date  . ABDOMINAL HYSTERECTOMY  1976  . BREAST SURGERY     biopsy  . RADIOLOGY WITH ANESTHESIA Left 03/08/2014   Procedure: RADIOLOGY WITH ANESTHESIA;  Surgeon: Rob Hickman, MD;  Location: [facility];   Service: Radiology;  Laterality: Left;  . TONSILLECTOMY    . TOTAL HIP ARTHROPLASTY  1998    right    Social History   Socioeconomic History  . Marital status: Widowed    Spouse name: Not on file  . Number of children: 2  . Years of education: 14  . Highest education level: Not on file  Social Needs  . Financial resource strain: Not on file  . Food insecurity - worry: Not on file  . Food insecurity - inability:  Not on file  . Transportation needs - medical: Not on file  . Transportation needs - non-medical: Not on file  Occupational History  . Occupation: retired    Comment: Restaurant manager, fast food  Tobacco Use  . Smoking status: Never Smoker  . Smokeless tobacco: Never Used  Substance and Sexual Activity  . Alcohol use: No    Alcohol/week: 0.0 oz  . Drug use: No  . Sexual activity: No  Other Topics Concern  . Not on file  Social History Narrative   Widowed, lived alone prior to CVA 03/2014   SNF at [facility], then home 07/2014 with dtr   Right handed   Caffeine use- occasionally drinks tea    Family History  Problem Relation Age of Onset  . Asthma Mother   . Asthma Father   . Prostate cancer Father   . Stroke Brother   . Breast cancer Sister   . Stomach cancer Sister   . Thyroid disease Neg Hx     Review of Systems  Constitutional: Positive for appetite change (slow improvement). Negative for fever.  Respiratory: Negative for cough, shortness of breath and wheezing.   Cardiovascular: Negative for chest pain.  Gastrointestinal: Positive for constipation. Negative for abdominal pain.       Objective:   Vitals:   02/23/17 1312  BP: (!) 104/58  Pulse: 84  Resp: 16  Temp: 97.7 F (36.5 C)  SpO2: 92%   Wt Readings from Last 3 Encounters:  02/23/17 176 lb (79.8 kg)  02/13/17 169 lb 5 oz (76.8 kg)  02/10/17 172 lb 9.9 oz (78.3 kg)   Body mass index is 34.37 kg/m.   Physical Exam    Constitutional: Appears well-developed and well-nourished. No  distress.  HENT:  Head: Normocephalic and atraumatic.  Neck: Neck supple. No tracheal deviation present. thyromegaly present.  No cervical lymphadenopathy Cardiovascular: Normal rate, regular rhythm and normal heart sounds.   No murmur heard. No carotid bruit .  Minimal LLE edema, RUE edema Pulmonary/Chest: Effort normal and breath sounds normal, but very poor inspiratory effort. No respiratory distress. No has no wheezes. No rales.  Abdomen: soft, non tender, non distended Skin: Skin is warm and dry. Not diaphoretic.    In ED, 02/12/17 CXR IMPRESSION: 1. Marked thyroid enlargement with marked deviation of the thoracic trachea toward the right with thoracic tracheal narrowing, stable.  2. Patchy consolidation both lung bases. A degree of bibasilar pneumonia must be of concern.  3. Small left pleural effusion.  4. Stable cardiac prominence.  5. Small granulomas left upper lobe.  6. Aortic atherosclerosis.  7. Bones osteoporotic.     ECHOCARDIOGRAM [facility].                        [place], [place] [address]                            [phone]  ------------------------------------------------------------------- Transthoracic Echocardiography  Patient:    Destanie, Tibbetts  MR #:       [id number] Study Date: 02/13/2017 Gender:     F Age:        67 Height:     152.4 cm Weight:     78.3 kg BSA:        1.86 m^2 Pt. Status: Room:       NW29   Jetty Duhamel, M.D.  REFERRING    Jenkins Rouge, M.D.  ATTENDING    Virgel Manifold [id number]  PERFORMING   [facility], Inpatient  SONOGRAPHER  Mikki Santee  cc:  ------------------------------------------------------------------- LV EF: 60% -   65%  ------------------------------------------------------------------- Indications:      Pericardial effusion  423.9.  ------------------------------------------------------------------- History:   PMH:   Pulmonary embolus.  Risk factors:  Hypertension.   ------------------------------------------------------------------- Study Conclusions  - Left ventricle: The cavity size was normal. Systolic function was   normal. The estimated ejection fraction was in the range of 60%   to 65%. Wall motion was normal; there were no regional wall   motion abnormalities. Left ventricular diastolic function   parameters were normal. - Mitral valve: There was mild regurgitation. - Right ventricle: The cavity size was mildly dilated. Wall   thickness was normal. - Atrial septum: No defect or patent foramen ovale was identified. - Pulmonary arteries: PA peak pressure: 45 mm Hg (S).  ------------------------------------------------------------------- Study data:  No prior study was available for comparison.  Study status:  STAT.  Procedure:  The patient reported no pain pre or post test. Transthoracic echocardiography. Image quality was adequate.  Study completion:  There were no complications. Transthoracic echocardiography.  M-mode, complete 2D, spectral Doppler, and color Doppler.  Birthdate:  Patient birthdate: Jun 28, 1932.  Age:  Patient is 84 yr old.  Sex:  Gender: female. BMI: 33.7 kg/m^2.  Blood pressure:     125/95  Patient status: Inpatient.  Study date:  Study date: 02/13/2017. Study time: 08:22 AM.  Location:  Emergency department.  -------------------------------------------------------------------  ------------------------------------------------------------------- Left ventricle:  The cavity size was normal. Systolic function was normal. The estimated ejection fraction was in the range of 60% to 65%. Wall motion was normal; there were no regional wall motion abnormalities. The transmitral flow pattern was normal. The deceleration time of the early transmitral flow velocity was normal. The  pulmonary vein flow pattern was normal. The tissue Doppler parameters were normal. Left ventricular diastolic function parameters were normal.  ------------------------------------------------------------------- Aortic valve:   Trileaflet; normal thickness leaflets. Mobility was not restricted.  Doppler:  Transvalvular velocity was within the normal range. There was no stenosis. There was no regurgitation.   ------------------------------------------------------------------- Aorta:  The aorta was normal, not dilated, and non-diseased. Aortic root: The aortic root was normal in size.  ------------------------------------------------------------------- Mitral valve:   Mildly thickened leaflets . Mobility was not restricted.  Doppler:  Transvalvular velocity was within the normal range. There was no evidence for stenosis. There was mild regurgitation.  ------------------------------------------------------------------- Left atrium:  The atrium was normal in size.  ------------------------------------------------------------------- Atrial septum:  No defect or patent foramen ovale was identified.   ------------------------------------------------------------------- Right ventricle:  The cavity size was mildly dilated. Wall thickness was normal. Systolic function was normal.  ------------------------------------------------------------------- Pulmonic valve:    Doppler:  Transvalvular velocity was within the normal range. There was no evidence for stenosis. There was mild regurgitation.  ------------------------------------------------------------------- Tricuspid valve:   Structurally normal valve.    Doppler: Transvalvular velocity was within the normal range. There was mild regurgitation.  -------------------------------------------------------------------  Pulmonary artery:   The main pulmonary artery was normal-sized. Systolic pressure was within the normal  range.  ------------------------------------------------------------------- Right atrium:  The atrium was normal in size.  ------------------------------------------------------------------- Pericardium:  The pericardium was normal in appearance. There was no pericardial effusion.  ------------------------------------------------------------------- Systemic veins: Inferior vena cava: The vessel was dilated. The respirophasic diameter changes were blunted (< 50%), consistent with elevated central venous pressure.  ------------------------------------------------------------------- Post procedure conclusions Ascending Aorta:  - The aorta was normal, not dilated, and non-diseased.  ------------------------------------------------------------------- Measurements   Left ventricle                         Value        Reference  LV ID, ED, PLAX chordal        (L)     34.4  mm     43 - 52  LV ID, ES, PLAX chordal                23.8  mm     23 - 38  LV fx shortening, PLAX chordal         31    %      >=29  LV PW thickness, ED                    9.3   mm     ----------  IVS/LV PW ratio, ED                    1.16         <=1.3  Stroke volume, 2D                      62    ml     ----------  Stroke volume/bsa, 2D                  33    ml/m^2 ----------  LV e&', lateral                         8.59  cm/s   ----------  LV E/e&', lateral                       7.29         ----------  LV e&', medial                          5.87  cm/s   ----------  LV E/e&', medial                        10.66        ----------  LV e&', average                         7.23  cm/s   ----------  LV E/e&', average                       8.66         ----------    Ventricular septum                     Value        Reference  IVS thickness, ED  10.8  mm     ----------    LVOT                                   Value        Reference  LVOT ID, S                             19    mm      ----------  LVOT area                              2.84  cm^2   ----------  LVOT peak velocity, S                  114   cm/s   ----------  LVOT mean velocity, S                  67.2  cm/s   ----------  LVOT VTI, S                            21.9  cm     ----------  LVOT peak gradient, S                  5     mm Hg  ----------    Aorta                                  Value        Reference  Aortic root ID, ED                     31    mm     ----------    Left atrium                            Value        Reference  LA ID, A-P, ES                         26    mm     ----------  LA ID/bsa, A-P                         1.4   cm/m^2 <=2.2  LA volume, ES, 1-p A4C                 39.5  ml     ----------  LA volume/bsa, ES, 1-p A4C             21.3  ml/m^2 ----------  LA volume, ES, 1-p A2C                 28.8  ml     ----------  LA volume/bsa, ES, 1-p A2C             15.5  ml/m^2 ----------    Mitral valve                           Value  Reference  Mitral E-wave peak velocity            62.6  cm/s   ----------  Mitral A-wave peak velocity            89.7  cm/s   ----------  Mitral deceleration time       (L)     144   ms     150 - 230  Mitral E/A ratio, peak                 0.7          ----------    Pulmonary arteries                     Value        Reference  PA pressure, S, DP             (H)     45    mm Hg  <=30    Tricuspid valve                        Value        Reference  Tricuspid regurg peak velocity         303   cm/s   ----------  Tricuspid peak RV-RA gradient          37    mm Hg  ----------    Right atrium                           Value        Reference  RA ID, S-I, ES, A4C            (H)     63.3  mm     34 - 49  RA area, ES, A4C               (H)     19.7  cm^2   8.3 - 19.5  RA volume, ES, A/L                     49.3  ml     ----------  RA volume/bsa, ES, A/L                 26.6  ml/m^2 ----------    Systemic veins                         Value         Reference  Estimated CVP                          8     mm Hg  ----------    Right ventricle                        Value        Reference  TAPSE                                  21.7  mm     ----------  RV pressure, S, DP             (H)     45    mm Hg  <=30  RV s&', lateral, S  16.6  cm/s   ----------  Legend: (L)  and  (H)  mark values outside specified reference range.  ------------------------------------------------------------------- Prepared and Electronically Authenticated by  Jenkins Rouge, M.D. 2019-02-09T12:54:58    Assessment & Plan:    See Problem List for Assessment and Plan of chronic medical problems.

## 2017-02-23 ENCOUNTER — Ambulatory Visit (INDEPENDENT_AMBULATORY_CARE_PROVIDER_SITE_OTHER): Payer: Medicare Other | Admitting: Internal Medicine

## 2017-02-23 ENCOUNTER — Other Ambulatory Visit (INDEPENDENT_AMBULATORY_CARE_PROVIDER_SITE_OTHER): Payer: Medicare Other

## 2017-02-23 ENCOUNTER — Encounter: Payer: Self-pay | Admitting: Internal Medicine

## 2017-02-23 VITALS — BP 104/58 | HR 84 | Temp 97.7°F | Resp 16 | Wt 176.0 lb

## 2017-02-23 DIAGNOSIS — Z7901 Long term (current) use of anticoagulants: Secondary | ICD-10-CM

## 2017-02-23 DIAGNOSIS — J9601 Acute respiratory failure with hypoxia: Secondary | ICD-10-CM

## 2017-02-23 DIAGNOSIS — R748 Abnormal levels of other serum enzymes: Secondary | ICD-10-CM | POA: Diagnosis not present

## 2017-02-23 DIAGNOSIS — A419 Sepsis, unspecified organism: Secondary | ICD-10-CM | POA: Diagnosis not present

## 2017-02-23 DIAGNOSIS — K819 Cholecystitis, unspecified: Secondary | ICD-10-CM | POA: Diagnosis not present

## 2017-02-23 DIAGNOSIS — J690 Pneumonitis due to inhalation of food and vomit: Secondary | ICD-10-CM | POA: Diagnosis not present

## 2017-02-23 DIAGNOSIS — J189 Pneumonia, unspecified organism: Secondary | ICD-10-CM | POA: Diagnosis not present

## 2017-02-23 DIAGNOSIS — J9611 Chronic respiratory failure with hypoxia: Secondary | ICD-10-CM | POA: Insufficient documentation

## 2017-02-23 DIAGNOSIS — R7989 Other specified abnormal findings of blood chemistry: Secondary | ICD-10-CM

## 2017-02-23 DIAGNOSIS — R778 Other specified abnormalities of plasma proteins: Secondary | ICD-10-CM

## 2017-02-23 LAB — PROTIME-INR
INR: 4.5 ratio — ABNORMAL HIGH (ref 0.8–1.0)
Prothrombin Time: 48.4 s — ABNORMAL HIGH (ref 9.6–13.1)

## 2017-02-23 LAB — CBC WITH DIFFERENTIAL/PLATELET
Basophils Absolute: 0 10*3/uL (ref 0.0–0.1)
Basophils Relative: 0.4 % (ref 0.0–3.0)
Eosinophils Absolute: 0.1 10*3/uL (ref 0.0–0.7)
Eosinophils Relative: 2.1 % (ref 0.0–5.0)
HCT: 41 % (ref 36.0–46.0)
Hemoglobin: 13.3 g/dL (ref 12.0–15.0)
Lymphocytes Relative: 22.7 % (ref 12.0–46.0)
Lymphs Abs: 0.9 10*3/uL (ref 0.7–4.0)
MCHC: 32.5 g/dL (ref 30.0–36.0)
MCV: 119.1 fl — ABNORMAL HIGH (ref 78.0–100.0)
Monocytes Absolute: 0.3 10*3/uL (ref 0.1–1.0)
Monocytes Relative: 7.8 % (ref 3.0–12.0)
Neutro Abs: 2.7 10*3/uL (ref 1.4–7.7)
Neutrophils Relative %: 67 % (ref 43.0–77.0)
Platelets: 364 10*3/uL (ref 150.0–400.0)
RBC: 3.45 Mil/uL — ABNORMAL LOW (ref 3.87–5.11)
RDW: 14.5 % (ref 11.5–15.5)
WBC: 4 10*3/uL (ref 4.0–10.5)

## 2017-02-23 LAB — COMPREHENSIVE METABOLIC PANEL
ALT: 8 U/L (ref 0–35)
AST: 10 U/L (ref 0–37)
Albumin: 3.2 g/dL — ABNORMAL LOW (ref 3.5–5.2)
Alkaline Phosphatase: 70 U/L (ref 39–117)
BUN: 7 mg/dL (ref 6–23)
CO2: 35 mEq/L — ABNORMAL HIGH (ref 19–32)
Calcium: 9.1 mg/dL (ref 8.4–10.5)
Chloride: 103 mEq/L (ref 96–112)
Creatinine, Ser: 0.31 mg/dL — ABNORMAL LOW (ref 0.40–1.20)
GFR: 263.4 mL/min (ref >60.00)
Glucose, Bld: 90 mg/dL (ref 70–99)
Potassium: 3.9 mEq/L (ref 3.5–5.1)
Sodium: 139 mEq/L (ref 135–145)
Total Bilirubin: 0.8 mg/dL (ref 0.2–1.2)
Total Protein: 6.3 g/dL (ref 6.0–8.3)

## 2017-02-23 MED ORDER — Q-CARE COVERD YANKAUER/SUCTION MT MISC: OROMUCOSAL | 5 refills | Status: AC

## 2017-02-23 NOTE — Assessment & Plan Note (Signed)
H/o recurrent aspiration pna Has swallow eval - doing dysphagia 2 diet, fine chop and thin liquids High risk of aspiration Symptoms improved, completed antibiotics Repeat/follow up cxr in 3-4 weeks

## 2017-02-23 NOTE — Assessment & Plan Note (Signed)
Echo showed no significant abnormalities

## 2017-02-23 NOTE — Assessment & Plan Note (Signed)
Completed abx today Symptoms improving Still with hypoxia - continue oxygen via [place] 2 L/min Repeat/ follow up cxr in 3-4 weeks

## 2017-02-23 NOTE — Patient Instructions (Addendum)
Have blood work done today and a chest xray done in 3-4 weeks.   Test(s) ordered today. Your results will be released to [place] (or called to you) after review, usually within 72hours after test completion. If any changes need to be made, you will be notified at that same time.   Medications reviewed and updated.  No changes recommended at this time.   Please followup in 3 months

## 2017-02-23 NOTE — Assessment & Plan Note (Signed)
lfts resolved No symptoms cmp today

## 2017-02-23 NOTE — Assessment & Plan Note (Signed)
?   Acute or acute on chronic On 2 L via [place] for now - monitor oxygen sat at home - adjust oxygen as needed Will home off on nighttime oximetry test since she needs the oxygen  Will defer OSA eval for now - she will likely not tolerate this and will likely not tolerate cpap Repeat cxr in 3-4 weeks

## 2017-02-24 ENCOUNTER — Telehealth: Payer: Self-pay | Admitting: Emergency Medicine

## 2017-02-24 DIAGNOSIS — J9691 Respiratory failure, unspecified with hypoxia: Secondary | ICD-10-CM

## 2017-02-24 DIAGNOSIS — A419 Sepsis, unspecified organism: Secondary | ICD-10-CM | POA: Diagnosis not present

## 2017-02-24 DIAGNOSIS — J189 Pneumonia, unspecified organism: Secondary | ICD-10-CM | POA: Diagnosis not present

## 2017-02-24 NOTE — Telephone Encounter (Signed)
Agree with pulmonary referral.  Have her start keeping tract of her oxygen level - what has her oxygen level been when she is sleeping so deep?  Referral ordered

## 2017-02-24 NOTE — Telephone Encounter (Signed)
Spoke with daughter to give different options for oxygen therapy.  1. Stay with [facility] and use in home oxygen concentrator and keep portable she has not but will not be able to go above 2 lpm with portable. Can increase to over 2 lpm with home concentrator.   2. Switch to [employer] and have eval with RT for pulsing o2 for smaller concentrator.   3. Put referral into pulm and have eval with them.   Pts daughter states pt is showing no sign of distress if o2 get low, she would like to start referral process for Pulm. And she is okay with keeping current concentrator and adding in home O2 concentrator. I did advise that this could be changed after pt was recovered from pneumonia.  She does have the concern that pt is sleeping very deep at night and having to physically wake her up, this was not happening before be put on o2.

## 2017-02-25 ENCOUNTER — Telehealth: Payer: Self-pay | Admitting: General Practice

## 2017-02-25 ENCOUNTER — Ambulatory Visit (INDEPENDENT_AMBULATORY_CARE_PROVIDER_SITE_OTHER): Payer: Medicare Other | Admitting: General Practice

## 2017-02-25 ENCOUNTER — Telehealth: Payer: Self-pay | Admitting: Emergency Medicine

## 2017-02-25 DIAGNOSIS — J189 Pneumonia, unspecified organism: Secondary | ICD-10-CM | POA: Diagnosis not present

## 2017-02-25 DIAGNOSIS — Z7901 Long term (current) use of anticoagulants: Secondary | ICD-10-CM

## 2017-02-25 DIAGNOSIS — A419 Sepsis, unspecified organism: Secondary | ICD-10-CM | POA: Diagnosis not present

## 2017-02-25 LAB — POCT INR: INR: 3.5

## 2017-02-25 NOTE — Telephone Encounter (Signed)
Order signed.

## 2017-02-25 NOTE — Telephone Encounter (Signed)
Spoke with pts daughter. She would like to proceed with the home concentrator. I will faxed signed order to [facility] tomorrow.

## 2017-02-25 NOTE — Telephone Encounter (Signed)
No warfarin today then 2 mg on Friday then resume usually dosing.  Recheck INR on Tuesday  - send results to [facility]

## 2017-02-25 NOTE — Telephone Encounter (Signed)
Left dosing instructions on VM.  Dosing instructions also given to Virgilio Frees, RN @ [name].

## 2017-02-25 NOTE — Telephone Encounter (Signed)
Received call from [facility] Nurse. INR 3.5.. PT 41.4   CB [phone]

## 2017-02-25 NOTE — Patient Instructions (Signed)
Pre visit review using our clinic review tool, if applicable. No additional management support is needed unless otherwise documented below in the visit note.  Skip dosage today (2/21) and tomorrow (2/22) and then give patient 2 mg on Saturday and Sunday and 4 mg on Monday.  Re-check on Tuesday. Dosing instructions given to Buzz, RN @ [place] while in patient's home.

## 2017-02-26 NOTE — Telephone Encounter (Signed)
Sandy Barrett has already spoke with [place] and pts daughter. Nothing further is needed.

## 2017-02-26 NOTE — Progress Notes (Signed)
Noted  

## 2017-02-26 NOTE — Telephone Encounter (Signed)
Orders faxed

## 2017-02-27 DIAGNOSIS — J189 Pneumonia, unspecified organism: Secondary | ICD-10-CM | POA: Diagnosis not present

## 2017-02-27 DIAGNOSIS — A419 Sepsis, unspecified organism: Secondary | ICD-10-CM | POA: Diagnosis not present

## 2017-03-01 DIAGNOSIS — A419 Sepsis, unspecified organism: Secondary | ICD-10-CM | POA: Diagnosis not present

## 2017-03-01 DIAGNOSIS — J189 Pneumonia, unspecified organism: Secondary | ICD-10-CM | POA: Diagnosis not present

## 2017-03-02 ENCOUNTER — Ambulatory Visit (INDEPENDENT_AMBULATORY_CARE_PROVIDER_SITE_OTHER): Payer: Medicare Other | Admitting: General Practice

## 2017-03-02 ENCOUNTER — Ambulatory Visit: Payer: Medicare Other | Admitting: Physical Medicine & Rehabilitation

## 2017-03-02 DIAGNOSIS — I824Y3 Acute embolism and thrombosis of unspecified deep veins of proximal lower extremity, bilateral: Secondary | ICD-10-CM

## 2017-03-02 DIAGNOSIS — Z7901 Long term (current) use of anticoagulants: Secondary | ICD-10-CM | POA: Diagnosis not present

## 2017-03-02 DIAGNOSIS — A419 Sepsis, unspecified organism: Secondary | ICD-10-CM | POA: Diagnosis not present

## 2017-03-02 DIAGNOSIS — J189 Pneumonia, unspecified organism: Secondary | ICD-10-CM | POA: Diagnosis not present

## 2017-03-02 LAB — POCT INR: INR: 2

## 2017-03-02 NOTE — Patient Instructions (Addendum)
Pre visit review using our clinic review tool, if applicable. No additional management support is needed unless otherwise documented below in the visit note.  Continue to take 2 mg all days except 4 mg on Monday and Fridays.  Re-check in 1 week. Dosing instructions given to Buzz, RN @ [place] while in patient's home.

## 2017-03-03 ENCOUNTER — Ambulatory Visit: Payer: Medicare Other | Admitting: Podiatry

## 2017-03-03 DIAGNOSIS — J189 Pneumonia, unspecified organism: Secondary | ICD-10-CM | POA: Diagnosis not present

## 2017-03-03 DIAGNOSIS — A419 Sepsis, unspecified organism: Secondary | ICD-10-CM | POA: Diagnosis not present

## 2017-03-03 NOTE — Progress Notes (Signed)
Agree with management.  Sandy Weikel J Trenesha Alcaide, MD  

## 2017-03-04 ENCOUNTER — Telehealth: Payer: Self-pay | Admitting: Emergency Medicine

## 2017-03-04 DIAGNOSIS — J189 Pneumonia, unspecified organism: Secondary | ICD-10-CM | POA: Diagnosis not present

## 2017-03-04 DIAGNOSIS — A419 Sepsis, unspecified organism: Secondary | ICD-10-CM | POA: Diagnosis not present

## 2017-03-04 NOTE — Telephone Encounter (Signed)
FYI: Received VM from pts daughter. O2 running in the 90s during the night. No distress. More of a drop when up during the day. Appt with pulm next week.

## 2017-03-04 NOTE — Telephone Encounter (Signed)
Noted.  No changes needed.

## 2017-03-05 DIAGNOSIS — A419 Sepsis, unspecified organism: Secondary | ICD-10-CM | POA: Diagnosis not present

## 2017-03-05 DIAGNOSIS — J189 Pneumonia, unspecified organism: Secondary | ICD-10-CM | POA: Diagnosis not present

## 2017-03-08 ENCOUNTER — Telehealth: Payer: Self-pay | Admitting: Emergency Medicine

## 2017-03-08 DIAGNOSIS — A419 Sepsis, unspecified organism: Secondary | ICD-10-CM | POA: Diagnosis not present

## 2017-03-08 DIAGNOSIS — J189 Pneumonia, unspecified organism: Secondary | ICD-10-CM | POA: Diagnosis not present

## 2017-03-08 NOTE — Telephone Encounter (Signed)
It will be difficult to tell.  If she has urinary symptoms or we are not sure we can check her urine.

## 2017-03-08 NOTE — Telephone Encounter (Signed)
Pt daughter is concerned that pt is staying in bed much more than usual since her hospital stay. She does not know if its recovering or another UTI. Please advise.

## 2017-03-09 ENCOUNTER — Encounter: Payer: Medicare Other | Attending: Physical Medicine & Rehabilitation

## 2017-03-09 ENCOUNTER — Ambulatory Visit (INDEPENDENT_AMBULATORY_CARE_PROVIDER_SITE_OTHER): Payer: Medicare Other | Admitting: General Practice

## 2017-03-09 ENCOUNTER — Encounter: Payer: Self-pay | Admitting: Physical Medicine & Rehabilitation

## 2017-03-09 ENCOUNTER — Ambulatory Visit (HOSPITAL_BASED_OUTPATIENT_CLINIC_OR_DEPARTMENT_OTHER): Payer: Medicare Other | Admitting: Physical Medicine & Rehabilitation

## 2017-03-09 VITALS — BP 94/60 | HR 103 | Resp 14

## 2017-03-09 DIAGNOSIS — M1712 Unilateral primary osteoarthritis, left knee: Secondary | ICD-10-CM | POA: Diagnosis not present

## 2017-03-09 DIAGNOSIS — Z7901 Long term (current) use of anticoagulants: Secondary | ICD-10-CM

## 2017-03-09 DIAGNOSIS — G8110 Spastic hemiplegia affecting unspecified side: Secondary | ICD-10-CM | POA: Diagnosis not present

## 2017-03-09 DIAGNOSIS — J189 Pneumonia, unspecified organism: Secondary | ICD-10-CM | POA: Diagnosis not present

## 2017-03-09 DIAGNOSIS — A419 Sepsis, unspecified organism: Secondary | ICD-10-CM | POA: Diagnosis not present

## 2017-03-09 LAB — POCT INR: INR: 3.1

## 2017-03-09 NOTE — Telephone Encounter (Signed)
Spoke with pts daughter. She would like to go ahead and have a urine collected. Contacted [facility] and they will be seeing her today at 15 and verbal orders were given for UA and UC and in and out Cath if needed.

## 2017-03-09 NOTE — Patient Instructions (Addendum)
Pre visit review using our clinic review tool, if applicable. No additional management support is needed unless otherwise documented below in the visit note.  Skip dosage today and then continue to take 2 mg all days except 4 mg on Monday and Fridays.  Re-check in 1 week. Dosing instructions given to Buzz, RN @ [place] while in patient's home.

## 2017-03-09 NOTE — Progress Notes (Signed)
Subjective:    Patient ID: Sandy Barrett, female    DOB: 1932/10/27, 84 y.o.   MRN: [id number]  HPI  Dysport 01/20/2016 FDP 200 Pronator quadratus 100  No RUE issues post injection.   Interval hx of hsopitalization for PNA.  Now on home O2 with pulmonary  F/u scheduled  Daughter is concerned about wheelchair positioning.  The patient seems to slump more towards the right side.  She is not sure whether the patient has any discomfort on the left side.  Patient currently receiving home health PT services.  She has not discussed wheelchair positioning with physical therapy yet. Patient is globally aphasic.  Pain Inventory Average Pain 5 Pain Right Now 6 My pain is intermittent, sharp, stabbing and aching  In the last 24 hours, has pain interfered with the following? General activity 6 Relation with others 6 Enjoyment of life 6 What TIME of day is your pain at its worst? n/a Sleep (in general) Fair  Pain is worse with: bending, sitting, inactivity and standing Pain improves with: rest, therapy/exercise, medication and injections Relief from Meds: 5  Mobility ability to climb steps?  no do you drive?  no use a wheelchair needs help with transfers Do you have any goals in this area?  no  Function retired  Neuro/Psych bladder control problems bowel control problems weakness trouble walking spasms dizziness  Prior Studies Any changes since last visit?  no  Physicians involved in your care Any changes since last visit?  no   Family History  Problem Relation Age of Onset  . Asthma Mother   . Asthma Father   . Prostate cancer Father   . Stroke Brother   . Breast cancer Sister   . Stomach cancer Sister   . Thyroid disease Neg Hx    Social History   Socioeconomic History  . Marital status: Widowed    Spouse name: None  . Number of children: 2  . Years of education: 81  . Highest education level: None  Social Needs  . Financial resource strain: None    . Food insecurity - worry: None  . Food insecurity - inability: None  . Transportation needs - medical: None  . Transportation needs - non-medical: None  Occupational History  . Occupation: retired    Comment: Restaurant manager, fast food  Tobacco Use  . Smoking status: Never Smoker  . Smokeless tobacco: Never Used  Substance and Sexual Activity  . Alcohol use: No    Alcohol/week: 0.0 oz  . Drug use: No  . Sexual activity: No  Other Topics Concern  . None  Social History Narrative   Widowed, lived alone prior to CVA 03/2014   SNF at [facility], then home 07/2014 with dtr   Right handed   Caffeine use- occasionally drinks tea   Past Surgical History:  Procedure Laterality Date  . ABDOMINAL HYSTERECTOMY  1976  . BREAST SURGERY     biopsy  . RADIOLOGY WITH ANESTHESIA Left 03/08/2014   Procedure: RADIOLOGY WITH ANESTHESIA;  Surgeon: Rob Hickman, MD;  Location: [facility];  Service: Radiology;  Laterality: Left;  . TONSILLECTOMY    . TOTAL HIP ARTHROPLASTY  1998    right   Past Medical History:  Diagnosis Date  . ARTHRITIS   . Arthritis   . Diverticulosis   . Essential thrombocytosis ([place])   . HYPERTENSION   . HYPERTHYROIDISM   . Hyperthyroidism    s/p I-131 ablation 03/2011 of multinod goiter  . INSOMNIA   .  MIGRAINE HEADACHE   . OBESITY   . Posttraumatic stress disorder   . Pulmonary embolism ([place]) 03/2014   with DVT  . Stroke ([place]) 03/2014   dysarthria   BP 94/60   Pulse (!) 103   Resp 14   SpO2 98%   Opioid Risk Score:   Fall Risk Score:  `1  Depression screen PHQ 2/9  Depression screen [employer] 2/9 08/21/2016 07/30/2015 06/21/2015  Decreased Interest 0 0 0  Down, Depressed, Hopeless 0 0 0  PHQ - 2 Score 0 0 0  Some recent data might be hidden    Review of Systems  Constitutional: Positive for unexpected weight change.  HENT: Negative.   Eyes: Negative.   Respiratory: Negative.   Cardiovascular: Negative.   Gastrointestinal: Negative.   Endocrine:  Negative.   Genitourinary: Negative.   Musculoskeletal: Positive for arthralgias and gait problem.       Spasms   Skin: Positive for rash.  Allergic/Immunologic: Negative.   Neurological: Positive for dizziness and weakness.  Hematological: Negative.   Psychiatric/Behavioral: Negative.        Objective:   Physical Exam  Tone she has Ashworth grade 1 spasticity in the finger flexors Ashworth 1 at the pectoralis Ashworth 0 at the biceps, Ashworth 1 at the pronators. Patient has pain to palpation over the medial aspect of the left knee.  She has a left knee valgus deformity.  She has no pain with hip range of motion.  No tenderness palpation around the hip girdle or in the buttock area no tenderness palpation in the low back area.  Cannot extend exam and lumbar range of motion secondary to immobility as well as her globally aphasia. She has 0/5 strength in the right upper and trace hip knee extensor synergy in the right lower.      Assessment & Plan:  1.  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 She has had good results with her Dysport and I would recommend repeating the same dose same muscle groups in 6 weeks. 2 wheelchair positioning has worsened secondary to overall deconditioning after her pneumonia.  She does have valgus deformity at the knee as well as right hemiparesis that is likely making her lean more towards the right side.  I do not appreciate any pain on the left side other than in the left knee.  She does have known osteoarthritis of the left knee but this should not affect her positioning. 3.  Left knee contracture with osteoarthritis of the knee has had good results with prior injections. Left Knee injection   Indication:Left Knee pain not relieved by medication management and other conservative care.  Informed consent was obtained after describing risks and benefits of the procedure with the patient, this includes  bleeding, bruising, infection and medication side effects. The patient wishes to proceed and has given written consent. The patient was placed in a recumbent position. The medial aspect of the knee was marked and prepped with Betadine and alcohol. It was then entered with a 25-gauge 1-1/2 inch needle  after negative draw back for blood, a solution containing one ML of 6mg  per mL betamethasone and 3 mL of 1% lidocaine were injected. The patient tolerated the procedure well. Post procedure instructions were given.

## 2017-03-10 ENCOUNTER — Ambulatory Visit: Payer: Medicare Other | Admitting: Internal Medicine

## 2017-03-10 DIAGNOSIS — J189 Pneumonia, unspecified organism: Secondary | ICD-10-CM | POA: Diagnosis not present

## 2017-03-10 DIAGNOSIS — A419 Sepsis, unspecified organism: Secondary | ICD-10-CM | POA: Diagnosis not present

## 2017-03-11 ENCOUNTER — Ambulatory Visit (INDEPENDENT_AMBULATORY_CARE_PROVIDER_SITE_OTHER): Payer: Medicare Other | Admitting: Internal Medicine

## 2017-03-11 ENCOUNTER — Encounter: Payer: Self-pay | Admitting: Internal Medicine

## 2017-03-11 ENCOUNTER — Ambulatory Visit (INDEPENDENT_AMBULATORY_CARE_PROVIDER_SITE_OTHER)
Admission: RE | Admit: 2017-03-11 | Discharge: 2017-03-11 | Disposition: A | Discharge status: Home / Self Care | Payer: Medicare Other | Source: Ambulatory Visit | Attending: Internal Medicine | Admitting: Internal Medicine

## 2017-03-11 VITALS — BP 112/70 | HR 77

## 2017-03-11 DIAGNOSIS — J9611 Chronic respiratory failure with hypoxia: Secondary | ICD-10-CM

## 2017-03-11 DIAGNOSIS — R1311 Dysphagia, oral phase: Secondary | ICD-10-CM | POA: Diagnosis not present

## 2017-03-11 DIAGNOSIS — A419 Sepsis, unspecified organism: Secondary | ICD-10-CM | POA: Diagnosis not present

## 2017-03-11 DIAGNOSIS — J398 Other specified diseases of upper respiratory tract: Secondary | ICD-10-CM

## 2017-03-11 DIAGNOSIS — J189 Pneumonia, unspecified organism: Secondary | ICD-10-CM | POA: Diagnosis not present

## 2017-03-11 DIAGNOSIS — J690 Pneumonitis due to inhalation of food and vomit: Secondary | ICD-10-CM | POA: Diagnosis not present

## 2017-03-11 NOTE — Telephone Encounter (Signed)
Buzz, nurse with [name] would like to know what Dr Quay Burow would like to do after now that the urine has done.  They have not heard anything, and would like to know if any med is going to be called in, ot nothing. Buzz would like a call back as soon as possible

## 2017-03-11 NOTE — Progress Notes (Signed)
Agree with management.  Charlea Nardo J Layann Bluett, MD  

## 2017-03-11 NOTE — Telephone Encounter (Signed)
Spoke with Buzz to inform. UA and UCX will be performed next week if pt shows signs or symptoms of UTI

## 2017-03-11 NOTE — Telephone Encounter (Signed)
Urinalysis was abnormal, but urine culture did not show an infection.   No antibiotic recommended at this time, but If concerning symptoms still present we may need to recheck UA, UCx

## 2017-03-11 NOTE — Patient Instructions (Addendum)
Ok to leave off 02 at rest   Please see patient coordinator before you leave today  to schedule overnight oximetry room air  Please remember to go to the  x-ray department downstairs in the basement  for your tests - we will call you with the results when they are available.   Pulmonary follow up is as needed as there are no medications or interventions that will be helpful at this point other than paying close attention to swallowing/ diet restrictions

## 2017-03-11 NOTE — Progress Notes (Signed)
Subjective:     Patient ID: Sandy Barrett, female   DOB: 06/30/1932,    MRN: [id number]  HPI   38 yobf never smoker with hbp/ thrombocytosis  > L Hem cva with R hemiparesis Sandy Barrett place in 2016 and living at her home since then daughter with pneumonia 2018 then feb 2019  Attributed to aspiration > DII diet and doing better since last admit:    Admit date: 02/12/2017 Discharge date: 02/17/2017   Discharge Diagnoses:      Active Hospital Problems   Diagnosis Date Noted  . Sepsis ([place]) 02/13/2017  . Pneumonia 02/12/2017  . HCAP (healthcare-associated pneumonia) 02/12/2017    Resolved Hospital Problems  No resolved problems to display.    Discharge Condition: stable  Diet recommendation: heart healthy  Thin liquid;Dysphagia 2 (Fine chop)  Liquid Administration via: Cup;Straw Medication Administration: Crushed with puree Supervision: Full supervision/cueing for compensatory strategies;Patient able to self feed Compensations: Minimize environmental distractions;Slow rate;Small sips/bites;Follow solids with liquid Postural Changes: Seated upright at 90 degrees;Remain upright for at least 30 minutes after po intake(daughter requests pt be seated in chair for meals, if at all possible)    Hospital Course:  Active Problems:   Pneumonia   HCAP (healthcare-associated pneumonia)   Sepsis ([place])   Recurrent aspiration pneumonia/acute hypoxic respiratory failure(suspect underlying chronic hypoxia) -She was hypoxic with saturations on room air in the 70s per EMS reports on presentation.  -flu negative, blood culture no growth, cough is not productive -Chest x-ray from admission   "Patchy consolidation both lung bases. A degree of bibasilar pneumonia must be of concern. Small left pleural effusion" - she received IV cefepime and vancomycin since admission, clinically improving, mrsa screen negative, change abx to unasyn to cover aspiration pneumonia, continue  mucinex,  -discharged on augmentin.   -repeat swallow eval on 2/11 due to choking episode of dysphagia 3 diet on 2/11 Diet modified per repeat swallow eval: Thin liquid;Dysphagia 2 (Fine chop)  Liquid Administration via: Cup;Straw Medication Administration: Crushed with puree Supervision: Full supervision/cueing for compensatory strategies;Patient able to self feed Compensations: Minimize environmental distractions;Slow rate;Small sips/bites;Follow solids with liquid Postural Changes: Seated upright at 90 degrees;Remain upright for at least 30 minutes after po intake(daughter requests pt be seated in chair for meals, if at all possible)  patient has multiple risks of aspiration, with h/o cva, bed to wheel chair bound, generalized weakness, aphasia, large goiter with deviated trachea   -Daughter wants to try diet modification, behavioral maneuver, before considering feeding tube placement. She also mentioned to follow with endocrinology for goiter treatment. She is hopeful that her goiter will shrink and that will also help the patient swallow.  Small left pleural effusion: She received iv lasix in the hospital. pmd to follow up.  Hypokalemia/hypomagnesemia: K/ Mag normalized  elevated troponin:Mild 0.1 , no chest pain . probably as a result of pneumonia.  Echocardiogram reviewed with no significant abnormalities cardiology initially consulted, signed off.  Tele personally reviewed, unremarkable, will d/c tele today.  History of PE: Continue warfarin per pharmacy. INR today is2.05  Essential thrombocytosis:Patient on hydroxyurea. Continue according to hematology Platelet 455.   Hypertension:Continue blood pressure control with Propranolol, lasix  history of CVA: Resided right hemiplegia , aphasia, has been bed to wheelchair bound since 2016 .at baseline-requires hoyer lift for transfers. she has 24/7 care at home.  Right arm edema,chronic  /intermittent per daughter negative DVT from recent venous Doppler. Been on Coumadin likely dependent edema, elevate arm lymphadema treatment on outpatient  basis   hyperparathyroidism: Marked thyroid enlargement with marked deviation of the thoracic trachea toward the right with thoracic tracheal narrowing, stable on x ray. Patient on Tapazole. We'll continue treatment   GERD:Continue PPIs  Recently diagnosed with cholecystitis (hospitalized from2/3-2/6 ), felt not a candidate for surgery or gallbaldder drain.  Patient does not have n/v this hospitalization, denies abdominal pain, lft wnl.    03/11/2017 1st [facility] office visit/ Derron Pipkins   Chief Complaint  Patient presents with  . Pulmonary Consult    Referred by Dr. Quay Burow.  Pt hospitalized with PNA Feb 2019 and sent home with supplemental o2 24/7 @ 2lpm.    remains w/c bound, hoyer dep but able to sleep with about 30 degrees elevation / no vomiting or obvious aspiration since last admit living at home with her daughter on 2lpm 24/7   No obvious day to day or daytime variability or assoc excess/ purulent sputum or mucus plugs or hemoptysis or cp or chest tightness, subjective wheeze or overt sinus or hb symptoms. No unusual exposure hx or h/o childhood pna/ asthma or knowledge of premature birth.  Sleeping ok  2lpm 30 degrees hob s nocturnal  or early am exacerbation  of respiratory  c/o's or need for noct saba. Also denies any obvious fluctuation of symptoms with weather or environmental changes or other aggravating or alleviating factors except as outlined above   Current Allergies, Complete Past Medical History, Past Surgical History, Family History, and Social History were reviewed in [employer] record.  ROS  The following are not active complaints unless bolded Hoarseness, sore throat, dysphagia, dental problems, itching, sneezing,  nasal congestion or discharge of excess mucus or purulent  secretions, ear ache,   fever, chills, sweats, unintended wt loss or wt gain, classically pleuritic or exertional cp,  orthopnea pnd or leg swelling, presyncope, palpitations, abdominal pain, anorexia, nausea, vomiting, diarrhea  or change in bowel habits or change in bladder habits, change in stools or change in urine, dysuria, hematuria,  rash, arthralgias, visual complaints, headache, numbness, weakness or ataxia or problems with walking or coordination,  change in mood/affect or memory.        Current Meds  Medication Sig  . acetaminophen (TYLENOL) 160 MG/5ML liquid Take 480 mg by mouth every 4 (four) hours as needed for fever or pain.  . calamine lotion Apply 1 application as needed topically for itching.  . diclofenac sodium (VOLTAREN) 1 % GEL APPLY (2GMS) TOPICALLY THREE TIMES DAILY.  [name] guaiFENesin (MUCINEX) 600 MG 12 hr tablet Take 600 mg by mouth 2 (two) times daily.  [name] guaiFENesin (ROBITUSSIN) 100 MG/5ML SOLN Take 5 mLs (100 mg total) by mouth 2 (two) times daily.  . hydroxyurea (HYDREA) 500 MG capsule Take 1 tablet (500 mg) Monday through Friday, take 2 tablets ( 1000 mg ) Saturday and Sunday  . ipratropium-albuterol (DUONEB) 0.5-2.5 (3) MG/3ML SOLN Take 3 mLs by nebulization every 6 (six) hours as needed. (Patient taking differently: Take 3 mLs by nebulization every 6 (six) hours as needed (shortness of breath). )  . lidocaine (LIDODERM) 5 % Place 1 patch daily onto the skin. Remove & Discard patch within 12 hours or as directed by MD (Patient taking differently: Place 1 patch onto the skin daily as needed (pain). Remove & Discard patch within 12 hours or as directed by MD)  . methimazole (TAPAZOLE) 5 MG tablet TAKE 1 TABLET BY MOUTH TWICE WEEKLY  . Oral Hygiene Products (Q-CARE COVERD YANKAUER/SUCTION) MISC  Use as directed  . OXYGEN 2lpm 24/7  DME- [facility]  . polyethylene glycol (MIRALAX / GLYCOLAX) packet TAKE 17G BY MOUTH TWICE A DAY (Patient taking differently: TAKE 17G BY MOUTH TWICE A  DAY AS NEED FOR CONSTIPATION)  . pregabalin (LYRICA) 25 MG capsule Take 1 capsule (25 mg total) by mouth 2 (two) times daily.  . Probiotic Product (ALIGN) 4 MG CAPS Take 4 mg by mouth daily.   . propranolol (INDERAL) 10 MG tablet Take 0.5 tablets (5 mg total) by mouth 2 (two) times daily.  [name] PROTONIX 40 MG PACK TAKE 20 MLS (40 MG TOTAL) BY MOUTH DAILY.  [name] terconazole (TERAZOL 7) 0.4 % vaginal cream Place 1 applicator vaginally at bedtime. (Patient taking differently: Place 1 applicator vaginally at bedtime as needed (irritation). )  . traMADol (ULTRAM) 50 MG tablet TAKE 2 TABLETS BY MOUTH 3 TIMES A DAY AS NEEDED  . traZODone (DESYREL) 50 MG tablet TAKE 1 TABLET (50 MG TOTAL) BY MOUTH AT BEDTIME AS NEEDED. FOR SLEEP  . triamcinolone ointment (KENALOG) 0.5 % Apply 1 application topically 2 (two) times daily.  [name] warfarin (COUMADIN) 2 MG tablet Take as directed by anticoagulation clinic. (Patient taking differently: Take 2 mg by mouth See admin instructions. Take 2 mg every tues,wed, thurs, sat, & sun)  . warfarin (COUMADIN) 4 MG tablet TAKE AS DIRECTED BY ANTICOAGULATION CLINIC. (Patient taking differently: Take 4 mg by mouth every Friday and  Monday)            Review of Systems     Objective:   Physical Exam W/c bound with dense expressive/ receptive aphasia  Wt Readings from Last 3 Encounters:  02/23/17 176 lb (79.8 kg)  02/13/17 169 lb 5 oz (76.8 kg)  02/10/17 172 lb 9.9 oz (78.3 kg)     Vital signs reviewed - Note on arrival 02 sats  100% on 2lpm NP  And 97% RA     HEENT: nl   turbinates bilaterally, and oropharynx though exam very limited due to receptive aphasia(see neuro) . Nl external ear canals without cough reflex - upper dentures    NECK :  without JVD/Nodes/TM/ nl carotid upstrokes bilaterally   LUNGS: no acc muscle use,  Nl contour chest with diminished bs in bases / no localized wheeze/rhonchi   CV:  RRR  no s3 or murmur or increase in P2, and no edema   ABD:   soft and nontender with nl inspiratory excursion. No bruits or organomegaly appreciated, bowel sounds nl  MS:   ext warm without deformities, calf tenderness, cyanosis or clubbing No obvious joint restrictions   SKIN: warm and dry without lesions    NEURO:  alert, dense receptive aphasia, unable to follow one step commands/ R hemiplegia  - note won't cough or stick her tongue out or even open her mouth to req     CXR PA and Lateral:   03/11/2017 :    I personally reviewed images and  impression as follows:    severe chronic R tracheal dev but no obst/ nl lung fields          Assessment:

## 2017-03-12 ENCOUNTER — Encounter: Payer: Self-pay | Admitting: Internal Medicine

## 2017-03-12 NOTE — Assessment & Plan Note (Signed)
sats 97% RA at rest 03/11/2017 p admit for asp pna 02/12/17  - ONO rec RA 03/11/2017   Her last hc03 level was 35 so she may be hypercarbic chronically as well and may well prove to need 02 at hs but no need at rest and since she is w/c bound no need during the day at all unless symptomatic

## 2017-03-12 NOTE — Assessment & Plan Note (Signed)
Likely related to stroke and displacement of esophagus by goiter  -repeat swallow eval on 2/11 due to choking episode of dysphagia 3 diet on 2/11 Diet modified per repeat swallow eval: Thin liquid;Dysphagia 2 (Fine chop)  Reviewed with diet/ also risk for aspiration from vomiting/ discussed limited options with daughter and inevitability of recurrence even with peg/ trach given inability to cough effectively on command today.  No indication for pulmonary intervention at this point and pulmonary f/u can be prn    Total time devoted to counseling  > 50 % of initial 60 min office visit:  review case including extensive Epic inpt records  with pt/daughter  discussion of options/alternatives/ personally creating written customized instructions  in presence of pt  then going over those specific  Instructions directly with the pt including how to use all of the meds but in particular covering each new medication in detail and the difference between the maintenance= "automatic" meds and the prns using an action plan format for the latter (If this problem/symptom => do that organization reading Left to right).  Please see AVS from this visit for a full list of these instructions which I personally wrote for this pt and  are unique to this visit.

## 2017-03-12 NOTE — Assessment & Plan Note (Signed)
This has responded to conservative rx this time but will continue to be a problem and eventually decision will need to be made re trach/peg but this decision will be difficult judgement call and should be based on life expectancy / quality of life issues best addressed at the time by those closest to the pt with PCP input as well - I suspect PCCM service will be involved at that point as well but nothing else to offer at this point from pulmonary clinic perspective.

## 2017-03-12 NOTE — Assessment & Plan Note (Signed)
Secondary to goiter with no audible upper airway wheeze so this is not physiologically relevant which is typically the case with substernal thyroidomegaly

## 2017-03-12 NOTE — Progress Notes (Signed)
LMTCB

## 2017-03-15 DIAGNOSIS — A419 Sepsis, unspecified organism: Secondary | ICD-10-CM | POA: Diagnosis not present

## 2017-03-15 DIAGNOSIS — J189 Pneumonia, unspecified organism: Secondary | ICD-10-CM | POA: Diagnosis not present

## 2017-03-16 NOTE — Progress Notes (Signed)
Spoke with pt and notified of results per Dr. Wert. Pt verbalized understanding and denied any questions. 

## 2017-03-17 DIAGNOSIS — A419 Sepsis, unspecified organism: Secondary | ICD-10-CM | POA: Diagnosis not present

## 2017-03-17 DIAGNOSIS — J189 Pneumonia, unspecified organism: Secondary | ICD-10-CM | POA: Diagnosis not present

## 2017-03-18 ENCOUNTER — Ambulatory Visit (INDEPENDENT_AMBULATORY_CARE_PROVIDER_SITE_OTHER): Payer: Medicare Other | Admitting: General Practice

## 2017-03-18 DIAGNOSIS — J189 Pneumonia, unspecified organism: Secondary | ICD-10-CM | POA: Diagnosis not present

## 2017-03-18 DIAGNOSIS — Z7901 Long term (current) use of anticoagulants: Secondary | ICD-10-CM

## 2017-03-18 DIAGNOSIS — A419 Sepsis, unspecified organism: Secondary | ICD-10-CM | POA: Diagnosis not present

## 2017-03-18 LAB — POCT INR: INR: 6

## 2017-03-18 NOTE — Patient Instructions (Addendum)
Pre visit review using our clinic review tool, if applicable. No additional management support is needed unless otherwise documented below in the visit note.  Hold dosage through Sunday and Re-check on Monday.  Change dosage and take 2 mg daily.   Dosing instructions given to Buzz, RN @ [facility] while in patient's home.

## 2017-03-20 ENCOUNTER — Other Ambulatory Visit: Payer: Self-pay | Admitting: Endocrinology

## 2017-03-20 NOTE — Telephone Encounter (Signed)
Please refill x 1 Ov is due  

## 2017-03-22 ENCOUNTER — Ambulatory Visit (INDEPENDENT_AMBULATORY_CARE_PROVIDER_SITE_OTHER): Payer: Medicare Other | Admitting: General Practice

## 2017-03-22 DIAGNOSIS — J189 Pneumonia, unspecified organism: Secondary | ICD-10-CM | POA: Diagnosis not present

## 2017-03-22 DIAGNOSIS — Z7901 Long term (current) use of anticoagulants: Secondary | ICD-10-CM

## 2017-03-22 DIAGNOSIS — A419 Sepsis, unspecified organism: Secondary | ICD-10-CM | POA: Diagnosis not present

## 2017-03-22 LAB — POCT INR: INR: 3.1

## 2017-03-22 NOTE — Patient Instructions (Signed)
Pre visit review using our clinic review tool, if applicable. No additional management support is needed unless otherwise documented below in the visit note.  Hold coumadin today and then take 2 mg daily.  Re-check in 1 week.  Dosing instructions given to Buzz, RN @ [facility] while in patient's home.

## 2017-03-23 ENCOUNTER — Ambulatory Visit: Payer: Medicare Other | Admitting: Podiatry

## 2017-03-25 DIAGNOSIS — J189 Pneumonia, unspecified organism: Secondary | ICD-10-CM | POA: Diagnosis not present

## 2017-03-25 DIAGNOSIS — A419 Sepsis, unspecified organism: Secondary | ICD-10-CM | POA: Diagnosis not present

## 2017-03-28 ENCOUNTER — Other Ambulatory Visit: Payer: Self-pay | Admitting: Internal Medicine

## 2017-03-29 ENCOUNTER — Ambulatory Visit (INDEPENDENT_AMBULATORY_CARE_PROVIDER_SITE_OTHER): Payer: Medicare Other | Admitting: General Practice

## 2017-03-29 ENCOUNTER — Telehealth: Payer: Self-pay | Admitting: Internal Medicine

## 2017-03-29 DIAGNOSIS — A419 Sepsis, unspecified organism: Secondary | ICD-10-CM | POA: Diagnosis not present

## 2017-03-29 DIAGNOSIS — Z7901 Long term (current) use of anticoagulants: Secondary | ICD-10-CM | POA: Diagnosis not present

## 2017-03-29 DIAGNOSIS — J189 Pneumonia, unspecified organism: Secondary | ICD-10-CM | POA: Diagnosis not present

## 2017-03-29 LAB — POCT INR: INR: 1.7

## 2017-03-29 NOTE — Patient Instructions (Signed)
Pre visit review using our clinic review tool, if applicable. No additional management support is needed unless otherwise documented below in the visit note.  Please take 4 mg today then change dosage and take 2 mg daily except 4 mg on Mondays and Fridays.Dosing instructions given to Buzz, RN @ [facility] while in patient's home. [phone].  Re-check in 1 week.

## 2017-03-29 NOTE — Telephone Encounter (Signed)
Copied from [place] [phone]. Topic: Quick Communication - See Telephone Encounter >> Mar 29, 2017  4:39 PM Arletha Grippe wrote: CRM for notification. See Telephone encounter for: 03/29/17.]daughter teresa Ringley calling asking for call back from cindy - coumadin clinic Number is [phone]

## 2017-03-30 NOTE — Telephone Encounter (Signed)
Noted. Spoke with patient's daughter and reiterate dosing instructions for coumadin.  Dtr verbalized understanding.

## 2017-03-31 DIAGNOSIS — J449 Chronic obstructive pulmonary disease, unspecified: Secondary | ICD-10-CM | POA: Diagnosis not present

## 2017-03-31 DIAGNOSIS — R0902 Hypoxemia: Secondary | ICD-10-CM | POA: Diagnosis not present

## 2017-04-01 ENCOUNTER — Encounter: Payer: Self-pay | Admitting: Internal Medicine

## 2017-04-01 NOTE — Telephone Encounter (Signed)
Please advise on the note below. Thank you!

## 2017-04-01 NOTE — Telephone Encounter (Signed)
Copied from [place]. Topic: General - Other >> Apr 01, 2017  9:17 AM Carolyn Stare wrote:  Pt daughter [name] call to to ask if it will be ok for pt to have cleaning, dental work at the dentist office. Daughter would like a letter stating this is ok . [phone]

## 2017-04-01 NOTE — Telephone Encounter (Signed)
Yes, ok to have dental work.  Ok to write letter

## 2017-04-02 ENCOUNTER — Encounter: Payer: Self-pay | Admitting: Emergency Medicine

## 2017-04-02 DIAGNOSIS — A419 Sepsis, unspecified organism: Secondary | ICD-10-CM | POA: Diagnosis not present

## 2017-04-02 DIAGNOSIS — J189 Pneumonia, unspecified organism: Secondary | ICD-10-CM | POA: Diagnosis not present

## 2017-04-02 NOTE — Telephone Encounter (Signed)
Letter written, spoke with daughter she asked that we mail the letter.

## 2017-04-05 ENCOUNTER — Ambulatory Visit (INDEPENDENT_AMBULATORY_CARE_PROVIDER_SITE_OTHER): Payer: Medicare Other | Admitting: General Practice

## 2017-04-05 DIAGNOSIS — J189 Pneumonia, unspecified organism: Secondary | ICD-10-CM | POA: Diagnosis not present

## 2017-04-05 DIAGNOSIS — Z7901 Long term (current) use of anticoagulants: Secondary | ICD-10-CM | POA: Diagnosis not present

## 2017-04-05 DIAGNOSIS — A419 Sepsis, unspecified organism: Secondary | ICD-10-CM | POA: Diagnosis not present

## 2017-04-05 LAB — POCT INR: INR: 2.2

## 2017-04-05 NOTE — Patient Instructions (Addendum)
Pre visit review using our clinic review tool, if applicable. No additional management support is needed unless otherwise documented below in the visit note.  Continue to take 2 mg daily except 4 mg on Mondays and Fridays. Dosing instructions given to Buzz, RN @ [facility] while in patient's home. [phone].  Re-check in 2 weeks.

## 2017-04-08 ENCOUNTER — Telehealth: Payer: Self-pay | Admitting: Internal Medicine

## 2017-04-08 DIAGNOSIS — J9611 Chronic respiratory failure with hypoxia: Secondary | ICD-10-CM

## 2017-04-08 NOTE — Telephone Encounter (Signed)
She's technically right but has mutilple providers who can monitor her 02 needs including her pcp.  If she would like me to monitor it I'm happy to see in 3 months and reassess/ discuss options as to whether long term is necessary and how much

## 2017-04-08 NOTE — Telephone Encounter (Signed)
lmtcb for pt.  

## 2017-04-08 NOTE — Telephone Encounter (Signed)
Per MW- ONO on RA was pos for desat (test done by [facility] on 03/31/17) Needs to use her 2lpm with sleep and have ONO repeated on the 2LPM  Spoke with the pt's daughter [name] and notified her of this  She wants to discuss how long pt will need o2 for  She is asking when pt will need f/u  She does not think it should just be as needed since she still needs the o2  Please advise thanks!

## 2017-04-09 NOTE — Telephone Encounter (Signed)
Called and spoke with patient's daughter regarding O2 questions Advised her of MW recommendations She advised to schedule appt in 2 weeks for further accessing with MW Nothing further needed

## 2017-04-12 ENCOUNTER — Telehealth: Payer: Self-pay | Admitting: Hematology and Oncology

## 2017-04-12 ENCOUNTER — Ambulatory Visit: Payer: Medicare Other | Admitting: Hematology and Oncology

## 2017-04-12 ENCOUNTER — Other Ambulatory Visit: Payer: Medicare Other

## 2017-04-12 NOTE — Telephone Encounter (Signed)
Patient called to reschedule  °

## 2017-04-13 ENCOUNTER — Encounter: Payer: Self-pay | Admitting: Podiatry

## 2017-04-13 ENCOUNTER — Ambulatory Visit (INDEPENDENT_AMBULATORY_CARE_PROVIDER_SITE_OTHER): Payer: Medicare Other | Admitting: Podiatry

## 2017-04-13 DIAGNOSIS — M79674 Pain in right toe(s): Secondary | ICD-10-CM | POA: Diagnosis not present

## 2017-04-13 DIAGNOSIS — D689 Coagulation defect, unspecified: Secondary | ICD-10-CM

## 2017-04-13 DIAGNOSIS — M79675 Pain in left toe(s): Secondary | ICD-10-CM | POA: Diagnosis not present

## 2017-04-13 DIAGNOSIS — B351 Tinea unguium: Secondary | ICD-10-CM | POA: Diagnosis not present

## 2017-04-13 NOTE — Progress Notes (Signed)
Complaint:  Visit Type: Patient returns to my office for continued preventative foot care services.  She is accompanied by her daughter.   Complaint: Patient daughter says her mothers nails  have grown long and thick and become painful  Wearing shoes. shoes" Patient has been diagnosed with CVA.  . The patient presents for preventative foot care services. No changes to ROS.  Patient is taking coumadin.  Podiatric Exam: Vascular: dorsalis pedis and posterior tibial pulses are palpable bilateral. Capillary return is immediate. Temperature gradient is WNL. Skin turgor WNL  Sensorium: Deferred since patient is unable to communicate. Nail Exam: Pt has thick disfigured discolored nails with subungual debris noted bilateral entire nail hallux through fifth toenails Ulcer Exam: There is no evidence of ulcer or pre-ulcerative changes or infection. Orthopedic Exam: Muscle tone and strength are WNL. No limitations in general ROM. No crepitus or effusions noted. Foot type and digits show no abnormalities. Bony prominences are unremarkable. Skin: No Porokeratosis. No infection or ulcers  Diagnosis:  Onychomycosis, , Pain in right toe, pain in left toes  Treatment & Plan Procedures and Treatment: Consent by patient was obtained for treatment procedures.   Debridement of mycotic and hypertrophic toenails, 1 through 5 bilateral and clearing of subungual debris. No ulceration, no infection noted. ABN signed for 2019. Return Visit-Office Procedure: Patient instructed to return to the office for a follow up visit 3 months for continued evaluation and treatment.    Jennilee Demarco DPM 

## 2017-04-14 ENCOUNTER — Ambulatory Visit (INDEPENDENT_AMBULATORY_CARE_PROVIDER_SITE_OTHER): Payer: Medicare Other | Admitting: Internal Medicine

## 2017-04-14 ENCOUNTER — Encounter: Payer: Self-pay | Admitting: Internal Medicine

## 2017-04-14 VITALS — BP 112/80 | HR 80

## 2017-04-14 DIAGNOSIS — J9611 Chronic respiratory failure with hypoxia: Secondary | ICD-10-CM | POA: Diagnosis not present

## 2017-04-14 NOTE — Progress Notes (Signed)
Subjective:     Patient ID: Sandy Barrett, female   DOB: [birth year],    MRN: [id number]     Brief patient profile:  78 yobf never smoker with hbp/ thrombocytosis  > L Hem cva with R hemiparesis [place] in 2016 and living at her home since then daughter with pneumonia 2018 then feb 2019  Attributed to aspiration > DII diet and doing better since last admit:    Admit date: 02/12/2017 Discharge date: 02/17/2017   Discharge Diagnoses:      Active Hospital Problems   Diagnosis Date Noted  . Sepsis ([place]) 02/13/2017  . Pneumonia 02/12/2017  . HCAP (healthcare-associated pneumonia) 02/12/2017    Resolved Hospital Problems  No resolved problems to display.    Discharge Condition: stable  Diet recommendation: heart healthy  Thin liquid;Dysphagia 2 (Fine chop)  Liquid Administration via: Cup;Straw Medication Administration: Crushed with puree Supervision: Full supervision/cueing for compensatory strategies;Patient able to self feed Compensations: Minimize environmental distractions;Slow rate;Small sips/bites;Follow solids with liquid Postural Changes: Seated upright at 90 degrees;Remain upright for at least 30 minutes after po intake(daughter requests pt be seated in chair for meals, if at all possible)    Hospital Course:  Active Problems:   Pneumonia   HCAP (healthcare-associated pneumonia)   Sepsis ([place])   Recurrent aspiration pneumonia/acute hypoxic respiratory failure(suspect underlying chronic hypoxia) -She was hypoxic with saturations on room air in the 70s per EMS reports on presentation.  -flu negative, blood culture no growth, cough is not productive -Chest x-ray from admission   "Patchy consolidation both lung bases. A degree of bibasilar pneumonia must be of concern. Small left pleural effusion" - she received IV cefepime and vancomycin since admission, clinically improving, mrsa screen negative, change abx to unasyn to cover aspiration  pneumonia, continue mucinex,  -discharged on augmentin.   -repeat swallow eval on 2/11 due to choking episode of dysphagia 3 diet on 2/11 Diet modified per repeat swallow eval: Thin liquid;Dysphagia 2 (Fine chop)  Liquid Administration via: Cup;Straw Medication Administration: Crushed with puree Supervision: Full supervision/cueing for compensatory strategies;Patient able to self feed Compensations: Minimize environmental distractions;Slow rate;Small sips/bites;Follow solids with liquid Postural Changes: Seated upright at 90 degrees;Remain upright for at least 30 minutes after po intake(daughter requests pt be seated in chair for meals, if at all possible)  patient has multiple risks of aspiration, with h/o cva, bed to wheel chair bound, generalized weakness, aphasia, large goiter with deviated trachea   -Daughter wants to try diet modification, behavioral maneuver, before considering feeding tube placement. She also mentioned to follow with endocrinology for goiter treatment. She is hopeful that her goiter will shrink and that will also help the patient swallow.  Small left pleural effusion: She received iv lasix in the hospital. pmd to follow up.  Hypokalemia/hypomagnesemia: K/ Mag normalized  elevated troponin:Mild 0.1 , no chest pain . probably as a result of pneumonia.  Echocardiogram reviewed with no significant abnormalities cardiology initially consulted, signed off.  Tele personally reviewed, unremarkable, will d/c tele today.  History of PE: Continue warfarin per pharmacy. INR today is2.05  Essential thrombocytosis:Patient on hydroxyurea. Continue according to hematology Platelet 455.   Hypertension:Continue blood pressure control with Propranolol, lasix  history of CVA: Resided right hemiplegia , aphasia, has been bed to wheelchair bound since 2016 .at baseline-requires hoyer lift for transfers. she has 24/7 care at home.  Right arm  edema,chronic /intermittent per daughter negative DVT from recent venous Doppler. Been on Coumadin likely dependent edema, elevate arm  lymphadema treatment on outpatient basis   hyperparathyroidism: Marked thyroid enlargement with marked deviation of the thoracic trachea toward the right with thoracic tracheal narrowing, stable on x ray. Patient on Tapazole. We'll continue treatment   GERD:Continue PPIs  Recently diagnosed with cholecystitis (hospitalized from2/3-2/6 ), felt not a candidate for surgery or gallbaldder drain.  Patient does not have n/v this hospitalization, denies abdominal pain, lft wnl.    03/11/2017 1st [facility] office visit/ Durrell Barajas   Chief Complaint  Patient presents with  . Pulmonary Consult    Referred by Dr. Quay Burow.  Pt hospitalized with PNA Feb 2019 and sent home with supplemental o2 24/7 @ 2lpm.    remains w/c bound, hoyer dep but able to sleep with about 30 degrees elevation / no vomiting or obvious aspiration since last admit living at home with her daughter on 2lpm 24/7  rec Ok to leave off 02 at rest  Please see patient coordinator before you leave today  to schedule overnight oximetry room air Please remember to go to the  x-ray department downstairs in the basement  for your tests - we will call you with the results when they are available. Pulmonary follow up is as needed as there are no medications or interventions that will be helpful at this point other than paying close attention to swallowing/ diet restrictions -    04/14/2017  f/u ov/Adarian Bur re: chronic resp failure p pna/ ? Asp  Chief Complaint  Patient presents with  . Follow-up    Wants to discuss need for nocturnal o2.   Dyspnea:  howyer lift for w/c to bed Cough: none at present  Sleep: 30 degrees/ 2lpm / hosp bed  SABA use:  None  Swallowing ok /feeding herself s cough p eat per daughter   No obvious day to day or daytime variability or assoc excess/ purulent sputum or  mucus plugs or hemoptysis or cp or chest tightness, subjective wheeze or overt sinus or hb symptoms. No unusual exposure hx or h/o childhood pna/ asthma or knowledge of premature birth.  Sleeping  30 degrees/ 2lpm / hosp bed   without nocturnal  or early am exacerbation  of respiratory  c/o's or need for noct saba. Also denies any obvious fluctuation of symptoms with weather or environmental changes or other aggravating or alleviating factors except as outlined above   Current Allergies, Complete Past Medical History, Past Surgical History, Family History, and Social History were reviewed in [employer] record.  ROS  The following are not active complaints unless bolded Hoarseness, sore throat, dysphagia, dental problems, itching, sneezing,  nasal congestion or discharge of excess mucus or purulent secretions, ear ache,   fever, chills, sweats, unintended wt loss or wt gain, classically pleuritic or exertional cp,  orthopnea pnd or arm/hand swelling  or leg swelling, presyncope, palpitations, abdominal pain, anorexia, nausea, vomiting, diarrhea  or change in bowel habits or change in bladder habits, change in stools or change in urine, dysuria, hematuria,  rash, arthralgias, visual complaints, headache, numbness, weakness or ataxia or problems with walking or coordination,  change in mood or  memory.        Current Meds  Medication Sig  . acetaminophen (TYLENOL) 160 MG/5ML liquid Take 480 mg by mouth every 4 (four) hours as needed for fever or pain.  . calamine lotion Apply 1 application as needed topically for itching.  . diclofenac sodium (VOLTAREN) 1 % GEL APPLY (2GMS) TOPICALLY THREE TIMES DAILY.  [name] guaiFENesin (  MUCINEX) 600 MG 12 hr tablet Take 600 mg by mouth 2 (two) times daily.  [name] guaiFENesin (ROBITUSSIN) 100 MG/5ML SOLN Take 5 mLs (100 mg total) by mouth 2 (two) times daily.  . hydroxyurea (HYDREA) 500 MG capsule Take 1 tablet (500 mg) Monday through Friday, take 2  tablets ( 1000 mg ) Saturday and Sunday  . ipratropium-albuterol (DUONEB) 0.5-2.5 (3) MG/3ML SOLN Take 3 mLs by nebulization every 6 (six) hours as needed. (Patient taking differently: Take 3 mLs by nebulization every 6 (six) hours as needed (shortness of breath). )  . lidocaine (LIDODERM) 5 % Place 1 patch daily onto the skin. Remove & Discard patch within 12 hours or as directed by MD (Patient taking differently: Place 1 patch onto the skin daily as needed (pain). Remove & Discard patch within 12 hours or as directed by MD)  . methimazole (TAPAZOLE) 5 MG tablet TAKE 1 TABLET BY MOUTH TWICE WEEKLY  . Oral Hygiene Products (Q-CARE COVERD YANKAUER/SUCTION) MISC Use as directed  . OXYGEN 2lpm 24/7  DME- [facility]  . polyethylene glycol (MIRALAX / GLYCOLAX) packet TAKE 17G BY MOUTH TWICE A DAY (Patient taking differently: TAKE 17G BY MOUTH TWICE A DAY AS NEED FOR CONSTIPATION)  . pregabalin (LYRICA) 25 MG capsule Take 1 capsule (25 mg total) by mouth 2 (two) times daily.  . Probiotic Product (ALIGN) 4 MG CAPS Take 4 mg by mouth daily.   . propranolol (INDERAL) 10 MG tablet Take 0.5 tablets (5 mg total) by mouth 2 (two) times daily.  [name] PROTONIX 40 MG PACK TAKE 20 MLS (40 MG TOTAL) BY MOUTH DAILY.  [name] terconazole (TERAZOL 7) 0.4 % vaginal cream Place 1 applicator vaginally at bedtime. (Patient taking differently: Place 1 applicator vaginally at bedtime as needed (irritation). )  . traMADol (ULTRAM) 50 MG tablet TAKE 2 TABLETS BY MOUTH 3 TIMES A DAY AS NEEDED  . traZODone (DESYREL) 50 MG tablet TAKE 1 TABLET (50 MG TOTAL) BY MOUTH AT BEDTIME AS NEEDED. FOR SLEEP  . triamcinolone ointment (KENALOG) 0.5 % Apply 1 application topically 2 (two) times daily.  [name] warfarin (COUMADIN) 2 MG tablet Take as directed by anticoagulation clinic. (Patient taking differently: Take 2 mg by mouth See admin instructions. Take 2 mg every tues,wed, thurs, sat, & sun)  . warfarin (COUMADIN) 4 MG tablet TAKE AS DIRECTED BY  ANTICOAGULATION CLINIC. (Patient taking differently: Take 4 mg by mouth every Friday and  Monday)          Objective:   Physical Exam W/c bound with dense expressive/ receptive aphasia  Wt Readings from Last 3 Encounters:  02/23/17 176 lb (79.8 kg)  02/13/17 169 lb 5 oz (76.8 kg)  02/10/17 172 lb 9.9 oz (78.3 kg)      Vital signs reviewed - Note on arrival 02 sats  94% on RA      HEENT: nl     oropharynx. Nl external ear canals without cough reflex - mild  bilateral non-specific turbinate edema  / upper dentures    NECK :  without JVD/Nodes/TM/ nl carotid upstrokes bilaterally   LUNGS: no acc muscle use,  Nl contour chest with diminished bs in bases/ no rhonchi or wheezing   CV:  RRR  no s3 or murmur or increase in P2, and no edema   ABD:  soft and nontender with nl inspiratory excursion in the supine position. No bruits or organomegaly appreciated, bowel sounds nl  MS:    ext warm without deformities, calf  tenderness, cyanosis or clubbing No obvious joint restrictions   SKIN: warm and dry without lesions    NEURO:  alert, approp, nl sensorium with  no motor or cerebellar deficits apparent.         CXR PA and Lateral:   03/11/2017 :    I personally reviewed images and  impression as follows:    severe chronic R tracheal dev but no obst/ nl lung fields          Assessment:

## 2017-04-14 NOTE — Patient Instructions (Addendum)
Continue 02 2lpm as much as possible at bedtime only   Call if questions or issues - the key is to stay out of bed as much as you can and be very careful with swallowing food /liquids as per you dietary /speech therapy restrictions

## 2017-04-15 ENCOUNTER — Encounter: Payer: Self-pay | Admitting: Internal Medicine

## 2017-04-15 NOTE — Assessment & Plan Note (Addendum)
sats 97% RA at rest 03/11/2017 p admit for asp pna 02/12/17  - ONO rec RA  03/31/17  desat < 89% x 10h 39 min rec 2lpm hs and repeat ono on 2lpm > not done   I had an extended discussion with the patient/daughter  reviewing all relevant studies completed to date and  lasting 15 to 20 minutes of a 25 minute visit on the following ongoing concerns:   1) daughter concerned her mother pulls 02 off many nocts and the ONO not reliable but seems fine on 2lpm and wants to know what to look for if sats not adequate other than obvious air hunger:  Advised HA and ams/leg swelling are potential issues  2) began discussion geared toward approp expectations in terms of what medical science can do in pts who are debilitated, limited in what we can ask them to do   ie wear 02 reliably, swallow consistently correctly, cough effectively and communicate effectively when then feel bad.  When I mentioned restraints to prevent her pulling off 02 as an example the daughter seemed taken aback but this was as an example of the limits of what we can offer,  Not as a suggestion that this be done (same with trach/ PEG/ vent would apply)   No need for pulmonary f/u in this setting nor escalation of care other than continue swallowing issues

## 2017-04-16 ENCOUNTER — Ambulatory Visit (INDEPENDENT_AMBULATORY_CARE_PROVIDER_SITE_OTHER): Payer: Medicare Other | Admitting: General Practice

## 2017-04-16 DIAGNOSIS — I825Y3 Chronic embolism and thrombosis of unspecified deep veins of proximal lower extremity, bilateral: Secondary | ICD-10-CM

## 2017-04-16 DIAGNOSIS — J189 Pneumonia, unspecified organism: Secondary | ICD-10-CM | POA: Diagnosis not present

## 2017-04-16 DIAGNOSIS — A419 Sepsis, unspecified organism: Secondary | ICD-10-CM | POA: Diagnosis not present

## 2017-04-16 DIAGNOSIS — Z7901 Long term (current) use of anticoagulants: Secondary | ICD-10-CM

## 2017-04-16 LAB — POCT INR: INR: 2.8

## 2017-04-16 NOTE — Patient Instructions (Addendum)
Pre visit review using our clinic review tool, if applicable. No additional management support is needed unless otherwise documented below in the visit note.  Continue to take 2 mg daily except 4 mg on Mondays and Fridays. Pt being discharged from home health today.  Re-check in 4 weeks.  Dosing instructions given to [name], pt's daughter and she verbalized understanding.  Appointment made to check pt in clinic.

## 2017-04-17 NOTE — Progress Notes (Signed)
Agree with management.  Valory Wetherby J Jameisha Stofko, MD  

## 2017-04-20 ENCOUNTER — Encounter: Payer: Self-pay | Admitting: Physical Medicine & Rehabilitation

## 2017-04-20 ENCOUNTER — Ambulatory Visit (HOSPITAL_BASED_OUTPATIENT_CLINIC_OR_DEPARTMENT_OTHER): Payer: Medicare Other | Admitting: Physical Medicine & Rehabilitation

## 2017-04-20 ENCOUNTER — Encounter: Payer: Medicare Other | Attending: Physical Medicine & Rehabilitation

## 2017-04-20 VITALS — BP 150/85 | HR 78 | Ht 60.0 in | Wt 176.0 lb

## 2017-04-20 DIAGNOSIS — G8111 Spastic hemiplegia affecting right dominant side: Secondary | ICD-10-CM

## 2017-04-20 DIAGNOSIS — G8110 Spastic hemiplegia affecting unspecified side: Secondary | ICD-10-CM | POA: Insufficient documentation

## 2017-04-20 MED ORDER — PREGABALIN 25 MG PO CAPS: 25.0000 mg | ORAL_CAPSULE | Freq: Three times a day (TID) | ORAL | 2 refills | Status: DC

## 2017-04-20 NOTE — Progress Notes (Signed)
Dysport Injection for spasticity using needle EMG guidance  Dilution: 200 Units/ml Indication: Severe spasticity which interferes with ADL,mobility and/or  hygiene and is unresponsive to medication management and other conservative care Informed consent was obtained after describing risks and benefits of the procedure with the patient. This includes bleeding, bruising, infection, excessive weakness, or medication side effects. A REMS form is on file and signed. Needle:  needle electrode Number of units per muscle PQ 100U  FPL 200U All injections were done after obtaining appropriate EMG activity Minimal activity at PQ , moderate in Right FPL and after negative drawback for blood. The patient tolerated the procedure well. Post procedure instructions were given. A followup appointment was made.

## 2017-04-22 ENCOUNTER — Other Ambulatory Visit: Payer: Self-pay | Admitting: Internal Medicine

## 2017-04-22 NOTE — Telephone Encounter (Signed)
[place] Controlled Substance Database checked. Last filled on 03/19/17

## 2017-04-23 DIAGNOSIS — J449 Chronic obstructive pulmonary disease, unspecified: Secondary | ICD-10-CM | POA: Diagnosis not present

## 2017-04-23 DIAGNOSIS — R0902 Hypoxemia: Secondary | ICD-10-CM | POA: Diagnosis not present

## 2017-04-24 ENCOUNTER — Encounter: Payer: Self-pay | Admitting: Internal Medicine

## 2017-04-28 ENCOUNTER — Telehealth: Payer: Self-pay | Admitting: Internal Medicine

## 2017-04-28 NOTE — Telephone Encounter (Signed)
Per MW- ONO on 2lpm (done by [facility] on 04/23/17) was normal  Needs to continue using her 2lpm o2 with sleep   LMTCB for [name], the pt's daughter

## 2017-04-28 NOTE — Telephone Encounter (Signed)
Pt's daughter [name] returned call Spoke with [name] and discussed ONO results/recs as stated by MW [name] voiced her understanding and denied any further questions/concerns  [name] also asked for recommendations for pt's dry nose - advised saline nasal spray/gel Nothing further needed; will sign off

## 2017-05-06 ENCOUNTER — Telehealth: Payer: Self-pay | Admitting: Emergency Medicine

## 2017-05-06 NOTE — Telephone Encounter (Signed)
Received voicemail from pts daughter. States that pts right foot, which is her inactive foot, is swollen. They have tried elevation but do not see any changes. Does she needs to be seen. Please advise?

## 2017-05-06 NOTE — Telephone Encounter (Signed)
She is low risk for a blood clot because she is on a blood thinner, but given her history she should be seen and we still need to rule out a blood clot.  Ideally should be seen earlier in the morning so we can arrange an ultrasound.

## 2017-05-07 DIAGNOSIS — H6121 Impacted cerumen, right ear: Secondary | ICD-10-CM | POA: Diagnosis not present

## 2017-05-07 NOTE — Telephone Encounter (Signed)
Spoke with pts daughter.. She states pt stayed in bed and elevated foot yesterday and the swelling has seemed to go down some. Pt has appt on Tuesday to see [place]. Advised that if things got worse over the weekend to call EMS. Daughter verbalized understanding and knew what to look for for potential blood clot.

## 2017-05-10 ENCOUNTER — Inpatient Hospital Stay (HOSPITAL_BASED_OUTPATIENT_CLINIC_OR_DEPARTMENT_OTHER): Payer: Medicare Other | Admitting: Hematology and Oncology

## 2017-05-10 ENCOUNTER — Inpatient Hospital Stay: Payer: Medicare Other | Attending: Hematology and Oncology

## 2017-05-10 ENCOUNTER — Telehealth: Payer: Self-pay | Admitting: Hematology and Oncology

## 2017-05-10 ENCOUNTER — Encounter: Payer: Self-pay | Admitting: Hematology and Oncology

## 2017-05-10 DIAGNOSIS — G8110 Spastic hemiplegia affecting unspecified side: Secondary | ICD-10-CM

## 2017-05-10 DIAGNOSIS — D473 Essential (hemorrhagic) thrombocythemia: Secondary | ICD-10-CM | POA: Diagnosis not present

## 2017-05-10 LAB — CBC WITH DIFFERENTIAL/PLATELET
Basophils Absolute: 0.1 10*3/uL (ref 0.0–0.1)
Basophils Relative: 1 %
Eosinophils Absolute: 0.1 10*3/uL (ref 0.0–0.5)
Eosinophils Relative: 2 %
HCT: 40.8 % (ref 34.8–46.6)
Hemoglobin: 12.9 g/dL (ref 11.6–15.9)
Lymphocytes Relative: 19 %
Lymphs Abs: 1.1 10*3/uL (ref 0.9–3.3)
MCH: 37.2 pg — ABNORMAL HIGH (ref 25.1–34.0)
MCHC: 31.6 g/dL (ref 31.5–36.0)
MCV: 117.6 fL — ABNORMAL HIGH (ref 79.5–101.0)
Monocytes Absolute: 0.3 10*3/uL (ref 0.1–0.9)
Monocytes Relative: 5 %
Neutro Abs: 4.5 10*3/uL (ref 1.5–6.5)
Neutrophils Relative %: 73 %
Platelets: 422 10*3/uL — ABNORMAL HIGH (ref 145–400)
RBC: 3.47 MIL/uL — ABNORMAL LOW (ref 3.70–5.45)
RDW: 16.6 % — ABNORMAL HIGH (ref 11.2–14.5)
WBC: 6.1 10*3/uL (ref 3.9–10.3)

## 2017-05-10 NOTE — Assessment & Plan Note (Signed)
She have no further recurrence of stroke She will continue hydroxyurea and anticoagulation therapy indefinitely

## 2017-05-10 NOTE — Telephone Encounter (Signed)
Gave patient AVs and calendar of upcoming November appointments.  °

## 2017-05-10 NOTE — Progress Notes (Signed)
[facility] OFFICE PROGRESS NOTE  Patient Care Team: [name], MD as PCP - General (Internal Medicine) Eunice Blase, MD (Inactive) as Consulting Physician (Family Medicine) Renato Shin, MD as Consulting Physician (Endocrinology) Netta Cedars, MD as Consulting Physician (Orthopedic Surgery) Lyndal Pulley, DO (Sports Medicine) Garvin Fila, MD (Neurology)  ASSESSMENT & PLAN:  Essential thrombocytosis [facility]) She tolerated hydroxyurea well. She will take 500 mg daily from Monday to Fridays and 1000 mg on Saturdays and Sundays Her blood counts are stable.  I recommend we continue the same.  Spastic hemiparesis affecting dominant side (HCC), right She have no further recurrence of stroke She will continue hydroxyurea and anticoagulation therapy indefinitely   No orders of the defined types were placed in this encounter.   INTERVAL HISTORY: Please see below for problem oriented charting. She returns with her daughter, Camilla for further follow-up. Due to her dysarthria from stroke, further history is not possible According to her daughter, the patient is compliant taking her prescribed hydroxyurea without difficulties There were no reported recent bleeding or infection  SUMMARY OF ONCOLOGIC HISTORY:   Essential thrombocytosis (HCC)   08/07/2013 Miscellaneous    The patient is noted to have elevated platelet count      03/08/2014 Imaging    Positive for acute PE with CT evidence of right heart strain (RV/LV Ratio = 0.9) consistent with at least submassive (intermediate risk)PE.       03/09/2014 Imaging    US venous Doppler showed deep vein thrombosis noted in the right distal common femoral vein, femoral vein, and popliteal vein. DVT noted in the left femoral and popliteal veins      03/09/2014 Imaging    Patchy areas of acute left MCA territory infarction. 2. Several punctate foci of acute right MCA territory infarction. Possible trace subarachnoid  hemorrhage in the high right frontal lobe. 3. Occluded left ICA and left MCA      03/20/2015 Imaging    Compared to MRI on 03/09/14, there has been expected evolutional change of left MCA infarction. In addition, there may be a few areas of acute-subacute infarcts in the left basal ganglia vs artifact.        07/31/2015 Pathology Results    Peripheral blood is positive for JAK2 mutation      07/31/2015 -  Chemotherapy    She is started on 500 mg daily Hydrea      08/15/2015 Miscellaneous    The dose of hydroxyurea is increased to 1000 mg daily      09/26/2015 Miscellaneous    Dose of Hydrea to 500 mg daily except on Saturdays and Sundays she takes 1000 mg.      10/25/2015 Adverse Reaction    Dose of Hydrea is reduced to 500 mg daily       10 /04/2016 Miscellaneous    From 2017 to 2018, the dose of Hydrea is further adjusted.       REVIEW OF SYSTEMS:  Unable to assess I have reviewed the past medical history, past surgical history, social history and family history with the patient and they are unchanged from previous note.  ALLERGIES:  is allergic to valtrex [valacyclovir hcl].  MEDICATIONS:  Current Outpatient Medications  Medication Sig Dispense Refill  . acetaminophen (TYLENOL) 160 MG/5ML liquid Take 480 mg by mouth every 4 (four) hours as needed for fever or pain.    . calamine lotion Apply 1 application as needed topically for itching. 120 mL 0  .  diclofenac sodium (VOLTAREN) 1 % GEL APPLY (2GMS) TOPICALLY THREE TIMES DAILY. 300 g 5  . guaiFENesin (MUCINEX) 600 MG 12 hr tablet Take 600 mg by mouth 2 (two) times daily.    [name] guaiFENesin (ROBITUSSIN) 100 MG/5ML SOLN Take 5 mLs (100 mg total) by mouth 2 (two) times daily. 1200 mL 0  . hydroxyurea (HYDREA) 500 MG capsule Take 1 tablet (500 mg) Monday through Friday, take 2 tablets ( 1000 mg ) Saturday and Sunday 90 capsule 3  . ipratropium-albuterol (DUONEB) 0.5-2.5 (3) MG/3ML SOLN Take 3 mLs by nebulization every 6 (six) hours  as needed. (Patient taking differently: Take 3 mLs by nebulization every 6 (six) hours as needed (shortness of breath). ) 360 mL 0  . lidocaine (LIDODERM) 5 % Place 1 patch daily onto the skin. Remove & Discard patch within 12 hours or as directed by MD (Patient taking differently: Place 1 patch onto the skin daily as needed (pain). Remove & Discard patch within 12 hours or as directed by MD) 15 patch 0  . methimazole (TAPAZOLE) 5 MG tablet TAKE 1 TABLET BY MOUTH TWICE WEEKLY 12 tablet 1  . Oral Hygiene Products (Q-CARE COVERD YANKAUER/SUCTION) MISC Use as directed 10 each 5  . OXYGEN 2lpm 24/7  DME- [facility]    . polyethylene glycol (MIRALAX / GLYCOLAX) packet TAKE 17G BY MOUTH TWICE A DAY (Patient taking differently: TAKE 17G BY MOUTH TWICE A DAY AS NEED FOR CONSTIPATION) 14 packet 1  . pregabalin (LYRICA) 25 MG capsule Take 1 capsule (25 mg total) by mouth 3 (three) times daily. 90 capsule 2  . Probiotic Product (ALIGN) 4 MG CAPS Take 4 mg by mouth daily.     . propranolol (INDERAL) 10 MG tablet Take 0.5 tablets (5 mg total) by mouth 2 (two) times daily. 60 tablet 5  . PROTONIX 40 MG PACK TAKE 20 MLS (40 MG TOTAL) BY MOUTH DAILY. 40 mL 5  . terconazole (TERAZOL 7) 0.4 % vaginal cream Place 1 applicator vaginally at bedtime. (Patient taking differently: Place 1 applicator vaginally at bedtime as needed (irritation). ) 90 g 3  . traMADol (ULTRAM) 50 MG tablet TAKE 2 TABLETS BY MOUTH 3 TIMES A DAY AS NEEDED 180 tablet 1  . traZODone (DESYREL) 50 MG tablet TAKE 1 TABLET (50 MG TOTAL) BY MOUTH AT BEDTIME AS NEEDED. FOR SLEEP 30 tablet 5  . triamcinolone ointment (KENALOG) 0.5 % Apply 1 application topically 2 (two) times daily. 30 g 0  . warfarin (COUMADIN) 2 MG tablet Take as directed by anticoagulation clinic. (Patient taking differently: Take 2 mg by mouth See admin instructions. Take 2 mg every tues,wed, thurs, sat, & sun) 30 tablet 3  . warfarin (COUMADIN) 4 MG tablet TAKE AS DIRECTED BY  ANTICOAGULATION CLINIC. (Patient taking differently: Take 4 mg by mouth every Friday and  Monday) 40 tablet 3   No current facility-administered medications for this visit.     PHYSICAL EXAMINATION: ECOG PERFORMANCE STATUS: 2 - Symptomatic, <50% confined to bed  Vitals:   05/10/17 1518  BP: 110/80  Pulse: 87  Resp: 17  Temp: 98.1 F (36.7 C)  SpO2: 98%   Filed Weights    GENERAL:alert, no distress and comfortable.  She is examined sitting on the wheelchair SKIN: She has distended veins on her chest wall, stable EYES: normal, Conjunctiva are pink and non-injected, sclera clear LUNGS: clear to auscultation and percussion with normal breathing effort HEART: regular rate & rhythm and no murmurs NEURO: alert &  oriented x 3 with dysarthria, with persistent right-sided deficit LABORATORY DATA:  I have reviewed the data as listed    Component Value Date/Time   NA 139 02/23/2017 1407   NA 142 10/06/2016 1352   K 3.9 02/23/2017 1407   K 4.1 10/06/2016 1352   CL 103 02/23/2017 1407   CO2 35 (H) 02/23/2017 1407   CO2 27 10/06/2016 1352   GLUCOSE 90 02/23/2017 1407   GLUCOSE 90 10/06/2016 1352   BUN 7 02/23/2017 1407   BUN 12.9 10/06/2016 1352   CREATININE 0.31 (L) 02/23/2017 1407   CREATININE 0.6 10/06/2016 1352   CALCIUM 9.1 02/23/2017 1407   CALCIUM 9.4 10/06/2016 1352   PROT 6.3 02/23/2017 1407   ALBUMIN 3.2 (L) 02/23/2017 1407   AST 10 02/23/2017 1407   ALT 8 02/23/2017 1407   ALKPHOS 70 02/23/2017 1407   BILITOT 0.8 02/23/2017 1407   GFRNONAA >60 02/17/2017 0450   GFRAA >60 02/17/2017 0450    No results found for: SPEP, UPEP  Lab Results  Component Value Date   WBC 6.1 05/10/2017   NEUTROABS 4.5 05/10/2017   HGB 12.9 05/10/2017   HCT 40.8 05/10/2017   MCV 117.6 (H) 05/10/2017   PLT 422 (H) 05/10/2017      Chemistry      Component Value Date/Time   NA 139 02/23/2017 1407   NA 142 10/06/2016 1352   K 3.9 02/23/2017 1407   K 4.1 10/06/2016 1352   CL 103  02/23/2017 1407   CO2 35 (H) 02/23/2017 1407   CO2 27 10/06/2016 1352   BUN 7 02/23/2017 1407   BUN 12.9 10/06/2016 1352   CREATININE 0.31 (L) 02/23/2017 1407   CREATININE 0.6 10/06/2016 1352      Component Value Date/Time   CALCIUM 9.1 02/23/2017 1407   CALCIUM 9.4 10/06/2016 1352   ALKPHOS 70 02/23/2017 1407   AST 10 02/23/2017 1407   ALT 8 02/23/2017 1407   BILITOT 0.8 02/23/2017 1407       All questions were answered. The patient knows to call the clinic with any problems, questions or concerns. No barriers to learning was detected.  I spent 10 minutes counseling the patient face to face. The total time spent in the appointment was 15 minutes and more than 50% was on counseling and review of test results  Heath Lark, MD 05/10/2017 3:42 PM

## 2017-05-10 NOTE — Assessment & Plan Note (Signed)
She tolerated hydroxyurea well. She will take 500 mg daily from Monday to Fridays and 1000 mg on Saturdays and Sundays Her blood counts are stable.  I recommend we continue the same.

## 2017-05-11 ENCOUNTER — Ambulatory Visit: Payer: Medicare Other | Admitting: Internal Medicine

## 2017-05-11 ENCOUNTER — Ambulatory Visit: Payer: Medicare Other

## 2017-05-11 ENCOUNTER — Telehealth: Payer: Self-pay | Admitting: Emergency Medicine

## 2017-05-11 DIAGNOSIS — L89159 Pressure ulcer of sacral region, unspecified stage: Secondary | ICD-10-CM

## 2017-05-11 MED ORDER — AMOXICILLIN-POT CLAVULANATE 250-62.5 MG/5ML PO SUSR: 850.0000 mg | Freq: Two times a day (BID) | ORAL | 0 refills | Status: DC

## 2017-05-11 NOTE — Telephone Encounter (Signed)
Antibiotic sent earlier. [facility] referral ordered

## 2017-05-11 NOTE — Telephone Encounter (Signed)
Received call from patients daughter, states patient is crying and seems to be confused, pointing to things in the room. Would like to know if we can put referral in for home Health for urine check and to access bed sores. Or should Urology be contacted.

## 2017-05-11 NOTE — Telephone Encounter (Signed)
Urology and home health will take a few days to figure out if there is an infection.  We either treat with an antibiotic assuming she has an infection or she takes her to the hospital.  I am ok with treating her if she does not think there is any other cause for the confusion.     Can do home health for bed sore if she wants.    Is she coming today  - just confirm.

## 2017-05-11 NOTE — Telephone Encounter (Signed)
Spoke with pts daughter. She feels that the only time to pt is confused is when she has a UTI. Would like RX sent to [facility]. Would like to continue with referral to [facility] for wounds.   Appt cancelled for today, she is unable to get pt out of bed. Will call back and reschedule once she feels pt is feeling better.

## 2017-05-11 NOTE — Progress Notes (Deleted)
Subjective:    Patient ID: Sandy Barrett, female    DOB: [birth year], 84 y.o.   MRN: [id number]  HPI The patient is here for follow up.  Hypertension:  History of stroke, aphasia, spastic hemiparesis of right side: She is taking her warfarin daily as prescribed.  Thrombocytosis: She is following with oncology and was just seen yesterday.  Goiter, hyperthyroidism: She is following with Dr. Cruzita Lederer.  Medications and allergies reviewed with patient and updated if appropriate.  Patient Active Problem List   Diagnosis Date Noted  . Cholecystitis 02/23/2017  . Chronic respiratory failure with hypoxia ([place]) 02/23/2017  . Sepsis ([place]) 02/13/2017  . Pneumonia 02/12/2017  . HCAP (healthcare-associated pneumonia) 02/12/2017  . Elevated troponin   . Nausea & vomiting 02/08/2017  . CAP (community acquired pneumonia) 02/08/2017  . SIRS (systemic inflammatory response syndrome) ([place]) 02/08/2017  . Neuralgia 01/09/2017  . Abnormal urine odor 10/28/2016  . Venous congestion 10/08/2016  . Lymphedema of breast 09/11/2016  . Long term (current) use of anticoagulants [Z79.01] 09/10/2016  . Cough 08/23/2016  . Left hip pain 08/09/2016  . Yeast infection of the skin 07/01/2016  . Abnormal stool color 05/18/2016  . Left leg swelling 05/18/2016  . Pressure injury of skin 04/27/2016  . Aspiration pneumonia ([place]) 04/20/2016  . Hemidiaphragm paralysis   . Wheelchair bound 02/03/2016  . Vaginal bleeding 01/28/2016  . Discoloration of skin 09/26/2015  . Essential thrombocytosis ([place]) 07/31/2015  . SVC (superior vena cava obstruction), chronic 07/20/2015  . Dysphagia 07/20/2015  . Constipation 07/20/2015  . Thrombophlebitis of breast, right 06/18/2015  . Tracheal deviation 06/06/2015  . Sensorineural hearing loss, bilateral, moderate-moderately severe 04/30/2015  . Abdominal wall lump 03/09/2015  . Frequent UTI 02/06/2015  . Urinary tract infection without hematuria 08/21/2014  . Primary  osteoarthritis involving multiple joints 07/26/2014  . Pernicious anemia 07/26/2014  . Hearing loss 07/26/2014  . Encounter for therapeutic drug monitoring 07/17/2014  . Spastic hemiparesis affecting dominant side ([place]), right 05/31/2014  . DVT of lower extremity, bilateral ([place]) 03/14/2014  . Global aphasia 03/14/2014  . 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 Aphasia S/P CVA 03/13/2014  . Cerebral infarction due to embolism of left middle cerebral artery ([place])   . Respiratory failure ([place])   . Stroke, embolic ([place]) 60/73/7106  . History of pulmonary embolism 03/08/2014  . Back pain 08/29/2013  . Primary localized osteoarthrosis, lower leg 03/06/2013  . IBS (irritable bowel syndrome)   . Multinodular goiter 01/13/2011  . Hyperthyroidism 11/11/2009  . Insomnia 07/03/2009  . POSTTRAUMATIC STRESS DISORDER 08/22/2008  . OBESITY 04/21/2008  . Osteoarthritis 04/21/2008  . MIGRAINE HEADACHE 04/20/2008  . Essential hypertension 04/20/2008    Current Outpatient Medications on File Prior to Visit  Medication Sig Dispense Refill  . acetaminophen (TYLENOL) 160 MG/5ML liquid Take 480 mg by mouth every 4 (four) hours as needed for fever or pain.    . calamine lotion Apply 1 application as needed topically for itching. 120 mL 0  . diclofenac sodium (VOLTAREN) 1 % GEL APPLY (2GMS) TOPICALLY THREE TIMES DAILY. 300 g 5  . guaiFENesin (MUCINEX) 600 MG 12 hr tablet Take 600 mg by mouth 2 (two) times daily.    [name] guaiFENesin (ROBITUSSIN) 100 MG/5ML SOLN Take 5 mLs (100 mg total) by mouth 2 (two) times daily. 1200 mL 0  . hydroxyurea (HYDREA) 500 MG capsule Take 1 tablet (500 mg) Monday through Friday, take 2 tablets ( 1000 mg ) Saturday and Sunday 90  capsule 3  . ipratropium-albuterol (DUONEB) 0.5-2.5 (3) MG/3ML SOLN Take 3 mLs by nebulization every 6 (six) hours as needed. (Patient taking differently: Take 3 mLs by nebulization every 6 (six) hours as needed (shortness of breath). ) 360 mL 0  . lidocaine  (LIDODERM) 5 % Place 1 patch daily onto the skin. Remove & Discard patch within 12 hours or as directed by MD (Patient taking differently: Place 1 patch onto the skin daily as needed (pain). Remove & Discard patch within 12 hours or as directed by MD) 15 patch 0  . methimazole (TAPAZOLE) 5 MG tablet TAKE 1 TABLET BY MOUTH TWICE WEEKLY 12 tablet 1  . Oral Hygiene Products (Q-CARE COVERD YANKAUER/SUCTION) MISC Use as directed 10 each 5  . OXYGEN 2lpm 24/7  DME- [facility]    . polyethylene glycol (MIRALAX / GLYCOLAX) packet TAKE 17G BY MOUTH TWICE A DAY (Patient taking differently: TAKE 17G BY MOUTH TWICE A DAY AS NEED FOR CONSTIPATION) 14 packet 1  . pregabalin (LYRICA) 25 MG capsule Take 1 capsule (25 mg total) by mouth 3 (three) times daily. 90 capsule 2  . Probiotic Product (ALIGN) 4 MG CAPS Take 4 mg by mouth daily.     . propranolol (INDERAL) 10 MG tablet Take 0.5 tablets (5 mg total) by mouth 2 (two) times daily. 60 tablet 5  . PROTONIX 40 MG PACK TAKE 20 MLS (40 MG TOTAL) BY MOUTH DAILY. 40 mL 5  . terconazole (TERAZOL 7) 0.4 % vaginal cream Place 1 applicator vaginally at bedtime. (Patient taking differently: Place 1 applicator vaginally at bedtime as needed (irritation). ) 90 g 3  . traMADol (ULTRAM) 50 MG tablet TAKE 2 TABLETS BY MOUTH 3 TIMES A DAY AS NEEDED 180 tablet 1  . traZODone (DESYREL) 50 MG tablet TAKE 1 TABLET (50 MG TOTAL) BY MOUTH AT BEDTIME AS NEEDED. FOR SLEEP 30 tablet 5  . triamcinolone ointment (KENALOG) 0.5 % Apply 1 application topically 2 (two) times daily. 30 g 0  . warfarin (COUMADIN) 2 MG tablet Take as directed by anticoagulation clinic. (Patient taking differently: Take 2 mg by mouth See admin instructions. Take 2 mg every tues,wed, thurs, sat, & sun) 30 tablet 3  . warfarin (COUMADIN) 4 MG tablet TAKE AS DIRECTED BY ANTICOAGULATION CLINIC. (Patient taking differently: Take 4 mg by mouth every Friday and  Monday) 40 tablet 3   No current facility-administered medications  on file prior to visit.     Past Medical History:  Diagnosis Date  . ARTHRITIS   . Arthritis   . Diverticulosis   . Essential thrombocytosis ([place])   . HYPERTENSION   . HYPERTHYROIDISM   . Hyperthyroidism    s/p I-131 ablation 03/2011 of multinod goiter  . INSOMNIA   . MIGRAINE HEADACHE   . OBESITY   . Posttraumatic stress disorder   . Pulmonary embolism ([place]) 03/2014   with DVT  . Stroke [facility]) 03/2014   dysarthria    Past Surgical History:  Procedure Laterality Date  . ABDOMINAL HYSTERECTOMY  1976  . BREAST SURGERY     biopsy  . RADIOLOGY WITH ANESTHESIA Left 03/08/2014   Procedure: RADIOLOGY WITH ANESTHESIA;  Surgeon: Rob Hickman, MD;  Location: [facility];  Service: Radiology;  Laterality: Left;  . TONSILLECTOMY    . TOTAL HIP ARTHROPLASTY  1998    right    Social History   Socioeconomic History  . Marital status: Widowed    Spouse name: Not on file  .  Number of children: 2  . Years of education: 83  . Highest education level: Not on file  Occupational History  . Occupation: retired    Comment: Restaurant manager, fast food  Social Needs  . Financial resource strain: Not on file  . Food insecurity:    Worry: Not on file    Inability: Not on file  . Transportation needs:    Medical: Not on file    Non-medical: Not on file  Tobacco Use  . Smoking status: Never Smoker  . Smokeless tobacco: Never Used  Substance and Sexual Activity  . Alcohol use: No    Alcohol/week: 0.0 oz  . Drug use: No  . Sexual activity: Never  Lifestyle  . Physical activity:    Days per week: Not on file    Minutes per session: Not on file  . Stress: Not on file  Relationships  . Social connections:    Talks on phone: Not on file    Gets together: Not on file    Attends religious service: Not on file    Active member of club or organization: Not on file    Attends meetings of clubs or organizations: Not on file    Relationship status: Not on file  Other Topics Concern  . Not on file    Social History Narrative   Widowed, lived alone prior to CVA 03/2014   SNF at [facility], then home 07/2014 with dtr   Right handed   Caffeine use- occasionally drinks tea    Family History  Problem Relation Age of Onset  . Asthma Mother   . Asthma Father   . Prostate cancer Father   . Stroke Brother   . Breast cancer Sister   . Stomach cancer Sister   . Thyroid disease Neg Hx     Review of Systems     Objective:  There were no vitals filed for this visit. BP Readings from Last 3 Encounters:  05/10/17 110/80  04/20/17 (!) 150/85  04/14/17 112/80   Wt Readings from Last 3 Encounters:  04/20/17 176 lb (79.8 kg)  02/23/17 176 lb (79.8 kg)  02/13/17 169 lb 5 oz (76.8 kg)   There is no height or weight on file to calculate BMI.   Physical Exam    Constitutional: Appears well-developed and well-nourished. No distress.  HENT:  Head: Normocephalic and atraumatic.  Neck: Neck supple. No tracheal deviation present. No thyromegaly present.  No cervical lymphadenopathy Cardiovascular: Normal rate, regular rhythm and normal heart sounds.   No murmur heard. No carotid bruit .  No edema Pulmonary/Chest: Effort normal and breath sounds normal. No respiratory distress. No has no wheezes. No rales.  Skin: Skin is warm and dry. Not diaphoretic.  Psychiatric: Normal mood and affect. Behavior is normal.      Assessment & Plan:    See Problem List for Assessment and Plan of chronic medical problems.

## 2017-05-18 ENCOUNTER — Ambulatory Visit (INDEPENDENT_AMBULATORY_CARE_PROVIDER_SITE_OTHER): Payer: Medicare Other | Admitting: General Practice

## 2017-05-18 DIAGNOSIS — Z7901 Long term (current) use of anticoagulants: Secondary | ICD-10-CM

## 2017-05-18 LAB — POCT INR: INR: 2.8

## 2017-05-18 NOTE — Patient Instructions (Signed)
Pre visit review using our clinic review tool, if applicable. No additional management support is needed unless otherwise documented below in the visit note. 

## 2017-05-22 NOTE — Progress Notes (Addendum)
Subjective:    Patient ID: Sandy Barrett, female    DOB: 12-27-1932, 84 y.o.   MRN: [id number]  HPI The patient is here for follow up.  She is here with her daughter who provides the history.  Patient is unable to provide history.  Hypertension:  When her daughter gets her up in the morning it looks she has some lightheadedness or photophobia.  She is unable no truly what her mother is experiencing, but wonders if this could be some mild lightheadedness.  Her blood pressure here today is much lower than it usually is.  History of stroke, aphasia, spastic hemiparesis of right side: She is taking her warfarin daily as prescribed.  There has been no change in her neurological deficits.  Nighttime hypoxia:  She uses oxygen at night only.  She is following with pulmonary.  Thrombocytosis: She is following with oncology and was just seen yesterday.  Goiter, hyperthyroidism: She is following with Dr. Cruzita Lederer.  She is hoping that she will be able to have the radioactive iodine treatment, but waiting to see if this will be approved by her insurance because it will require hospitalization for treatment.  Bilateral LE edema:  The left leg typically swells on occasion.  The feet swell regularly, but typically the swelling goes away by the morning.  Recently the swelling has gotten worse in both feet and left leg.  Her right arm and breast are also more swollen from the lymphedema.  She has had lymphedema therapy at home in the past and this was very helpful.[name]  UTI symptoms: She called recently because she was concerned that her mother may have a UTI.  I did prescribe an antibiotic, but she wanted the pills and this was not sent in after she called back.  She thinks the symptoms have resolved and that she is not currently concerned about her having a UTI.  She is chronic, intermittent excessive cerumen buildup in both ears.  She sees ENT on a regular basis to have this cleaned out.  She has been  seeing an ENT in [place] and would like to see someone in [place] to make it more convenient.  She does need a referral.  Medications and allergies reviewed with patient and updated if appropriate.  Patient Active Problem List   Diagnosis Date Noted  . Bilateral leg edema 05/24/2017  . Cholecystitis 02/23/2017  . Chronic respiratory failure with hypoxia ([place]) 02/23/2017  . HCAP (healthcare-associated pneumonia) 02/12/2017  . Elevated troponin   . CAP (community acquired pneumonia) 02/08/2017  . Neuralgia 01/09/2017  . Abnormal urine odor 10/28/2016  . Venous congestion 10/08/2016  . Lymphedema of breast 09/11/2016  . Long term (current) use of anticoagulants [Z79.01] 09/10/2016  . Left hip pain 08/09/2016  . Yeast infection of the skin 07/01/2016  . Left leg swelling 05/18/2016  . Pressure injury of skin 04/27/2016  . Aspiration pneumonia ([place]) 04/20/2016  . Hemidiaphragm paralysis   . Wheelchair bound 02/03/2016  . Excessive cerumen in both ear canals 12/31/2015  . Discoloration of skin 09/26/2015  . Essential thrombocytosis ([place]) 07/31/2015  . SVC (superior vena cava obstruction), chronic 07/20/2015  . Dysphagia 07/20/2015  . Constipation 07/20/2015  . Thrombophlebitis of breast, right 06/18/2015  . Tracheal deviation 06/06/2015  . Sensorineural hearing loss, bilateral, moderate-moderately severe 04/30/2015  . Abdominal wall lump 03/09/2015  . Frequent UTI 02/06/2015  . Primary osteoarthritis involving multiple joints 07/26/2014  . Pernicious anemia 07/26/2014  . Encounter for therapeutic drug  monitoring 07/17/2014  . Spastic hemiparesis affecting dominant side ([place]), right 05/31/2014  . DVT of lower extremity, bilateral ([place]) 03/14/2014  . Global aphasia 03/14/2014  . 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 Aphasia S/P CVA 03/13/2014  . Cerebral infarction due to embolism of left middle cerebral artery ([place])   . Stroke, embolic ([place]) 54/00/8676  . History of pulmonary  embolism 03/08/2014  . Primary localized osteoarthrosis, lower leg 03/06/2013  . IBS (irritable bowel syndrome)   . Multinodular goiter 01/13/2011  . Hyperthyroidism 11/11/2009  . OBESITY 04/21/2008  . Osteoarthritis 04/21/2008  . Migraine headache 04/20/2008  . Essential hypertension 04/20/2008    Current Outpatient Medications on File Prior to Visit  Medication Sig Dispense Refill  . acetaminophen (TYLENOL) 160 MG/5ML liquid Take 480 mg by mouth every 4 (four) hours as needed for fever or pain.    . calamine lotion Apply 1 application as needed topically for itching. 120 mL 0  . diclofenac sodium (VOLTAREN) 1 % GEL APPLY (2GMS) TOPICALLY THREE TIMES DAILY. 300 g 5  . guaiFENesin (MUCINEX) 600 MG 12 hr tablet Take 600 mg by mouth 2 (two) times daily.    [name] guaiFENesin (ROBITUSSIN) 100 MG/5ML SOLN Take 5 mLs (100 mg total) by mouth 2 (two) times daily. 1200 mL 0  . hydroxyurea (HYDREA) 500 MG capsule Take 1 tablet (500 mg) Monday through Friday, take 2 tablets ( 1000 mg ) Saturday and Sunday 90 capsule 3  . ipratropium-albuterol (DUONEB) 0.5-2.5 (3) MG/3ML SOLN Take 3 mLs by nebulization every 6 (six) hours as needed. (Patient taking differently: Take 3 mLs by nebulization every 6 (six) hours as needed (shortness of breath). ) 360 mL 0  . lidocaine (LIDODERM) 5 % Place 1 patch daily onto the skin. Remove & Discard patch within 12 hours or as directed by MD (Patient taking differently: Place 1 patch onto the skin daily as needed (pain). Remove & Discard patch within 12 hours or as directed by MD) 15 patch 0  . methimazole (TAPAZOLE) 5 MG tablet TAKE 1 TABLET BY MOUTH TWICE WEEKLY 12 tablet 1  . Oral Hygiene Products (Q-CARE COVERD [place]ER/SUCTION) MISC Use as directed 10 each 5  . OXYGEN 2lpm 24/7  DME- [facility]    . polyethylene glycol (MIRALAX / GLYCOLAX) packet TAKE 17G BY MOUTH TWICE A DAY (Patient taking differently: TAKE 17G BY MOUTH TWICE A DAY AS NEED FOR CONSTIPATION) 14 packet 1  .  pregabalin (LYRICA) 25 MG capsule Take 1 capsule (25 mg total) by mouth 3 (three) times daily. 90 capsule 2  . Probiotic Product (ALIGN) 4 MG CAPS Take 4 mg by mouth daily.     . propranolol (INDERAL) 10 MG tablet Take 0.5 tablets (5 mg total) by mouth 2 (two) times daily. 60 tablet 5  . PROTONIX 40 MG PACK TAKE 20 MLS (40 MG TOTAL) BY MOUTH DAILY. 40 mL 5  . terconazole (TERAZOL 7) 0.4 % vaginal cream Place 1 applicator vaginally at bedtime. (Patient taking differently: Place 1 applicator vaginally at bedtime as needed (irritation). ) 90 g 3  . traMADol (ULTRAM) 50 MG tablet TAKE 2 TABLETS BY MOUTH 3 TIMES A DAY AS NEEDED 180 tablet 1  . traZODone (DESYREL) 50 MG tablet TAKE 1 TABLET (50 MG TOTAL) BY MOUTH AT BEDTIME AS NEEDED. FOR SLEEP 30 tablet 5  . triamcinolone ointment (KENALOG) 0.5 % Apply 1 application topically 2 (two) times daily. 30 g 0  . warfarin (COUMADIN) 2 MG tablet Take  as directed by anticoagulation clinic. (Patient taking differently: Take 2 mg by mouth See admin instructions. Take 2 mg every tues,wed, thurs, sat, & sun) 30 tablet 3  . warfarin (COUMADIN) 4 MG tablet TAKE AS DIRECTED BY ANTICOAGULATION CLINIC. (Patient taking differently: Take 4 mg by mouth every Friday and  Monday) 40 tablet 3   No current facility-administered medications on file prior to visit.     Past Medical History:  Diagnosis Date  . ARTHRITIS   . Arthritis   . Diverticulosis   . Essential thrombocytosis ([place])   . HYPERTENSION   . HYPERTHYROIDISM   . Hyperthyroidism    s/p I-131 ablation 03/2011 of multinod goiter  . INSOMNIA   . MIGRAINE HEADACHE   . OBESITY   . Posttraumatic stress disorder   . Pulmonary embolism ([place]) 03/2014   with DVT  . Stroke [facility]) 03/2014   dysarthria    Past Surgical History:  Procedure Laterality Date  . ABDOMINAL HYSTERECTOMY  1976  . BREAST SURGERY     biopsy  . RADIOLOGY WITH ANESTHESIA Left 03/08/2014   Procedure: RADIOLOGY WITH ANESTHESIA;  Surgeon:  Rob Hickman, MD;  Location: [facility];  Service: Radiology;  Laterality: Left;  . TONSILLECTOMY    . TOTAL HIP ARTHROPLASTY  1998    right    Social History   Socioeconomic History  . Marital status: Widowed    Spouse name: Not on file  . Number of children: 2  . Years of education: 51  . Highest education level: Not on file  Occupational History  . Occupation: retired    Comment: Restaurant manager, fast food  Social Needs  . Financial resource strain: Not on file  . Food insecurity:    Worry: Not on file    Inability: Not on file  . Transportation needs:    Medical: Not on file    Non-medical: Not on file  Tobacco Use  . Smoking status: Never Smoker  . Smokeless tobacco: Never Used  Substance and Sexual Activity  . Alcohol use: No    Alcohol/week: 0.0 oz  . Drug use: No  . Sexual activity: Never  Lifestyle  . Physical activity:    Days per week: Not on file    Minutes per session: Not on file  . Stress: Not on file  Relationships  . Social connections:    Talks on phone: Not on file    Gets together: Not on file    Attends religious service: Not on file    Active member of club or organization: Not on file    Attends meetings of clubs or organizations: Not on file    Relationship status: Not on file  Other Topics Concern  . Not on file  Social History Narrative   Widowed, lived alone prior to CVA 03/2014   S[facility], then home 07/2014 with dtr   Right handed   Caffeine use- occasionally drinks tea    Family History  Problem Relation Age of Onset  . Asthma Mother   . Asthma Father   . Prostate cancer Father   . Stroke Brother   . Breast cancer Sister   . Stomach cancer Sister   . Thyroid disease Neg Hx     Review of Systems Unable to obtain-patient is aphasic from stroke    Objective:   Vitals:   05/24/17 1521  BP: (!) 80/50  Pulse: 87  Resp: 16  Temp: 98.5 F (36.9 C)  SpO2:  92%   BP Readings from Last 3 Encounters:  05/24/17 (!)  80/50  05/10/17 110/80  04/20/17 (!) 150/85   Wt Readings from Last 3 Encounters:  04/20/17 176 lb (79.8 kg)  02/23/17 176 lb (79.8 kg)  02/13/17 169 lb 5 oz (76.8 kg)   There is no height or weight on file to calculate BMI.   Physical Exam    Constitutional: Appears well-developed and well-nourished. No distress.  HENT:  Head: Normocephalic and atraumatic.  Neck: Neck supple. No tracheal deviation present. No thyromegaly present.  No cervical lymphadenopathy Cardiovascular: Normal rate, regular rhythm and normal heart sounds.   3/6 systolic murmur heard. No carotid bruit .  Right upper extremity and right breast lymphedema.  Bilateral lower extremity pitting edema up to mid calf, posterior upper thigh edema Pulmonary/Chest: Effort poor, breath sounds normal. No respiratory distress. No has no wheezes. No rales.  Skin: Skin is warm and dry. Not diaphoretic.  Unable to view pressure ulcer Psychiatric: Normal mood and affect. Behavior is normal.    Need referral [facility] - lympedemaright arm/breast and legs, pressure   Assessment & Plan:    See Problem List for Assessment and Plan of chronic medical problems.

## 2017-05-24 ENCOUNTER — Encounter: Payer: Self-pay | Admitting: Internal Medicine

## 2017-05-24 ENCOUNTER — Other Ambulatory Visit (INDEPENDENT_AMBULATORY_CARE_PROVIDER_SITE_OTHER): Payer: Medicare Other

## 2017-05-24 ENCOUNTER — Telehealth: Payer: Self-pay | Admitting: Emergency Medicine

## 2017-05-24 ENCOUNTER — Ambulatory Visit (INDEPENDENT_AMBULATORY_CARE_PROVIDER_SITE_OTHER): Payer: Medicare Other | Admitting: Internal Medicine

## 2017-05-24 VITALS — BP 80/50 | HR 87 | Temp 98.5°F | Resp 16

## 2017-05-24 DIAGNOSIS — H6123 Impacted cerumen, bilateral: Secondary | ICD-10-CM

## 2017-05-24 DIAGNOSIS — I890 Lymphedema, not elsewhere classified: Secondary | ICD-10-CM

## 2017-05-24 DIAGNOSIS — L89309 Pressure ulcer of unspecified buttock, unspecified stage: Secondary | ICD-10-CM

## 2017-05-24 DIAGNOSIS — I959 Hypotension, unspecified: Secondary | ICD-10-CM | POA: Diagnosis not present

## 2017-05-24 DIAGNOSIS — R600 Localized edema: Secondary | ICD-10-CM

## 2017-05-24 DIAGNOSIS — I10 Essential (primary) hypertension: Secondary | ICD-10-CM

## 2017-05-24 DIAGNOSIS — G43809 Other migraine, not intractable, without status migrainosus: Secondary | ICD-10-CM | POA: Diagnosis not present

## 2017-05-24 LAB — COMPREHENSIVE METABOLIC PANEL
ALT: 5 U/L (ref 0–35)
AST: 10 U/L (ref 0–37)
Albumin: 3.3 g/dL — ABNORMAL LOW (ref 3.5–5.2)
Alkaline Phosphatase: 57 U/L (ref 39–117)
BUN: 13 mg/dL (ref 6–23)
CO2: 28 mEq/L (ref 19–32)
Calcium: 8.8 mg/dL (ref 8.4–10.5)
Chloride: 106 mEq/L (ref 96–112)
Creatinine, Ser: 0.31 mg/dL — ABNORMAL LOW (ref 0.40–1.20)
GFR: 263.24 mL/min (ref >60.00)
Glucose, Bld: 79 mg/dL (ref 70–99)
Potassium: 4.1 mEq/L (ref 3.5–5.1)
Sodium: 142 mEq/L (ref 135–145)
Total Bilirubin: 0.7 mg/dL (ref 0.2–1.2)
Total Protein: 6.5 g/dL (ref 6.0–8.3)

## 2017-05-24 LAB — BRAIN NATRIURETIC PEPTIDE: Pro B Natriuretic peptide (BNP): 69 pg/mL (ref 0.0–100.0)

## 2017-05-24 NOTE — Patient Instructions (Addendum)
  Test(s) ordered today. Your results will be released to [place] (or called to you) after review, usually within 72hours after test completion. If any changes need to be made, you will be notified at that same time.   Medications reviewed and updated.  No changes recommended at this time.  Elevate the legs as much as possible.   A referral was ordered for [facility]  Please followup in 2-3 months

## 2017-05-24 NOTE — Assessment & Plan Note (Signed)
Swelling in both legs has gotten worse recently and has been more persistent We will check CMP, BNP Not currently on a diuretic with a low blood pressure now she will likely not tolerate a diuretic Will request home PT for physical therapy that may help with leg swelling and upper extremity lymphedema elevate Legs and arms more religiously Monitor

## 2017-05-24 NOTE — Assessment & Plan Note (Signed)
Will refer to ENT for routine cleaning

## 2017-05-24 NOTE — Assessment & Plan Note (Signed)
This has gotten worse and this did improve with home PT/lymphedema treatment We will request home PT   for lymphedema treatment

## 2017-05-24 NOTE — Telephone Encounter (Signed)
Noted  

## 2017-05-24 NOTE — Telephone Encounter (Signed)
Copied from [place] [id number]. Topic: Inquiry >> May 21, 2017  5:07 PM Oliver Pila B wrote: Reason for CRM: [facility] called b/c they were not able to see pt today and they would like to delay services until next Tuesday; c

## 2017-05-24 NOTE — Addendum Note (Signed)
Addended by: [name] on: 05/24/2017 08:50 PM   Modules accepted: Orders

## 2017-05-24 NOTE — Assessment & Plan Note (Signed)
Has been on prevention with propranolol for years ?  Having migraines Blood pressure remains low we may need to discontinue the propranolol

## 2017-05-24 NOTE — Assessment & Plan Note (Signed)
Blood pressure on the low side-this is lower than it typically is She appears to be asymptomatic, but it is difficult to tell since she is a phasic She is taking the propranolol for prevention of migraines-?  Truly needs If BP stays low we will need to discontinue this We will request nursing to monitor blood pressure

## 2017-05-26 ENCOUNTER — Telehealth: Payer: Self-pay | Admitting: Emergency Medicine

## 2017-05-26 ENCOUNTER — Telehealth: Payer: Self-pay | Admitting: Internal Medicine

## 2017-05-26 NOTE — Telephone Encounter (Signed)
Noted  

## 2017-05-26 NOTE — Telephone Encounter (Signed)
Copied from [place] [id number]. Topic: General - Other >> May 25, 2017  5:12 PM Yvette Rack wrote: Reason for CRM: Beth with [facility] states they were unable to see the pt today 05/25/17 however they will see her tomorrow 05/26/17. Cb# [phone]

## 2017-05-26 NOTE — Telephone Encounter (Signed)
Copied from [place] [id number]. Topic: General - Other >> May 26, 2017  3:20 PM Valla Leaver wrote: Reason for CRM: Manuela Schwartz RN, with [facility] calling to notify Dr, Quay Burow that the patient refused all services from [facility]. Please call back for additional information if needed and/or follow up with the patient.

## 2017-05-28 ENCOUNTER — Telehealth: Payer: Self-pay | Admitting: Emergency Medicine

## 2017-05-28 NOTE — Telephone Encounter (Signed)
Received voicemail from Pts daughter. States that [facility] is the agency that has an OT that specializes in Lymphedema therapy and has found them the most helpful. Would like referral sent to [facility].

## 2017-05-30 NOTE — Telephone Encounter (Signed)
I ordered a referral on 5/20 - looks like it was already sent to [facility] -- can we change this to [facility]?

## 2017-06-01 NOTE — Telephone Encounter (Signed)
I will send referral to [facility]

## 2017-06-02 ENCOUNTER — Telehealth: Payer: Self-pay | Admitting: Internal Medicine

## 2017-06-02 NOTE — Telephone Encounter (Signed)
Copied from [place]. Topic: Quick Communication - See Telephone Encounter >> Jun 02, 2017  4:13 PM Bea Graff, NT wrote: CRM for notification. See Telephone encounter for: 06/02/17. Darlina Guys with [facility] calling and states that the orders wanted them to start seeing the pt today, but they will be unable to see the pt until 06/05/17. For nursing and PT. CB#: [phone]

## 2017-06-03 NOTE — Telephone Encounter (Signed)
Noted  

## 2017-06-05 DIAGNOSIS — M1991 Primary osteoarthritis, unspecified site: Secondary | ICD-10-CM | POA: Diagnosis not present

## 2017-06-05 DIAGNOSIS — D510 Vitamin B12 deficiency anemia due to intrinsic factor deficiency: Secondary | ICD-10-CM | POA: Diagnosis not present

## 2017-06-05 DIAGNOSIS — I69320 Aphasia following cerebral infarction: Secondary | ICD-10-CM | POA: Diagnosis not present

## 2017-06-05 DIAGNOSIS — I10 Essential (primary) hypertension: Secondary | ICD-10-CM | POA: Diagnosis not present

## 2017-06-05 DIAGNOSIS — I890 Lymphedema, not elsewhere classified: Secondary | ICD-10-CM | POA: Diagnosis not present

## 2017-06-05 DIAGNOSIS — I69351 Hemiplegia and hemiparesis following cerebral infarction affecting right dominant side: Secondary | ICD-10-CM | POA: Diagnosis not present

## 2017-06-07 ENCOUNTER — Telehealth: Payer: Self-pay | Admitting: Internal Medicine

## 2017-06-07 NOTE — Telephone Encounter (Signed)
LVm with [facility] Bamberg giving verbal orders for nursing and OT eval. Per MD

## 2017-06-07 NOTE — Telephone Encounter (Signed)
Copied from [place] [id number]. Topic: Quick Communication - See Telephone Encounter >> Jun 07, 2017  2:42 PM Robina Ade, [name] D wrote: CRM for notification. See Telephone encounter for: 06/07/17. Estill Bamberg with [facility] called and would like verbal order for nursing for patient as follow: 1 X a week for 6 weeks, 2 PRM and OT for eval entry. She can be reached at [phone].

## 2017-06-08 DIAGNOSIS — H6122 Impacted cerumen, left ear: Secondary | ICD-10-CM | POA: Diagnosis not present

## 2017-06-08 DIAGNOSIS — H903 Sensorineural hearing loss, bilateral: Secondary | ICD-10-CM | POA: Diagnosis not present

## 2017-06-09 DIAGNOSIS — I69320 Aphasia following cerebral infarction: Secondary | ICD-10-CM | POA: Diagnosis not present

## 2017-06-09 DIAGNOSIS — I890 Lymphedema, not elsewhere classified: Secondary | ICD-10-CM | POA: Diagnosis not present

## 2017-06-09 DIAGNOSIS — D510 Vitamin B12 deficiency anemia due to intrinsic factor deficiency: Secondary | ICD-10-CM | POA: Diagnosis not present

## 2017-06-09 DIAGNOSIS — M1991 Primary osteoarthritis, unspecified site: Secondary | ICD-10-CM | POA: Diagnosis not present

## 2017-06-09 DIAGNOSIS — I10 Essential (primary) hypertension: Secondary | ICD-10-CM | POA: Diagnosis not present

## 2017-06-09 DIAGNOSIS — I69351 Hemiplegia and hemiparesis following cerebral infarction affecting right dominant side: Secondary | ICD-10-CM | POA: Diagnosis not present

## 2017-06-09 NOTE — Telephone Encounter (Signed)
Please advise on TED hose or compression stockings.

## 2017-06-09 NOTE — Telephone Encounter (Signed)
Sandy Barrett, OT with [facility] calling for a verbal for OT with frequency 2x a wk for 4wks for lymphemdema and edema management in right arm.  Needs to know if she needs TED hose or a light knee high compression stockings at 10 to 20  MMHG?

## 2017-06-09 NOTE — Telephone Encounter (Signed)
She can do either TED hose or light compression socks and 10- 20 mmHg is ideal -- usually TED hose are lighter compression than compression socks

## 2017-06-10 NOTE — Telephone Encounter (Signed)
Spoke with Sandy Barrett to give verbal orders for OT per MD, advised on MDs note. She would also like verbal orders for Kinesio taping on right breast area. Daughter does not feel that she will wear the compression sleeve. Verbal orders given per MD.

## 2017-06-11 DIAGNOSIS — I69351 Hemiplegia and hemiparesis following cerebral infarction affecting right dominant side: Secondary | ICD-10-CM | POA: Diagnosis not present

## 2017-06-11 DIAGNOSIS — M1991 Primary osteoarthritis, unspecified site: Secondary | ICD-10-CM | POA: Diagnosis not present

## 2017-06-11 DIAGNOSIS — D510 Vitamin B12 deficiency anemia due to intrinsic factor deficiency: Secondary | ICD-10-CM | POA: Diagnosis not present

## 2017-06-11 DIAGNOSIS — I10 Essential (primary) hypertension: Secondary | ICD-10-CM | POA: Diagnosis not present

## 2017-06-11 DIAGNOSIS — I69320 Aphasia following cerebral infarction: Secondary | ICD-10-CM | POA: Diagnosis not present

## 2017-06-11 DIAGNOSIS — I890 Lymphedema, not elsewhere classified: Secondary | ICD-10-CM | POA: Diagnosis not present

## 2017-06-14 ENCOUNTER — Encounter: Payer: Self-pay | Admitting: Emergency Medicine

## 2017-06-15 ENCOUNTER — Ambulatory Visit: Payer: Medicare Other

## 2017-06-15 DIAGNOSIS — D510 Vitamin B12 deficiency anemia due to intrinsic factor deficiency: Secondary | ICD-10-CM | POA: Diagnosis not present

## 2017-06-15 DIAGNOSIS — I69320 Aphasia following cerebral infarction: Secondary | ICD-10-CM | POA: Diagnosis not present

## 2017-06-15 DIAGNOSIS — I69351 Hemiplegia and hemiparesis following cerebral infarction affecting right dominant side: Secondary | ICD-10-CM | POA: Diagnosis not present

## 2017-06-15 DIAGNOSIS — M1991 Primary osteoarthritis, unspecified site: Secondary | ICD-10-CM | POA: Diagnosis not present

## 2017-06-15 DIAGNOSIS — I890 Lymphedema, not elsewhere classified: Secondary | ICD-10-CM | POA: Diagnosis not present

## 2017-06-15 DIAGNOSIS — I10 Essential (primary) hypertension: Secondary | ICD-10-CM | POA: Diagnosis not present

## 2017-06-17 ENCOUNTER — Ambulatory Visit (INDEPENDENT_AMBULATORY_CARE_PROVIDER_SITE_OTHER): Payer: Medicare Other | Admitting: General Practice

## 2017-06-17 DIAGNOSIS — I890 Lymphedema, not elsewhere classified: Secondary | ICD-10-CM | POA: Diagnosis not present

## 2017-06-17 DIAGNOSIS — F4310 Post-traumatic stress disorder, unspecified: Secondary | ICD-10-CM | POA: Diagnosis not present

## 2017-06-17 DIAGNOSIS — Z5181 Encounter for therapeutic drug level monitoring: Secondary | ICD-10-CM

## 2017-06-17 DIAGNOSIS — G43809 Other migraine, not intractable, without status migrainosus: Secondary | ICD-10-CM | POA: Diagnosis not present

## 2017-06-17 DIAGNOSIS — R1310 Dysphagia, unspecified: Secondary | ICD-10-CM | POA: Diagnosis not present

## 2017-06-17 DIAGNOSIS — K5790 Diverticulosis of intestine, part unspecified, without perforation or abscess without bleeding: Secondary | ICD-10-CM | POA: Diagnosis not present

## 2017-06-17 DIAGNOSIS — M1991 Primary osteoarthritis, unspecified site: Secondary | ICD-10-CM | POA: Diagnosis not present

## 2017-06-17 DIAGNOSIS — Z86718 Personal history of other venous thrombosis and embolism: Secondary | ICD-10-CM

## 2017-06-17 DIAGNOSIS — I69351 Hemiplegia and hemiparesis following cerebral infarction affecting right dominant side: Secondary | ICD-10-CM | POA: Diagnosis not present

## 2017-06-17 DIAGNOSIS — Z96641 Presence of right artificial hip joint: Secondary | ICD-10-CM

## 2017-06-17 DIAGNOSIS — Z87440 Personal history of urinary (tract) infections: Secondary | ICD-10-CM

## 2017-06-17 DIAGNOSIS — Z7901 Long term (current) use of anticoagulants: Secondary | ICD-10-CM | POA: Diagnosis not present

## 2017-06-17 DIAGNOSIS — I69320 Aphasia following cerebral infarction: Secondary | ICD-10-CM | POA: Diagnosis not present

## 2017-06-17 DIAGNOSIS — Z8701 Personal history of pneumonia (recurrent): Secondary | ICD-10-CM

## 2017-06-17 DIAGNOSIS — Z9981 Dependence on supplemental oxygen: Secondary | ICD-10-CM

## 2017-06-17 DIAGNOSIS — D510 Vitamin B12 deficiency anemia due to intrinsic factor deficiency: Secondary | ICD-10-CM | POA: Diagnosis not present

## 2017-06-17 DIAGNOSIS — Z9181 History of falling: Secondary | ICD-10-CM

## 2017-06-17 DIAGNOSIS — Z86711 Personal history of pulmonary embolism: Secondary | ICD-10-CM

## 2017-06-17 DIAGNOSIS — L89322 Pressure ulcer of left buttock, stage 2: Secondary | ICD-10-CM | POA: Diagnosis not present

## 2017-06-17 DIAGNOSIS — I10 Essential (primary) hypertension: Secondary | ICD-10-CM | POA: Diagnosis not present

## 2017-06-17 DIAGNOSIS — I69322 Dysarthria following cerebral infarction: Secondary | ICD-10-CM | POA: Diagnosis not present

## 2017-06-17 LAB — POCT INR: INR: 1.6 — AB (ref 2.0–3.0)

## 2017-06-17 NOTE — Patient Instructions (Signed)
Pre visit review using our clinic review tool, if applicable. No additional management support is needed unless otherwise documented below in the visit note.  Please take 4 mg today and 6 mg tomorrow and then continue to take 2 mg daily except 4 mg on Mondays and Fridays.  Re-check in 4 weeks. New dosing instructions given to April, RN while in the home.  April is with [facility] @ [phone]

## 2017-06-22 DIAGNOSIS — I10 Essential (primary) hypertension: Secondary | ICD-10-CM | POA: Diagnosis not present

## 2017-06-22 DIAGNOSIS — M1991 Primary osteoarthritis, unspecified site: Secondary | ICD-10-CM | POA: Diagnosis not present

## 2017-06-22 DIAGNOSIS — I69320 Aphasia following cerebral infarction: Secondary | ICD-10-CM | POA: Diagnosis not present

## 2017-06-22 DIAGNOSIS — D510 Vitamin B12 deficiency anemia due to intrinsic factor deficiency: Secondary | ICD-10-CM | POA: Diagnosis not present

## 2017-06-22 DIAGNOSIS — I69351 Hemiplegia and hemiparesis following cerebral infarction affecting right dominant side: Secondary | ICD-10-CM | POA: Diagnosis not present

## 2017-06-22 DIAGNOSIS — I890 Lymphedema, not elsewhere classified: Secondary | ICD-10-CM | POA: Diagnosis not present

## 2017-06-24 ENCOUNTER — Telehealth: Payer: Self-pay | Admitting: Internal Medicine

## 2017-06-24 DIAGNOSIS — I69320 Aphasia following cerebral infarction: Secondary | ICD-10-CM | POA: Diagnosis not present

## 2017-06-24 DIAGNOSIS — D510 Vitamin B12 deficiency anemia due to intrinsic factor deficiency: Secondary | ICD-10-CM | POA: Diagnosis not present

## 2017-06-24 DIAGNOSIS — I890 Lymphedema, not elsewhere classified: Secondary | ICD-10-CM | POA: Diagnosis not present

## 2017-06-24 DIAGNOSIS — I69351 Hemiplegia and hemiparesis following cerebral infarction affecting right dominant side: Secondary | ICD-10-CM | POA: Diagnosis not present

## 2017-06-24 DIAGNOSIS — M1991 Primary osteoarthritis, unspecified site: Secondary | ICD-10-CM | POA: Diagnosis not present

## 2017-06-24 DIAGNOSIS — I10 Essential (primary) hypertension: Secondary | ICD-10-CM | POA: Diagnosis not present

## 2017-06-24 NOTE — Telephone Encounter (Signed)
Copied from [place] [phone]. Topic: Quick Communication - See Telephone Encounter >> Jun 24, 2017  1:52 PM Burchel, Abbi R wrote: See Telephone encounter for: 06/24/17.  [place] [phone]) called to give INR results.   INR: 2.0   Please call to advise.

## 2017-06-25 ENCOUNTER — Ambulatory Visit (INDEPENDENT_AMBULATORY_CARE_PROVIDER_SITE_OTHER): Payer: Medicare Other | Admitting: General Practice

## 2017-06-25 DIAGNOSIS — Z7901 Long term (current) use of anticoagulants: Secondary | ICD-10-CM

## 2017-06-25 LAB — POCT INR: INR: 2 (ref 2.0–3.0)

## 2017-06-25 NOTE — Telephone Encounter (Signed)
Management per [name]

## 2017-06-25 NOTE — Patient Instructions (Addendum)
Pre visit review using our clinic review tool, if applicable. No additional management support is needed unless otherwise documented below in the visit note.  Continue to take 2 mg daily except 4 mg on Mondays and Fridays.  Re-check in 1 week. New dosing instructions given to [place], RN while in the home.  Sandy [facility] is with [facility] @ [phone].

## 2017-06-25 NOTE — Telephone Encounter (Signed)
Noted  

## 2017-06-30 ENCOUNTER — Other Ambulatory Visit: Payer: Self-pay | Admitting: Internal Medicine

## 2017-06-30 ENCOUNTER — Telehealth: Payer: Self-pay | Admitting: Emergency Medicine

## 2017-06-30 ENCOUNTER — Other Ambulatory Visit: Payer: Self-pay | Admitting: Endocrinology

## 2017-06-30 NOTE — Telephone Encounter (Signed)
Received call from pts daughter. States that at last OV she mentioned a combination medication of Diclofenac, Gabapentin, Lidocaine, and menthol. She would like it sent to [facility]

## 2017-06-30 NOTE — Telephone Encounter (Signed)
[place] Controlled Substance Database checked. Last filled on 05/27/17

## 2017-07-01 ENCOUNTER — Ambulatory Visit (INDEPENDENT_AMBULATORY_CARE_PROVIDER_SITE_OTHER): Payer: Medicare Other | Admitting: General Practice

## 2017-07-01 DIAGNOSIS — I69320 Aphasia following cerebral infarction: Secondary | ICD-10-CM | POA: Diagnosis not present

## 2017-07-01 DIAGNOSIS — Z7901 Long term (current) use of anticoagulants: Secondary | ICD-10-CM

## 2017-07-01 DIAGNOSIS — I890 Lymphedema, not elsewhere classified: Secondary | ICD-10-CM | POA: Diagnosis not present

## 2017-07-01 DIAGNOSIS — D510 Vitamin B12 deficiency anemia due to intrinsic factor deficiency: Secondary | ICD-10-CM | POA: Diagnosis not present

## 2017-07-01 DIAGNOSIS — I69351 Hemiplegia and hemiparesis following cerebral infarction affecting right dominant side: Secondary | ICD-10-CM | POA: Diagnosis not present

## 2017-07-01 DIAGNOSIS — I10 Essential (primary) hypertension: Secondary | ICD-10-CM | POA: Diagnosis not present

## 2017-07-01 DIAGNOSIS — M1991 Primary osteoarthritis, unspecified site: Secondary | ICD-10-CM | POA: Diagnosis not present

## 2017-07-01 LAB — POCT INR: INR: 1.5 — AB (ref 2.0–3.0)

## 2017-07-01 MED ORDER — NONFORMULARY OR COMPOUNDED ITEM: 5 refills | Status: AC

## 2017-07-01 NOTE — Patient Instructions (Signed)
Pre visit review using our clinic review tool, if applicable. No additional management support is needed unless otherwise documented below in the visit note.  Take 1 tablet today and 1 1/2 tablets tomorrow and then change to 1/2 tablet daily except 1 tablet on Mondays/Wed and Fridays.  Re-check in 1 week. New dosing instructions given to Tiffany, RN @ [facility] while in the home.  [phone]

## 2017-07-01 NOTE — Telephone Encounter (Signed)
Spoke with pts daughter to inform RX was being faxed to [facility]

## 2017-07-01 NOTE — Telephone Encounter (Signed)
rx printed

## 2017-07-02 DIAGNOSIS — M1991 Primary osteoarthritis, unspecified site: Secondary | ICD-10-CM | POA: Diagnosis not present

## 2017-07-02 DIAGNOSIS — D510 Vitamin B12 deficiency anemia due to intrinsic factor deficiency: Secondary | ICD-10-CM | POA: Diagnosis not present

## 2017-07-02 DIAGNOSIS — I69351 Hemiplegia and hemiparesis following cerebral infarction affecting right dominant side: Secondary | ICD-10-CM | POA: Diagnosis not present

## 2017-07-02 DIAGNOSIS — I10 Essential (primary) hypertension: Secondary | ICD-10-CM | POA: Diagnosis not present

## 2017-07-02 DIAGNOSIS — I69320 Aphasia following cerebral infarction: Secondary | ICD-10-CM | POA: Diagnosis not present

## 2017-07-02 DIAGNOSIS — I890 Lymphedema, not elsewhere classified: Secondary | ICD-10-CM | POA: Diagnosis not present

## 2017-07-05 ENCOUNTER — Telehealth: Payer: Self-pay | Admitting: Emergency Medicine

## 2017-07-05 NOTE — Telephone Encounter (Signed)
Received call from pts daughter stating she thinks pt may have another UTI. Spoke with Estill Bamberg from [facility] to give verbal orders for UA and UC

## 2017-07-07 ENCOUNTER — Ambulatory Visit (INDEPENDENT_AMBULATORY_CARE_PROVIDER_SITE_OTHER): Payer: Medicare Other | Admitting: General Practice

## 2017-07-07 DIAGNOSIS — I69351 Hemiplegia and hemiparesis following cerebral infarction affecting right dominant side: Secondary | ICD-10-CM | POA: Diagnosis not present

## 2017-07-07 DIAGNOSIS — I2699 Other pulmonary embolism without acute cor pulmonale: Secondary | ICD-10-CM

## 2017-07-07 DIAGNOSIS — D510 Vitamin B12 deficiency anemia due to intrinsic factor deficiency: Secondary | ICD-10-CM | POA: Diagnosis not present

## 2017-07-07 DIAGNOSIS — M1991 Primary osteoarthritis, unspecified site: Secondary | ICD-10-CM | POA: Diagnosis not present

## 2017-07-07 DIAGNOSIS — N390 Urinary tract infection, site not specified: Secondary | ICD-10-CM | POA: Diagnosis not present

## 2017-07-07 DIAGNOSIS — I82403 Acute embolism and thrombosis of unspecified deep veins of lower extremity, bilateral: Secondary | ICD-10-CM

## 2017-07-07 DIAGNOSIS — I890 Lymphedema, not elsewhere classified: Secondary | ICD-10-CM | POA: Diagnosis not present

## 2017-07-07 DIAGNOSIS — I69320 Aphasia following cerebral infarction: Secondary | ICD-10-CM | POA: Diagnosis not present

## 2017-07-07 DIAGNOSIS — Z7901 Long term (current) use of anticoagulants: Secondary | ICD-10-CM

## 2017-07-07 DIAGNOSIS — I10 Essential (primary) hypertension: Secondary | ICD-10-CM | POA: Diagnosis not present

## 2017-07-07 LAB — POCT INR: INR: 1.7 — AB (ref 2.0–3.0)

## 2017-07-07 NOTE — Patient Instructions (Addendum)
Pre visit review using our clinic review tool, if applicable. No additional management support is needed unless otherwise documented below in the visit note.  Take 1 1/2 tablets today and 1 tablet tomorrow and then change to 1/2 tablet daily except 1 tablet on Mondays/Wed and Fridays and Saturdays.  Re-check in 1 week. New dosing instructions given to Tiffany, RN @ [facility] while in the home.  [phone]

## 2017-07-12 ENCOUNTER — Other Ambulatory Visit: Payer: Self-pay | Admitting: Emergency Medicine

## 2017-07-12 ENCOUNTER — Telehealth: Payer: Self-pay | Admitting: Internal Medicine

## 2017-07-12 MED ORDER — PROPRANOLOL HCL 10 MG PO TABS: 5.0000 mg | ORAL_TABLET | Freq: Two times a day (BID) | ORAL | 5 refills | Status: DC

## 2017-07-12 MED ORDER — AMOXICILLIN-POT CLAVULANATE 875-125 MG PO TABS
1.0000 | ORAL_TABLET | Freq: Two times a day (BID) | ORAL | 0 refills | Status: DC
Start: 2017-07-12 — End: 2017-08-17

## 2017-07-12 NOTE — Telephone Encounter (Signed)
LVM advising augmentin tablets are being sent to [facility]

## 2017-07-12 NOTE — Telephone Encounter (Signed)
Urine shows an infection - we need to try -- confirm she wants augmentin - the pills not the liquid and I can send that to her pharmacy.

## 2017-07-13 ENCOUNTER — Encounter: Payer: Self-pay | Admitting: Podiatry

## 2017-07-13 ENCOUNTER — Ambulatory Visit (INDEPENDENT_AMBULATORY_CARE_PROVIDER_SITE_OTHER): Payer: Medicare Other | Admitting: Podiatry

## 2017-07-13 DIAGNOSIS — M79675 Pain in left toe(s): Secondary | ICD-10-CM | POA: Diagnosis not present

## 2017-07-13 DIAGNOSIS — B351 Tinea unguium: Secondary | ICD-10-CM | POA: Diagnosis not present

## 2017-07-13 DIAGNOSIS — M79674 Pain in right toe(s): Secondary | ICD-10-CM

## 2017-07-13 DIAGNOSIS — D689 Coagulation defect, unspecified: Secondary | ICD-10-CM

## 2017-07-13 NOTE — Progress Notes (Signed)
Complaint:  Visit Type: Patient returns to my office for continued preventative foot care services.  She is accompanied by her daughter.   Complaint: Patient daughter says her mothers nails  have grown long and thick and become painful  Wearing shoes. shoes" Patient has been diagnosed with CVA.  . The patient presents for preventative foot care services. No changes to ROS.  Patient is taking coumadin.  Podiatric Exam: Vascular: dorsalis pedis and posterior tibial pulses are palpable bilateral. Capillary return is immediate. Temperature gradient is WNL. Skin turgor WNL  Sensorium: Deferred since patient is unable to communicate. Nail Exam: Pt has thick disfigured discolored nails with subungual debris noted bilateral entire nail hallux through fifth toenails Ulcer Exam: There is no evidence of ulcer or pre-ulcerative changes or infection. Orthopedic Exam: Muscle tone and strength are WNL. No limitations in general ROM. No crepitus or effusions noted. Foot type and digits show no abnormalities. Bony prominences are unremarkable. Skin: No Porokeratosis. No infection or ulcers  Diagnosis:  Onychomycosis, , Pain in right toe, pain in left toes  Treatment & Plan Procedures and Treatment: Consent by patient was obtained for treatment procedures.   Debridement of mycotic and hypertrophic toenails, 1 through 5 bilateral and clearing of subungual debris. No ulceration, no infection noted. ABN signed for 2019. Return Visit-Office Procedure: Patient instructed to return to the office for a follow up visit 3 months for continued evaluation and treatment.    Gardiner Barefoot DPM

## 2017-07-15 DIAGNOSIS — I10 Essential (primary) hypertension: Secondary | ICD-10-CM | POA: Diagnosis not present

## 2017-07-15 DIAGNOSIS — M1991 Primary osteoarthritis, unspecified site: Secondary | ICD-10-CM | POA: Diagnosis not present

## 2017-07-15 DIAGNOSIS — I69351 Hemiplegia and hemiparesis following cerebral infarction affecting right dominant side: Secondary | ICD-10-CM | POA: Diagnosis not present

## 2017-07-15 DIAGNOSIS — I69320 Aphasia following cerebral infarction: Secondary | ICD-10-CM | POA: Diagnosis not present

## 2017-07-15 DIAGNOSIS — D510 Vitamin B12 deficiency anemia due to intrinsic factor deficiency: Secondary | ICD-10-CM | POA: Diagnosis not present

## 2017-07-15 DIAGNOSIS — I890 Lymphedema, not elsewhere classified: Secondary | ICD-10-CM | POA: Diagnosis not present

## 2017-07-16 ENCOUNTER — Ambulatory Visit (INDEPENDENT_AMBULATORY_CARE_PROVIDER_SITE_OTHER): Payer: Medicare Other | Admitting: General Practice

## 2017-07-16 DIAGNOSIS — I82403 Acute embolism and thrombosis of unspecified deep veins of lower extremity, bilateral: Secondary | ICD-10-CM

## 2017-07-16 DIAGNOSIS — Z7901 Long term (current) use of anticoagulants: Secondary | ICD-10-CM

## 2017-07-16 DIAGNOSIS — I63311 Cerebral infarction due to thrombosis of right middle cerebral artery: Secondary | ICD-10-CM

## 2017-07-16 DIAGNOSIS — I2699 Other pulmonary embolism without acute cor pulmonale: Secondary | ICD-10-CM | POA: Diagnosis not present

## 2017-07-16 LAB — POCT INR: INR: 2.2 (ref 2.0–3.0)

## 2017-07-16 NOTE — Patient Instructions (Signed)
Pre visit review using our clinic review tool, if applicable. No additional management support is needed unless otherwise documented below in the visit note.  Continue to take 1/2 tablet daily except 1 tablet on Mondays/Wed and Fridays and Saturdays.  Re-check in 1 week. New dosing instructions given to April, RN @ [facility] while in the home.  [phone]

## 2017-07-18 NOTE — Patient Instructions (Addendum)
  Medications reviewed and updated.  No changes recommended at this time.  Your prescription(s) have been submitted to your pharmacy. Please take as directed and contact our office if you believe you are having problem(s) with the medication(s).    Please followup in 6 months   

## 2017-07-18 NOTE — Progress Notes (Signed)
Agree with management.  Sandy Kerney J Jenavee Laguardia, MD  

## 2017-07-18 NOTE — Progress Notes (Signed)
Subjective:    Patient ID: Sandy Barrett, female    DOB: Sep 03, 1932, 84 y.o.   MRN: [id number]  HPI The patient is here for follow up.  Recent UTI:  She is taking augmentin. She has three pills left.  Her daughter feels the urine does look better.  On 7/10 she had a near fall.  Her daughter was trying to adjust her in the chair and she slipped out of the chair and her daughter lowered her down to the floor.  There was no injuries.    Hypertension: She is taking her medication daily. She is compliant with a low sodium diet.   She is not exercising regularly.  She does not monitor her blood pressure at home.    Thrombocytosis: She is following with hematology/oncology.  H/o stroke with deficits of aphasia, spastic hemiparesis or right side h/o DVT:  She is on warfarin.  Migraine headaches: She has a long history of migraine headaches and is Taking propranolol for prevention.  She does have occasional headaches, but unsure if they are migraines secondary to her aphasia.  Hyperthyroidism, goiter: She is following with Dr. Cruzita Lederer.  She is still waiting to see if she can have the radioactive iodine treatment.  And needs to be approved by insurance.  Will require hospitalization.  She is doing well and her daughter has not pursued to see if this was approved by insurance or not.  Nighttime hypoxia;  She is using the oxygen at night.  She has seen pulmonary.    Bilateral leg edema: She is swelling in both feet and legs.  Overall this is stable.  Swelling worsens as the day progressives and she is sitting, but improves overnight when lying down.  Lymphedema: She has right breast and arm swelling secondary to lymphedema.  She is having lymphedema treatment by physical therapy at home-the swelling has improved significantly and the therapist thought it may have been related to positioning of the arm.  Her daughters are maintaining this at this point..  Medications and allergies reviewed with  patient and updated if appropriate.  Patient Active Problem List   Diagnosis Date Noted  . Bilateral leg edema 05/24/2017  . Cholecystitis 02/23/2017  . Chronic respiratory failure with hypoxia ([place]) 02/23/2017  . HCAP (healthcare-associated pneumonia) 02/12/2017  . CAP (community acquired pneumonia) 02/08/2017  . Neuralgia 01/09/2017  . Abnormal urine odor 10/28/2016  . Venous congestion 10/08/2016  . Lymphedema of breast 09/11/2016  . Long term (current) use of anticoagulants [Z79.01] 09/10/2016  . Left hip pain 08/09/2016  . Yeast infection of the skin 07/01/2016  . Left leg swelling 05/18/2016  . Pressure injury of skin 04/27/2016  . Aspiration pneumonia ([place]) 04/20/2016  . Hemidiaphragm paralysis   . Wheelchair bound 02/03/2016  . Excessive cerumen in both ear canals 12/31/2015  . Discoloration of skin 09/26/2015  . Essential thrombocytosis ([place]) 07/31/2015  . SVC (superior vena cava obstruction), chronic 07/20/2015  . Dysphagia 07/20/2015  . Constipation 07/20/2015  . Thrombophlebitis of breast, right 06/18/2015  . Tracheal deviation 06/06/2015  . Sensorineural hearing loss, bilateral, moderate-moderately severe 04/30/2015  . Abdominal wall lump 03/09/2015  . Frequent UTI 02/06/2015  . Primary osteoarthritis involving multiple joints 07/26/2014  . Pernicious anemia 07/26/2014  . Encounter for therapeutic drug monitoring 07/17/2014  . Spastic hemiparesis affecting dominant side ([place]), right 05/31/2014  . DVT of lower extremity, bilateral ([place]) 03/14/2014  . Global aphasia 03/14/2014  . 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  Aphasia S/P CVA 03/13/2014  . Cerebral infarction due to embolism of left middle cerebral artery ([place])   . Stroke, embolic ([place]) 08/18/4816  . History of pulmonary embolism 03/08/2014  . Primary localized osteoarthrosis, lower leg 03/06/2013  . IBS (irritable bowel syndrome)   . Multinodular goiter 01/13/2011  . Hyperthyroidism 11/11/2009  .  OBESITY 04/21/2008  . Osteoarthritis 04/21/2008  . Migraine headache 04/20/2008  . Essential hypertension 04/20/2008    Current Outpatient Medications on File Prior to Visit  Medication Sig Dispense Refill  . acetaminophen (TYLENOL) 160 MG/5ML liquid Take 480 mg by mouth every 4 (four) hours as needed for fever or pain.    [name] amoxicillin-clavulanate (AUGMENTIN) 875-125 MG tablet Take 1 tablet by mouth 2 (two) times daily. 14 tablet 0  . calamine lotion Apply 1 application as needed topically for itching. 120 mL 0  . diclofenac sodium (VOLTAREN) 1 % GEL APPLY (2GMS) TOPICALLY THREE TIMES DAILY. 300 g 5  . guaiFENesin (MUCINEX) 600 MG 12 hr tablet Take 600 mg by mouth 2 (two) times daily.    [name] guaiFENesin (ROBITUSSIN) 100 MG/5ML SOLN Take 5 mLs (100 mg total) by mouth 2 (two) times daily. 1200 mL 0  . hydroxyurea (HYDREA) 500 MG capsule Take 1 tablet (500 mg) Monday through Friday, take 2 tablets ( 1000 mg ) Saturday and Sunday 90 capsule 3  . ipratropium-albuterol (DUONEB) 0.5-2.5 (3) MG/3ML SOLN Take 3 mLs by nebulization every 6 (six) hours as needed. (Patient taking differently: Take 3 mLs by nebulization every 6 (six) hours as needed (shortness of breath). ) 360 mL 0  . lidocaine (LIDODERM) 5 % Place 1 patch daily onto the skin. Remove & Discard patch within 12 hours or as directed by MD (Patient taking differently: Place 1 patch onto the skin daily as needed (pain). Remove & Discard patch within 12 hours or as directed by MD) 15 patch 0  . methimazole (TAPAZOLE) 5 MG tablet TAKE 1 TABLET BY MOUTH TWICE WEEKLY 12 tablet 1  . NONFORMULARY OR COMPOUNDED ITEM Topical cream with equal parts diclofenac, gabapentin, lidocaine, menthol  Disp 100 gm 1 each 5  . Oral Hygiene Products (Q-CARE COVERD [place]/SUCTION) MISC Use as directed 10 each 5  . OXYGEN 2lpm 24/7  DME- [facility]    . polyethylene glycol (MIRALAX / GLYCOLAX) packet TAKE 17G BY MOUTH TWICE A DAY (Patient taking differently: TAKE 17G BY  MOUTH TWICE A DAY AS NEED FOR CONSTIPATION) 14 packet 1  . pregabalin (LYRICA) 25 MG capsule Take 1 capsule (25 mg total) by mouth 3 (three) times daily. 90 capsule 2  . Probiotic Product (ALIGN) 4 MG CAPS Take 4 mg by mouth daily.     . propranolol (INDERAL) 10 MG tablet Take 0.5 tablets (5 mg total) by mouth 2 (two) times daily. 60 tablet 5  . PROTONIX 40 MG PACK TAKE 20 MLS (40 MG TOTAL) BY MOUTH DAILY. 40 mL 5  . terconazole (TERAZOL 7) 0.4 % vaginal cream Place 1 applicator vaginally at bedtime. (Patient taking differently: Place 1 applicator vaginally at bedtime as needed (irritation). ) 90 g 3  . traMADol (ULTRAM) 50 MG tablet TAKE 2 TABLETS BY MOUTH 3 TIMES A DAY AS NEEDED 180 tablet 1  . traZODone (DESYREL) 50 MG tablet TAKE 1 TABLET (50 MG TOTAL) BY MOUTH AT BEDTIME AS NEEDED. FOR SLEEP 30 tablet 5  . triamcinolone ointment (KENALOG) 0.5 % Apply 1 application topically 2 (two) times daily. 30 g 0  .  warfarin (COUMADIN) 2 MG tablet Take as directed by anticoagulation clinic. (Patient taking differently: Take 2 mg by mouth See admin instructions. Take 2 mg every tues,wed, thurs, sat, & sun) 30 tablet 3  . warfarin (COUMADIN) 4 MG tablet TAKE AS DIRECTED BY ANTICOAGULATION CLINIC. (Patient taking differently: Take 4 mg by mouth every Friday and  Monday) 40 tablet 3   No current facility-administered medications on file prior to visit.     Past Medical History:  Diagnosis Date  . ARTHRITIS   . Arthritis   . Diverticulosis   . Essential thrombocytosis ([place])   . HYPERTENSION   . HYPERTHYROIDISM   . Hyperthyroidism    s/p I-131 ablation 03/2011 of multinod goiter  . INSOMNIA   . MIGRAINE HEADACHE   . OBESITY   . Posttraumatic stress disorder   . Pulmonary embolism ([place]) 03/2014   with DVT  . Stroke [facility]) 03/2014   dysarthria    Past Surgical History:  Procedure Laterality Date  . ABDOMINAL HYSTERECTOMY  1976  . BREAST SURGERY     biopsy  . RADIOLOGY WITH ANESTHESIA Left  03/08/2014   Procedure: RADIOLOGY WITH ANESTHESIA;  Surgeon: Rob Hickman, MD;  Location: [facility];  Service: Radiology;  Laterality: Left;  . TONSILLECTOMY    . TOTAL HIP ARTHROPLASTY  1998    right    Social History   Socioeconomic History  . Marital status: Widowed    Spouse name: Not on file  . Number of children: 2  . Years of education: 13  . Highest education level: Not on file  Occupational History  . Occupation: retired    Comment: Restaurant manager, fast food  Social Needs  . Financial resource strain: Not on file  . Food insecurity:    Worry: Not on file    Inability: Not on file  . Transportation needs:    Medical: Not on file    Non-medical: Not on file  Tobacco Use  . Smoking status: Never Smoker  . Smokeless tobacco: Never Used  Substance and Sexual Activity  . Alcohol use: No    Alcohol/week: 0.0 oz  . Drug use: No  . Sexual activity: Never  Lifestyle  . Physical activity:    Days per week: Not on file    Minutes per session: Not on file  . Stress: Not on file  Relationships  . Social connections:    Talks on phone: Not on file    Gets together: Not on file    Attends religious service: Not on file    Active member of club or organization: Not on file    Attends meetings of clubs or organizations: Not on file    Relationship status: Not on file  Other Topics Concern  . Not on file  Social History Narrative   Widowed, lived alone prior to CVA 03/2014   SNF at [facility], then home 07/2014 with dtr   Right handed   Caffeine use- occasionally drinks tea    Family History  Problem Relation Age of Onset  . Asthma Mother   . Asthma Father   . Prostate cancer Father   . Stroke Brother   . Breast cancer Sister   . Stomach cancer Sister   . Thyroid disease Neg Hx     Review of Systems  Constitutional: Negative for appetite change (good).  Cardiovascular: Positive for leg swelling (chronic, no change - improve at night).  Musculoskeletal:  Positive for arthralgias (knee pain).  Psychiatric/Behavioral:  Negative for sleep disturbance.       Objective:   Vitals:   07/19/17 1522  BP: 92/60  Pulse: 81  Resp: 16  Temp: 98.4 F (36.9 C)  SpO2: 92%   BP Readings from Last 3 Encounters:  07/19/17 92/60  05/24/17 (!) 80/50  05/10/17 110/80   Wt Readings from Last 3 Encounters:  04/20/17 176 lb (79.8 kg)  02/23/17 176 lb (79.8 kg)  02/13/17 169 lb 5 oz (76.8 kg)   There is no height or weight on file to calculate BMI.   Physical Exam    Constitutional: Appears well-developed and well-nourished. No distress.  HENT:  Head: Normocephalic and atraumatic.  Neck: Neck supple. No tracheal deviation present.  Mild subaortic notch thyromegaly present.  No cervical lymphadenopathy, vascular congestion from SVC obstruction more prominent on the right side Cardiovascular: Normal rate, regular rhythm and normal heart sounds.     2/6 systolic murmur.  1+ pitting bilateral lower extremity edema Pulmonary/Chest: Effort normal and breath sounds normal. No respiratory distress. No has no wheezes. No rales.  Skin: Skin is warm and dry. Not diaphoretic.  Psychiatric: Normal mood and affect. Behavior is normal.      Assessment & Plan:    See Problem List for Assessment and Plan of chronic medical problems.

## 2017-07-19 ENCOUNTER — Ambulatory Visit (INDEPENDENT_AMBULATORY_CARE_PROVIDER_SITE_OTHER): Payer: Medicare Other | Admitting: Internal Medicine

## 2017-07-19 ENCOUNTER — Encounter: Payer: Self-pay | Admitting: Internal Medicine

## 2017-07-19 VITALS — BP 92/60 | HR 81 | Temp 98.4°F | Resp 16

## 2017-07-19 DIAGNOSIS — I824Y3 Acute embolism and thrombosis of unspecified deep veins of proximal lower extremity, bilateral: Secondary | ICD-10-CM

## 2017-07-19 DIAGNOSIS — R600 Localized edema: Secondary | ICD-10-CM | POA: Diagnosis not present

## 2017-07-19 DIAGNOSIS — I10 Essential (primary) hypertension: Secondary | ICD-10-CM

## 2017-07-19 DIAGNOSIS — I890 Lymphedema, not elsewhere classified: Secondary | ICD-10-CM

## 2017-07-19 DIAGNOSIS — N390 Urinary tract infection, site not specified: Secondary | ICD-10-CM

## 2017-07-19 DIAGNOSIS — E0590 Thyrotoxicosis, unspecified without thyrotoxic crisis or storm: Secondary | ICD-10-CM

## 2017-07-19 DIAGNOSIS — G43809 Other migraine, not intractable, without status migrainosus: Secondary | ICD-10-CM | POA: Diagnosis not present

## 2017-07-19 DIAGNOSIS — M792 Neuralgia and neuritis, unspecified: Secondary | ICD-10-CM | POA: Diagnosis not present

## 2017-07-19 MED ORDER — CICLOPIROX 8 % EX SOLN: Freq: Every day | CUTANEOUS | 5 refills | Status: DC

## 2017-07-19 MED ORDER — PREGABALIN 25 MG PO CAPS: 25.0000 mg | ORAL_CAPSULE | Freq: Two times a day (BID) | ORAL | 2 refills | Status: DC

## 2017-07-19 NOTE — Assessment & Plan Note (Signed)
Continue warfarin Chronic edema stable

## 2017-07-19 NOTE — Assessment & Plan Note (Signed)
Chronic, stable 

## 2017-07-19 NOTE — Assessment & Plan Note (Signed)
Blood pressure on low side, but appears to be asymptomatic Will continue propranolol for now-this is prescribed for migraine prophylaxis and it will be difficult for [place] to know if she is having migraines or not.  She does have intermittent headaches Continue for now and monitor blood pressure No need for blood work-we will get blood work at her next visit

## 2017-07-19 NOTE — Assessment & Plan Note (Signed)
Completing Augmentin-has 3 pills left Her daughter will let me know if she has any concerning symptoms for another UTI

## 2017-07-19 NOTE — Assessment & Plan Note (Signed)
Following with endocrine Taking methimazole Stable

## 2017-07-19 NOTE — Assessment & Plan Note (Signed)
?    Controlled.  Has occasional headaches, but did not seem severe to her daughter We will continue propranolol for now as long as her blood pressure is okay

## 2017-07-19 NOTE — Assessment & Plan Note (Signed)
Related to stroke and shingles Taking Lyrica-currently taking it twice daily Pain seems to be controlled Continue

## 2017-07-19 NOTE — Assessment & Plan Note (Signed)
Completed lymphedema treatment by physical therapy-[facility] Back at baseline and daughters will continue the treatment on room

## 2017-07-20 ENCOUNTER — Other Ambulatory Visit: Payer: Self-pay

## 2017-07-20 ENCOUNTER — Ambulatory Visit (HOSPITAL_BASED_OUTPATIENT_CLINIC_OR_DEPARTMENT_OTHER): Payer: Medicare Other | Admitting: Physical Medicine & Rehabilitation

## 2017-07-20 ENCOUNTER — Encounter: Payer: Medicare Other | Attending: Physical Medicine & Rehabilitation

## 2017-07-20 VITALS — BP 110/76 | HR 93

## 2017-07-20 DIAGNOSIS — G8110 Spastic hemiplegia affecting unspecified side: Secondary | ICD-10-CM | POA: Diagnosis not present

## 2017-07-20 DIAGNOSIS — M1712 Unilateral primary osteoarthritis, left knee: Secondary | ICD-10-CM

## 2017-07-20 DIAGNOSIS — M7501 Adhesive capsulitis of right shoulder: Secondary | ICD-10-CM | POA: Diagnosis not present

## 2017-07-20 NOTE — Patient Instructions (Signed)
Non medication pain relief  Try ice 20min alternating with heating pad for 20 minutes every 2 hours as needed  Try TENs unit that can be puchased in a pharmacy for about 40.00, brands include Aleve or Icy Hot  Try various muscle cremes  Aspercreme or others that contain trolamine- this is anti inflammatory  Capsaicin creme which reduces Pain substance P  Lidocaine containing creme which has a numbing medicine  Methol and camphor containing creme which has a cooling effect 

## 2017-07-20 NOTE — Progress Notes (Addendum)
Subjective:    Patient ID: Sandy Barrett, female    DOB: Jul 30, 1932, 84 y.o.   MRN: [id number]  HPI 84 year old female with large left MCA infarct with right hemiparesis and aphasia.  She is wheelchair-bound.  She requires a [employer] fast streamer for transfers.  Her daughter notes that the patient has left-sided knee pain that is fairly constant.  It does not respond to Voltaren gel. RIght shoulder pain an issue with movement  Reviewed Dysport dosing, as well as prior injections Last Pectoralis injection was 03/2015 using Botox 125U to Right pectoralis  Pain Inventory Average Pain 5 Pain Right Now 7 My pain is intermittent, constant, sharp, dull, stabbing and aching  In the last 24 hours, has pain interfered with the following? General activity 7 Relation with others 7 Enjoyment of life 7 What TIME of day is your pain at its worst? n/a Sleep (in general) Good  Pain is worse with: bending, inactivity and standing Pain improves with: rest, therapy/exercise, medication and injections Relief from Meds: 5  Mobility ability to climb steps?  no do you drive?  no use a wheelchair  Function retired  Neuro/Psych bladder control problems bowel control problems weakness spasms  Prior Studies Any changes since last visit?  no  Physicians involved in your care Any changes since last visit?  no   Family History  Problem Relation Age of Onset  . Asthma Mother   . Asthma Father   . Prostate cancer Father   . Stroke Brother   . Breast cancer Sister   . Stomach cancer Sister   . Thyroid disease Neg Hx    Social History   Socioeconomic History  . Marital status: Widowed    Spouse name: Not on file  . Number of children: 2  . Years of education: 3  . Highest education level: Not on file  Occupational History  . Occupation: retired    Comment: Restaurant manager, fast food  Social Needs  . Financial resource strain: Not on file  . Food insecurity:    Worry: Not on file    Inability:  Not on file  . Transportation needs:    Medical: Not on file    Non-medical: Not on file  Tobacco Use  . Smoking status: Never Smoker  . Smokeless tobacco: Never Used  Substance and Sexual Activity  . Alcohol use: No    Alcohol/week: 0.0 oz  . Drug use: No  . Sexual activity: Never  Lifestyle  . Physical activity:    Days per week: Not on file    Minutes per session: Not on file  . Stress: Not on file  Relationships  . Social connections:    Talks on phone: Not on file    Gets together: Not on file    Attends religious service: Not on file    Active member of club or organization: Not on file    Attends meetings of clubs or organizations: Not on file    Relationship status: Not on file  Other Topics Concern  . Not on file  Social History Narrative   Widowed, lived alone prior to CVA 03/2014   SNF at [facility], then home 07/2014 with dtr   Right handed   Caffeine use- occasionally drinks tea   Past Surgical History:  Procedure Laterality Date  . ABDOMINAL HYSTERECTOMY  1976  . BREAST SURGERY     biopsy  . RADIOLOGY WITH ANESTHESIA Left 03/08/2014   Procedure: RADIOLOGY WITH ANESTHESIA;  Surgeon: Willaim Rayas  Stephani Police, MD;  Location: [facility];  Service: Radiology;  Laterality: Left;  . TONSILLECTOMY    . TOTAL HIP ARTHROPLASTY  1998    right   Past Medical History:  Diagnosis Date  . ARTHRITIS   . Arthritis   . Diverticulosis   . Essential thrombocytosis ([place])   . HYPERTENSION   . HYPERTHYROIDISM   . Hyperthyroidism    s/p I-131 ablation 03/2011 of multinod goiter  . INSOMNIA   . MIGRAINE HEADACHE   . OBESITY   . Posttraumatic stress disorder   . Pulmonary embolism ([place]) 03/2014   with DVT  . Stroke ([place]) 03/2014   dysarthria   BP 110/76   Pulse 93   SpO2 96%   Opioid Risk Score:   Fall Risk Score:  `1  Depression screen PHQ 2/9  Depression screen [facility] 2/9 07/20/2017 08/21/2016  Decreased Interest 0 0  Down, Depressed, Hopeless 0 0  PHQ - 2 Score 0 0   Some recent data might be hidden   Review of Systems  Constitutional: Negative.   HENT: Negative.   Eyes: Negative.   Respiratory: Negative.   Cardiovascular:       Limb swelling  Gastrointestinal: Negative.   Endocrine: Negative.   Genitourinary: Negative.   Musculoskeletal: Negative.   Skin: Positive for rash.  Allergic/Immunologic: Negative.   Neurological: Negative.   Hematological: Negative.   Psychiatric/Behavioral: Negative.   All other systems reviewed and are negative.      Objective:   Physical Exam  Constitutional: She appears well-developed and well-nourished.  Obese elderly female in NAD  HENT:  Head: Normocephalic and atraumatic.  Eyes: Pupils are equal, round, and reactive to light. EOM are normal.  Neurological: She is alert.  Nursing note and vitals reviewed.   Global aphasia No pain with RIght finger or hand or wrist ROM Mild pain with elbow ROM, moderate pain with RIght shoulder ROM Pateient also winces when RIght and Left knee ROM is performed Also winces during betadine prep of left knee  Tone  MAS 2 in right lumbricals,1 Biceps, 2-3 R Pecs, 0 R FPL, 2-3 R Pronators 0/5 strength in RUE and RLE    Assessment & Plan:  1.  Left MCA infarct with Right spastic  hemiparesis  Dysport in ~6wks Would add Pectoralis next visit and add pronator teres PQ200 PT100 Pectoralis 200  2.  Left knee> Right knee pain due to OA , will inject today  Knee injection   Indication:Right Knee pain not relieved by medication management and other conservative care.  Informed consent was obtained after describing risks and benefits of the procedure with the patient, this includes bleeding, bruising, infection and medication side effects. The patient wishes to proceed and has given written consent. The patient was placed in a recumbent position. The medial aspect of the knee was marked and prepped with Betadine and alcohol. It was then entered with a 25-gauge 1-1/2  inch needle was inserted into the knee joint. After negative draw back for blood, a solution containing one ML of 6mg  per mL betamethasone and 3 mL of 1% lidocaine were injected. The patient tolerated the procedure well. Post procedure instructions were given.  3.  Right shoulder pain, rotator cuff syndrome, injection 1 mo  4.  Long discussion with daughter about pain treatments including opiates, topicals  Over half of the 25 min visit was spent counseling and coordinating care. Pros and cons of buprenorphine Will treat locally to avoid sedation Consider RIght  shoulder PENS May need to increase Lyrica as tolerated

## 2017-07-22 ENCOUNTER — Ambulatory Visit (INDEPENDENT_AMBULATORY_CARE_PROVIDER_SITE_OTHER): Payer: Medicare Other | Admitting: General Practice

## 2017-07-22 DIAGNOSIS — I69320 Aphasia following cerebral infarction: Secondary | ICD-10-CM | POA: Diagnosis not present

## 2017-07-22 DIAGNOSIS — I69351 Hemiplegia and hemiparesis following cerebral infarction affecting right dominant side: Secondary | ICD-10-CM | POA: Diagnosis not present

## 2017-07-22 DIAGNOSIS — D510 Vitamin B12 deficiency anemia due to intrinsic factor deficiency: Secondary | ICD-10-CM | POA: Diagnosis not present

## 2017-07-22 DIAGNOSIS — M1991 Primary osteoarthritis, unspecified site: Secondary | ICD-10-CM | POA: Diagnosis not present

## 2017-07-22 DIAGNOSIS — I890 Lymphedema, not elsewhere classified: Secondary | ICD-10-CM | POA: Diagnosis not present

## 2017-07-22 DIAGNOSIS — Z7901 Long term (current) use of anticoagulants: Secondary | ICD-10-CM

## 2017-07-22 DIAGNOSIS — I10 Essential (primary) hypertension: Secondary | ICD-10-CM | POA: Diagnosis not present

## 2017-07-22 LAB — POCT INR: INR: 1.6 — AB (ref 2.0–3.0)

## 2017-07-22 NOTE — Patient Instructions (Signed)
Pre visit review using our clinic review tool, if applicable. No additional management support is needed unless otherwise documented below in the visit note.  Take 4 mg today, 6 mg tomorrow and then continue to take 1/2 tablet daily except 1 tablet on Mondays/Wed and Fridays and Saturdays.  Re-check in 1 week. New dosing instructions given to April, RN @ [facility] while in the home.  [phone].  Patient discharged from home health today.  Patient's daughter verbalized understanding of orders.

## 2017-07-27 ENCOUNTER — Telehealth: Payer: Self-pay | Admitting: Emergency Medicine

## 2017-07-27 NOTE — Telephone Encounter (Signed)
Patients daughter called and has question about the radioactive iodine. Can you please give her a call back to discuss this. Thanks.

## 2017-07-28 ENCOUNTER — Telehealth: Payer: Self-pay | Admitting: Nurse Practitioner

## 2017-07-28 NOTE — Telephone Encounter (Signed)
Pts daughter(Teresa) called stating that the pt has been experiencing light sensitivity. Requesting a call to discuss if this is something NP can handle or if she should reach out elsewhere

## 2017-07-28 NOTE — Telephone Encounter (Signed)
Spoke with daughter, Sandy Barrett on [place] and advised she call her mother's PCP or eye dr. [name] said the PCP recommended she see her eye dr. [name] said they did go to her eye dr, but due to her mother's aphasia she was unable to answer questions as the exam went along. This RN advised she see an opthalmologist if her current eye dr is not one. Sandy Barrett stated she was unsure and asked about recommendations. This RN gave her [facility]'s #.  Advised she explain her mother's aphasia to the personnel. She verbalized understanding, appreciation.

## 2017-07-28 NOTE — Telephone Encounter (Signed)
LMTCB

## 2017-07-29 NOTE — Telephone Encounter (Signed)
LMTCB

## 2017-07-30 NOTE — Telephone Encounter (Signed)
Unfortunately, no clear conclusion has been reached when I discussed with the nuclear medicine officer at that time.  The problem is that this is putting the staff at radiation exposure risk especially since the patient is not very mobile and needs help.  I think a referral to [facility] will probably be the best way to go about for now.  Would they want me to refer her to endocrinology there?  I would be happy to put this referral in.

## 2017-07-30 NOTE — Telephone Encounter (Signed)
Sandy Barrett called and stated that she would like to follow up with what had been discussed in 02/2017 about the radiation officer. She was also was told by Luetta Nutting at [facility] radiology stated she took this all the way up to the president and  that she could be referred to [place]? Please advise   Call back [phone]

## 2017-07-30 NOTE — Telephone Encounter (Signed)
LMTCB

## 2017-07-31 IMAGING — DX DG CHEST 1V
1 series · 1 of 1 positions shown · non-contrast
Comparison: Portable chest x-ray May 31, 2015 and chest CT scan
of May 31, 2015.

CLINICAL DATA: Chest congestion, cough, dysphagia for the past 5
days. History previous CVA, pulmonary embolism. Patient unable to
raise the chin above the chest.

EXAM:
CHEST 1 VIEW

[chest ap]
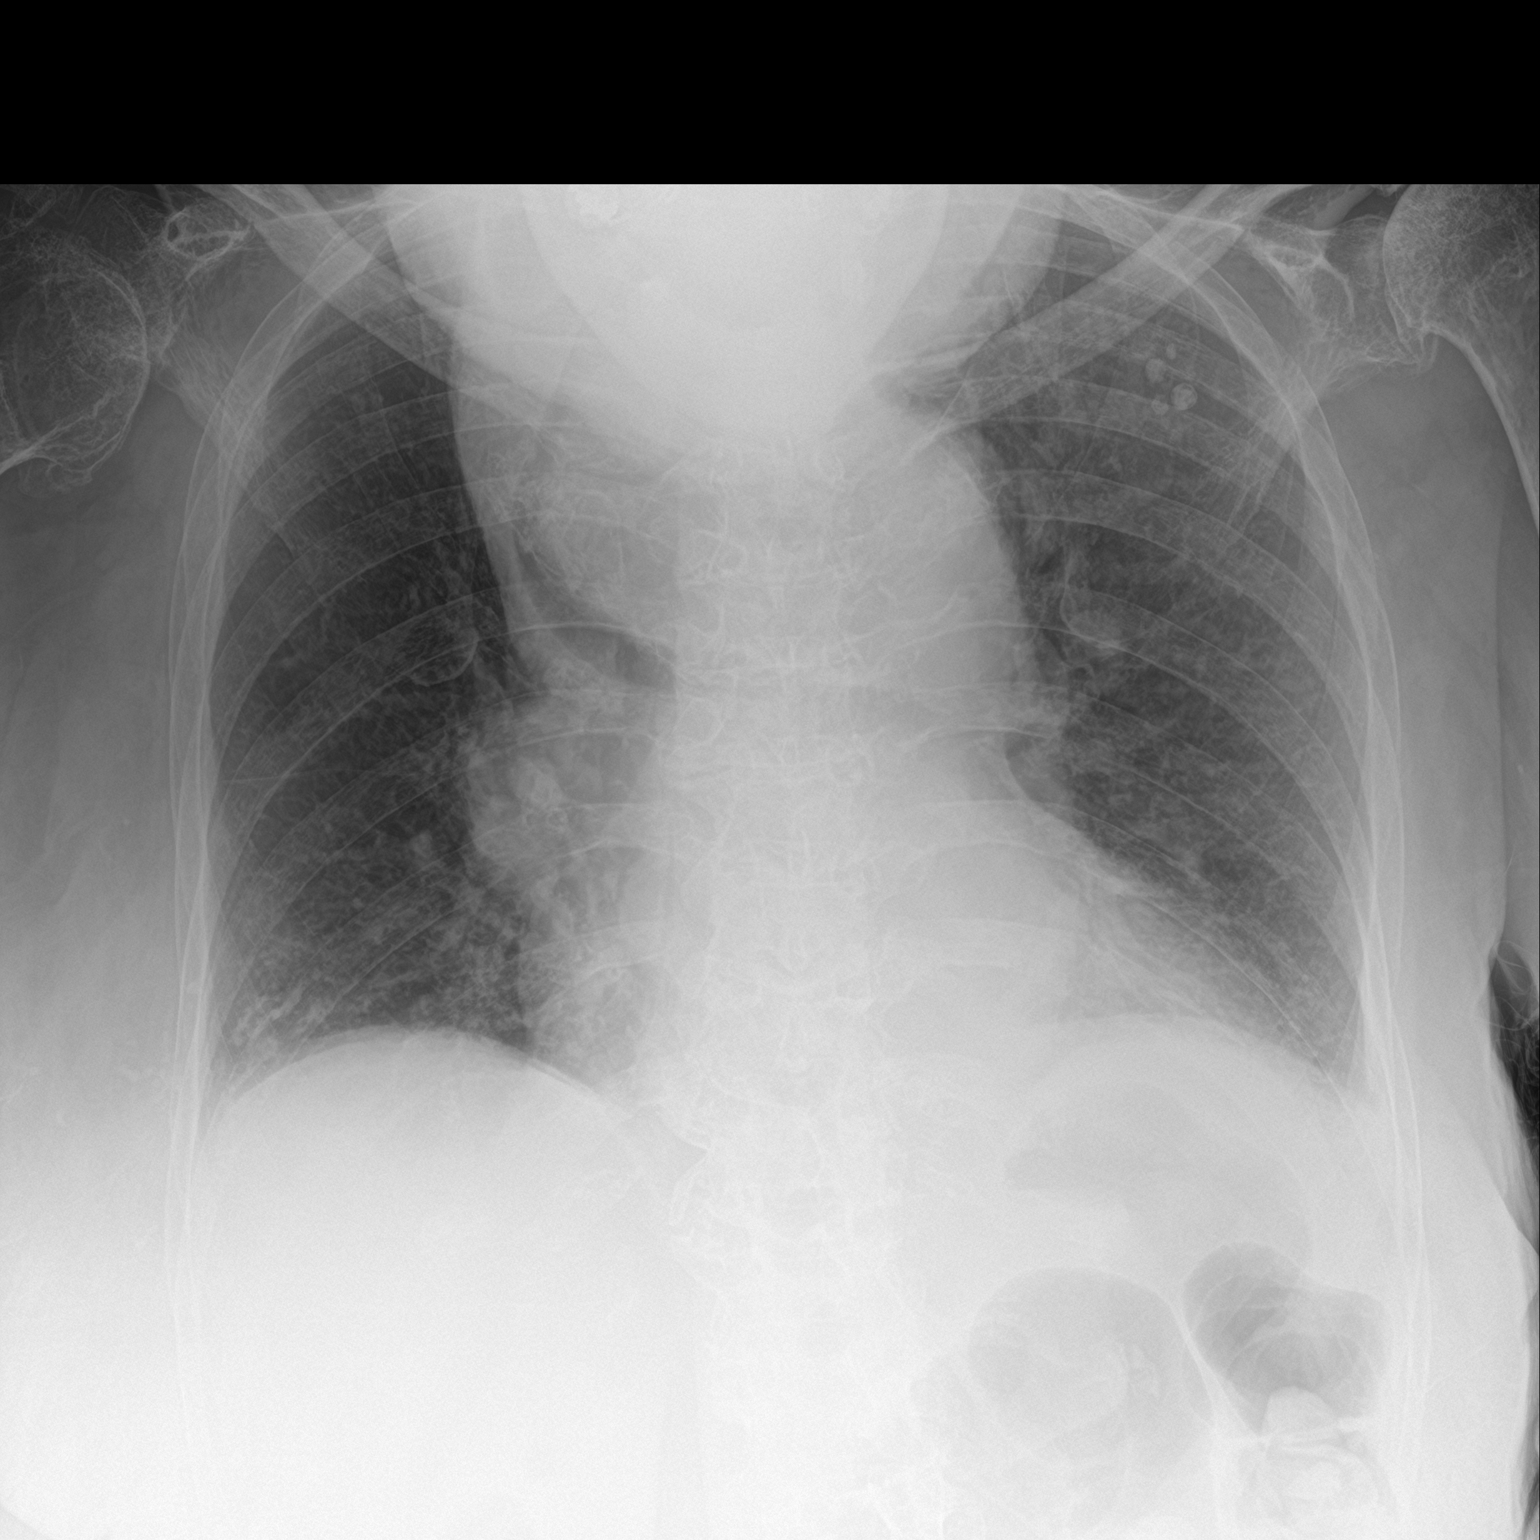

[1 of 1 positions shown; findings below may reference images not displayed]

FINDINGS: Again demonstrated is a large soft tissue mass which deviates the
trachea to the right most compatible with a goiter which is been
demonstrated on previous chest x-ray and CT scan. The lungs are
well-expanded. There are stable calcifications in the left upper
hemithorax which lie in the soft tissues superficial to the scapula
on the previous CT scan. The cardiac silhouette remains enlarged.
The pulmonary vascularity is not engorged. There is calcification in
the wall of the aortic arch. There is degenerative change of both
shoulders.
IMPRESSION: No acute pneumonia nor pulmonary edema.  Stable mild cardiomegaly.

Thoracic aortic atherosclerosis.

Large goiter with deviation of the trachea toward the right with
narrowing of the tracheal lumen. This has progressed somewhat since
the previous study.

## 2017-08-02 ENCOUNTER — Other Ambulatory Visit: Payer: Self-pay | Admitting: Internal Medicine

## 2017-08-02 DIAGNOSIS — E042 Nontoxic multinodular goiter: Secondary | ICD-10-CM

## 2017-08-02 NOTE — Telephone Encounter (Signed)
Unfortunately, I cannot order the RAI tx there myself, it has to be an inside order... I did place a referral for [facility] end[facility] for her this am, but we can cancel this if they decide not to go.

## 2017-08-02 NOTE — Telephone Encounter (Signed)
Daughter is aware 

## 2017-08-02 NOTE — Telephone Encounter (Signed)
Pt is wanting to know if she can just get the referral for the Radioactive Iodine, she said she doesn't really want to see another endocrinologist if it is not necessary

## 2017-08-06 ENCOUNTER — Telehealth: Payer: Self-pay | Admitting: Internal Medicine

## 2017-08-06 ENCOUNTER — Ambulatory Visit: Payer: Medicare Other

## 2017-08-06 NOTE — Telephone Encounter (Signed)
When the [facility] called regarding this, I did inform them that [name] had already left for the day.

## 2017-08-06 NOTE — Telephone Encounter (Signed)
Copied from [place] [id number]. Topic: Quick Communication - See Telephone Encounter >> Aug 06, 2017  3:11 PM Rutherford Nail, NT wrote: CRM for notification. See Telephone encounter for: 08/06/17. Patient's daughter calling to speak to [name] in the coumadin clinic. Did not say what it was regarding. Please advise. CB#: [phone]

## 2017-08-09 NOTE — Telephone Encounter (Signed)
Attempted to return call.  Left a VM message.

## 2017-08-10 ENCOUNTER — Other Ambulatory Visit: Payer: Self-pay | Admitting: Hematology and Oncology

## 2017-08-10 ENCOUNTER — Telehealth: Payer: Self-pay | Admitting: *Deleted

## 2017-08-10 ENCOUNTER — Other Ambulatory Visit: Payer: Self-pay | Admitting: Physical Medicine & Rehabilitation

## 2017-08-10 DIAGNOSIS — D473 Essential (hemorrhagic) thrombocythemia: Secondary | ICD-10-CM

## 2017-08-10 NOTE — Telephone Encounter (Signed)
-----   Message from Heath Lark, MD sent at 08/10/2017 10:33 AM EDT ----- Regarding: hydrea refill I received a request for medication refill She has not have CBC checked in 3 months. Previously, I spoke with the daughter who plans to get PCP to check. Ask if that is still feasible? I rather not refill until another CBC is done If she cannot get PCP to check it, then we will bring her here for CBC recheck and 15 mins visit appt

## 2017-08-10 NOTE — Telephone Encounter (Signed)
I put order under [facility]; hopefully no problem Since her lab appt is late and I might be gone, if her daughter did not hear from [place] by end of Friday, make sure she calls on Monday to follow-up on result and medication refill

## 2017-08-10 NOTE — Telephone Encounter (Signed)
I told them to increase the dose to 25 3 times daily as per my note.  She already had a prescription.  We can certainly take this over and call in 25 mg 3 times daily #90,1 refill

## 2017-08-10 NOTE — Telephone Encounter (Signed)
Pharmacy says we rejected the refill request for lyrica and Sandy Barrett is asking why.  I rejected because I saw that Dr Quay Burow had refilled on 07/19/17 but Ms Asebedo says Dr Quay Burow ordered bid and Dr Letta Pate said to take 25 mg tid.  Is this the correct dosing so I can reorder?

## 2017-08-10 NOTE — Telephone Encounter (Signed)
Spoke with daughter, Sandy Barrett. Ms Magno has an appointment this Friday at the [facility].  Wants to know if Dr Alvy Bimler will order a CBC to be drawn at that time. She will make sure lab draws it. States patient has enough hydrea to last her this week.

## 2017-08-10 NOTE — Telephone Encounter (Signed)
To be renewed after test result

## 2017-08-11 MED ORDER — PREGABALIN 25 MG PO CAPS: 25.0000 mg | ORAL_CAPSULE | Freq: Three times a day (TID) | ORAL | 1 refills | Status: DC

## 2017-08-11 NOTE — Telephone Encounter (Signed)
New order called to pharmacy and I let them know Dr Letta Pate will be prescribing.

## 2017-08-12 ENCOUNTER — Telehealth: Payer: Self-pay | Admitting: Emergency Medicine

## 2017-08-12 NOTE — Telephone Encounter (Signed)
Pts daughter called and states that pt is going to get blood work done tomorrow while having coumadin checked. Does she need any other labs done at the same time?

## 2017-08-12 NOTE — Telephone Encounter (Signed)
No, I do not think there is anything else she needs.  Last blood work done in May.

## 2017-08-13 ENCOUNTER — Ambulatory Visit (INDEPENDENT_AMBULATORY_CARE_PROVIDER_SITE_OTHER): Payer: Medicare Other | Admitting: General Practice

## 2017-08-13 ENCOUNTER — Other Ambulatory Visit: Payer: Medicare Other

## 2017-08-13 DIAGNOSIS — Z7901 Long term (current) use of anticoagulants: Secondary | ICD-10-CM

## 2017-08-13 LAB — POCT INR: INR: 1.6 — AB (ref 2.0–3.0)

## 2017-08-13 NOTE — Patient Instructions (Addendum)
Pre visit review using our clinic review tool, if applicable. No additional management support is needed unless otherwise documented below in the visit note.  Take 1 1/2 tablets  today and tomorrow and then take 1 tablet every day except 1/2 tablet on sundays and Thursdays.  Re-check in 10 to 14 days.

## 2017-08-13 NOTE — Telephone Encounter (Signed)
Tried contacting pt's daughter. Unable to LVM.

## 2017-08-16 ENCOUNTER — Telehealth: Payer: Self-pay | Admitting: Hematology and Oncology

## 2017-08-16 NOTE — Telephone Encounter (Signed)
Spoke to patients daughter regarding upcoming aug appts per 8/12 sch message

## 2017-08-17 ENCOUNTER — Encounter: Payer: Self-pay | Admitting: Physical Medicine & Rehabilitation

## 2017-08-17 ENCOUNTER — Encounter: Payer: Medicare Other | Attending: Physical Medicine & Rehabilitation

## 2017-08-17 ENCOUNTER — Ambulatory Visit (HOSPITAL_BASED_OUTPATIENT_CLINIC_OR_DEPARTMENT_OTHER): Payer: Medicare Other | Admitting: Physical Medicine & Rehabilitation

## 2017-08-17 ENCOUNTER — Other Ambulatory Visit: Payer: Self-pay

## 2017-08-17 VITALS — BP 117/80 | HR 86

## 2017-08-17 DIAGNOSIS — M7501 Adhesive capsulitis of right shoulder: Secondary | ICD-10-CM | POA: Diagnosis not present

## 2017-08-17 DIAGNOSIS — G8110 Spastic hemiplegia affecting unspecified side: Secondary | ICD-10-CM | POA: Diagnosis not present

## 2017-08-17 NOTE — Progress Notes (Signed)
Shoulder injection Right glenohumeral   Indication:RIght Shoulder pain not relieved by medication management and other conservative care.  Informed consent was obtained after describing risks and benefits of the procedure with the patient, this includes bleeding, bruising, infection and medication side effects. The patient wishes to proceed and has given written consent. Patient was placed in a seated position. TheRight shoulder was marked and prepped with betadine in the subacromial area. A 25-gauge 1-1/2 inch needle was inserted into the subacromial area. After negative draw back for blood, a solution containing 1 mL of 6 mg per ML betamethasone and 4 mL of 1% lidocaine was injected. A band aid was applied. The patient tolerated the procedure well. Post procedure instructions were given.

## 2017-08-17 NOTE — Patient Instructions (Addendum)
If shoulder injection is not effective would start Gabapentin

## 2017-08-18 ENCOUNTER — Other Ambulatory Visit: Payer: Self-pay | Admitting: Hematology

## 2017-08-18 ENCOUNTER — Inpatient Hospital Stay: Payer: Medicare Other | Attending: Hematology and Oncology

## 2017-08-18 ENCOUNTER — Telehealth: Payer: Self-pay

## 2017-08-18 DIAGNOSIS — D473 Essential (hemorrhagic) thrombocythemia: Secondary | ICD-10-CM | POA: Diagnosis not present

## 2017-08-18 LAB — CBC WITH DIFFERENTIAL/PLATELET
Basophils Absolute: 0 10*3/uL (ref 0.0–0.1)
Basophils Relative: 0 %
Eosinophils Absolute: 0 10*3/uL (ref 0.0–0.5)
Eosinophils Relative: 0 %
HCT: 41.1 % (ref 34.8–46.6)
Hemoglobin: 13.2 g/dL (ref 11.6–15.9)
Lymphocytes Relative: 9 %
Lymphs Abs: 0.8 10*3/uL — ABNORMAL LOW (ref 0.9–3.3)
MCH: 37.1 pg — ABNORMAL HIGH (ref 25.1–34.0)
MCHC: 32.1 g/dL (ref 31.5–36.0)
MCV: 115.4 fL — ABNORMAL HIGH (ref 79.5–101.0)
Monocytes Absolute: 0.3 10*3/uL (ref 0.1–0.9)
Monocytes Relative: 3 %
Neutro Abs: 7.6 10*3/uL — ABNORMAL HIGH (ref 1.5–6.5)
Neutrophils Relative %: 88 %
Platelets: 513 10*3/uL — ABNORMAL HIGH (ref 145–400)
RBC: 3.56 MIL/uL — ABNORMAL LOW (ref 3.70–5.45)
RDW: 15 % — ABNORMAL HIGH (ref 11.2–14.5)
WBC: 8.7 10*3/uL (ref 3.9–10.3)

## 2017-08-18 MED ORDER — HYDROXYUREA 500 MG PO CAPS: ORAL_CAPSULE | ORAL | 3 refills | Status: DC

## 2017-08-18 NOTE — Telephone Encounter (Signed)
Dr. Alvy Bimler is out of office this week, and her nurse Hassan Rowan asked me to refill her hydrea. Her CBC today showed plt 513, it was 422 3 months ago and normal in 5 months ago. Hassan Rowan is certain that she is complaint with hydrea. So I increased her hydrea by 1 tab/week (she will be 500mg  daily except 1000mg  on Wednesday, Saturday and Sunday). I sent a message to Dr. Alvy Bimler and she will f/u. Hassan Rowan called pt and discussed the change of her hydrea.   Truitt Merle  08/18/2017

## 2017-08-18 NOTE — Telephone Encounter (Signed)
Called and left message. Rx sent to pharmacy for [facility], directions have changed to take 2 tabs (1,000mg ) on Wednesday/Saturday and Sunday and take 1 tablet (500mg ) the rest of the week. Instructed to call office for questions.

## 2017-08-23 ENCOUNTER — Telehealth: Payer: Self-pay

## 2017-08-23 NOTE — Telephone Encounter (Signed)
Called daughter and given below message. She verbalized understanding. Scheduling message sent for appt on 9/6 in the afternoon.

## 2017-08-23 NOTE — Telephone Encounter (Signed)
-----   Message from Heath Lark, MD sent at 08/23/2017  8:03 AM EDT ----- Regarding: labs Dr. Burr Medico increased her Hydrea last week. I need to see her back in about 3 weeks. Can you call daughter and ask how she is doing and whether she has a preferred time/date? Send scheduling msg pls

## 2017-08-24 ENCOUNTER — Telehealth: Payer: Self-pay | Admitting: Hematology and Oncology

## 2017-08-24 NOTE — Telephone Encounter (Signed)
Left message for patients daughter regarding upcoming appts per 8/19 sch message

## 2017-08-27 ENCOUNTER — Ambulatory Visit (INDEPENDENT_AMBULATORY_CARE_PROVIDER_SITE_OTHER): Payer: Medicare Other | Admitting: General Practice

## 2017-08-27 DIAGNOSIS — Z7901 Long term (current) use of anticoagulants: Secondary | ICD-10-CM | POA: Diagnosis not present

## 2017-08-27 DIAGNOSIS — I82503 Chronic embolism and thrombosis of unspecified deep veins of lower extremity, bilateral: Secondary | ICD-10-CM

## 2017-08-27 LAB — POCT INR: INR: 3.2 — AB (ref 2.0–3.0)

## 2017-08-27 NOTE — Patient Instructions (Addendum)
Pre visit review using our clinic review tool, if applicable. No additional management support is needed unless otherwise documented below in the visit note.  Hold coumadin today (8/23) and then continue to take 1 tablet every day except 1/2 tablet on Sundays and Thursdays.  Re-check in 4 weeks.

## 2017-08-27 NOTE — Progress Notes (Signed)
Agree with management.  Stacy J Burns, MD  

## 2017-09-05 ENCOUNTER — Other Ambulatory Visit: Payer: Self-pay | Admitting: Internal Medicine

## 2017-09-07 NOTE — Telephone Encounter (Signed)
 Controlled Substance Database checked. Last filled on 07/31/17

## 2017-09-08 ENCOUNTER — Telehealth: Payer: Self-pay | Admitting: *Deleted

## 2017-09-08 ENCOUNTER — Telehealth: Payer: Self-pay

## 2017-09-08 NOTE — Telephone Encounter (Signed)
Spoke with patient daughter concerning her appointment on 9/6. Transferred call to [facility] to confirm appointment to remain or be canceled. Patient was unsure if this appointment was needed since she had a lab on 8/14 and felt that a lab and f/u was not needed for Friday. Per 9/4 voice mail return call

## 2017-09-08 NOTE — Telephone Encounter (Signed)
It was scheduled because of recent Hydrea dose adjustment. Her platelet count was worse and when Dr. Burr Medico was covering she increased the Hydrea dose

## 2017-09-08 NOTE — Telephone Encounter (Signed)
Received call from pt's daughter, [name] stating that pt has an appt that she thinks is old scheduled for this Friday & wants to make sure she can cancel.  Her mother has an appt in November for lab/MD.  She came in early for lab in August due to needing a refill on on her Hydrea.  Message to Dr Alvy Bimler to verify cancel for 09/10/17.

## 2017-09-08 NOTE — Telephone Encounter (Signed)
Returned call to pt's daughter & encouraged to keep appt 09/10/17 due to increase in dosage of hydrea last visit.  Daughter, [name] expressed understanding.

## 2017-09-10 ENCOUNTER — Telehealth: Payer: Self-pay | Admitting: Hematology and Oncology

## 2017-09-10 ENCOUNTER — Encounter: Payer: Self-pay | Admitting: Hematology and Oncology

## 2017-09-10 ENCOUNTER — Inpatient Hospital Stay: Payer: Medicare Other | Attending: Hematology and Oncology

## 2017-09-10 ENCOUNTER — Inpatient Hospital Stay (HOSPITAL_BASED_OUTPATIENT_CLINIC_OR_DEPARTMENT_OTHER): Payer: Medicare Other | Admitting: Hematology and Oncology

## 2017-09-10 VITALS — BP 104/61 | HR 88 | Temp 97.8°F | Resp 18

## 2017-09-10 DIAGNOSIS — Z86711 Personal history of pulmonary embolism: Secondary | ICD-10-CM | POA: Insufficient documentation

## 2017-09-10 DIAGNOSIS — G8110 Spastic hemiplegia affecting unspecified side: Secondary | ICD-10-CM

## 2017-09-10 DIAGNOSIS — D473 Essential (hemorrhagic) thrombocythemia: Secondary | ICD-10-CM | POA: Diagnosis not present

## 2017-09-10 DIAGNOSIS — Z7901 Long term (current) use of anticoagulants: Secondary | ICD-10-CM | POA: Diagnosis not present

## 2017-09-10 LAB — CBC WITH DIFFERENTIAL/PLATELET
Basophils Absolute: 0.1 10*3/uL (ref 0.0–0.1)
Basophils Relative: 1 %
Eosinophils Absolute: 0.1 10*3/uL (ref 0.0–0.5)
Eosinophils Relative: 2 %
HCT: 41.5 % (ref 34.8–46.6)
Hemoglobin: 13.2 g/dL (ref 11.6–15.9)
Lymphocytes Relative: 16 %
Lymphs Abs: 0.9 10*3/uL (ref 0.9–3.3)
MCH: 37.1 pg — ABNORMAL HIGH (ref 25.1–34.0)
MCHC: 31.8 g/dL (ref 31.5–36.0)
MCV: 116.5 fL — ABNORMAL HIGH (ref 79.5–101.0)
Monocytes Absolute: 0.3 10*3/uL (ref 0.1–0.9)
Monocytes Relative: 6 %
Neutro Abs: 4.2 10*3/uL (ref 1.5–6.5)
Neutrophils Relative %: 75 %
Platelets: 468 10*3/uL — ABNORMAL HIGH (ref 145–400)
RBC: 3.56 MIL/uL — ABNORMAL LOW (ref 3.70–5.45)
RDW: 16.4 % — ABNORMAL HIGH (ref 11.2–14.5)
WBC: 5.6 10*3/uL (ref 3.9–10.3)

## 2017-09-10 NOTE — Assessment & Plan Note (Signed)
She has poor mobility with right-sided weakness and chronic swelling and expressive dysphasia Her daughter takes good care of her I will try to minimize her traveling time back to the clinic to get her blood count checked in the future if possible.

## 2017-09-10 NOTE — Progress Notes (Signed)
[facility] OFFICE PROGRESS NOTE  Patient Care Team: [name], MD as PCP - General (Internal Medicine) Eunice Blase, MD as Consulting Physician (Family Medicine) Renato Shin, MD as Consulting Physician (Endocrinology) Netta Cedars, MD as Consulting Physician (Orthopedic Surgery) Lyndal Pulley, DO (Sports Medicine) Garvin Fila, MD (Neurology)  ASSESSMENT & PLAN:  Essential thrombocytosis [facility]) She had intermittent high platelet count and the dose of hydroxyurea was adjusted recently For now, she will take 1000 mg 3 times a week and 500 mg the rest of the week With improvement of her platelet count control, I plan to recheck her lab work again in 6 weeks  History of pulmonary embolism Due to her high risk disease, she will continue anticoagulation therapy indefinitely She will continue INR monitoring through her primary care doctor. When she returns in 6 weeks, I can draw her PT/INR to save her  a trip  Spastic hemiparesis affecting dominant side ([place]), right She has poor mobility with right-sided weakness and chronic swelling and expressive dysphasia Her daughter takes good care of her I will try to minimize her traveling time back to the clinic to get her blood count checked in the future if possible.   Orders Placed This Encounter  Procedures  . Protime-INR    Standing Status:   Standing    Number of Occurrences:   9    Standing Expiration Date:   09/11/2018    INTERVAL HISTORY: Please see below for problem oriented charting. She returns for further follow-up with her daughter According to the daughter, she is taking her medications as instructed The patient denies any recent signs or symptoms of bleeding such as spontaneous epistaxis, hematuria or hematochezia. There were no reported recent infection  SUMMARY OF ONCOLOGIC HISTORY:   Essential thrombocytosis ([place])   08/07/2013 Miscellaneous    The patient is noted to have elevated platelet  count    03/08/2014 Imaging    Positive for acute PE with CT evidence of right heart strain (RV/LV Ratio = 0.9) consistent with at least submassive (intermediate risk)PE.     03/09/2014 Imaging    US venous Doppler showed deep vein thrombosis noted in the right distal common femoral vein, femoral vein, and popliteal vein. DVT noted in the left femoral and popliteal veins    03/09/2014 Imaging    Patchy areas of acute left MCA territory infarction. 2. Several punctate foci of acute right MCA territory infarction. Possible trace subarachnoid hemorrhage in the high right frontal lobe. 3. Occluded left ICA and left MCA    03/20/2015 Imaging    Compared to MRI on 03/09/14, there has been expected evolutional change of left MCA infarction. In addition, there may be a few areas of acute-subacute infarcts in the left basal ganglia vs artifact.      07/31/2015 Pathology Results    Peripheral blood is positive for JAK2 mutation    07/31/2015 -  Chemotherapy    She is started on 500 mg daily Hydrea    08/15/2015 Miscellaneous    The dose of hydroxyurea is increased to 1000 mg daily    09/26/2015 Miscellaneous    Dose of Hydrea to 500 mg daily except on Saturdays and Sundays she takes 1000 mg.    10/25/2015 Adverse Reaction    Dose of Hydrea is reduced to 500 mg daily     10 /04/2016 Miscellaneous    From 2017 to 2018, the dose of Hydrea is further adjusted.     REVIEW  OF SYSTEMS:  All other systems were reviewed with the patient and are negative.  I have reviewed the past medical history, past surgical history, social history and family history with the patient and they are unchanged from previous note.  ALLERGIES:  is allergic to valtrex [valacyclovir hcl].  MEDICATIONS:  Current Outpatient Medications  Medication Sig Dispense Refill  . acetaminophen (TYLENOL) 160 MG/5ML liquid Take 480 mg by mouth every 4 (four) hours as needed for fever or pain.    . calamine lotion Apply 1 application as  needed topically for itching. 120 mL 0  . ciclopirox (PENLAC) 8 % solution Apply topically at bedtime. Apply over nail & surrounding skin. Apply Qd over previous coat. After 7 days, remove w alcohol, continue cycle. 6.6 mL 5  . diclofenac sodium (VOLTAREN) 1 % GEL APPLY (2GMS) TOPICALLY THREE TIMES DAILY. 300 g 5  . hydroxyurea (HYDREA) 500 MG capsule Take 1 tablet (500 mg) daily except 2 tablets ( 1000 mg ) on Wednesday, Saturday and Sunday 100 capsule 3  . ipratropium-albuterol (DUONEB) 0.5-2.5 (3) MG/3ML SOLN Take 3 mLs by nebulization every 6 (six) hours as needed. (Patient taking differently: Take 3 mLs by nebulization every 6 (six) hours as needed (shortness of breath). ) 360 mL 0  . lidocaine (LIDODERM) 5 % Place 1 patch daily onto the skin. Remove & Discard patch within 12 hours or as directed by MD (Patient taking differently: Place 1 patch onto the skin daily as needed (pain). Remove & Discard patch within 12 hours or as directed by MD) 15 patch 0  . methimazole (TAPAZOLE) 5 MG tablet TAKE 1 TABLET BY MOUTH TWICE WEEKLY 12 tablet 1  . NONFORMULARY OR COMPOUNDED ITEM Topical cream with equal parts diclofenac, gabapentin, lidocaine, menthol  Disp 100 gm 1 each 5  . Oral Hygiene Products (Q-CARE COVERD [place]/SUCTION) MISC Use as directed 10 each 5  . OXYGEN 2lpm 24/7  DME- [facility]    . polyethylene glycol (MIRALAX / GLYCOLAX) packet TAKE 17G BY MOUTH TWICE A DAY (Patient taking differently: TAKE 17G BY MOUTH TWICE A DAY AS NEED FOR CONSTIPATION) 14 packet 1  . pregabalin (LYRICA) 25 MG capsule Take 1 capsule (25 mg total) by mouth 3 (three) times daily. 90 capsule 1  . Probiotic Product (ALIGN) 4 MG CAPS Take 4 mg by mouth daily.     . propranolol (INDERAL) 10 MG tablet Take 0.5 tablets (5 mg total) by mouth 2 (two) times daily. 60 tablet 5  . PROTONIX 40 MG PACK TAKE 20 MLS (40 MG TOTAL) BY MOUTH DAILY. 40 mL 5  . terconazole (TERAZOL 7) 0.4 % vaginal cream Place 1 applicator vaginally at  bedtime. (Patient taking differently: Place 1 applicator vaginally at bedtime as needed (irritation). ) 90 g 3  . traMADol (ULTRAM) 50 MG tablet TAKE 2 TABLETS BY MOUTH 3 TIMES A DAY AS NEEDED 180 tablet 1  . traZODone (DESYREL) 50 MG tablet TAKE 1 TABLET (50 MG TOTAL) BY MOUTH AT BEDTIME AS NEEDED. FOR SLEEP 30 tablet 5  . triamcinolone ointment (KENALOG) 0.5 % Apply 1 application topically 2 (two) times daily. 30 g 0  . warfarin (COUMADIN) 2 MG tablet Take as directed by anticoagulation clinic. (Patient taking differently: Take 2 mg by mouth See admin instructions. Take 2 mg every tues,wed, thurs, sat, & sun) 30 tablet 3  . warfarin (COUMADIN) 4 MG tablet TAKE AS DIRECTED BY ANTICOAGULATION CLINIC. (Patient taking differently: Take 4 mg by mouth every Friday  and  Monday) 40 tablet 3   No current facility-administered medications for this visit.     PHYSICAL EXAMINATION: ECOG PERFORMANCE STATUS: 2 - Symptomatic, <50% confined to bed  Vitals:   09/10/17 1534  BP: 104/61  Pulse: 88  Resp: 18  Temp: 97.8 F (36.6 C)  SpO2: 95%   There were no vitals filed for this visit.  GENERAL:alert, no distress and comfortable.  She is sitting on the wheelchair SKIN: skin color, texture, turgor are normal, no rashes or significant lesions EYES: normal, Conjunctiva are pink and non-injected, sclera clear OROPHARYNX:no exudate, no erythema and lips, buccal mucosa, and tongue normal  NECK: supple, thyroid normal size, non-tender, without nodularity LYMPH:  no palpable lymphadenopathy in the cervical, axillary or inguinal LUNGS: clear to auscultation and percussion with normal breathing effort HEART: regular rate & rhythm and no murmurs and no lower extremity edema ABDOMEN:abdomen soft, non-tender and normal bowel sounds Musculoskeletal:no cyanosis of digits and no clubbing  NEURO: alert & oriented x 3 with expressive dysphasia and right sided swelling  LABORATORY DATA:  I have reviewed the data as  listed    Component Value Date/Time   NA 142 05/24/2017 1625   NA 142 10/06/2016 1352   K 4.1 05/24/2017 1625   K 4.1 10/06/2016 1352   CL 106 05/24/2017 1625   CO2 28 05/24/2017 1625   CO2 27 10/06/2016 1352   GLUCOSE 79 05/24/2017 1625   GLUCOSE 90 10/06/2016 1352   BUN 13 05/24/2017 1625   BUN 12.9 10/06/2016 1352   CREATININE 0.31 (L) 05/24/2017 1625   CREATININE 0.6 10/06/2016 1352   CALCIUM 8.8 05/24/2017 1625   CALCIUM 9.4 10/06/2016 1352   PROT 6.5 05/24/2017 1625   ALBUMIN 3.3 (L) 05/24/2017 1625   AST 10 05/24/2017 1625   ALT 5 05/24/2017 1625   ALKPHOS 57 05/24/2017 1625   BILITOT 0.7 05/24/2017 1625   GFRNONAA >60 02/17/2017 0450   GFRAA >60 02/17/2017 0450    No results found for: SPEP, UPEP  Lab Results  Component Value Date   WBC 5.6 09/10/2017   NEUTROABS 4.2 09/10/2017   HGB 13.2 09/10/2017   HCT 41.5 09/10/2017   MCV 116.5 (H) 09/10/2017   PLT 468 (H) 09/10/2017      Chemistry      Component Value Date/Time   NA 142 05/24/2017 1625   NA 142 10/06/2016 1352   K 4.1 05/24/2017 1625   K 4.1 10/06/2016 1352   CL 106 05/24/2017 1625   CO2 28 05/24/2017 1625   CO2 27 10/06/2016 1352   BUN 13 05/24/2017 1625   BUN 12.9 10/06/2016 1352   CREATININE 0.31 (L) 05/24/2017 1625   CREATININE 0.6 10/06/2016 1352      Component Value Date/Time   CALCIUM 8.8 05/24/2017 1625   CALCIUM 9.4 10/06/2016 1352   ALKPHOS 57 05/24/2017 1625   AST 10 05/24/2017 1625   ALT 5 05/24/2017 1625   BILITOT 0.7 05/24/2017 1625       All questions were answered. The patient knows to call the clinic with any problems, questions or concerns. No barriers to learning was detected.  I spent 10 minutes counseling the patient face to face. The total time spent in the appointment was 15 minutes and more than 50% was on counseling and review of test results  Heath Lark, MD 09/10/2017 3:49 PM

## 2017-09-10 NOTE — Assessment & Plan Note (Signed)
Due to her high risk disease, she will continue anticoagulation therapy indefinitely She will continue INR monitoring through her primary care doctor. When she returns in 6 weeks, I can draw her PT/INR to save her  a trip

## 2017-09-10 NOTE — Assessment & Plan Note (Signed)
She had intermittent high platelet count and the dose of hydroxyurea was adjusted recently For now, she will take 1000 mg 3 times a week and 500 mg the rest of the week With improvement of her platelet count control, I plan to recheck her lab work again in 6 weeks

## 2017-09-10 NOTE — Telephone Encounter (Signed)
Gave pt avs and calendar  °

## 2017-09-17 ENCOUNTER — Encounter: Payer: Self-pay | Admitting: Physical Medicine & Rehabilitation

## 2017-09-17 ENCOUNTER — Encounter: Payer: Medicare Other | Attending: Physical Medicine & Rehabilitation

## 2017-09-17 ENCOUNTER — Ambulatory Visit (HOSPITAL_BASED_OUTPATIENT_CLINIC_OR_DEPARTMENT_OTHER): Payer: Medicare Other | Admitting: Physical Medicine & Rehabilitation

## 2017-09-17 VITALS — BP 117/74 | HR 79

## 2017-09-17 DIAGNOSIS — G8110 Spastic hemiplegia affecting unspecified side: Secondary | ICD-10-CM

## 2017-09-17 NOTE — Progress Notes (Signed)
Dysport Injection for spasticity using needle EMG guidance  Dilution: 200 Units/ml Indication: Severe spasticity which interferes with ADL,mobility and/or  hygiene and is unresponsive to medication management and other conservative care Informed consent was obtained after describing risks and benefits of the procedure with the patient. This includes bleeding, bruising, infection, excessive weakness, or medication side effects. A REMS form is on file and signed. Needle:  needle electrode Number of units per muscle PQ100  Pectoralis 400 All injections were done after obtaining appropriate EMG activity and after negative drawback for blood. The patient tolerated the procedure well. Post procedure instructions were given. A followup appointment was made.   Based on EMG acitivity would consider injection of R PQ 200U R Pectoralis 100U Will reassess clinically in 6wks

## 2017-09-17 NOTE — Patient Instructions (Signed)

## 2017-09-18 ENCOUNTER — Other Ambulatory Visit: Payer: Self-pay | Admitting: Internal Medicine

## 2017-09-24 ENCOUNTER — Ambulatory Visit (INDEPENDENT_AMBULATORY_CARE_PROVIDER_SITE_OTHER): Payer: Medicare Other | Admitting: General Practice

## 2017-09-24 DIAGNOSIS — Z7901 Long term (current) use of anticoagulants: Secondary | ICD-10-CM

## 2017-09-24 DIAGNOSIS — I825Y3 Chronic embolism and thrombosis of unspecified deep veins of proximal lower extremity, bilateral: Secondary | ICD-10-CM

## 2017-09-24 LAB — POCT INR: INR: 2.4 (ref 2.0–3.0)

## 2017-09-24 NOTE — Patient Instructions (Addendum)
Pre visit review using our clinic review tool, if applicable. No additional management support is needed unless otherwise documented below in the visit note.  Continue to take 1 tablet every day except 1/2 tablet on Sundays and Thursdays.  Re-check in 4 weeks.

## 2017-09-25 ENCOUNTER — Other Ambulatory Visit: Payer: Self-pay | Admitting: Internal Medicine

## 2017-09-25 ENCOUNTER — Other Ambulatory Visit: Payer: Self-pay | Admitting: Endocrinology

## 2017-09-25 NOTE — Progress Notes (Signed)
Agree with management.  Lilyana Lippman J Derion Kreiter, MD  

## 2017-09-26 NOTE — Telephone Encounter (Signed)
Please refill x 1 Ov is due  

## 2017-09-29 ENCOUNTER — Other Ambulatory Visit: Payer: Self-pay | Admitting: Internal Medicine

## 2017-09-29 ENCOUNTER — Telehealth: Payer: Self-pay

## 2017-09-29 MED ORDER — AMOXICILLIN-POT CLAVULANATE 875-125 MG PO TABS: 1.0000 | ORAL_TABLET | Freq: Two times a day (BID) | ORAL | 0 refills | Status: DC

## 2017-09-29 NOTE — Telephone Encounter (Signed)
Prescription sent to pharmacy.

## 2017-09-29 NOTE — Telephone Encounter (Signed)
Pt's daughter aware.

## 2017-09-29 NOTE — Telephone Encounter (Signed)
Daughter called requesting augmentin be sent in for pt. She states that she has urine odor and hallucinating and she thinks she has a UTI. Please advise.

## 2017-10-04 ENCOUNTER — Telehealth: Payer: Self-pay | Admitting: Internal Medicine

## 2017-10-04 NOTE — Telephone Encounter (Signed)
One of Sandy Barrett daughters has called the [facility] inquiring if our office has seen FMLA forms and or a parking placard form.  I have informed the agent that I did not see a note in the chart but would check with [place] and Lovena Le.

## 2017-10-04 NOTE — Telephone Encounter (Signed)
I do not have anything for this patient.   LVM inform patient of that.

## 2017-10-04 NOTE — Telephone Encounter (Signed)
I have not seen FMLA forms or parking placard. Have you gotten these?

## 2017-10-05 ENCOUNTER — Emergency Department (HOSPITAL_COMMUNITY): Payer: Medicare Other

## 2017-10-05 ENCOUNTER — Other Ambulatory Visit: Payer: Self-pay

## 2017-10-05 ENCOUNTER — Emergency Department (HOSPITAL_COMMUNITY)
Admission: EM | Admit: 2017-10-05 | Discharge: 2017-10-05 | Disposition: A | Discharge status: Home / Self Care | Payer: Medicare Other | Attending: Emergency Medicine | Admitting: Emergency Medicine

## 2017-10-05 DIAGNOSIS — Z96641 Presence of right artificial hip joint: Secondary | ICD-10-CM | POA: Insufficient documentation

## 2017-10-05 DIAGNOSIS — R404 Transient alteration of awareness: Secondary | ICD-10-CM | POA: Insufficient documentation

## 2017-10-05 DIAGNOSIS — R279 Unspecified lack of coordination: Secondary | ICD-10-CM | POA: Diagnosis not present

## 2017-10-05 DIAGNOSIS — E162 Hypoglycemia, unspecified: Secondary | ICD-10-CM

## 2017-10-05 DIAGNOSIS — R4781 Slurred speech: Secondary | ICD-10-CM | POA: Diagnosis not present

## 2017-10-05 DIAGNOSIS — R0902 Hypoxemia: Secondary | ICD-10-CM | POA: Diagnosis not present

## 2017-10-05 DIAGNOSIS — R531 Weakness: Secondary | ICD-10-CM | POA: Diagnosis not present

## 2017-10-05 DIAGNOSIS — I499 Cardiac arrhythmia, unspecified: Secondary | ICD-10-CM | POA: Diagnosis not present

## 2017-10-05 DIAGNOSIS — R5383 Other fatigue: Secondary | ICD-10-CM | POA: Diagnosis present

## 2017-10-05 DIAGNOSIS — I10 Essential (primary) hypertension: Secondary | ICD-10-CM | POA: Diagnosis not present

## 2017-10-05 DIAGNOSIS — Z743 Need for continuous supervision: Secondary | ICD-10-CM | POA: Diagnosis not present

## 2017-10-05 DIAGNOSIS — I63512 Cerebral infarction due to unspecified occlusion or stenosis of left middle cerebral artery: Secondary | ICD-10-CM | POA: Diagnosis not present

## 2017-10-05 DIAGNOSIS — E039 Hypothyroidism, unspecified: Secondary | ICD-10-CM | POA: Insufficient documentation

## 2017-10-05 LAB — URINALYSIS, ROUTINE W REFLEX MICROSCOPIC
Bilirubin Urine: NEGATIVE
Glucose, UA: 50 mg/dL — AB
Hgb urine dipstick: NEGATIVE
Ketones, ur: NEGATIVE mg/dL
Leukocytes, UA: NEGATIVE
Nitrite: NEGATIVE
Protein, ur: NEGATIVE mg/dL
Specific Gravity, Urine: 1.023 (ref 1.005–1.030)
pH: 5 (ref 5.0–8.0)

## 2017-10-05 LAB — COMPREHENSIVE METABOLIC PANEL
ALT: 9 U/L (ref 0–44)
AST: 13 U/L — ABNORMAL LOW (ref 15–41)
Albumin: 2.8 g/dL — ABNORMAL LOW (ref 3.5–5.0)
Alkaline Phosphatase: 65 U/L (ref 38–126)
Anion gap: 6 (ref 5–15)
BUN: 8 mg/dL (ref 8–23)
CO2: 28 mmol/L (ref 22–32)
Calcium: 8.7 mg/dL — ABNORMAL LOW (ref 8.9–10.3)
Chloride: 106 mmol/L (ref 98–111)
Creatinine, Ser: 0.33 mg/dL — ABNORMAL LOW (ref 0.44–1.00)
GFR calc Af Amer: >60 mL/min (ref >60)
GFR calc non Af Amer: >60 mL/min (ref >60)
Glucose, Bld: 93 mg/dL (ref 70–99)
Potassium: 3.8 mmol/L (ref 3.5–5.1)
Sodium: 140 mmol/L (ref 135–145)
Total Bilirubin: 0.9 mg/dL (ref 0.3–1.2)
Total Protein: 5.9 g/dL — ABNORMAL LOW (ref 6.5–8.1)

## 2017-10-05 LAB — CBC
HCT: 39.5 % (ref 36.0–46.0)
Hemoglobin: 12.6 g/dL (ref 12.0–15.0)
MCH: 37.7 pg — ABNORMAL HIGH (ref 26.0–34.0)
MCHC: 31.9 g/dL (ref 30.0–36.0)
MCV: 118.3 fL — ABNORMAL HIGH (ref 78.0–100.0)
Platelets: 414 10*3/uL — ABNORMAL HIGH (ref 150–400)
RBC: 3.34 MIL/uL — ABNORMAL LOW (ref 3.87–5.11)
RDW: 15.2 % (ref 11.5–15.5)
WBC: 8.3 10*3/uL (ref 4.0–10.5)

## 2017-10-05 LAB — CBG MONITORING, ED
Glucose-Capillary: 125 mg/dL — ABNORMAL HIGH (ref 70–99)
Glucose-Capillary: 65 mg/dL — ABNORMAL LOW (ref 70–99)
Glucose-Capillary: 64 mg/dL — ABNORMAL LOW (ref 70–99)
Glucose-Capillary: 69 mg/dL — ABNORMAL LOW (ref 70–99)
Glucose-Capillary: 86 mg/dL (ref 70–99)
Glucose-Capillary: 126 mg/dL — ABNORMAL HIGH (ref 70–99)
Glucose-Capillary: 127 mg/dL — ABNORMAL HIGH (ref 70–99)

## 2017-10-05 LAB — I-STAT TROPONIN, ED: Troponin i, poc: 0 ng/mL (ref 0.00–0.08)

## 2017-10-05 MED ORDER — DEXTROSE 50 % IV SOLN
12.5000 g | Freq: Once | INTRAVENOUS | Status: AC
  Administered 2017-10-05: 12.5 g via INTRAVENOUS
  Filled 2017-10-05: qty 50

## 2017-10-05 NOTE — ED Triage Notes (Signed)
Pt BIBA for c/o slurred speech/mumbling that began around 1130 today; daughter also reports " my moms was leaning her face towards her right side and had a blank stare with her eyes wide open ; pt has a hx of aphasia and right sided deficit from previous stroke ; per daughter , back at baseline at this time

## 2017-10-05 NOTE — ED Notes (Signed)
CBG collected. Result "17." RN, Alexa, notified.

## 2017-10-05 NOTE — ED Notes (Signed)
Gave patient 2 orange juices and 2 graham crackers

## 2017-10-05 NOTE — ED Notes (Signed)
CBG collected. Result "125." RN, Alexa, notified.

## 2017-10-05 NOTE — ED Notes (Signed)
Sandwich and applesauce given to patient

## 2017-10-05 NOTE — ED Notes (Addendum)
Pt taken by PTAR back home. Stable.

## 2017-10-05 NOTE — ED Provider Notes (Signed)
  Physical Exam  BP (!) 120/98   Pulse 81   Temp 99 F (37.2 C) (Oral)   Resp 16   Ht 5' (1.524 m)   Wt 79.4 kg   SpO2 98%   BMI 34.18 kg/m   Physical Exam  ED Course/Procedures     Procedures  MDM  Patient has had somewhat recurrent hypoglycemia however levels only in the 60s.  This could be potential cause for some confusion.  However has been more stable after eating.  I think this point is okay for discharge home to follow-up with PCP.       Davonna Belling, MD 10/05/17 [phone]

## 2017-10-05 NOTE — ED Notes (Signed)
Dr. Sedonia Small aware of 67 CBG ; per md ok to give orange juice and crackers and then discharge patient

## 2017-10-05 NOTE — Discharge Instructions (Addendum)
You were evaluated in the Emergency Department and after careful evaluation, we did not find any emergent condition requiring admission or further testing in the hospital. ° °Please return to the Emergency Department if you experience any worsening of your condition.  We encourage you to follow up with a primary care provider.  Thank you for allowing us to be a part of your care. °

## 2017-10-05 NOTE — ED Notes (Signed)
Dr. Alvino Chapel aware of 64 CBG ; Md will come and evaluate patient ; 2 more orange juices given to patient

## 2017-10-05 NOTE — ED Provider Notes (Signed)
[facility] Emergency Department Provider Note MRN:  [id number]  Arrival date & time: 10/05/17     Chief Complaint   Fatigue and Aphasia   History of Present Illness   Sandy Barrett is a 84 y.o. year-old female with a history of stroke, hypertension presenting to the ED with chief complaint of altered mental status.  History obtained from daughter.  Patient was behaving her normal self earlier this morning.  When her daughter checked on her later in the morning, she was more somnolent than normal, and then she had an irregular episode where she stared into space for several moments.  Daughter explains that she is being treated for a UTI for the past 3 to 4 days, and during this illness she has had intermittent hallucinations.  Daughter explains she continues to respond to external stimuli here in the emergency department.  Denies recent fevers, no new numbness or weakness, no other obvious complaints.  I was unable to obtain an accurate HPI, PMH, or ROS due to the patient's altered mental status.  Review of Systems  A complete 10 system review of systems was obtained and all systems are negative except as noted in the HPI and PMH.   Patient's Health History    Past Medical History:  Diagnosis Date  . ARTHRITIS   . Arthritis   . Diverticulosis   . Essential thrombocytosis ([place])   . HYPERTENSION   . HYPERTHYROIDISM   . Hyperthyroidism    s/p I-131 ablation 03/2011 of multinod goiter  . INSOMNIA   . MIGRAINE HEADACHE   . OBESITY   . Posttraumatic stress disorder   . Pulmonary embolism ([place]) 03/2014   with DVT  . Stroke [facility]) 03/2014   dysarthria    Past Surgical History:  Procedure Laterality Date  . ABDOMINAL HYSTERECTOMY  1976  . BREAST SURGERY     biopsy  . RADIOLOGY WITH ANESTHESIA Left 03/08/2014   Procedure: RADIOLOGY WITH ANESTHESIA;  Surgeon: Rob Hickman, MD;  Location: [place];  Service: Radiology;  Laterality: Left;  . TONSILLECTOMY    .  TOTAL HIP ARTHROPLASTY  1998    right    Family History  Problem Relation Age of Onset  . Asthma Mother   . Asthma Father   . Prostate cancer Father   . Stroke Brother   . Breast cancer Sister   . Stomach cancer Sister   . Thyroid disease Neg Hx     Social History   Socioeconomic History  . Marital status: Widowed    Spouse name: Not on file  . Number of children: 2  . Years of education: 23  . Highest education level: Not on file  Occupational History  . Occupation: retired    Comment: Restaurant manager, fast food  Social Needs  . Financial resource strain: Not on file  . Food insecurity:    Worry: Not on file    Inability: Not on file  . Transportation needs:    Medical: Not on file    Non-medical: Not on file  Tobacco Use  . Smoking status: Never Smoker  . Smokeless tobacco: Never Used  Substance and Sexual Activity  . Alcohol use: No    Alcohol/week: 0.0 standard drinks  . Drug use: No  . Sexual activity: Never  Lifestyle  . Physical activity:    Days per week: Not on file    Minutes per session: Not on file  . Stress: Not on file  Relationships  . Social  connections:    Talks on phone: Not on file    Gets together: Not on file    Attends religious service: Not on file    Active member of club or organization: Not on file    Attends meetings of clubs or organizations: Not on file    Relationship status: Not on file  . Intimate partner violence:    Fear of current or ex partner: Not on file    Emotionally abused: Not on file    Physically abused: Not on file    Forced sexual activity: Not on file  Other Topics Concern  . Not on file  Social History Narrative   Widowed, lived alone prior to CVA 03/2014   SNF at [facility], then home 07/2014 with dtr   Right handed   Caffeine use- occasionally drinks tea     Physical Exam  Vital Signs and Nursing Notes reviewed Vitals:   10/05/17 1241 10/05/17 1411  BP: 131/76 120/66  Pulse: 74 72  Resp: 19 12   Temp: 99 F (37.2 C)   SpO2: 93% 95%    CONSTITUTIONAL: Chronically ill-appearing, NAD NEURO: Awake, alert, not oriented to person place or time, chronic right-sided weakness EYES:  eyes equal and reactive ENT/NECK:  no LAD, no JVD CARDIO: Regular rate, well-perfused, normal S1 and S2 PULM:  CTAB no wheezing or rhonchi GI/GU:  normal bowel sounds, non-distended, non-tender MSK/SPINE:  No gross deformities, no edema SKIN:  no rash, atraumatic PSYCH:  Appropriate speech and behavior  Diagnostic and Interventional Summary    EKG Interpretation  Date/Time:  Tuesday October 05 2017 12:57:34 EDT Ventricular Rate:  74 PR Interval:  188 QRS Duration: 64 QT Interval:  378 QTC Calculation: 419 R Axis:   65 Text Interpretation:  Normal sinus rhythm with sinus arrhythmia Low voltage QRS Cannot rule out Anterior infarct , age undetermined Abnormal ECG Confirmed by Gerlene Fee [phone]) on 10/05/2017 3:17:29 PM      Labs Reviewed  CBC - Abnormal; Notable for the following components:      Result Value   RBC 3.34 (*)    MCV 118.3 (*)    MCH 37.7 (*)    Platelets 414 (*)    All other components within normal limits  COMPREHENSIVE METABOLIC PANEL - Abnormal; Notable for the following components:   Creatinine, Ser 0.33 (*)    Calcium 8.7 (*)    Total Protein 5.9 (*)    Albumin 2.8 (*)    AST 13 (*)    All other components within normal limits  URINALYSIS, ROUTINE W REFLEX MICROSCOPIC - Abnormal; Notable for the following components:   Glucose, UA 50 (*)    All other components within normal limits  CBG MONITORING, ED - Abnormal; Notable for the following components:   Glucose-Capillary 65 (*)    All other components within normal limits  CBG MONITORING, ED - Abnormal; Notable for the following components:   Glucose-Capillary 125 (*)    All other components within normal limits  I-STAT TROPONIN, ED    CT HEAD WO CONTRAST  Final Result      Medications  dextrose 50 %  solution 12.5 g (12.5 g Intravenous Given 10/05/17 1251)     Procedures Critical Care  ED Course and Medical Decision Making  I have reviewed the triage vital signs and the nursing notes.  Pertinent labs & imaging results that were available during my care of the patient were reviewed by me and considered  in my medical decision making (see below for details).  Considering UTI versus metabolic disarray versus intracranial bleeding, work-up pending.  Work-up reveals normal CT head, normal labs, urinalysis with no evidence of infection.  Patient has had baseline mental status during her time in the emergency department.  Patient and patient's daughter feel comfortable with discharge and close PCP follow-up.  After the discussed management above, the patient was determined to be safe for discharge.  The patient was in agreement with this plan and all questions regarding their care were answered.  ED return precautions were discussed and the patient will return to the ED with any significant worsening of condition.  Barth Kirks. Sedonia Small, [place] [email or web address]  Final Clinical Impressions(s) / ED Diagnoses     ICD-10-CM   1. Altered awareness, transient R40.4     ED Discharge Orders    None         Maudie Flakes, MD 10/05/17 [phone]

## 2017-10-06 ENCOUNTER — Other Ambulatory Visit: Payer: Self-pay | Admitting: Internal Medicine

## 2017-10-11 DIAGNOSIS — H938X3 Other specified disorders of ear, bilateral: Secondary | ICD-10-CM | POA: Diagnosis not present

## 2017-10-12 ENCOUNTER — Ambulatory Visit: Payer: Medicare Other | Admitting: Podiatry

## 2017-10-13 ENCOUNTER — Ambulatory Visit: Payer: Medicare Other | Admitting: Podiatry

## 2017-10-19 ENCOUNTER — Ambulatory Visit (INDEPENDENT_AMBULATORY_CARE_PROVIDER_SITE_OTHER): Payer: Medicare Other | Admitting: Podiatry

## 2017-10-19 ENCOUNTER — Encounter: Payer: Self-pay | Admitting: Podiatry

## 2017-10-19 DIAGNOSIS — B351 Tinea unguium: Secondary | ICD-10-CM | POA: Diagnosis not present

## 2017-10-19 DIAGNOSIS — D689 Coagulation defect, unspecified: Secondary | ICD-10-CM

## 2017-10-19 DIAGNOSIS — M79674 Pain in right toe(s): Secondary | ICD-10-CM | POA: Diagnosis not present

## 2017-10-19 DIAGNOSIS — M79675 Pain in left toe(s): Secondary | ICD-10-CM

## 2017-10-20 ENCOUNTER — Telehealth: Payer: Self-pay | Admitting: Internal Medicine

## 2017-10-20 NOTE — Telephone Encounter (Signed)
Patient's daughter states referral was placed for Dr. Arther Dames, however they have been scheduled with a different provider and wanted to make sure it was ok?

## 2017-10-20 NOTE — Telephone Encounter (Signed)
Patient's daughter [name] called re: Referral Dr. Cruzita Lederer wants to send patient to [facility]. Please call [name] at ph# [phone], she has questions about the above.

## 2017-10-20 NOTE — Telephone Encounter (Signed)
That is OK

## 2017-10-22 ENCOUNTER — Inpatient Hospital Stay: Payer: Medicare Other | Attending: Hematology and Oncology

## 2017-10-22 DIAGNOSIS — D473 Essential (hemorrhagic) thrombocythemia: Secondary | ICD-10-CM | POA: Diagnosis not present

## 2017-10-22 DIAGNOSIS — Z86711 Personal history of pulmonary embolism: Secondary | ICD-10-CM

## 2017-10-22 LAB — PROTIME-INR
INR: 2.27
Prothrombin Time: 24.8 seconds — ABNORMAL HIGH (ref 11.4–15.2)

## 2017-10-25 ENCOUNTER — Telehealth: Payer: Self-pay | Admitting: Hematology and Oncology

## 2017-10-25 ENCOUNTER — Other Ambulatory Visit: Payer: Self-pay | Admitting: Hematology and Oncology

## 2017-10-25 ENCOUNTER — Telehealth: Payer: Self-pay

## 2017-10-25 ENCOUNTER — Ambulatory Visit (INDEPENDENT_AMBULATORY_CARE_PROVIDER_SITE_OTHER): Payer: Medicare Other | Admitting: General Practice

## 2017-10-25 DIAGNOSIS — D473 Essential (hemorrhagic) thrombocythemia: Secondary | ICD-10-CM

## 2017-10-25 DIAGNOSIS — Z7901 Long term (current) use of anticoagulants: Secondary | ICD-10-CM | POA: Diagnosis not present

## 2017-10-25 NOTE — Telephone Encounter (Signed)
-----   Message from Heath Lark, MD sent at 10/25/2017  8:10 AM EDT ----- Regarding: labs did not draw CBC Can you check if they draw extra tube from Friday? If not, please call daughter. I apologize it was not drawn. Her INR is good. Would they mind coming in this week just for CBC check?

## 2017-10-25 NOTE — Telephone Encounter (Signed)
Spoke to pt regarding appts per 10/21 sch message  °

## 2017-10-25 NOTE — Patient Instructions (Signed)
Pre visit review using our clinic review tool, if applicable. No additional management support is needed unless otherwise documented below in the visit note.  Lab resulted at [facility].  Patient's daughter has been given dosing instructions to continue to take 1 tablet every day except 1/2 tablet on Sundays and Thursdays.  Re-check in 4 weeks.  She verbalized understanding.  

## 2017-10-25 NOTE — Telephone Encounter (Signed)
Called and given below message. She verbalized understanding. Scheduling message sent. 

## 2017-10-26 ENCOUNTER — Telehealth: Payer: Self-pay | Admitting: Internal Medicine

## 2017-10-26 NOTE — Telephone Encounter (Addendum)
Patient's daughter has dropped off a [employer] form to be completed for patient. Forms have been completed & placed in providers box to review and sign.

## 2017-10-27 ENCOUNTER — Telehealth: Payer: Self-pay

## 2017-10-27 ENCOUNTER — Other Ambulatory Visit: Payer: Medicare Other

## 2017-10-27 ENCOUNTER — Telehealth: Payer: Self-pay | Admitting: Hematology and Oncology

## 2017-10-27 NOTE — Telephone Encounter (Signed)
Forms faxed to SCAT @[phone], Copy sent to scan.   Patient's daughter [name] informed & original mailed to patient.

## 2017-10-27 NOTE — Telephone Encounter (Signed)
signed

## 2017-10-27 NOTE — Telephone Encounter (Signed)
Patient called to cancel appointment for labs. Per 10/23 vm return calls. Tried calling patient to r/s but no answer.

## 2017-10-27 NOTE — Progress Notes (Signed)
Subjective:   Patient ID: Sandy Barrett, female   DOB: 84 y.o.   MRN: [id number]   HPI Patient presents with elongated nailbeds 1-5 both feet that can become painful and also is on anticoagulation therapy   ROS      Objective:  Physical Exam  Neurovascular status unchanged with thick yellow brittle nailbeds 1-5 both feet that are painful and makes it hard for him to wear shoe gear and also has concerns about blood thinner that she takes     Assessment:  High risk patient with chronic mycotic painful nail infection 1-5 both feet     Plan:  Debrided nailbeds 1-5 both feet with no iatrogenic bleeding noted

## 2017-10-27 NOTE — Telephone Encounter (Signed)
Tried to call regarding voicemail  °

## 2017-10-28 ENCOUNTER — Inpatient Hospital Stay: Payer: Medicare Other

## 2017-10-28 ENCOUNTER — Telehealth: Payer: Self-pay

## 2017-10-28 DIAGNOSIS — D473 Essential (hemorrhagic) thrombocythemia: Secondary | ICD-10-CM

## 2017-10-28 DIAGNOSIS — Z86711 Personal history of pulmonary embolism: Secondary | ICD-10-CM

## 2017-10-28 LAB — CBC WITH DIFFERENTIAL/PLATELET
Abs Immature Granulocytes: 0.05 10*3/uL (ref 0.00–0.07)
Basophils Absolute: 0.1 10*3/uL (ref 0.0–0.1)
Basophils Relative: 1 %
Eosinophils Absolute: 0.1 10*3/uL (ref 0.0–0.5)
Eosinophils Relative: 2 %
HCT: 40.7 % (ref 36.0–46.0)
Hemoglobin: 13.1 g/dL (ref 12.0–15.0)
Immature Granulocytes: 1 %
Lymphocytes Relative: 18 %
Lymphs Abs: 1 10*3/uL (ref 0.7–4.0)
MCH: 37.9 pg — ABNORMAL HIGH (ref 26.0–34.0)
MCHC: 32.2 g/dL (ref 30.0–36.0)
MCV: 117.6 fL — ABNORMAL HIGH (ref 80.0–100.0)
Monocytes Absolute: 0.3 10*3/uL (ref 0.1–1.0)
Monocytes Relative: 5 %
Neutro Abs: 4 10*3/uL (ref 1.7–7.7)
Neutrophils Relative %: 73 %
Platelets: 419 10*3/uL — ABNORMAL HIGH (ref 150–400)
RBC: 3.46 MIL/uL — ABNORMAL LOW (ref 3.87–5.11)
RDW: 15.1 % (ref 11.5–15.5)
WBC: 5.6 10*3/uL (ref 4.0–10.5)
nRBC: 0 % (ref 0.0–0.2)

## 2017-10-28 LAB — PROTIME-INR
INR: 2.65
Prothrombin Time: 27.9 seconds — ABNORMAL HIGH (ref 11.4–15.2)

## 2017-10-28 NOTE — Telephone Encounter (Signed)
Per 10/24 voice mail return calls. Patient requested to schedule a lab that was canceled in the earlier part of the week.

## 2017-10-28 NOTE — Telephone Encounter (Signed)
Called and left a message asking daughter to call the office.  Per Dr. Alvy Bimler, labs are stable, no changes. Will cancel 11/4 appts and reschedule to first week in December for lab and see Dr. Alvy Bimler.

## 2017-10-29 ENCOUNTER — Telehealth: Payer: Self-pay | Admitting: Hematology and Oncology

## 2017-10-29 ENCOUNTER — Encounter: Payer: Self-pay | Admitting: Physical Medicine & Rehabilitation

## 2017-10-29 ENCOUNTER — Encounter: Payer: Medicare Other | Attending: Physical Medicine & Rehabilitation

## 2017-10-29 ENCOUNTER — Ambulatory Visit (HOSPITAL_BASED_OUTPATIENT_CLINIC_OR_DEPARTMENT_OTHER): Payer: Medicare Other | Admitting: Physical Medicine & Rehabilitation

## 2017-10-29 VITALS — BP 119/80 | HR 76

## 2017-10-29 DIAGNOSIS — G8110 Spastic hemiplegia affecting unspecified side: Secondary | ICD-10-CM | POA: Insufficient documentation

## 2017-10-29 NOTE — Telephone Encounter (Signed)
Appts scheduled patient's daughter notified per 10/25 sch msg

## 2017-10-29 NOTE — Telephone Encounter (Signed)
Called daughter and given below message. She verbalized understanding. Scheduling message sent for appt for labs and Dr. Alvy Bimler the first week of December.

## 2017-10-29 NOTE — Patient Instructions (Addendum)
Please call if you'd like to schedule SPR Sprint system implant

## 2017-10-29 NOTE — Progress Notes (Signed)
Subjective:    Patient ID: Sandy Barrett, female    DOB: 1932/10/01, 84 y.o.   MRN: [id number]  HPI Lyrica not much better than gabapentin according to the patient's daughter.  The patient is a phasic from a large left MCA distribution infarct and has chronic right upper extremity pain.  She was initially treated with gabapentin however this was not helpful according to daughter.  The patient cannot give a pain rating and daughter tries to assist.  The patient was switched from gabapentin to Lyrica empirically to see if her upper extremity pain would improve Patient has been receiving Dysport injection to the pectoralis as well as pronator quadratus to help with some of her pain.  This is also in an effort to improve range of motion at the shoulder which is limited. Per daughter patient gets mild relief with right shoulder injection last performed 08/17/2017  Pain Inventory Average Pain 6 Pain Right Now 6 My pain is intermittent, constant, sharp, stabbing and aching  In the last 24 hours, has pain interfered with the following? General activity 7 Relation with others 6 Enjoyment of life 6 What TIME of day is your pain at its worst? evening Sleep (in general) Good  Pain is worse with: bending and sitting Pain improves with: rest, therapy/exercise, medication and injections Relief from Meds: 5  Mobility use a wheelchair Do you have any goals in this area?  no  Function retired I need assistance with the following:  dressing, bathing, toileting, meal prep, household duties and shopping Do you have any goals in this area?  no  Neuro/Psych bladder control problems bowel control problems trouble walking spasms  Prior Studies Any changes since last visit?  no  Physicians involved in your care Any changes since last visit?  no   Family History  Problem Relation Age of Onset  . Asthma Mother   . Asthma Father   . Prostate cancer Father   . Stroke Brother   . Breast  cancer Sister   . Stomach cancer Sister   . Thyroid disease Neg Hx    Social History   Socioeconomic History  . Marital status: Widowed    Spouse name: Not on file  . Number of children: 2  . Years of education: 74  . Highest education level: Not on file  Occupational History  . Occupation: retired    Comment: Restaurant manager, fast food  Social Needs  . Financial resource strain: Not on file  . Food insecurity:    Worry: Not on file    Inability: Not on file  . Transportation needs:    Medical: Not on file    Non-medical: Not on file  Tobacco Use  . Smoking status: Never Smoker  . Smokeless tobacco: Never Used  Substance and Sexual Activity  . Alcohol use: No    Alcohol/week: 0.0 standard drinks  . Drug use: No  . Sexual activity: Never  Lifestyle  . Physical activity:    Days per week: Not on file    Minutes per session: Not on file  . Stress: Not on file  Relationships  . Social connections:    Talks on phone: Not on file    Gets together: Not on file    Attends religious service: Not on file    Active member of club or organization: Not on file    Attends meetings of clubs or organizations: Not on file    Relationship status: Not on file  Other Topics Concern  .  Not on file  Social History Narrative   Widowed, lived alone prior to CVA 03/2014   SNF at [facility], then home 07/2014 with dtr   Right handed   Caffeine use- occasionally drinks tea   Past Surgical History:  Procedure Laterality Date  . ABDOMINAL HYSTERECTOMY  1976  . BREAST SURGERY     biopsy  . RADIOLOGY WITH ANESTHESIA Left 03/08/2014   Procedure: RADIOLOGY WITH ANESTHESIA;  Surgeon: Rob Hickman, MD;  Location: [place];  Service: Radiology;  Laterality: Left;  . TONSILLECTOMY    . TOTAL HIP ARTHROPLASTY  1998    right   Past Medical History:  Diagnosis Date  . ARTHRITIS   . Arthritis   . Diverticulosis   . Essential thrombocytosis ([place])   . HYPERTENSION   . HYPERTHYROIDISM   .  Hyperthyroidism    s/p I-131 ablation 03/2011 of multinod goiter  . INSOMNIA   . MIGRAINE HEADACHE   . OBESITY   . Posttraumatic stress disorder   . Pulmonary embolism ([place]) 03/2014   with DVT  . Stroke ([place]) 03/2014   dysarthria   BP 119/80   Pulse 76   SpO2 95%   Opioid Risk Score:   Fall Risk Score:  `1  Depression screen PHQ 2/9  Depression screen [facility] 2/9 08/17/2017 07/20/2017 08/21/2016  Decreased Interest 0 0 0  Down, Depressed, Hopeless 0 0 0  PHQ - 2 Score 0 0 0  Some recent data might be hidden    Review of Systems  Constitutional: Negative.   HENT: Negative.   Eyes: Negative.   Cardiovascular: Positive for leg swelling.  Gastrointestinal: Negative.   Endocrine: Negative.   Genitourinary: Positive for difficulty urinating.  Musculoskeletal: Positive for gait problem.       Spasms   Skin: Negative.   Neurological: Positive for weakness.  All other systems reviewed and are negative.      Objective:   Physical Exam  Constitutional: She appears well-developed and well-nourished. No distress.  HENT:  Head: Normocephalic and atraumatic.  Eyes: Pupils are equal, round, and reactive to light.  Musculoskeletal:  1 fingerbreadth subluxation at the right shoulder No apparent grimacing with finger or wrist range of motion or with elbow range of motion.  There is pain with abduction greater than 90 degrees but no pain with external rotation  Neurological: She is alert.  Patient is globally aphasic, she responds to all questions with phrase "Designer, multimedia" She is unable to follow commands even with gestural cues Evidence of ideomotor apraxia as well she is nonambulatory wheelchair-bound Right upper extremity strength is 0/5 in all muscle groups  Skin: She is not diaphoretic.  Nursing note and vitals reviewed.  Her tone is MAS 1 at the biceps MAS 1 at the wrist flexor and finger flexors MAS 2 at the pectoralis        Assessment & Plan:  #1.  Hemiplegic shoulder  pain this is a multifactorial problem which involves shoulder instability to do it due to weakness as well as neuropathic component.  We discussed treatment options she is already tried physical therapy over-the-counter analgesics prescription medications as well as corticosteroid injections. For now the patient's daughter would like to try another corticosteroid injection which can be done approximate 1 month from now. Other options include the SPR therapeutics Sprint system which will allow percutaneous neuromuscular stimulation daily for 6 to 8 weeks improve movement related pain.  Literature was given patient's daughter will  think about it.

## 2017-11-01 ENCOUNTER — Ambulatory Visit (INDEPENDENT_AMBULATORY_CARE_PROVIDER_SITE_OTHER): Payer: Medicare Other | Admitting: General Practice

## 2017-11-01 DIAGNOSIS — Z7901 Long term (current) use of anticoagulants: Secondary | ICD-10-CM

## 2017-11-01 NOTE — Patient Instructions (Signed)
Pre visit review using our clinic review tool, if applicable. No additional management support is needed unless otherwise documented below in the visit note.  Lab resulted at [address].  Patient's daughter has been given dosing instructions to continue to take 1 tablet every day except 1/2 tablet on Sundays and Thursdays.  Re-check in 4 weeks.  She verbalized understanding.  

## 2017-11-08 ENCOUNTER — Ambulatory Visit: Payer: Medicare Other | Admitting: Hematology and Oncology

## 2017-11-08 ENCOUNTER — Other Ambulatory Visit: Payer: Medicare Other

## 2017-11-15 ENCOUNTER — Telehealth: Payer: Self-pay | Admitting: *Deleted

## 2017-11-15 NOTE — Telephone Encounter (Signed)
Caller left a vm stating patient has rash on face (cheeks?). At first started as splotchy and red x 1 week. She now has raised bumps, "not extreme" per caller. No other sxs mentioned.  She is requesting PCP's advisement on tx or if OV is needed.

## 2017-11-16 NOTE — Telephone Encounter (Signed)
Will you please make OV for pt

## 2017-11-16 NOTE — Telephone Encounter (Signed)
Would you like OV for pt?

## 2017-11-16 NOTE — Telephone Encounter (Signed)
Pt's daughter called and scheduled OV for pt.  They want to see Dr. Quay Burow only - so OV has been scheduled for next week.  Daughter wants to know what she can use on the rash since it is on her face.

## 2017-11-16 NOTE — Telephone Encounter (Signed)
Needs an OV

## 2017-11-16 NOTE — Telephone Encounter (Signed)
Left message with [name] to call back to schedule.

## 2017-11-17 NOTE — Telephone Encounter (Signed)
Daughter is aware of response.  

## 2017-11-17 NOTE — Telephone Encounter (Signed)
Spoke with daughter and she advised that they no longer have the triamcinolone cream.

## 2017-11-17 NOTE — Telephone Encounter (Signed)
If the rash is an allergic reaction a steroid cream may help -  she can try the triamcinolone cream she should have at home until she comes in next week.  She can apply it twice a day.

## 2017-11-17 NOTE — Telephone Encounter (Signed)
If asymptomatic don't use anything until she comes in

## 2017-11-18 DIAGNOSIS — E0520 Thyrotoxicosis with toxic multinodular goiter without thyrotoxic crisis or storm: Secondary | ICD-10-CM | POA: Diagnosis not present

## 2017-11-18 DIAGNOSIS — E048 Other specified nontoxic goiter: Secondary | ICD-10-CM | POA: Diagnosis not present

## 2017-11-18 DIAGNOSIS — E0590 Thyrotoxicosis, unspecified without thyrotoxic crisis or storm: Secondary | ICD-10-CM | POA: Diagnosis not present

## 2017-11-19 ENCOUNTER — Telehealth: Payer: Self-pay

## 2017-11-19 ENCOUNTER — Ambulatory Visit: Payer: Medicare Other

## 2017-11-19 NOTE — Telephone Encounter (Signed)
Patients sister called the clinic today, states has been trying to get a refill of lyrica medication for her.  According to last note:  She was initially treated with gabapentin however this was not helpful according to daughter.  The patient cannot give a pain rating and daughter tries to assist.  The patient was switched from gabapentin to Lyrica empirically to see if her upper extremity pain would improve. Lyrica not much better than gabapentin according to the patient's daughter.  The patient is a phasic from a large left MCA distribution infarct and has chronic right upper extremity pain.  She was initially treated with gabapentin however this was not helpful according to daughter.   Unsure wither or not to fill this medication based on current information.  Please advise.

## 2017-11-22 NOTE — Telephone Encounter (Signed)
Neither medication gabapentin or Lyrica clearly effective.  Per PCP on Tramadol Other option would be the SPR sprint system, which we discussed at last visit given failure of multiple meds Can use Tylenol 325mg  2tab  every 6 hours as needed in addition to tramadol

## 2017-11-22 NOTE — Telephone Encounter (Signed)
Left VM with information for Sandy Barrett (ec).

## 2017-11-23 ENCOUNTER — Ambulatory Visit (HOSPITAL_BASED_OUTPATIENT_CLINIC_OR_DEPARTMENT_OTHER): Payer: Medicare Other | Admitting: Physical Medicine & Rehabilitation

## 2017-11-23 ENCOUNTER — Encounter: Payer: Medicare Other | Attending: Physical Medicine & Rehabilitation

## 2017-11-23 ENCOUNTER — Encounter: Payer: Self-pay | Admitting: Physical Medicine & Rehabilitation

## 2017-11-23 VITALS — BP 117/80 | HR 85 | Ht 60.0 in | Wt 178.0 lb

## 2017-11-23 DIAGNOSIS — M7501 Adhesive capsulitis of right shoulder: Secondary | ICD-10-CM

## 2017-11-23 DIAGNOSIS — G8110 Spastic hemiplegia affecting unspecified side: Secondary | ICD-10-CM | POA: Insufficient documentation

## 2017-11-23 NOTE — Patient Instructions (Signed)
Max tylenol dose 2600mg  in 24 hr

## 2017-11-23 NOTE — Progress Notes (Signed)
Shoulder injection Right glenohumeral With ultrasound guidance)  Indication:Right Shoulder pain not relieved by medication management and other conservative care.  Informed consent was obtained after describing risks and benefits of the procedure with the patient, this includes bleeding, bruising, infection and medication side effects. The patient wishes to proceed and has given written consent. Patient was placed in a seated position. The Right shoulder was marked and prepped with betadine in the posterior subacromial area. A 25-gauge 1-1/2 inch needle was inserted into the subacromial area.70ml of 1% lidocaine infiltrated into the skin and sub cut tissue.  Then a 23mm ECHOBloc needle was inserted under direct [place] visualization long axis into the glenohumeral joint   After negative draw back for blood, a solution containing 1 mL of 6 mg per ML betamethasone and 4 mL of 1% lidocaine was injected. A band aid was applied. The patient tolerated the procedure well. Post procedure instructions were given.  Will see pt back in ~51mo for Dysport injection  We discussed other treatment options for Right shoulder pain including SPR therapeutics PENS, Suprascapular nerve block

## 2017-11-24 ENCOUNTER — Encounter: Payer: Self-pay | Admitting: Internal Medicine

## 2017-11-24 ENCOUNTER — Ambulatory Visit (INDEPENDENT_AMBULATORY_CARE_PROVIDER_SITE_OTHER): Payer: Medicare Other | Admitting: Internal Medicine

## 2017-11-24 DIAGNOSIS — G8929 Other chronic pain: Secondary | ICD-10-CM

## 2017-11-24 DIAGNOSIS — R21 Rash and other nonspecific skin eruption: Secondary | ICD-10-CM | POA: Diagnosis not present

## 2017-11-24 DIAGNOSIS — R443 Hallucinations, unspecified: Secondary | ICD-10-CM | POA: Insufficient documentation

## 2017-11-24 DIAGNOSIS — M25511 Pain in right shoulder: Secondary | ICD-10-CM

## 2017-11-24 MED ORDER — TRAMADOL HCL 50 MG PO TABS: ORAL_TABLET | ORAL | 1 refills | Status: DC

## 2017-11-24 MED ORDER — PREGABALIN 25 MG PO CAPS: 25.0000 mg | ORAL_CAPSULE | Freq: Three times a day (TID) | ORAL | 2 refills | Status: DC

## 2017-11-24 NOTE — Assessment & Plan Note (Signed)
Chronic in nature Following with Dr Letta Pate Deciding about shoulder nerve stimulator Will restart Lyrica in meantime

## 2017-11-24 NOTE — Progress Notes (Signed)
Subjective:    Patient ID: Sandy Barrett, female    DOB: 04-23-1932, 84 y.o.   MRN: [id number]  HPI The patient is here for an acute visit.   Rash:  She had a rash on her left lower face and it is better.  It was a little red with little white heads.  It did not appear to itch her or bother her.  Her daughter is not concerned about it.  She did not put anything on it.   Right shoulder pain:  She is following with Dr Letta Pate.  She was taking Lyrica and it helped, but was not taking her pain away completely.  Dr Letta Pate stopped the Lyrica and would like her to try a new device - electric stimulator for the shoulder.  Her daughter is not sure if this is something they want to try or not.  She does want her to have something for the pain until they can decide what to do.    For the past week she has had difficulty sleeping;  She is not uncomfortable.  She does hear things sometimes.   Her daughter denies new medications, except for an increase in the tramadol.  She takes tramadol three times a day.  Prior to the past couple of weeks she was only taking it twice a day.     Medications and allergies reviewed with patient and updated if appropriate.  Patient Active Problem List   Diagnosis Date Noted  . Adhesive capsulitis of right shoulder 11/23/2017  . Bilateral leg edema 05/24/2017  . Cholecystitis 02/23/2017  . Chronic respiratory failure with hypoxia ([place]) 02/23/2017  . Neuralgia 01/09/2017  . Abnormal urine odor 10/28/2016  . Venous congestion 10/08/2016  . Lymphedema of breast 09/11/2016  . Long term (current) use of anticoagulants [Z79.01] 09/10/2016  . Left hip pain 08/09/2016  . Yeast infection of the skin 07/01/2016  . Left leg swelling 05/18/2016  . Aspiration pneumonia ([place]) 04/20/2016  . Hemidiaphragm paralysis   . Wheelchair bound 02/03/2016  . Discoloration of skin 09/26/2015  . Essential thrombocytosis ([place]) 07/31/2015  . SVC (superior vena cava obstruction),  chronic 07/20/2015  . Dysphagia 07/20/2015  . Constipation 07/20/2015  . Thrombophlebitis of breast, right 06/18/2015  . Tracheal deviation 06/06/2015  . Sensorineural hearing loss, bilateral, moderate-moderately severe 04/30/2015  . Abdominal wall lump 03/09/2015  . Frequent UTI 02/06/2015  . Primary osteoarthritis involving multiple joints 07/26/2014  . Pernicious anemia 07/26/2014  . Encounter for therapeutic drug monitoring 07/17/2014  . Spastic hemiparesis affecting dominant side ([place]), right 05/31/2014  . DVT of lower extremity, bilateral ([place]) 03/14/2014  . Global aphasia 03/14/2014  . 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 Aphasia S/P CVA 03/13/2014  . Cerebral infarction due to embolism of left middle cerebral artery ([place])   . Stroke, embolic ([place]) 54/65/6812  . History of pulmonary embolism 03/08/2014  . Primary localized osteoarthrosis, lower leg 03/06/2013  . IBS (irritable bowel syndrome)   . Multinodular goiter 01/13/2011  . Hyperthyroidism 11/11/2009  . OBESITY 04/21/2008  . Osteoarthritis 04/21/2008  . Migraine headache 04/20/2008  . Essential hypertension 04/20/2008    Current Outpatient Medications on File Prior to Visit  Medication Sig Dispense Refill  . acetaminophen (TYLENOL) 160 MG/5ML liquid Take 480 mg by mouth every 4 (four) hours as needed for fever or pain.    . calamine lotion Apply 1 application as needed topically for itching. 120 mL 0  . ciclopirox (PENLAC) 8 % solution Apply  topically at bedtime. Apply over nail & surrounding skin. Apply Qd over previous coat. After 7 days, remove w alcohol, continue cycle. 6.6 mL 5  . diclofenac sodium (VOLTAREN) 1 % GEL APPLY (2GMS) TOPICALLY THREE TIMES DAILY. (Patient taking differently: Apply 2 g topically 3 (three) times daily as needed. ) 300 g 5  . hydroxyurea (HYDREA) 500 MG capsule Take 1 tablet (500 mg) daily except 2 tablets ( 1000 mg ) on Wednesday, Saturday and Sunday 100 capsule 3  .  ipratropium-albuterol (DUONEB) 0.5-2.5 (3) MG/3ML SOLN Take 3 mLs by nebulization every 6 (six) hours as needed. (Patient taking differently: Take 3 mLs by nebulization every 6 (six) hours as needed (shortness of breath). ) 360 mL 0  . lidocaine (LIDODERM) 5 % Place 1 patch daily onto the skin. Remove & Discard patch within 12 hours or as directed by MD (Patient taking differently: Place 1 patch onto the skin daily as needed (pain). Remove & Discard patch within 12 hours or as directed by MD) 15 patch 0  . methimazole (TAPAZOLE) 5 MG tablet TAKE 1 TABLET BY MOUTH TWICE WEEKLY (Patient taking differently: Take 5 mg by mouth 2 (two) times a week. TAKE 1 TABLET BY MOUTH TWICE WEEKLY) 12 tablet 1  . NONFORMULARY OR COMPOUNDED ITEM Topical cream with equal parts diclofenac, gabapentin, lidocaine, menthol  Disp 100 gm 1 each 5  . Oral Hygiene Products (Q-CARE COVERD YANKAUER/SUCTION) MISC Use as directed 10 each 5  . OXYGEN 2lpm 24/7  DME- [facility]    . polyethylene glycol (MIRALAX / GLYCOLAX) packet TAKE 17G BY MOUTH TWICE A DAY (Patient taking differently: Take 17 g by mouth 2 (two) times daily as needed for mild constipation. ) 14 packet 1  . Probiotic Product (ALIGN) 4 MG CAPS Take 4 mg by mouth daily.     . propranolol (INDERAL) 10 MG tablet Take 0.5 tablets (5 mg total) by mouth 2 (two) times daily. 60 tablet 5  . PROTONIX 40 MG PACK TAKE 20 MLS (40 MG TOTAL) BY MOUTH DAILY. 40 mL 5  . terconazole (TERAZOL 7) 0.4 % vaginal cream Place 1 applicator vaginally at bedtime. (Patient taking differently: Place 1 applicator vaginally at bedtime as needed (irritation). ) 90 g 3  . traZODone (DESYREL) 50 MG tablet TAKE 1 TABLET (50 MG TOTAL) BY MOUTH AT BEDTIME AS NEEDED. FOR SLEEP 30 tablet 5  . warfarin (COUMADIN) 4 MG tablet Take 1 tablet daily except 1/2 on Sunday and thursdays or Take as directed by anticoagulation clinic. 40 tablet 3   No current facility-administered medications on file prior to visit.      Past Medical History:  Diagnosis Date  . ARTHRITIS   . Arthritis   . Diverticulosis   . Essential thrombocytosis ([place])   . HYPERTENSION   . HYPERTHYROIDISM   . Hyperthyroidism    s/p I-131 ablation 03/2011 of multinod goiter  . INSOMNIA   . MIGRAINE HEADACHE   . OBESITY   . Posttraumatic stress disorder   . Pulmonary embolism ([place]) 03/2014   with DVT  . Stroke [facility]) 03/2014   dysarthria    Past Surgical History:  Procedure Laterality Date  . ABDOMINAL HYSTERECTOMY  1976  . BREAST SURGERY     biopsy  . RADIOLOGY WITH ANESTHESIA Left 03/08/2014   Procedure: RADIOLOGY WITH ANESTHESIA;  Surgeon: Rob Hickman, MD;  Location: [facility];  Service: Radiology;  Laterality: Left;  . TONSILLECTOMY    . TOTAL HIP ARTHROPLASTY  1998    right    Social History   Socioeconomic History  . Marital status: Widowed    Spouse name: Not on file  . Number of children: 2  . Years of education: 63  . Highest education level: Not on file  Occupational History  . Occupation: retired    Comment: Restaurant manager, fast food  Social Needs  . Financial resource strain: Not on file  . Food insecurity:    Worry: Not on file    Inability: Not on file  . Transportation needs:    Medical: Not on file    Non-medical: Not on file  Tobacco Use  . Smoking status: Never Smoker  . Smokeless tobacco: Never Used  Substance and Sexual Activity  . Alcohol use: No    Alcohol/week: 0.0 standard drinks  . Drug use: No  . Sexual activity: Never  Lifestyle  . Physical activity:    Days per week: Not on file    Minutes per session: Not on file  . Stress: Not on file  Relationships  . Social connections:    Talks on phone: Not on file    Gets together: Not on file    Attends religious service: Not on file    Active member of club or organization: Not on file    Attends meetings of clubs or organizations: Not on file    Relationship status: Not on file  Other Topics Concern  . Not on file  Social  History Narrative   Widowed, lived alone prior to CVA 03/2014   SNF at [facility], then home 07/2014 with dtr   Right handed   Caffeine use- occasionally drinks tea    Family History  Problem Relation Age of Onset  . Asthma Mother   . Asthma Father   . Prostate cancer Father   . Stroke Brother   . Breast cancer Sister   . Stomach cancer Sister   . Thyroid disease Neg Hx     Review of Systems  Unable to perform ROS: Patient nonverbal       Objective:   Vitals:   11/24/17 1530  BP: 114/64  Resp: 16   BP Readings from Last 3 Encounters:  11/24/17 114/64  11/23/17 117/80  10/29/17 119/80   Wt Readings from Last 3 Encounters:  11/23/17 178 lb (80.7 kg)  10/05/17 175 lb (79.4 kg)  04/20/17 176 lb (79.8 kg)   Body mass index is 34.76 kg/m.   Physical Exam  Constitutional: She appears well-developed and well-nourished. No distress.  HENT:  Head: Normocephalic and atraumatic.  Cardiovascular: Normal rate and regular rhythm.  Pulmonary/Chest: Effort normal. No respiratory distress. She has no wheezes. She has no rales.  Abdominal: Soft. She exhibits no distension. There is no tenderness.  Musculoskeletal: She exhibits edema (1+ pitting b/l LE edema).  Skin: Skin is warm and dry. Rash (resolving rash left lower face) noted. She is not diaphoretic.           Assessment & Plan:    See Problem List for Assessment and Plan of chronic medical problems.

## 2017-11-24 NOTE — Assessment & Plan Note (Signed)
Having some hallucinations at night only No new medications - she is taking tramadol three times a day now, but always took it at night Will monitor for now Not occurring nightly, No symptoms during the day so unlikely as infection

## 2017-11-24 NOTE — Assessment & Plan Note (Signed)
Mild, left lower face Resolving No treatment needed

## 2017-11-24 NOTE — Patient Instructions (Addendum)
   Flu immunization administered today.    Medications reviewed and updated.  Changes include :   Restarting lyrica  Your prescription(s) have been submitted to your pharmacy. Please take as directed and contact our office if you believe you are having problem(s) with the medication(s).   Please followup in 6 months

## 2017-12-06 ENCOUNTER — Inpatient Hospital Stay (HOSPITAL_BASED_OUTPATIENT_CLINIC_OR_DEPARTMENT_OTHER): Payer: Medicare Other | Admitting: Hematology and Oncology

## 2017-12-06 ENCOUNTER — Telehealth: Payer: Self-pay | Admitting: Hematology and Oncology

## 2017-12-06 ENCOUNTER — Encounter: Payer: Self-pay | Admitting: Hematology and Oncology

## 2017-12-06 ENCOUNTER — Inpatient Hospital Stay: Payer: Medicare Other | Attending: Hematology and Oncology

## 2017-12-06 DIAGNOSIS — G8111 Spastic hemiplegia affecting right dominant side: Secondary | ICD-10-CM | POA: Diagnosis not present

## 2017-12-06 DIAGNOSIS — D473 Essential (hemorrhagic) thrombocythemia: Secondary | ICD-10-CM | POA: Insufficient documentation

## 2017-12-06 DIAGNOSIS — Z7901 Long term (current) use of anticoagulants: Secondary | ICD-10-CM | POA: Insufficient documentation

## 2017-12-06 DIAGNOSIS — Z86711 Personal history of pulmonary embolism: Secondary | ICD-10-CM | POA: Diagnosis not present

## 2017-12-06 DIAGNOSIS — G8110 Spastic hemiplegia affecting unspecified side: Secondary | ICD-10-CM

## 2017-12-06 LAB — CBC WITH DIFFERENTIAL/PLATELET
Abs Immature Granulocytes: 0.03 10*3/uL (ref 0.00–0.07)
Basophils Absolute: 0.1 10*3/uL (ref 0.0–0.1)
Basophils Relative: 1 %
Eosinophils Absolute: 0.1 10*3/uL (ref 0.0–0.5)
Eosinophils Relative: 2 %
HCT: 41.7 % (ref 36.0–46.0)
Hemoglobin: 13.4 g/dL (ref 12.0–15.0)
Immature Granulocytes: 1 %
Lymphocytes Relative: 17 %
Lymphs Abs: 1 10*3/uL (ref 0.7–4.0)
MCH: 38.4 pg — ABNORMAL HIGH (ref 26.0–34.0)
MCHC: 32.1 g/dL (ref 30.0–36.0)
MCV: 119.5 fL — ABNORMAL HIGH (ref 80.0–100.0)
Monocytes Absolute: 0.3 10*3/uL (ref 0.1–1.0)
Monocytes Relative: 5 %
Neutro Abs: 4.3 10*3/uL (ref 1.7–7.7)
Neutrophils Relative %: 74 %
Platelets: 411 10*3/uL — ABNORMAL HIGH (ref 150–400)
RBC: 3.49 MIL/uL — ABNORMAL LOW (ref 3.87–5.11)
RDW: 15.5 % (ref 11.5–15.5)
WBC: 5.7 10*3/uL (ref 4.0–10.5)
nRBC: 0 % (ref 0.0–0.2)

## 2017-12-06 LAB — PROTIME-INR
INR: 2.2
Prothrombin Time: 24.1 seconds — ABNORMAL HIGH (ref 11.4–15.2)

## 2017-12-06 MED ORDER — HYDROXYUREA 500 MG PO CAPS: ORAL_CAPSULE | ORAL | 3 refills | Status: DC

## 2017-12-06 NOTE — Assessment & Plan Note (Signed)
She had stable CBC in the last 3 months For now, she will take 1000 mg 3 times a week and 500 mg the rest of the week With improvement of her platelet count control, I plan to recheck her lab work again in 3 months

## 2017-12-06 NOTE — Assessment & Plan Note (Signed)
She has poor mobility with right-sided weakness and chronic swelling and expressive dysphasia Her daughter takes good care of her She inquired about her chronic lymphedema which I think is related to poor circulation from immobility, prior stroke and recurrent clots.

## 2017-12-06 NOTE — Assessment & Plan Note (Signed)
Due to her high risk disease, she will continue anticoagulation therapy indefinitely She will continue INR monitoring through her primary care doctor. Today, her INR is stable Her daughter is informed of the excellent test results.

## 2017-12-06 NOTE — Telephone Encounter (Signed)
Gave avs and calendar ° °

## 2017-12-06 NOTE — Progress Notes (Signed)
[place] OFFICE PROGRESS NOTE  Patient Care Team: [name], MD as PCP - General (Internal Medicine) Eunice Blase, MD as Consulting Physician (Family Medicine) Renato Shin, MD as Consulting Physician (Endocrinology) Netta Cedars, MD as Consulting Physician (Orthopedic Surgery) Lyndal Pulley, DO (Sports Medicine) Garvin Fila, MD (Neurology)  ASSESSMENT & PLAN:  Essential thrombocytosis [facility]) She had stable CBC in the last 3 months For now, she will take 1000 mg 3 times a week and 500 mg the rest of the week With improvement of her platelet count control, I plan to recheck her lab work again in 3 months  History of pulmonary embolism Due to her high risk disease, she will continue anticoagulation therapy indefinitely She will continue INR monitoring through her primary care doctor. Today, her INR is stable Her daughter is informed of the excellent test results.  Spastic hemiparesis affecting dominant side ([place]), right She has poor mobility with right-sided weakness and chronic swelling and expressive dysphasia Her daughter takes good care of her She inquired about her chronic lymphedema which I think is related to poor circulation from immobility, prior stroke and recurrent clots.   No orders of the defined types were placed in this encounter.   INTERVAL HISTORY: Please see below for problem oriented charting. She returns with her daughter for further follow-up Due to her dysphagia, the daughter provided majority of the history She is compliant taking hydroxyurea as directed No recent infection, fever or chills The patient denies any recent signs or symptoms of bleeding such as spontaneous epistaxis, hematuria or hematochezia. No recent diagnosis of recurrence of blood clots or stroke  SUMMARY OF ONCOLOGIC HISTORY:   Essential thrombocytosis ([place])   08/07/2013 Miscellaneous    The patient is noted to have elevated platelet count    03/08/2014  Imaging    Positive for acute PE with CT evidence of right heart strain (RV/LV Ratio = 0.9) consistent with at least submassive (intermediate risk)PE.     03/09/2014 Imaging    US venous Doppler showed deep vein thrombosis noted in the right distal common femoral vein, femoral vein, and popliteal vein. DVT noted in the left femoral and popliteal veins    03/09/2014 Imaging    Patchy areas of acute left MCA territory infarction. 2. Several punctate foci of acute right MCA territory infarction. Possible trace subarachnoid hemorrhage in the high right frontal lobe. 3. Occluded left ICA and left MCA    03/20/2015 Imaging    Compared to MRI on 03/09/14, there has been expected evolutional change of left MCA infarction. In addition, there may be a few areas of acute-subacute infarcts in the left basal ganglia vs artifact.      07/31/2015 Pathology Results    Peripheral blood is positive for JAK2 mutation    07/31/2015 -  Chemotherapy    She is started on 500 mg daily Hydrea    08/15/2015 Miscellaneous    The dose of hydroxyurea is increased to 1000 mg daily    09/26/2015 Miscellaneous    Dose of Hydrea to 500 mg daily except on Saturdays and Sundays she takes 1000 mg.    10/25/2015 Adverse Reaction    Dose of Hydrea is reduced to 500 mg daily     10 /04/2016 Miscellaneous    From 2017 to 2018, the dose of Hydrea is further adjusted.     REVIEW OF SYSTEMS: Per daughter, unremarkable  I have reviewed the past medical history, past surgical history, social history  and family history with the patient and they are unchanged from previous note.  ALLERGIES:  is allergic to valtrex [valacyclovir hcl].  MEDICATIONS:  Current Outpatient Medications  Medication Sig Dispense Refill  . acetaminophen (TYLENOL) 160 MG/5ML liquid Take 480 mg by mouth every 4 (four) hours as needed for fever or pain.    . calamine lotion Apply 1 application as needed topically for itching. 120 mL 0  . ciclopirox (PENLAC) 8 %  solution Apply topically at bedtime. Apply over nail & surrounding skin. Apply Qd over previous coat. After 7 days, remove w alcohol, continue cycle. 6.6 mL 5  . diclofenac sodium (VOLTAREN) 1 % GEL APPLY (2GMS) TOPICALLY THREE TIMES DAILY. (Patient taking differently: Apply 2 g topically 3 (three) times daily as needed. ) 300 g 5  . hydroxyurea (HYDREA) 500 MG capsule Take 1 tablet (500 mg) daily except 2 tablets ( 1000 mg ) on Wednesday, Saturday and Sunday 120 capsule 3  . ipratropium-albuterol (DUONEB) 0.5-2.5 (3) MG/3ML SOLN Take 3 mLs by nebulization every 6 (six) hours as needed. (Patient taking differently: Take 3 mLs by nebulization every 6 (six) hours as needed (shortness of breath). ) 360 mL 0  . lidocaine (LIDODERM) 5 % Place 1 patch daily onto the skin. Remove & Discard patch within 12 hours or as directed by MD (Patient taking differently: Place 1 patch onto the skin daily as needed (pain). Remove & Discard patch within 12 hours or as directed by MD) 15 patch 0  . methimazole (TAPAZOLE) 5 MG tablet TAKE 1 TABLET BY MOUTH TWICE WEEKLY (Patient taking differently: Take 5 mg by mouth 2 (two) times a week. TAKE 1 TABLET BY MOUTH TWICE WEEKLY) 12 tablet 1  . NONFORMULARY OR COMPOUNDED ITEM Topical cream with equal parts diclofenac, gabapentin, lidocaine, menthol  Disp 100 gm 1 each 5  . Oral Hygiene Products (Q-CARE COVERD YANKAUER/SUCTION) MISC Use as directed 10 each 5  . OXYGEN 2lpm 24/7  DME- [facility]    . polyethylene glycol (MIRALAX / GLYCOLAX) packet TAKE 17G BY MOUTH TWICE A DAY (Patient taking differently: Take 17 g by mouth 2 (two) times daily as needed for mild constipation. ) 14 packet 1  . pregabalin (LYRICA) 25 MG capsule Take 1 capsule (25 mg total) by mouth 3 (three) times daily. 90 capsule 2  . Probiotic Product (ALIGN) 4 MG CAPS Take 4 mg by mouth daily.     . propranolol (INDERAL) 10 MG tablet Take 0.5 tablets (5 mg total) by mouth 2 (two) times daily. 60 tablet 5  . PROTONIX  40 MG PACK TAKE 20 MLS (40 MG TOTAL) BY MOUTH DAILY. 40 mL 5  . terconazole (TERAZOL 7) 0.4 % vaginal cream Place 1 applicator vaginally at bedtime. (Patient taking differently: Place 1 applicator vaginally at bedtime as needed (irritation). ) 90 g 3  . traMADol (ULTRAM) 50 MG tablet TAKE 2 TABLETS BY MOUTH 3 TIMES A DAY AS NEEDED 180 tablet 1  . traZODone (DESYREL) 50 MG tablet TAKE 1 TABLET (50 MG TOTAL) BY MOUTH AT BEDTIME AS NEEDED. FOR SLEEP 30 tablet 5  . warfarin (COUMADIN) 4 MG tablet Take 1 tablet daily except 1/2 on Sunday and thursdays or Take as directed by anticoagulation clinic. 40 tablet 3   No current facility-administered medications for this visit.     PHYSICAL EXAMINATION: ECOG PERFORMANCE STATUS: 3 - Symptomatic, >50% confined to bed  Vitals:   12/06/17 1510  BP: 129/73  Pulse: 85  Resp:  18  Temp: 97.8 F (36.6 C)  SpO2: 98%   There were no vitals filed for this visit.  GENERAL:alert, no distress and comfortable.  She looks comfortable, sitting on the wheelchair  sKIN: skin color, texture, turgor are normal, no rashes or significant lesions EYES: normal, Conjunctiva are pink and non-injected, sclera clear OROPHARYNX:no exudate, no erythema and lips, buccal mucosa, and tongue normal  NECK: supple, thyroid normal size, non-tender, without nodularity.  Noted distended veins LYMPH:  no palpable lymphadenopathy in the cervical, axillary or inguinal LUNGS: clear to auscultation and percussion with normal breathing effort HEART: regular rate & rhythm and no murmurs with bilateral lower extremity edema and right upper extremity edema ABDOMEN:abdomen soft, non-tender and normal bowel sounds Musculoskeletal:no cyanosis of digits and no clubbing  NEURO: alert with dysphagia, focal neurological deficit, stable from before, affecting her right side  LABORATORY DATA:  I have reviewed the data as listed    Component Value Date/Time   NA 140 10/05/2017 1431   NA 142  10/06/2016 1352   K 3.8 10/05/2017 1431   K 4.1 10/06/2016 1352   CL 106 10/05/2017 1431   CO2 28 10/05/2017 1431   CO2 27 10/06/2016 1352   GLUCOSE 93 10/05/2017 1431   GLUCOSE 90 10/06/2016 1352   BUN 8 10/05/2017 1431   BUN 12.9 10/06/2016 1352   CREATININE 0.33 (L) 10/05/2017 1431   CREATININE 0.6 10/06/2016 1352   CALCIUM 8.7 (L) 10/05/2017 1431   CALCIUM 9.4 10/06/2016 1352   PROT 5.9 (L) 10/05/2017 1431   ALBUMIN 2.8 (L) 10/05/2017 1431   AST 13 (L) 10/05/2017 1431   ALT 9 10/05/2017 1431   ALKPHOS 65 10/05/2017 1431   BILITOT 0.9 10/05/2017 1431   GFRNONAA >60 10/05/2017 1431   GFRAA >60 10/05/2017 1431    No results found for: SPEP, UPEP  Lab Results  Component Value Date   WBC 5.7 12/06/2017   NEUTROABS 4.3 12/06/2017   HGB 13.4 12/06/2017   HCT 41.7 12/06/2017   MCV 119.5 (H) 12/06/2017   PLT 411 (H) 12/06/2017      Chemistry      Component Value Date/Time   NA 140 10/05/2017 1431   NA 142 10/06/2016 1352   K 3.8 10/05/2017 1431   K 4.1 10/06/2016 1352   CL 106 10/05/2017 1431   CO2 28 10/05/2017 1431   CO2 27 10/06/2016 1352   BUN 8 10/05/2017 1431   BUN 12.9 10/06/2016 1352   CREATININE 0.33 (L) 10/05/2017 1431   CREATININE 0.6 10/06/2016 1352      Component Value Date/Time   CALCIUM 8.7 (L) 10/05/2017 1431   CALCIUM 9.4 10/06/2016 1352   ALKPHOS 65 10/05/2017 1431   AST 13 (L) 10/05/2017 1431   ALT 9 10/05/2017 1431   BILITOT 0.9 10/05/2017 1431      All questions were answered. The patient knows to call the clinic with any problems, questions or concerns. No barriers to learning was detected.  I spent 15 minutes counseling the patient face to face. The total time spent in the appointment was 20 minutes and more than 50% was on counseling and review of test results  Heath Lark, MD 12/06/2017 3:45 PM

## 2017-12-07 ENCOUNTER — Ambulatory Visit (INDEPENDENT_AMBULATORY_CARE_PROVIDER_SITE_OTHER): Payer: Medicare Other | Admitting: General Practice

## 2017-12-07 DIAGNOSIS — I82593 Chronic embolism and thrombosis of other specified deep vein of lower extremity, bilateral: Secondary | ICD-10-CM | POA: Diagnosis not present

## 2017-12-07 DIAGNOSIS — Z7901 Long term (current) use of anticoagulants: Secondary | ICD-10-CM | POA: Diagnosis not present

## 2017-12-07 NOTE — Progress Notes (Signed)
Agree with management.  Fredia Chittenden J Melaysia Streed, MD  

## 2017-12-07 NOTE — Patient Instructions (Signed)
Pre visit review using our clinic review tool, if applicable. No additional management support is needed unless otherwise documented below in the visit note.  Lab resulted at [facility].  Patient's daughter has been given dosing instructions to continue to take 1 tablet every day except 1/2 tablet on Sundays and Thursdays.  Re-check in 4 weeks.  She verbalized understanding.

## 2017-12-13 ENCOUNTER — Other Ambulatory Visit: Payer: Self-pay

## 2017-12-13 MED ORDER — AMOXICILLIN-POT CLAVULANATE 875-125 MG PO TABS: 1.0000 | ORAL_TABLET | Freq: Two times a day (BID) | ORAL | 0 refills | Status: DC

## 2017-12-13 NOTE — Telephone Encounter (Signed)
Rx sent daughter is aware.

## 2017-12-13 NOTE — Telephone Encounter (Signed)
We can do the augmentin again - pending.

## 2017-12-13 NOTE — Telephone Encounter (Signed)
Pts daughter called and left message concerned that her mother has UTI. She states it is indicated by her talking to herself in the room at night and the odor of the urine. She is asking if you can send in an antibiotic. Please advise.

## 2017-12-21 ENCOUNTER — Telehealth: Payer: Self-pay | Admitting: Internal Medicine

## 2017-12-21 NOTE — Telephone Encounter (Signed)
Patient states per Dr. In [facility] they are needing lab work for thyroid function. Please Advise, thanks

## 2017-12-21 NOTE — Telephone Encounter (Signed)
OK to send the ones available. Do they need new labs?

## 2017-12-23 ENCOUNTER — Telehealth: Payer: Self-pay

## 2017-12-23 DIAGNOSIS — G8929 Other chronic pain: Secondary | ICD-10-CM

## 2017-12-23 DIAGNOSIS — M25519 Pain in unspecified shoulder: Principal | ICD-10-CM

## 2017-12-23 NOTE — Addendum Note (Signed)
Addended by: [name] on: 12/23/2017 09:14 PM   Modules accepted: Orders

## 2017-12-23 NOTE — Telephone Encounter (Signed)
Referral ordered

## 2017-12-23 NOTE — Telephone Encounter (Signed)
Pts daughter called and asked if you would recommend another pain management doctor. I told her if she wanted to go to another pain management then another referral would probably have to be put in and it will take a while to hear back from.

## 2017-12-24 ENCOUNTER — Ambulatory Visit: Payer: Medicare Other | Admitting: Physical Medicine & Rehabilitation

## 2017-12-24 NOTE — Telephone Encounter (Signed)
Pts daughter aware of response.  

## 2017-12-27 DIAGNOSIS — E049 Nontoxic goiter, unspecified: Secondary | ICD-10-CM | POA: Diagnosis not present

## 2017-12-27 DIAGNOSIS — R918 Other nonspecific abnormal finding of lung field: Secondary | ICD-10-CM | POA: Diagnosis not present

## 2017-12-27 DIAGNOSIS — E042 Nontoxic multinodular goiter: Secondary | ICD-10-CM | POA: Diagnosis not present

## 2017-12-27 DIAGNOSIS — E0590 Thyrotoxicosis, unspecified without thyrotoxic crisis or storm: Secondary | ICD-10-CM | POA: Diagnosis not present

## 2017-12-27 DIAGNOSIS — E048 Other specified nontoxic goiter: Secondary | ICD-10-CM | POA: Diagnosis not present

## 2017-12-29 ENCOUNTER — Other Ambulatory Visit: Payer: Self-pay | Admitting: Endocrinology

## 2018-01-06 ENCOUNTER — Telehealth: Payer: Self-pay

## 2018-01-06 ENCOUNTER — Other Ambulatory Visit: Payer: Self-pay | Admitting: Endocrinology

## 2018-01-06 MED ORDER — NITROFURANTOIN MONOHYD MACRO 100 MG PO CAPS: 100.0000 mg | ORAL_CAPSULE | Freq: Two times a day (BID) | ORAL | 0 refills | Status: DC

## 2018-01-06 NOTE — Telephone Encounter (Signed)
Daughter aware of response below.  

## 2018-01-06 NOTE — Telephone Encounter (Signed)
Daughter called and states that pt is having auditory hallucinations (hearing things that aren't there) and strong smelling urine and vaginal irritation. Thinks she still has a UTI. Wanted to see about getting more of the same antibiotics or needing something else or if there are any other recommendations you suggest at this point. Please advise.

## 2018-01-06 NOTE — Telephone Encounter (Signed)
Lets try a different antibiotic - my concern is she may be developing some resistance the the augmentin.     nitrofurantoin sent to [facility]

## 2018-01-15 ENCOUNTER — Other Ambulatory Visit: Payer: Self-pay | Admitting: Internal Medicine

## 2018-01-18 ENCOUNTER — Encounter: Payer: Self-pay | Admitting: Podiatry

## 2018-01-18 ENCOUNTER — Ambulatory Visit (INDEPENDENT_AMBULATORY_CARE_PROVIDER_SITE_OTHER): Payer: Medicare Other | Admitting: Podiatry

## 2018-01-18 DIAGNOSIS — M79675 Pain in left toe(s): Secondary | ICD-10-CM

## 2018-01-18 DIAGNOSIS — M79674 Pain in right toe(s): Secondary | ICD-10-CM

## 2018-01-18 DIAGNOSIS — B351 Tinea unguium: Secondary | ICD-10-CM

## 2018-01-18 DIAGNOSIS — D689 Coagulation defect, unspecified: Secondary | ICD-10-CM

## 2018-01-18 NOTE — Progress Notes (Signed)
Complaint:  Visit Type: Patient returns to my office for continued preventative foot care services.  She is accompanied by her daughter.   Complaint: Patient daughter says her mothers nails  have grown long and thick and become painful  Wearing shoes. shoes" Patient has been diagnosed with CVA.  . The patient presents for preventative foot care services. No changes to ROS.  Patient is taking coumadin.  Podiatric Exam: Vascular: dorsalis pedis and posterior tibial pulses are palpable bilateral. Capillary return is immediate. Temperature gradient is WNL. Skin turgor WNL  Sensorium: Deferred since patient is unable to communicate. Nail Exam: Pt has thick disfigured discolored nails with subungual debris noted bilateral entire nail hallux through fifth toenails Ulcer Exam: There is no evidence of ulcer or pre-ulcerative changes or infection. Orthopedic Exam: Muscle tone and strength are WNL. No limitations in general ROM. No crepitus or effusions noted. Foot type and digits show no abnormalities. Bony prominences are unremarkable. Skin: No Porokeratosis. No infection or ulcers  Diagnosis:  Onychomycosis, , Pain in right toe, pain in left toes  Treatment & Plan Procedures and Treatment: Consent by patient was obtained for treatment procedures.   Debridement of mycotic and hypertrophic toenails, 1 through 5 bilateral and clearing of subungual debris. No ulceration, no infection noted. ABN signed for 2020.[name] Return Visit-Office Procedure: Patient instructed to return to the office for a follow up visit 3 months for continued evaluation and treatment.    Gardiner Barefoot DPM

## 2018-01-21 ENCOUNTER — Ambulatory Visit (INDEPENDENT_AMBULATORY_CARE_PROVIDER_SITE_OTHER): Payer: Medicare Other | Admitting: General Practice

## 2018-01-21 DIAGNOSIS — I824Y3 Acute embolism and thrombosis of unspecified deep veins of proximal lower extremity, bilateral: Secondary | ICD-10-CM

## 2018-01-21 DIAGNOSIS — Z7901 Long term (current) use of anticoagulants: Secondary | ICD-10-CM

## 2018-01-21 LAB — POCT INR: INR: 2.5 (ref 2.0–3.0)

## 2018-01-21 NOTE — Patient Instructions (Addendum)
Pre visit review using our clinic review tool, if applicable. No additional management support is needed unless otherwise documented below in the visit note.  Continue to take 1 tablet every day except 1/2 tablet on Sundays and Thursdays.  Patient will have labs at the cancer center the 1st week of March.

## 2018-01-22 NOTE — Progress Notes (Signed)
Agree with management.  Stacy J Burns, MD  

## 2018-01-24 ENCOUNTER — Encounter: Payer: Medicare Other | Attending: Physical Medicine & Rehabilitation

## 2018-01-24 ENCOUNTER — Encounter

## 2018-01-24 ENCOUNTER — Encounter: Payer: Self-pay | Admitting: Physical Medicine & Rehabilitation

## 2018-01-24 ENCOUNTER — Ambulatory Visit (HOSPITAL_BASED_OUTPATIENT_CLINIC_OR_DEPARTMENT_OTHER): Payer: Medicare Other | Admitting: Physical Medicine & Rehabilitation

## 2018-01-24 VITALS — BP 112/81 | HR 77 | Ht 59.0 in | Wt 178.0 lb

## 2018-01-24 DIAGNOSIS — G8110 Spastic hemiplegia affecting unspecified side: Secondary | ICD-10-CM | POA: Insufficient documentation

## 2018-01-24 DIAGNOSIS — M25562 Pain in left knee: Secondary | ICD-10-CM

## 2018-01-24 DIAGNOSIS — G8929 Other chronic pain: Secondary | ICD-10-CM | POA: Diagnosis not present

## 2018-01-24 NOTE — Progress Notes (Signed)
Dysport Injection for spasticity using needle EMG guidance  Dilution: 200 Units/ml Indication: Severe spasticity which interferes with ADL,mobility and/or  hygiene and is unresponsive to medication management and other conservative care Informed consent was obtained after describing risks and benefits of the procedure with the patient. This includes bleeding, bruising, infection, excessive weakness, or medication side effects. A REMS form is on file and signed. Needle:  needle electrode Number of units per muscle PQ100 FDP to index  100- difficult to isolate Pectoralis 200 FPL 100U All injections done after neg drawback for blood and appropriate EMG activity.   Pt tolerated procedure well.    Complaining of increasing Left knee pain, Last injection >56mo ago will schedule repeat

## 2018-01-24 NOTE — Patient Instructions (Signed)

## 2018-02-03 ENCOUNTER — Other Ambulatory Visit: Payer: Self-pay | Admitting: Internal Medicine

## 2018-02-03 NOTE — Telephone Encounter (Signed)
Check [place] registry last filled 12/31/2017../lmb  

## 2018-02-07 ENCOUNTER — Telehealth: Payer: Self-pay

## 2018-02-07 NOTE — Telephone Encounter (Signed)
Patients daughter theresa Shutters called in wanting to know if they could go back to the tramadol being 4 times daily. Daughter states patient is still in pain with only doing the three times daily. I apologize I now see it is two pills three times daily I failed to ask if patient was taking it that way or only 1 tab three times daily.

## 2018-02-07 NOTE — Telephone Encounter (Signed)
Left detailed message on VM per DPR with response below. Asked daughter to call back and let us know what she would like to do.

## 2018-02-07 NOTE — Addendum Note (Signed)
Addended by: [name] on: 02/07/2018 12:07 PM   Modules accepted: Orders

## 2018-02-07 NOTE — Telephone Encounter (Signed)
She is already at the maximum dose so we can not increase further.  The lyrica could be increased

## 2018-02-07 NOTE — Telephone Encounter (Signed)
LVM for pts daughter to call back in regards.

## 2018-02-07 NOTE — Telephone Encounter (Signed)
Daughter would like know if she can increase to taking 2 tramadol 4 times daily instead of 3 times daily due to mother being in pain. Please advise.

## 2018-02-08 ENCOUNTER — Other Ambulatory Visit: Payer: Self-pay

## 2018-02-08 MED ORDER — PREGABALIN 50 MG PO CAPS: 50.0000 mg | ORAL_CAPSULE | Freq: Three times a day (TID) | ORAL | 0 refills | Status: DC

## 2018-02-08 NOTE — Telephone Encounter (Signed)
Daughter was ok with increasing lyrica. Pended rx.

## 2018-02-08 NOTE — Telephone Encounter (Signed)
Can try increasing to 50 mg 3 times a day.  Monitor for increased confusion.

## 2018-02-08 NOTE — Telephone Encounter (Signed)
Copied from [employer] [id number]. Topic: General - Other >> Feb 08, 2018  9:20 AM Carolyn Stare wrote:  Sandy Barrett pt daughter call to say they would like to try the increase in pregabalin (LYRICA) 25 MG capsule   [facility]

## 2018-02-22 DIAGNOSIS — I6522 Occlusion and stenosis of left carotid artery: Secondary | ICD-10-CM | POA: Diagnosis not present

## 2018-02-22 DIAGNOSIS — E041 Nontoxic single thyroid nodule: Secondary | ICD-10-CM | POA: Diagnosis not present

## 2018-02-22 DIAGNOSIS — E048 Other specified nontoxic goiter: Secondary | ICD-10-CM | POA: Diagnosis not present

## 2018-02-22 DIAGNOSIS — G9389 Other specified disorders of brain: Secondary | ICD-10-CM | POA: Diagnosis not present

## 2018-02-23 ENCOUNTER — Other Ambulatory Visit: Payer: Self-pay | Admitting: Internal Medicine

## 2018-02-28 ENCOUNTER — Ambulatory Visit (HOSPITAL_BASED_OUTPATIENT_CLINIC_OR_DEPARTMENT_OTHER): Payer: Medicare Other | Admitting: Physical Medicine & Rehabilitation

## 2018-02-28 ENCOUNTER — Encounter: Payer: Self-pay | Admitting: Physical Medicine & Rehabilitation

## 2018-02-28 ENCOUNTER — Encounter: Payer: Medicare Other | Attending: Physical Medicine & Rehabilitation

## 2018-02-28 VITALS — BP 153/76 | HR 63 | Resp 14

## 2018-02-28 DIAGNOSIS — M1711 Unilateral primary osteoarthritis, right knee: Secondary | ICD-10-CM

## 2018-02-28 DIAGNOSIS — G8110 Spastic hemiplegia affecting unspecified side: Secondary | ICD-10-CM

## 2018-02-28 NOTE — Patient Instructions (Signed)
If Left knee or Right shoulder pain worsen, we can do a repeat injection in March

## 2018-02-28 NOTE — Progress Notes (Signed)
Subjective:    Patient ID: Sandy Barrett, female    DOB: [birth year], 85 y.o.   MRN: [id number]  HPI Follow up after Dysport injection 01/24/2018 Patient's daughter states that the injection was very helpful to relieve spasticity in the right upper extremity. PQ100 FDP to index  100- difficult to isolate Pectoralis 200 FPL 100U  Chief complaint is difficult to elicit because of severe global aphasia however according to daughter patient winces when she puts weight on the right lower extremity. Patient has a history of osteoarthritis of the knee and is undergone right knee injection in July 2019.  Had left knee injection in March 2019. No falls no new trauma.  Shoulder continues to be stiff although better after the Botox. Pain Inventory Average Pain 6 Pain Right Now 8 My pain is intermittent, constant, sharp, burning, dull, stabbing, tingling and aching  In the last 24 hours, has pain interfered with the following? General activity 8 Relation with others 8 Enjoyment of life 8 What TIME of day is your pain at its worst? morning Sleep (in general) Fair  Pain is worse with: bending and standing Pain improves with: rest, therapy/exercise, medication and injections Relief from Meds: 5  Mobility ability to climb steps?  no do you drive?  no use a wheelchair needs help with transfers Do you have any goals in this area?  no  Function retired I need assistance with the following:  feeding, dressing, bathing, toileting, meal prep, household duties and shopping  Neuro/Psych Cannot assess secondary to aphasia  Prior Studies Any changes since last visit?  no  Physicians involved in your care Any changes since last visit?  no   Family History  Problem Relation Age of Onset  . Asthma Mother   . Asthma Father   . Prostate cancer Father   . Stroke Brother   . Breast cancer Sister   . Stomach cancer Sister   . Thyroid disease Neg Hx    Social History   Socioeconomic  History  . Marital status: Widowed    Spouse name: Not on file  . Number of children: 2  . Years of education: 22  . Highest education level: Not on file  Occupational History  . Occupation: retired    Comment: Restaurant manager, fast food  Social Needs  . Financial resource strain: Not on file  . Food insecurity:    Worry: Not on file    Inability: Not on file  . Transportation needs:    Medical: Not on file    Non-medical: Not on file  Tobacco Use  . Smoking status: Never Smoker  . Smokeless tobacco: Never Used  Substance and Sexual Activity  . Alcohol use: No    Alcohol/week: 0.0 standard drinks  . Drug use: No  . Sexual activity: Never  Lifestyle  . Physical activity:    Days per week: Not on file    Minutes per session: Not on file  . Stress: Not on file  Relationships  . Social connections:    Talks on phone: Not on file    Gets together: Not on file    Attends religious service: Not on file    Active member of club or organization: Not on file    Attends meetings of clubs or organizations: Not on file    Relationship status: Not on file  Other Topics Concern  . Not on file  Social History Narrative   Widowed, lived alone prior to CVA 03/2014   SNF  at [facility], then home 07/2014 with dtr   Right handed   Caffeine use- occasionally drinks tea   Past Surgical History:  Procedure Laterality Date  . ABDOMINAL HYSTERECTOMY  1976  . BREAST SURGERY     biopsy  . RADIOLOGY WITH ANESTHESIA Left 03/08/2014   Procedure: RADIOLOGY WITH ANESTHESIA;  Surgeon: Rob Hickman, MD;  Location: [place];  Service: Radiology;  Laterality: Left;  . TONSILLECTOMY    . TOTAL HIP ARTHROPLASTY  1998    right   Past Medical History:  Diagnosis Date  . ARTHRITIS   . Arthritis   . Diverticulosis   . Essential thrombocytosis ([place])   . HYPERTENSION   . HYPERTHYROIDISM   . Hyperthyroidism    s/p I-131 ablation 03/2011 of multinod goiter  . INSOMNIA   . MIGRAINE HEADACHE   .  OBESITY   . Posttraumatic stress disorder   . Pulmonary embolism ([place]) 03/2014   with DVT  . Stroke ([place]) 03/2014   dysarthria   BP (!) 153/76   Pulse 63   Resp 14   SpO2 94%   Opioid Risk Score:   Fall Risk Score:  `1  Depression screen PHQ 2/9  Depression screen [facility] 2/9 08/17/2017 07/20/2017 08/21/2016  Decreased Interest 0 0 0  Down, Depressed, Hopeless 0 0 0  PHQ - 2 Score 0 0 0  Some recent data might be hidden    Review of Systems  Constitutional: Negative.   HENT: Negative.   Eyes: Negative.   Respiratory: Negative.   Cardiovascular: Negative.   Gastrointestinal:       Incontinence  Endocrine: Negative.   Genitourinary:       Incontinence  Musculoskeletal: Positive for arthralgias and gait problem.       Spasms   Skin: Negative.   Allergic/Immunologic: Negative.   Neurological: Positive for weakness.  Hematological: Negative.   Psychiatric/Behavioral: Negative.        Objective:   Physical Exam  Motor strength is essentially 0/5 in the right upper extremity Tone MAS 1 at the pectoralis MAS 1 at the biceps MAS 1 at the finger flexors MAS 0 at the wrist flexors Right lower extremity to minus at the knee extensors  Right knee has pain with range of motion both with flexion and with extension. There is tenderness along the medial greater than lateral joint line on the right side. Patient has no wincing with range of motion of the left knee although she does appear mildly uncomfortable, frowns      Assessment & Plan:  1.  Left MCA distribution infarct with global aphasia and right spastic hemiplegia.  Has had good response to Dysport injections.  I would recommend repeating the same muscle groups and dosing in 6 weeks PQ100 FDP to index  100- Pectoralis 200 FPL 100U  2.  Right knee osteoarthritis pain during weightbearing no recent falls recommend repeat Right knee injection   Indication:RIght Knee pain not relieved by medication management and other  conservative care.  Informed consent was obtained after describing risks and benefits of the procedure with the patient, this includes bleeding, bruising, infection and medication side effects. The patient wishes to proceed and has given written consent. The patient was placed in a recumbent position. The medial aspect of the knee was marked and prepped with Betadine and alcohol. It was then entered with a 25-gauge 1-1/2 inch needle  inserted into the knee joint. After negative draw back for blood, a solution containing  one ML of 6mg  per mL betamethasone and 3 mL of 1% lidocaine were injected. The patient tolerated the procedure well. Post procedure instructions were given.

## 2018-03-01 DIAGNOSIS — E049 Nontoxic goiter, unspecified: Secondary | ICD-10-CM | POA: Diagnosis not present

## 2018-03-04 ENCOUNTER — Telehealth: Payer: Self-pay | Admitting: Internal Medicine

## 2018-03-04 NOTE — Telephone Encounter (Signed)
I left a message for Sandy Barrett at [facility] per her request.  She will look into it and get back to me.  [facility] was trying to bill for oxygen and suction and needed a valid respiratory diagnosis code.

## 2018-03-08 NOTE — Telephone Encounter (Signed)
ATC unable to reach threw to [employer]

## 2018-03-08 NOTE — Progress Notes (Signed)
Subjective:    Patient ID: Sandy Barrett, female    DOB: [birth year], 85 y.o.   MRN: [id number]  HPI The patient is here for an acute visit. She is here with her daughter who provides the history since she is nonverbal.   Her daughter noticed that she was pressing on her right eye and applying pressure intermittently for the past three weeks.  At one point she thought the right eye was more prominent or protruding more than the left.  She denies any eye discharge or redness.  She is still sensitive to light and has her daughter close the blinds soemtimes - these two symptoms are not necessary related.  Nothing visible wrong with her vision per daughter.      Thyroid goiter:  She did see endocrine at [facility] and will have an embolization of her thyroid gland next month.  She will be under moderate general anesthesia and will need to stop her warfarin transiently.  She will be on lovenox or heparin.     Medications and allergies reviewed with patient and updated if appropriate.  Patient Active Problem List   Diagnosis Date Noted  . Chronic pain of left knee 01/24/2018  . Rash and nonspecific skin eruption 11/24/2017  . Right shoulder pain 11/24/2017  . Hallucinations 11/24/2017  . Adhesive capsulitis of right shoulder 11/23/2017  . Bilateral leg edema 05/24/2017  . Cholecystitis 02/23/2017  . Chronic respiratory failure with hypoxia ([place]) 02/23/2017  . Neuralgia 01/09/2017  . Abnormal urine odor 10/28/2016  . Venous congestion 10/08/2016  . Lymphedema of breast 09/11/2016  . Long term (current) use of anticoagulants [Z79.01] 09/10/2016  . Left hip pain 08/09/2016  . Left leg swelling 05/18/2016  . Aspiration pneumonia ([place]) 04/20/2016  . Hemidiaphragm paralysis   . Wheelchair bound 02/03/2016  . Discoloration of skin 09/26/2015  . Essential thrombocytosis ([place]) 07/31/2015  . SVC (superior vena cava obstruction), chronic 07/20/2015  . Dysphagia 07/20/2015  . Constipation  07/20/2015  . Thrombophlebitis of breast, right 06/18/2015  . Tracheal deviation 06/06/2015  . Sensorineural hearing loss, bilateral, moderate-moderately severe 04/30/2015  . Abdominal wall lump 03/09/2015  . Frequent UTI 02/06/2015  . Primary osteoarthritis involving multiple joints 07/26/2014  . Pernicious anemia 07/26/2014  . Encounter for therapeutic drug monitoring 07/17/2014  . Spastic hemiparesis affecting dominant side ([place]), right 05/31/2014  . DVT of lower extremity, bilateral ([place]) 03/14/2014  . Global aphasia 03/14/2014  . 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 Aphasia S/P CVA 03/13/2014  . Cerebral infarction due to embolism of left middle cerebral artery ([place])   . Stroke, embolic ([place]) 76/72/0947  . History of pulmonary embolism 03/08/2014  . Primary localized osteoarthrosis, lower leg 03/06/2013  . IBS (irritable bowel syndrome)   . Multinodular goiter 01/13/2011  . Hyperthyroidism 11/11/2009  . OBESITY 04/21/2008  . Osteoarthritis 04/21/2008  . Migraine headache 04/20/2008  . Essential hypertension 04/20/2008    Current Outpatient Medications on File Prior to Visit  Medication Sig Dispense Refill  . acetaminophen (TYLENOL) 160 MG/5ML liquid Take 480 mg by mouth every 4 (four) hours as needed for fever or pain.    . calamine lotion Apply 1 application as needed topically for itching. 120 mL 0  . chlorhexidine (PERIDEX) 0.12 % solution RINSE TWICE A DAY FOR 1 MINUTE  5  . ciclopirox (PENLAC) 8 % solution Apply topically at bedtime. Apply over nail & surrounding skin. Apply Qd over previous coat. After 7 days, remove w alcohol, continue  cycle. 6.6 mL 5  . diclofenac sodium (VOLTAREN) 1 % GEL APPLY (2GMS) TOPICALLY THREE TIMES DAILY. (Patient taking differently: Apply 2 g topically 3 (three) times daily as needed. ) 300 g 5  . fluconazole (DIFLUCAN) 150 MG tablet TAKE 1 TABLET BY MOUTH AS ONE DOSE 1 tablet 0  . hydroxyurea (HYDREA) 500 MG capsule Take 1 tablet (500 mg)  daily except 2 tablets ( 1000 mg ) on Wednesday, Saturday and Sunday 120 capsule 3  . ipratropium-albuterol (DUONEB) 0.5-2.5 (3) MG/3ML SOLN Take 3 mLs by nebulization every 6 (six) hours as needed. (Patient taking differently: Take 3 mLs by nebulization every 6 (six) hours as needed (shortness of breath). ) 360 mL 0  . lidocaine (LIDODERM) 5 % Place 1 patch daily onto the skin. Remove & Discard patch within 12 hours or as directed by MD (Patient taking differently: Place 1 patch onto the skin daily as needed (pain). Remove & Discard patch within 12 hours or as directed by MD) 15 patch 0  . methimazole (TAPAZOLE) 5 MG tablet Take 1 tablet (5 mg total) by mouth 2 (two) times a week. TAKE 1 TABLET BY MOUTH TWICE WEEKLY 12 tablet 1  . NONFORMULARY OR COMPOUNDED ITEM Topical cream with equal parts diclofenac, gabapentin, lidocaine, menthol  Disp 100 gm 1 each 5  . Oral Hygiene Products (Q-CARE COVERD [place]AUER/SUCTION) MISC Use as directed 10 each 5  . OXYGEN 2lpm 24/7  DME- [facility]    . polyethylene glycol (MIRALAX / GLYCOLAX) packet TAKE 17G BY MOUTH TWICE A DAY (Patient taking differently: Take 17 g by mouth 2 (two) times daily as needed for mild constipation. ) 14 packet 1  . pregabalin (LYRICA) 25 MG capsule Take 1 capsule (25 mg total) by mouth 3 (three) times daily. 90 capsule 2  . pregabalin (LYRICA) 50 MG capsule Take 1 capsule (50 mg total) by mouth 3 (three) times daily. 90 capsule 0  . Probiotic Product (ALIGN) 4 MG CAPS Take 4 mg by mouth daily.     . propranolol (INDERAL) 10 MG tablet Take 0.5 tablets (5 mg total) by mouth 2 (two) times daily. 60 tablet 5  . PROTONIX 40 MG PACK TAKE 20 MLS (40 MG TOTAL) BY MOUTH DAILY. 40 mL 5  . terconazole (TERAZOL 7) 0.4 % vaginal cream Place 1 applicator vaginally at bedtime. (Patient taking differently: Place 1 applicator vaginally at bedtime as needed (irritation). ) 90 g 3  . traMADol (ULTRAM) 50 MG tablet TAKE 2 TABLETS BY MOUTH 3 TIMES A DAY AS NEEDED  180 tablet 1  . traZODone (DESYREL) 50 MG tablet TAKE 1 TABLET (50 MG TOTAL) BY MOUTH AT BEDTIME AS NEEDED. FOR SLEEP 30 tablet 5  . warfarin (COUMADIN) 4 MG tablet Take 1 tablet daily except 1/2 on Sunday and thursdays or Take as directed by anticoagulation clinic.  90 day 90 tablet 1   No current facility-administered medications on file prior to visit.     Past Medical History:  Diagnosis Date  . ARTHRITIS   . Arthritis   . Diverticulosis   . Essential thrombocytosis ([place])   . HYPERTENSION   . HYPERTHYROIDISM   . Hyperthyroidism    s/p I-131 ablation 03/2011 of multinod goiter  . INSOMNIA   . MIGRAINE HEADACHE   . OBESITY   . Posttraumatic stress disorder   . Pulmonary embolism ([place]) 03/2014   with DVT  . Stroke [facility]) 03/2014   dysarthria    Past Surgical History:  Procedure Laterality Date  . ABDOMINAL HYSTERECTOMY  1976  . BREAST SURGERY     biopsy  . RADIOLOGY WITH ANESTHESIA Left 03/08/2014   Procedure: RADIOLOGY WITH ANESTHESIA;  Surgeon: Rob Hickman, MD;  Location: [place];  Service: Radiology;  Laterality: Left;  . TONSILLECTOMY    . TOTAL HIP ARTHROPLASTY  1998    right    Social History   Socioeconomic History  . Marital status: Widowed    Spouse name: Not on file  . Number of children: 2  . Years of education: 70  . Highest education level: Not on file  Occupational History  . Occupation: retired    Comment: Restaurant manager, fast food  Social Needs  . Financial resource strain: Not on file  . Food insecurity:    Worry: Not on file    Inability: Not on file  . Transportation needs:    Medical: Not on file    Non-medical: Not on file  Tobacco Use  . Smoking status: Never Smoker  . Smokeless tobacco: Never Used  Substance and Sexual Activity  . Alcohol use: No    Alcohol/week: 0.0 standard drinks  . Drug use: No  . Sexual activity: Never  Lifestyle  . Physical activity:    Days per week: Not on file    Minutes per session: Not on file  . Stress:  Not on file  Relationships  . Social connections:    Talks on phone: Not on file    Gets together: Not on file    Attends religious service: Not on file    Active member of club or organization: Not on file    Attends meetings of clubs or organizations: Not on file    Relationship status: Not on file  Other Topics Concern  . Not on file  Social History Narrative   Widowed, lived alone prior to CVA 03/2014   SNF at [facility], then home 07/2014 with dtr   Right handed   Caffeine use- occasionally drinks tea    Family History  Problem Relation Age of Onset  . Asthma Mother   . Asthma Father   . Prostate cancer Father   . Stroke Brother   . Breast cancer Sister   . Stomach cancer Sister   . Thyroid disease Neg Hx     Review of Systems  Unable to perform ROS: Patient nonverbal       Objective:   Vitals:   03/09/18 1426  BP: 126/64  Resp: 16  Temp: 98.1 F (36.7 C)   BP Readings from Last 3 Encounters:  03/09/18 126/64  02/28/18 (!) 153/76  01/24/18 112/81   Wt Readings from Last 3 Encounters:  01/24/18 178 lb (80.7 kg)  11/23/17 178 lb (80.7 kg)  10/05/17 175 lb (79.4 kg)   Body mass index is 35.95 kg/m.   Physical Exam Constitutional:      General: She is not in acute distress.    Appearance: Normal appearance.  HENT:     Head: Normocephalic and atraumatic.  Eyes:     General: No scleral icterus.       Right eye: No discharge.        Left eye: No discharge.     Conjunctiva/sclera: Conjunctivae normal.     Pupils: Pupils are equal, round, and reactive to light.  Cardiovascular:     Rate and Rhythm: Normal rate and regular rhythm.     Comments: Increase vascularity neck and chest from SVC syndrome  Pulmonary:     Effort: Pulmonary effort is normal.     Breath sounds: Normal breath sounds.  Skin:    General: Skin is warm and dry.  Neurological:     Mental Status: She is alert.            Assessment & Plan:    See Problem List for  Assessment and Plan of chronic medical problems.

## 2018-03-09 ENCOUNTER — Encounter: Payer: Self-pay | Admitting: Internal Medicine

## 2018-03-09 ENCOUNTER — Ambulatory Visit (INDEPENDENT_AMBULATORY_CARE_PROVIDER_SITE_OTHER): Payer: Medicare Other | Admitting: Internal Medicine

## 2018-03-09 VITALS — BP 126/64 | Temp 98.1°F | Resp 16 | Ht 59.0 in

## 2018-03-09 DIAGNOSIS — H5711 Ocular pain, right eye: Secondary | ICD-10-CM | POA: Diagnosis not present

## 2018-03-09 NOTE — Telephone Encounter (Signed)
ATC [facility] (recording stated they were having technical difficulties) and was on hold for over 6 min  WCB

## 2018-03-09 NOTE — Assessment & Plan Note (Signed)
Appears to have intermittent right eye discomfort - presses eye and applies pressure Also with light sensitivity at times - two are not related as far as she knows No obvious pattern or cause ? Right eye protruding - not here on exam No redness or discharge ? Related to increased pressure in eye from SVC synd from goiter, ? Inflammation in eye or retina problem If symptoms continue need to see eye doctor reviewed most recent CTs of neck - no concerning R ICA stenosis

## 2018-03-09 NOTE — Patient Instructions (Signed)
If her symptoms do not improve or worsen she should see her eye doctor.

## 2018-03-10 ENCOUNTER — Other Ambulatory Visit: Payer: Self-pay | Admitting: Internal Medicine

## 2018-03-11 ENCOUNTER — Inpatient Hospital Stay: Payer: Medicare Other | Attending: Hematology and Oncology

## 2018-03-11 ENCOUNTER — Telehealth: Payer: Self-pay | Admitting: Hematology and Oncology

## 2018-03-11 ENCOUNTER — Ambulatory Visit (INDEPENDENT_AMBULATORY_CARE_PROVIDER_SITE_OTHER): Payer: Medicare Other | Admitting: General Practice

## 2018-03-11 ENCOUNTER — Encounter: Payer: Self-pay | Admitting: Hematology and Oncology

## 2018-03-11 ENCOUNTER — Inpatient Hospital Stay (HOSPITAL_BASED_OUTPATIENT_CLINIC_OR_DEPARTMENT_OTHER): Payer: Medicare Other | Admitting: Hematology and Oncology

## 2018-03-11 DIAGNOSIS — E042 Nontoxic multinodular goiter: Secondary | ICD-10-CM

## 2018-03-11 DIAGNOSIS — Z86711 Personal history of pulmonary embolism: Secondary | ICD-10-CM | POA: Diagnosis not present

## 2018-03-11 DIAGNOSIS — Z7901 Long term (current) use of anticoagulants: Secondary | ICD-10-CM | POA: Diagnosis not present

## 2018-03-11 DIAGNOSIS — D473 Essential (hemorrhagic) thrombocythemia: Secondary | ICD-10-CM

## 2018-03-11 DIAGNOSIS — I824Z3 Acute embolism and thrombosis of unspecified deep veins of distal lower extremity, bilateral: Secondary | ICD-10-CM

## 2018-03-11 LAB — CBC WITH DIFFERENTIAL/PLATELET
Abs Immature Granulocytes: 0.05 10*3/uL (ref 0.00–0.07)
Basophils Absolute: 0.1 10*3/uL (ref 0.0–0.1)
Basophils Relative: 1 %
Eosinophils Absolute: 0.1 10*3/uL (ref 0.0–0.5)
Eosinophils Relative: 2 %
HCT: 42.3 % (ref 36.0–46.0)
Hemoglobin: 13.3 g/dL (ref 12.0–15.0)
Immature Granulocytes: 1 %
Lymphocytes Relative: 14 %
Lymphs Abs: 0.9 10*3/uL (ref 0.7–4.0)
MCH: 38.3 pg — ABNORMAL HIGH (ref 26.0–34.0)
MCHC: 31.4 g/dL (ref 30.0–36.0)
MCV: 121.9 fL — ABNORMAL HIGH (ref 80.0–100.0)
Monocytes Absolute: 0.3 10*3/uL (ref 0.1–1.0)
Monocytes Relative: 5 %
Neutro Abs: 5.4 10*3/uL (ref 1.7–7.7)
Neutrophils Relative %: 77 %
Platelets: 454 10*3/uL — ABNORMAL HIGH (ref 150–400)
RBC: 3.47 MIL/uL — ABNORMAL LOW (ref 3.87–5.11)
RDW: 15.1 % (ref 11.5–15.5)
WBC: 6.8 10*3/uL (ref 4.0–10.5)
nRBC: 0 % (ref 0.0–0.2)

## 2018-03-11 LAB — PROTIME-INR
INR: 2.4 — ABNORMAL HIGH (ref 0.8–1.2)
Prothrombin Time: 25.9 seconds — ABNORMAL HIGH (ref 11.4–15.2)

## 2018-03-11 NOTE — Assessment & Plan Note (Signed)
She had stable CBC in the last 3 months For now, she will take 1000 mg 3 times a week and 500 mg the rest of the week With stability of her platelet count control, I plan to recheck her lab work again in 3 months

## 2018-03-11 NOTE — Telephone Encounter (Signed)
Gave avs and calendar ° °

## 2018-03-11 NOTE — Progress Notes (Signed)
[place] OFFICE PROGRESS NOTE  Patient Care Team: [name], MD as PCP - General (Internal Medicine) Eunice Blase, MD as Consulting Physician (Family Medicine) Renato Shin, MD as Consulting Physician (Endocrinology) Netta Cedars, MD as Consulting Physician (Orthopedic Surgery) Lyndal Pulley, DO (Sports Medicine) Garvin Fila, MD (Neurology)  ASSESSMENT & PLAN:  Essential thrombocytosis [facility]) She had stable CBC in the last 3 months For now, she will take 1000 mg 3 times a week and 500 mg the rest of the week With stability of her platelet count control, I plan to recheck her lab work again in 3 months  History of pulmonary embolism Due to her high risk disease, she will continue anticoagulation therapy indefinitely She will continue INR monitoring through her primary care doctor. Today, her INR is stable Her daughter is informed of the excellent test results.  Multinodular goiter She has multi-nodular goiter and has seen a specialist at [facility] for possible embolization procedure to treat the goiter From my standpoint, there is no contraindication for her to proceed   No orders of the defined types were placed in this encounter.   INTERVAL HISTORY: Please see below for problem oriented charting. She returns with her daughter for further follow-up She is doing well No recent infection, fever or chills The patient denies any recent signs or symptoms of bleeding such as spontaneous epistaxis, hematuria or hematochezia. Her daughter informed me that the patient has seen a specialist at [facility] for management of goiter  SUMMARY OF ONCOLOGIC HISTORY:   Essential thrombocytosis ([place])   08/07/2013 Miscellaneous    The patient is noted to have elevated platelet count    03/08/2014 Imaging    Positive for acute PE with CT evidence of right heart strain (RV/LV Ratio = 0.9) consistent with at least submassive (intermediate risk)PE.     03/09/2014 Imaging    US  venous Doppler showed deep vein thrombosis noted in the right distal common femoral vein, femoral vein, and popliteal vein. DVT noted in the left femoral and popliteal veins    03/09/2014 Imaging    Patchy areas of acute left MCA territory infarction. 2. Several punctate foci of acute right MCA territory infarction. Possible trace subarachnoid hemorrhage in the high right frontal lobe. 3. Occluded left ICA and left MCA    03/20/2015 Imaging    Compared to MRI on 03/09/14, there has been expected evolutional change of left MCA infarction. In addition, there may be a few areas of acute-subacute infarcts in the left basal ganglia vs artifact.      07/31/2015 Pathology Results    Peripheral blood is positive for JAK2 mutation    07/31/2015 -  Chemotherapy    She is started on 500 mg daily Hydrea    08/15/2015 Miscellaneous    The dose of hydroxyurea is increased to 1000 mg daily    09/26/2015 Miscellaneous    Dose of Hydrea to 500 mg daily except on Saturdays and Sundays she takes 1000 mg.    10/25/2015 Adverse Reaction    Dose of Hydrea is reduced to 500 mg daily     10 /04/2016 Miscellaneous    From 2017 to 2018, the dose of Hydrea is further adjusted.     REVIEW OF SYSTEMS:   Constitutional: Denies fevers, chills or abnormal weight loss Eyes: Denies blurriness of vision Ears, nose, mouth, throat, and face: Denies mucositis or sore throat Respiratory: Denies cough, dyspnea or wheezes Cardiovascular: Denies palpitation, chest discomfort or lower  extremity swelling Gastrointestinal:  Denies nausea, heartburn or change in bowel habits Skin: Denies abnormal skin rashes Lymphatics: Denies new lymphadenopathy or easy bruising Neurological:Denies numbness, tingling or new weaknesses Behavioral/Psych: Mood is stable, no new changes  All other systems were reviewed with the patient and are negative.  I have reviewed the past medical history, past surgical history, social history and family  history with the patient and they are unchanged from previous note.  ALLERGIES:  is allergic to valtrex [valacyclovir hcl].  MEDICATIONS:  Current Outpatient Medications  Medication Sig Dispense Refill  . acetaminophen (TYLENOL) 160 MG/5ML liquid Take 480 mg by mouth every 4 (four) hours as needed for fever or pain.    . calamine lotion Apply 1 application as needed topically for itching. 120 mL 0  . chlorhexidine (PERIDEX) 0.12 % solution RINSE TWICE A DAY FOR 1 MINUTE  5  . ciclopirox (PENLAC) 8 % solution Apply topically at bedtime. Apply over nail & surrounding skin. Apply Qd over previous coat. After 7 days, remove w alcohol, continue cycle. 6.6 mL 5  . diclofenac sodium (VOLTAREN) 1 % GEL APPLY (2GMS) TOPICALLY THREE TIMES DAILY. (Patient taking differently: Apply 2 g topically 3 (three) times daily as needed. ) 300 g 5  . hydroxyurea (HYDREA) 500 MG capsule Take 1 tablet (500 mg) daily except 2 tablets ( 1000 mg ) on Wednesday, Saturday and Sunday 120 capsule 3  . ipratropium-albuterol (DUONEB) 0.5-2.5 (3) MG/3ML SOLN Take 3 mLs by nebulization every 6 (six) hours as needed. (Patient taking differently: Take 3 mLs by nebulization every 6 (six) hours as needed (shortness of breath). ) 360 mL 0  . lidocaine (LIDODERM) 5 % Place 1 patch daily onto the skin. Remove & Discard patch within 12 hours or as directed by MD (Patient taking differently: Place 1 patch onto the skin daily as needed (pain). Remove & Discard patch within 12 hours or as directed by MD) 15 patch 0  . methimazole (TAPAZOLE) 5 MG tablet Take 1 tablet (5 mg total) by mouth 2 (two) times a week. TAKE 1 TABLET BY MOUTH TWICE WEEKLY 12 tablet 1  . NONFORMULARY OR COMPOUNDED ITEM Topical cream with equal parts diclofenac, gabapentin, lidocaine, menthol  Disp 100 gm 1 each 5  . Oral Hygiene Products (Q-CARE COVERD [place]ANK[place]ER/SUCTION) MISC Use as directed 10 each 5  . OXYGEN 2lpm 24/7  DME- [facility]    . polyethylene glycol (MIRALAX /  GLYCOLAX) packet TAKE 17G BY MOUTH TWICE A DAY (Patient taking differently: Take 17 g by mouth 2 (two) times daily as needed for mild constipation. ) 14 packet 1  . pregabalin (LYRICA) 25 MG capsule Take 1 capsule (25 mg total) by mouth 3 (three) times daily. 90 capsule 2  . pregabalin (LYRICA) 50 MG capsule Take 1 capsule (50 mg total) by mouth 3 (three) times daily. 90 capsule 0  . Probiotic Product (ALIGN) 4 MG CAPS Take 4 mg by mouth daily.     . propranolol (INDERAL) 10 MG tablet Take 0.5 tablets (5 mg total) by mouth 2 (two) times daily. 60 tablet 5  . PROTONIX 40 MG PACK TAKE 20 MLS (40 MG TOTAL) BY MOUTH DAILY. 40 mL 5  . terconazole (TERAZOL 7) 0.4 % vaginal cream Place 1 applicator vaginally at bedtime. (Patient taking differently: Place 1 applicator vaginally at bedtime as needed (irritation). ) 90 g 3  . traMADol (ULTRAM) 50 MG tablet TAKE 2 TABLETS BY MOUTH 3 TIMES A DAY AS NEEDED  180 tablet 1  . traZODone (DESYREL) 50 MG tablet TAKE 1 TABLET (50 MG TOTAL) BY MOUTH AT BEDTIME AS NEEDED. FOR SLEEP 30 tablet 5  . warfarin (COUMADIN) 4 MG tablet Take 1 tablet daily except 1/2 on Sunday and thursdays or Take as directed by anticoagulation clinic.  90 day 90 tablet 1   No current facility-administered medications for this visit.     PHYSICAL EXAMINATION: ECOG PERFORMANCE STATUS: 2 - Symptomatic, <50% confined to bed  Vitals:   03/11/18 1534  BP: 111/61  Pulse: 81  Resp: 17  Temp: (!) 97.3 F (36.3 C)  SpO2: 98%   There were no vitals filed for this visit.  GENERAL:alert, no distress and comfortable SKIN: Noted dilated veins consistent with previous thrombosis, stable NEURO: alert with dysphasia, she sits on the wheelchair   LABORATORY DATA:  I have reviewed the data as listed    Component Value Date/Time   NA 140 10/05/2017 1431   NA 142 10/06/2016 1352   K 3.8 10/05/2017 1431   K 4.1 10/06/2016 1352   CL 106 10/05/2017 1431   CO2 28 10/05/2017 1431   CO2 27  10/06/2016 1352   GLUCOSE 93 10/05/2017 1431   GLUCOSE 90 10/06/2016 1352   BUN 8 10/05/2017 1431   BUN 12.9 10/06/2016 1352   CREATININE 0.33 (L) 10/05/2017 1431   CREATININE 0.6 10/06/2016 1352   CALCIUM 8.7 (L) 10/05/2017 1431   CALCIUM 9.4 10/06/2016 1352   PROT 5.9 (L) 10/05/2017 1431   ALBUMIN 2.8 (L) 10/05/2017 1431   AST 13 (L) 10/05/2017 1431   ALT 9 10/05/2017 1431   ALKPHOS 65 10/05/2017 1431   BILITOT 0.9 10/05/2017 1431   GFRNONAA >60 10/05/2017 1431   GFRAA >60 10/05/2017 1431    No results found for: SPEP, UPEP  Lab Results  Component Value Date   WBC 6.8 03/11/2018   NEUTROABS 5.4 03/11/2018   HGB 13.3 03/11/2018   HCT 42.3 03/11/2018   MCV 121.9 (H) 03/11/2018   PLT 454 (H) 03/11/2018      Chemistry      Component Value Date/Time   NA 140 10/05/2017 1431   NA 142 10/06/2016 1352   K 3.8 10/05/2017 1431   K 4.1 10/06/2016 1352   CL 106 10/05/2017 1431   CO2 28 10/05/2017 1431   CO2 27 10/06/2016 1352   BUN 8 10/05/2017 1431   BUN 12.9 10/06/2016 1352   CREATININE 0.33 (L) 10/05/2017 1431   CREATININE 0.6 10/06/2016 1352      Component Value Date/Time   CALCIUM 8.7 (L) 10/05/2017 1431   CALCIUM 9.4 10/06/2016 1352   ALKPHOS 65 10/05/2017 1431   AST 13 (L) 10/05/2017 1431   ALT 9 10/05/2017 1431   BILITOT 0.9 10/05/2017 1431     All questions were answered. The patient knows to call the clinic with any problems, questions or concerns. No barriers to learning was detected.  I spent 15 minutes counseling the patient face to face. The total time spent in the appointment was 20 minutes and more than 50% was on counseling and review of test results  Heath Lark, MD 03/11/2018 3:51 PM

## 2018-03-11 NOTE — Assessment & Plan Note (Signed)
Due to her high risk disease, she will continue anticoagulation therapy indefinitely She will continue INR monitoring through her primary care doctor. Today, her INR is stable Her daughter is informed of the excellent test results.

## 2018-03-11 NOTE — Assessment & Plan Note (Signed)
She has multi-nodular goiter and has seen a specialist at [facility] for possible embolization procedure to treat the goiter From my standpoint, there is no contraindication for her to proceed

## 2018-03-11 NOTE — Patient Instructions (Addendum)
Pre visit review using our clinic review tool, if applicable. No additional management support is needed unless otherwise documented below in the visit note.  Continue to take 1 tablet every day except 1/2 tablet on Sundays and Thursdays.  Labs drawn at [facility].  Patient's daughter called to report INR.  Dosing instructions given to daughter and she verbalized understanding.

## 2018-03-11 NOTE — Telephone Encounter (Signed)
[place].  [place], [phone], recording stated wait time less then 3 minutes, on hold 104min.  Called [phone], ext [phone], recording stated experiencing technical difficulty, extended wait time.  Will try at a later time.

## 2018-03-12 NOTE — Progress Notes (Signed)
Agree with management.  Josey Dettmann J Danial Hlavac, MD  

## 2018-03-14 ENCOUNTER — Telehealth: Payer: Self-pay | Admitting: Internal Medicine

## 2018-03-14 ENCOUNTER — Other Ambulatory Visit: Payer: Self-pay | Admitting: Internal Medicine

## 2018-03-14 NOTE — Telephone Encounter (Signed)
Last refill was 02/08/18 Last OV was 03/09/18 Next OV 05/25/18

## 2018-03-14 NOTE — Telephone Encounter (Signed)
I called [place] and the wait time to speak to someone was 48 mins. I tried dialing the extension and it wont let me through to [place]. Will try again later

## 2018-03-14 NOTE — Telephone Encounter (Signed)
Patient's daughter [place] ph# [phone] called re: patient is scheduled for a Goiter Embolization in April. Daughter would like to discuss procedure before it is done and to find out if Dr. Cruzita Lederer would be doing follow ups after the procedure (twice per month for 3 months). Please call patient's daughter to advise at the above ph#.

## 2018-03-15 NOTE — Telephone Encounter (Signed)
Notified patient's daughter and she expressed understanding but she also would like to know if they can come in and see Dr. Cruzita Lederer to discuss if this procedure is the best course of treatment, if you are familiar with it and also would like to know if we can manage the follow up labs because we are closer.

## 2018-03-15 NOTE — Telephone Encounter (Signed)
I can definitely follow-up with patient after the postop follow-up has been completed.  I am not familiar with the procedure, and that is why I was suggesting for her to be seen in the same place for now.

## 2018-03-15 NOTE — Telephone Encounter (Signed)
No, the follow-up will be through the physician performing the procedure as they need to know what to follow-up for. I would also definitely not be able to see her twice a month for 3 months.

## 2018-03-15 NOTE — Telephone Encounter (Signed)
Called [facility]. Phone stated that they are having technical difficulties and the wait time would be 32 minutes. Will call back.

## 2018-03-16 NOTE — Telephone Encounter (Signed)
Notified patient's daughter of message from Dr. Cruzita Lederer, patient expressed understanding and agreement. No further questions.

## 2018-03-17 NOTE — Telephone Encounter (Signed)
Attempted to call [facility] but was on hold for 10 minutes. Will try to call back later.

## 2018-03-18 NOTE — Telephone Encounter (Signed)
Left message for Sandy Barrett to call back.

## 2018-03-21 NOTE — Telephone Encounter (Signed)
Called [facility], due to difficulties and high volume calls wait time was 46 minutes. Will call back.

## 2018-03-22 NOTE — Telephone Encounter (Signed)
ATC [facility], was placed on hold over 10 minutes.  Will call back at a later time.

## 2018-03-23 NOTE — Telephone Encounter (Signed)
Tried to call [facility] and get over to the DME department. Pressed 1 for the DME department but the recording kept replaying. '  Will attempt to call back later.

## 2018-03-24 NOTE — Telephone Encounter (Signed)
Called [facility], spoke with Lennette Bihari.  Respiratory diagnosis code given, chronic respiratory failure with hypoxia, J96.11.  Nothing further needed at this time.

## 2018-04-07 ENCOUNTER — Other Ambulatory Visit: Payer: Self-pay | Admitting: Internal Medicine

## 2018-04-08 NOTE — Telephone Encounter (Signed)
Last refill was 03/10/18 for tramadol and 03/14/18 for lyrica.  Last OV 03/09/18 Next OV 05/25/18

## 2018-04-19 ENCOUNTER — Other Ambulatory Visit: Payer: Self-pay | Admitting: Internal Medicine

## 2018-04-19 ENCOUNTER — Ambulatory Visit: Payer: Medicare Other | Admitting: Podiatry

## 2018-04-25 ENCOUNTER — Telehealth: Payer: Self-pay | Admitting: Internal Medicine

## 2018-04-25 MED ORDER — AMOXICILLIN-POT CLAVULANATE 875-125 MG PO TABS: 1.0000 | ORAL_TABLET | Freq: Two times a day (BID) | ORAL | 0 refills | Status: DC

## 2018-04-25 NOTE — Telephone Encounter (Signed)
Daughter declines virtual visit at this time.

## 2018-04-25 NOTE — Telephone Encounter (Signed)
Medication has been sent. Daughter is aware

## 2018-04-25 NOTE — Telephone Encounter (Signed)
Ok to try augmentin which has worked in the past -- rx pending.

## 2018-04-25 NOTE — Telephone Encounter (Signed)
Copied from [place] [id number]. Topic: Quick Communication - See Telephone Encounter >> Apr 25, 2018  1:58 PM Valla Leaver wrote: CRM for notification. See Telephone encounter for: 04/25/18. Daughter, [name], asking Dr. Quay Burow for antibiotics because she believe the patient has a UTI and patient has also been talking to herself more than usual lately. Daughter declined e-visit for now and requested a response from Dr. Quay Burow.

## 2018-04-26 ENCOUNTER — Ambulatory Visit: Payer: Medicare Other | Admitting: Physical Medicine & Rehabilitation

## 2018-05-02 ENCOUNTER — Other Ambulatory Visit: Payer: Self-pay | Admitting: Internal Medicine

## 2018-05-10 ENCOUNTER — Telehealth: Payer: Self-pay

## 2018-05-10 NOTE — Telephone Encounter (Signed)
Sandy Barrett called in regards to [name], was wanting to know if there is any way to adjust her medication in order to help with her non moving leg. States pain has been increasing.

## 2018-05-11 ENCOUNTER — Other Ambulatory Visit: Payer: Self-pay | Admitting: Internal Medicine

## 2018-05-11 NOTE — Telephone Encounter (Signed)
Dr Quay Burow is prescribing both tramadol and Lyrica.Pt should address questions to Dr Quay Burow We can do repeat knee injections next visit

## 2018-05-11 NOTE — Telephone Encounter (Signed)
Notified Sandy Barrett.

## 2018-05-12 ENCOUNTER — Other Ambulatory Visit: Payer: Self-pay | Admitting: Internal Medicine

## 2018-05-12 NOTE — Telephone Encounter (Signed)
Last OV 07/19/17 Next OV 05/25/18  [place] Controlled Substance Database checked. Last filled on 04/13/2018

## 2018-05-18 ENCOUNTER — Ambulatory Visit: Payer: Self-pay

## 2018-05-18 MED ORDER — AMOXICILLIN-POT CLAVULANATE 875-125 MG PO TABS: 1.0000 | ORAL_TABLET | Freq: Two times a day (BID) | ORAL | 0 refills | Status: DC

## 2018-05-18 NOTE — Addendum Note (Signed)
Addended by: [name] on: 05/18/2018 03:38 PM   Modules accepted: Orders

## 2018-05-18 NOTE — Telephone Encounter (Signed)
rec'd call from pt's. daughter, [name].  Reported the patient is having recurrence of hallucinations, and change in her speech, that she has noticed with previous UTI's.  Described change in speech as more "mumbling and trailing off of her words."  Reported pt. has been aphasic from hx of stroke, but that her verbal communication has changed from her baseline.  Daughter stated the pt. was on antibiotic about 3 weeks ago for a UTI, and concerned about poss. recurrent UTI symptoms.  Stated there was improvement in her symptoms for short time after the antibiotic; noted decreased hallucinations, and improvement in the smell of urine.  Stated that her mother does not exhibit any signs of pain, or verbalize any pain.  Wears incontinent briefs; does not have any signs of strong odor to urine, or discoloration of urine. Temp. 97.6 Ax.   Daughter is inquiring about pt. being given another round of antibiotic, as she is concerned that a UTI is the underlying cause of change in her behavior.  Advised will forward message to Dr. Quay Burow, for further recommendations.  Agreed with plan.              Answer Assessment - Initial Assessment Questions 1. SYMPTOM: "What's the main symptom you're concerned about?" (e.g., frequency, incontinence)     Hallucinations, speech changes-more mumbling and trailing off of her speech, resistant to care 2. ONSET: "When did the symptoms start?"     Just this AM 3. PAIN: "Is there any pain?" If so, ask: "How bad is it?" (Scale: 1-10; mild, moderate, severe)     Does not exhibit pain or verbalize pain 4. CAUSE: "What do you think is causing the symptoms?"     UTI; daughter stated the pt. Had a UTI about 3 weeks ago, and voiced concern she may have a recurrent UTI  5. OTHER SYMPTOMS: "Do you have any other symptoms?" (e.g., fever, flank pain, blood in urine, pain with urination)    Per daughter; does not complain of pain or show any signs of pain ; temp. 97.6 Axillary, pt. Wears  incontinent briefs; no signs of odor to urine; no discoloration; 6. PREGNANCY: "Is there any chance you are pregnant?" "When was your last menstrual period?"     N/a  Protocols used: URINARY [facility]

## 2018-05-18 NOTE — Telephone Encounter (Signed)
Antibiotic sent in to pharmacy.  May need a virtual visit if symptoms do not improve

## 2018-05-18 NOTE — Telephone Encounter (Signed)
Daughter is aware of response and expressed understanding.

## 2018-05-23 ENCOUNTER — Other Ambulatory Visit: Payer: Self-pay | Admitting: Endocrinology

## 2018-05-23 NOTE — Telephone Encounter (Signed)
Please advise if refill is appropriate 

## 2018-05-24 ENCOUNTER — Encounter: Payer: Self-pay | Admitting: Physical Medicine & Rehabilitation

## 2018-05-24 ENCOUNTER — Other Ambulatory Visit: Payer: Self-pay

## 2018-05-24 ENCOUNTER — Encounter: Payer: Medicare Other | Attending: Physical Medicine & Rehabilitation | Admitting: Physical Medicine & Rehabilitation

## 2018-05-24 VITALS — BP 115/79 | Temp 98.1°F | Ht 60.0 in | Wt 175.0 lb

## 2018-05-24 DIAGNOSIS — G8110 Spastic hemiplegia affecting unspecified side: Secondary | ICD-10-CM | POA: Diagnosis not present

## 2018-05-24 DIAGNOSIS — G479 Sleep disorder, unspecified: Secondary | ICD-10-CM | POA: Insufficient documentation

## 2018-05-24 DIAGNOSIS — I890 Lymphedema, not elsewhere classified: Secondary | ICD-10-CM

## 2018-05-24 NOTE — Progress Notes (Signed)
Dysport Injection for spasticity using needle EMG guidance  Dilution: 200 Units/ml Indication: Severe spasticity which interferes with ADL,mobility and/or  hygiene and is unresponsive to medication management and other conservative care Informed consent was obtained after describing risks and benefits of the procedure with the patient. This includes bleeding, bruising, infection, excessive weakness, or medication side effects. A REMS form is on file and signed. Needle:  needle electrode Number of units per muscle PQ100 FDP to index  100- difficult to isolate Pectoralis 200 FPL 100U All injections done after neg drawback for blood and appropriate EMG activity.  Discussed with daughter that muscle activity is diminishing on EMG.  Therefore we can reduce dose of Dysport to 300 units inject pectoralis 200 units, and no injection to the forearm Pt tolerated procedure well.    Complaining of increasing right medial knee pain, Last injection >65mo ago will schedule repeat with Zilretta given the short duration of effect from [place].  Given age and history of stroke poor candidate for nonsteroidal anti-inflammatories   In addition patient having pain while she is trying to sleep positioning is difficult, tight and painful Achilles will order PR AFO  In addition daughter is concerned about increasing right upper extremity edema, will make referral to lymphedema therapist at [facility] outpatient therapy

## 2018-05-24 NOTE — Patient Instructions (Signed)

## 2018-05-24 NOTE — Progress Notes (Deleted)
Subjective:    Patient ID: Sandy Barrett, female    DOB: 1932/08/25, 85 y.o.   MRN: [id number]  HPI The patient is here for follow up.  Sores on bottom:  Tough to heal.  Green color in brief when she pulls it off.  She is using neosporin.  Painful.    Hypertension: She is taking her medication daily. She is compliant with a low sodium diet.  She denies chest pain, palpitations, edema, shortness of breath and regular headaches.     H/o stroke with residual aphasia, spastic hemiparesis of right side:  She is taking warfarin daily.  She is getting botox injections for the spasticity.   Nocturnal hypoxia:  She uses oxygen at night.   Thrombocytosis:  She follows with oncology.   Goiter, hyperthyroidism:  She follows with Dr Cruzita Lederer.  She had the RAI treatment at [facility] cancelled due to COVID-19.   Bilateral LE edema:    Recurrent UTI:  Migraine headaches:  She is on propranolol daily for prevention  Neuropathy:  She takes the lyrica twice daily.    Sleep difficulties:  She takes trazodone nightly.    Right leg not comfortable when in bed.  Saw dr Read Drivers -- will try a boot.      Medications and allergies reviewed with patient and updated if appropriate.  Patient Active Problem List   Diagnosis Date Noted   Sleep difficulties 05/24/2018   Eye discomfort, right 03/09/2018   Chronic pain of left knee 01/24/2018   Rash and nonspecific skin eruption 11/24/2017   Right shoulder pain 11/24/2017   Hallucinations 11/24/2017   Adhesive capsulitis of right shoulder 11/23/2017   Bilateral leg edema 05/24/2017   Cholecystitis 02/23/2017   Chronic respiratory failure with hypoxia ([place]) 02/23/2017   Neuralgia 01/09/2017   Venous congestion 10/08/2016   Lymphedema of breast 09/11/2016   Long term (current) use of anticoagulants [Z79.01] 09/10/2016   Left hip pain 08/09/2016   Left leg swelling 05/18/2016   Aspiration pneumonia ([place]) 04/20/2016   Hemidiaphragm  paralysis    Wheelchair bound 02/03/2016   Discoloration of skin 09/26/2015   Essential thrombocytosis ([place]) 07/31/2015   SVC (superior vena cava obstruction), chronic 07/20/2015   Dysphagia 07/20/2015   Constipation 07/20/2015   Thrombophlebitis of breast, right 06/18/2015   Tracheal deviation 06/06/2015   Sensorineural hearing loss, bilateral, moderate-moderately severe 04/30/2015   Abdominal wall lump 03/09/2015   Frequent UTI 02/06/2015   Primary osteoarthritis involving multiple joints 07/26/2014   Pernicious anemia 07/26/2014   Encounter for therapeutic drug monitoring 07/17/2014   Spastic hemiparesis affecting dominant side ([place]), right 05/31/2014   DVT of lower extremity, bilateral ([place]) 03/14/2014   Global aphasia 03/14/2014   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 03/14/2014   Aphasia S/P CVA 03/13/2014   Cerebral infarction due to embolism of left middle cerebral artery (HCC)    Stroke, embolic ([place]) 71/69/6789   History of pulmonary embolism 03/08/2014   Primary localized osteoarthrosis, lower leg 03/06/2013   IBS (irritable bowel syndrome)    Multinodular goiter 01/13/2011   Hyperthyroidism 11/11/2009   OBESITY 04/21/2008   Osteoarthritis 04/21/2008   Migraine headache 04/20/2008   Essential hypertension 04/20/2008    Current Outpatient Medications on File Prior to Visit  Medication Sig Dispense Refill   acetaminophen (TYLENOL) 160 MG/5ML liquid Take 480 mg by mouth every 4 (four) hours as needed for fever or pain.     calamine lotion Apply 1 application as needed topically for itching. 120 mL  0   chlorhexidine (PERIDEX) 0.12 % solution RINSE TWICE A DAY FOR 1 MINUTE  5   ciclopirox (PENLAC) 8 % solution Apply topically at bedtime. Apply over nail & surrounding skin. Apply Qd over previous coat. After 7 days, remove w alcohol, continue cycle. 6.6 mL 5   diclofenac sodium (VOLTAREN) 1 % GEL Apply 2 g topically 3 (three) times daily as needed. 300 g  0   hydroxyurea (HYDREA) 500 MG capsule Take 1 tablet (500 mg) daily except 2 tablets ( 1000 mg ) on Wednesday, Saturday and Sunday 120 capsule 3   ipratropium-albuterol (DUONEB) 0.5-2.5 (3) MG/3ML SOLN Take 3 mLs by nebulization every 6 (six) hours as needed. (Patient taking differently: Take 3 mLs by nebulization every 6 (six) hours as needed (shortness of breath). ) 360 mL 0   lidocaine (LIDODERM) 5 % Place 1 patch daily onto the skin. Remove & Discard patch within 12 hours or as directed by MD (Patient taking differently: Place 1 patch onto the skin daily as needed (pain). Remove & Discard patch within 12 hours or as directed by MD) 15 patch 0   methimazole (TAPAZOLE) 5 MG tablet TAKE 1 TABLET 2 TIMES A WEEK 8 tablet 4   NONFORMULARY OR COMPOUNDED ITEM Topical cream with equal parts diclofenac, gabapentin, lidocaine, menthol  Disp 100 gm 1 each 5   Oral Hygiene Products (Q-CARE COVERD YANKAUER/SUCTION) MISC Use as directed 10 each 5   OXYGEN 2lpm 24/7  DME- [facility]     polyethylene glycol (MIRALAX / GLYCOLAX) packet TAKE 17G BY MOUTH TWICE A DAY (Patient taking differently: Take 17 g by mouth 2 (two) times daily as needed for mild constipation. ) 14 packet 1   pregabalin (LYRICA) 25 MG capsule Take 1 capsule (25 mg total) by mouth 3 (three) times daily. 90 capsule 2   pregabalin (LYRICA) 50 MG capsule TAKE 1 CAPSULE (50 MG TOTAL) BY MOUTH 3 (THREE) TIMES DAILY. 90 capsule 0   Probiotic Product (ALIGN) 4 MG CAPS Take 4 mg by mouth daily.      propranolol (INDERAL) 10 MG tablet Take 0.5 tablets (5 mg total) by mouth 2 (two) times daily. 60 tablet 5   PROTONIX 40 MG PACK TAKE 20 MLS (40 MG TOTAL) BY MOUTH DAILY. 30 each 3   terconazole (TERAZOL 7) 0.4 % vaginal cream Place 1 applicator vaginally at bedtime. (Patient taking differently: Place 1 applicator vaginally at bedtime as needed (irritation). ) 90 g 3   traMADol (ULTRAM) 50 MG tablet TAKE 2 TABLETS BY MOUTH 3 TIMES A DAY AS NEEDED  180 tablet 1   traZODone (DESYREL) 50 MG tablet TAKE 1 TABLET (50 MG TOTAL) BY MOUTH AT BEDTIME AS NEEDED. FOR SLEEP 90 tablet 1   warfarin (COUMADIN) 4 MG tablet Take 1 tablet daily except 1/2 on Sunday and thursdays or Take as directed by anticoagulation clinic.  90 day 90 tablet 1   No current facility-administered medications on file prior to visit.     Past Medical History:  Diagnosis Date   ARTHRITIS    Arthritis    Diverticulosis    Essential thrombocytosis ([place])    HYPERTENSION    HYPERTHYROIDISM    Hyperthyroidism    s/p I-131 ablation 03/2011 of multinod goiter   INSOMNIA    MIGRAINE HEADACHE    OBESITY    Posttraumatic stress disorder    Pulmonary embolism ([place]) 03/2014   with DVT   Stroke ([place]) 03/2014   dysarthria  Past Surgical History:  Procedure Laterality Date   ABDOMINAL HYSTERECTOMY  1976   BREAST SURGERY     biopsy   RADIOLOGY WITH ANESTHESIA Left 03/08/2014   Procedure: RADIOLOGY WITH ANESTHESIA;  Surgeon: Rob Hickman, MD;  Location: [facility];  Service: Radiology;  Laterality: Left;   TONSILLECTOMY     TOTAL HIP ARTHROPLASTY  1998    right    Social History   Socioeconomic History   Marital status: Widowed    Spouse name: Not on file   Number of children: 2   Years of education: 14   Highest education level: Not on file  Occupational History   Occupation: retired    Comment: school Proofreader strain: Not on file   Food insecurity:    Worry: Not on file    Inability: Not on file   Transportation needs:    Medical: Not on file    Non-medical: Not on file  Tobacco Use   Smoking status: Never Smoker   Smokeless tobacco: Never Used  Substance and Sexual Activity   Alcohol use: No    Alcohol/week: 0.0 standard drinks   Drug use: No   Sexual activity: Never  Lifestyle   Physical activity:    Days per week: Not on file    Minutes per session: Not on file   Stress:  Not on file  Relationships   Social connections:    Talks on phone: Not on file    Gets together: Not on file    Attends religious service: Not on file    Active member of club or organization: Not on file    Attends meetings of clubs or organizations: Not on file    Relationship status: Not on file  Other Topics Concern   Not on file  Social History Narrative   Widowed, lived alone prior to CVA 03/2014   SNF at [facility], then home 07/2014 with dtr   Right handed   Caffeine use- occasionally drinks tea    Family History  Problem Relation Age of Onset   Asthma Mother    Asthma Father    Prostate cancer Father    Stroke Brother    Breast cancer Sister    Stomach cancer Sister    Thyroid disease Neg Hx     Review of Systems     Objective:  There were no vitals filed for this visit. BP Readings from Last 3 Encounters:  05/24/18 115/79  03/11/18 111/61  03/09/18 126/64   Wt Readings from Last 3 Encounters:  05/24/18 175 lb (79.4 kg)  01/24/18 178 lb (80.7 kg)  11/23/17 178 lb (80.7 kg)   There is no height or weight on file to calculate BMI.   Physical Exam    Constitutional: Appears well-developed and well-nourished. No distress.  HENT:  Head: Normocephalic and atraumatic.  Neck: Neck supple. No tracheal deviation present. No thyromegaly present.  No cervical lymphadenopathy Cardiovascular: Normal rate, regular rhythm and normal heart sounds.   No murmur heard. No carotid bruit .  No edema Pulmonary/Chest: Effort normal and breath sounds normal. No respiratory distress. No has no wheezes. No rales.  Skin: Skin is warm and dry. Not diaphoretic.  Psychiatric: Normal mood and affect. Behavior is normal.      Assessment & Plan:    See Problem List for Assessment and Plan of chronic medical problems.

## 2018-05-25 ENCOUNTER — Ambulatory Visit (INDEPENDENT_AMBULATORY_CARE_PROVIDER_SITE_OTHER): Payer: Medicare Other | Admitting: Internal Medicine

## 2018-05-25 ENCOUNTER — Encounter: Payer: Self-pay | Admitting: Internal Medicine

## 2018-05-25 DIAGNOSIS — M792 Neuralgia and neuritis, unspecified: Secondary | ICD-10-CM

## 2018-05-25 DIAGNOSIS — R600 Localized edema: Secondary | ICD-10-CM | POA: Diagnosis not present

## 2018-05-25 DIAGNOSIS — G43809 Other migraine, not intractable, without status migrainosus: Secondary | ICD-10-CM | POA: Diagnosis not present

## 2018-05-25 DIAGNOSIS — I824Z3 Acute embolism and thrombosis of unspecified deep veins of distal lower extremity, bilateral: Secondary | ICD-10-CM | POA: Diagnosis not present

## 2018-05-25 DIAGNOSIS — N390 Urinary tract infection, site not specified: Secondary | ICD-10-CM | POA: Diagnosis not present

## 2018-05-25 DIAGNOSIS — L8990 Pressure ulcer of unspecified site, unspecified stage: Secondary | ICD-10-CM

## 2018-05-25 DIAGNOSIS — E0590 Thyrotoxicosis, unspecified without thyrotoxic crisis or storm: Secondary | ICD-10-CM | POA: Diagnosis not present

## 2018-05-25 DIAGNOSIS — G479 Sleep disorder, unspecified: Secondary | ICD-10-CM | POA: Diagnosis not present

## 2018-05-25 MED ORDER — MUPIROCIN CALCIUM 2 % EX CREA: 1.0000 "application " | TOPICAL_CREAM | Freq: Two times a day (BID) | CUTANEOUS | 0 refills | Status: DC

## 2018-05-25 NOTE — Assessment & Plan Note (Signed)
Related to strokes and history of shingles Taking Lyrica 50 mg 3 times daily-continue Also taking tramadol Tylenol as needed Continue above

## 2018-05-25 NOTE — Assessment & Plan Note (Addendum)
Having pressure sores buttock/sacral area Barrier cream was not effective and she is currently using Neosporin There is a greenish discharge so possible superficial infection Will try Bactroban ointment and then barrier cream on top of that There is no improvement or any worsening her daughter will let me know and I will order a home health nurse to come out and look at it  Patient already has a hospital bed and would benefit from a low air loss mattress overlay-this is required due to her extremely limited mobility due to hemiparesis of her right side, chronic edema and compromised circulatory system.

## 2018-05-25 NOTE — Assessment & Plan Note (Signed)
Follows with Dr. Cruzita Lederer On methimazole Was scheduled for radioactive iodine treatment at [facility], but this was canceled due to the COVID-19 situation-to be rescheduled at some time Daughter states she is eating without difficulty and is breathing normally

## 2018-05-25 NOTE — Progress Notes (Signed)
Virtual Visit via Video Note  I connected with [name] Rasch on 05/25/18 at  3:00 PM EDT by a video enabled telemedicine application and verified that I am speaking with the correct person using two identifiers.   I discussed the limitations of evaluation and management by telemedicine and the availability of in person appointments. The patient expressed understanding and agreed to proceed.  The patient is currently at home and I am in the office.  The patient is aphasic so the daughter provides the history.  No referring provider.    History of Present Illness: This visit is for follow-up of chronic problems.    Pressure ulcers, sores on bottom: Her daughter has been treating ulcers on her bottom for a while and they are not healing.  Recently she noticed green discharge when she pulls off the brief and she was concerned that she needs to do something different.  She was using barrier cream and that did not seem to help so she changed to an antibacterial ointment-Neosporin.  Mother has been in the bed more than usual and if she is not in the bed she is in a wheelchair.  Her daughter has difficulty readjusting her because of her right-sided swelling and if she lays on that side and will become more swollen.  Migraine headaches: She takes propranolol daily for prevention.  This is always worked well for her.  Her blood pressure has been okay even with this medication.  H/o stroke with residual aphasia, spastic hemiparesis of right side:  She is taking warfarin daily.  She is getting botox injections for the spasticity.   Nocturnal hypoxia:  She uses oxygen at night.   Thrombocytosis:  She follows with oncology.   Goiter, hyperthyroidism:  She follows with Dr Cruzita Lederer.  She had the RAI treatment at [facility] cancelled due to COVID-19.   Bilateral LE edema: Continues to have lower extremity edema and lymphedema on right side.  Recurrent UTI: She just finished her Augmentin.  Her daughter states  that her urine does not smell she does not think she has an active infection.  Neuropathy:  She takes the lyrica twice daily.  She takes tramadol daily and Tylenol if needed.  Sleep difficulties:  She takes trazodone nightly.  She has been sleeping well.  Right leg not comfortable when in bed.  Her daughter has been trying to reposition it, but she did see saw dr Read Drivers yesterday and he recommended trying a boot.  Review of systems: Unable to obtain due to patient being aphasic   Social History   Socioeconomic History  . Marital status: Widowed    Spouse name: Not on file  . Number of children: 2  . Years of education: 68  . Highest education level: Not on file  Occupational History  . Occupation: retired    Comment: Restaurant manager, fast food  Social Needs  . Financial resource strain: Not on file  . Food insecurity:    Worry: Not on file    Inability: Not on file  . Transportation needs:    Medical: Not on file    Non-medical: Not on file  Tobacco Use  . Smoking status: Never Smoker  . Smokeless tobacco: Never Used  Substance and Sexual Activity  . Alcohol use: No    Alcohol/week: 0.0 standard drinks  . Drug use: No  . Sexual activity: Never  Lifestyle  . Physical activity:    Days per week: Not on file    Minutes per session: Not  on file  . Stress: Not on file  Relationships  . Social connections:    Talks on phone: Not on file    Gets together: Not on file    Attends religious service: Not on file    Active member of club or organization: Not on file    Attends meetings of clubs or organizations: Not on file    Relationship status: Not on file  Other Topics Concern  . Not on file  Social History Narrative   Widowed, lived alone prior to CVA 03/2014   SNF at [facility], then home 07/2014 with dtr   Right handed   Caffeine use- occasionally drinks tea     Observations/Objective: Appears well in NAD   Assessment and Plan:  See Problem List for  Assessment and Plan of chronic medical problems.   Follow Up Instructions:    I discussed the assessment and treatment plan with the patient. The patient was provided an opportunity to ask questions and all were answered. The patient agreed with the plan and demonstrated an understanding of the instructions.   The patient was advised to call back or seek an in-person evaluation if the symptoms worsen or if the condition fails to improve as anticipated.  Follow-up in 6 months  [name], MD

## 2018-05-25 NOTE — Assessment & Plan Note (Signed)
Stable, controlled Continue trazodone

## 2018-05-25 NOTE — Assessment & Plan Note (Signed)
Chronic DVT Continue warfarin

## 2018-05-25 NOTE — Assessment & Plan Note (Signed)
Chronic, stable 

## 2018-05-25 NOTE — Assessment & Plan Note (Signed)
We will continue propranolol for migraine prevention-she has been on this for years Difficult to know if she truly needs it or not, but her blood pressure does tolerate it so we will continue

## 2018-05-25 NOTE — Assessment & Plan Note (Signed)
Just completed amoxicillin-clavulanate Urine does not have an odor Her daughter will monitor and let me know if she feels there is another infection-we will likely continue to treat empirically since she is incontinent and it is not possible to get a sample

## 2018-06-02 ENCOUNTER — Telehealth: Payer: Self-pay

## 2018-06-02 NOTE — Telephone Encounter (Signed)
-----   Message from Heath Lark, MD sent at 06/02/2018 12:11 PM EDT ----- Regarding: call daughter Pls call daughter No visitors are allowed and she is aphasic with stroke; appointment is not going to be helpful I suggest reschedule her appt to delay by 1 month until July If she agrees, please send new scheduling msg

## 2018-06-02 NOTE — Telephone Encounter (Signed)
Called and left a message asking to call the office. 

## 2018-06-02 NOTE — Telephone Encounter (Signed)
Called and left below message. Ask her to call the office back. 

## 2018-06-03 ENCOUNTER — Telehealth: Payer: Self-pay | Admitting: Internal Medicine

## 2018-06-03 DIAGNOSIS — L89159 Pressure ulcer of sacral region, unspecified stage: Secondary | ICD-10-CM

## 2018-06-03 NOTE — Telephone Encounter (Signed)
No visit needed-we discussed at her last visit.  Referral ordered.

## 2018-06-03 NOTE — Telephone Encounter (Signed)
Copied from [place] [id number]. Topic: General - Other >> Jun 03, 2018 11:09 AM Burchel, Abbi R wrote: Pt's daughter [name]) states that pt's family would like to discuss [place] re: bed sore.  Please call [name] at: [phone]

## 2018-06-03 NOTE — Telephone Encounter (Signed)
LVM letting daughter know. 

## 2018-06-03 NOTE — Telephone Encounter (Signed)
Called and left a message to return call.

## 2018-06-03 NOTE — Telephone Encounter (Signed)
I would assume she needs a visit for this referral. Virtual visit?

## 2018-06-06 ENCOUNTER — Other Ambulatory Visit: Payer: Self-pay | Admitting: Internal Medicine

## 2018-06-07 ENCOUNTER — Ambulatory Visit: Payer: Medicare Other | Admitting: Physical Therapy

## 2018-06-07 ENCOUNTER — Telehealth: Payer: Self-pay

## 2018-06-07 ENCOUNTER — Telehealth: Payer: Self-pay | Admitting: Internal Medicine

## 2018-06-07 MED ORDER — MUPIROCIN CALCIUM 2 % EX CREA: 1.0000 "application " | TOPICAL_CREAM | Freq: Two times a day (BID) | CUTANEOUS | 5 refills | Status: DC

## 2018-06-07 MED ORDER — AMOXICILLIN-POT CLAVULANATE 875-125 MG PO TABS: 1.0000 | ORAL_TABLET | Freq: Two times a day (BID) | ORAL | 0 refills | Status: DC

## 2018-06-07 NOTE — Telephone Encounter (Signed)
Ointment and oral antibiotics sent to pharmacy.  It is difficult to protect the wound completely because of where it is.  And a nonstick bandage that has a seal around the either through nonstick tape or a Band-Aid that does not irritate her skin may help.  Obviously she may need to just continue to change these areas frequently.  Using the skin barrier cream over the antibiotic ointment will help as well.

## 2018-06-07 NOTE — Telephone Encounter (Signed)
Spoke with pt's dtr, [name], to make her aware per [place] no one may come in with pt's at this time and per Dr Alvy Bimler OK to move appt's out to Aug. Teresa agreeable. [name] to see if she can get blood work drawn at pt's PCP. She will call us to let us know best way to get orders to PCP for blood work.

## 2018-06-07 NOTE — Telephone Encounter (Signed)
Daughter would like to do both ointment and oral antibiotic. She also wanted to know what would be the best dressing to use to protect wound from getting feces and urine in it. The order has been sent over to [facility] but I do not think they have reached out to her yet.

## 2018-06-07 NOTE — Telephone Encounter (Signed)
Pt's daughter is aware 

## 2018-06-07 NOTE — Telephone Encounter (Signed)
If the Bactroban (topical antibiotic ointment) was helping we can continue that.  If it looks like it is a deeper infection we can also try an oral antibiotic.  We can actually do both as well.  Has a nurse come out to evaluate the source?

## 2018-06-07 NOTE — Telephone Encounter (Signed)
Copied from [place] [phone]. Topic: General - Other >> Jun 07, 2018  9:15 AM Carolyn Stare wrote:  Pt daughter [name]  call and is asking what she need to do about bed sores cause they are oozing and has a smell, the Rx that was given has run out  Would also like to know what type of covering she can use

## 2018-06-09 ENCOUNTER — Other Ambulatory Visit: Payer: Self-pay | Admitting: Internal Medicine

## 2018-06-09 ENCOUNTER — Encounter: Payer: Self-pay | Admitting: Internal Medicine

## 2018-06-09 DIAGNOSIS — I69351 Hemiplegia and hemiparesis following cerebral infarction affecting right dominant side: Secondary | ICD-10-CM | POA: Diagnosis not present

## 2018-06-09 DIAGNOSIS — M150 Primary generalized (osteo)arthritis: Secondary | ICD-10-CM | POA: Diagnosis not present

## 2018-06-09 DIAGNOSIS — Z4800 Encounter for change or removal of nonsurgical wound dressing: Secondary | ICD-10-CM | POA: Diagnosis not present

## 2018-06-09 DIAGNOSIS — Z86711 Personal history of pulmonary embolism: Secondary | ICD-10-CM | POA: Diagnosis not present

## 2018-06-09 DIAGNOSIS — Z9981 Dependence on supplemental oxygen: Secondary | ICD-10-CM | POA: Diagnosis not present

## 2018-06-09 DIAGNOSIS — G43909 Migraine, unspecified, not intractable, without status migrainosus: Secondary | ICD-10-CM | POA: Diagnosis not present

## 2018-06-09 DIAGNOSIS — G629 Polyneuropathy, unspecified: Secondary | ICD-10-CM | POA: Diagnosis not present

## 2018-06-09 DIAGNOSIS — Z87440 Personal history of urinary (tract) infections: Secondary | ICD-10-CM | POA: Diagnosis not present

## 2018-06-09 DIAGNOSIS — R1310 Dysphagia, unspecified: Secondary | ICD-10-CM | POA: Diagnosis not present

## 2018-06-09 DIAGNOSIS — J9611 Chronic respiratory failure with hypoxia: Secondary | ICD-10-CM | POA: Diagnosis not present

## 2018-06-09 DIAGNOSIS — I69391 Dysphagia following cerebral infarction: Secondary | ICD-10-CM | POA: Diagnosis not present

## 2018-06-09 DIAGNOSIS — E0520 Thyrotoxicosis with toxic multinodular goiter without thyrotoxic crisis or storm: Secondary | ICD-10-CM | POA: Diagnosis not present

## 2018-06-09 DIAGNOSIS — Z7901 Long term (current) use of anticoagulants: Secondary | ICD-10-CM | POA: Diagnosis not present

## 2018-06-09 DIAGNOSIS — Z86718 Personal history of other venous thrombosis and embolism: Secondary | ICD-10-CM | POA: Diagnosis not present

## 2018-06-09 DIAGNOSIS — I69320 Aphasia following cerebral infarction: Secondary | ICD-10-CM | POA: Diagnosis not present

## 2018-06-09 DIAGNOSIS — I69390 Apraxia following cerebral infarction: Secondary | ICD-10-CM | POA: Diagnosis not present

## 2018-06-09 DIAGNOSIS — L89150 Pressure ulcer of sacral region, unstageable: Secondary | ICD-10-CM | POA: Diagnosis not present

## 2018-06-09 DIAGNOSIS — D473 Essential (hemorrhagic) thrombocythemia: Secondary | ICD-10-CM | POA: Diagnosis not present

## 2018-06-09 DIAGNOSIS — Z7401 Bed confinement status: Secondary | ICD-10-CM | POA: Diagnosis not present

## 2018-06-09 DIAGNOSIS — H903 Sensorineural hearing loss, bilateral: Secondary | ICD-10-CM | POA: Diagnosis not present

## 2018-06-09 NOTE — Telephone Encounter (Signed)
[place] Controlled Database Checked Last filled: Lyrica 05/12/18 # 90                  Tramadol 05/07/18 # 180 LOV w/you: 05/07/18 Next appt w/you: None

## 2018-06-10 ENCOUNTER — Telehealth: Payer: Self-pay | Admitting: Internal Medicine

## 2018-06-10 NOTE — Telephone Encounter (Signed)
Copied from [place] [phone]. Topic: Quick Communication - Home Health Verbal Orders >> Jun 10, 2018  8:45 AM Berneta Levins wrote: Caller/Agency: Crystal with [facility] Number: [phone], OK to leave a message Requesting OT/PT/Skilled Nursing/Social Work/Speech Therapy: wound care to right buttock, aquacel AG.  Two other open areas treated with duoderm Frequency: 3x a week for 9 weeks

## 2018-06-10 NOTE — Telephone Encounter (Signed)
Gave ok for orders per Dr. Burns.  

## 2018-06-11 DIAGNOSIS — L89150 Pressure ulcer of sacral region, unstageable: Secondary | ICD-10-CM | POA: Diagnosis not present

## 2018-06-11 DIAGNOSIS — D473 Essential (hemorrhagic) thrombocythemia: Secondary | ICD-10-CM | POA: Diagnosis not present

## 2018-06-11 DIAGNOSIS — Z4800 Encounter for change or removal of nonsurgical wound dressing: Secondary | ICD-10-CM | POA: Diagnosis not present

## 2018-06-11 DIAGNOSIS — G629 Polyneuropathy, unspecified: Secondary | ICD-10-CM | POA: Diagnosis not present

## 2018-06-11 DIAGNOSIS — I69351 Hemiplegia and hemiparesis following cerebral infarction affecting right dominant side: Secondary | ICD-10-CM | POA: Diagnosis not present

## 2018-06-11 DIAGNOSIS — J9611 Chronic respiratory failure with hypoxia: Secondary | ICD-10-CM | POA: Diagnosis not present

## 2018-06-13 ENCOUNTER — Telehealth: Payer: Self-pay

## 2018-06-13 ENCOUNTER — Other Ambulatory Visit: Payer: Medicare Other

## 2018-06-13 ENCOUNTER — Ambulatory Visit: Payer: Medicare Other | Admitting: Hematology and Oncology

## 2018-06-13 DIAGNOSIS — L8990 Pressure ulcer of unspecified site, unspecified stage: Secondary | ICD-10-CM

## 2018-06-13 DIAGNOSIS — D473 Essential (hemorrhagic) thrombocythemia: Secondary | ICD-10-CM | POA: Diagnosis not present

## 2018-06-13 DIAGNOSIS — I69351 Hemiplegia and hemiparesis following cerebral infarction affecting right dominant side: Secondary | ICD-10-CM | POA: Diagnosis not present

## 2018-06-13 DIAGNOSIS — J9611 Chronic respiratory failure with hypoxia: Secondary | ICD-10-CM | POA: Diagnosis not present

## 2018-06-13 DIAGNOSIS — Z4800 Encounter for change or removal of nonsurgical wound dressing: Secondary | ICD-10-CM | POA: Diagnosis not present

## 2018-06-13 DIAGNOSIS — G629 Polyneuropathy, unspecified: Secondary | ICD-10-CM | POA: Diagnosis not present

## 2018-06-13 DIAGNOSIS — L89150 Pressure ulcer of sacral region, unstageable: Secondary | ICD-10-CM | POA: Diagnosis not present

## 2018-06-13 NOTE — Telephone Encounter (Signed)
She has a hospital bed but wants a mattress that helps pressure sores. Not a gel overlay. Alternating pressure mattress?

## 2018-06-13 NOTE — Telephone Encounter (Signed)
ordered

## 2018-06-13 NOTE — Telephone Encounter (Signed)
Not sure what kind of bed.

## 2018-06-13 NOTE — Telephone Encounter (Signed)
hospital bed?

## 2018-06-13 NOTE — Telephone Encounter (Signed)
Copied from [place] [phone]. Topic: General - Inquiry >> Jun 13, 2018  2:25 PM Richardo Priest, NT wrote: Reason for CRM: Patient's daughter is calling in requesting that Dr.Burns place an order for a bed to help with bed sores. Please advise. Call back is [phone].

## 2018-06-14 NOTE — Telephone Encounter (Signed)
LVM for daughter to call back to let me know where she wants script sent.

## 2018-06-15 DIAGNOSIS — J9611 Chronic respiratory failure with hypoxia: Secondary | ICD-10-CM | POA: Diagnosis not present

## 2018-06-15 DIAGNOSIS — Z4800 Encounter for change or removal of nonsurgical wound dressing: Secondary | ICD-10-CM | POA: Diagnosis not present

## 2018-06-15 DIAGNOSIS — I69351 Hemiplegia and hemiparesis following cerebral infarction affecting right dominant side: Secondary | ICD-10-CM | POA: Diagnosis not present

## 2018-06-15 DIAGNOSIS — D473 Essential (hemorrhagic) thrombocythemia: Secondary | ICD-10-CM | POA: Diagnosis not present

## 2018-06-15 DIAGNOSIS — G629 Polyneuropathy, unspecified: Secondary | ICD-10-CM | POA: Diagnosis not present

## 2018-06-15 DIAGNOSIS — L89150 Pressure ulcer of sacral region, unstageable: Secondary | ICD-10-CM | POA: Diagnosis not present

## 2018-06-15 LAB — POCT INR: INR: 4.5 — AB (ref 2.0–3.0)

## 2018-06-15 MED ORDER — MUPIROCIN CALCIUM 2 % EX CREA: 1.0000 "application " | TOPICAL_CREAM | Freq: Two times a day (BID) | CUTANEOUS | 5 refills | Status: DC

## 2018-06-15 NOTE — Telephone Encounter (Signed)
[facility] to [facility].

## 2018-06-17 ENCOUNTER — Telehealth: Payer: Self-pay | Admitting: General Practice

## 2018-06-17 DIAGNOSIS — D473 Essential (hemorrhagic) thrombocythemia: Secondary | ICD-10-CM | POA: Diagnosis not present

## 2018-06-17 DIAGNOSIS — L89150 Pressure ulcer of sacral region, unstageable: Secondary | ICD-10-CM | POA: Diagnosis not present

## 2018-06-17 DIAGNOSIS — J9611 Chronic respiratory failure with hypoxia: Secondary | ICD-10-CM | POA: Diagnosis not present

## 2018-06-17 DIAGNOSIS — G629 Polyneuropathy, unspecified: Secondary | ICD-10-CM | POA: Diagnosis not present

## 2018-06-17 DIAGNOSIS — I69351 Hemiplegia and hemiparesis following cerebral infarction affecting right dominant side: Secondary | ICD-10-CM | POA: Diagnosis not present

## 2018-06-17 DIAGNOSIS — Z4800 Encounter for change or removal of nonsurgical wound dressing: Secondary | ICD-10-CM | POA: Diagnosis not present

## 2018-06-17 NOTE — Telephone Encounter (Signed)
Order faxed to [facility] to check patient's INR on Tuesday, 6/16 and call results to Villa Herb, RN @ [phone].  Faxed to [phone].

## 2018-06-20 ENCOUNTER — Telehealth: Payer: Self-pay

## 2018-06-20 DIAGNOSIS — D473 Essential (hemorrhagic) thrombocythemia: Secondary | ICD-10-CM | POA: Diagnosis not present

## 2018-06-20 DIAGNOSIS — G629 Polyneuropathy, unspecified: Secondary | ICD-10-CM | POA: Diagnosis not present

## 2018-06-20 DIAGNOSIS — L89150 Pressure ulcer of sacral region, unstageable: Secondary | ICD-10-CM | POA: Diagnosis not present

## 2018-06-20 DIAGNOSIS — I69351 Hemiplegia and hemiparesis following cerebral infarction affecting right dominant side: Secondary | ICD-10-CM | POA: Diagnosis not present

## 2018-06-20 DIAGNOSIS — Z4800 Encounter for change or removal of nonsurgical wound dressing: Secondary | ICD-10-CM | POA: Diagnosis not present

## 2018-06-20 DIAGNOSIS — J9611 Chronic respiratory failure with hypoxia: Secondary | ICD-10-CM | POA: Diagnosis not present

## 2018-06-20 NOTE — Telephone Encounter (Signed)
Sandy Barrett called and wants to know if since pt now has home health if they could do her blood work instead of pt waiting until Aug. Please advise. She has [facility] services.

## 2018-06-21 NOTE — Telephone Encounter (Signed)
Can you ask if they can do a CBC and have results faxed to me?

## 2018-06-21 NOTE — Telephone Encounter (Signed)
They requested faxed order to have labs drawn with directions to fax results. Faxed order-confirmation received.  Fax number [phone] Phone [phone]

## 2018-06-22 ENCOUNTER — Telehealth: Payer: Self-pay | Admitting: Internal Medicine

## 2018-06-22 ENCOUNTER — Ambulatory Visit (INDEPENDENT_AMBULATORY_CARE_PROVIDER_SITE_OTHER): Payer: Medicare Other | Admitting: General Practice

## 2018-06-22 DIAGNOSIS — J9611 Chronic respiratory failure with hypoxia: Secondary | ICD-10-CM | POA: Diagnosis not present

## 2018-06-22 DIAGNOSIS — I69351 Hemiplegia and hemiparesis following cerebral infarction affecting right dominant side: Secondary | ICD-10-CM | POA: Diagnosis not present

## 2018-06-22 DIAGNOSIS — Z7901 Long term (current) use of anticoagulants: Secondary | ICD-10-CM | POA: Diagnosis not present

## 2018-06-22 DIAGNOSIS — I63311 Cerebral infarction due to thrombosis of right middle cerebral artery: Secondary | ICD-10-CM | POA: Diagnosis not present

## 2018-06-22 DIAGNOSIS — D473 Essential (hemorrhagic) thrombocythemia: Secondary | ICD-10-CM | POA: Diagnosis not present

## 2018-06-22 DIAGNOSIS — L89150 Pressure ulcer of sacral region, unstageable: Secondary | ICD-10-CM | POA: Diagnosis not present

## 2018-06-22 DIAGNOSIS — Z4800 Encounter for change or removal of nonsurgical wound dressing: Secondary | ICD-10-CM | POA: Diagnosis not present

## 2018-06-22 DIAGNOSIS — I2699 Other pulmonary embolism without acute cor pulmonale: Secondary | ICD-10-CM

## 2018-06-22 DIAGNOSIS — G629 Polyneuropathy, unspecified: Secondary | ICD-10-CM | POA: Diagnosis not present

## 2018-06-22 DIAGNOSIS — I82403 Acute embolism and thrombosis of unspecified deep veins of lower extremity, bilateral: Secondary | ICD-10-CM

## 2018-06-22 LAB — POCT INR: INR: 4.5 — AB (ref 2.0–3.0)

## 2018-06-22 NOTE — Patient Instructions (Signed)
Pre visit review using our clinic review tool, if applicable. No additional management support is needed unless otherwise documented below in the visit note.  Hold coumadin today and tomorrow and then change dosage and take 2 mg daily except 4 mg on Mon Wed and Friday.  Re-check in 1 week.  Dosing instructions given to daughter and she verbalized understanding also called Crystal with [facility] with dosing instructions and both verbalized understanding.

## 2018-06-22 NOTE — Telephone Encounter (Signed)
Crystal with [facility] called to report that today the patient's INR level was 4.5.

## 2018-06-22 NOTE — Telephone Encounter (Signed)
Sandy Barrett can you address?

## 2018-06-22 NOTE — Telephone Encounter (Signed)
Yes.  I will address this.

## 2018-06-24 ENCOUNTER — Ambulatory Visit: Payer: Medicare Other

## 2018-06-24 ENCOUNTER — Encounter: Payer: Medicare Other | Admitting: Physical Medicine & Rehabilitation

## 2018-06-24 DIAGNOSIS — D473 Essential (hemorrhagic) thrombocythemia: Secondary | ICD-10-CM | POA: Diagnosis not present

## 2018-06-24 DIAGNOSIS — Z4800 Encounter for change or removal of nonsurgical wound dressing: Secondary | ICD-10-CM | POA: Diagnosis not present

## 2018-06-24 DIAGNOSIS — L89150 Pressure ulcer of sacral region, unstageable: Secondary | ICD-10-CM | POA: Diagnosis not present

## 2018-06-24 DIAGNOSIS — I69351 Hemiplegia and hemiparesis following cerebral infarction affecting right dominant side: Secondary | ICD-10-CM | POA: Diagnosis not present

## 2018-06-24 DIAGNOSIS — G629 Polyneuropathy, unspecified: Secondary | ICD-10-CM | POA: Diagnosis not present

## 2018-06-24 DIAGNOSIS — J9611 Chronic respiratory failure with hypoxia: Secondary | ICD-10-CM | POA: Diagnosis not present

## 2018-06-27 ENCOUNTER — Telehealth: Payer: Self-pay

## 2018-06-27 DIAGNOSIS — G629 Polyneuropathy, unspecified: Secondary | ICD-10-CM | POA: Diagnosis not present

## 2018-06-27 DIAGNOSIS — I69351 Hemiplegia and hemiparesis following cerebral infarction affecting right dominant side: Secondary | ICD-10-CM | POA: Diagnosis not present

## 2018-06-27 DIAGNOSIS — L89150 Pressure ulcer of sacral region, unstageable: Secondary | ICD-10-CM | POA: Diagnosis not present

## 2018-06-27 DIAGNOSIS — D473 Essential (hemorrhagic) thrombocythemia: Secondary | ICD-10-CM | POA: Diagnosis not present

## 2018-06-27 DIAGNOSIS — Z4800 Encounter for change or removal of nonsurgical wound dressing: Secondary | ICD-10-CM | POA: Diagnosis not present

## 2018-06-27 DIAGNOSIS — J9611 Chronic respiratory failure with hypoxia: Secondary | ICD-10-CM | POA: Diagnosis not present

## 2018-06-27 NOTE — Telephone Encounter (Signed)
Order for CBC faxed to [facility] at [phone]. Spoke with Sharyn Lull, Publishing copy, at [phone].  She confirmed that CBC would be drawn either today, or Wednesday if pt's home visit had already taken place today.

## 2018-06-28 IMAGING — DX DG CHEST 2V
2 series · 2 of 2 positions shown · non-contrast
Comparison: Radiograph February 12, 2017.

CLINICAL DATA: Chronic respiratory failure with hypoxia.

EXAM:
CHEST - 2 VIEW

[chest lat]
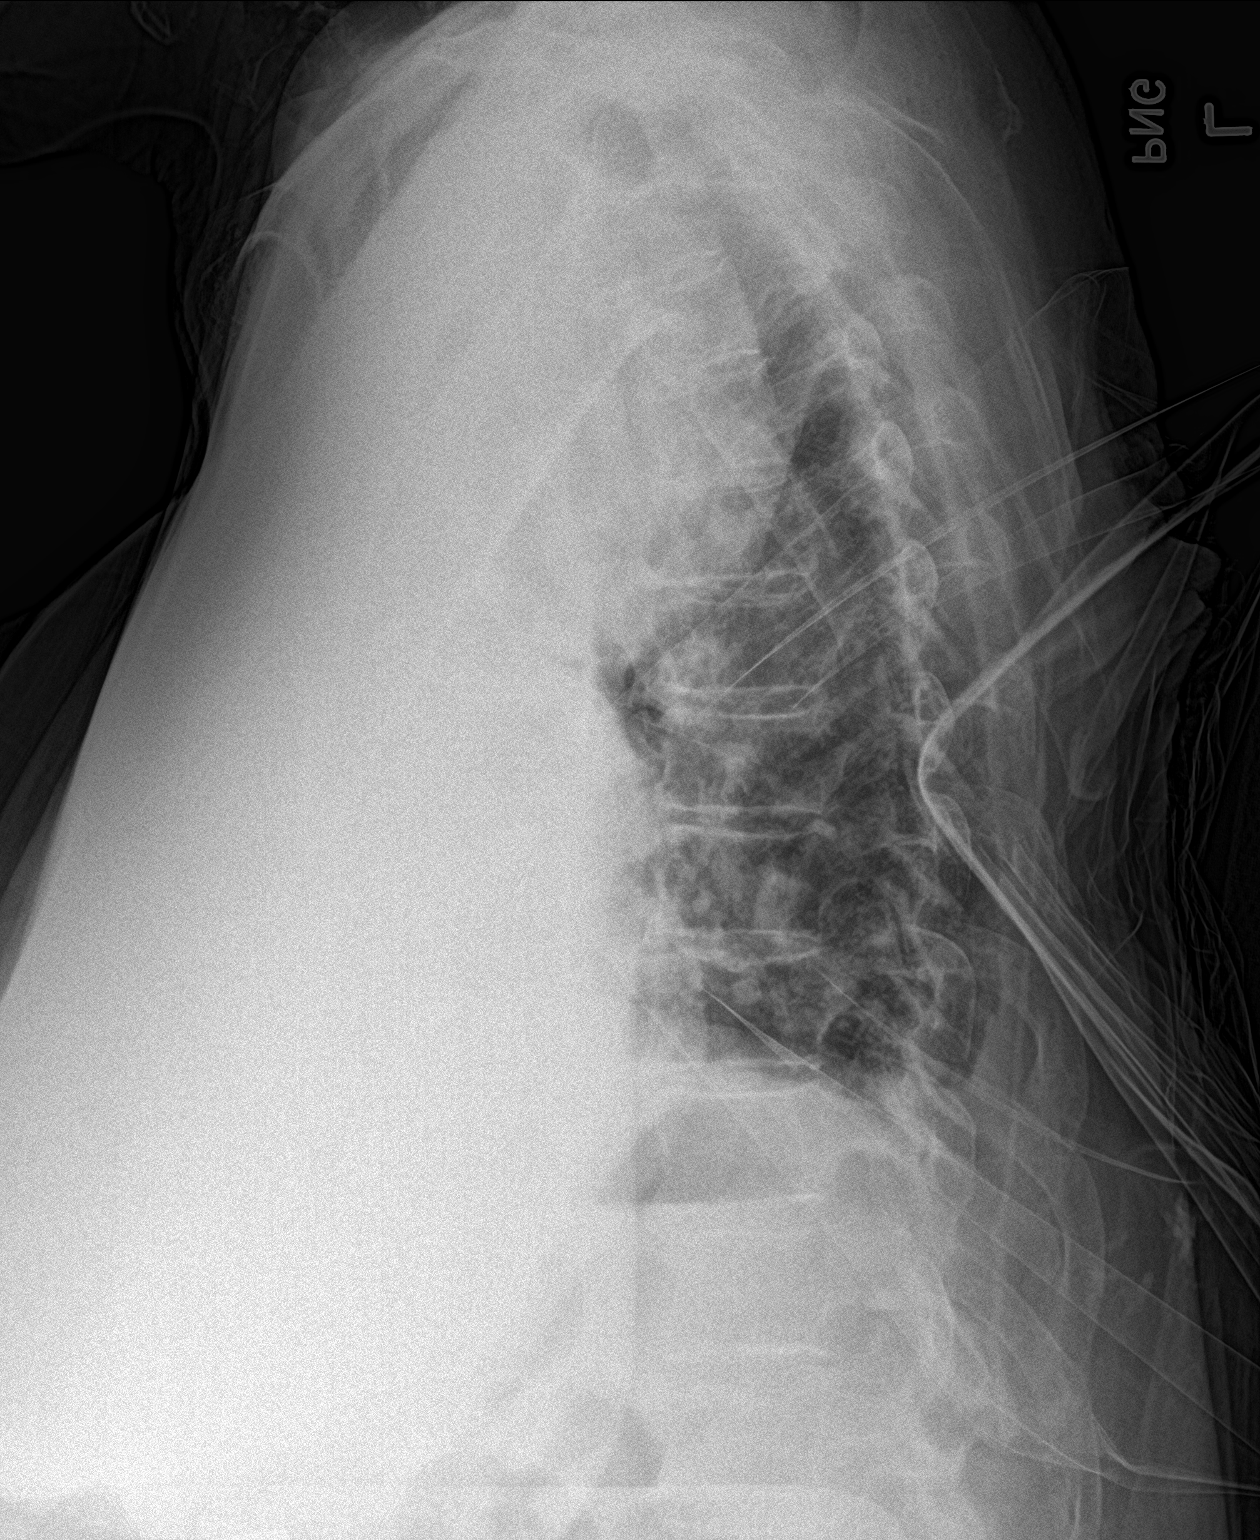

[chest ap]
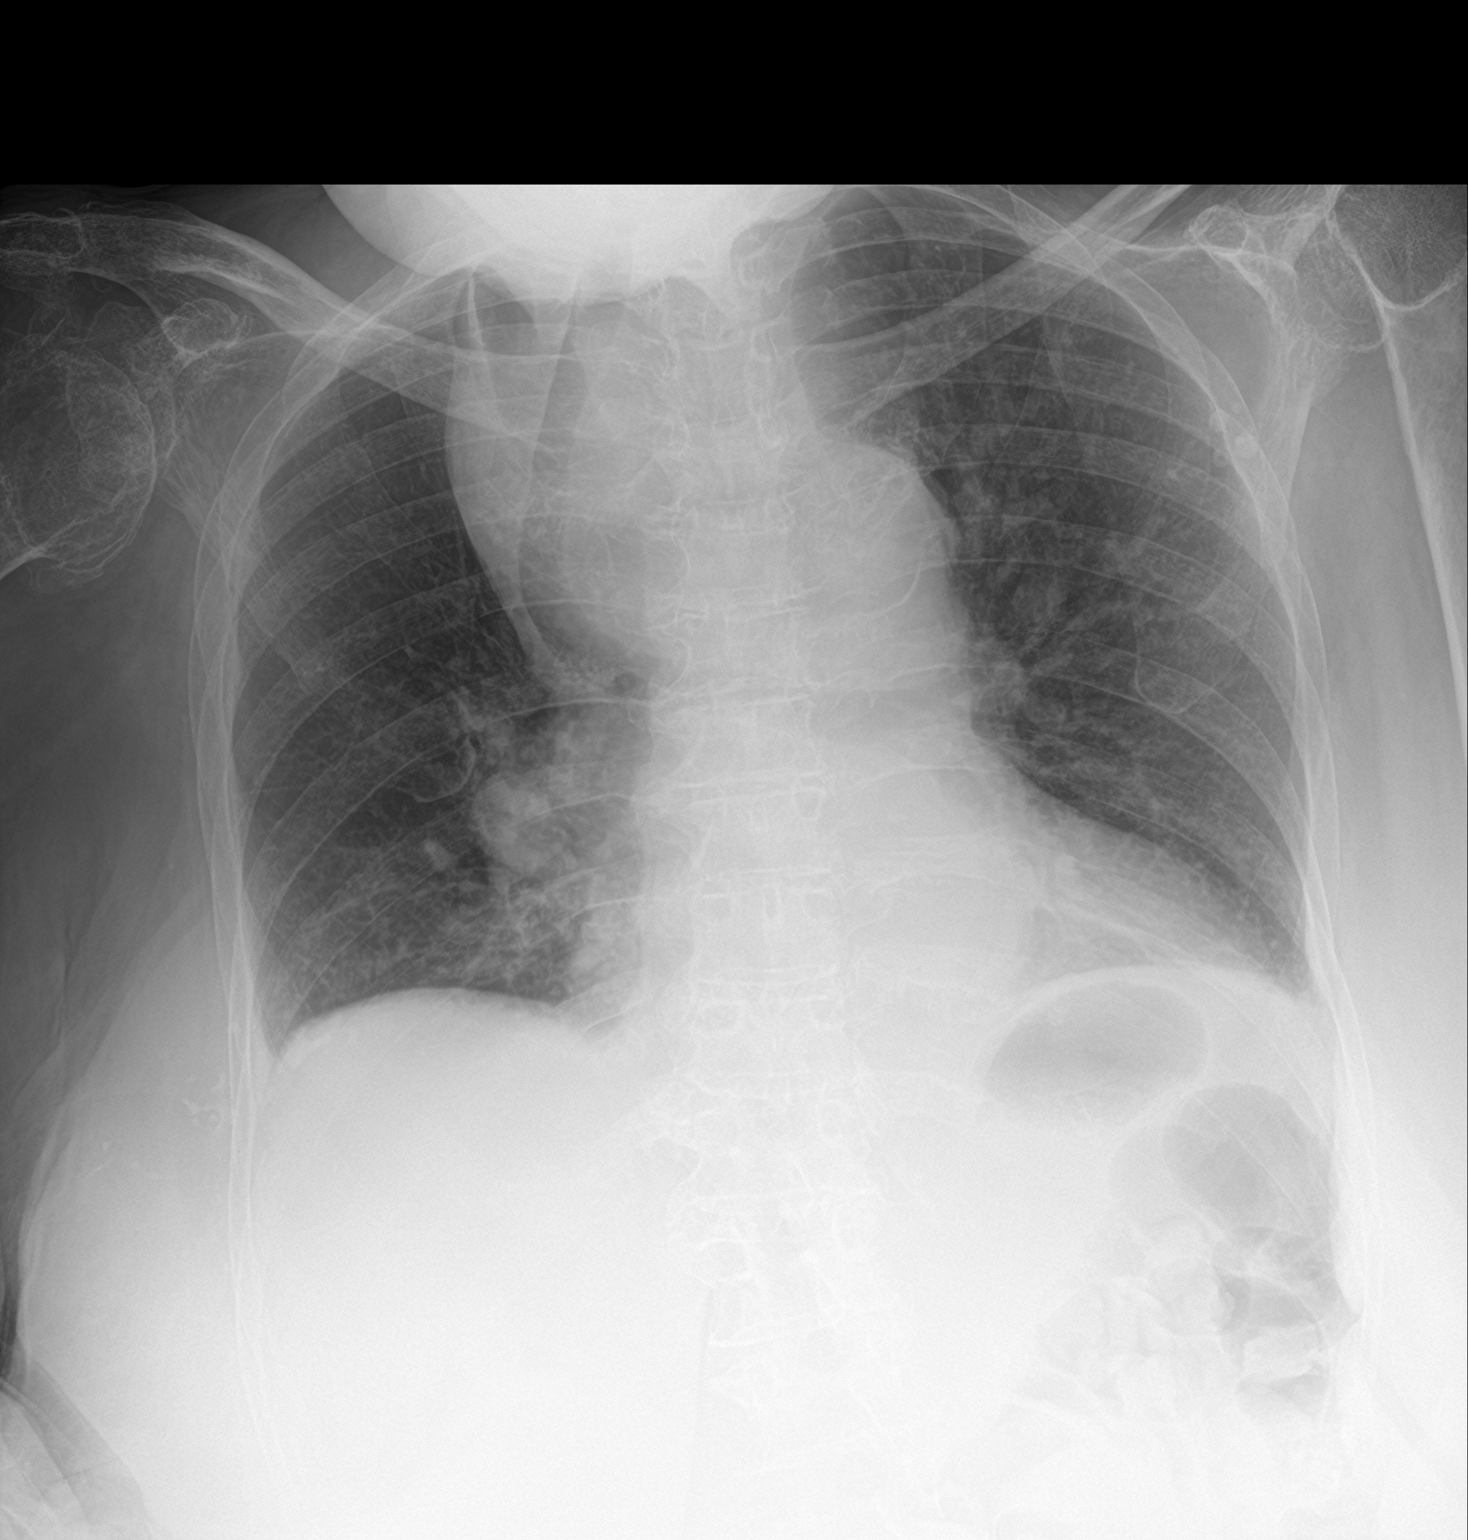

[2 of 2 positions shown; findings below may reference images not displayed]

FINDINGS: Stable cardiac silhouette. Stable tracheal deviation to the right
due to enlarged thyroid gland with right paratracheal prominence. No
pneumothorax or pleural effusion is noted. Atherosclerosis of
thoracic aorta is noted. No acute pulmonary disease is noted.
Degenerative changes seen involving both glenohumeral joints.
IMPRESSION: Stable right tracheal deviation is noted secondary to thyroid
goiter. No acute cardiopulmonary abnormality is noted.

Aortic Atherosclerosis ([id number].T).

## 2018-06-30 ENCOUNTER — Telehealth: Payer: Self-pay | Admitting: Internal Medicine

## 2018-06-30 ENCOUNTER — Telehealth: Payer: Self-pay

## 2018-06-30 DIAGNOSIS — G629 Polyneuropathy, unspecified: Secondary | ICD-10-CM | POA: Diagnosis not present

## 2018-06-30 DIAGNOSIS — J9611 Chronic respiratory failure with hypoxia: Secondary | ICD-10-CM | POA: Diagnosis not present

## 2018-06-30 DIAGNOSIS — D473 Essential (hemorrhagic) thrombocythemia: Secondary | ICD-10-CM | POA: Diagnosis not present

## 2018-06-30 DIAGNOSIS — L89150 Pressure ulcer of sacral region, unstageable: Secondary | ICD-10-CM | POA: Diagnosis not present

## 2018-06-30 DIAGNOSIS — I69351 Hemiplegia and hemiparesis following cerebral infarction affecting right dominant side: Secondary | ICD-10-CM | POA: Diagnosis not present

## 2018-06-30 DIAGNOSIS — Z4800 Encounter for change or removal of nonsurgical wound dressing: Secondary | ICD-10-CM | POA: Diagnosis not present

## 2018-06-30 NOTE — Telephone Encounter (Signed)
Called [facility] home [facility] at [phone] and given below message. They have not drawn labs yet, she is scheduled to have labs drawn today.

## 2018-06-30 NOTE — Telephone Encounter (Signed)
-----   Message from Heath Lark, MD sent at 06/30/2018  7:46 AM EDT ----- Regarding: CBC Home health was supposed to draw CBC yesterday Can you find out?

## 2018-06-30 NOTE — Telephone Encounter (Signed)
Tried to call to give ok for verbal orders. No answer and VM was full. Please advise on INR

## 2018-06-30 NOTE — Telephone Encounter (Signed)
Spoke with Nurse from [facility], requesting verbals to retry blood draw tomorrow by another nurse. Stick was unsuccessful today. Advised ok per MD.

## 2018-06-30 NOTE — Telephone Encounter (Signed)
Routing to cindy/coumadin clinic, please advise, thanks

## 2018-06-30 NOTE — Telephone Encounter (Signed)
Noted  

## 2018-06-30 NOTE — Telephone Encounter (Signed)
Ria Comment with [facility] calling to report a 2.4 INR.  Is it ok to get verbal to go back tomorrow to draw blood. Was unable to draw blood today.  CB- [phone]

## 2018-07-01 ENCOUNTER — Telehealth: Payer: Self-pay

## 2018-07-01 DIAGNOSIS — J9611 Chronic respiratory failure with hypoxia: Secondary | ICD-10-CM | POA: Diagnosis not present

## 2018-07-01 DIAGNOSIS — L89150 Pressure ulcer of sacral region, unstageable: Secondary | ICD-10-CM | POA: Diagnosis not present

## 2018-07-01 DIAGNOSIS — Z4800 Encounter for change or removal of nonsurgical wound dressing: Secondary | ICD-10-CM | POA: Diagnosis not present

## 2018-07-01 DIAGNOSIS — I69351 Hemiplegia and hemiparesis following cerebral infarction affecting right dominant side: Secondary | ICD-10-CM | POA: Diagnosis not present

## 2018-07-01 DIAGNOSIS — D473 Essential (hemorrhagic) thrombocythemia: Secondary | ICD-10-CM | POA: Diagnosis not present

## 2018-07-01 DIAGNOSIS — G629 Polyneuropathy, unspecified: Secondary | ICD-10-CM | POA: Diagnosis not present

## 2018-07-01 NOTE — Telephone Encounter (Signed)
Ok for ua and culture- dysuria 

## 2018-07-01 NOTE — Telephone Encounter (Signed)
Copied from [place] [id number]. Topic: General - Other >> Jul 01, 2018  1:17 PM Yvette Rack wrote: Reason for CRM: Pt daughter [name] called to asking that Dr. Quay Burow or Lovena Le approve an order for the home health care provider to test pt for possible uti. Cb# [phone]  Routing to dr [name], please advise in the absence of dr burns, I will call home health back, thanks

## 2018-07-01 NOTE — Telephone Encounter (Signed)
Home health nurse was advised of ok verbal orders for ua and urine culture---however, home health nurse states it will need to done with In and out cath---I cannot give ok orders for that since dr [name] was not aware that was being requested---home health nurse has already left, will not be returning before Monday---even if I can get order today by another provider, the nurse cannot return to patient's home today---nurse advised that patient can go to ED if symptoms of possible urine infection are bad enough---I will be advising dr burns of this request and also the fact that they were still unsuccessful with drawing/redrawing blood for CBC---patients veins keep rolling/collapsing---do we need to bring patient in for labwork here in our office--she is nonverbal, in wheelchair, covid risk at #6, and would require scat transportation to be arranged to get her here---routing to dr burns, please advise, thanks

## 2018-07-03 NOTE — Addendum Note (Signed)
Addended by: [name] on: 07/03/2018 03:08 PM   Modules accepted: Orders

## 2018-07-03 NOTE — Telephone Encounter (Signed)
[place] for in and out cath    Her daughter requested the blood work so I would ask her if she wants to wait until august or bring her in early

## 2018-07-04 ENCOUNTER — Telehealth: Payer: Self-pay | Admitting: Internal Medicine

## 2018-07-04 DIAGNOSIS — L89150 Pressure ulcer of sacral region, unstageable: Secondary | ICD-10-CM | POA: Diagnosis not present

## 2018-07-04 DIAGNOSIS — Z4800 Encounter for change or removal of nonsurgical wound dressing: Secondary | ICD-10-CM | POA: Diagnosis not present

## 2018-07-04 DIAGNOSIS — I69351 Hemiplegia and hemiparesis following cerebral infarction affecting right dominant side: Secondary | ICD-10-CM | POA: Diagnosis not present

## 2018-07-04 DIAGNOSIS — G629 Polyneuropathy, unspecified: Secondary | ICD-10-CM | POA: Diagnosis not present

## 2018-07-04 DIAGNOSIS — J9611 Chronic respiratory failure with hypoxia: Secondary | ICD-10-CM | POA: Diagnosis not present

## 2018-07-04 DIAGNOSIS — D473 Essential (hemorrhagic) thrombocythemia: Secondary | ICD-10-CM | POA: Diagnosis not present

## 2018-07-04 NOTE — Telephone Encounter (Signed)
I have talked with daughter, advised it is ok with dr burns to do in and out cath if still needed----and advised that if blood work is needed now, before august, call us back to schedule appt with dr burns so that [place] can do labwork while patient is here seeing dr burns---daughter to advise home health nurse today when she come, if any further questions, home health nurse can call our office back, ok to talk with tamara,RN at [facility] office

## 2018-07-04 NOTE — Telephone Encounter (Signed)
Opened in error

## 2018-07-05 ENCOUNTER — Telehealth: Payer: Self-pay

## 2018-07-05 ENCOUNTER — Ambulatory Visit: Payer: Medicare Other | Admitting: Physical Medicine & Rehabilitation

## 2018-07-05 ENCOUNTER — Other Ambulatory Visit: Payer: Self-pay | Admitting: Internal Medicine

## 2018-07-05 NOTE — Telephone Encounter (Signed)
Yes, urine - blood work if daughter wants the nurse to keep trying but multiple attempts have been made and they have been unsuccessful - can wait to do blood work until her next visit, but I will leave that up to her duaghter

## 2018-07-05 NOTE — Telephone Encounter (Signed)
There is a previous note in regards to blood work either being done in August or next time you see her. Stanton Kidney was asking if you wanted her to do a CBC and thyroid panel on pt? Please advise.

## 2018-07-05 NOTE — Telephone Encounter (Signed)
Sandy Barrett aware of results below and expressed understanding.

## 2018-07-05 NOTE — Telephone Encounter (Signed)
Copied from [place] [id number]. Topic: General - Other >> Jul 05, 2018  8:16 AM Keene Breath wrote: Reason for CRM: Stanton Kidney with [facility] called to clarify an order for UA and Bloodwork.  CB# [phone]

## 2018-07-06 ENCOUNTER — Encounter: Payer: Self-pay | Admitting: Internal Medicine

## 2018-07-06 DIAGNOSIS — J9611 Chronic respiratory failure with hypoxia: Secondary | ICD-10-CM | POA: Diagnosis not present

## 2018-07-06 DIAGNOSIS — D473 Essential (hemorrhagic) thrombocythemia: Secondary | ICD-10-CM | POA: Diagnosis not present

## 2018-07-06 DIAGNOSIS — L89150 Pressure ulcer of sacral region, unstageable: Secondary | ICD-10-CM | POA: Diagnosis not present

## 2018-07-06 DIAGNOSIS — G629 Polyneuropathy, unspecified: Secondary | ICD-10-CM | POA: Diagnosis not present

## 2018-07-06 DIAGNOSIS — I69351 Hemiplegia and hemiparesis following cerebral infarction affecting right dominant side: Secondary | ICD-10-CM | POA: Diagnosis not present

## 2018-07-06 DIAGNOSIS — E0590 Thyrotoxicosis, unspecified without thyrotoxic crisis or storm: Secondary | ICD-10-CM | POA: Diagnosis not present

## 2018-07-06 DIAGNOSIS — Z4800 Encounter for change or removal of nonsurgical wound dressing: Secondary | ICD-10-CM | POA: Diagnosis not present

## 2018-07-08 ENCOUNTER — Other Ambulatory Visit (HOSPITAL_COMMUNITY)
Admission: AD | Admit: 2018-07-08 | Discharge: 2018-07-08 | Disposition: A | Discharge status: Home / Self Care | Payer: Medicare Other | Source: Other Acute Inpatient Hospital | Attending: Internal Medicine | Admitting: Internal Medicine

## 2018-07-08 ENCOUNTER — Telehealth: Payer: Self-pay | Admitting: Internal Medicine

## 2018-07-08 DIAGNOSIS — D473 Essential (hemorrhagic) thrombocythemia: Secondary | ICD-10-CM | POA: Diagnosis not present

## 2018-07-08 DIAGNOSIS — J9611 Chronic respiratory failure with hypoxia: Secondary | ICD-10-CM | POA: Diagnosis not present

## 2018-07-08 DIAGNOSIS — N390 Urinary tract infection, site not specified: Secondary | ICD-10-CM | POA: Insufficient documentation

## 2018-07-08 DIAGNOSIS — I69351 Hemiplegia and hemiparesis following cerebral infarction affecting right dominant side: Secondary | ICD-10-CM | POA: Diagnosis not present

## 2018-07-08 DIAGNOSIS — L89150 Pressure ulcer of sacral region, unstageable: Secondary | ICD-10-CM | POA: Diagnosis not present

## 2018-07-08 DIAGNOSIS — Z4800 Encounter for change or removal of nonsurgical wound dressing: Secondary | ICD-10-CM | POA: Diagnosis not present

## 2018-07-08 DIAGNOSIS — G629 Polyneuropathy, unspecified: Secondary | ICD-10-CM | POA: Diagnosis not present

## 2018-07-08 LAB — URINALYSIS, ROUTINE W REFLEX MICROSCOPIC
Bilirubin Urine: NEGATIVE
Glucose, UA: NEGATIVE mg/dL
Hgb urine dipstick: NEGATIVE
Ketones, ur: NEGATIVE mg/dL
Leukocytes,Ua: NEGATIVE
Nitrite: NEGATIVE
Protein, ur: NEGATIVE mg/dL
Specific Gravity, Urine: 1.01 (ref 1.005–1.030)
pH: 5 (ref 5.0–8.0)

## 2018-07-08 NOTE — Telephone Encounter (Signed)
Let her daughter know her blood work was normal - blood counts and thyroid function

## 2018-07-09 DIAGNOSIS — Z9981 Dependence on supplemental oxygen: Secondary | ICD-10-CM | POA: Diagnosis not present

## 2018-07-09 DIAGNOSIS — I69390 Apraxia following cerebral infarction: Secondary | ICD-10-CM | POA: Diagnosis not present

## 2018-07-09 DIAGNOSIS — R1310 Dysphagia, unspecified: Secondary | ICD-10-CM | POA: Diagnosis not present

## 2018-07-09 DIAGNOSIS — I69391 Dysphagia following cerebral infarction: Secondary | ICD-10-CM | POA: Diagnosis not present

## 2018-07-09 DIAGNOSIS — G43909 Migraine, unspecified, not intractable, without status migrainosus: Secondary | ICD-10-CM | POA: Diagnosis not present

## 2018-07-09 DIAGNOSIS — Z86711 Personal history of pulmonary embolism: Secondary | ICD-10-CM | POA: Diagnosis not present

## 2018-07-09 DIAGNOSIS — I69320 Aphasia following cerebral infarction: Secondary | ICD-10-CM | POA: Diagnosis not present

## 2018-07-09 DIAGNOSIS — Z7401 Bed confinement status: Secondary | ICD-10-CM | POA: Diagnosis not present

## 2018-07-09 DIAGNOSIS — Z86718 Personal history of other venous thrombosis and embolism: Secondary | ICD-10-CM | POA: Diagnosis not present

## 2018-07-09 DIAGNOSIS — J9611 Chronic respiratory failure with hypoxia: Secondary | ICD-10-CM | POA: Diagnosis not present

## 2018-07-09 DIAGNOSIS — M150 Primary generalized (osteo)arthritis: Secondary | ICD-10-CM | POA: Diagnosis not present

## 2018-07-09 DIAGNOSIS — Z7901 Long term (current) use of anticoagulants: Secondary | ICD-10-CM | POA: Diagnosis not present

## 2018-07-09 DIAGNOSIS — G629 Polyneuropathy, unspecified: Secondary | ICD-10-CM | POA: Diagnosis not present

## 2018-07-09 DIAGNOSIS — D473 Essential (hemorrhagic) thrombocythemia: Secondary | ICD-10-CM | POA: Diagnosis not present

## 2018-07-09 DIAGNOSIS — I69351 Hemiplegia and hemiparesis following cerebral infarction affecting right dominant side: Secondary | ICD-10-CM | POA: Diagnosis not present

## 2018-07-09 DIAGNOSIS — Z4800 Encounter for change or removal of nonsurgical wound dressing: Secondary | ICD-10-CM | POA: Diagnosis not present

## 2018-07-09 DIAGNOSIS — L89150 Pressure ulcer of sacral region, unstageable: Secondary | ICD-10-CM | POA: Diagnosis not present

## 2018-07-09 DIAGNOSIS — H903 Sensorineural hearing loss, bilateral: Secondary | ICD-10-CM | POA: Diagnosis not present

## 2018-07-09 DIAGNOSIS — E0520 Thyrotoxicosis with toxic multinodular goiter without thyrotoxic crisis or storm: Secondary | ICD-10-CM | POA: Diagnosis not present

## 2018-07-09 DIAGNOSIS — Z87440 Personal history of urinary (tract) infections: Secondary | ICD-10-CM | POA: Diagnosis not present

## 2018-07-10 ENCOUNTER — Other Ambulatory Visit: Payer: Self-pay | Admitting: Internal Medicine

## 2018-07-10 ENCOUNTER — Encounter: Payer: Self-pay | Admitting: Internal Medicine

## 2018-07-11 ENCOUNTER — Telehealth: Payer: Self-pay | Admitting: Internal Medicine

## 2018-07-11 ENCOUNTER — Other Ambulatory Visit: Payer: Self-pay | Admitting: Internal Medicine

## 2018-07-11 DIAGNOSIS — Z4800 Encounter for change or removal of nonsurgical wound dressing: Secondary | ICD-10-CM | POA: Diagnosis not present

## 2018-07-11 DIAGNOSIS — J9611 Chronic respiratory failure with hypoxia: Secondary | ICD-10-CM | POA: Diagnosis not present

## 2018-07-11 DIAGNOSIS — G629 Polyneuropathy, unspecified: Secondary | ICD-10-CM | POA: Diagnosis not present

## 2018-07-11 DIAGNOSIS — L89150 Pressure ulcer of sacral region, unstageable: Secondary | ICD-10-CM | POA: Diagnosis not present

## 2018-07-11 DIAGNOSIS — I69351 Hemiplegia and hemiparesis following cerebral infarction affecting right dominant side: Secondary | ICD-10-CM | POA: Diagnosis not present

## 2018-07-11 DIAGNOSIS — D473 Essential (hemorrhagic) thrombocythemia: Secondary | ICD-10-CM | POA: Diagnosis not present

## 2018-07-11 LAB — URINE CULTURE: Culture: 50000 — AB

## 2018-07-11 MED ORDER — NITROFURANTOIN MONOHYD MACRO 100 MG PO CAPS: 100.0000 mg | ORAL_CAPSULE | Freq: Two times a day (BID) | ORAL | 0 refills | Status: DC

## 2018-07-11 NOTE — Telephone Encounter (Signed)
See other note

## 2018-07-11 NOTE — Telephone Encounter (Signed)
Daughter is aware. Asked how urine looked from Friday.

## 2018-07-11 NOTE — Telephone Encounter (Signed)
Patient's daughter informed of below. Rx sent.

## 2018-07-11 NOTE — Telephone Encounter (Signed)
ok 

## 2018-07-11 NOTE — Telephone Encounter (Signed)
Home Health Verbal Orders - Caller/Agency: [place] Number: [phone] [name] Requesting OT/PT/Skilled Nursing/Social  Work/Speech Therapy: Treatment for new wounds and what to put on it

## 2018-07-11 NOTE — Telephone Encounter (Signed)
Urine shows an infection - lets try nitrofurantoin - this is effective against the bacteria -- rx pending

## 2018-07-11 NOTE — Telephone Encounter (Signed)
Daughter aware.

## 2018-07-11 NOTE — Telephone Encounter (Signed)
[place] Controlled Database Checked Last filled: 06/10/18 # 90 LOV w/you: 05/25/18 Next appt w/you: None

## 2018-07-12 NOTE — Telephone Encounter (Signed)
Called [name] there was no answer LMOM w/MD response.Marland KitchenJohny Chess

## 2018-07-13 DIAGNOSIS — G629 Polyneuropathy, unspecified: Secondary | ICD-10-CM | POA: Diagnosis not present

## 2018-07-13 DIAGNOSIS — I69351 Hemiplegia and hemiparesis following cerebral infarction affecting right dominant side: Secondary | ICD-10-CM | POA: Diagnosis not present

## 2018-07-13 DIAGNOSIS — D473 Essential (hemorrhagic) thrombocythemia: Secondary | ICD-10-CM | POA: Diagnosis not present

## 2018-07-13 DIAGNOSIS — J9611 Chronic respiratory failure with hypoxia: Secondary | ICD-10-CM | POA: Diagnosis not present

## 2018-07-13 DIAGNOSIS — L89150 Pressure ulcer of sacral region, unstageable: Secondary | ICD-10-CM | POA: Diagnosis not present

## 2018-07-13 DIAGNOSIS — Z4800 Encounter for change or removal of nonsurgical wound dressing: Secondary | ICD-10-CM | POA: Diagnosis not present

## 2018-07-15 DIAGNOSIS — G629 Polyneuropathy, unspecified: Secondary | ICD-10-CM | POA: Diagnosis not present

## 2018-07-15 DIAGNOSIS — D473 Essential (hemorrhagic) thrombocythemia: Secondary | ICD-10-CM | POA: Diagnosis not present

## 2018-07-15 DIAGNOSIS — L89150 Pressure ulcer of sacral region, unstageable: Secondary | ICD-10-CM | POA: Diagnosis not present

## 2018-07-15 DIAGNOSIS — J9611 Chronic respiratory failure with hypoxia: Secondary | ICD-10-CM | POA: Diagnosis not present

## 2018-07-15 DIAGNOSIS — Z4800 Encounter for change or removal of nonsurgical wound dressing: Secondary | ICD-10-CM | POA: Diagnosis not present

## 2018-07-15 DIAGNOSIS — I69351 Hemiplegia and hemiparesis following cerebral infarction affecting right dominant side: Secondary | ICD-10-CM | POA: Diagnosis not present

## 2018-07-18 DIAGNOSIS — I69351 Hemiplegia and hemiparesis following cerebral infarction affecting right dominant side: Secondary | ICD-10-CM | POA: Diagnosis not present

## 2018-07-18 DIAGNOSIS — Z4800 Encounter for change or removal of nonsurgical wound dressing: Secondary | ICD-10-CM | POA: Diagnosis not present

## 2018-07-18 DIAGNOSIS — L89150 Pressure ulcer of sacral region, unstageable: Secondary | ICD-10-CM | POA: Diagnosis not present

## 2018-07-18 DIAGNOSIS — G629 Polyneuropathy, unspecified: Secondary | ICD-10-CM | POA: Diagnosis not present

## 2018-07-18 DIAGNOSIS — D473 Essential (hemorrhagic) thrombocythemia: Secondary | ICD-10-CM | POA: Diagnosis not present

## 2018-07-18 DIAGNOSIS — J9611 Chronic respiratory failure with hypoxia: Secondary | ICD-10-CM | POA: Diagnosis not present

## 2018-07-19 ENCOUNTER — Encounter: Payer: Medicare Other | Attending: Physical Medicine & Rehabilitation | Admitting: Physical Medicine & Rehabilitation

## 2018-07-19 DIAGNOSIS — G8110 Spastic hemiplegia affecting unspecified side: Secondary | ICD-10-CM | POA: Insufficient documentation

## 2018-07-20 ENCOUNTER — Telehealth: Payer: Self-pay | Admitting: Internal Medicine

## 2018-07-20 DIAGNOSIS — I69351 Hemiplegia and hemiparesis following cerebral infarction affecting right dominant side: Secondary | ICD-10-CM | POA: Diagnosis not present

## 2018-07-20 DIAGNOSIS — L89150 Pressure ulcer of sacral region, unstageable: Secondary | ICD-10-CM | POA: Diagnosis not present

## 2018-07-20 DIAGNOSIS — D473 Essential (hemorrhagic) thrombocythemia: Secondary | ICD-10-CM | POA: Diagnosis not present

## 2018-07-20 DIAGNOSIS — G629 Polyneuropathy, unspecified: Secondary | ICD-10-CM | POA: Diagnosis not present

## 2018-07-20 DIAGNOSIS — L8990 Pressure ulcer of unspecified site, unspecified stage: Secondary | ICD-10-CM

## 2018-07-20 DIAGNOSIS — J9611 Chronic respiratory failure with hypoxia: Secondary | ICD-10-CM | POA: Diagnosis not present

## 2018-07-20 DIAGNOSIS — Z4800 Encounter for change or removal of nonsurgical wound dressing: Secondary | ICD-10-CM | POA: Diagnosis not present

## 2018-07-20 NOTE — Telephone Encounter (Signed)
Spoke with [name] to inform, will call Friday to give INR results.

## 2018-07-20 NOTE — Telephone Encounter (Signed)
[facility] called today about Mattress overlay - they told the daughter that they have available a low  Air loss mattress, she was told this was good for pts who have significant bed sores.  Daughter wants to know if this is a better choice for pt?  If it is can you send an order for it? Daughter [name] number is [phone]

## 2018-07-20 NOTE — Telephone Encounter (Signed)
Yes, needs INR.   Use oxygen at night and prn during the day.  If her breathing gets worse or she has new symptoms she needs to be evaluated

## 2018-07-20 NOTE — Telephone Encounter (Signed)
Spoke with daughter Rosanne Ashing to inform that oxygen can be used PRN with sats under 90%. Can check O2 every 1-2 hours.

## 2018-07-20 NOTE — Telephone Encounter (Signed)
[name], LPN with [facility] calling to notify Dr. Quay Burow that today when the patients OT SAT was 84-88 today and she advised her to breath deep and slow to help raise it up, her OT SAT did not increase. Tis normally works when she does it, so [name] gave her oxygen and it went up to 94 with no distress. She typically uses oxygen at night and usually doesn't need it during the day. Please advise if needed.  Patient daughter also wasn't sure if patient needed her INR checked?

## 2018-07-22 ENCOUNTER — Telehealth: Payer: Self-pay | Admitting: Internal Medicine

## 2018-07-22 DIAGNOSIS — Z4800 Encounter for change or removal of nonsurgical wound dressing: Secondary | ICD-10-CM | POA: Diagnosis not present

## 2018-07-22 DIAGNOSIS — G629 Polyneuropathy, unspecified: Secondary | ICD-10-CM | POA: Diagnosis not present

## 2018-07-22 DIAGNOSIS — L89150 Pressure ulcer of sacral region, unstageable: Secondary | ICD-10-CM | POA: Diagnosis not present

## 2018-07-22 DIAGNOSIS — I69351 Hemiplegia and hemiparesis following cerebral infarction affecting right dominant side: Secondary | ICD-10-CM | POA: Diagnosis not present

## 2018-07-22 DIAGNOSIS — D473 Essential (hemorrhagic) thrombocythemia: Secondary | ICD-10-CM | POA: Diagnosis not present

## 2018-07-22 DIAGNOSIS — J9611 Chronic respiratory failure with hypoxia: Secondary | ICD-10-CM | POA: Diagnosis not present

## 2018-07-22 NOTE — Telephone Encounter (Signed)
[facility] lpn with [facility] is calling her machine to check patient PT/INR is broken and she would like to know if Monday will be ok to check patient PT/inr

## 2018-07-22 NOTE — Telephone Encounter (Signed)
Left detailed message for [name] informing of same.

## 2018-07-22 NOTE — Telephone Encounter (Signed)
Is it okay to delay INR check by days?

## 2018-07-22 NOTE — Telephone Encounter (Signed)
Yes, ok but should be Monday since last INR was high

## 2018-07-23 NOTE — Telephone Encounter (Signed)
DME order placed - should have printed.  Note amended.

## 2018-07-25 ENCOUNTER — Encounter: Payer: Self-pay | Admitting: Internal Medicine

## 2018-07-25 DIAGNOSIS — J9611 Chronic respiratory failure with hypoxia: Secondary | ICD-10-CM | POA: Diagnosis not present

## 2018-07-25 DIAGNOSIS — D473 Essential (hemorrhagic) thrombocythemia: Secondary | ICD-10-CM | POA: Diagnosis not present

## 2018-07-25 DIAGNOSIS — I69351 Hemiplegia and hemiparesis following cerebral infarction affecting right dominant side: Secondary | ICD-10-CM | POA: Diagnosis not present

## 2018-07-25 DIAGNOSIS — L89150 Pressure ulcer of sacral region, unstageable: Secondary | ICD-10-CM | POA: Diagnosis not present

## 2018-07-25 DIAGNOSIS — G629 Polyneuropathy, unspecified: Secondary | ICD-10-CM | POA: Diagnosis not present

## 2018-07-25 DIAGNOSIS — Z4800 Encounter for change or removal of nonsurgical wound dressing: Secondary | ICD-10-CM | POA: Diagnosis not present

## 2018-07-25 DIAGNOSIS — L89159 Pressure ulcer of sacral region, unspecified stage: Secondary | ICD-10-CM

## 2018-07-26 ENCOUNTER — Ambulatory Visit (INDEPENDENT_AMBULATORY_CARE_PROVIDER_SITE_OTHER): Payer: Medicare Other | Admitting: General Practice

## 2018-07-26 DIAGNOSIS — Z7901 Long term (current) use of anticoagulants: Secondary | ICD-10-CM

## 2018-07-26 LAB — POCT INR: INR: 2.1 (ref 2.0–3.0)

## 2018-07-26 NOTE — Patient Instructions (Addendum)
Pre visit review using our clinic review tool, if applicable. No additional management support is needed unless otherwise documented below in the visit note.  Patient's dtr called and reported an upcoming procedure on 7/24.  Patient is having a procedure on Friday and will be holding coumadin for 5 days along with a Lovenox bridge.  Patient's daughter has been given instructions from her surgeons office at [facility].  Juliann Pulse, RN at [place] will be checking patient's INR on Wednesday of next week.

## 2018-07-27 DIAGNOSIS — D473 Essential (hemorrhagic) thrombocythemia: Secondary | ICD-10-CM | POA: Diagnosis not present

## 2018-07-27 DIAGNOSIS — Z4800 Encounter for change or removal of nonsurgical wound dressing: Secondary | ICD-10-CM | POA: Diagnosis not present

## 2018-07-27 DIAGNOSIS — L89150 Pressure ulcer of sacral region, unstageable: Secondary | ICD-10-CM | POA: Diagnosis not present

## 2018-07-27 DIAGNOSIS — G629 Polyneuropathy, unspecified: Secondary | ICD-10-CM | POA: Diagnosis not present

## 2018-07-27 DIAGNOSIS — I69351 Hemiplegia and hemiparesis following cerebral infarction affecting right dominant side: Secondary | ICD-10-CM | POA: Diagnosis not present

## 2018-07-27 DIAGNOSIS — J9611 Chronic respiratory failure with hypoxia: Secondary | ICD-10-CM | POA: Diagnosis not present

## 2018-07-28 NOTE — Telephone Encounter (Signed)
All orders faxed.

## 2018-07-29 ENCOUNTER — Telehealth: Payer: Self-pay

## 2018-07-29 DIAGNOSIS — I959 Hypotension, unspecified: Secondary | ICD-10-CM | POA: Diagnosis not present

## 2018-07-29 DIAGNOSIS — S40022A Contusion of left upper arm, initial encounter: Secondary | ICD-10-CM | POA: Insufficient documentation

## 2018-07-29 DIAGNOSIS — G43909 Migraine, unspecified, not intractable, without status migrainosus: Secondary | ICD-10-CM | POA: Diagnosis present

## 2018-07-29 DIAGNOSIS — E0500 Thyrotoxicosis with diffuse goiter without thyrotoxic crisis or storm: Secondary | ICD-10-CM | POA: Diagnosis present

## 2018-07-29 DIAGNOSIS — D751 Secondary polycythemia: Secondary | ICD-10-CM | POA: Diagnosis present

## 2018-07-29 DIAGNOSIS — D473 Essential (hemorrhagic) thrombocythemia: Secondary | ICD-10-CM | POA: Diagnosis not present

## 2018-07-29 DIAGNOSIS — R1310 Dysphagia, unspecified: Secondary | ICD-10-CM | POA: Diagnosis not present

## 2018-07-29 DIAGNOSIS — X58XXXA Exposure to other specified factors, initial encounter: Secondary | ICD-10-CM | POA: Diagnosis not present

## 2018-07-29 DIAGNOSIS — G629 Polyneuropathy, unspecified: Secondary | ICD-10-CM | POA: Diagnosis present

## 2018-07-29 DIAGNOSIS — G8111 Spastic hemiplegia affecting right dominant side: Secondary | ICD-10-CM | POA: Diagnosis present

## 2018-07-29 DIAGNOSIS — I160 Hypertensive urgency: Secondary | ICD-10-CM | POA: Diagnosis not present

## 2018-07-29 DIAGNOSIS — Z79891 Long term (current) use of opiate analgesic: Secondary | ICD-10-CM | POA: Diagnosis not present

## 2018-07-29 DIAGNOSIS — L7632 Postprocedural hematoma of skin and subcutaneous tissue following other procedure: Secondary | ICD-10-CM | POA: Diagnosis present

## 2018-07-29 DIAGNOSIS — L89154 Pressure ulcer of sacral region, stage 4: Secondary | ICD-10-CM | POA: Diagnosis not present

## 2018-07-29 DIAGNOSIS — Z7901 Long term (current) use of anticoagulants: Secondary | ICD-10-CM | POA: Diagnosis not present

## 2018-07-29 DIAGNOSIS — I69320 Aphasia following cerebral infarction: Secondary | ICD-10-CM | POA: Diagnosis not present

## 2018-07-29 DIAGNOSIS — E049 Nontoxic goiter, unspecified: Secondary | ICD-10-CM | POA: Diagnosis not present

## 2018-07-29 DIAGNOSIS — G8929 Other chronic pain: Secondary | ICD-10-CM | POA: Diagnosis present

## 2018-07-29 DIAGNOSIS — Z8673 Personal history of transient ischemic attack (TIA), and cerebral infarction without residual deficits: Secondary | ICD-10-CM | POA: Diagnosis not present

## 2018-07-29 DIAGNOSIS — I999 Unspecified disorder of circulatory system: Secondary | ICD-10-CM | POA: Diagnosis not present

## 2018-07-29 DIAGNOSIS — M1990 Unspecified osteoarthritis, unspecified site: Secondary | ICD-10-CM | POA: Diagnosis present

## 2018-07-29 DIAGNOSIS — G4734 Idiopathic sleep related nonobstructive alveolar hypoventilation: Secondary | ICD-10-CM | POA: Diagnosis present

## 2018-07-29 DIAGNOSIS — Z79899 Other long term (current) drug therapy: Secondary | ICD-10-CM | POA: Diagnosis not present

## 2018-07-29 DIAGNOSIS — Z86718 Personal history of other venous thrombosis and embolism: Secondary | ICD-10-CM | POA: Diagnosis not present

## 2018-07-29 DIAGNOSIS — I10 Essential (primary) hypertension: Secondary | ICD-10-CM | POA: Diagnosis present

## 2018-07-29 NOTE — Telephone Encounter (Signed)
Copied from [place] [id number]. Topic: General - Other >> Jul 29, 2018  9:00 AM Jodie Echevaria wrote: Reason for CRM: Joelene Millin with [facility] called to request the visit for today to be omitted because patient is having an outpatient procedure this morning. Any questions Joelene Millin can be reached at Ph# [phone]

## 2018-07-29 NOTE — Telephone Encounter (Signed)
Gave ok for orders per Dr. Burns.  

## 2018-08-01 ENCOUNTER — Telehealth: Payer: Self-pay | Admitting: *Deleted

## 2018-08-01 MED ORDER — GENERIC EXTERNAL MEDICATION
650.00 | Status: DC
Start: ? — End: 2018-08-01

## 2018-08-01 MED ORDER — METHIMAZOLE 5 MG PO TABS
5.00 | ORAL_TABLET | ORAL | Status: DC
Start: 2018-08-04 — End: 2018-08-01

## 2018-08-01 MED ORDER — GENERIC EXTERNAL MEDICATION
500.00 | Status: DC
Start: ? — End: 2018-08-01

## 2018-08-01 MED ORDER — PROPRANOLOL HCL 10 MG PO TABS
5.00 | ORAL_TABLET | ORAL | Status: DC
Start: 2018-07-31 — End: 2018-08-01

## 2018-08-01 MED ORDER — TRAZODONE HCL 50 MG PO TABS
50.00 | ORAL_TABLET | ORAL | Status: DC
Start: 2018-07-31 — End: 2018-08-01

## 2018-08-01 MED ORDER — PANTOPRAZOLE SODIUM 40 MG PO TBEC
40.00 | DELAYED_RELEASE_TABLET | ORAL | Status: DC
Start: 2018-08-01 — End: 2018-08-01

## 2018-08-01 MED ORDER — GENERIC EXTERNAL MEDICATION
1000.00 | Status: DC
Start: 2018-08-03 — End: 2018-08-01

## 2018-08-01 MED ORDER — PREGABALIN 50 MG PO CAPS
50.00 | ORAL_CAPSULE | ORAL | Status: DC
Start: 2018-07-31 — End: 2018-08-01

## 2018-08-01 MED ORDER — TRAMADOL HCL 50 MG PO TABS
100.00 | ORAL_TABLET | ORAL | Status: DC
Start: ? — End: 2018-08-01

## 2018-08-01 MED ORDER — POLYETHYLENE GLYCOL 3350 17 G PO PACK
17.00 | PACK | ORAL | Status: DC
Start: ? — End: 2018-08-01

## 2018-08-01 MED ORDER — LIDOCAINE HCL 1 % IJ SOLN
0.50 | INTRAMUSCULAR | Status: DC
Start: ? — End: 2018-08-01

## 2018-08-01 MED ORDER — ENOXAPARIN SODIUM 100 MG/ML ~~LOC~~ SOLN
1.00 | SUBCUTANEOUS | Status: DC
Start: 2018-07-31 — End: 2018-08-01

## 2018-08-01 NOTE — Telephone Encounter (Signed)
Rec'd notification off Patient [name] Portal stating on 07/29/18 10:54 PM pt was admitted to [facility] inpatient for Pressure ulcer of sacral region, stage 4 and Unspecified disorder of circulatory system. On 07/31/18 @ 7:21 PM pt was discharged from [facility] inpatient to Home - with [place]. will call pt to follow=up and make hosp f/u appt if need too.Sandy KitchenJohny Chess

## 2018-08-01 NOTE — Telephone Encounter (Signed)
Called pt daughter [name]) verified hosp discharge from this past weekend. Daughter confirm discharge from [facility]. Completed TCM call below.[name]  Transition Care Management Follow-up Telephone Call   Date discharged? 07/31/18   How have you been since you were released from the hospital? Daughter states she is doing ok she is resting now   Do you understand why you were in the hospital? YES   Do you understand the discharge instructions? YES   Where were you discharged to? HOME   Items Reviewed: Medications reviewed: YES, Patient was restarted warfarin with lovenox bridge   Allergies reviewed: YES, she states no changes  Dietary changes reviewed: YES, heart healthy  Referrals reviewed: YES, she states no one has contacted her about home health service. She states she inform [facility] that she would like for [facility] to do home health services again   Functional Questionnaire:   Activities of Daily Living (ADLs):   She states she are independent in the following: feeding, continence, grooming and toileting States she require assistance with the following: ambulation, bathing and hygiene and dressing   Any transportation issues/concerns?: NO   Any patient concerns? NO   Confirmed importance and date/time of follow-up visits scheduled YES, appt 08/05/18  Provider Appointment booked with Dr. Quay Burow  Confirmed with patient if condition begins to worsen call PCP or go to the ER.  Patient was given the office number and encouraged to call back with question or concerns.  :

## 2018-08-02 ENCOUNTER — Ambulatory Visit (INDEPENDENT_AMBULATORY_CARE_PROVIDER_SITE_OTHER): Payer: Medicare Other | Admitting: General Practice

## 2018-08-02 DIAGNOSIS — L89150 Pressure ulcer of sacral region, unstageable: Secondary | ICD-10-CM | POA: Diagnosis not present

## 2018-08-02 DIAGNOSIS — J9611 Chronic respiratory failure with hypoxia: Secondary | ICD-10-CM | POA: Diagnosis not present

## 2018-08-02 DIAGNOSIS — G629 Polyneuropathy, unspecified: Secondary | ICD-10-CM | POA: Diagnosis not present

## 2018-08-02 DIAGNOSIS — D473 Essential (hemorrhagic) thrombocythemia: Secondary | ICD-10-CM | POA: Diagnosis not present

## 2018-08-02 DIAGNOSIS — Z4800 Encounter for change or removal of nonsurgical wound dressing: Secondary | ICD-10-CM | POA: Diagnosis not present

## 2018-08-02 DIAGNOSIS — Z7901 Long term (current) use of anticoagulants: Secondary | ICD-10-CM

## 2018-08-02 DIAGNOSIS — I69351 Hemiplegia and hemiparesis following cerebral infarction affecting right dominant side: Secondary | ICD-10-CM | POA: Diagnosis not present

## 2018-08-02 LAB — POCT INR: INR: 1.4 — AB (ref 2.0–3.0)

## 2018-08-02 NOTE — Patient Instructions (Addendum)
Pre visit review using our clinic review tool, if applicable. No additional management support is needed unless otherwise documented below in the visit note.  Take 6 mg today and tomorrow, 4 mg on Thurs and Friday and 2 mg on Sat and Sunday.  4 mg on Monday and re-check in 1 week.  Instructions given to Juliann Pulse, RN @ [facility].  Re-check INR in 1 week.  Continue Lovenox.

## 2018-08-04 NOTE — Progress Notes (Signed)
++0   Subjective:    Patient ID: Sandy Barrett, female    DOB: August 05, 1932, 85 y.o.   MRN: [id number]  HPI The patient is here for follow up from the hospital.  Her daughter is with her.   Admitted at [facility] 7/24-7/26  She was admitted for embolization of her goiter via the left radial artery.  At 1 hour after deflation of compression device on the left radial artery she developed swelling of the hand concerning for possible hematoma.  Vascular and plastic/hand surgery evaluated patient and did not find any evidence of compartment syndrome.  No surgical intervention was necessary.  She was admitted for observation.  Her last dose of Coumadin was 7/19 and she was on Lovenox 40 mg daily, but did not take a dose 7/25.  Left hand hematoma: Due to radial access for thyroid goiter embolization Anticoagulation was held Plastic surgery followed, but no evidence of compartment syndrome and no intervention needed Anticoagulation restarted at discharge INR to be followed by [facility] for wound management  Goiter s/p embolization: With hyperthyroidism Following with endocrine Successfully embolized by interventional radiology Continued on methimazole Will have TFTs done on 7/28, 7/31 and 8/21 and results forwarded to endocrine  Stage IV sacral ulcer, 3 other ulcers stage unspecified chronic: This was there prior to admission Wound care consulted, but did not see patient - she has an outpatient appointment Has home health monitoring This has been a recurring problem  Hypertensive urgency: Systolic BPs more than 335 in IR suite, but returned to normal once admitted No additional medication given BP at that time taken on right arm, which is not typically used  History of L MCA stroke, history of DVT: On warfarin as an outpatient-bridged with Lovenox prior to procedure INR drawn 7/28 [name] saw patient today - INR 2.3 - instructed on dosing   Left hand hematoma: Her daughter  states she has been doing well since leaving the hospital.  Her left hand does seem more swollen today than yesterday.  It is very bruised and purplish in color that extends up to her mid forearm.  She has been trying to get her to elevate the hand, but that is not always easy.  She has a couple of blisters on the posterior aspect of her hand that are covered.  Hyperthyroidism: Home health has difficulty getting her blood work and she would like to have her TFTs checked here for endocrine.  Hypertension: She did have an episode of very elevated blood pressure while in the hospital.  Her daughter states they were checking her blood pressure on the right arm which is not done typically and that could have caused an error in the measurement.    Pressure ulcers: She has a home health nurse coming to help her treat the pressure ulcers.  She has been treating these ulcers at home for well over two months.  She currently has 4 ulcers. They are all in the sacral region.  She would benefit from a Low air loss mattress.  She currently has 2 stage II ulcers and two stage 3 ulcers.  The date of onset was mid-May 2020.  The measurements for the largest stage 3 ulcer is 4.8 cm L x 4.4 cm W x 0.3 cm D.  The other stage 3 ulcer is 0.5 cm L x 0.5 cm W x 0.6 cm D.  ( measured 07/03/18).    She has had frequent assessment by the home health nurse who  specializes in wound care and was treating her with a comprehensive ulcer treatment. The treatment plan has been changing dressing three times a week.  Placing Aqua cel extra Ag to wound bed, cover with foam dressing.   She has been repositioned frequently.  She has had appropriate and frequent changing to avoid moisture in the area secondary to incontinence.   She has an appointment with the wound clinic this month.      Medications and allergies reviewed with patient and updated if appropriate.  Patient Active Problem List   Diagnosis Date Noted  . Sleep difficulties  05/24/2018  . Eye discomfort, right 03/09/2018  . Right shoulder pain 11/24/2017  . Hallucinations 11/24/2017  . Adhesive capsulitis of right shoulder 11/23/2017  . Bilateral leg edema 05/24/2017  . Cholecystitis 02/23/2017  . Chronic respiratory failure with hypoxia ([place]) 02/23/2017  . Neuralgia 01/09/2017  . Venous congestion 10/08/2016  . Lymphedema of breast 09/11/2016  . Long term (current) use of anticoagulants [Z79.01] 09/10/2016  . Left hip pain 08/09/2016  . Left leg swelling 05/18/2016  . Pressure ulcer 04/27/2016  . Aspiration pneumonia ([place]) 04/20/2016  . Hemidiaphragm paralysis   . Wheelchair bound 02/03/2016  . Discoloration of skin 09/26/2015  . Essential thrombocytosis ([place]) 07/31/2015  . SVC (superior vena cava obstruction), chronic 07/20/2015  . Dysphagia 07/20/2015  . Constipation 07/20/2015  . Thrombophlebitis of breast, right 06/18/2015  . Tracheal deviation 06/06/2015  . Sensorineural hearing loss, bilateral, moderate-moderately severe 04/30/2015  . Abdominal wall lump 03/09/2015  . Frequent UTI 02/06/2015  . Primary osteoarthritis involving multiple joints 07/26/2014  . Pernicious anemia 07/26/2014  . Encounter for therapeutic drug monitoring 07/17/2014  . Spastic hemiparesis affecting dominant side ([place]), right 05/31/2014  . DVT of lower extremity, bilateral ([place]) 03/14/2014  . Global aphasia 03/14/2014  . 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 Aphasia S/P CVA 03/13/2014  . Cerebral infarction due to embolism of left middle cerebral artery ([place])   . Stroke, embolic ([place]) 35/45/6256  . History of pulmonary embolism 03/08/2014  . Primary localized osteoarthrosis, lower leg 03/06/2013  . IBS (irritable bowel syndrome)   . Multinodular goiter 01/13/2011  . Hyperthyroidism 11/11/2009  . Osteoarthritis 04/21/2008  . Migraine headache 04/20/2008    Current Outpatient Medications on File Prior to Visit  Medication Sig Dispense Refill  . acetaminophen  (TYLENOL) 160 MG/5ML liquid Take 480 mg by mouth every 4 (four) hours as needed for fever or pain.    . calamine lotion Apply 1 application as needed topically for itching. 120 mL 0  . chlorhexidine (PERIDEX) 0.12 % solution RINSE TWICE A DAY FOR 1 MINUTE  5  . ciclopirox (PENLAC) 8 % solution Apply topically at bedtime. Apply over nail & surrounding skin. Apply Qd over previous coat. After 7 days, remove w alcohol, continue cycle. 6.6 mL 5  . diclofenac sodium (VOLTAREN) 1 % GEL APPLY 2 G TOPICALLY 3 (THREE) TIMES DAILY AS NEEDED. 300 g 0  . hydroxyurea (HYDREA) 500 MG capsule Take 1 tablet (500 mg) daily except 2 tablets ( 1000 mg ) on Wednesday, Saturday and Sunday 120 capsule 3  . ipratropium-albuterol (DUONEB) 0.5-2.5 (3) MG/3ML SOLN Take 3 mLs by nebulization every 6 (six) hours as needed. (Patient taking differently: Take 3 mLs by nebulization every 6 (six) hours as needed (shortness of breath). ) 360 mL 0  . lidocaine (LIDODERM) 5 % Place 1 patch daily onto the skin. Remove & Discard patch within 12 hours or as  directed by MD (Patient taking differently: Place 1 patch onto the skin daily as needed (pain). Remove & Discard patch within 12 hours or as directed by MD) 15 patch 0  . methimazole (TAPAZOLE) 5 MG tablet TAKE 1 TABLET 2 TIMES A WEEK 8 tablet 4  . mupirocin cream (BACTROBAN) 2 % Apply 1 application topically 2 (two) times daily. 30 g 5  . NONFORMULARY OR COMPOUNDED ITEM Topical cream with equal parts diclofenac, gabapentin, lidocaine, menthol  Disp 100 gm 1 each 5  . Oral Hygiene Products (Q-CARE COVERD [place]/SUCTION) MISC Use as directed 10 each 5  . OXYGEN 2lpm 24/7  DME- [facility]    . polyethylene glycol (MIRALAX / GLYCOLAX) packet TAKE 17G BY MOUTH TWICE A DAY (Patient taking differently: Take 17 g by mouth 2 (two) times daily as needed for mild constipation. ) 14 packet 1  . pregabalin (LYRICA) 50 MG capsule TAKE 1 CAPSULE (50 MG TOTAL) BY MOUTH 3 (THREE) TIMES DAILY. 90 capsule 5   . Probiotic Product (ALIGN) 4 MG CAPS Take 4 mg by mouth daily.     . propranolol (INDERAL) 10 MG tablet TAKE 0.5 TABLETS (5 MG TOTAL) BY MOUTH 2 (TWO) TIMES DAILY. 60 tablet 2  . PROTONIX 40 MG PACK TAKE 20 MLS (40 MG TOTAL) BY MOUTH DAILY. 30 each 3  . terconazole (TERAZOL 7) 0.4 % vaginal cream Place 1 applicator vaginally at bedtime. (Patient taking differently: Place 1 applicator vaginally at bedtime as needed (irritation). ) 90 g 3  . traMADol (ULTRAM) 50 MG tablet TAKE 2 TABLETS BY MOUTH 3 TIMES A DAY AS NEEDED 180 tablet 1  . traZODone (DESYREL) 50 MG tablet TAKE 1 TABLET (50 MG TOTAL) BY MOUTH AT BEDTIME AS NEEDED. FOR SLEEP 90 tablet 1  . warfarin (COUMADIN) 4 MG tablet Take 1 tablet daily except 1/2 on Sunday and thursdays or Take as directed by anticoagulation clinic.  90 day 90 tablet 1   No current facility-administered medications on file prior to visit.     Past Medical History:  Diagnosis Date  . ARTHRITIS   . Arthritis   . Diverticulosis   . Essential thrombocytosis ([place])   . HYPERTENSION   . HYPERTHYROIDISM   . Hyperthyroidism    s/p I-131 ablation 03/2011 of multinod goiter  . INSOMNIA   . MIGRAINE HEADACHE   . OBESITY   . Posttraumatic stress disorder   . Pulmonary embolism ([place]) 03/2014   with DVT  . Stroke [facility]) 03/2014   dysarthria    Past Surgical History:  Procedure Laterality Date  . ABDOMINAL HYSTERECTOMY  1976  . BREAST SURGERY     biopsy  . RADIOLOGY WITH ANESTHESIA Left 03/08/2014   Procedure: RADIOLOGY WITH ANESTHESIA;  Surgeon: Rob Hickman, MD;  Location: [facility];  Service: Radiology;  Laterality: Left;  . TONSILLECTOMY    . TOTAL HIP ARTHROPLASTY  1998    right    Social History   Socioeconomic History  . Marital status: Widowed    Spouse name: Not on file  . Number of children: 2  . Years of education: 70  . Highest education level: Not on file  Occupational History  . Occupation: retired    Comment: Restaurant manager, fast food  Social  Needs  . Financial resource strain: Not on file  . Food insecurity    Worry: Not on file    Inability: Not on file  . Transportation needs    Medical: Not on file  Non-medical: Not on file  Tobacco Use  . Smoking status: Never Smoker  . Smokeless tobacco: Never Used  Substance and Sexual Activity  . Alcohol use: No    Alcohol/week: 0.0 standard drinks  . Drug use: No  . Sexual activity: Never  Lifestyle  . Physical activity    Days per week: Not on file    Minutes per session: Not on file  . Stress: Not on file  Relationships  . Social Herbalist on phone: Not on file    Gets together: Not on file    Attends religious service: Not on file    Active member of club or organization: Not on file    Attends meetings of clubs or organizations: Not on file    Relationship status: Not on file  Other Topics Concern  . Not on file  Social History Narrative   Widowed, lived alone prior to CVA 03/2014   SNF at [facility], then home 07/2014 with dtr   Right handed   Caffeine use- occasionally drinks tea    Family History  Problem Relation Age of Onset  . Asthma Mother   . Asthma Father   . Prostate cancer Father   . Stroke Brother   . Breast cancer Sister   . Stomach cancer Sister   . Thyroid disease Neg Hx     Review of Systems  Unable to perform ROS: Patient nonverbal       Objective:   Vitals:   08/05/18 1321  BP: 116/68  Pulse: 82  Resp: 16  SpO2: 92%   BP Readings from Last 3 Encounters:  08/05/18 116/68  05/24/18 115/79  03/11/18 111/61   Wt Readings from Last 3 Encounters:  05/24/18 175 lb (79.4 kg)  01/24/18 178 lb (80.7 kg)  11/23/17 178 lb (80.7 kg)   There is no height or weight on file to calculate BMI.   Physical Exam    Constitutional: Appears well-developed and well-nourished. No distress.  Patient is aphasic. HENT:  Head: Normocephalic and atraumatic.  Neck: Neck supple. No tracheal deviation present. No thyromegaly  present.  No cervical lymphadenopathy Cardiovascular: Normal rate, regular rhythm and normal heart sounds.  2/6 systolic murmur heard.  Left upper extremity edema-chronic.  Bilateral lower extremity pitting edema-right lower extremity much worse than left-stable Pulmonary/Chest: Poor effort,  breath sounds normal. No respiratory distress. No has no wheezes. No rales. Abdomen: Soft, nontender, nondistended MSK: Left upper extremity: Significant swelling of the left hand with diffuse bruising that extends up to mid forearm.  Significant swelling from the wrist down to fingertips.  Hand is warm.  Palpable radial and ulnar pulses.  Also has posterior left hand covered with bandage and not viewed. Skin: Skin is warm and dry. Not diaphoretic. Unable to view ulcers Psychiatric: Normal mood and affect. Behavior is normal.    Pertinent Lab Testing: Recent Labs  Lab 07/31/18 0824  NA 138  CL 108  CO2 23  BUN 13  CREATININE 0.4  GLUCOSE 117  CALCIUM 8.0*   Recent Labs  Lab 07/30/18 1539 07/31/18 0824  WBC 14.1* 14.2*  HGB 11.1* 11.0*  HCT 34.0* 33.2*  PLT 567* 554*   Assessment & Plan:    See Problem List for Assessment and Plan of chronic medical problems.

## 2018-08-05 ENCOUNTER — Ambulatory Visit (INDEPENDENT_AMBULATORY_CARE_PROVIDER_SITE_OTHER): Payer: Medicare Other | Admitting: General Practice

## 2018-08-05 ENCOUNTER — Other Ambulatory Visit: Payer: Self-pay

## 2018-08-05 ENCOUNTER — Ambulatory Visit (INDEPENDENT_AMBULATORY_CARE_PROVIDER_SITE_OTHER): Payer: Medicare Other | Admitting: Internal Medicine

## 2018-08-05 ENCOUNTER — Other Ambulatory Visit (INDEPENDENT_AMBULATORY_CARE_PROVIDER_SITE_OTHER): Payer: Medicare Other

## 2018-08-05 ENCOUNTER — Encounter: Payer: Self-pay | Admitting: Internal Medicine

## 2018-08-05 ENCOUNTER — Telehealth: Payer: Self-pay | Admitting: Internal Medicine

## 2018-08-05 VITALS — BP 116/68 | HR 82 | Temp 98.7°F | Resp 16

## 2018-08-05 DIAGNOSIS — S40022A Contusion of left upper arm, initial encounter: Secondary | ICD-10-CM

## 2018-08-05 DIAGNOSIS — Z7901 Long term (current) use of anticoagulants: Secondary | ICD-10-CM | POA: Diagnosis not present

## 2018-08-05 DIAGNOSIS — E042 Nontoxic multinodular goiter: Secondary | ICD-10-CM | POA: Diagnosis not present

## 2018-08-05 DIAGNOSIS — L8990 Pressure ulcer of unspecified site, unspecified stage: Secondary | ICD-10-CM | POA: Diagnosis not present

## 2018-08-05 DIAGNOSIS — E0590 Thyrotoxicosis, unspecified without thyrotoxic crisis or storm: Secondary | ICD-10-CM | POA: Diagnosis not present

## 2018-08-05 LAB — POCT INR: INR: 2.3 (ref 2.0–3.0)

## 2018-08-05 LAB — TSH: TSH: 1.43 u[IU]/mL (ref 0.35–4.50)

## 2018-08-05 LAB — T4, FREE: Free T4: 1.69 ng/dL — ABNORMAL HIGH (ref 0.60–1.60)

## 2018-08-05 LAB — T3, FREE: T3, Free: 2.9 pg/mL (ref 2.3–4.2)

## 2018-08-05 NOTE — Patient Instructions (Addendum)
  Tests ordered today. Your results will be released to [place] (or called to you) after review.  If any changes need to be made, you will be notified at that same time.   Medications reviewed and updated.  Changes include :   None   Elevated the left hand as much as possible.    Let me know about the ulcers so we can order the pad.

## 2018-08-05 NOTE — Assessment & Plan Note (Addendum)
Several pressure ulcers sacral region She needs a low air loss mattress given conservative measures are not effective and she has gotten additional pressure ulcers-this is medically necessary-see note Continue home health nursing

## 2018-08-05 NOTE — Telephone Encounter (Signed)
Cathy with [facility] states you had asked them to to pt inr on Tues. But they are seeing pt Sandy Barrett & Friday for wound care. Would like to do the pt inr on Monday if OK with you. Please call back  cb [phone]

## 2018-08-05 NOTE — Assessment & Plan Note (Signed)
07/29/2018-embolization Following with endocrine

## 2018-08-05 NOTE — Progress Notes (Signed)
Agree with management.  Laylia Mui J Festus Pursel, MD  

## 2018-08-05 NOTE — Assessment & Plan Note (Signed)
Had embolization Needs TFTs-we will have drawn at lab today and send endocrine Management of medication per endocrine

## 2018-08-05 NOTE — Assessment & Plan Note (Signed)
Hematoma secondary to embolization that went to left radial artery on 7/24 Was seen by plastics-there is no evidence of compartment syndrome Swelling of left hand worse than yesterday per daughter Hand is warm and radial and ulnar pulses palpable-no evidence of compartment syndrome Stressed the importance of elevating hand-her daughter will work on this harder-stress this will significantly improve swelling Discussed that bruising will take several weeks to resolve She is back on anticoagulation-seen by our Coumadin nurse here today

## 2018-08-05 NOTE — Assessment & Plan Note (Addendum)
INR 2.3-seen by our Coumadin nurse here today and instructed on dosing

## 2018-08-05 NOTE — Patient Instructions (Signed)
Pre visit review using our clinic review tool, if applicable. No additional management support is needed unless otherwise documented below in the visit note.  Please continue to take 1/2 tablet daily except 1 tablet on Mon Wed and Fridays.  Instructions given to patient's daughter while seeing Dr. Quay Burow today.  Home health RN, Juliann Pulse @ [facility] will check INR on Wednesday of next week.  Stop Lovenox!

## 2018-08-06 ENCOUNTER — Encounter: Payer: Self-pay | Admitting: Internal Medicine

## 2018-08-06 DIAGNOSIS — J9611 Chronic respiratory failure with hypoxia: Secondary | ICD-10-CM | POA: Diagnosis not present

## 2018-08-06 DIAGNOSIS — L89150 Pressure ulcer of sacral region, unstageable: Secondary | ICD-10-CM | POA: Diagnosis not present

## 2018-08-06 DIAGNOSIS — D473 Essential (hemorrhagic) thrombocythemia: Secondary | ICD-10-CM | POA: Diagnosis not present

## 2018-08-06 DIAGNOSIS — I69351 Hemiplegia and hemiparesis following cerebral infarction affecting right dominant side: Secondary | ICD-10-CM | POA: Diagnosis not present

## 2018-08-06 DIAGNOSIS — Z4800 Encounter for change or removal of nonsurgical wound dressing: Secondary | ICD-10-CM | POA: Diagnosis not present

## 2018-08-06 DIAGNOSIS — G629 Polyneuropathy, unspecified: Secondary | ICD-10-CM | POA: Diagnosis not present

## 2018-08-08 ENCOUNTER — Encounter: Payer: Self-pay | Admitting: Internal Medicine

## 2018-08-08 ENCOUNTER — Inpatient Hospital Stay: Payer: Medicare Other

## 2018-08-08 ENCOUNTER — Other Ambulatory Visit: Payer: Self-pay | Admitting: Internal Medicine

## 2018-08-08 ENCOUNTER — Ambulatory Visit: Payer: Medicare Other | Admitting: Hematology and Oncology

## 2018-08-08 DIAGNOSIS — Z7401 Bed confinement status: Secondary | ICD-10-CM | POA: Diagnosis not present

## 2018-08-08 DIAGNOSIS — G629 Polyneuropathy, unspecified: Secondary | ICD-10-CM | POA: Diagnosis not present

## 2018-08-08 DIAGNOSIS — Z87440 Personal history of urinary (tract) infections: Secondary | ICD-10-CM | POA: Diagnosis not present

## 2018-08-08 DIAGNOSIS — T81718D Complication of other artery following a procedure, not elsewhere classified, subsequent encounter: Secondary | ICD-10-CM | POA: Diagnosis not present

## 2018-08-08 DIAGNOSIS — I69390 Apraxia following cerebral infarction: Secondary | ICD-10-CM | POA: Diagnosis not present

## 2018-08-08 DIAGNOSIS — Z9981 Dependence on supplemental oxygen: Secondary | ICD-10-CM | POA: Diagnosis not present

## 2018-08-08 DIAGNOSIS — I69351 Hemiplegia and hemiparesis following cerebral infarction affecting right dominant side: Secondary | ICD-10-CM | POA: Diagnosis not present

## 2018-08-08 DIAGNOSIS — L7632 Postprocedural hematoma of skin and subcutaneous tissue following other procedure: Secondary | ICD-10-CM | POA: Diagnosis not present

## 2018-08-08 DIAGNOSIS — Z86718 Personal history of other venous thrombosis and embolism: Secondary | ICD-10-CM | POA: Diagnosis not present

## 2018-08-08 DIAGNOSIS — I69320 Aphasia following cerebral infarction: Secondary | ICD-10-CM | POA: Diagnosis not present

## 2018-08-08 DIAGNOSIS — J9611 Chronic respiratory failure with hypoxia: Secondary | ICD-10-CM | POA: Diagnosis not present

## 2018-08-08 DIAGNOSIS — L89312 Pressure ulcer of right buttock, stage 2: Secondary | ICD-10-CM | POA: Diagnosis not present

## 2018-08-08 DIAGNOSIS — Z4800 Encounter for change or removal of nonsurgical wound dressing: Secondary | ICD-10-CM | POA: Diagnosis not present

## 2018-08-08 DIAGNOSIS — L89150 Pressure ulcer of sacral region, unstageable: Secondary | ICD-10-CM | POA: Diagnosis not present

## 2018-08-08 DIAGNOSIS — L89323 Pressure ulcer of left buttock, stage 3: Secondary | ICD-10-CM | POA: Diagnosis not present

## 2018-08-08 DIAGNOSIS — G43909 Migraine, unspecified, not intractable, without status migrainosus: Secondary | ICD-10-CM | POA: Diagnosis not present

## 2018-08-08 DIAGNOSIS — H903 Sensorineural hearing loss, bilateral: Secondary | ICD-10-CM | POA: Diagnosis not present

## 2018-08-08 DIAGNOSIS — E0520 Thyrotoxicosis with toxic multinodular goiter without thyrotoxic crisis or storm: Secondary | ICD-10-CM | POA: Diagnosis not present

## 2018-08-08 DIAGNOSIS — L89322 Pressure ulcer of left buttock, stage 2: Secondary | ICD-10-CM | POA: Diagnosis not present

## 2018-08-08 DIAGNOSIS — I69391 Dysphagia following cerebral infarction: Secondary | ICD-10-CM | POA: Diagnosis not present

## 2018-08-08 DIAGNOSIS — M150 Primary generalized (osteo)arthritis: Secondary | ICD-10-CM | POA: Diagnosis not present

## 2018-08-08 DIAGNOSIS — Z86711 Personal history of pulmonary embolism: Secondary | ICD-10-CM | POA: Diagnosis not present

## 2018-08-08 DIAGNOSIS — Z7901 Long term (current) use of anticoagulants: Secondary | ICD-10-CM | POA: Diagnosis not present

## 2018-08-08 DIAGNOSIS — R1310 Dysphagia, unspecified: Secondary | ICD-10-CM | POA: Diagnosis not present

## 2018-08-08 DIAGNOSIS — D473 Essential (hemorrhagic) thrombocythemia: Secondary | ICD-10-CM | POA: Diagnosis not present

## 2018-08-10 ENCOUNTER — Telehealth: Payer: Self-pay | Admitting: Internal Medicine

## 2018-08-10 ENCOUNTER — Other Ambulatory Visit: Payer: Self-pay | Admitting: Internal Medicine

## 2018-08-10 ENCOUNTER — Ambulatory Visit (INDEPENDENT_AMBULATORY_CARE_PROVIDER_SITE_OTHER): Payer: Medicare Other | Admitting: General Practice

## 2018-08-10 DIAGNOSIS — Z7901 Long term (current) use of anticoagulants: Secondary | ICD-10-CM

## 2018-08-10 DIAGNOSIS — I82403 Acute embolism and thrombosis of unspecified deep veins of lower extremity, bilateral: Secondary | ICD-10-CM | POA: Diagnosis not present

## 2018-08-10 DIAGNOSIS — L89312 Pressure ulcer of right buttock, stage 2: Secondary | ICD-10-CM | POA: Diagnosis not present

## 2018-08-10 DIAGNOSIS — L89322 Pressure ulcer of left buttock, stage 2: Secondary | ICD-10-CM | POA: Diagnosis not present

## 2018-08-10 DIAGNOSIS — I2699 Other pulmonary embolism without acute cor pulmonale: Secondary | ICD-10-CM | POA: Diagnosis not present

## 2018-08-10 DIAGNOSIS — L89150 Pressure ulcer of sacral region, unstageable: Secondary | ICD-10-CM | POA: Diagnosis not present

## 2018-08-10 DIAGNOSIS — E049 Nontoxic goiter, unspecified: Secondary | ICD-10-CM | POA: Diagnosis not present

## 2018-08-10 DIAGNOSIS — L7632 Postprocedural hematoma of skin and subcutaneous tissue following other procedure: Secondary | ICD-10-CM | POA: Diagnosis not present

## 2018-08-10 DIAGNOSIS — L89323 Pressure ulcer of left buttock, stage 3: Secondary | ICD-10-CM | POA: Diagnosis not present

## 2018-08-10 DIAGNOSIS — I63311 Cerebral infarction due to thrombosis of right middle cerebral artery: Secondary | ICD-10-CM | POA: Diagnosis not present

## 2018-08-10 DIAGNOSIS — T81718D Complication of other artery following a procedure, not elsewhere classified, subsequent encounter: Secondary | ICD-10-CM | POA: Diagnosis not present

## 2018-08-10 LAB — POCT INR: INR: 2 (ref 2.0–3.0)

## 2018-08-10 NOTE — Telephone Encounter (Signed)
Cathy with [facility] calling to give results: PT  - 23.5     INR --2.0. Cathy's contact number [phone].

## 2018-08-10 NOTE — Telephone Encounter (Signed)
See anticoagulation note by [name]

## 2018-08-10 NOTE — Patient Instructions (Signed)
Pre visit review using our clinic review tool, if applicable. No additional management support is needed unless otherwise documented below in the visit note.  Please continue to take 1/2 tablet daily except 1 tablet on Mon Wed and Fridays. Dosing instructions given to  [facility] [phone].  Re-check in 2 weeks.

## 2018-08-11 NOTE — Telephone Encounter (Signed)
Last OV 08/05/18 Next OV Not Scheduled  [place] Controlled Substance Database checked. Last filled on 07/10/18

## 2018-08-12 DIAGNOSIS — L89323 Pressure ulcer of left buttock, stage 3: Secondary | ICD-10-CM | POA: Diagnosis not present

## 2018-08-12 DIAGNOSIS — L89150 Pressure ulcer of sacral region, unstageable: Secondary | ICD-10-CM | POA: Diagnosis not present

## 2018-08-12 DIAGNOSIS — T81718D Complication of other artery following a procedure, not elsewhere classified, subsequent encounter: Secondary | ICD-10-CM | POA: Diagnosis not present

## 2018-08-12 DIAGNOSIS — L89322 Pressure ulcer of left buttock, stage 2: Secondary | ICD-10-CM | POA: Diagnosis not present

## 2018-08-12 DIAGNOSIS — L89312 Pressure ulcer of right buttock, stage 2: Secondary | ICD-10-CM | POA: Diagnosis not present

## 2018-08-12 DIAGNOSIS — L7632 Postprocedural hematoma of skin and subcutaneous tissue following other procedure: Secondary | ICD-10-CM | POA: Diagnosis not present

## 2018-08-15 DIAGNOSIS — I69390 Apraxia following cerebral infarction: Secondary | ICD-10-CM

## 2018-08-15 DIAGNOSIS — J9611 Chronic respiratory failure with hypoxia: Secondary | ICD-10-CM | POA: Diagnosis not present

## 2018-08-15 DIAGNOSIS — G43909 Migraine, unspecified, not intractable, without status migrainosus: Secondary | ICD-10-CM

## 2018-08-15 DIAGNOSIS — D473 Essential (hemorrhagic) thrombocythemia: Secondary | ICD-10-CM | POA: Diagnosis not present

## 2018-08-15 DIAGNOSIS — I69320 Aphasia following cerebral infarction: Secondary | ICD-10-CM

## 2018-08-15 DIAGNOSIS — I69351 Hemiplegia and hemiparesis following cerebral infarction affecting right dominant side: Secondary | ICD-10-CM | POA: Diagnosis not present

## 2018-08-15 DIAGNOSIS — Z9981 Dependence on supplemental oxygen: Secondary | ICD-10-CM

## 2018-08-15 DIAGNOSIS — E0520 Thyrotoxicosis with toxic multinodular goiter without thyrotoxic crisis or storm: Secondary | ICD-10-CM | POA: Diagnosis not present

## 2018-08-15 DIAGNOSIS — Z86711 Personal history of pulmonary embolism: Secondary | ICD-10-CM

## 2018-08-15 DIAGNOSIS — L89322 Pressure ulcer of left buttock, stage 2: Secondary | ICD-10-CM | POA: Diagnosis not present

## 2018-08-15 DIAGNOSIS — I69391 Dysphagia following cerebral infarction: Secondary | ICD-10-CM

## 2018-08-15 DIAGNOSIS — L89323 Pressure ulcer of left buttock, stage 3: Secondary | ICD-10-CM | POA: Diagnosis not present

## 2018-08-15 DIAGNOSIS — L89150 Pressure ulcer of sacral region, unstageable: Secondary | ICD-10-CM | POA: Diagnosis not present

## 2018-08-15 DIAGNOSIS — Z7401 Bed confinement status: Secondary | ICD-10-CM

## 2018-08-15 DIAGNOSIS — H903 Sensorineural hearing loss, bilateral: Secondary | ICD-10-CM

## 2018-08-15 DIAGNOSIS — M150 Primary generalized (osteo)arthritis: Secondary | ICD-10-CM

## 2018-08-15 DIAGNOSIS — T81718D Complication of other artery following a procedure, not elsewhere classified, subsequent encounter: Secondary | ICD-10-CM | POA: Diagnosis not present

## 2018-08-15 DIAGNOSIS — Z87440 Personal history of urinary (tract) infections: Secondary | ICD-10-CM

## 2018-08-15 DIAGNOSIS — L89312 Pressure ulcer of right buttock, stage 2: Secondary | ICD-10-CM | POA: Diagnosis not present

## 2018-08-15 DIAGNOSIS — R1310 Dysphagia, unspecified: Secondary | ICD-10-CM

## 2018-08-15 DIAGNOSIS — Z86718 Personal history of other venous thrombosis and embolism: Secondary | ICD-10-CM

## 2018-08-15 DIAGNOSIS — Z4800 Encounter for change or removal of nonsurgical wound dressing: Secondary | ICD-10-CM | POA: Diagnosis not present

## 2018-08-15 DIAGNOSIS — G629 Polyneuropathy, unspecified: Secondary | ICD-10-CM | POA: Diagnosis not present

## 2018-08-15 DIAGNOSIS — L7632 Postprocedural hematoma of skin and subcutaneous tissue following other procedure: Secondary | ICD-10-CM | POA: Diagnosis not present

## 2018-08-15 DIAGNOSIS — Z7901 Long term (current) use of anticoagulants: Secondary | ICD-10-CM

## 2018-08-17 DIAGNOSIS — L89322 Pressure ulcer of left buttock, stage 2: Secondary | ICD-10-CM | POA: Diagnosis not present

## 2018-08-17 DIAGNOSIS — T81718D Complication of other artery following a procedure, not elsewhere classified, subsequent encounter: Secondary | ICD-10-CM | POA: Diagnosis not present

## 2018-08-17 DIAGNOSIS — L89312 Pressure ulcer of right buttock, stage 2: Secondary | ICD-10-CM | POA: Diagnosis not present

## 2018-08-17 DIAGNOSIS — L89323 Pressure ulcer of left buttock, stage 3: Secondary | ICD-10-CM | POA: Diagnosis not present

## 2018-08-17 DIAGNOSIS — L7632 Postprocedural hematoma of skin and subcutaneous tissue following other procedure: Secondary | ICD-10-CM | POA: Diagnosis not present

## 2018-08-17 DIAGNOSIS — L89150 Pressure ulcer of sacral region, unstageable: Secondary | ICD-10-CM | POA: Diagnosis not present

## 2018-08-18 ENCOUNTER — Other Ambulatory Visit: Payer: Self-pay | Admitting: Internal Medicine

## 2018-08-18 ENCOUNTER — Other Ambulatory Visit: Payer: Self-pay

## 2018-08-18 ENCOUNTER — Encounter (HOSPITAL_BASED_OUTPATIENT_CLINIC_OR_DEPARTMENT_OTHER): Payer: Medicare Other | Attending: Internal Medicine

## 2018-08-18 DIAGNOSIS — L89313 Pressure ulcer of right buttock, stage 3: Secondary | ICD-10-CM | POA: Diagnosis not present

## 2018-08-18 DIAGNOSIS — L89326 Pressure-induced deep tissue damage of left buttock: Secondary | ICD-10-CM | POA: Insufficient documentation

## 2018-08-18 DIAGNOSIS — Z86718 Personal history of other venous thrombosis and embolism: Secondary | ICD-10-CM | POA: Insufficient documentation

## 2018-08-18 DIAGNOSIS — I69320 Aphasia following cerebral infarction: Secondary | ICD-10-CM | POA: Insufficient documentation

## 2018-08-18 DIAGNOSIS — L89316 Pressure-induced deep tissue damage of right buttock: Secondary | ICD-10-CM | POA: Insufficient documentation

## 2018-08-18 DIAGNOSIS — I69351 Hemiplegia and hemiparesis following cerebral infarction affecting right dominant side: Secondary | ICD-10-CM | POA: Diagnosis not present

## 2018-08-18 DIAGNOSIS — Z993 Dependence on wheelchair: Secondary | ICD-10-CM | POA: Diagnosis not present

## 2018-08-18 DIAGNOSIS — L89329 Pressure ulcer of left buttock, unspecified stage: Secondary | ICD-10-CM | POA: Diagnosis not present

## 2018-08-19 ENCOUNTER — Telehealth: Payer: Self-pay | Admitting: Internal Medicine

## 2018-08-19 ENCOUNTER — Encounter: Payer: Self-pay | Admitting: Pulmonary Disease

## 2018-08-19 ENCOUNTER — Ambulatory Visit (INDEPENDENT_AMBULATORY_CARE_PROVIDER_SITE_OTHER): Payer: Medicare Other | Admitting: Pulmonary Disease

## 2018-08-19 VITALS — BP 116/74 | HR 62 | Temp 97.4°F | Ht 60.0 in

## 2018-08-19 DIAGNOSIS — L89322 Pressure ulcer of left buttock, stage 2: Secondary | ICD-10-CM | POA: Diagnosis not present

## 2018-08-19 DIAGNOSIS — J690 Pneumonitis due to inhalation of food and vomit: Secondary | ICD-10-CM

## 2018-08-19 DIAGNOSIS — R1311 Dysphagia, oral phase: Secondary | ICD-10-CM | POA: Diagnosis not present

## 2018-08-19 DIAGNOSIS — L89312 Pressure ulcer of right buttock, stage 2: Secondary | ICD-10-CM | POA: Diagnosis not present

## 2018-08-19 DIAGNOSIS — T81718D Complication of other artery following a procedure, not elsewhere classified, subsequent encounter: Secondary | ICD-10-CM | POA: Diagnosis not present

## 2018-08-19 DIAGNOSIS — L7632 Postprocedural hematoma of skin and subcutaneous tissue following other procedure: Secondary | ICD-10-CM | POA: Diagnosis not present

## 2018-08-19 DIAGNOSIS — J9611 Chronic respiratory failure with hypoxia: Secondary | ICD-10-CM | POA: Diagnosis not present

## 2018-08-19 DIAGNOSIS — L89150 Pressure ulcer of sacral region, unstageable: Secondary | ICD-10-CM | POA: Diagnosis not present

## 2018-08-19 DIAGNOSIS — L89323 Pressure ulcer of left buttock, stage 3: Secondary | ICD-10-CM | POA: Diagnosis not present

## 2018-08-19 NOTE — Assessment & Plan Note (Addendum)
History of  Plan: We will continue to monitor clinically Continue to follow dysphagia 2 diet If symptoms worsen please contact our office so we can evaluate as well as complete chest x-ray

## 2018-08-19 NOTE — Assessment & Plan Note (Signed)
Plan: °Continue dysphagia 2 diet °Continue to monitor for aspiration °

## 2018-08-19 NOTE — Assessment & Plan Note (Signed)
Plan: We will order overnight oximetry Continue oxygen therapy as prescribed Follow-up with our office in 3 months

## 2018-08-19 NOTE — Progress Notes (Signed)
@Patient  ID: Sandy Barrett, female    DOB: 1932/06/04, 85 y.o.   MRN: [id number]  Chief Complaint  Patient presents with  . Follow-up    Per pt's daughter, she was advised to bring patient in to be reevaluated for O2 at night. Does not use O2 during the day.     Referring provider: [name], MD  HPI:  85 year old female never smoker followed in our office for chronic respiratory failure, nocturnal hypoxemia.  Patient initially consulted with our practice after a case of aspiration pneumonia in 2019.  PMH: CVA, hypothyroidism, IBS, wheelchair-bound, history of PE Smoker/ Smoking History: Never smoker Maintenance: None Pt of: Dr. Melvyn Novas  08/19/2018  - Visit   85 year old female never smoker presenting to our office today as they were told by the DME company that she needs to be requalified for nocturnal O2.  Patient is maintained on 2 L of O2 based off of 2019 overnight oximetry.  Patient daughter with patient today.  Patient's daughter reporting no acute or worsened symptoms today.  Patient continues to be on dysphagia 2 diet.  No concerns as far as worsened swallowing or concerns for aspiration.   Tests:   02/13/2017-echocardiogram-LV ejection fraction 60 to 65%, PA P pressure 45  ONO rec RA  03/31/17  desat < 89% x 10h 39 min rec 2lpm hs  ONO on 2lpm (done by [facility] on 04/23/17) was normal  Needs to continue using her 2lpm o2 with sleep   FENO:  No results found for: NITRICOXIDE  PFT: No flowsheet data found.  Imaging: No results found.    Specialty Problems      Pulmonary Problems   Tracheal deviation    CT 05/2015 shows trachea deviation from multinodular goiter.       Aspiration pneumonia ([place])   Hemidiaphragm paralysis   Chronic respiratory failure with hypoxia (HCC)    sats 97% RA at rest 03/11/2017 p admit for asp pna 02/12/17  - ONO rec RA  03/31/17  desat < 89% x 10h 39 min rec 2lpm hs and repeat ono on 2lpm > done 04/24/17 ok          Allergies   Allergen Reactions  . Valtrex [Valacyclovir Hcl] Cough    Cough and gagging    Immunization History  Administered Date(s) Administered  . Influenza Split 10/28/2010  . Influenza, High Dose Seasonal PF 11/26/2014, 09/09/2016  . Influenza, Seasonal, Injecte, Preservative Fre 12/24/2011  . Influenza,inj,Quad PF,6+ Mos 08/31/2012, 10/25/2015  . Pneumococcal Conjugate-13 11/26/2014  . Pneumococcal Polysaccharide-23 07/22/2010  . Tdap 07/22/2010    Past Medical History:  Diagnosis Date  . ARTHRITIS   . Arthritis   . Diverticulosis   . Essential thrombocytosis ([place])   . HYPERTENSION   . HYPERTHYROIDISM   . Hyperthyroidism    s/p I-131 ablation 03/2011 of multinod goiter  . INSOMNIA   . MIGRAINE HEADACHE   . OBESITY   . Posttraumatic stress disorder   . Pulmonary embolism ([place]) 03/2014   with DVT  . Stroke [facility]) 03/2014   dysarthria    Tobacco History: Social History   Tobacco Use  Smoking Status Never Smoker  Smokeless Tobacco Never Used   Counseling given: Not Answered   Continue to not smoke  Outpatient Encounter Medications as of 08/19/2018  Medication Sig  . acetaminophen (TYLENOL) 160 MG/5ML liquid Take 480 mg by mouth every 4 (four) hours as needed for fever or pain.  . calamine lotion Apply 1 application as  needed topically for itching.  . chlorhexidine (PERIDEX) 0.12 % solution RINSE TWICE A DAY FOR 1 MINUTE  . ciclopirox (PENLAC) 8 % solution Apply topically at bedtime. Apply over nail & surrounding skin. Apply Qd over previous coat. After 7 days, remove w alcohol, continue cycle.  . diclofenac sodium (VOLTAREN) 1 % GEL APPLY 2 G TOPICALLY 3 (THREE) TIMES DAILY AS NEEDED.  . hydroxyurea (HYDREA) 500 MG capsule Take 1 tablet (500 mg) daily except 2 tablets ( 1000 mg ) on Wednesday, Saturday and Sunday  . ipratropium-albuterol (DUONEB) 0.5-2.5 (3) MG/3ML SOLN Take 3 mLs by nebulization every 6 (six) hours as needed. (Patient taking differently: Take 3 mLs by  nebulization every 6 (six) hours as needed (shortness of breath). )  . lidocaine (LIDODERM) 5 % Place 1 patch daily onto the skin. Remove & Discard patch within 12 hours or as directed by MD (Patient taking differently: Place 1 patch onto the skin daily as needed (pain). Remove & Discard patch within 12 hours or as directed by MD)  . methimazole (TAPAZOLE) 5 MG tablet TAKE 1 TABLET 2 TIMES A WEEK  . mupirocin cream (BACTROBAN) 2 % Apply 1 application topically 2 (two) times daily.  . NONFORMULARY OR COMPOUNDED ITEM Topical cream with equal parts diclofenac, gabapentin, lidocaine, menthol  Disp 100 gm  . Oral Hygiene Products (Q-CARE COVERD YANKAUER/SUCTION) MISC Use as directed  . OXYGEN 2lpm 24/7  DME- [facility]  . pantoprazole sodium (PROTONIX) 40 mg/20 mL PACK Take 20 mLs (40 mg total) by mouth daily.  . polyethylene glycol (MIRALAX / GLYCOLAX) packet TAKE 17G BY MOUTH TWICE A DAY (Patient taking differently: Take 17 g by mouth 2 (two) times daily as needed for mild constipation. )  . pregabalin (LYRICA) 50 MG capsule TAKE 1 CAPSULE (50 MG TOTAL) BY MOUTH 3 (THREE) TIMES DAILY.  . Probiotic Product (ALIGN) 4 MG CAPS Take 4 mg by mouth daily.   . propranolol (INDERAL) 10 MG tablet TAKE 0.5 TABLETS (5 MG TOTAL) BY MOUTH 2 (TWO) TIMES DAILY.  [name] terconazole (TERAZOL 7) 0.4 % vaginal cream Place 1 applicator vaginally at bedtime. (Patient taking differently: Place 1 applicator vaginally at bedtime as needed (irritation). )  . traMADol (ULTRAM) 50 MG tablet TAKE 2 TABLETS BY MOUTH 3 TIMES A DAY AS NEEDED  . traZODone (DESYREL) 50 MG tablet TAKE 1 TABLET (50 MG TOTAL) BY MOUTH AT BEDTIME AS NEEDED. FOR SLEEP  . warfarin (COUMADIN) 4 MG tablet Take 1 tablet daily except 1/2 on Sunday and thursdays or Take as directed by anticoagulation clinic.  90 day   No facility-administered encounter medications on file as of 08/19/2018.      Review of Systems  Review of Systems  Constitutional: Negative for  activity change, fatigue and fever.  HENT: Positive for trouble swallowing (Risk of aspiration). Negative for congestion and sore throat.   Respiratory: Negative for cough, shortness of breath and wheezing.   Cardiovascular: Negative for chest pain and palpitations.  Gastrointestinal: Negative for diarrhea, nausea and vomiting.  Musculoskeletal: Negative for arthralgias.  Neurological: Negative for dizziness.  Psychiatric/Behavioral: Negative for sleep disturbance. The patient is not nervous/anxious.      Physical Exam  BP 116/74   Pulse 62   Temp (!) 97.4 F (36.3 C) (Oral)   Ht 5' (1.524 m)   SpO2 96%   BMI 34.18 kg/m   Wt Readings from Last 5 Encounters:  05/24/18 175 lb (79.4 kg)  01/24/18 178 lb (80.7 kg)  11/23/17 178 lb (80.7 kg)  10/05/17 175 lb (79.4 kg)  04/20/17 176 lb (79.8 kg)     Physical Exam Vitals signs and nursing note reviewed. Exam conducted with a chaperone present.  Constitutional:      General: She is not in acute distress.    Comments: Chronically ill elderly female  HENT:     Head: Normocephalic and atraumatic.     Right Ear: Tympanic membrane, ear canal and external ear normal. There is no impacted cerumen.     Left Ear: Tympanic membrane, ear canal and external ear normal. There is no impacted cerumen.     Nose: Nose normal. No congestion.     Mouth/Throat:     Mouth: Mucous membranes are moist.     Pharynx: Oropharynx is clear.  Eyes:     Pupils: Pupils are equal, round, and reactive to light.  Cardiovascular:     Rate and Rhythm: Normal rate and regular rhythm.     Pulses: Normal pulses.     Heart sounds: Normal heart sounds. No murmur.  Pulmonary:     Effort: Pulmonary effort is normal. No respiratory distress.     Breath sounds: Normal breath sounds. No decreased air movement. No decreased breath sounds, wheezing or rales.     Comments: Limited pulmonary exam due to patient being in motorized wheelchair Abdominal:     General:  Abdomen is flat. Bowel sounds are normal.     Palpations: Abdomen is soft.  Musculoskeletal:     Right lower leg: Edema present.     Left lower leg: Edema present.  Skin:    General: Skin is warm and dry.     Capillary Refill: Capillary refill takes less than 2 seconds.  Neurological:     Mental Status: She is alert. Mental status is at baseline.     Motor: Weakness present.     Gait: Gait abnormal.     Comments: In motorized wheelchair  Psychiatric:        Mood and Affect: Mood normal.        Behavior: Behavior normal.        Thought Content: Thought content normal.        Judgment: Judgment normal.      Lab Results:  CBC    Component Value Date/Time   WBC 6.8 03/11/2018 1506   RBC 3.47 (L) 03/11/2018 1506   HGB 13.3 03/11/2018 1506   HGB 14.1 10/06/2016 1439   HCT 42.3 03/11/2018 1506   HCT 43.5 10/06/2016 1439   PLT 454 (H) 03/11/2018 1506   PLT 259 Clumped Platelets--Appears Adequate 10/06/2016 1439   MCV 121.9 (H) 03/11/2018 1506   MCV 114.2 (H) 10/06/2016 1439   MCH 38.3 (H) 03/11/2018 1506   MCHC 31.4 03/11/2018 1506   RDW 15.1 03/11/2018 1506   RDW 16.2 (H) 10/06/2016 1439   LYMPHSABS 0.9 03/11/2018 1506   LYMPHSABS 1.0 10/06/2016 1439   MONOABS 0.3 03/11/2018 1506   MONOABS 0.5 10/06/2016 1439   EOSABS 0.1 03/11/2018 1506   EOSABS 0.1 10/06/2016 1439   BASOSABS 0.1 03/11/2018 1506   BASOSABS 0.1 10/06/2016 1439    BMET    Component Value Date/Time   NA 140 10/05/2017 1431   NA 142 10/06/2016 1352   K 3.8 10/05/2017 1431   K 4.1 10/06/2016 1352   CL 106 10/05/2017 1431   CO2 28 10/05/2017 1431   CO2 27 10/06/2016 1352   GLUCOSE 93 10/05/2017 1431   GLUCOSE 90  10/06/2016 1352   BUN 8 10/05/2017 1431   BUN 12.9 10/06/2016 1352   CREATININE 0.33 (L) 10/05/2017 1431   CREATININE 0.6 10/06/2016 1352   CALCIUM 8.7 (L) 10/05/2017 1431   CALCIUM 9.4 10/06/2016 1352   GFRNONAA >60 10/05/2017 1431   GFRAA >60 10/05/2017 1431    BNP    Component  Value Date/Time   BNP 899.5 (H) 02/12/2017 1531    ProBNP    Component Value Date/Time   PROBNP 69.0 05/24/2017 1625      Assessment & Plan:   Chronic respiratory failure with hypoxia ([place]) Plan: We will order overnight oximetry Continue oxygen therapy as prescribed Follow-up with our office in 3 months  Aspiration pneumonia ([place]) History of  Plan: We will continue to monitor clinically Continue to follow dysphagia 2 diet If symptoms worsen please contact our office so we can evaluate as well as complete chest x-ray  Dysphagia Plan: Continue dysphagia 2 diet Continue to monitor for aspiration    Return in about 3 months (around 11/19/2018), or if symptoms worsen or fail to improve, for Follow up with Dr. Melvyn Novas.   Lauraine Rinne, NP 08/19/2018   This appointment was 26 minutes long with over 50% of the time in direct face-to-face patient care, assessment, plan of care, and follow-up.

## 2018-08-19 NOTE — Patient Instructions (Signed)
You were seen today by Lauraine Rinne, NP  for:   1. Chronic respiratory failure with hypoxia (HCC)  - Pulse oximetry, overnight; Future  Continue oxygen therapy as prescribed  >>>maintain oxygen saturations greater than 88 percent  >>>if unable to maintain oxygen saturations please contact the office  >>>do not smoke with oxygen  >>>can use nasal saline gel or nasal saline rinses to moisturize nose if oxygen causes dryness  [facility] is now called [facility] >>>Contact number for them is Dimas Chyle [phone] or [phone] ext [phone]    We recommend today:  Orders Placed This Encounter  Procedures  . Pulse oximetry, overnight    Dme: [facility]    Standing Status:   Future    Standing Expiration Date:   08/19/2019    Scheduling Instructions:     On RA   Orders Placed This Encounter  Procedures  . Pulse oximetry, overnight   No orders of the defined types were placed in this encounter.   Follow Up:    Return in about 3 months (around 11/19/2018), or if symptoms worsen or fail to improve, for Follow up with Dr. Melvyn Novas.   Please do your part to reduce the spread of COVID-19:      Reduce your risk of any infection  and COVID19 by using the similar precautions used for avoiding the common cold or flu:  [name] Wash your hands often with soap and warm water for at least 20 seconds.  If soap and water are not readily available, use an alcohol-based hand sanitizer with at least 60% alcohol.  . If coughing or sneezing, cover your mouth and nose by coughing or sneezing into the elbow areas of your shirt or coat, into a tissue or into your sleeve (not your hands). Langley Gauss A MASK when in public  . Avoid shaking hands with others and consider head nods or verbal greetings only. . Avoid touching your eyes, nose, or mouth with unwashed hands.  . Avoid close contact with people who are sick. . Avoid places or events with large numbers of people in one location, like concerts or sporting  events. . If you have some symptoms but not all symptoms, continue to monitor at home and seek medical attention if your symptoms worsen. . If you are having a medical emergency, call 911.   [place] / e-Visit: [email or web address]         [facility]: [facility]: [phone]                   [facility]: [phone]     It is flu season:   >>> Best ways to protect herself from the flu: Receive the yearly flu vaccine, practice good hand hygiene washing with soap and also using hand sanitizer when available, eat a nutritious meals, get adequate rest, hydrate appropriately   Please contact the office if your symptoms worsen or you have concerns that you are not improving.   Thank you for choosing [facility] for your healthcare, and for allowing [place] to partner with you on your healthcare journey. I am thankful to be able to provide care to you today.   Wyn Quaker FNP-C

## 2018-08-19 NOTE — Telephone Encounter (Signed)
Left message for patient's daughter to call back. Will need to find out switch provider the patient and daughter have decided on.

## 2018-08-21 NOTE — Progress Notes (Signed)
Chart and office note reviewed in detail  > agree with a/p as outlined    

## 2018-08-22 ENCOUNTER — Other Ambulatory Visit: Payer: Self-pay | Admitting: Internal Medicine

## 2018-08-22 NOTE — Telephone Encounter (Signed)
Attempted to call pt's daughter [name] but unable to reach. Left message for her to return call.

## 2018-08-23 ENCOUNTER — Telehealth: Payer: Self-pay | Admitting: Internal Medicine

## 2018-08-23 DIAGNOSIS — T81718D Complication of other artery following a procedure, not elsewhere classified, subsequent encounter: Secondary | ICD-10-CM | POA: Diagnosis not present

## 2018-08-23 DIAGNOSIS — L89312 Pressure ulcer of right buttock, stage 2: Secondary | ICD-10-CM | POA: Diagnosis not present

## 2018-08-23 DIAGNOSIS — L89323 Pressure ulcer of left buttock, stage 3: Secondary | ICD-10-CM | POA: Diagnosis not present

## 2018-08-23 DIAGNOSIS — L89322 Pressure ulcer of left buttock, stage 2: Secondary | ICD-10-CM | POA: Diagnosis not present

## 2018-08-23 DIAGNOSIS — L7632 Postprocedural hematoma of skin and subcutaneous tissue following other procedure: Secondary | ICD-10-CM | POA: Diagnosis not present

## 2018-08-23 DIAGNOSIS — L89150 Pressure ulcer of sacral region, unstageable: Secondary | ICD-10-CM | POA: Diagnosis not present

## 2018-08-23 NOTE — Telephone Encounter (Signed)
Copied from [place] [id number]. Topic: General - Other >> Aug 23, 2018  4:23 PM Leward Quan A wrote: Reason for CRM: Ilene nurse with [facility] would like to collect INR sample on Thursday 08/25/2018 instead of 08/24/2018 asking for permission from Dr Quay Burow. Please contact Ilene at  Ph# [phone]

## 2018-08-23 NOTE — Telephone Encounter (Signed)
Attempted to call pt's daughter [name] but unable to reach. Left message for her to return call.

## 2018-08-24 ENCOUNTER — Ambulatory Visit (INDEPENDENT_AMBULATORY_CARE_PROVIDER_SITE_OTHER): Payer: Medicare Other | Admitting: General Practice

## 2018-08-24 DIAGNOSIS — L89322 Pressure ulcer of left buttock, stage 2: Secondary | ICD-10-CM | POA: Diagnosis not present

## 2018-08-24 DIAGNOSIS — L7632 Postprocedural hematoma of skin and subcutaneous tissue following other procedure: Secondary | ICD-10-CM | POA: Diagnosis not present

## 2018-08-24 DIAGNOSIS — L89150 Pressure ulcer of sacral region, unstageable: Secondary | ICD-10-CM | POA: Diagnosis not present

## 2018-08-24 DIAGNOSIS — L89312 Pressure ulcer of right buttock, stage 2: Secondary | ICD-10-CM | POA: Diagnosis not present

## 2018-08-24 DIAGNOSIS — T81718D Complication of other artery following a procedure, not elsewhere classified, subsequent encounter: Secondary | ICD-10-CM | POA: Diagnosis not present

## 2018-08-24 DIAGNOSIS — L89323 Pressure ulcer of left buttock, stage 3: Secondary | ICD-10-CM | POA: Diagnosis not present

## 2018-08-24 DIAGNOSIS — Z7901 Long term (current) use of anticoagulants: Secondary | ICD-10-CM

## 2018-08-24 LAB — POCT INR: INR: 2.1 (ref 2.0–3.0)

## 2018-08-24 NOTE — Telephone Encounter (Signed)
Yes, ok 

## 2018-08-24 NOTE — Telephone Encounter (Signed)
Per attempt 3 times with messages left, will close per Triage protocol

## 2018-08-24 NOTE — Patient Instructions (Signed)
Pre visit review using our clinic review tool, if applicable. No additional management support is needed unless otherwise documented below in the visit note.  Please continue to take 1/2 tablet daily except 1 tablet on Mon Wed and Fridays. Dosing instructions given to  [facility], RN @ [facility] [phone].  Re-check in 2 weeks.

## 2018-08-24 NOTE — Telephone Encounter (Signed)
Ilene aware of response.

## 2018-08-24 NOTE — Telephone Encounter (Signed)
Ok with you?

## 2018-08-26 DIAGNOSIS — L7632 Postprocedural hematoma of skin and subcutaneous tissue following other procedure: Secondary | ICD-10-CM | POA: Diagnosis not present

## 2018-08-26 DIAGNOSIS — L89322 Pressure ulcer of left buttock, stage 2: Secondary | ICD-10-CM | POA: Diagnosis not present

## 2018-08-26 DIAGNOSIS — T81718D Complication of other artery following a procedure, not elsewhere classified, subsequent encounter: Secondary | ICD-10-CM | POA: Diagnosis not present

## 2018-08-26 DIAGNOSIS — L89150 Pressure ulcer of sacral region, unstageable: Secondary | ICD-10-CM | POA: Diagnosis not present

## 2018-08-26 DIAGNOSIS — L89312 Pressure ulcer of right buttock, stage 2: Secondary | ICD-10-CM | POA: Diagnosis not present

## 2018-08-26 DIAGNOSIS — L89323 Pressure ulcer of left buttock, stage 3: Secondary | ICD-10-CM | POA: Diagnosis not present

## 2018-08-29 DIAGNOSIS — L89322 Pressure ulcer of left buttock, stage 2: Secondary | ICD-10-CM | POA: Diagnosis not present

## 2018-08-29 DIAGNOSIS — L7632 Postprocedural hematoma of skin and subcutaneous tissue following other procedure: Secondary | ICD-10-CM | POA: Diagnosis not present

## 2018-08-29 DIAGNOSIS — L89312 Pressure ulcer of right buttock, stage 2: Secondary | ICD-10-CM | POA: Diagnosis not present

## 2018-08-29 DIAGNOSIS — L89323 Pressure ulcer of left buttock, stage 3: Secondary | ICD-10-CM | POA: Diagnosis not present

## 2018-08-29 DIAGNOSIS — L89150 Pressure ulcer of sacral region, unstageable: Secondary | ICD-10-CM | POA: Diagnosis not present

## 2018-08-29 DIAGNOSIS — T81718D Complication of other artery following a procedure, not elsewhere classified, subsequent encounter: Secondary | ICD-10-CM | POA: Diagnosis not present

## 2018-08-30 ENCOUNTER — Other Ambulatory Visit: Payer: Self-pay

## 2018-08-30 ENCOUNTER — Encounter: Payer: Medicare Other | Attending: Physical Medicine & Rehabilitation | Admitting: Physical Medicine & Rehabilitation

## 2018-08-30 ENCOUNTER — Encounter: Payer: Self-pay | Admitting: Physical Medicine & Rehabilitation

## 2018-08-30 VITALS — BP 114/77 | HR 102 | Temp 98.2°F | Ht 60.0 in | Wt 176.0 lb

## 2018-08-30 DIAGNOSIS — G8110 Spastic hemiplegia affecting unspecified side: Secondary | ICD-10-CM | POA: Insufficient documentation

## 2018-08-30 DIAGNOSIS — G8111 Spastic hemiplegia affecting right dominant side: Secondary | ICD-10-CM | POA: Diagnosis not present

## 2018-08-30 NOTE — Progress Notes (Signed)
Dysport Injection for spasticity using needle EMG guidance  Dilution: 200 Units/ml Indication: Severe spasticity which interferes with ADL,mobility and/or  hygiene and is unresponsive to medication management and other conservative care Informed consent was obtained after describing risks and benefits of the procedure with the patient. This includes bleeding, bruising, infection, excessive weakness, or medication side effects. A REMS form is on file and signed. Needle:  needle electrode Number of units per muscle Pec 200U  All injections were done after obtaining appropriate EMG activity and after negative drawback for blood. The patient tolerated the procedure well. Post procedure instructions were given. A followup appointment was made.

## 2018-08-31 DIAGNOSIS — L89150 Pressure ulcer of sacral region, unstageable: Secondary | ICD-10-CM | POA: Diagnosis not present

## 2018-08-31 DIAGNOSIS — T81718D Complication of other artery following a procedure, not elsewhere classified, subsequent encounter: Secondary | ICD-10-CM | POA: Diagnosis not present

## 2018-08-31 DIAGNOSIS — L89323 Pressure ulcer of left buttock, stage 3: Secondary | ICD-10-CM | POA: Diagnosis not present

## 2018-08-31 DIAGNOSIS — L89312 Pressure ulcer of right buttock, stage 2: Secondary | ICD-10-CM | POA: Diagnosis not present

## 2018-08-31 DIAGNOSIS — L89322 Pressure ulcer of left buttock, stage 2: Secondary | ICD-10-CM | POA: Diagnosis not present

## 2018-08-31 DIAGNOSIS — L7632 Postprocedural hematoma of skin and subcutaneous tissue following other procedure: Secondary | ICD-10-CM | POA: Diagnosis not present

## 2018-09-02 DIAGNOSIS — L89322 Pressure ulcer of left buttock, stage 2: Secondary | ICD-10-CM | POA: Diagnosis not present

## 2018-09-02 DIAGNOSIS — L89150 Pressure ulcer of sacral region, unstageable: Secondary | ICD-10-CM | POA: Diagnosis not present

## 2018-09-02 DIAGNOSIS — L89323 Pressure ulcer of left buttock, stage 3: Secondary | ICD-10-CM | POA: Diagnosis not present

## 2018-09-02 DIAGNOSIS — L89312 Pressure ulcer of right buttock, stage 2: Secondary | ICD-10-CM | POA: Diagnosis not present

## 2018-09-02 DIAGNOSIS — T81718D Complication of other artery following a procedure, not elsewhere classified, subsequent encounter: Secondary | ICD-10-CM | POA: Diagnosis not present

## 2018-09-02 DIAGNOSIS — L7632 Postprocedural hematoma of skin and subcutaneous tissue following other procedure: Secondary | ICD-10-CM | POA: Diagnosis not present

## 2018-09-04 ENCOUNTER — Encounter: Payer: Self-pay | Admitting: Pulmonary Disease

## 2018-09-04 DIAGNOSIS — J449 Chronic obstructive pulmonary disease, unspecified: Secondary | ICD-10-CM | POA: Diagnosis not present

## 2018-09-04 DIAGNOSIS — R0902 Hypoxemia: Secondary | ICD-10-CM | POA: Diagnosis not present

## 2018-09-05 DIAGNOSIS — L89322 Pressure ulcer of left buttock, stage 2: Secondary | ICD-10-CM | POA: Diagnosis not present

## 2018-09-05 DIAGNOSIS — L89312 Pressure ulcer of right buttock, stage 2: Secondary | ICD-10-CM | POA: Diagnosis not present

## 2018-09-05 DIAGNOSIS — L89323 Pressure ulcer of left buttock, stage 3: Secondary | ICD-10-CM | POA: Diagnosis not present

## 2018-09-05 DIAGNOSIS — L7632 Postprocedural hematoma of skin and subcutaneous tissue following other procedure: Secondary | ICD-10-CM | POA: Diagnosis not present

## 2018-09-05 DIAGNOSIS — T81718D Complication of other artery following a procedure, not elsewhere classified, subsequent encounter: Secondary | ICD-10-CM | POA: Diagnosis not present

## 2018-09-05 DIAGNOSIS — L89150 Pressure ulcer of sacral region, unstageable: Secondary | ICD-10-CM | POA: Diagnosis not present

## 2018-09-06 ENCOUNTER — Encounter (HOSPITAL_BASED_OUTPATIENT_CLINIC_OR_DEPARTMENT_OTHER): Payer: Medicare Other | Attending: Internal Medicine

## 2018-09-06 ENCOUNTER — Telehealth: Payer: Self-pay | Admitting: Internal Medicine

## 2018-09-06 DIAGNOSIS — Z86718 Personal history of other venous thrombosis and embolism: Secondary | ICD-10-CM | POA: Insufficient documentation

## 2018-09-06 DIAGNOSIS — L89313 Pressure ulcer of right buttock, stage 3: Secondary | ICD-10-CM | POA: Diagnosis not present

## 2018-09-06 DIAGNOSIS — L89323 Pressure ulcer of left buttock, stage 3: Secondary | ICD-10-CM | POA: Insufficient documentation

## 2018-09-06 DIAGNOSIS — L89314 Pressure ulcer of right buttock, stage 4: Secondary | ICD-10-CM | POA: Diagnosis not present

## 2018-09-06 DIAGNOSIS — L89322 Pressure ulcer of left buttock, stage 2: Secondary | ICD-10-CM | POA: Insufficient documentation

## 2018-09-06 DIAGNOSIS — L89329 Pressure ulcer of left buttock, unspecified stage: Secondary | ICD-10-CM | POA: Diagnosis not present

## 2018-09-06 NOTE — Telephone Encounter (Signed)
Original message closed from last month due to not being able to contact patient's daughter. They are requesting to change providers from Dr. Melvyn Novas to someone else. Detailed message left explained we need to know which provider they have decided to switch to?   Valerie Salts, CMA     5:13 PM Note   Left message for patient's daughter to call back. Will need to find out switch provider the patient and daughter have decided on.         5:03 PM Moton, Claiborne Billings, NT routed this conversation to [facility] Triage Pool  Chuck,Teresa (Emergency Contact) to Corie Chiquito, NT      4:59 PM PT needs to have a f/u per BM with [facility] but pt and daughter would like it to be with a AO OR LA. Pt is aware of policy of switching providers. If someone could give her a call back

## 2018-09-07 ENCOUNTER — Telehealth: Payer: Self-pay | Admitting: Internal Medicine

## 2018-09-07 ENCOUNTER — Ambulatory Visit (INDEPENDENT_AMBULATORY_CARE_PROVIDER_SITE_OTHER): Payer: Medicare Other | Admitting: General Practice

## 2018-09-07 DIAGNOSIS — J9611 Chronic respiratory failure with hypoxia: Secondary | ICD-10-CM | POA: Diagnosis not present

## 2018-09-07 DIAGNOSIS — I69351 Hemiplegia and hemiparesis following cerebral infarction affecting right dominant side: Secondary | ICD-10-CM | POA: Diagnosis not present

## 2018-09-07 DIAGNOSIS — I69390 Apraxia following cerebral infarction: Secondary | ICD-10-CM | POA: Diagnosis not present

## 2018-09-07 DIAGNOSIS — R1310 Dysphagia, unspecified: Secondary | ICD-10-CM | POA: Diagnosis not present

## 2018-09-07 DIAGNOSIS — T81718D Complication of other artery following a procedure, not elsewhere classified, subsequent encounter: Secondary | ICD-10-CM | POA: Diagnosis not present

## 2018-09-07 DIAGNOSIS — L89323 Pressure ulcer of left buttock, stage 3: Secondary | ICD-10-CM | POA: Diagnosis not present

## 2018-09-07 DIAGNOSIS — Z4800 Encounter for change or removal of nonsurgical wound dressing: Secondary | ICD-10-CM | POA: Diagnosis not present

## 2018-09-07 DIAGNOSIS — H903 Sensorineural hearing loss, bilateral: Secondary | ICD-10-CM | POA: Diagnosis not present

## 2018-09-07 DIAGNOSIS — E0520 Thyrotoxicosis with toxic multinodular goiter without thyrotoxic crisis or storm: Secondary | ICD-10-CM | POA: Diagnosis not present

## 2018-09-07 DIAGNOSIS — Z86711 Personal history of pulmonary embolism: Secondary | ICD-10-CM | POA: Diagnosis not present

## 2018-09-07 DIAGNOSIS — I69320 Aphasia following cerebral infarction: Secondary | ICD-10-CM | POA: Diagnosis not present

## 2018-09-07 DIAGNOSIS — G43909 Migraine, unspecified, not intractable, without status migrainosus: Secondary | ICD-10-CM | POA: Diagnosis not present

## 2018-09-07 DIAGNOSIS — L7632 Postprocedural hematoma of skin and subcutaneous tissue following other procedure: Secondary | ICD-10-CM | POA: Diagnosis not present

## 2018-09-07 DIAGNOSIS — L89150 Pressure ulcer of sacral region, unstageable: Secondary | ICD-10-CM | POA: Diagnosis not present

## 2018-09-07 DIAGNOSIS — Z9981 Dependence on supplemental oxygen: Secondary | ICD-10-CM | POA: Diagnosis not present

## 2018-09-07 DIAGNOSIS — Z7401 Bed confinement status: Secondary | ICD-10-CM | POA: Diagnosis not present

## 2018-09-07 DIAGNOSIS — D473 Essential (hemorrhagic) thrombocythemia: Secondary | ICD-10-CM | POA: Diagnosis not present

## 2018-09-07 DIAGNOSIS — I69391 Dysphagia following cerebral infarction: Secondary | ICD-10-CM | POA: Diagnosis not present

## 2018-09-07 DIAGNOSIS — Z7901 Long term (current) use of anticoagulants: Secondary | ICD-10-CM | POA: Diagnosis not present

## 2018-09-07 DIAGNOSIS — L89312 Pressure ulcer of right buttock, stage 2: Secondary | ICD-10-CM | POA: Diagnosis not present

## 2018-09-07 DIAGNOSIS — G629 Polyneuropathy, unspecified: Secondary | ICD-10-CM | POA: Diagnosis not present

## 2018-09-07 DIAGNOSIS — L89322 Pressure ulcer of left buttock, stage 2: Secondary | ICD-10-CM | POA: Diagnosis not present

## 2018-09-07 DIAGNOSIS — M150 Primary generalized (osteo)arthritis: Secondary | ICD-10-CM | POA: Diagnosis not present

## 2018-09-07 DIAGNOSIS — Z87440 Personal history of urinary (tract) infections: Secondary | ICD-10-CM | POA: Diagnosis not present

## 2018-09-07 DIAGNOSIS — Z86718 Personal history of other venous thrombosis and embolism: Secondary | ICD-10-CM | POA: Diagnosis not present

## 2018-09-07 LAB — POCT INR: INR: 4 — AB (ref 2.0–3.0)

## 2018-09-07 NOTE — Telephone Encounter (Signed)
Please advise 

## 2018-09-07 NOTE — Telephone Encounter (Signed)
Gave ok for orders.  

## 2018-09-07 NOTE — Patient Instructions (Signed)
Pre visit review using our clinic review tool, if applicable. No additional management support is needed unless otherwise documented below in the visit note.  Hold coumadin today and tomorrow and then continue to take 1/2 tablet daily except 1 tablet on Mon Wed and Fridays. Dosing instructions given to  [facility], RN @ [facility] [phone].  Re-check on 9/11.

## 2018-09-07 NOTE — Telephone Encounter (Signed)
Copied from [place] [id number]. Topic: General - Other >> Sep 07, 2018 12:30 PM Yvette Rack wrote: Reason for CRM: Deatra Canter with [facility] stated she is at pt home and pt daughter feels as though pt has an UTI. Dyann Ruddle requests in and out catheterization urinalysis. Dyann Ruddle would like to do this while at pt home. Cb# [phone]

## 2018-09-07 NOTE — Progress Notes (Signed)
I have reviewed the results and agree with this plan   

## 2018-09-07 NOTE — Telephone Encounter (Signed)
ok 

## 2018-09-07 NOTE — Telephone Encounter (Signed)
Pt's daughter [name] calling back.  [phone].

## 2018-09-07 NOTE — Telephone Encounter (Signed)
Spoke with pt's daughter and she would like to change providers from Dr. Melvyn Novas to Dr. Noemi Chapel. I will send the message to see if it is ok to make the switch. Please advise.   Sabina to check on the ONO results pt's daughter requested. I held for 15 minutes but had to leave a message for them to call back about ONO results. Will try again later.

## 2018-09-07 NOTE — Telephone Encounter (Signed)
Fine with me

## 2018-09-07 NOTE — Telephone Encounter (Signed)
Ok will await Sr. Clark's response.

## 2018-09-09 DIAGNOSIS — Z87440 Personal history of urinary (tract) infections: Secondary | ICD-10-CM | POA: Diagnosis not present

## 2018-09-09 DIAGNOSIS — L89322 Pressure ulcer of left buttock, stage 2: Secondary | ICD-10-CM | POA: Diagnosis not present

## 2018-09-09 DIAGNOSIS — E0520 Thyrotoxicosis with toxic multinodular goiter without thyrotoxic crisis or storm: Secondary | ICD-10-CM | POA: Diagnosis not present

## 2018-09-09 DIAGNOSIS — L89312 Pressure ulcer of right buttock, stage 2: Secondary | ICD-10-CM | POA: Diagnosis not present

## 2018-09-09 DIAGNOSIS — L89313 Pressure ulcer of right buttock, stage 3: Secondary | ICD-10-CM | POA: Diagnosis not present

## 2018-09-09 DIAGNOSIS — L7632 Postprocedural hematoma of skin and subcutaneous tissue following other procedure: Secondary | ICD-10-CM | POA: Diagnosis not present

## 2018-09-09 DIAGNOSIS — L89323 Pressure ulcer of left buttock, stage 3: Secondary | ICD-10-CM | POA: Diagnosis not present

## 2018-09-09 DIAGNOSIS — L89150 Pressure ulcer of sacral region, unstageable: Secondary | ICD-10-CM | POA: Diagnosis not present

## 2018-09-09 DIAGNOSIS — T81718D Complication of other artery following a procedure, not elsewhere classified, subsequent encounter: Secondary | ICD-10-CM | POA: Diagnosis not present

## 2018-09-12 ENCOUNTER — Other Ambulatory Visit: Payer: Self-pay | Admitting: Hematology

## 2018-09-12 DIAGNOSIS — L89323 Pressure ulcer of left buttock, stage 3: Secondary | ICD-10-CM | POA: Diagnosis not present

## 2018-09-12 DIAGNOSIS — T81718D Complication of other artery following a procedure, not elsewhere classified, subsequent encounter: Secondary | ICD-10-CM | POA: Diagnosis not present

## 2018-09-12 DIAGNOSIS — L89312 Pressure ulcer of right buttock, stage 2: Secondary | ICD-10-CM | POA: Diagnosis not present

## 2018-09-12 DIAGNOSIS — L89322 Pressure ulcer of left buttock, stage 2: Secondary | ICD-10-CM | POA: Diagnosis not present

## 2018-09-12 DIAGNOSIS — L7632 Postprocedural hematoma of skin and subcutaneous tissue following other procedure: Secondary | ICD-10-CM | POA: Diagnosis not present

## 2018-09-12 DIAGNOSIS — L89150 Pressure ulcer of sacral region, unstageable: Secondary | ICD-10-CM | POA: Diagnosis not present

## 2018-09-13 DIAGNOSIS — Z96641 Presence of right artificial hip joint: Secondary | ICD-10-CM | POA: Diagnosis not present

## 2018-09-13 DIAGNOSIS — S31819A Unspecified open wound of right buttock, initial encounter: Secondary | ICD-10-CM | POA: Diagnosis not present

## 2018-09-13 DIAGNOSIS — M810 Age-related osteoporosis without current pathological fracture: Secondary | ICD-10-CM | POA: Diagnosis not present

## 2018-09-13 DIAGNOSIS — E0520 Thyrotoxicosis with toxic multinodular goiter without thyrotoxic crisis or storm: Secondary | ICD-10-CM | POA: Diagnosis not present

## 2018-09-14 ENCOUNTER — Telehealth: Payer: Self-pay | Admitting: Pulmonary Disease

## 2018-09-14 DIAGNOSIS — L89312 Pressure ulcer of right buttock, stage 2: Secondary | ICD-10-CM | POA: Diagnosis not present

## 2018-09-14 DIAGNOSIS — T81718D Complication of other artery following a procedure, not elsewhere classified, subsequent encounter: Secondary | ICD-10-CM | POA: Diagnosis not present

## 2018-09-14 DIAGNOSIS — L89323 Pressure ulcer of left buttock, stage 3: Secondary | ICD-10-CM | POA: Diagnosis not present

## 2018-09-14 DIAGNOSIS — L89150 Pressure ulcer of sacral region, unstageable: Secondary | ICD-10-CM | POA: Diagnosis not present

## 2018-09-14 DIAGNOSIS — L7632 Postprocedural hematoma of skin and subcutaneous tissue following other procedure: Secondary | ICD-10-CM | POA: Diagnosis not present

## 2018-09-14 DIAGNOSIS — L89322 Pressure ulcer of left buttock, stage 2: Secondary | ICD-10-CM | POA: Diagnosis not present

## 2018-09-14 NOTE — Telephone Encounter (Signed)
Dr Loletta Specter responded in routing comment:  Julian Hy, DO  Jannette Spanner, CMA 16 hours ago (8:48 PM)   That is fine with me, thanks!   Routing comment     Jannette Spanner, CMA 7 days ago    Spoke with [name] and scheduled appt with Dr Carlis Abbott for 11/23/18

## 2018-09-14 NOTE — Telephone Encounter (Signed)
Call returned to Sandy Barrett with [facility], she reports they need an order to D/C patients portable oxygen. She states this is based off ONO which shows the patient only needs oxygen at night. She states she does not understand how the patient got oxygen during the day because she does not see where she has been qualified. If she needs it during the day she will need to be qualified. Made aware I would get this message to [place]. Voiced understanding.   Per last AVS with B Mack, patient does not use oxygen during the day.   I do not see that the ONO has been scanned into Epic, requested a copy be faxed to our office so that we can contact patient regarding results as well as to notify them they will need to D/C her portable oxygen.   Will hold message in triage until ONO has been received.

## 2018-09-15 ENCOUNTER — Other Ambulatory Visit: Payer: Self-pay | Admitting: Interventional Radiology

## 2018-09-15 DIAGNOSIS — E049 Nontoxic goiter, unspecified: Secondary | ICD-10-CM

## 2018-09-15 NOTE — Telephone Encounter (Signed)
Appreciate you taking deeper in this and looking at this information.  Based off this as well as based off the documented notes on 07/11/2018 then patient should remain on oxygen during the day based off of oxygen saturations.  [facility] will have to work with home health care as well as primary care to get these records to justify use.I would recommend that we continue to maintain oxygen saturations above 90%.Wyn Quaker, FNP

## 2018-09-15 NOTE — Telephone Encounter (Signed)
lmtcb for Santiago Glad with [facility].

## 2018-09-15 NOTE — Telephone Encounter (Signed)
09/15/2018 [id number]  Unsure what [employer] is talking about.  From my understanding patient does not use oxygen during the day or with exertion.  Although be difficult for the patient to qualify as she is in a motorized wheelchair and has complications after a CVA I believe.  Her daughter who is her primary caregiver would probably be a to give more information regarding this.  Most recent overnight oximetry on room air which was required to be completed by [employer] confirms the patient does need to be continued on oxygen therapy at night 2 L.  09/04/2018- overnight oximetry on room air- duration 9 hours and 58 minutes, oxygen levels below 88% for 2 hours and 16 minutes  Wyn Quaker, FNP

## 2018-09-15 NOTE — Telephone Encounter (Signed)
Sandy Barrett from [employer] returning phone call.  Phone number is [phone].

## 2018-09-15 NOTE — Telephone Encounter (Signed)
Called [employer], spoke with Santiago Glad advised that based on notations from [place] and patient daughter they will need to contact Dr. Quay Burow office regarding the POC order originally signed.. Nothing further needed at this time.

## 2018-09-15 NOTE — Telephone Encounter (Signed)
Per 2.13.2019 hospital discharge: Disposition Plan: home with home health on 2/13, recommend home 02 due to patient likely has acute on chronic hypoxic respiratory failure.  Her o2sets dropped to mid 80's, she is not symptomatic from it, likely her body has tolerated hypoxia for a while, daughter reports to me that patient has had low oxygen level at doctor's office for a while. Daughter also reports patient snore at night, will benefit from sleep study as well.  Called spoke with patient's daughter [name] who verified that patient went home with O2 at the 02/2017 discharge.  Patient uses a SimplyGO at home, 2L.  Per [name] pt wears O2 intermittently at home when O2 sats are below 90% [name] stated this was noted by pt's [facility]).  [name] stated the SimplyGo and backup O2 tank are all that patient uses; the SimplyGo is NOT set on pulsed dose.  While speaking with [name], did discuss ONO results and the need to wear O2 at bedtime and with naps.  It looks like the [facility] does check patient's O2 saturations at the home visits (per the scanned documents from [place]) and since patient's PCP is the authorizing provider, it looks like the correlating documentation is going to that office Please view the 7.6.2020 phone note to Dr Quay Burow where O2 sats were addressed.  Aaron Edelman please advise, thank you.

## 2018-09-15 NOTE — Telephone Encounter (Signed)
ONO has been received and given to [facility].  Sandy Barrett - please advise. Thanks!

## 2018-09-16 ENCOUNTER — Ambulatory Visit (INDEPENDENT_AMBULATORY_CARE_PROVIDER_SITE_OTHER): Payer: Medicare Other | Admitting: General Practice

## 2018-09-16 ENCOUNTER — Telehealth: Payer: Self-pay | Admitting: Internal Medicine

## 2018-09-16 DIAGNOSIS — L89322 Pressure ulcer of left buttock, stage 2: Secondary | ICD-10-CM | POA: Diagnosis not present

## 2018-09-16 DIAGNOSIS — Z7901 Long term (current) use of anticoagulants: Secondary | ICD-10-CM | POA: Diagnosis not present

## 2018-09-16 DIAGNOSIS — L89150 Pressure ulcer of sacral region, unstageable: Secondary | ICD-10-CM | POA: Diagnosis not present

## 2018-09-16 DIAGNOSIS — L89312 Pressure ulcer of right buttock, stage 2: Secondary | ICD-10-CM | POA: Diagnosis not present

## 2018-09-16 DIAGNOSIS — T81718D Complication of other artery following a procedure, not elsewhere classified, subsequent encounter: Secondary | ICD-10-CM | POA: Diagnosis not present

## 2018-09-16 DIAGNOSIS — L89323 Pressure ulcer of left buttock, stage 3: Secondary | ICD-10-CM | POA: Diagnosis not present

## 2018-09-16 DIAGNOSIS — R0902 Hypoxemia: Secondary | ICD-10-CM

## 2018-09-16 DIAGNOSIS — L7632 Postprocedural hematoma of skin and subcutaneous tissue following other procedure: Secondary | ICD-10-CM | POA: Diagnosis not present

## 2018-09-16 LAB — POCT INR: INR: 2.6 (ref 2.0–3.0)

## 2018-09-16 NOTE — Patient Instructions (Signed)
Pre visit review using our clinic review tool, if applicable. No additional management support is needed unless otherwise documented below in the visit note.  Continue to take 1/2 tablet daily except 1 tablet on Mon Wed and Fridays. Dosing instructions given to  [facility], RN @ [facility] [phone].  Re-check in 2 weeks.

## 2018-09-16 NOTE — Telephone Encounter (Signed)
Santiago Glad with [employer] is calling and needs an order to discontinue portable oxygen. This order was originally written by dr burns. Please fax to [phone]

## 2018-09-17 NOTE — Telephone Encounter (Signed)
DME ordered to d/c oxygen --- should printed.

## 2018-09-17 NOTE — Progress Notes (Signed)
Agree with management.  Stacy J Burns, MD  

## 2018-09-19 ENCOUNTER — Other Ambulatory Visit (HOSPITAL_COMMUNITY)
Admission: RE | Admit: 2018-09-19 | Discharge: 2018-09-19 | Disposition: A | Discharge status: Home / Self Care | Payer: Medicare Other | Source: Other Acute Inpatient Hospital | Attending: Internal Medicine | Admitting: Internal Medicine

## 2018-09-19 DIAGNOSIS — L89313 Pressure ulcer of right buttock, stage 3: Secondary | ICD-10-CM | POA: Insufficient documentation

## 2018-09-19 DIAGNOSIS — L89323 Pressure ulcer of left buttock, stage 3: Secondary | ICD-10-CM | POA: Diagnosis not present

## 2018-09-19 DIAGNOSIS — L7632 Postprocedural hematoma of skin and subcutaneous tissue following other procedure: Secondary | ICD-10-CM | POA: Diagnosis not present

## 2018-09-19 DIAGNOSIS — L89322 Pressure ulcer of left buttock, stage 2: Secondary | ICD-10-CM | POA: Diagnosis not present

## 2018-09-19 DIAGNOSIS — T81718D Complication of other artery following a procedure, not elsewhere classified, subsequent encounter: Secondary | ICD-10-CM | POA: Diagnosis not present

## 2018-09-19 DIAGNOSIS — L89150 Pressure ulcer of sacral region, unstageable: Secondary | ICD-10-CM | POA: Diagnosis not present

## 2018-09-19 DIAGNOSIS — L89312 Pressure ulcer of right buttock, stage 2: Secondary | ICD-10-CM | POA: Diagnosis not present

## 2018-09-19 LAB — CBC WITH DIFFERENTIAL/PLATELET
Abs Immature Granulocytes: 0.09 10*3/uL — ABNORMAL HIGH (ref 0.00–0.07)
Basophils Absolute: 0.1 10*3/uL (ref 0.0–0.1)
Basophils Relative: 1 %
Eosinophils Absolute: 0.2 10*3/uL (ref 0.0–0.5)
Eosinophils Relative: 1 %
HCT: 38.4 % (ref 36.0–46.0)
Hemoglobin: 11.9 g/dL — ABNORMAL LOW (ref 12.0–15.0)
Immature Granulocytes: 1 %
Lymphocytes Relative: 11 %
Lymphs Abs: 1.2 10*3/uL (ref 0.7–4.0)
MCH: 34.8 pg — ABNORMAL HIGH (ref 26.0–34.0)
MCHC: 31 g/dL (ref 30.0–36.0)
MCV: 112.3 fL — ABNORMAL HIGH (ref 80.0–100.0)
Monocytes Absolute: 0.6 10*3/uL (ref 0.1–1.0)
Monocytes Relative: 5 %
Neutro Abs: 8.5 10*3/uL — ABNORMAL HIGH (ref 1.7–7.7)
Neutrophils Relative %: 81 %
Platelets: 749 10*3/uL — ABNORMAL HIGH (ref 150–400)
RBC: 3.42 MIL/uL — ABNORMAL LOW (ref 3.87–5.11)
RDW: 17 % — ABNORMAL HIGH (ref 11.5–15.5)
WBC: 10.6 10*3/uL — ABNORMAL HIGH (ref 4.0–10.5)
nRBC: 0.2 % (ref 0.0–0.2)

## 2018-09-19 LAB — COMPREHENSIVE METABOLIC PANEL
ALT: 15 U/L (ref 0–44)
AST: 15 U/L (ref 15–41)
Albumin: 3.3 g/dL — ABNORMAL LOW (ref 3.5–5.0)
Alkaline Phosphatase: 83 U/L (ref 38–126)
Anion gap: 9 (ref 5–15)
BUN: 20 mg/dL (ref 8–23)
CO2: 26 mmol/L (ref 22–32)
Calcium: 9 mg/dL (ref 8.9–10.3)
Chloride: 105 mmol/L (ref 98–111)
Creatinine, Ser: 0.32 mg/dL — ABNORMAL LOW (ref 0.44–1.00)
GFR calc Af Amer: >60 mL/min (ref >60)
GFR calc non Af Amer: >60 mL/min (ref >60)
Glucose, Bld: 109 mg/dL — ABNORMAL HIGH (ref 70–99)
Potassium: 3.8 mmol/L (ref 3.5–5.1)
Sodium: 140 mmol/L (ref 135–145)
Total Bilirubin: 0.5 mg/dL (ref 0.3–1.2)
Total Protein: 7.5 g/dL (ref 6.5–8.1)

## 2018-09-19 LAB — SEDIMENTATION RATE: Sed Rate: 31 mm/hr — ABNORMAL HIGH (ref 0–22)

## 2018-09-19 NOTE — Telephone Encounter (Signed)
Faxed to number below.

## 2018-09-20 DIAGNOSIS — L89314 Pressure ulcer of right buttock, stage 4: Secondary | ICD-10-CM | POA: Diagnosis not present

## 2018-09-20 DIAGNOSIS — Z86718 Personal history of other venous thrombosis and embolism: Secondary | ICD-10-CM | POA: Diagnosis not present

## 2018-09-20 DIAGNOSIS — L89323 Pressure ulcer of left buttock, stage 3: Secondary | ICD-10-CM | POA: Diagnosis not present

## 2018-09-20 DIAGNOSIS — L89313 Pressure ulcer of right buttock, stage 3: Secondary | ICD-10-CM | POA: Diagnosis not present

## 2018-09-20 DIAGNOSIS — L89322 Pressure ulcer of left buttock, stage 2: Secondary | ICD-10-CM | POA: Diagnosis not present

## 2018-09-20 DIAGNOSIS — L89329 Pressure ulcer of left buttock, unspecified stage: Secondary | ICD-10-CM | POA: Diagnosis not present

## 2018-09-21 ENCOUNTER — Other Ambulatory Visit: Payer: Self-pay | Admitting: Hematology and Oncology

## 2018-09-21 ENCOUNTER — Ambulatory Visit (HOSPITAL_COMMUNITY): Admission: RE | Admit: 2018-09-21 | Payer: Medicare Other | Source: Ambulatory Visit

## 2018-09-21 DIAGNOSIS — L89312 Pressure ulcer of right buttock, stage 2: Secondary | ICD-10-CM | POA: Diagnosis not present

## 2018-09-21 DIAGNOSIS — L7632 Postprocedural hematoma of skin and subcutaneous tissue following other procedure: Secondary | ICD-10-CM | POA: Diagnosis not present

## 2018-09-21 DIAGNOSIS — L89150 Pressure ulcer of sacral region, unstageable: Secondary | ICD-10-CM | POA: Diagnosis not present

## 2018-09-21 DIAGNOSIS — T81718D Complication of other artery following a procedure, not elsewhere classified, subsequent encounter: Secondary | ICD-10-CM | POA: Diagnosis not present

## 2018-09-21 DIAGNOSIS — L89323 Pressure ulcer of left buttock, stage 3: Secondary | ICD-10-CM | POA: Diagnosis not present

## 2018-09-21 DIAGNOSIS — L89322 Pressure ulcer of left buttock, stage 2: Secondary | ICD-10-CM | POA: Diagnosis not present

## 2018-09-21 NOTE — Addendum Note (Signed)
Addended by: Delice Bison E on: 09/21/2018 01:42 PM   Modules accepted: Orders

## 2018-09-21 NOTE — Telephone Encounter (Signed)
Santiago Glad from [employer]  States that the order received said to discontinue all oxygen.  They need order to discontinue portable oxygen only as pt is also on nocturnal oxygen. Santiago Glad can be reached at [phone].

## 2018-09-21 NOTE — Telephone Encounter (Signed)
New rx ordered and resent.

## 2018-09-22 ENCOUNTER — Ambulatory Visit (HOSPITAL_COMMUNITY): Payer: Medicare Other

## 2018-09-22 LAB — HIGH SENSITIVITY CRP: CRP, High Sensitivity: 99.29 mg/L — ABNORMAL HIGH (ref 0.00–3.00)

## 2018-09-23 DIAGNOSIS — T81718D Complication of other artery following a procedure, not elsewhere classified, subsequent encounter: Secondary | ICD-10-CM | POA: Diagnosis not present

## 2018-09-23 DIAGNOSIS — L7632 Postprocedural hematoma of skin and subcutaneous tissue following other procedure: Secondary | ICD-10-CM | POA: Diagnosis not present

## 2018-09-23 DIAGNOSIS — L89323 Pressure ulcer of left buttock, stage 3: Secondary | ICD-10-CM | POA: Diagnosis not present

## 2018-09-23 DIAGNOSIS — L89312 Pressure ulcer of right buttock, stage 2: Secondary | ICD-10-CM | POA: Diagnosis not present

## 2018-09-23 DIAGNOSIS — L89150 Pressure ulcer of sacral region, unstageable: Secondary | ICD-10-CM | POA: Diagnosis not present

## 2018-09-23 DIAGNOSIS — L89322 Pressure ulcer of left buttock, stage 2: Secondary | ICD-10-CM | POA: Diagnosis not present

## 2018-09-26 ENCOUNTER — Other Ambulatory Visit (HOSPITAL_COMMUNITY)
Admission: RE | Admit: 2018-09-26 | Discharge: 2018-09-26 | Disposition: A | Discharge status: Home / Self Care | Payer: Medicare Other | Attending: *Deleted | Admitting: *Deleted

## 2018-09-26 ENCOUNTER — Other Ambulatory Visit: Payer: Self-pay

## 2018-09-26 ENCOUNTER — Ambulatory Visit (HOSPITAL_COMMUNITY)
Admission: RE | Admit: 2018-09-26 | Discharge: 2018-09-26 | Disposition: A | Discharge status: Home / Self Care | Payer: Medicare Other | Source: Ambulatory Visit | Attending: Interventional Radiology | Admitting: Interventional Radiology

## 2018-09-26 DIAGNOSIS — R0989 Other specified symptoms and signs involving the circulatory and respiratory systems: Secondary | ICD-10-CM | POA: Diagnosis not present

## 2018-09-26 DIAGNOSIS — I270 Primary pulmonary hypertension: Secondary | ICD-10-CM | POA: Diagnosis not present

## 2018-09-26 DIAGNOSIS — T81718D Complication of other artery following a procedure, not elsewhere classified, subsequent encounter: Secondary | ICD-10-CM | POA: Diagnosis not present

## 2018-09-26 DIAGNOSIS — L89312 Pressure ulcer of right buttock, stage 2: Secondary | ICD-10-CM | POA: Diagnosis not present

## 2018-09-26 DIAGNOSIS — L89150 Pressure ulcer of sacral region, unstageable: Secondary | ICD-10-CM | POA: Diagnosis not present

## 2018-09-26 DIAGNOSIS — E049 Nontoxic goiter, unspecified: Secondary | ICD-10-CM

## 2018-09-26 DIAGNOSIS — L89322 Pressure ulcer of left buttock, stage 2: Secondary | ICD-10-CM | POA: Diagnosis not present

## 2018-09-26 DIAGNOSIS — L89323 Pressure ulcer of left buttock, stage 3: Secondary | ICD-10-CM | POA: Diagnosis not present

## 2018-09-26 DIAGNOSIS — L7632 Postprocedural hematoma of skin and subcutaneous tissue following other procedure: Secondary | ICD-10-CM | POA: Diagnosis not present

## 2018-09-26 LAB — POCT I-STAT CREATININE: Creatinine, Ser: 0.5 mg/dL (ref 0.44–1.00)

## 2018-09-26 LAB — TSH: TSH: 0.98 u[IU]/mL (ref 0.350–4.500)

## 2018-09-26 LAB — T4, FREE: Free T4: 1.56 ng/dL — ABNORMAL HIGH (ref 0.61–1.12)

## 2018-09-26 MED ORDER — SODIUM CHLORIDE (PF) 0.9 % IJ SOLN
INTRAMUSCULAR | Status: AC
  Filled 2018-09-26: qty 50

## 2018-09-26 MED ORDER — IOHEXOL 300 MG/ML  SOLN
75.0000 mL | Freq: Once | INTRAMUSCULAR | Status: AC | PRN
  Administered 2018-09-26: 18:00:00 75 mL via INTRAVENOUS

## 2018-09-27 ENCOUNTER — Other Ambulatory Visit: Payer: Self-pay

## 2018-09-27 ENCOUNTER — Encounter: Payer: Medicare Other | Attending: Physical Medicine & Rehabilitation | Admitting: Physical Medicine & Rehabilitation

## 2018-09-27 VITALS — BP 110/70 | HR 98 | Temp 98.3°F

## 2018-09-27 DIAGNOSIS — M1712 Unilateral primary osteoarthritis, left knee: Secondary | ICD-10-CM

## 2018-09-27 DIAGNOSIS — G8110 Spastic hemiplegia affecting unspecified side: Secondary | ICD-10-CM | POA: Insufficient documentation

## 2018-09-27 NOTE — Progress Notes (Signed)
Knee injection Left   Indication:Left Knee osteoarthritis pain not relieved by medication management and other conservative care.  Informed consent was obtained after describing risks and benefits of the procedure with the patient, this includes bleeding, bruising, infection and medication side effects. The patient wishes to proceed and has given written consent. The patient was placed in a recumbent position. The medial aspect of the knee was marked and prepped with Betadine and alcohol. Vapocoolant spray was applied. Then a 21g 2 in needle was inserted into the knee joint. After negative draw back for blood, a solution containing one ML of 6mg  per mL betamethasone and 4 mL of 1% lidocaine were injected. The patient tolerated the procedure well. Post procedure instructions were given.

## 2018-09-27 NOTE — Patient Instructions (Signed)
Knee Injection A knee injection is a procedure to get medicine into your knee joint to relieve the pain, swelling, and stiffness of arthritis. Your health care provider uses a needle to inject medicine, which may also help to lubricate and cushion your knee joint. You may need more than one injection. Tell a health care provider about:  Any allergies you have.  All medicines you are taking, including vitamins, herbs, eye drops, creams, and over-the-counter medicines.  Any problems you or family members have had with anesthetic medicines.  Any blood disorders you have.  Any surgeries you have had.  Any medical conditions you have.  Whether you are pregnant or may be pregnant. What are the risks? Generally, this is a safe procedure. However, problems may occur, including:  Infection.  Bleeding.  Symptoms that get worse.  Damage to the area around your knee.  Allergic reaction to any of the medicines.  Skin reactions from repeated injections. What happens before the procedure?  Ask your health care provider about changing or stopping your regular medicines. This is especially important if you are taking diabetes medicines or blood thinners.  Plan to have someone take you home from the hospital or clinic. What happens during the procedure?   You will sit or lie down in a position for your knee to be treated.  The skin over your kneecap will be cleaned with a germ-killing soap.  You will be given a medicine that numbs the area (local anesthetic). You may feel some stinging.  The medicine will be injected into your knee. The needle is carefully placed between your kneecap and your knee. The medicine is injected into the joint space.  The needle will be removed at the end of the procedure.  A bandage (dressing) may be placed over the injection site. The procedure may vary among health care providers and hospitals. What can I expect after the procedure?  Your blood  pressure, heart rate, breathing rate, and blood oxygen level will be monitored until you leave the hospital or clinic.  You may have to move your knee through its full range of motion. This helps to get all the medicine into your joint space.  You will be watched to make sure that you do not have a reaction to the injected medicine.  You may feel more pain, swelling, and warmth than you did before the injection. This reaction may last about 1-2 days. Follow these instructions at home: Medicines  Take over-the-counter and prescription medicines only as told by your doctor.  Do not drive or use heavy machinery while taking prescription pain medicine.  Do not take medicines such as aspirin and ibuprofen unless your health care provider tells you to take them. Injection site care  Follow instructions from your health care provider about: ? How to take care of your puncture site. ? When and how you should change your dressing. ? When you should remove your dressing.  Check your injection area every day for signs of infection. Check for: ? More redness, swelling, or pain after 2 days. ? Fluid or blood. ? Pus or a bad smell. ? Warmth. Managing pain, stiffness, and swelling   If directed, put ice on the injection area: ? Put ice in a plastic bag. ? Place a towel between your skin and the bag. ? Leave the ice on for 20 minutes, 2-3 times per day.  Do not apply heat to your knee.  Raise (elevate) the injection area above the level   of your heart while you are sitting or lying down. General instructions  If you were given a dressing, keep it dry until your health care provider says it can be removed. Ask your health care provider when you can start showering or taking a bath.  Avoid strenuous activities for as long as directed by your health care provider. Ask your health care provider when you can return to your normal activities.  Keep all follow-up visits as told by your health  care provider. This is important. You may need more injections. Contact a health care provider if you have:  A fever.  Warmth in your injection area.  Fluid, blood, or pus coming from your injection site.  Symptoms at your injection site that last longer than 2 days after your procedure. Get help right away if:  Your knee: ? Turns very red. ? Becomes very swollen. ? Is in severe pain. Summary  A knee injection is a procedure to get medicine into your knee joint to relieve the pain, swelling, and stiffness of arthritis.  A needle is carefully placed between your kneecap and your knee to inject medicine into the joint space.  Before the procedure, ask your health care provider about changing or stopping your regular medicines, especially if you are taking diabetes medicines or blood thinners.  Contact your health care provider if you have any problems or questions after your procedure. This information is not intended to replace advice given to you by your health care provider. Make sure you discuss any questions you have with your health care provider. Document Released: 03/15/2006 Document Revised: 01/11/2017 Document Reviewed: 01/11/2017 Elsevier Patient Education  2020 Elsevier Inc.  

## 2018-09-28 LAB — T3, FREE: T3, Free: 2.6 pg/mL (ref 2.0–4.4)

## 2018-09-30 ENCOUNTER — Telehealth: Payer: Self-pay

## 2018-09-30 DIAGNOSIS — L89322 Pressure ulcer of left buttock, stage 2: Secondary | ICD-10-CM | POA: Diagnosis not present

## 2018-09-30 DIAGNOSIS — L7632 Postprocedural hematoma of skin and subcutaneous tissue following other procedure: Secondary | ICD-10-CM | POA: Diagnosis not present

## 2018-09-30 DIAGNOSIS — L89312 Pressure ulcer of right buttock, stage 2: Secondary | ICD-10-CM | POA: Diagnosis not present

## 2018-09-30 DIAGNOSIS — T81718D Complication of other artery following a procedure, not elsewhere classified, subsequent encounter: Secondary | ICD-10-CM | POA: Diagnosis not present

## 2018-09-30 DIAGNOSIS — L89323 Pressure ulcer of left buttock, stage 3: Secondary | ICD-10-CM | POA: Diagnosis not present

## 2018-09-30 DIAGNOSIS — L89150 Pressure ulcer of sacral region, unstageable: Secondary | ICD-10-CM | POA: Diagnosis not present

## 2018-09-30 MED ORDER — IPRATROPIUM-ALBUTEROL 0.5-2.5 (3) MG/3ML IN SOLN: 3.0000 mL | Freq: Four times a day (QID) | RESPIRATORY_TRACT | 3 refills | Status: AC | PRN

## 2018-09-30 NOTE — Telephone Encounter (Signed)
Erx for Duoneb sent in. Informed Joelene Millin of same.

## 2018-09-30 NOTE — Telephone Encounter (Signed)
Copied from [place] [id number]. Topic: General - Other >> Sep 30, 2018  3:14 PM Rainey Pines A wrote: Joelene Millin from [facility] called and stated that patient has some duoneb that expired and wants to know if a new order can be placed or if anything different can be done for patient. Nurse stated patients oxygen level was 88% and her lungs sound clear.  [phone]

## 2018-10-03 ENCOUNTER — Other Ambulatory Visit: Payer: Self-pay | Admitting: *Deleted

## 2018-10-03 DIAGNOSIS — L89322 Pressure ulcer of left buttock, stage 2: Secondary | ICD-10-CM | POA: Diagnosis not present

## 2018-10-03 DIAGNOSIS — L7632 Postprocedural hematoma of skin and subcutaneous tissue following other procedure: Secondary | ICD-10-CM | POA: Diagnosis not present

## 2018-10-03 DIAGNOSIS — L89150 Pressure ulcer of sacral region, unstageable: Secondary | ICD-10-CM | POA: Diagnosis not present

## 2018-10-03 DIAGNOSIS — L89312 Pressure ulcer of right buttock, stage 2: Secondary | ICD-10-CM | POA: Diagnosis not present

## 2018-10-03 DIAGNOSIS — T81718D Complication of other artery following a procedure, not elsewhere classified, subsequent encounter: Secondary | ICD-10-CM | POA: Diagnosis not present

## 2018-10-03 DIAGNOSIS — L89323 Pressure ulcer of left buttock, stage 3: Secondary | ICD-10-CM | POA: Diagnosis not present

## 2018-10-03 DIAGNOSIS — L89314 Pressure ulcer of right buttock, stage 4: Secondary | ICD-10-CM | POA: Diagnosis not present

## 2018-10-03 DIAGNOSIS — R4701 Aphasia: Secondary | ICD-10-CM | POA: Diagnosis not present

## 2018-10-03 DIAGNOSIS — Z86718 Personal history of other venous thrombosis and embolism: Secondary | ICD-10-CM | POA: Diagnosis not present

## 2018-10-03 MED ORDER — HYDROXYUREA 500 MG PO CAPS: ORAL_CAPSULE | ORAL | 0 refills | Status: DC

## 2018-10-05 ENCOUNTER — Other Ambulatory Visit: Payer: Self-pay | Admitting: Internal Medicine

## 2018-10-05 ENCOUNTER — Ambulatory Visit (INDEPENDENT_AMBULATORY_CARE_PROVIDER_SITE_OTHER): Payer: Medicare Other | Admitting: General Practice

## 2018-10-05 ENCOUNTER — Encounter: Payer: Self-pay | Admitting: Internal Medicine

## 2018-10-05 DIAGNOSIS — L7632 Postprocedural hematoma of skin and subcutaneous tissue following other procedure: Secondary | ICD-10-CM | POA: Diagnosis not present

## 2018-10-05 DIAGNOSIS — L89322 Pressure ulcer of left buttock, stage 2: Secondary | ICD-10-CM | POA: Diagnosis not present

## 2018-10-05 DIAGNOSIS — L89150 Pressure ulcer of sacral region, unstageable: Secondary | ICD-10-CM | POA: Diagnosis not present

## 2018-10-05 DIAGNOSIS — T81718D Complication of other artery following a procedure, not elsewhere classified, subsequent encounter: Secondary | ICD-10-CM | POA: Diagnosis not present

## 2018-10-05 DIAGNOSIS — Z7901 Long term (current) use of anticoagulants: Secondary | ICD-10-CM

## 2018-10-05 DIAGNOSIS — L89312 Pressure ulcer of right buttock, stage 2: Secondary | ICD-10-CM | POA: Diagnosis not present

## 2018-10-05 DIAGNOSIS — L89323 Pressure ulcer of left buttock, stage 3: Secondary | ICD-10-CM | POA: Diagnosis not present

## 2018-10-05 LAB — POCT INR: INR: 2.6 (ref 2.0–3.0)

## 2018-10-05 NOTE — Progress Notes (Signed)
I have reviewed the results and agree with this plan   

## 2018-10-05 NOTE — Patient Instructions (Signed)
Pre visit review using our clinic review tool, if applicable. No additional management support is needed unless otherwise documented below in the visit note.  Continue to take 1/2 tablet daily except 1 tablet on Mon Wed and Fridays. Dosing instructions given to  [facility], RN @ [facility] [phone].  Re-check in 4 weeks.

## 2018-10-07 ENCOUNTER — Other Ambulatory Visit: Payer: Self-pay | Admitting: Hematology and Oncology

## 2018-10-07 DIAGNOSIS — G43909 Migraine, unspecified, not intractable, without status migrainosus: Secondary | ICD-10-CM | POA: Diagnosis not present

## 2018-10-07 DIAGNOSIS — L89323 Pressure ulcer of left buttock, stage 3: Secondary | ICD-10-CM | POA: Diagnosis not present

## 2018-10-07 DIAGNOSIS — Z87440 Personal history of urinary (tract) infections: Secondary | ICD-10-CM | POA: Diagnosis not present

## 2018-10-07 DIAGNOSIS — Z7401 Bed confinement status: Secondary | ICD-10-CM | POA: Diagnosis not present

## 2018-10-07 DIAGNOSIS — Z9981 Dependence on supplemental oxygen: Secondary | ICD-10-CM | POA: Diagnosis not present

## 2018-10-07 DIAGNOSIS — I69390 Apraxia following cerebral infarction: Secondary | ICD-10-CM | POA: Diagnosis not present

## 2018-10-07 DIAGNOSIS — H903 Sensorineural hearing loss, bilateral: Secondary | ICD-10-CM | POA: Diagnosis not present

## 2018-10-07 DIAGNOSIS — Z86718 Personal history of other venous thrombosis and embolism: Secondary | ICD-10-CM | POA: Diagnosis not present

## 2018-10-07 DIAGNOSIS — G629 Polyneuropathy, unspecified: Secondary | ICD-10-CM | POA: Diagnosis not present

## 2018-10-07 DIAGNOSIS — R1310 Dysphagia, unspecified: Secondary | ICD-10-CM | POA: Diagnosis not present

## 2018-10-07 DIAGNOSIS — D473 Essential (hemorrhagic) thrombocythemia: Secondary | ICD-10-CM | POA: Diagnosis not present

## 2018-10-07 DIAGNOSIS — I69391 Dysphagia following cerebral infarction: Secondary | ICD-10-CM | POA: Diagnosis not present

## 2018-10-07 DIAGNOSIS — I69320 Aphasia following cerebral infarction: Secondary | ICD-10-CM | POA: Diagnosis not present

## 2018-10-07 DIAGNOSIS — J9611 Chronic respiratory failure with hypoxia: Secondary | ICD-10-CM | POA: Diagnosis not present

## 2018-10-07 DIAGNOSIS — I69351 Hemiplegia and hemiparesis following cerebral infarction affecting right dominant side: Secondary | ICD-10-CM | POA: Diagnosis not present

## 2018-10-07 DIAGNOSIS — Z7901 Long term (current) use of anticoagulants: Secondary | ICD-10-CM | POA: Diagnosis not present

## 2018-10-07 DIAGNOSIS — E0520 Thyrotoxicosis with toxic multinodular goiter without thyrotoxic crisis or storm: Secondary | ICD-10-CM | POA: Diagnosis not present

## 2018-10-07 DIAGNOSIS — M150 Primary generalized (osteo)arthritis: Secondary | ICD-10-CM | POA: Diagnosis not present

## 2018-10-07 DIAGNOSIS — L89314 Pressure ulcer of right buttock, stage 4: Secondary | ICD-10-CM | POA: Diagnosis not present

## 2018-10-07 DIAGNOSIS — Z86711 Personal history of pulmonary embolism: Secondary | ICD-10-CM | POA: Diagnosis not present

## 2018-10-07 DIAGNOSIS — Z4800 Encounter for change or removal of nonsurgical wound dressing: Secondary | ICD-10-CM | POA: Diagnosis not present

## 2018-10-08 DIAGNOSIS — I69351 Hemiplegia and hemiparesis following cerebral infarction affecting right dominant side: Secondary | ICD-10-CM | POA: Diagnosis not present

## 2018-10-08 DIAGNOSIS — L89314 Pressure ulcer of right buttock, stage 4: Secondary | ICD-10-CM | POA: Diagnosis not present

## 2018-10-08 DIAGNOSIS — E0520 Thyrotoxicosis with toxic multinodular goiter without thyrotoxic crisis or storm: Secondary | ICD-10-CM | POA: Diagnosis not present

## 2018-10-08 DIAGNOSIS — G629 Polyneuropathy, unspecified: Secondary | ICD-10-CM | POA: Diagnosis not present

## 2018-10-08 DIAGNOSIS — Z4800 Encounter for change or removal of nonsurgical wound dressing: Secondary | ICD-10-CM | POA: Diagnosis not present

## 2018-10-08 DIAGNOSIS — L89323 Pressure ulcer of left buttock, stage 3: Secondary | ICD-10-CM | POA: Diagnosis not present

## 2018-10-10 ENCOUNTER — Telehealth: Payer: Self-pay | Admitting: Internal Medicine

## 2018-10-10 DIAGNOSIS — Z4800 Encounter for change or removal of nonsurgical wound dressing: Secondary | ICD-10-CM | POA: Diagnosis not present

## 2018-10-10 DIAGNOSIS — L89314 Pressure ulcer of right buttock, stage 4: Secondary | ICD-10-CM | POA: Diagnosis not present

## 2018-10-10 DIAGNOSIS — L89323 Pressure ulcer of left buttock, stage 3: Secondary | ICD-10-CM | POA: Diagnosis not present

## 2018-10-10 DIAGNOSIS — E0520 Thyrotoxicosis with toxic multinodular goiter without thyrotoxic crisis or storm: Secondary | ICD-10-CM | POA: Diagnosis not present

## 2018-10-10 DIAGNOSIS — G629 Polyneuropathy, unspecified: Secondary | ICD-10-CM | POA: Diagnosis not present

## 2018-10-10 DIAGNOSIS — I69351 Hemiplegia and hemiparesis following cerebral infarction affecting right dominant side: Secondary | ICD-10-CM | POA: Diagnosis not present

## 2018-10-10 NOTE — Telephone Encounter (Signed)
Gave ok for orders per Dr. Burns.  

## 2018-10-10 NOTE — Telephone Encounter (Signed)
Home Health Verbal Orders - Caller/Agency: [facility] Number: [phone] (can leave a voicemail) Requesting OT/PT/Skilled Nursing/Social Work/Speech Therapy: Physical Therapy Frequency:  1 week 1 2 week 7 1 week 1

## 2018-10-12 ENCOUNTER — Telehealth: Payer: Self-pay | Admitting: Internal Medicine

## 2018-10-12 DIAGNOSIS — L89323 Pressure ulcer of left buttock, stage 3: Secondary | ICD-10-CM | POA: Diagnosis not present

## 2018-10-12 DIAGNOSIS — I69320 Aphasia following cerebral infarction: Secondary | ICD-10-CM | POA: Diagnosis not present

## 2018-10-12 DIAGNOSIS — G629 Polyneuropathy, unspecified: Secondary | ICD-10-CM

## 2018-10-12 DIAGNOSIS — H903 Sensorineural hearing loss, bilateral: Secondary | ICD-10-CM

## 2018-10-12 DIAGNOSIS — L89314 Pressure ulcer of right buttock, stage 4: Secondary | ICD-10-CM | POA: Diagnosis not present

## 2018-10-12 DIAGNOSIS — I69390 Apraxia following cerebral infarction: Secondary | ICD-10-CM

## 2018-10-12 DIAGNOSIS — Z9981 Dependence on supplemental oxygen: Secondary | ICD-10-CM

## 2018-10-12 DIAGNOSIS — J9611 Chronic respiratory failure with hypoxia: Secondary | ICD-10-CM

## 2018-10-12 DIAGNOSIS — M150 Primary generalized (osteo)arthritis: Secondary | ICD-10-CM

## 2018-10-12 DIAGNOSIS — D473 Essential (hemorrhagic) thrombocythemia: Secondary | ICD-10-CM

## 2018-10-12 DIAGNOSIS — Z7901 Long term (current) use of anticoagulants: Secondary | ICD-10-CM

## 2018-10-12 DIAGNOSIS — Z4800 Encounter for change or removal of nonsurgical wound dressing: Secondary | ICD-10-CM | POA: Diagnosis not present

## 2018-10-12 DIAGNOSIS — R1310 Dysphagia, unspecified: Secondary | ICD-10-CM

## 2018-10-12 DIAGNOSIS — Z7401 Bed confinement status: Secondary | ICD-10-CM

## 2018-10-12 DIAGNOSIS — Z86718 Personal history of other venous thrombosis and embolism: Secondary | ICD-10-CM

## 2018-10-12 DIAGNOSIS — E0520 Thyrotoxicosis with toxic multinodular goiter without thyrotoxic crisis or storm: Secondary | ICD-10-CM | POA: Diagnosis not present

## 2018-10-12 DIAGNOSIS — G43909 Migraine, unspecified, not intractable, without status migrainosus: Secondary | ICD-10-CM

## 2018-10-12 DIAGNOSIS — Z86711 Personal history of pulmonary embolism: Secondary | ICD-10-CM

## 2018-10-12 DIAGNOSIS — I69351 Hemiplegia and hemiparesis following cerebral infarction affecting right dominant side: Secondary | ICD-10-CM

## 2018-10-12 DIAGNOSIS — Z87440 Personal history of urinary (tract) infections: Secondary | ICD-10-CM

## 2018-10-12 DIAGNOSIS — I69391 Dysphagia following cerebral infarction: Secondary | ICD-10-CM

## 2018-10-12 NOTE — Telephone Encounter (Signed)
She has a known large right anterior abdominal wall hernia.  First discussed with daughter and see if this is new because she would know better than anyone.  If so she probably needs to come in for an appointment so that we can examine her and the only way to evaluate this might be through a CT scan.  The daughter wants to go ahead with that and schedule.

## 2018-10-12 NOTE — Telephone Encounter (Signed)
[facility] calling Deatra Canter calling CB necessary at [phone]. During her examination this am found a large hard mass in left lower quadriad of ab area. Had thought something before but waited to make sure exam after sure not before BM . Wants to make sure you get this message pls FU

## 2018-10-13 NOTE — Telephone Encounter (Signed)
Spoke with Dyann Ruddle from [facility] as well as [name] the pts daughter. The place in her abdomen is on her left side. I have scheduled an appointment with you at 3pm on Tuesday. She also wanted to let you know that pt has had R breast pain. This has been added to the note for her appointment.

## 2018-10-14 DIAGNOSIS — L89314 Pressure ulcer of right buttock, stage 4: Secondary | ICD-10-CM | POA: Diagnosis not present

## 2018-10-14 DIAGNOSIS — I69351 Hemiplegia and hemiparesis following cerebral infarction affecting right dominant side: Secondary | ICD-10-CM | POA: Diagnosis not present

## 2018-10-14 DIAGNOSIS — Z4800 Encounter for change or removal of nonsurgical wound dressing: Secondary | ICD-10-CM | POA: Diagnosis not present

## 2018-10-14 DIAGNOSIS — E0520 Thyrotoxicosis with toxic multinodular goiter without thyrotoxic crisis or storm: Secondary | ICD-10-CM | POA: Diagnosis not present

## 2018-10-14 DIAGNOSIS — L89323 Pressure ulcer of left buttock, stage 3: Secondary | ICD-10-CM | POA: Diagnosis not present

## 2018-10-14 DIAGNOSIS — G629 Polyneuropathy, unspecified: Secondary | ICD-10-CM | POA: Diagnosis not present

## 2018-10-16 ENCOUNTER — Other Ambulatory Visit: Payer: Self-pay | Admitting: Internal Medicine

## 2018-10-17 ENCOUNTER — Telehealth: Payer: Self-pay | Admitting: Internal Medicine

## 2018-10-17 DIAGNOSIS — I69351 Hemiplegia and hemiparesis following cerebral infarction affecting right dominant side: Secondary | ICD-10-CM | POA: Diagnosis not present

## 2018-10-17 DIAGNOSIS — G629 Polyneuropathy, unspecified: Secondary | ICD-10-CM | POA: Diagnosis not present

## 2018-10-17 DIAGNOSIS — E0520 Thyrotoxicosis with toxic multinodular goiter without thyrotoxic crisis or storm: Secondary | ICD-10-CM | POA: Diagnosis not present

## 2018-10-17 DIAGNOSIS — L89314 Pressure ulcer of right buttock, stage 4: Secondary | ICD-10-CM | POA: Diagnosis not present

## 2018-10-17 DIAGNOSIS — L89323 Pressure ulcer of left buttock, stage 3: Secondary | ICD-10-CM | POA: Diagnosis not present

## 2018-10-17 DIAGNOSIS — Z4800 Encounter for change or removal of nonsurgical wound dressing: Secondary | ICD-10-CM | POA: Diagnosis not present

## 2018-10-17 NOTE — Telephone Encounter (Signed)
Patient called to advise that her pharmacy advised that the refill for Methimazole was declined

## 2018-10-17 NOTE — Telephone Encounter (Signed)
She is also seeing endocrinology at [facility].  I would prefer that they refill this for her, since there is no need to see me for now.

## 2018-10-17 NOTE — Telephone Encounter (Signed)
[place] Controlled Database Checked Last filled: 09/12/18 #180 LOV w/you: 08/05/18 Next appt w/you: 10/18/18

## 2018-10-17 NOTE — Progress Notes (Signed)
Subjective:    Patient ID: Sandy Barrett, female    DOB: 14-Dec-1932, 85 y.o.   MRN: [id number]  HPI The patient is here for an acute visit.  She is here with her daughter.    Abdominal lump on left side of her lower abdomen: 1 of the nurses that came out noticed a lump or swelling in her left lower quadrant.  Her daughter and the different nurse had noticed this at times, but it seemed to come and go and she thought maybe it was stool.  Her mother is dysarthric and not able to communicate as well and therefore she is unsure if it bothers her, but there is no obvious signs that it does bother her.  She does have an abdominal hernia on the right side and she does grab that on occasion.  She states she has a bowel movement every other day approximately.  Right breast pain:  She saw surgery 05/2015 and had a biopsy of the breast to rule out inflammatory breast disease/cancer.  The biopsy showed lymphedema, no cancer.  It is smaller than it has been.  It does bother her-her daughter can tell that it bothers her because she does not like a compressive bra on and if it is in certain positions she will grab at it like it is uncomfortable.  She does have history of a very large goiter and had embolization earlier this year.  She does have known SVC syndrome, which is likely the cause of her lymphedema in the right breast and arm.  She has had lymphedema treatments by home health and her daughter has done it, but she has not done as much recently because she was concerned that it was causing her mother discomfort.    Muscle spams in right lower leg: Her daughter has noticed that she is having spasms in the right lower leg sometimes when she is laying in bed.  She can feel the muscle spasm.  She does massage it and that seems to help a little.  She was wondering what else she can do.  That is the leg affected by her stroke.    Medications and allergies reviewed with patient and updated if appropriate.   Patient Active Problem List   Diagnosis Date Noted  . Hematoma of arm, left, initial encounter 07/29/2018  . Sleep difficulties 05/24/2018  . Eye discomfort, right 03/09/2018  . Right shoulder pain 11/24/2017  . Hallucinations 11/24/2017  . Adhesive capsulitis of right shoulder 11/23/2017  . Bilateral leg edema 05/24/2017  . Cholecystitis 02/23/2017  . Chronic respiratory failure with hypoxia ([place]) 02/23/2017  . Neuralgia 01/09/2017  . Venous congestion 10/08/2016  . Lymphedema of breast 09/11/2016  . Long term (current) use of anticoagulants [Z79.01] 09/10/2016  . Left hip pain 08/09/2016  . Left leg swelling 05/18/2016  . Pressure ulcer 04/27/2016  . Aspiration pneumonia ([place]) 04/20/2016  . Hemidiaphragm paralysis   . Wheelchair bound 02/03/2016  . Discoloration of skin 09/26/2015  . Essential thrombocytosis ([place]) 07/31/2015  . SVC (superior vena cava obstruction), chronic 07/20/2015  . Dysphagia 07/20/2015  . Constipation 07/20/2015  . Thrombophlebitis of breast, right 06/18/2015  . Tracheal deviation 06/06/2015  . Sensorineural hearing loss, bilateral, moderate-moderately severe 04/30/2015  . Abdominal wall lump 03/09/2015  . Frequent UTI 02/06/2015  . Primary osteoarthritis involving multiple joints 07/26/2014  . Pernicious anemia 07/26/2014  . Encounter for therapeutic drug monitoring 07/17/2014  . Spastic hemiparesis affecting dominant side ([place]), right 05/31/2014  .  DVT of lower extremity, bilateral ([place]) 03/14/2014  . Global aphasia 03/14/2014  . 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 Aphasia S/P CVA 03/13/2014  . Cerebral infarction due to embolism of left middle cerebral artery ([place])   . Stroke, embolic ([place]) [id number]  . History of pulmonary embolism 03/08/2014  . Primary localized osteoarthrosis, lower leg 03/06/2013  . IBS (irritable bowel syndrome)   . Multinodular goiter 01/13/2011  . Hyperthyroidism 11/11/2009  . Osteoarthritis 04/21/2008  . Migraine  headache 04/20/2008    Current Outpatient Medications on File Prior to Visit  Medication Sig Dispense Refill  . acetaminophen (TYLENOL) 160 MG/5ML liquid Take 480 mg by mouth every 4 (four) hours as needed for fever or pain.    . calamine lotion Apply 1 application as needed topically for itching. 120 mL 0  . chlorhexidine (PERIDEX) 0.12 % solution RINSE TWICE A DAY FOR 1 MINUTE  5  . ciclopirox (PENLAC) 8 % solution APPLY TOPICALLY AT BEDTIME OVER NAIL & SURROUNDING SKIN,APPLY DAILY OVER PREVIOUS COAT,AFTER 7 DAYS REMOVE WITH ALCOHOL AND CONTINUE CYCLE 6.6 mL 5  . diclofenac sodium (VOLTAREN) 1 % GEL APPLY 2 G TOPICALLY 3 (THREE) TIMES DAILY AS NEEDED. 300 g 0  . hydroxyurea (HYDREA) 500 MG capsule TAKE 1 TABLET (500 MG) DAILY EXCEPT 2 TABLETS ( 1000 MG ) ON WEDNESDAY, SATURDAY AND SUNDAY 360 capsule 1  . ipratropium-albuterol (DUONEB) 0.5-2.5 (3) MG/3ML SOLN Take 3 mLs by nebulization every 6 (six) hours as needed (shortness of breath). 360 mL 3  . lidocaine (LIDODERM) 5 % Place 1 patch daily onto the skin. Remove & Discard patch within 12 hours or as directed by MD (Patient taking differently: Place 1 patch onto the skin daily as needed (pain). Remove & Discard patch within 12 hours or as directed by MD) 15 patch 0  . methimazole (TAPAZOLE) 5 MG tablet TAKE 1 TABLET 2 TIMES A WEEK 8 tablet 4  . mupirocin cream (BACTROBAN) 2 % Apply 1 application topically 2 (two) times daily. 30 g 5  . NONFORMULARY OR COMPOUNDED ITEM Topical cream with equal parts diclofenac, gabapentin, lidocaine, menthol  Disp 100 gm 1 each 5  . Oral Hygiene Products (Q-CARE COVERD [place]ANKAUER/SUCTION) MISC Use as directed 10 each 5  . OXYGEN 2lpm 24/7  DME- [facility]    . pantoprazole sodium (PROTONIX) 40 mg/20 mL PACK Take 20 mLs (40 mg total) by mouth daily. 40 mL 3  . polyethylene glycol (MIRALAX / GLYCOLAX) packet TAKE 17G BY MOUTH TWICE A DAY (Patient taking differently: Take 17 g by mouth 2 (two) times daily as needed for  mild constipation. ) 14 packet 1  . pregabalin (LYRICA) 50 MG capsule TAKE 1 CAPSULE (50 MG TOTAL) BY MOUTH 3 (THREE) TIMES DAILY. 90 capsule 5  . Probiotic Product (ALIGN) 4 MG CAPS Take 4 mg by mouth daily.     . propranolol (INDERAL) 10 MG tablet TAKE 0.5 TABLETS (5 MG TOTAL) BY MOUTH 2 (TWO) TIMES DAILY. 60 tablet 2  . terconazole (TERAZOL 7) 0.4 % vaginal cream Place 1 applicator vaginally at bedtime. (Patient taking differently: Place 1 applicator vaginally at bedtime as needed (irritation). ) 90 g 3  . traMADol (ULTRAM) 50 MG tablet TAKE 2 TABLETS BY MOUTH 3 TIMES A DAY AS NEEDED 180 tablet 1  . traZODone (DESYREL) 50 MG tablet TAKE 1 TABLET (50 MG TOTAL) BY MOUTH AT BEDTIME AS NEEDED. FOR SLEEP 90 tablet 1  . warfarin (COUMADIN) 4 MG tablet TAKE  1 TABLET DAILY EXCEPT 1/2 ON SUNDAY AND THURSDAYS OR TAKE AS DIRECTED BY ANTICOAGULATION CLINIC. 90 DAY 90 tablet 1   No current facility-administered medications on file prior to visit.     Past Medical History:  Diagnosis Date  . ARTHRITIS   . Arthritis   . Diverticulosis   . Essential thrombocytosis ([place])   . HYPERTENSION   . HYPERTHYROIDISM   . Hyperthyroidism    s/p I-131 ablation 03/2011 of multinod goiter  . INSOMNIA   . MIGRAINE HEADACHE   . OBESITY   . Posttraumatic stress disorder   . Pulmonary embolism ([place]) 03/2014   with DVT  . Stroke [facility]) 03/2014   dysarthria    Past Surgical History:  Procedure Laterality Date  . ABDOMINAL HYSTERECTOMY  1976  . BREAST SURGERY     biopsy  . RADIOLOGY WITH ANESTHESIA Left 03/08/2014   Procedure: RADIOLOGY WITH ANESTHESIA;  Surgeon: Rob Hickman, MD;  Location: [facility];  Service: Radiology;  Laterality: Left;  . TONSILLECTOMY    . TOTAL HIP ARTHROPLASTY  1998    right    Social History   Socioeconomic History  . Marital status: Widowed    Spouse name: Not on file  . Number of children: 2  . Years of education: 60  . Highest education level: Not on file  Occupational  History  . Occupation: retired    Comment: Restaurant manager, fast food  Social Needs  . Financial resource strain: Not on file  . Food insecurity    Worry: Not on file    Inability: Not on file  . Transportation needs    Medical: Not on file    Non-medical: Not on file  Tobacco Use  . Smoking status: Never Smoker  . Smokeless tobacco: Never Used  Substance and Sexual Activity  . Alcohol use: No    Alcohol/week: 0.0 standard drinks  . Drug use: No  . Sexual activity: Never  Lifestyle  . Physical activity    Days per week: Not on file    Minutes per session: Not on file  . Stress: Not on file  Relationships  . Social Herbalist on phone: Not on file    Gets together: Not on file    Attends religious service: Not on file    Active member of club or organization: Not on file    Attends meetings of clubs or organizations: Not on file    Relationship status: Not on file  Other Topics Concern  . Not on file  Social History Narrative   Widowed, lived alone prior to CVA 03/2014   SNF at [facility], then home 07/2014 with dtr   Right handed   Caffeine use- occasionally drinks tea    Family History  Problem Relation Age of Onset  . Asthma Mother   . Asthma Father   . Prostate cancer Father   . Stroke Brother   . Breast cancer Sister   . Stomach cancer Sister   . Thyroid disease Neg Hx     Review of Systems  Unable to perform ROS: Patient nonverbal       Objective:   Vitals:   10/18/18 1514  BP: 116/62  Resp: 16  Temp: 97.8 F (36.6 C)   BP Readings from Last 3 Encounters:  10/18/18 116/62  09/27/18 110/70  08/30/18 114/77   Wt Readings from Last 3 Encounters:  08/30/18 176 lb (79.8 kg)  05/24/18 175 lb (79.4 kg)  01/24/18 178 lb (80.7 kg)   Body mass index is 34.37 kg/m.   Physical Exam Constitutional:      General: She is not in acute distress.    Appearance: Normal appearance. She is not ill-appearing.  Chest:     Comments: Right breast  much larger than left.  There is lymphedema present in the right breast and it is tender to touch, especially in the dependent areas. Abdominal:     Palpations: Abdomen is soft.     Comments: Abdominal exam is difficult-she is in a wheelchair and and we are not able to get her up on the table to examine her abdomen while laying down.  No obvious tenderness with palpation throughout abdomen.  Right lower quadrant hernia.  Mid left abdomen there is a palpable lump-it is very difficult to discern what this is.  It does not seem to be tender.  No obvious mass or lump in left lower quadrant, but exam is not adequate to fully evaluate this area  Skin:    General: Skin is warm and dry.  Neurological:     Mental Status: She is alert.            Assessment & Plan:    See Problem List for Assessment and Plan of chronic medical problems.

## 2018-10-17 NOTE — Telephone Encounter (Signed)
Sandy Kingdom, MD to Me     9:50 AM Note   She is also seeing endocrinology at [facility].  I would prefer that they refill this for her, since there is no need to see me for now.

## 2018-10-17 NOTE — Telephone Encounter (Signed)
Last office visit 01/11/2017   Cancel/No-show? 1  Future office visit scheduled? none  Please advise on refill.

## 2018-10-18 ENCOUNTER — Other Ambulatory Visit: Payer: Self-pay

## 2018-10-18 ENCOUNTER — Encounter: Payer: Self-pay | Admitting: Internal Medicine

## 2018-10-18 ENCOUNTER — Ambulatory Visit (INDEPENDENT_AMBULATORY_CARE_PROVIDER_SITE_OTHER): Payer: Medicare Other | Admitting: Internal Medicine

## 2018-10-18 ENCOUNTER — Telehealth: Payer: Self-pay | Admitting: Internal Medicine

## 2018-10-18 VITALS — BP 116/62 | Temp 97.8°F | Resp 16 | Ht 60.0 in

## 2018-10-18 DIAGNOSIS — R222 Localized swelling, mass and lump, trunk: Secondary | ICD-10-CM

## 2018-10-18 DIAGNOSIS — Z23 Encounter for immunization: Secondary | ICD-10-CM | POA: Diagnosis not present

## 2018-10-18 DIAGNOSIS — N63 Unspecified lump in unspecified breast: Secondary | ICD-10-CM | POA: Diagnosis not present

## 2018-10-18 DIAGNOSIS — R1907 Generalized intra-abdominal and pelvic swelling, mass and lump: Secondary | ICD-10-CM | POA: Diagnosis not present

## 2018-10-18 DIAGNOSIS — I890 Lymphedema, not elsewhere classified: Secondary | ICD-10-CM | POA: Diagnosis not present

## 2018-10-18 NOTE — Assessment & Plan Note (Signed)
Continues to have right breast lymphedema and discomfort This is chronic Her daughter thinks the breast may be slightly smaller than it was in the past Lymphedema treatments have helped Likely cause is SVC syndrome secondary to goiter-she had embolization of the goiter earlier this year with 25% reduction Recommended increasing lymphedema treatments and supportive treatment for comfort

## 2018-10-18 NOTE — Patient Instructions (Signed)
A CT scan was ordered for your abdomen/pelvis.   Increase the lymphedema treatments of the breast.

## 2018-10-18 NOTE — Assessment & Plan Note (Signed)
She is a right lower abdominal hernia, that has been there for a while Home nurse and daughter have seen the left lower quadrant mass or lump-does not seem to be causing her any discomfort, but she is nonverbal and it is difficult to know for sure Exam here is limited because she is wheelchair-bound and we are not able to get her on the table There is a right mid quadrant lump that is palpable-does not appear to be painful Left lower quadrant and left mid quadrant warrant further evaluation CT of the abdomen pelvis ordered

## 2018-10-18 NOTE — Telephone Encounter (Signed)
Gave ok for orders per Dr. Burns.  

## 2018-10-18 NOTE — Telephone Encounter (Signed)
Home Health Verbal Orders - Caller/Agency: [facility] Number: [phone] (can leave a voicemail) Requesting OT/PT/Skilled Nursing/Social Work/Speech Therapy: PT Frequency:  2x a week for 4 weeks 1x a week for 2 weeks For caregiver education on optimal positioning, therapeutic exercise, passive range of motion and establishment of home exercise program

## 2018-10-19 DIAGNOSIS — Z4800 Encounter for change or removal of nonsurgical wound dressing: Secondary | ICD-10-CM | POA: Diagnosis not present

## 2018-10-19 DIAGNOSIS — G629 Polyneuropathy, unspecified: Secondary | ICD-10-CM | POA: Diagnosis not present

## 2018-10-19 DIAGNOSIS — I69351 Hemiplegia and hemiparesis following cerebral infarction affecting right dominant side: Secondary | ICD-10-CM | POA: Diagnosis not present

## 2018-10-19 DIAGNOSIS — L89314 Pressure ulcer of right buttock, stage 4: Secondary | ICD-10-CM | POA: Diagnosis not present

## 2018-10-19 DIAGNOSIS — E0520 Thyrotoxicosis with toxic multinodular goiter without thyrotoxic crisis or storm: Secondary | ICD-10-CM | POA: Diagnosis not present

## 2018-10-19 DIAGNOSIS — L89323 Pressure ulcer of left buttock, stage 3: Secondary | ICD-10-CM | POA: Diagnosis not present

## 2018-10-20 ENCOUNTER — Ambulatory Visit: Payer: Medicare Other | Admitting: Hematology and Oncology

## 2018-10-20 ENCOUNTER — Other Ambulatory Visit: Payer: Medicare Other

## 2018-10-21 ENCOUNTER — Telehealth: Payer: Self-pay | Admitting: *Deleted

## 2018-10-21 ENCOUNTER — Other Ambulatory Visit: Payer: Self-pay | Admitting: Hematology and Oncology

## 2018-10-21 DIAGNOSIS — L89314 Pressure ulcer of right buttock, stage 4: Secondary | ICD-10-CM | POA: Diagnosis not present

## 2018-10-21 DIAGNOSIS — L89323 Pressure ulcer of left buttock, stage 3: Secondary | ICD-10-CM | POA: Diagnosis not present

## 2018-10-21 DIAGNOSIS — I69351 Hemiplegia and hemiparesis following cerebral infarction affecting right dominant side: Secondary | ICD-10-CM | POA: Diagnosis not present

## 2018-10-21 DIAGNOSIS — E0520 Thyrotoxicosis with toxic multinodular goiter without thyrotoxic crisis or storm: Secondary | ICD-10-CM | POA: Diagnosis not present

## 2018-10-21 DIAGNOSIS — G629 Polyneuropathy, unspecified: Secondary | ICD-10-CM | POA: Diagnosis not present

## 2018-10-21 DIAGNOSIS — D473 Essential (hemorrhagic) thrombocythemia: Secondary | ICD-10-CM

## 2018-10-21 DIAGNOSIS — Z4800 Encounter for change or removal of nonsurgical wound dressing: Secondary | ICD-10-CM | POA: Diagnosis not present

## 2018-10-21 NOTE — Telephone Encounter (Signed)
Note placed in the appointment for guest, [name] to accompany patient. [name] notified and confirms appt.

## 2018-10-21 NOTE — Telephone Encounter (Signed)
I think her daughter should come in with her I will see her next week as scheduled She should not postpone her appt due to recent abnormal CBC

## 2018-10-21 NOTE — Telephone Encounter (Signed)
[name] called to inquire about coming in with patient for her visit. Patient has aphasia and is not able to speak. Patient will need someone in the office with her. Is it ok to postpone this visit again?

## 2018-10-24 ENCOUNTER — Ambulatory Visit (INDEPENDENT_AMBULATORY_CARE_PROVIDER_SITE_OTHER): Payer: Medicare Other | Admitting: General Practice

## 2018-10-24 DIAGNOSIS — Z4800 Encounter for change or removal of nonsurgical wound dressing: Secondary | ICD-10-CM | POA: Diagnosis not present

## 2018-10-24 DIAGNOSIS — E0520 Thyrotoxicosis with toxic multinodular goiter without thyrotoxic crisis or storm: Secondary | ICD-10-CM | POA: Diagnosis not present

## 2018-10-24 DIAGNOSIS — L89323 Pressure ulcer of left buttock, stage 3: Secondary | ICD-10-CM | POA: Diagnosis not present

## 2018-10-24 DIAGNOSIS — I69351 Hemiplegia and hemiparesis following cerebral infarction affecting right dominant side: Secondary | ICD-10-CM | POA: Diagnosis not present

## 2018-10-24 DIAGNOSIS — G629 Polyneuropathy, unspecified: Secondary | ICD-10-CM | POA: Diagnosis not present

## 2018-10-24 DIAGNOSIS — L89314 Pressure ulcer of right buttock, stage 4: Secondary | ICD-10-CM | POA: Diagnosis not present

## 2018-10-24 DIAGNOSIS — Z7901 Long term (current) use of anticoagulants: Secondary | ICD-10-CM | POA: Diagnosis not present

## 2018-10-24 LAB — POCT INR: INR: 2.8 (ref 2.0–3.0)

## 2018-10-24 NOTE — Patient Instructions (Signed)
Pre visit review using our clinic review tool, if applicable. No additional management support is needed unless otherwise documented below in the visit note.  Continue to take 1/2 tablet daily except 1 tablet on Mon Wed and Fridays. Dosing instructions given to  [facility], RN @ [facility] [phone].  Re-check in 3 weeks.

## 2018-10-25 ENCOUNTER — Inpatient Hospital Stay (HOSPITAL_BASED_OUTPATIENT_CLINIC_OR_DEPARTMENT_OTHER): Payer: Medicare Other | Admitting: Hematology and Oncology

## 2018-10-25 ENCOUNTER — Other Ambulatory Visit: Payer: Self-pay

## 2018-10-25 ENCOUNTER — Telehealth: Payer: Self-pay

## 2018-10-25 ENCOUNTER — Inpatient Hospital Stay: Payer: Medicare Other | Attending: Hematology and Oncology

## 2018-10-25 ENCOUNTER — Encounter: Payer: Self-pay | Admitting: Hematology and Oncology

## 2018-10-25 ENCOUNTER — Other Ambulatory Visit: Payer: Self-pay | Admitting: Hematology and Oncology

## 2018-10-25 ENCOUNTER — Telehealth: Payer: Self-pay | Admitting: *Deleted

## 2018-10-25 DIAGNOSIS — D473 Essential (hemorrhagic) thrombocythemia: Secondary | ICD-10-CM | POA: Insufficient documentation

## 2018-10-25 DIAGNOSIS — E0520 Thyrotoxicosis with toxic multinodular goiter without thyrotoxic crisis or storm: Secondary | ICD-10-CM | POA: Insufficient documentation

## 2018-10-25 DIAGNOSIS — Z7901 Long term (current) use of anticoagulants: Secondary | ICD-10-CM | POA: Diagnosis not present

## 2018-10-25 DIAGNOSIS — E0590 Thyrotoxicosis, unspecified without thyrotoxic crisis or storm: Secondary | ICD-10-CM

## 2018-10-25 DIAGNOSIS — L89100 Pressure ulcer of unspecified part of back, unstageable: Secondary | ICD-10-CM | POA: Diagnosis not present

## 2018-10-25 DIAGNOSIS — Z86711 Personal history of pulmonary embolism: Secondary | ICD-10-CM | POA: Diagnosis not present

## 2018-10-25 LAB — CBC WITH DIFFERENTIAL/PLATELET
Abs Immature Granulocytes: 0.07 10*3/uL (ref 0.00–0.07)
Basophils Absolute: 0.1 10*3/uL (ref 0.0–0.1)
Basophils Relative: 1 %
Eosinophils Absolute: 0.3 10*3/uL (ref 0.0–0.5)
Eosinophils Relative: 2 %
HCT: 38.7 % (ref 36.0–46.0)
Hemoglobin: 11.9 g/dL — ABNORMAL LOW (ref 12.0–15.0)
Immature Granulocytes: 1 %
Lymphocytes Relative: 10 %
Lymphs Abs: 1.2 10*3/uL (ref 0.7–4.0)
MCH: 32.5 pg (ref 26.0–34.0)
MCHC: 30.7 g/dL (ref 30.0–36.0)
MCV: 105.7 fL — ABNORMAL HIGH (ref 80.0–100.0)
Monocytes Absolute: 0.6 10*3/uL (ref 0.1–1.0)
Monocytes Relative: 5 %
Neutro Abs: 10.2 10*3/uL — ABNORMAL HIGH (ref 1.7–7.7)
Neutrophils Relative %: 81 %
Platelets: 641 10*3/uL — ABNORMAL HIGH (ref 150–400)
RBC: 3.66 MIL/uL — ABNORMAL LOW (ref 3.87–5.11)
RDW: 17.8 % — ABNORMAL HIGH (ref 11.5–15.5)
WBC: 12.5 10*3/uL — ABNORMAL HIGH (ref 4.0–10.5)
nRBC: 0.2 % (ref 0.0–0.2)

## 2018-10-25 LAB — BASIC METABOLIC PANEL - CANCER CENTER ONLY
Anion gap: 13 (ref 5–15)
BUN: 15 mg/dL (ref 8–23)
CO2: 21 mmol/L — ABNORMAL LOW (ref 22–32)
Calcium: 8.6 mg/dL — ABNORMAL LOW (ref 8.9–10.3)
Chloride: 109 mmol/L (ref 98–111)
Creatinine: 0.48 mg/dL (ref 0.44–1.00)
GFR, Est AFR Am: >60 mL/min (ref >60)
GFR, Estimated: >60 mL/min (ref >60)
Glucose, Bld: 91 mg/dL (ref 70–99)
Potassium: 4.3 mmol/L (ref 3.5–5.1)
Sodium: 143 mmol/L (ref 135–145)

## 2018-10-25 LAB — T4, FREE: Free T4: 1.34 ng/dL — ABNORMAL HIGH (ref 0.61–1.12)

## 2018-10-25 NOTE — Assessment & Plan Note (Signed)
Per daughter request, TSH, T3 and free T4 levels were drawn and will forward the results to her physician

## 2018-10-25 NOTE — Telephone Encounter (Signed)
Sandy Barrett called requesting labs for [facility] to be drawn here. She needs a T3, T4 and TSH drawn with today's labs. She is asking to have it done today so she does not have to take her to another office for labs to be completed and get stuck again.

## 2018-10-25 NOTE — Assessment & Plan Note (Signed)
Her CBC showed elevated WBC and platelets, likely due to reactive process from her decubitus ulcer I do not plan to change her Hydrea dosing as higher dose may cause more pancytopenia and may impair wound healing For now, she will continue same dose of Hydrea as prescribed I plan to recheck her lab work again in 6 months 

## 2018-10-25 NOTE — Progress Notes (Signed)
[facility] OFFICE PROGRESS NOTE  Patient Care Team: [name], MD as PCP - General (Internal Medicine) Eunice Blase, MD as Consulting Physician (Family Medicine) Renato Shin, MD as Consulting Physician (Endocrinology) Netta Cedars, MD as Consulting Physician (Orthopedic Surgery) Lyndal Pulley, DO (Sports Medicine) Garvin Fila, MD (Neurology)  ASSESSMENT & PLAN:  Essential thrombocytosis [facility]) Her CBC showed elevated WBC and platelets, likely due to reactive process from her decubitus ulcer I do not plan to change her Hydrea dosing as higher dose may cause more pancytopenia and may impair wound healing For now, she will continue same dose of Hydrea as prescribed I plan to recheck her lab work again in 6 months  History of pulmonary embolism Due to her high risk disease, she will continue anticoagulation therapy indefinitely She will continue INR monitoring through her primary care doctor. Yesterday, her INR is stable and she has no bleeding complications  Decubitus ulcer of back, unstageable ([place]) According to her daughter, she has 3 decubitus ulcers that are healing slowly She goes to wound care clinic on a regular basis I suspect this is causing mild reactive leukocytosis and thrombocytosis; this is not conclusive for infection, however I will defer to them for further management  Hyperthyroidism Per daughter request, TSH, T3 and free T4 levels were drawn and will forward the results to her physician   No orders of the defined types were placed in this encounter.   INTERVAL HISTORY: Please see below for problem oriented charting. Her daughter is available with her today  Due to her expressive dysphasia, history is not possible I have reviewed her chart extensively; she developed decubitus ulcers since a few months ago and they are healing very slowly The patient denies any recent signs or symptoms of bleeding such as spontaneous epistaxis,  hematuria or hematochezia from her anticoagulation therapy Her daughter requested thyroid levels to be drawn and BMP in anticipation for CT imaging that was ordered by her primary doctor No recent reported fever or chills  SUMMARY OF ONCOLOGIC HISTORY: Oncology History  Essential thrombocytosis ([place])  08/07/2013 Miscellaneous   The patient is noted to have elevated platelet count   03/08/2014 Imaging   Positive for acute PE with CT evidence of right heart strain (RV/LV Ratio = 0.9) consistent with at least submassive (intermediate risk)PE.    03/09/2014 Imaging   US venous Doppler showed deep vein thrombosis noted in the right distal common femoral vein, femoral vein, and popliteal vein. DVT noted in the left femoral and popliteal veins   03/09/2014 Imaging   Patchy areas of acute left MCA territory infarction. 2. Several punctate foci of acute right MCA territory infarction. Possible trace subarachnoid hemorrhage in the high right frontal lobe. 3. Occluded left ICA and left MCA   03/20/2015 Imaging   Compared to MRI on 03/09/14, there has been expected evolutional change of left MCA infarction. In addition, there may be a few areas of acute-subacute infarcts in the left basal ganglia vs artifact.     07/31/2015 Pathology Results   Peripheral blood is positive for JAK2 mutation   07/31/2015 -  Chemotherapy   She is started on 500 mg daily Hydrea   08/15/2015 Miscellaneous   The dose of hydroxyurea is increased to 1000 mg daily   09/26/2015 Miscellaneous   Dose of Hydrea to 500 mg daily except on Saturdays and Sundays she takes 1000 mg.   10/25/2015 Adverse Reaction   Dose of Hydrea is reduced to  500 mg daily    10/08/2016 Miscellaneous   From 2017 to 2018, the dose of Hydrea is further adjusted.     REVIEW OF SYSTEMS:   Constitutional: Denies fevers, chills or abnormal weight loss Eyes: Denies blurriness of vision Ears, nose, mouth, throat, and face: Denies mucositis or sore  throat Respiratory: Denies cough, dyspnea or wheezes Cardiovascular: Denies palpitation, chest discomfort or lower extremity swelling Gastrointestinal:  Denies nausea, heartburn or change in bowel habits Lymphatics: Denies new lymphadenopathy or easy bruising Neurological:Denies numbness, tingling or new weaknesses Behavioral/Psych: Mood is stable, no new changes  All other systems were reviewed with the patient and are negative.  I have reviewed the past medical history, past surgical history, social history and family history with the patient and they are unchanged from previous note.  ALLERGIES:  is allergic to valtrex [valacyclovir hcl].  MEDICATIONS:  Current Outpatient Medications  Medication Sig Dispense Refill  . acetaminophen (TYLENOL) 160 MG/5ML liquid Take 480 mg by mouth every 4 (four) hours as needed for fever or pain.    . calamine lotion Apply 1 application as needed topically for itching. 120 mL 0  . chlorhexidine (PERIDEX) 0.12 % solution RINSE TWICE A DAY FOR 1 MINUTE  5  . ciclopirox (PENLAC) 8 % solution APPLY TOPICALLY AT BEDTIME OVER NAIL & SURROUNDING SKIN,APPLY DAILY OVER PREVIOUS COAT,AFTER 7 DAYS REMOVE WITH ALCOHOL AND CONTINUE CYCLE 6.6 mL 5  . diclofenac sodium (VOLTAREN) 1 % GEL APPLY 2 G TOPICALLY 3 (THREE) TIMES DAILY AS NEEDED. 300 g 0  . hydroxyurea (HYDREA) 500 MG capsule TAKE 1 TABLET (500 MG) DAILY EXCEPT 2 TABLETS ( 1000 MG ) ON WEDNESDAY, SATURDAY AND SUNDAY 360 capsule 1  . ipratropium-albuterol (DUONEB) 0.5-2.5 (3) MG/3ML SOLN Take 3 mLs by nebulization every 6 (six) hours as needed (shortness of breath). 360 mL 3  . lidocaine (LIDODERM) 5 % Place 1 patch daily onto the skin. Remove & Discard patch within 12 hours or as directed by MD (Patient taking differently: Place 1 patch onto the skin daily as needed (pain). Remove & Discard patch within 12 hours or as directed by MD) 15 patch 0  . methimazole (TAPAZOLE) 5 MG tablet TAKE 1 TABLET 2 TIMES A WEEK 8  tablet 4  . mupirocin cream (BACTROBAN) 2 % Apply 1 application topically 2 (two) times daily. 30 g 5  . NONFORMULARY OR COMPOUNDED ITEM Topical cream with equal parts diclofenac, gabapentin, lidocaine, menthol  Disp 100 gm 1 each 5  . Oral Hygiene Products (Q-CARE COVERD [place]ER/SUCTION) MISC Use as directed 10 each 5  . OXYGEN 2lpm 24/7  DME- [facility]    . pantoprazole sodium (PROTONIX) 40 mg/20 mL PACK Take 20 mLs (40 mg total) by mouth daily. 40 mL 3  . polyethylene glycol (MIRALAX / GLYCOLAX) packet TAKE 17G BY MOUTH TWICE A DAY (Patient taking differently: Take 17 g by mouth 2 (two) times daily as needed for mild constipation. ) 14 packet 1  . pregabalin (LYRICA) 50 MG capsule TAKE 1 CAPSULE (50 MG TOTAL) BY MOUTH 3 (THREE) TIMES DAILY. 90 capsule 5  . Probiotic Product (ALIGN) 4 MG CAPS Take 4 mg by mouth daily.     . propranolol (INDERAL) 10 MG tablet TAKE 0.5 TABLETS (5 MG TOTAL) BY MOUTH 2 (TWO) TIMES DAILY. 60 tablet 2  . terconazole (TERAZOL 7) 0.4 % vaginal cream Place 1 applicator vaginally at bedtime. (Patient taking differently: Place 1 applicator vaginally at bedtime as needed (irritation). )  90 g 3  . traMADol (ULTRAM) 50 MG tablet TAKE 2 TABLETS BY MOUTH 3 TIMES A DAY AS NEEDED 180 tablet 1  . traZODone (DESYREL) 50 MG tablet TAKE 1 TABLET (50 MG TOTAL) BY MOUTH AT BEDTIME AS NEEDED. FOR SLEEP 90 tablet 1  . warfarin (COUMADIN) 4 MG tablet TAKE 1 TABLET DAILY EXCEPT 1/2 ON SUNDAY AND THURSDAYS OR TAKE AS DIRECTED BY ANTICOAGULATION CLINIC. 90 DAY 90 tablet 1   No current facility-administered medications for this visit.     PHYSICAL EXAMINATION: ECOG PERFORMANCE STATUS: 3 - Symptomatic, >50% confined to bed  Vitals:   10/25/18 1509  BP: 109/64  Pulse: 85  Resp: 18  Temp: 99.1 F (37.3 C)  SpO2: 96%   Filed Weights    GENERAL:alert, no distress and comfortable, sitting on a wheel chair NEURO: alert with persistent neurological deficits  LABORATORY DATA:  I have  reviewed the data as listed    Component Value Date/Time   NA 140 09/19/2018 2002   NA 142 10/06/2016 1352   K 3.8 09/19/2018 2002   K 4.1 10/06/2016 1352   CL 105 09/19/2018 2002   CO2 26 09/19/2018 2002   CO2 27 10/06/2016 1352   GLUCOSE 109 (H) 09/19/2018 2002   GLUCOSE 90 10/06/2016 1352   BUN 20 09/19/2018 2002   BUN 12.9 10/06/2016 1352   CREATININE 0.50 09/26/2018 1811   CREATININE 0.6 10/06/2016 1352   CALCIUM 9.0 09/19/2018 2002   CALCIUM 9.4 10/06/2016 1352   PROT 7.5 09/19/2018 2002   ALBUMIN 3.3 (L) 09/19/2018 2002   AST 15 09/19/2018 2002   ALT 15 09/19/2018 2002   ALKPHOS 83 09/19/2018 2002   BILITOT 0.5 09/19/2018 2002   GFRNONAA >60 09/19/2018 2002   GFRAA >60 09/19/2018 2002    No results found for: SPEP, UPEP  Lab Results  Component Value Date   WBC 12.5 (H) 10/25/2018   NEUTROABS 10.2 (H) 10/25/2018   HGB 11.9 (L) 10/25/2018   HCT 38.7 10/25/2018   MCV 105.7 (H) 10/25/2018   PLT 641 (H) 10/25/2018      Chemistry      Component Value Date/Time   NA 140 09/19/2018 2002   NA 142 10/06/2016 1352   K 3.8 09/19/2018 2002   K 4.1 10/06/2016 1352   CL 105 09/19/2018 2002   CO2 26 09/19/2018 2002   CO2 27 10/06/2016 1352   BUN 20 09/19/2018 2002   BUN 12.9 10/06/2016 1352   CREATININE 0.50 09/26/2018 1811   CREATININE 0.6 10/06/2016 1352      Component Value Date/Time   CALCIUM 9.0 09/19/2018 2002   CALCIUM 9.4 10/06/2016 1352   ALKPHOS 83 09/19/2018 2002   AST 15 09/19/2018 2002   ALT 15 09/19/2018 2002   BILITOT 0.5 09/19/2018 2002       RADIOGRAPHIC STUDIES: I have personally reviewed the radiological images as listed and agreed with the findings in the report. Ct Chest W Contrast  Result Date: 09/27/2018 CLINICAL DATA:  Multinodular quarter surveillance. History of embolization July 2020. EXAM: CT CHEST WITH CONTRAST TECHNIQUE: Multidetector CT imaging of the chest was performed during intravenous contrast administration. CONTRAST:   19mL OMNIPAQUE IOHEXOL 300 MG/ML  SOLN COMPARISON:  CT 04/21/2016 FINDINGS: Thyroid: Redemonstration of a markedly enlarged, heterogeneous thyroid gland with multiple nodules and scattered internal calcifications. Significant substernal extent with marked rightward deviation of the trachea and esophagus as well as mass effect on the aortic arch and great vessels. Approximate  measurements are 4.7 cm AP x 10.9 cm TR x 9.0 cm CC (series 2, image 38; series 7, image 79). This is slightly increased in size when compared with previous study, although the full extent of the thyroid was not entirely included on the previous study (maximal transaxial dimensions on 04/21/2016 measure approximately 4.5 x 10.0 cm). Cardiovascular: Mild cardiomegaly. No pericardial effusion. Mild dilation of the main pulmonary artery suggesting pulmonary arterial hypertension. Aortic arch is displaced the left secondary to large substernal extent multinodular thyroid gland. Atherosclerotic calcification of the aorta and coronary arteries. Mediastinum/Nodes: No axillary, mediastinal, or hilar lymphadenopathy. Trachea and esophagus are markedly deviated to the right secondary to large substernal thyroid. Lungs/Pleura: Trace left pleural effusion with associated left basilar atelectasis. Pulmonary venous congestion, left worse than right. No focal airspace consolidation. No pneumothorax. Upper Abdomen: No acute findings within the visualized upper abdomen. Musculoskeletal: Chronic superior endplate compression deformity of L1. Severe degenerative changes of the bilateral shoulders. No acute osseous findings. IMPRESSION: 1. Large multinodular goiter appears slightly increased in size when compared with prior study 04/21/2016. Persistent mass effect on the trachea, esophagus, and vascular structures of the superior mediastinum. 2. Cardiomegaly with pulmonary vascular congestion and small left pleural effusion suggesting congestive heart failure. 3.  Findings of pulmonary arterial hypertension. Electronically Signed   By: Davina Poke M.D.   On: 09/27/2018 09:45    All questions were answered. The patient knows to call the clinic with any problems, questions or concerns. No barriers to learning was detected.  I spent 15 minutes counseling the patient face to face. The total time spent in the appointment was 20 minutes and more than 50% was on counseling and review of test results  Heath Lark, MD 10/25/2018 3:30 PM

## 2018-10-25 NOTE — Telephone Encounter (Signed)
That was done yesterday. She did not mention the need for this lab.

## 2018-10-25 NOTE — Telephone Encounter (Signed)
-----   Message from Octavio Manns sent at 10/25/2018 10:51 AM EDT ----- Pt is needing a BMET for her Ct scan - She has an appt today at 2:15 with her oncologist. Can you see if they can add this order to the labs she is getting today?  Thanks

## 2018-10-25 NOTE — Assessment & Plan Note (Signed)
Due to her high risk disease, she will continue anticoagulation therapy indefinitely She will continue INR monitoring through her primary care doctor. Yesterday, her INR is stable and she has no bleeding complications 

## 2018-10-25 NOTE — Telephone Encounter (Signed)
LVM with Dr. Calton Dach nurse to add on with labs drawn today. Left my direct number.

## 2018-10-25 NOTE — Assessment & Plan Note (Signed)
According to her daughter, she has 3 decubitus ulcers that are healing slowly She goes to wound care clinic on a regular basis I suspect this is causing mild reactive leukocytosis and thrombocytosis; this is not conclusive for infection, however I will defer to them for further management

## 2018-10-25 NOTE — Telephone Encounter (Signed)
Does she needs INR too?

## 2018-10-26 ENCOUNTER — Telehealth: Payer: Self-pay | Admitting: Hematology and Oncology

## 2018-10-26 ENCOUNTER — Telehealth: Payer: Self-pay | Admitting: Internal Medicine

## 2018-10-26 DIAGNOSIS — L89314 Pressure ulcer of right buttock, stage 4: Secondary | ICD-10-CM | POA: Diagnosis not present

## 2018-10-26 DIAGNOSIS — G629 Polyneuropathy, unspecified: Secondary | ICD-10-CM | POA: Diagnosis not present

## 2018-10-26 DIAGNOSIS — I69351 Hemiplegia and hemiparesis following cerebral infarction affecting right dominant side: Secondary | ICD-10-CM | POA: Diagnosis not present

## 2018-10-26 DIAGNOSIS — L89323 Pressure ulcer of left buttock, stage 3: Secondary | ICD-10-CM | POA: Diagnosis not present

## 2018-10-26 DIAGNOSIS — E0520 Thyrotoxicosis with toxic multinodular goiter without thyrotoxic crisis or storm: Secondary | ICD-10-CM | POA: Diagnosis not present

## 2018-10-26 DIAGNOSIS — Z4800 Encounter for change or removal of nonsurgical wound dressing: Secondary | ICD-10-CM | POA: Diagnosis not present

## 2018-10-26 LAB — TSH: TSH: 0.73 u[IU]/mL (ref 0.308–3.960)

## 2018-10-26 LAB — T3: T3, Total: 75 ng/dL (ref 71–180)

## 2018-10-26 NOTE — Telephone Encounter (Signed)
Left msg for pt's dtr to call me back at [phone]

## 2018-10-26 NOTE — Telephone Encounter (Signed)
Is this something you can help me with

## 2018-10-26 NOTE — Telephone Encounter (Signed)
Scheduled appt per 10/20 sch message - mailed reminder letter with appt date and time

## 2018-10-26 NOTE — Telephone Encounter (Signed)
Anfal Fiorito the daughter called to see if patient has to have CT scan at [facility] or can it be at another facility. She was told she could have an appt earlier .     Call back number [phone]   Please advise

## 2018-10-27 ENCOUNTER — Other Ambulatory Visit: Payer: Self-pay | Admitting: Internal Medicine

## 2018-10-27 ENCOUNTER — Other Ambulatory Visit: Payer: Self-pay

## 2018-10-27 ENCOUNTER — Ambulatory Visit
Admission: RE | Admit: 2018-10-27 | Discharge: 2018-10-27 | Disposition: A | Discharge status: Home / Self Care | Payer: Medicare Other | Source: Ambulatory Visit | Attending: Internal Medicine | Admitting: Internal Medicine

## 2018-10-27 DIAGNOSIS — R1907 Generalized intra-abdominal and pelvic swelling, mass and lump: Secondary | ICD-10-CM

## 2018-10-28 DIAGNOSIS — I69351 Hemiplegia and hemiparesis following cerebral infarction affecting right dominant side: Secondary | ICD-10-CM | POA: Diagnosis not present

## 2018-10-28 DIAGNOSIS — L89314 Pressure ulcer of right buttock, stage 4: Secondary | ICD-10-CM | POA: Diagnosis not present

## 2018-10-28 DIAGNOSIS — G629 Polyneuropathy, unspecified: Secondary | ICD-10-CM | POA: Diagnosis not present

## 2018-10-28 DIAGNOSIS — E0520 Thyrotoxicosis with toxic multinodular goiter without thyrotoxic crisis or storm: Secondary | ICD-10-CM | POA: Diagnosis not present

## 2018-10-28 DIAGNOSIS — Z4800 Encounter for change or removal of nonsurgical wound dressing: Secondary | ICD-10-CM | POA: Diagnosis not present

## 2018-10-28 DIAGNOSIS — L89323 Pressure ulcer of left buttock, stage 3: Secondary | ICD-10-CM | POA: Diagnosis not present

## 2018-10-31 ENCOUNTER — Other Ambulatory Visit: Payer: Self-pay

## 2018-10-31 ENCOUNTER — Ambulatory Visit (HOSPITAL_COMMUNITY)
Admission: RE | Admit: 2018-10-31 | Discharge: 2018-10-31 | Disposition: A | Discharge status: Home / Self Care | Payer: Medicare Other | Source: Ambulatory Visit | Attending: Internal Medicine | Admitting: Internal Medicine

## 2018-10-31 DIAGNOSIS — K8020 Calculus of gallbladder without cholecystitis without obstruction: Secondary | ICD-10-CM | POA: Diagnosis not present

## 2018-10-31 DIAGNOSIS — R1907 Generalized intra-abdominal and pelvic swelling, mass and lump: Secondary | ICD-10-CM | POA: Diagnosis not present

## 2018-10-31 DIAGNOSIS — I69351 Hemiplegia and hemiparesis following cerebral infarction affecting right dominant side: Secondary | ICD-10-CM | POA: Diagnosis not present

## 2018-10-31 DIAGNOSIS — Z4800 Encounter for change or removal of nonsurgical wound dressing: Secondary | ICD-10-CM | POA: Diagnosis not present

## 2018-10-31 DIAGNOSIS — K439 Ventral hernia without obstruction or gangrene: Secondary | ICD-10-CM | POA: Diagnosis not present

## 2018-10-31 DIAGNOSIS — L89323 Pressure ulcer of left buttock, stage 3: Secondary | ICD-10-CM | POA: Diagnosis not present

## 2018-10-31 DIAGNOSIS — G629 Polyneuropathy, unspecified: Secondary | ICD-10-CM | POA: Diagnosis not present

## 2018-10-31 DIAGNOSIS — E0520 Thyrotoxicosis with toxic multinodular goiter without thyrotoxic crisis or storm: Secondary | ICD-10-CM | POA: Diagnosis not present

## 2018-10-31 DIAGNOSIS — L89314 Pressure ulcer of right buttock, stage 4: Secondary | ICD-10-CM | POA: Diagnosis not present

## 2018-10-31 MED ORDER — SODIUM CHLORIDE (PF) 0.9 % IJ SOLN
INTRAMUSCULAR | Status: AC
  Filled 2018-10-31: qty 50

## 2018-10-31 MED ORDER — IOHEXOL 300 MG/ML  SOLN
100.0000 mL | Freq: Once | INTRAMUSCULAR | Status: AC | PRN
  Administered 2018-10-31: 100 mL via INTRAVENOUS

## 2018-11-01 ENCOUNTER — Encounter (HOSPITAL_BASED_OUTPATIENT_CLINIC_OR_DEPARTMENT_OTHER): Payer: Medicare Other | Admitting: Internal Medicine

## 2018-11-01 ENCOUNTER — Telehealth: Payer: Self-pay | Admitting: Internal Medicine

## 2018-11-01 DIAGNOSIS — L89154 Pressure ulcer of sacral region, stage 4: Secondary | ICD-10-CM | POA: Diagnosis not present

## 2018-11-01 DIAGNOSIS — L89323 Pressure ulcer of left buttock, stage 3: Secondary | ICD-10-CM | POA: Diagnosis not present

## 2018-11-01 DIAGNOSIS — R5381 Other malaise: Secondary | ICD-10-CM | POA: Diagnosis not present

## 2018-11-01 DIAGNOSIS — I69359 Hemiplegia and hemiparesis following cerebral infarction affecting unspecified side: Secondary | ICD-10-CM | POA: Diagnosis not present

## 2018-11-01 DIAGNOSIS — E042 Nontoxic multinodular goiter: Secondary | ICD-10-CM | POA: Diagnosis not present

## 2018-11-01 DIAGNOSIS — L89153 Pressure ulcer of sacral region, stage 3: Secondary | ICD-10-CM | POA: Diagnosis not present

## 2018-11-01 NOTE — Telephone Encounter (Signed)
Home Health Verbal Orders - Caller/Agency: [facility] Number: [phone] Requesting OT/PT/Skilled Nursing/Social Work/Speech Therapy: Sandy Barrett is discharging her and they're recommending her to pursue [name] (the reason why because the daughter wants CNA home health help, they can provide more days of CNA care per week) Frequency:   *Wounds are not healing, they are getting worse. Sandy Barrett believes that the barrier to pt's healing is that [facility] home health aid cannot place pt on her left side so that the right sided wounds can heal. [facility]'s goal is to heal the wounds but it will not happen, pt needs to go every week to wound care.   Requesting call back for [facility] notes/records. Please advise

## 2018-11-02 ENCOUNTER — Encounter: Payer: Self-pay | Admitting: Internal Medicine

## 2018-11-02 ENCOUNTER — Telehealth: Payer: Self-pay

## 2018-11-02 ENCOUNTER — Telehealth: Payer: Self-pay | Admitting: Internal Medicine

## 2018-11-02 DIAGNOSIS — G629 Polyneuropathy, unspecified: Secondary | ICD-10-CM | POA: Diagnosis not present

## 2018-11-02 DIAGNOSIS — Z4800 Encounter for change or removal of nonsurgical wound dressing: Secondary | ICD-10-CM | POA: Diagnosis not present

## 2018-11-02 DIAGNOSIS — L89323 Pressure ulcer of left buttock, stage 3: Secondary | ICD-10-CM | POA: Diagnosis not present

## 2018-11-02 DIAGNOSIS — L89314 Pressure ulcer of right buttock, stage 4: Secondary | ICD-10-CM | POA: Diagnosis not present

## 2018-11-02 DIAGNOSIS — E0520 Thyrotoxicosis with toxic multinodular goiter without thyrotoxic crisis or storm: Secondary | ICD-10-CM | POA: Diagnosis not present

## 2018-11-02 DIAGNOSIS — L8990 Pressure ulcer of unspecified site, unspecified stage: Secondary | ICD-10-CM

## 2018-11-02 DIAGNOSIS — I69351 Hemiplegia and hemiparesis following cerebral infarction affecting right dominant side: Secondary | ICD-10-CM | POA: Diagnosis not present

## 2018-11-02 NOTE — Telephone Encounter (Signed)
Copied from [place] [id number]. Topic: General - Inquiry >> Nov 02, 2018  9:13 AM Mathis Bud wrote: Reason for CRM: Dolores Lory from [facility] home [facility]  called checking status on a fax she sent over 9/25.  The order is a physician order  Order # [place] call back [phone]

## 2018-11-02 NOTE — Telephone Encounter (Signed)
Tried calling Dyann Ruddle back in regards. No answer and VM was full.

## 2018-11-02 NOTE — Telephone Encounter (Signed)
Spoke with Dyann Ruddle. Documented in another office note.

## 2018-11-02 NOTE — Telephone Encounter (Signed)
Tried calling back. No answer and VM was full.

## 2018-11-02 NOTE — Telephone Encounter (Signed)
Sent fax requesting these forms to be resent. Did not get this particular one.

## 2018-11-02 NOTE — Telephone Encounter (Signed)
Pts daughter is wanting to switch home care services to [facility]. Wants to have more CNA time and help with wound care. Needs referral.

## 2018-11-02 NOTE — Telephone Encounter (Signed)
Dyann Ruddle called to report that the care giver refused wound care today from skilled nursing visit/ care giver reported that Pt had wound care yesterday at the wound care clinic / Dyann Ruddle stated the Pts wounds are not healing / she would like a call back asap to update her information    Nanine Means is recommending Pt be placed with [facility] and to discharged from [facility] due to Pt needing more CMA care in home. / Nanine Means is not strong on CMA care.

## 2018-11-04 ENCOUNTER — Telehealth: Payer: Self-pay | Admitting: Internal Medicine

## 2018-11-04 DIAGNOSIS — G629 Polyneuropathy, unspecified: Secondary | ICD-10-CM | POA: Diagnosis not present

## 2018-11-04 DIAGNOSIS — L89323 Pressure ulcer of left buttock, stage 3: Secondary | ICD-10-CM | POA: Diagnosis not present

## 2018-11-04 DIAGNOSIS — L89314 Pressure ulcer of right buttock, stage 4: Secondary | ICD-10-CM | POA: Diagnosis not present

## 2018-11-04 DIAGNOSIS — I69351 Hemiplegia and hemiparesis following cerebral infarction affecting right dominant side: Secondary | ICD-10-CM | POA: Diagnosis not present

## 2018-11-04 DIAGNOSIS — E0520 Thyrotoxicosis with toxic multinodular goiter without thyrotoxic crisis or storm: Secondary | ICD-10-CM | POA: Diagnosis not present

## 2018-11-04 DIAGNOSIS — Z4800 Encounter for change or removal of nonsurgical wound dressing: Secondary | ICD-10-CM | POA: Diagnosis not present

## 2018-11-04 NOTE — Telephone Encounter (Signed)
Sent another msg to [place] stressing her discharge date with [facility]

## 2018-11-04 NOTE — Telephone Encounter (Signed)
Ms.Wood called back stating she would like to speak with CMA as pt's daughter would not allow her to answer phone at time of call. Dyann Ruddle is needing to speak with CMA in regards to continuing RN visits 2 more times, one on Monday and DC date is Wednesday, as per medicare. Please advise and call back is [phone]

## 2018-11-04 NOTE — Telephone Encounter (Signed)
I know you have been going back and forth with Sandy Barrett. Sandy Barrett wanted to stress that she will be completely done with Sandy Barrett on Wednesday and will need wound care next Friday. Not sure what all Sandy Barrett will do but just wanted to let you know.

## 2018-11-04 NOTE — Telephone Encounter (Signed)
Called [place] back. No answer and no VM set up. The referral was just put on on 10/28. Our referral coordinators have been back and forth with [facility] in regards to Ms. Ostrovsky. Will contact them when we figure out the plan.

## 2018-11-04 NOTE — Telephone Encounter (Signed)
Tally Due, from [facility], states they are discharging pt from [facility] today. Pt is needing to be referred to another [facility] agency because she is needing more CNA time. Dyann Ruddle is requesting to have a call back.  Please advise.    [phone]

## 2018-11-06 DIAGNOSIS — I69320 Aphasia following cerebral infarction: Secondary | ICD-10-CM | POA: Diagnosis not present

## 2018-11-06 DIAGNOSIS — D473 Essential (hemorrhagic) thrombocythemia: Secondary | ICD-10-CM | POA: Diagnosis not present

## 2018-11-06 DIAGNOSIS — I69390 Apraxia following cerebral infarction: Secondary | ICD-10-CM | POA: Diagnosis not present

## 2018-11-06 DIAGNOSIS — E0520 Thyrotoxicosis with toxic multinodular goiter without thyrotoxic crisis or storm: Secondary | ICD-10-CM | POA: Diagnosis not present

## 2018-11-06 DIAGNOSIS — Z7901 Long term (current) use of anticoagulants: Secondary | ICD-10-CM | POA: Diagnosis not present

## 2018-11-06 DIAGNOSIS — M150 Primary generalized (osteo)arthritis: Secondary | ICD-10-CM | POA: Diagnosis not present

## 2018-11-06 DIAGNOSIS — G629 Polyneuropathy, unspecified: Secondary | ICD-10-CM | POA: Diagnosis not present

## 2018-11-06 DIAGNOSIS — R1310 Dysphagia, unspecified: Secondary | ICD-10-CM | POA: Diagnosis not present

## 2018-11-06 DIAGNOSIS — Z4800 Encounter for change or removal of nonsurgical wound dressing: Secondary | ICD-10-CM | POA: Diagnosis not present

## 2018-11-06 DIAGNOSIS — Z9981 Dependence on supplemental oxygen: Secondary | ICD-10-CM | POA: Diagnosis not present

## 2018-11-06 DIAGNOSIS — Z87440 Personal history of urinary (tract) infections: Secondary | ICD-10-CM | POA: Diagnosis not present

## 2018-11-06 DIAGNOSIS — L89323 Pressure ulcer of left buttock, stage 3: Secondary | ICD-10-CM | POA: Diagnosis not present

## 2018-11-06 DIAGNOSIS — G43909 Migraine, unspecified, not intractable, without status migrainosus: Secondary | ICD-10-CM | POA: Diagnosis not present

## 2018-11-06 DIAGNOSIS — I69391 Dysphagia following cerebral infarction: Secondary | ICD-10-CM | POA: Diagnosis not present

## 2018-11-06 DIAGNOSIS — Z7401 Bed confinement status: Secondary | ICD-10-CM | POA: Diagnosis not present

## 2018-11-06 DIAGNOSIS — Z86718 Personal history of other venous thrombosis and embolism: Secondary | ICD-10-CM | POA: Diagnosis not present

## 2018-11-06 DIAGNOSIS — J9611 Chronic respiratory failure with hypoxia: Secondary | ICD-10-CM | POA: Diagnosis not present

## 2018-11-06 DIAGNOSIS — I69351 Hemiplegia and hemiparesis following cerebral infarction affecting right dominant side: Secondary | ICD-10-CM | POA: Diagnosis not present

## 2018-11-06 DIAGNOSIS — Z86711 Personal history of pulmonary embolism: Secondary | ICD-10-CM | POA: Diagnosis not present

## 2018-11-06 DIAGNOSIS — L89314 Pressure ulcer of right buttock, stage 4: Secondary | ICD-10-CM | POA: Diagnosis not present

## 2018-11-06 DIAGNOSIS — H903 Sensorineural hearing loss, bilateral: Secondary | ICD-10-CM | POA: Diagnosis not present

## 2018-11-07 DIAGNOSIS — E0520 Thyrotoxicosis with toxic multinodular goiter without thyrotoxic crisis or storm: Secondary | ICD-10-CM | POA: Diagnosis not present

## 2018-11-07 DIAGNOSIS — G629 Polyneuropathy, unspecified: Secondary | ICD-10-CM | POA: Diagnosis not present

## 2018-11-07 DIAGNOSIS — L89323 Pressure ulcer of left buttock, stage 3: Secondary | ICD-10-CM | POA: Diagnosis not present

## 2018-11-07 DIAGNOSIS — L89314 Pressure ulcer of right buttock, stage 4: Secondary | ICD-10-CM | POA: Diagnosis not present

## 2018-11-07 DIAGNOSIS — Z4800 Encounter for change or removal of nonsurgical wound dressing: Secondary | ICD-10-CM | POA: Diagnosis not present

## 2018-11-07 DIAGNOSIS — I69351 Hemiplegia and hemiparesis following cerebral infarction affecting right dominant side: Secondary | ICD-10-CM | POA: Diagnosis not present

## 2018-11-08 DIAGNOSIS — E0520 Thyrotoxicosis with toxic multinodular goiter without thyrotoxic crisis or storm: Secondary | ICD-10-CM | POA: Diagnosis not present

## 2018-11-08 DIAGNOSIS — I69351 Hemiplegia and hemiparesis following cerebral infarction affecting right dominant side: Secondary | ICD-10-CM | POA: Diagnosis not present

## 2018-11-08 DIAGNOSIS — L89314 Pressure ulcer of right buttock, stage 4: Secondary | ICD-10-CM | POA: Diagnosis not present

## 2018-11-08 DIAGNOSIS — Z4800 Encounter for change or removal of nonsurgical wound dressing: Secondary | ICD-10-CM | POA: Diagnosis not present

## 2018-11-08 DIAGNOSIS — L89323 Pressure ulcer of left buttock, stage 3: Secondary | ICD-10-CM | POA: Diagnosis not present

## 2018-11-08 DIAGNOSIS — G629 Polyneuropathy, unspecified: Secondary | ICD-10-CM | POA: Diagnosis not present

## 2018-11-10 DIAGNOSIS — Z4800 Encounter for change or removal of nonsurgical wound dressing: Secondary | ICD-10-CM | POA: Diagnosis not present

## 2018-11-10 DIAGNOSIS — L89323 Pressure ulcer of left buttock, stage 3: Secondary | ICD-10-CM | POA: Diagnosis not present

## 2018-11-10 DIAGNOSIS — E0520 Thyrotoxicosis with toxic multinodular goiter without thyrotoxic crisis or storm: Secondary | ICD-10-CM | POA: Diagnosis not present

## 2018-11-10 DIAGNOSIS — G629 Polyneuropathy, unspecified: Secondary | ICD-10-CM | POA: Diagnosis not present

## 2018-11-10 DIAGNOSIS — I69351 Hemiplegia and hemiparesis following cerebral infarction affecting right dominant side: Secondary | ICD-10-CM | POA: Diagnosis not present

## 2018-11-10 DIAGNOSIS — L89314 Pressure ulcer of right buttock, stage 4: Secondary | ICD-10-CM | POA: Diagnosis not present

## 2018-11-16 ENCOUNTER — Telehealth: Payer: Self-pay | Admitting: Internal Medicine

## 2018-11-16 NOTE — Telephone Encounter (Signed)
Copied from [place] [id number]. Topic: General - Other >> Nov 16, 2018  4:02 PM Keene Breath wrote: Reason for CRM: Called for verbal orders to continue PT 2wk 1, 1wk 1, 2wk 3.  Please advise and call with any questions at [phone]

## 2018-11-16 NOTE — Telephone Encounter (Signed)
LVM giving ok for orders.  

## 2018-11-18 ENCOUNTER — Ambulatory Visit: Payer: Medicare Other | Admitting: Podiatry

## 2018-11-21 ENCOUNTER — Other Ambulatory Visit: Payer: Medicare Other

## 2018-11-22 DIAGNOSIS — I69359 Hemiplegia and hemiparesis following cerebral infarction affecting unspecified side: Secondary | ICD-10-CM | POA: Diagnosis not present

## 2018-11-22 DIAGNOSIS — R5381 Other malaise: Secondary | ICD-10-CM | POA: Diagnosis not present

## 2018-11-22 DIAGNOSIS — E042 Nontoxic multinodular goiter: Secondary | ICD-10-CM | POA: Diagnosis not present

## 2018-11-22 DIAGNOSIS — L89323 Pressure ulcer of left buttock, stage 3: Secondary | ICD-10-CM | POA: Diagnosis not present

## 2018-11-22 DIAGNOSIS — L89154 Pressure ulcer of sacral region, stage 4: Secondary | ICD-10-CM | POA: Diagnosis not present

## 2018-11-23 ENCOUNTER — Ambulatory Visit (INDEPENDENT_AMBULATORY_CARE_PROVIDER_SITE_OTHER): Payer: Medicare Other | Admitting: General Practice

## 2018-11-23 ENCOUNTER — Encounter: Payer: Self-pay | Admitting: Critical Care Medicine

## 2018-11-23 ENCOUNTER — Other Ambulatory Visit: Payer: Self-pay

## 2018-11-23 ENCOUNTER — Ambulatory Visit (INDEPENDENT_AMBULATORY_CARE_PROVIDER_SITE_OTHER): Payer: Medicare Other | Admitting: Critical Care Medicine

## 2018-11-23 VITALS — BP 120/70 | HR 92 | Ht 60.0 in | Wt 176.0 lb

## 2018-11-23 DIAGNOSIS — J9611 Chronic respiratory failure with hypoxia: Secondary | ICD-10-CM

## 2018-11-23 DIAGNOSIS — T17908D Unspecified foreign body in respiratory tract, part unspecified causing other injury, subsequent encounter: Secondary | ICD-10-CM

## 2018-11-23 DIAGNOSIS — E049 Nontoxic goiter, unspecified: Secondary | ICD-10-CM | POA: Diagnosis not present

## 2018-11-23 DIAGNOSIS — Z7901 Long term (current) use of anticoagulants: Secondary | ICD-10-CM

## 2018-11-23 DIAGNOSIS — R1311 Dysphagia, oral phase: Secondary | ICD-10-CM | POA: Diagnosis not present

## 2018-11-23 DIAGNOSIS — R5381 Other malaise: Secondary | ICD-10-CM | POA: Diagnosis not present

## 2018-11-23 DIAGNOSIS — Z86711 Personal history of pulmonary embolism: Secondary | ICD-10-CM

## 2018-11-23 LAB — POCT INR: INR: 1.3 — AB (ref 2.0–3.0)

## 2018-11-23 NOTE — Progress Notes (Signed)
Synopsis: Referred in March 2019 for post-hospitalization for pneumonia by Sandy Rail, MD.  Previously patient of Dr. Melvyn Barrett.  Subjective:   PATIENT ID: Sandy Barrett GENDER: female DOB: 04-18-1932, MRN: [id number]  Chief Complaint  Patient presents with  . Consult    Pt switching care from Dr. Melvyn Barrett to Dr. Carlis Barrett.  Pt's daughter states pt has been doing okay. Pt has had some episodes with difficulty breathing and when this happens, pt's O2 is placed on. Pt wears 2L O2.    Sandy Barrett is an 85 year old woman with a history of CVA, PE on chronic warfarin, and chronic debility with multiple previous episodes of pneumonia who presents today to establish care.  She was previously seen by Dr. Melvyn Barrett.  Her daughter who is her full-time caregiver accompanies her, and is providing all the history as the patient is aphasic from her previous stroke.  She was hospitalized in 2018 in 2019 for pneumonia.  Her daughter is concerned about her mother's occasional need for oxygen.  When she arrived in the office today she was satting 83% on room air but without any noticeable signs of distress.  At times she is breathing faster at home and her daughter will place her on supplemental oxygen-2 L.  She sleeps with oxygen at night.  She has a portable concentrator, but does not use oxygen on a regular basis.  Her mother has chronic dysphagia, but does not always follow a dysphagia diet.  At times she will escalate the diet, but if she senses that her mother is likely aspirating, she will de-escalate to a dysphagia diet.  She is unsure how strong her mother's cough is.  In addition to being aphasic, her mother is wheelchair-bound.  Her mother remains on Coumadin, which is managed by the low our Coumadin clinic.  She has not had evidence of bleeding or complications related to Coumadin.    She has a history of a large substernal goiter, which has been treated at [facility] with embolization.        Past Medical  History:  Diagnosis Date  . ARTHRITIS   . Arthritis   . Diverticulosis   . Essential thrombocytosis ([place])   . HYPERTENSION   . HYPERTHYROIDISM   . Hyperthyroidism    s/p I-131 ablation 03/2011 of multinod goiter  . INSOMNIA   . MIGRAINE HEADACHE   . OBESITY   . Posttraumatic stress disorder   . Pulmonary embolism ([place]) 03/2014   with DVT  . Stroke [facility]) 03/2014   dysarthria     Family History  Problem Relation Age of Onset  . Asthma Mother   . Asthma Father   . Prostate cancer Father   . Stroke Brother   . Breast cancer Sister   . Stomach cancer Sister   . Thyroid disease Neg Hx      Past Surgical History:  Procedure Laterality Date  . ABDOMINAL HYSTERECTOMY  1976  . BREAST SURGERY     biopsy  . RADIOLOGY WITH ANESTHESIA Left 03/08/2014   Procedure: RADIOLOGY WITH ANESTHESIA;  Surgeon: Rob Hickman, MD;  Location: [facility];  Service: Radiology;  Laterality: Left;  . TONSILLECTOMY    . TOTAL HIP ARTHROPLASTY  1998    right    Social History   Socioeconomic History  . Marital status: Widowed    Spouse name: Not on file  . Number of children: 2  . Years of education: 35  . Highest education level: Not on file  Occupational History  . Occupation: retired    Comment: Restaurant manager, fast food  Social Needs  . Financial resource strain: Not on file  . Food insecurity    Worry: Not on file    Inability: Not on file  . Transportation needs    Medical: Not on file    Non-medical: Not on file  Tobacco Use  . Smoking status: Never Smoker  . Smokeless tobacco: Never Used  Substance and Sexual Activity  . Alcohol use: No    Alcohol/week: 0.0 standard drinks  . Drug use: No  . Sexual activity: Never  Lifestyle  . Physical activity    Days per week: Not on file    Minutes per session: Not on file  . Stress: Not on file  Relationships  . Social Herbalist on phone: Not on file    Gets together: Not on file    Attends religious service: Not on file     Active member of club or organization: Not on file    Attends meetings of clubs or organizations: Not on file    Relationship status: Not on file  . Intimate partner violence    Fear of current or ex partner: Not on file    Emotionally abused: Not on file    Physically abused: Not on file    Forced sexual activity: Not on file  Other Topics Concern  . Not on file  Social History Narrative   Widowed, lived alone prior to CVA 03/2014   SNF at [facility], then home 07/2014 with dtr   Right handed   Caffeine use- occasionally drinks tea     Allergies  Allergen Reactions  . Valtrex [Valacyclovir Hcl] Cough    Cough and gagging     Immunization History  Administered Date(s) Administered  . Fluad Quad(high Dose 65+) 10/18/2018  . Influenza Split 10/28/2010  . Influenza, High Dose Seasonal PF 11/26/2014, 09/09/2016  . Influenza, Seasonal, Injecte, Preservative Fre 12/24/2011  . Influenza,inj,Quad PF,6+ Mos 08/31/2012, 10/25/2015  . Pneumococcal Conjugate-13 11/26/2014  . Pneumococcal Polysaccharide-23 07/22/2010  . Tdap 07/22/2010    Outpatient Medications Prior to Visit  Medication Sig Dispense Refill  . acetaminophen (TYLENOL) 160 MG/5ML liquid Take 480 mg by mouth every 4 (four) hours as needed for fever or pain.    . ciclopirox (PENLAC) 8 % solution APPLY TOPICALLY AT BEDTIME OVER NAIL & SURROUNDING SKIN,APPLY DAILY OVER PREVIOUS COAT,AFTER 7 DAYS REMOVE WITH ALCOHOL AND CONTINUE CYCLE 6.6 mL 5  . diclofenac sodium (VOLTAREN) 1 % GEL APPLY 2 G TOPICALLY 3 (THREE) TIMES DAILY AS NEEDED. 300 g 0  . hydroxyurea (HYDREA) 500 MG capsule TAKE 1 TABLET (500 MG) DAILY EXCEPT 2 TABLETS ( 1000 MG ) ON WEDNESDAY, SATURDAY AND SUNDAY 360 capsule 1  . ipratropium-albuterol (DUONEB) 0.5-2.5 (3) MG/3ML SOLN Take 3 mLs by nebulization every 6 (six) hours as needed (shortness of breath). 360 mL 3  . lidocaine (LIDODERM) 5 % Place 1 patch daily onto the skin. Remove & Discard patch within  12 hours or as directed by MD (Patient taking differently: Place 1 patch onto the skin daily as needed (pain). Remove & Discard patch within 12 hours or as directed by MD) 15 patch 0  . methimazole (TAPAZOLE) 5 MG tablet TAKE 1 TABLET 2 TIMES A WEEK 8 tablet 4  . NONFORMULARY OR COMPOUNDED ITEM Topical cream with equal parts diclofenac, gabapentin, lidocaine, menthol  Disp 100 gm 1 each 5  .  Oral Hygiene Products (Q-CARE COVERD YANKAUER/SUCTION) MISC Use as directed 10 each 5  . OXYGEN 2lpm 24/7  DME- [facility]    . pantoprazole sodium (PROTONIX) 40 mg/20 mL PACK Take 20 mLs (40 mg total) by mouth daily. 40 mL 3  . polyethylene glycol (MIRALAX / GLYCOLAX) packet TAKE 17G BY MOUTH TWICE A DAY (Patient taking differently: Take 17 g by mouth 2 (two) times daily as needed for mild constipation. ) 14 packet 1  . pregabalin (LYRICA) 50 MG capsule TAKE 1 CAPSULE (50 MG TOTAL) BY MOUTH 3 (THREE) TIMES DAILY. 90 capsule 5  . Probiotic Product (ALIGN) 4 MG CAPS Take 4 mg by mouth daily.     . propranolol (INDERAL) 10 MG tablet TAKE 0.5 TABLETS (5 MG TOTAL) BY MOUTH 2 (TWO) TIMES DAILY. 60 tablet 2  . traMADol (ULTRAM) 50 MG tablet TAKE 2 TABLETS BY MOUTH 3 TIMES A DAY AS NEEDED 180 tablet 1  . traZODone (DESYREL) 50 MG tablet TAKE 1 TABLET (50 MG TOTAL) BY MOUTH AT BEDTIME AS NEEDED. FOR SLEEP 90 tablet 1  . warfarin (COUMADIN) 4 MG tablet TAKE 1 TABLET DAILY EXCEPT 1/2 ON SUNDAY AND THURSDAYS OR TAKE AS DIRECTED BY ANTICOAGULATION CLINIC. 90 DAY 90 tablet 1  . calamine lotion Apply 1 application as needed topically for itching. 120 mL 0  . chlorhexidine (PERIDEX) 0.12 % solution RINSE TWICE A DAY FOR 1 MINUTE  5  . mupirocin cream (BACTROBAN) 2 % Apply 1 application topically 2 (two) times daily. 30 g 5  . terconazole (TERAZOL 7) 0.4 % vaginal cream Place 1 applicator vaginally at bedtime. (Patient taking differently: Place 1 applicator vaginally at bedtime as needed (irritation). ) 90 g 3   No  facility-administered medications prior to visit.     Review of Systems  Reason unable to perform ROS: limited due to the patient being nonverbal.     Objective:   Vitals:   11/23/18 1528 11/23/18 1536  BP:  120/70  Pulse: 93 92  SpO2: (!) 83% 95%  Weight: 176 lb (79.8 kg)   Height: 5' (1.524 m)    95% on 2 LPM   She was saturating 83% when she arrived on RA.  BMI Readings from Last 3 Encounters:  11/23/18 34.37 kg/m  10/25/18 34.37 kg/m  10/18/18 34.37 kg/m   Wt Readings from Last 3 Encounters:  11/23/18 176 lb (79.8 kg)  08/30/18 176 lb (79.8 kg)  05/24/18 175 lb (79.4 kg)    Physical Exam Constitutional:      Appearance: She is obese.     Comments: Chronically ill-appearing, sitting in a wheelchair.  HENT:     Head: Normocephalic and atraumatic.     Mouth/Throat:     Mouth: Mucous membranes are moist.  Eyes:     General: No scleral icterus. Neck:     Musculoskeletal: Neck supple.     Comments: No supraclavicular goiter Cardiovascular:     Rate and Rhythm: Normal rate and regular rhythm.     Comments: Right sternal border murmur Pulmonary:     Comments: Breathing comfortably on 2 L supplemental oxygen.  Clear to auscultation bilaterally anteriorly.  No witnessed coughing and unable to cough on command. Abdominal:     Comments: Obese, soft, nondistended  Musculoskeletal:        General: Swelling present. No deformity.  Lymphadenopathy:     Cervical: No cervical adenopathy.  Skin:    General: Skin is warm and dry.  Findings: No rash.     Comments: Dilated skin veins mostly along the left neck and left side of the anterior chest wall  Neurological:     Mental Status: She is alert.     Motor: Weakness present.     Coordination: Coordination abnormal.     Comments: Awake and alert, looking around the room, attentive when she is being spoken to.  Comprehension does not appear intact.  She attempts to speak, but her speech is incomprehensible.  She is  able to move her left arm and is able to slightly move her upper body.  She responds to nonverbal communication, mostly from her daughter.      CBC    Component Value Date/Time   WBC 12.5 (H) 10/25/2018 1447   RBC 3.66 (L) 10/25/2018 1447   HGB 11.9 (L) 10/25/2018 1447   HGB 14.1 10/06/2016 1439   HCT 38.7 10/25/2018 1447   HCT 43.5 10/06/2016 1439   PLT 641 (H) 10/25/2018 1447   PLT 259 Clumped Platelets--Appears Adequate 10/06/2016 1439   MCV 105.7 (H) 10/25/2018 1447   MCV 114.2 (H) 10/06/2016 1439   MCH 32.5 10/25/2018 1447   MCHC 30.7 10/25/2018 1447   RDW 17.8 (H) 10/25/2018 1447   RDW 16.2 (H) 10/06/2016 1439   LYMPHSABS 1.2 10/25/2018 1447   LYMPHSABS 1.0 10/06/2016 1439   MONOABS 0.6 10/25/2018 1447   MONOABS 0.5 10/06/2016 1439   EOSABS 0.3 10/25/2018 1447   EOSABS 0.1 10/06/2016 1439   BASOSABS 0.1 10/25/2018 1447   BASOSABS 0.1 10/06/2016 1439     INR 1.3 on 11/18/thousand 20  Chest Imaging- films reviewed: CT chest with contrast 09/26/2018-enlarging multinodular goiter exerting mass-effect severe rightward deviation on the trachea and upper mediastinum.  Cardiomegaly with enlarged pulmonary veins and groundglass opacities diffusely suggestive of pulmonary edema.  Trivial left dependent effusion. Significant left chest collateral circulation anteriorly and laterally.  Pulmonary Functions Testing Results: No flowsheet data found.   02/13/2017-echocardiogram-LV ejection fraction 60 to 65%, PAP pressure 45  ONO rec RA 03/31/17 desat <89% x 10h 39 min rec 2lpm hs  ONO on 2lpm (done by [facility] on 04/23/17) was normal  Needs to continue using her 2lpm o2 with sleep    Echocardiogram: LVEF 60 to 123456, normal diastolic function.  Dilated RV.  Normal atria.  Mild MR.     Assessment & Plan:     ICD-10-CM   1. Chronic respiratory failure with hypoxia (HCC)  J96.11 Ambulatory Referral for DME  2. Oral phase dysphagia  R13.11   3. Substernal goiter  E04.9   4.  Chronic anticoagulation  Z79.01   5. History of pulmonary embolus (PE)  Z86.711   6. Aspiration into airway, subsequent encounter  T17.908D   7. Debility  R53.81     Chronic hypoxic respiratory failure-multifactorial in etiology.  I suspect she has a component of chronic aspiration of upper airway secretions pulmonary function from chronic hypoxic respiratory failure and her previous PE.  Based on her CT scan, she likely has HFpEF.  Competently, restrictive lung disease due to neuromuscular weakness, cardiomegaly, and progressive substernal goiter.  Unfortunately I do not think any of these causes are can have easy solutions. -Started on 2 L of supplemental oxygen today in the office.  Prescribed continue with some supplemental oxygen.  She is due for a recertification with her home health company.  Her daughter is concerned that she may not always need oxygen.  My suggestion is that she leave  it on any time that her oxygen cannot be closely monitored, especially her daughter has to be away from her.  Her daughter solutions that she can check hourly throughout the day at home, and apply supplemental oxygen if needed  Large substernal thyroid goiter causing significant airway and esophageal deviation to the right -Recommended follow-up with [facility] to determine if additional embolization procedures would be helpful -Mrs. Delone would unfortunately not be a good candidate for surgical intervention given the size and location of this goiter  History of PE on chronic anticoagulation, chronic immobility 2/2 debility -Recommend continue use of Coumadin given the nonmodifiable risk factor of immobility.  We discussed risks associated with anticoagulation, including spontaneous bleeding.  Her daughter understands that we should periodically weigh the risks and benefits of ongoing anticoagulation.  Dysphagia-likely due to altered sensation and swallow strength and coordination from previous stroke.   The degree of esophageal deviation through the mediastinum from the goiter likely worsens her symptoms -Recommend following a dysphagia diet closely -Recommend direct supervision with eating and drinking. -We discussed that aspiration does not have to involve food or drinks rather upper airway secretions are common cause of aspiration pneumonias.  Aggressive intervention such as tracheostomy and PEG's do not reduce this risk.  Her daughter understands that this will forever be an ongoing battle for her mother.  Up-to-date pneumonia and flu vaccines.  RTC in 3 months.  Greater than 1 hour spent on this encounter, including more than 50% face-to-face with the patient and her daughter.   Current Outpatient Medications:  .  acetaminophen (TYLENOL) 160 MG/5ML liquid, Take 480 mg by mouth every 4 (four) hours as needed for fever or pain., Disp: , Rfl:  .  ciclopirox (PENLAC) 8 % solution, APPLY TOPICALLY AT BEDTIME OVER NAIL & SURROUNDING SKIN,APPLY DAILY OVER PREVIOUS COAT,AFTER 7 DAYS REMOVE WITH ALCOHOL AND CONTINUE CYCLE, Disp: 6.6 mL, Rfl: 5 .  diclofenac sodium (VOLTAREN) 1 % GEL, APPLY 2 G TOPICALLY 3 (THREE) TIMES DAILY AS NEEDED., Disp: 300 g, Rfl: 0 .  hydroxyurea (HYDREA) 500 MG capsule, TAKE 1 TABLET (500 MG) DAILY EXCEPT 2 TABLETS ( 1000 MG ) ON WEDNESDAY, SATURDAY AND SUNDAY, Disp: 360 capsule, Rfl: 1 .  ipratropium-albuterol (DUONEB) 0.5-2.5 (3) MG/3ML SOLN, Take 3 mLs by nebulization every 6 (six) hours as needed (shortness of breath)., Disp: 360 mL, Rfl: 3 .  lidocaine (LIDODERM) 5 %, Place 1 patch daily onto the skin. Remove & Discard patch within 12 hours or as directed by MD (Patient taking differently: Place 1 patch onto the skin daily as needed (pain). Remove & Discard patch within 12 hours or as directed by MD), Disp: 15 patch, Rfl: 0 .  methimazole (TAPAZOLE) 5 MG tablet, TAKE 1 TABLET 2 TIMES A WEEK, Disp: 8 tablet, Rfl: 4 .  NONFORMULARY OR COMPOUNDED ITEM, Topical cream with  equal parts diclofenac, gabapentin, lidocaine, menthol  Disp 100 gm, Disp: 1 each, Rfl: 5 .  Oral Hygiene Products (Q-CARE COVERD YANKAUER/SUCTION) MISC, Use as directed, Disp: 10 each, Rfl: 5 .  OXYGEN, 2lpm 24/7  DME- [facility], Disp: , Rfl:  .  pantoprazole sodium (PROTONIX) 40 mg/20 mL PACK, Take 20 mLs (40 mg total) by mouth daily., Disp: 40 mL, Rfl: 3 .  polyethylene glycol (MIRALAX / GLYCOLAX) packet, TAKE 17G BY MOUTH TWICE A DAY (Patient taking differently: Take 17 g by mouth 2 (two) times daily as needed for mild constipation. ), Disp: 14 packet, Rfl: 1 .  pregabalin (LYRICA)  50 MG capsule, TAKE 1 CAPSULE (50 MG TOTAL) BY MOUTH 3 (THREE) TIMES DAILY., Disp: 90 capsule, Rfl: 5 .  Probiotic Product (ALIGN) 4 MG CAPS, Take 4 mg by mouth daily. , Disp: , Rfl:  .  propranolol (INDERAL) 10 MG tablet, TAKE 0.5 TABLETS (5 MG TOTAL) BY MOUTH 2 (TWO) TIMES DAILY., Disp: 60 tablet, Rfl: 2 .  traMADol (ULTRAM) 50 MG tablet, TAKE 2 TABLETS BY MOUTH 3 TIMES A DAY AS NEEDED, Disp: 180 tablet, Rfl: 1 .  traZODone (DESYREL) 50 MG tablet, TAKE 1 TABLET (50 MG TOTAL) BY MOUTH AT BEDTIME AS NEEDED. FOR SLEEP, Disp: 90 tablet, Rfl: 1 .  warfarin (COUMADIN) 4 MG tablet, TAKE 1 TABLET DAILY EXCEPT 1/2 ON SUNDAY AND THURSDAYS OR TAKE AS DIRECTED BY ANTICOAGULATION CLINIC. 90 DAY, Disp: 90 tablet, Rfl: 1   Julian Hy, DO [facility] 11/23/2018 3:46 PM

## 2018-11-23 NOTE — Patient Instructions (Signed)
Thank you for visiting Dr. Carlis Abbott at [facility]. We recommend the following: No orders of the defined types were placed in this encounter.  No orders of the defined types were placed in this encounter.   No orders of the defined types were placed in this encounter.   Return in about 3 months (around 02/23/2019).    Please do your part to reduce the spread of COVID-19.

## 2018-11-23 NOTE — Patient Instructions (Addendum)
Pre visit review using our clinic review tool, if applicable. No additional management support is needed unless otherwise documented below in the visit note.  Take 3 tablets today (11/18), take 1 1/2 tablets tomorrow and Friday (11/19) and (11/20) and then continue to take 1/2 tablet daily except 1 tablet on Mon Wed and Fridays. Dosing instructions given to  [name], RN @ [facility] - [phone].  Re-check in 12 days.

## 2018-11-23 NOTE — Progress Notes (Signed)
I have reviewed the results and agree with this plan   

## 2018-11-29 ENCOUNTER — Ambulatory Visit: Payer: Medicare Other | Admitting: Internal Medicine

## 2018-12-05 ENCOUNTER — Ambulatory Visit (INDEPENDENT_AMBULATORY_CARE_PROVIDER_SITE_OTHER): Payer: Medicare Other | Admitting: General Practice

## 2018-12-05 ENCOUNTER — Telehealth: Payer: Self-pay | Admitting: Internal Medicine

## 2018-12-05 DIAGNOSIS — Z7901 Long term (current) use of anticoagulants: Secondary | ICD-10-CM

## 2018-12-05 DIAGNOSIS — R8290 Unspecified abnormal findings in urine: Secondary | ICD-10-CM

## 2018-12-05 LAB — POCT INR: INR: 2.1 (ref 2.0–3.0)

## 2018-12-05 NOTE — Telephone Encounter (Signed)
Sandy Barrett aware of response. Orders put in for daughter to bring samples.

## 2018-12-05 NOTE — Telephone Encounter (Signed)
Ok to just do sample?

## 2018-12-05 NOTE — Patient Instructions (Signed)
Pre visit review using our clinic review tool, if applicable. No additional management support is needed unless otherwise documented below in the visit note.  Continue to take 1/2 tablet daily except 1 tablet on Mon Wed and Fridays. Dosing instructions given to  [name], RN @ [facility] - [phone].  Re-check in 2 weeks.

## 2018-12-05 NOTE — Telephone Encounter (Signed)
Pt has been starting to have confusion and wound care nurse and daughter has noticed a smell in Pt urine and wanted to see if Dr. Quay Burow wanted to get a urine sample for possible UTI

## 2018-12-05 NOTE — Telephone Encounter (Signed)
Okay to get sample

## 2018-12-06 ENCOUNTER — Encounter: Payer: Self-pay | Admitting: Internal Medicine

## 2018-12-06 DIAGNOSIS — N390 Urinary tract infection, site not specified: Secondary | ICD-10-CM | POA: Diagnosis not present

## 2018-12-08 NOTE — Progress Notes (Signed)
Agree with management.  Sandy Wickham J Naida Escalante, MD  

## 2018-12-09 ENCOUNTER — Telehealth: Payer: Self-pay | Admitting: Internal Medicine

## 2018-12-09 NOTE — Telephone Encounter (Signed)
LVM for daughter to call back in regards.  

## 2018-12-09 NOTE — Telephone Encounter (Signed)
Her preliminary urine results show probable infection, but her culture is still pending.  If she is doing well overall we can wait for the culture, but that may not come through until Monday.  We can start antibiotic, but her last culture showed 2 different bacteria and both were susceptible to different antibiotics.  The Augmentin may not be effective.  We could try Cipro, which she had last year-let me know what her daughter prefers

## 2018-12-10 ENCOUNTER — Encounter: Payer: Self-pay | Admitting: Internal Medicine

## 2018-12-12 MED ORDER — AMOXICILLIN-POT CLAVULANATE 875-125 MG PO TABS: 1.0000 | ORAL_TABLET | Freq: Two times a day (BID) | ORAL | 0 refills | Status: DC

## 2018-12-12 NOTE — Telephone Encounter (Signed)
° °  Pt returned [employer]

## 2018-12-12 NOTE — Telephone Encounter (Signed)
Please advise on culture. Laid it on your desk.

## 2018-12-12 NOTE — Telephone Encounter (Signed)
Infection is susceptible to augmentin - rx sent to [facility].

## 2018-12-12 NOTE — Telephone Encounter (Signed)
LVM letting daughter know. 

## 2018-12-15 DIAGNOSIS — M150 Primary generalized (osteo)arthritis: Secondary | ICD-10-CM | POA: Diagnosis not present

## 2018-12-15 DIAGNOSIS — D473 Essential (hemorrhagic) thrombocythemia: Secondary | ICD-10-CM | POA: Diagnosis not present

## 2018-12-15 DIAGNOSIS — K589 Irritable bowel syndrome without diarrhea: Secondary | ICD-10-CM | POA: Diagnosis not present

## 2018-12-15 DIAGNOSIS — I972 Postmastectomy lymphedema syndrome: Secondary | ICD-10-CM | POA: Diagnosis not present

## 2018-12-15 DIAGNOSIS — Z86711 Personal history of pulmonary embolism: Secondary | ICD-10-CM | POA: Diagnosis not present

## 2018-12-15 DIAGNOSIS — M25511 Pain in right shoulder: Secondary | ICD-10-CM | POA: Diagnosis not present

## 2018-12-15 DIAGNOSIS — L89153 Pressure ulcer of sacral region, stage 3: Secondary | ICD-10-CM | POA: Diagnosis not present

## 2018-12-15 DIAGNOSIS — I69351 Hemiplegia and hemiparesis following cerebral infarction affecting right dominant side: Secondary | ICD-10-CM | POA: Diagnosis not present

## 2018-12-15 DIAGNOSIS — I69320 Aphasia following cerebral infarction: Secondary | ICD-10-CM | POA: Diagnosis not present

## 2018-12-16 ENCOUNTER — Other Ambulatory Visit: Payer: Self-pay | Admitting: Internal Medicine

## 2018-12-16 DIAGNOSIS — L89153 Pressure ulcer of sacral region, stage 3: Secondary | ICD-10-CM | POA: Diagnosis not present

## 2018-12-16 DIAGNOSIS — I69320 Aphasia following cerebral infarction: Secondary | ICD-10-CM | POA: Diagnosis not present

## 2018-12-16 DIAGNOSIS — I69351 Hemiplegia and hemiparesis following cerebral infarction affecting right dominant side: Secondary | ICD-10-CM | POA: Diagnosis not present

## 2018-12-16 DIAGNOSIS — M150 Primary generalized (osteo)arthritis: Secondary | ICD-10-CM | POA: Diagnosis not present

## 2018-12-16 DIAGNOSIS — M25511 Pain in right shoulder: Secondary | ICD-10-CM | POA: Diagnosis not present

## 2018-12-16 DIAGNOSIS — I972 Postmastectomy lymphedema syndrome: Secondary | ICD-10-CM | POA: Diagnosis not present

## 2018-12-19 NOTE — Telephone Encounter (Signed)
[place] Controlled Database Checked Last filled:  11/16/18 # 180 LOV w/you: 10/18/18 Next appt w/you: None

## 2018-12-20 ENCOUNTER — Ambulatory Visit (INDEPENDENT_AMBULATORY_CARE_PROVIDER_SITE_OTHER): Payer: Medicare Other | Admitting: General Practice

## 2018-12-20 DIAGNOSIS — I69320 Aphasia following cerebral infarction: Secondary | ICD-10-CM | POA: Diagnosis not present

## 2018-12-20 DIAGNOSIS — Z7901 Long term (current) use of anticoagulants: Secondary | ICD-10-CM

## 2018-12-20 DIAGNOSIS — I825Z3 Chronic embolism and thrombosis of unspecified deep veins of distal lower extremity, bilateral: Secondary | ICD-10-CM

## 2018-12-20 DIAGNOSIS — I972 Postmastectomy lymphedema syndrome: Secondary | ICD-10-CM | POA: Diagnosis not present

## 2018-12-20 DIAGNOSIS — I69351 Hemiplegia and hemiparesis following cerebral infarction affecting right dominant side: Secondary | ICD-10-CM | POA: Diagnosis not present

## 2018-12-20 DIAGNOSIS — M25511 Pain in right shoulder: Secondary | ICD-10-CM | POA: Diagnosis not present

## 2018-12-20 DIAGNOSIS — L89153 Pressure ulcer of sacral region, stage 3: Secondary | ICD-10-CM | POA: Diagnosis not present

## 2018-12-20 DIAGNOSIS — M150 Primary generalized (osteo)arthritis: Secondary | ICD-10-CM | POA: Diagnosis not present

## 2018-12-20 LAB — POCT INR: INR: 1.6 — AB (ref 2.0–3.0)

## 2018-12-20 NOTE — Patient Instructions (Addendum)
Pre visit review using our clinic review tool, if applicable. No additional management support is needed unless otherwise documented below in the visit note.  Please take 6 mg today, 4 mg tomorrow and then continue to take 1/2 tablet daily except 1 tablet on Mon Wed and Fridays. Dosing instructions given to  [name], RN @ [facility] - [phone].  Re-check in 1 weeks.

## 2018-12-21 ENCOUNTER — Encounter: Payer: Self-pay | Admitting: Podiatry

## 2018-12-21 ENCOUNTER — Other Ambulatory Visit: Payer: Self-pay

## 2018-12-21 ENCOUNTER — Ambulatory Visit (INDEPENDENT_AMBULATORY_CARE_PROVIDER_SITE_OTHER): Payer: Medicare Other | Admitting: Podiatry

## 2018-12-21 ENCOUNTER — Telehealth: Payer: Self-pay

## 2018-12-21 DIAGNOSIS — D689 Coagulation defect, unspecified: Secondary | ICD-10-CM

## 2018-12-21 DIAGNOSIS — M79674 Pain in right toe(s): Secondary | ICD-10-CM | POA: Diagnosis not present

## 2018-12-21 DIAGNOSIS — B351 Tinea unguium: Secondary | ICD-10-CM

## 2018-12-21 DIAGNOSIS — M79675 Pain in left toe(s): Secondary | ICD-10-CM

## 2018-12-21 NOTE — Progress Notes (Signed)
Agree with management.  Stacy J Burns, MD  

## 2018-12-21 NOTE — Telephone Encounter (Signed)
Copied from [place] [id number]. Topic: General - Inquiry >> Dec 20, 2018  4:56 PM Berneta Levins wrote: Reason for CRM:  Shantel from [facility] calling to see if fax from 11/23/2018 for orders has been received and faxed back - they have all other orders, but this one. Order number: [id number]

## 2018-12-21 NOTE — Progress Notes (Signed)
Complaint:  Visit Type: Patient returns to my office for continued preventative foot care services.  She is accompanied by her daughter.   Complaint: Patient daughter says her mothers nails  have grown long and thick and become painful  Wearing shoes. shoes" Patient has been diagnosed with CVA.  . The patient presents for preventative foot care services. No changes to ROS.  Patient is taking coumadin. Patient presents to the office in a wheelchair with daughter.  Podiatric Exam: Vascular: dorsalis pedis and posterior tibial pulses are palpable bilateral. Capillary return is immediate. Temperature gradient is WNL. Skin turgor WNL  Sensorium: Deferred since patient is unable to communicate. Nail Exam: Pt has thick disfigured discolored nails with subungual debris noted bilateral entire nail hallux through fifth toenails Ulcer Exam: There is no evidence of ulcer or pre-ulcerative changes or infection. Orthopedic Exam: Muscle tone and strength are WNL. No limitations in general ROM. No crepitus or effusions noted. Foot type and digits show no abnormalities. Bony prominences are unremarkable. Skin: No Porokeratosis. No infection or ulcers  Diagnosis:  Onychomycosis, , Pain in right toe, pain in left toes  Treatment & Plan Procedures and Treatment: Consent by patient was obtained for treatment procedures.   Debridement of mycotic and hypertrophic toenails, 1 through 5 bilateral and clearing of subungual debris. No ulceration, no infection noted. ABN signed for 2020.[name] Return Visit-Office Procedure: Patient instructed to return to the office for a follow up visit 4 months for continued evaluation and treatment.    Gardiner Barefoot DPM

## 2018-12-21 NOTE — Telephone Encounter (Signed)
Called Shantel back and let her know we did not receive fax from that day. Gave direct fax number. Will sign and send back.

## 2018-12-22 DIAGNOSIS — I69351 Hemiplegia and hemiparesis following cerebral infarction affecting right dominant side: Secondary | ICD-10-CM | POA: Diagnosis not present

## 2018-12-22 DIAGNOSIS — I69320 Aphasia following cerebral infarction: Secondary | ICD-10-CM | POA: Diagnosis not present

## 2018-12-22 DIAGNOSIS — I972 Postmastectomy lymphedema syndrome: Secondary | ICD-10-CM | POA: Diagnosis not present

## 2018-12-22 DIAGNOSIS — L89153 Pressure ulcer of sacral region, stage 3: Secondary | ICD-10-CM | POA: Diagnosis not present

## 2018-12-22 DIAGNOSIS — M150 Primary generalized (osteo)arthritis: Secondary | ICD-10-CM | POA: Diagnosis not present

## 2018-12-22 DIAGNOSIS — M25511 Pain in right shoulder: Secondary | ICD-10-CM | POA: Diagnosis not present

## 2018-12-26 DIAGNOSIS — I972 Postmastectomy lymphedema syndrome: Secondary | ICD-10-CM | POA: Diagnosis not present

## 2018-12-26 DIAGNOSIS — I69320 Aphasia following cerebral infarction: Secondary | ICD-10-CM | POA: Diagnosis not present

## 2018-12-26 DIAGNOSIS — M150 Primary generalized (osteo)arthritis: Secondary | ICD-10-CM | POA: Diagnosis not present

## 2018-12-26 DIAGNOSIS — M25511 Pain in right shoulder: Secondary | ICD-10-CM | POA: Diagnosis not present

## 2018-12-26 DIAGNOSIS — I69351 Hemiplegia and hemiparesis following cerebral infarction affecting right dominant side: Secondary | ICD-10-CM | POA: Diagnosis not present

## 2018-12-26 DIAGNOSIS — L89153 Pressure ulcer of sacral region, stage 3: Secondary | ICD-10-CM | POA: Diagnosis not present

## 2018-12-27 ENCOUNTER — Encounter: Payer: Medicare Other | Attending: Physical Medicine & Rehabilitation | Admitting: Physical Medicine & Rehabilitation

## 2018-12-27 ENCOUNTER — Ambulatory Visit (INDEPENDENT_AMBULATORY_CARE_PROVIDER_SITE_OTHER): Payer: Medicare Other | Admitting: General Practice

## 2018-12-27 ENCOUNTER — Other Ambulatory Visit: Payer: Self-pay

## 2018-12-27 ENCOUNTER — Encounter: Payer: Self-pay | Admitting: Physical Medicine & Rehabilitation

## 2018-12-27 VITALS — BP 101/54 | HR 55 | Temp 97.5°F

## 2018-12-27 DIAGNOSIS — M1712 Unilateral primary osteoarthritis, left knee: Secondary | ICD-10-CM | POA: Diagnosis not present

## 2018-12-27 DIAGNOSIS — I825Z3 Chronic embolism and thrombosis of unspecified deep veins of distal lower extremity, bilateral: Secondary | ICD-10-CM

## 2018-12-27 DIAGNOSIS — Z7901 Long term (current) use of anticoagulants: Secondary | ICD-10-CM | POA: Diagnosis not present

## 2018-12-27 LAB — POCT INR: INR: 1.4 — AB (ref 2.0–3.0)

## 2018-12-27 NOTE — Progress Notes (Signed)
Knee injection LEFT   Indication:Left Knee pain not relieved by medication management and other conservative care.  Informed consent was obtained after describing risks and benefits of the procedure with the patient, this includes bleeding, bruising, infection and medication side effects. The patient wishes to proceed and has given written consent. The patient was placed in a recumbent position. The medial aspect of the knee was marked and prepped with Betadine and alcohol. It was then entered with a 25-gauge 1-1/2 inch needle inserted into the knee joint. After negative draw back for blood, a solution containing one ML of 6mg  per mL betamethasone and 3 mL of 1% lidocaine were injected. The patient tolerated the procedure well. Post procedure instructions were given.

## 2018-12-27 NOTE — Patient Instructions (Signed)
.  lbpcmh  Please take 6 mg today, tomorrow and Thursday and then continue to take 1/2 tablet daily except 1 tablet on Mon Wed and Fridays. Dosing instructions given to  [name], RN @ [facility] - [phone].  Re-check in 1 weeks.

## 2018-12-27 NOTE — Progress Notes (Signed)
Agree with management.  Emmanuella Mirante J Juliya Magill, MD  

## 2018-12-27 NOTE — Patient Instructions (Signed)
Adhesive Capsulitis  Adhesive capsulitis, also called frozen shoulder, causes the shoulder to become stiff and painful to move. This condition happens when there is inflammation of the tendons and ligaments that surround the shoulder joint (shoulder capsule). What are the causes? This condition may be caused by:  An injury to your shoulder joint.  Straining your shoulder.  Not moving your shoulder for a period of time. This can happen if your arm was injured or in a sling.  Long-standing conditions, such as: ? Diabetes. ? Thyroid problems. ? Heart disease. ? Stroke. ? Rheumatoid arthritis. ? Lung disease. In some cases, the cause is not known. What increases the risk? You are more likely to develop this condition if you are:  A woman.  Older than 83 years of age. What are the signs or symptoms? Symptoms of this condition include:  Pain in your shoulder when you move your arm. There may also be pain when parts of your shoulder are touched. The pain may be worse at night or when you are resting.  A sore or aching shoulder.  The inability to move your shoulder normally.  Muscle spasms. How is this diagnosed? This condition is diagnosed with a physical exam and imaging tests, such as an X-ray or MRI. How is this treated? This condition may be treated with:  Treatment of the underlying cause or condition.  Medicine. Medicine may be given to relieve pain, inflammation, or muscle spasms.  Steroid injections into the shoulder joint.  Physical therapy. This involves performing exercises to get the shoulder moving again.  Acupuncture. This is a type of treatment that involves stimulating specific points on your body by inserting thin needles through your skin.  Shoulder manipulation. This is a procedure to move the shoulder into another position. It is done after you are given a medicine to make you fall asleep (general anesthetic). The joint may also be injected with salt  water at high pressure to break down scarring.  Surgery. This may be done in severe cases when other treatments have failed. Although most people recover completely from adhesive capsulitis, some may not regain full shoulder movement. Follow these instructions at home: Managing pain, stiffness, and swelling      If directed, put ice on the injured area: ? Put ice in a plastic bag. ? Place a towel between your skin and the bag. ? Leave the ice on for 20 minutes, 2-3 times per day.  If directed, apply heat to the affected area before you exercise. Use the heat source that your health care provider recommends, such as a moist heat pack or a heating pad. ? Place a towel between your skin and the heat source. ? Leave the heat on for 20-30 minutes. ? Remove the heat if your skin turns bright red. This is especially important if you are unable to feel pain, heat, or cold. You may have a greater risk of getting burned. General instructions  Take over-the-counter and prescription medicines only as told by your health care provider.  If you are being treated with physical therapy, follow instructions from your physical therapist.  Avoid exercises that put a lot of demand on your shoulder, such as throwing. These exercises can make pain worse.  Keep all follow-up visits as told by your health care provider. This is important. Contact a health care provider if:  You develop new symptoms.  Your symptoms get worse. Summary  Adhesive capsulitis, also called frozen shoulder, causes the shoulder to become   stiff and painful to move.  You are more likely to have this condition if you are a woman and over age 40.  It is treated with physical therapy, medicines, and sometimes surgery. This information is not intended to replace advice given to you by your health care provider. Make sure you discuss any questions you have with your health care provider. Document Released: 10/19/2008 Document  Revised: 05/28/2017 Document Reviewed: 05/28/2017 Elsevier Patient Education  2020 Elsevier Inc.  

## 2018-12-29 ENCOUNTER — Other Ambulatory Visit: Payer: Self-pay | Admitting: Internal Medicine

## 2019-01-01 ENCOUNTER — Other Ambulatory Visit: Payer: Self-pay | Admitting: Internal Medicine

## 2019-01-02 ENCOUNTER — Telehealth: Payer: Self-pay | Admitting: Internal Medicine

## 2019-01-02 DIAGNOSIS — I69351 Hemiplegia and hemiparesis following cerebral infarction affecting right dominant side: Secondary | ICD-10-CM | POA: Diagnosis not present

## 2019-01-02 DIAGNOSIS — L89153 Pressure ulcer of sacral region, stage 3: Secondary | ICD-10-CM | POA: Diagnosis not present

## 2019-01-02 DIAGNOSIS — I69320 Aphasia following cerebral infarction: Secondary | ICD-10-CM | POA: Diagnosis not present

## 2019-01-02 DIAGNOSIS — M150 Primary generalized (osteo)arthritis: Secondary | ICD-10-CM | POA: Diagnosis not present

## 2019-01-02 DIAGNOSIS — M25511 Pain in right shoulder: Secondary | ICD-10-CM | POA: Diagnosis not present

## 2019-01-02 DIAGNOSIS — I972 Postmastectomy lymphedema syndrome: Secondary | ICD-10-CM | POA: Diagnosis not present

## 2019-01-02 NOTE — Telephone Encounter (Signed)
Shikirra Nurse Case Manager with [facility] is calling to report that the patient's INR 3.1 and the PT 33.7. Brock Ra is requesting a call back if necessary.  EF:[phone]

## 2019-01-02 NOTE — Telephone Encounter (Signed)
[place] Controlled Database Checked Last filled: 12/05/18 # 90 LOV: 10/18/18 Next appt: None  Please advise in PCP's absence. Thanks

## 2019-01-02 NOTE — Telephone Encounter (Signed)
Done erx 

## 2019-01-03 NOTE — Telephone Encounter (Signed)
Called and spoke with Shikirra today. She didn't have current dosage with her. I called daughter and left message for her to return call to clinic with current dosage to be advised.

## 2019-01-03 NOTE — Telephone Encounter (Signed)
I recommend that she stay on the current dose and recheck INR in 2-3 weeks  TJ

## 2019-01-03 NOTE — Telephone Encounter (Signed)
What is her current coumadin dose?  TJ

## 2019-01-03 NOTE — Telephone Encounter (Signed)
2 weeks.  

## 2019-01-04 NOTE — Telephone Encounter (Signed)
Called and left message with Brock Ra to recheck labs in 2 weeks and left my callback number is she had any questions or needed to speak to me.

## 2019-01-07 DIAGNOSIS — M150 Primary generalized (osteo)arthritis: Secondary | ICD-10-CM | POA: Diagnosis not present

## 2019-01-07 DIAGNOSIS — M25511 Pain in right shoulder: Secondary | ICD-10-CM | POA: Diagnosis not present

## 2019-01-07 DIAGNOSIS — I972 Postmastectomy lymphedema syndrome: Secondary | ICD-10-CM | POA: Diagnosis not present

## 2019-01-07 DIAGNOSIS — I69351 Hemiplegia and hemiparesis following cerebral infarction affecting right dominant side: Secondary | ICD-10-CM | POA: Diagnosis not present

## 2019-01-07 DIAGNOSIS — L89153 Pressure ulcer of sacral region, stage 3: Secondary | ICD-10-CM | POA: Diagnosis not present

## 2019-01-07 DIAGNOSIS — I69320 Aphasia following cerebral infarction: Secondary | ICD-10-CM | POA: Diagnosis not present

## 2019-01-11 DIAGNOSIS — M150 Primary generalized (osteo)arthritis: Secondary | ICD-10-CM | POA: Diagnosis not present

## 2019-01-11 DIAGNOSIS — I972 Postmastectomy lymphedema syndrome: Secondary | ICD-10-CM | POA: Diagnosis not present

## 2019-01-11 DIAGNOSIS — M25511 Pain in right shoulder: Secondary | ICD-10-CM | POA: Diagnosis not present

## 2019-01-11 DIAGNOSIS — I69320 Aphasia following cerebral infarction: Secondary | ICD-10-CM | POA: Diagnosis not present

## 2019-01-11 DIAGNOSIS — L89153 Pressure ulcer of sacral region, stage 3: Secondary | ICD-10-CM | POA: Diagnosis not present

## 2019-01-11 DIAGNOSIS — I69351 Hemiplegia and hemiparesis following cerebral infarction affecting right dominant side: Secondary | ICD-10-CM | POA: Diagnosis not present

## 2019-01-12 ENCOUNTER — Telehealth: Payer: Self-pay | Admitting: Internal Medicine

## 2019-01-12 DIAGNOSIS — M150 Primary generalized (osteo)arthritis: Secondary | ICD-10-CM | POA: Diagnosis not present

## 2019-01-12 DIAGNOSIS — M25511 Pain in right shoulder: Secondary | ICD-10-CM | POA: Diagnosis not present

## 2019-01-12 DIAGNOSIS — L89153 Pressure ulcer of sacral region, stage 3: Secondary | ICD-10-CM | POA: Diagnosis not present

## 2019-01-12 DIAGNOSIS — I972 Postmastectomy lymphedema syndrome: Secondary | ICD-10-CM | POA: Diagnosis not present

## 2019-01-12 DIAGNOSIS — I69351 Hemiplegia and hemiparesis following cerebral infarction affecting right dominant side: Secondary | ICD-10-CM | POA: Diagnosis not present

## 2019-01-12 DIAGNOSIS — I69320 Aphasia following cerebral infarction: Secondary | ICD-10-CM | POA: Diagnosis not present

## 2019-01-12 NOTE — Telephone Encounter (Signed)
Chakilrra calling from [facility] stated that they recertified pt for wound care. 1x8

## 2019-01-12 NOTE — Telephone Encounter (Signed)
noted 

## 2019-01-13 DIAGNOSIS — I69320 Aphasia following cerebral infarction: Secondary | ICD-10-CM | POA: Diagnosis not present

## 2019-01-13 DIAGNOSIS — I69351 Hemiplegia and hemiparesis following cerebral infarction affecting right dominant side: Secondary | ICD-10-CM | POA: Diagnosis not present

## 2019-01-13 DIAGNOSIS — M25511 Pain in right shoulder: Secondary | ICD-10-CM | POA: Diagnosis not present

## 2019-01-13 DIAGNOSIS — M150 Primary generalized (osteo)arthritis: Secondary | ICD-10-CM | POA: Diagnosis not present

## 2019-01-13 DIAGNOSIS — L89153 Pressure ulcer of sacral region, stage 3: Secondary | ICD-10-CM | POA: Diagnosis not present

## 2019-01-13 DIAGNOSIS — I972 Postmastectomy lymphedema syndrome: Secondary | ICD-10-CM | POA: Diagnosis not present

## 2019-01-14 DIAGNOSIS — M150 Primary generalized (osteo)arthritis: Secondary | ICD-10-CM | POA: Diagnosis not present

## 2019-01-14 DIAGNOSIS — M25511 Pain in right shoulder: Secondary | ICD-10-CM | POA: Diagnosis not present

## 2019-01-14 DIAGNOSIS — L89153 Pressure ulcer of sacral region, stage 3: Secondary | ICD-10-CM | POA: Diagnosis not present

## 2019-01-14 DIAGNOSIS — D473 Essential (hemorrhagic) thrombocythemia: Secondary | ICD-10-CM | POA: Diagnosis not present

## 2019-01-14 DIAGNOSIS — Z86711 Personal history of pulmonary embolism: Secondary | ICD-10-CM | POA: Diagnosis not present

## 2019-01-14 DIAGNOSIS — I972 Postmastectomy lymphedema syndrome: Secondary | ICD-10-CM | POA: Diagnosis not present

## 2019-01-14 DIAGNOSIS — I69320 Aphasia following cerebral infarction: Secondary | ICD-10-CM | POA: Diagnosis not present

## 2019-01-14 DIAGNOSIS — K589 Irritable bowel syndrome without diarrhea: Secondary | ICD-10-CM | POA: Diagnosis not present

## 2019-01-14 DIAGNOSIS — I69351 Hemiplegia and hemiparesis following cerebral infarction affecting right dominant side: Secondary | ICD-10-CM | POA: Diagnosis not present

## 2019-01-16 DIAGNOSIS — D473 Essential (hemorrhagic) thrombocythemia: Secondary | ICD-10-CM

## 2019-01-16 DIAGNOSIS — K589 Irritable bowel syndrome without diarrhea: Secondary | ICD-10-CM | POA: Diagnosis not present

## 2019-01-16 DIAGNOSIS — M150 Primary generalized (osteo)arthritis: Secondary | ICD-10-CM | POA: Diagnosis not present

## 2019-01-16 DIAGNOSIS — I69320 Aphasia following cerebral infarction: Secondary | ICD-10-CM | POA: Diagnosis not present

## 2019-01-16 DIAGNOSIS — Z86711 Personal history of pulmonary embolism: Secondary | ICD-10-CM | POA: Diagnosis not present

## 2019-01-16 DIAGNOSIS — M25511 Pain in right shoulder: Secondary | ICD-10-CM | POA: Diagnosis not present

## 2019-01-16 DIAGNOSIS — I69351 Hemiplegia and hemiparesis following cerebral infarction affecting right dominant side: Secondary | ICD-10-CM

## 2019-01-16 DIAGNOSIS — L89153 Pressure ulcer of sacral region, stage 3: Secondary | ICD-10-CM | POA: Diagnosis not present

## 2019-01-16 DIAGNOSIS — I972 Postmastectomy lymphedema syndrome: Secondary | ICD-10-CM | POA: Diagnosis not present

## 2019-01-17 ENCOUNTER — Ambulatory Visit (INDEPENDENT_AMBULATORY_CARE_PROVIDER_SITE_OTHER): Payer: Medicare Other | Admitting: Family

## 2019-01-17 ENCOUNTER — Telehealth: Payer: Self-pay | Admitting: Internal Medicine

## 2019-01-17 DIAGNOSIS — I69351 Hemiplegia and hemiparesis following cerebral infarction affecting right dominant side: Secondary | ICD-10-CM | POA: Diagnosis not present

## 2019-01-17 DIAGNOSIS — K219 Gastro-esophageal reflux disease without esophagitis: Secondary | ICD-10-CM | POA: Diagnosis not present

## 2019-01-17 DIAGNOSIS — R631 Polydipsia: Secondary | ICD-10-CM

## 2019-01-17 DIAGNOSIS — L89153 Pressure ulcer of sacral region, stage 3: Secondary | ICD-10-CM | POA: Diagnosis not present

## 2019-01-17 DIAGNOSIS — M25511 Pain in right shoulder: Secondary | ICD-10-CM | POA: Diagnosis not present

## 2019-01-17 DIAGNOSIS — I972 Postmastectomy lymphedema syndrome: Secondary | ICD-10-CM | POA: Diagnosis not present

## 2019-01-17 DIAGNOSIS — I69320 Aphasia following cerebral infarction: Secondary | ICD-10-CM | POA: Diagnosis not present

## 2019-01-17 DIAGNOSIS — M150 Primary generalized (osteo)arthritis: Secondary | ICD-10-CM | POA: Diagnosis not present

## 2019-01-17 NOTE — Telephone Encounter (Signed)
Seeing Mickel Baas for this.

## 2019-01-17 NOTE — Progress Notes (Signed)
[name] is a 86 y.o. female with the following history as recorded in EpicCare:  Patient Active Problem List   Diagnosis Date Noted  . Decubitus ulcer of back, unstageable ([place]) 10/25/2018  . Right breast lymphedema 10/18/2018  . Hematoma of arm, left, initial encounter 07/29/2018  . Sleep difficulties 05/24/2018  . Right shoulder pain 11/24/2017  . Hallucinations 11/24/2017  . Adhesive capsulitis of right shoulder 11/23/2017  . Bilateral leg edema 05/24/2017  . Cholecystitis 02/23/2017  . Chronic respiratory failure with hypoxia ([place]) 02/23/2017  . Neuralgia 01/09/2017  . Venous congestion 10/08/2016  . Lymphedema of breast 09/11/2016  . Long term (current) use of anticoagulants [Z79.01] 09/10/2016  . Left hip pain 08/09/2016  . Left leg swelling 05/18/2016  . Pressure ulcer 04/27/2016  . Aspiration pneumonia ([place]) 04/20/2016  . Hemidiaphragm paralysis   . Wheelchair bound 02/03/2016  . Discoloration of skin 09/26/2015  . Essential thrombocytosis ([place]) 07/31/2015  . SVC (superior vena cava obstruction), chronic 07/20/2015  . Dysphagia 07/20/2015  . Constipation 07/20/2015  . Thrombophlebitis of breast, right 06/18/2015  . Tracheal deviation 06/06/2015  . Sensorineural hearing loss, bilateral, moderate-moderately severe 04/30/2015  . Abdominal wall lump 03/09/2015  . Frequent UTI 02/06/2015  . Primary osteoarthritis involving multiple joints 07/26/2014  . Pernicious anemia 07/26/2014  . Encounter for therapeutic drug monitoring 07/17/2014  . Spastic hemiparesis affecting dominant side ([place]), right 05/31/2014  . DVT of lower extremity, bilateral ([place]) 03/14/2014  . Global aphasia 03/14/2014  . 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 Aphasia S/P CVA 03/13/2014  . Cerebral infarction due to embolism of left middle cerebral artery ([place])   . Stroke, embolic ([place]) [id number]  . History of pulmonary embolism 03/08/2014  . Primary localized osteoarthrosis, lower leg  03/06/2013  . IBS (irritable bowel syndrome)   . Multinodular goiter 01/13/2011  . Hyperthyroidism 11/11/2009  . Osteoarthritis 04/21/2008  . Migraine headache 04/20/2008    Current Outpatient Medications  Medication Sig Dispense Refill  . acetaminophen (TYLENOL) 160 MG/5ML liquid Take 480 mg by mouth every 4 (four) hours as needed for fever or pain.    [name] amoxicillin-clavulanate (AUGMENTIN) 875-125 MG tablet Take 1 tablet by mouth 2 (two) times daily. 14 tablet 0  . Calamine-Zinc Oxide LOTN Apply topically.    . ciclopirox (PENLAC) 8 % solution APPLY TOPICALLY AT BEDTIME OVER NAIL & SURROUNDING SKIN,APPLY DAILY OVER PREVIOUS COAT,AFTER 7 DAYS REMOVE WITH ALCOHOL AND CONTINUE CYCLE 6.6 mL 5  . diclofenac sodium (VOLTAREN) 1 % GEL APPLY 2 G TOPICALLY 3 (THREE) TIMES DAILY AS NEEDED. 300 g 0  . enoxaparin (LOVENOX) 80 MG/0.8ML injection INJECT 0.8 MLS SUBCUTANEOUSLY EVERY 12 HOURS FOR 4 DAYS    . hydroxyurea (HYDREA) 500 MG capsule TAKE 1 TABLET (500 MG) DAILY EXCEPT 2 TABLETS ( 1000 MG ) ON WEDNESDAY, SATURDAY AND SUNDAY 360 capsule 1  . ipratropium-albuterol (DUONEB) 0.5-2.5 (3) MG/3ML SOLN Take 3 mLs by nebulization every 6 (six) hours as needed (shortness of breath). 360 mL 3  . lidocaine (LIDODERM) 5 % Place 1 patch daily onto the skin. Remove & Discard patch within 12 hours or as directed by MD (Patient taking differently: Place 1 patch onto the skin daily as needed (pain). Remove & Discard patch within 12 hours or as directed by MD) 15 patch 0  . methimazole (TAPAZOLE) 5 MG tablet TAKE 1 TABLET 2 TIMES A WEEK 8 tablet 4  . NONFORMULARY OR COMPOUNDED ITEM Topical cream with equal parts diclofenac, gabapentin, lidocaine,  menthol  Disp 100 gm 1 each 5  . Oral Hygiene Products (Q-CARE COVERD Y[place]AUER/SUCTION) MISC Use as directed 10 each 5  . OXYGEN 2lpm 24/7  DME- [facility]    . polyethylene glycol (MIRALAX / GLYCOLAX) packet TAKE 17G BY MOUTH TWICE A DAY (Patient taking differently: Take 17 g by  mouth 2 (two) times daily as needed for mild constipation. ) 14 packet 1  . pregabalin (LYRICA) 50 MG capsule TAKE 1 CAPSULE (50 MG TOTAL) BY MOUTH 3 (THREE) TIMES DAILY. 90 capsule 0  . Probiotic Product (ALIGN) 4 MG CAPS Take 4 mg by mouth daily.     . propranolol (INDERAL) 10 MG tablet TAKE 0.5 TABLETS (5 MG TOTAL) BY MOUTH 2 (TWO) TIMES DAILY. 60 tablet 2  . PROTONIX 40 MG PACK TAKE 20 MLS (40 MG TOTAL) BY MOUTH DAILY. 40 mL 2  . traMADol (ULTRAM) 50 MG tablet TAKE 2 TABLETS BY MOUTH 3 TIMES A DAY AS NEEDED 180 tablet 1  . traZODone (DESYREL) 50 MG tablet TAKE 1 TABLET (50 MG TOTAL) BY MOUTH AT BEDTIME AS NEEDED. FOR SLEEP 90 tablet 1  . warfarin (COUMADIN) 4 MG tablet TAKE 1 TABLET DAILY EXCEPT 1/2 ON SUNDAY AND THURSDAYS OR TAKE AS DIRECTED BY ANTICOAGULATION CLINIC. 90 DAY 90 tablet 1   No current facility-administered medications for this visit.    Allergies: Valtrex [valacyclovir hcl]  Past Medical History:  Diagnosis Date  . ARTHRITIS   . Arthritis   . Diverticulosis   . Essential thrombocytosis ([place])   . HYPERTENSION   . HYPERTHYROIDISM   . Hyperthyroidism    s/p I-131 ablation 03/2011 of multinod goiter  . INSOMNIA   . MIGRAINE HEADACHE   . OBESITY   . Posttraumatic stress disorder   . Pulmonary embolism ([place]) 03/2014   with DVT  . Stroke [facility]) 03/2014   dysarthria    Past Surgical History:  Procedure Laterality Date  . ABDOMINAL HYSTERECTOMY  1976  . BREAST SURGERY     biopsy  . RADIOLOGY WITH ANESTHESIA Left 03/08/2014   Procedure: RADIOLOGY WITH ANESTHESIA;  Surgeon: Rob Hickman, MD;  Location: [facility];  Service: Radiology;  Laterality: Left;  . TONSILLECTOMY    . TOTAL HIP ARTHROPLASTY  1998    right    Family History  Problem Relation Age of Onset  . Asthma Mother   . Asthma Father   . Prostate cancer Father   . Stroke Brother   . Breast cancer Sister   . Stomach cancer Sister   . Thyroid disease Neg Hx     Social History   Tobacco Use  .  Smoking status: Never Smoker  . Smokeless tobacco: Never Used  Substance Use Topics  . Alcohol use: No    Alcohol/week: 0.0 standard drinks    Subjective:    I connected with [name] Soliman on 01/17/19 at 12:40 PM EST by a video enabled telemedicine application and verified that I am speaking with the correct person using two identifiers.  Provider in office/ patient is at home; provider and , patient's daughter and patient are only 3 people on video call. The audio failed on the Doxy so the visit had to be converted to a phone call.     I discussed the limitations of evaluation and management by telemedicine and the availability of in person appointments. The patient expressed understanding and agreed to proceed.  Patient's daughter provides the information as the patient is a stroke survivor and  suffers from aphasia. The patient's daughter wanted to clarify what her mother is taking for GERD; she has been using Protonix once per day and wonders about alternatives to help with the symptoms.   The patient's daughter is also requesting that a message be forwarded to patient's PCP to let Dr. Quay Burow know that Mrs. Reale is now drinking 2-3 glasses of water per day; daughter is not overly concerned but wanted Dr. Quay Burow to be aware of this positive change.    Objective:  There were no vitals filed for this visit.  Lungs: Respirations unlabored;   Assessment:  1. Gastroesophageal reflux disease, unspecified whether esophagitis present   2. Increased thirst     Plan:  Try increasing the Protonix to bid; patient's daughter will call back next week with her mother's response; could also consider trial of Carafate if symptoms persist;  Will notify patient's PCP about positive change; daughter was not concerned about any other symptoms but did want PCP to be aware.   Time spent 15 minutes  No follow-ups on file.  No orders of the defined types were placed in this encounter.   Requested  Prescriptions    No prescriptions requested or ordered in this encounter

## 2019-01-17 NOTE — Telephone Encounter (Addendum)
Patient scheduled for 12:40 today with [facility]  Copied from [place] [id number]. Topic: General - Inquiry >> Jan 17, 2019 10:04 AM Richardo Priest, NT wrote: Reason for CRM: Pt's daughter called in stating she would like to know if PCP can give anything for acid reflux. Pt's daughter would also like to inform PCP that pt has started to increase water intake by a few glasses. Please advise.

## 2019-01-18 ENCOUNTER — Encounter: Payer: Self-pay | Admitting: Internal Medicine

## 2019-01-18 ENCOUNTER — Ambulatory Visit (INDEPENDENT_AMBULATORY_CARE_PROVIDER_SITE_OTHER): Payer: Medicare Other | Admitting: General Practice

## 2019-01-18 DIAGNOSIS — Z7901 Long term (current) use of anticoagulants: Secondary | ICD-10-CM | POA: Diagnosis not present

## 2019-01-18 LAB — POCT INR: INR: 2.3 (ref 2.0–3.0)

## 2019-01-18 NOTE — Patient Instructions (Signed)
Pre visit review using our clinic review tool, if applicable. No additional management support is needed unless otherwise documented below in the visit note.  Continue to take 1/2 tablet daily except 1 tablet on Mon Wed and Fridays. Dosing instructions given to  [name], RN @ [facility] - [phone].  Re-check in 2 weeks.

## 2019-01-18 NOTE — Progress Notes (Signed)
I have reviewed and agree with note, evaluation, plan.   Palin Tristan, MD  

## 2019-01-24 DIAGNOSIS — I972 Postmastectomy lymphedema syndrome: Secondary | ICD-10-CM | POA: Diagnosis not present

## 2019-01-24 DIAGNOSIS — M25511 Pain in right shoulder: Secondary | ICD-10-CM | POA: Diagnosis not present

## 2019-01-24 DIAGNOSIS — L89153 Pressure ulcer of sacral region, stage 3: Secondary | ICD-10-CM | POA: Diagnosis not present

## 2019-01-24 DIAGNOSIS — M150 Primary generalized (osteo)arthritis: Secondary | ICD-10-CM | POA: Diagnosis not present

## 2019-01-24 DIAGNOSIS — I69351 Hemiplegia and hemiparesis following cerebral infarction affecting right dominant side: Secondary | ICD-10-CM | POA: Diagnosis not present

## 2019-01-24 DIAGNOSIS — I69320 Aphasia following cerebral infarction: Secondary | ICD-10-CM | POA: Diagnosis not present

## 2019-01-26 ENCOUNTER — Other Ambulatory Visit: Payer: Self-pay

## 2019-01-26 ENCOUNTER — Encounter: Payer: Medicare Other | Attending: Physical Medicine & Rehabilitation | Admitting: Physical Medicine & Rehabilitation

## 2019-01-26 ENCOUNTER — Other Ambulatory Visit: Payer: Self-pay | Admitting: Internal Medicine

## 2019-01-26 ENCOUNTER — Encounter: Payer: Self-pay | Admitting: Physical Medicine & Rehabilitation

## 2019-01-26 VITALS — BP 141/80 | HR 76 | Temp 97.7°F

## 2019-01-26 DIAGNOSIS — M7501 Adhesive capsulitis of right shoulder: Secondary | ICD-10-CM

## 2019-01-26 DIAGNOSIS — M25511 Pain in right shoulder: Secondary | ICD-10-CM | POA: Diagnosis not present

## 2019-01-26 DIAGNOSIS — G8929 Other chronic pain: Secondary | ICD-10-CM | POA: Diagnosis not present

## 2019-01-26 DIAGNOSIS — G8110 Spastic hemiplegia affecting unspecified side: Secondary | ICD-10-CM

## 2019-01-26 DIAGNOSIS — M1712 Unilateral primary osteoarthritis, left knee: Secondary | ICD-10-CM

## 2019-01-26 NOTE — Patient Instructions (Addendum)
Please call if there is a worsening of the right shoulder pain.  If there is a worsening of the left knee pain we can repeat injection but not sooner than March 27, 2019

## 2019-01-26 NOTE — Progress Notes (Signed)
Subjective:    Patient ID: Sandy Barrett, female    DOB: 1932-01-07, 86 y.o.   MRN: [id number]  HPI 86 year old female with history of left MCA distribution infarct causing global aphasia and right hemiplegia.  She also has a history of bilateral knee pain related to osteoarthritis.  She has responded well to left knee injection with corticosteroid last performed approximately 1 month ago. She returns today with complaints of shoulder pain.  She does have a history of hemiplegic shoulder pain with subluxation.  She has had no falls or trauma.  At rest her daughter states that she does not have much pain.  When she is getting dressed or to bathe there is some pain when the arm is elevated.  The patient is aphasic so she cannot give pain levels.  She will wince when her arm is elevated however  Pain Inventory Average Pain 3 Pain Right Now 3 My pain is intermittent  In the last 24 hours, has pain interfered with the following? General activity 0 Relation with others 0 Enjoyment of life 0 What TIME of day is your pain at its worst? varies Sleep (in general) NA  Pain is worse with: some activites Pain improves with: rest and injections Relief from Meds: n/a  Mobility use a wheelchair needs help with transfers  Function retired  Neuro/Psych trouble walking confusion  Prior Studies Any changes since last visit?  no  Physicians involved in your care Any changes since last visit?  no   Family History  Problem Relation Age of Onset  . Asthma Mother   . Asthma Father   . Prostate cancer Father   . Stroke Brother   . Breast cancer Sister   . Stomach cancer Sister   . Thyroid disease Neg Hx    Social History   Socioeconomic History  . Marital status: Widowed    Spouse name: Not on file  . Number of children: 2  . Years of education: 67  . Highest education level: Not on file  Occupational History  . Occupation: retired    Comment: Restaurant manager, fast food  Tobacco Use    . Smoking status: Never Smoker  . Smokeless tobacco: Never Used  Substance and Sexual Activity  . Alcohol use: No    Alcohol/week: 0.0 standard drinks  . Drug use: No  . Sexual activity: Never  Other Topics Concern  . Not on file  Social History Narrative   Widowed, lived alone prior to CVA 03/2014   [facility]NF at [facility], then home 07/2014 with dtr   Right handed   Caffeine use- occasionally drinks tea   Social Determinants of Health   Financial Resource Strain:   . Difficulty of Paying Living Expenses: Not on file  Food Insecurity:   . Worried About Charity fundraiser in the Last Year: Not on file  . Ran Out of Food in the Last Year: Not on file  Transportation Needs:   . Lack of Transportation (Medical): Not on file  . Lack of Transportation (Non-Medical): Not on file  Physical Activity:   . Days of Exercise per Week: Not on file  . Minutes of Exercise per Session: Not on file  Stress:   . Feeling of Stress : Not on file  Social Connections:   . Frequency of Communication with Friends and Family: Not on file  . Frequency of Social Gatherings with Friends and Family: Not on file  . Attends Religious Services: Not on file  .  Active Member of Clubs or Organizations: Not on file  . Attends Archivist Meetings: Not on file  . Marital Status: Not on file   Past Surgical History:  Procedure Laterality Date  . ABDOMINAL HYSTERECTOMY  1976  . BREAST SURGERY     biopsy  . RADIOLOGY WITH ANESTHESIA Left 03/08/2014   Procedure: RADIOLOGY WITH ANESTHESIA;  Surgeon: Rob Hickman, MD;  Location: [facility];  Service: Radiology;  Laterality: Left;  . TONSILLECTOMY    . TOTAL HIP ARTHROPLASTY  1998    right   Past Medical History:  Diagnosis Date  . ARTHRITIS   . Arthritis   . Diverticulosis   . Essential thrombocytosis ([place])   . HYPERTENSION   . HYPERTHYROIDISM   . Hyperthyroidism    s/p I-131 ablation 03/2011 of multinod goiter  . INSOMNIA   . MIGRAINE  HEADACHE   . OBESITY   . Posttraumatic stress disorder   . Pulmonary embolism ([place]) 03/2014   with DVT  . Stroke ([place]) 03/2014   dysarthria   BP (!) 141/80   Pulse 76   Temp 97.7 F (36.5 C)   SpO2 90%   Opioid Risk Score:   Fall Risk Score:  `1  Depression screen PHQ 2/9  Depression screen [facility] 2/9 08/17/2017 07/20/2017  Decreased Interest 0 0  Down, Depressed, Hopeless 0 0  PHQ - 2 Score 0 0  Some recent data might be hidden    Review of Systems  Constitutional: Negative.   HENT: Negative.   Respiratory: Negative.   Cardiovascular: Negative.   Gastrointestinal: Negative.   Endocrine: Negative.   Genitourinary: Negative.   Musculoskeletal: Positive for arthralgias and gait problem.       Spasticity   Skin: Negative.   Allergic/Immunologic: Negative.   Neurological: Positive for speech difficulty.  Hematological: Negative.   Psychiatric/Behavioral: Negative.   All other systems reviewed and are negative.      Objective:   Physical Exam Vitals and nursing note reviewed.  Constitutional:      Appearance: She is obese.  HENT:     Head: Normocephalic and atraumatic.  Eyes:     Extraocular Movements: Extraocular movements intact.     Conjunctiva/sclera: Conjunctivae normal.     Pupils: Pupils are equal, round, and reactive to light.  Musculoskeletal:     Right shoulder: Deformity present. No effusion. Decreased range of motion.     Comments: The patient has positive impingement sign at 90 degrees.  She has pain with endrange external rotation of the arm and there is limited external rotation to about 20 degrees on the right side.  There is pain initially with knee flexion extension however after providing additional passive range of motion she no longer winces.  She has no pain with left knee range of motion.  Neurological:     Mental Status: She is alert.     Comments: Motor strength is 0/5 in the right deltoid, bicep, tricep, finger flexors and extensors as  well as right hip flexor knee extensor ankle dorsiflexor plantar flexor   Psychiatric:        Mood and Affect: Mood normal.        Behavior: Behavior normal.           Assessment & Plan:  #1.  Right hemiplegia following stroke.  She has a history of global aphasia and is unable to communicate pain verbally. Overall at rest the patient appears to be comfortable.  She does have limitation  of right shoulder range of motion and does have x-ray evidence of glenohumeral arthritis as well as clinical signs of adhesive capsulitis.  We discussed that if pain during ADLs becomes more apparent she may be a good candidate for ultrasound-guided right shoulder joint injection. Daughter will monitor the situation and call us if there is some worsening of activity related pain. 2.  Bilateral knee osteoarthritis status post successful injection left knee.  We discussed is difficult to predict duration of response.  We would not repeat injection sooner than 3 months after the last injection.  That would be no sooner than 03/27/2019.  Daughter will call about this as well.

## 2019-01-26 NOTE — Telephone Encounter (Signed)
Check [place] registry last filled 01/02/2019.Marland KitchenJohny Barrett

## 2019-01-30 DIAGNOSIS — R5381 Other malaise: Secondary | ICD-10-CM | POA: Diagnosis not present

## 2019-01-30 DIAGNOSIS — E042 Nontoxic multinodular goiter: Secondary | ICD-10-CM | POA: Diagnosis not present

## 2019-01-30 DIAGNOSIS — I69359 Hemiplegia and hemiparesis following cerebral infarction affecting unspecified side: Secondary | ICD-10-CM | POA: Diagnosis not present

## 2019-01-30 DIAGNOSIS — L89323 Pressure ulcer of left buttock, stage 3: Secondary | ICD-10-CM | POA: Diagnosis not present

## 2019-01-30 DIAGNOSIS — L89154 Pressure ulcer of sacral region, stage 4: Secondary | ICD-10-CM | POA: Diagnosis not present

## 2019-02-01 ENCOUNTER — Ambulatory Visit (INDEPENDENT_AMBULATORY_CARE_PROVIDER_SITE_OTHER): Payer: Medicare Other | Admitting: General Practice

## 2019-02-01 DIAGNOSIS — Z7901 Long term (current) use of anticoagulants: Secondary | ICD-10-CM | POA: Diagnosis not present

## 2019-02-01 DIAGNOSIS — M150 Primary generalized (osteo)arthritis: Secondary | ICD-10-CM | POA: Diagnosis not present

## 2019-02-01 DIAGNOSIS — M25511 Pain in right shoulder: Secondary | ICD-10-CM | POA: Diagnosis not present

## 2019-02-01 DIAGNOSIS — I972 Postmastectomy lymphedema syndrome: Secondary | ICD-10-CM | POA: Diagnosis not present

## 2019-02-01 DIAGNOSIS — L89153 Pressure ulcer of sacral region, stage 3: Secondary | ICD-10-CM | POA: Diagnosis not present

## 2019-02-01 DIAGNOSIS — I69351 Hemiplegia and hemiparesis following cerebral infarction affecting right dominant side: Secondary | ICD-10-CM | POA: Diagnosis not present

## 2019-02-01 DIAGNOSIS — I69320 Aphasia following cerebral infarction: Secondary | ICD-10-CM | POA: Diagnosis not present

## 2019-02-01 LAB — POCT INR: INR: 1.8 — AB (ref 2.0–3.0)

## 2019-02-01 NOTE — Progress Notes (Signed)
I have reviewed the results and agree with this plan   

## 2019-02-01 NOTE — Patient Instructions (Addendum)
Pre visit review using our clinic review tool, if applicable. No additional management support is needed unless otherwise documented below in the visit note.  Take extra 1/2 tablet today and then continue to take 1/2 tablet daily except 1 tablet on Mon Wed and Fridays. Dosing instructions given to  [name], RN @ [facility] - [phone].  Re-check in 2 weeks.

## 2019-02-05 DIAGNOSIS — M25511 Pain in right shoulder: Secondary | ICD-10-CM | POA: Diagnosis not present

## 2019-02-05 DIAGNOSIS — M150 Primary generalized (osteo)arthritis: Secondary | ICD-10-CM | POA: Diagnosis not present

## 2019-02-05 DIAGNOSIS — L89153 Pressure ulcer of sacral region, stage 3: Secondary | ICD-10-CM | POA: Diagnosis not present

## 2019-02-05 DIAGNOSIS — I69320 Aphasia following cerebral infarction: Secondary | ICD-10-CM | POA: Diagnosis not present

## 2019-02-05 DIAGNOSIS — I972 Postmastectomy lymphedema syndrome: Secondary | ICD-10-CM | POA: Diagnosis not present

## 2019-02-05 DIAGNOSIS — I69351 Hemiplegia and hemiparesis following cerebral infarction affecting right dominant side: Secondary | ICD-10-CM | POA: Diagnosis not present

## 2019-02-07 LAB — POCT INR: INR: 2 (ref 2.0–3.0)

## 2019-02-08 ENCOUNTER — Telehealth: Payer: Self-pay

## 2019-02-08 NOTE — Telephone Encounter (Signed)
Okay 

## 2019-02-08 NOTE — Telephone Encounter (Signed)
Orders faxed over. Daughter aware.

## 2019-02-08 NOTE — Telephone Encounter (Signed)
Patient's daughter, [name], calling and states that she thinks that her mom has a UTI. States that if the order is placed in time, [facility] could get the urine and test it. Please advise.

## 2019-02-08 NOTE — Telephone Encounter (Signed)
Ok for urine

## 2019-02-09 NOTE — Progress Notes (Addendum)
Subjective:    Patient ID: Sandy Barrett, female    DOB: 04-29-1932, 86 y.o.   MRN: [id number]  HPI The patient is here for an acute visit.  Her daughter is here and provides the history because she is nonverbal.   She is taking all of her medications as prescribed.    She is having increased thirst for the past month -- drinking more than usual.  She usually only has a few sips, but recently has been drinking a whole and asking for more.    Her eyes are bulging intermittently over past week.  They are not bulging now.  The white of her eyes do not get real white any more.  She is unsure if this is a concern.  Her thyroid function has been in the normal range.   She is having change in mood, very talkative at times - changes in behavior. This often occurs when she has a UTI. Urine was collected to rule out an UTI.    No obvious cold symptoms, no obvious pain.   She has chronic pressure ulcers and has home health wound care coming.  She is not able to change the dressing herself - it requires at least two people.  They are currently only coming once a week, but insurance will pay for them to come three times a week (she called and checked with medicare).  She needs assistance three times a week to do the wound care.  They are getting better, but slowly.     She has urinary incontinence with continuous leakage.  She needs a purewick to help prevent sores and help her chronic pressure ulcers from getting worse/heal.  This will be a lifelong issue due to her stroke and being bedbound/wheelchair bound.  It is essential her skin stays dry to prevent sores and prevent worsening of her pressure ulcers.  It is very difficulty to change her diaper - it take at least two people.  The [facility] system is medically necessary for her.    Medications and allergies reviewed with patient and updated if appropriate.  Patient Active Problem List   Diagnosis Date Noted  . Decubitus ulcer of back,  unstageable ([place]) 10/25/2018  . Right breast lymphedema 10/18/2018  . Hematoma of arm, left, initial encounter 07/29/2018  . Sleep difficulties 05/24/2018  . Right shoulder pain 11/24/2017  . Hallucinations 11/24/2017  . Adhesive capsulitis of right shoulder 11/23/2017  . Bilateral leg edema 05/24/2017  . Cholecystitis 02/23/2017  . Chronic respiratory failure with hypoxia ([place]) 02/23/2017  . Neuralgia 01/09/2017  . Venous congestion 10/08/2016  . Lymphedema of breast 09/11/2016  . Long term (current) use of anticoagulants [Z79.01] 09/10/2016  . Left hip pain 08/09/2016  . Left leg swelling 05/18/2016  . Pressure ulcer 04/27/2016  . Aspiration pneumonia ([place]) 04/20/2016  . Hemidiaphragm paralysis   . Wheelchair bound 02/03/2016  . Discoloration of skin 09/26/2015  . Essential thrombocytosis ([place]) 07/31/2015  . SVC (superior vena cava obstruction), chronic 07/20/2015  . Dysphagia 07/20/2015  . Constipation 07/20/2015  . Thrombophlebitis of breast, right 06/18/2015  . Tracheal deviation 06/06/2015  . Sensorineural hearing loss, bilateral, moderate-moderately severe 04/30/2015  . Abdominal wall lump 03/09/2015  . Frequent UTI 02/06/2015  . Primary osteoarthritis involving multiple joints 07/26/2014  . Pernicious anemia 07/26/2014  . Encounter for therapeutic drug monitoring 07/17/2014  . Spastic hemiparesis affecting dominant side ([place]), right 05/31/2014  . DVT of lower extremity, bilateral ([place]) 03/14/2014  . Global  aphasia 03/14/2014  . 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 Aphasia S/P CVA 03/13/2014  . Cerebral infarction due to embolism of left middle cerebral artery ([place])   . Stroke, embolic ([place]) 123456  . History of pulmonary embolism 03/08/2014  . Primary localized osteoarthrosis, lower leg 03/06/2013  . IBS (irritable bowel syndrome)   . Multinodular goiter 01/13/2011  . Hyperthyroidism 11/11/2009  . Osteoarthritis 04/21/2008  . Migraine headache 04/20/2008     Current Outpatient Medications on File Prior to Visit  Medication Sig Dispense Refill  . acetaminophen (TYLENOL) 160 MG/5ML liquid Take 480 mg by mouth every 4 (four) hours as needed for fever or pain.    . Calamine-Zinc Oxide LOTN Apply topically.    . ciclopirox (PENLAC) 8 % solution APPLY TOPICALLY AT BEDTIME OVER NAIL & SURROUNDING SKIN,APPLY DAILY OVER PREVIOUS COAT,AFTER 7 DAYS REMOVE WITH ALCOHOL AND CONTINUE CYCLE 6.6 mL 5  . diclofenac Sodium (VOLTAREN) 1 % GEL APPLY 2 G TOPICALLY 3 (THREE) TIMES DAILY AS NEEDED. 300 g 0  . enoxaparin (LOVENOX) 80 MG/0.8ML injection INJECT 0.8 MLS SUBCUTANEOUSLY EVERY 12 HOURS FOR 4 DAYS    . hydroxyurea (HYDREA) 500 MG capsule TAKE 1 TABLET (500 MG) DAILY EXCEPT 2 TABLETS ( 1000 MG ) ON WEDNESDAY, SATURDAY AND SUNDAY 360 capsule 1  . ipratropium-albuterol (DUONEB) 0.5-2.5 (3) MG/3ML SOLN Take 3 mLs by nebulization every 6 (six) hours as needed (shortness of breath). 360 mL 3  . lidocaine (LIDODERM) 5 % Place 1 patch daily onto the skin. Remove & Discard patch within 12 hours or as directed by MD (Patient taking differently: Place 1 patch onto the skin daily as needed (pain). Remove & Discard patch within 12 hours or as directed by MD) 15 patch 0  . methimazole (TAPAZOLE) 5 MG tablet TAKE 1 TABLET 2 TIMES A WEEK 8 tablet 4  . NONFORMULARY OR COMPOUNDED ITEM Topical cream with equal parts diclofenac, gabapentin, lidocaine, menthol  Disp 100 gm 1 each 5  . Oral Hygiene Products (Q-CARE COVERD [place]/SUCTION) MISC Use as directed 10 each 5  . OXYGEN 2lpm 24/7  DME- [facility]    . polyethylene glycol (MIRALAX / GLYCOLAX) packet TAKE 17G BY MOUTH TWICE A DAY (Patient taking differently: Take 17 g by mouth 2 (two) times daily as needed for mild constipation. ) 14 packet 1  . pregabalin (LYRICA) 50 MG capsule TAKE 1 CAPSULE (50 MG TOTAL) BY MOUTH 3 (THREE) TIMES DAILY. 90 capsule 0  . Probiotic Product (ALIGN) 4 MG CAPS Take 4 mg by mouth daily.     .  propranolol (INDERAL) 10 MG tablet TAKE 0.5 TABLETS (5 MG TOTAL) BY MOUTH 2 (TWO) TIMES DAILY. 60 tablet 2  . PROTONIX 40 MG PACK TAKE 20 MLS (40 MG TOTAL) BY MOUTH DAILY. 40 mL 2  . traMADol (ULTRAM) 50 MG tablet TAKE 2 TABLETS BY MOUTH 3 TIMES A DAY AS NEEDED 180 tablet 1  . traZODone (DESYREL) 50 MG tablet TAKE 1 TABLET (50 MG TOTAL) BY MOUTH AT BEDTIME AS NEEDED. FOR SLEEP 90 tablet 1  . warfarin (COUMADIN) 4 MG tablet TAKE 1 TABLET DAILY EXCEPT 1/2 ON SUNDAY AND THURSDAYS OR TAKE AS DIRECTED BY ANTICOAGULATION CLINIC. 90 DAY 90 tablet 1   No current facility-administered medications on file prior to visit.    Past Medical History:  Diagnosis Date  . ARTHRITIS   . Arthritis   . Diverticulosis   . Essential thrombocytosis ([place])   . HYPERTENSION   .  HYPERTHYROIDISM   . Hyperthyroidism    s/p I-131 ablation 03/2011 of multinod goiter  . INSOMNIA   . MIGRAINE HEADACHE   . OBESITY   . Posttraumatic stress disorder   . Pulmonary embolism ([place]) 03/2014   with DVT  . Stroke [facility]) 03/2014   dysarthria    Past Surgical History:  Procedure Laterality Date  . ABDOMINAL HYSTERECTOMY  1976  . BREAST SURGERY     biopsy  . RADIOLOGY WITH ANESTHESIA Left 03/08/2014   Procedure: RADIOLOGY WITH ANESTHESIA;  Surgeon: Rob Hickman, MD;  Location: [facility];  Service: Radiology;  Laterality: Left;  . TONSILLECTOMY    . TOTAL HIP ARTHROPLASTY  1998    right    Social History   Socioeconomic History  . Marital status: Widowed    Spouse name: Not on file  . Number of children: 2  . Years of education: 63  . Highest education level: Not on file  Occupational History  . Occupation: retired    Comment: Restaurant manager, fast food  Tobacco Use  . Smoking status: Never Smoker  . Smokeless tobacco: Never Used  Substance and Sexual Activity  . Alcohol use: No    Alcohol/week: 0.0 standard drinks  . Drug use: No  . Sexual activity: Never  Other Topics Concern  . Not on file  Social History  Narrative   Widowed, lived alone prior to CVA 03/2014   SNF at [facility], then home 07/2014 with dtr   Right handed   Caffeine use- occasionally drinks tea   Social Determinants of Health   Financial Resource Strain:   . Difficulty of Paying Living Expenses: Not on file  Food Insecurity:   . Worried About Charity fundraiser in the Last Year: Not on file  . Ran Out of Food in the Last Year: Not on file  Transportation Needs:   . Lack of Transportation (Medical): Not on file  . Lack of Transportation (Non-Medical): Not on file  Physical Activity:   . Days of Exercise per Week: Not on file  . Minutes of Exercise per Session: Not on file  Stress:   . Feeling of Stress : Not on file  Social Connections:   . Frequency of Communication with Friends and Family: Not on file  . Frequency of Social Gatherings with Friends and Family: Not on file  . Attends Religious Services: Not on file  . Active Member of Clubs or Organizations: Not on file  . Attends Archivist Meetings: Not on file  . Marital Status: Not on file    Family History  Problem Relation Age of Onset  . Asthma Mother   . Asthma Father   . Prostate cancer Father   . Stroke Brother   . Breast cancer Sister   . Stomach cancer Sister   . Thyroid disease Neg Hx     Review of Systems  Unable to perform ROS: Patient nonverbal       Objective:   Vitals:   02/10/19 1502  BP: 132/86  Resp: 16  Temp: 98.3 F (36.8 C)   BP Readings from Last 3 Encounters:  02/10/19 132/86  01/26/19 (!) 141/80  12/27/18 (!) 101/54   Wt Readings from Last 3 Encounters:  11/23/18 176 lb (79.8 kg)  08/30/18 176 lb (79.8 kg)  05/24/18 175 lb (79.4 kg)   There is no height or weight on file to calculate BMI.   Physical Exam Constitutional:  Appearance: Normal appearance. She is not ill-appearing.  HENT:     Head: Normocephalic and atraumatic.  Eyes:     General: No scleral icterus.       Right eye: No  discharge.        Left eye: No discharge.     Comments: Eyes do not appear to be bulging  Cardiovascular:     Rate and Rhythm: Normal rate and regular rhythm.     Comments: RUE lymphedema Pulmonary:     Effort: Pulmonary effort is normal. No respiratory distress.     Breath sounds: No wheezing or rales.  Abdominal:     Palpations: Abdomen is soft.     Tenderness: There is no abdominal tenderness.  Musculoskeletal:     Right lower leg: Edema (1+ ) present.     Left lower leg: Edema (1+) present.  Skin:    General: Skin is warm and dry.  Neurological:     Mental Status: She is alert.            Assessment & Plan:    See Problem List for Assessment and Plan of chronic medical problems.    This visit occurred during the SARS-CoV-2 public health emergency.  Safety protocols were in place, including screening questions prior to the visit, additional usage of staff PPE, and extensive cleaning of exam room while observing appropriate contact time as indicated for disinfecting solutions.

## 2019-02-10 ENCOUNTER — Ambulatory Visit (INDEPENDENT_AMBULATORY_CARE_PROVIDER_SITE_OTHER): Payer: Medicare Other | Admitting: Internal Medicine

## 2019-02-10 ENCOUNTER — Encounter: Payer: Self-pay | Admitting: Internal Medicine

## 2019-02-10 ENCOUNTER — Other Ambulatory Visit: Payer: Self-pay

## 2019-02-10 VITALS — BP 132/86 | Temp 98.3°F | Resp 16

## 2019-02-10 DIAGNOSIS — G479 Sleep disorder, unspecified: Secondary | ICD-10-CM | POA: Diagnosis not present

## 2019-02-10 DIAGNOSIS — L8990 Pressure ulcer of unspecified site, unspecified stage: Secondary | ICD-10-CM | POA: Diagnosis not present

## 2019-02-10 DIAGNOSIS — R600 Localized edema: Secondary | ICD-10-CM

## 2019-02-10 DIAGNOSIS — I972 Postmastectomy lymphedema syndrome: Secondary | ICD-10-CM | POA: Diagnosis not present

## 2019-02-10 DIAGNOSIS — H579 Unspecified disorder of eye and adnexa: Secondary | ICD-10-CM

## 2019-02-10 DIAGNOSIS — E0590 Thyrotoxicosis, unspecified without thyrotoxic crisis or storm: Secondary | ICD-10-CM | POA: Diagnosis not present

## 2019-02-10 DIAGNOSIS — L89153 Pressure ulcer of sacral region, stage 3: Secondary | ICD-10-CM | POA: Diagnosis not present

## 2019-02-10 DIAGNOSIS — K219 Gastro-esophageal reflux disease without esophagitis: Secondary | ICD-10-CM

## 2019-02-10 DIAGNOSIS — N390 Urinary tract infection, site not specified: Secondary | ICD-10-CM | POA: Diagnosis not present

## 2019-02-10 DIAGNOSIS — M150 Primary generalized (osteo)arthritis: Secondary | ICD-10-CM | POA: Diagnosis not present

## 2019-02-10 DIAGNOSIS — I69320 Aphasia following cerebral infarction: Secondary | ICD-10-CM | POA: Diagnosis not present

## 2019-02-10 DIAGNOSIS — R631 Polydipsia: Secondary | ICD-10-CM

## 2019-02-10 DIAGNOSIS — M792 Neuralgia and neuritis, unspecified: Secondary | ICD-10-CM

## 2019-02-10 DIAGNOSIS — R739 Hyperglycemia, unspecified: Secondary | ICD-10-CM | POA: Diagnosis not present

## 2019-02-10 DIAGNOSIS — M25511 Pain in right shoulder: Secondary | ICD-10-CM | POA: Diagnosis not present

## 2019-02-10 DIAGNOSIS — I69351 Hemiplegia and hemiparesis following cerebral infarction affecting right dominant side: Secondary | ICD-10-CM | POA: Diagnosis not present

## 2019-02-10 DIAGNOSIS — N3942 Incontinence without sensory awareness: Secondary | ICD-10-CM

## 2019-02-10 LAB — COMPREHENSIVE METABOLIC PANEL
ALT: 9 U/L (ref 0–35)
AST: 13 U/L (ref 0–37)
Albumin: 3.5 g/dL (ref 3.5–5.2)
Alkaline Phosphatase: 95 U/L (ref 39–117)
BUN: 15 mg/dL (ref 6–23)
CO2: 29 mEq/L (ref 19–32)
Calcium: 9.1 mg/dL (ref 8.4–10.5)
Chloride: 102 mEq/L (ref 96–112)
Creatinine, Ser: 0.35 mg/dL — ABNORMAL LOW (ref 0.40–1.20)
GFR: 214.41 mL/min (ref >60.00)
Glucose, Bld: 79 mg/dL (ref 70–99)
Potassium: 4.2 mEq/L (ref 3.5–5.1)
Sodium: 139 mEq/L (ref 135–145)
Total Bilirubin: 0.5 mg/dL (ref 0.2–1.2)
Total Protein: 7.7 g/dL (ref 6.0–8.3)

## 2019-02-10 LAB — CBC WITH DIFFERENTIAL/PLATELET
Basophils Absolute: 0.1 10*3/uL (ref 0.0–0.1)
Basophils Relative: 0.7 % (ref 0.0–3.0)
Eosinophils Absolute: 0.3 10*3/uL (ref 0.0–0.7)
Eosinophils Relative: 1.8 % (ref 0.0–5.0)
HCT: 38.7 % (ref 36.0–46.0)
Hemoglobin: 12.3 g/dL (ref 12.0–15.0)
Lymphocytes Relative: 10.3 % — ABNORMAL LOW (ref 12.0–46.0)
Lymphs Abs: 1.4 10*3/uL (ref 0.7–4.0)
MCHC: 31.8 g/dL (ref 30.0–36.0)
MCV: 104.7 fl — ABNORMAL HIGH (ref 78.0–100.0)
Monocytes Absolute: 0.5 10*3/uL (ref 0.1–1.0)
Monocytes Relative: 3.4 % (ref 3.0–12.0)
Neutro Abs: 11.8 10*3/uL — ABNORMAL HIGH (ref 1.4–7.7)
Neutrophils Relative %: 83.8 % — ABNORMAL HIGH (ref 43.0–77.0)
Platelets: 730 10*3/uL — ABNORMAL HIGH (ref 150.0–400.0)
RBC: 3.7 Mil/uL — ABNORMAL LOW (ref 3.87–5.11)
RDW: 19.6 % — ABNORMAL HIGH (ref 11.5–15.5)
WBC: 14.1 10*3/uL — ABNORMAL HIGH (ref 4.0–10.5)

## 2019-02-10 LAB — T4, FREE: Free T4: 1.34 ng/dL (ref 0.60–1.60)

## 2019-02-10 LAB — TSH: TSH: 1.59 u[IU]/mL (ref 0.35–4.50)

## 2019-02-10 LAB — T3, FREE: T3, Free: 2.7 pg/mL (ref 2.3–4.2)

## 2019-02-10 LAB — HEMOGLOBIN A1C: Hgb A1c MFr Bld: 4.7 % (ref 4.6–6.5)

## 2019-02-10 MED ORDER — PANTOPRAZOLE SODIUM 40 MG PO PACK: 40.0000 mg | PACK | Freq: Two times a day (BID) | ORAL | 3 refills | Status: DC

## 2019-02-10 NOTE — Patient Instructions (Addendum)
  Blood work was ordered.     Medications reviewed and updated.  Changes include :   none  Your prescription(s) have been submitted to your pharmacy. Please take as directed and contact our office if you believe you are having problem(s) with the medication(s).   Please followup in 6 months   

## 2019-02-12 ENCOUNTER — Encounter: Payer: Self-pay | Admitting: Internal Medicine

## 2019-02-12 DIAGNOSIS — H579 Unspecified disorder of eye and adnexa: Secondary | ICD-10-CM | POA: Insufficient documentation

## 2019-02-12 DIAGNOSIS — K219 Gastro-esophageal reflux disease without esophagitis: Secondary | ICD-10-CM | POA: Insufficient documentation

## 2019-02-12 NOTE — Assessment & Plan Note (Signed)
>   1 pressure ulcer Has home health nurse for wound care coming once a week - she needs someone to come three times a week because she is unable to do the wound care by herself - it is a two person job Will request wound care to go 3/week - it will be covered by insurance

## 2019-02-12 NOTE — Assessment & Plan Note (Signed)
Eyes appear normal today - no bulging appearance Just monitor

## 2019-02-12 NOTE — Assessment & Plan Note (Signed)
chronic Following with endocrine Will check tfts since we are getting blood work today

## 2019-02-12 NOTE — Assessment & Plan Note (Signed)
Chronic  Related to strokes Continue lyrica 50 mg three times a day and tramadol Unable to tell how controlled her pain is since she is nonverbal

## 2019-02-12 NOTE — Assessment & Plan Note (Signed)
Chronic  Improved compared to last visit controlled

## 2019-02-12 NOTE — Assessment & Plan Note (Signed)
Chronic reflux, dysphagia Improvement in swallowing,reflux with increased protonix dose Continue protonix twice daily

## 2019-02-12 NOTE — Assessment & Plan Note (Signed)
Chronic Controlled, stable Continue current dose of medication trazodone 

## 2019-02-13 ENCOUNTER — Encounter: Payer: Self-pay | Admitting: Internal Medicine

## 2019-02-13 DIAGNOSIS — I69351 Hemiplegia and hemiparesis following cerebral infarction affecting right dominant side: Secondary | ICD-10-CM | POA: Diagnosis not present

## 2019-02-13 DIAGNOSIS — L89153 Pressure ulcer of sacral region, stage 3: Secondary | ICD-10-CM | POA: Diagnosis not present

## 2019-02-13 DIAGNOSIS — D473 Essential (hemorrhagic) thrombocythemia: Secondary | ICD-10-CM | POA: Diagnosis not present

## 2019-02-13 DIAGNOSIS — I972 Postmastectomy lymphedema syndrome: Secondary | ICD-10-CM | POA: Diagnosis not present

## 2019-02-13 DIAGNOSIS — I69320 Aphasia following cerebral infarction: Secondary | ICD-10-CM | POA: Diagnosis not present

## 2019-02-13 DIAGNOSIS — M25511 Pain in right shoulder: Secondary | ICD-10-CM | POA: Diagnosis not present

## 2019-02-13 DIAGNOSIS — Z86711 Personal history of pulmonary embolism: Secondary | ICD-10-CM | POA: Diagnosis not present

## 2019-02-13 DIAGNOSIS — M150 Primary generalized (osteo)arthritis: Secondary | ICD-10-CM | POA: Diagnosis not present

## 2019-02-13 DIAGNOSIS — K589 Irritable bowel syndrome without diarrhea: Secondary | ICD-10-CM | POA: Diagnosis not present

## 2019-02-14 ENCOUNTER — Ambulatory Visit (INDEPENDENT_AMBULATORY_CARE_PROVIDER_SITE_OTHER): Payer: Medicare Other | Admitting: General Practice

## 2019-02-14 ENCOUNTER — Telehealth: Payer: Self-pay

## 2019-02-14 DIAGNOSIS — Z7901 Long term (current) use of anticoagulants: Secondary | ICD-10-CM

## 2019-02-14 DIAGNOSIS — I69351 Hemiplegia and hemiparesis following cerebral infarction affecting right dominant side: Secondary | ICD-10-CM | POA: Diagnosis not present

## 2019-02-14 DIAGNOSIS — L89153 Pressure ulcer of sacral region, stage 3: Secondary | ICD-10-CM | POA: Diagnosis not present

## 2019-02-14 DIAGNOSIS — I69320 Aphasia following cerebral infarction: Secondary | ICD-10-CM | POA: Diagnosis not present

## 2019-02-14 DIAGNOSIS — I972 Postmastectomy lymphedema syndrome: Secondary | ICD-10-CM | POA: Diagnosis not present

## 2019-02-14 DIAGNOSIS — M150 Primary generalized (osteo)arthritis: Secondary | ICD-10-CM | POA: Diagnosis not present

## 2019-02-14 DIAGNOSIS — M25511 Pain in right shoulder: Secondary | ICD-10-CM | POA: Diagnosis not present

## 2019-02-14 NOTE — Patient Instructions (Signed)
Pre visit review using our clinic review tool, if applicable. No additional management support is needed unless otherwise documented below in the visit note.  Continue to take 1/2 tablet daily except 1 tablet on Mon Wed and Fridays. Dosing instructions given to  [name], RN @ [facility] - [phone].  Re-check in 2 weeks.

## 2019-02-14 NOTE — Telephone Encounter (Signed)
Called [facility] and gave verbal orders for wound care below.

## 2019-02-14 NOTE — Telephone Encounter (Signed)
-----   Message from [name], MD sent at 02/10/2019  3:40 PM EST ----- Need wound care nurse with [facility] to go and do wound dressing changes 3 tmes a week - cna we send the m an order

## 2019-02-19 ENCOUNTER — Other Ambulatory Visit: Payer: Self-pay | Admitting: Internal Medicine

## 2019-02-20 NOTE — Telephone Encounter (Signed)
Last OV 02/10/19 Last RF 01/17/19

## 2019-02-21 DIAGNOSIS — I69320 Aphasia following cerebral infarction: Secondary | ICD-10-CM | POA: Diagnosis not present

## 2019-02-21 DIAGNOSIS — I972 Postmastectomy lymphedema syndrome: Secondary | ICD-10-CM | POA: Diagnosis not present

## 2019-02-21 DIAGNOSIS — L89153 Pressure ulcer of sacral region, stage 3: Secondary | ICD-10-CM | POA: Diagnosis not present

## 2019-02-21 DIAGNOSIS — M25511 Pain in right shoulder: Secondary | ICD-10-CM | POA: Diagnosis not present

## 2019-02-21 DIAGNOSIS — M150 Primary generalized (osteo)arthritis: Secondary | ICD-10-CM | POA: Diagnosis not present

## 2019-02-21 DIAGNOSIS — I69351 Hemiplegia and hemiparesis following cerebral infarction affecting right dominant side: Secondary | ICD-10-CM | POA: Diagnosis not present

## 2019-02-28 ENCOUNTER — Ambulatory Visit (INDEPENDENT_AMBULATORY_CARE_PROVIDER_SITE_OTHER): Payer: Medicare Other | Admitting: General Practice

## 2019-02-28 DIAGNOSIS — M25511 Pain in right shoulder: Secondary | ICD-10-CM | POA: Diagnosis not present

## 2019-02-28 DIAGNOSIS — M150 Primary generalized (osteo)arthritis: Secondary | ICD-10-CM | POA: Diagnosis not present

## 2019-02-28 DIAGNOSIS — I69351 Hemiplegia and hemiparesis following cerebral infarction affecting right dominant side: Secondary | ICD-10-CM | POA: Diagnosis not present

## 2019-02-28 DIAGNOSIS — Z7901 Long term (current) use of anticoagulants: Secondary | ICD-10-CM

## 2019-02-28 DIAGNOSIS — I69320 Aphasia following cerebral infarction: Secondary | ICD-10-CM | POA: Diagnosis not present

## 2019-02-28 DIAGNOSIS — I972 Postmastectomy lymphedema syndrome: Secondary | ICD-10-CM | POA: Diagnosis not present

## 2019-02-28 DIAGNOSIS — L89153 Pressure ulcer of sacral region, stage 3: Secondary | ICD-10-CM | POA: Diagnosis not present

## 2019-02-28 DIAGNOSIS — I824Y3 Acute embolism and thrombosis of unspecified deep veins of proximal lower extremity, bilateral: Secondary | ICD-10-CM

## 2019-02-28 LAB — POCT INR: INR: 1.9 — AB (ref 2.0–3.0)

## 2019-02-28 NOTE — Patient Instructions (Signed)
Pre visit review using our clinic review tool, if applicable. No additional management support is needed unless otherwise documented below in the visit note.  Take 1 tablet today (2/23) and then continue to take 1/2 tablet daily except 1 tablet on Mon Wed and Fridays. Dosing instructions given to  [name], RN @ [facility] - [phone].  Re-check in 2 weeks.

## 2019-03-01 NOTE — Progress Notes (Signed)
Agree with management.  Sandy Barrett [name], MD  

## 2019-03-03 ENCOUNTER — Telehealth: Payer: Self-pay | Admitting: Internal Medicine

## 2019-03-03 ENCOUNTER — Other Ambulatory Visit: Payer: Self-pay | Admitting: Internal Medicine

## 2019-03-03 MED ORDER — PREGABALIN 50 MG PO CAPS: 50.0000 mg | ORAL_CAPSULE | Freq: Three times a day (TID) | ORAL | 0 refills | Status: DC

## 2019-03-03 NOTE — Progress Notes (Signed)
  Chronic Care Management   Outreach Note  03/03/2019 Name: Sandy Barrett MRN: [id number] DOB: 07-May-1932  Referred by: [name], MD Reason for referral : No chief complaint on file.   An unsuccessful telephone outreach was attempted today. The patient was referred to the pharmacist for assistance with care management and care coordination.   Follow Up Plan:   Raynicia Dukes [employer] Scheduler

## 2019-03-03 NOTE — Telephone Encounter (Signed)
Last RF 02/01/19 Last OV 02/10/19

## 2019-03-03 NOTE — Telephone Encounter (Signed)
New message:   1.Medication Requested:pregabalin (LYRICA) 50 MG capsule  2. Pharmacy (Name, Street, City):[id number] - [place], [place] - [place] [address]  3. On Med List: Yes  4. Last Visit with PCP: 02/10/19  5. Next visit date with PCP: 08/15/19

## 2019-03-09 DIAGNOSIS — I69359 Hemiplegia and hemiparesis following cerebral infarction affecting unspecified side: Secondary | ICD-10-CM | POA: Diagnosis not present

## 2019-03-09 DIAGNOSIS — E042 Nontoxic multinodular goiter: Secondary | ICD-10-CM | POA: Diagnosis not present

## 2019-03-09 DIAGNOSIS — R5381 Other malaise: Secondary | ICD-10-CM | POA: Diagnosis not present

## 2019-03-09 DIAGNOSIS — L89154 Pressure ulcer of sacral region, stage 4: Secondary | ICD-10-CM | POA: Diagnosis not present

## 2019-03-09 DIAGNOSIS — L89323 Pressure ulcer of left buttock, stage 3: Secondary | ICD-10-CM | POA: Diagnosis not present

## 2019-03-10 LAB — POCT INR: INR: 1.4 — AB (ref 2.0–3.0)

## 2019-03-11 DIAGNOSIS — I972 Postmastectomy lymphedema syndrome: Secondary | ICD-10-CM | POA: Diagnosis not present

## 2019-03-11 DIAGNOSIS — I69351 Hemiplegia and hemiparesis following cerebral infarction affecting right dominant side: Secondary | ICD-10-CM | POA: Diagnosis not present

## 2019-03-11 DIAGNOSIS — I69320 Aphasia following cerebral infarction: Secondary | ICD-10-CM | POA: Diagnosis not present

## 2019-03-11 DIAGNOSIS — L89153 Pressure ulcer of sacral region, stage 3: Secondary | ICD-10-CM | POA: Diagnosis not present

## 2019-03-11 DIAGNOSIS — M150 Primary generalized (osteo)arthritis: Secondary | ICD-10-CM | POA: Diagnosis not present

## 2019-03-11 DIAGNOSIS — M25511 Pain in right shoulder: Secondary | ICD-10-CM | POA: Diagnosis not present

## 2019-03-15 DIAGNOSIS — M150 Primary generalized (osteo)arthritis: Secondary | ICD-10-CM | POA: Diagnosis not present

## 2019-03-15 DIAGNOSIS — Z7901 Long term (current) use of anticoagulants: Secondary | ICD-10-CM | POA: Diagnosis not present

## 2019-03-15 DIAGNOSIS — D473 Essential (hemorrhagic) thrombocythemia: Secondary | ICD-10-CM | POA: Diagnosis not present

## 2019-03-15 DIAGNOSIS — Z86711 Personal history of pulmonary embolism: Secondary | ICD-10-CM | POA: Diagnosis not present

## 2019-03-15 DIAGNOSIS — I69320 Aphasia following cerebral infarction: Secondary | ICD-10-CM | POA: Diagnosis not present

## 2019-03-15 DIAGNOSIS — I69351 Hemiplegia and hemiparesis following cerebral infarction affecting right dominant side: Secondary | ICD-10-CM | POA: Diagnosis not present

## 2019-03-15 DIAGNOSIS — K589 Irritable bowel syndrome without diarrhea: Secondary | ICD-10-CM | POA: Diagnosis not present

## 2019-03-15 DIAGNOSIS — Z5181 Encounter for therapeutic drug level monitoring: Secondary | ICD-10-CM | POA: Diagnosis not present

## 2019-03-15 DIAGNOSIS — I972 Postmastectomy lymphedema syndrome: Secondary | ICD-10-CM | POA: Diagnosis not present

## 2019-03-15 DIAGNOSIS — L89153 Pressure ulcer of sacral region, stage 3: Secondary | ICD-10-CM | POA: Diagnosis not present

## 2019-03-15 DIAGNOSIS — L89323 Pressure ulcer of left buttock, stage 3: Secondary | ICD-10-CM | POA: Diagnosis not present

## 2019-03-17 ENCOUNTER — Telehealth: Payer: Self-pay | Admitting: Internal Medicine

## 2019-03-17 DIAGNOSIS — I69351 Hemiplegia and hemiparesis following cerebral infarction affecting right dominant side: Secondary | ICD-10-CM | POA: Diagnosis not present

## 2019-03-17 DIAGNOSIS — L89323 Pressure ulcer of left buttock, stage 3: Secondary | ICD-10-CM | POA: Diagnosis not present

## 2019-03-17 DIAGNOSIS — L89153 Pressure ulcer of sacral region, stage 3: Secondary | ICD-10-CM | POA: Diagnosis not present

## 2019-03-17 DIAGNOSIS — M150 Primary generalized (osteo)arthritis: Secondary | ICD-10-CM | POA: Diagnosis not present

## 2019-03-17 DIAGNOSIS — I972 Postmastectomy lymphedema syndrome: Secondary | ICD-10-CM | POA: Diagnosis not present

## 2019-03-17 DIAGNOSIS — I69320 Aphasia following cerebral infarction: Secondary | ICD-10-CM | POA: Diagnosis not present

## 2019-03-17 LAB — POCT INR: INR: 1.4 — AB (ref 2.0–3.0)

## 2019-03-17 NOTE — Telephone Encounter (Signed)
Pt's daughter aware.

## 2019-03-17 NOTE — Telephone Encounter (Signed)
New message:   [name] from [facility] is calling and states the patient has a 1.4 INR today. Please advise.

## 2019-03-17 NOTE — Telephone Encounter (Signed)
INR low at 1.4   Warfarin Friday and Saturday 6 mg  Then resume normal dosing   F/u INR in one week

## 2019-03-20 ENCOUNTER — Encounter: Payer: Self-pay | Admitting: Internal Medicine

## 2019-03-20 ENCOUNTER — Ambulatory Visit (INDEPENDENT_AMBULATORY_CARE_PROVIDER_SITE_OTHER): Payer: Medicare Other | Admitting: General Practice

## 2019-03-20 ENCOUNTER — Telehealth: Payer: Self-pay | Admitting: Internal Medicine

## 2019-03-20 ENCOUNTER — Telehealth: Payer: Self-pay

## 2019-03-20 DIAGNOSIS — I69320 Aphasia following cerebral infarction: Secondary | ICD-10-CM | POA: Diagnosis not present

## 2019-03-20 DIAGNOSIS — I972 Postmastectomy lymphedema syndrome: Secondary | ICD-10-CM | POA: Diagnosis not present

## 2019-03-20 DIAGNOSIS — L89153 Pressure ulcer of sacral region, stage 3: Secondary | ICD-10-CM | POA: Diagnosis not present

## 2019-03-20 DIAGNOSIS — L89323 Pressure ulcer of left buttock, stage 3: Secondary | ICD-10-CM | POA: Diagnosis not present

## 2019-03-20 DIAGNOSIS — Z7901 Long term (current) use of anticoagulants: Secondary | ICD-10-CM

## 2019-03-20 DIAGNOSIS — M150 Primary generalized (osteo)arthritis: Secondary | ICD-10-CM | POA: Diagnosis not present

## 2019-03-20 DIAGNOSIS — I69351 Hemiplegia and hemiparesis following cerebral infarction affecting right dominant side: Secondary | ICD-10-CM | POA: Diagnosis not present

## 2019-03-20 NOTE — Telephone Encounter (Signed)
Do you happen to have the phone number to return call for [facility]?

## 2019-03-20 NOTE — Telephone Encounter (Signed)
LVM letting daughter know. 

## 2019-03-20 NOTE — Telephone Encounter (Deleted)
error 

## 2019-03-20 NOTE — Telephone Encounter (Signed)
   Daughter calling to report her mother is complaining of vaginal itching and itching on her buttocks as well. No visible rash No discharge They have been using OTC cream. Declined an appointment    Please call daughter [name]

## 2019-03-20 NOTE — Telephone Encounter (Signed)
New message   [facility]   Need verbal orders   PT 1 x week for  1 week  O x time for 2 week  One time a week for one week

## 2019-03-20 NOTE — Patient Instructions (Signed)
Pre visit review using our clinic review tool, if applicable. No additional management support is needed unless otherwise documented below in the visit note.  Per Dr. Quay Burow, take 6 mg Friday and Saturday and then resume current dosage and re-check in 1 week.  Patient normally takes 1/2 tablet daily except 1 tablet on Mon Wed and Fridays. Dosing instructions given to  [name], RN @ [facility] - [phone].

## 2019-03-20 NOTE — Telephone Encounter (Signed)
We can try a topical prescription antifungal cream or try Diflucan, which is the pill.

## 2019-03-21 ENCOUNTER — Encounter: Payer: Self-pay | Admitting: Internal Medicine

## 2019-03-21 MED ORDER — FLUCONAZOLE 150 MG PO TABS: 150.0000 mg | ORAL_TABLET | Freq: Once | ORAL | 0 refills | Status: AC

## 2019-03-21 NOTE — Telephone Encounter (Signed)
Gave ok for orders.  

## 2019-03-21 NOTE — Telephone Encounter (Signed)
Addendum  [facility] phone # [phone]

## 2019-03-22 ENCOUNTER — Ambulatory Visit (INDEPENDENT_AMBULATORY_CARE_PROVIDER_SITE_OTHER): Payer: Medicare Other | Admitting: General Practice

## 2019-03-22 DIAGNOSIS — Z86711 Personal history of pulmonary embolism: Secondary | ICD-10-CM | POA: Diagnosis not present

## 2019-03-22 DIAGNOSIS — I69320 Aphasia following cerebral infarction: Secondary | ICD-10-CM | POA: Diagnosis not present

## 2019-03-22 DIAGNOSIS — L89153 Pressure ulcer of sacral region, stage 3: Secondary | ICD-10-CM | POA: Diagnosis not present

## 2019-03-22 DIAGNOSIS — I69351 Hemiplegia and hemiparesis following cerebral infarction affecting right dominant side: Secondary | ICD-10-CM | POA: Diagnosis not present

## 2019-03-22 DIAGNOSIS — I972 Postmastectomy lymphedema syndrome: Secondary | ICD-10-CM | POA: Diagnosis not present

## 2019-03-22 DIAGNOSIS — L89323 Pressure ulcer of left buttock, stage 3: Secondary | ICD-10-CM | POA: Diagnosis not present

## 2019-03-22 DIAGNOSIS — K589 Irritable bowel syndrome without diarrhea: Secondary | ICD-10-CM

## 2019-03-22 DIAGNOSIS — Z7901 Long term (current) use of anticoagulants: Secondary | ICD-10-CM | POA: Diagnosis not present

## 2019-03-22 DIAGNOSIS — D473 Essential (hemorrhagic) thrombocythemia: Secondary | ICD-10-CM | POA: Diagnosis not present

## 2019-03-22 DIAGNOSIS — M150 Primary generalized (osteo)arthritis: Secondary | ICD-10-CM

## 2019-03-22 DIAGNOSIS — Z5181 Encounter for therapeutic drug level monitoring: Secondary | ICD-10-CM

## 2019-03-22 LAB — POCT INR: INR: 2.3 (ref 2.0–3.0)

## 2019-03-22 NOTE — Patient Instructions (Signed)
Pre visit review using our clinic review tool, if applicable. No additional management support is needed unless otherwise documented below in the visit note. Change dosage and take 1 tablet daily and 1/2 tablet on tuesdays and Saturdays.  Re-check in 2 weeks.  Dosing instructions given to  [name], RN @ [facility] - [phone].

## 2019-03-22 NOTE — Telephone Encounter (Signed)
Daughter states pt is doing better.

## 2019-03-22 NOTE — Progress Notes (Signed)
I have reviewed the results and agree with this plan   

## 2019-03-27 ENCOUNTER — Telehealth: Payer: Self-pay | Admitting: Internal Medicine

## 2019-03-27 ENCOUNTER — Telehealth: Payer: Medicare Other

## 2019-03-27 DIAGNOSIS — I69351 Hemiplegia and hemiparesis following cerebral infarction affecting right dominant side: Secondary | ICD-10-CM | POA: Diagnosis not present

## 2019-03-27 DIAGNOSIS — I972 Postmastectomy lymphedema syndrome: Secondary | ICD-10-CM | POA: Diagnosis not present

## 2019-03-27 DIAGNOSIS — L89153 Pressure ulcer of sacral region, stage 3: Secondary | ICD-10-CM | POA: Diagnosis not present

## 2019-03-27 DIAGNOSIS — M150 Primary generalized (osteo)arthritis: Secondary | ICD-10-CM | POA: Diagnosis not present

## 2019-03-27 DIAGNOSIS — I69320 Aphasia following cerebral infarction: Secondary | ICD-10-CM | POA: Diagnosis not present

## 2019-03-27 DIAGNOSIS — L89323 Pressure ulcer of left buttock, stage 3: Secondary | ICD-10-CM | POA: Diagnosis not present

## 2019-03-27 NOTE — Telephone Encounter (Signed)
Requesting verbal order for: Increase PT to 1 x 5  [phone] - [facility] - Cherie Dark to LVM

## 2019-03-27 NOTE — Telephone Encounter (Signed)
Gave ok for orders.  

## 2019-03-28 DIAGNOSIS — L89153 Pressure ulcer of sacral region, stage 3: Secondary | ICD-10-CM | POA: Diagnosis not present

## 2019-03-28 DIAGNOSIS — I69351 Hemiplegia and hemiparesis following cerebral infarction affecting right dominant side: Secondary | ICD-10-CM | POA: Diagnosis not present

## 2019-03-28 DIAGNOSIS — M150 Primary generalized (osteo)arthritis: Secondary | ICD-10-CM | POA: Diagnosis not present

## 2019-03-28 DIAGNOSIS — I972 Postmastectomy lymphedema syndrome: Secondary | ICD-10-CM | POA: Diagnosis not present

## 2019-03-28 DIAGNOSIS — I69320 Aphasia following cerebral infarction: Secondary | ICD-10-CM | POA: Diagnosis not present

## 2019-03-28 DIAGNOSIS — L89323 Pressure ulcer of left buttock, stage 3: Secondary | ICD-10-CM | POA: Diagnosis not present

## 2019-03-29 ENCOUNTER — Other Ambulatory Visit: Payer: Self-pay | Admitting: Interventional Radiology

## 2019-03-29 ENCOUNTER — Other Ambulatory Visit (HOSPITAL_COMMUNITY): Payer: Self-pay | Admitting: Interventional Radiology

## 2019-03-29 DIAGNOSIS — E0520 Thyrotoxicosis with toxic multinodular goiter without thyrotoxic crisis or storm: Secondary | ICD-10-CM

## 2019-04-04 DIAGNOSIS — E042 Nontoxic multinodular goiter: Secondary | ICD-10-CM | POA: Diagnosis not present

## 2019-04-04 DIAGNOSIS — R5381 Other malaise: Secondary | ICD-10-CM | POA: Diagnosis not present

## 2019-04-04 DIAGNOSIS — L89323 Pressure ulcer of left buttock, stage 3: Secondary | ICD-10-CM | POA: Diagnosis not present

## 2019-04-04 DIAGNOSIS — L89154 Pressure ulcer of sacral region, stage 4: Secondary | ICD-10-CM | POA: Diagnosis not present

## 2019-04-04 DIAGNOSIS — I69359 Hemiplegia and hemiparesis following cerebral infarction affecting unspecified side: Secondary | ICD-10-CM | POA: Diagnosis not present

## 2019-04-06 ENCOUNTER — Ambulatory Visit (INDEPENDENT_AMBULATORY_CARE_PROVIDER_SITE_OTHER): Payer: Medicare Other | Admitting: General Practice

## 2019-04-06 DIAGNOSIS — Z7901 Long term (current) use of anticoagulants: Secondary | ICD-10-CM

## 2019-04-06 DIAGNOSIS — I69351 Hemiplegia and hemiparesis following cerebral infarction affecting right dominant side: Secondary | ICD-10-CM | POA: Diagnosis not present

## 2019-04-06 DIAGNOSIS — L89323 Pressure ulcer of left buttock, stage 3: Secondary | ICD-10-CM | POA: Diagnosis not present

## 2019-04-06 DIAGNOSIS — I972 Postmastectomy lymphedema syndrome: Secondary | ICD-10-CM | POA: Diagnosis not present

## 2019-04-06 DIAGNOSIS — M150 Primary generalized (osteo)arthritis: Secondary | ICD-10-CM | POA: Diagnosis not present

## 2019-04-06 DIAGNOSIS — I69320 Aphasia following cerebral infarction: Secondary | ICD-10-CM | POA: Diagnosis not present

## 2019-04-06 DIAGNOSIS — L89153 Pressure ulcer of sacral region, stage 3: Secondary | ICD-10-CM | POA: Diagnosis not present

## 2019-04-06 LAB — POCT INR: INR: 3 (ref 2.0–3.0)

## 2019-04-06 NOTE — Progress Notes (Signed)
Medical screening examination/treatment/procedure(s) were performed by non-physician practitioner and as supervising physician I was immediately available for consultation/collaboration. I agree with above. Mickell Birdwell, MD   

## 2019-04-06 NOTE — Patient Instructions (Signed)
Pre visit review using our clinic review tool, if applicable. No additional management support is needed unless otherwise documented below in the visit note.  Continue to take 1 tablet daily and 1/2 tablet on tuesdays and Saturdays.  Re-check in 2 weeks.  Dosing instructions given to  Ria Comment, [place] @ [facility] [phone]

## 2019-04-07 DIAGNOSIS — M150 Primary generalized (osteo)arthritis: Secondary | ICD-10-CM | POA: Diagnosis not present

## 2019-04-07 DIAGNOSIS — L89323 Pressure ulcer of left buttock, stage 3: Secondary | ICD-10-CM | POA: Diagnosis not present

## 2019-04-07 DIAGNOSIS — I69351 Hemiplegia and hemiparesis following cerebral infarction affecting right dominant side: Secondary | ICD-10-CM | POA: Diagnosis not present

## 2019-04-07 DIAGNOSIS — L89153 Pressure ulcer of sacral region, stage 3: Secondary | ICD-10-CM | POA: Diagnosis not present

## 2019-04-07 DIAGNOSIS — I972 Postmastectomy lymphedema syndrome: Secondary | ICD-10-CM | POA: Diagnosis not present

## 2019-04-07 DIAGNOSIS — I69320 Aphasia following cerebral infarction: Secondary | ICD-10-CM | POA: Diagnosis not present

## 2019-04-10 DIAGNOSIS — I972 Postmastectomy lymphedema syndrome: Secondary | ICD-10-CM | POA: Diagnosis not present

## 2019-04-10 DIAGNOSIS — M150 Primary generalized (osteo)arthritis: Secondary | ICD-10-CM | POA: Diagnosis not present

## 2019-04-10 DIAGNOSIS — I69320 Aphasia following cerebral infarction: Secondary | ICD-10-CM | POA: Diagnosis not present

## 2019-04-10 DIAGNOSIS — L89323 Pressure ulcer of left buttock, stage 3: Secondary | ICD-10-CM | POA: Diagnosis not present

## 2019-04-10 DIAGNOSIS — L89153 Pressure ulcer of sacral region, stage 3: Secondary | ICD-10-CM | POA: Diagnosis not present

## 2019-04-10 DIAGNOSIS — I69351 Hemiplegia and hemiparesis following cerebral infarction affecting right dominant side: Secondary | ICD-10-CM | POA: Diagnosis not present

## 2019-04-11 ENCOUNTER — Other Ambulatory Visit: Payer: Self-pay

## 2019-04-11 ENCOUNTER — Inpatient Hospital Stay: Payer: Medicare Other

## 2019-04-11 ENCOUNTER — Encounter: Payer: Self-pay | Admitting: Hematology and Oncology

## 2019-04-11 ENCOUNTER — Other Ambulatory Visit: Payer: Self-pay | Admitting: Hematology and Oncology

## 2019-04-11 ENCOUNTER — Inpatient Hospital Stay: Payer: Medicare Other | Attending: Hematology and Oncology | Admitting: Hematology and Oncology

## 2019-04-11 ENCOUNTER — Ambulatory Visit (HOSPITAL_COMMUNITY)
Admission: RE | Admit: 2019-04-11 | Discharge: 2019-04-11 | Disposition: A | Discharge status: Home / Self Care | Payer: Medicare Other | Source: Ambulatory Visit | Attending: Interventional Radiology | Admitting: Interventional Radiology

## 2019-04-11 ENCOUNTER — Encounter (HOSPITAL_COMMUNITY): Payer: Self-pay

## 2019-04-11 DIAGNOSIS — E0590 Thyrotoxicosis, unspecified without thyrotoxic crisis or storm: Secondary | ICD-10-CM

## 2019-04-11 DIAGNOSIS — Z86711 Personal history of pulmonary embolism: Secondary | ICD-10-CM | POA: Diagnosis not present

## 2019-04-11 DIAGNOSIS — E049 Nontoxic goiter, unspecified: Secondary | ICD-10-CM | POA: Diagnosis not present

## 2019-04-11 DIAGNOSIS — E042 Nontoxic multinodular goiter: Secondary | ICD-10-CM | POA: Insufficient documentation

## 2019-04-11 DIAGNOSIS — Z7901 Long term (current) use of anticoagulants: Secondary | ICD-10-CM | POA: Insufficient documentation

## 2019-04-11 DIAGNOSIS — D473 Essential (hemorrhagic) thrombocythemia: Secondary | ICD-10-CM

## 2019-04-11 DIAGNOSIS — E0520 Thyrotoxicosis with toxic multinodular goiter without thyrotoxic crisis or storm: Secondary | ICD-10-CM | POA: Diagnosis not present

## 2019-04-11 DIAGNOSIS — I6522 Occlusion and stenosis of left carotid artery: Secondary | ICD-10-CM | POA: Diagnosis not present

## 2019-04-11 LAB — CMP (CANCER CENTER ONLY)
ALT: 7 U/L (ref 0–44)
AST: 15 U/L (ref 15–41)
Albumin: 3 g/dL — ABNORMAL LOW (ref 3.5–5.0)
Alkaline Phosphatase: 91 U/L (ref 38–126)
Anion gap: 7 (ref 5–15)
BUN: 21 mg/dL (ref 8–23)
CO2: 27 mmol/L (ref 22–32)
Calcium: 8.8 mg/dL — ABNORMAL LOW (ref 8.9–10.3)
Chloride: 107 mmol/L (ref 98–111)
Creatinine: 0.54 mg/dL (ref 0.44–1.00)
GFR, Est AFR Am: >60 mL/min (ref >60)
GFR, Estimated: >60 mL/min (ref >60)
Glucose, Bld: 102 mg/dL — ABNORMAL HIGH (ref 70–99)
Potassium: 3.8 mmol/L (ref 3.5–5.1)
Sodium: 141 mmol/L (ref 135–145)
Total Bilirubin: 0.4 mg/dL (ref 0.3–1.2)
Total Protein: 7.3 g/dL (ref 6.5–8.1)

## 2019-04-11 LAB — CBC WITH DIFFERENTIAL/PLATELET
Abs Immature Granulocytes: 0.09 10*3/uL — ABNORMAL HIGH (ref 0.00–0.07)
Basophils Absolute: 0.1 10*3/uL (ref 0.0–0.1)
Basophils Relative: 1 %
Eosinophils Absolute: 0.3 10*3/uL (ref 0.0–0.5)
Eosinophils Relative: 2 %
HCT: 38.3 % (ref 36.0–46.0)
Hemoglobin: 11.7 g/dL — ABNORMAL LOW (ref 12.0–15.0)
Immature Granulocytes: 1 %
Lymphocytes Relative: 8 %
Lymphs Abs: 1.1 10*3/uL (ref 0.7–4.0)
MCH: 33.6 pg (ref 26.0–34.0)
MCHC: 30.5 g/dL (ref 30.0–36.0)
MCV: 110.1 fL — ABNORMAL HIGH (ref 80.0–100.0)
Monocytes Absolute: 0.5 10*3/uL (ref 0.1–1.0)
Monocytes Relative: 4 %
Neutro Abs: 12.6 10*3/uL — ABNORMAL HIGH (ref 1.7–7.7)
Neutrophils Relative %: 84 %
Platelets: 716 10*3/uL — ABNORMAL HIGH (ref 150–400)
RBC: 3.48 MIL/uL — ABNORMAL LOW (ref 3.87–5.11)
RDW: 17.4 % — ABNORMAL HIGH (ref 11.5–15.5)
WBC: 14.7 10*3/uL — ABNORMAL HIGH (ref 4.0–10.5)
nRBC: 0 % (ref 0.0–0.2)

## 2019-04-11 LAB — T4, FREE: Free T4: 0.98 ng/dL (ref 0.61–1.12)

## 2019-04-11 MED ORDER — SODIUM CHLORIDE (PF) 0.9 % IJ SOLN
INTRAMUSCULAR | Status: AC
  Filled 2019-04-11: qty 50

## 2019-04-11 MED ORDER — IOHEXOL 350 MG/ML SOLN
100.0000 mL | Freq: Once | INTRAVENOUS | Status: AC | PRN
  Administered 2019-04-11: 17:00:00 100 mL via INTRAVENOUS

## 2019-04-11 NOTE — Progress Notes (Signed)
[place] OFFICE PROGRESS NOTE  Patient Care Team: [name], MD as PCP - General (Internal Medicine) Eunice Blase, MD as Consulting Physician (Family Medicine) Renato Shin, MD as Consulting Physician (Endocrinology) Netta Cedars, MD as Consulting Physician (Orthopedic Surgery) Lyndal Pulley, DO (Sports Medicine) Garvin Fila, MD (Neurology)  ASSESSMENT & PLAN:  Essential thrombocytosis [facility]) Her CBC showed elevated WBC and platelets, likely due to reactive process from her decubitus ulcer I do not plan to change her Hydrea dosing as higher dose may cause more pancytopenia and may impair wound healing For now, she will continue same dose of Hydrea as prescribed I plan to recheck her lab work again in 6 months  History of pulmonary embolism Due to her high risk disease, she will continue anticoagulation therapy indefinitely She will continue INR monitoring through her primary care doctor. Yesterday, her INR is stable and she has no bleeding complications  Multinodular goiter Per daughter request, T3 and free T4 levels were drawn and will forward the results to her physician   No orders of the defined types were placed in this encounter.   All questions were answered. The patient knows to call the clinic with any problems, questions or concerns. The total time spent in the appointment was 15 minutes encounter with patients including review of chart and various tests results, discussions about plan of care and coordination of care plan   Heath Lark, MD 04/11/2019 4:02 PM  INTERVAL HISTORY: Please see below for problem oriented charting. She returns with her daughter for further follow-up The patient has chronic dysarthria secondary to stroke Majority of history is obtained through her daughter Patient continues to have at least stage III decubitus ulcer for the last 6 months She is compliant taking hydroxyurea as directed The patient denies any  recent signs or symptoms of bleeding such as spontaneous epistaxis, hematuria or hematochezia.  SUMMARY OF ONCOLOGIC HISTORY: Oncology History  Essential thrombocytosis ([place])  08/07/2013 Miscellaneous   The patient is noted to have elevated platelet count   03/08/2014 Imaging   Positive for acute PE with CT evidence of right heart strain (RV/LV Ratio = 0.9) consistent with at least submassive (intermediate risk)PE.    03/09/2014 Imaging   US venous Doppler showed deep vein thrombosis noted in the right distal common femoral vein, femoral vein, and popliteal vein. DVT noted in the left femoral and popliteal veins   03/09/2014 Imaging   Patchy areas of acute left MCA territory infarction. 2. Several punctate foci of acute right MCA territory infarction. Possible trace subarachnoid hemorrhage in the high right frontal lobe. 3. Occluded left ICA and left MCA   03/20/2015 Imaging   Compared to MRI on 03/09/14, there has been expected evolutional change of left MCA infarction. In addition, there may be a few areas of acute-subacute infarcts in the left basal ganglia vs artifact.     07/31/2015 Pathology Results   Peripheral blood is positive for JAK2 mutation   07/31/2015 -  Chemotherapy   She is started on 500 mg daily Hydrea   08/15/2015 Miscellaneous   The dose of hydroxyurea is increased to 1000 mg daily   09/26/2015 Miscellaneous   Dose of Hydrea to 500 mg daily except on Saturdays and Sundays she takes 1000 mg.   10/25/2015 Adverse Reaction   Dose of Hydrea is reduced to 500 mg daily    10/08/2016 Miscellaneous   From 2017 to 2018, the dose of Hydrea is further adjusted.  REVIEW OF SYSTEMS:   Constitutional: Denies fevers, chills or abnormal weight loss Eyes: Denies blurriness of vision Ears, nose, mouth, throat, and face: Denies mucositis or sore throat Respiratory: Denies cough, dyspnea or wheezes Cardiovascular: Denies palpitation, chest discomfort or lower extremity  swelling Gastrointestinal:  Denies nausea, heartburn or change in bowel habits Skin: Denies abnormal skin rashes Lymphatics: Denies new lymphadenopathy or easy bruising Neurological:Denies numbness, tingling or new weaknesses Behavioral/Psych: Mood is stable, no new changes  All other systems were reviewed with the patient and are negative.  I have reviewed the past medical history, past surgical history, social history and family history with the patient and they are unchanged from previous note.  ALLERGIES:  is allergic to valtrex [valacyclovir hcl].  MEDICATIONS:  Current Outpatient Medications  Medication Sig Dispense Refill  . acetaminophen (TYLENOL) 160 MG/5ML liquid Take 480 mg by mouth every 4 (four) hours as needed for fever or pain.    . Calamine-Zinc Oxide LOTN Apply topically.    . ciclopirox (PENLAC) 8 % solution APPLY TOPICALLY AT BEDTIME OVER NAIL & SURROUNDING SKIN,APPLY DAILY OVER PREVIOUS COAT,AFTER 7 DAYS REMOVE WITH ALCOHOL AND CONTINUE CYCLE 6.6 mL 5  . diclofenac Sodium (VOLTAREN) 1 % GEL APPLY 2 G TOPICALLY 3 (THREE) TIMES DAILY AS NEEDED. 300 g 0  . hydroxyurea (HYDREA) 500 MG capsule TAKE 1 TABLET (500 MG) DAILY EXCEPT 2 TABLETS ( 1000 MG ) ON WEDNESDAY, SATURDAY AND SUNDAY 360 capsule 1  . ipratropium-albuterol (DUONEB) 0.5-2.5 (3) MG/3ML SOLN Take 3 mLs by nebulization every 6 (six) hours as needed (shortness of breath). 360 mL 3  . lidocaine (LIDODERM) 5 % Place 1 patch daily onto the skin. Remove & Discard patch within 12 hours or as directed by MD (Patient taking differently: Place 1 patch onto the skin daily as needed (pain). Remove & Discard patch within 12 hours or as directed by MD) 15 patch 0  . methimazole (TAPAZOLE) 5 MG tablet TAKE 1 TABLET 2 TIMES A WEEK 8 tablet 4  . NONFORMULARY OR COMPOUNDED ITEM Topical cream with equal parts diclofenac, gabapentin, lidocaine, menthol  Disp 100 gm 1 each 5  . Oral Hygiene Products (Q-CARE COVERD YANKAUER/SUCTION)  MISC Use as directed 10 each 5  . OXYGEN 2lpm 24/7  DME- [facility]    . pantoprazole sodium (PROTONIX) 40 mg/20 mL PACK Take 20 mLs (40 mg total) by mouth 2 (two) times daily. 80 mL 3  . polyethylene glycol (MIRALAX / GLYCOLAX) packet TAKE 17G BY MOUTH TWICE A DAY (Patient taking differently: Take 17 g by mouth 2 (two) times daily as needed for mild constipation. ) 14 packet 1  . pregabalin (LYRICA) 50 MG capsule Take 1 capsule (50 mg total) by mouth 3 (three) times daily. 90 capsule 0  . Probiotic Product (ALIGN) 4 MG CAPS Take 4 mg by mouth daily.     . propranolol (INDERAL) 10 MG tablet TAKE 0.5 TABLETS (5 MG TOTAL) BY MOUTH 2 (TWO) TIMES DAILY. 60 tablet 2  . traMADol (ULTRAM) 50 MG tablet TAKE 2 TABLETS BY MOUTH 3 TIMES A DAY AS NEEDED 180 tablet 1  . traZODone (DESYREL) 50 MG tablet TAKE 1 TABLET (50 MG TOTAL) BY MOUTH AT BEDTIME AS NEEDED. FOR SLEEP 90 tablet 1  . warfarin (COUMADIN) 4 MG tablet TAKE 1 TABLET DAILY EXCEPT 1/2 ON SUNDAY AND THURSDAYS OR TAKE AS DIRECTED BY ANTICOAGULATION CLINIC. 90 DAY 90 tablet 1   No current facility-administered medications for this visit.  PHYSICAL EXAMINATION: ECOG PERFORMANCE STATUS: 2 - Symptomatic, <50% confined to bed  Vitals:   04/11/19 1530  BP: (!) 112/57  Pulse: 70  Resp: 18  Temp: 98.9 F (37.2 C)  SpO2: 95%   There were no vitals filed for this visit.  GENERAL:alert, no distress and comfortable  LABORATORY DATA:  I have reviewed the data as listed    Component Value Date/Time   NA 141 04/11/2019 1503   NA 142 10/06/2016 1352   K 3.8 04/11/2019 1503   K 4.1 10/06/2016 1352   CL 107 04/11/2019 1503   CO2 27 04/11/2019 1503   CO2 27 10/06/2016 1352   GLUCOSE 102 (H) 04/11/2019 1503   GLUCOSE 90 10/06/2016 1352   BUN 21 04/11/2019 1503   BUN 12.9 10/06/2016 1352   CREATININE 0.54 04/11/2019 1503   CREATININE 0.6 10/06/2016 1352   CALCIUM 8.8 (L) 04/11/2019 1503   CALCIUM 9.4 10/06/2016 1352   PROT 7.3 04/11/2019 1503    ALBUMIN 3.0 (L) 04/11/2019 1503   AST 15 04/11/2019 1503   ALT 7 04/11/2019 1503   ALKPHOS 91 04/11/2019 1503   BILITOT 0.4 04/11/2019 1503   GFRNONAA >60 04/11/2019 1503   GFRAA >60 04/11/2019 1503    No results found for: SPEP, UPEP  Lab Results  Component Value Date   WBC 14.7 (H) 04/11/2019   NEUTROABS 12.6 (H) 04/11/2019   HGB 11.7 (L) 04/11/2019   HCT 38.3 04/11/2019   MCV 110.1 (H) 04/11/2019   PLT 716 (H) 04/11/2019      Chemistry      Component Value Date/Time   NA 141 04/11/2019 1503   NA 142 10/06/2016 1352   K 3.8 04/11/2019 1503   K 4.1 10/06/2016 1352   CL 107 04/11/2019 1503   CO2 27 04/11/2019 1503   CO2 27 10/06/2016 1352   BUN 21 04/11/2019 1503   BUN 12.9 10/06/2016 1352   CREATININE 0.54 04/11/2019 1503   CREATININE 0.6 10/06/2016 1352      Component Value Date/Time   CALCIUM 8.8 (L) 04/11/2019 1503   CALCIUM 9.4 10/06/2016 1352   ALKPHOS 91 04/11/2019 1503   AST 15 04/11/2019 1503   ALT 7 04/11/2019 1503   BILITOT 0.4 04/11/2019 1503

## 2019-04-11 NOTE — Assessment & Plan Note (Signed)
Due to her high risk disease, she will continue anticoagulation therapy indefinitely She will continue INR monitoring through her primary care doctor. Yesterday, her INR is stable and she has no bleeding complications

## 2019-04-11 NOTE — Assessment & Plan Note (Signed)
Her CBC showed elevated WBC and platelets, likely due to reactive process from her decubitus ulcer I do not plan to change her Hydrea dosing as higher dose may cause more pancytopenia and may impair wound healing For now, she will continue same dose of Hydrea as prescribed I plan to recheck her lab work again in 6 months

## 2019-04-11 NOTE — Assessment & Plan Note (Signed)
Per daughter request, T3 and free T4 levels were drawn and will forward the results to her physician

## 2019-04-12 ENCOUNTER — Telehealth: Payer: Self-pay | Admitting: Hematology and Oncology

## 2019-04-12 DIAGNOSIS — L89153 Pressure ulcer of sacral region, stage 3: Secondary | ICD-10-CM | POA: Diagnosis not present

## 2019-04-12 DIAGNOSIS — L89323 Pressure ulcer of left buttock, stage 3: Secondary | ICD-10-CM | POA: Diagnosis not present

## 2019-04-12 DIAGNOSIS — I69320 Aphasia following cerebral infarction: Secondary | ICD-10-CM | POA: Diagnosis not present

## 2019-04-12 DIAGNOSIS — M150 Primary generalized (osteo)arthritis: Secondary | ICD-10-CM | POA: Diagnosis not present

## 2019-04-12 DIAGNOSIS — I972 Postmastectomy lymphedema syndrome: Secondary | ICD-10-CM | POA: Diagnosis not present

## 2019-04-12 DIAGNOSIS — I69351 Hemiplegia and hemiparesis following cerebral infarction affecting right dominant side: Secondary | ICD-10-CM | POA: Diagnosis not present

## 2019-04-12 LAB — T3, FREE: T3, Free: 2.1 pg/mL (ref 2.0–4.4)

## 2019-04-12 NOTE — Telephone Encounter (Signed)
Scheduled per 4/6 sch msg. Called and spoke with pt, confirmed 10/21 appt

## 2019-04-14 DIAGNOSIS — L89323 Pressure ulcer of left buttock, stage 3: Secondary | ICD-10-CM | POA: Diagnosis not present

## 2019-04-14 DIAGNOSIS — I972 Postmastectomy lymphedema syndrome: Secondary | ICD-10-CM | POA: Diagnosis not present

## 2019-04-14 DIAGNOSIS — L89153 Pressure ulcer of sacral region, stage 3: Secondary | ICD-10-CM | POA: Diagnosis not present

## 2019-04-14 DIAGNOSIS — Z86711 Personal history of pulmonary embolism: Secondary | ICD-10-CM | POA: Diagnosis not present

## 2019-04-14 DIAGNOSIS — Z5181 Encounter for therapeutic drug level monitoring: Secondary | ICD-10-CM | POA: Diagnosis not present

## 2019-04-14 DIAGNOSIS — Z7901 Long term (current) use of anticoagulants: Secondary | ICD-10-CM | POA: Diagnosis not present

## 2019-04-14 DIAGNOSIS — D473 Essential (hemorrhagic) thrombocythemia: Secondary | ICD-10-CM | POA: Diagnosis not present

## 2019-04-14 DIAGNOSIS — I69320 Aphasia following cerebral infarction: Secondary | ICD-10-CM | POA: Diagnosis not present

## 2019-04-14 DIAGNOSIS — M150 Primary generalized (osteo)arthritis: Secondary | ICD-10-CM | POA: Diagnosis not present

## 2019-04-14 DIAGNOSIS — I69351 Hemiplegia and hemiparesis following cerebral infarction affecting right dominant side: Secondary | ICD-10-CM | POA: Diagnosis not present

## 2019-04-14 DIAGNOSIS — K589 Irritable bowel syndrome without diarrhea: Secondary | ICD-10-CM | POA: Diagnosis not present

## 2019-04-16 ENCOUNTER — Other Ambulatory Visit: Payer: Self-pay | Admitting: Internal Medicine

## 2019-04-17 DIAGNOSIS — M150 Primary generalized (osteo)arthritis: Secondary | ICD-10-CM | POA: Diagnosis not present

## 2019-04-17 DIAGNOSIS — I972 Postmastectomy lymphedema syndrome: Secondary | ICD-10-CM | POA: Diagnosis not present

## 2019-04-17 DIAGNOSIS — I69320 Aphasia following cerebral infarction: Secondary | ICD-10-CM | POA: Diagnosis not present

## 2019-04-17 DIAGNOSIS — I69351 Hemiplegia and hemiparesis following cerebral infarction affecting right dominant side: Secondary | ICD-10-CM | POA: Diagnosis not present

## 2019-04-17 DIAGNOSIS — L89323 Pressure ulcer of left buttock, stage 3: Secondary | ICD-10-CM | POA: Diagnosis not present

## 2019-04-17 DIAGNOSIS — L89153 Pressure ulcer of sacral region, stage 3: Secondary | ICD-10-CM | POA: Diagnosis not present

## 2019-04-18 ENCOUNTER — Other Ambulatory Visit: Payer: Self-pay | Admitting: Internal Medicine

## 2019-04-18 DIAGNOSIS — E0590 Thyrotoxicosis, unspecified without thyrotoxic crisis or storm: Secondary | ICD-10-CM | POA: Diagnosis not present

## 2019-04-18 DIAGNOSIS — E0520 Thyrotoxicosis with toxic multinodular goiter without thyrotoxic crisis or storm: Secondary | ICD-10-CM | POA: Diagnosis not present

## 2019-04-18 NOTE — Telephone Encounter (Signed)
Last RF 03/21/19 Last OV 02/10/19 Next OV 08/21/19

## 2019-04-18 NOTE — Telephone Encounter (Signed)
Check [place] registry last filled 03/21/2019../lmb  

## 2019-04-19 ENCOUNTER — Other Ambulatory Visit: Payer: Self-pay

## 2019-04-19 ENCOUNTER — Encounter: Payer: Self-pay | Admitting: Podiatry

## 2019-04-19 ENCOUNTER — Ambulatory Visit (INDEPENDENT_AMBULATORY_CARE_PROVIDER_SITE_OTHER): Payer: Medicare Other | Admitting: Podiatry

## 2019-04-19 DIAGNOSIS — D689 Coagulation defect, unspecified: Secondary | ICD-10-CM

## 2019-04-19 DIAGNOSIS — M79675 Pain in left toe(s): Secondary | ICD-10-CM | POA: Diagnosis not present

## 2019-04-19 DIAGNOSIS — B351 Tinea unguium: Secondary | ICD-10-CM | POA: Diagnosis not present

## 2019-04-19 DIAGNOSIS — M79674 Pain in right toe(s): Secondary | ICD-10-CM

## 2019-04-19 NOTE — Progress Notes (Signed)
This patient returns to my office for at risk foot care.  This patient requires this care by a professional since this patient will be at risk due to having venous congestion and coagulation defect.  Patient is taking coumadin. Patient presents to the office today in a wheelchair and with her daughter. This patient is unable to cut nails himself since the patient cannot reach his nails.These nails are painful walking and wearing shoes.  This patient presents for at risk foot care today.  General Appearance  Alert, conversant and in no acute stress.  Vascular  Dorsalis pedis and posterior tibial  pulses are palpable  bilaterally.  Capillary return is within normal limits  bilaterally. Temperature is within normal limits  bilaterally.  Neurologic  Deferred since the patient is unable to communicate.    Nails Thick disfigured discolored nails with subungual debris  from hallux to fifth toes bilaterally. No evidence of bacterial infection or drainage bilaterally.  Orthopedic  No limitations of motion  feet .  No crepitus or effusions noted.  No bony pathology or digital deformities noted.  Skin  normotropic skin with no porokeratosis noted bilaterally.  No signs of infections or ulcers noted.     Onychomycosis  Pain in right toes  Pain in left toes  Consent was obtained for treatment procedures.   Mechanical debridement of nails 1-5  bilaterally performed with a nail nipper.  Filed with dremel without incident.    Return office visit  3 months                    Told patient to return for periodic foot care and evaluation due to potential at risk complications.   Gardiner Barefoot DPM

## 2019-04-20 DIAGNOSIS — M150 Primary generalized (osteo)arthritis: Secondary | ICD-10-CM | POA: Diagnosis not present

## 2019-04-20 DIAGNOSIS — I69351 Hemiplegia and hemiparesis following cerebral infarction affecting right dominant side: Secondary | ICD-10-CM | POA: Diagnosis not present

## 2019-04-20 DIAGNOSIS — L89153 Pressure ulcer of sacral region, stage 3: Secondary | ICD-10-CM | POA: Diagnosis not present

## 2019-04-20 DIAGNOSIS — L89323 Pressure ulcer of left buttock, stage 3: Secondary | ICD-10-CM | POA: Diagnosis not present

## 2019-04-20 DIAGNOSIS — I972 Postmastectomy lymphedema syndrome: Secondary | ICD-10-CM | POA: Diagnosis not present

## 2019-04-20 DIAGNOSIS — I69320 Aphasia following cerebral infarction: Secondary | ICD-10-CM | POA: Diagnosis not present

## 2019-04-21 DIAGNOSIS — I69320 Aphasia following cerebral infarction: Secondary | ICD-10-CM | POA: Diagnosis not present

## 2019-04-21 DIAGNOSIS — I69351 Hemiplegia and hemiparesis following cerebral infarction affecting right dominant side: Secondary | ICD-10-CM | POA: Diagnosis not present

## 2019-04-21 DIAGNOSIS — I972 Postmastectomy lymphedema syndrome: Secondary | ICD-10-CM | POA: Diagnosis not present

## 2019-04-21 DIAGNOSIS — M150 Primary generalized (osteo)arthritis: Secondary | ICD-10-CM | POA: Diagnosis not present

## 2019-04-21 DIAGNOSIS — L89323 Pressure ulcer of left buttock, stage 3: Secondary | ICD-10-CM | POA: Diagnosis not present

## 2019-04-21 DIAGNOSIS — L89153 Pressure ulcer of sacral region, stage 3: Secondary | ICD-10-CM | POA: Diagnosis not present

## 2019-04-24 ENCOUNTER — Telehealth: Payer: Self-pay

## 2019-04-24 ENCOUNTER — Ambulatory Visit (INDEPENDENT_AMBULATORY_CARE_PROVIDER_SITE_OTHER): Payer: Medicare Other

## 2019-04-24 DIAGNOSIS — I972 Postmastectomy lymphedema syndrome: Secondary | ICD-10-CM | POA: Diagnosis not present

## 2019-04-24 DIAGNOSIS — Z7901 Long term (current) use of anticoagulants: Secondary | ICD-10-CM | POA: Diagnosis not present

## 2019-04-24 DIAGNOSIS — M150 Primary generalized (osteo)arthritis: Secondary | ICD-10-CM | POA: Diagnosis not present

## 2019-04-24 DIAGNOSIS — I82403 Acute embolism and thrombosis of unspecified deep veins of lower extremity, bilateral: Secondary | ICD-10-CM

## 2019-04-24 DIAGNOSIS — I69320 Aphasia following cerebral infarction: Secondary | ICD-10-CM | POA: Diagnosis not present

## 2019-04-24 DIAGNOSIS — L89323 Pressure ulcer of left buttock, stage 3: Secondary | ICD-10-CM | POA: Diagnosis not present

## 2019-04-24 DIAGNOSIS — I69351 Hemiplegia and hemiparesis following cerebral infarction affecting right dominant side: Secondary | ICD-10-CM | POA: Diagnosis not present

## 2019-04-24 DIAGNOSIS — L89153 Pressure ulcer of sacral region, stage 3: Secondary | ICD-10-CM | POA: Diagnosis not present

## 2019-04-24 LAB — POCT INR: INR: 2.9 (ref 2.0–3.0)

## 2019-04-24 NOTE — Telephone Encounter (Signed)
Contacted [name] and advised no changes in dosing. Recheck in 2 wks. [name] reported she will f/u with pt concerning instructions. [name] verbalized understanding.

## 2019-04-24 NOTE — Telephone Encounter (Signed)
New message   [facility] calling to report PT/INR results  PT : 2.9 INR: 18.9

## 2019-04-24 NOTE — Patient Instructions (Addendum)
Pre visit review using our clinic review tool, if applicable. No additional management support is needed unless otherwise documented below in the visit note.  Continue to take 1 tablet daily and 1/2 tablet on tuesdays and Saturdays.  Re-check in 2 weeks.  Dosing instructions given to  [name], [place] @ [facility] [phone]

## 2019-04-26 DIAGNOSIS — I69320 Aphasia following cerebral infarction: Secondary | ICD-10-CM | POA: Diagnosis not present

## 2019-04-26 DIAGNOSIS — L89153 Pressure ulcer of sacral region, stage 3: Secondary | ICD-10-CM | POA: Diagnosis not present

## 2019-04-26 DIAGNOSIS — I972 Postmastectomy lymphedema syndrome: Secondary | ICD-10-CM | POA: Diagnosis not present

## 2019-04-26 DIAGNOSIS — M150 Primary generalized (osteo)arthritis: Secondary | ICD-10-CM | POA: Diagnosis not present

## 2019-04-26 DIAGNOSIS — L89323 Pressure ulcer of left buttock, stage 3: Secondary | ICD-10-CM | POA: Diagnosis not present

## 2019-04-26 DIAGNOSIS — N3942 Incontinence without sensory awareness: Secondary | ICD-10-CM | POA: Insufficient documentation

## 2019-04-26 DIAGNOSIS — I69351 Hemiplegia and hemiparesis following cerebral infarction affecting right dominant side: Secondary | ICD-10-CM | POA: Diagnosis not present

## 2019-04-26 NOTE — Progress Notes (Signed)
Agree. Thanks

## 2019-04-26 NOTE — Assessment & Plan Note (Signed)
Chronic, lifelong Due to stroke she is bedbound or wheelchair bound Needs two people to change her  High risk of skin issues related to incontinence and has pressure ulcers Requires purewick system to help prevent skin issues and help pressure ulcers heal - this is medically necessary

## 2019-04-27 ENCOUNTER — Telehealth: Payer: Self-pay

## 2019-04-27 DIAGNOSIS — I69351 Hemiplegia and hemiparesis following cerebral infarction affecting right dominant side: Secondary | ICD-10-CM | POA: Diagnosis not present

## 2019-04-27 DIAGNOSIS — I69320 Aphasia following cerebral infarction: Secondary | ICD-10-CM | POA: Diagnosis not present

## 2019-04-27 DIAGNOSIS — M150 Primary generalized (osteo)arthritis: Secondary | ICD-10-CM | POA: Diagnosis not present

## 2019-04-27 DIAGNOSIS — L89323 Pressure ulcer of left buttock, stage 3: Secondary | ICD-10-CM | POA: Diagnosis not present

## 2019-04-27 DIAGNOSIS — L89153 Pressure ulcer of sacral region, stage 3: Secondary | ICD-10-CM | POA: Diagnosis not present

## 2019-04-27 DIAGNOSIS — I972 Postmastectomy lymphedema syndrome: Secondary | ICD-10-CM | POA: Diagnosis not present

## 2019-04-27 NOTE — Telephone Encounter (Signed)
New message    [facility] home [facility] calling needs verbal orders   1. Speech therapy one time a week for three weeks   2. Recommending the patient/daughter to see a dietician on what type of food they should be eating   3. Referral to see a dietician open suggestion

## 2019-04-28 ENCOUNTER — Telehealth: Payer: Self-pay | Admitting: Critical Care Medicine

## 2019-04-28 DIAGNOSIS — I69351 Hemiplegia and hemiparesis following cerebral infarction affecting right dominant side: Secondary | ICD-10-CM | POA: Diagnosis not present

## 2019-04-28 DIAGNOSIS — I69320 Aphasia following cerebral infarction: Secondary | ICD-10-CM | POA: Diagnosis not present

## 2019-04-28 DIAGNOSIS — I972 Postmastectomy lymphedema syndrome: Secondary | ICD-10-CM | POA: Diagnosis not present

## 2019-04-28 DIAGNOSIS — M150 Primary generalized (osteo)arthritis: Secondary | ICD-10-CM | POA: Diagnosis not present

## 2019-04-28 DIAGNOSIS — L89153 Pressure ulcer of sacral region, stage 3: Secondary | ICD-10-CM | POA: Diagnosis not present

## 2019-04-28 DIAGNOSIS — L89323 Pressure ulcer of left buttock, stage 3: Secondary | ICD-10-CM | POA: Diagnosis not present

## 2019-04-28 NOTE — Telephone Encounter (Signed)
Spoke with patient's daughter [name]. She stated that she was attempting to get her mother out of bed yesterday afternoon with the hoyer lift and she had a SOB episode. The patient began to gasp for air despite being on oxygen. She was able to put the patient back into bed and calm her down. She did a breathing treatment and her breathing returned to normal. She said this lasted for about a hour.   Today she is doing much better. She has not had any issues with breathing today. However, she is having issues with swallowing. These swallowing episodes seem to happen a few times each month. She has had numerous swallow studies and was advised that she is has an enlarged thyroid.   Patient is not in any distress. She just wanted to know if Dr. Carlis Abbott had any recommendations for her.   Dr. Carlis Abbott, please advise. Thanks!

## 2019-04-28 NOTE — Telephone Encounter (Signed)
LVM with ok for orders.

## 2019-04-30 NOTE — Telephone Encounter (Signed)
I am sorry that this happened. I think the swallowing could potentially be due to spasm issues if they only happen very infrequently. I am not sure that there will be a good solution to this. Do these episodes happen when she is also short of breath or separately?  Thanks Cherina!  LPC

## 2019-05-01 DIAGNOSIS — L89323 Pressure ulcer of left buttock, stage 3: Secondary | ICD-10-CM | POA: Diagnosis not present

## 2019-05-01 DIAGNOSIS — I69351 Hemiplegia and hemiparesis following cerebral infarction affecting right dominant side: Secondary | ICD-10-CM | POA: Diagnosis not present

## 2019-05-01 DIAGNOSIS — L89153 Pressure ulcer of sacral region, stage 3: Secondary | ICD-10-CM | POA: Diagnosis not present

## 2019-05-01 DIAGNOSIS — I972 Postmastectomy lymphedema syndrome: Secondary | ICD-10-CM | POA: Diagnosis not present

## 2019-05-01 DIAGNOSIS — M150 Primary generalized (osteo)arthritis: Secondary | ICD-10-CM | POA: Diagnosis not present

## 2019-05-01 DIAGNOSIS — I69320 Aphasia following cerebral infarction: Secondary | ICD-10-CM | POA: Diagnosis not present

## 2019-05-01 NOTE — Telephone Encounter (Signed)
Patient's daughter [name] returned call.  Read to her Dr Ainsley Spinner reponse and follow up question.  [name] stated that the swallowing episodes do not happen when she is short of breath, but more afterward >> patient will choke with meals, followed by coughing which leads to the shortness of breath.  [name] stated that she noted something 'new' over the weekend: patient is now having shortness of breath when [name] is changing her and rolling her from side to side.  This is new x1 week.  The dyspnea does stop once [name] is done rolling her.  Again, patient is not in any distress.  Routing back to Dr Carlis Abbott with this follow up information.

## 2019-05-01 NOTE — Telephone Encounter (Signed)
LMOM TCB x1 for Sandy Barrett

## 2019-05-01 NOTE — Telephone Encounter (Signed)
LMTCB x1 for pt's daughter, [name].

## 2019-05-01 NOTE — Telephone Encounter (Signed)
I think this is probably due to when she is getting-year-old her not having good lung mechanics.  This is probably just due to overall weakness.  But she is really worried about this we can discuss it in the office. My guess is that her shortness of breath after choking episodes is because she is recovering and also may have things down in her lungs that do make it harder for her to breathe correctly.  This is one of the complicated parts about aspiration.  Unfortunately as I had said previously there is really not an easy fix for this problem. We can only treat the symptoms. Thanks Janett Billow!  LPC

## 2019-05-01 NOTE — Telephone Encounter (Signed)
Sandy Barrett daughter is returning phone call. Sandy Barrett phone number is [phone].

## 2019-05-02 ENCOUNTER — Ambulatory Visit: Payer: Medicare Other

## 2019-05-02 NOTE — Telephone Encounter (Signed)
ATC daughter [name] phone number is [phone] [facility]

## 2019-05-03 ENCOUNTER — Telehealth: Payer: Self-pay | Admitting: Critical Care Medicine

## 2019-05-03 DIAGNOSIS — I972 Postmastectomy lymphedema syndrome: Secondary | ICD-10-CM | POA: Diagnosis not present

## 2019-05-03 DIAGNOSIS — L89323 Pressure ulcer of left buttock, stage 3: Secondary | ICD-10-CM | POA: Diagnosis not present

## 2019-05-03 DIAGNOSIS — M150 Primary generalized (osteo)arthritis: Secondary | ICD-10-CM | POA: Diagnosis not present

## 2019-05-03 DIAGNOSIS — I69351 Hemiplegia and hemiparesis following cerebral infarction affecting right dominant side: Secondary | ICD-10-CM | POA: Diagnosis not present

## 2019-05-03 DIAGNOSIS — L89153 Pressure ulcer of sacral region, stage 3: Secondary | ICD-10-CM | POA: Diagnosis not present

## 2019-05-03 DIAGNOSIS — I69320 Aphasia following cerebral infarction: Secondary | ICD-10-CM | POA: Diagnosis not present

## 2019-05-03 NOTE — Telephone Encounter (Signed)
May 01, 2019 Sandy Hy, DO to Rinaldo Ratel, CMA     4:15 PM Note   I think this is probably due to when she is getting-year-old her not having good lung mechanics.  This is probably just due to overall weakness.  But she is really worried about this we can discuss it in the office. My guess is that her shortness of breath after choking episodes is because she is recovering and also may have things down in her lungs that do make it harder for her to breathe correctly.  This is one of the complicated parts about aspiration.  Unfortunately as I had said previously there is really not an easy fix for this problem.  We can only treat the symptoms. Thanks Janett Billow!  LPC   I called and spoke with the pt's daughter, [name] and notified of recs per Dr Carlis Abbott. She verbalized understanding and will keep appt 05/15/19

## 2019-05-03 NOTE — Telephone Encounter (Signed)
ATC [name] looks like this is the 3rd attempt per protocol I will close this encounter. LMTCB

## 2019-05-04 ENCOUNTER — Ambulatory Visit (INDEPENDENT_AMBULATORY_CARE_PROVIDER_SITE_OTHER): Payer: Medicare Other

## 2019-05-04 DIAGNOSIS — L89153 Pressure ulcer of sacral region, stage 3: Secondary | ICD-10-CM | POA: Diagnosis not present

## 2019-05-04 DIAGNOSIS — Z0000 Encounter for general adult medical examination without abnormal findings: Secondary | ICD-10-CM | POA: Diagnosis not present

## 2019-05-04 DIAGNOSIS — I69320 Aphasia following cerebral infarction: Secondary | ICD-10-CM | POA: Diagnosis not present

## 2019-05-04 DIAGNOSIS — I972 Postmastectomy lymphedema syndrome: Secondary | ICD-10-CM | POA: Diagnosis not present

## 2019-05-04 DIAGNOSIS — L89323 Pressure ulcer of left buttock, stage 3: Secondary | ICD-10-CM | POA: Diagnosis not present

## 2019-05-04 DIAGNOSIS — M150 Primary generalized (osteo)arthritis: Secondary | ICD-10-CM | POA: Diagnosis not present

## 2019-05-04 DIAGNOSIS — I69351 Hemiplegia and hemiparesis following cerebral infarction affecting right dominant side: Secondary | ICD-10-CM | POA: Diagnosis not present

## 2019-05-04 NOTE — Progress Notes (Signed)
Subjective:   Sandy Barrett is a 86 y.o. female who presents for Medicare Annual (Subsequent) preventive examination.  Review of Systems:  NO ROS Medicare Wellness Visit Cardiac Risk Factors include: advanced age (>48men, >29 women);Other (see comment)(history of CVA)  Sleep Patterns: No issues with sleep. Home Safety/Smoke Alarms: Feels safe in home; Smoke alarms in place. Living environment: Lives in a 1-story home with her daughter.    Objective:    This visit is being conducted via phone call due to the COVID-19 pandemic. This patient has given me verbal consent via phone to conduct this visit, patient states they are participating from their home address. Some vital signs may be absent or patient reported.   Patient identification: identified by name, DOB, and current address.  Location provider: [employer], Office Persons participating in the virtual visit: Cathlean Cower, MD, Lisette Abu, LPN, [name] (daughter, per [facility])    Vitals: There were no vitals taken for this visit.  There is no height or weight on file to calculate BMI.  Advanced Directives 05/04/2019 02/13/2017 02/12/2017 02/08/2017 02/07/2017 10/30/2016 10/02/2016  Does Patient Have a Medical Advance Directive? No - No No No No No  Would patient like information on creating a medical advance directive? No - Patient declined No - Patient declined - No - Patient declined - - -    Tobacco Social History   Tobacco Use  Smoking Status Never Smoker  Smokeless Tobacco Never Used     Counseling given: Not Answered   Clinical Intake:  Pre-visit preparation completed: Yes  Pain : No/denies pain(per Daughter, [name])     Diabetes: No     Interpreter Needed?: No  Information entered by :: Nyazia Canevari N. Lowell Guitar, LPN  Past Medical History:  Diagnosis Date  . ARTHRITIS   . Arthritis   . Diverticulosis   . Essential thrombocytosis ([place])   . HYPERTENSION   . HYPERTHYROIDISM   . Hyperthyroidism    s/p I-131  ablation 03/2011 of multinod goiter  . INSOMNIA   . MIGRAINE HEADACHE   . OBESITY   . Posttraumatic stress disorder   . Pulmonary embolism ([place]) 03/2014   with DVT  . Stroke [facility]) 03/2014   dysarthria   Past Surgical History:  Procedure Laterality Date  . ABDOMINAL HYSTERECTOMY  1976  . BREAST SURGERY     biopsy  . RADIOLOGY WITH ANESTHESIA Left 03/08/2014   Procedure: RADIOLOGY WITH ANESTHESIA;  Surgeon: Rob Hickman, MD;  Location: [place];  Service: Radiology;  Laterality: Left;  . TONSILLECTOMY    . TOTAL HIP ARTHROPLASTY  1998    right   Family History  Problem Relation Age of Onset  . Asthma Mother   . Asthma Father   . Prostate cancer Father   . Stroke Brother   . Breast cancer Sister   . Stomach cancer Sister   . Thyroid disease Neg Hx    Social History   Socioeconomic History  . Marital status: Widowed    Spouse name: Not on file  . Number of children: 2  . Years of education: 77  . Highest education level: Not on file  Occupational History  . Occupation: retired    Comment: Restaurant manager, fast food  Tobacco Use  . Smoking status: Never Smoker  . Smokeless tobacco: Never Used  Substance and Sexual Activity  . Alcohol use: No    Alcohol/week: 0.0 standard drinks  . Drug use: No  . Sexual activity: Never  Other Topics Concern  .  Not on file  Social History Narrative   Widowed, lived alone prior to CVA 03/2014   SNF at [facility], then home 07/2014 with dtr   Right handed   Caffeine use- occasionally drinks tea   Social Determinants of Health   Financial Resource Strain:   . Difficulty of Paying Living Expenses:   Food Insecurity:   . Worried About Charity fundraiser in the Last Year:   . Arboriculturist in the Last Year:   Transportation Needs:   . Film/video editor (Medical):   [name] Lack of Transportation (Non-Medical):   Physical Activity:   . Days of Exercise per Week:   . Minutes of Exercise per Session:   Stress:   . Feeling of  Stress :   Social Connections:   . Frequency of Communication with Friends and Family:   . Frequency of Social Gatherings with Friends and Family:   . Attends Religious Services:   . Active Member of Clubs or Organizations:   . Attends Archivist Meetings:   [name] Marital Status:     Outpatient Encounter Medications as of 05/04/2019  Medication Sig  . acetaminophen (TYLENOL) 160 MG/5ML liquid Take 480 mg by mouth every 4 (four) hours as needed for fever or pain.  . Calamine-Zinc Oxide LOTN Apply topically.  . ciclopirox (PENLAC) 8 % solution APPLY TOPICALLY AT BEDTIME OVER NAIL & SURROUNDING SKIN,APPLY DAILY OVER PREVIOUS COAT,AFTER 7 DAYS REMOVE WITH ALCOHOL AND CONTINUE CYCLE  . diclofenac Sodium (VOLTAREN) 1 % GEL APPLY 2 G TOPICALLY 3 (THREE) TIMES DAILY AS NEEDED.  . fluconazole (DIFLUCAN) 150 MG tablet Take 150 mg by mouth once.  . hydroxyurea (HYDREA) 500 MG capsule TAKE 1 TABLET (500 MG) DAILY EXCEPT 2 TABLETS ( 1000 MG ) ON WEDNESDAY, SATURDAY AND SUNDAY  . ipratropium-albuterol (DUONEB) 0.5-2.5 (3) MG/3ML SOLN Take 3 mLs by nebulization every 6 (six) hours as needed (shortness of breath).  . lidocaine (LIDODERM) 5 % Place 1 patch daily onto the skin. Remove & Discard patch within 12 hours or as directed by MD (Patient taking differently: Place 1 patch onto the skin daily as needed (pain). Remove & Discard patch within 12 hours or as directed by MD)  . methimazole (TAPAZOLE) 5 MG tablet 5 mg twice week.  . NONFORMULARY OR COMPOUNDED ITEM Topical cream with equal parts diclofenac, gabapentin, lidocaine, menthol  Disp 100 gm  . Oral Hygiene Products (Q-CARE COVERD YANKAUER/SUCTION) MISC Use as directed  . OXYGEN 2lpm 24/7  DME- [facility]  . pantoprazole sodium (PROTONIX) 40 mg/20 mL PACK Take 20 mLs (40 mg total) by mouth 2 (two) times daily.  . polyethylene glycol (MIRALAX / GLYCOLAX) packet TAKE 17G BY MOUTH TWICE A DAY (Patient taking differently: Take 17 g by mouth 2 (two) times  daily as needed for mild constipation. )  . pregabalin (LYRICA) 50 MG capsule TAKE 1 CAPSULE (50 MG TOTAL) BY MOUTH 3 (THREE) TIMES DAILY.  . Probiotic Product (ALIGN) 4 MG CAPS Take 4 mg by mouth daily.   . propranolol (INDERAL) 10 MG tablet TAKE 0.5 TABLETS (5 MG TOTAL) BY MOUTH 2 (TWO) TIMES DAILY.  . traMADol (ULTRAM) 50 MG tablet TAKE 2 TABLETS BY MOUTH 3 TIMES A DAY AS NEEDED  . traZODone (DESYREL) 50 MG tablet TAKE 1 TABLET (50 MG TOTAL) BY MOUTH AT BEDTIME AS NEEDED. FOR SLEEP  . warfarin (COUMADIN) 4 MG tablet TAKE 1 TABLET DAILY EXCEPT 1/2 ON SUNDAY AND THURSDAYS  OR TAKE AS DIRECTED BY ANTICOAGULATION CLINIC. 90 DAY   No facility-administered encounter medications on file as of 05/04/2019.    Activities of Daily Living In your present state of health, do you have any difficulty performing the following activities: 05/04/2019  Hearing? Y  Vision? N  Difficulty concentrating or making decisions? N  Walking or climbing stairs? Y  Dressing or bathing? Y  Doing errands, shopping? Y  Preparing Food and eating ? Y  Using the Toilet? Y  In the past six months, have you accidently leaked urine? Y  Do you have problems with loss of bowel control? Y  Managing your Medications? Y  Managing your Finances? Y  Housekeeping or managing your Housekeeping? Y  Some recent data might be hidden    Patient Care Team: [name], MD as PCP - General (Internal Medicine) Eunice Blase, MD as Consulting Physician (Family Medicine) Renato Shin, MD as Consulting Physician (Endocrinology) Netta Cedars, MD as Consulting Physician (Orthopedic Surgery) Lyndal Pulley, DO (Sports Medicine) Garvin Fila, MD (Neurology)    Assessment:   This is a routine wellness examination for Sandy.  Exercise Activities and Dietary recommendations Current Exercise Habits: The patient does not participate in regular exercise at present, Exercise limited by: neurologic condition(s);orthopedic  condition(s);respiratory conditions(s);cardiac condition(s)  Goals   None     Fall Risk Fall Risk  05/04/2019 01/26/2019 08/30/2018 01/24/2018 11/23/2017  Falls in the past year? 0 Exclusion - non ambulatory 0 0 0  Number falls in past yr: 0 - - - -  Injury with Fall? 0 - - - -  Risk for fall due to : Impaired mobility;Impaired balance/gait - - - -  Follow up Falls evaluation completed;Education provided;Follow up appointment - - - -   Is the patient's home free of loose throw rugs in walkways, pet beds, electrical cords, etc?   yes      Grab bars in the bathroom? yes      Handrails on the stairs?   yes      Adequate lighting?   yes  Depression Screen PHQ 2/9 Scores 05/04/2019 08/17/2017 07/20/2017 08/21/2016  PHQ - 2 Score 0 0 0 0           Immunization History  Administered Date(s) Administered  . Fluad Quad(high Dose 65+) 10/18/2018  . Influenza Split 10/28/2010  . Influenza, High Dose Seasonal PF 11/26/2014, 09/09/2016  . Influenza, Seasonal, Injecte, Preservative Fre 12/24/2011  . Influenza,inj,Quad PF,6+ Mos 08/31/2012, 10/25/2015  . Influenza-Unspecified 08/31/2012, 10/25/2015  . Pneumococcal Conjugate-13 11/26/2014  . Pneumococcal Polysaccharide-23 07/22/2010  . Tdap 07/22/2010    Qualifies for Shingles Vaccine? declined  Screening Tests Health Maintenance  Topic Date Due  . COVID-19 Vaccine (1) Never done  . DEXA SCAN  Never done  . INFLUENZA VACCINE  08/06/2019  . TETANUS/TDAP  07/21/2020  . PNA vac Low Risk Adult  Completed    Cancer Screenings: Lung: Low Dose CT Chest recommended if Age 20-80 years, 30 pack-year currently smoking OR have quit w/in 15years. Patient does not qualify. Breast:  Up to date on Mammogram? No   Up to date of Bone Density/Dexa? No Colorectal: No     Plan:     Reviewed health maintenance screenings with patient today and relevant education, vaccines, and/or referrals were provided.    Continue doing brain stimulating  activities (puzzles, reading, adult coloring books, staying active) to keep memory sharp.    Continue to eat heart healthy diet (full of  fruits, vegetables, whole grains, lean protein, water--limit salt, fat, and sugar intake) and increase physical activity as tolerated.   I have personally reviewed and noted the following in the patient's chart:   . Medical and social history . Use of alcohol, tobacco or illicit drugs  . Current medications and supplements . Functional ability and status . Nutritional status . Physical activity . Advanced directives . List of other physicians . Hospitalizations, surgeries, and ER visits in previous 12 months . Vitals . Screenings to include cognitive, depression, and falls . Referrals and appointments  In addition, I have reviewed and discussed with patient certain preventive protocols, quality metrics, and best practice recommendations. A written personalized care plan for preventive services as well as general preventive health recommendations were provided to patient.     Sheral Flow, LPN  [id number]  Nurse Health Advisor  Nurse Notes: There were no vitals filed for this visit. There is no height or weight on file to calculate BMI.

## 2019-05-04 NOTE — Patient Instructions (Signed)
Sandy Barrett , Thank you for taking time to come for your Medicare Wellness Visit. I appreciate your ongoing commitment to your health goals. Please review the following plan we discussed and let me know if I can assist you in the future.   Screening recommendations/referrals: Colorectal Screening: never done Mammogram: 04/25/2015 Bone Density: never done  Vision and Dental Exams: Recommended annual ophthalmology exams for early detection of glaucoma and other disorders of the eye Recommended annual dental exams for proper oral hygiene  Vaccinations: Influenza vaccine: last done on 10/18/2018 Pneumococcal vaccine: completed; Prevnar 11/26/2014, Pneumovax 07/22/2010 Tdap vaccine: last done on 07/22/2010; due every 10 years Shingles vaccine: declined Covid vaccine: declined   Advanced directives: Advance directives discussed with you today. You have declined to receive documents for completion.   Goals: Recommend to drink at least 6-8 8oz glasses of water per day.  Recommend to exercise for at least 150 minutes per week.  Recommend to remove any items from the home that may cause slips or trips.  Recommend to decrease portion sizes by eating 3 small healthy meals and at least 2 healthy snacks per day.  Recommend to begin DASH diet as directed below  Recommend to continue efforts to reduce smoking habits until no longer smoking. Smoking Cessation literature is attached below.  Next appointment: Please schedule your Annual Wellness Visit with your Nurse Health Advisor in one year.  Preventive Care 53 Years and Older, Female Preventive care refers to lifestyle choices and visits with your health care provider that can promote health and wellness. What does preventive care include?  A yearly physical exam. This is also called an annual well check.  Dental exams once or twice a year.  Routine eye exams. Ask your health care provider how often you should have your eyes  checked.  Personal lifestyle choices, including:  Daily care of your teeth and gums.  Regular physical activity.  Eating a healthy diet.  Avoiding tobacco and drug use.  Limiting alcohol use.  Practicing safe sex.  Taking low-dose aspirin every day if recommended by your health care provider.  Taking vitamin and mineral supplements as recommended by your health care provider. What happens during an annual well check? The services and screenings done by your health care provider during your annual well check will depend on your age, overall health, lifestyle risk factors, and family history of disease. Counseling  Your health care provider may ask you questions about your:  Alcohol use.  Tobacco use.  Drug use.  Emotional well-being.  Home and relationship well-being.  Sexual activity.  Eating habits.  History of falls.  Memory and ability to understand (cognition).  Work and work Statistician.  Reproductive health. Screening  You may have the following tests or measurements:  Height, weight, and BMI.  Blood pressure.  Lipid and cholesterol levels. These may be checked every 5 years, or more frequently if you are over 67 years old.  Skin check.  Lung cancer screening. You may have this screening every year starting at age 109 if you have a 30-pack-year history of smoking and currently smoke or have quit within the past 15 years.  Fecal occult blood test (FOBT) of the stool. You may have this test every year starting at age 68.  Flexible sigmoidoscopy or colonoscopy. You may have a sigmoidoscopy every 5 years or a colonoscopy every 10 years starting at age 64.  Hepatitis C blood test.  Hepatitis B blood test.  Sexually transmitted disease (STD) testing.  Diabetes screening. This is done by checking your blood sugar (glucose) after you have not eaten for a while (fasting). You may have this done every 1-3 years.  Bone density scan. This is done to  screen for osteoporosis. You may have this done starting at age 58.  Mammogram. This may be done every 1-2 years. Talk to your health care provider about how often you should have regular mammograms. Talk with your health care provider about your test results, treatment options, and if necessary, the need for more tests. Vaccines  Your health care provider may recommend certain vaccines, such as:  Influenza vaccine. This is recommended every year.  Tetanus, diphtheria, and acellular pertussis (Tdap, Td) vaccine. You may need a Td booster every 10 years.  Zoster vaccine. You may need this after age 19.  Pneumococcal 13-valent conjugate (PCV13) vaccine. One dose is recommended after age 12.  Pneumococcal polysaccharide (PPSV23) vaccine. One dose is recommended after age 40. Talk to your health care provider about which screenings and vaccines you need and how often you need them. This information is not intended to replace advice given to you by your health care provider. Make sure you discuss any questions you have with your health care provider. Document Released: 01/18/2015 Document Revised: 09/11/2015 Document Reviewed: 10/23/2014 Elsevier Interactive Patient Education  2017 [place] Prevention in the Home Falls can cause injuries. They can happen to people of all ages. There are many things you can do to make your home safe and to help prevent falls. What can I do on the outside of my home?  Regularly fix the edges of walkways and driveways and fix any cracks.  Remove anything that might make you trip as you walk through a door, such as a raised step or threshold.  Trim any bushes or trees on the path to your home.  Use bright outdoor lighting.  Clear any walking paths of anything that might make someone trip, such as rocks or tools.  Regularly check to see if handrails are loose or broken. Make sure that both sides of any steps have handrails.  Any raised decks  and porches should have guardrails on the edges.  Have any leaves, snow, or ice cleared regularly.  Use sand or salt on walking paths during winter.  Clean up any spills in your garage right away. This includes oil or grease spills. What can I do in the bathroom?  Use night lights.  Install grab bars by the toilet and in the tub and shower. Do not use towel bars as grab bars.  Use non-skid mats or decals in the tub or shower.  If you need to sit down in the shower, use a plastic, non-slip stool.  Keep the floor dry. Clean up any water that spills on the floor as soon as it happens.  Remove soap buildup in the tub or shower regularly.  Attach bath mats securely with double-sided non-slip rug tape.  Do not have throw rugs and other things on the floor that can make you trip. What can I do in the bedroom?  Use night lights.  Make sure that you have a light by your bed that is easy to reach.  Do not use any sheets or blankets that are too big for your bed. They should not hang down onto the floor.  Have a firm chair that has side arms. You can use this for support while you get dressed.  Do not have throw  rugs and other things on the floor that can make you trip. What can I do in the kitchen?  Clean up any spills right away.  Avoid walking on wet floors.  Keep items that you use a lot in easy-to-reach places.  If you need to reach something above you, use a strong step stool that has a grab bar.  Keep electrical cords out of the way.  Do not use floor polish or wax that makes floors slippery. If you must use wax, use non-skid floor wax.  Do not have throw rugs and other things on the floor that can make you trip. What can I do with my stairs?  Do not leave any items on the stairs.  Make sure that there are handrails on both sides of the stairs and use them. Fix handrails that are broken or loose. Make sure that handrails are as long as the stairways.  Check any  carpeting to make sure that it is firmly attached to the stairs. Fix any carpet that is loose or worn.  Avoid having throw rugs at the top or bottom of the stairs. If you do have throw rugs, attach them to the floor with carpet tape.  Make sure that you have a light switch at the top of the stairs and the bottom of the stairs. If you do not have them, ask someone to add them for you. What else can I do to help prevent falls?  Wear shoes that:  Do not have high heels.  Have rubber bottoms.  Are comfortable and fit you well.  Are closed at the toe. Do not wear sandals.  If you use a stepladder:  Make sure that it is fully opened. Do not climb a closed stepladder.  Make sure that both sides of the stepladder are locked into place.  Ask someone to hold it for you, if possible.  Clearly mark and make sure that you can see:  Any grab bars or handrails.  First and last steps.  Where the edge of each step is.  Use tools that help you move around (mobility aids) if they are needed. These include:  Canes.  Walkers.  Scooters.  Crutches.  Turn on the lights when you go into a dark area. Replace any light bulbs as soon as they burn out.  Set up your furniture so you have a clear path. Avoid moving your furniture around.  If any of your floors are uneven, fix them.  If there are any pets around you, be aware of where they are.  Review your medicines with your doctor. Some medicines can make you feel dizzy. This can increase your chance of falling. Ask your doctor what other things that you can do to help prevent falls. This information is not intended to replace advice given to you by your health care provider. Make sure you discuss any questions you have with your health care provider. Document Released: 10/18/2008 Document Revised: 05/30/2015 Document Reviewed: 01/26/2014 Elsevier Interactive Patient Education  2017 [employer].

## 2019-05-05 ENCOUNTER — Telehealth: Payer: Self-pay | Admitting: Critical Care Medicine

## 2019-05-05 ENCOUNTER — Telehealth: Payer: Self-pay | Admitting: Internal Medicine

## 2019-05-05 DIAGNOSIS — I69320 Aphasia following cerebral infarction: Secondary | ICD-10-CM | POA: Diagnosis not present

## 2019-05-05 DIAGNOSIS — M150 Primary generalized (osteo)arthritis: Secondary | ICD-10-CM | POA: Diagnosis not present

## 2019-05-05 DIAGNOSIS — L89323 Pressure ulcer of left buttock, stage 3: Secondary | ICD-10-CM | POA: Diagnosis not present

## 2019-05-05 DIAGNOSIS — I972 Postmastectomy lymphedema syndrome: Secondary | ICD-10-CM | POA: Diagnosis not present

## 2019-05-05 DIAGNOSIS — I69351 Hemiplegia and hemiparesis following cerebral infarction affecting right dominant side: Secondary | ICD-10-CM | POA: Diagnosis not present

## 2019-05-05 DIAGNOSIS — L89153 Pressure ulcer of sacral region, stage 3: Secondary | ICD-10-CM | POA: Diagnosis not present

## 2019-05-05 NOTE — Telephone Encounter (Signed)
I would continue mucinex 600mg  twice daily and use Duoneb every 6 hours. She has an apt with Dr. Carlis Abbott on May 10th. I would not start anything else until then.

## 2019-05-05 NOTE — Telephone Encounter (Signed)
Pt will try pulmonary to see if there is an inhaler that can be given to help with mucus.

## 2019-05-05 NOTE — Telephone Encounter (Signed)
Has she tried giving her mucinex on a regular basis to help thin the mucus?  It is ok to take this regularly.

## 2019-05-05 NOTE — Telephone Encounter (Signed)
    Sandy Barrett calling for advice regarding patient recent episodes of congestion that is causing some choking Declined to schedule appointment Seeking advice only, please call

## 2019-05-05 NOTE — Telephone Encounter (Signed)
Called and spoke with Sandy Barrett letting her know the info stated by [facility] and she verbalized understanding. Nothing further needed.

## 2019-05-05 NOTE — Telephone Encounter (Signed)
Called and spoke with pt's daughter [name] who staetd that pt has had a rattle in throat which she stated sounds like phlegm. She stated this has been going on x1 week.  [name] said pt was having problems trying to eat chicken noodle soup yesterday and was having problems getting it down due to the mucus.  [name] stated she has tried to use a nebulizer, vaporizer with steam, and mucinex to see if that would help loosen the mucus.  Pt stated she called PCP in regards to this and they mentioned that pt might want to try an inhaler to see if that would help but also stated for them to contact our office for recommendations.  Beth, please advise on this.

## 2019-05-05 NOTE — Telephone Encounter (Signed)
x1 week pt has had congestion and mucus in her throat and chest. States that there is a rattling noise in her throat when she is trying to eat. No fever and seems to be in a good mood. She states yesterday she had an issue with eating soup and could not get it down without gagging due to phlegm. The daughter did use the suction on her yesterday and got some stuff out but she wants to make sure there is nothing else she should be doing or any other suggestions you might have.

## 2019-05-09 DIAGNOSIS — I972 Postmastectomy lymphedema syndrome: Secondary | ICD-10-CM | POA: Diagnosis not present

## 2019-05-09 DIAGNOSIS — L89323 Pressure ulcer of left buttock, stage 3: Secondary | ICD-10-CM | POA: Diagnosis not present

## 2019-05-09 DIAGNOSIS — M150 Primary generalized (osteo)arthritis: Secondary | ICD-10-CM | POA: Diagnosis not present

## 2019-05-09 DIAGNOSIS — I69320 Aphasia following cerebral infarction: Secondary | ICD-10-CM | POA: Diagnosis not present

## 2019-05-09 DIAGNOSIS — L89153 Pressure ulcer of sacral region, stage 3: Secondary | ICD-10-CM | POA: Diagnosis not present

## 2019-05-09 DIAGNOSIS — I69351 Hemiplegia and hemiparesis following cerebral infarction affecting right dominant side: Secondary | ICD-10-CM | POA: Diagnosis not present

## 2019-05-10 ENCOUNTER — Telehealth: Payer: Self-pay | Admitting: Internal Medicine

## 2019-05-10 NOTE — Telephone Encounter (Signed)
New Message:   Pt is calling and state she would now like to have the chest x-ray that was discussed with her last week. Please advise.

## 2019-05-10 NOTE — Progress Notes (Signed)
Subjective:    Patient ID: Sandy Barrett, female    DOB: 1932/11/15, 86 y.o.   MRN: [id number]  HPI The patient is here for an acute visit.  Her daughter brings her in today and provides all the history because the patient is nonverbal.   Congestion: For a little over a week her daughter has heard congestion in her throat and in her chest.  She is a rattling sound and it is worse when she lays down.  She has noticed that with manipulating and turning her in bed she seemed to have more shortness of breath.  She is wheelchair-bound and she does not ambulate.  She has not noticed any obvious wheezing.  She is not coughing much, but there has been some question about if she still is able to cough.  There have been no fevers or chills.  Her appetite has been good.    She has been giving her Mucinex and doing nebulizer treatments every 6 hours.  She has been using the suction on her, but the daughter does not like that and will only allow it so much.  She does have an increased risk of aspiration pneumonia due ot dyshpagia.  Medications and allergies reviewed with patient and updated if appropriate.  Patient Active Problem List   Diagnosis Date Noted  . Urinary incontinence without sensory awareness 04/26/2019  . Eye disorder 02/12/2019  . GERD (gastroesophageal reflux disease) 02/12/2019  . Decubitus ulcer of back, unstageable ([place]) 10/25/2018  . Right breast lymphedema 10/18/2018  . Sleep difficulties 05/24/2018  . Right shoulder pain 11/24/2017  . Hallucinations 11/24/2017  . Adhesive capsulitis of right shoulder 11/23/2017  . Bilateral leg edema 05/24/2017  . Cholecystitis 02/23/2017  . Chronic respiratory failure with hypoxia ([place]) 02/23/2017  . Neuralgia 01/09/2017  . Venous congestion 10/08/2016  . Lymphedema of breast 09/11/2016  . Long term (current) use of anticoagulants [Z79.01] 09/10/2016  . Left hip pain 08/09/2016  . Left leg swelling 05/18/2016  . Pressure ulcer  04/27/2016  . Aspiration pneumonia ([place]) 04/20/2016  . Hemidiaphragm paralysis   . Wheelchair bound 02/03/2016  . Discoloration of skin 09/26/2015  . Essential thrombocytosis ([place]) 07/31/2015  . SVC (superior vena cava obstruction), chronic 07/20/2015  . Dysphagia 07/20/2015  . Constipation 07/20/2015  . Thrombophlebitis of breast, right 06/18/2015  . Tracheal deviation 06/06/2015  . Sensorineural hearing loss, bilateral, moderate-moderately severe 04/30/2015  . Abdominal wall lump 03/09/2015  . Frequent UTI 02/06/2015  . Primary osteoarthritis involving multiple joints 07/26/2014  . Pernicious anemia 07/26/2014  . Encounter for therapeutic drug monitoring 07/17/2014  . Spastic hemiparesis affecting dominant side ([place]), right 05/31/2014  . DVT of lower extremity, bilateral ([place]) 03/14/2014  . Global aphasia 03/14/2014  . 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 Aphasia S/P CVA 03/13/2014  . Cerebral infarction due to embolism of left middle cerebral artery ([place])   . Stroke, embolic ([place]) [id number]  . History of pulmonary embolism 03/08/2014  . Primary localized osteoarthrosis, lower leg 03/06/2013  . IBS (irritable bowel syndrome)   . Multinodular goiter 01/13/2011  . Hyperthyroidism 11/11/2009  . Osteoarthritis 04/21/2008  . Migraine headache 04/20/2008    Current Outpatient Medications on File Prior to Visit  Medication Sig Dispense Refill  . acetaminophen (TYLENOL) 160 MG/5ML liquid Take 480 mg by mouth every 4 (four) hours as needed for fever or pain.    . Calamine-Zinc Oxide LOTN Apply topically.    . ciclopirox (PENLAC) 8 % solution APPLY  TOPICALLY AT BEDTIME OVER NAIL & SURROUNDING SKIN,APPLY DAILY OVER PREVIOUS COAT,AFTER 7 DAYS REMOVE WITH ALCOHOL AND CONTINUE CYCLE 6.6 mL 5  . diclofenac Sodium (VOLTAREN) 1 % GEL APPLY 2 G TOPICALLY 3 (THREE) TIMES DAILY AS NEEDED. 300 g 0  . fluconazole (DIFLUCAN) 150 MG tablet Take 150 mg by mouth once.    . hydroxyurea (HYDREA) 500 MG  capsule TAKE 1 TABLET (500 MG) DAILY EXCEPT 2 TABLETS ( 1000 MG ) ON WEDNESDAY, SATURDAY AND SUNDAY 360 capsule 1  . ipratropium-albuterol (DUONEB) 0.5-2.5 (3) MG/3ML SOLN Take 3 mLs by nebulization every 6 (six) hours as needed (shortness of breath). 360 mL 3  . lidocaine (LIDODERM) 5 % Place 1 patch daily onto the skin. Remove & Discard patch within 12 hours or as directed by MD (Patient taking differently: Place 1 patch onto the skin daily as needed (pain). Remove & Discard patch within 12 hours or as directed by MD) 15 patch 0  . methimazole (TAPAZOLE) 5 MG tablet 5 mg twice week.    . NONFORMULARY OR COMPOUNDED ITEM Topical cream with equal parts diclofenac, gabapentin, lidocaine, menthol  Disp 100 gm 1 each 5  . Oral Hygiene Products (Q-CARE COVERD [place]/SUCTION) MISC Use as directed 10 each 5  . OXYGEN 2lpm 24/7  DME- [facility]    . pantoprazole sodium (PROTONIX) 40 mg/20 mL PACK Take 20 mLs (40 mg total) by mouth 2 (two) times daily. 80 mL 3  . polyethylene glycol (MIRALAX / GLYCOLAX) packet TAKE 17G BY MOUTH TWICE A DAY (Patient taking differently: Take 17 g by mouth 2 (two) times daily as needed for mild constipation. ) 14 packet 1  . pregabalin (LYRICA) 50 MG capsule TAKE 1 CAPSULE (50 MG TOTAL) BY MOUTH 3 (THREE) TIMES DAILY. 75 capsule 1  . Probiotic Product (ALIGN) 4 MG CAPS Take 4 mg by mouth daily.     . propranolol (INDERAL) 10 MG tablet TAKE 0.5 TABLETS (5 MG TOTAL) BY MOUTH 2 (TWO) TIMES DAILY. 60 tablet 2  . traMADol (ULTRAM) 50 MG tablet TAKE 2 TABLETS BY MOUTH 3 TIMES A DAY AS NEEDED 180 tablet 1  . traZODone (DESYREL) 50 MG tablet TAKE 1 TABLET (50 MG TOTAL) BY MOUTH AT BEDTIME AS NEEDED. FOR SLEEP 90 tablet 1  . warfarin (COUMADIN) 4 MG tablet TAKE 1 TABLET DAILY EXCEPT 1/2 ON SUNDAY AND THURSDAYS OR TAKE AS DIRECTED BY ANTICOAGULATION CLINIC. 90 DAY 90 tablet 1   No current facility-administered medications on file prior to visit.    Past Medical History:  Diagnosis Date    . ARTHRITIS   . Arthritis   . Diverticulosis   . Essential thrombocytosis ([place])   . HYPERTENSION   . HYPERTHYROIDISM   . Hyperthyroidism    s/p I-131 ablation 03/2011 of multinod goiter  . INSOMNIA   . MIGRAINE HEADACHE   . OBESITY   . Posttraumatic stress disorder   . Pulmonary embolism ([place]) 03/2014   with DVT  . Stroke [facility]) 03/2014   dysarthria    Past Surgical History:  Procedure Laterality Date  . ABDOMINAL HYSTERECTOMY  1976  . BREAST SURGERY     biopsy  . RADIOLOGY WITH ANESTHESIA Left 03/08/2014   Procedure: RADIOLOGY WITH ANESTHESIA;  Surgeon: Rob Hickman, MD;  Location: [place];  Service: Radiology;  Laterality: Left;  . TONSILLECTOMY    . TOTAL HIP ARTHROPLASTY  1998    right    Social History   Socioeconomic History  .  Marital status: Widowed    Spouse name: Not on file  . Number of children: 2  . Years of education: 70  . Highest education level: Not on file  Occupational History  . Occupation: retired    Comment: Restaurant manager, fast food  Tobacco Use  . Smoking status: Never Smoker  . Smokeless tobacco: Never Used  Substance and Sexual Activity  . Alcohol use: No    Alcohol/week: 0.0 standard drinks  . Drug use: No  . Sexual activity: Never  Other Topics Concern  . Not on file  Social History Narrative   Widowed, lived alone prior to CVA 03/2014   SNF at [facility], then home 07/2014 with dtr   Right handed   Caffeine use- occasionally drinks tea   Social Determinants of Health   Financial Resource Strain:   . Difficulty of Paying Living Expenses:   Food Insecurity:   . Worried About Charity fundraiser in the Last Year:   . Arboriculturist in the Last Year:   Transportation Needs:   . Film/video editor (Medical):   [name] Lack of Transportation (Non-Medical):   Physical Activity:   . Days of Exercise per Week:   . Minutes of Exercise per Session:   Stress:   . Feeling of Stress :   Social Connections:   . Frequency of  Communication with Friends and Family:   . Frequency of Social Gatherings with Friends and Family:   . Attends Religious Services:   . Active Member of Clubs or Organizations:   . Attends Archivist Meetings:   [name] Marital Status:     Family History  Problem Relation Age of Onset  . Asthma Mother   . Asthma Father   . Prostate cancer Father   . Stroke Brother   . Breast cancer Sister   . Stomach cancer Sister   . Thyroid disease Neg Hx     Review of Systems  Unable to perform ROS: Patient nonverbal       Objective:   Vitals:   05/11/19 1116  BP: 116/72  Pulse: 78  Resp: 18  Temp: 98.7 F (37.1 C)  SpO2: 91%   BP Readings from Last 3 Encounters:  05/11/19 116/72  04/11/19 (!) 112/57  02/10/19 132/86   Wt Readings from Last 3 Encounters:  11/23/18 176 lb (79.8 kg)  08/30/18 176 lb (79.8 kg)  05/24/18 175 lb (79.4 kg)   Body mass index is 34.37 kg/m.   Physical Exam    GENERAL APPEARANCE: Chronically ill appearing, NAD EYES: conjunctiva clear, no icterus LUNGS:  unlabored breathing, good air entry bilaterally rattling heard in throat standing next to patient, rhonchi bilateral lungs from top to bottom-right side slightly worse CARDIOVASCULAR: Normal S1,S2 without murmurs, 1+ bilateral lower extremity edema SKIN: Warm, dry      Assessment & Plan:    See Problem List for Assessment and Plan of chronic medical problems.    This visit occurred during the SARS-CoV-2 public health emergency.  Safety protocols were in place, including screening questions prior to the visit, additional usage of staff PPE, and extensive cleaning of exam room while observing appropriate contact time as indicated for disinfecting solutions.

## 2019-05-10 NOTE — Telephone Encounter (Signed)
Appointment scheduled for tomorrow.

## 2019-05-10 NOTE — Telephone Encounter (Signed)
Can patient have a chest xray for the congestion or do you want her to have a visit first?

## 2019-05-11 ENCOUNTER — Ambulatory Visit (INDEPENDENT_AMBULATORY_CARE_PROVIDER_SITE_OTHER): Payer: Medicare Other

## 2019-05-11 ENCOUNTER — Encounter: Payer: Self-pay | Admitting: Internal Medicine

## 2019-05-11 ENCOUNTER — Ambulatory Visit (INDEPENDENT_AMBULATORY_CARE_PROVIDER_SITE_OTHER): Payer: Medicare Other | Admitting: Internal Medicine

## 2019-05-11 ENCOUNTER — Other Ambulatory Visit: Payer: Self-pay

## 2019-05-11 VITALS — BP 116/72 | HR 78 | Temp 98.7°F | Resp 18 | Ht 60.0 in

## 2019-05-11 DIAGNOSIS — R0989 Other specified symptoms and signs involving the circulatory and respiratory systems: Secondary | ICD-10-CM

## 2019-05-11 DIAGNOSIS — R05 Cough: Secondary | ICD-10-CM | POA: Diagnosis not present

## 2019-05-11 MED ORDER — AMOXICILLIN-POT CLAVULANATE 875-125 MG PO TABS
1.0000 | ORAL_TABLET | Freq: Two times a day (BID) | ORAL | 0 refills | Status: DC
Start: 2019-05-11 — End: 2019-08-10

## 2019-05-11 NOTE — Assessment & Plan Note (Addendum)
Acute Bilateral rhonchi-concern for bronchitis, possible pneumonia She is high risk for aspiration pneumonia and has difficulty clearing her secretions.  Possibly no cough reflex Has moderate narrowing of the trachea secondary to large goiter and dysphagia Chest x-ray today We will start Augmentin twice daily x10 days for possible bronchitis/pneumonia Continue nebulizer treatments every 6 hours and Mucinex Advised chest PT Has an upcoming appointment with pulmonary-?  Candidate for N-acetylcysteine  Advised for her daughter to monitor her closely over the next day or 2 and call if her symptoms worsen.  Currently oxygen level 91% on room air and not in any respiratory distress.

## 2019-05-11 NOTE — Patient Instructions (Signed)
Start the augmentin.  Continue mucinex and the neb treatment.  Do chest PT.    Have a chest x-ray today downstairs.

## 2019-05-12 DIAGNOSIS — M150 Primary generalized (osteo)arthritis: Secondary | ICD-10-CM | POA: Diagnosis not present

## 2019-05-12 DIAGNOSIS — I69320 Aphasia following cerebral infarction: Secondary | ICD-10-CM

## 2019-05-12 DIAGNOSIS — Z86711 Personal history of pulmonary embolism: Secondary | ICD-10-CM

## 2019-05-12 DIAGNOSIS — Z7901 Long term (current) use of anticoagulants: Secondary | ICD-10-CM

## 2019-05-12 DIAGNOSIS — D473 Essential (hemorrhagic) thrombocythemia: Secondary | ICD-10-CM

## 2019-05-12 DIAGNOSIS — R1310 Dysphagia, unspecified: Secondary | ICD-10-CM

## 2019-05-12 DIAGNOSIS — I69351 Hemiplegia and hemiparesis following cerebral infarction affecting right dominant side: Secondary | ICD-10-CM

## 2019-05-12 DIAGNOSIS — L89153 Pressure ulcer of sacral region, stage 3: Secondary | ICD-10-CM

## 2019-05-12 DIAGNOSIS — I972 Postmastectomy lymphedema syndrome: Secondary | ICD-10-CM | POA: Diagnosis not present

## 2019-05-12 DIAGNOSIS — L89323 Pressure ulcer of left buttock, stage 3: Secondary | ICD-10-CM

## 2019-05-12 DIAGNOSIS — Z5181 Encounter for therapeutic drug level monitoring: Secondary | ICD-10-CM

## 2019-05-12 DIAGNOSIS — K589 Irritable bowel syndrome without diarrhea: Secondary | ICD-10-CM

## 2019-05-14 ENCOUNTER — Other Ambulatory Visit: Payer: Self-pay | Admitting: Internal Medicine

## 2019-05-14 DIAGNOSIS — I69320 Aphasia following cerebral infarction: Secondary | ICD-10-CM | POA: Diagnosis not present

## 2019-05-14 DIAGNOSIS — I972 Postmastectomy lymphedema syndrome: Secondary | ICD-10-CM | POA: Diagnosis not present

## 2019-05-14 DIAGNOSIS — Z5181 Encounter for therapeutic drug level monitoring: Secondary | ICD-10-CM | POA: Diagnosis not present

## 2019-05-14 DIAGNOSIS — M150 Primary generalized (osteo)arthritis: Secondary | ICD-10-CM | POA: Diagnosis not present

## 2019-05-14 DIAGNOSIS — K589 Irritable bowel syndrome without diarrhea: Secondary | ICD-10-CM | POA: Diagnosis not present

## 2019-05-14 DIAGNOSIS — R1310 Dysphagia, unspecified: Secondary | ICD-10-CM | POA: Diagnosis not present

## 2019-05-14 DIAGNOSIS — Z7901 Long term (current) use of anticoagulants: Secondary | ICD-10-CM | POA: Diagnosis not present

## 2019-05-14 DIAGNOSIS — I69351 Hemiplegia and hemiparesis following cerebral infarction affecting right dominant side: Secondary | ICD-10-CM | POA: Diagnosis not present

## 2019-05-14 DIAGNOSIS — D473 Essential (hemorrhagic) thrombocythemia: Secondary | ICD-10-CM | POA: Diagnosis not present

## 2019-05-14 DIAGNOSIS — L89323 Pressure ulcer of left buttock, stage 3: Secondary | ICD-10-CM | POA: Diagnosis not present

## 2019-05-14 DIAGNOSIS — Z86711 Personal history of pulmonary embolism: Secondary | ICD-10-CM | POA: Diagnosis not present

## 2019-05-14 DIAGNOSIS — L89153 Pressure ulcer of sacral region, stage 3: Secondary | ICD-10-CM | POA: Diagnosis not present

## 2019-05-15 ENCOUNTER — Ambulatory Visit (INDEPENDENT_AMBULATORY_CARE_PROVIDER_SITE_OTHER): Payer: Medicare Other | Admitting: Critical Care Medicine

## 2019-05-15 ENCOUNTER — Other Ambulatory Visit: Payer: Self-pay

## 2019-05-15 DIAGNOSIS — I972 Postmastectomy lymphedema syndrome: Secondary | ICD-10-CM | POA: Diagnosis not present

## 2019-05-15 DIAGNOSIS — R1311 Dysphagia, oral phase: Secondary | ICD-10-CM

## 2019-05-15 DIAGNOSIS — E049 Nontoxic goiter, unspecified: Secondary | ICD-10-CM | POA: Diagnosis not present

## 2019-05-15 DIAGNOSIS — I69351 Hemiplegia and hemiparesis following cerebral infarction affecting right dominant side: Secondary | ICD-10-CM | POA: Diagnosis not present

## 2019-05-15 DIAGNOSIS — J9611 Chronic respiratory failure with hypoxia: Secondary | ICD-10-CM | POA: Diagnosis not present

## 2019-05-15 DIAGNOSIS — L89153 Pressure ulcer of sacral region, stage 3: Secondary | ICD-10-CM | POA: Diagnosis not present

## 2019-05-15 DIAGNOSIS — R5381 Other malaise: Secondary | ICD-10-CM | POA: Diagnosis not present

## 2019-05-15 DIAGNOSIS — I69320 Aphasia following cerebral infarction: Secondary | ICD-10-CM | POA: Diagnosis not present

## 2019-05-15 DIAGNOSIS — M150 Primary generalized (osteo)arthritis: Secondary | ICD-10-CM | POA: Diagnosis not present

## 2019-05-15 DIAGNOSIS — L89323 Pressure ulcer of left buttock, stage 3: Secondary | ICD-10-CM | POA: Diagnosis not present

## 2019-05-15 MED ORDER — AZELASTINE HCL 0.1 % NA SOLN: 2.0000 | Freq: Two times a day (BID) | NASAL | 11 refills | Status: AC

## 2019-05-15 NOTE — Patient Instructions (Addendum)
Thank you for visiting Dr. Carlis Abbott at [facility]. We recommend the following: No orders of the defined types were placed in this encounter.  No orders of the defined types were placed in this encounter.   Meds ordered this encounter  Medications  . azelastine (ASTELIN) 0.1 % nasal spray    Sig: Place 2 sprays into both nostrils 2 (two) times daily. Use in each nostril as directed    Dispense:  30 mL    Refill:  11    Return in about 4 weeks (around 06/12/2019).    Please do your part to reduce the spread of COVID-19.

## 2019-05-15 NOTE — Progress Notes (Signed)
Synopsis: Referred in March 2019 for post-hospitalization for pneumonia by Sandy Rail, MD.  Previously patient of Dr. Melvyn Barrett.  Subjective:   PATIENT ID: Sandy Barrett GENDER: female DOB: 05-21-1932, MRN: [id number]  Chief Complaint  Patient presents with  . televist    increased congestion, increased SOB since fir about 2 weeks    Virtual Visit via Telephone Note I connected with Shontee Eleby with her mother Mariabelen Chugh  on 05/15/19 at  4:15 PM EDT by telephone and verified that I am speaking with the correct person using two identifiers.  Location: Patient: Home Provider: [facility] - [id number], [place], [place] [address]   I discussed the limitations, risks, security and privacy concerns of performing an evaluation and management service by telephone and the availability of in person appointments. I also discussed with the patient that there may be a patient responsible charge related to this service. The patient expressed understanding and agreed to proceed.  Patient consented to consult via telephone: Yes People present and their role in pt care: Patient, daughter  I discussed the assessment and treatment plan with the patient. The patient was provided an opportunity to ask questions and all were answered. The patient agreed with the plan and demonstrated an understanding of the instructions.   The patient was advised to call back or seek an in-person evaluation if the symptoms worsen or if the condition fails to improve as anticipated.  I provided 24 minutes of non-face-to-face time during this encounter.   Ms. Bardos presents for a televisit for throat & chest congestion. She has a history of chronic dysphagia due to previous CVA and an obstructing thyroid mass. She has a gurgling sound in the back of her throat and has trouble coughing it up; overall she has minimal cough. She is unsure if her mother can swallow secretions, but she  is not drooling. Her daughter has been doing CPT with tapping on her chest and back and giving her nebulizers every 6 hours.  She has been using humidifier in the house.  Her daughter has a suction catheter that she has been using to help clear secretions since her mother has a weak cough.  No rhinorrhea or fevers.  She thinks she has post-nasal drip that may be contributing.  She was started on augmentin for a URI last week, which has helped some.  She has been using her 2 L oxygen more frequently.  For the past several months she has noticed that she has been more short of breath when she has been having to roll her from side to side with bathing and dressing her.  She has been following up with [facility] regarding her thyroid mass, and they have discussed that she will likely need tracheostomy and PEG at some point due to progression.  They are considering doing another embolization procedure to improve the size of the mass.    OV 11/2018: [name] is an 86 year old woman with a history of CVA, PE on chronic warfarin, and chronic debility with multiple previous episodes of pneumonia who presents today to establish care.  She was previously seen by Dr. Melvyn Barrett.  Her daughter who is her full-time caregiver accompanies her, and is providing all the history as the patient is aphasic from her previous stroke.  She was hospitalized in 2018 in 2019 for pneumonia.  Her daughter is concerned about her mother's occasional need for oxygen.  When she arrived in the office today  she was satting 83% on room air but without any noticeable signs of distress.  At times she is breathing faster at home and her daughter will place her on supplemental oxygen-2 L.  She sleeps with oxygen at night.  She has a portable concentrator, but does not use oxygen on a regular basis.  Her mother has chronic dysphagia, but does not always follow a dysphagia diet.  At times she will escalate the diet, but if she senses that her mother is  likely aspirating, she will de-escalate to a dysphagia diet.  She is unsure how strong her mother's cough is.  In addition to being aphasic, her mother is wheelchair-bound.  Her mother remains on Coumadin, which is managed by the low our Coumadin clinic.  She has not had evidence of bleeding or complications related to Coumadin.    She has a history of a large substernal goiter, which has been treated at [facility] with embolization.   Past Medical History:  Diagnosis Date  . ARTHRITIS   . Arthritis   . Diverticulosis   . Essential thrombocytosis ([place])   . HYPERTENSION   . HYPERTHYROIDISM   . Hyperthyroidism    s/p I-131 ablation 03/2011 of multinod goiter  . INSOMNIA   . MIGRAINE HEADACHE   . OBESITY   . Posttraumatic stress disorder   . Pulmonary embolism ([place]) 03/2014   with DVT  . Stroke [facility]) 03/2014   dysarthria     Family History  Problem Relation Age of Onset  . Asthma Mother   . Asthma Father   . Prostate cancer Father   . Stroke Brother   . Breast cancer Sister   . Stomach cancer Sister   . Thyroid disease Neg Hx      Past Surgical History:  Procedure Laterality Date  . ABDOMINAL HYSTERECTOMY  1976  . BREAST SURGERY     biopsy  . RADIOLOGY WITH ANESTHESIA Left 03/08/2014   Procedure: RADIOLOGY WITH ANESTHESIA;  Surgeon: Rob Hickman, MD;  Location: [place];  Service: Radiology;  Laterality: Left;  . TONSILLECTOMY    . TOTAL HIP ARTHROPLASTY  1998    right    Social History   Socioeconomic History  . Marital status: Widowed    Spouse name: Not on file  . Number of children: 2  . Years of education: 13  . Highest education level: Not on file  Occupational History  . Occupation: retired    Comment: Restaurant manager, fast food  Tobacco Use  . Smoking status: Never Smoker  . Smokeless tobacco: Never Used  Substance and Sexual Activity  . Alcohol use: No    Alcohol/week: 0.0 standard drinks  . Drug use: No  . Sexual activity: Never  Other Topics Concern  . Not  on file  Social History Narrative   Widowed, lived alone prior to CVA 03/2014   SNF at [facility], then home 07/2014 with dtr   Right handed   Caffeine use- occasionally drinks tea   Social Determinants of Health   Financial Resource Strain:   . Difficulty of Paying Living Expenses:   Food Insecurity:   . Worried About Charity fundraiser in the Last Year:   . Arboriculturist in the Last Year:   Transportation Needs:   . Film/video editor (Medical):   [name] Lack of Transportation (Non-Medical):   Physical Activity:   . Days of Exercise per Week:   . Minutes of Exercise per Session:  Stress:   . Feeling of Stress :   Social Connections:   . Frequency of Communication with Friends and Family:   . Frequency of Social Gatherings with Friends and Family:   . Attends Religious Services:   . Active Member of Clubs or Organizations:   . Attends Archivist Meetings:   [name] Marital Status:   Intimate Partner Violence:   . Fear of Current or Ex-Partner:   . Emotionally Abused:   [name] Physically Abused:   . Sexually Abused:      Allergies  Allergen Reactions  . Valtrex [Valacyclovir Hcl] Cough    Cough and gagging     Immunization History  Administered Date(s) Administered  . Fluad Quad(high Dose 65+) 10/18/2018  . Influenza Split 10/28/2010  . Influenza, High Dose Seasonal PF 11/26/2014, 09/09/2016  . Influenza, Seasonal, Injecte, Preservative Fre 12/24/2011  . Influenza,inj,Quad PF,6+ Mos 08/31/2012, 10/25/2015  . Influenza-Unspecified 08/31/2012, 10/25/2015  . PFIZER SARS-COV-2 Vaccination 02/16/2019, 03/13/2019  . Pneumococcal Conjugate-13 11/26/2014  . Pneumococcal Polysaccharide-23 07/22/2010  . Tdap 07/22/2010    Outpatient Medications Prior to Visit  Medication Sig Dispense Refill  . acetaminophen (TYLENOL) 160 MG/5ML liquid Take 480 mg by mouth every 4 (four) hours as needed for fever or pain.    [name] amoxicillin-clavulanate (AUGMENTIN) 875-125 MG  tablet Take 1 tablet by mouth 2 (two) times daily. 20 tablet 0  . Calamine-Zinc Oxide LOTN Apply topically.    . ciclopirox (PENLAC) 8 % solution APPLY TOPICALLY AT BEDTIME OVER NAIL & SURROUNDING SKIN,APPLY DAILY OVER PREVIOUS COAT,AFTER 7 DAYS REMOVE WITH ALCOHOL AND CONTINUE CYCLE 6.6 mL 5  . diclofenac Sodium (VOLTAREN) 1 % GEL APPLY 2 G TOPICALLY 3 (THREE) TIMES DAILY AS NEEDED. 300 g 0  . hydroxyurea (HYDREA) 500 MG capsule TAKE 1 TABLET (500 MG) DAILY EXCEPT 2 TABLETS ( 1000 MG ) ON WEDNESDAY, SATURDAY AND SUNDAY 360 capsule 1  . ipratropium-albuterol (DUONEB) 0.5-2.5 (3) MG/3ML SOLN Take 3 mLs by nebulization every 6 (six) hours as needed (shortness of breath). 360 mL 3  . lidocaine (LIDODERM) 5 % Place 1 patch daily onto the skin. Remove & Discard patch within 12 hours or as directed by MD (Patient taking differently: Place 1 patch onto the skin daily as needed (pain). Remove & Discard patch within 12 hours or as directed by MD) 15 patch 0  . methimazole (TAPAZOLE) 5 MG tablet 5 mg twice week.    . NONFORMULARY OR COMPOUNDED ITEM Topical cream with equal parts diclofenac, gabapentin, lidocaine, menthol  Disp 100 gm 1 each 5  . Oral Hygiene Products (Q-CARE COVERD YANKAUER/SUCTION) MISC Use as directed 10 each 5  . OXYGEN 2lpm 24/7  DME- [facility]    . pantoprazole sodium (PROTONIX) 40 mg/20 mL PACK Take 20 mLs (40 mg total) by mouth 2 (two) times daily. 80 mL 3  . polyethylene glycol (MIRALAX / GLYCOLAX) packet TAKE 17G BY MOUTH TWICE A DAY (Patient taking differently: Take 17 g by mouth 2 (two) times daily as needed for mild constipation. ) 14 packet 1  . pregabalin (LYRICA) 50 MG capsule TAKE 1 CAPSULE (50 MG TOTAL) BY MOUTH 3 (THREE) TIMES DAILY. 75 capsule 1  . Probiotic Product (ALIGN) 4 MG CAPS Take 4 mg by mouth daily.     . propranolol (INDERAL) 10 MG tablet TAKE 0.5 TABLETS (5 MG TOTAL) BY MOUTH 2 (TWO) TIMES DAILY. 60 tablet 2  . traMADol (ULTRAM) 50 MG tablet TAKE 2 TABLETS BY MOUTH  3 TIMES A DAY AS NEEDED 180 tablet 1  . traZODone (DESYREL) 50 MG tablet TAKE 1 TABLET (50 MG TOTAL) BY MOUTH AT BEDTIME AS NEEDED. FOR SLEEP 90 tablet 1  . warfarin (COUMADIN) 4 MG tablet TAKE 1 TABLET DAILY EXCEPT 1/2 ON SUNDAY AND THURSDAYS OR TAKE AS DIRECTED BY ANTICOAGULATION CLINIC. 90 DAY 90 tablet 1   No facility-administered medications prior to visit.    Review of Systems  Reason unable to perform ROS: limited due to the patient being nonverbal.     Objective:   There were no vitals filed for this visit.   on 2 LPM   She was saturating 83% when she arrived on RA.  BMI Readings from Last 3 Encounters:  05/11/19 34.37 kg/m  11/23/18 34.37 kg/m  10/25/18 34.37 kg/m   Wt Readings from Last 3 Encounters:  11/23/18 176 lb (79.8 kg)  08/30/18 176 lb (79.8 kg)  05/24/18 175 lb (79.4 kg)    Physical Exam  -limited due to televisit.  The patient's daughter provided all history as the patient has baseline aphasia.   CBC    Component Value Date/Time   WBC 14.7 (H) 04/11/2019 1503   RBC 3.48 (L) 04/11/2019 1503   HGB 11.7 (L) 04/11/2019 1503   HGB 14.1 10/06/2016 1439   HCT 38.3 04/11/2019 1503   HCT 43.5 10/06/2016 1439   PLT 716 (H) 04/11/2019 1503   PLT 259 Clumped Platelets--Appears Adequate 10/06/2016 1439   MCV 110.1 (H) 04/11/2019 1503   MCV 114.2 (H) 10/06/2016 1439   MCH 33.6 04/11/2019 1503   MCHC 30.5 04/11/2019 1503   RDW 17.4 (H) 04/11/2019 1503   RDW 16.2 (H) 10/06/2016 1439   LYMPHSABS 1.1 04/11/2019 1503   LYMPHSABS 1.0 10/06/2016 1439   MONOABS 0.5 04/11/2019 1503   MONOABS 0.5 10/06/2016 1439   EOSABS 0.3 04/11/2019 1503   EOSABS 0.1 10/06/2016 1439   BASOSABS 0.1 04/11/2019 1503   BASOSABS 0.1 10/06/2016 1439     INR 1.3 on 11/23/2018  Chest Imaging- films reviewed: CT chest with contrast 09/26/2018-enlarging multinodular goiter exerting mass-effect severe rightward deviation on the trachea and upper mediastinum.  Cardiomegaly with  enlarged pulmonary veins and groundglass opacities diffusely suggestive of pulmonary edema.  Trivial left dependent effusion. Significant left chest collateral circulation anteriorly and laterally.  Pulmonary Functions Testing Results: No flowsheet data found.   02/13/2017-echocardiogram-LV ejection fraction 60 to 65%, PAP pressure 45  ONO rec RA 03/31/17 desat <89% x 10h 39 min rec 2lpm hs  ONO on 2lpm (done by [facility] on 04/23/17) was normal  Needs to continue using her 2lpm o2 with sleep   Echocardiogram: LVEF 60 to 123456, normal diastolic function.  Dilated RV.  Normal atria.  Mild MR.     Assessment & Plan:     ICD-10-CM   1. Oral phase dysphagia  R13.11   2. Chronic respiratory failure with hypoxia (HCC)  J96.11   3. Substernal goiter  E04.9   4. Debility  R53.81     Chronic hypoxic respiratory failure-multifactorial in etiology.  I suspect she has a component of chronic aspiration of upper airway secretions. Dysphagia probably predisposes to aspiration. Previous PE.  Based on her CT scan, she likely has HFpEF.  Competently, restrictive lung disease due to neuromuscular weakness, cardiomegaly, and progressive substernal goiter.  Unfortunately I do not think any of these causes are can have easy solutions. -Continue 2 L supplemental oxygen with home monitoring of saturations. -Continue supportive care  that her daughter has been doing-chest PT, oral suctioning.  It appears that she is able to swallow her secretions currently if she is not drooling. -Adding azelastine nasal spray twice daily to help with what may be postnasal drip causing her symptoms. -Complete course of Augmentin prescribed by PCP -Needs close outpatient follow-up  Large substernal thyroid goiter causing significant airway and esophageal deviation to the right -Ongoing follow[facility]up with [facility].  Unsure if tracheostomy would be an option to bypass this mass.  She would likely need a custom trach.  Would defer to  ENT. -Mrs. Rhein would unfortunately not be a good candidate for surgical resection given the size and location of this goiter.  Deconditioning and debility -We will limit her cough strength, complicating this issue. -Agree with continued chest PT and oral suctioning.  Not discussed today: History of PE on chronic anticoagulation, chronic immobility 2/2 debility -Recommend continue use of Coumadin given the nonmodifiable risk factor of immobility.  We discussed risks associated with anticoagulation, including spontaneous bleeding.  Her daughter understands that we should periodically weigh the risks and benefits of ongoing anticoagulation.    Up-to-date covid, pneumonia, and flu vaccines.  RTC in 1 month.     Current Outpatient Medications:  .  acetaminophen (TYLENOL) 160 MG/5ML liquid, Take 480 mg by mouth every 4 (four) hours as needed for fever or pain., Disp: , Rfl:  .  amoxicillin-clavulanate (AUGMENTIN) 875-125 MG tablet, Take 1 tablet by mouth 2 (two) times daily., Disp: 20 tablet, Rfl: 0 .  Calamine-Zinc Oxide LOTN, Apply topically., Disp: , Rfl:  .  ciclopirox (PENLAC) 8 % solution, APPLY TOPICALLY AT BEDTIME OVER NAIL & SURROUNDING SKIN,APPLY DAILY OVER PREVIOUS COAT,AFTER 7 DAYS REMOVE WITH ALCOHOL AND CONTINUE CYCLE, Disp: 6.6 mL, Rfl: 5 .  diclofenac Sodium (VOLTAREN) 1 % GEL, APPLY 2 G TOPICALLY 3 (THREE) TIMES DAILY AS NEEDED., Disp: 300 g, Rfl: 0 .  hydroxyurea (HYDREA) 500 MG capsule, TAKE 1 TABLET (500 MG) DAILY EXCEPT 2 TABLETS ( 1000 MG ) ON WEDNESDAY, SATURDAY AND SUNDAY, Disp: 360 capsule, Rfl: 1 .  ipratropium-albuterol (DUONEB) 0.5-2.5 (3) MG/3ML SOLN, Take 3 mLs by nebulization every 6 (six) hours as needed (shortness of breath)., Disp: 360 mL, Rfl: 3 .  lidocaine (LIDODERM) 5 %, Place 1 patch daily onto the skin. Remove & Discard patch within 12 hours or as directed by MD (Patient taking differently: Place 1 patch onto the skin daily as needed (pain). Remove &  Discard patch within 12 hours or as directed by MD), Disp: 15 patch, Rfl: 0 .  methimazole (TAPAZOLE) 5 MG tablet, 5 mg twice week., Disp: , Rfl:  .  NONFORMULARY OR COMPOUNDED ITEM, Topical cream with equal parts diclofenac, gabapentin, lidocaine, menthol  Disp 100 gm, Disp: 1 each, Rfl: 5 .  Oral Hygiene Products (Q-CARE COVERD YANKAUER/SUCTION) MISC, Use as directed, Disp: 10 each, Rfl: 5 .  OXYGEN, 2lpm 24/7  DME- [facility], Disp: , Rfl:  .  pantoprazole sodium (PROTONIX) 40 mg/20 mL PACK, Take 20 mLs (40 mg total) by mouth 2 (two) times daily., Disp: 80 mL, Rfl: 3 .  polyethylene glycol (MIRALAX / GLYCOLAX) packet, TAKE 17G BY MOUTH TWICE A DAY (Patient taking differently: Take 17 g by mouth 2 (two) times daily as needed for mild constipation. ), Disp: 14 packet, Rfl: 1 .  pregabalin (LYRICA) 50 MG capsule, TAKE 1 CAPSULE (50 MG TOTAL) BY MOUTH 3 (THREE) TIMES DAILY., Disp: 75 capsule, Rfl: 1 .  Probiotic  Product (ALIGN) 4 MG CAPS, Take 4 mg by mouth daily. , Disp: , Rfl:  .  propranolol (INDERAL) 10 MG tablet, TAKE 0.5 TABLETS (5 MG TOTAL) BY MOUTH 2 (TWO) TIMES DAILY., Disp: 60 tablet, Rfl: 2 .  traMADol (ULTRAM) 50 MG tablet, TAKE 2 TABLETS BY MOUTH 3 TIMES A DAY AS NEEDED, Disp: 180 tablet, Rfl: 1 .  traZODone (DESYREL) 50 MG tablet, TAKE 1 TABLET (50 MG TOTAL) BY MOUTH AT BEDTIME AS NEEDED. FOR SLEEP, Disp: 90 tablet, Rfl: 1 .  warfarin (COUMADIN) 4 MG tablet, TAKE 1 TABLET DAILY EXCEPT 1/2 ON SUNDAY AND THURSDAYS OR TAKE AS DIRECTED BY ANTICOAGULATION CLINIC. 90 DAY, Disp: 90 tablet, Rfl: 1 .  azelastine (ASTELIN) 0.1 % nasal spray, Place 2 sprays into both nostrils 2 (two) times daily. Use in each nostril as directed, Disp: 30 mL, Rfl: 11   Julian Hy, DO [facility] 05/15/2019 4:55 PM

## 2019-05-16 ENCOUNTER — Other Ambulatory Visit: Payer: Self-pay | Admitting: Interventional Radiology

## 2019-05-16 ENCOUNTER — Other Ambulatory Visit (HOSPITAL_COMMUNITY): Payer: Self-pay | Admitting: Interventional Radiology

## 2019-05-16 DIAGNOSIS — R5381 Other malaise: Secondary | ICD-10-CM | POA: Diagnosis not present

## 2019-05-16 DIAGNOSIS — R1319 Other dysphagia: Secondary | ICD-10-CM

## 2019-05-16 DIAGNOSIS — I69359 Hemiplegia and hemiparesis following cerebral infarction affecting unspecified side: Secondary | ICD-10-CM | POA: Diagnosis not present

## 2019-05-16 DIAGNOSIS — L89153 Pressure ulcer of sacral region, stage 3: Secondary | ICD-10-CM | POA: Diagnosis not present

## 2019-05-16 DIAGNOSIS — E042 Nontoxic multinodular goiter: Secondary | ICD-10-CM | POA: Diagnosis not present

## 2019-05-16 DIAGNOSIS — L89323 Pressure ulcer of left buttock, stage 3: Secondary | ICD-10-CM | POA: Diagnosis not present

## 2019-05-16 DIAGNOSIS — L89154 Pressure ulcer of sacral region, stage 4: Secondary | ICD-10-CM | POA: Diagnosis not present

## 2019-05-16 DIAGNOSIS — E049 Nontoxic goiter, unspecified: Secondary | ICD-10-CM

## 2019-05-17 DIAGNOSIS — I69351 Hemiplegia and hemiparesis following cerebral infarction affecting right dominant side: Secondary | ICD-10-CM | POA: Diagnosis not present

## 2019-05-17 DIAGNOSIS — I972 Postmastectomy lymphedema syndrome: Secondary | ICD-10-CM | POA: Diagnosis not present

## 2019-05-17 DIAGNOSIS — I69320 Aphasia following cerebral infarction: Secondary | ICD-10-CM | POA: Diagnosis not present

## 2019-05-17 DIAGNOSIS — M150 Primary generalized (osteo)arthritis: Secondary | ICD-10-CM | POA: Diagnosis not present

## 2019-05-17 DIAGNOSIS — L89323 Pressure ulcer of left buttock, stage 3: Secondary | ICD-10-CM | POA: Diagnosis not present

## 2019-05-17 DIAGNOSIS — L89153 Pressure ulcer of sacral region, stage 3: Secondary | ICD-10-CM | POA: Diagnosis not present

## 2019-05-18 ENCOUNTER — Other Ambulatory Visit (HOSPITAL_COMMUNITY): Payer: Self-pay | Admitting: *Deleted

## 2019-05-18 DIAGNOSIS — I69351 Hemiplegia and hemiparesis following cerebral infarction affecting right dominant side: Secondary | ICD-10-CM | POA: Diagnosis not present

## 2019-05-18 DIAGNOSIS — R1310 Dysphagia, unspecified: Secondary | ICD-10-CM

## 2019-05-18 DIAGNOSIS — R059 Cough, unspecified: Secondary | ICD-10-CM

## 2019-05-18 DIAGNOSIS — L89153 Pressure ulcer of sacral region, stage 3: Secondary | ICD-10-CM | POA: Diagnosis not present

## 2019-05-18 DIAGNOSIS — I69320 Aphasia following cerebral infarction: Secondary | ICD-10-CM | POA: Diagnosis not present

## 2019-05-18 DIAGNOSIS — M150 Primary generalized (osteo)arthritis: Secondary | ICD-10-CM | POA: Diagnosis not present

## 2019-05-18 DIAGNOSIS — I972 Postmastectomy lymphedema syndrome: Secondary | ICD-10-CM | POA: Diagnosis not present

## 2019-05-18 DIAGNOSIS — L89323 Pressure ulcer of left buttock, stage 3: Secondary | ICD-10-CM | POA: Diagnosis not present

## 2019-05-19 DIAGNOSIS — I69320 Aphasia following cerebral infarction: Secondary | ICD-10-CM | POA: Diagnosis not present

## 2019-05-19 DIAGNOSIS — M150 Primary generalized (osteo)arthritis: Secondary | ICD-10-CM | POA: Diagnosis not present

## 2019-05-19 DIAGNOSIS — L89153 Pressure ulcer of sacral region, stage 3: Secondary | ICD-10-CM | POA: Diagnosis not present

## 2019-05-19 DIAGNOSIS — I69351 Hemiplegia and hemiparesis following cerebral infarction affecting right dominant side: Secondary | ICD-10-CM | POA: Diagnosis not present

## 2019-05-19 DIAGNOSIS — L89323 Pressure ulcer of left buttock, stage 3: Secondary | ICD-10-CM | POA: Diagnosis not present

## 2019-05-19 DIAGNOSIS — I972 Postmastectomy lymphedema syndrome: Secondary | ICD-10-CM | POA: Diagnosis not present

## 2019-05-22 DIAGNOSIS — I972 Postmastectomy lymphedema syndrome: Secondary | ICD-10-CM | POA: Diagnosis not present

## 2019-05-22 DIAGNOSIS — I69320 Aphasia following cerebral infarction: Secondary | ICD-10-CM | POA: Diagnosis not present

## 2019-05-22 DIAGNOSIS — M150 Primary generalized (osteo)arthritis: Secondary | ICD-10-CM | POA: Diagnosis not present

## 2019-05-22 DIAGNOSIS — I69351 Hemiplegia and hemiparesis following cerebral infarction affecting right dominant side: Secondary | ICD-10-CM | POA: Diagnosis not present

## 2019-05-22 DIAGNOSIS — L89153 Pressure ulcer of sacral region, stage 3: Secondary | ICD-10-CM | POA: Diagnosis not present

## 2019-05-22 DIAGNOSIS — L89323 Pressure ulcer of left buttock, stage 3: Secondary | ICD-10-CM | POA: Diagnosis not present

## 2019-05-24 DIAGNOSIS — L89323 Pressure ulcer of left buttock, stage 3: Secondary | ICD-10-CM | POA: Diagnosis not present

## 2019-05-24 DIAGNOSIS — I972 Postmastectomy lymphedema syndrome: Secondary | ICD-10-CM | POA: Diagnosis not present

## 2019-05-24 DIAGNOSIS — I69320 Aphasia following cerebral infarction: Secondary | ICD-10-CM | POA: Diagnosis not present

## 2019-05-24 DIAGNOSIS — I69351 Hemiplegia and hemiparesis following cerebral infarction affecting right dominant side: Secondary | ICD-10-CM | POA: Diagnosis not present

## 2019-05-24 DIAGNOSIS — L89153 Pressure ulcer of sacral region, stage 3: Secondary | ICD-10-CM | POA: Diagnosis not present

## 2019-05-24 DIAGNOSIS — M150 Primary generalized (osteo)arthritis: Secondary | ICD-10-CM | POA: Diagnosis not present

## 2019-05-25 ENCOUNTER — Ambulatory Visit (INDEPENDENT_AMBULATORY_CARE_PROVIDER_SITE_OTHER): Payer: Medicare Other | Admitting: General Practice

## 2019-05-25 DIAGNOSIS — Z7901 Long term (current) use of anticoagulants: Secondary | ICD-10-CM | POA: Diagnosis not present

## 2019-05-25 DIAGNOSIS — I824Z3 Acute embolism and thrombosis of unspecified deep veins of distal lower extremity, bilateral: Secondary | ICD-10-CM

## 2019-05-25 LAB — POCT INR: INR: 2.5 (ref 2.0–3.0)

## 2019-05-25 NOTE — Patient Instructions (Addendum)
Pre visit review using our clinic review tool, if applicable. No additional management support is needed unless otherwise documented below in the visit note.  Continue to take 1 tablet daily and 1/2 tablet on tuesdays and Saturdays.  Re-check in 2 weeks.  Dosing instructions given to  Lurline Idol, [place] @ [facility] [phone]

## 2019-05-25 NOTE — Progress Notes (Signed)
Agree with management.  Spyros Winch J Samanthia Howland, MD  

## 2019-05-26 ENCOUNTER — Other Ambulatory Visit: Payer: Self-pay

## 2019-05-26 ENCOUNTER — Ambulatory Visit (HOSPITAL_COMMUNITY)
Admission: RE | Admit: 2019-05-26 | Discharge: 2019-05-26 | Disposition: A | Discharge status: Home / Self Care | Payer: Medicare Other | Source: Ambulatory Visit | Attending: Interventional Radiology | Admitting: Interventional Radiology

## 2019-05-26 DIAGNOSIS — R1312 Dysphagia, oropharyngeal phase: Secondary | ICD-10-CM

## 2019-05-26 DIAGNOSIS — R059 Cough, unspecified: Secondary | ICD-10-CM

## 2019-05-26 DIAGNOSIS — R1310 Dysphagia, unspecified: Secondary | ICD-10-CM | POA: Diagnosis not present

## 2019-05-26 DIAGNOSIS — Z0389 Encounter for observation for other suspected diseases and conditions ruled out: Secondary | ICD-10-CM | POA: Diagnosis not present

## 2019-05-26 DIAGNOSIS — R05 Cough: Secondary | ICD-10-CM | POA: Diagnosis not present

## 2019-05-26 NOTE — Therapy (Signed)
Modified Barium Swallow Progress Note  Patient Details  Name: Sandy Barrett MRN: [id number] Date of Birth: [birth year]  Today's Date: 05/26/2019  Modified Barium Swallow completed.  Full report located under Chart Review in the Imaging Section.  Brief recommendations include the following:  Clinical Impression Pt presents with mild oropharyngeal dysphagia, characterized as follows:   Orally, pt exhibits decreased bolus formation with premature spillage across consistencies.   Pharyngeal swallow is characterized by swallow reflex trigger at the level of the vallecular sinus across consistencies. Post-swallow residue was noted, with increased amount seen with puree and solid textures. Liquid wash and dry swallows effectively cleared residue. Intermittent penetration was seen on thin and nectar thick liquids, with deeper penetration seen on thin liquid via straw sips. Pt was noted to take large consecutive boluses, which increases aspiration risk.   Recommend dys 2 (finely chopped soft solids) with thin liquids primarily by cup sip to minimize penetration/aspiration risk. Crushed meds in puree. Pts daughter was encouraged to adhere to reflux precautions, which were provided in written form and reviewed in detail. No further ST intervention recommended at this time. Please reconsult if needs arise.    Swallow Evaluation Recommendations  SLP Diet Recommendations: Dysphagia 3 (Mech soft) solids;Dysphagia 2 (Fine chop) solids;Thin liquid   Liquid Administration via: Cup   Medication Administration: Crushed with puree   Supervision: Patient able to self feed;Full assist for feeding   Compensations: Minimize environmental distractions;Slow rate;Small sips/bites   Postural Changes: Remain semi-upright after after feeds/meals (Comment);Seated upright at 90 degrees   Oral Care Recommendations: Oral care BID   Sandy Barrett B. Quentin Ore, [facility], [place] Speech Language Pathologist Office:  [phone] Pager: [phone]  Shonna Chock 05/26/2019,3:12 PM

## 2019-05-27 DIAGNOSIS — M150 Primary generalized (osteo)arthritis: Secondary | ICD-10-CM | POA: Diagnosis not present

## 2019-05-27 DIAGNOSIS — I69351 Hemiplegia and hemiparesis following cerebral infarction affecting right dominant side: Secondary | ICD-10-CM | POA: Diagnosis not present

## 2019-05-27 DIAGNOSIS — L89153 Pressure ulcer of sacral region, stage 3: Secondary | ICD-10-CM | POA: Diagnosis not present

## 2019-05-27 DIAGNOSIS — L89323 Pressure ulcer of left buttock, stage 3: Secondary | ICD-10-CM | POA: Diagnosis not present

## 2019-05-27 DIAGNOSIS — I972 Postmastectomy lymphedema syndrome: Secondary | ICD-10-CM | POA: Diagnosis not present

## 2019-05-27 DIAGNOSIS — I69320 Aphasia following cerebral infarction: Secondary | ICD-10-CM | POA: Diagnosis not present

## 2019-05-29 ENCOUNTER — Encounter: Payer: Self-pay | Admitting: Internal Medicine

## 2019-05-29 DIAGNOSIS — L89323 Pressure ulcer of left buttock, stage 3: Secondary | ICD-10-CM | POA: Diagnosis not present

## 2019-05-29 DIAGNOSIS — L89153 Pressure ulcer of sacral region, stage 3: Secondary | ICD-10-CM | POA: Diagnosis not present

## 2019-05-29 DIAGNOSIS — M150 Primary generalized (osteo)arthritis: Secondary | ICD-10-CM | POA: Diagnosis not present

## 2019-05-29 DIAGNOSIS — I69351 Hemiplegia and hemiparesis following cerebral infarction affecting right dominant side: Secondary | ICD-10-CM | POA: Diagnosis not present

## 2019-05-29 DIAGNOSIS — I69320 Aphasia following cerebral infarction: Secondary | ICD-10-CM | POA: Diagnosis not present

## 2019-05-29 DIAGNOSIS — I972 Postmastectomy lymphedema syndrome: Secondary | ICD-10-CM | POA: Diagnosis not present

## 2019-05-31 DIAGNOSIS — M150 Primary generalized (osteo)arthritis: Secondary | ICD-10-CM | POA: Diagnosis not present

## 2019-05-31 DIAGNOSIS — L89153 Pressure ulcer of sacral region, stage 3: Secondary | ICD-10-CM | POA: Diagnosis not present

## 2019-05-31 DIAGNOSIS — I69320 Aphasia following cerebral infarction: Secondary | ICD-10-CM | POA: Diagnosis not present

## 2019-05-31 DIAGNOSIS — I69351 Hemiplegia and hemiparesis following cerebral infarction affecting right dominant side: Secondary | ICD-10-CM | POA: Diagnosis not present

## 2019-05-31 DIAGNOSIS — I972 Postmastectomy lymphedema syndrome: Secondary | ICD-10-CM | POA: Diagnosis not present

## 2019-05-31 DIAGNOSIS — L89323 Pressure ulcer of left buttock, stage 3: Secondary | ICD-10-CM | POA: Diagnosis not present

## 2019-06-02 DIAGNOSIS — I69320 Aphasia following cerebral infarction: Secondary | ICD-10-CM | POA: Diagnosis not present

## 2019-06-02 DIAGNOSIS — I69351 Hemiplegia and hemiparesis following cerebral infarction affecting right dominant side: Secondary | ICD-10-CM | POA: Diagnosis not present

## 2019-06-02 DIAGNOSIS — M150 Primary generalized (osteo)arthritis: Secondary | ICD-10-CM | POA: Diagnosis not present

## 2019-06-02 DIAGNOSIS — I972 Postmastectomy lymphedema syndrome: Secondary | ICD-10-CM | POA: Diagnosis not present

## 2019-06-02 DIAGNOSIS — L89153 Pressure ulcer of sacral region, stage 3: Secondary | ICD-10-CM | POA: Diagnosis not present

## 2019-06-02 DIAGNOSIS — L89323 Pressure ulcer of left buttock, stage 3: Secondary | ICD-10-CM | POA: Diagnosis not present

## 2019-06-06 ENCOUNTER — Telehealth: Payer: Self-pay | Admitting: Internal Medicine

## 2019-06-06 ENCOUNTER — Ambulatory Visit (INDEPENDENT_AMBULATORY_CARE_PROVIDER_SITE_OTHER): Payer: Medicare Other | Admitting: General Practice

## 2019-06-06 DIAGNOSIS — L89153 Pressure ulcer of sacral region, stage 3: Secondary | ICD-10-CM | POA: Diagnosis not present

## 2019-06-06 DIAGNOSIS — I69320 Aphasia following cerebral infarction: Secondary | ICD-10-CM | POA: Diagnosis not present

## 2019-06-06 DIAGNOSIS — I825Y3 Chronic embolism and thrombosis of unspecified deep veins of proximal lower extremity, bilateral: Secondary | ICD-10-CM | POA: Diagnosis not present

## 2019-06-06 DIAGNOSIS — M150 Primary generalized (osteo)arthritis: Secondary | ICD-10-CM | POA: Diagnosis not present

## 2019-06-06 DIAGNOSIS — I69351 Hemiplegia and hemiparesis following cerebral infarction affecting right dominant side: Secondary | ICD-10-CM | POA: Diagnosis not present

## 2019-06-06 DIAGNOSIS — L89323 Pressure ulcer of left buttock, stage 3: Secondary | ICD-10-CM | POA: Diagnosis not present

## 2019-06-06 DIAGNOSIS — I972 Postmastectomy lymphedema syndrome: Secondary | ICD-10-CM | POA: Diagnosis not present

## 2019-06-06 DIAGNOSIS — R1311 Dysphagia, oral phase: Secondary | ICD-10-CM

## 2019-06-06 DIAGNOSIS — Z7901 Long term (current) use of anticoagulants: Secondary | ICD-10-CM

## 2019-06-06 LAB — POCT INR: INR: 2.5 (ref 2.0–3.0)

## 2019-06-06 NOTE — Progress Notes (Signed)
Agree with management.  Diksha Tagliaferro J Vivan Vanderveer, MD  

## 2019-06-06 NOTE — Telephone Encounter (Signed)
New message:    Francetta Found is calling from [facility] home [facility] to suggest the pt be referred to a dietician. She also states that [facility] doesn't provide these services. Please advise.

## 2019-06-06 NOTE — Patient Instructions (Addendum)
Pre visit review using our clinic review tool, if applicable. No additional management support is needed unless otherwise documented below in the visit note.  Continue to take 1 tablet daily and 1/2 tablet on tuesdays and Saturdays.  Re-check in 2 weeks.  Dosing instructions given to  Kathlee Nations, RN @ [facility] [phone] and to patient's daughter, [name].

## 2019-06-06 NOTE — Telephone Encounter (Signed)
Ask patient's daughter if she wants to see a nutritionist for her mom -- I can order it but it would likely need to be with one of the hospital nutritionists

## 2019-06-07 NOTE — Telephone Encounter (Signed)
Daughter is interested in the referral to see a nutritionist.

## 2019-06-08 DIAGNOSIS — I69320 Aphasia following cerebral infarction: Secondary | ICD-10-CM | POA: Diagnosis not present

## 2019-06-08 DIAGNOSIS — I69351 Hemiplegia and hemiparesis following cerebral infarction affecting right dominant side: Secondary | ICD-10-CM | POA: Diagnosis not present

## 2019-06-08 DIAGNOSIS — L89153 Pressure ulcer of sacral region, stage 3: Secondary | ICD-10-CM | POA: Diagnosis not present

## 2019-06-08 DIAGNOSIS — I972 Postmastectomy lymphedema syndrome: Secondary | ICD-10-CM | POA: Diagnosis not present

## 2019-06-08 DIAGNOSIS — L89323 Pressure ulcer of left buttock, stage 3: Secondary | ICD-10-CM | POA: Diagnosis not present

## 2019-06-08 DIAGNOSIS — M150 Primary generalized (osteo)arthritis: Secondary | ICD-10-CM | POA: Diagnosis not present

## 2019-06-09 DIAGNOSIS — L89323 Pressure ulcer of left buttock, stage 3: Secondary | ICD-10-CM | POA: Diagnosis not present

## 2019-06-09 DIAGNOSIS — L89153 Pressure ulcer of sacral region, stage 3: Secondary | ICD-10-CM | POA: Diagnosis not present

## 2019-06-09 DIAGNOSIS — M150 Primary generalized (osteo)arthritis: Secondary | ICD-10-CM | POA: Diagnosis not present

## 2019-06-09 DIAGNOSIS — I69320 Aphasia following cerebral infarction: Secondary | ICD-10-CM | POA: Diagnosis not present

## 2019-06-09 DIAGNOSIS — M810 Age-related osteoporosis without current pathological fracture: Secondary | ICD-10-CM | POA: Diagnosis not present

## 2019-06-09 DIAGNOSIS — R7303 Prediabetes: Secondary | ICD-10-CM | POA: Diagnosis not present

## 2019-06-09 DIAGNOSIS — R946 Abnormal results of thyroid function studies: Secondary | ICD-10-CM | POA: Diagnosis not present

## 2019-06-09 DIAGNOSIS — I972 Postmastectomy lymphedema syndrome: Secondary | ICD-10-CM | POA: Diagnosis not present

## 2019-06-09 DIAGNOSIS — I69351 Hemiplegia and hemiparesis following cerebral infarction affecting right dominant side: Secondary | ICD-10-CM | POA: Diagnosis not present

## 2019-06-12 DIAGNOSIS — I69320 Aphasia following cerebral infarction: Secondary | ICD-10-CM | POA: Diagnosis not present

## 2019-06-12 DIAGNOSIS — I69351 Hemiplegia and hemiparesis following cerebral infarction affecting right dominant side: Secondary | ICD-10-CM | POA: Diagnosis not present

## 2019-06-12 DIAGNOSIS — L89323 Pressure ulcer of left buttock, stage 3: Secondary | ICD-10-CM | POA: Diagnosis not present

## 2019-06-12 DIAGNOSIS — L89153 Pressure ulcer of sacral region, stage 3: Secondary | ICD-10-CM | POA: Diagnosis not present

## 2019-06-12 DIAGNOSIS — I972 Postmastectomy lymphedema syndrome: Secondary | ICD-10-CM | POA: Diagnosis not present

## 2019-06-12 DIAGNOSIS — M150 Primary generalized (osteo)arthritis: Secondary | ICD-10-CM | POA: Diagnosis not present

## 2019-06-13 DIAGNOSIS — Z5181 Encounter for therapeutic drug level monitoring: Secondary | ICD-10-CM | POA: Diagnosis not present

## 2019-06-13 DIAGNOSIS — I972 Postmastectomy lymphedema syndrome: Secondary | ICD-10-CM | POA: Diagnosis not present

## 2019-06-13 DIAGNOSIS — M150 Primary generalized (osteo)arthritis: Secondary | ICD-10-CM | POA: Diagnosis not present

## 2019-06-13 DIAGNOSIS — D473 Essential (hemorrhagic) thrombocythemia: Secondary | ICD-10-CM | POA: Diagnosis not present

## 2019-06-13 DIAGNOSIS — Z86711 Personal history of pulmonary embolism: Secondary | ICD-10-CM | POA: Diagnosis not present

## 2019-06-13 DIAGNOSIS — K589 Irritable bowel syndrome without diarrhea: Secondary | ICD-10-CM | POA: Diagnosis not present

## 2019-06-13 DIAGNOSIS — L89153 Pressure ulcer of sacral region, stage 3: Secondary | ICD-10-CM | POA: Diagnosis not present

## 2019-06-13 DIAGNOSIS — L89323 Pressure ulcer of left buttock, stage 3: Secondary | ICD-10-CM | POA: Diagnosis not present

## 2019-06-13 DIAGNOSIS — R1310 Dysphagia, unspecified: Secondary | ICD-10-CM | POA: Diagnosis not present

## 2019-06-13 DIAGNOSIS — Z7901 Long term (current) use of anticoagulants: Secondary | ICD-10-CM | POA: Diagnosis not present

## 2019-06-13 DIAGNOSIS — I69320 Aphasia following cerebral infarction: Secondary | ICD-10-CM | POA: Diagnosis not present

## 2019-06-13 DIAGNOSIS — I69351 Hemiplegia and hemiparesis following cerebral infarction affecting right dominant side: Secondary | ICD-10-CM | POA: Diagnosis not present

## 2019-06-14 DIAGNOSIS — I69320 Aphasia following cerebral infarction: Secondary | ICD-10-CM | POA: Diagnosis not present

## 2019-06-14 DIAGNOSIS — L89153 Pressure ulcer of sacral region, stage 3: Secondary | ICD-10-CM | POA: Diagnosis not present

## 2019-06-14 DIAGNOSIS — I69351 Hemiplegia and hemiparesis following cerebral infarction affecting right dominant side: Secondary | ICD-10-CM | POA: Diagnosis not present

## 2019-06-14 DIAGNOSIS — I972 Postmastectomy lymphedema syndrome: Secondary | ICD-10-CM | POA: Diagnosis not present

## 2019-06-14 DIAGNOSIS — L89323 Pressure ulcer of left buttock, stage 3: Secondary | ICD-10-CM | POA: Diagnosis not present

## 2019-06-14 DIAGNOSIS — M150 Primary generalized (osteo)arthritis: Secondary | ICD-10-CM | POA: Diagnosis not present

## 2019-06-16 DIAGNOSIS — I69320 Aphasia following cerebral infarction: Secondary | ICD-10-CM | POA: Diagnosis not present

## 2019-06-16 DIAGNOSIS — I69351 Hemiplegia and hemiparesis following cerebral infarction affecting right dominant side: Secondary | ICD-10-CM | POA: Diagnosis not present

## 2019-06-16 DIAGNOSIS — I972 Postmastectomy lymphedema syndrome: Secondary | ICD-10-CM | POA: Diagnosis not present

## 2019-06-16 DIAGNOSIS — M150 Primary generalized (osteo)arthritis: Secondary | ICD-10-CM | POA: Diagnosis not present

## 2019-06-16 DIAGNOSIS — L89323 Pressure ulcer of left buttock, stage 3: Secondary | ICD-10-CM | POA: Diagnosis not present

## 2019-06-16 DIAGNOSIS — L89153 Pressure ulcer of sacral region, stage 3: Secondary | ICD-10-CM | POA: Diagnosis not present

## 2019-06-19 ENCOUNTER — Ambulatory Visit (INDEPENDENT_AMBULATORY_CARE_PROVIDER_SITE_OTHER): Payer: Medicare Other | Admitting: General Practice

## 2019-06-19 DIAGNOSIS — Z7901 Long term (current) use of anticoagulants: Secondary | ICD-10-CM | POA: Diagnosis not present

## 2019-06-19 DIAGNOSIS — L89323 Pressure ulcer of left buttock, stage 3: Secondary | ICD-10-CM | POA: Diagnosis not present

## 2019-06-19 DIAGNOSIS — I972 Postmastectomy lymphedema syndrome: Secondary | ICD-10-CM | POA: Diagnosis not present

## 2019-06-19 DIAGNOSIS — I69351 Hemiplegia and hemiparesis following cerebral infarction affecting right dominant side: Secondary | ICD-10-CM | POA: Diagnosis not present

## 2019-06-19 DIAGNOSIS — I69320 Aphasia following cerebral infarction: Secondary | ICD-10-CM | POA: Diagnosis not present

## 2019-06-19 DIAGNOSIS — L89153 Pressure ulcer of sacral region, stage 3: Secondary | ICD-10-CM | POA: Diagnosis not present

## 2019-06-19 DIAGNOSIS — M150 Primary generalized (osteo)arthritis: Secondary | ICD-10-CM | POA: Diagnosis not present

## 2019-06-19 LAB — POCT INR: INR: 1.9 — AB (ref 2.0–3.0)

## 2019-06-19 NOTE — Patient Instructions (Signed)
Pre visit review using our clinic review tool, if applicable. No additional management support is needed unless otherwise documented below in the visit note. 

## 2019-06-20 ENCOUNTER — Telehealth: Payer: Self-pay | Admitting: Internal Medicine

## 2019-06-20 DIAGNOSIS — R82998 Other abnormal findings in urine: Secondary | ICD-10-CM

## 2019-06-20 NOTE — Telephone Encounter (Signed)
Ordered - printing

## 2019-06-20 NOTE — Telephone Encounter (Signed)
° ° °  [name] from [facility] calling to request order for  UA and Culture; due to dark, concentrated urine Please call [phone]

## 2019-06-21 DIAGNOSIS — I69351 Hemiplegia and hemiparesis following cerebral infarction affecting right dominant side: Secondary | ICD-10-CM | POA: Diagnosis not present

## 2019-06-21 DIAGNOSIS — L89153 Pressure ulcer of sacral region, stage 3: Secondary | ICD-10-CM | POA: Diagnosis not present

## 2019-06-21 DIAGNOSIS — I69320 Aphasia following cerebral infarction: Secondary | ICD-10-CM | POA: Diagnosis not present

## 2019-06-21 DIAGNOSIS — M150 Primary generalized (osteo)arthritis: Secondary | ICD-10-CM | POA: Diagnosis not present

## 2019-06-21 DIAGNOSIS — L89323 Pressure ulcer of left buttock, stage 3: Secondary | ICD-10-CM | POA: Diagnosis not present

## 2019-06-21 DIAGNOSIS — I972 Postmastectomy lymphedema syndrome: Secondary | ICD-10-CM | POA: Diagnosis not present

## 2019-06-21 NOTE — Telephone Encounter (Signed)
Called and left message for Sandy Barrett today. I left message with verbal if she was fine with taking the verbal. If she needs order faxed I asked there to leave a good fax number for me to send the order to.

## 2019-06-23 DIAGNOSIS — M150 Primary generalized (osteo)arthritis: Secondary | ICD-10-CM | POA: Diagnosis not present

## 2019-06-23 DIAGNOSIS — I69320 Aphasia following cerebral infarction: Secondary | ICD-10-CM | POA: Diagnosis not present

## 2019-06-23 DIAGNOSIS — I69351 Hemiplegia and hemiparesis following cerebral infarction affecting right dominant side: Secondary | ICD-10-CM | POA: Diagnosis not present

## 2019-06-23 DIAGNOSIS — L89323 Pressure ulcer of left buttock, stage 3: Secondary | ICD-10-CM | POA: Diagnosis not present

## 2019-06-23 DIAGNOSIS — I972 Postmastectomy lymphedema syndrome: Secondary | ICD-10-CM | POA: Diagnosis not present

## 2019-06-23 DIAGNOSIS — L89153 Pressure ulcer of sacral region, stage 3: Secondary | ICD-10-CM | POA: Diagnosis not present

## 2019-06-25 ENCOUNTER — Other Ambulatory Visit: Payer: Self-pay | Admitting: Internal Medicine

## 2019-06-26 ENCOUNTER — Other Ambulatory Visit: Payer: Self-pay | Admitting: Internal Medicine

## 2019-06-26 ENCOUNTER — Telehealth: Payer: Self-pay | Admitting: Internal Medicine

## 2019-06-26 DIAGNOSIS — I972 Postmastectomy lymphedema syndrome: Secondary | ICD-10-CM | POA: Diagnosis not present

## 2019-06-26 DIAGNOSIS — I69351 Hemiplegia and hemiparesis following cerebral infarction affecting right dominant side: Secondary | ICD-10-CM | POA: Diagnosis not present

## 2019-06-26 DIAGNOSIS — M150 Primary generalized (osteo)arthritis: Secondary | ICD-10-CM | POA: Diagnosis not present

## 2019-06-26 DIAGNOSIS — L89323 Pressure ulcer of left buttock, stage 3: Secondary | ICD-10-CM | POA: Diagnosis not present

## 2019-06-26 DIAGNOSIS — I69320 Aphasia following cerebral infarction: Secondary | ICD-10-CM | POA: Diagnosis not present

## 2019-06-26 DIAGNOSIS — L89153 Pressure ulcer of sacral region, stage 3: Secondary | ICD-10-CM | POA: Diagnosis not present

## 2019-06-26 MED ORDER — TRAMADOL HCL 50 MG PO TABS: ORAL_TABLET | ORAL | 1 refills | Status: DC

## 2019-06-26 NOTE — Telephone Encounter (Signed)
    1.Medication Requested:traMADol (ULTRAM) 50 MG tablet  2. Pharmacy (Name, Street, City):[id number] - [place], [place] [address]  3. On Med List: yes  4. Last Visit with PCP: 05/11/19  5. Next visit date with PCP: 08/21/19   Agent: Please be advised that RX refills may take up to 3 business days. We ask that you follow-up with your pharmacy.

## 2019-06-28 DIAGNOSIS — M150 Primary generalized (osteo)arthritis: Secondary | ICD-10-CM | POA: Diagnosis not present

## 2019-06-28 DIAGNOSIS — L89323 Pressure ulcer of left buttock, stage 3: Secondary | ICD-10-CM | POA: Diagnosis not present

## 2019-06-28 DIAGNOSIS — L89153 Pressure ulcer of sacral region, stage 3: Secondary | ICD-10-CM | POA: Diagnosis not present

## 2019-06-28 DIAGNOSIS — I69351 Hemiplegia and hemiparesis following cerebral infarction affecting right dominant side: Secondary | ICD-10-CM | POA: Diagnosis not present

## 2019-06-28 DIAGNOSIS — I972 Postmastectomy lymphedema syndrome: Secondary | ICD-10-CM | POA: Diagnosis not present

## 2019-06-28 DIAGNOSIS — I69320 Aphasia following cerebral infarction: Secondary | ICD-10-CM | POA: Diagnosis not present

## 2019-06-30 DIAGNOSIS — I69351 Hemiplegia and hemiparesis following cerebral infarction affecting right dominant side: Secondary | ICD-10-CM | POA: Diagnosis not present

## 2019-06-30 DIAGNOSIS — M150 Primary generalized (osteo)arthritis: Secondary | ICD-10-CM | POA: Diagnosis not present

## 2019-06-30 DIAGNOSIS — I972 Postmastectomy lymphedema syndrome: Secondary | ICD-10-CM | POA: Diagnosis not present

## 2019-06-30 DIAGNOSIS — L89153 Pressure ulcer of sacral region, stage 3: Secondary | ICD-10-CM | POA: Diagnosis not present

## 2019-06-30 DIAGNOSIS — L89323 Pressure ulcer of left buttock, stage 3: Secondary | ICD-10-CM | POA: Diagnosis not present

## 2019-06-30 DIAGNOSIS — I69320 Aphasia following cerebral infarction: Secondary | ICD-10-CM | POA: Diagnosis not present

## 2019-07-03 DIAGNOSIS — L89323 Pressure ulcer of left buttock, stage 3: Secondary | ICD-10-CM | POA: Diagnosis not present

## 2019-07-03 DIAGNOSIS — I69320 Aphasia following cerebral infarction: Secondary | ICD-10-CM | POA: Diagnosis not present

## 2019-07-03 DIAGNOSIS — L89153 Pressure ulcer of sacral region, stage 3: Secondary | ICD-10-CM | POA: Diagnosis not present

## 2019-07-03 DIAGNOSIS — I69351 Hemiplegia and hemiparesis following cerebral infarction affecting right dominant side: Secondary | ICD-10-CM | POA: Diagnosis not present

## 2019-07-03 DIAGNOSIS — I972 Postmastectomy lymphedema syndrome: Secondary | ICD-10-CM | POA: Diagnosis not present

## 2019-07-03 DIAGNOSIS — M150 Primary generalized (osteo)arthritis: Secondary | ICD-10-CM | POA: Diagnosis not present

## 2019-07-04 ENCOUNTER — Other Ambulatory Visit: Payer: Self-pay | Admitting: Internal Medicine

## 2019-07-05 DIAGNOSIS — I69320 Aphasia following cerebral infarction: Secondary | ICD-10-CM | POA: Diagnosis not present

## 2019-07-05 DIAGNOSIS — I69351 Hemiplegia and hemiparesis following cerebral infarction affecting right dominant side: Secondary | ICD-10-CM | POA: Diagnosis not present

## 2019-07-05 DIAGNOSIS — M150 Primary generalized (osteo)arthritis: Secondary | ICD-10-CM | POA: Diagnosis not present

## 2019-07-05 DIAGNOSIS — L89323 Pressure ulcer of left buttock, stage 3: Secondary | ICD-10-CM | POA: Diagnosis not present

## 2019-07-05 DIAGNOSIS — I972 Postmastectomy lymphedema syndrome: Secondary | ICD-10-CM | POA: Diagnosis not present

## 2019-07-05 DIAGNOSIS — L89153 Pressure ulcer of sacral region, stage 3: Secondary | ICD-10-CM | POA: Diagnosis not present

## 2019-07-06 ENCOUNTER — Other Ambulatory Visit: Payer: Self-pay | Admitting: Internal Medicine

## 2019-07-07 DIAGNOSIS — L89323 Pressure ulcer of left buttock, stage 3: Secondary | ICD-10-CM | POA: Diagnosis not present

## 2019-07-07 DIAGNOSIS — M150 Primary generalized (osteo)arthritis: Secondary | ICD-10-CM | POA: Diagnosis not present

## 2019-07-07 DIAGNOSIS — I69320 Aphasia following cerebral infarction: Secondary | ICD-10-CM | POA: Diagnosis not present

## 2019-07-07 DIAGNOSIS — I972 Postmastectomy lymphedema syndrome: Secondary | ICD-10-CM | POA: Diagnosis not present

## 2019-07-07 DIAGNOSIS — L89153 Pressure ulcer of sacral region, stage 3: Secondary | ICD-10-CM | POA: Diagnosis not present

## 2019-07-07 DIAGNOSIS — I69351 Hemiplegia and hemiparesis following cerebral infarction affecting right dominant side: Secondary | ICD-10-CM | POA: Diagnosis not present

## 2019-07-09 DIAGNOSIS — I972 Postmastectomy lymphedema syndrome: Secondary | ICD-10-CM | POA: Diagnosis not present

## 2019-07-09 DIAGNOSIS — L89323 Pressure ulcer of left buttock, stage 3: Secondary | ICD-10-CM | POA: Diagnosis not present

## 2019-07-09 DIAGNOSIS — L89153 Pressure ulcer of sacral region, stage 3: Secondary | ICD-10-CM | POA: Diagnosis not present

## 2019-07-09 DIAGNOSIS — I69351 Hemiplegia and hemiparesis following cerebral infarction affecting right dominant side: Secondary | ICD-10-CM | POA: Diagnosis not present

## 2019-07-09 DIAGNOSIS — I69320 Aphasia following cerebral infarction: Secondary | ICD-10-CM | POA: Diagnosis not present

## 2019-07-09 DIAGNOSIS — M150 Primary generalized (osteo)arthritis: Secondary | ICD-10-CM | POA: Diagnosis not present

## 2019-07-11 ENCOUNTER — Ambulatory Visit (INDEPENDENT_AMBULATORY_CARE_PROVIDER_SITE_OTHER): Payer: Medicare Other | Admitting: General Practice

## 2019-07-11 DIAGNOSIS — Z7901 Long term (current) use of anticoagulants: Secondary | ICD-10-CM | POA: Diagnosis not present

## 2019-07-11 DIAGNOSIS — I825Y3 Chronic embolism and thrombosis of unspecified deep veins of proximal lower extremity, bilateral: Secondary | ICD-10-CM

## 2019-07-11 LAB — POCT INR: INR: 2.9 (ref 2.0–3.0)

## 2019-07-11 NOTE — Progress Notes (Signed)
Agree with management.  Sandy Barrett J Sandy Mclaren, MD  

## 2019-07-11 NOTE — Patient Instructions (Signed)
Pre visit review using our clinic review tool, if applicable. No additional management support is needed unless otherwise documented below in the visit note.  Continue to take 1 tablet daily and 1/2 tablet on tuesdays and Saturdays.  Re-check in 2 weeks.  Dosing instructions given to  Jerrilyn Cairo, RN @ [facility] [phone] and also left instructions of patient's daughter, Theresa's VM.

## 2019-07-12 DIAGNOSIS — I69320 Aphasia following cerebral infarction: Secondary | ICD-10-CM | POA: Diagnosis not present

## 2019-07-12 DIAGNOSIS — M150 Primary generalized (osteo)arthritis: Secondary | ICD-10-CM | POA: Diagnosis not present

## 2019-07-12 DIAGNOSIS — I972 Postmastectomy lymphedema syndrome: Secondary | ICD-10-CM | POA: Diagnosis not present

## 2019-07-12 DIAGNOSIS — I69351 Hemiplegia and hemiparesis following cerebral infarction affecting right dominant side: Secondary | ICD-10-CM | POA: Diagnosis not present

## 2019-07-12 DIAGNOSIS — L89153 Pressure ulcer of sacral region, stage 3: Secondary | ICD-10-CM | POA: Diagnosis not present

## 2019-07-12 DIAGNOSIS — L89323 Pressure ulcer of left buttock, stage 3: Secondary | ICD-10-CM | POA: Diagnosis not present

## 2019-07-13 DIAGNOSIS — Z86711 Personal history of pulmonary embolism: Secondary | ICD-10-CM | POA: Diagnosis not present

## 2019-07-13 DIAGNOSIS — M150 Primary generalized (osteo)arthritis: Secondary | ICD-10-CM | POA: Diagnosis not present

## 2019-07-13 DIAGNOSIS — K589 Irritable bowel syndrome without diarrhea: Secondary | ICD-10-CM | POA: Diagnosis not present

## 2019-07-13 DIAGNOSIS — Z5181 Encounter for therapeutic drug level monitoring: Secondary | ICD-10-CM | POA: Diagnosis not present

## 2019-07-13 DIAGNOSIS — I972 Postmastectomy lymphedema syndrome: Secondary | ICD-10-CM | POA: Diagnosis not present

## 2019-07-13 DIAGNOSIS — I69351 Hemiplegia and hemiparesis following cerebral infarction affecting right dominant side: Secondary | ICD-10-CM | POA: Diagnosis not present

## 2019-07-13 DIAGNOSIS — I69320 Aphasia following cerebral infarction: Secondary | ICD-10-CM | POA: Diagnosis not present

## 2019-07-13 DIAGNOSIS — L89323 Pressure ulcer of left buttock, stage 3: Secondary | ICD-10-CM | POA: Diagnosis not present

## 2019-07-13 DIAGNOSIS — Z7901 Long term (current) use of anticoagulants: Secondary | ICD-10-CM | POA: Diagnosis not present

## 2019-07-13 DIAGNOSIS — L89153 Pressure ulcer of sacral region, stage 3: Secondary | ICD-10-CM | POA: Diagnosis not present

## 2019-07-13 DIAGNOSIS — D473 Essential (hemorrhagic) thrombocythemia: Secondary | ICD-10-CM | POA: Diagnosis not present

## 2019-07-13 DIAGNOSIS — R1310 Dysphagia, unspecified: Secondary | ICD-10-CM | POA: Diagnosis not present

## 2019-07-14 DIAGNOSIS — I972 Postmastectomy lymphedema syndrome: Secondary | ICD-10-CM | POA: Diagnosis not present

## 2019-07-14 DIAGNOSIS — I69320 Aphasia following cerebral infarction: Secondary | ICD-10-CM | POA: Diagnosis not present

## 2019-07-14 DIAGNOSIS — M150 Primary generalized (osteo)arthritis: Secondary | ICD-10-CM | POA: Diagnosis not present

## 2019-07-14 DIAGNOSIS — L89153 Pressure ulcer of sacral region, stage 3: Secondary | ICD-10-CM | POA: Diagnosis not present

## 2019-07-14 DIAGNOSIS — L89323 Pressure ulcer of left buttock, stage 3: Secondary | ICD-10-CM | POA: Diagnosis not present

## 2019-07-14 DIAGNOSIS — I69351 Hemiplegia and hemiparesis following cerebral infarction affecting right dominant side: Secondary | ICD-10-CM | POA: Diagnosis not present

## 2019-07-17 DIAGNOSIS — M150 Primary generalized (osteo)arthritis: Secondary | ICD-10-CM | POA: Diagnosis not present

## 2019-07-17 DIAGNOSIS — L89323 Pressure ulcer of left buttock, stage 3: Secondary | ICD-10-CM | POA: Diagnosis not present

## 2019-07-17 DIAGNOSIS — I972 Postmastectomy lymphedema syndrome: Secondary | ICD-10-CM | POA: Diagnosis not present

## 2019-07-17 DIAGNOSIS — I69351 Hemiplegia and hemiparesis following cerebral infarction affecting right dominant side: Secondary | ICD-10-CM | POA: Diagnosis not present

## 2019-07-17 DIAGNOSIS — I69320 Aphasia following cerebral infarction: Secondary | ICD-10-CM | POA: Diagnosis not present

## 2019-07-17 DIAGNOSIS — L89153 Pressure ulcer of sacral region, stage 3: Secondary | ICD-10-CM | POA: Diagnosis not present

## 2019-07-18 DIAGNOSIS — L89154 Pressure ulcer of sacral region, stage 4: Secondary | ICD-10-CM | POA: Diagnosis not present

## 2019-07-18 DIAGNOSIS — L89323 Pressure ulcer of left buttock, stage 3: Secondary | ICD-10-CM | POA: Diagnosis not present

## 2019-07-18 DIAGNOSIS — E042 Nontoxic multinodular goiter: Secondary | ICD-10-CM | POA: Diagnosis not present

## 2019-07-18 DIAGNOSIS — I69359 Hemiplegia and hemiparesis following cerebral infarction affecting unspecified side: Secondary | ICD-10-CM | POA: Diagnosis not present

## 2019-07-19 ENCOUNTER — Other Ambulatory Visit: Payer: Self-pay

## 2019-07-19 ENCOUNTER — Ambulatory Visit (INDEPENDENT_AMBULATORY_CARE_PROVIDER_SITE_OTHER): Payer: Medicare Other | Admitting: Podiatry

## 2019-07-19 ENCOUNTER — Encounter: Payer: Self-pay | Admitting: Podiatry

## 2019-07-19 DIAGNOSIS — D689 Coagulation defect, unspecified: Secondary | ICD-10-CM

## 2019-07-19 DIAGNOSIS — B351 Tinea unguium: Secondary | ICD-10-CM | POA: Diagnosis not present

## 2019-07-19 DIAGNOSIS — M79675 Pain in left toe(s): Secondary | ICD-10-CM | POA: Diagnosis not present

## 2019-07-19 DIAGNOSIS — M79674 Pain in right toe(s): Secondary | ICD-10-CM | POA: Diagnosis not present

## 2019-07-19 DIAGNOSIS — Z993 Dependence on wheelchair: Secondary | ICD-10-CM | POA: Diagnosis not present

## 2019-07-19 NOTE — Progress Notes (Signed)
This patient returns to my office for at risk foot care.  This patient requires this care by a professional since this patient will be at risk due to having venous congestion and coagulation defect.  Patient is taking coumadin. Patient presents to the office today in a wheelchair and with her daughter.  This patient is unable to cut nails herself since the patient cannot reach her nails.These nails are painful walking and wearing shoes.  This patient presents for at risk foot care today.  General Appearance  Alert, conversant and in no acute stress.  Vascular  Dorsalis pedis and posterior tibial  pulses are palpable  bilaterally.  Capillary return is within normal limits  bilaterally. Temperature is within normal limits  bilaterally.  Neurologic  Deferred since the patient is unable to communicate.    Nails Thick disfigured discolored nails with subungual debris  from hallux to fifth toes bilaterally. No evidence of bacterial infection or drainage bilaterally.  Orthopedic  No limitations of motion  feet .  No crepitus or effusions noted.  No bony pathology or digital deformities noted.  Skin  normotropic skin with no porokeratosis noted bilaterally.  No signs of infections or ulcers noted.     Onychomycosis  Pain in right toes  Pain in left toes  Consent was obtained for treatment procedures.   Mechanical debridement of nails 1-5  bilaterally performed with a nail nipper.  Filed with dremel without incident.    Return office visit  3 months                    Told patient to return for periodic foot care and evaluation due to potential at risk complications.   Jacarie Pate DPM  

## 2019-07-24 ENCOUNTER — Telehealth: Payer: Self-pay | Admitting: Internal Medicine

## 2019-07-24 ENCOUNTER — Ambulatory Visit (INDEPENDENT_AMBULATORY_CARE_PROVIDER_SITE_OTHER): Payer: Medicare Other | Admitting: General Practice

## 2019-07-24 DIAGNOSIS — I69320 Aphasia following cerebral infarction: Secondary | ICD-10-CM | POA: Diagnosis not present

## 2019-07-24 DIAGNOSIS — I69351 Hemiplegia and hemiparesis following cerebral infarction affecting right dominant side: Secondary | ICD-10-CM | POA: Diagnosis not present

## 2019-07-24 DIAGNOSIS — M150 Primary generalized (osteo)arthritis: Secondary | ICD-10-CM | POA: Diagnosis not present

## 2019-07-24 DIAGNOSIS — L89153 Pressure ulcer of sacral region, stage 3: Secondary | ICD-10-CM | POA: Diagnosis not present

## 2019-07-24 DIAGNOSIS — Z7901 Long term (current) use of anticoagulants: Secondary | ICD-10-CM

## 2019-07-24 DIAGNOSIS — I972 Postmastectomy lymphedema syndrome: Secondary | ICD-10-CM | POA: Diagnosis not present

## 2019-07-24 DIAGNOSIS — L89323 Pressure ulcer of left buttock, stage 3: Secondary | ICD-10-CM | POA: Diagnosis not present

## 2019-07-24 LAB — POCT INR: INR: 2.7 (ref 2.0–3.0)

## 2019-07-24 NOTE — Telephone Encounter (Signed)
Okay to give orders as requested;  

## 2019-07-24 NOTE — Telephone Encounter (Signed)
New Message:   Sandy Barrett is calling from [facility] and states the pt's daughter is very concerned that the pt is having hallucinations at night. She also states there is a very strong odor coming from the pt's urine and they are requesting an order to do a Urine Sample. She states a UACNS. Please advise.

## 2019-07-24 NOTE — Patient Instructions (Signed)
Pre visit review using our clinic review tool, if applicable. No additional management support is needed unless otherwise documented below in the visit note.  Continue to take 1 tablet daily and 1/2 tablet on tuesdays and Saturdays.  Re-check in 2 weeks.  Dosing instructions given to  Jerrilyn Cairo, RN @ [facility] [phone] and also left instructions of patient's daughter, Theresa's VM.

## 2019-07-25 ENCOUNTER — Encounter: Payer: Self-pay | Admitting: Internal Medicine

## 2019-07-25 DIAGNOSIS — R3989 Other symptoms and signs involving the genitourinary system: Secondary | ICD-10-CM

## 2019-07-25 NOTE — Telephone Encounter (Signed)
Verbal order and message left for Sandy Barrett today on her voicemail.

## 2019-07-26 ENCOUNTER — Other Ambulatory Visit: Payer: Self-pay

## 2019-07-26 DIAGNOSIS — R6889 Other general symptoms and signs: Secondary | ICD-10-CM | POA: Diagnosis not present

## 2019-07-26 DIAGNOSIS — R7982 Elevated C-reactive protein (CRP): Secondary | ICD-10-CM | POA: Diagnosis not present

## 2019-07-26 DIAGNOSIS — R700 Elevated erythrocyte sedimentation rate: Secondary | ICD-10-CM | POA: Diagnosis not present

## 2019-07-26 DIAGNOSIS — L89153 Pressure ulcer of sacral region, stage 3: Secondary | ICD-10-CM | POA: Diagnosis not present

## 2019-07-26 DIAGNOSIS — M150 Primary generalized (osteo)arthritis: Secondary | ICD-10-CM | POA: Diagnosis not present

## 2019-07-26 DIAGNOSIS — I69351 Hemiplegia and hemiparesis following cerebral infarction affecting right dominant side: Secondary | ICD-10-CM | POA: Diagnosis not present

## 2019-07-26 DIAGNOSIS — R946 Abnormal results of thyroid function studies: Secondary | ICD-10-CM | POA: Diagnosis not present

## 2019-07-26 DIAGNOSIS — L89323 Pressure ulcer of left buttock, stage 3: Secondary | ICD-10-CM | POA: Diagnosis not present

## 2019-07-26 DIAGNOSIS — Z13228 Encounter for screening for other metabolic disorders: Secondary | ICD-10-CM | POA: Diagnosis not present

## 2019-07-26 DIAGNOSIS — I69320 Aphasia following cerebral infarction: Secondary | ICD-10-CM | POA: Diagnosis not present

## 2019-07-26 DIAGNOSIS — I972 Postmastectomy lymphedema syndrome: Secondary | ICD-10-CM | POA: Diagnosis not present

## 2019-07-26 DIAGNOSIS — R770 Abnormality of albumin: Secondary | ICD-10-CM | POA: Diagnosis not present

## 2019-07-26 MED ORDER — DICLOFENAC SODIUM 1 % EX GEL: CUTANEOUS | 0 refills | Status: DC

## 2019-07-27 ENCOUNTER — Other Ambulatory Visit: Payer: Self-pay | Admitting: Internal Medicine

## 2019-07-27 DIAGNOSIS — N390 Urinary tract infection, site not specified: Secondary | ICD-10-CM

## 2019-07-27 NOTE — Telephone Encounter (Signed)
MD is out of the office pls advise on email.Marland KitchenJohny Barrett

## 2019-07-28 DIAGNOSIS — L89323 Pressure ulcer of left buttock, stage 3: Secondary | ICD-10-CM | POA: Diagnosis not present

## 2019-07-28 DIAGNOSIS — L89153 Pressure ulcer of sacral region, stage 3: Secondary | ICD-10-CM | POA: Diagnosis not present

## 2019-07-28 DIAGNOSIS — I69320 Aphasia following cerebral infarction: Secondary | ICD-10-CM | POA: Diagnosis not present

## 2019-07-28 DIAGNOSIS — I69351 Hemiplegia and hemiparesis following cerebral infarction affecting right dominant side: Secondary | ICD-10-CM | POA: Diagnosis not present

## 2019-07-28 DIAGNOSIS — M150 Primary generalized (osteo)arthritis: Secondary | ICD-10-CM | POA: Diagnosis not present

## 2019-07-28 DIAGNOSIS — I972 Postmastectomy lymphedema syndrome: Secondary | ICD-10-CM | POA: Diagnosis not present

## 2019-07-31 ENCOUNTER — Telehealth: Payer: Self-pay

## 2019-07-31 DIAGNOSIS — I69320 Aphasia following cerebral infarction: Secondary | ICD-10-CM | POA: Diagnosis not present

## 2019-07-31 DIAGNOSIS — R1310 Dysphagia, unspecified: Secondary | ICD-10-CM

## 2019-07-31 DIAGNOSIS — Z86711 Personal history of pulmonary embolism: Secondary | ICD-10-CM | POA: Diagnosis not present

## 2019-07-31 DIAGNOSIS — K589 Irritable bowel syndrome without diarrhea: Secondary | ICD-10-CM | POA: Diagnosis not present

## 2019-07-31 DIAGNOSIS — Z7901 Long term (current) use of anticoagulants: Secondary | ICD-10-CM | POA: Diagnosis not present

## 2019-07-31 DIAGNOSIS — L89323 Pressure ulcer of left buttock, stage 3: Secondary | ICD-10-CM | POA: Diagnosis not present

## 2019-07-31 DIAGNOSIS — M150 Primary generalized (osteo)arthritis: Secondary | ICD-10-CM | POA: Diagnosis not present

## 2019-07-31 DIAGNOSIS — I972 Postmastectomy lymphedema syndrome: Secondary | ICD-10-CM

## 2019-07-31 DIAGNOSIS — I69351 Hemiplegia and hemiparesis following cerebral infarction affecting right dominant side: Secondary | ICD-10-CM | POA: Diagnosis not present

## 2019-07-31 DIAGNOSIS — D473 Essential (hemorrhagic) thrombocythemia: Secondary | ICD-10-CM | POA: Diagnosis not present

## 2019-07-31 DIAGNOSIS — L89153 Pressure ulcer of sacral region, stage 3: Secondary | ICD-10-CM | POA: Diagnosis not present

## 2019-08-02 DIAGNOSIS — I972 Postmastectomy lymphedema syndrome: Secondary | ICD-10-CM | POA: Diagnosis not present

## 2019-08-02 DIAGNOSIS — L89323 Pressure ulcer of left buttock, stage 3: Secondary | ICD-10-CM | POA: Diagnosis not present

## 2019-08-02 DIAGNOSIS — L89153 Pressure ulcer of sacral region, stage 3: Secondary | ICD-10-CM | POA: Diagnosis not present

## 2019-08-02 DIAGNOSIS — I69351 Hemiplegia and hemiparesis following cerebral infarction affecting right dominant side: Secondary | ICD-10-CM | POA: Diagnosis not present

## 2019-08-02 DIAGNOSIS — I69320 Aphasia following cerebral infarction: Secondary | ICD-10-CM | POA: Diagnosis not present

## 2019-08-02 DIAGNOSIS — M150 Primary generalized (osteo)arthritis: Secondary | ICD-10-CM | POA: Diagnosis not present

## 2019-08-03 ENCOUNTER — Telehealth: Payer: Self-pay

## 2019-08-03 NOTE — Telephone Encounter (Signed)
Verbal given 

## 2019-08-03 NOTE — Telephone Encounter (Signed)
Ok for verbal 

## 2019-08-03 NOTE — Telephone Encounter (Signed)
yes

## 2019-08-03 NOTE — Telephone Encounter (Signed)
New message    [facility] needs a verbal order for in/out cath due to the patient being unable to get a clean catch urine specimen   Home health visit on 7.30.21

## 2019-08-04 DIAGNOSIS — I69320 Aphasia following cerebral infarction: Secondary | ICD-10-CM | POA: Diagnosis not present

## 2019-08-04 DIAGNOSIS — M150 Primary generalized (osteo)arthritis: Secondary | ICD-10-CM | POA: Diagnosis not present

## 2019-08-04 DIAGNOSIS — I69351 Hemiplegia and hemiparesis following cerebral infarction affecting right dominant side: Secondary | ICD-10-CM | POA: Diagnosis not present

## 2019-08-04 DIAGNOSIS — L89153 Pressure ulcer of sacral region, stage 3: Secondary | ICD-10-CM | POA: Diagnosis not present

## 2019-08-04 DIAGNOSIS — L89323 Pressure ulcer of left buttock, stage 3: Secondary | ICD-10-CM | POA: Diagnosis not present

## 2019-08-04 DIAGNOSIS — I972 Postmastectomy lymphedema syndrome: Secondary | ICD-10-CM | POA: Diagnosis not present

## 2019-08-07 ENCOUNTER — Ambulatory Visit (INDEPENDENT_AMBULATORY_CARE_PROVIDER_SITE_OTHER): Payer: Medicare Other | Admitting: General Practice

## 2019-08-07 ENCOUNTER — Ambulatory Visit: Payer: Self-pay | Admitting: General Practice

## 2019-08-07 ENCOUNTER — Telehealth: Payer: Self-pay | Admitting: Internal Medicine

## 2019-08-07 DIAGNOSIS — Z7901 Long term (current) use of anticoagulants: Secondary | ICD-10-CM | POA: Diagnosis not present

## 2019-08-07 DIAGNOSIS — L89323 Pressure ulcer of left buttock, stage 3: Secondary | ICD-10-CM | POA: Diagnosis not present

## 2019-08-07 DIAGNOSIS — I69351 Hemiplegia and hemiparesis following cerebral infarction affecting right dominant side: Secondary | ICD-10-CM | POA: Diagnosis not present

## 2019-08-07 DIAGNOSIS — M150 Primary generalized (osteo)arthritis: Secondary | ICD-10-CM | POA: Diagnosis not present

## 2019-08-07 DIAGNOSIS — L89153 Pressure ulcer of sacral region, stage 3: Secondary | ICD-10-CM | POA: Diagnosis not present

## 2019-08-07 DIAGNOSIS — I972 Postmastectomy lymphedema syndrome: Secondary | ICD-10-CM | POA: Diagnosis not present

## 2019-08-07 DIAGNOSIS — I69320 Aphasia following cerebral infarction: Secondary | ICD-10-CM | POA: Diagnosis not present

## 2019-08-07 LAB — POCT INR: INR: 2.2 (ref 2.0–3.0)

## 2019-08-07 NOTE — Patient Instructions (Signed)
Pre visit review using our clinic review tool, if applicable. No additional management support is needed unless otherwise documented below in the visit note.  Continue to take 1 tablet daily and 1/2 tablet on tuesdays and Saturdays.  Re-check in 2 weeks.  Dosing instructions given to  [name], RN @ [facility] [phone] and also left instructions with patient's daughter, [name].

## 2019-08-07 NOTE — Telephone Encounter (Signed)
Noted  

## 2019-08-07 NOTE — Telephone Encounter (Signed)
    Tracey from [facility] calling to report no urine sample collected today. Patients daughter felt it was not needed, urine no longer concentrated

## 2019-08-08 NOTE — Patient Instructions (Signed)
Opened in error

## 2019-08-09 DIAGNOSIS — L89323 Pressure ulcer of left buttock, stage 3: Secondary | ICD-10-CM | POA: Diagnosis not present

## 2019-08-09 DIAGNOSIS — M150 Primary generalized (osteo)arthritis: Secondary | ICD-10-CM | POA: Diagnosis not present

## 2019-08-09 DIAGNOSIS — I69351 Hemiplegia and hemiparesis following cerebral infarction affecting right dominant side: Secondary | ICD-10-CM | POA: Diagnosis not present

## 2019-08-09 DIAGNOSIS — I972 Postmastectomy lymphedema syndrome: Secondary | ICD-10-CM | POA: Diagnosis not present

## 2019-08-09 DIAGNOSIS — I69320 Aphasia following cerebral infarction: Secondary | ICD-10-CM | POA: Diagnosis not present

## 2019-08-09 DIAGNOSIS — L89153 Pressure ulcer of sacral region, stage 3: Secondary | ICD-10-CM | POA: Diagnosis not present

## 2019-08-10 ENCOUNTER — Ambulatory Visit (INDEPENDENT_AMBULATORY_CARE_PROVIDER_SITE_OTHER): Payer: Medicare Other | Admitting: Pulmonary Disease

## 2019-08-10 ENCOUNTER — Other Ambulatory Visit: Payer: Self-pay

## 2019-08-10 ENCOUNTER — Encounter: Payer: Self-pay | Admitting: Pulmonary Disease

## 2019-08-10 DIAGNOSIS — J690 Pneumonitis due to inhalation of food and vomit: Secondary | ICD-10-CM

## 2019-08-10 DIAGNOSIS — J9611 Chronic respiratory failure with hypoxia: Secondary | ICD-10-CM | POA: Diagnosis not present

## 2019-08-10 NOTE — Progress Notes (Signed)
@Patient  ID: Sandy Barrett, female    DOB: [birth year], 86 y.o.   MRN: [id number]  Chief Complaint  Patient presents with  . Follow-up    pt is here for said couple of low oxygen levels but wears oxygen 2 liters    Referring provider: [name], MD  HPI:  86 year old female never smoker followed in our office for chronic respiratory failure, nocturnal hypoxemia.  Patient initially consulted with our practice after a case of aspiration pneumonia in 2019.  PMH:  hypothyroidism, IBS, wheelchair-bound, history of PE Smoker/ Smoking History: Never smoker Maintenance: None Pt of: Dr. Carlis Abbott  08/10/2019  - Visit   86 year old female never smoker followed in our office for chronic respiratory failure.  Patient has been doing okay since last being seen back in May/2021.  At that point time patient was being treated with Augmentin from primary care.  Patient's caregiver reporting that she had struggled with some issues with regurgitation about a month ago with a cough.  Her Protonix was increased.  This is helped significantly.  Patient with no acute respiratory complaints at this point in time.  Questionaires / Pulmonary Flowsheets:   ACT:  No flowsheet data found.  MMRC: No flowsheet data found.  Epworth:  No flowsheet data found.  Tests:   Chest Imaging: CT chest with contrast 09/26/2018-enlarging multinodular goiter exerting mass-effect severe rightward deviation on the trachea and upper mediastinum.  Cardiomegaly with enlarged pulmonary veins and groundglass opacities diffusely suggestive of pulmonary edema.  Trivial left dependent effusion. Significant left chest collateral circulation anteriorly and laterally.  Pulmonary Functions Testing Results: No flowsheet data found.   02/13/2017-echocardiogram-LV ejection fraction 60 to 65%, PAP pressure 45  ONO rec RA 03/31/17 desat <89% x 10h 39 min rec 2lpm hs  ONO on 2lpm (done by [facility] on 04/23/17) was normal  Needs to  continue using her 2lpm o2 with sleep   Echocardiogram: LVEF 60 to 27%, normal diastolic function.  Dilated RV.  Normal atria.  Mild MR.   FENO:  No results found for: NITRICOXIDE  PFT: No flowsheet data found.  WALK:  No flowsheet data found.  Imaging: No results found.  Lab Results:  CBC    Component Value Date/Time   WBC 14.7 (H) 04/11/2019 1503   RBC 3.48 (L) 04/11/2019 1503   HGB 11.7 (L) 04/11/2019 1503   HGB 14.1 10/06/2016 1439   HCT 38.3 04/11/2019 1503   HCT 43.5 10/06/2016 1439   PLT 716 (H) 04/11/2019 1503   PLT 259 Clumped Platelets--Appears Adequate 10/06/2016 1439   MCV 110.1 (H) 04/11/2019 1503   MCV 114.2 (H) 10/06/2016 1439   MCH 33.6 04/11/2019 1503   MCHC 30.5 04/11/2019 1503   RDW 17.4 (H) 04/11/2019 1503   RDW 16.2 (H) 10/06/2016 1439   LYMPHSABS 1.1 04/11/2019 1503   LYMPHSABS 1.0 10/06/2016 1439   MONOABS 0.5 04/11/2019 1503   MONOABS 0.5 10/06/2016 1439   EOSABS 0.3 04/11/2019 1503   EOSABS 0.1 10/06/2016 1439   BASOSABS 0.1 04/11/2019 1503   BASOSABS 0.1 10/06/2016 1439    BMET    Component Value Date/Time   NA 141 04/11/2019 1503   NA 142 10/06/2016 1352   K 3.8 04/11/2019 1503   K 4.1 10/06/2016 1352   CL 107 04/11/2019 1503   CO2 27 04/11/2019 1503   CO2 27 10/06/2016 1352   GLUCOSE 102 (H) 04/11/2019 1503   GLUCOSE 90 10/06/2016 1352   BUN 21 04/11/2019 1503  BUN 12.9 10/06/2016 1352   CREATININE 0.54 04/11/2019 1503   CREATININE 0.6 10/06/2016 1352   CALCIUM 8.8 (L) 04/11/2019 1503   CALCIUM 9.4 10/06/2016 1352   GFRNONAA >60 04/11/2019 1503   GFRAA >60 04/11/2019 1503    BNP    Component Value Date/Time   BNP 899.5 (H) 02/12/2017 1531    ProBNP    Component Value Date/Time   PROBNP 69.0 05/24/2017 1625    Specialty Problems      Pulmonary Problems   Tracheal deviation    CT 05/2015 shows trachea deviation from multinodular goiter.       Aspiration pneumonia ([place])   Hemidiaphragm paralysis    Chronic respiratory failure with hypoxia (HCC)    sats 97% RA at rest 03/11/2017 p admit for asp pna 02/12/17  - ONO rec RA  03/31/17  desat < 89% x 10h 39 min rec 2lpm hs and repeat ono on 2lpm > done 04/24/17 ok   09/04/2018- overnight oximetry on room air- duration 9 hours and 58 minutes, oxygen levels below 88% for 2 hours and 16 minutes      [place]      Allergies  Allergen Reactions  . Valtrex [Valacyclovir Hcl] Cough    Cough and gagging    Immunization History  Administered Date(s) Administered  . Fluad Quad(high Dose 65+) 10/18/2018  . Influenza Split 10/28/2010  . Influenza, High Dose Seasonal PF 11/26/2014, 09/09/2016  . Influenza, Seasonal, Injecte, Preservative Fre 12/24/2011  . Influenza,inj,Quad PF,6+ Mos 08/31/2012, 10/25/2015  . Influenza-Unspecified 08/31/2012, 10/25/2015  . PFIZER SARS-COV-2 Vaccination 02/16/2019, 03/13/2019  . Pneumococcal Conjugate-13 11/26/2014  . Pneumococcal Polysaccharide-23 07/22/2010  . Tdap 07/22/2010    Past Medical History:  Diagnosis Date  . ARTHRITIS   . Arthritis   . Diverticulosis   . Essential thrombocytosis ([place])   . HYPERTENSION   . HYPERTHYROIDISM   . Hyperthyroidism    s/p I-131 ablation 03/2011 of multinod goiter  . INSOMNIA   . MIGRAINE HEADACHE   . OBESITY   . Posttraumatic stress disorder   . Pulmonary embolism ([place]) 03/2014   with DVT  . Stroke [facility]) 03/2014   dysarthria    Tobacco History: Social History   Tobacco Use  Smoking Status Never Smoker  Smokeless Tobacco Never Used   Counseling given: Yes   Continue to not smoke  Outpatient Encounter Medications as of 08/10/2019  Medication Sig  . acetaminophen (TYLENOL) 160 MG/5ML liquid Take 480 mg by mouth every 4 (four) hours as needed for fever or pain.  [name] azelastine (ASTELIN) 0.1 % nasal spray Place 2 sprays into both nostrils 2 (two) times daily. Use in each nostril as directed  . ciclopirox (PENLAC) 8 % solution APPLY TOPICALLY AT BEDTIME OVER NAIL  & SURROUNDING SKIN,APPLY DAILY OVER PREVIOUS COAT,AFTER 7 DAYS REMOVE WITH ALCOHOL AND CONTINUE CYCLE  . diclofenac Sodium (VOLTAREN) 1 % GEL APPLY 2 G TOPICALLY 3 (THREE) TIMES DAILY AS NEEDED.  . hydroxyurea (HYDREA) 500 MG capsule TAKE 1 TABLET (500 MG) DAILY EXCEPT 2 TABLETS ( 1000 MG ) ON WEDNESDAY, SATURDAY AND SUNDAY  . ipratropium-albuterol (DUONEB) 0.5-2.5 (3) MG/3ML SOLN Take 3 mLs by nebulization every 6 (six) hours as needed (shortness of breath).  . lidocaine (LIDODERM) 5 % Place 1 patch daily onto the skin. Remove & Discard patch within 12 hours or as directed by MD (Patient taking differently: Place 1 patch onto the skin daily as needed (pain). Remove & Discard patch within 12 hours or  as directed by MD)  . NONFORMULARY OR COMPOUNDED ITEM Topical cream with equal parts diclofenac, gabapentin, lidocaine, menthol  Disp 100 gm  . Oral Hygiene Products (Q-CARE COVERD YANKAUER/SUCTION) MISC Use as directed  . OXYGEN 2lpm 24/7  DME- [facility]  . polyethylene glycol (MIRALAX / GLYCOLAX) packet TAKE 17G BY MOUTH TWICE A DAY (Patient taking differently: Take 17 g by mouth 2 (two) times daily as needed for mild constipation. As needed)  . pregabalin (LYRICA) 50 MG capsule TAKE 1 CAPSULE BY MOUTH THREE TIMES A DAY  . Probiotic Product (ALIGN) 4 MG CAPS Take 4 mg by mouth daily.   . propranolol (INDERAL) 10 MG tablet TAKE 0.5 TABLETS (5 MG TOTAL) BY MOUTH 2 (TWO) TIMES DAILY.  [name] PROTONIX 40 MG PACK TAKE 20 MLS (40 MG TOTAL) BY MOUTH 2 (TWO) TIMES DAILY.  . traMADol (ULTRAM) 50 MG tablet TAKE 2 TABLETS BY MOUTH 3 TIMES A DAY AS NEEDED  . traZODone (DESYREL) 50 MG tablet TAKE 1 TABLET (50 MG TOTAL) BY MOUTH AT BEDTIME AS NEEDED. FOR SLEEP  . warfarin (COUMADIN) 4 MG tablet TAKE 1 TABLET DAILY EXCEPT 1/2 ON SUNDAY AND THURSDAYS OR TAKE AS DIRECTED BY ANTICOAGULATION CLINIC  . [DISCONTINUED] amoxicillin-clavulanate (AUGMENTIN) 875-125 MG tablet Take 1 tablet by mouth 2 (two) times daily.  .  [DISCONTINUED] Calamine-Zinc Oxide LOTN Apply topically.  . [DISCONTINUED] methimazole (TAPAZOLE) 5 MG tablet 5 mg twice week.   No facility-administered encounter medications on file as of 08/10/2019.     Review of Systems  Review of Systems  Constitutional: Negative for activity change, fatigue and fever.  HENT: Positive for trouble swallowing. Negative for sinus pressure, sinus pain and sore throat.   Respiratory: Positive for shortness of breath. Negative for cough and wheezing.   Cardiovascular: Negative for chest pain and palpitations.  Gastrointestinal: Negative for diarrhea, nausea and vomiting.  Musculoskeletal: Negative for arthralgias.       Wheelchair-bound  Neurological: Negative for dizziness.  Psychiatric/Behavioral: Negative for sleep disturbance. The patient is not nervous/anxious.      Physical Exam  BP 128/78 (BP Location: Left Arm, Cuff Size: Normal)   Pulse 80   Temp 98 F (36.7 C)   Ht 5\' 4"  (1.626 m)   Wt 176 lb (79.8 kg)   SpO2 91%   BMI 30.21 kg/m   Wt Readings from Last 5 Encounters:  08/10/19 176 lb (79.8 kg)  11/23/18 176 lb (79.8 kg)  08/30/18 176 lb (79.8 kg)  05/24/18 175 lb (79.4 kg)  01/24/18 178 lb (80.7 kg)    BMI Readings from Last 5 Encounters:  08/10/19 30.21 kg/m  05/11/19 34.37 kg/m  11/23/18 34.37 kg/m  10/25/18 34.37 kg/m  10/18/18 34.37 kg/m     Physical Exam Vitals and nursing note reviewed.  Constitutional:      General: She is not in acute distress.    Appearance: Normal appearance. She is normal weight.     Comments: Chronically ill elderly female  HENT:     Head: Normocephalic and atraumatic.     Right Ear: External ear normal.     Left Ear: External ear normal.     Nose: Nose normal. No congestion.     Mouth/Throat:     Mouth: Mucous membranes are moist.     Pharynx: Oropharynx is clear.  Eyes:     Pupils: Pupils are equal, round, and reactive to light.  Cardiovascular:     Rate and Rhythm: Normal  rate and regular  rhythm.     Pulses: Normal pulses.     Heart sounds: Normal heart sounds. No murmur heard.   Pulmonary:     Effort: No respiratory distress.     Breath sounds: No decreased air movement. No decreased breath sounds, wheezing or rales.     Comments: Diminished breath sounds throughout exam Musculoskeletal:     Cervical back: Normal range of motion.  Skin:    General: Skin is warm and dry.     Capillary Refill: Capillary refill takes less than 2 seconds.  Neurological:     General: No focal deficit present.     Mental Status: She is alert and oriented to person, place, and time. Mental status is at baseline.     Motor: Weakness (Wheelchair-bound) present.     Gait: Gait normal.  Psychiatric:        Mood and Affect: Mood normal.        Behavior: Behavior normal.        Thought Content: Thought content normal.        Judgment: Judgment normal.       Assessment & Plan:   Chronic respiratory failure with hypoxia (HCC) Plan: Continue oxygen therapy as prescribed Follow-up with our office in 3 months Notify our office if you have worsening cough, congestion, increased oxygen needs  Aspiration pneumonia ([place]) History of aspiration pneumonia  Plan: We will continue to clinically monitor Continue to follow dysphagia 2 diet Continue Protonix If symptoms worsen please contact our office so we can evaluate you as well as perform a chest x-ray    Return in about 3 months (around 11/10/2019), or if symptoms worsen or fail to improve, for Follow up with Dr. Carlis Abbott.   Lauraine Rinne, NP 08/10/2019   This appointment required 32 minutes of patient care (this includes precharting, chart review, review of results, face-to-face care, etc.).

## 2019-08-10 NOTE — Assessment & Plan Note (Signed)
Plan: Continue oxygen therapy as prescribed Follow-up with our office in 3 months Notify our office if you have worsening cough, congestion, increased oxygen needs

## 2019-08-10 NOTE — Assessment & Plan Note (Signed)
History of aspiration pneumonia  Plan: We will continue to clinically monitor Continue to follow dysphagia 2 diet Continue Protonix If symptoms worsen please contact our office so we can evaluate you as well as perform a chest x-ray

## 2019-08-10 NOTE — Patient Instructions (Addendum)
You were seen today by Lauraine Rinne, NP  for:   1. Chronic respiratory failure with hypoxia (HCC)  Continue oxygen therapy as prescribed  >>>maintain oxygen saturations greater than 88 percent  >>>if unable to maintain oxygen saturations please contact the office  >>>do not smoke with oxygen  >>>can use nasal saline gel or nasal saline rinses to moisturize nose if oxygen causes dryness    Continue outpatient follow-up with [facility] Continue follow-up with primary care Continue follow-up with ear nose and throat   Follow Up:    Return in about 3 months (around 11/10/2019), or if symptoms worsen or fail to improve, for Follow up with Dr. Carlis Abbott.   Please do your part to reduce the spread of COVID-19:      Reduce your risk of any infection  and COVID19 by using the similar precautions used for avoiding the common cold or flu:  [name] Wash your hands often with soap and warm water for at least 20 seconds.  If soap and water are not readily available, use an alcohol-based hand sanitizer with at least 60% alcohol.  . If coughing or sneezing, cover your mouth and nose by coughing or sneezing into the elbow areas of your shirt or coat, into a tissue or into your sleeve (not your hands). Langley Gauss A MASK when in public  . Avoid shaking hands with others and consider head nods or verbal greetings only. . Avoid touching your eyes, nose, or mouth with unwashed hands.  . Avoid close contact with people who are sick. . Avoid places or events with large numbers of people in one location, like concerts or sporting events. . If you have some symptoms but not all symptoms, continue to monitor at home and seek medical attention if your symptoms worsen. . If you are having a medical emergency, call 911.   [place] / e-Visit: [email or web address]         [facility]: [facility]: [phone]                   [facility]: [phone]     It is flu season:   >>> Best ways to protect herself from the flu: Receive the yearly flu vaccine, practice good hand hygiene washing with soap and also using hand sanitizer when available, eat a nutritious meals, get adequate rest, hydrate appropriately   Please contact the office if your symptoms worsen or you have concerns that you are not improving.   Thank you for choosing [facility] for your healthcare, and for allowing [place] to partner with you on your healthcare journey. I am thankful to be able to provide care to you today.   Wyn Quaker FNP-C

## 2019-08-11 ENCOUNTER — Other Ambulatory Visit (HOSPITAL_COMMUNITY): Payer: Self-pay | Admitting: Internal Medicine

## 2019-08-11 ENCOUNTER — Other Ambulatory Visit (HOSPITAL_COMMUNITY)
Admission: RE | Admit: 2019-08-11 | Discharge: 2019-08-11 | Disposition: A | Discharge status: Home / Self Care | Payer: Medicare Other | Source: Ambulatory Visit

## 2019-08-11 ENCOUNTER — Ambulatory Visit (HOSPITAL_COMMUNITY)
Admission: RE | Admit: 2019-08-11 | Discharge: 2019-08-11 | Disposition: A | Discharge status: Home / Self Care | Payer: Medicare Other | Source: Ambulatory Visit | Attending: Student | Admitting: Student

## 2019-08-11 ENCOUNTER — Encounter: Payer: Medicare Other | Admitting: Physical Medicine & Rehabilitation

## 2019-08-11 DIAGNOSIS — Z471 Aftercare following joint replacement surgery: Secondary | ICD-10-CM | POA: Diagnosis not present

## 2019-08-11 DIAGNOSIS — I69351 Hemiplegia and hemiparesis following cerebral infarction affecting right dominant side: Secondary | ICD-10-CM | POA: Diagnosis not present

## 2019-08-11 DIAGNOSIS — L89154 Pressure ulcer of sacral region, stage 4: Secondary | ICD-10-CM

## 2019-08-11 DIAGNOSIS — M1612 Unilateral primary osteoarthritis, left hip: Secondary | ICD-10-CM | POA: Diagnosis not present

## 2019-08-11 DIAGNOSIS — M810 Age-related osteoporosis without current pathological fracture: Secondary | ICD-10-CM | POA: Diagnosis not present

## 2019-08-11 DIAGNOSIS — L89323 Pressure ulcer of left buttock, stage 3: Secondary | ICD-10-CM | POA: Insufficient documentation

## 2019-08-11 DIAGNOSIS — I69320 Aphasia following cerebral infarction: Secondary | ICD-10-CM | POA: Diagnosis not present

## 2019-08-11 DIAGNOSIS — I972 Postmastectomy lymphedema syndrome: Secondary | ICD-10-CM | POA: Diagnosis not present

## 2019-08-11 DIAGNOSIS — L89153 Pressure ulcer of sacral region, stage 3: Secondary | ICD-10-CM | POA: Diagnosis not present

## 2019-08-11 DIAGNOSIS — M150 Primary generalized (osteo)arthritis: Secondary | ICD-10-CM | POA: Diagnosis not present

## 2019-08-11 LAB — PREALBUMIN: Prealbumin: 10.3 mg/dL — ABNORMAL LOW (ref 18–38)

## 2019-08-12 DIAGNOSIS — M150 Primary generalized (osteo)arthritis: Secondary | ICD-10-CM | POA: Diagnosis not present

## 2019-08-12 DIAGNOSIS — I69320 Aphasia following cerebral infarction: Secondary | ICD-10-CM | POA: Diagnosis not present

## 2019-08-12 DIAGNOSIS — I972 Postmastectomy lymphedema syndrome: Secondary | ICD-10-CM | POA: Diagnosis not present

## 2019-08-12 DIAGNOSIS — I69351 Hemiplegia and hemiparesis following cerebral infarction affecting right dominant side: Secondary | ICD-10-CM | POA: Diagnosis not present

## 2019-08-12 DIAGNOSIS — R1310 Dysphagia, unspecified: Secondary | ICD-10-CM | POA: Diagnosis not present

## 2019-08-12 DIAGNOSIS — L89153 Pressure ulcer of sacral region, stage 3: Secondary | ICD-10-CM | POA: Diagnosis not present

## 2019-08-12 DIAGNOSIS — Z86711 Personal history of pulmonary embolism: Secondary | ICD-10-CM | POA: Diagnosis not present

## 2019-08-12 DIAGNOSIS — D473 Essential (hemorrhagic) thrombocythemia: Secondary | ICD-10-CM | POA: Diagnosis not present

## 2019-08-12 DIAGNOSIS — L89323 Pressure ulcer of left buttock, stage 3: Secondary | ICD-10-CM | POA: Diagnosis not present

## 2019-08-12 DIAGNOSIS — Z7901 Long term (current) use of anticoagulants: Secondary | ICD-10-CM | POA: Diagnosis not present

## 2019-08-12 DIAGNOSIS — K589 Irritable bowel syndrome without diarrhea: Secondary | ICD-10-CM | POA: Diagnosis not present

## 2019-08-12 DIAGNOSIS — Z5181 Encounter for therapeutic drug level monitoring: Secondary | ICD-10-CM | POA: Diagnosis not present

## 2019-08-14 DIAGNOSIS — K5900 Constipation, unspecified: Secondary | ICD-10-CM | POA: Diagnosis not present

## 2019-08-14 DIAGNOSIS — E042 Nontoxic multinodular goiter: Secondary | ICD-10-CM | POA: Diagnosis not present

## 2019-08-14 DIAGNOSIS — L89323 Pressure ulcer of left buttock, stage 3: Secondary | ICD-10-CM | POA: Diagnosis not present

## 2019-08-14 DIAGNOSIS — I69359 Hemiplegia and hemiparesis following cerebral infarction affecting unspecified side: Secondary | ICD-10-CM | POA: Diagnosis not present

## 2019-08-14 DIAGNOSIS — L89154 Pressure ulcer of sacral region, stage 4: Secondary | ICD-10-CM | POA: Diagnosis not present

## 2019-08-14 DIAGNOSIS — R5381 Other malaise: Secondary | ICD-10-CM | POA: Diagnosis not present

## 2019-08-15 ENCOUNTER — Encounter: Payer: Self-pay | Admitting: Physical Medicine & Rehabilitation

## 2019-08-15 ENCOUNTER — Other Ambulatory Visit: Payer: Self-pay

## 2019-08-15 ENCOUNTER — Ambulatory Visit: Payer: Medicare Other | Admitting: Internal Medicine

## 2019-08-15 ENCOUNTER — Encounter: Payer: Medicare Other | Attending: Physical Medicine & Rehabilitation | Admitting: Physical Medicine & Rehabilitation

## 2019-08-15 VITALS — BP 125/77 | HR 89 | Temp 98.8°F | Ht 64.0 in | Wt 176.0 lb

## 2019-08-15 DIAGNOSIS — M7501 Adhesive capsulitis of right shoulder: Secondary | ICD-10-CM | POA: Insufficient documentation

## 2019-08-15 NOTE — Patient Instructions (Addendum)
Adhesive Capsulitis  Adhesive capsulitis, also called frozen shoulder, causes the shoulder to become stiff and painful to move. This condition happens when there is inflammation of the tendons and ligaments that surround the shoulder joint (shoulder capsule). What are the causes? This condition may be caused by:  An injury to your shoulder joint.  Straining your shoulder.  Not moving your shoulder for a period of time. This can happen if your arm was injured or in a sling.  Long-standing conditions, such as: ? Diabetes. ? Thyroid problems. ? Heart disease. ? Stroke. ? Rheumatoid arthritis. ? Lung disease. In some cases, the cause is not known. What increases the risk? You are more likely to develop this condition if you are:  A woman.  Older than 84 years of age. What are the signs or symptoms? Symptoms of this condition include:  Pain in your shoulder when you move your arm. There may also be pain when parts of your shoulder are touched. The pain may be worse at night or when you are resting.  A sore or aching shoulder.  The inability to move your shoulder normally.  Muscle spasms. How is this diagnosed? This condition is diagnosed with a physical exam and imaging tests, such as an X-ray or MRI. How is this treated? This condition may be treated with:  Treatment of the underlying cause or condition.  Medicine. Medicine may be given to relieve pain, inflammation, or muscle spasms.  Steroid injections into the shoulder joint.  Physical therapy. This involves performing exercises to get the shoulder moving again.  Acupuncture. This is a type of treatment that involves stimulating specific points on your body by inserting thin needles through your skin.  Shoulder manipulation. This is a procedure to move the shoulder into another position. It is done after you are given a medicine to make you fall asleep (general anesthetic). The joint may also be injected with salt  water at high pressure to break down scarring.  Surgery. This may be done in severe cases when other treatments have failed. Although most people recover completely from adhesive capsulitis, some may not regain full shoulder movement. Follow these instructions at home: Managing pain, stiffness, and swelling      If directed, put ice on the injured area: ? Put ice in a plastic bag. ? Place a towel between your skin and the bag. ? Leave the ice on for 20 minutes, 2-3 times per day.  If directed, apply heat to the affected area before you exercise. Use the heat source that your health care provider recommends, such as a moist heat pack or a heating pad. ? Place a towel between your skin and the heat source. ? Leave the heat on for 20-30 minutes. ? Remove the heat if your skin turns bright red. This is especially important if you are unable to feel pain, heat, or cold. You may have a greater risk of getting burned. General instructions  Take over-the-counter and prescription medicines only as told by your health care provider.  If you are being treated with physical therapy, follow instructions from your physical therapist.  Avoid exercises that put a lot of demand on your shoulder, such as throwing. These exercises can make pain worse.  Keep all follow-up visits as told by your health care provider. This is important. Contact a health care provider if:  You develop new symptoms.  Your symptoms get worse. Summary  Adhesive capsulitis, also called frozen shoulder, causes the shoulder to become   stiff and painful to move.  You are more likely to have this condition if you are a woman and over age 23.  It is treated with physical therapy, medicines, and sometimes surgery. This information is not intended to replace advice given to you by your health care provider. Make sure you discuss any questions you have with your health care provider. Document Revised: 05/28/2017 Document  Reviewed: 05/28/2017 Elsevier Patient Education  [place].   Please call if repeat knee or shoulder injection is needed

## 2019-08-15 NOTE — Progress Notes (Signed)
Shoulder injection Right glenohumeral With ultrasound guidance)  Indication:Right Shoulder pain not relieved by medication management and other conservative care.  Informed consent was obtained after describing risks and benefits of the procedure with the patient, this includes bleeding, bruising, infection and medication side effects. The patient wishes to proceed and has given written consent. Patient was placed in a seated position. The Right shoulder was marked and prepped with betadine in the posterior subacromial area. A 25-gauge 1-1/2 inch needle was inserted into the subacromial area.30ml of 1% lidocaine infiltrated into the skin and sub cut tissue. The same needle was inserted under direct [place] visualization long axis into the glenohumeral joint   After negative draw back for blood, a solution containing 1 mL of 6 mg per ML betamethasone and 4 mL of 1% lidocaine was injected. A band aid was applied. The patient tolerated the procedure well. Post procedure instructions were given.

## 2019-08-16 DIAGNOSIS — M150 Primary generalized (osteo)arthritis: Secondary | ICD-10-CM | POA: Diagnosis not present

## 2019-08-16 DIAGNOSIS — L89323 Pressure ulcer of left buttock, stage 3: Secondary | ICD-10-CM | POA: Diagnosis not present

## 2019-08-16 DIAGNOSIS — L89153 Pressure ulcer of sacral region, stage 3: Secondary | ICD-10-CM | POA: Diagnosis not present

## 2019-08-16 DIAGNOSIS — I69351 Hemiplegia and hemiparesis following cerebral infarction affecting right dominant side: Secondary | ICD-10-CM | POA: Diagnosis not present

## 2019-08-16 DIAGNOSIS — I972 Postmastectomy lymphedema syndrome: Secondary | ICD-10-CM | POA: Diagnosis not present

## 2019-08-16 DIAGNOSIS — I69320 Aphasia following cerebral infarction: Secondary | ICD-10-CM | POA: Diagnosis not present

## 2019-08-18 DIAGNOSIS — M150 Primary generalized (osteo)arthritis: Secondary | ICD-10-CM | POA: Diagnosis not present

## 2019-08-18 DIAGNOSIS — I69320 Aphasia following cerebral infarction: Secondary | ICD-10-CM | POA: Diagnosis not present

## 2019-08-18 DIAGNOSIS — I972 Postmastectomy lymphedema syndrome: Secondary | ICD-10-CM | POA: Diagnosis not present

## 2019-08-18 DIAGNOSIS — I69351 Hemiplegia and hemiparesis following cerebral infarction affecting right dominant side: Secondary | ICD-10-CM | POA: Diagnosis not present

## 2019-08-18 DIAGNOSIS — L89153 Pressure ulcer of sacral region, stage 3: Secondary | ICD-10-CM | POA: Diagnosis not present

## 2019-08-18 DIAGNOSIS — L89323 Pressure ulcer of left buttock, stage 3: Secondary | ICD-10-CM | POA: Diagnosis not present

## 2019-08-20 NOTE — Progress Notes (Signed)
Subjective:    Patient ID: Sandy Barrett, female    DOB: 1932/07/11, 86 y.o.   MRN: [id number]  HPI The patient is here for follow up of their chronic medical problems, including neuralgia, sleep difficulties, GERD, frequent Uti's and long term a/c for h/o PE, DVT  Her daughter provides the history due to her being non-verbal.    She had blood work done recently at [facility] and her blood sugar was low but she had not eaten yet that day.  Her WBC, plt and crp were high.  RBC were large.  She was not sure what was new or a concern.    She is still following with wound at [facility].  Her sacral ulcer is healing very slowly.    She has had less issues with swallowing.  She has some gurgling after eating at times.  [facility] wants to do another embolization.      Medications and allergies reviewed with patient and updated if appropriate.  Patient Active Problem List   Diagnosis Date Noted  . Rhonchi 05/11/2019  . Urinary incontinence without sensory awareness 04/26/2019  . Eye disorder 02/12/2019  . GERD (gastroesophageal reflux disease) 02/12/2019  . Decubitus ulcer of back, unstageable ([place]) 10/25/2018  . Right breast lymphedema 10/18/2018  . Sleep difficulties 05/24/2018  . Right shoulder pain 11/24/2017  . Hallucinations 11/24/2017  . Adhesive capsulitis of right shoulder 11/23/2017  . Bilateral leg edema 05/24/2017  . Cholecystitis 02/23/2017  . Chronic respiratory failure with hypoxia ([place]) 02/23/2017  . Neuralgia 01/09/2017  . Venous congestion 10/08/2016  . Lymphedema of breast 09/11/2016  . Long term (current) use of anticoagulants [Z79.01] 09/10/2016  . Left hip pain 08/09/2016  . Left leg swelling 05/18/2016  . Pressure ulcer 04/27/2016  . Aspiration pneumonia ([place]) 04/20/2016  . Hemidiaphragm paralysis   . Wheelchair bound 02/03/2016  . Discoloration of skin 09/26/2015  . Essential thrombocytosis ([place]) 07/31/2015  . SVC (superior vena cava obstruction), chronic  07/20/2015  . Dysphagia 07/20/2015  . Constipation 07/20/2015  . Thrombophlebitis of breast, right 06/18/2015  . Tracheal deviation 06/06/2015  . Sensorineural hearing loss, bilateral, moderate-moderately severe 04/30/2015  . Abdominal wall lump 03/09/2015  . Frequent UTI 02/06/2015  . Primary osteoarthritis involving multiple joints 07/26/2014  . Pernicious anemia 07/26/2014  . Encounter for therapeutic drug monitoring 07/17/2014  . Spastic hemiparesis affecting dominant side ([place]), right 05/31/2014  . DVT of lower extremity, bilateral ([place]) 03/14/2014  . Global aphasia 03/14/2014  . 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 Aphasia S/P CVA 03/13/2014  . Cerebral infarction due to embolism of left middle cerebral artery ([place])   . Stroke, embolic ([place]) 66/06/3014  . History of pulmonary embolism 03/08/2014  . Primary localized osteoarthrosis, lower leg 03/06/2013  . IBS (irritable bowel syndrome)   . Multinodular goiter 01/13/2011  . Hyperthyroidism 11/11/2009  . Osteoarthritis 04/21/2008  . Migraine headache 04/20/2008    Current Outpatient Medications on File Prior to Visit  Medication Sig Dispense Refill  . acetaminophen (TYLENOL) 160 MG/5ML liquid Take 480 mg by mouth every 4 (four) hours as needed for fever or pain.    [name] azelastine (ASTELIN) 0.1 % nasal spray Place 2 sprays into both nostrils 2 (two) times daily. Use in each nostril as directed 30 mL 11  . ciclopirox (PENLAC) 8 % solution APPLY TOPICALLY AT BEDTIME OVER NAIL & SURROUNDING SKIN,APPLY DAILY OVER PREVIOUS COAT,AFTER 7 DAYS REMOVE WITH ALCOHOL AND CONTINUE CYCLE 6.6 mL 5  .  diclofenac Sodium (VOLTAREN) 1 % GEL APPLY 2 G TOPICALLY 3 (THREE) TIMES DAILY AS NEEDED. 300 g 0  . hydroxyurea (HYDREA) 500 MG capsule TAKE 1 TABLET (500 MG) DAILY EXCEPT 2 TABLETS ( 1000 MG ) ON WEDNESDAY, SATURDAY AND SUNDAY 360 capsule 1  . ipratropium-albuterol (DUONEB) 0.5-2.5 (3) MG/3ML SOLN Take 3 mLs by nebulization every 6 (six) hours as  needed (shortness of breath). 360 mL 3  . lidocaine (LIDODERM) 5 % Place 1 patch daily onto the skin. Remove & Discard patch within 12 hours or as directed by MD (Patient taking differently: Place 1 patch onto the skin daily as needed (pain). Remove & Discard patch within 12 hours or as directed by MD) 15 patch 0  . NONFORMULARY OR COMPOUNDED ITEM Topical cream with equal parts diclofenac, gabapentin, lidocaine, menthol  Disp 100 gm 1 each 5  . Oral Hygiene Products (Q-CARE COVERD [place]ER/SUCTION) MISC Use as directed 10 each 5  . OXYGEN 2lpm 24/7  DME- [facility]    . polyethylene glycol (MIRALAX / GLYCOLAX) packet TAKE 17G BY MOUTH TWICE A DAY (Patient taking differently: Take 17 g by mouth 2 (two) times daily as needed for mild constipation. As needed) 14 packet 1  . pregabalin (LYRICA) 50 MG capsule TAKE 1 CAPSULE BY MOUTH THREE TIMES A DAY 90 capsule 1  . Probiotic Product (ALIGN) 4 MG CAPS Take 4 mg by mouth daily.     . propranolol (INDERAL) 10 MG tablet TAKE 0.5 TABLETS (5 MG TOTAL) BY MOUTH 2 (TWO) TIMES DAILY. 60 tablet 2  . PROTONIX 40 MG PACK TAKE 20 MLS (40 MG TOTAL) BY MOUTH 2 (TWO) TIMES DAILY. 18 mL 3  . traMADol (ULTRAM) 50 MG tablet TAKE 2 TABLETS BY MOUTH 3 TIMES A DAY AS NEEDED 180 tablet 1  . traZODone (DESYREL) 50 MG tablet TAKE 1 TABLET (50 MG TOTAL) BY MOUTH AT BEDTIME AS NEEDED. FOR SLEEP 90 tablet 1  . warfarin (COUMADIN) 4 MG tablet TAKE 1 TABLET DAILY EXCEPT 1/2 ON SUNDAY AND THURSDAYS OR TAKE AS DIRECTED BY ANTICOAGULATION CLINIC 90 tablet 1   No current facility-administered medications on file prior to visit.    Past Medical History:  Diagnosis Date  . ARTHRITIS   . Arthritis   . Diverticulosis   . Essential thrombocytosis ([place])   . HYPERTENSION   . HYPERTHYROIDISM   . Hyperthyroidism    s/p I-131 ablation 03/2011 of multinod goiter  . INSOMNIA   . MIGRAINE HEADACHE   . OBESITY   . Posttraumatic stress disorder   . Pulmonary embolism ([place]) 03/2014   with DVT    . Stroke [facility]) 03/2014   dysarthria    Past Surgical History:  Procedure Laterality Date  . ABDOMINAL HYSTERECTOMY  1976  . BREAST SURGERY     biopsy  . RADIOLOGY WITH ANESTHESIA Left 03/08/2014   Procedure: RADIOLOGY WITH ANESTHESIA;  Surgeon: Rob Hickman, MD;  Location: [place];  Service: Radiology;  Laterality: Left;  . TONSILLECTOMY    . TOTAL HIP ARTHROPLASTY  1998    right    Social History   Socioeconomic History  . Marital status: Widowed    Spouse name: Not on file  . Number of children: 2  . Years of education: 22  . Highest education level: Not on file  Occupational History  . Occupation: retired    Comment: Restaurant manager, fast food  Tobacco Use  . Smoking status: Never Smoker  . Smokeless tobacco: Never Used  Substance and Sexual Activity  . Alcohol use: No    Alcohol/week: 0.0 standard drinks  . Drug use: No  . Sexual activity: Never  Other Topics Concern  . Not on file  Social History Narrative   Widowed, lived alone prior to CVA 03/2014   SNF at [facility], then home 07/2014 with dtr   Right handed   Caffeine use- occasionally drinks tea   Social Determinants of Health   Financial Resource Strain:   . Difficulty of Paying Living Expenses:   Food Insecurity:   . Worried About Charity fundraiser in the Last Year:   . Arboriculturist in the Last Year:   Transportation Needs:   . Film/video editor (Medical):   [name] Lack of Transportation (Non-Medical):   Physical Activity:   . Days of Exercise per Week:   . Minutes of Exercise per Session:   Stress:   . Feeling of Stress :   Social Connections:   . Frequency of Communication with Friends and Family:   . Frequency of Social Gatherings with Friends and Family:   . Attends Religious Services:   . Active Member of Clubs or Organizations:   . Attends Archivist Meetings:   [name] Marital Status:     Family History  Problem Relation Age of Onset  . Asthma Mother   . Asthma Father    . Prostate cancer Father   . Stroke Brother   . Breast cancer Sister   . Stomach cancer Sister   . Thyroid disease Neg Hx     Review of Systems     Objective:  There were no vitals filed for this visit. BP Readings from Last 3 Encounters:  08/15/19 125/77  08/10/19 128/78  05/11/19 116/72   Wt Readings from Last 3 Encounters:  08/15/19 176 lb (79.8 kg)  08/10/19 176 lb (79.8 kg)  11/23/18 176 lb (79.8 kg)   There is no height or weight on file to calculate BMI.   Physical Exam    Constitutional: Appears well-developed and well-nourished. No distress.  HENT:  Head: Normocephalic and atraumatic.  Neck: Neck supple. No tracheal deviation present. 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  No cervical lymphadenopathy Cardiovascular: Normal rate, regular rhythm and normal heart sounds.   2/6 syst murmur heard. No carotid bruit .  Mild b/l LE non pitting edema Pulmonary/Chest: Effort normal and breath sounds normal. No respiratory distress. No has no wheezes. No rales. Abd: soft, NT  Skin: Skin is warm and dry. Not diaphoretic.       Assessment & Plan:    See Problem List for Assessment and Plan of chronic medical problems.    This visit occurred during the SARS-CoV-2 public health emergency.  Safety protocols were in place, including screening questions prior to the visit, additional usage of staff PPE, and extensive cleaning of exam room while observing appropriate contact time as indicated for disinfecting solutions.

## 2019-08-20 NOTE — Patient Instructions (Addendum)
  Medications reviewed and updated.  Changes include :   none    Please followup in 6 months   

## 2019-08-21 ENCOUNTER — Encounter: Payer: Self-pay | Admitting: Internal Medicine

## 2019-08-21 ENCOUNTER — Other Ambulatory Visit: Payer: Self-pay | Admitting: Internal Medicine

## 2019-08-21 ENCOUNTER — Other Ambulatory Visit: Payer: Self-pay

## 2019-08-21 ENCOUNTER — Ambulatory Visit (INDEPENDENT_AMBULATORY_CARE_PROVIDER_SITE_OTHER): Payer: Medicare Other | Admitting: Internal Medicine

## 2019-08-21 VITALS — BP 116/72 | HR 88 | Temp 98.6°F | Ht 64.0 in

## 2019-08-21 DIAGNOSIS — M8949 Other hypertrophic osteoarthropathy, multiple sites: Secondary | ICD-10-CM

## 2019-08-21 DIAGNOSIS — Z7901 Long term (current) use of anticoagulants: Secondary | ICD-10-CM

## 2019-08-21 DIAGNOSIS — M792 Neuralgia and neuritis, unspecified: Secondary | ICD-10-CM | POA: Diagnosis not present

## 2019-08-21 DIAGNOSIS — G479 Sleep disorder, unspecified: Secondary | ICD-10-CM

## 2019-08-21 DIAGNOSIS — L8990 Pressure ulcer of unspecified site, unspecified stage: Secondary | ICD-10-CM

## 2019-08-21 DIAGNOSIS — K219 Gastro-esophageal reflux disease without esophagitis: Secondary | ICD-10-CM

## 2019-08-21 DIAGNOSIS — M159 Polyosteoarthritis, unspecified: Secondary | ICD-10-CM

## 2019-08-21 NOTE — Assessment & Plan Note (Signed)
Chronic Related prior CVAs Continue lyrica 50 mg TID

## 2019-08-21 NOTE — Assessment & Plan Note (Signed)
H/o of PE and DVT On lifelong warfarin  Monitored by [name] in our coumadin clinic

## 2019-08-21 NOTE — Assessment & Plan Note (Signed)
Chronic GERD controlled Continue daily medication  

## 2019-08-21 NOTE — Assessment & Plan Note (Signed)
Chronic Controlled, stable Continue current dose of medication trazodone

## 2019-08-21 NOTE — Assessment & Plan Note (Signed)
Chronic Continue tramadol 100 mg TID and tylenol

## 2019-08-21 NOTE — Assessment & Plan Note (Signed)
Chronic Slowly healing Following at wound center at [facility] on inc protein intake

## 2019-08-22 DIAGNOSIS — E0520 Thyrotoxicosis with toxic multinodular goiter without thyrotoxic crisis or storm: Secondary | ICD-10-CM | POA: Diagnosis not present

## 2019-08-23 DIAGNOSIS — M150 Primary generalized (osteo)arthritis: Secondary | ICD-10-CM | POA: Diagnosis not present

## 2019-08-23 DIAGNOSIS — I972 Postmastectomy lymphedema syndrome: Secondary | ICD-10-CM | POA: Diagnosis not present

## 2019-08-23 DIAGNOSIS — L89153 Pressure ulcer of sacral region, stage 3: Secondary | ICD-10-CM | POA: Diagnosis not present

## 2019-08-23 DIAGNOSIS — L89323 Pressure ulcer of left buttock, stage 3: Secondary | ICD-10-CM | POA: Diagnosis not present

## 2019-08-23 DIAGNOSIS — I69320 Aphasia following cerebral infarction: Secondary | ICD-10-CM | POA: Diagnosis not present

## 2019-08-23 DIAGNOSIS — I69351 Hemiplegia and hemiparesis following cerebral infarction affecting right dominant side: Secondary | ICD-10-CM | POA: Diagnosis not present

## 2019-08-24 ENCOUNTER — Ambulatory Visit: Payer: Medicare Other | Admitting: Physical Medicine & Rehabilitation

## 2019-08-24 ENCOUNTER — Ambulatory Visit (INDEPENDENT_AMBULATORY_CARE_PROVIDER_SITE_OTHER): Payer: Medicare Other | Admitting: General Practice

## 2019-08-24 DIAGNOSIS — Z7901 Long term (current) use of anticoagulants: Secondary | ICD-10-CM

## 2019-08-24 DIAGNOSIS — I824Z3 Acute embolism and thrombosis of unspecified deep veins of distal lower extremity, bilateral: Secondary | ICD-10-CM

## 2019-08-24 LAB — POCT INR: INR: 1.3 — AB (ref 2.0–3.0)

## 2019-08-24 NOTE — Patient Instructions (Addendum)
Pre visit review using our clinic review tool, if applicable. No additional management support is needed unless otherwise documented below in the visit note.  Take 1 1/2 tablets today, tomorrow and Saturday and then continue to take 1 tablet daily and 1/2 tablet on tuesdays and Saturdays.  Re-check in 1 weeks.  Dosing instructions given to  [name], RN @ [facility] [phone] and also left instructions with patient's daughter, [name].  LMOVM

## 2019-08-24 NOTE — Progress Notes (Signed)
Agree with management.  Camelia Stelzner J Alleyah Twombly, MD  

## 2019-08-25 ENCOUNTER — Other Ambulatory Visit: Payer: Self-pay | Admitting: Internal Medicine

## 2019-08-28 ENCOUNTER — Other Ambulatory Visit: Payer: Self-pay | Admitting: Internal Medicine

## 2019-08-30 ENCOUNTER — Ambulatory Visit (INDEPENDENT_AMBULATORY_CARE_PROVIDER_SITE_OTHER): Payer: Medicare Other | Admitting: General Practice

## 2019-08-30 DIAGNOSIS — Z7901 Long term (current) use of anticoagulants: Secondary | ICD-10-CM

## 2019-08-30 DIAGNOSIS — I972 Postmastectomy lymphedema syndrome: Secondary | ICD-10-CM | POA: Diagnosis not present

## 2019-08-30 DIAGNOSIS — L89323 Pressure ulcer of left buttock, stage 3: Secondary | ICD-10-CM | POA: Diagnosis not present

## 2019-08-30 DIAGNOSIS — L89153 Pressure ulcer of sacral region, stage 3: Secondary | ICD-10-CM | POA: Diagnosis not present

## 2019-08-30 DIAGNOSIS — I69320 Aphasia following cerebral infarction: Secondary | ICD-10-CM | POA: Diagnosis not present

## 2019-08-30 DIAGNOSIS — M150 Primary generalized (osteo)arthritis: Secondary | ICD-10-CM | POA: Diagnosis not present

## 2019-08-30 DIAGNOSIS — I69351 Hemiplegia and hemiparesis following cerebral infarction affecting right dominant side: Secondary | ICD-10-CM | POA: Diagnosis not present

## 2019-08-30 LAB — POCT INR: INR: 3.6 — AB (ref 2.0–3.0)

## 2019-08-30 NOTE — Progress Notes (Signed)
I have reviewed the results and agree with this plan   

## 2019-08-30 NOTE — Patient Instructions (Addendum)
Pre visit review using our clinic review tool, if applicable. No additional management support is needed unless otherwise documented below in the visit note.  Hold dosage today and then continue to take 1 tablet daily and 1/2 tablet on tuesdays and Saturdays.  Re-check in 1 weeks.  Dosing instructions given to  [name], RN @ [facility] [phone].

## 2019-09-04 NOTE — Telephone Encounter (Signed)
Error

## 2019-09-06 DIAGNOSIS — L89323 Pressure ulcer of left buttock, stage 3: Secondary | ICD-10-CM | POA: Diagnosis not present

## 2019-09-06 DIAGNOSIS — I69351 Hemiplegia and hemiparesis following cerebral infarction affecting right dominant side: Secondary | ICD-10-CM | POA: Diagnosis not present

## 2019-09-06 DIAGNOSIS — M150 Primary generalized (osteo)arthritis: Secondary | ICD-10-CM | POA: Diagnosis not present

## 2019-09-06 DIAGNOSIS — I69320 Aphasia following cerebral infarction: Secondary | ICD-10-CM | POA: Diagnosis not present

## 2019-09-06 DIAGNOSIS — L89153 Pressure ulcer of sacral region, stage 3: Secondary | ICD-10-CM | POA: Diagnosis not present

## 2019-09-06 DIAGNOSIS — I972 Postmastectomy lymphedema syndrome: Secondary | ICD-10-CM | POA: Diagnosis not present

## 2019-09-11 DIAGNOSIS — I69320 Aphasia following cerebral infarction: Secondary | ICD-10-CM | POA: Diagnosis not present

## 2019-09-11 DIAGNOSIS — K589 Irritable bowel syndrome without diarrhea: Secondary | ICD-10-CM | POA: Diagnosis not present

## 2019-09-11 DIAGNOSIS — I69351 Hemiplegia and hemiparesis following cerebral infarction affecting right dominant side: Secondary | ICD-10-CM | POA: Diagnosis not present

## 2019-09-11 DIAGNOSIS — D473 Essential (hemorrhagic) thrombocythemia: Secondary | ICD-10-CM | POA: Diagnosis not present

## 2019-09-11 DIAGNOSIS — M150 Primary generalized (osteo)arthritis: Secondary | ICD-10-CM | POA: Diagnosis not present

## 2019-09-11 DIAGNOSIS — I972 Postmastectomy lymphedema syndrome: Secondary | ICD-10-CM | POA: Diagnosis not present

## 2019-09-11 DIAGNOSIS — L89323 Pressure ulcer of left buttock, stage 3: Secondary | ICD-10-CM | POA: Diagnosis not present

## 2019-09-11 DIAGNOSIS — Z5181 Encounter for therapeutic drug level monitoring: Secondary | ICD-10-CM | POA: Diagnosis not present

## 2019-09-11 DIAGNOSIS — L89153 Pressure ulcer of sacral region, stage 3: Secondary | ICD-10-CM | POA: Diagnosis not present

## 2019-09-11 DIAGNOSIS — Z86711 Personal history of pulmonary embolism: Secondary | ICD-10-CM | POA: Diagnosis not present

## 2019-09-11 DIAGNOSIS — Z7901 Long term (current) use of anticoagulants: Secondary | ICD-10-CM | POA: Diagnosis not present

## 2019-09-13 ENCOUNTER — Ambulatory Visit (INDEPENDENT_AMBULATORY_CARE_PROVIDER_SITE_OTHER): Payer: Medicare Other | Admitting: General Practice

## 2019-09-13 DIAGNOSIS — I69320 Aphasia following cerebral infarction: Secondary | ICD-10-CM | POA: Diagnosis not present

## 2019-09-13 DIAGNOSIS — I2699 Other pulmonary embolism without acute cor pulmonale: Secondary | ICD-10-CM | POA: Diagnosis not present

## 2019-09-13 DIAGNOSIS — I82403 Acute embolism and thrombosis of unspecified deep veins of lower extremity, bilateral: Secondary | ICD-10-CM

## 2019-09-13 DIAGNOSIS — I69351 Hemiplegia and hemiparesis following cerebral infarction affecting right dominant side: Secondary | ICD-10-CM | POA: Diagnosis not present

## 2019-09-13 DIAGNOSIS — I63311 Cerebral infarction due to thrombosis of right middle cerebral artery: Secondary | ICD-10-CM | POA: Diagnosis not present

## 2019-09-13 DIAGNOSIS — I972 Postmastectomy lymphedema syndrome: Secondary | ICD-10-CM | POA: Diagnosis not present

## 2019-09-13 DIAGNOSIS — L89323 Pressure ulcer of left buttock, stage 3: Secondary | ICD-10-CM | POA: Diagnosis not present

## 2019-09-13 DIAGNOSIS — Z7901 Long term (current) use of anticoagulants: Secondary | ICD-10-CM | POA: Diagnosis not present

## 2019-09-13 DIAGNOSIS — M150 Primary generalized (osteo)arthritis: Secondary | ICD-10-CM | POA: Diagnosis not present

## 2019-09-13 DIAGNOSIS — L89153 Pressure ulcer of sacral region, stage 3: Secondary | ICD-10-CM | POA: Diagnosis not present

## 2019-09-13 LAB — POCT INR: INR: 2.6 (ref 2.0–3.0)

## 2019-09-13 NOTE — Patient Instructions (Signed)
Pre visit review using our clinic review tool, if applicable. No additional management support is needed unless otherwise documented below in the visit note.  Continue to take 1 tablet daily and 1/2 tablet on tuesdays and Saturdays.  Re-check in 1 weeks.  Dosing instructions given to  [name], RN @ [facility] [phone] and also left instructions with patient's daughter, [name].  LMOVM

## 2019-09-20 DIAGNOSIS — M150 Primary generalized (osteo)arthritis: Secondary | ICD-10-CM | POA: Diagnosis not present

## 2019-09-20 DIAGNOSIS — L89323 Pressure ulcer of left buttock, stage 3: Secondary | ICD-10-CM | POA: Diagnosis not present

## 2019-09-20 DIAGNOSIS — L89153 Pressure ulcer of sacral region, stage 3: Secondary | ICD-10-CM | POA: Diagnosis not present

## 2019-09-20 DIAGNOSIS — I69320 Aphasia following cerebral infarction: Secondary | ICD-10-CM | POA: Diagnosis not present

## 2019-09-20 DIAGNOSIS — I69351 Hemiplegia and hemiparesis following cerebral infarction affecting right dominant side: Secondary | ICD-10-CM | POA: Diagnosis not present

## 2019-09-20 DIAGNOSIS — I972 Postmastectomy lymphedema syndrome: Secondary | ICD-10-CM | POA: Diagnosis not present

## 2019-09-25 DIAGNOSIS — E042 Nontoxic multinodular goiter: Secondary | ICD-10-CM | POA: Diagnosis not present

## 2019-09-25 DIAGNOSIS — I69359 Hemiplegia and hemiparesis following cerebral infarction affecting unspecified side: Secondary | ICD-10-CM | POA: Diagnosis not present

## 2019-09-25 DIAGNOSIS — L89154 Pressure ulcer of sacral region, stage 4: Secondary | ICD-10-CM | POA: Diagnosis not present

## 2019-09-25 DIAGNOSIS — R5381 Other malaise: Secondary | ICD-10-CM | POA: Diagnosis not present

## 2019-09-25 DIAGNOSIS — L89323 Pressure ulcer of left buttock, stage 3: Secondary | ICD-10-CM | POA: Diagnosis not present

## 2019-09-27 DIAGNOSIS — L89323 Pressure ulcer of left buttock, stage 3: Secondary | ICD-10-CM | POA: Diagnosis not present

## 2019-09-27 DIAGNOSIS — M150 Primary generalized (osteo)arthritis: Secondary | ICD-10-CM | POA: Diagnosis not present

## 2019-09-27 DIAGNOSIS — I972 Postmastectomy lymphedema syndrome: Secondary | ICD-10-CM | POA: Diagnosis not present

## 2019-09-27 DIAGNOSIS — I69351 Hemiplegia and hemiparesis following cerebral infarction affecting right dominant side: Secondary | ICD-10-CM | POA: Diagnosis not present

## 2019-09-27 DIAGNOSIS — I69320 Aphasia following cerebral infarction: Secondary | ICD-10-CM | POA: Diagnosis not present

## 2019-09-27 DIAGNOSIS — L89153 Pressure ulcer of sacral region, stage 3: Secondary | ICD-10-CM | POA: Diagnosis not present

## 2019-10-06 ENCOUNTER — Telehealth: Payer: Self-pay | Admitting: *Deleted

## 2019-10-06 DIAGNOSIS — L89153 Pressure ulcer of sacral region, stage 3: Secondary | ICD-10-CM | POA: Diagnosis not present

## 2019-10-06 DIAGNOSIS — M150 Primary generalized (osteo)arthritis: Secondary | ICD-10-CM | POA: Diagnosis not present

## 2019-10-06 DIAGNOSIS — I69320 Aphasia following cerebral infarction: Secondary | ICD-10-CM | POA: Diagnosis not present

## 2019-10-06 DIAGNOSIS — I69351 Hemiplegia and hemiparesis following cerebral infarction affecting right dominant side: Secondary | ICD-10-CM | POA: Diagnosis not present

## 2019-10-06 DIAGNOSIS — I972 Postmastectomy lymphedema syndrome: Secondary | ICD-10-CM | POA: Diagnosis not present

## 2019-10-06 DIAGNOSIS — L89323 Pressure ulcer of left buttock, stage 3: Secondary | ICD-10-CM | POA: Diagnosis not present

## 2019-10-06 NOTE — Telephone Encounter (Signed)
Called Kathlee Nations there was no answer LMOM w/MD response.Marland KitchenJohny Chess

## 2019-10-06 NOTE — Telephone Encounter (Signed)
Rec'd call report INR of 1.7. Sandy Barrett is out of the office pls advise.Marland KitchenJohny Chess

## 2019-10-06 NOTE — Telephone Encounter (Signed)
Have her take an extra 1/2 tab tonight and continue regular dosing.  Return for INR as scheduled

## 2019-10-09 ENCOUNTER — Telehealth: Payer: Self-pay | Admitting: Hematology and Oncology

## 2019-10-09 ENCOUNTER — Telehealth: Payer: Self-pay

## 2019-10-09 NOTE — Telephone Encounter (Signed)
Scheduled appt per 10/4 sch msg - unable to reach pt daughter - left message for patient with appt date and time

## 2019-10-09 NOTE — Telephone Encounter (Signed)
-----   Message from Heath Lark, MD sent at 10/09/2019  7:54 AM EDT ----- Regarding: call daughter I saw documentation from [facility] she had CBC done on 9/20 with near normal CBC I suggest cancelling her appt this month and move to future in 6 months with labs If she agrees, pls proceed to cancel and reschedule

## 2019-10-09 NOTE — Telephone Encounter (Signed)
Called and given below message to daughter. She verbalized understanding. She is agreeable to canceled appts for this month and scheduling message sent to reschedule in 6 months.

## 2019-10-11 ENCOUNTER — Ambulatory Visit (INDEPENDENT_AMBULATORY_CARE_PROVIDER_SITE_OTHER): Payer: Medicare Other | Admitting: General Practice

## 2019-10-11 DIAGNOSIS — I972 Postmastectomy lymphedema syndrome: Secondary | ICD-10-CM | POA: Diagnosis not present

## 2019-10-11 DIAGNOSIS — Z86711 Personal history of pulmonary embolism: Secondary | ICD-10-CM | POA: Diagnosis not present

## 2019-10-11 DIAGNOSIS — Z7901 Long term (current) use of anticoagulants: Secondary | ICD-10-CM | POA: Diagnosis not present

## 2019-10-11 DIAGNOSIS — L89153 Pressure ulcer of sacral region, stage 3: Secondary | ICD-10-CM | POA: Diagnosis not present

## 2019-10-11 DIAGNOSIS — I69351 Hemiplegia and hemiparesis following cerebral infarction affecting right dominant side: Secondary | ICD-10-CM | POA: Diagnosis not present

## 2019-10-11 DIAGNOSIS — K589 Irritable bowel syndrome without diarrhea: Secondary | ICD-10-CM | POA: Diagnosis not present

## 2019-10-11 DIAGNOSIS — D473 Essential (hemorrhagic) thrombocythemia: Secondary | ICD-10-CM | POA: Diagnosis not present

## 2019-10-11 DIAGNOSIS — I69320 Aphasia following cerebral infarction: Secondary | ICD-10-CM | POA: Diagnosis not present

## 2019-10-11 DIAGNOSIS — Z5181 Encounter for therapeutic drug level monitoring: Secondary | ICD-10-CM | POA: Diagnosis not present

## 2019-10-11 DIAGNOSIS — L89323 Pressure ulcer of left buttock, stage 3: Secondary | ICD-10-CM | POA: Diagnosis not present

## 2019-10-11 DIAGNOSIS — M150 Primary generalized (osteo)arthritis: Secondary | ICD-10-CM | POA: Diagnosis not present

## 2019-10-11 LAB — POCT INR: INR: 3.7 — AB (ref 2.0–3.0)

## 2019-10-11 NOTE — Patient Instructions (Signed)
Pre visit review using our clinic review tool, if applicable. No additional management support is needed unless otherwise documented below in the visit note.  Hold dosage today and the continue to take 1 tablet daily and 1/2 tablet on tuesdays and Saturdays.  Re-check in 2 weeks.  Dosing instructions given to  [name], RN @ [facility] [phone] and also left instructions with patient's daughter, [name].  LMOVM

## 2019-10-11 NOTE — Progress Notes (Signed)
I have reviewed the results and agree with this plan   

## 2019-10-18 ENCOUNTER — Telehealth: Payer: Self-pay | Admitting: Internal Medicine

## 2019-10-18 NOTE — Telephone Encounter (Signed)
[name] daughter has a few questions about coumadin levels and supplements. Can contact her at [phone]

## 2019-10-20 DIAGNOSIS — L89323 Pressure ulcer of left buttock, stage 3: Secondary | ICD-10-CM | POA: Diagnosis not present

## 2019-10-20 DIAGNOSIS — I69351 Hemiplegia and hemiparesis following cerebral infarction affecting right dominant side: Secondary | ICD-10-CM | POA: Diagnosis not present

## 2019-10-20 DIAGNOSIS — M150 Primary generalized (osteo)arthritis: Secondary | ICD-10-CM | POA: Diagnosis not present

## 2019-10-20 DIAGNOSIS — L89153 Pressure ulcer of sacral region, stage 3: Secondary | ICD-10-CM | POA: Diagnosis not present

## 2019-10-20 DIAGNOSIS — I972 Postmastectomy lymphedema syndrome: Secondary | ICD-10-CM | POA: Diagnosis not present

## 2019-10-20 DIAGNOSIS — I69320 Aphasia following cerebral infarction: Secondary | ICD-10-CM | POA: Diagnosis not present

## 2019-10-21 ENCOUNTER — Other Ambulatory Visit: Payer: Self-pay | Admitting: Internal Medicine

## 2019-10-25 ENCOUNTER — Ambulatory Visit (INDEPENDENT_AMBULATORY_CARE_PROVIDER_SITE_OTHER): Payer: Medicare Other | Admitting: Podiatry

## 2019-10-25 ENCOUNTER — Encounter: Payer: Self-pay | Admitting: Podiatry

## 2019-10-25 ENCOUNTER — Other Ambulatory Visit: Payer: Self-pay

## 2019-10-25 ENCOUNTER — Ambulatory Visit (INDEPENDENT_AMBULATORY_CARE_PROVIDER_SITE_OTHER): Payer: Medicare Other | Admitting: General Practice

## 2019-10-25 DIAGNOSIS — I69351 Hemiplegia and hemiparesis following cerebral infarction affecting right dominant side: Secondary | ICD-10-CM | POA: Diagnosis not present

## 2019-10-25 DIAGNOSIS — M79675 Pain in left toe(s): Secondary | ICD-10-CM

## 2019-10-25 DIAGNOSIS — Z7901 Long term (current) use of anticoagulants: Secondary | ICD-10-CM

## 2019-10-25 DIAGNOSIS — B351 Tinea unguium: Secondary | ICD-10-CM | POA: Diagnosis not present

## 2019-10-25 DIAGNOSIS — L89153 Pressure ulcer of sacral region, stage 3: Secondary | ICD-10-CM | POA: Diagnosis not present

## 2019-10-25 DIAGNOSIS — M79674 Pain in right toe(s): Secondary | ICD-10-CM | POA: Diagnosis not present

## 2019-10-25 DIAGNOSIS — I69320 Aphasia following cerebral infarction: Secondary | ICD-10-CM | POA: Diagnosis not present

## 2019-10-25 DIAGNOSIS — D689 Coagulation defect, unspecified: Secondary | ICD-10-CM | POA: Diagnosis not present

## 2019-10-25 DIAGNOSIS — M150 Primary generalized (osteo)arthritis: Secondary | ICD-10-CM | POA: Diagnosis not present

## 2019-10-25 DIAGNOSIS — I972 Postmastectomy lymphedema syndrome: Secondary | ICD-10-CM | POA: Diagnosis not present

## 2019-10-25 DIAGNOSIS — L89323 Pressure ulcer of left buttock, stage 3: Secondary | ICD-10-CM | POA: Diagnosis not present

## 2019-10-25 LAB — POCT INR: INR: 2.9 (ref 2.0–3.0)

## 2019-10-25 NOTE — Progress Notes (Signed)
I have reviewed the results and agree with this plan   

## 2019-10-25 NOTE — Progress Notes (Signed)
This patient returns to my office for at risk foot care.  This patient requires this care by a professional since this patient will be at risk due to having venous congestion and coagulation defect.  Patient is taking coumadin. Patient presents to the office today in a wheelchair and with her daughter.  This patient is unable to cut nails herself since the patient cannot reach her nails.These nails are painful walking and wearing shoes.  This patient presents for at risk foot care today.  General Appearance  Alert, conversant and in no acute stress.  Vascular  Dorsalis pedis and posterior tibial  pulses are palpable  bilaterally.  Capillary return is within normal limits  bilaterally. Temperature is within normal limits  bilaterally.  Neurologic  Deferred since the patient is unable to communicate.    Nails Thick disfigured discolored nails with subungual debris  from hallux to fifth toes bilaterally. No evidence of bacterial infection or drainage bilaterally.  Orthopedic  No limitations of motion  feet .  No crepitus or effusions noted.  No bony pathology or digital deformities noted.  Skin  normotropic skin with no porokeratosis noted bilaterally.  No signs of infections or ulcers noted.     Onychomycosis  Pain in right toes  Pain in left toes  Consent was obtained for treatment procedures.   Mechanical debridement of nails 1-5  bilaterally performed with a nail nipper.  Filed with dremel without incident.    Return office visit  3 months                    Told patient to return for periodic foot care and evaluation due to potential at risk complications.   Gardiner Barefoot DPM

## 2019-10-26 ENCOUNTER — Other Ambulatory Visit: Payer: Medicare Other

## 2019-10-26 ENCOUNTER — Ambulatory Visit: Payer: Medicare Other | Admitting: Hematology and Oncology

## 2019-10-28 ENCOUNTER — Other Ambulatory Visit: Payer: Self-pay | Admitting: Internal Medicine

## 2019-10-30 NOTE — Telephone Encounter (Signed)
Check [place] registry last filled 09/25/2019.Marland KitchenJohny Chess

## 2019-11-01 DIAGNOSIS — E0520 Thyrotoxicosis with toxic multinodular goiter without thyrotoxic crisis or storm: Secondary | ICD-10-CM | POA: Diagnosis not present

## 2019-11-01 DIAGNOSIS — M150 Primary generalized (osteo)arthritis: Secondary | ICD-10-CM | POA: Diagnosis not present

## 2019-11-01 DIAGNOSIS — L89323 Pressure ulcer of left buttock, stage 3: Secondary | ICD-10-CM | POA: Diagnosis not present

## 2019-11-01 DIAGNOSIS — L89153 Pressure ulcer of sacral region, stage 3: Secondary | ICD-10-CM | POA: Diagnosis not present

## 2019-11-01 DIAGNOSIS — I972 Postmastectomy lymphedema syndrome: Secondary | ICD-10-CM | POA: Diagnosis not present

## 2019-11-01 DIAGNOSIS — I69351 Hemiplegia and hemiparesis following cerebral infarction affecting right dominant side: Secondary | ICD-10-CM | POA: Diagnosis not present

## 2019-11-01 DIAGNOSIS — Z7901 Long term (current) use of anticoagulants: Secondary | ICD-10-CM | POA: Diagnosis not present

## 2019-11-01 DIAGNOSIS — K589 Irritable bowel syndrome without diarrhea: Secondary | ICD-10-CM | POA: Diagnosis not present

## 2019-11-01 DIAGNOSIS — I69320 Aphasia following cerebral infarction: Secondary | ICD-10-CM | POA: Diagnosis not present

## 2019-11-06 ENCOUNTER — Other Ambulatory Visit: Payer: Self-pay | Admitting: Internal Medicine

## 2019-11-07 ENCOUNTER — Telehealth: Payer: Self-pay

## 2019-11-07 ENCOUNTER — Encounter: Payer: Self-pay | Admitting: Hematology and Oncology

## 2019-11-07 DIAGNOSIS — Z23 Encounter for immunization: Secondary | ICD-10-CM | POA: Diagnosis not present

## 2019-11-07 NOTE — Telephone Encounter (Signed)
Called and left a message asking [name] to have the office that completed the labs fax a copy to Dr. Alvy Bimler. We do not see the lab results in Epic.   Called Dr. Vivi Barrack office and they did not complete any labs.

## 2019-11-09 ENCOUNTER — Ambulatory Visit (INDEPENDENT_AMBULATORY_CARE_PROVIDER_SITE_OTHER): Payer: Medicare Other | Admitting: General Practice

## 2019-11-09 DIAGNOSIS — Z7901 Long term (current) use of anticoagulants: Secondary | ICD-10-CM

## 2019-11-09 DIAGNOSIS — I825Y3 Chronic embolism and thrombosis of unspecified deep veins of proximal lower extremity, bilateral: Secondary | ICD-10-CM | POA: Diagnosis not present

## 2019-11-09 DIAGNOSIS — I69320 Aphasia following cerebral infarction: Secondary | ICD-10-CM | POA: Diagnosis not present

## 2019-11-09 DIAGNOSIS — M150 Primary generalized (osteo)arthritis: Secondary | ICD-10-CM | POA: Diagnosis not present

## 2019-11-09 DIAGNOSIS — I69351 Hemiplegia and hemiparesis following cerebral infarction affecting right dominant side: Secondary | ICD-10-CM | POA: Diagnosis not present

## 2019-11-09 DIAGNOSIS — I972 Postmastectomy lymphedema syndrome: Secondary | ICD-10-CM | POA: Diagnosis not present

## 2019-11-09 DIAGNOSIS — L89153 Pressure ulcer of sacral region, stage 3: Secondary | ICD-10-CM | POA: Diagnosis not present

## 2019-11-09 DIAGNOSIS — L89323 Pressure ulcer of left buttock, stage 3: Secondary | ICD-10-CM | POA: Diagnosis not present

## 2019-11-09 LAB — POCT INR: INR: 3.8 — AB (ref 2.0–3.0)

## 2019-11-09 NOTE — Progress Notes (Signed)
Agree with management.  Sandy Pho J Demauri Advincula, MD  

## 2019-11-09 NOTE — Patient Instructions (Addendum)
Pre visit review using our clinic review tool, if applicable. No additional management support is needed unless otherwise documented below in the visit note.  Hold dosage today and then change dosage and take 1 tablet daily and 1/2 tablet on tuesdays Thursdays and Saturdays.  Re-check in 2 weeks.  Dosing instructions given to  [facility] @ [facility] [phone].  Dosing instructions also given to patient's dtr, [name].

## 2019-11-10 ENCOUNTER — Other Ambulatory Visit: Payer: Self-pay | Admitting: Internal Medicine

## 2019-11-10 DIAGNOSIS — L89153 Pressure ulcer of sacral region, stage 3: Secondary | ICD-10-CM | POA: Diagnosis not present

## 2019-11-10 DIAGNOSIS — K589 Irritable bowel syndrome without diarrhea: Secondary | ICD-10-CM | POA: Diagnosis not present

## 2019-11-10 DIAGNOSIS — Z5181 Encounter for therapeutic drug level monitoring: Secondary | ICD-10-CM | POA: Diagnosis not present

## 2019-11-10 DIAGNOSIS — Z86711 Personal history of pulmonary embolism: Secondary | ICD-10-CM | POA: Diagnosis not present

## 2019-11-10 DIAGNOSIS — S70312D Abrasion, left thigh, subsequent encounter: Secondary | ICD-10-CM | POA: Diagnosis not present

## 2019-11-10 DIAGNOSIS — I69351 Hemiplegia and hemiparesis following cerebral infarction affecting right dominant side: Secondary | ICD-10-CM | POA: Diagnosis not present

## 2019-11-10 DIAGNOSIS — L89323 Pressure ulcer of left buttock, stage 3: Secondary | ICD-10-CM | POA: Diagnosis not present

## 2019-11-10 DIAGNOSIS — I972 Postmastectomy lymphedema syndrome: Secondary | ICD-10-CM | POA: Diagnosis not present

## 2019-11-10 DIAGNOSIS — D473 Essential (hemorrhagic) thrombocythemia: Secondary | ICD-10-CM | POA: Diagnosis not present

## 2019-11-10 DIAGNOSIS — M150 Primary generalized (osteo)arthritis: Secondary | ICD-10-CM | POA: Diagnosis not present

## 2019-11-10 DIAGNOSIS — S30810D Abrasion of lower back and pelvis, subsequent encounter: Secondary | ICD-10-CM | POA: Diagnosis not present

## 2019-11-10 DIAGNOSIS — I69320 Aphasia following cerebral infarction: Secondary | ICD-10-CM | POA: Diagnosis not present

## 2019-11-10 DIAGNOSIS — Z7901 Long term (current) use of anticoagulants: Secondary | ICD-10-CM | POA: Diagnosis not present

## 2019-11-13 DIAGNOSIS — L89153 Pressure ulcer of sacral region, stage 3: Secondary | ICD-10-CM | POA: Diagnosis not present

## 2019-11-13 DIAGNOSIS — E042 Nontoxic multinodular goiter: Secondary | ICD-10-CM | POA: Diagnosis not present

## 2019-11-13 DIAGNOSIS — L89323 Pressure ulcer of left buttock, stage 3: Secondary | ICD-10-CM | POA: Diagnosis not present

## 2019-11-13 DIAGNOSIS — I69359 Hemiplegia and hemiparesis following cerebral infarction affecting unspecified side: Secondary | ICD-10-CM | POA: Diagnosis not present

## 2019-11-13 DIAGNOSIS — L89154 Pressure ulcer of sacral region, stage 4: Secondary | ICD-10-CM | POA: Diagnosis not present

## 2019-11-13 DIAGNOSIS — R5381 Other malaise: Secondary | ICD-10-CM | POA: Diagnosis not present

## 2019-11-14 ENCOUNTER — Ambulatory Visit: Payer: Medicare Other | Admitting: Pulmonary Disease

## 2019-11-15 DIAGNOSIS — M150 Primary generalized (osteo)arthritis: Secondary | ICD-10-CM | POA: Diagnosis not present

## 2019-11-15 DIAGNOSIS — I69351 Hemiplegia and hemiparesis following cerebral infarction affecting right dominant side: Secondary | ICD-10-CM | POA: Diagnosis not present

## 2019-11-15 DIAGNOSIS — I972 Postmastectomy lymphedema syndrome: Secondary | ICD-10-CM | POA: Diagnosis not present

## 2019-11-15 DIAGNOSIS — L89153 Pressure ulcer of sacral region, stage 3: Secondary | ICD-10-CM | POA: Diagnosis not present

## 2019-11-15 DIAGNOSIS — I69320 Aphasia following cerebral infarction: Secondary | ICD-10-CM | POA: Diagnosis not present

## 2019-11-15 DIAGNOSIS — L89323 Pressure ulcer of left buttock, stage 3: Secondary | ICD-10-CM | POA: Diagnosis not present

## 2019-11-16 ENCOUNTER — Ambulatory Visit: Payer: Medicare Other | Admitting: Pulmonary Disease

## 2019-11-16 DIAGNOSIS — D473 Essential (hemorrhagic) thrombocythemia: Secondary | ICD-10-CM | POA: Diagnosis not present

## 2019-11-16 DIAGNOSIS — I69320 Aphasia following cerebral infarction: Secondary | ICD-10-CM | POA: Diagnosis not present

## 2019-11-16 DIAGNOSIS — Z86711 Personal history of pulmonary embolism: Secondary | ICD-10-CM | POA: Diagnosis not present

## 2019-11-16 DIAGNOSIS — L89153 Pressure ulcer of sacral region, stage 3: Secondary | ICD-10-CM | POA: Diagnosis not present

## 2019-11-16 DIAGNOSIS — S30810D Abrasion of lower back and pelvis, subsequent encounter: Secondary | ICD-10-CM | POA: Diagnosis not present

## 2019-11-16 DIAGNOSIS — L89323 Pressure ulcer of left buttock, stage 3: Secondary | ICD-10-CM | POA: Diagnosis not present

## 2019-11-16 DIAGNOSIS — I69351 Hemiplegia and hemiparesis following cerebral infarction affecting right dominant side: Secondary | ICD-10-CM | POA: Diagnosis not present

## 2019-11-16 DIAGNOSIS — M150 Primary generalized (osteo)arthritis: Secondary | ICD-10-CM | POA: Diagnosis not present

## 2019-11-16 DIAGNOSIS — S70312D Abrasion, left thigh, subsequent encounter: Secondary | ICD-10-CM | POA: Diagnosis not present

## 2019-11-16 DIAGNOSIS — Z7901 Long term (current) use of anticoagulants: Secondary | ICD-10-CM

## 2019-11-16 DIAGNOSIS — Z5181 Encounter for therapeutic drug level monitoring: Secondary | ICD-10-CM | POA: Diagnosis not present

## 2019-11-16 DIAGNOSIS — K589 Irritable bowel syndrome without diarrhea: Secondary | ICD-10-CM | POA: Diagnosis not present

## 2019-11-16 DIAGNOSIS — I972 Postmastectomy lymphedema syndrome: Secondary | ICD-10-CM | POA: Diagnosis not present

## 2019-11-21 ENCOUNTER — Other Ambulatory Visit: Payer: Self-pay | Admitting: Hematology and Oncology

## 2019-11-22 ENCOUNTER — Ambulatory Visit (INDEPENDENT_AMBULATORY_CARE_PROVIDER_SITE_OTHER): Payer: Medicare Other | Admitting: General Practice

## 2019-11-22 ENCOUNTER — Telehealth: Payer: Self-pay

## 2019-11-22 DIAGNOSIS — I69351 Hemiplegia and hemiparesis following cerebral infarction affecting right dominant side: Secondary | ICD-10-CM | POA: Diagnosis not present

## 2019-11-22 DIAGNOSIS — I972 Postmastectomy lymphedema syndrome: Secondary | ICD-10-CM | POA: Diagnosis not present

## 2019-11-22 DIAGNOSIS — Z7901 Long term (current) use of anticoagulants: Secondary | ICD-10-CM | POA: Diagnosis not present

## 2019-11-22 DIAGNOSIS — L89153 Pressure ulcer of sacral region, stage 3: Secondary | ICD-10-CM | POA: Diagnosis not present

## 2019-11-22 DIAGNOSIS — M150 Primary generalized (osteo)arthritis: Secondary | ICD-10-CM | POA: Diagnosis not present

## 2019-11-22 DIAGNOSIS — L89323 Pressure ulcer of left buttock, stage 3: Secondary | ICD-10-CM | POA: Diagnosis not present

## 2019-11-22 DIAGNOSIS — I69320 Aphasia following cerebral infarction: Secondary | ICD-10-CM | POA: Diagnosis not present

## 2019-11-22 LAB — POCT INR: INR: 2.1 (ref 2.0–3.0)

## 2019-11-22 NOTE — Telephone Encounter (Signed)
-----   Message from Heath Lark, MD sent at 11/22/2019  9:35 AM EST ----- Regarding: pls call daughter I have not seen her for a while Patient has stroke and has dysphasia She has stage 4 sacral decubitus. Based on last communication from [place], daughter stated someone checked her platelet count and it was very high I never received a copy of the blood count  Today I received a request to renew her hydrea I am not comfortable renewing it in the absence of a recent CBC Can you call her daughter and ask if she ever received a copy of recent CBC?

## 2019-11-22 NOTE — Progress Notes (Signed)
I have reviewed the results and agree with this plan   

## 2019-11-22 NOTE — Patient Instructions (Signed)
Pre visit review using our clinic review tool, if applicable. No additional management support is needed unless otherwise documented below in the visit note.  Continue to take 1 tablet daily and 1/2 tablet on tuesdays Thursdays and Saturdays.  Re-check in 2 weeks.  Dosing instructions given to  [name], RN @ [facility] [phone] Dosing instructions also given to patient's dtr, [name].

## 2019-11-22 NOTE — Telephone Encounter (Signed)
Spoke with patient's daughter, she will have CBC report faxed to [place] today.    Will forward once received.

## 2019-11-23 ENCOUNTER — Telehealth: Payer: Self-pay

## 2019-11-23 NOTE — Telephone Encounter (Signed)
Called regarding outside lab work. Daughter is unable to fax the labs. She will drop the labs results off at [facility] today.

## 2019-11-24 ENCOUNTER — Telehealth: Payer: Self-pay | Admitting: Hematology and Oncology

## 2019-11-24 NOTE — Telephone Encounter (Signed)
I have reviewed a copy of fax report of the patient dated November 01, 2019 I called the patient's daughter and spoke with her and reviewed the test results Her white blood cell count is high, 19.2, predominantly neutrophilia Sedimentation rate and CRP were high The patient has sacral decubitus wound that is being addressed by wound care physician The platelet count on that test report was high at 902 I reviewed with her daughter that the current pattern could be reactive pattern related to nonhealing wound I do not feel comfortable adjusting the dose of the hydroxyurea as a higher dose of treatment could delay wound healing She is in agreement to proceed with similar dose as before and I plan to see her in 3 months for further follow-up She expressed understanding

## 2019-11-28 ENCOUNTER — Telehealth: Payer: Self-pay | Admitting: Hematology and Oncology

## 2019-11-28 NOTE — Telephone Encounter (Signed)
Scheduled appts per 11/19 sch msg. Left voicemail with appt date and time.

## 2019-12-01 DIAGNOSIS — M150 Primary generalized (osteo)arthritis: Secondary | ICD-10-CM | POA: Diagnosis not present

## 2019-12-01 DIAGNOSIS — I972 Postmastectomy lymphedema syndrome: Secondary | ICD-10-CM | POA: Diagnosis not present

## 2019-12-01 DIAGNOSIS — L89323 Pressure ulcer of left buttock, stage 3: Secondary | ICD-10-CM | POA: Diagnosis not present

## 2019-12-01 DIAGNOSIS — I69320 Aphasia following cerebral infarction: Secondary | ICD-10-CM | POA: Diagnosis not present

## 2019-12-01 DIAGNOSIS — L89153 Pressure ulcer of sacral region, stage 3: Secondary | ICD-10-CM | POA: Diagnosis not present

## 2019-12-01 DIAGNOSIS — I69351 Hemiplegia and hemiparesis following cerebral infarction affecting right dominant side: Secondary | ICD-10-CM | POA: Diagnosis not present

## 2019-12-02 DIAGNOSIS — Z20822 Contact with and (suspected) exposure to covid-19: Secondary | ICD-10-CM | POA: Diagnosis not present

## 2019-12-04 ENCOUNTER — Ambulatory Visit (INDEPENDENT_AMBULATORY_CARE_PROVIDER_SITE_OTHER): Payer: Medicare Other | Admitting: Pulmonary Disease

## 2019-12-04 ENCOUNTER — Other Ambulatory Visit: Payer: Self-pay

## 2019-12-04 ENCOUNTER — Encounter: Payer: Self-pay | Admitting: Pulmonary Disease

## 2019-12-04 VITALS — BP 110/60 | HR 76 | Temp 97.2°F | Ht 59.0 in | Wt 160.0 lb

## 2019-12-04 DIAGNOSIS — J9611 Chronic respiratory failure with hypoxia: Secondary | ICD-10-CM | POA: Diagnosis not present

## 2019-12-04 DIAGNOSIS — Z23 Encounter for immunization: Secondary | ICD-10-CM

## 2019-12-04 DIAGNOSIS — J310 Chronic rhinitis: Secondary | ICD-10-CM | POA: Diagnosis not present

## 2019-12-04 DIAGNOSIS — Z0000 Encounter for general adult medical examination without abnormal findings: Secondary | ICD-10-CM | POA: Insufficient documentation

## 2019-12-04 DIAGNOSIS — R1311 Dysphagia, oral phase: Secondary | ICD-10-CM

## 2019-12-04 NOTE — Progress Notes (Signed)
@Patient  ID: Sandy Barrett, female    DOB: 1932/07/24, 86 y.o.   MRN: [id number]  Chief Complaint  Patient presents with  . Follow-up    requalify for oxygen    Referring provider: [name], MD  HPI:  86 year old female never smoker followed in our office for chronic respiratory failure, nocturnal hypoxemia.  Patient initially consulted with our practice after a case of aspiration pneumonia in 2019.  PMH:  hypothyroidism, IBS, wheelchair-bound, history of PE Smoker/ Smoking History: Never smoker Maintenance: None Pt of: Dr. Carlis Abbott  12/04/2019  - Visit   86 year old female never smoker followed in our office for chronic respiratory failure.  She is established with Dr. Carlis Abbott.  Patient's chronic hypoxemic respiratory failure felt to be multifactorial in etiology.  Likely component of chronic aspiration of upper airway secretions.  She is maintained on home O2 of 2 L at night.  Occasionally uses 2 L supplemental oxygen during the day based off of oxygen saturations.  Also has chest PT and oral suctioning at home.  Daughter is primary caregiver.  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  Ongoing physical deconditioning and debility.  Continues to use chest PT as well as oral suctioning.  Patient was last seen in our office in August/2021.  At that time was recommended that she remain on oxygen.  Continue dysphagia 2 diet as well as continuing PPI (Protonix)  Her DME company [employer] reported that she needs to follow-up with [place] to "requalify for oxygen".  We have contacted [employer] for additional information on what this entails.  Patient last had overnight oximetry performed in 2019.  She had a baseline of an oximetry in March/2019 that showed over 10 hours of sleep below 89%.  She had a repeat overnight oximetry on 2 L in April/2019 that showed the 2 L of O2 adequately treated her nocturnal hypoxemia.  Questionaires / Pulmonary Flowsheets:    ACT:  No flowsheet data found.  MMRC: No flowsheet data found.  Epworth:  No flowsheet data found.  Tests:   Chest Imaging: CT chest with contrast 09/26/2018-enlarging multinodular goiter exerting mass-effect severe rightward deviation on the trachea and upper mediastinum.  Cardiomegaly with enlarged pulmonary veins and groundglass opacities diffusely suggestive of pulmonary edema.  Trivial left dependent effusion. Significant left chest collateral circulation anteriorly and laterally.  02/13/2017-echocardiogram-LV ejection fraction 60 to 65%, PAP pressure 45  ONO rec RA 03/31/17 desat <89% x 10h 39 min rec 2lpm hs  ONO on 2lpm (done by [facility] on 04/23/17) was normal  Needs to continue using her 2lpm o2 with sleep   09/04/2018- overnight oximetry on room air- duration 9 hours and 58 minutes, oxygen levels below 88% for 2 hours and 16 minutes  Echocardiogram: LVEF 60 to 21%, normal diastolic function.  Dilated RV.  Normal atria.  Mild MR.    FENO:  No results found for: NITRICOXIDE  PFT: No flowsheet data found.  WALK:  No flowsheet data found.  Imaging: No results found.  Lab Results:  CBC    Component Value Date/Time   WBC 14.7 (H) 04/11/2019 1503   RBC 3.48 (L) 04/11/2019 1503   HGB 11.7 (L) 04/11/2019 1503   HGB 14.1 10/06/2016 1439   HCT 38.3 04/11/2019 1503   HCT 43.5 10/06/2016 1439   PLT 716 (H) 04/11/2019 1503   PLT 259 Clumped Platelets--Appears Adequate 10/06/2016 1439   MCV 110.1 (H) 04/11/2019 1503   MCV 114.2 (H) 10/06/2016  1439   MCH 33.6 04/11/2019 1503   MCHC 30.5 04/11/2019 1503   RDW 17.4 (H) 04/11/2019 1503   RDW 16.2 (H) 10/06/2016 1439   LYMPHSABS 1.1 04/11/2019 1503   LYMPHSABS 1.0 10/06/2016 1439   MONOABS 0.5 04/11/2019 1503   MONOABS 0.5 10/06/2016 1439   EOSABS 0.3 04/11/2019 1503   EOSABS 0.1 10/06/2016 1439   BASOSABS 0.1 04/11/2019 1503   BASOSABS 0.1 10/06/2016 1439    BMET    Component Value Date/Time   NA 141  04/11/2019 1503   NA 142 10/06/2016 1352   K 3.8 04/11/2019 1503   K 4.1 10/06/2016 1352   CL 107 04/11/2019 1503   CO2 27 04/11/2019 1503   CO2 27 10/06/2016 1352   GLUCOSE 102 (H) 04/11/2019 1503   GLUCOSE 90 10/06/2016 1352   BUN 21 04/11/2019 1503   BUN 12.9 10/06/2016 1352   CREATININE 0.54 04/11/2019 1503   CREATININE 0.6 10/06/2016 1352   CALCIUM 8.8 (L) 04/11/2019 1503   CALCIUM 9.4 10/06/2016 1352   GFRNONAA >60 04/11/2019 1503   GFRAA >60 04/11/2019 1503    BNP    Component Value Date/Time   BNP 899.5 (H) 02/12/2017 1531    ProBNP    Component Value Date/Time   PROBNP 69.0 05/24/2017 1625    Specialty Problems      Pulmonary Problems   Tracheal deviation    CT 05/2015 shows trachea deviation from multinodular goiter.       Aspiration pneumonia (HCC)   Hemidiaphragm paralysis   Chronic respiratory failure with hypoxia (HCC)    sats 97% RA at rest 03/11/2017 p admit for asp pna 02/12/17  - ONO rec RA  03/31/17  desat < 89% x 10h 39 min rec 2lpm hs and repeat ono on 2lpm > done 04/24/17 ok   09/04/2018- overnight oximetry on room air- duration 9 hours and 58 minutes, oxygen levels below 88% for 2 hours and 16 minutes      Rhonchi   Chronic rhinitis      Allergies  Allergen Reactions  . Valtrex [Valacyclovir Hcl] Cough    Cough and gagging    Immunization History  Administered Date(s) Administered  . Fluad Quad(high Dose 65+) 10/18/2018, 12/04/2019  . Influenza Split 10/28/2010  . Influenza, High Dose Seasonal PF 11/26/2014, 09/09/2016  . Influenza, Seasonal, Injecte, Preservative Fre 12/24/2011  . Influenza,inj,Quad PF,6+ Mos 08/31/2012, 10/25/2015  . Influenza-Unspecified 08/31/2012, 10/25/2015  . PFIZER SARS-COV-2 Vaccination 02/16/2019, 03/13/2019, 11/07/2019  . Pneumococcal Conjugate-13 11/26/2014  . Pneumococcal Polysaccharide-23 07/22/2010  . Tdap 07/22/2010    Past Medical History:  Diagnosis Date  . ARTHRITIS   . Arthritis   .  Diverticulosis   . Essential thrombocytosis ([place])   . HYPERTENSION   . HYPERTHYROIDISM   . Hyperthyroidism    s/p I-131 ablation 03/2011 of multinod goiter  . INSOMNIA   . MIGRAINE HEADACHE   . OBESITY   . Posttraumatic stress disorder   . Pulmonary embolism ([place]) 03/2014   with DVT  . Stroke [facility]) 03/2014   dysarthria    Tobacco History: Social History   Tobacco Use  Smoking Status Never Smoker  Smokeless Tobacco Never Used   Counseling given: Yes   Continue to not smoke  Outpatient Encounter Medications as of 12/04/2019  Medication Sig  . acetaminophen (TYLENOL) 160 MG/5ML liquid Take 480 mg by mouth every 4 (four) hours as needed for fever or pain.  [name] azelastine (ASTELIN) 0.1 % nasal spray Place  2 sprays into both nostrils 2 (two) times daily. Use in each nostril as directed  . ciclopirox (PENLAC) 8 % solution APPLY TOPICALLY AT BEDTIME OVER NAIL & SURROUNDING SKIN,APPLY DAILY OVER PREVIOUS COAT,AFTER 7 DAYS REMOVE WITH ALCOHOL AND CONTINUE CYCLE  . hydroxyurea (HYDREA) 500 MG capsule TAKE 1 TABLET BY MOUTH DAILY EXCEPT 2 TABLETS ( 1000 MG ) ON WEDNESDAY, SATURDAY AND SUNDAY  . ipratropium-albuterol (DUONEB) 0.5-2.5 (3) MG/3ML SOLN Take 3 mLs by nebulization every 6 (six) hours as needed (shortness of breath).  . lidocaine (LIDODERM) 5 % Place 1 patch daily onto the skin. Remove & Discard patch within 12 hours or as directed by MD (Patient taking differently: Place 1 patch onto the skin daily as needed (pain). Remove & Discard patch within 12 hours or as directed by MD)  . NONFORMULARY OR COMPOUNDED ITEM Topical cream with equal parts diclofenac, gabapentin, lidocaine, menthol  Disp 100 gm  . Oral Hygiene Products (Q-CARE COVERD YANKAUER/SUCTION) MISC Use as directed  . OXYGEN 2lpm 24/7  DME- [facility]  . polyethylene glycol (MIRALAX / GLYCOLAX) packet TAKE 17G BY MOUTH TWICE A DAY (Patient taking differently: Take 17 g by mouth 2 (two) times daily as needed for mild constipation.  As needed)  . pregabalin (LYRICA) 50 MG capsule TAKE 1 CAPSULE BY MOUTH THREE TIMES A DAY  . Probiotic Product (ALIGN) 4 MG CAPS Take 4 mg by mouth daily.   . propranolol (INDERAL) 10 MG tablet TAKE 0.5 TABLETS (5 MG TOTAL) BY MOUTH 2 (TWO) TIMES DAILY.  [name] PROTONIX 40 MG PACK TAKE 20 MLS (40 MG TOTAL) BY MOUTH 2 (TWO) TIMES DAILY.  . traMADol (ULTRAM) 50 MG tablet TAKE 2 TABLETS BY MOUTH 3 TIMES A DAY AS NEEDED  . traZODone (DESYREL) 50 MG tablet TAKE 1 TABLET (50 MG TOTAL) BY MOUTH AT BEDTIME AS NEEDED. FOR SLEEP  . VOLTAREN 1 % GEL APPLY 2 G TOPICALLY 3 (THREE) TIMES DAILY AS NEEDED.  [name] warfarin (COUMADIN) 4 MG tablet TAKE 1 TABLET DAILY EXCEPT 1/2 ON SUNDAY AND THURSDAYS OR TAKE AS DIRECTED BY ANTICOAGULATION CLINIC   No facility-administered encounter medications on file as of 12/04/2019.     Review of Systems  Review of Systems  Constitutional: Negative for activity change, fatigue and fever.  HENT: Positive for postnasal drip and rhinorrhea. Negative for sinus pressure, sinus pain and sore throat.   Respiratory: Negative for cough, shortness of breath and wheezing.   Cardiovascular: Negative for chest pain and palpitations.  Gastrointestinal: Negative for diarrhea, nausea and vomiting.  Musculoskeletal: Negative for arthralgias.  Neurological: Negative for dizziness.  Psychiatric/Behavioral: Negative for sleep disturbance. The patient is not nervous/anxious.      Physical Exam  BP 110/60 (BP Location: Left Arm, Patient Position: Sitting, Cuff Size: Normal)   Pulse 76   Temp (!) 97.2 F (36.2 C) (Temporal)   Ht 4\' 11"  (1.499 m)   Wt 160 lb (72.6 kg)   SpO2 98%   BMI 32.32 kg/m   Wt Readings from Last 5 Encounters:  12/04/19 160 lb (72.6 kg)  08/15/19 176 lb (79.8 kg)  08/10/19 176 lb (79.8 kg)  11/23/18 176 lb (79.8 kg)  08/30/18 176 lb (79.8 kg)    BMI Readings from Last 5 Encounters:  12/04/19 32.32 kg/m  08/21/19 30.21 kg/m  08/15/19 30.21 kg/m  08/10/19  30.21 kg/m  05/11/19 34.37 kg/m     Physical Exam Vitals and nursing note reviewed.  Constitutional:  General: She is not in acute distress.    Appearance: Normal appearance. She is obese.  HENT:     Head: Normocephalic and atraumatic.     Right Ear: External ear normal.     Left Ear: External ear normal.     Nose: Nose normal. No congestion.     Mouth/Throat:     Mouth: Mucous membranes are moist.     Pharynx: Oropharynx is clear.  Eyes:     Pupils: Pupils are equal, round, and reactive to light.  Cardiovascular:     Rate and Rhythm: Normal rate and regular rhythm.     Pulses: Normal pulses.     Heart sounds: Normal heart sounds. No murmur heard.   Pulmonary:     Breath sounds: Normal breath sounds. No decreased air movement. No decreased breath sounds, wheezing or rales.  Musculoskeletal:     Cervical back: Normal range of motion.  Skin:    General: Skin is warm and dry.     Capillary Refill: Capillary refill takes less than 2 seconds.  Neurological:     General: No focal deficit present.     Mental Status: She is alert and oriented to person, place, and time. Mental status is at baseline.     Gait: Gait normal.  Psychiatric:        Mood and Affect: Mood normal.        Behavior: Behavior normal.        Thought Content: Thought content normal.        Judgment: Judgment normal.       Assessment & Plan:   Chronic respiratory failure with hypoxia (HCC) Plan: Continue oxygen therapy as prescribed Follow-up with our office in 4 months Notify our office if you have worsening cough, congestion, increased oxygen needs Have contacted [employer] Representative Melissa to see what additional documentation is needed as patient has nocturnal overnight oximetry that showed need for oxygen in 2019 and in 2020.  Patient requires 2 L of O2 at night  Chronic rhinitis Plan: Start daily antihistamine Continue Astelin nasal spray  Healthcare maintenance High-dose flu  vaccine today  Dysphagia Plan: Continue dysphagia 2 diet Continue to monitor for aspiration    Return in about 4 months (around 04/02/2020), or if symptoms worsen or fail to improve, for Follow up with Dr. Silas Flood, [place].   Lauraine Rinne, NP 12/04/2019   This appointment required 32 minutes of patient care (this includes precharting, chart review, review of results, face-to-face care, etc.).

## 2019-12-04 NOTE — Assessment & Plan Note (Signed)
Plan: Continue dysphagia 2 diet Continue to monitor for aspiration

## 2019-12-04 NOTE — Assessment & Plan Note (Addendum)
Plan: Continue oxygen therapy as prescribed Follow-up with our office in 4 months Notify our office if you have worsening cough, congestion, increased oxygen needs Have contacted [employer] Representative Melissa to see what additional documentation is needed as patient has nocturnal overnight oximetry that showed need for oxygen in 2019 and in 2020.  Patient requires 2 L of O2 at night

## 2019-12-04 NOTE — Assessment & Plan Note (Signed)
High-dose flu vaccine today 

## 2019-12-04 NOTE — Patient Instructions (Addendum)
You were seen today by Lauraine Rinne, NP  for:   1. Chronic respiratory failure with hypoxia ([place])  I have contacted [employer] to figure out exactly with documentation as needed  If we need to repeat an overnight oximetry test then we will do so  Continue oxygen therapy as prescribed  >>>maintain oxygen saturations greater than 88 percent  >>>if unable to maintain oxygen saturations please contact the office  >>>do not smoke with oxygen  >>>can use nasal saline gel or nasal saline rinses to moisturize nose if oxygen causes dryness   2. Chronic rhinitis  Please start taking a daily antihistamine:  >>>choose one of: zyrtec, claritin, allegra, or xyzal  >>>these are over the counter medications  >>>can choose generic option  >>>take daily  >>>this medication helps with allergies, post nasal drip, and cough    3. Healthcare maintenance  High dose flu vaccine   Follow Up:    Return in about 4 months (around 04/02/2020), or if symptoms worsen or fail to improve, for Follow up with Dr. Silas Flood, [place].   Notification of test results are managed in the following manner: If there are  any recommendations or changes to the  plan of care discussed in office today,  we will contact you and let you know what they are. If you do not hear from [place], then your results are normal and you can view them through your  MyChart account , or a letter will be sent to you. Thank you again for trusting [place] with your care  - Thank you, [facility]    It is flu season:   >>> Best ways to protect herself from the flu: Receive the yearly flu vaccine, practice good hand hygiene washing with soap and also using hand sanitizer when available, eat a nutritious meals, get adequate rest, hydrate appropriately       Please contact the office if your symptoms worsen or you have concerns that you are not improving.   Thank you for choosing [facility] for your healthcare, and for  allowing [place] to partner with you on your healthcare journey. I am thankful to be able to provide care to you today.   Wyn Quaker FNP-C   Influenza Virus Vaccine injection What is this medicine? INFLUENZA VIRUS VACCINE (in floo EN zuh VAHY ruhs vak SEEN) helps to reduce the risk of getting influenza also known as the flu. The vaccine only helps protect you against some strains of the flu. This medicine may be used for other purposes; ask your health care provider or pharmacist if you have questions. COMMON BRAND NAME(S): Afluria, Afluria Quadrivalent, Agriflu, Alfuria, FLUAD, Fluarix, Fluarix Quadrivalent, Flublok, Flublok Quadrivalent, FLUCELVAX, FLUCELVAX Quadrivalent, Flulaval, Flulaval Quadrivalent, Fluvirin, Fluzone, Fluzone High-Dose, Fluzone Intradermal, Fluzone Quadrivalent What should I tell my health care provider before I take this medicine? They need to know if you have any of these conditions:  bleeding disorder like hemophilia  fever or infection  Guillain-Barre syndrome or other neurological problems  immune system problems  infection with the human immunodeficiency virus (HIV) or AIDS  low blood platelet counts  multiple sclerosis  an unusual or allergic reaction to influenza virus vaccine, latex, other medicines, foods, dyes, or preservatives. Different brands of vaccines contain different allergens. Some may contain latex or eggs. Talk to your doctor about your allergies to make sure that you get the right vaccine.  pregnant or trying to get pregnant  breast-feeding How should I use  this medicine? This vaccine is for injection into a muscle or under the skin. It is given by a health care professional. A copy of Vaccine Information Statements will be given before each vaccination. Read this sheet carefully each time. The sheet may change frequently. Talk to your healthcare provider to see which vaccines are right for you. Some vaccines should not be used in all age  groups. Overdosage: If you think you have taken too much of this medicine contact a poison control center or emergency room at once. NOTE: This medicine is only for you. Do not share this medicine with others. What if I miss a dose? This does not apply. What may interact with this medicine?  chemotherapy or radiation therapy  medicines that lower your immune system like etanercept, anakinra, infliximab, and adalimumab  medicines that treat or prevent blood clots like warfarin  phenytoin  steroid medicines like prednisone or cortisone  theophylline  vaccines This list may not describe all possible interactions. Give your health care provider a list of all the medicines, herbs, non-prescription drugs, or dietary supplements you use. Also tell them if you smoke, drink alcohol, or use illegal drugs. Some items may interact with your medicine. What should I watch for while using this medicine? Report any side effects that do not go away within 3 days to your doctor or health care professional. Call your health care provider if any unusual symptoms occur within 6 weeks of receiving this vaccine. You may still catch the flu, but the illness is not usually as bad. You cannot get the flu from the vaccine. The vaccine will not protect against colds or other illnesses that may cause fever. The vaccine is needed every year. What side effects may I notice from receiving this medicine? Side effects that you should report to your doctor or health care professional as soon as possible:  allergic reactions like skin rash, itching or hives, swelling of the face, lips, or tongue Side effects that usually do not require medical attention (report to your doctor or health care professional if they continue or are bothersome):  fever  headache  muscle aches and pains  pain, tenderness, redness, or swelling at the injection site  tiredness This list may not describe all possible side effects. Call  your doctor for medical advice about side effects. You may report side effects to FDA at 1-800-FDA-1088. Where should I keep my medicine? The vaccine will be given by a health care professional in a clinic, pharmacy, doctor's office, or other health care setting. You will not be given vaccine doses to store at home. NOTE: This sheet is a summary. It may not cover all possible information. If you have questions about this medicine, talk to your doctor, pharmacist, or health care provider.  2020 Elsevier/Gold Standard (2017-11-16 08:45:43)

## 2019-12-04 NOTE — Assessment & Plan Note (Signed)
Plan: Start daily antihistamine Continue Astelin nasal spray

## 2019-12-05 ENCOUNTER — Encounter: Payer: Self-pay | Admitting: Internal Medicine

## 2019-12-05 NOTE — Progress Notes (Signed)
Subjective:    Patient ID: Sandy Barrett, female    DOB: [birth year], 86 y.o.   MRN: [id number]  HPI The patient is here for an acute visit for low appetite, fatigue.  Her daughter brings her here to the appointment and she provides the history due to the patient's inability to communicate.   The day before thanksgiving she did not feel like eating.  She ate 1/2 her dinner.  She has not wanted to eat since then.  Yesterday it was a little better.  Her daughter needs to remind her to eat more.  She has been sleeping more.  She falls asleep while eating.  She looks foggy.   No changes in urine.  When she is eating she has a lot of PND.  That makes it harder for her to swelling.  She stuffs food back in her mouth and the food gets stuck in her mouth.  Her dauhter had to pull out some foodfrom her mouth.  She is forgetting things - she knows if she can not swallow soething she will take it out - but last night she did not seem to remember how to do that.  Increased food getting cut.     Reviewed labs from [facility] from October and wound care notes from [facility]  Medications and allergies reviewed with patient and updated if appropriate.  Patient Active Problem List   Diagnosis Date Noted   Healthcare maintenance 12/04/2019   Chronic rhinitis 12/04/2019   Rhonchi 05/11/2019   Urinary incontinence without sensory awareness 04/26/2019   GERD (gastroesophageal reflux disease) 02/12/2019   Decubitus ulcer of back, unstageable ([place]) 10/25/2018   Right breast lymphedema 10/18/2018   Sleep difficulties 05/24/2018   Right shoulder pain 11/24/2017   Hallucinations 11/24/2017   Adhesive capsulitis of right shoulder 11/23/2017   Bilateral leg edema 05/24/2017   Cholecystitis 02/23/2017   Chronic respiratory failure with hypoxia ([place]) 02/23/2017   Neuralgia 01/09/2017   Venous congestion 10/08/2016   Lymphedema of breast 09/11/2016   Long term (current) use of anticoagulants  [Z79.01] 09/10/2016   Left hip pain 08/09/2016   Left leg swelling 05/18/2016   Pressure ulcer 04/27/2016   Aspiration pneumonia ([place]) 04/20/2016   Hemidiaphragm paralysis    Wheelchair bound 02/03/2016   Discoloration of skin 09/26/2015   Essential thrombocytosis ([place]) 07/31/2015   SVC (superior vena cava obstruction), chronic 07/20/2015   Dysphagia 07/20/2015   Constipation 07/20/2015   Thrombophlebitis of breast, right 06/18/2015   Tracheal deviation 06/06/2015   Sensorineural hearing loss, bilateral, moderate-moderately severe 04/30/2015   Abdominal wall lump 03/09/2015   Frequent UTI 02/06/2015   Primary osteoarthritis involving multiple joints 07/26/2014   Pernicious anemia 07/26/2014   Encounter for therapeutic drug monitoring 07/17/2014   Spastic hemiparesis affecting dominant side ([place]), right 05/31/2014   DVT of lower extremity, bilateral ([place]) 03/14/2014   Global aphasia 03/14/2014   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 03/14/2014   Aphasia S/P CVA 03/13/2014   Cerebral infarction due to embolism of left middle cerebral artery (HCC)    Stroke, embolic ([place]) 92/33/0076   History of pulmonary embolism 03/08/2014   Primary localized osteoarthrosis, lower leg 03/06/2013   IBS (irritable bowel syndrome)    Multinodular goiter 01/13/2011   Hyperthyroidism 11/11/2009   Osteoarthritis 04/21/2008   Migraine headache 04/20/2008    Current Outpatient Medications on File Prior to Visit  Medication Sig Dispense Refill   acetaminophen (TYLENOL) 160 MG/5ML liquid Take 480 mg by mouth every 4 (four)  hours as needed for fever or pain.     azelastine (ASTELIN) 0.1 % nasal spray Place 2 sprays into both nostrils 2 (two) times daily. Use in each nostril as directed 30 mL 11   ciclopirox (PENLAC) 8 % solution APPLY TOPICALLY AT BEDTIME OVER NAIL & SURROUNDING SKIN,APPLY DAILY OVER PREVIOUS COAT,AFTER 7 DAYS REMOVE WITH ALCOHOL AND CONTINUE CYCLE 6.6 mL 5    hydroxyurea (HYDREA) 500 MG capsule TAKE 1 TABLET BY MOUTH DAILY EXCEPT 2 TABLETS ( 1000 MG ) ON WEDNESDAY, SATURDAY AND SUNDAY 120 capsule 5   ipratropium-albuterol (DUONEB) 0.5-2.5 (3) MG/3ML SOLN Take 3 mLs by nebulization every 6 (six) hours as needed (shortness of breath). 360 mL 3   lidocaine (LIDODERM) 5 % Place 1 patch daily onto the skin. Remove & Discard patch within 12 hours or as directed by MD (Patient taking differently: Place 1 patch onto the skin daily as needed (pain). Remove & Discard patch within 12 hours or as directed by MD) 15 patch 0   NONFORMULARY OR COMPOUNDED ITEM Topical cream with equal parts diclofenac, gabapentin, lidocaine, menthol  Disp 100 gm 1 each 5   Oral Hygiene Products (Q-CARE COVERD YANKAUER/SUCTION) MISC Use as directed 10 each 5   OXYGEN 2lpm 24/7  DME- [facility]     polyethylene glycol (MIRALAX / GLYCOLAX) packet TAKE 17G BY MOUTH TWICE A DAY (Patient taking differently: Take 17 g by mouth 2 (two) times daily as needed for mild constipation. As needed) 14 packet 1   pregabalin (LYRICA) 50 MG capsule TAKE 1 CAPSULE BY MOUTH THREE TIMES A DAY 90 capsule 1   Probiotic Product (ALIGN) 4 MG CAPS Take 4 mg by mouth daily.      propranolol (INDERAL) 10 MG tablet TAKE 0.5 TABLETS (5 MG TOTAL) BY MOUTH 2 (TWO) TIMES DAILY. 60 tablet 2   PROTONIX 40 MG PACK TAKE 20 MLS (40 MG TOTAL) BY MOUTH 2 (TWO) TIMES DAILY. 18 mL 3   traMADol (ULTRAM) 50 MG tablet TAKE 2 TABLETS BY MOUTH 3 TIMES A DAY AS NEEDED 180 tablet 1   traZODone (DESYREL) 50 MG tablet TAKE 1 TABLET (50 MG TOTAL) BY MOUTH AT BEDTIME AS NEEDED. FOR SLEEP 90 tablet 1   VOLTAREN 1 % GEL APPLY 2 G TOPICALLY 3 (THREE) TIMES DAILY AS NEEDED. 300 g 5   warfarin (COUMADIN) 4 MG tablet TAKE 1 TABLET DAILY EXCEPT 1/2 ON SUNDAY AND THURSDAYS OR TAKE AS DIRECTED BY ANTICOAGULATION CLINIC 90 tablet 1   No current facility-administered medications on file prior to visit.    Past Medical History:  Diagnosis  Date   ARTHRITIS    Arthritis    Diverticulosis    Essential thrombocytosis ([place])    HYPERTENSION    HYPERTHYROIDISM    Hyperthyroidism    s/p I-131 ablation 03/2011 of multinod goiter   INSOMNIA    MIGRAINE HEADACHE    OBESITY    Posttraumatic stress disorder    Pulmonary embolism ([place]) 03/2014   with DVT   Stroke ([place]) 03/2014   dysarthria    Past Surgical History:  Procedure Laterality Date   ABDOMINAL HYSTERECTOMY  1976   BREAST SURGERY     biopsy   RADIOLOGY WITH ANESTHESIA Left 03/08/2014   Procedure: RADIOLOGY WITH ANESTHESIA;  Surgeon: Rob Hickman, MD;  Location: [facility];  Service: Radiology;  Laterality: Left;   TONSILLECTOMY     TOTAL HIP ARTHROPLASTY  1998    right    Social History  Socioeconomic History   Marital status: Widowed    Spouse name: Not on file   Number of children: 2   Years of education: 14   Highest education level: Not on file  Occupational History   Occupation: retired    Comment: Restaurant manager, fast food  Tobacco Use   Smoking status: Never Smoker   Smokeless tobacco: Never Used  Substance and Sexual Activity   Alcohol use: No    Alcohol/week: 0.0 standard drinks   Drug use: No   Sexual activity: Never  Other Topics Concern   Not on file  Social History Narrative   Widowed, lived alone prior to CVA 03/2014   SNF at [facility], then home 07/2014 with dtr   Right handed   Caffeine use- occasionally drinks tea   Social Determinants of Health   Financial Resource Strain:    Difficulty of Paying Living Expenses: Not on file  Food Insecurity:    Worried About Charity fundraiser in the Last Year: Not on file   [employer] of Food in the Last Year: Not on file  Transportation Needs:    Lack of Transportation (Medical): Not on file   Lack of Transportation (Non-Medical): Not on file  Physical Activity:    Days of Exercise per Week: Not on file   Minutes of Exercise per Session: Not on file   Stress:    Feeling of Stress : Not on file  Social Connections:    Frequency of Communication with Friends and Family: Not on file   Frequency of Social Gatherings with Friends and Family: Not on file   Attends Religious Services: Not on file   Active Member of Clubs or Organizations: Not on file   Attends Archivist Meetings: Not on file   Marital Status: Not on file    Family History  Problem Relation Age of Onset   Asthma Mother    Asthma Father    Prostate cancer Father    Stroke Brother    Breast cancer Sister    Stomach cancer Sister    Thyroid disease Neg Hx    Review of systems obtained from daughter.  Unable to obtain further review of systems from patient due to aphasia. Review of Systems  Constitutional: Positive for appetite change.  Respiratory: Positive for cough (only once in a while).   Genitourinary:       No change in urine odor  Musculoskeletal: Positive for arthralgias.  Psychiatric/Behavioral:       Looking dazed       Objective:   Vitals:   12/06/19 1115  BP: 114/72  Pulse: 87  Temp: 98.2 F (36.8 C)  SpO2: 97%   BP Readings from Last 3 Encounters:  12/06/19 114/72  12/04/19 110/60  08/21/19 116/72   Wt Readings from Last 3 Encounters:  12/04/19 160 lb (72.6 kg)  08/15/19 176 lb (79.8 kg)  08/10/19 176 lb (79.8 kg)   Body mass index is 32.32 kg/m.   Physical Exam Constitutional:      Appearance: Normal appearance.     Comments: Looks tired, easily falling asleep and is sleeping most of the visit.  Easily aroused.  Looks slightly uncomfortable  HENT:     Head: Normocephalic and atraumatic.     Mouth/Throat:     Comments: Moist.  She still had some retained fluid from her milkshake 50 minutes ago Cardiovascular:     Rate and Rhythm: Tachycardia present. Rhythm irregular.  Heart sounds: Murmur (2/6 systolic) heard.   Pulmonary:     Effort: No respiratory distress.     Breath sounds: No stridor. Rales  (Bibasilar) present. No wheezing.     Comments: Decreased effort Abdominal:     General: There is no distension.     Palpations: Abdomen is soft.     Tenderness: There is no abdominal tenderness. There is no guarding or rebound.  Musculoskeletal:     Cervical back: Neck supple. No tenderness.     Right lower leg: Edema (1+) present.     Left lower leg: Edema (Mild) present.  Lymphadenopathy:     Cervical: No cervical adenopathy.  Skin:    General: Skin is warm and dry.     Findings: No erythema.            Assessment & Plan:    See Problem List for Assessment and Plan of chronic medical problems.    This visit occurred during the SARS-CoV-2 public health emergency.  Safety protocols were in place, including screening questions prior to the visit, additional usage of staff PPE, and extensive cleaning of exam room while observing appropriate contact time as indicated for disinfecting solutions.

## 2019-12-06 ENCOUNTER — Encounter: Payer: Self-pay | Admitting: Internal Medicine

## 2019-12-06 ENCOUNTER — Other Ambulatory Visit: Payer: Self-pay

## 2019-12-06 ENCOUNTER — Ambulatory Visit (INDEPENDENT_AMBULATORY_CARE_PROVIDER_SITE_OTHER): Payer: Medicare Other | Admitting: Internal Medicine

## 2019-12-06 ENCOUNTER — Ambulatory Visit (INDEPENDENT_AMBULATORY_CARE_PROVIDER_SITE_OTHER): Payer: Medicare Other

## 2019-12-06 VITALS — BP 114/72 | HR 87 | Temp 98.2°F | Ht 59.0 in

## 2019-12-06 DIAGNOSIS — R059 Cough, unspecified: Secondary | ICD-10-CM | POA: Diagnosis not present

## 2019-12-06 DIAGNOSIS — L89100 Pressure ulcer of unspecified part of back, unstageable: Secondary | ICD-10-CM

## 2019-12-06 DIAGNOSIS — N390 Urinary tract infection, site not specified: Secondary | ICD-10-CM

## 2019-12-06 DIAGNOSIS — R5381 Other malaise: Secondary | ICD-10-CM

## 2019-12-06 DIAGNOSIS — R1310 Dysphagia, unspecified: Secondary | ICD-10-CM | POA: Diagnosis not present

## 2019-12-06 DIAGNOSIS — J90 Pleural effusion, not elsewhere classified: Secondary | ICD-10-CM | POA: Diagnosis not present

## 2019-12-06 LAB — CBC WITH DIFFERENTIAL/PLATELET
Basophils Absolute: 0.1 10*3/uL (ref 0.0–0.1)
Basophils Relative: 0.5 % (ref 0.0–3.0)
Eosinophils Absolute: 0.4 10*3/uL (ref 0.0–0.7)
Eosinophils Relative: 1.8 % (ref 0.0–5.0)
HCT: 42.9 % (ref 36.0–46.0)
Hemoglobin: 13.5 g/dL (ref 12.0–15.0)
Lymphocytes Relative: 6.2 % — ABNORMAL LOW (ref 12.0–46.0)
Lymphs Abs: 1.4 10*3/uL (ref 0.7–4.0)
MCHC: 31.4 g/dL (ref 30.0–36.0)
MCV: 101.2 fl — ABNORMAL HIGH (ref 78.0–100.0)
Monocytes Absolute: 0.4 10*3/uL (ref 0.1–1.0)
Monocytes Relative: 1.6 % — ABNORMAL LOW (ref 3.0–12.0)
Neutro Abs: 20.3 10*3/uL — ABNORMAL HIGH (ref 1.4–7.7)
Neutrophils Relative %: 89.9 % — ABNORMAL HIGH (ref 43.0–77.0)
Platelets: 868 10*3/uL — ABNORMAL HIGH (ref 150.0–400.0)
RBC: 4.24 Mil/uL (ref 3.87–5.11)
RDW: 18.4 % — ABNORMAL HIGH (ref 11.5–15.5)
WBC: 22.6 10*3/uL (ref 4.0–10.5)

## 2019-12-06 LAB — COMPREHENSIVE METABOLIC PANEL
ALT: 12 U/L (ref 0–35)
AST: 20 U/L (ref 0–37)
Albumin: 3.3 g/dL — ABNORMAL LOW (ref 3.5–5.2)
Alkaline Phosphatase: 83 U/L (ref 39–117)
BUN: 22 mg/dL (ref 6–23)
CO2: 25 mEq/L (ref 19–32)
Calcium: 9.5 mg/dL (ref 8.4–10.5)
Chloride: 114 mEq/L — ABNORMAL HIGH (ref 96–112)
Creatinine, Ser: 0.42 mg/dL (ref 0.40–1.20)
GFR: 89.27 mL/min (ref >60.00)
Glucose, Bld: 91 mg/dL (ref 70–99)
Potassium: 3.5 mEq/L (ref 3.5–5.1)
Sodium: 147 mEq/L — ABNORMAL HIGH (ref 135–145)
Total Bilirubin: 0.5 mg/dL (ref 0.2–1.2)
Total Protein: 7.8 g/dL (ref 6.0–8.3)

## 2019-12-06 LAB — SEDIMENTATION RATE: Sed Rate: 73 mm/hr — ABNORMAL HIGH (ref 0–30)

## 2019-12-06 LAB — C-REACTIVE PROTEIN: CRP: 11.5 mg/dL (ref 0.5–20.0)

## 2019-12-06 NOTE — Patient Instructions (Signed)
Have blood work and a chest xray done today.    We will determine treatment or further evaluation based on the results.

## 2019-12-06 NOTE — Assessment & Plan Note (Signed)
Chronic Dysphagia to diet History of aspiration pneumonia and high risk for aspiration pneumonia Recently has not been swallowing as she should and has seemed to have forgotten how to remove food from her mouth if she is not able to swallow it ?  Cause-infections/confusion, worsening of thyroid goiter, new CVA, fatigue  Will evaluate further with chest x-ray and blood work Advise daughter to monitor carefully because of her increased risk of aspiration pneumonia

## 2019-12-06 NOTE — Assessment & Plan Note (Signed)
Acute Increased fatigue and is somewhat unsettled ?  Related to infection such as aspiration pneumonia No change in urine symptoms so unlikely UTI ?  Decubitus ulcers-?  Infection has had chronically elevated leukocytosis and thrombocytosis which are likely reactive-we will recheck since these did get worse from July to October

## 2019-12-06 NOTE — Assessment & Plan Note (Signed)
Chronic, intermittent Not necessarily more than usual, but given her change in swallowing, fatigue and risk for aspiration pneumonia will check chest x-ray to rule out infection

## 2019-12-06 NOTE — Assessment & Plan Note (Signed)
Chronic Following with [facility] Has worsening leukocytosis and thrombocytosis, elevated ESR, CRP that is worse the end of October compared to July We will recheck CBC, CMP, ESR and CRP ?  Infected ulcer versus osteomyelitis versus other

## 2019-12-07 ENCOUNTER — Emergency Department (HOSPITAL_COMMUNITY): Payer: Medicare Other

## 2019-12-07 ENCOUNTER — Inpatient Hospital Stay (HOSPITAL_COMMUNITY)
Admission: EM | Admit: 2019-12-07 | Discharge: 2019-12-13 | DRG: 689 | Disposition: A | Discharge status: Home Health | Payer: Medicare Other | Attending: Internal Medicine | Admitting: Internal Medicine

## 2019-12-07 ENCOUNTER — Encounter (HOSPITAL_COMMUNITY): Payer: Self-pay | Admitting: Emergency Medicine

## 2019-12-07 ENCOUNTER — Other Ambulatory Visit: Payer: Self-pay

## 2019-12-07 ENCOUNTER — Telehealth: Payer: Self-pay | Admitting: Internal Medicine

## 2019-12-07 DIAGNOSIS — N390 Urinary tract infection, site not specified: Principal | ICD-10-CM | POA: Diagnosis present

## 2019-12-07 DIAGNOSIS — R7989 Other specified abnormal findings of blood chemistry: Secondary | ICD-10-CM | POA: Diagnosis present

## 2019-12-07 DIAGNOSIS — R000 Tachycardia, unspecified: Secondary | ICD-10-CM | POA: Diagnosis not present

## 2019-12-07 DIAGNOSIS — R29818 Other symptoms and signs involving the nervous system: Secondary | ICD-10-CM | POA: Diagnosis not present

## 2019-12-07 DIAGNOSIS — L8990 Pressure ulcer of unspecified site, unspecified stage: Secondary | ICD-10-CM | POA: Diagnosis present

## 2019-12-07 DIAGNOSIS — L98429 Non-pressure chronic ulcer of back with unspecified severity: Secondary | ICD-10-CM

## 2019-12-07 DIAGNOSIS — I10 Essential (primary) hypertension: Secondary | ICD-10-CM | POA: Diagnosis not present

## 2019-12-07 DIAGNOSIS — I63412 Cerebral infarction due to embolism of left middle cerebral artery: Secondary | ICD-10-CM | POA: Diagnosis present

## 2019-12-07 DIAGNOSIS — Z79899 Other long term (current) drug therapy: Secondary | ICD-10-CM

## 2019-12-07 DIAGNOSIS — L89154 Pressure ulcer of sacral region, stage 4: Secondary | ICD-10-CM | POA: Diagnosis not present

## 2019-12-07 DIAGNOSIS — Z825 Family history of asthma and other chronic lower respiratory diseases: Secondary | ICD-10-CM

## 2019-12-07 DIAGNOSIS — R0902 Hypoxemia: Secondary | ICD-10-CM | POA: Diagnosis not present

## 2019-12-07 DIAGNOSIS — Z90710 Acquired absence of both cervix and uterus: Secondary | ICD-10-CM

## 2019-12-07 DIAGNOSIS — I639 Cerebral infarction, unspecified: Secondary | ICD-10-CM | POA: Diagnosis not present

## 2019-12-07 DIAGNOSIS — I517 Cardiomegaly: Secondary | ICD-10-CM | POA: Diagnosis not present

## 2019-12-07 DIAGNOSIS — R059 Cough, unspecified: Secondary | ICD-10-CM | POA: Diagnosis not present

## 2019-12-07 DIAGNOSIS — L89303 Pressure ulcer of unspecified buttock, stage 3: Secondary | ICD-10-CM | POA: Diagnosis present

## 2019-12-07 DIAGNOSIS — R531 Weakness: Secondary | ICD-10-CM

## 2019-12-07 DIAGNOSIS — J189 Pneumonia, unspecified organism: Secondary | ICD-10-CM | POA: Diagnosis not present

## 2019-12-07 DIAGNOSIS — Z9981 Dependence on supplemental oxygen: Secondary | ICD-10-CM

## 2019-12-07 DIAGNOSIS — A419 Sepsis, unspecified organism: Secondary | ICD-10-CM | POA: Diagnosis present

## 2019-12-07 DIAGNOSIS — Z823 Family history of stroke: Secondary | ICD-10-CM

## 2019-12-07 DIAGNOSIS — Z8042 Family history of malignant neoplasm of prostate: Secondary | ICD-10-CM

## 2019-12-07 DIAGNOSIS — Z803 Family history of malignant neoplasm of breast: Secondary | ICD-10-CM

## 2019-12-07 DIAGNOSIS — Z86711 Personal history of pulmonary embolism: Secondary | ICD-10-CM

## 2019-12-07 DIAGNOSIS — Z7401 Bed confinement status: Secondary | ICD-10-CM

## 2019-12-07 DIAGNOSIS — E042 Nontoxic multinodular goiter: Secondary | ICD-10-CM | POA: Diagnosis present

## 2019-12-07 DIAGNOSIS — E860 Dehydration: Secondary | ICD-10-CM | POA: Diagnosis present

## 2019-12-07 DIAGNOSIS — D75839 Thrombocytosis, unspecified: Secondary | ICD-10-CM | POA: Diagnosis present

## 2019-12-07 DIAGNOSIS — Z7901 Long term (current) use of anticoagulants: Secondary | ICD-10-CM

## 2019-12-07 DIAGNOSIS — I69351 Hemiplegia and hemiparesis following cerebral infarction affecting right dominant side: Secondary | ICD-10-CM | POA: Diagnosis not present

## 2019-12-07 DIAGNOSIS — E0590 Thyrotoxicosis, unspecified without thyrotoxic crisis or storm: Secondary | ICD-10-CM | POA: Diagnosis present

## 2019-12-07 DIAGNOSIS — Z20822 Contact with and (suspected) exposure to covid-19: Secondary | ICD-10-CM | POA: Diagnosis not present

## 2019-12-07 DIAGNOSIS — Z888 Allergy status to other drugs, medicaments and biological substances status: Secondary | ICD-10-CM

## 2019-12-07 DIAGNOSIS — D473 Essential (hemorrhagic) thrombocythemia: Secondary | ICD-10-CM | POA: Diagnosis present

## 2019-12-07 DIAGNOSIS — E871 Hypo-osmolality and hyponatremia: Secondary | ICD-10-CM | POA: Diagnosis not present

## 2019-12-07 DIAGNOSIS — R6520 Severe sepsis without septic shock: Secondary | ICD-10-CM | POA: Diagnosis present

## 2019-12-07 DIAGNOSIS — R0602 Shortness of breath: Secondary | ICD-10-CM | POA: Diagnosis not present

## 2019-12-07 DIAGNOSIS — F4310 Post-traumatic stress disorder, unspecified: Secondary | ICD-10-CM | POA: Diagnosis present

## 2019-12-07 DIAGNOSIS — E870 Hyperosmolality and hypernatremia: Secondary | ICD-10-CM | POA: Diagnosis present

## 2019-12-07 DIAGNOSIS — Z800 Family history of malignant neoplasm of digestive organs: Secondary | ICD-10-CM

## 2019-12-07 DIAGNOSIS — Z96641 Presence of right artificial hip joint: Secondary | ICD-10-CM | POA: Diagnosis present

## 2019-12-07 LAB — I-STAT ARTERIAL BLOOD GAS, ED
Acid-base deficit: 2 mmol/L (ref 0.0–2.0)
Bicarbonate: 22.9 mmol/L (ref 20.0–28.0)
Calcium, Ion: 1.33 mmol/L (ref 1.15–1.40)
HCT: 42 % (ref 36.0–46.0)
Hemoglobin: 14.3 g/dL (ref 12.0–15.0)
O2 Saturation: 97 %
Patient temperature: 98.4
Potassium: 3.7 mmol/L (ref 3.5–5.1)
Sodium: 149 mmol/L — ABNORMAL HIGH (ref 135–145)
TCO2: 24 mmol/L (ref 22–32)
pCO2 arterial: 36.9 mmHg (ref 32.0–48.0)
pH, Arterial: 7.401 (ref 7.350–7.450)
pO2, Arterial: 90 mmHg (ref 83.0–108.0)

## 2019-12-07 LAB — CBC WITH DIFFERENTIAL/PLATELET
Abs Immature Granulocytes: 0.21 10*3/uL — ABNORMAL HIGH (ref 0.00–0.07)
Basophils Absolute: 0.2 10*3/uL — ABNORMAL HIGH (ref 0.0–0.1)
Basophils Relative: 1 %
Eosinophils Absolute: 0.3 10*3/uL (ref 0.0–0.5)
Eosinophils Relative: 2 %
HCT: 42.6 % (ref 36.0–46.0)
Hemoglobin: 13.1 g/dL (ref 12.0–15.0)
Immature Granulocytes: 1 %
Lymphocytes Relative: 8 %
Lymphs Abs: 1.7 10*3/uL (ref 0.7–4.0)
MCH: 31.8 pg (ref 26.0–34.0)
MCHC: 30.8 g/dL (ref 30.0–36.0)
MCV: 103.4 fL — ABNORMAL HIGH (ref 80.0–100.0)
Monocytes Absolute: 0.6 10*3/uL (ref 0.1–1.0)
Monocytes Relative: 3 %
Neutro Abs: 17.9 10*3/uL — ABNORMAL HIGH (ref 1.7–7.7)
Neutrophils Relative %: 85 %
Platelets: 1031 10*3/uL (ref 150–400)
RBC: 4.12 MIL/uL (ref 3.87–5.11)
RDW: 18.8 % — ABNORMAL HIGH (ref 11.5–15.5)
WBC: 20.9 10*3/uL — ABNORMAL HIGH (ref 4.0–10.5)
nRBC: 0.2 % (ref 0.0–0.2)

## 2019-12-07 LAB — COMPREHENSIVE METABOLIC PANEL
ALT: 9 U/L (ref 0–44)
AST: 13 U/L — ABNORMAL LOW (ref 15–41)
Albumin: 2.7 g/dL — ABNORMAL LOW (ref 3.5–5.0)
Alkaline Phosphatase: 75 U/L (ref 38–126)
Anion gap: 11 (ref 5–15)
BUN: 16 mg/dL (ref 8–23)
CO2: 23 mmol/L (ref 22–32)
Calcium: 9.2 mg/dL (ref 8.9–10.3)
Chloride: 114 mmol/L — ABNORMAL HIGH (ref 98–111)
Creatinine, Ser: 0.31 mg/dL — ABNORMAL LOW (ref 0.44–1.00)
GFR, Estimated: >60 mL/min (ref >60)
Glucose, Bld: 87 mg/dL (ref 70–99)
Potassium: 3.8 mmol/L (ref 3.5–5.1)
Sodium: 148 mmol/L — ABNORMAL HIGH (ref 135–145)
Total Bilirubin: 1 mg/dL (ref 0.3–1.2)
Total Protein: 7.4 g/dL (ref 6.5–8.1)

## 2019-12-07 LAB — LACTIC ACID, PLASMA: Lactic Acid, Venous: 1.2 mmol/L (ref 0.5–1.9)

## 2019-12-07 LAB — BRAIN NATRIURETIC PEPTIDE: B Natriuretic Peptide: 1705.4 pg/mL — ABNORMAL HIGH (ref 0.0–100.0)

## 2019-12-07 LAB — PROTIME-INR
INR: 3.4 — ABNORMAL HIGH (ref 0.8–1.2)
Prothrombin Time: 33 seconds — ABNORMAL HIGH (ref 11.4–15.2)

## 2019-12-07 LAB — SARS CORONAVIRUS 2 BY RT PCR (HOSPITAL ORDER, PERFORMED IN ~~LOC~~ HOSPITAL LAB): SARS Coronavirus 2: NEGATIVE

## 2019-12-07 MED ORDER — SODIUM CHLORIDE 0.9 % IV SOLN
500.0000 mg | Freq: Once | INTRAVENOUS | Status: AC
  Administered 2019-12-07: 500 mg via INTRAVENOUS
  Filled 2019-12-07: qty 500

## 2019-12-07 MED ORDER — SODIUM CHLORIDE 0.9 % IV BOLUS: 500.0000 mL | Freq: Once | INTRAVENOUS | Status: DC

## 2019-12-07 MED ORDER — SODIUM CHLORIDE 0.9 % IV SOLN
1.0000 g | Freq: Once | INTRAVENOUS | Status: AC
  Administered 2019-12-07: 1 g via INTRAVENOUS
  Filled 2019-12-07: qty 10

## 2019-12-07 NOTE — Telephone Encounter (Signed)
Spoke with daughter today and info given. 

## 2019-12-07 NOTE — ED Provider Notes (Addendum)
[place] EMERGENCY DEPARTMENT Provider Note   CSN: [id number] Arrival date & time: 12/07/19  1527    History Chief Complaint  Patient presents with  . Shortness of Breath   [name] Fellows is a 86 y.o. female with a pertinent past medical history of stroke, PE/DVT, thrombocytosis presents to ED via EMS with daughter due to decreased from baseline.  Now with lethargy.  Daughter states that 1 week ago she started to look foggy and glazed over and did not want to eat.  Was taken to PCP office yesterday and worked up for a concern of infection without known cause following chest x-ray and blood work. Initial thought of source to be patient decubitus ulcers x3.  Now endorsing flaccid right-sided that was previously spastic as well as a deep rattling cough.  Became short of breath while waiting in the ED.  Denies fever, nausea, vomiting, diarrhea and does have occasional constipation.  Also denies abnormal urine odor. Was given solu-medrol and 6L O2. Now continued on home O2 of 2L.    Past Medical History:  Diagnosis Date  . ARTHRITIS   . Arthritis   . Diverticulosis   . Essential thrombocytosis ([place])   . HYPERTENSION   . HYPERTHYROIDISM   . Hyperthyroidism    s/p I-131 ablation 03/2011 of multinod goiter  . INSOMNIA   . MIGRAINE HEADACHE   . OBESITY   . Posttraumatic stress disorder   . Pulmonary embolism ([place]) 03/2014   with DVT  . Stroke [facility]) 03/2014   dysarthria   Patient Active Problem List   Diagnosis Date Noted  . Malaise 12/06/2019  . Healthcare maintenance 12/04/2019  . Chronic rhinitis 12/04/2019  . Rhonchi 05/11/2019  . Urinary incontinence without sensory awareness 04/26/2019  . GERD (gastroesophageal reflux disease) 02/12/2019  . Decubitus ulcer of back, unstageable ([place]) 10/25/2018  . Right breast lymphedema 10/18/2018  . Sleep difficulties 05/24/2018  . Right shoulder pain 11/24/2017  . Hallucinations 11/24/2017  . Adhesive capsulitis of  right shoulder 11/23/2017  . Bilateral leg edema 05/24/2017  . Cholecystitis 02/23/2017  . Chronic respiratory failure with hypoxia ([place]) 02/23/2017  . Neuralgia 01/09/2017  . Venous congestion 10/08/2016  . Lymphedema of breast 09/11/2016  . Long term (current) use of anticoagulants [Z79.01] 09/10/2016  . Cough 08/23/2016  . Left hip pain 08/09/2016  . Left leg swelling 05/18/2016  . Pressure ulcer 04/27/2016  . Aspiration pneumonia ([place]) 04/20/2016  . Hemidiaphragm paralysis   . Wheelchair bound 02/03/2016  . Discoloration of skin 09/26/2015  . Essential thrombocytosis ([place]) 07/31/2015  . SVC (superior vena cava obstruction), chronic 07/20/2015  . Dysphagia 07/20/2015  . Constipation 07/20/2015  . Thrombophlebitis of breast, right 06/18/2015  . Tracheal deviation 06/06/2015  . Sensorineural hearing loss, bilateral, moderate-moderately severe 04/30/2015  . Abdominal wall lump 03/09/2015  . Frequent UTI 02/06/2015  . Primary osteoarthritis involving multiple joints 07/26/2014  . Pernicious anemia 07/26/2014  . Encounter for therapeutic drug monitoring 07/17/2014  . Spastic hemiparesis affecting dominant side ([place]), right 05/31/2014  . DVT of lower extremity, bilateral ([place]) 03/14/2014  . Global aphasia 03/14/2014  . 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 Aphasia S/P CVA 03/13/2014  . Cerebral infarction due to embolism of left middle cerebral artery ([place])   . Stroke, embolic ([place]) 41/74/0814  . History of pulmonary embolism 03/08/2014  . Primary localized osteoarthrosis, lower leg 03/06/2013  . IBS (irritable bowel syndrome)   . Multinodular goiter 01/13/2011  . Hyperthyroidism 11/11/2009  .  Osteoarthritis 04/21/2008  . Migraine headache 04/20/2008   Past Surgical History:  Procedure Laterality Date  . ABDOMINAL HYSTERECTOMY  1976  . BREAST SURGERY     biopsy  . RADIOLOGY WITH ANESTHESIA Left 03/08/2014   Procedure: RADIOLOGY WITH ANESTHESIA;  Surgeon: Rob Hickman,  MD;  Location: [facility];  Service: Radiology;  Laterality: Left;  . TONSILLECTOMY    . TOTAL HIP ARTHROPLASTY  1998    right    OB History   No obstetric history on file.    Family History  Problem Relation Age of Onset  . Asthma Mother   . Asthma Father   . Prostate cancer Father   . Stroke Brother   . Breast cancer Sister   . Stomach cancer Sister   . Thyroid disease Neg Hx    Social History   Tobacco Use  . Smoking status: Never Smoker  . Smokeless tobacco: Never Used  Substance Use Topics  . Alcohol use: No    Alcohol/week: 0.0 standard drinks  . Drug use: No   Home Medications Prior to Admission medications   Medication Sig Start Date End Date Taking? Authorizing Provider  acetaminophen (TYLENOL) 160 MG/5ML liquid Take 480 mg by mouth every 4 (four) hours as needed for fever or pain.    [provider]  azelastine (ASTELIN) 0.1 % nasal spray Place 2 sprays into both nostrils 2 (two) times daily. Use in each nostril as directed 05/15/19   Noemi Chapel P, DO  ciclopirox (PENLAC) 8 % solution APPLY TOPICALLY AT BEDTIME OVER NAIL & SURROUNDING SKIN,APPLY DAILY OVER PREVIOUS COAT,AFTER 7 DAYS REMOVE WITH ALCOHOL AND CONTINUE CYCLE 08/23/18   Burns, Claudina Lick, MD  hydroxyurea (HYDREA) 500 MG capsule TAKE 1 TABLET BY MOUTH DAILY EXCEPT 2 TABLETS ( 1000 MG ) ON WEDNESDAY, SATURDAY AND SUNDAY 11/24/19   Alvy Bimler, Ni, MD  ipratropium-albuterol (DUONEB) 0.5-2.5 (3) MG/3ML SOLN Take 3 mLs by nebulization every 6 (six) hours as needed (shortness of breath). 09/30/18   [name], MD  lidocaine (LIDODERM) 5 % Place 1 patch daily onto the skin. Remove & Discard patch within 12 hours or as directed by MD Patient taking differently: Place 1 patch onto the skin daily as needed (pain). Remove & Discard patch within 12 hours or as directed by MD 11/09/16   Nche, Charlene Brooke, NP  NONFORMULARY OR COMPOUNDED ITEM Topical cream with equal parts diclofenac, gabapentin, lidocaine,  menthol  Disp 100 gm 07/01/17   Burns, Claudina Lick, MD  Oral Hygiene Products (Q-CARE COVERD [place]) MISC Use as directed 02/23/17   [name], MD  OXYGEN 2lpm 24/7  DME- [facility]    [provider]  polyethylene glycol (MIRALAX / GLYCOLAX) packet TAKE 17G BY MOUTH TWICE A DAY Patient taking differently: Take 17 g by mouth 2 (two) times daily as needed for mild constipation. As needed 03/12/16   [name], MD  pregabalin (LYRICA) 50 MG capsule TAKE 1 CAPSULE BY MOUTH THREE TIMES A DAY 11/13/19   [name], MD  Probiotic Product (ALIGN) 4 MG CAPS Take 4 mg by mouth daily.     [provider]  propranolol (INDERAL) 10 MG tablet TAKE 0.5 TABLETS (5 MG TOTAL) BY MOUTH 2 (TWO) TIMES DAILY. 07/06/19   Burns, Claudina Lick, MD  PROTONIX 40 MG PACK TAKE 20 MLS (40 MG TOTAL) BY MOUTH 2 (TWO) TIMES DAILY. 10/23/19   [name], MD  traMADol (ULTRAM) 50 MG tablet TAKE  2 TABLETS BY MOUTH 3 TIMES A DAY AS NEEDED 10/30/19   [name], MD  traZODone (DESYREL) 50 MG tablet TAKE 1 TABLET (50 MG TOTAL) BY MOUTH AT BEDTIME AS NEEDED. FOR SLEEP 11/06/19   Burns, Claudina Lick, MD  VOLTAREN 1 % GEL APPLY 2 G TOPICALLY 3 (THREE) TIMES DAILY AS NEEDED. 08/28/19   [name], MD  warfarin (COUMADIN) 4 MG tablet TAKE 1 TABLET DAILY EXCEPT 1/2 ON SUNDAY AND THURSDAYS OR TAKE AS DIRECTED BY ANTICOAGULATION CLINIC 07/04/19   [name], MD    Allergies    Valtrex [valacyclovir hcl]  Review of Systems   Review of Systems  Constitutional: Positive for activity change and appetite change. Negative for fever.  Respiratory: Positive for cough and shortness of breath. Negative for wheezing.   Cardiovascular: Negative for chest pain.  Gastrointestinal: Positive for constipation. Negative for diarrhea, nausea and vomiting.  Genitourinary: Negative for difficulty urinating.  Neurological: Positive for weakness. Negative for syncope and facial asymmetry.  Psychiatric/Behavioral: Positive for  confusion. Negative for agitation.    Physical Exam Updated Vital Signs BP 126/83   Pulse 96   Temp 99.7 F (37.6 C) (Oral)   Resp (!) 22   SpO2 92%   Physical Exam Constitutional:      General: She is not in acute distress. HENT:     Head: Normocephalic.  Cardiovascular:     Rate and Rhythm: Normal rate and regular rhythm.  Pulmonary:     Effort: Pulmonary effort is normal. No respiratory distress.     Breath sounds: Examination of the right-upper field reveals decreased breath sounds. Decreased breath sounds present. No wheezing.  Abdominal:     General: Bowel sounds are normal.     Palpations: Abdomen is soft.     Tenderness: There is no guarding.  Musculoskeletal:     Right lower leg: No tenderness. No edema.     Left lower leg: No tenderness. No edema.  Skin:    General: Skin is warm and dry.     Comments: Decubitus ulcers non-infectious appearing without erythema or drainage  Neurological:     Mental Status: She is alert.     Motor: Weakness present.     Comments: Right sided weakness and flaccidity    Media Information    ED Results / Procedures / Treatments   Labs (all labs ordered are listed, but only abnormal results are displayed) Labs Reviewed  CBC WITH DIFFERENTIAL/PLATELET - Abnormal; Notable for the following components:      Result Value   WBC 20.9 (*)    MCV 103.4 (*)    RDW 18.8 (*)    Platelets 1,031 (*)    Neutro Abs 17.9 (*)    Basophils Absolute 0.2 (*)    Abs Immature Granulocytes 0.21 (*)    All other components within normal limits  COMPREHENSIVE METABOLIC PANEL - Abnormal; Notable for the following components:   Sodium 148 (*)    Chloride 114 (*)    Creatinine, Ser 0.31 (*)    Albumin 2.7 (*)    AST 13 (*)    All other components within normal limits  PROTIME-INR - Abnormal; Notable for the following components:   Prothrombin Time 33.0 (*)    INR 3.4 (*)    All other components within normal limits  BRAIN NATRIURETIC PEPTIDE -  Abnormal; Notable for the following components:   B Natriuretic Peptide 1,705.4 (*)    All other components within normal  limits  SARS CORONAVIRUS 2 BY RT PCR (HOSPITAL ORDER, [facility])  CULTURE, BLOOD (ROUTINE X 2)  CULTURE, BLOOD (ROUTINE X 2)  LACTIC ACID, PLASMA  PATHOLOGIST SMEAR REVIEW  LACTIC ACID, PLASMA  URINALYSIS, ROUTINE W REFLEX MICROSCOPIC    EKG EKG Interpretation  Date/Time:  Thursday December 07 2019 15:43:16 EST Ventricular Rate:  89 PR Interval:  184 QRS Duration: 70 QT Interval:  340 QTC Calculation: 413 R Axis:   34 Text Interpretation: Normal sinus rhythm with sinus arrhythmia Possible Left atrial enlargement Cannot rule out Anterior infarct , age undetermined Abnormal ECG Confirmed by Quintella Reichert [id number]) on 12/07/2019 7:03:25 PM  Radiology DG Chest 2 View  Result Date: 12/07/2019 CLINICAL DATA:  Shortness of breath. Additional history provided: Patient reports shortness of breath for 1 day, history of hypertension and stroke. Additional history obtained from EMR: Patient with elevated white blood cell count, cough and congestion. EXAM: CHEST - 2 VIEW COMPARISON:  Prior chest radiographs 12/06/2019. CTA neck 04/11/2019. FINDINGS: Soft tissue prominence within the lower neck and upper mediastinum with marked rightward deviation of the trachea. This finding corresponds with the large goiter demonstrated on the prior CTA neck of 04/11/2019. Mild cardiomegaly, unchanged. Aortic atherosclerosis. New from yesterday's radiograph, there is apparent hazy opacity within the left upper to mid lung field. Mild opacity within the left lung base has an appearance most suggestive of atelectasis. The right lung remains clear. Possible trace left pleural effusion, unchanged. No evidence of pneumothorax. No acute bony abnormality identified. Prominent left glenohumeral joint degenerative changes. IMPRESSION: Apparent hazy opacity within the left upper to  mid lung field, yesterday's chest radiographs and suspicious for pneumonia. Possible trace left pleural effusion, unchanged. Mild cardiomegaly. Aortic Atherosclerosis (ICD10-I70.0). Large thyroid goiter with marked rightward tracheal displacement. Electronically Signed   By: Kellie Simmering DO   On: 12/07/2019 16:44   DG Chest 2 View  Result Date: 12/06/2019 CLINICAL DATA:  Cough.  Known large goiter.  Aspiration pneumonia. EXAM: CHEST - 2 VIEW COMPARISON:  05/11/2019 FINDINGS: Rightward tracheal deviation with upper mediastinal soft tissue density compatible with known goiter. Similar appearance to prior. Atherosclerotic calcification of the aortic arch. Stable vascular calcifications projecting over the left scapula. Borderline enlargement of the cardiopericardial silhouette. Mild blunting of the left posterior costophrenic angle, suggesting trace left pleural effusion. Chronic prominence of left lung pulmonary vasculature especially in the upper lobe, as shown on prior chest CT of/21/20. Thoracic spondylosis. Notable bilateral degenerative glenohumeral arthropathy. IMPRESSION: 1. Probable trace left pleural effusion. 2. Borderline enlargement of the cardiopericardial silhouette. 3. Chronic prominence of left lung pulmonary vasculature. Electronically Signed   By: Van Clines M.D.   On: 12/06/2019 12:35   CT Head Wo Contrast  Result Date: 12/07/2019 CLINICAL DATA:  Acute neuro deficit. EXAM: CT HEAD WITHOUT CONTRAST TECHNIQUE: Contiguous axial images were obtained from the base of the skull through the vertex without intravenous contrast. COMPARISON:  CT head 10/05/2017 FINDINGS: Brain: Large territory acute left MCA infarct unchanged. This involves the basal ganglia and frontal parietal lobe and a portion of the temporal lobe. Negative for acute infarct, hemorrhage, mass. Vascular: Negative for hyperdense vessel Skull: Negative Sinuses/Orbits: Mild mucosal edema maxillary sinus bilaterally. Negative  orbit Other: None IMPRESSION: No acute abnormality. Large  chronic infarct left MCA territory. Electronically Signed   By: Franchot Gallo M.D.   On: 12/07/2019 20:23   CT Chest Wo Contrast  Result Date: 12/07/2019 CLINICAL DATA:  Shortness of  breath EXAM: CT CHEST WITHOUT CONTRAST TECHNIQUE: Multidetector CT imaging of the chest was performed following the standard protocol without IV contrast. COMPARISON:  Chest x-ray from earlier in the same day as well as the previous day. FINDINGS: Cardiovascular: Somewhat limited due to lack of IV contrast. Mild coronary calcifications are seen. Aortic calcifications are noted without aneurysmal dilatation. No cardiac enlargement is noted. Chest wall collaterals are noted likely related to some central venous stenosis. This may be related to the underlying goiter. Mediastinum/Nodes: Thoracic inlet again demonstrates a significant multinodular goiter considerably larger on the left than the right with significant tracheal deviation to the right. This is similar to that seen on prior plain film examination as well as multiple previous CTs of the chest and neck. Previous treatment for thyrotoxicosis has been performed. No sizable hilar or mediastinal adenopathy is noted. The esophagus is deviated to the right and air filled but otherwise within normal limits. Lungs/Pleura: Lungs are well aerated bilaterally. The previously seen increased density in the left upper lobe is likely related to patient rotation and overlying soft tissue as no focal infiltrate is seen. The calcifications over the left upper lobe are related to changes in the musculature surrounding the left scapula. Scattered areas of scarring are noted similar to that seen on prior CT from 2020. No acute infiltrate or sizable parenchymal nodule is noted. Upper Abdomen: Visualized upper abdomen demonstrates multiple dependent gallstones and gallbladder sludge. The remainder of the upper abdomen is within normal  limits. Musculoskeletal: Degenerative changes of the thoracic spine are noted. No acute rib abnormality is noted. Mild compression deformities of L1 through L3 are seen but appear chronic in nature. IMPRESSION: Increased density in the left chest on recent chest x-ray is artifactual in nature related to overlying soft tissue changes. No acute infiltrate is seen. Calcifications seen on recent chest x-ray are related to the muscle surrounding the left scapula. Large multinodular goiter left greater than right similar to that seen on multiple prior CTs dating back to 2012. This has been significantly worked up and treated. No followup recommended (ref: J Am Coll Radiol. 2015 Feb;12(2): 143-50). Chronic scarring in the lungs bilaterally. Aortic Atherosclerosis (ICD10-I70.0). Electronically Signed   By: Inez Catalina M.D.   On: 12/07/2019 20:33   Procedures Procedures (including critical care time)  Medications Ordered in ED Medications  cefTRIAXone (ROCEPHIN) 1 g in sodium chloride 0.9 % 100 mL IVPB (1 g Intravenous New Bag/Given 12/07/19 2049)  azithromycin (ZITHROMAX) 500 mg in sodium chloride 0.9 % 250 mL IVPB (has no administration in time range)  sodium chloride 0.9 % bolus 500 mL (has no administration in time range)    ED Course  I have reviewed the triage vital signs and the nursing notes.  Pertinent labs & imaging results that were available during my care of the patient were reviewed by me and considered in my medical decision making (see chart for details).  Clinical Course as of Dec 06 2301  Thu Dec 07, 2019  1811 Review initial labs. Elevated white count w/ thrombocytosis. Hypernatremic.    [SA]  2028 CT head unremarkable for new infarct.   [SA]  2037 CT chest w/o evidence of acute infection   [SA]    Clinical Course User Index [SA] Autry-Lott, Naaman Plummer, DO   MDM Rules/Calculators/A&P                           86 yo F w/ PMHx  of CVA, PE/DVT, thrombocytosis, and multinodular  goiter continued on hydroxyurea and warfarin. Brought in by EMS due to family member concern for infection and repeat stroke. New oxygen requirement and cough with increase opacity of CXR yesterday CT chest w/o acute infectious process. CT head w/o acute infarct. COVID negative. Ulcers appear noninfectious, not likely source of infections. WBC 20. Plt 1031. Bld cx pending, PT/INR 33/3.4, LA 1.2, BNP 1705.4 (899.5 02/12/2017). Na 148 given IV bolus 500 x1 order, not given. Started on IV azithro and CTX for infection of unknown etiology. UA pending which could be a source, will need to follow up on bld cx. Patient would benefit from admission and further work up.  Final Clinical Impression(s) / ED Diagnoses Final diagnoses:  Elevated brain natriuretic peptide (BNP) level  Weakness  Hypoxia   Rx / DC Orders ED Discharge Orders    None       Autry-Lott, [place], DO 12/07/19 2308    Gerlene Fee, DO 12/07/19 2316    Quintella Reichert, MD 12/08/19 0001

## 2019-12-07 NOTE — Telephone Encounter (Signed)
I would like her to discuss with Dr Welton Flakes if possible - I really that will be the best route and the fastest route to figuring out if the ulcers are infected.  It is going to be difficult for me to get a scan approved and done in a timely manner.

## 2019-12-07 NOTE — ED Triage Notes (Addendum)
Pt to ED via [facility] from home with c/o shortness of breath.  EMS st's pt's 02 sats on their arrival was 36  Pt was placed on 02 via [place] at 6 LPM   Pt also received Solu Medrol 125mg  IV   Pt was seen at her MD's office yesterday and had a normal chest x-ray but did have elevated WBC's  Pt has congested cough at this time

## 2019-12-07 NOTE — Telephone Encounter (Signed)
Please call and speak to her daughter   Mom's x-ray did not show any acute infection.  Her kidney function and liver tests are normal.  Her white blood cell count is 22.6 and her sed rate or inflammatory rate is 73 here.  I do think that she has an infection.  The difficult thing is where is it.  Given the chronic ulcers that is the most likely source.  Ideally it would be good to confirm this before just putting her on antibiotics.    I am wondering if you should reach out to [facility] wound center and see if they can see her immediately.  They may be able to examine her and confirm the infection.  Please let us know if they are not able to see her immediately because we may need to do imaging to try to confirm that is the area of infection

## 2019-12-07 NOTE — ED Notes (Signed)
Patient transported to CT 

## 2019-12-07 NOTE — Telephone Encounter (Signed)
Called and left message for patient's daughter this morning and to return call to office with any questions.

## 2019-12-08 ENCOUNTER — Observation Stay (HOSPITAL_COMMUNITY): Payer: Medicare Other

## 2019-12-08 ENCOUNTER — Ambulatory Visit: Payer: Medicare Other | Admitting: Internal Medicine

## 2019-12-08 ENCOUNTER — Encounter (HOSPITAL_COMMUNITY): Payer: Self-pay | Admitting: Internal Medicine

## 2019-12-08 ENCOUNTER — Other Ambulatory Visit: Payer: Self-pay

## 2019-12-08 DIAGNOSIS — Z90710 Acquired absence of both cervix and uterus: Secondary | ICD-10-CM | POA: Diagnosis not present

## 2019-12-08 DIAGNOSIS — Z825 Family history of asthma and other chronic lower respiratory diseases: Secondary | ICD-10-CM | POA: Diagnosis not present

## 2019-12-08 DIAGNOSIS — Z7901 Long term (current) use of anticoagulants: Secondary | ICD-10-CM | POA: Diagnosis not present

## 2019-12-08 DIAGNOSIS — D75839 Thrombocytosis, unspecified: Secondary | ICD-10-CM | POA: Diagnosis present

## 2019-12-08 DIAGNOSIS — L89153 Pressure ulcer of sacral region, stage 3: Secondary | ICD-10-CM | POA: Diagnosis not present

## 2019-12-08 DIAGNOSIS — M150 Primary generalized (osteo)arthritis: Secondary | ICD-10-CM | POA: Diagnosis not present

## 2019-12-08 DIAGNOSIS — E870 Hyperosmolality and hypernatremia: Secondary | ICD-10-CM

## 2019-12-08 DIAGNOSIS — R531 Weakness: Secondary | ICD-10-CM

## 2019-12-08 DIAGNOSIS — D473 Essential (hemorrhagic) thrombocythemia: Secondary | ICD-10-CM | POA: Diagnosis present

## 2019-12-08 DIAGNOSIS — Z5181 Encounter for therapeutic drug level monitoring: Secondary | ICD-10-CM | POA: Diagnosis not present

## 2019-12-08 DIAGNOSIS — I972 Postmastectomy lymphedema syndrome: Secondary | ICD-10-CM | POA: Diagnosis not present

## 2019-12-08 DIAGNOSIS — S30810D Abrasion of lower back and pelvis, subsequent encounter: Secondary | ICD-10-CM | POA: Diagnosis not present

## 2019-12-08 DIAGNOSIS — E871 Hypo-osmolality and hyponatremia: Secondary | ICD-10-CM | POA: Diagnosis not present

## 2019-12-08 DIAGNOSIS — I69351 Hemiplegia and hemiparesis following cerebral infarction affecting right dominant side: Secondary | ICD-10-CM | POA: Diagnosis not present

## 2019-12-08 DIAGNOSIS — S70312D Abrasion, left thigh, subsequent encounter: Secondary | ICD-10-CM | POA: Diagnosis not present

## 2019-12-08 DIAGNOSIS — F4310 Post-traumatic stress disorder, unspecified: Secondary | ICD-10-CM | POA: Diagnosis present

## 2019-12-08 DIAGNOSIS — Z800 Family history of malignant neoplasm of digestive organs: Secondary | ICD-10-CM | POA: Diagnosis not present

## 2019-12-08 DIAGNOSIS — N390 Urinary tract infection, site not specified: Secondary | ICD-10-CM | POA: Diagnosis present

## 2019-12-08 DIAGNOSIS — R0602 Shortness of breath: Secondary | ICD-10-CM | POA: Diagnosis not present

## 2019-12-08 DIAGNOSIS — R9082 White matter disease, unspecified: Secondary | ICD-10-CM | POA: Diagnosis not present

## 2019-12-08 DIAGNOSIS — M50223 Other cervical disc displacement at C6-C7 level: Secondary | ICD-10-CM | POA: Diagnosis not present

## 2019-12-08 DIAGNOSIS — L89323 Pressure ulcer of left buttock, stage 3: Secondary | ICD-10-CM | POA: Diagnosis not present

## 2019-12-08 DIAGNOSIS — Z823 Family history of stroke: Secondary | ICD-10-CM | POA: Diagnosis not present

## 2019-12-08 DIAGNOSIS — I69320 Aphasia following cerebral infarction: Secondary | ICD-10-CM | POA: Diagnosis not present

## 2019-12-08 DIAGNOSIS — I6389 Other cerebral infarction: Secondary | ICD-10-CM | POA: Diagnosis not present

## 2019-12-08 DIAGNOSIS — L89154 Pressure ulcer of sacral region, stage 4: Secondary | ICD-10-CM | POA: Diagnosis present

## 2019-12-08 DIAGNOSIS — Z20822 Contact with and (suspected) exposure to covid-19: Secondary | ICD-10-CM | POA: Diagnosis present

## 2019-12-08 DIAGNOSIS — E0590 Thyrotoxicosis, unspecified without thyrotoxic crisis or storm: Secondary | ICD-10-CM | POA: Diagnosis present

## 2019-12-08 DIAGNOSIS — I63412 Cerebral infarction due to embolism of left middle cerebral artery: Secondary | ICD-10-CM | POA: Diagnosis present

## 2019-12-08 DIAGNOSIS — I10 Essential (primary) hypertension: Secondary | ICD-10-CM | POA: Diagnosis present

## 2019-12-08 DIAGNOSIS — Z8042 Family history of malignant neoplasm of prostate: Secondary | ICD-10-CM | POA: Diagnosis not present

## 2019-12-08 DIAGNOSIS — Z79899 Other long term (current) drug therapy: Secondary | ICD-10-CM | POA: Diagnosis not present

## 2019-12-08 DIAGNOSIS — Z7401 Bed confinement status: Secondary | ICD-10-CM | POA: Diagnosis not present

## 2019-12-08 DIAGNOSIS — E860 Dehydration: Secondary | ICD-10-CM | POA: Diagnosis present

## 2019-12-08 DIAGNOSIS — Z86711 Personal history of pulmonary embolism: Secondary | ICD-10-CM | POA: Diagnosis not present

## 2019-12-08 DIAGNOSIS — Z888 Allergy status to other drugs, medicaments and biological substances status: Secondary | ICD-10-CM | POA: Diagnosis not present

## 2019-12-08 DIAGNOSIS — L89303 Pressure ulcer of unspecified buttock, stage 3: Secondary | ICD-10-CM | POA: Diagnosis present

## 2019-12-08 DIAGNOSIS — K589 Irritable bowel syndrome without diarrhea: Secondary | ICD-10-CM | POA: Diagnosis not present

## 2019-12-08 DIAGNOSIS — Z803 Family history of malignant neoplasm of breast: Secondary | ICD-10-CM | POA: Diagnosis not present

## 2019-12-08 DIAGNOSIS — A419 Sepsis, unspecified organism: Secondary | ICD-10-CM | POA: Diagnosis present

## 2019-12-08 LAB — URINALYSIS, ROUTINE W REFLEX MICROSCOPIC
Bilirubin Urine: NEGATIVE
Glucose, UA: NEGATIVE mg/dL
Ketones, ur: NEGATIVE mg/dL
Nitrite: POSITIVE — AB
Protein, ur: NEGATIVE mg/dL
Specific Gravity, Urine: >1.03 — ABNORMAL HIGH (ref 1.005–1.030)
pH: 5 (ref 5.0–8.0)

## 2019-12-08 LAB — CBC WITH DIFFERENTIAL/PLATELET
Abs Immature Granulocytes: 0.42 10*3/uL — ABNORMAL HIGH (ref 0.00–0.07)
Basophils Absolute: 0.1 10*3/uL (ref 0.0–0.1)
Basophils Relative: 0 %
Eosinophils Absolute: 0 10*3/uL (ref 0.0–0.5)
Eosinophils Relative: 0 %
HCT: 41.2 % (ref 36.0–46.0)
Hemoglobin: 12.8 g/dL (ref 12.0–15.0)
Immature Granulocytes: 2 %
Lymphocytes Relative: 3 %
Lymphs Abs: 0.9 10*3/uL (ref 0.7–4.0)
MCH: 31.5 pg (ref 26.0–34.0)
MCHC: 31.1 g/dL (ref 30.0–36.0)
MCV: 101.5 fL — ABNORMAL HIGH (ref 80.0–100.0)
Monocytes Absolute: 0.2 10*3/uL (ref 0.1–1.0)
Monocytes Relative: 1 %
Neutro Abs: 26.6 10*3/uL — ABNORMAL HIGH (ref 1.7–7.7)
Neutrophils Relative %: 94 %
Platelets: 1026 10*3/uL (ref 150–400)
RBC: 4.06 MIL/uL (ref 3.87–5.11)
RDW: 19.2 % — ABNORMAL HIGH (ref 11.5–15.5)
WBC: 28.3 10*3/uL — ABNORMAL HIGH (ref 4.0–10.5)
nRBC: 0.1 % (ref 0.0–0.2)

## 2019-12-08 LAB — COMPREHENSIVE METABOLIC PANEL
ALT: 8 U/L (ref 0–44)
AST: 15 U/L (ref 15–41)
Albumin: 2.5 g/dL — ABNORMAL LOW (ref 3.5–5.0)
Alkaline Phosphatase: 75 U/L (ref 38–126)
Anion gap: 14 (ref 5–15)
BUN: 16 mg/dL (ref 8–23)
CO2: 21 mmol/L — ABNORMAL LOW (ref 22–32)
Calcium: 9 mg/dL (ref 8.9–10.3)
Chloride: 112 mmol/L — ABNORMAL HIGH (ref 98–111)
Creatinine, Ser: <0.3 mg/dL — ABNORMAL LOW (ref 0.44–1.00)
Glucose, Bld: 119 mg/dL — ABNORMAL HIGH (ref 70–99)
Potassium: 3.5 mmol/L (ref 3.5–5.1)
Sodium: 147 mmol/L — ABNORMAL HIGH (ref 135–145)
Total Bilirubin: 0.9 mg/dL (ref 0.3–1.2)
Total Protein: 7.1 g/dL (ref 6.5–8.1)

## 2019-12-08 LAB — URINALYSIS, MICROSCOPIC (REFLEX)

## 2019-12-08 LAB — TROPONIN I (HIGH SENSITIVITY)
Troponin I (High Sensitivity): 8 ng/L (ref <18)
Troponin I (High Sensitivity): 5 ng/L (ref <18)

## 2019-12-08 LAB — PROTIME-INR
INR: 3.9 — ABNORMAL HIGH (ref 0.8–1.2)
Prothrombin Time: 36.7 seconds — ABNORMAL HIGH (ref 11.4–15.2)

## 2019-12-08 MED ORDER — AZELASTINE HCL 0.1 % NA SOLN
2.0000 | Freq: Two times a day (BID) | NASAL | Status: DC
  Administered 2019-12-08 – 2019-12-13 (×11): 2 via NASAL
  Filled 2019-12-08: qty 30

## 2019-12-08 MED ORDER — SODIUM CHLORIDE 0.9 % IV SOLN
2.0000 g | INTRAVENOUS | Status: AC
  Administered 2019-12-08 – 2019-12-11 (×4): 2 g via INTRAVENOUS
  Filled 2019-12-08 (×2): qty 2
  Filled 2019-12-08 (×2): qty 20

## 2019-12-08 MED ORDER — TRAMADOL HCL 50 MG PO TABS
50.0000 mg | ORAL_TABLET | Freq: Four times a day (QID) | ORAL | Status: DC | PRN
  Administered 2019-12-08 – 2019-12-13 (×17): 50 mg via ORAL
  Filled 2019-12-08 (×18): qty 1

## 2019-12-08 MED ORDER — SODIUM CHLORIDE 0.9 % IV SOLN
500.0000 mg | INTRAVENOUS | Status: DC
  Administered 2019-12-08: 500 mg via INTRAVENOUS
  Filled 2019-12-08 (×2): qty 500

## 2019-12-08 MED ORDER — OMEGA-3-ACID ETHYL ESTERS 1 G PO CAPS
1.0000 | ORAL_CAPSULE | Freq: Every day | ORAL | Status: DC
  Administered 2019-12-08 – 2019-12-13 (×6): 1 g via ORAL
  Filled 2019-12-08 (×6): qty 1

## 2019-12-08 MED ORDER — ALIGN 4 MG PO CAPS: 4.0000 mg | ORAL_CAPSULE | Freq: Every day | ORAL | Status: DC

## 2019-12-08 MED ORDER — LORAZEPAM 2 MG/ML IJ SOLN
0.2500 mg | Freq: Once | INTRAMUSCULAR | Status: AC
  Administered 2019-12-08: 0.25 mg via INTRAVENOUS
  Filled 2019-12-08: qty 1

## 2019-12-08 MED ORDER — RISAQUAD PO CAPS
2.0000 | ORAL_CAPSULE | Freq: Every day | ORAL | Status: DC
  Administered 2019-12-08 – 2019-12-13 (×6): 2 via ORAL
  Filled 2019-12-08 (×6): qty 2

## 2019-12-08 MED ORDER — LIDOCAINE 5 % EX PTCH: 1.0000 | MEDICATED_PATCH | Freq: Every day | CUTANEOUS | Status: DC | PRN

## 2019-12-08 MED ORDER — ACETAMINOPHEN 650 MG RE SUPP: 650.0000 mg | Freq: Four times a day (QID) | RECTAL | Status: DC | PRN

## 2019-12-08 MED ORDER — PANTOPRAZOLE SODIUM 40 MG PO TBEC
40.0000 mg | DELAYED_RELEASE_TABLET | Freq: Two times a day (BID) | ORAL | Status: DC
  Administered 2019-12-08 – 2019-12-13 (×11): 40 mg via ORAL
  Filled 2019-12-08 (×11): qty 1

## 2019-12-08 MED ORDER — HYDROXYUREA 500 MG PO CAPS
500.0000 mg | ORAL_CAPSULE | ORAL | Status: DC
  Administered 2019-12-08 – 2019-12-12 (×3): 500 mg via ORAL
  Filled 2019-12-08 (×5): qty 1

## 2019-12-08 MED ORDER — HYDROXYUREA 500 MG PO CAPS: 500.0000 mg | ORAL_CAPSULE | ORAL | Status: DC

## 2019-12-08 MED ORDER — PROPRANOLOL HCL 10 MG PO TABS
5.0000 mg | ORAL_TABLET | Freq: Two times a day (BID) | ORAL | Status: DC
  Administered 2019-12-08 – 2019-12-13 (×11): 5 mg via ORAL
  Filled 2019-12-08 (×12): qty 0.5

## 2019-12-08 MED ORDER — HYDROXYUREA 500 MG PO CAPS
1000.0000 mg | ORAL_CAPSULE | ORAL | Status: DC
  Administered 2019-12-09 – 2019-12-13 (×3): 1000 mg via ORAL
  Filled 2019-12-08 (×3): qty 2

## 2019-12-08 MED ORDER — IPRATROPIUM-ALBUTEROL 0.5-2.5 (3) MG/3ML IN SOLN
3.0000 mL | Freq: Four times a day (QID) | RESPIRATORY_TRACT | Status: DC | PRN
  Administered 2019-12-09 – 2019-12-10 (×2): 3 mL via RESPIRATORY_TRACT
  Filled 2019-12-08 (×2): qty 3

## 2019-12-08 MED ORDER — DEXTROSE 5 % IV SOLN: INTRAVENOUS | Status: AC

## 2019-12-08 MED ORDER — PREGABALIN 25 MG PO CAPS
50.0000 mg | ORAL_CAPSULE | Freq: Three times a day (TID) | ORAL | Status: DC
  Administered 2019-12-08 – 2019-12-13 (×17): 50 mg via ORAL
  Filled 2019-12-08 (×17): qty 2

## 2019-12-08 MED ORDER — DICLOFENAC SODIUM 1 % EX GEL
2.0000 g | Freq: Three times a day (TID) | CUTANEOUS | Status: DC | PRN
  Administered 2019-12-08 – 2019-12-10 (×3): 2 g via TOPICAL
  Filled 2019-12-08 (×2): qty 100

## 2019-12-08 MED ORDER — ACETAMINOPHEN 325 MG PO TABS
650.0000 mg | ORAL_TABLET | Freq: Four times a day (QID) | ORAL | Status: DC | PRN
  Administered 2019-12-11 – 2019-12-13 (×2): 650 mg via ORAL
  Filled 2019-12-08 (×2): qty 2

## 2019-12-08 MED ORDER — TRAZODONE HCL 50 MG PO TABS
50.0000 mg | ORAL_TABLET | Freq: Every evening | ORAL | Status: DC | PRN
  Administered 2019-12-08 – 2019-12-12 (×5): 50 mg via ORAL
  Filled 2019-12-08 (×5): qty 1

## 2019-12-08 NOTE — Evaluation (Signed)
Clinical/Bedside Swallow Evaluation Patient Details  Name: Sandy Barrett MRN: [id number] Date of Birth: 04/29/1932  Today's Date: 12/08/2019 Time: SLP Start Time (ACUTE ONLY): 1100 SLP Stop Time (ACUTE ONLY): 1138 SLP Time Calculation (min) (ACUTE ONLY): 38 min  Past Medical History:  Past Medical History:  Diagnosis Date  . ARTHRITIS   . Arthritis   . Diverticulosis   . Essential thrombocytosis ([place])   . HYPERTENSION   . HYPERTHYROIDISM   . Hyperthyroidism    s/p I-131 ablation 03/2011 of multinod goiter  . INSOMNIA   . MIGRAINE HEADACHE   . OBESITY   . Posttraumatic stress disorder   . Pulmonary embolism ([place]) 03/2014   with DVT  . Stroke [facility]) 03/2014   dysarthria   Past Surgical History:  Past Surgical History:  Procedure Laterality Date  . ABDOMINAL HYSTERECTOMY  1976  . BREAST SURGERY     biopsy  . RADIOLOGY WITH ANESTHESIA Left 03/08/2014   Procedure: RADIOLOGY WITH ANESTHESIA;  Surgeon: Rob Hickman, MD;  Location: [facility];  Service: Radiology;  Laterality: Left;  . TONSILLECTOMY    . TOTAL HIP ARTHROPLASTY  1998    right   HPI:  Sandy Barrett is a 86 y.o. female with history of stroke with right-sided hemiplegia, history of pulmonary embolism on Coumadin, essential thrombocytosis on hydroxyurea was brought to the ER after patient's family found that patient was having weakness on the right upper and lower extremity.  Patient has previous stroke and is hemiplegic on the right side but as per the family the right side is usually spastic but became more flaccid.  In addition patient also was found to be having some cough. CT head and CT chest was unremarkable.  MRI brain and MRA neck was done which were unremarkable.  Patient's UA was concerning for UTI was started on empiric antibiotics. Pt had an OP MBS on 05/26/19 that showed premature spillage, penetration with thin, particularly with consecutive sips, mild residue. Recommended to consume dys2/3 solids with thin  liquids by cup, follow solids with liquids.    Assessment / Plan / Recommendation Clinical Impression  Pt demonstrates mild chronic oral dysaphgai from prior CVA including right anterior spillage particularly when positioned to the right. She is otherwise without signs of aspriatino. Her daughters at bedsdie report that she has had some increased pocketing on the right over the past 2-3 days. We discussed who acute illness such as UTI or infection can cause stroke survivors to have some lid recurrence of weakness or dysphagia. We also discussed pts prior swallow evaluations and recommendations for slow rate, following solids with liquids, but generally low risk of aspiration given mild nature of residual dyspahgia. Furthermore family reproted that at times pt has problems with her goiter impacting her swallow. In CT imaging thics displaces her trachea. It may also displace the esophagus. We discussed appropraite precautions to faciliate airway protection. Will sign off at this time. Pt can continue current diet, regular/thin.       Aspiration Risk       Diet Recommendation Regular;Thin liquid   Liquid Administration via: Straw Medication Administration: Crushed with puree Supervision: Full supervision/cueing for compensatory strategies (family typically provided set up support)    Other  Recommendations     Follow up Recommendations        Frequency and Duration            Prognosis        Swallow Study   General HPI: Sandy Barrett  is a 86 y.o. female with history of stroke with right-sided hemiplegia, history of pulmonary embolism on Coumadin, essential thrombocytosis on hydroxyurea was brought to the ER after patient's family found that patient was having weakness on the right upper and lower extremity.  Patient has previous stroke and is hemiplegic on the right side but as per the family the right side is usually spastic but became more flaccid.  In addition patient also was  found to be having some cough. CT head and CT chest was unremarkable.  MRI brain and MRA neck was done which were unremarkable.  Patient's UA was concerning for UTI was started on empiric antibiotics. Pt had an OP MBS on 05/26/19 that showed premature spillage, penetration with thin, particularly with consecutive sips, mild residue. Recommended to consume dys2/3 solids with thin liquids by cup, follow solids with liquids.  Type of Study: Bedside Swallow Evaluation Diet Prior to this Study: Regular;Thin liquids Temperature Spikes Noted: No Respiratory Status: Room air History of Recent Intubation: No Behavior/Cognition: Alert;Cooperative;Pleasant mood Oral Cavity Assessment: Within Functional Limits Oral Care Completed by SLP: No Oral Cavity - Dentition: Dentures, top Vision: Functional for self-feeding Self-Feeding Abilities: Needs assist Patient Positioning: Postural control interferes with function Baseline Vocal Quality: Normal Volitional Cough: Strong Volitional Swallow: Able to elicit    Oral/Motor/Sensory Function     Ice Chips Ice chips: Within functional limits   Thin Liquid Thin Liquid: Impaired Presentation: Straw Oral Phase Impairments: Reduced labial seal Oral Phase Functional Implications: Right anterior spillage    Nectar Thick Nectar Thick Liquid: Not tested   Honey Thick Honey Thick Liquid: Not tested   Puree Puree: Within functional limits   Solid     Solid: Within functional limits     Herbie [place], MA [facility] Pager [phone] Office [phone]  Lynann Beaver 12/08/2019,11:42 AM

## 2019-12-08 NOTE — H&P (Signed)
History and Physical    Sandy Barrett JME:[id number] DOB: 06-16-1932 DOA: 12/07/2019  PCP: [name], MD  Patient coming from: Home.  History obtained from patient's daughter.  Patient has aphasia from previous stroke.  Chief Complaint: Right-sided weakness.  HPI: Sandy Barrett is a 86 y.o. female with history of stroke with right-sided hemiplegia, history of pulmonary embolism on Coumadin, essential thrombocytosis on hydroxyurea was brought to the ER after patient's family found that patient was having weakness on the right upper and lower extremity.  Patient has previous stroke and is hemiplegic on the right side but as per the family the right side is usually spastic but became more flaccid.  In addition patient also was found to be having some cough.  Given the symptoms patient was brought to the ER.  ED Course: In the ER patient was afebrile labs are significant for sodium of 148 WBC of 20.9 platelets of thousand.  BNP was 1700 lactic acid was normal.  CT head and CT chest was unremarkable.  MRI brain and MRA neck was done which were unremarkable.  Patient's UA was concerning for UTI was started on empiric antibiotics.  Patient also was empirically start on antibiotics for possible aspiration.  Patient started on gentle hydration admitted for further management of weakness cough.  Covid test was negative.  EKG shows normal sinus rhythm low voltage.  Nonspecific ST changes.  Review of Systems: As per HPI, rest all negative.   Past Medical History:  Diagnosis Date   ARTHRITIS    Arthritis    Diverticulosis    Essential thrombocytosis ([place])    HYPERTENSION    HYPERTHYROIDISM    Hyperthyroidism    s/p I-131 ablation 03/2011 of multinod goiter   INSOMNIA    MIGRAINE HEADACHE    OBESITY    Posttraumatic stress disorder    Pulmonary embolism ([place]) 03/2014   with DVT   Stroke ([place]) 03/2014   dysarthria    Past Surgical History:  Procedure Laterality Date    ABDOMINAL HYSTERECTOMY  1976   BREAST SURGERY     biopsy   RADIOLOGY WITH ANESTHESIA Left 03/08/2014   Procedure: RADIOLOGY WITH ANESTHESIA;  Surgeon: Rob Hickman, MD;  Location: [facility];  Service: Radiology;  Laterality: Left;   [place]    right     reports that she has never smoked. She has never used smokeless tobacco. She reports that she does not drink alcohol and does not use drugs.  Allergies  Allergen Reactions   Valtrex [Valacyclovir Hcl] Cough    Cough and gagging    Family History  Problem Relation Age of Onset   Asthma Mother    Asthma Father    Prostate cancer Father    Stroke Brother    Breast cancer Sister    Stomach cancer Sister    Thyroid disease Neg Hx     Prior to Admission medications   Medication Sig Start Date End Date Taking? Authorizing Provider  acetaminophen (TYLENOL) 160 MG/5ML liquid Take 480 mg by mouth every 4 (four) hours as needed for fever or pain.   Yes [provider]  azelastine (ASTELIN) 0.1 % nasal spray Place 2 sprays into both nostrils 2 (two) times daily. Use in each nostril as directed 05/15/19  Yes Carlis Abbott, Mickel Baas P, DO  fexofenadine-pseudoephedrine (ALLEGRA-D) 60-120 MG 12 hr tablet Take 1 tablet by mouth 2 (two) times daily.   Yes [provider]  guaifenesin (CHEST CONGESTION RELIEF) 400 MG TABS tablet Take 400 mg by mouth 3 (three) times daily.   Yes [provider]  hydroxyurea (HYDREA) 500 MG capsule TAKE 1 TABLET BY MOUTH DAILY EXCEPT 2 TABLETS ( 1000 MG ) ON WEDNESDAY, [place] SUNDAY Patient taking differently: Take 500 mg by mouth See admin instructions. TAKE 1 TABLET BY MOUTH DAILY EXCEPT 2 TABLETS ( 1000 MG ) ON WEDNESDAY, SATURDAY AND SUNDAY 11/24/19  Yes Gorsuch, Ni, MD  ipratropium-albuterol (DUONEB) 0.5-2.5 (3) MG/3ML SOLN Take 3 mLs by nebulization every 6 (six) hours as needed (shortness of breath). 09/30/18  Yes Burns, Claudina Lick, MD   lidocaine (LIDODERM) 5 % Place 1 patch daily onto the skin. Remove & Discard patch within 12 hours or as directed by MD Patient taking differently: Place 1 patch onto the skin daily as needed (pain). Remove & Discard patch within 12 hours or as directed by MD 11/09/16  Yes Nche, Charlene Brooke, NP  Multiple Vitamins-Minerals (DECUBI-VITE PO) Take 1 tablet by mouth daily.   Yes [provider]  NONFORMULARY OR COMPOUNDED ITEM Topical cream with equal parts diclofenac, gabapentin, lidocaine, menthol  Disp 100 gm 07/01/17  Yes Burns, Claudina Lick, MD  Omega 3 1200 MG CAPS Take 1 capsule by mouth daily.   Yes [provider]  Oral Hygiene Products (Q-CARE COVERD YANKAUER/SUCTION) MISC Use as directed 02/23/17  Yes Burns, Claudina Lick, MD  OXYGEN 2lpm 24/7  DME- [facility]   Yes [provider]  polyethylene glycol (MIRALAX / GLYCOLAX) packet TAKE 17G BY MOUTH TWICE A DAY Patient taking differently: Take 17 g by mouth 2 (two) times daily as needed for mild constipation. As needed 03/12/16  Yes Burns, Claudina Lick, MD  pregabalin (LYRICA) 50 MG capsule TAKE 1 CAPSULE BY MOUTH THREE TIMES A DAY Patient taking differently: Take 50 mg by mouth 3 (three) times daily.  11/13/19  Yes Burns, Claudina Lick, MD  Probiotic Product (ALIGN) 4 MG CAPS Take 4 mg by mouth daily.    Yes [provider]  propranolol (INDERAL) 10 MG tablet TAKE 0.5 TABLETS (5 MG TOTAL) BY MOUTH 2 (TWO) TIMES DAILY. 07/06/19  Yes Burns, Claudina Lick, MD  PROTONIX 40 MG PACK TAKE 20 MLS (40 MG TOTAL) BY MOUTH 2 (TWO) TIMES DAILY. Patient taking differently: Take 20 mLs by mouth 2 (two) times daily.  10/23/19  Yes Burns, Claudina Lick, MD  traMADol (ULTRAM) 50 MG tablet TAKE 2 TABLETS BY MOUTH 3 TIMES A DAY AS NEEDED Patient taking differently: Take 100 mg by mouth 3 (three) times daily as needed for moderate pain.  10/30/19  Yes Burns, Claudina Lick, MD  traZODone (DESYREL) 50 MG tablet TAKE 1 TABLET (50 MG TOTAL) BY MOUTH AT BEDTIME AS NEEDED. FOR SLEEP  11/06/19  Yes Burns, Claudina Lick, MD  VOLTAREN 1 % GEL APPLY 2 G TOPICALLY 3 (THREE) TIMES DAILY AS NEEDED. Patient taking differently: Apply 2 g topically 3 (three) times daily as needed (pain).  08/28/19  Yes Burns, Claudina Lick, MD  warfarin (COUMADIN) 4 MG tablet TAKE 1 TABLET DAILY EXCEPT 1/2 ON SUNDAY AND THURSDAYS OR TAKE AS DIRECTED BY ANTICOAGULATION CLINIC 07/04/19  Yes Burns, Claudina Lick, MD  ciclopirox (PENLAC) 8 % solution APPLY TOPICALLY AT BEDTIME OVER NAIL & SURROUNDING SKIN,APPLY DAILY OVER PREVIOUS COAT,AFTER 7 DAYS REMOVE WITH ALCOHOL AND CONTINUE CYCLE Patient not taking: Reported on 12/07/2019 08/23/18   [name], MD    Physical Exam: Constitutional:  Moderately built and nourished. Vitals:   12/07/19 2230 12/07/19 2300 12/08/19 0053 12/08/19 0249  BP: 111/66 117/73 101/69 111/67  Pulse: 93 95 91 82  Resp: 19 (!) 21 16 16   Temp:   (!) 97.4 F (36.3 C) 97.6 F (36.4 C)  TempSrc:   Oral Oral  SpO2: 100% 99% 98% 95%   Eyes: Anicteric no pallor. ENMT: No discharge from the ears eyes nose or mouth. Neck: No mass felt.  No neck rigidity. Respiratory: No rhonchi or crepitations. Cardiovascular: S1-S2 heard. Abdomen: Soft nontender bowel sounds present. Musculoskeletal: No edema. Skin: No rash.  Stage IV sacral decubitus ulcer. Neurologic: Patient is alert awake but difficult to communicate has right-sided weakness from previous stroke.  Pupils are reacting to light. Psychiatric: Appears normal.  Normal affect.   Labs on Admission: I have personally reviewed following labs and imaging studies  CBC: Recent Labs  Lab 12/06/19 1210 12/07/19 1608 12/07/19 2324  WBC 22.6 Repeated and verified X2.* 20.9*  --   NEUTROABS 20.3* 17.9*  --   HGB 13.5 13.1 14.3  HCT 42.9 42.6 42.0  MCV 101.2* 103.4*  --   PLT 868.0* 1,031*  --    Basic Metabolic Panel: Recent Labs  Lab 12/06/19 1210 12/07/19 1608 12/07/19 2324  NA 147* 148* 149*  K 3.5 3.8 3.7  CL 114* 114*  --   CO2 25  23  --   GLUCOSE 91 87  --   BUN 22 16  --   CREATININE 0.42 0.31*  --   CALCIUM 9.5 9.2  --    GFR: Estimated Creatinine Clearance: 44.6 mL/min (A) (by C-G formula based on SCr of 0.31 mg/dL (L)). Liver Function Tests: Recent Labs  Lab 12/06/19 1210 12/07/19 1608  AST 20 13*  ALT 12 9  ALKPHOS 83 75  BILITOT 0.5 1.0  PROT 7.8 7.4  ALBUMIN 3.3* 2.7*   No results for input(s): LIPASE, AMYLASE in the last 168 hours. No results for input(s): AMMONIA in the last 168 hours. Coagulation Profile: Recent Labs  Lab 12/07/19 2026  INR 3.4*   Cardiac Enzymes: No results for input(s): CKTOTAL, CKMB, CKMBINDEX, TROPONINI in the last 168 hours. BNP (last 3 results) No results for input(s): PROBNP in the last 8760 hours. HbA1C: No results for input(s): HGBA1C in the last 72 hours. CBG: No results for input(s): GLUCAP in the last 168 hours. Lipid Profile: No results for input(s): CHOL, HDL, LDLCALC, TRIG, CHOLHDL, LDLDIRECT in the last 72 hours. Thyroid Function Tests: No results for input(s): TSH, T4TOTAL, FREET4, T3FREE, THYROIDAB in the last 72 hours. Anemia Panel: No results for input(s): VITAMINB12, FOLATE, FERRITIN, TIBC, IRON, RETICCTPCT in the last 72 hours. Urine analysis:    Component Value Date/Time   COLORURINE YELLOW 12/08/2019 0106   APPEARANCEUR CLOUDY (A) 12/08/2019 0106   LABSPEC >1.030 (H) 12/08/2019 0106   PHURINE 5.0 12/08/2019 0106   GLUCOSEU NEGATIVE 12/08/2019 0106   GLUCOSEU NEGATIVE 08/22/2014 1700   HGBUR LARGE (A) 12/08/2019 0106   BILIRUBINUR NEGATIVE 12/08/2019 0106   KETONESUR NEGATIVE 12/08/2019 0106   PROTEINUR NEGATIVE 12/08/2019 0106   UROBILINOGEN 0.2 08/22/2014 1700   NITRITE POSITIVE (A) 12/08/2019 0106   LEUKOCYTESUR MODERATE (A) 12/08/2019 0106   Sepsis Labs: @LABRCNTIP (procalcitonin:4,lacticidven:4) ) Recent Results (from the past 240 hour(s))  SARS Coronavirus 2 by RT PCR (hospital order, performed in [facility] hospital lab)  Nasopharyngeal Nasopharyngeal Swab     Status: None   Collection Time: 12/07/19  9:00  PM   Specimen: Nasopharyngeal Swab  Result Value Ref Range Status   SARS Coronavirus 2 NEGATIVE NEGATIVE Final    Comment: (NOTE) SARS-CoV-2 target nucleic acids are NOT DETECTED.  The SARS-CoV-2 RNA is generally detectable in upper and lower respiratory specimens during the acute phase of infection. The lowest concentration of SARS-CoV-2 viral copies this assay can detect is 250 copies / mL. A negative result does not preclude SARS-CoV-2 infection and should not be used as the sole basis for treatment or other patient management decisions.  A negative result may occur with improper specimen collection / handling, submission of specimen other than nasopharyngeal swab, presence of viral mutation(s) within the areas targeted by this assay, and inadequate number of viral copies (<250 copies / mL). A negative result must be combined with clinical observations, patient history, and epidemiological information.  Fact Sheet for Patients:   StrictlyIdeas.no  Fact Sheet for Healthcare Providers: BankingDealers.co.za  This test is not yet approved or  cleared by the [place] FDA and has been authorized for detection and/or diagnosis of SARS-CoV-2 by FDA under an Emergency Use Authorization (EUA).  This EUA will remain in effect (meaning this test can be used) for the duration of the COVID-19 declaration under Section 564(b)(1) of the Act, 21 U.S.C. section 360bbb-3(b)(1), unless the authorization is terminated or revoked sooner.  Performed at [facility], [place] [address] [place], [place] [address]      Radiological Exams on Admission: DG Chest 2 View  Result Date: 12/07/2019 CLINICAL DATA:  Shortness of breath. Additional history provided: Patient reports shortness of breath for 1 day, history of hypertension and stroke. Additional history  obtained from EMR: Patient with elevated white blood cell count, cough and congestion. EXAM: CHEST - 2 VIEW COMPARISON:  Prior chest radiographs 12/06/2019. CTA neck 04/11/2019. FINDINGS: Soft tissue prominence within the lower neck and upper mediastinum with marked rightward deviation of the trachea. This finding corresponds with the large goiter demonstrated on the prior CTA neck of 04/11/2019. Mild cardiomegaly, unchanged. Aortic atherosclerosis. New from yesterday's radiograph, there is apparent hazy opacity within the left upper to mid lung field. Mild opacity within the left lung base has an appearance most suggestive of atelectasis. The right lung remains clear. Possible trace left pleural effusion, unchanged. No evidence of pneumothorax. No acute bony abnormality identified. Prominent left glenohumeral joint degenerative changes. IMPRESSION: Apparent hazy opacity within the left upper to mid lung field, yesterday's chest radiographs and suspicious for pneumonia. Possible trace left pleural effusion, unchanged. Mild cardiomegaly. Aortic Atherosclerosis (ICD10-I70.0). Large thyroid goiter with marked rightward tracheal displacement. Electronically Signed   By: Kellie Simmering DO   On: 12/07/2019 16:44   DG Chest 2 View  Result Date: 12/06/2019 CLINICAL DATA:  Cough.  Known large goiter.  Aspiration pneumonia. EXAM: CHEST - 2 VIEW COMPARISON:  05/11/2019 FINDINGS: Rightward tracheal deviation with upper mediastinal soft tissue density compatible with known goiter. Similar appearance to prior. Atherosclerotic calcification of the aortic arch. Stable vascular calcifications projecting over the left scapula. Borderline enlargement of the cardiopericardial silhouette. Mild blunting of the left posterior costophrenic angle, suggesting trace left pleural effusion. Chronic prominence of left lung pulmonary vasculature especially in the upper lobe, as shown on prior chest CT of/21/20. Thoracic spondylosis. Notable  bilateral degenerative glenohumeral arthropathy. IMPRESSION: 1. Probable trace left pleural effusion. 2. Borderline enlargement of the cardiopericardial silhouette. 3. Chronic prominence of left lung pulmonary vasculature. Electronically Signed   By: Cindra Eves.D.  On: 12/06/2019 12:35   CT Head Wo Contrast  Result Date: 12/07/2019 CLINICAL DATA:  Acute neuro deficit. EXAM: CT HEAD WITHOUT CONTRAST TECHNIQUE: Contiguous axial images were obtained from the base of the skull through the vertex without intravenous contrast. COMPARISON:  CT head 10/05/2017 FINDINGS: Brain: Large territory acute left MCA infarct unchanged. This involves the basal ganglia and frontal parietal lobe and a portion of the temporal lobe. Negative for acute infarct, hemorrhage, mass. Vascular: Negative for hyperdense vessel Skull: Negative Sinuses/Orbits: Mild mucosal edema maxillary sinus bilaterally. Negative orbit Other: None IMPRESSION: No acute abnormality. Large  chronic infarct left MCA territory. Electronically Signed   By: Franchot Gallo M.D.   On: 12/07/2019 20:23   CT Chest Wo Contrast  Result Date: 12/07/2019 CLINICAL DATA:  Shortness of breath EXAM: CT CHEST WITHOUT CONTRAST TECHNIQUE: Multidetector CT imaging of the chest was performed following the standard protocol without IV contrast. COMPARISON:  Chest x-ray from earlier in the same day as well as the previous day. FINDINGS: Cardiovascular: Somewhat limited due to lack of IV contrast. Mild coronary calcifications are seen. Aortic calcifications are noted without aneurysmal dilatation. No cardiac enlargement is noted. Chest wall collaterals are noted likely related to some central venous stenosis. This may be related to the underlying goiter. Mediastinum/Nodes: Thoracic inlet again demonstrates a significant multinodular goiter considerably larger on the left than the right with significant tracheal deviation to the right. This is similar to that seen on  prior plain film examination as well as multiple previous CTs of the chest and neck. Previous treatment for thyrotoxicosis has been performed. No sizable hilar or mediastinal adenopathy is noted. The esophagus is deviated to the right and air filled but otherwise within normal limits. Lungs/Pleura: Lungs are well aerated bilaterally. The previously seen increased density in the left upper lobe is likely related to patient rotation and overlying soft tissue as no focal infiltrate is seen. The calcifications over the left upper lobe are related to changes in the musculature surrounding the left scapula. Scattered areas of scarring are noted similar to that seen on prior CT from 2020. No acute infiltrate or sizable parenchymal nodule is noted. Upper Abdomen: Visualized upper abdomen demonstrates multiple dependent gallstones and gallbladder sludge. The remainder of the upper abdomen is within normal limits. Musculoskeletal: Degenerative changes of the thoracic spine are noted. No acute rib abnormality is noted. Mild compression deformities of L1 through L3 are seen but appear chronic in nature. IMPRESSION: Increased density in the left chest on recent chest x-ray is artifactual in nature related to overlying soft tissue changes. No acute infiltrate is seen. Calcifications seen on recent chest x-ray are related to the muscle surrounding the left scapula. Large multinodular goiter left greater than right similar to that seen on multiple prior CTs dating back to 2012. This has been significantly worked up and treated. No followup recommended (ref: J Am Coll Radiol. 2015 Feb;12(2): 143-50). Chronic scarring in the lungs bilaterally. Aortic Atherosclerosis (ICD10-I70.0). Electronically Signed   By: Inez Catalina M.D.   On: 12/07/2019 20:33   MR BRAIN WO CONTRAST  Result Date: 12/08/2019 CLINICAL DATA:  Right upper extremity weakness and altered mental status EXAM: MRI HEAD WITHOUT CONTRAST MRI CERVICAL SPINE WITHOUT  CONTRAST TECHNIQUE: Multiplanar, multiecho pulse sequences of the brain and surrounding structures, and cervical spine, to include the craniocervical junction and cervicothoracic junction, were obtained without intravenous contrast. COMPARISON:  None. FINDINGS: MRI HEAD FINDINGS Brain: No acute infarct, mass effect or extra-axial collection.  No acute or chronic hemorrhage. There is multifocal hyperintense T2-weighted signal within the white matter. Generalized volume loss without a clear lobar predilection. Additionally, there is a large, old left MCA territory infarct. The midline structures are normal. Vascular: Abnormal left ICA flow void is likely chronic. Skull and upper cervical spine: Normal calvarium and skull base. Visualized upper cervical spine and soft tissues are normal. Sinuses/Orbits:No paranasal sinus fluid levels or advanced mucosal thickening. No mastoid or middle ear effusion. Normal orbits. MRI CERVICAL SPINE FINDINGS Alignment: Physiologic. Vertebrae: No fracture, evidence of discitis, or bone lesion. Cord: Normal signal and morphology. Posterior Fossa, vertebral arteries, paraspinal tissues: Enlarged and heterogeneous thyroid gland. Disc levels: Examination is degraded by motion, limiting assessment of foraminal patency. C2-3: No disc herniation. No spinal canal or neural foraminal stenosis. C3-4: No spinal canal stenosis.  No neural impingement. C4-5: Small central disc protrusion without spinal canal stenosis. No neural impingement. C5-6: Small central disc protrusion without spinal canal stenosis. No neural foraminal stenosis. C6-7: Small disc bulge with narrowing of the ventral thecal sac but no spinal canal stenosis. No neural impingement. C7-T1: No spinal canal stenosis.  No foraminal stenosis. IMPRESSION: 1. No acute intracranial abnormality. 2. Large, old left MCA territory infarct and findings of chronic small vessel disease. 3. Motion degraded examination of the cervical spine. Mild  cervical degenerative disc disease without spinal canal or neural foraminal stenosis. 4. Enlarged and heterogeneous thyroid gland. Recommend thyroid ultrasound (ref: J Am Coll Radiol. 2015 Feb;12(2): 143-50). Electronically Signed   By: Ulyses Jarred M.D.   On: 12/08/2019 03:03   MR CERVICAL SPINE WO CONTRAST  Result Date: 12/08/2019 CLINICAL DATA:  Right upper extremity weakness and altered mental status EXAM: MRI HEAD WITHOUT CONTRAST MRI CERVICAL SPINE WITHOUT CONTRAST TECHNIQUE: Multiplanar, multiecho pulse sequences of the brain and surrounding structures, and cervical spine, to include the craniocervical junction and cervicothoracic junction, were obtained without intravenous contrast. COMPARISON:  None. FINDINGS: MRI HEAD FINDINGS Brain: No acute infarct, mass effect or extra-axial collection. No acute or chronic hemorrhage. There is multifocal hyperintense T2-weighted signal within the white matter. Generalized volume loss without a clear lobar predilection. Additionally, there is a large, old left MCA territory infarct. The midline structures are normal. Vascular: Abnormal left ICA flow void is likely chronic. Skull and upper cervical spine: Normal calvarium and skull base. Visualized upper cervical spine and soft tissues are normal. Sinuses/Orbits:No paranasal sinus fluid levels or advanced mucosal thickening. No mastoid or middle ear effusion. Normal orbits. MRI CERVICAL SPINE FINDINGS Alignment: Physiologic. Vertebrae: No fracture, evidence of discitis, or bone lesion. Cord: Normal signal and morphology. Posterior Fossa, vertebral arteries, paraspinal tissues: Enlarged and heterogeneous thyroid gland. Disc levels: Examination is degraded by motion, limiting assessment of foraminal patency. C2-3: No disc herniation. No spinal canal or neural foraminal stenosis. C3-4: No spinal canal stenosis.  No neural impingement. C4-5: Small central disc protrusion without spinal canal stenosis. No neural  impingement. C5-6: Small central disc protrusion without spinal canal stenosis. No neural foraminal stenosis. C6-7: Small disc bulge with narrowing of the ventral thecal sac but no spinal canal stenosis. No neural impingement. C7-T1: No spinal canal stenosis.  No foraminal stenosis. IMPRESSION: 1. No acute intracranial abnormality. 2. Large, old left MCA territory infarct and findings of chronic small vessel disease. 3. Motion degraded examination of the cervical spine. Mild cervical degenerative disc disease without spinal canal or neural foraminal stenosis. 4. Enlarged and heterogeneous thyroid gland. Recommend thyroid ultrasound (ref: J Am Coll Radiol.  2015 Feb;12(2): 143-50). Electronically Signed   By: Ulyses Jarred M.D.   On: 12/08/2019 03:03    EKG: Independently reviewed.  Normal sinus rhythm low voltage.  Nonspecific ST changes.  Assessment/Plan Principal Problem:   Weakness Active Problems:   History of pulmonary embolism   Cerebral infarction due to embolism of left middle cerebral artery (HCC)   Essential thrombocytosis (HCC)   Acute lower UTI   Hypernatremia    1. Generalized weakness more on the right side with previous stroke MRI brain brain and neck is unremarkable.  Suspect patient's weakness could have been exacerbated by dehydration and UTI.  On empiric antibiotics follow cultures on IV fluids. 2. Cough concerning for aspiration we will get speech therapy evaluation for swallow.  On empiric antibiotics for now. 3. Hypernatremia and dehydration for which patient is on D5W at this time. 4. Elevated BNP levels but patient clinically looks dehydrated.  Since patient has low voltage EKG will check 2D echo. 5. UTI on empiric antibiotics. 6. History of essential thrombocytosis on hydroxyurea.  Platelet count has increased from baseline.  May discuss with patient's oncologist in the morning. 7. History of PE on Coumadin.  If patient fails swallow then will need IV heparin.  Family  was not keen on starting Lovenox.  INR is therapeutic at this time. 8. History of previous stroke with right-sided hemiplegia presently on Coumadin. 9. Stage IV sacral decubitus ulcer no active discharge seen.  Wound team consulted.   DVT prophylaxis: Coumadin. Code Status: DO NOT INTUBATE as confirmed with patient's daughter. Family Communication: Patient's daughter. Disposition Plan: Home.  Stable. Consults called: Speech therapy and wound consult. Admission status: Observation.   Rise Patience MD [facility] Pager [phone].  If 7PM-7AM, please contact night-coverage [email or web address] Password [facility]  12/08/2019, 4:29 AM

## 2019-12-08 NOTE — Consult Note (Signed)
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 Patient is being followed by the wound clinic in [facility].  Pressure Injury POA: Yes Measurement:  Wound #1: right upper buttock 2 cm x 1 cm x 3.5 cm tunneling @ 4-5 o'clock Wound #2: Left upper buttock 1 cm x 2 cm x 5.6 cm tunneling @ 7:00 Wound #3: 2 cm x 2.5 cm x 2.4 cm tunneling @ 7-9 o'clock Wound bed: These wounds are pink, red, moist and draining serous drainage. Drainage (amount, consistency, odor)  Periwound: Pink, brown Dressing procedure/placement/frequency: [place] wound clinic has been treating these wounds with Vashe (Not in [place]) and Collagen (Not in [place]). Information from [facility] wound care clinic obtained in chart from [place]. Explained to the daughters that we do not have the same supplies in our formulary and previous treatment from the wound care clinic can resume once the patient has been discharged.  Clean buttocks with no rinse cleaner. Pack each tunneling wound with saline moistened 1" iodoform strips Kellie Simmering # [phone]) and cover with ABD pads, secure with Medipore tape. Change Q Shift.  Monitor the wound area(s) for worsening of condition such as: Signs/symptoms of infection, increase in size, development of or worsening of odor, development of pain, or increased pain at the affected locations.   Notify the medical team if any of these develop.  Thank you for the consult. [place] nurse will not follow at this time.   Please re-consult the [place] team if needed.  Cathlean Marseilles Tamala Julian, MSN, RN, [place], Lysle Pearl, [facility] Wound Treatment Associate Pager [phone]

## 2019-12-08 NOTE — Plan of Care (Signed)

## 2019-12-08 NOTE — Progress Notes (Signed)
Multiple decubs noted at the sacrum/buttocks/rt groin area while cleaning up pt. Not able to do measurement/dsg placement as daughter refusing rt now. Said to wait for [place] this AM.

## 2019-12-08 NOTE — Progress Notes (Signed)
ANTICOAGULATION CONSULT NOTE - Initial Consult  Pharmacy Consult for Warfarin  Indication: History of PE/DVT, stroke  Allergies  Allergen Reactions  . Valtrex [Valacyclovir Hcl] Cough    Cough and gagging     Vital Signs: Temp: 97.6 F (36.4 C) (12/03 0249) Temp Source: Oral (12/03 0249) BP: 111/67 (12/03 0249) Pulse Rate: 82 (12/03 0249)  Labs: Recent Labs    12/06/19 1210 12/06/19 1210 12/07/19 1608 12/07/19 1608 12/07/19 2026 12/07/19 2324 12/08/19 0601 12/08/19 0658  HGB 13.5   < > 13.1   < >  --  14.3  --  12.8  HCT 42.9   < > 42.6  --   --  42.0  --  41.2  PLT 868.0*  --  1,031*  --   --   --   --  1,026*  LABPROT  --   --   --   --  33.0*  --  36.7*  --   INR  --   --   --   --  3.4*  --  3.9*  --   CREATININE 0.42  --  0.31*  --   --   --   --  <0.30*  TROPONINIHS  --   --   --   --   --   --   --  8   < > = values in this interval not displayed.    CrCl cannot be calculated (This lab value cannot be used to calculate CrCl because it is not a number: <0.30).   Medical History: Past Medical History:  Diagnosis Date  . ARTHRITIS   . Arthritis   . Diverticulosis   . Essential thrombocytosis ([place])   . HYPERTENSION   . HYPERTHYROIDISM   . Hyperthyroidism    s/p I-131 ablation 03/2011 of multinod goiter  . INSOMNIA   . MIGRAINE HEADACHE   . OBESITY   . Posttraumatic stress disorder   . Pulmonary embolism ([place]) 03/2014   with DVT  . Stroke [facility]) 03/2014   dysarthria    Assessment: 86 y/o F on warfarin PTA for hx of PE/DVT/stroke presenting with CAP vs UTI. Pharmacy consulted for warfarin dosing. INR supratherapeutic 3.4 on admit.  INR up to 3.9 despite holding dose, patient with poor PO intake recently.  H/H stable, plt chronically high.   PTA Warfarin: per 11/17 visit supposed to be on 2g TTS, 4mg  AOD, dtr reported 2mg  Sun/Thr, 4mg  AOD on med rec  Goal of Therapy:  INR 2-3 Monitor platelets by anticoagulation protocol: Yes   Plan:  Hold  warfarin today Monitor daily INR, CBC/plt Monitor for signs/symptoms of bleeding   Benetta Spar, PharmD, BCPS, BCCP Clinical Pharmacist  Please check AMION for all [place] phone numbers After 10:00 PM, call [place]

## 2019-12-08 NOTE — ED Notes (Signed)
Multiple pressure wounds greater than 2 noted to sacral and gluteal areas.

## 2019-12-08 NOTE — Plan of Care (Signed)
  Problem: Education: Goal: Knowledge of General Education information will improve Description Including pain rating scale, medication(s)/side effects and non-pharmacologic comfort measures Outcome: Progressing   

## 2019-12-08 NOTE — Progress Notes (Signed)
ANTICOAGULATION CONSULT NOTE - Initial Consult  Pharmacy Consult for Warfarin  Indication: History of PE/DVT, stroke  Allergies  Allergen Reactions  . Valtrex [Valacyclovir Hcl] Cough    Cough and gagging     Vital Signs: Temp: 97.6 F (36.4 C) (12/03 0249) Temp Source: Oral (12/03 0249) BP: 111/67 (12/03 0249) Pulse Rate: 82 (12/03 0249)  Labs: Recent Labs    12/06/19 1210 12/06/19 1210 12/07/19 1608 12/07/19 2026 12/07/19 2324  HGB 13.5   < > 13.1  --  14.3  HCT 42.9  --  42.6  --  42.0  PLT 868.0*  --  1,031*  --   --   LABPROT  --   --   --  33.0*  --   INR  --   --   --  3.4*  --   CREATININE 0.42  --  0.31*  --   --    < > = values in this interval not displayed.    Estimated Creatinine Clearance: 44.6 mL/min (A) (by C-G formula based on SCr of 0.31 mg/dL (L)).   Medical History: Past Medical History:  Diagnosis Date  . ARTHRITIS   . Arthritis   . Diverticulosis   . Essential thrombocytosis ([place])   . HYPERTENSION   . HYPERTHYROIDISM   . Hyperthyroidism    s/p I-131 ablation 03/2011 of multinod goiter  . INSOMNIA   . MIGRAINE HEADACHE   . OBESITY   . Posttraumatic stress disorder   . Pulmonary embolism ([place]) 03/2014   with DVT  . Stroke [facility]) 03/2014   dysarthria    Assessment: 86 y/o F on warfarin PTA for hx of PE/DVT/stroke. Presents to the ED with shortness of breath. Continuing warfarin. INR was supra-therapeutic yesterday evening at 3.4. No INR this AM-will order.   Goal of Therapy:  INR 2-3 Monitor platelets by anticoagulation protocol: Yes   Plan:  Re-check INR this AM   Narda Bonds, PharmD, BCPS Clinical Pharmacist Phone: [phone]

## 2019-12-08 NOTE — Progress Notes (Signed)
PROGRESS NOTE  Sanaya Gwilliam ACZ:[id number] DOB: 11/16/1932 DOA: 12/07/2019 PCP: [name], MD  Brief History    Sandy Barrett is a 86 y.o. female with history of stroke with right-sided hemiplegia, history of pulmonary embolism on Coumadin, essential thrombocytosis on hydroxyurea was brought to the ER after patient's family found that patient was having weakness on the right upper and lower extremity.  Patient has previous stroke and is hemiplegic on the right side but as per the family the right side is usually spastic but became more flaccid.  In addition patient also was found to be having some cough.  Given the symptoms patient was brought to the ER.  ED Course: In the ER patient was afebrile labs are significant for sodium of 148 WBC of 20.9 platelets of thousand.  BNP was 1700 lactic acid was normal.  CT head and CT chest was unremarkable.  MRI brain and MRA neck was done which were unremarkable.  Patient's UA was concerning for UTI was started on empiric antibiotics.  Patient also was empirically start on antibiotics for possible aspiration.  Patient started on gentle hydration admitted for further management of weakness cough.  Covid test was negative.  EKG shows normal sinus rhythm low voltage.  Nonspecific ST changes.  The patient has been admitted to a telemetry bed. She is receiving IV antibiotics and IV fluids.   Consultants  . Wound care.  Procedures  . None  Antibiotics   Anti-infectives (From admission, onward)   Start     Dose/Rate Route Frequency Ordered Stop   12/08/19 1800  cefTRIAXone (ROCEPHIN) 2 g in sodium chloride 0.9 % 100 mL IVPB        2 g 200 mL/hr over 30 Minutes Intravenous Every 24 hours 12/08/19 0428     12/08/19 1800  azithromycin (ZITHROMAX) 500 mg in sodium chloride 0.9 % 250 mL IVPB        500 mg 250 mL/hr over 60 Minutes Intravenous Every 24 hours 12/08/19 0428     12/07/19 2015  cefTRIAXone (ROCEPHIN) 1 g in sodium chloride 0.9 % 100 mL  IVPB        1 g 200 mL/hr over 30 Minutes Intravenous  Once 12/07/19 2012 12/07/19 2119   12/07/19 2015  azithromycin (ZITHROMAX) 500 mg in sodium chloride 0.9 % 250 mL IVPB        500 mg 250 mL/hr over 60 Minutes Intravenous  Once 12/07/19 2012 12/07/19 2232    .   Subjective  The patient is resting quietly in bed. Family at bedside.  Objective   Vitals:  Vitals:   12/08/19 0249 12/08/19 1724  BP: 111/67 (!) 130/48  Pulse: 82 92  Resp: 16 17  Temp: 97.6 F (36.4 C) 97.9 F (36.6 C)  SpO2: 95% 100%   Exam:  Constitutional:  . The patient is awake and alert. No acute distress. Respiratory:  . No increased work of breathing. . No wheezes, rales, or rhonchi . No tactile fremitus Cardiovascular:  . Regular rate and rhythm . No murmurs, ectopy, or gallups. . No lateral PMI. No thrills. Abdomen:  . Abdomen is soft, non-tender, non-distended . No hernias, masses, or organomegaly . Normoactive bowel sounds.  Musculoskeletal:  . No cyanosis, clubbing, or edema Skin:  . No rashes, lesions, ulcers . palpation of skin: no induration or nodules Neurologic:  . CN 2-12 intact . Sensation all 4 extremities intact Psychiatric:  . Mental status o Mood, affect appropriate o Orientation to person,  place, time  . judgment and insight appear intact  I have personally reviewed the following:   Today's Data  . Vitals, CBC, CMP  Lab Data  . UA positive for UTI.  Micro Data  . Blood cultures x 2: No growth . Urine culture: pending.  Imaging  . CT chest . CT head . CXR  Scheduled Meds: . acidophilus  2 capsule Oral Daily  . azelastine  2 spray Each Nare BID  . [START ON 12/09/2019] hydroxyurea  1,000 mg Oral Once per day on Sun Wed Sat  . hydroxyurea  500 mg Oral Once per day on Mon Tue Thu Fri  . omega-3 acid ethyl esters  1 capsule Oral Daily  . pantoprazole  40 mg Oral BID  . pregabalin  50 mg Oral TID  . propranolol  5 mg Oral BID   Continuous Infusions: .  azithromycin    . cefTRIAXone (ROCEPHIN)  IV    . dextrose 75 mL/hr at 12/08/19 0603    Principal Problem:   Weakness Active Problems:   History of pulmonary embolism   Cerebral infarction due to embolism of left middle cerebral artery (HCC)   Essential thrombocytosis (HCC)   Pressure injury of skin   Acute lower UTI   Hypernatremia   LOS: 0 days   A & P   Generalized weakness more on the right side with previous stroke MRI brain brain and neck is unremarkable.  Suspect patient's weakness could have been exacerbated by dehydration and UTI.  On empiric antibiotics follow cultures on IV fluids.  Cough concerning for aspiration we will get speech therapy evaluation for swallow.  On empiric antibiotics for now. Very little evidence for pneumonia on imagery. Blood cultures with no growth.  Hypernatremia and dehydration for which patient is on D5W at this time. Monitor volume status and electrolytes.  Elevated BNP levels but patient clinically looks dehydrated.  Since patient has low voltage EKG will check 2D echo.  UTI on empiric antibiotics. Urine culture pending.  History of essential thrombocytosis on hydroxyurea.  Platelet count has increased from baseline.   History of PE on Coumadin.  If patient fails swallow then will need IV heparin.  Family was not keen on starting Lovenox.  INR is supertherapeutic at this time. Monitor.  History of previous stroke with right-sided hemiplegia presently on Coumadin.  Stage IV sacral decubitus ulcer no active discharge seen.  Wound team consulted. I appreciate their help.  Moderate protein calorie malnutrtion: Nutrition consulted.  I have seen and examined this patient myself. I have spent 35 minutes in her evaluation and care.  DVT prophylaxis: Coumadin. Code Status: DO NOT INTUBATE as confirmed with patient's daughter. Family Communication: Patient's daughter. Disposition Plan: Home.  Stable. Georganne Siple, DO [facility] Direct contact: see [email or web address]  7PM-7AM contact night coverage as above 12/08/2019, 5:50 PM  LOS: 0 days

## 2019-12-08 NOTE — ED Notes (Signed)
Pt to MRI

## 2019-12-09 ENCOUNTER — Inpatient Hospital Stay (HOSPITAL_COMMUNITY): Payer: Medicare Other

## 2019-12-09 DIAGNOSIS — R0602 Shortness of breath: Secondary | ICD-10-CM | POA: Diagnosis not present

## 2019-12-09 LAB — BASIC METABOLIC PANEL
Anion gap: 15 (ref 5–15)
BUN: 11 mg/dL (ref 8–23)
CO2: 19 mmol/L — ABNORMAL LOW (ref 22–32)
Calcium: 8.5 mg/dL — ABNORMAL LOW (ref 8.9–10.3)
Chloride: 106 mmol/L (ref 98–111)
Creatinine, Ser: 0.34 mg/dL — ABNORMAL LOW (ref 0.44–1.00)
GFR, Estimated: >60 mL/min (ref >60)
Glucose, Bld: 120 mg/dL — ABNORMAL HIGH (ref 70–99)
Potassium: 3.2 mmol/L — ABNORMAL LOW (ref 3.5–5.1)
Sodium: 140 mmol/L (ref 135–145)

## 2019-12-09 LAB — CBC
HCT: 42.3 % (ref 36.0–46.0)
Hemoglobin: 12.7 g/dL (ref 12.0–15.0)
MCH: 30.8 pg (ref 26.0–34.0)
MCHC: 30 g/dL (ref 30.0–36.0)
MCV: 102.7 fL — ABNORMAL HIGH (ref 80.0–100.0)
Platelets: 1009 10*3/uL (ref 150–400)
RBC: 4.12 MIL/uL (ref 3.87–5.11)
RDW: 18.8 % — ABNORMAL HIGH (ref 11.5–15.5)
WBC: 28.2 10*3/uL — ABNORMAL HIGH (ref 4.0–10.5)
nRBC: 0.2 % (ref 0.0–0.2)

## 2019-12-09 LAB — ECHOCARDIOGRAM COMPLETE
Area-P 1/2: 2.22 cm2
Height: 59 in
S' Lateral: 2.6 cm
Weight: 2560.02 oz

## 2019-12-09 LAB — PROTIME-INR
INR: 4.4 (ref 0.8–1.2)
Prothrombin Time: 40.8 seconds — ABNORMAL HIGH (ref 11.4–15.2)

## 2019-12-09 LAB — PATHOLOGIST SMEAR REVIEW

## 2019-12-09 MED ORDER — ASCORBIC ACID 500 MG PO TABS
500.0000 mg | ORAL_TABLET | Freq: Every day | ORAL | Status: DC
  Administered 2019-12-09 – 2019-12-13 (×5): 500 mg via ORAL
  Filled 2019-12-09 (×5): qty 1

## 2019-12-09 MED ORDER — ENSURE MAX PROTEIN PO LIQD
11.0000 [oz_av] | Freq: Every day | ORAL | Status: DC
  Administered 2019-12-09 – 2019-12-11 (×3): 11 [oz_av] via ORAL
  Filled 2019-12-09 (×3): qty 330

## 2019-12-09 MED ORDER — WARFARIN - PHARMACIST DOSING INPATIENT: Freq: Every day | Status: DC

## 2019-12-09 MED ORDER — SALINE SPRAY 0.65 % NA SOLN
2.0000 | NASAL | Status: DC | PRN
  Filled 2019-12-09: qty 44

## 2019-12-09 MED ORDER — SODIUM CHLORIDE 0.45 % IV SOLN: INTRAVENOUS | Status: DC

## 2019-12-09 MED ORDER — PERFLUTREN LIPID MICROSPHERE
1.0000 mL | INTRAVENOUS | Status: AC | PRN
  Filled 2019-12-09: qty 10

## 2019-12-09 MED ORDER — ZINC SULFATE 220 (50 ZN) MG PO CAPS
220.0000 mg | ORAL_CAPSULE | Freq: Every day | ORAL | Status: DC
  Administered 2019-12-09 – 2019-12-13 (×5): 220 mg via ORAL
  Filled 2019-12-09 (×5): qty 1

## 2019-12-09 MED ORDER — DOXYCYCLINE HYCLATE 100 MG PO TABS
100.0000 mg | ORAL_TABLET | Freq: Two times a day (BID) | ORAL | Status: DC
  Administered 2019-12-09 – 2019-12-12 (×6): 100 mg via ORAL
  Filled 2019-12-09 (×7): qty 1

## 2019-12-09 NOTE — Progress Notes (Signed)
Patient noted to have have 3 stage 3 with tunnelling and undermining and 2 stage 2's on her buttocks. Stage 3 sites were packed with NS wet Iodoform and Stage 2's were covered with Mepilex

## 2019-12-09 NOTE — Progress Notes (Signed)
CRITICAL VALUE ALERT  Critical Value:  INR 4.4  Date & Time Notied:  12/09/2019 0518  Provider Notified: On call - Amion  Orders Received/Actions taken: no new orders at this time.

## 2019-12-09 NOTE — Progress Notes (Signed)
PROGRESS NOTE  Sandy Barrett MPN:[id number] DOB: [birth year] DOA: 12/07/2019 PCP: [name], MD  Brief History    Sandy Barrett is a 86 y.o. female with history of stroke with right-sided hemiplegia, history of pulmonary embolism on Coumadin, essential thrombocytosis on hydroxyurea was brought to the ER after patient's family found that patient was having weakness on the right upper and lower extremity.  Patient has previous stroke and is hemiplegic on the right side but as per the family the right side is usually spastic but became more flaccid.  In addition patient also was found to be having some cough.  Given the symptoms patient was brought to the ER.  ED Course: In the ER patient was afebrile labs are significant for sodium of 148 WBC of 20.9 platelets of thousand.  BNP was 1700 lactic acid was normal.  CT head and CT chest was unremarkable.  MRI brain and MRA neck was done which were unremarkable.  Patient's UA was concerning for UTI was started on empiric antibiotics.  Patient also was empirically start on antibiotics for possible aspiration.  Patient started on gentle hydration admitted for further management of weakness cough.  Covid test was negative.  EKG shows normal sinus rhythm low voltage.  Nonspecific ST changes.  The patient has been admitted to a telemetry bed. She is receiving IV antibiotics and IV fluids.   Wound care nurse has evaluated the patient and documented that the patient has 3 stage 3 wounds on her buttocks with tunneling and undermining and 2 stage 2's on her buttocks. She has packed the stage 3 sites with NS wet iodoform and stage 2 were covered with Mepilex.  Consultants  . Wound care.  Procedures  . None  Antibiotics   Anti-infectives (From admission, onward)   Start     Dose/Rate Route Frequency Ordered Stop   12/09/19 1800  doxycycline (VIBRA-TABS) tablet 100 mg        100 mg Oral Every 12 hours 12/09/19 1336     12/08/19 1800  cefTRIAXone  (ROCEPHIN) 2 g in sodium chloride 0.9 % 100 mL IVPB        2 g 200 mL/hr over 30 Minutes Intravenous Every 24 hours 12/08/19 0428     12/08/19 1800  azithromycin (ZITHROMAX) 500 mg in sodium chloride 0.9 % 250 mL IVPB  Status:  Discontinued        500 mg 250 mL/hr over 60 Minutes Intravenous Every 24 hours 12/08/19 0428 12/09/19 1336   12/07/19 2015  cefTRIAXone (ROCEPHIN) 1 g in sodium chloride 0.9 % 100 mL IVPB        1 g 200 mL/hr over 30 Minutes Intravenous  Once 12/07/19 2012 12/07/19 2119   12/07/19 2015  azithromycin (ZITHROMAX) 500 mg in sodium chloride 0.9 % 250 mL IVPB        500 mg 250 mL/hr over 60 Minutes Intravenous  Once 12/07/19 2012 12/07/19 2232      Subjective  The patient is resting quietly in bed. Family at bedside.  Objective   Vitals:  Vitals:   12/09/19 0532 12/09/19 1603  BP: 131/86 129/75  Pulse: 81 84  Resp: 17 19  Temp: 98.5 F (36.9 C) 98.9 F (37.2 C)  SpO2: 99% 99%   Exam:  Constitutional:  . The patient is awake and alert. No acute distress. Respiratory:  . No increased work of breathing. . No wheezes, rales, or rhonchi . No tactile fremitus Cardiovascular:  . Regular rate and rhythm .  No murmurs, ectopy, or gallups. . No lateral PMI. No thrills. Abdomen:  . Abdomen is soft, non-tender, non-distended . No hernias, masses, or organomegaly . Normoactive bowel sounds.  Musculoskeletal:  . No cyanosis, clubbing, or edema Skin:  . No rashes, lesions, ulcers . palpation of skin: no induration or nodules . Stage 2 and 3 wounds to buttocks Neurologic:  . CN 2-12 intact . Sensation all 4 extremities intact Psychiatric:  . Mental status o Mood, affect appropriate o Orientation to person, place, time  . judgment and insight appear intact  I have personally reviewed the following:   Today's Data  . Vitals, CBC, CMP  Lab Data  . UA positive for UTI.  Micro Data  . Blood cultures x 2: No growth . Urine culture:  pending.  Imaging  . CT chest . CT head . CXR  Scheduled Meds: . acidophilus  2 capsule Oral Daily  . azelastine  2 spray Each Nare BID  . doxycycline  100 mg Oral Q12H  . hydroxyurea  1,000 mg Oral Once per day on Sun Wed Sat  . hydroxyurea  500 mg Oral Once per day on Mon Tue Thu Fri  . omega-3 acid ethyl esters  1 capsule Oral Daily  . pantoprazole  40 mg Oral BID  . pregabalin  50 mg Oral TID  . propranolol  5 mg Oral BID  . Warfarin - Pharmacist Dosing Inpatient   Does not apply q1600   Continuous Infusions: . sodium chloride 75 mL/hr at 12/09/19 1305  . cefTRIAXone (ROCEPHIN)  IV 2 g (12/08/19 1730)    Principal Problem:   Weakness Active Problems:   History of pulmonary embolism   Cerebral infarction due to embolism of left middle cerebral artery (HCC)   Essential thrombocytosis (HCC)   Pressure injury of skin   Acute lower UTI   Hypernatremia   Severe sepsis (HCC)   LOS: 1 day   A & P   Generalized weakness more on the right side with previous stroke MRI brain brain and neck is unremarkable.  Suspect patient's weakness could have been exacerbated by dehydration and UTI.  On empiric antibiotics follow cultures on IV fluids.  Leukocytosis/Thrombocytosis: 28K. Out of scale for what would be expected for UTI and/or simple infection. Pt has known hematological issue for which she sees Dr. Alvy Bimler. This is primarily her throbocytosis. The most recent office note from Dr. Alvy Bimler is from April of 2021. It does not elevated WBC, but states that it is though to be reactive and secondary to the patient's ducubitus ulcers. All appointments following that have been cancelled. Pt's hydrea was continued at her current dosage out of concern for pancytopenia.   Cough concerning for aspiration we will get speech therapy evaluation for swallow.  On empiric antibiotics for now. Very little evidence for pneumonia on imagery. Blood cultures with no growth.  Stage 2 and 3 wounds to  buttocks: (3) stage 3 with tunneling and undermining. 2 stage 2. Wound care has packed the stage 3 sites with NS wet iodoform and stage 2 were covered with Mepilex. Per wound care they are clean and without active discharge. No necrosis or foul smelling drainage.   Hypernatremia and dehydration for which patient is on D5W at this time. Monitor volume status and electrolytes.  Elevated BNP levels but patient clinically looks dehydrated.  Since patient has low voltage EKG will check 2D echo.  UTI on empiric antibiotics. Urine culture pending.  History of essential thrombocytosis  on hydroxyurea.  Platelet count has increased from baseline.   History of PE on Coumadin.  If patient fails swallow then will need IV heparin.  Family was not keen on starting Lovenox.  INR is supertherapeutic at this time. Monitor.  History of previous stroke with right-sided hemiplegia presently on Coumadin.  Moderate protein calorie malnutrtion: Nutrition consulted.  I have seen and examined this patient myself. I have spent 38 minutes in her evaluation and care. Family at bedisde. All questions answered to the best of my ability.  DVT prophylaxis: Coumadin. Code Status: DO NOT INTUBATE as confirmed with patient's daughter. Family Communication: Patient's daughter and sister at bedside. Disposition Plan: Home.  Stable. Abed Schar, DO [facility] Direct contact: see [email or web address]  7PM-7AM contact night coverage as above 12/09/2019, 4:54 PM  LOS: 0 days

## 2019-12-09 NOTE — Progress Notes (Signed)
ANTICOAGULATION CONSULT NOTE - Follow Up Consult  Pharmacy Consult for Warfarin  Indication: History of PE/DVT, stroke  Allergies  Allergen Reactions  . Valtrex [Valacyclovir Hcl] Cough    Cough and gagging     Vital Signs: Temp: 98.5 F (36.9 C) (12/04 0532) Temp Source: Oral (12/04 0532) BP: 131/86 (12/04 0532) Pulse Rate: 81 (12/04 0532)  Labs: Recent Labs    12/06/19 1210 12/06/19 1210 12/07/19 1608 12/07/19 1608 12/07/19 2026 12/07/19 2324 12/07/19 2324 12/08/19 0601 12/08/19 0658 12/08/19 0950 12/09/19 0335  HGB 13.5   < > 13.1   < >  --  14.3   < >  --  12.8  --  12.7  HCT 42.9   < > 42.6   < >  --  42.0  --   --  41.2  --  42.3  PLT 868.0*   < > 1,031*  --   --   --   --   --  1,026*  --  1,009*  LABPROT  --   --   --   --  33.0*  --   --  36.7*  --   --  40.8*  INR  --   --   --   --  3.4*  --   --  3.9*  --   --  4.4*  CREATININE 0.42  --  0.31*  --   --   --   --   --  <0.30*  --   --   TROPONINIHS  --   --   --   --   --   --   --   --  8 5  --    < > = values in this interval not displayed.    CrCl cannot be calculated (This lab value cannot be used to calculate CrCl because it is not a number: <0.30).   Medical History: Past Medical History:  Diagnosis Date  . ARTHRITIS   . Arthritis   . Diverticulosis   . Essential thrombocytosis ([place])   . HYPERTENSION   . HYPERTHYROIDISM   . Hyperthyroidism    s/p I-131 ablation 03/2011 of multinod goiter  . INSOMNIA   . MIGRAINE HEADACHE   . OBESITY   . Posttraumatic stress disorder   . Pulmonary embolism ([place]) 03/2014   with DVT  . Stroke [facility]) 03/2014   dysarthria    Assessment: 86 y/o F presented on 12/07/2019 with R sided weakness found to have concern for UTI and possible aspiration. Patient is on warfarin prior to admission for history of PE/DVT/stroke. Pharmacy consulted for warfarin dosing. INR supratherapeutic at 3.4 on admission.  INR up to 4.4 despite holding dose, patient with poor PO  intake recently.  H/H stable, plt chronically high. Per RN no reported bleeding.   PTA Warfarin: per 11/17 visit supposed to be on 2g TTS, 4mg  AOD, daughter reported 2mg  Sun/Thr, 4mg  AOD to pharmacy technician during med rec.  Goal of Therapy:  INR 2-3 Monitor platelets by anticoagulation protocol: Yes   Plan:  Continue to hold warfarin today Monitor daily INR, CBC/plt Monitor for signs/symptoms of bleeding  Will discuss drug drug interaction with azithromycin and warfarin and increased risk of bleeding with Dr. Murrell Converse, PharmD Clinical Pharmacist

## 2019-12-09 NOTE — Progress Notes (Signed)
At shift change, around 1900, patient's daughters became very concerned for patient occasional labored breathing.  They believe that it is more pronounced and often.  Vitals were taken at 1900.  Vitals were all stable.  Patient is alert but appeared to be tired.  Breathing treatment was done.  Patient did have some expiratory wheezing.  Patient's daughters would like to speak with MD when rounding in the morning.    Patient was assessed by staff and charge nurse.  Will will continue to monitor.

## 2019-12-09 NOTE — Progress Notes (Signed)
  Echocardiogram 2D Echocardiogram with definity has been performed.  Sandy Barrett M 12/09/2019, 11:10 AM

## 2019-12-10 DIAGNOSIS — I69320 Aphasia following cerebral infarction: Secondary | ICD-10-CM | POA: Diagnosis not present

## 2019-12-10 DIAGNOSIS — L89323 Pressure ulcer of left buttock, stage 3: Secondary | ICD-10-CM | POA: Diagnosis not present

## 2019-12-10 DIAGNOSIS — M150 Primary generalized (osteo)arthritis: Secondary | ICD-10-CM | POA: Diagnosis not present

## 2019-12-10 DIAGNOSIS — L89153 Pressure ulcer of sacral region, stage 3: Secondary | ICD-10-CM | POA: Diagnosis not present

## 2019-12-10 DIAGNOSIS — I972 Postmastectomy lymphedema syndrome: Secondary | ICD-10-CM | POA: Diagnosis not present

## 2019-12-10 DIAGNOSIS — I69351 Hemiplegia and hemiparesis following cerebral infarction affecting right dominant side: Secondary | ICD-10-CM | POA: Diagnosis not present

## 2019-12-10 LAB — CBC WITH DIFFERENTIAL/PLATELET
Abs Immature Granulocytes: 0.39 10*3/uL — ABNORMAL HIGH (ref 0.00–0.07)
Basophils Absolute: 0.1 10*3/uL (ref 0.0–0.1)
Basophils Relative: 1 %
Eosinophils Absolute: 0.4 10*3/uL (ref 0.0–0.5)
Eosinophils Relative: 2 %
HCT: 41.5 % (ref 36.0–46.0)
Hemoglobin: 12.4 g/dL (ref 12.0–15.0)
Immature Granulocytes: 2 %
Lymphocytes Relative: 6 %
Lymphs Abs: 1.3 10*3/uL (ref 0.7–4.0)
MCH: 31.1 pg (ref 26.0–34.0)
MCHC: 29.9 g/dL — ABNORMAL LOW (ref 30.0–36.0)
MCV: 104 fL — ABNORMAL HIGH (ref 80.0–100.0)
Monocytes Absolute: 0.5 10*3/uL (ref 0.1–1.0)
Monocytes Relative: 2 %
Neutro Abs: 18.6 10*3/uL — ABNORMAL HIGH (ref 1.7–7.7)
Neutrophils Relative %: 87 %
Platelets: 1031 10*3/uL (ref 150–400)
RBC: 3.99 MIL/uL (ref 3.87–5.11)
RDW: 19 % — ABNORMAL HIGH (ref 11.5–15.5)
WBC: 21.3 10*3/uL — ABNORMAL HIGH (ref 4.0–10.5)
nRBC: 0.2 % (ref 0.0–0.2)

## 2019-12-10 LAB — BASIC METABOLIC PANEL
Anion gap: 11 (ref 5–15)
BUN: 11 mg/dL (ref 8–23)
CO2: 24 mmol/L (ref 22–32)
Calcium: 8.6 mg/dL — ABNORMAL LOW (ref 8.9–10.3)
Chloride: 105 mmol/L (ref 98–111)
Creatinine, Ser: 0.36 mg/dL — ABNORMAL LOW (ref 0.44–1.00)
GFR, Estimated: >60 mL/min (ref >60)
Glucose, Bld: 91 mg/dL (ref 70–99)
Potassium: 4.7 mmol/L (ref 3.5–5.1)
Sodium: 140 mmol/L (ref 135–145)

## 2019-12-10 LAB — PROTIME-INR
INR: 4 — ABNORMAL HIGH (ref 0.8–1.2)
Prothrombin Time: 38.1 seconds — ABNORMAL HIGH (ref 11.4–15.2)

## 2019-12-10 NOTE — Evaluation (Signed)
Physical Therapy Evaluation Patient Details Name: Sandy Barrett MRN: [id number] DOB: Sep 23, 1932 Today's Date: 12/10/2019   History of Present Illness  [name] Eckstein is a 86 y.o. female with history of stroke with right-sided hemiplegia, history of pulmonary embolism, essential thrombocytosis. Pt  was having weakness on the right upper and lower extremity.  Patient has previous stroke and is hemiplegic on the right side but as per the family the right side is usually spastic but became more flaccid. MRA/MRI brain negative for acute CVA. UTI noted.  Clinical Impression  Patient received in bed, lethargic in appearance. Daughter present. Patient is apahasic, will respond occasionally with head nod. Otherwise is very difficult to understand. She requires max assist +2 with all bed mobility at this time.  Minimal use of R side. Very weak left side. Patient will continue to benefit from skilled PT while here to improve strength to reduce caregiver burden and return to prior functional level.      Follow Up Recommendations SNF;Supervision/Assistance - 24 hour    Equipment Recommendations  None recommended by PT    Recommendations for Other Services       Precautions / Restrictions Precautions Precautions: Fall Precaution Comments: R side flaccid from previous CVA, multiple sacral wounds Restrictions Weight Bearing Restrictions: No      Mobility  Bed Mobility Overal bed mobility: Needs Assistance Bed Mobility: Supine to Sit;Sit to Supine;Rolling Rolling: Max assist;+2 for physical assistance;+2 for safety/equipment Sidelying to sit: Max assist;+2 for physical assistance;+2 for safety/equipment Supine to sit: Max assist;+2 for physical assistance;+2 for safety/equipment Sit to supine: Max assist;+2 for physical assistance;+2 for safety/equipment   General bed mobility comments: Pt can initiate minimal rolling to R side with L hand, but pt mostly maxA +2 overall for bed mobility. Can  sit EOB with minA overall to avoid leaning on R side.    Transfers                 General transfer comment: DNT will need maximove  Ambulation/Gait             General Gait Details: patient is non ambulatory at baseline  Stairs            Wheelchair Mobility    Modified Rankin (Stroke Patients Only)       Balance Overall balance assessment: Needs assistance Sitting-balance support: Feet supported Sitting balance-Leahy Scale: Poor Sitting balance - Comments: minA for stability, close guarding                                     Pertinent Vitals/Pain Pain Assessment: Faces Faces Pain Scale: Hurts little more Pain Location: all over with movement Pain Descriptors / Indicators: Grimacing;Discomfort Pain Intervention(s): Limited activity within patient's tolerance;Monitored during session;Repositioned    Home Living Family/patient expects to be discharged to:: Skilled nursing facility Living Arrangements: Children Available Help at Discharge: Family;Available 24 hours/day Type of Home: House Home Access: Ramped entrance     Home Layout: One level Home Equipment: Bedside commode;Hospital bed;Wheelchair - power;Grab bars - toilet;Grab bars - tub/shower Additional Comments: Pt has old hoyer lift, but not functional as much anymore and difficult to maneuver.    Prior Function Level of Independence: Needs assistance   Gait / Transfers Assistance Needed: transfers with hoyer lift only; rolls in bed with assist  ADL's / Homemaking Assistance Needed: Pt requires set-upA for feeding and brushing teeth; otherwise near dependence  due to R sided weakness from CVA        Hand Dominance   Dominant Hand: Left    Extremity/Trunk Assessment   Upper Extremity Assessment Upper Extremity Assessment: Defer to OT evaluation RUE Deficits / Details: RUE flaccidity, PROM FF to 90* before pain; LUE Deficits / Details: weakness 2-/5 MM grade AROM,  WFLs to 90*  FF    Lower Extremity Assessment Lower Extremity Assessment: Generalized weakness;RLE deficits/detail;LLE deficits/detail RLE Coordination: decreased gross motor LLE Coordination: decreased gross motor    Cervical / Trunk Assessment Cervical / Trunk Assessment: Other exceptions Cervical / Trunk Exceptions: weakness  Communication   Communication: Expressive difficulties  Cognition Arousal/Alertness: Awake/alert Behavior During Therapy: WFL for tasks assessed/performed;Flat affect Overall Cognitive Status: History of cognitive impairments - at baseline                                 General Comments: aphasic at baseline; unable to always comprehend nor speak      General Comments General comments (skin integrity, edema, etc.): Daughter reports she is weaker than usual. She is open to her going to rehab at discharge for potential improvement in strength. She would like to bring her home if there is no potenial to improve.    Exercises     Assessment/Plan    PT Assessment Patient needs continued PT services  PT Problem List Decreased strength;Decreased mobility;Decreased activity tolerance;Decreased balance;Pain;Decreased skin integrity;Cardiopulmonary status limiting activity;Impaired tone;Obesity;Decreased range of motion       PT Treatment Interventions Therapeutic activities;Therapeutic exercise;Functional mobility training;Patient/family education    PT Goals (Current goals can be found in the Care Plan section)  Acute Rehab PT Goals Patient Stated Goal: to go home or go to SNF PT Goal Formulation: With family Time For Goal Achievement: 12/24/19 Potential to Achieve Goals: Good    Frequency Min 2X/week   Barriers to discharge        Co-evaluation               AM-PAC PT "6 Clicks" Mobility  Outcome Measure Help needed turning from your back to your side while in a flat bed without using bedrails?: Total Help needed moving from  lying on your back to sitting on the side of a flat bed without using bedrails?: Total Help needed moving to and from a bed to a chair (including a wheelchair)?: Total Help needed standing up from a chair using your arms (e.g., wheelchair or bedside chair)?: Total Help needed to walk in hospital room?: Total Help needed climbing 3-5 steps with a railing? : Total 6 Click Score: 6    End of Session   Activity Tolerance: Patient limited by lethargy;Patient limited by pain Patient left: in bed;with call bell/phone within reach;with family/visitor present Nurse Communication: Mobility status PT Visit Diagnosis: Muscle weakness (generalized) (M62.81);Other abnormalities of gait and mobility (R26.89);Hemiplegia and hemiparesis Hemiplegia - Right/Left: Right Hemiplegia - caused by: Cerebral infarction    Time: 3976-7341 PT Time Calculation (min) (ACUTE ONLY): 23 min   Charges:   PT Evaluation $PT Eval Moderate Complexity: 1 Mod          Kristyn Conetta, PT, GCS 12/10/19,3:38 PM

## 2019-12-10 NOTE — Progress Notes (Signed)
ANTICOAGULATION CONSULT NOTE - Follow Up Consult  Pharmacy Consult for Warfarin  Indication: History of PE/DVT, stroke  Allergies  Allergen Reactions  . Valtrex [Valacyclovir Hcl] Cough    Cough and gagging     Vital Signs: Temp: 97.6 F (36.4 C) (12/05 0408) Temp Source: Oral (12/05 0408) BP: 125/66 (12/05 0408) Pulse Rate: 99 (12/05 0408)  Labs: Recent Labs    12/07/19 1608 12/08/19 0601 12/08/19 9924 12/08/19 0658 12/08/19 0950 12/09/19 0335 12/09/19 1942 12/10/19 0302  HGB   < >  --  12.8   < >  --  12.7  --  12.4  HCT   < >  --  41.2  --   --  42.3  --  41.5  PLT   < >  --  1,026*  --   --  1,009*  --  1,031*  LABPROT  --  36.7*  --   --   --  40.8*  --  38.1*  INR  --  3.9*  --   --   --  4.4*  --  4.0*  CREATININE   < >  --  <0.30*  --   --   --  0.34* 0.36*  TROPONINIHS  --   --  8  --  5  --   --   --    < > = values in this interval not displayed.    Estimated Creatinine Clearance: 44.6 mL/min (A) (by C-G formula based on SCr of 0.36 mg/dL (L)).   Medical History: Past Medical History:  Diagnosis Date  . ARTHRITIS   . Arthritis   . Diverticulosis   . Essential thrombocytosis ([place])   . HYPERTENSION   . HYPERTHYROIDISM   . Hyperthyroidism    s/p I-131 ablation 03/2011 of multinod goiter  . INSOMNIA   . MIGRAINE HEADACHE   . OBESITY   . Posttraumatic stress disorder   . Pulmonary embolism ([place]) 03/2014   with DVT  . Stroke [facility]) 03/2014   dysarthria    Assessment: 86 y/o F presented on 12/07/2019 with R sided weakness found to have concern for UTI and possible aspiration. Patient is on warfarin prior to admission for history of PE/DVT/stroke. Pharmacy consulted for warfarin dosing. INR supratherapeutic at 3.4 on admission.  INR of 4.0 is supratherapeutic but now downtrending after holding warfarin. Per RN no reported bleeding. Hgb stable. Plt 1031 - patient with essential thrombocytosis. Azithromycin changed to doxycycline on 12/4 due to drug drug  interaction with warfarin and increased risk of bleeding.   PTA Warfarin: per 11/17 visit supposed to be on 2g TTS, 4mg  AOD, daughter reported 2mg  Sun/Thr, 4mg  AOD to pharmacy technician during med rec.  Goal of Therapy:  INR 2-3 Monitor platelets by anticoagulation protocol: Yes   Plan:  Continue to hold warfarin today Monitor daily INR, CBC/plt Monitor for signs/symptoms of bleeding    Cristela Felt, PharmD Clinical Pharmacist

## 2019-12-10 NOTE — Evaluation (Signed)
Occupational Therapy Evaluation Patient Details Name: Sandy Barrett MRN: [id number] DOB: 04/06/1932 Today's Date: 12/10/2019    History of Present Illness Sandy Barrett is a 86 y.o. female with history of stroke with right-sided hemiplegia, history of pulmonary embolism, essential thrombocytosis. Pt  was having weakness on the right upper and lower extremity.  Patient has previous stroke and is hemiplegic on the right side but as per the family the right side is usually spastic but became more flaccid. MRA/MRI brain negative for acute CVA. UTI noted.   Clinical Impression   Pt PTA; Pt living with daughter at home with means of transportation of hoyer lift. Pt was affected by precious CVA on R side/UB/LB. LUE used for feeding and light grooming. Pt currently, limited by decreased strength, decreased ability to care for self and decreased mobility. Pt with R sided weakness/flaccidity at this time. L side weakness noted 2/5 MM grade and FF to 90*. Pt's daughter in room to assist as needed, but pt needs to be stronger to go home to be able to participate more with rolling. Pt near [place] +2 for bed mobility to EOB, minA at EOB for stability; minA to totalA for ADL tasks. Pt would benefit from continued OT skilled services. OT following acutely.    Follow Up Recommendations  SNF;Supervision/Assistance - 24 hour    Equipment Recommendations  Other (comment) (rolling shower chair)    Recommendations for Other Services       Precautions / Restrictions Precautions Precautions: Fall;Other (comment) Precaution Comments: R side flaccid from previous CVA Restrictions Weight Bearing Restrictions: No      Mobility Bed Mobility Overal bed mobility: Needs Assistance Bed Mobility: Rolling;Sidelying to Sit;Sit to Supine Rolling: Mod assist;+2 for physical assistance Sidelying to sit: Max assist;+2 for physical assistance;+2 for safety/equipment;HOB elevated   Sit to supine: Max assist;+2 for  physical assistance;+2 for safety/equipment   General bed mobility comments: Pt can initiate minimal rolling to R side with L hand, but pt mostly maxA +2 overall for bed mobility. Can sit EOB with minA overall to avoid leaning on R side.    Transfers                 General transfer comment: DNT will need maximove    Balance Overall balance assessment: Needs assistance Sitting-balance support: Bilateral upper extremity supported;Feet supported Sitting balance-Leahy Scale: Fair Sitting balance - Comments: minA for stability                                   ADL either performed or assessed with clinical judgement   ADL Overall ADL's : Needs assistance/impaired Eating/Feeding: Moderate assistance   Grooming: Maximal assistance;Bed level   Upper Body Bathing: Maximal assistance;Bed level   Lower Body Bathing: Total assistance;+2 for physical assistance;+2 for safety/equipment   Upper Body Dressing : Maximal assistance;Bed level   Lower Body Dressing: Total assistance;+2 for physical assistance;+2 for safety/equipment   Toilet Transfer: Total assistance;+2 for physical assistance;+2 for safety/equipment Toilet Transfer Details (indicate cue type and reason): will require maximove Toileting- Clothing Manipulation and Hygiene: Total assistance       Functional mobility during ADLs: Maximal assistance;+2 for safety/equipment;+2 for physical assistance General ADL Comments: Pt limited by decreased strength, decreased ability to care for self and decreased mobility. Pt with R sided weakness/flaccidity at this time. L side weakness noted 2/5 MM grade and FF to 90*. Pt's daughter in room to  assist as needed, but pt needs to be stronger to go home to be able to participate more with rolling.     Vision Baseline Vision/History: No visual deficits Patient Visual Report: No change from baseline Vision Assessment?: No apparent visual deficits     Perception      Praxis      Pertinent Vitals/Pain Pain Assessment: Faces Faces Pain Scale: Hurts a little bit Pain Location: all over with movement Pain Descriptors / Indicators: Grimacing;Discomfort Pain Intervention(s): Monitored during session     Hand Dominance Left   Extremity/Trunk Assessment Upper Extremity Assessment Upper Extremity Assessment: Generalized weakness;RUE deficits/detail;LUE deficits/detail RUE Deficits / Details: RUE flaccidity, PROM FF to 90* before pain; LUE Deficits / Details: weakness 2-/5 MM grade AROM, WFLs to 90*  FF   Lower Extremity Assessment Lower Extremity Assessment: Generalized weakness   Cervical / Trunk Assessment Cervical / Trunk Assessment: Other exceptions Cervical / Trunk Exceptions: weakness   Communication Communication Communication: Expressive difficulties (aphasia; pt uninteligble at times)   Cognition Arousal/Alertness: Awake/alert Behavior During Therapy: WFL for tasks assessed/performed;Flat affect Overall Cognitive Status: History of cognitive impairments - at baseline                                 General Comments: aphasic at baseline; unable to always comprehend nor speak   General Comments  Pt's daughter in room reveals that pt is much weaker on R side than usual. Pt stating that if she cannot return to PLOF, pt will need to go to SNF. SNF is the safest options due to sores on bottom    Exercises     Shoulder Instructions      Home Living Family/patient expects to be discharged to:: Private residence Living Arrangements: Children (daughter) Available Help at Discharge: Family;Available 24 hours/day Type of Home: House Home Access: Ramped entrance     Home Layout: One level     Bathroom Shower/Tub: Occupational psychologist: Handicapped height Bathroom Accessibility: Yes How Accessible: Accessible via wheelchair;Accessible via walker Home Equipment: Bedside commode;Hospital bed;Wheelchair -  power;Grab bars - toilet;Grab bars - tub/shower   Additional Comments: Pt has old hoyer lift, but not functional as much anymore and difficult to maneuver.      Prior Functioning/Environment Level of Independence: Needs assistance  Gait / Transfers Assistance Needed: transfers with hoyer lift only; rolls is bed with assist ADL's / Homemaking Assistance Needed: Pt requires set-upA for feeding and brushing teeth; othaerwise near dependence due to R sided weakness from CVA            OT Problem List: Decreased strength;Decreased activity tolerance;Impaired balance (sitting and/or standing);Decreased coordination;Decreased cognition;Decreased safety awareness;Decreased knowledge of use of DME or AE;Cardiopulmonary status limiting activity;Pain;Increased edema;Impaired UE functional use      OT Treatment/Interventions: Self-care/ADL training;Therapeutic exercise;Energy conservation;Neuromuscular education;DME and/or AE instruction;Therapeutic activities;Cognitive remediation/compensation;Patient/family education;Balance training    OT Goals(Current goals can be found in the care plan section) Acute Rehab OT Goals Patient Stated Goal: to go home or go to SNF OT Goal Formulation: With patient/family Time For Goal Achievement: 12/24/19 Potential to Achieve Goals: Good ADL Goals Pt Will Perform Grooming: with supervision Pt Will Perform Upper Body Dressing: with min assist;sitting Pt/caregiver will Perform Home Exercise Program: Increased strength;Both right and left upper extremity;With minimal assist Additional ADL Goal #1: Pt will tolerate EOB or unsupported sitting in recliner for x5 mins of ADL tasks to increase  independence to PLOF.  OT Frequency: Min 2X/week   Barriers to D/C:            Co-evaluation              AM-PAC OT "6 Clicks" Daily Activity     Outcome Measure Help from another person eating meals?: A Little Help from another person taking care of personal  grooming?: A Lot Help from another person toileting, which includes using toliet, bedpan, or urinal?: Total Help from another person bathing (including washing, rinsing, drying)?: A Lot Help from another person to put on and taking off regular upper body clothing?: A Lot Help from another person to put on and taking off regular lower body clothing?: Total 6 Click Score: 11   End of Session Equipment Utilized During Treatment: Oxygen Nurse Communication: Mobility status  Activity Tolerance: Patient limited by pain;Treatment limited secondary to medical complications (Comment) Patient left: in bed;with call bell/phone within reach;with family/visitor present  OT Visit Diagnosis: Muscle weakness (generalized) (M62.81);Pain;Other symptoms and signs involving cognitive function Pain - part of body:  (generalized)                Time: 5374-8270 OT Time Calculation (min): 20 min Charges:  OT General Charges $OT Visit: 1 Visit OT Evaluation $OT Eval Moderate Complexity: 1 Mod  Jefferey Pica, OTR/L Acute Rehabilitation Services Pager: [phone] Office: [phone]   Decarlos Empey C 12/10/2019, 3:03 PM

## 2019-12-10 NOTE — Progress Notes (Signed)
PROGRESS NOTE  Sandy Barrett DXI:[id number] DOB: 10/21/1932 DOA: 12/07/2019 PCP: [name], MD  Brief History    Sandy Barrett is a 86 y.o. female with history of stroke with right-sided hemiplegia, history of pulmonary embolism on Coumadin, essential thrombocytosis on hydroxyurea was brought to the ER after patient's family found that patient was having weakness on the right upper and lower extremity.  Patient has previous stroke and is hemiplegic on the right side but as per the family the right side is usually spastic but became more flaccid.  In addition patient also was found to be having some cough.  Given the symptoms patient was brought to the ER.  ED Course: In the ER patient was afebrile labs are significant for sodium of 148 WBC of 20.9 platelets of thousand.  BNP was 1700 lactic acid was normal.  CT head and CT chest was unremarkable.  MRI brain and MRA neck was done which were unremarkable.  Patient's UA was concerning for UTI was started on empiric antibiotics.  Patient also was empirically start on antibiotics for possible aspiration.  Patient started on gentle hydration admitted for further management of weakness cough.  Covid test was negative.  EKG shows normal sinus rhythm low voltage.  Nonspecific ST changes.  The patient has been admitted to a telemetry bed. She is receiving IV antibiotics and IV fluids.   Wound care nurse has evaluated the patient and documented that the patient has 3 stage 3 wounds on her buttocks with tunneling and undermining and 2 stage 2's on her buttocks. She has packed the stage 3 sites with NS wet iodoform and stage 2 were covered with Mepilex.  Need to discuss elevated WBC with the patient's oncologist, Dr. Alvy Bimler in am.  Consultants  . Wound care.  Procedures  . None  Antibiotics   Anti-infectives (From admission, onward)   Start     Dose/Rate Route Frequency Ordered Stop   12/09/19 1800  doxycycline (VIBRA-TABS) tablet 100 mg         100 mg Oral Every 12 hours 12/09/19 1336     12/08/19 1800  cefTRIAXone (ROCEPHIN) 2 g in sodium chloride 0.9 % 100 mL IVPB        2 g 200 mL/hr over 30 Minutes Intravenous Every 24 hours 12/08/19 0428     12/08/19 1800  azithromycin (ZITHROMAX) 500 mg in sodium chloride 0.9 % 250 mL IVPB  Status:  Discontinued        500 mg 250 mL/hr over 60 Minutes Intravenous Every 24 hours 12/08/19 0428 12/09/19 1336   12/07/19 2015  cefTRIAXone (ROCEPHIN) 1 g in sodium chloride 0.9 % 100 mL IVPB        1 g 200 mL/hr over 30 Minutes Intravenous  Once 12/07/19 2012 12/07/19 2119   12/07/19 2015  azithromycin (ZITHROMAX) 500 mg in sodium chloride 0.9 % 250 mL IVPB        500 mg 250 mL/hr over 60 Minutes Intravenous  Once 12/07/19 2012 12/07/19 2232      Subjective  The patient is resting quietly in bed. Family at bedside.  Objective   Vitals:  Vitals:   12/10/19 0408 12/10/19 0952  BP: 125/66 119/72  Pulse: 99 90  Resp: 17 18  Temp: 97.6 F (36.4 C) 97.7 F (36.5 C)  SpO2: 99% 100%   Exam:  Constitutional:  . The patient is awake and alert. No acute distress. Respiratory:  . No increased work of breathing. . No wheezes,  rales, or rhonchi . No tactile fremitus Cardiovascular:  . Regular rate and rhythm . No murmurs, ectopy, or gallups. . No lateral PMI. No thrills. Abdomen:  . Abdomen is soft, non-tender, non-distended . No hernias, masses, or organomegaly . Normoactive bowel sounds.  Musculoskeletal:  . No cyanosis, clubbing, or edema Skin:  . No rashes, lesions, ulcers . palpation of skin: no induration or nodules . Stage 2 and 3 wounds to buttocks Neurologic:  . CN 2-12 intact . Sensation all 4 extremities intact Psychiatric:  . Mental status o Mood, affect appropriate o Orientation to person, place, time  . judgment and insight appear intact  I have personally reviewed the following:   Today's Data  . Vitals, CBC, CMP  Lab Data  . UA positive for  UTI.  Micro Data  . Blood cultures x 2: No growth . Urine culture: pending.  Imaging  . CT chest . CT head . CXR  Scheduled Meds: . acidophilus  2 capsule Oral Daily  . vitamin C  500 mg Oral Daily  . azelastine  2 spray Each Nare BID  . doxycycline  100 mg Oral Q12H  . hydroxyurea  1,000 mg Oral Once per day on Sun Wed Sat  . hydroxyurea  500 mg Oral Once per day on Mon Tue Thu Fri  . omega-3 acid ethyl esters  1 capsule Oral Daily  . pantoprazole  40 mg Oral BID  . pregabalin  50 mg Oral TID  . propranolol  5 mg Oral BID  . Ensure Max Protein  11 oz Oral Daily  . Warfarin - Pharmacist Dosing Inpatient   Does not apply q1600  . zinc sulfate  220 mg Oral Daily   Continuous Infusions: . sodium chloride 75 mL/hr at 12/10/19 1822  . cefTRIAXone (ROCEPHIN)  IV 2 g (12/10/19 1750)    Principal Problem:   Weakness Active Problems:   History of pulmonary embolism   Cerebral infarction due to embolism of left middle cerebral artery (HCC)   Essential thrombocytosis (HCC)   Pressure injury of skin   Acute lower UTI   Hypernatremia   Severe sepsis (HCC)   LOS: 2 days   A & P   Generalized weakness more on the right side with previous stroke MRI brain brain and neck is unremarkable.  Suspect patient's weakness could have been exacerbated by dehydration and UTI.  On empiric antibiotics follow cultures on IV fluids.  Leukocytosis/Thrombocytosis: 28K down to 21K today. Out of scale for what would be expected for UTI and/or simple infection. Pt has known hematological issue for which she sees Dr. Alvy Bimler. This is primarily her throbocytosis. The most recent office note from Dr. Alvy Bimler is from April of 2021. It does not elevated WBC, but states that it is though to be reactive and secondary to the patient's ducubitus ulcers. All appointments following that have been cancelled. Pt's hydrea was continued at her current dosage out of concern for pancytopenia. Recommend discussing  leukocytosis with Dr. Alvy Bimler in the am.  Cough concerning for aspiration we will get speech therapy evaluation for swallow.  On empiric antibiotics for now. Very little evidence for pneumonia on imagery. Blood cultures with no growth.  Stage 2 and 3 wounds to buttocks: (3) stage 3 with tunneling and undermining. 2 stage 2. Wound care has packed the stage 3 sites with NS wet iodoform and stage 2 were covered with Mepilex. Per wound care they are clean and without active discharge. No necrosis or  foul smelling drainage.   Hypernatremia and dehydration for which patient is on 1/2 NS at 75 cc/hr at this time. Monitor volume status and electrolytes.  Elevated BNP levels but patient clinically looks dehydrated.  Since patient has low voltage EKG will check 2D echo.  UTI on empiric antibiotics. Urine culture pending.  History of essential thrombocytosis on hydroxyurea.  Platelet count has increased from baseline.   History of PE on Coumadin.  If patient fails swallow then will need IV heparin.  Family was not keen on starting Lovenox.  INR is supertherapeutic at this time. Monitor.  History of previous stroke with right-sided hemiplegia presently on Coumadin.  Moderate protein calorie malnutrtion: Nutrition consulted.  I have seen and examined this patient myself. I have spent 32 minutes in her evaluation and care. Family at bedisde. All questions answered to the best of my ability.  DVT prophylaxis: Coumadin. Code Status: DO NOT INTUBATE as confirmed with patient's daughter. Family Communication: Patient's daughter and sister at bedside. Disposition Plan: Home.  Stable. Sandy Puopolo, DO [facility] Direct contact: see [email or web address]  7PM-7AM contact night coverage as above 12/10/2019, 6:38 PM  LOS: 0 days

## 2019-12-11 LAB — CBC
HCT: 38.7 % (ref 36.0–46.0)
Hemoglobin: 12.3 g/dL (ref 12.0–15.0)
MCH: 32.1 pg (ref 26.0–34.0)
MCHC: 31.8 g/dL (ref 30.0–36.0)
MCV: 101 fL — ABNORMAL HIGH (ref 80.0–100.0)
Platelets: 847 10*3/uL — ABNORMAL HIGH (ref 150–400)
RBC: 3.83 MIL/uL — ABNORMAL LOW (ref 3.87–5.11)
RDW: 18.9 % — ABNORMAL HIGH (ref 11.5–15.5)
WBC: 19.8 10*3/uL — ABNORMAL HIGH (ref 4.0–10.5)
nRBC: 0 % (ref 0.0–0.2)

## 2019-12-11 LAB — PROTIME-INR
INR: 2.6 — ABNORMAL HIGH (ref 0.8–1.2)
Prothrombin Time: 27 seconds — ABNORMAL HIGH (ref 11.4–15.2)

## 2019-12-11 MED ORDER — ENSURE MAX PROTEIN PO LIQD
11.0000 [oz_av] | Freq: Every day | ORAL | Status: DC
  Administered 2019-12-12: 11 [oz_av] via ORAL
  Filled 2019-12-11 (×2): qty 330

## 2019-12-11 MED ORDER — ADULT MULTIVITAMIN W/MINERALS CH
1.0000 | ORAL_TABLET | Freq: Every day | ORAL | Status: DC
  Administered 2019-12-11 – 2019-12-13 (×3): 1 via ORAL
  Filled 2019-12-11 (×3): qty 1

## 2019-12-11 MED ORDER — PROSOURCE PLUS PO LIQD
30.0000 mL | Freq: Two times a day (BID) | ORAL | Status: DC
  Administered 2019-12-11 – 2019-12-13 (×5): 30 mL via ORAL
  Filled 2019-12-11 (×5): qty 30

## 2019-12-11 MED ORDER — WARFARIN SODIUM 2 MG PO TABS
2.0000 mg | ORAL_TABLET | Freq: Once | ORAL | Status: AC
  Administered 2019-12-11: 2 mg via ORAL
  Filled 2019-12-11: qty 1

## 2019-12-11 NOTE — Progress Notes (Addendum)
Nutrition Follow-up  DOCUMENTATION CODES:   Obesity unspecified  INTERVENTION:   -D/c calorie count -MVI with minerals daily -Continue Ensure Max po daily, each supplement provides 150 kcal and 30 grams of protein -30 ml Prosource Plus BID, each supplement provides 100 kcals and 15 grams protein -Magic cup TID with meals, each supplement provides 290 kcal and 9 grams of protein -Feeding assistance with meals  NUTRITION DIAGNOSIS:   Increased nutrient needs related to wound healing as evidenced by estimated needs.  GOAL:   Patient will meet greater than or equal to 90% of their needs  MONITOR:   PO intake, Supplement acceptance, Labs, Weight trends, Skin, I & O's  REASON FOR ASSESSMENT:   Consult Calorie Count  ASSESSMENT:   Sandy Barrett is a 86 y.o. female with history of stroke with right-sided hemiplegia, history of pulmonary embolism on Coumadin, essential thrombocytosis on hydroxyurea was brought to the ER after patient's family found that patient was having weakness on the right upper and lower extremity.  Patient has previous stroke and is hemiplegic on the right side but as per the family the right side is usually spastic but became more flaccid.  In addition patient also was found to be having some cough  Pt admitted with generalized weakness on rt side.   12/3- s/p BSE- advanced to regular diet with thin liquids  Reviewed I/O's: +1.5 L x 24 hours and +1.7 L since admission  UOP: 1.7 L x 24 hours  Per MD notes, MRI of brain and neck unremarkable; suspect weakness secondary to UTI and dehydration.   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   Pt receiving personal care at time of visit. RD unable to obtain further nutrition-related history or complete nutrition-focused physical exam at this time.   Therapies recommending SNF placement at discharge.   Noted  calorie count was ordered on 12/09/19. Documented meal completions 10-50%. Review of meal tickets is as follows:  12/09/19 Breakfast: 67 kcals, 0 grams protein Lunch: 771 kcals, 9 grams protein Dinner: 245 kcals, 3 grams protein Supplements: 38 kcals, 8 grams protein  Total intake: 1121 kcal (64% of minimum estimated needs)  20 grams protein (22% of minimum estimated needs)  12/10/19 Breakfast: 464 kcals, 27 grams protein Lunch: 182 kcals, 14 grams protein Dinner: 341 kcals, 9 grams protein Supplements: 150 kcals, 30 grams protein  Total intake: 1137 kcal (65% of minimum estimated needs)  80 grams protein (89% of minimum estimated needs)  AverageTotal intake: 1129 kcal (65% of minimum estimated needs)  50 grams protein (56% of minimum estimated needs)  Medications reviewed and include vitamin C, zinc sulfate and 0.45% sodium chloride infusion @ 75 ml/hr.   Labs reviewed.  Diet Order:   Diet Order            Diet Heart Room service appropriate? Yes; Fluid consistency: Thin  Diet effective now                 EDUCATION NEEDS:   No education needs have been identified at this time  Skin:  Skin Assessment: Skin Integrity Issues: Skin Integrity Issues:: Stage III, Stage IV, Incisions Stage III: sacrum Stage IV: sacrum Incisions: full thickness rt groin  Last BM:  12/09/19  Height:   Ht Readings from Last 1 Encounters:  12/08/19 4\' 11"  (1.499 m)    Weight:   Wt Readings from  Last 1 Encounters:  12/08/19 72.6 kg    Ideal Body Weight:  44.5 kg  BMI:  Body mass index is 32.32 kg/m.  Estimated Nutritional Needs:   Kcal:  9090-3014  Protein:  90-105 grams  Fluid:  > 1.7 L    Loistine Chance, RD, LDN, [place] Registered Dietitian II Certified Diabetes Care and Education Specialist Please refer to [facility] for RD and/or RD on-call/weekend/after hours pager

## 2019-12-11 NOTE — Progress Notes (Signed)
ANTICOAGULATION CONSULT NOTE - Follow Up Consult  Pharmacy Consult for Warfarin  Indication: History of PE/DVT, stroke  Allergies  Allergen Reactions  . Valtrex [Valacyclovir Hcl] Cough    Cough and gagging     Vital Signs: Temp: 97.8 F (36.6 C) (12/06 0409) Temp Source: Oral (12/06 0409) BP: 122/78 (12/06 0409) Pulse Rate: 94 (12/06 0409)  Labs: Recent Labs    12/08/19 0950 12/09/19 0335 12/09/19 0335 12/09/19 1942 12/10/19 0302 12/11/19 0334  HGB  --  12.7   < >  --  12.4 12.3  HCT  --  42.3  --   --  41.5 38.7  PLT  --  1,009*  --   --  1,031* 847*  LABPROT  --  40.8*  --   --  38.1* 27.0*  INR  --  4.4*  --   --  4.0* 2.6*  CREATININE  --   --   --  0.34* 0.36*  --   TROPONINIHS 5  --   --   --   --   --    < > = values in this interval not displayed.    Estimated Creatinine Clearance: 44.6 mL/min (A) (by C-G formula based on SCr of 0.36 mg/dL (L)).   Medical History: Past Medical History:  Diagnosis Date  . ARTHRITIS   . Arthritis   . Diverticulosis   . Essential thrombocytosis ([place])   . HYPERTENSION   . HYPERTHYROIDISM   . Hyperthyroidism    s/p I-131 ablation 03/2011 of multinod goiter  . INSOMNIA   . MIGRAINE HEADACHE   . OBESITY   . Posttraumatic stress disorder   . Pulmonary embolism ([place]) 03/2014   with DVT  . Stroke [facility]) 03/2014   dysarthria    Assessment: 86 y/o F presented on 12/07/2019 with R sided weakness found to have concern for UTI/PNA and possible aspiration. Patient is on warfarin prior to admission for history of PE/DVT/stroke. Pharmacy consulted for warfarin dosing. INR supratherapeutic up to 4.4 on admit.  INR down to 2.6 after holding warfarin x4 days. Hgb stable. Plt 1031> 847 with essential thrombocytosis. Azithromycin changed to doxycycline on 12/4 due to drug drug interaction with warfarin and increased risk of bleeding. Eating 10-50%.    PTA Warfarin: per 11/17 visit supposed to be on 2g TTS, 4mg  AOD, daughter reported  2mg  Sun/Thr, 4mg  AOD to pharmacy technician during med rec.  Goal of Therapy:  INR 2-3 Monitor platelets by anticoagulation protocol: Yes   Plan:  Warfarin 2mg  x1 today  Monitor daily INR, CBC/plt Monitor for signs/symptoms of bleeding   Benetta Spar, PharmD, BCPS, BCCP Clinical Pharmacist  Please check AMION for all [place] phone numbers After 10:00 PM, call [place] [phone]

## 2019-12-11 NOTE — Progress Notes (Signed)
PROGRESS NOTE    Riti Rollyson  ALP:[id number] DOB: 1932-08-06 DOA: 12/07/2019 PCP: [name], MD    Brief Narrative:  85 year old female with history of stroke and right-sided hemiplegia, history of PE on Coumadin, essential thrombocytosis on hydroxyurea was brought to ER by patient's daughter when she was found with flaccid paralysis of the right upper and lower extremity.  Family reported that she usually has spasticity of the extremities, this time she was very flaccid so they brought her to ER.  They also noticed that she had intermittent cough.  In the emergency room her WBC count was 20.9.  Platelets were more than million.  CT head and CT chest was unremarkable.  MRI brain and MRA of the neck were without any evidence of new stroke.  Urinalysis was concerning for UTI, unfortunately culture was not sent.  Patient was treated with Rocephin for empiric coverage for UTI and also possible aspiration.  She also has decubitus ulcers.   Assessment & Plan:   Principal Problem:   Weakness Active Problems:   History of pulmonary embolism   Cerebral infarction due to embolism of left middle cerebral artery (HCC)   Essential thrombocytosis (HCC)   Pressure injury of skin   Acute lower UTI   Hypernatremia   Severe sepsis (HCC)  Generalized weakness in a patient with chronic debility and bedbound status, previous right hemiplegia: Her MRI and MRA of the brain unremarkable, no evidence of new neurological insult. Probably progressive debility due to presence of dehydration, poor oral intake and UTI. Will suggest symptomatic management IV fluid, encourage nutrition.  Leukocytosis and thrombocytosis: Known thrombocytosis on hydroxyurea.  She does have history of leukocytosis, outpatient records shows WC count more than 14,000 at times.  Continued hydroxyurea.  Will suggest outpatient follow-up with her hematologist after discharge.  Suspected aspiration: Seen by speech therapy.  She has  no evidence of pneumonia on her CT scan test.  Blood cultures negative.  All-time aspiration precautions.  Stage II and III pressure ulcers on the buttock: Seen by wound care.  Local wound therapy.  Less likely source of infection.  Moderate dehydration with hypernatremia: On half-normal saline with improvement.  Continue to monitor.  Presumed UTI: On empiric Rocephin.  Unfortunately no urine culture was collected.  Will treat with 5 days of antibiotic therapy.  History of pulmonary embolism on Coumadin: Therapeutic.  Chronic right hemiplegia: Stable.   DVT prophylaxis:  warfarin (COUMADIN) tablet 2 mg   Code Status: Partial Family Communication: Daughter at bedside Disposition Plan: Status is: Inpatient  Remains inpatient appropriate because:Inpatient level of care appropriate due to severity of illness   Dispo: The patient is from: Home              Anticipated d/c is to: Home              Anticipated d/c date is: 2 days              Patient currently is not medically stable to d/c.         Consultants:   None  Procedures:   None  Antimicrobials:   Rocephin, 12/2---   Subjective: Patient seen and examined.  Her daughter was at the bedside.  Patient was initially sleepy.  She woke up and was able to keep up conversation however with unintelligible speech. Daughter reported that she looks lethargic than usual otherwise her mentation and speech is about baseline. Dietitian reported to poor oral intake.  Objective: Vitals:  12/10/19 0952 12/10/19 2129 12/11/19 0409 12/11/19 1423  BP: 119/72 136/74 122/78 118/76  Pulse: 90 (!) 108 94 94  Resp: 18 18 17 18   Temp: 97.7 F (36.5 C) 98.3 F (36.8 C) 97.8 F (36.6 C) 98 F (36.7 C)  TempSrc:  Oral Oral Oral  SpO2: 100% 100% 99% 100%  Weight:      Height:        Intake/Output Summary (Last 24 hours) at 12/11/2019 1518 Last data filed at 12/11/2019 1300 Gross per 24 hour  Intake 3515.59 ml  Output 2050 ml   Net 1465.59 ml   Filed Weights   12/08/19 1431  Weight: 72.6 kg    Examination:  General exam: Appears calm and comfortable  Sick looking lady on 2 L oxygen that she uses at home. Mostly sleepy.  Wakes up for conversation.  She has unintelligible speech but daughter reported that she is able to communicate like this.  She was able to follow commands. She has a palpable goiter on her neck. Respiratory system: Clear to auscultation. Respiratory effort normal. Cardiovascular system: S1 & S2 heard, RRR. No JVD, murmurs, rubs, gallops or clicks. No pedal edema. Gastrointestinal system: Abdomen is nondistended, soft and nontender. No organomegaly or masses felt. Normal bowel sounds heard. Central nervous system: Alert and oriented.  Flaccid right hemiplegia.     Data Reviewed: I have personally reviewed following labs and imaging studies  CBC: Recent Labs  Lab 12/06/19 1210 12/06/19 1210 12/07/19 1608 12/07/19 1608 12/07/19 2324 12/08/19 0658 12/09/19 0335 12/10/19 0302 12/11/19 0334  WBC 22.6 Repeated and verified X2.*   < > 20.9*  --   --  28.3* 28.2* 21.3* 19.8*  NEUTROABS 20.3*  --  17.9*  --   --  26.6*  --  18.6*  --   HGB 13.5   < > 13.1   < > 14.3 12.8 12.7 12.4 12.3  HCT 42.9   < > 42.6   < > 42.0 41.2 42.3 41.5 38.7  MCV 101.2*   < > 103.4*  --   --  101.5* 102.7* 104.0* 101.0*  PLT 868.0*   < > 1,031*  --   --  1,026* 1,009* 1,031* 847*   < > = values in this interval not displayed.   Basic Metabolic Panel: Recent Labs  Lab 12/06/19 1210 12/06/19 1210 12/07/19 1608 12/07/19 2324 12/08/19 0658 12/09/19 1942 12/10/19 0302  NA 147*   < > 148* 149* 147* 140 140  K 3.5   < > 3.8 3.7 3.5 3.2* 4.7  CL 114*  --  114*  --  112* 106 105  CO2 25  --  23  --  21* 19* 24  GLUCOSE 91  --  87  --  119* 120* 91  BUN 22  --  16  --  16 11 11   CREATININE 0.42  --  0.31*  --  <0.30* 0.34* 0.36*  CALCIUM 9.5  --  9.2  --  9.0 8.5* 8.6*   < > = values in this interval  not displayed.   GFR: Estimated Creatinine Clearance: 44.6 mL/min (A) (by C-G formula based on SCr of 0.36 mg/dL (L)). Liver Function Tests: Recent Labs  Lab 12/06/19 1210 12/07/19 1608 12/08/19 0658  AST 20 13* 15  ALT 12 9 8   ALKPHOS 83 75 75  BILITOT 0.5 1.0 0.9  PROT 7.8 7.4 7.1  ALBUMIN 3.3* 2.7* 2.5*   No results for input(s): LIPASE, AMYLASE in  the last 168 hours. No results for input(s): AMMONIA in the last 168 hours. Coagulation Profile: Recent Labs  Lab 12/07/19 2026 12/08/19 0601 12/09/19 0335 12/10/19 0302 12/11/19 0334  INR 3.4* 3.9* 4.4* 4.0* 2.6*   Cardiac Enzymes: No results for input(s): CKTOTAL, CKMB, CKMBINDEX, TROPONINI in the last 168 hours. BNP (last 3 results) No results for input(s): PROBNP in the last 8760 hours. HbA1C: No results for input(s): HGBA1C in the last 72 hours. CBG: No results for input(s): GLUCAP in the last 168 hours. Lipid Profile: No results for input(s): CHOL, HDL, LDLCALC, TRIG, CHOLHDL, LDLDIRECT in the last 72 hours. Thyroid Function Tests: No results for input(s): TSH, T4TOTAL, FREET4, T3FREE, THYROIDAB in the last 72 hours. Anemia Panel: No results for input(s): VITAMINB12, FOLATE, FERRITIN, TIBC, IRON, RETICCTPCT in the last 72 hours. Sepsis Labs: Recent Labs  Lab 12/07/19 2026  LATICACIDVEN 1.2    Recent Results (from the past 240 hour(s))  SARS Coronavirus 2 by RT PCR (hospital order, performed in [facility] hospital lab) Nasopharyngeal Nasopharyngeal Swab     Status: None   Collection Time: 12/07/19  9:00 PM   Specimen: Nasopharyngeal Swab  Result Value Ref Range Status   SARS Coronavirus 2 NEGATIVE NEGATIVE Final    Comment: (NOTE) SARS-CoV-2 target nucleic acids are NOT DETECTED.  The SARS-CoV-2 RNA is generally detectable in upper and lower respiratory specimens during the acute phase of infection. The lowest concentration of SARS-CoV-2 viral copies this assay can detect is 250 copies / mL. A negative  result does not preclude SARS-CoV-2 infection and should not be used as the sole basis for treatment or other patient management decisions.  A negative result may occur with improper specimen collection / handling, submission of specimen other than nasopharyngeal swab, presence of viral mutation(s) within the areas targeted by this assay, and inadequate number of viral copies (<250 copies / mL). A negative result must be combined with clinical observations, patient history, and epidemiological information.  Fact Sheet for Patients:   StrictlyIdeas.no  Fact Sheet for Healthcare Providers: BankingDealers.co.za  This test is not yet approved or  cleared by the [place] FDA and has been authorized for detection and/or diagnosis of SARS-CoV-2 by FDA under an Emergency Use Authorization (EUA).  This EUA will remain in effect (meaning this test can be used) for the duration of the COVID-19 declaration under Section 564(b)(1) of the Act, 21 U.S.C. section 360bbb-3(b)(1), unless the authorization is terminated or revoked sooner.  Performed at [facility], [place] [address], [place], [place] [address]   Culture, blood (routine x 2)     Status: None (Preliminary result)   Collection Time: 12/07/19  9:02 PM   Specimen: BLOOD  Result Value Ref Range Status   Specimen Description BLOOD LEFT ANTECUBITAL  Final   Special Requests AEROBIC BOTTLE ONLY Blood Culture adequate volume  Final   Culture   Final    NO GROWTH 4 DAYS Performed at [facility], [place] [address], [place], [place] [address]    Report Status PENDING  Incomplete  Culture, blood (routine x 2)     Status: None (Preliminary result)   Collection Time: 12/07/19  9:10 PM   Specimen: BLOOD LEFT HAND  Result Value Ref Range Status   Specimen Description BLOOD LEFT HAND  Final   Special Requests   Final    BOTTLES DRAWN AEROBIC AND ANAEROBIC Blood Culture adequate volume    Culture   Final  NO GROWTH 4 DAYS Performed at [facility], [place] [address], [place], [place] [address]    Report Status PENDING  Incomplete         Radiology Studies: No results found.      Scheduled Meds: . (feeding supplement) PROSource Plus  30 mL Oral BID BM  . acidophilus  2 capsule Oral Daily  . vitamin C  500 mg Oral Daily  . azelastine  2 spray Each Nare BID  . doxycycline  100 mg Oral Q12H  . hydroxyurea  1,000 mg Oral Once per day on Sun Wed Sat  . hydroxyurea  500 mg Oral Once per day on Mon Tue Thu Fri  . multivitamin with minerals  1 tablet Oral Daily  . omega-3 acid ethyl esters  1 capsule Oral Daily  . pantoprazole  40 mg Oral BID  . pregabalin  50 mg Oral TID  . propranolol  5 mg Oral BID  . [START ON 12/12/2019] Ensure Max Protein  11 oz Oral QHS  . warfarin  2 mg Oral ONCE-1600  . Warfarin - Pharmacist Dosing Inpatient   Does not apply q1600  . zinc sulfate  220 mg Oral Daily   Continuous Infusions: . sodium chloride 75 mL/hr at 12/11/19 0641  . cefTRIAXone (ROCEPHIN)  IV Stopped (12/10/19 1855)     LOS: 3 days    Time spent: [place], MD [facility] Pager [phone]

## 2019-12-12 LAB — BASIC METABOLIC PANEL
Anion gap: 9 (ref 5–15)
BUN: 6 mg/dL — ABNORMAL LOW (ref 8–23)
CO2: 24 mmol/L (ref 22–32)
Calcium: 8.5 mg/dL — ABNORMAL LOW (ref 8.9–10.3)
Chloride: 106 mmol/L (ref 98–111)
Creatinine, Ser: <0.3 mg/dL — ABNORMAL LOW (ref 0.44–1.00)
Glucose, Bld: 89 mg/dL (ref 70–99)
Potassium: 4 mmol/L (ref 3.5–5.1)
Sodium: 139 mmol/L (ref 135–145)

## 2019-12-12 LAB — CBC
HCT: 36.9 % (ref 36.0–46.0)
Hemoglobin: 11.3 g/dL — ABNORMAL LOW (ref 12.0–15.0)
MCH: 31.1 pg (ref 26.0–34.0)
MCHC: 30.6 g/dL (ref 30.0–36.0)
MCV: 101.7 fL — ABNORMAL HIGH (ref 80.0–100.0)
Platelets: 1004 10*3/uL (ref 150–400)
RBC: 3.63 MIL/uL — ABNORMAL LOW (ref 3.87–5.11)
RDW: 18.8 % — ABNORMAL HIGH (ref 11.5–15.5)
WBC: 18.7 10*3/uL — ABNORMAL HIGH (ref 4.0–10.5)
nRBC: 0 % (ref 0.0–0.2)

## 2019-12-12 LAB — CULTURE, BLOOD (ROUTINE X 2)
Culture: NO GROWTH
Culture: NO GROWTH
Special Requests: ADEQUATE
Special Requests: ADEQUATE

## 2019-12-12 LAB — PHOSPHORUS: Phosphorus: 1.8 mg/dL — ABNORMAL LOW (ref 2.5–4.6)

## 2019-12-12 LAB — PROTIME-INR
INR: 1.8 — ABNORMAL HIGH (ref 0.8–1.2)
Prothrombin Time: 20.5 seconds — ABNORMAL HIGH (ref 11.4–15.2)

## 2019-12-12 LAB — MAGNESIUM: Magnesium: 1.7 mg/dL (ref 1.7–2.4)

## 2019-12-12 MED ORDER — POTASSIUM & SODIUM PHOSPHATES 280-160-250 MG PO PACK
1.0000 | PACK | Freq: Three times a day (TID) | ORAL | Status: DC
  Administered 2019-12-12 – 2019-12-13 (×4): 1 via ORAL
  Filled 2019-12-12 (×7): qty 1

## 2019-12-12 MED ORDER — WARFARIN SODIUM 3 MG PO TABS
3.0000 mg | ORAL_TABLET | Freq: Once | ORAL | Status: AC
  Administered 2019-12-12: 3 mg via ORAL
  Filled 2019-12-12: qty 1

## 2019-12-12 NOTE — TOC Progression Note (Signed)
Transition of Care [facility]) - Progression Note    Patient Details  Name: Mesha Schamberger MRN: [id number] Date of Birth: 11-16-1932  Transition of Care [facility]) CM/SW Contact  Daquan Crapps, Edson Snowball, RN Phone Number: 12/12/2019, 4:56 PM  Clinical Narrative:     Provided daughter [name] with SNF bed offers . [name] asking if [facility] visits could be increased . Her mother had [facility] three times a week but now [facility] visits one time a week.   NCM called Amy with [facility] and discussed frequency of visits. Amy will discuss this evening with her branch manager and call NCM and [name] back in morning.   Will follow up in morning.  Expected Discharge Plan: [place] Barriers to Discharge: Continued Medical Work up  Expected Discharge Plan and Services Expected Discharge Plan: [place] arrangements for the past 2 months: Single Family Home Expected Discharge Date: 12/12/19                                     Social Determinants of Health (SDOH) Interventions    Readmission Risk Interventions No flowsheet data found.

## 2019-12-12 NOTE — TOC Initial Note (Signed)
Transition of Care [facility]) - Initial/Assessment Note    Patient Details  Name: Sandy Barrett MRN: [id number] Date of Birth: 11/23/1932  Transition [facility] Care [facility]) CM/SW Contact:    Emeterio Reeve, [place] Phone Number: 12/12/2019, 2:57 PM  Clinical Narrative:                  CSW met with pt at bedside. CSW introduced self and explained her role at the hospital.  Pts daughter was in room. Daughter reports pt has has limited mobility since her stroke in 2016. Daughter reports She has all medical DME needed since she has been providing care for pt. Daughter states pt gets a [place] from [facility], but states nurse services have decreased over time.   CSW reviewed pt/ot reccs. Daughter stated she would be open to [facility] as long as pt was getting wound care. Daughter was given medicare.gov rating scale. Daughter gave csw permission to fax out to facilities.   Expected Discharge Plan: Skilled Nursing Facility Barriers to Discharge: Continued Medical Work up   Patient Goals and CMS Choice Patient states their goals for this hospitalization and ongoing recovery are:: Pt unable to verbalize goals CMS Medicare.gov Compare Post Acute Care list provided to:: Patient Choice offered to / list presented to : Patient  Expected Discharge Plan and Services Expected Discharge Plan: [place]       Living arrangements for the past 2 months: Single Family Home Expected Discharge Date: 12/12/19                                    Prior Living Arrangements/Services Living arrangements for the past 2 months: Single Family Home Lives with:: Adult Children Patient language and need for interpreter reviewed:: Yes Do you feel safe going back to the place where you live?: Yes      Need for Family Participation in Patient Care: Yes (Comment) Care giver support system in place?: Yes (comment)   Criminal Activity/Legal Involvement Pertinent to Current Situation/Hospitalization: No -  Comment as needed  Activities of Daily Living Home Assistive Devices/Equipment: Wheelchair ADL Screening (condition at time of admission) Patient's cognitive ability adequate to safely complete daily activities?: No Is the patient deaf or have difficulty hearing?: No Does the patient have difficulty seeing, even when wearing glasses/contacts?: No Does the patient have difficulty concentrating, remembering, or making decisions?: Yes Patient able to express need for assistance with ADLs?: No Does the patient have difficulty dressing or bathing?: Yes Independently performs ADLs?: No Communication: Dependent (has expressive aphasia) Is this a change from baseline?: Pre-admission baseline Dressing (OT): Dependent Is this a change from baseline?: Pre-admission baseline Grooming: Dependent Is this a change from baseline?: Pre-admission baseline Feeding: Dependent Is this a change from baseline?: Pre-admission baseline Bathing: Dependent Is this a change from baseline?: Pre-admission baseline Toileting: Dependent Is this a change from baseline?: Pre-admission baseline In/Out Bed: Dependent Is this a change from baseline?: Pre-admission baseline Walks in Home: Dependent Is this a change from baseline?: Pre-admission baseline Does the patient have difficulty walking or climbing stairs?: Yes Weakness of Legs: Both Weakness of Arms/Hands: Right  Permission Sought/Granted   Permission granted to share information with : Yes, Verbal Permission Granted     Permission granted to share info w AGENCY: SNF  Permission granted to share info w Relationship: Daughters     Emotional Assessment Appearance:: Appears stated age Attitude/Demeanor/Rapport: Engaged Affect (typically  observed): Happy Orientation: : Oriented to Self Alcohol / Substance Use: Not Applicable Psych Involvement: No (comment)  Admission diagnosis:  Weakness [R53.1] Hypoxia [R09.02] Elevated brain natriuretic peptide  (BNP) level [R79.89] Severe sepsis ([place]) [A41.9, R65.20] Patient Active Problem List   Diagnosis Date Noted  . Acute lower UTI 12/08/2019  . Hypernatremia 12/08/2019  . Severe sepsis ([place]) 12/08/2019  . Weakness 12/07/2019  . Malaise 12/06/2019  . Healthcare maintenance 12/04/2019  . Chronic rhinitis 12/04/2019  . Rhonchi 05/11/2019  . Urinary incontinence without sensory awareness 04/26/2019  . GERD (gastroesophageal reflux disease) 02/12/2019  . Decubitus ulcer of back, unstageable ([place]) 10/25/2018  . Right breast lymphedema 10/18/2018  . Sleep difficulties 05/24/2018  . Right shoulder pain 11/24/2017  . Hallucinations 11/24/2017  . Adhesive capsulitis of right shoulder 11/23/2017  . Bilateral leg edema 05/24/2017  . Cholecystitis 02/23/2017  . Chronic respiratory failure with hypoxia ([place]) 02/23/2017  . Neuralgia 01/09/2017  . Venous congestion 10/08/2016  . Lymphedema of breast 09/11/2016  . Long term (current) use of anticoagulants [Z79.01] 09/10/2016  . Cough 08/23/2016  . Left hip pain 08/09/2016  . Left leg swelling 05/18/2016  . Pressure injury of skin 04/27/2016  . Aspiration pneumonia ([place]) 04/20/2016  . Hemidiaphragm paralysis   . Wheelchair bound 02/03/2016  . Discoloration of skin 09/26/2015  . Essential thrombocytosis ([place]) 07/31/2015  . SVC (superior vena cava obstruction), chronic 07/20/2015  . Dysphagia 07/20/2015  . Constipation 07/20/2015  . Thrombophlebitis of breast, right 06/18/2015  . Tracheal deviation 06/06/2015  . Sensorineural hearing loss, bilateral, moderate-moderately severe 04/30/2015  . Abdominal wall lump 03/09/2015  . Frequent UTI 02/06/2015  . Primary osteoarthritis involving multiple joints 07/26/2014  . Pernicious anemia 07/26/2014  . Encounter for therapeutic drug monitoring 07/17/2014  . Spastic hemiparesis affecting dominant side ([place]), right 05/31/2014  . DVT of lower extremity, bilateral ([place]) 03/14/2014  . Global aphasia  03/14/2014  . 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 Aphasia S/P CVA 03/13/2014  . Cerebral infarction due to embolism of left middle cerebral artery ([place])   . Stroke, embolic ([place]) 29/93/7169  . History of pulmonary embolism 03/08/2014  . Primary localized osteoarthrosis, lower leg 03/06/2013  . IBS (irritable bowel syndrome)   . Multinodular goiter 01/13/2011  . Hyperthyroidism 11/11/2009  . Osteoarthritis 04/21/2008  . Migraine headache 04/20/2008   PCP:  [name], MD Pharmacy:   [id number] [place] [place]Phone: [phone]: [address] [address]Phone: [phone]: [phone] [id number] [place] [place]: [phone]: [phone]   Social Determinants of Health (SDAdair VillageInterventions    Readmission Risk Interventions No flowsheet data found.  MiEmeterio ReeveLCLatanya PresserLCDallas Cityocial Worker [phone]

## 2019-12-12 NOTE — Progress Notes (Signed)
CRITICAL VALUE ALERT  Critical Value:  Platelet 1,004  Date & Time Notied:  12/12/19 3893  Provider Notified: MD Sharlet Salina  Orders Received/Actions taken: waiting for response

## 2019-12-12 NOTE — Progress Notes (Signed)
PROGRESS NOTE    Sandy Barrett  VEL:[id number] DOB: [birth year] DOA: 12/07/2019 PCP: [name], MD    Brief Narrative:  86 year old female with history of stroke and right-sided hemiplegia, history of PE on Coumadin, essential thrombocytosis on hydroxyurea was brought to ER by patient's daughter when she was found with flaccid paralysis of the right upper and lower extremity.  Family reported that she usually has spasticity of the extremities, this time she was very flaccid so they brought her to ER.  They also noticed that she had intermittent cough.  In the emergency room her WBC count was 20.9.  Platelets were more than million.  CT head and CT chest was unremarkable.  MRI brain and MRA of the neck were without any evidence of new stroke.  Urinalysis was concerning for UTI, unfortunately culture was not sent.  Patient was treated with Rocephin for empiric coverage for UTI and also possible aspiration.  She also has decubitus ulcers. 12/7, prepare to discharge to go home and her daughters changed mind and wants her to send to a skilled nursing facility.   Assessment & Plan:   Principal Problem:   Weakness Active Problems:   History of pulmonary embolism   Cerebral infarction due to embolism of left middle cerebral artery (HCC)   Essential thrombocytosis (HCC)   Pressure injury of skin   Acute lower UTI   Hypernatremia   Severe sepsis (HCC)  Generalized weakness in a patient with chronic debility and bedbound status, previous right hemiplegia: Her MRI and MRA of the brain unremarkable, no evidence of new neurological insult. Probably progressive debility due to presence of dehydration, poor oral intake and UTI. Symptomatic treatment.  Leukocytosis and thrombocytosis: Known thrombocytosis on hydroxyurea.  She does have history of leukocytosis, outpatient records shows WBC count more than 14,000 at times.  Continued hydroxyurea. Discussed with patient's hematologist, Dr. Alvy Bimler.   She recommended monitoring.  Do not increase dose of hydroxyurea as it may interfere with healing.  Suspected aspiration: Seen by speech therapy.  She has no evidence of pneumonia on her CT scan chest.  Blood cultures negative.  All-time aspiration precautions. Completed 5 days of Rocephin.  Discontinue.  Stage II and III pressure ulcers on the buttock: Seen by wound care.  Local wound therapy.  Less likely source of infection.  Moderate dehydration with hypernatremia: On half-normal saline with improvement.   Discontinue IV fluids.  Maintain by mouth.  Presumed UTI: On empiric Rocephin.  Unfortunately no urine culture was collected.  Will treat with 5 days of antibiotic therapy.  Completed today.  History of pulmonary embolism on Coumadin: Therapeutic.  Pharmacy to manage.  Chronic right hemiplegia: Stable.  Discussed with both daughters at the bedside.  Initial plan was to go home.  They decided to send her to SNF.   DVT prophylaxis:  warfarin (COUMADIN) tablet 3 mg   Code Status: Partial Family Communication: 2 daughters at the bedside. Disposition Plan: Status is: Inpatient  Remains inpatient appropriate because:Inpatient level of care appropriate due to severity of illness   Dispo: The patient is from: Home              Anticipated d/c is to: Skilled nursing facility.              Anticipated d/c date is: When bed available.              Patient currently is medically stable.         Consultants:  None  Procedures:   None  Antimicrobials:   Rocephin, 12/2--- 12/7.   Subjective: Patient seen and examined.  Her daughters were at the bedside.  No overnight events.  They were worried about her occasional intermittent deep breathing and noisy breathing.  She was awake.  Daughter reported that she is eating as much as she does at home.  Objective: Vitals:   12/11/19 1423 12/11/19 2009 12/12/19 0451 12/12/19 1348  BP: 118/76 125/74 (!) 145/78 131/83  Pulse:  94 91 92 91  Resp: 18 17 16 18   Temp: 98 F (36.7 C) 98.6 F (37 C) 98.3 F (36.8 C) 98 F (36.7 C)  TempSrc: Oral Oral    SpO2: 100% 100% 95% 100%  Weight:      Height:        Intake/Output Summary (Last 24 hours) at 12/12/2019 1523 Last data filed at 12/12/2019 0544 Gross per 24 hour  Intake --  Output 500 ml  Net -500 ml   Filed Weights   12/08/19 1431  Weight: 72.6 kg    Examination:  General exam: Appears calm and comfortable  Sick looking lady on 2 L oxygen that she uses at home. Wakes up for conversation.  She has unintelligible speech but daughter reported that she is able to communicate like this.  She was able to follow commands. Respiratory system: Clear to auscultation.  She has some upper airway sound.  Both inspiratory and expiratory wheezing.  No stridor. Cardiovascular system: S1 & S2 heard, RRR. No JVD, murmurs, rubs, gallops or clicks. No pedal edema. Gastrointestinal system: Abdomen is nondistended, soft and nontender. No organomegaly or masses felt. Normal bowel sounds heard. Central nervous system: Alert and oriented.  Flaccid right hemiplegia.     Data Reviewed: I have personally reviewed following labs and imaging studies  CBC: Recent Labs  Lab 12/06/19 1210 12/06/19 1210 12/07/19 1608 12/07/19 2324 12/08/19 0658 12/09/19 0335 12/10/19 0302 12/11/19 0334 12/12/19 0532  WBC 22.6 Repeated and verified X2.*   < > 20.9*  --  28.3* 28.2* 21.3* 19.8* 18.7*  NEUTROABS 20.3*  --  17.9*  --  26.6*  --  18.6*  --   --   HGB 13.5   < > 13.1   < > 12.8 12.7 12.4 12.3 11.3*  HCT 42.9   < > 42.6   < > 41.2 42.3 41.5 38.7 36.9  MCV 101.2*   < > 103.4*  --  101.5* 102.7* 104.0* 101.0* 101.7*  PLT 868.0*   < > 1,031*  --  1,026* 1,009* 1,031* 847* 1,004*   < > = values in this interval not displayed.   Basic Metabolic Panel: Recent Labs  Lab 12/07/19 1608 12/07/19 1608 12/07/19 2324 12/08/19 0658 12/09/19 1942 12/10/19 0302 12/12/19 0532  NA  148*   < > 149* 147* 140 140 139  K 3.8   < > 3.7 3.5 3.2* 4.7 4.0  CL 114*  --   --  112* 106 105 106  CO2 23  --   --  21* 19* 24 24  GLUCOSE 87  --   --  119* 120* 91 89  BUN 16  --   --  16 11 11  6*  CREATININE 0.31*  --   --  <0.30* 0.34* 0.36* <0.30*  CALCIUM 9.2  --   --  9.0 8.5* 8.6* 8.5*  MG  --   --   --   --   --   --  1.7  PHOS  --   --   --   --   --   --  1.8*   < > = values in this interval not displayed.   GFR: CrCl cannot be calculated (This lab value cannot be used to calculate CrCl because it is not a number: <0.30). Liver Function Tests: Recent Labs  Lab 12/06/19 1210 12/07/19 1608 12/08/19 0658  AST 20 13* 15  ALT 12 9 8   ALKPHOS 83 75 75  BILITOT 0.5 1.0 0.9  PROT 7.8 7.4 7.1  ALBUMIN 3.3* 2.7* 2.5*   No results for input(s): LIPASE, AMYLASE in the last 168 hours. No results for input(s): AMMONIA in the last 168 hours. Coagulation Profile: Recent Labs  Lab 12/08/19 0601 12/09/19 0335 12/10/19 0302 12/11/19 0334 12/12/19 0532  INR 3.9* 4.4* 4.0* 2.6* 1.8*   Cardiac Enzymes: No results for input(s): CKTOTAL, CKMB, CKMBINDEX, TROPONINI in the last 168 hours. BNP (last 3 results) No results for input(s): PROBNP in the last 8760 hours. HbA1C: No results for input(s): HGBA1C in the last 72 hours. CBG: No results for input(s): GLUCAP in the last 168 hours. Lipid Profile: No results for input(s): CHOL, HDL, LDLCALC, TRIG, CHOLHDL, LDLDIRECT in the last 72 hours. Thyroid Function Tests: No results for input(s): TSH, T4TOTAL, FREET4, T3FREE, THYROIDAB in the last 72 hours. Anemia Panel: No results for input(s): VITAMINB12, FOLATE, FERRITIN, TIBC, IRON, RETICCTPCT in the last 72 hours. Sepsis Labs: Recent Labs  Lab 12/07/19 2026  LATICACIDVEN 1.2    Recent Results (from the past 240 hour(s))  SARS Coronavirus 2 by RT PCR (hospital order, performed in [facility] hospital lab) Nasopharyngeal Nasopharyngeal Swab     Status: None   Collection  Time: 12/07/19  9:00 PM   Specimen: Nasopharyngeal Swab  Result Value Ref Range Status   SARS Coronavirus 2 NEGATIVE NEGATIVE Final    Comment: (NOTE) SARS-CoV-2 target nucleic acids are NOT DETECTED.  The SARS-CoV-2 RNA is generally detectable in upper and lower respiratory specimens during the acute phase of infection. The lowest concentration of SARS-CoV-2 viral copies this assay can detect is 250 copies / mL. A negative result does not preclude SARS-CoV-2 infection and should not be used as the sole basis for treatment or other patient management decisions.  A negative result may occur with improper specimen collection / handling, submission of specimen other than nasopharyngeal swab, presence of viral mutation(s) within the areas targeted by this assay, and inadequate number of viral copies (<250 copies / mL). A negative result must be combined with clinical observations, patient history, and epidemiological information.  Fact Sheet for Patients:   StrictlyIdeas.no  Fact Sheet for Healthcare Providers: BankingDealers.co.za  This test is not yet approved or  cleared by the [place] FDA and has been authorized for detection and/or diagnosis of SARS-CoV-2 by FDA under an Emergency Use Authorization (EUA).  This EUA will remain in effect (meaning this test can be used) for the duration of the COVID-19 declaration under Section 564(b)(1) of the Act, 21 U.S.C. section 360bbb-3(b)(1), unless the authorization is terminated or revoked sooner.  Performed at [facility], [place] [address] [place], [place] [address]   Culture, blood (routine x 2)     Status: None   Collection Time: 12/07/19  9:02 PM   Specimen: BLOOD  Result Value Ref Range Status   Specimen Description BLOOD LEFT ANTECUBITAL  Final   Special Requests AEROBIC BOTTLE ONLY Blood Culture adequate volume  Final  Culture   Final    NO GROWTH 5 DAYS Performed at  [facility], [place] [address] [place], [place] [address]    Report Status 12/12/2019 FINAL  Final  Culture, blood (routine x 2)     Status: None   Collection Time: 12/07/19  9:10 PM   Specimen: BLOOD LEFT HAND  Result Value Ref Range Status   Specimen Description BLOOD LEFT HAND  Final   Special Requests   Final    BOTTLES DRAWN AEROBIC AND ANAEROBIC Blood Culture adequate volume   Culture   Final    NO GROWTH 5 DAYS Performed at [facility], [place] [address] [place], [place] [address]    Report Status 12/12/2019 FINAL  Final         Radiology Studies: No results found.      Scheduled Meds: . (feeding supplement) PROSource Plus  30 mL Oral BID BM  . acidophilus  2 capsule Oral Daily  . vitamin C  500 mg Oral Daily  . azelastine  2 spray Each Nare BID  . hydroxyurea  1,000 mg Oral Once per day on Sun Wed Sat  . hydroxyurea  500 mg Oral Once per day on Mon Tue Thu Fri  . multivitamin with minerals  1 tablet Oral Daily  . omega-3 acid ethyl esters  1 capsule Oral Daily  . pantoprazole  40 mg Oral BID  . potassium & sodium phosphates  1 packet Oral TID WC & HS  . pregabalin  50 mg Oral TID  . propranolol  5 mg Oral BID  . Ensure Max Protein  11 oz Oral QHS  . warfarin  3 mg Oral ONCE-1600  . Warfarin - Pharmacist Dosing Inpatient   Does not apply q1600  . zinc sulfate  220 mg Oral Daily   Continuous Infusions:    LOS: 4 days    Time spent: 32 minutes    Barb Merino, MD [facility] Pager [phone]

## 2019-12-12 NOTE — Care Management Important Message (Signed)
Important Message  Patient Details  Name: Sandy Barrett MRN: [id number] Date of Birth: Dec 13, 1932   Medicare Important Message Given:  Yes     Iker Nuttall [address] 12/12/2019, 3:31 PM

## 2019-12-12 NOTE — Progress Notes (Addendum)
ANTICOAGULATION CONSULT NOTE - Follow Up Consult  Pharmacy Consult for Warfarin  Indication: History of PE/DVT, stroke  Allergies  Allergen Reactions  . Valtrex [Valacyclovir Hcl] Cough    Cough and gagging     Vital Signs: Temp: 98.3 F (36.8 C) (12/07 0451) BP: 145/78 (12/07 0451) Pulse Rate: 92 (12/07 0451)  Labs: Recent Labs    12/09/19 1942 12/10/19 0302 12/10/19 0302 12/11/19 0334 12/12/19 0532  HGB  --  12.4   < > 12.3 11.3*  HCT  --  41.5  --  38.7 36.9  PLT  --  1,031*  --  847* 1,004*  LABPROT  --  38.1*  --  27.0* 20.5*  INR  --  4.0*  --  2.6* 1.8*  CREATININE 0.34* 0.36*  --   --  <0.30*   < > = values in this interval not displayed.    CrCl cannot be calculated (This lab value cannot be used to calculate CrCl because it is not a number: <0.30).   Medical History: Past Medical History:  Diagnosis Date  . ARTHRITIS   . Arthritis   . Diverticulosis   . Essential thrombocytosis ([place])   . HYPERTENSION   . HYPERTHYROIDISM   . Hyperthyroidism    s/p I-131 ablation 03/2011 of multinod goiter  . INSOMNIA   . MIGRAINE HEADACHE   . OBESITY   . Posttraumatic stress disorder   . Pulmonary embolism ([place]) 03/2014   with DVT  . Stroke [facility]) 03/2014   dysarthria    Assessment: 86 y/o F presented on 12/07/2019 with R sided weakness found to have concern for UTI/PNA and possible aspiration. Patient is on warfarin prior to admission for history of PE/DVT/stroke. Pharmacy consulted for warfarin dosing. INR supratherapeutic up to 4.4 on admit.  INR down to 1.8 is subtherapeutic after holding warfarin x4 days. Hgb stable. Plt 1004 with essential thrombocytosis. Azithromycin changed to doxycycline on 12/4 due to drug drug interaction with warfarin and increased risk of bleeding. Eating 40% as documented yesterday and 10-50% per calorie count by nutrition.  PTA Warfarin: per 11/17 visit supposed to be on 2mg  TTS, 4mg  AOD, daughter reported 2mg  Sun/Thr, 4mg  AOD to  pharmacy technician during med rec.  Goal of Therapy:  INR 2-3 Monitor platelets by anticoagulation protocol: Yes   Plan:  Warfarin 3mg  x1 today  Monitor daily INR, CBC/plt Monitor for signs/symptoms of bleeding   Cristela Felt, PharmD Clinical Pharmacist

## 2019-12-12 NOTE — NC FL2 (Signed)
[place] MEDICAID FL2 LEVEL OF CARE SCREENING TOOL     IDENTIFICATION  Patient Name: Sandy Barrett Birthdate: 1932-08-24 Sex: female Admission Date (Current Location): 12/07/2019  [facility] and [place] Number:  Herbalist and Address:  [facility], [place] [address], [place], [place] [address]      Provider Number: [id number]  Attending Physician Name and Address:  Barb Merino, MD  Relative Name and Phone Number:       Current Level of Care: Hospital Recommended Level of Care: [place] Prior Approval Number:    Date Approved/Denied:   PASRR Number: [id number]  Discharge Plan: SNF    Current Diagnoses: Patient Active Problem List   Diagnosis Date Noted  . Acute lower UTI 12/08/2019  . Hypernatremia 12/08/2019  . Severe sepsis ([place]) 12/08/2019  . Weakness 12/07/2019  . Malaise 12/06/2019  . Healthcare maintenance 12/04/2019  . Chronic rhinitis 12/04/2019  . Rhonchi 05/11/2019  . Urinary incontinence without sensory awareness 04/26/2019  . GERD (gastroesophageal reflux disease) 02/12/2019  . Decubitus ulcer of back, unstageable ([place]) 10/25/2018  . Right breast lymphedema 10/18/2018  . Sleep difficulties 05/24/2018  . Right shoulder pain 11/24/2017  . Hallucinations 11/24/2017  . Adhesive capsulitis of right shoulder 11/23/2017  . Bilateral leg edema 05/24/2017  . Cholecystitis 02/23/2017  . Chronic respiratory failure with hypoxia ([place]) 02/23/2017  . Neuralgia 01/09/2017  . Venous congestion 10/08/2016  . Lymphedema of breast 09/11/2016  . Long term (current) use of anticoagulants [Z79.01] 09/10/2016  . Cough 08/23/2016  . Left hip pain 08/09/2016  . Left leg swelling 05/18/2016  . Pressure injury of skin 04/27/2016  . Aspiration pneumonia ([place]) 04/20/2016  . Hemidiaphragm paralysis   . Wheelchair bound 02/03/2016  . Discoloration of skin 09/26/2015  . Essential thrombocytosis ([place]) 07/31/2015  . SVC  (superior vena cava obstruction), chronic 07/20/2015  . Dysphagia 07/20/2015  . Constipation 07/20/2015  . Thrombophlebitis of breast, right 06/18/2015  . Tracheal deviation 06/06/2015  . Sensorineural hearing loss, bilateral, moderate-moderately severe 04/30/2015  . Abdominal wall lump 03/09/2015  . Frequent UTI 02/06/2015  . Primary osteoarthritis involving multiple joints 07/26/2014  . Pernicious anemia 07/26/2014  . Encounter for therapeutic drug monitoring 07/17/2014  . Spastic hemiparesis affecting dominant side ([place]), right 05/31/2014  . DVT of lower extremity, bilateral ([place]) 03/14/2014  . Global aphasia 03/14/2014  . 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 Aphasia S/P CVA 03/13/2014  . Cerebral infarction due to embolism of left middle cerebral artery ([place])   . Stroke, embolic ([place]) 79/39/0300  . History of pulmonary embolism 03/08/2014  . Primary localized osteoarthrosis, lower leg 03/06/2013  . IBS (irritable bowel syndrome)   . Multinodular goiter 01/13/2011  . Hyperthyroidism 11/11/2009  . Osteoarthritis 04/21/2008  . Migraine headache 04/20/2008    Orientation RESPIRATION BLADDER Height & Weight     Self, Time, Situation, Place (expressive aphasia)  Normal Continent Weight: 72.6 kg Height:  4\' 11"  (149.9 cm)  BEHAVIORAL SYMPTOMS/MOOD NEUROLOGICAL BOWEL NUTRITION STATUS      Continent Diet  AMBULATORY STATUS COMMUNICATION OF NEEDS Skin   Total Care Verbally (exptressive aphasia) Other (Comment) (Stage 3 x 3 on sacrum, 1) rt upper buttock 2x1x3.5cm, 2)lt upper buttock 1x2x5.6 cmtunneling, 3)2x2.5x2.4 tunneling, clean buttocks with no rinse cleaner, pack tunneling with 1inch iodoform strips and cover ABD pads BID)                       Personal  Care Assistance Level of Assistance  Bathing, Dressing, Feeding, Total care Bathing Assistance: Maximum assistance Feeding assistance: Maximum assistance Dressing Assistance: Maximum assistance Total Care Assistance:  Maximum assistance   Functional Limitations Info             SPECIAL CARE FACTORS FREQUENCY  PT (By licensed PT), OT (By licensed OT)     PT Frequency: five times a week OT Frequency: five times a week            Contractures Contractures Info: Not present    Additional Factors Info  Code Status, Allergies Code Status Info: partial CPR yes, does not want intubation Allergies Info: valtrex           Current Medications (12/12/2019):  This is the current hospital active medication list Current Facility-Administered Medications  Medication Dose Route Frequency Provider Last Rate Last Admin  . (feeding supplement) PROSource Plus liquid 30 mL  30 mL Oral BID BM Barb Merino, MD   30 mL at 12/12/19 0851  . 0.45 % sodium chloride infusion   Intravenous Continuous Swayze, Ava, DO 75 mL/hr at 12/11/19 2122 New Bag at 12/11/19 2122  . acetaminophen (TYLENOL) tablet 650 mg  650 mg Oral Q6H PRN Rise Patience, MD   650 mg at 12/11/19 1148   Or  . acetaminophen (TYLENOL) suppository 650 mg  650 mg Rectal Q6H PRN Rise Patience, MD      . acidophilus (RISAQUAD) capsule 2 capsule  2 capsule Oral Daily Joselyn Glassman A, RPH   2 capsule at 12/12/19 0850  . ascorbic acid (VITAMIN C) tablet 500 mg  500 mg Oral Daily Swayze, Ava, DO   500 mg at 12/12/19 0850  . azelastine (ASTELIN) 0.1 % nasal spray 2 spray  2 spray Each Nare BID Swayze, Ava, DO   2 spray at 12/12/19 1016  . diclofenac Sodium (VOLTAREN) 1 % topical gel 2 g  2 g Topical TID PRN Rise Patience, MD   2 g at 12/10/19 1700  . hydroxyurea (HYDREA) capsule 1,000 mg  1,000 mg Oral Once per day on Sun Wed Sat Rise Patience, MD   1,000 mg at 12/10/19 1034  . hydroxyurea (HYDREA) capsule 500 mg  500 mg Oral Once per day on Mon Tue Thu Fri Rise Patience, MD   500 mg at 12/12/19 0850  . ipratropium-albuterol (DUONEB) 0.5-2.5 (3) MG/3ML nebulizer solution 3 mL  3 mL Nebulization Q6H PRN Rise Patience, MD   3 mL at 12/10/19 6553  . lidocaine (LIDODERM) 5 % 1 patch  1 patch Transdermal Daily PRN Rise Patience, MD      . multivitamin with minerals tablet 1 tablet  1 tablet Oral Daily Barb Merino, MD   1 tablet at 12/12/19 0850  . omega-3 acid ethyl esters (LOVAZA) capsule 1 g  1 capsule Oral Daily Rise Patience, MD   1 g at 12/12/19 0850  . pantoprazole (PROTONIX) EC tablet 40 mg  40 mg Oral BID Rise Patience, MD   40 mg at 12/12/19 0850  . pregabalin (LYRICA) capsule 50 mg  50 mg Oral TID Rise Patience, MD   50 mg at 12/12/19 0850  . propranolol (INDERAL) tablet 5 mg  5 mg Oral BID Swayze, Ava, DO   5 mg at 12/12/19 0851  . protein supplement (ENSURE MAX) liquid  11 oz Oral QHS Ghimire, Kuber, MD      . sodium chloride (OCEAN) 0.65 %  nasal spray 2 spray  2 spray Each Nare PRN Swayze, Ava, DO      . traMADol (ULTRAM) tablet 50 mg  50 mg Oral Q6H PRN Swayze, Ava, DO   50 mg at 12/12/19 0613  . traZODone (DESYREL) tablet 50 mg  50 mg Oral QHS PRN Rise Patience, MD   50 mg at 12/11/19 2120  . warfarin (COUMADIN) tablet 3 mg  3 mg Oral ONCE-1600 Henri Medal, RPH      . Warfarin - Pharmacist Dosing Inpatient   Does not apply q1600 Henri Medal, [facility]      . zinc sulfate capsule 220 mg  220 mg Oral Daily Swayze, Ava, DO   220 mg at 12/12/19 5176     Discharge Medications: Please see discharge summary for a list of discharge medications.  Relevant Imaging Results:  Relevant Lab Results:   Additional Information SSI [id number], Phizer vaccine 02/16/19,03/13/19, 11/07/19 HX MRSA HX CVA with rt sided hemiplegia  Maeleigh Buschman, Edson Snowball, RN

## 2019-12-13 LAB — CBC
HCT: 35.7 % — ABNORMAL LOW (ref 36.0–46.0)
Hemoglobin: 10.9 g/dL — ABNORMAL LOW (ref 12.0–15.0)
MCH: 31.1 pg (ref 26.0–34.0)
MCHC: 30.5 g/dL (ref 30.0–36.0)
MCV: 101.7 fL — ABNORMAL HIGH (ref 80.0–100.0)
Platelets: 1069 10*3/uL (ref 150–400)
RBC: 3.51 MIL/uL — ABNORMAL LOW (ref 3.87–5.11)
RDW: 19.1 % — ABNORMAL HIGH (ref 11.5–15.5)
WBC: 16.1 10*3/uL — ABNORMAL HIGH (ref 4.0–10.5)
nRBC: 0.1 % (ref 0.0–0.2)

## 2019-12-13 LAB — PROTIME-INR
INR: 1.8 — ABNORMAL HIGH (ref 0.8–1.2)
Prothrombin Time: 19.9 seconds — ABNORMAL HIGH (ref 11.4–15.2)

## 2019-12-13 MED ORDER — WARFARIN SODIUM 3 MG PO TABS: 3.0000 mg | ORAL_TABLET | Freq: Every day | ORAL | 0 refills | Status: AC

## 2019-12-13 MED ORDER — WARFARIN SODIUM 4 MG PO TABS
4.0000 mg | ORAL_TABLET | Freq: Once | ORAL | Status: AC
  Administered 2019-12-13: 4 mg via ORAL
  Filled 2019-12-13 (×2): qty 1

## 2019-12-13 MED ORDER — MAGNESIUM OXIDE 400 (241.3 MG) MG PO TABS
400.0000 mg | ORAL_TABLET | Freq: Once | ORAL | Status: AC
  Administered 2019-12-13: 400 mg via ORAL
  Filled 2019-12-13: qty 1

## 2019-12-13 MED ORDER — MAGNESIUM SULFATE 2 GM/50ML IV SOLN
2.0000 g | Freq: Once | INTRAVENOUS | Status: DC
  Filled 2019-12-13: qty 50

## 2019-12-13 NOTE — Progress Notes (Signed)
ANTICOAGULATION CONSULT NOTE - Follow Up Consult  Pharmacy Consult for Warfarin  Indication: History of PE/DVT, stroke  Allergies  Allergen Reactions  . Valtrex [Valacyclovir Hcl] Cough    Cough and gagging     Vital Signs:    Labs: Recent Labs    12/11/19 0334 12/11/19 0334 12/12/19 0532 12/13/19 0123  HGB 12.3   < > 11.3* 10.9*  HCT 38.7  --  36.9 35.7*  PLT 847*  --  1,004* 1,069*  LABPROT 27.0*  --  20.5* 19.9*  INR 2.6*  --  1.8* 1.8*  CREATININE  --   --  <0.30*  --    < > = values in this interval not displayed.    CrCl cannot be calculated (This lab value cannot be used to calculate CrCl because it is not a number: <0.30).   Medical History: Past Medical History:  Diagnosis Date  . ARTHRITIS   . Arthritis   . Diverticulosis   . Essential thrombocytosis ([place])   . HYPERTENSION   . HYPERTHYROIDISM   . Hyperthyroidism    s/p I-131 ablation 03/2011 of multinod goiter  . INSOMNIA   . MIGRAINE HEADACHE   . OBESITY   . Posttraumatic stress disorder   . Pulmonary embolism ([place]) 03/2014   with DVT  . Stroke [facility]) 03/2014   dysarthria    Assessment: 86 y/o F presented on 12/07/2019 with R sided weakness found to have concern for UTI/PNA and possible aspiration. Patient is on warfarin prior to admission for history of PE/DVT/stroke. Pharmacy consulted for warfarin dosing. INR supratherapeutic up to 4.4 on admit.  INR down to 1.8 is subtherapeutic after holding warfarin x4 days, stable after 2 doses of warfarin . Hgb stable. Plt 1000s with essential thrombocytosis. Now eating 40-100%.  PTA Warfarin: per 11/17 visit supposed to be on 2mg  TTS, 4mg  AOD, daughter reported 2mg  Sun/Thr, 4mg  AOD to pharmacy technician during med rec.  Goal of Therapy:  INR 2-3 Monitor platelets by anticoagulation protocol: Yes   Plan:  Warfarin 4mg  x1 today  Monitor daily INR, CBC/plt Monitor for signs/symptoms of bleeding  For discharge, suggest 3mg  daily given supratherapeutic  INR on home dose   Benetta Spar, PharmD, BCPS, [facility] Clinical Pharmacist  Please check AMION for all [place] phone numbers After 10:00 PM, call [place]

## 2019-12-13 NOTE — Progress Notes (Signed)
PROGRESS NOTE    Sandy Barrett  ATF:[id number] DOB: 1932-03-07 DOA: 12/07/2019 PCP: [name], MD   Brief Narrative:  86 year old female with history of stroke and right-sided hemiplegia, history of PE on Coumadin, essential thrombocytosis on hydroxyurea was brought to ER by patient's daughter when she was found with flaccid paralysis of the right upper and lower extremity.  Family reported that she usually has spasticity of the extremities, this time she was very flaccid so they brought her to ER.  They also noticed that she had intermittent cough.  In the emergency room her WBC count was 20.9.  Platelets were more than million.  CT head and CT chest was unremarkable.  MRI brain and MRA of the neck were without any evidence of new stroke.  Urinalysis was concerning for UTI, unfortunately culture was not sent.  Patient was treated with Rocephin for empiric coverage for UTI and also possible aspiration.  She also has decubitus ulcers. 12/7, prepare to discharge to go home and her daughters changed mind and wants her to send to a skilled nursing facility.  Assessment & Plan:  Generalized weakness in the patient with chronic debility and bedbound status with previous known history of right hemiplegia: -MRI and MRA of the brain unremarkable for acute findings.  No evidence of new neurological insult. -Her symptoms are likely related to progressive debility due to presence of dehydration, poor oral intake and UTI. -Continue supportive care and symptomatic treatment  Chronic leukocytosis and thrombocytosis: -Patient has history of known thrombocytosis-on hydroxyurea. -Patient is followed by hematologist Dr. Orvilla Cornwall recommended monitoring and do not increase dose of hydroxyurea as it may interfere with healing.  Suspected aspiration: -Seen by speech therapy.  No evidence of aspiration pneumonia.  No pneumonia noted on CT scan chest.  Blood culture negative.  Patient completed 5 days of  Rocephin. -Patient daughter concerned about her speech.  Reconsulted speech therapy again this morning-recommended regular diet, thin liquid.  No risk for aspiration pneumonia. -Patient is on 3 L of oxygen which is her baseline.  Stage II and III pressure ulcers on the buttock: -Wound care consulted.  Recommended local wound therapy.  Less likely source of infection.  Moderate dehydration with hyponatremia: Resolved.  IV fluid discontinued. -Encourage increased p.o. intake  Presumed UTI: -Patient urine was concerning for infection.  Unfortunately no urine culture was collected.  Patient finished 5 days course of IV Rocephin. -Patient denies any urinary symptoms.  History of PE: On Coumadin as per pharmacy.  Chronic right hemiplegia: At baseline.  Disposition: Patient's daughter tells me this morning that she would like to take patient home concerning about high cases of COVID-19 infection in nursing home (with low vaccination rate) however she changed her mind again and decided to send her to SNF.  Appreciate case manager help.  Patient's daughter have not decided yet on which SNF.  DVT prophylaxis: Coumadin Code Status: Partial code Family Communication: Daughter present at bedside.  Plan of care discussed with patient in length and he verbalized understanding and agreed with it. Disposition Plan: SNF  Consultants:   None  Procedures:   None  Antimicrobials:   Rocephin  Status is: Inpatient  Dispo: The patient is from: Home              Anticipated d/c is to: SNF              Anticipated d/c date is: 1 day  Patient currently is medically stable to d/c.   Subjective: Patient seen and examined.  Daughter at bedside.  Daughter is concerned about patient's swallowing and respiration.  She requested for speech therapy evaluation.  We discussed about negative CT chest resolved from last week.  Patient appears comfortable and no increased amount of oxygen  requirement.  She tells me that she is thinking to take her mom to home and she will take care of her if she gets  help for wound care.  objective: Vitals:   12/12/19 0451 12/12/19 1348 12/12/19 2015 12/13/19 1414  BP: (!) 145/78 131/83 132/74 128/66  Pulse: 92 91 100 88  Resp: 16 18 18 17   Temp: 98.3 F (36.8 C) 98 F (36.7 C) 99.2 F (37.3 C)   TempSrc:   Oral   SpO2: 95% 100% 92% 97%  Weight:      Height:        Intake/Output Summary (Last 24 hours) at 12/13/2019 1527 Last data filed at 12/12/2019 2018 Gross per 24 hour  Intake --  Output 600 ml  Net -600 ml   Filed Weights   12/08/19 1431  Weight: 72.6 kg    Examination:  General exam: Appears calm and comfortable, elderly, obese, on 2 L of oxygen Respiratory system: Clear to auscultation. Respiratory effort normal. Cardiovascular system: S1 & S2 heard, RRR. No JVD, murmurs, rubs, gallops or clicks. No pedal edema. Gastrointestinal system: Abdomen is nondistended, soft and nontender. No organomegaly or masses felt. Normal bowel sounds heard. Central nervous system: Alert, following commands.  Right-sided hemiplegic.   Data Reviewed: I have personally reviewed following labs and imaging studies  CBC: Recent Labs  Lab 12/07/19 1608 12/07/19 2324 12/08/19 0658 12/08/19 0658 12/09/19 0335 12/10/19 0302 12/11/19 0334 12/12/19 0532 12/13/19 0123  WBC 20.9*  --  28.3*   < > 28.2* 21.3* 19.8* 18.7* 16.1*  NEUTROABS 17.9*  --  26.6*  --   --  18.6*  --   --   --   HGB 13.1   < > 12.8   < > 12.7 12.4 12.3 11.3* 10.9*  HCT 42.6   < > 41.2   < > 42.3 41.5 38.7 36.9 35.7*  MCV 103.4*  --  101.5*   < > 102.7* 104.0* 101.0* 101.7* 101.7*  PLT 1,031*  --  1,026*   < > 1,009* 1,031* 847* 1,004* 1,069*   < > = values in this interval not displayed.   Basic Metabolic Panel: Recent Labs  Lab 12/07/19 1608 12/07/19 1608 12/07/19 2324 12/08/19 0658 12/09/19 1942 12/10/19 0302 12/12/19 0532  NA 148*   < > 149* 147*  140 140 139  K 3.8   < > 3.7 3.5 3.2* 4.7 4.0  CL 114*  --   --  112* 106 105 106  CO2 23  --   --  21* 19* 24 24  GLUCOSE 87  --   --  119* 120* 91 89  BUN 16  --   --  16 11 11  6*  CREATININE 0.31*  --   --  <0.30* 0.34* 0.36* <0.30*  CALCIUM 9.2  --   --  9.0 8.5* 8.6* 8.5*  MG  --   --   --   --   --   --  1.7  PHOS  --   --   --   --   --   --  1.8*   < > = values in this interval  not displayed.   GFR: CrCl cannot be calculated (This lab value cannot be used to calculate CrCl because it is not a number: <0.30). Liver Function Tests: Recent Labs  Lab 12/07/19 1608 12/08/19 0658  AST 13* 15  ALT 9 8  ALKPHOS 75 75  BILITOT 1.0 0.9  PROT 7.4 7.1  ALBUMIN 2.7* 2.5*   No results for input(s): LIPASE, AMYLASE in the last 168 hours. No results for input(s): AMMONIA in the last 168 hours. Coagulation Profile: Recent Labs  Lab 12/09/19 0335 12/10/19 0302 12/11/19 0334 12/12/19 0532 12/13/19 0123  INR 4.4* 4.0* 2.6* 1.8* 1.8*   Cardiac Enzymes: No results for input(s): CKTOTAL, CKMB, CKMBINDEX, TROPONINI in the last 168 hours. BNP (last 3 results) No results for input(s): PROBNP in the last 8760 hours. HbA1C: No results for input(s): HGBA1C in the last 72 hours. CBG: No results for input(s): GLUCAP in the last 168 hours. Lipid Profile: No results for input(s): CHOL, HDL, LDLCALC, TRIG, CHOLHDL, LDLDIRECT in the last 72 hours. Thyroid Function Tests: No results for input(s): TSH, T4TOTAL, FREET4, T3FREE, THYROIDAB in the last 72 hours. Anemia Panel: No results for input(s): VITAMINB12, FOLATE, FERRITIN, TIBC, IRON, RETICCTPCT in the last 72 hours. Sepsis Labs: Recent Labs  Lab 12/07/19 2026  LATICACIDVEN 1.2    Recent Results (from the past 240 hour(s))  SARS Coronavirus 2 by RT PCR (hospital order, performed in [facility] hospital lab) Nasopharyngeal Nasopharyngeal Swab     Status: None   Collection Time: 12/07/19  9:00 PM   Specimen: Nasopharyngeal Swab   Result Value Ref Range Status   SARS Coronavirus 2 NEGATIVE NEGATIVE Final    Comment: (NOTE) SARS-CoV-2 target nucleic acids are NOT DETECTED.  The SARS-CoV-2 RNA is generally detectable in upper and lower respiratory specimens during the acute phase of infection. The lowest concentration of SARS-CoV-2 viral copies this assay can detect is 250 copies / mL. A negative result does not preclude SARS-CoV-2 infection and should not be used as the sole basis for treatment or other patient management decisions.  A negative result may occur with improper specimen collection / handling, submission of specimen other than nasopharyngeal swab, presence of viral mutation(s) within the areas targeted by this assay, and inadequate number of viral copies (<250 copies / mL). A negative result must be combined with clinical observations, patient history, and epidemiological information.  Fact Sheet for Patients:   StrictlyIdeas.no  Fact Sheet for Healthcare Providers: BankingDealers.co.za  This test is not yet approved or  cleared by the [place] FDA and has been authorized for detection and/or diagnosis of SARS-CoV-2 by FDA under an Emergency Use Authorization (EUA).  This EUA will remain in effect (meaning this test can be used) for the duration of the COVID-19 declaration under Section 564(b)(1) of the Act, 21 U.S.C. section 360bbb-3(b)(1), unless the authorization is terminated or revoked sooner.  Performed at [facility], [place] [address] [place], [place] [address]   Culture, blood (routine x 2)     Status: None   Collection Time: 12/07/19  9:02 PM   Specimen: BLOOD  Result Value Ref Range Status   Specimen Description BLOOD LEFT ANTECUBITAL  Final   Special Requests AEROBIC BOTTLE ONLY Blood Culture adequate volume  Final   Culture   Final    NO GROWTH 5 DAYS Performed at [facility], [place] [address] [place],   [address]    Report Status 12/12/2019 FINAL  Final  Culture, blood (  routine x 2)     Status: None   Collection Time: 12/07/19  9:10 PM   Specimen: BLOOD LEFT HAND  Result Value Ref Range Status   Specimen Description BLOOD LEFT HAND  Final   Special Requests   Final    BOTTLES DRAWN AEROBIC AND ANAEROBIC Blood Culture adequate volume   Culture   Final    NO GROWTH 5 DAYS Performed at [facility], [address], [place], [place] [address]    Report Status 12/12/2019 FINAL  Final      Radiology Studies: No results found.  Scheduled Meds: . (feeding supplement) PROSource Plus  30 mL Oral BID BM  . acidophilus  2 capsule Oral Daily  . vitamin C  500 mg Oral Daily  . azelastine  2 spray Each Nare BID  . hydroxyurea  1,000 mg Oral Once per day on Sun Wed Sat  . hydroxyurea  500 mg Oral Once per day on Mon Tue Thu Fri  . multivitamin with minerals  1 tablet Oral Daily  . omega-3 acid ethyl esters  1 capsule Oral Daily  . pantoprazole  40 mg Oral BID  . potassium & sodium phosphates  1 packet Oral TID WC & HS  . pregabalin  50 mg Oral TID  . propranolol  5 mg Oral BID  . Ensure Max Protein  11 oz Oral QHS  . warfarin  4 mg Oral ONCE-1600  . Warfarin - Pharmacist Dosing Inpatient   Does not apply q1600  . zinc sulfate  220 mg Oral Daily   Continuous Infusions: . magnesium sulfate bolus IVPB       LOS: 5 days   Time spent: 40 minutes   Avanna Sowder Loann Quill, MD [facility]  If 7PM-7AM, please contact night-coverage [email or web address] 12/13/2019, 3:27 PM

## 2019-12-13 NOTE — Plan of Care (Signed)
  Problem: Education: Goal: Knowledge of General Education information will improve Description: Including pain rating scale, medication(s)/side effects and non-pharmacologic comfort measures Outcome: Adequate for Discharge   Problem: Health Behavior/Discharge Planning: Goal: Ability to manage health-related needs will improve Outcome: Adequate for Discharge   Problem: Clinical Measurements: Goal: Ability to maintain clinical measurements within normal limits will improve Outcome: Adequate for Discharge Goal: Will remain free from infection Outcome: Adequate for Discharge Goal: Diagnostic test results will improve Outcome: Adequate for Discharge Goal: Respiratory complications will improve Outcome: Adequate for Discharge Goal: Cardiovascular complication will be avoided Outcome: Adequate for Discharge   Problem: Activity: Goal: Risk for activity intolerance will decrease Outcome: Adequate for Discharge   Problem: Nutrition: Goal: Adequate nutrition will be maintained Outcome: Adequate for Discharge   Problem: Coping: Goal: Level of anxiety will decrease Outcome: Adequate for Discharge   Problem: Elimination: Goal: Will not experience complications related to bowel motility Outcome: Adequate for Discharge Goal: Will not experience complications related to urinary retention Outcome: Adequate for Discharge   Problem: Pain Managment: Goal: General experience of comfort will improve Outcome: Adequate for Discharge   Problem: Safety: Goal: Ability to remain free from injury will improve Outcome: Adequate for Discharge   Problem: Skin Integrity: Goal: Risk for impaired skin integrity will decrease Outcome: Adequate for Discharge   Problem: Urinary Elimination: Goal: Signs and symptoms of infection will decrease Outcome: Adequate for Discharge   

## 2019-12-13 NOTE — Progress Notes (Signed)
Physical Therapy Treatment and discharge Patient Details Name: Sandy Barrett MRN: [id number] DOB: 07-26-1932 Today's Date: 12/13/2019    History of Present Illness Sandy Barrett is a 86 y.o. female with history of stroke with right-sided hemiplegia, history of pulmonary embolism, essential thrombocytosis. Pt  was having weakness on the right upper and lower extremity.  Patient has previous stroke and is hemiplegic on the right side but as per the family the right side is usually spastic but became more flaccid. MRA/MRI brain negative for acute CVA. UTI noted.    PT Comments    Pt progressing towards goals. Pt requiring total A for rolling tasks today. Pt's daughter reports she plans to take pt home and not to SNF. Reviewed positioning techniques and DME recommendations. Updated recommendations as pt's daughter refusing SNF. Pt's daughter also reporting she would benefit from a new hoyer lift. Pt likely close to baseline and plans to d/c home today; will sign off.  If needs change, please re-consult.  Follow Up Recommendations  Home health PT;Supervision/Assistance - 24 hour (pt's daughter refusing SNF, will require max HH services. )     Equipment Recommendations  Other (comment) (pt daughter requesting new hoyer lift)    Recommendations for Other Services       Precautions / Restrictions Precautions Precautions: Fall Precaution Comments: R side flaccid from previous CVA, multiple sacral wounds Restrictions Weight Bearing Restrictions: No    Mobility  Bed Mobility Overal bed mobility: Needs Assistance Bed Mobility: Rolling Rolling: Total assist         General bed mobility comments: Practiced rolling and had pt hold to rail with RUE X3. Required total A. Repositioned in midline upon completion of session.   Transfers                 General transfer comment: Pt using hoyer at baseline.   Ambulation/Gait                 Stairs              Wheelchair Mobility    Modified Rankin (Stroke Patients Only)       Balance                                            Cognition Arousal/Alertness: Awake/alert Behavior During Therapy: WFL for tasks assessed/performed;Flat affect Overall Cognitive Status: History of cognitive impairments - at baseline                                 General Comments: aphasic at baseline      Exercises      General Comments General comments (skin integrity, edema, etc.): Discussed with daughter about positioning at home and DME needs.       Pertinent Vitals/Pain Pain Assessment: Faces Faces Pain Scale: Hurts little more Pain Location: generalized Pain Descriptors / Indicators: Grimacing;Discomfort Pain Intervention(s): Limited activity within patient's tolerance;Monitored during session;Repositioned    Home Living                      Prior Function            PT Goals (current goals can now be found in the care plan section) Acute Rehab PT Goals Patient Stated Goal: to go home per daughter PT Goal Formulation: All  assessment and education complete, DC therapy Time For Goal Achievement: 12/13/19 Potential to Achieve Goals: Fair Progress towards PT goals: Goals met/education completed, patient discharged from PT    Frequency    Min 2X/week      PT Plan Discharge plan needs to be updated;Equipment recommendations need to be updated    Co-evaluation              AM-PAC PT "6 Clicks" Mobility   Outcome Measure  Help needed turning from your back to your side while in a flat bed without using bedrails?: Total Help needed moving from lying on your back to sitting on the side of a flat bed without using bedrails?: Total Help needed moving to and from a bed to a chair (including a wheelchair)?: Total Help needed standing up from a chair using your arms (e.g., wheelchair or bedside chair)?: Total Help needed to walk in  hospital room?: Total Help needed climbing 3-5 steps with a railing? : Total 6 Click Score: 6    End of Session   Activity Tolerance: Patient limited by pain Patient left: in bed;with call bell/phone within reach;with family/visitor present;with nursing/sitter in room Nurse Communication: Mobility status PT Visit Diagnosis: Muscle weakness (generalized) (M62.81);Other abnormalities of gait and mobility (R26.89);Hemiplegia and hemiparesis Hemiplegia - Right/Left: Right Hemiplegia - caused by: Cerebral infarction     Time: 6073-7106 PT Time Calculation (min) (ACUTE ONLY): 25 min  Charges:  $Therapeutic Activity: 8-22 mins $Self Care/Home Management: 8-22                     Lou Miner, DPT  Acute Rehabilitation Services  Pager: [phone] Office: [phone]    Rudean Hitt 12/13/2019, 5:18 PM

## 2019-12-13 NOTE — Discharge Summary (Addendum)
Physician Discharge Summary  [name] XNA:[id number] DOB: [birth year] DOA: 12/07/2019  PCP: [name], MD  Admit date: 12/07/2019 Discharge date: 12/13/2019  Admitted From: Home Disposition:  Home with [facility]  Recommendations for Outpatient Follow-up:  1. Follow up with PCP in 1 week 2. Repeat CBC in week 3. Repeat PT/INR in 1 week 4. Follow up with oncology outpatient 5. Coumadin 3 mg daily 6. Wound care daily  Home Health: yes Equipment/Devices: none Discharge Condition: stable CODE STATUS:Partial Diet recommendation: Low sodium diet  Brief/Interim Summary: 86 year old female with history of stroke and right-sided hemiplegia, history of PE on Coumadin, essential thrombocytosis on hydroxyurea was brought to ER by patient's daughter when she was found with flaccid paralysis of the right upper and lower extremity. Family reported that she usually has spasticity of the extremities, this time she was very flaccid so they brought her to ER. They also noticed that she had intermittent cough. In the emergency room her WBC count was 20.9. Platelets were more than million. CT head and CT chest was unremarkable. MRI brain and MRA of the neck were without any evidence of new stroke. Urinalysis was concerning for UTI, unfortunately culture was not sent. Patient was treated with Rocephin for empiric coverage for UTI and also possible aspiration. She also has decubitus ulcers.  Generalized weakness in the patient with chronic debility and bedbound status with previous known history of right hemiplegia: -MRI and MRA of the brain unremarkable for acute findings.  No evidence of new neurological insult. -Her symptoms were likely related to progressive debility due to presence of dehydration, poor oral intake and UTI. -Continued supportive care and symptomatic treatment. Her symptoms improved overall.  Chronic leukocytosis and thrombocytosis: -Patient has history of known  thrombocytosis-on hydroxyurea. -Patient is followed by hematologist Dr. Orvilla Cornwall recommended monitoring and do not increase dose of hydroxyurea as it may interfere with healing. -Follow up with oncology outpatient  Suspected aspiration: -Seen by speech therapy.  No evidence of aspiration pneumonia.   -Patient remained afebrile, No tachycardia/tachypenia, LA: WNL.-Sepsis Ruled out. -No pneumonia noted on CT scan chest.  Blood culture negative.  Patient completed 5 days of Rocephin. -Patient daughter concerned about her speech.  Reconsulted speech therapy again this morning-recommended regular diet, thin liquid.  No risk for aspiration pneumonia. -Patient is on 2 L of oxygen which is her baseline.  Stage II and III pressure ulcers on the buttock: -Wound care consulted.  Recommended local wound therapy.  Less likely source of infection.  Moderate dehydration with hypernatremia: Resolved.  IV fluid discontinued. -Encouraged increased p.o. intake  Presumed UTI: -Patient urine was concerning for infection.  Unfortunately no urine culture was collected.  Patient finished 5 days course of IV Rocephin. -Patient denied any urinary symptoms.  History of PE: On Coumadin as per pharmacy. Coumadin 3 mg daily at Discharge as per pharmacy recommendations -CTA chest Negative for PE/pneumonia at admission  Chronic right hemiplegia: At baseline.  Disposition: Patient's daughter changed her mind and wishes to take her home with [facility]. [place] arrangements done prior to Discharge. Comfortable for discharge today.  Discharge Diagnoses:  Generalized weakness in the patient with chronic debility and bedbound status with previous known history of right hemiplegia Chronic leukocytosis & thrombocytosis Suspected aspiration Stage II & III pressure ulcers on buttock Moderate dehydration & Hypernatremia Presumed UTI Hx of PE Chronic Right hemiplegia   Discharge Instructions  Discharge Instructions     Call MD for:  difficulty breathing, headache or visual disturbances  Complete by: As directed    Call MD for:  temperature >100.4   Complete by: As directed    Diet - low sodium heart healthy   Complete by: As directed    Diet - low sodium heart healthy   Complete by: As directed    Discharge instructions   Complete by: As directed    Keep up follow-up with her hematologist about her abnormal blood counts. Schedule follow-up with interventional radiology at [facility] to discuss about thyroid ablation. Continue to take precautions for aspiration while eating.   Discharge instructions   Complete by: As directed    Follow up with PCP in 1 week Repeat CBC & PT/INR on follow up visit Coumadin 3 mg daily Wound care as per wound care recommendations   Discharge wound care:   Complete by: As directed    Clean buttocks with no rinse cleaner. Pack each tunneling wound with saline moistened 1" iodoform strips Kellie Simmering # [phone]) and cover with ABD pads, secure with tape. Change every day at home.   Discharge wound care:   Complete by: As directed    Clean buttocks with no rinse cleaner. Pack each tunneling wound with saline moistened 1" iodoform strips Kellie Simmering # [phone]) and cover with ABD pads, secure with tape.   Increase activity slowly   Complete by: As directed    Increase activity slowly   Complete by: As directed      Allergies as of 12/13/2019      Reactions   Valtrex [valacyclovir Hcl] Cough   Cough and gagging      Medication List    TAKE these medications   acetaminophen 160 MG/5ML liquid Commonly known as: TYLENOL Take 480 mg by mouth every 4 (four) hours as needed for fever or pain.   Align 4 MG Caps Take 4 mg by mouth daily.   azelastine 0.1 % nasal spray Commonly known as: ASTELIN Place 2 sprays into both nostrils 2 (two) times daily. Use in each nostril as directed   Chest Congestion Relief 400 MG Tabs tablet Generic drug: guaifenesin Take 400 mg by mouth 3 (three)  times daily.   DECUBI-VITE PO Take 1 tablet by mouth daily.   fexofenadine-pseudoephedrine 60-120 MG 12 hr tablet Commonly known as: ALLEGRA-D Take 1 tablet by mouth 2 (two) times daily.   hydroxyurea 500 MG capsule Commonly known as: HYDREA TAKE 1 TABLET BY MOUTH DAILY EXCEPT 2 TABLETS ( 1000 MG ) ON WEDNESDAY, SATURDAY AND SUNDAY What changed:   how much to take  how to take this  when to take this   ipratropium-albuterol 0.5-2.5 (3) MG/3ML Soln Commonly known as: DUONEB Take 3 mLs by nebulization every 6 (six) hours as needed (shortness of breath).   lidocaine 5 % Commonly known as: LIDODERM Place 1 patch daily onto the skin. Remove & Discard patch within 12 hours or as directed by MD What changed:   when to take this  reasons to take this   NONFORMULARY OR COMPOUNDED ITEM Topical cream with equal parts diclofenac, gabapentin, lidocaine, menthol  Disp 100 gm   Omega 3 1200 MG Caps Take 1 capsule by mouth daily.   OXYGEN 2lpm 24/7  DME- [facility]   polyethylene glycol 17 g packet Commonly known as: MIRALAX / GLYCOLAX TAKE 17G BY MOUTH TWICE A DAY What changed: See the new instructions.   pregabalin 50 MG capsule Commonly known as: LYRICA TAKE 1 CAPSULE BY MOUTH THREE TIMES A DAY What changed: See the  new instructions.   propranolol 10 MG tablet Commonly known as: INDERAL TAKE 0.5 TABLETS (5 MG TOTAL) BY MOUTH 2 (TWO) TIMES DAILY.   Protonix 40 mg/20 mL Pack Generic drug: pantoprazole sodium TAKE 20 MLS (40 MG TOTAL) BY MOUTH 2 (TWO) TIMES DAILY. What changed: See the new instructions.   Q-Care Coverd Y[place]auer/Suction Misc Use as directed   traMADol 50 MG tablet Commonly known as: ULTRAM TAKE 2 TABLETS BY MOUTH 3 TIMES A DAY AS NEEDED What changed: See the new instructions.   traZODone 50 MG tablet Commonly known as: DESYREL TAKE 1 TABLET (50 MG TOTAL) BY MOUTH AT BEDTIME AS NEEDED. FOR SLEEP   Voltaren 1 % Gel Generic drug: diclofenac  Sodium APPLY 2 G TOPICALLY 3 (THREE) TIMES DAILY AS NEEDED. What changed: See the new instructions.   warfarin 3 MG tablet Commonly known as: Coumadin Take as directed. If you are unsure how to take this medication, talk to your nurse or doctor. Original instructions: Take 1 tablet (3 mg total) by mouth daily. Or as directed. Next INR due 12/15/19 What changed:   medication strength  how much to take  how to take this  when to take this  additional instructions            Discharge Care Instructions  (From admission, onward)         Start     Ordered   12/13/19 0000  Discharge wound care:       Comments: Clean buttocks with no rinse cleaner. Pack each tunneling wound with saline moistened 1" iodoform strips Kellie Simmering # [phone]) and cover with ABD pads, secure with tape.   12/13/19 1701   12/12/19 0000  Discharge wound care:       Comments: Clean buttocks with no rinse cleaner. Pack each tunneling wound with saline moistened 1" iodoform strips Kellie Simmering # [phone]) and cover with ABD pads, secure with tape. Change every day at home.   12/12/19 1154          Follow-up Information    [name], MD Follow up in 2 week(s).   Specialty: Internal Medicine Contact information: [place] [address] [phone]              Allergies  Allergen Reactions  . Valtrex [Valacyclovir Hcl] Cough    Cough and gagging    Consultations:  none   Procedures/Studies: DG Chest 2 View  Result Date: 12/07/2019 CLINICAL DATA:  Shortness of breath. Additional history provided: Patient reports shortness of breath for 1 day, history of hypertension and stroke. Additional history obtained from EMR: Patient with elevated white blood cell count, cough and congestion. EXAM: CHEST - 2 VIEW COMPARISON:  Prior chest radiographs 12/06/2019. CTA neck 04/11/2019. FINDINGS: Soft tissue prominence within the lower neck and upper mediastinum with marked rightward deviation  of the trachea. This finding corresponds with the large goiter demonstrated on the prior CTA neck of 04/11/2019. Mild cardiomegaly, unchanged. Aortic atherosclerosis. New from yesterday's radiograph, there is apparent hazy opacity within the left upper to mid lung field. Mild opacity within the left lung base has an appearance most suggestive of atelectasis. The right lung remains clear. Possible trace left pleural effusion, unchanged. No evidence of pneumothorax. No acute bony abnormality identified. Prominent left glenohumeral joint degenerative changes. IMPRESSION: Apparent hazy opacity within the left upper to mid lung field, yesterday's chest radiographs and suspicious for pneumonia. Possible trace left pleural effusion, unchanged. Mild cardiomegaly. Aortic Atherosclerosis (ICD10-I70.0).  Large thyroid goiter with marked rightward tracheal displacement. Electronically Signed   By: Kellie Simmering DO   On: 12/07/2019 16:44   DG Chest 2 View  Result Date: 12/06/2019 CLINICAL DATA:  Cough.  Known large goiter.  Aspiration pneumonia. EXAM: CHEST - 2 VIEW COMPARISON:  05/11/2019 FINDINGS: Rightward tracheal deviation with upper mediastinal soft tissue density compatible with known goiter. Similar appearance to prior. Atherosclerotic calcification of the aortic arch. Stable vascular calcifications projecting over the left scapula. Borderline enlargement of the cardiopericardial silhouette. Mild blunting of the left posterior costophrenic angle, suggesting trace left pleural effusion. Chronic prominence of left lung pulmonary vasculature especially in the upper lobe, as shown on prior chest CT of/21/20. Thoracic spondylosis. Notable bilateral degenerative glenohumeral arthropathy. IMPRESSION: 1. Probable trace left pleural effusion. 2. Borderline enlargement of the cardiopericardial silhouette. 3. Chronic prominence of left lung pulmonary vasculature. Electronically Signed   By: Van Clines M.D.   On:  12/06/2019 12:35   CT Head Wo Contrast  Result Date: 12/07/2019 CLINICAL DATA:  Acute neuro deficit. EXAM: CT HEAD WITHOUT CONTRAST TECHNIQUE: Contiguous axial images were obtained from the base of the skull through the vertex without intravenous contrast. COMPARISON:  CT head 10/05/2017 FINDINGS: Brain: Large territory acute left MCA infarct unchanged. This involves the basal ganglia and frontal parietal lobe and a portion of the temporal lobe. Negative for acute infarct, hemorrhage, mass. Vascular: Negative for hyperdense vessel Skull: Negative Sinuses/Orbits: Mild mucosal edema maxillary sinus bilaterally. Negative orbit Other: None IMPRESSION: No acute abnormality. Large  chronic infarct left MCA territory. Electronically Signed   By: Franchot Gallo M.D.   On: 12/07/2019 20:23   CT Chest Wo Contrast  Result Date: 12/07/2019 CLINICAL DATA:  Shortness of breath EXAM: CT CHEST WITHOUT CONTRAST TECHNIQUE: Multidetector CT imaging of the chest was performed following the standard protocol without IV contrast. COMPARISON:  Chest x-ray from earlier in the same day as well as the previous day. FINDINGS: Cardiovascular: Somewhat limited due to lack of IV contrast. Mild coronary calcifications are seen. Aortic calcifications are noted without aneurysmal dilatation. No cardiac enlargement is noted. Chest wall collaterals are noted likely related to some central venous stenosis. This may be related to the underlying goiter. Mediastinum/Nodes: Thoracic inlet again demonstrates a significant multinodular goiter considerably larger on the left than the right with significant tracheal deviation to the right. This is similar to that seen on prior plain film examination as well as multiple previous CTs of the chest and neck. Previous treatment for thyrotoxicosis has been performed. No sizable hilar or mediastinal adenopathy is noted. The esophagus is deviated to the right and air filled but otherwise within normal limits.  Lungs/Pleura: Lungs are well aerated bilaterally. The previously seen increased density in the left upper lobe is likely related to patient rotation and overlying soft tissue as no focal infiltrate is seen. The calcifications over the left upper lobe are related to changes in the musculature surrounding the left scapula. Scattered areas of scarring are noted similar to that seen on prior CT from 2020. No acute infiltrate or sizable parenchymal nodule is noted. Upper Abdomen: Visualized upper abdomen demonstrates multiple dependent gallstones and gallbladder sludge. The remainder of the upper abdomen is within normal limits. Musculoskeletal: Degenerative changes of the thoracic spine are noted. No acute rib abnormality is noted. Mild compression deformities of L1 through L3 are seen but appear chronic in nature. IMPRESSION: Increased density in the left chest on recent chest x-ray is artifactual in nature  related to overlying soft tissue changes. No acute infiltrate is seen. Calcifications seen on recent chest x-ray are related to the muscle surrounding the left scapula. Large multinodular goiter left greater than right similar to that seen on multiple prior CTs dating back to 2012. This has been significantly worked up and treated. No followup recommended (ref: J Am Coll Radiol. 2015 Feb;12(2): 143-50). Chronic scarring in the lungs bilaterally. Aortic Atherosclerosis (ICD10-I70.0). Electronically Signed   By: Inez Catalina M.D.   On: 12/07/2019 20:33   MR BRAIN WO CONTRAST  Result Date: 12/08/2019 CLINICAL DATA:  Right upper extremity weakness and altered mental status EXAM: MRI HEAD WITHOUT CONTRAST MRI CERVICAL SPINE WITHOUT CONTRAST TECHNIQUE: Multiplanar, multiecho pulse sequences of the brain and surrounding structures, and cervical spine, to include the craniocervical junction and cervicothoracic junction, were obtained without intravenous contrast. COMPARISON:  None. FINDINGS: MRI HEAD FINDINGS Brain: No  acute infarct, mass effect or extra-axial collection. No acute or chronic hemorrhage. There is multifocal hyperintense T2-weighted signal within the white matter. Generalized volume loss without a clear lobar predilection. Additionally, there is a large, old left MCA territory infarct. The midline structures are normal. Vascular: Abnormal left ICA flow void is likely chronic. Skull and upper cervical spine: Normal calvarium and skull base. Visualized upper cervical spine and soft tissues are normal. Sinuses/Orbits:No paranasal sinus fluid levels or advanced mucosal thickening. No mastoid or middle ear effusion. Normal orbits. MRI CERVICAL SPINE FINDINGS Alignment: Physiologic. Vertebrae: No fracture, evidence of discitis, or bone lesion. Cord: Normal signal and morphology. Posterior Fossa, vertebral arteries, paraspinal tissues: Enlarged and heterogeneous thyroid gland. Disc levels: Examination is degraded by motion, limiting assessment of foraminal patency. C2-3: No disc herniation. No spinal canal or neural foraminal stenosis. C3-4: No spinal canal stenosis.  No neural impingement. C4-5: Small central disc protrusion without spinal canal stenosis. No neural impingement. C5-6: Small central disc protrusion without spinal canal stenosis. No neural foraminal stenosis. C6-7: Small disc bulge with narrowing of the ventral thecal sac but no spinal canal stenosis. No neural impingement. C7-T1: No spinal canal stenosis.  No foraminal stenosis. IMPRESSION: 1. No acute intracranial abnormality. 2. Large, old left MCA territory infarct and findings of chronic small vessel disease. 3. Motion degraded examination of the cervical spine. Mild cervical degenerative disc disease without spinal canal or neural foraminal stenosis. 4. Enlarged and heterogeneous thyroid gland. Recommend thyroid ultrasound (ref: J Am Coll Radiol. 2015 Feb;12(2): 143-50). Electronically Signed   By: Ulyses Jarred M.D.   On: 12/08/2019 03:03   MR  CERVICAL SPINE WO CONTRAST  Result Date: 12/08/2019 CLINICAL DATA:  Right upper extremity weakness and altered mental status EXAM: MRI HEAD WITHOUT CONTRAST MRI CERVICAL SPINE WITHOUT CONTRAST TECHNIQUE: Multiplanar, multiecho pulse sequences of the brain and surrounding structures, and cervical spine, to include the craniocervical junction and cervicothoracic junction, were obtained without intravenous contrast. COMPARISON:  None. FINDINGS: MRI HEAD FINDINGS Brain: No acute infarct, mass effect or extra-axial collection. No acute or chronic hemorrhage. There is multifocal hyperintense T2-weighted signal within the white matter. Generalized volume loss without a clear lobar predilection. Additionally, there is a large, old left MCA territory infarct. The midline structures are normal. Vascular: Abnormal left ICA flow void is likely chronic. Skull and upper cervical spine: Normal calvarium and skull base. Visualized upper cervical spine and soft tissues are normal. Sinuses/Orbits:No paranasal sinus fluid levels or advanced mucosal thickening. No mastoid or middle ear effusion. Normal orbits. MRI CERVICAL SPINE FINDINGS Alignment: Physiologic. Vertebrae: No fracture,  evidence of discitis, or bone lesion. Cord: Normal signal and morphology. Posterior Fossa, vertebral arteries, paraspinal tissues: Enlarged and heterogeneous thyroid gland. Disc levels: Examination is degraded by motion, limiting assessment of foraminal patency. C2-3: No disc herniation. No spinal canal or neural foraminal stenosis. C3-4: No spinal canal stenosis.  No neural impingement. C4-5: Small central disc protrusion without spinal canal stenosis. No neural impingement. C5-6: Small central disc protrusion without spinal canal stenosis. No neural foraminal stenosis. C6-7: Small disc bulge with narrowing of the ventral thecal sac but no spinal canal stenosis. No neural impingement. C7-T1: No spinal canal stenosis.  No foraminal stenosis. IMPRESSION:  1. No acute intracranial abnormality. 2. Large, old left MCA territory infarct and findings of chronic small vessel disease. 3. Motion degraded examination of the cervical spine. Mild cervical degenerative disc disease without spinal canal or neural foraminal stenosis. 4. Enlarged and heterogeneous thyroid gland. Recommend thyroid ultrasound (ref: J Am Coll Radiol. 2015 Feb;12(2): 143-50). Electronically Signed   By: Ulyses Jarred M.D.   On: 12/08/2019 03:03   ECHOCARDIOGRAM COMPLETE  Result Date: 12/09/2019    ECHOCARDIOGRAM REPORT   Patient Name:   ROSENDA GEFFRARD Date of Exam: 12/09/2019 Medical Rec #:  [id number]        Height:       59.0 in Accession #:    [id number]       Weight:       160.0 lb Date of Birth:  [birth year]        BSA:          1.677 m Patient Age:    86 years         BP:           131/86 mmHg Patient Gender: F                HR:           81 bpm. Exam Location:  Inpatient Procedure: 2D Echo, Intracardiac Opacification Agent, Color Doppler and Cardiac            Doppler Indications:    history of pulmonary embolism on Coumadin  History:        Patient has no prior history of Echocardiogram examinations.  Sonographer:    Darlina Sicilian RDCS Referring Phys: [id number] AVA SWAYZE IMPRESSIONS  1. Left ventricular ejection fraction, by estimation, is 60 to 65%. The left ventricle has normal function. The left ventricle has no regional wall motion abnormalities. There is mild left ventricular hypertrophy. Left ventricular diastolic parameters are consistent with Grade I diastolic dysfunction (impaired relaxation).  2. Right ventricular systolic function is mildly reduced. The right ventricular size is normal. Tricuspid regurgitation signal is inadequate for assessing PA pressure.  3. The mitral valve is normal in structure. No evidence of mitral valve regurgitation.  4. The aortic valve is tricuspid. Aortic valve regurgitation is not visualized. No aortic stenosis is present.  5. The inferior vena cava is  normal in size with <50% respiratory variability, suggesting right atrial pressure of 8 mmHg. FINDINGS  Left Ventricle: Left ventricular ejection fraction, by estimation, is 60 to 65%. The left ventricle has normal function. The left ventricle has no regional wall motion abnormalities. Definity contrast agent was given IV to delineate the left ventricular  endocardial borders. The left ventricular internal cavity size was small. There is mild left ventricular hypertrophy. Left ventricular diastolic parameters are consistent with Grade I diastolic dysfunction (impaired relaxation). Right Ventricle: The right ventricular size is normal. No  increase in right ventricular wall thickness. Right ventricular systolic function is mildly reduced. Tricuspid regurgitation signal is inadequate for assessing PA pressure. Left Atrium: Left atrial size was normal in size. Right Atrium: Right atrial size was normal in size. Pericardium: Trivial pericardial effusion is present. Mitral Valve: The mitral valve is normal in structure. No evidence of mitral valve regurgitation. Tricuspid Valve: The tricuspid valve is normal in structure. Tricuspid valve regurgitation is trivial. Aortic Valve: The aortic valve is tricuspid. Aortic valve regurgitation is not visualized. No aortic stenosis is present. Pulmonic Valve: The pulmonic valve was not well visualized. Pulmonic valve regurgitation is not visualized. Aorta: The aortic root and ascending aorta are structurally normal, with no evidence of dilitation. Venous: The inferior vena cava is normal in size with less than 50% respiratory variability, suggesting right atrial pressure of 8 mmHg. IAS/Shunts: The interatrial septum was not well visualized.  LEFT VENTRICLE PLAX 2D LVIDd:         3.50 cm  Diastology LVIDs:         2.60 cm  LV e' medial:    6.20 cm/s LV PW:         1.00 cm  LV E/e' medial:  8.5 LV IVS:        1.00 cm  LV e' lateral:   4.46 cm/s LVOT diam:     1.60 cm  LV E/e' lateral:  11.8 LV SV:         38 LV SV Index:   23 LVOT Area:     2.01 cm  RIGHT VENTRICLE TAPSE (M-mode): 1.4 cm LEFT ATRIUM           Index       RIGHT ATRIUM           Index LA diam:      2.10 cm 1.25 cm/m  RA Area:     12.40 cm LA Vol (A2C): 11.2 ml 6.68 ml/m  RA Volume:   26.20 ml  15.62 ml/m LA Vol (A4C): 23.0 ml 13.71 ml/m  AORTIC VALVE LVOT Vmax:   111.00 cm/s LVOT Vmean:  77.000 cm/s LVOT VTI:    0.189 m  AORTA Ao Root diam: 2.70 cm Ao Asc diam:  3.00 cm MITRAL VALVE MV Area (PHT): 2.22 cm    SHUNTS MV Decel Time: 342 msec    Systemic VTI:  0.19 m MV E velocity: 52.70 cm/s  Systemic Diam: 1.60 cm MV A velocity: 86.10 cm/s MV E/A ratio:  0.61 Oswaldo Milian MD Electronically signed by Oswaldo Milian MD Signature Date/Time: 12/09/2019/3:34:24 PM    Final        Subjective:  Pt seen & examined. Daughter at bedside. No new events overnight. Daughter wishes to take her mom to home & comfortable for discharge today.  Discharge Exam: Vitals:   12/12/19 2015 12/13/19 1414  BP: 132/74 128/66  Pulse: 100 88  Resp: 18 17  Temp: 99.2 F (37.3 C)   SpO2: 92% 97%   Vitals:   12/12/19 0451 12/12/19 1348 12/12/19 2015 12/13/19 1414  BP: (!) 145/78 131/83 132/74 128/66  Pulse: 92 91 100 88  Resp: 16 18 18 17   Temp: 98.3 F (36.8 C) 98 F (36.7 C) 99.2 F (37.3 C)   TempSrc:   Oral   SpO2: 95% 100% 92% 97%  Weight:      Height:        General: Pt is alert, awake, not in acute distress, on 2 L, elederly Cardiovascular: RRR,  S1/S2 +, no rubs, no gallops Respiratory: CTA bilaterally, no wheezing, no rhonchi Abdominal: Soft, NT, ND, bowel sounds + Extremities: no edema, no cyanosis    The results of significant diagnostics from this hospitalization (including imaging, microbiology, ancillary and laboratory) are listed below for reference.     Microbiology: Recent Results (from the past 240 hour(s))  SARS Coronavirus 2 by RT PCR (hospital order, performed in [facility]  hospital lab) Nasopharyngeal Nasopharyngeal Swab     Status: None   Collection Time: 12/07/19  9:00 PM   Specimen: Nasopharyngeal Swab  Result Value Ref Range Status   SARS Coronavirus 2 NEGATIVE NEGATIVE Final    Comment: (NOTE) SARS-CoV-2 target nucleic acids are NOT DETECTED.  The SARS-CoV-2 RNA is generally detectable in upper and lower respiratory specimens during the acute phase of infection. The lowest concentration of SARS-CoV-2 viral copies this assay can detect is 250 copies / mL. A negative result does not preclude SARS-CoV-2 infection and should not be used as the sole basis for treatment or other patient management decisions.  A negative result may occur with improper specimen collection / handling, submission of specimen other than nasopharyngeal swab, presence of viral mutation(s) within the areas targeted by this assay, and inadequate number of viral copies (<250 copies / mL). A negative result must be combined with clinical observations, patient history, and epidemiological information.  Fact Sheet for Patients:   StrictlyIdeas.no  Fact Sheet for Healthcare Providers: BankingDealers.co.za  This test is not yet approved or  cleared by the [place] FDA and has been authorized for detection and/or diagnosis of SARS-CoV-2 by FDA under an Emergency Use Authorization (EUA).  This EUA will remain in effect (meaning this test can be used) for the duration of the COVID-19 declaration under Section 564(b)(1) of the Act, 21 U.S.C. section 360bbb-3(b)(1), unless the authorization is terminated or revoked sooner.  Performed at [facility], [place] [address] [place], [place] [address]   Culture, blood (routine x 2)     Status: None   Collection Time: 12/07/19  9:02 PM   Specimen: BLOOD  Result Value Ref Range Status   Specimen Description BLOOD LEFT ANTECUBITAL  Final   Special Requests AEROBIC BOTTLE ONLY Blood  Culture adequate volume  Final   Culture   Final    NO GROWTH 5 DAYS Performed at [facility], [place] [address], [place], [place] [address]    Report Status 12/12/2019 FINAL  Final  Culture, blood (routine x 2)     Status: None   Collection Time: 12/07/19  9:10 PM   Specimen: BLOOD LEFT HAND  Result Value Ref Range Status   Specimen Description BLOOD LEFT HAND  Final   Special Requests   Final    BOTTLES DRAWN AEROBIC AND ANAEROBIC Blood Culture adequate volume   Culture   Final    NO GROWTH 5 DAYS Performed at [facility], [place] [address] [place], [place] [address]    Report Status 12/12/2019 FINAL  Final     Labs: BNP (last 3 results) Recent Labs    12/07/19 2026  BNP 5,726.2*   Basic Metabolic Panel: Recent Labs  Lab 12/07/19 1608 12/07/19 1608 12/07/19 2324 12/08/19 0658 12/09/19 1942 12/10/19 0302 12/12/19 0532  NA 148*   < > 149* 147* 140 140 139  K 3.8   < > 3.7 3.5 3.2* 4.7 4.0  CL 114*  --   --  112* 106 105 106  CO2 23  --   --  21* 19* 24 24  GLUCOSE 87  --   --  119* 120* 91 89  BUN 16  --   --  16 11 11  6*  CREATININE 0.31*  --   --  <0.30* 0.34* 0.36* <0.30*  CALCIUM 9.2  --   --  9.0 8.5* 8.6* 8.5*  MG  --   --   --   --   --   --  1.7  PHOS  --   --   --   --   --   --  1.8*   < > = values in this interval not displayed.   Liver Function Tests: Recent Labs  Lab 12/07/19 1608 12/08/19 0658  AST 13* 15  ALT 9 8  ALKPHOS 75 75  BILITOT 1.0 0.9  PROT 7.4 7.1  ALBUMIN 2.7* 2.5*   No results for input(s): LIPASE, AMYLASE in the last 168 hours. No results for input(s): AMMONIA in the last 168 hours. CBC: Recent Labs  Lab 12/07/19 1608 12/07/19 2324 12/08/19 0658 12/08/19 0658 12/09/19 0335 12/10/19 0302 12/11/19 0334 12/12/19 0532 12/13/19 0123  WBC 20.9*  --  28.3*   < > 28.2* 21.3* 19.8* 18.7* 16.1*  NEUTROABS 17.9*  --  26.6*  --   --  18.6*  --   --   --   HGB 13.1   < > 12.8   < > 12.7 12.4 12.3 11.3* 10.9*  HCT  42.6   < > 41.2   < > 42.3 41.5 38.7 36.9 35.7*  MCV 103.4*  --  101.5*   < > 102.7* 104.0* 101.0* 101.7* 101.7*  PLT 1,031*  --  1,026*   < > 1,009* 1,031* 847* 1,004* 1,069*   < > = values in this interval not displayed.   Cardiac Enzymes: No results for input(s): CKTOTAL, CKMB, CKMBINDEX, TROPONINI in the last 168 hours. BNP: Invalid input(s): POCBNP CBG: No results for input(s): GLUCAP in the last 168 hours. D-Dimer No results for input(s): DDIMER in the last 72 hours. Hgb A1c No results for input(s): HGBA1C in the last 72 hours. Lipid Profile No results for input(s): CHOL, HDL, LDLCALC, TRIG, CHOLHDL, LDLDIRECT in the last 72 hours. Thyroid function studies No results for input(s): TSH, T4TOTAL, T3FREE, THYROIDAB in the last 72 hours.  Invalid input(s): FREET3 Anemia work up No results for input(s): VITAMINB12, FOLATE, FERRITIN, TIBC, IRON, RETICCTPCT in the last 72 hours. Urinalysis    Component Value Date/Time   COLORURINE YELLOW 12/08/2019 0106   APPEARANCEUR CLOUDY (A) 12/08/2019 0106   LABSPEC >1.030 (H) 12/08/2019 0106   PHURINE 5.0 12/08/2019 0106   GLUCOSEU NEGATIVE 12/08/2019 0106   GLUCOSEU NEGATIVE 08/22/2014 1700   HGBUR LARGE (A) 12/08/2019 0106   BILIRUBINUR NEGATIVE 12/08/2019 0106   KETONESUR NEGATIVE 12/08/2019 0106   PROTEINUR NEGATIVE 12/08/2019 0106   UROBILINOGEN 0.2 08/22/2014 1700   NITRITE POSITIVE (A) 12/08/2019 0106   LEUKOCYTESUR MODERATE (A) 12/08/2019 0106   Sepsis Labs Invalid input(s): PROCALCITONIN,  WBC,  LACTICIDVEN Microbiology Recent Results (from the past 240 hour(s))  SARS Coronavirus 2 by RT PCR (hospital order, performed in [facility] hospital lab) Nasopharyngeal Nasopharyngeal Swab     Status: None   Collection Time: 12/07/19  9:00 PM   Specimen: Nasopharyngeal Swab  Result Value Ref Range Status   SARS Coronavirus 2 NEGATIVE NEGATIVE Final    Comment: (NOTE) SARS-CoV-2 target nucleic acids are NOT DETECTED.  The  SARS-CoV-2 RNA is generally detectable in upper and lower respiratory specimens during the acute phase of infection. The lowest concentration of SARS-CoV-2 viral copies this assay can detect is 250 copies / mL. A negative result does not preclude SARS-CoV-2 infection and should not be used as the sole basis for treatment or other patient management decisions.  A negative result may occur with improper specimen collection / handling, submission of specimen other than nasopharyngeal swab, presence of viral mutation(s) within the areas targeted by this assay, and inadequate number of viral copies (<250 copies / mL). A negative result must be combined with clinical observations, patient history, and epidemiological information.  Fact Sheet for Patients:   StrictlyIdeas.no  Fact Sheet for Healthcare Providers: BankingDealers.co.za  This test is not yet approved or  cleared by the [place] FDA and has been authorized for detection and/or diagnosis of SARS-CoV-2 by FDA under an Emergency Use Authorization (EUA).  This EUA will remain in effect (meaning this test can be used) for the duration of the COVID-19 declaration under Section 564(b)(1) of the Act, 21 U.S.C. section 360bbb-3(b)(1), unless the authorization is terminated or revoked sooner.  Performed at [facility], [place] [address], [place], [place] [address]   Culture, blood (routine x 2)     Status: None   Collection Time: 12/07/19  9:02 PM   Specimen: BLOOD  Result Value Ref Range Status   Specimen Description BLOOD LEFT ANTECUBITAL  Final   Special Requests AEROBIC BOTTLE ONLY Blood Culture adequate volume  Final   Culture   Final    NO GROWTH 5 DAYS Performed at [facility], [place] [address] [place], [place] [address]    Report Status 12/12/2019 FINAL  Final  Culture, blood (routine x 2)     Status: None   Collection Time: 12/07/19  9:10 PM   Specimen: BLOOD  LEFT HAND  Result Value Ref Range Status   Specimen Description BLOOD LEFT HAND  Final   Special Requests   Final    BOTTLES DRAWN AEROBIC AND ANAEROBIC Blood Culture adequate volume   Culture   Final    NO GROWTH 5 DAYS Performed at [facility], [place] [address], [place], [place] [address]    Report Status 12/12/2019 FINAL  Final     Time coordinating discharge: Over 30 minutes  SIGNED:   Mckinley Jewel, MD  [facility] 12/13/2019, 5:02 PM Pager   If 7PM-7AM, please contact night-coverage [email or web address]

## 2019-12-13 NOTE — TOC Progression Note (Addendum)
Transition of Care [facility]) - Progression Note    Patient Details  Name: Alexa Blish MRN: [id number] Date of Birth: December 31, 1932  Transition of Care [facility]) CM/SW Contact  Avrielle Fry, Edson Snowball, RN Phone Number: 12/13/2019, 11:17 AM  Clinical Narrative:    Spoke to Amy with [facility], they will increase HHRN visits to three times a week, however at this point they are not sure for how long. Elizabeth with [facility] will call [name] directly to discuss. NCM explained same to teresa . When NCM called teresa'a cell phone call would not go through. NCM reached [name] on her mother's hospital phone. [name] will check her cell phone. NCM provided [facility] with hospital room number. Once [name] talks to [facility] she will decide on home vs SNF, she already has SNF bed offers.  D[place] has spoken with Benjamine Mola with [facility] regarding visits. [name] would like to speak to another home health agency to see if she could get more visits. NCM will ask Tommi Rumps with [name] to call [name]. [name] voiced understanding.   NCM spoke to [place] he will call [name] shortly.   [name] has spoken to [facility] with [facility] , she has decided on SNF. She has not decided on which SNF , she is going to make some calls. NCM will follow up later today for decision.  Gray has decided to take her mother home, she has wheel chair, wheelchair van, hospital bed and a hoyer. Daughter does not want PTAR transport home. Amy with [facility] aware and will schedule a visit for tomorrow. Daughter has a portable oxygen tank  Expected Discharge Plan: [place] Barriers to Discharge: Continued Medical Work up  Expected Discharge Plan and Services Expected Discharge Plan: [place] arrangements for the past 2 months: Single Family Home Expected Discharge Date: 12/12/19                                     Social Determinants of Health (SDOH) Interventions    Readmission Risk  Interventions No flowsheet data found.

## 2019-12-13 NOTE — Evaluation (Signed)
Clinical/Bedside Swallow Evaluation Patient Details  Name: Paradise Vensel MRN: [id number] Date of Birth: [birth year]  Today's Date: 12/13/2019 Time: SLP Start Time (ACUTE ONLY): 1000 SLP Stop Time (ACUTE ONLY): 1100 SLP Time Calculation (min) (ACUTE ONLY): 60 min  Past Medical History:  Past Medical History:  Diagnosis Date  . ARTHRITIS   . Arthritis   . Diverticulosis   . Essential thrombocytosis ([place])   . HYPERTENSION   . HYPERTHYROIDISM   . Hyperthyroidism    s/p I-131 ablation 03/2011 of multinod goiter  . INSOMNIA   . MIGRAINE HEADACHE   . OBESITY   . Posttraumatic stress disorder   . Pulmonary embolism ([place]) 03/2014   with DVT  . Stroke [facility]) 03/2014   dysarthria   Past Surgical History:  Past Surgical History:  Procedure Laterality Date  . ABDOMINAL HYSTERECTOMY  1976  . BREAST SURGERY     biopsy  . RADIOLOGY WITH ANESTHESIA Left 03/08/2014   Procedure: RADIOLOGY WITH ANESTHESIA;  Surgeon: Rob Hickman, MD;  Location: [place];  Service: Radiology;  Laterality: Left;  . TONSILLECTOMY    . TOTAL HIP ARTHROPLASTY  1998    right   HPI:  [name] Weiand is a 86 y.o. female with history of stroke with right-sided hemiplegia, history of pulmonary embolism on Coumadin, essential thrombocytosis on hydroxyurea was brought to the ER after patient's family found that patient was having weakness on the right upper and lower extremity.  Patient has previous stroke and is hemiplegic on the right side but as per the family the right side is usually spastic but became more flaccid.  In addition patient also was found to be having some cough. CT head and CT chest was unremarkable.  MRI brain and MRA neck was done which were unremarkable.  Patient's UA was concerning for UTI was started on empiric antibiotics. Pt had an OP MBS on 05/26/19 that showed premature spillage, penetration with thin, particularly with consecutive sips, mild residue. Recommended to consume dys2/3 solids with thin  liquids by cup, follow solids with liquids.    Assessment / Plan / Recommendation Clinical Impression  Pts dysphagia is a stable mild oral post stroke, oral impairment with some cognitive impairment that waxes and wanes. Daughter wanted to f/u with some concerns she had about pts intermittent difficulty recognizing a straw and occasinoal mild anterior spillage. SLP observed daughter feed pt her meal and offered additional strategies to increase pts awareness of PO and feeding method. We discussed how mentation can impact swallowing and what to expect. We also discussed appropriate food textures and ways to maximize nutrition given that pt has a limited appetite. Daughter is doing a very good job of following precautions. No further interventions needed at this time.  SLP Visit Diagnosis: Dysphagia, unspecified (R13.10)    Aspiration Risk       Diet Recommendation Regular;Thin liquid   Liquid Administration via: Cup;Straw    Other  Recommendations     Follow up Recommendations None      Frequency and Duration            Prognosis        Swallow Study   General HPI: [name] Merida is a 86 y.o. female with history of stroke with right-sided hemiplegia, history of pulmonary embolism on Coumadin, essential thrombocytosis on hydroxyurea was brought to the ER after patient's family found that patient was having weakness on the right upper and lower extremity.  Patient has previous stroke and is hemiplegic on the  right side but as per the family the right side is usually spastic but became more flaccid.  In addition patient also was found to be having some cough. CT head and CT chest was unremarkable.  MRI brain and MRA neck was done which were unremarkable.  Patient's UA was concerning for UTI was started on empiric antibiotics. Pt had an OP MBS on 05/26/19 that showed premature spillage, penetration with thin, particularly with consecutive sips, mild residue. Recommended to consume dys2/3  solids with thin liquids by cup, follow solids with liquids.  Type of Study: Bedside Swallow Evaluation Previous Swallow Assessment: BSE this admission, called by prior to d/c to address daughtes concerns History of Recent Intubation: No Behavior/Cognition: Alert;Cooperative;Requires cueing Oral Cavity Assessment: Within Functional Limits Oral Care Completed by SLP: No Oral Cavity - Dentition: Dentures, top Vision: Functional for self-feeding Self-Feeding Abilities: Needs assist Patient Positioning: Upright in bed Baseline Vocal Quality: Normal Volitional Cough: Strong Volitional Swallow: Able to elicit    Oral/Motor/Sensory Function Overall Oral Motor/Sensory Function: Moderate impairment Facial ROM: Reduced right;Suspected CN VII (facial) dysfunction Facial Symmetry: Abnormal symmetry right;Suspected CN VII (facial) dysfunction Facial Strength: Reduced right;Suspected CN VII (facial) dysfunction Facial Sensation: Within Functional Limits Lingual ROM: Within Functional Limits Lingual Symmetry: Within Functional Limits Lingual Strength: Within Functional Limits Lingual Sensation: Within Functional Limits Velum: Within Functional Limits Mandible: Within Functional Limits   Ice Chips     Thin Liquid Thin Liquid: Within functional limits Presentation: Straw    Nectar Thick Nectar Thick Liquid: Not tested   Honey Thick Honey Thick Liquid: Not tested   Puree Puree: Within functional limits Presentation: Spoon   Solid     Solid: Impaired Oral Phase Functional Implications: Prolonged oral transit      Chania Kochanski, Katherene Ponto 12/13/2019,12:16 PM

## 2019-12-15 ENCOUNTER — Telehealth: Payer: Self-pay

## 2019-12-15 NOTE — Telephone Encounter (Signed)
error 

## 2019-12-15 NOTE — Telephone Encounter (Cosign Needed)
Transition Care Management Follow-up Telephone Call  Date of discharge and from where: 12/14/2019 from [facility]  How have you been since you were released from the hospital? Looking a whole lot better today  Any questions or concerns? No  Items Reviewed:  Did the pt receive and understand the discharge instructions provided? Yes   Medications obtained and verified? Yes   Other? Yes   Any new allergies since your discharge? No   Dietary orders reviewed? Yes (low sodium low diet) Do you have support at home? Yes  (live in daughter)  [place] and Equipment/Supplies: Were home health services ordered? Yes, already had in place If so, what is the name of the agency? [facility]  Has the agency set up a time to come to the patient's home? Left detailed message for daughter to return call Were any new equipment or medical supplies ordered?  No What is the name of the medical supply agency? no Were you able to get the supplies/equipment? not applicable Do you have any questions related to the use of the equipment or supplies? No  Functional Questionnaire: (I = Independent and D = Dependent) ADLs: D  Bathing/Dressing- D  Meal Prep- D  Eating- I  Maintaining continence- D  Transferring/Ambulation- D (WHEELCHAIR)  Managing Meds- D  Follow up appointments reviewed:   PCP Hospital f/u appt confirmed? Yes  Scheduled to see Billey Gosling, MD on 12/22/2019 @ [facility] f/u appt confirmed? No    Are transportation arrangements needed? No   If their condition worsens, is the pt aware to call PCP or go to the Emergency Dept.? Yes  Was the patient provided with contact information for the PCP's office or ED? Yes  Was to pt encouraged to call back with questions or concerns? Yes

## 2019-12-18 ENCOUNTER — Ambulatory Visit (INDEPENDENT_AMBULATORY_CARE_PROVIDER_SITE_OTHER): Payer: Medicare Other | Admitting: General Practice

## 2019-12-18 DIAGNOSIS — M150 Primary generalized (osteo)arthritis: Secondary | ICD-10-CM | POA: Diagnosis not present

## 2019-12-18 DIAGNOSIS — L89153 Pressure ulcer of sacral region, stage 3: Secondary | ICD-10-CM | POA: Diagnosis not present

## 2019-12-18 DIAGNOSIS — I69351 Hemiplegia and hemiparesis following cerebral infarction affecting right dominant side: Secondary | ICD-10-CM | POA: Diagnosis not present

## 2019-12-18 DIAGNOSIS — L89323 Pressure ulcer of left buttock, stage 3: Secondary | ICD-10-CM | POA: Diagnosis not present

## 2019-12-18 DIAGNOSIS — I69320 Aphasia following cerebral infarction: Secondary | ICD-10-CM | POA: Diagnosis not present

## 2019-12-18 DIAGNOSIS — Z7901 Long term (current) use of anticoagulants: Secondary | ICD-10-CM

## 2019-12-18 DIAGNOSIS — I972 Postmastectomy lymphedema syndrome: Secondary | ICD-10-CM | POA: Diagnosis not present

## 2019-12-18 LAB — POCT INR: INR: 2.8 (ref 2.0–3.0)

## 2019-12-18 NOTE — Progress Notes (Signed)
I have reviewed the results and agree with this plan   

## 2019-12-18 NOTE — Patient Instructions (Signed)
Pre visit review using our clinic review tool, if applicable. No additional management support is needed unless otherwise documented below in the visit note.  Patient's dosage was changed in the hospital to 3 mg daily.  So continue to take 3mg  daily and re-check in 2 weeks. Dosing instructions given to Kathlee Nations, RN @ [facility] and also called to patient's daughter [name].

## 2019-12-20 ENCOUNTER — Emergency Department (HOSPITAL_COMMUNITY): Payer: Medicare Other

## 2019-12-20 ENCOUNTER — Emergency Department (HOSPITAL_COMMUNITY)
Admission: EM | Admit: 2019-12-20 | Discharge: 2020-01-06 | Disposition: E | Payer: Medicare Other | Attending: Emergency Medicine | Admitting: Emergency Medicine

## 2019-12-20 ENCOUNTER — Telehealth: Payer: Self-pay | Admitting: Internal Medicine

## 2019-12-20 DIAGNOSIS — J9601 Acute respiratory failure with hypoxia: Secondary | ICD-10-CM | POA: Insufficient documentation

## 2019-12-20 DIAGNOSIS — Z96641 Presence of right artificial hip joint: Secondary | ICD-10-CM | POA: Insufficient documentation

## 2019-12-20 DIAGNOSIS — T17900A Unspecified foreign body in respiratory tract, part unspecified causing asphyxiation, initial encounter: Secondary | ICD-10-CM | POA: Diagnosis not present

## 2019-12-20 DIAGNOSIS — K5939 Other megacolon: Secondary | ICD-10-CM | POA: Diagnosis not present

## 2019-12-20 DIAGNOSIS — T17800A Unspecified foreign body in other parts of respiratory tract causing asphyxiation, initial encounter: Secondary | ICD-10-CM

## 2019-12-20 DIAGNOSIS — Z7901 Long term (current) use of anticoagulants: Secondary | ICD-10-CM | POA: Insufficient documentation

## 2019-12-20 DIAGNOSIS — J90 Pleural effusion, not elsewhere classified: Secondary | ICD-10-CM | POA: Diagnosis not present

## 2019-12-20 DIAGNOSIS — I10 Essential (primary) hypertension: Secondary | ICD-10-CM | POA: Insufficient documentation

## 2019-12-20 DIAGNOSIS — R000 Tachycardia, unspecified: Secondary | ICD-10-CM | POA: Diagnosis not present

## 2019-12-20 DIAGNOSIS — R0689 Other abnormalities of breathing: Secondary | ICD-10-CM | POA: Diagnosis not present

## 2019-12-20 DIAGNOSIS — R0902 Hypoxemia: Secondary | ICD-10-CM | POA: Diagnosis not present

## 2019-12-20 DIAGNOSIS — K6389 Other specified diseases of intestine: Secondary | ICD-10-CM | POA: Diagnosis not present

## 2019-12-20 DIAGNOSIS — R231 Pallor: Secondary | ICD-10-CM | POA: Diagnosis not present

## 2019-12-20 DIAGNOSIS — Z4682 Encounter for fitting and adjustment of non-vascular catheter: Secondary | ICD-10-CM | POA: Diagnosis not present

## 2019-12-20 DIAGNOSIS — R404 Transient alteration of awareness: Secondary | ICD-10-CM | POA: Diagnosis not present

## 2019-12-20 MED ORDER — FENTANYL CITRATE (PF) 100 MCG/2ML IJ SOLN: 50.0000 ug | INTRAMUSCULAR | Status: DC | PRN

## 2019-12-20 MED ORDER — ROCURONIUM BROMIDE 50 MG/5ML IV SOLN: 100.0000 mg | Freq: Once | INTRAVENOUS | Status: DC

## 2019-12-20 MED ORDER — ETOMIDATE 2 MG/ML IV SOLN: 20.0000 mg | Freq: Once | INTRAVENOUS | Status: DC

## 2019-12-20 MED ORDER — FENTANYL CITRATE (PF) 100 MCG/2ML IJ SOLN
50.0000 ug | INTRAMUSCULAR | Status: DC | PRN
  Filled 2019-12-20: qty 2

## 2019-12-20 MED ORDER — SODIUM CHLORIDE 0.9 % IV SOLN: INTRAVENOUS | Status: DC

## 2019-12-20 MED ORDER — SODIUM CHLORIDE 0.9 % IV BOLUS (SEPSIS): 1000.0000 mL | Freq: Once | INTRAVENOUS | Status: DC

## 2019-12-20 MED ORDER — PIPERACILLIN-TAZOBACTAM 3.375 G IVPB 30 MIN: 3.3750 g | Freq: Once | INTRAVENOUS | Status: DC

## 2019-12-20 MED ORDER — PROPOFOL 1000 MG/100ML IV EMUL: 0.0000 ug/kg/min | INTRAVENOUS | Status: DC

## 2019-12-20 NOTE — Telephone Encounter (Signed)
Patient called and was wondering if the patient can stop guaifenesin (CHEST CONGESTION RELIEF) 400 MG TABS tablet  And add Mucinex D and make azelastine (ASTELIN) 0.1 % nasal spray  And fexofenadine-pseudoephedrine (ALLEGRA-D) 60-120 MG 12  PRN.    Phone: [phone]

## 2019-12-20 NOTE — ED Provider Notes (Signed)
Procedure Name: Intubation Date/Time: 12/12/2019 11:41 PM Performed by: Rodney Booze, PA-C Pre-anesthesia Checklist: Patient identified, Emergency Drugs available, Suction available and Patient being monitored Oxygen Delivery Method: Non-rebreather mask Preoxygenation: Pre-oxygenation with 100% oxygen Induction Type: Rapid sequence Ventilation: Mask ventilation without difficulty Laryngoscope Size: Mac and 4 Tube type: Subglottic suction tube Tube size: 7.5 mm Number of attempts: 1 Airway Equipment and Method: Rigid stylet Placement Confirmation: ETT inserted through vocal cords under direct vision Secured at: 23 cm Tube secured with: ETT holder Dental Injury: Injury to lip        Sandy Barrett, Devynn Scheff S, PA-C 12/26/2019 2348    Fatima Blank, MD 12-Jan-2020 [phone]

## 2019-12-20 NOTE — ED Triage Notes (Signed)
Pt bib EMS from home. Daughter states that pt started vomiting at around 2025 and vomiting lasted for about 25 minutes before EMS arrived. Pt has been lethargic and not her self since last hospital visit pert daughter  Hx of stroke in 2016 which left pt with right sided paralysis, and aphasic speech  EMS reports rhonchi breath sounds  Vitals: HR: 140 O2: 70's on 15L NRB

## 2019-12-20 NOTE — ED Provider Notes (Addendum)
S[facility] Provider Note  CSN: [id number] Arrival date & time: 12/20/2019 2314  Chief Complaint(s) No chief complaint on file. ED Triage Notes Anastasia Pall, RN (Registered Nurse) . [name] . [name] Date of Service: 12/20/2019 11:15 PM . . Signed   Pt bib EMS from home. Daughter states that pt started vomiting at around 2025 and vomiting lasted for about 25 minutes before EMS arrived. Pt has been lethargic and not her self since last hospital visit pert daughter  Hx of stroke in 2016 which left pt with right sided paralysis, and aphasic speech  EMS reports rhonchi breath sounds  Vitals: HR: 140 O2: 70's on 15L NRB      HPI Sandy Barrett is a 86 y.o. female here for hypoxia and lethargy. Likely aspiration from volumous emesis at home.  Remainder of history, ROS, and physical exam limited due to patient's condition (AMS). Additional information was obtained from EMS.   Level V Caveat.    HPI  Past Medical History Past Medical History:  Diagnosis Date  . ARTHRITIS   . Arthritis   . Diverticulosis   . Essential thrombocytosis ([place])   . HYPERTENSION   . HYPERTHYROIDISM   . Hyperthyroidism    s/p I-131 ablation 03/2011 of multinod goiter  . INSOMNIA   . MIGRAINE HEADACHE   . OBESITY   . Posttraumatic stress disorder   . Pulmonary embolism ([place]) 03/2014   with DVT  . Stroke [facility]) 03/2014   dysarthria   Patient Active Problem List   Diagnosis Date Noted  . Acute lower UTI 12/08/2019  . Hypernatremia 12/08/2019  . Severe sepsis ([place]) 12/08/2019  . Weakness 12/07/2019  . Malaise 12/06/2019  . Healthcare maintenance 12/04/2019  . Chronic rhinitis 12/04/2019  . Rhonchi 05/11/2019  . Urinary incontinence without sensory awareness 04/26/2019  . GERD (gastroesophageal reflux disease) 02/12/2019  . Decubitus ulcer of back, unstageable ([place]) 10/25/2018  . Right breast lymphedema 10/18/2018  . Sleep difficulties  05/24/2018  . Right shoulder pain 11/24/2017  . Hallucinations 11/24/2017  . Adhesive capsulitis of right shoulder 11/23/2017  . Bilateral leg edema 05/24/2017  . Cholecystitis 02/23/2017  . Chronic respiratory failure with hypoxia ([place]) 02/23/2017  . Neuralgia 01/09/2017  . Venous congestion 10/08/2016  . Lymphedema of breast 09/11/2016  . Long term (current) use of anticoagulants [Z79.01] 09/10/2016  . Cough 08/23/2016  . Left hip pain 08/09/2016  . Left leg swelling 05/18/2016  . Pressure injury of skin 04/27/2016  . Aspiration pneumonia ([place]) 04/20/2016  . Hemidiaphragm paralysis   . Wheelchair bound 02/03/2016  . Discoloration of skin 09/26/2015  . Essential thrombocytosis ([place]) 07/31/2015  . SVC (superior vena cava obstruction), chronic 07/20/2015  . Dysphagia 07/20/2015  . Constipation 07/20/2015  . Thrombophlebitis of breast, right 06/18/2015  . Tracheal deviation 06/06/2015  . Sensorineural hearing loss, bilateral, moderate-moderately severe 04/30/2015  . Abdominal wall lump 03/09/2015  . Frequent UTI 02/06/2015  . Primary osteoarthritis involving multiple joints 07/26/2014  . Pernicious anemia 07/26/2014  . Encounter for therapeutic drug monitoring 07/17/2014  . Spastic hemiparesis affecting dominant side ([place]), right 05/31/2014  . DVT of lower extremity, bilateral ([place]) 03/14/2014  . Global aphasia 03/14/2014  . 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 Aphasia S/P CVA 03/13/2014  . Cerebral infarction due to embolism of left middle cerebral artery ([place])   . Stroke, embolic ([place]) 76/19/5093  . History of pulmonary embolism 03/08/2014  . Primary localized osteoarthrosis, lower leg 03/06/2013  .  IBS (irritable bowel syndrome)   . Multinodular goiter 01/13/2011  . Hyperthyroidism 11/11/2009  . Osteoarthritis 04/21/2008  . Migraine headache 04/20/2008   Home Medication(s) Prior to Admission medications   Medication Sig Start Date End Date Taking? Authorizing Provider   acetaminophen (TYLENOL) 160 MG/5ML liquid Take 480 mg by mouth every 4 (four) hours as needed for fever or pain.    [provider]  azelastine (ASTELIN) 0.1 % nasal spray Place 2 sprays into both nostrils 2 (two) times daily. Use in each nostril as directed 05/15/19   Noemi Chapel P, DO  fexofenadine-pseudoephedrine (ALLEGRA-D) 60-120 MG 12 hr tablet Take 1 tablet by mouth 2 (two) times daily.    [provider]  guaifenesin (CHEST CONGESTION RELIEF) 400 MG TABS tablet Take 400 mg by mouth 3 (three) times daily.    [provider]  hydroxyurea (HYDREA) 500 MG capsule TAKE 1 TABLET BY MOUTH DAILY EXCEPT 2 TABLETS ( 1000 MG ) ON WEDNESDAY, [place] SUNDAY Patient taking differently: Take 500 mg by mouth See admin instructions. TAKE 1 TABLET BY MOUTH DAILY EXCEPT 2 TABLETS ( 1000 MG ) ON WEDNESDAY, SATURDAY AND SUNDAY 11/24/19   Alvy Bimler, Ni, MD  ipratropium-albuterol (DUONEB) 0.5-2.5 (3) MG/3ML SOLN Take 3 mLs by nebulization every 6 (six) hours as needed (shortness of breath). 09/30/18   [name], MD  lidocaine (LIDODERM) 5 % Place 1 patch daily onto the skin. Remove & Discard patch within 12 hours or as directed by MD Patient taking differently: Place 1 patch onto the skin daily as needed (pain). Remove & Discard patch within 12 hours or as directed by MD 11/09/16   Nche, Charlene Brooke, NP  Multiple Vitamins-Minerals (DECUBI-VITE PO) Take 1 tablet by mouth daily.    [provider]  NONFORMULARY OR COMPOUNDED ITEM Topical cream with equal parts diclofenac, gabapentin, lidocaine, menthol  Disp 100 gm 07/01/17   Burns, Claudina Lick, MD  Omega 3 1200 MG CAPS Take 1 capsule by mouth daily.    [provider]  Oral Hygiene Products (Q-CARE COVERD YANKAUER/SUCTION) MISC Use as directed 02/23/17   [name], MD  OXYGEN 2lpm 24/7  DME- [facility]    [provider]  polyethylene glycol (MIRALAX / GLYCOLAX) packet TAKE 17G BY MOUTH TWICE A DAY Patient  taking differently: Take 17 g by mouth 2 (two) times daily as needed for mild constipation. As needed 03/12/16   [name], MD  pregabalin (LYRICA) 50 MG capsule TAKE 1 CAPSULE BY MOUTH THREE TIMES A DAY Patient taking differently: Take 50 mg by mouth 3 (three) times daily.  11/13/19   [name], MD  Probiotic Product (ALIGN) 4 MG CAPS Take 4 mg by mouth daily.     [provider]  propranolol (INDERAL) 10 MG tablet TAKE 0.5 TABLETS (5 MG TOTAL) BY MOUTH 2 (TWO) TIMES DAILY. 07/06/19   Burns, Claudina Lick, MD  PROTONIX 40 MG PACK TAKE 20 MLS (40 MG TOTAL) BY MOUTH 2 (TWO) TIMES DAILY. Patient taking differently: Take 20 mLs by mouth 2 (two) times daily.  10/23/19   Burns, Claudina Lick, MD  traMADol (ULTRAM) 50 MG tablet TAKE 2 TABLETS BY MOUTH 3 TIMES A DAY AS NEEDED Patient taking differently: Take 100 mg by mouth 3 (three) times daily as needed for moderate pain.  10/30/19   [name], MD  traZODone (DESYREL) 50 MG tablet TAKE 1 TABLET (50 MG TOTAL) BY MOUTH AT BEDTIME AS NEEDED. FOR  SLEEP 11/06/19   Burns, Claudina Lick, MD  VOLTAREN 1 % GEL APPLY 2 G TOPICALLY 3 (THREE) TIMES DAILY AS NEEDED. Patient taking differently: Apply 2 g topically 3 (three) times daily as needed (pain).  08/28/19   [name], MD  warfarin (COUMADIN) 3 MG tablet Take 1 tablet (3 mg total) by mouth daily. Or as directed. Next INR due 12/15/19 12/13/19 01/12/20  Mckinley Jewel, MD                                                                                                                                    Past Surgical History Past Surgical History:  Procedure Laterality Date  . ABDOMINAL HYSTERECTOMY  1976  . BREAST SURGERY     biopsy  . RADIOLOGY WITH ANESTHESIA Left 03/08/2014   Procedure: RADIOLOGY WITH ANESTHESIA;  Surgeon: Rob Hickman, MD;  Location: [facility];  Service: Radiology;  Laterality: Left;  . TONSILLECTOMY    . TOTAL HIP ARTHROPLASTY  1998    right   Family History Family History   Problem Relation Age of Onset  . Asthma Mother   . Asthma Father   . Prostate cancer Father   . Stroke Brother   . Breast cancer Sister   . Stomach cancer Sister   . Thyroid disease Neg Hx     Social History Social History   Tobacco Use  . Smoking status: Never Smoker  . Smokeless tobacco: Never Used  Substance Use Topics  . Alcohol use: No    Alcohol/week: 0.0 standard drinks  . Drug use: No   Allergies Valtrex [valacyclovir hcl]  Review of Systems Review of Systems  Unable to perform ROS: Mental status change    Physical Exam Vital Signs  I have reviewed the triage vital signs SpO2 (!) 60%  BP 134/75 HR 132  Physical Exam Vitals reviewed.  Constitutional:      General: She is not in acute distress.    Appearance: She is well-developed and well-nourished. She is ill-appearing and toxic-appearing.  HENT:     Head: Normocephalic and atraumatic.     Comments: Vomitus in OP    Nose: Nose normal.  Eyes:     General: No scleral icterus.       Right eye: No discharge.        Left eye: No discharge.     Extraocular Movements: EOM normal.     Conjunctiva/sclera: Conjunctivae normal.     Pupils: Pupils are equal, round, and reactive to light.  Cardiovascular:     Rate and Rhythm: Tachycardia present. Rhythm irregularly irregular.     Heart sounds: No murmur heard. No friction rub. No gallop.   Pulmonary:     Effort: Tachypnea and respiratory distress present.     Breath sounds: Transmitted upper airway sounds present. No stridor. Rhonchi (throughout) present.  Abdominal:     General: There is no distension.  Palpations: Abdomen is soft.     Tenderness: There is no abdominal tenderness.  Musculoskeletal:        General: No tenderness or edema.     Cervical back: Normal range of motion and neck supple.  Skin:    General: Skin is warm and dry.     Findings: No erythema or rash.  Neurological:     Mental Status: She is unresponsive.  Psychiatric:         Mood and Affect: Mood and affect normal.     ED Results and Treatments Labs (all labs ordered are listed, but only abnormal results are displayed) Labs Reviewed  RESP PANEL BY RT-PCR (FLU A&B, COVID) ARPGX2  CULTURE, BLOOD (SINGLE)  LACTIC ACID, PLASMA  LACTIC ACID, PLASMA  COMPREHENSIVE METABOLIC PANEL  CBC WITH DIFFERENTIAL/PLATELET  PROTIME-INR  APTT  TRIGLYCERIDES  I-STAT CHEM 8, ED                                                                                                                         EKG  EKG Interpretation  Date/Time:  Wednesday December 20 2019 23:16:21 EST Ventricular Rate:  165 PR Interval:    QRS Duration: 97 QT Interval:  286 QTC Calculation: 437 R Axis:   96 Text Interpretation: Atrial fibrillation with rapid V-rate Multiple ventricular premature complexes Right axis deviation Low voltage, extremity and precordial leads Confirmed by Addison Lank [phone]) on 12/30/2019 11:49:05 PM      Radiology DG Chest Portable 1 View  Result Date: 01-14-2020 CLINICAL DATA:  Intubation. EXAM: PORTABLE CHEST 1 VIEW COMPARISON:  Radiograph 12/07/2019. CT 12/07/2019 FINDINGS: The endotracheal tube tip is 3.4 cm from the carina. The enteric tube projects over the upper chest. Stable heart size and mediastinal contours. Widening of the upper mediastinum and rightward tracheal deviation due to thyroid goiter. No pneumothorax or pleural effusion. Improving left upper lobe opacity. Bones are under mineralized. Calcifications projecting over the periphery of the left upper lung are within the left rotator cuff musculature on CT. IMPRESSION: 1. Endotracheal tube tip 3.4 cm from the carina. 2. Enteric tube projects over the upper chest. 3. Rightward tracheal deviation and widening of the upper mediastinum due to thyroid goiter. 4. Improving left upper lobe opacity. Electronically Signed   By: Keith Rake M.D.   On: 2020/01/14 00:01   DG Abd Portable 1 View  Result  Date: January 14, 2020 CLINICAL DATA:  Orogastric tube placement EXAM: PORTABLE ABDOMEN - 1 VIEW COMPARISON:  None. FINDINGS: There is no orogastric tube within the field of view. Dilated bowel noted in the central abdomen. IMPRESSION: No orogastric tube within the field of view. Electronically Signed   By: Ulyses Jarred M.D.   On: 14-Jan-2020 00:03    Pertinent labs & imaging results that were available during my care of the patient were reviewed by me and considered in my medical decision making (see chart for details).  Medications Ordered in ED Medications  sodium chloride 0.9 %  bolus 1,000 mL (has no administration in time range)  0.9 %  sodium chloride infusion (has no administration in time range)  propofol (DIPRIVAN) 1000 MG/100ML infusion (has no administration in time range)  fentaNYL (SUBLIMAZE) injection 50 mcg (has no administration in time range)  fentaNYL (SUBLIMAZE) injection 50 mcg (has no administration in time range)  etomidate (AMIDATE) injection 20 mg (has no administration in time range)  rocuronium (ZEMURON) injection 100 mg (has no administration in time range)  piperacillin-tazobactam (ZOSYN) IVPB 3.375 g (has no administration in time range)  norepinephrine (LEVOPHED) 4-5 MG/250ML-% infusion SOLN (has no administration in time range)                                                                                                                                    Procedures .1-3 Lead EKG Interpretation Performed by: Fatima Blank, MD Authorized by: Fatima Blank, MD     Interpretation: abnormal     ECG rate:  135   ECG rate assessment: tachycardic     Rhythm: atrial fibrillation     Ectopy: none     Conduction: normal   Procedure Name: Intubation Date/Time: 01/05/20 1:12 AM Performed by: Fatima Blank, MD Pre-anesthesia Checklist: Patient identified, Patient being monitored, Emergency Drugs available, Timeout performed and Suction  available Oxygen Delivery Method: Non-rebreather mask Preoxygenation: Pre-oxygenation with 100% oxygen Induction Type: Rapid sequence Ventilation: Mask ventilation without difficulty Laryngoscope Size: Glidescope Tube size: 8.0 mm Number of attempts: 1 Airway Equipment and Method: Rigid stylet Placement Confirmation: ETT inserted through vocal cords under direct vision,  CO2 detector and Breath sounds checked- equal and bilateral Secured at: 23 cm Tube secured with: ETT holder Difficulty Due To: Difficulty was anticipated    .Critical Care Performed by: Fatima Blank, MD Authorized by: Fatima Blank, MD   Critical care provider statement:    Critical care time (minutes):  45   Critical care start time:  12/30/2019 11:33 PM   Critical care end time:  01/05/2020 12:47 AM   Critical care was necessary to treat or prevent imminent or life-threatening deterioration of the following conditions:  Respiratory failure   Critical care was time spent personally by me on the following activities:  Discussions with consultants, evaluation of patient's response to treatment, examination of patient, ordering and performing treatments and interventions, ordering and review of laboratory studies, ordering and review of radiographic studies, pulse oximetry, re-evaluation of patient's condition, obtaining history from patient or surrogate and review of old charts CPR  Date/Time: January 05, 2020 12:35 AM Performed by: Fatima Blank, MD Authorized by: Fatima Blank, MD   Comments:     Cardiopulmonary Resuscitation (CPR) Procedure Note Directed/Performed by: Fatima Blank I personally directed ancillary staff and/or performed CPR in an effort to regain return of spontaneous circulation and to maintain cardiac, neuro and systemic perfusion.      (including critical care time)  Medical Decision Making / ED Course I have reviewed the nursing notes for this  encounter and the patient's prior records (if available in EHR or on provided paperwork).   Sandy Saric was evaluated in [facility] on 01/13/20 for the symptoms described in the history of present illness. She was evaluated in the context of the global COVID-19 pandemic, which necessitated consideration that the patient might be at risk for infection with the SARS-CoV-2 virus that causes COVID-19. Institutional protocols and algorithms that pertain to the evaluation of patients at risk for COVID-19 are in a state of rapid change based on information released by regulatory bodies including the CDC and federal and state organizations. These policies and algorithms were followed during the patient's care in the ED.  Patient presented in respiratory distress likely secondary to aspiration from volumous emesis at home.  Patient is lethargic and unresponsive.  Sats in the 60s on nonrebreather. Intubated to secure the airway. Chest x-ray with good placement of the ET tube.  Patient noted to be in A. fib RVR with stable blood pressures.  Suctioning performed and thick food particles were removed.  Labs and imaging ordered.  Approximately 20 minutes after intubation, patient's sats began to trend down and tidal volumes were low.  This is despite numerous attempts to suction.  Daughter was updated and allowed to come to bedside.  At this time the patient's saturations trended down to the 50s to 70s.  Her blood pressures were trending down from the 130s to the 80s.  The daughter requested we slow down for a minute.  She called her sister who was updated on the situation who mentioned that she was on her way in.  Patient saturations continue to trend down to the 40s and 50s.  Daughter agreed to exchange the ET tube and start the patient on pressors for the time being.  Patient's blood pressures have trended down to the 60s prior to starting pressors.  Her oxygen saturations were down in  the 40s.  ET tube was successfully exchanged from a 7.5-8.0 ET tube placement was verified with glide scope and visualized to the vocal cords.  Patient status continued to decline.  Patient became pulseless with heart rate in the 40s.  At this time daughter agreed to aggressive measures and CPR/ACLS was initiated.  After the initial round of ACLS the patient's other daughter arrived.  We continued to perform CPR and during this time family chose to withdraw any further resuscitative efforts.  Patient at this time was pulseless.  Bedside ultrasound did not reveal any cardiac activity.   Clinical Course as of January 13, 2020 0053  Thu 2020/01/13  0050 TOD 12:45a [PC]    Clinical Course User Index [PC] Emyah Roznowski, Grayce Sessions, MD   Chaplain was consulted.  Patient does not require medical examiner as this was due to natural causes.     Final Clinical Impression(s) / ED Diagnoses Final diagnoses:  Acute respiratory failure with hypoxia ([place])  Aspiration into lower respiratory tract, initial encounter      This chart was dictated using voice recognition software.  Despite best efforts to proofread,  errors can occur which can change the documentation meaning.   Fatima Blank, MD 01/13/20 0115    Fatima Blank, MD January 13, 2020 [phone]

## 2019-12-20 NOTE — Telephone Encounter (Signed)
The only concern with Mucinex D and Allegra-D is that they have a decongestant which can increase her blood pressure so they her best to avoid.  The Astelin nasal spray is okay-that is an antihistamine.

## 2019-12-21 DIAGNOSIS — J9601 Acute respiratory failure with hypoxia: Secondary | ICD-10-CM | POA: Diagnosis not present

## 2019-12-21 MED ORDER — ROCURONIUM BROMIDE 50 MG/5ML IV SOLN
INTRAVENOUS | Status: AC | PRN
  Administered 2019-12-20: 100 mg via INTRAVENOUS

## 2019-12-21 MED ORDER — ETOMIDATE 2 MG/ML IV SOLN
INTRAVENOUS | Status: AC | PRN
  Administered 2019-12-20: 20 mg via INTRAVENOUS

## 2019-12-21 MED ORDER — NOREPINEPHRINE 4 MG/250ML-% IV SOLN
INTRAVENOUS | Status: AC
  Filled 2019-12-21: qty 250

## 2019-12-21 MED ORDER — NOREPINEPHRINE 4 MG/250ML-% IV SOLN: 0.0000 ug/min | INTRAVENOUS | Status: DC

## 2019-12-21 MED ORDER — NOREPINEPHRINE BITARTRATE 1 MG/ML IV SOLN
INTRAVENOUS | Status: AC | PRN
Start: 2019-12-21 — End: 2019-12-21
  Administered 2019-12-21: 10 ug/kg/min via INTRAVENOUS

## 2019-12-21 MED ORDER — PROPOFOL 1000 MG/100ML IV EMUL
INTRAVENOUS | Status: AC | PRN
  Administered 2019-12-20: 11.494 ug/kg/min via INTRAVENOUS

## 2019-12-21 MED ORDER — EPINEPHRINE 1 MG/10ML IJ SOSY
PREFILLED_SYRINGE | INTRAMUSCULAR | Status: AC | PRN
  Administered 2019-12-21 (×2): 1 via INTRAVENOUS

## 2019-12-21 MED ORDER — SODIUM CHLORIDE 0.9 % IV SOLN
INTRAVENOUS | Status: AC | PRN
  Administered 2019-12-20: 1000 mL via INTRAVENOUS

## 2019-12-22 ENCOUNTER — Inpatient Hospital Stay: Payer: Medicare Other | Admitting: Internal Medicine

## 2019-12-25 MED FILL — Norepinephrine-Dextrose IV Solution 4 MG/250ML-5%: INTRAVENOUS | Qty: 250 | Status: AC

## 2019-12-27 ENCOUNTER — Other Ambulatory Visit: Payer: Self-pay | Admitting: Internal Medicine

## 2020-01-06 NOTE — Telephone Encounter (Signed)
Patient is deceased.

## 2020-01-06 NOTE — Progress Notes (Signed)
I was paged by the nurse to offer support to the member of the family. Their loved one passed. I spoke with them after they moved pt from ED_19 to ED-46. They stated that they didn't need me. I offered my condolences and sympathy.

## 2020-01-06 DEATH — deceased

## 2020-01-30 ENCOUNTER — Ambulatory Visit: Payer: Medicare Other | Admitting: Podiatry

## 2020-02-26 ENCOUNTER — Ambulatory Visit: Payer: Medicare Other | Admitting: Internal Medicine

## 2020-02-28 ENCOUNTER — Ambulatory Visit: Payer: Medicare Other | Admitting: Hematology and Oncology

## 2020-02-28 ENCOUNTER — Other Ambulatory Visit: Payer: Medicare Other

## 2020-04-10 ENCOUNTER — Other Ambulatory Visit: Payer: Medicare Other

## 2020-04-10 ENCOUNTER — Ambulatory Visit: Payer: Medicare Other | Admitting: Hematology and Oncology
